# Patient Record
Sex: Female | Born: 1945
Health system: Southern US, Community
[De-identification: ages and names within clinical notes are randomized; demographics above are authoritative.]

## PROBLEM LIST (undated history)

## (undated) DIAGNOSIS — Z5189 Encounter for other specified aftercare: Secondary | ICD-10-CM

## (undated) DIAGNOSIS — F32A Depression, unspecified: Secondary | ICD-10-CM

## (undated) DIAGNOSIS — M797 Fibromyalgia: Secondary | ICD-10-CM

## (undated) DIAGNOSIS — I889 Nonspecific lymphadenitis, unspecified: Secondary | ICD-10-CM

## (undated) DIAGNOSIS — I447 Left bundle-branch block, unspecified: Secondary | ICD-10-CM

## (undated) DIAGNOSIS — I2510 Atherosclerotic heart disease of native coronary artery without angina pectoris: Secondary | ICD-10-CM

## (undated) DIAGNOSIS — M545 Low back pain, unspecified: Secondary | ICD-10-CM

## (undated) DIAGNOSIS — M1990 Unspecified osteoarthritis, unspecified site: Secondary | ICD-10-CM

## (undated) DIAGNOSIS — I519 Heart disease, unspecified: Secondary | ICD-10-CM

## (undated) DIAGNOSIS — M719 Bursopathy, unspecified: Secondary | ICD-10-CM

## (undated) DIAGNOSIS — I5189 Other ill-defined heart diseases: Secondary | ICD-10-CM

## (undated) DIAGNOSIS — Z7901 Long term (current) use of anticoagulants: Secondary | ICD-10-CM

## (undated) DIAGNOSIS — M51369 Other intervertebral disc degeneration, lumbar region without mention of lumbar back pain or lower extremity pain: Secondary | ICD-10-CM

## (undated) DIAGNOSIS — R51 Headache: Secondary | ICD-10-CM

## (undated) DIAGNOSIS — I480 Paroxysmal atrial fibrillation: Secondary | ICD-10-CM

## (undated) DIAGNOSIS — I639 Cerebral infarction, unspecified: Secondary | ICD-10-CM

## (undated) DIAGNOSIS — K219 Gastro-esophageal reflux disease without esophagitis: Secondary | ICD-10-CM

## (undated) DIAGNOSIS — E876 Hypokalemia: Secondary | ICD-10-CM

## (undated) DIAGNOSIS — F329 Major depressive disorder, single episode, unspecified: Secondary | ICD-10-CM

## (undated) DIAGNOSIS — E785 Hyperlipidemia, unspecified: Secondary | ICD-10-CM

## (undated) DIAGNOSIS — I10 Essential (primary) hypertension: Secondary | ICD-10-CM

## (undated) DIAGNOSIS — M5136 Other intervertebral disc degeneration, lumbar region: Secondary | ICD-10-CM

## (undated) HISTORY — DX: Low back pain, unspecified: M54.50

## (undated) HISTORY — DX: Left bundle-branch block, unspecified: I44.7

## (undated) HISTORY — DX: Other intervertebral disc degeneration, lumbar region without mention of lumbar back pain or lower extremity pain: M51.369

## (undated) HISTORY — DX: Encounter for other specified aftercare: Z51.89

## (undated) HISTORY — DX: Low back pain: M54.5

## (undated) HISTORY — DX: Morbid (severe) obesity due to excess calories: E66.01

## (undated) HISTORY — PX: CHOLECYSTECTOMY: SHX55

## (undated) HISTORY — DX: Hyperlipidemia, unspecified: E78.5

## (undated) HISTORY — DX: Paroxysmal atrial fibrillation: I48.0

## (undated) HISTORY — DX: Headache: R51

## (undated) HISTORY — DX: Long term (current) use of anticoagulants: Z79.01

## (undated) HISTORY — DX: Essential (primary) hypertension: I10

## (undated) HISTORY — DX: Atherosclerotic heart disease of native coronary artery without angina pectoris: I25.10

## (undated) HISTORY — DX: Heart disease, unspecified: I51.9

## (undated) HISTORY — DX: Depression, unspecified: F32.A

## (undated) HISTORY — DX: Hypokalemia: E87.6

## (undated) HISTORY — DX: Other ill-defined heart diseases: I51.89

## (undated) HISTORY — DX: Other intervertebral disc degeneration, lumbar region: M51.36

## (undated) HISTORY — PX: KNEE ARTHROSCOPY: SUR90

## (undated) HISTORY — PX: ABDOMINAL HYSTERECTOMY: SHX81

## (undated) HISTORY — DX: Bursopathy, unspecified: M71.9

## (undated) HISTORY — PX: CORONARY ARTERY BYPASS GRAFT: SHX141

## (undated) HISTORY — DX: Fibromyalgia: M79.7

## (undated) HISTORY — DX: Major depressive disorder, single episode, unspecified: F32.9

## (undated) HISTORY — PX: SPHINCTEROTOMY: SHX5279

## (undated) HISTORY — DX: Unspecified osteoarthritis, unspecified site: M19.90

## (undated) HISTORY — DX: Nonspecific lymphadenitis, unspecified: I88.9

## (undated) HISTORY — PX: TONSILLECTOMY: SUR1361

## (undated) HISTORY — DX: Gastro-esophageal reflux disease without esophagitis: K21.9

## (undated) HISTORY — PX: ANGIOPLASTY: SHX39

## (undated) HISTORY — PX: LUMBAR LAMINECTOMY: SHX95

---

## 1997-06-23 ENCOUNTER — Ambulatory Visit (HOSPITAL_COMMUNITY): Admission: RE | Admit: 1997-06-23 | Discharge: 1997-06-23 | Payer: Self-pay | Admitting: Internal Medicine

## 1997-07-31 ENCOUNTER — Inpatient Hospital Stay (HOSPITAL_COMMUNITY): Admission: EM | Admit: 1997-07-31 | Discharge: 1997-08-02 | Payer: Self-pay | Admitting: Emergency Medicine

## 1997-10-02 ENCOUNTER — Ambulatory Visit: Admission: RE | Admit: 1997-10-02 | Discharge: 1997-10-02 | Payer: Self-pay | Admitting: Internal Medicine

## 1997-11-21 ENCOUNTER — Ambulatory Visit (HOSPITAL_COMMUNITY): Admission: RE | Admit: 1997-11-21 | Discharge: 1997-11-21 | Payer: Self-pay | Admitting: Neurosurgery

## 1997-11-21 ENCOUNTER — Encounter: Payer: Self-pay | Admitting: Neurosurgery

## 1997-11-21 ENCOUNTER — Ambulatory Visit (HOSPITAL_COMMUNITY): Admission: RE | Admit: 1997-11-21 | Discharge: 1997-11-21 | Payer: Self-pay | Admitting: Orthopedic Surgery

## 1997-12-12 ENCOUNTER — Ambulatory Visit (HOSPITAL_COMMUNITY): Admission: RE | Admit: 1997-12-12 | Discharge: 1997-12-12 | Payer: Self-pay | Admitting: Neurosurgery

## 1998-02-11 ENCOUNTER — Encounter: Payer: Self-pay | Admitting: Neurosurgery

## 1998-02-11 ENCOUNTER — Ambulatory Visit (HOSPITAL_COMMUNITY): Admission: RE | Admit: 1998-02-11 | Discharge: 1998-02-11 | Payer: Self-pay | Admitting: Neurosurgery

## 1998-03-19 ENCOUNTER — Encounter: Payer: Self-pay | Admitting: Neurosurgery

## 1998-03-19 ENCOUNTER — Ambulatory Visit (HOSPITAL_COMMUNITY): Admission: RE | Admit: 1998-03-19 | Discharge: 1998-03-19 | Payer: Self-pay | Admitting: Neurosurgery

## 1998-05-28 ENCOUNTER — Observation Stay (HOSPITAL_COMMUNITY): Admission: EM | Admit: 1998-05-28 | Discharge: 1998-05-29 | Payer: Self-pay | Admitting: Emergency Medicine

## 1998-05-28 ENCOUNTER — Encounter: Payer: Self-pay | Admitting: Emergency Medicine

## 1998-06-03 ENCOUNTER — Encounter: Payer: Self-pay | Admitting: Neurosurgery

## 1998-06-05 ENCOUNTER — Inpatient Hospital Stay (HOSPITAL_COMMUNITY): Admission: RE | Admit: 1998-06-05 | Discharge: 1998-06-08 | Payer: Self-pay | Admitting: Neurosurgery

## 1998-06-05 ENCOUNTER — Encounter: Payer: Self-pay | Admitting: Neurosurgery

## 1998-06-06 ENCOUNTER — Encounter: Payer: Self-pay | Admitting: Neurosurgery

## 1998-12-01 ENCOUNTER — Encounter: Admission: RE | Admit: 1998-12-01 | Discharge: 1999-01-25 | Payer: Self-pay | Admitting: Neurosurgery

## 1998-12-31 ENCOUNTER — Encounter: Admission: RE | Admit: 1998-12-31 | Discharge: 1998-12-31 | Payer: Self-pay | Admitting: Neurosurgery

## 1998-12-31 ENCOUNTER — Encounter: Payer: Self-pay | Admitting: Neurosurgery

## 1999-02-08 ENCOUNTER — Other Ambulatory Visit: Admission: RE | Admit: 1999-02-08 | Discharge: 1999-02-08 | Payer: Self-pay | Admitting: Internal Medicine

## 1999-06-16 ENCOUNTER — Encounter: Payer: Self-pay | Admitting: Neurosurgery

## 1999-06-16 ENCOUNTER — Ambulatory Visit (HOSPITAL_COMMUNITY): Admission: RE | Admit: 1999-06-16 | Discharge: 1999-06-16 | Payer: Self-pay | Admitting: Neurosurgery

## 1999-07-06 ENCOUNTER — Encounter: Payer: Self-pay | Admitting: Internal Medicine

## 1999-07-06 ENCOUNTER — Ambulatory Visit (HOSPITAL_COMMUNITY): Admission: RE | Admit: 1999-07-06 | Discharge: 1999-07-06 | Payer: Self-pay | Admitting: Neurosurgery

## 1999-07-06 ENCOUNTER — Encounter: Payer: Self-pay | Admitting: Neurosurgery

## 1999-07-06 ENCOUNTER — Ambulatory Visit (HOSPITAL_COMMUNITY): Admission: RE | Admit: 1999-07-06 | Discharge: 1999-07-06 | Payer: Self-pay | Admitting: Internal Medicine

## 1999-08-03 ENCOUNTER — Ambulatory Visit (HOSPITAL_COMMUNITY): Admission: RE | Admit: 1999-08-03 | Discharge: 1999-08-03 | Payer: Self-pay | Admitting: Neurosurgery

## 1999-08-19 ENCOUNTER — Ambulatory Visit (HOSPITAL_COMMUNITY): Admission: RE | Admit: 1999-08-19 | Discharge: 1999-08-19 | Payer: Self-pay | Admitting: Neurosurgery

## 1999-08-19 ENCOUNTER — Encounter: Payer: Self-pay | Admitting: Neurosurgery

## 1999-09-17 ENCOUNTER — Encounter: Payer: Self-pay | Admitting: Orthopedic Surgery

## 1999-09-17 ENCOUNTER — Ambulatory Visit (HOSPITAL_COMMUNITY): Admission: RE | Admit: 1999-09-17 | Discharge: 1999-09-17 | Payer: Self-pay | Admitting: Orthopedic Surgery

## 1999-11-06 ENCOUNTER — Encounter: Payer: Self-pay | Admitting: Emergency Medicine

## 1999-11-06 ENCOUNTER — Emergency Department (HOSPITAL_COMMUNITY): Admission: EM | Admit: 1999-11-06 | Discharge: 1999-11-06 | Payer: Self-pay | Admitting: Emergency Medicine

## 2000-01-04 ENCOUNTER — Emergency Department (HOSPITAL_COMMUNITY): Admission: EM | Admit: 2000-01-04 | Discharge: 2000-01-04 | Payer: Self-pay

## 2000-01-04 ENCOUNTER — Encounter: Payer: Self-pay | Admitting: Emergency Medicine

## 2000-04-27 ENCOUNTER — Emergency Department (HOSPITAL_COMMUNITY): Admission: EM | Admit: 2000-04-27 | Discharge: 2000-04-27 | Payer: Self-pay | Admitting: Emergency Medicine

## 2000-05-12 ENCOUNTER — Ambulatory Visit (HOSPITAL_COMMUNITY): Admission: RE | Admit: 2000-05-12 | Discharge: 2000-05-12 | Payer: Self-pay | Admitting: Anesthesiology

## 2000-05-12 ENCOUNTER — Encounter: Payer: Self-pay | Admitting: Anesthesiology

## 2000-07-14 ENCOUNTER — Emergency Department (HOSPITAL_COMMUNITY): Admission: EM | Admit: 2000-07-14 | Discharge: 2000-07-14 | Payer: Self-pay | Admitting: Emergency Medicine

## 2000-08-01 ENCOUNTER — Ambulatory Visit (HOSPITAL_COMMUNITY): Admission: RE | Admit: 2000-08-01 | Discharge: 2000-08-01 | Payer: Self-pay | Admitting: Family Medicine

## 2000-10-20 ENCOUNTER — Ambulatory Visit (HOSPITAL_COMMUNITY): Admission: RE | Admit: 2000-10-20 | Discharge: 2000-10-20 | Payer: Self-pay | Admitting: Otolaryngology

## 2000-10-20 ENCOUNTER — Encounter: Payer: Self-pay | Admitting: Otolaryngology

## 2000-11-21 ENCOUNTER — Ambulatory Visit (HOSPITAL_COMMUNITY): Admission: RE | Admit: 2000-11-21 | Discharge: 2000-11-21 | Payer: Self-pay | Admitting: Neurosurgery

## 2000-11-21 ENCOUNTER — Encounter: Payer: Self-pay | Admitting: Neurosurgery

## 2000-11-27 ENCOUNTER — Ambulatory Visit (HOSPITAL_BASED_OUTPATIENT_CLINIC_OR_DEPARTMENT_OTHER): Admission: RE | Admit: 2000-11-27 | Discharge: 2000-11-27 | Payer: Self-pay | Admitting: Orthopedic Surgery

## 2000-12-08 ENCOUNTER — Encounter: Admission: RE | Admit: 2000-12-08 | Discharge: 2001-01-12 | Payer: Self-pay | Admitting: Orthopedic Surgery

## 2001-06-04 ENCOUNTER — Emergency Department (HOSPITAL_COMMUNITY): Admission: EM | Admit: 2001-06-04 | Discharge: 2001-06-04 | Payer: Self-pay | Admitting: Emergency Medicine

## 2001-06-04 ENCOUNTER — Encounter: Payer: Self-pay | Admitting: Emergency Medicine

## 2001-06-07 ENCOUNTER — Inpatient Hospital Stay (HOSPITAL_COMMUNITY): Admission: AD | Admit: 2001-06-07 | Discharge: 2001-06-09 | Payer: Self-pay | Admitting: Cardiology

## 2001-06-07 ENCOUNTER — Encounter: Payer: Self-pay | Admitting: Cardiology

## 2001-10-01 ENCOUNTER — Encounter: Payer: Self-pay | Admitting: Emergency Medicine

## 2001-10-01 ENCOUNTER — Inpatient Hospital Stay (HOSPITAL_COMMUNITY): Admission: EM | Admit: 2001-10-01 | Discharge: 2001-10-03 | Payer: Self-pay | Admitting: Emergency Medicine

## 2001-10-24 ENCOUNTER — Inpatient Hospital Stay (HOSPITAL_COMMUNITY): Admission: AD | Admit: 2001-10-24 | Discharge: 2001-10-26 | Payer: Self-pay | Admitting: Cardiology

## 2001-10-24 ENCOUNTER — Encounter: Payer: Self-pay | Admitting: Cardiology

## 2001-12-03 ENCOUNTER — Encounter (HOSPITAL_COMMUNITY): Admission: RE | Admit: 2001-12-03 | Discharge: 2002-01-23 | Payer: Self-pay | Admitting: Cardiology

## 2002-03-07 DIAGNOSIS — I639 Cerebral infarction, unspecified: Secondary | ICD-10-CM

## 2002-03-07 HISTORY — DX: Cerebral infarction, unspecified: I63.9

## 2002-04-19 ENCOUNTER — Encounter: Payer: Self-pay | Admitting: Cardiology

## 2002-04-19 ENCOUNTER — Ambulatory Visit (HOSPITAL_COMMUNITY): Admission: RE | Admit: 2002-04-19 | Discharge: 2002-04-20 | Payer: Self-pay | Admitting: Cardiology

## 2002-05-03 ENCOUNTER — Inpatient Hospital Stay (HOSPITAL_COMMUNITY): Admission: RE | Admit: 2002-05-03 | Discharge: 2002-05-09 | Payer: Self-pay | Admitting: Surgery

## 2002-05-03 ENCOUNTER — Encounter: Payer: Self-pay | Admitting: Surgery

## 2002-05-04 ENCOUNTER — Encounter: Payer: Self-pay | Admitting: Surgery

## 2002-05-04 ENCOUNTER — Encounter: Payer: Self-pay | Admitting: Thoracic Surgery (Cardiothoracic Vascular Surgery)

## 2002-05-05 ENCOUNTER — Encounter: Payer: Self-pay | Admitting: Thoracic Surgery (Cardiothoracic Vascular Surgery)

## 2002-05-08 ENCOUNTER — Encounter: Payer: Self-pay | Admitting: Thoracic Surgery (Cardiothoracic Vascular Surgery)

## 2002-05-12 ENCOUNTER — Encounter: Payer: Self-pay | Admitting: Emergency Medicine

## 2002-05-12 ENCOUNTER — Emergency Department (HOSPITAL_COMMUNITY): Admission: EM | Admit: 2002-05-12 | Discharge: 2002-05-12 | Payer: Self-pay | Admitting: Emergency Medicine

## 2002-05-27 ENCOUNTER — Encounter (HOSPITAL_COMMUNITY): Admission: RE | Admit: 2002-05-27 | Discharge: 2002-07-10 | Payer: Self-pay | Admitting: Cardiology

## 2002-07-11 ENCOUNTER — Encounter (HOSPITAL_COMMUNITY): Admission: RE | Admit: 2002-07-11 | Discharge: 2002-08-11 | Payer: Self-pay | Admitting: Cardiology

## 2002-07-12 ENCOUNTER — Inpatient Hospital Stay (HOSPITAL_COMMUNITY): Admission: EM | Admit: 2002-07-12 | Discharge: 2002-07-18 | Payer: Self-pay | Admitting: Emergency Medicine

## 2002-07-12 ENCOUNTER — Encounter: Payer: Self-pay | Admitting: Emergency Medicine

## 2002-07-12 ENCOUNTER — Encounter: Payer: Self-pay | Admitting: Neurology

## 2002-07-12 ENCOUNTER — Encounter: Payer: Self-pay | Admitting: Cardiology

## 2002-07-13 ENCOUNTER — Encounter: Payer: Self-pay | Admitting: Neurology

## 2002-07-15 ENCOUNTER — Encounter: Payer: Self-pay | Admitting: Internal Medicine

## 2002-07-31 ENCOUNTER — Encounter: Admission: RE | Admit: 2002-07-31 | Discharge: 2002-10-29 | Payer: Self-pay | Admitting: Neurology

## 2002-08-01 ENCOUNTER — Emergency Department (HOSPITAL_COMMUNITY): Admission: EM | Admit: 2002-08-01 | Discharge: 2002-08-02 | Payer: Self-pay

## 2002-08-02 ENCOUNTER — Encounter: Payer: Self-pay | Admitting: Emergency Medicine

## 2002-08-13 ENCOUNTER — Encounter (HOSPITAL_COMMUNITY): Admission: RE | Admit: 2002-08-13 | Discharge: 2002-11-11 | Payer: Self-pay | Admitting: Cardiology

## 2002-09-30 ENCOUNTER — Ambulatory Visit (HOSPITAL_COMMUNITY): Admission: RE | Admit: 2002-09-30 | Discharge: 2002-09-30 | Payer: Self-pay | Admitting: Internal Medicine

## 2002-10-23 ENCOUNTER — Emergency Department (HOSPITAL_COMMUNITY): Admission: EM | Admit: 2002-10-23 | Discharge: 2002-10-24 | Payer: Self-pay | Admitting: Emergency Medicine

## 2002-10-23 ENCOUNTER — Encounter: Payer: Self-pay | Admitting: Emergency Medicine

## 2002-10-31 ENCOUNTER — Ambulatory Visit (HOSPITAL_COMMUNITY): Admission: RE | Admit: 2002-10-31 | Discharge: 2002-10-31 | Payer: Self-pay | Admitting: Neurology

## 2002-12-28 ENCOUNTER — Encounter: Payer: Self-pay | Admitting: Emergency Medicine

## 2002-12-28 ENCOUNTER — Emergency Department (HOSPITAL_COMMUNITY): Admission: EM | Admit: 2002-12-28 | Discharge: 2002-12-28 | Payer: Self-pay | Admitting: *Deleted

## 2003-03-29 ENCOUNTER — Emergency Department (HOSPITAL_COMMUNITY): Admission: EM | Admit: 2003-03-29 | Discharge: 2003-03-29 | Payer: Self-pay | Admitting: Emergency Medicine

## 2003-04-28 ENCOUNTER — Ambulatory Visit (HOSPITAL_COMMUNITY): Admission: RE | Admit: 2003-04-28 | Discharge: 2003-04-28 | Payer: Self-pay | Admitting: Cardiology

## 2003-10-22 ENCOUNTER — Inpatient Hospital Stay (HOSPITAL_COMMUNITY): Admission: AD | Admit: 2003-10-22 | Discharge: 2003-10-25 | Payer: Self-pay | Admitting: Cardiology

## 2003-10-23 ENCOUNTER — Encounter: Payer: Self-pay | Admitting: Internal Medicine

## 2003-10-23 DIAGNOSIS — K219 Gastro-esophageal reflux disease without esophagitis: Secondary | ICD-10-CM

## 2003-10-23 HISTORY — DX: Gastro-esophageal reflux disease without esophagitis: K21.9

## 2003-11-11 ENCOUNTER — Encounter: Admission: RE | Admit: 2003-11-11 | Discharge: 2003-11-11 | Payer: Self-pay | Admitting: Occupational Medicine

## 2003-11-20 ENCOUNTER — Emergency Department (HOSPITAL_COMMUNITY): Admission: EM | Admit: 2003-11-20 | Discharge: 2003-11-20 | Payer: Self-pay | Admitting: Emergency Medicine

## 2003-11-21 ENCOUNTER — Emergency Department (HOSPITAL_COMMUNITY): Admission: EM | Admit: 2003-11-21 | Discharge: 2003-11-21 | Payer: Self-pay | Admitting: Emergency Medicine

## 2003-11-28 ENCOUNTER — Observation Stay (HOSPITAL_COMMUNITY): Admission: EM | Admit: 2003-11-28 | Discharge: 2003-11-29 | Payer: Self-pay | Admitting: Emergency Medicine

## 2003-11-30 ENCOUNTER — Emergency Department (HOSPITAL_COMMUNITY): Admission: EM | Admit: 2003-11-30 | Discharge: 2003-11-30 | Payer: Self-pay | Admitting: Emergency Medicine

## 2003-12-10 ENCOUNTER — Encounter: Admission: RE | Admit: 2003-12-10 | Discharge: 2004-03-09 | Payer: Self-pay | Admitting: Occupational Medicine

## 2004-01-08 ENCOUNTER — Ambulatory Visit: Payer: Self-pay | Admitting: Internal Medicine

## 2004-01-08 ENCOUNTER — Ambulatory Visit: Payer: Self-pay | Admitting: Cardiology

## 2004-01-23 ENCOUNTER — Ambulatory Visit (HOSPITAL_COMMUNITY): Admission: RE | Admit: 2004-01-23 | Discharge: 2004-01-23 | Payer: Self-pay | Admitting: Cardiology

## 2004-01-23 ENCOUNTER — Emergency Department (HOSPITAL_COMMUNITY): Admission: EM | Admit: 2004-01-23 | Discharge: 2004-01-23 | Payer: Self-pay | Admitting: Emergency Medicine

## 2004-01-27 ENCOUNTER — Ambulatory Visit: Payer: Self-pay

## 2004-02-05 ENCOUNTER — Ambulatory Visit: Payer: Self-pay | Admitting: Internal Medicine

## 2004-02-12 ENCOUNTER — Ambulatory Visit: Payer: Self-pay | Admitting: Cardiology

## 2004-02-17 ENCOUNTER — Ambulatory Visit (HOSPITAL_COMMUNITY): Admission: RE | Admit: 2004-02-17 | Discharge: 2004-02-18 | Payer: Self-pay | Admitting: Orthopedic Surgery

## 2004-02-25 ENCOUNTER — Ambulatory Visit: Payer: Self-pay | Admitting: Internal Medicine

## 2004-02-26 ENCOUNTER — Ambulatory Visit: Payer: Self-pay | Admitting: Cardiology

## 2004-03-05 ENCOUNTER — Ambulatory Visit: Payer: Self-pay | Admitting: Internal Medicine

## 2004-03-08 ENCOUNTER — Encounter: Admission: RE | Admit: 2004-03-08 | Discharge: 2004-06-06 | Payer: Self-pay | Admitting: Orthopedic Surgery

## 2004-03-23 ENCOUNTER — Ambulatory Visit: Payer: Self-pay | Admitting: Internal Medicine

## 2004-03-24 ENCOUNTER — Ambulatory Visit: Payer: Self-pay | Admitting: Cardiology

## 2004-04-05 ENCOUNTER — Ambulatory Visit: Payer: Self-pay | Admitting: Internal Medicine

## 2004-04-21 ENCOUNTER — Ambulatory Visit: Payer: Self-pay | Admitting: Internal Medicine

## 2004-05-04 ENCOUNTER — Ambulatory Visit: Payer: Self-pay | Admitting: *Deleted

## 2004-05-21 ENCOUNTER — Ambulatory Visit: Payer: Self-pay | Admitting: Internal Medicine

## 2004-05-25 ENCOUNTER — Ambulatory Visit: Payer: Self-pay | Admitting: Cardiology

## 2004-05-27 ENCOUNTER — Ambulatory Visit: Payer: Self-pay | Admitting: Cardiology

## 2004-06-14 ENCOUNTER — Ambulatory Visit: Payer: Self-pay | Admitting: Cardiology

## 2004-06-23 ENCOUNTER — Ambulatory Visit: Payer: Self-pay | Admitting: Internal Medicine

## 2004-06-24 ENCOUNTER — Ambulatory Visit: Payer: Self-pay | Admitting: Cardiology

## 2004-07-02 ENCOUNTER — Ambulatory Visit: Payer: Self-pay | Admitting: Internal Medicine

## 2004-07-12 ENCOUNTER — Ambulatory Visit: Payer: Self-pay | Admitting: *Deleted

## 2004-08-03 ENCOUNTER — Ambulatory Visit: Payer: Self-pay | Admitting: *Deleted

## 2004-08-09 ENCOUNTER — Ambulatory Visit: Payer: Self-pay | Admitting: Cardiology

## 2004-08-10 ENCOUNTER — Ambulatory Visit: Payer: Self-pay | Admitting: Internal Medicine

## 2004-08-16 ENCOUNTER — Ambulatory Visit: Payer: Self-pay | Admitting: Cardiology

## 2004-08-17 ENCOUNTER — Ambulatory Visit: Payer: Self-pay | Admitting: Internal Medicine

## 2004-08-18 ENCOUNTER — Encounter: Admission: RE | Admit: 2004-08-18 | Discharge: 2004-08-18 | Payer: Self-pay | Admitting: Neurosurgery

## 2004-08-18 ENCOUNTER — Ambulatory Visit: Payer: Self-pay | Admitting: *Deleted

## 2004-08-21 ENCOUNTER — Ambulatory Visit (HOSPITAL_COMMUNITY): Admission: RE | Admit: 2004-08-21 | Discharge: 2004-08-21 | Payer: Self-pay | Admitting: Orthopedic Surgery

## 2004-08-25 ENCOUNTER — Ambulatory Visit: Payer: Self-pay | Admitting: *Deleted

## 2004-09-01 ENCOUNTER — Ambulatory Visit: Payer: Self-pay | Admitting: Cardiology

## 2004-09-03 ENCOUNTER — Ambulatory Visit: Payer: Self-pay | Admitting: Licensed Clinical Social Worker

## 2004-09-10 ENCOUNTER — Ambulatory Visit: Payer: Self-pay | Admitting: Cardiology

## 2004-09-13 ENCOUNTER — Ambulatory Visit: Payer: Self-pay | Admitting: Cardiology

## 2004-09-21 ENCOUNTER — Ambulatory Visit: Payer: Self-pay | Admitting: Cardiology

## 2004-09-21 ENCOUNTER — Ambulatory Visit: Payer: Self-pay

## 2004-09-24 ENCOUNTER — Ambulatory Visit: Payer: Self-pay | Admitting: Licensed Clinical Social Worker

## 2004-09-28 ENCOUNTER — Ambulatory Visit: Payer: Self-pay | Admitting: Cardiology

## 2004-10-12 ENCOUNTER — Ambulatory Visit: Payer: Self-pay | Admitting: Cardiology

## 2004-10-19 ENCOUNTER — Ambulatory Visit: Payer: Self-pay | Admitting: Cardiology

## 2004-10-21 ENCOUNTER — Ambulatory Visit: Payer: Self-pay | Admitting: Internal Medicine

## 2004-10-21 ENCOUNTER — Encounter: Admission: RE | Admit: 2004-10-21 | Discharge: 2004-10-21 | Payer: Self-pay | Admitting: Neurosurgery

## 2004-10-26 ENCOUNTER — Encounter: Admission: RE | Admit: 2004-10-26 | Discharge: 2004-10-26 | Payer: Self-pay | Admitting: Internal Medicine

## 2004-10-28 ENCOUNTER — Ambulatory Visit: Payer: Self-pay | Admitting: Cardiology

## 2004-11-03 ENCOUNTER — Ambulatory Visit: Payer: Self-pay | Admitting: *Deleted

## 2004-11-05 ENCOUNTER — Ambulatory Visit: Payer: Self-pay | Admitting: Cardiology

## 2004-11-05 ENCOUNTER — Encounter: Admission: RE | Admit: 2004-11-05 | Discharge: 2004-11-05 | Payer: Self-pay | Admitting: Neurosurgery

## 2004-11-09 ENCOUNTER — Ambulatory Visit: Payer: Self-pay | Admitting: Internal Medicine

## 2004-11-09 ENCOUNTER — Ambulatory Visit: Payer: Self-pay | Admitting: Licensed Clinical Social Worker

## 2004-11-11 ENCOUNTER — Ambulatory Visit: Payer: Self-pay | Admitting: Cardiology

## 2004-11-11 ENCOUNTER — Ambulatory Visit: Payer: Self-pay | Admitting: Internal Medicine

## 2004-11-19 ENCOUNTER — Ambulatory Visit: Payer: Self-pay | Admitting: Licensed Clinical Social Worker

## 2004-11-22 ENCOUNTER — Ambulatory Visit: Payer: Self-pay

## 2004-11-22 ENCOUNTER — Ambulatory Visit: Payer: Self-pay | Admitting: Cardiology

## 2004-11-26 ENCOUNTER — Ambulatory Visit: Payer: Self-pay | Admitting: Cardiology

## 2004-12-15 ENCOUNTER — Ambulatory Visit: Payer: Self-pay | Admitting: Cardiology

## 2005-01-11 ENCOUNTER — Ambulatory Visit: Payer: Self-pay | Admitting: Internal Medicine

## 2005-01-12 ENCOUNTER — Ambulatory Visit: Payer: Self-pay | Admitting: Cardiology

## 2005-02-10 ENCOUNTER — Ambulatory Visit: Payer: Self-pay | Admitting: Cardiology

## 2005-03-10 ENCOUNTER — Ambulatory Visit: Payer: Self-pay | Admitting: Internal Medicine

## 2005-03-15 ENCOUNTER — Ambulatory Visit: Payer: Self-pay | Admitting: Cardiology

## 2005-03-16 ENCOUNTER — Ambulatory Visit: Payer: Self-pay | Admitting: Internal Medicine

## 2005-03-17 ENCOUNTER — Ambulatory Visit: Payer: Self-pay | Admitting: Cardiology

## 2005-04-06 ENCOUNTER — Other Ambulatory Visit: Admission: RE | Admit: 2005-04-06 | Discharge: 2005-04-06 | Payer: Self-pay | Admitting: *Deleted

## 2005-04-07 ENCOUNTER — Ambulatory Visit: Payer: Self-pay | Admitting: Cardiology

## 2005-04-11 ENCOUNTER — Encounter: Admission: RE | Admit: 2005-04-11 | Discharge: 2005-04-11 | Payer: Self-pay | Admitting: *Deleted

## 2005-05-01 ENCOUNTER — Emergency Department (HOSPITAL_COMMUNITY): Admission: EM | Admit: 2005-05-01 | Discharge: 2005-05-01 | Payer: Self-pay | Admitting: Emergency Medicine

## 2005-05-03 ENCOUNTER — Ambulatory Visit: Payer: Self-pay | Admitting: Internal Medicine

## 2005-05-04 ENCOUNTER — Ambulatory Visit (HOSPITAL_COMMUNITY): Admission: RE | Admit: 2005-05-04 | Discharge: 2005-05-04 | Payer: Self-pay | Admitting: Internal Medicine

## 2005-05-05 ENCOUNTER — Ambulatory Visit: Payer: Self-pay | Admitting: Cardiology

## 2005-05-05 ENCOUNTER — Encounter: Admission: RE | Admit: 2005-05-05 | Discharge: 2005-05-05 | Payer: Self-pay | Admitting: Neurosurgery

## 2005-05-11 ENCOUNTER — Ambulatory Visit: Payer: Self-pay | Admitting: Internal Medicine

## 2005-05-13 ENCOUNTER — Ambulatory Visit: Payer: Self-pay | Admitting: Internal Medicine

## 2005-05-19 ENCOUNTER — Ambulatory Visit: Payer: Self-pay | Admitting: Cardiology

## 2005-06-02 ENCOUNTER — Ambulatory Visit: Payer: Self-pay | Admitting: Cardiology

## 2005-06-03 ENCOUNTER — Ambulatory Visit: Payer: Self-pay | Admitting: Cardiology

## 2005-06-13 ENCOUNTER — Ambulatory Visit: Payer: Self-pay | Admitting: Internal Medicine

## 2005-06-16 ENCOUNTER — Ambulatory Visit: Payer: Self-pay

## 2005-06-16 ENCOUNTER — Ambulatory Visit: Payer: Self-pay | Admitting: Internal Medicine

## 2005-06-23 ENCOUNTER — Ambulatory Visit (HOSPITAL_BASED_OUTPATIENT_CLINIC_OR_DEPARTMENT_OTHER): Admission: RE | Admit: 2005-06-23 | Discharge: 2005-06-23 | Payer: Self-pay | Admitting: Cardiology

## 2005-06-28 ENCOUNTER — Ambulatory Visit: Payer: Self-pay | Admitting: Cardiovascular Disease

## 2005-06-28 ENCOUNTER — Ambulatory Visit: Payer: Self-pay | Admitting: Cardiology

## 2005-07-06 ENCOUNTER — Encounter: Payer: Self-pay | Admitting: Neurosurgery

## 2005-07-12 ENCOUNTER — Ambulatory Visit: Payer: Self-pay | Admitting: Internal Medicine

## 2005-07-12 ENCOUNTER — Ambulatory Visit: Payer: Self-pay | Admitting: Pulmonary Disease

## 2005-07-19 ENCOUNTER — Ambulatory Visit: Payer: Self-pay | Admitting: Cardiology

## 2005-07-21 ENCOUNTER — Encounter: Admission: RE | Admit: 2005-07-21 | Discharge: 2005-07-21 | Payer: Self-pay | Admitting: Internal Medicine

## 2005-08-02 ENCOUNTER — Ambulatory Visit: Payer: Self-pay | Admitting: Internal Medicine

## 2005-08-16 ENCOUNTER — Ambulatory Visit: Payer: Self-pay | Admitting: *Deleted

## 2005-08-16 ENCOUNTER — Ambulatory Visit: Payer: Self-pay | Admitting: Internal Medicine

## 2005-09-06 ENCOUNTER — Ambulatory Visit: Payer: Self-pay | Admitting: Internal Medicine

## 2005-09-21 ENCOUNTER — Encounter: Admission: RE | Admit: 2005-09-21 | Discharge: 2005-09-21 | Payer: Self-pay | Admitting: General Surgery

## 2005-09-27 ENCOUNTER — Ambulatory Visit: Payer: Self-pay | Admitting: *Deleted

## 2005-09-27 ENCOUNTER — Ambulatory Visit: Payer: Self-pay | Admitting: Cardiology

## 2005-10-11 ENCOUNTER — Ambulatory Visit: Payer: Self-pay | Admitting: Cardiology

## 2005-11-01 ENCOUNTER — Ambulatory Visit: Payer: Self-pay | Admitting: Internal Medicine

## 2005-11-14 ENCOUNTER — Ambulatory Visit: Payer: Self-pay | Admitting: Internal Medicine

## 2005-12-20 ENCOUNTER — Ambulatory Visit: Payer: Self-pay | Admitting: Cardiology

## 2006-01-03 ENCOUNTER — Ambulatory Visit: Payer: Self-pay | Admitting: Cardiology

## 2006-01-06 ENCOUNTER — Ambulatory Visit: Payer: Self-pay | Admitting: Internal Medicine

## 2006-02-07 ENCOUNTER — Ambulatory Visit: Payer: Self-pay | Admitting: Cardiology

## 2006-02-21 ENCOUNTER — Ambulatory Visit: Payer: Self-pay | Admitting: Cardiology

## 2006-03-08 ENCOUNTER — Ambulatory Visit: Payer: Self-pay | Admitting: *Deleted

## 2006-03-21 ENCOUNTER — Ambulatory Visit: Payer: Self-pay | Admitting: Cardiology

## 2006-03-29 ENCOUNTER — Other Ambulatory Visit: Admission: RE | Admit: 2006-03-29 | Discharge: 2006-03-29 | Payer: Self-pay | Admitting: *Deleted

## 2006-04-05 ENCOUNTER — Ambulatory Visit: Payer: Self-pay | Admitting: Internal Medicine

## 2006-04-05 LAB — CONVERTED CEMR LAB
Creatinine, Ser: 0.9 mg/dL (ref 0.4–1.2)
Hgb A1c MFr Bld: 7.4 % — ABNORMAL HIGH (ref 4.6–6.0)
Potassium: 4.2 meq/L (ref 3.5–5.1)

## 2006-04-11 ENCOUNTER — Encounter: Admission: RE | Admit: 2006-04-11 | Discharge: 2006-04-11 | Payer: Self-pay | Admitting: *Deleted

## 2006-04-11 ENCOUNTER — Ambulatory Visit: Payer: Self-pay | Admitting: Cardiology

## 2006-04-17 LAB — HM DEXA SCAN: HM DEXA SCAN: NORMAL

## 2006-04-28 ENCOUNTER — Ambulatory Visit: Payer: Self-pay | Admitting: Internal Medicine

## 2006-05-22 ENCOUNTER — Ambulatory Visit: Payer: Self-pay | Admitting: Internal Medicine

## 2006-06-08 ENCOUNTER — Ambulatory Visit: Payer: Self-pay | Admitting: Internal Medicine

## 2006-07-13 ENCOUNTER — Ambulatory Visit: Payer: Self-pay | Admitting: Internal Medicine

## 2006-08-14 ENCOUNTER — Ambulatory Visit: Payer: Self-pay | Admitting: Internal Medicine

## 2006-08-14 ENCOUNTER — Ambulatory Visit: Payer: Self-pay | Admitting: Cardiology

## 2006-08-14 LAB — CONVERTED CEMR LAB
BUN: 21 mg/dL (ref 6–23)
Calcium: 9.4 mg/dL (ref 8.4–10.5)
Chloride: 103 meq/L (ref 96–112)
Creatinine, Ser: 0.8 mg/dL (ref 0.4–1.2)
GFR calc non Af Amer: 78 mL/min
INR: 2.9 — ABNORMAL HIGH (ref 0.9–2.0)
Potassium: 4 meq/L (ref 3.5–5.1)
Prothrombin Time: 21.6 s — ABNORMAL HIGH (ref 10.0–14.0)

## 2006-08-24 ENCOUNTER — Emergency Department (HOSPITAL_COMMUNITY): Admission: EM | Admit: 2006-08-24 | Discharge: 2006-08-25 | Payer: Self-pay | Admitting: *Deleted

## 2006-08-30 DIAGNOSIS — E1159 Type 2 diabetes mellitus with other circulatory complications: Secondary | ICD-10-CM | POA: Insufficient documentation

## 2006-08-30 DIAGNOSIS — I10 Essential (primary) hypertension: Secondary | ICD-10-CM

## 2006-09-02 ENCOUNTER — Ambulatory Visit: Payer: Self-pay | Admitting: *Deleted

## 2006-09-02 ENCOUNTER — Emergency Department (HOSPITAL_COMMUNITY): Admission: EM | Admit: 2006-09-02 | Discharge: 2006-09-03 | Payer: Self-pay | Admitting: Emergency Medicine

## 2006-09-13 ENCOUNTER — Ambulatory Visit: Payer: Self-pay | Admitting: Internal Medicine

## 2006-09-13 ENCOUNTER — Encounter: Payer: Self-pay | Admitting: Internal Medicine

## 2006-09-13 DIAGNOSIS — E1169 Type 2 diabetes mellitus with other specified complication: Secondary | ICD-10-CM | POA: Insufficient documentation

## 2006-09-13 DIAGNOSIS — K219 Gastro-esophageal reflux disease without esophagitis: Secondary | ICD-10-CM | POA: Insufficient documentation

## 2006-09-13 DIAGNOSIS — E785 Hyperlipidemia, unspecified: Secondary | ICD-10-CM

## 2006-09-13 DIAGNOSIS — I2510 Atherosclerotic heart disease of native coronary artery without angina pectoris: Secondary | ICD-10-CM

## 2006-09-13 DIAGNOSIS — I480 Paroxysmal atrial fibrillation: Secondary | ICD-10-CM | POA: Insufficient documentation

## 2006-09-15 ENCOUNTER — Ambulatory Visit: Payer: Self-pay | Admitting: Cardiology

## 2006-10-03 ENCOUNTER — Ambulatory Visit: Payer: Self-pay | Admitting: Internal Medicine

## 2006-10-03 DIAGNOSIS — M719 Bursopathy, unspecified: Secondary | ICD-10-CM

## 2006-10-03 DIAGNOSIS — M67919 Unspecified disorder of synovium and tendon, unspecified shoulder: Secondary | ICD-10-CM | POA: Insufficient documentation

## 2006-10-09 ENCOUNTER — Telehealth: Payer: Self-pay | Admitting: Internal Medicine

## 2006-10-17 ENCOUNTER — Ambulatory Visit: Payer: Self-pay | Admitting: Cardiology

## 2006-11-22 ENCOUNTER — Ambulatory Visit: Payer: Self-pay | Admitting: Internal Medicine

## 2006-11-22 DIAGNOSIS — M1990 Unspecified osteoarthritis, unspecified site: Secondary | ICD-10-CM

## 2006-11-22 LAB — CONVERTED CEMR LAB
BUN: 18 mg/dL (ref 6–23)
CO2: 27 meq/L (ref 19–32)
INR: 4.3
Prothrombin Time: 25 s
Sodium: 142 meq/L (ref 135–145)

## 2006-12-11 ENCOUNTER — Ambulatory Visit: Payer: Self-pay | Admitting: Internal Medicine

## 2006-12-20 ENCOUNTER — Telehealth: Payer: Self-pay | Admitting: Internal Medicine

## 2006-12-25 ENCOUNTER — Ambulatory Visit: Payer: Self-pay | Admitting: Internal Medicine

## 2006-12-29 ENCOUNTER — Telehealth: Payer: Self-pay | Admitting: *Deleted

## 2007-01-01 ENCOUNTER — Telehealth: Payer: Self-pay | Admitting: Internal Medicine

## 2007-01-02 ENCOUNTER — Telehealth: Payer: Self-pay | Admitting: *Deleted

## 2007-01-15 ENCOUNTER — Ambulatory Visit: Payer: Self-pay | Admitting: Internal Medicine

## 2007-01-18 ENCOUNTER — Telehealth (INDEPENDENT_AMBULATORY_CARE_PROVIDER_SITE_OTHER): Payer: Self-pay | Admitting: Family Medicine

## 2007-01-18 ENCOUNTER — Telehealth (INDEPENDENT_AMBULATORY_CARE_PROVIDER_SITE_OTHER): Payer: Self-pay | Admitting: *Deleted

## 2007-01-22 ENCOUNTER — Ambulatory Visit: Payer: Self-pay | Admitting: Internal Medicine

## 2007-01-22 LAB — CONVERTED CEMR LAB: VLDL: 24 mg/dL (ref 0–40)

## 2007-01-23 ENCOUNTER — Telehealth: Payer: Self-pay | Admitting: Internal Medicine

## 2007-01-25 ENCOUNTER — Encounter: Payer: Self-pay | Admitting: Internal Medicine

## 2007-01-29 ENCOUNTER — Ambulatory Visit: Payer: Self-pay | Admitting: Internal Medicine

## 2007-01-29 DIAGNOSIS — F329 Major depressive disorder, single episode, unspecified: Secondary | ICD-10-CM

## 2007-01-29 DIAGNOSIS — F068 Other specified mental disorders due to known physiological condition: Secondary | ICD-10-CM | POA: Insufficient documentation

## 2007-01-31 ENCOUNTER — Telehealth: Payer: Self-pay | Admitting: Internal Medicine

## 2007-02-02 ENCOUNTER — Telehealth: Payer: Self-pay | Admitting: Internal Medicine

## 2007-02-05 ENCOUNTER — Ambulatory Visit: Payer: Self-pay | Admitting: Internal Medicine

## 2007-02-05 ENCOUNTER — Telehealth: Payer: Self-pay | Admitting: Internal Medicine

## 2007-02-06 ENCOUNTER — Telehealth: Payer: Self-pay | Admitting: Internal Medicine

## 2007-02-09 ENCOUNTER — Telehealth: Payer: Self-pay | Admitting: *Deleted

## 2007-03-12 ENCOUNTER — Telehealth: Payer: Self-pay | Admitting: Internal Medicine

## 2007-03-30 ENCOUNTER — Ambulatory Visit: Payer: Self-pay | Admitting: Internal Medicine

## 2007-04-03 ENCOUNTER — Telehealth: Payer: Self-pay | Admitting: *Deleted

## 2007-04-04 ENCOUNTER — Ambulatory Visit: Payer: Self-pay | Admitting: Cardiology

## 2007-04-04 LAB — CONVERTED CEMR LAB
CO2: 26 meq/L (ref 19–32)
Chloride: 106 meq/L (ref 96–112)
Creatinine, Ser: 0.9 mg/dL (ref 0.4–1.2)
GFR calc Af Amer: 82 mL/min
GFR calc non Af Amer: 68 mL/min
Potassium: 4 meq/L (ref 3.5–5.1)

## 2007-04-10 ENCOUNTER — Telehealth (INDEPENDENT_AMBULATORY_CARE_PROVIDER_SITE_OTHER): Payer: Self-pay | Admitting: *Deleted

## 2007-04-23 ENCOUNTER — Telehealth: Payer: Self-pay | Admitting: Internal Medicine

## 2007-04-30 ENCOUNTER — Ambulatory Visit: Payer: Self-pay | Admitting: Internal Medicine

## 2007-04-30 DIAGNOSIS — R350 Frequency of micturition: Secondary | ICD-10-CM

## 2007-04-30 LAB — CONVERTED CEMR LAB: Hgb A1c MFr Bld: 8.3 % — ABNORMAL HIGH (ref 4.6–6.0)

## 2007-05-14 ENCOUNTER — Telehealth: Payer: Self-pay | Admitting: Internal Medicine

## 2007-05-15 ENCOUNTER — Telehealth: Payer: Self-pay | Admitting: Internal Medicine

## 2007-05-23 ENCOUNTER — Telehealth: Payer: Self-pay | Admitting: Internal Medicine

## 2007-06-26 ENCOUNTER — Ambulatory Visit: Payer: Self-pay | Admitting: Cardiology

## 2007-06-27 ENCOUNTER — Telehealth: Payer: Self-pay | Admitting: Internal Medicine

## 2007-07-02 ENCOUNTER — Ambulatory Visit: Payer: Self-pay | Admitting: Internal Medicine

## 2007-07-12 ENCOUNTER — Telehealth: Payer: Self-pay | Admitting: Internal Medicine

## 2007-07-17 ENCOUNTER — Telehealth (INDEPENDENT_AMBULATORY_CARE_PROVIDER_SITE_OTHER): Payer: Self-pay | Admitting: *Deleted

## 2007-07-23 ENCOUNTER — Encounter: Payer: Self-pay | Admitting: Internal Medicine

## 2007-07-23 DIAGNOSIS — I881 Chronic lymphadenitis, except mesenteric: Secondary | ICD-10-CM

## 2007-07-31 ENCOUNTER — Telehealth: Payer: Self-pay | Admitting: Internal Medicine

## 2007-08-02 ENCOUNTER — Telehealth: Payer: Self-pay | Admitting: *Deleted

## 2007-08-06 ENCOUNTER — Ambulatory Visit: Payer: Self-pay | Admitting: Cardiovascular Disease

## 2007-08-27 ENCOUNTER — Ambulatory Visit: Payer: Self-pay | Admitting: Internal Medicine

## 2007-08-29 ENCOUNTER — Ambulatory Visit: Payer: Self-pay | Admitting: Internal Medicine

## 2007-08-29 LAB — CONVERTED CEMR LAB
CO2: 25 meq/L (ref 19–32)
Chloride: 104 meq/L (ref 96–112)
Creatinine, Ser: 0.9 mg/dL (ref 0.4–1.2)
Eosinophils Absolute: 0.1 10*3/uL (ref 0.0–0.7)
Eosinophils Relative: 2.2 % (ref 0.0–5.0)
GFR calc Af Amer: 82 mL/min
GFR calc non Af Amer: 68 mL/min
Glucose, Bld: 134 mg/dL — ABNORMAL HIGH (ref 70–99)
HCT: 40.1 % (ref 36.0–46.0)
Lymphocytes Relative: 50.7 % — ABNORMAL HIGH (ref 12.0–46.0)
Neutro Abs: 2 10*3/uL (ref 1.4–7.7)
Neutrophils Relative %: 33.3 % — ABNORMAL LOW (ref 43.0–77.0)
Potassium: 4 meq/L (ref 3.5–5.1)
RDW: 13.3 % (ref 11.5–14.6)
WBC: 6 10*3/uL (ref 4.5–10.5)

## 2007-09-17 ENCOUNTER — Ambulatory Visit: Payer: Self-pay | Admitting: Cardiology

## 2007-09-19 ENCOUNTER — Ambulatory Visit: Payer: Self-pay | Admitting: Internal Medicine

## 2007-09-19 DIAGNOSIS — M940 Chondrocostal junction syndrome [Tietze]: Secondary | ICD-10-CM

## 2007-09-26 ENCOUNTER — Telehealth: Payer: Self-pay | Admitting: Internal Medicine

## 2007-10-01 ENCOUNTER — Telehealth: Payer: Self-pay | Admitting: Internal Medicine

## 2007-10-01 DIAGNOSIS — M25569 Pain in unspecified knee: Secondary | ICD-10-CM

## 2007-10-02 ENCOUNTER — Encounter: Payer: Self-pay | Admitting: Internal Medicine

## 2007-10-02 ENCOUNTER — Ambulatory Visit: Payer: Self-pay

## 2007-10-08 ENCOUNTER — Ambulatory Visit: Payer: Self-pay | Admitting: Cardiology

## 2007-10-15 ENCOUNTER — Telehealth: Payer: Self-pay | Admitting: Internal Medicine

## 2007-10-30 ENCOUNTER — Ambulatory Visit: Payer: Self-pay | Admitting: Internal Medicine

## 2007-10-31 ENCOUNTER — Ambulatory Visit: Payer: Self-pay | Admitting: Cardiology

## 2007-11-05 ENCOUNTER — Ambulatory Visit: Payer: Self-pay | Admitting: Cardiology

## 2007-11-05 LAB — CONVERTED CEMR LAB
BUN: 21 mg/dL (ref 6–23)
CO2: 27 meq/L (ref 19–32)
Creatinine, Ser: 1.1 mg/dL (ref 0.4–1.2)
GFR calc Af Amer: 65 mL/min
GFR calc non Af Amer: 54 mL/min
Sodium: 141 meq/L (ref 135–145)

## 2007-11-09 ENCOUNTER — Telehealth: Payer: Self-pay | Admitting: Internal Medicine

## 2007-11-19 ENCOUNTER — Ambulatory Visit: Payer: Self-pay | Admitting: Cardiology

## 2007-11-27 ENCOUNTER — Telehealth: Payer: Self-pay | Admitting: Internal Medicine

## 2007-12-03 ENCOUNTER — Encounter: Admission: RE | Admit: 2007-12-03 | Discharge: 2007-12-03 | Payer: Self-pay | Admitting: Internal Medicine

## 2007-12-03 ENCOUNTER — Ambulatory Visit: Payer: Self-pay | Admitting: Cardiovascular Disease

## 2007-12-13 ENCOUNTER — Telehealth: Payer: Self-pay | Admitting: Internal Medicine

## 2007-12-17 ENCOUNTER — Encounter: Admission: RE | Admit: 2007-12-17 | Discharge: 2007-12-17 | Payer: Self-pay | Admitting: Internal Medicine

## 2007-12-17 ENCOUNTER — Ambulatory Visit: Payer: Self-pay | Admitting: Cardiology

## 2007-12-17 LAB — HM MAMMOGRAPHY

## 2007-12-18 ENCOUNTER — Telehealth: Payer: Self-pay | Admitting: Internal Medicine

## 2007-12-19 ENCOUNTER — Encounter: Payer: Self-pay | Admitting: Cardiology

## 2007-12-19 ENCOUNTER — Ambulatory Visit: Payer: Self-pay

## 2007-12-19 LAB — CONVERTED CEMR LAB
ALT: 29 units/L (ref 0–35)
Bilirubin, Direct: 0.1 mg/dL (ref 0.0–0.3)
CO2: 27 meq/L (ref 19–32)
Calcium: 9.2 mg/dL (ref 8.4–10.5)
Chloride: 102 meq/L (ref 96–112)
Cholesterol: 190 mg/dL (ref 0–200)
Creatinine, Ser: 0.9 mg/dL (ref 0.4–1.2)
LDL Cholesterol: 133 mg/dL — ABNORMAL HIGH (ref 0–99)
Triglycerides: 128 mg/dL (ref 0–149)
VLDL: 26 mg/dL (ref 0–40)

## 2007-12-20 ENCOUNTER — Telehealth: Payer: Self-pay | Admitting: Internal Medicine

## 2007-12-26 ENCOUNTER — Ambulatory Visit: Payer: Self-pay | Admitting: Internal Medicine

## 2008-01-07 ENCOUNTER — Ambulatory Visit: Payer: Self-pay | Admitting: Cardiovascular Disease

## 2008-01-14 ENCOUNTER — Telehealth: Payer: Self-pay | Admitting: Internal Medicine

## 2008-01-27 ENCOUNTER — Ambulatory Visit: Payer: Self-pay | Admitting: Cardiology

## 2008-01-27 ENCOUNTER — Inpatient Hospital Stay (HOSPITAL_COMMUNITY): Admission: EM | Admit: 2008-01-27 | Discharge: 2008-01-29 | Payer: Self-pay | Admitting: *Deleted

## 2008-01-27 ENCOUNTER — Ambulatory Visit: Payer: Self-pay | Admitting: *Deleted

## 2008-01-28 ENCOUNTER — Encounter: Payer: Self-pay | Admitting: Cardiology

## 2008-01-30 ENCOUNTER — Telehealth: Payer: Self-pay | Admitting: Family Medicine

## 2008-02-04 ENCOUNTER — Ambulatory Visit: Payer: Self-pay | Admitting: Cardiovascular Disease

## 2008-02-04 LAB — CONVERTED CEMR LAB
BUN: 15 mg/dL (ref 6–23)
CO2: 26 meq/L (ref 19–32)
Calcium: 8.8 mg/dL (ref 8.4–10.5)
Creatinine, Ser: 0.8 mg/dL (ref 0.4–1.2)
GFR calc non Af Amer: 78 mL/min
Potassium: 3.9 meq/L (ref 3.5–5.1)

## 2008-02-06 ENCOUNTER — Ambulatory Visit: Payer: Self-pay | Admitting: Internal Medicine

## 2008-02-06 DIAGNOSIS — R519 Headache, unspecified: Secondary | ICD-10-CM | POA: Insufficient documentation

## 2008-02-06 DIAGNOSIS — R51 Headache: Secondary | ICD-10-CM

## 2008-02-11 ENCOUNTER — Ambulatory Visit: Payer: Self-pay | Admitting: Cardiology

## 2008-02-15 ENCOUNTER — Telehealth: Payer: Self-pay | Admitting: Internal Medicine

## 2008-02-15 ENCOUNTER — Ambulatory Visit: Payer: Self-pay | Admitting: Cardiovascular Disease

## 2008-03-03 ENCOUNTER — Ambulatory Visit: Payer: Self-pay | Admitting: Cardiology

## 2008-03-05 ENCOUNTER — Telehealth: Payer: Self-pay | Admitting: Internal Medicine

## 2008-03-06 ENCOUNTER — Telehealth: Payer: Self-pay | Admitting: Internal Medicine

## 2008-03-08 ENCOUNTER — Telehealth: Payer: Self-pay | Admitting: Internal Medicine

## 2008-03-19 ENCOUNTER — Ambulatory Visit: Payer: Self-pay | Admitting: Internal Medicine

## 2008-03-19 ENCOUNTER — Ambulatory Visit: Payer: Self-pay | Admitting: Cardiology

## 2008-03-19 DIAGNOSIS — I447 Left bundle-branch block, unspecified: Secondary | ICD-10-CM

## 2008-03-19 LAB — CONVERTED CEMR LAB
Chloride: 102 meq/L (ref 96–112)
Creatinine, Ser: 0.7 mg/dL (ref 0.4–1.2)
GFR calc non Af Amer: 90 mL/min
Glucose, Bld: 151 mg/dL — ABNORMAL HIGH (ref 70–99)
Microalb Creat Ratio: 3.2 mg/g (ref 0.0–30.0)
Microalb, Ur: 0.2 mg/dL (ref 0.0–1.9)
Sodium: 140 meq/L (ref 135–145)

## 2008-03-20 ENCOUNTER — Telehealth: Payer: Self-pay | Admitting: Internal Medicine

## 2008-03-27 ENCOUNTER — Telehealth: Payer: Self-pay | Admitting: Internal Medicine

## 2008-04-02 ENCOUNTER — Emergency Department (HOSPITAL_COMMUNITY): Admission: EM | Admit: 2008-04-02 | Discharge: 2008-04-02 | Payer: Self-pay | Admitting: Emergency Medicine

## 2008-04-04 ENCOUNTER — Ambulatory Visit: Payer: Self-pay | Admitting: Cardiology

## 2008-04-04 ENCOUNTER — Ambulatory Visit (HOSPITAL_COMMUNITY): Admission: RE | Admit: 2008-04-04 | Discharge: 2008-04-04 | Payer: Self-pay | Admitting: Orthopedic Surgery

## 2008-04-07 ENCOUNTER — Ambulatory Visit: Payer: Self-pay | Admitting: Cardiology

## 2008-04-16 ENCOUNTER — Ambulatory Visit: Payer: Self-pay | Admitting: Cardiology

## 2008-04-22 ENCOUNTER — Telehealth: Payer: Self-pay | Admitting: Internal Medicine

## 2008-04-23 ENCOUNTER — Ambulatory Visit: Payer: Self-pay | Admitting: Internal Medicine

## 2008-04-23 LAB — CONVERTED CEMR LAB: Urobilinogen, UA: 1

## 2008-05-07 ENCOUNTER — Ambulatory Visit: Payer: Self-pay | Admitting: Internal Medicine

## 2008-05-08 ENCOUNTER — Telehealth: Payer: Self-pay | Admitting: Internal Medicine

## 2008-05-09 ENCOUNTER — Telehealth: Payer: Self-pay | Admitting: Internal Medicine

## 2008-05-12 ENCOUNTER — Ambulatory Visit: Payer: Self-pay | Admitting: Internal Medicine

## 2008-05-12 DIAGNOSIS — M545 Low back pain: Secondary | ICD-10-CM

## 2008-05-14 ENCOUNTER — Ambulatory Visit (HOSPITAL_COMMUNITY): Admission: RE | Admit: 2008-05-14 | Discharge: 2008-05-14 | Payer: Self-pay | Admitting: Internal Medicine

## 2008-05-20 ENCOUNTER — Telehealth: Payer: Self-pay | Admitting: Internal Medicine

## 2008-05-31 ENCOUNTER — Encounter: Payer: Self-pay | Admitting: Internal Medicine

## 2008-06-03 ENCOUNTER — Emergency Department (HOSPITAL_COMMUNITY): Admission: EM | Admit: 2008-06-03 | Discharge: 2008-06-03 | Payer: Self-pay | Admitting: Emergency Medicine

## 2008-06-05 ENCOUNTER — Telehealth: Payer: Self-pay | Admitting: Internal Medicine

## 2008-06-10 ENCOUNTER — Ambulatory Visit: Payer: Self-pay | Admitting: Internal Medicine

## 2008-06-10 LAB — CONVERTED CEMR LAB
Eosinophils Relative: 3.1 % (ref 0.0–5.0)
HCT: 39.6 % (ref 36.0–46.0)
HDL: 28.8 mg/dL — ABNORMAL LOW (ref >39.00)
Lymphocytes Relative: 49.1 % — ABNORMAL HIGH (ref 12.0–46.0)
MCHC: 34.1 g/dL (ref 30.0–36.0)
MCV: 92.5 fL (ref 78.0–100.0)
Monocytes Absolute: 0.6 10*3/uL (ref 0.1–1.0)
Monocytes Relative: 11.2 % (ref 3.0–12.0)
Neutro Abs: 2 10*3/uL (ref 1.4–7.7)
Neutrophils Relative %: 36.2 % — ABNORMAL LOW (ref 43.0–77.0)
Platelets: 251 10*3/uL (ref 150.0–400.0)
RBC: 4.28 M/uL (ref 3.87–5.11)
RDW: 13.3 % (ref 11.5–14.6)

## 2008-06-11 ENCOUNTER — Ambulatory Visit: Payer: Self-pay | Admitting: Cardiology

## 2008-06-11 ENCOUNTER — Encounter: Payer: Self-pay | Admitting: Cardiology

## 2008-06-19 ENCOUNTER — Telehealth: Payer: Self-pay | Admitting: Internal Medicine

## 2008-07-02 ENCOUNTER — Telehealth: Payer: Self-pay | Admitting: Internal Medicine

## 2008-07-08 ENCOUNTER — Ambulatory Visit: Payer: Self-pay | Admitting: Internal Medicine

## 2008-08-04 ENCOUNTER — Emergency Department (HOSPITAL_COMMUNITY): Admission: EM | Admit: 2008-08-04 | Discharge: 2008-08-05 | Payer: Self-pay | Admitting: Emergency Medicine

## 2008-08-05 ENCOUNTER — Telehealth (INDEPENDENT_AMBULATORY_CARE_PROVIDER_SITE_OTHER): Payer: Self-pay | Admitting: *Deleted

## 2008-08-05 ENCOUNTER — Telehealth: Payer: Self-pay | Admitting: Cardiology

## 2008-08-05 ENCOUNTER — Telehealth: Payer: Self-pay | Admitting: Internal Medicine

## 2008-08-05 ENCOUNTER — Telehealth (INDEPENDENT_AMBULATORY_CARE_PROVIDER_SITE_OTHER): Payer: Self-pay | Admitting: Cardiology

## 2008-08-05 ENCOUNTER — Encounter: Payer: Self-pay | Admitting: *Deleted

## 2008-08-07 ENCOUNTER — Encounter: Payer: Self-pay | Admitting: Internal Medicine

## 2008-08-20 ENCOUNTER — Ambulatory Visit: Payer: Self-pay | Admitting: Internal Medicine

## 2008-08-27 ENCOUNTER — Telehealth (INDEPENDENT_AMBULATORY_CARE_PROVIDER_SITE_OTHER): Payer: Self-pay | Admitting: *Deleted

## 2008-09-10 ENCOUNTER — Encounter: Payer: Self-pay | Admitting: *Deleted

## 2008-09-25 ENCOUNTER — Ambulatory Visit: Payer: Self-pay | Admitting: Internal Medicine

## 2008-09-25 LAB — CONVERTED CEMR LAB
CO2: 27 meq/L (ref 19–32)
Chloride: 104 meq/L (ref 96–112)
Glucose, Bld: 104 mg/dL — ABNORMAL HIGH (ref 70–99)
HDL: 29.7 mg/dL — ABNORMAL LOW (ref >39.00)

## 2008-10-15 ENCOUNTER — Encounter (INDEPENDENT_AMBULATORY_CARE_PROVIDER_SITE_OTHER): Payer: Self-pay | Admitting: *Deleted

## 2008-10-15 ENCOUNTER — Encounter: Payer: Self-pay | Admitting: Cardiology

## 2008-10-23 ENCOUNTER — Ambulatory Visit: Payer: Self-pay | Admitting: Cardiology

## 2008-10-23 LAB — CONVERTED CEMR LAB: POC INR: 1.8

## 2008-11-17 ENCOUNTER — Encounter (INDEPENDENT_AMBULATORY_CARE_PROVIDER_SITE_OTHER): Payer: Self-pay | Admitting: *Deleted

## 2008-11-24 ENCOUNTER — Ambulatory Visit: Payer: Self-pay | Admitting: Cardiology

## 2008-11-28 ENCOUNTER — Ambulatory Visit: Payer: Self-pay | Admitting: Internal Medicine

## 2008-11-28 LAB — CONVERTED CEMR LAB
Calcium: 9.4 mg/dL (ref 8.4–10.5)
Creatinine, Ser: 0.8 mg/dL (ref 0.4–1.2)
Direct LDL: 116 mg/dL
Hgb A1c MFr Bld: 8.1 % — ABNORMAL HIGH (ref 4.6–6.5)
Potassium: 3.5 meq/L (ref 3.5–5.1)
Sodium: 140 meq/L (ref 135–145)

## 2008-12-29 ENCOUNTER — Telehealth: Payer: Self-pay | Admitting: Internal Medicine

## 2009-01-05 ENCOUNTER — Encounter (INDEPENDENT_AMBULATORY_CARE_PROVIDER_SITE_OTHER): Payer: Self-pay | Admitting: *Deleted

## 2009-01-07 ENCOUNTER — Ambulatory Visit: Payer: Self-pay | Admitting: Cardiology

## 2009-01-14 ENCOUNTER — Ambulatory Visit: Payer: Self-pay | Admitting: Family Medicine

## 2009-01-14 DIAGNOSIS — J209 Acute bronchitis, unspecified: Secondary | ICD-10-CM

## 2009-01-15 ENCOUNTER — Ambulatory Visit: Payer: Self-pay | Admitting: Internal Medicine

## 2009-01-15 LAB — CONVERTED CEMR LAB: POC INR: 1.5

## 2009-01-20 ENCOUNTER — Telehealth: Payer: Self-pay | Admitting: Cardiology

## 2009-01-21 LAB — CONVERTED CEMR LAB
BUN: 14 mg/dL (ref 6–23)
CO2: 27 meq/L (ref 19–32)
Calcium: 8.9 mg/dL (ref 8.4–10.5)
Chloride: 102 meq/L (ref 96–112)
GFR calc non Af Amer: 81.58 mL/min (ref >60)
Glucose, Bld: 164 mg/dL — ABNORMAL HIGH (ref 70–99)

## 2009-01-26 ENCOUNTER — Ambulatory Visit: Payer: Self-pay | Admitting: Cardiology

## 2009-02-02 ENCOUNTER — Ambulatory Visit: Payer: Self-pay | Admitting: Internal Medicine

## 2009-02-02 DIAGNOSIS — E876 Hypokalemia: Secondary | ICD-10-CM

## 2009-02-02 LAB — CONVERTED CEMR LAB
Chloride: 102 meq/L (ref 96–112)
Creatinine, Ser: 0.8 mg/dL (ref 0.4–1.2)
Hgb A1c MFr Bld: 7.6 % — ABNORMAL HIGH (ref 4.6–6.5)
Potassium: 3.6 meq/L (ref 3.5–5.1)

## 2009-02-09 ENCOUNTER — Telehealth: Payer: Self-pay | Admitting: Internal Medicine

## 2009-02-18 ENCOUNTER — Ambulatory Visit: Payer: Self-pay | Admitting: Cardiology

## 2009-02-18 LAB — CONVERTED CEMR LAB: POC INR: 2.6

## 2009-03-05 ENCOUNTER — Telehealth: Payer: Self-pay | Admitting: Cardiology

## 2009-04-10 ENCOUNTER — Ambulatory Visit: Payer: Self-pay | Admitting: Internal Medicine

## 2009-04-10 LAB — CONVERTED CEMR LAB: POC INR: 3.2

## 2009-04-22 ENCOUNTER — Telehealth: Payer: Self-pay | Admitting: *Deleted

## 2009-04-24 ENCOUNTER — Ambulatory Visit: Payer: Self-pay | Admitting: Internal Medicine

## 2009-05-12 ENCOUNTER — Telehealth: Payer: Self-pay | Admitting: Cardiology

## 2009-05-13 ENCOUNTER — Ambulatory Visit: Payer: Self-pay | Admitting: Cardiovascular Disease

## 2009-05-13 LAB — CONVERTED CEMR LAB: POC INR: 3.4

## 2009-05-25 ENCOUNTER — Ambulatory Visit: Payer: Self-pay | Admitting: Internal Medicine

## 2009-06-05 ENCOUNTER — Telehealth: Payer: Self-pay | Admitting: Internal Medicine

## 2009-07-08 ENCOUNTER — Ambulatory Visit: Payer: Self-pay | Admitting: Internal Medicine

## 2009-07-16 ENCOUNTER — Ambulatory Visit: Payer: Self-pay | Admitting: Cardiology

## 2009-07-16 ENCOUNTER — Ambulatory Visit: Payer: Self-pay | Admitting: Internal Medicine

## 2009-08-11 ENCOUNTER — Telehealth: Payer: Self-pay | Admitting: Internal Medicine

## 2009-08-11 DIAGNOSIS — R079 Chest pain, unspecified: Secondary | ICD-10-CM

## 2009-08-14 ENCOUNTER — Ambulatory Visit: Payer: Self-pay | Admitting: Cardiovascular Disease

## 2009-08-19 ENCOUNTER — Telehealth: Payer: Self-pay | Admitting: Cardiology

## 2009-08-21 ENCOUNTER — Ambulatory Visit: Payer: Self-pay | Admitting: Cardiology

## 2009-09-10 LAB — CONVERTED CEMR LAB
Alkaline Phosphatase: 58 units/L (ref 39–117)
Bilirubin, Direct: 0.1 mg/dL (ref 0.0–0.3)
Cholesterol: 121 mg/dL (ref 0–200)
HDL: 32.8 mg/dL — ABNORMAL LOW (ref >39.00)
Total Protein: 7.1 g/dL (ref 6.0–8.3)

## 2009-09-14 ENCOUNTER — Ambulatory Visit: Payer: Self-pay | Admitting: Internal Medicine

## 2009-09-22 ENCOUNTER — Telehealth: Payer: Self-pay | Admitting: Internal Medicine

## 2009-09-23 ENCOUNTER — Ambulatory Visit: Payer: Self-pay | Admitting: Internal Medicine

## 2009-09-29 ENCOUNTER — Telehealth: Payer: Self-pay | Admitting: Internal Medicine

## 2009-09-29 DIAGNOSIS — M25519 Pain in unspecified shoulder: Secondary | ICD-10-CM

## 2009-09-30 ENCOUNTER — Ambulatory Visit (HOSPITAL_COMMUNITY): Admission: RE | Admit: 2009-09-30 | Discharge: 2009-09-30 | Payer: Self-pay | Admitting: Internal Medicine

## 2009-10-02 ENCOUNTER — Ambulatory Visit: Payer: Self-pay | Admitting: Cardiology

## 2009-10-02 LAB — CONVERTED CEMR LAB: POC INR: 2.1

## 2009-10-08 ENCOUNTER — Telehealth: Payer: Self-pay | Admitting: Internal Medicine

## 2009-10-30 ENCOUNTER — Ambulatory Visit: Payer: Self-pay | Admitting: Internal Medicine

## 2009-10-30 DIAGNOSIS — M5412 Radiculopathy, cervical region: Secondary | ICD-10-CM

## 2009-11-03 LAB — CONVERTED CEMR LAB
BUN: 14 mg/dL (ref 6–23)
CO2: 19 meq/L (ref 19–32)
Calcium: 9.2 mg/dL (ref 8.4–10.5)
Chloride: 101 meq/L (ref 96–112)
Creatinine, Ser: 0.8 mg/dL (ref 0.40–1.20)
Glucose, Bld: 322 mg/dL — ABNORMAL HIGH (ref 70–99)
Sodium: 135 meq/L (ref 135–145)

## 2009-11-12 ENCOUNTER — Ambulatory Visit: Payer: Self-pay | Admitting: Endocrinology

## 2009-11-13 ENCOUNTER — Telehealth: Payer: Self-pay | Admitting: Internal Medicine

## 2009-11-13 ENCOUNTER — Telehealth: Payer: Self-pay | Admitting: Endocrinology

## 2009-11-17 ENCOUNTER — Telehealth: Payer: Self-pay | Admitting: Internal Medicine

## 2009-12-04 ENCOUNTER — Ambulatory Visit: Payer: Self-pay | Admitting: Endocrinology

## 2009-12-04 ENCOUNTER — Ambulatory Visit: Payer: Self-pay | Admitting: Internal Medicine

## 2009-12-11 ENCOUNTER — Telehealth: Payer: Self-pay | Admitting: Internal Medicine

## 2010-01-08 ENCOUNTER — Telehealth: Payer: Self-pay | Admitting: Internal Medicine

## 2010-01-21 ENCOUNTER — Telehealth: Payer: Self-pay | Admitting: Internal Medicine

## 2010-02-05 ENCOUNTER — Ambulatory Visit: Payer: Self-pay | Admitting: Cardiology

## 2010-02-05 ENCOUNTER — Encounter: Payer: Self-pay | Admitting: Cardiology

## 2010-02-12 ENCOUNTER — Ambulatory Visit: Payer: Self-pay | Admitting: Internal Medicine

## 2010-02-12 DIAGNOSIS — E1059 Type 1 diabetes mellitus with other circulatory complications: Secondary | ICD-10-CM

## 2010-02-19 ENCOUNTER — Ambulatory Visit: Payer: Self-pay

## 2010-02-23 ENCOUNTER — Telehealth: Payer: Self-pay | Admitting: Internal Medicine

## 2010-03-09 ENCOUNTER — Telehealth: Payer: Self-pay | Admitting: Endocrinology

## 2010-03-10 ENCOUNTER — Other Ambulatory Visit: Payer: Self-pay | Admitting: Endocrinology

## 2010-03-10 ENCOUNTER — Ambulatory Visit
Admission: RE | Admit: 2010-03-10 | Discharge: 2010-03-10 | Payer: Self-pay | Source: Home / Self Care | Attending: Endocrinology | Admitting: Endocrinology

## 2010-03-10 LAB — HEMOGLOBIN A1C: Hgb A1c MFr Bld: 10.3 % — ABNORMAL HIGH (ref 4.6–6.5)

## 2010-03-15 ENCOUNTER — Encounter: Payer: Self-pay | Admitting: Cardiology

## 2010-03-15 ENCOUNTER — Ambulatory Visit: Admission: RE | Admit: 2010-03-15 | Discharge: 2010-03-15 | Payer: Self-pay | Source: Home / Self Care

## 2010-03-28 ENCOUNTER — Encounter: Payer: Self-pay | Admitting: Neurosurgery

## 2010-03-28 ENCOUNTER — Encounter: Payer: Self-pay | Admitting: Orthopaedic Surgery

## 2010-03-28 ENCOUNTER — Encounter: Payer: Self-pay | Admitting: Internal Medicine

## 2010-04-06 NOTE — Progress Notes (Signed)
Summary: samples  Phone Note Call from Patient Call back at Home Phone [phone]   Caller: Patient--live call Summary of Call: pt is in the doughnut hole and would like samples of Novolog insulin pen. Not the mix, please. her appt is on 02-12-2010. Initial call taken by: Warnell Forester,  January 21, 2010 10:04 AM  Follow-up for Phone Call        she may h ave- on y our desk Follow-up by: Willy Eddy, LPN,  January 21, 2010 12:16 PM  Additional Follow-up for Phone Call Additional follow up Details #1::        Notified pt. Additional Follow-up by: Lynann Beaver CMA AAMA,  January 21, 2010 12:51 PM

## 2010-04-06 NOTE — Progress Notes (Signed)
Summary: hydroco-acet j pt  Phone Note Call from Patient   Summary of Call: Also needs refill of hydroco-acet for her shoulder, arm, neck pain,knee, back.  Says pharmacy should have faxed request.  Says I sent a message to her that isn't her concerning the need for refill.  [facility].  [name], RN  November 13, 2009 2:21 PM   Initial call taken by: [name], RN,  November 13, 2009 1:48 PM    Prescriptions: HYDROCODONE-ACETAMINOPHEN 10-325 MG TABS (HYDROCODONE-ACETAMINOPHEN) 1/2 to 1 by mouth q 6 hours as needed  #30 x 0   Entered by:   Lynann Beaver CMA   Authorized by:   Stacie Glaze MD   Signed by:   Lynann Beaver CMA on 11/13/2009   Method used:   Printed then faxed to ...       [facility] [address] [phone]* (retail)       [address]       [place], [place]  [address]       Ph: [phone] or [phone]       Fax: [phone]   RxID:   [id number]

## 2010-04-06 NOTE — Progress Notes (Signed)
Summary: conjuctiviits?  Phone Note Call from Patient   Caller: Patient Call For: Stacie Glaze MD Summary of Call: Right eye was red last night with blurred vision and crustiness this am.  Still red this am.  [facility] to  [facility] Initial call taken by: Lynann Beaver CMA,  December 11, 2009 10:54 AM  Follow-up for Phone Call        per [name]- cortocosporin opthalmic drops- 4 drops in both eyes four times a day  Follow-up by: Willy Eddy, LPN,  December 11, 2009 11:08 AM    New/Updated Medications: CORTISPORIN 3.5-10000-1 SOLN (NEOMYCIN-POLYMYXIN-HC) Opthalmic drops.  4 drops in both eyes qid Prescriptions: CORTISPORIN 3.5-10000-1 SOLN (NEOMYCIN-POLYMYXIN-HC) Opthalmic drops.  4 drops in both eyes qid  #1 x 0   Entered by:   [name], RN   Authorized by:   Stacie Glaze MD   Signed by:   [name], RN on 12/11/2009   Method used:   Electronically to        [facility] [address] [phone]* (retail)       [address]       [place], [place]  [address]       Ph: [phone] or [phone]       Fax: [phone]   RxID:   [id number]

## 2010-04-06 NOTE — Medication Information (Signed)
Summary: rov/tm  Anticoagulant Therapy  Managed by: Bethena Midget, RN, BSN Referring MD: Shawnie Pons MD PCP: Stacie Glaze MD Supervising MD: Johney Frame MD, Fayrene Fearing Indication 1: CVA-stroke (ICD-436) Lab Used: [facility] Site: [facility] INR POC 3.2 INR RANGE 2 - 3  Dietary changes: yes       Details: Diet has not been consistent  Health status changes: no    Bleeding/hemorrhagic complications: no    Recent/future hospitalizations: no    Any changes in medication regimen? no    Recent/future dental: no  Any missed doses?: no       Is patient compliant with meds? yes       Allergies: 1)  ! Sulfa 2)  ! Morphine 3)  ! Talwin 4)  ! Codeine 5)  ! Vibramycin  Anticoagulation Management History:      The patient is taking warfarin and comes in today for a routine follow up visit.  Anticoagulation is being administered due to chronic atrial fibrillation.  Positive risk factors for bleeding include history of CVA/TIA and presence of serious comorbidities.  Negative risk factors for bleeding include an age less than 68 years old.  The bleeding index is 'intermediate risk'.  Positive CHADS2 values include History of HTN, History of Diabetes, and Prior Stroke/CVA/TIA.  Negative CHADS2 values include Age > 51 years old.  The start date was 07/06/2002.  Plans are to continue warfarin for life.  Her last INR was 4.2.  Anticoagulation responsible provider: Josaphine Shimamoto MD, Fayrene Fearing.  INR POC: 3.2.  Cuvette Lot#: [id number].  Exp: 04//2012.    Anticoagulation Management Assessment/Plan:      The patient's current anticoagulation dose is Warfarin sodium 6 mg tabs: Take as directed by coumadin clinic..  The target INR is 2 - 3.  The next INR is due 04/28/2009.  Anticoagulation instructions were given to patient.  Results were reviewed/authorized by Bethena Midget, RN, BSN.  She was notified by Bethena Midget, RN, BSN.         Prior Anticoagulation Instructions: INR 2.6  Continue on same dosage 1/2 tablet  daily except 1 tablet on Mondays and Thursdays.  Recheck in 4 weeks.    Current Anticoagulation Instructions: INR 3.2 Skip today's dose then resume 3mg s daily except 6mg s on Mondays and Thursdays. Recheck in 2-3 weeks.

## 2010-04-06 NOTE — Progress Notes (Signed)
Summary: URI  Phone Note Call from Patient Call back at Home Phone [phone]   Reason for Call: Refill Medication Summary of Call: [facility] Congestion, sore throat, laryngitis, and cough.  Would like RX Initial call taken by: Lynann Beaver CMA AAMA,  January 08, 2010 12:16 PM  Follow-up for Phone Call        per dr Yvonne Kendall have clarithromycin 500 two times a day for 7 days Follow-up by: Willy Eddy, LPN,  January 08, 2010 12:19 PM    New/Updated Medications: CLARITHROMYCIN 500 MG XR24H-TAB (CLARITHROMYCIN) 2 by mouth daily Prescriptions: CLARITHROMYCIN 500 MG XR24H-TAB (CLARITHROMYCIN) 2 by mouth daily  #14 x 0   Entered by:   Lynann Beaver CMA AAMA   Authorized by:   Stacie Glaze MD   Signed by:   Lynann Beaver CMA AAMA on 01/08/2010   Method used:   Electronically to        [facility] [address] [phone]* (retail)       [address]       [place], [place]  [address]       Ph: [phone] or [phone]       Fax: [phone]   RxID:   [id number] CLARITHROMYCIN 500 MG XR24H-TAB (CLARITHROMYCIN) 2 by mouth daily  #14 x 0   Entered by:   Lynann Beaver CMA AAMA   Authorized by:   Stacie Glaze MD   Signed by:   Lynann Beaver CMA AAMA on 01/08/2010   Method used:   Electronically to        [facility] [address] [phone]* (retail)       [address]       [place], [place]  [address]       Ph: [phone] or [phone]       Fax: [phone]   RxID:   [id number]  Notified pt.

## 2010-04-06 NOTE — Assessment & Plan Note (Signed)
Summary: NEW ENDO MEDICARE UN CONTROL DIABETES PT--#-PKG-TERRY/DR JENK...   Vital Signs:  Patient profile:   65 year old female Height:      61 inches (154.94 cm) Weight:      216.50 pounds (98.41 kg) BMI:     41.06 O2 Sat:      97 % on Room air Temp:     98.4 degrees F (36.89 degrees C) oral Pulse rate:   79 / minute BP sitting:   114 / 72  (left arm) Cuff size:   large  Vitals Entered By: Brenton Grills MA (November 12, 2009 2:42 PM)  O2 Flow:  Room air CC: New Endo/Uncontrolled DM/Dr. Haskell Flirt is no longer taking Prevacid/aj Is Patient Diabetic? Yes   Primary Provider:  Stacie Glaze MD  CC:  New Endo/Uncontrolled DM/Dr. Haskell Flirt is no longer taking Prevacid/aj.  History of Present Illness: pt states 7 years h/o dm.  it is complicated by cad.  she has been on insulin x 2 years.  she takes Writer.   no cbg record, but states cbg's vary from 100-400.  it is in general higher as the day goes on. pt says her diet and exercise are "poor."   symptomatically, pt states few mos of slight pain at her left arm, and assoc numbness.   Current Medications (verified): 1)  Toprol Xl 100 Mg Xr24h-Tab (Metoprolol Succinate) .Marland Kitchen.. 1 Once Daily 2)  Alprazolam 1 Mg Tabs (Alprazolam) .... One By Mouth Three Times A Day As Needed For Grief 3)  Hydrochlorothiazide 12.5 Mg  Tabs (Hydrochlorothiazide) .... Take 1 Tablet By Mouth Once A Day 4)  Spironolactone 25 Mg  Tabs (Spironolactone) .Marland Kitchen.. 1 Once Daily 5)  Hydrocodone-Acetaminophen 10-325 Mg Tabs (Hydrocodone-Acetaminophen) .... 1/2 To 1 By Mouth Q 6 Hours As Needed 6)  Micardis 40 Mg Tabs (Telmisartan) .Marland Kitchen.. 1 Once Daily or As Directed 7)  Prevacid 30 Mg  Cpdr (Lansoprazole) .... Take 1 Tablet By Mouth Two Times A Day 8)  Amaryl 4 Mg  Tabs (Glimepiride) .... One P.o. B.i.d. 9)  Byetta 10 Mcg Pen 10 Mcg/0.4ml Soln (Exenatide) .... One Injection Twice Daily 10)  Bd Insulin Syringe Half-Unit 31g X 5/16" 0.3 Ml Misc (Insulin  Syringe-Needle U-100) .... 7 Times A Day 11)  Nitrostat 0.4 Mg  Subl (Nitroglycerin) .... Use As Directed 12)  Levemir Flexpen 100 Unit/ml Soln (Insulin Detemir) .... Take 30 U  of Levemir Two Times A Day Unless The Fasting Sugarsr Drop Below 100  Then Cut To 15 Bid 13)  Onetouch Ultra Test  Strp (Glucose Blood) .... Use 6 Times A Day 14)  Novolog Penfill 100 Unit/ml Soln (Insulin Aspart) .Marland Kitchen.. 10 Units  Before Meals or As Directed 15)  Klor-Con M20 20 Meq Cr-Tabs (Potassium Chloride Crys Cr) .... Take One and One-Half Tablet Daily 16)  Lipitor 20 Mg Tabs (Atorvastatin Calcium) .Marland Kitchen.. 1 Once Daily 17)  Warfarin Sodium 6 Mg Tabs (Warfarin Sodium) .... Take As Directed By Coumadin Clinic. 18)  Digoxin 0.25 Mg Tabs (Digoxin) .... Take 1 By Mouth Once A Day 19)  Gabapentin 300 Mg Caps (Gabapentin) .Marland Kitchen.. 1 Three Times A Day 20)  Betamethasone Dipropionate Aug 0.05 % Crea (Betamethasone Dipropionate Aug) .... Apply To Rash Two Times A Day 21)  Soma 250 Mg Tabs (Carisoprodol) .... One By Mouth Two Times A Day 22)  Cyclobenzaprine Hcl 10 Mg Tabs (Cyclobenzaprine Hcl) .Marland Kitchen.. 1 Tablet By Mouth Two Times A Day  Allergies (verified): 1)  !  Sulfa 2)  ! Morphine 3)  ! Talwin 4)  ! Codeine 5)  ! Vibramycin  Family History: Reviewed history from 03/19/2008 and no changes required. Family History of Stroke F 1st degree relative <60 Fam hx OA Family History Diabetes 1st degree relative Family History Hypertension Father:mi age 98 died Family History of Renal Disease:   Social History: Reviewed history from 07/15/2009 and no changes required. Married Retired Never Smoked  Review of Systems       The patient complains of weight gain.         denies blurry vision, headache, chest pain, sob, n/v, urinary frequency, cramps, rash, memory loss, hypoglycemia, and rhinorrhea.  she has ongoing depression since the death of her grandson 8 mos ago.  she reports excessive diaphoresis.  she attributes easy  bruising to coumadin.   Physical Exam  General:  morbidly obese.  no distress  Head:  head: no deformity eyes: no periorbital swelling, no proptosis external nose and ears are normal mouth: no lesion seen Neck:  Supple without thyroid enlargement or tenderness.  Chest Wall:  old sternotomy scar Lungs:  Clear to auscultation bilaterally. Normal respiratory effort.  Heart:  Regular rate and rhythm without murmurs or gallops noted. Normal S1,S2.   Abdomen:  abdomen is soft, nontender.  no hepatosplenomegaly.   not distended.  no hernia  Msk:  muscle bulk and strength are grossly normal.  no obvious joint swelling.  gait is normal and steady  Pulses:  dorsalis pedis intact bilat.  no carotid bruit  Extremities:  no deformity.  no ulcer on the feet.  feet are of normal color and temp.  no edema  Neurologic:  cn 2-12 grossly intact.   readily moves all 4's.   sensation is intact to touch on the feet  Skin:  normal texture and temp.  no rash.  not diaphoretic  Cervical Nodes:  No significant adenopathy.  Psych:  Alert and cooperative; normal mood and affect; normal attention span and concentration.     Impression & Recommendations:  Problem # 1:  DIAB W/O COMP TYPE I [JUV] NOT STATED UNCNTRL (ICD-250.01) needs increased rx  Problem # 2:  GRIEF REACTION, ACUTE (ICD-309.0) this complicates the rx of #1  Problem # 3:  CERVICAL RADICULOPATHY, LEFT (ICD-723.4) Assessment: Unchanged  Medications Added to Medication List This Visit: 1)  Levemir Flexpen 100 Unit/ml Soln (Insulin detemir) .... Take 30 units at bedtime 2)  Novolog Penfill 100 Unit/ml Soln (Insulin aspart) .... 20 units three times a day (just before each meal) 3)  Cyclobenzaprine Hcl 10 Mg Tabs (Cyclobenzaprine hcl) .Marland Kitchen.. 1 tablet by mouth two times a day  Other Orders: Consultation Level IV (45409)  Patient Instructions: 1)  good diet and exercise habits significanly improve the control of your diabetes.  please  let me know if you wish to be referred to a dietician.  high blood sugar is very risky to your health.  you should see an eye doctor every year. 2)  controlling your blood pressure and cholesterol drastically reduces the damage diabetes does to your body.  this also applies to quitting smoking.  please discuss these with your doctor.  you should take an aspirin every day, unless you have been advised by a doctor not to. 3)  we will need to take this complex situation in stages 4)  check your blood sugar 2 times a day.  vary the time of day when you check, between before the 3 meals, and  at bedtime.  also check if you have symptoms of your blood sugar being too high or too low.  please keep a record of the readings and bring it to your next appointment here.  please call us sooner if you are having low blood sugar episodes. 5)  for now, reduce levemir to 30 units at bedtime, and increase novolog to 20 units three times a day (just before each meal) 6)  stop glimepiride. 7)  Please schedule a follow-up appointment in 2 weeks.

## 2010-04-06 NOTE — Assessment & Plan Note (Signed)
Summary: 2 WK FU--STC  RS'D PER PT/NWS   Vital Signs:  Patient profile:   65 year old female Height:      61 inches (154.94 cm) Weight:      219 pounds (99.55 kg) BMI:     41.53 O2 Sat:      96 % on Room air Temp:     98.6 degrees F (37.00 degrees C) oral Pulse rate:   76 / minute Pulse rhythm:   regular BP sitting:   122 / 78  (left arm) Cuff size:   large  Vitals Entered By: Brenton Grills MA (December 04, 2009 2:26 PM)  O2 Flow:  Room air CC: 2 week F/U/pt is no longer taking Soma or using Betamethasone cream/aj Is Patient Diabetic? Yes   Primary Provider:  Stacie Glaze MD  CC:  2 week F/U/pt is no longer taking Soma or using Betamethasone cream/aj.  History of Present Illness: no cbg record, but states cbg's are still high.  it varies from 114-272.  it is lowest in am, then increases as the day goes on.    Current Medications (verified): 1)  Toprol Xl 100 Mg Xr24h-Tab (Metoprolol Succinate) .Marland Kitchen.. 1 Once Daily 2)  Alprazolam 1 Mg Tabs (Alprazolam) .... One By Mouth Three Times A Day As Needed For Grief 3)  Hydrochlorothiazide 12.5 Mg  Tabs (Hydrochlorothiazide) .... Take 1 Tablet By Mouth Once A Day 4)  Spironolactone 25 Mg  Tabs (Spironolactone) .Marland Kitchen.. 1 Once Daily 5)  Hydrocodone-Acetaminophen 10-325 Mg Tabs (Hydrocodone-Acetaminophen) .... 1/2 To 1 By Mouth Q 6 Hours As Needed 6)  Micardis 40 Mg Tabs (Telmisartan) .Marland Kitchen.. 1 Once Daily or As Directed 7)  Prevacid 30 Mg  Cpdr (Lansoprazole) .... Take 1 Tablet By Mouth Two Times A Day 8)  Byetta 10 Mcg Pen 10 Mcg/0.35ml Soln (Exenatide) .... One Injection Twice Daily 9)  Bd Insulin Syringe Half-Unit 31g X 5/16" 0.3 Ml Misc (Insulin Syringe-Needle U-100) .... 7 Times A Day 10)  Nitrostat 0.4 Mg  Subl (Nitroglycerin) .... Use As Directed 11)  Levemir Flexpen 100 Unit/ml Soln (Insulin Detemir) .... Take 30 Units At Bedtime 12)  Onetouch Ultra Test  Strp (Glucose Blood) .... Use 6 Times A Day 13)  Novolog Penfill 100 Unit/ml  Soln (Insulin Aspart) .... 20 Units Three Times A Day (Just Before Each Meal) 14)  Klor-Con M20 20 Meq Cr-Tabs (Potassium Chloride Crys Cr) .... Take One and One-Half Tablet Daily 15)  Lipitor 20 Mg Tabs (Atorvastatin Calcium) .Marland Kitchen.. 1 Once Daily 16)  Warfarin Sodium 6 Mg Tabs (Warfarin Sodium) .... Take As Directed By Coumadin Clinic. 17)  Digoxin 0.25 Mg Tabs (Digoxin) .... Take 1 By Mouth Once A Day 18)  Gabapentin 300 Mg Caps (Gabapentin) .Marland Kitchen.. 1 Three Times A Day 19)  Betamethasone Dipropionate Aug 0.05 % Crea (Betamethasone Dipropionate Aug) .... Apply To Rash Two Times A Day 20)  Soma 250 Mg Tabs (Carisoprodol) .... One By Mouth Two Times A Day 21)  Cyclobenzaprine Hcl 10 Mg Tabs (Cyclobenzaprine Hcl) .Marland Kitchen.. 1 Tablet By Mouth Two Times A Day  Allergies (verified): 1)  ! Sulfa 2)  ! Morphine 3)  ! Talwin 4)  ! Codeine 5)  ! Vibramycin  Past History:  Past Medical History: Last updated: 07/15/2009 Current Problems:  CORONARY ARTERY DISEASE (ICD-414.00) ATRIAL FIBRILLATION (ICD-427.31) ANTICOAGULATION RX (ICD-V58.61) HYPERTENSION (ICD-401.9) HYPERLIPIDEMIA NEC/NOS (ICD-272.4) GERD (ICD-530.81) HYPOKALEMIA (ICD-276.8) DIABETES MELLITUS, TYPE II (ICD-250.00) DEPRESSION (ICD-311) GRIEF REACTION, ACUTE (ICD-309.0) ACUTE BRONCHITIS (ICD-466.0) BACK  PAIN, LUMBAR, WITH RADICULOPATHY (ICD-724.4) LBBB (ICD-426.3) HEADACHE (ICD-784.0) KNEE PAIN, RIGHT (ICD-719.46) COSTOCHONDRITIS, ACUTE (ICD-733.6) CHRONIC LYMPHADENITIS (ICD-289.1) URINARY FREQUENCY (ICD-788.41) OSTEOARTHRITIS (ICD-715.90) BURSITIS, ACROMIOCLAVICULAR, LEFT (ICD-726.10) FAMILY HISTORY DIABETES 1ST DEGREE RELATIVE (ICD-V18.0)  Review of Systems  The patient denies hypoglycemia.    Physical Exam  General:  morbidly obese.  no distress. Skin:  insulin injection sites at anterior abdomen are normal, except for a few ecchymoses.   Impression & Recommendations:  Problem # 1:  DIAB W/O COMP TYPE I [JUV] NOT  STATED UNCNTRL (ICD-250.01) the pattern of her cbg's indicates she needs more novolog, and less levemir.    Medications Added to Medication List This Visit: 1)  Levemir Flexpen 100 Unit/ml Soln (Insulin detemir) .... Take 10 units at bedtime 2)  Novolog Penfill 100 Unit/ml Soln (Insulin aspart) .... 30 units three times a day (just before each meal)  Other Orders: Est. Patient Level III (30865) Pneumococcal Vaccine (78469) Admin 1st Vaccine (62952) Flu Vaccine 5yrs + MEDICARE PATIENTS (W4132) Administration Flu vaccine - MCR (G4010)  Patient Instructions: 1)  we will need to take this complex situation in stages 2)  check your blood sugar 2 times a day.  vary the time of day when you check, between before the 3 meals, and at bedtime.  also check if you have symptoms of your blood sugar being too high or too low.  please keep a record of the readings and bring it to your next appointment here.  please call us sooner if you are having low blood sugar episodes. 3)  for now, reduce levemir to 10 units at bedtime, and increase novolog to 30 units three times a day (just before each meal) 4)  Please schedule a follow-up appointment in 3 weeks.                      Immunizations Administered:  Pneumonia Vaccine:    Vaccine Type: Pneumovax    Site: right deltoid    Mfr: Merck    Dose: 0.5 ml    Route: Thayer    Given by: Brenton Grills MA    Exp. Date: 05/24/2011    Lot #: [id number]    VIS given: 02/09/09 version given December 04, 2009.       Flu Vaccine Consent Questions     Do you have a history of severe allergic reactions to this vaccine? no    Any prior history of allergic reactions to egg and/or gelatin? no    Do you have a sensitivity to the preservative Thimersol? no    Do you have a past history of Guillan-Barre Syndrome? no    Do you currently have an acute febrile illness? no    Have you ever had a severe reaction to latex? no    Vaccine information given  and explained to patient? yes    Are you currently pregnant? no    Lot Number:[id number]   Exp Date:09/04/2010   Site Given  Right Deltoid IMu

## 2010-04-06 NOTE — Medication Information (Signed)
Summary: ccr/jss  Anticoagulant Therapy  Managed by: Cloyde Reams, RN, BSN Referring MD: Shawnie Pons MD PCP: Stacie Glaze MD Supervising MD: Eden Emms MD, Theron Arista Indication 1: CVA-stroke (ICD-436) Lab Used: [facility] Site: [facility] INR POC 3.4 INR RANGE 2 - 3  Dietary changes: yes       Details: Poor dieary intake, food choices.  Health status changes: no    Bleeding/hemorrhagic complications: no    Recent/future hospitalizations: no    Any changes in medication regimen? no    Recent/future dental: no  Any missed doses?: no       Is patient compliant with meds? yes       Allergies: 1)  ! Sulfa 2)  ! Morphine 3)  ! Talwin 4)  ! Codeine 5)  ! Vibramycin  Anticoagulation Management History:      The patient is taking warfarin and comes in today for a routine follow up visit.  The patient is taking warfarin for chronic atrial fibrillation.  Positive risk factors for bleeding include history of CVA/TIA and presence of serious comorbidities.  Negative risk factors for bleeding include an age less than 51 years old.  The bleeding index is 'intermediate risk'.  Positive CHADS2 values include History of HTN, History of Diabetes, and Prior Stroke/CVA/TIA.  Negative CHADS2 values include Age > 37 years old.  The start date was 07/06/2002.  Plans are to continue warfarin for life.  Her last INR was 4.2.  Anticoagulation responsible provider: Eden Emms MD, Theron Arista.  INR POC: 3.4.  Cuvette Lot#: [id number].  Exp: 07/2010.    Anticoagulation Management Assessment/Plan:      The patient's current anticoagulation dose is Warfarin sodium 6 mg tabs: Take as directed by coumadin clinic..  The target INR is 2 - 3.  The next INR is due 06/03/2009.  Anticoagulation instructions were given to patient.  Results were reviewed/authorized by Cloyde Reams, RN, BSN.  She was notified by Cloyde Reams RN.         Prior Anticoagulation Instructions: INR 3.2 Skip today's dose then resume 3mg s daily  except 6mg s on Mondays and Thursdays. Recheck in 2-3 weeks.   Current Anticoagulation Instructions: INR 3.4  Skip today's dosage of coumadin then start taking 1/2 tablet daily except 1 tablet on Mondays.  Recheck in 2-3 weeks.

## 2010-04-06 NOTE — Medication Information (Signed)
Summary: overdue for follow-up/ewj  Anticoagulant Therapy  Managed by: Cloyde Reams, RN, BSN Referring MD: Shawnie Pons MD PCP: Stacie Glaze MD Supervising MD: Jens Som MD, Arlys John Indication 1: CVA-stroke (ICD-436) Lab Used: [facility] Site: [facility] INR POC 2.1 INR RANGE 2 - 3  Dietary changes: no    Health status changes: no    Bleeding/hemorrhagic complications: no    Recent/future hospitalizations: no    Any changes in medication regimen? yes       Details: Started on Amrix and feels tired, lethargic.   Recent/future dental: no  Any missed doses?: no       Is patient compliant with meds? yes       Allergies: 1)  ! Sulfa 2)  ! Morphine 3)  ! Talwin 4)  ! Codeine 5)  ! Vibramycin  Anticoagulation Management History:      The patient is taking warfarin and comes in today for a routine follow up visit.  She is being anticoagulated due to chronic atrial fibrillation.  Positive risk factors for bleeding include history of CVA/TIA and presence of serious comorbidities.  Negative risk factors for bleeding include an age less than 99 years old.  The bleeding index is 'intermediate risk'.  Positive CHADS2 values include History of HTN, History of Diabetes, and Prior Stroke/CVA/TIA.  Negative CHADS2 values include Age > 69 years old.  The start date was 07/06/2002.  Plans are to continue warfarin for life.  Her last INR was 4.2.  Anticoagulation responsible provider: Jens Som MD, Arlys John.  INR POC: 2.1.  Cuvette Lot#: [id number].  Exp: 12/2010.    Anticoagulation Management Assessment/Plan:      The patient's current anticoagulation dose is Warfarin sodium 6 mg tabs: Take as directed by coumadin clinic..  The target INR is 2 - 3.  The next INR is due 10/30/2009.  Anticoagulation instructions were given to patient.  Results were reviewed/authorized by Cloyde Reams, RN, BSN.  She was notified by Cloyde Reams RN.         Prior Anticoagulation Instructions: INR 1.9. Take 1  tablet today, then take 0.5 tablet daily except 1 tablet on Mon.  Recheck in 4 weeks.  Current Anticoagulation Instructions: INR 2.1  Continue on same dosage 1/2 tablet daily except 1 tablet on Mondays.  Recheck in 4 weeks.

## 2010-04-06 NOTE — Progress Notes (Signed)
Summary: BS high  Phone Note Call from Patient Call back at Work Phone    Caller: Patient Call For: Stacie Glaze MD Summary of Call: BS's going from high 200's- 300's, 363 today. Had a death in the family and very stressed out- crying. Has appt Monday. Needs some help before then. Initial call taken by: Raechel Ache, RN,  April 22, 2009 4:43 PM  Follow-up for Phone Call        ov visit made for tomorrow Follow-up by: Willy Eddy, LPN,  April 23, 2009 11:44 AM

## 2010-04-06 NOTE — Assessment & Plan Note (Signed)
Summary: 1 month fup//ccm/PT RSC/CJR   Vital Signs:  Patient profile:   65 year old female Height:      61 inches Weight:      218 pounds BMI:     41.34 Temp:     98.2 degrees F oral Pulse rate:   72 / minute Resp:     14 per minute BP sitting:   130 / 80  (left arm)  Vitals Entered By: Willy Eddy, LPN (October 30, 2009 4:16 PM) CC: roa- and continues to c/o left arm pain and swelling Is Patient Diabetic? Yes Did you bring your meter with you today? No   Primary Care Provider:  Stacie Glaze MD  CC:  roa- and continues to c/o left arm pain and swelling.  History of Present Illness: present for continues pain in her left shoulder the pain is not  exertional nor similar to any anginal pains she has exeprienced the pan is worse with movement, pulling and lifting she has noted slightly higher CBG's and her weight continues to increase she has good blood pressure control  her risks are CAD hx and arrythmia  Preventive Screening-Counseling & Management  Alcohol-Tobacco     Smoking Status: never     Passive Smoke Exposure: no  Problems Prior to Update: 1)  Cervical Radiculopathy, Left  (ICD-723.4) 2)  Shoulder Pain, Left  (ICD-719.41) 3)  Pulmonary Congestion  (ICD-786.9) 4)  Diab w/o Comp Type I [juv] Not Stated Uncntrl  (ICD-250.01) 5)  Rib Pain, Left Sided  (ICD-786.50) 6)  Coronary Artery Disease  (ICD-414.00) 7)  Atrial Fibrillation  (ICD-427.31) 8)  Anticoagulation Rx  (ICD-V58.61) 9)  Hypertension  (ICD-401.9) 10)  Hyperlipidemia Nec/nos  (ICD-272.4) 11)  Gerd  (ICD-530.81) 12)  Hypokalemia  (ICD-276.8) 13)  Diabetes Mellitus, Type II  (ICD-250.00) 14)  Depression  (ICD-311) 15)  Grief Reaction, Acute  (ICD-309.0) 16)  Acute Bronchitis  (ICD-466.0) 17)  Back Pain, Lumbar, With Radiculopathy  (ICD-724.4) 18)  Lbbb  (ICD-426.3) 19)  Headache  (ICD-784.0) 20)  Knee Pain, Right  (ICD-719.46) 21)  Costochondritis, Acute  (ICD-733.6) 22)  Chronic  Lymphadenitis  (ICD-289.1) 23)  Urinary Frequency  (ICD-788.41) 24)  Osteoarthritis  (ICD-715.90) 25)  Bursitis, Acromioclavicular, Left  (ICD-726.10) 26)  Family History Diabetes 1st Degree Relative  (ICD-V18.0)  Current Problems (verified): 1)  Shoulder Pain, Left  (ICD-719.41) 2)  Pulmonary Congestion  (ICD-786.9) 3)  Diab w/o Comp Type I [juv] Not Stated Uncntrl  (ICD-250.01) 4)  Rib Pain, Left Sided  (ICD-786.50) 5)  Coronary Artery Disease  (ICD-414.00) 6)  Atrial Fibrillation  (ICD-427.31) 7)  Anticoagulation Rx  (ICD-V58.61) 8)  Hypertension  (ICD-401.9) 9)  Hyperlipidemia Nec/nos  (ICD-272.4) 10)  Gerd  (ICD-530.81) 11)  Hypokalemia  (ICD-276.8) 12)  Diabetes Mellitus, Type II  (ICD-250.00) 13)  Depression  (ICD-311) 14)  Grief Reaction, Acute  (ICD-309.0) 15)  Acute Bronchitis  (ICD-466.0) 16)  Back Pain, Lumbar, With Radiculopathy  (ICD-724.4) 17)  Lbbb  (ICD-426.3) 18)  Headache  (ICD-784.0) 19)  Knee Pain, Right  (ICD-719.46) 20)  Costochondritis, Acute  (ICD-733.6) 21)  Chronic Lymphadenitis  (ICD-289.1) 22)  Urinary Frequency  (ICD-788.41) 23)  Osteoarthritis  (ICD-715.90) 24)  Bursitis, Acromioclavicular, Left  (ICD-726.10) 25)  Family History Diabetes 1st Degree Relative  (ICD-V18.0)  Medications Prior to Update: 1)  Toprol Xl 100 Mg Xr24h-Tab (Metoprolol Succinate) .Marland Kitchen.. 1 Once Daily 2)  Alprazolam 1 Mg Tabs (Alprazolam) .... One By Mouth Three Times A  Day As Needed For Grief 3)  Hydrochlorothiazide 12.5 Mg  Tabs (Hydrochlorothiazide) .... Take 1 Tablet By Mouth Once A Day 4)  Spironolactone 25 Mg  Tabs (Spironolactone) .Marland Kitchen.. 1 Once Daily 5)  Hydrocodone-Acetaminophen 10-325 Mg Tabs (Hydrocodone-Acetaminophen) .... 1/2 To 1 By Mouth Q 6 Hours As Needed 6)  Micardis 40 Mg Tabs (Telmisartan) .Marland Kitchen.. 1 Once Daily or As Directed 7)  Prevacid 30 Mg  Cpdr (Lansoprazole) .... Take 1 Tablet By Mouth Two Times A Day 8)  Amaryl 4 Mg  Tabs (Glimepiride) .... One P.o.  B.i.d. 9)  Byetta 10 Mcg Pen 10 Mcg/0.26ml Soln (Exenatide) .... One Injection Twice Daily 10)  Bd Insulin Syringe Half-Unit 31g X 5/16" 0.3 Ml Misc (Insulin Syringe-Needle U-100) .... 7 Times A Day 11)  Nitrostat 0.4 Mg  Subl (Nitroglycerin) .... Use As Directed 12)  Levemir Flexpen 100 Unit/ml Soln (Insulin Detemir) .... Take 30 U  of Levemir Two Times A Day Unless The Fasting Sugarsr Drop Below 100  Then Cut To 15 Bid 13)  Onetouch Ultra Test  Strp (Glucose Blood) .... Use 6 Times A Day 14)  Novolog Penfill 100 Unit/ml Soln (Insulin Aspart) .Marland Kitchen.. 10 Units  Before Meals or As Directed 15)  Klor-Con M20 20 Meq Cr-Tabs (Potassium Chloride Crys Cr) .... Take One and One-Half Tablet Daily 16)  Lipitor 20 Mg Tabs (Atorvastatin Calcium) .Marland Kitchen.. 1 Once Daily 17)  Warfarin Sodium 6 Mg Tabs (Warfarin Sodium) .... Take As Directed By Coumadin Clinic. 18)  Digoxin 0.25 Mg Tabs (Digoxin) .... Take 1 By Mouth Once A Day 19)  Gabapentin 300 Mg Caps (Gabapentin) .Marland Kitchen.. 1 Three Times A Day 20)  Betamethasone Dipropionate Aug 0.05 % Crea (Betamethasone Dipropionate Aug) .... Apply To Rash Two Times A Day 21)  Cyclobenzaprine Hcl 10 Mg Tabs (Cyclobenzaprine Hcl) .Marland Kitchen.. 1 Two Times A Day  Current Medications (verified): 1)  Toprol Xl 100 Mg Xr24h-Tab (Metoprolol Succinate) .Marland Kitchen.. 1 Once Daily 2)  Alprazolam 1 Mg Tabs (Alprazolam) .... One By Mouth Three Times A Day As Needed For Grief 3)  Hydrochlorothiazide 12.5 Mg  Tabs (Hydrochlorothiazide) .... Take 1 Tablet By Mouth Once A Day 4)  Spironolactone 25 Mg  Tabs (Spironolactone) .Marland Kitchen.. 1 Once Daily 5)  Hydrocodone-Acetaminophen 10-325 Mg Tabs (Hydrocodone-Acetaminophen) .... 1/2 To 1 By Mouth Q 6 Hours As Needed 6)  Micardis 40 Mg Tabs (Telmisartan) .Marland Kitchen.. 1 Once Daily or As Directed 7)  Prevacid 30 Mg  Cpdr (Lansoprazole) .... Take 1 Tablet By Mouth Two Times A Day 8)  Amaryl 4 Mg  Tabs (Glimepiride) .... One P.o. B.i.d. 9)  Byetta 10 Mcg Pen 10 Mcg/0.56ml Soln  (Exenatide) .... One Injection Twice Daily 10)  Bd Insulin Syringe Half-Unit 31g X 5/16" 0.3 Ml Misc (Insulin Syringe-Needle U-100) .... 7 Times A Day 11)  Nitrostat 0.4 Mg  Subl (Nitroglycerin) .... Use As Directed 12)  Levemir Flexpen 100 Unit/ml Soln (Insulin Detemir) .... Take 30 U  of Levemir Two Times A Day Unless The Fasting Sugarsr Drop Below 100  Then Cut To 15 Bid 13)  Onetouch Ultra Test  Strp (Glucose Blood) .... Use 6 Times A Day 14)  Novolog Penfill 100 Unit/ml Soln (Insulin Aspart) .Marland Kitchen.. 10 Units  Before Meals or As Directed 15)  Klor-Con M20 20 Meq Cr-Tabs (Potassium Chloride Crys Cr) .... Take One and One-Half Tablet Daily 16)  Lipitor 20 Mg Tabs (Atorvastatin Calcium) .Marland Kitchen.. 1 Once Daily 17)  Warfarin Sodium 6 Mg Tabs (Warfarin Sodium) .... Take  As Directed By Coumadin Clinic. 18)  Digoxin 0.25 Mg Tabs (Digoxin) .... Take 1 By Mouth Once A Day 19)  Gabapentin 300 Mg Caps (Gabapentin) .Marland Kitchen.. 1 Three Times A Day 20)  Betamethasone Dipropionate Aug 0.05 % Crea (Betamethasone Dipropionate Aug) .... Apply To Rash Two Times A Day 21)  Soma 250 Mg Tabs (Carisoprodol) .... One By Mouth Two Times A Day  Allergies (verified): 1)  ! Sulfa 2)  ! Morphine 3)  ! Talwin 4)  ! Codeine 5)  ! Vibramycin  Past History:  Family History: Last updated: 03/19/2008 Family History of Stroke F 1st degree relative <60 Fam hx OA Family History Diabetes 1st degree relative Family History Hypertension Father:mi age 18 died Family History of Renal Disease:   Social History: Last updated: 07/15/2009 Married Retired Never Smoked  Risk Factors: Exercise: no (01/29/2007)  Risk Factors: Smoking Status: never (10/30/2009) Passive Smoke Exposure: no (10/30/2009)  Past medical, surgical, family and social histories (including risk factors) reviewed, and no changes noted (except as noted below).  Past Medical History: Reviewed history from 07/15/2009 and no changes required. Current Problems:   CORONARY ARTERY DISEASE (ICD-414.00) ATRIAL FIBRILLATION (ICD-427.31) ANTICOAGULATION RX (ICD-V58.61) HYPERTENSION (ICD-401.9) HYPERLIPIDEMIA NEC/NOS (ICD-272.4) GERD (ICD-530.81) HYPOKALEMIA (ICD-276.8) DIABETES MELLITUS, TYPE II (ICD-250.00) DEPRESSION (ICD-311) GRIEF REACTION, ACUTE (ICD-309.0) ACUTE BRONCHITIS (ICD-466.0) BACK PAIN, LUMBAR, WITH RADICULOPATHY (ICD-724.4) LBBB (ICD-426.3) HEADACHE (ICD-784.0) KNEE PAIN, RIGHT (ICD-719.46) COSTOCHONDRITIS, ACUTE (ICD-733.6) CHRONIC LYMPHADENITIS (ICD-289.1) URINARY FREQUENCY (ICD-788.41) OSTEOARTHRITIS (ICD-715.90) BURSITIS, ACROMIOCLAVICULAR, LEFT (ICD-726.10) FAMILY HISTORY DIABETES 1ST DEGREE RELATIVE (ICD-V18.0)  Past Surgical History: Reviewed history from 07/15/2009 and no changes required. angioplasty-06/2001 Lumbar laminectomy Coronary artery bypass graft LAD feb 27,2004 Tonsillectomy Cholecystectomy Hysterectomy  Family History: Reviewed history from 03/19/2008 and no changes required. Family History of Stroke F 1st degree relative <60 Fam hx OA Family History Diabetes 1st degree relative Family History Hypertension Father:mi age 18 died Family History of Renal Disease:   Social History: Reviewed history from 07/15/2009 and no changes required. Married Retired Never Smoked  Review of Systems       The patient complains of chest pain, dyspnea on exertion, peripheral edema, and severe indigestion/heartburn.  The patient denies anorexia, fever, weight loss, weight gain, vision loss, decreased hearing, hoarseness, syncope, prolonged cough, headaches, hemoptysis, abdominal pain, melena, hematochezia, hematuria, incontinence, genital sores, muscle weakness, suspicious skin lesions, transient blindness, difficulty walking, depression, unusual weight change, abnormal bleeding, enlarged lymph nodes, angioedema, and breast masses.    Physical Exam  General:  alert and overweight-appearing.   Head:  normocephalic  and atraumatic.   Eyes:  pupils equal and pupils round.   Ears:  R ear normal and L ear normal.   Neck:  supple and decreased ROM.   Lungs:  normal respiratory effort and no wheezes.   Heart:  normal rate and regular rhythm.   Abdomen:  Bowel sounds positive,abdomen soft and non-tender without masses, organomegaly or hernias noted. Msk:  lumbar lordosis, SI joint tenderness, and trigger point tenderness.   neck tight and tender  Diabetes Management Exam:    Foot Exam (with socks and/or shoes not present):       Sensory-Pinprick/Light touch:          Left medial foot (L-4): diminished          Left dorsal foot (L-5): diminished          Left lateral foot (S-1): diminished          Right medial foot (L-4): diminished  Right dorsal foot (L-5): diminished          Right lateral foot (S-1): diminished   Impression & Recommendations:  Problem # 1:  DIAB W/O COMP TYPE I [JUV] NOT STATED UNCNTRL (ICD-250.01)  Her updated medication list for this problem includes:    Micardis 40 Mg Tabs (Telmisartan) .Marland Kitchen... 1 once daily or as directed    Amaryl 4 Mg Tabs (Glimepiride) ..... One p.o. b.i.d.    Byetta 10 Mcg Pen 10 Mcg/0.9ml Soln (Exenatide) ..... One injection twice daily    Levemir Flexpen 100 Unit/ml Soln (Insulin detemir) .Marland Kitchen... Take 30 u  of levemir two times a day unless the fasting sugarsr drop below 100  then cut to 15 bid    Novolog Penfill 100 Unit/ml Soln (Insulin aspart) .Marland KitchenMarland KitchenMarland KitchenMarland Kitchen 10 units  before meals or as directed  Labs Reviewed: Creat: 0.8 (08/21/2009)    Reviewed HgBA1c results: 7.6 (02/02/2009)  8.1 (11/28/2008)  Problem # 2:  SHOULDER PAIN, LEFT (ICD-719.41) Assessment: Unchanged and arm pain leading [place] to think that hthis may be neck Her updated medication list for this problem includes:    Hydrocodone-acetaminophen 10-325 Mg Tabs (Hydrocodone-acetaminophen) .Marland Kitchen... 1/2 to 1 by mouth q 6 hours as needed    Soma 250 Mg Tabs (Carisoprodol) ..... One by mouth two  times a day  Problem # 3:  CERVICAL RADICULOPATHY, LEFT (ICD-723.4) no prior hx of MRI of neck but has documented lumbar disc disc sugery back surgery was  aout 10 years ago pt to call next week  Problem # 4:  DEPRESSION (ICD-311)  Her updated medication list for this problem includes:    Alprazolam 1 Mg Tabs (Alprazolam) ..... One by mouth three times a day as needed for grief  Discussed treatment options, including trial of antidpressant medication. Will refer to behavioral health. Follow-up call in in 24-48 hours and recheck in 2 weeks, sooner as needed. Patient agrees to call if any worsening of symptoms or thoughts of doing harm arise. Verified that the patient has no suicidal ideation at this time.   Complete Medication List: 1)  Toprol Xl 100 Mg Xr24h-tab (Metoprolol succinate) .Marland Kitchen.. 1 once daily 2)  Alprazolam 1 Mg Tabs (Alprazolam) .... One by mouth three times a day as needed for grief 3)  Hydrochlorothiazide 12.5 Mg Tabs (Hydrochlorothiazide) .... Take 1 tablet by mouth once a day 4)  Spironolactone 25 Mg Tabs (Spironolactone) .Marland Kitchen.. 1 once daily 5)  Hydrocodone-acetaminophen 10-325 Mg Tabs (Hydrocodone-acetaminophen) .... 1/2 to 1 by mouth q 6 hours as needed 6)  Micardis 40 Mg Tabs (Telmisartan) .Marland Kitchen.. 1 once daily or as directed 7)  Prevacid 30 Mg Cpdr (Lansoprazole) .... Take 1 tablet by mouth two times a day 8)  Amaryl 4 Mg Tabs (Glimepiride) .... One p.o. b.i.d. 9)  Byetta 10 Mcg Pen 10 Mcg/0.41ml Soln (Exenatide) .... One injection twice daily 10)  Bd Insulin Syringe Half-unit 31g X 5/16" 0.3 Ml Misc (Insulin syringe-needle u-100) .... 7 times a day 11)  Nitrostat 0.4 Mg Subl (Nitroglycerin) .... Use as directed 12)  Levemir Flexpen 100 Unit/ml Soln (Insulin detemir) .... Take 30 u  of levemir two times a day unless the fasting sugarsr drop below 100  then cut to 15 bid 13)  Onetouch Ultra Test Strp (Glucose blood) .... Use 6 times a day 14)  Novolog Penfill 100 Unit/ml Soln  (Insulin aspart) .Marland Kitchen.. 10 units  before meals or as directed 15)  Klor-con M20 20 Meq Cr-tabs (Potassium chloride crys  cr) .... Take one and one-half tablet daily 16)  Lipitor 20 Mg Tabs (Atorvastatin calcium) .Marland Kitchen.. 1 once daily 17)  Warfarin Sodium 6 Mg Tabs (Warfarin sodium) .... Take as directed by coumadin clinic. 18)  Digoxin 0.25 Mg Tabs (Digoxin) .... Take 1 by mouth once a day 19)  Gabapentin 300 Mg Caps (Gabapentin) .Marland Kitchen.. 1 three times a day 20)  Betamethasone Dipropionate Aug 0.05 % Crea (Betamethasone dipropionate aug) .... Apply to rash two times a day 21)  Soma 250 Mg Tabs (Carisoprodol) .... One by mouth two times a day  Other Orders: Protime (16109UE) Fingerstick (36416) TLB-BMP (Basic Metabolic Panel-BMET) (80048-METABOL) TLB-A1C / Hgb A1C (Glycohemoglobin) (83036-A1C)  Patient Instructions: 1)  Please schedule a follow-up appointment in 1 month.   ANTICOAGULATION RECORD PREVIOUS REGIMEN & LAB RESULTS Anticoagulation Diagnosis:  CVA-stroke (ICD-436) on  07/08/2008 Previous INR Goal Range:  2 - 3 on  07/08/2008 Previous INR:  4.2 on  09/25/2008 Previous Coumadin Dose(mg):  6mg  on  05/13/2009 Previous Regimen:  3mg ,3mg ,4mg  altinate on  11/22/2006  NEW REGIMEN & LAB RESULTS Current INR: 2.9 Regimen: same  Repeat testing in: 4 weeks  Anticoagulation Visit Questionnaire Coumadin dose missed/changed:  No Abnormal Bleeding Symptoms:  No  Any diet changes including alcohol intake, vegetables or greens since the last visit:  No Any illnesses or hospitalizations since the last visit:  No Any signs of clotting since the last visit (including chest discomfort, dizziness, shortness of breath, arm tingling, slurred speech, swelling or redness in leg):  No  MEDICATIONS TOPROL XL 100 MG XR24H-TAB (METOPROLOL SUCCINATE) 1 once daily ALPRAZOLAM 1 MG TABS (ALPRAZOLAM) one by mouth three times a day as needed for grief HYDROCHLOROTHIAZIDE 12.5 MG  TABS (HYDROCHLOROTHIAZIDE) Take  1 tablet by mouth once a day SPIRONOLACTONE 25 MG  TABS (SPIRONOLACTONE) 1 once daily HYDROCODONE-ACETAMINOPHEN 10-325 MG TABS (HYDROCODONE-ACETAMINOPHEN) 1/2 to 1 by mouth q 6 hours as needed MICARDIS 40 MG TABS (TELMISARTAN) 1 once daily or as directed PREVACID 30 MG  CPDR (LANSOPRAZOLE) Take 1 tablet by mouth two times a day AMARYL 4 MG  TABS (GLIMEPIRIDE) one p.o. b.i.d. BYETTA 10 MCG PEN 10 MCG/0.04ML SOLN (EXENATIDE) one injection twice daily BD INSULIN SYRINGE HALF-UNIT 31G X 5/16" 0.3 ML MISC (INSULIN SYRINGE-NEEDLE U-100) 7 times a day NITROSTAT 0.4 MG  SUBL (NITROGLYCERIN) Use as directed LEVEMIR FLEXPEN 100 UNIT/ML SOLN (INSULIN DETEMIR) take 30 u  of levemir two times a day unless the fasting sugarsr drop below 100  then cut to 15 BID ONETOUCH ULTRA TEST  STRP (GLUCOSE BLOOD) use 6 times a day NOVOLOG PENFILL 100 UNIT/ML SOLN (INSULIN ASPART) 10 units  before meals or as directed KLOR-CON M20 20 MEQ CR-TABS (POTASSIUM CHLORIDE CRYS CR) take one and one-half tablet daily LIPITOR 20 MG TABS (ATORVASTATIN CALCIUM) 1 once daily WARFARIN SODIUM 6 MG TABS (WARFARIN SODIUM) Take as directed by coumadin clinic. DIGOXIN 0.25 MG TABS (DIGOXIN) Take 1 by mouth once a day GABAPENTIN 300 MG CAPS (GABAPENTIN) 1 three times a day BETAMETHASONE DIPROPIONATE AUG 0.05 % CREA (BETAMETHASONE DIPROPIONATE AUG) apply to rash two times a day SOMA 250 MG TABS (CARISOPRODOL) one by mouth two times a day      Laboratory Results   Blood Tests      INR: 2.9   (Normal Range: 0.88-1.12   Therap INR: 2.0-3.5) Comments: Rita Ohara  October 30, 2009 5:03 PM

## 2010-04-06 NOTE — Assessment & Plan Note (Signed)
Summary: 1 month rov/njr   Vital Signs:  Patient profile:   65 year old female Height:      61 inches Weight:      216 pounds BMI:     40.96 Temp:     98.2 degrees F oral Pulse rate:   72 / minute Resp:     14 per minute BP sitting:   122 / 76  (left arm)  Vitals Entered By: Willy Eddy, LPN (December 04, 2009 4:07 PM) CC: roa- j ust saw dr Everardo All and some of the insulins were changed , Hypertension Management Is Patient Diabetic? Yes   Primary Care Provider:  Stacie Glaze MD  CC:  roa- j ust saw dr Everardo All and some of the insulins were changed  and Hypertension Management.  History of Present Illness: the endocinologist decreased the levamir and increased the novolog the pt has been sitting in offices all day and her back pain is increased she has been seen by Dr [name] and she feels thta he communicated  that her back is "really bad" and there is no safe intervention The pain waxes and wains over weeks at a time Her neurosurgeon was Dr Jeral Fruit Her current pain medications are hydrocodone and flexeril  Hypertension History:      She denies headache, chest pain, palpitations, dyspnea with exertion, orthopnea, PND, peripheral edema, visual symptoms, neurologic problems, syncope, and side effects from treatment.        Positive major cardiovascular risk factors include female age 77 years old or older, diabetes, hyperlipidemia, hypertension, and family history for ischemic heart disease (males less than 57 years old).  Negative major cardiovascular risk factors include non-tobacco-user status.        Positive history for target organ damage include ASHD (either angina/prior MI/prior CABG), cardiac end organ damage (either CHF or LVH), prior stroke (or TIA), and peripheral vascular disease.  Further assessment for target organ damage reveals no history of renal insufficiency or hypertensive retinopathy.     Preventive Screening-Counseling & Management  Alcohol-Tobacco  Smoking Status: never     Passive Smoke Exposure: no     Tobacco Counseling: not indicated; no tobacco use  Problems Prior to Update: 1)  Cervical Radiculopathy, Left  (ICD-723.4) 2)  Shoulder Pain, Left  (ICD-719.41) 3)  Pulmonary Congestion  (ICD-786.9) 4)  Diab w/o Comp Type I [juv] Not Stated Uncntrl  (ICD-250.01) 5)  Rib Pain, Left Sided  (ICD-786.50) 6)  Coronary Artery Disease  (ICD-414.00) 7)  Atrial Fibrillation  (ICD-427.31) 8)  Anticoagulation Rx  (ICD-V58.61) 9)  Hypertension  (ICD-401.9) 10)  Hyperlipidemia Nec/nos  (ICD-272.4) 11)  Gerd  (ICD-530.81) 12)  Hypokalemia  (ICD-276.8) 13)  Diabetes Mellitus, Type II  (ICD-250.00) 14)  Depression  (ICD-311) 15)  Grief Reaction, Acute  (ICD-309.0) 16)  Acute Bronchitis  (ICD-466.0) 17)  Back Pain, Lumbar, With Radiculopathy  (ICD-724.4) 18)  Lbbb  (ICD-426.3) 19)  Headache  (ICD-784.0) 20)  Knee Pain, Right  (ICD-719.46) 21)  Costochondritis, Acute  (ICD-733.6) 22)  Chronic Lymphadenitis  (ICD-289.1) 23)  Urinary Frequency  (ICD-788.41) 24)  Osteoarthritis  (ICD-715.90) 25)  Bursitis, Acromioclavicular, Left  (ICD-726.10) 26)  Family History Diabetes 1st Degree Relative  (ICD-V18.0)  Current Problems (verified): 1)  Cervical Radiculopathy, Left  (ICD-723.4) 2)  Shoulder Pain, Left  (ICD-719.41) 3)  Pulmonary Congestion  (ICD-786.9) 4)  Diab w/o Comp Type I [juv] Not Stated Uncntrl  (ICD-250.01) 5)  Rib Pain, Left Sided  (ICD-786.50) 6)  Coronary Artery Disease  (ICD-414.00) 7)  Atrial Fibrillation  (ICD-427.31) 8)  Anticoagulation Rx  (ICD-V58.61) 9)  Hypertension  (ICD-401.9) 10)  Hyperlipidemia Nec/nos  (ICD-272.4) 11)  Gerd  (ICD-530.81) 12)  Hypokalemia  (ICD-276.8) 13)  Diabetes Mellitus, Type II  (ICD-250.00) 14)  Depression  (ICD-311) 15)  Grief Reaction, Acute  (ICD-309.0) 16)  Acute Bronchitis  (ICD-466.0) 17)  Back Pain, Lumbar, With Radiculopathy  (ICD-724.4) 18)  Lbbb  (ICD-426.3) 19)  Headache   (ICD-784.0) 20)  Knee Pain, Right  (ICD-719.46) 21)  Costochondritis, Acute  (ICD-733.6) 22)  Chronic Lymphadenitis  (ICD-289.1) 23)  Urinary Frequency  (ICD-788.41) 24)  Osteoarthritis  (ICD-715.90) 25)  Bursitis, Acromioclavicular, Left  (ICD-726.10) 26)  Family History Diabetes 1st Degree Relative  (ICD-V18.0)  Medications Prior to Update: 1)  Toprol Xl 100 Mg Xr24h-Tab (Metoprolol Succinate) .Marland Kitchen.. 1 Once Daily 2)  Alprazolam 1 Mg Tabs (Alprazolam) .... One By Mouth Three Times A Day As Needed For Grief 3)  Hydrochlorothiazide 12.5 Mg  Tabs (Hydrochlorothiazide) .... Take 1 Tablet By Mouth Once A Day 4)  Spironolactone 25 Mg  Tabs (Spironolactone) .Marland Kitchen.. 1 Once Daily 5)  Hydrocodone-Acetaminophen 10-325 Mg Tabs (Hydrocodone-Acetaminophen) .... 1/2 To 1 By Mouth Q 6 Hours As Needed 6)  Micardis 40 Mg Tabs (Telmisartan) .Marland Kitchen.. 1 Once Daily or As Directed 7)  Prevacid 30 Mg  Cpdr (Lansoprazole) .... Take 1 Tablet By Mouth Two Times A Day 8)  Byetta 10 Mcg Pen 10 Mcg/0.74ml Soln (Exenatide) .... One Injection Twice Daily 9)  Bd Insulin Syringe Half-Unit 31g X 5/16" 0.3 Ml Misc (Insulin Syringe-Needle U-100) .... 7 Times A Day 10)  Nitrostat 0.4 Mg  Subl (Nitroglycerin) .... Use As Directed 11)  Levemir Flexpen 100 Unit/ml Soln (Insulin Detemir) .... Take 10 Units At Bedtime 12)  Onetouch Ultra Test  Strp (Glucose Blood) .... Use 6 Times A Day 13)  Novolog Penfill 100 Unit/ml Soln (Insulin Aspart) .... 30 Units Three Times A Day (Just Before Each Meal) 14)  Klor-Con M20 20 Meq Cr-Tabs (Potassium Chloride Crys Cr) .... Take One and One-Half Tablet Daily 15)  Lipitor 20 Mg Tabs (Atorvastatin Calcium) .Marland Kitchen.. 1 Once Daily 16)  Warfarin Sodium 6 Mg Tabs (Warfarin Sodium) .... Take As Directed By Coumadin Clinic. 17)  Digoxin 0.25 Mg Tabs (Digoxin) .... Take 1 By Mouth Once A Day 18)  Gabapentin 300 Mg Caps (Gabapentin) .Marland Kitchen.. 1 Three Times A Day 19)  Cyclobenzaprine Hcl 10 Mg Tabs (Cyclobenzaprine Hcl)  .Marland Kitchen.. 1 Tablet By Mouth Two Times A Day  Current Medications (verified): 1)  Toprol Xl 100 Mg Xr24h-Tab (Metoprolol Succinate) .Marland Kitchen.. 1 Once Daily 2)  Alprazolam 1 Mg Tabs (Alprazolam) .... One By Mouth Three Times A Day As Needed For Grief 3)  Hydrochlorothiazide 12.5 Mg  Tabs (Hydrochlorothiazide) .... Take 1 Tablet By Mouth Once A Day 4)  Spironolactone 25 Mg  Tabs (Spironolactone) .Marland Kitchen.. 1 Once Daily 5)  Hydrocodone-Acetaminophen 10-325 Mg Tabs (Hydrocodone-Acetaminophen) .... 1/2 To 1 By Mouth Q 6 Hours As Needed 6)  Micardis 40 Mg Tabs (Telmisartan) .Marland Kitchen.. 1 Once Daily or As Directed 7)  Prevacid 30 Mg  Cpdr (Lansoprazole) .... Take 1 Tablet By Mouth Two Times A Day 8)  Byetta 10 Mcg Pen 10 Mcg/0.71ml Soln (Exenatide) .... One Injection Twice Daily 9)  Bd Insulin Syringe Half-Unit 31g X 5/16" 0.3 Ml Misc (Insulin Syringe-Needle U-100) .... 7 Times A Day 10)  Nitrostat 0.4 Mg  Subl (Nitroglycerin) .... Use As Directed 11)  Levemir Flexpen 100 Unit/ml Soln (Insulin Detemir) .... Take 10 Units At Bedtime 12)  Onetouch Ultra Test  Strp (Glucose Blood) .... Use 6 Times A Day 13)  Novolog Penfill 100 Unit/ml Soln (Insulin Aspart) .... 30 Units Three Times A Day (Just Before Each Meal) 14)  Klor-Con M20 20 Meq Cr-Tabs (Potassium Chloride Crys Cr) .... Take One and One-Half Tablet Daily 15)  Lipitor 20 Mg Tabs (Atorvastatin Calcium) .Marland Kitchen.. 1 Once Daily 16)  Warfarin Sodium 6 Mg Tabs (Warfarin Sodium) .... Take As Directed By Coumadin Clinic. 17)  Digoxin 0.25 Mg Tabs (Digoxin) .... Take 1 By Mouth Once A Day 18)  Gabapentin 300 Mg Caps (Gabapentin) .Marland Kitchen.. 1 Three Times A Day 19)  Cyclobenzaprine Hcl 10 Mg Tabs (Cyclobenzaprine Hcl) .Marland Kitchen.. 1 Tablet By Mouth Two Times A Day 20)  Lidoderm 5 % Ptch (Lidocaine) .... Apply To Site 1-3 Patches 12 Hours On and 12 Hours Off  Allergies (verified): 1)  ! Sulfa 2)  ! Morphine 3)  ! Talwin 4)  ! Codeine 5)  ! Vibramycin  Past History:  Family History: Last  updated: 11/12/2009 Family History of Stroke F 1st degree relative <60 Fam hx OA Family History Diabetes 1st degree relative Family History Hypertension Father:mi age 70 died Family History of Renal Disease:   Social History: Last updated: 07/15/2009 Married Retired Never Smoked  Risk Factors: Exercise: no (01/29/2007)  Risk Factors: Smoking Status: never (12/04/2009) Passive Smoke Exposure: no (12/04/2009)  Past medical, surgical, family and social histories (including risk factors) reviewed, and no changes noted (except as noted below).  Past Medical History: Reviewed history from 07/15/2009 and no changes required. Current Problems:  CORONARY ARTERY DISEASE (ICD-414.00) ATRIAL FIBRILLATION (ICD-427.31) ANTICOAGULATION RX (ICD-V58.61) HYPERTENSION (ICD-401.9) HYPERLIPIDEMIA NEC/NOS (ICD-272.4) GERD (ICD-530.81) HYPOKALEMIA (ICD-276.8) DIABETES MELLITUS, TYPE II (ICD-250.00) DEPRESSION (ICD-311) GRIEF REACTION, ACUTE (ICD-309.0) ACUTE BRONCHITIS (ICD-466.0) BACK PAIN, LUMBAR, WITH RADICULOPATHY (ICD-724.4) LBBB (ICD-426.3) HEADACHE (ICD-784.0) KNEE PAIN, RIGHT (ICD-719.46) COSTOCHONDRITIS, ACUTE (ICD-733.6) CHRONIC LYMPHADENITIS (ICD-289.1) URINARY FREQUENCY (ICD-788.41) OSTEOARTHRITIS (ICD-715.90) BURSITIS, ACROMIOCLAVICULAR, LEFT (ICD-726.10) FAMILY HISTORY DIABETES 1ST DEGREE RELATIVE (ICD-V18.0)  Past Surgical History: Reviewed history from 07/15/2009 and no changes required. angioplasty-06/2001 Lumbar laminectomy Coronary artery bypass graft LAD feb 27,2004 Tonsillectomy Cholecystectomy Hysterectomy  Family History: Reviewed history from 11/12/2009 and no changes required. Family History of Stroke F 1st degree relative <60 Fam hx OA Family History Diabetes 1st degree relative Family History Hypertension Father:mi age 53 died Family History of Renal Disease:   Social History: Reviewed history from 07/15/2009 and no changes  required. Married Retired Never Smoked  Review of Systems  The patient denies anorexia, fever, weight loss, weight gain, vision loss, decreased hearing, hoarseness, chest pain, syncope, dyspnea on exertion, peripheral edema, prolonged cough, headaches, hemoptysis, abdominal pain, melena, hematochezia, severe indigestion/heartburn, hematuria, incontinence, genital sores, muscle weakness, suspicious skin lesions, transient blindness, difficulty walking, depression, unusual weight change, abnormal bleeding, enlarged lymph nodes, angioedema, and breast masses.    Physical Exam  General:  alert and overweight-appearing.   Head:  normocephalic and atraumatic.   Eyes:  pupils equal and pupils round.   Ears:  R ear normal and L ear normal.   Nose:  no external deformity and no nasal discharge.   Neck:  supple and decreased ROM.   Lungs:  normal respiratory effort and no wheezes.   Heart:  normal rate and regular rhythm.   Abdomen:  Bowel sounds positive,abdomen soft and non-tender without masses, organomegaly or hernias noted. Msk:  lumbar lordosis, SI joint  tenderness, and trigger point tenderness.   neck tight and tender Pulses:  R and L carotid,radial,femoral,dorsalis pedis and posterior tibial pulses are full and equal bilaterally Extremities:  trace left pedal edema and trace right pedal edema.   Neurologic:  alert & oriented X3 and cranial nerves II-XII intact.     Impression & Recommendations:  Problem # 1:  DIAB W/O COMP TYPE I [JUV] NOT STATED UNCNTRL (ICD-250.01) Assessment Deteriorated reviewed the endocrinology note with pt Her updated medication list for this problem includes:    Micardis 40 Mg Tabs (Telmisartan) .Marland Kitchen... 1 once daily or as directed    Byetta 10 Mcg Pen 10 Mcg/0.69ml Soln (Exenatide) ..... One injection twice daily    Levemir Flexpen 100 Unit/ml Soln (Insulin detemir) .Marland Kitchen... Take 10 units at bedtime    Novolog Penfill 100 Unit/ml Soln (Insulin aspart) .Marland KitchenMarland KitchenMarland KitchenMarland Kitchen 30  units three times a day (just before each meal)  Labs Reviewed: Creat: 0.80 (10/30/2009)    Reviewed HgBA1c results: 11.4 (10/30/2009)  7.6 (02/02/2009)  Problem # 2:  HYPERTENSION (ICD-401.9) stable Her updated medication list for this problem includes:    Toprol Xl 100 Mg Xr24h-tab (Metoprolol succinate) .Marland Kitchen... 1 once daily    Hydrochlorothiazide 12.5 Mg Tabs (Hydrochlorothiazide) .Marland Kitchen... Take 1 tablet by mouth once a day    Spironolactone 25 Mg Tabs (Spironolactone) .Marland Kitchen... 1 once daily    Micardis 40 Mg Tabs (Telmisartan) .Marland Kitchen... 1 once daily or as directed  BP today: 122/76 Prior BP: 122/78 (12/04/2009)  Prior 10 Yr Risk Heart Disease: N/A (11/22/2006)  Labs Reviewed: K+: 3.9 (10/30/2009) Creat: : 0.80 (10/30/2009)   Chol: 121 (08/21/2009)   HDL: 32.80 (08/21/2009)   LDL: 63 (08/21/2009)   TG: 124.0 (08/21/2009)  Problem # 3:  BACK PAIN, LUMBAR, WITH RADICULOPATHY (ICD-724.4) allergic to morphine Her updated medication list for this problem includes:    Hydrocodone-acetaminophen 10-325 Mg Tabs (Hydrocodone-acetaminophen) .Marland Kitchen... 1/2 to 1 by mouth q 6 hours as needed    Cyclobenzaprine Hcl 10 Mg Tabs (Cyclobenzaprine hcl) .Marland Kitchen... 1 tablet by mouth two times a day  Discussed use of moist heat or ice, modified activities, medications, and stretching/strengthening exercises. Back care instructions given. To be seen in 2 weeks if no improvement; sooner if worsening of symptoms.   Problem # 4:  CERVICAL RADICULOPATHY, LEFT (ICD-723.4)  Complete Medication List: 1)  Toprol Xl 100 Mg Xr24h-tab (Metoprolol succinate) .Marland Kitchen.. 1 once daily 2)  Alprazolam 1 Mg Tabs (Alprazolam) .... One by mouth three times a day as needed for grief 3)  Hydrochlorothiazide 12.5 Mg Tabs (Hydrochlorothiazide) .... Take 1 tablet by mouth once a day 4)  Spironolactone 25 Mg Tabs (Spironolactone) .Marland Kitchen.. 1 once daily 5)  Hydrocodone-acetaminophen 10-325 Mg Tabs (Hydrocodone-acetaminophen) .... 1/2 to 1 by mouth q 6 hours  as needed 6)  Micardis 40 Mg Tabs (Telmisartan) .Marland Kitchen.. 1 once daily or as directed 7)  Prevacid 30 Mg Cpdr (Lansoprazole) .... Take 1 tablet by mouth two times a day 8)  Byetta 10 Mcg Pen 10 Mcg/0.54ml Soln (Exenatide) .... One injection twice daily 9)  Bd Insulin Syringe Half-unit 31g X 5/16" 0.3 Ml Misc (Insulin syringe-needle u-100) .... 7 times a day 10)  Nitrostat 0.4 Mg Subl (Nitroglycerin) .... Use as directed 11)  Levemir Flexpen 100 Unit/ml Soln (Insulin detemir) .... Take 10 units at bedtime 12)  Onetouch Ultra Test Strp (Glucose blood) .... Use 6 times a day 13)  Novolog Penfill 100 Unit/ml Soln (Insulin aspart) .... 30 units three times  a day (just before each meal) 14)  Klor-con M20 20 Meq Cr-tabs (Potassium chloride crys cr) .... Take one and one-half tablet daily 15)  Lipitor 20 Mg Tabs (Atorvastatin calcium) .Marland Kitchen.. 1 once daily 16)  Warfarin Sodium 6 Mg Tabs (Warfarin sodium) .... Take as directed by coumadin clinic. 17)  Digoxin 0.25 Mg Tabs (Digoxin) .... Take 1 by mouth once a day 18)  Gabapentin 300 Mg Caps (Gabapentin) .Marland Kitchen.. 1 three times a day 19)  Cyclobenzaprine Hcl 10 Mg Tabs (Cyclobenzaprine hcl) .Marland Kitchen.. 1 tablet by mouth two times a day 20)  Lidoderm 5 % Ptch (Lidocaine) .... Apply to site 1-3 patches 12 hours on and 12 hours off  Hypertension Assessment/Plan:      The patient's hypertensive risk group is category C: Target organ damage and/or diabetes.  Today's blood pressure is 122/76.  Her blood pressure goal is < 130/80.  Patient Instructions: 1)  try the lidoderm patches 2)  Please schedule a follow-up appointment in 2 months. Prescriptions: HYDROCODONE-ACETAMINOPHEN 10-325 MG TABS (HYDROCODONE-ACETAMINOPHEN) 1/2 to 1 by mouth q 6 hours as needed  #30 x 1   Entered by:   Willy Eddy, LPN   Authorized by:   Stacie Glaze MD   Signed by:   Willy Eddy, LPN on 93/26/7124   Method used:   Telephoned to ...       [facility] [address] [phone]* (retail)        [address]       [place], [place]  [address]       Ph: [phone] or [phone]       Fax: [phone]   RxID:   [id number] LIDODERM 5 % PTCH (LIDOCAINE) apply to site 1-3 patches 12 hours on and 12 hours off  #90 x 0   Entered and Authorized by:   Stacie Glaze MD   Signed by:   Stacie Glaze MD on 12/04/2009   Method used:   [facility]ron[facility]        [facility] [address] [phone]* (retail)       [address]       [place], [place]  [address]       Ph: [phone] or [phone]       Fax: [phone]   RxID:   [id number]

## 2010-04-06 NOTE — Assessment & Plan Note (Signed)
Summary: Kristen [name]   Visit Type:  6 months follow up Primary Provider:  Stacie Glaze MD  CC:  Pt. having hard time going to sleep.  History of Present Illness: She is increasingly forgetfull.   She got Lexapro, and felt like her body was separating. Still tearful every day.  She writes to her grandson every day.  Current Medications (verified): 1)  Toprol Xl 100 Mg Xr24h-Tab (Metoprolol Succinate) .Marland Kitchen.. 1 Once Daily 2)  Alprazolam 1 Mg Tabs (Alprazolam) .... One By Mouth Three Times A Day As Needed For Grief 3)  Hydrochlorothiazide 12.5 Mg  Tabs (Hydrochlorothiazide) .... Take 1 Tablet By Mouth Once A Day 4)  Spironolactone 25 Mg  Tabs (Spironolactone) .Marland Kitchen.. 1 Once Daily 5)  Hydrocodone-Acetaminophen 10-325 Mg Tabs (Hydrocodone-Acetaminophen) .... 1/2 To 1 By Mouth Q 6 Hours As Needed 6)  Micardis 40 Mg Tabs (Telmisartan) .Marland Kitchen.. 1 Once Daily or As Directed 7)  Prevacid 30 Mg  Cpdr (Lansoprazole) .... Take 1 Tablet By Mouth Two Times A Day 8)  Amaryl 4 Mg  Tabs (Glimepiride) .... One P.o. B.i.d. 9)  Byetta 10 Mcg Pen 10 Mcg/0.49ml Soln (Exenatide) .... One Injection Twice Daily 10)  Bd Insulin Syringe Half-Unit 31g X 5/16" 0.3 Ml Misc (Insulin Syringe-Needle U-100) .... 7 Times A Day 11)  Nitrostat 0.4 Mg  Subl (Nitroglycerin) .... Use As Directed 12)  Levemir Flexpen 100 Unit/ml Soln (Insulin Detemir) .... Take 20 U  of Levemir Two Times A Day Unless The Fasting Sugarsr Drop Below 100  Then Cut To 15 Bid 13)  Onetouch Ultra Test  Strp (Glucose Blood) .... Use 6 Times A Day 14)  Novolog Penfill 100 Unit/ml Soln (Insulin Aspart) .Marland Kitchen.. 10 Units  Before Meals or As Directed 15)  Klor-Con M20 20 Meq Cr-Tabs (Potassium Chloride Crys Cr) .... Take One and One-Half Tablet Daily 16)  Lipitor 20 Mg Tabs (Atorvastatin Calcium) .Marland Kitchen.. 1 Once Daily 17)  Warfarin Sodium 6 Mg Tabs (Warfarin Sodium) .... Take As Directed By Coumadin Clinic. 18)  Digoxin 0.25 Mg Tabs (Digoxin) .... Take 1 By Mouth Once A Day 19)   Gabapentin 300 Mg Caps (Gabapentin) .Marland Kitchen.. 1 Three Times A Day 20)  Betamethasone Dipropionate Aug 0.05 % Crea (Betamethasone Dipropionate Aug) .... Apply To Rash Two Times A Day  Allergies: 1)  ! Sulfa 2)  ! Morphine 3)  ! Talwin 4)  ! Codeine 5)  ! Vibramycin  Vital Signs:  Patient profile:   65 year old female Height:      61 inches Weight:      215.25 pounds BMI:     40.82 Pulse rate:   67 / minute Pulse rhythm:   regular Resp:     18 per minute BP sitting:   120 / 84  (left arm) Cuff size:   large  Vitals Entered By: Vikki Ports (Jul 16, 2009 3:29 PM) CC: Pt. having hard time going to sleep Comments INR today 2.1   Physical Exam  General:  Well developed, well nourished, in no acute distress. Head:  normocephalic and atraumatic Eyes:  PERRLA/EOM intact; conjunctiva and lids normal. Lungs:  Clear bilaterally to auscultation and percussion. Heart:  PMI non displaced. paradoxical Splitting of S2.  Normal S1. Pulses:  pulses normal in all 4 extremities Extremities:  No clubbing or cyanosis. Neurologic:  Alert and oriented x 3.   EKG  Procedure date:  07/16/2009  Findings:      NSR.  LBBB.  Impression & Recommendations:  Problem # 1:  GRIEF REACTION, ACUTE (ICD-309.0) Long discussion today, more than thirty minutes. regarding issues related to death of grandson.  Patient appropriately despondent.    Problem # 2:  CORONARY ARTERY DISEASE (ICD-414.00) Seems stable at present. Her updated medication list for this problem includes:    Toprol Xl 100 Mg Xr24h-tab (Metoprolol succinate) .Marland Kitchen... 1 once daily    Nitrostat 0.4 Mg Subl (Nitroglycerin) ..... Use as directed    Warfarin Sodium 6 Mg Tabs (Warfarin sodium) .Marland Kitchen... Take as directed by coumadin clinic.  Orders: EKG w/ Interpretation (93000)  Problem # 3:  HYPERLIPIDEMIA NEC/NOS (ICD-272.4) followed by primary care. Her updated medication list for this problem includes:    Lipitor 20 Mg Tabs (Atorvastatin  calcium) .Marland Kitchen... 1 once daily  Patient Instructions: 1)  Your physician wants you to follow-up in:  6 MONTHS.  You will receive a reminder letter in the mail two months in advance. If you don't receive a letter, please call our office to schedule the follow-up appointment. 2)  Your physician recommends that you return for a FASTING LIPID, LIVER and BMP (414.01, 250.0, 272.0)--Nothing to eat or drink after midnight  3)  Your physician recommends that you continue on your current medications as directed. Please refer to the Current Medication list given to you today.

## 2010-04-06 NOTE — Progress Notes (Signed)
Summary: Med substitute  Phone Note Call from Patient Call back at [phone]   Caller: Patient Reason for Call: Refill Medication Summary of Call: Patient needs refill of Micardis 20 mg - patient states a 30 day supply is $85.00 and she cannot afford that.  Is there any other med she can take that will be cheaper.  [facility] - she needs to get something today. Initial call taken by: Everrett Coombe,  June 05, 2009 10:01 AM    New/Updated Medications: MICARDIS 40 MG TABS (TELMISARTAN) 1 once daily or as directed Prescriptions: MICARDIS 40 MG TABS (TELMISARTAN) 1 once daily or as directed  #30 x 3   Entered by:   Willy Eddy, LPN   Authorized by:   Stacie Glaze MD   Signed by:   Willy Eddy, LPN on 09/81/1914   Method used:   [facility] to        [facility] [address] [phone]* (retail)       [address]       [place], [place]  [address]       Ph: [phone] or [phone]       Fax: [phone]   RxID:   [id number]

## 2010-04-06 NOTE — Medication Information (Signed)
Summary: rov/sp  Anticoagulant Therapy  Managed by: Kristen [name], PharmD Referring MD: Shawnie Pons MD PCP: Kristen Glaze MD Supervising MD: Clifton James MD, Cristal Deer Indication 1: CVA-stroke (ICD-436) Lab Used: [facility] Site: [facility] INR POC 1.9 INR RANGE 2 - 3  Dietary changes: no    Health status changes: no    Bleeding/hemorrhagic complications: no    Recent/future hospitalizations: no    Any changes in medication regimen? no    Recent/future dental: no  Any missed doses?: no       Is patient compliant with meds? yes      Comments: If pt is low at next visit in July, consider changing to 4 mg daily except 2 mg on one day to get 26 mg weekly dose.  Allergies: 1)  ! Sulfa 2)  ! Morphine 3)  ! Talwin 4)  ! Codeine 5)  ! Vibramycin  Anticoagulation Management History:      The patient is taking warfarin and comes in today for a routine follow up visit.  The patient is on warfarin for chronic atrial fibrillation.  Positive risk factors for bleeding include history of CVA/TIA and presence of serious comorbidities.  Negative risk factors for bleeding include an age less than 45 years old.  The bleeding index is 'intermediate risk'.  Positive CHADS2 values include History of HTN, History of Diabetes, and Prior Stroke/CVA/TIA.  Negative CHADS2 values include Age > 76 years old.  The start date was 07/06/2002.  Plans are to continue warfarin for life.  Her last INR was 4.2.  Anticoagulation responsible provider: Clifton James MD, Cristal Deer.  INR POC: 1.9.  Cuvette Lot#: [id number].  Exp: 10/2010.    Anticoagulation Management Assessment/Plan:      The patient's current anticoagulation dose is Warfarin sodium 6 mg tabs: Take as directed by coumadin clinic..  The target INR is 2 - 3.  The next INR is due 09/11/2009.  Anticoagulation instructions were given to patient.  Results were reviewed/authorized by Kristen [name], PharmD.  She was notified by Kristen [name], PharmD.     Prior Anticoagulation Instructions: INR 2.1  Continue same dose of 1/2 tablet every day except 1 tablet on Monday   Current Anticoagulation Instructions: INR 1.9. Take 1 tablet today, then take 0.5 tablet daily except 1 tablet on Mon.  Recheck in 4 weeks.

## 2010-04-06 NOTE — Progress Notes (Signed)
Summary: rx   Phone Note Call from Patient Call back at Home Phone [phone]   Caller: patient triage message Call For: Kristen [name] Summary of Call: Kristen [name] gave samples of Micardis 80 needs rx called in to [facility]  Initial call taken by: Roselle Locus,  April 10, 2007 11:17 AM      Prescriptions: MICARDIS 80 MG  TABS (TELMISARTAN) once daily  #30 x 6   Entered by:   Willy Eddy, LPN   Authorized by:   Stacie Glaze MD   Signed by:   Willy Eddy, LPN on 30/16/0109   Method used:   Electronically [facility] to ...       [facility] [address] [phone]*       [address]       [place], [place]  [address]       Ph: [phone] or [phone]       Fax: [phone]   RxID:   [id number]

## 2010-04-06 NOTE — Medication Information (Signed)
Summary: ccr/ gd  Anticoagulant Therapy  Managed by: Weston Brass, PharmD Referring MD: Shawnie Pons MD PCP: Stacie Glaze MD Supervising MD: Johney Frame MD, Fayrene Fearing Indication 1: CVA-stroke (ICD-436) Lab Used: [facility] Site: [facility] INR POC 2.1 INR RANGE 2 - 3  Dietary changes: no    Health status changes: no    Bleeding/hemorrhagic complications: no    Recent/future hospitalizations: no    Any changes in medication regimen? no    Recent/future dental: no  Any missed doses?: yes     Details: may have missed 1 dose but not sure  Is patient compliant with meds? yes       Allergies: 1)  ! Sulfa 2)  ! Morphine 3)  ! Talwin 4)  ! Codeine 5)  ! Vibramycin  Anticoagulation Management History:      The patient is taking warfarin and comes in today for a routine follow up visit.  The patient is taking warfarin for chronic atrial fibrillation.  Positive risk factors for bleeding include history of CVA/TIA and presence of serious comorbidities.  Negative risk factors for bleeding include an age less than 77 years old.  The bleeding index is 'intermediate risk'.  Positive CHADS2 values include History of HTN, History of Diabetes, and Prior Stroke/CVA/TIA.  Negative CHADS2 values include Age > 26 years old.  The start date was 07/06/2002.  Plans are to continue warfarin for life.  Her last INR was 4.2.  Anticoagulation responsible provider: Sevin Farone MD, Fayrene Fearing.  INR POC: 2.1.  Cuvette Lot#: [id number].  Exp: 10/2010.    Anticoagulation Management Assessment/Plan:      The patient's current anticoagulation dose is Warfarin sodium 6 mg tabs: Take as directed by coumadin clinic..  The target INR is 2 - 3.  The next INR is due 08/13/2009.  Anticoagulation instructions were given to patient.  Results were reviewed/authorized by Weston Brass, PharmD.  She was notified by Weston Brass PharmD.         Prior Anticoagulation Instructions: INR 3.4  Skip today's dosage of coumadin then start taking 1/2  tablet daily except 1 tablet on Mondays.  Recheck in 2-3 weeks.    Current Anticoagulation Instructions: INR 2.1  Continue same dose of 1/2 tablet every day except 1 tablet on Monday  Prescriptions: WARFARIN SODIUM 6 MG TABS (WARFARIN SODIUM) Take as directed by coumadin clinic.  #30 x 3   Entered by:   Weston Brass PharmD   Authorized by:   Ronaldo Miyamoto, MD, [facility]   Signed by:   Weston Brass PharmD on 07/16/2009   Method used:   Electronically to        [facility] [address] [phone]* (retail)       [address]       [place], [place]  [address]       Ph: [phone] or [phone]       Fax: [phone]   RxID:   [id number]

## 2010-04-06 NOTE — Progress Notes (Signed)
Summary: How to take novolog 70/30 sample j pt  Phone Note Call from Patient Call back at Home Phone [phone]   Caller: vm Summary of Call: Dr. Shela Commons gave Novolog mix 70-30.  Normally take Novalog without the mix.  It's all I have & in the donut hole.  Don't understand about the mix.  Need the insulin & am going to take it in the meantime.  I will also give Dr. Everardo All a ring.   Has taken 10 units of the 70/42mix one time only at 11:55.  FBS 217.  Will be eating soon.  Needs directions on how to take the mix.  [name], RN  November 13, 2009 12:21 PM  Initial call taken by: [name], RN,  November 13, 2009 12:08 PM  Follow-up for Phone Call        would check with Dr Everardo All since he has seen for diabetes and made some changes in regimen. Follow-up by: Evelena Peat MD,  November 13, 2009 12:46 PM  Additional Follow-up for Phone Call Additional follow up Details #1::        Patient advised.  She has called Dr. George Hugh office with no response.  [name], RN  November 13, 2009 1:50 PM Dr. George Hugh triage nurse called to confirm & ask questions.  She will ask Dr. Everardo All.  Have located a sample NovoLog pen that we can give her.   Raelene Bott Spell, RN  November 13, 2009 2:07 PM She will try to get her husband come for the sample NovoLog pen.    Additional Follow-up by: [name], RN,  November 13, 2009 2:22 PM

## 2010-04-06 NOTE — Progress Notes (Signed)
Summary: arm swelling  Phone Note Call from Patient Call back at Home Phone [phone]   Caller: Patient Call For: Stacie Glaze MD Reason for Call: Talk to Nurse, Talk to Doctor Details for Reason: left arm Summary of Call: patient is calling because her left arm is swelling and with some pain.  she is also having some left side and shoulder pain for about a month. Initial call taken by: Kern Reap CMA Duncan Dull),  September 29, 2009 4:39 PM  Follow-up for Phone Call        same dx as was here on July11 and amirix did help- per [name] call in amirix and get shoulder xrays- pt informed  Follow-up by: Willy Eddy, LPN,  September 29, 2009 4:45 PM  New Problems: SHOULDER PAIN, LEFT (ICD-719.41)   New Problems: SHOULDER PAIN, LEFT (ICD-719.41) Prescriptions: AMRIX 15 MG XR24H-CAP (CYCLOBENZAPRINE HCL) one by mouth daily prn  #30 x 1   Entered by:   Willy Eddy, LPN   Authorized by:   Stacie Glaze MD   Signed by:   Willy Eddy, LPN on 62/13/0865   Method used:   Electronically to        [facility] [address] [phone]* (retail)       [address]       [place], [place]  [address]       Ph: [phone] or [phone]       Fax: [phone]   RxID:   [id number]

## 2010-04-06 NOTE — Letter (Signed)
Summary: [facility]  [facility]   Imported By: Maryln Gottron 03/11/2009 12:44:53  _____________________________________________________________________  External Attachment:    Type:   Image     Comment:   External Document

## 2010-04-06 NOTE — Progress Notes (Signed)
Summary: Feels heart is out of rhythm  Phone Note Call from Patient Call back at Home Phone [phone]   Caller: Patient Summary of Call: About 45 min ago the pt had a strange rhythm with no chest pains could feel heart beating out of rhythm Initial call taken by: Judie Grieve,  May 12, 2009 4:28 PM  Follow-up for Phone Call        I spoke with the pt and she had an episode of palpitations that lasted 10-15 seconds.  The pt said her rhythm was irregular.  The pt's heart rate is now regular and running 55-58 bpm.  I told the pt that she most likely went into AFib for a few seconds and then went back to normal rhythm.  The pt said she is under a lot of stress at this time (grandson passed away)  and is not getting very much rest.  The pt has also been drinking Pepsi.  I asked the pt to decrease her caffiene intake and try to find ways to help manage her stress.  I asked the pt to call the office if she had more palpitations.  Pt agrees with plan.  Follow-up by: Julieta Gutting, RN, BSN,  May 12, 2009 4:51 PM

## 2010-04-06 NOTE — Assessment & Plan Note (Signed)
Summary: 6 week fup//ccm   Vital Signs:  Patient profile:   65 year old female Height:      61 inches Weight:      216 pounds BMI:     40.96 Temp:     98.2 degrees F oral Pulse rate:   72 / minute Resp:     14 per minute BP sitting:   110 / 72  (left arm)  Vitals Entered By: Willy Eddy, LPN (Jul 08, 1608 10:28 AM) CC: roa- states she heard a "pop" in her left upper abd area when she bent over--states she stopped lexapro because it made her more nervous, Hypertension Management   Primary Care Provider:  Stacie Glaze MD  CC:  roa- states she heard a "pop" in her left upper abd area when she bent over--states she stopped lexapro because it made her more nervous and Hypertension Management.  History of Present Illness: doing better but still has variability in mood    she notes poor control of the glucoses with CBG's in the 100 to 200 range ( if shje eats in the middle of the night) the pt is frustrasted that she cannot reach 100  we discussed the impact of the late noght night snaks  Suggestions made for snaks tha will  not elevated blood sugars the insomnia is a big issue   Hypertension History:      She denies headache, chest pain, palpitations, dyspnea with exertion, orthopnea, PND, peripheral edema, visual symptoms, neurologic problems, syncope, and side effects from treatment.  at goal.        Positive major cardiovascular risk factors include female age 33 years old or older, diabetes, hyperlipidemia, hypertension, and family history for ischemic heart disease (males less than 50 years old).  Negative major cardiovascular risk factors include non-tobacco-user status.        Positive history for target organ damage include ASHD (either angina/prior MI/prior CABG), cardiac end organ damage (either CHF or LVH), prior stroke (or TIA), and peripheral vascular disease.  Further assessment for target organ damage reveals no history of renal insufficiency or hypertensive  retinopathy.     Preventive Screening-Counseling & Management  Alcohol-Tobacco     Smoking Status: never     Passive Smoke Exposure: no  Problems Prior to Update: 1)  Coronary Artery Disease  (ICD-414.00) 2)  Atrial Fibrillation  (ICD-427.31) 3)  Anticoagulation Rx  (ICD-V58.61) 4)  Hypertension  (ICD-401.9) 5)  Hyperlipidemia Nec/nos  (ICD-272.4) 6)  Gerd  (ICD-530.81) 7)  Hypokalemia  (ICD-276.8) 8)  Diabetes Mellitus, Type II  (ICD-250.00) 9)  Depression  (ICD-311) 10)  Grief Reaction, Acute  (ICD-309.0) 11)  Acute Bronchitis  (ICD-466.0) 12)  Back Pain, Lumbar, With Radiculopathy  (ICD-724.4) 13)  Lbbb  (ICD-426.3) 14)  Headache  (ICD-784.0) 15)  Knee Pain, Right  (ICD-719.46) 16)  Costochondritis, Acute  (ICD-733.6) 17)  Chronic Lymphadenitis  (ICD-289.1) 18)  Urinary Frequency  (ICD-788.41) 19)  Osteoarthritis  (ICD-715.90) 20)  Bursitis, Acromioclavicular, Left  (ICD-726.10) 21)  Family History Diabetes 1st Degree Relative  (ICD-V18.0)  Current Problems (verified): 1)  Grief Reaction, Acute  (ICD-309.0) 2)  Hypokalemia  (ICD-276.8) 3)  Acute Bronchitis  (ICD-466.0) 4)  Back Pain, Lumbar, With Radiculopathy  (ICD-724.4) 5)  Lbbb  (ICD-426.3) 6)  Hypercholesterolemia Iia  (ICD-272.0) 7)  Headache  (ICD-784.0) 8)  Knee Pain, Right  (ICD-719.46) 9)  Costochondritis, Acute  (ICD-733.6) 10)  Chronic Lymphadenitis  (ICD-289.1) 11)  Urinary Frequency  (  OZH-086.57) 12)  Depression  (ICD-311) 13)  Osteoarthritis  (ICD-715.90) 14)  Bursitis, Acromioclavicular, Left  (ICD-726.10) 15)  Hyperlipidemia Nec/nos  (ICD-272.4) 16)  Gerd  (ICD-530.81) 17)  Anticoagulation Rx  (ICD-V58.61) 18)  Atrial Fibrillation  (ICD-427.31) 19)  Diabetes Mellitus, Type II  (ICD-250.00) 20)  Coronary Artery Disease  (ICD-414.00) 21)  Family History Diabetes 1st Degree Relative  (ICD-V18.0) 22)  Hypertension  (ICD-401.9)  Medications Prior to Update: 1)  Toprol Xl 100 Mg Xr24h-Tab  (Metoprolol Succinate) .Marland Kitchen.. 1 Once Daily 2)  Alprazolam 1 Mg Tabs (Alprazolam) .... One By Mouth Three Times A Day As Needed For Grief 3)  Hydrochlorothiazide 12.5 Mg  Tabs (Hydrochlorothiazide) .... Take 1 Tablet By Mouth Once A Day 4)  Spironolactone 25 Mg  Tabs (Spironolactone) .Marland Kitchen.. 1 Once Daily 5)  Hydrocodone-Acetaminophen 10-325 Mg Tabs (Hydrocodone-Acetaminophen) .... 1/2 To 1 By Mouth Q 6 Hours As Needed 6)  Micardis 40 Mg Tabs (Telmisartan) .Marland Kitchen.. 1 Once Daily or As Directed 7)  Prevacid 30 Mg  Cpdr (Lansoprazole) .... Take 1 Tablet By Mouth Two Times A Day 8)  Amaryl 4 Mg  Tabs (Glimepiride) .... One P.o. B.i.d. 9)  Lanoxin 0.25 Mg  Tabs (Digoxin) .Marland Kitchen.. 1 Once Daily 10)  Byetta 10 Mcg Pen 10 Mcg/0.54ml Soln (Exenatide) .... One Injection Twice Daily 11)  Bd Insulin Syringe Half-Unit 31g X 5/16" 0.3 Ml Misc (Insulin Syringe-Needle U-100) .... 7 Times A Day 12)  Nitrostat 0.4 Mg  Subl (Nitroglycerin) .... Use As Directed 13)  Levemir Flexpen 100 Unit/ml Soln (Insulin Detemir) .... Take 20 U  of Levemir Two Times A Day Unless The Fasting Sugarsr Drop Below 100  Then Cut To 15 Bid 14)  Onetouch Ultra Test  Strp (Glucose Blood) .... Use 6 Times A Day 15)  Novolog Penfill 100 Unit/ml Soln (Insulin Aspart) .Marland Kitchen.. 10 Units  Before Meals or As Directed 16)  Klor-Con M20 20 Meq Cr-Tabs (Potassium Chloride Crys Cr) .... Take One and One-Half Tablet Daily 17)  Lipitor 20 Mg Tabs (Atorvastatin Calcium) .Marland Kitchen.. 1 Once Daily 18)  Warfarin Sodium 6 Mg Tabs (Warfarin Sodium) .... Take As Directed By [facility]mad[facility] Clinic. 19)  Digoxin 0.25 Mg Tabs (Digoxin) .... Take 1 By Mouth Once A Day 20)  Gabapentin 300 Mg Caps (Gabapentin) .Marland Kitchen.. 1 Three Times A Day 21)  Lexapro 10 Mg Tabs (Escitalopram Oxalate) .... One  By Mouth Daily 22)  Betamethasone Dipropionate Aug 0.05 % Crea (Betamethasone Dipropionate Aug) .... Apply To Rash Two Times A Day 23)  Fluconazole 150 Mg Tabs (Fluconazole) .... One By Mouth Now and Repeat in  5 Days  Current Medications (verified): 1)  Toprol Xl 100 Mg Xr24h-Tab (Metoprolol Succinate) .Marland Kitchen.. 1 Once Daily 2)  Alprazolam 1 Mg Tabs (Alprazolam) .... One By Mouth Three Times A Day As Needed For Grief 3)  Hydrochlorothiazide 12.5 Mg  Tabs (Hydrochlorothiazide) .... Take 1 Tablet By Mouth Once A Day 4)  Spironolactone 25 Mg  Tabs (Spironolactone) .Marland Kitchen.. 1 Once Daily 5)  Hydrocodone-Acetaminophen 10-325 Mg Tabs (Hydrocodone-Acetaminophen) .... 1/2 To 1 By Mouth Q 6 Hours As Needed 6)  Micardis 40 Mg Tabs (Telmisartan) .Marland Kitchen.. 1 Once Daily or As Directed 7)  Prevacid 30 Mg  Cpdr (Lansoprazole) .... Take 1 Tablet By Mouth Two Times A Day 8)  Amaryl 4 Mg  Tabs (Glimepiride) .... One P.o. B.i.d. 9)  Lanoxin 0.25 Mg  Tabs (Digoxin) .Marland Kitchen.. 1 Once Daily 10)  Byetta 10 Mcg Pen 10 Mcg/0.78ml Soln (Exenatide) .Marland KitchenMarland KitchenMarland Kitchen  One Injection Twice Daily 11)  Bd Insulin Syringe Half-Unit 31g X 5/16" 0.3 Ml Misc (Insulin Syringe-Needle U-100) .... 7 Times A Day 12)  Nitrostat 0.4 Mg  Subl (Nitroglycerin) .... Use As Directed 13)  Levemir Flexpen 100 Unit/ml Soln (Insulin Detemir) .... Take 20 U  of Levemir Two Times A Day Unless The Fasting Sugarsr Drop Below 100  Then Cut To 15 Bid 14)  Onetouch Ultra Test  Strp (Glucose Blood) .... Use 6 Times A Day 15)  Novolog Penfill 100 Unit/ml Soln (Insulin Aspart) .Marland Kitchen.. 10 Units  Before Meals or As Directed 16)  Klor-Con M20 20 Meq Cr-Tabs (Potassium Chloride Crys Cr) .... Take One and One-Half Tablet Daily 17)  Lipitor 20 Mg Tabs (Atorvastatin Calcium) .Marland Kitchen.. 1 Once Daily 18)  Warfarin Sodium 6 Mg Tabs (Warfarin Sodium) .... Take As Directed By [facility]madin Clinic. 19)  Digoxin 0.25 Mg Tabs (Digoxin) .... Take 1 By Mouth Once A Day 20)  Gabapentin 300 Mg Caps (Gabapentin) .Marland Kitchen.. 1 Three Times A Day 21)  Betamethasone Dipropionate Aug 0.05 % Crea (Betamethasone Dipropionate Aug) .... Apply To Rash Two Times A Day  Allergies (verified): 1)  ! Sulfa 2)  ! Morphine 3)  ! Talwin 4)  !  Codeine 5)  ! Vibramycin  Past History:  Family History: Last updated: 03/19/2008 Family History of Stroke F 1st degree relative <60 Fam hx OA Family History Diabetes 1st degree relative Family History Hypertension Father:mi age 39 died Family History of Renal Disease:   Social History: Last updated: 09/13/2006 Married Retired Never Smoked  Risk Factors: Exercise: no (01/29/2007)  Risk Factors: Smoking Status: never (07/08/2009) Passive Smoke Exposure: no (07/08/2009)  Past medical, surgical, family and social histories (including risk factors) reviewed, and no changes noted (except as noted below).  Past Medical History: Reviewed history from 01/14/2009 and no changes required. Palpitation Coronary artery disease, sees Dr. Riley Kill Diabetes mellitus, type II Hypertension atrial fibrilation GERD Depression  Past Surgical History: Reviewed history from 10/03/2006 and no changes required. angioplasty-06/2001 Lumbar laminectomy Coronary artery bypass graft LAD feb 27,2004 Tonsillectomy Cholecystectomy Hysterectomy  Family History: Reviewed history from 03/19/2008 and no changes required. Family History of Stroke F 1st degree relative <60 Fam hx OA Family History Diabetes 1st degree relative Family History Hypertension Father:mi age 64 died Family History of Renal Disease:   Social History: Reviewed history from 09/13/2006 and no changes required. Married Retired Never Smoked  Review of Systems       The patient complains of weight gain.  The patient denies anorexia, fever, vision loss, decreased hearing, hoarseness, chest pain, syncope, dyspnea on exertion, peripheral edema, prolonged cough, headaches, hemoptysis, abdominal pain, melena, hematochezia, severe indigestion/heartburn, hematuria, incontinence, genital sores, muscle weakness, suspicious skin lesions, transient blindness, difficulty walking, depression, unusual weight change, abnormal bleeding,  enlarged lymph nodes, angioedema, and breast masses.    Physical Exam  General:  Well-developed,well-nourished,in no acute distress; alert,appropriate and cooperative throughout examination Head:  Normocephalic and atraumatic without obvious abnormalities. No apparent alopecia or balding. Eyes:  pupils equal and pupils round.   Ears:  R ear normal and L ear normal.   Nose:  no external deformity and no nasal discharge.   Mouth:  Oral mucosa and oropharynx without lesions or exudates.  Teeth in good repair. Neck:  No deformities, masses, or tenderness noted. Lungs:  soft rhonchi, no rales Heart:  Normal.  Paradoxical splitting of the second heart sounds.normal rate.   Abdomen:  Bowel sounds positive,abdomen soft and  non-tender without masses, organomegaly or hernias noted.   Impression & Recommendations:  Problem # 1:  GRIEF REACTION, ACUTE (ICD-309.0)  Problem # 2:  BACK PAIN, LUMBAR, WITH RADICULOPATHY (ICD-724.4)  Her updated medication list for this problem includes:    Hydrocodone-acetaminophen 10-325 Mg Tabs (Hydrocodone-acetaminophen) .Marland Kitchen... 1/2 to 1 by mouth q 6 hours as needed  Discussed use of moist heat or ice, modified activities, medications, and stretching/strengthening exercises. Back care instructions given. To be seen in 2 weeks if no improvement; sooner if worsening of symptoms.   Problem # 3:  DIABETES MELLITUS, TYPE II (ICD-250.00)  Her updated medication list for this problem includes:    Micardis 40 Mg Tabs (Telmisartan) .Marland Kitchen... 1 once daily or as directed    Amaryl 4 Mg Tabs (Glimepiride) ..... One p.o. b.i.d.    Byetta 10 Mcg Pen 10 Mcg/0.87ml Soln (Exenatide) ..... One injection twice daily    Levemir Flexpen 100 Unit/ml Soln (Insulin detemir) .Marland Kitchen... Take 20 u  of levemir two times a day unless the fasting sugarsr drop below 100  then cut to 15 bid    Novolog Penfill 100 Unit/ml Soln (Insulin aspart) .Marland KitchenMarland KitchenMarland KitchenMarland Kitchen 10 units  before meals or as directed  Labs  Reviewed: Creat: 0.8 (02/02/2009)    Reviewed HgBA1c results: 7.6 (02/02/2009)  8.1 (11/28/2008)  Problem # 4:  ATRIAL FIBRILLATION (ICD-427.31) Assessment: Unchanged  stable Her updated medication list for this problem includes:    Toprol Xl 100 Mg Xr24h-tab (Metoprolol succinate) .Marland Kitchen... 1 once daily    Warfarin Sodium 6 Mg Tabs (Warfarin sodium) .Marland Kitchen... Take as directed by coumadin clinic.    Digoxin 0.25 Mg Tabs (Digoxin) .Marland Kitchen... Take 1 by mouth once a day  Reviewed the following: PT: 24.9 (09/25/2008)   INR: 4.2 (09/25/2008) Coumadin Dose (weekly): 24 mg (05/13/2009) Prior Coumadin Dose (weekly): 24 mg (05/13/2009) Next Protime: 06/03/2009 (dated on 05/13/2009)  Problem # 5:  COSTOCHONDRITIS, ACUTE (ICD-733.6)  point injection offered  Discussed medication use, applications of heat or ice, and exercises.   Complete Medication List: 1)  Toprol Xl 100 Mg Xr24h-tab (Metoprolol succinate) .Marland Kitchen.. 1 once daily 2)  Alprazolam 1 Mg Tabs (Alprazolam) .... One by mouth three times a day as needed for grief 3)  Hydrochlorothiazide 12.5 Mg Tabs (Hydrochlorothiazide) .... Take 1 tablet by mouth once a day 4)  Spironolactone 25 Mg Tabs (Spironolactone) .Marland Kitchen.. 1 once daily 5)  Hydrocodone-acetaminophen 10-325 Mg Tabs (Hydrocodone-acetaminophen) .... 1/2 to 1 by mouth q 6 hours as needed 6)  Micardis 40 Mg Tabs (Telmisartan) .Marland Kitchen.. 1 once daily or as directed 7)  Prevacid 30 Mg Cpdr (Lansoprazole) .... Take 1 tablet by mouth two times a day 8)  Amaryl 4 Mg Tabs (Glimepiride) .... One p.o. b.i.d. 9)  Byetta 10 Mcg Pen 10 Mcg/0.72ml Soln (Exenatide) .... One injection twice daily 10)  Bd Insulin Syringe Half-unit 31g X 5/16" 0.3 Ml Misc (Insulin syringe-needle u-100) .... 7 times a day 11)  Nitrostat 0.4 Mg Subl (Nitroglycerin) .... Use as directed 12)  Levemir Flexpen 100 Unit/ml Soln (Insulin detemir) .... Take 20 u  of levemir two times a day unless the fasting sugarsr drop below 100  then cut to 15  bid 13)  Onetouch Ultra Test Strp (Glucose blood) .... Use 6 times a day 14)  Novolog Penfill 100 Unit/ml Soln (Insulin aspart) .Marland Kitchen.. 10 units  before meals or as directed 15)  Klor-con M20 20 Meq Cr-tabs (Potassium chloride crys cr) .... Take one and one-half tablet  daily 16)  Lipitor 20 Mg Tabs (Atorvastatin calcium) .Marland Kitchen.. 1 once daily 17)  Warfarin Sodium 6 Mg Tabs (Warfarin sodium) .... Take as directed by coumadin clinic. 18)  Digoxin 0.25 Mg Tabs (Digoxin) .... Take 1 by mouth once a day 19)  Gabapentin 300 Mg Caps (Gabapentin) .Marland Kitchen.. 1 three times a day 20)  Betamethasone Dipropionate Aug 0.05 % Crea (Betamethasone dipropionate aug) .... Apply to rash two times a day  Hypertension Assessment/Plan:      The patient's hypertensive risk group is category C: Target organ damage and/or diabetes.  Today's blood pressure is 110/72.  Her blood pressure goal is < 130/80.  Patient Instructions: 1)  pimento cheeze or peanut butter on cellery orcarrots, or apple wedges. 2)  Please schedule a follow-up appointment in 2 months. 3)  may use miralax to keep bowles moving Prescriptions: GABAPENTIN 300 MG CAPS (GABAPENTIN) 1 three times a day  #90 Capsule x 2   Entered by:   Willy Eddy, LPN   Authorized by:   Stacie Glaze MD   Signed by:   Willy Eddy, LPN on 16/12/9602   Method used:   Electronically to        [facility] [address] [phone]* (retail)       [address]       [place], [place]  [address]       Ph: [phone] or [phone]       Fax: [phone]   RxID:   [id number]

## 2010-04-06 NOTE — Progress Notes (Signed)
Summary: arm swelling and pain  Phone Note Call from Patient   Caller: Patient Call For: Stacie Glaze MD Summary of Call: Pt is still complaining of left arm swelling  with pain, and feels multiple masses in arm.......Marland KitchenCannot afford the Amirix, and wants to be seen. [phone]  Also, has rash on both legs. Initial call taken by: Lynann Beaver CMA,  October 08, 2009 9:29 AM  Follow-up for Phone Call        talked with pt and wants generic flexeril called in which she states helps pain-ov with dr blyth tomorrow ,but will cancel if feeling better with flexeril Follow-up by: Willy Eddy, LPN,  October 08, 2009 9:52 AM    New/Updated Medications: CYCLOBENZAPRINE HCL 10 MG TABS (CYCLOBENZAPRINE HCL) 1 two times a day Prescriptions: CYCLOBENZAPRINE HCL 10 MG TABS (CYCLOBENZAPRINE HCL) 1 two times a day  #60 x 1   Entered by:   Willy Eddy, LPN   Authorized by:   Stacie Glaze MD   Signed by:   Willy Eddy, LPN on 21/30/8657   Method used:   [facility]        [facility] [address] [phone]* (retail)       [address]       [place], [place]  [address]       Ph: [phone] or [phone]       Fax: [phone]   RxID:   [id number]

## 2010-04-06 NOTE — Assessment & Plan Note (Signed)
Summary: bs elevated/bmw   Vital Signs:  Patient profile:   65 year old female Height:      61 inches Weight:      215 pounds BMI:     40.77 Temp:     98.2 degrees F oral Pulse rate:   76 / minute Resp:     14 per minute BP sitting:   140 / 80  (left arm)  Vitals Entered By: Willy Eddy, LPN (April 24, 2009 10:47 AM) CC: C/O ELEVATED BLOOD SUGARS-IN AM 227 AND IN PM 360non complilent with diet due to death in family    Primary Care Provider:  Stacie Glaze MD  CC:  C/O ELEVATED BLOOD SUGARS-IN AM 227 AND IN PM 360non complilent with diet due to death in family .  History of Present Illness: Has noted increased CBG's in the 300 range with a low around 200. no hypogylcemia noted she is lethargic and feels like she needed to sleep all the time. She has not had an increase in he headache, chest pain or SOB The loss of the grandson has been the issue .Marland Kitchen... not motivated to do anything  Preventive Screening-Counseling & Management  Alcohol-Tobacco     Smoking Status: never     Passive Smoke Exposure: no  Problems Prior to Update: 1)  Hypokalemia  (ICD-276.8) 2)  Acute Bronchitis  (ICD-466.0) 3)  Back Pain, Lumbar, With Radiculopathy  (ICD-724.4) 4)  Lbbb  (ICD-426.3) 5)  Hypercholesterolemia Iia  (ICD-272.0) 6)  Headache  (ICD-784.0) 7)  Knee Pain, Right  (ICD-719.46) 8)  Costochondritis, Acute  (ICD-733.6) 9)  Chronic Lymphadenitis  (ICD-289.1) 10)  Urinary Frequency  (ICD-788.41) 11)  Depression  (ICD-311) 12)  Osteoarthritis  (ICD-715.90) 13)  Bursitis, Acromioclavicular, Left  (ICD-726.10) 14)  Hyperlipidemia Nec/nos  (ICD-272.4) 15)  Gerd  (ICD-530.81) 16)  Anticoagulation Rx  (ICD-V58.61) 17)  Atrial Fibrillation  (ICD-427.31) 18)  Diabetes Mellitus, Type II  (ICD-250.00) 19)  Coronary Artery Disease  (ICD-414.00) 20)  Family History Diabetes 1st Degree Relative  (ICD-V18.0) 21)  Hypertension  (ICD-401.9)  Medications Prior to Update: 1)  Toprol Xl  100 Mg Xr24h-Tab (Metoprolol Succinate) .Marland Kitchen.. 1 Once Daily 2)  Xanax 0.5 Mg  Tabs (Alprazolam) .... Take 1 Tablet By Mouth Four Times A Day 3)  Hydrochlorothiazide 12.5 Mg  Tabs (Hydrochlorothiazide) .... Take 1 Tablet By Mouth Once A Day 4)  Spironolactone 25 Mg  Tabs (Spironolactone) .Marland Kitchen.. 1 Once Daily 5)  Hydrocodone-Acetaminophen 10-325 Mg Tabs (Hydrocodone-Acetaminophen) .... 1/2 To 1 By Mouth Q 6 Hours As Needed 6)  Micardis 20 Mg Tabs (Telmisartan) .Marland Kitchen.. 1 Once Daily 7)  Prevacid 30 Mg  Cpdr (Lansoprazole) .... Take 1 Tablet By Mouth Two Times A Day 8)  Amaryl 4 Mg  Tabs (Glimepiride) .... One P.o. B.i.d. 9)  Lanoxin 0.25 Mg  Tabs (Digoxin) .Marland Kitchen.. 1 Once Daily 10)  Byetta 10 Mcg Pen 10 Mcg/0.34ml Soln (Exenatide) .... One Injection Twice Daily 11)  Bd Insulin Syringe Half-Unit 31g X 5/16" 0.3 Ml Misc (Insulin Syringe-Needle U-100) .... 7 Times A Day 12)  Nitrostat 0.4 Mg  Subl (Nitroglycerin) .... Use As Directed 13)  Levemir Flexpen 100 Unit/ml Soln (Insulin Detemir) .... Take 20 U  of Levemir Two Times A Day Unless The Fasting Sugarsr Drop Below 100  Then Cut To 15 Bid 14)  Onetouch Ultra Test  Strp (Glucose Blood) .... Use 6 Times A Day 15)  Novolog Penfill 100 Unit/ml Soln (Insulin Aspart) .Marland Kitchen.. 10  Units  Before Meals or As Directed 16)  Klor-Con M20 20 Meq Cr-Tabs (Potassium Chloride Crys Cr) .... Take One and One-Half Tablet Daily 17)  Lipitor 20 Mg Tabs (Atorvastatin Calcium) .Marland Kitchen.. 1 Once Daily 18)  Warfarin Sodium 6 Mg Tabs (Warfarin Sodium) .... Take As Directed By Coumadin Clinic. 19)  Digoxin 0.25 Mg Tabs (Digoxin) .... Take 1 By Mouth Once A Day 20)  Gabapentin 300 Mg Caps (Gabapentin) .Marland Kitchen.. 1 Three Times A Day  Current Medications (verified): 1)  Toprol Xl 100 Mg Xr24h-Tab (Metoprolol Succinate) .Marland Kitchen.. 1 Once Daily 2)  Xanax 0.5 Mg  Tabs (Alprazolam) .... Take 1 Tablet By Mouth Four Times A Day 3)  Hydrochlorothiazide 12.5 Mg  Tabs (Hydrochlorothiazide) .... Take 1 Tablet By Mouth  Once A Day 4)  Spironolactone 25 Mg  Tabs (Spironolactone) .Marland Kitchen.. 1 Once Daily 5)  Hydrocodone-Acetaminophen 10-325 Mg Tabs (Hydrocodone-Acetaminophen) .... 1/2 To 1 By Mouth Q 6 Hours As Needed 6)  Micardis 20 Mg Tabs (Telmisartan) .Marland Kitchen.. 1 Once Daily 7)  Prevacid 30 Mg  Cpdr (Lansoprazole) .... Take 1 Tablet By Mouth Two Times A Day 8)  Amaryl 4 Mg  Tabs (Glimepiride) .... One P.o. B.i.d. 9)  Lanoxin 0.25 Mg  Tabs (Digoxin) .Marland Kitchen.. 1 Once Daily 10)  Byetta 10 Mcg Pen 10 Mcg/0.18ml Soln (Exenatide) .... One Injection Twice Daily 11)  Bd Insulin Syringe Half-Unit 31g X 5/16" 0.3 Ml Misc (Insulin Syringe-Needle U-100) .... 7 Times A Day 12)  Nitrostat 0.4 Mg  Subl (Nitroglycerin) .... Use As Directed 13)  Levemir Flexpen 100 Unit/ml Soln (Insulin Detemir) .... Take 20 U  of Levemir Two Times A Day Unless The Fasting Sugarsr Drop Below 100  Then Cut To 15 Bid 14)  Onetouch Ultra Test  Strp (Glucose Blood) .... Use 6 Times A Day 15)  Novolog Penfill 100 Unit/ml Soln (Insulin Aspart) .Marland Kitchen.. 10 Units  Before Meals or As Directed 16)  Klor-Con M20 20 Meq Cr-Tabs (Potassium Chloride Crys Cr) .... Take One and One-Half Tablet Daily 17)  Lipitor 20 Mg Tabs (Atorvastatin Calcium) .Marland Kitchen.. 1 Once Daily 18)  Warfarin Sodium 6 Mg Tabs (Warfarin Sodium) .... Take As Directed By Coumadin Clinic. 19)  Digoxin 0.25 Mg Tabs (Digoxin) .... Take 1 By Mouth Once A Day 20)  Gabapentin 300 Mg Caps (Gabapentin) .Marland Kitchen.. 1 Three Times A Day  Allergies (verified): 1)  ! Sulfa 2)  ! Morphine 3)  ! Talwin 4)  ! Codeine 5)  ! Vibramycin  Past History:  Family History: Last updated: 03/19/2008 Family History of Stroke F 1st degree relative <60 Fam hx OA Family History Diabetes 1st degree relative Family History Hypertension Father:mi age 42 died Family History of Renal Disease:   Social History: Last updated: 09/13/2006 Married Retired Never Smoked  Risk Factors: Exercise: no (01/29/2007)  Risk Factors: Smoking  Status: never (04/24/2009) Passive Smoke Exposure: no (04/24/2009)  Past medical, surgical, family and social histories (including risk factors) reviewed, and no changes noted (except as noted below).  Past Medical History: Reviewed history from 01/14/2009 and no changes required. Palpitation Coronary artery disease, sees Dr. Riley Kill Diabetes mellitus, type II Hypertension atrial fibrilation GERD Depression  Past Surgical History: Reviewed history from 10/03/2006 and no changes required. angioplasty-06/2001 Lumbar laminectomy Coronary artery bypass graft LAD feb 27,2004 Tonsillectomy Cholecystectomy Hysterectomy  Family History: Reviewed history from 03/19/2008 and no changes required. Family History of Stroke F 1st degree relative <60 Fam hx OA Family History Diabetes 1st degree relative Family History  Hypertension Father:mi age 18 died Family History of Renal Disease:   Social History: Reviewed history from 09/13/2006 and no changes required. Married Retired Never Smoked  Review of Systems  The patient denies anorexia, fever, weight loss, weight gain, vision loss, decreased hearing, hoarseness, chest pain, syncope, dyspnea on exertion, peripheral edema, prolonged cough, headaches, hemoptysis, abdominal pain, melena, hematochezia, severe indigestion/heartburn, hematuria, incontinence, genital sores, muscle weakness, suspicious skin lesions, transient blindness, difficulty walking, depression, unusual weight change, abnormal bleeding, enlarged lymph nodes, angioedema, and breast masses.    Physical Exam  General:  Well-developed,well-nourished,in no acute distress; alert,appropriate and cooperative throughout examination Head:  Normocephalic and atraumatic without obvious abnormalities. No apparent alopecia or balding. Eyes:  No corneal or conjunctival inflammation noted. EOMI. Perrla. Funduscopic exam benign, without hemorrhages, exudates or papilledema. Vision grossly  normal. Nose:  External nasal examination shows no deformity or inflammation. Nasal mucosa are pink and moist without lesions or exudates. Mouth:  Oral mucosa and oropharynx without lesions or exudates.  Teeth in good repair. Neck:  No deformities, masses, or tenderness noted. Lungs:  soft rhonchi, no rales Heart:  Normal.  Paradoxical splitting of the second heart sounds.normal rate.     Impression & Recommendations:  Problem # 1:  DEPRESSION (ICD-311) increased grief reaction and sadness Her updated medication list for this problem includes:    Alprazolam 1 Mg Tabs (Alprazolam) ..... One by mouth three times a day as needed for grief  Discussed treatment options, including trial of antidpressant medication. Will refer to behavioral health. Follow-up call in in 24-48 hours and recheck in 2 weeks, sooner as needed. Patient agrees to call if any worsening of symptoms or thoughts of doing harm arise. Verified that the patient has no suicidal ideation at this time.   Problem # 2:  DIABETES MELLITUS, TYPE II (ICD-250.00) worsening of controll Her updated medication list for this problem includes:    Micardis 20 Mg Tabs (Telmisartan) .Marland Kitchen... 1 once daily    Amaryl 4 Mg Tabs (Glimepiride) ..... One p.o. b.i.d.    Byetta 10 Mcg Pen 10 Mcg/0.46ml Soln (Exenatide) ..... One injection twice daily    Levemir Flexpen 100 Unit/ml Soln (Insulin detemir) .Marland Kitchen... Take 20 u  of levemir two times a day unless the fasting sugarsr drop below 100  then cut to 15 bid    Novolog Penfill 100 Unit/ml Soln (Insulin aspart) .Marland KitchenMarland KitchenMarland KitchenMarland Kitchen 10 units  before meals or as directed  Labs Reviewed: Creat: 0.8 (02/02/2009)    Reviewed HgBA1c results: 7.6 (02/02/2009)  8.1 (11/28/2008)  Problem # 3:  ATRIAL FIBRILLATION (ICD-427.31)  Her updated medication list for this problem includes:    Toprol Xl 100 Mg Xr24h-tab (Metoprolol succinate) .Marland Kitchen... 1 once daily    Lanoxin 0.25 Mg Tabs (Digoxin) .Marland Kitchen... 1 once daily    Warfarin Sodium  6 Mg Tabs (Warfarin sodium) .Marland Kitchen... Take as directed by coumadin clinic.    Digoxin 0.25 Mg Tabs (Digoxin) .Marland Kitchen... Take 1 by mouth once a day  Reviewed the following: PT: 24.9 (09/25/2008)   INR: 4.2 (09/25/2008) Coumadin Dose (weekly): 27 mg (04/10/2009) Prior Coumadin Dose (weekly): 27 mg (04/10/2009) Next Protime: 04/28/2009 (dated on 04/10/2009)  Complete Medication List: 1)  Toprol Xl 100 Mg Xr24h-tab (Metoprolol succinate) .Marland Kitchen.. 1 once daily 2)  Alprazolam 1 Mg Tabs (Alprazolam) .... One by mouth three times a day as needed for grief 3)  Hydrochlorothiazide 12.5 Mg Tabs (Hydrochlorothiazide) .... Take 1 tablet by mouth once a day 4)  Spironolactone 25 Mg  Tabs (Spironolactone) .Marland Kitchen.. 1 once daily 5)  Hydrocodone-acetaminophen 10-325 Mg Tabs (Hydrocodone-acetaminophen) .... 1/2 to 1 by mouth q 6 hours as needed 6)  Micardis 20 Mg Tabs (Telmisartan) .Marland Kitchen.. 1 once daily 7)  Prevacid 30 Mg Cpdr (Lansoprazole) .... Take 1 tablet by mouth two times a day 8)  Amaryl 4 Mg Tabs (Glimepiride) .... One p.o. b.i.d. 9)  Lanoxin 0.25 Mg Tabs (Digoxin) .Marland Kitchen.. 1 once daily 10)  Byetta 10 Mcg Pen 10 Mcg/0.102ml Soln (Exenatide) .... One injection twice daily 11)  Bd Insulin Syringe Half-unit 31g X 5/16" 0.3 Ml Misc (Insulin syringe-needle u-100) .... 7 times a day 12)  Nitrostat 0.4 Mg Subl (Nitroglycerin) .... Use as directed 13)  Levemir Flexpen 100 Unit/ml Soln (Insulin detemir) .... Take 20 u  of levemir two times a day unless the fasting sugarsr drop below 100  then cut to 15 bid 14)  Onetouch Ultra Test Strp (Glucose blood) .... Use 6 times a day 15)  Novolog Penfill 100 Unit/ml Soln (Insulin aspart) .Marland Kitchen.. 10 units  before meals or as directed 16)  Klor-con M20 20 Meq Cr-tabs (Potassium chloride crys cr) .... Take one and one-half tablet daily 17)  Lipitor 20 Mg Tabs (Atorvastatin calcium) .Marland Kitchen.. 1 once daily 18)  Warfarin Sodium 6 Mg Tabs (Warfarin sodium) .... Take as directed by coumadin clinic. 19)   Digoxin 0.25 Mg Tabs (Digoxin) .... Take 1 by mouth once a day 20)  Gabapentin 300 Mg Caps (Gabapentin) .Marland Kitchen.. 1 three times a day  Patient Instructions: 1)  Please schedule a follow-up appointment in 1 month. 2)  for a short short period we will increase the xanax to 1 mg three times a day 3)    increase the levimir to 25 units two times a day  4)  resume the 20 u if the sugars drop back to 150 Prescriptions: ALPRAZOLAM 1 MG TABS (ALPRAZOLAM) one by mouth three times a day as needed for grief  #90 x 0   Entered and Authorized by:   Stacie Glaze MD   Signed by:   Stacie Glaze MD on 04/24/2009   Method used:   Print then Give to Patient   RxID:   [id number]

## 2010-04-06 NOTE — Progress Notes (Signed)
Summary: Novolog dosing?  Phone Note Call from Patient Call back at Home Phone [phone]   Caller: Patient Summary of Call: pt called stating that she was given samples of Novolog 70/30 by [name] because she is in the "doughnut hole" and cannot afford insulin.  pt is unsure if this was in error but she was not given dosing instructions. Pt called [name] office to inquire but was told that she was out of the office and to call Dr Everardo All.  Per EMR pt is on Novolog 100u/ml three times a day. I spoke with Pam RN at [name] office to find out more information about this. Pam call back stating that she had a sample of Novolog 100 and she would advise pt to pick this up and forward previous note to [name] when he returns for review and dosing instructions. Initial call taken by: Margaret Pyle, CMA,  November 13, 2009 2:14 PM  Follow-up for Phone Call        if pt wants cheaper insulin, i am happy to change novolog to reg, and levemir to nph.   Follow-up by: Minus Breeding MD,  November 13, 2009 2:38 PM  Additional Follow-up for Phone Call Additional follow up Details #1::        Pt states she will call back once she has been contacted by [name]' office. I will call pt Wednesday to confirm Additional Follow-up by: Margaret Pyle, CMA,  November 13, 2009 3:31 PM

## 2010-04-06 NOTE — Progress Notes (Signed)
Summary: lab order  Phone Note Call from Patient Call back at Home Phone [phone] Call back at Work Phone [phone]   Caller: Patient Reason for Call: Talk to Nurse, Lab or Test Results Details for Reason: Does pt need a new order for lab work. pt has a lab order for 5/24 Initial call taken by: Lorne Skeens,  August 19, 2009 9:05 AM  Follow-up for Phone Call        I spoke with the pt and rescheduled her labwork.  The pt also said she is having discomfort in her back and throughout her chest area.  The pt said she notices this discomfort when she moves or coughs.  I made the pt aware that this is most likey muscular. The pt has also had costochondritis in the past and this could be causing some of her discomfort.   The pt will call the office if she has any change in her symptoms.  Follow-up by: Julieta Gutting, RN, BSN,  August 19, 2009 9:41 AM

## 2010-04-06 NOTE — Progress Notes (Signed)
  Phone Note Call from Patient Call back at Home Phone [phone]   Caller: Patient Call For: Stacie Glaze MD Reason for Call: Refill Medication, Talk to Nurse Summary of Call: Pt is calling to let Dr. Lovell Sheehan know her left arm is still swollen and painful.  Is asking if there is anything else to do about this?  Her entire left side feels swollen and tight.  Initial call taken by: Lynann Beaver CMA,  November 17, 2009 2:32 PM  Follow-up for Phone Call        per [name]- to orthopdeist- pt request dr Thurston Hole and will call and make own appointment Follow-up by: Willy Eddy, LPN,  November 17, 2009 5:01 PM

## 2010-04-06 NOTE — Assessment & Plan Note (Signed)
Summary: 2 month rov/njr   Vital Signs:  Patient profile:   65 year old female Height:      61 inches Weight:      216 pounds BMI:     40.96 Temp:     98.1 degrees F oral Resp:     14 per minute BP sitting:   114 / 74  (left arm)  Vitals Entered By: Willy Eddy, LPN (September 14, 2009 12:03 PM) CC: roa, Hypertension Management   Primary Care Provider:  Stacie Glaze MD  CC:  roa and Hypertension Management.  History of Present Illness: left sided upper back and left shoulder pain as well as a sensation of pain in the neck and swelling high surgical risks DM complicates the picture she is fearfull of steroid due to elevated blood sugars she has a stable HR with no evidence of CHF  Hypertension History:      She denies headache, chest pain, palpitations, dyspnea with exertion, orthopnea, PND, peripheral edema, visual symptoms, neurologic problems, syncope, and side effects from treatment.        Positive major cardiovascular risk factors include female age 2 years old or older, diabetes, hyperlipidemia, hypertension, and family history for ischemic heart disease (males less than 28 years old).  Negative major cardiovascular risk factors include non-tobacco-user status.        Positive history for target organ damage include ASHD (either angina/prior MI/prior CABG), cardiac end organ damage (either CHF or LVH), prior stroke (or TIA), and peripheral vascular disease.  Further assessment for target organ damage reveals no history of renal insufficiency or hypertensive retinopathy.     Preventive Screening-Counseling & Management  Alcohol-Tobacco     Smoking Status: never     Passive Smoke Exposure: no  Problems Prior to Update: 1)  Pulmonary Congestion  (ICD-786.9) 2)  Diab w/o Comp Type I [juv] Not Stated Uncntrl  (ICD-250.01) 3)  Rib Pain, Left Sided  (ICD-786.50) 4)  Coronary Artery Disease  (ICD-414.00) 5)  Atrial Fibrillation  (ICD-427.31) 6)  Anticoagulation Rx   (ICD-V58.61) 7)  Hypertension  (ICD-401.9) 8)  Hyperlipidemia Nec/nos  (ICD-272.4) 9)  Gerd  (ICD-530.81) 10)  Hypokalemia  (ICD-276.8) 11)  Diabetes Mellitus, Type II  (ICD-250.00) 12)  Depression  (ICD-311) 13)  Grief Reaction, Acute  (ICD-309.0) 14)  Acute Bronchitis  (ICD-466.0) 15)  Back Pain, Lumbar, With Radiculopathy  (ICD-724.4) 16)  Lbbb  (ICD-426.3) 17)  Headache  (ICD-784.0) 18)  Knee Pain, Right  (ICD-719.46) 19)  Costochondritis, Acute  (ICD-733.6) 20)  Chronic Lymphadenitis  (ICD-289.1) 21)  Urinary Frequency  (ICD-788.41) 22)  Osteoarthritis  (ICD-715.90) 23)  Bursitis, Acromioclavicular, Left  (ICD-726.10) 24)  Family History Diabetes 1st Degree Relative  (ICD-V18.0)  Current Problems (verified): 1)  Diab w/o Comp Type I [juv] Not Stated Uncntrl  (ICD-250.01) 2)  Rib Pain, Left Sided  (ICD-786.50) 3)  Coronary Artery Disease  (ICD-414.00) 4)  Atrial Fibrillation  (ICD-427.31) 5)  Anticoagulation Rx  (ICD-V58.61) 6)  Hypertension  (ICD-401.9) 7)  Hyperlipidemia Nec/nos  (ICD-272.4) 8)  Gerd  (ICD-530.81) 9)  Hypokalemia  (ICD-276.8) 10)  Diabetes Mellitus, Type II  (ICD-250.00) 11)  Depression  (ICD-311) 12)  Grief Reaction, Acute  (ICD-309.0) 13)  Acute Bronchitis  (ICD-466.0) 14)  Back Pain, Lumbar, With Radiculopathy  (ICD-724.4) 15)  Lbbb  (ICD-426.3) 16)  Headache  (ICD-784.0) 17)  Knee Pain, Right  (ICD-719.46) 18)  Costochondritis, Acute  (ICD-733.6) 19)  Chronic Lymphadenitis  (ICD-289.1) 20)  Urinary Frequency  (ICD-788.41) 21)  Osteoarthritis  (ICD-715.90) 22)  Bursitis, Acromioclavicular, Left  (ICD-726.10) 23)  Family History Diabetes 1st Degree Relative  (ICD-V18.0)  Medications Prior to Update: 1)  Toprol Xl 100 Mg Xr24h-Tab (Metoprolol Succinate) .Marland Kitchen.. 1 Once Daily 2)  Alprazolam 1 Mg Tabs (Alprazolam) .... One By Mouth Three Times A Day As Needed For Grief 3)  Hydrochlorothiazide 12.5 Mg  Tabs (Hydrochlorothiazide) .... Take 1 Tablet By  Mouth Once A Day 4)  Spironolactone 25 Mg  Tabs (Spironolactone) .Marland Kitchen.. 1 Once Daily 5)  Hydrocodone-Acetaminophen 10-325 Mg Tabs (Hydrocodone-Acetaminophen) .... 1/2 To 1 By Mouth Q 6 Hours As Needed 6)  Micardis 40 Mg Tabs (Telmisartan) .Marland Kitchen.. 1 Once Daily or As Directed 7)  Prevacid 30 Mg  Cpdr (Lansoprazole) .... Take 1 Tablet By Mouth Two Times A Day 8)  Amaryl 4 Mg  Tabs (Glimepiride) .... One P.o. B.i.d. 9)  Byetta 10 Mcg Pen 10 Mcg/0.64ml Soln (Exenatide) .... One Injection Twice Daily 10)  Bd Insulin Syringe Half-Unit 31g X 5/16" 0.3 Ml Misc (Insulin Syringe-Needle U-100) .... 7 Times A Day 11)  Nitrostat 0.4 Mg  Subl (Nitroglycerin) .... Use As Directed 12)  Levemir Flexpen 100 Unit/ml Soln (Insulin Detemir) .... Take 20 U  of Levemir Two Times A Day Unless The Fasting Sugarsr Drop Below 100  Then Cut To 15 Bid 13)  Onetouch Ultra Test  Strp (Glucose Blood) .... Use 6 Times A Day 14)  Novolog Penfill 100 Unit/ml Soln (Insulin Aspart) .Marland Kitchen.. 10 Units  Before Meals or As Directed 15)  Klor-Con M20 20 Meq Cr-Tabs (Potassium Chloride Crys Cr) .... Take One and One-Half Tablet Daily 16)  Lipitor 20 Mg Tabs (Atorvastatin Calcium) .Marland Kitchen.. 1 Once Daily 17)  Warfarin Sodium 6 Mg Tabs (Warfarin Sodium) .... Take As Directed By [facility] Clinic. 18)  Digoxin 0.25 Mg Tabs (Digoxin) .... Take 1 By Mouth Once A Day 19)  Gabapentin 300 Mg Caps (Gabapentin) .Marland Kitchen.. 1 Three Times A Day 20)  Betamethasone Dipropionate Aug 0.05 % Crea (Betamethasone Dipropionate Aug) .... Apply To Rash Two Times A Day  Current Medications (verified): 1)  Toprol Xl 100 Mg Xr24h-Tab (Metoprolol Succinate) .Marland Kitchen.. 1 Once Daily 2)  Alprazolam 1 Mg Tabs (Alprazolam) .... One By Mouth Three Times A Day As Needed For Grief 3)  Hydrochlorothiazide 12.5 Mg  Tabs (Hydrochlorothiazide) .... Take 1 Tablet By Mouth Once A Day 4)  Spironolactone 25 Mg  Tabs (Spironolactone) .Marland Kitchen.. 1 Once Daily 5)  Hydrocodone-Acetaminophen 10-325 Mg Tabs  (Hydrocodone-Acetaminophen) .... 1/2 To 1 By Mouth Q 6 Hours As Needed 6)  Micardis 40 Mg Tabs (Telmisartan) .Marland Kitchen.. 1 Once Daily or As Directed 7)  Prevacid 30 Mg  Cpdr (Lansoprazole) .... Take 1 Tablet By Mouth Two Times A Day 8)  Amaryl 4 Mg  Tabs (Glimepiride) .... One P.o. B.i.d. 9)  Byetta 10 Mcg Pen 10 Mcg/0.72ml Soln (Exenatide) .... One Injection Twice Daily 10)  Bd Insulin Syringe Half-Unit 31g X 5/16" 0.3 Ml Misc (Insulin Syringe-Needle U-100) .... 7 Times A Day 11)  Nitrostat 0.4 Mg  Subl (Nitroglycerin) .... Use As Directed 12)  Levemir Flexpen 100 Unit/ml Soln (Insulin Detemir) .... Take 30 U  of Levemir Two Times A Day Unless The Fasting Sugarsr Drop Below 100  Then Cut To 15 Bid 13)  Onetouch Ultra Test  Strp (Glucose Blood) .... Use 6 Times A Day 14)  Novolog Penfill 100 Unit/ml Soln (Insulin Aspart) .Marland Kitchen.. 10 Units  Before Meals or  As Directed 15)  Klor-Con M20 20 Meq Cr-Tabs (Potassium Chloride Crys Cr) .... Take One and One-Half Tablet Daily 16)  Lipitor 20 Mg Tabs (Atorvastatin Calcium) .Marland Kitchen.. 1 Once Daily 17)  Warfarin Sodium 6 Mg Tabs (Warfarin Sodium) .... Take As Directed By [facility]mad[facility] Clinic. 18)  Digoxin 0.25 Mg Tabs (Digoxin) .... Take 1 By Mouth Once A Day 19)  Gabapentin 300 Mg Caps (Gabapentin) .Marland Kitchen.. 1 Three Times A Day 20)  Betamethasone Dipropionate Aug 0.05 % Crea (Betamethasone Dipropionate Aug) .... Apply To Rash Two Times A Day 21)  Amrix 15 Mg Xr24h-Cap (Cyclobenzaprine Hcl) .... One By Mouth Daily Prn  Allergies (verified): 1)  ! Sulfa 2)  ! Morphine 3)  ! Talwin 4)  ! Codeine 5)  ! Vibramycin  Past History:  Family History: Last updated: 03/19/2008 Family History of Stroke F 1st degree relative <60 Fam hx OA Family History Diabetes 1st degree relative Family History Hypertension Father:mi age 35 died Family History of Renal Disease:   Social History: Last updated: 07/15/2009 Married Retired Never Smoked  Risk Factors: Exercise: no  (01/29/2007)  Risk Factors: Smoking Status: never (09/14/2009) Passive Smoke Exposure: no (09/14/2009)  Past medical, surgical, family and social histories (including risk factors) reviewed, and no changes noted (except as noted below).  Past Medical History: Reviewed history from 07/15/2009 and no changes required. Current Problems:  CORONARY ARTERY DISEASE (ICD-414.00) ATRIAL FIBRILLATION (ICD-427.31) ANTICOAGULATION RX (ICD-V58.61) HYPERTENSION (ICD-401.9) HYPERLIPIDEMIA NEC/NOS (ICD-272.4) GERD (ICD-530.81) HYPOKALEMIA (ICD-276.8) DIABETES MELLITUS, TYPE II (ICD-250.00) DEPRESSION (ICD-311) GRIEF REACTION, ACUTE (ICD-309.0) ACUTE BRONCHITIS (ICD-466.0) BACK PAIN, LUMBAR, WITH RADICULOPATHY (ICD-724.4) LBBB (ICD-426.3) HEADACHE (ICD-784.0) KNEE PAIN, RIGHT (ICD-719.46) COSTOCHONDRITIS, ACUTE (ICD-733.6) CHRONIC LYMPHADENITIS (ICD-289.1) URINARY FREQUENCY (ICD-788.41) OSTEOARTHRITIS (ICD-715.90) BURSITIS, ACROMIOCLAVICULAR, LEFT (ICD-726.10) FAMILY HISTORY DIABETES 1ST DEGREE RELATIVE (ICD-V18.0)  Past Surgical History: Reviewed history from 07/15/2009 and no changes required. angioplasty-06/2001 Lumbar laminectomy Coronary artery bypass graft LAD feb 27,2004 Tonsillectomy Cholecystectomy Hysterectomy  Family History: Reviewed history from 03/19/2008 and no changes required. Family History of Stroke F 1st degree relative <60 Fam hx OA Family History Diabetes 1st degree relative Family History Hypertension Father:mi age 86 died Family History of Renal Disease:   Social History: Reviewed history from 07/15/2009 and no changes required. Married Retired Never Smoked  Review of Systems  The patient denies anorexia, fever, weight loss, weight gain, vision loss, decreased hearing, hoarseness, chest pain, syncope, dyspnea on exertion, peripheral edema, prolonged cough, headaches, hemoptysis, abdominal pain, melena, hematochezia, severe indigestion/heartburn,  hematuria, incontinence, genital sores, muscle weakness, suspicious skin lesions, transient blindness, difficulty walking, depression, unusual weight change, abnormal bleeding, enlarged lymph nodes, angioedema, and breast masses.    Physical Exam  General:  well-developed and well-nourished.   Head:  Normocephalic and atraumatic without obvious abnormalities. No apparent alopecia or balding. Eyes:  pupils equal and pupils reactive to light.   Ears:  R ear normal and L ear normal.   Nose:  no external deformity and no nasal discharge.   Neck:  No deformities, masses, or tenderness noted. Lungs:  normal respiratory effort and no wheezes.   Heart:  normal rate and regular rhythm.   Abdomen:  Bowel sounds positive,abdomen soft and non-tender without masses, organomegaly or hernias noted. Msk:  lumbar lordosis, SI joint tenderness, and trigger point tenderness.   neck tight and tender  Diabetes Management Exam:    Foot Exam (with socks and/or shoes not present):       Sensory-Monofilament:          Left  foot: diminished          Right foot: diminished       Inspection:          Left foot: normal          Right foot: normal   Impression & Recommendations:  Problem # 1:  RIB PAIN, LEFT SIDED (ICD-786.50) did not get the xray she feels left chest and left shoulder swelling and pain  Problem # 2:  DIAB W/O COMP TYPE I [JUV] NOT STATED UNCNTRL (ICD-250.01)  meal planning and avoiding sweets Her updated medication list for this problem includes:    Micardis 40 Mg Tabs (Telmisartan) .Marland Kitchen... 1 once daily or as directed    Amaryl 4 Mg Tabs (Glimepiride) ..... One p.o. b.i.d.    Byetta 10 Mcg Pen 10 Mcg/0.43ml Soln (Exenatide) ..... One injection twice daily    Levemir Flexpen 100 Unit/ml Soln (Insulin detemir) .Marland Kitchen... Take 30 u  of levemir two times a day unless the fasting sugarsr drop below 100  then cut to 15 bid    Novolog Penfill 100 Unit/ml Soln (Insulin aspart) .Marland KitchenMarland KitchenMarland KitchenMarland Kitchen 10 units  before  meals or as directed  Labs Reviewed: Creat: 0.8 (08/21/2009)    Reviewed HgBA1c results: 7.6 (02/02/2009)  8.1 (11/28/2008)  Problem # 3:  HYPERTENSION (ICD-401.9) Assessment: Unchanged  Her updated medication list for this problem includes:    Toprol Xl 100 Mg Xr24h-tab (Metoprolol succinate) .Marland Kitchen... 1 once daily    Hydrochlorothiazide 12.5 Mg Tabs (Hydrochlorothiazide) .Marland Kitchen... Take 1 tablet by mouth once a day    Spironolactone 25 Mg Tabs (Spironolactone) .Marland Kitchen... 1 once daily    Micardis 40 Mg Tabs (Telmisartan) .Marland Kitchen... 1 once daily or as directed  BP today: 114/74 Prior BP: 120/84 (07/16/2009)  Prior 10 Yr Risk Heart Disease: N/A (11/22/2006)  Labs Reviewed: K+: 4.2 (08/21/2009) Creat: : 0.8 (08/21/2009)   Chol: 121 (08/21/2009)   HDL: 32.80 (08/21/2009)   LDL: 63 (08/21/2009)   TG: 124.0 (08/21/2009)  Problem # 4:  GRIEF REACTION, ACUTE (ICD-309.0)  Complete Medication List: 1)  Toprol Xl 100 Mg Xr24h-tab (Metoprolol succinate) .Marland Kitchen.. 1 once daily 2)  Alprazolam 1 Mg Tabs (Alprazolam) .... One by mouth three times a day as needed for grief 3)  Hydrochlorothiazide 12.5 Mg Tabs (Hydrochlorothiazide) .... Take 1 tablet by mouth once a day 4)  Spironolactone 25 Mg Tabs (Spironolactone) .Marland Kitchen.. 1 once daily 5)  Hydrocodone-acetaminophen 10-325 Mg Tabs (Hydrocodone-acetaminophen) .... 1/2 to 1 by mouth q 6 hours as needed 6)  Micardis 40 Mg Tabs (Telmisartan) .Marland Kitchen.. 1 once daily or as directed 7)  Prevacid 30 Mg Cpdr (Lansoprazole) .... Take 1 tablet by mouth two times a day 8)  Amaryl 4 Mg Tabs (Glimepiride) .... One p.o. b.i.d. 9)  Byetta 10 Mcg Pen 10 Mcg/0.58ml Soln (Exenatide) .... One injection twice daily 10)  Bd Insulin Syringe Half-unit 31g X 5/16" 0.3 Ml Misc (Insulin syringe-needle u-100) .... 7 times a day 11)  Nitrostat 0.4 Mg Subl (Nitroglycerin) .... Use as directed 12)  Levemir Flexpen 100 Unit/ml Soln (Insulin detemir) .... Take 30 u  of levemir two times a day unless the  fasting sugarsr drop below 100  then cut to 15 bid 13)  Onetouch Ultra Test Strp (Glucose blood) .... Use 6 times a day 14)  Novolog Penfill 100 Unit/ml Soln (Insulin aspart) .Marland Kitchen.. 10 units  before meals or as directed 15)  Klor-con M20 20 Meq Cr-tabs (Potassium chloride crys cr) .... Take one and  one-half tablet daily 16)  Lipitor 20 Mg Tabs (Atorvastatin calcium) .Marland Kitchen.. 1 once daily 17)  Warfarin Sodium 6 Mg Tabs (Warfarin sodium) .... Take as directed by coumadin clinic. 18)  Digoxin 0.25 Mg Tabs (Digoxin) .... Take 1 by mouth once a day 19)  Gabapentin 300 Mg Caps (Gabapentin) .Marland Kitchen.. 1 three times a day 20)  Betamethasone Dipropionate Aug 0.05 % Crea (Betamethasone dipropionate aug) .... Apply to rash two times a day 21)  Amrix 15 Mg Xr24h-cap (Cyclobenzaprine hcl) .... One by mouth daily prn  Hypertension Assessment/Plan:      The patient's hypertensive risk group is category C: Target organ damage and/or diabetes.  Today's blood pressure is 114/74.  Her blood pressure goal is < 130/80.  Patient Instructions: 1)  Please schedule a follow-up appointment in 1 months. Prescriptions: HYDROCODONE-ACETAMINOPHEN 10-325 MG TABS (HYDROCODONE-ACETAMINOPHEN) 1/2 to 1 by mouth q 6 hours as needed  #120 x 1   Entered by:   Willy Eddy, LPN   Authorized by:   Stacie Glaze MD   Signed by:   Willy Eddy, LPN on 36/64/4034   Method used:   Print then Give to Patient   RxID:   [id number]

## 2010-04-06 NOTE — Progress Notes (Signed)
Summary: rib pain?????  Phone Note Call from Patient   Caller: Patient Call For: Stacie Glaze MD Summary of Call: Pt is having a severe pain left side under shoulder blade that radiates to flank that comes and goes.  Pt bent over, and had a pop in ribs 07/14/2009.  Pain started last night doing normal activities.  No fever. No UTI complaints.  No diarrhea or vomiting.  Sore under ribs. [phone] Initial call taken by: Lynann Beaver CMA,  August 11, 2009 5:03 PM  Follow-up for Phone Call        per [name] rib details  Pt notified, and will get xrays today. Lynann Beaver [facility]  August 12, 2009 7:57 AM  Follow-up by: Willy Eddy, LPN,  August 12, 4538 5:35 PM  New Problems: RIB PAIN, LEFT SIDED (ICD-786.50)   New Problems: RIB PAIN, LEFT SIDED (ICD-786.50)

## 2010-04-06 NOTE — Progress Notes (Signed)
Summary: pain,swelling arm & cough, cold, congestion  Phone Note Call from Patient Call back at Home Phone [phone]   Caller: vm Reason for Call: Acute Illness Summary of Call: Pain that we thought shoulder & got cortisone shot not better.  Really bad pain episode middle of back thoracic area, swelling of arm, also coughing up stuff & cold, think something lungs and nasal congestion.   Initial call taken by: [name], RN,  September 22, 2009 4:02 PM  Follow-up for Phone Call        cxr- will call keith at [address] in am and ask if he can do cxr Follow-up by: Willy Eddy, LPN,  September 22, 2009 5:23 PM  New Problems: PULMONARY CONGESTION (ICD-786.9)   New Problems: PULMONARY CONGESTION (ICD-786.9)

## 2010-04-06 NOTE — Assessment & Plan Note (Signed)
Summary: 1 MONTH ROV/NJR/PT RSC/CJR   Vital Signs:  Patient profile:   65 year old female Height:      61 inches Weight:      216 pounds BMI:     40.96 Temp:     98.2 degrees F oral Pulse rate:   76 / minute Resp:     14 per minute BP sitting:   130 / 80  (left arm)  Vitals Entered By: Willy Eddy, LPN (May 25, 2009 4:18 PM) CC: roa, Hypertension Management   Primary Care Provider:  Stacie Glaze MD  CC:  roa and Hypertension Management.  History of Present Illness: The pt noted that the palpitations did improve with limiting the caffiene. She admits that her diet is poor. She stays in bead  all the time and "eats too much" she asks" I'll get better won't I) Discussion of greif process Disscussion of the activity Dicussion of volunteering at the hospital  Hypertension History:      She denies headache, chest pain, palpitations, dyspnea with exertion, orthopnea, PND, peripheral edema, visual symptoms, neurologic problems, syncope, and side effects from treatment.        Positive major cardiovascular risk factors include female age 13 years old or older, diabetes, hyperlipidemia, hypertension, and family history for ischemic heart disease (males less than 61 years old).  Negative major cardiovascular risk factors include non-tobacco-user status.        Positive history for target organ damage include ASHD (either angina/prior MI/prior CABG), cardiac end organ damage (either CHF or LVH), prior stroke (or TIA), and peripheral vascular disease.  Further assessment for target organ damage reveals no history of renal insufficiency or hypertensive retinopathy.     Preventive Screening-Counseling & Management  Alcohol-Tobacco     Smoking Status: never     Passive Smoke Exposure: no  Problems Prior to Update: 1)  Grief Reaction, Acute  (ICD-309.0) 2)  Hypokalemia  (ICD-276.8) 3)  Acute Bronchitis  (ICD-466.0) 4)  Back Pain, Lumbar, With Radiculopathy  (ICD-724.4) 5)   Lbbb  (ICD-426.3) 6)  Hypercholesterolemia Iia  (ICD-272.0) 7)  Headache  (ICD-784.0) 8)  Knee Pain, Right  (ICD-719.46) 9)  Costochondritis, Acute  (ICD-733.6) 10)  Chronic Lymphadenitis  (ICD-289.1) 11)  Urinary Frequency  (ICD-788.41) 12)  Depression  (ICD-311) 13)  Osteoarthritis  (ICD-715.90) 14)  Bursitis, Acromioclavicular, Left  (ICD-726.10) 15)  Hyperlipidemia Nec/nos  (ICD-272.4) 16)  Gerd  (ICD-530.81) 17)  Anticoagulation Rx  (ICD-V58.61) 18)  Atrial Fibrillation  (ICD-427.31) 19)  Diabetes Mellitus, Type II  (ICD-250.00) 20)  Coronary Artery Disease  (ICD-414.00) 21)  Family History Diabetes 1st Degree Relative  (ICD-V18.0) 22)  Hypertension  (ICD-401.9)  Current Problems (verified): 1)  Grief Reaction, Acute  (ICD-309.0) 2)  Hypokalemia  (ICD-276.8) 3)  Acute Bronchitis  (ICD-466.0) 4)  Back Pain, Lumbar, With Radiculopathy  (ICD-724.4) 5)  Lbbb  (ICD-426.3) 6)  Hypercholesterolemia Iia  (ICD-272.0) 7)  Headache  (ICD-784.0) 8)  Knee Pain, Right  (ICD-719.46) 9)  Costochondritis, Acute  (ICD-733.6) 10)  Chronic Lymphadenitis  (ICD-289.1) 11)  Urinary Frequency  (ICD-788.41) 12)  Depression  (ICD-311) 13)  Osteoarthritis  (ICD-715.90) 14)  Bursitis, Acromioclavicular, Left  (ICD-726.10) 15)  Hyperlipidemia Nec/nos  (ICD-272.4) 16)  Gerd  (ICD-530.81) 17)  Anticoagulation Rx  (ICD-V58.61) 18)  Atrial Fibrillation  (ICD-427.31) 19)  Diabetes Mellitus, Type II  (ICD-250.00) 20)  Coronary Artery Disease  (ICD-414.00) 21)  Family History Diabetes 1st Degree Relative  (ICD-V18.0) 22)  Hypertension  (ICD-401.9)  Medications Prior to Update: 1)  Toprol Xl 100 Mg Xr24h-Tab (Metoprolol Succinate) .Marland Kitchen.. 1 Once Daily 2)  Alprazolam 1 Mg Tabs (Alprazolam) .... One By Mouth Three Times A Day As Needed For Grief 3)  Hydrochlorothiazide 12.5 Mg  Tabs (Hydrochlorothiazide) .... Take 1 Tablet By Mouth Once A Day 4)  Spironolactone 25 Mg  Tabs (Spironolactone) .Marland Kitchen.. 1 Once  Daily 5)  Hydrocodone-Acetaminophen 10-325 Mg Tabs (Hydrocodone-Acetaminophen) .... 1/2 To 1 By Mouth Q 6 Hours As Needed 6)  Micardis 20 Mg Tabs (Telmisartan) .Marland Kitchen.. 1 Once Daily 7)  Prevacid 30 Mg  Cpdr (Lansoprazole) .... Take 1 Tablet By Mouth Two Times A Day 8)  Amaryl 4 Mg  Tabs (Glimepiride) .... One P.o. B.i.d. 9)  Lanoxin 0.25 Mg  Tabs (Digoxin) .Marland Kitchen.. 1 Once Daily 10)  Byetta 10 Mcg Pen 10 Mcg/0.53ml Soln (Exenatide) .... One Injection Twice Daily 11)  Bd Insulin Syringe Half-Unit 31g X 5/16" 0.3 Ml Misc (Insulin Syringe-Needle U-100) .... 7 Times A Day 12)  Nitrostat 0.4 Mg  Subl (Nitroglycerin) .... Use As Directed 13)  Levemir Flexpen 100 Unit/ml Soln (Insulin Detemir) .... Take 20 U  of Levemir Two Times A Day Unless The Fasting Sugarsr Drop Below 100  Then Cut To 15 Bid 14)  Onetouch Ultra Test  Strp (Glucose Blood) .... Use 6 Times A Day 15)  Novolog Penfill 100 Unit/ml Soln (Insulin Aspart) .Marland Kitchen.. 10 Units  Before Meals or As Directed 16)  Klor-Con M20 20 Meq Cr-Tabs (Potassium Chloride Crys Cr) .... Take One and One-Half Tablet Daily 17)  Lipitor 20 Mg Tabs (Atorvastatin Calcium) .Marland Kitchen.. 1 Once Daily 18)  Warfarin Sodium 6 Mg Tabs (Warfarin Sodium) .... Take As Directed By Coumadin Clinic. 19)  Digoxin 0.25 Mg Tabs (Digoxin) .... Take 1 By Mouth Once A Day 20)  Gabapentin 300 Mg Caps (Gabapentin) .Marland Kitchen.. 1 Three Times A Day  Current Medications (verified): 1)  Toprol Xl 100 Mg Xr24h-Tab (Metoprolol Succinate) .Marland Kitchen.. 1 Once Daily 2)  Alprazolam 1 Mg Tabs (Alprazolam) .... One By Mouth Three Times A Day As Needed For Grief 3)  Hydrochlorothiazide 12.5 Mg  Tabs (Hydrochlorothiazide) .... Take 1 Tablet By Mouth Once A Day 4)  Spironolactone 25 Mg  Tabs (Spironolactone) .Marland Kitchen.. 1 Once Daily 5)  Hydrocodone-Acetaminophen 10-325 Mg Tabs (Hydrocodone-Acetaminophen) .... 1/2 To 1 By Mouth Q 6 Hours As Needed 6)  Micardis 20 Mg Tabs (Telmisartan) .Marland Kitchen.. 1 Once Daily 7)  Prevacid 30 Mg  Cpdr  (Lansoprazole) .... Take 1 Tablet By Mouth Two Times A Day 8)  Amaryl 4 Mg  Tabs (Glimepiride) .... One P.o. B.i.d. 9)  Lanoxin 0.25 Mg  Tabs (Digoxin) .Marland Kitchen.. 1 Once Daily 10)  Byetta 10 Mcg Pen 10 Mcg/0.70ml Soln (Exenatide) .... One Injection Twice Daily 11)  Bd Insulin Syringe Half-Unit 31g X 5/16" 0.3 Ml Misc (Insulin Syringe-Needle U-100) .... 7 Times A Day 12)  Nitrostat 0.4 Mg  Subl (Nitroglycerin) .... Use As Directed 13)  Levemir Flexpen 100 Unit/ml Soln (Insulin Detemir) .... Take 20 U  of Levemir Two Times A Day Unless The Fasting Sugarsr Drop Below 100  Then Cut To 15 Bid 14)  Onetouch Ultra Test  Strp (Glucose Blood) .... Use 6 Times A Day 15)  Novolog Penfill 100 Unit/ml Soln (Insulin Aspart) .Marland Kitchen.. 10 Units  Before Meals or As Directed 16)  Klor-Con M20 20 Meq Cr-Tabs (Potassium Chloride Crys Cr) .... Take One and One-Half Tablet Daily 17)  Lipitor 20  Mg Tabs (Atorvastatin Calcium) .Marland Kitchen.. 1 Once Daily 18)  Warfarin Sodium 6 Mg Tabs (Warfarin Sodium) .... Take As Directed By Coumadin Clinic. 19)  Digoxin 0.25 Mg Tabs (Digoxin) .... Take 1 By Mouth Once A Day 20)  Gabapentin 300 Mg Caps (Gabapentin) .Marland Kitchen.. 1 Three Times A Day 21)  Lexapro 10 Mg Tabs (Escitalopram Oxalate) .... One  By Mouth Daily 22)  Betamethasone Dipropionate Aug 0.05 % Crea (Betamethasone Dipropionate Aug) .... Apply To Rash Two Times A Day 23)  Fluconazole 150 Mg Tabs (Fluconazole) .... One By Mouth Now and Repeat in 5 Days  Allergies (verified): 1)  ! Sulfa 2)  ! Morphine 3)  ! Talwin 4)  ! Codeine 5)  ! Vibramycin  Past History:  Family History: Last updated: 03/19/2008 Family History of Stroke F 1st degree relative <60 Fam hx OA Family History Diabetes 1st degree relative Family History Hypertension Father:mi age 31 died Family History of Renal Disease:   Social History: Last updated: 09/13/2006 Married Retired Never Smoked  Risk Factors: Exercise: no (01/29/2007)  Risk Factors: Smoking  Status: never (05/25/2009) Passive Smoke Exposure: no (05/25/2009)  Past medical, surgical, family and social histories (including risk factors) reviewed, and no changes noted (except as noted below).  Past Medical History: Reviewed history from 01/14/2009 and no changes required. Palpitation Coronary artery disease, sees Dr. Riley Kill Diabetes mellitus, type II Hypertension atrial fibrilation GERD Depression  Past Surgical History: Reviewed history from 10/03/2006 and no changes required. angioplasty-06/2001 Lumbar laminectomy Coronary artery bypass graft LAD feb 27,2004 Tonsillectomy Cholecystectomy Hysterectomy  Family History: Reviewed history from 03/19/2008 and no changes required. Family History of Stroke F 1st degree relative <60 Fam hx OA Family History Diabetes 1st degree relative Family History Hypertension Father:mi age 39 died Family History of Renal Disease:   Social History: Reviewed history from 09/13/2006 and no changes required. Married Retired Never Smoked  Review of Systems  The patient denies anorexia, fever, weight loss, weight gain, vision loss, decreased hearing, hoarseness, chest pain, syncope, dyspnea on exertion, peripheral edema, prolonged cough, headaches, hemoptysis, abdominal pain, melena, hematochezia, severe indigestion/heartburn, hematuria, incontinence, genital sores, muscle weakness, suspicious skin lesions, transient blindness, difficulty walking, depression, unusual weight change, abnormal bleeding, enlarged lymph nodes, angioedema, and breast masses.    Physical Exam  General:  Well-developed,well-nourished,in no acute distress; alert,appropriate and cooperative throughout examination Head:  Normocephalic and atraumatic without obvious abnormalities. No apparent alopecia or balding. Eyes:  pupils equal and pupils round.   Ears:  R ear normal and L ear normal.   Nose:  no external deformity and no nasal discharge.   Neck:  No  deformities, masses, or tenderness noted. Lungs:  soft rhonchi, no rales Heart:  Normal.  Paradoxical splitting of the second heart sounds.normal rate.   Abdomen:  Bowel sounds positive,abdomen soft and non-tender without masses, organomegaly or hernias noted. Msk:  lumbar lordosis, SI joint tenderness, and trigger point tenderness.   Pulses:  R and L carotid,radial,femoral,dorsalis pedis and posterior tibial pulses are full and equal bilaterally Extremities:  trace left pedal edema and trace right pedal edema.   Neurologic:  alert & oriented X3 and cranial nerves II-XII intact.   Skin:  Intact without suspicious lesions or rashes   Impression & Recommendations:  Problem # 1:  HYPERCHOLESTEROLEMIA  IIA (ICD-272.0) Assessment Unchanged  Her updated medication list for this problem includes:    Lipitor 20 Mg Tabs (Atorvastatin calcium) .Marland Kitchen... 1 once daily  Labs Reviewed: SGOT: 23 (12/19/2007)  SGPT: 29 (12/19/2007)  Prior 10 Yr Risk Heart Disease: N/A (11/22/2006)   HDL:29.60 (11/28/2008), 29.70 (09/25/2008)  LDL:133 (12/19/2007), 102 (01/22/2007)  Chol:156 (11/28/2008), 122 (09/25/2008)  Trig:128 (12/19/2007), 118 (01/22/2007)  Problem # 2:  DIABETES MELLITUS, TYPE II (ICD-250.00) Assessment: Unchanged  Her updated medication list for this problem includes:    Micardis 20 Mg Tabs (Telmisartan) .Marland Kitchen... 1 once daily    Amaryl 4 Mg Tabs (Glimepiride) ..... One p.o. b.i.d.    Byetta 10 Mcg Pen 10 Mcg/0.56ml Soln (Exenatide) ..... One injection twice daily    Levemir Flexpen 100 Unit/ml Soln (Insulin detemir) .Marland Kitchen... Take 20 u  of levemir two times a day unless the fasting sugarsr drop below 100  then cut to 15 bid    Novolog Penfill 100 Unit/ml Soln (Insulin aspart) .Marland KitchenMarland KitchenMarland KitchenMarland Kitchen 10 units  before meals or as directed  Labs Reviewed: Creat: 0.8 (02/02/2009)    Reviewed HgBA1c results: 7.6 (02/02/2009)  8.1 (11/28/2008)  Problem # 3:  HYPERTENSION (ICD-401.9)  Her updated medication list for  this problem includes:    Toprol Xl 100 Mg Xr24h-tab (Metoprolol succinate) .Marland Kitchen... 1 once daily    Hydrochlorothiazide 12.5 Mg Tabs (Hydrochlorothiazide) .Marland Kitchen... Take 1 tablet by mouth once a day    Spironolactone 25 Mg Tabs (Spironolactone) .Marland Kitchen... 1 once daily    Micardis 20 Mg Tabs (Telmisartan) .Marland Kitchen... 1 once daily  BP today: 130/80 Prior BP: 140/80 (04/24/2009)  Prior 10 Yr Risk Heart Disease: N/A (11/22/2006)  Labs Reviewed: K+: 3.6 (02/02/2009) Creat: : 0.8 (02/02/2009)   Chol: 156 (11/28/2008)   HDL: 29.60 (11/28/2008)   LDL: 133 (12/19/2007)   TG: 128 (12/19/2007)  Problem # 4:  DEPRESSION (ICD-311)  Her updated medication list for this problem includes:    Alprazolam 1 Mg Tabs (Alprazolam) ..... One by mouth three times a day as needed for grief    Lexapro 10 Mg Tabs (Escitalopram oxalate) ..... One  by mouth daily  Discussed treatment options, including trial of antidpressant medication. Will refer to behavioral health. Follow-up call in in 24-48 hours and recheck in 2 weeks, sooner as needed. Patient agrees to call if any worsening of symptoms or thoughts of doing harm arise. Verified that the patient has no suicidal ideation at this time.   Complete Medication List: 1)  Toprol Xl 100 Mg Xr24h-tab (Metoprolol succinate) .Marland Kitchen.. 1 once daily 2)  Alprazolam 1 Mg Tabs (Alprazolam) .... One by mouth three times a day as needed for grief 3)  Hydrochlorothiazide 12.5 Mg Tabs (Hydrochlorothiazide) .... Take 1 tablet by mouth once a day 4)  Spironolactone 25 Mg Tabs (Spironolactone) .Marland Kitchen.. 1 once daily 5)  Hydrocodone-acetaminophen 10-325 Mg Tabs (Hydrocodone-acetaminophen) .... 1/2 to 1 by mouth q 6 hours as needed 6)  Micardis 20 Mg Tabs (Telmisartan) .Marland Kitchen.. 1 once daily 7)  Prevacid 30 Mg Cpdr (Lansoprazole) .... Take 1 tablet by mouth two times a day 8)  Amaryl 4 Mg Tabs (Glimepiride) .... One p.o. b.i.d. 9)  Lanoxin 0.25 Mg Tabs (Digoxin) .Marland Kitchen.. 1 once daily 10)  Byetta 10 Mcg Pen 10  Mcg/0.1ml Soln (Exenatide) .... One injection twice daily 11)  Bd Insulin Syringe Half-unit 31g X 5/16" 0.3 Ml Misc (Insulin syringe-needle u-100) .... 7 times a day 12)  Nitrostat 0.4 Mg Subl (Nitroglycerin) .... Use as directed 13)  Levemir Flexpen 100 Unit/ml Soln (Insulin detemir) .... Take 20 u  of levemir two times a day unless the fasting sugarsr drop below 100  then cut to 15 bid  14)  Onetouch Ultra Test Strp (Glucose blood) .... Use 6 times a day 15)  Novolog Penfill 100 Unit/ml Soln (Insulin aspart) .Marland Kitchen.. 10 units  before meals or as directed 16)  Klor-con M20 20 Meq Cr-tabs (Potassium chloride crys cr) .... Take one and one-half tablet daily 17)  Lipitor 20 Mg Tabs (Atorvastatin calcium) .Marland Kitchen.. 1 once daily 18)  Warfarin Sodium 6 Mg Tabs (Warfarin sodium) .... Take as directed by coumadin clinic. 19)  Digoxin 0.25 Mg Tabs (Digoxin) .... Take 1 by mouth once a day 20)  Gabapentin 300 Mg Caps (Gabapentin) .Marland Kitchen.. 1 three times a day 21)  Lexapro 10 Mg Tabs (Escitalopram oxalate) .... One  by mouth daily 22)  Betamethasone Dipropionate Aug 0.05 % Crea (Betamethasone dipropionate aug) .... Apply to rash two times a day 23)  Fluconazole 150 Mg Tabs (Fluconazole) .... One by mouth now and repeat in 5 days  Hypertension Assessment/Plan:      The patient's hypertensive risk group is category C: Target organ damage and/or diabetes.  Today's blood pressure is 130/80.  Her blood pressure goal is < 130/80.  Patient Instructions: 1)  Please schedule a follow-up appointment in 6 weeks. 2)  take 1/2 of lexapro    a day Prescriptions: FLUCONAZOLE 150 MG TABS (FLUCONAZOLE) one by mouth now and repeat in 5 days  #2 x 1   Entered and Authorized by:   Stacie Glaze MD   Signed by:   Stacie Glaze MD on 05/25/2009   Method used:   [facility]        [facility] [address] [phone]* (retail)       [address]       [place], [place]  [address]       Ph: [phone] or  [phone]       Fax: [phone]   RxID:   [id number] BETAMETHASONE DIPROPIONATE AUG 0.05 % CREA (BETAMETHASONE DIPROPIONATE AUG) apply to rash two times a day  #30gm x 0   Entered and Authorized by:   Stacie Glaze MD   Signed by:   Stacie Glaze MD on 05/25/2009   Method used:   [facility]        [facility] [address] [phone]* (retail)       [address]       [place], [place]  [address]       Ph: [phone] or [phone]       Fax: [phone]   RxID:   [id number] KLOR-CON M20 20 MEQ CR-TABS (POTASSIUM CHLORIDE CRYS CR) take one and one-half tablet daily  #60 x 6   Entered by:   Willy Eddy, LPN   Authorized by:   Stacie Glaze MD   Signed by:   Willy Eddy, LPN on 42/70/6237   Method used:   [facility]        [facility] [address] [phone]* (retail)       [address]       [place], [place]  [address]       Ph: [phone] or [phone]       Fax: [phone]   RxID:   [id number]

## 2010-04-08 NOTE — Assessment & Plan Note (Signed)
Summary: FU/NWS   Vital Signs:  Patient profile:   65 year old female Height:      61 inches (154.94 cm) Weight:      223.75 pounds (101.70 kg) BMI:     42.43 O2 Sat:      96 % on Room air Temp:     98.8 degrees F (37.11 degrees C) oral Pulse rate:   71 / minute Pulse rhythm:   regular BP sitting:   122 / 76  (right arm) Cuff size:   large  Vitals Entered By: Brenton Grills CMA (AAMA) (March 10, 2010 1:17 PM)  O2 Flow:  Room air CC: F/U on DM/aj Is Patient Diabetic? Yes   Primary Provider:  Stacie Glaze MD  CC:  F/U on DM/aj.  History of Present Illness: no cbg record, but states cbg's are mostly in the low to mid-100's, with no trend throughout the day.  it is still highest at hs (200's).  she says her diet is not good.  she reports weight gain.  she is approachinbg the 1st anniversary of her grandson's death, due to cancer.    Current Medications (verified): 1)  Toprol Xl 100 Mg Xr24h-Tab (Metoprolol Succinate) .Marland Kitchen.. 1 Once Daily 2)  Alprazolam 1 Mg Tabs (Alprazolam) .... One By Mouth Three Times A Day As Needed For Grief 3)  Hydrochlorothiazide 12.5 Mg  Tabs (Hydrochlorothiazide) .... Take 1 Tablet By Mouth Once A Day 4)  Spironolactone 25 Mg  Tabs (Spironolactone) .Marland Kitchen.. 1 Once Daily 5)  Hydrocodone-Acetaminophen 10-325 Mg Tabs (Hydrocodone-Acetaminophen) .... 1/2 To 1 By Mouth Q 6 Hours As Needed 6)  Micardis 40 Mg Tabs (Telmisartan) .Marland Kitchen.. 1 Once Daily or As Directed 7)  Prevacid 30 Mg  Cpdr (Lansoprazole) .... As Needed 8)  Byetta 10 Mcg Pen 10 Mcg/0.19ml Soln (Exenatide) .... One Injection Twice Daily 9)  Bd Insulin Syringe Half-Unit 31g X 5/16" 0.3 Ml Misc (Insulin Syringe-Needle U-100) .... 7 Times A Day 10)  Nitrostat 0.4 Mg  Subl (Nitroglycerin) .... Use As Directed 11)  Levemir Flexpen 100 Unit/ml Soln (Insulin Detemir) .... Take 10 Units At Bedtime 12)  Onetouch Ultra Test  Strp (Glucose Blood) .... Use 6 Times A Day 13)  Novolog Penfill 100 Unit/ml Soln  (Insulin Aspart) .... 30 Units Three Times A Day (Just Before Each Meal) 14)  Klor-Con M20 20 Meq Cr-Tabs (Potassium Chloride Crys Cr) .... Take One and One-Half Tablet Daily 15)  Lipitor 20 Mg Tabs (Atorvastatin Calcium) .Marland Kitchen.. 1 Once Daily 16)  Warfarin Sodium 6 Mg Tabs (Warfarin Sodium) .... Take As Directed By Coumadin Clinic. 17)  Digoxin 0.25 Mg Tabs (Digoxin) .... Take 1 By Mouth Once A Day 18)  Gabapentin 300 Mg Caps (Gabapentin) .Marland Kitchen.. 1 Three Times A Day 19)  Cyclobenzaprine Hcl 10 Mg Tabs (Cyclobenzaprine Hcl) .Marland Kitchen.. 1 Tablet By Mouth Two Times A Day 20)  Aspirin 81 Mg Tbec (Aspirin) .... Take One Tablet By Mouth Daily 21)  Fluconazole 150 Mg Tabs (Fluconazole) .Marland Kitchen.. 1 Today and 1 in 5 Days  Allergies (verified): 1)  ! Sulfa 2)  ! Morphine 3)  ! Talwin 4)  ! Codeine 5)  ! Vibramycin  Past History:  Past Medical History: Last updated: 07/15/2009 Current Problems:  CORONARY ARTERY DISEASE (ICD-414.00) ATRIAL FIBRILLATION (ICD-427.31) ANTICOAGULATION RX (ICD-V58.61) HYPERTENSION (ICD-401.9) HYPERLIPIDEMIA NEC/NOS (ICD-272.4) GERD (ICD-530.81) HYPOKALEMIA (ICD-276.8) DIABETES MELLITUS, TYPE II (ICD-250.00) DEPRESSION (ICD-311) GRIEF REACTION, ACUTE (ICD-309.0) ACUTE BRONCHITIS (ICD-466.0) BACK PAIN, LUMBAR, WITH RADICULOPATHY (ICD-724.4) LBBB (ICD-426.3)  HEADACHE (ICD-784.0) KNEE PAIN, RIGHT (ICD-719.46) COSTOCHONDRITIS, ACUTE (ICD-733.6) CHRONIC LYMPHADENITIS (ICD-289.1) URINARY FREQUENCY (ICD-788.41) OSTEOARTHRITIS (ICD-715.90) BURSITIS, ACROMIOCLAVICULAR, LEFT (ICD-726.10) FAMILY HISTORY DIABETES 1ST DEGREE RELATIVE (ICD-V18.0)  Review of Systems  The patient denies hypoglycemia.    Physical Exam  General:  morbidly obese.  no distress. Pulses:  dorsalis pedis intact bilat.   Extremities:  no deformity.  no ulcer on the feet.  feet are of normal color and temp.  no edema  Neurologic:  sensation is intact to touch on the feet  Additional Exam:  Hemoglobin  A1C       [H]  10.3 %     Impression & Recommendations:  Problem # 1:  DIAB W/PERIPH CIRC D/O TYPE I [JUV TYPE] UNCNTRL (ICD-250.73) needs increased rx  Problem # 2:  DEPRESSION (ICD-311) this limits the rx of #1  Medications Added to Medication List This Visit: 1)  Onetouch Ultra Test Strp (Glucose blood) .... Use 2 times a day 2)  Novolog Flexpen 100 Unit/ml Soln (Insulin aspart) .... 40 units three times a day (just before each meal), and pen needles 4/day  Other Orders: TLB-A1C / Hgb A1C (Glycohemoglobin) (83036-A1C) Est. Patient Level III ([address])  Patient Instructions: 1)  check your blood sugar 2 times a day.  vary the time of day when you check, between before the 3 meals, and at bedtime.  also check if you have symptoms of your blood sugar being too high or too low.  please keep a record of the readings and bring it to your next appointment here.  please call us sooner if you are having low blood sugar episodes. 2)  blood tests are being ordered for you today.  please call [phone] to hear your test results. 3)  pending the test results, please continue levemir 10 units at bedtime, and increase novolog to 40 units three times a day (just before each meal). 4)  Please schedule a follow-up appointment in 3-4 weeks.   5)  (update: i left message on phone-tree:  rx as we discussed) Prescriptions: NOVOLOG FLEXPEN 100 UNIT/ML SOLN (INSULIN ASPART) 40 units three times a day (just before each meal), and pen needles 4/day  #3 boxes x 11   Entered and Authorized by:   Minus Breeding MD   Signed by:   Minus Breeding MD on 03/10/2010   Method used:   [facility]        [facility] [address] [phone]* (retail)       [address]       [place], [place]  [address]       Ph: [phone] or [phone]       Fax: [phone]   RxID:   [id number]    Orders Added: 1)  TLB-A1C / Hgb A1C (Glycohemoglobin) [83036-A1C] 2)  Est. Patient Level III [956[address]]

## 2010-04-08 NOTE — Progress Notes (Signed)
  Phone Note Call from Patient   Caller: Patient Call For: Stacie Glaze MD Reason for Call: Acute Illness Summary of Call: Pt is having symptoms of vaginal itching and would like a Diflucan called to [facility])  Initial call taken by: Lynann Beaver CMA AAMA,  February 23, 2010 3:07 PM  Follow-up for Phone Call        per [name]- may have diflucan 150 today and again 1in 5 days Follow-up by: Willy Eddy, LPN,  February 23, 2010 4:25 PM    New/Updated Medications: FLUCONAZOLE 150 MG TABS (FLUCONAZOLE) 1 today and 1 in 5 days Prescriptions: FLUCONAZOLE 150 MG TABS (FLUCONAZOLE) 1 today and 1 in 5 days  #2 x 0   Entered by:   Willy Eddy, LPN   Authorized by:   Stacie Glaze MD   Signed by:   Willy Eddy, LPN on 16/12/9602   Method used:   [facility] to        [facility] [address] [phone]* (retail)       [address]       [place], [place]  [address]       Ph: [phone] or [phone]       Fax: [phone]   RxID:   [id number]

## 2010-04-08 NOTE — Progress Notes (Signed)
Summary: Rx refill req  Phone Note Refill Request Message from:  Patient on March 09, 2010 1:26 PM  Refills Requested: Medication #1:  NOVOLOG PENFILL 100 UNIT/ML SOLN 30 units three times a day (just before each meal)   Dosage confirmed as above?Dosage Confirmed   Supply Requested: 1 month  Method Requested: Electronic Initial call taken by: Margaret Pyle, CMA,  March 09, 2010 1:26 PM    Prescriptions: NOVOLOG PENFILL 100 UNIT/ML SOLN (INSULIN ASPART) 30 units three times a day (just before each meal)  #52mth x 1   Entered by:   Margaret Pyle, CMA   Authorized by:   Minus Breeding MD   Signed by:   Margaret Pyle, CMA on 03/09/2010   Method used:   [facility]        [facility] [phone]* (retail)       [address]       [place], [place]  [address]       Ph: [phone] or [phone]       Fax: [phone]   RxID:   [id number]

## 2010-04-08 NOTE — Assessment & Plan Note (Signed)
Summary: 2 month rov/njr/pt rescd//ccm   Vital Signs:  Patient profile:   65 year old female Height:      61 inches Weight:      220 pounds BMI:     41.72 Temp:     98.2 degrees F oral Pulse rate:   72 / minute Resp:     14 per minute BP sitting:   140 / 78  (left arm)  Vitals Entered By: Willy Eddy, LPN (February 12, 2010 3:04 PM) CC: roa-, Hypertension Management Is Patient Diabetic? Yes Did you bring your meter with you today? No   Primary Care Provider:  Stacie Glaze MD  CC:  roa- and Hypertension Management.  History of Present Illness: The pt continues to have issues with depression over the loss of her grandson and isolations issues. The date of the anniversary  of loss has made the issues worse. The patients  under  very highstress. She is tearfull at htis visit   Follow-Up Visit      This is a 65 year old woman who presents for Follow-up visit.  The patient denies chest pain, palpitations, dizziness, syncope, low blood sugar symptoms, high blood sugar symptoms, edema, SOB, DOE, PND, and orthopnea.  Since the last visit the patient notes depression worsening .  The patient reports monitoring BP.  When questioned about possible medication side effects, the patient notes depressive symptoms.    Hypertension History:      She denies headache, chest pain, palpitations, dyspnea with exertion, orthopnea, PND, peripheral edema, visual symptoms, neurologic problems, syncope, and side effects from treatment.        Positive major cardiovascular risk factors include female age 34 years old or older, diabetes, hyperlipidemia, hypertension, and family history for ischemic heart disease (males less than 73 years old).  Negative major cardiovascular risk factors include non-tobacco-user status.        Positive history for target organ damage include ASHD (either angina/prior MI/prior CABG), cardiac end organ damage (either CHF or LVH), prior stroke (or TIA), and peripheral  vascular disease.  Further assessment for target organ damage reveals no history of renal insufficiency or hypertensive retinopathy.     Preventive Screening-Counseling & Management  Alcohol-Tobacco     Smoking Status: never     Passive Smoke Exposure: no     Tobacco Counseling: not indicated; no tobacco use  Problems Prior to Update: 1)  Cervical Radiculopathy, Left  (ICD-723.4) 2)  Shoulder Pain, Left  (ICD-719.41) 3)  Pulmonary Congestion  (ICD-786.9) 4)  Diab W/periph Circ D/o Type I [juv Type] Uncntrl  (ICD-250.73) 5)  Rib Pain, Left Sided  (ICD-786.50) 6)  Coronary Artery Disease  (ICD-414.00) 7)  Atrial Fibrillation  (ICD-427.31) 8)  Anticoagulation Rx  (ICD-V58.61) 9)  Hypertension  (ICD-401.9) 10)  Hyperlipidemia Nec/nos  (ICD-272.4) 11)  Gerd  (ICD-530.81) 12)  Hypokalemia  (ICD-276.8) 13)  Depression  (ICD-311) 14)  Grief Reaction, Acute  (ICD-309.0) 15)  Acute Bronchitis  (ICD-466.0) 16)  Back Pain, Lumbar, With Radiculopathy  (ICD-724.4) 17)  Lbbb  (ICD-426.3) 18)  Headache  (ICD-784.0) 19)  Knee Pain, Right  (ICD-719.46) 20)  Costochondritis, Acute  (ICD-733.6) 21)  Chronic Lymphadenitis  (ICD-289.1) 22)  Urinary Frequency  (ICD-788.41) 23)  Osteoarthritis  (ICD-715.90) 24)  Bursitis, Acromioclavicular, Left  (ICD-726.10) 25)  Family History Diabetes 1st Degree Relative  (ICD-V18.0)  Current Problems (verified): 1)  Cervical Radiculopathy, Left  (ICD-723.4) 2)  Shoulder Pain, Left  (ICD-719.41) 3)  Pulmonary Congestion  (ICD-786.9) 4)  Diab w/o Comp Type I [juv] Not Stated Uncntrl  (ICD-250.01) 5)  Rib Pain, Left Sided  (ICD-786.50) 6)  Coronary Artery Disease  (ICD-414.00) 7)  Atrial Fibrillation  (ICD-427.31) 8)  Anticoagulation Rx  (ICD-V58.61) 9)  Hypertension  (ICD-401.9) 10)  Hyperlipidemia Nec/nos  (ICD-272.4) 11)  Gerd  (ICD-530.81) 12)  Hypokalemia  (ICD-276.8) 13)  Diabetes Mellitus, Type II  (ICD-250.00) 14)  Depression  (ICD-311) 15)   Grief Reaction, Acute  (ICD-309.0) 16)  Acute Bronchitis  (ICD-466.0) 17)  Back Pain, Lumbar, With Radiculopathy  (ICD-724.4) 18)  Lbbb  (ICD-426.3) 19)  Headache  (ICD-784.0) 20)  Knee Pain, Right  (ICD-719.46) 21)  Costochondritis, Acute  (ICD-733.6) 22)  Chronic Lymphadenitis  (ICD-289.1) 23)  Urinary Frequency  (ICD-788.41) 24)  Osteoarthritis  (ICD-715.90) 25)  Bursitis, Acromioclavicular, Left  (ICD-726.10) 26)  Family History Diabetes 1st Degree Relative  (ICD-V18.0)  Medications Prior to Update: 1)  Toprol Xl 100 Mg Xr24h-Tab (Metoprolol Succinate) .Marland Kitchen.. 1 Once Daily 2)  Alprazolam 1 Mg Tabs (Alprazolam) .... One By Mouth Three Times A Day As Needed For Grief 3)  Hydrochlorothiazide 12.5 Mg  Tabs (Hydrochlorothiazide) .... Take 1 Tablet By Mouth Once A Day 4)  Spironolactone 25 Mg  Tabs (Spironolactone) .Marland Kitchen.. 1 Once Daily 5)  Hydrocodone-Acetaminophen 10-325 Mg Tabs (Hydrocodone-Acetaminophen) .... 1/2 To 1 By Mouth Q 6 Hours As Needed 6)  Micardis 40 Mg Tabs (Telmisartan) .Marland Kitchen.. 1 Once Daily or As Directed 7)  Prevacid 30 Mg  Cpdr (Lansoprazole) .... As Needed 8)  Byetta 10 Mcg Pen 10 Mcg/0.21ml Soln (Exenatide) .... One Injection Twice Daily 9)  Bd Insulin Syringe Half-Unit 31g X 5/16" 0.3 Ml Misc (Insulin Syringe-Needle U-100) .... 7 Times A Day 10)  Nitrostat 0.4 Mg  Subl (Nitroglycerin) .... Use As Directed 11)  Levemir Flexpen 100 Unit/ml Soln (Insulin Detemir) .... Take 10 Units At Bedtime 12)  Onetouch Ultra Test  Strp (Glucose Blood) .... Use 6 Times A Day 13)  Novolog Penfill 100 Unit/ml Soln (Insulin Aspart) .... 30 Units Three Times A Day (Just Before Each Meal) 14)  Klor-Con M20 20 Meq Cr-Tabs (Potassium Chloride Crys Cr) .... Take One and One-Half Tablet Daily 15)  Lipitor 20 Mg Tabs (Atorvastatin Calcium) .Marland Kitchen.. 1 Once Daily 16)  Warfarin Sodium 6 Mg Tabs (Warfarin Sodium) .... Take As Directed By Coumadin Clinic. 17)  Digoxin 0.25 Mg Tabs (Digoxin) .... Take 1 By Mouth  Once A Day 18)  Gabapentin 300 Mg Caps (Gabapentin) .Marland Kitchen.. 1 Three Times A Day 19)  Cyclobenzaprine Hcl 10 Mg Tabs (Cyclobenzaprine Hcl) .Marland Kitchen.. 1 Tablet By Mouth Two Times A Day 20)  Aspirin 81 Mg Tbec (Aspirin) .... Take One Tablet By Mouth Daily  Current Medications (verified): 1)  Toprol Xl 100 Mg Xr24h-Tab (Metoprolol Succinate) .Marland Kitchen.. 1 Once Daily 2)  Alprazolam 1 Mg Tabs (Alprazolam) .... One By Mouth Three Times A Day As Needed For Grief 3)  Hydrochlorothiazide 12.5 Mg  Tabs (Hydrochlorothiazide) .... Take 1 Tablet By Mouth Once A Day 4)  Spironolactone 25 Mg  Tabs (Spironolactone) .Marland Kitchen.. 1 Once Daily 5)  Hydrocodone-Acetaminophen 10-325 Mg Tabs (Hydrocodone-Acetaminophen) .... 1/2 To 1 By Mouth Q 6 Hours As Needed 6)  Micardis 40 Mg Tabs (Telmisartan) .Marland Kitchen.. 1 Once Daily or As Directed 7)  Prevacid 30 Mg  Cpdr (Lansoprazole) .... As Needed 8)  Byetta 10 Mcg Pen 10 Mcg/0.35ml Soln (Exenatide) .... One Injection Twice Daily 9)  Bd Insulin Syringe Half-Unit  31g X 5/16" 0.3 Ml Misc (Insulin Syringe-Needle U-100) .... 7 Times A Day 10)  Nitrostat 0.4 Mg  Subl (Nitroglycerin) .... Use As Directed 11)  Levemir Flexpen 100 Unit/ml Soln (Insulin Detemir) .... Take 10 Units At Bedtime 12)  Onetouch Ultra Test  Strp (Glucose Blood) .... Use 6 Times A Day 13)  Novolog Penfill 100 Unit/ml Soln (Insulin Aspart) .... 30 Units Three Times A Day (Just Before Each Meal) 14)  Klor-Con M20 20 Meq Cr-Tabs (Potassium Chloride Crys Cr) .... Take One and One-Half Tablet Daily 15)  Lipitor 20 Mg Tabs (Atorvastatin Calcium) .Marland Kitchen.. 1 Once Daily 16)  Warfarin Sodium 6 Mg Tabs (Warfarin Sodium) .... Take As Directed By Coumadin Clinic. 17)  Digoxin 0.25 Mg Tabs (Digoxin) .... Take 1 By Mouth Once A Day 18)  Gabapentin 300 Mg Caps (Gabapentin) .Marland Kitchen.. 1 Three Times A Day 19)  Cyclobenzaprine Hcl 10 Mg Tabs (Cyclobenzaprine Hcl) .Marland Kitchen.. 1 Tablet By Mouth Two Times A Day 20)  Aspirin 81 Mg Tbec (Aspirin) .... Take One Tablet By  Mouth Daily  Allergies (verified): 1)  ! Sulfa 2)  ! Morphine 3)  ! Talwin 4)  ! Codeine 5)  ! Vibramycin  Past History:  Family History: Last updated: 11/12/2009 Family History of Stroke F 1st degree relative <60 Fam hx OA Family History Diabetes 1st degree relative Family History Hypertension Father:mi age 37 died Family History of Renal Disease:   Social History: Last updated: 07/15/2009 Married Retired Never Smoked  Risk Factors: Exercise: no (01/29/2007)  Risk Factors: Smoking Status: never (02/12/2010) Passive Smoke Exposure: no (02/12/2010)  Past medical, surgical, family and social histories (including risk factors) reviewed, and no changes noted (except as noted below).  Past Medical History: Reviewed history from 07/15/2009 and no changes required. Current Problems:  CORONARY ARTERY DISEASE (ICD-414.00) ATRIAL FIBRILLATION (ICD-427.31) ANTICOAGULATION RX (ICD-V58.61) HYPERTENSION (ICD-401.9) HYPERLIPIDEMIA NEC/NOS (ICD-272.4) GERD (ICD-530.81) HYPOKALEMIA (ICD-276.8) DIABETES MELLITUS, TYPE II (ICD-250.00) DEPRESSION (ICD-311) GRIEF REACTION, ACUTE (ICD-309.0) ACUTE BRONCHITIS (ICD-466.0) BACK PAIN, LUMBAR, WITH RADICULOPATHY (ICD-724.4) LBBB (ICD-426.3) HEADACHE (ICD-784.0) KNEE PAIN, RIGHT (ICD-719.46) COSTOCHONDRITIS, ACUTE (ICD-733.6) CHRONIC LYMPHADENITIS (ICD-289.1) URINARY FREQUENCY (ICD-788.41) OSTEOARTHRITIS (ICD-715.90) BURSITIS, ACROMIOCLAVICULAR, LEFT (ICD-726.10) FAMILY HISTORY DIABETES 1ST DEGREE RELATIVE (ICD-V18.0)  Past Surgical History: Reviewed history from 07/15/2009 and no changes required. angioplasty-06/2001 Lumbar laminectomy Coronary artery bypass graft LAD feb 27,2004 Tonsillectomy Cholecystectomy Hysterectomy  Family History: Reviewed history from 11/12/2009 and no changes required. Family History of Stroke F 1st degree relative <60 Fam hx OA Family History Diabetes 1st degree relative Family History  Hypertension Father:mi age 31 died Family History of Renal Disease:   Social History: Reviewed history from 07/15/2009 and no changes required. Married Retired Never Smoked  Review of Systems  The patient denies anorexia, fever, weight loss, weight gain, vision loss, decreased hearing, hoarseness, chest pain, syncope, dyspnea on exertion, peripheral edema, prolonged cough, headaches, hemoptysis, abdominal pain, melena, hematochezia, severe indigestion/heartburn, hematuria, incontinence, genital sores, muscle weakness, suspicious skin lesions, transient blindness, difficulty walking, depression, unusual weight change, abnormal bleeding, enlarged lymph nodes, angioedema, and breast masses.    Physical Exam  General:  Well-developed,well-nourished,in no acute distress; alert,appropriate and cooperative throughout examination Eyes:  pupils equal and pupils round.   Nose:  no external deformity and no nasal discharge.   Mouth:  Oral mucosa and oropharynx without lesions or exudates.  Teeth in good repair. Neck:  supple and decreased ROM.   Lungs:  normal respiratory effort and no wheezes.   Heart:  normal rate and regular  rhythm.   Abdomen:  Bowel sounds positive,abdomen soft and non-tender without masses, organomegaly or hernias noted. Msk:  lumbar lordosis, SI joint tenderness, and trigger point tenderness.   neck tight and tender Pulses:  R and L carotid,radial,femoral,dorsalis pedis and posterior tibial pulses are full and equal bilaterally Extremities:  trace left pedal edema and trace right pedal edema.   Neurologic:  alert & oriented X3 and cranial nerves II-XII intact.     Impression & Recommendations:  Problem # 1:  DIAB W/PERIPH CIRC D/O TYPE I [JUV TYPE] UNCNTRL (ICD-250.73)  Her updated medication list for this problem includes:    Micardis 40 Mg Tabs (Telmisartan) .Marland Kitchen... 1 once daily or as directed    Byetta 10 Mcg Pen 10 Mcg/0.80ml Soln (Exenatide) ..... One injection  twice daily    Levemir Flexpen 100 Unit/ml Soln (Insulin detemir) .Marland Kitchen... Take 10 units at bedtime    Novolog Penfill 100 Unit/ml Soln (Insulin aspart) .Marland KitchenMarland KitchenMarland KitchenMarland Kitchen 30 units three times a day (just before each meal)    Aspirin 81 Mg Tbec (Aspirin) .Marland Kitchen... Take one tablet by mouth daily  Labs Reviewed: Creat: 0.80 (10/30/2009)    Reviewed HgBA1c results: 11.4 (10/30/2009)  7.6 (02/02/2009)  Problem # 2:  HYPERTENSION (ICD-401.9)  Her updated medication list for this problem includes:    Toprol Xl 100 Mg Xr24h-tab (Metoprolol succinate) .Marland Kitchen... 1 once daily    Hydrochlorothiazide 12.5 Mg Tabs (Hydrochlorothiazide) .Marland Kitchen... Take 1 tablet by mouth once a day    Spironolactone 25 Mg Tabs (Spironolactone) .Marland Kitchen... 1 once daily    Micardis 40 Mg Tabs (Telmisartan) .Marland Kitchen... 1 once daily or as directed  BP today: 140/78 Prior BP: 122/76 (12/04/2009)  Prior 10 Yr Risk Heart Disease: N/A (11/22/2006)  Labs Reviewed: K+: 3.9 (10/30/2009) Creat: : 0.80 (10/30/2009)   Chol: 121 (08/21/2009)   HDL: 32.80 (08/21/2009)   LDL: 63 (08/21/2009)   TG: 124.0 (08/21/2009)  Problem # 3:  BACK PAIN, LUMBAR, WITH RADICULOPATHY (ICD-724.4)  Her updated medication list for this problem includes:    Hydrocodone-acetaminophen 10-325 Mg Tabs (Hydrocodone-acetaminophen) .Marland Kitchen... 1/2 to 1 by mouth q 6 hours as needed    Cyclobenzaprine Hcl 10 Mg Tabs (Cyclobenzaprine hcl) .Marland Kitchen... 1 tablet by mouth two times a day    Aspirin 81 Mg Tbec (Aspirin) .Marland Kitchen... Take one tablet by mouth daily  Discussed use of moist heat or ice, modified activities, medications, and stretching/strengthening exercises. Back care instructions given. To be seen in 2 weeks if no improvement; sooner if worsening of symptoms.   Problem # 4:  DEPRESSION (ICD-311) referral to councilling Her updated medication list for this problem includes:    Alprazolam 1 Mg Tabs (Alprazolam) ..... One by mouth three times a day as needed for grief  Discussed treatment options,  including trial of antidpressant medication. Will refer to behavioral health. Follow-up call in in 24-48 hours and recheck in 2 weeks, sooner as needed. Patient agrees to call if any worsening of symptoms or thoughts of doing harm arise. Verified that the patient has no suicidal ideation at this time.   Complete Medication List: 1)  Toprol Xl 100 Mg Xr24h-tab (Metoprolol succinate) .Marland Kitchen.. 1 once daily 2)  Alprazolam 1 Mg Tabs (Alprazolam) .... One by mouth three times a day as needed for grief 3)  Hydrochlorothiazide 12.5 Mg Tabs (Hydrochlorothiazide) .... Take 1 tablet by mouth once a day 4)  Spironolactone 25 Mg Tabs (Spironolactone) .Marland Kitchen.. 1 once daily 5)  Hydrocodone-acetaminophen 10-325 Mg Tabs (Hydrocodone-acetaminophen) .... 1/2 to  1 by mouth q 6 hours as needed 6)  Micardis 40 Mg Tabs (Telmisartan) .Marland Kitchen.. 1 once daily or as directed 7)  Prevacid 30 Mg Cpdr (Lansoprazole) .... As needed 8)  Byetta 10 Mcg Pen 10 Mcg/0.36ml Soln (Exenatide) .... One injection twice daily 9)  Bd Insulin Syringe Half-unit 31g X 5/16" 0.3 Ml Misc (Insulin syringe-needle u-100) .... 7 times a day 10)  Nitrostat 0.4 Mg Subl (Nitroglycerin) .... Use as directed 11)  Levemir Flexpen 100 Unit/ml Soln (Insulin detemir) .... Take 10 units at bedtime 12)  Onetouch Ultra Test Strp (Glucose blood) .... Use 6 times a day 13)  Novolog Penfill 100 Unit/ml Soln (Insulin aspart) .... 30 units three times a day (just before each meal) 14)  Klor-con M20 20 Meq Cr-tabs (Potassium chloride crys cr) .... Take one and one-half tablet daily 15)  Lipitor 20 Mg Tabs (Atorvastatin calcium) .Marland Kitchen.. 1 once daily 16)  Warfarin Sodium 6 Mg Tabs (Warfarin sodium) .... Take as directed by coumadin clinic. 17)  Digoxin 0.25 Mg Tabs (Digoxin) .... Take 1 by mouth once a day 18)  Gabapentin 300 Mg Caps (Gabapentin) .Marland Kitchen.. 1 three times a day 19)  Cyclobenzaprine Hcl 10 Mg Tabs (Cyclobenzaprine hcl) .Marland Kitchen.. 1 tablet by mouth two times a day 20)  Aspirin 81  Mg Tbec (Aspirin) .... Take one tablet by mouth daily 21)  Fluconazole 150 Mg Tabs (Fluconazole) .Marland Kitchen.. 1 today and 1 in 5 days  Other Orders: Protime ([id number]) Fingerstick (56213)  Hypertension Assessment/Plan:      The patient's hypertensive risk group is category C: Target organ damage and/or diabetes.  Today's blood pressure is 140/78.  Her blood pressure goal is < 130/80.  Patient Instructions: 1)  consider referral to councillor to discuss grief 2)  Please schedule a follow-up appointment in 3 months. Prescriptions: WARFARIN SODIUM 6 MG TABS (WARFARIN SODIUM) Take as directed by coumadin clinic.  #30 x 6   Entered and Authorized by:   Stacie Glaze MD   Signed by:   Stacie Glaze MD on 02/12/2010   Method used:   [facility]        [facility] [phone]* (retail)       [address]       [place], [place]  [address]       Ph: [phone] or [phone]       Fax: [phone]   RxID:   [id number]    Orders Added: 1)  Est. Patient Level IV [88416] 2)  Protime [[id number]] 3)  Fingerstick [[id number]]

## 2010-04-08 NOTE — Assessment & Plan Note (Signed)
Summary: f55m   Visit Type:  6 months follow up Primary Provider:  Stacie Glaze MD  CC:  Left arm edema- back pain.  History of Present Illness: Patient is tearful.  Still has not gotten over the death of her grandson.  Finds herself alone  quite a bit.  Her shoulders, back and hands hurt alot.  She has sharp pain under left breast, right parasternal area,   Her arm often gets tight and feels like it is swollen.    Problems Prior to Update: 1)  Cervical Radiculopathy, Left  (ICD-723.4) 2)  Shoulder Pain, Left  (ICD-719.41) 3)  Pulmonary Congestion  (ICD-786.9) 4)  Diab W/periph Circ D/o Type I [juv Type] Uncntrl  (ICD-250.73) 5)  Rib Pain, Left Sided  (ICD-786.50) 6)  Coronary Artery Disease  (ICD-414.00) 7)  Atrial Fibrillation  (ICD-427.31) 8)  Anticoagulation Rx  (ICD-V58.61) 9)  Hypertension  (ICD-401.9) 10)  Hyperlipidemia Nec/nos  (ICD-272.4) 11)  Gerd  (ICD-530.81) 12)  Hypokalemia  (ICD-276.8) 13)  Depression  (ICD-311) 14)  Grief Reaction, Acute  (ICD-309.0) 15)  Acute Bronchitis  (ICD-466.0) 16)  Back Pain, Lumbar, With Radiculopathy  (ICD-724.4) 17)  Lbbb  (ICD-426.3) 18)  Headache  (ICD-784.0) 19)  Knee Pain, Right  (ICD-719.46) 20)  Costochondritis, Acute  (ICD-733.6) 21)  Chronic Lymphadenitis  (ICD-289.1) 22)  Urinary Frequency  (ICD-788.41) 23)  Osteoarthritis  (ICD-715.90) 24)  Bursitis, Acromioclavicular, Left  (ICD-726.10) 25)  Family History Diabetes 1st Degree Relative  (ICD-V18.0)  Current Medications (verified): 1)  Toprol Xl 100 Mg Xr24h-Tab (Metoprolol Succinate) .Marland Kitchen.. 1 Once Daily 2)  Alprazolam 1 Mg Tabs (Alprazolam) .... One By Mouth Three Times A Day As Needed For Grief 3)  Hydrochlorothiazide 12.5 Mg  Tabs (Hydrochlorothiazide) .... Take 1 Tablet By Mouth Once A Day 4)  Spironolactone 25 Mg  Tabs (Spironolactone) .Marland Kitchen.. 1 Once Daily 5)  Hydrocodone-Acetaminophen 10-325 Mg Tabs (Hydrocodone-Acetaminophen) .... 1/2 To 1 By Mouth Q 6 Hours As  Needed 6)  Micardis 40 Mg Tabs (Telmisartan) .Marland Kitchen.. 1 Once Daily or As Directed 7)  Prevacid 30 Mg  Cpdr (Lansoprazole) .... As Needed 8)  Byetta 10 Mcg Pen 10 Mcg/0.73ml Soln (Exenatide) .... One Injection Twice Daily 9)  Bd Insulin Syringe Half-Unit 31g X 5/16" 0.3 Ml Misc (Insulin Syringe-Needle U-100) .... 7 Times A Day 10)  Nitrostat 0.4 Mg  Subl (Nitroglycerin) .... Use As Directed 11)  Levemir Flexpen 100 Unit/ml Soln (Insulin Detemir) .... Take 10 Units At Bedtime 12)  Onetouch Ultra Test  Strp (Glucose Blood) .... Use 6 Times A Day 13)  Novolog Penfill 100 Unit/ml Soln (Insulin Aspart) .... 30 Units Three Times A Day (Just Before Each Meal) 14)  Klor-Con M20 20 Meq Cr-Tabs (Potassium Chloride Crys Cr) .... Take One and One-Half Tablet Daily 15)  Lipitor 20 Mg Tabs (Atorvastatin Calcium) .Marland Kitchen.. 1 Once Daily 16)  Warfarin Sodium 6 Mg Tabs (Warfarin Sodium) .... Take As Directed By Coumadin Clinic. 17)  Digoxin 0.25 Mg Tabs (Digoxin) .... Take 1 By Mouth Once A Day 18)  Gabapentin 300 Mg Caps (Gabapentin) .Marland Kitchen.. 1 Three Times A Day 19)  Cyclobenzaprine Hcl 10 Mg Tabs (Cyclobenzaprine Hcl) .Marland Kitchen.. 1 Tablet By Mouth Two Times A Day 20)  Aspirin 81 Mg Tbec (Aspirin) .... Take One Tablet By Mouth Daily  Allergies: 1)  ! Sulfa 2)  ! Morphine 3)  ! Talwin 4)  ! Codeine 5)  ! Vibramycin  Vital Signs:  Patient profile:   65  year old female Height:      61 inches Weight:      220.50 pounds BMI:     41.81 Pulse rate:   75 / minute Pulse rhythm:   regular Resp:     18 per minute BP sitting:   128 / 76  (right arm) Cuff size:   large  Vitals Entered By: Vikki Ports (February 05, 2010 2:57 PM)  Physical Exam  General:  Well developed, well nourished, in no acute distress.  Somewhat tearful.  Head:  normocephalic and atraumatic Eyes:  PERRLA/EOM intact; conjunctiva and lids normal. Chest Wall:  some tenderness over lateral chest and central chest. Lungs:  Clear bilaterally to  auscultation and percussion. Heart:  Normal S1.  Paradoxical S2.  No definite murmur. Pulses:  pulses normal in all 4 extremities Extremities:  trace pedal edema.  ?slight puffiness upper arms ?? Neurologic:  Alert and oriented x 3.   EKG  Procedure date:  02/05/2010  Findings:      NSR. LBBB.  Impression & Recommendations:  Problem # 1:  CORONARY ARTERY DISEASE (ICD-414.00) stablel post CABG.  CP does not sound anginal.  Mostly MS.  Some tenderness Her updated medication list for this problem includes:    Toprol Xl 100 Mg Xr24h-tab (Metoprolol succinate) .Marland Kitchen... 1 once daily    Nitrostat 0.4 Mg Subl (Nitroglycerin) ..... Use as directed    Warfarin Sodium 6 Mg Tabs (Warfarin sodium) .Marland Kitchen... Take as directed by coumadin clinic.    Aspirin 81 Mg Tbec (Aspirin) .Marland Kitchen... Take one tablet by mouth daily  Orders: EKG w/ Interpretation (93000)  Problem # 2:  HYPERTENSION (ICD-401.9) controlled at present.  Her updated medication list for this problem includes:    Toprol Xl 100 Mg Xr24h-tab (Metoprolol succinate) .Marland Kitchen... 1 once daily    Hydrochlorothiazide 12.5 Mg Tabs (Hydrochlorothiazide) .Marland Kitchen... Take 1 tablet by mouth once a day    Spironolactone 25 Mg Tabs (Spironolactone) .Marland Kitchen... 1 once daily    Micardis 40 Mg Tabs (Telmisartan) .Marland Kitchen... 1 once daily or as directed    Aspirin 81 Mg Tbec (Aspirin) .Marland Kitchen... Take one tablet by mouth daily  Problem # 3:  HYPERLIPIDEMIA NEC/NOS (ICD-272.4) on lipid lowering treatment. Her updated medication list for this problem includes:    Lipitor 20 Mg Tabs (Atorvastatin calcium) .Marland Kitchen... 1 once daily  Other Orders: T-2 View CXR ([id number]) Venous Duplex Upper Extremity (Venous Duplex Upp)  Patient Instructions: 1)  Your physician recommends that you schedule a follow-up appointment in: 4 months. 2)  Your physician has requested that you have a lower or upper extremity venous duplex.  This test is an ultrasound of the veins in the legs or arms.  It looks at venous  blood flow that carries blood from the heart to the legs or arms.  Allow one hour for a Lower Venous exam.  Allow thirty minutes for an Upper Venous exam. There are no restrictions or special instructions. Left upper extremity. 3)  Pa and lateral Cxray. Pt. will go to [facility].

## 2010-04-09 ENCOUNTER — Encounter: Payer: Self-pay | Admitting: Endocrinology

## 2010-04-09 ENCOUNTER — Ambulatory Visit (INDEPENDENT_AMBULATORY_CARE_PROVIDER_SITE_OTHER): Payer: MEDICARE | Admitting: Endocrinology

## 2010-04-09 ENCOUNTER — Ambulatory Visit: Admit: 2010-04-09 | Payer: Self-pay | Admitting: Endocrinology

## 2010-04-09 DIAGNOSIS — E1059 Type 1 diabetes mellitus with other circulatory complications: Secondary | ICD-10-CM

## 2010-04-12 ENCOUNTER — Encounter: Payer: Self-pay | Admitting: Cardiology

## 2010-04-12 ENCOUNTER — Encounter (INDEPENDENT_AMBULATORY_CARE_PROVIDER_SITE_OTHER): Payer: MEDICARE

## 2010-04-12 DIAGNOSIS — I6789 Other cerebrovascular disease: Secondary | ICD-10-CM

## 2010-04-12 DIAGNOSIS — Z7901 Long term (current) use of anticoagulants: Secondary | ICD-10-CM

## 2010-04-14 NOTE — Assessment & Plan Note (Addendum)
Summary: 3-4 WK ROV/NWS   Vital Signs:  Patient profile:   65 year old female Height:      61 inches (154.94 cm) Weight:      224.50 pounds (102.05 kg) BMI:     42.57 O2 Sat:      97 % on Room air Temp:     98.5 degrees F (36.94 degrees C) oral Pulse rate:   88 / minute Pulse rhythm:   regular BP sitting:   118 / 72  (left arm) Cuff size:   large  Vitals Entered By: Brenton Grills CMA (AAMA) (April 09, 2010 1:26 PM)  O2 Flow:  Room air CC: Follow up on DM/aj Is Patient Diabetic? Yes   Primary Provider:  Stacie Glaze MD  CC:  Follow up on DM/aj.  History of Present Illness: no cbg record, but states cbg's are still highest at hs (200's), and lowest in am (low-100's).  she says half of her cbg's are still over 200.  she says her diet is still not good.   pt states 1 year of moderate pain at the left arm, but no assoc numbness.    Current Medications (verified): 1)  Toprol Xl 100 Mg Xr24h-Tab (Metoprolol Succinate) .Marland Kitchen.. 1 Once Daily 2)  Alprazolam 1 Mg Tabs (Alprazolam) .... One By Mouth Three Times A Day As Needed For Grief 3)  Hydrochlorothiazide 12.5 Mg  Tabs (Hydrochlorothiazide) .... Take 1 Tablet By Mouth Once A Day 4)  Spironolactone 25 Mg  Tabs (Spironolactone) .Marland Kitchen.. 1 Once Daily 5)  Hydrocodone-Acetaminophen 10-325 Mg Tabs (Hydrocodone-Acetaminophen) .... 1/2 To 1 By Mouth Q 6 Hours As Needed 6)  Micardis 40 Mg Tabs (Telmisartan) .Marland Kitchen.. 1 Once Daily or As Directed 7)  Prevacid 30 Mg  Cpdr (Lansoprazole) .... As Needed 8)  Byetta 10 Mcg Pen 10 Mcg/0.11ml Soln (Exenatide) .... One Injection Twice Daily 9)  Bd Insulin Syringe Half-Unit 31g X 5/16" 0.3 Ml Misc (Insulin Syringe-Needle U-100) .... 7 Times A Day 10)  Nitrostat 0.4 Mg  Subl (Nitroglycerin) .... Use As Directed 11)  Levemir Flexpen 100 Unit/ml Soln (Insulin Detemir) .... Take 10 Units At Bedtime 12)  Onetouch Ultra Test  Strp (Glucose Blood) .... Use 2 Times A Day 13)  Klor-Con M20 20 Meq Cr-Tabs (Potassium  Chloride Crys Cr) .... Take One and One-Half Tablet Daily 14)  Lipitor 20 Mg Tabs (Atorvastatin Calcium) .Marland Kitchen.. 1 Once Daily 15)  Warfarin Sodium 6 Mg Tabs (Warfarin Sodium) .... Take As Directed By Coumadin Clinic. 16)  Digoxin 0.25 Mg Tabs (Digoxin) .... Take 1 By Mouth Once A Day 17)  Gabapentin 300 Mg Caps (Gabapentin) .Marland Kitchen.. 1 Three Times A Day 18)  Cyclobenzaprine Hcl 10 Mg Tabs (Cyclobenzaprine Hcl) .Marland Kitchen.. 1 Tablet By Mouth Two Times A Day 19)  Aspirin 81 Mg Tbec (Aspirin) .... Take One Tablet By Mouth Daily 20)  Fluconazole 150 Mg Tabs (Fluconazole) .Marland Kitchen.. 1 Today and 1 in 5 Days 21)  Novolog Flexpen 100 Unit/ml Soln (Insulin Aspart) .... 40 Units Three Times A Day (Just Before Each Meal), and Pen Needles 4/day  Allergies (verified): 1)  ! Sulfa 2)  ! Morphine 3)  ! Talwin 4)  ! Codeine 5)  ! Vibramycin  Past History:  Past Medical History: Reviewed history from 07/15/2009 and no changes required. Current Problems:  CORONARY ARTERY DISEASE (ICD-414.00) ATRIAL FIBRILLATION (ICD-427.31) ANTICOAGULATION RX (ICD-V58.61) HYPERTENSION (ICD-401.9) HYPERLIPIDEMIA NEC/NOS (ICD-272.4) GERD (ICD-530.81) HYPOKALEMIA (ICD-276.8) DIABETES MELLITUS, TYPE II (ICD-250.00) DEPRESSION (ICD-311) GRIEF REACTION,  ACUTE (ICD-309.0) ACUTE BRONCHITIS (ICD-466.0) BACK PAIN, LUMBAR, WITH RADICULOPATHY (ICD-724.4) LBBB (ICD-426.3) HEADACHE (ICD-784.0) KNEE PAIN, RIGHT (ICD-719.46) COSTOCHONDRITIS, ACUTE (ICD-733.6) CHRONIC LYMPHADENITIS (ICD-289.1) URINARY FREQUENCY (ICD-788.41) OSTEOARTHRITIS (ICD-715.90) BURSITIS, ACROMIOCLAVICULAR, LEFT (ICD-726.10) FAMILY HISTORY DIABETES 1ST DEGREE RELATIVE (ICD-V18.0)  Review of Systems  The patient denies hypoglycemia, weight loss, and weight gain.    Physical Exam  General:  morbidly obese.  no distress. Msk:  left shoulder: nontender full rom, but painful  Pulses:  left radial is normal Neurologic:  sensation is intact to touch on the left upper  extremity Additional Exam:  i reviewed left shoulder x-ray results.     Impression & Recommendations:  Problem # 1:  DIAB W/PERIPH CIRC D/O TYPE I [JUV TYPE] UNCNTRL (ICD-250.73) needs increased rx  Problem # 2:  SHOULDER PAIN, LEFT (ICD-719.41) Assessment: Unchanged persistent  Medications Added to Medication List This Visit: 1)  Novolog Flexpen 100 Unit/ml Soln (Insulin aspart) .... 50 units three times a day (just before each meal), and pen needles 5/day  Other Orders: Orthopedic Surgeon Referral (Ortho Surgeon) Est. Patient Level IV (11914)  Patient Instructions: 1)  check your blood sugar 2 times a day.  vary the time of day when you check, between before the 3 meals, and at bedtime.  also check if you have symptoms of your blood sugar being too high or too low.  please keep a record of the readings and bring it to your next appointment here.  please call us sooner if you are having low blood sugar episodes. 2)  stop levemir. 3)  increase novolog to 50 units three times a day (just before each meal). 4)  Please schedule a follow-up appointment in 1 month. 5)  refer orthopedics.  you will be called with a day and time for an appointment   Orders Added: 1)  Orthopedic Surgeon Referral [Ortho Surgeon] 2)  Est. Patient Level IV [78295]

## 2010-04-16 ENCOUNTER — Encounter: Payer: Self-pay | Admitting: Cardiology

## 2010-04-21 ENCOUNTER — Telehealth (INDEPENDENT_AMBULATORY_CARE_PROVIDER_SITE_OTHER): Payer: Self-pay | Admitting: *Deleted

## 2010-04-22 NOTE — Letter (Signed)
Summary: Results Follow-up  [facility], Main Office  [address]   [place], [place] [address]   Phone: [phone]  Fax: [phone]     April 16, 2010 MRN: [id number]   Kristen [name] [address] [place], [place]  [address]   Dear Ms. [name],  We have received the results from your Venous ultrasound and have been unable to contact you.  This test was within normal.  If you have any questions please call our office at the number listed above.  Thank you,  [facility]

## 2010-04-22 NOTE — Medication Information (Signed)
Summary: Coumadin Clinic  Anticoagulant Therapy  Managed by: Louann Sjogren, PharmD Referring MD: Shawnie Pons MD PCP: Stacie Glaze MD Supervising MD: Jens Som MD, Arlys John Indication 1: CVA-stroke (ICD-436) Lab Used: [facility] Site: [facility] INR POC 2.1 INR RANGE 2 - 3  Dietary changes: no    Health status changes: no    Bleeding/hemorrhagic complications: no    Recent/future hospitalizations: no    Any changes in medication regimen? no    Recent/future dental: no  Any missed doses?: no       Is patient compliant with meds? yes       Allergies: 1)  ! Sulfa 2)  ! Morphine 3)  ! Talwin 4)  ! Codeine 5)  ! Vibramycin  Anticoagulation Management History:      The patient is taking warfarin and comes in today for a routine follow up visit.  Anticoagulation is being administered due to chronic atrial fibrillation.  Positive risk factors for bleeding include history of CVA/TIA and presence of serious comorbidities.  Negative risk factors for bleeding include an age less than 42 years old.  The bleeding index is 'intermediate risk'.  Positive CHADS2 values include History of HTN, History of Diabetes, and Prior Stroke/CVA/TIA.  Negative CHADS2 values include Age > 82 years old.  The start date was 07/06/2002.  Plans are to continue warfarin for life.  Her last INR was 2.9 and today's INR is 2.1.  Anticoagulation responsible provider: Jens Som MD, Arlys John.  INR POC: 2.1.  Cuvette Lot#: [id number].  Exp: 12/2010.    Anticoagulation Management Assessment/Plan:      The patient's current anticoagulation dose is Warfarin sodium 6 mg tabs: Take as directed by coumadin clinic..  The target INR is 2 - 3.  The next INR is due 05/07/2010.  Anticoagulation instructions were given to patient.  Results were reviewed/authorized by Louann Sjogren, PharmD.  She was notified by Louann Sjogren PharmD.         Prior Anticoagulation Instructions: INR 2.1  Continue on same dosage 1/2 tablet  daily except 1 tablet on Mondays.  Recheck in 4 weeks.    Current Anticoagulation Instructions: INR 2.1 (goal 2-3)  Continue taking 1/2 tablet everyday except take 1 tablet on Mondays.  Recheck INR in 4 weeks.

## 2010-04-28 NOTE — Progress Notes (Signed)
Summary: Question about a shake called Body VI  Phone Note Call from Patient Call back at Home Phone [phone] Call back at [phone]   Caller: Patient Summary of Call: Pt request call regarding a food suppelment shake called Body Vi Initial call taken by: Judie Grieve,  April 21, 2010 2:06 PM  Follow-up for Phone Call        Left message for pt to call back.  Our office cannot make recommendations about supplements due to lack of research and unsure how it effects pt's meds or electrolytes.  Julieta Gutting, RN, BSN  April 21, 2010 2:12 PM  pt called back and told pt above message Glynda Jaeger  April 21, 2010 4:59 PM

## 2010-05-05 ENCOUNTER — Encounter: Payer: Self-pay | Admitting: Cardiology

## 2010-05-05 DIAGNOSIS — Z7901 Long term (current) use of anticoagulants: Secondary | ICD-10-CM | POA: Insufficient documentation

## 2010-05-05 DIAGNOSIS — I4891 Unspecified atrial fibrillation: Secondary | ICD-10-CM

## 2010-05-07 ENCOUNTER — Ambulatory Visit: Payer: MEDICARE | Admitting: Endocrinology

## 2010-05-10 ENCOUNTER — Ambulatory Visit: Payer: MEDICARE | Admitting: Endocrinology

## 2010-05-11 ENCOUNTER — Encounter (INDEPENDENT_AMBULATORY_CARE_PROVIDER_SITE_OTHER): Payer: Medicare Other

## 2010-05-11 ENCOUNTER — Encounter: Payer: Self-pay | Admitting: Cardiology

## 2010-05-11 DIAGNOSIS — I6789 Other cerebrovascular disease: Secondary | ICD-10-CM

## 2010-05-11 DIAGNOSIS — Z7901 Long term (current) use of anticoagulants: Secondary | ICD-10-CM

## 2010-05-19 ENCOUNTER — Other Ambulatory Visit: Payer: Self-pay | Admitting: Internal Medicine

## 2010-05-19 DIAGNOSIS — Z1231 Encounter for screening mammogram for malignant neoplasm of breast: Secondary | ICD-10-CM

## 2010-05-21 ENCOUNTER — Encounter: Payer: Self-pay | Admitting: Endocrinology

## 2010-05-21 ENCOUNTER — Ambulatory Visit (INDEPENDENT_AMBULATORY_CARE_PROVIDER_SITE_OTHER): Payer: Medicare Other | Admitting: Endocrinology

## 2010-05-21 DIAGNOSIS — E1059 Type 1 diabetes mellitus with other circulatory complications: Secondary | ICD-10-CM

## 2010-05-24 ENCOUNTER — Telehealth: Payer: Self-pay | Admitting: Cardiology

## 2010-05-25 NOTE — Medication Information (Signed)
Summary: rov/tm  Anticoagulant Therapy  Managed by: Windell Hummingbird, RN Referring MD: Shawnie Pons MD PCP: Stacie Glaze MD Supervising MD: Jens Som MD, Arlys John Indication 1: CVA-stroke (ICD-436) Lab Used: [facility] Site: [facility] INR POC 1.8 INR RANGE 2 - 3  Dietary changes: no    Health status changes: no    Bleeding/hemorrhagic complications: no    Recent/future hospitalizations: no    Any changes in medication regimen? no    Recent/future dental: no  Any missed doses?: no       Is patient compliant with meds? yes       Allergies: 1)  ! Sulfa 2)  ! Morphine 3)  ! Talwin 4)  ! Codeine 5)  ! Vibramycin  Anticoagulation Management History:      The patient is taking warfarin and comes in today for a routine follow up visit.  Anticoagulation is being administered due to chronic atrial fibrillation.  Positive risk factors for bleeding include history of CVA/TIA and presence of serious comorbidities.  Negative risk factors for bleeding include an age less than 42 years old.  The bleeding index is 'intermediate risk'.  Positive CHADS2 values include History of HTN, History of Diabetes, and Prior Stroke/CVA/TIA.  Negative CHADS2 values include Age > 75 years old.  The start date was 07/06/2002.  Plans are to continue warfarin for life.  Her last INR was 2.1.  Anticoagulation responsible provider: Jens Som MD, Arlys John.  INR POC: 1.8.  Cuvette Lot#: [id number].  Exp: 03/2011.    Anticoagulation Management Assessment/Plan:      The patient's current anticoagulation dose is Warfarin sodium 6 mg tabs: Take as directed by coumadin clinic..  The target INR is 2 - 3.  The next INR is due 06/04/2010.  Anticoagulation instructions were given to patient.  Results were reviewed/authorized by Windell Hummingbird, RN.  She was notified by Windell Hummingbird, RN.         Prior Anticoagulation Instructions: INR 2.1 (goal 2-3)  Continue taking 1/2 tablet everyday except take 1 tablet on Mondays.  Recheck INR  in 4 weeks.  Current Anticoagulation Instructions: INR 1.8 Take 1 tablet today.  Then resume taking 1/2 tablet every day, except take 1 tablet on Mondays. Recheck in 3 weeks.

## 2010-05-27 ENCOUNTER — Other Ambulatory Visit: Payer: Self-pay | Admitting: *Deleted

## 2010-05-27 ENCOUNTER — Encounter: Payer: Self-pay | Admitting: Internal Medicine

## 2010-05-27 MED ORDER — HYDROCODONE-ACETAMINOPHEN 10-325 MG PO TABS: 1.0000 | ORAL_TABLET | Freq: Four times a day (QID) | ORAL | Status: DC | PRN

## 2010-05-28 ENCOUNTER — Encounter: Payer: Self-pay | Admitting: Internal Medicine

## 2010-05-28 ENCOUNTER — Ambulatory Visit (INDEPENDENT_AMBULATORY_CARE_PROVIDER_SITE_OTHER): Payer: Medicare Other | Admitting: Internal Medicine

## 2010-05-28 VITALS — BP 120/70 | HR 76 | Temp 98.2°F | Resp 16 | Ht 61.0 in | Wt 222.0 lb

## 2010-05-28 DIAGNOSIS — E876 Hypokalemia: Secondary | ICD-10-CM

## 2010-05-28 DIAGNOSIS — I881 Chronic lymphadenitis, except mesenteric: Secondary | ICD-10-CM

## 2010-05-28 DIAGNOSIS — Z7901 Long term (current) use of anticoagulants: Secondary | ICD-10-CM

## 2010-05-28 DIAGNOSIS — E785 Hyperlipidemia, unspecified: Secondary | ICD-10-CM

## 2010-05-28 DIAGNOSIS — E1059 Type 1 diabetes mellitus with other circulatory complications: Secondary | ICD-10-CM

## 2010-05-28 DIAGNOSIS — I10 Essential (primary) hypertension: Secondary | ICD-10-CM

## 2010-05-28 NOTE — Progress Notes (Signed)
Subjective:    Patient ID: Kristen [name], female    DOB: 12/28/1944, 65 y.o.   MRN: [id number]  HPI  patient is 65 year old African [place] female who presents for followup of hypertension coronary disease atrial fibrillation hyperlipidemia depression and as a acute complaint of worsening lymphedema in her left upper extremity.  Since she had shoulder summary surgery she has continued perception of swelling in her left upper extremity arterial E. She appears intact with good pulses no evidence for arterial ischemia or any venous blockage . She hassoft tissue lymphedema and diffuse tenderness.  Arm measurements do show a difference in circumference I suspect that she has some lymphatic blockage following surgery.   Her depression is moderately well-controlled she is dealing with the loss of her grandson and with her home stressors    Review of Systems  Constitutional: Negative for activity change, appetite change and fatigue.  HENT: Negative for ear pain, congestion, neck pain, postnasal drip and sinus pressure.   Eyes: Negative for redness and visual disturbance.  Respiratory: Positive for shortness of breath. Negative for cough and wheezing.   Gastrointestinal: Negative for abdominal pain and abdominal distention.  Genitourinary: Negative for dysuria, frequency and menstrual problem.  Musculoskeletal: Positive for joint swelling and arthralgias. Negative for myalgias.  Skin: Negative for rash and wound.        Soft tissue lymphedema of the left upper extremity  Neurological: Negative for dizziness, weakness and headaches.  Hematological: Negative for adenopathy. Does not bruise/bleed easily.  Psychiatric/Behavioral: Negative for sleep disturbance and decreased concentration.   Past Medical History  Diagnosis Date  . CAD (coronary artery disease)   . Atrial fibrillation   . Hypertension   . Hyperlipidemia   . GERD (gastroesophageal reflux disease)   . Hypokalemia   .  Diabetes mellitus   . Depression   . Lumbar back pain   . LBBB (left bundle branch block)   . Headache   . Lymphadenitis   . Arthritis   . Bursitis    Past Surgical History  Procedure Date  . Angioplasty laminectomy  . Lumbar laminectomy   . Coronary artery bypass graft   . Abdominal hysterectomy   . Cholecystectomy   . Tonsillectomy     reports that she quit smoking about 9 years ago. She does not have any smokeless tobacco history on file. Her alcohol and drug histories not on file. family history includes Arthritis in an unspecified family member; Diabetes in an unspecified family member; Heart attack in her father; Heart disease in her father; Hypertension in an unspecified family member; Kidney disease in an unspecified family member; and Stroke in her mother and unspecified family member. Allergies  Allergen Reactions  . Codeine   . Doxycycline Hyclate   . Morphine   . Pentazocine Lactate   . Sulfonamide Derivatives        Objective:   Physical Exam Blood pressure 120/70, pulse 76, temperature 98.2 F (36.8 C), temperature source Oral, resp. rate 16, height 5\' 1"  (1.549 m), weight 222 lb (100.699 kg).     On physical examination she is a pleasant African American female in no apparent distress blood pressure is 120/70 as noted pulse is regular today HEENT refill is present equal round reactive to light and accommodation neck was supple without bruit lung fields are clear to auscultation and percussion heart examination revealed regular rate and rhythm her abdomen was soft protuberant there is some tenderness in the left lower quadrant extremity examination revealed lymphedema  of the left upper extremity normal right upper extremity equal grips neurologically intact location the case revealed the no edema no cyanosis, normal peripheral pulses bilaterally    Assessment & Plan:

## 2010-05-28 NOTE — Assessment & Plan Note (Signed)
Stable blood pressure 

## 2010-05-28 NOTE — Assessment & Plan Note (Signed)
In  Her left arm that she reports as painful, she has not tried a compression stocking This is related to prior shoulder surgery

## 2010-05-28 NOTE — Assessment & Plan Note (Signed)
Monitored for Coumadin diagnosis atrial fibrillation

## 2010-05-28 NOTE — Assessment & Plan Note (Signed)
The pt has gained weight Activity is limited due to knee pain She is on insulin This is a catch 22... The more insulin the more weight, the more weight the more insulin She needs to go to the nutritionist. This is complicated by the coumadin and her diet restraints

## 2010-05-28 NOTE — Assessment & Plan Note (Signed)
She will require a Bmet to monitor her potassium today

## 2010-05-28 NOTE — Patient Instructions (Signed)
Please check with your insurance company about where is the best place for [place] to order a compression sleeve for your left arm

## 2010-06-01 ENCOUNTER — Other Ambulatory Visit: Payer: Self-pay | Admitting: Internal Medicine

## 2010-06-01 ENCOUNTER — Ambulatory Visit
Admission: RE | Admit: 2010-06-01 | Discharge: 2010-06-01 | Disposition: A | Discharge status: Home / Self Care | Payer: Medicare Other | Source: Ambulatory Visit | Attending: Internal Medicine | Admitting: Internal Medicine

## 2010-06-01 DIAGNOSIS — Z1231 Encounter for screening mammogram for malignant neoplasm of breast: Secondary | ICD-10-CM

## 2010-06-01 DIAGNOSIS — N6310 Unspecified lump in the right breast, unspecified quadrant: Secondary | ICD-10-CM

## 2010-06-03 NOTE — Progress Notes (Signed)
Summary: SOB  Phone Note Call from Patient Call back at Home Phone [phone] Call back at Work Phone [phone]   Caller: Patient Reason for Call: Talk to Nurse Summary of Call: pt states she been sob for about four days now. no chest pain. she would like to talk to a nurse.  Initial call taken by: Roe Coombs,  May 24, 2010 2:30 PM  Follow-up for Phone Call        Left message at both numbers for pt to call back. Julieta Gutting, RN, BSN  May 24, 2010 4:42 PM  I spoke with the pt and she c/o SOB that is different than what she has previously experienced.  The pt denies CP.  The pt said she has episodes when she cannot catch her breath.  Last Sunday she went to her grandson's soccer game went up hill and got SOB.  The pt was lying in bed last night and could not catch her breath.  The pt got dressed this morning and was SOB.  The pt said she is gaining weight due to insulin.  The pt's weight has increased over the past few months since starting insulin (225 lbs now).  The pt also feels like SOB is related to anxiety.  The pt states that she has been more anxious than normal recently. The pt denies swelling in her lower extremities.  I spoke with the pt about trying to walk for exercise and making modifications in her diet. I instructed the pt to call the office if she had any change or worsening in symptoms.  Pt agreed with plan.  Follow-up by: Julieta Gutting, RN, BSN,  May 24, 2010 4:47 PM    New/Updated Medications: ALPRAZOLAM 1 MG TABS (ALPRAZOLAM) one by mouth three times a day as needed for grief

## 2010-06-03 NOTE — Assessment & Plan Note (Signed)
Summary: FOLLOW UP/NWS   Vital Signs:  Patient profile:   65 year old female Height:      61 inches (154.94 cm) Weight:      225 pounds (102.27 kg) BMI:     42.67 O2 Sat:      98 % on Room air Temp:     98.3 degrees F (36.83 degrees C) oral Pulse rate:   80 / minute Pulse rhythm:   irregular BP sitting:   114 / 70  (left arm) Cuff size:   large  Vitals Entered By: Brenton Grills CMA Duncan Dull) (May 21, 2010 2:16 PM)  O2 Flow:  Room air CC: Follow-up visit/aj Is Patient Diabetic? Yes   Primary Provider:  Stacie Glaze MD  CC:  Follow-up visit/aj.  History of Present Illness: no cbg record, but states cbg's vary from 90 (am) up to 200's (afternoon).  pt states she feels well in general.    Current Medications (verified): 1)  Toprol Xl 100 Mg Xr24h-Tab (Metoprolol Succinate) .Marland Kitchen.. 1 Once Daily 2)  Alprazolam 1 Mg Tabs (Alprazolam) .... One By Mouth Three Times A Day As Needed For Grief 3)  Hydrochlorothiazide 12.5 Mg  Tabs (Hydrochlorothiazide) .... Take 1 Tablet By Mouth Once A Day 4)  Spironolactone 25 Mg  Tabs (Spironolactone) .Marland Kitchen.. 1 Once Daily 5)  Hydrocodone-Acetaminophen 10-325 Mg Tabs (Hydrocodone-Acetaminophen) .... 1/2 To 1 By Mouth Q 6 Hours As Needed 6)  Micardis 40 Mg Tabs (Telmisartan) .Marland Kitchen.. 1 Once Daily or As Directed 7)  Prevacid 30 Mg  Cpdr (Lansoprazole) .... As Needed 8)  Byetta 10 Mcg Pen 10 Mcg/0.54ml Soln (Exenatide) .... One Injection Twice Daily 9)  Bd Insulin Syringe Half-Unit 31g X 5/16" 0.3 Ml Misc (Insulin Syringe-Needle U-100) .... 7 Times A Day 10)  Nitrostat 0.4 Mg  Subl (Nitroglycerin) .... Use As Directed 11)  Onetouch Ultra Test  Strp (Glucose Blood) .... Use 2 Times A Day 12)  Klor-Con M20 20 Meq Cr-Tabs (Potassium Chloride Crys Cr) .... Take One and One-Half Tablet Daily 13)  Lipitor 20 Mg Tabs (Atorvastatin Calcium) .Marland Kitchen.. 1 Once Daily 14)  Warfarin Sodium 6 Mg Tabs (Warfarin Sodium) .... Take As Directed By Coumadin Clinic. 15)  Digoxin 0.25  Mg Tabs (Digoxin) .... Take 1 By Mouth Once A Day 16)  Gabapentin 300 Mg Caps (Gabapentin) .Marland Kitchen.. 1 Three Times A Day 17)  Cyclobenzaprine Hcl 10 Mg Tabs (Cyclobenzaprine Hcl) .Marland Kitchen.. 1 Tablet By Mouth Two Times A Day 18)  Aspirin 81 Mg Tbec (Aspirin) .... Take One Tablet By Mouth Daily 19)  Fluconazole 150 Mg Tabs (Fluconazole) .Marland Kitchen.. 1 Today and 1 in 5 Days 20)  Novolog Flexpen 100 Unit/ml Soln (Insulin Aspart) .... 50 Units Three Times A Day (Just Before Each Meal), and Pen Needles 5/day  Allergies (verified): 1)  ! Sulfa 2)  ! Morphine 3)  ! Talwin 4)  ! Codeine 5)  ! Vibramycin  Past History:  Past Medical History: Last updated: 07/15/2009 Current Problems:  CORONARY ARTERY DISEASE (ICD-414.00) ATRIAL FIBRILLATION (ICD-427.31) ANTICOAGULATION RX (ICD-V58.61) HYPERTENSION (ICD-401.9) HYPERLIPIDEMIA NEC/NOS (ICD-272.4) GERD (ICD-530.81) HYPOKALEMIA (ICD-276.8) DIABETES MELLITUS, TYPE II (ICD-250.00) DEPRESSION (ICD-311) GRIEF REACTION, ACUTE (ICD-309.0) ACUTE BRONCHITIS (ICD-466.0) BACK PAIN, LUMBAR, WITH RADICULOPATHY (ICD-724.4) LBBB (ICD-426.3) HEADACHE (ICD-784.0) KNEE PAIN, RIGHT (ICD-719.46) COSTOCHONDRITIS, ACUTE (ICD-733.6) CHRONIC LYMPHADENITIS (ICD-289.1) URINARY FREQUENCY (ICD-788.41) OSTEOARTHRITIS (ICD-715.90) BURSITIS, ACROMIOCLAVICULAR, LEFT (ICD-726.10) FAMILY HISTORY DIABETES 1ST DEGREE RELATIVE (ICD-V18.0)  Review of Systems  The patient denies hypoglycemia.    Physical Exam  General:  morbidly obese.  no distress. Pulses:  dorsalis pedis intact bilat.   Extremities:  no deformity.  no ulcer on the feet.  feet are of normal color and temp.  no edema  Neurologic:  sensation is intact to touch on the feet   Impression & Recommendations:  Problem # 1:  DIAB W/PERIPH CIRC D/O TYPE I [JUV TYPE] UNCNTRL (ICD-250.73) needs increased rx  Other Orders: Est. Patient Level III (16606)  Patient Instructions: 1)  check your blood sugar 2 times a day.   vary the time of day when you check, between before the 3 meals, and at bedtime.  also check if you have symptoms of your blood sugar being too high or too low.  please keep a record of the readings and bring it to your next appointment here.  please call us sooner if you are having low blood sugar episodes. 2)  increase novolog to three times a day (just before each meal) 50-55-45 units. 3)  Please schedule a follow-up appointment in 6 weeks.   Orders Added: 1)  Est. Patient Level III [30160]

## 2010-06-04 ENCOUNTER — Ambulatory Visit
Admission: RE | Admit: 2010-06-04 | Discharge: 2010-06-04 | Disposition: A | Discharge status: Home / Self Care | Payer: Medicare Other | Source: Ambulatory Visit | Attending: Internal Medicine | Admitting: Internal Medicine

## 2010-06-04 ENCOUNTER — Encounter: Payer: Medicare Other | Admitting: *Deleted

## 2010-06-04 ENCOUNTER — Other Ambulatory Visit: Payer: Self-pay | Admitting: Internal Medicine

## 2010-06-04 DIAGNOSIS — N6310 Unspecified lump in the right breast, unspecified quadrant: Secondary | ICD-10-CM

## 2010-06-10 ENCOUNTER — Encounter: Payer: Medicare Other | Admitting: *Deleted

## 2010-06-14 ENCOUNTER — Encounter: Payer: Self-pay | Admitting: Family Medicine

## 2010-06-14 ENCOUNTER — Ambulatory Visit (INDEPENDENT_AMBULATORY_CARE_PROVIDER_SITE_OTHER): Payer: Medicare Other | Admitting: Family Medicine

## 2010-06-14 ENCOUNTER — Other Ambulatory Visit: Payer: Self-pay | Admitting: Internal Medicine

## 2010-06-14 VITALS — BP 160/82 | Temp 98.4°F

## 2010-06-14 DIAGNOSIS — M79609 Pain in unspecified limb: Secondary | ICD-10-CM

## 2010-06-14 DIAGNOSIS — M79659 Pain in unspecified thigh: Secondary | ICD-10-CM

## 2010-06-14 LAB — POCT I-STAT, CHEM 8
BUN: 10 mg/dL (ref 6–23)
Chloride: 104 mEq/L (ref 96–112)
Creatinine, Ser: 0.9 mg/dL (ref 0.4–1.2)
Glucose, Bld: 80 mg/dL (ref 70–99)
Potassium: 3.5 mEq/L (ref 3.5–5.1)

## 2010-06-14 LAB — POCT CARDIAC MARKERS
CKMB, poc: <1 ng/mL — ABNORMAL LOW (ref 1.0–8.0)
Myoglobin, poc: 64.1 ng/mL (ref 12–200)

## 2010-06-14 NOTE — Progress Notes (Signed)
  Subjective:    Patient ID: Kristen [name], female    DOB: 12/28/1944, 65 y.o.   MRN: [id number]  HPI Patient seen with poorly localized left lower extremity pain. Pain actually radiates somewhat from the left posterior thigh and hip area toward the knee. She's had multiple back surgeries previously. Denies any numbness or weakness. No incontinence symptoms. Pain is moderate.  Sharp quality. Question some mild edema left lower extremity past few days.  She has no history of DVT. Takes Coumadin for atrial fibrillation and INR has been relatively therapeutic. Her symptoms are worse after walking and prolonged standing. No locking or giving way of the knee. Poorly localized pain around the knee region.   Review of Systems  Constitutional: Negative for fever, chills and activity change.  Respiratory: Negative for cough and shortness of breath.   Cardiovascular: Negative for chest pain and palpitations.  Skin: Negative for rash.  Neurological: Negative for weakness.  Hematological: Negative for adenopathy. Does not bruise/bleed easily.       Objective:   Physical Exam  Constitutional: She is oriented to person, place, and time. She appears well-developed and well-nourished.  Cardiovascular: Normal rate.   Pulmonary/Chest: Breath sounds normal. She has no wheezes. She has no rales.  Musculoskeletal: She exhibits no edema and no tenderness.       Left knee reveals no effusion. Full range of motion. She has some mild medial joint line tenderness. No lateral joint line tenderness. Ligament testing is normal.  Neurological: She is alert and oriented to person, place, and time.       No focal strength deficits lower extremities.  Normal sensory function legs and feet.          Assessment & Plan:  Poorly localized left lower exremity pain. Differential includes radiculopathy though location is more post thigh than post-lateral. No evidence for DVT and would be very unlikely on Coumadin.   ?posterior meniscal but this would not cause any radiation up thigh.  We have recommended that she continue with hydrocodone prn.  X-rays of knee if no better in 1-2 weeks.

## 2010-06-15 LAB — URINALYSIS, ROUTINE W REFLEX MICROSCOPIC
Bilirubin Urine: NEGATIVE
Nitrite: NEGATIVE
Specific Gravity, Urine: 1.005 (ref 1.005–1.030)
Urobilinogen, UA: 1 mg/dL (ref 0.0–1.0)

## 2010-06-15 LAB — PROTIME-INR: INR: 1.6 — ABNORMAL HIGH (ref 0.00–1.49)

## 2010-06-15 LAB — DIFFERENTIAL
Lymphocytes Relative: 53 % — ABNORMAL HIGH (ref 12–46)
Lymphs Abs: 3.5 10*3/uL (ref 0.7–4.0)
Monocytes Relative: 10 % (ref 3–12)
Neutro Abs: 2.3 10*3/uL (ref 1.7–7.7)
Neutrophils Relative %: 35 % — ABNORMAL LOW (ref 43–77)

## 2010-06-15 LAB — CBC
RBC: 4.34 MIL/uL (ref 3.87–5.11)
WBC: 6.6 10*3/uL (ref 4.0–10.5)

## 2010-06-15 LAB — URINE CULTURE

## 2010-06-15 LAB — APTT: aPTT: 32 seconds (ref 24–37)

## 2010-06-16 ENCOUNTER — Ambulatory Visit: Payer: Medicare Other | Admitting: Dietician

## 2010-06-17 LAB — URINALYSIS, ROUTINE W REFLEX MICROSCOPIC
Bilirubin Urine: NEGATIVE
Glucose, UA: NEGATIVE mg/dL
Hgb urine dipstick: NEGATIVE
Specific Gravity, Urine: 1.011 (ref 1.005–1.030)
pH: 6 (ref 5.0–8.0)

## 2010-06-17 LAB — CBC
Hemoglobin: 14.1 g/dL (ref 12.0–15.0)
MCHC: 34.3 g/dL (ref 30.0–36.0)
RBC: 4.35 MIL/uL (ref 3.87–5.11)

## 2010-06-17 LAB — POCT I-STAT, CHEM 8
Chloride: 104 mEq/L (ref 96–112)
Creatinine, Ser: 0.7 mg/dL (ref 0.4–1.2)
Glucose, Bld: 152 mg/dL — ABNORMAL HIGH (ref 70–99)
Hemoglobin: 15 g/dL (ref 12.0–15.0)
Potassium: 3.9 mEq/L (ref 3.5–5.1)

## 2010-06-17 LAB — GLUCOSE, CAPILLARY: Glucose-Capillary: 208 mg/dL — ABNORMAL HIGH (ref 70–99)

## 2010-06-18 ENCOUNTER — Encounter: Payer: Medicare Other | Admitting: *Deleted

## 2010-06-21 LAB — URINALYSIS, ROUTINE W REFLEX MICROSCOPIC
Bilirubin Urine: NEGATIVE
Hgb urine dipstick: NEGATIVE
Nitrite: NEGATIVE
Specific Gravity, Urine: 1.017 (ref 1.005–1.030)
pH: 5.5 (ref 5.0–8.0)

## 2010-06-21 LAB — CBC
HCT: 42.3 % (ref 36.0–46.0)
Hemoglobin: 14 g/dL (ref 12.0–15.0)
MCHC: 33.2 g/dL (ref 30.0–36.0)
MCV: 95.3 fL (ref 78.0–100.0)
RBC: 4.44 MIL/uL (ref 3.87–5.11)

## 2010-06-21 LAB — BASIC METABOLIC PANEL
BUN: 15 mg/dL (ref 6–23)
Calcium: 8.7 mg/dL (ref 8.4–10.5)
GFR calc non Af Amer: >60 mL/min (ref >60)
Glucose, Bld: 117 mg/dL — ABNORMAL HIGH (ref 70–99)
Potassium: 4.1 mEq/L (ref 3.5–5.1)

## 2010-06-21 LAB — POCT I-STAT, CHEM 8
Chloride: 104 mEq/L (ref 96–112)
Creatinine, Ser: 0.9 mg/dL (ref 0.4–1.2)
Glucose, Bld: 118 mg/dL — ABNORMAL HIGH (ref 70–99)
HCT: 44 % (ref 36.0–46.0)
Potassium: 3.1 mEq/L — ABNORMAL LOW (ref 3.5–5.1)
Sodium: 138 mEq/L (ref 135–145)

## 2010-06-21 LAB — POCT CARDIAC MARKERS
CKMB, poc: <1 ng/mL — ABNORMAL LOW (ref 1.0–8.0)
Myoglobin, poc: 91.2 ng/mL (ref 12–200)
Troponin i, poc: <0.05 ng/mL (ref 0.00–0.09)

## 2010-06-21 LAB — GLUCOSE, CAPILLARY
Glucose-Capillary: 112 mg/dL — ABNORMAL HIGH (ref 70–99)
Glucose-Capillary: 124 mg/dL — ABNORMAL HIGH (ref 70–99)

## 2010-06-21 LAB — DIFFERENTIAL
Basophils Relative: 1 % (ref 0–1)
Eosinophils Absolute: 0.2 10*3/uL (ref 0.0–0.7)
Monocytes Absolute: 0.5 10*3/uL (ref 0.1–1.0)
Monocytes Relative: 8 % (ref 3–12)

## 2010-06-28 ENCOUNTER — Encounter: Payer: Self-pay | Admitting: Cardiology

## 2010-06-28 ENCOUNTER — Ambulatory Visit: Payer: Medicare Other | Admitting: Cardiology

## 2010-06-28 ENCOUNTER — Telehealth: Payer: Self-pay | Admitting: Cardiology

## 2010-06-28 ENCOUNTER — Ambulatory Visit (INDEPENDENT_AMBULATORY_CARE_PROVIDER_SITE_OTHER): Payer: Medicare Other | Admitting: Cardiology

## 2010-06-28 DIAGNOSIS — I447 Left bundle-branch block, unspecified: Secondary | ICD-10-CM

## 2010-06-28 DIAGNOSIS — I2510 Atherosclerotic heart disease of native coronary artery without angina pectoris: Secondary | ICD-10-CM

## 2010-06-28 DIAGNOSIS — F4321 Adjustment disorder with depressed mood: Secondary | ICD-10-CM

## 2010-06-28 DIAGNOSIS — I10 Essential (primary) hypertension: Secondary | ICD-10-CM

## 2010-06-28 DIAGNOSIS — I4891 Unspecified atrial fibrillation: Secondary | ICD-10-CM

## 2010-06-28 DIAGNOSIS — E7800 Pure hypercholesterolemia, unspecified: Secondary | ICD-10-CM

## 2010-06-28 NOTE — Patient Instructions (Signed)
Your physician recommends that you continue on your current medications as directed. Please refer to the Current Medication list given to you today.  You have been referred to Cardiac Rehabilitation--Maintenance Program  Your physician wants you to follow-up in: 6 MONTHS.  You will receive a reminder letter in the mail two months in advance. If you don't receive a letter, please call our office to schedule the follow-up appointment.

## 2010-06-28 NOTE — Telephone Encounter (Signed)
I spoke with the pt and made her aware that the only way to evaluate her SOB is to see her in the office.  I made the pt aware that she can complete financial assistance paperwork if needed.  The pt would like to come into the office this afternoon and be seen by Dr Riley Kill.  I will add the pt back on his schedule.

## 2010-06-30 ENCOUNTER — Ambulatory Visit: Payer: Medicare Other | Admitting: Dietician

## 2010-06-30 NOTE — Assessment & Plan Note (Signed)
Chronic without change.

## 2010-06-30 NOTE — Assessment & Plan Note (Signed)
Not having much in the way of chest pain.  She is probably stable.  Has some shortness of breath, but this is likely multifactorial.  Rehab, in a graduate program, would be of help.

## 2010-06-30 NOTE — Assessment & Plan Note (Signed)
No present evidence of this.

## 2010-06-30 NOTE — Progress Notes (Signed)
HPI:  Kristen [name] is in for a follow up visit.  She is stable, but is miserable and disappointed.  She is not doing much.  Her husband left her, which has been both "good and bad". They were not communicating, and yet, she is lonely.  She still has not recovered from the loss of her grandson, now in excess of one year, and she understands this is a barrier at present to successful relationships.  She is more short of breath, and has gained some weight.  Understands the dynamics.    Current Outpatient Prescriptions  Medication Sig Dispense Refill  . ALPRAZolam (XANAX) 1 MG tablet Take 1 mg by mouth 3 (three) times daily.       Marland Kitchen aspirin 81 MG tablet Take 81 mg by mouth daily.        Marland Kitchen atorvastatin (LIPITOR) 20 MG tablet Take 20 mg by mouth daily.        . cyclobenzaprine (FLEXERIL) 10 MG tablet Take 10 mg by mouth 2 (two) times daily as needed.        . digoxin (LANOXIN) 0.25 MG tablet Take 250 mcg by mouth daily.        . Exenatide (BYETTA 10 MCG PEN [place]) Inject into the skin 2 (two) times daily.        Marland Kitchen gabapentin (NEURONTIN) 300 MG capsule Take 300 mg by mouth 3 (three) times daily.        Marland Kitchen glucose blood (ONE TOUCH TEST STRIPS) test strip 1 each by Other route 2 (two) times daily. Use as instructed       . hydrochlorothiazide (,MICROZIDE/HYDRODIURIL,) 12.5 MG capsule Take 12.5 mg by mouth daily.        Marland Kitchen HYDROcodone-acetaminophen (NORCO) 10-325 MG per tablet Take 1 tablet by mouth every 6 (six) hours as needed.        . insulin aspart (NOVOLOG) 100 UNIT/ML injection Inject 50 Units into the skin 3 (three) times daily before meals.        Marland Kitchen KLOR-CON M20 20 MEQ tablet TAKE 1 & 1/2 TABLETS BY MOUTH ONCE A DAY  60 tablet  5  . lansoprazole (PREVACID) 30 MG capsule Take 30 mg by mouth as needed.       . metoprolol (TOPROL-XL) 100 MG 24 hr tablet Take 100 mg by mouth daily.        . nitroGLYCERIN (NITROSTAT) 0.4 MG SL tablet Place 0.4 mg under the tongue every 5 (five) minutes as needed.        Marland Kitchen  spironolactone (ALDACTONE) 25 MG tablet Take 25 mg by mouth daily.        Marland Kitchen telmisartan (MICARDIS) 40 MG tablet Take 40 mg by mouth daily.        Marland Kitchen warfarin (COUMADIN) 6 MG tablet Take 6 mg by mouth as directed.          Allergies  Allergen Reactions  . Codeine   . Doxycycline Hyclate   . Morphine   . Pentazocine Lactate   . Sulfonamide Derivatives     Past Medical History  Diagnosis Date  . CAD (coronary artery disease)   . Atrial fibrillation   . Hypertension   . Hyperlipidemia   . GERD (gastroesophageal reflux disease)   . Hypokalemia   . Diabetes mellitus   . Depression   . Lumbar back pain   . LBBB (left bundle branch block)   . Headache   . Lymphadenitis   . Arthritis   . Bursitis  Past Surgical History  Procedure Date  . Angioplasty laminectomy  . Lumbar laminectomy   . Coronary artery bypass graft   . Abdominal hysterectomy   . Cholecystectomy   . Tonsillectomy     Family History  Problem Relation Age of Onset  . Heart attack Father   . Heart disease Father   . Kidney disease    . Stroke    . Arthritis    . Hypertension    . Diabetes    . Stroke Mother     History   Social History  . Marital Status: Legally Separated    Spouse Name: N/A    Number of Children: N/A  . Years of Education: N/A   Occupational History  . Not on file.   Social History Main Topics  . Smoking status: Former Smoker    Quit date: 03/07/2001  . Smokeless tobacco: Not on file  . Alcohol Use: Not on file  . Drug Use: Not on file  . Sexually Active: Not on file   Other Topics Concern  . Not on file   Social History Narrative  . No narrative on file    ROS: Please see the HPI.  All other systems reviewed and negative.  PHYSICAL EXAM:  BP 122/70  Pulse 68  Resp 18  Ht 5\' 1"  (1.549 m)  Wt 226 lb (102.513 kg)  BMI 42.70 kg/m2  General: Well developed, well nourished, in no acute distress. Head:  Normocephalic and atraumatic. Neck: no JVD Lungs:  Clear to auscultation and percussion. Heart: Normal S1 and paradoxical S2.  PMI non displaced. Prom S4 gallop. Abdomen:  Normal bowel sounds; soft; non tender; no organomegaly Pulses: Pulses normal in all 4 extremities. Extremities: No clubbing or cyanosis. No edema. Neurologic: Alert and oriented x 3.  EKG:  NSR.  LBBB.  ASSESSMENT AND PLAN:

## 2010-06-30 NOTE — Assessment & Plan Note (Signed)
Seems a bit excessive at this point.  Counseling recommended.

## 2010-06-30 NOTE — Assessment & Plan Note (Signed)
She is currently controlled at present.

## 2010-07-01 ENCOUNTER — Encounter: Payer: Medicare Other | Attending: Internal Medicine | Admitting: Dietician

## 2010-07-02 ENCOUNTER — Other Ambulatory Visit: Payer: Self-pay | Admitting: Internal Medicine

## 2010-07-02 ENCOUNTER — Telehealth: Payer: Self-pay | Admitting: Internal Medicine

## 2010-07-02 ENCOUNTER — Ambulatory Visit: Payer: Medicare Other | Admitting: Endocrinology

## 2010-07-02 NOTE — Telephone Encounter (Signed)
Pt is out of her needles (do not know what the name is), and would like a refill called to [facility]. She would like them called in today.

## 2010-07-02 NOTE — Telephone Encounter (Signed)
It had already been refilled earlier this am

## 2010-07-07 ENCOUNTER — Telehealth: Payer: Self-pay | Admitting: *Deleted

## 2010-07-07 DIAGNOSIS — M79603 Pain in arm, unspecified: Secondary | ICD-10-CM

## 2010-07-07 NOTE — Telephone Encounter (Signed)
By protocol, she has to have plain xrays first- can have xray of c spine per drjenkjns

## 2010-07-07 NOTE — Telephone Encounter (Signed)
Pt is having an extreme amount of pain from neck down arm with swelling in the arm.  Her friend is a Engineer, civil (consulting) and thinks she has aC-4 disc rupture.  Would like Dr. Lovell Sheehan to order an CT or MRI of her neck.

## 2010-07-08 NOTE — Telephone Encounter (Signed)
Pt agrees to have the Cspine tomorrow.  Xrays ordered.

## 2010-07-13 ENCOUNTER — Other Ambulatory Visit: Payer: Self-pay | Admitting: Internal Medicine

## 2010-07-15 ENCOUNTER — Telehealth: Payer: Self-pay | Admitting: Cardiology

## 2010-07-15 NOTE — Telephone Encounter (Signed)
Pt calling to inquire about why she has not heard anything from cardiac rehab

## 2010-07-15 NOTE — Telephone Encounter (Signed)
I spoke with cardiac rehab and the nurse who handles the maintenance program is off today.  I did fax an order to cardiac rehab following the pt's office visit.  I will refax order for maintenance program.  I left a detailed message on the pt's voicemail with this information.

## 2010-07-16 ENCOUNTER — Encounter: Payer: Self-pay | Admitting: Endocrinology

## 2010-07-16 ENCOUNTER — Ambulatory Visit (INDEPENDENT_AMBULATORY_CARE_PROVIDER_SITE_OTHER): Payer: Medicare Other | Admitting: Endocrinology

## 2010-07-16 ENCOUNTER — Ambulatory Visit (INDEPENDENT_AMBULATORY_CARE_PROVIDER_SITE_OTHER)
Admission: RE | Admit: 2010-07-16 | Discharge: 2010-07-16 | Disposition: A | Discharge status: Home / Self Care | Payer: Medicare Other | Source: Ambulatory Visit | Attending: Internal Medicine | Admitting: Internal Medicine

## 2010-07-16 VITALS — BP 122/76 | HR 73 | Temp 98.4°F | Ht 61.0 in | Wt 227.8 lb

## 2010-07-16 DIAGNOSIS — E1059 Type 1 diabetes mellitus with other circulatory complications: Secondary | ICD-10-CM

## 2010-07-16 DIAGNOSIS — M79609 Pain in unspecified limb: Secondary | ICD-10-CM

## 2010-07-16 DIAGNOSIS — M79603 Pain in arm, unspecified: Secondary | ICD-10-CM

## 2010-07-16 NOTE — Progress Notes (Signed)
Subjective:    Patient ID: Kristen [name], female    DOB: 12/28/1944, 65 y.o.   MRN: [id number]  HPI pt states she feels well in general, except for persistent left shoulder pain.   no cbg record, but states cbg was 71, after the evening meal, a few days ago.  It is also highest at hs, or beore evening meal. Past Medical History  Diagnosis Date  . CAD (coronary artery disease)   . Atrial fibrillation   . Hypertension   . Hyperlipidemia   . GERD (gastroesophageal reflux disease)   . Hypokalemia   . Diabetes mellitus   . Depression   . Lumbar back pain   . LBBB (left bundle branch block)   . Headache   . Lymphadenitis   . Arthritis   . Bursitis     Past Surgical History  Procedure Date  . Angioplasty laminectomy  . Lumbar laminectomy   . Coronary artery bypass graft   . Abdominal hysterectomy   . Cholecystectomy   . Tonsillectomy     History   Social History  . Marital Status: Legally Separated    Spouse Name: N/A    Number of Children: N/A  . Years of Education: N/A   Occupational History  . Not on file.   Social History Main Topics  . Smoking status: Former Smoker    Quit date: 03/07/2001  . Smokeless tobacco: Not on file  . Alcohol Use: Not on file  . Drug Use: Not on file  . Sexually Active: Not on file   Other Topics Concern  . Not on file   Social History Narrative  . No narrative on file    Current Outpatient Prescriptions on File Prior to Visit  Medication Sig Dispense Refill  . ALPRAZolam (XANAX) 1 MG tablet Take 1 mg by mouth 3 (three) times daily.       Marland Kitchen aspirin 81 MG tablet Take 81 mg by mouth daily.        Marland Kitchen atorvastatin (LIPITOR) 20 MG tablet Take 20 mg by mouth daily.        . B-D ULTRAFINE III SHORT PEN 31G X 8 MM MISC USE 7 TIMES A DAY  200 each  1  . cyclobenzaprine (FLEXERIL) 10 MG tablet Take 10 mg by mouth 2 (two) times daily as needed.        . digoxin (LANOXIN) 0.25 MG tablet Take 250 mcg by mouth daily.        .  Exenatide (BYETTA 10 MCG PEN [place]) Inject into the skin 2 (two) times daily.        Marland Kitchen gabapentin (NEURONTIN) 300 MG capsule Take 300 mg by mouth 3 (three) times daily.        Marland Kitchen glucose blood (ONE TOUCH TEST STRIPS) test strip 1 each by Other route 2 (two) times daily. Use as instructed       . hydrochlorothiazide (,MICROZIDE/HYDRODIURIL,) 12.5 MG capsule TAKE ONE CAPSULE BY MOUTH EVERY DAY  90 capsule  3  . HYDROcodone-acetaminophen (NORCO) 10-325 MG per tablet Take 1 tablet by mouth every 6 (six) hours as needed.        . insulin aspart (NOVOLOG) 100 UNIT/ML injection Inject into the skin 3 (three) times daily before meals. 3x a day (just before each meal) 50-55-40 units      . KLOR-CON M20 20 MEQ tablet TAKE 1 & 1/2 TABLETS BY MOUTH ONCE A DAY  60 tablet  5  . lansoprazole (PREVACID) 30  MG capsule Take 30 mg by mouth as needed.       . metoprolol (TOPROL-XL) 100 MG 24 hr tablet TAKE 1 TABLET IN THE MORNING AND 1/2 TABLET INTHE EVENING  45 tablet  5  . nitroGLYCERIN (NITROSTAT) 0.4 MG SL tablet Place 0.4 mg under the tongue every 5 (five) minutes as needed.        Marland Kitchen spironolactone (ALDACTONE) 25 MG tablet Take 25 mg by mouth daily.        Marland Kitchen telmisartan (MICARDIS) 40 MG tablet Take 40 mg by mouth daily.        Marland Kitchen warfarin (COUMADIN) 6 MG tablet Take 6 mg by mouth as directed.          Allergies  Allergen Reactions  . Codeine   . Doxycycline Hyclate   . Morphine   . Pentazocine Lactate   . Sulfonamide Derivatives     Family History  Problem Relation Age of Onset  . Heart attack Father   . Heart disease Father   . Kidney disease    . Stroke    . Arthritis    . Hypertension    . Diabetes    . Stroke Mother     BP 122/76  Pulse 73  Temp(Src) 98.4 F (36.9 C) (Oral)  Ht 5\' 1"  (1.549 m)  Wt 227 lb 12.8 oz (103.329 kg)  BMI 43.04 kg/m2  SpO2 95%    Review of Systems denies hypoglycemia    Objective:   Physical Exam SKIN: Insulin injection sites at the anterior abdomen are  normal   Assessment & Plan:  Dm, apparently overcontrolled

## 2010-07-16 NOTE — Patient Instructions (Addendum)
decrease novolog to three times a day (just before each meal) 50-55-40 units. check your blood sugar 2 times a day.  vary the time of day when you check, between before the 3 meals, and at bedtime.  also check if you have symptoms of your blood sugar being too high or too low.  please keep a record of the readings and bring it to your next appointment here.  please call us sooner if you are having low blood sugar episodes. good diet and exercise habits significanly improve the control of your diabetes.  please let me know if you wish to be referred to a dietician.  high blood sugar is very risky to your health.  you should see an eye doctor every year. controlling your blood pressure and cholesterol drastically reduces the damage diabetes does to your body.  this also applies to quitting smoking.  please discuss these with your doctor.  you should take an aspirin every day, unless you have been advised by a doctor not to. Please make a follow-up appointment in 2 months.

## 2010-07-20 ENCOUNTER — Telehealth: Payer: Self-pay | Admitting: Cardiology

## 2010-07-20 ENCOUNTER — Telehealth: Payer: Self-pay | Admitting: *Deleted

## 2010-07-20 NOTE — Assessment & Plan Note (Signed)
[facility]                            CARDIOLOGY OFFICE NOTE   Kristen, [name]                 MRN:          [id number]  DATE:03/19/2008                            DOB:          12/28/1944    Kristen [name] is in for a followup visit.  In general, she has continued to  have intermittent episodes of chest discomfort.  It does seem to have  some relief with nitroglycerin.  She had also has some shortness of  breath.  However, the episodes are not clearly related in any way to  exertion.  They are also located under the left inframammary region.   MEDICATIONS:  1. Enteric-coated aspirin 81 mg daily.  2. Micardis 20 mg daily.  3. Coumadin as directed.  4. Lipitor 20 mg daily.  5. Prevacid 30 mg daily.  6. Byetta 10 units b.i.d.  7. Xanax 0.5 q.i.d.  8. Amaryl 4 mg b.i.d.  9. Levemir 20 units b.i.d.  10.Hydrochlorothiazide 12.5 daily.  11.Lanoxin 0.25 daily.  12.NovoLog.  13.Potassium 20 mEq daily.  14.Toprol-XL 100 mg daily.  15.Spironolactone 25 daily.   PHYSICAL EXAMINATION:  GENERAL:  She is alert and oriented.  VITAL SIGNS:  The weight is 215 pounds.  Blood pressure is 156/88, pulse  is 68.  CARDIAC:  Rhythm is regular.  There is a paradoxical splitting of the  second heart sound.  EXTREMITIES:  Do not reveal significant edema.   Her EKGs have consistently demonstrated sinus rhythm with left bundle-  branch block.   I had a thorough discussion today.  She continues to have symptoms, but  they clearly have some atypical features.  I am not entirely convinced  this is cardiac, but on the other hand, we cannot exclude that.  Question is whether or not we should consider repeat cardiac  catheterization.  I have talked with her in some detail about all of  this.  She has a known patent internal mammary to the distal LAD.  Her  most recent echocardiogram demonstrates an EF in the 40% range.  If she  is agreeable, my leaning would be  to try to get a definitive answer for  her.  Given the frequency of the symptoms, the fact that she is diabetic  with underlying left bundle.  She is agreeable to this.  She is going to go home and think about this,  but likely will be agreeable.  She will call us back in the next few  days to let us know.     Arturo Morton. Riley Kill, MD, [facility]  Electronically Signed    TDS/MedQ  DD: 03/28/2008  DT: 03/29/2008  Job #: [id number]

## 2010-07-20 NOTE — H&P (Signed)
NAME:  Kristen [name], Kristen [name] NO.:  [id number]   MEDICAL RECORD NO.:  [id number]          PATIENT TYPE:  INP   LOCATION:  2041                         FACILITY:  [facility]   PHYSICIAN:  Arturo Morton. Riley Kill, MD, FACCDATE OF BIRTH:  12/28/1944   DATE OF ADMISSION:  01/27/2008  DATE OF DISCHARGE:  01/29/2008                              HISTORY & PHYSICAL   CHIEF COMPLAINT:  Chest pain.   HISTORY OF PRESENT ILLNESS:  [name] is a pleasant woman well  known to me.  She previously underwent coronary artery bypass graft  surgery.  She has been having recurrent episodes of chest discomfort,  including recent hospitalization for the symptoms.  As a result, we have  been following her up.  She continues to have some symptoms recently.  They are somewhat atypical.  They are relieved by nitroglycerin, but  they occur in the left inframammary area, but not clearly related to  exertion.  She is being admitted now for diagnostic cardiac  catheterization.  Importantly, the patient has chronic left-bundle  branch block which has made reading the EKG nearly impossible.   PAST MEDICAL HISTORY:  1. Hypertension.  2. Multiple back surgeries.  3. Hysterectomy.  4. Laparoscopic cholecystectomy.  5. History of hypokalemia.  6. Chronic left bundle-branch block.  7. Prior smoking history.  8. Insulin-dependent diabetes mellitus.   ALLERGIES:  SULFA.   MEDICATIONS:  1. Enteric-coated aspirin 81 mg daily.  2. Micardis 20 mg daily.  3. Coumadin as directed.  4. Lipitor 20 mg daily.  5. Prevacid 30 mg daily.  6. Byetta 10 units b.i.d.  7. Xanax 0.5 q.i.d.  8. Amaryl 4 mg b.i.d.  9. Levemir 20 units b.i.d.  10.Hydrochlorothiazide 12.5 daily.  11.Lanoxin 0.25 daily.  12.NovoLog.  13.Potassium 20 mEq daily.  14.Toprol-XL 100 mg daily.  15.Spironolactone 25 mg daily.   FAMILY HISTORY:  Her father died an MI at 51 and her mother died of CVA  at 78.  She had a sister who died of  renal cancer at 94.   The patient previously worked at [employer].  She is currently  on disability.  She has 2 children.  She quit smoking in 2003.   REVIEW OF SYSTEMS:  She has not had blood in her stools.  She has not  vomited blood.  She has not had significant cough.  Review of systems is  otherwise unremarkable.   On physical examination, she is alert and oriented.  The weight is 215  pounds.  Blood pressure 156/80, pulse 68.  Carotid upstrokes are brisk.  Lung fields are clear to auscultation and percussion.  PMI is  nondisplaced.  There is a paradoxical splitting of the second heart  sound, questionable tenderness in the left inframammary region.  Abdomen  is soft.  Extremities, no edema.   EKG:  Sinus rhythm with left bundle-branch block.   Echocardiogram demonstrates normal LV size with hypokinesis of the  anteroseptum.  EF estimated in the range of 40%.   IMPRESSION:  1. Coronary disease with prior coronary artery bypass graft surgery      with an internal  mammary to the left anterior descending artery by      Dr. Evelene Croon.  2. Hypertension.  3. Recurrent chest discomfort.  4. Other problems as outlined.  Risks, benefits and alternatives have      been discussed with the patient.  She is agreeable to proceed.      Arturo Morton. Riley Kill, MD, [facility]  Electronically Signed     TDS/MEDQ  D:  03/28/2008  T:  03/29/2008  Job:  [id number]

## 2010-07-20 NOTE — Assessment & Plan Note (Signed)
[facility]                            CARDIOLOGY OFFICE NOTE   Kristen [name], Kristen [name]                 MRN:          [id number]  DATE:10/17/2006                            DOB:          12/28/1944    PRIMARY CARDIOLOGIST:  Arturo Morton. Riley Kill, MD, [facility]   PATIENT PROFILE:  A 65 year old [place] American female with prior  history of coronary artery disease and CABG who presents for followup.   PROBLEM LIST:  1. Coronary artery disease.      a.     May 03, 2002, status post coronary artery bypass graft       x1 with a Left internal mammary artery (LIMA) to the left anterior       descending artery (LAD).      b.     On June 16, 2005, adenosine Myoview revealing an EF of 64%       with probable breast attenuation without ischemia.  2. History of chronic atypical chest pain.  3. Hypertension.  4. Hyperlipidemia.  5. Anxiety.  6. Depression.  7. Gastroesophageal reflux disease.  8. History of transient left bundle branch block.  9. History of left temporal cerebral vascular accident.  10.Chronic back problems.  11.Chronic Coumadin therapy secondary to prior cerebral vascular      accident.   HISTORY OF PRESENT ILLNESS:  A 65 year old [place] American female with  the above problem list. She was last seen in clinic June of this year.  She reports a constant three week history of left axillary, left breast  and sternal discomfort that is worse with deep breathing, palpation and  movement and twisting. It occurs in focal locations along that tract  from the left axillary to her sternum. She has had similar pain in the  past. She also has chronic left shoulder pain, which was injected in May  of 2008, without much improvement. She says she does not want to have  that again because it drives her blood sugars wild. She has not had any  shortness of breath or anginal type pain since her last visit. She  remains fairly inactive and as a result,  her weight is up about ten  pounds since her last visit in June. She denies any PND or orthopnea,  dizziness, syncope, edema or early satiety.   HOME MEDICATIONS:  1. Toprol XL 100 mg daily.  2. Xanax 0.5 mg four tablets daily.  3. Flexeril 10 mg b.i.d.  4. Aspirin 81 mg daily.  5. Aldactone 25 mg daily.  6. Coumadin as directed.  7. Hydrochlorothiazide 25 mg half tablet daily.  8. Prevacid 30 mg b.i.d.  9. K-Dur 40 mEq daily.  10.Digoxin 0.25 mg one tablet alternating with half tablet every other      day.  11.Micardis 80 mg one daily.  12.Amaryl 4 mg b.i.d.  13.Januvia 100 mg daily.   PHYSICAL EXAMINATION:  Blood pressure 110/72, heart rate 72,  respirations 18, weight 228 pounds, up 10 pounds since her last visit.  Pleasant, African American female in no acute distress. Awake, alert and  oriented x3.  NECK: No bruits or  JVD.  LUNGS:  Respirations regular and unlabored, clear to auscultation.  CARDIAC: Regular S1, S2. No S3, S4 or murmurs. She does have focal chest  wall tenderness under the left breast, just to the left of the xyphoid  process and also in the left axillary area at the mid axillary line.  ABDOMEN: Round, soft and nontender. Nondistended. Bowel sounds present  x4.  EXTREMITIES: Warm, dry. No clubbing, cyanosis or edema. Dorsalis pedis,  posterior tibial pulses 2+ and equal bilaterally.   ACCESSORY CLINICAL FINDINGS:  EKG shows sinus rhythm with a left bundle  branch block, rate at 72 beats per minute. No acute ST-T changes.   ASSESSMENT/PLAN:  1. History of coronary artery disease with chronic chest pain. She      reports a musculoskeletal chest pain for which she is currently      using Voltaren cream as well as hydrocodone with varied results.      Her pain is clearly not cardiac as well as being focal and      reproducible. Will make no changes to her current regimen. She has      not had any anginal pain or significant dyspnea. She remains on       aspirin, beta-blocker and ARB therapy.  2. Hypertension. This is reasonably well-controlled. Continue current      regimen.  3. Hyperlipidemia. She had been on Crestor in the past. I am not clear      in reviewing her medications if she is still on this or not. With      her history of coronary artery disease and diabetes, she would      certainly benefit from continued statin therapy. I do not see an      allergy to it.  4. History of cerebral vascular accident. The patient remains on      chronic Coumadin therapy.  5. Morbid obesity. The patient is encouraged to increase activity and      modify diet.  6. Disposition. The patient will followup with Dr.  Riley Kill in 3-6      months or sooner as necessary.      Nicolasa Ducking, ANP  Electronically Signed      Everardo Beals. Juanda Chance, MD, [facility]  Electronically Signed   CB/MedQ  DD: 10/17/2006  DT: 10/18/2006  Job #: [id number]

## 2010-07-20 NOTE — H&P (Signed)
NAME:  Kristen [name], Kristen [name] NO.:  [id number]   MEDICAL RECORD NO.:  [id number]          PATIENT TYPE:  EMS   LOCATION:  MAJO                         FACILITY:  [facility]   PHYSICIAN:  Unice Cobble, MD     DATE OF BIRTH:  12/28/1944   DATE OF ADMISSION:  01/27/2008  DATE OF DISCHARGE:                              HISTORY & PHYSICAL   CARDIOLOGIST:  Dr. Riley Kill with [facility].   HISTORY OF PRESENT ILLNESS:  This is a 65 year old African American  female with a history of CABG and atypical chest pain who presents with  chest pain.  She had an episode of chest pain for 30 minutes yesterday  around 3 p.m. and again tonight.  The pain is associated with mild  shortness of breath and some mild lightheadedness.  There is no nausea  or vomiting or diaphoresis.  The pain comes on at rest and leaves at  rest.  She has had no similar pains like this in the past.  No pleuritic  component.  She cannot reproduce with palpation.  No orthopnea, PND,  edema.   PAST MEDICAL HISTORY:  1. Coronary disease status post CABG in February 2004 with a LIMA to      the LAD.  A subsequent Myoview in April 2007 showed no evidence of      ischemia and 64% ejection fraction.  2. Chronic atypical chest pain.  3. Hypertension.  4. Hyperlipidemia.  5. Anxiety.  6. Depression.  7. GERD.  8. Intermittent left bundle branch block.  9. History of stroke, left-sided - permanently on Coumadin.  10.Chronic back problems.  11.Fibromyalgia.   ALLERGIES:  MORPHINE and SULFA.   MEDICATIONS:  1. Toprol XL 100 mg nightly.  2. Xanax 0.5 mg q.i.d.  3. Aspirin 81 mg daily.  4. Aldactone 25 mg daily.  5. Coumadin 6 mg on Tuesdays and Saturdays and 3 mg on the other days.  6. Hydrochlorothiazide 12.5 mg daily.  7. Prevacid 30 mg b.i.d.  8. K-Dur 40 mEq daily.  9. Digoxin 0.25 mg alternating with 0.125 mg.  10.Micardis 80 mg daily.  11.Amaryl 4 mg b.i.d.  12.Lipitor 20 mg daily.  13.Byetta  injections 10 units b.i.d.  14.Levemir 20 units b.i.d.   SOCIAL HISTORY:  See lives in [place] with her husband and  grandchild.  She is on disability.  She does not smoke.  She quit in  2003.  No drugs or alcohol.   FAMILY HISTORY:  Mother had a stroke and father had heart attack.   REVIEW OF SYSTEMS:  She has been losing weight unintentionally.  She has  a mild dry cough.  All other systems reviewed and found to be negative.   PHYSICAL EXAM:  VITAL SIGNS:  Temperature is 96.7 with a pulse of 49,  respiratory rate of 20, blood pressure 117/73, O2 sats are 100% on 2L.  GENERAL:  She is in no acute distress.  Obese.  HEENT:  Shows PERRLA, EOMI, MMM, oropharynx without erythema or  exudates.  NECK:  Supple without lymphadenopathy, thyromegaly, bruits or jugular  venous distention.  HEART:  Has a regular  rate and rhythm with a normal S1 and S2.  No  murmurs, gallops or rubs.  Normal PMI.  Pulses 2+ and equal bilaterally  without bruits.  LUNGS:  Clear to auscultation bilaterally.  ABDOMEN:  Soft and nontender with normal bowel sounds.  No rebound or  guarding.  EXTREMITIES:  Show no cyanosis, clubbing or edema.  MUSCULOSKELETAL:  Shows no joint deformity or effusions.  No spine or  CVA tenderness.  No chest tenderness.  NEUROLOGICALLY:  She is alert and oriented x3 with cranial nerves II-XII  grossly intact.  Strength is 5/5 all extremities and axial grips with  normal sensation.   LABORATORY AND RADIOLOGY REVIEW:  Chest x-ray was read as no acute  cardiopulmonary disease.  EKG shows a rate of 60 in normal sinus rhythm.  She has right axis deviation with a left bundle branch block.  Labs show  a hemoglobin of 14.6 with a white count of 6.9.  Her basic metabolic  panel is unremarkable with the exception of a glucose of 167.  Her INR  is 2.6.  CK-MB and troponins are negative.   ASSESSMENT/PLAN:  This is a 65 year old African American female with a  history of CABG who presents  with chest pain.  1. Chest pain:  This chest pain is atypical in nature, but given her      history of CABG will need rule out with serial markers.  If      negative, then stress test is warranted upon discharge.  Home      medications will be continued.  2. Hypertension:  The patient describes some lightheadedness at home      and her Toprol XL will be decreased to 50 mg daily.  Dig level will      be checked.  3. Hyperlipidemia:  Statin.  4. Anxiety:  Xanax.  5. Diabetes mellitus:  Continue home doses with glucose checks.  6. Stroke:  Continue Coumadin.      Unice Cobble, MD  Electronically Signed     ACJ/MEDQ  D:  01/27/2008  T:  01/27/2008  Job:  [id number]

## 2010-07-20 NOTE — Assessment & Plan Note (Signed)
[facility]                            CARDIOLOGY OFFICE NOTE   Kristen, [name]                 MRN:          [id number]  DATE:10/31/2007                            DOB:          12/28/1944    [name] is in for a followup.  She saw Dr. Lovell Sheehan yesterday.  Her  main complaint is that she had some chest discomfort over the weekend.  She personally thinks it is musculoskeletal, although she did take a  nitroglycerin and it improved.  She is able to lay there on the table,  point to the area, which is in the area at the most inferior  costochondral junction, on pressing it hurts.  When I reproduced this,  she nearly jumps off the table.  There is no obvious overlying erythema.  She has had these before at the different locations.  She wonders  whether this would be the case.  She has also had some swelling in the  right lower extremity and recently underwent a Doppler, which did not  demonstrate DVT.   MEDICATIONS:  1. Toprol-XL 100 mg one-half tablet at night.  2. Xanax 4 daily.  3. Aspirin 81 mg daily.  4. Aldactone 25 mg daily.  5. Coumadin as directed.  6. HCTZ 25/0.5 daily.  7. Prevacid 30 mg b.i.d.  8. K-Dur 40 mEq daily.  9. Digoxin 0.25 mg one-half tablet every other day.  10.Micardis 80 mg daily.  11.Amaryl 4 mg b.i.d.  12.Byetta injection.  13.Lipitor 20 daily.  14.Levemir daily.   On physical, the blood pressure is 110/68 and pulse is 57.  The lung  fields are clear.  There is a paradoxical splitting of second heart  sound.  There is no definite extremity edema.  There does look like a  small hematoma at the site of the vein on the medial aspect of a right  leg.   EKG reveals sinus bradycardia with left bundle branch block.   The patient is stable at the present time.  I think it is probably  musculoskeletal and probably it is costochondritis.  She is clearly  tender to touch.  Dr. Lovell Sheehan has previously given her  prednisone, but  unfortunately that caused her sugar to rise rather substantially.  Despite the potential implications, I think something such as Aleve  twice a day for 2 to 3 days might be the  most helpful thing.  She will go ahead and get this at her next protime  check, we will get a basic metabolic profile on her to recheck her  potassium, which has not been done since January.     Arturo Morton. Riley Kill, MD, [facility]  Electronically Signed    TDS/MedQ  DD: 10/31/2007  DT: 11/01/2007  Job #: [id number]

## 2010-07-20 NOTE — Assessment & Plan Note (Signed)
Kristen [name]                            CARDIOLOGY OFFICE NOTE   Kristen [name], Kristen [name]                 MRN:          [id number]  DATE:02/04/2008                            DOB:          12/28/1944    Kristen [name] is in for followup.  She was recently discharged from the hospital.  She came in, at that time with some atypical chest pain.  She was pretty  sure that this was not ischemia mediated.  She was however, hypotensive  and somewhat bradycardic.  As a result, we made some adjustments in her  medications.  She went home and last night she became a little dizzy and  as a result she took her blood pressure and pulse, said her pulse at  times to be down in the 40s.  It also potentially was that her blood  pressure rose into the 160 range at times.  In general, she has felt  reasonably well.  Her ejection fraction was down at 40% by  echocardiography this admission with a previous ejection fraction  somewhat higher by stress Myoview in April 2007, although her ejection  fraction has somewhat bounced around.  She denies any ongoing  progressive chest pain fortunately.  She comes in today as an add-on.   Since discharge from the hospital, her medication list includes enteric-  coated aspirin 81 mg daily, Toprol XL 50 daily, Micardis 20 mg daily,  which is down from 80, Coumadin as directed, Lipitor 20 mg daily,  Prevacid 30 mg daily, Byetta 10 units b.i.d., Xanax 0.5 mg daily, Amaryl  4 mg b.i.d., digoxin 0.125 mg daily, and she is taking Levemir 20 units  b.i.d.  She unfortunately is continuing potassium, although it is  clearly not written on her discharge instructions.   On physical, the weight is 220 pounds which is identical to her last  office visit.  She thinks it is up over the past few days.  Her jugular  veins are not distended and her lung fields are clear to auscultation  and percussion.  There is paradoxical splitting of the second heart  sound.  The blood pressure is 126/80 and the pulse is 63 and regular.  There is no obvious extremity edema.   ASSESSMENT:  The patient's blood pressure is up from when she was in the  hospital.  She does claim that her pulse is slow and she was really  quite alert when this was going on.  We talked about various strategies  today and I planned to decrease her Toprol 50 to one half tablet daily.  She will resume hydrochlorothiazide at 12.5 mg daily which she would  prefer to do.  She has not been on potassium at the time of discharge,  but she continued to take 40 mEq.  In that she was not on a diuretic,  and may be that her level is abnormal, so we will go ahead and get a  basic metabolic profile today.  We will also check a dig level.  Hopefully, the drop in metoprolol will result in an increase in her  pulse.  We will try  to see her back in followup in about a week.     Arturo Morton. Riley Kill, MD, [facility]  Electronically Signed    TDS/MedQ  DD: 02/04/2008  DT: 02/05/2008  Job #: [id number]

## 2010-07-20 NOTE — Assessment & Plan Note (Signed)
[facility] HEALTHCARE                                 ON-CALL NOTE   Kristen [name], Kristen [name]                 MRN:          [id number]  DATE:02/02/2007                            DOB:          12/28/1944    TIME:  9:44 p.m.   PHONE NUMBER:  [phone].   REGULAR DOCTOR:  Dr. Lovell Sheehan.   CHIEF COMPLAINT:  Medicine reaction.    The patient called yesterday.  She had been switched from Lantus to  Levemir insulin and was having itchy splotches at all her injection  sites.  She thought she might be allergic to it, so she switched back to  her Lantus today.  She said, however, she just used her Lantus insulin.  She injected it in her arm and now her arm is swelling and tender from  the forearm up to the upper arm to the shoulder biceps area.  She is not  having any shortness of breath, fever, chills, redness, or any other  symptoms.  She is wondering if she could be allergic to this reaction  too.  I told her that it is puzzling that she has been having reactions  to all her injectables.  She told me she is also taking Januvia and  Amaryl and that the injectable medication was actually somewhat new  within the past month.  I told her to hold off on any more injectable  insulin over the weekend and to track her sugar closely, to watch her  diet closely, continue her Januvia and Amaryl and call Dr. Lovell Sheehan'  office for further instructions on Monday and to schedule followup.  She  will call me back if arm swelling does not go down or if she develops  any new symptoms such as shortness of breath or fever.  Otherwise, she  will call Dr. Lovell Sheehan for further advice on Monday.     Marne A. Tower, MD  Electronically Signed    MAT/MedQ  DD: 02/02/2007  DT: 02/02/2007  Job #: [id number]

## 2010-07-20 NOTE — Letter (Signed)
April 04, 2007    Stacie Glaze, MD  [address] [place], [place] [address]   RE:  Kristen [name], Kristen [name]  MRN:  [id number]  /  DOB:  12/28/1944   Dear Jonny Ruiz,   I had the pleasure seeing your nice patient, Kristen [name], today  in the office in follow-up.  Her biggest problem has been left arm and  shoulder stiffness.  She apparently has been told by Dr. Wyline Mood that she  has a brachial plexus problem.  She clearly is tender with movement of  the left shoulder.  She has had some mild occasional chest discomfort  but nothing of progress. I also understand she has had quite a bit of  problem with her capillary blood glucoses and she has been on a variety  of different medications at the present time.  Cardiac-wise she  specifically feels like she is doing pretty well.   Today on examination, the blood pressure was 132/80, the pulse was 86,  the lung fields were clear.  The cardiac examination was remarkable for  an S4, paradoxical splitting of the second heart sound.  The extremities  revealed no edema.   The EKG reveals normal sinus rhythm with left bundle branch block.   In general, I think she is in a reasonable state from a cardiac  standpoint, she has not had increased symptoms.  She tells me that she  had been watching her lipids and that in fact she is  on Lipitor 20 daily.  She has not had a basic metabolic profile in a  while, and given the high dose of potassium, the hydrochlorothiazide and  the Aldactone I thought it would be appropriate to check this today.  We  will forward these to your office. I will not see her for 6 months in  follow-up unless further problems would arise. We would however be happy  to see her at any time.    Sincerely,      Arturo Morton. Riley Kill, MD, [facility]  Electronically Signed    TDS/MedQ  DD: 04/04/2007  DT: 04/05/2007  Job #: [id number]

## 2010-07-20 NOTE — Telephone Encounter (Signed)
Pt. Is concerned about her insurance papers, and all the diagnosis comments.  Did not think she had DM, and High BP and kidney are as bad as those papers show.  She wants to know her BUN and CREAT.  Cannot locate it in the computer.

## 2010-07-20 NOTE — Cardiovascular Report (Signed)
NAME:  KYLEENA, SCHEIRER        ACCOUNT NO.:  [id number]   MEDICAL RECORD NO.:  [id number]          PATIENT TYPE:  OIB   LOCATION:  2899                         FACILITY:  [facility]   PHYSICIAN:  Arturo Morton. Riley Kill, MD, FACCDATE OF BIRTH:  12/28/1944   DATE OF PROCEDURE:  04/04/2008  DATE OF DISCHARGE:  04/04/2008                            CARDIAC CATHETERIZATION   INDICATIONS:  Kristen [name] is well-known to me.  She underwent revascularization  surgery in 2004 for recurrent mid-LAD stenosis.  She has recently  presented with some chest discomfort.  She has underlying left bundle.  She was seen back in the office and continued to be followed, but she  continued to have some recurrent episodes as a result, we were uncertain  as to her status, and finally elected to recommend discontinuation of  her Coumadin and elective diagnostic cath.  Risks, benefits and  alternatives were discussed with her in detail after several office  visits.  She was agreeable to proceed.   PROCEDURE:  1. Placement of catheters without left heart catheterization.  2. Coronary arteriography.  3. Selective left internal mammography.   DESCRIPTION OF PROCEDURE:  The procedure was performed via the right  femoral artery.  We used a Smart needle to gain access from the first  stick.  A 5-French sheath was placed.  Views of the left and right  coronary arteries were then obtained in multiple angiographic  projections.  Following this, a mammary catheter was used to engage the  internal mammary.  Following this, all catheters were subsequently  removed.  The patient was taken to the holding area in satisfactory  clinical condition.   HEMODYNAMIC DATA:  Central aortic pressure was 126/76, mean 99.   ANGIOGRAPHIC DATA:  1. The left main is free of critical disease.  2. The LAD has about 20% proximal narrowing.  The vessel then opens up      and then provides a large bifurcating diagonal, just after the      diagonal was  about 30-40% narrowing.  There was bidirectional flow      in the distal LAD where the graft is inserted.  The vessel was      widely patent distally.  3. The internal mammary to the distal LAD is widely patent.  4. The circumflex provides a large proximal marginal, a smaller second      marginal and then a third larger marginal, there was a      posterolateral branch as well.  No areas of high-grade obstruction      was noted.  5. The right coronary artery provides a PDA and a small posterolateral      branch.  This appears to be free of critical disease.   IMPRESSION:  1. Continued patency of the internal mammary to the left anterior      descending.  2. Patency of the left anterior descending with less severe stenosis      now on the left anterior descending.  3. No other high-grade obstruction.   PLAN:  We will plan to treat the patient medically.  She will follow up  in the office  with me.      Arturo Morton. Riley Kill, MD, [facility]  Electronically Signed     TDS/MEDQ  D:  04/04/2008  T:  04/05/2008  Job:  [id number]   cc:   CV Laboratory

## 2010-07-20 NOTE — Assessment & Plan Note (Signed)
[facility]                                 ON-CALL NOTE   [name], [name]                 MRN:          [id number]  DATE:10/01/2006                            DOB:          12/28/1944    Telephone triage note on July 27.  Time received is 5:21 p.m.  The  patient is Kristen [name].  The caller is the same.  She sees Dr.  Lovell Sheehan.  Telephone [phone].  The patient has had intermittent pains in  the left shoulder, the back, and the left side of her chest for the past  month.  Now for the past 2 days it has been more constant.  There is no  shortness of breath and no nausea.  She can make the pain worse by  moving or twisting her body certain ways or taking a deep breath.  She  cannot take nonsteroidal anti-inflammatory medications because she is on  Coumadin.  She has been using Vicodin which gives her temporary relief.  There is no fever and no cough.  My response is to continue with Vicodin  on as needed basis tonight and she should see Dr. Lovell Sheehan in the office  tomorrow for further evaluation.  If her symptoms change in any way  tonight she can call me back.     Tera Mater. Clent Ridges, MD  Electronically Signed    SAF/MedQ  DD: 10/01/2006  DT: 10/02/2006  Job #: [id number]

## 2010-07-20 NOTE — Assessment & Plan Note (Signed)
[facility]                                 ON-CALL NOTE   CRISTIANNA, CYR                 MRN:          [id number]  DATE:02/01/2007                            DOB:          12/28/1944    Patient named Kristen [name], date of birth December 28, 1944, phone  number [phone].  Regular doctor is  Dr. Lovell Sheehan.  I am Dr. Milinda Antis on call February 01, 2007 at 1:38 p.m.   CHIEF COMPLAINT:  Medicine reaction.   The patient states she has been taking some Levemir insulin which is  somewhat new for her for about a week.  She was put on it from switching  from Lantus because it was more slow-acting.  She said that she has had  itchy splotches at the injection site since she has been using it.  She  called last week, and they told her to switch her injection site from  her leg to her abdomen which she did.  She has given two doses in her  abdomen so far, and she said she has a big red whelp there which is  itching.  She feels generally somewhat lousy, but does not have any  shortness of breath, mouth or throat swelling.  She wanted to know what  she needed to do.  She used to use Lantus 10 units once a day in the  morning.  I told her to switch back from the Levemir to the Lantus units  like she used to use, to keep an eye on her blood sugars, and she can  use a cool compress on the sites on her abdomen that are swollen.  I  told her to watch closely for shortness of breath, mouth or throat  swelling, and if those occur to call and go to the emergency room.  She  is afraid to use Benadryl or any over-the-counter medicine because she  is on a lot of other medications currently including Coumadin.  I urged  her to call back if she has any problems.  Otherwise, she will call the  office tomorrow for further instructions.     Marne A. Tower, MD  Electronically Signed    MAT/MedQ  DD: 02/01/2007  DT: 02/01/2007  Job #: [id number]

## 2010-07-20 NOTE — Assessment & Plan Note (Signed)
[facility]                            CARDIOLOGY OFFICE NOTE   [name], [name]               MRN:          [id number]  DATE:04/07/2008                            DOB:          12/28/1944    Kristen [name] is in for a followup.  Her back continues to bother her.  Cardiac  wise, however, she has not had any recurrent discomfort.  She has had  some palpitations.  She underwent repeat catheterization.  Her LIMA is  patent, although a bit atretic as the flow down the native LAD remains  pretty good,  The lesion in the native LAD does not appear to be  critically tight.  Fortunately, she has not had high-grade disease.  She  has gone back on her Coumadin.  Her groin site is well-healed.   Today on examination briefly, the blood pressure is 105/66.  The pulse  is 85.  The groin itself looks good.   Kristen [name] is stable.  She will remain on the current medical regimen.  She  will resume her Coumadin shortly.  I will see her back in followup in  approximately 2-3 months.  Should she have any problems in the interim,  she is to call us.  We will continue her on a medical regimen.   ADDENDUM:  I have reminded her to make sure she gets her potassium.     Arturo Morton. Riley Kill, MD, [facility]  Electronically Signed    TDS/MedQ  DD: 04/07/2008  DT: 04/08/2008  Job #: [id number]

## 2010-07-20 NOTE — Assessment & Plan Note (Signed)
[facility]                            CARDIOLOGY OFFICE NOTE   VELA, RENDER                 MRN:          [id number]  DATE:02/11/2008                            DOB:          12/28/1944    Kristen [name] is back in for followup.  In general, she is really much improved.  She feels quite a bit better since her beta-blocker was decreased.  We  have started her on hydrochlorothiazide at low dose.  She has had some  constipation, feels some discomfort in the left upper intestinal region.  It is inframammary.   Today on exam, the blood pressure is 138/84, the pulse is 64.  The  jugular veins are not distended.  The carotid upstrokes are brisk.  The  lungs are entirely clear to auscultation and percussion.  There is  paradoxical splitting of the second heart sound.  There is no extremity  edema.   Her recent laboratory work includes a BUN of 15, creatinine 0.8,  potassium 3.9.  The glucose was 106.  Dig level was less than 0.1.   Overall, she is doing well.  She seems to be symptomatically improved.  We will continue on the same medical regimen.  I will plan to see her  back in follow up in about 4-5 weeks.  Should she have any problems in  the interim, she is to call us.     Arturo Morton. Riley Kill, MD, [facility]  Electronically Signed    TDS/MedQ  DD: 02/11/2008  DT: 02/12/2008  Job #: [id number]

## 2010-07-20 NOTE — Assessment & Plan Note (Signed)
[facility] HEALTHCARE                            CARDIOLOGY OFFICE NOTE   Kristen [name], Kristen [name]                 MRN:          [id number]  DATE:08/14/2006                            DOB:          12/28/1944    Kristen [name] is in for followup. She has occasional palpitations. She had her  shoulder injected last week and her glucose went up. She thinks that the  Crestor is causing a diffuse myalgia. She denies any ongoing chest pain  specifically.   She has an extensive medication list. This includes:  1. Toprol XL 100 mg in the morning and 50 mg at night.  2. Xanax 0.5 mg q.i.d.  3. Flexeril 10 mg p.o. b.i.d.  4. Aspirin 81 mg daily.  5. Aldactone 25 mg daily.  6. Coumadin as directed.  7. Hydrochlorothiazide 25 mg one-half tablet daily.  8. Prevacid 30 mg p.o. b.i.d.  9. K-Dur 40 mEq daily.  10.Digoxin 0.25 mg one-half tablet per day.  11.Micardis 80 mg daily.  12.Crestor 10 mg daily.  13.Amaryl 4 mg b.i.d.  14.Lantus.   PHYSICAL EXAMINATION:  She is alert and oriented in no distress. Weight  is 218 pounds. Blood pressure is 110/70, pulse 68.  Lung fields are clear.  There is a paradoxical splitting of the second heart sound without a  significant murmur.  EXTREMITIES: Reveal no edema.  No carotid bruits are noted.   EKG: Reveals normal sinus rhythm with left bundle branch block.   IMPRESSION:  1. Coronary artery disease status post coronary artery bypass graft      surgery.  2. Hypercholesterolemia, on lipid lowering therapy.  3. Possible symptoms related to Crestor.  4. Insulin dependent diabetes mellitus.   PLAN:  1. Crestor holiday for 6 weeks.  2. Return to clinic in two months.  3. Repeat lipid profile off of drug therapy.     Arturo Morton. Riley Kill, MD, [facility]  Electronically Signed    TDS/MedQ  DD: 08/14/2006  DT: 08/14/2006  Job #: [id number]

## 2010-07-20 NOTE — Discharge Summary (Signed)
NAME:  Kristen [name], Kristen [name] NO.:  [id number]   MEDICAL RECORD NO.:  [id number]          PATIENT TYPE:  INP   LOCATION:  2041                         FACILITY:  [facility]   PHYSICIAN:  Arturo Morton. Riley Kill, MD, FACCDATE OF BIRTH:  12/28/1944   DATE OF ADMISSION:  01/27/2008  DATE OF DISCHARGE:  01/29/2008                               DISCHARGE SUMMARY   PRIMARY CARDIOLOGIST:  Maisie Fus D. Riley Kill, MD, [facility].   PRIMARY CARE:  Stacie Glaze, MD.   DISCHARGING DIAGNOSES:  1. Chest pain, atypical but the patient with previous history of      atypical symptoms prior to coronary artery bypass grafting.  2. Left ventricular dysfunction with ejection fraction now 40% by      echocardiogram this admission, previous ejection fraction 64% by a      stress Myoview in April 2007 at our office, otherwise without      ischemia.  3. Mild obstructive sleep apnea.  4. Coronary artery disease, status post coronary artery bypass graft.  5. History of hypertension.  6. Left bundle-branch block.  7. Fibromyalgia.  8. Anxiety and depression.  9. Gastroesophageal reflux disease.  10.Hyperlipidemia.  11.History of cerebrovascular accident.  12.Chronic back pain.  13.Chronic anticoagulation therapy secondary to prior cerebrovascular      accident.  14.Diabetes.   [name] is a 65 year old African American female with known history  of coronary artery disease in the setting of bypass and atypical chest  discomfort.  She presented on day of admission complaining of chest  discomfort associated with mild shortness of breath and lightheadedness.  She was admitted for observation.  Cardiac enzymes were negative.  EKG  showing sinus rhythm, left bundle-branch block.  The patient has had  periods of sinus brady during hospitalization requiring modifications in  her medications.  She has also remained hypotensive with a blood  pressure 90s/50s and 100s/60s.  She continues to complain of ongoing  fatigue.  A 2-D echocardiogram obtained showing EF now 40% with mild TVR  mild diastolic dysfunction.  The patient evaluated by Dr. Riley Kill,  adjustments made in medications.  The patient being discharged home to  follow up with a blood pressure check and a BMET next week, BMET for  hypokalemia and then follow up with Dr. Riley Kill on February 20, 2008, at  2:30 at the office.  At that time, we will reevaluate the patient's  symptoms, could consider repeating right heart catheterization for  further evaluation.  The patient's INR this admission at the time of  discharge is 2.4.   MEDICATIONS:  1. Aspirin 81.  2. Toprol-XL has been decreased to 50 mg daily.  3. Micardis has been decreased to 20 mg daily, the hydrochlorothiazide      component is on hold.  4. Warfarin as previously prescribed.  5. Lipitor 20 daily.  6. Aldactone has been discontinued.  7. Prevacid 30 daily.  8. Byetta 10 units b.i.d.  9. Levemir 20 units b.i.d.  10.Vicodin 7.5/500 as previously prescribed.  11.Xanax 0.5 mg q.i.d.  12.Amaryl 4 mg b.i.d.  13.Digoxin has been changed to 0.125 mg daily.   The patient  has a prescription for the Micardis.  Verbalizes  understanding of followup instructions.   DURATION OF DISCHARGE ENCOUNTER:  Greater than 30 minutes.      Dorian Pod, ACNP      Arturo Morton. Riley Kill, MD, [facility]  Electronically Signed    MB/MEDQ  D:  01/29/2008  T:  01/30/2008  Job:  [id number]

## 2010-07-20 NOTE — Assessment & Plan Note (Signed)
[facility] HEALTHCARE                                 ON-CALL NOTE   [name], [name]                 MRN:          [id number]  DATE:08/24/2006                            DOB:          12/28/1944    PRIMARY CARE [name]:  Dr. Lovell Sheehan.   [name] calls in today stating that she has had several episodes  of feeling dizzy associated with sweats.  She denies any chest pain, but  states that she has had a sensation of tightening in her throat.  The  symptoms are currently not present, but occurred this evening.  She  reports that this had been going on previously, typically associated  with high blood sugars with a history of type 2 diabetes.  She did check  her blood sugar after dinner this evening and states that it was in the  140s.  The patient reports that she had been feeling fatigued for a  while.   PLAN:  Advised the patient that she should be seen in the emergency  department immediately.  It is not uncommon for diabetics to present  with atypical symptoms that are consistent with coronary disease.  I  advised the patient of the above.  She did agree to be assessed in the  emergency department.     Leanne Chang, M.D.  Electronically Signed    LA/MedQ  DD: 08/24/2006  DT: 08/25/2006  Job #: [id number]

## 2010-07-20 NOTE — Assessment & Plan Note (Signed)
[facility] HEALTHCARE                                 ON-CALL NOTE   Kristen, [name]                 MRN:          [id number]  DATE:09/02/2006                            DOB:          12/28/1944    PRIMARY CARE PHYSICIAN:  Dr. Lovell Sheehan.   ON CALL PROGRESS NOTE:  The patient called this evening complaining of  dizziness. She also states that her heart rate was low. She says that  she took it and it as 40 to 50 beats per minute. The patient says she  also has been having trouble speaking and slurring her words or speaking  slowly. The patient has become concerned about this and requested what  to do. She states that she is on metoprolol, for which she is taking  daily along with Digoxin. The Toprol is 100 mg in the morning and 50 mg  at night. I had advised the patient to not take her 50 mg dose this  evening. She states that she woke up in the middle of the night last  night with her heart rate in the 120's. She is also complaining of  feeling very light headed. The patient was also anxious when she was  speaking with me and although she was oriented, she was speaking rather  slowly.   I have advised the patient, secondary to her history of diabetes and her  symptoms along with her heart rate in the 50's, that she would probably  need to be seen in the emergency room. On evaluation, the patient has  been seen fairly often in the emergency room for similar symptoms of  dizziness. The patient has a history of coronary artery disease with  coronary artery bypass grafting and insulin-dependent diabetes. My  advise to her was that she be seen in the emergency room to be evaluated  for hypoglycemia and to monitor her heart rate. I have advised her that  if there is a need for cardiology to see her in the emergency room, Dr.  Cheree Ditto, who is on call, will be advised to come and evaluate her.      [name]. [name], NP  Electronically Signed      Learta Codding, MD,FACC  Electronically Signed   KML/MedQ  DD: 09/02/2006  DT: 09/02/2006  Job #: [id number]   cc:   Learta Codding, MD,FACC  Arturo Morton. Riley Kill, MD, [facility]  Stacie Glaze, MD

## 2010-07-20 NOTE — Assessment & Plan Note (Signed)
[facility] HEALTHCARE                                 ON-CALL NOTE   EDRIE, EHRICH                 MRN:          [id number]  DATE:09/03/2006                            DOB:          12/28/1944    CHIEF COMPLAINT:  I was called to the emergency department at [facility]  to evaluate Kristen [name] who presented with a chief complaint of  palpitations. The patient states that since changing her metoprolol from  the extended release formulation to the immediate release formulation,  she has been having more frequent palpitations.   PHYSICAL EXAMINATION:  On my examination, the patient was noted to be in  normal sinus rhythm with a heart rate of 69. Her EKG revealed sinus  bradycardia with no evidence of arrhythmia or AV block. The patient  states that she took her metoprolol dose in the morning, however she has  not taken the evening dose. Again, she attributes all of her symptoms to  the changing of the formulation from the extended release to the  immediate release.   RECOMMENDATIONS:  I called her pharmacy and spoke to the pharmacist. He  stated that they did have a prescription on file for her for the Toprol  XL formulation and I have asked them to prepare that for her to pick up.  I have recommended that she go back to her extended release formulation  and take 100 mg once daily and record her blood pressure twice a day for  one week. She has follow up with her primary care physician, Dr.  Lovell Sheehan, on July 9, at which time she should present her blood pressure  log. If her blood pressure needs additional control, they can consider  changing her dose back to 100 mg in the morning and 50 mg at night. Ms.  Kristen [name] voiced that she understood these instructions and will  contact us if she has any additional problems.     Audery Amel, MD  Electronically Signed    SHG/MedQ  DD: 09/03/2006  DT: 09/03/2006  Job #: [id number]

## 2010-07-20 NOTE — Telephone Encounter (Signed)
Faxed LOV & EKG to [place] at [facility] ([phone]). °

## 2010-07-21 NOTE — Telephone Encounter (Signed)
Pt informed of what [name] said-- suggested they discuss at upcoming ov- mostly concerned if she has anything wrong with kidney and pt informed there is no diagnosis at present to substantiate that.

## 2010-07-21 NOTE — Telephone Encounter (Signed)
Many of the diagnoses are placed by cardiology and Dr. Everardo All May share with her last creatinine and BUN

## 2010-07-23 NOTE — Discharge Summary (Signed)
[name], Kristen [name]             ACCOUNT NO.:  [id number]   MEDICAL RECORD NO.:  [id number]          PATIENT TYPE:  INP   LOCATION:  2016                         FACILITY:  [facility]   PHYSICIAN:  Jesse Sans. Wall, M.D.   DATE OF BIRTH:  12/28/1944   DATE OF ADMISSION:  10/22/2003  DATE OF DISCHARGE:  10/25/2003                           DISCHARGE SUMMARY - REFERRING   DISCHARGE DIAGNOSES:  1.  Known coronary artery disease, currently not obstructive, medical      management.  2.  Nausea, placed on proton pump inhibitor.  3.  Hypokalemia, replaced.  4.  Hypertension, treated.  5.  Anxiety and depression.  6.  History of left bundle branch block.  7.  History of nonsustained ventricular tachycardia.  8.  History of left temporal cerebrovascular accident on Coumadin longterm.  9.  Longterm Coumadin use.  10. Status post cholecystectomy.  11. Status post hysterectomy.  12. Allergy to sulfa, codeine, morphine, doxycycline, pentazocine, naloxone.   HOSPITAL COURSE:  The patient is a 65 year old female with a known history  of coronary artery disease of her LAD where she received an eventual graft  to her LAD via the LIMA.  She has undergone cutting balloon angioplasty of  that area in the past.  She presented on October 22, 2003, with a four-week  progression of chest tightness, nausea, and shortness of breath.  She  underwent cardiac catheterization by Dr. Riley Kill and was found to have a  patent LIMA with competitive filling.   She was also seen by the GI service and she underwent an EGD that was  normal.  Dr. Marina Goodell did not feel that her nausea was primarily GI in nature.  He recommended stopping her Provigil which was new and continue her PPI.   During her hospital stay, she had lab work that revealed hemoglobin of 13.3,  hematocrit 38.1, platelets 285, white count 5.5, negative cardiac isoenzymes  and a troponin level.  Total cholesterol 106, triglycerides 74, HDL 34, LDL  57, TSH  1.406.   Chest x-ray revealed no acute cardiopulmonary disease.   EKG; normal sinus rhythm, rate 65, with Q wave inversion in the anterior  leads.   The patient was thought to be ready for discharge on October 25, 2003.  At  this point, she was to discontinue her Provigil, otherwise continue all of  her home medications.   DISCHARGE MEDICATIONS:  1.  Avapro 300 mg a day.  2.  Cymbalta daily.  3.  Coumadin.  4.  Potassium 20 mEq a day.  5.  Spironolactone 25 mg a day.  6.  Xanax p.r.n.  7.  Hydrochlorothiazide 12.5 mg daily.  8.  Aspirin 81 mg a day.  9.  Flexeril 10 mg t.i.d.  10. Hydrocodone p.r.n.  11. Prevacid b.i.d.  12. Toprol 100 mg b.i.d.  13. Lipitor 40 mg q.h.s.   DISCHARGE INSTRUCTIONS:  No straining and no lifting over 10 pounds for one  week.  Remain on a low fat diet.  Clean her catheterization site with soap  and water, no scrubbing.  Call for questions or concerns at  [phone].  She  is to call Dr. Marina Goodell to set up an appointment on August 15.  She needs to  have her Coumadin level checked in one week.   During the patient's hospital stay, she did undergo a transthoracic  echocardiogram that revealed poor acoustic window and the LV systolic  function was not able to be fully evaluated.       LB/MEDQ  D:  12/08/2003  T:  12/08/2003  Job:  [id number]   cc:   Stacie Glaze, M.D. [facility]   Maisie Fus D. Riley Kill, M.D. [facility]

## 2010-07-23 NOTE — Cardiovascular Report (Signed)
   NAME:  Kristen [name], Kristen [name]                       ACCOUNT NO.:  [id number]   MEDICAL RECORD NO.:  [id number]                   PATIENT TYPE:  INP   LOCATION:  6525                                 FACILITY:  [facility]   PHYSICIAN:  Noralyn Pick. Eden Emms, M.D. [facility]           DATE OF BIRTH:  12/28/1944   DATE OF PROCEDURE:  DATE OF DISCHARGE:  10/03/2001                              CARDIAC CATHETERIZATION   INDICATIONS:  Recurrent chest pain.  The patient had a previous cutting  balloon at the mid LAD by Dr. Emilie Rutter. Pulsipher in April of 2003.  She was  readmitted with unstable angina.   DESCRIPTION OF PROCEDURE:  Standard access was gained from the right femoral  artery.   FINDINGS:  1. Left main coronary artery had a 20% restenosis.  2. Mid left anterior descending artery had a 70-80% restenotic lesion where     the previous cutting balloon was.  First intermediate branch was normal.     First diagonal branch was normal and the lesion was just after the first     diagonal branch.  3. Circumflex coronary artery was normal.  4. Right coronary artery was normal.   VENTRICULOGRAPHY:  RAO ventriculography:  RAO ventriculography revealed  anteroapical hypokinesis.  The ejection fraction was in the 50% range.  Aortic pressure was 143/67.  LV pressure was 144/14.   IMPRESSION AND PLAN:  Films will be reviewed with Dr. Veneda Melter in case  the patient needs repeat intervention for left anterior descending .  He  will take a look at the films and see if he thinks it is large enough to  place a stent versus a simple repeat angioplasty of the previously cutting  balloon site.                                                Noralyn Pick. Eden Emms, M.D. [facility]    PCN/MEDQ  D:  10/02/2001  T:  10/08/2001  Job:  [id number]   cc:   Cristi Loron, M.D.   Arturo Morton. Riley Kill, M.D. [facility]

## 2010-07-23 NOTE — Discharge Summary (Signed)
NAME:  CLARKE, AMBURN                       ACCOUNT NO.:  [id number]   MEDICAL RECORD NO.:  [id number]                   [name] TYPE:  INP   LOCATION:  6525                                 FACILITY:  [facility]   PHYSICIAN:  Jory Ee                        DATE OF BIRTH:  12/28/1944   DATE OF ADMISSION:  10/01/2001  DATE OF DISCHARGE:  10/03/2001                           DISCHARGE SUMMARY - REFERRING   PROCEDURE:  Coronary angiogram, PTCA of LAD on October 02, 2001.   REASON FOR ADMISSION:  Kristen [name] is a 65 year old female, with known  coronary artery disease, followed by Dr. Shawnie Pons, who underwent  previous cutting balloon PTCA of Kristen LAD in April of this year, and who now  presents with recurrent chest pain.  Kristen [name] with recurrent, similar  chest pain.  Please refer to dictated admission note for full details.   LABORATORY DATA:  Normal CBC and normal metabolic profile.  Normal hepatic  profile.  Cardiac enzymes - normal CPK/MB and troponin I markers x3.  Lipid  profile - total cholesterol 167, triglycerides 130, HDL 39, LDL 102  (cholesterol/HDL ratio 4.3).  Potassium 3.5 at discharge.  Admission CXR -  NAD.   HOSPITAL COURSE:  Kristen [name] was admitted for management of unstable angina  pectoris.  She was continued on home medication regimen of aspirin, Plavix,  and Toprol.  Intravenous nitroglycerin and heparin were added.  Serial  cardiac enzymes were normal.  Coronary angiogram performed on October 02, 2001,  by Dr. Eden Emms (see results for full details) revealed single vessel coronary  artery disease with 70-80% restenosis of Kristen mid LAD at prior PTCA site.  Left ventricular function was mildly depressed (approximately 50%) with  anteroapical hypokinesis.  Kristen [name] underwent subsequent successful  repeat cutting balloon PTCA of Kristen 80% LAD lesion to 10% residual stenosis  with no noted complication.  Kristen [name] was cleared for discharge Kristen  following  morning in hemodynamically stable condition.  Mild hypokalemia was  repleted.  Liver function was within normal limits.  No new medication  adjustments made during this brief stay.   DISCHARGE MEDICATIONS:  Toprol XL 100 mg q.d., Plavix 75 mg q.d., aspirin 81  mg q.d., Maxzide 25 mg q.d., Cardizem as previously directed, Xanax 0.5 mg  q.i.d., K-Dur 20 mEq q.d., Flexeril 10 mg q.h.s., Wellbutrin 150 mg b.i.d.,  Zocor as previously directed.   DISCHARGE INSTRUCTIONS:  No heavy lifting/[name] instructions.  Low  fat/cholesterol diet.  Keep wound site clean and dry.  Call Kristen office if  there is any swelling/bleeding, or ongoing pain.  Kristen [name] is scheduled  to follow up with Dr. Jasper Riling.A. clinic on Wednesday, October 24, 2001, at 11 a.m.   DISCHARGE DIAGNOSES:  1. Single vessel coronary artery disease with recurrent chest pain.     a. Status post cutting balloon  percutaneous transluminal coronary        angioplasty of 80% mid left anterior descending secondary to in-stent        restenosis.     b. Status post cutting balloon percutaneous transluminal coronary        angioplasty of left anterior descending in April of 2003.     c. Mild left anterior descending (ejection fraction of approximately        50%).  2. Chronic hypokalemia.  3. Hypertension.  4.     Dyslipidemia.  5. Chronic left bundle branch block.  6. History of tobacco.                                               Jory Ee    JS/MEDQ  D:  10/03/2001  T:  10/08/2001  Job:  [id number]   cc:   Stacie Glaze, M.D. [facility]

## 2010-07-23 NOTE — Cardiovascular Report (Signed)
NAME:  Kristen [name], Kristen [name]                      ACCOUNT NO.:  [id number]   MEDICAL RECORD NO.:  [id number]                   PATIENT TYPE:  INP   LOCATION:  3712                                 FACILITY:  [facility]   PHYSICIAN:  Arturo Morton. Riley Kill, M.D. [facility]         DATE OF BIRTH:  12/28/1944   DATE OF PROCEDURE:  10/02/2001  DATE OF DISCHARGE:                              CARDIAC CATHETERIZATION   PROCEDURE PERFORMED:  1. Left heart catheterization.  2. Selective coronary arteriography.  3. Selective left ventriculography.   CARDIOLOGIST:  Arturo Morton. Riley Kill, M.D. [facility]   INDICATIONS FOR PROCEDURE:  The patient is a 65 year old who previously has  undergone a cutting balloon intervention of the left anterior descending  artery x2.  She developed recurrent chest pain and was brought back to the  laboratory for further evaluation.   DESCRIPTION OF PROCEDURE:  The procedure was performed from the right  femoral artery using a 6-French catheter.  She tolerated the procedure  without complication.  Importantly at the end of the case, she developed  chest pain which was somewhat excruciating.  On careful examination with Volney Presser at the bedside, the patient was exquisitely tender in the left second  costochondral junction.  This reproduced all of her chest pain.  She was  eventually taken to the holding area in satisfactory clinical condition.   HEMODYNAMIC DATA:  1. Central aorta:  143/82.  2. Left ventricle:  136/10.  3. No aortic to left ventricular gradient on pullback across the aortic     valve.   LEFT VENTRICULOGRAPHY:  The ventriculography was performed in the RAO  projection.  Overall systolic function was well-preserved and no segmental  abnormalities or contraction were identified.  The ejection fraction was in  excess of 50%.   CORONARY ANGIOGRAPHY:  1. Left main coronary artery:  The left main coronary artery had tapered,     narrowing at the ostium, of about 20%.  This  appears unchanged from the     previous study.  2. Left anterior descending coronary artery:  The left anterior descending     coronary artery courses to the apex, and provides one major diagonal     branch.  Just past the diagonal branch, there is about a 30% narrowing at     the previous site of cutting balloon angioplasty.  There is about 30%-40%     narrowing in the mid vessel which is also unchanged from the previous     study.  3. Circumflex coronary artery:  The circumflex coronary artery provides a     proximal marginal branch and a more distal marginal branch.  These     vessels appear to be free of critical disease.  4. Right coronary artery:  The right coronary artery is a fairly large     caliber vessel, providing a large posterior descending and small     posterolateral branch.  This also appears  to be free of significant     disease.   CONCLUSION:  1. Preserved overall left ventricular function.  2. Chest pain, probably secondary to costochondritis.  3. No elements of high-grade restenosis at the previous site of percutaneous     intervention.   PLAN:  1. Continue medical therapy.  2. Treatment for musculoskeletal pain.                                                 Arturo Morton. Riley Kill, M.D. [facility]    TDS/MEDQ  D:  10/26/2001  T:  10/29/2001  Job:  [id number]   cc:   Arturo Morton. Riley Kill, M.D. [facility]   CVTS Lab

## 2010-07-23 NOTE — Op Note (Signed)
. [facility]  Patient:    Kristen [name], Kristen [name] Visit Number: [id number] MRN: [id number]          Service Type: DSU Location: [facility] Attending Physician:  Twana First Dictated by:   Elana Alm Thurston Hole, M.D. Proc. Date: 11/27/00 Admit Date:  11/27/2000                             Operative Report  PREOPERATIVE DIAGNOSIS:  Right knee medial meniscus tear.  POSTOPERATIVE DIAGNOSES: 1. Right knee medial and lateral meniscal tear. 2. 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  OPERATION/PROCEDURE: 1. Right knee examination under anesthesia followed by arthroscopic partial    medial and lateral meniscectomies. 2. Right knee chondroplasty.  SURGEON:  Elana Alm. Thurston Hole, M.D.  ASSISTANT:  Julien Girt, P.A.  ANESTHESIA:  Local and MAC anesthesia.  OPERATIVE TIME:  30 minutes.  COMPLICATIONS:  None.  INDICATION FOR PROCEDURE:  [name] is a 65 year old woman who has had significant right knee pain, increasing in nature over the past three to four months with signs and symptoms and MRI documenting medial meniscus tear, who has failed conservative care and is now to undergo arthroscopy.  DESCRIPTION OF PROCEDURE:  [name] was brought to the operating room on November 27, 2000, after a block had been placed in the holding room. She was placed on the operating table in the supine position.  Her right knee was examined under anesthesia.  Range of motion 0 to 125 degrees, 1 to 2+ crepitation, knee stable ligamentous exam with normal patella tracking.  Her right leg was prepped using sterile Betadine and draped using sterile technique.  Originally, through an inferior lateral portal, the arthroscope with a pump attachment was placed and through an inferior medial portal, an arthroscopic probe was placed.  On initial inspection of the medial compartment, she had 75% grade III chondromalacia medial femoral condyle, medial  tibial plateau which was thoroughly debrided. She had a tear of the posterior and medial horn of the medial meniscus of which 50% was resected back to a stable rim.  Intercondylar notch was inspected.  Anterior cruciate ligament showed mild laxity grade II but was otherwise intact.  Posterior cruciate was intact and stable.  Lateral compartment showed mild grade I to II chondromalacia.  Lateral meniscus showed a small partial tear 20% peripheral, or the inner surface, which was debrided back to a stable rim.  Patellofemoral joint showed mild grade I to II chondromalacia.  The patella tracked normally. Moderate synovitis in the medial and lateral gutters were debrided and then the bleeders were cauterized, otherwise they were free of pathology.  After this was done it was felt that all pathology had been satisfactorily addressed. The instruments were removed.  Portals closed with 3-0 nylon suture and injected with 0.25% Marcaine with epinephrine and 4 mg of morphine. Sterile dressing was applied.  The patient awakened and taken to the recovery room in stable condition.  FOLLOW-UP:  [name] will be followed as an outpatient on Vicodin and Bextra. See her back in the office in a week for sutures out and follow-up. Dictated by:   Elana Alm Thurston Hole, M.D. Attending Physician:  Twana First DD:  11/27/00 TD:  11/27/00 Job: [id number]

## 2010-07-23 NOTE — H&P (Signed)
NAME:  Kristen [name], Kristen [name]                       ACCOUNT NO.:  [id number]   MEDICAL RECORD NO.:  [id number]                   PATIENT TYPE:  INP   LOCATION:                                       FACILITY:  [facility]   PHYSICIAN:  Arturo Morton. Riley Kill, M.D. [facility]         DATE OF BIRTH:  12/28/1944   DATE OF ADMISSION:  10/22/2003  DATE OF DISCHARGE:                                HISTORY & PHYSICAL   REASON FOR ADMISSION:  The patient is a 65 year old female with known severe  single vessel coronary artery disease followed by Dr. Bonnee Quin who  presents to the office today for scheduled follow up.  She presents  complaining of  intermittent nausea with dry heaves over the past four weeks  or so.  Additionally, she has had upper chest tightness on occasion and,  this past Saturday, had an episode of severe, sharp chest pain.  This later  occurred while sitting; however, she states that she  has been experiencing  worsening dyspnea with any strenuous activity.  She has also had exertional  chest tightness.  However, the patient points out that she has GERD and is  on Prevacid.  She felt these symptoms may be related to her stomach.  She  has drank milk on occasion with some amelioration. However, her symptoms  have worsened over the past four weeks.  She was also recently instructed to  hold Aldactone for two days to see if this would help the nausea, but it did  not.  The patient states that these symptoms are different from those  preceding her previous percutaneous interventions or those prior to her  bypass surgery in February 2004.  However, she states that she currently  feels worse.  Of note, the patient has not taken any nitroglycerin during  these past few weeks.  Electrocardiogram here in the office reveals new T-  wave inversion in the anterolateral leads.  However, the previously noted  left bundle branch block is currently resolved.   ALLERGIES:  Sulfa, Talwin, Vibramycin, codeine,  and morphine.   CURRENT MEDICATIONS:  Aspirin 81 mg daily, Toprol XL 100 mg b.i.d., Lipitor  40 mg daily, Aldactone 25 mg daily, Flexeril 10 mg q.h.s., Xanax 0.5 q.i.d.,  Coumadin as directed, hydrochlorothiazide 12.5 mg daily, Avapro 300 mg  daily, Prevacid 30 mg b.i.d., Cymbalta 60 mg daily, K-Dur 20 mEq daily,  Provigil 200 daily, Vicodin 7.5, 4-5 times weekly.   PAST MEDICAL HISTORY:  1. Coronary artery disease.     A. Status post cutting balloon PTCA LAD April 2003 and July 2003.     B. One vessel CABG February 2004, LIMA to LAD.     C. Normal left ventricular function.     D. Normal Adenosine Cardiolite, EF 55% March 2005.  2. Gastroesophageal reflux disease.  3. Hypertension.  4. Dyslipidemia.  5. History of LVBB.  6. History of NSVT.  7. Status post  left temporal stroke.     A. Chronic Coumadin.  8. History of anxiety/depression.  9. Lower back surgery.  10.      History of tobacco.  11.      Status post cholecystectomy.   REVIEW OF SYMPTOMS:  The patient reports orthopnea with a single episode of  paroxysmal nocturnal dyspnea, progressive exertional dyspnea, no pedal  edema.  Recurrent heartburn.  No recent evidence of upper or lower GI  bleeding.  Of note, the patient has been under significant stress today upon  news that her daughter-in-law is en route to [facility] emergency room  with chest pain.  Otherwise, as noted per HPI.  Remaining systems negative.   SOCIAL HISTORY:  The patient has not smoked tobacco since April 2004.  She  denies alcohol use.   FAMILY HISTORY:  Father deceased at age 69, MI.  Mother deceased 66,  complications from stroke, history of CAD.  Sister deceased in her early 77s  from renal disease.  She has one remaining brother and sister, neither of  whom has coronary artery disease.   PHYSICAL EXAMINATION:  VITAL SIGNS:  Blood pressure 100/56, pulse 60 and regular, weight 210.  GENERAL:  65 year old female in moderate distress.   HEENT:  Normocephalic, atraumatic.  NECK:  Carotid pulses without bruits, no JVD.  LUNGS:  Clear to auscultation in all fields.  CHEST:  Moderate sternal tenderness with palpation of the midline.  HEART:  Regular rate and rhythm (S1 and S2), no significant murmurs.  No  rubs or clicks.  ABDOMEN:  Protuberant, nontender.  EXTREMITIES:  Preserved bilateral femoral pulses with no bruits, intact  distal pulses with no pedal edema.  NEUROLOGICAL:  Anxious, distress.   IMPRESSION:  65 year old female with a history of severe single vessel  coronary artery disease status post prior cutting balloon PCI of the LAD  with subsequent single vessel CABG (LIMA to LAD) in March 2004, who now  presents with refractory nausea, intermittent chest tightness, and abnormal  electrocardiogram suggestive of anterolateral ischemia.   PLAN:  The patient will be admitted directly to [facility] for  further evaluation and management.  Coumadin will be placed on hold and  intravenous  heparin will be started once the INR is less than or equal to  2.  A 2D echocardiogram will be scheduled for the morning.  Further  recommendations will be made pending results of cardiac markers and  echocardiography.  We will also request a GI consultation for the morning  for evaluation of nausea.  The patient is agreeable with this plan and is  willing to proceed.  The patient was seen and examined in conjunction with  Dr. Shawnie Pons.      Gene Serpe, P.A. [facility]                      Thomas D. Riley Kill, M.D. [facility]    GS/MEDQ  D:  10/22/2003  T:  10/22/2003  Job:  [id number]

## 2010-07-23 NOTE — Discharge Summary (Signed)
NAME:  Kristen [name], Kristen [name]                       ACCOUNT NO.:  [id number]   MEDICAL RECORD NO.:  [id number]                   PATIENT TYPE:  INP   LOCATION:  3712                                 FACILITY:  [facility]   PHYSICIAN:  Lavella Hammock, P.A. [facility]            DATE OF BIRTH:  12/28/1944   DATE OF ADMISSION:  10/24/2001  DATE OF DISCHARGE:  10/26/2001                           DISCHARGE SUMMARY - REFERRING   PROCEDURES:  1. Cardiac catheterization.  2. Coronary arteriogram.  3. Left ventriculogram.   HOSPITAL COURSE:  The patient is a 65 year old female with a history of PTCA  of the LAD in 4/03 as well as HSRA, PTCA of the LAD for restenosis in July.  She had recurrent chest pain and was seen in the office on 10/24/01 and  admitted from there for cardiac catheterization  and further management.   Her enzymes were negative for MI and she had a cardiac catheterization  performed on 10/26/01.  The cardiac catheterization  showed a 20% left to  main and a 30% mid LAD.  There was a 30-40% distal LAD.  The RCA and  circumflex systems had no significant disease. Her EF was greater than 50%.  It was felt that she had no critical coronary artery disease.   Near the end of the case, she had severe chest pain.  There was exquisite  tenderness in the left second costochondral junction consistent with  costochondritis.  It was felt that she could be continued on medical therapy  and take Flexeril for this.  An additional problem was hypokalemia.  As she  had been on Maxzide 25 mg q.d. as well as Joyce Gross Ciel 20 mEq q.d., but came in  with a potassium of 2.7.  The patient  stated she had been compliant taking  her potassium prior to admission.  The Maxzide and the potassium were both  held and she had Flexeril added to her medication regimen.  She was  considered stable for discharge on 10/26/01.   CHEST X-RAY:  No active cardiopulmonary disease.   LABORATORY VALUES:  Hemoglobin 13.3, hematocrit  39.6, WBC 6.8, platelets  345.  Sodium 140, potassium 3.7, chloride 111, CO2 23, BUN 19, creatinine  0.9, glucose 136.   DISCHARGE CONDITION:  Stable.   DISCHARGE DIAGNOSES:  1. Chest pain, possibly costochondritis.  2. Mild coronary artery disease by cath this admission.  3. Preserved left ventricular function.  4. Hypokalemia.  5. Hypertension.  6. ALLERGIES TO MORPHINE, SULFA, VIBRAMYCIN AND TALWIN.  7. Status post back surgery x3, hysterectomy, cholecystectomy.  8. History of depression.  9. Gastroesophageal reflux disease.  10.      Remote history of tobacco.  11.      History of left bundle branch block.  12.      Dyslipidemia.  13.      Family history of coronary artery disease.   DISCHARGE INSTRUCTIONS:  Her activity level was  to include no driving for  two days.  She is to call the office for problems with the cath site.  She  is to follow up with Dr. Riley Kill.  She is to follow up with Dr. Lovell Sheehan.   DISCHARGE MEDICATIONS:  1. Plavix 75 mg p.o. q.d.  2. Toprol XL 100 mg q.d.  3. Flexeril q.d.  4. Xanax p.r.n.  5. Cardizem CD 240 mg q.d.  6. Diazide and Kay Ciel are on hold.                                               Lavella Hammock, P.A. [facility]    RG/MEDQ  D:  12/14/2001  T:  12/15/2001  Job:  [id number]   cc:   Stacie Glaze, M.D. [facility]

## 2010-07-23 NOTE — Discharge Summary (Signed)
NAME:  Kristen [name], Kristen [name]                       ACCOUNT NO.:  [id number]   MEDICAL RECORD NO.:  [id number]                   PATIENT TYPE:  INP   LOCATION:  2002                                 FACILITY:  [facility]   PHYSICIAN:  Evelene Croon, M.D.                  DATE OF BIRTH:  12/28/1944   DATE OF ADMISSION:  05/03/2002  DATE OF DISCHARGE:  05/07/2002                                 DISCHARGE SUMMARY   PRIMARY ADMITTING DIAGNOSIS:  Severe single vessel coronary artery disease.   ADDITIONAL DISCHARGE DIAGNOSES:  1. Severe single vessel coronary artery disease.  2. Hypertension.  3. Hypercholesterolemia.  4. History of tobacco abuse.  5. History of chronic back problems status post 3 prior back surgeries.  6. History of gastroesophageal reflux.  7. History of depression.   PROCEDURES PERFORMED:  Coronary artery bypass grafting x1 (left internal  mammary artery to the left anterior descending).   HISTORY:  The patient is a 65 year old female with a history of coronary  artery disease, specifically LAD disease.  She underwent cutting balloon  angioplasty in April 2003. She developed a restenosis and subsequently  underwent repeat dilatation in July 2003.  She returned in August with  recurrent chest pain and underwent cardiac catheterization at that time  which showed widely patent angioplasty site. She was continued on medical  management and has done well until recently.  Over the past week or so, she  has had recurrent episodes of severe substernal chest pain which lasted 4-5  minutes each and have been resolved with sublingual nitroglycerin.   She underwent repeat cardiac catheter and was found to have 75% segmental  restenosis of the previous angioplasty site just beyond the large second  diagonal branch.  The left circumflex and the right coronaries had no  significant disease.  It was felt, at this time, that her best option was to  proceed with surgical intervention.  She  was referred to Dr. Evelene Croon  and was later scheduled for surgery.   HOSPITAL COURSE:  She was admitted on 05/03/2002 and was taken the operating  room where she underwent CABG x1 as noted above.  Initially an attempt was  made to proceed with off-pump __________ grafting, but because of difficulty  exposing the LAD and some intraoperative blood pressure problems the  decision was made to proceed with cardiopulmonary bypass.  She tolerated the  procedure well and was transferred to the SICU in stable condition.  She was  extubated shortly after surgery.  She was hemodynamically stable and doing  well postoperative day #1.  At that time she was able to be transferred to  the floor.  Her chest tubes were removed and she was mobilized.  Postoperatively she has done well.  She has been ambulating in the halls  with cardiac rehab phase 1 and is doing quite well.  Her surgical incision  site is  healing well.  She has been restarted on most of her home  medications.  She has been diuresed back down to her preoperative weight.  She has remained in normal sinus rhythm and has been afebrile, and other  vital signs have been stable. It was felt that if she continues to remain  stable and improved she will be ready for discharge home on 05/07/2002.   DISCHARGE MEDICATIONS:  1. Enteric coated aspirin 325 mg daily.  2. Lopressor 25 mg b.i.d.  3. Ultram 50-100 mg q.3-4h. p.r.n. for pain.  4. Protonix 40 mg daily.  5. Lipitor 20 mg q.h.s.  6. Wellbutrin 300 mg daily.  7. Xanax 0.5 mg q.i.d. p.r.n.  8. Flexeril 10 mg q.h.s.   DISCHARGE INSTRUCTIONS:  1. She is to refrain from driving, heavy lifting or strenuous activity.  2. She may continue daily use of her incentive spirometry and is encouraged     to walk regularly.  3. She will also be encouraged to pursue with the outpatient cardiac rehab     program.  4. Diet:  She is to continue a low fat, low sodium diet.  5. Wound care:  She may  shower daily and clean her incision sites with soap     and water.  6. Discharge/Follow up:  An appointment will be scheduled for her to see Dr.     Riley Kill, in 2 weeks and have a chest x-ray at that visit.  She will then     follow up with Dr. Laneta Simmers on Tuesday, 05/28/2002 at 11:15 a.m.  She will     bring her chest x-ray to this visit.  She is asked to call our office if     she experiences any increased redness, swelling, or drainage to the     incision sites.  Fever greater than 101, chest pain, shortness of breath,     or any other abnormal symptoms.     Coral Ceo, P.A.                        Evelene Croon, M.D.    GC/MEDQ  D:  05/06/2002  T:  05/06/2002  Job:  [id number]   cc:   Arturo Morton. Riley Kill, M.D. [facility]   Dr. Lovell Sheehan

## 2010-07-23 NOTE — Cardiovascular Report (Signed)
NAME:  Kristen [name], Kristen [name]                       ACCOUNT NO.:  [id number]   MEDICAL RECORD NO.:  [id number]                   PATIENT TYPE:  INP   LOCATION:  6525                                 FACILITY:  [facility]   PHYSICIAN:  Veneda Melter, M.D. [facility]               DATE OF BIRTH:  12/28/1944   DATE OF PROCEDURE:  10/02/2001  DATE OF DISCHARGE:  10/03/2001                              CARDIAC CATHETERIZATION   PROCEDURE:  Percutaneous transluminal coronary angioplasty of the mid-left  anterior descending coronary artery.   DIAGNOSES:  1. Single-vessel coronary artery disease.  2. Restenosis of the mid-left anterior descending coronary artery status     post percutaneous transluminal coronary angioplasty.   HISTORY:  The patient is a 65 year old black female with a history of  coronary artery disease, who has previously undergone percutaneous  intervention to the mid-LAD in 4/03 for severe narrowing of 80-90% just  distal to the takeoff of the first diagonal branch.  She had well-preserved  LV function.  She had done well in the interim, however now presents with  recurrence of chest discomfort prompting admission to the hospital.  She was  stabilized medically and underwent cardiac catheterization by Theron Arista C.  Eden Emms, M.D.  She had restenosis of the mid-LAD with 80% narrowing.  She  presents for percutaneous intervention.   DESCRIPTION OF PROCEDURE:  Informed consent had been obtained.  The patient  was brought to the catheterization lab and the existing 6 French sheath was  exchanged for a 7 French sheath in the right groin.  The patient had been  pretreated with aspirin and Plavix and was given heparin to maintain an ACT  of greater than 250 seconds.  A 7 [place] Voda left 3.5 guide catheter was  used to engage the left coronary artery and selective guide shots obtained.  This confirmed the presence of a tubular narrowing of 80% in the mid-LAD  after the first diagonal branch.  There  was further narrowing of 50%  distally in a bend in the vessel.  A 0.014 inch floppy wire was advanced in  the distal LAD and a second floppy wire positioned in the diagonal branch  for protection.  A 2.75 x 15 mm cutting balloon was introduced and two  inflations performed in the mid-LAD at six atmospheres for 60 seconds.  Repeat angiography showed an excellent result with less than 10% of residual  narrowing.  There was no evidence of vessel damage or compromise of the  diagonal branch and TIMI 3 flow through the LAD diagonal branch.  The  patient had chest discomfort with balloon inflations reminiscent of pain on  presentation.  These resolved with balloon deflations.  Final angiography  was performed confirming these findings.  The guide catheter was then  removed and the sheath was returned to position and the patient was  transferred to the holding area, where the sheath can be removed  when the  ACT returns to normal.  She tolerated the procedure well.   FINAL RESULT:  Successful PTCA of the mid-LAD with reduction of 80%  restenosis status post balloon angioplasty to less than 10% using a 2.75 mm  cutting balloon.    ASSESSMENT AND PLAN:  The patient is a 65 year old female with single-vessel  coronary artery disease, who has undergone percutaneous intervention for  restenosis.  Should the patient have recurrent disease in this segment,  consideration may be given toward percutaneous stent placement or adjuvant  brachytherapy.                                                Veneda Melter, M.D. [facility]    NG/MEDQ  D:  10/02/2001  T:  10/08/2001  Job:  [id number]   cc:   Stacie Glaze, M.D. [facility]   Maisie Fus D. Riley Kill, M.D. [facility]

## 2010-07-23 NOTE — Procedures (Signed)
NAME:  Kristen [name], Kristen [name] NO.:  [id number]   MEDICAL RECORD NO.:  [id number]          PATIENT TYPE:  OUT   LOCATION:  SLEEP CENTER                 FACILITY:  [facility]   PHYSICIAN:  Marcelyn Bruins, M.D. [facility] DATE OF BIRTH:  12/28/1944   DATE OF STUDY:  06/23/2005                              NOCTURNAL POLYSOMNOGRAM   REFERRING PHYSICIAN:  Arturo Morton. Riley Kill, M.D.   INDICATIONS:  Studies hypersomnia with sleep apnea   EPWORTH SLEEPINESS SCORE:  4.   SLEEP ARCHITECTURE:  The patient had a total sleep time of 363 minutes with  decreased REM and never achieved slow wave sleep.  Sleep onset latency was  normal at 18 minutes and REM onset was very prolonged at 295 minutes.  Sleep  efficiency was only 78%.  It should be noted the patient has chronic back  pain and took narcotic medication prior to the sleep study.  She is unable  to sleep on her back because of back pain.   RESPIRATORY DATA:  The patient was found to have 62 hypopneas and 18 apneas  for a respiratory disturbance index of 13 events per hour.  Events were not  positional but they were clearly worse during REM.  There was moderate to  severe snoring noted throughout.  The patient did not meet split night  criteria secondary to the small numbers of her events.   OXYGEN DATA:  There was O2 desaturation as low as 84% with the patient's  obstructive events.   CARDIAC:  There was no clinically significant cardiac arrhythmias.   MOVEMENT/PARASOMNIAS:  The patient was found to have rare numbers of leg  jerks that were not clinically significant.   IMPRESSION/RECOMMENDATIONS:  1.  Mild obstructive sleep apnea/hypopnea syndrome with a respiratory      disturbance index of 30 events per hour and O2 desaturation as low as      84%.  The patient did not meet split night criteria secondary to the      small numbers of events.  Treatment for this degree of sleep apnea may      include weight loss alone if applicable,  upper airway surgery, oral appliance, and also CPAP.  The decision to      treat this degree of sleep apnea should be based on the patient's      quality of life as well as her lifestyle.           ______________________________  Marcelyn Bruins, M.D. [facility]  Diplomate, American Board of Sleep  Medicine     KC/MEDQ  D:  07/07/2005 11:13:40  T:  07/08/2005 09:13:33  Job:  [id number]

## 2010-07-23 NOTE — Discharge Summary (Signed)
NAME:  Kristen [name], Kristen [name]                       ACCOUNT NO.:  [id number]   MEDICAL RECORD NO.:  [id number]                   PATIENT TYPE:  INP   LOCATION:  3019                                 FACILITY:  [facility]   PHYSICIAN:  Santina Evans A. Orlin Hilding, M.D.          DATE OF BIRTH:  12/28/1944   DATE OF ADMISSION:  07/12/2002  DATE OF DISCHARGE:  07/17/2002                                 DISCHARGE SUMMARY   DISCHARGE DIAGNOSES:  1. Left temporal infarction.  2. Left ventricular dysfunction with ejection fraction 25 to 30%.  3. Coronary artery disease status post coronary artery bypass grafting     05/03/2002 by Dr. Laneta Simmers.  4. Hypertension.  5. Dyslipidemia.  6. Obesity.  7. Status post lumbar laminectomy.  8. Gastroesophageal reflux disease.  9. Chronic left bundle branch block.  10.      Status post cholecystectomy.  11.      Status post hysterectomy.   DISCHARGE MEDICATIONS:  1. Coumadin 2.5 mg May 12 and May 13, then to be determined.  2. Aspirin 81 mg daily.  3. Altace 2.5 mg daily.  4. Toprol 100 mg XL b.i.d.  5. Lipitor 20 mg daily.  6. Aldactone 25 mg b.i.d.  7. Tylenol 1 to 2 tablets q.4-6h. p.r.n.   STUDIES:  1. CT of the brain on admission notable for acute abnormalities.  2. MRI of the brain shows acute left posterior temporal and left parietal     infarction.  3. MRA of the brain is normal.  4. A 2-D echocardiogram shows ejection fraction of 20 to 30% with akinesis     of the entire inferior wall, apical septal wall, periapical wall.  Mild     mitral valve regurgitation.  Apex not well visualized and cannot exclude     thrombus.  5. Carotid Doppler showed no ICA stenosis, and vertebral arteries flow     antegrade bilaterally.  6. Transesophageal echocardiogram performed 07/15/1999 by [facility]     without embolus.  No PFO.  LV function severely depressed at 25 to 30%.     Mid distal anterior wall and apex hypokinetic to akinetic.  Left     ventricle  hypokinesis.  No thrombus seen.  Thoracic aorta had minimally     thick atherosclerotic plaque.  Mild MR, TR.  Otherwise valve normal.  No     complications during procedure.   LABORATORY DATA:  INR on discharge is 2.1.  CBC normal with white blood  cells 6.5, hemoglobin 14.1, hematocrit 41.9, platelets 300, MCV 93.7, MCHC  33.6, RDW 13.4.  Chemistries normal.  Cardiac enzymes negative.  Cholesterol  99, triglycerides 104, HDL 31, LDL 47.  UA negative.  Homocysteine normal.   HISTORY OF PRESENT ILLNESS:  The patient is a 65 year old, right-handed  black female with a 35-year history of hypertension and a 1-year history of  documented coronary artery disease who underwent single-vessel coronary  artery bypass  in 04/2002.  She had been cardiac rehabilitation since that  time and this morning awoke with complaints of left-sided headache involving  her left eye and left frontal region.  She took capsule and went to bed but  then awoke and noted slurred speech.  She talked with her husband by phone  and also was noted at cardiac rehabilitation to have difficulty with slurred  speech.  She presented to [facility] for evaluation.  There  was no associated chest pain.  She was on aspirin prior to admission.  She  was not a PTA candidate secondary to time and limited severity of symptoms.   HOSPITAL COURSE:  The patient was admitted to the hospital for further  evaluation.  MRA did reveal an acute left anterior temporal/posterior  temporal infarction, a surprise.  Her 2-D echocardiogram showed ejection  fraction 20 to 30%.  This was apparently normal in 04/2002 when she had  surgery.  She does not recollect having any chest pain since surgery.  Cardiology was consulted.   A TEE was done to rule out cardiac thrombus.  I would suspect that the  stroke was caused by low ejection fraction and recommendations are for  Coumadin and low-dose aspirin for secondary stroke prevention.   Related to  her heart, medications adjustments were made in hospital, and Dr. Riley Kill  will follow up as an outpatient.   Speech therapy did evaluate the patient in house and felt speech therapy was  warranted.  The patient had no other physical deficits requiring therapy.   Other risk factors for stroke identified were hypertension which is under  control, dyslipidemia for which she is on Lipitor 20 mg daily at home prior  to admission, and obesity.  Recommendations were for weight loss and  exercise as tolerated under the advisement of her cardiologist.   CONDITION ON DISCHARGE:  Alert and oriented x 3.  Speech clear but remains  hesitant at times.  She has some word-finding difficulties.  She can name.  She cannot repeat.  She has no facial weakness.  She has no arm drift.  Strength is normal.  Gait is steady.  Chest is clear to auscultation.  Heart  rate is regular.  She is able to follow commands.  Cerebellar is intact.   DISCHARGE INSTRUCTIONS:  1. Discharge home with continuation of outpatient speech therapy.  2. Coumadin and aspirin 81 mg for secondary stroke prevention.  She will     follow up at the Coumadin clinic on Friday for her next INR check.  3. Follow up with Dr. Orlin Hilding at [facility] in one month.  4. Follow up with cardiology as recommended.     Annie Main, N.P.                         Catherine A. Orlin Hilding, M.D.    SB/MEDQ  D:  07/17/2002  T:  07/18/2002  Job:  [id number]   cc:   Stacie Glaze, M.D. [facility]   Maisie Fus D. Riley Kill, M.D.

## 2010-07-23 NOTE — Cardiovascular Report (Signed)
NAME:  Kristen [name], Kristen [name]                       ACCOUNT NO.:  [id number]   MEDICAL RECORD NO.:  [id number]                   PATIENT TYPE:  INP   LOCATION:  2016                                 FACILITY:  [facility]   PHYSICIAN:  Arturo Morton. Riley Kill, M.D. [facility]         DATE OF BIRTH:  12/28/1944   DATE OF PROCEDURE:  10/24/2003  DATE OF DISCHARGE:  10/25/2003                              CARDIAC CATHETERIZATION   INDICATIONS:  [name] is a 65 year old female well-known to me.  She has  had previous coronary artery disease with stenting and subsequent cutting  balloon angioplasty, she ultimately had an internal mammary to the distal  left anterior descending.  Following this she later had a stroke.  Transesophageal echocardiogram was done and did not demonstrate a shunt.  She did have a wall motion abnormality involving the apex.  There was not  definite thrombus seen.  She has been treated with Coumadin since that time.  She has not had recurrent cerebrovascular accident.  Recently she has had  some unexplained nausea.  Upper endoscopy was done one day earlier which did  not demonstrate a definite focus, some question has been raised as to  whether the Provigil was the cause.  She has also had a chronic left bundle,  but when she was seen in the office did not have significant QRS widening  and did have some T wave inversion in the anterior precordial leads.  With  this, it was felt that repeat catheterization would be indicated to document  the status of her left anterior descending.   She was agreeable to this approach.   PROCEDURE:  1. Selective coronary arteriography.  2. Selective left internal mammary angiography.   DESCRIPTION OF PROCEDURE:  The patient was brought to the cath lab and  prepped and draped in the usual fashion.  Using a smart needle anterior wall  of the femoral artery was entered.  A 5 French sheath was actually placed.  Diagnostic coronary arteriograms were done  with a JL35 catheter, and a  standard right catheter.  There was mild coronary spasm in the right ostium  with engagement, but there did not appear to be critical disease.   The patient tolerated the procedure well and was taken to the holding area  in satisfactory clinical condition.   ANGIOGRAPHIC DATA:  1. The left main coronary artery was free of critical disease.  2. The LAD courses to the apex, there is a 60 to 70% probably at its worse,     lesion in the LAD after the takeoff of the diagonal.  Importantly, we     compared the studies to the previous studies of 2004, and the native LAD     actually appears improved, the diagonal itself was widely patent.  3. The internal mammary of the distal LAD is patent.  It is somewhat under     filled due to competitive filling, but there does  not appear to be a     definite area of narrowing although a small caliber makes it difficult to     be absolutely sure.  4. The circumflex has a proximal marginal branch and then two subsequent     distal margin branches that appears free of significant disease.  5. The ostium of the right coronary artery demonstrates some spasm with     engagement, the remainder of the right coronary ascending was smooth down     to the posterior descending and posterolateral system both of which are     free of critical disease.   CONCLUSIONS:  1. Continued patency of the internal mammary with evidence of competitive     filling.  2. Lesion of the left anterior descending just past the origin of the     diagonal with some improvement in appearance from the previous study of     note the patient does have an LDL of 57.   DISPOSITION:  We will restart the patient's Coumadin.  Based upon  gastroenterology's evaluation and recommendations we will stop her Provigil.  She will need long term followup with Dr. Lovell Sheehan, and with Dr. Riley Kill.                                               Arturo Morton. Riley Kill, M.D.  [facility]    TDS/MEDQ  D:  10/24/2003  T:  10/25/2003  Job:  [id number]   cc:   Stacie Glaze, M.D. [facility]   Wilhemina Bonito. Marina Goodell, M.D. [facility]

## 2010-07-23 NOTE — Letter (Signed)
February 21, 2006    Stacie Glaze, MD  [address] [place], [place] [address]   RE:  Kristen, [name]  MRN:  [id number]  /  DOB:  12/28/1944   Dear Jonny Ruiz:   I had the pleasure of seeing your nice patient, Kristen [name] in the office  today.  In general, she has been stable, although she has had a little  bit of shortness of breath especially when she goes upstairs.  She fell  down the stairs and has not been particularly active recently because of  bumping her knee, and she has not a lot of chest discomfort.  Importantly, she had a radionuclide imaging study in April 2007, which  did not suggest significant ischemia.  She has also had, as you know, a  stroke and has been on Warfarin anticoagulation.  I therefore am not too  keen to recommend a cardiac catheterization.  Her ejection fraction is  well controlled.   PHYSICAL EXAMINATION:  VITAL SIGNS:  Today on examination, the weight is  216 - down 10 pounds in a year.  The blood pressure is 110/70.  The  pulse is 90.  LUNGS:  Fields are clear.  CARDIAC:  Rhythm reveals a paradoxical second heart sound.  EXTREMITIES:  Do not reveal significant edema.   The electrocardiogram demonstrates a normal sinus rhythm with a left  bundle orientation.   Overall, I suspect Kristen [name] is stable.  She has not had lab work since  July 2007, and we are going to get a CBC and a B-MET since she is on a  complicated regimen.  I have asked her to followup with you closely.  We  will see her in the cardiology clinic in followup in 4 months.    Sincerely,      Arturo Morton. Riley Kill, MD, [facility]  Electronically Signed    TDS/MedQ  DD: 02/21/2006  DT: 02/21/2006  Job #: [id number]

## 2010-07-23 NOTE — Cardiovascular Report (Signed)
Kristen [name], GLASSBERG                         ACCOUNT NO.:  [id number]   MEDICAL RECORD NO.:  [id number]                   PATIENT TYPE:  OIB   LOCATION:                                       FACILITY:   PHYSICIAN:  Arturo Morton. Riley Kill, M.D. [facility]         DATE OF BIRTH:  12/28/1944   DATE OF PROCEDURE:  04/19/2002  DATE OF DISCHARGE:                              CARDIAC CATHETERIZATION   INDICATIONS:  .  The patient is well known to me.  She is a 65 year old female who has had  prior percutaneous intervention with Cutting Balloon angioplasty of the LAD  just beyond the second diagonal.  Two cutting Balloon procedures have been  done in April of 2003 and in July of 2003.  She recently has had some  recurrent symptoms and I discussed with her the options and thought it would  be best to bring her back to the lab for further evaluation.  Risks,  benefits and alternatives were discussed with the patient.   PROCEDURES:  1. Left heart catheterization.  2. Selective coronary arteriography.  3. Selective left ventriculography.  4. Subclavian angiography.   DESCRIPTION OF PROCEDURE:  The procedure was performed from the left femoral  artery using 6 French catheters.  She tolerated the procedure without major  complications. She was taken to the holding area in satisfactory clinical  condition. I then discussed the case with Dr. Evelene Croon.   HEMODYNAMIC DATA:  1. Central aortic pressure 127/81.  2. Left ventricular 141/12.  3. No gradient noted on pullback across the aortic valve.   ANGIOGRAPHIC DATA:  1. Ventriculography was performed in the RAO projection.  Overall systolic     function was well preserved.  Because of ventricular ectopy an ejection     fraction could not be calculated except on a post PVC beat.  It clearly     was in excess of 50%.  2. The left main coronary artery is free of critical disease.  3. The LAD courses to the apex. It provides a smaller first diagonal  and a     moderate sized first septal perforator.  Just beyond a very large second     diagonal, there is a segmental area of about 75% narrowing that     constitutes the previous percutaneous intervention site with cutting     Balloon angioplasty.  More distally, there was a 30% area of narrowing.     The distal vessel was a fairly large caliber vessel that supplies a good     portion of the apex.  4. The circumflex provides two marginal branches and does appear to be free     of critical disease.  5. The right coronary artery is a dominant vessel. It appears smooth     throughout its course providing a posterior descending and posterolateral     branch.   CONCLUSIONS:  1. Well preserved left ventricular systolic  function.  2. Stenosis of the left anterior descending artery as described above.   DISPOSITION:  I plan to get the cardiovascular surgeons to review the films.  She has had two cutting Balloon angioplasties.  The limitation of stenting  in her case was that the diagonal is a fairly large vessel and more than  likely would be compromised by an attempt to open the native artery. While  the parent artery could be opened, and the side branch dilated as well, the  diagonal itself is a 2.5 mm artery making good patency of both vessels a  difficult task.  I will therefore obtain a cardiac surgical consult with  consideration of whether she might be a candidate for a mid __________.                                                    Arturo Morton. Riley Kill, M.D. [facility]    TDS/MEDQ  D:  04/19/2002  T:  04/20/2002  Job:  [id number]   cc:   [facility] Laboratory

## 2010-07-23 NOTE — Assessment & Plan Note (Signed)
[facility]                              CARDIOLOGY OFFICE NOTE   TERRELL, SHIMKO                 MRN:          [id number]  DATE:09/27/2005                            DOB:          12/28/1944    Kristen [name] is back in for a followup.  From a cardiac standpoint, she has been  reasonably stable.  She has had some discomfort in her neck.  Her Actos has  been replaced by another agent, which is Engineer, petroleum.  She has remained on  Crestor, as well as low-dose Aldactone.  She has not been having any ongoing  chest pain.   PHYSICAL EXAMINATION:  VITAL SIGNS:  Her weight is 224 pounds, blood  pressure 122/67 and pulse of 76.  LUNGS:  The lung fields are clear.  HEART:  There is a second heart sounds which is paradoxically split.   ELECTROCARDIOGRAM:  EKG revealed normal sinus rhythm with left bundle branch  block.   LABORATORY DATA:  Basic metabolic profile reveals a BUN of 16, creatinine  1.0 and potassium 4.5 with a GFR of 73.   The patient remains stable from a cardiac standpoint.  Her blood pressure is  reasonably controlled.  She needs to continue to be seen on an every 3 month  basis, and I will continue to follow her closely.  She has had improvement  in her overall left ventricular function.  She has had a CVA, and she has  been treated for hyperlipidemia.  Her hypertension seems to be well  controlled at the present time.  I will see her back in followup in 3 months  time.                              Kristen Morton. Riley Kill, MD, [facility]    TDS/MedQ  DD:  10/17/2005  DT:  10/17/2005  Job #:  [id number]

## 2010-07-23 NOTE — Cardiovascular Report (Signed)
[place]  Patient:    Kristen [name], Kristen [name] Visit Number: [id number] MRN: [id number]          Service Type: MED Location: [phone] Attending Physician:  Ronaldo Miyamoto Dictated by:   Daisey Must, M.D. [facility] Proc. Date: 06/08/01 Admit Date:  06/07/2001 Discharge Date: 06/09/2001   CC:         Stacie Glaze, M.D. [facility]  Maisie Fus D. Riley Kill, M.D. [facility]  Cardia Cath Lab   Cardiac Catheterization  PROCEDURES PERFORMED: 1. Left heart catheterization with coronary angiography and left    ventriculography. 2. Percutaneous transluminal coronary angioplasty utilizing a cutting    balloon of the mid left anterior descending artery.  INDICATION:  Ms Kristen [name] is a 65 year old woman who has had progressive chest pain now compatible with unstable angina.  She had a stress cardiolite done recently which showed evidence of anterior wall infarct and/or ischemia with an ejection fraction of 50%.  She was seen in the office yesterday and admitted for cardiac catheterization.  CATHETERIZATION PROCEDURE:  A 6-French was placed in the right femoral artery and standard Judkins 6-French catheres were utilized.  Contrast was Omnipaque. There were no complications.  HEMODYNAMIC RESULTS: 1.  Left ventricular pressure 130/16, aortic pressure 130/78.  There was no     added gradient. 2.  Left ventriculogram: There was moderate hypokinesis of the anterior apical     wall.  Ejection fraction was calculated at 50%.  There was trace mitral     regurgitation. 3.  Coronary arteriography (right dominant):  Left main is normal. 4.  Left anterior descending artery has a tubular 75% stenosis in the mid     vessel beginning just after the origin of a normal-sized first     diagonal branch.  Further down in the mid vessel is a 40% stenosis.     The distal vessel has diffuse luminal irregularities.  The LAD gives     rise to a single normal-sized diagonal branch which  has a 25% stenosis. 5.  Left circumflex gives rise to a normal-sized OM1, normal-sized OM2, and     a small OM3.  The left circumflex is normal. 6.  Right coronary artery gives rise to a normal-sized posterior descending     artery, and a small posterolateral branch.  There are minor luminal     irregularities in the proximal right coronary artery.  IMPRESSIONS: 1. Mildly decreased left ventricular systolic function with anterior apical    wall motion abnormalities as described. 2. One vessel coronary artery disease involving the left anterior descending    artery.  The culprit appears to be the 75% stenosis in the mid LAD.  PLAN:  Percutaneous intervention to the LAD, see below.  PERCUTANEOUS TRANSLUMINAL CORONARY ANGIOPLASTY PROCEDURAL NOTE:  Following completion of the diagnostic catheterization, we proceeded with percutaneous coronary intervention.  The preexisting 6-French sheath in the right femoral artery was exchanged for a 7-French sheath.  Heparin and Integrilin were administered per protocol.  We used a 7-French JL-4 Guidant catheter.  A DMW wire was advanced under fluoroscopic guidance into the distal LAD.  We also advanced a floppy wire into the first diagonal branch.  We then performed PTCA of the mid LAD utilizing a 2.25 x 15 mm cutting balloon inflated to 6,10, and then 10 atmospheres sequentially.  We then went back with a 2.5 x 15 mm Quantum balloon in the mid LAD for two inflations, each at 12 atmospheres. Final  angiographic images revealed patency of the LAD with 20% residual stenosis and TIMI-3 flow.  COMPLICATIONS:  None.  RESULTS:  Successful PTCA of the mid left anterior descending artery utilizing the cutting balloon.  A 75% stenosis was reduced to 20% residual with TIMI-3 flow.  PLAN: 1. Integrilin will be continued for 18 hours. 2. Plavix will be administered for six months because of the patients    presentation with acute coronary syndrome. Dictated  by:   Daisey Must, M.D. [facility] Attending Physician:  Ronaldo Miyamoto DD:  06/08/01 TD:  06/11/01 Job: [id number]

## 2010-07-23 NOTE — Op Note (Signed)
NAME:  Kristen [name], Kristen [name]                       ACCOUNT NO.:  [id number]   MEDICAL RECORD NO.:  [id number]                   PATIENT TYPE:  INP   LOCATION:  2316                                 FACILITY:  [facility]   PHYSICIAN:  Evelene Croon, M.D.                  DATE OF BIRTH:  12/28/1944   DATE OF PROCEDURE:  05/03/2002  DATE OF DISCHARGE:                                 OPERATIVE REPORT   PREOPERATIVE DIAGNOSIS:  Severe single vessel coronary artery disease.   POSTOPERATIVE DIAGNOSIS:  Severe single vessel coronary artery disease.   PROCEDURE:  Median sternotomy, extracorporeal circulation, coronary artery  bypass graft surgery x1 using a left internal mammary artery graft to the  left anterior descending coronary artery.   SURGEON:  Evelene Croon, M.D.   ASSISTANT:  Eber Hong, P.A.   ANESTHESIA:  General endotracheal.   INDICATIONS FOR PROCEDURE:  This patient is a 65 year old woman with a  history of coronary artery disease of the LAD, status post cutting balloon  angioplasty in April of 2003.  She has restenosis and subsequent repeat  dilatation in July of 2003.  She subsequently had further chest pain in  August and repeat catheterization showed a widely patent angioplasty site.  Recently, she returned with recurrent chest pain.  She underwent cardiac  catheterization which showed 75% segmental restenosis of the previous  percutaneous intervention site just beyond the large second diagonal branch.  The left circumflex and right coronary arteries had no significant disease.  Left ventricular ejection fraction was greater than 50%. After review of the  angiogram and the examination of the patient, it was felt that coronary  artery bypass graft with a left internal mammary artery to the LAD was the  best treatment for this patient.  I discussed the operative procedure with  her and her husband including possible off-pump coronary artery bypass graft  surgery if  feasible.  I discussed alternatives, benefits, and risks  including bleeding, blood transfusion, infection, stroke, graft failure,  myocardial infarction, and death.  They understood and agreed to proceed.   DESCRIPTION OF PROCEDURE:  The patient was taken to the operating room and  placed on the table in the supine position. After induction of general  endotracheal anesthesia, a Foley catheter was placed in the bladder using  sterile technique.  Then the chest, abdomen, and both lower extremities were  prepped and draped in the usual sterile fashion.  The chest was entered  through a median sternotomy incision and the pericardium opened in the  midline.  Examination of the heart showed good ventricular contractility.  The ascending aorta had no palpable plaques in it.   Then the left internal mammary artery was harvested from the chest wall as a  pedicle graft. This was a medium caliber vessel with excellent blood flow  through it.   Then the off-pump retractor was placed and the LAD  was exposed with a lot of  difficulty.  Due to her obesity and deep short chest, it was difficult to  expose the LAD by retracting the heart. Her blood pressure did not tolerate  that very well.  The LAD was relatively fragile vessel and she had  relatively large lungs.  I thought it would be difficult to do this surgery  off-pump and would not give an optimal anastomosis.  Therefore, plans were  made to perform the surgery using cardiopulmonary bypass.  The patient was  heparinized and when an adequate activated clotting time was achieved, the  distal ascending aorta was cannulated using a 20 French aortic cannula for  arterial inflow. Venous outflow was achieved using a two-stage venous  cannula through the right atrial appendage. An antegrade cardioplegia and  vent cannula was inserted into the aortic root.  The patient was placed on  cardiopulmonary bypass.  The aorta was crossclamped and 500 mL of cold  blood  antegrade cardioplegia was administered into the aortic root with quick  arrest of the heart.  The patient's body temperature was kept normothermic.  Insulating pad was placed in the pericardium.  Topical cooling of the heart  with iced saline was used.  Then a single distal anastomosis was performed  to the midportion of the left anterior descending coronary artery.  The  internal diameter of this vessel was about 1.75 mm.  The conduit used was  the left internal mammary artery graft and this was brought through an  opening in the left pericardium anterior to the phrenic nerve. It was  anastomosed to the LAD in an end-to-side manner using continuous 8-0 Prolene  suture.  Then the clamp was removed from the mammary pedicle. There was  rapid rewarming of the ventricular septum and return of spontaneous  ventricular fibrillation.  The crossclamp was removed with a time of 16  minutes and the patient defibrillated into sinus rhythm.  The distal  anastomosis appeared hemostatic.  Two temporary right ventricular and right  atrial pacing wires were brought out through the skin.   Then the patient was weaned from cardiopulmonary bypass on no inotropic  agents.  Cardiac function appeared excellent with cardiac output of 4 liters  per minute. Protamine was given and the venous and aortic cannuli were  removed without difficulty.  Hemostasis was achieved.  Three chest tubes  were placed with two in the posterior pericardium, one in the left pleural  space, and one in the anterior mediastinum. The pericardium was loosely  reapproximated over the heart. The sternum was closed with a #6 stainless  steel wires.  The fascia was closed with continuous #1 Vicryl suture.  Subcutaneous tissue was closed with continuous 2-0 Vicryl and the skin with  3-0 Vicryl subcuticular closure.  Needle, sponge, and instrument count correct according to the scrub nurse.  Dry sterile dressings were applied  over the  incisions and around the chest tubes which were hooked to Pleurovac  suctioned. The patient remained hemodynamically stable and was transported  to the SICU in guarded, but stable condition.                                               Evelene Croon, M.D.    BB/MEDQ  D:  05/03/2002  T:  05/03/2002  Job:  [id number]   cc:  Arturo Morton. Riley Kill, M.D. [facility]

## 2010-07-23 NOTE — Op Note (Signed)
[name], [name]             ACCOUNT NO.:  [id number]   MEDICAL RECORD NO.:  [id number]          PATIENT TYPE:  OIB   LOCATION:  2910                         FACILITY:  [facility]   PHYSICIAN:  Elana Alm. Thurston Hole, M.D. DATE OF BIRTH:  12/28/1944   DATE OF PROCEDURE:  02/17/2004  DATE OF DISCHARGE:                                 OPERATIVE REPORT   PREOPERATIVE DIAGNOSIS:  Right knee medial and lateral meniscal tears with  chondromalacia and synovitis.   POSTOPERATIVE DIAGNOSIS:  Right knee medial and lateral meniscal tears with  chondromalacia and synovitis.   PROCEDURE:  1.  Right knee exam under anesthesia followed by arthroscopic partial medial      and lateral meniscectomies.  2.  Left knee chondroplasty with partial synovectomy.   SURGEON:  Elana Alm. Thurston Hole, M.D.   ASSISTANT:  Julien Girt, P.A.-C.   ANESTHESIA:  General.   OPERATIVE TIME:  30 minutes.   COMPLICATIONS:  None.   INDICATIONS FOR PROCEDURE:  Kristen [name] is a 65 year old woman who injured  her right knee at work on November 10, 2003, twisting this.  Exam and MRI  has revealed a meniscus tear with chondromalacia and synovitis.  She has  failed conservative care and is now to undergo arthroscopy.   DESCRIPTION OF PROCEDURE:  [name] was brought to the operating room on  February 17, 2004, and placed on the operating table in supine position.  After an adequate level of general anesthesia was obtained, her right knee  was examined under anesthesia.  Range of motion from 0 to 130 degrees, 1-2+  crepitation, stable to ligamentous exam with normal patellar tracking.  The  knee was sterilely injected with 0.25% Marcaine with epinephrine.  The right  leg was prepped with DuraPrep and draped using sterile technique.  Originally through an anterolateral portal, the arthroscope with a pump  attachment was placed and through an anteromedial portal, an arthroscopic  probe was placed.  On initial inspection of  the medial compartment, she was  found to have 50-60% grade 3 chondromalacia which was debrided.  She had  tearing of the posterior and medial horn of the medial meniscus of which 50-  60% was resected back to a stable rim.  The intercondylar notch was  inspected and the anterior and posterior cruciate ligaments were normal.  The lateral compartment was inspected, 30% grade 3 which was debrided.  Lateral meniscus tear 25-30% posterolateral corner which was resected back  to a stable rim.  The patellofemoral joint showed 30% grade 3 chondromalacia  which was debrided.  The patella tracked normally.  Moderate synovitis in  the medial and lateral gutters were debrided, otherwise, they were free of  pathology.  After this was done, it was felt that all pathology had been  satisfactorily addressed.  The instruments were removed.  The portals were  closed with a 3-0 nylon suture.  The knee was injected with 0.25% Marcaine  with epinephrine and 4 mg morphine.  Sterile dressings were applied.  The  patient was awakened and taken to the recovery room in stable condition.   FOLLOW  UP:  [name] will be followed overnight for observation due to her  sleep apnea and cardiac status.  She will discharged tomorrow if stable.  She will be seen back in the office in a week for sutures out and follow up.       RAW/MEDQ  D:  02/17/2004  T:  02/17/2004  Job:  [id number]

## 2010-07-23 NOTE — Discharge Summary (Signed)
[facility]  Patient:    Kristen [name], Kristen [name] Visit Number: [id number] MRN: [id number]          Service Type: MED Location: 2000 2012 01 Attending Physician:  Kristen [name] Dictated by:   Kristen [name], P.A. Admit Date:  06/07/2001 Disc. Date: 06/09/01   CC:         Kristen [name], M.D. Kristen [name]   Referring Physician Discharge [place]  DATE OF BIRTH:  December 28, 1944  BRIEF HISTORY:  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  She was seen recently by Dr. Lovell [name] for chest pain.  She was scheduled for a rest/stress Cardiolite that was performed June 01, 2001.  This revealed an anterior wall scar and/or ischemia with a drop in her ejection fraction - previously 61% in March 2000, now 50%. The following day, after her study - she had more chest pain.  She was admitted to [facility] on June 07, 2001 for further evaluation.  PAST MEDICAL HISTORY:  Significant for multiple back surgeries.  She is status post hysterectomy and status post laparoscopic cholecystectomy.  She had childbirth x2.  There is also a history of hypertension, positive family history of coronary artery disease, and she smokes approximately eight cigarettes per day.  ALLERGIES:  MORPHINE, SULFA, VIBRAMYCIN, and TALWIN.  HOSPITAL COURSE:  As noted, this patient was admitted to [facility] on June 07, 2001 for further evaluation of chest pain.  CK-MB enzymes were negative as well as troponins.  However, she was noted to have a low potassium level; potassium was supplemented.  The patient underwent cardiac catheterization on June 08, 2001 performed by Dr. Gerri [name].  She was found to have a 75% mid LAD lesion as well as a 90% LAD lesion.  She had a 25% first diagonal.  The RCA was less than 20%.  She had mildly decreased left ventricular function.  Intervention was performed.  The 75% lesion was  reduced to 20%.  Please [name] Dr. Wanita [name] dictated report for full details. Arrangements were made to discharge the patient the following day in improved condition.  Her potassium was still mildly though; this was supplemented again prior to discharge and she was sent home on a potassium supplement.  LABORATORY DATA:  On the day of discharge, BUN 7, creatinine 0.7, potassium 3.3.  PTT 77.  CBC revealed hemoglobin 12.9; hematocrit 37.2; WBC 6.3; platelets 268,000.  Cardiac enzymes were negative.  Chest x-ray showed no active disease.  DISCHARGE MEDICATIONS:  1. Maxzide one daily.  2. She was taken off Toprol-XL which she had previously taken.  3. Cardizem CD 240 mg daily.  4. Xanax 0.5 mg q.i.d.  5. Flexeril 10 mg at bedtime.  6. Hydrocodone p.r.n. for pain.  7. Coated aspirin 325 mg daily.  8. K-Dur 20 mEq daily.  9. Plavix 75 mg daily for six months. 10. Nitroglycerin p.r.n. for chest pain.  ACTIVITY:  The patient was told to avoid any strenuous activity or driving for two days.  DIET:  She was to be on a low salt/low fat diet.  WOUND CARE:  She was told to call the office if she had any increased pain, swelling, or bleeding from her groin.  FOLLOW-UP:  She was to have a potassium level at her next office visit.  She was told to call the office on Monday for a follow-up appointment with Kristen [name], P.A.C. in  two weeks.  She was to [name] Dr. Lovell [name] as needed or as scheduled.  PROBLEM LIST AT TIME OF DISCHARGE:  1. Percutaneous coronary intervention of the left anterior descending artery     performed June 08, 2001.  2. Hypertension.  3. Left bundle-branch block.  4. Chronic back problems.  5. Hypokalemia.  6. Bradycardia.  7. Ongoing tobacco use. Dictated by:   Kristen [name], P.A. Attending Physician:  Kristen [name] DD:  06/09/01 TD:  06/09/01 Job: [id number] GN/[id number]

## 2010-07-23 NOTE — Discharge Summary (Signed)
Kristen [name], Kristen [name]             ACCOUNT NO.:  [id number]   MEDICAL RECORD NO.:  [id number]          PATIENT TYPE:  INP   LOCATION:  2033                         FACILITY:  [facility]   PHYSICIAN:  Jesse Sans. Wall, M.D.   DATE OF BIRTH:  12/28/1944   DATE OF ADMISSION:  11/28/2003  DATE OF DISCHARGE:  11/29/2003                                 DISCHARGE SUMMARY   DISCHARGE DIAGNOSES:  1.  Paroxysmal atrial fibrillation.      1.  Spontaneous converting to a sinus rhythm this admission.  2.  Ischemic cardiomyopathy with an ejection fraction of 25-30%.  3.  Coronary artery disease.  4.  Hypertension.  5.  Dyslipidemia.  6.  Obesity.  7.  Status post lumbar laminectomy.  8.  Gastroesophageal reflux disease.  9.  Chronic left bundle branch block.  10. Hypokalemia.   HOSPITAL COURSE:  Please see admission history and physical for complete  details.  Briefly, this 65 year old female patient presented to [facility]  Emergency Room with palpitations and tightness in her chest.  Her  electrocardiogram revealed atrial fibrillation.  She was placed on IV  Diltiazem.  Her rate was very well controlled on this.  She spontaneously  converted to a sinus rhythm later on the date of admission.  On the morning  of November 29, 2003, she was found to be in stable condition.  It was felt  she was ready for discharge to home.  A B-MET is pending at the time of this  dictation.  She may need an adjustment in her home potassium  supplementation.  Her admission potassium was 3.2.  Cardiac enzymes were  negative x 1.  Dr. Antoine Poche __________  on the patient, on November 29, 2003, and recommended she be discharged to home on Toprol instead of calcium  channel blocker, and he added Digoxin 0.25 mg daily.   LABS:  White count 6,000, hemoglobin 14.1, hematocrit 41.6, platelet count  324,000.  INR 3.  Sodium 136, potassium 3.2, chloride 105, CO2 23, glucose  168, BUN 20, creatinine 0.9.  Total bilirubin 0.3,  alkaline phosphatase 55,  AST 24, ALT 33, total protein 6.5, albumin 3.6, magnesium 2.1.  TSH 3.045.   Chest x-ray stable mild chronic bronchitic changes, no acute abnormality.   DISCHARGE MEDICATIONS:  1.  Toprol XL 100 mg daily.  2.  Digoxin 0.25 mg daily (new).  3.  Potassium 20 mEq.  4.  Lipitor 40 mg daily.  5.  HCTZ 12.5 mg daily.  6.  Spironolactone 25 mg b.i.d.  7.  Coumadin as taken previously.  8.  Avapro 300 mg a day.  9.  Xanax 0.5 mg q.i.d.  10. Cyclobenzaprine 10 mg three times a day.  11. Aspirin 81 mg a day.  12. Hydrocodone p.r.n.   PAIN MANAGEMENT:  Tylenol as needed.   ACTIVITY:  As tolerated.   DIET:  Low fat, low sodium.   FOLLOW UP:  1.  Coumadin Clinic as scheduled.  2.  She will be setup for a B-MET next week at [facility].  3.  Dr. Riley Kill in  two weeks, the office will contact her with an      appointment.   As noted above, the B-MET is pending at the time of this dictation.  Depending on the results her home potassium supplementation will be dosed  accordingly.       SW/MEDQ  D:  11/29/2003  T:  11/29/2003  Job:  [id number]   cc:   Stacie Glaze, M.D. [facility]   Maisie Fus D. Riley Kill, M.D. [facility]

## 2010-07-23 NOTE — Discharge Summary (Signed)
   NAME:  Kristen [name], Kristen [name]                       ACCOUNT NO.:  [id number]   MEDICAL RECORD NO.:  [id number]                   PATIENT TYPE:  INP   LOCATION:  2002                                 FACILITY:  [facility]   PHYSICIAN:  Evelene Croon, M.D.                  DATE OF BIRTH:  12/28/1944   DATE OF ADMISSION:  05/03/2002  DATE OF DISCHARGE:  05/09/2002                                 DISCHARGE SUMMARY   ADDENDUM:  The patient was originally considered for discharge home on May 07, 2002, however, she was quite tachycardic as well as mildly deconditioned.  She was kept an additional day and her beta blocker dose was changed to  Toprol-XL 50 mg daily.  Later that day, she spiked a fever up to 101.4.  A  workup was undertaken including a CBC, urinalysis and chest x-ray and blood  cultures.  Urinalysis was negative.  White blood count was 9.7, which  actually was down from previous level of 11,000.  Chest x-ray showed  bibasilar atelectasis.  Blood cultures were negative.  She increased her use  of her incentive spirometer and continued to ambulate with cardiac rehab  phase 1.  By May 09, 2002, she was afebrile and all vital signs were  stable.  She was ambulating up to 500 feet several times a day.  Her other  exam and workup were negative.  It was felt at this time she could be  discharged home.  The only change in her discharge medications has been  Lopressor is discontinued and she is now to take Toprol-XL 50 mg daily.  The  remainder of her medications will remain the same.  The remainder of her  discharge instructions will also remain the same.     Coral Ceo, P.A.                        Evelene Croon, M.D.    GC/MEDQ  D:  05/09/2002  T:  05/10/2002  Job:  [id number]   cc:   Arturo Morton. Riley Kill, M.D. [facility]   August Saucer, Florinda Marker.D.

## 2010-07-23 NOTE — H&P (Signed)
NAME:  Kristen [name], Kristen [name]                       ACCOUNT NO.:  [id number]   MEDICAL RECORD NO.:  [id number]                   PATIENT TYPE:  INP   LOCATION:  3019                                 FACILITY:  [facility]   PHYSICIAN:  Genene Churn. Love, M.D.                 DATE OF BIRTH:  12/28/1944   DATE OF ADMISSION:  07/12/2002  DATE OF DISCHARGE:                                HISTORY & PHYSICAL   PATIENT INFORMATION:  Patient address is [address],  [place], [place], [address].   REASON FOR ADMISSION:  This is one of multiple [facility]  admissions for this 65 year old right-handed black married female from  [place], [place] admitted from the emergency room for evaluation  of aphasia.   HISTORY OF PRESENT ILLNESS:  The patient has a greater than 35-year history  of hypertension and a 1-year history of documented coronary artery disease  with cardiac catheterization February 2003, April 2003, August 2003, and  February 2004.  She underwent single vessel coronary artery disease bypass  to her LAD on May 03, 2002.  She has been in cardiac rehabilitation  since that time and this morning awoke with complaints of left-sided  headaches.  This involved the left eye region and the front side of her  head.  She took some Advil and went back to bed but then awoke and noted  some speech problems.  She talked with her husband by phone and was going to  cardiac rehabilitation and there was noted to have difficulty with her  speech and came to the emergency room.  There was no associated chest pain,  palpitations, or at least she has had no chest pain in approximately two  weeks.   The patient states that she could picture what she wanted to say but could  not get the words out and she could not pronounce the words either.  She was  having some difficulty with her memory.  She has not had any history of  amaurosis fugax and her medications have  been aspirin as a blood thinner.   PAST MEDICAL HISTORY:  1. Lumbar spine surgeries x 3 in 1995, 1996, and 2000.  2. Single vessel coronary artery bypass surgery May 03, 2002.  3. Left rotator cuff tear injury and right knee arthroscopy.   ALLERGIES:  She has a history of allergies to MORPHINE developing hives,  SULFA, VIBRAMYCIN, and CODEINE.   HABITS:  She does not smoke cigarettes.  She does not drink alcohol.   MEDICATIONS:  1. Toprol XL 100 mg b.i.d.  2. Cardizem 120 mg q.d.  3. Spironolactone 25 mg b.i.d.  4. Aspirin 325 mg q.d.  5. Lipitor 20 mg q.h.s.  6. Flexeril 10 mg q.h.s.  7. Xanax 0.5 mg q.i.d.  8. Hydrocodone p.r.n. pain.   FAMILY HISTORY:  Her mother died at 63 from stroke.  Her father died  at 65  from myocardial infarction.  She has a brother 63 and a sister 16 living and  well.  She has two sons 63 and 34 living and well.   PHYSICAL EXAMINATION:  GENERAL:  Examination reveals a well-developed,  anxious, tearful black female in no acute distress.  VITAL SIGNS:  Blood pressure right and left arm was 120/80.  Heart rate was  64.  She was afebrile.  NECK:  There were no bruits.  NEUROLOGICAL:  She was alert and oriented x 3.  She could remember one of  three objects in five minutes.  She had some neologisms in her speech and  tended to get dates mixed up.  She made three mistakes in spelling world  backwards.  She did not know the president, vice president or governor.  She  could name objects.  She had fair repetition.  As mentioned, she did use  some neologisms.  She could repeat, she could read, and she could write.  Her cranial nerve examination revealed her disk flat.  Extraocular movements  full.  Visual fields full.  Corneals present.  No 7th nerve palsy.  Tongue  midline. Uvula midline.  Gags presents.  Sternocleidomastoid and trapezius  testing normal.  Motor examination shows 5/5 strength.  Fair finger-to-nose  and fair heel-to-shin.  Sensory  examination is intact to pinprick, touch,  normal position, and vibration.  Deep tendon reflexes are 2+.  Plantar  responses are downgoing on the left, +/- downgoing on the right.  HEENT:  Tympanic membranes are clear.  LUNGS: Clear to auscultation.  HEART:  No murmurs.  ABDOMEN:  There was no enlarged liver, spleen, or kidneys.   LABORATORY DATA:  CT scan of the brain was normal.  EKG showed normal sinus  rhythm with intraventricular conduction block.   Sodium 141, potassium 3.8, chloride 107, CO2 content not obtained.  BUN 24,  glucose 110.  PT 14.2, INR 1.1.   IMPRESSION:  1. Aphasia, code 784.3.  2. Stroke, code 434.1.  3. Coronary artery disease, code 429.2.  4. Hypertension, code 796.2.   PLAN:  Add Plavix.  Obtain MRI, MRA, and doppler study of the carotids.                                               Genene Churn. Sandria Manly, M.D.    JML/MEDQ  D:  07/12/2002  T:  07/14/2002  Job:  [id number]

## 2010-07-23 NOTE — Discharge Summary (Signed)
[name], PO             ACCOUNT NO.:  [id number]   MEDICAL RECORD NO.:  [id number]          PATIENT TYPE:  INP   LOCATION:  2033                         FACILITY:  [facility]   PHYSICIAN:  Tereso Newcomer, P.A.     DATE OF BIRTH:  12/28/1944   DATE OF ADMISSION:  11/28/2003  DATE OF DISCHARGE:                                 DISCHARGE SUMMARY   ADDENDUM:  The patient's potassium returned to 3.9.  She received 40 mEq x 3  during her admission of potassium.  Therefore, she will be discharged home  on potassium daily.  The rest of her medications will be the same as  previously dictated.       SW/MEDQ  D:  11/29/2003  T:  11/29/2003  Job:  [id number]   cc:   Arturo Morton. Riley Kill, M.D. [facility]   Dr. Lovell Sheehan

## 2010-07-23 NOTE — H&P (Signed)
NAME:  Kristen [name], Kristen [name]                       ACCOUNT NO.:  [id number]   MEDICAL RECORD NO.:  [id number]                   PATIENT TYPE:  INP   LOCATION:  6525                                 FACILITY:  [facility]   PHYSICIAN:  Maisie Fus C. Wall, M.D. [facility]            DATE OF BIRTH:  12/28/1944   DATE OF ADMISSION:  10/01/2001  DATE OF DISCHARGE:  10/03/2001                                HISTORY & PHYSICAL   CHIEF COMPLAINT:  Chest pressure much like what I had before my procedure.   HISTORY OF PRESENT ILLNESS:  The patient is a very pleasant 65 year old  African American female with known one-vessel obstructive disease status  post mid-LAD cutting PTCA in April 2003.  She had some nonobstructive  residual disease.  EF was 50% with moderate anterior apical hypokinesia.  She was treated with Plavix for 6 months.   She presents again this morning with midsternal chest pain similar in  location more severe than previous presentation.  She has a chronic left  bundle so we cannot read ischemia.  She has gotten some relief with IV  nitroglycerin.   PAST MEDICAL HISTORY:  She has had a cholecystectomy, hysterectomy, lumbar  surgery x3.  She has hypertension, history of tobacco use, gastroesophageal  reflux and depression.   MEDICATIONS ON ADMISSION:  1. Toprol 100 mg q.d.  2. Plavix 75 mg q.d. for 6 months.  3. Aspirin 81 mg q.d.  4. Maxzide 25 mg q.d.  5. Cardizem 240 mg q.d.  6. Xanax 0.5 mg q.i.d.  7. K-Dur 20 mEq q.d.  8. Flexeril 10 mg q.h.s.  9. Zocor unknown dose.  10.      Wellbutrin 150 b.i.d.   SOCIAL HISTORY:  Please refer to previous workups in the chart.  She works  for [employer].   FAMILY HISTORY:  Please refer to previous workups in the chart.   REVIEW OF SYSTEMS:  Noncontributory except for occasional reflux.   PHYSICAL EXAMINATION:  VITAL SIGNS:  Her blood pressure is 136/81, pulse is  80 and regular, respiratory rate is 24 unlabored, temperature is  97.6, O2  saturation is 100% on room air.  GENERAL:  She is in mild distress.  SKIN:  Warm and dry.  HEENT:  Unremarkable.  NECK:  No JVD.  Carotid upstrokes are equal bilaterally without bruits.  CARDIAC:  No obvious murmur, rub, or gallop.  She has a regular rate and  rhythm, normal S1 and S2.  Clear to auscultation and percussion.  ABDOMEN:  Protuberant with good bowel sounds.  It is nontender.  EXTREMITIES:  No cyanosis, clubbing, or edema.  Pulses were 2+/4+ both  femoral and dorsalis pedis pulses.  There are no bruits.  NEUROLOGIC:  Grossly intact.   LABORATORY DATA:  EKG shows normal sinus rhythm with left bundle.  Admission  laboratory data is unremarkable.  Creatinine is 1.1.   ASSESSMENT:  1. Recurrent  angina quite similar to what she had before.  Rule out     restenosis of the left anterior descending.  2. Mild left ventricular dysfunction.  3. History of tobacco use but none since April 2003.  4. Chronic hypokalemia.  5. Obesity.  6. Hypertension.  7. Depression.  8. Chronic left bundle branch block pattern.   PLAN:  1. Check serial enzymes.  2. Try to get to the cath lab this afternoon if time permits.  3. IV nitroglycerin, heparin, aspirin, Plavix and beta blocker.  4. Add Protonix.   We have talked to the patient; she is agreeable and willing to proceed with  the procedure.                                                  Thomas C. Daleen Squibb, M.D. [facility]    TCW/MEDQ  D:  10/01/2001  T:  10/04/2001  Job:  [id number]   cc:   Arturo Morton. Riley Kill, M.D. [facility]   Stacie Glaze, M.D. [facility]

## 2010-07-23 NOTE — Consult Note (Signed)
NAME:  Kristen [name], Kristen [name]                       ACCOUNT NO.:  [id number]   MEDICAL RECORD NO.:  [id number]                   PATIENT TYPE:  INP   LOCATION:  3019                                 FACILITY:  [facility]   PHYSICIAN:  Veneda Melter, M.D.                   DATE OF BIRTH:  12/28/1944   DATE OF CONSULTATION:  07/13/2002  DATE OF DISCHARGE:                                   CONSULTATION   HISTORY:  The patient is a 65 year old black female who was admitted by the  neurology service for severe headache and speech impediment on 07/12/2002.  Subsequent evaluation with cerebral MRA and MRI showed that she had a  temporal stroke.  During hospitalization, she has had slow improvement with  improved word-finding ability and cognition.  This morning, the patient had  vague discomfort in the mid sternum.  This was not associated with shortness  of breath nor radiation.  She has not had a recent chest pain at rest or  with exertion.  She has noticed any shortness of breath at rest or with  exertion, although she does note that she gets fatigued usually with and  after doing step exercises in cardiac rehabilitation.  She has not had any  orthopnea, paroxysmal nocturnal dyspnea, or lower extremity edema.  Currently the patient is pain free.  We were asked by the neurology service  to further assist in management of her cardiac issues and to have  echocardiogram performed for her stroke workup.   The patient underwent Caroid ultrasound on 07/12/2002 showing minimal plaque  in the common internal carotid arteries bilaterally.  Echocardiogram was  performed on 07/12/2002, interpreted by Dr. Jens Som, and this shows moderate  to severe LV dysfunction with EF of 20 to 30%.  There was akinesis of the  entire anterior wall as well as the apical septum and periapical wall.  Wall  thickness was mildly increased.  There was mild thickening of the aortic and  mitral valves without significant dysfunction.  The  apex was not visualized  well enough to exclude thrombus.   PAST MEDICAL HISTORY:  1. Coronary artery disease status post multiple interventions to the LAD     with subsequent single-vessel coronary artery bypass graft surgery on     05/03/2002 by Dr. Laneta Simmers.  2. She is status post lumbar laminectomy.  3. History of hypertension.  4. Gastroesophageal reflux disease.  5. Chronic left bundle branch block.  6. Status post cholecystectomy.  7. Status post hysterectomy.   SOCIAL HISTORY:  Currently she lives in [place] with her husband.  She  works at [employer].  She has a history of tobacco use, quit in April  2003.  Denies alcohol or recreational drug use.   FAMILY HISTORY:  Mother died at the age of 77 of stroke.  Father died at age  84 of myocardial infarction.  One sister died of  renal cancer at age of 34.  Two others are alive and well.   ALLERGIES:  SULFA, MORPHINE, TALWIN, CODEINE,  and VIBRAMYCIN.   CURRENT MEDICATIONS:  1. Toprol XL 100 mg b.i.d.  2. Spironolactone 25 mg daily.  3. Lipitor 20 mg q.h.s.  4. Cardizem CD 120 mg daily.  5. Xanax 0.5 mg four times a day.  6. Flexeril 10 mg t.i.d.  7. Hydrocodone q.6h. p.r.n.  8. Aspirin 325 mg t.i.d.  9. Vitamin E 400 units daily.   PHYSICAL EXAMINATION:  GENERAL:  Overweight black female in no acute  distress.  VITAL SIGNS:  Temperature 97.2, blood pressure 109/60, pulse 87,  respirations 18.  HEENT:  Pupils are equal, round, and reactive to light. Extraocular muscles  are intact.  Oropharynx shows no lesions.  NECK:  Supple.  No adenopathy, no bruits.  HEART:  Regular rate and rhythm without murmurs.  LUNGS:  Clear to auscultation.  ABDOMEN:  Soft, nontender.  EXTREMITIES:  No peripheral edema.  Peripheral pulses are palpable and equal  bilaterally.  NEUROLOGIC:  Well documented in chart.  She has some word-forming  difficulties.   LABORATORY DATA:  Chest x-ray shows mild left lower lobe atelectasis.   CT  of the had without contrast was negative for intracranial defects.   MRI, as noted by neurology, showed temporal artery stroke.   ECG shows normal sinus rhythm with intraventricular conduction delay  consistent wiht left bundle branch block as well R waves in leads V1 through  V2 consistent with anteroseptal myocardial infarction.   Laboratory data shows hemoglobin 16.0, hematocrit 46.  Sodium 141, potassium  3.8, chloride 107, BUN 24, creatinine 0.8, glucose 110.  Cardiac enzymes are  negative x 3.   ASSESSMENT:  The patient is a 65 year old female with advanced coronary  artery disease status post recent single-vessel coronary artery bypass graft  surgery who presents with acute temporal artery stroke resulting in  difficulty with comprehension and word-finding abilities.  She is improving  somewhat in the hospital on anticoagulation therapy.  Unfortunately, the  patient has a new finding of moderate to severe left ventricular dysfunction  by echocardiogram.  This suggests the patient suffered anterior and apical  myocardial infarction in the perioperative period.  As this is a new finding  since her surgery, prior catheterizations have shown normal left ventricular  function.  The patient does not appear to have an acute coronary syndrome  and has no signs or symptoms of congestive failure at this point.  The  distal, anterior, and apical walls are not well seen by surface  echocardiogram; thus, mural thrombus cannot be fully excluded.  Further  assessment with transesophageal echocardiogram may be beneficial to identify  any thrombus.  The patient would benefit from anticoagulation, and will  start her on heparin and then Coumadin.  Further assessment of the patient's  anterior wall for viability with stress imaging study or with cardiac  catheterization may be necessary at some point after the patient has  recovered from her acute event.  I will defer this to her primary  cardiologist, Dr.  Riley Kill.  I discussed treatment with the patient as well as Dr. Anne Hahn who  is on call for Dr. Sandria Manly.  We will follow the patient along with the  neurologists and will arrange for further studies as necessary.  Veneda Melter, M.D.    Melton Alar  D:  07/13/2002  T:  07/15/2002  Job:  [id number]   cc:   Arturo Morton. Riley Kill, M.D.   Genene Churn. Love, M.D.  [address]  [address]  [place]  [place] [address]  Fax: [phone]   Stacie Glaze, M.D. [facility]

## 2010-07-28 ENCOUNTER — Encounter (HOSPITAL_COMMUNITY): Payer: Self-pay | Attending: Cardiology

## 2010-07-30 ENCOUNTER — Encounter (HOSPITAL_COMMUNITY): Payer: Self-pay

## 2010-08-02 ENCOUNTER — Encounter (HOSPITAL_COMMUNITY): Payer: Self-pay

## 2010-08-04 ENCOUNTER — Encounter (HOSPITAL_COMMUNITY): Payer: Self-pay

## 2010-08-06 ENCOUNTER — Encounter (HOSPITAL_COMMUNITY): Payer: Medicare Other

## 2010-08-09 ENCOUNTER — Encounter (HOSPITAL_COMMUNITY): Payer: Medicare Other

## 2010-08-11 ENCOUNTER — Encounter (HOSPITAL_COMMUNITY): Payer: Medicare Other

## 2010-08-13 ENCOUNTER — Encounter (HOSPITAL_COMMUNITY): Payer: Medicare Other

## 2010-08-13 ENCOUNTER — Other Ambulatory Visit: Payer: Self-pay | Admitting: Internal Medicine

## 2010-08-16 ENCOUNTER — Encounter (HOSPITAL_COMMUNITY): Payer: Medicare Other

## 2010-08-18 ENCOUNTER — Encounter (HOSPITAL_COMMUNITY): Payer: Medicare Other

## 2010-08-20 ENCOUNTER — Encounter (HOSPITAL_COMMUNITY): Payer: Medicare Other

## 2010-08-23 ENCOUNTER — Encounter (HOSPITAL_COMMUNITY): Payer: Medicare Other

## 2010-08-23 ENCOUNTER — Telehealth: Payer: Self-pay | Admitting: Cardiology

## 2010-08-23 NOTE — Telephone Encounter (Signed)
PT IS HAVING HEART BEATING IN HER RIGHT EAR. PT WOULD LIKE TO TALK TO DR NURSE.

## 2010-08-23 NOTE — Telephone Encounter (Signed)
Left message for pt to call back  °

## 2010-08-24 ENCOUNTER — Telehealth: Payer: Self-pay | Admitting: Cardiology

## 2010-08-24 NOTE — Telephone Encounter (Signed)
CALL PLACED TO PT RE MESSAGE  PER PT IS FEELING  BETTER AT THIS TIME WAS ABLE TO TAKE AN EXTRA 1/2 TAB OF METOPROLOL FEELS BETTER OFFERED TO MAKE APPT WITH [facility] FOR THIS WEEK  PT DECLINED  STATED WILL CALL BACK LATER THIS WEEK IF S/S RETURN./CY

## 2010-08-24 NOTE — Telephone Encounter (Signed)
Pt left mesasge for lauren that she had pulsating "heartbeat" in right ear and rapid heartbeat this weekend, feels better today lauren called back and she wasn't in and when she called back lauren was on the phone and was to call her right back and never did, since she won't be back pls call

## 2010-08-24 NOTE — Telephone Encounter (Signed)
SEE OTHER TELEPHONE NOTE./CY 

## 2010-08-25 ENCOUNTER — Encounter (HOSPITAL_COMMUNITY): Payer: Medicare Other

## 2010-08-27 ENCOUNTER — Ambulatory Visit (INDEPENDENT_AMBULATORY_CARE_PROVIDER_SITE_OTHER): Payer: Medicare Other | Admitting: Internal Medicine

## 2010-08-27 ENCOUNTER — Encounter: Payer: Self-pay | Admitting: Internal Medicine

## 2010-08-27 ENCOUNTER — Encounter (HOSPITAL_COMMUNITY): Payer: Medicare Other

## 2010-08-27 VITALS — BP 136/70 | HR 72 | Temp 98.2°F | Resp 16 | Ht 61.0 in | Wt 226.0 lb

## 2010-08-27 DIAGNOSIS — I10 Essential (primary) hypertension: Secondary | ICD-10-CM

## 2010-08-27 DIAGNOSIS — E1059 Type 1 diabetes mellitus with other circulatory complications: Secondary | ICD-10-CM

## 2010-08-27 DIAGNOSIS — I4891 Unspecified atrial fibrillation: Secondary | ICD-10-CM

## 2010-08-27 MED ORDER — HYDROCODONE-ACETAMINOPHEN 10-325 MG PO TABS: 1.0000 | ORAL_TABLET | Freq: Four times a day (QID) | ORAL | Status: DC | PRN

## 2010-08-27 NOTE — Progress Notes (Signed)
  Subjective:    Patient ID: Kristen [name], female    DOB: 12/28/1944, 65 y.o.   MRN: [id number]  HPI Possible had an event last Saturday... Felt fatigue then when she walked to the bus stop she felt a racing heart. She took 1/2 tablet of toprol and xanax and this helped For several days after this even felt "pulsations in here ear.   Review of Systems  Constitutional: Positive for activity change and appetite change.       Fatigue  HENT: Negative for neck pain, neck stiffness and ear discharge.   Eyes: Negative for photophobia and visual disturbance.  Cardiovascular: Positive for palpitations.  Musculoskeletal: Positive for myalgias and joint swelling.  Neurological: Positive for weakness.  All other systems reviewed and are negative.       Objective:   Physical Exam  Constitutional: She is oriented to person, place, and time. She appears well-nourished.  HENT:  Head: Normocephalic and atraumatic.  Eyes: Conjunctivae and EOM are normal.  Neck: Normal range of motion. Neck supple.  Cardiovascular:       PAC's  Pulmonary/Chest: Effort normal and breath sounds normal.  Abdominal: Soft. Bowel sounds are normal.  Musculoskeletal: She exhibits tenderness. She exhibits no edema.  Neurological: She is alert and oriented to person, place, and time.  Skin: Skin is dry.  Psychiatric: Her behavior is normal. Thought content normal.          Assessment & Plan:  Seeing Dr Everardo All for DM control I belive she has described PAF that was limited and she did the appropriate thing with the toprol and the xanax monitoring if repeat events, sustained event would refer to EP and consider holter

## 2010-08-30 ENCOUNTER — Encounter (HOSPITAL_COMMUNITY): Payer: Medicare Other

## 2010-09-01 ENCOUNTER — Ambulatory Visit: Payer: Medicare Other | Admitting: Cardiology

## 2010-09-01 ENCOUNTER — Encounter (HOSPITAL_COMMUNITY): Payer: Medicare Other

## 2010-09-03 ENCOUNTER — Encounter (HOSPITAL_COMMUNITY): Payer: Medicare Other

## 2010-09-06 ENCOUNTER — Encounter (HOSPITAL_COMMUNITY): Payer: Medicare Other

## 2010-09-08 ENCOUNTER — Encounter (HOSPITAL_COMMUNITY): Payer: Medicare Other

## 2010-09-10 ENCOUNTER — Encounter (HOSPITAL_COMMUNITY): Payer: Medicare Other

## 2010-09-13 ENCOUNTER — Telehealth: Payer: Self-pay | Admitting: *Deleted

## 2010-09-13 ENCOUNTER — Encounter (HOSPITAL_COMMUNITY): Payer: Medicare Other

## 2010-09-13 NOTE — Telephone Encounter (Signed)
i do not know what is in this But it would be best to stop for several days and see how she feels

## 2010-09-13 NOTE — Telephone Encounter (Signed)
Pt is using "Arthritis hot", x 6 times daily,and she wonders if this is causing her Afib, and panic attacks??

## 2010-09-14 NOTE — Telephone Encounter (Signed)
Notified pt. 

## 2010-09-15 ENCOUNTER — Encounter (HOSPITAL_COMMUNITY): Payer: Medicare Other

## 2010-09-17 ENCOUNTER — Encounter (HOSPITAL_COMMUNITY): Payer: Medicare Other

## 2010-09-20 ENCOUNTER — Encounter (HOSPITAL_COMMUNITY): Payer: Medicare Other

## 2010-09-22 ENCOUNTER — Encounter (HOSPITAL_COMMUNITY): Payer: Medicare Other

## 2010-09-24 ENCOUNTER — Encounter (HOSPITAL_COMMUNITY): Payer: Medicare Other

## 2010-09-24 ENCOUNTER — Ambulatory Visit: Payer: Medicare Other | Admitting: Endocrinology

## 2010-09-27 ENCOUNTER — Other Ambulatory Visit: Payer: Self-pay | Admitting: *Deleted

## 2010-09-27 ENCOUNTER — Encounter (HOSPITAL_COMMUNITY): Payer: Medicare Other

## 2010-09-27 MED ORDER — HYDROCODONE-ACETAMINOPHEN 10-325 MG PO TABS: 1.0000 | ORAL_TABLET | Freq: Four times a day (QID) | ORAL | Status: DC | PRN

## 2010-09-29 ENCOUNTER — Encounter (HOSPITAL_COMMUNITY): Payer: Medicare Other

## 2010-09-29 ENCOUNTER — Other Ambulatory Visit: Payer: Self-pay | Admitting: *Deleted

## 2010-09-29 MED ORDER — ALPRAZOLAM 1 MG PO TABS: 1.0000 mg | ORAL_TABLET | Freq: Three times a day (TID) | ORAL | Status: DC

## 2010-10-01 ENCOUNTER — Encounter (HOSPITAL_COMMUNITY): Payer: Medicare Other

## 2010-10-04 ENCOUNTER — Encounter (HOSPITAL_COMMUNITY): Payer: Medicare Other

## 2010-10-06 ENCOUNTER — Encounter (HOSPITAL_COMMUNITY): Payer: Medicare Other

## 2010-10-07 ENCOUNTER — Other Ambulatory Visit: Payer: Self-pay | Admitting: Internal Medicine

## 2010-10-08 ENCOUNTER — Encounter (HOSPITAL_COMMUNITY): Payer: Medicare Other

## 2010-10-08 ENCOUNTER — Encounter: Payer: Self-pay | Admitting: Endocrinology

## 2010-10-08 ENCOUNTER — Other Ambulatory Visit (INDEPENDENT_AMBULATORY_CARE_PROVIDER_SITE_OTHER): Payer: Medicare Other

## 2010-10-08 ENCOUNTER — Ambulatory Visit (INDEPENDENT_AMBULATORY_CARE_PROVIDER_SITE_OTHER): Payer: Medicare Other | Admitting: Endocrinology

## 2010-10-08 VITALS — BP 118/76 | HR 78 | Temp 97.9°F | Ht 61.0 in | Wt 226.0 lb

## 2010-10-08 DIAGNOSIS — IMO0001 Reserved for inherently not codable concepts without codable children: Secondary | ICD-10-CM

## 2010-10-08 DIAGNOSIS — E876 Hypokalemia: Secondary | ICD-10-CM

## 2010-10-08 DIAGNOSIS — E1059 Type 1 diabetes mellitus with other circulatory complications: Secondary | ICD-10-CM

## 2010-10-08 DIAGNOSIS — I10 Essential (primary) hypertension: Secondary | ICD-10-CM

## 2010-10-08 DIAGNOSIS — E7800 Pure hypercholesterolemia, unspecified: Secondary | ICD-10-CM

## 2010-10-08 DIAGNOSIS — I4891 Unspecified atrial fibrillation: Secondary | ICD-10-CM

## 2010-10-08 DIAGNOSIS — M797 Fibromyalgia: Secondary | ICD-10-CM

## 2010-10-08 DIAGNOSIS — I2510 Atherosclerotic heart disease of native coronary artery without angina pectoris: Secondary | ICD-10-CM

## 2010-10-08 LAB — BASIC METABOLIC PANEL
Calcium: 9.2 mg/dL (ref 8.4–10.5)
Creatinine, Ser: 0.8 mg/dL (ref 0.4–1.2)

## 2010-10-08 LAB — HEMOGLOBIN A1C: Hgb A1c MFr Bld: 8.2 % — ABNORMAL HIGH (ref 4.6–6.5)

## 2010-10-08 NOTE — Patient Instructions (Addendum)
blood tests are being ordered for you today.  please call [phone] to hear your test results.  You will be prompted to enter the 9-digit "MRN" number that appears at the top left of this page, followed by #.  Then you will hear the message. pending the test results, please continue novolog three times a day (just before each meal) 50-55-40 units. check your blood sugar 2 times a day.  vary the time of day when you check, between before the 3 meals, and at bedtime.  also check if you have symptoms of your blood sugar being too high or too low.  please keep a record of the readings and bring it to your next appointment here.  please call us sooner if you are having low blood sugar episodes. Please make a follow-up appointment in 3 months.   (update: i left message on phone-tree:  Increase supper novolog to 50 units).

## 2010-10-08 NOTE — Progress Notes (Signed)
Subjective:    Patient ID: Kristen [name], female    DOB: 12/28/1944, 65 y.o.   MRN: [id number]  HPI pt states she feels well in general.  She says her diet is not good.  no cbg record, but states cbg's are well-controlled.  She says cbg's are highest at hs, and lowest in am Past Medical History  Diagnosis Date  . CAD (coronary artery disease)   . Atrial fibrillation   . Hypertension   . Hyperlipidemia   . GERD (gastroesophageal reflux disease)   . Hypokalemia   . Diabetes mellitus   . Depression   . Lumbar back pain   . LBBB (left bundle branch block)   . Headache   . Lymphadenitis   . Arthritis   . Bursitis     Past Surgical History  Procedure Date  . Angioplasty laminectomy  . Lumbar laminectomy   . Coronary artery bypass graft   . Abdominal hysterectomy   . Cholecystectomy   . Tonsillectomy     History   Social History  . Marital Status: Legally Separated    Spouse Name: N/A    Number of Children: N/A  . Years of Education: N/A   Occupational History  . Not on file.   Social History Main Topics  . Smoking status: Former Smoker    Quit date: 03/07/2001  . Smokeless tobacco: Not on file  . Alcohol Use: Not on file  . Drug Use: Not on file  . Sexually Active: Not on file   Other Topics Concern  . Not on file   Social History Narrative  . No narrative on file    Current Outpatient Prescriptions on File Prior to Visit  Medication Sig Dispense Refill  . ALPRAZolam (XANAX) 1 MG tablet Take 1 tablet (1 mg total) by mouth 3 (three) times daily.  90 tablet  3  . aspirin 81 MG tablet Take 81 mg by mouth daily.        Marland Kitchen atorvastatin (LIPITOR) 20 MG tablet Take 20 mg by mouth daily.        . B-D ULTRAFINE III SHORT PEN 31G X 8 MM MISC USE 7 TIMES A DAY  200 each  1  . cyclobenzaprine (FLEXERIL) 10 MG tablet Take 10 mg by mouth 2 (two) times daily as needed.        . digoxin (LANOXIN) 0.25 MG tablet Take 250 mcg by mouth daily.        . Exenatide  (BYETTA 10 MCG PEN [place]) Inject into the skin 2 (two) times daily.        Marland Kitchen gabapentin (NEURONTIN) 300 MG capsule TAKE 1 CAPSULE THREE TIMES A DAY  90 capsule  2  . glucose blood (ONE TOUCH TEST STRIPS) test strip 1 each by Other route 2 (two) times daily. Use as instructed       . hydrochlorothiazide (,MICROZIDE/HYDRODIURIL,) 12.5 MG capsule TAKE ONE CAPSULE BY MOUTH EVERY DAY  90 capsule  3  . HYDROcodone-acetaminophen (NORCO) 10-325 MG per tablet Take 1 tablet by mouth every 6 (six) hours as needed.  120 tablet  3  . insulin aspart (NOVOLOG) 100 UNIT/ML injection Inject into the skin 3 (three) times daily before meals. 3x a day (just before each meal) 50-55-40 units      . KLOR-CON M20 20 MEQ tablet TAKE 1 & 1/2 TABLETS BY MOUTH ONCE A DAY  60 tablet  5  . lansoprazole (PREVACID) 30 MG capsule Take 30 mg by mouth  as needed.       . metoprolol (TOPROL-XL) 100 MG 24 hr tablet TAKE 1 TABLET IN THE MORNING AND 1/2 TABLET INTHE EVENING  45 tablet  5  . nitroGLYCERIN (NITROSTAT) 0.4 MG SL tablet Place 0.4 mg under the tongue every 5 (five) minutes as needed.        Marland Kitchen spironolactone (ALDACTONE) 25 MG tablet TAKE 1 TABLET TWICE DAILY  60 tablet  2  . telmisartan (MICARDIS) 40 MG tablet Take 40 mg by mouth daily.        Marland Kitchen warfarin (COUMADIN) 6 MG tablet Take 6 mg by mouth as directed.          Allergies  Allergen Reactions  . Codeine   . Doxycycline Hyclate   . Morphine   . Pentazocine Lactate   . Sulfonamide Derivatives     Family History  Problem Relation Age of Onset  . Heart attack Father   . Heart disease Father   . Kidney disease    . Stroke    . Arthritis    . Hypertension    . Diabetes    . Stroke Mother     BP 118/76  Pulse 78  Temp(Src) 97.9 F (36.6 C) (Oral)  Ht 5\' 1"  (1.549 m)  Wt 226 lb (102.513 kg)  BMI 42.70 kg/m2  SpO2 95%   Review of Systems denies hypoglycemia    Objective:   Physical Exam GENERAL: no distress Pulses: dorsalis pedis intact bilat.     Feet: no deformity.  no ulcer on the feet.  feet are of normal color and temp.  no edema Neuro: sensation is intact to touch on the feet   Lab Results  Component Value Date   HGBA1C 8.2* 10/08/2010      Assessment & Plan:  Dm, needs increased rx

## 2010-10-11 ENCOUNTER — Encounter (HOSPITAL_COMMUNITY): Payer: Medicare Other

## 2010-10-13 ENCOUNTER — Encounter (HOSPITAL_COMMUNITY): Payer: Medicare Other

## 2010-10-14 ENCOUNTER — Other Ambulatory Visit: Payer: Self-pay | Admitting: Internal Medicine

## 2010-10-15 ENCOUNTER — Encounter (HOSPITAL_COMMUNITY): Payer: Medicare Other

## 2010-10-18 ENCOUNTER — Encounter (HOSPITAL_COMMUNITY): Payer: Medicare Other

## 2010-10-19 ENCOUNTER — Telehealth: Payer: Self-pay | Admitting: Cardiology

## 2010-10-19 MED ORDER — NITROGLYCERIN 0.4 MG SL SUBL: 0.4000 mg | SUBLINGUAL_TABLET | SUBLINGUAL | Status: DC | PRN

## 2010-10-19 NOTE — Telephone Encounter (Signed)
Pt is experiencing pain in her chest especially on the right side. Pt said she has been under a lot of stress pt brother in law passed on 06-28-2024then her sister (his spouse) passed on 10-14-2022. Pt said she doesn't know if this is nerves or anxiety. Pt HR is a little faster than normal so pt is taking a 1/2 metroplol at night as per RX. Pt said she is having a little sob but with exertion. Please call back.

## 2010-10-19 NOTE — Telephone Encounter (Signed)
Patient has been having CP consistently just today for a couple of hours. She is having mild SOB with activity. I have advised her to try taking Nitroglycerine tablets to see if this helps. She is aware that she needs to call 911 if she takes 3 and her symptoms do not subside. She will call the "on call" physician if she has anymore problems. She has been under a lot of stress lately and believes it could be related to this. I did inform her that I was also in the office tomorrow if she had anymore questions.

## 2010-10-20 ENCOUNTER — Encounter (HOSPITAL_COMMUNITY): Payer: Medicare Other

## 2010-10-21 NOTE — Telephone Encounter (Signed)
Patient having a lot of stress.  Sister died and then brother in law died.  She says family is stressful.   Discussed at some length with patient.  Reviewed.

## 2010-10-21 NOTE — Telephone Encounter (Signed)
Called patient to discuss.  Tremendous amounts of stress.  Her sister and brother in law died.  She is worried a lot.  She is not sleeping well.  Sometime she will notice her rate is fast, and I told her to come in for a tracing if she is fast.  TS

## 2010-10-22 ENCOUNTER — Encounter (HOSPITAL_COMMUNITY): Payer: Medicare Other

## 2010-10-22 ENCOUNTER — Telehealth: Payer: Self-pay | Admitting: *Deleted

## 2010-10-22 NOTE — Telephone Encounter (Signed)
Pt is asking for RX for sleep.  Has had 2 deaths in her family recently, and cannot sleep well at all.

## 2010-10-22 NOTE — Telephone Encounter (Signed)
Per [name]- may use flexeril for sleep

## 2010-10-22 NOTE — Telephone Encounter (Signed)
Notified pt. 

## 2010-10-25 ENCOUNTER — Other Ambulatory Visit: Payer: Self-pay | Admitting: Internal Medicine

## 2010-10-25 ENCOUNTER — Encounter (HOSPITAL_COMMUNITY): Payer: Medicare Other

## 2010-10-27 ENCOUNTER — Encounter (HOSPITAL_COMMUNITY): Payer: Medicare Other

## 2010-10-28 ENCOUNTER — Telehealth: Payer: Self-pay | Admitting: Cardiology

## 2010-10-29 ENCOUNTER — Encounter (HOSPITAL_COMMUNITY): Payer: Medicare Other

## 2010-11-01 ENCOUNTER — Encounter (HOSPITAL_COMMUNITY): Payer: Medicare Other

## 2010-11-03 ENCOUNTER — Encounter (HOSPITAL_COMMUNITY): Payer: Medicare Other

## 2010-11-05 ENCOUNTER — Encounter (HOSPITAL_COMMUNITY): Payer: Medicare Other

## 2010-11-19 ENCOUNTER — Telehealth: Payer: Self-pay | Admitting: *Deleted

## 2010-11-19 NOTE — Telephone Encounter (Signed)
Pt is calling with a sinus infection. Informed her Dr. Lovell Sheehan is out of town, but we can schedule her with one of the other partners. She prefers to call the Saturday clinic if no better tomorrow.

## 2010-11-20 ENCOUNTER — Encounter: Payer: Self-pay | Admitting: Family Medicine

## 2010-11-20 ENCOUNTER — Ambulatory Visit (INDEPENDENT_AMBULATORY_CARE_PROVIDER_SITE_OTHER): Payer: Medicare Other | Admitting: Family Medicine

## 2010-11-20 VITALS — BP 124/78 | HR 84 | Temp 98.9°F | Wt 223.8 lb

## 2010-11-20 DIAGNOSIS — K047 Periapical abscess without sinus: Secondary | ICD-10-CM

## 2010-11-20 MED ORDER — PENICILLIN V POTASSIUM 500 MG PO TABS: 500.0000 mg | ORAL_TABLET | Freq: Three times a day (TID) | ORAL | Status: AC

## 2010-11-20 NOTE — Patient Instructions (Signed)
Schedule followup with dentist for further evaluation Recommend followup at Coumadin clinic in one week to have protime reassessed on antibiotic

## 2010-11-20 NOTE — Progress Notes (Signed)
  Subjective:    Patient ID: Kristen [name], female    DOB: 12/28/1944, 65 y.o.   MRN: [id number]  HPI One-day history of right facial swelling. Moderate pain. She has hydrocodone which helps. No skin lesions. No injury. Denies fever. No nasal discharge. No blister or rash.  History of dental cavities. She has not seen dentist in quite some time. Medications reviewed. Allergies reviewed. Allergy to sulfa and doxycycline. No penicillin allergy.  She takes Coumadin for atrial fibrillation history  Past Medical History  Diagnosis Date  . CAD (coronary artery disease)   . Atrial fibrillation   . Hypertension   . Hyperlipidemia   . GERD (gastroesophageal reflux disease)   . Hypokalemia   . Diabetes mellitus   . Depression   . Lumbar back pain   . LBBB (left bundle branch block)   . Headache   . Lymphadenitis   . Arthritis   . Bursitis    Past Surgical History  Procedure Date  . Angioplasty laminectomy  . Lumbar laminectomy   . Coronary artery bypass graft   . Abdominal hysterectomy   . Cholecystectomy   . Tonsillectomy     reports that she quit smoking about 9 years ago. She does not have any smokeless tobacco history on file. Her alcohol and drug histories not on file. family history includes Arthritis in an unspecified family member; Diabetes in an unspecified family member; Heart attack in her father; Heart disease in her father; Hypertension in an unspecified family member; Kidney disease in an unspecified family member; and Stroke in her mother and unspecified family member. Allergies  Allergen Reactions  . Codeine   . Doxycycline Hyclate   . Morphine   . Pentazocine Lactate   . Sulfonamide Derivatives       Review of Systems  Constitutional: Negative for fever and chills.  HENT: Positive for facial swelling. Negative for ear pain, congestion, sinus pressure and ear discharge.   Eyes: Negative for visual disturbance.  Respiratory: Negative for cough.          Objective:   Physical Exam  Constitutional: She appears well-developed and well-nourished.  HENT:  Mouth/Throat: Oropharynx is clear and moist.       Patient has some right facial swelling over the right cheek region. Mild erythema. Mildly tender to palpation. No skin lesions in this region. Nasal exam is unremarkable. Ear canals and eardrums appear normal  significant right anterior gum erythema and swelling Very poor dentition  Eyes: Pupils are equal, round, and reactive to light.  Neck: Neck supple.  Lymphadenopathy:    She has no cervical adenopathy.          Assessment & Plan:  Dental abscess. Penicillin V 500 mg 3 times a day for 10 days. Prompt followup with dentist. Continue hydrocodone as needed for pain.  Recommend INR reassess in one week (patient on coumadin).

## 2010-11-22 ENCOUNTER — Other Ambulatory Visit: Payer: Self-pay | Admitting: Internal Medicine

## 2010-11-30 ENCOUNTER — Telehealth: Payer: Self-pay | Admitting: Physician Assistant

## 2010-11-30 NOTE — Telephone Encounter (Signed)
Pt called to state this morning she is feeling sensation of increased HR. States this happened yesterday and she took her meds as scheduled and felt better. Today HR running ~110, regular (has hx of afib and this does not feel like that). Just took home meds about 10 min ago. Advised pt to wait 30 minutes after taking meds to see how she feels. Last BP 120/70 at MD office and doesn't typically run low, so advised if after about an hour she is still feeling elevated HR to take 1/4 - 1/2 of her metoprolol. No CP SOB or any other sx. She expressed understanding and gratitude.

## 2010-12-08 LAB — LIPID PANEL
Cholesterol: 113
LDL Cholesterol: 66
Triglycerides: 109
VLDL: 22

## 2010-12-08 LAB — CARDIAC PANEL(CRET KIN+CKTOT+MB+TROPI)
Total CK: 115
Troponin I: <0.01

## 2010-12-08 LAB — BASIC METABOLIC PANEL
BUN: 15
BUN: 21
Chloride: 104
Chloride: 105
Creatinine, Ser: 0.83
Creatinine, Ser: 0.94
GFR calc Af Amer: >60
GFR calc non Af Amer: >60
GFR calc non Af Amer: >60
Glucose, Bld: 171 — ABNORMAL HIGH
Potassium: 3.5

## 2010-12-08 LAB — GLUCOSE, CAPILLARY
Glucose-Capillary: 138 — ABNORMAL HIGH
Glucose-Capillary: 148 — ABNORMAL HIGH
Glucose-Capillary: 155 — ABNORMAL HIGH
Glucose-Capillary: 178 — ABNORMAL HIGH
Glucose-Capillary: 180 — ABNORMAL HIGH

## 2010-12-08 LAB — CBC
MCV: 96.3
MCV: 96.6
Platelets: 265
Platelets: 268
RBC: 4.11
RDW: 13.4
WBC: 5.1
WBC: 6.9

## 2010-12-08 LAB — CK TOTAL AND CKMB (NOT AT ARMC)
Total CK: 115
Total CK: 135

## 2010-12-08 LAB — TROPONIN I: Troponin I: 0.01

## 2010-12-08 LAB — PROTIME-INR
INR: 2.7 — ABNORMAL HIGH
Prothrombin Time: 29.4 — ABNORMAL HIGH
Prothrombin Time: 30.4 — ABNORMAL HIGH

## 2010-12-08 LAB — POCT CARDIAC MARKERS
CKMB, poc: <1 — ABNORMAL LOW
Myoglobin, poc: 60.3
Troponin i, poc: <0.05

## 2010-12-08 LAB — MAGNESIUM: Magnesium: 1.7

## 2010-12-08 LAB — POCT I-STAT, CHEM 8
BUN: 24 — ABNORMAL HIGH
Calcium, Ion: 1.12
Chloride: 106
HCT: 43
Potassium: 3.7

## 2010-12-08 LAB — B-NATRIURETIC PEPTIDE (CONVERTED LAB): Pro B Natriuretic peptide (BNP): <30

## 2010-12-08 LAB — HEMOGLOBIN A1C: Hgb A1c MFr Bld: 9.2 — ABNORMAL HIGH

## 2010-12-10 ENCOUNTER — Other Ambulatory Visit: Payer: Self-pay | Admitting: Internal Medicine

## 2010-12-10 NOTE — Telephone Encounter (Signed)
Pt wants to moved her appt up because she is having bleeding gums, coughing up stuff and needs a dentist. Pt was schedule for sat clinic.

## 2010-12-11 ENCOUNTER — Ambulatory Visit: Payer: Medicare Other | Admitting: Family Medicine

## 2010-12-13 ENCOUNTER — Telehealth: Payer: Self-pay | Admitting: Cardiology

## 2010-12-13 NOTE — Telephone Encounter (Signed)
Called pt back, her med list states she is to have metoprolol 50 mg in evening/ not PRN. Denies CP/sob. Pt to take metoprolol, continue to check bp/p once this eve and tomorrow  and to call back tomorrow if needs to be seen or to go to er with any further problems this eve . Pt accepting of information and verbalized understanding.

## 2010-12-13 NOTE — Telephone Encounter (Signed)
Hx fib/coumadin use. Pt  States she is feeling more tired than usual with the afib, she has an app with Dr Tedra Senegal in 3 weeks. She is very weepy during conversation due to death of sister and prior death of son. She is unsure of BP Pulse is 84. Denies sob/cp, complains of weight gain but per pt it isnt water weight/ admits it is from eating and is fat weight gain. She has metoprolol 50 mg tablet tht she can take in evening but is unsure of BP. She will get BP at pharmacy when she picks up script and will call me with it. She declines app today With NP Norma Fredrickson. Stating she is just tired. She will call back and give bp result for advise.Alfonso Ramus RN

## 2010-12-13 NOTE — Telephone Encounter (Signed)
Pt called. Pt called she is in afib and is tired. Please call

## 2010-12-13 NOTE — Telephone Encounter (Signed)
Bp 177/  And 155/89  Hr was 98 165/81 hr 94

## 2010-12-14 ENCOUNTER — Emergency Department (HOSPITAL_COMMUNITY): Payer: Medicare Other

## 2010-12-14 ENCOUNTER — Observation Stay (HOSPITAL_COMMUNITY)
Admission: EM | Admit: 2010-12-14 | Discharge: 2010-12-16 | DRG: 310 | Disposition: A | Discharge status: Home / Self Care | Payer: Medicare Other | Attending: Cardiology | Admitting: Cardiology

## 2010-12-14 ENCOUNTER — Inpatient Hospital Stay (HOSPITAL_COMMUNITY): Payer: Medicare Other

## 2010-12-14 DIAGNOSIS — I2589 Other forms of chronic ischemic heart disease: Secondary | ICD-10-CM | POA: Insufficient documentation

## 2010-12-14 DIAGNOSIS — E785 Hyperlipidemia, unspecified: Secondary | ICD-10-CM | POA: Insufficient documentation

## 2010-12-14 DIAGNOSIS — I517 Cardiomegaly: Secondary | ICD-10-CM

## 2010-12-14 DIAGNOSIS — K219 Gastro-esophageal reflux disease without esophagitis: Secondary | ICD-10-CM | POA: Insufficient documentation

## 2010-12-14 DIAGNOSIS — Z951 Presence of aortocoronary bypass graft: Secondary | ICD-10-CM | POA: Insufficient documentation

## 2010-12-14 DIAGNOSIS — I447 Left bundle-branch block, unspecified: Secondary | ICD-10-CM | POA: Insufficient documentation

## 2010-12-14 DIAGNOSIS — M545 Low back pain, unspecified: Secondary | ICD-10-CM | POA: Insufficient documentation

## 2010-12-14 DIAGNOSIS — F3289 Other specified depressive episodes: Secondary | ICD-10-CM | POA: Insufficient documentation

## 2010-12-14 DIAGNOSIS — I10 Essential (primary) hypertension: Secondary | ICD-10-CM | POA: Insufficient documentation

## 2010-12-14 DIAGNOSIS — R000 Tachycardia, unspecified: Principal | ICD-10-CM | POA: Insufficient documentation

## 2010-12-14 DIAGNOSIS — F329 Major depressive disorder, single episode, unspecified: Secondary | ICD-10-CM | POA: Insufficient documentation

## 2010-12-14 DIAGNOSIS — Z7901 Long term (current) use of anticoagulants: Secondary | ICD-10-CM | POA: Insufficient documentation

## 2010-12-14 DIAGNOSIS — Z23 Encounter for immunization: Secondary | ICD-10-CM | POA: Insufficient documentation

## 2010-12-14 DIAGNOSIS — R0609 Other forms of dyspnea: Secondary | ICD-10-CM | POA: Insufficient documentation

## 2010-12-14 DIAGNOSIS — Z9861 Coronary angioplasty status: Secondary | ICD-10-CM | POA: Insufficient documentation

## 2010-12-14 DIAGNOSIS — I4891 Unspecified atrial fibrillation: Secondary | ICD-10-CM | POA: Insufficient documentation

## 2010-12-14 DIAGNOSIS — G8929 Other chronic pain: Secondary | ICD-10-CM | POA: Insufficient documentation

## 2010-12-14 DIAGNOSIS — R002 Palpitations: Secondary | ICD-10-CM | POA: Insufficient documentation

## 2010-12-14 DIAGNOSIS — R0989 Other specified symptoms and signs involving the circulatory and respiratory systems: Secondary | ICD-10-CM | POA: Insufficient documentation

## 2010-12-14 DIAGNOSIS — I2510 Atherosclerotic heart disease of native coronary artery without angina pectoris: Secondary | ICD-10-CM | POA: Insufficient documentation

## 2010-12-14 DIAGNOSIS — E669 Obesity, unspecified: Secondary | ICD-10-CM | POA: Insufficient documentation

## 2010-12-14 LAB — DIFFERENTIAL
Eosinophils Absolute: 0.2 10*3/uL (ref 0.0–0.7)
Lymphocytes Relative: 52 % — ABNORMAL HIGH (ref 12–46)
Lymphs Abs: 4.6 10*3/uL — ABNORMAL HIGH (ref 0.7–4.0)
Monocytes Relative: 8 % (ref 3–12)
Neutro Abs: 3.3 10*3/uL (ref 1.7–7.7)
Neutrophils Relative %: 37 % — ABNORMAL LOW (ref 43–77)

## 2010-12-14 LAB — HEMOGLOBIN A1C
Hgb A1c MFr Bld: 8 % — ABNORMAL HIGH (ref <5.7)
Mean Plasma Glucose: 183 mg/dL — ABNORMAL HIGH (ref <117)

## 2010-12-14 LAB — URINALYSIS, ROUTINE W REFLEX MICROSCOPIC
Ketones, ur: NEGATIVE mg/dL
Leukocytes, UA: NEGATIVE
Nitrite: NEGATIVE
Protein, ur: NEGATIVE mg/dL
pH: 5.5 (ref 5.0–8.0)

## 2010-12-14 LAB — CARDIAC PANEL(CRET KIN+CKTOT+MB+TROPI)
CK, MB: 2.4 ng/mL (ref 0.3–4.0)
Relative Index: 1.5 (ref 0.0–2.5)
Total CK: 155 U/L (ref 7–177)
Troponin I: <0.3 ng/mL (ref <0.30)

## 2010-12-14 LAB — CBC
Hemoglobin: 14 g/dL (ref 12.0–15.0)
MCH: 32.2 pg (ref 26.0–34.0)
MCV: 91.5 fL (ref 78.0–100.0)
Platelets: 303 10*3/uL (ref 150–400)
RBC: 4.35 MIL/uL (ref 3.87–5.11)
WBC: 8.9 10*3/uL (ref 4.0–10.5)

## 2010-12-14 LAB — BASIC METABOLIC PANEL
Calcium: 9.9 mg/dL (ref 8.4–10.5)
Chloride: 102 mEq/L (ref 96–112)
Creatinine, Ser: 0.82 mg/dL (ref 0.50–1.10)
GFR calc Af Amer: 86 mL/min — ABNORMAL LOW (ref >90)
Sodium: 138 mEq/L (ref 135–145)

## 2010-12-14 LAB — DIGOXIN LEVEL: Digoxin Level: 1.5 ng/mL (ref 0.8–2.0)

## 2010-12-14 LAB — PROTIME-INR
INR: 1.44 (ref 0.00–1.49)
Prothrombin Time: 17.8 seconds — ABNORMAL HIGH (ref 11.6–15.2)

## 2010-12-14 LAB — POCT I-STAT TROPONIN I: Troponin i, poc: 0 ng/mL (ref 0.00–0.08)

## 2010-12-14 LAB — GLUCOSE, CAPILLARY: Glucose-Capillary: 151 mg/dL — ABNORMAL HIGH (ref 70–99)

## 2010-12-15 LAB — GLUCOSE, CAPILLARY
Glucose-Capillary: 209 mg/dL — ABNORMAL HIGH (ref 70–99)
Glucose-Capillary: 196 mg/dL — ABNORMAL HIGH (ref 70–99)

## 2010-12-15 LAB — CBC
HCT: 39.7 % (ref 36.0–46.0)
Hemoglobin: 13.3 g/dL (ref 12.0–15.0)
MCH: 31.1 pg (ref 26.0–34.0)
MCHC: 33.5 g/dL (ref 30.0–36.0)
RDW: 13.9 % (ref 11.5–15.5)

## 2010-12-15 LAB — BASIC METABOLIC PANEL
BUN: 19 mg/dL (ref 6–23)
CO2: 22 mEq/L (ref 19–32)
Calcium: 9.3 mg/dL (ref 8.4–10.5)
Chloride: 102 mEq/L (ref 96–112)
Creatinine, Ser: 0.63 mg/dL (ref 0.50–1.10)

## 2010-12-15 LAB — PROTIME-INR: Prothrombin Time: 18.3 seconds — ABNORMAL HIGH (ref 11.6–15.2)

## 2010-12-15 NOTE — Telephone Encounter (Signed)
The patient is in the hospital for observation of her rate.  TS

## 2010-12-16 LAB — PROTIME-INR
INR: 1.92 — ABNORMAL HIGH (ref 0.00–1.49)
Prothrombin Time: 22.3 seconds — ABNORMAL HIGH (ref 11.6–15.2)

## 2010-12-16 LAB — GLUCOSE, CAPILLARY: Glucose-Capillary: 132 mg/dL — ABNORMAL HIGH (ref 70–99)

## 2010-12-16 LAB — CBC
MCHC: 34.9 g/dL (ref 30.0–36.0)
Platelets: 286 10*3/uL (ref 150–400)
RDW: 13.7 % (ref 11.5–15.5)

## 2010-12-21 ENCOUNTER — Telehealth: Payer: Self-pay | Admitting: Physician Assistant

## 2010-12-21 NOTE — Telephone Encounter (Signed)
Pt recently d/c'd from hosp. She was on heparin while in hosp and on coum at d/c. She was doing OK and today noticed large bruises on her abd in the area where she gives herself insulin. One is sore, all are large. Coumadin ck scheduled for Fri with PA visit. Advised her that if sx are not localized to injection sites, she needs to come to ER ASAP. Otherwise, ofc to call in am and get her in for coum ck and hopefully PA visit. She is to hold [employer].

## 2010-12-22 ENCOUNTER — Ambulatory Visit (INDEPENDENT_AMBULATORY_CARE_PROVIDER_SITE_OTHER): Payer: Medicare Other | Admitting: *Deleted

## 2010-12-22 ENCOUNTER — Encounter: Payer: Self-pay | Admitting: Nurse Practitioner

## 2010-12-22 ENCOUNTER — Ambulatory Visit (INDEPENDENT_AMBULATORY_CARE_PROVIDER_SITE_OTHER): Payer: Medicare Other | Admitting: Nurse Practitioner

## 2010-12-22 VITALS — BP 118/78 | HR 84 | Ht 61.0 in | Wt 226.1 lb

## 2010-12-22 DIAGNOSIS — I10 Essential (primary) hypertension: Secondary | ICD-10-CM

## 2010-12-22 DIAGNOSIS — I4891 Unspecified atrial fibrillation: Secondary | ICD-10-CM

## 2010-12-22 DIAGNOSIS — T148XXA Other injury of unspecified body region, initial encounter: Secondary | ICD-10-CM

## 2010-12-22 DIAGNOSIS — R7989 Other specified abnormal findings of blood chemistry: Secondary | ICD-10-CM

## 2010-12-22 DIAGNOSIS — F329 Major depressive disorder, single episode, unspecified: Secondary | ICD-10-CM

## 2010-12-22 DIAGNOSIS — Z7901 Long term (current) use of anticoagulants: Secondary | ICD-10-CM

## 2010-12-22 DIAGNOSIS — E1059 Type 1 diabetes mellitus with other circulatory complications: Secondary | ICD-10-CM

## 2010-12-22 LAB — BASIC METABOLIC PANEL
BUN: 16
CO2: 25
Chloride: 105
Creatinine, Ser: 0.71

## 2010-12-22 LAB — URINALYSIS, ROUTINE W REFLEX MICROSCOPIC
Bilirubin Urine: NEGATIVE
Glucose, UA: NEGATIVE
Hgb urine dipstick: NEGATIVE
Ketones, ur: NEGATIVE
pH: 5.5

## 2010-12-22 LAB — DIFFERENTIAL
Basophils Absolute: 0
Eosinophils Relative: 1
Lymphocytes Relative: 44
Monocytes Absolute: 0.5

## 2010-12-22 LAB — POCT CARDIAC MARKERS
CKMB, poc: <1 — ABNORMAL LOW
Myoglobin, poc: 50.6
Myoglobin, poc: 52.6
Operator id: 192351
Operator id: 270651
Troponin i, poc: <0.05

## 2010-12-22 LAB — I-STAT 8, (EC8 V) (CONVERTED LAB)
BUN: 16
Bicarbonate: 22.1
Chloride: 107
HCT: 46
Hemoglobin: 15.6 — ABNORMAL HIGH
Operator id: 151321
Potassium: 3.8
Sodium: 139

## 2010-12-22 LAB — CBC
HCT: 39.6
Hemoglobin: 13.4
RDW: 14

## 2010-12-22 LAB — POCT INR: INR: 2.6

## 2010-12-22 LAB — PROTIME-INR: INR: 1.6 — ABNORMAL HIGH

## 2010-12-22 NOTE — Assessment & Plan Note (Signed)
She continues to grieve and be depressed. I suspect this will be a long term issue for her. I encouraged her to continue with her efforts in counseling. She says medicines makes it worse.

## 2010-12-22 NOTE — Assessment & Plan Note (Signed)
This is resolving. We will check her coumadin level today. Her dose had been increased at her recent hospitalization.

## 2010-12-22 NOTE — Assessment & Plan Note (Addendum)
She has had recent admission with tachy palpitations that were felt to be recurrent arrhythmia. Toprol was increased. She is not having any other problems at this time from standpoint. We will keep her on her current plan. She says she has follow up with Dr. Riley Kill next month. Patient is agreeable to this plan and will call if any problems develop in the interim.

## 2010-12-22 NOTE — Patient Instructions (Addendum)
We are going to check your coumadin level and your labs today to relook at your thyroid levels  The bruises on your belly are going to go away. Let us know if its gets any worse.   Stay on your current medicines.   We will see you back in a month.

## 2010-12-22 NOTE — Assessment & Plan Note (Signed)
Her levels will be rechecked today.

## 2010-12-22 NOTE — Progress Notes (Signed)
Kristen [name] Date of Birth: 12/28/1944 Medical Record #[id number]  History of Present Illness: Kristen [name] is seen today for a work in visit. She is seen for Dr. Riley Kill. She has known CAD, prior CABG and a history of PAF. She has recently been hospitalized with tachy palpitations. Coumadin level was a little low. Toprol was increased and her coumadin adjusted. TSH was slightly elevated and needs rechecking. She called the PA last night to complain of bruises on her belly. They correlate in location with her insulin injections. They are rather large but resolving. No complaint of palpitations. No other bruising noted. No bleeding. She remains depressed. Has apparently had multiple losses in her life. She continues to grieve.   Current Outpatient Prescriptions on File Prior to Visit  Medication Sig Dispense Refill  . ALPRAZolam (XANAX) 1 MG tablet Take 1 tablet (1 mg total) by mouth 3 (three) times daily.  90 tablet  3  . aspirin 81 MG tablet Take 81 mg by mouth daily.        Marland Kitchen atorvastatin (LIPITOR) 20 MG tablet Take 20 mg by mouth daily.        . B-D ULTRAFINE III SHORT PEN 31G X 8 MM MISC USE 7 TIMES A DAY  200 each  1  . cyclobenzaprine (FLEXERIL) 10 MG tablet TAKE 1 TABLET BY MOUTH TWICE A DAY  60 tablet  0  . digoxin (LANOXIN) 0.25 MG tablet TAKE 1 TABLET EVERY DAY  90 tablet  2  . Exenatide (BYETTA 10 MCG PEN [facility]) Inject into the skin 2 (two) times daily.        Marland Kitchen gabapentin (NEURONTIN) 300 MG capsule TAKE 1 CAPSULE THREE TIMES A DAY  90 capsule  2  . hydrochlorothiazide (,MICROZIDE/HYDRODIURIL,) 12.5 MG capsule TAKE ONE CAPSULE BY MOUTH EVERY DAY  90 capsule  3  . HYDROcodone-acetaminophen (NORCO) 10-325 MG per tablet Take 1 tablet by mouth every 6 (six) hours as needed.  120 tablet  3  . insulin aspart (NOVOLOG) 100 UNIT/ML injection Inject into the skin 3 (three) times daily before meals. 3x a day (just before each meal) 50-55-40 units      . KLOR-CON M20 20 MEQ tablet TAKE 1 & 1/2  TABLETS BY MOUTH ONCE A DAY  60 tablet  5  . lansoprazole (PREVACID) 30 MG capsule Take 30 mg by mouth as needed.       . nitroGLYCERIN (NITROSTAT) 0.4 MG SL tablet Place 1 tablet (0.4 mg total) under the tongue every 5 (five) minutes as needed.  25 tablet  3  . ONE TOUCH ULTRA TEST test strip USE 6 TIMES DAILY AS DIRECTED  200 each  4  . spironolactone (ALDACTONE) 25 MG tablet TAKE 1 TABLET TWICE DAILY  60 tablet  2  . telmisartan (MICARDIS) 40 MG tablet Take 40 mg by mouth daily.        Marland Kitchen warfarin (COUMADIN) 6 MG tablet Take 6 mg by mouth as directed.        Marland Kitchen DISCONTD: metoprolol (TOPROL-XL) 100 MG 24 hr tablet TAKE 1 TABLET IN THE MORNING AND 1/2 TABLET INTHE EVENING  45 tablet  5  . HYDROcodone-acetaminophen (NORCO) 10-325 MG per tablet Take 1 tablet by mouth every 6 (six) hours as needed for pain.  120 tablet  2    Allergies  Allergen Reactions  . Codeine   . Doxycycline Hyclate   . Morphine   . Pentazocine Lactate   . Sulfonamide Derivatives  Past Medical History  Diagnosis Date  . CAD (coronary artery disease)   . PAF (paroxysmal atrial fibrillation)   . Hypertension   . Hyperlipidemia   . GERD (gastroesophageal reflux disease)   . Hypokalemia   . Diabetes mellitus   . Depression   . Lumbar back pain   . LBBB (left bundle branch block)   . Headache   . Lymphadenitis   . Arthritis   . Bursitis   . Chronic anticoagulation   . Heart palpitations   . Diastolic dysfunction     per echo in October 2012 with EF 50 to 55%    Past Surgical History  Procedure Date  . Angioplasty laminectomy  . Lumbar laminectomy   . Coronary artery bypass graft     LIMA to LAD   . Abdominal hysterectomy   . Cholecystectomy   . Tonsillectomy     History  Smoking status  . Former Smoker  . Quit date: 03/07/2001  Smokeless tobacco  . Not on file    History  Alcohol Use No    Family History  Problem Relation Age of Onset  . Heart attack Father   . Heart disease Father    . Kidney disease    . Stroke    . Arthritis    . Hypertension    . Diabetes    . Stroke Mother     Review of Systems: The review of systems is positive for feeling tired and depressed. She does note that her hair feels dryer. More constipation. She is worried about thyroid levels.  All other systems were reviewed and are negative.  Physical Exam: BP 118/78  Pulse 84  Ht 5\' 1"  (1.549 m)  Wt 226 lb 1.9 oz (102.567 kg)  BMI 42.72 kg/m2 Patient is very pleasant and in no acute distress. She is obese. Mood seems depressed to me. Skin is warm and dry. Color is normal.  HEENT is unremarkable. Normocephalic/atraumatic. PERRL. Sclera are nonicteric. Neck is supple. No masses. No JVD. Lungs are clear. Cardiac exam shows a regular rate and rhythm. Abdomen is obese but soft. She has several bruises the size of 50 cent pieces but they are resolving and all are soft to touch. No other bruising noted.  Extremities are without edema. Gait and ROM are intact. No gross neurologic deficits noted.   LABORATORY DATA:   Assessment / Plan:

## 2010-12-23 ENCOUNTER — Ambulatory Visit: Payer: Self-pay | Admitting: Physician Assistant

## 2010-12-24 ENCOUNTER — Ambulatory Visit: Payer: Self-pay | Admitting: Physician Assistant

## 2010-12-24 ENCOUNTER — Other Ambulatory Visit: Payer: Medicare Other | Admitting: *Deleted

## 2010-12-24 ENCOUNTER — Ambulatory Visit: Payer: Self-pay | Admitting: Nurse Practitioner

## 2010-12-29 ENCOUNTER — Telehealth: Payer: Self-pay | Admitting: *Deleted

## 2010-12-29 NOTE — H&P (Signed)
NAME:  Kristen [name], YASSIN NO.:  [id number]  MEDICAL RECORD NO.:  [id number]  LOCATION:  2035                         FACILITY:  [facility]  PHYSICIAN:  Marca Ancona, MD      DATE OF BIRTH:  12/28/1944  DATE OF ADMISSION:  12/14/2010 DATE OF DISCHARGE:                             HISTORY & PHYSICAL   PRIMARY CARE PHYSICIAN:  Dr. Darryll Capers.  CARDIOLOGIST:  Dr. Shawnie Pons.  HISTORY OF PRESENT ILLNESS:  This is a 65 year old with history of coronary artery disease, status post coronary artery bypass grafting, ischemic cardiomyopathy, paroxysmal atrial fibrillation presented with episodes of racing heart rate.  For the past 3 weeks patient issues she has had episodes were her heart rate rises to 150s and is irregular. She does monitor her pulse.  She gets very anxious with these spells. She does not have lightheadedness or syncope.  This morning, she woke up at 5:15 with her heart racing to a rate up to the 150s and irregular, this episode of tachycardia lasted longer than usual.  She took her morning medications and came to the hospital.  In the emergency room, she has actually been in normal sinus rhythm since she arrived.  She does report increased fatigue for the last 3 weeks.  She denies any chest pain.  She has chronic stable exertional dyspnea with housework, walking up steps, and walking for long distances on flat ground.  MEDICATIONS: 1. Aspirin 81 mg daily. 2. Atorvastatin 20 mg daily. 3. Digoxin 0.25 mg daily. 4. Exenatide 10 b.i.d. 5. Gabapentin 300 mg t.i.d. 6. Hydrochlorothiazide 12.5 mg daily. 7. Insulin Aspart 15 units q.a.c. 8. Potassium chloride 30 mEq b.i.d. 9. Lansoprazole 30 mg daily. 10.Toprol-XL 100 mg in the morning, 50 mg the evening. 11.Spironolactone 25 mg b.i.d. 12.Telmisartan 40 mg daily. 13.Coumadin.  PAST MEDICAL HISTORY: 1. Chronic left bundle-branch block. 2. Coronary artery disease.  The patient had bypass surgery at  the     LIMA to the LAD, single-vessel bypass.  Last heart catheterization     was in January 2010 that showed only actually of 30-40% proximal     LAD stenosis.  The LIMA was patent.  Other vessels were okay.     Ischemic cardiomyopathy.  Patient's last echo was in November, 2009     and showed mid apical anterior septal akinesis, anterior     hypokinesis and EF of 40% was a difficult study. 3. Paroxysmal atrial fibrillation.  Patient is on Coumadin for this. 4. Obesity. 5. Hypertension. 6. Gastroesophageal reflux disease. 7. Hyperlipidemia. 8. Depression. 9. Low back pain.  SOCIAL HISTORY:  Patient is separated from husband, lives by herself. She does not smoke or drink any alcohol.  She is on disability.  FAMILY HISTORY:  Noncontributory.  REVIEW OF SYSTEMS:  All systems were reviewed and were negative except as noted in history of present illness.  PHYSICAL EXAM:  VITAL SIGNS:  Temperature is 98.5, pulse in the hospital has ranged from the 70s to the 100s.  She has been in sinus rhythm. Initial blood pressure 160/68.  Current blood pressure 118/72.  Oxygen saturation 97% on room air.  Telemetry, the patient is in sinus rhythm. GENERAL:  Obese female in  no apparent distress. HEENT:  Normal exam. ABDOMEN:  Soft, nontender.  No hepatosplenomegaly.  Normal bowel sounds. NECK:  No thyromegaly or thyroid nodule.  There is no JVD. CARDIOVASCULAR:  Heart regular.  S1, S2.  No S3, no S4.  There is no murmur.  There is no carotid bruit.  There is no peripheral edema. There are no posterior tibial pulses. EXTREMITIES:  No clubbing or cyanosis.  LUNGS:  Clear to auscultation bilaterally.  Normal respiratory effort. SKIN:  Normal exam. NEUROLOGIC:  Alert oriented x3.  RADIOLOGY:  EKG shows normal sinus rhythm at a rate of 102 with left bundle-branch block.  This is chronic.  LABORATORY DATA:  White count 8.9, hematocrit 39.8, platelets 303, potassium 3.4, creatinine 0.82.   Urinalysis negative for UTI.  Point-of- care troponin is 0.  INR is 1.37.  IMPRESSION:  This is a 65 year old with history of coronary artery disease status post coronary artery bypass grafting, ischemic cardiomyopathy, common and paroxysmal atrial fibrillation presents with episodes of tachycardia concerning for possible paroxysmal atrial fibrillation. 1. Rhythm, the patient states that her heart rate is in the 150s and     irregular with palpitations.  She does not get lightheaded.  She     does have a history of paroxysmal atrial fibrillation.  She has     also had some risk of ventricular tachycardia given heard depressed     ejection fraction.  We will plan today to increase the Toprol-XL to     100 mg b.i.d.  I am going to get a digoxin level as she is taking a     rather high dose of digoxin.  I will continue her Coumadin.  We are     going to monitor her on telemetry.  If atrial fibrillation is     documented, I would considered starting her on either dofetilide or     amiodarone, given her symptoms I probably landed towards dofetilide     as she is relatively young.  However, we do this she has to be in     the hospital for 3 days for monitoring.  I would also do want to     rule out runs of slow ventricular tachycardia given her decreased     ejection fraction.  If she has no episodes of atrial fibrillation     overnight, we will put her on a 3 week event monitor to go home     with. 2. Ischemic cardiomyopathy, the patient's EF was 40% in 2009 echo.     Her volume status looks okay today.  She has stable chronic dyspnea     that likely is also contributed to by her obesity.  We will get a     chest x-ray PA and     lateral, check a BNP.  I will continue her on her beta-blocker and     her ARB.  We will repeat an echocardiogram. 3. Coronary artery disease, this is stable.  The patient had no chest     pain.     Marca Ancona, MD     DM/MEDQ  D:  12/14/2010  T:   12/14/2010  Job:  [id number]  cc:   Stacie Glaze, MD  Electronically Signed by Marca Ancona MD on 12/29/2010 04:54:09 PM

## 2010-12-29 NOTE — Telephone Encounter (Signed)
Pt informed Pt' Assistance (Novolog Flexpen insulin) is ready for pickup.

## 2010-12-31 ENCOUNTER — Telehealth: Payer: Self-pay | Admitting: Internal Medicine

## 2010-12-31 ENCOUNTER — Ambulatory Visit (INDEPENDENT_AMBULATORY_CARE_PROVIDER_SITE_OTHER): Payer: Medicare Other | Admitting: Internal Medicine

## 2010-12-31 VITALS — BP 142/80 | HR 76 | Temp 98.2°F | Resp 16 | Ht 61.0 in | Wt 214.0 lb

## 2010-12-31 DIAGNOSIS — M9901 Segmental and somatic dysfunction of cervical region: Secondary | ICD-10-CM

## 2010-12-31 DIAGNOSIS — I2510 Atherosclerotic heart disease of native coronary artery without angina pectoris: Secondary | ICD-10-CM

## 2010-12-31 DIAGNOSIS — E1059 Type 1 diabetes mellitus with other circulatory complications: Secondary | ICD-10-CM

## 2010-12-31 DIAGNOSIS — IMO0001 Reserved for inherently not codable concepts without codable children: Secondary | ICD-10-CM

## 2010-12-31 DIAGNOSIS — K0889 Other specified disorders of teeth and supporting structures: Secondary | ICD-10-CM

## 2010-12-31 DIAGNOSIS — M9981 Other biomechanical lesions of cervical region: Secondary | ICD-10-CM

## 2010-12-31 DIAGNOSIS — I10 Essential (primary) hypertension: Secondary | ICD-10-CM

## 2010-12-31 DIAGNOSIS — M797 Fibromyalgia: Secondary | ICD-10-CM

## 2010-12-31 MED ORDER — AMOXICILLIN 500 MG PO CAPS: 500.0000 mg | ORAL_CAPSULE | Freq: Three times a day (TID) | ORAL | Status: AC

## 2010-12-31 MED ORDER — NABUMETONE 750 MG PO TABS: 750.0000 mg | ORAL_TABLET | Freq: Two times a day (BID) | ORAL | Status: DC

## 2010-12-31 NOTE — Telephone Encounter (Signed)
Pt is aware.  

## 2010-12-31 NOTE — Telephone Encounter (Signed)
May have referral per [name] and can send in amoxicillin 500 tid for 10 days per [name]

## 2010-12-31 NOTE — Telephone Encounter (Signed)
Was just and forgot to mention to [name] that she would like to be referred to [name] @ [facility]. She would like to consider removing all her teeth due to health issues. Her teeth bleeds every night and are painful. [name] had rx'd per Penicillian in the past to her her. She is requesting a refill to be [facility] to [facility]. Thanks. Please call her with advice.

## 2010-12-31 NOTE — Progress Notes (Signed)
Subjective:    Patient ID: Kristen [name], female    DOB: 12/28/1944, 65 y.o.   MRN: [id number]  HPI  Patient has severe degenerative joint disease of the spine with osteophytes at multiple levels the plain film x-rays of the neck suggests that much of her shoulder pain is referred pain from the neck because of the level of degeneration and spurring there is not one level that is an operative opportunity but more likely a long-term care she went in for muscle activation and anti-inflammatory.  Despite Coumadin therapy it should be safe to use Relafen 750 mg twice daily for anti-inflammatory and to start her on an ice and heat protocol  Review the patient's diabetes she states that her A1c slightly elevated she is followed by endocrinology.  Her blood pressure has been stable rate for ablation has been stable she does not have any change in her chest pain or shortness of breath  Review of Systems  Constitutional: Negative for activity change, appetite change and fatigue.  HENT: Negative for ear pain, congestion, neck pain, postnasal drip and sinus pressure.   Eyes: Negative for redness and visual disturbance.  Respiratory: Negative for cough, shortness of breath and wheezing.   Gastrointestinal: Negative for abdominal pain and abdominal distention.  Genitourinary: Negative for dysuria, frequency and menstrual problem.  Musculoskeletal: Positive for myalgias, back pain, joint swelling, arthralgias and gait problem.  Skin: Negative for rash and wound.  Neurological: Negative for dizziness, weakness and headaches.  Hematological: Negative for adenopathy. Does not bruise/bleed easily.  Psychiatric/Behavioral: Negative for sleep disturbance and decreased concentration.   Past Medical History  Diagnosis Date  . CAD (coronary artery disease)   . PAF (paroxysmal atrial fibrillation)   . Hypertension   . Hyperlipidemia   . GERD (gastroesophageal reflux disease)   . Hypokalemia   .  Diabetes mellitus   . Depression   . Lumbar back pain   . LBBB (left bundle branch block)   . Headache   . Lymphadenitis   . Arthritis   . Bursitis   . Chronic anticoagulation   . Heart palpitations   . Diastolic dysfunction     per echo in October 2012 with EF 50 to 55%   Past Surgical History  Procedure Date  . Angioplasty laminectomy  . Lumbar laminectomy   . Coronary artery bypass graft     LIMA to LAD   . Abdominal hysterectomy   . Cholecystectomy   . Tonsillectomy     reports that she quit smoking about 9 years ago. She does not have any smokeless tobacco history on file. She reports that she does not drink alcohol or use illicit drugs. family history includes Arthritis in an unspecified family member; Diabetes in an unspecified family member; Heart attack in her father; Heart disease in her father; Hypertension in an unspecified family member; Kidney disease in an unspecified family member; and Stroke in her mother and unspecified family member. Allergies  Allergen Reactions  . Codeine   . Doxycycline Hyclate   . Morphine   . Pentazocine Lactate   . Sulfonamide Derivatives        Objective:   Physical Exam  Nursing note and vitals reviewed. Constitutional: She is oriented to person, place, and time. She appears well-developed and well-nourished. No distress.  HENT:  Head: Normocephalic and atraumatic.  Right Ear: External ear normal.  Left Ear: External ear normal.  Nose: Nose normal.  Mouth/Throat: Oropharynx is clear and moist.  Eyes: Conjunctivae and  EOM are normal. Pupils are equal, round, and reactive to light.  Neck: Normal range of motion. Neck supple. No JVD present. No tracheal deviation present. No thyromegaly present.  Cardiovascular: Normal rate, regular rhythm, normal heart sounds and intact distal pulses.   No murmur heard. Pulmonary/Chest: Effort normal and breath sounds normal. She has no wheezes. She exhibits no tenderness.  Abdominal: Soft.  Bowel sounds are normal.  Musculoskeletal: She exhibits edema and tenderness.  Lymphadenopathy:    She has no cervical adenopathy.  Neurological: She is alert and oriented to person, place, and time. She has normal reflexes. No cranial nerve deficit.  Skin: Skin is warm and dry. She is not diaphoretic.  Psychiatric: She has a normal mood and affect. Her behavior is normal.          Assessment & Plan:  We reviewed the patient's Coumadin therapy as well as the potential risk and benefit of adding a nonsteroidal for pain control.  She does not wish to increase his narcotics at this time and we believe that with careful monitoring and use of PPIs that she should be safe to use Relafen for pain control.  We discussed the warning signs of dark stools increased abdominal pain any lightheadedness that would resuscitate her calling our office or if it were after hours and we can going to the emergency room and discontinuation of the nonsteroidal.  Otherwise her blood pressure is stable GERD is stable and PPI her diabetes appears to be moderately well controlled although I think depression plays a role in her not following her diabetic diet

## 2010-12-31 NOTE — Telephone Encounter (Signed)
I have sent in referral and sent med in if you will just call pt and let her know

## 2010-12-31 NOTE — Patient Instructions (Signed)
You are going to start Relafen 750 mg twice daily and begin a program at least twice daily and placing ice for 15 minutes at the base of the neck than heat for 15 minutes at the base of the eye and attached a listing of neck exercisesCervical   Sprain and Strain A cervical sprain is an injury to the neck. The injury can include either over-stretching or even small tears in the ligaments that hold the bones of the neck in place. A strain affects muscles and tendons. Minor injuries usually only involve ligaments and muscles. Because the different parts of the neck are so close together, more severe injuries can involve both sprain and strain. These injuries can affect the muscles, ligaments, tendons, discs, and nerves in the neck. CAUSES  An injury may be the result of a direct blow or from certain habits that can lead to the symptoms noted above.  Injury from:   Contact sports (such as football, rugby, wrestling, hockey, auto racing, gymnastics, diving, martial arts, and boxing).   Motor vehicle accidents.   Whiplash injuries (see image at right). These are common. They occur when the neck is forcefully whipped or forced backward and/or forward.   Falls.   Lifestyle or awkward postures:   Cradling a telephone between the ear and shoulder.   Sitting in a chair that offers no support.   Working at an Theme park manager station.   Activities that require hours of repeated or long periods of looking up (stretching the neck backward) or looking down (bending the head/neck forward).  SYMPTOMS   Pain, soreness, stiffness, or burning sensation in the front, back, or sides of the neck. This may develop immediately after injury. Onset of discomfort may also develop slowly and not begin for 24 hours or more.   Shoulder and/or upper back pain.   Limits to the normal movement of the neck.   Headache.   Dizziness.   Weakness and/or abnormal sensation (such as numbness or tingling) of one or  both arms and/or hands.   Muscle spasm.   Difficulty with swallowing or chewing.   Tenderness and swelling at the injury site.  DIAGNOSIS  Most of the time, your caregiver can diagnose this problem with a careful history and examination. The history will include information about known problems (such as arthritis in the neck) or a previous neck injury. X-rays may be ordered to find out if there is a different problem. X-rays can also help to find problems with the bones of the neck not related to the injury or current symptoms. TREATMENT  Several treatment options are available to help pain, spasm, and other symptoms. They include:  Cold helps relieve pain and reduce inflammation. Cold should be applied for 10 to 15 minutes every 2 to 3 hours after any activity that aggravates your symptoms. Use ice packs or an ice massage. Place a towel or cloth in between your skin and the ice pack.   Medication:   Only take over-the-counter or prescription medicines for pain, discomfort, or fever as directed by your caregiver.   Pain relievers or muscle relaxants may be prescribed. Use only as directed and only as much as you need.   Change in the activity that caused the problem. This might include using a headset with a telephone so that the phone is not propped between your ear and shoulder.   Neck collar. Your caregiver may recommend temporary use of a soft cervical collar.   Work station. Changes  may be needed in your work place. A better sitting position and/or better posture during work may be part of your treatment.   Physical Therapy. Your caregiver may recommend physical therapy. This can include instructions in the use of stretching and strengthening exercises. Improvement in posture is important. Exercises and posture training can help stabilize the neck and strengthen muscles and keep symptoms from returning.  HOME CARE INSTRUCTIONS  Other than formal physical therapy, all treatments above  can be done at home. Even when not at work, it is important to be conscious of your posture and of activities that can cause a return of symptoms. Most cervical sprains and/or strains are better in 1-3 weeks. As you improve and increase activities, doing a warm up and stretching before the activity will help prevent recurrent problems. SEEK MEDICAL CARE IF:   Pain is not effectively controlled with medication.   You feel unable to decrease pain medication over time as planned.   Activity level is not improving as planned and/or expected.  SEEK IMMEDIATE MEDICAL CARE IF:   While using medication, you develop any bleeding, stomach upset, or signs of an allergic reaction.   Symptoms get worse, become intolerable, and are not helped by medications.   New, unexplained symptoms develop.   You experience numbness, tingling, weakness, or paralysis of any part of your body.  MAKE SURE YOU:   Understand these instructions.   Will watch your condition.   Will get help right away if you are not doing well or get worse.  Document Released: 12/19/2006 Document Revised: 11/03/2010 Document Reviewed: 12/19/2006 [facility] Patient Information 2012 [place], [place].

## 2011-01-07 ENCOUNTER — Ambulatory Visit: Payer: Medicare Other | Admitting: Endocrinology

## 2011-01-07 ENCOUNTER — Ambulatory Visit: Payer: Medicare Other | Admitting: Cardiology

## 2011-01-07 ENCOUNTER — Encounter: Payer: Medicare Other | Admitting: *Deleted

## 2011-01-08 NOTE — Discharge Summary (Signed)
NAME:  ZALEA, PETE NO.:  [id number]  MEDICAL RECORD NO.:  [id number]  LOCATION:  2035                         FACILITY:  [facility]  PHYSICIAN:  Arturo Morton. Riley Kill, MD, FACCDATE OF BIRTH:  12/28/1944  DATE OF ADMISSION:  12/14/2010 DATE OF DISCHARGE:  12/16/2010                              DISCHARGE SUMMARY   PRIMARY CARDIOLOGIST:  Maisie Fus D. Sterling Big, MD, FA [facility]  PRIMARY CARE PROVIDER:  Stacie Glaze, MD  DISCHARGE DIAGNOSIS:  Tachy palpitations.  SECONDARY DIAGNOSES: 1. Coronary artery disease status post coronary artery bypass grafting     with prior placement of the left internal mammary artery to left     anterior descending. 2. Paroxysmal atrial fibrillation on Coumadin therapy. 3. Hypertension. 4. Hyperlipidemia. 5. Gastroesophageal reflux disease. 6. Depression. 7. Chronic low back pain. 8. Chronic left bundle-branch block.  ALLERGIES:  MORPHINE, CODEINE, SULFA, DOXYCYCLINE, TALWIN.  PROCEDURES:  2D echocardiogram performed December 14, 2010, EF 50% to 55% with grade 1 diastolic dysfunction.  Mild LVH.  HISTORY OF PRESENT ILLNESS:  A 65 year old obese African American female with the above problem list who over a 3-week period has been noticing increased tachy palpitations with rates sometimes into the 150s.  She monitors her pulse at home and describes palpitations as being irregular.  Symptoms were generally short lived and without other associated symptoms.  On the morning of admission, she woke at 5:15 am with tachy palpitations again noting rates in the 150s and irregularities.  At this time, it lasted longer than usual despite taking her home medications.  She presented to the [facility] ED where she was found to be in sinus rhythm.  Decision was made to admit her for further evaluation.  HOSPITAL COURSE:  The patient had no additional palpitations or any documentation of arrhythmias.  There is question as to whether or not she is  experiencing paroxysmal atrial fibrillation at home.  Her INR was subtherapeutic on admission at 1.44 and her Coumadin dose has been adjusted.  Further, we have increased her Toprol-XL to 100 mg b.i.d.  A 2D echocardiogram was performed on December 15, 2010, showing an EF of 50% to 55%.  Plan to discharge the patient home today in good condition. We have arranged for followup with Tereso Newcomer in our office next week at which point we will also follow up an INR, which is followed by Dr. Lovell Sheehan.  Of note, the patient, the patient had mild elevation of her TSH at 5.584 during this hospitalization.  We would recommend a repeat TSH when seen in the office.  DISCHARGE LABS:  Hemoglobin 13.5, hematocrit 38.7, WBC 5.7, platelets 286.  INR 1.92.  Sodium 138, potassium 3.7, chloride 102, CO2 of 22, BUN 19, creatinine 0.63, glucose 166, calcium 9.3.  Hemoglobin A1c 8.0, CK 155, MB 2.4, troponin-I less than 0.30.  TSH 5.584.  Digoxin 1.5.  DISPOSITION:  The patient will be discharged home today in good condition.  FOLLOWUP PLANS AND APPOINTMENTS:  The patient will be seen next week on December 24, 2010, at 10:30 a.m. by Tereso Newcomer, PA.  We will also check an INR and TSH at that time.  DISCHARGE MEDICATIONS: 1. Toprol-XL 100 mg b.i.d. 2. Alprazolam  1 mg t.i.d. 3. Aspirin 81 mg daily. 4. Atorvastatin 20 mg q. p.m. 5. Byetta 10 mcg subcu b.i.d. 6. Coumadin 6 mg 1 tab on Monday and Friday, half a tab on the     remaining days. 7. Cyclobenzaprine 10 mg b.i.d. 8. Digoxin 0.25 mg daily. 9. Gabapentin 300 mg t.i.d. 10.Hydrochlorothiazide 12.5 mg daily. 11.Hydrocodone/acetaminophen 10/325 mg q.6 h. p.r.n. 12.Nitroglycerin 0.4 mg sublingual p.r.n. chest pain. 13.NovoLog 50 units t.i.d. before meals. 14.Potassium chloride 20 mEq 1-1/2 tablets daily. 15.Prevacid 30 mg at bedtime p.r.n. 16.Spironolactone 25 mg b.i.d. 17.Telmisartan 40 mg daily.  OUTSTANDING LABS AND STUDIES:  Followup INR and TSH  on December 24, 2010.  DURATION OF DISCHARGE ENCOUNTER:  45 minutes including physician time.     Nicolasa Ducking, ANP   ______________________________ Arturo Morton. Riley Kill, MD, [facility]    CB/MEDQ  D:  12/16/2010  T:  12/16/2010  Job:  [id number]  cc:   Stacie Glaze, MD  Electronically Signed by Nicolasa Ducking ANP on 12/28/2010 04:15:56 PM Electronically Signed by Shawnie Pons MD [facility] on 01/08/2011 09:11:43 AM

## 2011-01-10 ENCOUNTER — Encounter: Payer: Self-pay | Admitting: Internal Medicine

## 2011-01-11 ENCOUNTER — Ambulatory Visit (INDEPENDENT_AMBULATORY_CARE_PROVIDER_SITE_OTHER): Payer: Medicare Other | Admitting: *Deleted

## 2011-01-11 DIAGNOSIS — I4891 Unspecified atrial fibrillation: Secondary | ICD-10-CM

## 2011-01-11 DIAGNOSIS — Z7901 Long term (current) use of anticoagulants: Secondary | ICD-10-CM

## 2011-01-14 ENCOUNTER — Ambulatory Visit: Payer: Medicare Other | Admitting: Endocrinology

## 2011-01-17 ENCOUNTER — Ambulatory Visit: Payer: Self-pay | Admitting: Endocrinology

## 2011-01-19 ENCOUNTER — Telehealth: Payer: Self-pay | Admitting: Internal Medicine

## 2011-01-19 MED ORDER — HYDROCODONE-ACETAMINOPHEN 10-325 MG PO TABS: 1.0000 | ORAL_TABLET | Freq: Four times a day (QID) | ORAL | Status: DC | PRN

## 2011-01-19 NOTE — Telephone Encounter (Signed)
Patient is on Byetta. She saw on TV that it may cause Pancreatic cancer. So, she discontinued taking it as of yesterday. She said that the news said if people are diabetic, that they she should call MD. Please advise. Also, she will need a refill of Hydrocodone.  [facility]. Thanks.

## 2011-01-19 NOTE — Telephone Encounter (Signed)
Per [name]- please byetta-being a diabetic puts you at risk of pancreatic cancer and he has not heard anything specific about byetta- hydrocodone called in as refill

## 2011-01-20 ENCOUNTER — Other Ambulatory Visit: Payer: Self-pay | Admitting: Internal Medicine

## 2011-01-25 ENCOUNTER — Ambulatory Visit (INDEPENDENT_AMBULATORY_CARE_PROVIDER_SITE_OTHER): Payer: Medicare Other | Admitting: Endocrinology

## 2011-01-25 ENCOUNTER — Encounter: Payer: Self-pay | Admitting: Endocrinology

## 2011-01-25 DIAGNOSIS — E1059 Type 1 diabetes mellitus with other circulatory complications: Secondary | ICD-10-CM

## 2011-01-25 MED ORDER — LEVOTHYROXINE SODIUM 25 MCG PO TABS: 25.0000 ug | ORAL_TABLET | Freq: Every day | ORAL | Status: DC

## 2011-01-25 NOTE — Patient Instructions (Addendum)
please incrase novolog to three times a day (just before each meal) 50-55-60 units. check your blood sugar 2 times a day.  vary the time of day when you check, between before the 3 meals, and at bedtime.  also check if you have symptoms of your blood sugar being too high or too low.  please keep a record of the readings and bring it to your next appointment here.  please call us sooner if you are having low blood sugar episodes. Please make a follow-up appointment in 3 months.   i have sent a prescription to your pharmacy, for a thyroid pill.

## 2011-01-25 NOTE — Progress Notes (Signed)
Subjective:    Patient ID: Kristen [name], female    DOB: 12/28/1944, 65 y.o.   MRN: [id number]  HPI Pt returns for f/u of insulin-requiring DM (2008).  states 2 weeks of moderate pain at all 4 limbs, and assoc insomnia.  She wants to try taking synthroid, to see if it helps her sxs.   no cbg record, but states cbg's are still highest at hs (200).  It is lowest in am.   Past Medical History  Diagnosis Date  . CAD (coronary artery disease)   . PAF (paroxysmal atrial fibrillation)   . Hypertension   . Hyperlipidemia   . GERD (gastroesophageal reflux disease)   . Hypokalemia   . Diabetes mellitus   . Depression   . Lumbar back pain   . LBBB (left bundle branch block)   . Headache   . Lymphadenitis   . Arthritis   . Bursitis   . Chronic anticoagulation   . Heart palpitations   . Diastolic dysfunction     per echo in October 2012 with EF 50 to 55%    Past Surgical History  Procedure Date  . Angioplasty laminectomy  . Lumbar laminectomy   . Coronary artery bypass graft     LIMA to LAD   . Abdominal hysterectomy   . Cholecystectomy   . Tonsillectomy     History   Social History  . Marital Status: Legally Separated    Spouse Name: N/A    Number of Children: N/A  . Years of Education: N/A   Occupational History  . Not on file.   Social History Main Topics  . Smoking status: Former Smoker    Quit date: 03/07/2001  . Smokeless tobacco: Not on file  . Alcohol Use: No  . Drug Use: No  . Sexually Active: Not on file   Other Topics Concern  . Not on file   Social History Narrative  . No narrative on file    Current Outpatient Prescriptions on File Prior to Visit  Medication Sig Dispense Refill  . ALPRAZolam (XANAX) 1 MG tablet Take 1 tablet (1 mg total) by mouth 3 (three) times daily.  90 tablet  3  . aspirin 81 MG tablet Take 81 mg by mouth daily.        Marland Kitchen atorvastatin (LIPITOR) 20 MG tablet Take 20 mg by mouth daily.        . B-D ULTRAFINE III SHORT  PEN 31G X 8 MM MISC USE 7 TIMES A DAY  200 each  1  . cyclobenzaprine (FLEXERIL) 10 MG tablet TAKE 1 TABLET BY MOUTH TWICE A DAY  60 tablet  0  . digoxin (LANOXIN) 0.25 MG tablet TAKE 1 TABLET EVERY DAY  90 tablet  2  . Exenatide (BYETTA 10 MCG PEN [place]) Inject into the skin 2 (two) times daily.        Marland Kitchen gabapentin (NEURONTIN) 300 MG capsule TAKE 1 CAPSULE THREE TIMES A DAY  90 capsule  2  . hydrochlorothiazide (,MICROZIDE/HYDRODIURIL,) 12.5 MG capsule TAKE ONE CAPSULE BY MOUTH EVERY DAY  90 capsule  3  . HYDROcodone-acetaminophen (NORCO) 10-325 MG per tablet Take 1 tablet by mouth every 6 (six) hours as needed.  120 tablet  3  . insulin aspart (NOVOLOG) 100 UNIT/ML injection Inject into the skin 3 (three) times daily before meals. 3x a day (just before each meal) 50-55-60  units      . KLOR-CON M20 20 MEQ tablet TAKE 1 & 1/2  TABLETS BY MOUTH ONCE A DAY  60 tablet  5  . lansoprazole (PREVACID) 30 MG capsule Take 30 mg by mouth as needed.       . metoprolol (TOPROL-XL) 100 MG 24 hr tablet        . nabumetone (RELAFEN) 750 MG tablet Take 1 tablet (750 mg total) by mouth 2 (two) times daily.  60 tablet  2  . nitroGLYCERIN (NITROSTAT) 0.4 MG SL tablet Place 1 tablet (0.4 mg total) under the tongue every 5 (five) minutes as needed.  25 tablet  3  . ONE TOUCH ULTRA TEST test strip USE 6 TIMES DAILY AS DIRECTED  200 each  4  . spironolactone (ALDACTONE) 25 MG tablet TAKE 1 TABLET TWICE DAILY  60 tablet  2  . telmisartan (MICARDIS) 40 MG tablet Take 40 mg by mouth daily.        Marland Kitchen warfarin (COUMADIN) 6 MG tablet Take 6 mg by mouth as directed.          Allergies  Allergen Reactions  . Codeine   . Doxycycline Hyclate   . Morphine   . Pentazocine Lactate   . Sulfonamide Derivatives     Family History  Problem Relation Age of Onset  . Heart attack Father   . Heart disease Father   . Kidney disease    . Stroke    . Arthritis    . Hypertension    . Diabetes    . Stroke Mother     BP 122/78   Pulse 75  Temp(Src) 97.8 F (36.6 C) (Oral)  Ht 5\' 1"  (1.549 m)  Wt 231 lb (104.781 kg)  BMI 43.65 kg/m2  SpO2 97%    Review of Systems Denies numbness. denies hypoglycemia.    Objective:   Physical Exam VITAL SIGNS:  See vs page GENERAL: no distress Pulses: dorsalis pedis intact bilat.   Feet: no deformity.  no ulcer on the feet.  feet are of normal color and temp.  Trace bilat leg edema Neuro: sensation is intact to touch on the feet.  Lab Results  Component Value Date   TSH 1.69 12/22/2010      Assessment & Plan:  Hypothyroidism, new Myalgias, unlikely thyroid-related DM, needs increased rx

## 2011-01-28 ENCOUNTER — Telehealth: Payer: Self-pay | Admitting: Cardiology

## 2011-01-28 NOTE — Telephone Encounter (Signed)
Patient called, wanting to know if she needs to keep appointment with Dr. Riley Kill this Tuesday.Stated she was feeling ok. Advised to keep appointment.

## 2011-01-28 NOTE — Telephone Encounter (Signed)
New problem Pt wants to know if she should come for her appt on Tuesday Please call

## 2011-01-31 ENCOUNTER — Other Ambulatory Visit: Payer: Self-pay | Admitting: *Deleted

## 2011-01-31 ENCOUNTER — Other Ambulatory Visit: Payer: Self-pay | Admitting: Internal Medicine

## 2011-01-31 MED ORDER — ALPRAZOLAM 1 MG PO TABS: 1.0000 mg | ORAL_TABLET | Freq: Three times a day (TID) | ORAL | Status: DC

## 2011-02-01 ENCOUNTER — Ambulatory Visit (INDEPENDENT_AMBULATORY_CARE_PROVIDER_SITE_OTHER): Payer: Medicare Other | Admitting: Cardiology

## 2011-02-01 ENCOUNTER — Encounter: Payer: Self-pay | Admitting: Cardiology

## 2011-02-01 ENCOUNTER — Other Ambulatory Visit: Payer: Self-pay | Admitting: Internal Medicine

## 2011-02-01 DIAGNOSIS — I10 Essential (primary) hypertension: Secondary | ICD-10-CM

## 2011-02-01 DIAGNOSIS — I2510 Atherosclerotic heart disease of native coronary artery without angina pectoris: Secondary | ICD-10-CM

## 2011-02-01 DIAGNOSIS — I4891 Unspecified atrial fibrillation: Secondary | ICD-10-CM

## 2011-02-01 DIAGNOSIS — E876 Hypokalemia: Secondary | ICD-10-CM

## 2011-02-01 LAB — BASIC METABOLIC PANEL
BUN: 19 mg/dL (ref 6–23)
Chloride: 105 mEq/L (ref 96–112)
Potassium: 4.2 mEq/L (ref 3.5–5.1)

## 2011-02-01 NOTE — Progress Notes (Signed)
HPI:  Kristen [name] seems to be doing better.  She denies any chest pain.  She is slowly beginning to regain her bearings psychologically after a long period impacted by the death of her grandson.  She appears much better.  She was recently hospitalized, but is stable at this point.  She does have a bit of a follicular type rash that is migratory, and she plans to see Dr. Arnold Long if it is related to warfarin.   Current Outpatient Prescriptions  Medication Sig Dispense Refill  . ALPRAZolam (XANAX) 1 MG tablet Take 1 tablet (1 mg total) by mouth 3 (three) times daily.  90 tablet  5  . aspirin 81 MG tablet Take 81 mg by mouth daily.        Marland Kitchen atorvastatin (LIPITOR) 20 MG tablet Take 20 mg by mouth daily.        . B-D ULTRAFINE III SHORT PEN 31G X 8 MM MISC USE 7 TIMES A DAY  200 each  1  . cyclobenzaprine (FLEXERIL) 10 MG tablet TAKE 1 TABLET BY MOUTH TWICE A DAY  60 tablet  0  . digoxin (LANOXIN) 0.25 MG tablet TAKE 1 TABLET EVERY DAY  90 tablet  2  . Exenatide (BYETTA 10 MCG PEN [place]) Inject into the skin 2 (two) times daily.        . hydrochlorothiazide (,MICROZIDE/HYDRODIURIL,) 12.5 MG capsule TAKE ONE CAPSULE BY MOUTH EVERY DAY  90 capsule  3  . HYDROcodone-acetaminophen (NORCO) 10-325 MG per tablet Take 1 tablet by mouth every 6 (six) hours as needed.  120 tablet  3  . insulin aspart (NOVOLOG) 100 UNIT/ML injection Inject into the skin 3 (three) times daily before meals. 3x a day (just before each meal) 50-55-60  units      . KLOR-CON M20 20 MEQ tablet TAKE 1 & 1/2 TABLETS BY MOUTH ONCE A DAY  60 tablet  5  . lansoprazole (PREVACID) 30 MG capsule Take 30 mg by mouth as needed.       Marland Kitchen levothyroxine (SYNTHROID, LEVOTHROID) 25 MCG tablet Take 1 tablet (25 mcg total) by mouth daily.  30 tablet  11  . metoprolol (TOPROL-XL) 100 MG 24 hr tablet Take 100 mg by mouth 2 (two) times daily.       . nabumetone (RELAFEN) 750 MG tablet Take 1 tablet (750 mg total) by mouth 2 (two) times daily.  60 tablet  2    . nitroGLYCERIN (NITROSTAT) 0.4 MG SL tablet Place 1 tablet (0.4 mg total) under the tongue every 5 (five) minutes as needed.  25 tablet  3  . ONE TOUCH ULTRA TEST test strip USE 6 TIMES DAILY AS DIRECTED  200 each  4  . spironolactone (ALDACTONE) 25 MG tablet TAKE 1 TABLET TWICE DAILY  60 tablet  2  . telmisartan (MICARDIS) 40 MG tablet Take 40 mg by mouth daily.        Marland Kitchen warfarin (COUMADIN) 6 MG tablet TAKE AS DIRECTED BY COUMADIN CLINIC.  30 tablet  6  . gabapentin (NEURONTIN) 300 MG capsule TAKE 1 CAPSULE THREE TIMES A DAY  90 capsule  2  . DISCONTD: warfarin (COUMADIN) 6 MG tablet TAKE AS DIRECTED BY COUMADIN CLINIC.  30 tablet  6    Allergies  Allergen Reactions  . Codeine   . Doxycycline Hyclate   . Morphine   . Pentazocine Lactate   . Sulfonamide Derivatives     Past Medical History  Diagnosis Date  . CAD (coronary artery  disease)   . PAF (paroxysmal atrial fibrillation)   . Hypertension   . Hyperlipidemia   . GERD (gastroesophageal reflux disease)   . Hypokalemia   . Diabetes mellitus   . Depression   . Lumbar back pain   . LBBB (left bundle branch block)   . Headache   . Lymphadenitis   . Arthritis   . Bursitis   . Chronic anticoagulation   . Heart palpitations   . Diastolic dysfunction     per echo in October 2012 with EF 50 to 55%    Past Surgical History  Procedure Date  . Angioplasty laminectomy  . Lumbar laminectomy   . Coronary artery bypass graft     LIMA to LAD   . Abdominal hysterectomy   . Cholecystectomy   . Tonsillectomy     Family History  Problem Relation Age of Onset  . Heart attack Father   . Heart disease Father   . Kidney disease    . Stroke    . Arthritis    . Hypertension    . Diabetes    . Stroke Mother     History   Social History  . Marital Status: Legally Separated    Spouse Name: N/A    Number of Children: N/A  . Years of Education: N/A   Occupational History  . Not on file.   Social History Main Topics  .  Smoking status: Former Smoker    Quit date: 03/07/2001  . Smokeless tobacco: Not on file  . Alcohol Use: No  . Drug Use: No  . Sexually Active: Not on file   Other Topics Concern  . Not on file   Social History Narrative  . No narrative on file    ROS: Please see the HPI.  All other systems reviewed and negative.  PHYSICAL EXAM:  BP 131/71  Pulse 85  Ht 5\' 1"  (1.549 m)  Wt 103.874 kg (229 lb)  BMI 43.27 kg/m2  General: Well developed, well nourished, in no acute distress. Head:  Normocephalic and atraumatic. Neck: no JVD Lungs: Clear to auscultation and percussion. Heart: Normal S1 and paradoxical splitting of S2.  No definite SEM.  No DM.   Abdomen:  Normal bowel sounds; soft; non tender; no organomegaly Pulses: Pulses normal in all 4 extremities. Extremities: No clubbing or cyanosis. No edema. Neurologic: Alert and oriented x 3. Skin:  Small areas of minor rash--follicular   EKG:  ASSESSMENT AND PLAN:

## 2011-02-01 NOTE — Assessment & Plan Note (Signed)
Reviewed meds were her in detail.  Told her if she gets dehydrated to hold Micardis, and spironolactone.  Will check BMET today.

## 2011-02-01 NOTE — Telephone Encounter (Signed)
Refill amoxicillin?

## 2011-02-01 NOTE — Telephone Encounter (Signed)
Pt is req to get a refill of amoxicillin (AMOXIL) [facility] MG [facility] [place]. Pt was prescribed this med because of bleeding gums. Pt is still having this problem and is still waiting to get in to see specialist that was previously discussed with [name]. Pls call in asap.

## 2011-02-01 NOTE — Assessment & Plan Note (Signed)
No current chest pain.  Stable at present.

## 2011-02-01 NOTE — Progress Notes (Signed)
Patient ID: Kristen [name], female   DOB: 12/28/1944, 65 y.o.   MRN: [id number]

## 2011-02-01 NOTE — Patient Instructions (Signed)
Your physician recommends that you have lab work today: Kristen [name]  Your physician recommends that you schedule a follow-up appointment in: 2 MONTHS  Your physician recommends that you continue on your current medications as directed. Please refer to the Current Medication list given to you today.

## 2011-02-01 NOTE — Assessment & Plan Note (Signed)
Maintaining NSR.  Remains on warfarin.

## 2011-02-02 ENCOUNTER — Other Ambulatory Visit: Payer: Self-pay | Admitting: *Deleted

## 2011-02-02 MED ORDER — AMOXICILLIN 500 MG PO CAPS: 500.0000 mg | ORAL_CAPSULE | Freq: Three times a day (TID) | ORAL | Status: AC

## 2011-02-03 NOTE — Telephone Encounter (Signed)
Ok to refill- med sent in and pt inrormed

## 2011-02-07 ENCOUNTER — Other Ambulatory Visit: Payer: Self-pay | Admitting: Internal Medicine

## 2011-02-08 ENCOUNTER — Ambulatory Visit (INDEPENDENT_AMBULATORY_CARE_PROVIDER_SITE_OTHER): Payer: Medicare Other | Admitting: *Deleted

## 2011-02-08 DIAGNOSIS — Z7901 Long term (current) use of anticoagulants: Secondary | ICD-10-CM

## 2011-02-08 DIAGNOSIS — I4891 Unspecified atrial fibrillation: Secondary | ICD-10-CM

## 2011-02-08 LAB — POCT INR: INR: 4.2

## 2011-02-21 ENCOUNTER — Other Ambulatory Visit: Payer: Self-pay | Admitting: *Deleted

## 2011-02-21 MED ORDER — POTASSIUM CHLORIDE CRYS ER 20 MEQ PO TBCR: EXTENDED_RELEASE_TABLET | ORAL | Status: DC

## 2011-02-22 ENCOUNTER — Ambulatory Visit (INDEPENDENT_AMBULATORY_CARE_PROVIDER_SITE_OTHER): Payer: Medicare Other | Admitting: *Deleted

## 2011-02-22 DIAGNOSIS — I4891 Unspecified atrial fibrillation: Secondary | ICD-10-CM

## 2011-02-22 DIAGNOSIS — Z7901 Long term (current) use of anticoagulants: Secondary | ICD-10-CM

## 2011-02-22 LAB — POCT INR: INR: 2.6

## 2011-02-23 ENCOUNTER — Telehealth: Payer: Self-pay | Admitting: Cardiology

## 2011-02-23 DIAGNOSIS — E7800 Pure hypercholesterolemia, unspecified: Secondary | ICD-10-CM

## 2011-02-23 NOTE — Telephone Encounter (Signed)
Try [phone] first after 4p, Pt calling re having lipids done, was going to [name] to have them done on 12-27 but to late in the day, can she get an order to have them done on 02-25-11

## 2011-02-24 NOTE — Telephone Encounter (Signed)
Order placed for lipid and liver on 02/25/11.  The pt tried to get this done through [name] office as directed by Dr Riley Kill but she cannot fast for her 03/03/11 appt with [name] at 11:45.  The pt said she needs to eat first thing in the morning.  I also made the pt aware that Dr Riley Kill reviewed information about her needing 13 teeth extracted.  Per documentation from Dr Riley Kill coumadin can be decreased to INR 1.7-2.0. Would not use epinephrine.  ASA can be held.  I will fax this note back to [facility].    The pt also said that she has been getting SOB when she walks and that her pulse increases when walking.  The pt is having a lot of knee, hip and back pain with walking and I made her aware that if she is in pain this can cause her pulse to increase. The pt's pulse should also increase when she is trying to walk for exercise.  The pt's denies a rapid pulse.  I also made her aware that the SOB can be coming from her increased weight.  The pt is going [place] try and walk at the mall for exercise.

## 2011-02-25 ENCOUNTER — Other Ambulatory Visit: Payer: Medicare Other | Admitting: *Deleted

## 2011-02-28 ENCOUNTER — Ambulatory Visit (INDEPENDENT_AMBULATORY_CARE_PROVIDER_SITE_OTHER): Payer: Medicare Other | Admitting: *Deleted

## 2011-02-28 DIAGNOSIS — E785 Hyperlipidemia, unspecified: Secondary | ICD-10-CM

## 2011-02-28 LAB — HEPATIC FUNCTION PANEL
ALT: 27 U/L (ref 0–35)
AST: 25 U/L (ref 0–37)
Albumin: 4.1 g/dL (ref 3.5–5.2)

## 2011-02-28 LAB — LIPID PANEL
HDL: 35.8 mg/dL — ABNORMAL LOW (ref >39.00)
Total CHOL/HDL Ratio: 5
Triglycerides: 165 mg/dL — ABNORMAL HIGH (ref 0.0–149.0)

## 2011-03-03 ENCOUNTER — Ambulatory Visit (INDEPENDENT_AMBULATORY_CARE_PROVIDER_SITE_OTHER): Payer: Medicare Other | Admitting: Internal Medicine

## 2011-03-03 ENCOUNTER — Encounter: Payer: Self-pay | Admitting: Internal Medicine

## 2011-03-03 DIAGNOSIS — R7989 Other specified abnormal findings of blood chemistry: Secondary | ICD-10-CM

## 2011-03-03 DIAGNOSIS — M1990 Unspecified osteoarthritis, unspecified site: Secondary | ICD-10-CM

## 2011-03-03 DIAGNOSIS — IMO0002 Reserved for concepts with insufficient information to code with codable children: Secondary | ICD-10-CM

## 2011-03-03 DIAGNOSIS — F329 Major depressive disorder, single episode, unspecified: Secondary | ICD-10-CM

## 2011-03-03 DIAGNOSIS — M5412 Radiculopathy, cervical region: Secondary | ICD-10-CM

## 2011-03-03 DIAGNOSIS — R635 Abnormal weight gain: Secondary | ICD-10-CM

## 2011-03-03 LAB — TSH: TSH: 1.41 u[IU]/mL (ref 0.35–5.50)

## 2011-03-03 MED ORDER — CELECOXIB 200 MG PO CAPS: 200.0000 mg | ORAL_CAPSULE | Freq: Two times a day (BID) | ORAL | Status: DC

## 2011-03-03 MED ORDER — ATORVASTATIN CALCIUM 20 MG PO TABS: 40.0000 mg | ORAL_TABLET | Freq: Every day | ORAL | Status: DC

## 2011-03-03 NOTE — Progress Notes (Signed)
Subjective:    Patient ID: Kristen [name], female    DOB: 12/28/1944, 65 y.o.   MRN: [id number]  HPI  Patient is a 65 year old African American female is followed for multiple medical problems including hypertension hyperlipidemia coronary artery disease and a question of hypothyroidism.  10 elevated TSH for which she started a low dose of Synthroid 25 mcg she has since stopped that medication and a repeat TSH was normal she has not had a T3 and T4 checked in a non-treatment date.  She has noticed since she stopped the thyroid increased musculoskeletal pain but she also admits that she stopped the Relafen due to cost and the fact that she is" doughnut hole"  Review of Systems  Constitutional: Negative for activity change, appetite change and fatigue.  HENT: Negative for ear pain, congestion, neck pain, postnasal drip and sinus pressure.   Eyes: Negative for redness and visual disturbance.  Respiratory: Negative for cough, shortness of breath and wheezing.   Gastrointestinal: Negative for abdominal pain and abdominal distention.  Genitourinary: Negative for dysuria, frequency and menstrual problem.  Musculoskeletal: Positive for myalgias, joint swelling and arthralgias.  Skin: Negative for rash and wound.  Neurological: Negative for dizziness, weakness and headaches.  Hematological: Negative for adenopathy. Does not bruise/bleed easily.  Psychiatric/Behavioral: Negative for sleep disturbance and decreased concentration.   Past Medical History  Diagnosis Date  . CAD (coronary artery disease)   . PAF (paroxysmal atrial fibrillation)   . Hypertension   . Hyperlipidemia   . GERD (gastroesophageal reflux disease)   . Hypokalemia   . Diabetes mellitus   . Depression   . Lumbar back pain   . LBBB (left bundle branch block)   . Headache   . Lymphadenitis   . Arthritis   . Bursitis   . Chronic anticoagulation   . Heart palpitations   . Diastolic dysfunction     per echo in  October 2012 with EF 50 to 55%    History   Social History  . Marital Status: Legally Separated    Spouse Name: N/A    Number of Children: N/A  . Years of Education: N/A   Occupational History  . Not on file.   Social History Main Topics  . Smoking status: Former Smoker    Quit date: 03/07/2001  . Smokeless tobacco: Not on file  . Alcohol Use: No  . Drug Use: No  . Sexually Active: Not on file   Other Topics Concern  . Not on file   Social History Narrative  . No narrative on file    Past Surgical History  Procedure Date  . Angioplasty laminectomy  . Lumbar laminectomy   . Coronary artery bypass graft     LIMA to LAD   . Abdominal hysterectomy   . Cholecystectomy   . Tonsillectomy     Family History  Problem Relation Age of Onset  . Heart attack Father   . Heart disease Father   . Kidney disease    . Stroke    . Arthritis    . Hypertension    . Diabetes    . Stroke Mother     Allergies  Allergen Reactions  . Codeine   . Doxycycline Hyclate   . Morphine   . Pentazocine Lactate   . Sulfonamide Derivatives     Current Outpatient Prescriptions on File Prior to Visit  Medication Sig Dispense Refill  . ALPRAZolam (XANAX) 1 MG tablet Take 1 tablet (1 mg total) by  mouth 3 (three) times daily.  90 tablet  5  . aspirin 81 MG tablet Take 81 mg by mouth daily.        . B-D ULTRAFINE III SHORT PEN 31G X 8 MM MISC USE 7 TIMES A DAY  200 each  1  . cyclobenzaprine (FLEXERIL) 10 MG tablet TAKE 1 TABLET BY MOUTH TWICE A DAY  60 tablet  0  . digoxin (LANOXIN) 0.25 MG tablet TAKE 1 TABLET EVERY DAY  90 tablet  2  . Exenatide (BYETTA 10 MCG PEN [place]) Inject into the skin 2 (two) times daily.        Marland Kitchen gabapentin (NEURONTIN) 300 MG capsule TAKE 1 CAPSULE THREE TIMES A DAY  90 capsule  2  . hydrochlorothiazide (,MICROZIDE/HYDRODIURIL,) 12.5 MG capsule TAKE ONE CAPSULE BY MOUTH EVERY DAY  90 capsule  3  . HYDROcodone-acetaminophen (NORCO) 10-325 MG per tablet Take 1  tablet by mouth every 6 (six) hours as needed.  120 tablet  3  . insulin aspart (NOVOLOG) 100 UNIT/ML injection Inject into the skin 3 (three) times daily before meals. 3x a day (just before each meal) 50-55-60  units      . lansoprazole (PREVACID) 30 MG capsule Take 30 mg by mouth as needed.       Marland Kitchen levothyroxine (SYNTHROID, LEVOTHROID) 25 MCG tablet Take 1 tablet (25 mcg total) by mouth daily.  30 tablet  11  . metoprolol (TOPROL-XL) 100 MG 24 hr tablet Take 100 mg by mouth 2 (two) times daily.       . nitroGLYCERIN (NITROSTAT) 0.4 MG SL tablet Place 1 tablet (0.4 mg total) under the tongue every 5 (five) minutes as needed.  25 tablet  3  . ONE TOUCH ULTRA TEST test strip USE 6 TIMES DAILY AS DIRECTED  200 each  4  . potassium chloride SA (KLOR-CON M20) 20 MEQ tablet Take 1.5 tabs daily   45 tablet  5  . spironolactone (ALDACTONE) 25 MG tablet TAKE 1 TABLET TWICE DAILY  60 tablet  2  . telmisartan (MICARDIS) 40 MG tablet Take 40 mg by mouth daily.        Marland Kitchen warfarin (COUMADIN) 6 MG tablet TAKE AS DIRECTED BY COUMADIN CLINIC.  30 tablet  6  . DISCONTD: atorvastatin (LIPITOR) 20 MG tablet Take 20 mg by mouth daily.          BP 130/80  Pulse 76  Temp 98.2 F (36.8 C)  Resp 16  Ht 5\' 1"  (1.549 m)  Wt 233 lb (105.688 kg)  BMI 44.02 kg/m2       Objective:   Physical Exam  Nursing note and vitals reviewed. Constitutional: She is oriented to person, place, and time. She appears well-developed and well-nourished. No distress.  HENT:  Head: Normocephalic and atraumatic.  Right Ear: External ear normal.  Left Ear: External ear normal.  Nose: Nose normal.  Mouth/Throat: Oropharynx is clear and moist.  Eyes: Conjunctivae and EOM are normal. Pupils are equal, round, and reactive to light.  Neck: Normal range of motion. Neck supple. No JVD present. No tracheal deviation present. No thyromegaly present.  Cardiovascular: Normal rate, regular rhythm, normal heart sounds and intact distal  pulses.   No murmur heard. Pulmonary/Chest: Effort normal and breath sounds normal. She has no wheezes. She exhibits no tenderness.  Abdominal: Soft. Bowel sounds are normal.  Musculoskeletal: Normal range of motion. She exhibits no edema and no tenderness.  Lymphadenopathy:    She has no  cervical adenopathy.  Neurological: She is alert and oriented to person, place, and time. She has normal reflexes. No cranial nerve deficit.  Skin: Skin is warm and dry. She is not diaphoretic.  Psychiatric: She has a normal mood and affect. Her behavior is normal.          Assessment & Plan:  We will assess her hyperthyroid diagnosis by getting a T3 free T4 he had a TSH done on non- medication state. This will give [place] a better picture whether or not she needs to take thyroid supplement.  Her weight gain is of concern it could be multifactorial due to depression 2 hypothyroidism due to lack of activity due to her arthritic pain.  We will give her samples of Celebrex 200 mg by mouth daily to take for the arthritic pain and encourage her to begin an aggressive walking program.

## 2011-03-03 NOTE — Patient Instructions (Signed)
The patient is instructed to continue all medications as prescribed. Schedule followup with check out clerk upon leaving the clinic  

## 2011-03-22 ENCOUNTER — Ambulatory Visit (INDEPENDENT_AMBULATORY_CARE_PROVIDER_SITE_OTHER): Payer: Medicare Other | Admitting: *Deleted

## 2011-03-22 DIAGNOSIS — Z7901 Long term (current) use of anticoagulants: Secondary | ICD-10-CM

## 2011-03-22 DIAGNOSIS — I4891 Unspecified atrial fibrillation: Secondary | ICD-10-CM

## 2011-04-08 ENCOUNTER — Ambulatory Visit (INDEPENDENT_AMBULATORY_CARE_PROVIDER_SITE_OTHER): Payer: Medicare Other | Admitting: Cardiology

## 2011-04-08 ENCOUNTER — Encounter: Payer: Self-pay | Admitting: Cardiology

## 2011-04-08 VITALS — BP 130/80 | HR 76 | Ht 62.0 in | Wt 232.8 lb

## 2011-04-08 DIAGNOSIS — F329 Major depressive disorder, single episode, unspecified: Secondary | ICD-10-CM

## 2011-04-08 DIAGNOSIS — E876 Hypokalemia: Secondary | ICD-10-CM

## 2011-04-08 DIAGNOSIS — I10 Essential (primary) hypertension: Secondary | ICD-10-CM

## 2011-04-08 DIAGNOSIS — I2510 Atherosclerotic heart disease of native coronary artery without angina pectoris: Secondary | ICD-10-CM

## 2011-04-08 DIAGNOSIS — I4891 Unspecified atrial fibrillation: Secondary | ICD-10-CM

## 2011-04-08 DIAGNOSIS — I447 Left bundle-branch block, unspecified: Secondary | ICD-10-CM

## 2011-04-08 LAB — BASIC METABOLIC PANEL
CO2: 25 mEq/L (ref 19–32)
Calcium: 9.6 mg/dL (ref 8.4–10.5)
Chloride: 99 mEq/L (ref 96–112)
Glucose, Bld: 190 mg/dL — ABNORMAL HIGH (ref 70–99)
Sodium: 135 mEq/L (ref 135–145)

## 2011-04-08 NOTE — Progress Notes (Signed)
HPI:  Cardiac wise she seems to be stable.  No current chest pain.  Since last seen she has seen Dr. Donzetta Starch for a derm issue with rash, and she got some cream and that has improved. She seems overall in better spirits, and seems to be slowly recovering from the loss of her grandson which was very difficult for her emotionally because of the close relationship they shared.    Current Outpatient Prescriptions  Medication Sig Dispense Refill  . ALPRAZolam (XANAX) 1 MG tablet Take 1 tablet (1 mg total) by mouth 3 (three) times daily.  90 tablet  5  . aspirin 81 MG tablet Take 81 mg by mouth daily.        Marland Kitchen atorvastatin (LIPITOR) 20 MG tablet Take 2 tablets (40 mg total) by mouth daily.  30 tablet  11  . B-D ULTRAFINE III SHORT PEN 31G X 8 MM MISC USE 7 TIMES A DAY  200 each  1  . cyclobenzaprine (FLEXERIL) 10 MG tablet TAKE 1 TABLET BY MOUTH TWICE A DAY  60 tablet  0  . digoxin (LANOXIN) 0.25 MG tablet TAKE 1 TABLET EVERY DAY  90 tablet  2  . Exenatide (BYETTA 10 MCG PEN ) Inject into the skin 2 (two) times daily.        Marland Kitchen gabapentin (NEURONTIN) 300 MG capsule TAKE 1 CAPSULE THREE TIMES A DAY  90 capsule  2  . hydrochlorothiazide (,MICROZIDE/HYDRODIURIL,) 12.5 MG capsule TAKE ONE CAPSULE BY MOUTH EVERY DAY  90 capsule  3  . HYDROcodone-acetaminophen (NORCO) 10-325 MG per tablet Take 1 tablet by mouth every 6 (six) hours as needed.  120 tablet  3  . insulin aspart (NOVOLOG) 100 UNIT/ML injection Inject into the skin 3 (three) times daily before meals. 3x a day (just before each meal) 50-55-60  units      . lansoprazole (PREVACID) 30 MG capsule Take 30 mg by mouth as needed.       . metoprolol (TOPROL-XL) 100 MG 24 hr tablet Take 100 mg by mouth 2 (two) times daily.       . nitroGLYCERIN (NITROSTAT) 0.4 MG SL tablet Place 1 tablet (0.4 mg total) under the tongue every 5 (five) minutes as needed.  25 tablet  3  . ONE TOUCH ULTRA TEST test strip USE 6 TIMES DAILY AS DIRECTED  200 each  4  .  potassium chloride SA (KLOR-CON M20) 20 MEQ tablet Take 1.5 tabs daily   45 tablet  5  . spironolactone (ALDACTONE) 25 MG tablet TAKE 1 TABLET TWICE DAILY  60 tablet  2  . telmisartan (MICARDIS) 40 MG tablet Take 40 mg by mouth daily.        Marland Kitchen warfarin (COUMADIN) 6 MG tablet TAKE AS DIRECTED BY COUMADIN CLINIC.  30 tablet  6    Allergies  Allergen Reactions  . Codeine   . Doxycycline Hyclate   . Morphine   . Pentazocine Lactate   . Sulfonamide Derivatives     Past Medical History  Diagnosis Date  . CAD (coronary artery disease)   . PAF (paroxysmal atrial fibrillation)   . Hypertension   . Hyperlipidemia   . GERD (gastroesophageal reflux disease)   . Hypokalemia   . Diabetes mellitus   . Depression   . Lumbar back pain   . LBBB (left bundle branch block)   . Headache   . Lymphadenitis   . Arthritis   . Bursitis   . Chronic anticoagulation   .  Heart palpitations   . Diastolic dysfunction     per echo in October 2012 with EF 50 to 55%    Past Surgical History  Procedure Date  . Angioplasty laminectomy  . Lumbar laminectomy   . Coronary artery bypass graft     LIMA to LAD   . Abdominal hysterectomy   . Cholecystectomy   . Tonsillectomy     Family History  Problem Relation Age of Onset  . Heart attack Father   . Heart disease Father   . Kidney disease    . Stroke    . Arthritis    . Hypertension    . Diabetes    . Stroke Mother     History   Social History  . Marital Status: Legally Separated    Spouse Name: N/A    Number of Children: N/A  . Years of Education: N/A   Occupational History  . Not on file.   Social History Main Topics  . Smoking status: Former Smoker    Quit date: 03/07/2001  . Smokeless tobacco: Not on file  . Alcohol Use: No  . Drug Use: No  . Sexually Active: Not on file   Other Topics Concern  . Not on file   Social History Narrative  . No narrative on file    ROS: Please see the HPI.  All other systems reviewed and  negative.  PHYSICAL EXAM:  BP 130/80  Pulse 76  Ht 5\' 2"  (1.575 m)  Wt 105.597 kg (232 lb 12.8 oz)  BMI 42.58 kg/m2  General: Well developed, well nourished, in no acute distress. Head:  Normocephalic and atraumatic. Neck: no JVD Lungs: Clear to auscultation and percussion. Heart: Normal S1 and Parodoxical S2.   Pulses: Pulses normal in all 4 extremities. Extremities: No clubbing or cyanosis. No edema. Neurologic: Alert and oriented x 3. Maculopapular rash.   EKG:  NSR.  LBBB.  No change from prior tracings.    ASSESSMENT AND PLAN:

## 2011-04-08 NOTE — Patient Instructions (Addendum)
Your physician recommends that you have lab work today: BMP  Your physician recommends that you schedule a follow-up appointment in: 3 MONTHS   Your physician recommends that you continue on your current medications as directed. Please refer to the Current Medication list given to you today.   

## 2011-04-14 ENCOUNTER — Other Ambulatory Visit: Payer: Self-pay | Admitting: *Deleted

## 2011-04-14 MED ORDER — CYCLOBENZAPRINE HCL 10 MG PO TABS: 10.0000 mg | ORAL_TABLET | Freq: Two times a day (BID) | ORAL | Status: DC | PRN

## 2011-04-14 MED ORDER — SPIRONOLACTONE 25 MG PO TABS: 25.0000 mg | ORAL_TABLET | Freq: Two times a day (BID) | ORAL | Status: DC

## 2011-04-19 ENCOUNTER — Encounter: Payer: Medicare Other | Admitting: *Deleted

## 2011-04-19 ENCOUNTER — Encounter: Payer: Self-pay | Admitting: Endocrinology

## 2011-04-20 ENCOUNTER — Ambulatory Visit (INDEPENDENT_AMBULATORY_CARE_PROVIDER_SITE_OTHER): Payer: Medicare Other | Admitting: *Deleted

## 2011-04-20 DIAGNOSIS — Z7901 Long term (current) use of anticoagulants: Secondary | ICD-10-CM

## 2011-04-20 DIAGNOSIS — I4891 Unspecified atrial fibrillation: Secondary | ICD-10-CM

## 2011-04-26 ENCOUNTER — Ambulatory Visit: Payer: Medicare Other | Admitting: Endocrinology

## 2011-04-29 NOTE — Assessment & Plan Note (Signed)
Well controlled on the current regimen.

## 2011-04-29 NOTE — Assessment & Plan Note (Signed)
Remains stable.  No current chest pain.  Prior CABG.

## 2011-04-29 NOTE — Assessment & Plan Note (Signed)
Needs frequent recheck of K.

## 2011-04-29 NOTE — Assessment & Plan Note (Signed)
Maintained on chronic anticoagulation.  No recurrence of arrhythmia clinically.

## 2011-04-29 NOTE — Assessment & Plan Note (Signed)
Chronic. 

## 2011-04-29 NOTE — Assessment & Plan Note (Signed)
Seems improved during our discussions.

## 2011-05-03 ENCOUNTER — Ambulatory Visit: Payer: Medicare Other | Admitting: Endocrinology

## 2011-05-10 ENCOUNTER — Other Ambulatory Visit: Payer: Self-pay | Admitting: *Deleted

## 2011-05-10 ENCOUNTER — Ambulatory Visit: Payer: Medicare Other | Admitting: Endocrinology

## 2011-05-10 MED ORDER — HYDROCODONE-ACETAMINOPHEN 10-325 MG PO TABS: 1.0000 | ORAL_TABLET | Freq: Four times a day (QID) | ORAL | Status: DC | PRN

## 2011-05-22 ENCOUNTER — Other Ambulatory Visit: Payer: Self-pay | Admitting: Internal Medicine

## 2011-05-23 ENCOUNTER — Other Ambulatory Visit: Payer: Self-pay | Admitting: Endocrinology

## 2011-05-24 ENCOUNTER — Other Ambulatory Visit: Payer: Self-pay | Admitting: Endocrinology

## 2011-05-24 ENCOUNTER — Ambulatory Visit: Payer: Medicare Other | Admitting: Endocrinology

## 2011-05-24 NOTE — Telephone Encounter (Signed)
The pt called and stated she needs an rx of insulin called in.  She could not make her appointment because the public bus she thought was coming to the office ended up taking a different route.  She re-scheduled for Friday.  She is [facility].  Thanks!

## 2011-05-24 NOTE — Telephone Encounter (Signed)
Rx for Novolog sent yesterday to [facility]. Left message informing pt rx sent and to callback office with any questions/concerns.

## 2011-05-27 ENCOUNTER — Other Ambulatory Visit (INDEPENDENT_AMBULATORY_CARE_PROVIDER_SITE_OTHER): Payer: Medicare Other

## 2011-05-27 ENCOUNTER — Encounter: Payer: Self-pay | Admitting: Endocrinology

## 2011-05-27 ENCOUNTER — Ambulatory Visit (INDEPENDENT_AMBULATORY_CARE_PROVIDER_SITE_OTHER): Payer: Medicare Other | Admitting: Endocrinology

## 2011-05-27 VITALS — BP 116/74 | HR 83 | Temp 96.9°F | Wt 233.0 lb

## 2011-05-27 DIAGNOSIS — E1059 Type 1 diabetes mellitus with other circulatory complications: Secondary | ICD-10-CM

## 2011-05-27 DIAGNOSIS — E039 Hypothyroidism, unspecified: Secondary | ICD-10-CM | POA: Insufficient documentation

## 2011-05-27 DIAGNOSIS — I798 Other disorders of arteries, arterioles and capillaries in diseases classified elsewhere: Secondary | ICD-10-CM

## 2011-05-27 NOTE — Progress Notes (Signed)
Subjective:    Patient ID: Kristen [name], female    DOB: June 14, 1945, 66 y.o.   MRN: [id number]  HPI Pt returns for f/u of insulin-requiring DM (2008).  no cbg record, but states cbg's vary from 120-268.  There is no trend throughout the day. She stopped the synthroid because her repeat tsh (on it) was normal. Pt states few mos of slight bleeding at the insulin sites at the anterior abdomen.  No assoc pain. Past Medical History  Diagnosis Date  . CAD (coronary artery disease)   . PAF (paroxysmal atrial fibrillation)   . Hypertension   . Hyperlipidemia   . GERD (gastroesophageal reflux disease)   . Hypokalemia   . Diabetes mellitus   . Depression   . Lumbar back pain   . LBBB (left bundle branch block)   . Headache   . Lymphadenitis   . Arthritis   . Bursitis   . Chronic anticoagulation   . Heart palpitations   . Diastolic dysfunction     per echo in October 2012 with EF 50 to 55%    Past Surgical History  Procedure Date  . Angioplasty laminectomy  . Lumbar laminectomy   . Coronary artery bypass graft     LIMA to LAD   . Abdominal hysterectomy   . Cholecystectomy   . Tonsillectomy     History   Social History  . Marital Status: Legally Separated    Spouse Name: N/A    Number of Children: N/A  . Years of Education: N/A   Occupational History  . Not on file.   Social History Main Topics  . Smoking status: Former Smoker    Quit date: 03/07/2001  . Smokeless tobacco: Not on file  . Alcohol Use: No  . Drug Use: No  . Sexually Active: Not on file   Other Topics Concern  . Not on file   Social History Narrative  . No narrative on file    Current Outpatient Prescriptions on File Prior to Visit  Medication Sig Dispense Refill  . ALPRAZolam (XANAX) 1 MG tablet Take 1 tablet (1 mg total) by mouth 3 (three) times daily.  90 tablet  5  . aspirin 81 MG tablet Take 81 mg by mouth daily.        Marland Kitchen atorvastatin (LIPITOR) 20 MG tablet Take 2 tablets (40 mg  total) by mouth daily.  30 tablet  11  . B-D ULTRAFINE III SHORT PEN 31G X 8 MM MISC USE 7 TIMES A DAY  200 each  1  . cyclobenzaprine (FLEXERIL) 10 MG tablet Take 1 tablet (10 mg total) by mouth 2 (two) times daily as needed for muscle spasms.  60 tablet  1  . digoxin (LANOXIN) 0.25 MG tablet TAKE 1 TABLET EVERY DAY  90 tablet  2  . Exenatide (BYETTA 10 MCG PEN [place]) Inject into the skin 2 (two) times daily.        Marland Kitchen gabapentin (NEURONTIN) 300 MG capsule TAKE 1 CAPSULE THREE TIMES A DAY  90 capsule  2  . hydrochlorothiazide (,MICROZIDE/HYDRODIURIL,) 12.5 MG capsule TAKE ONE CAPSULE BY MOUTH EVERY DAY  90 capsule  3  . HYDROcodone-acetaminophen (NORCO) 10-325 MG per tablet Take 1 tablet by mouth every 6 (six) hours as needed.  120 tablet  2  . insulin aspart (NOVOLOG) 100 UNIT/ML injection Inject into the skin three times a day (just before each meal) 55-60-65 units.      . metoprolol (TOPROL-XL) 100 MG 24  hr tablet Take 100 mg by mouth 2 (two) times daily.       . nitroGLYCERIN (NITROSTAT) 0.4 MG SL tablet Place 1 tablet (0.4 mg total) under the tongue every 5 (five) minutes as needed.  25 tablet  3  . ONE TOUCH ULTRA TEST test strip USE 6 TIMES DAILY AS DIRECTED  200 each  4  . potassium chloride SA (KLOR-CON M20) 20 MEQ tablet Take 1.5 tabs daily   45 tablet  5  . spironolactone (ALDACTONE) 25 MG tablet Take 1 tablet (25 mg total) by mouth 2 (two) times daily.  60 tablet  6  . telmisartan (MICARDIS) 40 MG tablet Take 40 mg by mouth daily.        Marland Kitchen warfarin (COUMADIN) 6 MG tablet TAKE AS DIRECTED BY COUMADIN CLINIC.  30 tablet  6  . lansoprazole (PREVACID) 30 MG capsule Take 30 mg by mouth as needed.         Allergies  Allergen Reactions  . Codeine   . Doxycycline Hyclate   . Morphine   . Pentazocine Lactate   . Sulfonamide Derivatives     Family History  Problem Relation Age of Onset  . Heart attack Father   . Heart disease Father   . Kidney disease    . Stroke    . Arthritis      . Hypertension    . Diabetes    . Stroke Mother     BP 116/74  Pulse 83  Temp(Src) 96.9 F (36.1 C) (Oral)  Wt 233 lb (105.688 kg)  SpO2 96%   Review of Systems denies hypoglycemia and brbpr     Objective:   Physical Exam VITAL SIGNS:  See vs page GENERAL: no distress.  Obese NECK: There is no palpable thyroid enlargement.  No thyroid nodule is palpable.  No palpable lymphadenopathy at the anterior neck. SKIN:  Insulin injection sites at the anterior abdomen are normal  Lab Results  Component Value Date   HGBA1C 10.3* 05/27/2011   Lab Results  Component Value Date   TSH 3.69 05/27/2011      Assessment & Plan:  Bleeding due to insulin injections, in a pt on coumadin.  This is seldom harmful. DM, needs increased rx Hypothyroidism, better.  She can stay-off synthroid for now

## 2011-05-27 NOTE — Patient Instructions (Addendum)
please increase novolog to three times a day (just before each meal) 55-60-65 units. blood tests are being requested for you today.  You will receive a letter with results. check your blood sugar 2 times a day.  vary the time of day when you check, between before the 3 meals, and at bedtime.  also check if you have symptoms of your blood sugar being too high or too low.  please keep a record of the readings and bring it to your next appointment here.  please call us sooner if you are having low blood sugar episodes. Please make a follow-up appointment in 3 months.   Try leaving the insulin needle in your skin for a few seconds, to minimize the bleeding.   (see letter)

## 2011-05-30 ENCOUNTER — Telehealth: Payer: Self-pay | Admitting: *Deleted

## 2011-05-30 MED ORDER — INSULIN ASPART 100 UNIT/ML ~~LOC~~ SOLN: SUBCUTANEOUS | Status: DC

## 2011-05-30 NOTE — Telephone Encounter (Signed)
Called pt to inform of A1c and TSH lab results. Pt informed of results and of increased Novolog dosage. (Letter also mailed to pt) Rx also sent to pharmacy for increased Novolog dosage.

## 2011-06-01 ENCOUNTER — Ambulatory Visit (INDEPENDENT_AMBULATORY_CARE_PROVIDER_SITE_OTHER): Payer: Medicare Other | Admitting: *Deleted

## 2011-06-01 DIAGNOSIS — I4891 Unspecified atrial fibrillation: Secondary | ICD-10-CM

## 2011-06-01 DIAGNOSIS — Z7901 Long term (current) use of anticoagulants: Secondary | ICD-10-CM

## 2011-06-27 ENCOUNTER — Other Ambulatory Visit: Payer: Self-pay | Admitting: Internal Medicine

## 2011-06-27 MED ORDER — AMOXICILLIN 500 MG PO CAPS: 500.0000 mg | ORAL_CAPSULE | Freq: Three times a day (TID) | ORAL | Status: AC

## 2011-06-27 NOTE — Telephone Encounter (Signed)
Per [name]- amoxicillin 500 tid for 10 days-pt informed and med sent in

## 2011-06-27 NOTE — Telephone Encounter (Signed)
Pt is requesting amoxicillin for tooth pain. Pt unable to see a dentist. [facility]. Pt has ov this friday

## 2011-06-28 ENCOUNTER — Telehealth: Payer: Self-pay | Admitting: *Deleted

## 2011-06-28 NOTE — Telephone Encounter (Signed)
Pt called about patient assistance for Novolog.

## 2011-06-29 NOTE — Telephone Encounter (Signed)
Printed Patient assistance application and informed pt to pickup application to fill out and provide necessary information and bring back to office to complete.

## 2011-07-01 ENCOUNTER — Encounter: Payer: Self-pay | Admitting: Internal Medicine

## 2011-07-01 ENCOUNTER — Ambulatory Visit (INDEPENDENT_AMBULATORY_CARE_PROVIDER_SITE_OTHER): Payer: Medicare Other | Admitting: Internal Medicine

## 2011-07-01 VITALS — BP 110/70 | HR 76 | Temp 98.2°F | Resp 18 | Ht 62.0 in | Wt 234.0 lb

## 2011-07-01 DIAGNOSIS — I2510 Atherosclerotic heart disease of native coronary artery without angina pectoris: Secondary | ICD-10-CM

## 2011-07-01 DIAGNOSIS — E039 Hypothyroidism, unspecified: Secondary | ICD-10-CM

## 2011-07-01 DIAGNOSIS — I798 Other disorders of arteries, arterioles and capillaries in diseases classified elsewhere: Secondary | ICD-10-CM

## 2011-07-01 DIAGNOSIS — E1059 Type 1 diabetes mellitus with other circulatory complications: Secondary | ICD-10-CM

## 2011-07-01 DIAGNOSIS — I10 Essential (primary) hypertension: Secondary | ICD-10-CM

## 2011-07-01 DIAGNOSIS — IMO0002 Reserved for concepts with insufficient information to code with codable children: Secondary | ICD-10-CM

## 2011-07-01 MED ORDER — EXENATIDE ER 2 MG ~~LOC~~ SUSR: 2.0000 mg | SUBCUTANEOUS | Status: DC

## 2011-07-01 MED ORDER — HYDROCODONE-ACETAMINOPHEN 10-325 MG PO TABS: 1.0000 | ORAL_TABLET | ORAL | Status: DC | PRN

## 2011-07-01 NOTE — Patient Instructions (Addendum)
The patient is instructed to continue all medications as prescribed. Schedule followup with check out clerk upon leaving the clinic Back Exercises Back exercises help treat and prevent back injuries. The goal of back exercises is to increase the strength of your abdominal and back muscles and the flexibility of your back. These exercises should be started when you no longer have back pain. Back exercises include:  Pelvic Tilt. Lie on your back with your knees bent. Tilt your pelvis until the lower part of your back is against the floor. Hold this position 5 to 10 sec and repeat 5 to 10 times.   Knee to Chest. Pull first 1 knee up against your chest and hold for 20 to 30 seconds, repeat this with the other knee, and then both knees. This may be done with the other leg straight or bent, whichever feels better.   Sit-Ups or Curl-Ups. Bend your knees 90 degrees. Start with tilting your pelvis, and do a partial, slow sit-up, lifting your trunk only 30 to 45 degrees off the floor. Take at least 2 to 3 seconds for each sit-up. Do not do sit-ups with your knees out straight. If partial sit-ups are difficult, simply do the above but with only tightening your abdominal muscles and holding it as directed.   Hip-Lift. Lie on your back with your knees flexed 90 degrees. Push down with your feet and shoulders as you raise your hips a couple inches off the floor; hold for 10 seconds, repeat 5 to 10 times.   Back arches. Lie on your stomach, propping yourself up on bent elbows. Slowly press on your hands, causing an arch in your low back. Repeat 3 to 5 times. Any initial stiffness and discomfort should lessen with repetition over time.   Shoulder-Lifts. Lie face down with arms beside your body. Keep hips and torso pressed to floor as you slowly lift your head and shoulders off the floor.  Do not overdo your exercises, especially in the beginning. Exercises may cause you some mild back discomfort which lasts for a  few minutes; however, if the pain is more severe, or lasts for more than 15 minutes, do not continue exercises until you see your caregiver. Improvement with exercise therapy for back problems is slow.    After therapy used ice for 15 min on the neck and the heat for 15 min See your caregivers for assistance with developing a proper back exercise program. Document Released: 03/31/2004 Document Revised: 02/10/2011 Document Reviewed: 02/21/2005 [facility] Patient Information 2012 [place], [place].

## 2011-07-01 NOTE — Progress Notes (Signed)
Subjective:    Patient ID: Kristen [name], female    DOB: [birth year], 66 y.o.   MRN: [id number]  HPI This is a 66 year old female who presents for followup of back pain hypertension hyperlipidemia and diabetes.  She has primary complaint today of increasing neck and back pain and neck pain radiates into her left upper chest.  The back pain radiates down the legs she has been seen by orthopedic and there was a suggestion that she was not a good operative candidate at this point.  It was recommended that she increase her pain medicine to 5 times a day this note was reviewed with the patient. She has had increased her A1c. He is followed by endocrinology   Review of Systems  Constitutional: Negative for activity change, appetite change and fatigue.  HENT: Negative for ear pain, congestion, neck pain, postnasal drip and sinus pressure.   Eyes: Negative for redness and visual disturbance.  Respiratory: Negative for cough, shortness of breath and wheezing.   Gastrointestinal: Negative for abdominal pain and abdominal distention.  Genitourinary: Negative for dysuria, frequency and menstrual problem.  Musculoskeletal: Positive for back pain and gait problem. Negative for myalgias, joint swelling and arthralgias.  Skin: Negative for rash and wound.  Neurological: Negative for dizziness, weakness and headaches.  Hematological: Negative for adenopathy. Does not bruise/bleed easily.  Psychiatric/Behavioral: Negative for sleep disturbance and decreased concentration.       Objective:   Physical Exam  Nursing note and vitals reviewed. Constitutional: She is oriented to person, place, and time. She appears well-developed and well-nourished. No distress.  HENT:  Head: Normocephalic and atraumatic.  Right Ear: External ear normal.  Left Ear: External ear normal.  Nose: Nose normal.  Mouth/Throat: Oropharynx is clear and moist.  Eyes: Conjunctivae and EOM are normal. Pupils are equal,  round, and reactive to light.  Neck: Normal range of motion. Neck supple. No JVD present. No tracheal deviation present. No thyromegaly present.  Cardiovascular: Normal rate, regular rhythm, normal heart sounds and intact distal pulses.   No murmur heard. Pulmonary/Chest: Effort normal and breath sounds normal. She has no wheezes. She exhibits no tenderness.  Abdominal: Soft. Bowel sounds are normal.  Musculoskeletal: She exhibits edema and tenderness.  Lymphadenopathy:    She has no cervical adenopathy.  Neurological: She is alert and oriented to person, place, and time. She has normal reflexes. No cranial nerve deficit.  Skin: Skin is warm and dry. She is not diaphoretic.  Psychiatric: She has a normal mood and affect. Her behavior is normal.     Past Medical History  Diagnosis Date  . CAD (coronary artery disease)   . PAF (paroxysmal atrial fibrillation)   . Hypertension   . Hyperlipidemia   . GERD (gastroesophageal reflux disease)   . Hypokalemia   . Diabetes mellitus   . Depression   . Lumbar back pain   . LBBB (left bundle branch block)   . Headache   . Lymphadenitis   . Arthritis   . Bursitis   . Chronic anticoagulation   . Heart palpitations   . Diastolic dysfunction     per echo in October 2012 with EF 50 to 55%    History   Social History  . Marital Status: Legally Separated    Spouse Name: N/A    Number of Children: N/A  . Years of Education: N/A   Occupational History  . Not on file.   Social History Main Topics  . Smoking status: Former Smoker  Quit date: 03/07/2001  . Smokeless tobacco: Not on file  . Alcohol Use: No  . Drug Use: No  . Sexually Active: Not on file   Other Topics Concern  . Not on file   Social History Narrative  . No narrative on file    Past Surgical History  Procedure Date  . Angioplasty laminectomy  . Lumbar laminectomy   . Coronary artery bypass graft     LIMA to LAD   . Abdominal hysterectomy   .  Cholecystectomy   . Tonsillectomy     Family History  Problem Relation Age of Onset  . Heart attack Father   . Heart disease Father   . Kidney disease    . Stroke    . Arthritis    . Hypertension    . Diabetes    . Stroke Mother     Allergies  Allergen Reactions  . Codeine   . Doxycycline Hyclate   . Morphine   . Pentazocine Lactate   . Sulfonamide Derivatives     Current Outpatient Prescriptions on File Prior to Visit  Medication Sig Dispense Refill  . ALPRAZolam (XANAX) 1 MG tablet Take 1 tablet (1 mg total) by mouth 3 (three) times daily.  90 tablet  5  . amoxicillin (AMOXIL) 500 MG capsule Take 1 capsule (500 mg total) by mouth 3 (three) times daily.  30 capsule  0  . aspirin 81 MG tablet Take 81 mg by mouth daily.        Marland Kitchen atorvastatin (LIPITOR) 20 MG tablet Take 2 tablets (40 mg total) by mouth daily.  30 tablet  11  . B-D ULTRAFINE III SHORT PEN 31G X 8 MM MISC USE 7 TIMES A DAY  200 each  1  . cyclobenzaprine (FLEXERIL) 10 MG tablet Take 1 tablet (10 mg total) by mouth 2 (two) times daily as needed for muscle spasms.  60 tablet  1  . digoxin (LANOXIN) 0.25 MG tablet TAKE 1 TABLET EVERY DAY  90 tablet  2  . gabapentin (NEURONTIN) 300 MG capsule TAKE 1 CAPSULE THREE TIMES A DAY  90 capsule  2  . hydrochlorothiazide (,MICROZIDE/HYDRODIURIL,) 12.5 MG capsule TAKE ONE CAPSULE BY MOUTH EVERY DAY  90 capsule  3  . insulin aspart (NOVOLOG FLEXPEN) 100 UNIT/ML injection Inject into the skin 3 times a day (just before each meal) 65-70-75 units.  75 mL  3  . lansoprazole (PREVACID) 30 MG capsule Take 30 mg by mouth as needed.       . metoprolol (TOPROL-XL) 100 MG 24 hr tablet Take 100 mg by mouth 2 (two) times daily.       . nitroGLYCERIN (NITROSTAT) 0.4 MG SL tablet Place 1 tablet (0.4 mg total) under the tongue every 5 (five) minutes as needed.  25 tablet  3  . ONE TOUCH ULTRA TEST test strip USE 6 TIMES DAILY AS DIRECTED  200 each  4  . potassium chloride SA (KLOR-CON M20) 20  MEQ tablet Take 1.5 tabs daily   45 tablet  5  . spironolactone (ALDACTONE) 25 MG tablet Take 1 tablet (25 mg total) by mouth 2 (two) times daily.  60 tablet  6  . telmisartan (MICARDIS) 40 MG tablet Take 40 mg by mouth daily.        Marland Kitchen warfarin (COUMADIN) 6 MG tablet TAKE AS DIRECTED BY COUMADIN CLINIC.  30 tablet  6  . DISCONTD: HYDROcodone-acetaminophen (NORCO) 10-325 MG per tablet Take 1 tablet by mouth  every 6 (six) hours as needed.  120 tablet  2  . DISCONTD: insulin aspart (NOVOLOG) 100 UNIT/ML injection Inject into the skin three times a day (just before each meal) 55-60-65 units.        BP 110/70  Pulse 76  Temp 98.2 F (36.8 C)  Resp 18  Ht 5\' 2"  (1.575 m)  Wt 234 lb (106.142 kg)  BMI 42.80 kg/m2        Assessment & Plan:  We will change the [place] to bydureon weekly for compliance and also possibly to help get the A1c faster. We have given her a copy of back exercises and recommend ice followed by sheet applied 15 minutes each to her neck and low back. I do not believe he is a candidate for peripheral steroid injection at this time  Due tolimited affect We will refill her hydrocodone at 5 tablets a day with 3 refills see her back in one month because of the change to the medication

## 2011-07-08 ENCOUNTER — Ambulatory Visit: Payer: Medicare Other | Admitting: Cardiology

## 2011-07-08 ENCOUNTER — Telehealth: Payer: Self-pay | Admitting: *Deleted

## 2011-07-08 NOTE — Telephone Encounter (Signed)
Diarrhea x 1 day and cancelled appt with Dr. Riley Kill, and wants Dr. Lovell Sheehan to give her RX for the diarrhea.

## 2011-07-08 NOTE — Telephone Encounter (Signed)
Pt given Dr. Lovell Sheehan instructions.

## 2011-07-08 NOTE — Telephone Encounter (Signed)
Per dr j Rudene Re try imodium ad otc-follow directions

## 2011-07-11 ENCOUNTER — Other Ambulatory Visit: Payer: Self-pay | Admitting: Internal Medicine

## 2011-07-11 DIAGNOSIS — Z1231 Encounter for screening mammogram for malignant neoplasm of breast: Secondary | ICD-10-CM

## 2011-07-18 ENCOUNTER — Telehealth: Payer: Self-pay | Admitting: Cardiology

## 2011-07-18 NOTE — Telephone Encounter (Signed)
Per pt - she reports having right sided pain behind shoulder blade and under her arm (yesterday and a few times before).  She states that yesterday she had some chest fullness that went into her throat but not today.  She has had some dizziness this am but none now.  She does not know her BP as she states her cuff is unreliable.  She will be going out in a little while and will check it at the pharmacy.  She states she has been having to take Metoprolol 100 mg BID and hydrocodone for pain as needed.  I suggested her dizziness maybe coming from the hydrocodone which she states she has been taking this for a long time and has never had that as a side effect.  Pt has an appointment with Dr Riley Kill tomorrow and will be seen then to evaluated her s/s.  She states she will report to the ED for evaluation if chest pain occurs.

## 2011-07-18 NOTE — Telephone Encounter (Signed)
New Problem:     Patient called in because she has been having some pain in her right arm and shoulder blade and in her upper chest/lower throat area and had some dizziness this morning that passed when she used the restroom.  Patient has an appointment to be seen tomorrow.  Please call back.

## 2011-07-19 ENCOUNTER — Encounter: Payer: Self-pay | Admitting: Cardiology

## 2011-07-19 ENCOUNTER — Ambulatory Visit (INDEPENDENT_AMBULATORY_CARE_PROVIDER_SITE_OTHER): Payer: Medicare Other | Admitting: *Deleted

## 2011-07-19 ENCOUNTER — Ambulatory Visit (INDEPENDENT_AMBULATORY_CARE_PROVIDER_SITE_OTHER): Payer: Medicare Other | Admitting: Cardiology

## 2011-07-19 VITALS — BP 110/72 | HR 72 | Ht 62.0 in | Wt 235.0 lb

## 2011-07-19 DIAGNOSIS — Z7901 Long term (current) use of anticoagulants: Secondary | ICD-10-CM

## 2011-07-19 DIAGNOSIS — R42 Dizziness and giddiness: Secondary | ICD-10-CM

## 2011-07-19 DIAGNOSIS — I2510 Atherosclerotic heart disease of native coronary artery without angina pectoris: Secondary | ICD-10-CM

## 2011-07-19 DIAGNOSIS — R0789 Other chest pain: Secondary | ICD-10-CM

## 2011-07-19 DIAGNOSIS — I4891 Unspecified atrial fibrillation: Secondary | ICD-10-CM

## 2011-07-19 NOTE — Progress Notes (Signed)
HPI:  The patient returns in followup. She's had pain mainly over a rib margin and extends over the R shoulder blade. It is not similar to what she has had in the past. She was sweaty with this, and wondered whether it could be related to her medications but she says she has been maintaining her level of hydration. She denies exertional symptoms.  Current Outpatient Prescriptions  Medication Sig Dispense Refill  . ALPRAZolam (XANAX) 1 MG tablet Take 1 tablet (1 mg total) by mouth 3 (three) times daily.  90 tablet  5  . aspirin 81 MG tablet Take 81 mg by mouth daily.        Marland Kitchen atorvastatin (LIPITOR) 20 MG tablet Take 2 tablets (40 mg total) by mouth daily.  30 tablet  11  . B-D ULTRAFINE III SHORT PEN 31G X 8 MM MISC USE 7 TIMES A DAY  200 each  1  . cyclobenzaprine (FLEXERIL) 10 MG tablet Take 1 tablet (10 mg total) by mouth 2 (two) times daily as needed for muscle spasms.  60 tablet  1  . digoxin (LANOXIN) 0.25 MG tablet TAKE 1 TABLET EVERY DAY  90 tablet  2  . gabapentin (NEURONTIN) 300 MG capsule TAKE 1 CAPSULE THREE TIMES A DAY  90 capsule  2  . hydrochlorothiazide (,MICROZIDE/HYDRODIURIL,) 12.5 MG capsule TAKE ONE CAPSULE BY MOUTH EVERY DAY  90 capsule  3  . HYDROcodone-acetaminophen (NORCO) 10-325 MG per tablet Take 1 tablet by mouth every 4 (four) hours as needed.  150 tablet  2  . insulin aspart (NOVOLOG FLEXPEN) 100 UNIT/ML injection Inject into the skin 3 times a day (just before each meal) 65-70-75 units.  75 mL  3  . lansoprazole (PREVACID) 30 MG capsule Take 30 mg by mouth as needed.       . metoprolol (TOPROL-XL) 100 MG 24 hr tablet Take 100 mg by mouth 2 (two) times daily.       . nitroGLYCERIN (NITROSTAT) 0.4 MG SL tablet Place 1 tablet (0.4 mg total) under the tongue every 5 (five) minutes as needed.  25 tablet  3  . ONE TOUCH ULTRA TEST test strip USE 6 TIMES DAILY AS DIRECTED  200 each  4  . potassium chloride SA (KLOR-CON M20) 20 MEQ tablet Take 1.5 tabs daily   45 tablet   5  . spironolactone (ALDACTONE) 25 MG tablet Take 1 tablet (25 mg total) by mouth 2 (two) times daily.  60 tablet  6  . telmisartan (MICARDIS) 40 MG tablet Take 40 mg by mouth daily.        Marland Kitchen warfarin (COUMADIN) 6 MG tablet TAKE AS DIRECTED BY COUMADIN CLINIC.  30 tablet  6  . Exenatide (BYDUREON) 2 MG SUSR Inject 2 mg into the skin once a week. Will start on Friday, May 17th        Allergies  Allergen Reactions  . Codeine   . Doxycycline Hyclate   . Morphine   . Pentazocine Lactate   . Sulfonamide Derivatives     Past Medical History  Diagnosis Date  . CAD (coronary artery disease)   . PAF (paroxysmal atrial fibrillation)   . Hypertension   . Hyperlipidemia   . GERD (gastroesophageal reflux disease)   . Hypokalemia   . Diabetes mellitus   . Depression   . Lumbar back pain   . LBBB (left bundle branch block)   . Headache   . Lymphadenitis   . Arthritis   .  Bursitis   . Chronic anticoagulation   . Heart palpitations   . Diastolic dysfunction     per echo in October 2012 with EF 50 to 55%    Past Surgical History  Procedure Date  . Angioplasty laminectomy  . Lumbar laminectomy   . Coronary artery bypass graft     LIMA to LAD   . Abdominal hysterectomy   . Cholecystectomy   . Tonsillectomy     Family History  Problem Relation Age of Onset  . Heart attack Father   . Heart disease Father   . Kidney disease    . Stroke    . Arthritis    . Hypertension    . Diabetes    . Stroke Mother     History   Social History  . Marital Status: Legally Separated    Spouse Name: N/A    Number of Children: N/A  . Years of Education: N/A   Occupational History  . Not on file.   Social History Main Topics  . Smoking status: Former Smoker    Quit date: 03/07/2001  . Smokeless tobacco: Not on file  . Alcohol Use: No  . Drug Use: No  . Sexually Active: Not on file   Other Topics Concern  . Not on file   Social History Narrative  . No narrative on file     ROS: Please see the HPI.  All other systems reviewed and negative.  PHYSICAL EXAM:  BP 110/72  Pulse 72  Ht 5\' 2"  (1.575 m)  Wt 235 lb (106.595 kg)  BMI 42.98 kg/m2  General: Well developed, well nourished, in no acute distress. Head:  Normocephalic and atraumatic. Neck: no JVD Lungs: Clear to auscultation and percussion. Heart: Normal S1 and paradoxical S2.  No murmur.   Pulses: Pulses normal in all 4 extremities. Extremities: No clubbing or cyanosis. No edema. Neurologic: Alert and oriented x 3.  EKG:  NSR.  LBBB.   ASSESSMENT AND PLAN:

## 2011-07-19 NOTE — Patient Instructions (Signed)
Your physician recommends that you have lab work today: BMP and CBC  Your physician recommends that you schedule a follow-up appointment in: 6 WEEKS  Your physician recommends that you continue on your current medications as directed. Please refer to the Current Medication list given to you today.     

## 2011-07-20 DIAGNOSIS — R42 Dizziness and giddiness: Secondary | ICD-10-CM | POA: Insufficient documentation

## 2011-07-20 DIAGNOSIS — R0789 Other chest pain: Secondary | ICD-10-CM | POA: Insufficient documentation

## 2011-07-20 LAB — BASIC METABOLIC PANEL
Calcium: 9.4 mg/dL (ref 8.4–10.5)
Creatinine, Ser: 0.9 mg/dL (ref 0.4–1.2)

## 2011-07-20 LAB — CBC WITH DIFFERENTIAL/PLATELET
Basophils Relative: 0.5 % (ref 0.0–3.0)
Eosinophils Absolute: 0.2 10*3/uL (ref 0.0–0.7)
Eosinophils Relative: 3.6 % (ref 0.0–5.0)
Lymphocytes Relative: 45.3 % (ref 12.0–46.0)
Neutrophils Relative %: 41 % — ABNORMAL LOW (ref 43.0–77.0)
RBC: 4.38 Mil/uL (ref 3.87–5.11)
WBC: 5.3 10*3/uL (ref 4.5–10.5)

## 2011-07-20 NOTE — Assessment & Plan Note (Signed)
See MS pain.  At this time, I would not likely pursue further workup unless progression of symptoms.

## 2011-07-20 NOTE — Assessment & Plan Note (Signed)
There is a component of this that might be orthostatic.  She needs to maintain hydration, and may need to come off of diuretics if symptoms persist.  It seems worse with getting up.  DIscussed.

## 2011-07-20 NOTE — Assessment & Plan Note (Signed)
The pain seems most likely skeletal in nature.  It is not similar to the past, and occurs and she has some tenderness near the scapula.  Local measures are suggested.  If she continues to have problems, she should call us.  She understands that her ECG cannot be interpreted.

## 2011-07-26 ENCOUNTER — Ambulatory Visit: Payer: Medicare Other

## 2011-07-26 ENCOUNTER — Other Ambulatory Visit: Payer: Self-pay | Admitting: Internal Medicine

## 2011-07-28 ENCOUNTER — Other Ambulatory Visit: Payer: Self-pay | Admitting: Internal Medicine

## 2011-07-28 ENCOUNTER — Telehealth: Payer: Self-pay | Admitting: *Deleted

## 2011-07-28 NOTE — Telephone Encounter (Signed)
Pt called requesting samples of Novolog and Bydureon per Dr. Lovell Sheehan. Pt informed samples ready for pickup.

## 2011-08-05 ENCOUNTER — Ambulatory Visit: Payer: Medicare Other

## 2011-08-06 ENCOUNTER — Other Ambulatory Visit: Payer: Self-pay | Admitting: Cardiovascular Disease

## 2011-08-08 ENCOUNTER — Other Ambulatory Visit: Payer: Self-pay | Admitting: Internal Medicine

## 2011-08-10 ENCOUNTER — Telehealth: Payer: Self-pay | Admitting: Internal Medicine

## 2011-08-10 NOTE — Telephone Encounter (Signed)
Patient called stating that she need a refill of her amoxicillin due to an abscess. Please advise.

## 2011-08-10 NOTE — Telephone Encounter (Signed)
Per [name]- needs to go to dentis have already filled 4 times

## 2011-08-10 NOTE — Telephone Encounter (Signed)
Pt informed that she can not continue with antibiotics for abscess --that is a serious situation and she needs to see a dentist

## 2011-08-12 ENCOUNTER — Ambulatory Visit: Payer: Medicare Other | Admitting: Internal Medicine

## 2011-08-16 ENCOUNTER — Emergency Department (HOSPITAL_COMMUNITY)
Admission: EM | Admit: 2011-08-16 | Discharge: 2011-08-17 | Disposition: A | Discharge status: Home / Self Care | Payer: Medicare Other | Attending: Emergency Medicine | Admitting: Emergency Medicine

## 2011-08-16 ENCOUNTER — Other Ambulatory Visit: Payer: Self-pay | Admitting: *Deleted

## 2011-08-16 DIAGNOSIS — E119 Type 2 diabetes mellitus without complications: Secondary | ICD-10-CM | POA: Insufficient documentation

## 2011-08-16 DIAGNOSIS — M129 Arthropathy, unspecified: Secondary | ICD-10-CM | POA: Insufficient documentation

## 2011-08-16 DIAGNOSIS — Z794 Long term (current) use of insulin: Secondary | ICD-10-CM | POA: Insufficient documentation

## 2011-08-16 DIAGNOSIS — K047 Periapical abscess without sinus: Secondary | ICD-10-CM | POA: Insufficient documentation

## 2011-08-16 DIAGNOSIS — Z79899 Other long term (current) drug therapy: Secondary | ICD-10-CM | POA: Insufficient documentation

## 2011-08-16 DIAGNOSIS — Z87891 Personal history of nicotine dependence: Secondary | ICD-10-CM | POA: Insufficient documentation

## 2011-08-16 DIAGNOSIS — K219 Gastro-esophageal reflux disease without esophagitis: Secondary | ICD-10-CM | POA: Insufficient documentation

## 2011-08-16 DIAGNOSIS — I2510 Atherosclerotic heart disease of native coronary artery without angina pectoris: Secondary | ICD-10-CM | POA: Insufficient documentation

## 2011-08-16 DIAGNOSIS — E785 Hyperlipidemia, unspecified: Secondary | ICD-10-CM | POA: Insufficient documentation

## 2011-08-16 DIAGNOSIS — I10 Essential (primary) hypertension: Secondary | ICD-10-CM | POA: Insufficient documentation

## 2011-08-16 NOTE — Telephone Encounter (Signed)
Pt is asking for refill of Amoxicillin previously given by PCP Dr. Lovell Sheehan for abscess in mouth. Pt states that she currently has no money to go to dentist until next month as advised by Dr. Lovell Sheehan, please advise.

## 2011-08-16 NOTE — Telephone Encounter (Signed)
Pt is asking for refill of Amoxicillin previously given by PCP Dr. Jenkins for abscess in mouth. Pt states that she currently has no money to go to dentist until next month as advised by Dr. Jenkins, please advise.  

## 2011-08-16 NOTE — Telephone Encounter (Signed)
i agree with dr Lurline Hare is a serious situation and she needs to see a dentist

## 2011-08-17 ENCOUNTER — Encounter (HOSPITAL_COMMUNITY): Payer: Self-pay | Admitting: *Deleted

## 2011-08-17 LAB — GLUCOSE, CAPILLARY: Glucose-Capillary: 148 mg/dL — ABNORMAL HIGH (ref 70–99)

## 2011-08-17 MED ORDER — CLINDAMYCIN PHOSPHATE 900 MG/50ML IV SOLN
900.0000 mg | Freq: Once | INTRAVENOUS | Status: AC
  Administered 2011-08-17: 900 mg via INTRAVENOUS
  Filled 2011-08-17 (×2): qty 50

## 2011-08-17 MED ORDER — ONDANSETRON HCL 4 MG/2ML IJ SOLN
4.0000 mg | Freq: Once | INTRAMUSCULAR | Status: AC
  Administered 2011-08-17: 4 mg via INTRAVENOUS
  Filled 2011-08-17: qty 2

## 2011-08-17 MED ORDER — SODIUM CHLORIDE 0.9 % IV BOLUS (SEPSIS)
1000.0000 mL | Freq: Once | INTRAVENOUS | Status: AC
  Administered 2011-08-17: 1000 mL via INTRAVENOUS

## 2011-08-17 MED ORDER — HYDROMORPHONE HCL PF 1 MG/ML IJ SOLN
1.0000 mg | Freq: Once | INTRAMUSCULAR | Status: AC
  Administered 2011-08-17: 1 mg via INTRAVENOUS
  Filled 2011-08-17: qty 1

## 2011-08-17 MED ORDER — CLINDAMYCIN HCL 300 MG PO CAPS: 300.0000 mg | ORAL_CAPSULE | Freq: Four times a day (QID) | ORAL | Status: DC

## 2011-08-17 MED ORDER — OXYCODONE-ACETAMINOPHEN 5-325 MG PO TABS: 1.0000 | ORAL_TABLET | Freq: Four times a day (QID) | ORAL | Status: DC | PRN

## 2011-08-17 NOTE — ED Notes (Signed)
The pt has had toothache for several days with swelling to the rt side of her face

## 2011-08-17 NOTE — ED Notes (Signed)
Patient is AOx4 and comfortable with her discharge instructions.  Patient has a ride home. 

## 2011-08-17 NOTE — ED Notes (Signed)
Pt c/o  Dental pain and swelling right face.  Is to have all top teeth  Extracted due to "dropping crowns because of my diabetes"  C/o pain 8/10 not relieved by hydrocodon.Kristen [name]

## 2011-08-17 NOTE — ED Provider Notes (Signed)
History     CSN: [id number]  Arrival date & time 08/16/11  2337   First MD Initiated Contact with Patient 08/17/11 0144      Chief Complaint  Patient presents with  . Dental Pain    HPI  History provided by the patient. Patient is a 66 year old female with history of hypertension, diabetes, CAD who presents with complaints of right upper dental pain and swelling of face. Patient has had increasing pain for the past few days with swelling that began yesterday. Patient has tried using warm rinses with Listerine without improvement. Pain is worse with movements or eating. Patient denies any other aggravating or alleviating factors. Patient denies any associated symptoms. She denies any fever, chills, sweats, difficulty swallowing or breathing.  Patient reports having a long history of poor upper dentition. Patient has had consult with oral surgeon recommending removal of all teeth in previous crowns. Patient currently cannot afford this.     Past Medical History  Diagnosis Date  . CAD (coronary artery disease)   . PAF (paroxysmal atrial fibrillation)   . Hypertension   . Hyperlipidemia   . GERD (gastroesophageal reflux disease)   . Hypokalemia   . Diabetes mellitus   . Depression   . Lumbar back pain   . LBBB (left bundle branch block)   . Headache   . Lymphadenitis   . Arthritis   . Bursitis   . Chronic anticoagulation   . Heart palpitations   . Diastolic dysfunction     per echo in October 2012 with EF 50 to 55%    Past Surgical History  Procedure Date  . Angioplasty laminectomy  . Lumbar laminectomy   . Coronary artery bypass graft     LIMA to LAD   . Abdominal hysterectomy   . Cholecystectomy   . Tonsillectomy     Family History  Problem Relation Age of Onset  . Heart attack Father   . Heart disease Father   . Kidney disease    . Stroke    . Arthritis    . Hypertension    . Diabetes    . Stroke Mother     History  Substance Use Topics  . Smoking  status: Former Smoker    Quit date: 03/07/2001  . Smokeless tobacco: Not on file  . Alcohol Use: No    OB History    Grav Para Term Preterm Abortions TAB SAB Ect Mult Living                  Review of Systems  Constitutional: Negative for fever and chills.  HENT: Positive for dental problem. Negative for trouble swallowing, neck pain and voice change.   Gastrointestinal: Negative for nausea and vomiting.    Allergies  Codeine; Doxycycline hyclate; Morphine; Pentazocine lactate; and Sulfonamide derivatives  Home Medications   Current Outpatient Rx  Name Route Sig Dispense Refill  . ALPRAZOLAM 1 MG PO TABS Oral Take 1 mg by mouth 3 (three) times daily.    . ASPIRIN 81 MG PO TABS Oral Take 81 mg by mouth daily.      . ATORVASTATIN CALCIUM 20 MG PO TABS Oral Take 40 mg by mouth daily.    . CYCLOBENZAPRINE HCL 10 MG PO TABS Oral Take 10 mg by mouth 2 (two) times daily as needed. For muscle spasms    . DIGOXIN 0.25 MG PO TABS Oral Take 250 mcg by mouth daily.    Marland Kitchen EXENATIDE 2 MG [place] SUSR  Subcutaneous Inject 2 mg into the skin once a week.     Marland Kitchen GABAPENTIN 300 MG PO CAPS Oral Take 300 mg by mouth 3 (three) times daily.    Marland Kitchen HYDROCHLOROTHIAZIDE 12.5 MG PO CAPS Oral Take 12.5 mg by mouth daily.    Marland Kitchen HYDROCODONE-ACETAMINOPHEN 10-325 MG PO TABS Oral Take 1 tablet by mouth every 4 (four) hours as needed. For pain    . INSULIN ASPART 100 UNIT/ML [place] SOLN Subcutaneous Inject 65-75 Units into the skin 3 (three) times daily before meals. Inject into the skin 3 times a day (just before each meal) 65-70-75 units.    Marland Kitchen LANSOPRAZOLE 30 MG PO CPDR Oral Take 30 mg by mouth daily as needed. For heartburn    . METOPROLOL SUCCINATE ER 100 MG PO TB24  TAKE 1 TABLET BY MOUTH TWICE A DAY 60 tablet 6  . NITROGLYCERIN 0.4 MG SL SUBL Sublingual Place 0.4 mg under the tongue every 5 (five) minutes as needed. For chest pain    . POTASSIUM CHLORIDE CRYS ER 20 MEQ PO TBCR Oral Take 25 mEq by mouth daily. Take 1.5  tabs daily    . SPIRONOLACTONE 25 MG PO TABS Oral Take 25 mg by mouth 2 (two) times daily.    . TELMISARTAN 40 MG PO TABS Oral Take 40 mg by mouth daily.      . WARFARIN SODIUM 6 MG PO TABS Oral Take 3-6 mg by mouth daily. 6mg  on mondays 3 mg all other days      BP 124/83  Pulse 83  Temp 99.1 F (37.3 C) (Oral)  Resp 19  SpO2 96%  Physical Exam  Nursing note and vitals reviewed. Constitutional: She is oriented to person, place, and time. She appears well-developed and well-nourished. No distress.  HENT:  Head: Normocephalic.       Poor dentition throughout. There is decay of several teeth including right upper first premolar. There is adjacent swelling of lateral comments to the right upper first premolar. She has mild erythema over right cheek with loss of nasolabial fold. Area is tender to palpation  Eyes: Conjunctivae and EOM are normal. Pupils are equal, round, and reactive to light.       No signs for periorbital cellulitis  Neck: Normal range of motion. Neck supple.  Cardiovascular: Normal rate.  An irregularly irregular rhythm present.  Pulmonary/Chest: Effort normal and breath sounds normal.  Lymphadenopathy:    She has no cervical adenopathy.  Neurological: She is alert and oriented to person, place, and time.  Skin: Skin is warm.  Psychiatric: She has a normal mood and affect. Her behavior is normal.    ED Course  Procedures   Results for orders placed during the hospital encounter of 08/16/11  GLUCOSE, CAPILLARY      Component Value Range   Glucose-Capillary 180 (*) 70 - 99 mg/dL   Comment 1 Documented in Chart     Comment 2 Notify RN    GLUCOSE, CAPILLARY      Component Value Range   Glucose-Capillary 148 (*) 70 - 99 mg/dL     1. Dental abscess       MDM  Patient seen and evaluated. Patient no acute distress. Will give dose of antibiotics.    Patient given dental referral.  Angus Seller, [place] 08/18/11 [phone]

## 2011-08-17 NOTE — Discharge Instructions (Signed)
She was seen and evaluated today for your dental pain and swelling. Your given antibiotics and IV for your infection. Your providers feel you may return home and followup with the dentist for continued evaluation and treatment. Please call tomorrow to schedule an appointment. You've also been given antibiotics to take for the next week. Please take these as prescribed for the full length of time. Return to emergency room for any worsening symptoms, increased swelling, fever, chills, persistent nausea vomiting.   Dental Abscess A dental abscess usually starts from an infected tooth. Antibiotic medicine and pain pills can be helpful, but dental infections require the attention of a dentist. Rinse around the infected area often with salt water (a pinch of salt in 8 oz of warm water). Do not apply heat to the outside of your face. See your dentist or oral surgeon as soon as possible.  SEEK IMMEDIATE MEDICAL CARE IF:  You have increasing, severe pain that is not relieved by medicine.   You or your child has an oral temperature above 102 F (38.9 C), not controlled by medicine.   Your baby is older than 3 months with a rectal temperature of 102 F (38.9 C) or higher.   Your baby is 15 months old or younger with a rectal temperature of 100.4 F (38 C) or higher.   You develop chills, severe headache, difficulty breathing, or trouble swallowing.   You have swelling in the neck or around the eye.  Document Released: 02/21/2005 Document Revised: 02/10/2011 Document Reviewed: 08/02/2006 [facility] Patient Information 2012 [place], [place].

## 2011-08-17 NOTE — Telephone Encounter (Signed)
Pt informed of MD's advisement regarding ABX. Samples of Novolog also placed in fridge for pt to pickup.

## 2011-08-19 ENCOUNTER — Ambulatory Visit: Payer: Medicare Other

## 2011-08-19 NOTE — ED Provider Notes (Signed)
Medical screening examination/treatment/procedure(s) were performed by non-physician practitioner and as supervising physician I was immediately available for consultation/collaboration.  Olivia Mackie, MD 08/19/11 1520

## 2011-08-21 ENCOUNTER — Emergency Department (HOSPITAL_COMMUNITY)
Admission: EM | Admit: 2011-08-21 | Discharge: 2011-08-22 | Disposition: A | Discharge status: Home / Self Care | Payer: Medicare Other | Attending: Emergency Medicine | Admitting: Emergency Medicine

## 2011-08-21 ENCOUNTER — Encounter (HOSPITAL_COMMUNITY): Payer: Self-pay | Admitting: Emergency Medicine

## 2011-08-21 ENCOUNTER — Other Ambulatory Visit: Payer: Self-pay

## 2011-08-21 DIAGNOSIS — I4891 Unspecified atrial fibrillation: Secondary | ICD-10-CM | POA: Insufficient documentation

## 2011-08-21 DIAGNOSIS — E785 Hyperlipidemia, unspecified: Secondary | ICD-10-CM | POA: Insufficient documentation

## 2011-08-21 DIAGNOSIS — Z79899 Other long term (current) drug therapy: Secondary | ICD-10-CM | POA: Insufficient documentation

## 2011-08-21 DIAGNOSIS — Z87891 Personal history of nicotine dependence: Secondary | ICD-10-CM | POA: Insufficient documentation

## 2011-08-21 DIAGNOSIS — R42 Dizziness and giddiness: Secondary | ICD-10-CM | POA: Insufficient documentation

## 2011-08-21 DIAGNOSIS — E119 Type 2 diabetes mellitus without complications: Secondary | ICD-10-CM | POA: Insufficient documentation

## 2011-08-21 DIAGNOSIS — Z794 Long term (current) use of insulin: Secondary | ICD-10-CM | POA: Insufficient documentation

## 2011-08-21 DIAGNOSIS — Z7901 Long term (current) use of anticoagulants: Secondary | ICD-10-CM | POA: Insufficient documentation

## 2011-08-21 DIAGNOSIS — I10 Essential (primary) hypertension: Secondary | ICD-10-CM | POA: Insufficient documentation

## 2011-08-21 DIAGNOSIS — I2510 Atherosclerotic heart disease of native coronary artery without angina pectoris: Secondary | ICD-10-CM | POA: Insufficient documentation

## 2011-08-21 DIAGNOSIS — K219 Gastro-esophageal reflux disease without esophagitis: Secondary | ICD-10-CM | POA: Insufficient documentation

## 2011-08-21 LAB — DIFFERENTIAL
Basophils Relative: 1 % (ref 0–1)
Monocytes Absolute: 0.6 10*3/uL (ref 0.1–1.0)
Monocytes Relative: 9 % (ref 3–12)
Neutro Abs: 2.9 10*3/uL (ref 1.7–7.7)

## 2011-08-21 LAB — DIGOXIN LEVEL: Digoxin Level: 0.6 ng/mL — ABNORMAL LOW (ref 0.8–2.0)

## 2011-08-21 LAB — CBC
HCT: 38.3 % (ref 36.0–46.0)
Hemoglobin: 13.4 g/dL (ref 12.0–15.0)
MCHC: 35 g/dL (ref 30.0–36.0)

## 2011-08-21 LAB — BASIC METABOLIC PANEL
BUN: 11 mg/dL (ref 6–23)
CO2: 23 mEq/L (ref 19–32)
Chloride: 101 mEq/L (ref 96–112)
Creatinine, Ser: 0.74 mg/dL (ref 0.50–1.10)
GFR calc Af Amer: >90 mL/min (ref >90)
Glucose, Bld: 115 mg/dL — ABNORMAL HIGH (ref 70–99)

## 2011-08-21 MED ORDER — SODIUM CHLORIDE 0.9 % IV BOLUS (SEPSIS)
250.0000 mL | Freq: Once | INTRAVENOUS | Status: AC
  Administered 2011-08-22: 1000 mL via INTRAVENOUS

## 2011-08-21 NOTE — ED Notes (Signed)
C/o feeling lightheaded and sob since 1:30pm today.  Reports L hand pain that radiated up L forearm at 10:30am and lasted approx 10 min.  Also reports that she had an anxiety attack at 7:30pm tonight.

## 2011-08-21 NOTE — ED Provider Notes (Addendum)
History     CSN: [id number]  Arrival date & time 08/21/11  2100   First MD Initiated Contact with Patient 08/21/11 2300      Chief Complaint  Patient presents with  . Dizziness  . Shortness of Breath    (Consider location/radiation/quality/duration/timing/severity/associated sxs/prior treatment) HPI Comments: Patient presents with symptoms of lightheadedness.  She notes that she's felt lightheaded throughout the day.  It is a feeling like she might pass out although she's never actually had a syncopal episode today.  It was worse with standing and improved with sitting down.  No associated nausea or vomiting except when she does take her clindamycin for a recent dental abscess.  She notes she's had some watery loose stools since starting the clindamycin as well.  She noted her heart rate to be just up over 100 and her cardiologist told her that if her heart rate is elevated to come to the emergency department for evaluation.  She also held her diuretic today per his instructions that if she felt dehydrated to hold it.  Patient has no focal abdominal pain.  No fevers.  She did have some left hand and forearm pain that lasted 10-15 minutes this morning that is spontaneously resolve.  No chest pain.  Patient is a 66 y.o. female presenting with shortness of breath. The history is provided by the patient.  Shortness of Breath  Associated symptoms include shortness of breath. Pertinent negatives include no chest pain, no fever and no cough.    Past Medical History  Diagnosis Date  . CAD (coronary artery disease)   . PAF (paroxysmal atrial fibrillation)   . Hypertension   . Hyperlipidemia   . GERD (gastroesophageal reflux disease)   . Hypokalemia   . Diabetes mellitus   . Depression   . Lumbar back pain   . LBBB (left bundle branch block)   . Headache   . Lymphadenitis   . Arthritis   . Bursitis   . Chronic anticoagulation   . Heart palpitations   . Diastolic dysfunction     per  echo in October 2012 with EF 50 to 55%    Past Surgical History  Procedure Date  . Angioplasty laminectomy  . Lumbar laminectomy   . Coronary artery bypass graft     LIMA to LAD   . Abdominal hysterectomy   . Cholecystectomy   . Tonsillectomy     Family History  Problem Relation Age of Onset  . Heart attack Father   . Heart disease Father   . Kidney disease    . Stroke    . Arthritis    . Hypertension    . Diabetes    . Stroke Mother     History  Substance Use Topics  . Smoking status: Former Smoker    Quit date: 03/07/2001  . Smokeless tobacco: Not on file  . Alcohol Use: No    OB History    Grav Para Term Preterm Abortions TAB SAB Ect Mult Living                  Review of Systems  Constitutional: Negative.  Negative for fever and chills.  HENT: Negative.   Eyes: Negative.   Respiratory: Positive for shortness of breath. Negative for cough.   Cardiovascular: Positive for palpitations. Negative for chest pain.  Gastrointestinal: Positive for nausea and diarrhea. Negative for vomiting and abdominal pain.  Genitourinary: Negative.  Negative for dysuria.  Musculoskeletal: Negative.  Negative for back  pain.  Skin: Negative.  Negative for color change and rash.  Neurological: Positive for light-headedness. Negative for syncope and headaches.  Hematological: Negative.  Negative for adenopathy.  Psychiatric/Behavioral: Negative for confusion. The patient is nervous/anxious.   All other systems reviewed and are negative.    Allergies  Codeine; Doxycycline hyclate; Morphine; Pentazocine lactate; and Sulfonamide derivatives  Home Medications   Current Outpatient Rx  Name Route Sig Dispense Refill  . ALPRAZOLAM 1 MG PO TABS Oral Take 1 mg by mouth 3 (three) times daily.    . ASPIRIN 81 MG PO TABS Oral Take 81 mg by mouth daily.      . ATORVASTATIN CALCIUM 20 MG PO TABS Oral Take 40 mg by mouth daily.    Marland Kitchen CLINDAMYCIN HCL 300 MG PO CAPS Oral Take 1 capsule (300  mg total) by mouth 4 (four) times daily. X 7 days 28 capsule 0  . CYCLOBENZAPRINE HCL 10 MG PO TABS Oral Take 10 mg by mouth 2 (two) times daily as needed. For muscle spasms    . DIGOXIN 0.25 MG PO TABS Oral Take 250 mcg by mouth daily.    Marland Kitchen EXENATIDE 2 MG Julian SUSR Subcutaneous Inject 2 mg into the skin once a week. On mondays    . GABAPENTIN 300 MG PO CAPS Oral Take 300 mg by mouth 3 (three) times daily.    Marland Kitchen HYDROCHLOROTHIAZIDE 12.5 MG PO CAPS Oral Take 12.5 mg by mouth daily.    Marland Kitchen HYDROCODONE-ACETAMINOPHEN 10-325 MG PO TABS Oral Take 1 tablet by mouth every 4 (four) hours as needed. For pain    . INSULIN ASPART 100 UNIT/ML [place] SOLN Subcutaneous Inject 65-75 Units into the skin 3 (three) times daily before meals. Inject into the skin 3 times a day (just before each meal) 65-70-75 units.    Marland Kitchen LANSOPRAZOLE 30 MG PO CPDR Oral Take 30 mg by mouth daily as needed. For heartburn    . METOPROLOL SUCCINATE ER 100 MG PO TB24  TAKE 1 TABLET BY MOUTH TWICE A DAY 60 tablet 6  . NITROGLYCERIN 0.4 MG SL SUBL Sublingual Place 0.4 mg under the tongue every 5 (five) minutes as needed. For chest pain    . OXYCODONE-ACETAMINOPHEN 5-325 MG PO TABS Oral Take 1-2 tablets by mouth every 6 (six) hours as needed for pain. 20 tablet 0  . POTASSIUM CHLORIDE CRYS ER 20 MEQ PO TBCR Oral Take 25 mEq by mouth daily. Take 1.5 tabs daily    . SPIRONOLACTONE 25 MG PO TABS Oral Take 25 mg by mouth 2 (two) times daily.    . TELMISARTAN 40 MG PO TABS Oral Take 40 mg by mouth daily.      . WARFARIN SODIUM 6 MG PO TABS Oral Take 3-6 mg by mouth daily. 6mg  on mondays 3 mg all other days      BP 128/52  Pulse 75  Temp 98.2 F (36.8 C) (Oral)  Resp 20  Ht 5\' 2"  (1.575 m)  Wt 234 lb (106.142 kg)  BMI 42.80 kg/m2  SpO2 99%  Physical Exam  Nursing note and vitals reviewed. Constitutional: She is oriented to person, place, and time. She appears well-developed and well-nourished.  Non-toxic appearance. She does not have a sickly  appearance.  HENT:  Head: Normocephalic and atraumatic.  Eyes: Conjunctivae, EOM and lids are normal. Pupils are equal, round, and reactive to light. No scleral icterus.  Neck: Trachea normal and normal range of motion. Neck supple.  Cardiovascular: Normal rate,  regular rhythm and normal heart sounds.  Exam reveals no gallop.   No murmur heard. Pulmonary/Chest: Effort normal and breath sounds normal. No respiratory distress. She has no wheezes. She has no rales.  Abdominal: Soft. Normal appearance. There is no tenderness. There is no rebound, no guarding and no CVA tenderness.  Musculoskeletal: Normal range of motion. She exhibits no edema.  Neurological: She is alert and oriented to person, place, and time. She has normal strength.  Skin: Skin is warm, dry and intact. No rash noted.  Psychiatric: She has a normal mood and affect. Her behavior is normal. Judgment and thought content normal.    ED Course  Procedures (including critical care time)  Results for orders placed during the hospital encounter of 08/21/11  CBC      Component Value Range   WBC 6.2  4.0 - 10.5 K/uL   RBC 4.19  3.87 - 5.11 MIL/uL   Hemoglobin 13.4  12.0 - 15.0 g/dL   HCT 96.0  45.4 - 09.8 %   MCV 91.4  78.0 - 100.0 fL   MCH 32.0  26.0 - 34.0 pg   MCHC 35.0  30.0 - 36.0 g/dL   RDW 11.9  14.7 - 82.9 %   Platelets 308  150 - 400 K/uL  DIFFERENTIAL      Component Value Range   Neutrophils Relative 47  43 - 77 %   Neutro Abs 2.9  1.7 - 7.7 K/uL   Lymphocytes Relative 41  12 - 46 %   Lymphs Abs 2.6  0.7 - 4.0 K/uL   Monocytes Relative 9  3 - 12 %   Monocytes Absolute 0.6  0.1 - 1.0 K/uL   Eosinophils Relative 2  0 - 5 %   Eosinophils Absolute 0.1  0.0 - 0.7 K/uL   Basophils Relative 1  0 - 1 %   Basophils Absolute 0.0  0.0 - 0.1 K/uL  BASIC METABOLIC PANEL      Component Value Range   Sodium 137  135 - 145 mEq/L   Potassium 3.9  3.5 - 5.1 mEq/L   Chloride 101  96 - 112 mEq/L   CO2 23  19 - 32 mEq/L    Glucose, Bld 115 (*) 70 - 99 mg/dL   BUN 11  6 - 23 mg/dL   Creatinine, Ser 5.62  0.50 - 1.10 mg/dL   Calcium 9.9  8.4 - 13.0 mg/dL   GFR calc non Af Amer 89 (*) >90 mL/min   GFR calc Af Amer >90  >90 mL/min  TROPONIN I      Component Value Range   Troponin I <0.30  <0.30 ng/mL  PROTIME-INR      Component Value Range   Prothrombin Time 22.4 (*) 11.6 - 15.2 seconds   INR 1.93 (*) 0.00 - 1.49  DIGOXIN LEVEL      Component Value Range   Digoxin Level 0.6 (*) 0.8 - 2.0 ng/mL       Date: 08/21/2011  Rate: 92  Rhythm: normal sinus rhythm  QRS Axis: normal  Intervals: normal  ST/T Wave abnormalities: normal  Conduction Disutrbances:left bundle branch block  Narrative Interpretation:   Old EKG Reviewed: unchanged from 12-16-10    MDM  Patient presents with symptoms of lightheadedness that appear to be worse with standing.  She had no definitive syncopal events but did noted that her heart rate was elevated.  We will check an EKG to look for signs of atrophic relation.  We'll check a digoxin level to make sure she has not supra-or subtherapeutic.  Also check her electrolytes given that she is on diuretics.  Will check orthostatic vital signs as well.  Give the patient a small amount of normal saline bolus.        Nat Christen, MD 08/21/11 2318  Nat Christen, MD 08/21/11 2345  Patient with normal laboratory studies here.  Her digoxin level is slightly low but not significantly low to warrant additional doses.  Patient does not have significant positive orthostatic vital signs.  She notes no lightheadedness now with ambulation.  Patient was given a small fluid bolus here.  Patient's symptoms are very nonspecific and earlier appeared to be more orthostatic.  Patient is in a normal sinus rhythm as well.  As patient feels well now and has normal labs, normal vital signs and a normal EKG I feel she is safe for discharge home with close followup with her cardiologist Dr.  Riley Kill.  Nat Christen, MD 08/22/11 [phone]

## 2011-08-22 NOTE — ED Notes (Signed)
Patient is alert and orientedx3 , vitals signs are recorded.  Patient has no complaints of pain.  Patient is calling a cab to be transported home.

## 2011-08-22 NOTE — Discharge Instructions (Signed)
Please follow up with Dr. Riley Kill later this week for re-evaluation.    Dizziness Dizziness is a common problem. It is a feeling of unsteadiness or lightheadedness. You may feel like you are about to faint. Dizziness can lead to injury if you stumble or fall. A person of any age group can suffer from dizziness, but dizziness is more common in older adults. CAUSES  Dizziness can be caused by many different things, including:  Middle ear problems.   Standing for too long.   Infections.   An allergic reaction.   Aging.   An emotional response to something, such as the sight of blood.   Side effects of medicines.   Fatigue.   Problems with circulation or blood pressure.   Excess use of alcohol, medicines, or illegal drug use.   Breathing too fast (hyperventilation).   An arrhythmia or problems with your heart rhythm.   Low red blood cell count (anemia).   Pregnancy.   Vomiting, diarrhea, fever, or other illnesses that cause dehydration.   Diseases or conditions such as Parkinson's disease, high blood pressure (hypertension), diabetes, and thyroid problems.   Exposure to extreme heat.  DIAGNOSIS  To find the cause of your dizziness, your caregiver may do a physical exam, lab tests, radiologic imaging scans, or an electrocardiography test (ECG).  TREATMENT  Treatment of dizziness depends on the cause of your symptoms and can vary greatly. HOME CARE INSTRUCTIONS   Drink enough fluids to keep your urine clear or pale yellow. This is especially important in very hot weather. In the elderly, it is also important in cold weather.   If your dizziness is caused by medicines, take them exactly as directed. When taking blood pressure medicines, it is especially important to get up slowly.   Rise slowly from chairs and steady yourself until you feel okay.   In the morning, first sit up on the side of the bed. When this seems okay, stand slowly while holding onto something until  you know your balance is fine.   If you need to stand in one place for a long time, be sure to move your legs often. Tighten and relax the muscles in your legs while standing.   If dizziness continues to be a problem, have someone stay with you for a day or two. Do this until you feel you are well enough to stay alone. Have the person call your caregiver if he or she notices changes in you that are concerning.   Do not drive or use heavy machinery if you feel dizzy.  SEEK IMMEDIATE MEDICAL CARE IF:   Your dizziness or lightheadedness gets worse.   You feel nauseous or vomit.   You develop problems with talking, walking, weakness, or using your arms, hands, or legs.   You are not thinking clearly or you have difficulty forming sentences. It may take a friend or family member to determine if your thinking is normal.   You develop chest pain, abdominal pain, shortness of breath, or sweating.   Your vision changes.   You notice any bleeding.   You have side effects from medicine that seems to be getting worse rather than better.  MAKE SURE YOU:   Understand these instructions.   Will watch your condition.   Will get help right away if you are not doing well or get worse.  Document Released: 08/17/2000 Document Revised: 02/10/2011 Document Reviewed: 09/10/2010 [facility] Patient Information 2012 [place], [place].

## 2011-08-23 ENCOUNTER — Emergency Department (HOSPITAL_COMMUNITY): Payer: Medicare Other

## 2011-08-23 ENCOUNTER — Encounter (HOSPITAL_COMMUNITY): Payer: Self-pay | Admitting: Emergency Medicine

## 2011-08-23 ENCOUNTER — Other Ambulatory Visit: Payer: Self-pay

## 2011-08-23 ENCOUNTER — Telehealth: Payer: Self-pay | Admitting: Internal Medicine

## 2011-08-23 ENCOUNTER — Emergency Department (HOSPITAL_COMMUNITY)
Admission: EM | Admit: 2011-08-23 | Discharge: 2011-08-23 | Disposition: A | Discharge status: Home / Self Care | Payer: Medicare Other | Attending: Emergency Medicine | Admitting: Emergency Medicine

## 2011-08-23 DIAGNOSIS — R42 Dizziness and giddiness: Secondary | ICD-10-CM | POA: Insufficient documentation

## 2011-08-23 DIAGNOSIS — IMO0002 Reserved for concepts with insufficient information to code with codable children: Secondary | ICD-10-CM | POA: Insufficient documentation

## 2011-08-23 DIAGNOSIS — Z7901 Long term (current) use of anticoagulants: Secondary | ICD-10-CM | POA: Insufficient documentation

## 2011-08-23 DIAGNOSIS — M1710 Unilateral primary osteoarthritis, unspecified knee: Secondary | ICD-10-CM | POA: Insufficient documentation

## 2011-08-23 DIAGNOSIS — Z794 Long term (current) use of insulin: Secondary | ICD-10-CM | POA: Insufficient documentation

## 2011-08-23 DIAGNOSIS — R221 Localized swelling, mass and lump, neck: Secondary | ICD-10-CM | POA: Insufficient documentation

## 2011-08-23 DIAGNOSIS — I2510 Atherosclerotic heart disease of native coronary artery without angina pectoris: Secondary | ICD-10-CM | POA: Insufficient documentation

## 2011-08-23 DIAGNOSIS — S0990XA Unspecified injury of head, initial encounter: Secondary | ICD-10-CM | POA: Insufficient documentation

## 2011-08-23 DIAGNOSIS — W010XXA Fall on same level from slipping, tripping and stumbling without subsequent striking against object, initial encounter: Secondary | ICD-10-CM | POA: Insufficient documentation

## 2011-08-23 DIAGNOSIS — I10 Essential (primary) hypertension: Secondary | ICD-10-CM | POA: Insufficient documentation

## 2011-08-23 DIAGNOSIS — N390 Urinary tract infection, site not specified: Secondary | ICD-10-CM | POA: Insufficient documentation

## 2011-08-23 DIAGNOSIS — R220 Localized swelling, mass and lump, head: Secondary | ICD-10-CM | POA: Insufficient documentation

## 2011-08-23 DIAGNOSIS — E119 Type 2 diabetes mellitus without complications: Secondary | ICD-10-CM | POA: Insufficient documentation

## 2011-08-23 HISTORY — DX: Cerebral infarction, unspecified: I63.9

## 2011-08-23 LAB — URINALYSIS, ROUTINE W REFLEX MICROSCOPIC
Bilirubin Urine: NEGATIVE
Ketones, ur: NEGATIVE mg/dL
Nitrite: POSITIVE — AB
Protein, ur: NEGATIVE mg/dL
Specific Gravity, Urine: 1.011 (ref 1.005–1.030)
Urobilinogen, UA: 0.2 mg/dL (ref 0.0–1.0)

## 2011-08-23 LAB — POCT I-STAT TROPONIN I: Troponin i, poc: 0 ng/mL (ref 0.00–0.08)

## 2011-08-23 LAB — DIFFERENTIAL
Basophils Relative: 1 % (ref 0–1)
Eosinophils Absolute: 0.1 10*3/uL (ref 0.0–0.7)
Eosinophils Relative: 2 % (ref 0–5)
Monocytes Relative: 8 % (ref 3–12)
Neutrophils Relative %: 50 % (ref 43–77)

## 2011-08-23 LAB — URINE MICROSCOPIC-ADD ON

## 2011-08-23 LAB — PROTIME-INR: INR: 1.78 — ABNORMAL HIGH (ref 0.00–1.49)

## 2011-08-23 LAB — GLUCOSE, CAPILLARY: Glucose-Capillary: 136 mg/dL — ABNORMAL HIGH (ref 70–99)

## 2011-08-23 LAB — BASIC METABOLIC PANEL
BUN: 12 mg/dL (ref 6–23)
Calcium: 9.7 mg/dL (ref 8.4–10.5)
GFR calc Af Amer: >90 mL/min (ref >90)
GFR calc non Af Amer: 88 mL/min — ABNORMAL LOW (ref >90)
Potassium: 3.5 mEq/L (ref 3.5–5.1)

## 2011-08-23 LAB — CBC
MCH: 31.7 pg (ref 26.0–34.0)
MCHC: 34.2 g/dL (ref 30.0–36.0)
MCV: 92.4 fL (ref 78.0–100.0)
Platelets: 306 10*3/uL (ref 150–400)

## 2011-08-23 MED ORDER — NITROFURANTOIN MONOHYD MACRO 100 MG PO CAPS: 100.0000 mg | ORAL_CAPSULE | Freq: Two times a day (BID) | ORAL | Status: AC

## 2011-08-23 NOTE — Telephone Encounter (Signed)
Per [name]- go to emergency room due to coumadin regimen- pt informed

## 2011-08-23 NOTE — ED Notes (Signed)
Discharged via teach back 

## 2011-08-23 NOTE — ED Notes (Addendum)
Pt c/o fall this am hitting right side of face; pt on coumadin and sts here to be checked; pt sts right knee pain also; pt alert at present and denies LOC; pt sts diarrhea since being given RX for clindamycin

## 2011-08-23 NOTE — ED Provider Notes (Signed)
History     CSN: [id number]  Arrival date & time 08/23/11  1419   First MD Initiated Contact with Patient 08/23/11 1628      Chief Complaint  Patient presents with  . Fall    (Consider location/radiation/quality/duration/timing/severity/associated sxs/prior treatment) HPI Pt is on coumadin for atrial fib and had a trip and fall striking R side of face on bedside table at noon today. No LOC. Has had swelling over R maxilla. Last INR roughly 2 several days ago. Denies neck pain.  Past Medical History  Diagnosis Date  . CAD (coronary artery disease)   . PAF (paroxysmal atrial fibrillation)   . Hypertension   . Hyperlipidemia   . GERD (gastroesophageal reflux disease)   . Hypokalemia   . Diabetes mellitus   . Depression   . Lumbar back pain   . LBBB (left bundle branch block)   . Headache   . Lymphadenitis   . Arthritis   . Bursitis   . Chronic anticoagulation   . Heart palpitations   . Diastolic dysfunction     per echo in October 2012 with EF 50 to 55%  . 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 Angioplasty laminectomy  . Lumbar laminectomy   . Coronary artery bypass graft     LIMA to LAD   . Abdominal hysterectomy   . Cholecystectomy   . Tonsillectomy     Family History  Problem Relation Age of Onset  . Heart attack Father   . Heart disease Father   . Kidney disease    . Stroke    . Arthritis    . Hypertension    . Diabetes    . Stroke Mother     History  Substance Use Topics  . Smoking status: Former Smoker    Quit date: 03/07/2001  . Smokeless tobacco: Not on file  . Alcohol Use: No    OB History    Grav Para Term Preterm Abortions TAB SAB Ect Mult Living                  Review of Systems  Constitutional: Negative for fever and chills.  HENT: Positive for facial swelling. Negative for congestion, sore throat and neck pain.   Eyes: Negative for pain and visual disturbance.  Respiratory:  Negative for cough and shortness of breath.   Cardiovascular: Negative for chest pain, palpitations and leg swelling.  Gastrointestinal: Negative for nausea, vomiting and abdominal pain.  Genitourinary: Negative for dysuria.  Musculoskeletal: Negative for back pain.  Skin: Negative for rash and wound.  Neurological: Positive for light-headedness. Negative for dizziness, syncope, weakness and numbness.    Allergies  Cleocin; Codeine; Doxycycline hyclate; Morphine; Pentazocine lactate; Vibramycin; and Sulfonamide derivatives  Home Medications   Current Outpatient Rx  Name Route Sig Dispense Refill  . ALPRAZOLAM 1 MG PO TABS Oral Take 1 mg by mouth 3 (three) times daily.    . ASPIRIN 81 MG PO TABS Oral Take 81 mg by mouth daily.      . ATORVASTATIN CALCIUM 20 MG PO TABS Oral Take 40 mg by mouth at bedtime.     . CYCLOBENZAPRINE HCL 10 MG PO TABS Oral Take 10 mg by mouth 2 (two) times daily as needed. For muscle spasms    . DIGOXIN 0.25 MG PO TABS Oral Take 250 mcg by mouth daily.    Marland Kitchen EXENATIDE 2 MG [place] SUSR  Subcutaneous Inject 2 mg into the skin once a week. On mondays    . GABAPENTIN 300 MG PO CAPS Oral Take 300 mg by mouth 3 (three) times daily.    Marland Kitchen HYDROCHLOROTHIAZIDE 12.5 MG PO CAPS Oral Take 12.5 mg by mouth daily.    Marland Kitchen HYDROCODONE-ACETAMINOPHEN 10-325 MG PO TABS Oral Take 1 tablet by mouth every 4 (four) hours as needed. For pain    . INSULIN ASPART 100 UNIT/ML [place] SOLN Subcutaneous Inject 65-75 Units into the skin 3 (three) times daily before meals. Take 65 units in the am, 70 units around lunch & 75 units at night    . LANSOPRAZOLE 30 MG PO CPDR Oral Take 30 mg by mouth daily as needed. For heartburn    . METOPROLOL SUCCINATE ER 100 MG PO TB24  TAKE 1 TABLET BY MOUTH TWICE A DAY 60 tablet 6  . NITROGLYCERIN 0.4 MG SL SUBL Sublingual Place 0.4 mg under the tongue every 5 (five) minutes as needed. For chest pain    . POTASSIUM CHLORIDE CRYS ER 20 MEQ PO TBCR Oral Take 30 mEq by mouth  daily. Take 1.5 tabs daily     . SPIRONOLACTONE 25 MG PO TABS Oral Take 25 mg by mouth 2 (two) times daily.    . TELMISARTAN 40 MG PO TABS Oral Take 40 mg by mouth daily.      . WARFARIN SODIUM 6 MG PO TABS Oral Take 3-6 mg by mouth daily. 6mg  on mondays 3 mg all other days    . NITROFURANTOIN MONOHYD MACRO 100 MG PO CAPS Oral Take 1 capsule (100 mg total) by mouth 2 (two) times daily. 10 capsule 0    BP 129/57  Pulse 80  Temp 98.7 F (37.1 C) (Oral)  Resp 16  SpO2 95%  Physical Exam  Nursing note and vitals reviewed. Constitutional: She is oriented to person, place, and time. She appears well-developed and well-nourished. No distress.  HENT:  Head: Normocephalic and atraumatic.  Mouth/Throat: Oropharynx is clear and moist.       R zygomatic area swelling. No obvious fractures. Very mild ttp over site.   Eyes: EOM are normal. Pupils are equal, round, and reactive to light.  Neck: Normal range of motion. Neck supple.       No mid-line post cervical ttp  Cardiovascular: Normal rate and regular rhythm.   Pulmonary/Chest: Effort normal and breath sounds normal. No respiratory distress. She has no wheezes. She has no rales.  Abdominal: Soft. Bowel sounds are normal. There is no tenderness. There is no rebound and no guarding.  Musculoskeletal: Normal range of motion. She exhibits no edema and no tenderness.       Mild L knee tenderness to palpation over patella. No swelling or deformity. FROM, no laxity  Neurological: She is alert and oriented to person, place, and time.       5/5 motor, sensation intact  Skin: Skin is warm and dry. No rash noted. No erythema.  Psychiatric: She has a normal mood and affect. Her behavior is normal.    ED Course  Procedures (including critical care time)  Labs Reviewed  GLUCOSE, CAPILLARY - Abnormal; Notable for the following:    Glucose-Capillary 136 (*)     All other components within normal limits  BASIC METABOLIC PANEL - Abnormal; Notable for  the following:    Glucose, Bld 120 (*)     GFR calc non Af Amer 88 (*)     All other components  within normal limits  URINALYSIS, ROUTINE W REFLEX MICROSCOPIC - Abnormal; Notable for the following:    Nitrite POSITIVE (*)     All other components within normal limits  PROTIME-INR - Abnormal; Notable for the following:    Prothrombin Time 21.0 (*)     INR 1.78 (*)     All other components within normal limits  DIGOXIN LEVEL - Abnormal; Notable for the following:    Digoxin Level 0.7 (*)     All other components within normal limits  URINE MICROSCOPIC-ADD ON - Abnormal; Notable for the following:    Bacteria, UA MANY (*)     All other components within normal limits  CBC  DIFFERENTIAL  APTT  POCT I-STAT TROPONIN I   Ct Head Wo Contrast  08/23/2011  *RADIOLOGY REPORT*  Clinical Data: Fall  CT HEAD WITHOUT CONTRAST,CT MAXILLOFACIAL WITHOUT CONTRAST  Technique:  Contiguous axial images were obtained from the base of the skull through the vertex without contrast.,Technique: Multidetector CT imaging of the maxillofacial structures was performed. Multiplanar CT image reconstructions were also generated.  Comparison: 08/24/2006  Findings: No skull fracture is noted.  Paranasal sinuses and mastoid air cells are unremarkable.  No intracranial hemorrhage, mass effect or midline shift.  No acute infarction.  No mass lesion is noted on this unenhanced scan. Mild cerebral atrophy.  IMPRESSION: No acute intracranial abnormality.  Mild cerebral atrophy.  CT maxillofacial without IV contrast:  Axial images shows no facial fractures.  Metallic dental artifact are noted.  No nasal bone fracture is noted.  Bilateral eye globe is symmetrical in appearance.  No intra orbital hematoma.  No zygomatic fracture is noted.  No facial fluid collection is noted.  Coronal reconstructed images shows minimal mucosal thickening floor of the right maxillary sinus.  No orbital rim or orbital floor fracture.  No mandibular  fracture is noted.  No TMJ dislocation.  Sagittal images shows patent oral pharyngeal and nasopharyngeal airway.  The visualized upper cervical spine shows degenerative changes at C3 C4 and C4-C5 level.  Impression: 1.  No facial fractures are noted. 2.  No orbital hematoma. 3.  No nasal bone fracture.  No facial fluid collection.  Patent oral pharyngeal and nasopharyngeal airway.  Original Report Authenticated By: Natasha Mead, M.D.   Dg Knee Complete 4 Views Right  08/23/2011  *RADIOLOGY REPORT*  Clinical Data: Fall  RIGHT KNEE - COMPLETE 4+ VIEW  Comparison: 11/11/2003  Findings: Moderate degenerative change with joint space narrowing and spurring in all  three compartments.  This has progressed since the prior study.  Negative for fracture or effusion.  IMPRESSION: Moderate osteoarthritis, with progression.  No acute abnormality.  Original Report Authenticated By: Camelia Phenes, M.D.   Ct Maxillofacial Wo Cm  08/23/2011  *RADIOLOGY REPORT*  Clinical Data: Fall  CT HEAD WITHOUT CONTRAST,CT MAXILLOFACIAL WITHOUT CONTRAST  Technique:  Contiguous axial images were obtained from the base of the skull through the vertex without contrast.,Technique: Multidetector CT imaging of the maxillofacial structures was performed. Multiplanar CT image reconstructions were also generated.  Comparison: 08/24/2006  Findings: No skull fracture is noted.  Paranasal sinuses and mastoid air cells are unremarkable.  No intracranial hemorrhage, mass effect or midline shift.  No acute infarction.  No mass lesion is noted on this unenhanced scan. Mild cerebral atrophy.  IMPRESSION: No acute intracranial abnormality.  Mild cerebral atrophy.  CT maxillofacial without IV contrast:  Axial images shows no facial fractures.  Metallic dental artifact are noted.  No  nasal bone fracture is noted.  Bilateral eye globe is symmetrical in appearance.  No intra orbital hematoma.  No zygomatic fracture is noted.  No facial fluid collection is noted.   Coronal reconstructed images shows minimal mucosal thickening floor of the right maxillary sinus.  No orbital rim or orbital floor fracture.  No mandibular fracture is noted.  No TMJ dislocation.  Sagittal images shows patent oral pharyngeal and nasopharyngeal airway.  The visualized upper cervical spine shows degenerative changes at C3 C4 and C4-C5 level.  Impression: 1.  No facial fractures are noted. 2.  No orbital hematoma. 3.  No nasal bone fracture.  No facial fluid collection.  Patent oral pharyngeal and nasopharyngeal airway.  Original Report Authenticated By: Natasha Mead, M.D.     1. Closed head injury   2. UTI (lower urinary tract infection)      Date: 08/23/2011  Rate: 80  Rhythm: normal sinus rhythm  QRS Axis: normal  Intervals: QRS prolonged  ST/T Wave abnormalities: nonspecific ST changes  Conduction Disutrbances:left bundle branch block  Narrative Interpretation:   Old EKG Reviewed: unchanged    MDM   Pt ambulating without difficulty. F/u with PMD. Return for concerns         Loren Racer, MD 08/23/11 Ernestina [place]

## 2011-08-23 NOTE — Telephone Encounter (Signed)
aller: Kiora/Mother; PCP: Darryll Capers; CB#: [phone]; ; ; Call regarding Injury/Trauma;  Pt fell and hit her cheekbone on the bedside table in her bedroom. Pt is on Coumadin. She has a bruise the size of a baseball on her cheek. She is alert /has slow speech from a previous TIA. 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  Rn triaged per face injury and head injury. ED disposition because of anitcoagulant therapy. Office note sent in EPIC to Dr. Lovell Sheehan and staff to determine if pt needs an appt.

## 2011-08-23 NOTE — Discharge Instructions (Signed)
Head Injury, Adult You have had a head injury that does not appear serious at this time. A concussion is a state of changed mental ability, usually from a blow to the head. You should take clear liquids for the rest of the day and then resume your regular diet. You should not take sedatives or alcoholic beverages for as long as directed by your caregiver after discharge. After injuries such as yours, most problems occur within the first 24 hours. SYMPTOMS These minor symptoms may be experienced after discharge:  Memory difficulties.   Dizziness.   Headaches.   Double vision.   Hearing difficulties.   Depression.   Tiredness.   Weakness.   Difficulty with concentration.  If you experience any of these problems, you should not be alarmed. A concussion requires a few days for recovery. Many patients with head injuries frequently experience such symptoms. Usually, these problems disappear without medical care. If symptoms last for more than one day, notify your caregiver. See your caregiver sooner if symptoms are becoming worse rather than better. HOME CARE INSTRUCTIONS   During the next 24 hours you must stay with someone who can watch you for the warning signs listed below.  Although it is unlikely that serious side effects will occur, you should be aware of signs and symptoms which may necessitate your return to this location. Side effects may occur up to 7 - 10 days following the injury. It is important for you to carefully monitor your condition and contact your caregiver or seek immediate medical attention if there is a change in your condition. SEEK IMMEDIATE MEDICAL CARE IF:   There is confusion or drowsiness.   You can not awaken the injured person.   There is nausea (feeling sick to your stomach) or continued, forceful vomiting.   You notice dizziness or unsteadiness which is getting worse, or inability to walk.   You have convulsions or unconsciousness.   You experience  severe, persistent headaches not relieved by over-the-counter or prescription medicines for pain. (Do not take aspirin as this impairs clotting abilities). Take other pain medications only as directed.   You can not use arms or legs normally.   There is clear or bloody discharge from the nose or ears.  MAKE SURE YOU:   Understand these instructions.   Will watch your condition.   Will get help right away if you are not doing well or get worse.  Document Released: 02/21/2005 Document Revised: 02/10/2011 Document Reviewed: 01/09/2009 [facility] Patient Information 2012 [place], [place].  Urinary Tract Infection Infections of the urinary tract can start in several places. A bladder infection (cystitis), a kidney infection (pyelonephritis), and a prostate infection (prostatitis) are different types of urinary tract infections (UTIs). They usually get better if treated with medicines (antibiotics) that kill germs. Take all the medicine until it is gone. You or your child may feel better in a few days, but TAKE ALL MEDICINE or the infection may not respond and may become more difficult to treat. HOME CARE INSTRUCTIONS   Drink enough water and fluids to keep the urine clear or pale yellow. Cranberry juice is especially recommended, in addition to large amounts of water.   Avoid caffeine, tea, and carbonated beverages. They tend to irritate the bladder.   Alcohol may irritate the prostate.   Only take over-the-counter or prescription medicines for pain, discomfort, or fever as directed by your caregiver.  To prevent further infections:  Empty the bladder often. Avoid holding urine for long  periods of time.   After a bowel movement, women should cleanse from front to back. Use each tissue only once.   Empty the bladder before and after sexual intercourse.  FINDING OUT THE RESULTS OF YOUR TEST Not all test results are available during your visit. If your or your child's test results are not back  during the visit, make an appointment with your caregiver to find out the results. Do not assume everything is normal if you have not heard from your caregiver or the medical facility. It is important for you to follow up on all test results. SEEK MEDICAL CARE IF:   There is back pain.   Your baby is older than 3 months with a rectal temperature of 100.5 F (38.1 C) or higher for more than 1 day.   Your or your child's problems (symptoms) are no better in 3 days. Return sooner if you or your child is getting worse.  SEEK IMMEDIATE MEDICAL CARE IF:   There is severe back pain or lower abdominal pain.   You or your child develops chills.   You have a fever.   Your baby is older than 3 months with a rectal temperature of 102 F (38.9 C) or higher.   Your baby is 18 months old or younger with a rectal temperature of 100.4 F (38 C) or higher.   There is nausea or vomiting.   There is continued burning or discomfort with urination.  MAKE SURE YOU:   Understand these instructions.   Will watch your condition.   Will get help right away if you are not doing well or get worse.  Document Released: 12/01/2004 Document Revised: 02/10/2011 Document Reviewed: 07/06/2006 [facility] Patient Information 2012 [place], [place].  RESOURCE GUIDE  Chronic Pain Problems: Contact [facility]  [phone] Patients need to be referred by their primary care doctor.  Insufficient Money for Medicine: Contact United Way:  call "211" or [facility] [phone].  No Primary Care Doctor: - Call [facility] Connect  [phone] - can help you locate a primary care doctor that  accepts your insurance, provides certain services, etc. - Physician Referral [phone]  Agencies that provide inexpensive medical care: - [facility]  [phone] - [facility]  [phone] - [facility]  [phone] [facility]  [phone] - [facility]  [phone] [facility]  [phone]  Medicaid-accepting [facility] Providers: - [facility]- [address]  [phone], Mon-Fri 9am-7pm, Sat 9am-1pm - [facility]- [address] [place], [address] [place]  [phone] - [facility]- [address] [place]  [phone] [facility]- [address]  [phone] - Renaye Rakers- [address] [place], [address], [phone]  Only accepts [place] patients after they have their name  applied to their card  Self Pay (no insurance) in [place] Center: - Sickle Cell Patients: Dr Willey Blade, [facility]  [address] [place], [phone] - [facility]- [address] [place]  [phone]       [facility] [place]- [address]       -     [facility]- see information above (Speak to [employer] if you do not have insurance)       -  [facility]- [address] [place], [phone]       -  [facility]- [address],  [phone]       -  [facility]- [address] [phone]       -  Dr Julio Sicks-  [address], [address], [place], [phone]       -  [facility]- [address] [phone]       -  [facility]- [address], [phone], also [address], [phone]       -    [facility]- [address] [place], [phone], 1st & 3rd Saturday   every month, 10am-1pm  1) Find a Doctor and Pay Out of Pocket Although you won't have to find out who is covered by your insurance plan, it is a good idea to ask around and get recommendations. You will then need to call the office and see if the doctor you have chosen will accept you as a new patient and what types of options they offer for patients who are self-pay. Some doctors offer discounts or will set up payment plans for their patients who do not have  insurance, but you will need to ask so you aren't surprised when you get to your appointment.  2) Contact Your Local Health Department Not all health departments have doctors that can see patients for sick visits, but many do, so it is worth a call to see if yours does. If you don't know where your local health department is, you can check in your phone book. The CDC also has a tool to help you locate your state's health department, and many state websites also have listings of all of their local health departments.  3) Find a Walk-in Clinic If your illness is not likely to be very severe or complicated, you may want to try a walk in clinic. These are popping up all over the country in pharmacies, drugstores, and shopping centers. They're usually staffed by nurse practitioners or physician assistants that have been trained to treat common illnesses and complaints. They're usually fairly quick and inexpensive. However, if you have serious medical issues or chronic medical problems, these are probably not your best option  STD Testing - [facility] Department of [facility], STD Clinic, [address], [place], phone [phone] or [phone].  Monday - Friday, call for an appointment. [facility] Department of [employer], STD Clinic, [address], [place], phone [phone] or [phone].  Monday - Friday, call for an appointment.  Abuse/Neglect: [facility] Child Abuse Hotline 970-866-3359 [facility] Child Abuse Hotline 314-778-4500 (After Hours)  Emergency Shelter:  [facility] 360-077-6440  Maternity Homes: - [facility] 239-855-7926 - [facility] [phone]  MRSA Hotline #:   (530)531-7982  [facility] Resources  [facility] [place]  United [employer] [facility] Dept. [address]                 [address]         [address] [place] [address]   [address]  Cristobal Goldmann Phone:  [phone]                                  Phone:  [phone]                   Phone:  [phone]  [facility], [phone] - [phone]       -     [facility] in [place], [address]                                  [phone], [facility] Child Abuse Hotline 304 543 8158 or (930)223-8018 (After Hours)   Behavioral Health Services  Substance Abuse Resources: - Alcohol and Drug Services  (212) [phone] - [facility] [phone] - [facility] [place] [phone] Floydene Flock ([phone] - Residential & Outpatient Substance Abuse Program  [phone]  Psychological Services: [facility]  [phone] [facility] Services  [phone] - [facility], [phone] [place]. [address], [place], ACCESS LINE: 404-628-6115 or [phone], [employer]za  Dental Assistance  If unable to pay or uninsured, contact:  [facility] or [facility]. to become qualified for the adult dental clinic.  Patients with Medicaid: [facility] [phone] [address], [phone] [address] [place] [address], [phone]  If unable to pay, or uninsured, contact [facility] [phone]) or [facility] Department [phone] in [place], [phone] in [facility]) to become qualified for the adult dental clinic  Other Low-Cost Community Dental Services: - [facility]- [address] [place], [place], [place], [address], [phone], [phone], 2nd and 4th Thursday of the month at 6:30am.  10 clients each day by appointment, can sometimes see walk-in patients if someone does not show for an appointment. [facility]- [address] [place], [place], [address],  [phone] - [facility]- [address] [place], [place], [address], [phone] - [place] [facility]- [phone] [facility] Department- [phone] [place] Department- [phone]

## 2011-08-25 ENCOUNTER — Other Ambulatory Visit: Payer: Self-pay | Admitting: *Deleted

## 2011-08-25 MED ORDER — ALPRAZOLAM 1 MG PO TABS: 1.0000 mg | ORAL_TABLET | Freq: Three times a day (TID) | ORAL | Status: DC

## 2011-08-29 ENCOUNTER — Ambulatory Visit (INDEPENDENT_AMBULATORY_CARE_PROVIDER_SITE_OTHER): Payer: Medicare Other | Admitting: Internal Medicine

## 2011-08-29 ENCOUNTER — Encounter: Payer: Self-pay | Admitting: Internal Medicine

## 2011-08-29 ENCOUNTER — Telehealth: Payer: Self-pay | Admitting: Family Medicine

## 2011-08-29 VITALS — BP 110/78 | Temp 97.7°F | Wt 236.0 lb

## 2011-08-29 DIAGNOSIS — R197 Diarrhea, unspecified: Secondary | ICD-10-CM

## 2011-08-29 DIAGNOSIS — I10 Essential (primary) hypertension: Secondary | ICD-10-CM

## 2011-08-29 DIAGNOSIS — K521 Toxic gastroenteritis and colitis: Secondary | ICD-10-CM

## 2011-08-29 DIAGNOSIS — E1059 Type 1 diabetes mellitus with other circulatory complications: Secondary | ICD-10-CM

## 2011-08-29 DIAGNOSIS — Z7901 Long term (current) use of anticoagulants: Secondary | ICD-10-CM

## 2011-08-29 DIAGNOSIS — T50904A Poisoning by unspecified drugs, medicaments and biological substances, undetermined, initial encounter: Secondary | ICD-10-CM

## 2011-08-29 DIAGNOSIS — R350 Frequency of micturition: Secondary | ICD-10-CM

## 2011-08-29 DIAGNOSIS — I798 Other disorders of arteries, arterioles and capillaries in diseases classified elsewhere: Secondary | ICD-10-CM

## 2011-08-29 NOTE — Progress Notes (Signed)
  Subjective:    Patient ID: Kristen [name], female    DOB: 04-Nov-1945, 66 y.o.   MRN: [id number]  HPI  66 year old patient who is seen today for followup. She had been on clindamycin do to a dental infection which was associated with profuse diarrhea. Clindamycin has been discontinued but she did have diarrhea until earlier this morning. No diarrhea through the day. Or the weekend she began taking Kaopectate. At the present time she feels well. She is on chronic Coumadin anticoagulation and was seen in the ED last week following head trauma. At that time she was placed on Macrobid for a UTI. She has one final dose. She has diabetes controlled with GLP agonist as well as high-dose mealtime insulin. She uses NovoLog 65 units prior to breakfast 7o units prior to lunch and 75 units prior to her evening meal;  she has had a number of blood sugars in the 70-80 mg percent range. She has treated hypertension which has been stable.   Review of Systems  Constitutional: Negative.   HENT: Negative for hearing loss, congestion, sore throat, rhinorrhea, dental problem, sinus pressure and tinnitus.   Eyes: Negative for pain, discharge and visual disturbance.  Respiratory: Negative for cough and shortness of breath.   Cardiovascular: Negative for chest pain, palpitations and leg swelling.  Gastrointestinal: Positive for diarrhea. Negative for nausea, vomiting, abdominal pain, constipation, blood in stool and abdominal distention.  Genitourinary: Negative for dysuria, urgency, frequency, hematuria, flank pain, vaginal bleeding, vaginal discharge, difficulty urinating, vaginal pain and pelvic pain.  Musculoskeletal: Negative for joint swelling, arthralgias and gait problem.  Skin: Negative for rash.  Neurological: Negative for dizziness, syncope, speech difficulty, weakness, numbness and headaches.  Hematological: Negative for adenopathy.  Psychiatric/Behavioral: Negative for behavioral problems, dysphoric  mood and agitation. The patient is not nervous/anxious.        Objective:   Physical Exam  Constitutional: She is oriented to person, place, and time. She appears well-developed and well-nourished.       Obese. No distress. The blood pressure 130/78  HENT:  Head: Normocephalic.  Right Ear: External ear normal.  Left Ear: External ear normal.  Mouth/Throat: Oropharynx is clear and moist.  Eyes: Conjunctivae and EOM are normal. Pupils are equal, round, and reactive to light.  Neck: Normal range of motion. Neck supple. No thyromegaly present.  Cardiovascular: Normal rate, regular rhythm, normal heart sounds and intact distal pulses.   Pulmonary/Chest: Effort normal and breath sounds normal.  Abdominal: Soft. Bowel sounds are normal. She exhibits no distension and no mass. There is no tenderness. There is no rebound and no guarding.       Benign exam  Musculoskeletal: Normal range of motion.  Lymphadenopathy:    She has no cervical adenopathy.  Neurological: She is alert and oriented to person, place, and time.  Skin: Skin is warm and dry. No rash noted.  Psychiatric: She has a normal mood and affect. Her behavior is normal.          Assessment & Plan:  Antibiotic associated diarrhea. We'll treat with 2 weeks of Align Diabetes mellitus. We'll decrease mealtime insulin until appetite and general status has stabilized Hypertension stable Chronic Coumadin anticoagulation secondary to atrial fibrillation

## 2011-08-29 NOTE — Telephone Encounter (Signed)
[facility] Triage Call Report Triage Record Num: [id number] Operator: Chevis Pretty Patient Name: Kristen [name] Call Date & Time: 08/28/2011 2:04:38PM Patient Phone: [phone] PCP: Kristen [name] Patient Gender: Female PCP Fax : [phone] Patient DOB: 16-Apr-1945 Practice Name: [name] Reason for Call: Caller: Kristen [name]/Patient; PCP: Kristen [name]; CB#: [phone]; Call regarding Diarrhea; onset 08/19/11 and it has not improved. Took clindamycin after being in ED for dental abscess and had IV clinda, and po dosing. Diarrhea started soon after this, and she was dehydrated. She was to monitor her heart rate and come back if HR > 100. Seen in ED again after fall 08/23/11 and knee injury. She also found she had bladder infection at that time and started microbid. Diarrhea has continued, with watery stools 1-4 times per day. States she called the pharmacist 08/27/11 and was told to stop her antibiotics. Has appt with Dr. Riley [name] 08/31/11. Has had lightheadedness, but not currently. Afebrile. Per protocol, emergent symptoms denied; advised appt within 24 hours. To call office in AM for appt with Dr. Lovell [name]. Protocol(s) Used: Diabetes: Gastrointestinal Problems Recommended Outcome per Protocol: See Provider within 24 hours Reason for Outcome: Vomiting more than 1 time or diarrhea for more than 6 hours Care Advice: Follow the usual meal plan if possible. Drink extra non-caloric fluids. If unable to eat at all, drink regular soft drinks and juices so that 50 grams of carbohydrates are taken in every 3 to 4 hours. ~ ~ Test your blood sugar before driving. Do not drive if blood sugar 70 mg/dl or less. ~ SYMPTOM / CONDITION MANAGEMENT ~ CAUTIONS ~ List, or take, all current prescription(s), nonprescription or alternative medication(s) to provider for evaluation. Call provider within 24 hours if blood sugar is 250 mg/dl two times in a row, or if most blood sugars are  250 mg/dl for more than 3 days. Call provider immediately if urine ketones are large (3+ to 4+). ~ Diarrheal Care: - Drink 2-3 quarts (2-3 liters) per day of low sugar content fluids, including over the counter oral hydration solution, unless directed otherwise by provider. - If accompanied by vomiting, take the fluids in frequent small sips or suck on ice chips. - Eat easily digested foods (such as bananas, rice, applesauce, toast, cooked cereals, soup, crackers, baked or boiled potato, or baked chicken or [place] without skin). - Do not eat high fiber, high fat, high sugar content foods, or highly seasoned foods. - Do not drink caffeinated or alcoholic beverages. - Avoid milk and milk products while having symptoms. As symptoms improve, gradually add back to diet. - Application of A&D ointment or witch hazel medicated pads may help anal irritation. - Antidiarrheal medications are usually unnecessary. If symptoms are severe, consider nonprescription antidiarrheal and anti-motility drugs as directed by label or a provider. Do not take if have high fever or bloody diarrhea. If pregnant, do not take any medications not approved by your provider. - Consult your provider for advice regarding continuing prescription medication. ~ Low Blood Sugar Treatment: - Always have some form of glucose available for use. This may include glucose tablets or hard candies. - When blood sugar is known to be low, take 4 oz. (0.1 liter) of juice, 3 glucose tablets, or 5-6 hard candies. - If unavailable, take 10 to 15 grams of any carbohydrate: ~

## 2011-08-29 NOTE — Patient Instructions (Signed)
ALIGN-  one daily Okay to use Kaopectate if needed  Decrease mealtime insulin until appetite and food intake has normalized  Call or return to clinic prn if these symptoms worsen or fail to improve as anticipated.

## 2011-08-31 ENCOUNTER — Ambulatory Visit (INDEPENDENT_AMBULATORY_CARE_PROVIDER_SITE_OTHER): Payer: Medicare Other | Admitting: Cardiology

## 2011-08-31 ENCOUNTER — Encounter: Payer: Self-pay | Admitting: Cardiology

## 2011-08-31 ENCOUNTER — Ambulatory Visit (INDEPENDENT_AMBULATORY_CARE_PROVIDER_SITE_OTHER): Payer: Medicare Other | Admitting: *Deleted

## 2011-08-31 ENCOUNTER — Encounter (HOSPITAL_COMMUNITY): Payer: Self-pay | Admitting: Cardiology

## 2011-08-31 VITALS — BP 138/88 | HR 74 | Ht 62.0 in | Wt 235.0 lb

## 2011-08-31 DIAGNOSIS — Z7901 Long term (current) use of anticoagulants: Secondary | ICD-10-CM

## 2011-08-31 DIAGNOSIS — I4891 Unspecified atrial fibrillation: Secondary | ICD-10-CM

## 2011-08-31 DIAGNOSIS — R0789 Other chest pain: Secondary | ICD-10-CM

## 2011-08-31 LAB — POCT INR: INR: 1.9

## 2011-08-31 NOTE — Patient Instructions (Signed)
Your physician recommends that you schedule a follow-up appointment in: 3 MONTHS  Your physician recommends that you continue on your current medications as directed. Please refer to the Current Medication list given to you today.   

## 2011-08-31 NOTE — Progress Notes (Signed)
HPI:  The patient continues to have some pain with elevation of her arm. She is not very active because of her knees. Episodes of getting suddenly hot have not gone away. We queried her carefully about chest pain and she's had nothing really very typical sounding.  She was in the emergency room recently because of a trip and fall and she had some trauma to her face she was evaluated thoroughly.  See ED notes.  Current Outpatient Prescriptions  Medication Sig Dispense Refill  . ALPRAZolam (XANAX) 1 MG tablet Take 1 tablet (1 mg total) by mouth 3 (three) times daily.  30 tablet  5  . aspirin 81 MG tablet Take 81 mg by mouth daily.        Marland Kitchen atorvastatin (LIPITOR) 20 MG tablet Take 40 mg by mouth at bedtime.       . cyclobenzaprine (FLEXERIL) 10 MG tablet Take 10 mg by mouth 2 (two) times daily as needed. For muscle spasms      . digoxin (LANOXIN) 0.25 MG tablet Take 250 mcg by mouth daily.      . Exenatide (BYDUREON) 2 MG SUSR Inject 2 mg into the skin once a week. On mondays      . gabapentin (NEURONTIN) 300 MG capsule Take 300 mg by mouth 3 (three) times daily.      . hydrochlorothiazide (MICROZIDE) 12.5 MG capsule Take 12.5 mg by mouth daily.      Marland Kitchen HYDROcodone-acetaminophen (NORCO) 10-325 MG per tablet Take 1 tablet by mouth every 4 (four) hours as needed. For pain      . insulin aspart (NOVOLOG) 100 UNIT/ML injection Inject 65-75 Units into the skin 3 (three) times daily before meals. Take 65 units in the am, 70 units around lunch & 75 units at night      . lansoprazole (PREVACID) 30 MG capsule Take 30 mg by mouth daily as needed. For heartburn      . metoprolol succinate (TOPROL-XL) 100 MG 24 hr tablet TAKE 1 TABLET BY MOUTH TWICE A DAY  60 tablet  6  . nitrofurantoin, macrocrystal-monohydrate, (MACROBID) 100 MG capsule Take 1 capsule (100 mg total) by mouth 2 (two) times daily.  10 capsule  0  . nitroGLYCERIN (NITROSTAT) 0.4 MG SL tablet Place 0.4 mg under the tongue every 5 (five) minutes  as needed. For chest pain      . potassium chloride SA (K-DUR,KLOR-CON) 20 MEQ tablet Take 30 mEq by mouth daily. Take 1.5 tabs daily       . spironolactone (ALDACTONE) 25 MG tablet Take 25 mg by mouth 2 (two) times daily.      Marland Kitchen telmisartan (MICARDIS) 40 MG tablet Take 40 mg by mouth daily.        Marland Kitchen warfarin (COUMADIN) 6 MG tablet Take 3-6 mg by mouth daily. 6mg  on mondays 3 mg all other days        Allergies  Allergen Reactions  . Cleocin (Clindamycin Hcl) Diarrhea  . Codeine Itching  . Doxycycline Hyclate Diarrhea  . Morphine Hives  . Pentazocine Lactate Itching and Nausea And Vomiting  . Vibramycin (Doxycycline Calcium) Diarrhea  . Sulfonamide Derivatives Rash    Past Medical History  Diagnosis Date  . CAD (coronary artery disease)   . PAF (paroxysmal atrial fibrillation)   . Hypertension   . Hyperlipidemia   . GERD (gastroesophageal reflux disease)   . Hypokalemia   . Diabetes mellitus   . Depression   . Lumbar back pain   .  LBBB (left bundle branch block)   . Headache   . Lymphadenitis   . Arthritis   . Bursitis   . Chronic anticoagulation   . Heart palpitations   . Diastolic dysfunction     per echo in October 2012 with EF 50 to 55%  . 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 Angioplasty laminectomy  . Lumbar laminectomy   . Coronary artery bypass graft     LIMA to LAD   . Abdominal hysterectomy   . Cholecystectomy   . Tonsillectomy     Family History  Problem Relation Age of Onset  . Heart attack Father   . Heart disease Father   . Kidney disease    . Stroke    . Arthritis    . Hypertension    . Diabetes    . Stroke Mother     History   Social History  . Marital Status: Legally Separated    Spouse Name: N/A    Number of Children: N/A  . Years of Education: N/A   Occupational History  . Not on file.   Social History Main Topics  . Smoking status: Former Smoker    Quit date: 03/07/2001  .  Smokeless tobacco: Not on file  . Alcohol Use: No  . Drug Use: No  . Sexually Active: Not on file   Other Topics Concern  . Not on file   Social History Narrative  . No narrative on file    ROS: Please see the HPI.  All other systems reviewed and negative.  PHYSICAL EXAM:  BP 138/88  Pulse 74  Ht 5\' 2"  (1.575 m)  Wt 235 lb (106.595 kg)  BMI 42.98 kg/m2  General: Well developed, well nourished, in no acute distress. Head:  Normocephalic and atraumatic. Neck: no JVD Lungs: Clear to auscultation and percussion. Heart: Normal S1 and paradoxical splitting of S2.   Abdomen:  Normal bowel sounds; soft; non tender; no organomegaly Pulses: Pulses normal in all 4 extremities. Extremities: No clubbing or cyanosis. No edema. Neurologic: Alert and oriented x 3.  EKG:  NSR.  LBBB.   ASSESSMENT AND PLAN:

## 2011-09-01 ENCOUNTER — Ambulatory Visit (INDEPENDENT_AMBULATORY_CARE_PROVIDER_SITE_OTHER): Payer: Medicare Other | Admitting: Endocrinology

## 2011-09-01 ENCOUNTER — Other Ambulatory Visit (INDEPENDENT_AMBULATORY_CARE_PROVIDER_SITE_OTHER): Payer: Medicare Other

## 2011-09-01 VITALS — BP 104/66 | HR 78 | Temp 98.4°F | Wt 234.0 lb

## 2011-09-01 DIAGNOSIS — E1059 Type 1 diabetes mellitus with other circulatory complications: Secondary | ICD-10-CM

## 2011-09-01 DIAGNOSIS — I798 Other disorders of arteries, arterioles and capillaries in diseases classified elsewhere: Secondary | ICD-10-CM

## 2011-09-01 NOTE — Progress Notes (Signed)
Subjective:    Patient ID: Kristen [name], female    DOB: 01/28/1946, 66 y.o.   MRN: [id number]  HPI Pt returns for f/u of insulin-requiring DM (dx'ed 2008; complicated by CAD and PAD).  no cbg record, but states cbg's are mostly in the mid-100's.  It is in general higher as the day goes on.  She has only 1 episode of hypoglycemia since last ov, and this was mild--it was during a viral GI illness.  She often takes less than the prescribed insulin dosage.   Past Medical History  Diagnosis Date  . CAD (coronary artery disease)   . PAF (paroxysmal atrial fibrillation)   . Hypertension   . Hyperlipidemia   . GERD (gastroesophageal reflux disease)   . Hypokalemia   . Diabetes mellitus   . Depression   . Lumbar back pain   . LBBB (left bundle branch block)   . Headache   . Lymphadenitis   . Arthritis   . Bursitis   . Chronic anticoagulation   . Heart palpitations   . Diastolic dysfunction     per echo in October 2012 with EF 50 to 55%  . 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 Angioplasty laminectomy  . Lumbar laminectomy   . Coronary artery bypass graft     LIMA to LAD   . Abdominal hysterectomy   . Cholecystectomy   . Tonsillectomy     History   Social History  . Marital Status: Legally Separated    Spouse Name: N/A    Number of Children: N/A  . Years of Education: N/A   Occupational History  . Not on file.   Social History Main Topics  . Smoking status: Former Smoker    Quit date: 03/07/2001  . Smokeless tobacco: Not on file  . Alcohol Use: No  . Drug Use: No  . Sexually Active: Not on file   Other Topics Concern  . Not on file   Social History Narrative  . No narrative on file    Current Outpatient Prescriptions on File Prior to Visit  Medication Sig Dispense Refill  . ALPRAZolam (XANAX) 1 MG tablet Take 1 tablet (1 mg total) by mouth 3 (three) times daily.  30 tablet  5  . aspirin 81 MG tablet Take  81 mg by mouth daily.        Marland Kitchen atorvastatin (LIPITOR) 20 MG tablet Take 40 mg by mouth at bedtime.       . cyclobenzaprine (FLEXERIL) 10 MG tablet Take 10 mg by mouth 2 (two) times daily as needed. For muscle spasms      . digoxin (LANOXIN) 0.25 MG tablet Take 250 mcg by mouth daily.      . Exenatide (BYDUREON) 2 MG SUSR Inject 2 mg into the skin once a week. On mondays      . gabapentin (NEURONTIN) 300 MG capsule Take 300 mg by mouth 3 (three) times daily.      . hydrochlorothiazide (MICROZIDE) 12.5 MG capsule Take 12.5 mg by mouth daily.      Marland Kitchen HYDROcodone-acetaminophen (NORCO) 10-325 MG per tablet Take 1 tablet by mouth every 4 (four) hours as needed. For pain      . insulin aspart (NOVOLOG) 100 UNIT/ML injection Inject 65-75 Units into the skin 3 (three) times daily before meals. Take 65 units in the am, 70 units around lunch & 75 units at night      .  lansoprazole (PREVACID) 30 MG capsule Take 30 mg by mouth daily as needed. For heartburn      . metoprolol succinate (TOPROL-XL) 100 MG 24 hr tablet TAKE 1 TABLET BY MOUTH TWICE A DAY  60 tablet  6  . nitroGLYCERIN (NITROSTAT) 0.4 MG SL tablet Place 0.4 mg under the tongue every 5 (five) minutes as needed. For chest pain      . spironolactone (ALDACTONE) 25 MG tablet Take 25 mg by mouth 2 (two) times daily.      Marland Kitchen telmisartan (MICARDIS) 40 MG tablet Take 40 mg by mouth daily.        Marland Kitchen warfarin (COUMADIN) 6 MG tablet Take 3-6 mg by mouth daily. 6mg  on mondays 3 mg all other days      . KLOR-CON M20 20 MEQ tablet TAKE 1 & 1/2 TABLETS BY MOUTH ONCE A DAY  45 tablet  5   No current facility-administered medications on file prior to visit.    Allergies  Allergen Reactions  . Cleocin (Clindamycin Hcl) Diarrhea  . Codeine Itching  . Doxycycline Hyclate Diarrhea  . Morphine Hives  . Pentazocine Lactate Itching and Nausea And Vomiting  . Vibramycin (Doxycycline Calcium) Diarrhea  . Sulfonamide Derivatives Rash    Family History  Problem  Relation Age of Onset  . Heart attack Father   . Heart disease Father   . Kidney disease    . Stroke    . Arthritis    . Hypertension    . Diabetes    . Stroke Mother     BP 104/66  Pulse 78  Temp 98.4 F (36.9 C) (Oral)  Wt 234 lb (106.142 kg)  SpO2 96%    Review of Systems Denies loc.    Objective:   Physical Exam VITAL SIGNS:  See vs page GENERAL: no distress Pulses: dorsalis pedis intact bilat.   Feet: no deformity.  no ulcer on the feet.  feet are of normal color and temp.  no edema Neuro: sensation is intact to touch on the feet    Lab Results  Component Value Date   HGBA1C 8.9* 09/01/2011      Assessment & Plan:  DM.  needs increased rx

## 2011-09-01 NOTE — Patient Instructions (Addendum)
blood tests are being requested for you today.  You will receive a letter with results. check your blood sugar 2 times a day.  vary the time of day when you check, between before the 3 meals, and at bedtime.  also check if you have symptoms of your blood sugar being too high or too low.  please keep a record of the readings and bring it to your next appointment here.  please call us sooner if you are having low blood sugar episodes.  Please make a follow-up appointment in 3 months.  

## 2011-09-02 ENCOUNTER — Telehealth: Payer: Self-pay | Admitting: *Deleted

## 2011-09-02 NOTE — Telephone Encounter (Signed)
Called pt to inform of lab results, pt informed (letter also mailed to pt). 

## 2011-09-06 ENCOUNTER — Other Ambulatory Visit: Payer: Self-pay | Admitting: Internal Medicine

## 2011-09-07 ENCOUNTER — Ambulatory Visit (INDEPENDENT_AMBULATORY_CARE_PROVIDER_SITE_OTHER): Payer: Medicare Other | Admitting: Internal Medicine

## 2011-09-07 ENCOUNTER — Encounter: Payer: Self-pay | Admitting: Internal Medicine

## 2011-09-07 VITALS — BP 130/78 | HR 76 | Temp 98.0°F | Resp 16 | Ht 62.0 in | Wt 232.0 lb

## 2011-09-07 DIAGNOSIS — M25519 Pain in unspecified shoulder: Secondary | ICD-10-CM

## 2011-09-07 DIAGNOSIS — T887XXA Unspecified adverse effect of drug or medicament, initial encounter: Secondary | ICD-10-CM

## 2011-09-07 DIAGNOSIS — I2510 Atherosclerotic heart disease of native coronary artery without angina pectoris: Secondary | ICD-10-CM

## 2011-09-07 DIAGNOSIS — I10 Essential (primary) hypertension: Secondary | ICD-10-CM

## 2011-09-07 DIAGNOSIS — M719 Bursopathy, unspecified: Secondary | ICD-10-CM

## 2011-09-07 MED ORDER — DICLOFENAC EPOLAMINE 1.3 % TD PTCH: 1.0000 | MEDICATED_PATCH | Freq: Two times a day (BID) | TRANSDERMAL | Status: DC

## 2011-09-07 MED ORDER — METHYLPREDNISOLONE ACETATE 40 MG/ML IJ SUSP: 40.0000 mg | Freq: Once | INTRAMUSCULAR | Status: DC

## 2011-09-07 NOTE — Patient Instructions (Signed)
The patient is instructed to continue all medications as prescribed. Schedule followup with check out clerk upon leaving the clinic  

## 2011-09-07 NOTE — Progress Notes (Signed)
Subjective:    Patient ID: Kristen [name], female    DOB: 14-Jun-1945, 66 y.o.   MRN: [id number]  HPI Patient is a 66 year old female who had a severe reaction to Cleocin and she developed what was most probably antibiotic associated diarrhea.  She was seen by Dr. Kirtland Bouchard and was placed on a probiotic And the diarrhea has stopped indicating that she must probably did not develop C. difficile colitis.  She is also seen by Dr. Tedra Senegal 2 days ago.... for atypical chest pain in the right shoulder. And severe pain underneath her right arm.  There is no pleuritic component to the pain there is no ischemia noted in her upper extremities. Severity of the pain is increased by certain positions   Review of Systems  Constitutional: Positive for fatigue. Negative for activity change and appetite change.  HENT: Negative for ear pain, congestion, neck pain, postnasal drip and sinus pressure.   Eyes: Negative for redness and visual disturbance.  Respiratory: Positive for shortness of breath. Negative for cough and wheezing.   Gastrointestinal: Negative for abdominal pain and abdominal distention.  Genitourinary: Negative for dysuria, frequency and menstrual problem.  Musculoskeletal: Positive for myalgias and joint swelling. Negative for arthralgias.  Skin: Negative for rash and wound.  Neurological: Positive for weakness. Negative for dizziness and headaches.  Hematological: Negative for adenopathy. Does not bruise/bleed easily.  Psychiatric/Behavioral: Negative for disturbed wake/sleep cycle and decreased concentration.   Past Medical History  Diagnosis Date  . CAD (coronary artery disease)   . PAF (paroxysmal atrial fibrillation)   . Hypertension   . Hyperlipidemia   . GERD (gastroesophageal reflux disease)   . Hypokalemia   . Diabetes mellitus   . Depression   . Lumbar back pain   . LBBB (left bundle branch block)   . Headache   . Lymphadenitis   . Arthritis   . Bursitis   . Chronic  anticoagulation   . Heart palpitations   . Diastolic dysfunction     per echo in October 2012 with EF 50 to 55%  . 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 Marital Status: Legally Separated    Spouse Name: N/A    Number of Children: N/A  . Years of Education: N/A   Occupational History  . Not on file.   Social History Main Topics  . Smoking status: Former Smoker    Quit date: 03/07/2001  . Smokeless tobacco: Not on file  . Alcohol Use: No  . Drug Use: No  . Sexually Active: Not on file   Other Topics Concern  . Not on file   Social History Narrative  . No narrative on file    Past Surgical History  Procedure Date  . Angioplasty laminectomy  . Lumbar laminectomy   . Coronary artery bypass graft     LIMA to LAD   . Abdominal hysterectomy   . Cholecystectomy   . Tonsillectomy     Family History  Problem Relation Age of Onset  . Heart attack Father   . Heart disease Father   . Kidney disease    . Stroke    . Arthritis    . Hypertension    . Diabetes    . Stroke Mother     Allergies  Allergen Reactions  . Cleocin (Clindamycin Hcl) Diarrhea  . Codeine Itching  . Doxycycline Hyclate Diarrhea  . Morphine Hives  . Pentazocine Lactate Itching and  Nausea And Vomiting  . Vibramycin (Doxycycline Calcium) Diarrhea  . Sulfonamide Derivatives Rash    Current Outpatient Prescriptions on File Prior to Visit  Medication Sig Dispense Refill  . ALPRAZolam (XANAX) 1 MG tablet Take 1 tablet (1 mg total) by mouth 3 (three) times daily.  30 tablet  5  . aspirin 81 MG tablet Take 81 mg by mouth daily.        Marland Kitchen atorvastatin (LIPITOR) 20 MG tablet Take 40 mg by mouth at bedtime.       . cyclobenzaprine (FLEXERIL) 10 MG tablet Take 10 mg by mouth 2 (two) times daily as needed. For muscle spasms      . digoxin (LANOXIN) 0.25 MG tablet Take 250 mcg by mouth daily.      . Exenatide (BYDUREON) 2 MG SUSR Inject 2 mg into the skin once a week. On  mondays      . gabapentin (NEURONTIN) 300 MG capsule Take 300 mg by mouth 3 (three) times daily.      . hydrochlorothiazide (MICROZIDE) 12.5 MG capsule Take 12.5 mg by mouth daily.      Marland Kitchen HYDROcodone-acetaminophen (NORCO) 10-325 MG per tablet Take 1 tablet by mouth every 4 (four) hours as needed. For pain      . insulin aspart (NOVOLOG) 100 UNIT/ML injection Inject 65-75 Units into the skin 3 (three) times daily before meals. Take 65 units in the am, 70 units around lunch & 75 units at night      . KLOR-CON M20 20 MEQ tablet TAKE 1 & 1/2 TABLETS BY MOUTH ONCE A DAY  45 tablet  5  . lansoprazole (PREVACID) 30 MG capsule Take 30 mg by mouth daily as needed. For heartburn      . metoprolol succinate (TOPROL-XL) 100 MG 24 hr tablet TAKE 1 TABLET BY MOUTH TWICE A DAY  60 tablet  6  . nitroGLYCERIN (NITROSTAT) 0.4 MG SL tablet Place 0.4 mg under the tongue every 5 (five) minutes as needed. For chest pain      . spironolactone (ALDACTONE) 25 MG tablet Take 25 mg by mouth 2 (two) times daily.      Marland Kitchen telmisartan (MICARDIS) 40 MG tablet Take 40 mg by mouth daily.        Marland Kitchen warfarin (COUMADIN) 6 MG tablet Take 3-6 mg by mouth daily. 6mg  on mondays 3 mg all other days        BP 130/78  Pulse 76  Temp 98 F (36.7 C)  Resp 16  Ht 5\' 2"  (1.575 m)  Wt 232 lb (105.235 kg)  BMI 42.43 kg/m2        Objective:   Physical Exam  Nursing note and vitals reviewed. Constitutional: She appears well-developed and well-nourished. No distress.  HENT:  Head: Normocephalic and atraumatic.  Right Ear: External ear normal.  Left Ear: External ear normal.  Nose: Nose normal.  Mouth/Throat: Oropharynx is clear and moist.  Eyes: Conjunctivae and EOM are normal. Pupils are equal, round, and reactive to light.  Neck: Normal range of motion. Neck supple. No JVD present. No tracheal deviation present. No thyromegaly present.  Cardiovascular: Normal rate, regular rhythm, normal heart sounds and intact distal pulses.     No murmur heard. Pulmonary/Chest: Effort normal and breath sounds normal. She has no wheezes. She exhibits no tenderness.  Abdominal: Soft. Bowel sounds are normal.  Musculoskeletal: She exhibits edema and tenderness.  Lymphadenopathy:    She has no cervical adenopathy.  Neurological: She has normal  reflexes. No cranial nerve deficit.  Skin: Skin is warm and dry. She is not diaphoretic.  Psychiatric: She has a normal mood and affect. Her behavior is normal.          Assessment & Plan:  Given her point injection in the trapezius muscle for pain relief and placed a fleck or patch she will use of Lipitor patches every 12 hours.  Her blood pressure stable on her current medications given her samples of her insulin for her diabetes and will followup in 2 months time   Informed consent obtained and the patient's right shoulder area was prepped with betadine. Local anesthesia was obtained with topical spray. Then 40 mg of Depo-Medrol and 1/2 cc of lidocaine was injected into the joint space. The patient tolerated the procedure without complications. Post injection care discussed with patient.

## 2011-09-12 ENCOUNTER — Telehealth: Payer: Self-pay | Admitting: *Deleted

## 2011-09-12 NOTE — Telephone Encounter (Signed)
Pt states that [employer] received her application for patient assistance for Novolog insulin and she states that they sent her a letter stating that her social security number was invalid and that she also had to send them some more information regarding her finances. Pt states that she doesn't understand why they told her that her SS# was invalid, informed pt that I would call [employer] to see what other information they needed pt to send in and also concerning her SS#.

## 2011-09-14 ENCOUNTER — Ambulatory Visit (INDEPENDENT_AMBULATORY_CARE_PROVIDER_SITE_OTHER): Payer: Medicare Other | Admitting: Pharmacist

## 2011-09-14 DIAGNOSIS — Z7901 Long term (current) use of anticoagulants: Secondary | ICD-10-CM

## 2011-09-14 DIAGNOSIS — I4891 Unspecified atrial fibrillation: Secondary | ICD-10-CM

## 2011-09-14 NOTE — Telephone Encounter (Signed)
Tried to Call [facility] pt assistance customer service-got disconnected-will call again later.

## 2011-09-18 NOTE — Assessment & Plan Note (Addendum)
Her symptoms continue to be what sounds more like musculoskeletal skeletal type in origin. She seems to be worse with elevation of the arm. She does have fairly prominent sweating spells. We may eventually need to consider a more aggressive evaluation. I reminded her again today about the need for hydration. She is clearly aware of this.  I have encouraged her to follow up with Dr. Lovell Sheehan.  This is clearly aggravated by changes in position, and she may need a more thorough eval of the shoulder joint.

## 2011-09-18 NOTE — Assessment & Plan Note (Signed)
Currently normal rhythm.  Is on chronic warfarin therapy.

## 2011-09-22 NOTE — Telephone Encounter (Signed)
Faxed information pt brought in regarding Medicare documentation needed by [facility] to faxed number provided.

## 2011-09-23 ENCOUNTER — Telehealth: Payer: Self-pay | Admitting: Family Medicine

## 2011-09-23 MED ORDER — HYDROCODONE-ACETAMINOPHEN 10-325 MG PO TABS: 1.0000 | ORAL_TABLET | ORAL | Status: DC | PRN

## 2011-09-23 NOTE — Telephone Encounter (Signed)
3 refills called in and pharmacy informed to call pt and let her know it is ready

## 2011-09-23 NOTE — Telephone Encounter (Signed)
Pt calling back. Contacted her pharmacy ([facility]) yesterday about refilling her HYDROcodone-acetaminophen (NORCO) 10-325 MG per tablet Per pharm, they faxed it to [place] yesterday for approval. Pt calling now, wondering if this will be approved today, because she is down to 7 pills. Please call to advise. Thanks!

## 2011-10-06 ENCOUNTER — Telehealth: Payer: Self-pay | Admitting: *Deleted

## 2011-10-06 NOTE — Telephone Encounter (Signed)
Called pt to inform that Novolog pt assistance is ready for pickup. Novolog patient assistance in back fridge on side B.

## 2011-10-13 ENCOUNTER — Telehealth: Payer: Self-pay | Admitting: Endocrinology

## 2011-10-13 MED ORDER — INSULIN PEN NEEDLE 31G X 8 MM MISC: Status: DC

## 2011-10-13 NOTE — Telephone Encounter (Signed)
Rx printed, awaiting MD's signature.  

## 2011-10-13 NOTE — Telephone Encounter (Signed)
Rx for pen needles faxed to [facility].

## 2011-10-13 NOTE — Telephone Encounter (Signed)
Pt needs an RX sent to patient assistance for needles to [employer]

## 2011-10-17 ENCOUNTER — Ambulatory Visit (INDEPENDENT_AMBULATORY_CARE_PROVIDER_SITE_OTHER): Payer: Medicare Other | Admitting: Pharmacist

## 2011-10-17 DIAGNOSIS — I4891 Unspecified atrial fibrillation: Secondary | ICD-10-CM

## 2011-10-17 DIAGNOSIS — Z7901 Long term (current) use of anticoagulants: Secondary | ICD-10-CM

## 2011-10-25 ENCOUNTER — Other Ambulatory Visit: Payer: Self-pay | Admitting: Internal Medicine

## 2011-10-26 ENCOUNTER — Telehealth: Payer: Self-pay | Admitting: Internal Medicine

## 2011-10-26 NOTE — Telephone Encounter (Signed)
Caller: Takeisha/Patient; Patient Name: Kristen [name]; PCP: Darryll Capers; [facility] Callback Phone Number: [phone].  Onset 10/23/11 with rectal itching.  Pt also reports some constipation. Pt reports LBM 10/24/11.  Pt is afebrile. All emergent symptoms in the Rectal Symptoms Protocol ruled out with exception to 'Severe rectal itching not relieved with homecare'.  Home care advice given and RN offered pt an appt within next 2 days.  Pt will have to call back once she arranges transportation.

## 2011-10-27 ENCOUNTER — Encounter: Payer: Self-pay | Admitting: Family Medicine

## 2011-10-27 ENCOUNTER — Ambulatory Visit (INDEPENDENT_AMBULATORY_CARE_PROVIDER_SITE_OTHER): Payer: Medicare Other | Admitting: Family Medicine

## 2011-10-27 VITALS — BP 122/80 | HR 96 | Temp 98.3°F | Wt 242.0 lb

## 2011-10-27 DIAGNOSIS — L290 Pruritus ani: Secondary | ICD-10-CM

## 2011-10-27 MED ORDER — HYDROCORTISONE 2.5 % RE CREA: TOPICAL_CREAM | Freq: Two times a day (BID) | RECTAL | Status: AC

## 2011-10-27 NOTE — Progress Notes (Signed)
  Subjective:    Patient ID: Kristen [name], female    DOB: April 13, 1945, 66 y.o.   MRN: [id number]  HPI Here for 3 days of burning and itching around the anus. No bleeding. BMs are normal. Using Preparation H.    Review of Systems  Constitutional: Negative.        Objective:   Physical Exam  Constitutional: She appears well-developed and well-nourished.  Genitourinary:       Perianal area is red and inflamed, no fissures or hemorrhoids           Assessment & Plan:  Switch to Anusol HC cream prn

## 2011-10-31 ENCOUNTER — Telehealth: Payer: Self-pay | Admitting: Internal Medicine

## 2011-10-31 NOTE — Telephone Encounter (Signed)
Dr jenkins agrees 

## 2011-10-31 NOTE — Telephone Encounter (Signed)
Caller: Avabella/Patient; Patient Name: Kristen [name]; PCP: Darryll Capers; [facility] Callback Phone Number: [phone]. Reason for call: color change in urine. Caller reports beginning on Thurs 8/22 or Friday 8/23 she began to notice her urine being a darker yellow. Caller reports she drinks lots of water and her urine is normally light yellow. Caller denies dysuria, no blood in urine, but she has had two episodes of urgency and frequency in recent past. Recently treated for uti. Afebrile. Per Urinary Symptoms- Female, has one or more urinary tract symptoms and has not been previously elevated.  See in 24 hrs. Caller offered an appt for Tues 8/27. She is unsure if she can find a ride or not. She will try to get ride and RN will call her back in one hour re scheduling of appointment. She is agreeable. Upon callback, Caller reports she can not find a ride in next 2-3 days. She may be able to get a ride on Friday 8/30. Info re future appts given as requested. Home care for sxs given with callback parameters. She will manage sxs at home and callback prn.

## 2011-11-01 ENCOUNTER — Telehealth: Payer: Self-pay | Admitting: Cardiology

## 2011-11-01 DIAGNOSIS — I4891 Unspecified atrial fibrillation: Secondary | ICD-10-CM

## 2011-11-01 NOTE — Telephone Encounter (Signed)
Please return call to patient at hM# regarding instructions from lab and medication

## 2011-11-01 NOTE — Telephone Encounter (Signed)
The pt called because she has noticed a change in the color of her urine.  The pt did contact her PCP but cannot make an appointment due to lack of transportation.  The pt states that she can get a ride to come into our office on Friday for labs.  I made the pt aware that we can order a BMP.  I also made the pt aware that she can get a UA done in our office but Dr York Ram will need to order this test and follow-up on results. The pt will contact her PCP.

## 2011-11-04 ENCOUNTER — Telehealth: Payer: Self-pay | Admitting: Internal Medicine

## 2011-11-04 ENCOUNTER — Other Ambulatory Visit (INDEPENDENT_AMBULATORY_CARE_PROVIDER_SITE_OTHER): Payer: Medicare Other

## 2011-11-04 DIAGNOSIS — I4891 Unspecified atrial fibrillation: Secondary | ICD-10-CM

## 2011-11-04 LAB — BASIC METABOLIC PANEL
BUN: 12 mg/dL (ref 6–23)
CO2: 23 mEq/L (ref 19–32)
Calcium: 10 mg/dL (ref 8.4–10.5)
Chloride: 107 mEq/L (ref 96–112)
Creatinine, Ser: 0.9 mg/dL (ref 0.4–1.2)
Glucose, Bld: 158 mg/dL — ABNORMAL HIGH (ref 70–99)

## 2011-11-04 NOTE — Telephone Encounter (Signed)
Caller: Emiya/Patient; Patient Name: Kristen [name]; PCP: Darryll Capers (Adults only); Best Callback Phone Number: [phone].  Call regarding Diarrhea, onset 8-27, short-of-breath, onset 8-29.  Diarrhea has improved per Patient.   Patient is in route to Cardiology lab for blood work.  Advised Patient to call 911 or go to Emergency Room.  Patient will go to Cardiology lab and have someone check on her first.  Breathing Problems Protocol used.

## 2011-11-15 ENCOUNTER — Telehealth: Payer: Self-pay | Admitting: Cardiology

## 2011-11-15 NOTE — Telephone Encounter (Signed)
New problem:  C/O  Increase heart rate around 3 am 165. , left shoulder with some pain. Took 1/2 toprol.

## 2011-11-15 NOTE — Telephone Encounter (Signed)
Pt calling stating she woke up about 3am with rapid heart rate around 165--did have slight SOB ,but no CP--did c/o some shoulder pain ,but this is not new and does not appear to be related to episode of fast heart rate--pt stated she took 1/2 toprol and within . Heart converted back to normal rate--advised--please call us sooner  the next time it happens and we can bring you in for EKG to see if you are in a-fib or some other rhythm, also taking 1/2 toprol was exactly the right thing to do --pt agrees and will call sooner if another episode

## 2011-11-23 ENCOUNTER — Ambulatory Visit (INDEPENDENT_AMBULATORY_CARE_PROVIDER_SITE_OTHER): Payer: Medicare Other | Admitting: Cardiology

## 2011-11-23 ENCOUNTER — Ambulatory Visit (INDEPENDENT_AMBULATORY_CARE_PROVIDER_SITE_OTHER): Payer: Medicare Other | Admitting: *Deleted

## 2011-11-23 VITALS — BP 110/72 | HR 79 | Resp 18 | Ht 62.0 in | Wt 241.0 lb

## 2011-11-23 DIAGNOSIS — Z7901 Long term (current) use of anticoagulants: Secondary | ICD-10-CM

## 2011-11-23 DIAGNOSIS — I2510 Atherosclerotic heart disease of native coronary artery without angina pectoris: Secondary | ICD-10-CM

## 2011-11-23 DIAGNOSIS — IMO0001 Reserved for inherently not codable concepts without codable children: Secondary | ICD-10-CM

## 2011-11-23 DIAGNOSIS — I4891 Unspecified atrial fibrillation: Secondary | ICD-10-CM

## 2011-11-23 DIAGNOSIS — R61 Generalized hyperhidrosis: Secondary | ICD-10-CM

## 2011-11-23 LAB — POCT INR: INR: 2.1

## 2011-11-23 NOTE — Telephone Encounter (Signed)
The pt is scheduled for an appointment today with Dr Riley Kill.

## 2011-11-23 NOTE — Patient Instructions (Signed)
Your physician recommends that you schedule a follow-up appointment in: 3 MONTHS  Your physician recommends that you continue on your current medications as directed. Please refer to the Current Medication list given to you today.   

## 2011-11-23 NOTE — Progress Notes (Signed)
HPI:  Kristen [name] returns for follow up.  Week ago Friday she broke into a sweat between 10:30 and 11 PM. She became swimmy headed and had a strange feeling. It lasted for about 5 minutes for down got wet. She waited to check her blood sugar, and is about 200. She says that it was not unlike what she has had with hypoglycemia, although it's not clear that she obtained a sugar at that time. The sugar also may have been obtained after she ate something. She has had an episode where she had a rapid heartbeat, took an extra metoprolol, and this all settled down.  Current Outpatient Prescriptions  Medication Sig Dispense Refill  . ALPRAZolam (XANAX) 1 MG tablet Take 1 tablet (1 mg total) by mouth 3 (three) times daily.  30 tablet  5  . aspirin 81 MG tablet Take 81 mg by mouth daily.        Marland Kitchen atorvastatin (LIPITOR) 20 MG tablet Take 40 mg by mouth at bedtime.       . cyclobenzaprine (FLEXERIL) 10 MG tablet Take 10 mg by mouth 2 (two) times daily as needed. For muscle spasms      . diclofenac (FLECTOR) 1.3 % PTCH Place 1 patch onto the skin 2 (two) times daily.      . digoxin (LANOXIN) 0.25 MG tablet Take 250 mcg by mouth daily.      . Exenatide (BYDUREON) 2 MG SUSR Inject 2 mg into the skin once a week. On mondays      . gabapentin (NEURONTIN) 300 MG capsule Take 300 mg by mouth 3 (three) times daily.      Marland Kitchen gabapentin (NEURONTIN) 300 MG capsule TAKE 1 CAPSULE THREE TIMES A DAY  90 capsule  2  . hydrochlorothiazide (MICROZIDE) 12.5 MG capsule Take 12.5 mg by mouth daily.      Marland Kitchen HYDROcodone-acetaminophen (NORCO) 10-325 MG per tablet Take 1 tablet by mouth every 4 (four) hours as needed. For pain  150 tablet  3  . insulin aspart (NOVOLOG) 100 UNIT/ML injection Inject 65-75 Units into the skin 3 (three) times daily before meals. Take 65 units in the am, 70 units around lunch & 75 units at night      . Insulin Pen Needle 31G X 8 MM MISC Use as directed three times a day  300 each  2  . KLOR-CON M20 20 MEQ tablet  TAKE 1 & 1/2 TABLETS BY MOUTH ONCE A DAY  45 tablet  5  . lansoprazole (PREVACID) 30 MG capsule Take 30 mg by mouth daily as needed. For heartburn      . metoprolol succinate (TOPROL-XL) 100 MG 24 hr tablet TAKE 1 TABLET BY MOUTH TWICE A DAY  60 tablet  6  . nitroGLYCERIN (NITROSTAT) 0.4 MG SL tablet Place 0.4 mg under the tongue every 5 (five) minutes as needed. For chest pain      . ONE TOUCH ULTRA TEST test strip       . spironolactone (ALDACTONE) 25 MG tablet Take 25 mg by mouth 2 (two) times daily.      Marland Kitchen telmisartan (MICARDIS) 40 MG tablet Take 40 mg by mouth daily.        Marland Kitchen warfarin (COUMADIN) 6 MG tablet Take 3-6 mg by mouth daily. 6mg  on mondays 3 mg all other days       Current Facility-Administered Medications  Medication Dose Route Frequency Provider Last Rate Last Dose  . methylPREDNISolone acetate (DEPO-MEDROL) injection 40 mg  40 mg Intramuscular Once Stacie Glaze, MD        Allergies  Allergen Reactions  . Cleocin (Clindamycin Hcl) Diarrhea  . Codeine Itching  . Doxycycline Hyclate Diarrhea  . Morphine Hives  . Pentazocine Lactate Itching and Nausea And Vomiting  . Vibramycin (Doxycycline Calcium) Diarrhea  . Sulfonamide Derivatives Rash    Past Medical History  Diagnosis Date  . CAD (coronary artery disease)   . PAF (paroxysmal atrial fibrillation)   . Hypertension   . Hyperlipidemia   . GERD (gastroesophageal reflux disease)   . Hypokalemia   . Diabetes mellitus   . Depression   . Lumbar back pain   . LBBB (left bundle branch block)   . Headache   . Lymphadenitis   . Arthritis   . Bursitis   . Chronic anticoagulation   . Heart palpitations   . Diastolic dysfunction     per echo in October 2012 with EF 50 to 55%  . 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 Angioplasty laminectomy  . Lumbar laminectomy   . Coronary artery bypass graft     LIMA to LAD   . Abdominal hysterectomy   . Cholecystectomy   .  Tonsillectomy     Family History  Problem Relation Age of Onset  . Heart attack Father   . Heart disease Father   . Kidney disease    . Stroke    . Arthritis    . Hypertension    . Diabetes    . Stroke Mother     History   Social History  . Marital Status: Legally Separated    Spouse Name: N/A    Number of Children: N/A  . Years of Education: N/A   Occupational History  . Not on file.   Social History Main Topics  . Smoking status: Former Smoker    Quit date: 03/07/2001  . Smokeless tobacco: Never Used  . Alcohol Use: No  . Drug Use: No  . Sexually Active: Not on file   Other Topics Concern  . Not on file   Social History Narrative  . No narrative on file    ROS: Please see the HPI.  All other systems reviewed and negative.  PHYSICAL EXAM:  BP 110/72  Pulse 79  Resp 18  Ht 5\' 2"  (1.575 m)  Wt 241 lb (109.317 kg)  BMI 44.08 kg/m2  SpO2 95%  General: Well developed, well nourished, in no acute distress. Head:  Normocephalic and atraumatic. Neck: no JVD Lungs: Clear to auscultation and percussion. Heart: paradoxical S2.   Abdomen:  Normal bowel sounds; soft; non tender; no organomegaly Pulses: Pulses normal in all 4 extremities. Extremities: No clubbing or cyanosis. No edema. Neurologic: Alert and oriented x 3.  EKG:  NSR.  LBBB.   ASSESSMENT AND PLAN:

## 2011-11-27 DIAGNOSIS — IMO0001 Reserved for inherently not codable concepts without codable children: Secondary | ICD-10-CM | POA: Insufficient documentation

## 2011-11-27 NOTE — Assessment & Plan Note (Signed)
She may have had an episode of atrial fib.  Should she have any prolonged events, she knows to call us and get in so we can document.

## 2011-11-27 NOTE — Assessment & Plan Note (Signed)
Not sure if this represented an episode of hypglycemia.  She is a little unsure as to weather the sugar was earlier or later.  Instructions given to monitor.  Continued close follow up.

## 2011-11-27 NOTE — Assessment & Plan Note (Signed)
Has not had recent chest pain.  Continue to monitor.

## 2011-11-28 ENCOUNTER — Ambulatory Visit: Payer: Medicare Other | Admitting: Internal Medicine

## 2011-12-01 ENCOUNTER — Ambulatory Visit: Payer: Medicare Other | Admitting: Endocrinology

## 2011-12-09 ENCOUNTER — Ambulatory Visit (INDEPENDENT_AMBULATORY_CARE_PROVIDER_SITE_OTHER): Payer: Medicare Other | Admitting: Endocrinology

## 2011-12-09 ENCOUNTER — Encounter: Payer: Self-pay | Admitting: Endocrinology

## 2011-12-09 VITALS — BP 134/76 | HR 82 | Temp 97.4°F | Resp 16 | Wt 240.4 lb

## 2011-12-09 DIAGNOSIS — E1059 Type 1 diabetes mellitus with other circulatory complications: Secondary | ICD-10-CM

## 2011-12-09 NOTE — Patient Instructions (Addendum)
blood tests are being requested for you today.  You will be contacted with results. check your blood sugar 2 times a day.  vary the time of day when you check, between before the 3 meals, and at bedtime.  also check if you have symptoms of your blood sugar being too high or too low.  please keep a record of the readings and bring it to your next appointment here.  please call us sooner if you are having low blood sugar episodes. Please make a follow-up appointment in 3 months.   You may be better with a once-a-day insulin, taken each morning

## 2011-12-09 NOTE — Progress Notes (Signed)
Subjective:    Patient ID: Kristen [name], female    DOB: 1946/01/16, 66 y.o.   MRN: [id number]  HPI Pt returns for f/u of insulin-requiring DM (dx'ed 2008; complicated by CAD and PAD).  no cbg record, but states cbg's are mostly in the mid-100's.  pt states she feels well in general, except for chronic arthralgias.  She misses approx 1-2 insulin doses per week.  She says she takes the full amount when she does take it.   Past Medical History  Diagnosis Date  . CAD (coronary artery disease)   . PAF (paroxysmal atrial fibrillation)   . Hypertension   . Hyperlipidemia   . GERD (gastroesophageal reflux disease)   . Hypokalemia   . Diabetes mellitus   . Depression   . Lumbar back pain   . LBBB (left bundle branch block)   . Headache   . Lymphadenitis   . Arthritis   . Bursitis   . Chronic anticoagulation   . Heart palpitations   . Diastolic dysfunction     per echo in October 2012 with EF 50 to 55%  . 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 Angioplasty laminectomy  . Lumbar laminectomy   . Coronary artery bypass graft     LIMA to LAD   . Abdominal hysterectomy   . Cholecystectomy   . Tonsillectomy     History   Social History  . Marital Status: Legally Separated    Spouse Name: N/A    Number of Children: N/A  . Years of Education: N/A   Occupational History  . Not on file.   Social History Main Topics  . Smoking status: Former Smoker    Quit date: 03/07/2001  . Smokeless tobacco: Never Used  . Alcohol Use: No  . Drug Use: No  . Sexually Active: Not on file   Other Topics Concern  . Not on file   Social History Narrative  . No narrative on file    Current Outpatient Prescriptions on File Prior to Visit  Medication Sig Dispense Refill  . ALPRAZolam (XANAX) 1 MG tablet Take 1 tablet (1 mg total) by mouth 3 (three) times daily.  30 tablet  5  . aspirin 81 MG tablet Take 81 mg by mouth daily.        Marland Kitchen  atorvastatin (LIPITOR) 20 MG tablet Take 40 mg by mouth at bedtime.       . cyclobenzaprine (FLEXERIL) 10 MG tablet Take 10 mg by mouth 2 (two) times daily as needed. For muscle spasms      . diclofenac (FLECTOR) 1.3 % PTCH Place 1 patch onto the skin 2 (two) times daily.      . digoxin (LANOXIN) 0.25 MG tablet Take 250 mcg by mouth daily.      . Exenatide (BYDUREON) 2 MG SUSR Inject 2 mg into the skin once a week. On mondays      . gabapentin (NEURONTIN) 300 MG capsule Take 300 mg by mouth 3 (three) times daily.      Marland Kitchen gabapentin (NEURONTIN) 300 MG capsule TAKE 1 CAPSULE THREE TIMES A DAY  90 capsule  2  . hydrochlorothiazide (MICROZIDE) 12.5 MG capsule Take 12.5 mg by mouth daily.      Marland Kitchen HYDROcodone-acetaminophen (NORCO) 10-325 MG per tablet Take 1 tablet by mouth every 4 (four) hours as needed. For pain  150 tablet  3  . insulin aspart (NOVOLOG)  100 UNIT/ML injection Take 65 units with breakfast, 70 units with lunch & 75 units with the evening meal      . Insulin Pen Needle 31G X 8 MM MISC Use as directed three times a day  300 each  2  . KLOR-CON M20 20 MEQ tablet TAKE 1 & 1/2 TABLETS BY MOUTH ONCE A DAY  45 tablet  5  . lansoprazole (PREVACID) 30 MG capsule Take 30 mg by mouth daily as needed. For heartburn      . metoprolol succinate (TOPROL-XL) 100 MG 24 hr tablet TAKE 1 TABLET BY MOUTH TWICE A DAY  60 tablet  6  . nitroGLYCERIN (NITROSTAT) 0.4 MG SL tablet Place 0.4 mg under the tongue every 5 (five) minutes as needed. For chest pain      . ONE TOUCH ULTRA TEST test strip       . spironolactone (ALDACTONE) 25 MG tablet Take 25 mg by mouth 2 (two) times daily.      Marland Kitchen telmisartan (MICARDIS) 40 MG tablet Take 40 mg by mouth daily.        Marland Kitchen warfarin (COUMADIN) 6 MG tablet Take 3-6 mg by mouth daily. 6mg  on mondays 3 mg all other days       Current Facility-Administered Medications on File Prior to Visit  Medication Dose Route Frequency Provider Last Rate Last Dose  . methylPREDNISolone  acetate (DEPO-MEDROL) injection 40 mg  40 mg Intramuscular Once Stacie Glaze, MD        Allergies  Allergen Reactions  . Cleocin (Clindamycin Hcl) Diarrhea  . Codeine Itching  . Doxycycline Hyclate Diarrhea  . Morphine Hives  . Pentazocine Lactate Itching and Nausea And Vomiting  . Vibramycin (Doxycycline Calcium) Diarrhea  . Sulfonamide Derivatives Rash    Family History  Problem Relation Age of Onset  . Heart attack Father   . Heart disease Father   . Kidney disease    . Stroke    . Arthritis    . Hypertension    . Diabetes    . Stroke Mother    BP 134/76  Pulse 82  Temp 97.4 F (36.3 C) (Oral)  Resp 16  Wt 240 lb 7 oz (109.062 kg)  Review of Systems denies hypoglycemia.    Objective:   Physical Exam VITAL SIGNS:  See vs page.  GENERAL: no distress.  SKIN:  Insulin injection sites at the anterior abdomen are normal.   Lab Results  Component Value Date   HGBA1C 8.9* 12/09/2011      Assessment & Plan:  DM, therapy limited by noncompliance.  i'll do the best i can.  She may need a simpler regimen

## 2011-12-12 ENCOUNTER — Encounter: Payer: Self-pay | Admitting: Internal Medicine

## 2011-12-12 ENCOUNTER — Ambulatory Visit (INDEPENDENT_AMBULATORY_CARE_PROVIDER_SITE_OTHER): Payer: Medicare Other | Admitting: Internal Medicine

## 2011-12-12 VITALS — BP 140/80 | HR 80 | Temp 98.2°F | Resp 18 | Ht 62.0 in | Wt 240.0 lb

## 2011-12-12 DIAGNOSIS — Z23 Encounter for immunization: Secondary | ICD-10-CM

## 2011-12-12 DIAGNOSIS — N816 Rectocele: Secondary | ICD-10-CM

## 2011-12-12 DIAGNOSIS — I10 Essential (primary) hypertension: Secondary | ICD-10-CM

## 2011-12-12 DIAGNOSIS — E1165 Type 2 diabetes mellitus with hyperglycemia: Secondary | ICD-10-CM

## 2011-12-12 DIAGNOSIS — E1169 Type 2 diabetes mellitus with other specified complication: Secondary | ICD-10-CM

## 2011-12-12 NOTE — Progress Notes (Signed)
  Subjective:    Patient ID: Kristen [name], female    DOB: Apr 06, 1945, 66 y.o.   MRN: [id number]  HPI The pt has stool incontinence with probable rectoceole HTN stable No current chest pain     Review of Systems  Constitutional: Positive for fatigue. Negative for activity change and appetite change.  HENT: Positive for congestion and rhinorrhea. Negative for ear pain, neck pain, postnasal drip and sinus pressure.   Eyes: Negative for redness and visual disturbance.  Respiratory: Negative for cough, shortness of breath and wheezing.   Gastrointestinal: Positive for abdominal distention. Negative for abdominal pain.  Genitourinary: Positive for urgency, frequency and pelvic pain. Negative for dysuria and menstrual problem.  Musculoskeletal: Positive for back pain and joint swelling. Negative for myalgias and arthralgias.  Skin: Negative for rash and wound.  Neurological: Positive for weakness. Negative for dizziness and headaches.  Hematological: Negative for adenopathy. Does not bruise/bleed easily.  Psychiatric/Behavioral: Positive for dysphoric mood and decreased concentration. Negative for disturbed wake/sleep cycle.       Objective:   Physical Exam  Nursing note and vitals reviewed. Constitutional: She is oriented to person, place, and time. She appears well-nourished.  HENT:  Head: Atraumatic.  Neck: Normal range of motion. Neck supple.  Cardiovascular: Normal rate.   Murmur heard. Pulmonary/Chest: She has wheezes.  Abdominal: She exhibits distension. There is tenderness.  Musculoskeletal: She exhibits tenderness.  Neurological: She is alert and oriented to person, place, and time.          Assessment & Plan:   Rectocele with stool incontinence Blood pressure is stable Followed by endocrine for DM Morbid obesity with IBS ( constipation prone) discussed gluten free.

## 2011-12-12 NOTE — Patient Instructions (Addendum)
The patient is instructed to continue all medications as prescribed. Schedule followup with check out clerk upon leaving the clinic  

## 2011-12-14 ENCOUNTER — Other Ambulatory Visit: Payer: Self-pay | Admitting: Internal Medicine

## 2011-12-14 MED ORDER — FLUCONAZOLE 150 MG PO TABS: 150.0000 mg | ORAL_TABLET | Freq: Once | ORAL | Status: DC

## 2011-12-14 NOTE — Telephone Encounter (Signed)
Caller: [name]/Patient; Patient Name: Kristen [name]; PCP: Darryll Capers [facility]); Best Callback Phone Number: [phone]; Reason for call: Patient states she feels "wetness in the rectal area" and when she wipes she see's stool on the tissue. This has been occurring for two weeks  11/30/11. Diagnosed with Rectocele with stool incontinence   She has an appointment 12/29/11 with Dr. Joneen Caraway (?) OB/GYN . LMP- hysterectomy.  She is now experiencing some rectal and vaginal itching  onset yesterday 12/13/11.  No vaginal discharge, "skin is irritated". +itching.  She states she normally gets Diflucan for this issue and is requesting medication. Emgergent s/sx Vaginal Discharge or Irritation protocol with the exception of "Genital Itching , burning or redness".  See provider in 24 hours. Patient cannot make appointment on short notice. Requesting medication. Home care and guidelines reviewed Understanding expressed. PLEASE CONTACT PATIENT FOR MEDICATION. UNABLE TO GET TRANSPORTATION FOR APPOINTMENT.

## 2011-12-14 NOTE — Telephone Encounter (Signed)
Ok per Dr Jenkins, rx sent in electronically 

## 2011-12-15 ENCOUNTER — Telehealth: Payer: Self-pay | Admitting: Endocrinology

## 2011-12-15 NOTE — Telephone Encounter (Signed)
Pt's dose of Novalog was increased during previous fu appt. Pt is currently participating in Novalog drug assistance program and Novalog needs to be made aware of dosage increase so they will continue covering her medications. Please call pt with any questions.

## 2011-12-16 NOTE — Telephone Encounter (Signed)
Pt states to disregard messag, already been addressed

## 2011-12-16 NOTE — Telephone Encounter (Signed)
LMOVM for pt to rtn call 

## 2011-12-22 ENCOUNTER — Telehealth: Payer: Self-pay | Admitting: Internal Medicine

## 2011-12-22 ENCOUNTER — Other Ambulatory Visit: Payer: Self-pay | Admitting: Endocrinology

## 2011-12-22 NOTE — Telephone Encounter (Signed)
Pt states that pharmacy never received the confirmation of insulin dosage increase. We previously faxed the information. Pt would like [place] to send it again... Fax # [phone].

## 2011-12-22 NOTE — Telephone Encounter (Signed)
Caller: [name]/Patient; Patient Name: Kristen [name]; PCP: Darryll Capers (Adults only); Best Callback Phone Number: [phone].  Regrding Garcinia Cambogia which is a diet pill.  Pt. is trying to lose weight.  She is on coumadin and is concerned that there may be an adverse reaction.  Apparrently Dr. Neil Crouch reccommends this for weight loss.  She would like to know your view on taking this.  CAN/db.

## 2011-12-23 ENCOUNTER — Other Ambulatory Visit: Payer: Self-pay | Admitting: General Practice

## 2011-12-23 NOTE — Telephone Encounter (Signed)
Paperwork re-faxed on 10/18.

## 2011-12-26 NOTE — Telephone Encounter (Signed)
Pt informed

## 2011-12-26 NOTE — Telephone Encounter (Signed)
Not for heart patients

## 2011-12-29 ENCOUNTER — Other Ambulatory Visit: Payer: Self-pay | Admitting: Endocrinology

## 2011-12-29 DIAGNOSIS — E1059 Type 1 diabetes mellitus with other circulatory complications: Secondary | ICD-10-CM

## 2011-12-29 NOTE — Telephone Encounter (Signed)
Pt states that pharmacy told her we still had her rx for Novalog incorrect. She states she is not on vials but instead takes the flex pens. She wants someone to call her back ASAP regarding this issue. [phone].

## 2011-12-29 NOTE — Telephone Encounter (Signed)
Spoke with pt who said her insulin got refilled as vials instead of the flex pens.  They want [place] to call and authorize flex pens instead of vials.  She has never used the vials before.  She does not want to start now.  [phone].

## 2011-12-29 NOTE — Telephone Encounter (Signed)
Pens ok same dosage

## 2011-12-30 ENCOUNTER — Other Ambulatory Visit: Payer: Self-pay | Admitting: Internal Medicine

## 2011-12-30 MED ORDER — INSULIN ASPART 100 UNIT/ML ~~LOC~~ SOLN: SUBCUTANEOUS | Status: DC

## 2011-12-30 NOTE — Telephone Encounter (Signed)
Meds changed, and refilled.

## 2012-01-02 ENCOUNTER — Ambulatory Visit (INDEPENDENT_AMBULATORY_CARE_PROVIDER_SITE_OTHER): Payer: Medicare Other

## 2012-01-02 DIAGNOSIS — I4891 Unspecified atrial fibrillation: Secondary | ICD-10-CM

## 2012-01-02 DIAGNOSIS — Z7901 Long term (current) use of anticoagulants: Secondary | ICD-10-CM

## 2012-01-09 ENCOUNTER — Telehealth: Payer: Self-pay | Admitting: Internal Medicine

## 2012-01-09 ENCOUNTER — Encounter (HOSPITAL_COMMUNITY): Payer: Self-pay | Admitting: Emergency Medicine

## 2012-01-09 ENCOUNTER — Emergency Department (HOSPITAL_COMMUNITY)
Admission: EM | Admit: 2012-01-09 | Discharge: 2012-01-10 | Disposition: A | Discharge status: Home / Self Care | Payer: Medicare Other | Attending: Emergency Medicine | Admitting: Emergency Medicine

## 2012-01-09 ENCOUNTER — Emergency Department (HOSPITAL_COMMUNITY): Payer: Medicare Other

## 2012-01-09 DIAGNOSIS — Z794 Long term (current) use of insulin: Secondary | ICD-10-CM | POA: Insufficient documentation

## 2012-01-09 DIAGNOSIS — E785 Hyperlipidemia, unspecified: Secondary | ICD-10-CM | POA: Insufficient documentation

## 2012-01-09 DIAGNOSIS — I2510 Atherosclerotic heart disease of native coronary artery without angina pectoris: Secondary | ICD-10-CM | POA: Insufficient documentation

## 2012-01-09 DIAGNOSIS — M7989 Other specified soft tissue disorders: Secondary | ICD-10-CM | POA: Insufficient documentation

## 2012-01-09 DIAGNOSIS — Z951 Presence of aortocoronary bypass graft: Secondary | ICD-10-CM | POA: Insufficient documentation

## 2012-01-09 DIAGNOSIS — K219 Gastro-esophageal reflux disease without esophagitis: Secondary | ICD-10-CM | POA: Insufficient documentation

## 2012-01-09 DIAGNOSIS — Z862 Personal history of diseases of the blood and blood-forming organs and certain disorders involving the immune mechanism: Secondary | ICD-10-CM | POA: Insufficient documentation

## 2012-01-09 DIAGNOSIS — R002 Palpitations: Secondary | ICD-10-CM | POA: Insufficient documentation

## 2012-01-09 DIAGNOSIS — I447 Left bundle-branch block, unspecified: Secondary | ICD-10-CM | POA: Insufficient documentation

## 2012-01-09 DIAGNOSIS — F3289 Other specified depressive episodes: Secondary | ICD-10-CM | POA: Insufficient documentation

## 2012-01-09 DIAGNOSIS — Z7901 Long term (current) use of anticoagulants: Secondary | ICD-10-CM | POA: Insufficient documentation

## 2012-01-09 DIAGNOSIS — Z8639 Personal history of other endocrine, nutritional and metabolic disease: Secondary | ICD-10-CM | POA: Insufficient documentation

## 2012-01-09 DIAGNOSIS — I10 Essential (primary) hypertension: Secondary | ICD-10-CM | POA: Insufficient documentation

## 2012-01-09 DIAGNOSIS — Z7982 Long term (current) use of aspirin: Secondary | ICD-10-CM | POA: Insufficient documentation

## 2012-01-09 DIAGNOSIS — I889 Nonspecific lymphadenitis, unspecified: Secondary | ICD-10-CM | POA: Insufficient documentation

## 2012-01-09 DIAGNOSIS — Z87891 Personal history of nicotine dependence: Secondary | ICD-10-CM | POA: Insufficient documentation

## 2012-01-09 DIAGNOSIS — D689 Coagulation defect, unspecified: Secondary | ICD-10-CM | POA: Insufficient documentation

## 2012-01-09 DIAGNOSIS — F329 Major depressive disorder, single episode, unspecified: Secondary | ICD-10-CM | POA: Insufficient documentation

## 2012-01-09 DIAGNOSIS — Z79899 Other long term (current) drug therapy: Secondary | ICD-10-CM | POA: Insufficient documentation

## 2012-01-09 DIAGNOSIS — Z8673 Personal history of transient ischemic attack (TIA), and cerebral infarction without residual deficits: Secondary | ICD-10-CM | POA: Insufficient documentation

## 2012-01-09 DIAGNOSIS — E119 Type 2 diabetes mellitus without complications: Secondary | ICD-10-CM | POA: Insufficient documentation

## 2012-01-09 DIAGNOSIS — I4891 Unspecified atrial fibrillation: Secondary | ICD-10-CM | POA: Insufficient documentation

## 2012-01-09 DIAGNOSIS — M7150 Other bursitis, not elsewhere classified, unspecified site: Secondary | ICD-10-CM | POA: Insufficient documentation

## 2012-01-09 LAB — CBC WITH DIFFERENTIAL/PLATELET
Eosinophils Relative: 3 % (ref 0–5)
HCT: 40.5 % (ref 36.0–46.0)
Lymphocytes Relative: 49 % — ABNORMAL HIGH (ref 12–46)
Lymphs Abs: 2.5 10*3/uL (ref 0.7–4.0)
MCV: 94 fL (ref 78.0–100.0)
Monocytes Absolute: 0.3 10*3/uL (ref 0.1–1.0)
RBC: 4.31 MIL/uL (ref 3.87–5.11)
WBC: 5.1 10*3/uL (ref 4.0–10.5)

## 2012-01-09 LAB — BASIC METABOLIC PANEL
BUN: 16 mg/dL (ref 6–23)
CO2: 24 mEq/L (ref 19–32)
Calcium: 9.8 mg/dL (ref 8.4–10.5)
Creatinine, Ser: 0.65 mg/dL (ref 0.50–1.10)
Glucose, Bld: 250 mg/dL — ABNORMAL HIGH (ref 70–99)

## 2012-01-09 LAB — GLUCOSE, CAPILLARY

## 2012-01-09 LAB — POCT I-STAT TROPONIN I: Troponin i, poc: 0.01 ng/mL (ref 0.00–0.08)

## 2012-01-09 LAB — PROTIME-INR: INR: 1.9 — ABNORMAL HIGH (ref 0.00–1.49)

## 2012-01-09 MED ORDER — OXYCODONE-ACETAMINOPHEN 5-325 MG PO TABS
2.0000 | ORAL_TABLET | Freq: Once | ORAL | Status: AC
  Administered 2012-01-09: 1 via ORAL
  Filled 2012-01-09: qty 2

## 2012-01-09 NOTE — ED Notes (Signed)
Per EMS: pt with mild left leg pitting edema x 3 days;

## 2012-01-09 NOTE — Telephone Encounter (Signed)
Patient calling,  has left left edema from her knee down.  Has pain in both knees but no swelling in the right lower leg.   Has exertional shortness of breath.  Also having left shoulder pain from time to time.  Then she added that she has an odd feeling in her chest at times. Triaged per Edema, Atraumatic.  911 disposition given.  She agreed to do this.

## 2012-01-09 NOTE — ED Provider Notes (Signed)
History     CSN: [id number]  Arrival date & time 01/09/12  1559   First MD Initiated Contact with Patient 01/09/12 2102      Chief Complaint  Patient presents with  . Leg Swelling    (Consider location/radiation/quality/duration/timing/severity/associated sxs/prior treatment) HPI Pt here with multiple complaints. She called her PMD this AM to schedule an appointment for evaluation of mild edema worse on left than right, left knee pain, left shoulder pain. Nurse for PMD concerned about left shoulder pain and recommendation made for evaluation here.  Pt had been in waiting room for several hours and now complains of low back pain. Pain described as moderate in intensity. The location of the patient's problem is low back.  Onset was past few hours with worsening course since that time.   Modifying factors:  Worse with sitting, better with lying.  Associated symptoms: No fever, no incontinence. Pt does not have any left leg pain. No swelling. No erythema.   Past Medical History  Diagnosis Date  . CAD (coronary artery disease)   . PAF (paroxysmal atrial fibrillation)   . Hypertension   . Hyperlipidemia   . GERD (gastroesophageal reflux disease)   . Hypokalemia   . Diabetes mellitus   . Depression   . Lumbar back pain   . LBBB (left bundle branch block)   . Headache   . Lymphadenitis   . Arthritis   . Bursitis   . Chronic anticoagulation   . Heart palpitations   . Diastolic dysfunction     per echo in October 2012 with EF 50 to 55%  . 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 Angioplasty laminectomy  . Lumbar laminectomy   . Coronary artery bypass graft     LIMA to LAD   . Abdominal hysterectomy   . Cholecystectomy   . Tonsillectomy     Family History  Problem Relation Age of Onset  . Heart attack Father   . Heart disease Father   . Kidney disease    . Stroke    . Arthritis    . Hypertension    . Diabetes    . Stroke  Mother     History  Substance Use Topics  . Smoking status: Former Smoker    Quit date: 03/07/2001  . Smokeless tobacco: Never Used  . Alcohol Use: No    OB History    Grav Para Term Preterm Abortions TAB SAB Ect Mult Living                  Review of Systems Negative for respiratory distress, cough. Negative for chest pain. Negative for vomiting, diarrhea.  All other systems reviewed and negative unless noted in HPI.    Allergies  Cleocin; Codeine; Doxycycline hyclate; Morphine; Pentazocine lactate; Vibramycin; and Sulfonamide derivatives  Home Medications   Current Outpatient Rx  Name  Route  Sig  Dispense  Refill  . ALPRAZOLAM 1 MG PO TABS   Oral   Take 1 tablet (1 mg total) by mouth 3 (three) times daily.   30 tablet   5   . ASPIRIN 81 MG PO TABS   Oral   Take 81 mg by mouth daily.           . ATORVASTATIN CALCIUM 20 MG PO TABS   Oral   Take 40 mg by mouth at bedtime.          Marland Kitchen  CYCLOBENZAPRINE HCL 10 MG PO TABS   Oral   Take 10 mg by mouth 2 (two) times daily as needed. For muscle spasms         . DICLOFENAC EPOLAMINE 1.3 % TD PTCH   Transdermal   Place 1 patch onto the skin 2 (two) times daily.         Marland Kitchen DIGOXIN 0.25 MG PO TABS   Oral   Take 250 mcg by mouth daily.         Marland Kitchen EXENATIDE 2 MG [place] SUSR   Subcutaneous   Inject 2 mg into the skin once a week. On mondays         . GABAPENTIN 300 MG PO CAPS      TAKE 1 CAPSULE THREE TIMES A DAY   90 capsule   2   . HYDROCHLOROTHIAZIDE 12.5 MG PO CAPS   Oral   Take 12.5 mg by mouth daily.         Marland Kitchen HYDROCODONE-ACETAMINOPHEN 10-325 MG PO TABS   Oral   Take 1 tablet by mouth every 4 (four) hours as needed. For pain   150 tablet   3   . INSULIN ASPART 100 UNIT/ML [place] SOLN      Inject into the skin 3 times a day (just before each meal) 65-70-75 units.   75 mL   3   . INSULIN PEN NEEDLE 31G X 8 MM MISC      Use as directed three times a day   300 each   2   . KLOR-CON M20 20  MEQ PO TBCR      TAKE 1 & 1/2 TABLETS BY MOUTH ONCE A DAY   45 tablet   5   . LANSOPRAZOLE 30 MG PO CPDR   Oral   Take 30 mg by mouth daily as needed. For heartburn         . METOPROLOL SUCCINATE ER 100 MG PO TB24      TAKE 1 TABLET BY MOUTH TWICE A DAY   60 tablet   6   . NITROGLYCERIN 0.4 MG SL SUBL   Sublingual   Place 0.4 mg under the tongue every 5 (five) minutes as needed. For chest pain         . ONETOUCH ULTRA BLUE VI STRP               . SPIRONOLACTONE 25 MG PO TABS   Oral   Take 25 mg by mouth 2 (two) times daily.         . TELMISARTAN 40 MG PO TABS   Oral   Take 40 mg by mouth daily.           . WARFARIN SODIUM 6 MG PO TABS   Oral   Take 3-6 mg by mouth daily. 6mg  on mondays 3 mg all other days           BP 132/59  Pulse 78  Temp 98.4 F (36.9 C) (Oral)  Resp 22  SpO2 98%  Physical Exam Nursing note and vitals reviewed.  Constitutional: Pt is alert and appears stated age. Oropharynx: Airway open without erythema or exudate. Respiratory: No respiratory distress. Equal breathing bilaterally. CV: Extremities warm and well perfused. Trace edema in bilateral lower extremity. Left slightly more than right.  Neuro: No motor nor sensory deficit. Head: Normocephalic and atraumatic. Eyes: No conjunctivitis, no scleral icterus. Neck: Supple, no mass. Chest: Non-tender. Abdomen: Soft, non-tender MSK: Extremities are atraumatic  without deformity. Left shoulder tender to palpation. Limited ROM.  Skin: No rash, no wounds.  ED Course  Procedures (including critical care time)  Labs Reviewed  CBC WITH DIFFERENTIAL - Abnormal; Notable for the following:    Neutrophils Relative 42 (*)     Lymphocytes Relative 49 (*)     All other components within normal limits  BASIC METABOLIC PANEL - Abnormal; Notable for the following:    Glucose, Bld 250 (*)     All other components within normal limits  PROTIME-INR - Abnormal; Notable for the following:     Prothrombin Time 21.1 (*)     INR 1.90 (*)     All other components within normal limits  GLUCOSE, CAPILLARY - Abnormal; Notable for the following:    Glucose-Capillary 130 (*)     All other components within normal limits  PRO B NATRIURETIC PEPTIDE  APTT  POCT I-STAT TROPONIN I   Dg Chest Port 1 View  01/09/2012  *RADIOLOGY REPORT*  Clinical Data: Shortness of breath.  Lower extremity swelling.  PORTABLE CHEST - 1 VIEW 01/09/2012 2150 hours:  Comparison: Two-view chest x-ray 12/14/2010, 09/23/2009.  Findings: Prior sternotomy for CABG.  Cardiac silhouette upper normal in size for the AP portable technique.  Lungs clear. Bronchovascular markings normal.  Pulmonary vascularity normal.  No pneumothorax.  No pleural effusions.  IMPRESSION: No acute cardiopulmonary disease.   Original Report Authenticated By: Hulan Saas, M.D.      1. Leg swelling       MDM  66 y.o. female here with leg swelling, multiple other complaints. It was recommended to her to present here by PMD staff for evaluation when she was trying to set up an appt with them.  Pertinent past problems include DM on insulin, CAD, diastolic dysfunction. Looks well here. No signs of over heart failure. No signs of DVT. No chest pain to suggest ACS or PE. No acute traumatic injuries. Will give PO percocet for chronic back pain. Will work up swelling to r/o heart failure. EKG with normal rate, LBBB and non-specific ST changes which is unchanged from prior.   Lab tests ordered and reviewed by me: CBC, BMP unremarkable. BNP low. Troponin low. INR at 1.9.  I independently viewed the following imaging studies and reviewed radiology's interpretation as summarized: CXR with no signs of edema or consolidation.  Course of care: On re-eval, pt resting comfortably with NAD. Discussed findings and provided reassurance regarding no signs of acute heart failure or ACS at this time. Feel pt is stable for d/c. Plan for close follow up.  Counseling provided regarding diagnosis, treatment plan, follow up recommendations, and return precautions. Questions answered.  Medical Decision Making discussed with ED attending Celene Kras, MD          Charm Barges, MD 01/10/12 [phone]

## 2012-01-09 NOTE — ED Notes (Signed)
MD at bedside. 

## 2012-01-09 NOTE — Telephone Encounter (Signed)
ED notification 

## 2012-01-10 NOTE — ED Notes (Signed)
The patient is AOx4 and comfortable with her discharge instructions. 

## 2012-01-10 NOTE — ED Provider Notes (Signed)
I saw and evaluated the patient, reviewed the resident's note and I agree with the findings and plan. I reviewed and interpreted the EKG during the patient's evaluation in the ED and agree with the resident's interpretation.  Pt presented with multiple complaints.   At this time there does not appear to be any evidence of an acute emergency medical condition and the patient appears stable for discharge with appropriate outpatient follow up.   Celene Kras, MD 01/10/12 [phone]

## 2012-01-17 ENCOUNTER — Telehealth: Payer: Self-pay | Admitting: Internal Medicine

## 2012-01-17 ENCOUNTER — Telehealth: Payer: Self-pay

## 2012-01-17 NOTE — Telephone Encounter (Signed)
Caller: [name]/Patient; Patient Name: Kristen [name]; PCP: Darryll Capers (Adults only); Best Callback Phone Number: [phone].  Patient states she was seen in ED 01/09/12  for shortness of breath, pain in shoulder, and swelling of left leg.  States continues to have swelling of that leg, along with pain up into left knee as well.  Seen in GYN office and had ultrasound for possible rectocele which was negative, but states she has a lot of stool, "which apparently I am not emptying."  States would like referral for colonoscopy as well.  States has breakdown of the skin due to leakage of liquid stool, which she believes is from an impaction.   Last BM 01/16/12.  States has been treated for skin breakdown Proctosol HC 2.5%.   Currently left leg has some mild swelling, which she can feel on the sole of the foot when she walks.  When seen in ED, no evidence of fluid in lungs, and ECG was stable.  Per constipation and edema protocols, emergent symptoms denied; advised and offered appts within 24 hours.  Declines due to transportation but states she will call the office in AM 01/18/12 to schedule appt.

## 2012-01-17 NOTE — Telephone Encounter (Signed)
Faxed refill order (per error in ordering) to [facility] [employer] [facility], for 13 more pens. Called pt and apologized for the error. Told pt we faxed a new order in.

## 2012-01-17 NOTE — Telephone Encounter (Signed)
Pt called stating she still did not get enough insulin.   She is getting from the company due to be doing in the "donut hole".  She said the form was sent in stating she is taking 75 units total daily, but she is on 210 units daily.  They only sent her 5 vials based on the 75 units instead of 13 vials actually need to last her a month.  She can explain if you have any questions.

## 2012-01-20 ENCOUNTER — Other Ambulatory Visit: Payer: Self-pay | Admitting: Internal Medicine

## 2012-01-20 ENCOUNTER — Ambulatory Visit (INDEPENDENT_AMBULATORY_CARE_PROVIDER_SITE_OTHER): Payer: Medicare Other | Admitting: Family

## 2012-01-20 ENCOUNTER — Encounter: Payer: Self-pay | Admitting: Family

## 2012-01-20 VITALS — BP 138/84 | HR 92 | Temp 98.0°F | Wt 243.0 lb

## 2012-01-20 DIAGNOSIS — R600 Localized edema: Secondary | ICD-10-CM

## 2012-01-20 DIAGNOSIS — M719 Bursopathy, unspecified: Secondary | ICD-10-CM

## 2012-01-20 DIAGNOSIS — R609 Edema, unspecified: Secondary | ICD-10-CM

## 2012-01-20 DIAGNOSIS — M7150 Other bursitis, not elsewhere classified, unspecified site: Secondary | ICD-10-CM

## 2012-01-20 LAB — RENAL FUNCTION PANEL
GFR: 79.02 mL/min (ref >60.00)
Glucose, Bld: 126 mg/dL — ABNORMAL HIGH (ref 70–99)
Phosphorus: 3.1 mg/dL (ref 2.3–4.6)
Potassium: 4 mEq/L (ref 3.5–5.1)
Sodium: 141 mEq/L (ref 135–145)

## 2012-01-20 LAB — T3, FREE: T3, Free: 2.8 pg/mL (ref 2.3–4.2)

## 2012-01-20 LAB — TSH: TSH: 3.44 u[IU]/mL (ref 0.35–5.50)

## 2012-01-20 MED ORDER — FUROSEMIDE 20 MG PO TABS: 20.0000 mg | ORAL_TABLET | Freq: Every day | ORAL | Status: DC

## 2012-01-20 NOTE — Progress Notes (Signed)
Subjective:    Patient ID: Kristen [name], female    DOB: [birth year], 66 y.o.   MRN: [id number]  HPI 66 year old non smoking African American female with complaints of increasing amount of fluid in legs bilaterally that began three weeks ago and intermittent edema in left arm for 2 years. Pt reports that she sleeps on left sides and notices that edema is worst on the left side in the morning.PT also complains of left shoulder pain that is chronic. Pt denies DOE but also states that she avoid situations that involve walking long distances and has sedentary lifestyle.   C/o left shoulder pain. Has a joint injection to the right shoulder that was effective but declining a cortisone injection today. Pain is worse with movement and to touch. Taking OTC meds with no relief.   Patient reports the c/o constipation. She has been taking Miralax with no relief. Has abdominal discomfort. Also reports her gynecologist saying "she has stool in her vaginal canal." She would like to see Dr. Juanda Chance.    Review of Systems  Constitutional: Negative.  Negative for fever.  HENT: Negative.   Eyes: Negative.   Respiratory: Negative.   Cardiovascular: Positive for leg swelling. Negative for chest pain and palpitations.  Gastrointestinal: Positive for constipation.  Genitourinary: Negative.   Musculoskeletal: Positive for myalgias.       Left shoulder pain  Skin: Negative.   Neurological: Negative.   Hematological: Negative.   Psychiatric/Behavioral: Negative.    Past Medical History  Diagnosis Date  . CAD (coronary artery disease)   . PAF (paroxysmal atrial fibrillation)   . Hypertension   . Hyperlipidemia   . GERD (gastroesophageal reflux disease)   . Hypokalemia   . Diabetes mellitus   . Depression   . Lumbar back pain   . LBBB (left bundle branch block)   . Headache   . Lymphadenitis   . Arthritis   . Bursitis   . Chronic anticoagulation   . Heart palpitations   . Diastolic dysfunction       per echo in October 2012 with EF 50 to 55%  . 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 Marital Status: Legally Separated    Spouse Name: N/A    Number of Children: N/A  . Years of Education: N/A   Occupational History  . Not on file.   Social History Main Topics  . Smoking status: Former Smoker    Quit date: 03/07/2001  . Smokeless tobacco: Never Used  . Alcohol Use: No  . Drug Use: No  . Sexually Active: Not on file   Other Topics Concern  . Not on file   Social History Narrative  . No narrative on file    Past Surgical History  Procedure Date  . Angioplasty laminectomy  . Lumbar laminectomy   . Coronary artery bypass graft     LIMA to LAD   . Abdominal hysterectomy   . Cholecystectomy   . Tonsillectomy     Family History  Problem Relation Age of Onset  . Heart attack Father   . Heart disease Father   . Kidney disease    . Stroke    . Arthritis    . Hypertension    . Diabetes    . Stroke Mother     Allergies  Allergen Reactions  . Cleocin (Clindamycin Hcl) Diarrhea  . Codeine Itching  . Doxycycline Hyclate Diarrhea  .  Morphine Hives  . Pentazocine Lactate Itching and Nausea And Vomiting  . Vibramycin (Doxycycline Calcium) Diarrhea  . Sulfonamide Derivatives Rash    Current Outpatient Prescriptions on File Prior to Visit  Medication Sig Dispense Refill  . ALPRAZolam (XANAX) 1 MG tablet Take 1 tablet (1 mg total) by mouth 3 (three) times daily.  30 tablet  5  . aspirin 81 MG tablet Take 81 mg by mouth daily.        Marland Kitchen atorvastatin (LIPITOR) 20 MG tablet Take 40 mg by mouth at bedtime.       . cyclobenzaprine (FLEXERIL) 10 MG tablet Take 10 mg by mouth 2 (two) times daily as needed. For muscle spasms      . diclofenac (FLECTOR) 1.3 % PTCH Place 1 patch onto the skin 2 (two) times daily.      . digoxin (LANOXIN) 0.25 MG tablet Take 250 mcg by mouth daily.      . Exenatide (BYDUREON) 2 MG SUSR Inject 2 mg into the  skin once a week. On mondays      . gabapentin (NEURONTIN) 300 MG capsule TAKE 1 CAPSULE THREE TIMES A DAY  90 capsule  2  . hydrochlorothiazide (MICROZIDE) 12.5 MG capsule Take 12.5 mg by mouth daily.      Marland Kitchen HYDROcodone-acetaminophen (NORCO) 10-325 MG per tablet Take 1 tablet by mouth every 4 (four) hours as needed. For pain  150 tablet  3  . insulin aspart (NOVOLOG FLEXPEN) 100 UNIT/ML injection Inject into the skin 3 times a day (just before each meal) 65-70-75 units.  75 mL  3  . Insulin Pen Needle 31G X 8 MM MISC Use as directed three times a day  300 each  2  . KLOR-CON M20 20 MEQ tablet TAKE 1 & 1/2 TABLETS BY MOUTH ONCE A DAY  45 tablet  5  . lansoprazole (PREVACID) 30 MG capsule Take 30 mg by mouth daily as needed. For heartburn      . metoprolol succinate (TOPROL-XL) 100 MG 24 hr tablet TAKE 1 TABLET BY MOUTH TWICE A DAY  60 tablet  6  . nitroGLYCERIN (NITROSTAT) 0.4 MG SL tablet Place 0.4 mg under the tongue every 5 (five) minutes as needed. For chest pain      . ONE TOUCH ULTRA TEST test strip       . spironolactone (ALDACTONE) 25 MG tablet Take 25 mg by mouth 2 (two) times daily.      Marland Kitchen telmisartan (MICARDIS) 40 MG tablet Take 40 mg by mouth daily.        Marland Kitchen warfarin (COUMADIN) 6 MG tablet Take 3-6 mg by mouth daily. 6mg  on mondays 3 mg all other days      . furosemide (LASIX) 20 MG tablet Take 1 tablet (20 mg total) by mouth daily.  7 tablet  0   Current Facility-Administered Medications on File Prior to Visit  Medication Dose Route Frequency Provider Last Rate Last Dose  . methylPREDNISolone acetate (DEPO-MEDROL) injection 40 mg  40 mg Intramuscular Once Stacie Glaze, MD        BP 138/84  Pulse 92  Temp 98 F (36.7 C) (Oral)  Wt 243 lb (110.224 kg)  SpO2 96%chart     Objective:   Physical Exam  Constitutional: She is oriented to person, place, and time. She appears well-developed and well-nourished.  HENT:  Head: Normocephalic and atraumatic.  Eyes: Pupils are equal,  round, and reactive to light.  Neck: Normal range of  motion. Neck supple.  Cardiovascular: Normal rate, regular rhythm and normal heart sounds.   Pulmonary/Chest: Effort normal and breath sounds normal.  Abdominal: Soft. Bowel sounds are normal.  Musculoskeletal: She exhibits edema and tenderness.       Left arm with mild swelling and tenderness to palpation in the brachioradialis muscle  Bilaterally lower extremities with +1 pitting edema, distal pulses intact  Neurological: She is alert and oriented to person, place, and time.  Skin: Skin is warm and dry.  Psychiatric: She has a normal mood and affect. Her behavior is normal. Judgment and thought content normal.          Assessment & Plan:  Assessment: Peripheral Edema, Left shoulder Bursitis,    Plan: Peripheral Edema likely related to sodium retention.  Lasix 20 mg daily for 7 days Increase K-DUR to 40mg  daily for 7 days LABS: TSH, T3, T4, renal panel Refer to Gastro Follow up if symptoms persist or become worst.

## 2012-01-20 NOTE — Patient Instructions (Addendum)
Take two 20mg  KDUR r for 7 days while taking  furosemide

## 2012-01-23 ENCOUNTER — Telehealth: Payer: Self-pay | Admitting: Family

## 2012-01-23 NOTE — Telephone Encounter (Signed)
Pt anxious for results of her profile /lab work done 01/20/12. Pls call pt at your earliest convenience. Thank you. [phone]

## 2012-01-23 NOTE — Telephone Encounter (Signed)
Pt aware of results and Results mailed to pt  

## 2012-01-25 ENCOUNTER — Telehealth: Payer: Self-pay | Admitting: Internal Medicine

## 2012-01-25 NOTE — Telephone Encounter (Signed)
Pt states she is having problems with constipation. States she took Senokot and she has taken miralax but they have not helped. Discussed with pt that if she were impacted she would need to be seen at the ER. Pt states she really isn't impacted, she states she has stool up high that will not come out. States she is not emptying completely. Pt requesting to be seen. Pt scheduled to see Amy Esterwood PA 01/27/12@10 :30am. Pt aware of appt date and time.

## 2012-01-25 NOTE — Telephone Encounter (Signed)
Pt had and EGD with Dr. Marina Goodell 10/23/2003. Name was not hyphenated at the time, listed as Ellyn Hack.

## 2012-01-26 ENCOUNTER — Encounter: Payer: Self-pay | Admitting: *Deleted

## 2012-01-26 ENCOUNTER — Other Ambulatory Visit: Payer: Self-pay | Admitting: Internal Medicine

## 2012-01-27 ENCOUNTER — Telehealth: Payer: Self-pay | Admitting: Internal Medicine

## 2012-01-27 ENCOUNTER — Ambulatory Visit: Payer: Self-pay | Admitting: Internal Medicine

## 2012-01-27 ENCOUNTER — Ambulatory Visit: Payer: Medicare Other | Admitting: Physician Assistant

## 2012-01-27 DIAGNOSIS — Z7901 Long term (current) use of anticoagulants: Secondary | ICD-10-CM

## 2012-01-27 DIAGNOSIS — I4891 Unspecified atrial fibrillation: Secondary | ICD-10-CM

## 2012-01-27 MED ORDER — CEPHALEXIN 500 MG PO CAPS: 500.0000 mg | ORAL_CAPSULE | Freq: Three times a day (TID) | ORAL | Status: DC

## 2012-01-27 NOTE — Telephone Encounter (Signed)
Patient Information:  Caller Name: Saher  Phone: [phone]  Patient: Kristen [name]  Gender: Female  DOB: December 16, 1945  Age: 66 Years  PCP: Darryll Capers (Adults only)   Symptoms  Reason For Call & Symptoms: Edema L leg.  Seen in office 01/20/12 and given lasix, but has not improved.  Feels numbness in L foot under the toes in the ball of the foot.  Also has a quarter sized lump which is soft as well.  Reviewed Health History In EMR: Yes  Reviewed Medications In EMR: Yes  Reviewed Allergies In EMR: Yes  Date of Onset of Symptoms: 01/06/2012  Guideline(s) Used:  Leg Swelling and Edema  Disposition Per Guideline:   See Today in Office  Reason For Disposition Reached:   Looks like a boil, infected sore, deep ulcer, or other infected rash (spreading redness, pus)  Advice Given:  N/A  Office Follow Up:  Does the office need to follow up with this patient?: No  Instructions For The Office: N/A  Patient Refused Recommendation:  Patient Refused Appt, Patient Requests Appt At Later Date  Appt scheduled 01/28/12 0900 in [place] office.  krs/can  RN Note:  Pain is worsening over past 48 hours in left foot/leg.  States has no transportation, and prefers appt 01/30/12.  Unable to schedule appt 01/30/12; appt scheduled 01/28/12 0900 in [place] office.  krs/can

## 2012-01-27 NOTE — Telephone Encounter (Signed)
Per [name],  Keflex 500 tid x 7 days and keep appointment.

## 2012-01-28 ENCOUNTER — Encounter: Payer: Self-pay | Admitting: Family Medicine

## 2012-01-28 ENCOUNTER — Ambulatory Visit (INDEPENDENT_AMBULATORY_CARE_PROVIDER_SITE_OTHER): Payer: Medicare Other | Admitting: Family Medicine

## 2012-01-28 VITALS — BP 130/80 | HR 82 | Temp 98.4°F | Resp 16 | Wt 241.0 lb

## 2012-01-28 DIAGNOSIS — M65311 Trigger thumb, right thumb: Secondary | ICD-10-CM

## 2012-01-28 DIAGNOSIS — M8430XA Stress fracture, unspecified site, initial encounter for fracture: Secondary | ICD-10-CM

## 2012-01-28 DIAGNOSIS — M79673 Pain in unspecified foot: Secondary | ICD-10-CM

## 2012-01-28 DIAGNOSIS — M79609 Pain in unspecified limb: Secondary | ICD-10-CM

## 2012-01-28 DIAGNOSIS — M6530 Trigger finger, unspecified finger: Secondary | ICD-10-CM

## 2012-01-28 MED ORDER — NONFORMULARY OR COMPOUNDED ITEM: Status: DC

## 2012-01-28 NOTE — Patient Instructions (Addendum)
F/u Dr. Lovell Sheehan 2 weeks for recheck

## 2012-01-28 NOTE — Progress Notes (Signed)
Nature conservation officer at [facility] [address] [place] [address] Phone: [phone] Fax: [phone]  Date:  01/28/2012   Name:  Kristen [name]   DOB:  05-May-1945   MRN:  [id number] Gender: female Age: 66 y.o.  PCP:  Carrie Mew, MD  Evaluating MD: Hannah Beat, MD   Chief Complaint: Foot Swelling   History of Present Illness:  Kristen [name] is a 66 y.o. pleasant patient who presents with the following:  Pt with multiple medical problems seen at Saturday clinic:  L foot Area on foot has been there for three weeks, then worse. Has gotten worse for the last few days, she is walking but she has a limp. Pain in the forefoot that has progressively been worsening over the last 3 weeks, and it has gotten decidedly worse in the last 3-4 days.  She does have DM, but denies numbness and tingling. No trauma or accident.   One small area of slight swelling near 2-3 TMT joint proximally.  R trigger thumb: classic catching with movement volar aspect of 1st CMP on the R with palpable nodule in area of a1 pulley  Patient Active Problem List  Diagnosis  . DIAB W/PERIPH CIRC D/O TYPE I [JUV TYPE] UNCNTRL  . HYPERLIPIDEMIA NEC/NOS  . HYPOKALEMIA  . CHRONIC LYMPHADENITIS  . GRIEF REACTION, ACUTE  . DEPRESSION  . HYPERTENSION  . CORONARY ARTERY DISEASE  . LBBB  . ATRIAL FIBRILLATION  . GERD  . OSTEOARTHRITIS  . SHOULDER PAIN, LEFT  . KNEE PAIN, RIGHT  . CERVICAL RADICULOPATHY, LEFT  . BACK PAIN, LUMBAR, WITH RADICULOPATHY  . BURSITIS, ACROMIOCLAVICULAR, LEFT  . HEADACHE  . RIB PAIN, LEFT SIDED  . Long term current use of anticoagulant  . Fibromyalgia  . Bruising  . Unspecified hypothyroidism  . Chest pain, musculoskeletal  . Dizziness  . Sweating    Past Medical History  Diagnosis Date  . CAD (coronary artery disease)   . PAF (paroxysmal atrial fibrillation)   . Hypertension   . Hyperlipidemia   . GERD (gastroesophageal reflux  disease)   . Hypokalemia   . Diabetes mellitus   . Depression   . Lumbar back pain   . LBBB (left bundle branch block)   . Headache   . Lymphadenitis   . Arthritis   . Bursitis   . Chronic anticoagulation   . Heart palpitations   . Diastolic dysfunction     per echo in October 2012 with EF 50 to 55%  . 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 GERD (gastroesophageal reflux disease) 10/23/2003    Past Surgical History  Procedure Date  . Angioplasty laminectomy  . Lumbar laminectomy   . Coronary artery bypass graft     LIMA to LAD   . Abdominal hysterectomy   . Cholecystectomy   . Tonsillectomy     History  Substance Use Topics  . Smoking status: Former Smoker    Quit date: 03/07/2001  . Smokeless tobacco: Never Used  . Alcohol Use: No    Family History  Problem Relation Age of Onset  . Heart attack Father   . Heart disease Father   . Kidney disease    . Stroke    . Arthritis    . Hypertension    . Diabetes    . Stroke Mother     Allergies  Allergen Reactions  . Cleocin (Clindamycin Hcl) Diarrhea  . Codeine Itching  . Doxycycline Hyclate Diarrhea  .  Morphine Hives  . Pentazocine Lactate Itching and Nausea And Vomiting  . Vibramycin (Doxycycline Calcium) Diarrhea  . Sulfonamide Derivatives Rash    Medication list has been reviewed and updated.  Outpatient Prescriptions Prior to Visit  Medication Sig Dispense Refill  . ALPRAZolam (XANAX) 1 MG tablet Take 1 tablet (1 mg total) by mouth 3 (three) times daily.  30 tablet  5  . aspirin 81 MG tablet Take 81 mg by mouth daily.        Marland Kitchen atorvastatin (LIPITOR) 20 MG tablet Take 40 mg by mouth at bedtime.       . cephALEXin (KEFLEX) 500 MG capsule Take 1 capsule (500 mg total) by mouth 3 (three) times daily.  21 capsule  0  . cyclobenzaprine (FLEXERIL) 10 MG tablet Take 10 mg by mouth 2 (two) times daily as needed. For muscle spasms      . diclofenac (FLECTOR) 1.3 % PTCH Place 1 patch onto the skin 2 (two)  times daily.      . digoxin (LANOXIN) 0.25 MG tablet Take 250 mcg by mouth daily.      . Exenatide (BYDUREON) 2 MG SUSR Inject 2 mg into the skin once a week. On mondays      . furosemide (LASIX) 20 MG tablet Take 1 tablet (20 mg total) by mouth daily.  7 tablet  0  . gabapentin (NEURONTIN) 300 MG capsule TAKE 1 CAPSULE THREE TIMES A DAY  90 capsule  2  . hydrochlorothiazide (MICROZIDE) 12.5 MG capsule TAKE ONE CAPSULE BY MOUTH EVERY DAY  90 capsule  0  . HYDROcodone-acetaminophen (NORCO) 10-325 MG per tablet TAKE 1 TABLET BY MOUTH EVERY 4 HOURS AS NEEDED  150 tablet  3  . insulin aspart (NOVOLOG FLEXPEN) 100 UNIT/ML injection Inject into the skin 3 times a day (just before each meal) 65-70-75 units.  75 mL  3  . Insulin Pen Needle 31G X 8 MM MISC Use as directed three times a day  300 each  2  . KLOR-CON M20 20 MEQ tablet TAKE 1 & 1/2 TABLETS BY MOUTH ONCE A DAY  45 tablet  5  . lansoprazole (PREVACID) 30 MG capsule Take 30 mg by mouth daily as needed. For heartburn      . metoprolol succinate (TOPROL-XL) 100 MG 24 hr tablet TAKE 1 TABLET BY MOUTH TWICE A DAY  60 tablet  6  . nitroGLYCERIN (NITROSTAT) 0.4 MG SL tablet Place 0.4 mg under the tongue every 5 (five) minutes as needed. For chest pain      . ONE TOUCH ULTRA TEST test strip       . spironolactone (ALDACTONE) 25 MG tablet Take 25 mg by mouth 2 (two) times daily.      Marland Kitchen telmisartan (MICARDIS) 40 MG tablet Take 40 mg by mouth daily.        Marland Kitchen warfarin (COUMADIN) 6 MG tablet Take 3-6 mg by mouth daily. 6mg  on mondays 3 mg all other days       Facility-Administered Medications Prior to Visit  Medication Dose Route Frequency Provider Last Rate Last Dose  . methylPREDNISolone acetate (DEPO-MEDROL) injection 40 mg  40 mg Intramuscular Once Stacie Glaze, MD       Last reviewed on 01/28/2012  9:22 AM by Jackson Latino, CMA  Review of Systems:   GEN: No fevers, chills. Nontoxic. Primarily MSK c/o today. MSK: Detailed in the HPI GI:  tolerating PO intake without difficulty Neuro: No numbness, parasthesias, or  tingling associated per her report today. Otherwise the pertinent positives of the ROS are noted above.    Physical Examination: Filed Vitals:   01/28/12 0921  BP: 130/80  Pulse: 82  Temp: 98.4 F (36.9 C)  TempSrc: Oral  Resp: 16  Weight: 241 lb (109.317 kg)    There is no height on file to calculate BMI. Ideal Body Weight:     GEN: WDWN, NAD, Non-toxic, Alert & Oriented x 3 HEENT: Atraumatic, Normocephalic.  Ears and Nose: No external deformity. EXTR: No clubbing/cyanosis/ trace LE edema NEURO: antalgic gait.  PSYCH: Normally interactive. Conversant. Not depressed or anxious appearing.  Calm demeanor.   FEET: L Echymosis: no Edema: no ROM: full LE B Gait: heel toe, mildly antalgic MT pain: minimal Callus pattern: none Lateral Mall: NT Medial Mall: NT Talus: NT Navicular: NT Cuboid: NT Calcaneous: NT Metatarsals: tender along the shafts of the 2nd and 3rd MT. Tender with squeeze maneuver and tender on plantar aspect, also, but to a lesser degree. 5th MT: NT Phalanges: NT Achilles: NT Plantar Fascia: NT Fat Pad: NT Peroneals: NT Post Tib: NT Great Toe: Nml motion Ant Drawer: neg ATFL: NT CFL: NT Deltoid: NT Hindfoot breakdown: none Sensation: intact   Hand R: mild swelling around 1st MCP, painful volar nodule and pronounced catching and triggering. Full ROM at wrist and all bony anatomy NT.  Assessment and Plan:  1. Foot pain   2. Stress reaction of bone   3. Trigger thumb of right hand    Clinical concern for 2nd and 3rd MT shaft stress reaction. Place in post-op shoe. None at Saturday clinic, so script written. Expect approx. 4 weeks to resolve. F/u 2 weeks with Dr. Lovell Sheehan.  Clinical suspicion high, < 50% diagnostic accuracy with plain film, and my suspicion for occult fx is extremely low.  As long as improving clinically at follow-up, treat as MT stress  reaction.  Keflex called in for patient, no sign of cellulitis or abscess, d/c ABX.  Classic trigger thumb.  Trigger Finger Injection, R 1st. Verbal consent was obtained. Risks (including rare risk of infection, potential risk for skin lightening and potential atrophy), benefits and alternatives were discussed. Prepped with Chloraprep and Ethyl Chloride used for anesthesia. Under sterile conditions, patient injected distal to the a1 pulley aiming distally with 45 degree angle towards nodule; injected directly into tendon sheath. Medication flowed freely without resistance.  Needle size: 22 gauge 1 1/2 inch Injection: 1/2 cc of Lidocaine 1% and 1/2 cc of Depo-Medrol 40 mg   Orders Today:  No orders of the defined types were placed in this encounter.    Updated Medication List: (Includes new medications, updates to list, dose adjustments) Meds ordered this encounter  Medications  . hydrocortisone 2.5 % cream    Sig:   . NONFORMULARY OR COMPOUNDED ITEM    Sig: Post-operative shoe, Left foot. LON 99 years Disp 1 Dx 733.95    Dispense:  1 each    Refill:  0    Medications Discontinued: Medications Discontinued During This Encounter  Medication Reason  . cephALEXin (KEFLEX) 500 MG capsule      Hannah Beat, MD

## 2012-01-30 ENCOUNTER — Telehealth: Payer: Self-pay

## 2012-01-31 ENCOUNTER — Ambulatory Visit: Payer: Medicare Other | Admitting: Physician Assistant

## 2012-01-31 NOTE — Telephone Encounter (Signed)
Pt was offered samples and she declined, pt states she wants the correct amount from her mail order company, she as enough medication for now, she will wait for your return next week to have letter sent to [facility]

## 2012-01-31 NOTE — Telephone Encounter (Signed)
It would be easier to just give pt samples of humalog and novolog pens

## 2012-02-03 ENCOUNTER — Other Ambulatory Visit: Payer: Self-pay | Admitting: Internal Medicine

## 2012-02-06 ENCOUNTER — Ambulatory Visit (INDEPENDENT_AMBULATORY_CARE_PROVIDER_SITE_OTHER): Payer: Medicare Other | Admitting: Family

## 2012-02-06 DIAGNOSIS — Z7901 Long term (current) use of anticoagulants: Secondary | ICD-10-CM

## 2012-02-06 DIAGNOSIS — I4891 Unspecified atrial fibrillation: Secondary | ICD-10-CM

## 2012-02-06 NOTE — Patient Instructions (Addendum)
Take an extra 1/2 tab today only. Then continue 6mg  on Monday and 3mg  all other days.     Latest dosing instructions   Total Glynis Smiles Tue Wed Thu Fri Sat   24 3 mg 6 mg 3 mg 3 mg 3 mg 3 mg 3 mg    (6 mg0.5) (6 mg1) (6 mg0.5) (6 mg0.5) (6 mg0.5) (6 mg0.5) (6 mg0.5)

## 2012-02-06 NOTE — Telephone Encounter (Signed)
i printed letter 

## 2012-02-06 NOTE — Telephone Encounter (Signed)
Letter mailed

## 2012-02-07 ENCOUNTER — Ambulatory Visit (INDEPENDENT_AMBULATORY_CARE_PROVIDER_SITE_OTHER): Payer: Medicare Other | Admitting: Physician Assistant

## 2012-02-07 ENCOUNTER — Encounter: Payer: Self-pay | Admitting: Physician Assistant

## 2012-02-07 ENCOUNTER — Telehealth: Payer: Self-pay | Admitting: *Deleted

## 2012-02-07 VITALS — BP 128/72 | HR 84 | Ht 62.0 in | Wt 241.0 lb

## 2012-02-07 DIAGNOSIS — K6289 Other specified diseases of anus and rectum: Secondary | ICD-10-CM | POA: Diagnosis not present

## 2012-02-07 DIAGNOSIS — K5900 Constipation, unspecified: Secondary | ICD-10-CM | POA: Diagnosis not present

## 2012-02-07 DIAGNOSIS — Z8673 Personal history of transient ischemic attack (TIA), and cerebral infarction without residual deficits: Secondary | ICD-10-CM

## 2012-02-07 MED ORDER — MOVIPREP 100 G PO SOLR: 1.0000 | Freq: Once | ORAL | Status: AC

## 2012-02-07 NOTE — Telephone Encounter (Signed)
  02/07/2012    RE: Lynessa Almanzar DOB: May 15, 1945 MRN: [id number]   Dear Shawnie Pons,    We have scheduled the above patient for an endoscopic procedure. Our records show that she is on anticoagulation therapy.   Please advise as to how long the patient may come off her therapy of Coumadin prior to the procedure, which is scheduled for 03-13-2012.  Please fax back/ or route the completed form to [facility] CMA at [phone].   Sincerely,    Ashby Dawes      Amy [place] PA

## 2012-02-07 NOTE — Patient Instructions (Addendum)
You have been scheduled for a colonoscopy with propofol. Please follow written instructions given to you at your visit today.  Please pick up your prep kit at the pharmacy within the next 1-3 days. If you use inhalers (even only as needed) or a CPAP machine, please bring them with you on the day of your procedure.  Later this week, your day of choice, Do 6 doses of Miralax ( 17 grams) in 8 oz of water or juice, 15 minutes apart with each dose.  We will call you once we hear from Dr Tedra Senegal regarding your coumadin.

## 2012-02-09 ENCOUNTER — Other Ambulatory Visit: Payer: Self-pay | Admitting: *Deleted

## 2012-02-09 ENCOUNTER — Encounter: Payer: Self-pay | Admitting: Physician Assistant

## 2012-02-09 DIAGNOSIS — Z8673 Personal history of transient ischemic attack (TIA), and cerebral infarction without residual deficits: Secondary | ICD-10-CM | POA: Insufficient documentation

## 2012-02-09 NOTE — Progress Notes (Signed)
Agree with initial assessment and plans. Amy to follow up regarding appropriate management of anticoagulation

## 2012-02-09 NOTE — Progress Notes (Signed)
Subjective:    Patient ID: Kristen [name], female    DOB: 1945-04-09, 66 y.o.   MRN: [id number]  HPI  Kristen [name] is a very nice 66 year old female known to Dr. Marina [name] remotely from endoscopy done in 2005. This was a normal exam in patient with GERD. Patient relates that she had a remote sigmoidoscopy per Dr. Lovell [name] but has not had colonoscopy She does have multiple medical problems including history of atrial fibrillation, remote CVA in 2004, coronary artery disease, obesity hypertension, and insulin-dependent diabetes mellitus. She is maintained on chronic Coumadin. She comes in today with complaints of inability to evacuate her rectum and intermittent fecal soilage over the past 2 months. She denies overt incontinence. She has had long-term problems with constipation but has just started noticing the soilage over the past couple of months. She says she has an urge for bowel movement but when she passes stool generally only passes a small amount and does not feel that she's evacuating well. She has a bowel movement about every other day has not noted any melena occasionally notes a tiny amount of bright red blood on the tissue. She says her stools are usually small caliber and hard. She has had some occasional right-sided abdominal discomfort recently as well. She has had very recent GYN evaluation to rule out a rectocele vomiting and was told by Dr. Lawson [name] ,that she did not have any of evidence of a rectocele.     Review of Systems  Constitutional: Negative.   HENT: Negative.   Eyes: Negative.   Respiratory: Negative.   Cardiovascular: Negative.   Gastrointestinal: Positive for abdominal pain, constipation and rectal pain.  Genitourinary: Negative.   Musculoskeletal: Negative.   Skin: Negative.   Neurological: Negative.   Hematological: Negative.   Psychiatric/Behavioral: Negative.    Outpatient Prescriptions Prior to Visit  Medication Sig Dispense Refill  . ALPRAZolam (XANAX) 1 MG  tablet Take 1 tablet (1 mg total) by mouth 3 (three) times daily.  30 tablet  5  . aspirin 81 MG tablet Take 81 mg by mouth daily.        Marland Kitchen atorvastatin (LIPITOR) 20 MG tablet Take 40 mg by mouth at bedtime.       . cyclobenzaprine (FLEXERIL) 10 MG tablet Take 10 mg by mouth 2 (two) times daily as needed. For muscle spasms      . digoxin (LANOXIN) 0.25 MG tablet Take 250 mcg by mouth daily.      . Exenatide (BYDUREON) 2 MG SUSR Inject 2 mg into the skin once a week. On mondays      . gabapentin (NEURONTIN) 300 MG capsule TAKE 1 CAPSULE THREE TIMES A DAY  90 capsule  2  . hydrochlorothiazide (MICROZIDE) 12.5 MG capsule TAKE ONE CAPSULE BY MOUTH EVERY DAY  90 capsule  0  . HYDROcodone-acetaminophen (NORCO) 10-325 MG per tablet TAKE 1 TABLET BY MOUTH EVERY 4 HOURS AS NEEDED  150 tablet  3  . hydrocortisone 2.5 % cream       . insulin aspart (NOVOLOG FLEXPEN) 100 UNIT/ML injection Inject into the skin 3 times a day (just before each meal) 65-70-75 units.  75 mL  3  . Insulin Pen Needle 31G X 8 MM MISC Use as directed three times a day  300 each  2  . lansoprazole (PREVACID) 30 MG capsule Take 30 mg by mouth daily as needed. For heartburn      . nitroGLYCERIN (NITROSTAT) 0.4 MG SL tablet Place 0.4 mg under the  tongue every 5 (five) minutes as needed. For chest pain      . ONE TOUCH ULTRA TEST test strip       . spironolactone (ALDACTONE) 25 MG tablet Take 25 mg by mouth 2 (two) times daily.      Marland Kitchen telmisartan (MICARDIS) 40 MG tablet Take 40 mg by mouth daily.        Marland Kitchen warfarin (COUMADIN) 6 MG tablet Take 3-6 mg by mouth daily. 6mg  on mondays 3 mg all other days      . warfarin (COUMADIN) 6 MG tablet TAKE AS DIRECTED BY COUMADIN CLINIC.  30 tablet  6  . diclofenac (FLECTOR) 1.3 % PTCH Place 1 patch onto the skin 2 (two) times daily.      . furosemide (LASIX) 20 MG tablet Take 1 tablet (20 mg total) by mouth daily.  7 tablet  0  . KLOR-CON M20 20 MEQ tablet TAKE 1 & 1/2 TABLETS BY MOUTH ONCE A DAY  45  tablet  5  . metoprolol succinate (TOPROL-XL) 100 MG 24 hr tablet TAKE 1 TABLET BY MOUTH TWICE A DAY  60 tablet  6  . NONFORMULARY OR COMPOUNDED ITEM Post-operative shoe, Left foot. LON 99 years Disp 1 Dx 733.95  1 each  0   Facility-Administered Medications Prior to Visit  Medication Dose Route Frequency Provider Last Rate Last Dose  . methylPREDNISolone acetate (DEPO-MEDROL) injection 40 mg  40 mg Intramuscular Once Stacie Glaze, MD       Allergies  Allergen Reactions  . Cleocin (Clindamycin Hcl) Diarrhea  . Codeine Itching  . Doxycycline Hyclate Diarrhea  . Morphine Hives  . Pentazocine Lactate Itching and Nausea And Vomiting  . Vibramycin (Doxycycline Calcium) Diarrhea  . Sulfonamide Derivatives Rash   Patient Active Problem List  Diagnosis  . DIAB W/PERIPH CIRC D/O TYPE I [JUV TYPE] UNCNTRL  . HYPERLIPIDEMIA NEC/NOS  . HYPOKALEMIA  . CHRONIC LYMPHADENITIS  . GRIEF REACTION, ACUTE  . DEPRESSION  . HYPERTENSION  . CORONARY ARTERY DISEASE  . LBBB  . ATRIAL FIBRILLATION  . GERD  . OSTEOARTHRITIS  . SHOULDER PAIN, LEFT  . KNEE PAIN, RIGHT  . CERVICAL RADICULOPATHY, LEFT  . BACK PAIN, LUMBAR, WITH RADICULOPATHY  . BURSITIS, ACROMIOCLAVICULAR, LEFT  . HEADACHE  . RIB PAIN, LEFT SIDED  . Long term current use of anticoagulant  . Fibromyalgia  . Bruising  . Unspecified hypothyroidism  . Chest pain, musculoskeletal  . Dizziness  . Sweating  . H/O: CVA (cerebrovascular accident)   History  Substance Use Topics  . Smoking status: Former Smoker    Quit date: 03/07/2001  . Smokeless tobacco: Never Used  . Alcohol Use: No      Family History  Problem Relation Age of Onset  . Heart attack Father   . Heart disease Father   . Kidney disease    . Stroke    . Arthritis    . Hypertension    . Diabetes    . Stroke Mother   . Colon cancer Paternal Uncle     Objective:   Physical Exam well-developed African-American female in no acute distress, pleasant. Blood  pressure 128/72 pulse 84 height 5 foot 2 weight 241. HEENT; nontraumatic normocephalic EOMI PERRLA sclera anicteric,Neck; Supple no JVD, Cardiovascular; regular rate and rhythm with S1-S2 no significant murmur rub or gallop, Pulmonary; clear bilaterally, Abdomen; soft , mild right lower quadrant tenderness no guarding no rebound no palpable mass or hepatosplenomegaly bowel sounds are active,  Rectal;l exam no external lesion noted sphincter tone is decreased, there is no impaction and on anoscopy she has a small internal hemorrhoid., Extremities no clubbing cyanosis or edema skin warm and dry, Psych mood and affect normal and appropriate        Assessment & Plan:  #35 66 year old female, on chronic Coumadin with worsening constipation, mild right lower quadrant  discomfort, and complaints of incomplete evacuation, and intermittent fecal soilage.  Recent GYN evaluation negative for rectocele, suspect pelvic floor laxity and sphincter laxity accounting for a component of her symptoms. Will need to rule out occult neoplasm  #2 chronic anticoagulation with history of remote CVA and atrial fibrillation #3 insulin-dependent diabetes mellitus #4 morbid obesity-BMI 44 #5 history of coronary artery disease; EF 50-55% on last echo 10 /2012 #6 chronic GERD #7 positive family history of colon cancer in a paternal uncle  Plan; we'll schedule for colonoscopy with Dr. Marina [name], procedure discussed in detail with the patient and she is agreeable to proceed Will obtain consent from Dr. Riley Kill for patient to stop Coumadin 5 days prior to her procedure, and expect she will need a Lovenox bridge which can be arranged through the [facility]. Patient will purge her bowel with MiraLax one day later this week, and is asked to take 6-7 doses over 2 hours vomiting help with her constipation in the short term. She may use MiraLax one or 2 doses daily thereafter.

## 2012-02-10 ENCOUNTER — Telehealth: Payer: Self-pay | Admitting: Family

## 2012-02-10 MED ORDER — ENOXAPARIN SODIUM 150 MG/ML ~~LOC~~ SOLN: 150.0000 mg | Freq: Two times a day (BID) | SUBCUTANEOUS | Status: DC

## 2012-02-10 NOTE — Telephone Encounter (Signed)
Pt states that because she is not having colonoscopy until Jan she would like instructions mailed to her. I advised pt that her Lovenox has been sent to pharmacy for her to pick up. Pt verbalized understanding and instructions mailed.

## 2012-02-10 NOTE — Telephone Encounter (Signed)
Please Advise patient to stop Coumadin 5 days prior to procedure. Start Lovenox once a day, but stop 24 hours prior to procedure. Resume Coumadin and Lovenox the evening of the procedure. Recheck INR 5 days post-procedure.

## 2012-02-11 NOTE — Telephone Encounter (Signed)
The patient has had prior stroke.  This was noted in prior admission quite a long time ago when LV was down.   Warfarin should be stopped five days prior to the procedure.  However, she should likely be bridged enoxaparin, with perhaps one dose per day for the two days prior to the procedure.  This will need to be arranged with the coumadin clinic.

## 2012-02-13 ENCOUNTER — Telehealth: Payer: Self-pay

## 2012-02-13 NOTE — Telephone Encounter (Signed)
Pt called stating [employer] never received the letter that Dr. Everardo All typed stating that pt did not receive all of her insulin, I advised patient that the letter was mailed to them over 1 week ago, pt was upset that he it has not been received and asked if I would fax the letter instead of mailing it to them.  I once again apologized to the patient and explained to her that I would fax it.

## 2012-02-14 NOTE — Telephone Encounter (Signed)
Letter faxed to [employer]

## 2012-02-16 NOTE — Telephone Encounter (Signed)
noted 

## 2012-02-21 ENCOUNTER — Other Ambulatory Visit: Payer: Self-pay | Admitting: Internal Medicine

## 2012-02-21 ENCOUNTER — Telehealth: Payer: Self-pay

## 2012-02-21 NOTE — Telephone Encounter (Signed)
noted 

## 2012-02-23 ENCOUNTER — Ambulatory Visit: Payer: Medicare Other | Admitting: Cardiology

## 2012-02-27 ENCOUNTER — Telehealth: Payer: Self-pay | Admitting: Internal Medicine

## 2012-02-27 ENCOUNTER — Telehealth: Payer: Self-pay | Admitting: Cardiology

## 2012-02-27 NOTE — Telephone Encounter (Signed)
I spoke with the pt and Friday night the pt was sitting at her computer and felt "swimmy headed for a second".  The pt checked her BP (203/118) and glucose (200 range).  The pt checked her BP every hour after that and her BP did go down but remained elevated. 170/90 range and pulse in the 90s.  Early this morning the pt checked her BP and said it was high so she took an extra Metoprolol Succinate 50mg  at 2 AM.  The pt then took her regular scheduled medications this morning and her BP was 128/70.  I instructed the pt to only check her BP 2 times per day and if she is symptomatic. I made the pt aware that if her BP is above 175/90 then she can take and extra HCTZ 12.5mg .  The pt agreed with plan.  The pt did contact her PCP about BP but he is not in the office. I will speak with Dr Riley Kill for further instructions.

## 2012-02-27 NOTE — Telephone Encounter (Signed)
[facility] Triage Call Report Triage Record Num: [id number] Operator: Di Kindle Patient Name: Kristen [name] Call Date & Time: 02/25/2012 5:00:52PM Patient Phone: [phone] PCP: Darryll Capers Patient Gender: Female PCP Fax : [phone] Patient DOB: 1945-07-24 [facility] Name: [facility]  Reason for Call: Arzu reports onset 02/24/12 of Dizziness lasted 30 seconds, BP wrist cuff was 203/118. Guideline: Hypertension, with edema left leg starts at shin to knee (not ankle), resting with leg elevated. BP now 164/81. Denies symptoms of concern, pain in left shoulder, edema of left arm continues. Takes Xanax for panic attacks, stress over loss of son 2010 from cancer. Patient requests appointment only with [name] for 02/27/12, advised not in office, states her medications are regulated by cardiologist, states she will call them 02/27/12.  Protocol(s) Used: Hypertension, Diagnosed or Suspected Recommended Outcome per Protocol: Provide Home/Self Care Reason for Outcome: Single elevated blood pressure reading and has questions

## 2012-02-27 NOTE — Telephone Encounter (Signed)
New Problem:    Patient called in because since Friday 02/24/12 patient has had fluctuating high BPs.  Patient took a pill and a half of her metoprolol succinate (TOPROL-XL) 100 MG 24 hr tablet and finally received a good reading of 128/70.  Patient is concerned about another stroke and would like to know how to proceed.  Please call back.

## 2012-02-28 NOTE — Telephone Encounter (Signed)
Per Dr Riley Kill the pt should schedule a follow-up appointment with Dr York Ram office for BP management.  Dr Riley Kill reviewed the pt's medication list and at this time he would rather have the pt take an extra Metoprolol Succinate 50mg  daily as needed for elevated BP instead of HCTZ.   I left this pt a message on her voicemail with instructions. I will forward this message to the triage nurse to contact the pt on 03/01/12 when the office reopens.

## 2012-03-01 NOTE — Telephone Encounter (Signed)
Pt. states that Lauren, Dr. Rosalyn Charters nurse, already gave her these instructions. She repeated instructions and states she understands.

## 2012-03-02 ENCOUNTER — Other Ambulatory Visit: Payer: Self-pay | Admitting: Internal Medicine

## 2012-03-05 ENCOUNTER — Encounter: Payer: Medicare Other | Admitting: Family

## 2012-03-08 ENCOUNTER — Ambulatory Visit (INDEPENDENT_AMBULATORY_CARE_PROVIDER_SITE_OTHER): Payer: Medicare Other | Admitting: Family

## 2012-03-08 VITALS — BP 142/80 | HR 72

## 2012-03-08 DIAGNOSIS — I4891 Unspecified atrial fibrillation: Secondary | ICD-10-CM

## 2012-03-08 DIAGNOSIS — Z7901 Long term (current) use of anticoagulants: Secondary | ICD-10-CM

## 2012-03-08 MED ORDER — FUROSEMIDE 40 MG PO TABS: 40.0000 mg | ORAL_TABLET | Freq: Every day | ORAL | Status: DC

## 2012-03-08 NOTE — Patient Instructions (Signed)
Continue 6mg on Monday and 3mg all other days. Recheck in 1 month.    Latest dosing instructions   Total Sun Mon Tue Wed Thu Fri Sat   24 3 mg 6 mg 3 mg 3 mg 3 mg 3 mg 3 mg    (6 mg0.5) (6 mg1) (6 mg0.5) (6 mg0.5) (6 mg0.5) (6 mg0.5) (6 mg0.5)        

## 2012-03-09 ENCOUNTER — Other Ambulatory Visit: Payer: Self-pay | Admitting: Cardiology

## 2012-03-09 ENCOUNTER — Other Ambulatory Visit: Payer: Self-pay | Admitting: Internal Medicine

## 2012-03-09 MED ORDER — METOPROLOL SUCCINATE ER 100 MG PO TB24: 100.0000 mg | ORAL_TABLET | Freq: Two times a day (BID) | ORAL | Status: DC

## 2012-03-09 NOTE — Telephone Encounter (Signed)
Pt will be out of med tomorrow

## 2012-03-13 ENCOUNTER — Encounter: Payer: Medicare Other | Admitting: Internal Medicine

## 2012-03-13 ENCOUNTER — Ambulatory Visit: Payer: Medicare Other | Admitting: Internal Medicine

## 2012-03-14 ENCOUNTER — Telehealth: Payer: Self-pay | Admitting: Internal Medicine

## 2012-03-14 NOTE — Telephone Encounter (Signed)
We don't have samples of Lovenox. She can have the procedure without the bridge but she is at an increased risk of clotting.

## 2012-03-14 NOTE — Telephone Encounter (Signed)
Pt can not afford lovenox rx cost 400.00. Pt would like samples or something else. [facility]

## 2012-03-14 NOTE — Telephone Encounter (Signed)
She needs to work with her PCP or cardiologist on this issue

## 2012-03-14 NOTE — Telephone Encounter (Signed)
Ok to give samples

## 2012-03-14 NOTE — Telephone Encounter (Signed)
Pt states that she cannot afford the lovenox prescription that she was given to take prior to her colon. Encouraged pt to call [facility] where the medication was prescribed and let them know that she is having trouble getting the lovenox. Instructed her to ask about possible samples or switching her to another blood thinner that may work better for her. Pt is to call our office back if she cannot get the medication. Dr. Marina Goodell aware.

## 2012-03-15 ENCOUNTER — Ambulatory Visit (INDEPENDENT_AMBULATORY_CARE_PROVIDER_SITE_OTHER): Payer: Medicare Other | Admitting: Internal Medicine

## 2012-03-15 ENCOUNTER — Encounter: Payer: Self-pay | Admitting: Internal Medicine

## 2012-03-15 VITALS — BP 130/80 | HR 76 | Temp 98.3°F | Resp 16 | Ht 62.0 in | Wt 241.0 lb

## 2012-03-15 DIAGNOSIS — F329 Major depressive disorder, single episode, unspecified: Secondary | ICD-10-CM

## 2012-03-15 DIAGNOSIS — E1059 Type 1 diabetes mellitus with other circulatory complications: Secondary | ICD-10-CM

## 2012-03-15 DIAGNOSIS — F4329 Adjustment disorder with other symptoms: Secondary | ICD-10-CM

## 2012-03-15 DIAGNOSIS — Z6841 Body Mass Index (BMI) 40.0 and over, adult: Secondary | ICD-10-CM

## 2012-03-15 DIAGNOSIS — M797 Fibromyalgia: Secondary | ICD-10-CM

## 2012-03-15 DIAGNOSIS — F4321 Adjustment disorder with depressed mood: Secondary | ICD-10-CM

## 2012-03-15 DIAGNOSIS — M25519 Pain in unspecified shoulder: Secondary | ICD-10-CM

## 2012-03-15 DIAGNOSIS — IMO0001 Reserved for inherently not codable concepts without codable children: Secondary | ICD-10-CM

## 2012-03-15 DIAGNOSIS — M25569 Pain in unspecified knee: Secondary | ICD-10-CM

## 2012-03-15 NOTE — Telephone Encounter (Signed)
error 

## 2012-03-15 NOTE — Telephone Encounter (Signed)
Pt pick up lovonex samples and tamesha will call pt tomorrow with instructions

## 2012-03-15 NOTE — Progress Notes (Signed)
Subjective:    Patient ID: Kristen [name], female    DOB: [birth year], 67 y.o.   MRN: [id number]  HPI Anniversary of loss of Grandson Food addiction  Depressed and eating due to depression Increased OA pain in feet    Review of Systems  Constitutional: Negative for activity change, appetite change and fatigue.  HENT: Negative for ear pain, congestion, neck pain, postnasal drip and sinus pressure.   Eyes: Negative for redness and visual disturbance.  Respiratory: Negative for cough, shortness of breath and wheezing.   Gastrointestinal: Negative for abdominal pain and abdominal distention.  Genitourinary: Negative for dysuria, frequency and menstrual problem.  Musculoskeletal: Negative for myalgias, joint swelling and arthralgias.  Skin: Negative for rash and wound.  Neurological: Negative for dizziness, weakness and headaches.  Hematological: Negative for adenopathy. Does not bruise/bleed easily.  Psychiatric/Behavioral: Negative for sleep disturbance and decreased concentration.   Past Medical History  Diagnosis Date  . CAD (coronary artery disease)   . PAF (paroxysmal atrial fibrillation)   . Hypertension   . Hyperlipidemia   . GERD (gastroesophageal reflux disease)   . Hypokalemia   . Diabetes mellitus   . Depression   . Lumbar back pain   . LBBB (left bundle branch block)   . Headache   . Lymphadenitis   . Arthritis   . Bursitis   . Chronic anticoagulation   . Heart palpitations   . Diastolic dysfunction     per echo in October 2012 with EF 50 to 55%  . 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 GERD (gastroesophageal reflux disease) 10/23/2003    History   Social History  . Marital Status: Legally Separated    Spouse Name: N/A    Number of Children: N/A  . Years of Education: N/A   Occupational History  . Not on file.   Social History Main Topics  . Smoking status: Former Smoker    Quit date: 03/07/2001  . Smokeless tobacco: Never Used  .  Alcohol Use: No  . Drug Use: No  . Sexually Active: Not on file   Other Topics Concern  . Not on file   Social History Narrative  . No narrative on file    Past Surgical History  Procedure Date  . Angioplasty laminectomy  . Lumbar laminectomy   . Coronary artery bypass graft     LIMA to LAD   . Abdominal hysterectomy   . Cholecystectomy   . Tonsillectomy     Family History  Problem Relation Age of Onset  . Heart attack Father   . Heart disease Father   . Kidney disease    . Stroke    . Arthritis    . Hypertension    . Diabetes    . Stroke Mother   . Colon cancer Paternal Uncle     Allergies  Allergen Reactions  . Cleocin (Clindamycin Hcl) Diarrhea  . Codeine Itching  . Doxycycline Hyclate Diarrhea  . Morphine Hives  . Pentazocine Lactate Itching and Nausea And Vomiting  . Vibramycin (Doxycycline Calcium) Diarrhea  . Sulfonamide Derivatives Rash    Current Outpatient Prescriptions on File Prior to Visit  Medication Sig Dispense Refill  . ALPRAZolam (XANAX) 1 MG tablet TAKE 1 TABLET 3 TIMES A DAY AS NEEDED FOR GRIEF  90 tablet  3  . aspirin 81 MG tablet Take 81 mg by mouth daily.        Marland Kitchen atorvastatin (LIPITOR) 20 MG tablet Take 40  mg by mouth at bedtime.       . cyclobenzaprine (FLEXERIL) 10 MG tablet Take 10 mg by mouth 2 (two) times daily as needed. For muscle spasms      . digoxin (LANOXIN) 0.25 MG tablet TAKE 1 TABLET EVERY DAY  90 tablet  2  . enoxaparin (LOVENOX) 150 MG/ML injection Inject 1 mL (150 mg total) into the skin every 12 (twelve) hours.  15 Syringe  0  . Exenatide (BYDUREON) 2 MG SUSR Inject 2 mg into the skin once a week. On mondays      . furosemide (LASIX) 40 MG tablet Take 1 tablet (40 mg total) by mouth daily.  30 tablet  3  . gabapentin (NEURONTIN) 300 MG capsule TAKE 1 CAPSULE THREE TIMES A DAY  90 capsule  2  . hydrochlorothiazide (MICROZIDE) 12.5 MG capsule TAKE ONE CAPSULE BY MOUTH EVERY DAY  90 capsule  0  .  HYDROcodone-acetaminophen (NORCO) 10-325 MG per tablet TAKE 1 TABLET BY MOUTH EVERY 4 HOURS AS NEEDED  150 tablet  3  . hydrocortisone 2.5 % cream       . insulin aspart (NOVOLOG FLEXPEN) 100 UNIT/ML injection Inject into the skin 3 times a day (just before each meal) 65-70-75 units.  75 mL  3  . Insulin Pen Needle 31G X 8 MM MISC Use as directed three times a day  300 each  2  . KLOR-CON M20 20 MEQ tablet TAKE 1 & 1/2 TABLETS BY MOUTH ONCE A DAY  45 tablet  5  . lansoprazole (PREVACID) 30 MG capsule Take 30 mg by mouth daily as needed. For heartburn      . metoprolol succinate (TOPROL-XL) 100 MG 24 hr tablet Take 1 tablet (100 mg total) by mouth 2 (two) times daily. Take with or immediately following a meal.  60 tablet  6  . nitroGLYCERIN (NITROSTAT) 0.4 MG SL tablet Place 0.4 mg under the tongue every 5 (five) minutes as needed. For chest pain      . NONFORMULARY OR COMPOUNDED ITEM Post-operative shoe, Left foot. LON 99 years Disp 1 Dx 733.95  1 each  0  . ONE TOUCH ULTRA TEST test strip       . spironolactone (ALDACTONE) 25 MG tablet Take 25 mg by mouth 2 (two) times daily.      Marland Kitchen telmisartan (MICARDIS) 40 MG tablet Take 40 mg by mouth daily.        Marland Kitchen warfarin (COUMADIN) 6 MG tablet Take 3-6 mg by mouth daily. 6mg  on mondays 3 mg all other days       Current Facility-Administered Medications on File Prior to Visit  Medication Dose Route Frequency Provider Last Rate Last Dose  . methylPREDNISolone acetate (DEPO-MEDROL) injection 40 mg  40 mg Intramuscular Once Stacie Glaze, MD        BP 130/80  Pulse 76  Temp 98.3 F (36.8 C)  Resp 16  Ht 5\' 2"  (1.575 m)  Wt 241 lb (109.317 kg)  BMI 44.08 kg/m2        Objective:   Physical Exam  Nursing note and vitals reviewed. Constitutional: She is oriented to person, place, and time. She appears well-developed and well-nourished. No distress.  HENT:  Head: Normocephalic and atraumatic.  Mouth/Throat: Oropharynx is clear and moist.    Eyes: Conjunctivae normal and EOM are normal. Pupils are equal, round, and reactive to light.  Neck: Normal range of motion. Neck supple. No JVD present. No tracheal deviation  present. No thyromegaly present.  Cardiovascular: Normal rate, regular rhythm, normal heart sounds and intact distal pulses.   No murmur heard. Pulmonary/Chest: Effort normal and breath sounds normal. She has no wheezes. She exhibits no tenderness.  Abdominal: Soft. Bowel sounds are normal. She exhibits distension.       Rectal skin irritated  Musculoskeletal: She exhibits edema and tenderness.  Lymphadenopathy:    She has no cervical adenopathy.  Neurological: She is alert and oriented to person, place, and time. She has normal reflexes. No cranial nerve deficit.  Skin: Skin is warm and dry. Rash noted. She is not diaphoretic. There is erythema. There is pallor.  Psychiatric: She has a normal mood and affect. Her behavior is normal.   MSK tenderness in 9/9 pairs of joints Functioning severely limited Meets severe fibromyalgia exam criteria        Assessment & Plan:  Morbid obesity and depression with progressive DJD Pain and presentation is complicated by multifactorial grief and depression presentation.  She has multiple multiple musculoskeletal complaints hand pain shoulder pain prominent at the biceps tendon and pectoralis muscles on the left chest wall This is reproducible to palpation I do not believe this represents CAD We'll try a new serotonin and norepinephrine antidepressant class that should help with the pain weight control and anxiety  Trial of Fetzima  20 to 40 two months moniter effect with obesity and pain Panic and prolong grief MSK pain in left shoulder reproducible with exam

## 2012-03-15 NOTE — Telephone Encounter (Signed)
Pt given 8 lovenox inj. 80mg  each. Instructions?

## 2012-03-16 ENCOUNTER — Telehealth: Payer: Self-pay | Admitting: Internal Medicine

## 2012-03-16 ENCOUNTER — Ambulatory Visit (INDEPENDENT_AMBULATORY_CARE_PROVIDER_SITE_OTHER): Payer: Medicare Other | Admitting: Cardiology

## 2012-03-16 VITALS — BP 122/67 | HR 72 | Ht 62.0 in | Wt 241.0 lb

## 2012-03-16 DIAGNOSIS — Z7901 Long term (current) use of anticoagulants: Secondary | ICD-10-CM

## 2012-03-16 DIAGNOSIS — Z8673 Personal history of transient ischemic attack (TIA), and cerebral infarction without residual deficits: Secondary | ICD-10-CM

## 2012-03-16 DIAGNOSIS — F329 Major depressive disorder, single episode, unspecified: Secondary | ICD-10-CM

## 2012-03-16 DIAGNOSIS — E785 Hyperlipidemia, unspecified: Secondary | ICD-10-CM

## 2012-03-16 DIAGNOSIS — I2510 Atherosclerotic heart disease of native coronary artery without angina pectoris: Secondary | ICD-10-CM

## 2012-03-16 DIAGNOSIS — I4891 Unspecified atrial fibrillation: Secondary | ICD-10-CM

## 2012-03-16 LAB — BASIC METABOLIC PANEL
CO2: 26 mEq/L (ref 19–32)
Calcium: 9.9 mg/dL (ref 8.4–10.5)
Creat: 0.78 mg/dL (ref 0.50–1.10)
Glucose, Bld: 122 mg/dL — ABNORMAL HIGH (ref 70–99)

## 2012-03-16 NOTE — Telephone Encounter (Signed)
8 injections is not enough. At the dose, she would need injections twice a day. She would stop coumadin 4 days prior to procedure, start Lovenox twice a day. Stop Lovenox 24 hours prior to procedure. Resume Coumadin and Lovenox the same evening unless directed otherwise by GI. Recheck INR 3-5 days post procedure.   If she cannot afford the injections and accept the risk (potentially developing a clot that can results in DVT, PE, stroke of heart attack) she can elect to not take Lovenox but we will have to give her more coumadin post-procedure.

## 2012-03-16 NOTE — Telephone Encounter (Signed)
Pt saw Dr. Riley Kill today and states she can stay on her coumadin for the procedure, she just could not have a biopsy done. Pt states she cannot afford the Lovenox. Dr. Marina Goodell are you ok with this? Please advise.

## 2012-03-16 NOTE — Progress Notes (Signed)
HPI:  Patient seen in followup. From a cardiac standpoint overall she is doing pretty well. We covered several issues today. Her legs seem to bother her quite a bit. She thinks it might be related to the Lipitor. In addition, she is scheduled to have a colonoscopy. From a financial standpoint getting Lovenox as a restrained for her. She asked about stopping Coumadin. Unfortunately, the patient's had a prior stroke which led to being put on Coumadin in the first place. As a result, I think her risks are increased for coming off of Coumadin for any type of procedure. Preferably, we would recommend that she have this done on Coumadin, and/or with a Lovenox bridge clearly up into the time of the procedure. She would prefer the former, given the cost of Lovenox. If she would have this, and potentially she would need to have a repeat procedure for major biopsy was needed. An alternative would be virtual colonoscopy. This will need to be deferred to the GI service.  Current Outpatient Prescriptions  Medication Sig Dispense Refill  . ALPRAZolam (XANAX) 1 MG tablet TAKE 1 TABLET 3 TIMES A DAY AS NEEDED FOR GRIEF  90 tablet  3  . aspirin 81 MG tablet Take 81 mg by mouth daily.        Marland Kitchen atorvastatin (LIPITOR) 20 MG tablet Take 40 mg by mouth at bedtime.       . cyclobenzaprine (FLEXERIL) 10 MG tablet Take 10 mg by mouth 2 (two) times daily as needed. For muscle spasms      . digoxin (LANOXIN) 0.25 MG tablet TAKE 1 TABLET EVERY DAY  90 tablet  2  . enoxaparin (LOVENOX) 150 MG/ML injection Inject 1 mL (150 mg total) into the skin every 12 (twelve) hours.  15 Syringe  0  . Exenatide (BYDUREON) 2 MG SUSR Inject 2 mg into the skin once a week. On mondays      . furosemide (LASIX) 40 MG tablet Take 1 tablet (40 mg total) by mouth daily.  30 tablet  3  . gabapentin (NEURONTIN) 300 MG capsule TAKE 1 CAPSULE THREE TIMES A DAY  90 capsule  2  . hydrochlorothiazide (MICROZIDE) 12.5 MG capsule TAKE ONE CAPSULE BY MOUTH  EVERY DAY  90 capsule  0  . HYDROcodone-acetaminophen (NORCO) 10-325 MG per tablet TAKE 1 TABLET BY MOUTH EVERY 4 HOURS AS NEEDED  150 tablet  3  . hydrocortisone 2.5 % cream       . insulin aspart (NOVOLOG FLEXPEN) 100 UNIT/ML injection Inject into the skin 3 times a day (just before each meal) 65-70-75 units.  75 mL  3  . Insulin Pen Needle 31G X 8 MM MISC Use as directed three times a day  300 each  2  . KLOR-CON M20 20 MEQ tablet TAKE 1 & 1/2 TABLETS BY MOUTH ONCE A DAY  45 tablet  5  . lansoprazole (PREVACID) 30 MG capsule Take 30 mg by mouth daily as needed. For heartburn      . metoprolol succinate (TOPROL-XL) 100 MG 24 hr tablet Take 1 tablet (100 mg total) by mouth 2 (two) times daily. Take with or immediately following a meal.  60 tablet  6  . nitroGLYCERIN (NITROSTAT) 0.4 MG SL tablet Place 0.4 mg under the tongue every 5 (five) minutes as needed. For chest pain      . NONFORMULARY OR COMPOUNDED ITEM Post-operative shoe, Left foot. LON 99 years Disp 1 Dx 733.95  1 each  0  .  ONE TOUCH ULTRA TEST test strip       . spironolactone (ALDACTONE) 25 MG tablet Take 25 mg by mouth 2 (two) times daily.      Marland Kitchen telmisartan (MICARDIS) 40 MG tablet Take 40 mg by mouth daily.        Marland Kitchen warfarin (COUMADIN) 6 MG tablet Take 3-6 mg by mouth daily. 6mg  on mondays 3 mg all other days       Current Facility-Administered Medications  Medication Dose Route Frequency Provider Last Rate Last Dose  . methylPREDNISolone acetate (DEPO-MEDROL) injection 40 mg  40 mg Intramuscular Once Stacie Glaze, MD        Allergies  Allergen Reactions  . Cleocin (Clindamycin Hcl) Diarrhea  . Codeine Itching  . Doxycycline Hyclate Diarrhea  . Morphine Hives  . Pentazocine Lactate Itching and Nausea And Vomiting  . Vibramycin (Doxycycline Calcium) Diarrhea  . Sulfonamide Derivatives Rash    Past Medical History  Diagnosis Date  . CAD (coronary artery disease)   . PAF (paroxysmal atrial fibrillation)   .  Hypertension   . Hyperlipidemia   . GERD (gastroesophageal reflux disease)   . Hypokalemia   . Diabetes mellitus   . Depression   . Lumbar back pain   . LBBB (left bundle branch block)   . Headache   . Lymphadenitis   . Arthritis   . Bursitis   . Chronic anticoagulation   . Heart palpitations   . Diastolic dysfunction     per echo in October 2012 with EF 50 to 55%  . 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 GERD (gastroesophageal reflux disease) 10/23/2003    Past Surgical History  Procedure Date  . Angioplasty laminectomy  . Lumbar laminectomy   . Coronary artery bypass graft     LIMA to LAD   . Abdominal hysterectomy   . Cholecystectomy   . Tonsillectomy     Family History  Problem Relation Age of Onset  . Heart attack Father   . Heart disease Father   . Kidney disease    . Stroke    . Arthritis    . Hypertension    . Diabetes    . Stroke Mother   . Colon cancer Paternal Uncle     History   Social History  . Marital Status: Legally Separated    Spouse Name: N/A    Number of Children: N/A  . Years of Education: N/A   Occupational History  . Not on file.   Social History Main Topics  . Smoking status: Former Smoker    Quit date: 03/07/2001  . Smokeless tobacco: Never Used  . Alcohol Use: No  . Drug Use: No  . Sexually Active: Not on file   Other Topics Concern  . Not on file   Social History Narrative  . No narrative on file    ROS: Please see the HPI.  All other systems reviewed and negative.  PHYSICAL EXAM:  BP 122/67  Pulse 72  Ht 5\' 2"  (1.575 m)  Wt 241 lb (109.317 kg)  BMI 44.08 kg/m2  General: Well developed, well nourished, in no acute distress. Head:  Normocephalic and atraumatic. Neck: no JVD Lungs: Clear to auscultation and percussion. Heart: Normal S1 and paradoxic S2 yPulses: Pulses normal in all 4 extremities. Extremities: No clubbing or cyanosis. No edema. Neurologic: Alert and oriented x 3.  EKG:  NSR.   LBBB ASSESSMENT AND PLAN:

## 2012-03-16 NOTE — Telephone Encounter (Signed)
Pt aware and verbalized understanding but did not tell me what is choosing to do. She says that she will call back after her appt with [facility]

## 2012-03-16 NOTE — Assessment & Plan Note (Signed)
No new symptoms.

## 2012-03-16 NOTE — Telephone Encounter (Signed)
Pt states that she chooses not to run the risk of clotting and will try to get assistance from an outside source. She said she will delay the colonoscopy if she has to

## 2012-03-16 NOTE — Assessment & Plan Note (Signed)
Thinks the lipitor is bothering her.  Will try a holiday.

## 2012-03-16 NOTE — Assessment & Plan Note (Signed)
Seems to worse in January related to her grandson's death.

## 2012-03-16 NOTE — Patient Instructions (Signed)
Follow-up with Dr. Riley Kill the end of March  Continue taking your medications as directed  Lab work today: BMP, Mg.  We will call you with your results

## 2012-03-16 NOTE — Assessment & Plan Note (Signed)
Started with prior stroke.

## 2012-03-16 NOTE — Assessment & Plan Note (Signed)
Has prior CVA.  Remains on warfarin. Risk of dc of warfarin is increased.  From the standpoint of her colon exam, she says she cannot afford lovenox.  ? Should be done on warfarin, then repeated only if she needs a biopsy vs. Virtual colonoscopy.  Would defer to GI.

## 2012-03-17 NOTE — Telephone Encounter (Signed)
Given her clear understanding of the issues and her preference, we can perform colonoscopy on coumadin (routine bx and small polypectomies, not large, can be performed). Ask her to have her INR checked 2 days prior to the procedure. Bonita Quin, follow up on the lab result that day and forward to me. Thanks

## 2012-03-19 ENCOUNTER — Other Ambulatory Visit (INDEPENDENT_AMBULATORY_CARE_PROVIDER_SITE_OTHER): Payer: Medicare Other

## 2012-03-19 ENCOUNTER — Ambulatory Visit: Payer: Medicare Other | Admitting: Internal Medicine

## 2012-03-19 ENCOUNTER — Telehealth: Payer: Self-pay | Admitting: Internal Medicine

## 2012-03-19 DIAGNOSIS — Z7901 Long term (current) use of anticoagulants: Secondary | ICD-10-CM

## 2012-03-19 MED ORDER — PEG-KCL-NACL-NASULF-NA ASC-C 100 G PO SOLR: ORAL | Status: DC

## 2012-03-19 NOTE — Telephone Encounter (Signed)
Per Dr. Marina Goodell, INR is fine. Keep procedure as scheduled.

## 2012-03-19 NOTE — Telephone Encounter (Signed)
Spoke with patient and she will come today for INR. She did not get her Moviprep because it "cost too much." Sample up front for pick up. Reviewed with patient all procedure instructions and corrected her time for prepping(she changed her procedure date since she received instruction)

## 2012-03-19 NOTE — Telephone Encounter (Signed)
Left a message for patient to call me at home and cell number. 

## 2012-03-19 NOTE — Telephone Encounter (Signed)
Already discussed with patient see previous note

## 2012-03-20 ENCOUNTER — Encounter: Payer: Self-pay | Admitting: Internal Medicine

## 2012-03-20 ENCOUNTER — Ambulatory Visit (AMBULATORY_SURGERY_CENTER): Payer: Medicare Other | Admitting: Internal Medicine

## 2012-03-20 VITALS — BP 104/48 | HR 68 | Temp 98.7°F | Resp 19 | Ht 62.0 in | Wt 242.0 lb

## 2012-03-20 DIAGNOSIS — D126 Benign neoplasm of colon, unspecified: Secondary | ICD-10-CM

## 2012-03-20 DIAGNOSIS — K5900 Constipation, unspecified: Secondary | ICD-10-CM

## 2012-03-20 DIAGNOSIS — Z1211 Encounter for screening for malignant neoplasm of colon: Secondary | ICD-10-CM

## 2012-03-20 DIAGNOSIS — K6289 Other specified diseases of anus and rectum: Secondary | ICD-10-CM

## 2012-03-20 LAB — HM COLONOSCOPY: HM Colonoscopy: NORMAL

## 2012-03-20 LAB — GLUCOSE, CAPILLARY: Glucose-Capillary: 144 mg/dL — ABNORMAL HIGH (ref 70–99)

## 2012-03-20 MED ORDER — SODIUM CHLORIDE 0.9 % IV SOLN: 500.0000 mL | INTRAVENOUS | Status: DC

## 2012-03-20 NOTE — Progress Notes (Addendum)
No egg or soy allergy. ewm  Iv 1 attempt right Ac and 2 right arm by s [facility]ges rn, both unsuccessful. ewm

## 2012-03-20 NOTE — Progress Notes (Signed)
Propofol given over incremental dosagesLidocaine-40mg IV prior to Propofol Induction 

## 2012-03-20 NOTE — Progress Notes (Signed)
Patient did not experience any of the following events: a burn prior to discharge; a fall within the facility; wrong site/side/patient/procedure/implant event; or a hospital transfer or hospital admission upon discharge from the facility. (G8907) Patient did not have preoperative order for IV antibiotic SSI prophylaxis. (G8918)  

## 2012-03-20 NOTE — Progress Notes (Signed)
Lidocaine-40mg  IV prior to Propofol Induction

## 2012-03-20 NOTE — Op Note (Signed)
[facility] [address] [place], [address]   COLONOSCOPY PROCEDURE REPORT  PATIENT: Kristen [name], Kristen [name]  MR#: [id number] BIRTHDATE: 07-Dec-1945 , 66  yrs. old GENDER: Female ENDOSCOPIST: Roxy Cedar, MD REFERRED BY:.  Self / Office PROCEDURE DATE:  03/20/2012 PROCEDURE:   Colonoscopy with snare polypectomy    x 2 ASA CLASS:   Class III INDICATIONS:average risk screening.   Other complaints (see recent OV) MEDICATIONS: MAC sedation, administered by CRNA and propofol (Diprivan) 150mg  IV  DESCRIPTION OF PROCEDURE:   After the risks benefits and alternatives of the procedure were thoroughly explained, informed consent was obtained.  A digital rectal exam revealed decreased sphincter tone and A digital rectal exam revealed a fungal rash. The LB [id number]  endoscope was introduced through the anus and advanced to the cecum, which was identified by both the appendix and ileocecal valve. No adverse events experienced.   The quality of the prep was adequate, using MoviPrep  The instrument was then slowly withdrawn as the colon was fully examined.      COLON FINDINGS: Two diminutive polyps were found in the descending colon and rectum.  A polypectomy was performed with a cold snare. The resection was complete and the polyp tissue was completely retrieved.   The colon was otherwise normal.  There was no diverticulosis, inflammation, other polyps or cancers . Retroflexed views revealed internal hemorrhoids. The time to cecum=7 minutes 22 seconds.  Withdrawal time=13 minutes 30 seconds. The scope was withdrawn and the procedure completed. COMPLICATIONS: There were no complications.  ENDOSCOPIC IMPRESSION: 1.   Two diminutive polyps were found in the descending colon and rectum; polypectomy was performed with a cold snare 2.   The colon was otherwise normal  RECOMMENDATIONS: 1.  Daily fiber - metamucil 2 tablespoons 2. Try Lamisil cream (pick up  OTC) for perirectal rash - if that does not help, see your PCP or a dermatologist 2.  Repeat colonoscopy in 5 years if polyp adenomatous; otherwise 10 years   eSigned:  Roxy Cedar, MD 03/20/2012 10:38 AM   cc: Stacie Glaze, MD and The Patient   PATIENT NAME:  Kristen [name], Kristen [name] MR#: [id number]

## 2012-03-20 NOTE — Patient Instructions (Addendum)

## 2012-03-21 ENCOUNTER — Telehealth: Payer: Self-pay | Admitting: *Deleted

## 2012-03-21 NOTE — Telephone Encounter (Signed)
  Follow up Call-  Call back number 03/20/2012  Post procedure Call Back phone  # [phone]  Permission to leave phone message Yes     Patient questions:  Do you have a fever, pain , or abdominal swelling? no Pain Score  0 *  Have you tolerated food without any problems? yes  Have you been able to return to your normal activities? yes  Do you have any questions about your discharge instructions: Diet   no Medications  no Follow up visit  no  Do you have questions or concerns about your Care? No   Actions: * If pain score is 4 or above: No action needed, pain <4.  "Doing great today"

## 2012-03-22 ENCOUNTER — Telehealth: Payer: Self-pay | Admitting: Cardiology

## 2012-03-22 NOTE — Telephone Encounter (Signed)
Pt notified of lab results

## 2012-03-22 NOTE — Telephone Encounter (Signed)
Pt would like her lab results °

## 2012-03-26 ENCOUNTER — Encounter: Payer: Self-pay | Admitting: Internal Medicine

## 2012-03-26 ENCOUNTER — Telehealth: Payer: Self-pay | Admitting: Cardiology

## 2012-03-26 ENCOUNTER — Telehealth: Payer: Self-pay | Admitting: Internal Medicine

## 2012-03-26 DIAGNOSIS — R600 Localized edema: Secondary | ICD-10-CM

## 2012-03-26 NOTE — Telephone Encounter (Signed)
Patient Information:  Caller Name: Meital  Phone: [phone]  Patient: Kristen [name], Kristen [name]  Gender: Female  DOB: Jul 13, 1945  Age: 67 Years  PCP: Darryll Capers (Adults only)  Office Follow Up:  Does the office need to follow up with this patient?: Yes  Instructions For The Office: PT IS WORRIED THAT SHE WILL GO TO THE ER AND SPEND ALL DAY AND NOTHING WILL BE DONE.  OFFICE PLEASE FOLLOW UP IF PT CAN BE SEEN IN THE OFFICE OR IF OFFICE AGREES THAT SHE SHOULD BE SEEN IN THE ED   Symptoms  Reason For Call & Symptoms: caller reports that she has been retaining fluid in her left lower leg.  Pt states on 03/25/12 she noticed the swelling had moved up to knee and thigh. Pt has taken 1/2 tablet of the lasix 40 mg tablet (Cardioloigist had told patient to be careful taking due to potassium level).  Pt is worried about clot, injury, etc.  Reviewed Health History In EMR: Yes  Reviewed Medications In EMR: Yes  Reviewed Allergies In EMR: Yes  Reviewed Surgeries / Procedures: Yes  Date of Onset of Symptoms: 03/25/2012  Guideline(s) Used:  Leg Swelling and Edema  Disposition Per Guideline:   Go to ED Now (or to Office with PCP Approval)  Reason For Disposition Reached:   Severe swelling (e.g., swelling extends above knee, entire leg is swollen, weeping fluid)  Advice Given:  Call Back If:  You become worse.

## 2012-03-26 NOTE — Telephone Encounter (Signed)
dopper per [name] and sh e needs to wear compression hose

## 2012-03-26 NOTE — Telephone Encounter (Signed)
I spoke with the pt and she is having a LEV in the morning and this was ordered by [name].  I spoke with the pt about her lasix and she has only been taking 20mg  every third day.  The pt is very anxious about taking a higher dose of Furosemide and having her potassium drop.  I made the pt aware to keep her doppler as scheduled and we can make further recommendations about her diuretic after that test.

## 2012-03-26 NOTE — Telephone Encounter (Signed)
Pt calling re knee swelling, another dr gave her rx for furosimide, dr Riley Kill told her to be careful wit it and not take it every day, weight gain 3 1/2 lbs since last week, pls advise [phone] or [phone]

## 2012-03-26 NOTE — Telephone Encounter (Signed)
appt scheduled for 9 am tomorrow, pt aware.  She started crying on the phone wondering if it could wait for tomorrow.  Told pt that [name] said he felt like it could wait till tomorrow but if she felt it is urgent or she develops pain, redness and swelling she needed to go the ER.  Pt stated she has an anxiety and she trust [name] but she verbalized understanding and had no questions.

## 2012-03-27 ENCOUNTER — Encounter (INDEPENDENT_AMBULATORY_CARE_PROVIDER_SITE_OTHER): Payer: Medicare Other

## 2012-03-27 ENCOUNTER — Telehealth: Payer: Self-pay | Admitting: Internal Medicine

## 2012-03-27 DIAGNOSIS — R609 Edema, unspecified: Secondary | ICD-10-CM

## 2012-03-27 DIAGNOSIS — M79609 Pain in unspecified limb: Secondary | ICD-10-CM

## 2012-03-27 NOTE — Telephone Encounter (Signed)
Spoke with pt and she knows to contact her PCP or a dermatologist per Dr. Lamar Sprinkles recommendation.

## 2012-03-27 NOTE — Telephone Encounter (Signed)
Left message for pt to call back. Per colon report pt was to see her PCP or dermatologist if no improvement with cream that was recommended.

## 2012-03-28 ENCOUNTER — Telehealth: Payer: Self-pay | Admitting: Internal Medicine

## 2012-03-28 NOTE — Telephone Encounter (Signed)
Patient initiated call to office regarding possible reaction to furosemide.  Noticed that she was puffy around the face yesterday with a hive noted as well.  Is not taking 40 mg daily but only 20 mg daily.  Nurse from Dr. Rosalyn Charters office called regarding test results and answered her question about whether or not this was an allergic reaction to this diuretic.  Patient just wants to make Dr. Lovell Sheehan aware that she will try it again today and see if she gets the same reaction and notify tomorrow the outcome.  Please note that she is also on 2 other diuretics.  Patient denied need for assessment at this time.

## 2012-03-28 NOTE — Telephone Encounter (Signed)
I spoke with the pt and made her aware that Lower extremity doppler did not show a thrombus. The pt feels like her edema has improved a little since taking Furosemide 20mg  on Monday and Tuesday. The pt did notice that a very fine rash broke out on her face the last 2 days but did resolve.  The pt is wondering if the rash is related to furosemide. The pt is going to take furosemide again today and see if she develops a rash. I made the pt aware that if she continues taking furosemide she will need close follow-up of BMP. The pt plans to get compression stockings next week ([name] ordered). I made the pt aware that she needs to limit salt intake and elevate her feet when possible.  Pt agreed with plan and will call the office with further questions.

## 2012-03-29 ENCOUNTER — Other Ambulatory Visit: Payer: Self-pay | Admitting: Internal Medicine

## 2012-03-30 ENCOUNTER — Ambulatory Visit: Payer: Medicare Other | Admitting: Endocrinology

## 2012-03-30 ENCOUNTER — Telehealth: Payer: Self-pay | Admitting: Internal Medicine

## 2012-03-30 NOTE — Telephone Encounter (Signed)
Patient Information:  Caller Name: Julene  Phone: [phone]  Patient: Kristen [name]  Gender: Female  DOB: 16-Jan-1946  Age: 67 Years  PCP: Darryll Capers (Adults only)  Office Follow Up:  Does the office need to follow up with this patient?: Yes  Instructions For The Office: She cannot make an appt for today. She has no ride.  PLEASE CONTACT PATIENT - [id number].  PHARMACY -[facility].  RN Note:  She cannot make an appt for today. She has no ride.  PLEASE CONTACT PATIENT - [phone].  PHARMACY -[facility].  Symptoms  Reason For Call & Symptoms: Patient states she recently had a Colonoscopy- she had a perirectal rash and she was using Lamasil for the rash. She was told if this did not clear up she should see her PCP or dermatologist.  She states she is continuing to itch. It is moving toward the vaginal area.  "Intense itching".  Skin is irritated.  She has had a vaginal work up and colonoscopy work up.  Reviewed Health History In EMR: No  Reviewed Medications In EMR: No  Reviewed Allergies In EMR: No  Reviewed Surgeries / Procedures: Yes  Date of Onset of Symptoms: 02/03/2012  Treatments Tried: Lamasil, Hydrocortisone Cream  Treatments Tried Worked: No  Guideline(s) Used:  Rectal Symptoms  Disposition Per Guideline:   See Today or Tomorrow in Office  Reason For Disposition Reached:   Home treatment for > 3 days for rectal itching and not improved  Advice Given:  Treatment of Mild Rectal Pain:  Warm SITZ Bath Twice a Day - Sit in a warm saline bath for 20 minutes bid to cleanse the area and to promote healing. Add 2 ounces (57 grams) of table salt or baking soda to each tub of water. Afterwards, gently pat area dry with unscented toilet paper.  Topical Hydrocortisone Twice a Day - After taking a Sitz bath and drying your rectal area, apply 1% hydrocortisone ointment (OTC) bid to reduce irritation. Hydrocortisone is found in various OTC hemorrhoid  medications (e.g., Anusol HC, Preparation H Hydrocortisone, Analpram HC Cream).  Treatment of Mild Rectal Itching:  The main treatment for rectal itching is keeping the rectal area clean and dry, and avoiding excessive rubbing or scratching. Loose cotton underwear helps keep the area dry.  Treatment of Mild Rectal Itching:  The main treatment for rectal itching is keeping the rectal area clean and dry, and avoiding excessive rubbing or scratching. Loose cotton underwear helps keep the area dry.  Cleansing after a Bowel Movement - Cleanse the anus with warm water after each bowel movement. Use wet cotton or tissue. Pat the area dry using unscented toilet paper. Avoid rubbing area with toilet paper.  Topical Hydrocortisone Twice a Day - Apply 1% hydrocortisone ointment (OTC) bid to reduce irritation (e.g., Anusol HC, Preparation H Hydrocortisone).  Call Back If:  Severe rectal pain or itching  Rectal pain or itching lasts over 3 days  Rectal bleeding (i.e., more than just a few drops on toilet paper from wiping)  You become worse.  Patient Refused Recommendation:  Patient Refused Care Advice  She cannot make an appt for today. She has no ride.  PLEASE CONTACT PATIENT - [phone].  PHARMACY -[facility].

## 2012-04-02 ENCOUNTER — Other Ambulatory Visit: Payer: Self-pay | Admitting: *Deleted

## 2012-04-02 MED ORDER — CLOTRIMAZOLE-BETAMETHASONE 1-0.05 % EX CREA: TOPICAL_CREAM | Freq: Two times a day (BID) | CUTANEOUS | Status: DC

## 2012-04-02 MED ORDER — FLUCONAZOLE 100 MG PO TABS: 100.0000 mg | ORAL_TABLET | Freq: Every day | ORAL | Status: DC

## 2012-04-02 NOTE — Telephone Encounter (Signed)
Left message on machine What [name] prescribed. Sent to [facility]

## 2012-04-02 NOTE — Telephone Encounter (Signed)
Lotrisone cream BID and diflucan 100 x 14 days

## 2012-04-05 ENCOUNTER — Telehealth: Payer: Self-pay | Admitting: Family

## 2012-04-05 ENCOUNTER — Encounter: Payer: Medicare Other | Admitting: Family

## 2012-04-05 NOTE — Telephone Encounter (Signed)
Appointment rescheduled.

## 2012-04-05 NOTE — Telephone Encounter (Signed)
Patient missed INR appoitment today. Please rescheduled asap. 

## 2012-04-06 ENCOUNTER — Ambulatory Visit (INDEPENDENT_AMBULATORY_CARE_PROVIDER_SITE_OTHER): Payer: Medicare Other | Admitting: Endocrinology

## 2012-04-06 ENCOUNTER — Encounter: Payer: Self-pay | Admitting: Endocrinology

## 2012-04-06 VITALS — BP 136/82 | HR 74 | Wt 241.0 lb

## 2012-04-06 DIAGNOSIS — E1059 Type 1 diabetes mellitus with other circulatory complications: Secondary | ICD-10-CM

## 2012-04-06 MED ORDER — INSULIN DETEMIR 100 UNIT/ML ~~LOC~~ SOLN: 180.0000 [IU] | Freq: Every day | SUBCUTANEOUS | Status: DC

## 2012-04-06 NOTE — Patient Instructions (Addendum)
Change novolog to levemir, 180 units each morning.  It is ok to use-up your current insulin first.   check your blood sugar 2 times a day.  vary the time of day when you check, between before the 3 meals, and at bedtime.  also check if you have symptoms of your blood sugar being too high or too low.  please keep a record of the readings and bring it to your next appointment here.  please call us sooner if you are having low blood sugar episodes.  Please make a follow-up appointment in 6 weeks.

## 2012-04-06 NOTE — Progress Notes (Signed)
Subjective:    Patient ID: Kristen [name], female    DOB: Jul 24, 1945, 67 y.o.   MRN: [id number]  HPI Pt returns for f/u of insulin-requiring DM (dx'ed 2008; complicated by CAD and PAD).  She has had trouble taking the multiple daily injections.  She agrees to take  QD insulin, but she wants to use-up what she has, first.  Staff reports pt was very upset on the phone about her insulin being delivered from [facility].  She again brings this up during the visit.  Past Medical History  Diagnosis Date  . CAD (coronary artery disease)   . PAF (paroxysmal atrial fibrillation)   . Hypertension   . Hyperlipidemia   . GERD (gastroesophageal reflux disease)   . Hypokalemia   . Diabetes mellitus   . Depression   . Lumbar back pain   . LBBB (left bundle branch block)   . Headache   . Lymphadenitis   . Arthritis   . Bursitis   . Chronic anticoagulation   . Heart palpitations   . Diastolic dysfunction     per echo in October 2012 with EF 50 to 55%  . 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 GERD (gastroesophageal reflux disease) 10/23/2003  . Neuromuscular disorder     fibromyalgia  . Blood transfusion without reported diagnosis     Past Surgical History  Procedure Date  . Angioplasty laminectomy  . Lumbar laminectomy     x3  . Coronary artery bypass graft     LIMA to LAD   . Abdominal hysterectomy   . Cholecystectomy   . Tonsillectomy   . Knee arthroscopy   . Sphincterotomy     History   Social History  . Marital Status: Legally Separated    Spouse Name: N/A    Number of Children: N/A  . Years of Education: N/A   Occupational History  . Not on file.   Social History Main Topics  . Smoking status: Former Smoker    Quit date: 03/07/2001  . Smokeless tobacco: Never Used  . Alcohol Use: No  . Drug Use: No  . Sexually Active: Not on file   Other Topics Concern  . Not on file   Social History Narrative  . No narrative on file    Current Outpatient  Prescriptions on File Prior to Visit  Medication Sig Dispense Refill  . ALPRAZolam (XANAX) 1 MG tablet TAKE 1 TABLET 3 TIMES A DAY AS NEEDED FOR GRIEF  90 tablet  3  . aspirin 81 MG tablet Take 81 mg by mouth daily.        Marland Kitchen atorvastatin (LIPITOR) 20 MG tablet Take 40 mg by mouth at bedtime.       . clotrimazole-betamethasone (LOTRISONE) cream Apply topically 2 (two) times daily.  45 g  0  . cyclobenzaprine (FLEXERIL) 10 MG tablet Take 10 mg by mouth 2 (two) times daily as needed. For muscle spasms      . digoxin (LANOXIN) 0.25 MG tablet TAKE 1 TABLET EVERY DAY  90 tablet  2  . enoxaparin (LOVENOX) 150 MG/ML injection Inject 1 mL (150 mg total) into the skin every 12 (twelve) hours.  15 Syringe  0  . fluconazole (DIFLUCAN) 100 MG tablet Take 1 tablet (100 mg total) by mouth daily.  14 tablet  0  . furosemide (LASIX) 40 MG tablet Take 1 tablet (40 mg total) by mouth daily.  30 tablet  3  . gabapentin (NEURONTIN) 300  MG capsule TAKE 1 CAPSULE THREE TIMES A DAY  90 capsule  2  . hydrochlorothiazide (MICROZIDE) 12.5 MG capsule TAKE ONE CAPSULE BY MOUTH EVERY DAY  90 capsule  0  . HYDROcodone-acetaminophen (NORCO) 10-325 MG per tablet TAKE 1 TABLET BY MOUTH EVERY 4 HOURS AS NEEDED  150 tablet  3  . hydrocortisone 2.5 % cream       . insulin detemir (LEVEMIR FLEXPEN) 100 UNIT/ML injection Inject 180 Units into the skin at bedtime.  60 mL  12  . Insulin Pen Needle 31G X 8 MM MISC Use as directed three times a day  300 each  2  . KLOR-CON M20 20 MEQ tablet TAKE 1 & 1/2 TABLETS BY MOUTH ONCE A DAY  45 tablet  5  . lansoprazole (PREVACID) 30 MG capsule Take 30 mg by mouth daily as needed. For heartburn      . metoprolol succinate (TOPROL-XL) 100 MG 24 hr tablet Take 1 tablet (100 mg total) by mouth 2 (two) times daily. Take with or immediately following a meal.  60 tablet  6  . nitroGLYCERIN (NITROSTAT) 0.4 MG SL tablet Place 0.4 mg under the tongue every 5 (five) minutes as needed. For chest pain      .  ONE TOUCH ULTRA TEST test strip       . ONE TOUCH ULTRA TEST test strip USE 6 TIMES DAILY AS DIRECTED  200 each  2  . spironolactone (ALDACTONE) 25 MG tablet Take 25 mg by mouth 2 (two) times daily.      Marland Kitchen telmisartan (MICARDIS) 40 MG tablet Take 40 mg by mouth daily.        Marland Kitchen warfarin (COUMADIN) 6 MG tablet Take 3-6 mg by mouth daily. 6mg  on mondays 3 mg all other days       Current Facility-Administered Medications on File Prior to Visit  Medication Dose Route Frequency Provider Last Rate Last Dose  . methylPREDNISolone acetate (DEPO-MEDROL) injection 40 mg  40 mg Intramuscular Once Stacie Glaze, MD        Allergies  Allergen Reactions  . Cleocin (Clindamycin Hcl) Diarrhea  . Codeine Itching  . Doxycycline Hyclate Diarrhea  . Morphine Hives  . Pentazocine Lactate Itching and Nausea And Vomiting  . Vibramycin (Doxycycline Calcium) Diarrhea  . Sulfonamide Derivatives Rash    Family History  Problem Relation Age of Onset  . Heart attack Father   . Heart disease Father   . Kidney disease    . Stroke    . Arthritis    . Hypertension    . Diabetes    . Stroke Mother   . Colon cancer Paternal Uncle    BP 136/82  Pulse 74  Wt 241 lb (109.317 kg)  SpO2 96%  Review of Systems denies hypoglycemia    Objective:   Physical Exam VITAL SIGNS:  See vs page GENERAL: no distress PSYCH: Alert and oriented x 3.  She is anxious     Assessment & Plan:  DM: she needs a simpler regimen Anxiety over service issues.  She continues to be dissatisfied with the service she receives for the office.  i told pt it is in her best interest to move on from this.

## 2012-04-09 ENCOUNTER — Telehealth: Payer: Self-pay | Admitting: Internal Medicine

## 2012-04-09 NOTE — Telephone Encounter (Signed)
If you give me size she needs, I can fax the order over to [facility]

## 2012-04-09 NOTE — Telephone Encounter (Signed)
Patient called stating that she need to change her rx for compression hose from the thigh high to the knee high as they can not fit. Please assist.

## 2012-04-09 NOTE — Telephone Encounter (Signed)
[name] said 20-30 mm.  Order faxed to [facility] medical supply, pt aware

## 2012-04-11 ENCOUNTER — Ambulatory Visit (INDEPENDENT_AMBULATORY_CARE_PROVIDER_SITE_OTHER): Payer: Medicare Other | Admitting: Family

## 2012-04-11 DIAGNOSIS — Z7901 Long term (current) use of anticoagulants: Secondary | ICD-10-CM

## 2012-04-11 DIAGNOSIS — M79644 Pain in right finger(s): Secondary | ICD-10-CM

## 2012-04-11 DIAGNOSIS — I4891 Unspecified atrial fibrillation: Secondary | ICD-10-CM

## 2012-04-11 DIAGNOSIS — M79609 Pain in unspecified limb: Secondary | ICD-10-CM

## 2012-04-11 NOTE — Patient Instructions (Addendum)
Continue 6mg  on Monday and 3mg  all other days. Recheck in 1 month.    Latest dosing instructions   Total Kristen [name] Tue Wed Thu Fri Sat   24 3 mg 6 mg 3 mg 3 mg 3 mg 3 mg 3 mg    (6 mg0.5) (6 mg1) (6 mg0.5) (6 mg0.5) (6 mg0.5) (6 mg0.5) (6 mg0.5)

## 2012-04-16 ENCOUNTER — Ambulatory Visit (INDEPENDENT_AMBULATORY_CARE_PROVIDER_SITE_OTHER)
Admission: RE | Admit: 2012-04-16 | Discharge: 2012-04-16 | Disposition: A | Discharge status: Home / Self Care | Payer: Medicare Other | Source: Ambulatory Visit | Attending: Family | Admitting: Family

## 2012-04-16 DIAGNOSIS — M79609 Pain in unspecified limb: Secondary | ICD-10-CM

## 2012-04-16 DIAGNOSIS — M79644 Pain in right finger(s): Secondary | ICD-10-CM

## 2012-04-23 ENCOUNTER — Other Ambulatory Visit: Payer: Self-pay | Admitting: Internal Medicine

## 2012-04-30 ENCOUNTER — Other Ambulatory Visit: Payer: Self-pay | Admitting: Internal Medicine

## 2012-05-03 ENCOUNTER — Other Ambulatory Visit: Payer: Self-pay | Admitting: Internal Medicine

## 2012-05-07 ENCOUNTER — Other Ambulatory Visit: Payer: Self-pay | Admitting: *Deleted

## 2012-05-07 ENCOUNTER — Ambulatory Visit: Payer: Medicare Other | Admitting: Family

## 2012-05-07 ENCOUNTER — Encounter: Payer: Self-pay | Admitting: Internal Medicine

## 2012-05-07 ENCOUNTER — Ambulatory Visit (INDEPENDENT_AMBULATORY_CARE_PROVIDER_SITE_OTHER): Payer: Medicare Other | Admitting: Internal Medicine

## 2012-05-07 VITALS — BP 130/70 | HR 88 | Temp 98.2°F | Resp 16 | Ht 62.0 in | Wt 245.0 lb

## 2012-05-07 DIAGNOSIS — E039 Hypothyroidism, unspecified: Secondary | ICD-10-CM

## 2012-05-07 DIAGNOSIS — D649 Anemia, unspecified: Secondary | ICD-10-CM

## 2012-05-07 DIAGNOSIS — I10 Essential (primary) hypertension: Secondary | ICD-10-CM

## 2012-05-07 DIAGNOSIS — Z7901 Long term (current) use of anticoagulants: Secondary | ICD-10-CM

## 2012-05-07 DIAGNOSIS — I4891 Unspecified atrial fibrillation: Secondary | ICD-10-CM

## 2012-05-07 DIAGNOSIS — R635 Abnormal weight gain: Secondary | ICD-10-CM

## 2012-05-07 DIAGNOSIS — M25569 Pain in unspecified knee: Secondary | ICD-10-CM

## 2012-05-07 DIAGNOSIS — I2510 Atherosclerotic heart disease of native coronary artery without angina pectoris: Secondary | ICD-10-CM

## 2012-05-07 DIAGNOSIS — M25561 Pain in right knee: Secondary | ICD-10-CM

## 2012-05-07 DIAGNOSIS — E785 Hyperlipidemia, unspecified: Secondary | ICD-10-CM

## 2012-05-07 DIAGNOSIS — E1059 Type 1 diabetes mellitus with other circulatory complications: Secondary | ICD-10-CM

## 2012-05-07 LAB — CBC WITH DIFFERENTIAL/PLATELET
Basophils Absolute: 0 10*3/uL (ref 0.0–0.1)
Eosinophils Absolute: 0.1 10*3/uL (ref 0.0–0.7)
HCT: 40.8 % (ref 36.0–46.0)
Lymphs Abs: 3.4 10*3/uL (ref 0.7–4.0)
MCV: 94.7 fl (ref 78.0–100.0)
Monocytes Absolute: 0.6 10*3/uL (ref 0.1–1.0)
Monocytes Relative: 8.7 % (ref 3.0–12.0)
Platelets: 270 10*3/uL (ref 150.0–400.0)
RDW: 14.4 % (ref 11.5–14.6)

## 2012-05-07 LAB — BASIC METABOLIC PANEL
BUN: 19 mg/dL (ref 6–23)
Chloride: 106 mEq/L (ref 96–112)
Creatinine, Ser: 0.9 mg/dL (ref 0.4–1.2)

## 2012-05-07 LAB — POCT INR: INR: 2.4

## 2012-05-07 LAB — HEMOGLOBIN A1C: Hgb A1c MFr Bld: 8.1 % — ABNORMAL HIGH (ref 4.6–6.5)

## 2012-05-07 MED ORDER — LEVOMILNACIPRAN HCL ER 40 MG PO CP24: 1.0000 | ORAL_CAPSULE | Freq: Every day | ORAL | Status: DC

## 2012-05-07 MED ORDER — HYDROCODONE-ACETAMINOPHEN 10-325 MG PO TABS: 1.0000 | ORAL_TABLET | ORAL | Status: DC | PRN

## 2012-05-07 NOTE — Patient Instructions (Signed)
Continue 6mg  on Monday and 3mg  all other days. Recheck in 6 weeks  Anticoagulation Dose Instructions as of 05/07/2012     Glynis Smiles Tue Wed Thu Fri Sat   New Dose 3 mg 6 mg 3 mg 3 mg 3 mg 3 mg 3 mg    Description       Continue 6mg  on Monday and 3mg  all other days. Recheck in 6 weeks

## 2012-05-07 NOTE — Addendum Note (Signed)
Addended by: Stacie Glaze on: 05/07/2012 09:31 AM   Modules accepted: Orders

## 2012-05-07 NOTE — Addendum Note (Signed)
Addended by: Bonnye Fava on: 05/07/2012 09:47 AM   Modules accepted: Orders

## 2012-05-07 NOTE — Patient Instructions (Signed)
Increase the Lasix from 20-40 and monitor your weight and her foot and leg edema If you've weight drops and  a little lightheaded he can back off to the 20th Lasix   I would like you to try to go to the higher dose of Levemir as soon as possible and he may split the dose to one shot twice a day of 90  The pain is interfering with her ability to walk (foot and knee pain) Patient will need transportation to re-enroll and cardiac maintenance and this is critical for her overall prognosis

## 2012-05-07 NOTE — Progress Notes (Signed)
Subjective:    Patient ID: Kristen [name], female    DOB: 09/10/1945, 67 y.o.   MRN: [id number]  HPI Has gained weight Increased musculoskeletal  pain inhibits exercise Increase in feet swelling mild and pretibial edema present DM control poor hemangiomas increased on coumadin     Review of Systems  Constitutional: Negative for activity change, appetite change and fatigue.  HENT: Negative for ear pain, congestion, neck pain, postnasal drip and sinus pressure.   Eyes: Negative for redness and visual disturbance.  Respiratory: Positive for shortness of breath. Negative for cough and wheezing.   Cardiovascular: Positive for palpitations and leg swelling.  Gastrointestinal: Negative for abdominal pain and abdominal distention.  Genitourinary: Positive for frequency. Negative for dysuria and menstrual problem.  Musculoskeletal: Negative for myalgias, joint swelling and arthralgias.  Skin: Negative for rash and wound.  Neurological: Positive for weakness. Negative for dizziness and headaches.  Hematological: Negative for adenopathy. Does not bruise/bleed easily.  Psychiatric/Behavioral: Positive for dysphoric mood. Negative for sleep disturbance and decreased concentration.   Past Medical History  Diagnosis Date  . CAD (coronary artery disease)   . PAF (paroxysmal atrial fibrillation)   . Hypertension   . Hyperlipidemia   . GERD (gastroesophageal reflux disease)   . Hypokalemia   . Diabetes mellitus   . Depression   . Lumbar back pain   . LBBB (left bundle branch block)   . Headache   . Lymphadenitis   . Arthritis   . Bursitis   . Chronic anticoagulation   . Heart palpitations   . Diastolic dysfunction     per echo in October 2012 with EF 50 to 55%  . 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 GERD (gastroesophageal reflux disease) 10/23/2003  . Neuromuscular disorder     fibromyalgia  . Blood transfusion without reported diagnosis     History   Social History   . Marital Status: Legally Separated    Spouse Name: N/A    Number of Children: N/A  . Years of Education: N/A   Occupational History  . Not on file.   Social History Main Topics  . Smoking status: Former Smoker    Quit date: 03/07/2001  . Smokeless tobacco: Never Used  . Alcohol Use: No  . Drug Use: No  . Sexually Active: Not on file   Other Topics Concern  . Not on file   Social History Narrative  . No narrative on file    Past Surgical History  Procedure Laterality Date  . Angioplasty  laminectomy  . Lumbar laminectomy      x3  . Coronary artery bypass graft      LIMA to LAD   . Abdominal hysterectomy    . Cholecystectomy    . Tonsillectomy    . Knee arthroscopy    . Sphincterotomy      Family History  Problem Relation Age of Onset  . Heart attack Father   . Heart disease Father   . Kidney disease    . Stroke    . Arthritis    . Hypertension    . Diabetes    . Stroke Mother   . Colon cancer Paternal Uncle     Allergies  Allergen Reactions  . Cleocin (Clindamycin Hcl) Diarrhea  . Codeine Itching  . Doxycycline Hyclate Diarrhea  . Morphine Hives  . Pentazocine Lactate Itching and Nausea And Vomiting  . Vibramycin (Doxycycline Calcium) Diarrhea  . Sulfonamide Derivatives Rash  Current Outpatient Prescriptions on File Prior to Visit  Medication Sig Dispense Refill  . ALPRAZolam (XANAX) 1 MG tablet TAKE 1 TABLET 3 TIMES A DAY AS NEEDED FOR GRIEF  90 tablet  3  . aspirin 81 MG tablet Take 81 mg by mouth daily.        Marland Kitchen atorvastatin (LIPITOR) 20 MG tablet TAKE 2 TABLETS BY MOUTH DAILY  60 tablet  11  . clotrimazole-betamethasone (LOTRISONE) cream Apply topically 2 (two) times daily.  45 g  0  . cyclobenzaprine (FLEXERIL) 10 MG tablet Take 10 mg by mouth 2 (two) times daily as needed. For muscle spasms      . digoxin (LANOXIN) 0.25 MG tablet TAKE 1 TABLET EVERY DAY  90 tablet  2  . gabapentin (NEURONTIN) 300 MG capsule TAKE 1 CAPSULE THREE TIMES A  DAY  90 capsule  2  . hydrochlorothiazide (MICROZIDE) 12.5 MG capsule TAKE ONE CAPSULE BY MOUTH EVERY DAY  90 capsule  0  . HYDROcodone-acetaminophen (NORCO) 10-325 MG per tablet TAKE 1 TABLET BY MOUTH EVERY 4 HOURS AS NEEDED  150 tablet  3  . hydrocortisone 2.5 % cream       . insulin detemir (LEVEMIR FLEXPEN) 100 UNIT/ML injection Inject 180 Units into the skin at bedtime.  60 mL  12  . Insulin Pen Needle 31G X 8 MM MISC Use as directed three times a day  300 each  2  . KLOR-CON M20 20 MEQ tablet TAKE 1 & 1/2 TABLETS BY MOUTH ONCE A DAY  45 tablet  5  . lansoprazole (PREVACID) 30 MG capsule Take 30 mg by mouth daily as needed. For heartburn      . metoprolol succinate (TOPROL-XL) 100 MG 24 hr tablet Take 1 tablet (100 mg total) by mouth 2 (two) times daily. Take with or immediately following a meal.  60 tablet  6  . nitroGLYCERIN (NITROSTAT) 0.4 MG SL tablet Place 0.4 mg under the tongue every 5 (five) minutes as needed. For chest pain      . ONE TOUCH ULTRA TEST test strip       . ONE TOUCH ULTRA TEST test strip USE 6 TIMES DAILY AS DIRECTED  200 each  2  . spironolactone (ALDACTONE) 25 MG tablet Take 25 mg by mouth 2 (two) times daily.      Marland Kitchen spironolactone (ALDACTONE) 25 MG tablet TAKE 1 TABLET (25 MG TOTAL) BY MOUTH 2 (TWO) TIMES DAILY.  60 tablet  6  . telmisartan (MICARDIS) 40 MG tablet Take 40 mg by mouth daily.        Marland Kitchen warfarin (COUMADIN) 6 MG tablet Take 3-6 mg by mouth daily. 6mg  on mondays 3 mg all other days       Current Facility-Administered Medications on File Prior to Visit  Medication Dose Route Frequency Provider Last Rate Last Dose  . methylPREDNISolone acetate (DEPO-MEDROL) injection 40 mg  40 mg Intramuscular Once Stacie Glaze, MD        BP 130/70  Pulse 88  Temp(Src) 98.2 F (36.8 C)  Resp 16  Ht 5\' 2"  (1.575 m)  Wt 245 lb (111.131 kg)  BMI 44.8 kg/m2       Objective:   Physical Exam  Nursing note and vitals reviewed. Constitutional: She is oriented to  person, place, and time. She appears well-developed and well-nourished. No distress.  HENT:  Head: Normocephalic and atraumatic.  Right Ear: External ear normal.  Left Ear: External ear normal.  Nose:  Nose normal.  Mouth/Throat: Oropharynx is clear and moist.  Eyes: Conjunctivae and EOM are normal. Pupils are equal, round, and reactive to light.  Neck: Normal range of motion. Neck supple. No JVD present. No tracheal deviation present. No thyromegaly present.  Cardiovascular: Normal rate, regular rhythm, normal heart sounds and intact distal pulses.   No murmur heard. Pulmonary/Chest: Effort normal and breath sounds normal. She has no wheezes. She exhibits no tenderness.  Abdominal: Soft. Bowel sounds are normal.  Musculoskeletal: Normal range of motion. She exhibits edema and tenderness.  Tenderness oin the right foot  Lymphadenopathy:    She has no cervical adenopathy.  Neurological: She is oriented to person, place, and time. She has normal reflexes. No cranial nerve deficit.  Skin: She is not diaphoretic.          Assessment & Plan:  Trouble with DM control Needs A1C and reveiwed the endocrine consult for levimir  180 u at bed time? The levimir may drive her into the donut hole quickly Blood pressure stable This is part of the weight gain 3rd spacing of fluid increase the furosemide to 40 Labs to include a1c and and bmet Skat needed to get to rehab Needs to resume cardiac rehab Do to her knee and foot pain she is unable to exercise or walk any significant distance riding the buses prohibitive for her to be able to make it to rehabilitation she will need to reck transportation.  Alternative forms of exercise that can be monitored in cardiac rehabilitation or essential for both her diabetic control her cardiovascular health and weight management which is not addressed will result in significant comorbidities.

## 2012-05-07 NOTE — Addendum Note (Signed)
Addended by: Bonnye Fava on: 05/07/2012 09:45 AM   Modules accepted: Orders

## 2012-05-08 ENCOUNTER — Telehealth: Payer: Self-pay | Admitting: Internal Medicine

## 2012-05-08 NOTE — Telephone Encounter (Signed)
Please let pt know she puts on in am and off at bedtime--it will get easier with time, but [name] wants her to wear them

## 2012-05-08 NOTE — Telephone Encounter (Signed)
Patient Information:  Caller Name: Twinkle  Phone: [phone]  Patient: Kristen [name], Charlcie Prisco  Gender: Female  DOB: 1945-07-10  Age: 67 Years  PCP: Darryll Capers (Adults only)  Office Follow Up:  Does the office need to follow up with this patient?: Yes  Instructions For The Office: Patient is not comfortable in her compression stockings and wants to know if this is normal or if she should continue to wear them.  See triage note.  krs/can  RN Note:  States she wore the stockings x 11 hours, and noted when she removed them, she had soreness and fluid/indentation in the center of the right calf area.  States improved overnight.  Took 40mg  lasix as directed, and right leg looked better.  States her compression stockings seem to cause more problems than they seem to correct.  States she was measured for the stockings, and it took over a month to receive them.  States she had to reorder them, because they did not fit well, and were reordered in a knee high.  The second pair is what she picked up 05/07/12.  States her thighs now have firm swelling and indentation. Does not want to wear the stocking.  Per edema protocol, emergent symptoms denied; advised appt within 2 weeks.  Patient states has appt 06/04/12 with Dr. Lovell Sheehan in follow up.  Wants note to go to Dr. Lovell Sheehan for review/advice regarding whether to continue wearing them.  States she feels the 40mg  of lasix does better than the stockings do.  Info to office for provider review/callback.  May reach patient at [phone].  Symptoms  Reason For Call & Symptoms: Patient was able to pick up compression stockings 05/07/12.  States she placed them on at 1200 05/07/12; they were very snug.  States she had some pain around the inside of the knee with swelling of thigh.  Reviewed Health History In EMR: Yes  Reviewed Medications In EMR: Yes  Reviewed Allergies In EMR: Yes  Reviewed Surgeries / Procedures: Yes  Date of Onset of Symptoms:  05/07/2012  Guideline(s) Used:  Leg Swelling and Edema  Disposition Per Guideline:   See Within 2 Weeks in Office  Reason For Disposition Reached:   Mild swelling of both ankles and chronic (unchanged)  Advice Given:  N/A  Patient Refused Recommendation:  Patient Refused Appt, Patient Requests Appt At Later Date  Has appt scheduled 06/04/12 and prefers appt then rather than within 2 weeks.  Info to office.  krs/can

## 2012-05-08 NOTE — Telephone Encounter (Signed)
Spoke with patient.

## 2012-05-09 ENCOUNTER — Encounter: Payer: Medicare Other | Admitting: Family

## 2012-05-12 ENCOUNTER — Telehealth: Payer: Self-pay | Admitting: Physician Assistant

## 2012-05-12 NOTE — Telephone Encounter (Signed)
Pt called regarding labs that were released MyChart. She was seen by PCP on 05/07/12 at which time her Lasix was increased. Had labwork by PCP that day with K of 3.4. Is taking KCl and wants to know if dose should be adjusted. Do not see plan for further f/u regarding lasix. We have not been following her for this issue. I advised she contact PCP since that is who is managing her lasix and labwork, so that the lines don't get crossed as to what exactly she should be taking. She verbalized understanding and plans to do so, especially since she has other questions she would like to ask them as well. Dayna Dunn PA-C

## 2012-05-14 ENCOUNTER — Telehealth: Payer: Self-pay | Admitting: Internal Medicine

## 2012-05-14 ENCOUNTER — Ambulatory Visit (INDEPENDENT_AMBULATORY_CARE_PROVIDER_SITE_OTHER): Payer: Medicare Other | Admitting: Internal Medicine

## 2012-05-14 ENCOUNTER — Encounter: Payer: Self-pay | Admitting: Internal Medicine

## 2012-05-14 VITALS — BP 120/80 | HR 97 | Temp 97.4°F | Resp 20 | Wt 244.0 lb

## 2012-05-14 DIAGNOSIS — E1059 Type 1 diabetes mellitus with other circulatory complications: Secondary | ICD-10-CM

## 2012-05-14 DIAGNOSIS — Z6841 Body Mass Index (BMI) 40.0 and over, adult: Secondary | ICD-10-CM

## 2012-05-14 DIAGNOSIS — M25519 Pain in unspecified shoulder: Secondary | ICD-10-CM

## 2012-05-14 DIAGNOSIS — I10 Essential (primary) hypertension: Secondary | ICD-10-CM

## 2012-05-14 DIAGNOSIS — M25512 Pain in left shoulder: Secondary | ICD-10-CM

## 2012-05-14 DIAGNOSIS — I2510 Atherosclerotic heart disease of native coronary artery without angina pectoris: Secondary | ICD-10-CM

## 2012-05-14 DIAGNOSIS — M1990 Unspecified osteoarthritis, unspecified site: Secondary | ICD-10-CM

## 2012-05-14 MED ORDER — POTASSIUM CHLORIDE CRYS ER 20 MEQ PO TBCR: EXTENDED_RELEASE_TABLET | ORAL | Status: DC

## 2012-05-14 NOTE — Telephone Encounter (Signed)
[facility] Triage Call Report Triage Record Num: [id number] Operator: Baltazar Apo Patient Name: Kristen [name] Call Date & Time: 05/12/2012 2:01:39PM Patient Phone: [phone] PCP: Darryll Capers Patient Gender: Female PCP Fax : [phone] Patient DOB: 08/16/1945 [facility] Name[facility] Reason for Call: Caller: [name]/Patient; PCP: Darryll Capers (Adults only); CB#: [id number]; Call regarding Potassium concerns ; Potassium 3.4 and is on Lasix, Saw [name] on Monday 3/3 concerning leg swelling, All emergent symptoms negative per Leg Non-Injury Protocol. Advised see provider within 72 hours due to "Soft, balloon -like swelling behind knee that may or may not be painful and has not been previously evaluated" Patient will follow Care Advice. Appointment made via [facility] for Monday 3/10 at 10:15 with Dr Amador Cunas. Advised patient to take her lower dose of Lasix for this weekend as she had discussed earlier with [name] since the larger 40mg  dose makes her feel bad. Protocol(s) Used: Leg Non-Injury Recommended Outcome per Protocol: See Provider within 72 Hours Reason for Outcome: Soft, balloon-like swelling behind knee that may or may not be painful AND has not been previously evaluated Care Advice: Limit weight-bearing activity until evaluated by provider. Avoid movements or exercises that aggravate symptoms, such as jogging, stair-climbing, prolonged standing, etc. ~ ~ SYMPTOM / CONDITION MANAGEMENT Analgesic/Antipyretic Advice - Acetaminophen: Consider acetaminophen as directed on label or by pharmacist/provider for pain or fever PRECAUTIONS: - Use if there is no history of liver disease, alcoholism, or intake of three or more alcohol drinks per day - Only if approved by provider during pregnancy or when breastfeeding - During pregnancy, acetaminophen should not be taken more than 3 consecutive days without telling provider - Do not exceed  recommended dose or frequency ~ Analgesic/Antipyretic Advice - NSAIDs: Consider aspirin, ibuprofen, naproxen or ketoprofen for pain or fever as directed on label or by pharmacist/provider. PRECAUTIONS: - If over 37 years of age, should not take longer than 1 week without consulting provider. EXCEPTIONS: - Should not be used if taking blood thinners or have bleeding problems. - Do not use if have history of sensitivity/allergy to any of these medications; or history of cardiovascular, ulcer, kidney, liver disease or diabetes unless approved by provider. - Do not exceed recommended dose or frequency. ~ 05/12/2012 2:38:58PM Page 1 of 1 [id number]

## 2012-05-14 NOTE — Patient Instructions (Signed)
Orthopedic followup as discussed  Limit your sodium (Salt) intake  Decrease furosemide to 20 mg daily. May take 40 mg if needed for swelling  Increase potassium to one tablet twice daily

## 2012-05-14 NOTE — Progress Notes (Signed)
Subjective:    Patient ID: Kristen [name], female    DOB: 04/26/1945, 67 y.o.   MRN: [id number]  HPI  67 year old patient who is seen today complaining primarily of bilateral knee pain. She's also concerned somewhat about a nodule in the left popliteal region.  She has been seen recently by her PCP endocrinology and cardiology.  Recent potassium level down to 3.4. Lasix 7 days ago was increased to 40 mg daily. She has chronic atrial fibrillation and is on Coumadin anticoagulation. Medical problems include osteoarthritis and also a history of fibromyalgia.  She is followed by orthopedic surgery and has had arthroscopic right knee surgery on 2 occasions. She also complains some left shoulder pain that has been a chronic problem Weight is down 1 pound over the past 7 to  Past Medical History  Diagnosis Date  . CAD (coronary artery disease)   . PAF (paroxysmal atrial fibrillation)   . Hypertension   . Hyperlipidemia   . GERD (gastroesophageal reflux disease)   . Hypokalemia   . Diabetes mellitus   . Depression   . Lumbar back pain   . LBBB (left bundle branch block)   . Headache   . Lymphadenitis   . Arthritis   . Bursitis   . Chronic anticoagulation   . Heart palpitations   . Diastolic dysfunction     per echo in October 2012 with EF 50 to 55%  . 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 GERD (gastroesophageal reflux disease) 10/23/2003  . Neuromuscular disorder     fibromyalgia  . Blood transfusion without reported diagnosis     History   Social History  . Marital Status: Legally Separated    Spouse Name: N/A    Number of Children: N/A  . Years of Education: N/A   Occupational History  . Not on file.   Social History Main Topics  . Smoking status: Former Smoker    Quit date: 03/07/2001  . Smokeless tobacco: Never Used  . Alcohol Use: No  . Drug Use: No  . Sexually Active: Not on file   Other Topics Concern  . Not on file   Social History Narrative  . No  narrative on file    Past Surgical History  Procedure Laterality Date  . Angioplasty  laminectomy  . Lumbar laminectomy      x3  . Coronary artery bypass graft      LIMA to LAD   . Abdominal hysterectomy    . Cholecystectomy    . Tonsillectomy    . Knee arthroscopy    . Sphincterotomy      Family History  Problem Relation Age of Onset  . Heart attack Father   . Heart disease Father   . Kidney disease    . Stroke    . Arthritis    . Hypertension    . Diabetes    . Stroke Mother   . Colon cancer Paternal Uncle     Allergies  Allergen Reactions  . Cleocin (Clindamycin Hcl) Diarrhea  . Codeine Itching  . Doxycycline Hyclate Diarrhea  . Morphine Hives  . Pentazocine Lactate Itching and Nausea And Vomiting  . Vibramycin (Doxycycline Calcium) Diarrhea  . Sulfonamide Derivatives Rash    Current Outpatient Prescriptions on File Prior to Visit  Medication Sig Dispense Refill  . ALPRAZolam (XANAX) 1 MG tablet TAKE 1 TABLET 3 TIMES A DAY AS NEEDED FOR GRIEF  90 tablet  3  . aspirin 81  MG tablet Take 81 mg by mouth daily.        Marland Kitchen atorvastatin (LIPITOR) 20 MG tablet TAKE 2 TABLETS BY MOUTH DAILY  60 tablet  11  . clotrimazole-betamethasone (LOTRISONE) cream Apply topically 2 (two) times daily.  45 g  0  . cyclobenzaprine (FLEXERIL) 10 MG tablet Take 10 mg by mouth 2 (two) times daily as needed. For muscle spasms      . digoxin (LANOXIN) 0.25 MG tablet TAKE 1 TABLET EVERY DAY  90 tablet  2  . furosemide (LASIX) 40 MG tablet Take 20 mg by mouth daily.      Marland Kitchen gabapentin (NEURONTIN) 300 MG capsule TAKE 1 CAPSULE THREE TIMES A DAY  90 capsule  2  . hydrochlorothiazide (MICROZIDE) 12.5 MG capsule TAKE ONE CAPSULE BY MOUTH EVERY DAY  90 capsule  0  . HYDROcodone-acetaminophen (NORCO) 10-325 MG per tablet Take 1 tablet by mouth every 4 (four) hours as needed for pain.  150 tablet  3  . hydrocortisone 2.5 % cream       . insulin detemir (LEVEMIR FLEXPEN) 100 UNIT/ML injection Inject  180 Units into the skin at bedtime.  60 mL  12  . Insulin Pen Needle 31G X 8 MM MISC Use as directed three times a day  300 each  2  . KLOR-CON M20 20 MEQ tablet TAKE 1 & 1/2 TABLETS BY MOUTH ONCE A DAY  45 tablet  5  . lansoprazole (PREVACID) 30 MG capsule Take 30 mg by mouth daily as needed. For heartburn      . Levomilnacipran HCl ER (FETZIMA) 40 MG CP24 Take 1 capsule by mouth daily.  30 capsule    . metoprolol succinate (TOPROL-XL) 100 MG 24 hr tablet Take 1 tablet (100 mg total) by mouth 2 (two) times daily. Take with or immediately following a meal.  60 tablet  6  . nitroGLYCERIN (NITROSTAT) 0.4 MG SL tablet Place 0.4 mg under the tongue every 5 (five) minutes as needed. For chest pain      . ONE TOUCH ULTRA TEST test strip USE 6 TIMES DAILY AS DIRECTED  200 each  2  . spironolactone (ALDACTONE) 25 MG tablet TAKE 1 TABLET (25 MG TOTAL) BY MOUTH 2 (TWO) TIMES DAILY.  60 tablet  6  . telmisartan (MICARDIS) 40 MG tablet Take 40 mg by mouth daily.        Marland Kitchen warfarin (COUMADIN) 6 MG tablet Take 3-6 mg by mouth daily. 6mg  on mondays 3 mg all other days       Current Facility-Administered Medications on File Prior to Visit  Medication Dose Route Frequency Provider Last Rate Last Dose  . methylPREDNISolone acetate (DEPO-MEDROL) injection 40 mg  40 mg Intramuscular Once Stacie Glaze, MD        BP 120/80  Pulse 97  Temp(Src) 97.4 F (36.3 C) (Oral)  Resp 20  Wt 244 lb (110.678 kg)  BMI 44.62 kg/m2  SpO2 95%       Review of Systems  Constitutional: Negative.   HENT: Negative for hearing loss, congestion, sore throat, rhinorrhea, dental problem, sinus pressure and tinnitus.   Eyes: Negative for pain, discharge and visual disturbance.  Respiratory: Negative for cough and shortness of breath.   Cardiovascular: Negative for chest pain, palpitations and leg swelling.  Gastrointestinal: Negative for nausea, vomiting, abdominal pain, diarrhea, constipation, blood in stool and abdominal  distention.  Genitourinary: Negative for dysuria, urgency, frequency, hematuria, flank pain, vaginal bleeding, vaginal discharge,  difficulty urinating, vaginal pain and pelvic pain.  Musculoskeletal: Positive for back pain, joint swelling, arthralgias and gait problem.  Skin: Negative for rash.  Neurological: Negative for dizziness, syncope, speech difficulty, weakness, numbness and headaches.  Hematological: Negative for adenopathy.  Psychiatric/Behavioral: Negative for behavioral problems, dysphoric mood and agitation. The patient is not nervous/anxious.        Objective:   Physical Exam  Constitutional: She is oriented to person, place, and time. She appears well-developed and well-nourished.  Obese The pressure 120/78 Weight 244 O2 saturation 95  HENT:  Head: Normocephalic.  Right Ear: External ear normal.  Left Ear: External ear normal.  Mouth/Throat: Oropharynx is clear and moist.  Eyes: Conjunctivae and EOM are normal. Pupils are equal, round, and reactive to light.  Neck: Normal range of motion. Neck supple. No thyromegaly present.  Cardiovascular: Normal rate, regular rhythm, normal heart sounds and intact distal pulses.   Pulmonary/Chest: Effort normal and breath sounds normal.  Abdominal: Soft. Bowel sounds are normal. She exhibits no mass. There is no tenderness.  Musculoskeletal: Normal range of motion.  Right knee is slightly warm compared to the left No significant left popliteal pathology  Trace lower extremity edema  Lymphadenopathy:    She has no cervical adenopathy.  Neurological: She is alert and oriented to person, place, and time.  Skin: Skin is warm and dry. No rash noted.  Psychiatric: She has a normal mood and affect. Her behavior is normal.          Assessment & Plan:   Mild hypokalemia. Her potassium supplementation was increased to a twice a day regimen We'll decrease furosemide to 20 mg daily and take an extra 44 additional edema  control Diabetes mellitus. Per endocrinology Chronic atrial fibrillation. No evidence of heart failure Hypertension stable Multiple orthopedic complaints. Followup orthopedic surgery

## 2012-05-18 ENCOUNTER — Encounter: Payer: Self-pay | Admitting: Internal Medicine

## 2012-05-24 ENCOUNTER — Telehealth: Payer: Self-pay | Admitting: Internal Medicine

## 2012-05-24 NOTE — Telephone Encounter (Signed)
[facility] Triage Call Report Triage Record Num: [id number] Operator: Jeraldine Loots Patient Name: Kristen [name] Call Date & Time: 05/23/2012 5:14:36PM Patient Phone: [phone] PCP: Darryll Capers Patient Gender: Female PCP Fax : [phone] Patient DOB: 29-Aug-1945 [facility] Name[facility]  Reason for Call: Caller: [name]/Patient; PCP: Darryll Capers (Adults only); CB#: [phone]; Having cervical pain radiating into her left shoulder and arm. Used ice then heat and then removed all of it. Took Hydrocodone with no relief. Is out of her Flexeril. Triaged per Neck Pain. Needs to be seen in 72 hours. Attempted to schedule an appt. for Thursday am. She has to call and arrange transportation and will call back at a later time.  Protocol(s) Used: Neck Pain Recommended Outcome per Protocol: Call Provider within 72 Hours Reason for Outcome: Evaluated by provider and has question/concern about their condition, treatment plan, treatment options, follow-up appointments, or other follow-up care.

## 2012-06-01 ENCOUNTER — Ambulatory Visit: Payer: Medicare Other | Admitting: Cardiology

## 2012-06-04 ENCOUNTER — Ambulatory Visit (INDEPENDENT_AMBULATORY_CARE_PROVIDER_SITE_OTHER): Payer: Medicare Other | Admitting: Internal Medicine

## 2012-06-04 ENCOUNTER — Encounter: Payer: Self-pay | Admitting: Internal Medicine

## 2012-06-04 VITALS — BP 140/80 | HR 80 | Temp 98.3°F | Resp 18 | Ht 62.0 in | Wt 238.0 lb

## 2012-06-04 DIAGNOSIS — I10 Essential (primary) hypertension: Secondary | ICD-10-CM

## 2012-06-04 DIAGNOSIS — M1710 Unilateral primary osteoarthritis, unspecified knee: Secondary | ICD-10-CM

## 2012-06-04 MED ORDER — METHYLPREDNISOLONE ACETATE 40 MG/ML IJ SUSP: 40.0000 mg | Freq: Once | INTRAMUSCULAR | Status: DC

## 2012-06-04 NOTE — Patient Instructions (Signed)
The patient is instructed to continue all medications as prescribed. Schedule followup with check out clerk upon leaving the clinic  

## 2012-06-04 NOTE — Progress Notes (Signed)
Subjective:    Patient ID: Kristen [name], female    DOB: [birth year], 67 y.o.   MRN: [id number]  HPI The patient does not like the compression stocking Has lost 8 lbs in one month The patient has significantly improved edema! DM is better with diet and weight loss   Review of Systems  Constitutional: Positive for fatigue. Negative for activity change and appetite change.  HENT: Negative for ear pain, congestion, neck pain, postnasal drip and sinus pressure.   Eyes: Negative for redness and visual disturbance.  Respiratory: Positive for shortness of breath. Negative for cough and wheezing.   Cardiovascular:       Much improved edema  Gastrointestinal: Negative for abdominal pain and abdominal distention.  Genitourinary: Negative for dysuria, frequency and menstrual problem.  Musculoskeletal: Negative for myalgias, joint swelling and arthralgias.  Skin: Negative for rash and wound.  Neurological: Negative for dizziness, weakness and headaches.  Hematological: Negative for adenopathy. Does not bruise/bleed easily.  Psychiatric/Behavioral: Negative for sleep disturbance and decreased concentration.   Past Medical History  Diagnosis Date  . CAD (coronary artery disease)   . PAF (paroxysmal atrial fibrillation)   . Hypertension   . Hyperlipidemia   . GERD (gastroesophageal reflux disease)   . Hypokalemia   . Diabetes mellitus   . Depression   . Lumbar back pain   . LBBB (left bundle branch block)   . Headache   . Lymphadenitis   . Arthritis   . Bursitis   . Chronic anticoagulation   . Heart palpitations   . Diastolic dysfunction     per echo in October 2012 with EF 50 to 55%  . 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 GERD (gastroesophageal reflux disease) 10/23/2003  . Neuromuscular disorder     fibromyalgia  . Blood transfusion without reported diagnosis     History   Social History  . Marital Status: Legally Separated    Spouse Name: N/A    Number of  Children: N/A  . Years of Education: N/A   Occupational History  . Not on file.   Social History Main Topics  . Smoking status: Former Smoker    Quit date: 03/07/2001  . Smokeless tobacco: Never Used  . Alcohol Use: No  . Drug Use: No  . Sexually Active: Not on file   Other Topics Concern  . Not on file   Social History Narrative  . No narrative on file    Past Surgical History  Procedure Laterality Date  . Angioplasty  laminectomy  . Lumbar laminectomy      x3  . Coronary artery bypass graft      LIMA to LAD   . Abdominal hysterectomy    . Cholecystectomy    . Tonsillectomy    . Knee arthroscopy    . Sphincterotomy      Family History  Problem Relation Age of Onset  . Heart attack Father   . Heart disease Father   . Kidney disease    . Stroke    . Arthritis    . Hypertension    . Diabetes    . Stroke Mother   . Colon cancer Paternal Uncle     Allergies  Allergen Reactions  . Cleocin (Clindamycin Hcl) Diarrhea  . Codeine Itching  . Doxycycline Hyclate Diarrhea  . Morphine Hives  . Pentazocine Lactate Itching and Nausea And Vomiting  . Vibramycin (Doxycycline Calcium) Diarrhea  . Sulfonamide Derivatives Rash  Current Outpatient Prescriptions on File Prior to Visit  Medication Sig Dispense Refill  . ALPRAZolam (XANAX) 1 MG tablet TAKE 1 TABLET 3 TIMES A DAY AS NEEDED FOR GRIEF  90 tablet  3  . aspirin 81 MG tablet Take 81 mg by mouth daily.        Marland Kitchen atorvastatin (LIPITOR) 20 MG tablet TAKE 2 TABLETS BY MOUTH DAILY  60 tablet  11  . clotrimazole-betamethasone (LOTRISONE) cream Apply topically 2 (two) times daily.  45 g  0  . cyclobenzaprine (FLEXERIL) 10 MG tablet Take 10 mg by mouth 2 (two) times daily as needed. For muscle spasms      . digoxin (LANOXIN) 0.25 MG tablet TAKE 1 TABLET EVERY DAY  90 tablet  2  . furosemide (LASIX) 40 MG tablet Take 20 mg by mouth daily.      Marland Kitchen gabapentin (NEURONTIN) 300 MG capsule TAKE 1 CAPSULE THREE TIMES A DAY  90  capsule  2  . hydrochlorothiazide (MICROZIDE) 12.5 MG capsule TAKE ONE CAPSULE BY MOUTH EVERY DAY  90 capsule  0  . HYDROcodone-acetaminophen (NORCO) 10-325 MG per tablet Take 1 tablet by mouth every 4 (four) hours as needed for pain.  150 tablet  3  . hydrocortisone 2.5 % cream       . insulin detemir (LEVEMIR FLEXPEN) 100 UNIT/ML injection Inject 180 Units into the skin at bedtime.  60 mL  12  . Insulin Pen Needle 31G X 8 MM MISC Use as directed three times a day  300 each  2  . lansoprazole (PREVACID) 30 MG capsule Take 30 mg by mouth daily as needed. For heartburn      . Levomilnacipran HCl ER (FETZIMA) 40 MG CP24 Take 1 capsule by mouth daily.  30 capsule    . metoprolol succinate (TOPROL-XL) 100 MG 24 hr tablet Take 1 tablet (100 mg total) by mouth 2 (two) times daily. Take with or immediately following a meal.  60 tablet  6  . nitroGLYCERIN (NITROSTAT) 0.4 MG SL tablet Place 0.4 mg under the tongue every 5 (five) minutes as needed. For chest pain      . ONE TOUCH ULTRA TEST test strip USE 6 TIMES DAILY AS DIRECTED  200 each  2  . spironolactone (ALDACTONE) 25 MG tablet TAKE 1 TABLET (25 MG TOTAL) BY MOUTH 2 (TWO) TIMES DAILY.  60 tablet  6  . telmisartan (MICARDIS) 40 MG tablet Take 40 mg by mouth daily.        Marland Kitchen warfarin (COUMADIN) 6 MG tablet Take 3-6 mg by mouth daily. 6mg  on mondays 3 mg all other days      . potassium chloride SA (KLOR-CON M20) 20 MEQ tablet 1 tablet twice daily  180 tablet  5   Current Facility-Administered Medications on File Prior to Visit  Medication Dose Route Frequency Provider Last Rate Last Dose  . methylPREDNISolone acetate (DEPO-MEDROL) injection 40 mg  40 mg Intramuscular Once Stacie Glaze, MD        BP 140/80  Pulse 80  Temp(Src) 98.3 F (36.8 C)  Resp 18  Ht 5\' 2"  (1.575 m)  Wt 238 lb (107.956 kg)  BMI 43.52 kg/m2        Objective:   Physical Exam  Nursing note and vitals reviewed. Constitutional: She appears well-developed and  well-nourished.  HENT:  Head: Normocephalic and atraumatic.  Eyes: Conjunctivae are normal. Pupils are equal, round, and reactive to light.  Cardiovascular: Normal rate.  Murmur heard. Musculoskeletal: She exhibits tenderness. She exhibits no edema.  DJD of knees          Assessment & Plan:  A1C and b-met prior to next visit in 2-3 months  Informed consent obtained and the patient's knee(S) was prepped with betadine. Local anesthesia was obtained with topical spray. Then 40 mg of Depo-Medrol and 1/2 cc of lidocaine was injected into the joint space. The patient tolerated the procedure without complications. Post injection care discussed with patient.  HTN stable DM improved

## 2012-06-05 ENCOUNTER — Telehealth: Payer: Self-pay | Admitting: Internal Medicine

## 2012-06-05 NOTE — Telephone Encounter (Signed)
Patient Information:  Caller Name: Jaira  Phone: [phone]  Patient: Kristen [name]  Gender: Female  DOB: 1945/03/14  Age: 67 Years  PCP: Darryll Capers (Adults only)  Office Follow Up:  Does the office need to follow up with this patient?: Yes  Instructions For The Office: Please contact patient regarding elevated GBS post -steroid knee injection.  Patinet is aware her sugar would elevated.  she wants to know what to do for elevated sugars and headache.  please contact her on  cell [phone]  RN Note:  Please contact patient regarding elevated GBS post -steroid knee injection.  Patinet is aware her sugar would elevated.  she wants to know what to do for elevated sugars and headache.  please contact her on  cell [phone]  Symptoms  Reason For Call & Symptoms: Patient was seen in office yesterday . She had bilateral Knee injections for knee pain. Prednisone 40mg  IM.   She is a diabetic. GBS # 300 and #351.  She is currently having headache as well. Treating H/A Tylenol and Hydrocodone.  For her diabetes she takes Levemir 90 units in the morning and evening.  Reviewed Health History In EMR: Yes  Reviewed Medications In EMR: Yes  Reviewed Allergies In EMR: Yes  Reviewed Surgeries / Procedures: Yes  Date of Onset of Symptoms: 06/05/2012  Treatments Tried: Drinking water,  For Headache- Tylenol and Hydrocodone  Treatments Tried Worked: No  Guideline(s) Used:  Diabetes - High Blood Sugar  Disposition Per Guideline:   Discuss with PCP and Callback by Nurse within 1 Hour  Reason For Disposition Reached:   Blood glucose > 300 mg/dl (57.8 mmol/l) AND two or more times in a row  Advice Given:  Treatment - Liquids  Drink at least one glass (8 oz or 240 ml) of water per hour for the next 4 hours. (Reason: adequate hydration will reduce hyperglycemia).  Generally, you should try to drink 6-8 glasses of water each day.  Treatment - Insulin  Continue to take your insulin,  as prescribed by your doctor.  Call Back If:  Blood glucose more than 300 mg/dL (46.9 mmol/l), 2 or more times in a row.  Vomiting lasting more than 4 hours or unable to drink any liquids.  Rapid breathing occurs  You become worse.  Patient Will Follow Care Advice:  YES

## 2012-06-05 NOTE — Telephone Encounter (Signed)
Spoke with patient.

## 2012-06-05 NOTE — Telephone Encounter (Signed)
Per dr swords- take extra 30units of levimir now and drink plenty of fluids and no sugars or breads and try to limit carbs today an d it will get better

## 2012-06-13 ENCOUNTER — Ambulatory Visit (INDEPENDENT_AMBULATORY_CARE_PROVIDER_SITE_OTHER): Payer: Medicare Other | Admitting: Family

## 2012-06-13 DIAGNOSIS — Z7901 Long term (current) use of anticoagulants: Secondary | ICD-10-CM

## 2012-06-13 DIAGNOSIS — I4891 Unspecified atrial fibrillation: Secondary | ICD-10-CM

## 2012-06-13 LAB — POCT INR: INR: 1.8

## 2012-06-13 NOTE — Patient Instructions (Signed)
Take 1 whole tab today only. Then continue 6mg  on Monday and 3mg  all other days. Recheck in 4 weeks Anticoagulation Dose Instructions as of 06/13/2012     Glynis Smiles Tue Wed Thu Fri Sat   New Dose 3 mg 6 mg 3 mg 3 mg 3 mg 3 mg 3 mg    Description       Take 1 whole tab today only. Then continue 6mg  on Monday and 3mg  all other days. Recheck in 4 weeks

## 2012-06-14 ENCOUNTER — Telehealth: Payer: Self-pay | Admitting: Internal Medicine

## 2012-06-14 DIAGNOSIS — E1059 Type 1 diabetes mellitus with other circulatory complications: Secondary | ICD-10-CM

## 2012-06-14 NOTE — Telephone Encounter (Signed)
Pt would like a referral to the female endocrinologist that Dr. Lovell Sheehan had suggested she see at her last visit. Please assist.

## 2012-06-18 ENCOUNTER — Encounter: Payer: Self-pay | Admitting: Cardiology

## 2012-06-18 ENCOUNTER — Ambulatory Visit (INDEPENDENT_AMBULATORY_CARE_PROVIDER_SITE_OTHER): Payer: 59 | Admitting: Cardiology

## 2012-06-18 VITALS — BP 118/76 | HR 77 | Ht 62.0 in | Wt 235.0 lb

## 2012-06-18 DIAGNOSIS — Z8673 Personal history of transient ischemic attack (TIA), and cerebral infarction without residual deficits: Secondary | ICD-10-CM

## 2012-06-18 DIAGNOSIS — I447 Left bundle-branch block, unspecified: Secondary | ICD-10-CM

## 2012-06-18 DIAGNOSIS — I10 Essential (primary) hypertension: Secondary | ICD-10-CM

## 2012-06-18 DIAGNOSIS — I2510 Atherosclerotic heart disease of native coronary artery without angina pectoris: Secondary | ICD-10-CM

## 2012-06-18 MED ORDER — POTASSIUM CHLORIDE CRYS ER 20 MEQ PO TBCR: EXTENDED_RELEASE_TABLET | ORAL | Status: DC

## 2012-06-18 NOTE — Progress Notes (Signed)
HPI:  She is in for followup. She underwent colonoscopy without any difficulty whatsoever. She had a polyp removed. She's received a letter from the GI division. She denies any new symptoms. We reviewed her prior stroke history. In 2004 she had an episode, and was associated with difficulty speaking. I gave her copies of both her MRI, and her CT scan from just about the time. She's not having any specific cardiac problems the present time  Current Outpatient Prescriptions  Medication Sig Dispense Refill  . ALPRAZolam (XANAX) 1 MG tablet TAKE 1 TABLET 3 TIMES A DAY AS NEEDED FOR GRIEF  90 tablet  3  . aspirin 81 MG tablet Take 81 mg by mouth daily.        Marland Kitchen atorvastatin (LIPITOR) 20 MG tablet TAKE 2 TABLETS BY MOUTH DAILY  60 tablet  11  . clotrimazole-betamethasone (LOTRISONE) cream Apply topically 2 (two) times daily.  45 g  0  . cyclobenzaprine (FLEXERIL) 10 MG tablet Take 10 mg by mouth 2 (two) times daily as needed. For muscle spasms      . digoxin (LANOXIN) 0.25 MG tablet TAKE 1 TABLET EVERY DAY  90 tablet  2  . furosemide (LASIX) 40 MG tablet Take 20 mg by mouth daily.      Marland Kitchen gabapentin (NEURONTIN) 300 MG capsule TAKE 1 CAPSULE THREE TIMES A DAY  90 capsule  2  . hydrochlorothiazide (MICROZIDE) 12.5 MG capsule TAKE ONE CAPSULE BY MOUTH EVERY DAY  90 capsule  0  . HYDROcodone-acetaminophen (NORCO) 10-325 MG per tablet Take 1 tablet by mouth every 4 (four) hours as needed for pain.  150 tablet  3  . hydrocortisone 2.5 % cream       . insulin detemir (LEVEMIR FLEXPEN) 100 UNIT/ML injection Inject 180 Units into the skin at bedtime.  60 mL  12  . Insulin Pen Needle 31G X 8 MM MISC Use as directed three times a day  300 each  2  . lansoprazole (PREVACID) 30 MG capsule Take 30 mg by mouth daily as needed. For heartburn      . Levomilnacipran HCl ER (FETZIMA) 40 MG CP24 Take 1 capsule by mouth daily.  30 capsule    . metoprolol succinate (TOPROL-XL) 100 MG 24 hr tablet Take 1 tablet (100 mg  total) by mouth 2 (two) times daily. Take with or immediately following a meal.  60 tablet  6  . nitroGLYCERIN (NITROSTAT) 0.4 MG SL tablet Place 0.4 mg under the tongue every 5 (five) minutes as needed. For chest pain      . ONE TOUCH ULTRA TEST test strip USE 6 TIMES DAILY AS DIRECTED  200 each  2  . potassium chloride SA (KLOR-CON M20) 20 MEQ tablet 1 tablet twice daily  180 tablet  5  . spironolactone (ALDACTONE) 25 MG tablet TAKE 1 TABLET (25 MG TOTAL) BY MOUTH 2 (TWO) TIMES DAILY.  60 tablet  6  . telmisartan (MICARDIS) 40 MG tablet Take 40 mg by mouth daily.        Marland Kitchen warfarin (COUMADIN) 6 MG tablet Take 3-6 mg by mouth daily. 6mg  on mondays 3 mg all other days       Current Facility-Administered Medications  Medication Dose Route Frequency Provider Last Rate Last Dose  . methylPREDNISolone acetate (DEPO-MEDROL) injection 40 mg  40 mg Intramuscular Once Stacie Glaze, MD        Allergies  Allergen Reactions  . Cleocin (Clindamycin Hcl) Diarrhea  .  Codeine Itching  . Doxycycline Hyclate Diarrhea  . Morphine Hives  . Pentazocine Lactate Itching and Nausea And Vomiting  . Vibramycin (Doxycycline Calcium) Diarrhea  . Sulfonamide Derivatives Rash    Past Medical History  Diagnosis Date  . CAD (coronary artery disease)   . PAF (paroxysmal atrial fibrillation)   . Hypertension   . Hyperlipidemia   . GERD (gastroesophageal reflux disease)   . Hypokalemia   . Diabetes mellitus   . Depression   . Lumbar back pain   . LBBB (left bundle branch block)   . Headache   . Lymphadenitis   . Arthritis   . Bursitis   . Chronic anticoagulation   . Heart palpitations   . Diastolic dysfunction     per echo in October 2012 with EF 50 to 55%  . 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 GERD (gastroesophageal reflux disease) 10/23/2003  . Neuromuscular disorder     fibromyalgia  . Blood transfusion without reported diagnosis     Past Surgical History  Procedure Laterality Date  .  Angioplasty  laminectomy  . Lumbar laminectomy      x3  . Coronary artery bypass graft      LIMA to LAD   . Abdominal hysterectomy    . Cholecystectomy    . Tonsillectomy    . Knee arthroscopy    . Sphincterotomy      Family History  Problem Relation Age of Onset  . Heart attack Father   . Heart disease Father   . Kidney disease    . Stroke    . Arthritis    . Hypertension    . Diabetes    . Stroke Mother   . Colon cancer Paternal Uncle     History   Social History  . Marital Status: Legally Separated    Spouse Name: N/A    Number of Children: N/A  . Years of Education: N/A   Occupational History  . Not on file.   Social History Main Topics  . Smoking status: Former Smoker    Quit date: 03/07/2001  . Smokeless tobacco: Never Used  . Alcohol Use: No  . Drug Use: No  . Sexually Active: Not on file   Other Topics Concern  . Not on file   Social History Narrative  . No narrative on file    ROS: Please see the HPI.  All other systems reviewed and negative.  PHYSICAL EXAM:  BP 118/76  Pulse 77  Ht 5\' 2"  (1.575 m)  Wt 235 lb (106.595 kg)  BMI 42.97 kg/m2  SpO2 98%  General: Well developed, well nourished, in no acute distress. Head:  Normocephalic and atraumatic. Neck: no JVD Lungs: Clear to auscultation and percussion. Heart: Normal S1 and S2.  Paradoxical split of S2.   Abdomen:  Normal bowel sounds; soft; non tender; no organomegaly Pulses: Pulses normal in all 4 extremities. Extremities: No clubbing or cyanosis. No edema. Neurologic: Alert and oriented x 3.  EKG:  NSR.  LBBB.    ASSESSMENT AND PLAN:

## 2012-06-18 NOTE — Patient Instructions (Addendum)
Your physician wants you to follow-up in: 6 months with Dr. McAlhany.  You will receive a reminder letter in the mail two months in advance. If you don't receive a letter, please call our office to schedule the follow-up appointment.  

## 2012-06-18 NOTE — Progress Notes (Signed)
HPI:  Current Outpatient Prescriptions  Medication Sig Dispense Refill  . ALPRAZolam (XANAX) 1 MG tablet TAKE 1 TABLET 3 TIMES A DAY AS NEEDED FOR GRIEF  90 tablet  3  . aspirin 81 MG tablet Take 81 mg by mouth daily.        Marland Kitchen atorvastatin (LIPITOR) 20 MG tablet TAKE 2 TABLETS BY MOUTH DAILY  60 tablet  11  . clotrimazole-betamethasone (LOTRISONE) cream Apply topically 2 (two) times daily.  45 g  0  . cyclobenzaprine (FLEXERIL) 10 MG tablet Take 10 mg by mouth 2 (two) times daily as needed. For muscle spasms      . digoxin (LANOXIN) 0.25 MG tablet TAKE 1 TABLET EVERY DAY  90 tablet  2  . furosemide (LASIX) 40 MG tablet Take 20 mg by mouth daily.      Marland Kitchen gabapentin (NEURONTIN) 300 MG capsule TAKE 1 CAPSULE THREE TIMES A DAY  90 capsule  2  . hydrochlorothiazide (MICROZIDE) 12.5 MG capsule TAKE ONE CAPSULE BY MOUTH EVERY DAY  90 capsule  0  . HYDROcodone-acetaminophen (NORCO) 10-325 MG per tablet Take 1 tablet by mouth every 4 (four) hours as needed for pain.  150 tablet  3  . hydrocortisone 2.5 % cream       . insulin detemir (LEVEMIR FLEXPEN) 100 UNIT/ML injection Inject 180 Units into the skin at bedtime.  60 mL  12  . Insulin Pen Needle 31G X 8 MM MISC Use as directed three times a day  300 each  2  . lansoprazole (PREVACID) 30 MG capsule Take 30 mg by mouth daily as needed. For heartburn      . Levomilnacipran HCl ER (FETZIMA) 40 MG CP24 Take 1 capsule by mouth daily.  30 capsule    . metoprolol succinate (TOPROL-XL) 100 MG 24 hr tablet Take 1 tablet (100 mg total) by mouth 2 (two) times daily. Take with or immediately following a meal.  60 tablet  6  . nitroGLYCERIN (NITROSTAT) 0.4 MG SL tablet Place 0.4 mg under the tongue every 5 (five) minutes as needed. For chest pain      . ONE TOUCH ULTRA TEST test strip USE 6 TIMES DAILY AS DIRECTED  200 each  2  . potassium chloride SA (KLOR-CON M20) 20 MEQ tablet 1 tablet twice daily  180 tablet  5  . spironolactone (ALDACTONE) 25 MG tablet  TAKE 1 TABLET (25 MG TOTAL) BY MOUTH 2 (TWO) TIMES DAILY.  60 tablet  6  . telmisartan (MICARDIS) 40 MG tablet Take 40 mg by mouth daily.        Marland Kitchen warfarin (COUMADIN) 6 MG tablet Take 3-6 mg by mouth daily. 6mg  on mondays 3 mg all other days       Current Facility-Administered Medications  Medication Dose Route Frequency Provider Last Rate Last Dose  . methylPREDNISolone acetate (DEPO-MEDROL) injection 40 mg  40 mg Intramuscular Once Stacie Glaze, MD        Allergies  Allergen Reactions  . Cleocin (Clindamycin Hcl) Diarrhea  . Codeine Itching  . Doxycycline Hyclate Diarrhea  . Morphine Hives  . Pentazocine Lactate Itching and Nausea And Vomiting  . Vibramycin (Doxycycline Calcium) Diarrhea  . Sulfonamide Derivatives Rash    Past Medical History  Diagnosis Date  . CAD (coronary artery disease)   . PAF (paroxysmal atrial fibrillation)   . Hypertension   . Hyperlipidemia   . GERD (gastroesophageal reflux disease)   . Hypokalemia   . Diabetes  mellitus   . Depression   . Lumbar back pain   . LBBB (left bundle branch block)   . Headache   . Lymphadenitis   . Arthritis   . Bursitis   . Chronic anticoagulation   . Heart palpitations   . Diastolic dysfunction     per echo in October 2012 with EF 50 to 55%  . 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 GERD (gastroesophageal reflux disease) 10/23/2003  . Neuromuscular disorder     fibromyalgia  . Blood transfusion without reported diagnosis     Past Surgical History  Procedure Laterality Date  . Angioplasty  laminectomy  . Lumbar laminectomy      x3  . Coronary artery bypass graft      LIMA to LAD   . Abdominal hysterectomy    . Cholecystectomy    . Tonsillectomy    . Knee arthroscopy    . Sphincterotomy      Family History  Problem Relation Age of Onset  . Heart attack Father   . Heart disease Father   . Kidney disease    . Stroke    . Arthritis    . Hypertension    . Diabetes    . Stroke Mother   . Colon  cancer Paternal Uncle     History   Social History  . Marital Status: Legally Separated    Spouse Name: N/A    Number of Children: N/A  . Years of Education: N/A   Occupational History  . Not on file.   Social History Main Topics  . Smoking status: Former Smoker    Quit date: 03/07/2001  . Smokeless tobacco: Never Used  . Alcohol Use: No  . Drug Use: No  . Sexually Active: Not on file   Other Topics Concern  . Not on file   Social History Narrative  . No narrative on file    ROS: Please see the HPI.  All other systems reviewed and negative.  PHYSICAL EXAM:  BP 118/76  Pulse 77  Ht 5\' 2"  (1.575 m)  Wt 235 lb (106.595 kg)  BMI 42.97 kg/m2  SpO2 98%  General: Well developed, well nourished, in no acute distress. Head:  Normocephalic and atraumatic. Neck: no JVD Lungs: Clear to auscultation and percussion. Heart: Normal S1 and S2.  No murmur, rubs or gallops.  Abdomen:  Normal bowel sounds; soft; non tender; no organomegaly Pulses: Pulses normal in all 4 extremities. Extremities: No clubbing or cyanosis. No edema. Neurologic: Alert and oriented x 3.  EKG:  ASSESSMENT AND PLAN:

## 2012-06-19 ENCOUNTER — Telehealth: Payer: Self-pay | Admitting: Cardiology

## 2012-06-19 LAB — BASIC METABOLIC PANEL
CO2: 23 mEq/L (ref 19–32)
Calcium: 9.3 mg/dL (ref 8.4–10.5)
Chloride: 100 mEq/L (ref 96–112)
Creatinine, Ser: 0.9 mg/dL (ref 0.4–1.2)
Sodium: 137 mEq/L (ref 135–145)

## 2012-06-19 NOTE — Telephone Encounter (Signed)
New problem    Pt need to talk to nurse concerning Dr Riley Kill telling her she can come of blood thinner. Pt is taking Warfarin. She was confused if he said she can come off or she can't come off medication. Please call pt because she was confused what he actually said.

## 2012-06-19 NOTE — Telephone Encounter (Signed)
Per Dr Riley Kill the pt cannot stop Coumadin. I spoke with the pt and made her aware of this information.  The pt was inquiring because she may need to have knee surgery in the future.  I made the pt aware that if surgery was needed then we would handle questions about anticoagulation at that time. Pt verbalized understanding.

## 2012-06-21 NOTE — Assessment & Plan Note (Addendum)
Not currently having anginal symptoms.

## 2012-06-21 NOTE — Assessment & Plan Note (Addendum)
This was reviewed with patient by extracting prior MRI and CT data.  Last CT did not demonstrate, but initial MR did.  It was felt at that time that she should be on anticoagulation.  Difficult as EF is now improved.  However, she had major event at that time and has done well on chronic anticoagulation.  She will need continuing follow up at this juncture.

## 2012-06-21 NOTE — Assessment & Plan Note (Signed)
Chronically noted.

## 2012-06-21 NOTE — Assessment & Plan Note (Signed)
Well controlled.  Need for lab follow up given medications stressed.  Long term management is with Darryll Capers.

## 2012-07-06 ENCOUNTER — Ambulatory Visit: Payer: 59 | Admitting: Internal Medicine

## 2012-07-11 ENCOUNTER — Encounter: Payer: 59 | Admitting: Family

## 2012-07-13 ENCOUNTER — Ambulatory Visit (INDEPENDENT_AMBULATORY_CARE_PROVIDER_SITE_OTHER): Payer: 59 | Admitting: Family

## 2012-07-13 DIAGNOSIS — Z7901 Long term (current) use of anticoagulants: Secondary | ICD-10-CM

## 2012-07-13 DIAGNOSIS — I4891 Unspecified atrial fibrillation: Secondary | ICD-10-CM

## 2012-07-13 LAB — POCT INR: INR: 2.3

## 2012-07-13 NOTE — Patient Instructions (Addendum)
Continue 6mg  on Monday and 3mg  all other days. Recheck in 4 weeks  Anticoagulation Dose Instructions as of 07/13/2012     Glynis Smiles Tue Wed Thu Fri Sat   New Dose 3 mg 6 mg 3 mg 3 mg 3 mg 3 mg 3 mg    Description       Take 1 whole tab today only. Then continue 6mg  on Monday and 3mg  all other days. Recheck in 4 weeks

## 2012-07-17 ENCOUNTER — Telehealth: Payer: Self-pay | Admitting: Internal Medicine

## 2012-07-17 NOTE — Telephone Encounter (Signed)
Pt informed that we do have some pens

## 2012-07-17 NOTE — Telephone Encounter (Signed)
Pt need samples of levemir flexpens or bottles

## 2012-07-22 ENCOUNTER — Other Ambulatory Visit: Payer: Self-pay | Admitting: Internal Medicine

## 2012-07-23 ENCOUNTER — Telehealth: Payer: Self-pay | Admitting: Internal Medicine

## 2012-07-23 ENCOUNTER — Encounter: Payer: Self-pay | Admitting: Internal Medicine

## 2012-07-23 ENCOUNTER — Ambulatory Visit (INDEPENDENT_AMBULATORY_CARE_PROVIDER_SITE_OTHER): Payer: 59 | Admitting: Internal Medicine

## 2012-07-23 ENCOUNTER — Other Ambulatory Visit: Payer: Self-pay | Admitting: Cardiology

## 2012-07-23 VITALS — BP 122/70 | HR 100 | Temp 97.9°F | Resp 12 | Ht 62.0 in | Wt 236.5 lb

## 2012-07-23 DIAGNOSIS — E1059 Type 1 diabetes mellitus with other circulatory complications: Secondary | ICD-10-CM

## 2012-07-23 MED ORDER — METFORMIN HCL 500 MG PO TABS: 1000.0000 mg | ORAL_TABLET | Freq: Two times a day (BID) | ORAL | Status: DC

## 2012-07-23 MED ORDER — INSULIN DETEMIR 100 UNIT/ML ~~LOC~~ SOLN: SUBCUTANEOUS | Status: DC

## 2012-07-23 NOTE — Telephone Encounter (Signed)
Patient Information:  Caller Name: [name]  Phone: [phone]  Patient: Kristen [name], Kristen [name]  Gender: Female  DOB: September 05, 1945  Age: 67 Years  PCP: Darryll Capers (Adults only)  Office Follow Up:  Does the office need to follow up with this patient?: No  Instructions For The Office: N/A  RN Note:  Has had increase in neck pain in past 2 weeks.  Has temporary relief with narcotics, uses heat/ice to area.  At times pain runs into back of neck to back of head causing a headache.  Is hoping something can be done to take down the inflammation- feels like she is on enough medications.  Pain is mainly on left side starting in middle of neck.  Rates pain as 8/10 pain scale.  Is doing much of the care advice already.  Appointment scheduled with Lubertha Sayres on 07/24/12 at 14:15.  Dr. Lovell Sheehan is unavailable.  Symptoms  Reason For Call & Symptoms: Neck pain  Reviewed Health History In EMR: Yes  Reviewed Medications In EMR: Yes  Reviewed Allergies In EMR: Yes  Reviewed Surgeries / Procedures: Yes  Date of Onset of Symptoms: 07/09/2012  Treatments Tried: Has tried comfort measures  Treatments Tried Worked: No  Guideline(s) Used:  Neck Pain or Stiffness  Disposition Per Guideline:   See Within 3 Days in Office  Reason For Disposition Reached:   Neck pain persists > 2 weeks  Advice Given:  Apply Cold to the Area Or Heat:   During the first 2 days after a mild injury, apply a cold pack or an ice bag (wrapped in a towel) for 20 minutes four times a day. After 2 days, apply a heating pad or hot water bottle to the most painful area for 20 minutes whenever the pain flares up. Wrap hot water bottles or heating pads in a towel to avoid burns.  Sleep:  Sleep on your back or side, not on your abdomen.  Sleep with a neck collar. Use a foam neck collar (from a pharmacy) OR a small towel wrapped around the neck (Reason: keep the head from moving too much during sleep).  Stretching Exercises:  Improve the tone of the neck muscles with 2 or 3 minutes of gentle stretching exercises per day such as touching the chin to each shoulder, touching the ear to each shoulder, and moving the head forward and backward.  Avoid:   Avoid triggers that overstress the neck such as working with the neck turned or bent backward, carrying heavy objects on the head, carrying heavy objects with one arm (instead of both arms), standing on the head, contact sports or even friendly wrestling.  Call Back If:  Numbness or weakness occurs  Bowel or bladder problems occur  Pain lasts for more than 2 weeks  You become worse.  Patient Will Follow Care Advice:  YES  Appointment Scheduled:  07/24/2012 14:15:00 Appointment Scheduled Provider:  Adline Mango [facility])

## 2012-07-23 NOTE — Patient Instructions (Signed)
Please decrease Levemir to 70 units twice a day. Please start Metformin 500 mg with dinner x 5 days. If you tolerate this well, add another Metformin tablet (500 mg) with breakfast x 5 days. If you tolerate this well, at another metformin tablet with dinner (1000 mg) x 5 days. If you tolerate this well, had another metformin tablets with breakfast (1000 mg). Continue with 1000 mg of metformin twice a day with breakfast and dinner. Return in 1 month with your sugar log - continue to check 2-3 x a day.

## 2012-07-23 NOTE — Progress Notes (Signed)
Patient ID: Kristen [name], female   DOB: 04/02/1945, 67 y.o..o.   MRN: [id number]  HPI: [name] is a 67 y.o.-year-old femalemale, referred by her PCP, Dr.Jenkins, for management of DM2, insulin-dependent, uncontrolled, with complications (CAD, PAD, h/o CVD, peripheral neuropathy). She saw Dr. Everardo All in the past, last visit to 04/06/2012. She would like to follow up with me.  Patient has been diagnosed with diabetes in ~2008; she started insulin in ~2009. Last hemoglobin A1c was: Lab Results  Component Value Date   HGBA1C 8.1* 05/07/2012    Pt is on a regimen of: - Levemir pen 90 units bid - she is not affording Levemir and gets samples from PCP, but this will be solved soon as she gets the med directly from [facility] She was on Bydureon in the past, then also on Novolog 60-70-75. She has been on Metformin in the past, tolerated it well, then taken off.  Pt checks her sugars 2-3 a day and they are: - am: recently had a snack late at night (PB cracker) >> am sugars 160's, but usually 90-115 - before lunch: 120-140 - bedtime: 110-120  No lows. Lowest sugar was 60s, mostly in the evenings, when delays dinner; she has hypoglycemia awareness at 100. Highest sugar was 351, after cortisone inj (in knees).  Pt's meals are: - Breakfast: bran cereal only or scrambled eggs - Lunch: chicken or chicken salad, green beans, or tuna salad with 4 crackers - Dinner: salmon + vegetables, pork chop + green beans - Snacks: peanut butter and crackers, cookies, cake - but not daily She tries to use less bread.   Pt does not have chronic kidney disease, last BUN/creatinine was:  Lab Results  Component Value Date   BUN 15 06/18/2012   CREATININE 0.9 06/18/2012   Last set of lipids: Lab Results  Component Value Date   CHOL 187 02/28/2011   HDL 35.80* 02/28/2011   LDLCALC 118* 02/28/2011   LDLDIRECT 116.0 11/28/2008   TRIG 165.0* 02/28/2011   CHOLHDL 5 02/28/2011   Pt's last eye exam was  in last year. Reportingly no DR. Denies numbness and tingling in her legs.  I reviewed her chart and she also has a history of hypertension, paroxysmal A. Fib-on anticoagulation, depression, osteoarthritis - status post arthroscopic knee surgeries x2, status post lumbar laminectomy x3, hyperlipidemia, morbid obesity, chronic lymphadenitis, GERD, hypothyroidism - last TSH 2 mo ago 1.63, headaches, fibromyalgia.  For the last 2 weeks, she started to develop neck pain >> takes hydrocodone.  Pt has FH of DM in aunts and uncle - all on her mother's side.  ROS: Constitutional: fatigue, increased appetite, weight gain, feeling excessively hot, hot flashes, poor sleep Eyes: no blurry vision, no xerophthalmia ENT: no sore throat, no nodules palpated in throat, no dysphagia/odynophagia, no hoarseness Cardiovascular: no CP/+SOB/+ palpitations/+ leg swelling Respiratory: no cough/+SOB Gastrointestinal: no N/V/D/+ C Musculoskeletal: + muscle/+ joint aches, + pain in neck (dorsal cervical region) Skin: no rashes, + thinning hair Neurological: no tremors/numbness/tingling/dizziness, + headaches Psychiatric: no depression/anxiety Low libido  Past Medical History  Diagnosis Date  . CAD (coronary artery disease)   . PAF (paroxysmal atrial fibrillation)   . Hypertension   . Hyperlipidemia   . GERD (gastroesophageal reflux disease)   . Hypokalemia   . Diabetes mellitus   . Depression   . Lumbar back pain   . LBBB (left bundle branch block)   . Headache   . Lymphadenitis   . Arthritis   . Bursitis   .  Chronic anticoagulation   . Heart palpitations   . Diastolic dysfunction     per echo in October 2012 with EF 50 to 55%  . 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 GERD (gastroesophageal reflux disease) 10/23/2003  . Neuromuscular disorder     fibromyalgia  . Blood transfusion without reported diagnosis     Past Surgical History  Procedure Laterality Date  . Angioplasty  laminectomy  .  Lumbar laminectomy      x3  . Coronary artery bypass graft      LIMA to LAD   . Abdominal hysterectomy    . Cholecystectomy    . Tonsillectomy    . Knee arthroscopy    . Sphincterotomy     History   Social History  . Marital Status: Legally Separated    Spouse Name: N/A    Number of Children: 2: 20 and 32 y/o    Occupational History  . Disabled   Social History Main Topics  . Smoking status: Former Smoker    Quit date: 03/07/2001  . Smokeless tobacco: Never Used  . Alcohol Use: No  . Drug Use: No   Current Outpatient Prescriptions on File Prior to Visit  Medication Sig Dispense Refill  . ALPRAZolam (XANAX) 1 MG tablet TAKE 1 TABLET 3 TIMES A DAY AS NEEDED FOR GRIEF  90 tablet  3  . aspirin 81 MG tablet Take 81 mg by mouth daily.        Marland Kitchen atorvastatin (LIPITOR) 20 MG tablet TAKE 2 TABLETS BY MOUTH DAILY  60 tablet  11  . clotrimazole-betamethasone (LOTRISONE) cream Apply topically 2 (two) times daily.  45 g  0  . cyclobenzaprine (FLEXERIL) 10 MG tablet Take 10 mg by mouth 2 (two) times daily as needed. For muscle spasms      . digoxin (LANOXIN) 0.25 MG tablet TAKE 1 TABLET EVERY DAY  90 tablet  2  . furosemide (LASIX) 40 MG tablet Take 20 mg by mouth daily.      Marland Kitchen gabapentin (NEURONTIN) 300 MG capsule TAKE 1 CAPSULE THREE TIMES A DAY  90 capsule  2  . hydrochlorothiazide (MICROZIDE) 12.5 MG capsule TAKE ONE CAPSULE BY MOUTH EVERY DAY  90 capsule  0  . HYDROcodone-acetaminophen (NORCO) 10-325 MG per tablet Take 1 tablet by mouth every 4 (four) hours as needed for pain.  150 tablet  3  . hydrocortisone 2.5 % cream       . Insulin Pen Needle 31G X 8 MM MISC Use as directed three times a day  300 each  2  . lansoprazole (PREVACID) 30 MG capsule Take 30 mg by mouth daily as needed. For heartburn      . Levomilnacipran HCl ER (FETZIMA) 40 MG CP24 Take 1 capsule by mouth daily.  30 capsule    . metoprolol succinate (TOPROL-XL) 100 MG 24 hr tablet Take 1 tablet (100 mg total) by  mouth 2 (two) times daily. Take with or immediately following a meal.  60 tablet  6  . nitroGLYCERIN (NITROSTAT) 0.4 MG SL tablet Place 0.4 mg under the tongue every 5 (five) minutes as needed. For chest pain      . ONE TOUCH ULTRA TEST test strip USE 6 TIMES DAILY AS DIRECTED  200 each  2  . potassium chloride SA (KLOR-CON M20) 20 MEQ tablet 1 tablet twice daily  60 tablet  3  . spironolactone (ALDACTONE) 25 MG tablet TAKE 1 TABLET (25 MG  TOTAL) BY MOUTH 2 (TWO) TIMES DAILY.  60 tablet  6  . telmisartan (MICARDIS) 40 MG tablet Take 40 mg by mouth daily.        Marland Kitchen warfarin (COUMADIN) 6 MG tablet Take 3-6 mg by mouth daily. 6mg  on mondays 3 mg all other days       Current Facility-Administered Medications on File Prior to Visit  Medication Dose Route Frequency Provider Last Rate Last Dose  . methylPREDNISolone acetate (DEPO-MEDROL) injection 40 mg  40 mg Intramuscular Once Stacie Glaze, MD       Allergies  Allergen Reactions  . Cleocin (Clindamycin Hcl) Diarrhea  . Codeine Itching  . Doxycycline Hyclate Diarrhea  . Morphine Hives  . Pentazocine Lactate Itching and Nausea And Vomiting  . Vibramycin (Doxycycline Calcium) Diarrhea  . Sulfonamide Derivatives Rash   Family History  Problem Relation Age of Onset  . Heart attack Father   . Heart disease Father   . Kidney disease    . Stroke    . Arthritis    . Hypertension    . Diabetes    . Stroke Mother   . Colon cancer Paternal Uncle   Grandson died at 29 y/o from sarcoma  PE: BP 122/70  Pulse 100  Temp(Src) 97.9 F (36.6 C) (Oral)  Resp 12  Ht 5\' 2"  (1.575 m)  Wt 236 lb 8 oz (107.276 kg)  BMI 43.25 kg/m2  SpO2 97% Wt Readings from Last 3 Encounters:  07/23/12 236 lb 8 oz (107.276 kg)  06/18/12 235 lb (106.595 kg)  06/04/12 238 lb (107.956 kg)   Constitutional: overweight, in NAD, full supraclavicular fat pads Eyes: PERRLA, EOMI, no exophthalmos ENT: moist mucous membranes, no thyromegaly, no cervical  lymphadenopathy Cardiovascular: RRR, No MRG Respiratory: CTA B Gastrointestinal: abdomen soft, NT, ND, BS+ Musculoskeletal: no deformities, strength intact in all 4 Skin: moist, warm, acanthosis nigricans  Neurological: no tremor with outstretched hands, DTR normal in all 4  ASSESSMENT: 1. DM2, insulin-dependent, uncontrolled, with complications - CAD- Echo 12/2010: EF 50-55%, status post CABG 2004: LIMA to LAD - cards: Dr. Riley Kill. Also has a LBBB.  - PAD - h/o stroke - peripheral neuropathy - on neurontin  PLAN:  1. Patient with a six-year history of diabetes, on high-dose of basal insulin, which sugars improving. However, she is afraid that she can drop her sugars and usually has to eat a snack before bedtime to avoid this. She cannot remember why she was taken off metformin or Bydureon.  - We discussed about the fact that adding metformin will help with maintaining her weight and allowing [place] to decrease the dose of her insulin and also to avoid large fluctuations in her blood sugars. Therefore, I suggested that she starts metformin at low dose and advance to a target dose of 1000 mg twice a day.  - I would also decrease the dose of her Levemir from 90 units twice a day to 70 units twice a day - I hope we can decrease this even further in the future, for now, I advised her to start writing down her sugars and bring them at the next appointment in a month - we did not discuss a lot about diet, I plan to do this at the next visit - given sugar log and advised how to fill it and to bring it at next appt - check sugars up to 3 times a day - given foot care handout and explained the principles - given instructions  for hypoglycemia management "15-15 rule" - no labs for today

## 2012-07-24 ENCOUNTER — Encounter: Payer: Self-pay | Admitting: Family

## 2012-07-24 ENCOUNTER — Ambulatory Visit (INDEPENDENT_AMBULATORY_CARE_PROVIDER_SITE_OTHER): Payer: 59 | Admitting: Family

## 2012-07-24 VITALS — BP 116/66 | HR 95 | Wt 235.8 lb

## 2012-07-24 DIAGNOSIS — M1990 Unspecified osteoarthritis, unspecified site: Secondary | ICD-10-CM

## 2012-07-24 DIAGNOSIS — M47812 Spondylosis without myelopathy or radiculopathy, cervical region: Secondary | ICD-10-CM

## 2012-07-24 DIAGNOSIS — M542 Cervicalgia: Secondary | ICD-10-CM

## 2012-07-24 MED ORDER — CELECOXIB 200 MG PO CAPS: 200.0000 mg | ORAL_CAPSULE | Freq: Two times a day (BID) | ORAL | Status: DC

## 2012-07-24 MED ORDER — NITROGLYCERIN 0.4 MG SL SUBL: 0.4000 mg | SUBLINGUAL_TABLET | SUBLINGUAL | Status: DC | PRN

## 2012-07-24 NOTE — Patient Instructions (Addendum)
Osteoarthritis Osteoarthritis is the most common form of arthritis. It is redness, soreness, and swelling (inflammation) affecting the cartilage. Cartilage acts as a cushion, covering the ends of bones where they meet to form a joint. CAUSES  Over time, the cartilage begins to wear away. This causes bone to rub on bone. This produces pain and stiffness in the affected joints. Factors that contribute to this problem are:  Excessive body weight.  Age.  Overuse of joints. SYMPTOMS   People with osteoarthritis usually experience joint pain, swelling, or stiffness.  Over time, the joint may lose its normal shape.  Small deposits of bone (osteophytes) may grow on the edges of the joint.  Bits of bone or cartilage can break off and float inside the joint space. This may cause more pain and damage.  Osteoarthritis can lead to depression, anxiety, feelings of helplessness, and limitations on daily activities. The most commonly affected joints are in the:  Ends of the fingers.  Thumbs.  Neck.  Lower back.  Knees.  Hips. DIAGNOSIS  Diagnosis is mostly based on your symptoms and exam. Tests may be helpful, including:  X-rays of the affected joint.  A computerized magnetic scan (MRI).  Blood tests to rule out other types of arthritis.  Joint fluid tests. This involves using a needle to draw fluid from the joint and examining the fluid under a microscope. TREATMENT  Goals of treatment are to control pain, improve joint function, maintain a normal body weight, and maintain a healthy lifestyle. Treatment approaches may include:  A prescribed exercise program with rest and joint relief.  Weight control with nutritional education.  Pain relief techniques such as:  Properly applied heat and cold.  Electric pulses delivered to nerve endings under the skin (transcutaneous electrical nerve stimulation, TENS).  Massage.  Certain supplements. Ask your caregiver before using any  supplements, especially in combination with prescribed drugs.  Medicines to control pain, such as:  Acetaminophen.  Nonsteroidal anti-inflammatory drugs (NSAIDs), such as naproxen.  Narcotic or central-acting agents, such as tramadol. This drug carries a risk of addiction and is generally prescribed for short-term use.  Corticosteroids. These can be given orally or as injection. This is a short-term treatment, not recommended for routine use.  Surgery to reposition the bones and relieve pain (osteotomy) or to remove loose pieces of bone and cartilage. Joint replacement may be needed in advanced states of osteoarthritis. HOME CARE INSTRUCTIONS  Your caregiver can recommend specific types of exercise. These may include:  Strengthening exercises. These are done to strengthen the muscles that support joints affected by arthritis. They can be performed with weights or with exercise bands to add resistance.  Aerobic activities. These are exercises, such as brisk walking or low-impact aerobics, that get your heart pumping. They can help keep your lungs and circulatory system in shape.  Range-of-motion activities. These keep your joints limber.  Balance and agility exercises. These help you maintain daily living skills. Learning about your condition and being actively involved in your care will help improve the course of your osteoarthritis. SEEK MEDICAL CARE IF:   You feel hot or your skin turns red.  You develop a rash in addition to your joint pain.  You have an oral temperature above 102 F (38.9 C). FOR MORE INFORMATION  National Institute of Arthritis and Musculoskeletal and Skin Diseases: www.niams.nih.gov National Institute on Aging: www.nia.nih.gov American College of Rheumatology: www.rheumatology.org Document Released: 02/21/2005 Document Revised: 05/16/2011 Document Reviewed: 06/04/2009 [employer] Patient Information 2013 [employer].  

## 2012-07-24 NOTE — Progress Notes (Signed)
Subjective:    Patient ID: Kristen [name], female    DOB: 1945/03/19, 67 y.o.   MRN: [id number]  HPI 67 year old [place] female, nonsmoker, patient of Dr. Lovell Sheehan is in today with neck pain. The neck pain is particularly worse with turning her head to the left and and and with sitting at the computer. The pain has been ongoing several months but worse over the last couple days. She rates the pain 8/10. She's been taking hydrocodone that helps for about 2-3 hours and then the pain returns. Per her last CT scan and x-ray she has advanced osteoarthritis in the neck. She is on long-term Coumadin patient related to atrial fibrillation. She has type 2 diabetes being managed by endocrinology and her blood sugars typically range in the 180s to low 200s. Endocrinologist recently started metformin twice a day along with her Levemir.   Review of Systems  Constitutional: Negative.   HENT: Positive for neck pain. Negative for congestion, sore throat and sinus pressure.   Respiratory: Negative.   Cardiovascular: Negative.   Gastrointestinal: Negative.   Musculoskeletal: Negative for joint swelling and gait problem.       Neck pain  Neurological: Negative.  Negative for light-headedness.  Hematological: Negative.    Past Medical History  Diagnosis Date  . CAD (coronary artery disease)   . PAF (paroxysmal atrial fibrillation)   . Hypertension   . Hyperlipidemia   . GERD (gastroesophageal reflux disease)   . Hypokalemia   . Diabetes mellitus   . Depression   . Lumbar back pain   . LBBB (left bundle branch block)   . Headache   . Lymphadenitis   . Arthritis   . Bursitis   . Chronic anticoagulation   . Heart palpitations   . Diastolic dysfunction     per echo in October 2012 with EF 50 to 55%  . 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 GERD (gastroesophageal reflux disease) 10/23/2003  . Neuromuscular disorder     fibromyalgia  . Blood transfusion without reported diagnosis      History   Social History  . Marital Status: Legally Separated    Spouse Name: N/A    Number of Children: N/A  . Years of Education: N/A   Occupational History  . Not on file.   Social History Main Topics  . Smoking status: Former Smoker    Quit date: 03/07/2001  . Smokeless tobacco: Never Used  . Alcohol Use: No  . Drug Use: No  . Sexually Active: Not on file   Other Topics Concern  . Not on file   Social History Narrative  . No narrative on file    Past Surgical History  Procedure Laterality Date  . Angioplasty  laminectomy  . Lumbar laminectomy      x3  . Coronary artery bypass graft      LIMA to LAD   . Abdominal hysterectomy    . Cholecystectomy    . Tonsillectomy    . Knee arthroscopy    . Sphincterotomy      Family History  Problem Relation Age of Onset  . Heart attack Father   . Heart disease Father   . Kidney disease    . Stroke    . Arthritis    . Hypertension    . Diabetes    . Stroke Mother   . Colon cancer Paternal Uncle     Allergies  Allergen Reactions  . Cleocin (Clindamycin Hcl)  Diarrhea  . Codeine Itching  . Doxycycline Hyclate Diarrhea  . Morphine Hives  . Pentazocine Lactate Itching and Nausea And Vomiting  . Vibramycin (Doxycycline Calcium) Diarrhea  . Sulfonamide Derivatives Rash    Current Outpatient Prescriptions on File Prior to Visit  Medication Sig Dispense Refill  . ALPRAZolam (XANAX) 1 MG tablet TAKE 1 TABLET 3 TIMES A DAY AS NEEDED FOR GRIEF  90 tablet  3  . aspirin 81 MG tablet Take 81 mg by mouth daily.        Marland Kitchen atorvastatin (LIPITOR) 20 MG tablet TAKE 2 TABLETS BY MOUTH DAILY  60 tablet  11  . clotrimazole-betamethasone (LOTRISONE) cream Apply topically 2 (two) times daily.  45 g  0  . cyclobenzaprine (FLEXERIL) 10 MG tablet Take 10 mg by mouth 2 (two) times daily as needed. For muscle spasms      . digoxin (LANOXIN) 0.25 MG tablet TAKE 1 TABLET EVERY DAY  90 tablet  2  . furosemide (LASIX) 40 MG tablet Take  20 mg by mouth daily.      Marland Kitchen gabapentin (NEURONTIN) 300 MG capsule TAKE 1 CAPSULE THREE TIMES A DAY  90 capsule  2  . hydrochlorothiazide (MICROZIDE) 12.5 MG capsule TAKE ONE CAPSULE BY MOUTH EVERY DAY  90 capsule  0  . HYDROcodone-acetaminophen (NORCO) 10-325 MG per tablet Take 1 tablet by mouth every 4 (four) hours as needed for pain.  150 tablet  3  . hydrocortisone 2.5 % cream       . insulin detemir (LEVEMIR) 100 UNIT/ML injection Inject under skin 70 units twice a day  60 mL  12  . Insulin Pen Needle 31G X 8 MM MISC Use as directed three times a day  300 each  2  . lansoprazole (PREVACID) 30 MG capsule Take 30 mg by mouth daily as needed. For heartburn      . Levomilnacipran HCl ER (FETZIMA) 40 MG CP24 Take 1 capsule by mouth daily.  30 capsule    . metFORMIN (GLUCOPHAGE) 500 MG tablet Take 2 tablets (1,000 mg total) by mouth 2 (two) times daily with a meal.  180 tablet  3  . metoprolol succinate (TOPROL-XL) 100 MG 24 hr tablet Take 1 tablet (100 mg total) by mouth 2 (two) times daily. Take with or immediately following a meal.  60 tablet  6  . nitroGLYCERIN (NITROSTAT) 0.4 MG SL tablet Place 1 tablet (0.4 mg total) under the tongue every 5 (five) minutes as needed. For chest pain  30 tablet  6  . ONE TOUCH ULTRA TEST test strip USE 6 TIMES DAILY AS DIRECTED  200 each  2  . potassium chloride SA (KLOR-CON M20) 20 MEQ tablet 1 tablet twice daily  60 tablet  3  . spironolactone (ALDACTONE) 25 MG tablet TAKE 1 TABLET (25 MG TOTAL) BY MOUTH 2 (TWO) TIMES DAILY.  60 tablet  6  . telmisartan (MICARDIS) 40 MG tablet Take 40 mg by mouth daily.        Marland Kitchen warfarin (COUMADIN) 6 MG tablet Take 3-6 mg by mouth daily. 6mg  on mondays 3 mg all other days       Current Facility-Administered Medications on File Prior to Visit  Medication Dose Route Frequency Provider Last Rate Last Dose  . methylPREDNISolone acetate (DEPO-MEDROL) injection 40 mg  40 mg Intramuscular Once Stacie Glaze, MD        BP 116/66   Pulse 95  Wt 235 lb 12.8 oz (106.958 kg)  BMI 43.12 kg/m2  SpO2 97%chart    Objective:   Physical Exam  Constitutional: She is oriented to person, place, and time. She appears well-developed and well-nourished.  HENT:  Right Ear: External ear normal.  Left Ear: External ear normal.  Nose: Nose normal.  Mouth/Throat: Oropharynx is clear and moist.  Neck:  Pain is Worse with flexion and extension. And especially severe with left rotation  Cardiovascular: Normal rate, regular rhythm and normal heart sounds.   Pulmonary/Chest: Effort normal and breath sounds normal.  Abdominal: Soft. Bowel sounds are normal.  Musculoskeletal: Normal range of motion.  Neurological: She is alert and oriented to person, place, and time. She has normal reflexes. She displays normal reflexes. Coordination normal.  Skin: Skin is warm and dry.  Psychiatric: She has a normal mood and affect.          Assessment & Plan:  Assessment: 1. Neck pain 2. Osteoarthritis of the neck  Plan: Since her blood sugars are running high and last A1c was 8.1, I am avoiding steroids to prevent further hyperglycemia. We will try Celebrex 200 mg twice daily temporarily until we get the inflammation under control. Celebrex is less likely to interfere with her INR reading. We will keep her current appointment on 08/06/2012 to be sure that she has remained stable. Patient advised to use my chart a call the office with any questions or concerns. Recheck a schedule, and as needed.

## 2012-07-26 ENCOUNTER — Telehealth: Payer: Self-pay | Admitting: Internal Medicine

## 2012-07-26 DIAGNOSIS — M1990 Unspecified osteoarthritis, unspecified site: Secondary | ICD-10-CM

## 2012-07-26 NOTE — Telephone Encounter (Signed)
PT called stated that [facility] denied ever receiving celecoxib (CELEBREX) 200 MG capsule. Please re-send RX.

## 2012-07-31 ENCOUNTER — Other Ambulatory Visit: Payer: Self-pay | Admitting: Internal Medicine

## 2012-07-31 ENCOUNTER — Telehealth: Payer: Self-pay | Admitting: Cardiovascular Disease

## 2012-07-31 MED ORDER — CELECOXIB 200 MG PO CAPS: 200.0000 mg | ORAL_CAPSULE | Freq: Two times a day (BID) | ORAL | Status: AC

## 2012-07-31 NOTE — Telephone Encounter (Signed)
Called in.

## 2012-07-31 NOTE — Telephone Encounter (Signed)
Left message to notify pt Rx resent to pharmacy

## 2012-07-31 NOTE — Telephone Encounter (Signed)
New problem    C/O not feeling well.  After doing house work start noticing she was sweating  . Pulse around  70 .   Describe her symptoms like something is different.

## 2012-08-01 NOTE — Telephone Encounter (Signed)
Called patient again to check on her since she did not call us back yesterday and LM

## 2012-08-02 ENCOUNTER — Telehealth: Payer: Self-pay | Admitting: *Deleted

## 2012-08-02 NOTE — Telephone Encounter (Signed)
Pt called stating that she is trying to get assistance to help with getting her medication. Asked if we had any samples of levemir she could get for now. She cannot afford her rx at the pharmacy. I gave the pt 4 boxes of levemir: 1-lot #[id number] and 3-lot #[id number].

## 2012-08-03 ENCOUNTER — Telehealth: Payer: Self-pay | Admitting: *Deleted

## 2012-08-03 NOTE — Telephone Encounter (Signed)
Thank you. chris 

## 2012-08-03 NOTE — Telephone Encounter (Signed)
Spoke with pt. She reports she is feeling better. Had episode the other day where she didn't feel well. HR was around 70. No chest pain. States she had episode where left side of chest felt strange. No pain. Is being treated by primary MD for neck pain. Describes as pain behind her left ear. Reports being under a lot of stress recently.  No cardiac complaints at this time. She will call us if symptoms reoccur.  Best number to reach pt is her cell phone number.

## 2012-08-03 NOTE — Telephone Encounter (Signed)
Completed Patient Assistance Program Application (Dr Elvera Lennox signed) and faxed to [employer] at [phone]. Called pt and let her know that I faxed them.

## 2012-08-06 ENCOUNTER — Ambulatory Visit (INDEPENDENT_AMBULATORY_CARE_PROVIDER_SITE_OTHER): Payer: Medicare Other | Admitting: Internal Medicine

## 2012-08-06 ENCOUNTER — Ambulatory Visit (INDEPENDENT_AMBULATORY_CARE_PROVIDER_SITE_OTHER): Payer: Medicare Other | Admitting: Family

## 2012-08-06 ENCOUNTER — Encounter: Payer: Self-pay | Admitting: Internal Medicine

## 2012-08-06 VITALS — BP 130/80 | HR 80 | Temp 98.2°F | Resp 18 | Ht 62.0 in | Wt 232.0 lb

## 2012-08-06 DIAGNOSIS — IMO0001 Reserved for inherently not codable concepts without codable children: Secondary | ICD-10-CM

## 2012-08-06 DIAGNOSIS — M5412 Radiculopathy, cervical region: Secondary | ICD-10-CM

## 2012-08-06 DIAGNOSIS — M797 Fibromyalgia: Secondary | ICD-10-CM

## 2012-08-06 DIAGNOSIS — M25519 Pain in unspecified shoulder: Secondary | ICD-10-CM

## 2012-08-06 DIAGNOSIS — I4891 Unspecified atrial fibrillation: Secondary | ICD-10-CM

## 2012-08-06 DIAGNOSIS — Z7901 Long term (current) use of anticoagulants: Secondary | ICD-10-CM

## 2012-08-06 DIAGNOSIS — E1165 Type 2 diabetes mellitus with hyperglycemia: Secondary | ICD-10-CM

## 2012-08-06 DIAGNOSIS — M25512 Pain in left shoulder: Secondary | ICD-10-CM

## 2012-08-06 NOTE — Patient Instructions (Addendum)
Could take 1000 mg of metformin in the AM and 500 at HS due to bloating

## 2012-08-06 NOTE — Progress Notes (Signed)
Subjective:    Patient ID: Kristen [name], female    DOB: 1945-05-09, 67 y.o.   MRN: [id number]  HPI Cervical radiculopathy radiating into the left shoulder and up the left neck towards the occiput.  This suggests multilevel disc disease with probably spondylolisthesis Plain films from 2013 suggested widespread degenerative disc disease with endplate bone spurring at multiple levels   Discussed the effect and side effect of metformin Has been placed on metformin  Review of Systems  HENT: Positive for neck pain and neck stiffness.   Eyes: Negative.   Respiratory: Negative.   Cardiovascular: Positive for palpitations and leg swelling.  Gastrointestinal: Positive for abdominal distention. Negative for abdominal pain.  Endocrine: Positive for heat intolerance. Negative for cold intolerance.  Genitourinary: Positive for frequency. Negative for decreased urine volume, vaginal bleeding, vaginal discharge and vaginal pain.   Past Medical History  Diagnosis Date  . CAD (coronary artery disease)   . PAF (paroxysmal atrial fibrillation)   . Hypertension   . Hyperlipidemia   . GERD (gastroesophageal reflux disease)   . Hypokalemia   . Diabetes mellitus   . Depression   . Lumbar back pain   . LBBB (left bundle branch block)   . Headache(784.0)   . Lymphadenitis   . Arthritis   . Bursitis   . Chronic anticoagulation   . Heart palpitations   . Diastolic dysfunction     per echo in October 2012 with EF 50 to 55%  . 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 GERD (gastroesophageal reflux disease) 10/23/2003  . Neuromuscular disorder     fibromyalgia  . Blood transfusion without reported diagnosis     History   Social History  . Marital Status: Legally Separated    Spouse Name: N/A    Number of Children: N/A  . Years of Education: N/A   Occupational History  . Not on file.   Social History Main Topics  . Smoking status: Former Smoker    Quit date: 03/07/2001  .  Smokeless tobacco: Never Used  . Alcohol Use: No  . Drug Use: No  . Sexually Active: Not on file   Other Topics Concern  . Not on file   Social History Narrative  . No narrative on file    Past Surgical History  Procedure Laterality Date  . Angioplasty  laminectomy  . Lumbar laminectomy      x3  . Coronary artery bypass graft      LIMA to LAD   . Abdominal hysterectomy    . Cholecystectomy    . Tonsillectomy    . Knee arthroscopy    . Sphincterotomy      Family History  Problem Relation Age of Onset  . Heart attack Father   . Heart disease Father   . Kidney disease    . Stroke    . Arthritis    . Hypertension    . Diabetes    . Stroke Mother   . Colon cancer Paternal Uncle     Allergies  Allergen Reactions  . Cleocin (Clindamycin Hcl) Diarrhea  . Codeine Itching  . Doxycycline Hyclate Diarrhea  . Morphine Hives  . Pentazocine Lactate Itching and Nausea And Vomiting  . Vibramycin (Doxycycline Calcium) Diarrhea  . Sulfonamide Derivatives Rash    Current Outpatient Prescriptions on File Prior to Visit  Medication Sig Dispense Refill  . ALPRAZolam (XANAX) 1 MG tablet TAKE 1 TABLET 3 TIMES A DAY AS NEEDED FOR GRIEF  90 tablet  3  . aspirin 81 MG tablet Take 81 mg by mouth daily.        Marland Kitchen atorvastatin (LIPITOR) 20 MG tablet TAKE 2 TABLETS BY MOUTH DAILY  60 tablet  11  . celecoxib (CELEBREX) 200 MG capsule Take 1 capsule (200 mg total) by mouth 2 (two) times daily.  60 capsule  3  . clotrimazole-betamethasone (LOTRISONE) cream Apply topically 2 (two) times daily.  45 g  0  . cyclobenzaprine (FLEXERIL) 10 MG tablet Take 10 mg by mouth 2 (two) times daily as needed. For muscle spasms      . digoxin (LANOXIN) 0.25 MG tablet TAKE 1 TABLET EVERY DAY  90 tablet  2  . furosemide (LASIX) 40 MG tablet Take 20 mg by mouth daily.      Marland Kitchen gabapentin (NEURONTIN) 300 MG capsule TAKE 1 CAPSULE THREE TIMES A DAY  90 capsule  2  . hydrochlorothiazide (MICROZIDE) 12.5 MG capsule  TAKE ONE CAPSULE BY MOUTH EVERY DAY  90 capsule  0  . HYDROcodone-acetaminophen (NORCO) 10-325 MG per tablet Take 1 tablet by mouth every 4 (four) hours as needed for pain.  150 tablet  3  . hydrocortisone 2.5 % cream       . insulin detemir (LEVEMIR) 100 UNIT/ML injection Inject under skin 70 units twice a day  60 mL  12  . Insulin Pen Needle 31G X 8 MM MISC Use as directed three times a day  300 each  2  . lansoprazole (PREVACID) 30 MG capsule Take 30 mg by mouth daily as needed. For heartburn      . Levomilnacipran HCl ER (FETZIMA) 40 MG CP24 Take 1 capsule by mouth daily.  30 capsule    . metFORMIN (GLUCOPHAGE) 500 MG tablet Take 2 tablets (1,000 mg total) by mouth 2 (two) times daily with a meal.  180 tablet  3  . metoprolol succinate (TOPROL-XL) 100 MG 24 hr tablet Take 1 tablet (100 mg total) by mouth 2 (two) times daily. Take with or immediately following a meal.  60 tablet  6  . nitroGLYCERIN (NITROSTAT) 0.4 MG SL tablet Place 1 tablet (0.4 mg total) under the tongue every 5 (five) minutes as needed. For chest pain  30 tablet  6  . ONE TOUCH ULTRA TEST test strip USE 6 TIMES DAILY AS DIRECTED  200 each  2  . potassium chloride SA (KLOR-CON M20) 20 MEQ tablet 1 tablet twice daily  60 tablet  3  . spironolactone (ALDACTONE) 25 MG tablet TAKE 1 TABLET (25 MG TOTAL) BY MOUTH 2 (TWO) TIMES DAILY.  60 tablet  6  . telmisartan (MICARDIS) 40 MG tablet Take 40 mg by mouth daily.        Marland Kitchen warfarin (COUMADIN) 6 MG tablet Take 3-6 mg by mouth daily. 6mg  on mondays 3 mg all other days       Current Facility-Administered Medications on File Prior to Visit  Medication Dose Route Frequency Provider Last Rate Last Dose  . methylPREDNISolone acetate (DEPO-MEDROL) injection 40 mg  40 mg Intramuscular Once Stacie Glaze, MD        BP 130/80  Pulse 80  Temp(Src) 98.2 F (36.8 C)  Resp 18  Ht 5\' 2"  (1.575 m)  Wt 232 lb (105.235 kg)  BMI 42.42 kg/m2       Objective:   Physical Exam  Nursing  note and vitals reviewed. Constitutional: She is oriented to person, place, and time. She appears  well-nourished.  HENT:  Head: Normocephalic and atraumatic.  Eyes: Conjunctivae are normal.  Neck: Normal range of motion. Neck supple.  Cardiovascular: Normal rate.   Murmur heard. Pulmonary/Chest: Effort normal and breath sounds normal.  Abdominal: Soft. Bowel sounds are normal.  Musculoskeletal: She exhibits tenderness. She exhibits no edema.  Neurological: She is alert and oriented to person, place, and time.  Skin: Skin is warm and dry.  Psychiatric: She has a normal mood and affect. Her behavior is normal.          Assessment & Plan:  Severe multilevel osteoarthritis of the neck with spurring and chronic left radicular pain. I think paraspinal injection for chronic pain would be the best and most effective courses of treatment Moderate adjustment of the metformin to 1500 mg and will look for the bmet after she she's endocrine

## 2012-08-06 NOTE — Patient Instructions (Addendum)
Take an extra 1/2 tab of Coumadin today and tomorrow. Continue 6mg  on Monday and 3mg  all other days. Recheck in 3 weeks  Anticoagulation Dose Instructions as of 08/06/2012     Glynis Smiles Tue Wed Thu Fri Sat   New Dose 3 mg 6 mg 3 mg 3 mg 3 mg 3 mg 3 mg    Description       Take an extra 1/2 tab of Coumadin today and tomorrow. Continue 6mg  on Monday and 3mg  all other days. Recheck in 3 weeks

## 2012-08-07 ENCOUNTER — Telehealth: Payer: Self-pay | Admitting: *Deleted

## 2012-08-07 NOTE — Telephone Encounter (Signed)
Called pt and told her to reduce the metformin dose to half of her dosage for a few days; then try to build it up again. If you get the same symptoms again, go back and stay on the lower dose. If you have any questions call us back.

## 2012-08-07 NOTE — Telephone Encounter (Signed)
Pt called stating she is on full dosage of metformin and has been fighting nausea for a few days. Pt vomited one time last night. She states she is feeling really queasy today. Pt wants to know if she can get something to help with this for a few days to see if this goes away. Please advise.

## 2012-08-07 NOTE — Telephone Encounter (Signed)
Kristen [name], please tell her to reduce the Metformin dose to half for few days, then to try to build up again, if she gets this again, to stay on the lower dose.

## 2012-08-09 ENCOUNTER — Encounter: Payer: Self-pay | Admitting: Internal Medicine

## 2012-08-14 ENCOUNTER — Telehealth: Payer: Self-pay | Admitting: *Deleted

## 2012-08-14 ENCOUNTER — Telehealth: Payer: Self-pay | Admitting: Internal Medicine

## 2012-08-14 NOTE — Telephone Encounter (Signed)
She can resume Levemir tonight, Metformin tomorrow am.

## 2012-08-14 NOTE — Telephone Encounter (Signed)
Patient Information:  Caller Name: [name]  Phone: [phone]  Patient: Kristen [name]  Gender: Female  DOB: [birth year]  Age: 67 Years  PCP: Darryll Capers (Adults only)  Office Follow Up:  Does the office need to follow up with this patient?: No  Instructions For The Office: N/A  RN Note:  Can touch chin to chest and roll head back.  Pain is worst when turns head to left. Neck pain rated 3-4/10 with last dose of Hydrocodone approximately 1330. Random blood sugar 164 at 1400. Has 50 cent sized lymph node behind ear.  Symptoms  Reason For Call & Symptoms: Posterior neck pain for weeks.  Notes increasing pain and painful swollen lymph nodes especially on left side of neck to chin and enlarged node behind left ear. Tingling present behind neck.   Per My Chart, patient noted diagnosis of lymphadenitis. Taking pain medicine which helps for 2 hours.  Asking if has infection and needs antibiotic?  Also reported accidently took 2 doses of insulin last night and spent night talking with Poison Control and eating to keep blood sugar up.  Waiting to hear back from endocrinologist.  Asking if should take Celebrex due to upset stomach/GERD.  Reviewed Health History In EMR: Yes  Reviewed Medications In EMR: Yes  Reviewed Allergies In EMR: Yes  Reviewed Surgeries / Procedures: Yes  Date of Onset of Symptoms: 07/10/2012  Treatments Tried: Hydrocodone, Celebrex  Treatments Tried Worked: Yes  Guideline(s) Used:  Neck Pain or Stiffness  Lymph Nodes - Swollen  Disposition Per Guideline:   Go to Office Now  Reason For Disposition Reached:   Single large node and size > 1 inch (2.5 cm)  Advice Given:  Swollen Lymph Nodes from a Viral Infection:  Viral throat infections and colds can cause lymph nodes in the neck to double in size. Slight enlargement and mild tenderness means the lymph node is fighting the infection and doing a good job.  Treatment: Usually no treatment is necessary.  Call  Back If:  Overlying skin becomes red  Fever more than 103 F (39.4 C) occurs  You become worse or are worried about a lymph node.  Patient Refused Recommendation:  Patient Refused Appt, Patient Requests Appt At Later Date  Declined appointment for 08/14/12 due to distance and transportation.  Scheduled extended visit for 08/15/12 at 1115 with Lubertha Sayres, PA per caller request.

## 2012-08-14 NOTE — Telephone Encounter (Signed)
Called pt and let her know that she can resume Levemir tonight and resume metformin in the morning. Pt understood.

## 2012-08-14 NOTE — Telephone Encounter (Signed)
Pt called and lvm stating that pt believes she took double dosage of her insulin last night. Pt states she called poison control and was up all night taking her blood glucose readings every 1 and 1/2 hrs as well as eating peanut butter sandwiches. Pt has not taken her insulin or metformin today. Pt states she has felt sick all day and has not had much sleep. Her cbg earlier was 134 and at 2:00 it was 164. Pt wants to know if and when she should resume taking her insulin and metformin. Please advise.

## 2012-08-15 ENCOUNTER — Ambulatory Visit (INDEPENDENT_AMBULATORY_CARE_PROVIDER_SITE_OTHER): Payer: Medicare Other | Admitting: Family

## 2012-08-15 ENCOUNTER — Encounter: Payer: Self-pay | Admitting: Family

## 2012-08-15 VITALS — BP 118/64 | HR 81 | Temp 97.8°F | Wt 233.0 lb

## 2012-08-15 DIAGNOSIS — M542 Cervicalgia: Secondary | ICD-10-CM

## 2012-08-15 DIAGNOSIS — I889 Nonspecific lymphadenitis, unspecified: Secondary | ICD-10-CM

## 2012-08-15 LAB — CBC WITH DIFFERENTIAL/PLATELET
Basophils Absolute: 0 10*3/uL (ref 0.0–0.1)
Eosinophils Relative: 3.1 % (ref 0.0–5.0)
HCT: 41 % (ref 36.0–46.0)
Lymphs Abs: 3.5 10*3/uL (ref 0.7–4.0)
MCV: 94.7 fl (ref 78.0–100.0)
Monocytes Absolute: 0.5 10*3/uL (ref 0.1–1.0)
Platelets: 295 10*3/uL (ref 150.0–400.0)
RDW: 13.8 % (ref 11.5–14.6)

## 2012-08-15 LAB — BASIC METABOLIC PANEL
BUN: 14 mg/dL (ref 6–23)
Chloride: 107 mEq/L (ref 96–112)
Glucose, Bld: 92 mg/dL (ref 70–99)
Potassium: 3.8 mEq/L (ref 3.5–5.1)

## 2012-08-15 NOTE — Progress Notes (Signed)
Subjective:    Patient ID: Kristen [name], female    DOB: Jan 13, 1946, 67 y.o.   MRN: [id number]  HPI Pt is a 67 yo [place] American female who presents to PCP with reports of swelling and pain to L neck and behind L ear x 5-6 weeks. Pt rates pain 6/10 and states it is worsened with touch. Pt reports fatigue accompanying the onset of swelling. Has concerns of Lymphoma due to a history of lymphdenitis. Denies any throat pain, difficulty swallowing, fevers or chills. Reports effective pain management with hydrocodone.  Review of Systems  Constitutional: Negative.   HENT:       Swelling to L neck and behind L ear  Eyes: Negative.   Respiratory: Negative.   Cardiovascular: Negative.   Gastrointestinal: Negative.   Endocrine: Negative.   Genitourinary: Negative.   Musculoskeletal: Negative.   Skin: Negative.   Allergic/Immunologic: Negative.   Neurological: Negative.   Hematological: Negative.   Psychiatric/Behavioral: Negative.    Past Medical History  Diagnosis Date  . CAD (coronary artery disease)   . PAF (paroxysmal atrial fibrillation)   . Hypertension   . Hyperlipidemia   . GERD (gastroesophageal reflux disease)   . Hypokalemia   . Diabetes mellitus   . Depression   . Lumbar back pain   . LBBB (left bundle branch block)   . Headache(784.0)   . Lymphadenitis   . Arthritis   . Bursitis   . Chronic anticoagulation   . Heart palpitations   . Diastolic dysfunction     per echo in October 2012 with EF 50 to 55%  . 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 GERD (gastroesophageal reflux disease) 10/23/2003  . Neuromuscular disorder     fibromyalgia  . Blood transfusion without reported diagnosis     History   Social History  . Marital Status: Legally Separated    Spouse Name: N/A    Number of Children: N/A  . Years of Education: N/A   Occupational History  . Not on file.   Social History Main Topics  . Smoking status: Former Smoker    Quit date:  03/07/2001  . Smokeless tobacco: Never Used  . Alcohol Use: No  . Drug Use: No  . Sexually Active: Not on file   Other Topics Concern  . Not on file   Social History Narrative  . No narrative on file    Past Surgical History  Procedure Laterality Date  . Angioplasty  laminectomy  . Lumbar laminectomy      x3  . Coronary artery bypass graft      LIMA to LAD   . Abdominal hysterectomy    . Cholecystectomy    . Tonsillectomy    . Knee arthroscopy    . Sphincterotomy      Family History  Problem Relation Age of Onset  . Heart attack Father   . Heart disease Father   . Kidney disease    . Stroke    . Arthritis    . Hypertension    . Diabetes    . Stroke Mother   . Colon cancer Paternal Uncle     Allergies  Allergen Reactions  . Cleocin (Clindamycin Hcl) Diarrhea  . Codeine Itching  . Doxycycline Hyclate Diarrhea  . Morphine Hives  . Pentazocine Lactate Itching and Nausea And Vomiting  . Vibramycin (Doxycycline Calcium) Diarrhea  . Sulfonamide Derivatives Rash    Current Outpatient Prescriptions on File Prior to Visit  Medication Sig Dispense Refill  . ALPRAZolam (XANAX) 1 MG tablet TAKE 1 TABLET 3 TIMES A DAY AS NEEDED FOR GRIEF  90 tablet  3  . aspirin 81 MG tablet Take 81 mg by mouth daily.        Marland Kitchen atorvastatin (LIPITOR) 20 MG tablet TAKE 2 TABLETS BY MOUTH DAILY  60 tablet  11  . celecoxib (CELEBREX) 200 MG capsule Take 1 capsule (200 mg total) by mouth 2 (two) times daily.  60 capsule  3  . clotrimazole-betamethasone (LOTRISONE) cream Apply topically 2 (two) times daily.  45 g  0  . cyclobenzaprine (FLEXERIL) 10 MG tablet Take 10 mg by mouth 2 (two) times daily as needed. For muscle spasms      . digoxin (LANOXIN) 0.25 MG tablet TAKE 1 TABLET EVERY DAY  90 tablet  2  . furosemide (LASIX) 40 MG tablet Take 20 mg by mouth daily.      Marland Kitchen gabapentin (NEURONTIN) 300 MG capsule TAKE 1 CAPSULE THREE TIMES A DAY  90 capsule  2  . hydrochlorothiazide (MICROZIDE)  12.5 MG capsule TAKE ONE CAPSULE BY MOUTH EVERY DAY  90 capsule  0  . HYDROcodone-acetaminophen (NORCO) 10-325 MG per tablet Take 1 tablet by mouth every 4 (four) hours as needed for pain.  150 tablet  3  . hydrocortisone 2.5 % cream       . insulin detemir (LEVEMIR) 100 UNIT/ML injection Inject under skin 70 units twice a day  60 mL  12  . Insulin Pen Needle 31G X 8 MM MISC Use as directed three times a day  300 each  2  . lansoprazole (PREVACID) 30 MG capsule Take 30 mg by mouth daily as needed. For heartburn      . Levomilnacipran HCl ER (FETZIMA) 40 MG CP24 Take 1 capsule by mouth daily.  30 capsule    . metFORMIN (GLUCOPHAGE) 500 MG tablet Take 2 tablets (1,000 mg total) by mouth 2 (two) times daily with a meal.  180 tablet  3  . metoprolol succinate (TOPROL-XL) 100 MG 24 hr tablet Take 1 tablet (100 mg total) by mouth 2 (two) times daily. Take with or immediately following a meal.  60 tablet  6  . nitroGLYCERIN (NITROSTAT) 0.4 MG SL tablet Place 1 tablet (0.4 mg total) under the tongue every 5 (five) minutes as needed. For chest pain  30 tablet  6  . ONE TOUCH ULTRA TEST test strip USE 6 TIMES DAILY AS DIRECTED  200 each  2  . potassium chloride SA (KLOR-CON M20) 20 MEQ tablet 1 tablet twice daily  60 tablet  3  . spironolactone (ALDACTONE) 25 MG tablet TAKE 1 TABLET (25 MG TOTAL) BY MOUTH 2 (TWO) TIMES DAILY.  60 tablet  6  . telmisartan (MICARDIS) 40 MG tablet Take 40 mg by mouth daily.        Marland Kitchen warfarin (COUMADIN) 6 MG tablet Take 3-6 mg by mouth daily. 6mg  on mondays 3 mg all other days       Current Facility-Administered Medications on File Prior to Visit  Medication Dose Route Frequency Provider Last Rate Last Dose  . methylPREDNISolone acetate (DEPO-MEDROL) injection 40 mg  40 mg Intramuscular Once Stacie Glaze, MD        BP 118/64  Pulse 81  Temp(Src) 97.8 F (36.6 C) (Oral)  Wt 233 lb (105.688 kg)  BMI 42.61 kg/m2  SpO2 96%chart    Objective:   Physical Exam   Constitutional:  She is oriented to person, place, and time. She appears well-developed and well-nourished.  HENT:  Head: Normocephalic and atraumatic.  Edema and tenderness with palpation to L side occiput, post auricular and cervical areas  Eyes: Pupils are equal, round, and reactive to light.  Neck: Normal range of motion.  Cardiovascular: Normal rate and regular rhythm.   Pulmonary/Chest: Effort normal and breath sounds normal.  Abdominal: Soft. Bowel sounds are normal.  Musculoskeletal: Normal range of motion.  Neurological: She is alert and oriented to person, place, and time.  Skin: Skin is warm and dry.          Assessment & Plan:  1. Lymphadentitis 2. Left neck pain  Plan: Patient is concerned about lymphoma. CBC w/Diff and BMP ordered; pt will be scheduled for CT scan of neck. PCP to follow up with results of CT to pt. Pt instructed to return to PCP if symptoms worsen.

## 2012-08-16 ENCOUNTER — Encounter: Payer: Self-pay | Admitting: Family

## 2012-08-20 ENCOUNTER — Telehealth: Payer: Self-pay | Admitting: Internal Medicine

## 2012-08-20 MED ORDER — TELMISARTAN 40 MG PO TABS: 40.0000 mg | ORAL_TABLET | Freq: Every day | ORAL | Status: DC

## 2012-08-20 NOTE — Telephone Encounter (Signed)
Pt needs a rx for telmisartan (MICARDIS) 40 MG tablet.  Pt states md has been giving her samples, but she is now out! Pharm: [facility]

## 2012-08-20 NOTE — Telephone Encounter (Signed)
talked with pharmacy.  They now have the.25 ready for pt-,but [name] wants dig level first-pt coming wedneday

## 2012-08-20 NOTE — Telephone Encounter (Signed)
done

## 2012-08-20 NOTE — Telephone Encounter (Signed)
Pharmacist called to state that they filled the pt's digoxin (LANOXIN) 0.25 MG tablet incorrectly. The patient was receiving .125, instead of .25. Please assist.

## 2012-08-21 ENCOUNTER — Telehealth: Payer: Self-pay | Admitting: Internal Medicine

## 2012-08-21 ENCOUNTER — Other Ambulatory Visit: Payer: Medicare Other

## 2012-08-21 DIAGNOSIS — I4891 Unspecified atrial fibrillation: Secondary | ICD-10-CM

## 2012-08-21 NOTE — Telephone Encounter (Signed)
Order in computer and pt is going now

## 2012-08-21 NOTE — Telephone Encounter (Signed)
Pt would like to go to [facility] at [address] for the  digoxin level--(the labs scheduled here for tomorrow). Pt has someone that can take her right away and would like to go RIGHT AWAY!. I advised pt I would need to contact nurse for the approval. Pt can be reached at home. Pls advise.

## 2012-08-22 ENCOUNTER — Other Ambulatory Visit: Payer: Medicare Other

## 2012-08-22 LAB — DIGOXIN LEVEL: Digoxin Level: 0.4 ng/mL — ABNORMAL LOW (ref 0.8–2.0)

## 2012-08-22 NOTE — Telephone Encounter (Signed)
PT INFORMED DIG LEVEL IS LOW AND WILL NEED TO BE ON .25MG - PHARMACY CALLED AND PT WAS CALLED

## 2012-08-23 ENCOUNTER — Encounter (INDEPENDENT_AMBULATORY_CARE_PROVIDER_SITE_OTHER): Payer: Self-pay

## 2012-08-23 ENCOUNTER — Encounter: Payer: Self-pay | Admitting: Internal Medicine

## 2012-08-23 ENCOUNTER — Encounter: Payer: Self-pay | Admitting: Family

## 2012-08-26 ENCOUNTER — Other Ambulatory Visit: Payer: Self-pay | Admitting: Internal Medicine

## 2012-08-27 ENCOUNTER — Encounter: Payer: Medicare Other | Admitting: Family

## 2012-08-27 ENCOUNTER — Other Ambulatory Visit: Payer: Self-pay | Admitting: *Deleted

## 2012-08-27 MED ORDER — FUROSEMIDE 20 MG PO TABS: ORAL_TABLET | ORAL | Status: DC

## 2012-08-29 ENCOUNTER — Encounter: Payer: Self-pay | Admitting: Internal Medicine

## 2012-08-29 ENCOUNTER — Other Ambulatory Visit: Payer: Self-pay | Admitting: *Deleted

## 2012-08-29 ENCOUNTER — Encounter: Payer: Medicare Other | Admitting: Family

## 2012-08-29 MED ORDER — FLUCONAZOLE 100 MG PO TABS: 100.0000 mg | ORAL_TABLET | Freq: Every day | ORAL | Status: DC

## 2012-08-29 NOTE — Telephone Encounter (Signed)
Sent message to pt that we would send in diflucan

## 2012-08-30 ENCOUNTER — Telehealth: Payer: Self-pay | Admitting: Internal Medicine

## 2012-08-30 ENCOUNTER — Encounter: Payer: Self-pay | Admitting: Internal Medicine

## 2012-08-30 ENCOUNTER — Ambulatory Visit (INDEPENDENT_AMBULATORY_CARE_PROVIDER_SITE_OTHER): Payer: Medicare Other | Admitting: Internal Medicine

## 2012-08-30 ENCOUNTER — Other Ambulatory Visit: Payer: Self-pay

## 2012-08-30 VITALS — BP 120/60 | HR 99 | Temp 97.8°F | Resp 12 | Ht 62.0 in | Wt 227.0 lb

## 2012-08-30 DIAGNOSIS — Z1231 Encounter for screening mammogram for malignant neoplasm of breast: Secondary | ICD-10-CM

## 2012-08-30 DIAGNOSIS — E118 Type 2 diabetes mellitus with unspecified complications: Secondary | ICD-10-CM

## 2012-08-30 LAB — HEMOGLOBIN A1C: Hgb A1c MFr Bld: 7.6 % — ABNORMAL HIGH (ref 4.6–6.5)

## 2012-08-30 MED ORDER — INSULIN DETEMIR 100 UNIT/ML ~~LOC~~ SOLN: SUBCUTANEOUS | Status: DC

## 2012-08-30 MED ORDER — METFORMIN HCL ER 500 MG PO TB24: 1000.0000 mg | ORAL_TABLET | Freq: Two times a day (BID) | ORAL | Status: DC

## 2012-08-30 NOTE — Patient Instructions (Addendum)
Please decrease Levemir to 60 units twice a day. Stop regular metformin and start Metformin ER 1000 mg 2x a day. Please return in 3 months.

## 2012-08-30 NOTE — Telephone Encounter (Signed)
Pt called [facility] for a mammogram d/t discoloration on L breast. Because it will be a diagnostic mammogram, not routine, she needs an order. Please place order to have dx mamm[facility] done [facility]. She would like the mammogram mid-July after 7/11.

## 2012-08-30 NOTE — Progress Notes (Signed)
Patient ID: Kristen [name], female   DOB: February 29, 1946, 67 y.o.   MRN: [id number]  HPI: Kristen [name] is a 67 y.o.-year-old female, returning for DM2, dx 2008, insulin-dependent, uncontrolled, with complications (CAD, PAD, h/o CVD, peripheral neuropathy).   Last hemoglobin A1c was: Lab Results  Component Value Date   HGBA1C 8.1* 05/07/2012    Pt is on a regimen of: - Levemir pen 70 units bid - she is not affording Levemir and gets samples from PCP, but this will be solved soon as she gets the med directly from [facility] - Metformin 500 mg bid - had nausea with more - but she found out from her pharmacist that she can get Metformin ER for not much more than regular Metformin and would like to try this She was on Bydureon in the past, then also on Novolog 60-70-75.   Pt checks her sugars 2-3 a day and they are: - am: 90-115 >> 89-131 now - before lunch: 120-140 >> 82-125 now - before dinner: 99-168 now - bedtime: 110-120 >> 91-123, once 211 No more lows; lowest sugar 82; she has hypoglycemia awareness at 100. Highest sugar was 184, but she mentions that if she checked her sugar at other hand, this was 130...  One time she overdosed on insulin - took evening Levemir twice as she forgot she took the first dose so she was up all night on the phone with Poison Control, trying to maintain her sugars.   She changed her diet, stopped eating breads, eating less sweets.  Pt does not have chronic kidney disease, last BUN/creatinine was:  Lab Results  Component Value Date   BUN 14 08/15/2012   CREATININE 0.8 08/15/2012   Last set of lipids: Lab Results  Component Value Date   CHOL 187 02/28/2011   HDL 35.80* 02/28/2011   LDLCALC 118* 02/28/2011   LDLDIRECT 116.0 11/28/2008   TRIG 165.0* 02/28/2011   CHOLHDL 5 02/28/2011   Pt's last eye exam was in last year. Reportingly no DR. Denies numbness and tingling in her legs.  I reviewed her chart and she also has a history of  hypertension, paroxysmal A. Fib-on anticoagulation, depression, osteoarthritis - status post arthroscopic knee surgeries x2, status post lumbar laminectomy x3, hyperlipidemia, morbid obesity, chronic lymphadenitis, GERD, hypothyroidism - last TSH 1.63, headaches, fibromyalgia.  ROS: Constitutional: fatigue, wt loss, increased appetite, weight gain, hot flashes, poor sleep Eyes: + blurry vision, no xerophthalmia ENT: no sore throat, no nodules palpated in throat, no dysphagia/odynophagia, no hoarseness Cardiovascular: no CP/+SOB/no recent palpitations/no leg swelling Respiratory: no cough/+SOB Gastrointestinal: no N except when increased Metformin/V/D?C Musculoskeletal: no muscle aches/+ joint aches Skin: no rashes Neurological: no tremors/numbness/tingling/dizziness, + headaches Psychiatric: no depression/anxiety  I reviewed pt's medications, allergies, PMH, social hx, family hx and no changes required, except as mentioned above. She also d/c'd Metformin.  PE: BP 120/60  Pulse 99  Temp(Src) 97.8 F (36.6 C) (Oral)  Resp 12  Ht 5\' 2"  (1.575 m)  Wt 227 lb (102.967 kg)  BMI 41.51 kg/m2  SpO2 95% Wt Readings from Last 3 Encounters:  08/30/12 227 lb (102.967 kg)  08/15/12 233 lb (105.688 kg)  08/06/12 232 lb (105.235 kg)   Constitutional: overweight, in NAD, full supraclavicular fat pads Eyes: PERRLA, EOMI, no exophthalmos ENT: moist mucous membranes, no thyromegaly, no cervical lymphadenopathy Cardiovascular: RRR, No MRG Respiratory: CTA B Gastrointestinal: abdomen soft, NT, ND, BS+ Musculoskeletal: no deformities, strength intact in all 4 Skin: moist, warm, acanthosis nigricans  Neurological: no  tremor with outstretched hands, DTR normal in all 4  ASSESSMENT: 1. DM2, insulin-dependent, uncontrolled, with complications - CAD- Echo 12/2010: EF 50-55%, status post CABG 2004: LIMA to LAD - cards: Dr. Riley Kill. Also has a LBBB.  - PAD - h/o stroke - peripheral neuropathy - on  neurontin  PLAN:  1. Patient with a six-year history of diabetes, on high-dose of basal insulin, which we are titrating down. She did very well with decreasing her Levemir dose by 40 units and adding Metformin 500 mg bid. Sugars are almost all at goal. - We discussed about the fact that increasing metformin to target dose will help with maintaining her weight and allowing [place] to decrease the dose of her insulin and also to avoid large fluctuations in her blood sugars. Therefore, will switch to Metformin XR and increase to 1000 mg twice a day.  - I would also decrease the dose of her Levemir from 70 units twice a day to 60 units twice a day. I hope we can titrate down even more in the future. - I advised her to start writing down pre and post meal sugars  - check HbA1C today - given samples of Lantus (as we are out of Levemir) - RTC in 3 mo  Office Visit on 08/30/2012  Component Date Value Range Status  . Hemoglobin A1C 08/30/2012 7.6* 4.6 - 6.5 % Final   Glycemic Control Guidelines for People with Diabetes:Non Diabetic:  <6%Goal of Therapy: <7%Additional Action Suggested:  >8%   Improved.

## 2012-08-31 ENCOUNTER — Telehealth: Payer: Self-pay | Admitting: *Deleted

## 2012-08-31 ENCOUNTER — Other Ambulatory Visit: Payer: Self-pay | Admitting: *Deleted

## 2012-08-31 DIAGNOSIS — N649 Disorder of breast, unspecified: Secondary | ICD-10-CM

## 2012-08-31 MED ORDER — FUROSEMIDE 20 MG PO TABS: 20.0000 mg | ORAL_TABLET | Freq: Every day | ORAL | Status: DC

## 2012-08-31 MED ORDER — FUROSEMIDE 40 MG PO TABS: 40.0000 mg | ORAL_TABLET | Freq: Every day | ORAL | Status: DC

## 2012-08-31 NOTE — Telephone Encounter (Signed)
PT called and stated that she is wondering if she should take her hydrochlorothiazide (MICROZIDE) 12.5 MG capsule again, since she is going to be taking half the amount of her lasix. Please assist.

## 2012-08-31 NOTE — Telephone Encounter (Signed)
Talked with pt and told her about 20 mg-

## 2012-08-31 NOTE — Telephone Encounter (Signed)
I sent 40 mg to pharmacy in error- I called them back and told them to disregard refill, I sent it to them in error

## 2012-08-31 NOTE — Telephone Encounter (Signed)
40mg  dose was temporary. Should be on 20mg  daily.

## 2012-08-31 NOTE — Telephone Encounter (Signed)
r 

## 2012-08-31 NOTE — Telephone Encounter (Signed)
Pt appears confused about lasix-what she says does not go along with med list-- she states you changed her lasix to 40 qd and she said dr Riley Kill stopped the hctz and told her she shouldn't be on both,but hctz still in chart,but she states she has not  been taking. Question is??does she take 20 or 40 mg of lasix?

## 2012-09-02 ENCOUNTER — Other Ambulatory Visit: Payer: Self-pay | Admitting: Internal Medicine

## 2012-09-03 ENCOUNTER — Encounter: Payer: Self-pay | Admitting: Internal Medicine

## 2012-09-04 ENCOUNTER — Other Ambulatory Visit: Payer: Self-pay | Admitting: Internal Medicine

## 2012-09-04 ENCOUNTER — Ambulatory Visit (INDEPENDENT_AMBULATORY_CARE_PROVIDER_SITE_OTHER)
Admission: RE | Admit: 2012-09-04 | Discharge: 2012-09-04 | Disposition: A | Discharge status: Home / Self Care | Payer: Medicare Other | Source: Ambulatory Visit | Attending: Family | Admitting: Family

## 2012-09-04 DIAGNOSIS — I889 Nonspecific lymphadenitis, unspecified: Secondary | ICD-10-CM

## 2012-09-04 MED ORDER — IOHEXOL 300 MG/ML  SOLN
75.0000 mL | Freq: Once | INTRAMUSCULAR | Status: AC | PRN
  Administered 2012-09-04: 75 mL via INTRAVENOUS

## 2012-09-05 ENCOUNTER — Ambulatory Visit (INDEPENDENT_AMBULATORY_CARE_PROVIDER_SITE_OTHER): Payer: Medicare Other | Admitting: Family

## 2012-09-05 DIAGNOSIS — I4891 Unspecified atrial fibrillation: Secondary | ICD-10-CM

## 2012-09-05 DIAGNOSIS — Z7901 Long term (current) use of anticoagulants: Secondary | ICD-10-CM

## 2012-09-05 NOTE — Patient Instructions (Addendum)
Take an extra 1/2 tab of Coumadin today and tomorrow. Take 6mg  on Monday and Wednesday and 3mg  all other days. Recheck in 3 weeks Anticoagulation Dose Instructions as of 09/05/2012     Glynis Smiles Tue Wed Thu Fri Sat   New Dose 3 mg 6 mg 3 mg 6 mg 3 mg 3 mg 3 mg    Description       Take an extra 1/2 tab of Coumadin today and tomorrow. Take 6mg  on Monday and Wednesday and 3mg  all other days. Recheck in 3 weeks

## 2012-09-14 ENCOUNTER — Telehealth: Payer: Self-pay | Admitting: Cardiology

## 2012-09-14 NOTE — Telephone Encounter (Signed)
New problem    Pt having epidural on Tuesday and pt has question

## 2012-09-14 NOTE — Telephone Encounter (Signed)
I spoke with the pt and she is scheduled for an epidural injection on 09/18/12. The pt has not had any further episodes of back pain so she wants to cancel her appointment for epidural but they told the pt to keep appointment on 09/18/12 just in case she had pain. If the pt keeps appointment she would need to stop coumadin and be bridged with lovenox by Dr York Ram office. The pt would have to have INR done today.  At this time the pt plans on calling and canceling appointment for epidural and she will continue coumadin. The pt would also like to schedule an appointment to discuss her increased episodes of AFib.  Appointment scheduled on 10/02/12.

## 2012-09-17 ENCOUNTER — Ambulatory Visit
Admission: RE | Admit: 2012-09-17 | Discharge: 2012-09-17 | Disposition: A | Discharge status: Home / Self Care | Payer: Medicare Other | Source: Ambulatory Visit | Attending: Internal Medicine | Admitting: Internal Medicine

## 2012-09-17 DIAGNOSIS — N649 Disorder of breast, unspecified: Secondary | ICD-10-CM

## 2012-09-18 ENCOUNTER — Ambulatory Visit: Payer: Medicare Other | Admitting: Pharmacist Clinician (PhC)/ Clinical Pharmacy Specialist

## 2012-09-20 ENCOUNTER — Ambulatory Visit: Payer: Medicare Other

## 2012-09-20 ENCOUNTER — Ambulatory Visit (INDEPENDENT_AMBULATORY_CARE_PROVIDER_SITE_OTHER): Payer: Medicare Other | Admitting: General Practice

## 2012-09-20 DIAGNOSIS — Z7901 Long term (current) use of anticoagulants: Secondary | ICD-10-CM

## 2012-09-20 DIAGNOSIS — I4891 Unspecified atrial fibrillation: Secondary | ICD-10-CM

## 2012-09-28 ENCOUNTER — Telehealth: Payer: Self-pay | Admitting: Internal Medicine

## 2012-09-28 NOTE — Telephone Encounter (Signed)
Patient Information:  Caller Name: Dimitria  Phone: [phone]  Patient: Kristen [name], Kristen [name]  Gender: Female  DOB: [birth year]  Age: 67 Years  PCP: Darryll Capers (Adults only)  Office Follow Up:  Does the office need to follow up with this patient?: Yes  Instructions For The Office: PLEASE call pt back; she says if an appt is needed, will have to be next wk d/t transportation; notes in epic state that the Lasix 40mg  was only temporary, but Aramis disagrees; says she was taking 40mg  for a long time; needs to know if she should go back to 40mg    Symptoms  Reason For Call & Symptoms: c/o an increase of fluid in her left leg with some discomfort over the past 2-3 days; says it is always the left leg that gathers fluid; dose of Lasix changed from 40mg  to 20mg  about a month ago; wonders iif the dose needs to be changed  Reviewed Health History In EMR: Yes  Reviewed Medications In EMR: Yes  Reviewed Allergies In EMR: Yes  Reviewed Surgeries / Procedures: Yes  Date of Onset of Symptoms: Unknown  Guideline(s) Used:  Leg Swelling and Edema  Disposition Per Guideline:   Go to ED Now (or to Office with PCP Approval)  Reason For Disposition Reached:   Thigh, calf, or ankle swelling in only one leg  Advice Given:  N/A  RN Overrode Recommendation:  Follow Up With Office Later  pt says she is unable to get an appt today because she is far from the office and doesn't have a ride

## 2012-09-28 NOTE — Telephone Encounter (Signed)
Per [name] take- 20 qod and 40 qod.pt informed

## 2012-10-02 ENCOUNTER — Ambulatory Visit (INDEPENDENT_AMBULATORY_CARE_PROVIDER_SITE_OTHER): Payer: Medicare Other | Admitting: Nurse Practitioner

## 2012-10-02 ENCOUNTER — Encounter: Payer: Self-pay | Admitting: Nurse Practitioner

## 2012-10-02 ENCOUNTER — Encounter (INDEPENDENT_AMBULATORY_CARE_PROVIDER_SITE_OTHER): Payer: Medicare Other

## 2012-10-02 VITALS — BP 120/80 | HR 80 | Ht 62.0 in | Wt 225.8 lb

## 2012-10-02 DIAGNOSIS — I4891 Unspecified atrial fibrillation: Secondary | ICD-10-CM

## 2012-10-02 DIAGNOSIS — I480 Paroxysmal atrial fibrillation: Secondary | ICD-10-CM

## 2012-10-02 NOTE — Patient Instructions (Signed)
We are going to place a 24 hour Holter today to watch your heart rhythm  We are going to get an appointment to see EP  For now, stay on your current medicines  See Dr. Clifton James has planned for October  Call the [facility] office at [phone] if you have any questions, problems or concerns.

## 2012-10-02 NOTE — Progress Notes (Signed)
Kristen [name] Date of Birth: [birth year] Medical Record #[id number]  History of Present Illness: [name] comes back today for a work in visit. Former patient of Dr. Rosalyn Charters. Has multiple medical issues as outlined below which include CAD with past CABG in 2004 - last cath in 2010 was ok, PAF, on coumadin, prior stroke, morbid obesity, diabetes, GERD, fibromyalgia, HTN and LBBB. Last echo in October of 2012 showed an EF of 50 to 55% with grade 1 diastolic dysfunction.   Was last seen here in April. Seemed to be doing ok.   Comes in today. Here alone. She has lots of issues. Primary issue is "fluttering". Says her heart rate is so erratic - happens every day. She feels like it is atrial fib. She remains on coumadin. This makes her nervous. She has used extra metoprolol (above her 200 mg total for the day). She is not active. Not using caffeine. No alcohol. Medicine list not correct. Says she was told to stop her HCTZ since she is on 40 mg of Lasix. We had her on 20 mg of Lasix with the HCTZ. She is now on 20 mg of Lasix due to having to have it refilled. NOT on HCTZ. Notes that she perspires more. Some chest discomfort noted if she rubs her sternum. Does not sound like has symptoms with exertion. Some occasional swelling of her legs noted. Some DOE with hills/inclines.   Current Outpatient Prescriptions  Medication Sig Dispense Refill  . ALPRAZolam (XANAX) 1 MG tablet TAKE 1 TABLET 3 TIMES A DAY AS NEEDED  90 tablet  0  . aspirin 81 MG tablet Take 81 mg by mouth daily.        Marland Kitchen atorvastatin (LIPITOR) 20 MG tablet TAKE 2 TABLETS BY MOUTH DAILY  60 tablet  11  . clotrimazole-betamethasone (LOTRISONE) cream Apply topically 2 (two) times daily.  45 g  0  . cyclobenzaprine (FLEXERIL) 10 MG tablet Take 10 mg by mouth 2 (two) times daily as needed. For muscle spasms      . digoxin (LANOXIN) 0.25 MG tablet TAKE 1 TABLET EVERY DAY  90 tablet  2  . furosemide (LASIX) 20 MG tablet Take 1  tablet (20 mg total) by mouth daily.  30 tablet  3  . gabapentin (NEURONTIN) 300 MG capsule TAKE 1 CAPSULE THREE TIMES A DAY  90 capsule  2  . HYDROcodone-acetaminophen (NORCO) 10-325 MG per tablet TAKE 1 TABLET BY MOUTH EVERY 4 HOURS AS NEEDED FOR PAIN  150 tablet  3  . insulin detemir (LEVEMIR) 100 UNIT/ML injection Inject under skin 60 units twice a day  60 mL  12  . Insulin Pen Needle 31G X 8 MM MISC Use as directed three times a day  300 each  2  . lansoprazole (PREVACID) 30 MG capsule Take 30 mg by mouth daily as needed. For heartburn      . Levomilnacipran HCl ER (FETZIMA) 40 MG CP24 Take 1 capsule by mouth daily.  30 capsule    . metFORMIN (GLUCOPHAGE-XR) 500 MG 24 hr tablet Take 2 tablets (1,000 mg total) by mouth 2 (two) times daily with a meal.  120 tablet  3  . metoprolol succinate (TOPROL-XL) 100 MG 24 hr tablet Take 1 tablet (100 mg total) by mouth 2 (two) times daily. Take with or immediately following a meal.  60 tablet  6  . nitroGLYCERIN (NITROSTAT) 0.4 MG SL tablet Place 1 tablet (0.4 mg total) under the tongue every 5 (five)  minutes as needed. For chest pain  30 tablet  6  . ONE TOUCH ULTRA TEST test strip USE 6 TIMES DAILY AS DIRECTED  200 each  2  . potassium chloride SA (KLOR-CON M20) 20 MEQ tablet 1 tablet twice daily  60 tablet  3  . spironolactone (ALDACTONE) 25 MG tablet TAKE 1 TABLET (25 MG TOTAL) BY MOUTH 2 (TWO) TIMES DAILY.  60 tablet  6  . telmisartan (MICARDIS) 40 MG tablet Take 1 tablet (40 mg total) by mouth daily.  90 tablet  3  . warfarin (COUMADIN) 6 MG tablet Take 3-6 mg by mouth daily. 6mg  on mondays 3 mg all other days       Current Facility-Administered Medications  Medication Dose Route Frequency Provider Last Rate Last Dose  . methylPREDNISolone acetate (DEPO-MEDROL) injection 40 mg  40 mg Intramuscular Once Stacie Glaze, MD        Allergies  Allergen Reactions  . Cleocin (Clindamycin Hcl) Diarrhea  . Codeine Itching  . Doxycycline Hyclate  Diarrhea  . Morphine Hives  . Pentazocine Lactate Itching and Nausea And Vomiting  . Vibramycin (Doxycycline Calcium) Diarrhea  . Sulfonamide Derivatives Rash    Past Medical History  Diagnosis Date  . CAD (coronary artery disease)   . PAF (paroxysmal atrial fibrillation)   . Hypertension   . Hyperlipidemia   . Hypokalemia   . Diabetes mellitus   . Depression   . Lumbar back pain   . LBBB (left bundle branch block)   . Headache(784.0)   . Lymphadenitis   . Arthritis   . Bursitis   . Chronic anticoagulation     on coumadin  . Heart palpitations   . Diastolic dysfunction     per echo in October 2012 with EF 50 to 55%  . 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 GERD (gastroesophageal reflux disease) 10/23/2003  . Fibromyalgia   . Blood transfusion without reported diagnosis     Past Surgical History  Procedure Laterality Date  . Angioplasty  laminectomy  . Lumbar laminectomy      x3  . Coronary artery bypass graft      LIMA to LAD   . Abdominal hysterectomy    . Cholecystectomy    . Tonsillectomy    . Knee arthroscopy    . Sphincterotomy      History  Smoking status  . Former Smoker  . Quit date: 03/07/2001  Smokeless tobacco  . Never Used    History  Alcohol Use No    Family History  Problem Relation Age of Onset  . Heart attack Father   . Heart disease Father   . Kidney disease    . Stroke    . Arthritis    . Hypertension    . Diabetes    . Stroke Mother   . Colon cancer Paternal Uncle     Review of Systems: The review of systems is per the HPI.  All other systems were reviewed and are negative.  Physical Exam: BP 120/80  Pulse 80  Ht 5\' 2"  (1.575 m)  Wt 225 lb 12.8 oz (102.422 kg)  BMI 41.29 kg/m2 Patient is a morbidly obese female who is alert and in no acute distress. Her affect is flat. Skin is warm and dry. Color is normal.  HEENT is unremarkable. Normocephalic/atraumatic. PERRL. Sclera are nonicteric. Neck is supple. No masses. No  JVD. Lungs are clear. Cardiac exam shows a regular rate and  rhythm. Abdomen is obese but soft. Extremities are without edema. Gait and ROM are intact. No gross neurologic deficits noted.  LABORATORY DATA:  Lab Results  Component Value Date   WBC 7.0 08/15/2012   HGB 13.9 08/15/2012   HCT 41.0 08/15/2012   PLT 295.0 08/15/2012   GLUCOSE 92 08/15/2012   CHOL 187 02/28/2011   TRIG 165.0* 02/28/2011   HDL 35.80* 02/28/2011   LDLDIRECT 116.0 11/28/2008   LDLCALC 118* 02/28/2011   ALT 27 02/28/2011   AST 25 02/28/2011   NA 143 08/15/2012   K 3.8 08/15/2012   CL 107 08/15/2012   CREATININE 0.8 08/15/2012   BUN 14 08/15/2012   CO2 25 08/15/2012   TSH 1.63 05/07/2012   INR 2.9 09/20/2012   HGBA1C 7.6* 08/30/2012   MICROALBUR 0.2 03/19/2008     ANGIOGRAPHIC DATA FROM 2010:  1. The left main is free of critical disease.  2. The LAD has about 20% proximal narrowing. The vessel then opens up  and then provides a large bifurcating diagonal, just after the  diagonal was about 30-40% narrowing. There was bidirectional flow  in the distal LAD where the graft is inserted. The vessel was  widely patent distally.  3. The internal mammary to the distal LAD is widely patent.  4. The circumflex provides a large proximal marginal, a smaller second  marginal and then a third larger marginal, there was a  posterolateral branch as well. No areas of high-grade obstruction  was noted.  5. The right coronary artery provides a PDA and a small posterolateral  branch. This appears to be free of critical disease.   IMPRESSION:  1. Continued patency of the internal mammary to the left anterior  descending.  2. Patency of the left anterior descending with less severe stenosis  now on the left anterior descending.  3. No other high-grade obstruction.   PLAN: We will plan to treat the patient medically. She will follow up  in the office with me.  Arturo Morton. Riley Kill, MD, [facility]  Electronically Signed   TDS/MEDQ D:  04/04/2008 T: 04/05/2008 Job: [phone]  Assessment / Plan: 1. Palpitations with history of PAF - I do not see any documentation of recent atrial fib over the past year or so - her spells are happening on a daily basis - will place Holter. Already on maximum dose of beta blocker - may need antiarrhythmic therapy - will get EP to see.   2. CAD - past CABG  3. Prior stroke - remains on anticoagulation - last INR was therapeutic.  4. HTN - blood pressure looks fine on her current regimen.   She is tentatively seeing Dr. Clifton James back in October. I have made no medicine changes today.   Patient is agreeable to this plan and will call if any problems develop in the interim.   Rosalio Macadamia, RN, ANP-C [facility] [address] [place], [place]  [address]

## 2012-10-04 ENCOUNTER — Other Ambulatory Visit: Payer: Self-pay | Admitting: Internal Medicine

## 2012-10-05 ENCOUNTER — Other Ambulatory Visit: Payer: Self-pay | Admitting: Cardiology

## 2012-10-12 ENCOUNTER — Telehealth: Payer: Self-pay | Admitting: *Deleted

## 2012-10-12 NOTE — Telephone Encounter (Signed)
If she has any skin breakage/ulcers, might need to be seen by PCP. Otherwise, try to rinse with milk. Eat softer foods for 1-2 days.

## 2012-10-12 NOTE — Telephone Encounter (Signed)
Pt called and lvm stating she at hot soup last night and burned the roof of her mouth and throat. It hurts to swallow. Pt asked if there is anything that she could rinse her mouth in to help or does she need an abx. Please advise.

## 2012-10-12 NOTE — Telephone Encounter (Signed)
Called pt and advised her per Dr Elvera Lennox that if she has any skin breakage/ulcers, she might need to be seen by her PCP. Otherwise, try to rinse mouth with milk and eat softer food for 1-2 days. Pt understood.

## 2012-10-15 ENCOUNTER — Telehealth: Payer: Self-pay | Admitting: Internal Medicine

## 2012-10-15 NOTE — Telephone Encounter (Signed)
Patient Information:  Caller Name: [place]  Phone: [phone]  Patient: Kristen [name]  Gender: Female  DOB: [birth year]  Age: 67 Years  PCP: Darryll Capers (Adults only)  Office Follow Up:  Does the office need to follow up with this patient?: No  Instructions For The Office: N/A  RN Note:  FBS not tested. Sore throat pain rated 6/10.  Bilateral ear pain; especially when swallows. Using Hydrocodone for pain. Requested appointment for 10/16/12 due to transportation issue.    Symptoms  Reason For Call & Symptoms: Sore throat  Reviewed Health History In EMR: Yes  Reviewed Medications In EMR: Yes  Reviewed Allergies In EMR: No  Reviewed Surgeries / Procedures: Yes  Date of Onset of Symptoms: 10/12/2012  Treatments Tried: Gargling with milk, Biotene mouth wash, Hydrocodone  Treatments Tried Worked: Yes  Guideline(s) Used:  Sore Throat  Disposition Per Guideline:   See Today in Office  Reason For Disposition Reached:   Severe sore throat pain  Advice Given:  For Relief of Sore Throat Pain:  Sip warm chicken broth or apple juice.  Gargle warm salt water 3 times daily (1 teaspoon of salt in 8 oz or 240 ml of warm water).  Soft Diet:   Cold drinks and milk shakes are especially good (Reason: swollen tonsils can make some foods hard to swallow).  Liquids:  Adequate liquid intake is important to prevent dehydration. Drink 6-8 glasses of water per day.  Expected Course:  Sore throats with viral illnesses usually last 3 or 4 days.  Call Back If:  Sore throat is the main symptom and it lasts longer than 24 hours  Fever lasts longer than 3 days  You become worse.  Patient Will Follow Care Advice:  YES  Appointment Scheduled:  10/16/2012 11:30:00 Appointment Scheduled Provider:  Adline Mango [facility])

## 2012-10-16 ENCOUNTER — Ambulatory Visit (INDEPENDENT_AMBULATORY_CARE_PROVIDER_SITE_OTHER): Payer: Medicare Other | Admitting: Family

## 2012-10-16 ENCOUNTER — Encounter: Payer: Self-pay | Admitting: Family

## 2012-10-16 VITALS — BP 120/78 | Temp 98.9°F | Wt 228.0 lb

## 2012-10-16 DIAGNOSIS — T280XXA Burn of mouth and pharynx, initial encounter: Secondary | ICD-10-CM

## 2012-10-16 DIAGNOSIS — E119 Type 2 diabetes mellitus without complications: Secondary | ICD-10-CM

## 2012-10-16 MED ORDER — PREDNISONE 10 MG PO TABS: 10.0000 mg | ORAL_TABLET | Freq: Every day | ORAL | Status: DC

## 2012-10-16 MED ORDER — MAGIC MOUTHWASH W/LIDOCAINE: 5.0000 mL | Freq: Three times a day (TID) | ORAL | Status: DC

## 2012-10-16 NOTE — Progress Notes (Signed)
Subjective:    Patient ID: Kristen [name], female    DOB: February 20, 1946, 67 y.o.   MRN: [id number]  HPI 67 year old African American female, nonsmoker is in with complaints of a burn to the right her mouth x4 days after eating high mashed potatoes. She's been using saltwater gargles that have not helped. She has a history of type 2 diabetes has been decently controlled blood sugar this morning the 100 A1c 7.6.   Review of Systems  Constitutional: Negative.   HENT: Negative.        Burn to the roof of the mouth  Respiratory: Negative.   Cardiovascular: Negative.   Gastrointestinal: Negative.   Endocrine: Negative.   Musculoskeletal: Negative.   Skin: Negative.   Allergic/Immunologic: Negative.   Psychiatric/Behavioral: Negative.    Past Medical History  Diagnosis Date  . CAD (coronary artery disease)   . PAF (paroxysmal atrial fibrillation)   . Hypertension   . Hyperlipidemia   . Hypokalemia   . Diabetes mellitus   . Depression   . Lumbar back pain   . LBBB (left bundle branch block)   . Headache(784.0)   . Lymphadenitis   . Arthritis   . Bursitis   . Chronic anticoagulation     on coumadin  . Heart palpitations   . Diastolic dysfunction     per echo in October 2012 with EF 50 to 55%  . 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 GERD (gastroesophageal reflux disease) 10/23/2003  . Fibromyalgia   . Blood transfusion without reported diagnosis     History   Social History  . Marital Status: Legally Separated    Spouse Name: N/A    Number of Children: N/A  . Years of Education: N/A   Occupational History  . Not on file.   Social History Main Topics  . Smoking status: Former Smoker    Quit date: 03/07/2001  . Smokeless tobacco: Never Used  . Alcohol Use: No  . Drug Use: No  . Sexually Active: Not on file   Other Topics Concern  . Not on file   Social History Narrative  . No narrative on file    Past Surgical History  Procedure Laterality Date  .  Angioplasty  laminectomy  . Lumbar laminectomy      x3  . Coronary artery bypass graft      LIMA to LAD   . Abdominal hysterectomy    . Cholecystectomy    . Tonsillectomy    . Knee arthroscopy    . Sphincterotomy      Family History  Problem Relation Age of Onset  . Heart attack Father   . Heart disease Father   . Kidney disease    . Stroke    . Arthritis    . Hypertension    . Diabetes    . Stroke Mother   . Colon cancer Paternal Uncle     Allergies  Allergen Reactions  . Cleocin (Clindamycin Hcl) Diarrhea  . Codeine Itching  . Doxycycline Hyclate Diarrhea  . Morphine Hives  . Pentazocine Lactate Itching and Nausea And Vomiting  . Vibramycin (Doxycycline Calcium) Diarrhea  . Sulfonamide Derivatives Rash    Current Outpatient Prescriptions on File Prior to Visit  Medication Sig Dispense Refill  . ALPRAZolam (XANAX) 1 MG tablet TAKE 1 TABLET BY MOUTH 3 TIMES A DAY AS NEEDED  90 tablet  0  . aspirin 81 MG tablet Take 81 mg by mouth daily.        Marland Kitchen  atorvastatin (LIPITOR) 20 MG tablet TAKE 2 TABLETS BY MOUTH DAILY  60 tablet  11  . clotrimazole-betamethasone (LOTRISONE) cream Apply topically 2 (two) times daily.  45 g  0  . cyclobenzaprine (FLEXERIL) 10 MG tablet Take 10 mg by mouth 2 (two) times daily as needed. For muscle spasms      . digoxin (LANOXIN) 0.25 MG tablet TAKE 1 TABLET EVERY DAY  90 tablet  2  . furosemide (LASIX) 20 MG tablet Take 1 tablet (20 mg total) by mouth daily.  30 tablet  3  . gabapentin (NEURONTIN) 300 MG capsule TAKE 1 CAPSULE THREE TIMES A DAY  90 capsule  2  . HYDROcodone-acetaminophen (NORCO) 10-325 MG per tablet TAKE 1 TABLET BY MOUTH EVERY 4 HOURS AS NEEDED FOR PAIN  150 tablet  3  . insulin detemir (LEVEMIR) 100 UNIT/ML injection Inject under skin 60 units twice a day  60 mL  12  . Insulin Pen Needle 31G X 8 MM MISC Use as directed three times a day  300 each  2  . lansoprazole (PREVACID) 30 MG capsule Take 30 mg by mouth daily as needed.  For heartburn      . Levomilnacipran HCl ER (FETZIMA) 40 MG CP24 Take 1 capsule by mouth daily.  30 capsule    . metFORMIN (GLUCOPHAGE-XR) 500 MG 24 hr tablet Take 2 tablets (1,000 mg total) by mouth 2 (two) times daily with a meal.  120 tablet  3  . metoprolol succinate (TOPROL-XL) 100 MG 24 hr tablet TAKE 1 TABLET BY MOUTH TWICE A DAY TAKE WITH OR IMMEDIATELY FOLLOWING MEALS  60 tablet  6  . nitroGLYCERIN (NITROSTAT) 0.4 MG SL tablet Place 1 tablet (0.4 mg total) under the tongue every 5 (five) minutes as needed. For chest pain  30 tablet  6  . ONE TOUCH ULTRA TEST test strip USE 6 TIMES DAILY AS DIRECTED  200 each  2  . potassium chloride SA (KLOR-CON M20) 20 MEQ tablet 1 tablet twice daily  60 tablet  3  . spironolactone (ALDACTONE) 25 MG tablet TAKE 1 TABLET (25 MG TOTAL) BY MOUTH 2 (TWO) TIMES DAILY.  60 tablet  6  . telmisartan (MICARDIS) 40 MG tablet Take 1 tablet (40 mg total) by mouth daily.  90 tablet  3  . warfarin (COUMADIN) 6 MG tablet Take 3-6 mg by mouth daily. 6mg  on mondays 3 mg all other days       No current facility-administered medications on file prior to visit.    BP 120/78  Temp(Src) 98.9 F (37.2 C) (Oral)  Wt 228 lb (103.42 kg)  BMI 41.69 kg/m2chart    Objective:   Physical Exam  Constitutional: She is oriented to person, place, and time. She appears well-developed and well-nourished.  HENT:  Right Ear: External ear normal.  Left Ear: External ear normal.  Nose: Nose normal.  .5cm burn noter the the posterior hard palate.   Neck: Normal range of motion. Neck supple.  Cardiovascular: Normal rate, regular rhythm and normal heart sounds.   Pulmonary/Chest: Effort normal and breath sounds normal.  Neurological: She is alert and oriented to person, place, and time.  Skin: Skin is warm and dry.  Psychiatric: She has a normal mood and affect.          Assessment & Plan:  Assessment: 1.Burn to the hard palate 2. Type 2 diabetes   Plan: Prednisone 10 mg  once daily. Magic mouthwash gargle, swish, swallow 3 times a  day. Patient by a physician for surfaces. Recheck as scheduled, and as needed.

## 2012-10-19 ENCOUNTER — Ambulatory Visit (INDEPENDENT_AMBULATORY_CARE_PROVIDER_SITE_OTHER): Payer: Medicare Other | Admitting: General Practice

## 2012-10-19 DIAGNOSIS — I4891 Unspecified atrial fibrillation: Secondary | ICD-10-CM

## 2012-10-19 DIAGNOSIS — Z7901 Long term (current) use of anticoagulants: Secondary | ICD-10-CM

## 2012-10-23 ENCOUNTER — Other Ambulatory Visit: Payer: Self-pay | Admitting: Cardiology

## 2012-10-24 ENCOUNTER — Ambulatory Visit (INDEPENDENT_AMBULATORY_CARE_PROVIDER_SITE_OTHER): Payer: Medicare Other | Admitting: Internal Medicine

## 2012-10-24 ENCOUNTER — Encounter: Payer: Self-pay | Admitting: Internal Medicine

## 2012-10-24 VITALS — BP 134/70 | HR 85 | Ht 62.0 in | Wt 224.4 lb

## 2012-10-24 DIAGNOSIS — R0789 Other chest pain: Secondary | ICD-10-CM

## 2012-10-24 DIAGNOSIS — G4710 Hypersomnia, unspecified: Secondary | ICD-10-CM

## 2012-10-24 DIAGNOSIS — R5383 Other fatigue: Secondary | ICD-10-CM

## 2012-10-24 DIAGNOSIS — I2510 Atherosclerotic heart disease of native coronary artery without angina pectoris: Secondary | ICD-10-CM

## 2012-10-24 DIAGNOSIS — R5381 Other malaise: Secondary | ICD-10-CM

## 2012-10-24 DIAGNOSIS — R0609 Other forms of dyspnea: Secondary | ICD-10-CM | POA: Insufficient documentation

## 2012-10-24 DIAGNOSIS — I493 Ventricular premature depolarization: Secondary | ICD-10-CM | POA: Insufficient documentation

## 2012-10-24 MED ORDER — METOPROLOL TARTRATE 100 MG PO TABS: 100.0000 mg | ORAL_TABLET | Freq: Two times a day (BID) | ORAL | Status: DC

## 2012-10-24 MED ORDER — ATENOLOL 100 MG PO TABS: 100.0000 mg | ORAL_TABLET | Freq: Two times a day (BID) | ORAL | Status: DC

## 2012-10-24 MED ORDER — CARVEDILOL 25 MG PO TABS: 25.0000 mg | ORAL_TABLET | Freq: Two times a day (BID) | ORAL | Status: DC

## 2012-10-24 MED ORDER — PROPRANOLOL HCL ER 80 MG PO CP24: 160.0000 mg | ORAL_CAPSULE | Freq: Every day | ORAL | Status: DC

## 2012-10-24 NOTE — Assessment & Plan Note (Signed)
We have discussed depression and I have recommended taht she consider restoration place counsleing

## 2012-10-24 NOTE — Progress Notes (Signed)
ELECTROPHYSIOLOGY CONSULT NOTE  Patient ID: Kristen [name], MRN: [id number], DOB/AGE: June 01, 1945 67 y.o. Admit date: (Not on file) Date of Consult: 10/24/2012  Primary Physician: Carrie Mew, MD Primary Cardiologist:TS Chief Complaint:  ? afib   HPI Kristen [name] is a 67 y.o. female   Referred for palpitations with a history of PAF. She has a history of a prior stroke and is on anticoagulation  She was seen recently by LG who referred her for this evaluation. She is having multiple palpitations  She describes these as flutters skips and dropped beats which he sometimes feels in her throat.Holter monitoring demonstrated PACs and nonsustained ventricular tachycardia  She also has complains of significant fatigue as well as exercise intolerance. It is difficult for her over the one flight of stairs to her apartment without shortness of breath. There is occasional chest heaviness. She has some edema as well as some abdominal fullness.  She also probably has sleep apnea. She has a prior history of a sleep study that was nondiagnostic. She has daytime somnolence and obstructive breathing.  August is a lousy month for her. There are birthdays and death days of important family members.  She struggled with anxiety and depression.  She used to work at [employer] for 31 years. She says that she remembers when I came.   Thromboembolic risk factors are notable for diabetes, hypertension and prior stroke for a CHADS- score a 4 and a CHADS-VASc score of 6   She is a history of ischemic heart disease  With CABG  surgery in 2004. Catheterization 2010 was normal.     Echocardiogram 10/12 demonstrated normal left ventricular function with ejection fraction of 50-55%. Left atrial dimension was described as normal    Past Medical History  Diagnosis Date  . CAD (coronary artery disease)   . PAF (paroxysmal atrial fibrillation)   . Hypertension   . Hyperlipidemia   .  Hypokalemia   . Diabetes mellitus   . Depression   . Lumbar back pain   . LBBB (left bundle branch block)   . Headache(784.0)   . Lymphadenitis   . Arthritis   . Bursitis   . Chronic anticoagulation     on coumadin  . Heart palpitations   . Diastolic dysfunction     per echo in October 2012 with EF 50 to 55%  . 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 GERD (gastroesophageal reflux disease) 10/23/2003  . Fibromyalgia   . Blood transfusion without reported diagnosis       Surgical History:  Past Surgical History  Procedure Laterality Date  . Angioplasty  laminectomy  . Lumbar laminectomy      x3  . Coronary artery bypass graft      LIMA to LAD   . Abdominal hysterectomy    . Cholecystectomy    . Tonsillectomy    . Knee arthroscopy    . Sphincterotomy       Home Meds: Prior to Admission medications   Medication Sig Start Date End Date Taking? Authorizing Provider  ALPRAZolam Prudy Feeler) 1 MG tablet TAKE 1 TABLET BY MOUTH 3 TIMES A DAY AS NEEDED 10/04/12  Yes Stacie Glaze, MD  Alum & Mag Hydroxide-Simeth (MAGIC MOUTHWASH W/LIDOCAINE) SOLN Take 5 mLs by mouth 3 (three) times daily as needed. 10/16/12  Yes Baker Pierini, FNP  aspirin 81 MG tablet Take 81 mg by mouth daily.     Yes Historical Provider, MD  atorvastatin (LIPITOR)  20 MG tablet TAKE 2 TABLETS BY MOUTH DAILY 04/23/12  Yes Stacie Glaze, MD  clotrimazole-betamethasone (LOTRISONE) cream Apply topically 2 (two) times daily. 04/02/12  Yes Stacie Glaze, MD  cyclobenzaprine (FLEXERIL) 10 MG tablet Take 10 mg by mouth 2 (two) times daily as needed. For muscle spasms   Yes Historical Provider, MD  digoxin (LANOXIN) 0.25 MG tablet TAKE 1 TABLET EVERY DAY 02/21/12  Yes Stacie Glaze, MD  furosemide (LASIX) 20 MG tablet Take 1 tablet (20 mg total) by mouth daily. 08/31/12  Yes Stacie Glaze, MD  gabapentin (NEURONTIN) 300 MG capsule TAKE 1 CAPSULE THREE TIMES A DAY 07/22/12  Yes Stacie Glaze, MD    HYDROcodone-acetaminophen (NORCO) 10-325 MG per tablet TAKE 1 TABLET BY MOUTH EVERY 4 HOURS AS NEEDED FOR PAIN 09/02/12  Yes Stacie Glaze, MD  insulin detemir (LEVEMIR) 100 UNIT/ML injection Inject under skin 60 units twice a day 08/30/12  Yes Carlus Pavlov, MD  Insulin Pen Needle 31G X 8 MM MISC Use as directed three times a day 10/13/11  Yes Romero Belling, MD  lansoprazole (PREVACID) 30 MG capsule Take 30 mg by mouth daily as needed. For heartburn   Yes Historical Provider, MD  Levomilnacipran HCl ER (FETZIMA) 40 MG CP24 Take 1 capsule by mouth daily. 05/07/12  Yes Stacie Glaze, MD  metFORMIN (GLUCOPHAGE-XR) 500 MG 24 hr tablet Take 2 tablets (1,000 mg total) by mouth 2 (two) times daily with a meal. 08/30/12  Yes Carlus Pavlov, MD  metoprolol succinate (TOPROL-XL) 100 MG 24 hr tablet TAKE 1 TABLET BY MOUTH TWICE A DAY TAKE WITH OR IMMEDIATELY FOLLOWING MEALS 10/05/12  Yes Kathleene Hazel, MD  nitroGLYCERIN (NITROSTAT) 0.4 MG SL tablet Place 1 tablet (0.4 mg total) under the tongue every 5 (five) minutes as needed. For chest pain 07/24/12  Yes Kathleene Hazel, MD  ONE [facility] ULTRA TEST test strip USE 6 TIMES DAILY AS DIRECTED 07/31/12  Yes Stacie Glaze, MD  potassium chloride SA (K-DUR,KLOR-CON) 20 MEQ tablet 1 TABLET TWICE DAILY 10/23/12  Yes Herby Abraham, MD  spironolactone (ALDACTONE) 25 MG tablet TAKE 1 TABLET (25 MG TOTAL) BY MOUTH 2 (TWO) TIMES DAILY. 05/03/12  Yes Stacie Glaze, MD  telmisartan (MICARDIS) 40 MG tablet Take 1 tablet (40 mg total) by mouth daily. 08/20/12  Yes Stacie Glaze, MD  warfarin (COUMADIN) 6 MG tablet Take 3-6 mg by mouth daily. 6mg  on mondays 3 mg all other days   Yes Historical Provider, MD       Allergies:  Allergies  Allergen Reactions  . Cleocin [Clindamycin Hcl] Diarrhea  . Codeine Itching  . Doxycycline Hyclate Diarrhea  . Morphine Hives  . Pentazocine Lactate Itching and Nausea And Vomiting  . Vibramycin [Doxycycline Calcium]  Diarrhea  . Sulfonamide Derivatives Rash    History   Social History  . Marital Status: Legally Separated    Spouse Name: N/A    Number of Children: N/A  . Years of Education: N/A   Occupational History  . Not on file.   Social History Main Topics  . Smoking status: Former Smoker    Quit date: 03/07/2001  . Smokeless tobacco: Never Used  . Alcohol Use: No  . Drug Use: No  . Sexual Activity: Not on file   Other Topics Concern  . Not on file   Social History Narrative  . No narrative on file     Family History  Problem  Relation Age of Onset  . Heart attack Father   . Heart disease Father   . Kidney disease    . Stroke    . Arthritis    . Hypertension    . Diabetes    . Stroke Mother   . Colon cancer Paternal Uncle      ROS:  Please see the history of present illness.  Except diaphoresis   All other systems reviewed and negative.    Physical Exam: Blood pressure 134/70, pulse 85, height 5\' 2"  (1.575 m), weight 224 lb 6.4 oz (101.787 kg). General: Well developed, well nourished obse female in no acute distress. Head: Normocephalic, atraumatic, sclera non-icteric, no xanthomas, nares are without discharge. EENT: normal Lymph Nodes:  none Back: without scoliosis/kyphosis , no CVA tendersness Neck: Negative for carotid bruits. JVD not elevated. Lungs: Clear bilaterally to auscultation without wheezes, rales, or rhonchi. Breathing is unlabored. Heart: RRR with S1 S2. No  murmur , rubs, or gallops appreciated. Abdomen: Soft, non-tender, non-distended with normoactive bowel sounds. No hepatomegaly. No rebound/guarding. No obvious abdominal masses. Msk:  Strength and tone appear normal for age. Extremities: No clubbing or cyanosis. No edema.  Distal pedal pulses are 2+ and equal bilaterally. Skin: Warm and Dry Neuro: Alert and oriented X 3. CN III-XII intact Grossly normal sensory and motor function . Psych:  Responds to questions appropriately with a normal  affect.      Labs: Cardiac Enzymes No results found for this basename: CKTOTAL, CKMB, TROPONINI,  in the last 72 hours CBC Lab Results  Component Value Date   WBC 7.0 08/15/2012   HGB 13.9 08/15/2012   HCT 41.0 08/15/2012   MCV 94.7 08/15/2012   PLT 295.0 08/15/2012   PROTIME: No results found for this basename: LABPROT, INR,  in the last 72 hours Chemistry No results found for this basename: NA, K, CL, CO2, BUN, CREATININE, CALCIUM, LABALBU, PROT, BILITOT, ALKPHOS, ALT, AST, GLUCOSE,  in the last 168 hours Lipids Lab Results  Component Value Date   CHOL 187 02/28/2011   HDL 35.80* 02/28/2011   LDLCALC 118* 02/28/2011   TRIG 165.0* 02/28/2011   BNP Pro B Natriuretic peptide (BNP)  Date/Time Value Range Status  01/09/2012  4:42 PM 18.2  0 - 125 pg/mL Final  12/14/2010 10:40 AM 22.0  0 - 125 pg/mL Final  01/27/2008  3:29 AM <30.0   Final   Miscellaneous Lab Results  Component Value Date   DDIMER  Value: <0.22        AT THE INHOUSE ESTABLISHED CUTOFF VALUE OF 0.48 ug/mL FEU, THIS ASSAY HAS BEEN DOCUMENTED IN THE LITERATURE TO HAVE 08/24/2006    Radiology/Studies:  No results found.  EKG: sinus rhythm at 85 intervals 15/15/38 axis is left -8   Assessment and Plan:    Sherryl Manges

## 2012-10-24 NOTE — Assessment & Plan Note (Signed)
This could be multifactorial including depression OSA and drugs With her prior sleep study that was mildly abnormal some years ago, I think is reasonable to pursue a repeat sleep study.

## 2012-10-24 NOTE — Assessment & Plan Note (Signed)
The patient has a diagnosis of atrial fibrillation and has frequent symptoms of palpitations that she has thought were atrial fibrillation although Holter monitoring recently did not confirm the more recent association. She has a history of a prior stroke. She is appropriately on warfarin. We will discontinue aspirin.

## 2012-10-24 NOTE — Assessment & Plan Note (Signed)
As above.

## 2012-10-24 NOTE — Patient Instructions (Addendum)
Your physician has recommended you make the following change in your medication:   1. Stop Aspirin 2. Stop Digoxin  Start as a 30 trail :   Carvedilol 25MG  twice daily  Atenolol 100MG  once daily  Propranolol 80MG , 2 tablets once daily  Your physician has recommended that you have a split night sleep study. This test records several body functions during sleep, including: brain activity, eye movement, oxygen and carbon dioxide blood levels, heart rate and rhythm, breathing rate and rhythm, the flow of air through your mouth and nose, snoring, body muscle movements, and chest and belly movement.  Your physician has requested that you have a lexiscan myoview. For further information please visit [email or web address] Please follow instruction sheet, as given.

## 2012-10-24 NOTE — Assessment & Plan Note (Signed)
She has significant dyspnea on exertion which may represent HFpEF. There may also be an ischemic component given her known coronary artery disease and a number of years since her last evaluation. There is some accompanied chest discomfort increasing this likelihood. We'll undertake a Myoview scan.

## 2012-10-24 NOTE — Assessment & Plan Note (Signed)
She is symptomatic PVCs and nonsustained ventricular tachycardia. We have reviewed the minimal prognostic implications in the context of normal left ventricular function. She is currently on beta blockers. This may be contributing to her fatigue  We will try different beta blockers. I've given her a prescription for a lower dose atenolol 100, metoprolol tartrate 100 twice a day, and Inderal LA 160 to see if any of these are associated with less fatigue

## 2012-10-29 ENCOUNTER — Telehealth: Payer: Self-pay | Admitting: Internal Medicine

## 2012-10-29 NOTE — Telephone Encounter (Signed)
Pt is calling because she was seen by Dr Graciela Husbands on 10/24/12 and had her medication changed (pt was taken off of Metoprolol and started on Carvediol)pt states she is feeling sick and light headed and wants to know what to do.  RN advised for pt to speak with Dr Graciela Husbands about medications and how pt is feeling.  Pt states she has called them this morning but has not heard back.  Pt will call back to Dr Koren Bound office now.

## 2012-10-29 NOTE — Telephone Encounter (Signed)
ook

## 2012-10-29 NOTE — Telephone Encounter (Signed)
Follow up  Pt states that the medication she was given is making her sick. She would to speak with a nurse as soon as possible because she cannot take her medication because it is making her sick and she wants to know what she needs to do.

## 2012-10-29 NOTE — Telephone Encounter (Signed)
Spoke with pt who c/o weakness, not feeling well all weekend since starting new medication (carvedilol), and oxygen saturation dropping to 88% and quickly returning to 93% (where she states it averages staying). Pt states that she only took half tablet of Carvedilol today and is tired of feeling this way. I advised her to stop the Carvedilol and start Atenolol tomorrow. I also advised her that if her oxygen drops below 88% and stays for a period of time or if she has any worsening symptoms to call us back.

## 2012-10-29 NOTE — Telephone Encounter (Signed)
New Problem  Pt called has taken new medication as of Friday 8/22 and ever since has been sick, light headed and fatigued also states oxygen level has dropped. pt is afraid to take the medication now..   Pt also states she is ready to take her old BP and heart medication (metotrol succ er)  because her heart rate has been up//wants a call back to discuss her symptoms and medication intake going forward.

## 2012-10-30 NOTE — Telephone Encounter (Signed)
New prob  Pt states that the diagnosis for her sleep study is wrong. She said she suffers from insomnia. She asked if the diagnosis can be changed.

## 2012-10-31 NOTE — Telephone Encounter (Signed)
New problem   Pt stated she called answering service on Saturday and pt don't remember who she talked to/. Pt was wondering if someone could tell her who called her. Please call pt.

## 2012-11-01 ENCOUNTER — Institutional Professional Consult (permissible substitution): Payer: Medicare Other | Admitting: Internal Medicine

## 2012-11-02 ENCOUNTER — Ambulatory Visit (HOSPITAL_BASED_OUTPATIENT_CLINIC_OR_DEPARTMENT_OTHER): Payer: Medicare Other | Attending: Internal Medicine

## 2012-11-02 ENCOUNTER — Other Ambulatory Visit: Payer: Self-pay | Admitting: Internal Medicine

## 2012-11-02 VITALS — Ht 62.0 in | Wt 224.0 lb

## 2012-11-02 DIAGNOSIS — G4733 Obstructive sleep apnea (adult) (pediatric): Secondary | ICD-10-CM

## 2012-11-02 DIAGNOSIS — G4710 Hypersomnia, unspecified: Secondary | ICD-10-CM | POA: Insufficient documentation

## 2012-11-06 ENCOUNTER — Encounter (HOSPITAL_COMMUNITY): Payer: Self-pay | Admitting: Emergency Medicine

## 2012-11-06 ENCOUNTER — Telehealth: Payer: Self-pay | Admitting: *Deleted

## 2012-11-06 ENCOUNTER — Emergency Department (HOSPITAL_COMMUNITY)
Admission: EM | Admit: 2012-11-06 | Discharge: 2012-11-06 | Disposition: A | Discharge status: Home / Self Care | Payer: Medicare Other | Source: Home / Self Care

## 2012-11-06 DIAGNOSIS — M7750 Other enthesopathy of unspecified foot: Secondary | ICD-10-CM

## 2012-11-06 DIAGNOSIS — M7742 Metatarsalgia, left foot: Secondary | ICD-10-CM

## 2012-11-06 DIAGNOSIS — L03031 Cellulitis of right toe: Secondary | ICD-10-CM

## 2012-11-06 DIAGNOSIS — L03039 Cellulitis of unspecified toe: Secondary | ICD-10-CM

## 2012-11-06 MED ORDER — CEPHALEXIN 500 MG PO CAPS: 500.0000 mg | ORAL_CAPSULE | Freq: Four times a day (QID) | ORAL | Status: DC

## 2012-11-06 NOTE — ED Provider Notes (Signed)
Kristen [name] is a 67 y.o. female who presents to Urgent Care today for  1) right great toe pain: Patient has pain and clear discharge for the medial border of her right great toenail. This is been present for several days and is somewhat worsening. She has a history of ingrown toenail. This is similar but not as severe as it has been in the past. She has an appointment with a podiatrist next week. She feels well otherwise no fevers or chills.  2) left second and third metatarsal head pain: Present for sometime worse with walking better with rest. She denies any radiating pain weakness numbness.    Past Medical History  Diagnosis Date  . CAD (coronary artery disease)   . PAF (paroxysmal atrial fibrillation)   . Hypertension   . Hyperlipidemia   . Hypokalemia   . Diabetes mellitus   . Depression   . Lumbar back pain   . LBBB (left bundle branch block)   . Headache(784.0)   . Lymphadenitis   . Arthritis   . Bursitis   . Chronic anticoagulation     on coumadin  . Heart palpitations   . Diastolic dysfunction     per echo in October 2012 with EF 50 to 55%  . 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 GERD (gastroesophageal reflux disease) 10/23/2003  . Fibromyalgia   . Blood transfusion without reported diagnosis    History  Substance Use Topics  . Smoking status: Former Smoker    Quit date: 03/07/2001  . Smokeless tobacco: Never Used  . Alcohol Use: No   ROS as above: No nausea vomiting diarrhea chest pain palpitations trouble breathing Medications reviewed. No current facility-administered medications for this encounter.   Current Outpatient Prescriptions  Medication Sig Dispense Refill  . ALPRAZolam (XANAX) 1 MG tablet TAKE 1 TABLET 3 TIMES A DAY AS NEEDED  90 tablet  3  . Alum & Mag Hydroxide-Simeth (MAGIC MOUTHWASH W/LIDOCAINE) SOLN Take 5 mLs by mouth 3 (three) times daily as needed.      Marland Kitchen atenolol (TENORMIN) 100 MG tablet Take 1 tablet (100 mg total) by mouth 2  (two) times daily.  30 tablet  0  . atorvastatin (LIPITOR) 20 MG tablet TAKE 2 TABLETS BY MOUTH DAILY  60 tablet  11  . carvedilol (COREG) 25 MG tablet Take 1 tablet (25 mg total) by mouth 2 (two) times daily with a meal.  60 tablet  0  . cephALEXin (KEFLEX) 500 MG capsule Take 1 capsule (500 mg total) by mouth 4 (four) times daily.  28 capsule  0  . clotrimazole-betamethasone (LOTRISONE) cream Apply topically 2 (two) times daily.  45 g  0  . cyclobenzaprine (FLEXERIL) 10 MG tablet Take 10 mg by mouth 2 (two) times daily as needed. For muscle spasms      . furosemide (LASIX) 20 MG tablet Take 1 tablet (20 mg total) by mouth daily.  30 tablet  3  . gabapentin (NEURONTIN) 300 MG capsule TAKE 1 CAPSULE THREE TIMES A DAY  90 capsule  2  . HYDROcodone-acetaminophen (NORCO) 10-325 MG per tablet TAKE 1 TABLET BY MOUTH EVERY 4 HOURS AS NEEDED FOR PAIN  150 tablet  3  . insulin detemir (LEVEMIR) 100 UNIT/ML injection Inject under skin 60 units twice a day  60 mL  12  . Insulin Pen Needle 31G X 8 MM MISC Use as directed three times a day  300 each  2  .  lansoprazole (PREVACID) 30 MG capsule Take 30 mg by mouth daily as needed. For heartburn      . Levomilnacipran HCl ER (FETZIMA) 40 MG CP24 Take 1 capsule by mouth daily.  30 capsule    . metFORMIN (GLUCOPHAGE-XR) 500 MG 24 hr tablet Take 2 tablets (1,000 mg total) by mouth 2 (two) times daily with a meal.  120 tablet  3  . nitroGLYCERIN (NITROSTAT) 0.4 MG SL tablet Place 1 tablet (0.4 mg total) under the tongue every 5 (five) minutes as needed. For chest pain  30 tablet  6  . ONE TOUCH ULTRA TEST test strip USE 6 TIMES DAILY AS DIRECTED  200 each  2  . potassium chloride SA (K-DUR,KLOR-CON) 20 MEQ tablet 1 TABLET TWICE DAILY  60 tablet  6  . propranolol ER (INDERAL LA) 80 MG 24 hr capsule Take 2 capsules (160 mg total) by mouth daily.  60 capsule  0  . spironolactone (ALDACTONE) 25 MG tablet TAKE 1 TABLET (25 MG TOTAL) BY MOUTH 2 (TWO) TIMES DAILY.  60  tablet  6  . telmisartan (MICARDIS) 40 MG tablet Take 1 tablet (40 mg total) by mouth daily.  90 tablet  3  . warfarin (COUMADIN) 6 MG tablet Take 3-6 mg by mouth daily. 6mg  on mondays 3 mg all other days        Exam:  BP 120/62  Pulse 81  Temp(Src) 98.1 F (36.7 C) (Oral)  Resp 16  SpO2 100% Gen: Well NAD HEENT: EOMI,  MMM Right great toe: Medial toenail slightly ingrown. Slight surrounding skin erythema. Small amount of clear material expressed from the cuticle with palpation. Not very tender. Left foot: Short foot with high arch at rest. Loss of transverse arch. Bunion formation bilaterally.  Tender palpation second and third metatarsal head. Capillary refill sensation intact distally bilateral  No results found for this or any previous visit (from the past 24 hour(s)). No results found.  Assessment and Plan: 67 y.o. female with  1) early right great ingrown toenail. Patient has an appointment with podiatrist next week. Plan his antibiotics. Patient is allergic to multiple antibiotics including clindamycin, doxycycline, and sulfa. We'll use Keflex. Followup with podiatry ASAP.  2) left metatarsalgia: Reassured patient. Followup with podiatry next week.  Discussed warning signs or symptoms. Please see discharge instructions. Patient expresses understanding.      Rodolph Bong, MD 11/06/12 [phone]

## 2012-11-06 NOTE — Telephone Encounter (Signed)
Called pt and advised her to go to the urgent care. Pt did not want to do this at first. Pt stated she tried to get in with the podiatrist and the recommended she be seen by Dr Elvera Lennox first b/c of her diabetes. Convinced pt to go to the urgent care. Pt to call when she is seen to let us know what the physician says. Advised pt to go to the [facility] on [facility]. Be advised.

## 2012-11-06 NOTE — Telephone Encounter (Signed)
Carollee Herter, can you please tell her to go to the urgent care this afternoon for it if possible? Thank you!

## 2012-11-06 NOTE — Telephone Encounter (Signed)
Pt called stating her R great toe looks bad. She has tried to "take care of it," but she believes Dr Elvera Lennox needs to see it. She stated it is purple in color, has a black line running through it and has fluid coming from the corner of the nail. Be advised, I scheduled pt to be seen Thurs afternoon.

## 2012-11-06 NOTE — Telephone Encounter (Signed)
Spoke with patient. She will call for follow up with Dr. Clifton James

## 2012-11-06 NOTE — ED Notes (Signed)
Pt c/o poss ingrown toe nail on right greater toe onset 1 week No pain associated or fevers Sxs include: drainage from nail Ambulated well to exam room w/steady gait... Alert w/no signs of acute distress

## 2012-11-08 ENCOUNTER — Encounter: Payer: Self-pay | Admitting: Cardiology

## 2012-11-08 ENCOUNTER — Ambulatory Visit: Payer: Medicare Other | Admitting: Internal Medicine

## 2012-11-08 NOTE — Telephone Encounter (Deleted)
Encounter open in error 

## 2012-11-08 NOTE — Telephone Encounter (Signed)
Opened in error

## 2012-11-09 DIAGNOSIS — G4730 Sleep apnea, unspecified: Secondary | ICD-10-CM

## 2012-11-09 DIAGNOSIS — G4710 Hypersomnia, unspecified: Secondary | ICD-10-CM

## 2012-11-09 NOTE — Procedures (Signed)
NAME:  LEEANA, CREER NO.:  [id number]  MEDICAL RECORD NO.:  [id number]          PATIENT TYPE:  OUT  LOCATION:  SLEEP CENTER                 FACILITY:  [facility]  PHYSICIAN:  Barbaraann Share, MD,FCCPDATE OF BIRTH:  12-[birth year]1947  DATE OF STUDY:  11/02/2012                           NOCTURNAL POLYSOMNOGRAM  REFERRING PHYSICIAN:  Duke Salvia, MD, [facility]  LOCATION:  Sleep lab.  INDICATION FOR STUDY:  Hypersomnia with sleep apnea.  EPWORTH SLEEPINESS SCORE:  8.  MEDICATIONS:  SLEEP ARCHITECTURE:  The patient had a total sleep time of 247 minutes with no slow wave sleep and only 53 minutes of REM.  Sleep onset latency was at the upper limits of normal at 33 minutes, and REM onset was normal at 115 minutes.  Sleep efficiency was poor at 62%.  RESPIRATORY DATA:  The patient was found to have no obstructive apneas and 7 obstructive hypopneas, giving her an apnea-hypopnea index of only 2 events per hour.  The events occurred in all body positions, and there was moderate snoring noted throughout.  OXYGEN DATA:  There was transient O2 desaturation as low as 89% with the patient's obstructive events.  CARDIAC DATA:  No clinically significant arrhythmias were noted.  MOVEMENT-PARASOMNIA:  The patient did not have any significant leg movements or abnormal behavior.  IMPRESSION-RECOMMENDATION:  Small numbers of obstructive events, which do not meet the AHI criteria for the obstructive sleep apnea syndrome. The patient did have moderate snoring and should be encouraged to work aggressively on weight loss.     Barbaraann Share, MD,FCCP Diplomate, American Board of Sleep Medicine   [facility]  D:  11/09/2012 08:18:25  T:  11/09/2012 13:02:34  Job:  [id number]

## 2012-11-10 ENCOUNTER — Other Ambulatory Visit: Payer: Self-pay | Admitting: Internal Medicine

## 2012-11-12 ENCOUNTER — Encounter: Payer: Self-pay | Admitting: Pulmonary Disease

## 2012-11-12 ENCOUNTER — Telehealth: Payer: Self-pay | Admitting: Internal Medicine

## 2012-11-12 ENCOUNTER — Encounter: Payer: Self-pay | Admitting: Internal Medicine

## 2012-11-12 ENCOUNTER — Encounter (HOSPITAL_COMMUNITY): Payer: Medicare Other

## 2012-11-12 ENCOUNTER — Ambulatory Visit (INDEPENDENT_AMBULATORY_CARE_PROVIDER_SITE_OTHER): Payer: Medicare Other | Admitting: Internal Medicine

## 2012-11-12 VITALS — BP 110/70 | HR 72 | Temp 98.2°F | Resp 16 | Ht 62.0 in | Wt 224.0 lb

## 2012-11-12 DIAGNOSIS — N289 Disorder of kidney and ureter, unspecified: Secondary | ICD-10-CM

## 2012-11-12 DIAGNOSIS — E1165 Type 2 diabetes mellitus with hyperglycemia: Secondary | ICD-10-CM

## 2012-11-12 DIAGNOSIS — L03039 Cellulitis of unspecified toe: Secondary | ICD-10-CM

## 2012-11-12 DIAGNOSIS — Z23 Encounter for immunization: Secondary | ICD-10-CM

## 2012-11-12 DIAGNOSIS — L03032 Cellulitis of left toe: Secondary | ICD-10-CM

## 2012-11-12 MED ORDER — MUPIROCIN 2 % EX OINT: TOPICAL_OINTMENT | Freq: Three times a day (TID) | CUTANEOUS | Status: DC

## 2012-11-12 NOTE — Telephone Encounter (Signed)
Ok with [name]- suggested she call card and ask if they will draw

## 2012-11-12 NOTE — Patient Instructions (Signed)
The patient is instructed to continue all medications as prescribed. Schedule followup with check out clerk upon leaving the clinic  

## 2012-11-12 NOTE — Telephone Encounter (Signed)
Pt forgot to have labs done this am. Will be going [facility]p [facility] on wed. Is it ok to have those labs done there? I see they are in computer already, is that ok?

## 2012-11-12 NOTE — Progress Notes (Signed)
Subjective:    Patient ID: Kristen [name], female    DOB: [birth year], 67 y.o.   MRN: [id number]  HPI  Medication changes noted from Dr Adele Barthel the carvedolol and changed to the tenormin ( this was a transition from the toprol) Due to CHF HTN stable DM management Low dose dig noted Increased anxiety over testing   Review of Systems  Constitutional: Positive for fatigue. Negative for activity change and appetite change.  HENT: Negative for ear pain, congestion, neck pain, postnasal drip and sinus pressure.   Eyes: Negative for redness and visual disturbance.  Respiratory: Positive for shortness of breath. Negative for cough and wheezing.   Gastrointestinal: Negative for abdominal pain and abdominal distention.  Genitourinary: Negative for dysuria, frequency and menstrual problem.  Musculoskeletal: Negative for myalgias, joint swelling and arthralgias.  Skin: Negative for rash and wound.  Neurological: Positive for tremors and weakness. Negative for dizziness and headaches.  Hematological: Negative for adenopathy. Does not bruise/bleed easily.  Psychiatric/Behavioral: Negative for sleep disturbance and decreased concentration.   Past Medical History  Diagnosis Date  . CAD (coronary artery disease)   . PAF (paroxysmal atrial fibrillation)   . Hypertension   . Hyperlipidemia   . Hypokalemia   . Diabetes mellitus   . Depression   . Lumbar back pain   . LBBB (left bundle branch block)   . Headache(784.0)   . Lymphadenitis   . Arthritis   . Bursitis   . Chronic anticoagulation     on coumadin  . Heart palpitations   . Diastolic dysfunction     per echo in October 2012 with EF 50 to 55%  . 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 GERD (gastroesophageal reflux disease) 10/23/2003  . Fibromyalgia   . Blood transfusion without reported diagnosis     History   Social History  . Marital Status: Legally Separated    Spouse Name: N/A    Number of Children:  N/A  . Years of Education: N/A   Occupational History  . Not on file.   Social History Main Topics  . Smoking status: Former Smoker    Quit date: 03/07/2001  . Smokeless tobacco: Never Used  . Alcohol Use: No  . Drug Use: No  . Sexual Activity: Not on file   Other Topics Concern  . Not on file   Social History Narrative  . No narrative on file    Past Surgical History  Procedure Laterality Date  . Angioplasty  laminectomy  . Lumbar laminectomy      x3  . Coronary artery bypass graft      LIMA to LAD   . Abdominal hysterectomy    . Cholecystectomy    . Tonsillectomy    . Knee arthroscopy    . Sphincterotomy      Family History  Problem Relation Age of Onset  . Heart attack Father   . Heart disease Father   . Kidney disease    . Stroke    . Arthritis    . Hypertension    . Diabetes    . Stroke Mother   . Colon cancer Paternal Uncle     Allergies  Allergen Reactions  . Cleocin [Clindamycin Hcl] Diarrhea  . Codeine Itching  . Doxycycline Hyclate Diarrhea  . Morphine Hives  . Pentazocine Lactate Itching and Nausea And Vomiting  . Vibramycin [Doxycycline Calcium] Diarrhea  . Sulfonamide Derivatives Rash    Current Outpatient Prescriptions on File  Prior to Visit  Medication Sig Dispense Refill  . ALPRAZolam (XANAX) 1 MG tablet TAKE 1 TABLET 3 TIMES A DAY AS NEEDED  90 tablet  3  . Alum & Mag Hydroxide-Simeth (MAGIC MOUTHWASH W/LIDOCAINE) SOLN Take 5 mLs by mouth 3 (three) times daily as needed.      Marland Kitchen atorvastatin (LIPITOR) 20 MG tablet TAKE 2 TABLETS BY MOUTH DAILY  60 tablet  11  . cephALEXin (KEFLEX) 500 MG capsule Take 1 capsule (500 mg total) by mouth 4 (four) times daily.  28 capsule  0  . clotrimazole-betamethasone (LOTRISONE) cream Apply topically 2 (two) times daily.  45 g  0  . cyclobenzaprine (FLEXERIL) 10 MG tablet Take 10 mg by mouth 2 (two) times daily as needed. For muscle spasms      . furosemide (LASIX) 20 MG tablet Take 1 tablet (20 mg  total) by mouth daily.  30 tablet  3  . gabapentin (NEURONTIN) 300 MG capsule TAKE 1 CAPSULE THREE TIMES A DAY  90 capsule  2  . HYDROcodone-acetaminophen (NORCO) 10-325 MG per tablet TAKE 1 TABLET BY MOUTH EVERY 4 HOURS AS NEEDED FOR PAIN  150 tablet  3  . insulin detemir (LEVEMIR) 100 UNIT/ML injection Inject under skin 60 units twice a day  60 mL  12  . Insulin Pen Needle 31G X 8 MM MISC Use as directed three times a day  300 each  2  . lansoprazole (PREVACID) 30 MG capsule Take 30 mg by mouth daily as needed. For heartburn      . Levomilnacipran HCl ER (FETZIMA) 40 MG CP24 Take 1 capsule by mouth daily.  30 capsule    . metFORMIN (GLUCOPHAGE-XR) 500 MG 24 hr tablet Take 2 tablets (1,000 mg total) by mouth 2 (two) times daily with a meal.  120 tablet  3  . nitroGLYCERIN (NITROSTAT) 0.4 MG SL tablet Place 1 tablet (0.4 mg total) under the tongue every 5 (five) minutes as needed. For chest pain  30 tablet  6  . ONE TOUCH ULTRA TEST test strip USE 6 TIMES DAILY AS DIRECTED  200 each  2  . potassium chloride SA (K-DUR,KLOR-CON) 20 MEQ tablet 1 TABLET TWICE DAILY  60 tablet  6  . predniSONE (DELTASONE) 10 MG tablet       . spironolactone (ALDACTONE) 25 MG tablet TAKE 1 TABLET (25 MG TOTAL) BY MOUTH 2 (TWO) TIMES DAILY.  60 tablet  6  . telmisartan (MICARDIS) 40 MG tablet Take 1 tablet (40 mg total) by mouth daily.  90 tablet  3  . warfarin (COUMADIN) 6 MG tablet Take 3-6 mg by mouth daily. 6mg  on mondays 3 mg all other days       No current facility-administered medications on file prior to visit.    BP 110/70  Pulse 72  Temp(Src) 98.2 F (36.8 C)  Resp 16  Ht 5\' 2"  (1.575 m)  Wt 224 lb (101.606 kg)  BMI 40.96 kg/m2       Objective:   Physical Exam  Constitutional: She is oriented to person, place, and time. She appears well-developed and well-nourished. No distress.  HENT:  Head: Normocephalic and atraumatic.  Eyes: Conjunctivae and EOM are normal. Pupils are equal, round, and  reactive to light.  Neck: Normal range of motion. Neck supple. No JVD present. No tracheal deviation present. No thyromegaly present.  Cardiovascular: Intact distal pulses.   Murmur heard. Pulmonary/Chest: Effort normal and breath sounds normal. She has no wheezes. She  exhibits no tenderness.  Abdominal: Soft. Bowel sounds are normal.  Musculoskeletal: Normal range of motion. She exhibits no edema and no tenderness.  Lymphadenopathy:    She has no cervical adenopathy.  Neurological: She is alert and oriented to person, place, and time. She has normal reflexes. No cranial nerve deficit.  Skin: Skin is warm and dry. She is not diaphoretic.  Psychiatric: She has a normal mood and affect. Her behavior is normal.          Assessment & Plan:  Anxiety over testing The fetzima helps Energy better with the atenolol Discussion of stress and anxeity Has lost 10 pounds since last visit Patient has a persistent paronychia at the base of the great toe He appears to be largely resolved but she has some inflammation of the cuticle Application of Bactroban 3 times a day to the site

## 2012-11-14 ENCOUNTER — Ambulatory Visit (HOSPITAL_COMMUNITY): Payer: Medicare Other | Attending: Internal Medicine | Admitting: Radiology

## 2012-11-14 ENCOUNTER — Ambulatory Visit (INDEPENDENT_AMBULATORY_CARE_PROVIDER_SITE_OTHER): Payer: Medicare Other | Admitting: *Deleted

## 2012-11-14 VITALS — BP 114/72 | HR 74 | Ht 62.0 in | Wt 224.0 lb

## 2012-11-14 DIAGNOSIS — E119 Type 2 diabetes mellitus without complications: Secondary | ICD-10-CM

## 2012-11-14 DIAGNOSIS — I10 Essential (primary) hypertension: Secondary | ICD-10-CM | POA: Insufficient documentation

## 2012-11-14 DIAGNOSIS — Z794 Long term (current) use of insulin: Secondary | ICD-10-CM | POA: Insufficient documentation

## 2012-11-14 DIAGNOSIS — Z951 Presence of aortocoronary bypass graft: Secondary | ICD-10-CM | POA: Insufficient documentation

## 2012-11-14 DIAGNOSIS — Z8673 Personal history of transient ischemic attack (TIA), and cerebral infarction without residual deficits: Secondary | ICD-10-CM | POA: Insufficient documentation

## 2012-11-14 DIAGNOSIS — R0789 Other chest pain: Secondary | ICD-10-CM

## 2012-11-14 DIAGNOSIS — I4891 Unspecified atrial fibrillation: Secondary | ICD-10-CM

## 2012-11-14 DIAGNOSIS — R0602 Shortness of breath: Secondary | ICD-10-CM

## 2012-11-14 DIAGNOSIS — R5381 Other malaise: Secondary | ICD-10-CM | POA: Insufficient documentation

## 2012-11-14 DIAGNOSIS — I2510 Atherosclerotic heart disease of native coronary artery without angina pectoris: Secondary | ICD-10-CM

## 2012-11-14 DIAGNOSIS — Z87891 Personal history of nicotine dependence: Secondary | ICD-10-CM | POA: Insufficient documentation

## 2012-11-14 DIAGNOSIS — R0609 Other forms of dyspnea: Secondary | ICD-10-CM | POA: Insufficient documentation

## 2012-11-14 DIAGNOSIS — R079 Chest pain, unspecified: Secondary | ICD-10-CM | POA: Insufficient documentation

## 2012-11-14 DIAGNOSIS — R55 Syncope and collapse: Secondary | ICD-10-CM | POA: Insufficient documentation

## 2012-11-14 DIAGNOSIS — R002 Palpitations: Secondary | ICD-10-CM | POA: Insufficient documentation

## 2012-11-14 DIAGNOSIS — I447 Left bundle-branch block, unspecified: Secondary | ICD-10-CM | POA: Insufficient documentation

## 2012-11-14 DIAGNOSIS — R42 Dizziness and giddiness: Secondary | ICD-10-CM | POA: Insufficient documentation

## 2012-11-14 DIAGNOSIS — Z8249 Family history of ischemic heart disease and other diseases of the circulatory system: Secondary | ICD-10-CM | POA: Insufficient documentation

## 2012-11-14 DIAGNOSIS — R0989 Other specified symptoms and signs involving the circulatory and respiratory systems: Secondary | ICD-10-CM | POA: Insufficient documentation

## 2012-11-14 LAB — BASIC METABOLIC PANEL
CO2: 23 mEq/L (ref 19–32)
Chloride: 106 mEq/L (ref 96–112)
Potassium: 3.8 mEq/L (ref 3.5–5.1)
Sodium: 138 mEq/L (ref 135–145)

## 2012-11-14 LAB — HEMOGLOBIN A1C: Hgb A1c MFr Bld: 7.2 % — ABNORMAL HIGH (ref 4.6–6.5)

## 2012-11-14 MED ORDER — TECHNETIUM TC 99M SESTAMIBI GENERIC - CARDIOLITE
33.0000 | Freq: Once | INTRAVENOUS | Status: AC | PRN
  Administered 2012-11-14: 33 via INTRAVENOUS

## 2012-11-14 MED ORDER — TECHNETIUM TC 99M SESTAMIBI GENERIC - CARDIOLITE
11.0000 | Freq: Once | INTRAVENOUS | Status: AC | PRN
  Administered 2012-11-14: 11 via INTRAVENOUS

## 2012-11-14 MED ORDER — ADENOSINE (DIAGNOSTIC) 3 MG/ML IV SOLN
0.5600 mg/kg | Freq: Once | INTRAVENOUS | Status: AC
  Administered 2012-11-14: 57 mg via INTRAVENOUS

## 2012-11-14 NOTE — Progress Notes (Signed)
[facility] SITE 3 NUCLEAR MED [address] [place], [place] [address] [phone]    Cardiology Nuclear Med Study  Kristen [name] is a 67 y.o. female     MRM : [id number]     DOB: 05/23/1945  Procedure Date: 11/14/2012  Nuclear Med Background Indication for Stress Test:  Evaluation for Ischemia and Graft Patency History: '04 CABG, '07 MPS:no ischemia, EF=64%; '10 Cath:patent grafts; '12 Echo:EF=55%,  mild LVH; h/o NSVT Cardiac Risk Factors: CVA, Family History - CAD, History of Smoking, Hypertension, IDDM Type 2, LBBB and Lipids  Symptoms:  Chest Pain (last episode of chest discomfort was about 3-4 weeks ago), Dizziness, DOE, Fatigue, Light-Headedness, Near Syncope, Palpitations and Rapid HR   Nuclear Pre-Procedure Caffeine/Decaff Intake:  None > 12 hrs NPO After: 12:00 am   Lungs:  Clear. O2 Sat: 97% on room air. IV 0.9% NS with Angio Cath:  24g  IV Site: R Antecubital x 1, tolerated well IV Started by:  Irean Hong, RN  Chest Size (in):  44 Cup Size: B  Height: 5\' 2"  (1.575 m)  Weight:  224 lb (101.606 kg)  BMI:  Body mass index is 40.96 kg/(m^2). Tech Comments:  No insulin or metformin today; full dose insulin 6:00 pm yesterday. Fasting CBG was 148 at 0700 today.    Nuclear Med Study 1 or 2 day study: 1 day  Stress Test Type:  Adenosine  Reading MD: Tobias Alexander, MD  Order Authorizing Provider:  Sherryl Manges, MD  Resting Radionuclide: Technetium 53m Sestamibi  Resting Radionuclide Dose: 11.0 mCi   Stress Radionuclide:  Technetium 41m Sestamibi  Stress Radionuclide Dose: 33.0 mCi           Stress Protocol Rest HR: 74 Stress HR: 106  Rest BP: 114/72 Stress BP: 106  Exercise Time (min): n/a METS: n/a   Predicted Max HR: 156 bpm % Max HR: 67.95 bpm Rate Pressure Product: 53664   Dose of Adenosine (mg):  57.0 Dose of Lexiscan: n/a mg  Dose of Atropine (mg): n/a Dose of Dobutamine: n/a mcg/kg/min (at max HR)  Stress Test Technologist: Smiley Houseman, CMA-N  Nuclear Technologist:  Domenic Polite, CNMT     Rest Procedure:  Myocardial perfusion imaging was performed at rest 45 minutes following the intravenous administration of Technetium 46m Sestamibi.  Rest ECG: NSR-LBBB  Stress Procedure:  The patient received IV adenosine at 140 mcg/kg/min for 4 minutes.  She c/o chest and thigh tightness, shortness of breath and headache with infusion.  Technetium 63m Sestamibi was injected at the 2 minute mark and quantitative spect images were obtained after a 45 minute delay.  Stress ECG: No significant change from baseline ECG  QPS Raw Data Images:  Patient motion noted. Stress Images:  There is a stress perfusion defect in the apical and mid inferior wall and apical septal wall.  Rest Images:  Normal homogeneous uptake in all areas of the myocardium. Subtraction (SDS):  These findings are consistent with ischemia. Transient Ischemic Dilatation (Normal <1.22):  n/a Lung/Heart Ratio (Normal <0.45):  0.33  Quantitative Gated Spect Images QGS EDV:  98 ml QGS ESV:  52 ml  Impression Exercise Capacity:  Adenosine study with no exercise. BP Response:  Normal blood pressure response. Clinical Symptoms:  Mild chest pain/dyspnea. ECG Impression:  No significant ST segment change suggestive of ischemia. Comparison with Prior Nuclear Study: No images to compare  Overall Impression:  Intermediate risk stress nuclear study with medium size moderate severity reversible defect  in the RCA territory. .  LV Ejection Fraction: 47%.  LV Wall Motion:  There  is a paradoxical septal motion, hypokinesis of the basal and mid inferior and inferoseptal walls.   Tobias Alexander, MD 11/14/2012

## 2012-11-16 ENCOUNTER — Telehealth: Payer: Self-pay | Admitting: Internal Medicine

## 2012-11-16 ENCOUNTER — Ambulatory Visit (INDEPENDENT_AMBULATORY_CARE_PROVIDER_SITE_OTHER): Payer: Medicare Other | Admitting: General Practice

## 2012-11-16 DIAGNOSIS — I4891 Unspecified atrial fibrillation: Secondary | ICD-10-CM

## 2012-11-16 DIAGNOSIS — Z7901 Long term (current) use of anticoagulants: Secondary | ICD-10-CM

## 2012-11-16 LAB — POCT INR: INR: 2

## 2012-11-16 NOTE — Telephone Encounter (Signed)
Need the results of sleep study and stress test.

## 2012-11-17 ENCOUNTER — Encounter: Payer: Self-pay | Admitting: Internal Medicine

## 2012-11-20 NOTE — Telephone Encounter (Signed)
Follow up:   Pt would like to get the results of her Nuc study and her sleep study.

## 2012-11-21 NOTE — Telephone Encounter (Signed)
Left message that study results have not been viewed by Dr. Graciela Husbands yet, but that we would be in touch as soon as he is able to view them

## 2012-11-26 ENCOUNTER — Telehealth: Payer: Self-pay | Admitting: Internal Medicine

## 2012-11-26 ENCOUNTER — Telehealth: Payer: Self-pay | Admitting: *Deleted

## 2012-11-26 NOTE — Telephone Encounter (Signed)
[employer] Patient Assistance Program and re-ordered/re-filled pt's Levemir, 60 units twice daily; per Dr Elvera Lennox. Called [phone]. Rep stated pt's medication will arrive at our office (Endocrinology) within 7-10 business days.

## 2012-11-26 NOTE — Telephone Encounter (Signed)
PT would like clarification on her meds. Pt states she believes she is taking it incorrectly or that it was prescribed incorrectly. Pt would like to be advised.

## 2012-11-26 NOTE — Telephone Encounter (Signed)
I spoke with the pt and she needs clarification on her Atenolol dosage. The pt did understand the instructions that were given to her about the 3 beta blocker prescriptions. The pt first tried Atenolol and her fatigue has improved.  The pt said she needs to have her Atenolol dosage clarified. The Rx that was given to the pt on 10/24/12 was for Atenolol 100mg  take one by mouth twice a day but the pt instructions on office note say to take one daily.  The pt has been taking Atenolol 100mg  twice a day and her pulse had been running 55-70. The pt's BP has also been okay and her fatigue has improved. The pt will need a new Rx sent to the pharmacy with correct instructions.   The pt is also wondering about the results of her sleep study and myoview. I made the pt aware that Dr Graciela Husbands will need to review these results and the pt would like a call back on 11/27/12.

## 2012-11-27 ENCOUNTER — Other Ambulatory Visit: Payer: Self-pay

## 2012-11-27 MED ORDER — ATENOLOL 100 MG PO TABS: 100.0000 mg | ORAL_TABLET | Freq: Every day | ORAL | Status: DC

## 2012-11-27 NOTE — Telephone Encounter (Signed)
Patient is aware to take Atenolol once daily per Dr. Graciela Husbands

## 2012-11-28 ENCOUNTER — Telehealth: Payer: Self-pay | Admitting: Cardiovascular Disease

## 2012-11-28 ENCOUNTER — Telehealth: Payer: Self-pay | Admitting: Internal Medicine

## 2012-11-28 NOTE — Telephone Encounter (Deleted)
Error

## 2012-11-28 NOTE — Telephone Encounter (Signed)
Patient Information:  Caller Name: Oniyah  Phone: [phone]  Patient: Kristen [name]  Gender: Female  DOB: 1946/02/22  Age: 67 Years  PCP: Darryll Capers (Adults only)  Office Follow Up:  Does the office need to follow up with this patient?: Yes  Instructions For The Office: Pt requests a call back regarding her insomnia and how she may treat it.  RN Note:  Pt requests a call back regarding her insomnia and how she may treat it.  Symptoms  Reason For Call & Symptoms: Pt calling regarding insomnia related. Pt states she has many health issues and stress. Falls asleep fine but can not stay asleep. Wakes up several times a night every hour to 1 1/2  and then has to just get up around 0400. Pt had a sleep study on 11/02/12 but states no one ever dicussed the findings with her.  Reviewed Health History In EMR: Yes  Reviewed Medications In EMR: Yes  Reviewed Allergies In EMR: Yes  Reviewed Surgeries / Procedures: Yes  Date of Onset of Symptoms: 11/14/2012  Guideline(s) Used:  Insomnia  Disposition Per Guideline:   See Within 3 Days in Office  Reason For Disposition Reached:   Insomnia persists > 1 week and following Insomnia Care Advice  Advice Given:  N/A  Patient Will Follow Care Advice:  YES

## 2012-11-28 NOTE — Telephone Encounter (Signed)
Pt called back.  Gabby to make 30 minute appt with PC per Melchor Amour, LPN.

## 2012-11-28 NOTE — Telephone Encounter (Signed)
Left messsage on mobile for the pt to return my call.

## 2012-11-29 ENCOUNTER — Encounter: Payer: Self-pay | Admitting: Internal Medicine

## 2012-11-29 ENCOUNTER — Telehealth: Payer: Self-pay | Admitting: *Deleted

## 2012-11-29 NOTE — Telephone Encounter (Signed)
Called pt and let her know that her medication form [employer]/Patient Assistance Program (levemir flex touch pens) is here for her to pick up (10 boxes). Pt stated she has an appt tomorrow and will pick it up then.

## 2012-11-30 ENCOUNTER — Encounter: Payer: Self-pay | Admitting: *Deleted

## 2012-11-30 ENCOUNTER — Encounter: Payer: Self-pay | Admitting: Internal Medicine

## 2012-11-30 ENCOUNTER — Ambulatory Visit (INDEPENDENT_AMBULATORY_CARE_PROVIDER_SITE_OTHER): Payer: Medicare Other | Admitting: Internal Medicine

## 2012-11-30 ENCOUNTER — Other Ambulatory Visit: Payer: Self-pay | Admitting: *Deleted

## 2012-11-30 VITALS — BP 112/68 | HR 84 | Temp 98.1°F | Resp 12 | Wt 222.0 lb

## 2012-11-30 DIAGNOSIS — I25810 Atherosclerosis of coronary artery bypass graft(s) without angina pectoris: Secondary | ICD-10-CM

## 2012-11-30 DIAGNOSIS — I798 Other disorders of arteries, arterioles and capillaries in diseases classified elsewhere: Secondary | ICD-10-CM

## 2012-11-30 DIAGNOSIS — E1159 Type 2 diabetes mellitus with other circulatory complications: Secondary | ICD-10-CM

## 2012-11-30 DIAGNOSIS — E1151 Type 2 diabetes mellitus with diabetic peripheral angiopathy without gangrene: Secondary | ICD-10-CM

## 2012-11-30 NOTE — Progress Notes (Signed)
Patient ID: Kristen [name], female   DOB: 08/09/1945, 67 y.o.   MRN: [id number]  HPI: Kristen [name] is a 67 y.o.-year-old female, returning for DM2, dx 2008, insulin-dependent, uncontrolled, with complications (CAD, PAD, h/o CVD, peripheral neuropathy). Last visit 3 mo ago.   She had a stress test on 11/14/2012 b/c palpitations and tachycardia (on Holter monitor) >> will have a cath on 11/30/2012.   Last hemoglobin A1c was: Lab Results  Component Value Date   HGBA1C 7.2* 11/14/2012    Pt is on a regimen of: - Levemir pen 60 units bid - she is not affording Levemir and gets samples from PCP, but this will be solved soon as she gets the med directly from [employer] - Metformin XR 1000 mg bid - tolerates it well She was on Bydureon in the past, then also on Novolog 60-70-75.   Pt checks her sugars 2x a day and they are: - am: 90-115 >> 89-131 >> 90-100 - before lunch: 120-140 >> 82-125 >> 79 (once) - depends on what she eat for b'fast; sometimes higher - before dinner: 99-168 >> not checking - after dinner: 120-130 - bedtime: 110-120 >> 91-123, once 211 >> 90-100 Had some lows in the 70s;  she has hypoglycemia awareness at 100. Highest sugar was 200 x2 after bread.  She changed her diet, stopped eating breads, eating less sweets.  Pt does not have chronic kidney disease, last BUN/creatinine was:  Lab Results  Component Value Date   BUN 13 11/14/2012   CREATININE 0.7 11/14/2012   Last set of lipids: Lab Results  Component Value Date   CHOL 187 02/28/2011   HDL 35.80* 02/28/2011   LDLCALC 118* 02/28/2011   LDLDIRECT 116.0 11/28/2008   TRIG 165.0* 02/28/2011   CHOLHDL 5 02/28/2011   Pt's last eye exam was in last year. Reportingly no DR.  Denies numbness and tingling in her legs.  I reviewed her chart and she also has a history of hypertension, paroxysmal A. Fib-on anticoagulation, depression, osteoarthritis - status post arthroscopic knee surgeries x2, status post  lumbar laminectomy x3, hyperlipidemia, morbid obesity, chronic lymphadenitis, GERD, hypothyroidism, headaches, fibromyalgia.  ROS: Constitutional: fatigue, feeling hot, hot flashes, poor sleep Eyes: + blurry vision, no xerophthalmia ENT: no sore throat, no nodules palpated in throat, no dysphagia/odynophagia, no hoarseness Cardiovascular: +CP/+ palpitations/no leg swelling Respiratory: no cough/SOB Gastrointestinal: no vomiting diarrhea or constipation, but has nausea Musculoskeletal: + muscle aches/+ joint aches Skin: no rashes Neurological: no tremors/numbness/tingling/dizziness, + headaches Psychiatric: no depression/anxiety  I reviewed pt's medications, allergies, PMH, social hx, family hx and no changes required, except as mentioned above, and stopping metoprolol, digoxin, aspirin, Keflex, and starting atenolol 100 mg daily. PE: BP 112/68  Pulse 84  Temp(Src) 98.1 F (36.7 C) (Oral)  Resp 12  Wt 222 lb (100.699 kg)  BMI 40.59 kg/m2  SpO2 96% Wt Readings from Last 3 Encounters:  11/30/12 222 lb (100.699 kg)  11/14/12 224 lb (101.606 kg)  11/12/12 224 lb (101.606 kg)   Constitutional: overweight, in NAD, full supraclavicular fat pads Eyes: PERRLA, EOMI, no exophthalmos ENT: moist mucous membranes, no thyromegaly, no cervical lymphadenopathy Cardiovascular: RRR, No MRG Respiratory: CTA B Gastrointestinal: abdomen soft, NT, ND, BS+ Musculoskeletal: no deformities, strength intact in all 4 Skin: moist, warm, acanthosis nigricans; + weeping wound on the right lateral periungual space, after having an ingrown toenail caught. No purulence, however the area is moist and appears inflamed and infiltrate adjacent tissue. Neurological: no tremor with outstretched hands, DTR  normal in all 4  ASSESSMENT: 1. DM2, insulin-dependent, uncontrolled, with complications - CAD- Echo 12/2010: EF 50-55%, status post CABG 2004: LIMA to LAD - cards: Dr. Riley Kill. Also has a LBBB.  - PAD - h/o  stroke - peripheral neuropathy - on neurontin  2. Toe wound - advised her to call and see her podiatrist about it. It does not look infected, but it is moist and does not appear to be healing. The  PLAN:  1. Patient with a six-year history of diabetes, on high-dose of basal insulin, which we are titrating down. She did very well with decreasing her Levemir dose and adding Metformin. Sugars are almost all at goal. - continue Metformin XR 1000 mg twice a day.  - I would also decrease the dose of her Levemir from 60 units twice a day to 50 units twice a day. I hope we can titrate down even more in the future. - I advised her to start writing down pre and post meal sugars  - check HbA1C at next visit - RTC in 3 mo

## 2012-11-30 NOTE — Patient Instructions (Signed)
Please decrease the Levemir to 50 units 2x a day. Continue Metformin XR 100 mg 2x a day.  Please return in 3 months with your sugar log.

## 2012-12-01 DIAGNOSIS — E1151 Type 2 diabetes mellitus with diabetic peripheral angiopathy without gangrene: Secondary | ICD-10-CM | POA: Insufficient documentation

## 2012-12-03 ENCOUNTER — Ambulatory Visit: Payer: Medicare Other | Admitting: Family

## 2012-12-03 ENCOUNTER — Encounter (HOSPITAL_BASED_OUTPATIENT_CLINIC_OR_DEPARTMENT_OTHER): Payer: Medicare Other

## 2012-12-03 ENCOUNTER — Ambulatory Visit (INDEPENDENT_AMBULATORY_CARE_PROVIDER_SITE_OTHER): Payer: Medicare Other | Admitting: Pharmacist

## 2012-12-03 DIAGNOSIS — Z7901 Long term (current) use of anticoagulants: Secondary | ICD-10-CM

## 2012-12-03 DIAGNOSIS — I25810 Atherosclerosis of coronary artery bypass graft(s) without angina pectoris: Secondary | ICD-10-CM

## 2012-12-03 DIAGNOSIS — I4891 Unspecified atrial fibrillation: Secondary | ICD-10-CM

## 2012-12-03 LAB — BASIC METABOLIC PANEL
CO2: 30 mEq/L (ref 19–32)
Chloride: 105 mEq/L (ref 96–112)
Potassium: 3.8 mEq/L (ref 3.5–5.1)
Sodium: 140 mEq/L (ref 135–145)

## 2012-12-03 LAB — CBC WITH DIFFERENTIAL/PLATELET
Basophils Relative: 0.4 % (ref 0.0–3.0)
Eosinophils Absolute: 0.2 10*3/uL (ref 0.0–0.7)
Eosinophils Relative: 2.5 % (ref 0.0–5.0)
Hemoglobin: 13.6 g/dL (ref 12.0–15.0)
MCHC: 33.2 g/dL (ref 30.0–36.0)
MCV: 93.3 fl (ref 78.0–100.0)
Monocytes Absolute: 0.5 10*3/uL (ref 0.1–1.0)
Neutro Abs: 4 10*3/uL (ref 1.4–7.7)
Neutrophils Relative %: 51.3 % (ref 43.0–77.0)
RBC: 4.37 Mil/uL (ref 3.87–5.11)
WBC: 7.7 10*3/uL (ref 4.5–10.5)

## 2012-12-03 LAB — POCT INR: INR: 1.8

## 2012-12-04 ENCOUNTER — Encounter (HOSPITAL_COMMUNITY): Payer: Self-pay | Admitting: Pharmacy Technician

## 2012-12-05 ENCOUNTER — Telehealth: Payer: Self-pay | Admitting: Internal Medicine

## 2012-12-05 ENCOUNTER — Other Ambulatory Visit: Payer: Self-pay | Admitting: Internal Medicine

## 2012-12-05 ENCOUNTER — Encounter: Payer: Self-pay | Admitting: Family

## 2012-12-05 ENCOUNTER — Ambulatory Visit (INDEPENDENT_AMBULATORY_CARE_PROVIDER_SITE_OTHER): Payer: Medicare Other | Admitting: Family

## 2012-12-05 VITALS — BP 118/64 | HR 71 | Wt 224.0 lb

## 2012-12-05 DIAGNOSIS — G4700 Insomnia, unspecified: Secondary | ICD-10-CM

## 2012-12-05 DIAGNOSIS — G4733 Obstructive sleep apnea (adult) (pediatric): Secondary | ICD-10-CM

## 2012-12-05 NOTE — Patient Instructions (Signed)

## 2012-12-05 NOTE — Telephone Encounter (Signed)
New Problem:  Pt states she had a sleep study done recently. Pt would like to get those results. Pt also states she has concerns about her insomnia. Pt would like to be advised.

## 2012-12-05 NOTE — Progress Notes (Signed)
Subjective:    Patient ID: Kristen [name], female    DOB: [birth year], 67 y.o.   MRN: [id number]  HPI 67 year old African American female, nonsmoker does have a complaint of insomnia. She reports she had a sleep study performed but has not heard the results of the study yet. She takes Xanax 1 mg 3 times a day and antidepressant. She reports being more anxious and stressed about an upcoming cardiac catheterization that she will have in 2 days. Concerned about not waking up. She denies any chest pain, shortness of breath, lightheadedness or dizziness.   Review of Systems  Constitutional: Negative.   HENT: Negative.   Respiratory: Negative.   Cardiovascular: Negative.   Gastrointestinal: Negative.   Endocrine: Negative.   Genitourinary: Negative.   Musculoskeletal: Negative.   Skin: Negative.   Neurological: Negative.   Psychiatric/Behavioral: Positive for sleep disturbance. The patient is nervous/anxious.    Past Medical History  Diagnosis Date  . CAD (coronary artery disease)   . PAF (paroxysmal atrial fibrillation)   . Hypertension   . Hyperlipidemia   . Hypokalemia   . Diabetes mellitus   . Depression   . Lumbar back pain   . LBBB (left bundle branch block)   . Headache(784.0)   . Lymphadenitis   . Arthritis   . Bursitis   . Chronic anticoagulation     on coumadin  . Heart palpitations   . Diastolic dysfunction     per echo in October 2012 with EF 50 to 55%  . 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 GERD (gastroesophageal reflux disease) 10/23/2003  . Fibromyalgia   . Blood transfusion without reported diagnosis     History   Social History  . Marital Status: Legally Separated    Spouse Name: N/A    Number of Children: N/A  . Years of Education: N/A   Occupational History  . Not on file.   Social History Main Topics  . Smoking status: Former Smoker    Quit date: 03/07/2001  . Smokeless tobacco: Never Used  . Alcohol Use: No  . Drug Use: No  .  Sexual Activity: Not on file   Other Topics Concern  . Not on file   Social History Narrative  . No narrative on file    Past Surgical History  Procedure Laterality Date  . Angioplasty  laminectomy  . Lumbar laminectomy      x3  . Coronary artery bypass graft      LIMA to LAD   . Abdominal hysterectomy    . Cholecystectomy    . Tonsillectomy    . Knee arthroscopy    . Sphincterotomy      Family History  Problem Relation Age of Onset  . Heart attack Father   . Heart disease Father   . Kidney disease    . Stroke    . Arthritis    . Hypertension    . Diabetes    . Stroke Mother   . Colon cancer Paternal Uncle     Allergies  Allergen Reactions  . Cleocin [Clindamycin Hcl] Diarrhea  . Codeine Itching  . Doxycycline Hyclate Diarrhea  . Macrolides And Ketolides Diarrhea  . Metformin And Related Other (See Comments)    Must take XR form only, cannot tolerate Regular release metformin  . Morphine Hives  . Pentazocine Lactate Itching and Nausea And Vomiting  . Vibramycin [Doxycycline Calcium] Diarrhea and Rash  . Sulfonamide Derivatives Rash  Current Outpatient Prescriptions on File Prior to Visit  Medication Sig Dispense Refill  . ALPRAZolam (XANAX) 1 MG tablet Take 1 mg by mouth 3 (three) times daily.      Marland Kitchen atenolol (TENORMIN) 100 MG tablet Take 1 tablet (100 mg total) by mouth daily.  90 tablet  3  . atorvastatin (LIPITOR) 20 MG tablet Take 40 mg by mouth daily.      . clotrimazole-betamethasone (LOTRISONE) cream Apply 1 application topically daily as needed (for itching).      . cyclobenzaprine (FLEXERIL) 10 MG tablet Take 10 mg by mouth 2 (two) times daily as needed. For muscle spasms      . furosemide (LASIX) 20 MG tablet Take 1 tablet (20 mg total) by mouth daily.  30 tablet  3  . gabapentin (NEURONTIN) 300 MG capsule Take 300 mg by mouth 3 (three) times daily.      Marland Kitchen HYDROcodone-acetaminophen (NORCO) 10-325 MG per tablet Take 1 tablet by mouth every 4  (four) hours as needed for pain.      Marland Kitchen insulin detemir (LEVEMIR) 100 UNIT/ML injection Inject 50 Units into the skin 2 (two) times daily.      . lansoprazole (PREVACID) 30 MG capsule Take 30 mg by mouth daily as needed. For heartburn      . Levomilnacipran HCl ER (FETZIMA) 40 MG CP24 Take 1 capsule by mouth daily.  30 capsule    . Menthol-Methyl Salicylate (ARTHRITIS HOT) 10-15 % CREA Apply 1 application topically daily as needed (for pain).      . metFORMIN (GLUCOPHAGE-XR) 500 MG 24 hr tablet Take 1,000 mg by mouth 2 (two) times daily.      . mupirocin ointment (BACTROBAN) 2 % Apply topically 3 (three) times daily.  22 g  0  . nitroGLYCERIN (NITROSTAT) 0.4 MG SL tablet Place 1 tablet (0.4 mg total) under the tongue every 5 (five) minutes as needed. For chest pain  30 tablet  6  . potassium chloride SA (K-DUR,KLOR-CON) 20 MEQ tablet Take 20 mEq by mouth 2 (two) times daily.      Marland Kitchen spironolactone (ALDACTONE) 25 MG tablet Take 25 mg by mouth 2 (two) times daily.      Marland Kitchen telmisartan (MICARDIS) 40 MG tablet Take 1 tablet (40 mg total) by mouth daily.  90 tablet  3  . warfarin (COUMADIN) 6 MG tablet Take 3-6 mg by mouth daily. Takes 6mg  on mondays and thursdays Takes 3 mg all other days       No current facility-administered medications on file prior to visit.    BP 118/64  Pulse 71  Wt 224 lb (101.606 kg)  BMI 40.96 kg/m2  SpO2 94%chart    Objective:   Physical Exam  Constitutional: She is oriented to person, place, and time. She appears well-developed and well-nourished.  Neck: Normal range of motion. Neck supple.  Cardiovascular: Normal rate, regular rhythm and normal heart sounds.   Pulmonary/Chest: Effort normal and breath sounds normal.  Abdominal: Soft. Bowel sounds are normal.  Musculoskeletal: Normal range of motion.  Neurological: She is alert and oriented to person, place, and time.  Skin: Skin is dry.  Psychiatric: She has a normal mood and affect.          Assessment &  Plan:  Assessment: 1. Obstructive sleep apnea 2. Insomnia  Plan: I've advised patient to go back to the ordering physician to have CPAP ordered the recommendations of the sleep study physician. I believe this is a huge  portion of wine she has insomnia. Patient verbalizes understanding.

## 2012-12-06 ENCOUNTER — Other Ambulatory Visit (HOSPITAL_COMMUNITY): Payer: Self-pay | Admitting: Cardiovascular Disease

## 2012-12-06 DIAGNOSIS — I2510 Atherosclerotic heart disease of native coronary artery without angina pectoris: Secondary | ICD-10-CM

## 2012-12-07 ENCOUNTER — Encounter (HOSPITAL_COMMUNITY): Payer: Self-pay | Admitting: Physician Assistant

## 2012-12-07 ENCOUNTER — Encounter (HOSPITAL_COMMUNITY)
Admission: RE | Disposition: A | Discharge status: Home / Self Care | Payer: Self-pay | Source: Ambulatory Visit | Attending: Cardiovascular Disease

## 2012-12-07 ENCOUNTER — Ambulatory Visit (HOSPITAL_COMMUNITY)
Admission: RE | Admit: 2012-12-07 | Discharge: 2012-12-07 | Disposition: A | Discharge status: Home / Self Care | Payer: Medicare Other | Source: Ambulatory Visit | Attending: Cardiovascular Disease | Admitting: Cardiovascular Disease

## 2012-12-07 DIAGNOSIS — I152 Hypertension secondary to endocrine disorders: Secondary | ICD-10-CM | POA: Diagnosis present

## 2012-12-07 DIAGNOSIS — I2089 Other forms of angina pectoris: Secondary | ICD-10-CM | POA: Diagnosis present

## 2012-12-07 DIAGNOSIS — I519 Heart disease, unspecified: Secondary | ICD-10-CM | POA: Insufficient documentation

## 2012-12-07 DIAGNOSIS — I2510 Atherosclerotic heart disease of native coronary artery without angina pectoris: Secondary | ICD-10-CM

## 2012-12-07 DIAGNOSIS — R9439 Abnormal result of other cardiovascular function study: Secondary | ICD-10-CM | POA: Insufficient documentation

## 2012-12-07 DIAGNOSIS — E1169 Type 2 diabetes mellitus with other specified complication: Secondary | ICD-10-CM | POA: Diagnosis present

## 2012-12-07 DIAGNOSIS — E1159 Type 2 diabetes mellitus with other circulatory complications: Secondary | ICD-10-CM | POA: Diagnosis present

## 2012-12-07 DIAGNOSIS — E1151 Type 2 diabetes mellitus with diabetic peripheral angiopathy without gangrene: Secondary | ICD-10-CM | POA: Diagnosis present

## 2012-12-07 DIAGNOSIS — I208 Other forms of angina pectoris: Secondary | ICD-10-CM | POA: Diagnosis present

## 2012-12-07 HISTORY — PX: LEFT HEART CATHETERIZATION WITH CORONARY/GRAFT ANGIOGRAM: SHX5450

## 2012-12-07 LAB — LIPID PANEL
HDL: 36 mg/dL — ABNORMAL LOW (ref >39)
LDL Cholesterol: 60 mg/dL (ref 0–99)
Total CHOL/HDL Ratio: 3.3 RATIO
Triglycerides: 115 mg/dL (ref <150)
VLDL: 23 mg/dL (ref 0–40)

## 2012-12-07 SURGERY — LEFT HEART CATHETERIZATION WITH CORONARY/GRAFT ANGIOGRAM

## 2012-12-07 MED ORDER — SODIUM CHLORIDE 0.9 % IJ SOLN: 3.0000 mL | INTRAMUSCULAR | Status: DC | PRN

## 2012-12-07 MED ORDER — HEPARIN SODIUM (PORCINE) 1000 UNIT/ML IJ SOLN
INTRAMUSCULAR | Status: AC
  Filled 2012-12-07: qty 1

## 2012-12-07 MED ORDER — LIDOCAINE HCL (PF) 1 % IJ SOLN
INTRAMUSCULAR | Status: AC
  Filled 2012-12-07: qty 30

## 2012-12-07 MED ORDER — SODIUM CHLORIDE 0.9 % IV SOLN
INTRAVENOUS | Status: DC
  Administered 2012-12-07: 09:00:00 via INTRAVENOUS

## 2012-12-07 MED ORDER — NITROGLYCERIN 0.2 MG/ML ON CALL CATH LAB
INTRAVENOUS | Status: AC
  Filled 2012-12-07: qty 1

## 2012-12-07 MED ORDER — DIAZEPAM 5 MG PO TABS
5.0000 mg | ORAL_TABLET | ORAL | Status: AC
  Administered 2012-12-07: 5 mg via ORAL
  Filled 2012-12-07: qty 1

## 2012-12-07 MED ORDER — ASPIRIN 81 MG PO CHEW
CHEWABLE_TABLET | ORAL | Status: AC
  Filled 2012-12-07: qty 4

## 2012-12-07 MED ORDER — MIDAZOLAM HCL 2 MG/2ML IJ SOLN
INTRAMUSCULAR | Status: AC
  Filled 2012-12-07: qty 2

## 2012-12-07 MED ORDER — HEPARIN (PORCINE) IN NACL 2-0.9 UNIT/ML-% IJ SOLN
INTRAMUSCULAR | Status: AC
  Filled 2012-12-07: qty 1000

## 2012-12-07 MED ORDER — ASPIRIN 81 MG PO CHEW
324.0000 mg | CHEWABLE_TABLET | Freq: Once | ORAL | Status: AC
  Administered 2012-12-07: 324 mg via ORAL

## 2012-12-07 MED ORDER — FENTANYL CITRATE 0.05 MG/ML IJ SOLN
INTRAMUSCULAR | Status: AC
  Filled 2012-12-07: qty 2

## 2012-12-07 MED ORDER — VERAPAMIL HCL 2.5 MG/ML IV SOLN
INTRAVENOUS | Status: AC
  Filled 2012-12-07: qty 2

## 2012-12-07 MED ORDER — SODIUM CHLORIDE 0.9 % IJ SOLN: 3.0000 mL | Freq: Two times a day (BID) | INTRAMUSCULAR | Status: DC

## 2012-12-07 MED ORDER — SODIUM CHLORIDE 0.9 % IV SOLN
250.0000 mL | INTRAVENOUS | Status: DC | PRN
Start: 2012-12-07 — End: 2012-12-07

## 2012-12-07 NOTE — CV Procedure (Signed)
    Cardiac Catheterization Procedure Note  Name: Kristen [name] MRN: [id number] DOB: 03-Sep-1945  Procedure: Left Heart Cath, Selective Coronary Angiography, LV angiography, LIMA angiography  Indication: abnormal Myoview with moderate ischemia   Procedural Details: The left wrist was prepped, draped, and anesthetized with 1% lidocaine. Using the modified Seldinger technique, a 5 French sheath was introduced into the left radial artery. 3 mg of verapamil was administered through the sheath, weight-based unfractionated heparin was administered intravenously. Standard Judkins catheters were used for selective coronary angiography, LIMA angiography, and left ventriculography. Catheter exchanges were performed over an exchange length guidewire. There were no immediate procedural complications. A TR band was used for radial hemostasis at the completion of the procedure.  The patient was transferred to the post catheterization recovery area for further monitoring.  Procedural Findings: Hemodynamics: AO 126/71 LV 129/13  Coronary angiography: Coronary dominance: right  Left mainstem: The left mainstem is patent. There is mild irregularity without significant stenosis.  Left anterior descending (LAD): The LAD is patent throughout its course. There is mild focal 50% stenosis at the junction of the proximal and mid LAD. The diagonal branch is patent. There is no significant stenosis seen throughout the distribution of the LAD or its branches.  Left circumflex (LCx): The left circumflex is patent throughout its course. The intermediate branch and obtuse marginal branches are patent with minor irregularities but no significant stenoses.  LIMA to LAD: Patent. This is a small vessel with competitive filling from the native vessel.  Right coronary artery (RCA): The RCA is normal in caliber. There is no obstructive disease throughout its course. There is a faint collateral filling what appears to  be a distal subbranch of the left circumflex.  Left ventriculography: Left ventricular systolic function is moderately depressed. There is severe hypokinesis of the anterolateral wall. There is severe hypokinesis of the distal inferior wall. The estimated left ventricular ejection fraction is 45%.  Final Conclusions:   1. Nonobstructive LAD stenosis with a patent LIMA to LAD 2. Widely patent left circumflex and right coronary artery 3. Moderate segmental left ventricular systolic dysfunction   Recommendations: Continued medical therapy.  Tonny Bollman 12/07/2012, 12:10 PM

## 2012-12-07 NOTE — H&P (Signed)
History and Physical   Patient ID: Kristen [name] MRN: [id number], DOB/AGE: Nov 15, 1945 67 y.o. Date of Encounter: 12/07/2012  Primary Physician: Carrie Mew, MD Primary Cardiologist: SK  Chief Complaint:  Chest pain  HPI: Kristen [name] is a 67 y.o. female with a history of PAF/CAD/CABG. She has been anticoagulated. She was seen by Dr. Graciela Husbands 8/20 and had been having chest heaviness. Adenosine Cardiolite was intermediate risk and she was scheduled for cath. She is here today for the procedure. She has had 2-3 episodes of chest heaviness this week, including one this am, but is not having it now.  A cardiac catheterization was scheduled. She was told to continue her coumadin. She took 1/2 dose of her Levemir last pm. She did not take her metformin or insulin this am.   Past Medical History  Diagnosis Date  . CAD (coronary artery disease)   . PAF (paroxysmal atrial fibrillation)   . Hypertension   . Hyperlipidemia   . Hypokalemia   . Diabetes mellitus   . Depression   . Lumbar back pain   . LBBB (left bundle branch block)   . Headache(784.0)   . Lymphadenitis   . Arthritis   . Bursitis   . Chronic anticoagulation     on coumadin  . Heart palpitations   . Diastolic dysfunction     per echo in October 2012 with EF 50 to 55%  . 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 GERD (gastroesophageal reflux disease) 10/23/2003  . Fibromyalgia   . Blood transfusion without reported diagnosis     Surgical History:  Past Surgical History  Procedure Laterality Date  . Angioplasty  laminectomy  . Lumbar laminectomy      x3  . Coronary artery bypass graft      LIMA to LAD   . Abdominal hysterectomy    . Cholecystectomy    . Tonsillectomy    . Knee arthroscopy    . Sphincterotomy       I have reviewed the patient's current medications. Prior to Admission medications   Medication Sig Start Date End Date Taking? Authorizing Provider  ALPRAZolam Prudy Feeler) 1  MG tablet Take 1 mg by mouth 3 (three) times daily.   Yes Historical Provider, MD  atenolol (TENORMIN) 100 MG tablet Take 1 tablet (100 mg total) by mouth daily. 11/27/12  Yes Duke Salvia, MD  atorvastatin (LIPITOR) 20 MG tablet Take 40 mg by mouth daily.   Yes Historical Provider, MD  clotrimazole-betamethasone (LOTRISONE) cream Apply 1 application topically daily as needed (for itching).   Yes Historical Provider, MD  cyclobenzaprine (FLEXERIL) 10 MG tablet Take 10 mg by mouth 2 (two) times daily as needed. For muscle spasms   Yes Historical Provider, MD  furosemide (LASIX) 20 MG tablet Take 1 tablet (20 mg total) by mouth daily. 08/31/12  Yes Stacie Glaze, MD  gabapentin (NEURONTIN) 300 MG capsule Take 300 mg by mouth 3 (three) times daily.   Yes Historical Provider, MD  gabapentin (NEURONTIN) 300 MG capsule TAKE 1 CAPSULE THREE TIMES A DAY 12/05/12  Yes Stacie Glaze, MD  HYDROcodone-acetaminophen [facility]) 10-325 MG per tablet Take 1 tablet by mouth every 4 (four) hours as needed for pain.   Yes Historical Provider, MD  insulin detemir (LEVEMIR) 100 UNIT/ML injection Inject 50 Units into the skin 2 (two) times daily.   Yes Historical Provider, MD  lansoprazole (PREVACID) 30 MG capsule Take 30 mg by  mouth daily as needed. For heartburn   Yes Historical Provider, MD  Levomilnacipran HCl ER (FETZIMA) 40 MG CP24 Take 1 capsule by mouth daily. 05/07/12  Yes Stacie Glaze, MD  Menthol-Methyl Salicylate (ARTHRITIS HOT) 10-15 % CREA Apply 1 application topically daily as needed (for pain).   Yes Historical Provider, MD  metFORMIN (GLUCOPHAGE-XR) 500 MG 24 hr tablet Take 1,000 mg by mouth 2 (two) times daily.   Yes Historical Provider, MD  mupirocin ointment (BACTROBAN) 2 % Apply topically 3 (three) times daily. 11/12/12  Yes Stacie Glaze, MD  nitroGLYCERIN (NITROSTAT) 0.4 MG SL tablet Place 1 tablet (0.4 mg total) under the tongue every 5 (five) minutes as needed. For chest pain 07/24/12  Yes Kathleene Hazel, MD  potassium chloride SA (K-DUR,KLOR-CON) 20 MEQ tablet Take 20 mEq by mouth 2 (two) times daily.   Yes Historical Provider, MD  spironolactone (ALDACTONE) 25 MG tablet Take 25 mg by mouth 2 (two) times daily.   Yes Historical Provider, MD  telmisartan (MICARDIS) 40 MG tablet Take 1 tablet (40 mg total) by mouth daily. 08/20/12  Yes Stacie Glaze, MD  warfarin (COUMADIN) 6 MG tablet Take 3-6 mg by mouth daily. Takes 6mg  on mondays and thursdays Takes 3 mg all other days   Yes Historical Provider, MD   Scheduled Meds: . diazepam  5 mg Oral On Call  . sodium chloride  3 mL Intravenous Q12H   Continuous Infusions: . [START ON 12/08/2012] sodium chloride     PRN Meds:.sodium chloride, sodium chloride  Allergies:  Allergies  Allergen Reactions  . Cleocin [Clindamycin Hcl] Diarrhea  . Codeine Itching  . Doxycycline Hyclate Diarrhea  . Macrolides And Ketolides Diarrhea  . Metformin And Related Other (See Comments)    Must take XR form only, cannot tolerate Regular release metformin  . Morphine Hives  . Pentazocine Lactate Itching and Nausea And Vomiting  . Vibramycin [Doxycycline Calcium] Diarrhea and Rash  . Sulfonamide Derivatives Rash    History   Social History  . Marital Status: Legally Separated    Spouse Name: N/A    Number of Children: N/A  . Years of Education: N/A   Occupational History  . Not on file.   Social History Main Topics  . Smoking status: Former Smoker    Quit date: 03/07/2001  . Smokeless tobacco: Never Used  . Alcohol Use: No  . Drug Use: No  . Sexual Activity: Not on file   Other Topics Concern  . Not on file   Social History Narrative   Lives locally, has help available if needed.    Family History  Problem Relation Age of Onset  . Heart attack Father   . Heart disease Father   . Kidney disease    . Stroke    . Arthritis    . Hypertension    . Diabetes    . Stroke Mother   . Colon cancer Paternal Uncle    Family Status    Relation Status Death Age  . Father Deceased 63    MI  . Mother Deceased     Review of Systems:   Full 14-point review of systems otherwise negative except as noted above.  Physical Exam: Blood pressure 115/61, pulse 74, temperature 98.4 F (36.9 C), temperature source Oral, resp. rate 18, height 5\' 2"  (1.575 m), weight 224 lb (101.606 kg), SpO2 98.00%. General: Well developed, well nourished,female in no acute distress. Head: Normocephalic, atraumatic, sclera non-icteric, no  xanthomas, nares are without discharge. Dentition: good Neck: No carotid bruits. JVD not elevated. No thyromegally Lungs: Good expansion bilaterally. without wheezes or rhonchi.  Heart: Regular rate and rhythm with S1 S2.  No S3 or S4.  No murmur, no rubs, or gallops appreciated. Abdomen: Soft, non-tender, non-distended with normoactive bowel sounds. No hepatomegaly. No rebound/guarding. No obvious abdominal masses. Msk:  Strength and tone appear normal for age. No joint deformities or effusions, no spine or costo-vertebral angle tenderness. Extremities: No clubbing or cyanosis. No edema.  Distal pedal pulses are 2+ in 4 extrem, but a little harder to find in the RLE. Neuro: Alert and oriented X 3. Moves all extremities spontaneously. No focal deficits noted. Psych:  Responds to questions appropriately with a normal affect. Skin: No rashes or lesions noted  Labs:   Lab Results  Component Value Date   WBC 7.7 12/03/2012   HGB 13.6 12/03/2012   HCT 40.8 12/03/2012   MCV 93.3 12/03/2012   PLT 334.0 12/03/2012    Recent Labs  12/07/12 0757  INR 1.58*     Recent Labs Lab 12/03/12 1406  NA 140  K 3.8  CL 105  CO2 30  BUN 20  CREATININE 0.9  CALCIUM 9.4  GLUCOSE 128*   ASSESSMENT AND PLAN:  Principal Problem:   Angina decubitus - abnormal stress test, here today for cath.  Otherwise, continue home meds with sliding scale and check lipids (not done in the past year). Active Problems:    HYPERLIPIDEMIA NEC/NOS   HYPERTENSION   DM (diabetes mellitus), type 2 with peripheral vascular complications   Melida Quitter, PA-C 12/07/2012 8:32 AM Beeper [phone]

## 2012-12-07 NOTE — Interval H&P Note (Signed)
History and Physical Interval Note:  12/07/2012 11:14 AM  Kristen [name]  has presented today for surgery, with the diagnosis of cad  The various methods of treatment have been discussed with the patient and family. After consideration of risks, benefits and other options for treatment, the patient has consented to  Procedure(s): LEFT HEART CATHETERIZATION WITH CORONARY/GRAFT ANGIOGRAM as a surgical intervention .  The patient's history has been reviewed, patient examined, no change in status, stable for surgery.  I have reviewed the patient's chart and labs.  Questions were answered to the patient's satisfaction.    Cath Lab Visit (complete for each Cath Lab visit)  Clinical Evaluation Leading to the Procedure:   ACS: no  Non-ACS:    Anginal Classification: CCS III  Anti-ischemic medical therapy: Minimal Therapy (1 class of medications)  Non-Invasive Test Results: Intermediate-risk stress test findings: cardiac mortality 1-3%/year  Prior CABG: Previous CABG         Tonny Bollman

## 2012-12-10 ENCOUNTER — Telehealth: Payer: Self-pay | Admitting: Cardiovascular Disease

## 2012-12-10 NOTE — Telephone Encounter (Signed)
I spoke with the pt and made her aware that she is okay to take off her bandage.  The pt removed this while on the phone and states that her site does look good.  Pt denies pain, bleeding and swelling at cath site.  The pt does have bruising but this is expected since pt is on coumadin. The pt said that the swelling she referred to in earlier phone call is her arm.  The pt has a long history of swelling in her arm but this is normal for her. I spoke with Dr Excell Seltzer and he will follow this pt long term. He plans to see the pt again in 6 months.

## 2012-12-10 NOTE — Telephone Encounter (Signed)
New Problem:  Pt states Dr Excell Seltzer performed a cath on her on Friday. Pt states her wound site is swollen along with her left hand/fingers. Pt states she would like to know when she can take off her dressing and if it is normal to have the swelling?

## 2012-12-12 ENCOUNTER — Encounter: Payer: Self-pay | Admitting: Internal Medicine

## 2012-12-12 ENCOUNTER — Ambulatory Visit (INDEPENDENT_AMBULATORY_CARE_PROVIDER_SITE_OTHER): Payer: Medicare Other | Admitting: General Practice

## 2012-12-12 ENCOUNTER — Other Ambulatory Visit: Payer: Self-pay | Admitting: Internal Medicine

## 2012-12-12 DIAGNOSIS — Z7901 Long term (current) use of anticoagulants: Secondary | ICD-10-CM

## 2012-12-12 DIAGNOSIS — I4891 Unspecified atrial fibrillation: Secondary | ICD-10-CM

## 2012-12-13 ENCOUNTER — Telehealth: Payer: Self-pay | Admitting: *Deleted

## 2012-12-13 NOTE — Telephone Encounter (Signed)
Discussed normal sleep study results with patient. Patient is going to follow up her insomnia issues with someone per her statement.

## 2012-12-13 NOTE — Telephone Encounter (Signed)
Follow Up: ° ° ° °Pt returning call for results.  °

## 2012-12-13 NOTE — Telephone Encounter (Signed)
Left message for patient about sleep study results and insomnia issue.

## 2012-12-20 ENCOUNTER — Ambulatory Visit: Payer: Self-pay | Admitting: Podiatry

## 2012-12-21 ENCOUNTER — Telehealth: Payer: Self-pay | Admitting: Cardiovascular Disease

## 2012-12-21 NOTE — Telephone Encounter (Signed)
I spoke with the pt and she was looking in My Chart and saw Angina Decubitus listed as a problem from her recent hospitalization.  The pt called because she did not know what this term meant.  I made her aware that is angina in the resting or lying position.  I made her aware that she should not be concerned about this and that is just another term for Chest Pain.

## 2012-12-21 NOTE — Telephone Encounter (Signed)
New problem    Pt would like a call back about her dx please from 10/3.  Pt has some concerns.

## 2012-12-23 ENCOUNTER — Encounter: Payer: Self-pay | Admitting: Internal Medicine

## 2012-12-24 ENCOUNTER — Other Ambulatory Visit: Payer: Self-pay | Admitting: *Deleted

## 2012-12-24 DIAGNOSIS — R062 Wheezing: Secondary | ICD-10-CM

## 2012-12-25 ENCOUNTER — Encounter: Payer: Self-pay | Admitting: Internal Medicine

## 2012-12-25 ENCOUNTER — Ambulatory Visit (INDEPENDENT_AMBULATORY_CARE_PROVIDER_SITE_OTHER)
Admission: RE | Admit: 2012-12-25 | Discharge: 2012-12-25 | Disposition: A | Discharge status: Home / Self Care | Payer: Medicare Other | Source: Ambulatory Visit | Attending: Internal Medicine | Admitting: Internal Medicine

## 2012-12-25 DIAGNOSIS — R062 Wheezing: Secondary | ICD-10-CM

## 2012-12-25 NOTE — Telephone Encounter (Signed)
Chest xray looks ok- nothing going on.  Hope you feel better.

## 2012-12-26 ENCOUNTER — Ambulatory Visit: Payer: Self-pay | Admitting: Podiatry

## 2012-12-26 ENCOUNTER — Other Ambulatory Visit: Payer: Self-pay | Admitting: *Deleted

## 2012-12-26 MED ORDER — HYDROCODONE-ACETAMINOPHEN 10-325 MG PO TABS: 1.0000 | ORAL_TABLET | ORAL | Status: DC | PRN

## 2013-01-01 ENCOUNTER — Ambulatory Visit: Payer: Medicare Other | Admitting: Cardiovascular Disease

## 2013-01-02 ENCOUNTER — Other Ambulatory Visit: Payer: Self-pay | Admitting: Internal Medicine

## 2013-01-07 ENCOUNTER — Other Ambulatory Visit: Payer: Self-pay | Admitting: Internal Medicine

## 2013-01-18 ENCOUNTER — Ambulatory Visit (INDEPENDENT_AMBULATORY_CARE_PROVIDER_SITE_OTHER): Payer: Medicare Other | Admitting: General Practice

## 2013-01-18 DIAGNOSIS — Z7901 Long term (current) use of anticoagulants: Secondary | ICD-10-CM

## 2013-01-18 DIAGNOSIS — I4891 Unspecified atrial fibrillation: Secondary | ICD-10-CM

## 2013-01-18 LAB — POCT INR: INR: 2.3

## 2013-01-18 NOTE — Progress Notes (Signed)
Pre-visit discussion using our clinic review tool. No additional management support is needed unless otherwise documented below in the visit note.  

## 2013-02-04 ENCOUNTER — Telehealth: Payer: Self-pay | Admitting: Physician Assistant

## 2013-02-04 NOTE — Telephone Encounter (Signed)
Note opened in error.

## 2013-02-04 NOTE — Telephone Encounter (Signed)
Patient called answering service after hours. Her HR is slightly up this evening ranging from 95-120. She does not feel that it is irregular. She does not think she is in atrial fib. No CP, syncope, lightheadeness, fever, or any other acute symptoms. She has chronic unchanged mild SOB. Eating and drinking just fine. The only reason she noticed this was because of a HR monitor. She is pretty sure sure she took her medicines correctly this AM because she states it would be very unusual if she didn't. (She does not use a pillbox so she cannot verify if she took them 100% for sure.) Gave her option of going to UC/ER for evaluation & EKG to rule out rhythm issue, but she would prefer outpatient management for now. She will continue to monitor HR for now, and will call our office in AM to let us know how she is feeling. She knows to go to ER in the meantime if any symptoms arise. Woodford Strege PA-C

## 2013-02-05 ENCOUNTER — Telehealth: Payer: Self-pay | Admitting: Cardiovascular Disease

## 2013-02-05 NOTE — Telephone Encounter (Signed)
Left message on machine for pt to contact the office.   

## 2013-02-05 NOTE — Telephone Encounter (Signed)
New problem   Pt was in tachycardia yesterday. Was told by [facility] PA to call and let you know today, she may need a monitor placed. Please call pt.

## 2013-02-06 NOTE — Telephone Encounter (Signed)
I spoke with the pt and at this time she is doing okay.  The pt said her pulse is ranging between 75-80.  I made the pt aware that her most recent monitor in July showed a pulse range of 47-119.  I made the pt aware that at this time we can continue to have her monitor her pulse and she will call the office if she has further issues with her pulse. I will send this message to Dr Excell Seltzer to make him aware that I spoke with the pt.

## 2013-02-06 NOTE — Telephone Encounter (Signed)
Sounds good thx

## 2013-02-13 ENCOUNTER — Encounter: Payer: Self-pay | Admitting: Podiatry

## 2013-02-13 ENCOUNTER — Ambulatory Visit (INDEPENDENT_AMBULATORY_CARE_PROVIDER_SITE_OTHER): Payer: Medicare Other | Admitting: Podiatry

## 2013-02-13 VITALS — BP 156/87 | HR 98 | Resp 16

## 2013-02-13 DIAGNOSIS — L03039 Cellulitis of unspecified toe: Secondary | ICD-10-CM

## 2013-02-13 NOTE — Progress Notes (Signed)
Subjective:     Patient ID: Kristen [name], female   DOB: June 28, 1945, 67 y.o.   MRN: [id number]  HPI patient points to the left hallux lateral border stating that there is a small amount of drainage and I was concerned because it did not appear to be healing   Review of Systems     Objective:   Physical Exam Neurovascular status intact with no health history changes noted. Patient is found to have a   irritated lateral border left hallux with minimal drainage and slight discoloration Assessment:     Mild paronychia of the localized nature with no indications of acute infection    Plan:     Advised on continued soaks and bandage therapy during the day. If swelling or change occur patient is to let me now

## 2013-02-13 NOTE — Patient Instructions (Signed)
Long Term Care Instructions-Post Nail Surgery  You have had your ingrown toenail and root treated with a chemical.  This chemical causes a burn that will drain and ooze like a blister.  This can drain for 6-8 weeks or longer.  It is important to keep this area clean, covered, and follow the soaking instructions dispensed at the time of your surgery.  This area will eventually dry and form a scab.  Once the scab forms you no longer need to soak or apply a dressing.  If at any time you experience an increase in pain, redness, swelling, or drainage, you should contact the office as soon as possible. 

## 2013-02-15 ENCOUNTER — Ambulatory Visit (INDEPENDENT_AMBULATORY_CARE_PROVIDER_SITE_OTHER): Payer: Medicare Other | Admitting: General Practice

## 2013-02-15 DIAGNOSIS — I4891 Unspecified atrial fibrillation: Secondary | ICD-10-CM

## 2013-02-15 DIAGNOSIS — Z7901 Long term (current) use of anticoagulants: Secondary | ICD-10-CM

## 2013-02-15 LAB — POCT INR: INR: 2.2

## 2013-02-15 NOTE — Progress Notes (Signed)
Pre-visit discussion using our clinic review tool. No additional management support is needed unless otherwise documented below in the visit note.  

## 2013-02-25 ENCOUNTER — Other Ambulatory Visit: Payer: Self-pay | Admitting: Internal Medicine

## 2013-03-08 ENCOUNTER — Ambulatory Visit: Payer: Medicare Other | Admitting: Internal Medicine

## 2013-03-15 ENCOUNTER — Ambulatory Visit: Payer: Medicare Other | Admitting: Internal Medicine

## 2013-03-18 ENCOUNTER — Encounter: Payer: Self-pay | Admitting: Internal Medicine

## 2013-03-18 ENCOUNTER — Ambulatory Visit (INDEPENDENT_AMBULATORY_CARE_PROVIDER_SITE_OTHER): Payer: Medicare Other | Admitting: Internal Medicine

## 2013-03-18 VITALS — BP 130/80 | HR 76 | Temp 98.2°F | Resp 18 | Ht 62.0 in | Wt 234.0 lb

## 2013-03-18 DIAGNOSIS — I5042 Chronic combined systolic (congestive) and diastolic (congestive) heart failure: Secondary | ICD-10-CM

## 2013-03-18 DIAGNOSIS — L03039 Cellulitis of unspecified toe: Secondary | ICD-10-CM

## 2013-03-18 DIAGNOSIS — I10 Essential (primary) hypertension: Secondary | ICD-10-CM

## 2013-03-18 DIAGNOSIS — F32A Depression, unspecified: Secondary | ICD-10-CM

## 2013-03-18 DIAGNOSIS — L03032 Cellulitis of left toe: Secondary | ICD-10-CM

## 2013-03-18 DIAGNOSIS — F3289 Other specified depressive episodes: Secondary | ICD-10-CM

## 2013-03-18 DIAGNOSIS — F329 Major depressive disorder, single episode, unspecified: Secondary | ICD-10-CM

## 2013-03-18 MED ORDER — HYDROCODONE-ACETAMINOPHEN 10-325 MG PO TABS: 1.0000 | ORAL_TABLET | ORAL | Status: DC | PRN

## 2013-03-18 MED ORDER — MUPIROCIN 2 % EX OINT: 1.0000 "application " | TOPICAL_OINTMENT | Freq: Two times a day (BID) | CUTANEOUS | Status: DC

## 2013-03-18 NOTE — Progress Notes (Signed)
CINI not available this am

## 2013-03-18 NOTE — Progress Notes (Signed)
   Subjective:    Patient ID: Kristen [name], female    DOB: [birth year], 68 y.o.   MRN: [id number]  HPI  Hair loss  That is multifactorial Has toenail infection after Podiatrist removed nail Risks are DM and PAD DM has been poorly controlled And weight gain noted 10 pounds "loosing control" stress from ex? Moderate edema in legs   Review of Systems  Constitutional: Negative for activity change, appetite change and fatigue.  HENT: Negative for congestion, ear pain, postnasal drip and sinus pressure.   Eyes: Negative for redness and visual disturbance.  Respiratory: Negative for cough, shortness of breath and wheezing.   Gastrointestinal: Negative for abdominal pain and abdominal distention.  Genitourinary: Negative for dysuria, frequency and menstrual problem.  Musculoskeletal: Negative for arthralgias, joint swelling, myalgias and neck pain.  Skin: Negative for rash and wound.  Neurological: Negative for dizziness, weakness and headaches.  Hematological: Negative for adenopathy. Does not bruise/bleed easily.  Psychiatric/Behavioral: Negative for sleep disturbance and decreased concentration.       Objective:   Physical Exam  Constitutional: She is oriented to person, place, and time. She appears well-developed and well-nourished. No distress.  HENT:  Head: Normocephalic and atraumatic.  Eyes: Conjunctivae and EOM are normal. Pupils are equal, round, and reactive to light.  Neck: Normal range of motion. Neck supple. No JVD present. No tracheal deviation present. No thyromegaly present.  Cardiovascular: Normal rate and regular rhythm.   Murmur heard. 1 plus edema  Pulmonary/Chest: Effort normal and breath sounds normal. She has no wheezes. She exhibits no tenderness.  Abdominal: Soft. Bowel sounds are normal.  Musculoskeletal: Normal range of motion. She exhibits no edema and no tenderness.  Lymphadenopathy:    She has no cervical adenopathy.  Neurological: She is  alert and oriented to person, place, and time. She has normal reflexes. No cranial nerve deficit.  Skin: Skin is warm and dry. She is not diaphoretic.  Psychiatric: She has a normal mood and affect. Her behavior is normal.          Assessment & Plan:  Superficial infection of great toe nail at site of avulsion  Treat with debridement and bactroban locally  Refer to cardiac rehab due to weight gain and mild chronic CHF

## 2013-03-18 NOTE — Patient Instructions (Addendum)
NIOXIN shampoo or Rogain ( generic ok)    Soak foot in 1 cup of white vinegar and 1 gallon of warm water  Twice a week   Monitor heart rate on the fetzima The drug might increase by an average of 10 BPM but if it goes up high stop the drug and call

## 2013-03-19 ENCOUNTER — Telehealth: Payer: Self-pay | Admitting: Internal Medicine

## 2013-03-19 NOTE — Telephone Encounter (Signed)
Relevant patient education assigned to patient using Emmi. ° °

## 2013-03-22 ENCOUNTER — Ambulatory Visit (INDEPENDENT_AMBULATORY_CARE_PROVIDER_SITE_OTHER): Payer: Medicare Other | Admitting: General Practice

## 2013-03-22 ENCOUNTER — Ambulatory Visit: Payer: Medicare Other | Admitting: Internal Medicine

## 2013-03-22 DIAGNOSIS — I4891 Unspecified atrial fibrillation: Secondary | ICD-10-CM

## 2013-03-22 DIAGNOSIS — Z7901 Long term (current) use of anticoagulants: Secondary | ICD-10-CM

## 2013-03-22 LAB — POCT INR: INR: 2.6

## 2013-03-22 NOTE — Progress Notes (Signed)
Pre-visit discussion using our clinic review tool. No additional management support is needed unless otherwise documented below in the visit note.  

## 2013-03-27 ENCOUNTER — Other Ambulatory Visit: Payer: Self-pay | Admitting: Internal Medicine

## 2013-03-29 ENCOUNTER — Encounter: Payer: Self-pay | Admitting: Internal Medicine

## 2013-03-31 ENCOUNTER — Other Ambulatory Visit: Payer: Self-pay | Admitting: Internal Medicine

## 2013-04-01 NOTE — Telephone Encounter (Signed)
Pt is out of aprazolam

## 2013-04-10 ENCOUNTER — Telehealth: Payer: Self-pay | Admitting: Internal Medicine

## 2013-04-10 ENCOUNTER — Other Ambulatory Visit: Payer: Self-pay | Admitting: *Deleted

## 2013-04-10 NOTE — Telephone Encounter (Signed)
Per dr Arnoldo Morale- send to podiatrist- dr Servando Salina and dr sheards name and number give to pt- no swelling of feet. Suggested to keep throat moist and monitor wheezing in throat

## 2013-04-10 NOTE — Telephone Encounter (Signed)
Pt was seen on 03-18-13 and her great toe is no better. Please advise. Pt also heard wheezing in her throat area this morning

## 2013-04-12 ENCOUNTER — Ambulatory Visit: Payer: Medicare Other | Admitting: Internal Medicine

## 2013-04-12 ENCOUNTER — Encounter (HOSPITAL_COMMUNITY): Payer: Self-pay | Admitting: Emergency Medicine

## 2013-04-12 ENCOUNTER — Telehealth: Payer: Self-pay | Admitting: Internal Medicine

## 2013-04-12 ENCOUNTER — Emergency Department (HOSPITAL_COMMUNITY): Payer: Medicare Other

## 2013-04-12 ENCOUNTER — Telehealth: Payer: Self-pay | Admitting: *Deleted

## 2013-04-12 ENCOUNTER — Emergency Department (HOSPITAL_COMMUNITY)
Admission: EM | Admit: 2013-04-12 | Discharge: 2013-04-12 | Disposition: A | Discharge status: Home / Self Care | Payer: Medicare Other | Attending: Emergency Medicine | Admitting: Emergency Medicine

## 2013-04-12 DIAGNOSIS — R0609 Other forms of dyspnea: Secondary | ICD-10-CM | POA: Insufficient documentation

## 2013-04-12 DIAGNOSIS — I4891 Unspecified atrial fibrillation: Secondary | ICD-10-CM | POA: Insufficient documentation

## 2013-04-12 DIAGNOSIS — R140 Abdominal distension (gaseous): Secondary | ICD-10-CM

## 2013-04-12 DIAGNOSIS — R142 Eructation: Secondary | ICD-10-CM | POA: Insufficient documentation

## 2013-04-12 DIAGNOSIS — R141 Gas pain: Secondary | ICD-10-CM | POA: Insufficient documentation

## 2013-04-12 DIAGNOSIS — Z951 Presence of aortocoronary bypass graft: Secondary | ICD-10-CM | POA: Insufficient documentation

## 2013-04-12 DIAGNOSIS — I10 Essential (primary) hypertension: Secondary | ICD-10-CM | POA: Insufficient documentation

## 2013-04-12 DIAGNOSIS — E119 Type 2 diabetes mellitus without complications: Secondary | ICD-10-CM | POA: Insufficient documentation

## 2013-04-12 DIAGNOSIS — F329 Major depressive disorder, single episode, unspecified: Secondary | ICD-10-CM | POA: Insufficient documentation

## 2013-04-12 DIAGNOSIS — Z794 Long term (current) use of insulin: Secondary | ICD-10-CM | POA: Insufficient documentation

## 2013-04-12 DIAGNOSIS — F3289 Other specified depressive episodes: Secondary | ICD-10-CM | POA: Insufficient documentation

## 2013-04-12 DIAGNOSIS — R0602 Shortness of breath: Secondary | ICD-10-CM | POA: Insufficient documentation

## 2013-04-12 DIAGNOSIS — Z79899 Other long term (current) drug therapy: Secondary | ICD-10-CM | POA: Insufficient documentation

## 2013-04-12 DIAGNOSIS — R0989 Other specified symptoms and signs involving the circulatory and respiratory systems: Secondary | ICD-10-CM | POA: Insufficient documentation

## 2013-04-12 DIAGNOSIS — N390 Urinary tract infection, site not specified: Secondary | ICD-10-CM | POA: Insufficient documentation

## 2013-04-12 DIAGNOSIS — I2510 Atherosclerotic heart disease of native coronary artery without angina pectoris: Secondary | ICD-10-CM | POA: Insufficient documentation

## 2013-04-12 DIAGNOSIS — IMO0001 Reserved for inherently not codable concepts without codable children: Secondary | ICD-10-CM | POA: Insufficient documentation

## 2013-04-12 DIAGNOSIS — M129 Arthropathy, unspecified: Secondary | ICD-10-CM | POA: Insufficient documentation

## 2013-04-12 DIAGNOSIS — Z87891 Personal history of nicotine dependence: Secondary | ICD-10-CM | POA: Insufficient documentation

## 2013-04-12 DIAGNOSIS — E876 Hypokalemia: Secondary | ICD-10-CM | POA: Insufficient documentation

## 2013-04-12 DIAGNOSIS — R143 Flatulence: Secondary | ICD-10-CM

## 2013-04-12 DIAGNOSIS — E785 Hyperlipidemia, unspecified: Secondary | ICD-10-CM | POA: Insufficient documentation

## 2013-04-12 DIAGNOSIS — Z86718 Personal history of other venous thrombosis and embolism: Secondary | ICD-10-CM | POA: Insufficient documentation

## 2013-04-12 DIAGNOSIS — M7989 Other specified soft tissue disorders: Secondary | ICD-10-CM | POA: Insufficient documentation

## 2013-04-12 DIAGNOSIS — K219 Gastro-esophageal reflux disease without esophagitis: Secondary | ICD-10-CM | POA: Insufficient documentation

## 2013-04-12 DIAGNOSIS — Z862 Personal history of diseases of the blood and blood-forming organs and certain disorders involving the immune mechanism: Secondary | ICD-10-CM | POA: Insufficient documentation

## 2013-04-12 LAB — BASIC METABOLIC PANEL
BUN: 14 mg/dL (ref 6–23)
CALCIUM: 9.1 mg/dL (ref 8.4–10.5)
CO2: 21 mEq/L (ref 19–32)
Chloride: 101 mEq/L (ref 96–112)
Creatinine, Ser: 0.64 mg/dL (ref 0.50–1.10)
GFR calc non Af Amer: >90 mL/min (ref >90)
Glucose, Bld: 154 mg/dL — ABNORMAL HIGH (ref 70–99)
POTASSIUM: 4.3 meq/L (ref 3.7–5.3)
Sodium: 140 mEq/L (ref 137–147)

## 2013-04-12 LAB — HEPATIC FUNCTION PANEL
ALT: 15 U/L (ref 0–35)
AST: 19 U/L (ref 0–37)
Albumin: 3.7 g/dL (ref 3.5–5.2)
Alkaline Phosphatase: 52 U/L (ref 39–117)
Bilirubin, Direct: <0.2 mg/dL (ref 0.0–0.3)
Total Bilirubin: 0.3 mg/dL (ref 0.3–1.2)
Total Protein: 7.3 g/dL (ref 6.0–8.3)

## 2013-04-12 LAB — URINALYSIS, ROUTINE W REFLEX MICROSCOPIC
BILIRUBIN URINE: NEGATIVE
Glucose, UA: NEGATIVE mg/dL
Ketones, ur: NEGATIVE mg/dL
NITRITE: NEGATIVE
Protein, ur: NEGATIVE mg/dL
SPECIFIC GRAVITY, URINE: 1.012 (ref 1.005–1.030)
Urobilinogen, UA: 0.2 mg/dL (ref 0.0–1.0)
pH: 5 (ref 5.0–8.0)

## 2013-04-12 LAB — PROTIME-INR
INR: 2.83 — ABNORMAL HIGH (ref 0.00–1.49)
Prothrombin Time: 28.8 seconds — ABNORMAL HIGH (ref 11.6–15.2)

## 2013-04-12 LAB — CBC WITH DIFFERENTIAL/PLATELET
BASOS PCT: 1 % (ref 0–1)
Basophils Absolute: 0 10*3/uL (ref 0.0–0.1)
Eosinophils Absolute: 0.1 10*3/uL (ref 0.0–0.7)
Eosinophils Relative: 2 % (ref 0–5)
HCT: 38.2 % (ref 36.0–46.0)
Hemoglobin: 13.1 g/dL (ref 12.0–15.0)
Lymphocytes Relative: 47 % — ABNORMAL HIGH (ref 12–46)
Lymphs Abs: 2.8 10*3/uL (ref 0.7–4.0)
MCH: 31.2 pg (ref 26.0–34.0)
MCHC: 34.3 g/dL (ref 30.0–36.0)
MCV: 91 fL (ref 78.0–100.0)
Monocytes Absolute: 0.5 10*3/uL (ref 0.1–1.0)
Monocytes Relative: 9 % (ref 3–12)
NEUTROS PCT: 42 % — AB (ref 43–77)
Neutro Abs: 2.6 10*3/uL (ref 1.7–7.7)
Platelets: 280 10*3/uL (ref 150–400)
RBC: 4.2 MIL/uL (ref 3.87–5.11)
RDW: 14.1 % (ref 11.5–15.5)
WBC: 6 10*3/uL (ref 4.0–10.5)

## 2013-04-12 LAB — URINE MICROSCOPIC-ADD ON

## 2013-04-12 LAB — PRO B NATRIURETIC PEPTIDE: Pro B Natriuretic peptide (BNP): 66.3 pg/mL (ref 0–125)

## 2013-04-12 MED ORDER — FOSFOMYCIN TROMETHAMINE 3 G PO PACK
3.0000 g | PACK | Freq: Once | ORAL | Status: AC
  Administered 2013-04-12: 3 g via ORAL
  Filled 2013-04-12: qty 3

## 2013-04-12 NOTE — Telephone Encounter (Signed)
Patient Information:  Caller Name: Kristen [name]  Phone: [phone]  Patient: Kristen, [name]  Gender: Female  DOB: 1946-01-01  Age: 68 Years  PCP: Benay Pillow (Adults only)  Office Follow Up:  Does the office need to follow up with this patient?: No  Instructions For The Office: N/A   Symptoms  Reason For Call & Symptoms: Clear watery drainage from right eye, teary and a little red;  Afebrile;  Has had a cough some, has a little occasional nasal drainage.  Reviewed Health History In EMR: Yes  Reviewed Medications In EMR: Yes  Reviewed Allergies In EMR: Yes  Reviewed Surgeries / Procedures: Yes  Date of Onset of Symptoms: 04/12/2013  Guideline(s) Used:  Eye - Pus or Discharge  Disposition Per Guideline:   Home Care  Reason For Disposition Reached:   Very small amount of discharge and only in corner of eye  Advice Given:  Reassurance:  Viral conjunctivitis (pink eye) does not need antibiotic treatment. It gets better by itself. Usually it's gone in 2 or 3 days.  Here is some care advice that should help.  Eyelid Cleansing:   Gently wash eyelids and lashes with warm water and wet cotton balls (or cotton gauze). Remove all the dried and liquid pus.  Do this as often as needed.  Call Back If  Pus increases in amount  Pus in corner of eye lasts over 3 days  Eyelid becomes red or swollen  You become worse.  Patient Will Follow Care Advice:  YES

## 2013-04-12 NOTE — ED Notes (Signed)
Called lab to check on the PT INR  Stated they were in the process of entering the results

## 2013-04-12 NOTE — Telephone Encounter (Signed)
Pt called to cancel her appt. Pt stated she was in the ER last night with severely swollen legs, short of breath, leg and shoulder pain and feeling really bad. Pt states she has conjunctivitis, UTI and feeling bad. Pt will call and re-schedule appt the first of next week. Be advised.

## 2013-04-12 NOTE — ED Notes (Signed)
Patient presents stating she feels like her left arm and leg is larger than the right side.  Also has difficulty completely emptying her bladder also abd distended

## 2013-04-12 NOTE — ED Provider Notes (Signed)
CSN: [id number]     Arrival date & time 04/12/13  0140 History   First MD Initiated Contact with Patient 04/12/13 0217     Chief Complaint  Patient presents with  . Fluid Retention    (Consider location/radiation/quality/duration/timing/severity/associated sxs/prior Treatment) HPI 68 yo female presents to to the ER from home with complaint of abdominal distension, lower extremity swelling L>R, and upper right extremity swelling.  She also reports some mild sob with exertion.  Pt also reports urinary frequency and sense of incomplete bladder emptying.  No chest pain.  H/o CAD, but denies h/o CHF.  Pt is on coumadin for Afib and stroke in 2005.  Sxs have been ongoing for the last 2-3 days.  She reports she has h/o left leg edema, had u/s done about 1.5 years ago and was told she had venous insufficiency in that leg.  Has not been wearing her compression stockings.  She reports her left arm often swells.  She is concerned she may have developed CHF. Past Medical History  Diagnosis Date  . CAD (coronary artery disease)   . PAF (paroxysmal atrial fibrillation)   . Hypertension   . Hyperlipidemia   . Hypokalemia   . Diabetes mellitus   . Depression   . Lumbar back pain   . LBBB (left bundle branch block)   . Headache(784.0)   . Lymphadenitis   . Arthritis   . Bursitis   . Chronic anticoagulation     on coumadin  . Heart palpitations   . Diastolic dysfunction     per echo in October 2012 with EF 50 to 55%  . 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 GERD (gastroesophageal reflux disease) 10/23/2003  . Fibromyalgia   . Blood transfusion without reported diagnosis    Past Surgical History  Procedure Laterality Date  . Angioplasty  laminectomy  . Lumbar laminectomy      x3  . Coronary artery bypass graft      LIMA to LAD   . Abdominal hysterectomy    . Cholecystectomy    . Tonsillectomy    . Knee arthroscopy    . Sphincterotomy     Family History  Problem Relation Age of Onset   . Heart attack Father   . Heart disease Father   . Kidney disease    . Stroke    . Arthritis    . Hypertension    . Diabetes    . Stroke Mother   . Colon cancer Paternal Uncle    History  Substance Use Topics  . Smoking status: Former Smoker    Quit date: 03/07/2001  . Smokeless tobacco: Never Used  . Alcohol Use: No   OB History   Grav Para Term Preterm Abortions TAB SAB Ect Mult Living                 Review of Systems  See History of Present Illness; otherwise all other systems are reviewed and negative  Allergies  Cleocin; Codeine; Doxycycline hyclate; Macrolides and ketolides; Metformin and related; Morphine; Pentazocine lactate; Vibramycin; and Sulfonamide derivatives  Home Medications   Current Outpatient Rx  Name  Route  Sig  Dispense  Refill  . ALPRAZolam (XANAX) 1 MG tablet      TAKE 1 TABLET 3 TIMES A DAY AS NEEDED   90 tablet   2   . atenolol (TENORMIN) 100 MG tablet   Oral   Take 1 tablet (100 mg total) by  mouth daily.   90 tablet   3   . atorvastatin (LIPITOR) 20 MG tablet   Oral   Take 40 mg by mouth daily.         . clotrimazole-betamethasone (LOTRISONE) cream   Topical   Apply 1 application topically daily as needed (for itching).         . cyclobenzaprine (FLEXERIL) 10 MG tablet   Oral   Take 10 mg by mouth 2 (two) times daily as needed. For muscle spasms         . furosemide (LASIX) 20 MG tablet      TAKE 1 TABLET (20 MG TOTAL) BY MOUTH DAILY.   30 tablet   6   . gabapentin (NEURONTIN) 300 MG capsule      TAKE 1 CAPSULE THREE TIMES A DAY   90 capsule   2   . HYDROcodone-acetaminophen (NORCO) 10-325 MG per tablet   Oral   Take 1 tablet by mouth every 4 (four) hours as needed.   150 tablet   0   . HYDROcodone-acetaminophen (NORCO) 10-325 MG per tablet   Oral   Take 1 tablet by mouth every 4 (four) hours as needed for pain.         Marland Kitchen insulin detemir (LEVEMIR) 100 UNIT/ML injection   Subcutaneous   Inject 50  Units into the skin 2 (two) times daily.         . lansoprazole (PREVACID) 30 MG capsule   Oral   Take 30 mg by mouth daily as needed. For heartburn         . Levomilnacipran HCl ER (FETZIMA) 40 MG CP24   Oral   Take 1 capsule by mouth daily.   30 capsule      . Menthol-Methyl Salicylate (ARTHRITIS HOT) 10-15 % CREA   Apply externally   Apply 1 application topically daily as needed (for pain).         . metFORMIN (GLUCOPHAGE-XR) 500 MG 24 hr tablet      TAKE 2 TABLETS (1,000 MG TOTAL) BY MOUTH 2 (TWO) TIMES DAILY WITH A MEAL.   120 tablet   3   . mupirocin ointment (BACTROBAN) 2 %   Topical   Apply topically 3 (three) times daily.   22 g   0   . mupirocin ointment (BACTROBAN) 2 %   Nasal   Place 1 application into the nose 2 (two) times daily.   22 g   0   . nitroGLYCERIN (NITROSTAT) 0.4 MG SL tablet   Sublingual   Place 1 tablet (0.4 mg total) under the tongue every 5 (five) minutes as needed. For chest pain   30 tablet   6   . potassium chloride SA (K-DUR,KLOR-CON) 20 MEQ tablet   Oral   Take 20 mEq by mouth 2 (two) times daily.         Marland Kitchen spironolactone (ALDACTONE) 25 MG tablet   Oral   Take 25 mg by mouth 2 (two) times daily.         Marland Kitchen telmisartan (MICARDIS) 40 MG tablet      TAKE 1 TABLET (40 MG TOTAL) BY MOUTH DAILY.   90 tablet   2   . warfarin (COUMADIN) 6 MG tablet   Oral   Take 3-6 mg by mouth daily. Takes 6mg  on mondays and thursdays Takes 3 mg all other days          BP 150/81  Pulse 88  Temp(Src) 98.1  F (36.7 C) (Oral)  Resp 18  Ht 5\' 2"  (1.575 m)  Wt 237 lb (107.502 kg)  BMI 43.34 kg/m2  SpO2 100% Physical Exam  Nursing note and vitals reviewed. Constitutional: She is oriented to person, place, and time. She appears well-developed and well-nourished.  HENT:  Head: Normocephalic and atraumatic.  Nose: Nose normal.  Mouth/Throat: Oropharynx is clear and moist.  Eyes: Conjunctivae and EOM are normal. Pupils are equal,  round, and reactive to light.  Neck: Normal range of motion. Neck supple. No JVD present. No tracheal deviation present. No thyromegaly present.  Cardiovascular: Normal rate, regular rhythm, normal heart sounds and intact distal pulses.  Exam reveals no gallop and no friction rub.   No murmur heard. Pulmonary/Chest: Effort normal and breath sounds normal. No stridor. No respiratory distress. She has no wheezes. She has no rales. She exhibits no tenderness.  Abdominal: Soft. Bowel sounds are normal. She exhibits no distension and no mass. There is no tenderness. There is no rebound and no guarding.  Musculoskeletal: Normal range of motion. She exhibits edema (trace left lower extremity). She exhibits no tenderness.  Lymphadenopathy:    She has no cervical adenopathy.  Neurological: She is alert and oriented to person, place, and time. She has normal reflexes. No cranial nerve deficit. She exhibits normal muscle tone. Coordination normal.  Skin: Skin is warm and dry. No rash noted. No erythema. No pallor.  Psychiatric: She has a normal mood and affect. Her behavior is normal. Judgment and thought content normal.    ED Course  Procedures (including critical care time) Labs Review Labs Reviewed  CBC WITH DIFFERENTIAL - Abnormal; Notable for the following:    Neutrophils Relative % 42 (*)    Lymphocytes Relative 47 (*)    All other components within normal limits  BASIC METABOLIC PANEL - Abnormal; Notable for the following:    Glucose, Bld 154 (*)    All other components within normal limits  URINALYSIS, ROUTINE W REFLEX MICROSCOPIC - Abnormal; Notable for the following:    APPearance CLOUDY (*)    Hgb urine dipstick MODERATE (*)    Leukocytes, UA LARGE (*)    All other components within normal limits  URINE MICROSCOPIC-ADD ON - Abnormal; Notable for the following:    Bacteria, UA MANY (*)    All other components within normal limits  URINE CULTURE  PRO B NATRIURETIC PEPTIDE  HEPATIC  FUNCTION PANEL  PROTIME-INR   Imaging Review Dg Chest 2 View  04/12/2013   CLINICAL DATA:  Chest pain  EXAM: CHEST  2 VIEW  COMPARISON:  Prior radiograph from 12/25/2012  FINDINGS: Median sternotomy wires remain intact.  Mild cardiomegaly is stable.  Lungs are mildly hypoinflated. There is mild diffuse prominence of the interstitial markings, suggesting mild pulmonary interstitial edema. No definite pleural effusion. No focal infiltrate. No pneumothorax.  Osseous structures and soft tissues are unchanged.  IMPRESSION: Stable cardiomegaly with mild diffuse prominence of the interstitial markings, suggesting mild pulmonary interstitial edema.   Electronically Signed   By: Jeannine Boga M.D.   On: 04/12/2013 03:17    EKG Interpretation   None       MDM   1. Urinary tract infection   2. Dyspnea on exertion   3. Bloating    68 year old female with concern for peripheral edema, abdominal distention.  We'll get labs, check PT/INR, bladder scan pre-and post-urination.  We'll also check UA for possible UTI.  5:13 AM No signs of CHF/fluid overload.  Significant UTI noted, multiple abx allergies.  Will treat with fosfomycin, one time dosing.  Pt instructed to f/u with pcm early next week for recheck.  Urine sent for culture.     Kalman Drape, MD 04/12/13 [phone]

## 2013-04-12 NOTE — Discharge Instructions (Signed)
You were treated for your urinary tract infection in the ER.  You should not require further antibiotics.  Please follow up with your doctor for recheck early next week.  Return to the ER for worsening condition or new concerning symptoms.    Bloating Bloating is the feeling of fullness in your belly. You may feel as though your pants are too tight. Often the cause of bloating is overeating, retaining fluids, or having gas in your bowel. It is also caused by swallowing air and eating foods that cause gas. Irritable bowel syndrome is one of the most common causes of bloating. Constipation is also a common cause. Sometimes more serious problems can cause bloating. SYMPTOMS  Usually there is a feeling of fullness, as though your abdomen is bulged out. There may be mild discomfort.  DIAGNOSIS  Usually no particular testing is necessary for most bloating. If the condition persists and seems to become worse, your caregiver may do additional testing.  TREATMENT   There is no direct treatment for bloating.  Do not put gas into the bowel. Avoid chewing gum and sucking on candy. These tend to make you swallow air. Swallowing air can also be a nervous habit. Try to avoid this.  Avoiding high residue diets will help. Eat foods with soluble fibers (examples include root vegetables, apples, or barley) and substitute dairy products with soy and rice products. This helps irritable bowel syndrome.  If constipation is the cause, then a high residue diet with more fiber will help.  Avoid carbonated beverages.  Over-the-counter preparations are available that help reduce gas. Your pharmacist can help you with this. SEEK MEDICAL CARE IF:   Bloating continues and seems to be getting worse.  You notice a weight gain.  You have a weight loss but the bloating is getting worse.  You have changes in your bowel habits or develop nausea or vomiting. SEEK IMMEDIATE MEDICAL CARE IF:   You develop shortness of  breath or swelling in your legs.  You have an increase in abdominal pain or develop chest pain. Document Released: 12/22/2005 Document Revised: 05/16/2011 Document Reviewed: 02/09/2007 [facility] Patient Information 2014 [place].  Urinary Tract Infection Urinary tract infections (UTIs) can develop anywhere along your urinary tract. Your urinary tract is your body's drainage system for removing wastes and extra water. Your urinary tract includes two kidneys, two ureters, a bladder, and a urethra. Your kidneys are a pair of bean-shaped organs. Each kidney is about the size of your fist. They are located below your ribs, one on each side of your spine. CAUSES Infections are caused by microbes, which are microscopic organisms, including fungi, viruses, and bacteria. These organisms are so small that they can only be seen through a microscope. Bacteria are the microbes that most commonly cause UTIs. SYMPTOMS  Symptoms of UTIs may vary by age and gender of the patient and by the location of the infection. Symptoms in young women typically include a frequent and intense urge to urinate and a painful, burning feeling in the bladder or urethra during urination. Older women and men are more likely to be tired, shaky, and weak and have muscle aches and abdominal pain. A fever may mean the infection is in your kidneys. Other symptoms of a kidney infection include pain in your back or sides below the ribs, nausea, and vomiting. DIAGNOSIS To diagnose a UTI, your caregiver will ask you about your symptoms. Your caregiver also will ask to provide a urine sample. The urine  sample will be tested for bacteria and white blood cells. White blood cells are made by your body to help fight infection. TREATMENT  Typically, UTIs can be treated with medication. Because most UTIs are caused by a bacterial infection, they usually can be treated with the use of antibiotics. The choice of antibiotic and length of treatment  depend on your symptoms and the type of bacteria causing your infection. HOME CARE INSTRUCTIONS  If you were prescribed antibiotics, take them exactly as your caregiver instructs you. Finish the medication even if you feel better after you have only taken some of the medication.  Drink enough water and fluids to keep your urine clear or pale yellow.  Avoid caffeine, tea, and carbonated beverages. They tend to irritate your bladder.  Empty your bladder often. Avoid holding urine for long periods of time.  Empty your bladder before and after sexual intercourse.  After a bowel movement, women should cleanse from front to back. Use each tissue only once. SEEK MEDICAL CARE IF:   You have back pain.  You develop a fever.  Your symptoms do not begin to resolve within 3 days. SEEK IMMEDIATE MEDICAL CARE IF:   You have severe back pain or lower abdominal pain.  You develop chills.  You have nausea or vomiting.  You have continued burning or discomfort with urination. MAKE SURE YOU:   Understand these instructions.  Will watch your condition.  Will get help right away if you are not doing well or get worse. Document Released: 12/01/2004 Document Revised: 08/23/2011 Document Reviewed: 04/01/2011 [facility] Patient Information 2014 [place].  Shortness of Breath Shortness of breath means you have trouble breathing. Shortness of breath needs medical care right away. HOME CARE   Do not smoke.  Avoid being around chemicals or things (paint fumes, dust) that may bother your breathing.  Rest as needed. Slowly begin your normal activities.  Only take medicines as told by your doctor.  Keep all doctor visits as told. GET HELP RIGHT AWAY IF:   Your shortness of breath gets worse.  You feel lightheaded, pass out (faint), or have a cough that is not helped by medicine.  You cough up blood.  You have pain with breathing.  You have pain in your chest, arms, shoulders, or  belly (abdomen).  You have a fever.  You cannot walk up stairs or exercise the way you normally do.  You do not get better in the time expected.  You have a hard time doing normal activities even with rest.  You have problems with your medicines.  You have any new symptoms. MAKE SURE YOU:  Understand these instructions.  Will watch your condition.  Will get help right away if you are not doing well or get worse. Document Released: 08/10/2007 Document Revised: 08/23/2011 Document Reviewed: 05/09/2011 [facility] Patient Information 2014 [place], [place].

## 2013-04-13 ENCOUNTER — Encounter (HOSPITAL_COMMUNITY): Payer: Self-pay | Admitting: Emergency Medicine

## 2013-04-13 ENCOUNTER — Emergency Department (HOSPITAL_COMMUNITY)
Admission: EM | Admit: 2013-04-13 | Discharge: 2013-04-14 | Disposition: A | Discharge status: Home / Self Care | Payer: Medicare Other | Attending: Emergency Medicine | Admitting: Emergency Medicine

## 2013-04-13 DIAGNOSIS — Z794 Long term (current) use of insulin: Secondary | ICD-10-CM | POA: Insufficient documentation

## 2013-04-13 DIAGNOSIS — M129 Arthropathy, unspecified: Secondary | ICD-10-CM | POA: Insufficient documentation

## 2013-04-13 DIAGNOSIS — I2510 Atherosclerotic heart disease of native coronary artery without angina pectoris: Secondary | ICD-10-CM | POA: Insufficient documentation

## 2013-04-13 DIAGNOSIS — E785 Hyperlipidemia, unspecified: Secondary | ICD-10-CM | POA: Insufficient documentation

## 2013-04-13 DIAGNOSIS — Z87891 Personal history of nicotine dependence: Secondary | ICD-10-CM | POA: Insufficient documentation

## 2013-04-13 DIAGNOSIS — Z8673 Personal history of transient ischemic attack (TIA), and cerebral infarction without residual deficits: Secondary | ICD-10-CM | POA: Insufficient documentation

## 2013-04-13 DIAGNOSIS — I10 Essential (primary) hypertension: Secondary | ICD-10-CM | POA: Insufficient documentation

## 2013-04-13 DIAGNOSIS — D689 Coagulation defect, unspecified: Secondary | ICD-10-CM | POA: Insufficient documentation

## 2013-04-13 DIAGNOSIS — IMO0002 Reserved for concepts with insufficient information to code with codable children: Secondary | ICD-10-CM | POA: Insufficient documentation

## 2013-04-13 DIAGNOSIS — Z872 Personal history of diseases of the skin and subcutaneous tissue: Secondary | ICD-10-CM | POA: Insufficient documentation

## 2013-04-13 DIAGNOSIS — Z9861 Coronary angioplasty status: Secondary | ICD-10-CM | POA: Insufficient documentation

## 2013-04-13 DIAGNOSIS — I4460 Unspecified fascicular block: Secondary | ICD-10-CM | POA: Insufficient documentation

## 2013-04-13 DIAGNOSIS — F3289 Other specified depressive episodes: Secondary | ICD-10-CM | POA: Insufficient documentation

## 2013-04-13 DIAGNOSIS — Z79899 Other long term (current) drug therapy: Secondary | ICD-10-CM | POA: Insufficient documentation

## 2013-04-13 DIAGNOSIS — F329 Major depressive disorder, single episode, unspecified: Secondary | ICD-10-CM | POA: Insufficient documentation

## 2013-04-13 DIAGNOSIS — Z951 Presence of aortocoronary bypass graft: Secondary | ICD-10-CM | POA: Insufficient documentation

## 2013-04-13 DIAGNOSIS — K219 Gastro-esophageal reflux disease without esophagitis: Secondary | ICD-10-CM | POA: Insufficient documentation

## 2013-04-13 DIAGNOSIS — R197 Diarrhea, unspecified: Secondary | ICD-10-CM | POA: Insufficient documentation

## 2013-04-13 DIAGNOSIS — E119 Type 2 diabetes mellitus without complications: Secondary | ICD-10-CM | POA: Insufficient documentation

## 2013-04-13 DIAGNOSIS — Z7901 Long term (current) use of anticoagulants: Secondary | ICD-10-CM | POA: Insufficient documentation

## 2013-04-13 DIAGNOSIS — I4891 Unspecified atrial fibrillation: Secondary | ICD-10-CM | POA: Insufficient documentation

## 2013-04-13 DIAGNOSIS — I5030 Unspecified diastolic (congestive) heart failure: Secondary | ICD-10-CM | POA: Insufficient documentation

## 2013-04-13 LAB — CBC WITH DIFFERENTIAL/PLATELET
BASOS ABS: 0 10*3/uL (ref 0.0–0.1)
Basophils Relative: 0 % (ref 0–1)
EOS ABS: 0.1 10*3/uL (ref 0.0–0.7)
Eosinophils Relative: 2 % (ref 0–5)
HCT: 39.6 % (ref 36.0–46.0)
HEMOGLOBIN: 13.6 g/dL (ref 12.0–15.0)
Lymphocytes Relative: 52 % — ABNORMAL HIGH (ref 12–46)
Lymphs Abs: 3.4 10*3/uL (ref 0.7–4.0)
MCH: 31.3 pg (ref 26.0–34.0)
MCHC: 34.3 g/dL (ref 30.0–36.0)
MCV: 91.2 fL (ref 78.0–100.0)
MONOS PCT: 5 % (ref 3–12)
Monocytes Absolute: 0.4 10*3/uL (ref 0.1–1.0)
NEUTROS ABS: 2.6 10*3/uL (ref 1.7–7.7)
Neutrophils Relative %: 41 % — ABNORMAL LOW (ref 43–77)
PLATELETS: 260 10*3/uL (ref 150–400)
RBC: 4.34 MIL/uL (ref 3.87–5.11)
RDW: 13.7 % (ref 11.5–15.5)
WBC: 6.5 10*3/uL (ref 4.0–10.5)

## 2013-04-13 LAB — COMPREHENSIVE METABOLIC PANEL
ALK PHOS: 58 U/L (ref 39–117)
ALT: 15 U/L (ref 0–35)
AST: 14 U/L (ref 0–37)
Albumin: 3.9 g/dL (ref 3.5–5.2)
BILIRUBIN TOTAL: 0.4 mg/dL (ref 0.3–1.2)
BUN: 14 mg/dL (ref 6–23)
CHLORIDE: 103 meq/L (ref 96–112)
CO2: 19 mEq/L (ref 19–32)
Calcium: 9.2 mg/dL (ref 8.4–10.5)
Creatinine, Ser: 0.86 mg/dL (ref 0.50–1.10)
GFR calc Af Amer: 80 mL/min — ABNORMAL LOW (ref >90)
GFR calc non Af Amer: 69 mL/min — ABNORMAL LOW (ref >90)
Glucose, Bld: 170 mg/dL — ABNORMAL HIGH (ref 70–99)
POTASSIUM: 3.7 meq/L (ref 3.7–5.3)
SODIUM: 139 meq/L (ref 137–147)
Total Protein: 7.7 g/dL (ref 6.0–8.3)

## 2013-04-13 LAB — URINALYSIS, ROUTINE W REFLEX MICROSCOPIC
Bilirubin Urine: NEGATIVE
GLUCOSE, UA: NEGATIVE mg/dL
KETONES UR: NEGATIVE mg/dL
Nitrite: NEGATIVE
PH: 5.5 (ref 5.0–8.0)
PROTEIN: NEGATIVE mg/dL
Specific Gravity, Urine: 1.018 (ref 1.005–1.030)
Urobilinogen, UA: 0.2 mg/dL (ref 0.0–1.0)

## 2013-04-13 LAB — URINE MICROSCOPIC-ADD ON

## 2013-04-13 NOTE — ED Notes (Signed)
No swelling noted to legs at this time.

## 2013-04-13 NOTE — ED Notes (Addendum)
Here yesterday for bloating and legs swollen.  Now, having diarrhea; she contributes it to an antibiotic (monurol) for treatment of uti.

## 2013-04-13 NOTE — ED Provider Notes (Signed)
CSN: [id number]     Arrival date & time 04/13/13  2248 History   First MD Initiated Contact with Patient 04/13/13 2339     Chief Complaint  Patient presents with  . Diarrhea   (Consider location/radiation/quality/duration/timing/severity/associated sxs/prior Treatment) HPI This patient is a very pleasant 68 yo woman with multiple chronic medical problems who was TAR in this ED 2d ago and diagnosed with UTI. She has multiple antibiotic allergies and was started on an antibiotic for UTI.   The patient says that she has had about 24-30 hrs of diarrhea. She had 3 watery, brown, nonbloody stools yesterday and 6 today. The patient says she feels her abdomen is a bit bloated. Denies abdominal pain. No fever. Patient spoke with her PCPs nurse over the phone and was told to come to the ED.   Past Medical History  Diagnosis Date  . CAD (coronary artery disease)   . PAF (paroxysmal atrial fibrillation)   . Hypertension   . Hyperlipidemia   . Hypokalemia   . Diabetes mellitus   . Depression   . Lumbar back pain   . LBBB (left bundle branch block)   . Headache(784.0)   . Lymphadenitis   . Arthritis   . Bursitis   . Chronic anticoagulation     on coumadin  . Heart palpitations   . Diastolic dysfunction     per echo in October 2012 with EF 50 to 55%  . 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 GERD (gastroesophageal reflux disease) 10/23/2003  . Fibromyalgia   . Blood transfusion without reported diagnosis    Past Surgical History  Procedure Laterality Date  . Angioplasty  laminectomy  . Lumbar laminectomy      x3  . Coronary artery bypass graft      LIMA to LAD   . Abdominal hysterectomy    . Cholecystectomy    . Tonsillectomy    . Knee arthroscopy    . Sphincterotomy     Family History  Problem Relation Age of Onset  . Heart attack Father   . Heart disease Father   . Kidney disease    . Stroke    . Arthritis    . Hypertension    . Diabetes    . Stroke Mother   . Colon  cancer Paternal Uncle    History  Substance Use Topics  . Smoking status: Former Smoker    Quit date: 03/07/2001  . Smokeless tobacco: Never Used  . Alcohol Use: No   OB History   Grav Para Term Preterm Abortions TAB SAB Ect Mult Living                 Review of Systems Ten point review of symptoms performed and is negative with the exception of symptoms noted above.   Allergies  Metformin and related; Cleocin; Codeine; Doxycycline hyclate; Macrolides and ketolides; Morphine; Pentazocine lactate; Vibramycin; and Sulfonamide derivatives  Home Medications   Current Outpatient Rx  Name  Route  Sig  Dispense  Refill  . ALPRAZolam (XANAX) 1 MG tablet   Oral   Take 1 mg by mouth 3 (three) times daily as needed for anxiety.         Marland Kitchen atenolol (TENORMIN) 100 MG tablet   Oral   Take 100 mg by mouth every morning.         Marland Kitchen atorvastatin (LIPITOR) 20 MG tablet   Oral   Take 40 mg by mouth daily.         Marland Kitchen  clotrimazole-betamethasone (LOTRISONE) cream   Topical   Apply 1 application topically daily as needed (for itching).         . cyclobenzaprine (FLEXERIL) 10 MG tablet   Oral   Take 10 mg by mouth 2 (two) times daily as needed. For muscle spasms         . furosemide (LASIX) 20 MG tablet   Oral   Take 20-40 mg by mouth See admin instructions. Takes 20mg  daily, but can up addt'l 20mg  as needed         . gabapentin (NEURONTIN) 300 MG capsule   Oral   Take 300 mg by mouth 3 (three) times daily.         Marland Kitchen HYDROcodone-acetaminophen (NORCO) 10-325 MG per tablet   Oral   Take 1 tablet by mouth every 4 (four) hours as needed for pain.         Marland Kitchen insulin detemir (LEVEMIR) 100 UNIT/ML injection   Subcutaneous   Inject 50 Units into the skin 2 (two) times daily.         . Levomilnacipran HCl ER (FETZIMA) 40 MG CP24   Oral   Take 1 capsule by mouth daily.   30 capsule      . metFORMIN (GLUCOPHAGE-XR) 500 MG 24 hr tablet   Oral   Take 1,000 mg by mouth 2  (two) times daily.         . potassium chloride SA (K-DUR,KLOR-CON) 20 MEQ tablet   Oral   Take 20 mEq by mouth 2 (two) times daily.         Marland Kitchen spironolactone (ALDACTONE) 25 MG tablet   Oral   Take 25 mg by mouth 2 (two) times daily.         Marland Kitchen telmisartan (MICARDIS) 40 MG tablet   Oral   Take 40 mg by mouth daily.         Marland Kitchen warfarin (COUMADIN) 6 MG tablet   Oral   Take 3-6 mg by mouth daily. Takes 6mg  on mondays and thursdays Takes 3 mg all other days         . nitroGLYCERIN (NITROSTAT) 0.4 MG SL tablet   Sublingual   Place 1 tablet (0.4 mg total) under the tongue every 5 (five) minutes as needed. For chest pain   30 tablet   6    BP 139/70  Pulse 87  Temp(Src) 98 F (36.7 C) (Oral)  Resp 18  Ht 5\' 2"  (1.575 m)  Wt 237 lb (107.502 kg)  BMI 43.34 kg/m2  SpO2 96% Physical Exam Gen: well developed and well nourished appearing Head: NCAT Eyes: PERL, EOMI Nose: no epistaixis or rhinorrhea Mouth/throat: mucosa is moist and pink Neck: supple, no stridor Lungs: CTA B, no wheezing, rhonchi or rales CV: RRR, no murmur, extremities appear well perfused.  Abd: soft, obese, notender, nondistended Back: no ttp, no cva ttp Skin: warm and dry Ext: normal to inspection, trace and symmetric pretibial  edema Neuro: CN ii-xii grossly intact, no focal deficits Psyche; normal affect,  calm and cooperative.   ED Course  Procedures (including critical care time) Labs Review  Results for orders placed during the hospital encounter of 04/13/13 (from the past 24 hour(s))  URINALYSIS, ROUTINE W REFLEX MICROSCOPIC     Status: Abnormal   Collection Time    04/13/13 11:18 PM      Result Value Range   Color, Urine YELLOW  YELLOW   APPearance CLOUDY (*) CLEAR   Specific Gravity,  Urine 1.018  1.005 - 1.030   pH 5.5  5.0 - 8.0   Glucose, UA NEGATIVE  NEGATIVE mg/dL   Hgb urine dipstick TRACE (*) NEGATIVE   Bilirubin Urine NEGATIVE  NEGATIVE   Ketones, ur NEGATIVE  NEGATIVE  mg/dL   Protein, ur NEGATIVE  NEGATIVE mg/dL   Urobilinogen, UA 0.2  0.0 - 1.0 mg/dL   Nitrite NEGATIVE  NEGATIVE   Leukocytes, UA MODERATE (*) NEGATIVE  URINE MICROSCOPIC-ADD ON     Status: Abnormal   Collection Time    04/13/13 11:18 PM      Result Value Range   Squamous Epithelial / LPF MANY (*) RARE   WBC, UA 7-10  <3 WBC/hpf   RBC / HPF 0-2  <3 RBC/hpf   Bacteria, UA FEW (*) RARE   Urine-Other MUCOUS PRESENT    CBC WITH DIFFERENTIAL     Status: Abnormal   Collection Time    04/13/13 11:20 PM      Result Value Range   WBC 6.5  4.0 - 10.5 K/uL   RBC 4.34  3.87 - 5.11 MIL/uL   Hemoglobin 13.6  12.0 - 15.0 g/dL   HCT 39.6  36.0 - 46.0 %   MCV 91.2  78.0 - 100.0 fL   MCH 31.3  26.0 - 34.0 pg   MCHC 34.3  30.0 - 36.0 g/dL   RDW 13.7  11.5 - 15.5 %   Platelets 260  150 - 400 K/uL   Neutrophils Relative % 41 (*) 43 - 77 %   Neutro Abs 2.6  1.7 - 7.7 K/uL   Lymphocytes Relative 52 (*) 12 - 46 %   Lymphs Abs 3.4  0.7 - 4.0 K/uL   Monocytes Relative 5  3 - 12 %   Monocytes Absolute 0.4  0.1 - 1.0 K/uL   Eosinophils Relative 2  0 - 5 %   Eosinophils Absolute 0.1  0.0 - 0.7 K/uL   Basophils Relative 0  0 - 1 %   Basophils Absolute 0.0  0.0 - 0.1 K/uL  COMPREHENSIVE METABOLIC PANEL     Status: Abnormal   Collection Time    04/13/13 11:20 PM      Result Value Range   Sodium 139  137 - 147 mEq/L   Potassium 3.7  3.7 - 5.3 mEq/L   Chloride 103  96 - 112 mEq/L   CO2 19  19 - 32 mEq/L   Glucose, Bld 170 (*) 70 - 99 mg/dL   BUN 14  6 - 23 mg/dL   Creatinine, Ser 0.86  0.50 - 1.10 mg/dL   Calcium 9.2  8.4 - 10.5 mg/dL   Total Protein 7.7  6.0 - 8.3 g/dL   Albumin 3.9  3.5 - 5.2 g/dL   AST 14  0 - 37 U/L   ALT 15  0 - 35 U/L   Alkaline Phosphatase 58  39 - 117 U/L   Total Bilirubin 0.4  0.3 - 1.2 mg/dL   GFR calc non Af Amer 69 (*) >90 mL/min   GFR calc Af Amer 80 (*) >90 mL/min   Imaging Review Dg Chest 2 View  04/12/2013   CLINICAL DATA:  Chest pain  EXAM: CHEST  2 VIEW   COMPARISON:  Prior radiograph from 12/25/2012  FINDINGS: Median sternotomy wires remain intact.  Mild cardiomegaly is stable.  Lungs are mildly hypoinflated. There is mild diffuse prominence of the interstitial markings, suggesting mild pulmonary interstitial edema. No definite pleural  effusion. No focal infiltrate. No pneumothorax.  Osseous structures and soft tissues are unchanged.  IMPRESSION: Stable cardiomegaly with mild diffuse prominence of the interstitial markings, suggesting mild pulmonary interstitial edema.   Electronically Signed   By: Jeannine Boga M.D.   On: 04/12/2013 03:17    MDM  Patient with diarrhea - likely secondary to Fosfomycin which patient received a one time dose of yesterday for UTI.  Noted to be an adverse reaction in 10% of patients. Will prescribe Lomotil. Patient counseled to return for red flag sx and to f/u with Dr. Arnoldo Morale as needed.    Elyn Peers, MD 04/14/13 0010

## 2013-04-14 LAB — URINE CULTURE

## 2013-04-14 MED ORDER — LOPERAMIDE HCL 2 MG PO CAPS: 2.0000 mg | ORAL_CAPSULE | ORAL | Status: DC | PRN

## 2013-04-15 ENCOUNTER — Telehealth (HOSPITAL_BASED_OUTPATIENT_CLINIC_OR_DEPARTMENT_OTHER): Payer: Self-pay

## 2013-04-15 LAB — URINE CULTURE

## 2013-04-15 NOTE — Telephone Encounter (Signed)
Post ED Visit - Positive Culture Follow-up  Culture report reviewed by antimicrobial stewardship pharmacist: []  Wes Paxton, Pharm.D., BCPS []  Heide Guile, Pharm.D., BCPS []  Alycia Rossetti, Pharm.D., BCPS [x]  [place], Pharm.D., BCPS, AAHIVP []  Legrand Como, Pharm.D., BCPS, AAHIVP  Positive Urine culture Treated with Fosfomycin, organism sensitive to the same and no further patient follow-up is required at this time.  Ileene Musa 04/15/2013, 10:58 AM

## 2013-04-16 ENCOUNTER — Encounter: Payer: Self-pay | Admitting: Internal Medicine

## 2013-04-18 ENCOUNTER — Other Ambulatory Visit: Payer: Self-pay | Admitting: *Deleted

## 2013-04-18 DIAGNOSIS — R635 Abnormal weight gain: Secondary | ICD-10-CM

## 2013-04-18 DIAGNOSIS — R0602 Shortness of breath: Secondary | ICD-10-CM

## 2013-04-19 ENCOUNTER — Encounter: Payer: Self-pay | Admitting: Family

## 2013-04-19 ENCOUNTER — Ambulatory Visit (INDEPENDENT_AMBULATORY_CARE_PROVIDER_SITE_OTHER): Payer: Medicare Other | Admitting: Family

## 2013-04-19 VITALS — BP 142/90 | HR 94 | Wt 240.0 lb

## 2013-04-19 DIAGNOSIS — R609 Edema, unspecified: Secondary | ICD-10-CM

## 2013-04-19 DIAGNOSIS — N390 Urinary tract infection, site not specified: Secondary | ICD-10-CM

## 2013-04-19 DIAGNOSIS — E669 Obesity, unspecified: Secondary | ICD-10-CM

## 2013-04-19 LAB — POCT URINALYSIS DIPSTICK
Bilirubin, UA: NEGATIVE
Blood, UA: NEGATIVE
Glucose, UA: NEGATIVE
Ketones, UA: NEGATIVE
Nitrite, UA: NEGATIVE
Protein, UA: NEGATIVE
Spec Grav, UA: 1.015
Urobilinogen, UA: 0.2
pH, UA: 7

## 2013-04-19 NOTE — Progress Notes (Signed)
Pre visit review using our clinic review tool, if applicable. No additional management support is needed unless otherwise documented below in the visit note. 

## 2013-04-19 NOTE — Patient Instructions (Signed)

## 2013-04-19 NOTE — Progress Notes (Signed)
Subjective:    Patient ID: Kristen [name], female    DOB: Aug 02, 1945, 68 y.o.   MRN: [id number]  HPI 68 year old African American female, patient of Dr. Arnoldo Morale is in today as a hospital followup from 04/12/2013. She presented with multiple complaints that include abdominal distention, incomplete emptying of the bladder, urinary frequency, peripheral edema, and shortness of breath with exertion. She had a chest x-ray done that showed mild pulmonary edema. She had a urine that was positive for UTI. Subsequently, she was given Fosfomycinby mouth in the emergency department and sent home. On 04/13/2013, she returned to the emergency department with diarrhea. She given a prescription for Lomotil. The diarrhea was thought to be caused by antibiotic. Today, she is much better. Continues to have concerns of pitting edema. However, has Lasix at home and Dr. Arnoldo Morale is advised her to take as needed for pitting edema.    Review of Systems  Constitutional: Negative.   HENT: Negative.   Respiratory: Negative.  Negative for shortness of breath.   Cardiovascular: Positive for leg swelling. Negative for chest pain.  Gastrointestinal: Positive for diarrhea. Negative for nausea and vomiting.  Endocrine: Negative.   Genitourinary: Negative.   Musculoskeletal: Negative.   Skin: Negative.   Neurological: Negative.   Psychiatric/Behavioral: Negative.    Past Medical History  Diagnosis Date  . CAD (coronary artery disease)   . PAF (paroxysmal atrial fibrillation)   . Hypertension   . Hyperlipidemia   . Hypokalemia   . Diabetes mellitus   . Depression   . Lumbar back pain   . LBBB (left bundle branch block)   . Headache(784.0)   . Lymphadenitis   . Arthritis   . Bursitis   . Chronic anticoagulation     on coumadin  . Heart palpitations   . Diastolic dysfunction     per echo in October 2012 with EF 50 to 55%  . 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 GERD (gastroesophageal reflux  disease) 10/23/2003  . Fibromyalgia   . Blood transfusion without reported diagnosis     History   Social History  . Marital Status: Legally Separated    Spouse Name: N/A    Number of Children: N/A  . Years of Education: N/A   Occupational History  . Not on file.   Social History Main Topics  . Smoking status: Former Smoker    Quit date: 03/07/2001  . Smokeless tobacco: Never Used  . Alcohol Use: No  . Drug Use: No  . Sexual Activity: Not on file   Other Topics Concern  . Not on file   Social History Narrative   Lives locally, has help available if needed.    Past Surgical History  Procedure Laterality Date  . Angioplasty  laminectomy  . Lumbar laminectomy      x3  . Coronary artery bypass graft      LIMA to LAD   . Abdominal hysterectomy    . Cholecystectomy    . Tonsillectomy    . Knee arthroscopy    . Sphincterotomy      Family History  Problem Relation Age of Onset  . Heart attack Father   . Heart disease Father   . Kidney disease    . Stroke    . Arthritis    . Hypertension    . Diabetes    . Stroke Mother   . Colon cancer Paternal Uncle     Allergies  Allergen Reactions  .  Metformin And Related Other (See Comments)    Must take XR form only, cannot tolerate Regular release metformin  . Cleocin [Clindamycin Hcl] Diarrhea  . Codeine Itching  . Doxycycline Hyclate Diarrhea  . Macrolides And Ketolides Diarrhea  . Morphine Hives  . Pentazocine Lactate Itching and Nausea And Vomiting  . Vibramycin [Doxycycline Calcium] Diarrhea and Rash  . Sulfonamide Derivatives Rash    Current Outpatient Prescriptions on File Prior to Visit  Medication Sig Dispense Refill  . ALPRAZolam (XANAX) 1 MG tablet Take 1 mg by mouth 3 (three) times daily as needed for anxiety.      Marland Kitchen atenolol (TENORMIN) 100 MG tablet Take 100 mg by mouth every morning.      Marland Kitchen atorvastatin (LIPITOR) 20 MG tablet Take 40 mg by mouth daily.      . clotrimazole-betamethasone  (LOTRISONE) cream Apply 1 application topically daily as needed (for itching).      . cyclobenzaprine (FLEXERIL) 10 MG tablet Take 10 mg by mouth 2 (two) times daily as needed. For muscle spasms      . furosemide (LASIX) 20 MG tablet Take 20-40 mg by mouth See admin instructions. Takes 20mg  daily, but can up addt'l 20mg  as needed      . gabapentin (NEURONTIN) 300 MG capsule Take 300 mg by mouth 3 (three) times daily.      Marland Kitchen HYDROcodone-acetaminophen (NORCO) 10-325 MG per tablet Take 1 tablet by mouth every 4 (four) hours as needed for pain.      Marland Kitchen insulin detemir (LEVEMIR) 100 UNIT/ML injection Inject 50 Units into the skin 2 (two) times daily.      . Levomilnacipran HCl ER (FETZIMA) 40 MG CP24 Take 1 capsule by mouth daily.  30 capsule    . loperamide (IMODIUM) 2 MG capsule Take 1 capsule (2 mg total) by mouth as needed for diarrhea or loose stools.  30 capsule  0  . metFORMIN (GLUCOPHAGE-XR) 500 MG 24 hr tablet Take 1,000 mg by mouth 2 (two) times daily.      . nitroGLYCERIN (NITROSTAT) 0.4 MG SL tablet Place 1 tablet (0.4 mg total) under the tongue every 5 (five) minutes as needed. For chest pain  30 tablet  6  . potassium chloride SA (K-DUR,KLOR-CON) 20 MEQ tablet Take 20 mEq by mouth 2 (two) times daily.      Marland Kitchen spironolactone (ALDACTONE) 25 MG tablet Take 25 mg by mouth 2 (two) times daily.      Marland Kitchen telmisartan (MICARDIS) 40 MG tablet Take 40 mg by mouth daily.      Marland Kitchen warfarin (COUMADIN) 6 MG tablet Take 3-6 mg by mouth daily. Takes 6mg  on mondays and thursdays Takes 3 mg all other days       No current facility-administered medications on file prior to visit.    BP 142/90  Pulse 94  Wt 240 lb (108.863 kg)chart    Objective:   Physical Exam  Constitutional: She is oriented to person, place, and time. She appears well-developed and well-nourished.  HENT:  Right Ear: External ear normal.  Left Ear: External ear normal.  Nose: Nose normal.  Mouth/Throat: Oropharynx is clear and moist.    Neck: Normal range of motion. Neck supple.  Cardiovascular: Normal rate, regular rhythm and normal heart sounds.   Pulmonary/Chest: Effort normal and breath sounds normal. She has no wheezes.  Abdominal: Soft. Bowel sounds are normal.  Musculoskeletal: Normal range of motion. She exhibits edema.  1+pitting edema bilaterally.   Neurological: She is  alert and oriented to person, place, and time.  Skin: Skin is warm and dry.  Psychiatric: She has a normal mood and affect.          Assessment & Plan:  Kristen [name] was seen today for follow-up.  Diagnoses and associated orders for this visit:  UTI (urinary tract infection) - POC Urinalysis Dipstick  Peripheral edema    Lasix as needed for peripheral edema. He should have a potassium. UA is obtained today. Call the office with any questions or concerns. Recheck as scheduled with Dr. Arnoldo Morale in sooner if needed.

## 2013-04-20 ENCOUNTER — Other Ambulatory Visit: Payer: Self-pay | Admitting: Internal Medicine

## 2013-04-24 ENCOUNTER — Encounter: Payer: Self-pay | Admitting: Internal Medicine

## 2013-04-26 ENCOUNTER — Telehealth (HOSPITAL_COMMUNITY): Payer: Self-pay | Admitting: *Deleted

## 2013-04-26 ENCOUNTER — Other Ambulatory Visit: Payer: Self-pay | Admitting: *Deleted

## 2013-04-26 MED ORDER — HYDROCODONE-ACETAMINOPHEN 10-325 MG PO TABS: 1.0000 | ORAL_TABLET | ORAL | Status: DC | PRN

## 2013-05-03 ENCOUNTER — Ambulatory Visit: Payer: Medicare Other | Admitting: Internal Medicine

## 2013-05-03 ENCOUNTER — Encounter: Payer: Self-pay | Admitting: Internal Medicine

## 2013-05-03 ENCOUNTER — Other Ambulatory Visit: Payer: Self-pay | Admitting: *Deleted

## 2013-05-03 ENCOUNTER — Ambulatory Visit (INDEPENDENT_AMBULATORY_CARE_PROVIDER_SITE_OTHER): Payer: Medicare Other | Admitting: General Practice

## 2013-05-03 ENCOUNTER — Ambulatory Visit (INDEPENDENT_AMBULATORY_CARE_PROVIDER_SITE_OTHER): Payer: Medicare Other | Admitting: Internal Medicine

## 2013-05-03 VITALS — BP 114/68 | HR 68 | Temp 98.1°F | Resp 12 | Wt 234.0 lb

## 2013-05-03 DIAGNOSIS — Z0000 Encounter for general adult medical examination without abnormal findings: Secondary | ICD-10-CM | POA: Insufficient documentation

## 2013-05-03 DIAGNOSIS — E1151 Type 2 diabetes mellitus with diabetic peripheral angiopathy without gangrene: Secondary | ICD-10-CM

## 2013-05-03 DIAGNOSIS — E1159 Type 2 diabetes mellitus with other circulatory complications: Secondary | ICD-10-CM

## 2013-05-03 DIAGNOSIS — Z7901 Long term (current) use of anticoagulants: Secondary | ICD-10-CM

## 2013-05-03 DIAGNOSIS — Z5181 Encounter for therapeutic drug level monitoring: Secondary | ICD-10-CM

## 2013-05-03 DIAGNOSIS — I4891 Unspecified atrial fibrillation: Secondary | ICD-10-CM

## 2013-05-03 LAB — POCT INR: INR: 2.2

## 2013-05-03 LAB — HEMOGLOBIN A1C: HEMOGLOBIN A1C: 8.7 % — AB (ref 4.6–6.5)

## 2013-05-03 MED ORDER — LIRAGLUTIDE 18 MG/3ML ~~LOC~~ SOPN: PEN_INJECTOR | SUBCUTANEOUS | Status: DC

## 2013-05-03 NOTE — Progress Notes (Signed)
Patient ID: Kristen [name], female   DOB: 1945-07-02, 68 y.o.   MRN: [id number]  HPI: Kristen [name] is a 68 y.o.-year-old female, returning for DM2, dx 2008, insulin-dependent, uncontrolled, with complications (CAD, PAD, h/o CVD, peripheral neuropathy). Last visit 4 mo ago.  Since last visit, she had a UTI >> tx with ABx, and now nasal congestion.  Last hemoglobin A1c was: Lab Results  Component Value Date   HGBA1C 7.2* 11/14/2012   HGBA1C 7.6* 08/30/2012   HGBA1C 8.1* 05/07/2012    Pt is on a regimen of: - Levemir pen 50 << 60 units bid - she is not affording Levemir and gets samples from PCP, but this will be solved soon as she gets the med directly from [facility] - Metformin XR 1000 mg bid - tolerates it well She was on Bydureon in the past, then also on Novolog 60-70-75.  She tried Januvia before.   Pt checks her sugars 2-3x a day (no log) and they were high, but getting better as she started to change her diet ~ 2 weeks ago: - am: 90-115 >> 89-131 >> 90-100 >> 150-170 - before lunch: 120-140 >> 82-125 >> 79 >> n/c - before dinner: 99-168 >> n/c - after dinner: 120-130 >> 250-300s, but now 180s - bedtime: 110-120 >> 91-123, once 211 >> 90-100 >> n/c Had some lows in the 90s;  she has hypoglycemia awareness at 100.  She was eating a lot of sweets, now started to improve. Started cardiac rehab this week. .  Pt does not have chronic kidney disease, last BUN/creatinine was:  Lab Results  Component Value Date   BUN 14 04/13/2013   CREATININE 0.86 04/13/2013   Last set of lipids: Lab Results  Component Value Date   CHOL 119 12/07/2012   HDL 36* 12/07/2012   LDLCALC 60 12/07/2012   LDLDIRECT 116.0 11/28/2008   TRIG 115 12/07/2012   CHOLHDL 3.3 12/07/2012   Pt's last eye exam was in last year. Reportingly no DR.  Denies numbness and tingling in her legs.  She had a stress test on 11/14/2012 b/c palpitations and tachycardia (on Holter monitor) >> cath on 11/30/2012.   I  reviewed her chart and she also has a history of hypertension, paroxysmal A. Fib-on anticoagulation, depression, osteoarthritis - status post arthroscopic knee surgeries x2, status post lumbar laminectomy x3, hyperlipidemia, morbid obesity, chronic lymphadenitis, GERD, hypothyroidism, headaches, fibromyalgia.  ROS: Constitutional: + fatigue, + feeling hot, + hot flashes, + poor sleep Eyes: + blurry vision, no xerophthalmia ENT: no sore throat, no nodules palpated in throat, no dysphagia/odynophagia, no hoarseness Cardiovascular: no CP/palpitations/no leg swelling Respiratory: + cough/+ SOB with activity  Gastrointestinal: no vomiting diarrhea or constipation, but has nausea Musculoskeletal: + muscle aches/+ joint aches Skin: no rashes, + hair thinning Neurological: no tremors/numbness/tingling/dizziness, + headaches  I reviewed pt's medications, allergies, PMH, social hx, family hx and no changes required, except as mentioned above, and stopping metoprolol, digoxin, aspirin, Keflex, and starting atenolol 100 mg daily. PE: BP 114/68  Pulse 68  Temp(Src) 98.1 F (36.7 C) (Oral)  Resp 12  Wt 234 lb (106.142 kg)  SpO2 97% Wt Readings from Last 3 Encounters:  05/03/13 234 lb (106.142 kg)  04/19/13 240 lb (108.863 kg)  04/13/13 237 lb (107.502 kg)   Constitutional: overweight, in NAD, full supraclavicular fat pads Eyes: PERRLA, EOMI, no exophthalmos ENT: moist mucous membranes, no thyromegaly, no cervical lymphadenopathy Cardiovascular: RRR, No MRG Respiratory: CTA B Gastrointestinal: abdomen soft, NT, ND, BS+  Musculoskeletal: no deformities, strength intact in all 4 Skin: moist, warm, acanthosis nigricans  ASSESSMENT: 1. DM2, insulin-dependent, uncontrolled, with complications - CAD- Echo 12/2010: EF 50-55%, status post CABG 2004: LIMA to LAD - cards: Dr. Lia Foyer. Also has a LBBB.  - PAD - h/o stroke - peripheral neuropathy - on neurontin  PLAN:  1. Patient with a six-year  history of diabetes, on high-dose of basal insulin. She did very well with decreasing her Levemir dose and adding Metformin, but now sugars worse mostly due to dietary indiscretions in last 3 mo. 03-31-2022 is a difficult month for her as her grandson of 55 y/o died of sarcoma in 03-31-2009.  - I advised her to Patient Instructions  Please continue Levemir 50 units 2x a day. Continue Metformin 1000 mg 2x a day. Start Victoza 0.6 mg daily in am, and advance to 1.2 mg after a week, then advance to 1.8 mg daily.  Please return in 1.5 month with your sugar log.   Please stop at the lab.  - check HbA1C  - RTC in 1.5 mo    Office Visit on 05/03/2013  Component Date Value Ref Range Status  . Hemoglobin A1C 05/03/2013 8.7* 4.6 - 6.5 % Final   Glycemic Control Guidelines for People with Diabetes:Non Diabetic:  <6%Goal of Therapy: <7%Additional Action Suggested:  >8%    HbA1c worse >> see plan above.

## 2013-05-03 NOTE — Progress Notes (Signed)
Pre visit review using our clinic review tool, if applicable. No additional management support is needed unless otherwise documented below in the visit note. 

## 2013-05-03 NOTE — Patient Instructions (Signed)
Please continue Levemir 50 units 2x a day. Continue Metformin 1000 mg 2x a day. Start Victoza 0.6 mg daily in am, and advance to 1.2 mg after a week, then advance to 1.8 mg daily.  Please return in 1.5 month with your sugar log.   Please stop at the lab.

## 2013-05-06 ENCOUNTER — Encounter (HOSPITAL_COMMUNITY): Payer: Self-pay

## 2013-05-08 ENCOUNTER — Encounter (HOSPITAL_COMMUNITY)
Admission: RE | Admit: 2013-05-08 | Discharge: 2013-05-08 | Disposition: A | Discharge status: Home / Self Care | Payer: Self-pay | Source: Ambulatory Visit | Attending: Cardiovascular Disease | Admitting: Cardiovascular Disease

## 2013-05-08 DIAGNOSIS — I2510 Atherosclerotic heart disease of native coronary artery without angina pectoris: Secondary | ICD-10-CM | POA: Insufficient documentation

## 2013-05-08 DIAGNOSIS — I10 Essential (primary) hypertension: Secondary | ICD-10-CM | POA: Insufficient documentation

## 2013-05-08 DIAGNOSIS — Z5189 Encounter for other specified aftercare: Secondary | ICD-10-CM | POA: Insufficient documentation

## 2013-05-08 DIAGNOSIS — E785 Hyperlipidemia, unspecified: Secondary | ICD-10-CM | POA: Insufficient documentation

## 2013-05-10 ENCOUNTER — Encounter (HOSPITAL_COMMUNITY)
Admission: RE | Admit: 2013-05-10 | Discharge: 2013-05-10 | Disposition: A | Discharge status: Home / Self Care | Payer: Self-pay | Source: Ambulatory Visit | Attending: Cardiovascular Disease | Admitting: Cardiovascular Disease

## 2013-05-12 ENCOUNTER — Encounter: Payer: Self-pay | Admitting: Internal Medicine

## 2013-05-13 ENCOUNTER — Encounter (HOSPITAL_COMMUNITY)
Admission: RE | Admit: 2013-05-13 | Discharge: 2013-05-13 | Disposition: A | Discharge status: Home / Self Care | Payer: Self-pay | Source: Ambulatory Visit | Attending: Cardiovascular Disease | Admitting: Cardiovascular Disease

## 2013-05-13 NOTE — Progress Notes (Signed)
Patient arrived to exercise today and CBG was 99.  Patient was not allowed to exercise.  Discussed that CBG needs to be greater than 110.

## 2013-05-14 ENCOUNTER — Telehealth: Payer: Self-pay | Admitting: Internal Medicine

## 2013-05-14 ENCOUNTER — Telehealth: Payer: Self-pay | Admitting: *Deleted

## 2013-05-14 NOTE — Telephone Encounter (Signed)
Returned pt's call. Pt states she is up to 1.8 on the Victoza now. She has started feeling bad. She states she is feeling weak and needs to lie down. She said her bg was 57 yesterday afternoon. Today it was in the 80's this AM 108 before lunch and 116 after. Pt states she peaks around 5:30-6:00 pm and her bg is around 159. She feels like all in all her blood sugars are better. She wants to know if Dr Cruzita Lederer thinks she should just give her body time to adjust to the medication or something else? Please advise.

## 2013-05-14 NOTE — Telephone Encounter (Signed)
Call regarding Victoza - she got very sick with it because she was taking it wrong. She is now taking it correctly but it is not making her feel better. She needs to get some clarity regarding dosage.

## 2013-05-14 NOTE — Telephone Encounter (Signed)
Kristen [name], please tell her to try to dial few clicks back (between 1.2 and 1.8 mg) and stay on the next dose that she can tolerate well.

## 2013-05-15 ENCOUNTER — Encounter (HOSPITAL_COMMUNITY): Payer: Self-pay

## 2013-05-15 NOTE — Telephone Encounter (Signed)
Called pt and lvm advising her to try to dial a few clicks back (between 1.2 and 1.8 mg) and stay on the next dose that she can tolerate well. Advised pt to keep up with her sugar readings and call in a few days to let us know how she is doing. Also advised pt if she does not understand to call our office.

## 2013-05-16 ENCOUNTER — Telehealth: Payer: Self-pay | Admitting: Internal Medicine

## 2013-05-16 ENCOUNTER — Telehealth: Payer: Self-pay | Admitting: *Deleted

## 2013-05-16 NOTE — Telephone Encounter (Signed)
Patient Information:  Caller Name: [place]  Phone: [phone]  Patient: [name]  Gender: Female  DOB: April 13, 1945  Age: 68 Years  PCP: Benay Pillow (Adults only)  Office Follow Up:  Does the office need to follow up with this patient?: No  Instructions For The Office: N/A  RN Note:  Pt declined offered appt for today and scheduler contacted for requested appt on 3/13.  Pt advised to call back with any changes in condition.  Symptoms  Reason For Call & Symptoms: Onset 3/11 pain on right side of abd.  Increased with deep breath and movement.  Pt states that she is [facility] when she is walking.  No vomiting, No diarrhea.  Afebrile.  Recent UTI, no urinary sxs at this time.  Pt states that she has developed Pulmonary Edema.   Not abd pain, more like bloating.  Reviewed Health History In EMR: Yes  Reviewed Medications In EMR: Yes  Reviewed Allergies In EMR: Yes  Reviewed Surgeries / Procedures: Yes  Date of Onset of Symptoms: 05/15/2013  Guideline(s) Used:  Abdominal Pain - Female  Disposition Per Guideline:   See Today in Office  Reason For Disposition Reached:   Age > 60 years  Advice Given:  Call Back If:  You become worse.  Patient Refused Recommendation:  Patient Will Make Own Appointment  On line with scheduler to make appt for 3/13

## 2013-05-16 NOTE — Telephone Encounter (Signed)
appt with Dr Maudie Mercury today

## 2013-05-16 NOTE — Telephone Encounter (Signed)
Returned pt's call. She told me that she is only doing 0.6 mg of the Victoza. She moved up to quickly to the 1.2 mg. She went back down to the 0.6 and is concerned about moving up because her bg has been low. This morning it was 88 at 6:30 am. She ate a few prunes because she was concerned it was low. She ate only yogurt for breakfast. Her bg was 79 and 77 after that. For lunch she had grilled salmon, some collards and baked sweet potato. She did not eat it all. Pt ok right now, but is concerned her numbers will stay low. Please advise.

## 2013-05-16 NOTE — Telephone Encounter (Signed)
Called pt and advised her that ok to stay on 0.6 mg, if her sugars are good at that dose. Pt understood and stated she would continue to log her numbers and let us know if she has lows again.

## 2013-05-16 NOTE — Telephone Encounter (Signed)
She can stay on 0.6 mg, if sugars good at that dose.

## 2013-05-17 ENCOUNTER — Encounter (HOSPITAL_COMMUNITY): Payer: Self-pay

## 2013-05-17 ENCOUNTER — Ambulatory Visit (INDEPENDENT_AMBULATORY_CARE_PROVIDER_SITE_OTHER): Payer: Medicare Other | Admitting: Family Medicine

## 2013-05-17 ENCOUNTER — Encounter: Payer: Self-pay | Admitting: Family Medicine

## 2013-05-17 VITALS — BP 120/74 | Temp 98.2°F | Wt 232.0 lb

## 2013-05-17 DIAGNOSIS — M549 Dorsalgia, unspecified: Secondary | ICD-10-CM

## 2013-05-17 DIAGNOSIS — R3915 Urgency of urination: Secondary | ICD-10-CM

## 2013-05-17 LAB — POCT URINALYSIS DIPSTICK
BILIRUBIN UA: NEGATIVE
Blood, UA: NEGATIVE
Glucose, UA: NEGATIVE
KETONES UA: NEGATIVE
Nitrite, UA: NEGATIVE
PROTEIN UA: NEGATIVE
SPEC GRAV UA: 1.02
Urobilinogen, UA: 1
pH, UA: 5.5

## 2013-05-17 MED ORDER — CYCLOBENZAPRINE HCL 7.5 MG PO TABS: 7.5000 mg | ORAL_TABLET | Freq: Three times a day (TID) | ORAL | Status: DC | PRN

## 2013-05-17 NOTE — Progress Notes (Signed)
No chief complaint on file.   HPI:  Kristen [name] is a 68 yo F pt of Dr. Arnoldo Morale with [facility] here for an acute visit for back pain: -started: about 1 week ago - recently started exercises at cardiac rehab -described as: discomfort in R lower back, mild-mod, intermittent, worse with certain movements, better with rest - long hx of back problems followed by Dr. Arnoldo Morale -other symptoms: urinary frequency and urgency mild but wants to check urine -denies: fevers, vomiting, diarrhea, blood in urine or stools, change in bowels, constipation, weakness or numbness -hx of chole, hysterectomy and colonoscopy last year without diverticulosis -has pulm HTN and seeing cardiologist about this and has appt coming  ROS: See pertinent positives and negatives per HPI.  Past Medical History  Diagnosis Date  . CAD (coronary artery disease)   . PAF (paroxysmal atrial fibrillation)   . Hypertension   . Hyperlipidemia   . Hypokalemia   . Diabetes mellitus   . Depression   . Lumbar back pain   . LBBB (left bundle branch block)   . Headache(784.0)   . Lymphadenitis   . Arthritis   . Bursitis   . Chronic anticoagulation     on coumadin  . Heart palpitations   . Diastolic dysfunction     per echo in October 2012 with EF 50 to 55%  . 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 GERD (gastroesophageal reflux disease) 10/23/2003  . Fibromyalgia   . Blood transfusion without reported diagnosis     Past Surgical History  Procedure Laterality Date  . Angioplasty  laminectomy  . Lumbar laminectomy      x3  . Coronary artery bypass graft      LIMA to LAD   . Abdominal hysterectomy    . Cholecystectomy    . Tonsillectomy    . Knee arthroscopy    . Sphincterotomy      Family History  Problem Relation Age of Onset  . Heart attack Father   . Heart disease Father   . Kidney disease    . Stroke    . Arthritis    . Hypertension    . Diabetes    . Stroke Mother   . Colon cancer Paternal Uncle      History   Social History  . Marital Status: Legally Separated    Spouse Name: N/A    Number of Children: N/A  . Years of Education: N/A   Social History Main Topics  . Smoking status: Former Smoker    Quit date: 03/07/2001  . Smokeless tobacco: Never Used  . Alcohol Use: No  . Drug Use: No  . Sexual Activity: None   Other Topics Concern  . None   Social History Narrative   Lives locally, has help available if needed.    Current outpatient prescriptions:ALPRAZolam (XANAX) 1 MG tablet, Take 1 mg by mouth 3 (three) times daily as needed for anxiety., Disp: , Rfl: ;  atenolol (TENORMIN) 100 MG tablet, Take 100 mg by mouth every morning., Disp: , Rfl: ;  atorvastatin (LIPITOR) 20 MG tablet, Take 40 mg by mouth daily., Disp: , Rfl: ;  clotrimazole-betamethasone (LOTRISONE) cream, Apply 1 application topically daily as needed (for itching)., Disp: , Rfl:  furosemide (LASIX) 20 MG tablet, Take 20-40 mg by mouth See admin instructions. Takes 20mg  daily, but can up addt'l 20mg  as needed, Disp: , Rfl: ;  gabapentin (NEURONTIN) 300 MG capsule, TAKE ONE CAPSULE BY MOUTH  THREE TIMES DAILY, Disp: 90 capsule, Rfl: 0;  HYDROcodone-acetaminophen (NORCO) 10-325 MG per tablet, Take 1 tablet by mouth every 4 (four) hours as needed., Disp: 150 tablet, Rfl: 0 insulin detemir (LEVEMIR) 100 UNIT/ML injection, Inject 50 Units into the skin 2 (two) times daily., Disp: , Rfl: ;  Levomilnacipran HCl ER (FETZIMA) 40 MG CP24, Take 1 capsule by mouth daily., Disp: 30 capsule, Rfl: ;  Liraglutide 18 MG/3ML SOPN, Inject under skin at 1.8 mg daily., Disp: 9 mL, Rfl: 3;  loperamide (IMODIUM) 2 MG capsule, Take 1 capsule (2 mg total) by mouth as needed for diarrhea or loose stools., Disp: 30 capsule, Rfl: 0 metFORMIN (GLUCOPHAGE-XR) 500 MG 24 hr tablet, Take 1,000 mg by mouth 2 (two) times daily., Disp: , Rfl: ;  nitroGLYCERIN (NITROSTAT) 0.4 MG SL tablet, Place 1 tablet (0.4 mg total) under the tongue every 5 (five)  minutes as needed. For chest pain, Disp: 30 tablet, Rfl: 6;  potassium chloride SA (K-DUR,KLOR-CON) 20 MEQ tablet, Take 20 mEq by mouth 2 (two) times daily., Disp: , Rfl:  spironolactone (ALDACTONE) 25 MG tablet, Take 25 mg by mouth 2 (two) times daily., Disp: , Rfl: ;  telmisartan (MICARDIS) 40 MG tablet, Take 40 mg by mouth daily., Disp: , Rfl: ;  warfarin (COUMADIN) 6 MG tablet, Take 3-6 mg by mouth daily. Takes 6mg  on mondays and thursdays Takes 3 mg all other days, Disp: , Rfl:  cyclobenzaprine (FEXMID) 7.5 MG tablet, Take 1 tablet (7.5 mg total) by mouth 3 (three) times daily as needed for muscle spasms., Disp: 15 tablet, Rfl: 0  EXAM:  Filed Vitals:   05/17/13 0929  BP: 120/74  Temp: 98.2 F (36.8 C)    Body mass index is 42.42 kg/(m^2).  GENERAL: vitals reviewed and listed above, alert, oriented, appears well hydrated and in no acute distress  HEENT: atraumatic, conjunttiva clear, no obvious abnormalities on inspection of external nose and ears  NECK: no obvious masses on inspection  LUNGS: clear to auscultation bilaterally, no wheezes, rales or rhonchi, good air movement  CV: HRRR, no peripheral edema  ABD: BS+, soft, NTTP, no CVA TTP  MS: moves all extremities without noticeable abnormality TTP in quad lumb muscle on R, normal gait  PSYCH: pleasant and cooperative, no obvious depression or anxiety  ASSESSMENT AND PLAN:  Discussed the following assessment and plan:  Back pain - Plan: cyclobenzaprine (FEXMID) 7.5 MG tablet  Urinary urgency - Plan: POCT urinalysis dipstick, Culture, Urine  -we discussed possible serious and likely etiologies, workup and treatment, treatment risks and return precautions -suspect musculoskeletal -after this discussion, Kristen [name] opted for conservative tx of ms pain with follow up with Dr. Nelva Bush or PCP as needed -udip and culture pending to exclude uti -of course, we advised Kristen [name]  to return or notify a doctor immediately if symptoms  worsen or persist or new concerns arise.  -Patient advised to return or notify a doctor immediately if symptoms worsen or persist or new concerns arise.  Patient Instructions  -heat for 15 minutes twice daily  -continue rehab exercises  -flexeril as needed per instructions  -follow up with your back doctor or Dr. Arnoldo Morale as needed     Lucretia Kern.

## 2013-05-17 NOTE — Patient Instructions (Signed)
-  heat for 15 minutes twice daily  -continue rehab exercises  -flexeril as needed per instructions  -follow up with your back doctor or Dr. Arnoldo Morale as needed

## 2013-05-17 NOTE — Progress Notes (Signed)
Pre visit review using our clinic review tool, if applicable. No additional management support is needed unless otherwise documented below in the visit note. 

## 2013-05-19 LAB — URINE CULTURE

## 2013-05-20 ENCOUNTER — Encounter: Payer: Self-pay | Admitting: Internal Medicine

## 2013-05-20 ENCOUNTER — Encounter (HOSPITAL_COMMUNITY)
Admission: RE | Admit: 2013-05-20 | Discharge: 2013-05-20 | Disposition: A | Discharge status: Home / Self Care | Payer: Self-pay | Source: Ambulatory Visit | Attending: Cardiovascular Disease | Admitting: Cardiovascular Disease

## 2013-05-20 NOTE — Progress Notes (Signed)
Patient's blood sugar pre-exercise was 83. Snack and juice given. Recheck blood sugar was 126. Patient exercised without problems.

## 2013-05-21 MED ORDER — HYDROCODONE-ACETAMINOPHEN 10-325 MG PO TABS: 1.0000 | ORAL_TABLET | ORAL | Status: DC | PRN

## 2013-05-22 ENCOUNTER — Encounter (HOSPITAL_COMMUNITY)
Admission: RE | Admit: 2013-05-22 | Discharge: 2013-05-22 | Disposition: A | Discharge status: Home / Self Care | Payer: Self-pay | Source: Ambulatory Visit | Attending: Cardiovascular Disease | Admitting: Cardiovascular Disease

## 2013-05-24 ENCOUNTER — Encounter (HOSPITAL_COMMUNITY): Admission: RE | Admit: 2013-05-24 | Payer: Self-pay | Source: Ambulatory Visit

## 2013-05-27 ENCOUNTER — Encounter (HOSPITAL_COMMUNITY)
Admission: RE | Admit: 2013-05-27 | Discharge: 2013-05-27 | Disposition: A | Discharge status: Home / Self Care | Payer: Self-pay | Source: Ambulatory Visit | Attending: Cardiovascular Disease | Admitting: Cardiovascular Disease

## 2013-05-29 ENCOUNTER — Encounter (HOSPITAL_COMMUNITY): Payer: Self-pay

## 2013-05-31 ENCOUNTER — Encounter (HOSPITAL_COMMUNITY): Payer: Self-pay

## 2013-06-03 ENCOUNTER — Encounter (HOSPITAL_COMMUNITY): Payer: Self-pay

## 2013-06-05 ENCOUNTER — Other Ambulatory Visit: Payer: Self-pay | Admitting: Internal Medicine

## 2013-06-05 ENCOUNTER — Encounter: Payer: Self-pay | Admitting: Cardiovascular Disease

## 2013-06-05 ENCOUNTER — Encounter (HOSPITAL_COMMUNITY): Payer: Medicare Other

## 2013-06-05 ENCOUNTER — Ambulatory Visit (INDEPENDENT_AMBULATORY_CARE_PROVIDER_SITE_OTHER): Payer: Medicare Other | Admitting: Cardiovascular Disease

## 2013-06-05 VITALS — BP 118/84 | HR 86 | Ht 62.0 in | Wt 231.8 lb

## 2013-06-05 DIAGNOSIS — Z5189 Encounter for other specified aftercare: Secondary | ICD-10-CM | POA: Insufficient documentation

## 2013-06-05 DIAGNOSIS — I4891 Unspecified atrial fibrillation: Secondary | ICD-10-CM

## 2013-06-05 DIAGNOSIS — I2510 Atherosclerotic heart disease of native coronary artery without angina pectoris: Secondary | ICD-10-CM | POA: Insufficient documentation

## 2013-06-05 DIAGNOSIS — R0989 Other specified symptoms and signs involving the circulatory and respiratory systems: Secondary | ICD-10-CM

## 2013-06-05 DIAGNOSIS — E785 Hyperlipidemia, unspecified: Secondary | ICD-10-CM | POA: Insufficient documentation

## 2013-06-05 DIAGNOSIS — I10 Essential (primary) hypertension: Secondary | ICD-10-CM

## 2013-06-05 DIAGNOSIS — R0609 Other forms of dyspnea: Secondary | ICD-10-CM

## 2013-06-05 NOTE — Patient Instructions (Signed)
Your physician recommends that you continue on your current medications as directed. Please refer to the Current Medication list given to you today.  Your physician wants you to follow-up in: 1 year with Dr. Cooper.  You will receive a reminder letter in the mail two months in advance. If you don't receive a letter, please call our office to schedule the follow-up appointment.   

## 2013-06-05 NOTE — Progress Notes (Signed)
HPI:  68 year old woman presenting for followup evaluation. She has multiple medical problems. She is followed here because of coronary artery disease with history of CABG in 2004. She underwent cardiac catheterization last year demonstrating stable CAD and continued patency of her LIMA graft. Other problems include paroxysmal atrial fibrillation, history of stroke, morbid obesity, diabetes, hypertension, and left bundle branch block. Her LV function has been within normal limits.  Last lipids were checked October 2014. Result is below: Lipid Panel     Component Value Date/Time   CHOL 119 12/07/2012 0844   TRIG 115 12/07/2012 0844   HDL 36* 12/07/2012 0844   CHOLHDL 3.3 12/07/2012 0844   VLDL 23 12/07/2012 0844   LDLCALC 60 12/07/2012 0844   From a symptomatic perspective, she describes exertional dyspnea. She does have some burning in her chest with walking. This is chronic. She's had no leg swelling, orthopnea, or PND. She continues to really struggle with weight loss. She has difficulty with exercise because of osteoarthritis in her spine and knees. She is participating in cardiac rehabilitation maintenance. She watches her diet carefully.  Outpatient Encounter Prescriptions as of 06/05/2013  Medication Sig  . ALPRAZolam (XANAX) 1 MG tablet Take 1 mg by mouth 3 (three) times daily as needed for anxiety.  Marland Kitchen atenolol (TENORMIN) 100 MG tablet Take 100 mg by mouth every morning.  Marland Kitchen atorvastatin (LIPITOR) 20 MG tablet TAKE 2 TABLETS BY MOUTH EVERY DAY  . clotrimazole-betamethasone (LOTRISONE) cream Apply 1 application topically daily as needed (for itching).  . cyclobenzaprine (FEXMID) 7.5 MG tablet Take 1 tablet (7.5 mg total) by mouth 3 (three) times daily as needed for muscle spasms.  . furosemide (LASIX) 20 MG tablet Take 20-40 mg by mouth See admin instructions. Takes 20mg  daily, but can up addt'l 20mg  as needed  . gabapentin (NEURONTIN) 300 MG capsule TAKE 1 CAPSULE BY MOUTH THREE TIMES  DAILY  . HYDROcodone-acetaminophen (NORCO) 10-325 MG per tablet Take 1 tablet by mouth every 4 (four) hours as needed.  . insulin detemir (LEVEMIR) 100 UNIT/ML injection Inject 50 Units into the skin 2 (two) times daily.  . Levomilnacipran HCl ER (FETZIMA) 40 MG CP24 Take 1 capsule by mouth daily.  . Liraglutide 18 MG/3ML SOPN Inject under skin at 1.8 mg daily.  . metFORMIN (GLUCOPHAGE-XR) 500 MG 24 hr tablet Take 1,000 mg by mouth 2 (two) times daily.  . nitroGLYCERIN (NITROSTAT) 0.4 MG SL tablet Place 1 tablet (0.4 mg total) under the tongue every 5 (five) minutes as needed. For chest pain  . potassium chloride SA (K-DUR,KLOR-CON) 20 MEQ tablet Take 20 mEq by mouth 2 (two) times daily.  Marland Kitchen spironolactone (ALDACTONE) 25 MG tablet Take 25 mg by mouth 2 (two) times daily.  Marland Kitchen telmisartan (MICARDIS) 40 MG tablet Take 40 mg by mouth daily.  Marland Kitchen warfarin (COUMADIN) 6 MG tablet Take 3-6 mg by mouth daily. Takes 6mg  on mondays and thursdays Takes 3 mg all other days  . [DISCONTINUED] loperamide (IMODIUM) 2 MG capsule Take 1 capsule (2 mg total) by mouth as needed for diarrhea or loose stools.    Allergies  Allergen Reactions  . Metformin And Related Other (See Comments)    Must take XR form only, cannot tolerate Regular release metformin  . Cleocin [Clindamycin Hcl] Diarrhea  . Codeine Itching  . Doxycycline Hyclate Diarrhea  . Macrolides And Ketolides Diarrhea  . Morphine Hives  . Pentazocine Lactate Itching and Nausea And Vomiting  . Vibramycin [Doxycycline Calcium] Diarrhea  and Rash  . Sulfonamide Derivatives Rash    Past Medical History  Diagnosis Date  . CAD (coronary artery disease)   . PAF (paroxysmal atrial fibrillation)   . Hypertension   . Hyperlipidemia   . Hypokalemia   . Diabetes mellitus   . Depression   . Lumbar back pain   . LBBB (left bundle branch block)   . Headache(784.0)   . Lymphadenitis   . Arthritis   . Bursitis   . Chronic anticoagulation     on coumadin    . Heart palpitations   . Diastolic dysfunction     per echo in October 2012 with EF 50 to 55%  . 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 GERD (gastroesophageal reflux disease) 10/23/2003  . Fibromyalgia   . Blood transfusion without reported diagnosis     ROS: Positive for back and knee pain, otherwise negative except as per HPI  BP 118/84  Pulse 86  Ht 5\' 2"  (1.575 m)  Wt 231 lb 12.8 oz (105.144 kg)  BMI 42.39 kg/m2  PHYSICAL EXAM: Pt is alert and oriented, pleasant obese woman in NAD HEENT: normal Neck: JVP - difficult to visualize, carotids 2+= without bruits Lungs: CTA bilaterally CV: RRR without murmur or gallop Abd: soft, NT, Positive BS Ext: no C/C/E, distal pulses intact and equal Skin: warm/dry no rash  EKG:  Sinus rhythm with left bundle branch block, heart rate 88 beats per minute.  Cardiac Catheterization 12/07/2012: Procedural Findings:  Hemodynamics:  AO 126/71  LV 129/13  Coronary angiography:  Coronary dominance: right  Left mainstem: The left mainstem is patent. There is mild irregularity without significant stenosis.  Left anterior descending (LAD): The LAD is patent throughout its course. There is mild focal 50% stenosis at the junction of the proximal and mid LAD. The diagonal branch is patent. There is no significant stenosis seen throughout the distribution of the LAD or its branches.  Left circumflex (LCx): The left circumflex is patent throughout its course. The intermediate branch and obtuse marginal branches are patent with minor irregularities but no significant stenoses.  LIMA to LAD: Patent. This is a small vessel with competitive filling from the native vessel.  Right coronary artery (RCA): The RCA is normal in caliber. There is no obstructive disease throughout its course. There is a faint collateral filling what appears to be a distal subbranch of the left circumflex.  Left ventriculography: Left ventricular systolic function is moderately  depressed. There is severe hypokinesis of the anterolateral wall. There is severe hypokinesis of the distal inferior wall. The estimated left ventricular ejection fraction is 45%.  Final Conclusions:  1. Nonobstructive LAD stenosis with a patent LIMA to LAD  2. Widely patent left circumflex and right coronary artery  3. Moderate segmental left ventricular systolic dysfunction  Recommendations: Continued medical therapy.  Sherren Mocha  ASSESSMENT AND PLAN: 1. Coronary artery disease, native vessel. Recent cardiac catheterization findings reviewed. She has no high-grade obstructive CAD. Continue with medical management.  2. Hypertension. Blood pressure is controlled on current program.  3. Chronic diastolic heart failure. New [place] Heart Association functional class 2-3. She does not appear volume overloaded. I think primary limitation is related to her obesity. We had a lengthy discussion related to exercise and diet. Reviewed exercise strategies. I think she needs to exercise 6 days per week, even if it is at a low level. Reviewed some pool exercises, stationary by, and other suggestions considering her specific limitations.  4. Hyperlipidemia. Lipids are at goal as outlined above. The patient is on atorvastatin.  5. PAF - maintaining sinus rhythm. On warfarin.  Her followup will see her back in one year. I would be happy to see her sooner if any problems arise.  Sherren Mocha 06/05/2013 12:32 PM

## 2013-06-07 ENCOUNTER — Other Ambulatory Visit: Payer: Self-pay | Admitting: Internal Medicine

## 2013-06-07 ENCOUNTER — Ambulatory Visit: Payer: Medicare Other | Admitting: Internal Medicine

## 2013-06-07 ENCOUNTER — Encounter (HOSPITAL_COMMUNITY): Payer: Self-pay

## 2013-06-09 ENCOUNTER — Other Ambulatory Visit: Payer: Self-pay | Admitting: Cardiology

## 2013-06-09 ENCOUNTER — Other Ambulatory Visit: Payer: Self-pay | Admitting: Internal Medicine

## 2013-06-10 ENCOUNTER — Ambulatory Visit (INDEPENDENT_AMBULATORY_CARE_PROVIDER_SITE_OTHER): Payer: Medicare Other | Admitting: Internal Medicine

## 2013-06-10 ENCOUNTER — Encounter (HOSPITAL_COMMUNITY)
Admission: RE | Admit: 2013-06-10 | Discharge: 2013-06-10 | Disposition: A | Discharge status: Home / Self Care | Payer: Self-pay | Source: Ambulatory Visit | Attending: Cardiovascular Disease | Admitting: Cardiovascular Disease

## 2013-06-10 ENCOUNTER — Encounter: Payer: Self-pay | Admitting: Internal Medicine

## 2013-06-10 VITALS — BP 118/78 | HR 105 | Temp 97.8°F | Ht 62.0 in | Wt 230.0 lb

## 2013-06-10 DIAGNOSIS — E1159 Type 2 diabetes mellitus with other circulatory complications: Secondary | ICD-10-CM

## 2013-06-10 DIAGNOSIS — E1151 Type 2 diabetes mellitus with diabetic peripheral angiopathy without gangrene: Secondary | ICD-10-CM

## 2013-06-10 NOTE — Patient Instructions (Signed)
-   Please decrease the  Levemir to 40 units 2x a day - Continue Metformin XR 1000 mg 2x a day - Continue Victoza 1.2 mg - increase to 1.8 mg only if sugars in am >130 or sugars after meals >160 consistently  Please return in 3 months with your sugar log.   Keep up the great work!

## 2013-06-10 NOTE — Progress Notes (Signed)
Patient ID: Kristen [name], female   DOB: 1945/05/20, 68 y.o.   MRN: [id number]  HPI: Kristen [name] is a 68 y.o.-year-old female, returning for DM2, dx 2008, insulin-dependent, uncontrolled, with complications (CAD, PAD, h/o CVD, peripheral neuropathy). Last visit 1.5 mo ago.  Last hemoglobin A1c was: Lab Results  Component Value Date   HGBA1C 8.7* 05/03/2013   HGBA1C 7.2* 11/14/2012   HGBA1C 7.6* 08/30/2012    Pt is on a regimen of: - Levemir pen 50 << 60 units bid - she is not affording Levemir and gets samples from PCP, but this will be solved soon as she gets the med directly from [facility] - Metformin XR 1000 mg bid - tolerates it well - Victoza 1.2 mg - added 03/2013 She was on Bydureon in the past, then also on Novolog 60-70-75.  She tried Januvia before.   Pt checks her sugars 2-3x a day (no log) and they are improved: - am: 90-115 >> 89-131 >> 90-100 >> 150-170 >> 70-120 (135) - 2h after b'fast: 116 Has cardiac rehab - before lunch: 120-140 >> 82-125 >> 79 >> n/c >> 100-140 - before dinner: 99-168 >> n/c >> 80-120 - after dinner: 120-130 >> 250-300s, but now 180s >> 90-150s (higher with bread) - bedtime: 110-120 >> 91-123, once 211 >> 90-100 >> n/c Had some lows in the 70s;  she has hypoglycemia awareness at 100. Highest 200's - few times.  She was eating a lot of sweets, now cut down, thinks she is having too much fruit cut down, thinks she is having too much fruit Started cardiac rehab after last visit. She lost 10 lbs in the last 1.5 mo.  Pt does not have chronic kidney disease, last BUN/creatinine was:  Lab Results  Component Value Date   BUN 14 04/13/2013   CREATININE 0.86 04/13/2013   Last set of lipids: Lab Results  Component Value Date   CHOL 119 12/07/2012   HDL 36* 12/07/2012   LDLCALC 60 12/07/2012   LDLDIRECT 116.0 11/28/2008   TRIG 115 12/07/2012   CHOLHDL 3.3 12/07/2012   Pt's last eye exam was last year. Reportingly no DR.  Denies numbness and tingling in her legs.  She had a stress  test on 11/14/2012 b/c palpitations and tachycardia (on Holter monitor) >> cath on 11/30/2012.   I reviewed her chart and she also has a history of hypertension, paroxysmal A. Fib-on anticoagulation, depression, osteoarthritis - status post arthroscopic knee surgeries x2, status post lumbar laminectomy x3, hyperlipidemia, morbid obesity, chronic lymphadenitis, GERD, hypothyroidism, headaches, fibromyalgia.  ROS: Constitutional: no fatigue, + weight loss, + hot flashes, + poor sleep Eyes: + blurry vision, no xerophthalmia ENT: no sore throat, no nodules palpated in throat, no dysphagia/odynophagia, no hoarseness Cardiovascular: no CP/+ palpitations/+ leg swelling Respiratory: + cough/+ SOB with activity  Gastrointestinal: no vomiting diarrhea or constipation, but has nausea Musculoskeletal: + muscle aches/+ joint aches Skin: no rashes, + hair thinning Neurological: no tremors/numbness/tingling/dizziness  I reviewed pt's medications, allergies, PMH, social hx, family hx and no changes required, except as mentioned above.  PE: BP 118/78  Pulse 105  Temp(Src) 97.8 F (36.6 C) (Oral)  Ht 5\' 2"  (1.575 m)  Wt 230 lb (104.327 kg)  BMI 42.06 kg/m2  SpO2 95% Wt Readings from Last 3 Encounters:  06/10/13 230 lb (104.327 kg)  06/05/13 231 lb 12.8 oz (105.144 kg)  05/17/13 232 lb (105.235 kg)   Constitutional: overweight, in NAD, full supraclavicular fat pads Eyes: PERRLA, EOMI, no exophthalmos ENT: moist mucous membranes, no thyromegaly, no cervical lymphadenopathy  Cardiovascular: RRR, No MRG Respiratory: CTA B Gastrointestinal: abdomen soft, NT, ND, BS+ Musculoskeletal: no deformities, strength intact in all 4 Skin: moist, warm, acanthosis nigricans  ASSESSMENT: 1. DM2, insulin-dependent, uncontrolled, with complications - CAD- Echo 12/2010: EF 50-55%, status post CABG 2004: LIMA to LAD - cards: Dr. Lia Foyer. Also has a LBBB.  - PAD - h/o stroke - peripheral neuropathy - on  neurontin  PLAN:  1. Patient with a six-year history of diabetes, on high-dose of basal insulin. She did very well with decreasing her Levemir dose and adding Metformin, and then Tradjenta. She also lost 10 lbs since I saw her 1.5 mo agO! I congratulated her - I advised her to Patient Instructions  - Please decrease the  Levemir to 40 units 2x a day - Continue Metformin XR 1000 mg 2x a day - Continue Victoza 1.2 mg - increase to 1.8 mg only if sugars in am >130 or sugars after meals >160 consistently Please return in 3 months with your sugar log.  Keep up the great work! - check HbA1C at next visit - RTC in 3 mo

## 2013-06-12 ENCOUNTER — Encounter (HOSPITAL_COMMUNITY)
Admission: RE | Admit: 2013-06-12 | Discharge: 2013-06-12 | Disposition: A | Discharge status: Home / Self Care | Payer: Self-pay | Source: Ambulatory Visit | Attending: Cardiovascular Disease | Admitting: Cardiovascular Disease

## 2013-06-14 ENCOUNTER — Ambulatory Visit (INDEPENDENT_AMBULATORY_CARE_PROVIDER_SITE_OTHER): Payer: Medicare Other | Admitting: General Practice

## 2013-06-14 ENCOUNTER — Encounter (HOSPITAL_COMMUNITY)
Admission: RE | Admit: 2013-06-14 | Discharge: 2013-06-14 | Disposition: A | Discharge status: Home / Self Care | Payer: Self-pay | Source: Ambulatory Visit | Attending: Cardiovascular Disease | Admitting: Cardiovascular Disease

## 2013-06-14 DIAGNOSIS — Z7901 Long term (current) use of anticoagulants: Secondary | ICD-10-CM

## 2013-06-14 DIAGNOSIS — Z5181 Encounter for therapeutic drug level monitoring: Secondary | ICD-10-CM

## 2013-06-14 DIAGNOSIS — I4891 Unspecified atrial fibrillation: Secondary | ICD-10-CM

## 2013-06-14 LAB — POCT INR: INR: 1.8

## 2013-06-14 NOTE — Progress Notes (Signed)
Pre visit review using our clinic review tool, if applicable. No additional management support is needed unless otherwise documented below in the visit note. 

## 2013-06-17 ENCOUNTER — Encounter: Payer: Self-pay | Admitting: Internal Medicine

## 2013-06-17 ENCOUNTER — Encounter (HOSPITAL_COMMUNITY)
Admission: RE | Admit: 2013-06-17 | Discharge: 2013-06-17 | Disposition: A | Discharge status: Home / Self Care | Payer: Self-pay | Source: Ambulatory Visit | Attending: Cardiovascular Disease | Admitting: Cardiovascular Disease

## 2013-06-19 ENCOUNTER — Encounter (HOSPITAL_COMMUNITY): Payer: Self-pay

## 2013-06-19 ENCOUNTER — Ambulatory Visit: Payer: Medicare Other | Admitting: Cardiovascular Disease

## 2013-06-20 ENCOUNTER — Telehealth: Payer: Self-pay | Admitting: Internal Medicine

## 2013-06-20 MED ORDER — HYDROCODONE-ACETAMINOPHEN 10-325 MG PO TABS: 1.0000 | ORAL_TABLET | ORAL | Status: DC | PRN

## 2013-06-20 NOTE — Telephone Encounter (Signed)
Hydrocodone rx printed and awaiting sig from doc

## 2013-06-20 NOTE — Telephone Encounter (Signed)
Pt sent email and needs new rx hydrocodone. Pt would like to pick up med this Friday if possible

## 2013-06-21 ENCOUNTER — Encounter (HOSPITAL_COMMUNITY)
Admission: RE | Admit: 2013-06-21 | Discharge: 2013-06-21 | Disposition: A | Discharge status: Home / Self Care | Payer: Self-pay | Source: Ambulatory Visit | Attending: Cardiovascular Disease | Admitting: Cardiovascular Disease

## 2013-06-24 ENCOUNTER — Encounter (HOSPITAL_COMMUNITY): Payer: Self-pay

## 2013-06-26 ENCOUNTER — Encounter (HOSPITAL_COMMUNITY): Payer: Self-pay

## 2013-06-28 ENCOUNTER — Encounter (HOSPITAL_COMMUNITY): Payer: Self-pay

## 2013-06-30 ENCOUNTER — Encounter: Payer: Self-pay | Admitting: Internal Medicine

## 2013-07-01 ENCOUNTER — Encounter (HOSPITAL_COMMUNITY)
Admission: RE | Admit: 2013-07-01 | Discharge: 2013-07-01 | Disposition: A | Discharge status: Home / Self Care | Payer: Self-pay | Source: Ambulatory Visit | Attending: Cardiovascular Disease | Admitting: Cardiovascular Disease

## 2013-07-03 ENCOUNTER — Encounter (HOSPITAL_COMMUNITY)
Admission: RE | Admit: 2013-07-03 | Discharge: 2013-07-03 | Disposition: A | Discharge status: Home / Self Care | Payer: Self-pay | Source: Ambulatory Visit | Attending: Cardiovascular Disease | Admitting: Cardiovascular Disease

## 2013-07-05 ENCOUNTER — Other Ambulatory Visit: Payer: Self-pay | Admitting: Internal Medicine

## 2013-07-05 ENCOUNTER — Encounter (HOSPITAL_COMMUNITY)
Admission: RE | Admit: 2013-07-05 | Discharge: 2013-07-05 | Disposition: A | Discharge status: Home / Self Care | Payer: Self-pay | Source: Ambulatory Visit | Attending: Cardiovascular Disease | Admitting: Cardiovascular Disease

## 2013-07-05 DIAGNOSIS — Z5189 Encounter for other specified aftercare: Secondary | ICD-10-CM | POA: Insufficient documentation

## 2013-07-05 DIAGNOSIS — I2510 Atherosclerotic heart disease of native coronary artery without angina pectoris: Secondary | ICD-10-CM | POA: Insufficient documentation

## 2013-07-05 DIAGNOSIS — I10 Essential (primary) hypertension: Secondary | ICD-10-CM | POA: Insufficient documentation

## 2013-07-05 DIAGNOSIS — E785 Hyperlipidemia, unspecified: Secondary | ICD-10-CM | POA: Insufficient documentation

## 2013-07-08 ENCOUNTER — Encounter (HOSPITAL_COMMUNITY): Payer: Self-pay

## 2013-07-10 ENCOUNTER — Encounter (HOSPITAL_COMMUNITY): Payer: Self-pay

## 2013-07-12 ENCOUNTER — Encounter (HOSPITAL_COMMUNITY): Payer: Self-pay

## 2013-07-12 ENCOUNTER — Ambulatory Visit: Payer: Medicare Other | Admitting: Family

## 2013-07-15 ENCOUNTER — Encounter (HOSPITAL_COMMUNITY): Payer: Self-pay

## 2013-07-17 ENCOUNTER — Encounter (HOSPITAL_COMMUNITY): Payer: Self-pay

## 2013-07-17 ENCOUNTER — Encounter: Payer: Self-pay | Admitting: Internal Medicine

## 2013-07-19 ENCOUNTER — Encounter (HOSPITAL_COMMUNITY): Payer: Self-pay

## 2013-07-22 ENCOUNTER — Encounter (HOSPITAL_COMMUNITY): Payer: Self-pay

## 2013-07-22 ENCOUNTER — Telehealth (HOSPITAL_COMMUNITY): Payer: Self-pay | Admitting: *Deleted

## 2013-07-22 MED ORDER — HYDROCODONE-ACETAMINOPHEN 10-325 MG PO TABS: 1.0000 | ORAL_TABLET | ORAL | Status: DC | PRN

## 2013-07-24 ENCOUNTER — Encounter (HOSPITAL_COMMUNITY)
Admission: RE | Admit: 2013-07-24 | Discharge: 2013-07-24 | Disposition: A | Discharge status: Home / Self Care | Payer: Self-pay | Source: Ambulatory Visit | Attending: Cardiovascular Disease | Admitting: Cardiovascular Disease

## 2013-07-26 ENCOUNTER — Ambulatory Visit (INDEPENDENT_AMBULATORY_CARE_PROVIDER_SITE_OTHER): Payer: Medicare Other | Admitting: General Practice

## 2013-07-26 ENCOUNTER — Encounter (HOSPITAL_COMMUNITY)
Admission: RE | Admit: 2013-07-26 | Discharge: 2013-07-26 | Disposition: A | Discharge status: Home / Self Care | Payer: Self-pay | Source: Ambulatory Visit | Attending: Cardiovascular Disease | Admitting: Cardiovascular Disease

## 2013-07-26 ENCOUNTER — Ambulatory Visit: Payer: Medicare Other

## 2013-07-26 DIAGNOSIS — Z7901 Long term (current) use of anticoagulants: Secondary | ICD-10-CM

## 2013-07-26 DIAGNOSIS — Z5181 Encounter for therapeutic drug level monitoring: Secondary | ICD-10-CM

## 2013-07-26 DIAGNOSIS — I4891 Unspecified atrial fibrillation: Secondary | ICD-10-CM

## 2013-07-26 LAB — POCT INR: INR: 1.7

## 2013-07-26 NOTE — Progress Notes (Signed)
Pre visit review using our clinic review tool, if applicable. No additional management support is needed unless otherwise documented below in the visit note. 

## 2013-07-31 ENCOUNTER — Encounter (HOSPITAL_COMMUNITY)
Admission: RE | Admit: 2013-07-31 | Discharge: 2013-07-31 | Disposition: A | Discharge status: Home / Self Care | Payer: Self-pay | Source: Ambulatory Visit | Attending: Cardiovascular Disease | Admitting: Cardiovascular Disease

## 2013-07-31 NOTE — Progress Notes (Signed)
Blood sugar 82 pre exercise. Patient given tang.  Repeat blood sugar 98. Patient requested graham crackers and checked her blood sugar 5 minutes later. Advised the patient not to exercise today due to policy for IDDM. Blood sugar is supposed to be greater than 110. Patient was counseled by our dietitian. Kristen [name] said she would go eat lunch after that and return to exercise on Friday.

## 2013-07-31 NOTE — Progress Notes (Signed)
Nutrition Note Spoke with pt. Fasting CBG this am reportedly 102 mg/dL. Pt took medications (including DM meds) and ate "a bowl of mixed fruit, which included cantalope" around 6 am this morning. At 8:30 am pt reports eating steak and gravy with a small piece of bread. Pre-exercise CBG was 82 mg/dL. After Tang, CBG increased to 98 mg/dL. Pt ate graham crackers and re-checked her CBG < 5 min later and CBG was 93 mg/dL. Pt educated re: CHO needed per meal and before exercise. This Probation officer reiterated the importance of waiting at least 15 minutes after eating to check pre-exercise CBG. Pt educated re: 250 gm Consistent CHO diet. Pt taking Coumadin. The need to follow a diet consistent in vitamin K intake discussed. Pt has been avoiding all foods high in vitamin K. Pt educated re: Consistent Vitamin K intake on Coumadin. Pt expressed understanding of the information reviewed.  Intervention Handouts given for: 1800 kcal, 5-day menu ideas; Consistent Vitamin K intake Plan Pt to consume adequate CHO before exercise so pre-exercise CBG's are 110 mg/dL. Continue client-centered nutrition education by RD as part of interdisciplinary care.  Monitor and evaluate progress toward nutrition goal with team.  Derek Mound, M.Ed, RD, LDN, CDE 07/31/2013 12:26 PM

## 2013-08-02 ENCOUNTER — Telehealth (HOSPITAL_COMMUNITY): Payer: Self-pay | Admitting: *Deleted

## 2013-08-02 ENCOUNTER — Encounter (HOSPITAL_COMMUNITY): Admission: RE | Admit: 2013-08-02 | Payer: Self-pay | Source: Ambulatory Visit

## 2013-08-02 NOTE — Telephone Encounter (Signed)
Pt called and left message on voicemail.  Pt will be absent today because she does not have transportation.  Hopes to return next week.

## 2013-08-04 ENCOUNTER — Other Ambulatory Visit: Payer: Self-pay | Admitting: Internal Medicine

## 2013-08-05 ENCOUNTER — Encounter (HOSPITAL_COMMUNITY): Payer: Medicare Other

## 2013-08-05 DIAGNOSIS — I10 Essential (primary) hypertension: Secondary | ICD-10-CM | POA: Insufficient documentation

## 2013-08-05 DIAGNOSIS — E785 Hyperlipidemia, unspecified: Secondary | ICD-10-CM | POA: Insufficient documentation

## 2013-08-05 DIAGNOSIS — Z5189 Encounter for other specified aftercare: Secondary | ICD-10-CM | POA: Insufficient documentation

## 2013-08-05 DIAGNOSIS — I2510 Atherosclerotic heart disease of native coronary artery without angina pectoris: Secondary | ICD-10-CM | POA: Insufficient documentation

## 2013-08-07 ENCOUNTER — Encounter (HOSPITAL_COMMUNITY)
Admission: RE | Admit: 2013-08-07 | Discharge: 2013-08-07 | Disposition: A | Discharge status: Home / Self Care | Payer: Self-pay | Source: Ambulatory Visit | Attending: Cardiovascular Disease | Admitting: Cardiovascular Disease

## 2013-08-09 ENCOUNTER — Encounter (HOSPITAL_COMMUNITY)
Admission: RE | Admit: 2013-08-09 | Discharge: 2013-08-09 | Disposition: A | Discharge status: Home / Self Care | Payer: Self-pay | Source: Ambulatory Visit | Attending: Cardiovascular Disease | Admitting: Cardiovascular Disease

## 2013-08-09 ENCOUNTER — Ambulatory Visit (INDEPENDENT_AMBULATORY_CARE_PROVIDER_SITE_OTHER): Payer: Medicare Other | Admitting: Internal Medicine

## 2013-08-09 ENCOUNTER — Encounter: Payer: Self-pay | Admitting: Internal Medicine

## 2013-08-09 VITALS — BP 120/80 | HR 84 | Temp 97.7°F | Ht 61.0 in | Wt 229.0 lb

## 2013-08-09 DIAGNOSIS — I4891 Unspecified atrial fibrillation: Secondary | ICD-10-CM

## 2013-08-09 DIAGNOSIS — M1710 Unilateral primary osteoarthritis, unspecified knee: Secondary | ICD-10-CM

## 2013-08-09 DIAGNOSIS — Z9109 Other allergy status, other than to drugs and biological substances: Secondary | ICD-10-CM

## 2013-08-09 DIAGNOSIS — Z23 Encounter for immunization: Secondary | ICD-10-CM

## 2013-08-09 MED ORDER — HYDROCODONE-ACETAMINOPHEN 10-325 MG PO TABS: 1.0000 | ORAL_TABLET | ORAL | Status: DC | PRN

## 2013-08-09 NOTE — Progress Notes (Signed)
Pre visit review using our clinic review tool, if applicable. No additional management support is needed unless otherwise documented below in the visit note. 

## 2013-08-09 NOTE — Patient Instructions (Signed)
The patient is instructed to continue all medications as prescribed. Schedule followup with check out clerk upon leaving the clinic  

## 2013-08-09 NOTE — Progress Notes (Signed)
Subjective:    Patient ID: Kristen [name], female    DOB: 06-26-1945, 68 y.o.   MRN: [id number]  HPI  DM follow up Having "wheezing" and increased cough that she feels is "wet" but has not noted any change in DOE Sat was 97 on RA Suspect the wheezing is allergies   Review of Systems  Constitutional: Positive for fatigue. Negative for activity change and appetite change.  HENT: Negative for congestion, ear pain, postnasal drip and sinus pressure.   Eyes: Negative for redness and visual disturbance.  Respiratory: Positive for cough, shortness of breath and wheezing.   Gastrointestinal: Negative for abdominal pain and abdominal distention.  Genitourinary: Positive for frequency. Negative for dysuria and menstrual problem.  Musculoskeletal: Negative for arthralgias, joint swelling, myalgias and neck pain.  Skin: Negative for rash and wound.  Neurological: Negative for dizziness, weakness and headaches.  Hematological: Negative for adenopathy. Does not bruise/bleed easily.  Psychiatric/Behavioral: Negative for sleep disturbance and decreased concentration.   Past Medical History  Diagnosis Date  . CAD (coronary artery disease)   . PAF (paroxysmal atrial fibrillation)   . Hypertension   . Hyperlipidemia   . Hypokalemia   . Diabetes mellitus   . Depression   . Lumbar back pain   . LBBB (left bundle branch block)   . Headache(784.0)   . Lymphadenitis   . Arthritis   . Bursitis   . Chronic anticoagulation     on coumadin  . Heart palpitations   . Diastolic dysfunction     per echo in October 2012 with EF 50 to 55%  . 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 GERD (gastroesophageal reflux disease) 10/23/2003  . Fibromyalgia   . Blood transfusion without reported diagnosis     History   Social History  . Marital Status: Legally Separated    Spouse Name: N/A    Number of Children: N/A  . Years of Education: N/A   Occupational History  . Not on file.   Social  History Main Topics  . Smoking status: Former Smoker    Quit date: 03/07/2001  . Smokeless tobacco: Never Used  . Alcohol Use: No  . Drug Use: No  . Sexual Activity: Not on file   Other Topics Concern  . Not on file   Social History Narrative   Lives locally, has help available if needed.    Past Surgical History  Procedure Laterality Date  . Angioplasty  laminectomy  . Lumbar laminectomy      x3  . Coronary artery bypass graft      LIMA to LAD   . Abdominal hysterectomy    . Cholecystectomy    . Tonsillectomy    . Knee arthroscopy    . Sphincterotomy      Family History  Problem Relation Age of Onset  . Heart attack Father   . Heart disease Father   . Kidney disease    . Stroke    . Arthritis    . Hypertension    . Diabetes    . Stroke Mother   . Colon cancer Paternal Uncle     Allergies  Allergen Reactions  . Metformin And Related Other (See Comments)    Must take XR form only, cannot tolerate Regular release metformin  . Cleocin [Clindamycin Hcl] Diarrhea  . Codeine Itching  . Doxycycline Hyclate Diarrhea  . Macrolides And Ketolides Diarrhea  . Morphine Hives  . Pentazocine Lactate Itching and Nausea  And Vomiting  . Vibramycin [Doxycycline Calcium] Diarrhea and Rash  . Sulfonamide Derivatives Rash    Current Outpatient Prescriptions on File Prior to Visit  Medication Sig Dispense Refill  . ALPRAZolam (XANAX) 1 MG tablet Take 1 mg by mouth 3 (three) times daily as needed for anxiety.      Marland Kitchen atenolol (TENORMIN) 100 MG tablet Take 100 mg by mouth every morning.      Marland Kitchen atorvastatin (LIPITOR) 20 MG tablet TAKE 2 TABLETS BY MOUTH DAILY  60 tablet  3  . clotrimazole-betamethasone (LOTRISONE) cream Apply 1 application topically daily as needed (for itching).      . cyclobenzaprine (FEXMID) 7.5 MG tablet Take 1 tablet (7.5 mg total) by mouth 3 (three) times daily as needed for muscle spasms.  15 tablet  0  . furosemide (LASIX) 20 MG tablet Take 20-40 mg by  mouth See admin instructions. Takes 20mg  daily, but can up addt'l 20mg  as needed      . gabapentin (NEURONTIN) 300 MG capsule TAKE ONE CAPSULE BY MOUTH THREE TIMES DAILY  90 capsule  3  . HYDROcodone-acetaminophen (NORCO) 10-325 MG per tablet Take 1 tablet by mouth every 4 (four) hours as needed.  150 tablet  0  . insulin detemir (LEVEMIR) 100 UNIT/ML injection Inject 50 Units into the skin 2 (two) times daily.      . Levomilnacipran HCl ER (FETZIMA) 40 MG CP24 Take 1 capsule by mouth daily.  30 capsule    . Liraglutide 18 MG/3ML SOPN Inject under skin at 1.8 mg daily.  9 mL  3  . metFORMIN (GLUCOPHAGE-XR) 500 MG 24 hr tablet Take 1,000 mg by mouth 2 (two) times daily.      . nitroGLYCERIN (NITROSTAT) 0.4 MG SL tablet Place 1 tablet (0.4 mg total) under the tongue every 5 (five) minutes as needed. For chest pain  30 tablet  6  . ONE TOUCH ULTRA TEST test strip TEST BLOOD SUGAR SIX TIMES A DAY AS DIRECTED  100 each  11  . potassium chloride SA (K-DUR,KLOR-CON) 20 MEQ tablet TAKE 1 TABLET BY MOUTH TWICE DAILY  60 tablet  11  . spironolactone (ALDACTONE) 25 MG tablet TAKE 1 TABLET BY MOUTH TWICE DAILY  60 tablet  3  . telmisartan (MICARDIS) 40 MG tablet Take 40 mg by mouth daily.      Marland Kitchen warfarin (COUMADIN) 6 MG tablet Take 3-6 mg by mouth daily. Takes 6mg  on mondays and thursdays Takes 3 mg all other days       No current facility-administered medications on file prior to visit.    BP 120/80  Pulse 84  Temp(Src) 97.7 F (36.5 C) (Oral)  Ht 5\' 1"  (1.549 m)  Wt 229 lb (103.874 kg)  BMI 43.29 kg/m2       Objective:   Physical Exam  Nursing note and vitals reviewed. HENT:  Head: Normocephalic and atraumatic.  Neck: Normal range of motion. Neck supple.  Cardiovascular: Regular rhythm.   Murmur heard. Pulmonary/Chest: Effort normal and breath sounds normal.  Abdominal: Soft. Bowel sounds are normal.  Skin: Skin is warm and dry.          Assessment & Plan:  Chronic allergies and  mild asthma  Refill of appropriate medications  Walking and weight loss discussed  Pain management per protocol with drug screen  Stable DM seeing endocrine  Needs prevnar today  Dr Jake Bathe at [facility].

## 2013-08-12 ENCOUNTER — Emergency Department (HOSPITAL_COMMUNITY)
Admission: EM | Admit: 2013-08-12 | Discharge: 2013-08-12 | Disposition: A | Discharge status: Home / Self Care | Payer: Medicare Other | Attending: Emergency Medicine | Admitting: Emergency Medicine

## 2013-08-12 ENCOUNTER — Encounter (HOSPITAL_COMMUNITY)
Admission: RE | Admit: 2013-08-12 | Discharge: 2013-08-12 | Disposition: A | Discharge status: Home / Self Care | Payer: Self-pay | Source: Ambulatory Visit | Attending: Cardiovascular Disease | Admitting: Cardiovascular Disease

## 2013-08-12 ENCOUNTER — Emergency Department (HOSPITAL_COMMUNITY): Payer: Medicare Other

## 2013-08-12 ENCOUNTER — Encounter (HOSPITAL_COMMUNITY): Payer: Self-pay | Admitting: Emergency Medicine

## 2013-08-12 ENCOUNTER — Telehealth: Payer: Self-pay | Admitting: Internal Medicine

## 2013-08-12 DIAGNOSIS — Z8739 Personal history of other diseases of the musculoskeletal system and connective tissue: Secondary | ICD-10-CM | POA: Insufficient documentation

## 2013-08-12 DIAGNOSIS — IMO0001 Reserved for inherently not codable concepts without codable children: Secondary | ICD-10-CM | POA: Insufficient documentation

## 2013-08-12 DIAGNOSIS — R296 Repeated falls: Secondary | ICD-10-CM | POA: Insufficient documentation

## 2013-08-12 DIAGNOSIS — I2510 Atherosclerotic heart disease of native coronary artery without angina pectoris: Secondary | ICD-10-CM | POA: Insufficient documentation

## 2013-08-12 DIAGNOSIS — E119 Type 2 diabetes mellitus without complications: Secondary | ICD-10-CM | POA: Insufficient documentation

## 2013-08-12 DIAGNOSIS — F3289 Other specified depressive episodes: Secondary | ICD-10-CM | POA: Insufficient documentation

## 2013-08-12 DIAGNOSIS — Z7901 Long term (current) use of anticoagulants: Secondary | ICD-10-CM | POA: Insufficient documentation

## 2013-08-12 DIAGNOSIS — Y939 Activity, unspecified: Secondary | ICD-10-CM | POA: Insufficient documentation

## 2013-08-12 DIAGNOSIS — S0990XA Unspecified injury of head, initial encounter: Secondary | ICD-10-CM | POA: Insufficient documentation

## 2013-08-12 DIAGNOSIS — F329 Major depressive disorder, single episode, unspecified: Secondary | ICD-10-CM | POA: Insufficient documentation

## 2013-08-12 DIAGNOSIS — M549 Dorsalgia, unspecified: Secondary | ICD-10-CM

## 2013-08-12 DIAGNOSIS — Z79899 Other long term (current) drug therapy: Secondary | ICD-10-CM | POA: Insufficient documentation

## 2013-08-12 DIAGNOSIS — I5030 Unspecified diastolic (congestive) heart failure: Secondary | ICD-10-CM | POA: Insufficient documentation

## 2013-08-12 DIAGNOSIS — Z87891 Personal history of nicotine dependence: Secondary | ICD-10-CM | POA: Insufficient documentation

## 2013-08-12 DIAGNOSIS — Z8673 Personal history of transient ischemic attack (TIA), and cerebral infarction without residual deficits: Secondary | ICD-10-CM | POA: Insufficient documentation

## 2013-08-12 DIAGNOSIS — M129 Arthropathy, unspecified: Secondary | ICD-10-CM | POA: Insufficient documentation

## 2013-08-12 DIAGNOSIS — IMO0002 Reserved for concepts with insufficient information to code with codable children: Secondary | ICD-10-CM | POA: Insufficient documentation

## 2013-08-12 DIAGNOSIS — I10 Essential (primary) hypertension: Secondary | ICD-10-CM | POA: Insufficient documentation

## 2013-08-12 DIAGNOSIS — W19XXXA Unspecified fall, initial encounter: Secondary | ICD-10-CM

## 2013-08-12 DIAGNOSIS — Z951 Presence of aortocoronary bypass graft: Secondary | ICD-10-CM | POA: Insufficient documentation

## 2013-08-12 DIAGNOSIS — Z794 Long term (current) use of insulin: Secondary | ICD-10-CM | POA: Insufficient documentation

## 2013-08-12 DIAGNOSIS — E785 Hyperlipidemia, unspecified: Secondary | ICD-10-CM | POA: Insufficient documentation

## 2013-08-12 DIAGNOSIS — Z9861 Coronary angioplasty status: Secondary | ICD-10-CM | POA: Insufficient documentation

## 2013-08-12 DIAGNOSIS — Z862 Personal history of diseases of the blood and blood-forming organs and certain disorders involving the immune mechanism: Secondary | ICD-10-CM | POA: Insufficient documentation

## 2013-08-12 DIAGNOSIS — Z8719 Personal history of other diseases of the digestive system: Secondary | ICD-10-CM | POA: Insufficient documentation

## 2013-08-12 DIAGNOSIS — Y9229 Other specified public building as the place of occurrence of the external cause: Secondary | ICD-10-CM | POA: Insufficient documentation

## 2013-08-12 MED ORDER — HYDROCODONE-ACETAMINOPHEN 5-325 MG PO TABS
2.0000 | ORAL_TABLET | Freq: Once | ORAL | Status: AC
  Administered 2013-08-12: 1 via ORAL
  Filled 2013-08-12: qty 1

## 2013-08-12 MED ORDER — HYDROCODONE-ACETAMINOPHEN 5-325 MG PO TABS
1.0000 | ORAL_TABLET | Freq: Once | ORAL | Status: AC
  Administered 2013-08-12: 1 via ORAL
  Filled 2013-08-12: qty 1

## 2013-08-12 NOTE — ED Notes (Signed)
Pt reports was going to cardiac rehab this morning and went to use bathroom in hospital, put her purse on the door and went to sit down on the toilet seat and missed the seat. C/o right lower back pain, shoulder and  Right sided head pain. No LOC, pt was able to get up on her own. Denies CP. Is a x 4

## 2013-08-12 NOTE — Telephone Encounter (Signed)
Kristen [name] from [facility] called to say that pt fell in the bathroom at rehab she missed the commode and they took her to the ER she was complaining of back and head pain

## 2013-08-12 NOTE — Progress Notes (Signed)
Patient reported that she fell in the bathroom across from the cardiac rehab department. Fraser Din said she hit the back of the right side of her head her right shoulder and her right lower back. Upon assessment no swelling or bruising noted. Blood pressure 142/72 , heart rate 87. Oxygen saturation 98% on room air. Patient checked her CBG with a home meter it was 103. Fraser Din said she did not look back when she pulled her pants down in the bathroom and missed the toilet seat. Patient was taken to the ED for further evaluation. Fraser Din reported experiencing some right lower back pain. Patient called her son to update him on today's events. Ice pack applied to Right lower back. Patient transported to the ED via wheelchair. Dr Adline Mango office called and notified of events.

## 2013-08-12 NOTE — ED Notes (Addendum)
PT states she was going to cardiac rehab, backed up to toilet in handicapped stall in main part of hospital sat down thinking it was the toilet but landed on the floor. PT reports hx of multiple lumbar surgeries, fibromyalgia, severe arthritis, and LT use of coumadin (since 2004).

## 2013-08-12 NOTE — ED Provider Notes (Signed)
CSN: [id number]     Arrival date & time 08/12/13  1117 History   First MD Initiated Contact with Patient 08/12/13 1157     Chief Complaint  Patient presents with  . Fall     (Consider location/radiation/quality/duration/timing/severity/associated sxs/prior Treatment) The history is provided by the patient and medical records.   This is a 68 year old female with extensive past medical history including hypertension, diabetes, coronary artery disease, chronic back pain, presenting to the ED for a fall that occurred in the hospital. Patient states she was on her way to her cardiac rehabilitation appointment this morning which he stopped use the bathroom. She states she hung her purse on the back of the door and backed up to sit down on the toilet, but then realized the toilet across the room. She states she fell onto her right side with impact on her right lower back, shoulder, and right side of her head. She denies loss of consciousness. Patient was able to get off the floor and ambulate on her own after fall.  She states she occasionally walks with a walker for extra stability, but was not using it at time of fall. Patient is on chronic Coumadin therapy for atrial fibrillation.  Pt states the majority of her pain is in her right lower back. She has had 3 lumbar spinal fusions in the past, none recently.  She states she initially had a headache after fall, but this has since resolved. Also note some soreness in her right shoulder, states unsure if it is related to her fibromyalgia or from her fall. She denies any numbness or paresthesias of extremities. No loss of bowel or bladder control.  VS stable on arrival.  Past Medical History  Diagnosis Date  . CAD (coronary artery disease)   . PAF (paroxysmal atrial fibrillation)   . Hypertension   . Hyperlipidemia   . Hypokalemia   . Diabetes mellitus   . Depression   . Lumbar back pain   . LBBB (left bundle branch block)   . Headache(784.0)   .  Lymphadenitis   . Arthritis   . Bursitis   . Chronic anticoagulation     on coumadin  . Heart palpitations   . Diastolic dysfunction     per echo in October 2012 with EF 50 to 55%  . 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 GERD (gastroesophageal reflux disease) 10/23/2003  . Fibromyalgia   . Blood transfusion without reported diagnosis    Past Surgical History  Procedure Laterality Date  . Angioplasty  laminectomy  . Lumbar laminectomy      x3  . Coronary artery bypass graft      LIMA to LAD   . Abdominal hysterectomy    . Cholecystectomy    . Tonsillectomy    . Knee arthroscopy    . Sphincterotomy     Family History  Problem Relation Age of Onset  . Heart attack Father   . Heart disease Father   . Kidney disease    . Stroke    . Arthritis    . Hypertension    . Diabetes    . Stroke Mother   . Colon cancer Paternal Uncle    History  Substance Use Topics  . Smoking status: Former Smoker    Quit date: 03/07/2001  . Smokeless tobacco: Never Used  . Alcohol Use: No   OB History   Grav Para Term Preterm Abortions TAB SAB Ect Mult Living  Review of Systems  Musculoskeletal: Positive for arthralgias and back pain.  All other systems reviewed and are negative.     Allergies  Metformin and related; Cleocin; Codeine; Doxycycline hyclate; Macrolides and ketolides; Morphine; Pentazocine lactate; Vibramycin; and Sulfonamide derivatives  Home Medications   Prior to Admission medications   Medication Sig Start Date End Date Taking? Authorizing Provider  ALPRAZolam Duanne Moron) 1 MG tablet Take 1 mg by mouth 3 (three) times daily as needed for anxiety.    Historical Provider, MD  atenolol (TENORMIN) 100 MG tablet Take 100 mg by mouth every morning.    Historical Provider, MD  atorvastatin (LIPITOR) 20 MG tablet TAKE 2 TABLETS BY MOUTH DAILY    Ricard Dillon, MD  clotrimazole-betamethasone (LOTRISONE) cream Apply 1 application topically daily as needed  (for itching).    Historical Provider, MD  cyclobenzaprine (FEXMID) 7.5 MG tablet Take 1 tablet (7.5 mg total) by mouth 3 (three) times daily as needed for muscle spasms. 05/17/13   Lucretia Kern, DO  furosemide (LASIX) 20 MG tablet Take 20-40 mg by mouth See admin instructions. Takes 20mg  daily, but can up addt'l 20mg  as needed    Historical Provider, MD  gabapentin (NEURONTIN) 300 MG capsule TAKE ONE CAPSULE BY MOUTH THREE TIMES DAILY    Ricard Dillon, MD  HYDROcodone-acetaminophen (NORCO) 10-325 MG per tablet Take 1 tablet by mouth every 4 (four) hours as needed. 08/09/13   Ricard Dillon, MD  insulin detemir (LEVEMIR) 100 UNIT/ML injection Inject 50 Units into the skin 2 (two) times daily.    Historical Provider, MD  Levomilnacipran HCl ER (FETZIMA) 40 MG CP24 Take 1 capsule by mouth daily. 05/07/12   Ricard Dillon, MD  Liraglutide 18 MG/3ML SOPN Inject under skin at 1.8 mg daily. 05/03/13   Philemon Kingdom, MD  metFORMIN (GLUCOPHAGE-XR) 500 MG 24 hr tablet Take 1,000 mg by mouth 2 (two) times daily.    Historical Provider, MD  nitroGLYCERIN (NITROSTAT) 0.4 MG SL tablet Place 1 tablet (0.4 mg total) under the tongue every 5 (five) minutes as needed. For chest pain 07/24/12   Burnell Blanks, MD  ONE TOUCH ULTRA TEST test strip TEST BLOOD SUGAR SIX TIMES A DAY AS DIRECTED    Ricard Dillon, MD  potassium chloride SA (K-DUR,KLOR-CON) 20 MEQ tablet TAKE 1 TABLET BY MOUTH TWICE DAILY    Sherren Mocha, MD  spironolactone (ALDACTONE) 25 MG tablet TAKE 1 TABLET BY MOUTH TWICE DAILY    Ricard Dillon, MD  telmisartan (MICARDIS) 40 MG tablet Take 40 mg by mouth daily.    Historical Provider, MD  warfarin (COUMADIN) 6 MG tablet Take 3-6 mg by mouth daily. Takes 6mg  on mondays and thursdays Takes 3 mg all other days    Historical Provider, MD   BP 121/75  Pulse 92  Temp(Src) 97.7 F (36.5 C) (Oral)  Resp 17  Ht 5\' 2"  (1.575 m)  Wt 228 lb (103.42 kg)  BMI 41.69 kg/m2  SpO2 96%  Physical Exam    Nursing note and vitals reviewed. Constitutional: She is oriented to person, place, and time. She appears well-developed and well-nourished.  HENT:  Head: Normocephalic and atraumatic.  Mouth/Throat: Uvula is midline, oropharynx is clear and moist and mucous membranes are normal.  No visible signs of head trauma  Eyes: Conjunctivae and EOM are normal. Pupils are equal, round, and reactive to light.  Neck: Normal range of motion.  Cardiovascular: Normal rate, regular rhythm and normal heart  sounds.   Pulmonary/Chest: Effort normal and breath sounds normal. No respiratory distress. She has no wheezes.  Abdominal: Soft. Bowel sounds are normal. There is no tenderness. There is no guarding.  Musculoskeletal: Normal range of motion.       Right shoulder: She exhibits tenderness.       Lumbar back: She exhibits tenderness and pain. She exhibits no bony tenderness and no spasm.       Back:       Arms: Lumbar spine with tenderness of right paraspinal region; well healed midline lumbar incision; no midline tenderness, step-off, or deformities; full ROM maintained without difficulty; sensation intact BLE Right shoulder with some tenderness along anterior aspect, no gross bony deformities or signs of dislocation; full range of motion maintained without difficulty; strong radial pulse and cap refill; normal strength   Neurological: She is alert and oriented to person, place, and time.  AAOx3, answering questions appropriately; equal strength UE and LE bilaterally; CN grossly intact; moves all extremities appropriately without ataxia; no focal neuro deficits or facial asymmetry appreciated  Skin: Skin is warm and dry.  Psychiatric: She has a normal mood and affect.    ED Course  Procedures (including critical care time) Labs Review Labs Reviewed - No data to display  Imaging Review Dg Lumbar Spine Complete  08/12/2013   CLINICAL DATA:  Fall.  Low back injury and pain.  EXAM: LUMBAR SPINE -  COMPLETE 4+ VIEW  COMPARISON:  None.  FINDINGS: There is no evidence of lumbar spine fracture.  Ray cage fusion is seen at L4-5. Mild to moderate degenerative disc disease is seen at all other lumbar levels. Bilateral facet DJD is also demonstrated which is most severe at L3-4. Mild degenerative retrolisthesis is seen at L3-4 measuring approximately 3 4 mm.  IMPRESSION: No acute findings.  Lumbar spondylosis, with grade 1 degenerative anterolisthesis at L3-4. Prior ray cage fusion at L4-5.   Electronically Signed   By: Earle Gell M.D.   On: 08/12/2013 14:14   Dg Shoulder Right  08/12/2013   CLINICAL DATA:  Fall with right shoulder pain.  EXAM: RIGHT SHOULDER - 2+ VIEW  COMPARISON:  None.  FINDINGS: No fracture or dislocation. Degenerative changes are seen in the acromioclavicular and glenohumeral joints. Visualized portion of the right chest is unremarkable.  IMPRESSION: Degenerative changes in the right shoulder without acute finding.   Electronically Signed   By: Lorin Picket M.D.   On: 08/12/2013 13:48   Ct Head Wo Contrast  08/12/2013   CLINICAL DATA:  Status post fall.  Patient on Coumadin.  EXAM: CT HEAD WITHOUT CONTRAST  TECHNIQUE: Contiguous axial images were obtained from the base of the skull through the vertex without intravenous contrast.  COMPARISON:  08/23/2011  FINDINGS: Ventricles are normal in configuration. There is ventricular and sulcal enlargement reflecting mild atrophy.  No parenchymal masses or mass effect. Mild periventricular white matter hypoattenuation is noted consistent with chronic microvascular ischemic change. There is no evidence of a recent infarct.  There are no extra-axial masses or abnormal fluid collections.  No intracranial hemorrhage.  Right maxillary sinus is mostly opacified. Remaining visualized sinuses are clear as are the mastoid air cells. No skull fracture.  IMPRESSION: 1. No acute intracranial abnormalities. 2. Mild atrophy and chronic microvascular ischemic  change. 3. Right maxillary sinus disease with the sinus cavity mostly opacified.   Electronically Signed   By: Lajean Manes M.D.   On: 08/12/2013 13:57     EKG  Interpretation None      MDM   Final diagnoses:  Fall  Back pain   68 year old female status post fall in the hospital this morning. She complains of pain mostly in her right lower back, but does have some pain in her right shoulder and along the right side of her head. She is on chronic Coumadin therapy for AFIB.  Patient is baseline oriented and neurologically intact.  Will obtain CT head, plain films of shoulder and lumbar spine.  Imaging negative for acute findings. Patient was treated with Norco in the ED with significant improvement of her pain. She states she has norco at home that she will continue taking PRN.  Pt remains neurologically intact and will be discharged home.  Recommended that patient use her walker for the next few days to prevent her current falls. She will followup with her primary care physician.  Discussed plan with patient, he/she acknowledged understanding and agreed with plan of care.  Return precautions given for new or worsening symptoms.  Larene Pickett, PA-C 08/12/13 [phone]

## 2013-08-12 NOTE — Discharge Instructions (Signed)
Continue taking your home pain medications as needed. Use walker for the next few days to steady yourself and prevent further falls. Follow-up with your primary care physician. Return to the ED for new concerns.

## 2013-08-12 NOTE — ED Notes (Signed)
MD at bedside./ PA

## 2013-08-13 ENCOUNTER — Telehealth (HOSPITAL_COMMUNITY): Payer: Self-pay | Admitting: *Deleted

## 2013-08-13 NOTE — Telephone Encounter (Signed)
Noted  

## 2013-08-13 NOTE — Telephone Encounter (Signed)
Alisha noted

## 2013-08-14 ENCOUNTER — Other Ambulatory Visit: Payer: Self-pay | Admitting: *Deleted

## 2013-08-14 ENCOUNTER — Encounter (HOSPITAL_COMMUNITY): Payer: Self-pay

## 2013-08-14 ENCOUNTER — Ambulatory Visit: Payer: Medicare Other | Admitting: Internal Medicine

## 2013-08-14 NOTE — ED Provider Notes (Signed)
Medical screening examination/treatment/procedure(s) were performed by non-physician practitioner and as supervising physician I was immediately available for consultation/collaboration.  Leota Jacobsen, MD 08/14/13 [phone]

## 2013-08-16 ENCOUNTER — Ambulatory Visit: Payer: Medicare Other | Admitting: Internal Medicine

## 2013-08-19 ENCOUNTER — Encounter (HOSPITAL_COMMUNITY)
Admission: RE | Admit: 2013-08-19 | Discharge: 2013-08-19 | Disposition: A | Discharge status: Home / Self Care | Payer: Self-pay | Source: Ambulatory Visit | Attending: Cardiovascular Disease | Admitting: Cardiovascular Disease

## 2013-08-20 ENCOUNTER — Other Ambulatory Visit: Payer: Self-pay | Admitting: Internal Medicine

## 2013-08-20 ENCOUNTER — Encounter: Payer: Self-pay | Admitting: Internal Medicine

## 2013-08-20 ENCOUNTER — Ambulatory Visit (INDEPENDENT_AMBULATORY_CARE_PROVIDER_SITE_OTHER): Payer: Medicare Other | Admitting: General Practice

## 2013-08-20 DIAGNOSIS — I4891 Unspecified atrial fibrillation: Secondary | ICD-10-CM

## 2013-08-20 DIAGNOSIS — Z7901 Long term (current) use of anticoagulants: Secondary | ICD-10-CM

## 2013-08-20 DIAGNOSIS — Z5181 Encounter for therapeutic drug level monitoring: Secondary | ICD-10-CM

## 2013-08-20 LAB — POCT INR: INR: 1.9

## 2013-08-20 MED ORDER — ALPRAZOLAM 1 MG PO TABS
1.0000 mg | ORAL_TABLET | Freq: Three times a day (TID) | ORAL | Status: DC | PRN
Start: 2013-08-20 — End: 2013-10-14

## 2013-08-20 NOTE — Progress Notes (Signed)
Pre visit review using our clinic review tool, if applicable. No additional management support is needed unless otherwise documented below in the visit note. 

## 2013-08-21 ENCOUNTER — Encounter (HOSPITAL_COMMUNITY)
Admission: RE | Admit: 2013-08-21 | Discharge: 2013-08-21 | Disposition: A | Discharge status: Home / Self Care | Payer: Self-pay | Source: Ambulatory Visit | Attending: Cardiovascular Disease | Admitting: Cardiovascular Disease

## 2013-08-23 ENCOUNTER — Encounter (HOSPITAL_COMMUNITY)
Admission: RE | Admit: 2013-08-23 | Discharge: 2013-08-23 | Disposition: A | Discharge status: Home / Self Care | Payer: Self-pay | Source: Ambulatory Visit | Attending: Cardiovascular Disease | Admitting: Cardiovascular Disease

## 2013-08-26 ENCOUNTER — Encounter (HOSPITAL_COMMUNITY)
Admission: RE | Admit: 2013-08-26 | Discharge: 2013-08-26 | Disposition: A | Discharge status: Home / Self Care | Payer: Self-pay | Source: Ambulatory Visit | Attending: Cardiovascular Disease | Admitting: Cardiovascular Disease

## 2013-08-28 ENCOUNTER — Encounter (HOSPITAL_COMMUNITY)
Admission: RE | Admit: 2013-08-28 | Discharge: 2013-08-28 | Disposition: A | Discharge status: Home / Self Care | Payer: Self-pay | Source: Ambulatory Visit | Attending: Cardiovascular Disease | Admitting: Cardiovascular Disease

## 2013-08-30 ENCOUNTER — Encounter (HOSPITAL_COMMUNITY): Payer: Self-pay

## 2013-08-30 ENCOUNTER — Telehealth: Payer: Self-pay | Admitting: Internal Medicine

## 2013-08-30 NOTE — Telephone Encounter (Signed)
Per Dr. Arnoldo Morale pt should not wait for 2 weeks.  Pt should be see in the clinic on Monday with Padonda.  Called and spoke with [facility] and it was ok to put pt on schedule for 09/02/13.  Called and spoke with pt and pt is aware.  Pt will arrive at 1:30 pm. Advised pt that if symptoms worsen then pt needed to be seen in ED.

## 2013-08-30 NOTE — Telephone Encounter (Signed)
Patient Information:  Caller Name: Alishba  Phone: [phone]  Patient: Kristen [name], Kristen [name]  Gender: Female  DOB: [birth year]  Age: 68 Years  PCP: Benay Pillow (Adults only, leaving end of July 2015)  Office Follow Up:  Does the office need to follow up with this patient?: Yes  Instructions For The Office: See RN Note  RN Note:  Report called to [place] who advises she can schedule an appointment for early next week per protocol or she can send the report as a message to a medical provider for clinical advice. Will send to medical provider to advise given patient never followed up with PCP after her ED eval for fall with injury to head while on Coumadin. PLEASE ADVISE AND FOLLOW UP WITH PATIENT. THANK YOU.  Symptoms  Reason For Call & Symptoms: While at Cardiac Rehab "about 2 weeks ago" patient fell in bathroom and hit head. Patient was sent to ED by Cardiac Rehab staff due to falling and hitting head and specific concern related to this accident occurring while on Coumadin therapy. Patient was instructed to follow up with PCP though she never did. Patient now calls stating she is concerned that she has been having TIAs all along and would like to follow up with a doctor on the CT scans performed the day of her fall. Patient's concern related to TIAs is a result of patient having episodes of feeling "swimmy headed or woozy feeling for a second and then it goes away." Patient also reports having difficulty with concentration. Asymptomatic now and reports these symptoms have been going on prior to her fall. Patient not concerned that dizziness is related to medication therapy or her diabetes. Patient does admit to increased stress in life right now though does not attribute to dizziness spells. Patient states she is in no emergent situation now but would like to follow up with a doctor. Patient reports she is about to board a bus home from cardiac rehab and requests a call back after  2:30pm.  Reviewed Health History In EMR: Yes  Reviewed Medications In EMR: Yes  Reviewed Allergies In EMR: Yes  Reviewed Surgeries / Procedures: Yes  Date of Onset of Symptoms: Unknown  Guideline(s) Used:  Dizziness  Disposition Per Guideline:   See Within 2 Weeks in Office  Reason For Disposition Reached:   Dizziness not present now, but is a chronic symptom (recurrent or ongoing AND lasting > 4 weeks)  Advice Given:  N/A  Patient Will Follow Care Advice:  YES

## 2013-09-02 ENCOUNTER — Encounter (HOSPITAL_COMMUNITY): Payer: Self-pay

## 2013-09-02 ENCOUNTER — Other Ambulatory Visit: Payer: Self-pay | Admitting: *Deleted

## 2013-09-02 ENCOUNTER — Ambulatory Visit (INDEPENDENT_AMBULATORY_CARE_PROVIDER_SITE_OTHER): Payer: Medicare Other | Admitting: Family

## 2013-09-02 ENCOUNTER — Encounter: Payer: Self-pay | Admitting: Family

## 2013-09-02 ENCOUNTER — Encounter: Payer: Self-pay | Admitting: Internal Medicine

## 2013-09-02 VITALS — BP 144/92 | HR 87 | Wt 229.0 lb

## 2013-09-02 DIAGNOSIS — J0101 Acute recurrent maxillary sinusitis: Secondary | ICD-10-CM

## 2013-09-02 DIAGNOSIS — J0100 Acute maxillary sinusitis, unspecified: Secondary | ICD-10-CM

## 2013-09-02 DIAGNOSIS — I4891 Unspecified atrial fibrillation: Secondary | ICD-10-CM

## 2013-09-02 MED ORDER — INSULIN DETEMIR 100 UNIT/ML ~~LOC~~ SOLN: 40.0000 [IU] | Freq: Two times a day (BID) | SUBCUTANEOUS | Status: DC

## 2013-09-02 MED ORDER — FLUTICASONE PROPIONATE 50 MCG/ACT NA SUSP: 2.0000 | Freq: Every day | NASAL | Status: DC

## 2013-09-02 MED ORDER — AMOXICILLIN 500 MG PO TABS: 1000.0000 mg | ORAL_TABLET | Freq: Two times a day (BID) | ORAL | Status: AC

## 2013-09-02 NOTE — Progress Notes (Signed)
Pre visit review using our clinic review tool, if applicable. No additional management support is needed unless otherwise documented below in the visit note. 

## 2013-09-02 NOTE — Patient Instructions (Signed)
Sinusitis Sinusitis is redness, soreness, and swelling (inflammation) of the paranasal sinuses. Paranasal sinuses are air pockets within the bones of your face (beneath the eyes, the middle of the forehead, or above the eyes). In healthy paranasal sinuses, mucus is able to drain out, and air is able to circulate through them by way of your nose. However, when your paranasal sinuses are inflamed, mucus and air can become trapped. This can allow bacteria and other germs to grow and cause infection. Sinusitis can develop quickly and last only a short time (acute) or continue over a long period (chronic). Sinusitis that lasts for more than 12 weeks is considered chronic.  CAUSES  Causes of sinusitis include:  Allergies.  Structural abnormalities, such as displacement of the cartilage that separates your nostrils (deviated septum), which can decrease the air flow through your nose and sinuses and affect sinus drainage.  Functional abnormalities, such as when the small hairs (cilia) that line your sinuses and help remove mucus do not work properly or are not present. SYMPTOMS  Symptoms of acute and chronic sinusitis are the same. The primary symptoms are pain and pressure around the affected sinuses. Other symptoms include:  Upper toothache.  Earache.  Headache.  Bad breath.  Decreased sense of smell and taste.  A cough, which worsens when you are lying flat.  Fatigue.  Fever.  Thick drainage from your nose, which often is green and may contain pus (purulent).  Swelling and warmth over the affected sinuses. DIAGNOSIS  Your caregiver will perform a physical exam. During the exam, your caregiver may:  Look in your nose for signs of abnormal growths in your nostrils (nasal polyps).  Tap over the affected sinus to check for signs of infection.  View the inside of your sinuses (endoscopy) with a special imaging device with a light attached (endoscope), which is inserted into your  sinuses. If your caregiver suspects that you have chronic sinusitis, one or more of the following tests may be recommended:  Allergy tests.  Nasal culture--A sample of mucus is taken from your nose and sent to a lab and screened for bacteria.  Nasal cytology--A sample of mucus is taken from your nose and examined by your caregiver to determine if your sinusitis is related to an allergy. TREATMENT  Most cases of acute sinusitis are related to a viral infection and will resolve on their own within 10 days. Sometimes medicines are prescribed to help relieve symptoms (pain medicine, decongestants, nasal steroid sprays, or saline sprays).  However, for sinusitis related to a bacterial infection, your caregiver will prescribe antibiotic medicines. These are medicines that will help kill the bacteria causing the infection.  Rarely, sinusitis is caused by a fungal infection. In theses cases, your caregiver will prescribe antifungal medicine. For some cases of chronic sinusitis, surgery is needed. Generally, these are cases in which sinusitis recurs more than 3 times per year, despite other treatments. HOME CARE INSTRUCTIONS   Drink plenty of water. Water helps thin the mucus so your sinuses can drain more easily.  Use a humidifier.  Inhale steam 3 to 4 times a day (for example, sit in the bathroom with the shower running).  Apply a warm, moist washcloth to your face 3 to 4 times a day, or as directed by your caregiver.  Use saline nasal sprays to help moisten and clean your sinuses.  Take over-the-counter or prescription medicines for pain, discomfort, or fever only as directed by your caregiver. SEEK IMMEDIATE MEDICAL CARE IF:    You have increasing pain or severe headaches.  You have nausea, vomiting, or drowsiness.  You have swelling around your face.  You have vision problems.  You have a stiff neck.  You have difficulty breathing. MAKE SURE YOU:   Understand these  instructions.  Will watch your condition.  Will get help right away if you are not doing well or get worse. Document Released: 02/21/2005 Document Revised: 05/16/2011 Document Reviewed: 03/08/2011 Exit[employer] Patient Information 2015 [employer]. This information is not intended to replace advice given to you by your health care provider. Make sure you discuss any questions you have with your health care provider.  

## 2013-09-02 NOTE — Progress Notes (Signed)
Subjective:    Patient ID: Kristen [name], female    DOB: 1946-02-04, 68 y.o.   MRN: [id number]  HPI  68 year old African American female, nonsmoker, is in today with complaints of right-sided facial swelling and pain ongoing x1 day. Reports having a CT scan done on 08/12/2013 she was in the hospital after a fall that showed sinus inflammation. He denies any cogh or congestion. Has been using nasal saline weekly without much relief.  Review of Systems  Constitutional: Negative.   HENT: Positive for congestion, facial swelling and sinus pressure. Negative for ear discharge, ear pain and sore throat.   Respiratory: Positive for cough. Negative for wheezing.   Cardiovascular: Negative.   Endocrine: Negative.   Musculoskeletal: Negative.   Skin: Negative.   Allergic/Immunologic: Negative.   Neurological: Negative.   Psychiatric/Behavioral: Negative.    Past Medical History  Diagnosis Date  . CAD (coronary artery disease)   . PAF (paroxysmal atrial fibrillation)   . Hypertension   . Hyperlipidemia   . Hypokalemia   . Diabetes mellitus   . Depression   . Lumbar back pain   . LBBB (left bundle branch block)   . Headache(784.0)   . Lymphadenitis   . Arthritis   . Bursitis   . Chronic anticoagulation     on coumadin  . Heart palpitations   . Diastolic dysfunction     per echo in October 2012 with EF 50 to 55%  . 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 GERD (gastroesophageal reflux disease) 10/23/2003  . Fibromyalgia   . Blood transfusion without reported diagnosis     History   Social History  . Marital Status: Legally Separated    Spouse Name: N/A    Number of Children: N/A  . Years of Education: N/A   Occupational History  . Not on file.   Social History Main Topics  . Smoking status: Former Smoker    Quit date: 03/07/2001  . Smokeless tobacco: Never Used  . Alcohol Use: No  . Drug Use: No  . Sexual Activity: Not on file   Other Topics Concern  .  Not on file   Social History Narrative   Lives locally, has help available if needed.    Past Surgical History  Procedure Laterality Date  . Angioplasty  laminectomy  . Lumbar laminectomy      x3  . Coronary artery bypass graft      LIMA to LAD   . Abdominal hysterectomy    . Cholecystectomy    . Tonsillectomy    . Knee arthroscopy    . Sphincterotomy      Family History  Problem Relation Age of Onset  . Heart attack Father   . Heart disease Father   . Kidney disease    . Stroke    . Arthritis    . Hypertension    . Diabetes    . Stroke Mother   . Colon cancer Paternal Uncle     Allergies  Allergen Reactions  . Metformin And Related Other (See Comments)    Must take XR form only, cannot tolerate Regular release metformin  . Cleocin [Clindamycin Hcl] Diarrhea  . Codeine Itching  . Doxycycline Hyclate Diarrhea  . Macrolides And Ketolides Diarrhea  . Morphine Hives  . Pentazocine Lactate Itching and Nausea And Vomiting  . Vibramycin [Doxycycline Calcium] Diarrhea and Rash  . Sulfonamide Derivatives Rash    Current Outpatient Prescriptions on File Prior to  Visit  Medication Sig Dispense Refill  . ALPRAZolam (XANAX) 1 MG tablet Take 1 tablet (1 mg total) by mouth 3 (three) times daily as needed for anxiety.  60 tablet  5  . atenolol (TENORMIN) 100 MG tablet Take 100 mg by mouth every morning.      Marland Kitchen atorvastatin (LIPITOR) 20 MG tablet Take 40 mg by mouth daily.      . clotrimazole-betamethasone (LOTRISONE) cream Apply 1 application topically daily as needed (for itching).      . cyclobenzaprine (FEXMID) 7.5 MG tablet Take 1 tablet (7.5 mg total) by mouth 3 (three) times daily as needed for muscle spasms.  15 tablet  0  . furosemide (LASIX) 20 MG tablet Take 20-40 mg by mouth See admin instructions. Takes 20mg  daily, but can up addt'l 20mg  as needed      . gabapentin (NEURONTIN) 300 MG capsule Take 300 mg by mouth 3 (three) times daily.      Marland Kitchen  HYDROcodone-acetaminophen (NORCO) 10-325 MG per tablet Take 1 tablet by mouth every 4 (four) hours as needed (pain).      . Levomilnacipran HCl ER (FETZIMA) 40 MG CP24 Take 1 capsule by mouth daily.  30 capsule    . Liraglutide (VICTOZA) 18 MG/3ML SOPN Inject 1.8 mg into the skin daily.      . metFORMIN (GLUCOPHAGE-XR) 500 MG 24 hr tablet Take 1,000 mg by mouth 2 (two) times daily.      . nitroGLYCERIN (NITROSTAT) 0.4 MG SL tablet Place 1 tablet (0.4 mg total) under the tongue every 5 (five) minutes as needed. For chest pain  30 tablet  6  . potassium chloride SA (K-DUR,KLOR-CON) 20 MEQ tablet Take 20 mEq by mouth 2 (two) times daily.      Marland Kitchen spironolactone (ALDACTONE) 25 MG tablet Take 25 mg by mouth 2 (two) times daily.      Marland Kitchen telmisartan (MICARDIS) 40 MG tablet Take 40 mg by mouth daily.      Marland Kitchen warfarin (COUMADIN) 6 MG tablet Take 3-6 mg by mouth daily. Takes 6mg  on mondays and thursdays Takes 3 mg all other days       No current facility-administered medications on file prior to visit.    BP 144/92  Pulse 87  Wt 229 lb (103.874 kg)  SpO2 99%chart    Objective:   Physical Exam  Constitutional: She is oriented to person, place, and time. She appears well-developed and well-nourished.  HENT:  Right Ear: External ear normal.  Left Ear: External ear normal.  Maxillary sinus tenderness to palpation. No obvious facial swelling.   Neck: Normal range of motion. Neck supple.  Cardiovascular: Normal rate, regular rhythm and normal heart sounds.   Pulmonary/Chest: Effort normal and breath sounds normal.  Neurological: She is alert and oriented to person, place, and time.  Skin: Skin is warm and dry.  Psychiatric: She has a normal mood and affect.          Assessment & Plan:   Problem List Items Addressed This Visit   None    Visit Diagnoses   Acute recurrent maxillary sinusitis    -  Primary    Relevant Medications       fluticasone (FLONASE) nasal spray       amoxicillin  (AMOXIL) tablet     Call the office with any questions or concerns.

## 2013-09-04 ENCOUNTER — Encounter (HOSPITAL_COMMUNITY): Payer: Medicare Other

## 2013-09-04 DIAGNOSIS — E785 Hyperlipidemia, unspecified: Secondary | ICD-10-CM | POA: Insufficient documentation

## 2013-09-04 DIAGNOSIS — Z5189 Encounter for other specified aftercare: Secondary | ICD-10-CM | POA: Insufficient documentation

## 2013-09-04 DIAGNOSIS — I10 Essential (primary) hypertension: Secondary | ICD-10-CM | POA: Insufficient documentation

## 2013-09-04 DIAGNOSIS — I2510 Atherosclerotic heart disease of native coronary artery without angina pectoris: Secondary | ICD-10-CM | POA: Insufficient documentation

## 2013-09-06 ENCOUNTER — Other Ambulatory Visit: Payer: Self-pay | Admitting: Internal Medicine

## 2013-09-09 ENCOUNTER — Other Ambulatory Visit: Payer: Self-pay | Admitting: General Practice

## 2013-09-09 ENCOUNTER — Ambulatory Visit: Payer: Medicare Other | Admitting: Internal Medicine

## 2013-09-09 ENCOUNTER — Encounter (HOSPITAL_COMMUNITY): Payer: Self-pay

## 2013-09-09 MED ORDER — WARFARIN SODIUM 6 MG PO TABS: ORAL_TABLET | ORAL | Status: DC

## 2013-09-11 ENCOUNTER — Other Ambulatory Visit: Payer: Self-pay | Admitting: Internal Medicine

## 2013-09-11 ENCOUNTER — Encounter (HOSPITAL_COMMUNITY): Payer: Self-pay

## 2013-09-13 ENCOUNTER — Encounter (HOSPITAL_COMMUNITY): Payer: Self-pay

## 2013-09-13 ENCOUNTER — Encounter: Payer: Self-pay | Admitting: Internal Medicine

## 2013-09-13 ENCOUNTER — Encounter: Payer: Self-pay | Admitting: Family

## 2013-09-13 MED ORDER — HYDROCODONE-ACETAMINOPHEN 10-325 MG PO TABS: 1.0000 | ORAL_TABLET | ORAL | Status: DC | PRN

## 2013-09-16 ENCOUNTER — Encounter (HOSPITAL_COMMUNITY)
Admission: RE | Admit: 2013-09-16 | Discharge: 2013-09-16 | Disposition: A | Discharge status: Home / Self Care | Payer: Self-pay | Source: Ambulatory Visit | Attending: Cardiovascular Disease | Admitting: Cardiovascular Disease

## 2013-09-18 ENCOUNTER — Other Ambulatory Visit: Payer: Self-pay | Admitting: *Deleted

## 2013-09-18 ENCOUNTER — Telehealth: Payer: Self-pay | Admitting: Cardiovascular Disease

## 2013-09-18 ENCOUNTER — Encounter (HOSPITAL_COMMUNITY)
Admission: RE | Admit: 2013-09-18 | Discharge: 2013-09-18 | Disposition: A | Discharge status: Home / Self Care | Payer: Self-pay | Source: Ambulatory Visit | Attending: Cardiovascular Disease | Admitting: Cardiovascular Disease

## 2013-09-18 DIAGNOSIS — I4891 Unspecified atrial fibrillation: Secondary | ICD-10-CM

## 2013-09-18 MED ORDER — HYDROCODONE-ACETAMINOPHEN 10-325 MG PO TABS: 1.0000 | ORAL_TABLET | ORAL | Status: DC | PRN

## 2013-09-18 NOTE — Telephone Encounter (Signed)
Recommend that she come in for an EKG. Depending on findings, could consider 48 hour Holter.  thx

## 2013-09-18 NOTE — Telephone Encounter (Signed)
Pt states that for the last 2 to 3 weeks her heart rate has been in the low 100's 105 to 106 beats/ minute at rest. Pt knows that she has a history of A-fib, and sometimes she feels when she is in A-fib sometime she doesn't. Pt said that she used Flonase on 6/29 th one time only, because she woke up from a nap with  a headache. she sometimes  wakes up in the middle of the day with a increased heart rate. Pt denies any other symptoms. Today her heart rate is in the high 80's. Pt would like to know if this is normal or she needs to be seen.

## 2013-09-18 NOTE — Telephone Encounter (Signed)
New message      Pt states her heart rate is a little over 100.  She is tired but no other symptoms.  Pt used flonase for her sinus--took a nap--when she woke up she had a really bad headache.  Normally she uses saline only.  Is this ok from a cardiac side.  Pt knows Ander Purpura is out today.  OK to wait until tomorrow.

## 2013-09-19 NOTE — Telephone Encounter (Signed)
I spoke with the pt and she has transportation issues and now relies on [facility].  The pt is scheduled to go to cardiac rehab tomorrow at 11:15. I will try to arrange for an EKG to be performed at rehab and if this cannot be done there then I will see if the EKG department can perform EKG.

## 2013-09-20 ENCOUNTER — Other Ambulatory Visit: Payer: Self-pay

## 2013-09-20 ENCOUNTER — Encounter (HOSPITAL_COMMUNITY)
Admission: RE | Admit: 2013-09-20 | Discharge: 2013-09-20 | Disposition: A | Discharge status: Home / Self Care | Payer: Self-pay | Source: Ambulatory Visit | Attending: Cardiovascular Disease | Admitting: Cardiovascular Disease

## 2013-09-20 ENCOUNTER — Ambulatory Visit (HOSPITAL_COMMUNITY)
Admission: RE | Admit: 2013-09-20 | Discharge: 2013-09-20 | Disposition: A | Discharge status: Home / Self Care | Payer: Medicare Other | Source: Ambulatory Visit | Attending: Cardiovascular Disease | Admitting: Cardiovascular Disease

## 2013-09-20 DIAGNOSIS — I4891 Unspecified atrial fibrillation: Secondary | ICD-10-CM | POA: Insufficient documentation

## 2013-09-20 NOTE — Telephone Encounter (Signed)
EKG from Card[facility] Rehab scanned into the pt's chart for Dr Burt Knack to review. Will await further orders for possible 48 hour holter monitor.

## 2013-09-20 NOTE — Telephone Encounter (Signed)
I spoke with Verdis Frederickson at Card[facility] Rehab and they will obtain EKG today.  Order placed in [place].

## 2013-09-23 ENCOUNTER — Encounter (HOSPITAL_COMMUNITY)
Admission: RE | Admit: 2013-09-23 | Discharge: 2013-09-23 | Disposition: A | Discharge status: Home / Self Care | Payer: Self-pay | Source: Ambulatory Visit | Attending: Cardiovascular Disease | Admitting: Cardiovascular Disease

## 2013-09-24 ENCOUNTER — Ambulatory Visit (INDEPENDENT_AMBULATORY_CARE_PROVIDER_SITE_OTHER): Payer: Medicare Other | Admitting: General Practice

## 2013-09-24 DIAGNOSIS — I4891 Unspecified atrial fibrillation: Secondary | ICD-10-CM

## 2013-09-24 DIAGNOSIS — Z5181 Encounter for therapeutic drug level monitoring: Secondary | ICD-10-CM

## 2013-09-24 LAB — POCT INR: INR: 2.2

## 2013-09-24 NOTE — Telephone Encounter (Signed)
ekg shows sinus rhythm. If persistent symptoms, would check event recorder to evaluate for atrial fibrillation. thx

## 2013-09-24 NOTE — Progress Notes (Signed)
Pre visit review using our clinic review tool, if applicable. No additional management support is needed unless otherwise documented below in the visit note. 

## 2013-09-25 ENCOUNTER — Encounter (HOSPITAL_COMMUNITY): Payer: Self-pay | Admitting: Cardiovascular Disease

## 2013-09-25 ENCOUNTER — Encounter (HOSPITAL_COMMUNITY): Payer: Self-pay

## 2013-09-26 NOTE — Telephone Encounter (Signed)
Message sent to the pt through My Chart: Dr Burt Knack also reviewed the EKG performed at cardiac rehab. This showed normal sinus rhythm, pulse 92. He felt that if your heart rate remained elevated at home and you continued to feel tired then we could pursue an event recorder to evaluate for atrial fibrillation. Please let me know if you are interested in having this test.

## 2013-09-27 ENCOUNTER — Encounter: Payer: Self-pay | Admitting: Internal Medicine

## 2013-09-27 ENCOUNTER — Ambulatory Visit (INDEPENDENT_AMBULATORY_CARE_PROVIDER_SITE_OTHER): Payer: Medicare Other | Admitting: Internal Medicine

## 2013-09-27 ENCOUNTER — Encounter (HOSPITAL_COMMUNITY)
Admission: RE | Admit: 2013-09-27 | Discharge: 2013-09-27 | Disposition: A | Discharge status: Home / Self Care | Payer: Self-pay | Source: Ambulatory Visit | Attending: Cardiovascular Disease | Admitting: Cardiovascular Disease

## 2013-09-27 VITALS — BP 112/70 | HR 91 | Temp 98.2°F | Resp 12 | Wt 227.0 lb

## 2013-09-27 DIAGNOSIS — E1159 Type 2 diabetes mellitus with other circulatory complications: Secondary | ICD-10-CM

## 2013-09-27 DIAGNOSIS — E1151 Type 2 diabetes mellitus with diabetic peripheral angiopathy without gangrene: Secondary | ICD-10-CM

## 2013-09-27 DIAGNOSIS — I798 Other disorders of arteries, arterioles and capillaries in diseases classified elsewhere: Secondary | ICD-10-CM

## 2013-09-27 LAB — COMPREHENSIVE METABOLIC PANEL
ALBUMIN: 4.4 g/dL (ref 3.5–5.2)
ALT: 15 U/L (ref 0–35)
AST: 17 U/L (ref 0–37)
Alkaline Phosphatase: 52 U/L (ref 39–117)
BUN: 20 mg/dL (ref 6–23)
CHLORIDE: 106 meq/L (ref 96–112)
CO2: 22 meq/L (ref 19–32)
Calcium: 10.2 mg/dL (ref 8.4–10.5)
Creatinine, Ser: 1 mg/dL (ref 0.4–1.2)
GFR: 72.23 mL/min (ref >60.00)
GLUCOSE: 103 mg/dL — AB (ref 70–99)
Potassium: 4.2 mEq/L (ref 3.5–5.1)
SODIUM: 139 meq/L (ref 135–145)
TOTAL PROTEIN: 8.1 g/dL (ref 6.0–8.3)
Total Bilirubin: 0.6 mg/dL (ref 0.2–1.2)

## 2013-09-27 LAB — HEMOGLOBIN A1C: Hgb A1c MFr Bld: 7 % — ABNORMAL HIGH (ref 4.6–6.5)

## 2013-09-27 LAB — TSH: TSH: 0.45 u[IU]/mL (ref 0.35–4.50)

## 2013-09-27 LAB — T4, FREE: Free T4: 0.73 ng/dL (ref 0.60–1.60)

## 2013-09-27 MED ORDER — INSULIN DETEMIR 100 UNIT/ML ~~LOC~~ SOLN: 30.0000 [IU] | Freq: Two times a day (BID) | SUBCUTANEOUS | Status: DC

## 2013-09-27 NOTE — Patient Instructions (Signed)
-   Please decrease the Levemir to 30units 2x a day  - Continue Metformin XR 1000 mg 2x a day  - Continue Victoza 1.2 mg - increase to 1.5 mg only if sugars in am >130 or sugars after meals >160 consistently (1.2 mg + 5 clicks)  Please stop at the lab.  Please return in 3 month with your sugar log.

## 2013-09-27 NOTE — Progress Notes (Signed)
Patient ID: Kristen [name], female   DOB: Jan 30, 1946, 68 y.o.   MRN: [id number]  HPI: Kristen [name] is a 68 y.o.-year-old female, returning for DM2, dx 2008, insulin-dependent, uncontrolled, with complications (CAD, PAD, h/o CVD, peripheral neuropathy). Last visit 3.5 mo ago.  Last hemoglobin A1c was: Lab Results  Component Value Date   HGBA1C 8.7* 05/03/2013   HGBA1C 7.2* 11/14/2012   HGBA1C 7.6* 08/30/2012    Pt is on a regimen of: - Levemir pen 40 << 50 units bid - gets the med directly from [facility] - Metformin XR 1000 mg bid - Victoza 1.2 mg - added 03/2013 She was on Bydureon in the past, then also on Novolog 60-70-75.  She tried Januvia before.   Pt checks her sugars 2-3x a day (no log) and they are still good  - she tells me that her sugars are good except when she binges: - am: 90-115 >> 89-131 >> 90-100 >> 150-170 >> 70-120 (135) >> 90-120 - 2h after b'fast: 116 >> 96-140 - before lunch: 120-140 >> 82-125 >> 79 >> n/c >> 100-140 >> 103-199 - before dinner: 99-168 >> n/c >> 80-120 >> 93-116 - after dinner: 120-130 >> 250-300s, but now 180s >> 90-150s (higher with bread) >> 120-188 - bedtime: 110-120 >> 91-123, once 211 >> 90-100 >> n/c >> 130s No recent lows;  she has hypoglycemia awareness at 100. Highest 200's - few times, but not repeated with another meter.   Pt does not have chronic kidney disease, last BUN/creatinine was:  Lab Results  Component Value Date   BUN 14 04/13/2013   CREATININE 0.86 04/13/2013  On Telmisartan. Last set of lipids: Lab Results  Component Value Date   CHOL 119 12/07/2012   HDL 36* 12/07/2012   LDLCALC 60 12/07/2012   LDLDIRECT 116.0 11/28/2008   TRIG 115 12/07/2012   CHOLHDL 3.3 12/07/2012  On Lipitor. Pt's last eye exam was last year. Reportingly no DR.  Denies numbness and tingling in her legs.  She also has a history of hypertension, paroxysmal A. Fib-on anticoagulation, She had a stress test on 11/14/2012 b/c palpitations  and tachycardia (on Holter monitor) >> cath on 11/30/2012, depression, osteoarthritis - status post arthroscopic knee surgeries x2, status post lumbar laminectomy x3, hyperlipidemia, morbid obesity, chronic lymphadenitis, GERD, hypothyroidism, headaches, fibromyalgia.  ROS: Constitutional: + fatigue, no weight loss, + hot flashes Eyes: + blurry vision, no xerophthalmia ENT: no sore throat, no nodules palpated in throat, no dysphagia/odynophagia, no hoarseness Cardiovascular: no CP/palpitations/+ SOB/no leg swelling Respiratory: + cough/+ SOB with activity  Gastrointestinal: no vomiting/diarrhea/constipation, but has nausea Musculoskeletal: no muscle aches/+ joint aches Skin: no rashes Neurological: no tremors/numbness/tingling/dizziness  I reviewed pt's medications, allergies, PMH, social hx, family hx and no changes required, except as mentioned above.  PE: BP 112/70  Pulse 91  Temp(Src) 98.2 F (36.8 C) (Oral)  Resp 12  Wt 227 lb (102.967 kg)  SpO2 96% Wt Readings from Last 3 Encounters:  09/27/13 227 lb (102.967 kg)  09/02/13 229 lb (103.874 kg)  08/12/13 228 lb (103.42 kg)   Constitutional: overweight, in NAD, full supraclavicular fat pads Eyes: PERRLA, EOMI, no exophthalmos ENT: moist mucous membranes, no thyromegaly, no cervical lymphadenopathy Cardiovascular: RRR, No MRG Respiratory: CTA B Gastrointestinal: abdomen soft, NT, ND, BS+ Musculoskeletal: no deformities, strength intact in all 4 Skin: moist, warm, acanthosis nigricans  ASSESSMENT: 1. DM2, insulin-dependent, uncontrolled, with complications - CAD- Echo 12/2010: EF 50-55%, status post CABG 2004: LIMA to LAD - cards: Dr.  Stuckey. Also has a LBBB.  - PAD - h/o stroke - peripheral neuropathy - on neurontin  PLAN:  1. Patient with a several-year history of diabetes, on high-dose of basal insulin. Her sugars are a little bit higher, but not far from goal - will increase Victoza. Sugars were even higher after  her steroid injections.  - I advised her to Patient Instructions  - Please decrease the Levemir to 30units 2x a day  - Continue Metformin XR 1000 mg 2x a day  - Continue Victoza 1.2 mg - increase to 1.5 mg only if sugars in am >130 or sugars after meals >160 consistently (1.2 mg + 5 clicks) Please stop at the lab. Please return in 3 month with your sugar log.  - check HbA1C today - advised to schedule a new eye exam - given more sugar logs - I will also check a CMP and TSH and free t4 (weight gain) - RTC in 3 mo  Office Visit on 09/27/2013  Component Date Value Ref Range Status  . Hemoglobin A1C 09/27/2013 7.0* 4.6 - 6.5 % Final   Glycemic Control Guidelines for People with Diabetes:Non Diabetic:  <6%Goal of Therapy: <7%Additional Action Suggested:  >8%   . Sodium 09/27/2013 139  135 - 145 mEq/L Final  . Potassium 09/27/2013 4.2  3.5 - 5.1 mEq/L Final  . Chloride 09/27/2013 106  96 - 112 mEq/L Final  . CO2 09/27/2013 22  19 - 32 mEq/L Final  . Glucose, Bld 09/27/2013 103* 70 - 99 mg/dL Final  . BUN 09/27/2013 20  6 - 23 mg/dL Final  . Creatinine, Ser 09/27/2013 1.0  0.4 - 1.2 mg/dL Final  . Total Bilirubin 09/27/2013 0.6  0.2 - 1.2 mg/dL Final  . Alkaline Phosphatase 09/27/2013 52  39 - 117 U/L Final  . AST 09/27/2013 17  0 - 37 U/L Final  . ALT 09/27/2013 15  0 - 35 U/L Final  . Total Protein 09/27/2013 8.1  6.0 - 8.3 g/dL Final  . Albumin 09/27/2013 4.4  3.5 - 5.2 g/dL Final  . Calcium 09/27/2013 10.2  8.4 - 10.5 mg/dL Final  . GFR 09/27/2013 72.23  >60.00 mL/min Final  . TSH 09/27/2013 0.45  0.35 - 4.50 uIU/mL Final  . Free T4 09/27/2013 0.73  0.60 - 1.60 ng/dL Final   HbA1c great! Liver and kidney fxns also good. No hypo-/hyperthyroidism.

## 2013-09-30 ENCOUNTER — Encounter (HOSPITAL_COMMUNITY): Payer: Self-pay

## 2013-10-02 ENCOUNTER — Encounter (HOSPITAL_COMMUNITY): Payer: Self-pay

## 2013-10-04 ENCOUNTER — Encounter (HOSPITAL_COMMUNITY): Payer: Self-pay

## 2013-10-07 ENCOUNTER — Encounter (HOSPITAL_COMMUNITY): Payer: Self-pay | Attending: Cardiovascular Disease

## 2013-10-07 DIAGNOSIS — E785 Hyperlipidemia, unspecified: Secondary | ICD-10-CM | POA: Insufficient documentation

## 2013-10-07 DIAGNOSIS — Z5189 Encounter for other specified aftercare: Secondary | ICD-10-CM | POA: Insufficient documentation

## 2013-10-07 DIAGNOSIS — I2510 Atherosclerotic heart disease of native coronary artery without angina pectoris: Secondary | ICD-10-CM | POA: Insufficient documentation

## 2013-10-07 DIAGNOSIS — I10 Essential (primary) hypertension: Secondary | ICD-10-CM | POA: Insufficient documentation

## 2013-10-08 NOTE — Telephone Encounter (Signed)
The pt viewed her My Chart message on 09/26/13 at 7:28 PM.  The pt has not sent any further responses into our office at this time.   Dr Burt Knack had also received a letter from Dr Hart Robinsons with [facility] inquiring if the pt is a candidate for injection. Per Dr Burt Knack this is okay to do, but, the pt will need lovenox bridge due to PAF and history of stroke.  This information was also sent to the pt through My Chart.

## 2013-10-09 ENCOUNTER — Encounter (HOSPITAL_COMMUNITY): Payer: Self-pay

## 2013-10-11 ENCOUNTER — Encounter: Payer: Self-pay | Admitting: Internal Medicine

## 2013-10-11 ENCOUNTER — Other Ambulatory Visit: Payer: Self-pay | Admitting: Internal Medicine

## 2013-10-11 ENCOUNTER — Encounter (HOSPITAL_COMMUNITY): Payer: Self-pay

## 2013-10-13 ENCOUNTER — Encounter: Payer: Self-pay | Admitting: Internal Medicine

## 2013-10-14 ENCOUNTER — Encounter (HOSPITAL_COMMUNITY): Payer: Self-pay

## 2013-10-14 MED ORDER — ALPRAZOLAM 1 MG PO TABS: 1.0000 mg | ORAL_TABLET | Freq: Three times a day (TID) | ORAL | Status: DC | PRN

## 2013-10-14 MED ORDER — HYDROCODONE-ACETAMINOPHEN 10-325 MG PO TABS: 1.0000 | ORAL_TABLET | ORAL | Status: DC | PRN

## 2013-10-15 ENCOUNTER — Other Ambulatory Visit: Payer: Self-pay

## 2013-10-15 DIAGNOSIS — Z1231 Encounter for screening mammogram for malignant neoplasm of breast: Secondary | ICD-10-CM

## 2013-10-15 DIAGNOSIS — R002 Palpitations: Secondary | ICD-10-CM

## 2013-10-15 DIAGNOSIS — R5383 Other fatigue: Secondary | ICD-10-CM

## 2013-10-15 DIAGNOSIS — I4891 Unspecified atrial fibrillation: Secondary | ICD-10-CM

## 2013-10-16 ENCOUNTER — Encounter (HOSPITAL_COMMUNITY): Payer: Self-pay

## 2013-10-17 ENCOUNTER — Encounter: Payer: Self-pay | Admitting: *Deleted

## 2013-10-17 ENCOUNTER — Encounter (INDEPENDENT_AMBULATORY_CARE_PROVIDER_SITE_OTHER): Payer: Medicare Other

## 2013-10-17 ENCOUNTER — Other Ambulatory Visit: Payer: Self-pay | Admitting: Internal Medicine

## 2013-10-17 DIAGNOSIS — I4891 Unspecified atrial fibrillation: Secondary | ICD-10-CM

## 2013-10-17 DIAGNOSIS — R5383 Other fatigue: Secondary | ICD-10-CM

## 2013-10-17 DIAGNOSIS — R002 Palpitations: Secondary | ICD-10-CM

## 2013-10-17 NOTE — Progress Notes (Unsigned)
Patient ID: Kristen [name], female   DOB: [birth year], 68 y.o.   MRN: [id number] [employer] 30 day cardiac event monitor applied to patient.

## 2013-10-18 ENCOUNTER — Encounter (HOSPITAL_COMMUNITY): Payer: Self-pay

## 2013-10-21 ENCOUNTER — Encounter (HOSPITAL_COMMUNITY): Payer: Self-pay

## 2013-10-22 ENCOUNTER — Ambulatory Visit: Payer: Medicare Other

## 2013-10-23 ENCOUNTER — Encounter (HOSPITAL_COMMUNITY): Payer: Self-pay

## 2013-10-23 ENCOUNTER — Ambulatory Visit (INDEPENDENT_AMBULATORY_CARE_PROVIDER_SITE_OTHER): Payer: Medicare Other | Admitting: *Deleted

## 2013-10-23 DIAGNOSIS — Z5181 Encounter for therapeutic drug level monitoring: Secondary | ICD-10-CM

## 2013-10-23 DIAGNOSIS — I4891 Unspecified atrial fibrillation: Secondary | ICD-10-CM

## 2013-10-23 LAB — POCT INR: INR: 3.4

## 2013-10-25 ENCOUNTER — Encounter (HOSPITAL_COMMUNITY): Payer: Self-pay

## 2013-10-28 ENCOUNTER — Encounter (HOSPITAL_COMMUNITY): Payer: Self-pay

## 2013-10-30 ENCOUNTER — Encounter (HOSPITAL_COMMUNITY): Payer: Self-pay

## 2013-11-01 ENCOUNTER — Encounter (HOSPITAL_COMMUNITY): Payer: Self-pay

## 2013-11-02 ENCOUNTER — Ambulatory Visit: Payer: Medicare Other | Admitting: Family Medicine

## 2013-11-03 ENCOUNTER — Other Ambulatory Visit: Payer: Self-pay | Admitting: Internal Medicine

## 2013-11-04 ENCOUNTER — Encounter (HOSPITAL_COMMUNITY): Payer: Self-pay

## 2013-11-08 ENCOUNTER — Other Ambulatory Visit: Payer: Self-pay | Admitting: Internal Medicine

## 2013-11-12 ENCOUNTER — Encounter: Payer: Self-pay | Admitting: Internal Medicine

## 2013-11-12 MED ORDER — HYDROCODONE-ACETAMINOPHEN 10-325 MG PO TABS: 1.0000 | ORAL_TABLET | ORAL | Status: DC | PRN

## 2013-11-13 ENCOUNTER — Other Ambulatory Visit: Payer: Self-pay | Admitting: *Deleted

## 2013-11-13 ENCOUNTER — Telehealth: Payer: Self-pay | Admitting: Cardiovascular Disease

## 2013-11-13 NOTE — Telephone Encounter (Signed)
Reports yesterday felt "terrible, palpitations, lightheaded, SOB, dizzy" Occurred 3 different times lasting 5-10 minutes each time. Almost went to ER, got nervous, took xanax and night time meds and it resolved. Wants to go to ER if happens again.  Advised her not to go to ER for 5-10 min episodes, as when she gets there-there will likely be no symptoms to treat. Instructed her to go to ER if symptoms would persist or become more severe. Also encouraged her to drink couple extra cups of fluid, that she may have been little dry on lasix.  She did not record these symptoms on her event monitor that she is wearing for 3 more days. Print out of episode monitor report reviewed. She denies any other symptoms and states she is compliant with her medications. Advised her to report these episodes to PCP, and that I will also send message to Dr. Burt Knack to inform. Pt verbalizes understanding of above.

## 2013-11-13 NOTE — Telephone Encounter (Signed)
New problem   Pt stated she had 3 episodes of dizziness while she has on her monitor. Pt would like to know if it starts again does she need to go to ER. Patient would like to be advise.

## 2013-11-16 ENCOUNTER — Other Ambulatory Visit: Payer: Self-pay | Admitting: Internal Medicine

## 2013-11-17 ENCOUNTER — Encounter: Payer: Self-pay | Admitting: Internal Medicine

## 2013-11-17 NOTE — Telephone Encounter (Signed)
Happy to see her after her event monitor is completed to review results and check on her symptoms.

## 2013-11-18 NOTE — Telephone Encounter (Signed)
Pt scheduled to see Dr Burt Knack 11/19/13.

## 2013-11-19 ENCOUNTER — Ambulatory Visit (INDEPENDENT_AMBULATORY_CARE_PROVIDER_SITE_OTHER): Payer: Medicare Other | Admitting: Cardiovascular Disease

## 2013-11-19 ENCOUNTER — Encounter: Payer: Self-pay | Admitting: Cardiovascular Disease

## 2013-11-19 VITALS — BP 140/90 | HR 81 | Ht 62.0 in | Wt 226.0 lb

## 2013-11-19 DIAGNOSIS — R002 Palpitations: Secondary | ICD-10-CM

## 2013-11-19 NOTE — Progress Notes (Signed)
HPI:  68 year old woman presenting for followup evaluation. She has multiple medical problems. She is followed here because of coronary artery disease with history of CABG in 2004. She underwent cardiac catheterization last year demonstrating stable CAD and continued patency of her LIMA graft. Other problems include paroxysmal atrial fibrillation, history of stroke, morbid obesity, diabetes, hypertension, and left bundle branch block. Her LV function has been within normal limits.   The patient has been experiencing worsening palpitations over the last 6-8 weeks. She describes periods of time with isolated palpitations. She denies any tachycardia palpitations. She feels bad when these occur and describes associated fatigue. Palpitations are primarily occurring at nighttime when she is resting. She has no new exertional symptoms. She specifically denies chest pain or shortness of breath. She remains limited by severe bilateral knee pain. She's been going through a particularly difficult time with grieving the loss of her grandson who died 4 years ago. She has been having a more difficult time of late dealing with his death. She has not been sleeping as well. She also brings up some other past stressors in her life that have been difficult for her of late.  Outpatient Encounter Prescriptions as of 11/19/2013  Medication Sig  . ALPRAZolam (XANAX) 1 MG tablet Take 1 tablet (1 mg total) by mouth 3 (three) times daily as needed for anxiety.  Marland Kitchen atenolol (TENORMIN) 100 MG tablet Take 100 mg by mouth every morning.  Marland Kitchen atorvastatin (LIPITOR) 40 MG tablet TAKE 1 TABLET BY MOUTH DAILY  . clotrimazole-betamethasone (LOTRISONE) cream Apply 1 application topically daily as needed (for itching).  . cyclobenzaprine (FEXMID) 7.5 MG tablet Take 1 tablet (7.5 mg total) by mouth 3 (three) times daily as needed for muscle spasms.  . fluocinonide cream (LIDEX) 6.43 % Apply 1 application topically as needed (For itching).     . furosemide (LASIX) 20 MG tablet TAKE 1 TABLET BY MOUTH EVERY DAY  . gabapentin (NEURONTIN) 300 MG capsule Take 300 mg by mouth 3 (three) times daily.  Marland Kitchen HYDROcodone-acetaminophen (NORCO) 10-325 MG per tablet Take 1 tablet by mouth every 4 (four) hours as needed (pain).  . insulin detemir (LEVEMIR) 100 UNIT/ML injection Inject 0.3 mLs (30 Units total) into the skin 2 (two) times daily.  . Liraglutide (VICTOZA) 18 MG/3ML SOPN Inject 1.8 mg into the skin daily.  . metFORMIN (GLUCOPHAGE-XR) 500 MG 24 hr tablet TAKE 2 TABLETS BY MOUTH TWICE DAILY WITH MEALS  . nitroGLYCERIN (NITROSTAT) 0.4 MG SL tablet Place 1 tablet (0.4 mg total) under the tongue every 5 (five) minutes as needed. For chest pain  . ONE TOUCH ULTRA TEST test strip   . potassium chloride SA (K-DUR,KLOR-CON) 20 MEQ tablet Take 20 mEq by mouth 2 (two) times daily.  Marland Kitchen spironolactone (ALDACTONE) 25 MG tablet TAKE 1 TABLET BY MOUTH TWICE DAILY  . telmisartan (MICARDIS) 40 MG tablet TAKE 1 TABLET BY MOUTH EVERY DAY  . warfarin (COUMADIN) 6 MG tablet Take as directed by anticoagulation clinic  . [DISCONTINUED] atorvastatin (LIPITOR) 20 MG tablet Take 40 mg by mouth daily.  . [DISCONTINUED] fluticasone (FLONASE) 50 MCG/ACT nasal spray Place 2 sprays into both nostrils daily.  . [DISCONTINUED] hydrocortisone 2.5 % ointment   . [DISCONTINUED] Levomilnacipran HCl ER (FETZIMA) 40 MG CP24 Take 1 capsule by mouth daily.    Allergies  Allergen Reactions  . Metformin And Related Other (See Comments)    Must take XR form only, cannot tolerate Regular release metformin  . Cleocin [  Clindamycin Hcl] Diarrhea  . Codeine Itching  . Doxycycline Hyclate Diarrhea  . Macrolides And Ketolides Diarrhea  . Morphine Hives  . Pentazocine Lactate Itching and Nausea And Vomiting  . Vibramycin [Doxycycline Calcium] Diarrhea and Rash  . Sulfonamide Derivatives Rash    Past Medical History  Diagnosis Date  . CAD (coronary artery disease)   . PAF  (paroxysmal atrial fibrillation)   . Hypertension   . Hyperlipidemia   . Hypokalemia   . Diabetes mellitus   . Depression   . Lumbar back pain   . LBBB (left bundle branch block)   . Headache(784.0)   . Lymphadenitis   . Arthritis   . Bursitis   . Chronic anticoagulation     on coumadin  . Heart palpitations   . Diastolic dysfunction     per echo in October 2012 with EF 50 to 55%  . 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 GERD (gastroesophageal reflux disease) 10/23/2003  . Fibromyalgia   . Blood transfusion without reported diagnosis     ROS: Negative except as per HPI  BP 140/90  Pulse 81  Ht 5\' 2"  (1.575 m)  Wt 226 lb (102.513 kg)  BMI 41.33 kg/m2  PHYSICAL EXAM: Pt is alert and oriented, pleasant obese woman in NAD HEENT: normal Neck: JVP - normal, carotids 2+= without bruits Lungs: CTA bilaterally CV: RRR without murmur or gallop Abd: soft, NT, Positive BS, no hepatomegaly Ext: no C/C/E, distal pulses intact and equal Skin: warm/dry no rash  ASSESSMENT AND PLAN: 1. Coronary artery disease, native vessel. Last cardiac catheterization findings reviewed. She has no high-grade obstructive CAD and her mammary graft was patent. Continue with medical management.   2. Hypertension. Blood pressure is controlled on current program. She takes atenolol, furosemide, Aldactone, and telmisartan. Stressed the importance of exercise and weight loss.  3. Chronic diastolic heart failure. He continues to appear clinically euvolemic. No changes were made to her medical regimen. She is New [employer] Heart Association functional class II.  4. Hyperlipidemia. The patient is on atorvastatin.   5. PAF - maintaining sinus rhythm. On warfarin.  6. Palpitations. I reviewed the patient's entire event monitor with her today. She is having occasional PVCs. There is no atrial fibrillation or other sustained arrhythmia. We discussed treatment options. She is already on a beta blocker. Could  change her medication, but I am inclined to continue the same and she has done well on it for several years. I believe stress, grief, and depression are significantly contributing to the way she is feeling. I asked her to discuss all this with her primary care physician. She was reassured about her cardiac condition today.  Sherren Mocha  MD  11/19/2013 11:08 AM

## 2013-11-19 NOTE — Patient Instructions (Signed)
Your physician wants you to follow-up in: 6 MONTHS with Dr Cooper.  You will receive a reminder letter in the mail two months in advance. If you don't receive a letter, please call our office to schedule the follow-up appointment.  Your physician recommends that you continue on your current medications as directed. Please refer to the Current Medication list given to you today.  

## 2013-11-20 ENCOUNTER — Ambulatory Visit (INDEPENDENT_AMBULATORY_CARE_PROVIDER_SITE_OTHER): Payer: Medicare Other | Admitting: *Deleted

## 2013-11-20 DIAGNOSIS — Z5181 Encounter for therapeutic drug level monitoring: Secondary | ICD-10-CM

## 2013-11-20 DIAGNOSIS — I4891 Unspecified atrial fibrillation: Secondary | ICD-10-CM

## 2013-11-20 LAB — POCT INR: INR: 2.4

## 2013-11-25 ENCOUNTER — Ambulatory Visit
Admission: RE | Admit: 2013-11-25 | Discharge: 2013-11-25 | Disposition: A | Discharge status: Home / Self Care | Payer: Medicare Other | Source: Ambulatory Visit

## 2013-11-25 DIAGNOSIS — Z1231 Encounter for screening mammogram for malignant neoplasm of breast: Secondary | ICD-10-CM

## 2013-12-04 ENCOUNTER — Other Ambulatory Visit: Payer: Self-pay | Admitting: Internal Medicine

## 2013-12-04 ENCOUNTER — Encounter: Payer: Self-pay | Admitting: Internal Medicine

## 2013-12-04 ENCOUNTER — Telehealth: Payer: Self-pay | Admitting: *Deleted

## 2013-12-04 MED ORDER — INSULIN NPH (HUMAN) (ISOPHANE) 100 UNIT/ML ~~LOC~~ SUSP: 25.0000 [IU] | Freq: Two times a day (BID) | SUBCUTANEOUS | Status: DC

## 2013-12-04 NOTE — Telephone Encounter (Signed)
Sent NPH to pharmacy - messaged her about dose.

## 2013-12-04 NOTE — Telephone Encounter (Signed)
Pt sent this message via MyChart: dr Cruzita Lederer, do you have a vial of levemir that I may get? I am in the medigap.. I'm trying to afford my meds, though I don't have the finances at this time. I'm out of insulin at this time.   I messaged the pt back and advised her: Ms Kristen [name], We are no longer carrying samples in the office, as of June 1st this year. I am so sorry that you are out of insulin. I will let Dr Cruzita Lederer know and see if there are any less expensive options for you.   Please advise.

## 2013-12-06 ENCOUNTER — Encounter: Payer: Self-pay | Admitting: Internal Medicine

## 2013-12-06 ENCOUNTER — Other Ambulatory Visit: Payer: Self-pay | Admitting: Internal Medicine

## 2013-12-06 ENCOUNTER — Ambulatory Visit (INDEPENDENT_AMBULATORY_CARE_PROVIDER_SITE_OTHER): Payer: Medicare Other | Admitting: Internal Medicine

## 2013-12-06 VITALS — BP 126/84 | HR 96 | Temp 97.9°F | Resp 14 | Ht 61.0 in | Wt 231.4 lb

## 2013-12-06 DIAGNOSIS — M159 Polyosteoarthritis, unspecified: Secondary | ICD-10-CM

## 2013-12-06 DIAGNOSIS — I482 Chronic atrial fibrillation, unspecified: Secondary | ICD-10-CM

## 2013-12-06 DIAGNOSIS — M150 Primary generalized (osteo)arthritis: Secondary | ICD-10-CM

## 2013-12-06 DIAGNOSIS — E1159 Type 2 diabetes mellitus with other circulatory complications: Secondary | ICD-10-CM

## 2013-12-06 DIAGNOSIS — E1151 Type 2 diabetes mellitus with diabetic peripheral angiopathy without gangrene: Secondary | ICD-10-CM

## 2013-12-06 DIAGNOSIS — M797 Fibromyalgia: Secondary | ICD-10-CM

## 2013-12-06 DIAGNOSIS — Z6841 Body Mass Index (BMI) 40.0 and over, adult: Secondary | ICD-10-CM

## 2013-12-06 DIAGNOSIS — F418 Other specified anxiety disorders: Secondary | ICD-10-CM

## 2013-12-06 DIAGNOSIS — I10 Essential (primary) hypertension: Secondary | ICD-10-CM

## 2013-12-06 DIAGNOSIS — E039 Hypothyroidism, unspecified: Secondary | ICD-10-CM

## 2013-12-06 DIAGNOSIS — I25810 Atherosclerosis of coronary artery bypass graft(s) without angina pectoris: Secondary | ICD-10-CM

## 2013-12-06 MED ORDER — HYDROCODONE-ACETAMINOPHEN 10-325 MG PO TABS: 1.0000 | ORAL_TABLET | ORAL | Status: DC | PRN

## 2013-12-06 MED ORDER — PAROXETINE HCL 20 MG PO TABS: 20.0000 mg | ORAL_TABLET | Freq: Every day | ORAL | Status: DC

## 2013-12-06 MED ORDER — GABAPENTIN 300 MG PO CAPS: 300.0000 mg | ORAL_CAPSULE | Freq: Three times a day (TID) | ORAL | Status: DC

## 2013-12-06 NOTE — Patient Instructions (Addendum)
We will start a medicine called paroxetine for your anxiety and pain. We would like you to start taking 1 pill per day. Try to decrease the xanax to 1 pill twice a day. In 2 weeks call us back and let us know how it is going and we can increase the dose if needed. We will see you back in about 6-8 weeks to check on your mood.   Once we get the mood stabilized we will have you change the xanax down to once per day. After several weeks of once a day we would like you to only use it if you need it for anxiety or panic attack. This is not a medicine that should be used everyday.   We will work on your pain next visit but may need to change that medicine in the future.  With your fibromyalgia you need to work on increasing your exercise to help desensitize your body to the activity. Water aerobics can be a good option for your joints.

## 2013-12-06 NOTE — Assessment & Plan Note (Signed)
Recent thyroid function panel normal with endocrinologist. Not on medication for thyroid at this time.

## 2013-12-06 NOTE — Assessment & Plan Note (Addendum)
Currently taking 5 hydrocodones per day for the pain. If she continues on this medicine then will need to switch to long acting agent. Hopefully she can have knee replacements and this will ease the pain. Her weight is likely a large contributor to the pain and advised her to start with exercise (water aerobics).

## 2013-12-06 NOTE — Assessment & Plan Note (Signed)
BP well controlled at today's visit. Recent BMP reviewed and creatinine and electrolytes normal.

## 2013-12-06 NOTE — Assessment & Plan Note (Addendum)
S/P CABG and with mildly reduced EF. She is on ARB, ASA, lasix, spironolactone, statin. No angina at this time. Exercise tolerance limited by her osteoarthritis.

## 2013-12-06 NOTE — Assessment & Plan Note (Signed)
On coumadin, will review records but seems she has history of stroke. She does take atenolol and HR at goal today.

## 2013-12-06 NOTE — Telephone Encounter (Signed)
Will deny and fill at apt

## 2013-12-06 NOTE — Progress Notes (Signed)
Pre visit review using our clinic review tool, if applicable. No additional management support is needed unless otherwise documented below in the visit note. 

## 2013-12-06 NOTE — Telephone Encounter (Signed)
Pt has an appt today with at 2:30 to establish care

## 2013-12-06 NOTE — Assessment & Plan Note (Signed)
Ideally would need recommendation to bariatric clinic if she is serious about weight loss. If she has knee replacement she will have continued problems given her weight and the stress on her joints.

## 2013-12-06 NOTE — Assessment & Plan Note (Signed)
Sees endocrinology for her control and last HgA1c less than 3 months ago 7.1. She takes levemir 30 units BID, NPH 25 units BID, victoza, metformin. She is on ARB, foot exam performed. Reminded about eye exam. On statin.

## 2013-12-06 NOTE — Assessment & Plan Note (Signed)
Patient has been taking xanax for some time. Do not feel comfortable keeping her on this medication with other sedating agents especially at such a high dose and frequency. Have started paroxetine and she will call back in 2-3 weeks (can increase dose at that time) and back in 6-8 weeks. In the meanwhile have asked her to wean off the xanax. To cut to BID in 1 week then taper down to daily in 3 weeks after that and then only as needed for panic attacks. Will not refill the xanax for her and told her that today.

## 2013-12-06 NOTE — Progress Notes (Addendum)
   Subjective:    Patient ID: Kristen [name], female    DOB: 06-05-1945, 68 y.o.   MRN: [id number]  HPI The patient is a 68 YO female who is coming in today to establish care. She has PMH of DM type 2, morbid obesity, CAD, depression with anxiety, A fib (on coumadin), GERD, HTN, knee pain, hypothyroidism. She is dealing a lot with her pain at this time and has to take 5 hydrocodones per day most days. Sometimes less but sometimes more. She also takes her xanax 3 times per day and has been on it for some time. She also takes gabapentin 3 times per day and is unsure if it helps. She has been tried on some meds for fibromyalgia and tried cymbalta (cannot remember why stopped). She has a lot of shoulder pain(left) and likely needs another surgery (plus both knee replacements). She thinks her surgeon will want her to see her doctor and cardiologist prior to surgery. No chest pains, SOB. No abdominal pain, constipation, diarrhea.   Review of Systems  Constitutional: Negative for activity change, appetite change and unexpected weight change.  HENT: Negative.   Respiratory: Negative for cough, chest tightness and shortness of breath.   Cardiovascular: Negative for chest pain, palpitations and leg swelling.  Gastrointestinal: Negative for abdominal pain, diarrhea, constipation and abdominal distention.  Musculoskeletal: Positive for arthralgias and back pain. Negative for gait problem and myalgias.  Skin: Negative.   Hematological: Bruises/bleeds easily.  Psychiatric/Behavioral: Positive for sleep disturbance. Negative for suicidal ideas and self-injury. The patient is nervous/anxious.       Objective:   Physical Exam  Constitutional: She is oriented to person, place, and time. She appears well-developed and well-nourished.  HENT:  Head: Atraumatic.  Eyes: EOM are normal. Pupils are equal, round, and reactive to light.  Neck: Normal range of motion.  Cardiovascular: Normal rate and regular  rhythm.   Pulmonary/Chest: Effort normal and breath sounds normal.  Abdominal: Soft. Bowel sounds are normal.  Neurological: She is alert and oriented to person, place, and time. Coordination normal.   Filed Vitals:   12/06/13 1440  BP: 126/84  Pulse: 96  Temp: 97.9 F (36.6 C)  TempSrc: Oral  Resp: 14  Height: 5\' 1"  (1.549 m)  Weight: 231 lb 6.4 oz (104.962 kg)  SpO2: 97%      Assessment & Plan:

## 2013-12-06 NOTE — Assessment & Plan Note (Signed)
Patient not willing to retry cymbalta at this time (could not remember previous side effect) and will start paroxetine. Advised her to start with some exercise and recommended water aerobics if her osteoarthritis prevents walking etc.

## 2013-12-09 ENCOUNTER — Other Ambulatory Visit: Payer: Self-pay | Admitting: Internal Medicine

## 2013-12-09 DIAGNOSIS — E1151 Type 2 diabetes mellitus with diabetic peripheral angiopathy without gangrene: Secondary | ICD-10-CM

## 2013-12-09 MED ORDER — INSULIN NPH (HUMAN) (ISOPHANE) 100 UNIT/ML ~~LOC~~ SUSP: 25.0000 [IU] | Freq: Two times a day (BID) | SUBCUTANEOUS | Status: DC

## 2013-12-16 ENCOUNTER — Telehealth: Payer: Self-pay | Admitting: Cardiology

## 2013-12-16 NOTE — Telephone Encounter (Signed)
Calling regarding occasional palpitations.  Feels differently than her afib. A beat followed by pause a brief pause. No syncope, pre-syncope, chest pain. BP of 130s/60s. No urgent needs. Recommended follow-up with office RN call in the AM. FYI to Dr. Burt Knack.

## 2013-12-17 ENCOUNTER — Telehealth: Payer: Self-pay | Admitting: Cardiovascular Disease

## 2013-12-17 ENCOUNTER — Ambulatory Visit: Payer: Medicare Other | Admitting: Internal Medicine

## 2013-12-17 NOTE — Telephone Encounter (Signed)
New message      Patient has had irr heartbeat twice today and has felt lightheaded.  She want to talk to Ander Purpura

## 2013-12-17 NOTE — Telephone Encounter (Signed)
I spoke with the pt and she complains of having 2-3 episodes of palpitations, diaphoresis and pain in her chest almost on a daily basis.  The pt has been reviewing her medications and she feels like her Victoza could be causing some of her symptoms.  The pt states that when she has these spells she starts to have a panic attack. At this time the pt does not feel like her symptoms are from a cardiac source.  The pt plans to contact Endocrinology and PCP to discuss her medications.  The pt was recently started on Paxil by her PCP and she feels like this is further contributing to her symptoms.  The pt was advised to contact our office with any other questions or concerns. Pt agreed with plan.

## 2013-12-17 NOTE — Telephone Encounter (Signed)
error 

## 2013-12-18 ENCOUNTER — Telehealth: Payer: Self-pay

## 2013-12-18 NOTE — Telephone Encounter (Signed)
Pt states that she has not had an eye exam in 2 years.  Plans to schedule an appointment soon.

## 2013-12-19 ENCOUNTER — Encounter: Payer: Self-pay | Admitting: Internal Medicine

## 2013-12-19 ENCOUNTER — Telehealth: Payer: Self-pay | Admitting: Internal Medicine

## 2013-12-19 ENCOUNTER — Ambulatory Visit (INDEPENDENT_AMBULATORY_CARE_PROVIDER_SITE_OTHER): Payer: Medicare Other | Admitting: Internal Medicine

## 2013-12-19 VITALS — BP 122/82 | HR 79 | Temp 98.1°F | Wt 228.1 lb

## 2013-12-19 DIAGNOSIS — M797 Fibromyalgia: Secondary | ICD-10-CM

## 2013-12-19 DIAGNOSIS — I10 Essential (primary) hypertension: Secondary | ICD-10-CM

## 2013-12-19 DIAGNOSIS — F418 Other specified anxiety disorders: Secondary | ICD-10-CM

## 2013-12-19 MED ORDER — ESCITALOPRAM OXALATE 10 MG PO TABS: 10.0000 mg | ORAL_TABLET | Freq: Every day | ORAL | Status: DC

## 2013-12-19 NOTE — Progress Notes (Signed)
Subjective:    Patient ID: Kristen [name], female    DOB: [birth year], 68 y.o.   MRN: [id number]  HPI  Here to f/u with acute, today states since paxil 20 mg onset has had increased anxieyt, palpitations, dizzy/lightheaded, general weakness, lower appetitie, ? Low blood sugars , and nausea.  CBG was 49 at one point yesterday, worried and stayed up all night last night to monitor blood sugars,, very tired.  Had a similar reaction to paxil in years past as tried per Dr Arnoldo Morale, her former PCP.  Did call card, has hx of 30 day monitor, not felt to likely have signifcant arrythmia per card per pt.  Has not taken the paxil today.   Has also tried cymbalta, had some sort of intolerance with that as well.  Had a grandchild die at 11yo with neurofibromatosis in 2011 that she raised, still grieving and co ongoing persistent depressive symtpoms, likely situaitional.  Completed one yr of grief counseling.  Has also tried fetzima per Dr Arnoldo Morale in past, but stopped after palpitaions seen on side effect list. Has been on xanax since 1991 per pt.  Now taking less xanax at bid per Dr Doug Sou, pt states not doing as well, still recalls admonitions per Dr Alex Gardener regarding risk of worsening HTN with stress.  Worried her BP will shoot up with less xanax than before. Is willing to try the lexapro for to reduce at least the panic attacks.  No SI or HI Past Medical History  Diagnosis Date  . CAD (coronary artery disease)   . PAF (paroxysmal atrial fibrillation)   . Hypertension   . Hyperlipidemia   . Hypokalemia   . Diabetes mellitus   . Depression   . Lumbar back pain   . LBBB (left bundle branch block)   . Headache(784.0)   . Lymphadenitis   . Arthritis   . Bursitis   . Chronic anticoagulation     on coumadin  . Heart palpitations   . Diastolic dysfunction     per echo in October 2012 with EF 50 to 55%  . 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 GERD (gastroesophageal reflux disease) 10/23/2003  .  Fibromyalgia   . Blood transfusion without reported diagnosis    Past Surgical History  Procedure Laterality Date  . Angioplasty  laminectomy  . Lumbar laminectomy      x3  . Coronary artery bypass graft      LIMA to LAD   . Abdominal hysterectomy    . Cholecystectomy    . Tonsillectomy    . Knee arthroscopy    . Sphincterotomy      reports that she quit smoking about 12 years ago. She has never used smokeless tobacco. She reports that she does not drink alcohol or use illicit drugs. family history includes Arthritis in an other family member; Colon cancer in her paternal uncle; Diabetes in an other family member; Heart attack in her father; Heart disease in her father; Hypertension in an other family member; Kidney disease in an other family member; Stroke in her mother and another family member. Allergies  Allergen Reactions  . Metformin And Related Other (See Comments)    Must take XR form only, cannot tolerate Regular release metformin  . Cleocin [Clindamycin Hcl] Diarrhea  . Codeine Itching  . Doxycycline Hyclate Diarrhea  . Macrolides And Ketolides Diarrhea  . Morphine Hives  . Pentazocine Lactate Itching and Nausea And Vomiting  . Vibramycin [Doxycycline  Calcium] Diarrhea and Rash  . Sulfonamide Derivatives Rash   Current Outpatient Prescriptions on File Prior to Visit  Medication Sig Dispense Refill  . ALPRAZolam (XANAX) 1 MG tablet Take 1 tablet (1 mg total) by mouth 3 (three) times daily as needed for anxiety.  90 tablet  5  . atenolol (TENORMIN) 100 MG tablet Take 100 mg by mouth every morning.      Marland Kitchen atorvastatin (LIPITOR) 40 MG tablet TAKE 1 TABLET BY MOUTH DAILY  30 tablet  2  . clotrimazole-betamethasone (LOTRISONE) cream Apply 1 application topically daily as needed (for itching).      . cyclobenzaprine (FEXMID) 7.5 MG tablet Take 1 tablet (7.5 mg total) by mouth 3 (three) times daily as needed for muscle spasms.  15 tablet  0  . fluocinonide cream (LIDEX) 3.23 %  Apply 1 application topically as needed (For itching).       . furosemide (LASIX) 20 MG tablet TAKE 1 TABLET BY MOUTH EVERY DAY  30 tablet  9  . gabapentin (NEURONTIN) 300 MG capsule Take 1 capsule (300 mg total) by mouth 3 (three) times daily.  90 capsule  0  . HYDROcodone-acetaminophen (NORCO) 10-325 MG per tablet Take 1 tablet by mouth every 4 (four) hours as needed (pain). DO NOT FILL UNTIL 12/18/13  150 tablet  0  . insulin detemir (LEVEMIR) 100 UNIT/ML injection Inject 0.3 mLs (30 Units total) into the skin 2 (two) times daily.  20 mL  2  . insulin NPH Human (HUMULIN N) 100 UNIT/ML injection Inject 0.25 mLs (25 Units total) into the skin 2 (two) times daily at 8 am and 10 pm.  10 mL  11  . Liraglutide (VICTOZA) 18 MG/3ML SOPN Inject 1.8 mg into the skin daily.      . metFORMIN (GLUCOPHAGE-XR) 500 MG 24 hr tablet TAKE 2 TABLETS BY MOUTH TWICE DAILY WITH MEALS  120 tablet  11  . nitroGLYCERIN (NITROSTAT) 0.4 MG SL tablet Place 1 tablet (0.4 mg total) under the tongue every 5 (five) minutes as needed. For chest pain  30 tablet  6  . ONE TOUCH ULTRA TEST test strip       . PARoxetine (PAXIL) 20 MG tablet Take 1 tablet (20 mg total) by mouth daily.  30 tablet  3  . potassium chloride SA (K-DUR,KLOR-CON) 20 MEQ tablet Take 20 mEq by mouth 2 (two) times daily.      Marland Kitchen spironolactone (ALDACTONE) 25 MG tablet TAKE 1 TABLET BY MOUTH TWICE DAILY  60 tablet  5  . telmisartan (MICARDIS) 40 MG tablet TAKE 1 TABLET BY MOUTH EVERY DAY  30 tablet  5  . warfarin (COUMADIN) 6 MG tablet Take as directed by anticoagulation clinic  30 tablet  3   No current facility-administered medications on file prior to visit.   Review of Systems    All otherwise neg per pt  Objective:   Physical Exam BP 122/82  Pulse 79  Temp(Src) 98.1 F (36.7 C) (Oral)  Wt 228 lb 2 oz (103.477 kg)  SpO2 96% VS noted,  Constitutional: Pt appears well-developed, well-nourished.  HENT: Head: NCAT.  Right Ear: External ear normal.    Left Ear: External ear normal.  Eyes: . Pupils are equal, round, and reactive to light. Conjunctivae and EOM are normal Neck: Normal range of motion. Neck supple.  Cardiovascular: Normal rate and regular rhythm.   Pulmonary/Chest: Effort normal and breath sounds normal.  Abd:  Soft, NT, ND, + BS Neurological:  Pt is alert. Not confused , motor grossly intact Skin: Skin is warm. No rash Psychiatric: Pt behavior is normal. No agitation. mod dysphoric, tearful talking about her grandchild, mild nervous    Assessment & Plan:

## 2013-12-19 NOTE — Telephone Encounter (Signed)
Patient Information:  Caller Name: [name]  Phone: [phone]  Patient: Kristen [name]  Gender: Female  DOB: [birth year]  Age: 68 Years  PCP: Vertell Novak A.  Office Follow Up:  Does the office need to follow up with this patient?: No  Instructions For The Office: N/A   Symptoms  Reason For Call & Symptoms: Reports anxiety and depression has gotten worse since starting Paxil approximately 8 days ago. Reports she has had some palpitations at times but not having any now. Patient is not able to complete sentences at times.  Reviewed Health History In EMR: Yes  Reviewed Medications In EMR: Yes  Reviewed Allergies In EMR: Yes  Reviewed Surgeries / Procedures: Yes  Date of Onset of Symptoms: 12/13/2013  Treatments Tried: Xanax  Treatments Tried Worked: Yes  Guideline(s) Used:  No Protocol Available - Sick Adult  Disposition Per Guideline:   See Today in Office  Reason For Disposition Reached:   Nursing judgment  Advice Given:  N/A  Patient Will Follow Care Advice:  YES  Appointment Scheduled:  12/19/2013 15:30:00 Appointment Scheduled Provider:  Cathlean Cower (Adults only)

## 2013-12-19 NOTE — Telephone Encounter (Signed)
Patient has appointment with Dr. Jenny Reichmann today at 3:30 pm.

## 2013-12-19 NOTE — Patient Instructions (Signed)
OK to stop the paxil  After 1 week, please start the Lexapro 10 mg per day, which should at least control the panic attacks  Please continue all other medications as before  Please have the pharmacy call with any other refills you may need.  Please keep your appointments with your specialists as you may have planned  Please see Dr Doug Sou as you have planned in followup.

## 2013-12-19 NOTE — Assessment & Plan Note (Signed)
stable overall by history and exam, recent data reviewed with pt, and pt to continue medical treatment as before,  to f/u any worsening symptoms or concerns BP Readings from Last 3 Encounters:  12/19/13 122/82  12/06/13 126/84  11/19/13 140/90

## 2013-12-19 NOTE — Assessment & Plan Note (Signed)
Agree that paxil likely cause of current symptoms, and not just b/c she is "sensitive".  OK to cont the xanax as per Dr Doug Sou, but also after 1 wk washout, then start lexapro 10 qd, declines further cousneling, verified no SI or HI

## 2013-12-19 NOTE — Progress Notes (Signed)
Pre visit review using our clinic review tool, if applicable. No additional management support is needed unless otherwise documented below in the visit note. 

## 2013-12-19 NOTE — Assessment & Plan Note (Signed)
Unable to tolerated cymbalta or lyrica in past, to cont symptomatic treatment

## 2013-12-20 ENCOUNTER — Other Ambulatory Visit: Payer: Self-pay

## 2013-12-20 NOTE — Telephone Encounter (Signed)
Nothing was typed/tmj 

## 2013-12-25 ENCOUNTER — Encounter: Payer: Self-pay | Admitting: Internal Medicine

## 2013-12-27 ENCOUNTER — Ambulatory Visit: Payer: Medicare Other | Admitting: Internal Medicine

## 2013-12-30 ENCOUNTER — Telehealth: Payer: Self-pay | Admitting: Internal Medicine

## 2013-12-30 NOTE — Telephone Encounter (Signed)
Please read note below and advise.  

## 2013-12-30 NOTE — Telephone Encounter (Signed)
Patient states the metformin was making her really ill; she had to stop the metformin and she is feeling much better  She would like to know if Dr. Cruzita Lederer wants her to continue    Please advise patient   Thank you

## 2013-12-30 NOTE — Telephone Encounter (Signed)
That is too bad as it was really helping. She can back off to 500 mg bid or stop. In this case, we will probably need to increase NPH to 30 bid.

## 2013-12-31 ENCOUNTER — Encounter: Payer: Self-pay | Admitting: Internal Medicine

## 2013-12-31 ENCOUNTER — Other Ambulatory Visit: Payer: Self-pay | Admitting: *Deleted

## 2013-12-31 MED ORDER — GLIPIZIDE 5 MG PO TABS: ORAL_TABLET | ORAL | Status: DC

## 2013-12-31 NOTE — Telephone Encounter (Signed)
Called pt and advised her per Dr Arman Filter note. Pt said she quit the metformin and increased her NPH to 30 units. Pt stated she had to decrease back down to 25 due to AM lows > 68, 59. Pt said that she has had some after dinner (2 hours) highs 201-206. Pt feels she needs mealtime/dinner time insulin dose instead of late night dose. Please advise.

## 2013-12-31 NOTE — Telephone Encounter (Signed)
Let's call in Glipizide 5 mg before dinner. #30 refills:1. Please ask her to let us know how she is doing in few days.

## 2013-12-31 NOTE — Telephone Encounter (Signed)
Pt called back stating that she just doesn't have the extra $50 for the co-pay. Please advise what she should do. Thank you!

## 2013-12-31 NOTE — Telephone Encounter (Signed)
Called pt and advised her per Dr Arman Filter note to start the glipizide and call us in a few days to let us know how she is doing. Pt understood.

## 2013-12-31 NOTE — Telephone Encounter (Signed)
Called pt and advised her per Dr Arman Filter note. Pt is going to see if she is able to get transportation first.

## 2013-12-31 NOTE — Telephone Encounter (Signed)
One option is to add Glipizide 5 mg before dinner, but I would be more comfortable seeing her for a visit >> can she come tomorrow at 11:45 (I may get to see her at 12, though, since I am already full)? As there were multiple changes in her regimen since last visit 3 mo ago.

## 2013-12-31 NOTE — Telephone Encounter (Signed)
Done

## 2014-01-01 ENCOUNTER — Ambulatory Visit (INDEPENDENT_AMBULATORY_CARE_PROVIDER_SITE_OTHER): Payer: Medicare Other | Admitting: *Deleted

## 2014-01-01 DIAGNOSIS — I4891 Unspecified atrial fibrillation: Secondary | ICD-10-CM

## 2014-01-01 DIAGNOSIS — Z5181 Encounter for therapeutic drug level monitoring: Secondary | ICD-10-CM

## 2014-01-01 LAB — POCT INR: INR: 2.4

## 2014-01-06 ENCOUNTER — Other Ambulatory Visit: Payer: Self-pay | Admitting: Internal Medicine

## 2014-01-13 ENCOUNTER — Encounter: Payer: Self-pay | Admitting: Internal Medicine

## 2014-01-14 ENCOUNTER — Other Ambulatory Visit: Payer: Self-pay | Admitting: Geriatric Medicine

## 2014-01-14 MED ORDER — HYDROCODONE-ACETAMINOPHEN 10-325 MG PO TABS: 1.0000 | ORAL_TABLET | ORAL | Status: DC | PRN

## 2014-01-14 NOTE — Telephone Encounter (Signed)
Done and signed please call patient

## 2014-02-07 ENCOUNTER — Encounter: Payer: Self-pay | Admitting: Internal Medicine

## 2014-02-07 ENCOUNTER — Other Ambulatory Visit: Payer: Medicare Other

## 2014-02-07 ENCOUNTER — Ambulatory Visit (INDEPENDENT_AMBULATORY_CARE_PROVIDER_SITE_OTHER): Payer: Medicare Other | Admitting: Internal Medicine

## 2014-02-07 VITALS — BP 136/80 | HR 101 | Temp 97.9°F | Resp 20 | Ht 61.0 in | Wt 238.8 lb

## 2014-02-07 DIAGNOSIS — F418 Other specified anxiety disorders: Secondary | ICD-10-CM

## 2014-02-07 DIAGNOSIS — E669 Obesity, unspecified: Secondary | ICD-10-CM

## 2014-02-07 DIAGNOSIS — E1169 Type 2 diabetes mellitus with other specified complication: Secondary | ICD-10-CM

## 2014-02-07 DIAGNOSIS — E119 Type 2 diabetes mellitus without complications: Secondary | ICD-10-CM

## 2014-02-07 MED ORDER — ATORVASTATIN CALCIUM 40 MG PO TABS: 40.0000 mg | ORAL_TABLET | Freq: Every day | ORAL | Status: DC

## 2014-02-07 NOTE — Assessment & Plan Note (Signed)
Patient was unable to tolerate Paxil, has also been unable to tolerate the Lexapro she was recently prescribed. She has gone back up on her own to Xanax 1 mg 3 times a day. Did not feel comfortable continuing this dosage and have recommended that she visit a psychiatrist for their input on what appears to be a complicated depression and anxiety situation. She has failed multiple other medications and do not feel that she is safe to be on the Xanax 3 times a day.

## 2014-02-07 NOTE — Progress Notes (Signed)
   Subjective:    Patient ID: Kristen [name], female    DOB: 11/14/1945, 68 y.o.   MRN: [id number]  HPI Patient 10 minutes late to visit and was late to previous visit. The patient is coming in for follow-up of her depression and anxiety. Since our last visit she was able to decrease her Xanax to 1 mg twice a day while she was taking the Paxil however she had side effects from this medication was unable to tolerate it. She was seen emergently in our clinic and switched from Paxil to Lexapro. She did start trying this but has also been unable to tolerate it and is not feeling well today due to she feels the Lexapro. She wishes to discuss numerous things about why the Xanax is not a problem and she is unclear why she cannot keep taking it 3 times a day and states that her pharmacist told her that she would not have any problems with that as long as she had not had any problems with it previously. She tries to explain to me why anytime that she has fallen in the past it has not been her fault and there have been extenuating circumstances and how she does not feel the Xanax is related. She states during the visit that she cannot be coming to the office all the time and she does not have money for co-pays and she is on very limited income.  Review of Systems  Constitutional: Negative for activity change, appetite change and unexpected weight change.  HENT: Negative.   Respiratory: Negative for cough, chest tightness and shortness of breath.   Cardiovascular: Negative for chest pain, palpitations and leg swelling.  Gastrointestinal: Negative for abdominal pain, diarrhea, constipation and abdominal distention.  Musculoskeletal: Positive for back pain and arthralgias. Negative for myalgias and gait problem.  Skin: Negative.   Hematological: Does not bruise/bleed easily.  Psychiatric/Behavioral: Positive for sleep disturbance. Negative for suicidal ideas and self-injury. The patient is nervous/anxious.        Objective:   Physical Exam  Constitutional: She is oriented to person, place, and time. She appears well-developed and well-nourished.  HENT:  Head: Atraumatic.  Eyes: EOM are normal. Pupils are equal, round, and reactive to light.  Neck: Normal range of motion.  Cardiovascular: Normal rate and regular rhythm.   Pulmonary/Chest: Effort normal and breath sounds normal.  Abdominal: Soft. Bowel sounds are normal.  Neurological: She is alert and oriented to person, place, and time. Coordination normal.   Filed Vitals:   02/07/14 1356  BP: 136/80  Pulse: 101  Temp: 97.9 F (36.6 C)  TempSrc: Oral  Resp: 20  Height: 5\' 1"  (1.549 m)  Weight: 238 lb 12.8 oz (108.319 kg)  SpO2: 97%      Assessment & Plan:  Addendum to note: Patient returned from the lab and was demanding a refill of her narcotic pain medication although it is not due for 2 weeks. She stated that the last time this M.D. provided a prescription with do not fill before and she feels it is a radiculous to waste of her time to come back to this office in 2 weeks to pick up her prescription. She was notified that she did sign a pain contract stating that she would not request early refills.

## 2014-02-07 NOTE — Patient Instructions (Signed)
We would like you to go see a psychiatrist as you have had multiple difficulties with medications and they may be better equipped to help you to find a way to deal with your anxiety.  We do think that you need to find a way to exercise even if this means doing chair exercises at home or if you're able to go to a Y with a pool even walking in the water is beneficial.  We will check your urine for any protein in the urine today.  We will see back in about 3-4 months to check on your diabetes. We will leave her medications the same and we'll talk again about next visit modifying her regimen as I still believe that the Xanax is not a safe medication for you to be taking so much of. Before our next visit if you are able to try taking half pills at times when you're having better days that would be good..  Stress and Stress Management Stress is a normal reaction to life events. It is what you feel when life demands more than you are used to or more than you can handle. Some stress can be useful. For example, the stress reaction can help you catch the last bus of the day, study for a test, or meet a deadline at work. But stress that occurs too often or for too long can cause problems. It can affect your emotional health and interfere with relationships and normal daily activities. Too much stress can weaken your immune system and increase your risk for physical illness. If you already have a medical problem, stress can make it worse. CAUSES  All sorts of life events may cause stress. An event that causes stress for one person may not be stressful for another person. Major life events commonly cause stress. These may be positive or negative. Examples include losing your job, moving into a new home, getting married, having a baby, or losing a loved one. Less obvious life events may also cause stress, especially if they occur day after day or in combination. Examples include working long hours, driving in traffic,  caring for children, being in debt, or being in a difficult relationship. SIGNS AND SYMPTOMS Stress may cause emotional symptoms including, the following:  Anxiety. This is feeling worried, afraid, on edge, overwhelmed, or out of control.  Anger. This is feeling irritated or impatient.  Depression. This is feeling sad, down, helpless, or guilty.  Difficulty focusing, remembering, or making decisions. Stress may cause physical symptoms, including the following:   Aches and pains. These may affect your head, neck, back, stomach, or other areas of your body.  Tight muscles or clenched jaw.  Low energy or trouble sleeping. Stress may cause unhealthy behaviors, including the following:   Eating to feel better (overeating) or skipping meals.  Sleeping too little, too much, or both.  Working too much or putting off tasks (procrastination).  Smoking, drinking alcohol, or using drugs to feel better. DIAGNOSIS  Stress is diagnosed through an assessment by your health care provider. Your health care provider will ask questions about your symptoms and any stressful life events.Your health care provider will also ask about your medical history and may order blood tests or other tests. Certain medical conditions and medicine can cause physical symptoms similar to stress. Mental illness can cause emotional symptoms and unhealthy behaviors similar to stress. Your health care provider may refer you to a mental health professional for further evaluation.  TREATMENT  Stress  management is the recommended treatment for stress.The goals of stress management are reducing stressful life events and coping with stress in healthy ways.  Techniques for reducing stressful life events include the following:  Stress identification. Self-monitor for stress and identify what causes stress for you. These skills may help you to avoid some stressful events.  Time management. Set your priorities, keep a calendar  of events, and learn to say "no." These tools can help you avoid making too many commitments. Techniques for coping with stress include the following:  Rethinking the problem. Try to think realistically about stressful events rather than ignoring them or overreacting. Try to find the positives in a stressful situation rather than focusing on the negatives.  Exercise. Physical exercise can release both physical and emotional tension. The key is to find a form of exercise you enjoy and do it regularly.  Relaxation techniques. These relax the body and mind. Examples include yoga, meditation, tai chi, biofeedback, deep breathing, progressive muscle relaxation, listening to music, being out in nature, journaling, and other hobbies. Again, the key is to find one or more that you enjoy and can do regularly.  Healthy lifestyle. Eat a balanced diet, get plenty of sleep, and do not smoke. Avoid using alcohol or drugs to relax.  Strong support network. Spend time with family, friends, or other people you enjoy being around.Express your feelings and talk things over with someone you trust. Counseling or talktherapy with a mental health professional may be helpful if you are having difficulty managing stress on your own. Medicine is typically not recommended for the treatment of stress.Talk to your health care provider if you think you need medicine for symptoms of stress. HOME CARE INSTRUCTIONS  Keep all follow-up visits as directed by your health care provider.  Take all medicines as directed by your health care provider. SEEK MEDICAL CARE IF:  Your symptoms get worse or you start having new symptoms.  You feel overwhelmed by your problems and can no longer manage them on your own. SEEK IMMEDIATE MEDICAL CARE IF:  You feel like hurting yourself or someone else. Document Released: 08/17/2000 Document Revised: 07/08/2013 Document Reviewed: 10/16/2012 [facility] Patient Information 2015 [place],  [place]. This information is not intended to replace advice given to you by your health care provider. Make sure you discuss any questions you have with your health care provider.

## 2014-02-09 ENCOUNTER — Telehealth: Payer: Self-pay | Admitting: Internal Medicine

## 2014-02-09 LAB — MICROALBUMIN / CREATININE URINE RATIO
Creatinine,U: 138.7 mg/dL
Microalb Creat Ratio: 0.5 mg/g (ref 0.0–30.0)
Microalb, Ur: 0.7 mg/dL (ref 0.0–1.9)

## 2014-02-11 ENCOUNTER — Other Ambulatory Visit: Payer: Self-pay | Admitting: Geriatric Medicine

## 2014-02-11 ENCOUNTER — Encounter: Payer: Self-pay | Admitting: Internal Medicine

## 2014-02-11 MED ORDER — GABAPENTIN 300 MG PO CAPS: 300.0000 mg | ORAL_CAPSULE | Freq: Three times a day (TID) | ORAL | Status: DC

## 2014-02-11 NOTE — Telephone Encounter (Signed)
Patient is requesting gabapentin to be sent to [facility] at [facility]

## 2014-02-12 ENCOUNTER — Telehealth: Payer: Self-pay | Admitting: Family

## 2014-02-12 ENCOUNTER — Ambulatory Visit (INDEPENDENT_AMBULATORY_CARE_PROVIDER_SITE_OTHER): Payer: Medicare Other

## 2014-02-12 DIAGNOSIS — Z5181 Encounter for therapeutic drug level monitoring: Secondary | ICD-10-CM

## 2014-02-12 DIAGNOSIS — I4891 Unspecified atrial fibrillation: Secondary | ICD-10-CM

## 2014-02-12 LAB — POCT INR: INR: 3.1

## 2014-02-12 NOTE — Telephone Encounter (Signed)
Agree with plan 

## 2014-02-13 ENCOUNTER — Other Ambulatory Visit: Payer: Self-pay | Admitting: Geriatric Medicine

## 2014-02-13 ENCOUNTER — Telehealth: Payer: Self-pay | Admitting: Internal Medicine

## 2014-02-13 ENCOUNTER — Other Ambulatory Visit: Payer: Self-pay | Admitting: Internal Medicine

## 2014-02-13 ENCOUNTER — Encounter (HOSPITAL_COMMUNITY): Payer: Self-pay | Admitting: Cardiovascular Disease

## 2014-02-13 MED ORDER — HYDROCODONE-ACETAMINOPHEN 10-325 MG PO TABS: 1.0000 | ORAL_TABLET | ORAL | Status: DC | PRN

## 2014-02-13 NOTE — Telephone Encounter (Signed)
Patient aware that she can pick her prescription up on 02/14/2014.

## 2014-02-13 NOTE — Telephone Encounter (Signed)
We please call her and let her know it will be ready on Friday.

## 2014-02-13 NOTE — Telephone Encounter (Signed)
Spoke to patient and informed her that she can come and pick her prescription for hydrocodone tomorrow.

## 2014-02-13 NOTE — Telephone Encounter (Signed)
Patient states Amy called her to come pick up a script for hydrocodone tomorrow 12/11.  She states she was on MyChart and seen a message that her medicine had been denied.  Can you please follow up with her to confirm.

## 2014-02-14 ENCOUNTER — Ambulatory Visit: Payer: Medicare Other | Admitting: Internal Medicine

## 2014-02-24 ENCOUNTER — Other Ambulatory Visit: Payer: Self-pay | Admitting: Internal Medicine

## 2014-02-25 NOTE — Telephone Encounter (Signed)
Called pt. Pt stated that she has not had an eye exam done this year.

## 2014-02-28 ENCOUNTER — Other Ambulatory Visit: Payer: Self-pay | Admitting: Internal Medicine

## 2014-03-11 ENCOUNTER — Other Ambulatory Visit: Payer: Self-pay | Admitting: Internal Medicine

## 2014-03-12 ENCOUNTER — Ambulatory Visit (INDEPENDENT_AMBULATORY_CARE_PROVIDER_SITE_OTHER): Payer: Medicare Other | Admitting: Family Medicine

## 2014-03-12 DIAGNOSIS — Z5181 Encounter for therapeutic drug level monitoring: Secondary | ICD-10-CM

## 2014-03-12 DIAGNOSIS — I4891 Unspecified atrial fibrillation: Secondary | ICD-10-CM

## 2014-03-12 LAB — POCT INR: INR: 2.1

## 2014-03-14 ENCOUNTER — Encounter: Payer: Self-pay | Admitting: Internal Medicine

## 2014-03-17 ENCOUNTER — Other Ambulatory Visit: Payer: Self-pay | Admitting: Geriatric Medicine

## 2014-03-17 ENCOUNTER — Telehealth: Payer: Self-pay | Admitting: Internal Medicine

## 2014-03-17 MED ORDER — TELMISARTAN 40 MG PO TABS: 40.0000 mg | ORAL_TABLET | Freq: Every day | ORAL | Status: DC

## 2014-03-17 MED ORDER — GABAPENTIN 300 MG PO CAPS: 300.0000 mg | ORAL_CAPSULE | Freq: Three times a day (TID) | ORAL | Status: DC

## 2014-03-17 MED ORDER — HYDROCODONE-ACETAMINOPHEN 10-325 MG PO TABS: 1.0000 | ORAL_TABLET | ORAL | Status: DC | PRN

## 2014-03-17 NOTE — Telephone Encounter (Signed)
telmisartan (MICARDIS) 40 MG tablet  [facility] has not received this RX yet. Patient needs refilled today. Patient requesting 2 or 3 refills to be called in on this RX  Requests response via mychart

## 2014-03-17 NOTE — Telephone Encounter (Signed)
Sent to pharmacy 

## 2014-03-18 ENCOUNTER — Other Ambulatory Visit: Payer: Self-pay | Admitting: Geriatric Medicine

## 2014-03-18 MED ORDER — TELMISARTAN 40 MG PO TABS: 40.0000 mg | ORAL_TABLET | Freq: Every day | ORAL | Status: DC

## 2014-03-18 MED ORDER — GABAPENTIN 300 MG PO CAPS
300.0000 mg | ORAL_CAPSULE | Freq: Three times a day (TID) | ORAL | Status: DC
Start: 2014-03-18 — End: 2014-09-15

## 2014-03-19 NOTE — Telephone Encounter (Signed)
Found script for hydrocodone.  Disregard previous request.

## 2014-03-19 NOTE — Telephone Encounter (Signed)
Patient is also requesting refill on hydrocodone to pick up.

## 2014-03-20 DIAGNOSIS — M25561 Pain in right knee: Secondary | ICD-10-CM | POA: Diagnosis not present

## 2014-03-20 DIAGNOSIS — M25562 Pain in left knee: Secondary | ICD-10-CM | POA: Diagnosis not present

## 2014-03-20 DIAGNOSIS — M1711 Unilateral primary osteoarthritis, right knee: Secondary | ICD-10-CM | POA: Diagnosis not present

## 2014-03-20 DIAGNOSIS — M7542 Impingement syndrome of left shoulder: Secondary | ICD-10-CM | POA: Diagnosis not present

## 2014-03-21 ENCOUNTER — Telehealth: Payer: Self-pay | Admitting: Cardiovascular Disease

## 2014-03-21 ENCOUNTER — Ambulatory Visit: Payer: Medicare Other | Admitting: Internal Medicine

## 2014-03-21 NOTE — Telephone Encounter (Signed)
New message      Request for surgical clearance:  1. What type of surgery is being performed? Pt needs both knee replaced----not at the same time  2. When is this surgery scheduled? Not scheduled   3. Are there any medications that need to be held prior to surgery and how long? warfarin   Name of physician performing surgery? Dr Alvan Dame 4. What is your office phone and fax number?  Pt did not have numbers 5. Also need medical clearance for surgery.

## 2014-03-21 NOTE — Telephone Encounter (Signed)
To Dr. Burt Knack.

## 2014-03-22 NOTE — Telephone Encounter (Signed)
Pt last seen in September 2015. Had palpitations but no significant arrhythmia identified on event monitor. She can hold warfarin 5 days prior to surgery (no hx of stroke) and proceed without further cardiac testing.  Sherren Mocha 03/22/2014 6:42 AM

## 2014-03-25 NOTE — Telephone Encounter (Signed)
Chart reviewed and patient does have a history of stroke in 2004. She will require Lovenox bridging.

## 2014-03-26 ENCOUNTER — Encounter: Payer: Self-pay | Admitting: Internal Medicine

## 2014-03-26 NOTE — Telephone Encounter (Signed)
I spoke with the pt and made her aware of Dr Antionette Char comments.  The pt will contact the coumadin clinic with dates of surgery so they can manage lovenox bridging.  The pt has had INRs followed at [facility].

## 2014-04-02 ENCOUNTER — Other Ambulatory Visit: Payer: Self-pay | Admitting: Internal Medicine

## 2014-04-02 LAB — HM DIABETES FOOT EXAM: HM DIABETIC FOOT EXAM: NORMAL

## 2014-04-07 ENCOUNTER — Other Ambulatory Visit: Payer: Self-pay

## 2014-04-07 MED ORDER — WARFARIN SODIUM 6 MG PO TABS: ORAL_TABLET | ORAL | Status: DC

## 2014-04-07 NOTE — Telephone Encounter (Signed)
Rx request for warfarin sodium 6 mg tablets-Take as directed by anticoagulation clinic  Pharm:  [facility] [place] advise.

## 2014-04-08 ENCOUNTER — Ambulatory Visit (INDEPENDENT_AMBULATORY_CARE_PROVIDER_SITE_OTHER): Payer: Medicare Other

## 2014-04-08 ENCOUNTER — Telehealth: Payer: Self-pay | Admitting: Family

## 2014-04-08 ENCOUNTER — Ambulatory Visit (INDEPENDENT_AMBULATORY_CARE_PROVIDER_SITE_OTHER): Payer: Medicare Other | Admitting: General Practice

## 2014-04-08 DIAGNOSIS — Z23 Encounter for immunization: Secondary | ICD-10-CM

## 2014-04-08 DIAGNOSIS — Z5181 Encounter for therapeutic drug level monitoring: Secondary | ICD-10-CM | POA: Diagnosis not present

## 2014-04-08 LAB — POCT INR: INR: 2.2

## 2014-04-08 NOTE — Progress Notes (Signed)
Pre visit review using our clinic review tool, if applicable. No additional management support is needed unless otherwise documented below in the visit note. 

## 2014-04-08 NOTE — Telephone Encounter (Signed)
Agree with plan 

## 2014-04-11 ENCOUNTER — Encounter: Payer: Self-pay | Admitting: Internal Medicine

## 2014-04-11 ENCOUNTER — Ambulatory Visit (INDEPENDENT_AMBULATORY_CARE_PROVIDER_SITE_OTHER): Payer: Medicare Other | Admitting: Internal Medicine

## 2014-04-11 VITALS — BP 118/70 | HR 100 | Temp 98.0°F | Resp 14 | Wt 237.0 lb

## 2014-04-11 DIAGNOSIS — E1151 Type 2 diabetes mellitus with diabetic peripheral angiopathy without gangrene: Secondary | ICD-10-CM

## 2014-04-11 DIAGNOSIS — E1159 Type 2 diabetes mellitus with other circulatory complications: Secondary | ICD-10-CM

## 2014-04-11 LAB — HEMOGLOBIN A1C: Hgb A1c MFr Bld: 9 % — ABNORMAL HIGH (ref 4.6–6.5)

## 2014-04-11 MED ORDER — GLUCOPHAGE XR 500 MG PO TB24: 1000.0000 mg | ORAL_TABLET | Freq: Two times a day (BID) | ORAL | Status: DC

## 2014-04-11 NOTE — Patient Instructions (Signed)
Please continue: - Lantus 45 units at bedtime - Glipizide 5 mg before dinner - Victoza 1.8 mg daily Please add Metformin 500 mg with dinner x 4 days. If you tolerate this well, add another Metformin tablet (500 mg) with breakfast x 4 days. If you tolerate this well, add another metformin tablet with dinner (total 1000 mg) x 4 days. If you tolerate this well, add another metformin tablet with breakfast (total 1000 mg). Continue with 1000 mg of metformin 2x a day with breakfast and dinner.  Please stop at the lab.  Please return in 1.5 month with your sugar log.

## 2014-04-11 NOTE — Progress Notes (Signed)
Patient ID: Kristen [name], female   DOB: 10/26/1945, 69 y.o.   MRN: [id number]  HPI: Kristen [name] is a 69 y.o.-year-old female, returning for DM2, dx 2008, insulin-dependent, uncontrolled, with complications (CAD, PAD, h/o CVD, peripheral neuropathy). Last visit 7 mo ago.  Last hemoglobin A1c was: Lab Results  Component Value Date   HGBA1C 7.0* 09/27/2013   HGBA1C 8.7* 05/03/2013   HGBA1C 7.2* 11/14/2012  She had a steroid inj last month.  Pt is on a regimen of: - Lantus 45 units  - Glipizide 5 mg before dinner - Victoza 1.8 mg - added 03/2013 We tried NPH at one point. She was on Levemir pen 30 u nits 2x a day - gets the med directly from [facility] - but had to stop b/c of price. She is now Metformin XR 1000 mg bid b/c N/D and palpitations - retried with the same results She was on Bydureon in the past, then also on Novolog 60-70-75.  She tried Januvia before.   Pt checks her sugars 2-3x a day (no log and cannot remember the values well): - am: 90-115 >> 89-131 >> 90-100 >> 150-170 >> 70-120 (135) >> 90-120 >> 115-210 - 2h after b'fast: 116 >> 96-140 >> upper 200s - before lunch: 120-140 >> 82-125 >> 79 >> n/c >> 100-140 >> 103-199 >> 180-200 - before dinner: 99-168 >> n/c >> 80-120 >> 93-116 >> 187-210 - after dinner: 120-130 >> 250-300s, but now 180s >> 90-150s (higher with bread) >> 120-188 >> 200-upper 200s - bedtime: 110-120 >> 91-123, once 211 >> 90-100 >> n/c >> 130s >> see above No recent lows;  she has hypoglycemia awareness at 100. Highest: 300s - steroid.  Pt does not have chronic kidney disease, last BUN/creatinine was:  Lab Results  Component Value Date   BUN 20 09/27/2013   CREATININE 1.0 09/27/2013  On Telmisartan. Last set of lipids: Lab Results  Component Value Date   CHOL 119 12/07/2012   HDL 36* 12/07/2012   LDLCALC 60 12/07/2012   LDLDIRECT 116.0 11/28/2008   TRIG 115 12/07/2012   CHOLHDL 3.3 12/07/2012  On Lipitor. Pt's last eye  exam was last year. Reportingly no DR.  Denies numbness and tingling in her legs.  She also has a history of hypertension, paroxysmal A. Fib-on anticoagulation, She had a stress test on 11/14/2012 b/c palpitations and tachycardia (on Holter monitor) >> cath on 11/30/2012, depression, osteoarthritis - status post arthroscopic knee surgeries x2, status post lumbar laminectomy x3, hyperlipidemia, morbid obesity, chronic lymphadenitis, GERD, hypothyroidism, headaches, fibromyalgia.  ROS: Constitutional: + fatigue, + weight gain, + hot flashes, + nocturia, + poor sleep Eyes: + blurry vision, no xerophthalmia ENT: no sore throat, no nodules palpated in throat, no dysphagia/odynophagia, no hoarseness Cardiovascular: no CP/palpitations/+ SOB/no leg swelling Respiratory: + cough/+ SOB with activity  Gastrointestinal: no vomiting/diarrhea/constipation, but has nausea Musculoskeletal: + muscle aches/+ joint aches Skin: no rashes, + hair thinning Neurological: no tremors/numbness/tingling/dizziness, + headaches + Low libido  I reviewed pt's medications, allergies, PMH, social hx, family hx, and changes were documented in the history of present illness. Otherwise, unchanged from my initial visit note. Also, he switched from Dr. Arnoldo Morale to Dr. Doug Sou and she was taken off Paxil and Lexapro.   PE: BP 118/70 mmHg  Pulse 100  Temp(Src) 98 F (36.7 C) (Oral)  Resp 14  Wt 237 lb (107.502 kg)  SpO2 95% Wt Readings from Last 3 Encounters:  04/11/14 237 lb (107.502 kg)  02/07/14 238 lb 12.8  oz (108.319 kg)  12/19/13 228 lb 2 oz (103.477 kg)   Constitutional: overweight, in NAD, full supraclavicular fat pads Eyes: PERRLA, EOMI, no exophthalmos ENT: moist mucous membranes, no thyromegaly, no cervical lymphadenopathy Cardiovascular: RRR, No MRG Respiratory: CTA B Gastrointestinal: abdomen soft, NT, ND, BS+ Musculoskeletal: no deformities, strength intact in all 4 Skin: moist, warm, acanthosis  nigricans  ASSESSMENT: 1. DM2, insulin-dependent, uncontrolled, with complications - CAD- Echo 12/2010: EF 50-55%, status post CABG 2004: LIMA to LAD - cards: Dr. Lia Foyer. Also has a LBBB.  - PAD - h/o stroke - peripheral neuropathy - on neurontin  PLAN:  1. Patient with a several-year history of diabetes, on high-dose of basal insulin, and also Victoza and Glipizide. She had to come off metformin ER because of diarrhea and palpitations. This is surprising since she was doing very well on it before. I suggested to try the Glucophage XR brand name since the manufacturer of the metformin tablets really matters in tolerability of the product. She agrees to retry. Her sugars were wonderful on metformin, with an A1c of 7%. Based on her sugars, her hemoglobin A1c today will be higher. If she cannot tolerate metformin, we will probably need to increase glipizide to to switch to mealtime insulin.  - I advised her to Patient Instructions  Please continue: - Lantus 45 units at bedtime - Glipizide 5 mg before dinner - Victoza 1.8 mg daily Please add Metformin XR 500 mg with dinner x 4 days. If you tolerate this well, add another Metformin tablet (500 mg) with breakfast x 4 days. If you tolerate this well, add another metformin tablet with dinner (total 1000 mg) x 4 days. If you tolerate this well, add another metformin tablet with breakfast (total 1000 mg). Continue with 1000 mg of metformin 2x a day with breakfast and dinner.  Please stop at the lab.  Please return in 1.5 month with your sugar log.   - check HbA1C today - advised to schedule a new eye exam - given more sugar logs - RTC in 1.5 mo  Office Visit on 04/11/2014  Component Date Value Ref Range Status  . Hgb A1c MFr Bld 04/11/2014 9.0* 4.6 - 6.5 % Final   Glycemic Control Guidelines for People with Diabetes:Non Diabetic:  <6%Goal of Therapy: <7%Additional Action Suggested:  >8%    HbA1c higher >> see plan above.

## 2014-04-16 ENCOUNTER — Telehealth: Payer: Self-pay | Admitting: *Deleted

## 2014-04-16 ENCOUNTER — Telehealth: Payer: Self-pay | Admitting: Internal Medicine

## 2014-04-16 ENCOUNTER — Encounter: Payer: Self-pay | Admitting: Internal Medicine

## 2014-04-16 NOTE — Telephone Encounter (Signed)
Would like a call back in regards to when she needs to come back for a coum check.

## 2014-04-16 NOTE — Telephone Encounter (Signed)
OK 

## 2014-04-16 NOTE — Telephone Encounter (Signed)
Pt called stating that she could not afford the Glucophage XR. She decided to give the Metformin XL another chance. She is going to start out with one a day and then titrate up as she can tolerate it. She wanted Dr Cruzita Lederer to be aware of this. Be advised.

## 2014-04-17 ENCOUNTER — Telehealth: Payer: Self-pay | Admitting: *Deleted

## 2014-04-17 NOTE — Telephone Encounter (Signed)
Take a glipizide now, before lunch, then one before dinner. Take Lantus earlier tonight.

## 2014-04-17 NOTE — Telephone Encounter (Signed)
Called pt and advised her per Dr Gherghe's note. Pt understood.  

## 2014-04-17 NOTE — Telephone Encounter (Signed)
Pt called stating that she forgot to take her Lantus at bedtime. She has taken only the Victoza this morning and her bg was 312. Please advise.

## 2014-04-18 ENCOUNTER — Other Ambulatory Visit: Payer: Self-pay | Admitting: Geriatric Medicine

## 2014-04-18 MED ORDER — HYDROCODONE-ACETAMINOPHEN 10-325 MG PO TABS: 1.0000 | ORAL_TABLET | ORAL | Status: DC | PRN

## 2014-04-28 DIAGNOSIS — H2513 Age-related nuclear cataract, bilateral: Secondary | ICD-10-CM | POA: Diagnosis not present

## 2014-04-28 DIAGNOSIS — H35033 Hypertensive retinopathy, bilateral: Secondary | ICD-10-CM | POA: Diagnosis not present

## 2014-04-28 DIAGNOSIS — E11329 Type 2 diabetes mellitus with mild nonproliferative diabetic retinopathy without macular edema: Secondary | ICD-10-CM | POA: Diagnosis not present

## 2014-04-28 LAB — HM DIABETES EYE EXAM

## 2014-05-02 ENCOUNTER — Telehealth: Payer: Self-pay | Admitting: Cardiovascular Disease

## 2014-05-02 MED ORDER — NITROGLYCERIN 0.4 MG SL SUBL: 0.4000 mg | SUBLINGUAL_TABLET | SUBLINGUAL | Status: DC | PRN

## 2014-05-02 NOTE — Telephone Encounter (Signed)
Pt c/o of Chest Pain: 1. Are you having CP right now? No  2. Are you experiencing any other symptoms (ex. SOB, nausea, vomiting, sweating)? No, but if she is working she gets a little short of breath but that is on going, but she believes that this is due to weight 3. How long have you been experiencing CP? Within 24 hours  4. Is your CP continuous or coming and going? Coming and Going... Every 30 minutes  5. Have you taken Nitroglycerin? No... She has the nitro's but she finds that the pains do not last. She has a sharp pain and then it stops  Comment; She has had a med change. 154/104 BP went up she took a zantac per primary care. She believes that she has stress and anxiety attacks but she is not sure. Please call back to discuss

## 2014-05-02 NOTE — Telephone Encounter (Signed)
I spoke with the patient. She reports having an episode of chest pain about 15 minutes prior to her call. She describes this as 1 sharp pain. She had another episode about 30 minutes prior to that. She had a similar episode in Dr. Rachael Fee office on Monday. BP was checked and it was 158/104, which is high for her per her report. The patient states she took 1/2 tablet of xanax in Dr. Rachael Fee office on Monday and this helped to relieve her symptoms. She then proceeded to tell me that she has seen Dr. Arnoldo Morale as her PCP for over 20 years. She has a history of anxiety. She has been taking xanax 1 mg TID for 23 years. She has started seeing Dr. Doug Sou as her PCP. The patient reports that Dr. Doug Sou wants to start weaning her off the xanax as she feels this dose is unsafe for the patient. Dr. Doug Sou also wanted her to see physchiatry at least once to get her on a correct regimen of meds for her anxiety, but she states she can't afford this. The patient reports that Dr. Arnoldo Morale has tried her on different anxiety meds in the past and they do not work for her. She has been trying to take Xanax 1 mg 1/2 tablet BID lately, but if she takes this dose one day, the next day she feels like she needs to take the for 1 mg TID dosing. She took 1 mg of Xanax about 6 am today, and then another 1/2 tablet at 2:30 pm when her symptoms started. The patient was pain free when I was speaking with her. I have advised her to monitor symptoms, and if they re-occur, to try a NTG to see if this helps. If her pain becomes persistent, I have advised her to report to the ER for evaluation since she has known CAD. She verbalizes understanding of this and is agreeable. I have also advised that she touch base with Dr. Doug Sou to address Xanax dosing again.

## 2014-05-08 ENCOUNTER — Encounter: Payer: Self-pay | Admitting: Internal Medicine

## 2014-05-09 ENCOUNTER — Other Ambulatory Visit: Payer: Self-pay | Admitting: Geriatric Medicine

## 2014-05-09 DIAGNOSIS — M25561 Pain in right knee: Secondary | ICD-10-CM | POA: Diagnosis not present

## 2014-05-09 DIAGNOSIS — M7542 Impingement syndrome of left shoulder: Secondary | ICD-10-CM | POA: Diagnosis not present

## 2014-05-09 DIAGNOSIS — M170 Bilateral primary osteoarthritis of knee: Secondary | ICD-10-CM | POA: Diagnosis not present

## 2014-05-09 DIAGNOSIS — M25562 Pain in left knee: Secondary | ICD-10-CM | POA: Diagnosis not present

## 2014-05-09 MED ORDER — ALPRAZOLAM 1 MG PO TABS: 1.0000 mg | ORAL_TABLET | Freq: Three times a day (TID) | ORAL | Status: DC | PRN

## 2014-05-12 ENCOUNTER — Other Ambulatory Visit: Payer: Self-pay | Admitting: Geriatric Medicine

## 2014-05-12 ENCOUNTER — Encounter: Payer: Self-pay | Admitting: Internal Medicine

## 2014-05-16 ENCOUNTER — Other Ambulatory Visit: Payer: Self-pay | Admitting: Internal Medicine

## 2014-05-16 ENCOUNTER — Other Ambulatory Visit: Payer: Self-pay | Admitting: Geriatric Medicine

## 2014-05-16 MED ORDER — HYDROCODONE-ACETAMINOPHEN 10-325 MG PO TABS: 1.0000 | ORAL_TABLET | ORAL | Status: DC | PRN

## 2014-05-17 DIAGNOSIS — M7542 Impingement syndrome of left shoulder: Secondary | ICD-10-CM | POA: Diagnosis not present

## 2014-05-20 ENCOUNTER — Ambulatory Visit (INDEPENDENT_AMBULATORY_CARE_PROVIDER_SITE_OTHER): Payer: Medicare Other | Admitting: General Practice

## 2014-05-20 DIAGNOSIS — Z5181 Encounter for therapeutic drug level monitoring: Secondary | ICD-10-CM

## 2014-05-20 LAB — POCT INR: INR: 2.8

## 2014-05-20 NOTE — Progress Notes (Signed)
Pre visit review using our clinic review tool, if applicable. No additional management support is needed unless otherwise documented below in the visit note. 

## 2014-05-20 NOTE — Progress Notes (Signed)
Agree with plan 

## 2014-05-23 ENCOUNTER — Ambulatory Visit (INDEPENDENT_AMBULATORY_CARE_PROVIDER_SITE_OTHER): Payer: Medicare Other | Admitting: Internal Medicine

## 2014-05-23 ENCOUNTER — Encounter: Payer: Self-pay | Admitting: Internal Medicine

## 2014-05-23 VITALS — BP 110/68 | HR 92 | Temp 98.5°F | Resp 14 | Wt 235.0 lb

## 2014-05-23 DIAGNOSIS — E1159 Type 2 diabetes mellitus with other circulatory complications: Secondary | ICD-10-CM | POA: Diagnosis not present

## 2014-05-23 DIAGNOSIS — E1151 Type 2 diabetes mellitus with diabetic peripheral angiopathy without gangrene: Secondary | ICD-10-CM

## 2014-05-23 NOTE — Patient Instructions (Signed)
Please continue: - Lantus 45 units at bedtime - Victoza 1.8 mg daily - Metformin XR 1000 mg 2x a day  Please restart: - Glipizide 5 mg before dinner  Please come back for a follow-up appointment in 2 months.

## 2014-05-23 NOTE — Progress Notes (Signed)
Patient ID: Kristen [name], female   DOB: 06/13/1945, 69 y.o.   MRN: [id number]  HPI: Kristen [name] is a 69 y.o.-year-old female, returning for DM2, dx 2008, insulin-dependent, uncontrolled, with complications (CAD, PAD, h/o CVD, peripheral neuropathy). Last visit 1.5 mo ago.  Last hemoglobin A1c was: Lab Results  Component Value Date   HGBA1C 9.0* 04/11/2014   HGBA1C 7.0* 09/27/2013   HGBA1C 8.7* 05/03/2013  She had a steroid inj last month.  Pt is on a regimen of: - Lantus 45 units at hs - Glipizide 5 mg before dinner - Victoza 1.8 mg - added 03/2013 - Metformin XR 1000 mg bid - restarted 04/2014 We tried NPH at one point. She was on Levemir pen 30 u nits 2x a day - gets the med directly from [facility] - but had to stop b/c of price. She is now Metformin XR 1000 mg bid b/c N/D and palpitations - retried with the same results She was on Bydureon in the past, then also on Novolog 60-70-75.  She tried Januvia before.   Pt checks her sugars 2-3x a day (no log and cannot remember the values well): - am: 90-100 >> 150-170 >> 70-120 (135) >> 90-120 >> 115-210 >> 89, 112-147, 187 - 2h after b'fast: 116 >> 96-140 >> upper 200s >> 135-165, 202 - before lunch: 82-125 >> 79 >> n/c >> 100-140 >> 103-199 >> 180-200 >> 120-148, 194 - 2h after lunch: 111-204 - before dinner: 99-168 >> n/c >> 80-120 >> 93-116 >> 187-210 >> 101-177, 214 - after dinner: 120-188 >> 200-upper 200s >> 108, 136-180, 230 - bedtime: 90-100 >> n/c >> 130s >> see above >> 112-168, 208 No recent lows;  she has hypoglycemia awareness at 100. Highest: 300s - steroid.  Pt does not have chronic kidney disease, last BUN/creatinine was:  Lab Results  Component Value Date   BUN 20 09/27/2013   CREATININE 1.0 09/27/2013  On Telmisartan. Last set of lipids: Lab Results  Component Value Date   CHOL 119 12/07/2012   HDL 36* 12/07/2012   LDLCALC 60 12/07/2012   LDLDIRECT 116.0 11/28/2008   TRIG 115 12/07/2012    CHOLHDL 3.3 12/07/2012  On Lipitor. Pt's last eye exam was in 2015. Reportingly no DR.  Denies numbness and tingling in her legs.  She also has a history of hypertension, paroxysmal A. Fib-on anticoagulation, She had a stress test on 11/14/2012 b/c palpitations and tachycardia (on Holter monitor) >> cath on 11/30/2012, depression, osteoarthritis - status post arthroscopic knee surgeries x2, status post lumbar laminectomy x3, hyperlipidemia, morbid obesity, chronic lymphadenitis, GERD, hypothyroidism, headaches, fibromyalgia.  ROS: Constitutional: + fatigue, + weight gain, + hot flashes, no nocturia, + poor sleep Eyes: + blurry vision, no xerophthalmia ENT: no sore throat, no nodules palpated in throat, no dysphagia/odynophagia, no hoarseness Cardiovascular: + CP/no palpitations/+ SOB/no leg swelling Respiratory: no cough/+ SOB with activity  Gastrointestinal: no vomiting/diarrhea/constipation Musculoskeletal: + muscle aches/+ joint aches Skin: no rashes, + itching Neurological: no tremors/numbness/tingling/dizziness, no headaches + Low libido  I reviewed pt's medications, allergies, PMH, social hx, family hx, and changes were documented in the history of present illness. Otherwise, unchanged from my initial visit note.   PE: BP 110/68 mmHg  Pulse 92  Temp(Src) 98.5 F (36.9 C) (Oral)  Resp 14  Wt 235 lb (106.595 kg)  SpO2 95% Wt Readings from Last 3 Encounters:  05/23/14 235 lb (106.595 kg)  04/11/14 237 lb (107.502 kg)  02/07/14 238 lb 12.8 oz (108.319 kg)  Constitutional: overweight, in NAD, full supraclavicular fat pads Eyes: PERRLA, EOMI, no exophthalmos ENT: moist mucous membranes, no thyromegaly, no cervical lymphadenopathy Cardiovascular: RRR, No MRG Respiratory: CTA B Gastrointestinal: abdomen soft, NT, ND, BS+ Musculoskeletal: no deformities, strength intact in all 4 Skin: moist, warm, acanthosis nigricans  ASSESSMENT: 1. DM2, insulin-dependent, uncontrolled,  with complications - CAD- Echo 12/2010: EF 50-55%, status post CABG 2004: LIMA to LAD - cards: Dr. Lia Foyer. Also has a LBBB.  - PAD - h/o stroke - peripheral neuropathy - on neurontin  PLAN:  1. Patient with a several-year history of diabetes, on high-dose of basal insulin, and also Victoza and Glipizide. She had to come off metformin ER because of diarrhea and palpitations. This was surprising since she was doing very well on it before. I suggested to try the Glucophage XR brand name since the manufacturer of the metformin tablets really matters in tolerability of the product. She agreed to do this, however, Glucophage XR DAW was expensive and she got a new Metformin ER batch >> she is tolerating this very well and sugars are much better. They are higher at night >> restart Glipizide before dinner (not clear why she stopped). She also continues to work on her diet. - I advised her to Patient Instructions  Please continue: - Lantus 45 units at bedtime - Victoza 1.8 mg daily - Metformin XR 1000 mg 2x a day  Please restart: - Glipizide 5 mg before dinner  Please come back for a follow-up appointment in 2 months.   - check HbA1C at next visit - up to date with eye exams - given more sugar logs - RTC in 2 mo

## 2014-05-28 ENCOUNTER — Encounter: Payer: Self-pay | Admitting: Cardiovascular Disease

## 2014-05-28 ENCOUNTER — Ambulatory Visit (INDEPENDENT_AMBULATORY_CARE_PROVIDER_SITE_OTHER): Payer: Medicare Other | Admitting: Cardiovascular Disease

## 2014-05-28 ENCOUNTER — Other Ambulatory Visit: Payer: Self-pay | Admitting: Internal Medicine

## 2014-05-28 VITALS — BP 115/68 | HR 77 | Ht 61.0 in | Wt 234.6 lb

## 2014-05-28 DIAGNOSIS — E785 Hyperlipidemia, unspecified: Secondary | ICD-10-CM

## 2014-05-28 DIAGNOSIS — I4891 Unspecified atrial fibrillation: Secondary | ICD-10-CM | POA: Diagnosis not present

## 2014-05-28 DIAGNOSIS — I10 Essential (primary) hypertension: Secondary | ICD-10-CM

## 2014-05-28 NOTE — Progress Notes (Signed)
Cardiology Office Note   Date:  05/30/2014   ID:  Kristen [name], DOB Feb 16, 1946, MRN [id number]  PCP:  Olga Millers, MD  Cardiologist:  Sherren Mocha, MD    Chief Complaint  Patient presents with  . Chest Pain     History of Present Illness: Kristen [name] is a 69 y.o. female who presents for followup evaluation. She has multiple medical problems. She is followed here because of coronary artery disease with history of CABG in 2004. She underwent cardiac catheterization in 2014 demonstrating stable CAD and continued patency of her LIMA graft. Other problems include paroxysmal atrial fibrillation, history of stroke, morbid obesity, diabetes, hypertension, and left bundle branch block. Her LV function has been within normal limits.   She's had some problems with panic/anxiety recently. Had a 'panic attack' at a doctor's visit recently. She has chest pain associated with these episodes. NO major change in frequency or severity of symptoms. Her PCP has tried to titrate her off of Xanax but she doesn't feel like she's tolerating this well. No change in chronic DOE. No edema, orthopnea, or PND.    Past Medical History  Diagnosis Date  . CAD (coronary artery disease)   . PAF (paroxysmal atrial fibrillation)   . Hypertension   . Hyperlipidemia   . Hypokalemia   . Diabetes mellitus   . Depression   . Lumbar back pain   . LBBB (left bundle branch block)   . Headache(784.0)   . Lymphadenitis   . Arthritis   . Bursitis   . Chronic anticoagulation     on coumadin  . Heart palpitations   . Diastolic dysfunction     per echo in October 2012 with EF 50 to 55%  . 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 GERD (gastroesophageal reflux disease) 10/23/2003  . Fibromyalgia   . Blood transfusion without reported diagnosis     Past Surgical History  Procedure Laterality Date  . Angioplasty  laminectomy  . Lumbar laminectomy      x3  . Coronary artery bypass graft       LIMA to LAD   . Abdominal hysterectomy    . Cholecystectomy    . Tonsillectomy    . Knee arthroscopy    . Sphincterotomy    . Left heart catheterization with coronary/graft angiogram  12/07/2012    Procedure: LEFT HEART CATHETERIZATION WITH Beatrix Fetters;  Surgeon: Blane Ohara, MD;  Location: [facility] CATH LAB;  Service: Cardiovascular;;    Current Outpatient Prescriptions  Medication Sig Dispense Refill  . ALPRAZolam (XANAX) 1 MG tablet Take 1 tablet (1 mg total) by mouth 3 (three) times daily as needed for anxiety. 90 tablet 1  . atorvastatin (LIPITOR) 40 MG tablet Take 1 tablet (40 mg total) by mouth daily. 90 tablet 3  . clotrimazole-betamethasone (LOTRISONE) cream Apply 1 application topically daily as needed (for itching).    . fluocinonide cream (LIDEX) 5.63 % Apply 1 application topically as needed (For itching).     . furosemide (LASIX) 20 MG tablet TAKE 1 TABLET BY MOUTH EVERY DAY 30 tablet 9  . gabapentin (NEURONTIN) 300 MG capsule Take 1 capsule (300 mg total) by mouth 3 (three) times daily. 90 capsule 3  . glipiZIDE (GLUCOTROL) 5 MG tablet TAKE 1 TABLET BY MOUTH BEFORE DINNER AS DIRECTED 30 tablet 2  . GLUCOPHAGE XR 500 MG 24 hr tablet Take 2 tablets (1,000 mg total) by mouth 2 (two) times daily with  a meal. 120 tablet 2  . HYDROcodone-acetaminophen (NORCO) 10-325 MG per tablet Take 1 tablet by mouth every 4 (four) hours as needed (pain). 150 tablet 0  . insulin glargine (LANTUS) 100 UNIT/ML injection Inject 45 Units into the skin at bedtime.    . Liraglutide (VICTOZA) 18 MG/3ML SOPN Inject 1.8 mg into the skin daily.    . nitroGLYCERIN (NITROSTAT) 0.4 MG SL tablet Place 1 tablet (0.4 mg total) under the tongue every 5 (five) minutes as needed. For chest pain 30 tablet 3  . ONE TOUCH ULTRA TEST test strip     . potassium chloride SA (K-DUR,KLOR-CON) 20 MEQ tablet Take 20 mEq by mouth 2 (two) times daily.    Marland Kitchen spironolactone (ALDACTONE) 25 MG tablet TAKE 1 TABLET BY  MOUTH TWICE DAILY 60 tablet 5  . telmisartan (MICARDIS) 40 MG tablet Take 1 tablet (40 mg total) by mouth daily. 30 tablet 5  . warfarin (COUMADIN) 6 MG tablet Take as directed by anticoagulation clinic 30 tablet 3  . atenolol (TENORMIN) 100 MG tablet TAKE ONE TABLET BY MOUTH EVERY DAY 90 tablet 1  . hydrocortisone 2.5 % ointment   3  . potassium chloride SA (K-DUR,KLOR-CON) 20 MEQ tablet TAKE 1 TABLET BY MOUTH TWICE DAILY 60 tablet 6   No current facility-administered medications for this visit.    Allergies:   Metformin and related; Cleocin; Codeine; Doxycycline hyclate; Macrolides and ketolides; Morphine; Pentazocine lactate; Vibramycin; and Sulfonamide derivatives   Social History:  The patient  reports that she quit smoking about 13 years ago. She has never used smokeless tobacco. She reports that she does not drink alcohol or use illicit drugs.   Family History:  The patient's family history includes Arthritis in an other family member; Colon cancer in her paternal uncle; Diabetes in an other family member; Heart attack in her father; Heart disease in her father; Hypertension in an other family member; Kidney disease in an other family member; Stroke in her mother and another family member.    ROS:  Please see the history of present illness.  Otherwise, review of systems is positive for chest pain, visual disturbance, DOE, depression, back pain, muscle pain, easy bruising, excessive sweating, leg pain, palpitations, snoring, anxiety, and joint swelling.  All other systems are reviewed and negative.    PHYSICAL EXAM: VS:  BP 115/68 mmHg  Pulse 77  Ht 5\' 1"  (1.549 m)  Wt 234 lb 9.6 oz (106.414 kg)  BMI 44.35 kg/m2 , BMI Body mass index is 44.35 kg/(m^2). GEN: Well nourished, well developed, in no acute distress HEENT: normal Neck: no JVD, no masses. No carotid bruits Cardiac: RRR without murmur or gallop                Respiratory:  clear to auscultation bilaterally, normal work of  breathing GI: soft, nontender, nondistended, + BS MS: no deformity or atrophy Ext: no pretibial edema, pedal pulses 2+= bilaterally Skin: warm and dry, no rash Neuro:  Strength and sensation are intact Psych: euthymic mood, full affect  EKG:  EKG is ordered today. The ekg ordered today shows NSR with LBBB  Recent Labs: 09/27/2013: ALT 15; BUN 20; Creatinine 1.0; Potassium 4.2; Sodium 139; TSH 0.45   Lipid Panel     Component Value Date/Time   CHOL 119 12/07/2012 0844   TRIG 115 12/07/2012 0844   HDL 36* 12/07/2012 0844   CHOLHDL 3.3 12/07/2012 0844   VLDL 23 12/07/2012 0844   LDLCALC 60 12/07/2012  0844   LDLDIRECT 116.0 11/28/2008 1122      Wt Readings from Last 3 Encounters:  05/28/14 234 lb 9.6 oz (106.414 kg)  05/23/14 235 lb (106.595 kg)  04/11/14 237 lb (107.502 kg)    ASSESSMENT AND PLAN: 1.  CAD, native vessel: The patient had continued patency of her LIMA to LAD graft at the time of her last heart catheterization. There is no other high-grade obstructive disease visualized. She will continue with medical therapy as she remained stable from a cardiac perspective. She does have occasional anginal symptoms related to stress and anxiety. Her graft   2. Essential hypertension: Blood pressure is controlled on her current medical program. Lifestyle modification has been reviewed extensively.  3. Chronic diastolic heart failure, New York Heart Association functional class II. She will continue on her current medical program.  4. Hyperlipidemia: She remains on atorvastatin.  5. Paroxysmal atrial fibrillation: The patient is maintaining sinus rhythm and she is tolerating anticoagulation with warfarin.  6. Heart palpitations: No change in symptoms. Event monitor from 2015 has been reviewed and demonstrated sinus rhythm with occasional PVCs.  Current medicines are reviewed with the patient today.  The patient does not have concerns regarding medicines.  The following changes  have been made:  no change  Labs/ tests ordered today include:   Orders Placed This Encounter  Procedures  . EKG 12-Lead    Disposition:   FU 6 months  Signed, Sherren Mocha, MD  05/30/2014 10:51 PM    [facility] [place], [place], [place]  [address] Phone: [phone]; Fax: [phone]

## 2014-05-28 NOTE — Patient Instructions (Signed)
Your physician wants you to follow-up in: 6 MONTHS with Dr Cooper.  You will receive a reminder letter in the mail two months in advance. If you don't receive a letter, please call our office to schedule the follow-up appointment.  Your physician recommends that you continue on your current medications as directed. Please refer to the Current Medication list given to you today.  

## 2014-05-29 ENCOUNTER — Other Ambulatory Visit: Payer: Self-pay | Admitting: Cardiovascular Disease

## 2014-06-02 ENCOUNTER — Ambulatory Visit: Payer: Self-pay | Admitting: Internal Medicine

## 2014-06-02 ENCOUNTER — Telehealth: Payer: Self-pay | Admitting: Internal Medicine

## 2014-06-02 ENCOUNTER — Telehealth: Payer: Self-pay | Admitting: *Deleted

## 2014-06-02 ENCOUNTER — Encounter: Payer: Self-pay | Admitting: Internal Medicine

## 2014-06-02 ENCOUNTER — Ambulatory Visit (INDEPENDENT_AMBULATORY_CARE_PROVIDER_SITE_OTHER): Payer: Medicare Other | Admitting: Internal Medicine

## 2014-06-02 VITALS — BP 100/52 | HR 88 | Temp 98.2°F | Ht 61.0 in | Wt 237.0 lb

## 2014-06-02 DIAGNOSIS — M5010 Cervical disc disorder with radiculopathy, unspecified cervical region: Secondary | ICD-10-CM | POA: Diagnosis not present

## 2014-06-02 DIAGNOSIS — R609 Edema, unspecified: Secondary | ICD-10-CM | POA: Diagnosis not present

## 2014-06-02 DIAGNOSIS — M67912 Unspecified disorder of synovium and tendon, left shoulder: Secondary | ICD-10-CM | POA: Diagnosis not present

## 2014-06-02 MED ORDER — AMITRIPTYLINE HCL 10 MG PO TABS: 10.0000 mg | ORAL_TABLET | Freq: Every day | ORAL | Status: DC

## 2014-06-02 NOTE — Progress Notes (Signed)
Pre visit review using our clinic review tool, if applicable. No additional management support is needed unless otherwise documented below in the visit note. 

## 2014-06-02 NOTE — Patient Instructions (Addendum)
Assess response of pain to the Amitriptyline 10 mg @ bedtime.

## 2014-06-02 NOTE — Telephone Encounter (Signed)
PLEASE NOTE: All timestamps contained within this report are represented as [place] Standard Time. CONFIDENTIALTY NOTICE: This fax transmission is intended only for the addressee. It contains information that is legally privileged, confidential or otherwise protected from use or disclosure. If you are not the intended recipient, you are strictly prohibited from reviewing, disclosing, copying using or disseminating any of this information or taking any action in reliance on or regarding this information. If you have received this fax in error, please notify us immediately by telephone so that we can arrange for its return to [place]. Phone: [phone], Toll-Free: [phone], Fax: [phone] Page: 1 of 1 Call Id: [id number] [facility] Client [place] Patient Name: Kristen [name] DOB: 18-Nov-1945 Initial Comment Caller states she is having a lot of swelling in left arm. Has been seeing orthopedic MD for this. Today is painful. Can feel it coming down side. Indents when she puts finger in it. Nurse Assessment Nurse: Donalynn Furlong, RN, Myna Hidalgo Date/Time Eilene Ghazi Time): 06/02/2014 11:26:42 AM Confirm and document reason for call. If symptomatic, describe symptoms. ---Caller states she is having a lot of swelling in left arm. Has been seeing orthopedic MD for this.(last seen 3-4 weeks ago ) Pt had MRI and was told to start PT. Today is painful. Can feel it coming down side. Indents when she puts finger in it. Has the patient traveled out of the country within the last 30 days? ---No Does the patient require triage? ---Yes Related visit to physician within the last 2 weeks? ---No Does the PT have any chronic conditions? (i.e. diabetes, asthma, etc.) ---Yes List chronic conditions. ---A fib/ takes Warfarin, high blood pressre , CAD, Prev Stroke Guidelines Guideline Title Affirmed Question Affirmed Notes Arm Pain Entire arm  is swollen Final Disposition User See Physician within 4 Hours (or PCP triage) Donalynn Furlong, RN, Myna Hidalgo Comments appt made for today at 1 pm at [facility] with Dr Linna Darner

## 2014-06-02 NOTE — Telephone Encounter (Signed)
[facility] [place] [facility] Patient Name: Kristen [name] Gender: Female DOB: 05-06-1945 Age: 69 Y 70 M 5 D Return Phone Number: [phone] (Primary) Address: [address] City/State/Zip: [place] [address] Client [facility] - Client Client [facility] [place] - Day Physician [place], [place] Type Call Call Type Triage / Clinical Relationship To Patient Self Appointment Disposition EMR Appointment Scheduled Info pasted into Epic Yes Return Phone Number [phone] (Primary) Chief Complaint Arm Pain Initial Comment Caller states she is having a lot of swelling in left arm. Has been seeing orthopedic MD for this. Today is painful. Can feel it coming down side. Indents when she puts finger in it. PreDisposition Call Doctor Nurse Assessment Nurse: Donalynn Furlong, RN, Myna Hidalgo Date/Time Eilene Ghazi Time): 06/02/2014 11:26:42 AM Confirm and document reason for call. If symptomatic, describe symptoms. ---Caller states she is having a lot of swelling in left arm. Has been seeing orthopedic MD for this.(last seen 3-4 weeks ago ) Pt had MRI and was told to start PT. Today is painful. Can feel it coming down side. Indents when she puts finger in it. Has the patient traveled out of the country within the last 30 days? ---No Does the patient require triage? ---Yes Related visit to physician within the last 2 weeks? ---No Does the PT have any chronic conditions? (i.e. diabetes, asthma, etc.) ---Yes List chronic conditions. ---A fib/ takes Warfarin, high blood pressre , CAD, Prev Stroke Guidelines Guideline Title Affirmed Question Affirmed Notes Nurse Date/Time Eilene Ghazi Time) Arm Pain Entire arm is swollen Donalynn Furlong, RN, Myna Hidalgo 06/02/2014 11:32:22 AM Disp. Time Eilene Ghazi Time) Disposition Final User 06/02/2014 11:40:04 AM See Physician within 4 Hours (or PCP  triage) Yes Donalynn Furlong, RN, Myna Hidalgo Caller Understands: Yes PLEASE NOTE: All timestamps contained within this report are represented as [place] Standard Time. CONFIDENTIALTY NOTICE: This fax transmission is intended only for the addressee. It contains information that is legally privileged, confidential or otherwise protected from use or disclosure. If you are not the intended recipient, you are strictly prohibited from reviewing, disclosing, copying using or disseminating any of this information or taking any action in reliance on or regarding this information. If you have received this fax in error, please notify us immediately by telephone so that we can arrange for its return to [place]. Phone: [phone], Toll-Free: [phone], Fax: [phone] Page: 2 of 2 Call Id: [id number] Disagree/Comply: Comply Care Advice Given Per Guideline SEE PHYSICIAN WITHIN 4 HOURS (or PCP triage): PAIN MEDICINES: ACETAMINOPHEN (E.G., TYLENOL): * Take 650 mg (two 325 mg pills) by mouth every 4-6 hours as needed. Each Regular Strength Tylenol pill has 325 mg of acetaminophen. The most you should take each day is 3,250 mg (10 Regular Strength pills a day). IBUPROFEN (E.G., MOTRIN, ADVIL): * Take 400 mg (two 200 mg pills) by mouth every 6 hours as needed. CALL BACK IF: * You become worse. CARE ADVICE given per Arm Pain (Adult) guideline. After Care Instructions Given Call Event Type User Date / Time Description Comments User: Gennie Alma, RN Date/Time Eilene Ghazi Time): 06/02/2014 11:41:13 AM appt made for today at 1 pm at [facility] with Dr Linna Darner Referrals REFERRED TO PCP OFFICE

## 2014-06-02 NOTE — Progress Notes (Signed)
   Subjective:    Patient ID: Kristen [name], female    DOB: 06/16/1945, 69 y.o.   MRN: [id number]  HPI She has had what she feels is intermittent, non dependent swelling of her left upper extremity in scattered distribution for 2-4 years.She delineated involvement of the triceps , upper humeral area and part of the forearm @ various toimes.   Initially her symptoms were attributed to cervical arthritis as her cervical spine films demonstrated degenerative disease at multiple levels with osteophytes. She now has pain  described as sharp up to level 8-10. It radiates from the shoulder to the elbow and hand. It is constant in nature. She is unable to raise her left upper extremity above her head. She has resorted to carrying items in the right hand.  Ice did not help the swelling. She feels ice sometimes increases the pain.  She had rotator cuff repair 18-20 years ago; she awaits results of MRI done by the orthopedist to determine recurrence of disease  She has associated muscle cramping, weakness,&  Tingling in her left upper extremity.  Cymbalta tried and failed.  She has a history of fibromyalgia.  She also has a history of atrial fibrillation . She is on warfarin. Her PT/INR was therapeutic 3/18 with a value of 2.8.   Review of Systems There is no associated rash or change in color or temperature of the skin in the area the symptoms.  There is no associated fever, chills, sweats, or weight loss. She has no incontinence of urine or stool.      Objective:   Physical Exam Pertinent or positive findings include: She is decreased range of motion cervical spine.  She has pain in the thumb to opposition of the left index finger to thumb .  I can appreciate no cervical or axillary lymphadenopathy but she was tender to palpation areas in both areas. She was unable to elevate left upper extremity above shoulder level due to severe pain.    General appearance :adequately  nourished; in no distress. Eyes: No conjunctival inflammation or scleral icterus is present. Heart:  Normal rate and regular rhythm. S1 and S2 normal without gallop, murmur, click,or  rub .She has an S4 Lungs:Chest clear to auscultation; no wheezes, rhonchi,rales ,or rubs present.No increased work of breathing.  Vascular : all pulses equal ; no bruits present. Skin:Warm & dry.  Intact without suspicious lesions or rashes ; no tenting  Neuro: Strength, tone & DTRs normal.         Assessment & Plan:  #1 cervical radiculopathy  #2 rotator cuff syndrome  #3 fibromyalgia  #4 subjective asymmetric, non dependent edema of LUE  Plan: See orders and recommendations

## 2014-06-03 ENCOUNTER — Telehealth: Payer: Self-pay | Admitting: *Deleted

## 2014-06-03 NOTE — Telephone Encounter (Signed)
Yes, let's decrease Lantus to 30 units for now - let us know how sugars are in 2-3 days.

## 2014-06-03 NOTE — Telephone Encounter (Signed)
Pt called stating that her blood sugar dropped on Sunday am around 3:30 am. She woke up and checked her blood sugar and it was 64. She ate something and it went back up some. Pt had this incident to happen again this am. She was awoken and checked her bg it was 72. She then ate some graham crackers and drank some oj, it dropped to 64. Approx 40-45 min later it rose to 85. She went back to sleep. When pt awoke her bg was 120. Her bedtime reading was 100. Pt only did 22 units of Lantus because she was concerned that her bg was low. Pt wants to know if eating the graham crackers and drinking the oj is ok. Pt also wants to know if she needs to lower her insulin at bedtime. Please advise.

## 2014-06-04 NOTE — Telephone Encounter (Signed)
Called pt and advised her per Dr Arman Filter note. Pt stated that her bg was 76 last night before bed and she did not take any insulin (Lantus). She did not wake through the night. Her fasting bg this AM was 118. Pt to call on Friday AM to let us know how she is doing.

## 2014-06-06 ENCOUNTER — Other Ambulatory Visit: Payer: Self-pay | Admitting: Internal Medicine

## 2014-06-06 DIAGNOSIS — M2012 Hallux valgus (acquired), left foot: Secondary | ICD-10-CM | POA: Diagnosis not present

## 2014-06-09 ENCOUNTER — Other Ambulatory Visit: Payer: Self-pay | Admitting: *Deleted

## 2014-06-09 DIAGNOSIS — M75112 Incomplete rotator cuff tear or rupture of left shoulder, not specified as traumatic: Secondary | ICD-10-CM | POA: Diagnosis not present

## 2014-06-09 DIAGNOSIS — M19012 Primary osteoarthritis, left shoulder: Secondary | ICD-10-CM | POA: Diagnosis not present

## 2014-06-09 MED ORDER — GLUCOSE BLOOD VI STRP: ORAL_STRIP | Status: DC

## 2014-06-17 ENCOUNTER — Telehealth: Payer: Self-pay | Admitting: Internal Medicine

## 2014-06-17 NOTE — Telephone Encounter (Signed)
Stop the glipizide and Insulin completely and call in 3 days with sugars >> we may need to restart insulin back at a lower dose then.

## 2014-06-17 NOTE — Telephone Encounter (Signed)
Please read message below and advise.  

## 2014-06-17 NOTE — Telephone Encounter (Signed)
For the last couple nights pt has had dropping BS into the 60s Friday went to 63 in middle of the night; Saturday it was 69 at 3:08 AM; Sunday 56 at 1:48 AM; Monday night she did not insulin and her fasting this AM at 6:15 AM 68.

## 2014-06-17 NOTE — Telephone Encounter (Signed)
Called pt and advised her per Dr Gherghe's message below. Pt voiced understanding.  

## 2014-06-20 ENCOUNTER — Ambulatory Visit (INDEPENDENT_AMBULATORY_CARE_PROVIDER_SITE_OTHER): Payer: Medicare Other | Admitting: Internal Medicine

## 2014-06-20 ENCOUNTER — Encounter: Payer: Self-pay | Admitting: Internal Medicine

## 2014-06-20 ENCOUNTER — Telehealth: Payer: Self-pay | Admitting: Internal Medicine

## 2014-06-20 VITALS — BP 122/80 | HR 88 | Temp 98.6°F | Resp 16 | Ht 61.0 in | Wt 230.0 lb

## 2014-06-20 DIAGNOSIS — M150 Primary generalized (osteo)arthritis: Secondary | ICD-10-CM

## 2014-06-20 DIAGNOSIS — F418 Other specified anxiety disorders: Secondary | ICD-10-CM | POA: Diagnosis not present

## 2014-06-20 DIAGNOSIS — E1151 Type 2 diabetes mellitus with diabetic peripheral angiopathy without gangrene: Secondary | ICD-10-CM

## 2014-06-20 DIAGNOSIS — F411 Generalized anxiety disorder: Secondary | ICD-10-CM

## 2014-06-20 DIAGNOSIS — M159 Polyosteoarthritis, unspecified: Secondary | ICD-10-CM

## 2014-06-20 DIAGNOSIS — E1159 Type 2 diabetes mellitus with other circulatory complications: Secondary | ICD-10-CM

## 2014-06-20 MED ORDER — HYDROCODONE-ACETAMINOPHEN 10-325 MG PO TABS: 1.0000 | ORAL_TABLET | ORAL | Status: DC | PRN

## 2014-06-20 NOTE — Patient Instructions (Signed)
We will schedule you in with a psychiatrist in the system that is more affordable. I am not doing a good job managing your severe anxiety and feel that I may be doing you more harm than good. I feel that I need the help of the trained mental health provider to give you the best possible care.   Come back for your wellness visit later this year.

## 2014-06-20 NOTE — Telephone Encounter (Signed)
Patient called stating her blood sugars have been an issue  She would like to speak with nurse regarding her being off the metformin   Please advise patient    Thank you

## 2014-06-20 NOTE — Progress Notes (Signed)
Pre visit review using our clinic review tool, if applicable. No additional management support is needed unless otherwise documented below in the visit note. 

## 2014-06-20 NOTE — Telephone Encounter (Signed)
Lvm to return call

## 2014-06-22 NOTE — Progress Notes (Signed)
   Subjective:    Patient ID: Kristen [name], female    DOB: 01-23-1946, 69 y.o.   MRN: [id number]  HPI The patient is a 69 YO female who is coming in to follow up on her diabetes (poorly controlled, no complications, on glipizide and victoza and metformin), anxiety (poorly controlled, asked her to see mental health which she did not, on xanax 1 mg TID), and her osteoarthritis (doing poorly, on chronic opioids and not well controlled, s/p several surgeries on her joints, needs knee replacements). No new complaints. Tried to cut back on the xanax to 1/2 pill several times but had several panic attacks at an eye appointment and other appointment. Did not see mental health although I did ask her to since I am not comfortable with her current regimen.   Review of Systems  Constitutional: Negative for activity change, appetite change and unexpected weight change.  HENT: Negative.   Respiratory: Negative for cough, chest tightness and shortness of breath.   Cardiovascular: Negative for chest pain, palpitations and leg swelling.  Gastrointestinal: Negative for abdominal pain, diarrhea, constipation and abdominal distention.  Musculoskeletal: Positive for back pain and arthralgias. Negative for myalgias and gait problem.  Skin: Negative.   Hematological: Does not bruise/bleed easily.  Psychiatric/Behavioral: Positive for sleep disturbance. Negative for suicidal ideas and self-injury. The patient is nervous/anxious.       Objective:   Physical Exam  Constitutional: She is oriented to person, place, and time. She appears well-developed and well-nourished.  HENT:  Head: Atraumatic.  Eyes: EOM are normal. Pupils are equal, round, and reactive to light.  Neck: Normal range of motion.  Cardiovascular: Normal rate and regular rhythm.   Pulmonary/Chest: Effort normal and breath sounds normal.  Abdominal: Soft. Bowel sounds are normal.  Neurological: She is alert and oriented to person, place, and  time. Coordination normal.   Filed Vitals:   06/20/14 1429  BP: 122/80  Pulse: 88  Temp: 98.6 F (37 C)  TempSrc: Oral  Resp: 16  Height: 5\' 1"  (1.549 m)  Weight: 230 lb (104.327 kg)  SpO2: 98%      Assessment & Plan:

## 2014-06-22 NOTE — Assessment & Plan Note (Signed)
She does get high amount of opiates per month and has been on for some time. She does get 150 norco 10/325 mg per month. Refilled today with no refills. She is in need of knee replacement. She is also having shoulder pain in her left shoulder. Talked to her about the weight which is likely a contributor to her pain and the fact that adequate pain control is meant to let her exercise and work on weight loss and that she needs to work on exercise.

## 2014-06-22 NOTE — Assessment & Plan Note (Addendum)
Does follow with endocrinology and not due for HgA1c today. She is having side effects from metformin and does not feel able to take. Did talk to her about reducing frequency and dosing instead of stopping since she needs better control of her diabetes or she risks heart attack and/or stroke from her poor control as well as kidney dysfunction. Did want to talk to her about the fact that high sugars can cause overall malaise and could be a contributor to how she is feeling. She needs to get her diet under control and spent time talking to her about appropriate choices for food.

## 2014-06-22 NOTE — Assessment & Plan Note (Signed)
Did ask her to see mental health back in December as I felt she had severe anxiety with some complicated depression and she has not been willing or able to tolerate any SSRIs that we have tried (she stopped both trials after less than 2 weeks). She did not go see mental health. Have let her know that I am not able to continue providing xanax 1 mg TID without appropriate mental care. She still has 1 refill on her medication and then will not be refilled. She has severe disease (tried going to 1/2 and had panic attack) and needs appropriate care. This is a high risk medication and despite the fact that she has been on it 20 years with her previous PCP does not make it safer (as she tries to tell me every visit). She is also on a high dose and quantity of opiates which makes it even more high risk. Referral again placed to psychiatry today to help her get the help she needs. This is holding back other areas of her health (and I suspect it makes her pain more severe and keeps her needing more opiates and from exercising).

## 2014-06-23 ENCOUNTER — Encounter: Payer: Self-pay | Admitting: Internal Medicine

## 2014-06-24 ENCOUNTER — Telehealth: Payer: Self-pay | Admitting: Internal Medicine

## 2014-06-24 MED ORDER — GLUCOSE BLOOD VI STRP: ORAL_STRIP | Status: DC

## 2014-06-24 NOTE — Telephone Encounter (Signed)
Rx sent per MD's ok.  

## 2014-06-24 NOTE — Telephone Encounter (Signed)
Patient need prescription for test strips, one touch ultra, she test 8 x a day she stated that she really it today.

## 2014-06-24 NOTE — Telephone Encounter (Signed)
See note below and please advise if ok to send in rx stating pt is checking 8 times per day. Medication list states the pt is to check tid.  Thanks!

## 2014-06-24 NOTE — Telephone Encounter (Signed)
Patient called stating she needs an auth for her test strips to test 8 times daily   Thank you

## 2014-06-24 NOTE — Telephone Encounter (Signed)
OK to send like that. She has a h/o hypoglycemia.

## 2014-06-25 ENCOUNTER — Other Ambulatory Visit: Payer: Self-pay | Admitting: *Deleted

## 2014-06-25 NOTE — Telephone Encounter (Signed)
Please read message below. Should we change the amount of times pt needs to test? Please advise.

## 2014-06-25 NOTE — Telephone Encounter (Signed)
Yes, she had hypoglycemia episodes, OK to check 8x a day.

## 2014-06-25 NOTE — Telephone Encounter (Signed)
PA completed.

## 2014-06-25 NOTE — Telephone Encounter (Signed)
Opened encounter in error  

## 2014-06-28 ENCOUNTER — Other Ambulatory Visit: Payer: Self-pay | Admitting: Internal Medicine

## 2014-07-01 ENCOUNTER — Ambulatory Visit: Payer: Medicare Other

## 2014-07-01 ENCOUNTER — Ambulatory Visit: Payer: Self-pay | Admitting: Internal Medicine

## 2014-07-02 DIAGNOSIS — M1812 Unilateral primary osteoarthritis of first carpometacarpal joint, left hand: Secondary | ICD-10-CM | POA: Diagnosis not present

## 2014-07-02 DIAGNOSIS — L03031 Cellulitis of right toe: Secondary | ICD-10-CM | POA: Diagnosis not present

## 2014-07-02 DIAGNOSIS — M1811 Unilateral primary osteoarthritis of first carpometacarpal joint, right hand: Secondary | ICD-10-CM | POA: Diagnosis not present

## 2014-07-04 ENCOUNTER — Encounter: Payer: Self-pay | Admitting: Internal Medicine

## 2014-07-07 MED ORDER — ALPRAZOLAM 1 MG PO TABS: 1.0000 mg | ORAL_TABLET | Freq: Three times a day (TID) | ORAL | Status: DC | PRN

## 2014-07-07 NOTE — Addendum Note (Signed)
Addended by: Vertell Novak A on: 07/07/2014 08:46 AM   Modules accepted: Orders

## 2014-07-08 ENCOUNTER — Other Ambulatory Visit: Payer: Self-pay | Admitting: Internal Medicine

## 2014-07-14 ENCOUNTER — Telehealth: Payer: Self-pay | Admitting: Internal Medicine

## 2014-07-14 NOTE — Telephone Encounter (Signed)
Patient would like for you to call her, she haven't been feeling well, please advise

## 2014-07-14 NOTE — Telephone Encounter (Signed)
Returned pt's call. Pt stated that she saw Dr Doug Sou and she did not do any blood work. Pt needs a CMP.  Dr Doug Sou advised her that she can do have a CMP done when she sees Dr Cruzita Lederer. Please advise if this is ok.

## 2014-07-14 NOTE — Telephone Encounter (Signed)
Yes, absolutely.

## 2014-07-15 ENCOUNTER — Ambulatory Visit (INDEPENDENT_AMBULATORY_CARE_PROVIDER_SITE_OTHER): Payer: Medicare Other | Admitting: General Practice

## 2014-07-15 ENCOUNTER — Encounter: Payer: Self-pay | Admitting: Internal Medicine

## 2014-07-15 ENCOUNTER — Other Ambulatory Visit: Payer: Self-pay | Admitting: *Deleted

## 2014-07-15 ENCOUNTER — Ambulatory Visit: Payer: Medicare Other

## 2014-07-15 ENCOUNTER — Telehealth: Payer: Self-pay | Admitting: Cardiovascular Disease

## 2014-07-15 ENCOUNTER — Other Ambulatory Visit: Payer: Medicare Other

## 2014-07-15 ENCOUNTER — Other Ambulatory Visit (INDEPENDENT_AMBULATORY_CARE_PROVIDER_SITE_OTHER): Payer: Medicare Other

## 2014-07-15 DIAGNOSIS — E1151 Type 2 diabetes mellitus with diabetic peripheral angiopathy without gangrene: Secondary | ICD-10-CM

## 2014-07-15 DIAGNOSIS — E1159 Type 2 diabetes mellitus with other circulatory complications: Secondary | ICD-10-CM

## 2014-07-15 DIAGNOSIS — Z5181 Encounter for therapeutic drug level monitoring: Secondary | ICD-10-CM | POA: Diagnosis not present

## 2014-07-15 DIAGNOSIS — I4891 Unspecified atrial fibrillation: Secondary | ICD-10-CM

## 2014-07-15 LAB — COMPREHENSIVE METABOLIC PANEL
ALT: 14 U/L (ref 0–35)
AST: 14 U/L (ref 0–37)
Albumin: 4.2 g/dL (ref 3.5–5.2)
Alkaline Phosphatase: 59 U/L (ref 39–117)
BILIRUBIN TOTAL: 0.7 mg/dL (ref 0.2–1.2)
BUN: 14 mg/dL (ref 6–23)
CO2: 26 meq/L (ref 19–32)
Calcium: 9.5 mg/dL (ref 8.4–10.5)
Chloride: 104 mEq/L (ref 96–112)
Creatinine, Ser: 0.83 mg/dL (ref 0.40–1.20)
GFR: 88.31 mL/min (ref >60.00)
Glucose, Bld: 127 mg/dL — ABNORMAL HIGH (ref 70–99)
Potassium: 4.3 mEq/L (ref 3.5–5.1)
Sodium: 138 mEq/L (ref 135–145)
Total Protein: 7.6 g/dL (ref 6.0–8.3)

## 2014-07-15 LAB — MICROALBUMIN / CREATININE URINE RATIO
CREATININE, U: 153.8 mg/dL
Microalb Creat Ratio: 4.2 mg/g (ref 0.0–30.0)
Microalb, Ur: 6.5 mg/dL — ABNORMAL HIGH (ref 0.0–1.9)

## 2014-07-15 LAB — POCT INR: INR: 2.3

## 2014-07-15 NOTE — Telephone Encounter (Signed)
Called pt and she wanted labs done today. Labs put in and pt to go to the [place] office for lab work.

## 2014-07-15 NOTE — Telephone Encounter (Signed)
I spoke with the pt and she complained of not feeling well on Sunday. The pt noticed a pain in her upper abdomen and left arm that she described as a bubble moving. She also felt lightheaded. The pt has changed her diet and developed diarrhea and GI upset.  The pt was concerned that her potassium was low and this was checked today and it was 4.3. The pt has lost 10 lbs and her insulin has been on hold for 4 weeks. She states that her glucose has been dropping into the 50's. I advised the pt that her symptoms could be related to GI upset and bloating (gas bubble) and fluctuations in her glucose.  I also asked the pt about her SOB and she states this is the same as it has always been. I instructed the pt to call our office if she had any other questions or concerns.

## 2014-07-15 NOTE — Progress Notes (Signed)
Pre visit review using our clinic review tool, if applicable. No additional management support is needed unless otherwise documented below in the visit note. 

## 2014-07-15 NOTE — Telephone Encounter (Signed)
New message    Patient calling    Monday evening had episode that last for  10-15 min. Have problem with her left arm on yesterday.     Pt C/O Shortness Of Breath: STAT if SOB developed within the last 24 hours or pt is noticeably SOB on the phone  1. Are you currently SOB (can you hear that pt is SOB on the phone)? No   2. How long have you been experiencing SOB? Having for a long time   3. Are you SOB when sitting or when up moving around? Having for a long time    4. Are you currently experiencing any other symptoms? lightheadedness over the weekend. Sunday did not feel well.

## 2014-07-15 NOTE — Progress Notes (Signed)
Agree with plan 

## 2014-07-17 ENCOUNTER — Ambulatory Visit (INDEPENDENT_AMBULATORY_CARE_PROVIDER_SITE_OTHER): Payer: Medicare Other | Admitting: Family Medicine

## 2014-07-17 ENCOUNTER — Encounter: Payer: Self-pay | Admitting: Family Medicine

## 2014-07-17 VITALS — BP 128/84 | HR 89 | Wt 226.4 lb

## 2014-07-17 DIAGNOSIS — Z8673 Personal history of transient ischemic attack (TIA), and cerebral infarction without residual deficits: Secondary | ICD-10-CM

## 2014-07-17 DIAGNOSIS — M150 Primary generalized (osteo)arthritis: Secondary | ICD-10-CM

## 2014-07-17 DIAGNOSIS — Z7901 Long term (current) use of anticoagulants: Secondary | ICD-10-CM | POA: Diagnosis not present

## 2014-07-17 DIAGNOSIS — M797 Fibromyalgia: Secondary | ICD-10-CM | POA: Diagnosis not present

## 2014-07-17 DIAGNOSIS — I4891 Unspecified atrial fibrillation: Secondary | ICD-10-CM | POA: Diagnosis not present

## 2014-07-17 DIAGNOSIS — M159 Polyosteoarthritis, unspecified: Secondary | ICD-10-CM

## 2014-07-17 MED ORDER — HYDROCODONE-ACETAMINOPHEN 10-325 MG PO TABS: 1.0000 | ORAL_TABLET | ORAL | Status: DC | PRN

## 2014-07-17 NOTE — Progress Notes (Signed)
   Subjective:    Patient ID: Kristen [name], female    DOB: May 04, 1945, 69 y.o.   MRN: [id number]  HPI She is here for a get acquainted visit. She is interested in switching to my practice. She has a very long and complicated history. She states she has a history of panic attacks and presently is on medications listed in the chart. In the past she apparently has taken Lexapro, Cymbalta, but seem a and Paxil. Presently she is scheduled to see Dr. Casimiro Needle  for follow-up concerning her care. She also has chronic pain from fibromyalgia, arthritis etc. In the past she had seen Dr. Hardin Negus but apparently her previous physicians have been providing her with Norco. She states she has had left shoulder pain and needs both knees replaced.He also has an underlying history of diabetes and is seeing an endocrinologist for this. She also has a history of atrial fibrillation and is on chronic Coumadin therapy. Also previous history of CVA.   Review of Systems     Objective:   Physical Exam Alert and in no distress otherwise not examined       Assessment & Plan:  Primary osteoarthritis involving multiple joints  Long term current use of anticoagulant  H/O: CVA (cerebrovascular accident)  Fibromyalgia  Atrial fibrillation, unspecified I encouraged her to follow-up with Dr. Jeanella Craze.She will continue to be followed by her cardiologist, orthopedic surgeon. I told her that I was not comfortable providing her pain meds and we'll set her up to see Dr. Hardin Negus again. I will give her a one-month supply of Norco but clearly explained that I did not want to continue to do this. I also explained my concerns concerning the use of her Xanax however will defer this to Dr. Darnelle Spangle. She will return here in several months. Approximately half hour spent in counseling and doing her record.

## 2014-07-23 ENCOUNTER — Telehealth: Payer: Self-pay | Admitting: Family Medicine

## 2014-07-23 NOTE — Telephone Encounter (Signed)
Referral form faxed to [facility] ph [phone]  Fax [phone]

## 2014-07-24 ENCOUNTER — Encounter: Payer: Self-pay | Admitting: Internal Medicine

## 2014-07-24 ENCOUNTER — Ambulatory Visit (INDEPENDENT_AMBULATORY_CARE_PROVIDER_SITE_OTHER): Payer: Medicare Other | Admitting: Internal Medicine

## 2014-07-24 VITALS — BP 122/68 | HR 97 | Temp 98.2°F | Resp 12 | Wt 226.0 lb

## 2014-07-24 DIAGNOSIS — E1159 Type 2 diabetes mellitus with other circulatory complications: Secondary | ICD-10-CM

## 2014-07-24 DIAGNOSIS — E1151 Type 2 diabetes mellitus with diabetic peripheral angiopathy without gangrene: Secondary | ICD-10-CM

## 2014-07-24 NOTE — Patient Instructions (Addendum)
Please continue: - Victoza 1.8 mg in am - Metformin XR 1000 mg 2x a day  Please stay off Lantus!  Please decrease the Glipizide to 2.5 mg and only take it before large dinners.   Please stop at the lab.  Please come back for a follow-up appointment in 3 months.

## 2014-07-24 NOTE — Progress Notes (Signed)
Patient ID: Kristen [name], female   DOB: 1945-10-08, 69 y.o.   MRN: [id number]  HPI: Kristen [name] is a 69 y.o.-year-old female, returning for DM2, dx 2008, insulin-dependent, uncontrolled, with complications (CAD, PAD, h/o CVD, peripheral neuropathy). Last visit 2 mo ago.  Last hemoglobin A1c was: Lab Results  Component Value Date   HGBA1C 9.0* 04/11/2014   HGBA1C 7.0* 09/27/2013   HGBA1C 8.7* 05/03/2013  She had a steroid inj last month.  Pt is on a regimen of: - Glipizide 5 mg before dinner - not all day - Victoza 1.8 mg - added 03/2013 - Metformin XR 1000 mg bid - restarted 04/2014 She came off Lantus 45 units hs >> after she started to change her diet (no bread, portion control) We tried NPH at one point. She was on Levemir pen 30 u nits 2x a day - gets the med directly from [facility] - but had to stop b/c of price. She is now Metformin XR 1000 mg bid b/c N/D and palpitations - retried with the same results She was on Bydureon in the past, then also on Novolog 60-70-75.  She tried Januvia before.   Pt checks her sugars 2-3x a day (brings a great log) >> they are great! - am: 90-100 >> 150-170 >> 70-120 (135) >> 90-120 >> 115-210 >> 89, 112-147, 187 >> 90-126 - 2h after b'fast: 116 >> 96-140 >> upper 200s >> 135-165, 202 >> 95-155, 161 - before lunch: 82-125 >> 79 >> n/c >> 100-140 >> 103-199 >> 180-200 >> 120-148, 194 >> 93-135 - 2h after lunch: 111-204 >> 110-169 - before dinner: 99-168 >> n/c >> 80-120 >> 93-116 >> 187-210 >> 101-177, 214 >> 93-147, 175 - after dinner: 120-188 >> 200-upper 200s >> 108, 136-180, 230 >> 89-170, 192 - bedtime: 90-100 >> n/c >> 130s >> see above >> 112-168, 208 >> 69-168 - nighttime: 56, 71-145 No recent lows;  she has hypoglycemia awareness at 100. Highest: 300s - steroid.   Pt does not have chronic kidney disease, last BUN/creatinine was:  Lab Results  Component Value Date   BUN 14 07/15/2014   CREATININE 0.83 07/15/2014   On Telmisartan. Last set of lipids: Lab Results  Component Value Date   CHOL 119 12/07/2012   HDL 36* 12/07/2012   LDLCALC 60 12/07/2012   LDLDIRECT 116.0 11/28/2008   TRIG 115 12/07/2012   CHOLHDL 3.3 12/07/2012  On Lipitor. Pt's last eye exam was in 2015. + hypertensive retinopathy.  Denies numbness and tingling in her legs.  She also has a history of hypertension, paroxysmal A. Fib-on anticoagulation, She had a stress test on 11/14/2012 b/c palpitations and tachycardia (on Holter monitor) >> cath on 11/30/2012, depression, osteoarthritis - status post arthroscopic knee surgeries x2, status post lumbar laminectomy x3, hyperlipidemia, morbid obesity, chronic lymphadenitis, GERD, hypothyroidism, headaches, fibromyalgia.  ROS: Constitutional: no fatigue, + weight loss, + hot flashes, no nocturia, + poor sleep Eyes: + blurry vision, no xerophthalmia ENT: no sore throat, no nodules palpated in throat, no dysphagia/odynophagia, no hoarseness Cardiovascular: + CP/no palpitations/+ SOB/no leg swelling Respiratory: no cough/+ SOB with activity  Gastrointestinal: no vomiting/+ diarrhea/no constipation Musculoskeletal: + muscle aches/+ joint aches Skin: no rashes, + itching Neurological: no tremors/numbness/tingling/dizziness, no headaches  I reviewed pt's medications, allergies, PMH, social hx, family hx, and changes were documented in the history of present illness. Otherwise, unchanged from my initial visit note.   PE: BP 122/68 mmHg  Pulse 97  Temp(Src) 98.2 F (36.8  C) (Oral)  Resp 12  Wt 226 lb (102.513 kg)  SpO2 95% Body mass index is 42.72 kg/(m^2). Wt Readings from Last 3 Encounters:  07/24/14 226 lb (102.513 kg)  07/17/14 226 lb 6.4 oz (102.694 kg)  06/20/14 230 lb (104.327 kg)   Constitutional: overweight, in NAD, full supraclavicular fat pads Eyes: PERRLA, EOMI, no exophthalmos ENT: moist mucous membranes, no thyromegaly, no cervical lymphadenopathy Cardiovascular:  RRR, No MRG Respiratory: CTA B Gastrointestinal: abdomen soft, NT, ND, BS+ Musculoskeletal: no deformities, strength intact in all 4 Skin: moist, warm, acanthosis nigricans  ASSESSMENT: 1. DM2, insulin-dependent, uncontrolled, with complications - CAD- Echo 12/2010: EF 50-55%, status post CABG 2004: LIMA to LAD - cards: Dr. Lia Foyer. Also has a LBBB.  - PAD - h/o stroke - peripheral neuropathy - on neurontin  PLAN:  1. Patient with a several-year history of diabetes, previously on basal-bolus regimen, now off all insulin with great control! She has low sugars after taking glipizide at night >> will reduce the dose to 1/2 tablet, and only with a large meal.  - I advised her to Patient Instructions  Please continue: - Victoza 1.8 mg in am - Metformin XR 1000 mg 2x a day  Please stay off Lantus!  Please decrease the Glipizide to 2.5 mg and only take it before large dinners.   Please stop at the lab.   Please come back for a follow-up appointment in 3 months  - check HbA1C today - up to date with eye exams - given more sugar logs - RTC in 2 mo  Office Visit on 07/24/2014  Component Date Value Ref Range Status  . Hgb A1c MFr Bld 07/24/2014 7.1* 4.6 - 6.5 % Final   Glycemic Control Guidelines for People with Diabetes:Non Diabetic:  <6%Goal of Therapy: <7%Additional Action Suggested:  >8%    Great HbA1c!

## 2014-07-25 LAB — HEMOGLOBIN A1C: HEMOGLOBIN A1C: 7.1 % — AB (ref 4.6–6.5)

## 2014-08-03 ENCOUNTER — Other Ambulatory Visit: Payer: Self-pay | Admitting: Internal Medicine

## 2014-08-06 ENCOUNTER — Encounter: Payer: Self-pay | Admitting: Family Medicine

## 2014-08-06 ENCOUNTER — Other Ambulatory Visit: Payer: Self-pay | Admitting: Family Medicine

## 2014-08-06 ENCOUNTER — Telehealth: Payer: Self-pay | Admitting: Family Medicine

## 2014-08-06 NOTE — Telephone Encounter (Signed)
Pt called for refill of lasix. This was filled be her former pcp, you have not filled before. Please send to [facility] on [address].

## 2014-08-07 ENCOUNTER — Other Ambulatory Visit: Payer: Self-pay | Admitting: Family Medicine

## 2014-08-07 ENCOUNTER — Telehealth: Payer: Self-pay | Admitting: Internal Medicine

## 2014-08-07 MED ORDER — HYDROCODONE-ACETAMINOPHEN 10-325 MG PO TABS: 1.0000 | ORAL_TABLET | ORAL | Status: DC | PRN

## 2014-08-07 MED ORDER — FUROSEMIDE 20 MG PO TABS
20.0000 mg | ORAL_TABLET | Freq: Every day | ORAL | Status: DC
Start: 2014-08-07 — End: 2015-03-17

## 2014-08-07 NOTE — Telephone Encounter (Signed)
Patient states she is switching PCP's to an outside PCP.  She would like to speak with you in regards.

## 2014-08-10 ENCOUNTER — Other Ambulatory Visit: Payer: Self-pay | Admitting: Internal Medicine

## 2014-08-11 ENCOUNTER — Telehealth: Payer: Self-pay | Admitting: General Practice

## 2014-08-11 ENCOUNTER — Ambulatory Visit (INDEPENDENT_AMBULATORY_CARE_PROVIDER_SITE_OTHER): Payer: Medicare Other | Admitting: General Practice

## 2014-08-11 DIAGNOSIS — Z5181 Encounter for therapeutic drug level monitoring: Secondary | ICD-10-CM

## 2014-08-11 NOTE — Telephone Encounter (Signed)
Spoke with patient.  Patient is changing PCP's outside of [place].

## 2014-08-12 ENCOUNTER — Ambulatory Visit: Payer: Self-pay | Admitting: General Practice

## 2014-08-15 DIAGNOSIS — L602 Onychogryphosis: Secondary | ICD-10-CM | POA: Diagnosis not present

## 2014-08-15 DIAGNOSIS — E1151 Type 2 diabetes mellitus with diabetic peripheral angiopathy without gangrene: Secondary | ICD-10-CM | POA: Diagnosis not present

## 2014-08-15 DIAGNOSIS — L84 Corns and callosities: Secondary | ICD-10-CM | POA: Diagnosis not present

## 2014-08-15 NOTE — Telephone Encounter (Signed)
Call to the patient to discuss AWV; prior to seeing Dr. Doug Sou next week. The patient stated that [facility] told her to have an AWV with her "doctor" and that she was not very happy with Dr. Doug Sou and will find another PCP. The patient stated [employer] told her to schedule a visit, but she chooses to find another doctor and will cancel the visit with Dr. Doug Sou on 6/16 at 1pm.

## 2014-08-21 ENCOUNTER — Encounter: Payer: Medicare Other | Admitting: Internal Medicine

## 2014-08-22 ENCOUNTER — Encounter: Payer: Self-pay | Admitting: Cardiovascular Disease

## 2014-08-22 ENCOUNTER — Other Ambulatory Visit: Payer: Self-pay | Admitting: Nurse Practitioner

## 2014-08-22 MED ORDER — DILTIAZEM HCL ER COATED BEADS 120 MG PO CP24: 120.0000 mg | ORAL_CAPSULE | Freq: Every day | ORAL | Status: DC

## 2014-08-22 NOTE — Telephone Encounter (Signed)
Called patient in response to an email that was received this morning.  Patient c/o woke up this morning at 0600 with a funny feeling, took BP and heart rate with automatic cuff and got reading of 135/68, HR 106 bpm.  She states she noticed heart rate felt irregular, has hx atrial fib but has not been in that rhythm in so long it didn't occur to her at first that she could be in a fib.  States rechecked pulse with pulse oximeter and got 130 bpm.  She states she noticed that she felt like she was going to pass out when she stood; also noticed SOB.  States she felt like she was having an anxiety attack so she called EMS and paramedics came and did a rhythm strip and advised her she was in atrial fib but that while they still had her hooked up, she returned to NSR.  They advised that she did not have to go to the hospital so she stayed at home.  States HR was at 115 bpm at rest when paramedics left.  Complains of headache x 3 days, took a pain pill to ease pain this morning.  She reports compliance with all medications; states last INR was 2.3 on 5/10 and next check scheduled for 6/24 with Dr. Lanice Shirts office.  States she currently does not feel like her heart is pounding but if she gets up to do anything, she feels SOB.  States resting pulse currently is 91 bpm and feels regular. She takes Atenolol 100 mg daily for rate control.  I advised her that Dr. Burt Knack is not in the office today but that I will review her concerns with our FLEX APP and call her back with her advice.  Patient verbalized understanding and agreement.

## 2014-08-22 NOTE — Telephone Encounter (Signed)
Truitt Merle, NP advised patient start Cardizem 120 mg once daily and be seen by Melina Copa, PA on Friday 6/24.  Patient verbalized understanding and agreement with plan of care.  Rx sent to patient's pharmacy per request and appointment scheduled.  Patient is aware of appointment date/time and provider.

## 2014-08-25 ENCOUNTER — Telehealth: Payer: Self-pay | Admitting: *Deleted

## 2014-08-25 NOTE — Telephone Encounter (Signed)
Patient has appointment on 10/24/2014, recheck a1c then

## 2014-08-26 ENCOUNTER — Telehealth: Payer: Self-pay | Admitting: Cardiovascular Disease

## 2014-08-26 NOTE — Telephone Encounter (Signed)
I spoke with the pt and yesterday her pulse was 100.  She states that the [place] XT makes her feel "yucky" and she does not want to take this medicine anymore. I asked her to describe "yucky" and she said tired, sluggish and does notice a few  palpitations after taking this medicine. I asked her to try taking this medication at bedtime to see if she tolerates the medicine.  The pt is scheduled to be seen in our office 08/29/14 and would also like to discuss having a sleep study performed.  The pt will contact the office with any other questions or concerns.

## 2014-08-26 NOTE — Telephone Encounter (Signed)
°  New message    Patient calling     Pt c/o medication issue:  1. Name of Medication: Cartia 120 mg   2. How are you currently taking this medication (dosage and times per day)? Once a day taken about 4:30 pm   3. Are you having a reaction (difficulty breathing--STAT)? Does not feeling, a little pal, little sob.    4. What is your medication issue? Will be holding medication until she hear from nurse.

## 2014-08-27 ENCOUNTER — Ambulatory Visit: Payer: Medicare Other

## 2014-08-27 ENCOUNTER — Ambulatory Visit (INDEPENDENT_AMBULATORY_CARE_PROVIDER_SITE_OTHER): Payer: Medicare Other | Admitting: Psychiatry

## 2014-08-27 ENCOUNTER — Encounter: Payer: Self-pay | Admitting: Physician Assistant

## 2014-08-27 VITALS — BP 128/76 | HR 87 | Ht 61.0 in | Wt 223.8 lb

## 2014-08-27 DIAGNOSIS — I519 Heart disease, unspecified: Secondary | ICD-10-CM | POA: Insufficient documentation

## 2014-08-27 DIAGNOSIS — F419 Anxiety disorder, unspecified: Secondary | ICD-10-CM

## 2014-08-27 DIAGNOSIS — F4323 Adjustment disorder with mixed anxiety and depressed mood: Secondary | ICD-10-CM

## 2014-08-27 MED ORDER — ALPRAZOLAM 1 MG PO TABS: 1.0000 mg | ORAL_TABLET | Freq: Three times a day (TID) | ORAL | Status: DC | PRN

## 2014-08-27 NOTE — Progress Notes (Signed)
Cardiology Office Note Date:  08/29/2014  Patient ID:  Prim, Morace 30-Oct-1945, MRN [id number] PCP:  Wyatt Haste, MD  Cardiologist:  Burt Knack  Chief Complaint: fatigue  History of Present Illness: Kristen [name] is a 69 y.o. female with history of CAD s/p CABG 2004 (stable cath 2014 demonstrating stable CAD and continued patency of her LIMA graft), paroxysmal atrial fibrillation, history of stroke, morbid obesity, diabetes, hypertension, left bundle branch block, panic and axiety. Last echo 2012 with mild LVH, EF 50-55, grade 1 DD. Last EF was 45% in 12/2012 by cath. She recently called the office because she felt like Cartia XT was making her feel sluggish. She presents to the office to discuss these concerns.   She says that last Friday she had an episode where her heart felt funny. She felt somewhat faint. BP was 140/85 and HR was in the 100-130 range. She also says she has a history of anxiety and had a full panic attack which didn't make things better at that time. She called EMS. They found her to be in atrial fib with HR 115. She apparently converted to NSR while they were there and did not go to the hospital. Did not report CP or SOB. She called in and was started on Diltiazem 120mg  daily. Unfortunately since starting this medicine she has felt more tired and also has noticed increased palpitations. She is also wondering about the utility of an updated sleep study. She does report daytime fatigue. She has not had any bleeding, fever, chills or any other new symptoms lately.    Past Medical History  Diagnosis Date  . CAD (coronary artery disease)     a. s/p CABG 2004.  Marland Kitchen PAF (paroxysmal atrial fibrillation)   . Hypertension   . Hyperlipidemia   . Hypokalemia   . Diabetes mellitus   . Depression   . Lumbar back pain   . LBBB (left bundle branch block)   . Headache(784.0)   . Lymphadenitis   . Arthritis   . Bursitis   . Chronic anticoagulation     on  coumadin  . Heart palpitations   . Diastolic dysfunction     per echo in October 2012 with EF 50 to 55%  . 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 GERD (gastroesophageal reflux disease) 10/23/2003  . Fibromyalgia   . Blood transfusion without reported diagnosis   . LV dysfunction     a. EF 45% by cath 2014.  . Morbid obesity     Past Surgical History  Procedure Laterality Date  . Angioplasty  laminectomy  . Lumbar laminectomy      x3  . Coronary artery bypass graft      LIMA to LAD   . Abdominal hysterectomy    . Cholecystectomy    . Tonsillectomy    . Knee arthroscopy    . Sphincterotomy    . Left heart catheterization with coronary/graft angiogram  12/07/2012    Procedure: LEFT HEART CATHETERIZATION WITH Beatrix Fetters;  Surgeon: Blane Ohara, MD;  Location: [facility] CATH LAB;  Service: Cardiovascular;;    Current Outpatient Prescriptions  Medication Sig Dispense Refill  . ALPRAZolam (XANAX) 1 MG tablet Take 1 tablet (1 mg total) by mouth 3 (three) times daily as needed for anxiety. 90 tablet 3  . atenolol (TENORMIN) 100 MG tablet Take 1 tablet (100mg ) by mouth daily. 45 tablet 2  . atorvastatin (LIPITOR) 40 MG tablet Take 1 tablet (  40 mg total) by mouth daily. 90 tablet 3  . clotrimazole-betamethasone (LOTRISONE) cream Apply 1 application topically daily as needed (for itching).    . fluocinonide cream (LIDEX) 4.09 % Apply 1 application topically as needed (For itching).     . furosemide (LASIX) 20 MG tablet Take 1 tablet (20 mg total) by mouth daily. 90 tablet 1  . gabapentin (NEURONTIN) 300 MG capsule Take 1 capsule (300 mg total) by mouth 3 (three) times daily. 90 capsule 3  . glipiZIDE (GLUCOTROL) 5 MG tablet TAKE ONE TABLET BY MOUTH DAILY BEFORE DINNER AS DIRECTED 90 tablet 1  . GLUCOPHAGE XR 500 MG 24 hr tablet Take 2 tablets (1,000 mg total) by mouth 2 (two) times daily with a meal. 120 tablet 2  . glucose blood (ONE TOUCH ULTRA TEST) test strip Use to test  blood sugar 8 times daily. Dx: E11.59. 250 each 5  . HYDROcodone-acetaminophen (NORCO) 10-325 MG per tablet Take 1 tablet by mouth every 4 (four) hours as needed (pain). 150 tablet 0  . hydrocortisone 2.5 % ointment   3  . Liraglutide (VICTOZA) 18 MG/3ML SOPN Inject 1.8 mg into the skin daily.    . nitroGLYCERIN (NITROSTAT) 0.4 MG SL tablet Place 1 tablet (0.4 mg total) under the tongue every 5 (five) minutes as needed. For chest pain 30 tablet 3  . potassium chloride SA (K-DUR,KLOR-CON) 20 MEQ tablet TAKE 1 TABLET BY MOUTH TWICE DAILY 60 tablet 6  . spironolactone (ALDACTONE) 25 MG tablet TAKE 1 TABLET BY MOUTH TWICE DAILY 60 tablet 5  . telmisartan (MICARDIS) 40 MG tablet Take 1 tablet (40 mg total) by mouth daily. 30 tablet 5  . warfarin (COUMADIN) 6 MG tablet TAKE AS DIRECTED BY ANTICOAGULATION CLINIC 30 tablet 0  . insulin glargine (LANTUS) 100 UNIT/ML injection Inject 45 Units into the skin at bedtime.     + Diltiazem 120mg  daily started the other day No current facility-administered medications for this visit.    Allergies:   Metformin and related; Cleocin; Codeine; Doxycycline hyclate; Macrolides and ketolides; Morphine; Pentazocine lactate; Vibramycin; and Sulfonamide derivatives   Social History:  The patient  reports that she quit smoking about 13 years ago. She has never used smokeless tobacco. She reports that she does not drink alcohol or use illicit drugs.   Family History:  The patient's family history includes Arthritis in an other family member; Colon cancer in her paternal uncle; Diabetes in an other family member; Heart attack in her father; Heart disease in her father; Hypertension in an other family member; Kidney disease in an other family member; Stroke in her mother and another family member.  ROS:  Please see the history of present illness. She has intermittent RUE edema since the days she has been seeing Dr. Lia Foyer, with intermittent abdominal bloating. This is not  present by exam today. All other systems are reviewed and otherwise negative.   PHYSICAL EXAM:  VS:  BP 110/70 mmHg  Pulse 84  Ht 5\' 1"  (1.549 m)  Wt 224 lb 1.9 oz (101.66 kg)  BMI 42.37 kg/m2 BMI: Body mass index is 42.37 kg/(m^2). Well nourished, well developed obese F, in no acute distress HEENT: normocephalic, atraumatic Neck: no JVD or masses Cardiac:  normal S1, S2; RRR; no murmurs, rubs, or gallops Lungs:  clear to auscultation bilaterally, no wheezing, rhonchi or rales, breathing unlabored Abd: soft, nontender, no hepatomegaly, + BS MS: no deformity or atrophy Ext: no edema Skin: warm and dry, no rash  Neuro:  moves all extremities spontaneously, no focal abnormalities noted, follows commands Psych: euthymic mood, full affect  EKG:  Done today shows NSR 84bpm, LBBB.   Recent Labs: 09/27/2013: TSH 0.45 07/15/2014: ALT 14; BUN 14; Creatinine, Ser 0.83; Potassium 4.3; Sodium 138  No results found for requested labs within last 365 days.   CrCl cannot be calculated (Patient has no serum creatinine result on file.).   Wt Readings from Last 3 Encounters:  08/29/14 224 lb 1.9 oz (101.66 kg)  08/27/14 223 lb 12.8 oz (101.515 kg)  07/24/14 226 lb (102.513 kg)     Other studies reviewed: Additional studies/records reviewed today include: summarized above  ASSESSMENT AND PLAN:  1. Paroxysmal atrial fibrillation - last Friday she had the first episode of AF she's had in a long time. She had done well on atenolol 100mg  daily for a while before this event. Unfortunately she has not tolerated the new initiation of diltiazem due to fatigue and increased palpitations. Will stop diltiazem and increase atenolol to 100mg  QAM/50mg  QPM as she reports she tends to have slightly higher HR in the evening (was on 100mg  daily). Will undertake general workup with labs and sleep study to make sure the fatigue isn't actually the cause of another underlying factor. I have asked [place] to notify our office  if she gets no relief. 2. Fatigue - will check labs (BMET, TSH, CBC) to exclude an obvious physiologic cause. Agree with need for sleep study. Will refer. 3. CAD s/p CABG 2004 - continue BB, statin. She is not on ASA since she is on Coumadin. No reported ischemic-type symptoms. 4. Morbid obesity - as above, will proceed with sleep study given risk factors for OSA. 5. LV dysfunction - will reassess with echo since it will drive therapy for AF if EF remains down. If EF is low, CCB may not be the best choice.  Disposition: F/u with Dr. Burt Knack or APP in 6-8 weeks.  Current medicines are reviewed at length with the patient today.  The patient did not have any concerns regarding medicines.  Raechel Ache PA-C 08/29/2014 8:50 AM     [facility] [place] [address] [phone] (office)  [phone] (fax)

## 2014-08-27 NOTE — Progress Notes (Signed)
[facility] MD Progress Note  08/27/2014 3:25 PM Kristen [name]  MRN:  [id number] Subjective: Anxious Principal Problem: Xanax prescriptions This patient is a 69 year old married mother who is retired from the [name]. The patient now is doing well. In 1991 her primary care doctor, Dr. Benay [name] began him her on Xanax 3 mg a day. During that time she was having some emotional distress but mainly was related to poorly controlled blood pressure. This is the context of the fact that the patient knows that she has an extensive family history of stroke and heart attack and was very worried about her blood pressure. Dr. Arnoldo [name] was so concerned that she was so anxious that he began her on Xanax. She did very well on it and after a certain peer time he chose to slowly attempt to reduce it but had great problems. Over the years she's tried a number of times to come off of Xanax even with attempts of transferring at the [name]. All of these attempts apparently were unsuccessful. At some point Dr. Arnoldo [name] just continue the Xanax but got a second opinion from Dr. Pollie [name] about its use. According to the patient Dr. Caprice [name] agreed with ongoing and continual use of Xanax. This evaluation by Dr. Caprice [name] was in 2006. The patient is been taking Xanax without problems. She has no excessive anxiety. Dr. Arnoldo [name] now has retired and her new primary care physician from the beginning was very hesitant to prescribe this medicine. Her primary care doctor therefore insisted that she get another psychiatric opinion and consider possibly coming off the Xanax. In a very close evaluation this patient is doing very well. She is never misused Xanax. She does not drink alcohol or use any drugs. At this time the patient denies daily depression. She is sleeping and eating well. She's got good energy. She concentrates well has a good sense of worth and is never been suicidal. She enjoys jazz music television and buying  close. The patient denies the use of alcohol. She's never used any drugs. The patient is never been psychotic. The patient denies symptoms of the past of major depression or mania. Specifically she denies any symptoms consistent with generalized anxiety disorder panic disorder or excessive compulsive disorder. The patient has significant medical illnesses. This includes hypertension, atrial fibrillation coronary artery disease and status post bypass surgery in 2004. She also sleep apnea fibromyalgia and has had lumbar surgery. The patient denies ever being in a psychiatric hospital. In the past she did see Dr. Violeta [name] for supportive psychotherapy and was seen by Dr. Caprice [name] once or twice. The patient is never been on any other psychiatric medicine other than a short trial on [name] for unclear reasons. The patient notes that in 1991 with Dr. Arnoldo [name] started her on Xanax she was actually resistant and hesitant. He actually had a talk with her about using it. The patient acknowledges that since she's been on Xanax when she takes irregularly which she always does she actually is felt stable. At this time for a number reasons the patient has chosen to enter care with her previous primary care doctor. She now has a new doctor who actually is willing to prescribe Xanax if I wouldn't. Patient Active Problem List   Diagnosis Date Noted  . Encounter for therapeutic drug monitoring [Z51.81] 05/03/2013  . Angina decubitus [I20.8] 12/07/2012  . DM (diabetes mellitus), type 2 with peripheral vascular complications [G99.24] 26/83/4196  . PVC's/nonsustained VT [I49.3] 10/24/2012  . Morbid obesity  with BMI of 40.0-44.9, adult [E66.01, Z68.41] 03/15/2012  . H/O: CVA (cerebrovascular accident) [Z86.73] 02/09/2012  . Chest pain, musculoskeletal [R07.89] 07/20/2011  . Hypothyroidism [E03.9] 05/27/2011  . Fibromyalgia [M79.7] 10/08/2010  . Long term current use of anticoagulant [Z79.01] 05/05/2010  . CERVICAL  RADICULOPATHY, LEFT [M54.12] 10/30/2009  . Complicated grief [U13.24] 04/24/2009  . BACK PAIN, LUMBAR, WITH RADICULOPATHY [IMO0002] 05/12/2008  . LBBB [I44.7] 03/19/2008  . Chronic lymphadenitis [I88.1] 07/23/2007  . Depression with anxiety [F41.8] 01/29/2007  . Osteoarthritis [M19.90] 11/22/2006  . BURSITIS, ACROMIOCLAVICULAR, LEFT [M75.50] 10/03/2006  . HYPERLIPIDEMIA NEC/NOS [E78.5] 09/13/2006  . Coronary atherosclerosis [I25.10] 09/13/2006  . Atrial fibrillation [I48.91] 09/13/2006  . GERD [K21.9] 09/13/2006  . Essential hypertension [I10] 08/30/2006   Total Time spent with patient: 1 hour   Past Medical History:  Past Medical History  Diagnosis Date  . CAD (coronary artery disease)   . PAF (paroxysmal atrial fibrillation)   . Hypertension   . Hyperlipidemia   . Hypokalemia   . Diabetes mellitus   . Depression   . Lumbar back pain   . LBBB (left bundle branch block)   . Headache(784.0)   . Lymphadenitis   . Arthritis   . Bursitis   . Chronic anticoagulation     on coumadin  . Heart palpitations   . Diastolic dysfunction     per echo in October 2012 with EF 50 to 55%  . 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 GERD (gastroesophageal reflux disease) 10/23/2003  . Fibromyalgia   . Blood transfusion without reported diagnosis     Past Surgical History  Procedure Laterality Date  . Angioplasty  laminectomy  . Lumbar laminectomy      x3  . Coronary artery bypass graft      LIMA to LAD   . Abdominal hysterectomy    . Cholecystectomy    . Tonsillectomy    . Knee arthroscopy    . Sphincterotomy    . Left heart catheterization with coronary/graft angiogram  12/07/2012    Procedure: LEFT HEART CATHETERIZATION WITH Beatrix Fetters;  Surgeon: Blane Ohara, MD;  Location: [facility] CATH LAB;  Service: Cardiovascular;;   Family History:  Family History  Problem Relation Age of Onset  . Heart attack Father   . Heart disease Father   . Kidney disease    .  Stroke    . Arthritis    . Hypertension    . Diabetes    . Stroke Mother   . Colon cancer Paternal Uncle    Social History:  History  Alcohol Use No     History  Drug Use No    History   Social History  . Marital Status: Legally Separated    Spouse Name: N/A  . Number of Children: N/A  . Years of Education: N/A   Social History Main Topics  . Smoking status: Former Smoker    Quit date: 03/07/2001  . Smokeless tobacco: Never Used  . Alcohol Use: No  . Drug Use: No  . Sexual Activity: Not on file   Other Topics Concern  . Not on file   Social History Narrative   Lives locally, has help available if needed.   Additional History:    Sleep: Good  Appetite:  Good   Assessment:   Musculoskeletal: Strength & Muscle Tone: within normal limits Gait & Station: normal Patient leans: Right   Psychiatric Specialty Exam: Physical Exam  ROS  Blood pressure  128/76, pulse 87, height 5\' 1"  (1.549 m), weight 223 lb 12.8 oz (101.515 kg).Body mass index is 42.31 kg/(m^2).  General Appearance: Casual  Eye Contact::  Good  Speech:  Clear and Coherent  Volume:  Normal  Mood:  Euthymic  Affect:  Congruent  Thought Process:  Coherent  Orientation:  Full (Time, Place, and Person)  Thought Content:  WDL  Suicidal Thoughts:  No  Homicidal Thoughts:  No  Memory:  NA  Judgement:  NA  Insight:  Good  Psychomotor Activity:  Normal  Concentration:  Good  Recall:  Good  Fund of Knowledge:Good  Language: NA  Akathisia:  No  Handed:  Right  AIMS (if indicated):     Assets:  Desire for Improvement  ADL's:  Intact  Cognition: WNL  Sleep:        Current Medications: Current Outpatient Prescriptions  Medication Sig Dispense Refill  . ALPRAZolam (XANAX) 1 MG tablet Take 1 tablet (1 mg total) by mouth 3 (three) times daily as needed for anxiety. 90 tablet 3  . amitriptyline (ELAVIL) 10 MG tablet TAKE 1 TABLET BY MOUTH EVERY DAY AT BEDTIME 30 tablet 0  . atenolol  (TENORMIN) 100 MG tablet TAKE ONE TABLET BY MOUTH EVERY DAY 90 tablet 1  . atorvastatin (LIPITOR) 40 MG tablet Take 1 tablet (40 mg total) by mouth daily. 90 tablet 3  . CARTIA XT 120 MG 24 hr capsule TAKE 1 CAPSULE BY MOUTH DAILY 90 capsule 0  . clotrimazole-betamethasone (LOTRISONE) cream Apply 1 application topically daily as needed (for itching).    . fluocinonide cream (LIDEX) 0.25 % Apply 1 application topically as needed (For itching).     . furosemide (LASIX) 20 MG tablet Take 1 tablet (20 mg total) by mouth daily. 90 tablet 1  . gabapentin (NEURONTIN) 300 MG capsule Take 1 capsule (300 mg total) by mouth 3 (three) times daily. 90 capsule 3  . glipiZIDE (GLUCOTROL) 5 MG tablet TAKE ONE TABLET BY MOUTH DAILY BEFORE DINNER AS DIRECTED 90 tablet 1  . GLUCOPHAGE XR 500 MG 24 hr tablet Take 2 tablets (1,000 mg total) by mouth 2 (two) times daily with a meal. 120 tablet 2  . glucose blood (ONE TOUCH ULTRA TEST) test strip Use to test blood sugar 8 times daily. Dx: E11.59. 250 each 5  . HYDROcodone-acetaminophen (NORCO) 10-325 MG per tablet Take 1 tablet by mouth every 4 (four) hours as needed (pain). 150 tablet 0  . hydrocortisone 2.5 % ointment   3  . insulin glargine (LANTUS) 100 UNIT/ML injection Inject 45 Units into the skin at bedtime.    . Liraglutide (VICTOZA) 18 MG/3ML SOPN Inject 1.8 mg into the skin daily.    . nitroGLYCERIN (NITROSTAT) 0.4 MG SL tablet Place 1 tablet (0.4 mg total) under the tongue every 5 (five) minutes as needed. For chest pain 30 tablet 3  . potassium chloride SA (K-DUR,KLOR-CON) 20 MEQ tablet TAKE 1 TABLET BY MOUTH TWICE DAILY 60 tablet 6  . spironolactone (ALDACTONE) 25 MG tablet TAKE 1 TABLET BY MOUTH TWICE DAILY 60 tablet 5  . telmisartan (MICARDIS) 40 MG tablet Take 1 tablet (40 mg total) by mouth daily. 30 tablet 5  . warfarin (COUMADIN) 6 MG tablet TAKE AS DIRECTED BY ANTICOAGULATION CLINIC 30 tablet 0   No current facility-administered medications for this  visit.    Lab Results: No results found. However, due to the size of the patient record, not all encounters were searched. Please check Results  Review for a complete set of results.  Physical Findings: AIMS:  , ,  ,  ,    CIWA:    COWS:     Treatment Plan Summary: At this point in my opinion this patient is physically dependent upon Xanax. Given the fact that she 51 and has multiple medical problems in my opinion I would not take her off her Xanax. The only way I consider to do that as if she was completely wishing to do that. At this time multiple attempts have been made and she's tried to come off of it with little success. Xanax has no long-term proven ill effects. This patient shows no evidence of abusing it or misusing her Xanax. When she takes her anxiety is controlled and when she tries come off of that she has intense anxiety. I believe there are more problems and efforts to get her off Xanax. This patient is agreed to enter into my care and to allow me to prescribe her Xanax which I be glad to do. We discussed the possibility that over the next year or 2 we could rediscuss the process of May be coming off of it and again trying to switch it to Klonopin and then coming off Klonopin. I do not think this is absolutely necessary at all. She is doing very well and I have no problem with continual long-term use of Xanax. It should be noted that she did have 2 falls but they were completely unrelated to Xanax use. There is simply missed calculations and a slippery floor. She had no problems with balance she was not intoxicated she was not lethargic or cognitively impaired because of Xanax. I think this patient can safely take her Xanax. This patient to return to see me in 3 months. So her #1 problem was to clarify her diagnosis which I suspect is adjustment disorder with anxious mood state which I think at this time is probably resolved. I think it is perfectly reasonable to continue long-term Xanax and  that would be our plan. Today we spent well over 50% of the time educating her about the terms of addiction misuse of Xanax and wide is been a concern about it. At this time we will continue her Xanax 1 mg 3 times a day. At this time the patient denies any chest pain or shortness of breath. She denies any neurological symptoms at all. She has multiple specialists including cardiologist her following her closely. Her other significant problem may be sleep apnea as she is having trouble accessing a CPAP machine. We recommended that she speak to her primary care doctor about this.   Medical Decision Making:  Self-Limited or Minor (1)     Tawnie Ehresman IRVING 08/27/2014, 3:25 PM

## 2014-08-29 ENCOUNTER — Ambulatory Visit (INDEPENDENT_AMBULATORY_CARE_PROVIDER_SITE_OTHER): Payer: Medicare Other | Admitting: Family Medicine

## 2014-08-29 ENCOUNTER — Other Ambulatory Visit: Payer: Self-pay | Admitting: Physician Assistant

## 2014-08-29 ENCOUNTER — Encounter: Payer: Self-pay | Admitting: Physician Assistant

## 2014-08-29 ENCOUNTER — Ambulatory Visit (INDEPENDENT_AMBULATORY_CARE_PROVIDER_SITE_OTHER): Payer: Medicare Other | Admitting: Physician Assistant

## 2014-08-29 ENCOUNTER — Other Ambulatory Visit: Payer: Self-pay

## 2014-08-29 VITALS — BP 110/70 | HR 84 | Ht 61.0 in | Wt 224.1 lb

## 2014-08-29 DIAGNOSIS — I480 Paroxysmal atrial fibrillation: Secondary | ICD-10-CM

## 2014-08-29 DIAGNOSIS — R5383 Other fatigue: Secondary | ICD-10-CM | POA: Diagnosis not present

## 2014-08-29 DIAGNOSIS — I2510 Atherosclerotic heart disease of native coronary artery without angina pectoris: Secondary | ICD-10-CM | POA: Diagnosis not present

## 2014-08-29 DIAGNOSIS — Z7901 Long term (current) use of anticoagulants: Secondary | ICD-10-CM | POA: Diagnosis not present

## 2014-08-29 DIAGNOSIS — R5382 Chronic fatigue, unspecified: Secondary | ICD-10-CM

## 2014-08-29 DIAGNOSIS — I519 Heart disease, unspecified: Secondary | ICD-10-CM

## 2014-08-29 DIAGNOSIS — R899 Unspecified abnormal finding in specimens from other organs, systems and tissues: Secondary | ICD-10-CM

## 2014-08-29 LAB — BASIC METABOLIC PANEL
BUN: 17 mg/dL (ref 6–23)
CO2: 25 mEq/L (ref 19–32)
Calcium: 10 mg/dL (ref 8.4–10.5)
Chloride: 102 mEq/L (ref 96–112)
Creatinine, Ser: 0.8 mg/dL (ref 0.40–1.20)
GFR: 92.1 mL/min (ref >60.00)
GLUCOSE: 172 mg/dL — AB (ref 70–99)
Potassium: 4.2 mEq/L (ref 3.5–5.1)
SODIUM: 136 meq/L (ref 135–145)

## 2014-08-29 LAB — CBC
HEMATOCRIT: 41.8 % (ref 36.0–46.0)
HEMOGLOBIN: 13.7 g/dL (ref 12.0–15.0)
MCHC: 32.7 g/dL (ref 30.0–36.0)
MCV: 93.7 fl (ref 78.0–100.0)
PLATELETS: 300 10*3/uL (ref 150.0–400.0)
RBC: 4.46 Mil/uL (ref 3.87–5.11)
RDW: 14.3 % (ref 11.5–15.5)
WBC: 6.9 10*3/uL (ref 4.0–10.5)

## 2014-08-29 LAB — TSH: TSH: 3.67 u[IU]/mL (ref 0.35–4.50)

## 2014-08-29 LAB — PROTIME-INR
INR: 2.85 — AB (ref <1.50)
PROTHROMBIN TIME: 30.2 s — AB (ref 11.6–15.2)

## 2014-08-29 LAB — MAGNESIUM: Magnesium: 1.5 mg/dL (ref 1.5–2.5)

## 2014-08-29 MED ORDER — ATENOLOL 100 MG PO TABS: ORAL_TABLET | ORAL | Status: DC

## 2014-08-29 MED ORDER — MAGNESIUM OXIDE 400 MG PO CAPS: 400.0000 mg | ORAL_CAPSULE | Freq: Every day | ORAL | Status: DC

## 2014-08-29 NOTE — Progress Notes (Signed)
   Subjective:    Patient ID: Kristen [name], female    DOB: [birth year], 69 y.o.   MRN: [id number]  HPI    Review of Systems     Objective:   Physical Exam        Assessment & Plan:  PT/INR 2.85. TSH is within normal range. Recheck PT/INR in one month

## 2014-08-29 NOTE — Patient Instructions (Addendum)
Medication Instructions:   STOP TAKING DILTAZEM (CARTIA)  START TAKING ATENOLOL 100 MG A TABLET AND HALF ( 100 MG IN AM AND 50 MG  IN PM)   Labwork:  BMET MAG CBC TSH   Testing/Procedures:  Your physician has requested that you have an echocardiogram. Echocardiography is a painless test that uses sound waves to create images of your heart. It provides your doctor with information about the size and shape of your heart and how well your heart's chambers and valves are working. This procedure takes approximately one hour. There are no restrictions for this procedure.  Your physician has recommended that you have a sleep study. This test records several body functions during sleep, including: brain activity, eye movement, oxygen and carbon dioxide blood levels, heart rate and rhythm, breathing rate and rhythm, the flow of air through your mouth and nose, snoring, body muscle movements, and chest and belly movement.  Follow-Up:  IN 6 TO 8 WEEKS WITH AN AVAILABLE APP OR WITH DR Burt Knack (MESSAGE NURSE LAUREN)   Any Other Special Instructions Will Be Listed Below (If Applicable).

## 2014-08-29 NOTE — Patient Instructions (Addendum)
Continue on present medication regimen. Recheck here in one month.Thyroid function also normal.

## 2014-09-02 ENCOUNTER — Other Ambulatory Visit: Payer: Self-pay

## 2014-09-02 ENCOUNTER — Encounter (HOSPITAL_COMMUNITY): Payer: Self-pay

## 2014-09-02 ENCOUNTER — Other Ambulatory Visit (HOSPITAL_COMMUNITY): Payer: Self-pay

## 2014-09-02 ENCOUNTER — Ambulatory Visit (HOSPITAL_COMMUNITY): Payer: Medicare Other | Attending: Internal Medicine

## 2014-09-02 DIAGNOSIS — Z951 Presence of aortocoronary bypass graft: Secondary | ICD-10-CM | POA: Insufficient documentation

## 2014-09-02 DIAGNOSIS — E119 Type 2 diabetes mellitus without complications: Secondary | ICD-10-CM | POA: Insufficient documentation

## 2014-09-02 DIAGNOSIS — E785 Hyperlipidemia, unspecified: Secondary | ICD-10-CM | POA: Diagnosis not present

## 2014-09-02 DIAGNOSIS — I480 Paroxysmal atrial fibrillation: Secondary | ICD-10-CM | POA: Insufficient documentation

## 2014-09-02 DIAGNOSIS — I10 Essential (primary) hypertension: Secondary | ICD-10-CM | POA: Insufficient documentation

## 2014-09-02 DIAGNOSIS — I2510 Atherosclerotic heart disease of native coronary artery without angina pectoris: Secondary | ICD-10-CM | POA: Insufficient documentation

## 2014-09-02 DIAGNOSIS — I447 Left bundle-branch block, unspecified: Secondary | ICD-10-CM | POA: Diagnosis not present

## 2014-09-02 DIAGNOSIS — Z8673 Personal history of transient ischemic attack (TIA), and cerebral infarction without residual deficits: Secondary | ICD-10-CM | POA: Insufficient documentation

## 2014-09-02 MED ORDER — PERFLUTREN LIPID MICROSPHERE
5.0000 mL | Freq: Once | INTRAVENOUS | Status: AC
  Administered 2014-09-02: 5 mL via INTRAVENOUS

## 2014-09-02 NOTE — Progress Notes (Signed)
The patient complained of warm sensation in chest while injecting Definity that only lasted a few seconds. The patient was monitored x 30 minutes post definity with no further symptoms. Patient asymptomatic at discharge. BP 110/80, HR 82, O2 Sat 97%RA. Irven Baltimore, RN

## 2014-09-02 NOTE — Progress Notes (Signed)
Kristen [name] complained of warm sensation in chest after Definity given that only lasted only a few seconds. The patient was monitored 30 minutes with no further symptoms. Patient was discharged asymptomatic. BP 110/80, HR 82 O2 Sat 97%RA. Irven Baltimore, RN

## 2014-09-03 ENCOUNTER — Other Ambulatory Visit: Payer: Self-pay | Admitting: Internal Medicine

## 2014-09-03 NOTE — Telephone Encounter (Signed)
Are we still filling for this patient?

## 2014-09-09 ENCOUNTER — Other Ambulatory Visit (INDEPENDENT_AMBULATORY_CARE_PROVIDER_SITE_OTHER): Payer: Medicare Other | Admitting: *Deleted

## 2014-09-09 DIAGNOSIS — R899 Unspecified abnormal finding in specimens from other organs, systems and tissues: Secondary | ICD-10-CM | POA: Diagnosis not present

## 2014-09-09 LAB — MAGNESIUM: MAGNESIUM: 1.6 mg/dL (ref 1.5–2.5)

## 2014-09-10 ENCOUNTER — Telehealth: Payer: Self-pay

## 2014-09-10 MED ORDER — MAGNESIUM OXIDE 400 MG PO CAPS: 400.0000 mg | ORAL_CAPSULE | Freq: Two times a day (BID) | ORAL | Status: DC

## 2014-09-10 NOTE — Telephone Encounter (Signed)
Notes Recorded by Charlie Pitter, PA-C on 09/09/2014 at 4:17 PM Magnesium level is marginally better - increase MagOx to 400mg  twice a day. As we instructed last week, make sure to eat foods rich in magnesium as well - yogurt, black beans, nuts, seeds, avocado, bananas. Thank you. Dayna Dunn PA-C

## 2014-09-15 ENCOUNTER — Other Ambulatory Visit: Payer: Self-pay | Admitting: Internal Medicine

## 2014-09-16 ENCOUNTER — Encounter: Payer: Self-pay | Admitting: Family Medicine

## 2014-09-16 ENCOUNTER — Other Ambulatory Visit: Payer: Self-pay | Admitting: Family Medicine

## 2014-09-16 MED ORDER — HYDROCODONE-ACETAMINOPHEN 10-325 MG PO TABS: 1.0000 | ORAL_TABLET | ORAL | Status: DC | PRN

## 2014-09-22 ENCOUNTER — Ambulatory Visit (INDEPENDENT_AMBULATORY_CARE_PROVIDER_SITE_OTHER): Payer: Medicare Other | Admitting: Family Medicine

## 2014-09-22 ENCOUNTER — Encounter: Payer: Self-pay | Admitting: Family Medicine

## 2014-09-22 VITALS — BP 122/80 | Wt 226.0 lb

## 2014-09-22 DIAGNOSIS — R51 Headache: Secondary | ICD-10-CM | POA: Diagnosis not present

## 2014-09-22 DIAGNOSIS — R519 Headache, unspecified: Secondary | ICD-10-CM

## 2014-09-22 DIAGNOSIS — M5412 Radiculopathy, cervical region: Secondary | ICD-10-CM

## 2014-09-22 DIAGNOSIS — I10 Essential (primary) hypertension: Secondary | ICD-10-CM | POA: Diagnosis not present

## 2014-09-22 DIAGNOSIS — I480 Paroxysmal atrial fibrillation: Secondary | ICD-10-CM | POA: Diagnosis not present

## 2014-09-22 NOTE — Patient Instructions (Signed)
Use Tylenol earlier in the headache to try and block it from occurring

## 2014-09-22 NOTE — Progress Notes (Signed)
   Subjective:    Patient ID: Kristen [name], female    DOB: 02-11-1946, 69 y.o.   MRN: [id number]  HPI  she is here for evaluation of multiple issues. She has had a headache for the last 2-3 weeks. It has been intermittent in nature and she can have 2 or 3 of these in one day. It is bitemporal in nature and/or and does cause occasional dizziness. No blurred vision, double vision, nausea or vomiting. She usually takes one Tylenol with this she also takes Tylenol with codeine every 2 hours to help decrease the nausea. She also complains of rhinorrhea but no sneezing, itchy watery eyes. She does have a history of cervical disc disease and wonders if this plays a role in it. She also has a history of atrial fibrillation and recently had a cardiac meds readjusted.   Review of Systems     Objective:   Physical Exam Alert and in no distress.  No tenderness palpation over the frontal sinuses or over the bitemporal area. No occipital palpable pain.Tympanic membranes and canals are normal. Pharyngeal area is normal. Neck is supple without adenopathy or thyromegaly. Cardiac exam shows a regular sinus rhythm without murmurs or gallops. Lungs are clear to auscultation.        Assessment & Plan:  Essential hypertension  Paroxysmal atrial fibrillation  Headache around the eyes  Brachial neuritis or radiculitis  he will continue on her present medication regimen. Did recommend she use Tylenol earlier in the process to try and break the headache pattern. She is already on codeine and did not feel that it would be appropriate at another pain medicine other than Tylenol to her regimen. Over 20 minutes that , greater than 50% in coordination of care and counseling. This is a very difficult and complicated patient.

## 2014-09-26 ENCOUNTER — Other Ambulatory Visit: Payer: Medicare Other

## 2014-09-26 DIAGNOSIS — Z7901 Long term (current) use of anticoagulants: Secondary | ICD-10-CM | POA: Diagnosis not present

## 2014-09-26 LAB — PROTIME-INR
INR: 2.57 — AB (ref <1.50)
Prothrombin Time: 27.8 seconds — ABNORMAL HIGH (ref 11.6–15.2)

## 2014-10-08 ENCOUNTER — Other Ambulatory Visit: Payer: Self-pay | Admitting: Geriatric Medicine

## 2014-10-15 DIAGNOSIS — G894 Chronic pain syndrome: Secondary | ICD-10-CM | POA: Diagnosis not present

## 2014-10-15 DIAGNOSIS — M150 Primary generalized (osteo)arthritis: Secondary | ICD-10-CM | POA: Diagnosis not present

## 2014-10-15 DIAGNOSIS — Z79891 Long term (current) use of opiate analgesic: Secondary | ICD-10-CM | POA: Diagnosis not present

## 2014-10-15 DIAGNOSIS — M75102 Unspecified rotator cuff tear or rupture of left shoulder, not specified as traumatic: Secondary | ICD-10-CM | POA: Diagnosis not present

## 2014-10-21 ENCOUNTER — Other Ambulatory Visit: Payer: Self-pay | Admitting: Family Medicine

## 2014-10-22 ENCOUNTER — Other Ambulatory Visit: Payer: Self-pay | Admitting: Family Medicine

## 2014-10-24 ENCOUNTER — Ambulatory Visit: Payer: Medicare Other | Admitting: Internal Medicine

## 2014-10-28 ENCOUNTER — Telehealth: Payer: Self-pay | Admitting: Cardiovascular Disease

## 2014-10-28 NOTE — Telephone Encounter (Signed)
New message      Talk to Lauren-----what can she take/do for constipation

## 2014-10-28 NOTE — Telephone Encounter (Signed)
I spoke with the pt and made her aware that she can try warm prune juice and increased dietary fiber. The pt would like to take an OTC medication for constipation.  I advised that she can try Colace or Biscodyl as needed.

## 2014-11-03 ENCOUNTER — Telehealth: Payer: Self-pay | Admitting: Internal Medicine

## 2014-11-03 NOTE — Telephone Encounter (Signed)
Called pt and advised her per Dr Arman Filter message below. Pt stated she is ok and did not have any SE.

## 2014-11-03 NOTE — Telephone Encounter (Signed)
Team health note dated 11/01/14 at 7:24 PM Chief Complaint OVERDOSE took too much medication at once Initial Comment Caller states she took too much medication. She took her morning dose x2. PreDisposition Home Care Nurse Assessment Nurse: Denyse Amass, RN, Benjamine Mola Date/Time Eilene Ghazi Time): 11/01/2014 7:24:53 PM Confirm and document reason for call. If symptomatic, describe symptoms. ---Patient states she took Victoza 1.8 mg twice today, she is suppose to take it once a day. States she took it about 10 minutes ago. States her blood sugar was 232 after eating a meal and she took the Victoza about an hour and a half after the meal. Has the patient traveled out of the country within the last 30 days? ---Not Applicable Does the patient require triage? ---Yes Related visit to physician within the last 2 weeks? ---No Does the PT have any chronic conditions? (i.e. diabetes, asthma, etc.) ---Yes List chronic conditions. ---Diabetic CABG A-Fib CAD S/P CVA HTN High cholesterol Fibromyalgia Degenerative Joint Disease Guidelines Guideline Title Affirmed Question Affirmed Notes Nurse Date/Time (Eastern Time) Poisoning Diabetes drug error or overdose (e.g., insulin or extra dose) [place], RN, Benjamine Mola 11/01/2014 7:31:17 PM Disp. Time Eilene Ghazi Time) Disposition Final User 11/01/2014 7:20:55 PM Send to Urgent Queue Chilton [place]ouse 11/01/2014 7:37:28 PM Call PCP Now Yes [place], RN, Junius Creamer PCP NOW: You need to discuss this with your doctor. I'll page him now. If you haven't heard from the on-call doctor within 30 minutes, call again. FOR INSULIN ERRORS: * Monitor your blood sugar. LOW BLOOD SUGAR (hypoglycemia) is defined as a blood glucose under 70 mg/dl (3.9 mmol/l). * Watch for symptoms of hypoglycemia (e.g., confusion, weakness, shakiness, sweating). LOW BLOOD SUGAR - TREATMENT - Eat some (10-15 gms) sugar NOW. Each of the following is equivalent to 10 gms of glucose: * Milk (1  cup; 240 ml) * Orange juice (1/2 cup; 120 ml) * Pre-packaged juice box (1 box) * Table sugar or honey (3 teaspoons; 15 ml) * Glucose tablets (3) CARE ADVICE given per Poisoning (Adult) guideline.

## 2014-11-03 NOTE — Telephone Encounter (Signed)
No pb from taking Victoza 2x. It should not drop her CBGs too low. Nausea is a possible SE.

## 2014-11-03 NOTE — Telephone Encounter (Signed)
Please read message below and advise.  

## 2014-11-11 ENCOUNTER — Ambulatory Visit: Payer: Medicare Other | Admitting: Family Medicine

## 2014-11-12 ENCOUNTER — Ambulatory Visit (INDEPENDENT_AMBULATORY_CARE_PROVIDER_SITE_OTHER): Payer: Medicare Other | Admitting: Family Medicine

## 2014-11-12 ENCOUNTER — Encounter: Payer: Self-pay | Admitting: Family Medicine

## 2014-11-12 VITALS — BP 120/72 | HR 93 | Wt 225.0 lb

## 2014-11-12 DIAGNOSIS — M159 Polyosteoarthritis, unspecified: Secondary | ICD-10-CM

## 2014-11-12 DIAGNOSIS — Z1159 Encounter for screening for other viral diseases: Secondary | ICD-10-CM | POA: Diagnosis not present

## 2014-11-12 DIAGNOSIS — Z23 Encounter for immunization: Secondary | ICD-10-CM

## 2014-11-12 DIAGNOSIS — M150 Primary generalized (osteo)arthritis: Secondary | ICD-10-CM | POA: Diagnosis not present

## 2014-11-12 DIAGNOSIS — M25561 Pain in right knee: Secondary | ICD-10-CM | POA: Diagnosis not present

## 2014-11-12 DIAGNOSIS — Z5181 Encounter for therapeutic drug level monitoring: Secondary | ICD-10-CM

## 2014-11-12 NOTE — Progress Notes (Signed)
   Subjective:    Patient ID: Kristen [name], female    DOB: 1945/12/14, 69 y.o.   MRN: [id number]  HPI She apparently fell July 21 describing an injury that she fell full word landing flat but then experiencing right knee pain. She initially pointed to the medial aspect of her knee and then laterally as well. She also then stated that she has been seen in the past by Dr. Alvan Dame and x-rays did show evidence of arthritis describing it as bone on bone. She continues on medications listed in the chart including codeine. She also would like hepatitis C screening. She did miss her last PT/INR and would like that done as well.   Review of Systems     Objective:   Physical Exam  Right knee exam shows no effusion. Good motion of the knee. Medial and lateral collateral ligaments are intact. Anterior drawer negative. She did expanse some discomfort when doing McMurray's testing but it was negative.      Assessment & Plan:  Right knee pain  Need for prophylactic vaccination and inoculation against influenza - Plan: Flu vaccine HIGH DOSE PF (Fluzone High dose)  Need for hepatitis C screening test - Plan: Hepatitis C Antibody, CANCELED: Hepatitis C Antibody  Encounter for therapeutic drug monitoring - Plan: Protime-INR, CANCELED: Protime-INR  Primary osteoarthritis involving multiple joints Recommend she follow-up with Dr. Alvan Dame since there is evidence according to her of arthritis. Discussed the possibility of her getting an injection for that.

## 2014-11-12 NOTE — Patient Instructions (Signed)
Check with Dr. Alvan Dame concerning your right knee pain.

## 2014-11-13 ENCOUNTER — Encounter: Payer: Self-pay | Admitting: Family Medicine

## 2014-11-13 DIAGNOSIS — M75102 Unspecified rotator cuff tear or rupture of left shoulder, not specified as traumatic: Secondary | ICD-10-CM | POA: Diagnosis not present

## 2014-11-13 DIAGNOSIS — Z79891 Long term (current) use of opiate analgesic: Secondary | ICD-10-CM | POA: Diagnosis not present

## 2014-11-13 DIAGNOSIS — G894 Chronic pain syndrome: Secondary | ICD-10-CM | POA: Diagnosis not present

## 2014-11-13 DIAGNOSIS — M150 Primary generalized (osteo)arthritis: Secondary | ICD-10-CM | POA: Diagnosis not present

## 2014-11-13 LAB — PROTIME-INR
INR: 2.6 — AB (ref <1.50)
Prothrombin Time: 28.2 seconds — ABNORMAL HIGH (ref 11.6–15.2)

## 2014-11-13 LAB — HEPATITIS C ANTIBODY: HCV Ab: NEGATIVE

## 2014-11-21 ENCOUNTER — Ambulatory Visit: Payer: Medicare Other | Admitting: Internal Medicine

## 2014-11-21 ENCOUNTER — Ambulatory Visit (HOSPITAL_BASED_OUTPATIENT_CLINIC_OR_DEPARTMENT_OTHER): Payer: Medicare Other | Attending: Physician Assistant

## 2014-11-21 VITALS — Ht 61.0 in | Wt 227.0 lb

## 2014-11-21 DIAGNOSIS — Z6841 Body Mass Index (BMI) 40.0 and over, adult: Secondary | ICD-10-CM | POA: Diagnosis not present

## 2014-11-21 DIAGNOSIS — E119 Type 2 diabetes mellitus without complications: Secondary | ICD-10-CM | POA: Diagnosis not present

## 2014-11-21 DIAGNOSIS — R0683 Snoring: Secondary | ICD-10-CM | POA: Insufficient documentation

## 2014-11-21 DIAGNOSIS — I10 Essential (primary) hypertension: Secondary | ICD-10-CM | POA: Diagnosis not present

## 2014-11-21 DIAGNOSIS — E669 Obesity, unspecified: Secondary | ICD-10-CM | POA: Insufficient documentation

## 2014-11-21 DIAGNOSIS — R5383 Other fatigue: Secondary | ICD-10-CM

## 2014-11-21 DIAGNOSIS — G4733 Obstructive sleep apnea (adult) (pediatric): Secondary | ICD-10-CM | POA: Diagnosis not present

## 2014-11-21 DIAGNOSIS — I110 Hypertensive heart disease with heart failure: Secondary | ICD-10-CM

## 2014-11-21 DIAGNOSIS — I480 Paroxysmal atrial fibrillation: Secondary | ICD-10-CM

## 2014-11-25 ENCOUNTER — Other Ambulatory Visit: Payer: Self-pay

## 2014-11-25 DIAGNOSIS — Z1231 Encounter for screening mammogram for malignant neoplasm of breast: Secondary | ICD-10-CM

## 2014-11-26 ENCOUNTER — Telehealth: Payer: Self-pay | Admitting: Cardiology

## 2014-11-26 DIAGNOSIS — R0683 Snoring: Secondary | ICD-10-CM | POA: Insufficient documentation

## 2014-11-26 NOTE — Sleep Study (Signed)
   Patient Name: Kristen [name], Kristen [name] MRN: [id number] Study Date: 11/21/2014 Gender: Female D.O.B: 05/28/1945 Age (years): 25 Referring Provider: Melina Copa Interpreting Physician: Fransico Him MD, ABSM RPSGT: Baxter Flattery  Height (inches): 61 Weight (lbs): 227 BMI: 43 Neck Size: 16.00  CLINICAL INFORMATION Sleep Study Type: NPSG  Indication for sleep study: Diabetes, Hypertension, Obesity, OSA, Snoring  Epworth Sleepiness Score: 4  SLEEP STUDY TECHNIQUE As per the AASM Manual for the Scoring of Sleep and Associated Events v2.3 (April 2016) with a hypopnea requiring 4% desaturations.  The channels recorded and monitored were frontal, central and occipital EEG, electrooculogram (EOG), submentalis EMG (chin), nasal and oral airflow, thoracic and abdominal wall motion, anterior tibialis EMG, snore microphone, electrocardiogram, and pulse oximetry.  MEDICATIONS Patient's medications include: Xanax, Tenormin, Lipitor, Lasix, Neurontin, GLipizide, Glucophage, Norco, Insulin, Victoza, Aldactone, potassium, Magnesium, Micardis, Coumadin. Medications self-administered by patient during sleep study : No sleep medicine administered.  SLEEP ARCHITECTURE The study was initiated at 10:20:19 PM and ended at 5:14:27 AM.  Sleep onset time was 25.6 minutes and the sleep efficiency was reduced at 68.5%. The total sleep time was 283.5 minutes.  Stage REM latency was prolonged at 175.0 minutes.  The patient spent 5.82% of the night in stage N1 sleep, 90.65% in stage N2 sleep, 0.00% in stage N3 and 3.53% in REM.  Alpha intrusion was absent.  Supine sleep was 95.24%.  RESPIRATORY PARAMETERS The overall apnea/hypopnea index (AHI) was 0.0 per hour. There were 0 total apneas, including 0 obstructive, 0 central and 0 mixed apneas. There were 0 hypopneas and 0 RERAs.  The AHI during Stage REM sleep was 0.0 per hour.  AHI while supine was 0.0 per hour.  The mean oxygen saturation was  94.15%. The minimum SpO2 during sleep was 89.00%.  Moderate snoring was noted during this study.  CARDIAC DATA The 2 lead EKG demonstrated sinus rhythm. The mean heart rate was 79.47 beats per minute. Other EKG findings include: intermittent interventricular conduction delay.  LEG MOVEMENT DATA The total PLMS were 0 with a resulting PLMS index of 0.00. Associated arousal with leg movement index was 0.0 .  IMPRESSIONS No significant obstructive sleep apnea occurred during this study (AHI = 0.0/h). No significant central sleep apnea occurred during this study (CAI = 0.0/h). Mild oxygen desaturation was noted during this study (Min O2 = 89.00%). The patient snored with Moderate snoring volume. EKG findings include PVCs. Clinically significant periodic limb movements did not occur during sleep. No significant associated arousals.  DIAGNOSIS Snoring  RECOMMENDATIONS Avoid alcohol, sedatives and other CNS depressants that may result in sleep apnea and disrupt normal sleep architecture. Sleep hygiene should be reviewed to assess factors that may improve sleep quality. Weight management and regular exercise should be initiated or continued if appropriate.  Sueanne Margarita Diplomate, American Board of Sleep Medicine  ELECTRONICALLY SIGNED ON:  11/26/2014, 7:26 PM [place] PH: [phone]   FX: [phone] [place]

## 2014-11-26 NOTE — Telephone Encounter (Signed)
Please let patient know that sleep study showed no significant sleep apnea.    

## 2014-11-27 ENCOUNTER — Ambulatory Visit (INDEPENDENT_AMBULATORY_CARE_PROVIDER_SITE_OTHER): Payer: Medicare Other | Admitting: Psychiatry

## 2014-11-27 DIAGNOSIS — F4322 Adjustment disorder with anxiety: Secondary | ICD-10-CM | POA: Diagnosis not present

## 2014-11-27 DIAGNOSIS — F4323 Adjustment disorder with mixed anxiety and depressed mood: Secondary | ICD-10-CM

## 2014-11-27 MED ORDER — ALPRAZOLAM 1 MG PO TABS: 1.0000 mg | ORAL_TABLET | Freq: Three times a day (TID) | ORAL | Status: DC | PRN

## 2014-11-27 NOTE — Progress Notes (Signed)
[facility] MD Progress Note  11/27/2014 2:02 PM Kristen [name]  MRN:  [id number] Subjective:  Miserable Principal Problem: Adjustment disorder with anxiety Diagnosis:  Adjustment disorder with anxiety Overall the patient is not doing well but mainly because she has bad knees. She did fall in her kitchen but not because she was oversedated her because she lost balance. She actually got trapped between a refrigerator door and her dishwasher door. Her husband helped her up she her knee saw her primary care doctor who said it was not really damaged. But is evident to the patient that she has 2 very bad knees and she's been told now number of times that she needs replacements. The patient is planning to do this in the next 1-2 months. The patient's care to do it because she is prone to blood clots. That right now she is on a blood thinner. Patient takes her Xanax just as prescribed. She takes 1 mg 3 times a day. She denies daily depression. She actually is sleeping and eating fairly well. She's doing well with her husband. Life overall is good except for she has bad knees and is getting to her emotionally. She feels very limited by this. This patient is very intelligent and very articulate. Cognitively she doing extremely well. She denies being suicidal. She denies any neurological symptoms. She denies any chest pain or shortness of breath. Overall she is pleased with the ID of continuing her Xanax at least through her surgeries. Possibility of reactive change process of changing to Klonopin or perhaps Tranxene and switch over and possibly coming off of benzodiazepine's is a possibility. But as stated in previous notes and as stated with this patient I do not think it's essential. This patient is doing very well and is not misuse her medications does not drink alcohol is very responsible. Patient Active Problem List   Diagnosis Date Noted  . Snoring [R06.83] 11/26/2014  . LV dysfunction [I51.9]   . Encounter  for therapeutic drug monitoring [Z51.81] 05/03/2013  . Angina decubitus [I20.8] 12/07/2012  . DM (diabetes mellitus), type 2 with peripheral vascular complications [W43.15] 40/10/6759  . PVC's/nonsustained VT [I49.3] 10/24/2012  . Morbid obesity with BMI of 40.0-44.9, adult [E66.01, Z68.41] 03/15/2012  . H/O: CVA (cerebrovascular accident) [Z86.73] 02/09/2012  . Chest pain, musculoskeletal [R07.89] 07/20/2011  . Hypothyroidism [E03.9] 05/27/2011  . Fibromyalgia [M79.7] 10/08/2010  . Long term current use of anticoagulant [Z79.01] 05/05/2010  . Brachial neuritis or radiculitis [M54.12] 10/30/2009  . Complicated grief [P50.93] 04/24/2009  . BACK PAIN, LUMBAR, WITH RADICULOPATHY [IMO0002] 05/12/2008  . LBBB [I44.7] 03/19/2008  . Chronic lymphadenitis [I88.1] 07/23/2007  . Depression with anxiety [F41.8] 01/29/2007  . Osteoarthritis [M19.90] 11/22/2006  . BURSITIS, ACROMIOCLAVICULAR, LEFT [M75.50] 10/03/2006  . HYPERLIPIDEMIA NEC/NOS [E78.5] 09/13/2006  . Coronary atherosclerosis [I25.10] 09/13/2006  . Paroxysmal atrial fibrillation [I48.0] 09/13/2006  . GERD [K21.9] 09/13/2006  . Essential hypertension [I10] 08/30/2006   Total Time spent with patient: 30 minutes   Past Medical History:  Past Medical History  Diagnosis Date  . CAD (coronary artery disease)     a. s/p CABG 2004.  Marland Kitchen PAF (paroxysmal atrial fibrillation)   . Hypertension   . Hyperlipidemia   . Hypokalemia   . Diabetes mellitus   . Depression   . Lumbar back pain   . LBBB (left bundle branch block)   . Headache(784.0)   . Lymphadenitis   . Arthritis   . Bursitis   . Chronic anticoagulation  on coumadin  . Heart palpitations   . Diastolic dysfunction     per echo in October 2012 with EF 50 to 55%  . 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 GERD (gastroesophageal reflux disease) 10/23/2003  . Fibromyalgia   . Blood transfusion without reported diagnosis   . LV dysfunction     a. EF 45% by cath 2014.  .  Morbid obesity     Past Surgical History  Procedure Laterality Date  . Angioplasty  laminectomy  . Lumbar laminectomy      x3  . Coronary artery bypass graft      LIMA to LAD   . Abdominal hysterectomy    . Cholecystectomy    . Tonsillectomy    . Knee arthroscopy    . Sphincterotomy    . Left heart catheterization with coronary/graft angiogram  12/07/2012    Procedure: LEFT HEART CATHETERIZATION WITH Beatrix Fetters;  Surgeon: Blane Ohara, MD;  Location: [facility] CATH LAB;  Service: Cardiovascular;;   Family History:  Family History  Problem Relation Age of Onset  . Heart attack Father   . Heart disease Father   . Kidney disease    . Stroke    . Arthritis    . Hypertension    . Diabetes    . Stroke Mother   . Colon cancer Paternal Uncle    Social History:  History  Alcohol Use No     History  Drug Use No    Social History   Social History  . Marital Status: Legally Separated    Spouse Name: N/A  . Number of Children: N/A  . Years of Education: N/A   Social History Main Topics  . Smoking status: Former Smoker    Quit date: 03/07/2001  . Smokeless tobacco: Never Used  . Alcohol Use: No  . Drug Use: No  . Sexual Activity: Not on file   Other Topics Concern  . Not on file   Social History Narrative   Lives locally, has help available if needed.   Additional History:    Sleep: NA  Appetite:  Good   Assessment:   Musculoskeletal: Strength & Muscle Tone: decreased Gait & Station: unsteady Patient leans: Right   Psychiatric Specialty Exam: Physical Exam  ROS  There were no vitals taken for this visit.There is no weight on file to calculate BMI.  General Appearance: Casual  Eye Contact::  Good  Speech:  Clear and Coherent  Volume:  Normal  Mood:  Depressed  Affect:  Congruent  Thought Process:  Coherent  Orientation:  Full (Time, Place, and Person)  Thought Content:  WDL  Suicidal Thoughts:  No  Homicidal Thoughts:  No  Memory:   NA  Judgement:  Good  Insight:  Good  Psychomotor Activity:  Normal  Concentration:  Good  Recall:  Good  Fund of Knowledge:Good  Language: Good  Akathisia:  No  Handed:  Right  AIMS (if indicated):     Assets:  Communication Skills  ADL's:  Intact  Cognition: WNL  Sleep:        Current Medications: Current Outpatient Prescriptions  Medication Sig Dispense Refill  . ALPRAZolam (XANAX) 1 MG tablet Take 1 tablet (1 mg total) by mouth 3 (three) times daily as needed for anxiety. 90 tablet 4  . atenolol (TENORMIN) 100 MG tablet Take 1 tablet (100mg ) by mouth in the morning and 1/2 tablet (50mg ) in the evening. 45 tablet 2  .  atorvastatin (LIPITOR) 40 MG tablet Take 1 tablet (40 mg total) by mouth daily. 90 tablet 3  . clotrimazole-betamethasone (LOTRISONE) cream Apply 1 application topically daily as needed (for itching).    . fluocinonide cream (LIDEX) 5.46 % Apply 1 application topically as needed (For itching).     . furosemide (LASIX) 20 MG tablet Take 1 tablet (20 mg total) by mouth daily. 90 tablet 1  . gabapentin (NEURONTIN) 300 MG capsule TAKE ONE CAPSULE BY MOUTH THREE TIMES DAILY 270 capsule 1  . glipiZIDE (GLUCOTROL) 5 MG tablet TAKE ONE TABLET BY MOUTH DAILY BEFORE DINNER AS DIRECTED 90 tablet 1  . GLUCOPHAGE XR 500 MG 24 hr tablet Take 2 tablets (1,000 mg total) by mouth 2 (two) times daily with a meal. 120 tablet 2  . glucose blood (ONE TOUCH ULTRA TEST) test strip Use to test blood sugar 8 times daily. Dx: E11.59. 250 each 5  . HYDROcodone-acetaminophen (NORCO) 10-325 MG per tablet Take 1 tablet by mouth every 4 (four) hours as needed (pain). 150 tablet 0  . hydrocortisone 2.5 % ointment   3  . insulin glargine (LANTUS) 100 UNIT/ML injection Inject 45 Units into the skin at bedtime.    . Liraglutide (VICTOZA) 18 MG/3ML SOPN Inject 1.8 mg into the skin daily.    . Magnesium Oxide 400 MG CAPS Take 1 capsule (400 mg total) by mouth 2 (two) times daily. 30 capsule 11  .  nitroGLYCERIN (NITROSTAT) 0.4 MG SL tablet Place 1 tablet (0.4 mg total) under the tongue every 5 (five) minutes as needed. For chest pain 30 tablet 3  . potassium chloride SA (K-DUR,KLOR-CON) 20 MEQ tablet TAKE 1 TABLET BY MOUTH TWICE DAILY 60 tablet 6  . spironolactone (ALDACTONE) 25 MG tablet TAKE 1 TABLET BY MOUTH TWICE DAILY 60 tablet 5  . telmisartan (MICARDIS) 40 MG tablet TAKE 1 TABLET BY MOUTH DAILY 90 tablet 3  . warfarin (COUMADIN) 6 MG tablet TAKE AS DIRECTED BY ANTICOAGULATION CLINIC 135 tablet 0   No current facility-administered medications for this visit.    Lab Results: No results found. However, due to the size of the patient record, not all encounters were searched. Please check Results Review for a complete set of results.  Physical Findings: AIMS:  , ,  ,  ,    CIWA:    COWS:     Treatment Plan Summary: At this time the patient is distressed because of a physical limitation with her knees. This is a #1 problem and she sees Dr. Roderic Palau L for this condition. This patient is going to make plans for this problem by having surgery next few months. This or #1 problem. #2 is anxiety. Is fairly well controlled with Xanax 1 mg 3 times a day which I shall continue. I think this is very reasonable as a treatment and is very effective. This patient to return to see me in 3 months which is likely hopefully after her surgery.     Medical Decision Making:  Self-Limited or Minor (1)     Jamire Shabazz IRVING 11/27/2014, 2:02 PM

## 2014-11-27 NOTE — Telephone Encounter (Signed)
Patient is aware of results. Stated verbal understanding

## 2014-11-27 NOTE — Progress Notes (Signed)
Please send patient a copy of this report and review recommendations at the bottom with her. ThxLisbeth Renshaw Dunn PA-C

## 2014-12-02 ENCOUNTER — Ambulatory Visit
Admission: RE | Admit: 2014-12-02 | Discharge: 2014-12-02 | Disposition: A | Discharge status: Home / Self Care | Payer: Medicare Other | Source: Ambulatory Visit

## 2014-12-02 DIAGNOSIS — Z1231 Encounter for screening mammogram for malignant neoplasm of breast: Secondary | ICD-10-CM | POA: Diagnosis not present

## 2014-12-02 LAB — HM MAMMOGRAPHY

## 2014-12-03 ENCOUNTER — Ambulatory Visit: Payer: Medicare Other | Admitting: Cardiovascular Disease

## 2014-12-05 ENCOUNTER — Other Ambulatory Visit: Payer: Self-pay | Admitting: Cardiovascular Disease

## 2014-12-05 ENCOUNTER — Other Ambulatory Visit: Payer: Self-pay | Admitting: Physician Assistant

## 2014-12-05 ENCOUNTER — Other Ambulatory Visit: Payer: Self-pay | Admitting: Obstetrics & Gynecology

## 2014-12-05 DIAGNOSIS — R928 Other abnormal and inconclusive findings on diagnostic imaging of breast: Secondary | ICD-10-CM

## 2014-12-11 DIAGNOSIS — Z79891 Long term (current) use of opiate analgesic: Secondary | ICD-10-CM | POA: Diagnosis not present

## 2014-12-11 DIAGNOSIS — M150 Primary generalized (osteo)arthritis: Secondary | ICD-10-CM | POA: Diagnosis not present

## 2014-12-11 DIAGNOSIS — M75102 Unspecified rotator cuff tear or rupture of left shoulder, not specified as traumatic: Secondary | ICD-10-CM | POA: Diagnosis not present

## 2014-12-11 DIAGNOSIS — G894 Chronic pain syndrome: Secondary | ICD-10-CM | POA: Diagnosis not present

## 2014-12-12 DIAGNOSIS — E1151 Type 2 diabetes mellitus with diabetic peripheral angiopathy without gangrene: Secondary | ICD-10-CM | POA: Diagnosis not present

## 2014-12-12 DIAGNOSIS — L84 Corns and callosities: Secondary | ICD-10-CM | POA: Diagnosis not present

## 2014-12-12 DIAGNOSIS — L602 Onychogryphosis: Secondary | ICD-10-CM | POA: Diagnosis not present

## 2014-12-15 ENCOUNTER — Ambulatory Visit
Admission: RE | Admit: 2014-12-15 | Discharge: 2014-12-15 | Disposition: A | Discharge status: Home / Self Care | Payer: Medicare Other | Source: Ambulatory Visit | Attending: Obstetrics & Gynecology | Admitting: Obstetrics & Gynecology

## 2014-12-15 DIAGNOSIS — R928 Other abnormal and inconclusive findings on diagnostic imaging of breast: Secondary | ICD-10-CM

## 2014-12-20 ENCOUNTER — Other Ambulatory Visit: Payer: Self-pay | Admitting: Cardiovascular Disease

## 2014-12-22 ENCOUNTER — Other Ambulatory Visit: Payer: Self-pay | Admitting: Cardiovascular Disease

## 2014-12-23 ENCOUNTER — Encounter: Payer: Self-pay | Admitting: Physician Assistant

## 2014-12-23 NOTE — Progress Notes (Signed)
Cardiology Office Note Date:  12/24/2014  Patient ID:  Kristen, [name] [birth year], MRN [id number] PCP:  Kristen Haste, MD  Cardiologist:  Kristen [name]   Chief Complaint: f/u AF, CAD  History of Present Illness: Kristen [name] is a 69 y.o. female with history of CAD s/p CABG 2004 (stable cath 2014 demonstrating stable CAD and continued patency of her LIMA graft), paroxysmal atrial fibrillation, history of stroke, morbid obesity, diabetes, hypertension, left bundle branch block, fibromyalgia, panic and axiety. I saw her in 08/2014 following an episode of atrial fib from which she spontaneously converted before going to the hospital. She was started on Diltiazem 120mg  daily but felt increased fatigue and palpitations. We stopped diltiazem and increased atenolol. 2D echo 09/02/14: technically difficult study, EF 50-55%, incoordinate septal motion, definity contrast administered but echo windows remained poor (prior EF 45% in 2014 by cath). She had also noted fatigue but sleep study 11/2014 showed no significant sleep apnea. TSH WNL, Mg borderline low (MagOx added), and BMET OK except glucose 170s.  She comes in today for follow-up. Her atrial fib has remained quiescent. She feels like her fatigue improved after starting magnesium. Her biggest complaint is her right knee pain. She has "bone on bone" and has seen orthopedics. She says they have recommended she consider a knee replacement. She fell over a dishwasher a few months ago and since that time has periods of increased pain and numbness in that knee. She is worried in the back of her mind about a blood clot in her leg. She also has periods of intermittent chest pain that occur at random. It has happened 4-5x in the past month. She describes it as a poking-type discomfort in her left chest that goes across her upper left shoulder and is sometimes associated with a bloating sensation under her left breast. It is not worse with exertion or  inspiration, although she does not exercise due to her knee pain. It lasts seconds to minutes before resolving spontaneously. She does not report associated symptoms including diaphoresis, nausea, or dyspnea coniciding with the discomfort. She wonders what kind of cardiac workup she will need before her knee surgery if she chooses to proceed. She denies any LEE, orthopnea, PND. She has had weight gain which she attributes to binge eating.   Past Medical History  Diagnosis Date  . CAD (coronary artery disease)     a. s/p CABG 2004.b. stable cath 2014 demonstrating stable CAD and continued patency of her LIMA graft.  Marland Kitchen PAF (paroxysmal atrial fibrillation) ([place])   . Hypertension   . Hyperlipidemia   . Hypokalemia   . Diabetes mellitus   . Depression   . Lumbar back pain   . LBBB (left bundle branch block)   . Headache(784.0)   . Lymphadenitis   . Arthritis   . Bursitis   . Chronic anticoagulation     on coumadin  . Diastolic dysfunction     per echo in October 2012 with EF 50 to 55%  . Stroke [facility]) 2004    affected speech per pt  . GERD (gastroesophageal reflux disease) 10/23/2003  . Fibromyalgia   . Blood transfusion without reported diagnosis   . LV dysfunction     a. EF 45% by cath 2014. b. EF 50-55% by technically difficult echo in 08/2014.  . Morbid obesity ([place])     a. Sleep study negative for significant OSA in 11/2014.    Past Surgical History  Procedure Laterality Date  .  Angioplasty  laminectomy  . Lumbar laminectomy      x3  . Coronary artery bypass graft      LIMA to LAD   . Abdominal hysterectomy    . Cholecystectomy    . Tonsillectomy    . Knee arthroscopy    . Sphincterotomy    . Left heart catheterization with coronary/graft angiogram  12/07/2012    Procedure: LEFT HEART CATHETERIZATION WITH Beatrix Fetters;  Surgeon: Blane Ohara, MD;  Location: [facility] CATH LAB;  Service: Cardiovascular;;    Current Outpatient Prescriptions  Medication Sig  Dispense Refill  . ALPRAZolam (XANAX) 1 MG tablet Take 1 tablet (1 mg total) by mouth 3 (three) times daily as needed for anxiety. 90 tablet 4  . atenolol (TENORMIN) 100 MG tablet TAKE 1 TABLET BY MOUTH EVERY MORNING AND 1/2 TABLET EVERY EVENING 135 tablet 2  . atorvastatin (LIPITOR) 40 MG tablet Take 1 tablet (40 mg total) by mouth daily. 90 tablet 3  . clotrimazole-betamethasone (LOTRISONE) cream Apply 1 application topically daily as needed (for itching).    . fluocinonide cream (LIDEX) 4.81 % Apply 1 application topically as needed (For itching).     . furosemide (LASIX) 20 MG tablet Take 1 tablet (20 mg total) by mouth daily. 90 tablet 1  . gabapentin (NEURONTIN) 300 MG capsule TAKE ONE CAPSULE BY MOUTH THREE TIMES DAILY 270 capsule 1  . glipiZIDE (GLUCOTROL) 5 MG tablet TAKE ONE TABLET BY MOUTH DAILY BEFORE DINNER AS DIRECTED 90 tablet 1  . GLUCOPHAGE XR 500 MG 24 hr tablet Take 2 tablets (1,000 mg total) by mouth 2 (two) times daily with a meal. 120 tablet 2  . glucose blood (ONE TOUCH ULTRA TEST) test strip Use to test blood sugar 8 times daily. Dx: E11.59. 250 each 5  . HYDROcodone-acetaminophen (NORCO) 10-325 MG per tablet Take 1 tablet by mouth every 4 (four) hours as needed (pain). 150 tablet 0  . hydrocortisone 2.5 % ointment Apply 1 application topically 2 (two) times daily.   3  . insulin glargine (LANTUS) 100 UNIT/ML injection Inject 45 Units into the skin at bedtime.    . Liraglutide (VICTOZA) 18 MG/3ML SOPN Inject 1.8 mg into the skin daily.    . Magnesium Oxide 400 MG CAPS Take 1 capsule (400 mg total) by mouth 2 (two) times daily. 30 capsule 11  . nitroGLYCERIN (NITROSTAT) 0.4 MG SL tablet Place 0.4 mg under the tongue every 5 (five) minutes as needed for chest pain (x 3 doses).    . potassium chloride SA (K-DUR,KLOR-CON) 20 MEQ tablet TAKE 1 TABLET BY MOUTH TWICE DAILY 180 tablet 2  . spironolactone (ALDACTONE) 25 MG tablet TAKE 1 TABLET BY MOUTH TWICE DAILY 60 tablet 5  .  telmisartan (MICARDIS) 40 MG tablet TAKE 1 TABLET BY MOUTH DAILY 90 tablet 3  . warfarin (COUMADIN) 6 MG tablet TAKE AS DIRECTED BY ANTICOAGULATION CLINIC 135 tablet 0   No current facility-administered medications for this visit.    Allergies:   Metformin and related; Cleocin; Codeine; Doxycycline hyclate; Macrolides and ketolides; Morphine; Pentazocine lactate; Vibramycin; Definity; and Sulfonamide derivatives   Social History:  The patient  reports that she quit smoking about 13 years ago. She has never used smokeless tobacco. She reports that she does not drink alcohol or use illicit drugs.   Family History:  The patient's family history includes Arthritis in an other family member; Colon cancer in her paternal uncle; Diabetes in an other family member; Heart attack  in her father; Heart disease in her father; Hypertension in an other family member; Kidney disease in an other family member; Stroke in her mother and another family member.  ROS:  Please see the history of present illness.  All other systems are reviewed and otherwise negative.   PHYSICAL EXAM:  VS:  BP 114/72 mmHg  Pulse 95  Ht 5\' 1"  (1.549 m)  Wt 229 lb 6.4 oz (104.055 kg)  BMI 43.37 kg/m2 BMI: Body mass index is 43.37 kg/(m^2). Well nourished, well developed obese AAF, in no acute distress HEENT: normocephalic, atraumatic Neck: no JVD, carotid bruits or masses Cardiac:  normal S1, S2; RRR; no murmurs, rubs, or gallops Lungs:  clear to auscultation bilaterally, no wheezing, rhonchi or rales Abd: soft, nontender, no hepatomegaly, + BS MS: no deformity or atrophy Ext: no edema or erythema Skin: warm and dry, no rash Neuro:  moves all extremities spontaneously, no focal abnormalities noted, follows commands Psych: euthymic mood, full affect   EKG:  Done today shows NR 95bpm, LBBB, right axis deviation, no acute changes  Recent Labs: 07/15/2014: ALT 14 08/29/2014: BUN 17; Creatinine, Ser 0.80; Hemoglobin 13.7;  Platelets 300.0; Potassium 4.2; Sodium 136; TSH 3.67 09/09/2014: Magnesium 1.6  No results found for requested labs within last 365 days.   CrCl cannot be calculated (Patient has no serum creatinine result on file.).   Wt Readings from Last 3 Encounters:  12/24/14 229 lb 6.4 oz (104.055 kg)  11/21/14 227 lb (102.967 kg)  11/12/14 225 lb (102.059 kg)     Other studies reviewed: Additional studies/records reviewed today include: summarized above  ASSESSMENT AND PLAN:  1. Paroxysmal atrial fibrillation - improved on current regimen. INR is followed by PCP. 2. Leg pain - the patient is worried about a blood clot in her right leg ever since she fell over the dishwasher. Although her extremity does not carry the appearance of a clot and it is highly unlikely since she is on Coumadin, she inquired about further work-up to put her mind at ease. Will check d-dimer. If abnormal -> LE duplex to exclude this diagnosis. If normal, will defer further management to ortho. 3. CAD as above - chest pain is quite atypical. EKG is unchanged from prior but not particularly helpful given LBBB. Since she is contemplating proceeding with knee surgery later this year, will order Lexiscan nuclear stress test to risk stratify. She is also due for lipids. She is not fasting today so we will order for her to return at her next convenience. 4. Essential HTN - controlled. 5. LV dysfunction - EF improved by recent echo. She is not exhibiting signs of CHF. 6. Morbid obesity - she continues to struggle with her weight with binge eating which may be related to her adjustment disorder. Exercise is unfortunately very difficult for her with her knee problems. She is followed by behavioral health.  Disposition: F/u with Dr. Burt [name] in 4-6 months.  Current medicines are reviewed at length with the patient today.  The patient did not have any concerns regarding medicines.  Raechel Ache PA-C 12/24/2014 3:13 PM     [address] [place] [id number] [phone] (office)  [phone] (fax)

## 2014-12-24 ENCOUNTER — Encounter: Payer: Self-pay | Admitting: Physician Assistant

## 2014-12-24 ENCOUNTER — Ambulatory Visit (INDEPENDENT_AMBULATORY_CARE_PROVIDER_SITE_OTHER): Payer: Medicare Other | Admitting: Physician Assistant

## 2014-12-24 VITALS — BP 114/72 | HR 95 | Ht 61.0 in | Wt 229.4 lb

## 2014-12-24 DIAGNOSIS — I480 Paroxysmal atrial fibrillation: Secondary | ICD-10-CM | POA: Diagnosis not present

## 2014-12-24 DIAGNOSIS — M79604 Pain in right leg: Secondary | ICD-10-CM

## 2014-12-24 DIAGNOSIS — I10 Essential (primary) hypertension: Secondary | ICD-10-CM

## 2014-12-24 DIAGNOSIS — I519 Heart disease, unspecified: Secondary | ICD-10-CM

## 2014-12-24 DIAGNOSIS — R079 Chest pain, unspecified: Secondary | ICD-10-CM

## 2014-12-24 DIAGNOSIS — I2510 Atherosclerotic heart disease of native coronary artery without angina pectoris: Secondary | ICD-10-CM

## 2014-12-24 LAB — D-DIMER, QUANTITATIVE (NOT AT ARMC): D DIMER QUANT: 0.3 ug{FEU}/mL (ref 0.00–0.48)

## 2014-12-24 NOTE — Patient Instructions (Addendum)
Medication Instructions:  Your physician recommends that you continue on your current medications as directed. Please refer to the Current Medication list given to you today.    Labwork: TODAY:  DDIMER  AT Pt's Convenience:  FASTING Lipids & LFT   Testing/Procedures: Your physician has requested that you have a lexiscan myoview. For further information please visit [email or web address]. Please follow instruction sheet, as given.    Follow-Up: Your physician wants you to follow-up in: 4-6 MONTHS WITH DR. Burt Knack.  You will receive a reminder letter in the mail two months in advance. If you don't receive a letter, please call our office to schedule the follow-up appointment.    Any Other Special Instructions Will Be Listed Below (If Applicable).  Pharmacologic Stress Electrocardiogram A pharmacologic stress electrocardiogram is a heart (cardiac) test that uses nuclear imaging to evaluate the blood supply to your heart. This test may also be called a pharmacologic stress electrocardiography. Pharmacologic means that a medicine is used to increase your heart rate and blood pressure.  This stress test is done to find areas of poor blood flow to the heart by determining the extent of coronary artery disease (CAD). Some people exercise on a treadmill, which naturally increases the blood flow to the heart. For those people unable to exercise on a treadmill, a medicine is used. This medicine stimulates your heart and will cause your heart to beat harder and more quickly, as if you were exercising.  Pharmacologic stress tests can help determine:  The adequacy of blood flow to your heart during increased levels of activity in order to clear you for discharge home.  The extent of coronary artery blockage caused by CAD.  Your prognosis if you have suffered a heart attack.  The effectiveness of cardiac procedures done, such as an angioplasty, which can increase the circulation in your coronary  arteries.  Causes of chest pain or pressure. LET [facility] CARE PROVIDER KNOW ABOUT:  Any allergies you have.  All medicines you are taking, including vitamins, herbs, eye drops, creams, and over-the-counter medicines.  Previous problems you or members of your family have had with the use of anesthetics.  Any blood disorders you have.  Previous surgeries you have had.  Medical conditions you have.  Possibility of pregnancy, if this applies.  If you are currently breastfeeding. RISKS AND COMPLICATIONS Generally, this is a safe procedure. However, as with any procedure, complications can occur. Possible complications include:  You develop pain or pressure in the following areas:  Chest.  Jaw or neck.  Between your shoulder blades.  Radiating down your left arm.  Headache.  Dizziness or light-headedness.  Shortness of breath.  Increased or irregular heartbeat.  Low blood pressure.  Nausea or vomiting.  Flushing.  Redness going up the arm and slight pain during injection of medicine.  Heart attack (rare). BEFORE THE PROCEDURE   Avoid all forms of caffeine for 24 hours before your test or as directed by your health care provider. This includes coffee, tea (even decaffeinated tea), caffeinated sodas, chocolate, cocoa, and certain pain medicines.  Follow your health care provider's instructions regarding eating and drinking before the test.  Take your medicines as directed at regular times with water unless instructed otherwise. Exceptions may include:  If you have diabetes, ask how you are to take your insulin or pills. It is common to adjust insulin dosing the morning of the test.  If you are taking beta-blocker medicines, it is important to talk to your health  care provider about these medicines well before the date of your test. Taking beta-blocker medicines may interfere with the test. In some cases, these medicines need to be changed or stopped 24 hours or  more before the test.  If you wear a nitroglycerin patch, it may need to be removed prior to the test. Ask your health care provider if the patch should be removed before the test.  If you use an inhaler for any breathing condition, bring it with you to the test.  If you are an outpatient, bring a snack so you can eat right after the stress phase of the test.  Do not smoke for 4 hours prior to the test or as directed by your health care provider.  Do not apply lotions, powders, creams, or oils on your chest prior to the test.  Wear comfortable shoes and clothing. Let your health care provider know if you were unable to complete or follow the preparations for your test. PROCEDURE   Multiple patches (electrodes) will be put on your chest. If needed, small areas of your chest may be shaved to get better contact with the electrodes. Once the electrodes are attached to your body, multiple wires will be attached to the electrodes, and your heart rate will be monitored.  An IV access will be started. A nuclear trace (isotope) is given. The isotope may be given intravenously, or it may be swallowed. Nuclear refers to several types of radioactive isotopes, and the nuclear isotope lights up the arteries so that the nuclear images are clear. The isotope is absorbed by your body. This results in low radiation exposure.  A resting nuclear image is taken to show how your heart functions at rest.  A medicine is given through the IV access.  A second scan is done about 1 hour after the medicine injection and determines how your heart functions under stress.  During this stress phase, you will be connected to an electrocardiogram machine. Your blood pressure and oxygen levels will be monitored. AFTER THE PROCEDURE   Your heart rate and blood pressure will be monitored after the test.  You may return to your normal schedule, including diet,activities, and medicines, unless your health care provider  tells you otherwise.   This information is not intended to replace advice given to you by your health care provider. Make sure you discuss any questions you have with your health care provider.   Document Released: 07/10/2008 Document Revised: 02/26/2013 Document Reviewed: 10/29/2012 Elsevier Interactive Patient Education [employer].

## 2014-12-29 ENCOUNTER — Other Ambulatory Visit: Payer: Self-pay | Admitting: Internal Medicine

## 2014-12-29 ENCOUNTER — Ambulatory Visit: Payer: Medicare Other | Admitting: Internal Medicine

## 2014-12-29 ENCOUNTER — Other Ambulatory Visit: Payer: Self-pay | Admitting: *Deleted

## 2014-12-29 DIAGNOSIS — H25813 Combined forms of age-related cataract, bilateral: Secondary | ICD-10-CM | POA: Diagnosis not present

## 2014-12-29 DIAGNOSIS — E119 Type 2 diabetes mellitus without complications: Secondary | ICD-10-CM | POA: Diagnosis not present

## 2014-12-29 MED ORDER — METFORMIN HCL ER 500 MG PO TB24: 1000.0000 mg | ORAL_TABLET | Freq: Two times a day (BID) | ORAL | Status: DC

## 2014-12-30 ENCOUNTER — Ambulatory Visit (INDEPENDENT_AMBULATORY_CARE_PROVIDER_SITE_OTHER): Payer: Medicare Other | Admitting: Internal Medicine

## 2014-12-30 ENCOUNTER — Other Ambulatory Visit (INDEPENDENT_AMBULATORY_CARE_PROVIDER_SITE_OTHER): Payer: Medicare Other | Admitting: *Deleted

## 2014-12-30 ENCOUNTER — Encounter: Payer: Self-pay | Admitting: Internal Medicine

## 2014-12-30 VITALS — BP 132/80 | HR 94 | Temp 97.9°F | Resp 12 | Wt 228.0 lb

## 2014-12-30 DIAGNOSIS — E1151 Type 2 diabetes mellitus with diabetic peripheral angiopathy without gangrene: Secondary | ICD-10-CM

## 2014-12-30 LAB — POCT GLYCOSYLATED HEMOGLOBIN (HGB A1C): HEMOGLOBIN A1C: 6.9

## 2014-12-30 NOTE — Patient Instructions (Signed)
Please continue: - Victoza 1.8 mg in am - Metformin XR 1000 mg 2x a day - Glipizide 5 mg before dinner  Please stop Lantus and call me with sugars in 2 weeks.  Please come back for a follow-up appointment in 3 months.

## 2014-12-30 NOTE — Progress Notes (Signed)
Patient ID: Kristen [name], female   DOB: 07-Jan-1946, 69 y.o.   MRN: [id number]  HPI: Kristen [name] is a 69 y.o.-year-old female, returning for DM2, dx 2008, insulin-dependent, uncontrolled, with complications (CAD, PAD, h/o CVD, peripheral neuropathy). Last visit 5 mo ago.  She came off Lantus 45 units hs >> after she started to change her diet (no bread, portion control) >> now eating more carbs. She reintroduced Lantus 20 units 2-3x a week.  She has more joint pain recently: knees, back, shoulders, hands >> using more pain medicines >> more nausea >> eats more carbs.   Last hemoglobin A1c was: Lab Results  Component Value Date   HGBA1C 7.1* 07/24/2014   HGBA1C 9.0* 04/11/2014   HGBA1C 7.0* 09/27/2013  She had a steroid inj last month.  Pt is on a regimen of: - Glipizide 5 mg before dinner (did not decrease to 2.5 mg) - Victoza 1.8 mg - added 03/2013 - Metformin XR 1000 mg bid - restarted 04/2014 - Lantus 20 units 2-3x a week only We tried NPH at one point. She was on Levemir pen 30 u nits 2x a day - gets the med directly from [facility] - but had to stop b/c of price. She is now Metformin XR 1000 mg bid b/c N/D and palpitations with regular Metformin - retried with the same results She was on Bydureon in the past, then also on Novolog 60-70-75.  She tried Januvia before.   Pt checked her sugars 2-3x a day: - am: 90-100 >> 150-170 >> 70-120 (135) >> 90-120 >> 115-210 >> 89, 112-147, 187 >> 90-126 >> 90-140 - 2h after b'fast: 116 >> 96-140 >> upper 200s >> 135-165, 202 >> 95-155, 161 >> n/c - before lunch: 82-125 >> 79 >> n/c >> 100-140 >> 103-199 >> 180-200 >> 120-148, 194 >> 93-135 >> 129 - 2h after lunch: 111-204 >> 110-169 >> 121, 236 - before dinner: 99-168 >> n/c >> 80-120 >> 93-116 >> 187-210 >> 101-177, 214 >> 93-147, 175 >> 121-157 - after dinner: 120-188 >> 200-upper 200s >> 108, 136-180, 230 >> 89-170, 192 >> 150-195, 263 - bedtime: 90-100 >> n/c >> 130s  >> see above >> 112-168, 208 >> 69-168 >> 96-187 - nighttime: 56, 71-145 No recent lows;  she has hypoglycemia awareness at 100. Highest: 300s - steroid.   Pt does not have chronic kidney disease, last BUN/creatinine was:  Lab Results  Component Value Date   BUN 17 08/29/2014   CREATININE 0.80 08/29/2014  On Telmisartan. Last set of lipids: Lab Results  Component Value Date   CHOL 119 12/07/2012   HDL 36* 12/07/2012   LDLCALC 60 12/07/2012   LDLDIRECT 116.0 11/28/2008   TRIG 115 12/07/2012   CHOLHDL 3.3 12/07/2012  On Lipitor. Pt's last eye exam was in 04/28/2014. + hypertensive retinopathy.  Denies numbness and tingling in her legs.  She also has a history of hypertension, paroxysmal A. Fib-on anticoagulation, She had a stress test on 11/14/2012 b/c palpitations and tachycardia (on Holter monitor) >> cath on 11/30/2012, depression, osteoarthritis - status post arthroscopic knee surgeries x2, status post lumbar laminectomy x3, hyperlipidemia, morbid obesity, chronic lymphadenitis, GERD, hypothyroidism, headaches, fibromyalgia.  ROS: Constitutional: + fatigue, + weight gain, + hot flashes, no nocturia, + poor sleep Eyes: + blurry vision, no xerophthalmia ENT: no sore throat, no nodules palpated in throat, no dysphagia/odynophagia, no hoarseness Cardiovascular: + CP/no palpitations/SOB/no leg swelling Respiratory: no cough/SOB with activity  Gastrointestinal: no vomiting/diarrhea/constipation Musculoskeletal: + muscle  aches/+ joint aches Skin: no rashes Neurological: no tremors/numbness/tingling/dizziness, no headaches  I reviewed pt's medications, allergies, PMH, social hx, family hx, and changes were documented in the history of present illness. Otherwise, unchanged from my initial visit note.   PE: BP 132/80 mmHg  Pulse 94  Temp(Src) 97.9 F (36.6 C) (Oral)  Resp 12  Wt 228 lb (103.42 kg)  SpO2 95% Body mass index is 43.1 kg/(m^2). Wt Readings from Last 3 Encounters:   12/30/14 228 lb (103.42 kg)  12/24/14 229 lb 6.4 oz (104.055 kg)  11/21/14 227 lb (102.967 kg)   Constitutional: overweight, in NAD, full supraclavicular fat pads Eyes: PERRLA, EOMI, no exophthalmos ENT: moist mucous membranes, no thyromegaly, no cervical lymphadenopathy Cardiovascular: RRR, No MRG Respiratory: CTA B Gastrointestinal: abdomen soft, NT, ND, BS+ Musculoskeletal: no deformities, strength intact in all 4 Skin: moist, warm, acanthosis nigricans  ASSESSMENT: 1. DM2, insulin-dependent, uncontrolled, with complications - CAD- Echo 12/2010: EF 50-55%, status post CABG 2004: LIMA to LAD - cards: Dr. Lia Foyer. Also has a LBBB.  - PAD - h/o stroke - peripheral neuropathy - on neurontin  PLAN:  1. Patient with a several-year history of diabetes, previously on basal-bolus regimen, then off all insulin with great control! She now uses Lantus ~ 2-3x a week as she has more dietary indiscretions lately. As her HbA1c is 6.9% (improved ) >> advised her to stay off Lantus and call me in 2 weeks with sugars to see if we need to restart. - I advised her to Patient Instructions  Please continue: - Victoza 1.8 mg in am - Metformin XR 1000 mg 2x a day - Glipizide 5 mg before dinner  Please stop Lantus and call me with sugars in 2 weeks.  Please come back for a follow-up appointment in 3 months.  - up to date with eye exams an flu shot - given more sugar logs - RTC in 3 mo

## 2014-12-31 ENCOUNTER — Telehealth (HOSPITAL_COMMUNITY): Payer: Self-pay | Admitting: *Deleted

## 2014-12-31 ENCOUNTER — Other Ambulatory Visit (HOSPITAL_COMMUNITY): Payer: Self-pay | Admitting: Psychiatry

## 2014-12-31 NOTE — Telephone Encounter (Signed)
Patient given detailed instructions per Myocardial Perfusion Study Information Sheet for the test on 01/06/15 at 1230. Patient notified to arrive 15 minutes early and that it is imperative to arrive on time for appointment to keep from having the test rescheduled.  If you need to cancel or reschedule your appointment, please call the office within 24 hours of your appointment. Failure to do so may result in a cancellation of your appointment, and a $50 no show fee. Patient verbalized understanding.Hubbard Robinson, RN

## 2015-01-06 ENCOUNTER — Ambulatory Visit (HOSPITAL_COMMUNITY): Payer: Medicare Other | Attending: Cardiovascular Disease

## 2015-01-06 ENCOUNTER — Encounter (HOSPITAL_COMMUNITY): Payer: Self-pay

## 2015-01-06 DIAGNOSIS — I10 Essential (primary) hypertension: Secondary | ICD-10-CM | POA: Insufficient documentation

## 2015-01-06 DIAGNOSIS — R42 Dizziness and giddiness: Secondary | ICD-10-CM | POA: Diagnosis not present

## 2015-01-06 DIAGNOSIS — E119 Type 2 diabetes mellitus without complications: Secondary | ICD-10-CM | POA: Diagnosis not present

## 2015-01-06 DIAGNOSIS — R0609 Other forms of dyspnea: Secondary | ICD-10-CM | POA: Diagnosis not present

## 2015-01-06 DIAGNOSIS — R9439 Abnormal result of other cardiovascular function study: Secondary | ICD-10-CM | POA: Insufficient documentation

## 2015-01-06 DIAGNOSIS — R002 Palpitations: Secondary | ICD-10-CM | POA: Insufficient documentation

## 2015-01-06 DIAGNOSIS — R079 Chest pain, unspecified: Secondary | ICD-10-CM | POA: Insufficient documentation

## 2015-01-06 DIAGNOSIS — I447 Left bundle-branch block, unspecified: Secondary | ICD-10-CM | POA: Insufficient documentation

## 2015-01-06 MED ORDER — TECHNETIUM TC 99M SESTAMIBI GENERIC - CARDIOLITE
31.5000 | Freq: Once | INTRAVENOUS | Status: AC | PRN
  Administered 2015-01-06: 32 via INTRAVENOUS

## 2015-01-06 MED ORDER — REGADENOSON 0.4 MG/5ML IV SOLN
0.4000 mg | Freq: Once | INTRAVENOUS | Status: AC
  Administered 2015-01-06: 0.4 mg via INTRAVENOUS

## 2015-01-07 ENCOUNTER — Ambulatory Visit (HOSPITAL_COMMUNITY): Payer: Medicare Other | Attending: Cardiology

## 2015-01-07 DIAGNOSIS — R0989 Other specified symptoms and signs involving the circulatory and respiratory systems: Secondary | ICD-10-CM

## 2015-01-07 LAB — MYOCARDIAL PERFUSION IMAGING
CHL CUP NUCLEAR SDS: 1
CHL CUP NUCLEAR SRS: 6
CHL CUP NUCLEAR SSS: 5
CHL CUP RESTING HR STRESS: 81 {beats}/min
CSEPPHR: 110 {beats}/min
LHR: 0.36
LV sys vol: 43 mL
LVDIAVOL: 84 mL
TID: 1

## 2015-01-07 MED ORDER — TECHNETIUM TC 99M SESTAMIBI GENERIC - CARDIOLITE
32.3000 | Freq: Once | INTRAVENOUS | Status: AC | PRN
  Administered 2015-01-07: 32.3 via INTRAVENOUS

## 2015-01-08 ENCOUNTER — Telehealth: Payer: Self-pay | Admitting: Physician Assistant

## 2015-01-08 NOTE — Telephone Encounter (Signed)
F/u    Pt calling back to get stress test results.

## 2015-01-08 NOTE — Telephone Encounter (Signed)
I spoke with the pt and made her aware of results.  The pt would like to have knee surgery in the near future and I advised her to contact her orthopaedic MD and if they are going to proceed with surgery then they can request cardiac clearance from our office. Pt agreed with plan.

## 2015-01-08 NOTE — Telephone Encounter (Signed)
New message ° ° ° ° °Returning a call to get stress test results °

## 2015-01-09 DIAGNOSIS — M75102 Unspecified rotator cuff tear or rupture of left shoulder, not specified as traumatic: Secondary | ICD-10-CM | POA: Diagnosis not present

## 2015-01-09 DIAGNOSIS — Z79891 Long term (current) use of opiate analgesic: Secondary | ICD-10-CM | POA: Diagnosis not present

## 2015-01-09 DIAGNOSIS — G894 Chronic pain syndrome: Secondary | ICD-10-CM | POA: Diagnosis not present

## 2015-01-09 DIAGNOSIS — M150 Primary generalized (osteo)arthritis: Secondary | ICD-10-CM | POA: Diagnosis not present

## 2015-01-15 ENCOUNTER — Other Ambulatory Visit: Payer: Medicare Other

## 2015-01-15 DIAGNOSIS — Z7901 Long term (current) use of anticoagulants: Secondary | ICD-10-CM

## 2015-01-15 DIAGNOSIS — I10 Essential (primary) hypertension: Secondary | ICD-10-CM

## 2015-01-15 LAB — PROTIME-INR
INR: 2.86 — ABNORMAL HIGH (ref <1.50)
Prothrombin Time: 30.4 seconds — ABNORMAL HIGH (ref 11.6–15.2)

## 2015-01-19 ENCOUNTER — Other Ambulatory Visit: Payer: Self-pay | Admitting: Physical Medicine and Rehabilitation

## 2015-01-19 DIAGNOSIS — M25561 Pain in right knee: Secondary | ICD-10-CM

## 2015-01-26 ENCOUNTER — Other Ambulatory Visit: Payer: Self-pay | Admitting: Internal Medicine

## 2015-01-27 ENCOUNTER — Other Ambulatory Visit: Payer: Self-pay | Admitting: Internal Medicine

## 2015-01-27 ENCOUNTER — Other Ambulatory Visit: Payer: Self-pay | Admitting: Family Medicine

## 2015-01-27 NOTE — Telephone Encounter (Signed)
Is this okay to refill? 

## 2015-01-28 ENCOUNTER — Other Ambulatory Visit: Payer: Self-pay | Admitting: Cardiovascular Disease

## 2015-02-05 ENCOUNTER — Ambulatory Visit
Admission: RE | Admit: 2015-02-05 | Discharge: 2015-02-05 | Disposition: A | Discharge status: Home / Self Care | Payer: Medicare Other | Source: Ambulatory Visit | Attending: Physical Medicine and Rehabilitation | Admitting: Physical Medicine and Rehabilitation

## 2015-02-05 DIAGNOSIS — M25561 Pain in right knee: Secondary | ICD-10-CM | POA: Diagnosis not present

## 2015-02-05 DIAGNOSIS — M1711 Unilateral primary osteoarthritis, right knee: Secondary | ICD-10-CM | POA: Diagnosis not present

## 2015-02-05 DIAGNOSIS — M179 Osteoarthritis of knee, unspecified: Secondary | ICD-10-CM | POA: Diagnosis not present

## 2015-02-06 DIAGNOSIS — G894 Chronic pain syndrome: Secondary | ICD-10-CM | POA: Diagnosis not present

## 2015-02-06 DIAGNOSIS — M150 Primary generalized (osteo)arthritis: Secondary | ICD-10-CM | POA: Diagnosis not present

## 2015-02-06 DIAGNOSIS — Z79891 Long term (current) use of opiate analgesic: Secondary | ICD-10-CM | POA: Diagnosis not present

## 2015-02-06 DIAGNOSIS — M47816 Spondylosis without myelopathy or radiculopathy, lumbar region: Secondary | ICD-10-CM | POA: Diagnosis not present

## 2015-02-09 ENCOUNTER — Telehealth: Payer: Self-pay | Admitting: Cardiovascular Disease

## 2015-02-09 DIAGNOSIS — E785 Hyperlipidemia, unspecified: Secondary | ICD-10-CM

## 2015-02-09 NOTE — Telephone Encounter (Signed)
New Message    Pt calling stating she needs fasting labs, appt already scheduled, no orders in Epic.

## 2015-02-09 NOTE — Telephone Encounter (Signed)
Orders placed for lipid and liver.

## 2015-02-13 ENCOUNTER — Other Ambulatory Visit: Payer: Medicare Other

## 2015-02-16 ENCOUNTER — Other Ambulatory Visit (INDEPENDENT_AMBULATORY_CARE_PROVIDER_SITE_OTHER): Payer: Medicare Other | Admitting: *Deleted

## 2015-02-16 DIAGNOSIS — E785 Hyperlipidemia, unspecified: Secondary | ICD-10-CM | POA: Diagnosis not present

## 2015-02-16 LAB — HEPATIC FUNCTION PANEL
ALT: 17 U/L (ref 6–29)
AST: 19 U/L (ref 10–35)
Albumin: 4.2 g/dL (ref 3.6–5.1)
Alkaline Phosphatase: 59 U/L (ref 33–130)
BILIRUBIN DIRECT: 0.1 mg/dL (ref <=0.2)
BILIRUBIN TOTAL: 0.4 mg/dL (ref 0.2–1.2)
Indirect Bilirubin: 0.3 mg/dL (ref 0.2–1.2)
Total Protein: 7.3 g/dL (ref 6.1–8.1)

## 2015-02-16 LAB — LIPID PANEL
Cholesterol: 97 mg/dL — ABNORMAL LOW (ref 125–200)
HDL: 31 mg/dL — ABNORMAL LOW (ref >=46)
LDL Cholesterol: 41 mg/dL (ref <130)
Total CHOL/HDL Ratio: 3.1 Ratio (ref <=5.0)
Triglycerides: 123 mg/dL (ref <150)
VLDL: 25 mg/dL (ref <30)

## 2015-02-26 ENCOUNTER — Ambulatory Visit (INDEPENDENT_AMBULATORY_CARE_PROVIDER_SITE_OTHER): Payer: Medicare Other | Admitting: Psychiatry

## 2015-02-26 VITALS — BP 108/76 | HR 92 | Resp 14 | Ht 61.0 in | Wt 221.2 lb

## 2015-02-26 DIAGNOSIS — F4323 Adjustment disorder with mixed anxiety and depressed mood: Secondary | ICD-10-CM | POA: Diagnosis not present

## 2015-02-26 MED ORDER — ALPRAZOLAM 1 MG PO TABS: 1.0000 mg | ORAL_TABLET | Freq: Three times a day (TID) | ORAL | Status: DC | PRN

## 2015-02-26 NOTE — Progress Notes (Signed)
[facility] MD Progress Note  02/26/2015 1:45 PM Kristen [name]  MRN:  [id number] Subjective:  Scared Principal Problem: Adjustment disorder with anxious mood state Diagnosis:  Adjustment disorder with anxious mood state Today the patient continues that the issue of having bilateral knee problems. She know she needs replacements. She torments herself over when she should have it done. She torments over if she should have done. The patient is on blood thinner and is frightened of blood clots. She has a cardiac condition as well. She's recently had an MRI that demonstrated very severely damaged knees. The patient has to walk up stairs to get her home. Patient is having some left shoulder problems as well which would be a big problem if she was getting her knees operated on. The patient has moderate pain. She describes it from 1-10 being at a 5 to an 8. She takes an opiate and Neurontin for it. She gets a pain clinic Dr. Hardin Negus. It should be noted the patient takes Xanax 1 mg 3 times a day and her pain doctor know she is on. The patients anxiety is relatively controlled. She denies daily depression. She does claim she has problems staying asleep. The patient is eating well. The patient is a fair amount of energy. The patient doesn't drink any alcohol and has no psychotic symptoms at all. Generally she is physically well other than her knees and her left shoulder. The patient's husband has PTSD and at times can be very irritable. The patient tries to stay active. Her primary care doctor is somebody she just met in the last one year and she's not sure about having a discussion about her knees and the risk of surgery. I strongly encouraged her to make an appointment and to deal with this issue. Patient Active Problem List   Diagnosis Date Noted  . Snoring [R06.83] 11/26/2014  . LV dysfunction [I51.9]   . Encounter for therapeutic drug monitoring [Z51.81] 05/03/2013  . Angina decubitus ([place]) [I20.8] 12/07/2012   . DM (diabetes mellitus), type 2 with peripheral vascular complications ([place]) [I95.18] 12/01/2012  . PVC's/nonsustained VT [I49.3] 10/24/2012  . Morbid obesity with BMI of 40.0-44.9, adult ([place]) [E66.01, Z68.41] 03/15/2012  . H/O: CVA (cerebrovascular accident) [Z86.73] 02/09/2012  . Chest pain, musculoskeletal [R07.89] 07/20/2011  . Hypothyroidism [E03.9] 05/27/2011  . Fibromyalgia [M79.7] 10/08/2010  . Long term current use of anticoagulant [Z79.01] 05/05/2010  . Brachial neuritis or radiculitis [M54.12] 10/30/2009  . Complicated grief [A41.66] 04/24/2009  . BACK PAIN, LUMBAR, WITH RADICULOPATHY [IMO0002] 05/12/2008  . LBBB [I44.7] 03/19/2008  . Chronic lymphadenitis [I88.1] 07/23/2007  . Depression with anxiety [F41.8] 01/29/2007  . Osteoarthritis [M19.90] 11/22/2006  . BURSITIS, ACROMIOCLAVICULAR, LEFT [M71.9, A63.016] 10/03/2006  . HYPERLIPIDEMIA NEC/NOS [E78.5] 09/13/2006  . Coronary atherosclerosis [I25.10] 09/13/2006  . Paroxysmal atrial fibrillation ([place]) [I48.0] 09/13/2006  . GERD [K21.9] 09/13/2006  . Essential hypertension [I10] 08/30/2006   Total Time spent with patient: 30 minutes  Past Psychiatric History:   Past Medical History:  Past Medical History  Diagnosis Date  . CAD (coronary artery disease)     a. s/p CABG 2004.b. stable cath 2014 demonstrating stable CAD and continued patency of her LIMA graft.  Marland Kitchen PAF (paroxysmal atrial fibrillation) ([place])   . Hypertension   . Hyperlipidemia   . Hypokalemia   . Diabetes mellitus   . Depression   . Lumbar back pain   . LBBB (left bundle branch block)   . Headache(784.0)   . Lymphadenitis   .  Arthritis   . Bursitis   . Chronic anticoagulation     on coumadin  . Diastolic dysfunction     per echo in October 2012 with EF 50 to 55%  . Stroke [facility]) 2004    affected speech per pt  . GERD (gastroesophageal reflux disease) 10/23/2003  . Fibromyalgia   . Blood transfusion without reported diagnosis   . LV  dysfunction     a. EF 45% by cath 2014. b. EF 50-55% by technically difficult echo in 08/2014.  . Morbid obesity ([place])     a. Sleep study negative for significant OSA in 11/2014.    Past Surgical History  Procedure Laterality Date  . Angioplasty  laminectomy  . Lumbar laminectomy      x3  . Coronary artery bypass graft      LIMA to LAD   . Abdominal hysterectomy    . Cholecystectomy    . Tonsillectomy    . Knee arthroscopy    . Sphincterotomy    . Left heart catheterization with coronary/graft angiogram  12/07/2012    Procedure: LEFT HEART CATHETERIZATION WITH Beatrix Fetters;  Surgeon: Blane Ohara, MD;  Location: [facility] CATH LAB;  Service: Cardiovascular;;   Family History:  Family History  Problem Relation Age of Onset  . Heart attack Father   . Heart disease Father   . Kidney disease    . Stroke    . Arthritis    . Hypertension    . Diabetes    . Stroke Mother   . Colon cancer Paternal Uncle    Family Psychiatric  History:  Social History:  History  Alcohol Use No     History  Drug Use No    Social History   Social History  . Marital Status: Legally Separated    Spouse Name: N/A  . Number of Children: N/A  . Years of Education: N/A   Social History Main Topics  . Smoking status: Former Smoker    Quit date: 03/07/2001  . Smokeless tobacco: Never Used  . Alcohol Use: No  . Drug Use: No  . Sexual Activity: Not on file   Other Topics Concern  . Not on file   Social History Narrative   Lives locally, has help available if needed.   Additional Social History:                         Sleep: Fair  Appetite:  Fair  Current Medications: Current Outpatient Prescriptions  Medication Sig Dispense Refill  . ALPRAZolam (XANAX) 1 MG tablet Take 1 tablet (1 mg total) by mouth 3 (three) times daily as needed for anxiety. 90 tablet 4  . atenolol (TENORMIN) 100 MG tablet TAKE 1 TABLET BY MOUTH EVERY MORNING AND 1/2 TABLET EVERY EVENING  (Patient taking differently: TAKE 1 TABLET BY MOUTH EVERY MORNING AND 1/2 TABLET EVERY EVENING; PRN) 135 tablet 2  . atorvastatin (LIPITOR) 40 MG tablet TAKE 1 TABLET BY MOUTH DAILY 90 tablet 0  . clotrimazole-betamethasone (LOTRISONE) cream Apply 1 application topically daily as needed (for itching).    . fluocinonide cream (LIDEX) 5.63 % Apply 1 application topically as needed (For itching).     . furosemide (LASIX) 20 MG tablet Take 1 tablet (20 mg total) by mouth daily. 90 tablet 1  . gabapentin (NEURONTIN) 300 MG capsule TAKE ONE CAPSULE BY MOUTH THREE TIMES DAILY 270 capsule 1  . glipiZIDE (GLUCOTROL) 5 MG tablet TAKE ONE  TABLET BY MOUTH DAILY BEFORE DINNER AS DIRECTED 90 tablet 1  . glucose blood (ONE TOUCH ULTRA TEST) test strip Use to test blood sugar 8 times daily. Dx: E11.59. 250 each 5  . HYDROcodone-acetaminophen (NORCO) 10-325 MG per tablet Take 1 tablet by mouth every 4 (four) hours as needed (pain). 150 tablet 0  . hydrocortisone 2.5 % ointment Apply 1 application topically 2 (two) times daily.   3  . Liraglutide (VICTOZA) 18 MG/3ML SOPN Inject 1.8 mg into the skin daily.    . Magnesium Oxide 400 MG CAPS Take 1 capsule (400 mg total) by mouth 2 (two) times daily. 30 capsule 11  . metFORMIN (GLUCOPHAGE XR) 500 MG 24 hr tablet Take 2 tablets (1,000 mg total) by mouth 2 (two) times daily with a meal. **PT NEEDS FOLLOW UP APPT** 120 tablet 1  . nitroGLYCERIN (NITROSTAT) 0.4 MG SL tablet Place 0.4 mg under the tongue every 5 (five) minutes as needed for chest pain (x 3 doses).    . potassium chloride SA (K-DUR,KLOR-CON) 20 MEQ tablet TAKE 1 TABLET BY MOUTH TWICE DAILY 180 tablet 2  . potassium chloride SA (K-DUR,KLOR-CON) 20 MEQ tablet TAKE 1 TABLET BY MOUTH TWICE DAILY 60 tablet 11  . spironolactone (ALDACTONE) 25 MG tablet TAKE 1 TABLET BY MOUTH TWICE DAILY 60 tablet 5  . telmisartan (MICARDIS) 40 MG tablet TAKE 1 TABLET BY MOUTH DAILY 90 tablet 3  . warfarin (COUMADIN) 6 MG tablet  TAKE AS DIRECTED BY ANTICOAGULATION CLINIC 135 tablet 0   No current facility-administered medications for this visit.    Lab Results: No results found. However, due to the size of the patient record, not all encounters were searched. Please check Results Review for a complete set of results.  Physical Findings: AIMS:  , ,  ,  ,    CIWA:    COWS:     Musculoskeletal: Strength & Muscle Tone: within normal limits Gait & Station: normal Patient leans: Right  Psychiatric Specialty Exam: ROS  Blood pressure 108/76, pulse 92, resp. rate 14, height '5\' 1"'$  (1.549 m), weight 221 lb 3.2 oz (100.336 kg).Body mass index is 41.82 kg/(m^2).  General Appearance: NA  Eye Contact::  Good  Speech:  Clear and Coherent  Volume:  Normal  Mood:  NA  Affect:  Congruent  Thought Process:  Coherent  Orientation:  Full (Time, Place, and Person)  Thought Content:  WDL  Suicidal Thoughts:  No  Homicidal Thoughts:  No  Memory:  NA  Judgement:  Good  Insight:  Good  Psychomotor Activity:  Normal  Concentration:  Good  Recall:  Good  Fund of Knowledge:Fair  Language: Good  Akathisia:  No  Handed:  Left  AIMS (if indicated):     Assets:  Desire for Improvement  ADL's:  Intact  Cognition: WNL  Sleep:      Treatment Plan Summary: At this time the patient is at her baseline. She takes Neurontin for pain which might help her anxiety. Nonetheless she takes Xanax appropriately 1 mg 3 times a day. We had a long discussion about eventually trying once again to come off of Xanax area she's tried before with the help of Klonopin but been unsuccessful. The possibility of using Tranxene instead might be considered. Nonetheless during the holidays and while she is in the process of making this decision about her knees we will continue her Xanax as it is. We did suggest that she start taking melatonin 5 mg at night  for sleep. I strongly suggested that she make contact with her primary care doctor and try to deal  with his question of chronic pain and problems with her knees. I fear she's going to need both knees replaced and ultimately require a rehabilitation process. This patient she'll return to see me in 4 months. Note is the patient is not suicidal and she is actually functioning quite well. Daily contact with patient to assess and evaluate symptoms and progress in treatment  Kristen [name] [place] 02/26/2015, 1:45 PM

## 2015-02-27 ENCOUNTER — Ambulatory Visit: Payer: Medicare Other

## 2015-02-27 DIAGNOSIS — Z7901 Long term (current) use of anticoagulants: Secondary | ICD-10-CM

## 2015-02-27 LAB — PROTIME-INR
INR: 2.98 — AB (ref <1.50)
PROTHROMBIN TIME: 31.4 s — AB (ref 11.6–15.2)

## 2015-03-05 ENCOUNTER — Other Ambulatory Visit: Payer: Self-pay | Admitting: Internal Medicine

## 2015-03-06 DIAGNOSIS — G894 Chronic pain syndrome: Secondary | ICD-10-CM | POA: Diagnosis not present

## 2015-03-06 DIAGNOSIS — M47816 Spondylosis without myelopathy or radiculopathy, lumbar region: Secondary | ICD-10-CM | POA: Diagnosis not present

## 2015-03-06 DIAGNOSIS — Z79891 Long term (current) use of opiate analgesic: Secondary | ICD-10-CM | POA: Diagnosis not present

## 2015-03-06 DIAGNOSIS — M150 Primary generalized (osteo)arthritis: Secondary | ICD-10-CM | POA: Diagnosis not present

## 2015-03-08 ENCOUNTER — Encounter: Payer: Self-pay | Admitting: Physician Assistant

## 2015-03-10 ENCOUNTER — Other Ambulatory Visit: Payer: Self-pay | Admitting: *Deleted

## 2015-03-10 MED ORDER — MAGNESIUM OXIDE 400 MG PO CAPS: 400.0000 mg | ORAL_CAPSULE | Freq: Two times a day (BID) | ORAL | Status: DC

## 2015-03-17 ENCOUNTER — Other Ambulatory Visit: Payer: Self-pay | Admitting: Family Medicine

## 2015-03-19 ENCOUNTER — Institutional Professional Consult (permissible substitution): Payer: Medicare Other | Admitting: Family Medicine

## 2015-03-30 ENCOUNTER — Other Ambulatory Visit: Payer: Medicare Other

## 2015-03-30 ENCOUNTER — Other Ambulatory Visit: Payer: Medicare Other | Admitting: Family Medicine

## 2015-03-30 DIAGNOSIS — Z7901 Long term (current) use of anticoagulants: Secondary | ICD-10-CM | POA: Diagnosis not present

## 2015-03-30 LAB — PROTIME-INR
INR: 2.28 — AB (ref <1.50)
PROTHROMBIN TIME: 25.6 s — AB (ref 11.6–15.2)

## 2015-04-02 ENCOUNTER — Ambulatory Visit: Payer: Self-pay | Admitting: Internal Medicine

## 2015-04-03 DIAGNOSIS — M150 Primary generalized (osteo)arthritis: Secondary | ICD-10-CM | POA: Diagnosis not present

## 2015-04-03 DIAGNOSIS — G894 Chronic pain syndrome: Secondary | ICD-10-CM | POA: Diagnosis not present

## 2015-04-03 DIAGNOSIS — M47816 Spondylosis without myelopathy or radiculopathy, lumbar region: Secondary | ICD-10-CM | POA: Diagnosis not present

## 2015-04-03 DIAGNOSIS — Z79891 Long term (current) use of opiate analgesic: Secondary | ICD-10-CM | POA: Diagnosis not present

## 2015-04-04 ENCOUNTER — Other Ambulatory Visit: Payer: Self-pay | Admitting: Internal Medicine

## 2015-04-06 ENCOUNTER — Other Ambulatory Visit: Payer: Self-pay | Admitting: Internal Medicine

## 2015-04-09 ENCOUNTER — Ambulatory Visit (INDEPENDENT_AMBULATORY_CARE_PROVIDER_SITE_OTHER): Payer: Medicare Other | Admitting: Internal Medicine

## 2015-04-09 ENCOUNTER — Encounter: Payer: Self-pay | Admitting: Internal Medicine

## 2015-04-09 VITALS — BP 116/84 | HR 99 | Temp 98.0°F | Ht 61.0 in | Wt 220.0 lb

## 2015-04-09 DIAGNOSIS — E1151 Type 2 diabetes mellitus with diabetic peripheral angiopathy without gangrene: Secondary | ICD-10-CM

## 2015-04-09 LAB — BASIC METABOLIC PANEL WITH GFR
BUN: 17 mg/dL (ref 7–25)
CO2: 23 mmol/L (ref 20–31)
CREATININE: 0.99 mg/dL (ref 0.50–0.99)
Calcium: 9.9 mg/dL (ref 8.6–10.4)
Chloride: 105 mmol/L (ref 98–110)
GFR, EST AFRICAN AMERICAN: 68 mL/min (ref >=60)
GFR, Est Non African American: 59 mL/min — ABNORMAL LOW (ref >=60)
GLUCOSE: 132 mg/dL — AB (ref 65–99)
Potassium: 4.5 mmol/L (ref 3.5–5.3)
Sodium: 139 mmol/L (ref 135–146)

## 2015-04-09 LAB — POCT GLYCOSYLATED HEMOGLOBIN (HGB A1C): HEMOGLOBIN A1C: 6.9

## 2015-04-09 MED ORDER — GLIPIZIDE 5 MG PO TABS: ORAL_TABLET | ORAL | Status: DC

## 2015-04-09 NOTE — Patient Instructions (Signed)
Please continue: - Victoza 1.8 mg in am - Metformin XR 1000 mg 2x a day - Glipizide 5 mg before dinner - Please add Glipizide 2.5 mg daily before breakfast.  Please let me know if the sugars are consistently <80 or >200.  Please stop at the lab.  Please come back for a follow-up appointment in 3 months.

## 2015-04-09 NOTE — Progress Notes (Signed)
Patient ID: Kristen [name], female   DOB: 23-May-1945, 70 y.o.   MRN: [id number]  HPI: Kristen [name] is a 70 y.o.-year-old female, returning for DM2, dx 2008, insulin-dependent, uncontrolled, with complications (CAD, PAD, h/o CVD, peripheral neuropathy). Last visit 3 mo ago.  She came off Lantus at last visit as sugars were controlled. However, over the Holidays >> sugars higher especially in the latter half of the day.  Last hemoglobin A1c was: Lab Results  Component Value Date   HGBA1C 6.9 12/30/2014   HGBA1C 7.1* 07/24/2014   HGBA1C 9.0* 04/11/2014  She had a steroid inj last month.  Pt is on a regimen of: - Glipizide 5 mg before dinner  - Victoza 1.8 mg - added 03/2013 - Metformin XR 1000 mg bid - restarted 04/2014 Stopped Lantus 20 units in 12/2014. We tried NPH. She was on Levemir pen 30 units 2x a day - gets the med directly from [facility] - but had to stop b/c of price. She is now Metformin XR 1000 mg bid b/c N/D and palpitations with regular Metformin - retried with the same results She was on Bydureon in the past, then also on Novolog 60-70-75.  She tried Januvia before.   Pt checked her sugars 2-3x a day: - am: 90-100 >> 150-170 >> 70-120 (135) >> 90-120 >> 115-210 >> 89, 112-147, 187 >> 90-126 >> 90-140 >> 87, 109-143 - 2h after b'fast: 116 >> 96-140 >> upper 200s >> 135-165, 202 >> 95-155, 161 >> n/c >> 117, 156 - before lunch: 82-125 >> 79 >> n/c >> 100-140 >> 103-199 >> 180-200 >> 120-148, 194 >> 93-135 >> 129 >> 104-172, 197, 215 - 2h after lunch: 111-204 >> 110-169 >> 121, 236 >> 107-199 - before dinner: 99-168 >> n/c >> 80-120 >> 93-116 >> 187-210 >> 101-177, 214 >> 93-147, 175 >> 121-157 >> 114-160, 180, 196 - after dinner: 120-188 >> 200-upper 200s >> 108, 136-180, 230 >> 89-170, 192 >> 150-195, 263 >> 123-203 - bedtime: 90-100 >> n/c >> 130s >> see above >> 112-168, 208 >> 69-168 >> 96-187 >> 106-184 - nighttime: 56, 71-145 >> n/c No recent lows;   she has hypoglycemia awareness at 100 >> 87. Highest: 300s - steroid >> 314 x1 (stressed)  Pt does not have chronic kidney disease, last BUN/creatinine was:  Lab Results  Component Value Date   BUN 17 08/29/2014   CREATININE 0.80 08/29/2014  On Telmisartan. Last set of lipids: Lab Results  Component Value Date   CHOL 97* 02/16/2015   HDL 31* 02/16/2015   LDLCALC 41 02/16/2015   LDLDIRECT 116.0 11/28/2008   TRIG 123 02/16/2015   CHOLHDL 3.1 02/16/2015  On Lipitor. Pt's last eye exam was in 02/2015. Dr. Hazle Quant. + hypertensive retinopathy.  Denies numbness and tingling in her legs. She had a foot exam in 04/03/2015.  She also has a history of hypertension, paroxysmal A. Fib-on anticoagulation, depression, osteoarthritis - status post arthroscopic knee surgeries x2, status post lumbar laminectomy x3, hyperlipidemia, morbid obesity, chronic lymphadenitis, GERD, hypothyroidism, headaches, fibromyalgia.  ROS: Constitutional:no fatigue, no weight gain, + hot flashes, no nocturia Eyes: + blurry vision, no xerophthalmia ENT: no sore throat, no nodules palpated in throat, no dysphagia/odynophagia, no hoarseness Cardiovascular: + CP/no palpitations/SOB/no leg swelling Respiratory: no cough/SOB with activity  Gastrointestinal: no vomiting/diarrhea/constipation Musculoskeletal: + muscle aches/+ joint aches Skin: no rashes Neurological: no tremors/numbness/tingling/dizziness, no headaches  I reviewed pt's medications, allergies, PMH, social hx, family hx, and changes were documented in  the history of present illness. Otherwise, unchanged from my initial visit note.   PE: BP 116/84 mmHg  Pulse 99  Temp(Src) 98 F (36.7 C) (Oral)  Ht '5\' 1"'$  (1.549 m)  Wt 220 lb (99.791 kg)  BMI 41.59 kg/m2  SpO2 95% Body mass index is 41.59 kg/(m^2). Wt Readings from Last 3 Encounters:  04/09/15 220 lb (99.791 kg)  02/26/15 221 lb 3.2 oz (100.336 kg)  02/05/15 227 lb (102.967 kg)   Constitutional:  overweight, in NAD, full supraclavicular fat pads Eyes: PERRLA, EOMI, no exophthalmos ENT: moist mucous membranes, no thyromegaly, no cervical lymphadenopathy Cardiovascular: tachycardia, RR, No MRG Respiratory: CTA B Gastrointestinal: abdomen soft, NT, ND, BS+ Musculoskeletal: no deformities, strength intact in all 4 Skin: moist, warm, acanthosis nigricans  ASSESSMENT: 1. DM2, insulin-dependent, uncontrolled, with complications - CAD- Echo 12/2010: EF 50-55%, status post CABG 2004: LIMA to LAD - cards: Dr. Lia Foyer. Also has a LBBB.  - PAD - h/o stroke - peripheral neuropathy - on neurontin  PLAN:  1. Patient with a several-year history of diabetes, previusously on basal-bolus regimen, then off all insulin with great control! She now had slightly higher sugars later in the day (especially over the Holidays) >> will add Glipizide 2.5 mg in am.  - I advised her to Patient Instructions  Please continue: - Victoza 1.8 mg in am - Metformin XR 1000 mg 2x a day - Glipizide 5 mg before dinner - Please add Glipizide 2.5 mg daily before breakfast.  Please let me know if the sugars are consistently <80 or >200.  Please stop at the lab.  Please come back for a follow-up appointment in 3 months.  - up to date with eye exams and flu shot - per her request >> will check BMP - given more sugar logs - will check HbA1c today >> 6.9% (stable, great!) - RTC in 3 mo  Office Visit on 04/09/2015  Component Date Value Ref Range Status  . Sodium 04/09/2015 139  135 - 146 mmol/L Final  . Potassium 04/09/2015 4.5  3.5 - 5.3 mmol/L Final  . Chloride 04/09/2015 105  98 - 110 mmol/L Final  . CO2 04/09/2015 23  20 - 31 mmol/L Final  . Glucose, Bld 04/09/2015 132* 65 - 99 mg/dL Final  . BUN 04/09/2015 17  7 - 25 mg/dL Final  . Creat 04/09/2015 0.99  0.50 - 0.99 mg/dL Final  . Calcium 04/09/2015 9.9  8.6 - 10.4 mg/dL Final  . GFR, Est African American 04/09/2015 68  >=60 mL/min Final  . GFR, Est Non  African American 04/09/2015 59* >=60 mL/min Final   Comment:   The estimated GFR is a calculation valid for adults (>=57 years old) that uses the CKD-EPI algorithm to adjust for age and sex. It is   not to be used for children, pregnant women, hospitalized patients,    patients on dialysis, or with rapidly changing kidney function. According to the NKDEP, eGFR >89 is normal, 60-89 shows mild impairment, 30-59 shows moderate impairment, 15-29 shows severe impairment and <15 is ESRD.     Marland Kitchen HM Diabetic Foot Exam 04/02/2014 normal   Final  . Hemoglobin A1C 04/09/2015 6.9   Final

## 2015-04-15 ENCOUNTER — Encounter: Payer: Self-pay | Admitting: Cardiovascular Disease

## 2015-04-17 ENCOUNTER — Encounter: Payer: Self-pay | Admitting: Internal Medicine

## 2015-04-20 ENCOUNTER — Encounter: Payer: Self-pay | Admitting: Family Medicine

## 2015-04-21 ENCOUNTER — Other Ambulatory Visit: Payer: Self-pay | Admitting: Family Medicine

## 2015-04-21 MED ORDER — SPIRONOLACTONE 25 MG PO TABS: 25.0000 mg | ORAL_TABLET | Freq: Two times a day (BID) | ORAL | Status: DC

## 2015-04-29 DIAGNOSIS — M170 Bilateral primary osteoarthritis of knee: Secondary | ICD-10-CM | POA: Diagnosis not present

## 2015-04-30 ENCOUNTER — Other Ambulatory Visit: Payer: Self-pay | Admitting: Internal Medicine

## 2015-05-01 DIAGNOSIS — M47816 Spondylosis without myelopathy or radiculopathy, lumbar region: Secondary | ICD-10-CM | POA: Diagnosis not present

## 2015-05-01 DIAGNOSIS — M150 Primary generalized (osteo)arthritis: Secondary | ICD-10-CM | POA: Diagnosis not present

## 2015-05-01 DIAGNOSIS — Z79891 Long term (current) use of opiate analgesic: Secondary | ICD-10-CM | POA: Diagnosis not present

## 2015-05-01 DIAGNOSIS — G894 Chronic pain syndrome: Secondary | ICD-10-CM | POA: Diagnosis not present

## 2015-05-04 ENCOUNTER — Other Ambulatory Visit: Payer: Self-pay | Admitting: Internal Medicine

## 2015-05-06 ENCOUNTER — Ambulatory Visit (INDEPENDENT_AMBULATORY_CARE_PROVIDER_SITE_OTHER): Payer: Medicare Other | Admitting: Cardiovascular Disease

## 2015-05-06 ENCOUNTER — Encounter: Payer: Self-pay | Admitting: Cardiovascular Disease

## 2015-05-06 VITALS — BP 116/80 | HR 88 | Ht 61.0 in | Wt 223.4 lb

## 2015-05-06 DIAGNOSIS — R300 Dysuria: Secondary | ICD-10-CM | POA: Diagnosis not present

## 2015-05-06 DIAGNOSIS — R350 Frequency of micturition: Secondary | ICD-10-CM

## 2015-05-06 DIAGNOSIS — I480 Paroxysmal atrial fibrillation: Secondary | ICD-10-CM | POA: Diagnosis not present

## 2015-05-06 MED ORDER — ATENOLOL 100 MG PO TABS: ORAL_TABLET | ORAL | Status: DC

## 2015-05-06 NOTE — Patient Instructions (Signed)
Medication Instructions:  Your physician recommends that you continue on your current medications as directed. Please refer to the Current Medication list given to you today.  Labwork: Your physician recommends that you have lab work today: Urinalysis  Testing/Procedures: No new orders.   Follow-Up: Your physician wants you to follow-up in: 6 MONTHS with Dr Burt Knack.  You will receive a reminder letter in the mail two months in advance. If you don't receive a letter, please call our office to schedule the follow-up appointment.   Any Other Special Instructions Will Be Listed Below (If Applicable).     If you need a refill on your cardiac medications before your next appointment, please call your pharmacy.

## 2015-05-06 NOTE — Progress Notes (Signed)
Cardiology Office Note Date:  05/06/2015   ID:  Oler, Tuszynski [birth year], MRN [id number]  PCP:  Wyatt Haste, MD  Cardiologist:  Sherren Mocha, MD    Chief Complaint  Patient presents with  . Shortness of Breath    History of Present Illness: Kristen [name] is a 70 y.o. female who presents for follow-up of CAD. Medical problems include CABG 2004 (stable cath 2014 demonstrating stable CAD and continued patency of her LIMA graft), paroxysmal atrial fibrillation, history of stroke, morbid obesity, diabetes, hypertension, left bundle branch block, fibromyalgia, panic and anxiety.  She's considering knee replacement surgery by Dr Alvan Dame. She has palpitations especially with stressful days. She denies exertional chest pain or pressure. Palpitations are worse at night. DOE is unchanged. Also complains of hot flashes but this is chronic. She hasn't required any NTG since I've seen her last. No leg swelling.   Past Medical History  Diagnosis Date  . CAD (coronary artery disease)     a. s/p CABG 2004.b. stable cath 2014 demonstrating stable CAD and continued patency of her LIMA graft.  Marland Kitchen PAF (paroxysmal atrial fibrillation) ([place])   . Hypertension   . Hyperlipidemia   . Hypokalemia   . Diabetes mellitus   . Depression   . Lumbar back pain   . LBBB (left bundle branch block)   . Headache(784.0)   . Lymphadenitis   . Arthritis   . Bursitis   . Chronic anticoagulation     on coumadin  . Diastolic dysfunction     per echo in October 2012 with EF 50 to 55%  . Stroke [facility]) 2004    affected speech per pt  . GERD (gastroesophageal reflux disease) 10/23/2003  . Fibromyalgia   . Blood transfusion without reported diagnosis   . LV dysfunction     a. EF 45% by cath 2014. b. EF 50-55% by technically difficult echo in 08/2014.  . Morbid obesity ([place])     a. Sleep study negative for significant OSA in 11/2014.    Past Surgical History  Procedure Laterality Date  .  Angioplasty  laminectomy  . Lumbar laminectomy      x3  . Coronary artery bypass graft      LIMA to LAD   . Abdominal hysterectomy    . Cholecystectomy    . Tonsillectomy    . Knee arthroscopy    . Sphincterotomy    . Left heart catheterization with coronary/graft angiogram  12/07/2012    Procedure: LEFT HEART CATHETERIZATION WITH Beatrix Fetters;  Surgeon: Blane Ohara, MD;  Location: [facility] CATH LAB;  Service: Cardiovascular;;    Current Outpatient Prescriptions  Medication Sig Dispense Refill  . ALPRAZolam (XANAX) 1 MG tablet Take 1 tablet (1 mg total) by mouth 3 (three) times daily as needed for anxiety. 90 tablet 4  . atenolol (TENORMIN) 100 MG tablet 1 tablet in the morning and 0.5 tablet in the evening. 45 tablet 8  . atorvastatin (LIPITOR) 40 MG tablet TAKE 1 TABLET BY MOUTH EVERY DAY 90 tablet 0  . clotrimazole-betamethasone (LOTRISONE) cream Apply 1 application topically daily as needed (for itching).    . fluocinonide cream (LIDEX) [id number] % Apply 1 application topically as needed (For itching).     . furosemide (LASIX) 20 MG tablet TAKE 1 TABLET BY MOUTH DAILY 90 tablet 0  . gabapentin (NEURONTIN) 300 MG capsule TAKE ONE CAPSULE BY MOUTH THREE TIMES DAILY 270 capsule 1  . glipiZIDE (GLUCOTROL) 5 MG tablet  TAKE 1 TABLET BY MOUTH DAILY BEFORE DINNER and 1/2 tablet before breakfast 90 tablet 1  . glucose blood (ONE TOUCH ULTRA TEST) test strip USE TO TEST BLOOD SUGAR 4 TIMES DAILY. DX: E11.51 250 each 2  . HYDROcodone-acetaminophen (NORCO) 10-325 MG per tablet Take 1 tablet by mouth every 4 (four) hours as needed (pain). 150 tablet 0  . hydrocortisone 2.5 % ointment Apply 1 application topically 2 (two) times daily.   3  . Liraglutide (VICTOZA) 18 MG/3ML SOPN Inject 1.8 mg into the skin daily.    . Magnesium Oxide 400 MG CAPS Take 1 capsule (400 mg total) by mouth 2 (two) times daily. 60 capsule 11  . metFORMIN (GLUCOPHAGE-XR) 500 MG 24 hr tablet TAKE 2 TABLETS BY MOUTH  TWICE DAILY WITH A MEAL 120 tablet 2  . nitroGLYCERIN (NITROSTAT) 0.4 MG SL tablet Place 0.4 mg under the tongue every 5 (five) minutes as needed for chest pain (x 3 doses).    . potassium chloride SA (K-DUR,KLOR-CON) 20 MEQ tablet TAKE 1 TABLET BY MOUTH TWICE DAILY 60 tablet 11  . spironolactone (ALDACTONE) 25 MG tablet Take 1 tablet (25 mg total) by mouth 2 (two) times daily. 180 tablet 3  . telmisartan (MICARDIS) 40 MG tablet TAKE 1 TABLET BY MOUTH DAILY 90 tablet 3  . warfarin (COUMADIN) 6 MG tablet TAKE AS DIRECTED BY ANTICOAGULATION CLINIC 135 tablet 0   No current facility-administered medications for this visit.    Allergies:   Metformin and related; Cleocin; Codeine; Doxycycline hyclate; Macrolides and ketolides; Morphine; Pentazocine lactate; Vibramycin; Definity; and Sulfonamide derivatives   Social History:  The patient  reports that she quit smoking about 14 years ago. She has never used smokeless tobacco. She reports that she does not drink alcohol or use illicit drugs.   Family History:  The patient's  family history includes Colon cancer in her paternal uncle; Heart attack in her father; Heart disease in her father; Stroke in her mother.    ROS:  Please see the history of present illness.  Otherwise, review of systems is positive for dysuria, urinary frequency.  All other systems are reviewed and negative.    PHYSICAL EXAM: VS:  BP 116/80 mmHg  Pulse 88  Ht 5\' 1"  (1.549 m)  Wt 101.334 kg (223 lb 6.4 oz)  BMI 42.23 kg/m2 , BMI Body mass index is 42.23 kg/(m^2). GEN: Well nourished, well developed, pleasant obese woman in no acute distress HEENT: normal Neck: no JVD, no masses. No carotid bruits Cardiac: RRR without murmur or gallop                Respiratory:  clear to auscultation bilaterally, normal work of breathing GI: soft, nontender, nondistended, + BS MS: no deformity or atrophy Ext: no pretibial edema, pedal pulses 2+= bilaterally Skin: warm and dry, no  rash Neuro:  Strength and sensation are intact Psych: euthymic mood, full affect  EKG:  EKG is ordered today. The ekg ordered today shows NSR 88 bpm, LBBB, no change from previous tracings  Recent Labs: 08/29/2014: Hemoglobin 13.7; Platelets 300.0; TSH 3.67 09/09/2014: Magnesium 1.6 02/16/2015: ALT 17 04/09/2015: BUN 17; Creat 0.99; Potassium 4.5; Sodium 139   Lipid Panel     Component Value Date/Time   CHOL 97* 02/16/2015 0854   TRIG 123 02/16/2015 0854   HDL 31* 02/16/2015 0854   CHOLHDL 3.1 02/16/2015 0854   VLDL 25 02/16/2015 0854   LDLCALC 41 02/16/2015 0854   LDLDIRECT 116.0 11/28/2008 1122  Wt Readings from Last 3 Encounters:  05/06/15 101.334 kg (223 lb 6.4 oz)  04/09/15 99.791 kg (220 lb)  02/26/15 100.336 kg (221 lb 3.2 oz)     Cardiac Studies Reviewed: 09/02/2014: Left ventricle: The cavity size was normal. Wall thickness was normal. Incoordinate septal motion. Systolic function was normal. The estimated ejection fraction was in the range of 50% to 55%. Images were inadequate for LV wall motion assessment. The study is not technically sufficient to allow evaluation of LV diastolic function.  ------------------------------------------------------------------- Aorta: Aortic root: The aortic root was normal in size. Ascending aorta: The ascending aorta was normal in size.  ------------------------------------------------------------------- Mitral valve: Poorly visualized. Doppler:   Peak gradient (D): 6 mm Hg.  ------------------------------------------------------------------- Left atrium: Poorly visualized.  ------------------------------------------------------------------- Atrial septum: Poorly visualized.  ------------------------------------------------------------------- Right ventricle: Poorly visualized. The cavity size was normal. Wall thickness was normal. Systolic function was  normal.  ------------------------------------------------------------------- Pulmonic valve:  Poorly visualized. The valve appears to be grossly normal.  Doppler: There was no significant regurgitation.  ------------------------------------------------------------------- Tricuspid valve:  Doppler: There was no significant regurgitation.  ------------------------------------------------------------------- Pulmonary artery:  Poorly visualized. The main pulmonary artery was normal-sized.  ------------------------------------------------------------------- Right atrium: Poorly visualized.  ------------------------------------------------------------------- Pericardium: There was no pericardial effusion.  ------------------------------------------------------------------- Systemic veins: Not visualized.  Myoview Stress Test 01/07/2015: Study Highlights     The left ventricular ejection fraction is mildly decreased (45-54%).  Nuclear stress EF: 49%.  Defect 1: There is a medium defect of moderate severity present in the apex location. This is most likely to be apical thinning.  This is a low risk study.    ASSESSMENT AND PLAN: 1.  CAD, native vessel: no angina at present. Seems to be doing well from a cardiac perspective. Recent Nuclear and Echo studies reviewed. I think she would be at low risk of cardiac complications if she decides to pursue total knee surgery.  2. Paroxysmal atrial fibrillation: maintaining sinus rhythm without symptoms related to atrial fib. Tolerating oral anticoagulation with warfarin without bleeding problems.   3. Chronic diastolic CHF, NYHA II: unchanged since her evaluation last year. No change in Rx recommended.   4. Essential HTN with CHF: BP well-controlled. Working hard on lifestyle/diet modification.   5. Hyperlipidemia: treated with atorvastatin.   Current medicines are reviewed with the patient today.  The patient does not have  concerns regarding medicines.  Labs/ tests ordered today include:   Orders Placed This Encounter  Procedures  . Urinalysis  . EKG 12-Lead   Disposition:   FU 6 months  Signed, Sherren Mocha, MD  05/06/2015 1:43 PM    [facility] [address], [place], [place]  [address] Phone: [phone]; Fax: [phone]

## 2015-05-07 LAB — URINALYSIS
Bilirubin Urine: NEGATIVE
Glucose, UA: NEGATIVE
HGB URINE DIPSTICK: NEGATIVE
KETONES UR: NEGATIVE
Nitrite: POSITIVE — AB
PROTEIN: NEGATIVE
SPECIFIC GRAVITY, URINE: 1.013 (ref 1.001–1.035)
pH: 5.5 (ref 5.0–8.0)

## 2015-05-08 ENCOUNTER — Encounter: Payer: Self-pay | Admitting: Family Medicine

## 2015-05-08 ENCOUNTER — Ambulatory Visit (INDEPENDENT_AMBULATORY_CARE_PROVIDER_SITE_OTHER): Payer: Medicare Other | Admitting: Family Medicine

## 2015-05-08 VITALS — BP 100/60 | HR 95 | Wt 222.0 lb

## 2015-05-08 DIAGNOSIS — Z7901 Long term (current) use of anticoagulants: Secondary | ICD-10-CM

## 2015-05-08 DIAGNOSIS — N390 Urinary tract infection, site not specified: Secondary | ICD-10-CM

## 2015-05-08 LAB — PROTIME-INR
INR: 2.67 — ABNORMAL HIGH (ref <1.50)
Prothrombin Time: 28.8 seconds — ABNORMAL HIGH (ref 11.6–15.2)

## 2015-05-08 MED ORDER — CIPROFLOXACIN HCL 500 MG PO TABS: 500.0000 mg | ORAL_TABLET | Freq: Two times a day (BID) | ORAL | Status: DC

## 2015-05-08 NOTE — Progress Notes (Signed)
   Subjective:    Patient ID: Kristen [name], female    DOB: [birth year], 70 y.o.   MRN: [id number]  HPI She is here for another PT/INR. She also has been having frequency and dysuria. She did have a urinalysis done the other day at cardiology.   Review of Systems     Objective:   Physical Exam Alert and in no distress. Urinalysis from yesterday does show positive for nitrite.      Assessment & Plan:  UTI (lower urinary tract infection) - Plan: ciprofloxacin (CIPRO) 500 MG tablet  Long term (current) use of anticoagulants - Plan: Protime-INR She has allergies to multiple medications and therefore we'll give her Cipro in spite of several complications from the Coumadin. She is aware of this.

## 2015-05-19 ENCOUNTER — Other Ambulatory Visit: Payer: Self-pay | Admitting: Internal Medicine

## 2015-05-22 ENCOUNTER — Other Ambulatory Visit: Payer: Self-pay | Admitting: *Deleted

## 2015-05-22 ENCOUNTER — Telehealth: Payer: Self-pay | Admitting: Internal Medicine

## 2015-05-22 MED ORDER — GLIPIZIDE 5 MG PO TABS: ORAL_TABLET | ORAL | Status: DC

## 2015-05-22 NOTE — Telephone Encounter (Signed)
rx sent

## 2015-05-22 NOTE — Telephone Encounter (Signed)
New rx for glipizide needs to be called in for 45 tabs please with the new dosing

## 2015-05-26 ENCOUNTER — Encounter: Payer: Self-pay | Admitting: Cardiovascular Disease

## 2015-05-29 DIAGNOSIS — G894 Chronic pain syndrome: Secondary | ICD-10-CM | POA: Diagnosis not present

## 2015-05-29 DIAGNOSIS — M150 Primary generalized (osteo)arthritis: Secondary | ICD-10-CM | POA: Diagnosis not present

## 2015-05-29 DIAGNOSIS — Z79891 Long term (current) use of opiate analgesic: Secondary | ICD-10-CM | POA: Diagnosis not present

## 2015-05-29 DIAGNOSIS — M47816 Spondylosis without myelopathy or radiculopathy, lumbar region: Secondary | ICD-10-CM | POA: Diagnosis not present

## 2015-06-02 ENCOUNTER — Other Ambulatory Visit (HOSPITAL_COMMUNITY): Payer: Self-pay | Admitting: Psychiatry

## 2015-06-12 ENCOUNTER — Ambulatory Visit: Payer: Medicare Other | Admitting: Family Medicine

## 2015-06-12 DIAGNOSIS — Z7901 Long term (current) use of anticoagulants: Secondary | ICD-10-CM | POA: Diagnosis not present

## 2015-06-12 LAB — PROTIME-INR
INR: 2.87 — AB (ref <1.50)
Prothrombin Time: 30.5 seconds — ABNORMAL HIGH (ref 11.6–15.2)

## 2015-06-13 NOTE — Patient Instructions (Signed)
Continue present dosing and recheck in 1 month

## 2015-06-16 ENCOUNTER — Other Ambulatory Visit: Payer: Self-pay | Admitting: Family Medicine

## 2015-06-16 NOTE — Telephone Encounter (Signed)
Dr.Lalonde is this okay to refill 

## 2015-06-26 DIAGNOSIS — Z79891 Long term (current) use of opiate analgesic: Secondary | ICD-10-CM | POA: Diagnosis not present

## 2015-06-26 DIAGNOSIS — M47816 Spondylosis without myelopathy or radiculopathy, lumbar region: Secondary | ICD-10-CM | POA: Diagnosis not present

## 2015-06-26 DIAGNOSIS — G894 Chronic pain syndrome: Secondary | ICD-10-CM | POA: Diagnosis not present

## 2015-06-26 DIAGNOSIS — M150 Primary generalized (osteo)arthritis: Secondary | ICD-10-CM | POA: Diagnosis not present

## 2015-07-01 ENCOUNTER — Ambulatory Visit (HOSPITAL_COMMUNITY): Payer: Self-pay | Admitting: Psychiatry

## 2015-07-07 ENCOUNTER — Ambulatory Visit (INDEPENDENT_AMBULATORY_CARE_PROVIDER_SITE_OTHER): Payer: Medicare Other | Admitting: Internal Medicine

## 2015-07-07 ENCOUNTER — Other Ambulatory Visit (INDEPENDENT_AMBULATORY_CARE_PROVIDER_SITE_OTHER): Payer: Medicare Other | Admitting: *Deleted

## 2015-07-07 ENCOUNTER — Encounter: Payer: Self-pay | Admitting: Internal Medicine

## 2015-07-07 VITALS — BP 120/68 | HR 89 | Wt 218.8 lb

## 2015-07-07 DIAGNOSIS — E1151 Type 2 diabetes mellitus with diabetic peripheral angiopathy without gangrene: Secondary | ICD-10-CM | POA: Diagnosis not present

## 2015-07-07 LAB — POCT GLYCOSYLATED HEMOGLOBIN (HGB A1C): HEMOGLOBIN A1C: 7.1

## 2015-07-07 NOTE — Progress Notes (Signed)
Patient ID: Kristen [name], female   DOB: 04-02-1945, 70 y.o.   MRN: [id number]  HPI: Kristen [name] is a 70 y.o.-year-old female, returning for DM2, dx 2008, insulin-dependent, uncontrolled, with complications (CAD, PAD, h/o CVD, peripheral neuropathy). Last visit 3 mo ago.  She had dietary indiscretions since last visit >> sugars higher.  Last hemoglobin A1c was: Lab Results  Component Value Date   HGBA1C 6.9 04/09/2015   HGBA1C 6.9 12/30/2014   HGBA1C 7.1* 07/24/2014  She had a steroid inj last month.  Pt is on a regimen of: - Victoza 1.8 mg in am - Metformin XR 1000 mg 2x a day - Glipizide 5 mg before dinner - Glipizide 2.5 mg daily before breakfast. Stopped Lantus 20 units in 12/2014. We tried NPH. She was on Levemir pen 30 units 2x a day - gets the med directly from [facility] - but had to stop b/c of price. She is now Metformin XR 1000 mg bid b/c N/D and palpitations with regular Metformin - retried with the same results She was on Bydureon in the past, then also on Novolog 60-70-75.  She tried Januvia before.   Pt checked her sugars 2-3x a day - no log: - am: 70-120 (135) >> 90-120 >> 115-210 >> 89, 112-147, 187 >> 90-126 >> 90-140 >> 87, 109-143 >> 120-130s - 2h after b'fast: 116 >> 96-140 >> upper 200s >> 135-165, 202 >> 95-155, 161 >> n/c >> 117, 156 >> n/c - before lunch: 103-199 >> 180-200 >> 120-148, 194 >> 93-135 >> 129 >> 104-172, 197, 215 >> 150-160s - 2h after lunch: 111-204 >> 110-169 >> 121, 236 >> 107-199>> n/c - before dinner: 93-116 >> 187-210 >> 101-177, 214 >> 93-147, 175 >> 121-157 >> 114-160, 180, 196 >> n/c - after dinner: 120-188 >> 200-upper 200s >> 108, 136-180, 230 >> 89-170, 192 >> 150-195, 263 >> 123-203  >> 160-180 - bedtime: 90-100 >> n/c >> 130s >> see above >> 112-168, 208 >> 69-168 >> 96-187 >> 106-184 >> see above - nighttime: 56, 71-145 >> n/c No recent lows;  she has hypoglycemia awareness at 100 >> 87 >> 87. Highest: 300s -  steroid >> 314 x1 (stressed) >> 345x1.  Pt does not have chronic kidney disease, last BUN/creatinine was:  Lab Results  Component Value Date   BUN 17 04/09/2015   CREATININE 0.99 04/09/2015  On Telmisartan. Last set of lipids: Lab Results  Component Value Date   CHOL 97* 02/16/2015   HDL 31* 02/16/2015   LDLCALC 41 02/16/2015   LDLDIRECT 116.0 11/28/2008   TRIG 123 02/16/2015   CHOLHDL 3.1 02/16/2015  On Lipitor. Pt's last eye exam was in 02/2015. Dr. Bing Plume. + hypertensive retinopathy.  Denies numbness and tingling in her legs. She had a foot exam in 04/03/2015.  She also has a history of hypertension, paroxysmal A. Fib-on anticoagulation, depression, osteoarthritis - status post arthroscopic knee surgeries x2, status post lumbar laminectomy x3, hyperlipidemia, morbid obesity, chronic lymphadenitis, GERD, hypothyroidism, headaches, fibromyalgia.  ROS: Constitutional: + fatigue, no weight gain, + hot flashes, no nocturia Eyes: + blurry vision, no xerophthalmia ENT: no sore throat, no nodules palpated in throat, no dysphagia/odynophagia, no hoarseness Cardiovascular: no CP/+ palpitations/+ SOB/+ leg swelling Respiratory: no cough/+ SOB with activity  Gastrointestinal: no vomiting/diarrhea/constipation Musculoskeletal: + muscle aches/+ joint aches Skin: no rashes, + hair loss Neurological: no tremors/numbness/tingling/dizziness, + headaches  I reviewed pt's medications, allergies, PMH, social hx, family hx, and changes were documented in the history  of present illness. Otherwise, unchanged from my initial visit note.   PE: BP 120/68 mmHg  Pulse 89  Wt 218 lb 12.8 oz (99.247 kg)  SpO2 95% Body mass index is 41.36 kg/(m^2). Wt Readings from Last 3 Encounters:  07/07/15 218 lb 12.8 oz (99.247 kg)  05/08/15 222 lb (100.699 kg)  05/06/15 223 lb 6.4 oz (101.334 kg)   Constitutional: overweight, in NAD, full supraclavicular fat pads Eyes: PERRLA, EOMI, no exophthalmos ENT: moist  mucous membranes, no thyromegaly, no cervical lymphadenopathy Cardiovascular: tachycardia, RR, No MRG Respiratory: CTA B Gastrointestinal: abdomen soft, NT, ND, BS+ Musculoskeletal: no deformities, strength intact in all 4 Skin: moist, warm, acanthosis nigricans  ASSESSMENT: 1. DM2, insulin-dependent, uncontrolled, with complications - CAD- Echo 12/2010: EF 50-55%, status post CABG 2004: LIMA to LAD - cards: Dr. Lia Foyer. Also has a LBBB.  - PAD - h/o stroke - peripheral neuropathy - on neurontin  PLAN:  1. Patient with a several-year history of diabetes, previusously on basal-bolus regimen, then off all insulin with great control! She now has slightly higher sugars as the day goes by 2/2 family gatherings and dietary indiscretions, but will get back on the previous diet. Will not change regimen. - I advised her to Patient Instructions  Please continue: - Victoza 1.8 mg in am - Metformin XR 1000 mg 2x a day - Glipizide 5 mg before dinner - Glipizide 2.5 mg daily before breakfast.  Please let me know if the sugars are consistently <80 or >200.  Please come back for a follow-up appointment in 3 months.  - up to date with eye exams and flu shot - given more sugar logs - will check HbA1c today >> 7.1% (slightly higher) - RTC in 3 mo

## 2015-07-07 NOTE — Patient Instructions (Signed)
Please continue: - Victoza 1.8 mg in am - Metformin XR 1000 mg 2x a day - Glipizide 5 mg before dinner - Glipizide 2.5 mg daily before breakfast.  Please let me know if the sugars are consistently <80 or >200.  Please come back for a follow-up appointment in 3 months.

## 2015-07-09 ENCOUNTER — Other Ambulatory Visit: Payer: Self-pay | Admitting: Internal Medicine

## 2015-07-13 ENCOUNTER — Ambulatory Visit: Payer: Medicare Other | Admitting: Family Medicine

## 2015-07-16 ENCOUNTER — Ambulatory Visit: Payer: Medicare Other | Admitting: Family Medicine

## 2015-07-16 DIAGNOSIS — Z5181 Encounter for therapeutic drug level monitoring: Secondary | ICD-10-CM | POA: Diagnosis not present

## 2015-07-16 LAB — PROTIME-INR
INR: 2.92 — ABNORMAL HIGH (ref <1.50)
Prothrombin Time: 30.9 seconds — ABNORMAL HIGH (ref 11.6–15.2)

## 2015-07-24 DIAGNOSIS — G894 Chronic pain syndrome: Secondary | ICD-10-CM | POA: Diagnosis not present

## 2015-07-24 DIAGNOSIS — M47816 Spondylosis without myelopathy or radiculopathy, lumbar region: Secondary | ICD-10-CM | POA: Diagnosis not present

## 2015-07-24 DIAGNOSIS — M150 Primary generalized (osteo)arthritis: Secondary | ICD-10-CM | POA: Diagnosis not present

## 2015-07-24 DIAGNOSIS — Z79891 Long term (current) use of opiate analgesic: Secondary | ICD-10-CM | POA: Diagnosis not present

## 2015-07-26 ENCOUNTER — Other Ambulatory Visit: Payer: Self-pay | Admitting: Internal Medicine

## 2015-07-30 ENCOUNTER — Other Ambulatory Visit: Payer: Self-pay | Admitting: Internal Medicine

## 2015-08-03 ENCOUNTER — Other Ambulatory Visit: Payer: Self-pay | Admitting: Internal Medicine

## 2015-08-04 ENCOUNTER — Encounter: Payer: Self-pay | Admitting: Family Medicine

## 2015-08-04 ENCOUNTER — Other Ambulatory Visit: Payer: Self-pay | Admitting: Family Medicine

## 2015-08-05 MED ORDER — ATORVASTATIN CALCIUM 40 MG PO TABS: 40.0000 mg | ORAL_TABLET | Freq: Every day | ORAL | Status: DC

## 2015-08-17 ENCOUNTER — Ambulatory Visit: Payer: Medicare Other | Admitting: Family Medicine

## 2015-08-17 DIAGNOSIS — L564 Polymorphous light eruption: Secondary | ICD-10-CM | POA: Diagnosis not present

## 2015-08-17 DIAGNOSIS — L821 Other seborrheic keratosis: Secondary | ICD-10-CM | POA: Diagnosis not present

## 2015-08-17 DIAGNOSIS — D2261 Melanocytic nevi of right upper limb, including shoulder: Secondary | ICD-10-CM | POA: Diagnosis not present

## 2015-08-20 ENCOUNTER — Other Ambulatory Visit: Payer: Self-pay | Admitting: Family Medicine

## 2015-08-21 DIAGNOSIS — G894 Chronic pain syndrome: Secondary | ICD-10-CM | POA: Diagnosis not present

## 2015-08-21 DIAGNOSIS — Z79891 Long term (current) use of opiate analgesic: Secondary | ICD-10-CM | POA: Diagnosis not present

## 2015-08-21 DIAGNOSIS — M150 Primary generalized (osteo)arthritis: Secondary | ICD-10-CM | POA: Diagnosis not present

## 2015-08-21 DIAGNOSIS — M47816 Spondylosis without myelopathy or radiculopathy, lumbar region: Secondary | ICD-10-CM | POA: Diagnosis not present

## 2015-09-01 ENCOUNTER — Ambulatory Visit (INDEPENDENT_AMBULATORY_CARE_PROVIDER_SITE_OTHER): Payer: Medicare Other | Admitting: Family Medicine

## 2015-09-01 ENCOUNTER — Encounter: Payer: Self-pay | Admitting: Family Medicine

## 2015-09-01 VITALS — BP 110/70 | HR 92 | Ht 61.0 in | Wt 218.0 lb

## 2015-09-01 DIAGNOSIS — E1159 Type 2 diabetes mellitus with other circulatory complications: Secondary | ICD-10-CM | POA: Diagnosis not present

## 2015-09-01 DIAGNOSIS — Z1159 Encounter for screening for other viral diseases: Secondary | ICD-10-CM | POA: Diagnosis not present

## 2015-09-01 DIAGNOSIS — E785 Hyperlipidemia, unspecified: Secondary | ICD-10-CM

## 2015-09-01 DIAGNOSIS — Z7901 Long term (current) use of anticoagulants: Secondary | ICD-10-CM

## 2015-09-01 DIAGNOSIS — R7989 Other specified abnormal findings of blood chemistry: Secondary | ICD-10-CM

## 2015-09-01 DIAGNOSIS — Z5181 Encounter for therapeutic drug level monitoring: Secondary | ICD-10-CM | POA: Diagnosis not present

## 2015-09-01 DIAGNOSIS — I10 Essential (primary) hypertension: Secondary | ICD-10-CM

## 2015-09-01 DIAGNOSIS — M545 Low back pain, unspecified: Secondary | ICD-10-CM

## 2015-09-01 DIAGNOSIS — E1151 Type 2 diabetes mellitus with diabetic peripheral angiopathy without gangrene: Secondary | ICD-10-CM | POA: Diagnosis not present

## 2015-09-01 DIAGNOSIS — M25561 Pain in right knee: Secondary | ICD-10-CM

## 2015-09-01 DIAGNOSIS — E1169 Type 2 diabetes mellitus with other specified complication: Secondary | ICD-10-CM | POA: Diagnosis not present

## 2015-09-01 DIAGNOSIS — I480 Paroxysmal atrial fibrillation: Secondary | ICD-10-CM

## 2015-09-01 DIAGNOSIS — Z8673 Personal history of transient ischemic attack (TIA), and cerebral infarction without residual deficits: Secondary | ICD-10-CM

## 2015-09-01 DIAGNOSIS — F418 Other specified anxiety disorders: Secondary | ICD-10-CM | POA: Diagnosis not present

## 2015-09-01 LAB — CBC WITH DIFFERENTIAL/PLATELET
BASOS ABS: 51 {cells}/uL (ref 0–200)
BASOS PCT: 1 %
EOS ABS: 102 {cells}/uL (ref 15–500)
Eosinophils Relative: 2 %
HEMATOCRIT: 40.9 % (ref 35.0–45.0)
Hemoglobin: 13.8 g/dL (ref 11.7–15.5)
LYMPHS PCT: 47 %
Lymphs Abs: 2397 cells/uL (ref 850–3900)
MCH: 31.4 pg (ref 27.0–33.0)
MCHC: 33.7 g/dL (ref 32.0–36.0)
MCV: 93.2 fL (ref 80.0–100.0)
MONO ABS: 357 {cells}/uL (ref 200–950)
MONOS PCT: 7 %
MPV: 9.7 fL (ref 7.5–12.5)
NEUTROS PCT: 43 %
Neutro Abs: 2193 cells/uL (ref 1500–7800)
PLATELETS: 292 10*3/uL (ref 140–400)
RBC: 4.39 MIL/uL (ref 3.80–5.10)
RDW: 15 % (ref 11.0–15.0)
WBC: 5.1 10*3/uL (ref 4.0–10.5)

## 2015-09-01 LAB — PROTIME-INR
INR: 2.9 — ABNORMAL HIGH
Prothrombin Time: 29.4 s — ABNORMAL HIGH (ref 9.0–11.5)

## 2015-09-01 LAB — TSH: TSH: 1.85 mIU/L

## 2015-09-01 NOTE — Progress Notes (Signed)
Subjective:   HPI  Kristen [name] is a 70 y.o. female who presents for a complete physical.She has a very complicated history dealing with 3 back surgeries and chronic back pain as well as difficulty with arthritis especially in the right knee. She is involved in pain management with Dr. Hardin Negus. She has underlying cardiac disease including PAF and presently is on Coumadin. She is followed by Dr. Burt Knack for her underlying cardiac history. She is followed psychologically by Dr. Jerene Bears. She is seen by endocrine for her underlying diabetes. There is also questionable history of abnormal TSH in the past but she is not presently on any medications. She has had difficulty recently with sleep issues but she does sleep 3 or 4 hours to get up and will lay back down again.She also has a history of CVA but presently is having no weakness, numbness or decreased function. Her medications were reviewed as well as her immunizations and health maintenance. Medical care team includes:  Mark phillips pain.  Sherren Mocha Cardio.  [facility] DM   Preventative care: Last ophthalmology visit:03/2015 Bing Plume Last dental visit:?  Last colonoscopy:03/20/12 Last mammogram: 12/02/14 Last gynecological exam: Last EKG:05/06/15 Last labs:08/29/14  Prior vaccinations: TD or Tdap:03/07/2005 Influenza:11/12/14 Pneumococcal:23: 12/14/10 13: 08/09/13 Shingles/Zostavax: Other:   Advanced directive: Information given     Reviewed their medical, surgical, family, social, medication, and allergy history and updated chart as appropriate.    Review of Systems Constitutional: -fever, -chills, -sweats, -unexpected weight change, -decreased appetite, -fatigue Allergy: -sneezing, -itching, -congestion Dermatology: -changing moles, --rash, -lumps ENT: -runny nose, -ear pain, -sore throat, -hoarseness, -sinus pain, -teeth pain, - ringing in ears, -hearing loss, -nosebleeds Cardiology: -chest pain,  -palpitations, -swelling, -difficulty breathing when lying flat, -waking up short of breath Respiratory: -cough, -shortness of breath, -difficulty breathing with exercise or exertion, -wheezing, -coughing up blood Gastroenterology: -abdominal pain, -nausea, -vomiting, -diarrhea, -constipation, -blood in stool, -changes in bowel movement, -difficulty swallowing or eating Hematology: -bleeding, -bruising  Musculoskeletal: -joint aches, -muscle aches, -joint swelling, -back pain, -neck pain, -cramping, -changes in gait Ophthalmology: denies vision changes, eye redness, itching, discharge Urology: -burning with urination, -difficulty urinating, -blood in urine, -urinary frequency, -urgency, -incontinence Neurology: -headache, -weakness, -tingling, -numbness, -memory loss, -falls, -dizziness Psychology: -depressed mood, -agitation, -sleep problems     Objective:   Physical Exam  Filed Vitals:   09/01/15 1400  BP: 110/70  Pulse: 92    General appearance: alert, no distress, WD/WN,  Skin:  HEENT: normocephalic, conjunctiva/corneas normal, sclerae anicteric, PERRLA, EOMi, nares patent, no discharge or erythema, pharynx normal Oral cavity: MMM, tongue normal, teeth normal Neck: supple, no lymphadenopathy, no thyromegaly, no masses, normal ROM Chest: non tender, normal shape and expansion Heart: RRR, normal S1, S2, no murmurs Lungs: CTA bilaterally, no wheezes, rhonchi, or rales Abdomen: +bs, soft, non tender, non distended, no masses, no hepatomegaly, no splenomegaly, no bruits Back: non tender, normal ROM, no scoliosis Musculoskeletal: upper extremities non tender, no obvious deformity, normal ROM throughout, lower extremities non tender, no obvious deformity, normal ROM throughout Extremities: no edema, no cyanosis, no clubbing Pulses: 2+ symmetric, upper and lower extremities, normal cap refill Neurological: alert, oriented x 3, CN2-12 intact, strength normal upper extremities and lower  extremities, sensation normal throughout, DTRs 2+ throughout, no cerebellar signs, gait normal Psychiatric: Flat affect behavior normal, pleasant    Assessment and Plan :   DM (diabetes mellitus), type 2 with peripheral vascular complications ([place]) - Plan: CBC with Differential/Platelet, Comprehensive metabolic  panel, Lipid panel  Encounter for therapeutic drug monitoring  Long term (current) use of anticoagulants - Plan: Protime-INR, Protime-INR  Right knee pain  H/O: CVA (cerebrovascular accident)  Paroxysmal atrial fibrillation ([place]) - Plan: Protime-INR  Low back pain, non-specific  Need for hepatitis C screening test - Plan: Hepatitis C antibody  Abnormal TSH - Plan: TSH  Hyperlipidemia associated with type 2 diabetes mellitus ([place]) - Plan: Lipid panel  Depression with anxiety  Hypertension associated with diabetes [facility]) She sees multiple doctors for her care. She will continue to be followed by them. That she take no more as she does take a nap in the mid morning. Also recommended she get Zostavax and TDaP at the drugstore. She will continue on her present medications. Encouraged her to become as active as she possibly can.     Physical exam - discussed healthy lifestyle, diet, exercise, preventative care, vaccinations, and addressed their concerns.   Follow-up  4 months

## 2015-09-02 LAB — COMPREHENSIVE METABOLIC PANEL
ALK PHOS: 64 U/L (ref 33–130)
ALT: 15 U/L (ref 6–29)
AST: 15 U/L (ref 10–35)
Albumin: 4.6 g/dL (ref 3.6–5.1)
BUN: 16 mg/dL (ref 7–25)
CALCIUM: 9.9 mg/dL (ref 8.6–10.4)
CHLORIDE: 105 mmol/L (ref 98–110)
CO2: 20 mmol/L (ref 20–31)
Creat: 0.8 mg/dL (ref 0.50–0.99)
GLUCOSE: 115 mg/dL — AB (ref 65–99)
POTASSIUM: 4.3 mmol/L (ref 3.5–5.3)
Sodium: 140 mmol/L (ref 135–146)
Total Bilirubin: 0.6 mg/dL (ref 0.2–1.2)
Total Protein: 7.4 g/dL (ref 6.1–8.1)

## 2015-09-02 LAB — LIPID PANEL
CHOL/HDL RATIO: 2.7 ratio (ref <=5.0)
Cholesterol: 100 mg/dL — ABNORMAL LOW (ref 125–200)
HDL: 37 mg/dL — AB (ref >=46)
LDL Cholesterol: 42 mg/dL (ref <130)
Triglycerides: 104 mg/dL (ref <150)
VLDL: 21 mg/dL (ref <30)

## 2015-09-02 LAB — HEPATITIS C ANTIBODY: HCV AB: NEGATIVE

## 2015-09-06 ENCOUNTER — Other Ambulatory Visit: Payer: Self-pay | Admitting: Internal Medicine

## 2015-09-18 DIAGNOSIS — Z79891 Long term (current) use of opiate analgesic: Secondary | ICD-10-CM | POA: Diagnosis not present

## 2015-09-18 DIAGNOSIS — M150 Primary generalized (osteo)arthritis: Secondary | ICD-10-CM | POA: Diagnosis not present

## 2015-09-18 DIAGNOSIS — G894 Chronic pain syndrome: Secondary | ICD-10-CM | POA: Diagnosis not present

## 2015-09-18 DIAGNOSIS — M47816 Spondylosis without myelopathy or radiculopathy, lumbar region: Secondary | ICD-10-CM | POA: Diagnosis not present

## 2015-09-25 ENCOUNTER — Encounter (HOSPITAL_COMMUNITY): Payer: Self-pay | Admitting: Psychiatry

## 2015-09-25 ENCOUNTER — Ambulatory Visit (INDEPENDENT_AMBULATORY_CARE_PROVIDER_SITE_OTHER): Payer: Medicare Other | Admitting: Psychiatry

## 2015-09-25 VITALS — BP 128/72 | HR 98 | Ht 61.0 in | Wt 219.0 lb

## 2015-09-25 DIAGNOSIS — F4322 Adjustment disorder with anxiety: Secondary | ICD-10-CM | POA: Diagnosis not present

## 2015-09-25 MED ORDER — ALPRAZOLAM 1 MG PO TABS: 1.0000 mg | ORAL_TABLET | Freq: Three times a day (TID) | ORAL | Status: DC | PRN

## 2015-09-25 NOTE — Progress Notes (Signed)
Patient ID: Cason Falb, female   DOB: [birth year], 70 y.o.   MRN: [id number] [facility] MD Progress Note  09/25/2015 11:02 AM Nyeesha Bendick  MRN:  [id number] Subjective:  Scared Principal Problem: Adjustment disorder with anxious mood state Diagnosis:  Adjustment disorder with anxious mood state Today the patient is seen one time. He actually is doing well. She was last seen in December 2016 and there some confusion about her appointment. Because she had refills she chose not to be seen. She is now out of her refills. This patient uses her medicine very appropriately. She has no history of drug or alcohol use. She takes her medicines as prescribed. I do believe there is simply a misunderstanding about her Xanax. She's been on her Xanax 1 mg 3 times a day for over 2 decades. Her primary care doctor started her on it and for a number times try to get her off of it could not. She failed Klonopin and other agents. At this time the patient is not oversedated. She's not had any falls. She is chronic knee pain and her surgeon said they like to operate on her knees but she needs to lose weight first. The patient is a very sensitive individual. She has emotional reactions only anniversaries and birthdays of her 2 sisters who are deceased and her grandson who also she raised and is deceased. The patient has been in therapy before but not now. She is no specific being issue at this time. Her husband and her are separated but they may contact and eating comes over and spends a few nights now. She has 2 sons who are doing okay. The patient denies daily depression. She is sleeping and eating very well. She denies worthlessness. She is concentrating well. She loves reading watching TV. She loves music. She goes to church. She shows no evidence of psychosis. I do not believe she shows specific symptoms consistent with panic disorder or obsessive-compulsive disorder. At this time her anxiety is actually very well  controlled taking Xanax on a fixed regular basis. At this time it's hard to imagine good justification for taking her off of Xanax. Her mood is stable. Patient Active Problem List   Diagnosis Date Noted  . Snoring [R06.83] 11/26/2014  . LV dysfunction [I51.9]   . Encounter for therapeutic drug monitoring [Z51.81] 05/03/2013  . Angina decubitus ([place]) [I20.8] 12/07/2012  . DM (diabetes mellitus), type 2 with peripheral vascular complications ([place]) 123456 12/01/2012  . PVC's/nonsustained VT [I49.3] 10/24/2012  . Morbid obesity with BMI of 40.0-44.9, adult ([place]) [E66.01, Z68.41] 03/15/2012  . H/O: CVA (cerebrovascular accident) [Z86.73] 02/09/2012  . Chest pain, musculoskeletal [R07.89] 07/20/2011  . Hypothyroidism [E03.9] 05/27/2011  . Fibromyalgia [M79.7] 10/08/2010  . Long term current use of anticoagulant [Z79.01] 05/05/2010  . Brachial neuritis or radiculitis [M54.12] 10/30/2009  . Low back pain, non-specific [M54.5] 05/12/2008  . LBBB [I44.7] 03/19/2008  . Chronic lymphadenitis [I88.1] 07/23/2007  . Depression with anxiety [F41.8] 01/29/2007  . Osteoarthritis [M19.90] 11/22/2006  . BURSITIS, ACROMIOCLAVICULAR, LEFT [M71.9, C8717557 10/03/2006  . Hyperlipidemia associated with type 2 diabetes mellitus ([place]) [E11.69, E78.5] 09/13/2006  . Coronary atherosclerosis [I25.10] 09/13/2006  . Paroxysmal atrial fibrillation ([place]) [I48.0] 09/13/2006  . GERD [K21.9] 09/13/2006  . Hypertension associated with diabetes ([place]) [E11.59, I10] 08/30/2006   Total Time spent with patient: 30 minutes  Past Psychiatric History:   Past Medical History:  Past Medical History  Diagnosis Date  . CAD (coronary artery disease)  a. s/p CABG 2004.b. stable cath 2014 demonstrating stable CAD and continued patency of her LIMA graft.  Marland Kitchen PAF (paroxysmal atrial fibrillation) ([place])   . Hypertension   . Hyperlipidemia   . Hypokalemia   . Diabetes mellitus   . Depression   . Lumbar back pain   . LBBB  (left bundle branch block)   . Headache(784.0)   . Lymphadenitis   . Arthritis   . Bursitis   . Chronic anticoagulation     on coumadin  . Diastolic dysfunction     per echo in October 2012 with EF 50 to 55%  . Stroke [facility]) 2004    affected speech per pt  . GERD (gastroesophageal reflux disease) 10/23/2003  . Fibromyalgia   . Blood transfusion without reported diagnosis   . LV dysfunction     a. EF 45% by cath 2014. b. EF 50-55% by technically difficult echo in 08/2014.  . Morbid obesity ([place])     a. Sleep study negative for significant OSA in 11/2014.    Past Surgical History  Procedure Laterality Date  . Angioplasty  laminectomy  . Lumbar laminectomy      x3  . Coronary artery bypass graft      LIMA to LAD   . Abdominal hysterectomy    . Cholecystectomy    . Tonsillectomy    . Knee arthroscopy    . Sphincterotomy    . Left heart catheterization with coronary/graft angiogram  12/07/2012    Procedure: LEFT HEART CATHETERIZATION WITH Beatrix Fetters;  Surgeon: Blane Ohara, MD;  Location: [facility] CATH LAB;  Service: Cardiovascular;;   Family History:  Family History  Problem Relation Age of Onset  . Heart attack Father   . Heart disease Father   . Kidney disease    . Stroke    . Arthritis    . Hypertension    . Diabetes    . Stroke Mother   . Colon cancer Paternal Uncle    Family Psychiatric  History:  Social History:  History  Alcohol Use No     History  Drug Use No    Social History   Social History  . Marital Status: Legally Separated    Spouse Name: N/A  . Number of Children: N/A  . Years of Education: N/A   Social History Main Topics  . Smoking status: Former Smoker    Quit date: 03/07/2001  . Smokeless tobacco: Never Used  . Alcohol Use: No  . Drug Use: No  . Sexual Activity: Not Asked   Other Topics Concern  . None   Social History Narrative   Lives locally, has help available if needed.   Additional Social History:                          Sleep: Fair  Appetite:  Fair  Current Medications: Current Outpatient Prescriptions  Medication Sig Dispense Refill  . ALPRAZolam (XANAX) 1 MG tablet Take 1 tablet (1 mg total) by mouth 3 (three) times daily as needed for anxiety. 90 tablet 4  . atenolol (TENORMIN) 100 MG tablet 1 tablet in the morning and 0.5 tablet in the evening. 45 tablet 8  . atorvastatin (LIPITOR) 40 MG tablet TAKE 1 TABLET BY MOUTH EVERY DAY 90 tablet 0  . clotrimazole-betamethasone (LOTRISONE) cream Apply 1 application topically daily as needed (for itching). Reported on 05/08/2015    . fluocinonide cream (LIDEX) [id number] % Apply 1 application  topically as needed (For itching). Reported on 05/08/2015    . furosemide (LASIX) 20 MG tablet TAKE 1 TABLET BY MOUTH DAILY 90 tablet 1  . gabapentin (NEURONTIN) 300 MG capsule TAKE ONE CAPSULE BY MOUTH THREE TIMES DAILY 270 capsule 1  . gabapentin (NEURONTIN) 300 MG capsule TAKE ONE CAPSULE BY MOUTH THREE TIMES DAILY 90 capsule 3  . glipiZIDE (GLUCOTROL) 5 MG tablet TAKE 1/2 TABLET BY MOUTH DAILY BEFORE BREAKFAST AND 1 TABLET BEFORE DINNER 45 tablet 5  . glucose blood (ONE TOUCH ULTRA TEST) test strip USE TO TEST BLOOD SUGAR 4 TIMES DAILY. DX: E11.51 250 each 2  . HYDROcodone-acetaminophen (NORCO) 10-325 MG per tablet Take 1 tablet by mouth every 4 (four) hours as needed (pain). 150 tablet 0  . hydrocortisone 2.5 % ointment Apply 1 application topically 2 (two) times daily.   3  . Liraglutide (VICTOZA) 18 MG/3ML SOPN Inject 1.8 mg into the skin daily.    . Magnesium Oxide 400 MG CAPS Take 1 capsule (400 mg total) by mouth 2 (two) times daily. 60 capsule 11  . metFORMIN (GLUCOPHAGE-XR) 500 MG 24 hr tablet TAKE 2 TABLETS BY MOUTH TWICE DAILY WITH A MEAL. 120 tablet 3  . nitroGLYCERIN (NITROSTAT) 0.4 MG SL tablet Place 0.4 mg under the tongue every 5 (five) minutes as needed for chest pain (x 3 doses). Reported on 05/08/2015    . potassium chloride SA  (K-DUR,KLOR-CON) 20 MEQ tablet TAKE 1 TABLET BY MOUTH TWICE DAILY 60 tablet 11  . spironolactone (ALDACTONE) 25 MG tablet Take 1 tablet (25 mg total) by mouth 2 (two) times daily. 180 tablet 3  . telmisartan (MICARDIS) 40 MG tablet TAKE 1 TABLET BY MOUTH DAILY 90 tablet 3  . warfarin (COUMADIN) 6 MG tablet TAKE BY MOUTH AS DIRECTED BY ANTICOAGULATION CLINIC (Patient taking differently: TAKE BY MOUTH AS DIRECTED BY ANTICOAGULATION CLINIC 6mg  mon,wed,fri 3mg  tues,thur,sat,sun) 135 tablet 0   No current facility-administered medications for this visit.    Lab Results: No results found. However, due to the size of the patient record, not all encounters were searched. Please check Results Review for a complete set of results.  Physical Findings: AIMS:  , ,  ,  ,    CIWA:    COWS:     Musculoskeletal: Strength & Muscle Tone: within normal limits Gait & Station: normal Patient leans: Right  Psychiatric Specialty Exam: ROS  Blood pressure 128/72, pulse 98, height 5\' 1"  (1.549 m), weight 219 lb (99.338 kg).Body mass index is 41.4 kg/(m^2).  General Appearance: NA  Eye Contact::  Good  Speech:  Clear and Coherent  Volume:  Normal  Mood:  NA  Affect:  Congruent  Thought Process:  Coherent  Orientation:  Full (Time, Place, and Person)  Thought Content:  WDL  Suicidal Thoughts:  No  Homicidal Thoughts:  No  Memory:  NA  Judgement:  Good  Insight:  Good  Psychomotor Activity:  Normal  Concentration:  Good  Recall:  Good  Fund of Knowledge:Fair  Language: Good  Akathisia:  No  Handed:  Left  AIMS (if indicated):     Assets:  Desire for Improvement  ADL's:  Intact  Cognition: WNL  Sleep:      Treatment Plan Summary:  At this time the patient is doing well. She is stable. She's had no falls. She actually seems like she is walking better. She does not use a cane or walker. She denies consistent anxiety. She denies daily depression.  She takes Xanax 1 mg 3 times a day no other  psychotropic medicines for I will have this patient come back to see me in 3 months. Then will be a 30 minute visit and again will re-examined presence or absence of any anxiety disorder. I suspect will keep her on Xanax indefinitely. When she comes back and she is stable I will see her every 4 months for a 15 minute visit. Patient is not suicidal. She denies chest pain or shortness of breath. She denies any neurological symptoms at this time.  Charlestine Night Ilias Stcharles 09/25/2015, 11:02 AM

## 2015-10-05 ENCOUNTER — Other Ambulatory Visit: Payer: Self-pay | Admitting: Internal Medicine

## 2015-10-07 ENCOUNTER — Encounter: Payer: Self-pay | Admitting: Internal Medicine

## 2015-10-12 ENCOUNTER — Ambulatory Visit (INDEPENDENT_AMBULATORY_CARE_PROVIDER_SITE_OTHER): Payer: Medicare Other | Admitting: Internal Medicine

## 2015-10-12 ENCOUNTER — Encounter: Payer: Self-pay | Admitting: Internal Medicine

## 2015-10-12 VITALS — BP 120/84 | HR 90 | Ht 61.5 in | Wt 217.0 lb

## 2015-10-12 DIAGNOSIS — E1151 Type 2 diabetes mellitus with diabetic peripheral angiopathy without gangrene: Secondary | ICD-10-CM

## 2015-10-12 LAB — POCT GLYCOSYLATED HEMOGLOBIN (HGB A1C): HEMOGLOBIN A1C: 7.5

## 2015-10-12 MED ORDER — GLIPIZIDE 5 MG PO TABS: ORAL_TABLET | ORAL | 5 refills | Status: DC

## 2015-10-12 NOTE — Addendum Note (Signed)
Addended by: Caprice Beaver T on: 10/12/2015 04:35 PM   Modules accepted: Orders

## 2015-10-12 NOTE — Progress Notes (Signed)
Patient ID: Kristen [name], female   DOB: 1946/02/27, 70 y.o.   MRN: [id number]  HPI: Kristen [name] is a 70 y.o.-year-old female, returning for DM2, dx 2008, insulin-dependent, uncontrolled, with complications (CAD, PAD, h/o CVD, peripheral neuropathy). Last visit 3 mo ago.  She has a tooth infection now + gingivitis.  Last hemoglobin A1c was: Lab Results  Component Value Date   HGBA1C 7.1 07/07/2015   HGBA1C 6.9 04/09/2015   HGBA1C 6.9 12/30/2014  She had a steroid inj last month.  Pt is on a regimen of: - Victoza 1.8 mg in am - Metformin XR 1000 mg 2x a day - Glipizide 5 mg before dinner - Glipizide 2.5 mg daily before breakfast. Stopped Lantus 20 units in 12/2014. We tried NPH. She was on Levemir pen 30 units 2x a day - gets the med directly from [facility] - but had to stop b/c of price. She is now Metformin XR 1000 mg bid b/c N/D and palpitations with regular Metformin - retried with the same results She was on Bydureon in the past, then also on Novolog 60-70-75.  She tried Januvia before.   Pt checked her sugars 2-3x a day - no log >> higher sugars: - am: 90-120 >> 115-210 >> 89, 112-147, 187 >> 90-126 >> 90-140 >> 87, 109-143 >> 120-130s >> 190-224 - 2h after b'fast: 96-140 >> upper 200s >> 135-165, 202 >> 95-155, 161 >> n/c >> 117, 156 >> n/c - before lunch: 180-200 >> 120-148, 194 >> 93-135 >> 129 >> 104-172, 197, 215 >> 150-160s >> n/c - 2h after lunch: 111-204 >> 110-169 >> 121, 236 >> 107-199>> n/c - before dinner: 187-210 >> 101-177, 214 >> 93-147, 175 >> 121-157 >> 114-160, 180, 196 >> n/c >> 170-240 - after dinner: 108, 136-180, 230 >> 89-170, 192 >> 150-195, 263 >> 123-203  >> 160-180 >> 348 - bedtime: 130s >> see above >> 112-168, 208 >> 69-168 >> 96-187 >> 106-184 >> see above - nighttime: 56, 71-145 >> n/c No recent lows;  she has hypoglycemia awareness at 100 >> 87 >> 87 >> 100.  Highest: 300s - steroid >> 314 x1 (stressed) >> 345x1 >> 348  x5.  Pt does not have chronic kidney disease, last BUN/creatinine was:  Lab Results  Component Value Date   BUN 16 09/01/2015   CREATININE 0.80 09/01/2015  On Telmisartan. Last set of lipids: Lab Results  Component Value Date   CHOL 100 (L) 09/01/2015   HDL 37 (L) 09/01/2015   LDLCALC 42 09/01/2015   LDLDIRECT 116.0 11/28/2008   TRIG 104 09/01/2015   CHOLHDL 2.7 09/01/2015  On Lipitor. Pt's last eye exam was in 02/2015. Dr. Bing Plume. + hypertensive retinopathy.  Denies numbness and tingling in her legs. She had a foot exam in 04/03/2015.  She also has a history of hypertension, paroxysmal A. Fib-on anticoagulation, depression, osteoarthritis - status post arthroscopic knee surgeries x2, status post lumbar laminectomy x3, hyperlipidemia, morbid obesity, chronic lymphadenitis, GERD, hypothyroidism, headaches, fibromyalgia.  ROS: Constitutional: + fatigue, no weight gain, + hot flashes, no nocturia Eyes: + blurry vision, no xerophthalmia ENT: no sore throat, no nodules palpated in throat, no dysphagia/odynophagia, no hoarseness Cardiovascular: no CP/+ palpitations/+ SOB/+ leg swelling Respiratory: no cough/+ SOB with activity  Gastrointestinal: no vomiting/+ occas. Diarrhea/no constipation Musculoskeletal: + muscle aches/+ joint aches Skin: no rashes, + hair loss Neurological: no tremors/numbness/tingling/dizziness, + headaches  I reviewed pt's medications, allergies, PMH, social hx, family hx, and changes were documented in  the history of present illness. Otherwise, unchanged from my initial visit note.   PE: BP 120/84 (BP Location: Right Arm, Patient Position: Sitting)   Pulse 90   Ht 5' 1.5" (1.562 m)   Wt 217 lb (98.4 kg)   SpO2 95%   BMI 40.34 kg/m  Body mass index is 40.34 kg/m. Wt Readings from Last 3 Encounters:  10/12/15 217 lb (98.4 kg)  09/01/15 218 lb (98.9 kg)  07/07/15 218 lb 12.8 oz (99.2 kg)   Constitutional: overweight, in NAD, full supraclavicular fat  pads Eyes: PERRLA, EOMI, no exophthalmos ENT: moist mucous membranes, no thyromegaly, no cervical lymphadenopathy Cardiovascular: tachycardia, RR, No MRG Respiratory: CTA B Gastrointestinal: abdomen soft, NT, ND, BS+ Musculoskeletal: no deformities, strength intact in all 4 Skin: moist, warm, acanthosis nigricans  ASSESSMENT: 1. DM2, insulin-dependent, uncontrolled, with complications - CAD- Echo 12/2010: EF 50-55%, status post CABG 2004: LIMA to LAD - cards: Dr. Lia Foyer. Also has a LBBB.  - PAD - h/o stroke - peripheral neuropathy - on neurontin  PLAN:  1. Patient with a several-year history of diabetes, previusously on basal-bolus regimen, then off all insulin with great control! Now sugars are much higher >> gingivitis and tooth infection >> will try to see PCP to get an ABx in preparation for the extractions. - she has insulin at [facility], but for now will try to increase Glipizide >> will let me know about sugars in 1 week - I advised her to Patient Instructions  Please continue: - Victoza 1.8 mg in am - Metformin XR 1000 mg 2x a day  Please increase:  - Glipizide 10 mg before dinner - Glipizide 5 mg daily before breakfast.  Please let me know about the sugars in 1 week.  Please return in 1.5 months with your sugar log.   - up to date with eye exams - given more sugar logs - will check HbA1c today >> 7.5% (higher) - RTC in 1.5 mo

## 2015-10-12 NOTE — Patient Instructions (Addendum)
Please continue: - Victoza 1.8 mg in am - Metformin XR 1000 mg 2x a day  Please increase:  - Glipizide 10 mg before dinner - Glipizide 5 mg daily before breakfast.  Please let me know about the sugars in 1 week.  Please return in 1.5 months with your sugar log.

## 2015-10-16 DIAGNOSIS — Z79891 Long term (current) use of opiate analgesic: Secondary | ICD-10-CM | POA: Diagnosis not present

## 2015-10-16 DIAGNOSIS — M47816 Spondylosis without myelopathy or radiculopathy, lumbar region: Secondary | ICD-10-CM | POA: Diagnosis not present

## 2015-10-16 DIAGNOSIS — M150 Primary generalized (osteo)arthritis: Secondary | ICD-10-CM | POA: Diagnosis not present

## 2015-10-16 DIAGNOSIS — G894 Chronic pain syndrome: Secondary | ICD-10-CM | POA: Diagnosis not present

## 2015-10-26 NOTE — Telephone Encounter (Signed)
New prescription for glipizide send to  [place], [place] [phone] (Phone) [phone] (Fax)

## 2015-10-28 ENCOUNTER — Encounter: Payer: Self-pay | Admitting: Internal Medicine

## 2015-10-28 ENCOUNTER — Telehealth: Payer: Self-pay | Admitting: Internal Medicine

## 2015-10-28 MED ORDER — GLIPIZIDE 5 MG PO TABS: ORAL_TABLET | ORAL | 5 refills | Status: DC

## 2015-10-28 NOTE — Telephone Encounter (Signed)
PT needs her new, changed dosage of Glipizide sent to [place] at [facility].  She is completely out.

## 2015-10-28 NOTE — Telephone Encounter (Signed)
Refill re-sent. Message sent to pt via my chart as I was unable to reach her by phone.

## 2015-10-29 ENCOUNTER — Other Ambulatory Visit: Payer: Self-pay | Admitting: Family Medicine

## 2015-10-29 DIAGNOSIS — Z7901 Long term (current) use of anticoagulants: Principal | ICD-10-CM

## 2015-10-29 DIAGNOSIS — Z5181 Encounter for therapeutic drug level monitoring: Secondary | ICD-10-CM

## 2015-10-30 ENCOUNTER — Other Ambulatory Visit: Payer: Self-pay | Admitting: Family Medicine

## 2015-10-30 ENCOUNTER — Other Ambulatory Visit: Payer: Medicare Other

## 2015-10-30 DIAGNOSIS — Z5181 Encounter for therapeutic drug level monitoring: Secondary | ICD-10-CM

## 2015-10-30 DIAGNOSIS — Z7901 Long term (current) use of anticoagulants: Secondary | ICD-10-CM | POA: Diagnosis not present

## 2015-10-30 LAB — PROTIME-INR
INR: 2.2 — ABNORMAL HIGH
Prothrombin Time: 22.2 s — ABNORMAL HIGH (ref 9.0–11.5)

## 2015-11-02 ENCOUNTER — Other Ambulatory Visit: Payer: Self-pay

## 2015-11-06 ENCOUNTER — Encounter: Payer: Self-pay | Admitting: Cardiovascular Disease

## 2015-11-13 DIAGNOSIS — Z79891 Long term (current) use of opiate analgesic: Secondary | ICD-10-CM | POA: Diagnosis not present

## 2015-11-13 DIAGNOSIS — G894 Chronic pain syndrome: Secondary | ICD-10-CM | POA: Diagnosis not present

## 2015-11-13 DIAGNOSIS — M150 Primary generalized (osteo)arthritis: Secondary | ICD-10-CM | POA: Diagnosis not present

## 2015-11-13 DIAGNOSIS — M47816 Spondylosis without myelopathy or radiculopathy, lumbar region: Secondary | ICD-10-CM | POA: Diagnosis not present

## 2015-11-18 ENCOUNTER — Other Ambulatory Visit: Payer: Self-pay | Admitting: Internal Medicine

## 2015-11-23 ENCOUNTER — Ambulatory Visit (INDEPENDENT_AMBULATORY_CARE_PROVIDER_SITE_OTHER): Payer: Medicare Other | Admitting: Cardiovascular Disease

## 2015-11-23 ENCOUNTER — Encounter: Payer: Self-pay | Admitting: Cardiovascular Disease

## 2015-11-23 VITALS — BP 110/70 | HR 96 | Ht 61.5 in | Wt 220.0 lb

## 2015-11-23 DIAGNOSIS — I480 Paroxysmal atrial fibrillation: Secondary | ICD-10-CM | POA: Diagnosis not present

## 2015-11-23 DIAGNOSIS — I10 Essential (primary) hypertension: Secondary | ICD-10-CM | POA: Diagnosis not present

## 2015-11-23 DIAGNOSIS — E785 Hyperlipidemia, unspecified: Secondary | ICD-10-CM

## 2015-11-23 DIAGNOSIS — I2510 Atherosclerotic heart disease of native coronary artery without angina pectoris: Secondary | ICD-10-CM | POA: Diagnosis not present

## 2015-11-23 NOTE — Patient Instructions (Signed)

## 2015-11-23 NOTE — Progress Notes (Signed)
Cardiology Office Note Date:  11/23/2015   ID:  Ha, Reinking 10-30-1945, MRN [id number]  PCP:  Wyatt Haste, MD  Cardiologist:  Sherren Mocha, MD    Chief Complaint  Patient presents with  . Atrial Fibrillation     History of Present Illness: Kristen [name] is a 70 y.o. female who presents for follow-up of CAD. Medical problems include CABG 2004 (stable cath 2014 demonstrating stable CAD and continued patency of her LIMA graft), paroxysmal atrial fibrillation, history of stroke, morbid obesity, diabetes, hypertension, left bundle branch block, fibromyalgia, panic and anxiety.  She's been having a 'bad hot flash' today. Complaining of more frequent palpitations at night. An echo was last done one year ago and showed normal LV function with LVEF 50-55%. She has not had recent chest pain. She feels like many of her symptoms may be driven by anxiety. No edema, orthopnea, or PND.   Past Medical History:  Diagnosis Date  . Arthritis   . Blood transfusion without reported diagnosis   . Bursitis   . CAD (coronary artery disease)    a. s/p CABG 2004.b. stable cath 2014 demonstrating stable CAD and continued patency of her LIMA graft.  . Chronic anticoagulation    on coumadin  . Depression   . Diabetes mellitus   . Diastolic dysfunction    per echo in October 2012 with EF 50 to 55%  . Fibromyalgia   . GERD (gastroesophageal reflux disease) 10/23/2003  . Headache(784.0)   . Hyperlipidemia   . Hypertension   . Hypokalemia   . LBBB (left bundle branch block)   . Lumbar back pain   . LV dysfunction    a. EF 45% by cath 2014. b. EF 50-55% by technically difficult echo in 08/2014.  Marland Kitchen Lymphadenitis   . Morbid obesity ([place])    a. Sleep study negative for significant OSA in 11/2014.  Marland Kitchen PAF (paroxysmal atrial fibrillation) ([place])   . Stroke [facility]) 2004   affected speech per pt    Past Surgical History:  Procedure Laterality Date  . ABDOMINAL HYSTERECTOMY     . ANGIOPLASTY  laminectomy  . CHOLECYSTECTOMY    . CORONARY ARTERY BYPASS GRAFT     LIMA to LAD   . KNEE ARTHROSCOPY    . LEFT HEART CATHETERIZATION WITH CORONARY/GRAFT ANGIOGRAM  12/07/2012   Procedure: LEFT HEART CATHETERIZATION WITH Beatrix Fetters;  Surgeon: Blane Ohara, MD;  Location: [facility] CATH LAB;  Service: Cardiovascular;;  . LUMBAR LAMINECTOMY     x3  . SPHINCTEROTOMY    . TONSILLECTOMY      Current Outpatient Prescriptions  Medication Sig Dispense Refill  . ALPRAZolam (XANAX) 1 MG tablet Take 1 tablet (1 mg total) by mouth 3 (three) times daily as needed for anxiety. 90 tablet 4  . atenolol (TENORMIN) 100 MG tablet Take 100 mg by mouth every morning. Take 1/2 tablet by mouth in the afternoon    . atorvastatin (LIPITOR) 40 MG tablet TAKE 1 TABLET BY MOUTH EVERY DAY 90 tablet 0  . clotrimazole-betamethasone (LOTRISONE) cream Apply 1 application topically daily as needed (for itching). Reported on 05/08/2015    . fluocinonide cream (LIDEX) [id number] % Apply 1 application topically as needed (For itching). Reported on 05/08/2015    . furosemide (LASIX) 20 MG tablet TAKE 1 TABLET BY MOUTH DAILY 90 tablet 1  . gabapentin (NEURONTIN) 300 MG capsule TAKE ONE CAPSULE BY MOUTH THREE TIMES DAILY 270 capsule 1  . glipiZIDE (GLUCOTROL) 5  MG tablet TAKE 1 TABLET BY MOUTH DAILY BEFORE BREAKFAST AND 2 TABLETs BEFORE DINNER 90 tablet 5  . HYDROcodone-acetaminophen (NORCO) 10-325 MG per tablet Take 1 tablet by mouth every 4 (four) hours as needed (pain). 150 tablet 0  . hydrocortisone 2.5 % ointment Apply 1 application topically 2 (two) times daily.   3  . Liraglutide (VICTOZA) 18 MG/3ML SOPN Inject 1.8 mg into the skin daily.    . Magnesium Oxide 400 MG CAPS Take 1 capsule (400 mg total) by mouth 2 (two) times daily. 60 capsule 11  . metFORMIN (GLUCOPHAGE-XR) 500 MG 24 hr tablet TAKE 2 TABLETS BY MOUTH TWICE DAILY WITH MEALS 120 tablet 0  . nitroGLYCERIN (NITROSTAT) 0.4 MG SL tablet Place  0.4 mg under the tongue every 5 (five) minutes as needed for chest pain (x 3 doses). Reported on 05/08/2015    . ONE TOUCH ULTRA TEST test strip USE TO TEST BLOOD SUGAR FOUR TIMES DAILY 250 each 0  . potassium chloride SA (K-DUR,KLOR-CON) 20 MEQ tablet TAKE 1 TABLET BY MOUTH TWICE DAILY 60 tablet 11  . spironolactone (ALDACTONE) 25 MG tablet Take 1 tablet (25 mg total) by mouth 2 (two) times daily. 180 tablet 3  . telmisartan (MICARDIS) 40 MG tablet TAKE 1 TABLET BY MOUTH DAILY 90 tablet 3  . warfarin (COUMADIN) 6 MG tablet TAKE BY MOUTH AS DIRECTED BY ANTICOAGULATION CLINIC (Patient taking differently: TAKE BY MOUTH AS DIRECTED BY ANTICOAGULATION CLINIC 6mg  mon,wed,fri 3mg  tues,thur,sat,sun) 135 tablet 0   No current facility-administered medications for this visit.     Allergies:   Metformin and related; Cleocin [clindamycin hcl]; Codeine; Doxycycline hyclate; Macrolides and ketolides; Morphine; Pentazocine lactate; Vibramycin [doxycycline calcium]; Definity [perflutren lipid microsphere]; and Sulfonamide derivatives   Social History:  The patient  reports that she quit smoking about 14 years ago. She has never used smokeless tobacco. She reports that she does not drink alcohol or use drugs.   Family History:  The patient's  family history includes Colon cancer in her paternal uncle; Heart attack in her father; Heart disease in her father; Stroke in her mother.    ROS:  Please see the history of present illness. All other systems are reviewed and negative.    PHYSICAL EXAM: VS:  BP 110/70   Pulse 96   Ht 5' 1.5" (1.562 m)   Wt 220 lb (99.8 kg)   BMI 40.90 kg/m  , BMI Body mass index is 40.9 kg/m. GEN: Well nourished, well developed, in no acute distress  HEENT: normal  Neck: no JVD, no masses. No carotid bruits Cardiac: RRR without murmur or gallop                Respiratory:  clear to auscultation bilaterally, normal work of breathing GI: soft, nontender, nondistended, + BS MS: no  deformity or atrophy  Ext: no pretibial edema, pedal pulses 2+= bilaterally Skin: warm and dry, no rash Neuro:  Strength and sensation are intact Psych: euthymic mood, full affect  EKG:  EKG is ordered today. The ekg ordered today shows NSR, LBBB, occasional PVC  Recent Labs: 09/01/2015: ALT 15; BUN 16; Creat 0.80; Hemoglobin 13.8; Platelets 292; Potassium 4.3; Sodium 140; TSH 1.85   Lipid Panel     Component Value Date/Time   CHOL 100 (L) 09/01/2015 1344   TRIG 104 09/01/2015 1344   HDL 37 (L) 09/01/2015 1344   CHOLHDL 2.7 09/01/2015 1344   VLDL 21 09/01/2015 1344   LDLCALC 42 09/01/2015 1344  LDLDIRECT 116.0 11/28/2008 1122      Wt Readings from Last 3 Encounters:  11/23/15 220 lb (99.8 kg)  10/12/15 217 lb (98.4 kg)  09/01/15 218 lb (98.9 kg)     Cardiac Studies Reviewed: 2D Echo 09/02/2014: Left ventricle: The cavity size was normal. Wall thickness was normal. Incoordinate septal motion. Systolic function was normal. The estimated ejection fraction was in the range of 50% to 55%. Images were inadequate for LV wall motion assessment. The study is not technically sufficient to allow evaluation of LV diastolic function.  ------------------------------------------------------------------- Aorta: Aortic root: The aortic root was normal in size. Ascending aorta: The ascending aorta was normal in size.  ------------------------------------------------------------------- Mitral valve: Poorly visualized. Doppler:   Peak gradient (D): 6 mm Hg.  ------------------------------------------------------------------- Left atrium: Poorly visualized.  ------------------------------------------------------------------- Atrial septum: Poorly visualized.  ------------------------------------------------------------------- Right ventricle: Poorly visualized. The cavity size was normal. Wall thickness was normal. Systolic function was  normal.  ------------------------------------------------------------------- Pulmonic valve:  Poorly visualized. The valve appears to be grossly normal.  Doppler: There was no significant regurgitation.  ------------------------------------------------------------------- Tricuspid valve:  Doppler: There was no significant regurgitation.  ------------------------------------------------------------------- Pulmonary artery:  Poorly visualized. The main pulmonary artery was normal-sized.  ------------------------------------------------------------------- Right atrium: Poorly visualized.  ------------------------------------------------------------------- Pericardium: There was no pericardial effusion.  ------------------------------------------------------------------- Systemic veins: Not visualized.   Myoview Stress Test 01/07/2015:    Study Highlights     The left ventricular ejection fraction is mildly decreased (45-54%).  Nuclear stress EF: 49%.  Defect 1: There is a medium defect of moderate severity present in the apex location. This is most likely to be apical thinning.  This is a low risk study.   ASSESSMENT AND PLAN: 1.  CAD, native vessel, without angina: medications reviewed and will be continued without change. Previous cath and nuclear study results reviewed.   2. Paroxysmal atrial fibrillation: maintained on chronic oral anticoagulation without bleeding problems.   3. Chronic diastolic CHF, NYHA II: no exam evidence of volume excess. Last echo reviewed. Medications reviewed - BP controlled.  4. Essential HTN with CHF: BP well-controlled  5. Hyperlipidemia: continues on atorvastatin  Current medicines are reviewed with the patient today.  The patient does not have concerns regarding medicines.  Labs/ tests ordered today include:  No orders of the defined types were placed in this encounter.   Disposition:   FU 6 months  Signed, Sherren Mocha, MD  11/23/2015 4:22 PM    [facility] [address], [place], [place]  [address] Phone: [phone]; Fax: [phone]

## 2015-11-24 ENCOUNTER — Other Ambulatory Visit: Payer: Self-pay | Admitting: Family Medicine

## 2015-11-30 ENCOUNTER — Telehealth: Payer: Self-pay | Admitting: Internal Medicine

## 2015-11-30 ENCOUNTER — Ambulatory Visit (INDEPENDENT_AMBULATORY_CARE_PROVIDER_SITE_OTHER): Payer: Medicare Other | Admitting: Family Medicine

## 2015-11-30 ENCOUNTER — Encounter: Payer: Self-pay | Admitting: Family Medicine

## 2015-11-30 VITALS — BP 128/80 | HR 93 | Temp 97.8°F

## 2015-11-30 DIAGNOSIS — N390 Urinary tract infection, site not specified: Secondary | ICD-10-CM | POA: Diagnosis not present

## 2015-11-30 DIAGNOSIS — R82998 Other abnormal findings in urine: Secondary | ICD-10-CM

## 2015-11-30 DIAGNOSIS — R109 Unspecified abdominal pain: Secondary | ICD-10-CM

## 2015-11-30 LAB — POCT URINALYSIS DIPSTICK
Bilirubin, UA: NEGATIVE
GLUCOSE UA: NEGATIVE
KETONES UA: NEGATIVE
Nitrite, UA: NEGATIVE
Protein, UA: NEGATIVE
UROBILINOGEN UA: NEGATIVE
pH, UA: 6

## 2015-11-30 MED ORDER — METHOCARBAMOL 750 MG PO TABS: 750.0000 mg | ORAL_TABLET | Freq: Four times a day (QID) | ORAL | 0 refills | Status: DC

## 2015-11-30 MED ORDER — ONDANSETRON HCL 4 MG PO TABS: 4.0000 mg | ORAL_TABLET | Freq: Three times a day (TID) | ORAL | 0 refills | Status: DC | PRN

## 2015-11-30 NOTE — Telephone Encounter (Signed)
Pt called stating he is having throwing up yellow bile. I have sent zofran in for pt per vickie

## 2015-11-30 NOTE — Progress Notes (Signed)
Subjective:    Patient ID: Kristen [name], female    DOB: 11-06-1945, 70 y.o.   MRN: [id number]  HPI Chief Complaint  Patient presents with  . back pain    back pain- right side, flank pain, started last night. constant pain   She is here with complaints of right flank pain that started last night. Pain is described as sharp, non radiating and constant pain. States pain feels like a "pulled muscle".  Acute onset, started after she was lifting her mattress and changing her sheets. Pain has gotten progressively worse. Pain is worse with deep inspiration and some movements. Pain is improved with rest.   Denies urinary symptoms.  No loss of control of bowels or bladder. No dysuria, frequency.  Denies numbness, tingling or weakness.  Denies fever, chills, chest pain, shortness of breath, cough, abdominal pain, nausea, vomiting, diarrhea.   She has taken hydrocodone 10mg  without relief.   History of 3 lumbar surgeries with fusion. States pain does not feel like lumbar pain in past.    Past Medical History:  Diagnosis Date  . Arthritis   . Blood transfusion without reported diagnosis   . Bursitis   . CAD (coronary artery disease)    a. s/p CABG 2004.b. stable cath 2014 demonstrating stable CAD and continued patency of her LIMA graft.  . Chronic anticoagulation    on coumadin  . Depression   . Diabetes mellitus   . Diastolic dysfunction    per echo in October 2012 with EF 50 to 55%  . Fibromyalgia   . GERD (gastroesophageal reflux disease) 10/23/2003  . Headache(784.0)   . Hyperlipidemia   . Hypertension   . Hypokalemia   . LBBB (left bundle branch block)   . Lumbar back pain   . LV dysfunction    a. EF 45% by cath 2014. b. EF 50-55% by technically difficult echo in 08/2014.  Marland Kitchen Lymphadenitis   . Morbid obesity ([place])    a. Sleep study negative for significant OSA in 11/2014.  Marland Kitchen PAF (paroxysmal atrial fibrillation) ([place])   . Stroke [facility]) 2004   affected speech per pt    Reviewed allergies, medications, past medical, surgical, and social history.   Review of Systems Pertinent positives and negatives in the history of present illness.     Objective:   Physical Exam  Constitutional: She is oriented to person, place, and time. She appears well-developed and well-nourished. No distress.  Neck: Normal range of motion. Neck supple.  Cardiovascular: Normal rate, regular rhythm and normal heart sounds.   Pulmonary/Chest: Effort normal.  Abdominal: Soft.  Musculoskeletal:       Lumbar back: She exhibits decreased range of motion, tenderness, pain and spasm.       Back:  Tenderness noted to area without erythema, bruising. LE are neurovascularly intact. Negative straight leg raise.   Neurological: She is alert and oriented to person, place, and time. She has normal strength and normal reflexes. No cranial nerve deficit or sensory deficit. Gait normal.  Skin: Skin is warm and dry. No rash noted. No pallor.  Psychiatric: She has a normal mood and affect. Her speech is normal and behavior is normal. Thought content normal. Cognition and memory are normal.   BP 128/80   Pulse 93   Temp 97.8 F (36.6 C) (Oral)   Urinalysis dipstick: leukocytes 2+, trace of blood    Assessment & Plan:  Flank pain - Plan: Urinalysis Dipstick, Urine culture  Leukocytes in urine -  Plan: Urine culture  Discussed that her symptoms appear to be related to musculoskeletal etiology, possible muscle spasm vs acute cystitis. Discussed that urinalysis dipstick does show moderate amount of leuks. She denies having UTI symptoms and states she has had a UTI in past 6 months.  Symptoms do not speak to kidney stone.  Use heat to the area, take tylenol, and robaxin as needed. Advised that if she is not noticing improvement in symptoms or if she does develop urinary symptoms then she will call.  Urine culture sent.

## 2015-11-30 NOTE — Patient Instructions (Addendum)
Try using heat to the area for 15-20 minutes at a time, 2-3 times per day. You can try Tylenol and for severe pain take the muscle relaxant. Be aware that this medication can sedate you and you are already taking sedating medications. Do not drive, drink alcohol with this medication.  If you are not noticing any improvement in your pain call me. You are not having any urinary symptoms today to make me think you have a UTI. I am also sending your urine for culture. We will call you with results.

## 2015-12-02 LAB — URINE CULTURE

## 2015-12-03 ENCOUNTER — Encounter: Payer: Self-pay | Admitting: Family Medicine

## 2015-12-04 ENCOUNTER — Encounter: Payer: Self-pay | Admitting: Family Medicine

## 2015-12-04 ENCOUNTER — Ambulatory Visit
Admission: RE | Admit: 2015-12-04 | Discharge: 2015-12-04 | Disposition: A | Discharge status: Home / Self Care | Payer: Medicare Other | Source: Ambulatory Visit | Attending: Family Medicine | Admitting: Family Medicine

## 2015-12-04 ENCOUNTER — Ambulatory Visit (INDEPENDENT_AMBULATORY_CARE_PROVIDER_SITE_OTHER): Payer: Medicare Other | Admitting: Family Medicine

## 2015-12-04 ENCOUNTER — Other Ambulatory Visit: Payer: Self-pay | Admitting: Family Medicine

## 2015-12-04 ENCOUNTER — Ambulatory Visit: Payer: Self-pay | Admitting: Internal Medicine

## 2015-12-04 VITALS — BP 112/76 | HR 76 | Temp 97.9°F

## 2015-12-04 DIAGNOSIS — R1011 Right upper quadrant pain: Secondary | ICD-10-CM

## 2015-12-04 DIAGNOSIS — R109 Unspecified abdominal pain: Secondary | ICD-10-CM

## 2015-12-04 DIAGNOSIS — Z9889 Other specified postprocedural states: Secondary | ICD-10-CM

## 2015-12-04 DIAGNOSIS — Z7901 Long term (current) use of anticoagulants: Secondary | ICD-10-CM | POA: Diagnosis not present

## 2015-12-04 DIAGNOSIS — M5136 Other intervertebral disc degeneration, lumbar region: Secondary | ICD-10-CM | POA: Diagnosis not present

## 2015-12-04 LAB — PROTIME-INR
INR: 3.4 — ABNORMAL HIGH
Prothrombin Time: 34.1 s — ABNORMAL HIGH (ref 9.0–11.5)

## 2015-12-04 LAB — POCT URINALYSIS DIPSTICK
Bilirubin, UA: NEGATIVE
Blood, UA: NEGATIVE
Glucose, UA: NEGATIVE
Ketones, UA: NEGATIVE
NITRITE UA: NEGATIVE
PROTEIN UA: NEGATIVE
SPEC GRAV UA: 1.025
UROBILINOGEN UA: NEGATIVE
pH, UA: 5.5

## 2015-12-04 MED ORDER — METHOCARBAMOL 750 MG PO TABS: 750.0000 mg | ORAL_TABLET | Freq: Four times a day (QID) | ORAL | 0 refills | Status: DC

## 2015-12-04 NOTE — Progress Notes (Signed)
Subjective:    Patient ID: Kristen [name], female    DOB: January 04, 1946, 70 y.o.   MRN: [id number]  HPI Chief Complaint  Patient presents with  . sick    low back pain to side pain, nauseated, throwing up bile the other day. pain med not working   She is here with complaints of persistent right flank pain that is worse with movement and tender to palpation. Pain is sharp, intermittent and non radiating.  Pain started after lifting up her mattress 5 days ago. I saw her for this complaint 4 days ago and suspected her symptoms were related to a muscle strain at that time. Her pain is unchanged.  She has had multiple lumbar surgeries and is concerned that she may have moved some of the hardware in her low back.  Associated symptoms of nausea. She vomited 2 times on Monday and nothing since. Has taken Zofran for nausea.  States she tried the muscle relaxant and had some relief temporarily.  Hydrocodone 5mg  every 2 hours.  She is in a pain contract with Dr. Hardin Negus for chronic pain.   Denies fever, chills, headache, dizziness, neck pain, chest pain, palpitations, shortness of breath, abdominal pain, nausea, vomiting, diarrhea, urinary frequency, urgency, dysuria, hematuria. Denies saddle anesthesia. No loss of control of bowel or bladder.  Denies numbness, tingling, weakness of her extremities.   She is on chronic anticoagulation and would like to have her PT/INR checked today. Denies bleeding or bruising. No concerns with medication.   Past Medical History:  Diagnosis Date  . Arthritis   . Blood transfusion without reported diagnosis   . Bursitis   . CAD (coronary artery disease)    a. s/p CABG 2004.b. stable cath 2014 demonstrating stable CAD and continued patency of her LIMA graft.  . Chronic anticoagulation    on coumadin  . Depression   . Diabetes mellitus   . Diastolic dysfunction    per echo in October 2012 with EF 50 to 55%  . Fibromyalgia   . GERD (gastroesophageal  reflux disease) 10/23/2003  . Headache(784.0)   . Hyperlipidemia   . Hypertension   . Hypokalemia   . LBBB (left bundle branch block)   . Lumbar back pain   . LV dysfunction    a. EF 45% by cath 2014. b. EF 50-55% by technically difficult echo in 08/2014.  Marland Kitchen Lymphadenitis   . Morbid obesity ([place])    a. Sleep study negative for significant OSA in 11/2014.  Marland Kitchen PAF (paroxysmal atrial fibrillation) ([place])   . Stroke [facility]) 2004   affected speech per pt     Review of Systems Pertinent positives and negatives in the history of present illness.     Objective:   Physical Exam  Constitutional: She is oriented to person, place, and time. She appears well-developed and well-nourished. No distress.  Neck: Normal range of motion. Neck supple.  Cardiovascular: Normal rate, regular rhythm, normal heart sounds and intact distal pulses.   Pulmonary/Chest: Effort normal and breath sounds normal.  Abdominal: Soft. Normal appearance. There is no CVA tenderness.  Musculoskeletal:       Lumbar back: She exhibits decreased range of motion and tenderness. She exhibits no bony tenderness and no swelling.       Back:  Neurological: She is alert and oriented to person, place, and time. She has normal reflexes. No cranial nerve deficit. Coordination normal.  Skin: Skin is warm and dry. No rash noted. No erythema. No pallor.  Psychiatric: She has a normal mood and affect. Her behavior is normal. Judgment and thought content normal.   BP 112/76   Pulse 76   Temp 97.9 F (36.6 C) (Oral)   SpO2 98%       Assessment & Plan:  Acute right flank pain - Plan: DG Lumbar Spine Complete, POCT urinalysis dipstick  Current use of long term anticoagulation - Plan: Protime-INR, Protime-INR  History of lumbar surgery - Plan: DG Lumbar Spine Complete  Discussed possible differentials including muscle strain, kidney stone, UTI, pneumonia. She is not systemically ill. Urinalysis dipstick is negative for hematuria.  Pain is not associated with urination.  Pain with movement speaks to this being a muscle strain however due to her history of lumber laminectomy x 3 I plan to send her for an XR. She has been taking hydrocodone 5 mg but does have 10 mg hydrocodone tablets prescribed by her pain management specialist.  Plan to send her back to her orthopedist  She is due to PT/INR and will have this drawn today since she is here.  Follow up pending XR result.

## 2015-12-11 DIAGNOSIS — Z79891 Long term (current) use of opiate analgesic: Secondary | ICD-10-CM | POA: Diagnosis not present

## 2015-12-11 DIAGNOSIS — M150 Primary generalized (osteo)arthritis: Secondary | ICD-10-CM | POA: Diagnosis not present

## 2015-12-11 DIAGNOSIS — G894 Chronic pain syndrome: Secondary | ICD-10-CM | POA: Diagnosis not present

## 2015-12-11 DIAGNOSIS — M545 Low back pain: Secondary | ICD-10-CM | POA: Diagnosis not present

## 2015-12-15 ENCOUNTER — Other Ambulatory Visit: Payer: Self-pay | Admitting: Internal Medicine

## 2015-12-25 ENCOUNTER — Ambulatory Visit (INDEPENDENT_AMBULATORY_CARE_PROVIDER_SITE_OTHER): Payer: Medicare Other | Admitting: Psychiatry

## 2015-12-25 ENCOUNTER — Encounter (HOSPITAL_COMMUNITY): Payer: Self-pay | Admitting: Psychiatry

## 2015-12-25 VITALS — BP 128/72 | HR 93 | Ht 61.5 in | Wt 221.4 lb

## 2015-12-25 DIAGNOSIS — Z823 Family history of stroke: Secondary | ICD-10-CM | POA: Diagnosis not present

## 2015-12-25 DIAGNOSIS — F4322 Adjustment disorder with anxiety: Secondary | ICD-10-CM

## 2015-12-25 DIAGNOSIS — Z79899 Other long term (current) drug therapy: Secondary | ICD-10-CM

## 2015-12-25 DIAGNOSIS — Z8249 Family history of ischemic heart disease and other diseases of the circulatory system: Secondary | ICD-10-CM | POA: Diagnosis not present

## 2015-12-25 DIAGNOSIS — Z87891 Personal history of nicotine dependence: Secondary | ICD-10-CM | POA: Diagnosis not present

## 2015-12-25 MED ORDER — ALPRAZOLAM 1 MG PO TABS: 1.0000 mg | ORAL_TABLET | Freq: Three times a day (TID) | ORAL | 4 refills | Status: DC | PRN

## 2015-12-25 NOTE — Progress Notes (Signed)
Patient ID: Kristen [name], female   DOB: 05/07/1945, 70 y.o.   MRN: [id number] [facility] MD Progress Note  12/25/2015 10:31 AM Kristen [name]  MRN:  [id number] Subjective:  Scared Principal Problem: Adjustment disorder with anxious mood state Diagnosis:  Adjustment disorder with anxious mood state Today the patient is seen on time. She doing very well.her mood is good. Her family is very stable. She does have a son has clinical depression and he's had his medications adjusted. Overall the patient is trying to be active. Unfortunately said significant physical problems. She's had a pulled muscle in her back and ends up having stated that a lot of the time. This patient normally eating very well and she sleeps only about 4or5 hours a day but that's all she needs area patient is wishing to go back to do some volunteer work. She likes work with teenagers.the patient drinks no alcohol. She uses no drugs. Patient is in a positive mood. She still lidocaine and she still has knee problems as well as back problems. Overall though her depression is well-controlled and she has little anxiety this time. She's had no falls. Patient Active Problem List   Diagnosis Date Noted  . Snoring [R06.83] 11/26/2014  . LV dysfunction [I51.9]   . Encounter for therapeutic drug monitoring [Z51.81] 05/03/2013  . Angina decubitus ([place]) [I20.8] 12/07/2012  . DM (diabetes mellitus), type 2 with peripheral vascular complications ([place]) [id number] 12/01/2012  . PVC's/nonsustained VT [I49.3] 10/24/2012  . Morbid obesity with BMI of 40.0-44.9, adult ([place]) [E66.01, Z68.41] 03/15/2012  . H/O: CVA (cerebrovascular accident) [Z86.73] 02/09/2012  . Chest pain, musculoskeletal [R07.89] 07/20/2011  . Hypothyroidism [E03.9] 05/27/2011  . Fibromyalgia [M79.7] 10/08/2010  . Long term current use of anticoagulant [Z79.01] 05/05/2010  . Brachial neuritis or radiculitis [M54.12] 10/30/2009  . Low back pain, non-specific [M54.5]  05/12/2008  . LBBB [I44.7] 03/19/2008  . Chronic lymphadenitis [I88.1] 07/23/2007  . Depression with anxiety [F41.8] 01/29/2007  . Osteoarthritis [M19.90] 11/22/2006  . BURSITIS, ACROMIOCLAVICULAR, LEFT [M71.9, H9227172 10/03/2006  . Hyperlipidemia associated with type 2 diabetes mellitus ([place]) [E11.69, E78.5] 09/13/2006  . Coronary atherosclerosis [I25.10] 09/13/2006  . Paroxysmal atrial fibrillation ([place]) [I48.0] 09/13/2006  . GERD [K21.9] 09/13/2006  . Hypertension associated with diabetes ([place]) [E11.59, I10] 08/30/2006   Total Time spent with patient: 30 minutes  Past Psychiatric History:   Past Medical History:  Past Medical History:  Diagnosis Date  . Arthritis   . Blood transfusion without reported diagnosis   . Bursitis   . CAD (coronary artery disease)    a. s/p CABG 2004.b. stable cath 2014 demonstrating stable CAD and continued patency of her LIMA graft.  . Chronic anticoagulation    on coumadin  . Depression   . Diabetes mellitus   . Diastolic dysfunction    per echo in October 2012 with EF 50 to 55%  . Fibromyalgia   . GERD (gastroesophageal reflux disease) 10/23/2003  . Headache(784.0)   . Hyperlipidemia   . Hypertension   . Hypokalemia   . LBBB (left bundle branch block)   . Lumbar back pain   . LV dysfunction    a. EF 45% by cath 2014. b. EF 50-55% by technically difficult echo in 08/2014.  Marland Kitchen Lymphadenitis   . Morbid obesity ([place])    a. Sleep study negative for significant OSA in 11/2014.  Marland Kitchen PAF (paroxysmal atrial fibrillation) ([place])   . Stroke [facility]) 2004   affected speech per pt    Past  Surgical History:  Procedure Laterality Date  . ABDOMINAL HYSTERECTOMY    . ANGIOPLASTY  laminectomy  . CHOLECYSTECTOMY    . CORONARY ARTERY BYPASS GRAFT     LIMA to LAD   . KNEE ARTHROSCOPY    . LEFT HEART CATHETERIZATION WITH CORONARY/GRAFT ANGIOGRAM  12/07/2012   Procedure: LEFT HEART CATHETERIZATION WITH Beatrix Fetters;  Surgeon: Blane Ohara, MD;   Location: [facility] CATH LAB;  Service: Cardiovascular;;  . LUMBAR LAMINECTOMY     x3  . SPHINCTEROTOMY    . TONSILLECTOMY     Family History:  Family History  Problem Relation Age of Onset  . Heart attack Father   . Heart disease Father   . Stroke Mother   . Kidney disease    . Stroke    . Arthritis    . Hypertension    . Diabetes    . Colon cancer Paternal Uncle    Family Psychiatric  History:  Social History:  History  Alcohol Use No     History  Drug Use No    Social History   Social History  . Marital status: Legally Separated    Spouse name: N/A  . Number of children: N/A  . Years of education: N/A   Social History Main Topics  . Smoking status: Former Smoker    Quit date: 03/07/2001  . Smokeless tobacco: Never Used  . Alcohol use No  . Drug use: No  . Sexual activity: Not Asked   Other Topics Concern  . None   Social History Narrative   Lives locally, has help available if needed.   Additional Social History:                         Sleep: Fair  Appetite:  Fair  Current Medications: Current Outpatient Prescriptions  Medication Sig Dispense Refill  . ALPRAZolam (XANAX) 1 MG tablet Take 1 tablet (1 mg total) by mouth 3 (three) times daily as needed for anxiety. 90 tablet 4  . atenolol (TENORMIN) 100 MG tablet Take 100 mg by mouth every morning. Take 1/2 tablet by mouth in the afternoon    . atorvastatin (LIPITOR) 40 MG tablet TAKE 1 TABLET BY MOUTH EVERY DAY 90 tablet 0  . clotrimazole-betamethasone (LOTRISONE) cream Apply 1 application topically daily as needed (for itching). Reported on 05/08/2015    . fluocinonide cream (LIDEX) [id number] % Apply 1 application topically as needed (For itching). Reported on 05/08/2015    . furosemide (LASIX) 20 MG tablet TAKE 1 TABLET BY MOUTH DAILY 90 tablet 1  . gabapentin (NEURONTIN) 300 MG capsule TAKE ONE CAPSULE BY MOUTH THREE TIMES DAILY 270 capsule 1  . glipiZIDE (GLUCOTROL) 5 MG tablet TAKE 1 TABLET BY MOUTH  DAILY BEFORE BREAKFAST AND 2 TABLETs BEFORE DINNER 90 tablet 5  . HYDROcodone-acetaminophen (NORCO) 10-325 MG per tablet Take 1 tablet by mouth every 4 (four) hours as needed (pain). 150 tablet 0  . hydrocortisone 2.5 % ointment Apply 1 application topically 2 (two) times daily.   3  . Liraglutide (VICTOZA) 18 MG/3ML SOPN Inject 1.8 mg into the skin daily.    . Magnesium Oxide 400 MG CAPS Take 1 capsule (400 mg total) by mouth 2 (two) times daily. 60 capsule 11  . metFORMIN (GLUCOPHAGE-XR) 500 MG 24 hr tablet TAKE 2 TABLETS BY MOUTH TWICE DAILY WITH MEALS 120 tablet 0  . methocarbamol (ROBAXIN) 750 MG tablet Take 1 tablet (750 mg total) by  mouth 4 (four) times daily. 20 tablet 0  . nitroGLYCERIN (NITROSTAT) 0.4 MG SL tablet Place 0.4 mg under the tongue every 5 (five) minutes as needed for chest pain (x 3 doses). Reported on 05/08/2015    . ondansetron (ZOFRAN) 4 MG tablet Take 1 tablet (4 mg total) by mouth every 8 (eight) hours as needed for nausea or vomiting. 20 tablet 0  . ONE TOUCH ULTRA TEST test strip USE TO TEST BLOOD SUGAR FOUR TIMES DAILY 250 each 0  . potassium chloride SA (K-DUR,KLOR-CON) 20 MEQ tablet TAKE 1 TABLET BY MOUTH TWICE DAILY 60 tablet 11  . spironolactone (ALDACTONE) 25 MG tablet Take 1 tablet (25 mg total) by mouth 2 (two) times daily. 180 tablet 3  . telmisartan (MICARDIS) 40 MG tablet TAKE 1 TABLET BY MOUTH EVERY DAY 90 tablet 0  . warfarin (COUMADIN) 6 MG tablet TAKE BY MOUTH AS DIRECTED BY ANTICOAGULATION CLINIC (Patient taking differently: TAKE BY MOUTH AS DIRECTED BY ANTICOAGULATION CLINIC 6mg  mon,wed,fri 3mg  tues,thur,sat,sun) 135 tablet 0   No current facility-administered medications for this visit.     Lab Results: No results found. However, due to the size of the patient record, not all encounters were searched. Please check Results Review for a complete set of results.  Physical Findings: AIMS:  , ,  ,  ,    CIWA:    COWS:     Musculoskeletal: Strength &  Muscle Tone: within normal limits Gait & Station: normal Patient leans: Right  Psychiatric Specialty Exam: ROS  Blood pressure 128/72, pulse 93, height 5' 1.5" (1.562 m), weight 221 lb 6.4 oz (100.4 kg).Body mass index is 41.16 kg/m.  General Appearance: NA  Eye Contact::  Good  Speech:  Clear and Coherent  Volume:  Normal  Mood:  NA  Affect:  Congruent  Thought Process:  Coherent  Orientation:  Full (Time, Place, and Person)  Thought Content:  WDL  Suicidal Thoughts:  No  Homicidal Thoughts:  No  Memory:  NA  Judgement:  Good  Insight:  Good  Psychomotor Activity:  Normal  Concentration:  Good  Recall:  Good  Fund of Knowledge:Fair  Language: Good  Akathisia:  No  Handed:  Left  AIMS (if indicated):     Assets:  Desire for Improvement  ADL's:  Intact  Cognition: WNL  Sleep:      Treatment Plan Summary:  12/25/2015, 10:31 AM At this time the patient continue taking Xanax 1 mg 3 times a day as she's been taking for 2 decades. She shows no problems with cognitive problems. She hardly is ever oversedated. Her anxiety is well-controlled she takes her medicines just as prescribed. The patient drinks no alcohol uses no drugs and is very very compliant. The patient return to see me in 4 months for disability 15 minute check in. Hopefully by that time she'll be physically better and perhaps looking into volunteer work. The patient is not suicidal. In every other way she is medically stable.

## 2015-12-29 DIAGNOSIS — M5136 Other intervertebral disc degeneration, lumbar region: Secondary | ICD-10-CM | POA: Diagnosis not present

## 2015-12-29 DIAGNOSIS — M25561 Pain in right knee: Secondary | ICD-10-CM | POA: Diagnosis not present

## 2015-12-29 DIAGNOSIS — M25562 Pain in left knee: Secondary | ICD-10-CM | POA: Diagnosis not present

## 2015-12-31 ENCOUNTER — Encounter: Payer: Self-pay | Admitting: Cardiovascular Disease

## 2016-01-04 ENCOUNTER — Encounter: Payer: Self-pay | Admitting: Family Medicine

## 2016-01-04 ENCOUNTER — Ambulatory Visit
Admission: RE | Admit: 2016-01-04 | Discharge: 2016-01-04 | Disposition: A | Discharge status: Home / Self Care | Payer: Medicare Other | Source: Ambulatory Visit | Attending: Family Medicine | Admitting: Family Medicine

## 2016-01-04 ENCOUNTER — Ambulatory Visit (INDEPENDENT_AMBULATORY_CARE_PROVIDER_SITE_OTHER): Payer: Medicare Other | Admitting: Family Medicine

## 2016-01-04 VITALS — BP 100/70 | HR 93 | Wt 221.0 lb

## 2016-01-04 DIAGNOSIS — M545 Low back pain, unspecified: Secondary | ICD-10-CM

## 2016-01-04 DIAGNOSIS — R109 Unspecified abdominal pain: Secondary | ICD-10-CM

## 2016-01-04 DIAGNOSIS — Z9889 Other specified postprocedural states: Secondary | ICD-10-CM

## 2016-01-04 DIAGNOSIS — J9811 Atelectasis: Secondary | ICD-10-CM | POA: Diagnosis not present

## 2016-01-04 NOTE — Progress Notes (Signed)
   Subjective:    Patient ID: Kristen [name], female    DOB: 03/22/1945, 70 y.o.   MRN: [id number]  HPI She is here for recheck. She continues to complain of right flank pain. She was seen here and evaluated for this. It was felt to be musculoskeletal. She has also seen her pain specialist Dr. Hardin Negus who apparently gave her Flexeril. She has also seen Dr. Governor Rooks area the pain is made worse with walking as well as some of her back motion. She also complains of point tenderness to the right postero-lateral rib area. No shortness of breath cough or congestion.   Review of Systems     Objective:   Physical Exam Alert and in no distress. Cardiac exam shows regular rhythm without murmurs or gallop. Lungs are clear to auscultation. She does have point tenderness to the right posterior lateral rib area. X-ray shows no changes.       Assessment & Plan:  Acute right flank pain - Plan: DG Chest 2 View  History of lumbar surgery  Low back pain, non-specific No particular therapy as she is presently area on pain medications. Did recommend possibly talk to Dr. Governor Rooks see if there is something he could potentially inject.

## 2016-01-08 DIAGNOSIS — M47816 Spondylosis without myelopathy or radiculopathy, lumbar region: Secondary | ICD-10-CM | POA: Diagnosis not present

## 2016-01-08 DIAGNOSIS — Z79891 Long term (current) use of opiate analgesic: Secondary | ICD-10-CM | POA: Diagnosis not present

## 2016-01-08 DIAGNOSIS — M545 Low back pain: Secondary | ICD-10-CM | POA: Diagnosis not present

## 2016-01-08 DIAGNOSIS — M150 Primary generalized (osteo)arthritis: Secondary | ICD-10-CM | POA: Diagnosis not present

## 2016-01-08 DIAGNOSIS — G894 Chronic pain syndrome: Secondary | ICD-10-CM | POA: Diagnosis not present

## 2016-01-12 ENCOUNTER — Ambulatory Visit: Payer: Self-pay | Admitting: Internal Medicine

## 2016-01-25 ENCOUNTER — Encounter: Payer: Self-pay | Admitting: Family Medicine

## 2016-01-26 ENCOUNTER — Ambulatory Visit (INDEPENDENT_AMBULATORY_CARE_PROVIDER_SITE_OTHER): Payer: Medicare Other | Admitting: Family Medicine

## 2016-01-26 ENCOUNTER — Encounter: Payer: Self-pay | Admitting: Family Medicine

## 2016-01-26 VITALS — BP 120/80 | HR 83 | Ht 62.0 in | Wt 218.0 lb

## 2016-01-26 DIAGNOSIS — T402X5A Adverse effect of other opioids, initial encounter: Secondary | ICD-10-CM

## 2016-01-26 DIAGNOSIS — K5903 Drug induced constipation: Secondary | ICD-10-CM

## 2016-01-26 DIAGNOSIS — Z7901 Long term (current) use of anticoagulants: Secondary | ICD-10-CM | POA: Diagnosis not present

## 2016-01-26 DIAGNOSIS — Z23 Encounter for immunization: Secondary | ICD-10-CM

## 2016-01-26 LAB — PROTIME-INR
INR: 3.6 — AB
Prothrombin Time: 36.3 s — ABNORMAL HIGH (ref 9.0–11.5)

## 2016-01-26 MED ORDER — LUBIPROSTONE 8 MCG PO CAPS: 8.0000 ug | ORAL_CAPSULE | Freq: Two times a day (BID) | ORAL | 0 refills | Status: DC

## 2016-01-26 NOTE — Progress Notes (Signed)
   Subjective:    Patient ID: [name], female    DOB: [birth year], 70 y.o.   MRN: [id number]  HPI She is here for consult concerning continued difficulty with flank pain. Initially the pain was due to physical activity. She had been seen here twice and by her pain management specialist and referred to Dr. Nelva Bush. Apparently shots were recommended but she chose not to have been due to steroids and possible effect on her diabetes. Was difficult to get a good history from her due to her previous difficulty with CVA and communication. She states that roughly during this same timeframe she has had difficulty with constipation. Normally she would go every other day but in the last month or so she is now going every 4 days and sometimes having to use an enema. Also recently apparently she did have her pain medication dosing increased. She recently bought some MiraLAX but has yet to start this. She also needs a PT/INR today.  Review of Systems     Objective:   Physical Exam Alert and in no distress otherwise not examined       Assessment & Plan:  Need for prophylactic vaccination and inoculation against influenza - Plan: Flu vaccine HIGH DOSE PF (Fluzone High dose)  Current use of long term anticoagulation - Plan: Protime-INR  Therapeutic opioid-induced constipation (OIC) At this point it does seem that if she is having difficulty with opioid-induced constipation. A sample of Amitiza 8 mg was given. She will call next week and let me know how this is working.

## 2016-01-28 ENCOUNTER — Other Ambulatory Visit: Payer: Self-pay | Admitting: Cardiovascular Disease

## 2016-02-03 ENCOUNTER — Encounter: Payer: Self-pay | Admitting: Family Medicine

## 2016-02-03 DIAGNOSIS — T402X5A Adverse effect of other opioids, initial encounter: Principal | ICD-10-CM

## 2016-02-03 DIAGNOSIS — K5903 Drug induced constipation: Secondary | ICD-10-CM

## 2016-02-03 MED ORDER — LUBIPROSTONE 8 MCG PO CAPS: 8.0000 ug | ORAL_CAPSULE | Freq: Two times a day (BID) | ORAL | 11 refills | Status: DC

## 2016-02-05 ENCOUNTER — Emergency Department (HOSPITAL_COMMUNITY)
Admission: EM | Admit: 2016-02-05 | Discharge: 2016-02-05 | Disposition: A | Discharge status: Home / Self Care | Payer: Medicare Other | Attending: Emergency Medicine | Admitting: Emergency Medicine

## 2016-02-05 ENCOUNTER — Encounter (HOSPITAL_COMMUNITY): Payer: Self-pay | Admitting: *Deleted

## 2016-02-05 ENCOUNTER — Emergency Department (HOSPITAL_COMMUNITY): Payer: Medicare Other

## 2016-02-05 DIAGNOSIS — I10 Essential (primary) hypertension: Secondary | ICD-10-CM | POA: Insufficient documentation

## 2016-02-05 DIAGNOSIS — I2510 Atherosclerotic heart disease of native coronary artery without angina pectoris: Secondary | ICD-10-CM | POA: Diagnosis not present

## 2016-02-05 DIAGNOSIS — Z7901 Long term (current) use of anticoagulants: Secondary | ICD-10-CM | POA: Diagnosis not present

## 2016-02-05 DIAGNOSIS — E039 Hypothyroidism, unspecified: Secondary | ICD-10-CM | POA: Diagnosis not present

## 2016-02-05 DIAGNOSIS — Z7984 Long term (current) use of oral hypoglycemic drugs: Secondary | ICD-10-CM | POA: Insufficient documentation

## 2016-02-05 DIAGNOSIS — E119 Type 2 diabetes mellitus without complications: Secondary | ICD-10-CM | POA: Diagnosis not present

## 2016-02-05 DIAGNOSIS — Z8673 Personal history of transient ischemic attack (TIA), and cerebral infarction without residual deficits: Secondary | ICD-10-CM | POA: Insufficient documentation

## 2016-02-05 DIAGNOSIS — Z951 Presence of aortocoronary bypass graft: Secondary | ICD-10-CM | POA: Diagnosis not present

## 2016-02-05 DIAGNOSIS — J9811 Atelectasis: Secondary | ICD-10-CM | POA: Diagnosis not present

## 2016-02-05 DIAGNOSIS — Z87891 Personal history of nicotine dependence: Secondary | ICD-10-CM | POA: Diagnosis not present

## 2016-02-05 DIAGNOSIS — R002 Palpitations: Secondary | ICD-10-CM

## 2016-02-05 LAB — CBC
HEMATOCRIT: 42.9 % (ref 36.0–46.0)
HEMOGLOBIN: 14.7 g/dL (ref 12.0–15.0)
MCH: 32.3 pg (ref 26.0–34.0)
MCHC: 34.3 g/dL (ref 30.0–36.0)
MCV: 94.3 fL (ref 78.0–100.0)
Platelets: 323 10*3/uL (ref 150–400)
RBC: 4.55 MIL/uL (ref 3.87–5.11)
RDW: 14.3 % (ref 11.5–15.5)
WBC: 6.6 10*3/uL (ref 4.0–10.5)

## 2016-02-05 LAB — PROTIME-INR
INR: 2.21
PROTHROMBIN TIME: 24.9 s — AB (ref 11.4–15.2)

## 2016-02-05 LAB — BASIC METABOLIC PANEL
ANION GAP: 11 (ref 5–15)
BUN: 16 mg/dL (ref 6–20)
CHLORIDE: 106 mmol/L (ref 101–111)
CO2: 21 mmol/L — ABNORMAL LOW (ref 22–32)
Calcium: 9.9 mg/dL (ref 8.9–10.3)
Creatinine, Ser: 0.9 mg/dL (ref 0.44–1.00)
GFR calc Af Amer: >60 mL/min (ref >60)
GLUCOSE: 163 mg/dL — AB (ref 65–99)
POTASSIUM: 4.3 mmol/L (ref 3.5–5.1)
SODIUM: 138 mmol/L (ref 135–145)

## 2016-02-05 LAB — I-STAT TROPONIN, ED
Troponin i, poc: 0 ng/mL (ref 0.00–0.08)
Troponin i, poc: 0 ng/mL (ref 0.00–0.08)

## 2016-02-05 LAB — CBG MONITORING, ED
GLUCOSE-CAPILLARY: 164 mg/dL — AB (ref 65–99)
GLUCOSE-CAPILLARY: 144 mg/dL — AB (ref 65–99)

## 2016-02-05 MED ORDER — ACETAMINOPHEN 500 MG PO TABS: 500.0000 mg | ORAL_TABLET | Freq: Once | ORAL | Status: DC

## 2016-02-05 NOTE — ED Notes (Signed)
MD at bedside. 

## 2016-02-05 NOTE — ED Triage Notes (Signed)
Pt reports having palpitations today and feeling near syncopal while at store. Has cardiac history. ekg done and no acute distress noted.

## 2016-02-05 NOTE — ED Notes (Signed)
cbg was 144

## 2016-02-05 NOTE — ED Provider Notes (Signed)
[facility] DEPT Provider Note   CSN: [id number] Arrival date & time: 02/05/16  1515     History   Chief Complaint Chief Complaint  Patient presents with  . Palpitations  . Near Syncope    HPI Kristen [name] is a 70 y.o. female.  The history is provided by the patient. No language interpreter was used.  Palpitations   Associated symptoms include near-syncope.  Near Syncope    Kristen [name] is a 71 y.o. female who presents to the Emergency Department complaining of palpitations, near syncope.  She has a history of atrial fibrillation and today when she was at the grocery store she began to feel lightheaded with palpitations. She took her pulse and it was rapid. When she got home it was 105. She denies any chest pain, diaphoresis, shortness of breath, abdominal pain, vomiting, diarrhea. She currently feels improved compared to earlier. Symptoms are moderate in wax and waning and improving. Past Medical History:  Diagnosis Date  . Arthritis   . Blood transfusion without reported diagnosis   . Bursitis   . CAD (coronary artery disease)    a. s/p CABG 2004.b. stable cath 2014 demonstrating stable CAD and continued patency of her LIMA graft.  . Chronic anticoagulation    on coumadin  . Depression   . Diabetes mellitus   . Diastolic dysfunction    per echo in October 2012 with EF 50 to 55%  . Fibromyalgia   . GERD (gastroesophageal reflux disease) 10/23/2003  . Headache(784.0)   . Hyperlipidemia   . Hypertension   . Hypokalemia   . LBBB (left bundle branch block)   . Lumbar back pain   . LV dysfunction    a. EF 45% by cath 2014. b. EF 50-55% by technically difficult echo in 08/2014.  Marland Kitchen Lymphadenitis   . Morbid obesity ([place])    a. Sleep study negative for significant OSA in 11/2014.  Marland Kitchen PAF (paroxysmal atrial fibrillation) ([place])   . Stroke [facility]) 2004   affected speech per pt    Patient Active Problem List   Diagnosis Date Noted  . Snoring 11/26/2014  .  LV dysfunction   . Encounter for therapeutic drug monitoring 05/03/2013  . Angina decubitus ([place]) 12/07/2012  . DM (diabetes mellitus), type 2 with peripheral vascular complications ([place]) [id number]  . PVC's/nonsustained VT 10/24/2012  . Morbid obesity with BMI of 40.0-44.9, adult ([place]) 03/15/2012  . H/O: CVA (cerebrovascular accident) 02/09/2012  . Chest pain, musculoskeletal 07/20/2011  . Hypothyroidism 05/27/2011  . Fibromyalgia 10/08/2010  . Long term current use of anticoagulant 05/05/2010  . Brachial neuritis or radiculitis 10/30/2009  . Low back pain, non-specific 05/12/2008  . LBBB 03/19/2008  . Chronic lymphadenitis 07/23/2007  . Depression with anxiety 01/29/2007  . Osteoarthritis 11/22/2006  . BURSITIS, ACROMIOCLAVICULAR, LEFT 10/03/2006  . Hyperlipidemia associated with type 2 diabetes mellitus ([place]) 09/13/2006  . Coronary atherosclerosis 09/13/2006  . Paroxysmal atrial fibrillation ([place]) 09/13/2006  . GERD 09/13/2006  . Hypertension associated with diabetes ([place]) 08/30/2006    Past Surgical History:  Procedure Laterality Date  . ABDOMINAL HYSTERECTOMY    . ANGIOPLASTY  laminectomy  . CHOLECYSTECTOMY    . CORONARY ARTERY BYPASS GRAFT     LIMA to LAD   . KNEE ARTHROSCOPY    . LEFT HEART CATHETERIZATION WITH CORONARY/GRAFT ANGIOGRAM  12/07/2012   Procedure: LEFT HEART CATHETERIZATION WITH Beatrix Fetters;  Surgeon: Blane Ohara, MD;  Location: [facility] CATH LAB;  Service: Cardiovascular;;  . LUMBAR  LAMINECTOMY     x3  . SPHINCTEROTOMY    . TONSILLECTOMY      OB History    No data available       Home Medications    Prior to Admission medications   Medication Sig Start Date End Date Taking? Authorizing Provider  acetaminophen (TYLENOL) 500 MG tablet Take 500-1,000 mg by mouth every 6 (six) hours as needed for headache.   Yes Historical Provider, MD  ALPRAZolam Duanne Moron) 1 MG tablet Take 1 tablet (1 mg total) by mouth 3 (three) times daily as needed for  anxiety. 12/25/15  Yes Norma Fredrickson, MD  atenolol (TENORMIN) 100 MG tablet Take 50-100 mg by mouth See admin instructions. 100 mg in the morning and 50 mg in the afternoon if needed for palpitations   Yes Historical Provider, MD  atorvastatin (LIPITOR) 40 MG tablet TAKE 1 TABLET BY MOUTH EVERY DAY Patient taking differently: Take 40 mg by mouth once a day 08/05/15  Yes Denita Lung, MD  clotrimazole-betamethasone (LOTRISONE) cream Apply 1 application topically daily as needed (for itching). Reported on 05/08/2015   Yes Historical Provider, MD  cyclobenzaprine (FLEXERIL) 10 MG tablet Take 10 mg by mouth every 8 (eight) hours as needed for muscle spasms. 12/11/15  Yes Historical Provider, MD  furosemide (LASIX) 20 MG tablet TAKE 1 TABLET BY MOUTH DAILY Patient taking differently: Take 20 mg by mouth once a day 06/16/15  Yes Denita Lung, MD  gabapentin (NEURONTIN) 300 MG capsule TAKE ONE CAPSULE BY MOUTH THREE TIMES DAILY Patient taking differently: Take 300 mg by mouth three times a day 09/16/14  Yes Hoyt Koch, MD  glipiZIDE (GLUCOTROL) 5 MG tablet TAKE 1 TABLET BY MOUTH DAILY BEFORE BREAKFAST AND 2 TABLETs BEFORE DINNER Patient taking differently: Take 5-10 mg by mouth See admin instructions. 5 mg in the morning prior to breakfast and 10 mg in the evening prior to dinner 10/28/15  Yes Philemon Kingdom, MD  hydrocortisone 2.5 % ointment Apply 1 application topically 2 (two) times daily.  11/15/13  Yes Historical Provider, MD  Liraglutide (VICTOZA) 18 MG/3ML SOPN Inject 1.8 mg into the skin every morning.    Yes Historical Provider, MD  lubiprostone (AMITIZA) 8 MCG capsule Take 1 capsule (8 mcg total) by mouth 2 (two) times daily with a meal. 02/03/16  Yes Denita Lung, MD  Magnesium Oxide 400 MG CAPS Take 1 capsule (400 mg total) by mouth 2 (two) times daily. 03/10/15  Yes Dayna N Dunn, PA-C  metFORMIN (GLUCOPHAGE-XR) 500 MG 24 hr tablet TAKE 2 TABLETS BY MOUTH TWICE DAILY WITH MEALS Patient  taking differently: Take 1,000 mg by mouth two times a day 12/15/15  Yes Philemon Kingdom, MD  nitroGLYCERIN (NITROSTAT) 0.4 MG SL tablet Place 0.4 mg under the tongue every 5 (five) minutes as needed for chest pain (x 3 doses). Reported on 05/08/2015   Yes Historical Provider, MD  ONE TOUCH ULTRA TEST test strip USE TO TEST BLOOD SUGAR FOUR TIMES DAILY 10/05/15  Yes Philemon Kingdom, MD  oxyCODONE-acetaminophen (PERCOCET) 10-325 MG tablet Take 1 tablet by mouth every 4 (four) hours as needed for pain. 01/08/16  Yes Historical Provider, MD  potassium chloride SA (K-DUR,KLOR-CON) 20 MEQ tablet TAKE 1 TABLET BY MOUTH TWICE DAILY Patient taking differently: Take 20 mEq by mouth two times a day 02/01/16  Yes Sherren Mocha, MD  spironolactone (ALDACTONE) 25 MG tablet Take 1 tablet (25 mg total) by mouth 2 (two) times daily. 04/21/15  Yes Denita Lung, MD  telmisartan (MICARDIS) 40 MG tablet TAKE 1 TABLET BY MOUTH EVERY DAY Patient taking differently: Take 40 mg by mouth once a day 11/24/15  Yes Denita Lung, MD  warfarin (COUMADIN) 6 MG tablet TAKE BY MOUTH AS DIRECTED BY ANTICOAGULATION CLINIC Patient taking differently: 3 mg by mouth at night on Sun/Tues/Thurs/Sat and 6 mg on Mon/Wed/Fri 08/20/15  Yes Denita Lung, MD  fluocinonide cream (LIDEX) [id number] % Apply 1 application topically as needed (For itching). Reported on 05/08/2015 08/26/13   Historical Provider, MD  HYDROcodone-acetaminophen (NORCO) 10-325 MG per tablet Take 1 tablet by mouth every 4 (four) hours as needed (pain). Patient not taking: Reported on 02/05/2016 09/16/14   Denita Lung, MD    Family History Family History  Problem Relation Age of Onset  . Heart attack Father   . Heart disease Father   . Stroke Mother   . Kidney disease    . Stroke    . Arthritis    . Hypertension    . Diabetes    . Colon cancer Paternal Uncle     Social History Social History  Substance Use Topics  . Smoking status: Former Smoker    Quit date:  03/07/2001  . Smokeless tobacco: Never Used  . Alcohol use No     Allergies   Metformin and related; Cleocin [clindamycin hcl]; Codeine; Doxycycline hyclate; Macrolides and ketolides; Morphine; Pentazocine lactate; Vibramycin [doxycycline calcium]; Definity [perflutren lipid microsphere]; and Sulfonamide derivatives   Review of Systems Review of Systems  Cardiovascular: Positive for palpitations and near-syncope.  All other systems reviewed and are negative.    Physical Exam Updated Vital Signs BP 114/62 (BP Location: Right Arm)   Pulse 87   Temp 98.9 F (37.2 C) (Oral)   Resp 19   SpO2 99%   Physical Exam  Constitutional: She is oriented to person, place, and time. She appears well-developed and well-nourished.  HENT:  Head: Normocephalic and atraumatic.  Cardiovascular: Normal rate and regular rhythm.   No murmur heard. Pulmonary/Chest: Effort normal and breath sounds normal. No respiratory distress.  Abdominal: Soft. There is no tenderness. There is no rebound and no guarding.  Musculoskeletal: She exhibits no edema or tenderness.  Neurological: She is alert and oriented to person, place, and time.  Skin: Skin is warm and dry.  Psychiatric: She has a normal mood and affect. Her behavior is normal.  Nursing note and vitals reviewed.    ED Treatments / Results  Labs (all labs ordered are listed, but only abnormal results are displayed) Labs Reviewed  BASIC METABOLIC PANEL - Abnormal; Notable for the following:       Result Value   CO2 21 (*)    Glucose, Bld 163 (*)    All other components within normal limits  PROTIME-INR - Abnormal; Notable for the following:    Prothrombin Time 24.9 (*)    All other components within normal limits  CBG MONITORING, ED - Abnormal; Notable for the following:    Glucose-Capillary 164 (*)    All other components within normal limits  CBG MONITORING, ED - Abnormal; Notable for the following:    Glucose-Capillary 144 (*)    All  other components within normal limits  CBC  I-STAT TROPOININ, ED  Randolm Idol, ED    EKG  EKG Interpretation  Date/Time:  Friday February 05 2016 15:21:34 EST Ventricular Rate:  106 PR Interval:  154 QRS Duration: 134 QT Interval:  378  QTC Calculation: 502 R Axis:   114 Text Interpretation:  Sinus tachycardia Right axis deviation Non-specific intra-ventricular conduction block Cannot rule out Anterior infarct , age undetermined Abnormal ECG Confirmed by Hazle Coca [phone]) on 02/05/2016 6:25:12 PM       Radiology Dg Chest 2 View  Result Date: 02/05/2016 CLINICAL DATA:  Cardiac palpitations with near syncope. Hypertension. EXAM: CHEST  2 VIEW COMPARISON:  January 04, 2016 FINDINGS: Mild linear atelectasis in the left base is stable. Lungs elsewhere are clear. Heart size and pulmonary vascularity are normal. No adenopathy. There is atherosclerotic calcification in the aorta. Patient is status post median sternotomy. No bone lesions. IMPRESSION: Linear atelectasis left base. No edema or consolidation. Aortic atherosclerosis. Electronically Signed   By: Lowella Grip III M.D.   On: 02/05/2016 16:21    Procedures Procedures (including critical care time)  Medications Ordered in ED Medications  acetaminophen (TYLENOL) tablet 500 mg (500 mg Oral Refused 02/05/16 2109)     Initial Impression / Assessment and Plan / ED Course  I have reviewed the triage vital signs and the nursing notes.  Pertinent labs & imaging results that were available during my care of the patient were reviewed by me and considered in my medical decision making (see chart for details).  Clinical Course     Patient with history of atrial fibrillation on anticoagulation here with episode of palpitations and near syncopal type sensation. She is in no distress in the emergency department. She is in a sinus rhythm on the monitor. Presentation is not consistent with ACS, PE. Discussed the patient's Cardiology  follow-up, home care, return precautions for palpitations.  Final Clinical Impressions(s) / ED Diagnoses   Final diagnoses:  Palpitations    New Prescriptions Discharge Medication List as of 02/05/2016  9:13 PM       Quintella Reichert, MD 02/05/16 2355

## 2016-02-05 NOTE — ED Notes (Signed)
ED Provider at bedside. 

## 2016-02-09 DIAGNOSIS — M150 Primary generalized (osteo)arthritis: Secondary | ICD-10-CM | POA: Diagnosis not present

## 2016-02-09 DIAGNOSIS — Z79891 Long term (current) use of opiate analgesic: Secondary | ICD-10-CM | POA: Diagnosis not present

## 2016-02-09 DIAGNOSIS — G894 Chronic pain syndrome: Secondary | ICD-10-CM | POA: Diagnosis not present

## 2016-02-09 DIAGNOSIS — M545 Low back pain: Secondary | ICD-10-CM | POA: Diagnosis not present

## 2016-02-15 ENCOUNTER — Ambulatory Visit: Payer: Self-pay | Admitting: Internal Medicine

## 2016-02-19 ENCOUNTER — Encounter: Payer: Self-pay | Admitting: Family Medicine

## 2016-02-19 ENCOUNTER — Other Ambulatory Visit: Payer: Self-pay | Admitting: Family Medicine

## 2016-03-05 ENCOUNTER — Other Ambulatory Visit: Payer: Self-pay | Admitting: Family Medicine

## 2016-03-08 ENCOUNTER — Other Ambulatory Visit: Payer: Self-pay | Admitting: Physician Assistant

## 2016-03-08 NOTE — Telephone Encounter (Signed)
Is this okay to refill? 

## 2016-03-11 DIAGNOSIS — G894 Chronic pain syndrome: Secondary | ICD-10-CM | POA: Diagnosis not present

## 2016-03-11 DIAGNOSIS — M150 Primary generalized (osteo)arthritis: Secondary | ICD-10-CM | POA: Diagnosis not present

## 2016-03-11 DIAGNOSIS — Z79891 Long term (current) use of opiate analgesic: Secondary | ICD-10-CM | POA: Diagnosis not present

## 2016-03-11 DIAGNOSIS — M47816 Spondylosis without myelopathy or radiculopathy, lumbar region: Secondary | ICD-10-CM | POA: Diagnosis not present

## 2016-03-12 ENCOUNTER — Other Ambulatory Visit: Payer: Self-pay | Admitting: Internal Medicine

## 2016-03-14 ENCOUNTER — Ambulatory Visit: Payer: Medicare Other

## 2016-03-15 ENCOUNTER — Ambulatory Visit (INDEPENDENT_AMBULATORY_CARE_PROVIDER_SITE_OTHER): Payer: Medicare Other | Admitting: Internal Medicine

## 2016-03-15 ENCOUNTER — Encounter: Payer: Self-pay | Admitting: Internal Medicine

## 2016-03-15 VITALS — BP 128/92 | HR 89 | Ht 61.0 in | Wt 218.0 lb

## 2016-03-15 DIAGNOSIS — E1151 Type 2 diabetes mellitus with diabetic peripheral angiopathy without gangrene: Secondary | ICD-10-CM | POA: Diagnosis not present

## 2016-03-15 LAB — POCT GLYCOSYLATED HEMOGLOBIN (HGB A1C): HEMOGLOBIN A1C: 8

## 2016-03-15 MED ORDER — ONETOUCH ULTRA SYSTEM W/DEVICE KIT: 1.0000 | PACK | Freq: Once | 0 refills | Status: AC

## 2016-03-15 NOTE — Addendum Note (Signed)
Addended by: Caprice Beaver T on: 03/15/2016 02:31 PM   Modules accepted: Orders

## 2016-03-15 NOTE — Progress Notes (Signed)
Patient ID: Kristen [name], female   DOB: March 05, 1946, 71 y.o.   MRN: [id number]  HPI: Kristen [name] is a 71 y.o.-year-old female, returning for DM2, dx 2008, insulin-dependent, uncontrolled, with complications (CAD, PAD, h/o CVD, peripheral neuropathy). Last visit 5 mo ago.  She was in ED with palpitations in 02/05/2016. She does have a h/o A fib.  Last hemoglobin A1c was: Lab Results  Component Value Date   HGBA1C 7.5 10/12/2015   HGBA1C 7.1 07/07/2015   HGBA1C 6.9 04/09/2015  She had a steroid inj last month.  Pt is on a regimen of: - Victoza 1.8 mg in am - Metformin XR 1000 mg 2x a day - Glipizide 5 >> 10 mg before dinner - Glipizide 2.5 >> 5 mg daily before breakfast. Stopped Lantus 20 units in 12/2014. We tried NPH. She was on Levemir pen 30 units 2x a day - gets the med directly from [facility] - but had to stop b/c of price. She is now Metformin XR 1000 mg bid b/c N/D and palpitations with regular Metformin - retried with the same results She was on Bydureon in the past, then also on Novolog 60-70-75.  She tried Januvia before.   Pt checked her sugars 2-3x a day - no log, no meter >> she did not check sugars as she lost her meter. Sugars from last visit: - am: 90-126 >> 90-140 >> 87, 109-143 >> 120-130s >> 190-224 - 2h after b'fast: 135-165, 202 >> 95-155, 161 >> n/c >> 117, 156 >> n/c - before lunch:93-135 >> 129 >> 104-172, 197, 215 >> 150-160s >> n/c - 2h after lunch: 111-204 >> 110-169 >> 121, 236 >> 107-199>> n/c - before dinner:93-147, 175 >> 121-157 >> 114-160, 180, 196 >> n/c >> 170-240 - after dinner:  89-170, 192 >> 150-195, 263 >> 123-203  >> 160-180 >> 348 - bedtime: 130s >> see above >> 112-168, 208 >> 69-168 >> 96-187 >> 106-184 >> see above - nighttime: 56, 71-145 >> n/c No recent lows;  she has hypoglycemia awareness at 100 >> 87 >> 87 >> 100 >> ? Highest: 300s - steroid >> 314 x1 (stressed) >> 345x1 >> 348 x5 >> ?  Pt does not have chronic  kidney disease, last BUN/creatinine was:  Lab Results  Component Value Date   BUN 16 02/05/2016   CREATININE 0.90 02/05/2016  On Telmisartan. Last set of lipids: Lab Results  Component Value Date   CHOL 100 (L) 09/01/2015   HDL 37 (L) 09/01/2015   LDLCALC 42 09/01/2015   LDLDIRECT 116.0 11/28/2008   TRIG 104 09/01/2015   CHOLHDL 2.7 09/01/2015  On Lipitor. Pt's last eye exam was in 02/2015. Dr. Bing [name]. + hypertensive retinopathy.  Denies numbness and tingling in her legs. She had a foot exam in 04/03/2015.  She also has a history of hypertension, paroxysmal A. Fib-on anticoagulation, depression, osteoarthritis - status post arthroscopic knee surgeries x2, status post lumbar laminectomy x3, hyperlipidemia, morbid obesity, chronic lymphadenitis, GERD, hypothyroidism, headaches, fibromyalgia.  ROS: Constitutional: no fatigue, no weight gain, + hot flashes, no nocturia Eyes: + blurry vision, no xerophthalmia ENT: no sore throat, no nodules palpated in throat, no dysphagia/odynophagia, no hoarseness Cardiovascular: no CP/+ palpitations/+ SOB/no leg swelling Respiratory: no cough/+ SOB with activity  Gastrointestinal: no vomiting/diarrhea/constipation Musculoskeletal: + muscle aches/+ joint aches Skin: no rashes, no hair loss Neurological: no tremors/numbness/tingling/dizziness, + headaches  I reviewed pt's medications, allergies, PMH, social hx, family hx, and changes were documented in the history of  present illness. Otherwise, unchanged from my initial visit note.   PE: BP (!) 128/92 (BP Location: Left Arm, Patient Position: Sitting)   Pulse 89   Ht 5\' 1"  (1.549 m)   Wt 218 lb (98.9 kg)   SpO2 97%   BMI 41.19 kg/m  Body mass index is 41.19 kg/m. Wt Readings from Last 3 Encounters:  03/15/16 218 lb (98.9 kg)  01/26/16 218 lb (98.9 kg)  01/04/16 221 lb (100.2 kg)   Constitutional: overweight, in NAD, full supraclavicular fat pads Eyes: PERRLA, EOMI, no exophthalmos ENT:  moist mucous membranes, no thyromegaly, no cervical lymphadenopathy Cardiovascular: tachycardia, RR, No MRG Respiratory: CTA B Gastrointestinal: abdomen soft, NT, ND, BS+ Musculoskeletal: no deformities, strength intact in all 4 Skin: moist, warm, acanthosis nigricans  ASSESSMENT: 1. DM2, insulin-dependent, uncontrolled, with complications - CAD- Echo 12/2010: EF 50-55%, status post CABG 2004: LIMA to LAD - cards: Dr. Lia [name]. Also has a LBBB.  - PAD - h/o stroke - peripheral neuropathy - on neurontin  PLAN:  1. Patient with a several-year history of diabetes, previuously on basal-bolus regimen, then off all insulin with great control! Now sugars started to increase in the last year, especially over the Holidays. Will not change regimen as she is not checking sugars (new meter Rx sent to her pharmacy) but will have her back in 1.5 mo with her sugar log. We discussed about improving her diet by including more fruit and veggies. - I advised her to Patient Instructions  Please continue: - Victoza 1.8 mg in am - Metformin XR 1000 mg 2x a day - Glipizide 10 mg before dinner and 5 mg daily before breakfast.  Start checking sugars 1-2 x a day.  Please return in 1.5 months with your sugar log.  - up to date with eye exams - given more sugar logs - will check HbA1c today >> 8% (higher) - RTC in 1.5 mo  Kristen Kingdom, MD PhD [facility]

## 2016-03-15 NOTE — Patient Instructions (Addendum)
Please continue: - Victoza 1.8 mg in am - Metformin XR 1000 mg 2x a day - Glipizide 10 mg before dinner and 5 mg daily before breakfast.  Start checking sugars 1-2 x a day.  Please return in 1.5 months with your sugar log.

## 2016-03-16 ENCOUNTER — Ambulatory Visit: Payer: Medicare Other | Admitting: Family Medicine

## 2016-03-16 DIAGNOSIS — Z7901 Long term (current) use of anticoagulants: Secondary | ICD-10-CM

## 2016-03-16 LAB — PROTIME-INR
INR: 3.1 — AB
Prothrombin Time: 31.8 s — ABNORMAL HIGH (ref 9.0–11.5)

## 2016-03-17 NOTE — Progress Notes (Signed)
PT/INR is 3.1. Patient informed through My Chart. Recheck here 1 month.

## 2016-03-20 ENCOUNTER — Other Ambulatory Visit: Payer: Self-pay | Admitting: Family Medicine

## 2016-03-21 NOTE — Telephone Encounter (Signed)
Is this okay to refill? 

## 2016-03-27 ENCOUNTER — Encounter: Payer: Self-pay | Admitting: Cardiovascular Disease

## 2016-03-28 ENCOUNTER — Other Ambulatory Visit: Payer: Self-pay | Admitting: Cardiovascular Disease

## 2016-03-28 ENCOUNTER — Telehealth: Payer: Self-pay

## 2016-03-28 DIAGNOSIS — I480 Paroxysmal atrial fibrillation: Secondary | ICD-10-CM

## 2016-03-28 NOTE — Telephone Encounter (Signed)
I spoke with the pt in regards to her My Chart message:  Dr Burt Knack, my BP is increasing! I took it at 630p. It was 160/90, heart rate 100   The pt states she has a headache with elevated BP.  I reviewed the pt's medication list and for the past week she has been taking her as needed Atenolol 100mg  in the morning and 50mg  in the evening without improvement in vitals.  The pt would like to be seen for an appointment.  Office visit scheduled 03/30/16 with Dr Burt Knack.

## 2016-03-30 ENCOUNTER — Ambulatory Visit (INDEPENDENT_AMBULATORY_CARE_PROVIDER_SITE_OTHER): Payer: Medicare Other | Admitting: Cardiovascular Disease

## 2016-03-30 ENCOUNTER — Telehealth: Payer: Self-pay | Admitting: Internal Medicine

## 2016-03-30 ENCOUNTER — Encounter: Payer: Self-pay | Admitting: Cardiovascular Disease

## 2016-03-30 VITALS — BP 118/90 | HR 88 | Ht 61.0 in | Wt 222.1 lb

## 2016-03-30 DIAGNOSIS — I480 Paroxysmal atrial fibrillation: Secondary | ICD-10-CM

## 2016-03-30 MED ORDER — METOPROLOL SUCCINATE ER 100 MG PO TB24: 100.0000 mg | ORAL_TABLET | Freq: Every day | ORAL | 3 refills | Status: DC

## 2016-03-30 NOTE — Progress Notes (Signed)
Cardiology Office Note Date:  03/30/2016   ID:  Kristen [name] [birth year], MRN [id number]  PCP:  Wyatt Haste, MD  Cardiologist:  Sherren Mocha, MD    Chief Complaint  Patient presents with  . paf    elevated blood pressure and pulse     History of Present Illness: Kristen [name] is a 71 y.o. female who presents for evaluation of elevated BP and heart rate. The patient has been followed for many years, last  Seen in September 2017. Medical problems include CABG 2004 (stable cath 2014 demonstrating stable CAD and continued patency of her LIMA graft), paroxysmal atrial fibrillation, history of stroke, morbid obesity, diabetes, hypertension, left bundle branch block, fibromyalgia, panic and anxiety.  She has recently noted an increase in her blood pressure. Also has had some elevated heart rates. She really hasn't had any other change in symptoms. She denies chest pain or pressure. She's been compliant with her medications and continues on atenolol 100 mg in the morning and 50 mg in the evening. She also takes spironolactone and telmisartan. She denies edema, orthopnea, or PND. She's been checking her blood pressure on a daily basis and reason readings ranging from 146-177/76-90.   Past Medical History:  Diagnosis Date  . Arthritis   . Blood transfusion without reported diagnosis   . Bursitis   . CAD (coronary artery disease)    a. s/p CABG 2004.b. stable cath 2014 demonstrating stable CAD and continued patency of her LIMA graft.  . Chronic anticoagulation    on coumadin  . Depression   . Diabetes mellitus   . Diastolic dysfunction    per echo in October 2012 with EF 50 to 55%  . Fibromyalgia   . GERD (gastroesophageal reflux disease) 10/23/2003  . Headache(784.0)   . Hyperlipidemia   . Hypertension   . Hypokalemia   . LBBB (left bundle branch block)   . Lumbar back pain   . LV dysfunction    a. EF 45% by cath 2014. b. EF 50-55% by technically  difficult echo in 08/2014.  Marland Kitchen Lymphadenitis   . Morbid obesity ([place])    a. Sleep study negative for significant OSA in 11/2014.  Marland Kitchen PAF (paroxysmal atrial fibrillation) ([place])   . Stroke [facility]) 2004   affected speech per pt    Past Surgical History:  Procedure Laterality Date  . ABDOMINAL HYSTERECTOMY    . ANGIOPLASTY  laminectomy  . CHOLECYSTECTOMY    . CORONARY ARTERY BYPASS GRAFT     LIMA to LAD   . KNEE ARTHROSCOPY    . LEFT HEART CATHETERIZATION WITH CORONARY/GRAFT ANGIOGRAM  12/07/2012   Procedure: LEFT HEART CATHETERIZATION WITH Beatrix Fetters;  Surgeon: Blane Ohara, MD;  Location: [facility] CATH LAB;  Service: Cardiovascular;;  . LUMBAR LAMINECTOMY     x3  . SPHINCTEROTOMY    . TONSILLECTOMY      Current Outpatient Prescriptions  Medication Sig Dispense Refill  . acetaminophen (TYLENOL) 500 MG tablet Take 500-1,000 mg by mouth every 6 (six) hours as needed for headache.    . ALPRAZolam (XANAX) 1 MG tablet Take 1 tablet (1 mg total) by mouth 3 (three) times daily as needed for anxiety. 90 tablet 4  . atenolol (TENORMIN) 100 MG tablet Take 50-100 mg by mouth See admin instructions. 100 mg in the morning and 50 mg in the afternoon if needed for palpitations    . atorvastatin (LIPITOR) 40 MG tablet TAKE 1 TABLET BY MOUTH EVERY DAY (  Patient taking differently: Take 40 mg by mouth once a day) 90 tablet 0  . clotrimazole-betamethasone (LOTRISONE) cream Apply 1 application topically daily as needed (for itching). Reported on 05/08/2015    . cyclobenzaprine (FLEXERIL) 10 MG tablet Take 10 mg by mouth every 8 (eight) hours as needed for muscle spasms.    . fluocinonide cream (LIDEX) [id number] % Apply 1 application topically as needed (For itching). Reported on 05/08/2015    . furosemide (LASIX) 20 MG tablet TAKE 1 TABLET BY MOUTH DAILY 90 tablet 0  . gabapentin (NEURONTIN) 300 MG capsule TAKE ONE CAPSULE BY MOUTH THREE TIMES DAILY 90 capsule 3  . glipiZIDE (GLUCOTROL) 5 MG tablet Take 5 mg  by mouth daily before breakfast. Take 10 mg by mouth every evening    . hydrocortisone 2.5 % ointment Apply 1 application topically 2 (two) times daily.   3  . Liraglutide (VICTOZA) 18 MG/3ML SOPN Inject 1.8 mg into the skin every morning.     . lubiprostone (AMITIZA) 8 MCG capsule Take 1 capsule (8 mcg total) by mouth 2 (two) times daily with a meal. 30 capsule 11  . MAGNESIUM-OXIDE 400 (241.3 Mg) MG tablet TAKE 1 TABLET BY MOUTH TWICE DAILY 60 tablet 11  . metFORMIN (GLUCOPHAGE-XR) 500 MG 24 hr tablet TAKE 2 TABLETS BY MOUTH TWICE DAILY WITH MEALS 120 tablet 0  . nitroGLYCERIN (NITROSTAT) 0.4 MG SL tablet Place 0.4 mg under the tongue every 5 (five) minutes as needed for chest pain (x 3 doses). Reported on 05/08/2015    . ONE TOUCH ULTRA TEST test strip USE TO TEST BLOOD SUGAR FOUR TIMES DAILY 250 each 0  . oxyCODONE-acetaminophen (PERCOCET) 10-325 MG tablet Take 1 tablet by mouth every 4 (four) hours as needed for pain.    . potassium chloride SA (K-DUR,KLOR-CON) 20 MEQ tablet Take 20 mEq by mouth 2 (two) times daily.    Marland Kitchen spironolactone (ALDACTONE) 25 MG tablet Take 1 tablet (25 mg total) by mouth 2 (two) times daily. 180 tablet 3  . telmisartan (MICARDIS) 40 MG tablet TAKE 1 TABLET BY MOUTH EVERY DAY 90 tablet 0  . warfarin (COUMADIN) 6 MG tablet TAKE BY MOUTH AS DIRECTED BY ANTICOAGULATION CLINIC (Patient taking differently: 3 mg by mouth at night on Sun/Tues/Thurs/Sat and 6 mg on Mon/Wed/Fri) 135 tablet 0   No current facility-administered medications for this visit.     Allergies:   Metformin and related; Cleocin [clindamycin hcl]; Codeine; Doxycycline hyclate; Macrolides and ketolides; Morphine; Pentazocine lactate; Vibramycin [doxycycline calcium]; Definity [perflutren lipid microsphere]; and Sulfonamide derivatives   Social History:  The patient  reports that she quit smoking about 15 years ago. She has never used smokeless tobacco. She reports that she does not drink alcohol or use  drugs.   Family History:  The patient's  family history includes Colon cancer in her paternal uncle; Heart attack in her father; Heart disease in her father; Stroke in her mother.    ROS:  Please see the history of present illness. All other systems are reviewed and negative.    PHYSICAL EXAM: VS:  BP 118/90   Pulse 88   Ht 5\' 1"  (1.549 m)   Wt 222 lb 1.9 oz (100.8 kg)   BMI 41.97 kg/m  , BMI Body mass index is 41.97 kg/m. GEN: Pleasant obese woman in no acute distress  HEENT: normal  Neck: no JVD, no masses. No carotid bruits Cardiac: RRR without murmur or gallop  Respiratory:  clear to auscultation bilaterally, normal work of breathing GI: soft, nontender, nondistended, + BS MS: no deformity or atrophy  Ext: no pretibial edema, pedal pulses 2+= bilaterally Skin: warm and dry, no rash Neuro:  Strength and sensation are intact Psych: euthymic mood, full affect  EKG:  EKG is ordered today. The ekg ordered today shows NSR 88 bpm, nonspecific IVCD  Recent Labs: 09/01/2015: ALT 15; TSH 1.85 02/05/2016: BUN 16; Creatinine, Ser 0.90; Hemoglobin 14.7; Platelets 323; Potassium 4.3; Sodium 138   Lipid Panel     Component Value Date/Time   CHOL 100 (L) 09/01/2015 1344   TRIG 104 09/01/2015 1344   HDL 37 (L) 09/01/2015 1344   CHOLHDL 2.7 09/01/2015 1344   VLDL 21 09/01/2015 1344   LDLCALC 42 09/01/2015 1344   LDLDIRECT 116.0 11/28/2008 1122      Wt Readings from Last 3 Encounters:  03/30/16 222 lb 1.9 oz (100.8 kg)  03/15/16 218 lb (98.9 kg)  01/26/16 218 lb (98.9 kg)     ASSESSMENT AND PLAN: 1.  Hypertension, uncontrolled: Her medical program is reviewed. I also went back and reviewed her history and various beta blockers that she's taken in the past. She's been on atenolol now for about 4 years. She was previously on metoprolol succinate. This was stopped because of fatigue but there was not any clear association between the 2 and her symptoms haven't really  changed over time. I recommended that she stop atenolol and start back on metoprolol succinate 100 mg daily. If blood pressure remains elevated, would add 50 mg a metoprolol succinate in the evening as a second dose and this can be titrated up to 100 mg twice daily. She should remain on her other antihypertensive medications without change. She will let us know how her blood pressure is doing after a few weeks.  2. Coronary artery disease, native vessel, without angina: She will otherwise continue on her current medications. Patient is on warfarin for anticoagulation, a beta blocker as detailed above, and an ARB.  3. Paroxysmal atrial fibrillation, maintaining sinus rhythm. Tolerating oral anticoagulation with warfarin  4. Hyperlipidemia: treated with atorvastatin. Last lipids reviewed as above. LDL 42.  Current medicines are reviewed with the patient today.  The patient does not have concerns regarding medicines.  Labs/ tests ordered today include:  No orders of the defined types were placed in this encounter.   Disposition:   FU 6 months  Signed, Sherren Mocha, MD  03/30/2016 2:26 PM    [address], [place], [place]  [address] Phone: [phone]; Fax: [phone]

## 2016-03-30 NOTE — Telephone Encounter (Signed)
HOLD. Patient has OV today and this may be changed/LR

## 2016-03-30 NOTE — Telephone Encounter (Signed)
AVS Reports   Date/Time Report Action User  03/30/2016 2:42 PM After Visit Summary Printed Barkley Boards, RN  Patient Instructions   Medication Instructions:  Your physician has recommended you make the following change in your medication:  1. STOP Atenolol 2. START Metoprolol Succinate 100mg  take one tablet by mouth daily in the morning.  Please continue to monitor BP and pulse and if your readings remain elevated please contact the office. We will give further instructions in regards to increasing this medication.

## 2016-03-30 NOTE — Patient Instructions (Signed)
Medication Instructions:  Your physician has recommended you make the following change in your medication:  1. STOP Atenolol 2. START Metoprolol Succinate 100mg  take one tablet by mouth daily in the morning.  Please continue to monitor BP and pulse and if your readings remain elevated please contact the office. We will give further instructions in regards to increasing this medication.   Labwork: No new orders.   Testing/Procedures: No new orders.   Follow-Up: Your physician wants you to follow-up in: 6 MONTHS with Dr Burt Knack.  You will receive a reminder letter in the mail two months in advance. If you don't receive a letter, please call our office to schedule the follow-up appointment.   Any Other Special Instructions Will Be Listed Below (If Applicable).     If you need a refill on your cardiac medications before your next appointment, please call your pharmacy.

## 2016-03-30 NOTE — Telephone Encounter (Signed)
Patient would like to know if Dr. Ronnald Ramp would take her on as a patient.  Please advise.

## 2016-03-31 NOTE — Telephone Encounter (Signed)
appt set

## 2016-03-31 NOTE — Telephone Encounter (Signed)
Yes, I will see her 

## 2016-04-02 ENCOUNTER — Other Ambulatory Visit: Payer: Self-pay | Admitting: Internal Medicine

## 2016-04-04 ENCOUNTER — Other Ambulatory Visit: Payer: Self-pay

## 2016-04-04 DIAGNOSIS — I480 Paroxysmal atrial fibrillation: Secondary | ICD-10-CM

## 2016-04-08 ENCOUNTER — Encounter: Payer: Self-pay | Admitting: Cardiovascular Disease

## 2016-04-08 DIAGNOSIS — M47816 Spondylosis without myelopathy or radiculopathy, lumbar region: Secondary | ICD-10-CM | POA: Diagnosis not present

## 2016-04-08 DIAGNOSIS — G894 Chronic pain syndrome: Secondary | ICD-10-CM | POA: Diagnosis not present

## 2016-04-08 DIAGNOSIS — M150 Primary generalized (osteo)arthritis: Secondary | ICD-10-CM | POA: Diagnosis not present

## 2016-04-08 DIAGNOSIS — Z79891 Long term (current) use of opiate analgesic: Secondary | ICD-10-CM | POA: Diagnosis not present

## 2016-04-09 ENCOUNTER — Other Ambulatory Visit: Payer: Self-pay | Admitting: Internal Medicine

## 2016-04-11 ENCOUNTER — Other Ambulatory Visit: Payer: Self-pay

## 2016-04-11 DIAGNOSIS — I480 Paroxysmal atrial fibrillation: Secondary | ICD-10-CM

## 2016-04-11 NOTE — Progress Notes (Signed)
I spoke with the pt and she is aware of instructions in regards to Metoprolol dosage change. The pt will send BP and pulse readings into the office through My Chart in a few weeks.  She will contact the office if she has any other questions or concerns. The pt will also contact the office when she needs a new Rx sent to the pharmacy.

## 2016-04-14 ENCOUNTER — Ambulatory Visit (INDEPENDENT_AMBULATORY_CARE_PROVIDER_SITE_OTHER): Payer: Medicare Other | Admitting: Internal Medicine

## 2016-04-14 VITALS — BP 118/84 | HR 92 | Temp 98.3°F | Resp 16 | Ht 61.0 in | Wt 218.0 lb

## 2016-04-14 DIAGNOSIS — Z23 Encounter for immunization: Secondary | ICD-10-CM | POA: Diagnosis not present

## 2016-04-14 DIAGNOSIS — E1159 Type 2 diabetes mellitus with other circulatory complications: Secondary | ICD-10-CM | POA: Diagnosis not present

## 2016-04-14 DIAGNOSIS — E1151 Type 2 diabetes mellitus with diabetic peripheral angiopathy without gangrene: Secondary | ICD-10-CM | POA: Diagnosis not present

## 2016-04-14 DIAGNOSIS — E785 Hyperlipidemia, unspecified: Secondary | ICD-10-CM

## 2016-04-14 DIAGNOSIS — E1169 Type 2 diabetes mellitus with other specified complication: Secondary | ICD-10-CM

## 2016-04-14 DIAGNOSIS — I2510 Atherosclerotic heart disease of native coronary artery without angina pectoris: Secondary | ICD-10-CM | POA: Diagnosis not present

## 2016-04-14 DIAGNOSIS — E038 Other specified hypothyroidism: Secondary | ICD-10-CM

## 2016-04-14 DIAGNOSIS — I10 Essential (primary) hypertension: Secondary | ICD-10-CM

## 2016-04-14 DIAGNOSIS — Z1231 Encounter for screening mammogram for malignant neoplasm of breast: Secondary | ICD-10-CM | POA: Insufficient documentation

## 2016-04-14 NOTE — Patient Instructions (Signed)
Hypertension Hypertension, commonly called high blood pressure, is when the force of blood pumping through your arteries is too strong. Your arteries are the blood vessels that carry blood from your heart throughout your body. A blood pressure reading consists of a higher number over a lower number, such as 110/72. The higher number (systolic) is the pressure inside your arteries when your heart pumps. The lower number (diastolic) is the pressure inside your arteries when your heart relaxes. Ideally you want your blood pressure below 120/80. Hypertension forces your heart to work harder to pump blood. Your arteries may become narrow or stiff. Having untreated or uncontrolled hypertension can cause heart attack, stroke, kidney disease, and other problems. What increases the risk? Some risk factors for high blood pressure are controllable. Others are not. Risk factors you cannot control include:  Race. You may be at higher risk if you are African American.  Age. Risk increases with age.  Gender. Men are at higher risk than women before age 45 years. After age 65, women are at higher risk than men. Risk factors you can control include:  Not getting enough exercise or physical activity.  Being overweight.  Getting too much fat, sugar, calories, or salt in your diet.  Drinking too much alcohol. What are the signs or symptoms? Hypertension does not usually cause signs or symptoms. Extremely high blood pressure (hypertensive crisis) may cause headache, anxiety, shortness of breath, and nosebleed. How is this diagnosed? To check if you have hypertension, your health care provider will measure your blood pressure while you are seated, with your arm held at the level of your heart. It should be measured at least twice using the same arm. Certain conditions can cause a difference in blood pressure between your right and left arms. A blood pressure reading that is higher than normal on one occasion does  not mean that you need treatment. If it is not clear whether you have high blood pressure, you may be asked to return on a different day to have your blood pressure checked again. Or, you may be asked to monitor your blood pressure at home for 1 or more weeks. How is this treated? Treating high blood pressure includes making lifestyle changes and possibly taking medicine. Living a healthy lifestyle can help lower high blood pressure. You may need to change some of your habits. Lifestyle changes may include:  Following the DASH diet. This diet is high in fruits, vegetables, and whole grains. It is low in salt, red meat, and added sugars.  Keep your sodium intake below 2,300 mg per day.  Getting at least 30-45 minutes of aerobic exercise at least 4 times per week.  Losing weight if necessary.  Not smoking.  Limiting alcoholic beverages.  Learning ways to reduce stress. Your health care provider may prescribe medicine if lifestyle changes are not enough to get your blood pressure under control, and if one of the following is true:  You are 18-59 years of age and your systolic blood pressure is above 140.  You are 60 years of age or older, and your systolic blood pressure is above 150.  Your diastolic blood pressure is above 90.  You have diabetes, and your systolic blood pressure is over 140 or your diastolic blood pressure is over 90.  You have kidney disease and your blood pressure is above 140/90.  You have heart disease and your blood pressure is above 140/90. Your personal target blood pressure may vary depending on your medical   conditions, your age, and other factors. Follow these instructions at home:  Have your blood pressure rechecked as directed by your health care provider.  Take medicines only as directed by your health care provider. Follow the directions carefully. Blood pressure medicines must be taken as prescribed. The medicine does not work as well when you skip  doses. Skipping doses also puts you at risk for problems.  Do not smoke.  Monitor your blood pressure at home as directed by your health care provider. Contact a health care provider if:  You think you are having a reaction to medicines taken.  You have recurrent headaches or feel dizzy.  You have swelling in your ankles.  You have trouble with your vision. Get help right away if:  You develop a severe headache or confusion.  You have unusual weakness, numbness, or feel faint.  You have severe chest or abdominal pain.  You vomit repeatedly.  You have trouble breathing. This information is not intended to replace advice given to you by your health care provider. Make sure you discuss any questions you have with your health care provider. Document Released: 02/21/2005 Document Revised: 07/30/2015 Document Reviewed: 12/14/2012 Elsevier Interactive Patient Education  2017 [employer]  

## 2016-04-14 NOTE — Progress Notes (Signed)
Subjective:  Patient ID: Kristen [name], female    DOB: 1945-04-22  Age: 71 y.o. MRN: [id number]  CC: Hypertension and Coronary Artery Disease   HPI Enis Boehning presents for establishing as a new primary care patient. She has multiple chronic medical concerns but has been feeling well lately and offers no new symptoms today. She suffers from chronic pain and insomnia.  Outpatient Medications Prior to Visit  Medication Sig Dispense Refill  . ALPRAZolam (XANAX) 1 MG tablet Take 1 tablet (1 mg total) by mouth 3 (three) times daily as needed for anxiety. 90 tablet 4  . atorvastatin (LIPITOR) 40 MG tablet TAKE 1 TABLET BY MOUTH EVERY DAY (Patient taking differently: Take 40 mg by mouth once a day) 90 tablet 0  . cyclobenzaprine (FLEXERIL) 10 MG tablet Take 10 mg by mouth every 8 (eight) hours as needed for muscle spasms.    . furosemide (LASIX) 20 MG tablet TAKE 1 TABLET BY MOUTH DAILY 90 tablet 0  . gabapentin (NEURONTIN) 300 MG capsule TAKE ONE CAPSULE BY MOUTH THREE TIMES DAILY 90 capsule 3  . glipiZIDE (GLUCOTROL) 5 MG tablet TAKE 1 TABLET BY MOUTH DAILY BEFORE BREAKFAST AND 2 TABLETS BEFORE DINNER 90 tablet 0  . Liraglutide (VICTOZA) 18 MG/3ML SOPN Inject 1.8 mg into the skin every morning.     . lubiprostone (AMITIZA) 8 MCG capsule Take 1 capsule (8 mcg total) by mouth 2 (two) times daily with a meal. 30 capsule 11  . MAGNESIUM-OXIDE 400 (241.3 Mg) MG tablet TAKE 1 TABLET BY MOUTH TWICE DAILY 60 tablet 11  . metFORMIN (GLUCOPHAGE-XR) 500 MG 24 hr tablet TAKE 2 TABLETS BY MOUTH TWICE DAILY WITH MEALS 120 tablet 0  . metoprolol succinate (TOPROL-XL) 100 MG 24 hr tablet Take one tablet in the morning and one-half tablet in the evening. Take with or immediately following a meal. 90 tablet 3  . nitroGLYCERIN (NITROSTAT) 0.4 MG SL tablet Place 0.4 mg under the tongue every 5 (five) minutes as needed for chest pain (x 3 doses). Reported on 05/08/2015    . ONE TOUCH ULTRA TEST test  strip USE TO TEST BLOOD SUGAR FOUR TIMES DAILY 250 each 0  . oxyCODONE-acetaminophen (PERCOCET) 10-325 MG tablet Take 1 tablet by mouth every 4 (four) hours as needed for pain.    . potassium chloride SA (K-DUR,KLOR-CON) 20 MEQ tablet Take 20 mEq by mouth 2 (two) times daily.    Marland Kitchen spironolactone (ALDACTONE) 25 MG tablet Take 1 tablet (25 mg total) by mouth 2 (two) times daily. 180 tablet 3  . telmisartan (MICARDIS) 40 MG tablet TAKE 1 TABLET BY MOUTH EVERY DAY 90 tablet 0  . warfarin (COUMADIN) 6 MG tablet TAKE BY MOUTH AS DIRECTED BY ANTICOAGULATION CLINIC (Patient taking differently: 3 mg by mouth at night on Sun/Tues/Thurs/Sat and 6 mg on Mon/Wed/Fri) 135 tablet 0  . acetaminophen (TYLENOL) 500 MG tablet Take 500-1,000 mg by mouth every 6 (six) hours as needed for headache.    . clotrimazole-betamethasone (LOTRISONE) cream Apply 1 application topically daily as needed (for itching). Reported on 05/08/2015    . fluocinonide cream (LIDEX) [id number] % Apply 1 application topically as needed (For itching). Reported on 05/08/2015    . glipiZIDE (GLUCOTROL) 5 MG tablet Take 5 mg by mouth daily before breakfast. Take 10 mg by mouth every evening    . hydrocortisone 2.5 % ointment Apply 1 application topically 2 (two) times daily.   3   No facility-administered medications prior  to visit.     ROS Review of Systems  Constitutional: Negative for chills, diaphoresis, fatigue and unexpected weight change.  HENT: Negative.   Eyes: Negative for visual disturbance.  Respiratory: Negative for cough, chest tightness, shortness of breath and wheezing.   Cardiovascular: Negative for chest pain, palpitations and leg swelling.  Gastrointestinal: Negative for abdominal pain, constipation, diarrhea, nausea and vomiting.  Endocrine: Negative.  Negative for cold intolerance, heat intolerance, polydipsia, polyphagia and polyuria.  Genitourinary: Negative.  Negative for difficulty urinating.  Musculoskeletal: Positive for  arthralgias.  Skin: Negative.   Allergic/Immunologic: Negative.   Neurological: Negative.  Negative for dizziness.  Hematological: Negative.  Negative for adenopathy. Does not bruise/bleed easily.  Psychiatric/Behavioral: Positive for sleep disturbance. Negative for decreased concentration, dysphoric mood and suicidal ideas. The patient is not nervous/anxious.     Objective:  BP 118/84 (BP Location: Left Arm, Patient Position: Sitting, Cuff Size: Large)   Pulse 92   Temp 98.3 F (36.8 C) (Oral)   Resp 16   Ht 5\' 1"  (1.549 m)   Wt 218 lb 0.6 oz (98.9 kg)   SpO2 95%   BMI 41.20 kg/m   BP Readings from Last 3 Encounters:  04/14/16 118/84  03/30/16 118/90  03/15/16 (!) 128/92    Wt Readings from Last 3 Encounters:  04/14/16 218 lb 0.6 oz (98.9 kg)  03/30/16 222 lb 1.9 oz (100.8 kg)  03/15/16 218 lb (98.9 kg)    Physical Exam  Constitutional: She is oriented to person, place, and time. No distress.  HENT:  Mouth/Throat: Oropharynx is clear and moist. No oropharyngeal exudate.  Eyes: Conjunctivae are normal. Right eye exhibits no discharge. Left eye exhibits no discharge. No scleral icterus.  Neck: Normal range of motion. Neck supple. No JVD present. No tracheal deviation present. No thyromegaly present.  Cardiovascular: Normal rate, regular rhythm, normal heart sounds and intact distal pulses.  Exam reveals no gallop and no friction rub.   No murmur heard. Pulmonary/Chest: Effort normal and breath sounds normal. No stridor. No respiratory distress. She has no wheezes. She has no rales. She exhibits no tenderness.  Abdominal: Soft. Bowel sounds are normal. She exhibits no distension and no mass. There is no tenderness. There is no rebound and no guarding.  Musculoskeletal: Normal range of motion. She exhibits no edema, tenderness or deformity.  Lymphadenopathy:    She has no cervical adenopathy.  Neurological: She is oriented to person, place, and time.  Skin: Skin is warm and  dry. No rash noted. She is not diaphoretic. No erythema. No pallor.  Vitals reviewed.   Lab Results  Component Value Date   WBC 6.6 02/05/2016   HGB 14.7 02/05/2016   HCT 42.9 02/05/2016   PLT 323 02/05/2016   GLUCOSE 163 (H) 02/05/2016   CHOL 100 (L) 09/01/2015   TRIG 104 09/01/2015   HDL 37 (L) 09/01/2015   LDLDIRECT 116.0 11/28/2008   LDLCALC 42 09/01/2015   ALT 15 09/01/2015   AST 15 09/01/2015   NA 138 02/05/2016   K 4.3 02/05/2016   CL 106 02/05/2016   CREATININE 0.90 02/05/2016   BUN 16 02/05/2016   CO2 21 (L) 02/05/2016   TSH 1.85 09/01/2015   INR 3.1 (H) 03/16/2016   HGBA1C 8.0 03/15/2016   MICROALBUR 6.5 (H) 07/15/2014    Dg Chest 2 View  Result Date: 02/05/2016 CLINICAL DATA:  Cardiac palpitations with near syncope. Hypertension. EXAM: CHEST  2 VIEW COMPARISON:  January 04, 2016 FINDINGS: Mild linear  atelectasis in the left base is stable. Lungs elsewhere are clear. Heart size and pulmonary vascularity are normal. No adenopathy. There is atherosclerotic calcification in the aorta. Patient is status post median sternotomy. No bone lesions. IMPRESSION: Linear atelectasis left base. No edema or consolidation. Aortic atherosclerosis. Electronically Signed   By: Lowella Grip III M.D.   On: 02/05/2016 16:21    Assessment & Plan:   Nikira was seen today for hypertension and coronary artery disease.  Diagnoses and all orders for this visit:  Atherosclerosis of native coronary artery of native heart without angina pectoris- she has had no symptoms of angina recently, will continue risk factor modifications with blood pressure control, statin therapy, and blood sugar control.  Hypertension associated with diabetes ([place])- her blood pressure is adequately well-controlled.  Hyperlipidemia associated with type 2 diabetes mellitus ([place])- she has achieved her LDL goal is doing well on the statin.  DM (diabetes mellitus), type 2 with peripheral vascular complications  ([place])- her recent A1c was 8%, she is being treated by endocrinology for this.  Other specified hypothyroidism- her last TSH was normal, she is not being treated for this and has no symptoms. Will check her annual TSH in the next few months.  Visit for screening mammogram -     MM DIGITAL SCREENING BILATERAL; Future  Need for diphtheria-tetanus-pertussis (Tdap) vaccine -     Tdap vaccine greater than or equal to 7yo IM   I have discontinued [name] Dick's clotrimazole-betamethasone, fluocinonide cream, hydrocortisone, and acetaminophen. I am also having her maintain her liraglutide, nitroGLYCERIN, spironolactone, atorvastatin, warfarin, ONE TOUCH ULTRA TEST, ALPRAZolam, lubiprostone, oxyCODONE-acetaminophen, cyclobenzaprine, telmisartan, furosemide, MAGNESIUM-OXIDE, gabapentin, potassium chloride SA, glipiZIDE, metFORMIN, and metoprolol succinate.  No orders of the defined types were placed in this encounter.    Follow-up: Return in about 6 months (around 10/12/2016).  Scarlette Calico, MD

## 2016-04-14 NOTE — Progress Notes (Signed)
Pre visit review using our clinic review tool, if applicable. No additional management support is needed unless otherwise documented below in the visit note. 

## 2016-04-15 ENCOUNTER — Encounter: Payer: Self-pay | Admitting: Internal Medicine

## 2016-04-15 ENCOUNTER — Telehealth: Payer: Self-pay | Admitting: Internal Medicine

## 2016-04-15 NOTE — Telephone Encounter (Signed)
Pt request to speak to [facility], she need to schedule new pt appt with you,first time seeing coumadin clinic, Please call her (not sure what all you need from her).

## 2016-04-22 ENCOUNTER — Encounter (HOSPITAL_COMMUNITY): Payer: Self-pay | Admitting: Psychiatry

## 2016-04-22 ENCOUNTER — Ambulatory Visit: Payer: Self-pay

## 2016-04-22 ENCOUNTER — Ambulatory Visit (INDEPENDENT_AMBULATORY_CARE_PROVIDER_SITE_OTHER): Payer: Medicare Other | Admitting: Psychiatry

## 2016-04-22 VITALS — BP 104/62 | HR 95 | Ht 61.0 in | Wt 220.0 lb

## 2016-04-22 DIAGNOSIS — Z90710 Acquired absence of both cervix and uterus: Secondary | ICD-10-CM | POA: Diagnosis not present

## 2016-04-22 DIAGNOSIS — F4323 Adjustment disorder with mixed anxiety and depressed mood: Secondary | ICD-10-CM | POA: Diagnosis not present

## 2016-04-22 DIAGNOSIS — Z9889 Other specified postprocedural states: Secondary | ICD-10-CM

## 2016-04-22 DIAGNOSIS — Z87891 Personal history of nicotine dependence: Secondary | ICD-10-CM

## 2016-04-22 DIAGNOSIS — Z8261 Family history of arthritis: Secondary | ICD-10-CM

## 2016-04-22 DIAGNOSIS — Z8249 Family history of ischemic heart disease and other diseases of the circulatory system: Secondary | ICD-10-CM

## 2016-04-22 DIAGNOSIS — Z833 Family history of diabetes mellitus: Secondary | ICD-10-CM

## 2016-04-22 DIAGNOSIS — Z9049 Acquired absence of other specified parts of digestive tract: Secondary | ICD-10-CM | POA: Diagnosis not present

## 2016-04-22 DIAGNOSIS — Z79891 Long term (current) use of opiate analgesic: Secondary | ICD-10-CM

## 2016-04-22 DIAGNOSIS — Z79899 Other long term (current) drug therapy: Secondary | ICD-10-CM

## 2016-04-22 DIAGNOSIS — Z841 Family history of disorders of kidney and ureter: Secondary | ICD-10-CM

## 2016-04-22 DIAGNOSIS — Z823 Family history of stroke: Secondary | ICD-10-CM

## 2016-04-22 MED ORDER — ALPRAZOLAM 1 MG PO TABS
1.0000 mg | ORAL_TABLET | Freq: Three times a day (TID) | ORAL | 5 refills | Status: DC | PRN
Start: 2016-04-22 — End: 2016-08-24

## 2016-04-22 NOTE — Progress Notes (Signed)
Patient ID: Kristen [name], female   DOB: 1946/01/31, 71 y.o.   MRN: [id number] [facility] MD Progress Note  04/22/2016 12:32 PM Inus Zaccardi  MRN:  [id number] Subjective:  Scared Principal Problem: Adjustment disorder with anxious mood state Today the patient is doing well. She's in good spirits. She denies daily depression. She is enjoying life. She reads watches TV. Patient has some mild issues with her husband has PTSD. But she is learning how more more. The patient denies any psychotic symptoms. She is chronic mild pain seems to be tolerating that pretty well. The patient drinks no alcohol uses no drugs. She's had no falls. She is very reliable about taking her medicines. She's been on this dose of Xanax for decades and does very well. She is focused organized and connected. Her anxiety is minimal. Total Time spent with patient: 30 minutes  Past Psychiatric History:   Past Medical History:  Past Medical History:  Diagnosis Date  . Arthritis   . Blood transfusion without reported diagnosis   . Bursitis   . CAD (coronary artery disease)    a. s/p CABG 2004.b. stable cath 2014 demonstrating stable CAD and continued patency of her LIMA graft.  . Chronic anticoagulation    on coumadin  . Depression   . Diabetes mellitus   . Diastolic dysfunction    per echo in October 2012 with EF 50 to 55%  . Fibromyalgia   . GERD (gastroesophageal reflux disease) 10/23/2003  . Headache(784.0)   . Hyperlipidemia   . Hypertension   . Hypokalemia   . LBBB (left bundle branch block)   . Lumbar back pain   . LV dysfunction    a. EF 45% by cath 2014. b. EF 50-55% by technically difficult echo in 08/2014.  Marland Kitchen Lymphadenitis   . Morbid obesity ([place])    a. Sleep study negative for significant OSA in 11/2014.  Marland Kitchen PAF (paroxysmal atrial fibrillation) ([place])   . Stroke [facility]) 2004   affected speech per pt    Past Surgical History:  Procedure Laterality Date  . ABDOMINAL HYSTERECTOMY    . ANGIOPLASTY   laminectomy  . CHOLECYSTECTOMY    . CORONARY ARTERY BYPASS GRAFT     LIMA to LAD   . KNEE ARTHROSCOPY    . LEFT HEART CATHETERIZATION WITH CORONARY/GRAFT ANGIOGRAM  12/07/2012   Procedure: LEFT HEART CATHETERIZATION WITH Beatrix Fetters;  Surgeon: Blane Ohara, MD;  Location: [facility] CATH LAB;  Service: Cardiovascular;;  . LUMBAR LAMINECTOMY     x3  . SPHINCTEROTOMY    . TONSILLECTOMY     Family History:  Family History  Problem Relation Age of Onset  . Heart attack Father   . Heart disease Father   . Stroke Mother   . Kidney disease    . Stroke    . Arthritis    . Hypertension    . Diabetes    . Colon cancer Paternal Uncle   . Depression Sister   . Anxiety disorder Maternal Aunt    Family Psychiatric  History:  Social History:  History  Alcohol Use No     History  Drug Use No    Social History   Social History  . Marital status: Legally Separated    Spouse name: N/A  . Number of children: N/A  . Years of education: N/A   Social History Main Topics  . Smoking status: Former Smoker    Quit date: 03/07/2001  . Smokeless tobacco: Never Used  .  Alcohol use No  . Drug use: No  . Sexual activity: Not Currently   Other Topics Concern  . None   Social History Narrative   Lives locally, has help available if needed.   Additional Social History:                         Sleep: Fair  Appetite:  Fair  Current Medications: Current Outpatient Prescriptions  Medication Sig Dispense Refill  . ALPRAZolam (XANAX) 1 MG tablet Take 1 tablet (1 mg total) by mouth 3 (three) times daily as needed for anxiety. 90 tablet 5  . atorvastatin (LIPITOR) 40 MG tablet TAKE 1 TABLET BY MOUTH EVERY DAY (Patient taking differently: Take 40 mg by mouth once a day) 90 tablet 0  . cyclobenzaprine (FLEXERIL) 10 MG tablet Take 10 mg by mouth every 8 (eight) hours as needed for muscle spasms.    . furosemide (LASIX) 20 MG tablet TAKE 1 TABLET BY MOUTH DAILY 90 tablet 0  .  gabapentin (NEURONTIN) 300 MG capsule TAKE ONE CAPSULE BY MOUTH THREE TIMES DAILY 90 capsule 3  . glipiZIDE (GLUCOTROL) 5 MG tablet TAKE 1 TABLET BY MOUTH DAILY BEFORE BREAKFAST AND 2 TABLETS BEFORE DINNER 90 tablet 0  . Liraglutide (VICTOZA) 18 MG/3ML SOPN Inject 1.8 mg into the skin every morning.     . lubiprostone (AMITIZA) 8 MCG capsule Take 1 capsule (8 mcg total) by mouth 2 (two) times daily with a meal. 30 capsule 11  . MAGNESIUM-OXIDE 400 (241.3 Mg) MG tablet TAKE 1 TABLET BY MOUTH TWICE DAILY 60 tablet 11  . metFORMIN (GLUCOPHAGE-XR) 500 MG 24 hr tablet TAKE 2 TABLETS BY MOUTH TWICE DAILY WITH MEALS 120 tablet 0  . metoprolol succinate (TOPROL-XL) 100 MG 24 hr tablet Take one tablet in the morning and one-half tablet in the evening. Take with or immediately following a meal. 90 tablet 3  . nitroGLYCERIN (NITROSTAT) 0.4 MG SL tablet Place 0.4 mg under the tongue every 5 (five) minutes as needed for chest pain (x 3 doses). Reported on 05/08/2015    . ONE TOUCH ULTRA TEST test strip USE TO TEST BLOOD SUGAR FOUR TIMES DAILY 250 each 0  . oxyCODONE-acetaminophen (PERCOCET) 10-325 MG tablet Take 1 tablet by mouth every 4 (four) hours as needed for pain.    . potassium chloride SA (K-DUR,KLOR-CON) 20 MEQ tablet Take 20 mEq by mouth 2 (two) times daily.    Marland Kitchen spironolactone (ALDACTONE) 25 MG tablet Take 1 tablet (25 mg total) by mouth 2 (two) times daily. 180 tablet 3  . telmisartan (MICARDIS) 40 MG tablet TAKE 1 TABLET BY MOUTH EVERY DAY 90 tablet 0  . warfarin (COUMADIN) 6 MG tablet TAKE BY MOUTH AS DIRECTED BY ANTICOAGULATION CLINIC (Patient taking differently: 3 mg by mouth at night on Sun/Tues/Thurs/Sat and 6 mg on Mon/Wed/Fri) 135 tablet 0   No current facility-administered medications for this visit.     Lab Results: No results found. However, due to the size of the patient record, not all encounters were searched. Please check Results Review for a complete set of results.  Physical  Findings: AIMS:  , ,  ,  ,    CIWA:    COWS:     Musculoskeletal: Strength & Muscle Tone: within normal limits Gait & Station: normal Patient leans: Right  Psychiatric Specialty Exam: ROS  Blood pressure 104/62, pulse 95, height 5\' 1"  (1.549 m), weight 220 lb (99.8 kg), SpO2 96 %.  Body mass index is 41.57 kg/m.  General Appearance: NA  Eye Contact::  Good  Speech:  Clear and Coherent  Volume:  Normal  Mood:  NA  Affect:  Congruent  Thought Process:  Coherent  Orientation:  Full (Time, Place, and Person)  Thought Content:  WDL  Suicidal Thoughts:  No  Homicidal Thoughts:  No  Memory:  NA  Judgement:  Good  Insight:  Good  Psychomotor Activity:  Normal  Concentration:  Good  Recall:  Good  Fund of Knowledge:Fair  Language: Good  Akathisia:  No  Handed:  Left  AIMS (if indicated):     Assets:  Desire for Improvement  ADL's:  Intact  Cognition: WNL  Sleep:      Treatment Plan Summary:  At this time the patient continue taking Xanax 1 mg 3 times a day. She takes it just as prescribed. Is no evidence of depression or any other psych disorder. The patient is active and energetic functioning extremely well. Today reviewed the pros and cons of her medications and she agreed to take them as prescribed. She is not oversedated she's able to function very well. She is a good ability to think and concentrate and to express her. This patient to return to see me again in 6 months for a 30 minute visit.

## 2016-04-25 ENCOUNTER — Encounter: Payer: Self-pay | Admitting: Cardiovascular Disease

## 2016-04-28 ENCOUNTER — Encounter: Payer: Self-pay | Admitting: Internal Medicine

## 2016-04-28 ENCOUNTER — Encounter: Payer: Self-pay | Admitting: Cardiovascular Disease

## 2016-04-28 ENCOUNTER — Ambulatory Visit (INDEPENDENT_AMBULATORY_CARE_PROVIDER_SITE_OTHER): Payer: Medicare Other | Admitting: Internal Medicine

## 2016-04-28 VITALS — BP 134/94 | HR 96 | Ht 61.0 in | Wt 222.0 lb

## 2016-04-28 DIAGNOSIS — E1151 Type 2 diabetes mellitus with diabetic peripheral angiopathy without gangrene: Secondary | ICD-10-CM | POA: Diagnosis not present

## 2016-04-28 MED ORDER — GLIPIZIDE 5 MG PO TABS: ORAL_TABLET | ORAL | 3 refills | Status: DC

## 2016-04-28 NOTE — Progress Notes (Signed)
Patient ID: Kristen [name], female   DOB: 1945-09-23, 71 y.o.   MRN: [id number]  HPI: Kristen [name] is a 71 y.o.-year-old female, returning for DM2, dx 2008, insulin-dependent, uncontrolled, with complications (CAD, PAD, h/o CVD, peripheral neuropathy). Last visit 5 mo ago.  Last hemoglobin A1c was: Lab Results  Component Value Date   HGBA1C 8.0 03/15/2016   HGBA1C 7.5 10/12/2015   HGBA1C 7.1 07/07/2015  She had a steroid inj.  Pt is on a regimen of: - Victoza 1.8 mg in am - Metformin XR 1000 mg 2x a day - Glipizide 10 mg before dinner and 5 mg daily before breakfast. Stopped Lantus 20 units in 12/2014. We tried NPH. She was on Levemir pen 30 units 2x a day - gets the med directly from [facility] - but had to stop b/c of price. She is now Metformin XR 1000 mg bid b/c N/D and palpitations with regular Metformin - retried with the same results She was on Bydureon in the past, then also on Novolog 60-70-75.  She tried Januvia before.   Pt checked her sugars 2-3x a day - + log - reviewed: - am: 90-126 >> 90-140 >> 87, 109-143 >> 120-130s >> 190-224 >> 118-140, 184 - 2h after b'fast: 135-165, 202 >> 95-155, 161 >> n/c >> 117, 156 >> n/c >> 130-195, 221 - before lunch:93-135 >> 129 >> 104-172, 197, 215 >> 150-160s >> n/c >> 164-201, 220 - 2h after lunch: 111-204 >> 110-169 >> 121, 236 >> 107-199>> n/c >> 170-240, 281 - before dinner:93-147, 175 >> 121-157 >> 114-160, 180, 196 >> n/c >> 197-275 - after dinner:  89-170, 192 >> 150-195, 263 >> 123-203  >> 160-180 >> 348 >> 198-314, 416 - bedtime: 130s >> see above >> 112-168, 208 >> 69-168 >> 96-187 >> 106-184 >> see above >> 177-188 - nighttime: 56, 71-145 >> n/c >> 137 No recent lows;  she has hypoglycemia awareness at 100 >> 87 >> 87 >> 100 >> 118 Highest: 300s - steroid >> 314 x1 (stressed) >> 345x1 >> 348 x5 >> 416 (cake)  Pt does not have chronic kidney disease, last BUN/creatinine was:  Lab Results  Component Value  Date   BUN 16 02/05/2016   CREATININE 0.90 02/05/2016  On Telmisartan. Last set of lipids: Lab Results  Component Value Date   CHOL 100 (L) 09/01/2015   HDL 37 (L) 09/01/2015   LDLCALC 42 09/01/2015   LDLDIRECT 116.0 11/28/2008   TRIG 104 09/01/2015   CHOLHDL 2.7 09/01/2015  On Lipitor. Pt's last eye exam was in 02/2015. Dr. Bing Plume. + hypertensive retinopathy.  Denies numbness and tingling in her legs.   She also has a history of hypertension, paroxysmal A. Fib-on anticoagulation, depression, osteoarthritis - status post arthroscopic knee surgeries x2, status post lumbar laminectomy x3, hyperlipidemia, morbid obesity, chronic lymphadenitis, GERD, hypothyroidism, headaches, fibromyalgia.  She was in ED with palpitations in 02/05/2016. She does have a h/o A fib.   ROS: Constitutional: no fatigue, + weight gain, + hot flashes, no nocturia Eyes: + blurry vision, no xerophthalmia ENT: no sore throat, no nodules palpated in throat, no dysphagia/odynophagia, no hoarseness Cardiovascular: no CP/+ palpitations/+ SOB/+ leg swelling Respiratory: no cough/+ SOB with activity  Gastrointestinal: no vomiting/diarrhea/constipation Musculoskeletal: + muscle aches/+ joint aches Skin: no rashes, no hair loss Neurological: no tremors/numbness/tingling/dizziness, + headaches  I reviewed pt's medications, allergies, PMH, social hx, family hx, and changes were documented in the history of present illness. Otherwise, unchanged from my initial  visit note.   PE: BP (!) 134/94 (BP Location: Left Arm, Patient Position: Sitting)   Pulse 96   Ht 5\' 1"  (1.549 m)   Wt 222 lb (100.7 kg)   SpO2 96%   BMI 41.95 kg/m  Body mass index is 41.95 kg/m. Wt Readings from Last 3 Encounters:  04/28/16 222 lb (100.7 kg)  04/14/16 218 lb 0.6 oz (98.9 kg)  03/30/16 222 lb 1.9 oz (100.8 kg)   Constitutional: overweight, in NAD, full supraclavicular fat pads Eyes: PERRLA, EOMI, no exophthalmos ENT: moist mucous  membranes, no thyromegaly, no cervical lymphadenopathy Cardiovascular: tachycardia, RR, No MRG Respiratory: CTA B Gastrointestinal: abdomen soft, NT, ND, BS+ Musculoskeletal: no deformities, strength intact in all 4 Skin: moist, warm, acanthosis nigricans  ASSESSMENT: 1. DM2, insulin-dependent, uncontrolled, with complications - CAD- Echo 12/2010: EF 50-55%, status post CABG 2004: LIMA to LAD - cards: Dr. Lia Foyer. Also has a LBBB.  - PAD - h/o stroke - peripheral neuropathy - on neurontin  PLAN:  1. Patient with a several-year history of diabetes, previously on basal-bolus regimen, then off all insulin, but sugars started to increase before last visit. We did not change her regimen as she was not checking sugars (new meter Rx sent to her pharmacy) and asked her to come back back in 1.5 mo with her sugar log. We also discussed about improving her diet by including more fruit and veggies.  - at this visit, sugars are still high as she did not change her diet >> we again discussed about how to change this. She would like to try again for 6 weeks. We will increase the Glipizide before b'fast as her sugars start to increase after this meal.  - last HbA1c was higher, at 8% - I advised her to Patient Instructions  Please continue: - Victoza 1.8 mg in am - Metformin XR 1000 mg 2x a day  Please increase: - Glipizide 10 mg before meals  Please get Whole grain breads  Please return in 1.5 months with your sugar log.  - needs a new eye exam - RTC in 1.5 mo  Philemon Kingdom, MD PhD [facility] Endocrinology

## 2016-04-28 NOTE — Patient Instructions (Addendum)
Please continue: - Victoza 1.8 mg in am - Metformin XR 1000 mg 2x a day  Please increase: - Glipizide 10 mg before meals  Please get Whole grain breads  Please return in 1.5 months with your sugar log.

## 2016-04-29 ENCOUNTER — Telehealth: Payer: Self-pay | Admitting: Internal Medicine

## 2016-04-29 ENCOUNTER — Other Ambulatory Visit: Payer: Self-pay

## 2016-04-29 DIAGNOSIS — I480 Paroxysmal atrial fibrillation: Secondary | ICD-10-CM

## 2016-04-29 MED ORDER — METOPROLOL SUCCINATE ER 100 MG PO TB24: 100.0000 mg | ORAL_TABLET | Freq: Two times a day (BID) | ORAL | 3 refills | Status: DC

## 2016-04-29 NOTE — Telephone Encounter (Signed)
Pt wants to know if you can work her in at 1 on 3/2?

## 2016-05-01 ENCOUNTER — Other Ambulatory Visit: Payer: Self-pay | Admitting: Internal Medicine

## 2016-05-02 ENCOUNTER — Telehealth: Payer: Self-pay | Admitting: Cardiovascular Disease

## 2016-05-02 NOTE — Telephone Encounter (Signed)
Received incoming call from pt. Pt stated BP today 187/79 and HR 127 at 10:00 AM. BP now 149/77, HR 101. BP denies SOB or CP. Pt stated she took morning meds at 6:30 AM. Pt stated she recently changed from Atenolol to Toprol XL (pt started Toprol XL on 03/31/16). Pt stated she thinks the Atenolol worked better for her than than Toprol XL. Inquired to see if pt has missed any doses of medication. Pt stated she thinks she missed dose of Toprol XL last, but she is not sure. 04/29/16 BP 134/71, HR 85; 04/30/16 Bp 134/71, HR 85; 05/01/16 BP 146/71, HR 73.  Informed will forward to Dr. Burt Knack to advise.

## 2016-05-02 NOTE — Telephone Encounter (Signed)
New Message  Pt call states she is experiencing tachycardia. Pt states her bp is 187/79 hr 127

## 2016-05-06 ENCOUNTER — Ambulatory Visit: Payer: Self-pay

## 2016-05-06 ENCOUNTER — Ambulatory Visit (INDEPENDENT_AMBULATORY_CARE_PROVIDER_SITE_OTHER): Payer: Medicare Other | Admitting: General Practice

## 2016-05-06 DIAGNOSIS — G894 Chronic pain syndrome: Secondary | ICD-10-CM | POA: Diagnosis not present

## 2016-05-06 DIAGNOSIS — I480 Paroxysmal atrial fibrillation: Secondary | ICD-10-CM

## 2016-05-06 DIAGNOSIS — M47816 Spondylosis without myelopathy or radiculopathy, lumbar region: Secondary | ICD-10-CM | POA: Diagnosis not present

## 2016-05-06 DIAGNOSIS — Z7901 Long term (current) use of anticoagulants: Secondary | ICD-10-CM

## 2016-05-06 DIAGNOSIS — Z5181 Encounter for therapeutic drug level monitoring: Secondary | ICD-10-CM

## 2016-05-06 DIAGNOSIS — Z79891 Long term (current) use of opiate analgesic: Secondary | ICD-10-CM | POA: Diagnosis not present

## 2016-05-06 DIAGNOSIS — M150 Primary generalized (osteo)arthritis: Secondary | ICD-10-CM | POA: Diagnosis not present

## 2016-05-06 LAB — POCT INR: INR: 2.4

## 2016-05-06 NOTE — Progress Notes (Signed)
I have reviewed and agree with the plan. 

## 2016-05-06 NOTE — Patient Instructions (Signed)
Pre visit review using our clinic review tool, if applicable. No additional management support is needed unless otherwise documented below in the visit note. 

## 2016-05-10 ENCOUNTER — Other Ambulatory Visit: Payer: Self-pay | Admitting: Internal Medicine

## 2016-05-11 NOTE — Telephone Encounter (Signed)
I communicated with the pt through My Chart and she is doing well on Toprol with no further issues at this time.   Hi, [place] Souter! I'm so much better! I'm keeping up with my meds... extra carefully. I'm getting used to the evening dose, now!!   Thank you for checking in! Once again, I couldn't have a better doctor and nurse!!  Love you all....  Fraser Din  ----- Message ----- From: Nurse Laureen Abrahams Sent: 05/11/16, 9:25 AM To: [name] Subject: Follow-up on pulse   Berniece Salines,  I just wanted to follow-up and see how you were doing on the Toprol XL.I know you spoke with a triage nurse last week about your pulse being elevated. It looks like you may have missed a dose of Toprol which would have contributed to the elevation.I wanted to just make sure that things have settled down.  Thank you, Ander Purpura

## 2016-05-14 ENCOUNTER — Other Ambulatory Visit: Payer: Self-pay | Admitting: Family Medicine

## 2016-05-14 ENCOUNTER — Other Ambulatory Visit: Payer: Self-pay | Admitting: Internal Medicine

## 2016-05-19 ENCOUNTER — Ambulatory Visit: Payer: Self-pay

## 2016-05-28 ENCOUNTER — Other Ambulatory Visit: Payer: Self-pay | Admitting: Internal Medicine

## 2016-06-01 ENCOUNTER — Other Ambulatory Visit: Payer: Self-pay | Admitting: Family Medicine

## 2016-06-01 NOTE — Telephone Encounter (Signed)
Is this okay to refill? 

## 2016-06-02 ENCOUNTER — Other Ambulatory Visit: Payer: Self-pay | Admitting: Internal Medicine

## 2016-06-02 DIAGNOSIS — Z79891 Long term (current) use of opiate analgesic: Secondary | ICD-10-CM | POA: Diagnosis not present

## 2016-06-02 DIAGNOSIS — G894 Chronic pain syndrome: Secondary | ICD-10-CM | POA: Diagnosis not present

## 2016-06-02 DIAGNOSIS — M47816 Spondylosis without myelopathy or radiculopathy, lumbar region: Secondary | ICD-10-CM | POA: Diagnosis not present

## 2016-06-02 DIAGNOSIS — M150 Primary generalized (osteo)arthritis: Secondary | ICD-10-CM | POA: Diagnosis not present

## 2016-06-05 ENCOUNTER — Other Ambulatory Visit: Payer: Self-pay | Admitting: Internal Medicine

## 2016-06-07 ENCOUNTER — Ambulatory Visit
Admission: RE | Admit: 2016-06-07 | Discharge: 2016-06-07 | Disposition: A | Discharge status: Home / Self Care | Payer: Medicare Other | Source: Ambulatory Visit | Attending: Internal Medicine | Admitting: Internal Medicine

## 2016-06-07 DIAGNOSIS — Z1231 Encounter for screening mammogram for malignant neoplasm of breast: Secondary | ICD-10-CM | POA: Diagnosis not present

## 2016-06-07 LAB — HM MAMMOGRAPHY

## 2016-06-08 DIAGNOSIS — M21612 Bunion of left foot: Secondary | ICD-10-CM | POA: Diagnosis not present

## 2016-06-08 DIAGNOSIS — M21611 Bunion of right foot: Secondary | ICD-10-CM | POA: Diagnosis not present

## 2016-06-08 DIAGNOSIS — E1351 Other specified diabetes mellitus with diabetic peripheral angiopathy without gangrene: Secondary | ICD-10-CM | POA: Diagnosis not present

## 2016-06-10 ENCOUNTER — Ambulatory Visit (INDEPENDENT_AMBULATORY_CARE_PROVIDER_SITE_OTHER): Payer: Medicare Other | Admitting: General Practice

## 2016-06-10 DIAGNOSIS — Z5181 Encounter for therapeutic drug level monitoring: Secondary | ICD-10-CM | POA: Diagnosis not present

## 2016-06-10 DIAGNOSIS — Z7901 Long term (current) use of anticoagulants: Secondary | ICD-10-CM

## 2016-06-10 DIAGNOSIS — I480 Paroxysmal atrial fibrillation: Secondary | ICD-10-CM

## 2016-06-10 LAB — POCT INR: INR: 2.8

## 2016-06-10 NOTE — Progress Notes (Signed)
I have reviewed and agree with the plan. 

## 2016-06-10 NOTE — Patient Instructions (Signed)
Pre visit review using our clinic review tool, if applicable. No additional management support is needed unless otherwise documented below in the visit note. 

## 2016-06-14 ENCOUNTER — Ambulatory Visit (INDEPENDENT_AMBULATORY_CARE_PROVIDER_SITE_OTHER): Payer: Medicare Other | Admitting: Internal Medicine

## 2016-06-14 ENCOUNTER — Encounter: Payer: Self-pay | Admitting: Internal Medicine

## 2016-06-14 VITALS — BP 134/90 | HR 86 | Ht 62.0 in | Wt 220.0 lb

## 2016-06-14 DIAGNOSIS — E1151 Type 2 diabetes mellitus with diabetic peripheral angiopathy without gangrene: Secondary | ICD-10-CM

## 2016-06-14 LAB — POCT GLYCOSYLATED HEMOGLOBIN (HGB A1C): Hemoglobin A1C: 8

## 2016-06-14 MED ORDER — EMPAGLIFLOZIN 25 MG PO TABS: 25.0000 mg | ORAL_TABLET | Freq: Every day | ORAL | 5 refills | Status: DC

## 2016-06-14 NOTE — Addendum Note (Signed)
Addended by: Caprice Beaver T on: 06/14/2016 02:46 PM   Modules accepted: Orders

## 2016-06-14 NOTE — Patient Instructions (Addendum)
Please continue: - Victoza 1.8 mg in am - Metformin XR 1000 mg 2x a day - Glipizide 10 mg before meals  Please add Jardiance 25 mg before breakfast.  Please return in 3 months with your sugar log.

## 2016-06-14 NOTE — Progress Notes (Signed)
Patient ID: Kristen [name], female   DOB: [birth year], 71 y.o.   MRN: [id number]  HPI: Kristen [name] is a 71 y.o.-year-old female, returning for DM2, dx 2008, insulin-dependent, uncontrolled, with complications (CAD, PAD, h/o CVD, peripheral neuropathy). Last visit 1.5 mo ago.  Last hemoglobin A1c was: Lab Results  Component Value Date   HGBA1C 8.0 03/15/2016   HGBA1C 7.5 10/12/2015   HGBA1C 7.1 07/07/2015   Pt is on a regimen of: - Victoza 1.8 mg in am - Metformin XR 1000 mg 2x a day - Glipizide 10 mg before meals Stopped Lantus 20 units in 12/2014. We tried NPH. She was on Levemir pen 30 units 2x a day - gets the med directly from [facility] - but had to stop b/c of price. She is now Metformin XR 1000 mg bid b/c N/D and palpitations with regular Metformin - retried with the same results She was on Bydureon in the past, then also on Novolog 60-70-75.  She tried Januvia before.   Pt checked her sugars 2-3x a day - + log - reviewed: - am:  120-130s >> 190-224 >> 118-140, 184 >> 86, 122-149 - 2h after b'fast:  117, 156 >> n/c >> 130-195, 221 >> 131-177, 215 - before lunch: 150-160s >> n/c >> 164-201, 220 >> 119-156 - 2h after lunch:  107-199>> n/c >> 170-240, 281 >> 136-242, 282  - before dinner:114-160, 180, 196 >> n/c >> 197-275 >> 141-284 - after dinner:  160-180 >> 348 >> 198-314, 416 >> 141, 156-281, 363 - bedtime: 96-187 >> 106-184 >> see above >> 177-188 >> 138, 157-283, 413 (coke) - nighttime: 56, 71-145 >> n/c >> 137 No recent lows;  she has hypoglycemia awareness at 100 >> 87 >> 87 >> 100 >> 118 >> 86 Highest: 300s - steroid >> 314 x1 (stressed) >> 345x1 >> 348 x5 >> 416 (cake) >> 413  Last BUN/creatinine was:  Lab Results  Component Value Date   BUN 16 02/05/2016   CREATININE 0.90 02/05/2016  On Telmisartan. Last set of lipids: Lab Results  Component Value Date   CHOL 100 (L) 09/01/2015   HDL 37 (L) 09/01/2015   LDLCALC 42 09/01/2015   LDLDIRECT  116.0 11/28/2008   TRIG 104 09/01/2015   CHOLHDL 2.7 09/01/2015  On Lipitor. Pt's last eye exam was in 2017. Dr. Bing Plume. + hypertensive retinopathy.  Denies numbness and tingling in her legs.   She also has a history of hypertension, paroxysmal A. Fib-on anticoagulation, depression, osteoarthritis - status post arthroscopic knee surgeries x2, status post lumbar laminectomy x3, hyperlipidemia, morbid obesity, chronic lymphadenitis, GERD, hypothyroidism, headaches, fibromyalgia.  ROS: Constitutional: no fatigue, no weight gain and loss, + hot flashes, + occas. nocturia Eyes: + blurry vision (with high sugars), no xerophthalmia ENT: no sore throat, no nodules palpated in throat, + dysphagia/no odynophagia, no hoarseness Cardiovascular: no CP/+ palpitations/+SOB/no leg swelling Respiratory: no cough/+ SOB Gastrointestinal: no vomiting/diarrhea/constipation Musculoskeletal: + muscle aches/+ joint aches Skin: no rashes, no hair loss Neurological: no tremors/numbness/tingling/dizziness, no headaches  I reviewed pt's medications, allergies, PMH, social hx, family hx, and changes were documented in the history of present illness. Otherwise, unchanged from my initial visit note.   PE: BP 134/90 (BP Location: Left Arm, Patient Position: Sitting)   Pulse 86   Ht 5\' 2"  (1.575 m)   Wt 220 lb (99.8 kg)   SpO2 97%   BMI 40.24 kg/m  Body mass index is 40.24 kg/m. Wt Readings from Last 3 Encounters:  06/14/16 220 lb (99.8 kg)  04/28/16 222 lb (100.7 kg)  04/14/16 218 lb 0.6 oz (98.9 kg)   Constitutional: overweight, in NAD, full supraclavicular fat pads Eyes: PERRLA, EOMI, no exophthalmos ENT: moist mucous membranes, no thyromegaly, no cervical lymphadenopathy Cardiovascular: RRR, No MRG Respiratory: CTA B Gastrointestinal: abdomen soft, NT, ND, BS+ Musculoskeletal: no deformities, strength intact in all 4 Skin: moist, warm, acanthosis nigricans  ASSESSMENT: 1. DM2, insulin-dependent,  uncontrolled, with complications - CAD- Echo 12/2010: EF 50-55%, status post CABG 2004: LIMA to LAD - cards: Dr. Lia Foyer. Also has a LBBB.  - PAD - h/o stroke - peripheral neuropathy - on neurontin  PLAN:  1. Patient with a several-year history of diabetes, previously on basal-bolus regimen, then off all insulin, but sugars started to increase. At last visit, we discussed about improving diet >> she was not able to improve this >> sugars are still high >> will add Jardance.  - we discussed about SEs of Jardiance, which are: dizziness (advised to be careful when stands from sitting position), decreased BP - usually not < normal (BP today is not low), and fungal UTIs (advised to let me know if develops one).  - last HbA1c was higher, at 8% >> today: stable, 8% - I advised her to Patient Instructions  Please continue: - Victoza 1.8 mg in am - Metformin XR 1000 mg 2x a day - Glipizide 10 mg before meals  Please add Jardiance 25 mg before breakfast.  Please return in 3 months with your sugar log.  - needs a new eye exam >> again advised to schedule - RTC in 3 mo >> needs a BMP then  Philemon Kingdom, MD PhD [facility]

## 2016-06-15 ENCOUNTER — Telehealth: Payer: Self-pay | Admitting: Physician Assistant

## 2016-06-15 NOTE — Telephone Encounter (Signed)
Paged by the patient regarding "feeling weird" today after taking the 1st dose of her new diabetes medication, Empagliflozin. Her SBP went up to 180s, her HR went up to 100s. However after a dose of benzo and some resting, her BP is now backing down toward normal and HR is in the 80s. She is not having swelling of the tongue or SOB. I asked her to monitor it for now. If symptom occurs again with tomorrow's dose of this new medication, then stop and check with Dr. Cruzita Lederer to see if alternative medication is available.   Hilbert Corrigan PA Pager: [phone]

## 2016-06-16 ENCOUNTER — Telehealth: Payer: Self-pay | Admitting: Cardiovascular Disease

## 2016-06-16 ENCOUNTER — Telehealth: Payer: Self-pay

## 2016-06-16 ENCOUNTER — Encounter: Payer: Self-pay | Admitting: Cardiovascular Disease

## 2016-06-16 NOTE — Telephone Encounter (Signed)
Agree thanks. No changes recommended.

## 2016-06-16 NOTE — Telephone Encounter (Signed)
New message     Please call Pt c/o BP issue: STAT if pt c/o blurred vision, one-sided weakness or slurred speech  1. What are your last 5 BP readings? 184/107 99 hr , 168/102  103 hr,  160/85  2. Are you having any other symptoms (ex. Dizziness, headache, blurred vision, passed out)? Lightheaded   3. What is your BP issue?  She is concerned about this being high

## 2016-06-16 NOTE — Telephone Encounter (Signed)
I called the pt and she wanted to make [place] aware that she had spoken with a PA on call (note in chart) last night about her BP and pulse.  Today the pt states that she just does not feel well and is tired and has not gotten dressed or gone out of the house.  Most recent BP 121/67, pulse 94.  The pt has taken jardiance today.  The pt would like Dr Antionette Char thoughts about her pulse and whether or not additional changes should be made in her medications.  I will forward a note to Dr Burt Knack for review.

## 2016-06-16 NOTE — Telephone Encounter (Signed)
Kristen [name], can you please check on her later today?

## 2016-06-16 NOTE — Telephone Encounter (Signed)
Called and spoke with patient regarding the issues she was having regarding the medication. Patient stated she was much better today. MD notified.

## 2016-06-16 NOTE — Telephone Encounter (Signed)
Left message on pt's voicemail that no changes recommended at this time.  Continue with BP and pulse monitoring. Advised pt to contact the office with any further questions or concerns.

## 2016-06-16 NOTE — Telephone Encounter (Signed)
Called patient, she states she is much better today. She started the medication in the mornings and it has helped her a lot, her sugars are getting better as well.

## 2016-06-18 ENCOUNTER — Other Ambulatory Visit: Payer: Self-pay | Admitting: Internal Medicine

## 2016-06-21 ENCOUNTER — Other Ambulatory Visit: Payer: Self-pay | Admitting: Internal Medicine

## 2016-06-26 ENCOUNTER — Other Ambulatory Visit: Payer: Self-pay | Admitting: Internal Medicine

## 2016-06-29 DIAGNOSIS — M47816 Spondylosis without myelopathy or radiculopathy, lumbar region: Secondary | ICD-10-CM | POA: Diagnosis not present

## 2016-06-29 DIAGNOSIS — G894 Chronic pain syndrome: Secondary | ICD-10-CM | POA: Diagnosis not present

## 2016-06-29 DIAGNOSIS — Z79891 Long term (current) use of opiate analgesic: Secondary | ICD-10-CM | POA: Diagnosis not present

## 2016-06-29 DIAGNOSIS — M150 Primary generalized (osteo)arthritis: Secondary | ICD-10-CM | POA: Diagnosis not present

## 2016-06-30 ENCOUNTER — Telehealth: Payer: Self-pay | Admitting: Nurse Practitioner

## 2016-06-30 NOTE — Telephone Encounter (Signed)
   Pt called to report that she has been lightheaded and hr's have ranged between 90's and low 100's.  She does have a h/o afib.  She is currently in [place] in hopes of attending a concert but has now decided to forego the show and her son is on his way to [place] to get her.  I advised that if symptoms are mild and bp stable (she will check when she gets home), she may take an additional 50 mg of toprol (takes 100 bid).  If she remains lightheaded and tachycardic in the am, she should call into the office for ECG tomorrow.  If symptoms worsen, she will come to the ED tonight.  She is chronically anticoagulated with warfarin.  Caller verbalized understanding and was grateful for the call back.  Murray Hodgkins, NP 06/30/2016, 7:39 PM

## 2016-07-01 ENCOUNTER — Encounter: Payer: Self-pay | Admitting: Cardiovascular Disease

## 2016-07-03 ENCOUNTER — Other Ambulatory Visit: Payer: Self-pay | Admitting: Internal Medicine

## 2016-07-04 ENCOUNTER — Other Ambulatory Visit: Payer: Self-pay

## 2016-07-04 ENCOUNTER — Telehealth: Payer: Self-pay | Admitting: Cardiovascular Disease

## 2016-07-04 DIAGNOSIS — I480 Paroxysmal atrial fibrillation: Secondary | ICD-10-CM

## 2016-07-04 NOTE — Telephone Encounter (Signed)
New Message     Pt would like to speak to you about what happened this weekend, and she needs a letter staying that she was told to come home instead of staying at the concert, so she has documentation

## 2016-07-04 NOTE — Telephone Encounter (Signed)
I spoke with the pt and she has insurance on the concert tickets from last week. The pt spoke with Ignacia Bayley NP after hours and she would like a note to turn in to get her money back.  The pt is coming into the office on 07/07/16 for event monitor placement and would like to pick-up the letter at that time. I will forward this information to [place].

## 2016-07-05 ENCOUNTER — Encounter: Payer: Self-pay | Admitting: Nurse Practitioner

## 2016-07-05 NOTE — Progress Notes (Signed)
  [facility]. [address] [place], [place] [address]    Re: Kristen [name]  DOB: [birth year]    To whom it may concern,  [name] developed a medical condition on the evening of 06/30/2016 that prevented her from a concert that she had planned to attend.  This also resulted in cancellation of hotel reservations.  [name] was in contact with me that evening and based on her symptoms, I advised that she should return home or present to the Emergency Room if symptoms worsened.  Sincerely,    Murray Hodgkins, NP

## 2016-07-06 NOTE — Telephone Encounter (Signed)
Letter placed at the front desk for pick up.

## 2016-07-07 ENCOUNTER — Ambulatory Visit (INDEPENDENT_AMBULATORY_CARE_PROVIDER_SITE_OTHER): Payer: Medicare Other

## 2016-07-07 DIAGNOSIS — I480 Paroxysmal atrial fibrillation: Secondary | ICD-10-CM | POA: Diagnosis not present

## 2016-07-08 ENCOUNTER — Ambulatory Visit: Payer: Medicare Other

## 2016-07-12 ENCOUNTER — Other Ambulatory Visit: Payer: Self-pay | Admitting: Family Medicine

## 2016-07-12 ENCOUNTER — Ambulatory Visit: Payer: Self-pay

## 2016-07-12 ENCOUNTER — Telehealth: Payer: Self-pay | Admitting: Cardiovascular Disease

## 2016-07-12 NOTE — Telephone Encounter (Signed)
Spoke with patient who c/o an episode of waking up to feeling hot and lightheaded which prompted her to check her pulse with a pulse oximeter (HR 108) and then to check her BP (178/74). Pt stated she felt heaviness in her chest when she is having episodes of light headedness and dizziness. 10 AM: BP 178/74, HR 103, 10:55 AM(while on phone): BP 154/79,HR 96. She stated she sometimes has episodes where she feels heaviness in her chest comparing it to an anxiety attack. At the time of call (10:55 am) patient denied any symptoms and stated she felt okay while talking to me. Pt said on June 30, 2016 she had an episode where she felt light headed and dizzy and her heart rate was up. Advised patient to keep her follow up appointment on 10/07/2016 with Dr. Burt Knack. I told patient I would send Dr. Burt Knack a message about the symptoms she is experiencing to see if he has any further recommendations. Advised pt to monitor her BP and HR and to call back with any questions or concerns. Pt thanked me for my call.

## 2016-07-12 NOTE — Telephone Encounter (Signed)
New Message:    Pt's heart rate have been going up and down in the last 2o minutes while she was laying down. It went as high as 108 and now it is 91 and 92.Pt has a fullness in her chest and lightheaded.

## 2016-07-13 ENCOUNTER — Other Ambulatory Visit: Payer: Self-pay | Admitting: Internal Medicine

## 2016-07-13 ENCOUNTER — Ambulatory Visit (INDEPENDENT_AMBULATORY_CARE_PROVIDER_SITE_OTHER): Payer: Medicare Other | Admitting: General Practice

## 2016-07-13 DIAGNOSIS — Z5181 Encounter for therapeutic drug level monitoring: Secondary | ICD-10-CM | POA: Diagnosis not present

## 2016-07-13 DIAGNOSIS — I480 Paroxysmal atrial fibrillation: Secondary | ICD-10-CM

## 2016-07-13 DIAGNOSIS — Z7901 Long term (current) use of anticoagulants: Secondary | ICD-10-CM

## 2016-07-13 LAB — POCT INR: INR: 2.4

## 2016-07-13 NOTE — Patient Instructions (Signed)
Pre visit review using our clinic review tool, if applicable. No additional management support is needed unless otherwise documented below in the visit note. 

## 2016-07-13 NOTE — Progress Notes (Signed)
I have reviewed and agree with the plan. 

## 2016-07-18 ENCOUNTER — Ambulatory Visit: Payer: Self-pay

## 2016-07-18 ENCOUNTER — Encounter: Payer: Self-pay | Admitting: Cardiovascular Disease

## 2016-07-18 NOTE — Telephone Encounter (Signed)
I spoke with the pt and she has multiple issues with losses of family and friends and feels like she is having more anxiety. The pt follows with Dr Casimiro Needle and is seen every 3-4 months. I advised her to reach out to Dr Casimiro Needle to discuss her issues with increased anxiety. The pt also wonders if Jardiance could be contributing to symptoms.  I advised her to contact Dr Cruzita Lederer to discuss if this medication can cause increased palpitations, BP and pulse fluctuations and increased anxiety. At this time the pt will continue to wear monitor and we will follow-up with results once monitor is complete. Pt agreed with plan and will contact the office with any additional questions or concerns.

## 2016-07-18 NOTE — Telephone Encounter (Signed)
Continue wearing event monitor. Will review tracings to evaluate for arrhythmia. No medicine changes at this point.

## 2016-07-26 ENCOUNTER — Other Ambulatory Visit: Payer: Self-pay | Admitting: Internal Medicine

## 2016-07-27 DIAGNOSIS — Z79891 Long term (current) use of opiate analgesic: Secondary | ICD-10-CM | POA: Diagnosis not present

## 2016-07-27 DIAGNOSIS — M150 Primary generalized (osteo)arthritis: Secondary | ICD-10-CM | POA: Diagnosis not present

## 2016-07-27 DIAGNOSIS — M47816 Spondylosis without myelopathy or radiculopathy, lumbar region: Secondary | ICD-10-CM | POA: Diagnosis not present

## 2016-07-27 DIAGNOSIS — G894 Chronic pain syndrome: Secondary | ICD-10-CM | POA: Diagnosis not present

## 2016-08-03 ENCOUNTER — Other Ambulatory Visit: Payer: Self-pay | Admitting: Family Medicine

## 2016-08-03 ENCOUNTER — Other Ambulatory Visit: Payer: Self-pay | Admitting: Internal Medicine

## 2016-08-03 NOTE — Telephone Encounter (Signed)
Is this okay to refill? 

## 2016-08-04 ENCOUNTER — Ambulatory Visit (INDEPENDENT_AMBULATORY_CARE_PROVIDER_SITE_OTHER): Payer: Medicare Other | Admitting: Internal Medicine

## 2016-08-04 ENCOUNTER — Other Ambulatory Visit: Payer: Medicare Other

## 2016-08-04 ENCOUNTER — Encounter: Payer: Self-pay | Admitting: Internal Medicine

## 2016-08-04 VITALS — BP 108/80 | HR 96 | Temp 98.0°F | Resp 16 | Ht 62.0 in | Wt 217.0 lb

## 2016-08-04 DIAGNOSIS — G8929 Other chronic pain: Secondary | ICD-10-CM

## 2016-08-04 DIAGNOSIS — R300 Dysuria: Secondary | ICD-10-CM

## 2016-08-04 DIAGNOSIS — G4701 Insomnia due to medical condition: Secondary | ICD-10-CM

## 2016-08-04 LAB — POC URINALSYSI DIPSTICK (AUTOMATED)
Bilirubin, UA: NEGATIVE
GLUCOSE UA: POSITIVE
Ketones, UA: NEGATIVE
NITRITE UA: NEGATIVE
Protein, UA: NEGATIVE
RBC UA: NEGATIVE
Spec Grav, UA: 1.02 (ref 1.010–1.025)
UROBILINOGEN UA: 0.2 U/dL
pH, UA: 6 (ref 5.0–8.0)

## 2016-08-04 MED ORDER — SUVOREXANT 15 MG PO TABS: 1.0000 | ORAL_TABLET | Freq: Every day | ORAL | 5 refills | Status: DC

## 2016-08-04 NOTE — Progress Notes (Signed)
Subjective:  Patient ID: Kristen [name], female    DOB: Jul 20, 1945  Age: 71 y.o. MRN: [id number]  CC: Back Pain and Urinary Tract Infection   HPI Kristen [name] presents for The complaint of urinary urgency for about 2 weeks. She denies dysuria, frequency, abdominal pain, hematuria, fever, chills, or pelvic pain. She also complains of chronic insomnia that is related to chronic back pain. She has tried Ambien before but she said it made her too groggy. She has difficulty falling asleep as well as frequent awakenings.  Outpatient Medications Prior to Visit  Medication Sig Dispense Refill  . ALPRAZolam (XANAX) 1 MG tablet Take 1 tablet (1 mg total) by mouth 3 (three) times daily as needed for anxiety. 90 tablet 5  . atorvastatin (LIPITOR) 40 MG tablet TAKE 1 TABLET BY MOUTH DAILY 90 tablet 0  . cyclobenzaprine (FLEXERIL) 10 MG tablet Take 10 mg by mouth every 8 (eight) hours as needed for muscle spasms.    . empagliflozin (JARDIANCE) 25 MG TABS tablet Take 25 mg by mouth daily. 30 tablet 5  . furosemide (LASIX) 20 MG tablet TAKE 1 TABLET BY MOUTH DAILY 90 tablet 0  . gabapentin (NEURONTIN) 300 MG capsule TAKE ONE CAPSULE BY MOUTH THREE TIMES DAILY 90 capsule 3  . glipiZIDE (GLUCOTROL) 5 MG tablet TAKE 2 TABLET BY MOUTH DAILY BEFORE BREAKFAST AND 2 TABLETS BEFORE DINNER 120 tablet 3  . Liraglutide (VICTOZA) 18 MG/3ML SOPN Inject 1.8 mg into the skin every morning.     . lubiprostone (AMITIZA) 8 MCG capsule Take 1 capsule (8 mcg total) by mouth 2 (two) times daily with a meal. 30 capsule 11  . MAGNESIUM-OXIDE 400 (241.3 Mg) MG tablet TAKE 1 TABLET BY MOUTH TWICE DAILY 60 tablet 11  . metFORMIN (GLUCOPHAGE-XR) 500 MG 24 hr tablet TAKE 2 TABLETS BY MOUTH TWICE DAILY WITH MEALS 120 tablet 0  . metoprolol succinate (TOPROL-XL) 100 MG 24 hr tablet Take 1 tablet (100 mg total) by mouth 2 (two) times daily. 180 tablet 3  . ONE TOUCH ULTRA TEST test strip USE TO TEST BLOOD SUGAR FOUR TIMES  DAILY AS DIRECTED 250 each 2  . oxyCODONE-acetaminophen (PERCOCET) 10-325 MG tablet Take 1 tablet by mouth every 4 (four) hours as needed for pain.    . potassium chloride SA (K-DUR,KLOR-CON) 20 MEQ tablet Take 20 mEq by mouth 2 (two) times daily.    Marland Kitchen spironolactone (ALDACTONE) 25 MG tablet TAKE 1 TABLET BY MOUTH TWICE DAILY 180 tablet 0  . telmisartan (MICARDIS) 40 MG tablet TAKE 1 TABLET BY MOUTH EVERY DAY 90 tablet 0  . warfarin (COUMADIN) 6 MG tablet TAKE BY MOUTH AS DIRECTED BY ANTICOAGULATION CLINIC 135 tablet 0  . nitroGLYCERIN (NITROSTAT) 0.4 MG SL tablet Place 0.4 mg under the tongue every 5 (five) minutes as needed for chest pain (x 3 doses). Reported on 05/08/2015    . atorvastatin (LIPITOR) 40 MG tablet TAKE 1 TABLET BY MOUTH EVERY DAY (Patient taking differently: Take 40 mg by mouth once a day) 90 tablet 0  . glipiZIDE (GLUCOTROL) 5 MG tablet TAKE 1 TABLET BY MOUTH DAILY BEFORE BREAKFAST AND 2 TABLETS BEFORE DINNER 90 tablet 0   No facility-administered medications prior to visit.     ROS Review of Systems  Constitutional: Negative for chills, fatigue and unexpected weight change.  HENT: Negative.   Eyes: Negative for visual disturbance.  Respiratory: Negative for cough, chest tightness and wheezing.   Cardiovascular: Negative for chest pain,  palpitations and leg swelling.  Gastrointestinal: Negative for abdominal pain, constipation, diarrhea, nausea and vomiting.  Genitourinary: Positive for urgency. Negative for difficulty urinating, dysuria, flank pain, frequency and hematuria.  Musculoskeletal: Positive for back pain. Negative for myalgias.       She has chronic, nonradiating low back pain and she is status post 3 surgical procedures.  Skin: Negative.   Hematological: Negative.  Negative for adenopathy. Does not bruise/bleed easily.  Psychiatric/Behavioral: Positive for sleep disturbance. The patient is not nervous/anxious.     Objective:  BP 108/80 (BP Location: Left  Arm, Patient Position: Sitting, Cuff Size: Large)   Pulse 96   Temp 98 F (36.7 C) (Oral)   Resp 16   Ht 5\' 2"  (1.575 m)   Wt 217 lb (98.4 kg)   SpO2 96%   BMI 39.69 kg/m   BP Readings from Last 3 Encounters:  08/04/16 108/80  06/14/16 134/90  04/28/16 (!) 134/94    Wt Readings from Last 3 Encounters:  08/04/16 217 lb (98.4 kg)  06/14/16 220 lb (99.8 kg)  04/28/16 222 lb (100.7 kg)    Physical Exam  Constitutional: She is oriented to person, place, and time. No distress.  HENT:  Mouth/Throat: Oropharynx is clear and moist. No oropharyngeal exudate.  Eyes: Conjunctivae are normal. Right eye exhibits no discharge. Left eye exhibits no discharge. No scleral icterus.  Neck: Normal range of motion. Neck supple. No JVD present. No tracheal deviation present. No thyromegaly present.  Cardiovascular: Normal rate and regular rhythm.   No murmur heard. Pulmonary/Chest: Effort normal and breath sounds normal. No stridor. No respiratory distress. She has no wheezes. She has no rales. She exhibits no tenderness.  Abdominal: Soft. Bowel sounds are normal. She exhibits no distension and no mass. There is no tenderness. There is no rebound and no guarding.  Musculoskeletal: Normal range of motion. She exhibits no edema or tenderness.  Lymphadenopathy:    She has no cervical adenopathy.  Neurological: She is oriented to person, place, and time.  Skin: Skin is warm and dry. No rash noted. She is not diaphoretic. No erythema. No pallor.  Psychiatric: She has a normal mood and affect. Her behavior is normal. Judgment and thought content normal.  Vitals reviewed.   Lab Results  Component Value Date   WBC 6.6 02/05/2016   HGB 14.7 02/05/2016   HCT 42.9 02/05/2016   PLT 323 02/05/2016   GLUCOSE 163 (H) 02/05/2016   CHOL 100 (L) 09/01/2015   TRIG 104 09/01/2015   HDL 37 (L) 09/01/2015   LDLDIRECT 116.0 11/28/2008   LDLCALC 42 09/01/2015   ALT 15 09/01/2015   AST 15 09/01/2015   NA 138  02/05/2016   K 4.3 02/05/2016   CL 106 02/05/2016   CREATININE 0.90 02/05/2016   BUN 16 02/05/2016   CO2 21 (L) 02/05/2016   TSH 1.85 09/01/2015   INR 2.4 07/13/2016   HGBA1C 8.0 06/14/2016   MICROALBUR 6.5 (H) 07/15/2014    Mm Digital Screening Bilateral  Result Date: 06/07/2016 CLINICAL DATA:  Screening. EXAM: DIGITAL SCREENING BILATERAL MAMMOGRAM WITH CAD COMPARISON:  Previous exam(s). ACR Breast Density Category b: There are scattered areas of fibroglandular density. FINDINGS: There are no findings suspicious for malignancy. Images were processed with CAD. IMPRESSION: No mammographic evidence of malignancy. A result letter of this screening mammogram will be mailed directly to the patient. RECOMMENDATION: Screening mammogram in one year. (Code:SM-B-01Y) BI-RADS CATEGORY  1: Negative. Electronically Signed   By: Sharyn Lull  Theda Sers M.D.   On: 06/07/2016 15:02    Assessment & Plan:   Edelmira was seen today for back pain and urinary tract infection.  Diagnoses and all orders for this visit:  Dysuria- her dipstick UA is positive for glucose and 1+ leukocytes but negative for nitrites, her urine culture is positive for 3 different organisms at about 50,000 copies so I don't think she has a urinary tract infection but her urinary urgency is probably related to the glucosuria. She will let me know if she develops any worsening symptoms then will consider whether or not antibiotic therapy is indicated. -     POCT Urinalysis Dipstick (Automated) -     CULTURE, URINE COMPREHENSIVE; Future  Insomnia secondary to chronic pain- will try Belsomra to treat this. -     Suvorexant (BELSOMRA) 15 MG TABS; Take 1 tablet by mouth at bedtime.   I am having [name] Dick start on [employer]. I am also having her maintain her liraglutide, nitroGLYCERIN, lubiprostone, oxyCODONE-acetaminophen, cyclobenzaprine, MAGNESIUM-OXIDE, gabapentin, potassium chloride SA, ALPRAZolam, glipiZIDE, metoprolol succinate,  telmisartan, furosemide, empagliflozin, warfarin, spironolactone, atorvastatin, ONE TOUCH ULTRA TEST, and metFORMIN.  Meds ordered this encounter  Medications  . Suvorexant (BELSOMRA) 15 MG TABS    Sig: Take 1 tablet by mouth at bedtime.    Dispense:  30 tablet    Refill:  5     Follow-up: Return in about 6 weeks (around 09/15/2016).  Scarlette Calico, MD

## 2016-08-04 NOTE — Patient Instructions (Signed)

## 2016-08-06 ENCOUNTER — Encounter: Payer: Self-pay | Admitting: Internal Medicine

## 2016-08-06 LAB — CULTURE, URINE COMPREHENSIVE

## 2016-08-24 ENCOUNTER — Other Ambulatory Visit: Payer: Self-pay | Admitting: Internal Medicine

## 2016-08-24 ENCOUNTER — Encounter (HOSPITAL_COMMUNITY): Payer: Self-pay | Admitting: Psychiatry

## 2016-08-24 ENCOUNTER — Ambulatory Visit (INDEPENDENT_AMBULATORY_CARE_PROVIDER_SITE_OTHER): Payer: Medicare Other | Admitting: Psychiatry

## 2016-08-24 VITALS — BP 132/80 | HR 74 | Ht 61.0 in | Wt 214.0 lb

## 2016-08-24 DIAGNOSIS — Z818 Family history of other mental and behavioral disorders: Secondary | ICD-10-CM

## 2016-08-24 DIAGNOSIS — R002 Palpitations: Secondary | ICD-10-CM

## 2016-08-24 DIAGNOSIS — Z79899 Other long term (current) drug therapy: Secondary | ICD-10-CM | POA: Diagnosis not present

## 2016-08-24 DIAGNOSIS — F4322 Adjustment disorder with anxiety: Secondary | ICD-10-CM | POA: Diagnosis not present

## 2016-08-24 DIAGNOSIS — F4323 Adjustment disorder with mixed anxiety and depressed mood: Secondary | ICD-10-CM

## 2016-08-24 DIAGNOSIS — Z7901 Long term (current) use of anticoagulants: Secondary | ICD-10-CM

## 2016-08-24 DIAGNOSIS — Z87891 Personal history of nicotine dependence: Secondary | ICD-10-CM | POA: Diagnosis not present

## 2016-08-24 DIAGNOSIS — Z7984 Long term (current) use of oral hypoglycemic drugs: Secondary | ICD-10-CM

## 2016-08-24 MED ORDER — ALPRAZOLAM 1 MG PO TABS: 1.0000 mg | ORAL_TABLET | Freq: Three times a day (TID) | ORAL | 5 refills | Status: DC | PRN

## 2016-08-24 NOTE — Progress Notes (Signed)
Patient ID: Kristen [name], female   DOB: January 13, 1946, 71 y.o.   MRN: [id number] [facility] MD Progress Note  08/24/2016 1:39 PM Kristen [name]  MRN:  [id number] Subjective:  Scared Principal Problem: Adjustment disorder with anxious mood state Today the patient is doing very well. She has a chronic conflict with her husband. He treats the world differently he treats her. But overall she loves and still involved. The patient dislikes the home she lives in. She says it's gone downhill. Department life and the owner isn't taking a good job taking care of it. The patient has few other close friends but she is close with her family. His manger brother and her son. The patient denies daily depression. She has no other vegetative symptoms. She uses no alcohol uses no drugs. She is very stable. Is very friendly and engaging. She had some cardiac issues and saw cardiologist but nothing was clearly defined. At this time she denies chest pain or shortness of breath. She is having palpitations but that is not present now. The patient is happy with the way life is at this time.  Past Psychiatric History:   Past Medical History:  Past Medical History:  Diagnosis Date  . Arthritis   . Blood transfusion without reported diagnosis   . Bursitis   . CAD (coronary artery disease)    a. s/p CABG 2004.b. stable cath 2014 demonstrating stable CAD and continued patency of her LIMA graft.  . Chronic anticoagulation    on coumadin  . Depression   . Diabetes mellitus   . Diastolic dysfunction    per echo in October 2012 with EF 50 to 55%  . Fibromyalgia   . GERD (gastroesophageal reflux disease) 10/23/2003  . Headache(784.0)   . Hyperlipidemia   . Hypertension   . Hypokalemia   . LBBB (left bundle branch block)   . Lumbar back pain   . LV dysfunction    a. EF 45% by cath 2014. b. EF 50-55% by technically difficult echo in 08/2014.  Marland Kitchen Lymphadenitis   . Morbid obesity ([place])    a. Sleep study negative for  significant OSA in 11/2014.  Marland Kitchen PAF (paroxysmal atrial fibrillation) ([place])   . Stroke [facility]) 2004   affected speech per pt    Past Surgical History:  Procedure Laterality Date  . ABDOMINAL HYSTERECTOMY    . ANGIOPLASTY  laminectomy  . CHOLECYSTECTOMY    . CORONARY ARTERY BYPASS GRAFT     LIMA to LAD   . KNEE ARTHROSCOPY    . LEFT HEART CATHETERIZATION WITH CORONARY/GRAFT ANGIOGRAM  12/07/2012   Procedure: LEFT HEART CATHETERIZATION WITH Beatrix Fetters;  Surgeon: Blane Ohara, MD;  Location: [facility] CATH LAB;  Service: Cardiovascular;;  . LUMBAR LAMINECTOMY     x3  . SPHINCTEROTOMY    . TONSILLECTOMY     Family History:  Family History  Problem Relation Age of Onset  . Heart attack Father   . Heart disease Father   . Stroke Mother   . Kidney disease Unknown   . Stroke Unknown   . Arthritis Unknown   . Hypertension Unknown   . Diabetes Unknown   . Colon cancer Paternal Uncle   . Depression Sister   . Anxiety disorder Maternal Aunt    Family Psychiatric  History:  Social History:  History  Alcohol Use No     History  Drug Use No    Social History   Social History  . Marital status: Legally  Separated    Spouse name: N/A  . Number of children: N/A  . Years of education: N/A   Social History Main Topics  . Smoking status: Former Smoker    Quit date: 03/07/2001  . Smokeless tobacco: Never Used  . Alcohol use No  . Drug use: No  . Sexual activity: Not Currently   Other Topics Concern  . None   Social History Narrative   Lives locally, has help available if needed.   Additional Social History:                         Sleep: Fair  Appetite:  Fair  Current Medications: Current Outpatient Prescriptions  Medication Sig Dispense Refill  . ALPRAZolam (XANAX) 1 MG tablet Take 1 tablet (1 mg total) by mouth 3 (three) times daily as needed for anxiety. 90 tablet 5  . atorvastatin (LIPITOR) 40 MG tablet TAKE 1 TABLET BY MOUTH DAILY 90 tablet 0  .  cyclobenzaprine (FLEXERIL) 10 MG tablet Take 10 mg by mouth every 8 (eight) hours as needed for muscle spasms.    . empagliflozin (JARDIANCE) 25 MG TABS tablet Take 25 mg by mouth daily. 30 tablet 5  . furosemide (LASIX) 20 MG tablet TAKE 1 TABLET BY MOUTH DAILY 90 tablet 0  . gabapentin (NEURONTIN) 300 MG capsule TAKE ONE CAPSULE BY MOUTH THREE TIMES DAILY 90 capsule 3  . glipiZIDE (GLUCOTROL) 5 MG tablet TAKE 2 TABLET BY MOUTH DAILY BEFORE BREAKFAST AND 2 TABLETS BEFORE DINNER 120 tablet 3  . glipiZIDE (GLUCOTROL) 5 MG tablet TAKE 1 TABLET BY MOUTH DAILY BEFORE BREAKFAST AND 2 TABLETS BEFORE DINNER 90 tablet 0  . Liraglutide (VICTOZA) 18 MG/3ML SOPN Inject 1.8 mg into the skin every morning.     . lubiprostone (AMITIZA) 8 MCG capsule Take 1 capsule (8 mcg total) by mouth 2 (two) times daily with a meal. 30 capsule 11  . MAGNESIUM-OXIDE 400 (241.3 Mg) MG tablet TAKE 1 TABLET BY MOUTH TWICE DAILY 60 tablet 11  . metFORMIN (GLUCOPHAGE-XR) 500 MG 24 hr tablet TAKE 2 TABLETS BY MOUTH TWICE DAILY WITH MEALS 120 tablet 0  . metoprolol succinate (TOPROL-XL) 100 MG 24 hr tablet Take 1 tablet (100 mg total) by mouth 2 (two) times daily. 180 tablet 3  . nitroGLYCERIN (NITROSTAT) 0.4 MG SL tablet Place 0.4 mg under the tongue every 5 (five) minutes as needed for chest pain (x 3 doses). Reported on 05/08/2015    . ONE TOUCH ULTRA TEST test strip USE TO TEST BLOOD SUGAR FOUR TIMES DAILY AS DIRECTED 250 each 2  . oxyCODONE-acetaminophen (PERCOCET) 10-325 MG tablet Take 1 tablet by mouth every 4 (four) hours as needed for pain.    . potassium chloride SA (K-DUR,KLOR-CON) 20 MEQ tablet Take 20 mEq by mouth 2 (two) times daily.    Marland Kitchen spironolactone (ALDACTONE) 25 MG tablet TAKE 1 TABLET BY MOUTH TWICE DAILY 180 tablet 0  . telmisartan (MICARDIS) 40 MG tablet TAKE 1 TABLET BY MOUTH EVERY DAY 90 tablet 0  . warfarin (COUMADIN) 6 MG tablet TAKE BY MOUTH AS DIRECTED BY ANTICOAGULATION CLINIC 135 tablet 0  . Suvorexant  (BELSOMRA) 15 MG TABS Take 1 tablet by mouth at bedtime. (Patient not taking: Reported on 08/24/2016) 30 tablet 5   No current facility-administered medications for this visit.     Lab Results: No results found. However, due to the size of the patient record, not all encounters were searched. Please  check Results Review for a complete set of results.  Physical Findings: AIMS:  , ,  ,  ,    CIWA:    COWS:     Musculoskeletal: Strength & Muscle Tone: within normal limits Gait & Station: normal Patient leans: Right  Psychiatric Specialty Exam: ROS  Blood pressure 132/80, pulse 74, height 5\' 1"  (1.549 m), weight 214 lb (97.1 kg).Body mass index is 40.43 kg/m.  General Appearance: NA  Eye Contact::  Good  Speech:  Clear and Coherent  Volume:  Normal  Mood:  NA  Affect:  Congruent  Thought Process:  Coherent  Orientation:  Full (Time, Place, and Person)  Thought Content:  WDL  Suicidal Thoughts:  No  Homicidal Thoughts:  No  Memory:  NA  Judgement:  Good  Insight:  Good  Psychomotor Activity:  Normal  Concentration:  Good  Recall:  Good  Fund of Knowledge:Fair  Language: Good  Akathisia:  No  Handed:  Left  AIMS (if indicated):     Assets:  Desire for Improvement  ADL's:  Intact  Cognition: WNL  Sleep:      Treatment Plan Summary:   At this time the patient is doing well. She is baseline. She is a chronic anxiety disorder is been on Xanax for decades. She takes a very responsibly. She takes 1 mg 3 times a day. She is not sedated at all. She's had no falls at all. She's under cardiologist's care for palpitations. This does not seem at all to be related to Xanax. Overall she functions extremely well. She is positive and optimistic. She denies any neurological symptoms at this time. This patient to return to see me in 5 months.

## 2016-08-26 ENCOUNTER — Other Ambulatory Visit: Payer: Self-pay | Admitting: Internal Medicine

## 2016-08-26 ENCOUNTER — Ambulatory Visit: Payer: Medicare Other

## 2016-08-26 DIAGNOSIS — M961 Postlaminectomy syndrome, not elsewhere classified: Secondary | ICD-10-CM | POA: Diagnosis not present

## 2016-08-26 DIAGNOSIS — M47816 Spondylosis without myelopathy or radiculopathy, lumbar region: Secondary | ICD-10-CM | POA: Diagnosis not present

## 2016-08-26 DIAGNOSIS — M150 Primary generalized (osteo)arthritis: Secondary | ICD-10-CM | POA: Diagnosis not present

## 2016-08-26 DIAGNOSIS — Z79891 Long term (current) use of opiate analgesic: Secondary | ICD-10-CM | POA: Diagnosis not present

## 2016-08-26 DIAGNOSIS — M25561 Pain in right knee: Secondary | ICD-10-CM | POA: Diagnosis not present

## 2016-08-26 DIAGNOSIS — G894 Chronic pain syndrome: Secondary | ICD-10-CM | POA: Diagnosis not present

## 2016-09-02 ENCOUNTER — Ambulatory Visit: Payer: Medicare Other

## 2016-09-04 ENCOUNTER — Other Ambulatory Visit: Payer: Self-pay | Admitting: Internal Medicine

## 2016-09-09 ENCOUNTER — Ambulatory Visit (INDEPENDENT_AMBULATORY_CARE_PROVIDER_SITE_OTHER): Payer: Medicare Other | Admitting: General Practice

## 2016-09-09 DIAGNOSIS — Z5181 Encounter for therapeutic drug level monitoring: Secondary | ICD-10-CM

## 2016-09-09 DIAGNOSIS — Z7901 Long term (current) use of anticoagulants: Secondary | ICD-10-CM

## 2016-09-09 DIAGNOSIS — I480 Paroxysmal atrial fibrillation: Secondary | ICD-10-CM

## 2016-09-09 LAB — POCT INR: INR: 3.4

## 2016-09-09 NOTE — Patient Instructions (Signed)
Pre visit review using our clinic review tool, if applicable. No additional management support is needed unless otherwise documented below in the visit note. 

## 2016-09-12 ENCOUNTER — Other Ambulatory Visit: Payer: Self-pay | Admitting: Internal Medicine

## 2016-09-23 DIAGNOSIS — G894 Chronic pain syndrome: Secondary | ICD-10-CM | POA: Diagnosis not present

## 2016-09-23 DIAGNOSIS — M47816 Spondylosis without myelopathy or radiculopathy, lumbar region: Secondary | ICD-10-CM | POA: Diagnosis not present

## 2016-09-23 DIAGNOSIS — M150 Primary generalized (osteo)arthritis: Secondary | ICD-10-CM | POA: Diagnosis not present

## 2016-09-23 DIAGNOSIS — Z79891 Long term (current) use of opiate analgesic: Secondary | ICD-10-CM | POA: Diagnosis not present

## 2016-09-27 ENCOUNTER — Other Ambulatory Visit: Payer: Self-pay | Admitting: Internal Medicine

## 2016-09-30 ENCOUNTER — Ambulatory Visit (INDEPENDENT_AMBULATORY_CARE_PROVIDER_SITE_OTHER): Payer: Medicare Other | Admitting: General Practice

## 2016-09-30 DIAGNOSIS — Z5181 Encounter for therapeutic drug level monitoring: Secondary | ICD-10-CM

## 2016-09-30 DIAGNOSIS — I480 Paroxysmal atrial fibrillation: Secondary | ICD-10-CM

## 2016-09-30 DIAGNOSIS — Z7901 Long term (current) use of anticoagulants: Secondary | ICD-10-CM | POA: Diagnosis not present

## 2016-09-30 LAB — POCT INR: INR: 3.2

## 2016-09-30 NOTE — Patient Instructions (Signed)
Pre visit review using our clinic review tool, if applicable. No additional management support is needed unless otherwise documented below in the visit note. 

## 2016-09-30 NOTE — Progress Notes (Signed)
I have reviewed and agree with the plan. 

## 2016-10-03 ENCOUNTER — Telehealth: Payer: Self-pay | Admitting: Internal Medicine

## 2016-10-03 ENCOUNTER — Ambulatory Visit: Payer: Self-pay | Admitting: Internal Medicine

## 2016-10-03 NOTE — Telephone Encounter (Signed)
Patient no showed today's appt. Please advise on how to follow up. °A. No follow up necessary. °B. Follow up urgent. Contact patient immediately. °C. Follow up necessary. Contact patient and schedule visit in ___ days. °D. Follow up advised. Contact patient and schedule visit in ____weeks. ° °

## 2016-10-05 ENCOUNTER — Other Ambulatory Visit: Payer: Self-pay | Admitting: Internal Medicine

## 2016-10-07 ENCOUNTER — Encounter: Payer: Self-pay | Admitting: Cardiovascular Disease

## 2016-10-07 ENCOUNTER — Ambulatory Visit (INDEPENDENT_AMBULATORY_CARE_PROVIDER_SITE_OTHER): Payer: Medicare Other | Admitting: Cardiovascular Disease

## 2016-10-07 VITALS — BP 118/70 | HR 87 | Ht 61.0 in | Wt 216.2 lb

## 2016-10-07 DIAGNOSIS — E1169 Type 2 diabetes mellitus with other specified complication: Secondary | ICD-10-CM | POA: Diagnosis not present

## 2016-10-07 DIAGNOSIS — E785 Hyperlipidemia, unspecified: Secondary | ICD-10-CM

## 2016-10-07 DIAGNOSIS — I480 Paroxysmal atrial fibrillation: Secondary | ICD-10-CM

## 2016-10-07 DIAGNOSIS — I25119 Atherosclerotic heart disease of native coronary artery with unspecified angina pectoris: Secondary | ICD-10-CM

## 2016-10-07 NOTE — Progress Notes (Signed)
Cardiology Office Note Date:  10/07/2016   ID:  Mateja, Dier 1945/10/30, MRN [id number]  PCP:  [name], MD  Cardiologist:  Sherren Mocha, MD    Chief Complaint  Patient presents with  . Follow-up     History of Present Illness: Kristen [name] is a 71 y.o. female who presents for follow-up of CAD.   The patient has been followed for many years, last  Seen in September 2017. Medical problems include CABG 2004 (stable cath 2014 demonstrating stable CAD and continued patency of her LIMA graft), paroxysmal atrial fibrillation, history of stroke, morbid obesity, diabetes, hypertension, left bundle branch block, fibromyalgia, panic and anxiety.  The patient's primary complaint is bilateral knee pain. She is primarily limited by this and unable to walk much.  She continues to have episodic 'anxiety attacks' where she feels short of breath and experiences heart racing. These last 1-2 minutes then typically subside on their own. She's had no recent chest pain or pressure. She denies orthopnea, PND, or leg swelling.  Past Medical History:  Diagnosis Date  . Arthritis   . Blood transfusion without reported diagnosis   . Bursitis   . CAD (coronary artery disease)    a. s/p CABG 2004.b. stable cath 2014 demonstrating stable CAD and continued patency of her LIMA graft.  . Chronic anticoagulation    on coumadin  . Depression   . Diabetes mellitus   . Diastolic dysfunction    per echo in October 2012 with EF 50 to 55%  . Fibromyalgia   . GERD (gastroesophageal reflux disease) 10/23/2003  . Headache(784.0)   . Hyperlipidemia   . Hypertension   . Hypokalemia   . LBBB (left bundle branch block)   . Lumbar back pain   . LV dysfunction    a. EF 45% by cath 2014. b. EF 50-55% by technically difficult echo in 08/2014.  Marland Kitchen Lymphadenitis   . Morbid obesity ([place])    a. Sleep study negative for significant OSA in 11/2014.  Marland Kitchen PAF (paroxysmal atrial fibrillation) ([place])     . Stroke [facility]) 2004   affected speech per pt    Past Surgical History:  Procedure Laterality Date  . ABDOMINAL HYSTERECTOMY    . ANGIOPLASTY  laminectomy  . CHOLECYSTECTOMY    . CORONARY ARTERY BYPASS GRAFT     LIMA to LAD   . KNEE ARTHROSCOPY    . LEFT HEART CATHETERIZATION WITH CORONARY/GRAFT ANGIOGRAM  12/07/2012   Procedure: LEFT HEART CATHETERIZATION WITH Beatrix Fetters;  Surgeon: Blane Ohara, MD;  Location: [facility] CATH LAB;  Service: Cardiovascular;;  . LUMBAR LAMINECTOMY     x3  . SPHINCTEROTOMY    . TONSILLECTOMY      Current Outpatient Prescriptions  Medication Sig Dispense Refill  . ALPRAZolam (XANAX) 1 MG tablet Take 1 tablet (1 mg total) by mouth 3 (three) times daily as needed for anxiety. 90 tablet 5  . atorvastatin (LIPITOR) 40 MG tablet TAKE 1 TABLET BY MOUTH DAILY 90 tablet 0  . cyclobenzaprine (FLEXERIL) 10 MG tablet Take 10 mg by mouth every 8 (eight) hours as needed for muscle spasms.    . empagliflozin (JARDIANCE) 25 MG TABS tablet Take 25 mg by mouth daily. 30 tablet 5  . furosemide (LASIX) 20 MG tablet Take 1 tablet (20 mg total) by mouth daily. 90 tablet 0  . gabapentin (NEURONTIN) 300 MG capsule TAKE ONE CAPSULE BY MOUTH THREE TIMES DAILY 90 capsule 3  . glipiZIDE (  GLUCOTROL) 5 MG tablet TAKE 2 TABLET BY MOUTH DAILY BEFORE BREAKFAST AND 2 TABLETS BEFORE DINNER 120 tablet 3  . Liraglutide (VICTOZA) 18 MG/3ML SOPN Inject 1.8 mg into the skin every morning.     . lubiprostone (AMITIZA) 8 MCG capsule Take 1 capsule (8 mcg total) by mouth 2 (two) times daily with a meal. 30 capsule 11  . MAGNESIUM-OXIDE 400 (241.3 Mg) MG tablet TAKE 1 TABLET BY MOUTH TWICE DAILY 60 tablet 11  . metFORMIN (GLUCOPHAGE-XR) 500 MG 24 hr tablet TAKE 2 TABLETS BY MOUTH TWICE DAILY WITH MEALS 120 tablet 0  . metoprolol succinate (TOPROL-XL) 100 MG 24 hr tablet Take 1 tablet (100 mg total) by mouth 2 (two) times daily. 180 tablet 3  . nitroGLYCERIN (NITROSTAT) 0.4 MG SL  tablet Place 0.4 mg under the tongue every 5 (five) minutes as needed for chest pain (x 3 doses). Reported on 05/08/2015    . ONE TOUCH ULTRA TEST test strip USE TO TEST BLOOD SUGAR FOUR TIMES DAILY AS DIRECTED 250 each 2  . oxyCODONE-acetaminophen (PERCOCET) 10-325 MG tablet Take 1 tablet by mouth every 4 (four) hours as needed for pain.    . potassium chloride SA (K-DUR,KLOR-CON) 20 MEQ tablet Take 20 mEq by mouth 2 (two) times daily.    Marland Kitchen spironolactone (ALDACTONE) 25 MG tablet TAKE 1 TABLET BY MOUTH TWICE DAILY 180 tablet 0  . telmisartan (MICARDIS) 40 MG tablet TAKE 1 TABLET BY MOUTH EVERY DAY 90 tablet 0  . warfarin (COUMADIN) 6 MG tablet TAKE BY MOUTH AS DIRECTED BY ANTICOAGULATION CLINIC 135 tablet 0  . Suvorexant (BELSOMRA) 15 MG TABS Take 1 tablet by mouth at bedtime. (Patient not taking: Reported on 08/24/2016) 30 tablet 5   No current facility-administered medications for this visit.     Allergies:   Metformin and related; Cleocin [clindamycin hcl]; Codeine; Doxycycline hyclate; Macrolides and ketolides; Morphine; Pentazocine lactate; Vibramycin [doxycycline calcium]; Definity [perflutren lipid microsphere]; and Sulfonamide derivatives   Social History:  The patient  reports that she quit smoking about 15 years ago. She has never used smokeless tobacco. She reports that she does not drink alcohol or use drugs.   Family History:  The patient's family history includes Anxiety disorder in her maternal aunt; Arthritis in her unknown relative; Colon cancer in her paternal uncle; Depression in her sister; Diabetes in her unknown relative; Heart attack in her father; Heart disease in her father; Hypertension in her unknown relative; Kidney disease in her unknown relative; Stroke in her mother and unknown relative.    ROS:  Please see the history of present illness.  Otherwise, review of systems is positive for blurry vision, exertional dyspnea, excess sweating, knee and leg pain, snoring.  All  other systems are reviewed and negative.    PHYSICAL EXAM: VS:  BP 118/70   Pulse 87   Ht '5\' 1"'$  (1.549 m)   Wt 216 lb 3.2 oz (98.1 kg)   BMI 40.85 kg/m  , BMI Body mass index is 40.85 kg/m. GEN: Well nourished, well developed, obese woman in no acute distress  HEENT: normal  Neck: no JVD, no masses. No carotid bruits Cardiac: RRR without murmur or gallop                Respiratory:  clear to auscultation bilaterally, normal work of breathing GI: soft, nontender, nondistended, + BS MS: no deformity or atrophy  Ext: no pretibial edema, pedal pulses 2+= bilaterally Skin: warm and dry, no rash Neuro:  Strength and sensation are intact Psych: euthymic mood, full affect  EKG:  EKG is ordered today. The ekg ordered today shows NSR 87 bpm, LBBB, no change from previous.  Recent Labs: 02/05/2016: BUN 16; Creatinine, Ser 0.90; Hemoglobin 14.7; Platelets 323; Potassium 4.3; Sodium 138   Lipid Panel     Component Value Date/Time   CHOL 100 (L) 09/01/2015 1344   TRIG 104 09/01/2015 1344   HDL 37 (L) 09/01/2015 1344   CHOLHDL 2.7 09/01/2015 1344   VLDL 21 09/01/2015 1344   LDLCALC 42 09/01/2015 1344   LDLDIRECT 116.0 11/28/2008 1122      Wt Readings from Last 3 Encounters:  10/07/16 216 lb 3.2 oz (98.1 kg)  08/04/16 217 lb (98.4 kg)  06/14/16 220 lb (99.8 kg)    ASSESSMENT AND PLAN: 1.  Hypertension: The patient's blood pressure appears to be much better controlled. She's had a lot of problems with labile blood pressures in the past but seems to be doing better in this regard. Will continue her current medications.  2. Coronary artery disease, native vessel, with angina: The patient will continue on metoprolol succinate. She is not on antiplatelet therapy because of oral anticoagulation with warfarin.  3. Paroxysmal atrial fibrillation: She is maintaining sinus rhythm and tolerating anticoagulation with warfarin.  4. Hyperlipidemia: Treated with atorvastatin. She is due for  lipids and LFTs. Will update her labs. Her last lipids from one year ago are reviewed today. Lifestyle modification is reviewed with the patient.  Current medicines are reviewed with the patient today.  The patient does not have concerns regarding medicines.  Labs/ tests ordered today include:   Orders Placed This Encounter  Procedures  . Lipid panel  . Comp Met (CMET)  . EKG 12-Lead    Disposition:   FU 6 months  Signed, Sherren Mocha, MD  10/07/2016 [facility] [place], [place], [place]  [address] Phone: [phone]; Fax: [phone]

## 2016-10-07 NOTE — Patient Instructions (Signed)
Medication Instructions:  Your physician recommends that you continue on your current medications as directed. Please refer to the Current Medication list given to you today.  Labwork: Your physician recommends that you return for a FASTING LIPID and CMP--nothing to eat or drink after midnight, lab opens at 7:30 AM  Testing/Procedures: No new orders.   Follow-Up: Your physician wants you to follow-up in: 6 MONTHS with Dr Burt Knack.  You will receive a reminder letter in the mail two months in advance. If you don't receive a letter, please call our office to schedule the follow-up appointment.   Any Other Special Instructions Will Be Listed Below (If Applicable).     If you need a refill on your cardiac medications before your next appointment, please call your pharmacy.

## 2016-10-08 ENCOUNTER — Other Ambulatory Visit: Payer: Self-pay | Admitting: Internal Medicine

## 2016-10-10 ENCOUNTER — Other Ambulatory Visit: Payer: Self-pay | Admitting: Internal Medicine

## 2016-10-11 ENCOUNTER — Other Ambulatory Visit: Payer: Medicare Other | Admitting: *Deleted

## 2016-10-11 DIAGNOSIS — E1169 Type 2 diabetes mellitus with other specified complication: Secondary | ICD-10-CM | POA: Diagnosis not present

## 2016-10-11 DIAGNOSIS — E785 Hyperlipidemia, unspecified: Secondary | ICD-10-CM | POA: Diagnosis not present

## 2016-10-11 DIAGNOSIS — I480 Paroxysmal atrial fibrillation: Secondary | ICD-10-CM

## 2016-10-11 LAB — COMPREHENSIVE METABOLIC PANEL
A/G RATIO: 1.6 (ref 1.2–2.2)
ALBUMIN: 4.7 g/dL (ref 3.6–4.8)
ALT: 15 IU/L (ref 0–32)
AST: 16 IU/L (ref 0–40)
Alkaline Phosphatase: 67 IU/L (ref 39–117)
BUN / CREAT RATIO: 18 (ref 12–28)
BUN: 16 mg/dL (ref 8–27)
Bilirubin Total: 0.6 mg/dL (ref 0.0–1.2)
CO2: 18 mmol/L — ABNORMAL LOW (ref 20–29)
Calcium: 9.8 mg/dL (ref 8.7–10.3)
Chloride: 104 mmol/L (ref 96–106)
Creatinine, Ser: 0.89 mg/dL (ref 0.57–1.00)
GFR calc non Af Amer: 67 mL/min/{1.73_m2} (ref >59)
GFR, EST AFRICAN AMERICAN: 77 mL/min/{1.73_m2} (ref >59)
GLOBULIN, TOTAL: 2.9 g/dL (ref 1.5–4.5)
GLUCOSE: 132 mg/dL — AB (ref 65–99)
Potassium: 4.4 mmol/L (ref 3.5–5.2)
SODIUM: 137 mmol/L (ref 134–144)
Total Protein: 7.6 g/dL (ref 6.0–8.5)

## 2016-10-11 LAB — LIPID PANEL
CHOLESTEROL TOTAL: 102 mg/dL (ref 100–199)
Chol/HDL Ratio: 2.9 ratio (ref 0.0–4.4)
HDL: 35 mg/dL — AB (ref >39)
LDL Calculated: 42 mg/dL (ref 0–99)
TRIGLYCERIDES: 126 mg/dL (ref 0–149)
VLDL CHOLESTEROL CAL: 25 mg/dL (ref 5–40)

## 2016-10-11 NOTE — Progress Notes (Signed)
Pre visit review using our clinic review tool, if applicable. No additional management support is needed unless otherwise documented below in the visit note. 

## 2016-10-11 NOTE — Progress Notes (Signed)
Subjective:   Kristen [name] is a 71 y.o. female who presents for Medicare Annual (Subsequent) preventive examination.  Review of Systems:  No ROS.  Medicare Wellness Visit. Additional risk factors are reflected in the social history.  Cardiac Risk Factors include: advanced age (>57men, >75 women);diabetes mellitus;obesity (BMI >30kg/m2);sedentary lifestyle Sleep patterns: has interrupted sleep, has daytime sleepiness, gets up 1 times nightly to void and sleeps 4 hours nightly. Patient reports insomnia issues, discussed recommended sleep tips and stress reduction tips, education was attached to patient's AVS.  Home Safety/Smoke Alarms: Feels safe in home. Smoke alarms in place.  Living environment; residence and Adult nurse: apartment, equipment: Hydrologist, Type: Tub Surveyor, quantity, no firearms. Lives alone, no needs for DME, good support system  Seat Belt Safety/Bike Helmet: Wears seat belt.   Counseling:   Eye Exam- appointment yearly, up coming appointment Dr. Valetta Close  Dental- Will make an appointment with Dr. Rex Kras  Female:   Pap- N/A     Mammo- Last 06/07/16,  BI-RADS category 1: negative     Dexa scan- Last 04/17/06, normal, referral placed today       CCS- Last 03/20/12, recall 10 years     Objective:     Vitals: BP 127/72   Pulse 84   Temp 98.5 F (36.9 C)   Resp 20   Ht 5\' 2"  (1.575 m)   Wt 215 lb (97.5 kg)   SpO2 98%   BMI 39.32 kg/m   Body mass index is 39.32 kg/m.   Tobacco History  Smoking Status  . Former Smoker  . Quit date: 03/07/2001  Smokeless Tobacco  . Never Used     Counseling given: Not Answered   Past Medical History:  Diagnosis Date  . Arthritis   . Blood transfusion without reported diagnosis   . Bursitis   . CAD (coronary artery disease)    a. s/p CABG 2004.b. stable cath 2014 demonstrating stable CAD and continued patency of her LIMA graft.  . Chronic anticoagulation    on coumadin  . Depression   . Diabetes mellitus   .  Diastolic dysfunction    per echo in October 2012 with EF 50 to 55%  . Fibromyalgia   . GERD (gastroesophageal reflux disease) 10/23/2003  . Headache(784.0)   . Hyperlipidemia   . Hypertension   . Hypokalemia   . LBBB (left bundle branch block)   . Lumbar back pain   . LV dysfunction    a. EF 45% by cath 2014. b. EF 50-55% by technically difficult echo in 08/2014.  Marland Kitchen Lymphadenitis   . Morbid obesity ([place])    a. Sleep study negative for significant OSA in 11/2014.  Marland Kitchen PAF (paroxysmal atrial fibrillation) ([place])   . Stroke [facility]) 2004   affected speech per pt   Past Surgical History:  Procedure Laterality Date  . ABDOMINAL HYSTERECTOMY    . ANGIOPLASTY  laminectomy  . CHOLECYSTECTOMY    . CORONARY ARTERY BYPASS GRAFT     LIMA to LAD   . KNEE ARTHROSCOPY    . LEFT HEART CATHETERIZATION WITH CORONARY/GRAFT ANGIOGRAM  12/07/2012   Procedure: LEFT HEART CATHETERIZATION WITH Beatrix Fetters;  Surgeon: Blane Ohara, MD;  Location: [facility] CATH LAB;  Service: Cardiovascular;;  . LUMBAR LAMINECTOMY     x3  . SPHINCTEROTOMY    . TONSILLECTOMY     Family History  Problem Relation Age of Onset  . Heart attack Father   . Heart disease Father   .  Stroke Mother   . Kidney disease Unknown   . Stroke Unknown   . Arthritis Unknown   . Hypertension Unknown   . Diabetes Unknown   . Colon cancer Paternal Uncle   . Depression Sister   . Anxiety disorder Maternal Aunt    History  Sexual Activity  . Sexual activity: Not Currently    Outpatient Encounter Prescriptions as of 10/12/2016  Medication Sig  . ALPRAZolam (XANAX) 1 MG tablet Take 1 tablet (1 mg total) by mouth 3 (three) times daily as needed for anxiety.  Marland Kitchen atorvastatin (LIPITOR) 40 MG tablet TAKE 1 TABLET BY MOUTH DAILY  . cyclobenzaprine (FLEXERIL) 10 MG tablet Take 10 mg by mouth every 8 (eight) hours as needed for muscle spasms.  . empagliflozin (JARDIANCE) 25 MG TABS tablet Take 25 mg by mouth daily.  . furosemide  (LASIX) 20 MG tablet Take 1 tablet (20 mg total) by mouth daily.  Marland Kitchen gabapentin (NEURONTIN) 300 MG capsule TAKE ONE CAPSULE BY MOUTH THREE TIMES DAILY  . glipiZIDE (GLUCOTROL) 5 MG tablet TAKE 2 TABLET BY MOUTH DAILY BEFORE BREAKFAST AND 2 TABLETS BEFORE DINNER  . Liraglutide (VICTOZA) 18 MG/3ML SOPN Inject 1.8 mg into the skin every morning.   . lubiprostone (AMITIZA) 8 MCG capsule Take 1 capsule (8 mcg total) by mouth 2 (two) times daily with a meal.  . MAGNESIUM-OXIDE 400 (241.3 Mg) MG tablet TAKE 1 TABLET BY MOUTH TWICE DAILY  . metFORMIN (GLUCOPHAGE-XR) 500 MG 24 hr tablet TAKE 2 TABLETS BY MOUTH TWICE DAILY WITH MEALS  . metoprolol succinate (TOPROL-XL) 100 MG 24 hr tablet Take 1 tablet (100 mg total) by mouth 2 (two) times daily.  . nitroGLYCERIN (NITROSTAT) 0.4 MG SL tablet Place 0.4 mg under the tongue every 5 (five) minutes as needed for chest pain (x 3 doses). Reported on 05/08/2015  . ONE TOUCH ULTRA TEST test strip USE TO TEST BLOOD SUGAR FOUR TIMES DAILY AS DIRECTED  . oxyCODONE-acetaminophen (PERCOCET) 10-325 MG tablet Take 1 tablet by mouth every 4 (four) hours as needed for pain.  . potassium chloride SA (K-DUR,KLOR-CON) 20 MEQ tablet Take 20 mEq by mouth 2 (two) times daily.  Marland Kitchen spironolactone (ALDACTONE) 25 MG tablet TAKE 1 TABLET BY MOUTH TWICE DAILY  . Suvorexant (BELSOMRA) 15 MG TABS Take 1 tablet by mouth at bedtime.  Marland Kitchen telmisartan (MICARDIS) 40 MG tablet TAKE 1 TABLET BY MOUTH EVERY DAY  . warfarin (COUMADIN) 6 MG tablet TAKE BY MOUTH AS DIRECTED BY ANTICOAGULATION CLINIC   No facility-administered encounter medications on file as of 10/12/2016.     Activities of Daily Living In your present state of health, do you have any difficulty performing the following activities: 10/12/2016  Hearing? N  Vision? N  Difficulty concentrating or making decisions? N  Walking or climbing stairs? Y  Dressing or bathing? N  Doing errands, shopping? Y  Preparing Food and eating ? Y  Using  the Toilet? N  In the past six months, have you accidently leaked urine? N  Do you have problems with loss of bowel control? N  Managing your Medications? N  Managing your Finances? N  Housekeeping or managing your Housekeeping? Y  Some recent data might be hidden    Patient Care Team: [name], MD as PCP - General (Internal Medicine) Deboraha Sprang, MD as Consulting Physician (Cardiology) Sherren Mocha, MD as Consulting Physician (Cardiology)    Assessment:    Physical assessment deferred to PCP.  Exercise Activities  and Dietary recommendations Current Exercise Habits: Home exercise routine (chair exercise pamphlet provided), Type of exercise: stretching;walking, Time (Minutes): 40, Frequency (Times/Week): 5, Weekly Exercise (Minutes/Week): 200, Intensity: Mild, Exercise limited by: orthopedic condition(s)  Diet (meal preparation, eat out, water intake, caffeinated beverages, dairy products, fruits and vegetables): in general, a "healthy" diet  , well balanced, eats a variety of fruits and vegetables daily, limits salt, fat/cholesterol, sugar, caffeine, drinks 6-8 glasses of water daily.  Reviewed heart healthy and diabetic diet, encouraged patient to increase daily water intake. Diet education was provided via handout.   Goals    . lose weight          I will watch carbohydrates and sugars and increase my activity level by doing stretching and chair exercises.      Fall Risk Fall Risk  10/12/2016 09/01/2015 08/09/2013 03/18/2013 12/12/2011  Falls in the past year? No No No No -  Risk for fall due to : Impaired mobility;Impaired balance/gait - - - History of fall(s);Impaired balance/gait   Depression Screen PHQ 2/9 Scores 10/12/2016 09/01/2015 08/09/2013 03/18/2013  PHQ - 2 Score 1 0 1 3  PHQ- 9 Score 5 - - 10     Cognitive Function       Ad8 score reviewed for issues:  Issues making decisions: no  Less interest in hobbies / activities: no  Repeats questions,  stories (family complaining): no  Trouble using ordinary gadgets (microwave, computer, phone):no  Forgets the month or year: no  Mismanaging finances: no  Remembering appts: no  Daily problems with thinking and/or memory: no Ad8 score is= 0  Immunization History  Administered Date(s) Administered  . Influenza Split 12/14/2010, 12/12/2011  . Influenza Whole 01/05/2006, 01/29/2007, 12/26/2007, 11/28/2008, 12/04/2009  . Influenza, High Dose Seasonal PF 11/12/2014, 01/26/2016  . Influenza,inj,Quad PF,36+ Mos 11/12/2012, 04/08/2014  . Pneumococcal Conjugate-13 08/09/2013  . Pneumococcal Polysaccharide-23 12/04/2009, 12/14/2010, 10/12/2016  . Td 03/07/2005  . Tdap 04/14/2016   Screening Tests Health Maintenance  Topic Date Due  . OPHTHALMOLOGY EXAM  04/29/2015  . INFLUENZA VACCINE  10/05/2016  . HEMOGLOBIN A1C  12/14/2016  . FOOT EXAM  04/14/2017  . MAMMOGRAM  06/08/2018  . COLONOSCOPY  03/20/2022  . TETANUS/TDAP  04/14/2026  . DEXA SCAN  Completed  . Hepatitis C Screening  Completed  . PNA vac Low Risk Adult  Completed      Plan:    Continue doing brain stimulating activities (puzzles, reading, adult coloring books, staying active) to keep memory sharp.   Continue to eat heart healthy diet (full of fruits, vegetables, whole grains, lean protein, water--limit salt, fat, and sugar intake) and increase physical activity as tolerated.   I have personally reviewed and noted the following in the patient's chart:   . Medical and social history . Use of alcohol, tobacco or illicit drugs  . Current medications and supplements . Functional ability and status . Nutritional status . Physical activity . Advanced directives . List of other physicians . Vitals . Screenings to include cognitive, depression, and falls . Referrals and appointments  In addition, I have reviewed and discussed with patient certain preventive protocols, quality metrics, and best practice  recommendations. A written personalized care plan for preventive services as well as general preventive health recommendations were provided to patient.     Michiel Cowboy, RN  10/13/2016

## 2016-10-12 ENCOUNTER — Encounter: Payer: Self-pay | Admitting: Internal Medicine

## 2016-10-12 ENCOUNTER — Ambulatory Visit (INDEPENDENT_AMBULATORY_CARE_PROVIDER_SITE_OTHER): Payer: Medicare Other | Admitting: Internal Medicine

## 2016-10-12 VITALS — BP 128/72 | HR 84 | Temp 98.0°F | Resp 16 | Ht 61.0 in | Wt 216.0 lb

## 2016-10-12 DIAGNOSIS — Z0000 Encounter for general adult medical examination without abnormal findings: Secondary | ICD-10-CM | POA: Diagnosis not present

## 2016-10-12 DIAGNOSIS — G4701 Insomnia due to medical condition: Secondary | ICD-10-CM | POA: Diagnosis not present

## 2016-10-12 DIAGNOSIS — I10 Essential (primary) hypertension: Secondary | ICD-10-CM

## 2016-10-12 DIAGNOSIS — G8929 Other chronic pain: Secondary | ICD-10-CM | POA: Diagnosis not present

## 2016-10-12 DIAGNOSIS — Z23 Encounter for immunization: Secondary | ICD-10-CM | POA: Diagnosis not present

## 2016-10-12 DIAGNOSIS — E1159 Type 2 diabetes mellitus with other circulatory complications: Secondary | ICD-10-CM

## 2016-10-12 DIAGNOSIS — E2839 Other primary ovarian failure: Secondary | ICD-10-CM

## 2016-10-12 DIAGNOSIS — E1151 Type 2 diabetes mellitus with diabetic peripheral angiopathy without gangrene: Secondary | ICD-10-CM | POA: Diagnosis not present

## 2016-10-12 DIAGNOSIS — E039 Hypothyroidism, unspecified: Secondary | ICD-10-CM

## 2016-10-12 DIAGNOSIS — I152 Hypertension secondary to endocrine disorders: Secondary | ICD-10-CM

## 2016-10-12 NOTE — Progress Notes (Signed)
Subjective:  Patient ID: Kristen [name], female    DOB: Jun 17, 1945  Age: 71 y.o. MRN: [id number]  CC: Medicare Wellness; Hypertension; Hypothyroidism; and Diabetes   HPI Kristen [name] presents for a CPX.  She was recently seen by cardiology and had lab work done. She had a low bicarbonate at 20 and she is concerned about that. She wants to have it rechecked. She feels well today and offers no complaints. She's had no recent episodes of abdominal pain, nausea, vomiting, fatigue, chest pain, or shortness of breath. She complains of persistent insomnia and says Belsomra was not helpful.  Outpatient Medications Prior to Visit  Medication Sig Dispense Refill  . ALPRAZolam (XANAX) 1 MG tablet Take 1 tablet (1 mg total) by mouth 3 (three) times daily as needed for anxiety. 90 tablet 5  . atorvastatin (LIPITOR) 40 MG tablet TAKE 1 TABLET BY MOUTH DAILY 90 tablet 0  . cyclobenzaprine (FLEXERIL) 10 MG tablet Take 10 mg by mouth every 8 (eight) hours as needed for muscle spasms.    . empagliflozin (JARDIANCE) 25 MG TABS tablet Take 25 mg by mouth daily. 30 tablet 5  . furosemide (LASIX) 20 MG tablet Take 1 tablet (20 mg total) by mouth daily. 90 tablet 0  . gabapentin (NEURONTIN) 300 MG capsule TAKE ONE CAPSULE BY MOUTH THREE TIMES DAILY 90 capsule 0  . glipiZIDE (GLUCOTROL) 5 MG tablet TAKE 2 TABLET BY MOUTH DAILY BEFORE BREAKFAST AND 2 TABLETS BEFORE DINNER 120 tablet 3  . Liraglutide (VICTOZA) 18 MG/3ML SOPN Inject 1.8 mg into the skin every morning.     . lubiprostone (AMITIZA) 8 MCG capsule Take 1 capsule (8 mcg total) by mouth 2 (two) times daily with a meal. 30 capsule 11  . MAGNESIUM-OXIDE 400 (241.3 Mg) MG tablet TAKE 1 TABLET BY MOUTH TWICE DAILY 60 tablet 11  . metFORMIN (GLUCOPHAGE-XR) 500 MG 24 hr tablet TAKE 2 TABLETS BY MOUTH TWICE DAILY WITH MEALS 120 tablet 0  . metoprolol succinate (TOPROL-XL) 100 MG 24 hr tablet Take 1 tablet (100 mg total) by mouth 2 (two) times  daily. 180 tablet 3  . nitroGLYCERIN (NITROSTAT) 0.4 MG SL tablet Place 0.4 mg under the tongue every 5 (five) minutes as needed for chest pain (x 3 doses). Reported on 05/08/2015    . ONE TOUCH ULTRA TEST test strip USE TO TEST BLOOD SUGAR FOUR TIMES DAILY AS DIRECTED 250 each 2  . oxyCODONE-acetaminophen (PERCOCET) 10-325 MG tablet Take 1 tablet by mouth every 4 (four) hours as needed for pain.    . potassium chloride SA (K-DUR,KLOR-CON) 20 MEQ tablet Take 20 mEq by mouth 2 (two) times daily.    Marland Kitchen spironolactone (ALDACTONE) 25 MG tablet TAKE 1 TABLET BY MOUTH TWICE DAILY 180 tablet 0  . Suvorexant (BELSOMRA) 15 MG TABS Take 1 tablet by mouth at bedtime. 30 tablet 5  . telmisartan (MICARDIS) 40 MG tablet TAKE 1 TABLET BY MOUTH EVERY DAY 90 tablet 0  . warfarin (COUMADIN) 6 MG tablet TAKE BY MOUTH AS DIRECTED BY ANTICOAGULATION CLINIC 135 tablet 0   No facility-administered medications prior to visit.     ROS Review of Systems  Constitutional: Negative.  Negative for appetite change, diaphoresis, fatigue and unexpected weight change.  HENT: Negative.   Eyes: Negative.   Respiratory: Negative.  Negative for apnea, cough, chest tightness, shortness of breath and wheezing.   Cardiovascular: Negative for chest pain, palpitations and leg swelling.  Gastrointestinal: Negative for abdominal pain, constipation,  diarrhea, nausea and vomiting.  Endocrine: Negative.   Genitourinary: Negative.  Negative for decreased urine volume, difficulty urinating, hematuria and urgency.  Musculoskeletal: Positive for arthralgias.  Skin: Negative.  Negative for color change and rash.  Allergic/Immunologic: Negative.   Neurological: Positive for dizziness. Negative for weakness.  Hematological: Negative for adenopathy. Does not bruise/bleed easily.  Psychiatric/Behavioral: Positive for sleep disturbance. Negative for confusion, decreased concentration, dysphoric mood and hallucinations. The patient is  nervous/anxious.     Objective:  BP 128/72   Pulse 84   Temp 98 F (36.7 C) (Oral)   Resp 16   Ht 5\' 1"  (1.549 m)   Wt 216 lb (98 kg)   SpO2 98%   BMI 40.81 kg/m   BP Readings from Last 3 Encounters:  10/12/16 128/72  10/07/16 118/70  08/04/16 108/80    Wt Readings from Last 3 Encounters:  10/12/16 216 lb (98 kg)  10/07/16 216 lb 3.2 oz (98.1 kg)  08/04/16 217 lb (98.4 kg)    Physical Exam  Constitutional: She is oriented to person, place, and time. No distress.  HENT:  Mouth/Throat: Oropharynx is clear and moist. No oropharyngeal exudate.  Eyes: Conjunctivae are normal. Right eye exhibits no discharge. Left eye exhibits no discharge. No scleral icterus.  Neck: Normal range of motion. Neck supple. No JVD present. No thyromegaly present.  Cardiovascular: Normal rate, regular rhythm and intact distal pulses.  Exam reveals no gallop and no friction rub.   No murmur heard. Pulmonary/Chest: Effort normal and breath sounds normal. No respiratory distress. She has no wheezes. She has no rales. She exhibits no tenderness.  Abdominal: Soft. Bowel sounds are normal. She exhibits no distension and no mass. There is no tenderness. There is no rebound and no guarding.  Genitourinary:  Genitourinary Comments: Breast, GU, rectal exams were deferred at her request.  Musculoskeletal: Normal range of motion. She exhibits no edema, tenderness or deformity.  Lymphadenopathy:    She has no cervical adenopathy.  Neurological: She is alert and oriented to person, place, and time.  Skin: Skin is warm and dry. No rash noted. She is not diaphoretic. No erythema. No pallor.  Psychiatric: She has a normal mood and affect. Her behavior is normal. Judgment and thought content normal.  Vitals reviewed.   Lab Results  Component Value Date   WBC 6.6 02/05/2016   HGB 14.7 02/05/2016   HCT 42.9 02/05/2016   PLT 323 02/05/2016   GLUCOSE 145 (H) 10/13/2016   CHOL 102 10/11/2016   TRIG 126  10/11/2016   HDL 35 (L) 10/11/2016   LDLDIRECT 116.0 11/28/2008   LDLCALC 42 10/11/2016   ALT 15 10/11/2016   AST 16 10/11/2016   NA 139 10/13/2016   K 4.2 10/13/2016   CL 107 10/13/2016   CREATININE 0.88 10/13/2016   BUN 15 10/13/2016   CO2 24 10/13/2016   TSH 4.41 10/13/2016   INR 3.2 09/30/2016   HGBA1C 7.3 10/13/2016   MICROALBUR 0.3 10/13/2016    Mm Digital Screening Bilateral  Result Date: 06/07/2016 CLINICAL DATA:  Screening. EXAM: DIGITAL SCREENING BILATERAL MAMMOGRAM WITH CAD COMPARISON:  Previous exam(s). ACR Breast Density Category b: There are scattered areas of fibroglandular density. FINDINGS: There are no findings suspicious for malignancy. Images were processed with CAD. IMPRESSION: No mammographic evidence of malignancy. A result letter of this screening mammogram will be mailed directly to the patient. RECOMMENDATION: Screening mammogram in one year. (Code:SM-B-01Y) BI-RADS CATEGORY  1: Negative. Electronically Signed   By:  Ammie Ferrier M.D.   On: 06/07/2016 15:02    Assessment & Plan:   Seerat was seen today for medicare wellness, hypertension, hypothyroidism and diabetes.  Diagnoses and all orders for this visit:  DM (diabetes mellitus), type 2 with peripheral vascular complications ([place])- her A1c is a little high at 7.3% but she does not want to add another medication. She will work on her lifestyle modifications. Her bicarbonate is normal now and there is no evidence of acidosis. -     Basic metabolic panel; Future -     Microalbumin / creatinine urine ratio; Future -     POCT glycosylated hemoglobin (Hb A1C)  Acquired hypothyroidism- her TSH is in the normal range, thyroid replacement therapy is not indicated. -     TSH; Future  Hypertension associated with diabetes ([place])- her blood pressure is well-controlled, electrolytes and renal function are normal. -     Basic metabolic panel; Future -     Urinalysis, Routine w reflex microscopic;  Future  Need for pneumococcal vaccination -     Pneumococcal polysaccharide vaccine 23-valent greater than or equal to 2yo subcutaneous/IM  Estrogen deficiency -     DEXAScan; Future  Encounter for Medicare annual wellness exam  Insomnia secondary to chronic pain -     Discontinue: Eszopiclone 3 MG TABS; Take 1 tablet (3 mg total) by mouth at bedtime. Take immediately before bedtime -     Eszopiclone 3 MG TABS; Take 1 tablet (3 mg total) by mouth at bedtime. Take immediately before bedtime  Routine general medical examination at a health care facility   I am having [name] Dick maintain her liraglutide, nitroGLYCERIN, lubiprostone, oxyCODONE-acetaminophen, cyclobenzaprine, MAGNESIUM-OXIDE, potassium chloride SA, glipiZIDE, metoprolol succinate, telmisartan, empagliflozin, spironolactone, ONE TOUCH ULTRA TEST, Suvorexant, ALPRAZolam, furosemide, warfarin, metFORMIN, gabapentin, atorvastatin, and Eszopiclone.  Meds ordered this encounter  Medications  . DISCONTD: Eszopiclone 3 MG TABS    Sig: Take 1 tablet (3 mg total) by mouth at bedtime. Take immediately before bedtime    Dispense:  30 tablet    Refill:  5  . Eszopiclone 3 MG TABS    Sig: Take 1 tablet (3 mg total) by mouth at bedtime. Take immediately before bedtime    Dispense:  30 tablet    Refill:  5   Medical screening examination/treatment/procedure(s) were performed by non-physician practitioner and as supervising physician I was immediately available for consultation/collaboration. I agree with above. Scarlette Calico, MD   Follow-up: No Follow-up on file.  Scarlette Calico, MD

## 2016-10-12 NOTE — Patient Instructions (Addendum)
Continue doing brain stimulating activities (puzzles, reading, adult coloring books, staying active) to keep memory sharp.   Continue to eat heart healthy diet (full of fruits, vegetables, whole grains, lean protein, water--limit salt, fat, and sugar intake) and increase physical activity as tolerated.   [name] , Thank you for taking time to come for your Medicare Wellness Visit. I appreciate your ongoing commitment to your health goals. Please review the following plan we discussed and let me know if I can assist you in the future.   These are the goals we discussed: Goals    . lose weight          I will watch carbohydrates and sugars and increase my activity level by doing stretching and chair exercises.       This is a list of the screening recommended for you and due dates:  Health Maintenance  Topic Date Due  . Eye exam for diabetics  04/29/2015  . Pneumonia vaccines (2 of 2 - PPSV23) 12/14/2015  . Flu Shot  10/05/2016  . Hemoglobin A1C  12/14/2016  . Complete foot exam   04/14/2017  . Mammogram  06/08/2018  . Colon Cancer Screening  03/20/2022  . Tetanus Vaccine  04/14/2026  . DEXA scan (bone density measurement)  Completed  .  Hepatitis C: One time screening is recommended by Center for Disease Control  (CDC) for  adults born from [birth year] through 1963.   Completed     [name] , Thank you for taking time to come for your Medicare Wellness Visit. I appreciate your ongoing commitment to your health goals. Please review the following plan we discussed and let me know if I can assist you in the future.   These are the goals we discussed: Goals    . lose weight          I will watch carbohydrates and sugars and increase my activity level by doing stretching and chair exercises.       This is a list of the screening recommended for you and due dates:  Health Maintenance  Topic Date Due  . Eye exam for diabetics  04/29/2015  . Pneumonia vaccines (2 of 2 -  PPSV23) 12/14/2015  . Flu Shot  10/05/2016  . Hemoglobin A1C  12/14/2016  . Complete foot exam   04/14/2017  . Mammogram  06/08/2018  . Colon Cancer Screening  03/20/2022  . Tetanus Vaccine  04/14/2026  . DEXA scan (bone density measurement)  Completed  .  Hepatitis C: One time screening is recommended by Center for Disease Control  (CDC) for  adults born from [birth year] through 1963.   Completed     Insomnia Insomnia is a sleep disorder that makes it difficult to fall asleep or to stay asleep. Insomnia can cause tiredness (fatigue), low energy, difficulty concentrating, mood swings, and poor performance at work or school. There are three different ways to classify insomnia:  Difficulty falling asleep.  Difficulty staying asleep.  Waking up too early in the morning.  Any type of insomnia can be long-term (chronic) or short-term (acute). Both are common. Short-term insomnia usually lasts for three months or less. Chronic insomnia occurs at least three times a week for longer than three months. What are the causes? Insomnia may be caused by another condition, situation, or substance, such as:  Anxiety.  Certain medicines.  Gastroesophageal reflux disease (GERD) or other gastrointestinal conditions.  Asthma or other breathing conditions.  Restless legs syndrome,  sleep apnea, or other sleep disorders.  Chronic pain.  Menopause. This may include hot flashes.  Stroke.  Abuse of alcohol, tobacco, or illegal drugs.  Depression.  Caffeine.  Neurological disorders, such as Alzheimer disease.  An overactive thyroid (hyperthyroidism).  The cause of insomnia may not be known. What increases the risk? Risk factors for insomnia include:  Gender. Women are more commonly affected than men.  Age. Insomnia is more common as you get older.  Stress. This may involve your professional or personal life.  Income. Insomnia is more common in people with lower income.  Lack of  exercise.  Irregular work schedule or night shifts.  Traveling between different time zones.  What are the signs or symptoms? If you have insomnia, trouble falling asleep or trouble staying asleep is the main symptom. This may lead to other symptoms, such as:  Feeling fatigued.  Feeling nervous about going to sleep.  Not feeling rested in the morning.  Having trouble concentrating.  Feeling irritable, anxious, or depressed.  How is this treated? Treatment for insomnia depends on the cause. If your insomnia is caused by an underlying condition, treatment will focus on addressing the condition. Treatment may also include:  Medicines to help you sleep.  Counseling or therapy.  Lifestyle adjustments.  Follow these instructions at home:  Take medicines only as directed by your health care provider.  Keep regular sleeping and waking hours. Avoid naps.  Keep a sleep diary to help you and your health care provider figure out what could be causing your insomnia. Include: ? When you sleep. ? When you wake up during the night. ? How well you sleep. ? How rested you feel the next day. ? Any side effects of medicines you are taking. ? What you eat and drink.  Make your bedroom a comfortable place where it is easy to fall asleep: ? Put up shades or special blackout curtains to block light from outside. ? Use a white noise machine to block noise. ? Keep the temperature cool.  Exercise regularly as directed by your health care provider. Avoid exercising right before bedtime.  Use relaxation techniques to manage stress. Ask your health care provider to suggest some techniques that may work well for you. These may include: ? Breathing exercises. ? Routines to release muscle tension. ? Visualizing peaceful scenes.  Cut back on alcohol, caffeinated beverages, and cigarettes, especially close to bedtime. These can disrupt your sleep.  Do not overeat or eat spicy foods right before  bedtime. This can lead to digestive discomfort that can make it hard for you to sleep.  Limit screen use before bedtime. This includes: ? Watching TV. ? Using your smartphone, tablet, and computer.  Stick to a routine. This can help you fall asleep faster. Try to do a quiet activity, brush your teeth, and go to bed at the same time each night.  Get out of bed if you are still awake after 15 minutes of trying to sleep. Keep the lights down, but try reading or doing a quiet activity. When you feel sleepy, go back to bed.  Make sure that you drive carefully. Avoid driving if you feel very sleepy.  Keep all follow-up appointments as directed by your health care provider. This is important. Contact a health care provider if:  You are tired throughout the day or have trouble in your daily routine due to sleepiness.  You continue to have sleep problems or your sleep problems get worse. Get help  right away if:  You have serious thoughts about hurting yourself or someone else. This information is not intended to replace advice given to you by your health care provider. Make sure you discuss any questions you have with your health care provider. Document Released: 02/19/2000 Document Revised: 07/24/2015 Document Reviewed: 11/22/2013 Elsevier Interactive Patient Education  [employer].

## 2016-10-13 ENCOUNTER — Encounter: Payer: Self-pay | Admitting: Internal Medicine

## 2016-10-13 ENCOUNTER — Other Ambulatory Visit (INDEPENDENT_AMBULATORY_CARE_PROVIDER_SITE_OTHER): Payer: Medicare Other

## 2016-10-13 DIAGNOSIS — E039 Hypothyroidism, unspecified: Secondary | ICD-10-CM | POA: Diagnosis not present

## 2016-10-13 DIAGNOSIS — I10 Essential (primary) hypertension: Secondary | ICD-10-CM

## 2016-10-13 DIAGNOSIS — E1151 Type 2 diabetes mellitus with diabetic peripheral angiopathy without gangrene: Secondary | ICD-10-CM

## 2016-10-13 DIAGNOSIS — E1159 Type 2 diabetes mellitus with other circulatory complications: Secondary | ICD-10-CM

## 2016-10-13 LAB — MICROALBUMIN / CREATININE URINE RATIO
CREATININE, U: 72.9 mg/dL
MICROALB UR: 0.3 mg/dL (ref 0.0–1.9)
MICROALB/CREAT RATIO: 0.4 mg/g (ref 0.0–30.0)

## 2016-10-13 LAB — URINALYSIS, ROUTINE W REFLEX MICROSCOPIC
Bilirubin Urine: NEGATIVE
Hgb urine dipstick: NEGATIVE
KETONES UR: NEGATIVE
Leukocytes, UA: NEGATIVE
Nitrite: NEGATIVE
PH: 5.5 (ref 5.0–8.0)
RBC / HPF: NONE SEEN (ref 0–?)
SPECIFIC GRAVITY, URINE: 1.015 (ref 1.000–1.030)
Total Protein, Urine: NEGATIVE
Urine Glucose: >=1000 — AB
Urobilinogen, UA: 0.2 (ref 0.0–1.0)
WBC UA: NONE SEEN (ref 0–?)

## 2016-10-13 LAB — BASIC METABOLIC PANEL
BUN: 15 mg/dL (ref 6–23)
CALCIUM: 9.5 mg/dL (ref 8.4–10.5)
CO2: 24 meq/L (ref 19–32)
CREATININE: 0.88 mg/dL (ref 0.40–1.20)
Chloride: 107 mEq/L (ref 96–112)
GFR: 81.99 mL/min (ref >60.00)
GLUCOSE: 145 mg/dL — AB (ref 70–99)
Potassium: 4.2 mEq/L (ref 3.5–5.1)
Sodium: 139 mEq/L (ref 135–145)

## 2016-10-13 LAB — POCT GLYCOSYLATED HEMOGLOBIN (HGB A1C): Hemoglobin A1C: 7.3

## 2016-10-13 LAB — TSH: TSH: 4.41 u[IU]/mL (ref 0.35–4.50)

## 2016-10-13 MED ORDER — ESZOPICLONE 3 MG PO TABS: 3.0000 mg | ORAL_TABLET | Freq: Every day | ORAL | 5 refills | Status: DC

## 2016-10-14 ENCOUNTER — Encounter: Payer: Self-pay | Admitting: Internal Medicine

## 2016-10-14 NOTE — Assessment & Plan Note (Signed)

## 2016-10-19 ENCOUNTER — Encounter: Payer: Self-pay | Admitting: Internal Medicine

## 2016-10-21 ENCOUNTER — Other Ambulatory Visit: Payer: Self-pay | Admitting: Internal Medicine

## 2016-10-21 DIAGNOSIS — Z79891 Long term (current) use of opiate analgesic: Secondary | ICD-10-CM | POA: Diagnosis not present

## 2016-10-21 DIAGNOSIS — M47816 Spondylosis without myelopathy or radiculopathy, lumbar region: Secondary | ICD-10-CM | POA: Diagnosis not present

## 2016-10-21 DIAGNOSIS — M150 Primary generalized (osteo)arthritis: Secondary | ICD-10-CM | POA: Diagnosis not present

## 2016-10-21 DIAGNOSIS — G894 Chronic pain syndrome: Secondary | ICD-10-CM | POA: Diagnosis not present

## 2016-10-24 ENCOUNTER — Encounter: Payer: Self-pay | Admitting: Internal Medicine

## 2016-10-25 DIAGNOSIS — H524 Presbyopia: Secondary | ICD-10-CM | POA: Diagnosis not present

## 2016-10-25 DIAGNOSIS — H2513 Age-related nuclear cataract, bilateral: Secondary | ICD-10-CM | POA: Diagnosis not present

## 2016-10-25 LAB — HM DIABETES EYE EXAM

## 2016-10-25 NOTE — Telephone Encounter (Signed)
Key: [id number]

## 2016-10-26 ENCOUNTER — Encounter: Payer: Self-pay | Admitting: Internal Medicine

## 2016-10-26 NOTE — Progress Notes (Signed)
Abstracted and sent to scan  

## 2016-10-28 ENCOUNTER — Encounter: Payer: Self-pay | Admitting: Internal Medicine

## 2016-10-28 ENCOUNTER — Ambulatory Visit (INDEPENDENT_AMBULATORY_CARE_PROVIDER_SITE_OTHER): Payer: Medicare Other | Admitting: General Practice

## 2016-10-28 DIAGNOSIS — Z7901 Long term (current) use of anticoagulants: Secondary | ICD-10-CM | POA: Diagnosis not present

## 2016-10-28 DIAGNOSIS — I480 Paroxysmal atrial fibrillation: Secondary | ICD-10-CM

## 2016-10-28 DIAGNOSIS — Z0000 Encounter for general adult medical examination without abnormal findings: Secondary | ICD-10-CM

## 2016-10-28 LAB — POCT INR: INR: 1.7

## 2016-10-28 NOTE — Patient Instructions (Signed)
Pre visit review using our clinic review tool, if applicable. No additional management support is needed unless otherwise documented below in the visit note. 

## 2016-11-01 ENCOUNTER — Encounter: Payer: Self-pay | Admitting: Internal Medicine

## 2016-11-01 ENCOUNTER — Other Ambulatory Visit: Payer: Self-pay

## 2016-11-01 MED ORDER — EMPAGLIFLOZIN 25 MG PO TABS: 25.0000 mg | ORAL_TABLET | Freq: Every day | ORAL | 5 refills | Status: DC

## 2016-11-02 ENCOUNTER — Telehealth: Payer: Self-pay | Admitting: Internal Medicine

## 2016-11-02 NOTE — Telephone Encounter (Signed)
Called the patient assistance, and they got verbal to submit the medication. Nothing else needed at this time.

## 2016-11-02 NOTE — Telephone Encounter (Signed)
Routing to you °

## 2016-11-02 NOTE — Telephone Encounter (Signed)
[employer] called to get clarification on the medication empagliflozin (JARDIANCE) 25 MG TABS tablet dosage. Call to advise at the number provided.

## 2016-11-07 ENCOUNTER — Other Ambulatory Visit: Payer: Self-pay | Admitting: Internal Medicine

## 2016-11-15 ENCOUNTER — Other Ambulatory Visit: Payer: Self-pay

## 2016-11-15 ENCOUNTER — Telehealth: Payer: Self-pay | Admitting: Internal Medicine

## 2016-11-15 NOTE — Telephone Encounter (Signed)
2x

## 2016-11-15 NOTE — Telephone Encounter (Signed)
OV states to take 10 mg before meals; so would be 2 times daily; or three?? Please advise, thank you!

## 2016-11-15 NOTE — Telephone Encounter (Signed)
Called and clarified to pharmacy

## 2016-11-15 NOTE — Telephone Encounter (Signed)
Patient needs clarification on her glipiZIDE (GLUCOTROL) 5 MG tablet directions.   Call the pharmacy to verify 2 tablets in the morning and 2 tablets in the evening.  [facility] - [place], [place] [phone] (Phone) [phone] (Fax)   Call patient to advise if necessary at [phone]

## 2016-11-16 ENCOUNTER — Encounter: Payer: Self-pay | Admitting: Internal Medicine

## 2016-11-17 ENCOUNTER — Other Ambulatory Visit: Payer: Self-pay

## 2016-11-17 MED ORDER — GLIPIZIDE 5 MG PO TABS: ORAL_TABLET | ORAL | 3 refills | Status: DC

## 2016-11-18 ENCOUNTER — Ambulatory Visit: Payer: Self-pay

## 2016-11-20 ENCOUNTER — Other Ambulatory Visit: Payer: Self-pay | Admitting: Internal Medicine

## 2016-11-22 DIAGNOSIS — G894 Chronic pain syndrome: Secondary | ICD-10-CM | POA: Diagnosis not present

## 2016-11-22 DIAGNOSIS — M150 Primary generalized (osteo)arthritis: Secondary | ICD-10-CM | POA: Diagnosis not present

## 2016-11-22 DIAGNOSIS — M47816 Spondylosis without myelopathy or radiculopathy, lumbar region: Secondary | ICD-10-CM | POA: Diagnosis not present

## 2016-11-22 DIAGNOSIS — Z79891 Long term (current) use of opiate analgesic: Secondary | ICD-10-CM | POA: Diagnosis not present

## 2016-11-24 ENCOUNTER — Ambulatory Visit
Admission: RE | Admit: 2016-11-24 | Discharge: 2016-11-24 | Disposition: A | Discharge status: Home / Self Care | Payer: Medicare Other | Source: Ambulatory Visit | Attending: Internal Medicine | Admitting: Internal Medicine

## 2016-11-24 ENCOUNTER — Encounter: Payer: Self-pay | Admitting: Internal Medicine

## 2016-11-24 DIAGNOSIS — E2839 Other primary ovarian failure: Secondary | ICD-10-CM

## 2016-11-24 DIAGNOSIS — Z780 Asymptomatic menopausal state: Secondary | ICD-10-CM | POA: Diagnosis not present

## 2016-11-24 LAB — HM DEXA SCAN: HM DEXA SCAN: NORMAL

## 2016-11-25 ENCOUNTER — Ambulatory Visit (INDEPENDENT_AMBULATORY_CARE_PROVIDER_SITE_OTHER): Payer: Medicare Other | Admitting: General Practice

## 2016-11-25 DIAGNOSIS — I480 Paroxysmal atrial fibrillation: Secondary | ICD-10-CM

## 2016-11-25 DIAGNOSIS — Z0000 Encounter for general adult medical examination without abnormal findings: Secondary | ICD-10-CM | POA: Diagnosis not present

## 2016-11-25 DIAGNOSIS — Z23 Encounter for immunization: Secondary | ICD-10-CM | POA: Diagnosis not present

## 2016-11-25 DIAGNOSIS — Z7901 Long term (current) use of anticoagulants: Secondary | ICD-10-CM

## 2016-11-25 LAB — POCT INR: INR: 1.8

## 2016-11-25 NOTE — Progress Notes (Signed)
I have reviewed and agree with the plan. 

## 2016-11-25 NOTE — Patient Instructions (Signed)
Pre visit review using our clinic review tool, if applicable. No additional management support is needed unless otherwise documented below in the visit note. 

## 2016-11-29 ENCOUNTER — Ambulatory Visit: Payer: Self-pay | Admitting: Internal Medicine

## 2016-11-30 ENCOUNTER — Encounter: Payer: Self-pay | Admitting: Cardiovascular Disease

## 2016-12-01 ENCOUNTER — Telehealth: Payer: Self-pay

## 2016-12-01 DIAGNOSIS — I480 Paroxysmal atrial fibrillation: Secondary | ICD-10-CM

## 2016-12-01 NOTE — Telephone Encounter (Signed)
Per MyChart message: "Sherren Mocha, MD  to Kristen [name]    8:21 AM  Yes - you should have this checked. It looks like your magnesium is chronically low and we will arrange a lab appt to have it checked.   thx - Dr Burt Knack"    Per DPR form, left message for patient to call and arrange magnesium lab draw at her convenience. Order placed.

## 2016-12-01 NOTE — Telephone Encounter (Signed)
Key: [id number]

## 2016-12-01 NOTE — Telephone Encounter (Signed)
Magnesium lab draw as been scheduled 10/2.

## 2016-12-02 ENCOUNTER — Encounter: Payer: Self-pay | Admitting: Internal Medicine

## 2016-12-02 ENCOUNTER — Other Ambulatory Visit: Payer: Self-pay | Admitting: Internal Medicine

## 2016-12-02 ENCOUNTER — Ambulatory Visit (INDEPENDENT_AMBULATORY_CARE_PROVIDER_SITE_OTHER): Payer: Medicare Other | Admitting: Internal Medicine

## 2016-12-02 VITALS — BP 124/82 | HR 91 | Wt 213.0 lb

## 2016-12-02 DIAGNOSIS — E1151 Type 2 diabetes mellitus with diabetic peripheral angiopathy without gangrene: Secondary | ICD-10-CM

## 2016-12-02 DIAGNOSIS — E785 Hyperlipidemia, unspecified: Secondary | ICD-10-CM | POA: Diagnosis not present

## 2016-12-02 MED ORDER — GLIPIZIDE 5 MG PO TABS: ORAL_TABLET | ORAL | 3 refills | Status: DC

## 2016-12-02 NOTE — Progress Notes (Addendum)
Patient ID: Kristen [name], female   DOB: February 11, 1946, 71 y.o.   MRN: [id number]  HPI: Kristen [name] is a 71 y.o.-year-old female, returning for DM2, dx 2008, insulin-dependent, uncontrolled, with complications (CAD, PAD, h/o CVD, peripheral neuropathy). Last visit 6 mo ago.  Last hemoglobin A1c was: Lab Results  Component Value Date   HGBA1C 7.3 10/13/2016   HGBA1C 8.0 06/14/2016   HGBA1C 8.0 03/15/2016   Pt is on a regimen of: - Victoza 1.8 mg in am - Metformin XR 1000 mg 2x a day - Glipizide 10 mg 2x a day before meals - Jardiance 25 mg before breakfast - started 06/2016 >> ran out for 3 weeks >> and restarted 11/2016. BMP normal 10/2016. Stopped Lantus 20 units in 12/2014. We tried NPH. She was on Levemir pen 30 units 2x a day - gets the med directly from [facility] - but had to stop b/c of price. She is now Metformin XR 1000 mg bid b/c N/D and palpitations with regular Metformin - retried with the same results She was on Bydureon in the past, then also on Novolog 60-70-75.  She tried Januvia before.   Pt checked her sugars 2-3x a day - reviewed her log: - am:   118-140, 184 >> 86, 122-149 >> 87, 105-135, 142 - 2h after b'fast: 130-195, 221 >> 131-177, 215 >> 141 - before lunch:  164-201, 220 >> 119-156 >> 130-154 - 2h after lunch: 170-240, 281 >> 136-242, 282  >> 115-165, 176 - before dinner: n/c >> 197-275 >> 141-284 >> 137-154 - after dinner:  198-314, 416 >> 141, 156-281, 363 >> 144-208, 240 - bedtime:  177-188 >> 138, 157-283, 413 (coke) >> 124 - nighttime: 56, 71-145 >> n/c >> 137 >> n/c Lowest: 86 >> 87 Highest: 413 >> 240  No CKD. Last BUN/creatinine was:  Lab Results  Component Value Date   BUN 15 10/13/2016   CREATININE 0.88 10/13/2016  On Telmisartan Last set of lipids: Lab Results  Component Value Date   CHOL 102 10/11/2016   HDL 35 (L) 10/11/2016   LDLCALC 42 10/11/2016   LDLDIRECT 116.0 11/28/2008   TRIG 126 10/11/2016   CHOLHDL 2.9  10/11/2016  On Lipitor Pt's last eye exam was in 10/2016 >> No DR. Dr. Bing Plume. + hypertensive retinopathy.  Denies numbness and tingling in her legs.   She also has a history of hypertension, paroxysmal A. Fib-on anticoagulation, depression, osteoarthritis - status post arthroscopic knee surgeries x2, status post lumbar laminectomy x3, hyperlipidemia, morbid obesity, chronic lymphadenitis, GERD, hypothyroidism, headaches, fibromyalgia.  ROS: Constitutional: + weight gain, + fatigue, + hot flushes Eyes: + blurry vision, no xerophthalmia ENT: no sore throat, no nodules palpated in throat, no dysphagia/odynophagia, no hoarseness Cardiovascular: no CP/+ SOB/+ palpitations/no leg swelling Respiratory: no cough/+ SOB Gastrointestinal: no N/V/D/C, + heartburn Musculoskeletal: + muscle aches/+ joint aches Skin: no rashes, + hair loss Neurological: no tremors/numbness/tingling/dizziness, no HA  I reviewed pt's medications, allergies, PMH, social hx, family hx, and changes were documented in the history of present illness. Otherwise, unchanged from my initial visit note.  PE: BP 124/82 (BP Location: Left Arm, Patient Position: Sitting)   Pulse 91   Wt 213 lb (96.6 kg)   SpO2 95%   BMI 40.25 kg/m  Body mass index is 40.25 kg/m. Wt Readings from Last 3 Encounters:  12/02/16 213 lb (96.6 kg)  10/12/16 216 lb (98 kg)  10/07/16 216 lb 3.2 oz (98.1 kg)   Constitutional: overweight, in NAD  Eyes: PERRLA, EOMI, no exophthalmos ENT: moist mucous membranes, no thyromegaly, no cervical lymphadenopathy Cardiovascular: RRR, No MRG Respiratory: CTA B Gastrointestinal: abdomen soft, NT, ND, BS+ Musculoskeletal: no deformities, strength intact in all 4 Skin: moist, warm, no rashes Neurological: no tremor with outstretched hands, DTR normal in all 4  ASSESSMENT: 1. DM2, insulin-dependent, uncontrolled, with complications - CAD- Echo 12/2010: EF 50-55%, status post CABG 2004: LIMA to LAD - cards: Dr.  Lia Foyer. Also has a LBBB.  - PAD - h/o stroke - peripheral neuropathy - on neurontin  2. HL  PLAN:  1. Patient with a h/o uncontrolled DM, prev. On insulin >> now on po meds + GLP1 R agonist. At last visit, we added Jardiance >> sugars are much better and her latest HbA1c from last month: better, at 7.3%! - encouraged her to continue with this - reviewed recent GFR and potassium >> not worsened after starting Jardiance - I advised her to Patient Instructions  Please continue: - Victoza 1.8mg  in am - Metformin XR 1000 mg 2x a day - Glipizide 10 mg before 2 meals - Jardiance 25 mg before breakfast.  Please return in 3 months with your sugar log.  - continue checking sugars at different times of the day - check 1x a day, rotating checks - advised for yearly eye exams >> she is UTD - UTD with flu shot - Return to clinic in 3 mo with sugar log   2. HL - LDL MUCH improved at last check, in 10/2016 - continue Lipitor >> no SEs  Philemon Kingdom, MD PhD [facility]

## 2016-12-02 NOTE — Patient Instructions (Addendum)
Patient Instructions  Please continue: - Victoza 1.8 mg in am - Metformin XR 1000 mg 2x a day - Glipizide 10 mg before 2 meals - Jardiance 25 mg before breakfast.  Please return in 3 months with your sugar log.

## 2016-12-06 ENCOUNTER — Other Ambulatory Visit (INDEPENDENT_AMBULATORY_CARE_PROVIDER_SITE_OTHER): Payer: Medicare Other

## 2016-12-06 DIAGNOSIS — I480 Paroxysmal atrial fibrillation: Secondary | ICD-10-CM | POA: Diagnosis not present

## 2016-12-07 LAB — MAGNESIUM: Magnesium: 2.1 mg/dL (ref 1.6–2.3)

## 2016-12-08 ENCOUNTER — Other Ambulatory Visit: Payer: Self-pay | Admitting: Internal Medicine

## 2016-12-08 NOTE — Telephone Encounter (Signed)
Patient has been informed.

## 2016-12-08 NOTE — Telephone Encounter (Signed)
Can you let pt know that PA was approved.

## 2016-12-14 ENCOUNTER — Other Ambulatory Visit: Payer: Self-pay | Admitting: Cardiovascular Disease

## 2016-12-16 ENCOUNTER — Ambulatory Visit (INDEPENDENT_AMBULATORY_CARE_PROVIDER_SITE_OTHER): Payer: Medicare Other | Admitting: General Practice

## 2016-12-16 DIAGNOSIS — Z7901 Long term (current) use of anticoagulants: Secondary | ICD-10-CM

## 2016-12-16 DIAGNOSIS — Z0000 Encounter for general adult medical examination without abnormal findings: Secondary | ICD-10-CM | POA: Diagnosis not present

## 2016-12-16 DIAGNOSIS — I480 Paroxysmal atrial fibrillation: Secondary | ICD-10-CM

## 2016-12-16 LAB — POCT INR: INR: 1.8

## 2016-12-16 NOTE — Progress Notes (Signed)
I have reviewed and agree with the plan. 

## 2016-12-16 NOTE — Patient Instructions (Signed)
Pre visit review using our clinic review tool, if applicable. No additional management support is needed unless otherwise documented below in the visit note. 

## 2016-12-19 ENCOUNTER — Ambulatory Visit (INDEPENDENT_AMBULATORY_CARE_PROVIDER_SITE_OTHER): Payer: Medicare Other | Admitting: Internal Medicine

## 2016-12-19 ENCOUNTER — Encounter: Payer: Self-pay | Admitting: Internal Medicine

## 2016-12-19 VITALS — BP 128/80 | HR 91 | Temp 98.4°F | Resp 16 | Ht 61.0 in | Wt 213.0 lb

## 2016-12-19 DIAGNOSIS — F09 Unspecified mental disorder due to known physiological condition: Secondary | ICD-10-CM

## 2016-12-19 DIAGNOSIS — F329 Major depressive disorder, single episode, unspecified: Secondary | ICD-10-CM

## 2016-12-19 DIAGNOSIS — R4789 Other speech disturbances: Secondary | ICD-10-CM | POA: Insufficient documentation

## 2016-12-19 DIAGNOSIS — F068 Other specified mental disorders due to known physiological condition: Secondary | ICD-10-CM

## 2016-12-19 MED ORDER — VORTIOXETINE HBR 10 MG PO TABS: 1.0000 | ORAL_TABLET | Freq: Every day | ORAL | 0 refills | Status: DC

## 2016-12-19 MED ORDER — VORTIOXETINE HBR 5 MG PO TABS: 1.0000 | ORAL_TABLET | Freq: Every day | ORAL | 0 refills | Status: DC

## 2016-12-19 NOTE — Patient Instructions (Signed)

## 2016-12-19 NOTE — Progress Notes (Signed)
Subjective:  Patient ID: Kristen [name], female    DOB: May 19, 1945  Age: 71 y.o. MRN: [id number]  CC: Depression   HPI Gabrille Kilbride presents for concerns about word finding difficulties over the last 6 months and worsening anxiety and depression.  Outpatient Medications Prior to Visit  Medication Sig Dispense Refill  . ALPRAZolam (XANAX) 1 MG tablet Take 1 tablet (1 mg total) by mouth 3 (three) times daily as needed for anxiety. 90 tablet 5  . atorvastatin (LIPITOR) 40 MG tablet TAKE 1 TABLET BY MOUTH DAILY 90 tablet 0  . cyclobenzaprine (FLEXERIL) 10 MG tablet Take 10 mg by mouth every 8 (eight) hours as needed for muscle spasms.    . empagliflozin (JARDIANCE) 25 MG TABS tablet Take 25 mg by mouth daily. 30 tablet 5  . Eszopiclone 3 MG TABS Take 1 tablet (3 mg total) by mouth at bedtime. Take immediately before bedtime 30 tablet 5  . furosemide (LASIX) 20 MG tablet TAKE 1 TABLET BY MOUTH DAILY 90 tablet 1  . gabapentin (NEURONTIN) 300 MG capsule TAKE ONE CAPSULE BY MOUTH THREE TIMES DAILY 90 capsule 5  . glipiZIDE (GLUCOTROL) 5 MG tablet TAKE 2 TABLET BY MOUTH DAILY BEFORE BREAKFAST AND 2 TABLETS BEFORE DINNER 120 tablet 3  . Liraglutide (VICTOZA) 18 MG/3ML SOPN Inject 1.8 mg into the skin every morning.     . lubiprostone (AMITIZA) 8 MCG capsule Take 1 capsule (8 mcg total) by mouth 2 (two) times daily with a meal. 30 capsule 11  . MAGNESIUM-OXIDE 400 (241.3 Mg) MG tablet TAKE 1 TABLET BY MOUTH TWICE DAILY 60 tablet 11  . metFORMIN (GLUCOPHAGE-XR) 500 MG 24 hr tablet TAKE 2 TABLETS BY MOUTH TWICE DAILY WITH MEALS 120 tablet 0  . metoprolol succinate (TOPROL-XL) 100 MG 24 hr tablet Take 1 tablet (100 mg total) by mouth 2 (two) times daily. 180 tablet 3  . nitroGLYCERIN (NITROSTAT) 0.4 MG SL tablet Place 0.4 mg under the tongue every 5 (five) minutes as needed for chest pain (x 3 doses). Reported on 05/08/2015    . ONE TOUCH ULTRA TEST test strip USE TO TEST BLOOD SUGAR FOUR  TIMES DAILY AS DIRECTED 250 each 2  . oxyCODONE-acetaminophen (PERCOCET) 10-325 MG tablet Take 1 tablet by mouth every 4 (four) hours as needed for pain.    . potassium chloride SA (K-DUR,KLOR-CON) 20 MEQ tablet Take 1 tablet (20 mEq total) by mouth 2 (two) times daily. 180 tablet 2  . spironolactone (ALDACTONE) 25 MG tablet Take 1 tablet (25 mg total) by mouth 2 (two) times daily. 180 tablet 1  . Suvorexant (BELSOMRA) 15 MG TABS Take 1 tablet by mouth at bedtime. 30 tablet 5  . telmisartan (MICARDIS) 40 MG tablet TAKE 1 TABLET BY MOUTH EVERY DAY 90 tablet 0  . warfarin (COUMADIN) 6 MG tablet TAKE BY MOUTH AS DIRECTED BY ANTICOAGULATION CLINIC 135 tablet 0  . potassium chloride SA (K-DUR,KLOR-CON) 20 MEQ tablet Take 20 mEq by mouth 2 (two) times daily.     No facility-administered medications prior to visit.     ROS Review of Systems  Constitutional: Negative.  Negative for fatigue.  HENT: Negative.  Negative for trouble swallowing.   Eyes: Negative.  Negative for visual disturbance.  Respiratory: Negative for cough, chest tightness, shortness of breath and wheezing.   Cardiovascular: Negative for chest pain, palpitations and leg swelling.  Gastrointestinal: Negative for abdominal pain, constipation, diarrhea, nausea and vomiting.  Endocrine: Negative.   Genitourinary: Negative.  Negative for difficulty urinating.  Musculoskeletal: Negative.   Skin: Negative.   Allergic/Immunologic: Negative.   Neurological: Positive for speech difficulty. Negative for dizziness, tremors, facial asymmetry, weakness, light-headedness, numbness and headaches.  Hematological: Negative for adenopathy. Does not bruise/bleed easily.  Psychiatric/Behavioral: Positive for decreased concentration, dysphoric mood and sleep disturbance. Negative for agitation, behavioral problems, confusion, hallucinations, self-injury and suicidal ideas. The patient is nervous/anxious. The patient is not hyperactive.      Objective:  BP 128/80 (BP Location: Left Arm, Patient Position: Sitting, Cuff Size: Large)   Pulse 91   Temp 98.4 F (36.9 C) (Oral)   Resp 16   Ht 5\' 1"  (1.549 m)   Wt 213 lb (96.6 kg)   SpO2 95%   BMI 40.25 kg/m   BP Readings from Last 3 Encounters:  12/19/16 128/80  12/02/16 124/82  10/12/16 128/72    Wt Readings from Last 3 Encounters:  12/19/16 213 lb (96.6 kg)  12/02/16 213 lb (96.6 kg)  10/12/16 216 lb (98 kg)    Physical Exam  Constitutional: She is oriented to person, place, and time. No distress.  HENT:  Mouth/Throat: Oropharynx is clear and moist. No oropharyngeal exudate.  Eyes: Conjunctivae are normal. Right eye exhibits no discharge. Left eye exhibits no discharge. No scleral icterus.  Neck: Normal range of motion. Neck supple. No JVD present. No thyromegaly present.  Cardiovascular: Normal rate, regular rhythm and intact distal pulses.  Exam reveals no gallop and no friction rub.   No murmur heard. Pulmonary/Chest: Effort normal and breath sounds normal. No respiratory distress. She has no wheezes. She has no rales. She exhibits no tenderness.  Abdominal: Soft. Bowel sounds are normal. She exhibits no distension and no mass. There is no tenderness. There is no rebound and no guarding.  Musculoskeletal: Normal range of motion. She exhibits no edema, tenderness or deformity.  Neurological: She is alert and oriented to person, place, and time. She has normal reflexes. She displays normal reflexes. No cranial nerve deficit. She exhibits normal muscle tone. Coordination normal.  Skin: Skin is warm and dry. No rash noted. She is not diaphoretic. No erythema. No pallor.  Psychiatric: Her behavior is normal. Judgment normal. Her mood appears anxious. Her speech is not rapid and/or pressured, not delayed and not tangential. She is not agitated and not withdrawn. Cognition and memory are normal. She exhibits a depressed mood. She expresses no homicidal and no suicidal  ideation. She expresses no suicidal plans and no homicidal plans.  Sad and tearful She is attentive.  Vitals reviewed.   Lab Results  Component Value Date   WBC 6.6 02/05/2016   HGB 14.7 02/05/2016   HCT 42.9 02/05/2016   PLT 323 02/05/2016   GLUCOSE 145 (H) 10/13/2016   CHOL 102 10/11/2016   TRIG 126 10/11/2016   HDL 35 (L) 10/11/2016   LDLDIRECT 116.0 11/28/2008   LDLCALC 42 10/11/2016   ALT 15 10/11/2016   AST 16 10/11/2016   NA 139 10/13/2016   K 4.2 10/13/2016   CL 107 10/13/2016   CREATININE 0.88 10/13/2016   BUN 15 10/13/2016   CO2 24 10/13/2016   TSH 4.41 10/13/2016   INR 1.8 12/16/2016   HGBA1C 7.3 10/13/2016   MICROALBUR 0.3 10/13/2016    Dexascan  Result Date: 11/24/2016 EXAM: DUAL X-RAY ABSORPTIOMETRY (DXA) FOR BONE MINERAL DENSITY IMPRESSION: Referring Physician:  Scarlette Calico PATIENT: Name: ESABELLA, STOCKINGER Patient ID: [id number] Birth Date: 12/28/1944 Height: 60.7 in. Sex: Female Measured: 11/24/2016 Weight:  199.0 lbs. Indications: Depression, Estrogen Deficient, Gabapentin, Height Loss (781.91), High risk medication use, Hx of tobacco use, Hysterectomy, Low Calcium Intake (269.3), Postmenopausal, Secondary Osteoporosis Fractures: None Treatments: None ASSESSMENT: The BMD measured at Femur Neck Right is 1.052 g/cm2 with a T-score of 0.1. This patient is considered normal according to [facility]) criteria. Lumbar spine was not utilized due to advanced degenerative changes and hardware. Patient does not meet criteria for FRAX assessment. Site Region Measured Date Measured Age YA BMD Significant CHANGE T-score DualFemur Neck Right 11/24/2016 69.9 0.3 1.052 g/cm2 Left Forearm Radius 33% 11/24/2016 69.9 0.7 0.930 g/cm2 World Health Organization [facility]) criteria for post-menopausal, Caucasian Women: Normal       T-score at or above -1 SD Osteopenia   T-score between -1 and -2.5 SD Osteoporosis T-score at or below -2.5 SD RECOMMENDATION: [facility] recommends that FDA-approved medical therapies be considered in postmenopausal women and men age 21 or older with a: 1. Hip or vertebral (clinical or morphometric) fracture. 2. T-score of <-2.5 at the spine or hip. 3. Ten-year fracture probability by FRAX of 3% or greater for hip fracture or 20% or greater for major osteoporotic fracture. All treatment decisions require clinical judgment and consideration of individual patient factors, including patient preferences, co-morbidities, previous drug use, risk factors not captured in the FRAX model (e.g. falls, vitamin D deficiency, increased bone turnover, interval significant decline in bone density) and possible under - or over-estimation of fracture risk by FRAX. All patients should ensure an adequate intake of dietary calcium (1200 mg/d) and vitamin D (800 IU daily) unless contraindicated. FOLLOW-UP: People with diagnosed cases of osteoporosis or at high risk for fracture should have regular bone mineral density tests. For patients eligible for Medicare, routine testing is allowed once every 2 years. The testing frequency can be increased to one year for patients who have rapidly progressing disease, those who are receiving or discontinuing medical therapy to restore bone mass, or have additional risk factors. I have reviewed this report, and agree with the above findings. [facility] Radiology Electronically Signed   By: Marijo Conception, M.D.   On: 11/24/2016 16:06    Assessment & Plan:   Maricarmen was seen today for depression.  Diagnoses and all orders for this visit:  Cognitive dysfunction associated with depression- I think her depression, anxiety, and cognitive function would improve if she were to take Trintellix. I've asked her to try the 5 mg dose for 3 weeks, will then increase to 10 mg for 3 weeks, and will consider advancing to 20 mg after the end of 6 weeks. -     vortioxetine HBr (TRINTELLIX) 5 MG TABS; Take 1 tablet (5  mg total) by mouth daily. -     vortioxetine HBr (TRINTELLIX) 10 MG TABS; Take 1 tablet (10 mg total) by mouth daily.  Word finding difficulty -     Ambulatory referral to Neurology   I am having [name] Dick start on vortioxetine HBr and vortioxetine HBr. I am also having her maintain her liraglutide, nitroGLYCERIN, lubiprostone, oxyCODONE-acetaminophen, cyclobenzaprine, MAGNESIUM-OXIDE, metoprolol succinate, telmisartan, ONE TOUCH ULTRA TEST, Suvorexant, ALPRAZolam, warfarin, atorvastatin, Eszopiclone, empagliflozin, gabapentin, furosemide, glipiZIDE, metFORMIN, spironolactone, and potassium chloride SA.  Meds ordered this encounter  Medications  . vortioxetine HBr (TRINTELLIX) 5 MG TABS    Sig: Take 1 tablet (5 mg total) by mouth daily.    Dispense:  21 tablet    Refill:  0  . vortioxetine HBr (TRINTELLIX) 10  MG TABS    Sig: Take 1 tablet (10 mg total) by mouth daily.    Dispense:  21 tablet    Refill:  0     Follow-up: Return in about 66 weeks (around 03/26/2018).  Scarlette Calico, MD

## 2016-12-20 ENCOUNTER — Encounter: Payer: Self-pay | Admitting: Internal Medicine

## 2016-12-21 ENCOUNTER — Encounter: Payer: Self-pay | Admitting: Internal Medicine

## 2016-12-22 DIAGNOSIS — M47816 Spondylosis without myelopathy or radiculopathy, lumbar region: Secondary | ICD-10-CM | POA: Diagnosis not present

## 2016-12-22 DIAGNOSIS — G894 Chronic pain syndrome: Secondary | ICD-10-CM | POA: Diagnosis not present

## 2016-12-22 DIAGNOSIS — M150 Primary generalized (osteo)arthritis: Secondary | ICD-10-CM | POA: Diagnosis not present

## 2016-12-22 DIAGNOSIS — Z79891 Long term (current) use of opiate analgesic: Secondary | ICD-10-CM | POA: Diagnosis not present

## 2016-12-29 ENCOUNTER — Other Ambulatory Visit: Payer: Self-pay | Admitting: Internal Medicine

## 2017-01-03 ENCOUNTER — Other Ambulatory Visit: Payer: Self-pay | Admitting: Internal Medicine

## 2017-01-05 ENCOUNTER — Encounter: Payer: Self-pay | Admitting: Cardiovascular Disease

## 2017-01-06 ENCOUNTER — Ambulatory Visit (INDEPENDENT_AMBULATORY_CARE_PROVIDER_SITE_OTHER): Payer: Medicare Other | Admitting: General Practice

## 2017-01-06 DIAGNOSIS — I480 Paroxysmal atrial fibrillation: Secondary | ICD-10-CM

## 2017-01-06 DIAGNOSIS — Z7901 Long term (current) use of anticoagulants: Secondary | ICD-10-CM

## 2017-01-06 DIAGNOSIS — Z0000 Encounter for general adult medical examination without abnormal findings: Secondary | ICD-10-CM

## 2017-01-06 LAB — POCT INR: INR: 2.7

## 2017-01-06 NOTE — Patient Instructions (Signed)
Pre visit review using our clinic review tool, if applicable. No additional management support is needed unless otherwise documented below in the visit note. 

## 2017-01-19 DIAGNOSIS — M47816 Spondylosis without myelopathy or radiculopathy, lumbar region: Secondary | ICD-10-CM | POA: Diagnosis not present

## 2017-01-19 DIAGNOSIS — M150 Primary generalized (osteo)arthritis: Secondary | ICD-10-CM | POA: Diagnosis not present

## 2017-01-19 DIAGNOSIS — Z79891 Long term (current) use of opiate analgesic: Secondary | ICD-10-CM | POA: Diagnosis not present

## 2017-01-19 DIAGNOSIS — G894 Chronic pain syndrome: Secondary | ICD-10-CM | POA: Diagnosis not present

## 2017-01-25 ENCOUNTER — Ambulatory Visit (HOSPITAL_COMMUNITY): Payer: Self-pay | Admitting: Psychiatry

## 2017-01-26 ENCOUNTER — Other Ambulatory Visit: Payer: Self-pay | Admitting: Internal Medicine

## 2017-01-31 ENCOUNTER — Ambulatory Visit (HOSPITAL_COMMUNITY): Payer: Medicare Other | Admitting: Psychiatry

## 2017-01-31 ENCOUNTER — Ambulatory Visit (INDEPENDENT_AMBULATORY_CARE_PROVIDER_SITE_OTHER): Payer: Medicare Other | Admitting: General Practice

## 2017-01-31 ENCOUNTER — Encounter (HOSPITAL_COMMUNITY): Payer: Self-pay | Admitting: Psychiatry

## 2017-01-31 VITALS — BP 112/80 | HR 94 | Ht 60.25 in | Wt 211.0 lb

## 2017-01-31 DIAGNOSIS — F4323 Adjustment disorder with mixed anxiety and depressed mood: Secondary | ICD-10-CM

## 2017-01-31 DIAGNOSIS — Z0000 Encounter for general adult medical examination without abnormal findings: Secondary | ICD-10-CM | POA: Diagnosis not present

## 2017-01-31 DIAGNOSIS — Z87891 Personal history of nicotine dependence: Secondary | ICD-10-CM | POA: Diagnosis not present

## 2017-01-31 DIAGNOSIS — I480 Paroxysmal atrial fibrillation: Secondary | ICD-10-CM | POA: Diagnosis not present

## 2017-01-31 DIAGNOSIS — Z7901 Long term (current) use of anticoagulants: Secondary | ICD-10-CM | POA: Diagnosis not present

## 2017-01-31 DIAGNOSIS — Z818 Family history of other mental and behavioral disorders: Secondary | ICD-10-CM

## 2017-01-31 LAB — POCT INR: INR: 2.6

## 2017-01-31 MED ORDER — ALPRAZOLAM 1 MG PO TABS: 1.0000 mg | ORAL_TABLET | Freq: Three times a day (TID) | ORAL | 5 refills | Status: DC | PRN

## 2017-01-31 NOTE — Patient Instructions (Addendum)
Pre visit review using our clinic review tool, if applicable. No additional management support is needed unless otherwise documented below in the visit note.  Continue to take 1/2 tablet all days except 1 tablet on Monday/Wednesdays and Fridays.  Re-check in 4 weeks 

## 2017-01-31 NOTE — Progress Notes (Signed)
Patient ID: Kristen [name], female   DOB: 01-21-1946, 71 y.o.   MRN: [id number] [facility] MD Progress Note  01/31/2017 2:33 PM Kristen [name]  MRN:  [id number] Subjective:  Scared Principal Problem: Adjustment disorder with anxious mood state Today the patient is doing well. The patient has been on long-term blood thinners. This is since her cardiac disease. Patient also had a right CVA point. Overall though her health is fairly well. The patient denies daily depression. She denies anhedonia. She enjoys reading watching TV. The patient is sleeping and eating quite well. She has good energy. She is a good sense of worth. She is concentrating well. She denies being suicidal now and never has been. Financially she is stable. It didn't no changes in her health. She continues to have a difficult relationship with her husband who has PTSD from [place]. She manages to get along. The patient does not exercise very much. She is very bad knees. The patient is functioning very well. She continues to drive. She takes her Xanax just as prescribed. Her anxiety is well-controlled.at one point the last few months she visited her primary care doctor and said that she's been feeling depressed for a few days. He then began her on Trintelix which she took for about 2 weeks and discontinued it.  Past Psychiatric History:   Past Medical History:  Past Medical History:  Diagnosis Date  . Arthritis   . Blood transfusion without reported diagnosis   . Bursitis   . CAD (coronary artery disease)    a. s/p CABG 2004.b. stable cath 2014 demonstrating stable CAD and continued patency of her LIMA graft.  . Chronic anticoagulation    on coumadin  . Depression   . Diabetes mellitus   . Diastolic dysfunction    per echo in October 2012 with EF 50 to 55%  . Fibromyalgia   . GERD (gastroesophageal reflux disease) 10/23/2003  . Headache(784.0)   . Hyperlipidemia   . Hypertension   . Hypokalemia   . LBBB (left bundle  branch block)   . Lumbar back pain   . LV dysfunction    a. EF 45% by cath 2014. b. EF 50-55% by technically difficult echo in 08/2014.  Marland Kitchen Lymphadenitis   . Morbid obesity ([place])    a. Sleep study negative for significant OSA in 11/2014.  Marland Kitchen PAF (paroxysmal atrial fibrillation) ([place])   . Stroke [facility]) 2004   affected speech per pt    Past Surgical History:  Procedure Laterality Date  . ABDOMINAL HYSTERECTOMY    . ANGIOPLASTY  laminectomy  . CHOLECYSTECTOMY    . CORONARY ARTERY BYPASS GRAFT     LIMA to LAD   . KNEE ARTHROSCOPY    . LEFT HEART CATHETERIZATION WITH CORONARY/GRAFT ANGIOGRAM  12/07/2012   Procedure: LEFT HEART CATHETERIZATION WITH Beatrix Fetters;  Surgeon: Blane Ohara, MD;  Location: [facility] CATH LAB;  Service: Cardiovascular;;  . LUMBAR LAMINECTOMY     x3  . SPHINCTEROTOMY    . TONSILLECTOMY     Family History:  Family History  Problem Relation Age of Onset  . Heart attack Father   . Heart disease Father   . Stroke Mother   . Kidney disease Unknown   . Stroke Unknown   . Arthritis Unknown   . Hypertension Unknown   . Diabetes Unknown   . Colon cancer Paternal Uncle   . Depression Sister   . Anxiety disorder Maternal Aunt    Family Psychiatric  History:  Social History:  Social History   Substance and Sexual Activity  Alcohol Use No     Social History   Substance and Sexual Activity  Drug Use No    Social History   Socioeconomic History  . Marital status: Legally Separated    Spouse name: None  . Number of children: None  . Years of education: None  . Highest education level: None  Social Needs  . Financial resource strain: None  . Food insecurity - worry: None  . Food insecurity - inability: None  . Transportation needs - medical: None  . Transportation needs - non-medical: None  Occupational History  . None  Tobacco Use  . Smoking status: Former Smoker    Last attempt to quit: 03/07/2001    Years since quitting: 15.9  .  Smokeless tobacco: Never Used  Substance and Sexual Activity  . Alcohol use: No  . Drug use: No  . Sexual activity: Not Currently  Other Topics Concern  . None  Social History Narrative   Lives locally, has help available if needed.   Additional Social History:                         Sleep: Fair  Appetite:  Fair  Current Medications: Current Outpatient Medications  Medication Sig Dispense Refill  . ALPRAZolam (XANAX) 1 MG tablet Take 1 tablet (1 mg total) by mouth 3 (three) times daily as needed for anxiety. 90 tablet 5  . atorvastatin (LIPITOR) 40 MG tablet TAKE 1 TABLET BY MOUTH DAILY 90 tablet 1  . cyclobenzaprine (FLEXERIL) 10 MG tablet Take 10 mg by mouth every 8 (eight) hours as needed for muscle spasms.    . empagliflozin (JARDIANCE) 25 MG TABS tablet Take 25 mg by mouth daily. 30 tablet 5  . furosemide (LASIX) 20 MG tablet TAKE 1 TABLET BY MOUTH DAILY 90 tablet 1  . gabapentin (NEURONTIN) 300 MG capsule TAKE ONE CAPSULE BY MOUTH THREE TIMES DAILY 90 capsule 5  . glipiZIDE (GLUCOTROL) 5 MG tablet TAKE 2 TABLET BY MOUTH DAILY BEFORE BREAKFAST AND 2 TABLETS BEFORE DINNER 120 tablet 3  . Liraglutide (VICTOZA) 18 MG/3ML SOPN Inject 1.8 mg into the skin every morning.     . lubiprostone (AMITIZA) 8 MCG capsule Take 1 capsule (8 mcg total) by mouth 2 (two) times daily with a meal. 30 capsule 11  . MAGNESIUM-OXIDE 400 (241.3 Mg) MG tablet TAKE 1 TABLET BY MOUTH TWICE DAILY 60 tablet 11  . metFORMIN (GLUCOPHAGE-XR) 500 MG 24 hr tablet TAKE 2 TABLETS BY MOUTH TWICE DAILY WITH MEALS 120 tablet 0  . metoprolol succinate (TOPROL-XL) 100 MG 24 hr tablet Take 1 tablet (100 mg total) by mouth 2 (two) times daily. 180 tablet 3  . nitroGLYCERIN (NITROSTAT) 0.4 MG SL tablet Place 0.4 mg under the tongue every 5 (five) minutes as needed for chest pain (x 3 doses). Reported on 05/08/2015    . ONE TOUCH ULTRA TEST test strip USE TO TEST BLOOD SUGAR FOUR TIMES DAILY AS DIRECTED 250 each 2   . oxyCODONE-acetaminophen (PERCOCET) 10-325 MG tablet Take 1 tablet by mouth every 4 (four) hours as needed for pain.    . potassium chloride SA (K-DUR,KLOR-CON) 20 MEQ tablet Take 1 tablet (20 mEq total) by mouth 2 (two) times daily. 180 tablet 2  . spironolactone (ALDACTONE) 25 MG tablet Take 1 tablet (25 mg total) by mouth 2 (two) times daily. 180 tablet 1  .  telmisartan (MICARDIS) 40 MG tablet TAKE 1 TABLET BY MOUTH EVERY DAY 90 tablet 0  . warfarin (COUMADIN) 6 MG tablet TAKE BY MOUTH AS DIRECTED BY ANTICOAGULATION CLINIC 135 tablet 0  . Eszopiclone 3 MG TABS Take 1 tablet (3 mg total) by mouth at bedtime. Take immediately before bedtime (Patient not taking: Reported on 01/31/2017) 30 tablet 5  . Suvorexant (BELSOMRA) 15 MG TABS Take 1 tablet by mouth at bedtime. (Patient not taking: Reported on 01/31/2017) 30 tablet 5  . vortioxetine HBr (TRINTELLIX) 10 MG TABS Take 1 tablet (10 mg total) by mouth daily. (Patient not taking: Reported on 01/31/2017) 21 tablet 0  . vortioxetine HBr (TRINTELLIX) 5 MG TABS Take 1 tablet (5 mg total) by mouth daily. (Patient not taking: Reported on 01/31/2017) 21 tablet 0   No current facility-administered medications for this visit.     Lab Results:  Results for orders placed or performed in visit on 01/31/17 (from the past 48 hour(s))  POCT INR     Status: None   Collection Time: 01/31/17 12:00 AM  Result Value Ref Range   INR 2.6    *Note: Due to a large number of results and/or encounters for the requested time period, some results have not been displayed. A complete set of results can be found in Results Review.    Physical Findings: AIMS:  , ,  ,  ,    CIWA:    COWS:     Musculoskeletal: Strength & Muscle Tone: within normal limits Gait & Station: normal Patient leans: Right  Psychiatric Specialty Exam: ROS  Blood pressure 112/80, pulse 94, height 5' 0.25" (1.53 m), weight 211 lb (95.7 kg), SpO2 95 %.Body mass index is 40.87 kg/m.   General Appearance: NA  Eye Contact::  Good  Speech:  Clear and Coherent  Volume:  Normal  Mood:  NA  Affect:  Congruent  Thought Process:  Coherent  Orientation:  Full (Time, Place, and Person)  Thought Content:  WDL  Suicidal Thoughts:  No  Homicidal Thoughts:  No  Memory:  NA  Judgement:  Good  Insight:  Good  Psychomotor Activity:  Normal  Concentration:  Good  Recall:  Good  Fund of Knowledge:Fair  Language: Good  Akathisia:  No  Handed:  Left  AIMS (if indicated):     Assets:  Desire for Improvement  ADL's:  Intact  Cognition: WNL  Sleep:      Treatment Plan Summary:    Today the patient is doing well.Her major problem is chronic anxiety. This is mainly fueledby a conflictual relationship with her husband. Generally her family is doing well. The patient is no use of alcohol or drugs. Patient denies chest pain or shortness of breath or any neurological symptoms. Today we spent more than 50% of the time talking about her condition and the benefits of her Xanax. Patient has had no falls. Cognitively she is very. This patient return to see me in 5 months

## 2017-02-21 ENCOUNTER — Ambulatory Visit: Payer: Medicare Other | Admitting: Neurology

## 2017-02-22 DIAGNOSIS — M47816 Spondylosis without myelopathy or radiculopathy, lumbar region: Secondary | ICD-10-CM | POA: Diagnosis not present

## 2017-02-22 DIAGNOSIS — Z79891 Long term (current) use of opiate analgesic: Secondary | ICD-10-CM | POA: Diagnosis not present

## 2017-02-22 DIAGNOSIS — M150 Primary generalized (osteo)arthritis: Secondary | ICD-10-CM | POA: Diagnosis not present

## 2017-02-22 DIAGNOSIS — G894 Chronic pain syndrome: Secondary | ICD-10-CM | POA: Diagnosis not present

## 2017-02-25 ENCOUNTER — Other Ambulatory Visit: Payer: Self-pay | Admitting: Internal Medicine

## 2017-03-03 ENCOUNTER — Ambulatory Visit: Payer: Medicare Other | Admitting: Internal Medicine

## 2017-03-03 ENCOUNTER — Encounter: Payer: Self-pay | Admitting: Internal Medicine

## 2017-03-03 ENCOUNTER — Ambulatory Visit (INDEPENDENT_AMBULATORY_CARE_PROVIDER_SITE_OTHER): Payer: Medicare Other | Admitting: General Practice

## 2017-03-03 VITALS — BP 128/82 | HR 92 | Ht 61.0 in | Wt 212.8 lb

## 2017-03-03 DIAGNOSIS — E1169 Type 2 diabetes mellitus with other specified complication: Secondary | ICD-10-CM

## 2017-03-03 DIAGNOSIS — E785 Hyperlipidemia, unspecified: Secondary | ICD-10-CM

## 2017-03-03 DIAGNOSIS — Z0000 Encounter for general adult medical examination without abnormal findings: Secondary | ICD-10-CM

## 2017-03-03 DIAGNOSIS — E1151 Type 2 diabetes mellitus with diabetic peripheral angiopathy without gangrene: Secondary | ICD-10-CM

## 2017-03-03 DIAGNOSIS — Z7901 Long term (current) use of anticoagulants: Secondary | ICD-10-CM | POA: Diagnosis not present

## 2017-03-03 DIAGNOSIS — I480 Paroxysmal atrial fibrillation: Secondary | ICD-10-CM | POA: Diagnosis not present

## 2017-03-03 LAB — POCT INR: INR: 3.3

## 2017-03-03 LAB — POCT GLYCOSYLATED HEMOGLOBIN (HGB A1C): Hemoglobin A1C: 7.9

## 2017-03-03 MED ORDER — METFORMIN HCL ER 500 MG PO TB24: 1000.0000 mg | ORAL_TABLET | Freq: Two times a day (BID) | ORAL | 3 refills | Status: DC

## 2017-03-03 NOTE — Patient Instructions (Addendum)
Please continue: - Victoza 1.8 mg in am - Metformin ER 1000 mg 2x a day - Glipizide 10 mg before 2 meals  Please move: - Jardiance 25 mg before breakfast.  Our goal HbA1c is 7.0%.  Please return in 3 months with your sugar log.

## 2017-03-03 NOTE — Patient Instructions (Addendum)
Pre visit review using our clinic review tool, if applicable. No additional management support is needed unless otherwise documented below in the visit note.  Skip coumadin today (12/28) and then continue to take 1/2 tablet all days except 1 tablet on Monday/Wednesdays and Fridays.  Re-check in 4 weeks.

## 2017-03-03 NOTE — Addendum Note (Signed)
Addended by: Drucilla Schmidt on: 03/03/2017 02:25 PM   Modules accepted: Orders

## 2017-03-03 NOTE — Progress Notes (Signed)
Patient ID: Kristen [name], female   DOB: Dec 05, 1945, 71 y.o.   MRN: [id number]  HPI: Kristen [name] is a 71 y.o.-year-old female, returning for DM2, dx 2008, insulin-dependent, uncontrolled, with complications (CAD, PAD, h/o CVD, h/o stroke, peripheral neuropathy). Last visit 3 months ago.  Last hemoglobin A1c was: Lab Results  Component Value Date   HGBA1C 7.3 10/13/2016   HGBA1C 8.0 06/14/2016   HGBA1C 8.0 03/15/2016   Pt is on a regimen of: - Victoza 1.8 mg in am - Metformin XR 1000 mg 2x a day - Glipizide 10 mg 2x a day before meals - Jardiance 25 mg before breakfast - started 06/2016.  Stopped Lantus 20 units in 12/2014. We tried NPH. She was on Levemir pen 30 units 2x a day - gets the med directly from [facility] - but had to stop b/c of price. She is now Metformin XR 1000 mg bid b/c N/D and palpitations with regular Metformin - retried with the same results She was on Bydureon in the past, then also on Novolog 60-70-75.  She tried Januvia before.   Pt checks her sugars 2-3 times a day - forgot log: - am:  86, 122-149 >> 87, 105-135, 142 >> 87-150, 200 x1 (ate during the night) - 2h after b'fast:131-177, 215 >> 141 >> n/c - before lunch: 119-156 >> 130-154 >> n/c - 2h after lunch: 136-242, 282  >> 115-165, 176 >> 150-160 - before dinner: 141-284 >> 137-154 >> n/c - after dinner: 141, 156-281, 363 >> 144-208, 240 >> 180s - bedtime: 138, 157-283, 413 (coke) >> 124 >> 90-130 - nighttime: 56, 71-145 >> n/c >> 137 >> n/c Lowest: 86 >> 87 >> 87 Highest: 413 >> 240 >> 200  No CKD. Last BUN/creatinine was:  Lab Results  Component Value Date   BUN 15 10/13/2016   CREATININE 0.88 10/13/2016  On telmisartan. + HL; last set of lipids: Lab Results  Component Value Date   CHOL 102 10/11/2016   HDL 35 (L) 10/11/2016   LDLCALC 42 10/11/2016   LDLDIRECT 116.0 11/28/2008   TRIG 126 10/11/2016   CHOLHDL 2.9 10/11/2016  On Lipitor. Pt's last eye exam was in  10/2016: No DR. Dr. Bing Plume.  She has hypertensive retinopathy. No numbness and tingling in her legs.   She also has a history of HTN, paroxysmal A. Fib-on anticoagulation, depression, osteoarthritis - status post arthroscopic knee surgeries x2, status post lumbar laminectomy x3, morbid obesity, chronic lymphadenitis, GERD, hypothyroidism, headaches, fibromyalgia.  ROS: Constitutional: + weight gain/no weight loss, no fatigue, + hot flushes, no subjective hypothermia Eyes: + blurry vision, no xerophthalmia ENT: no sore throat, no nodules palpated in throat, no dysphagia, no odynophagia, no hoarseness Cardiovascular: no CP/+ SOB/no palpitations/no leg swelling Respiratory: no cough/+ SOB/no wheezing Gastrointestinal: no N/no V/no D/+ C/no acid reflux Musculoskeletal: + muscle aches/+ joint aches Skin: no rashes, no hair loss Neurological: no tremors/no numbness/no tingling/no dizziness  I reviewed pt's medications, allergies, PMH, social hx, family hx, and changes were documented in the history of present illness. Otherwise, unchanged from my initial visit note. Dr. Casimiro Needle d/c'ed Trintellix 2/2 mood change.  PE: BP 128/82   Pulse 92   Ht 5\' 1"  (1.549 m)   Wt 212 lb 12.8 oz (96.5 kg)   SpO2 96%   BMI 40.21 kg/m  Body mass index is 40.21 kg/m. Wt Readings from Last 3 Encounters:  03/03/17 212 lb 12.8 oz (96.5 kg)  12/19/16 213 lb (96.6 kg)  12/02/16  213 lb (96.6 kg)   Constitutional: overweight, in NAD Eyes: PERRLA, EOMI, no exophthalmos ENT: moist mucous membranes, no thyromegaly, no cervical lymphadenopathy Cardiovascular: tachycardia, RR, No MRG Respiratory: CTA B Gastrointestinal: abdomen soft, NT, ND, BS+ Musculoskeletal: no deformities, strength intact in all 4 Skin: moist, warm, no rashes Neurological: no tremor with outstretched hands, DTR normal in all 4  ASSESSMENT: 1. DM2, insulin-dependent, uncontrolled, with complications - CAD- Echo 12/2010: EF 50-55%, status  post CABG 2004: LIMA to LAD - cards: Dr. Lia Foyer. Also has a LBBB.  - PAD - h/o stroke - peripheral neuropathy -on Neurontin  2. HL  PLAN:  1. Patient with history of uncontrolled diabetes, previously on insulin, now only on p.o. medicines and GLP-1 receptor agonist.  Sugars were initially much better after we added Jardiance earlier this year.  Latest HbA1c reviewed: 7.3%, improved. - However, sugars are higher now, around the [place]. She has dietary indiscretions and eating at night. Today, HbA1c is 7.9% (higher) - we discussed about adding insulin but she refuses for now >> wants to work on her diet and return in 3 months. Discussed about ways to improve diet. - I advised her to Please continue: - Victoza 1.8 mg in am - Metformin ER 1000 mg 2x a day - Glipizide 10 mg before 2 meals  Please move: - Jardiance 25 mg before breakfast.  Our goal HbA1c is 7.0%.  Please return in 3 months with your sugar log.  - continue checking sugars at different times of the day - check 1x a day, rotating checks - advised for yearly eye exams >> she is UTD - Return to clinic in 3 mo with sugar log    2. HL -Reviewed latest lipid panel from 10/2016, LDL much improved. -Continue Lipitor: No side effects  Philemon Kingdom, MD PhD [facility]

## 2017-03-08 ENCOUNTER — Other Ambulatory Visit: Payer: Self-pay | Admitting: Internal Medicine

## 2017-03-14 ENCOUNTER — Encounter: Payer: Self-pay | Admitting: Internal Medicine

## 2017-03-20 ENCOUNTER — Other Ambulatory Visit: Payer: Self-pay | Admitting: Family Medicine

## 2017-03-21 ENCOUNTER — Other Ambulatory Visit: Payer: Self-pay | Admitting: Internal Medicine

## 2017-03-21 MED ORDER — ONETOUCH ULTRA 2 W/DEVICE KIT: PACK | 0 refills | Status: DC

## 2017-03-23 ENCOUNTER — Encounter: Payer: Self-pay | Admitting: Neurology

## 2017-03-23 ENCOUNTER — Ambulatory Visit: Payer: Medicare Other | Admitting: Neurology

## 2017-03-23 VITALS — BP 148/98 | HR 100 | Ht 61.0 in | Wt 211.0 lb

## 2017-03-23 DIAGNOSIS — Z8673 Personal history of transient ischemic attack (TIA), and cerebral infarction without residual deficits: Secondary | ICD-10-CM

## 2017-03-23 DIAGNOSIS — R4789 Other speech disturbances: Secondary | ICD-10-CM | POA: Diagnosis not present

## 2017-03-23 DIAGNOSIS — M47816 Spondylosis without myelopathy or radiculopathy, lumbar region: Secondary | ICD-10-CM | POA: Diagnosis not present

## 2017-03-23 DIAGNOSIS — Z79891 Long term (current) use of opiate analgesic: Secondary | ICD-10-CM | POA: Diagnosis not present

## 2017-03-23 DIAGNOSIS — G894 Chronic pain syndrome: Secondary | ICD-10-CM | POA: Diagnosis not present

## 2017-03-23 DIAGNOSIS — M150 Primary generalized (osteo)arthritis: Secondary | ICD-10-CM | POA: Diagnosis not present

## 2017-03-23 DIAGNOSIS — R413 Other amnesia: Secondary | ICD-10-CM
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
## 2017-03-23 NOTE — Patient Instructions (Addendum)
We will investigate your word finding difficulty with a brain scan, called MRI and call you with the test results. We will have to schedule you for this on a separate date. This test requires authorization from your insurance, and we will take care of the insurance process. We will request a formal neuropsychological test (aka cognitive testing) for your memory complaints. This requires a referral to a trained and licensed neuropsychologist and will be a separate appointment at a different clinic.  You have vascular risk factors, including history of stroke, heart disease, obesity, diabetes and high cholesterol. Please try to stay well hydrated, well rested, try to exercise in the form of walking if possible. We will monitor your memory. I would like for you to return for a recheck in 6 months, you can see one of our nurse practitioners, we will be in touch as to your test results in the interim.

## 2017-03-24 ENCOUNTER — Other Ambulatory Visit: Payer: Self-pay | Admitting: Internal Medicine

## 2017-03-24 ENCOUNTER — Ambulatory Visit (INDEPENDENT_AMBULATORY_CARE_PROVIDER_SITE_OTHER): Payer: Medicare Other | Admitting: General Practice

## 2017-03-24 DIAGNOSIS — I480 Paroxysmal atrial fibrillation: Secondary | ICD-10-CM

## 2017-03-24 DIAGNOSIS — Z7901 Long term (current) use of anticoagulants: Secondary | ICD-10-CM

## 2017-03-24 LAB — POCT INR: INR: 2.3

## 2017-03-24 NOTE — Patient Instructions (Addendum)
Pre visit review using our clinic review tool, if applicable. No additional management support is needed unless otherwise documented below in the visit note.  Continue to take 1/2 tablet all days except 1 tablet on Monday/Wednesdays and Fridays.  Re-check in 4 weeks 

## 2017-03-27 ENCOUNTER — Encounter: Payer: Self-pay | Admitting: Psychology

## 2017-03-27 ENCOUNTER — Telehealth: Payer: Self-pay | Admitting: Cardiovascular Disease
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
## 2017-03-27 NOTE — Telephone Encounter (Signed)
Mrs. Anne Ng is calling because she is needing to get an allergy medication and she wants to know if the medication will be heart safe .  She says the pharmacist recommended Zyrtec.

## 2017-03-31 ENCOUNTER — Other Ambulatory Visit: Payer: Self-pay | Admitting: Cardiovascular Disease

## 2017-04-04 ENCOUNTER — Other Ambulatory Visit: Payer: Self-pay | Admitting: Internal Medicine

## 2017-04-11 ENCOUNTER — Telehealth: Payer: Self-pay | Admitting: Neurology

## 2017-04-11 NOTE — Telephone Encounter (Signed)
Please call pt she would like to schedule her MRI dg

## 2017-04-11 NOTE — Telephone Encounter (Signed)
[facility] has try to reach out to the patient on 03/30/17.

## 2017-04-12 ENCOUNTER — Telehealth: Payer: Self-pay | Admitting: Cardiovascular Disease
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
## 2017-04-12 NOTE — Telephone Encounter (Signed)
Mrs. Kristen [name] is calling because her heart rate continues to go up and when it does it makes her very tired . She does take Xanax and do take her dosage as instructed . It sometimes eases it , but not all the time . Please call   Thanks

## 2017-04-12 NOTE — Telephone Encounter (Signed)
I think Toprol is the best medication for slowing her heart rate. If elevated heart rate is related to stress and anxiety I wouldn't recommend increasing cardiac medicines further. Happy to review with her when she returns for follow-up. thx

## 2017-04-13 NOTE — Telephone Encounter (Signed)
Informed patient of Dr. Antionette Char comments. She states she feels much better today and was thankful for the check-in.

## 2017-04-20 ENCOUNTER — Encounter: Payer: Self-pay | Admitting: Cardiovascular Disease

## 2017-04-20 DIAGNOSIS — M47816 Spondylosis without myelopathy or radiculopathy, lumbar region: Secondary | ICD-10-CM | POA: Diagnosis not present

## 2017-04-20 DIAGNOSIS — M150 Primary generalized (osteo)arthritis: Secondary | ICD-10-CM | POA: Diagnosis not present

## 2017-04-20 DIAGNOSIS — G894 Chronic pain syndrome: Secondary | ICD-10-CM | POA: Diagnosis not present

## 2017-04-20 DIAGNOSIS — Z79891 Long term (current) use of opiate analgesic: Secondary | ICD-10-CM | POA: Diagnosis not present

## 2017-04-21 ENCOUNTER — Ambulatory Visit: Payer: Self-pay

## 2017-04-24 ENCOUNTER — Ambulatory Visit
Admission: RE | Admit: 2017-04-24 | Discharge: 2017-04-24 | Disposition: A | Discharge status: Home / Self Care | Payer: Medicare Other | Source: Ambulatory Visit | Attending: Neurology | Admitting: Neurology

## 2017-04-24 DIAGNOSIS — R4789 Other speech disturbances: Secondary | ICD-10-CM

## 2017-04-24 DIAGNOSIS — R413 Other amnesia: Secondary | ICD-10-CM

## 2017-04-24 DIAGNOSIS — Z8673 Personal history of transient ischemic attack (TIA), and cerebral infarction without residual deficits: Secondary | ICD-10-CM

## 2017-04-24 NOTE — Progress Notes (Signed)
Please call patient regarding the recent brain MRI: The brain scan showed a normal structure of the brain and mild volume loss which we call atrophy. There were changes in the deeper structures of the brain, which we call white matter changes or microvascular changes. These were reported as mild in Her case. These are tiny white spots, that occur with time and are seen in a variety of conditions, including with normal aging, chronic hypertension, chronic headaches, especially migraine HAs, chronic diabetes, chronic hyperlipidemia. These are not strokes and no mass or lesion were seen which is reassuring.  I would recommend, patient pursue a full eye exam with visual field testing as one incidental finding on the brain scan may suggest increase in fluid pressure around the brain this is nonspecific finding and I have no reason to suspect increased fluid pressure on the brain. Nevertheless, it would be a good idea for her to have a full eye exam with her eye doctor. Again, there were no acute findings, such as a stroke, or mass or blood products. No further action is required on this test at this time, other than re-enforcing the importance of good blood pressure control, good cholesterol control, good blood sugar control, and weight management. Please remind patient to keep any upcoming appointments or tests and to call us with any interim questions, concerns, problems or updates. Thanks,  [name], MD, PhD

## 2017-04-25 ENCOUNTER — Telehealth: Payer: Self-pay

## 2017-04-25 NOTE — Telephone Encounter (Signed)
Pt returned Rn's call °

## 2017-04-25 NOTE — Telephone Encounter (Signed)
I called pt and explained her MRI results. Pt reports that she had a complete eye exam with visual field testing in November or December but can't remember the name of the doctor or the practice where it was completed, just that it was close to [place] assured me that it was a complete eye exam with visual field testing and it was normal. I advised the pt of the importance of good BP control, blood sugar control, cholesterol control, and weight management. I reminded pt of her upcoming appts with Dr. Si Raider and with Jinny Blossom, NP in July 2019. Pt verbalized understanding of results. Pt had no questions at this time but was encouraged to call back if questions arise.

## 2017-04-25 NOTE — Telephone Encounter (Signed)
I called pt to discuss her MRI results. No answer, left a message asking her to call me back. 

## 2017-04-25 NOTE — Telephone Encounter (Signed)
-----   Message from [name], MD sent at 04/24/2017  5:58 PM EST ----- Please call patient regarding the recent brain MRI: The brain scan showed a normal structure of the brain and mild volume loss which we call atrophy. There were changes in the deeper structures of the brain, which we call white matter changes or microvascular changes. These were reported as mild in Her case. These are tiny white spots, that occur with time and are seen in a variety of conditions, including with normal aging, chronic hypertension, chronic headaches, especially migraine HAs, chronic diabetes, chronic hyperlipidemia. These are not strokes and no mass or lesion were seen which is reassuring.  I would recommend, patient pursue a full eye exam with visual field testing as one incidental finding on the brain scan may suggest increase in fluid pressure around the brain this is nonspecific finding and I have no reason to suspect increased fluid pressure on the brain. Nevertheless, it would be a good idea for her to have a full eye exam with her eye doctor. Again, there were no acute findings, such as a stroke, or mass or blood products. No further action is required on this test at this time, other than re-enforcing the importance of good blood pressure control, good cholesterol control, good blood sugar control, and weight management. Please remind patient to keep any upcoming appointments or tests and to call us with any interim questions, concerns, problems or updates. Thanks,  [name], MD, PhD

## 2017-04-27 ENCOUNTER — Ambulatory Visit: Payer: Self-pay | Admitting: Cardiovascular Disease

## 2017-04-27 ENCOUNTER — Other Ambulatory Visit: Payer: Self-pay | Admitting: Internal Medicine

## 2017-04-28 ENCOUNTER — Ambulatory Visit (INDEPENDENT_AMBULATORY_CARE_PROVIDER_SITE_OTHER): Payer: Medicare Other | Admitting: General Practice

## 2017-04-28 DIAGNOSIS — Z7901 Long term (current) use of anticoagulants: Secondary | ICD-10-CM

## 2017-04-28 DIAGNOSIS — I480 Paroxysmal atrial fibrillation: Secondary | ICD-10-CM | POA: Diagnosis not present

## 2017-04-28 LAB — POCT INR: INR: 2.5

## 2017-04-28 NOTE — Patient Instructions (Addendum)
Pre visit review using our clinic review tool, if applicable. No additional management support is needed unless otherwise documented below in the visit note.  Continue to take 1/2 tablet all days except 1 tablet on Monday/Wednesdays and Fridays.  Re-check in 4 weeks 

## 2017-05-01 ENCOUNTER — Encounter: Payer: Self-pay | Admitting: Cardiovascular Disease

## 2017-05-01 ENCOUNTER — Ambulatory Visit: Payer: Medicare Other | Admitting: Cardiovascular Disease

## 2017-05-01 VITALS — BP 118/82 | HR 78 | Ht 62.0 in | Wt 205.8 lb

## 2017-05-01 DIAGNOSIS — I480 Paroxysmal atrial fibrillation: Secondary | ICD-10-CM

## 2017-05-01 DIAGNOSIS — I2510 Atherosclerotic heart disease of native coronary artery without angina pectoris: Secondary | ICD-10-CM
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
## 2017-05-01 NOTE — Patient Instructions (Signed)
Medication Instructions:  Your provider recommends that you continue on your current medications as directed. Please refer to the Current Medication list given to you today.    Labwork: Your provider recommends that you return for fasting lab work in 6 months prior to your appointment with Dr. Burt Knack.  Testing/Procedures: None  Follow-Up: Your provider wants you to follow-up in: 6 months with Dr. Burt Knack. You will receive a reminder letter in the mail two months in advance. If you don't receive a letter, please call our office to schedule the follow-up appointment.    Any Other Special Instructions Will Be Listed Below (If Applicable).     If you need a refill on your cardiac medications before your next appointment, please call your pharmacy.

## 2017-05-05 ENCOUNTER — Other Ambulatory Visit: Payer: Self-pay | Admitting: Internal Medicine

## 2017-05-17 ENCOUNTER — Other Ambulatory Visit: Payer: Self-pay | Admitting: Internal Medicine

## 2017-05-18 DIAGNOSIS — G894 Chronic pain syndrome: Secondary | ICD-10-CM | POA: Diagnosis not present

## 2017-05-18 DIAGNOSIS — M47816 Spondylosis without myelopathy or radiculopathy, lumbar region: Secondary | ICD-10-CM | POA: Diagnosis not present

## 2017-05-18 DIAGNOSIS — M150 Primary generalized (osteo)arthritis: Secondary | ICD-10-CM | POA: Diagnosis not present

## 2017-05-18 DIAGNOSIS — Z79891 Long term (current) use of opiate analgesic: Secondary | ICD-10-CM | POA: Diagnosis not present

## 2017-05-25 ENCOUNTER — Ambulatory Visit: Payer: Medicare Other | Admitting: Internal Medicine

## 2017-05-25 ENCOUNTER — Encounter: Payer: Self-pay | Admitting: Internal Medicine

## 2017-05-25 VITALS — BP 126/74 | HR 85 | Ht 62.0 in | Wt 204.4 lb

## 2017-05-25 DIAGNOSIS — E1169 Type 2 diabetes mellitus with other specified complication: Secondary | ICD-10-CM

## 2017-05-25 DIAGNOSIS — E785 Hyperlipidemia, unspecified: Secondary | ICD-10-CM

## 2017-05-25 DIAGNOSIS — E1151 Type 2 diabetes mellitus with diabetic peripheral angiopathy without gangrene: Secondary | ICD-10-CM

## 2017-05-25 LAB — HEMOGLOBIN A1C: HEMOGLOBIN A1C: 8 % — AB (ref 4.6–6.5)

## 2017-05-25 MED ORDER — EMPAGLIFLOZIN 25 MG PO TABS: 25.0000 mg | ORAL_TABLET | Freq: Every day | ORAL | 11 refills | Status: DC

## 2017-05-25 NOTE — Patient Instructions (Addendum)
Please continue: - Victoza 1.8 mg in am - Metformin ER 1000 mg 2x a day - Glipizide 10 mg before 2 meals  Please increase: - Jardiance to 25 mg before breakfast  Please read: Dr. Alyssa Grove "The Vegan Starter Kit"  Our goal HbA1c is 7.0%.  Please get the ReliOn Meter from [place].  Please return in  3 months with your sugar log.
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
## 2017-05-26 ENCOUNTER — Other Ambulatory Visit: Payer: Self-pay | Admitting: Internal Medicine

## 2017-05-26 ENCOUNTER — Ambulatory Visit (INDEPENDENT_AMBULATORY_CARE_PROVIDER_SITE_OTHER): Payer: Medicare Other | Admitting: General Practice

## 2017-05-26 DIAGNOSIS — Z7901 Long term (current) use of anticoagulants: Secondary | ICD-10-CM | POA: Diagnosis not present

## 2017-05-26 DIAGNOSIS — I480 Paroxysmal atrial fibrillation: Secondary | ICD-10-CM

## 2017-05-26 LAB — POCT INR: INR: 2.2

## 2017-05-26 NOTE — Patient Instructions (Addendum)
Pre visit review using our clinic review tool, if applicable. No additional management support is needed unless otherwise documented below in the visit note.  Continue to take 1/2 tablet all days except 1 tablet on Monday/Wednesdays and Fridays.  Re-check in 4 weeks 

## 2017-06-01 ENCOUNTER — Other Ambulatory Visit: Payer: Self-pay | Admitting: Family Medicine

## 2017-06-01 ENCOUNTER — Other Ambulatory Visit: Payer: Self-pay | Admitting: Internal Medicine

## 2017-06-04 ENCOUNTER — Other Ambulatory Visit: Payer: Self-pay | Admitting: Cardiovascular Disease

## 2017-06-04 DIAGNOSIS — I480 Paroxysmal atrial fibrillation: Secondary | ICD-10-CM

## 2017-06-07 ENCOUNTER — Other Ambulatory Visit: Payer: Self-pay | Admitting: Internal Medicine

## 2017-06-19 DIAGNOSIS — M150 Primary generalized (osteo)arthritis: Secondary | ICD-10-CM | POA: Diagnosis not present

## 2017-06-19 DIAGNOSIS — Z79891 Long term (current) use of opiate analgesic: Secondary | ICD-10-CM | POA: Diagnosis not present

## 2017-06-19 DIAGNOSIS — M47816 Spondylosis without myelopathy or radiculopathy, lumbar region: Secondary | ICD-10-CM | POA: Diagnosis not present

## 2017-06-19 DIAGNOSIS — G894 Chronic pain syndrome: Secondary | ICD-10-CM | POA: Diagnosis not present

## 2017-06-28 ENCOUNTER — Ambulatory Visit (HOSPITAL_COMMUNITY): Payer: Medicare Other | Admitting: Psychiatry

## 2017-06-28 ENCOUNTER — Encounter (HOSPITAL_COMMUNITY): Payer: Self-pay | Admitting: Psychiatry

## 2017-06-28 VITALS — BP 128/81 | HR 90 | Ht 62.0 in | Wt 204.0 lb

## 2017-06-28 DIAGNOSIS — Z87891 Personal history of nicotine dependence: Secondary | ICD-10-CM

## 2017-06-28 DIAGNOSIS — Z818 Family history of other mental and behavioral disorders: Secondary | ICD-10-CM

## 2017-06-28 DIAGNOSIS — F411 Generalized anxiety disorder: Secondary | ICD-10-CM

## 2017-06-28 MED ORDER — ALPRAZOLAM 1 MG PO TABS: 1.0000 mg | ORAL_TABLET | Freq: Three times a day (TID) | ORAL | 5 refills | Status: DC | PRN
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
## 2017-06-29 ENCOUNTER — Other Ambulatory Visit: Payer: Self-pay | Admitting: Internal Medicine

## 2017-06-30 ENCOUNTER — Ambulatory Visit: Payer: Self-pay

## 2017-07-03 ENCOUNTER — Other Ambulatory Visit: Payer: Self-pay | Admitting: Internal Medicine

## 2017-07-04 ENCOUNTER — Ambulatory Visit (INDEPENDENT_AMBULATORY_CARE_PROVIDER_SITE_OTHER): Payer: Medicare Other | Admitting: General Practice

## 2017-07-04 DIAGNOSIS — I480 Paroxysmal atrial fibrillation: Secondary | ICD-10-CM | POA: Diagnosis not present

## 2017-07-04 DIAGNOSIS — Z7901 Long term (current) use of anticoagulants: Secondary | ICD-10-CM

## 2017-07-04 LAB — POCT INR: INR: 2.6

## 2017-07-04 NOTE — Patient Instructions (Addendum)
Pre visit review using our clinic review tool, if applicable. No additional management support is needed unless otherwise documented below in the visit note.  Continue to take 1/2 tablet all days except 1 tablet on Monday/Wednesdays and Fridays.  Re-check in 6 weeks.

## 2017-07-11 ENCOUNTER — Other Ambulatory Visit: Payer: Self-pay | Admitting: Internal Medicine

## 2017-07-11 DIAGNOSIS — Z1231 Encounter for screening mammogram for malignant neoplasm of breast: Secondary | ICD-10-CM

## 2017-07-14 ENCOUNTER — Encounter: Payer: Self-pay | Admitting: Internal Medicine

## 2017-07-17 ENCOUNTER — Other Ambulatory Visit: Payer: Self-pay

## 2017-07-17 DIAGNOSIS — M47816 Spondylosis without myelopathy or radiculopathy, lumbar region: Secondary | ICD-10-CM | POA: Diagnosis not present

## 2017-07-17 DIAGNOSIS — G894 Chronic pain syndrome: Secondary | ICD-10-CM | POA: Diagnosis not present

## 2017-07-17 DIAGNOSIS — Z79891 Long term (current) use of opiate analgesic: Secondary | ICD-10-CM | POA: Diagnosis not present

## 2017-07-17 DIAGNOSIS — M150 Primary generalized (osteo)arthritis: Secondary | ICD-10-CM | POA: Diagnosis not present

## 2017-07-17 MED ORDER — EMPAGLIFLOZIN 25 MG PO TABS: 25.0000 mg | ORAL_TABLET | Freq: Every day | ORAL | 6 refills | Status: DC

## 2017-07-27 ENCOUNTER — Encounter: Payer: Self-pay | Admitting: Psychology

## 2017-08-02 ENCOUNTER — Ambulatory Visit: Payer: Self-pay

## 2017-08-07 ENCOUNTER — Other Ambulatory Visit (INDEPENDENT_AMBULATORY_CARE_PROVIDER_SITE_OTHER): Payer: Medicare Other

## 2017-08-07 ENCOUNTER — Encounter: Payer: Self-pay | Admitting: Internal Medicine

## 2017-08-07 ENCOUNTER — Ambulatory Visit (INDEPENDENT_AMBULATORY_CARE_PROVIDER_SITE_OTHER): Payer: Medicare Other | Admitting: Internal Medicine

## 2017-08-07 VITALS — BP 124/74 | HR 91 | Temp 98.2°F | Resp 16 | Ht 62.0 in | Wt 208.5 lb

## 2017-08-07 DIAGNOSIS — E039 Hypothyroidism, unspecified: Secondary | ICD-10-CM

## 2017-08-07 DIAGNOSIS — Z9114 Patient's other noncompliance with medication regimen: Secondary | ICD-10-CM

## 2017-08-07 DIAGNOSIS — I10 Essential (primary) hypertension: Secondary | ICD-10-CM | POA: Diagnosis not present

## 2017-08-07 DIAGNOSIS — E1159 Type 2 diabetes mellitus with other circulatory complications: Secondary | ICD-10-CM | POA: Diagnosis not present

## 2017-08-07 DIAGNOSIS — L309 Dermatitis, unspecified: Secondary | ICD-10-CM | POA: Diagnosis not present

## 2017-08-07 DIAGNOSIS — I152 Hypertension secondary to endocrine disorders: Secondary | ICD-10-CM

## 2017-08-07 DIAGNOSIS — E1151 Type 2 diabetes mellitus with diabetic peripheral angiopathy without gangrene: Secondary | ICD-10-CM

## 2017-08-07 LAB — COMPREHENSIVE METABOLIC PANEL
ALT: 11 U/L (ref 0–35)
AST: 11 U/L (ref 0–37)
Albumin: 4.4 g/dL (ref 3.5–5.2)
Alkaline Phosphatase: 66 U/L (ref 39–117)
BUN: 16 mg/dL (ref 6–23)
CHLORIDE: 104 meq/L (ref 96–112)
CO2: 25 meq/L (ref 19–32)
CREATININE: 0.84 mg/dL (ref 0.40–1.20)
Calcium: 9.8 mg/dL (ref 8.4–10.5)
GFR: 86.3 mL/min (ref >60.00)
GLUCOSE: 178 mg/dL — AB (ref 70–99)
Potassium: 4.2 mEq/L (ref 3.5–5.1)
SODIUM: 139 meq/L (ref 135–145)
Total Bilirubin: 0.5 mg/dL (ref 0.2–1.2)
Total Protein: 7.9 g/dL (ref 6.0–8.3)

## 2017-08-07 LAB — HEMOGLOBIN A1C: Hgb A1c MFr Bld: 8.4 % — ABNORMAL HIGH (ref 4.6–6.5)

## 2017-08-07 LAB — TSH: TSH: 1.5 u[IU]/mL (ref 0.35–4.50)

## 2017-08-07 MED ORDER — CLOBETASOL PROPIONATE 0.05 % EX CREA: 1.0000 "application " | TOPICAL_CREAM | Freq: Two times a day (BID) | CUTANEOUS | 2 refills | Status: DC

## 2017-08-07 NOTE — Patient Instructions (Signed)

## 2017-08-07 NOTE — Progress Notes (Signed)
Subjective:  Patient ID: Kristen [name], female    DOB: 10-27-1945  Age: 72 y.o. MRN: [id number]  CC: Hypertension; Hyperlipidemia; Diabetes; and Rash   HPI Kristen [name] presents for f/up - She complains of an itchy rash on the dorsal lateral surface of her right proximal forearm.  She has had this for many years.  She previously saw a dermatologist and was told that she had eczema and UV dermatitis.  This was previously and successfully treated with a topical steroid.  She also complains that her blood sugars have been too high.  She is intermittently noncompliant with some of her meds for diabetes.  She is also not partaking in any lifestyle modifications.  Outpatient Medications Prior to Visit  Medication Sig Dispense Refill  . ALPRAZolam (XANAX) 1 MG tablet Take 1 tablet (1 mg total) by mouth 3 (three) times daily as needed for anxiety. 90 tablet 5  . atorvastatin (LIPITOR) 40 MG tablet TAKE 1 TABLET BY MOUTH DAILY 90 tablet 0  . Blood Glucose Monitoring Suppl (ONE TOUCH ULTRA 2) w/Device KIT Use as advised 1 each 0  . empagliflozin (JARDIANCE) 25 MG TABS tablet Take 25 mg by mouth daily. 30 tablet 6  . furosemide (LASIX) 20 MG tablet TAKE 1 TABLET BY MOUTH DAILY 90 tablet 0  . gabapentin (NEURONTIN) 300 MG capsule TAKE 1 CAPSULE BY MOUTH THREE TIMES DAILY 90 capsule 3  . glipiZIDE (GLUCOTROL) 5 MG tablet TAKE 2 TABLETS BY MOUTH DAILY BEFORE BREAKFAST AND DINNER 120 tablet 2  . Liraglutide (VICTOZA) 18 MG/3ML SOPN Inject 1.8 mg into the skin every morning.     . lubiprostone (AMITIZA) 8 MCG capsule Take 1 capsule (8 mcg total) by mouth 2 (two) times daily with a meal. 30 capsule 11  . MAGNESIUM-OXIDE 400 (241.3 Mg) MG tablet TAKE 1 TABLET BY MOUTH TWICE DAILY 60 tablet 7  . metFORMIN (GLUCOPHAGE-XR) 500 MG 24 hr tablet Take 2 tablets (1,000 mg total) by mouth 2 (two) times daily with a meal. 360 tablet 3  . metoprolol succinate (TOPROL-XL) 100 MG 24 hr tablet TAKE 1 TABLET  BY MOUTH TWICE DAILY 180 tablet 3  . nitroGLYCERIN (NITROSTAT) 0.4 MG SL tablet Place 0.4 mg under the tongue every 5 (five) minutes as needed for chest pain (x 3 doses). Reported on 05/08/2015    . ONE TOUCH ULTRA TEST test strip USE TO TEST BLOOD SUGAR FOUR TIMES DAILY AS DIRECTED 250 each 2  . oxyCODONE-acetaminophen (PERCOCET) 10-325 MG tablet Take 1 tablet by mouth every 4 (four) hours as needed for pain.    . potassium chloride SA (K-DUR,KLOR-CON) 20 MEQ tablet Take 1 tablet (20 mEq total) by mouth 2 (two) times daily. 180 tablet 2  . spironolactone (ALDACTONE) 25 MG tablet TAKE 1 TABLET BY MOUTH TWICE DAILY 180 tablet 0  . telmisartan (MICARDIS) 40 MG tablet TAKE 1 TABLET BY MOUTH EVERY DAY 90 tablet 0  . warfarin (COUMADIN) 6 MG tablet TAKE BY MOUTH AS DIRECTED BY ANTICOAGULATION CLINIC 135 tablet 0  . cyclobenzaprine (FLEXERIL) 10 MG tablet Take 10 mg by mouth every 8 (eight) hours as needed for muscle spasms.    Marland Kitchen glipiZIDE (GLUCOTROL) 5 MG tablet TAKE 2 TABLET BY MOUTH DAILY BEFORE BREAKFAST AND 2 TABLETS BEFORE DINNER 120 tablet 3  . furosemide (LASIX) 20 MG tablet TAKE 1 TABLET BY MOUTH DAILY 90 tablet 0   No facility-administered medications prior to visit.     ROS Review of  Systems  Constitutional: Positive for unexpected weight change (wt gain). Negative for appetite change, diaphoresis and fatigue.  HENT: Negative.   Eyes: Negative for visual disturbance.  Respiratory: Negative for cough, chest tightness, shortness of breath and wheezing.   Cardiovascular: Negative.  Negative for chest pain, palpitations and leg swelling.  Gastrointestinal: Negative for abdominal pain, constipation, diarrhea, nausea and vomiting.  Endocrine: Negative.  Negative for polydipsia, polyphagia and polyuria.  Genitourinary: Negative.  Negative for decreased urine volume, difficulty urinating, dysuria, frequency and urgency.  Musculoskeletal: Negative.  Negative for arthralgias, back pain, myalgias  and neck pain.  Skin: Positive for rash. Negative for color change and pallor.  Neurological: Negative.  Negative for dizziness, weakness, light-headedness, numbness and headaches.  Hematological: Negative for adenopathy. Does not bruise/bleed easily.  Psychiatric/Behavioral: Negative.     Objective:  BP 124/74 (BP Location: Left Arm, Patient Position: Sitting, Cuff Size: Large)   Pulse 91   Temp 98.2 F (36.8 C) (Oral)   Resp 16   Ht '5\' 2"'$  (1.575 m)   Wt 208 lb 8 oz (94.6 kg)   SpO2 96%   BMI 38.14 kg/m   BP Readings from Last 3 Encounters:  08/07/17 124/74  05/25/17 126/74  05/01/17 118/82    Wt Readings from Last 3 Encounters:  08/07/17 208 lb 8 oz (94.6 kg)  05/25/17 204 lb 6.4 oz (92.7 kg)  05/01/17 205 lb 12.8 oz (93.4 kg)    Physical Exam  Constitutional: She is oriented to person, place, and time. No distress.  HENT:  Mouth/Throat: Oropharynx is clear and moist. No oropharyngeal exudate.  Eyes: Conjunctivae are normal. No scleral icterus.  Neck: Normal range of motion. Neck supple. No JVD present. No thyromegaly present.  Cardiovascular: Normal rate, regular rhythm and normal heart sounds. Exam reveals no gallop.  No murmur heard. Pulmonary/Chest: Effort normal and breath sounds normal. She has no wheezes. She has no rales.  Abdominal: Soft. Normal appearance and bowel sounds are normal. She exhibits no mass. There is no hepatosplenomegaly. There is no tenderness.  Musculoskeletal: Normal range of motion. She exhibits no edema, tenderness or deformity.  Lymphadenopathy:    She has no cervical adenopathy.  Neurological: She is oriented to person, place, and time.  Skin: Rash noted. Rash is papular. She is not diaphoretic.  Right forearm, proximal/dorsal lateral aspect there is a faint area of mild hyperpigmentation, xerosis, scale, and papules.  Vitals reviewed.   Lab Results  Component Value Date   WBC 6.6 02/05/2016   HGB 14.7 02/05/2016   HCT 42.9  02/05/2016   PLT 323 02/05/2016   GLUCOSE 178 (H) 08/07/2017   CHOL 102 10/11/2016   TRIG 126 10/11/2016   HDL 35 (L) 10/11/2016   LDLDIRECT 116.0 11/28/2008   LDLCALC 42 10/11/2016   ALT 11 08/07/2017   AST 11 08/07/2017   NA 139 08/07/2017   K 4.2 08/07/2017   CL 104 08/07/2017   CREATININE 0.84 08/07/2017   BUN 16 08/07/2017   CO2 25 08/07/2017   TSH 1.50 08/07/2017   INR 2.6 07/04/2017   HGBA1C 8.4 (H) 08/07/2017   MICROALBUR 0.3 10/13/2016    Mr Brain Wo Contrast  Result Date: 04/24/2017  [facility] [address] [place], [place] [address] [phone] NEUROIMAGING REPORT STUDY DATE: 04/24/2017 PATIENT NAME: Mercede Rollo DOB: Feb 18, 1946 MRN: [id number] EXAM: MRI Brain without contrast ORDERING CLINICIAN: [name], MD, PhD CLINICAL HISTORY: 72 year old woman with memory loss and word finding difficulty  COMPARISON FILMS: none TECHNIQUE: MRI of the brain without contrast was obtained utilizing 5 mm axial slices with T1, T2, T2 flair, SWI and diffusion weighted views.  T1 sagittal and T2 coronal views were obtained. CONTRAST: none IMAGING SITE: [facility], [place], [place] FINDINGS: On sagittal images, the spinal cord is imaged caudally to C3 and is normal in caliber.   The contents of the posterior fossa are of normal size and position.   The pituitary gland is flattened within a normal sized sella turcica consistent with a "partially empty sella turcica". The optic chiasm appear normal.    There is generalized cortical atrophy, most pronounced in the peri-insular region.  The ventricles are not distorted.  There are no abnormal extra-axial collections of fluid.  The cerebellum and brainstem appears normal.   The deep gray matter appears normal.  In the hemispheres, there are multiple T2/FLAIR hyperintense foci in the subcortical and deep white matter. None of these appear to be acute. Diffusion weighted images are normal.   Susceptibility weighted images are normal.  The orbits appear normal.   The VIIth/VIIIth nerve complex appears normal.  The mastoid air cells appear normal.  The paranasal sinuses appear normal.  Flow voids are identified within the major intracerebral arteries.      This MRI of the brain without contrast shows the following: 1.    Mild chronic microvascular ischemic changes 2.    Mild generalized cortical atrophy that is most pronounced in the peri-insular region 3.    There is a partially empty sella turcica. This is usually an incidental finding though can be seen with elevated intracranial hypertension.  4.    There are no acute findings. INTERPRETING PHYSICIAN: Richard A. Felecia Shelling, MD, PhD, FAAN Certified in  Neuroimaging by [employer] [facility] [place]:   Leta was seen today for hypertension, hyperlipidemia, diabetes and rash.  Diagnoses and all orders for this visit:  Acquired hypothyroidism- Her TSH is in the normal range and she is not symptomatic with this.  Thyroid replacement therapy is not indicated. -     TSH; Future  Hypertension associated with diabetes ([place])- Her blood pressure is adequately well controlled.  Electrolytes and renal function are normal. -     Comprehensive metabolic panel; Future  DM (diabetes mellitus), type 2 with peripheral vascular complications ([place])- Her A1c is up to 8.4%.  She admits to noncompliance with some of her meds.  She agrees to be more compliant with her medications and to start working on her lifestyle modifications. -     Hemoglobin A1c; Future -     Comprehensive metabolic panel; Future  Eczema, unspecified type -     Discontinue: clobetasol cream (TEMOVATE) 0.05 %; Apply 1 application topically 2 (two) times daily. -     triamcinolone cream (KENALOG) 0.5 %; Apply 1 application topically 3 (three) times daily.   I have discontinued [name] Dick's cyclobenzaprine and clobetasol cream. I am also having her  start on triamcinolone cream. Additionally, I am having her maintain her liraglutide, nitroGLYCERIN, lubiprostone, oxyCODONE-acetaminophen, ONE TOUCH ULTRA TEST, potassium chloride SA, metFORMIN, ONE TOUCH ULTRA 2, MAGNESIUM-OXIDE, gabapentin, furosemide, glipiZIDE, atorvastatin, metoprolol succinate, spironolactone, ALPRAZolam, telmisartan, warfarin, and empagliflozin.  Meds ordered this encounter  Medications  . DISCONTD: clobetasol cream (TEMOVATE) 0.05 %    Sig: Apply 1 application topically 2 (two) times daily.    Dispense:  45 g    Refill:  2  . triamcinolone  cream (KENALOG) 0.5 %    Sig: Apply 1 application topically 3 (three) times daily.    Dispense:  60 g    Refill:  3     Follow-up: Return in about 4 months (around 12/07/2017).  Scarlette Calico, MD

## 2017-08-08 ENCOUNTER — Encounter: Payer: Self-pay | Admitting: Internal Medicine

## 2017-08-08 ENCOUNTER — Ambulatory Visit: Payer: Self-pay

## 2017-08-08 MED ORDER — TRIAMCINOLONE ACETONIDE 0.5 % EX CREA: 1.0000 "application " | TOPICAL_CREAM | Freq: Three times a day (TID) | CUTANEOUS | 3 refills | Status: DC

## 2017-08-15 ENCOUNTER — Ambulatory Visit: Payer: Self-pay

## 2017-08-15 ENCOUNTER — Ambulatory Visit (INDEPENDENT_AMBULATORY_CARE_PROVIDER_SITE_OTHER): Payer: Medicare Other | Admitting: General Practice

## 2017-08-15 DIAGNOSIS — I480 Paroxysmal atrial fibrillation: Secondary | ICD-10-CM

## 2017-08-15 DIAGNOSIS — Z79891 Long term (current) use of opiate analgesic: Secondary | ICD-10-CM | POA: Diagnosis not present

## 2017-08-15 DIAGNOSIS — M25512 Pain in left shoulder: Secondary | ICD-10-CM | POA: Diagnosis not present

## 2017-08-15 DIAGNOSIS — Z7901 Long term (current) use of anticoagulants: Secondary | ICD-10-CM | POA: Diagnosis not present

## 2017-08-15 DIAGNOSIS — G894 Chronic pain syndrome: Secondary | ICD-10-CM | POA: Diagnosis not present

## 2017-08-15 DIAGNOSIS — M150 Primary generalized (osteo)arthritis: Secondary | ICD-10-CM | POA: Diagnosis not present

## 2017-08-15 LAB — POCT INR: INR: 3.5 — AB (ref 2.0–3.0)

## 2017-08-15 NOTE — Patient Instructions (Addendum)
Pre visit review using our clinic review tool, if applicable. No additional management support is needed unless otherwise documented below in the visit note.  Hold coumadin today (6/11) and then continue to take 1/2 tablet all days except 1 tablet on Monday/Wednesdays and Fridays.  Re-check in 4 weeks.

## 2017-08-16 ENCOUNTER — Other Ambulatory Visit: Payer: Self-pay | Admitting: Anesthesiology

## 2017-08-16 ENCOUNTER — Ambulatory Visit
Admission: RE | Admit: 2017-08-16 | Discharge: 2017-08-16 | Disposition: A | Discharge status: Home / Self Care | Payer: Medicare Other | Source: Ambulatory Visit | Attending: Anesthesiology | Admitting: Anesthesiology

## 2017-08-16 DIAGNOSIS — M19012 Primary osteoarthritis, left shoulder: Secondary | ICD-10-CM | POA: Diagnosis not present

## 2017-08-16 DIAGNOSIS — M25512 Pain in left shoulder: Secondary | ICD-10-CM

## 2017-08-20 ENCOUNTER — Telehealth: Payer: Self-pay | Admitting: Cardiology

## 2017-08-20 NOTE — Telephone Encounter (Signed)
Patient called in reporting that her heart rate, and blood pressure were elevated.  States her heart rate was around 130, and blood pressure was 161/82.  Of note she has history of PAF and has been on metoprolol and Coumadin.  At the time of callback patient reported heart rate had decreased to 80, and blood pressure was in the 130s.  She was not symptomatic.  Instructed that she could take her dose of metoprolol if needed if she has a recurrence of this.  She expressed understanding, and taking her call.  Reino Bellis NP

## 2017-08-25 ENCOUNTER — Other Ambulatory Visit: Payer: Self-pay | Admitting: Internal Medicine

## 2017-08-28 ENCOUNTER — Encounter: Payer: Self-pay | Admitting: Psychology

## 2017-08-29 ENCOUNTER — Ambulatory Visit
Admission: RE | Admit: 2017-08-29 | Discharge: 2017-08-29 | Disposition: A | Discharge status: Home / Self Care | Payer: Medicare Other | Source: Ambulatory Visit | Attending: Internal Medicine | Admitting: Internal Medicine

## 2017-08-29 ENCOUNTER — Other Ambulatory Visit: Payer: Self-pay | Admitting: Cardiovascular Disease

## 2017-08-29 DIAGNOSIS — Z1231 Encounter for screening mammogram for malignant neoplasm of breast: Secondary | ICD-10-CM | POA: Diagnosis not present

## 2017-08-30 LAB — HM MAMMOGRAPHY

## 2017-09-04 ENCOUNTER — Ambulatory Visit: Payer: Self-pay | Admitting: Internal Medicine

## 2017-09-12 ENCOUNTER — Ambulatory Visit (INDEPENDENT_AMBULATORY_CARE_PROVIDER_SITE_OTHER): Payer: Medicare Other | Admitting: General Practice

## 2017-09-12 DIAGNOSIS — G894 Chronic pain syndrome: Secondary | ICD-10-CM | POA: Diagnosis not present

## 2017-09-12 DIAGNOSIS — Z7901 Long term (current) use of anticoagulants: Secondary | ICD-10-CM

## 2017-09-12 DIAGNOSIS — M150 Primary generalized (osteo)arthritis: Secondary | ICD-10-CM | POA: Diagnosis not present

## 2017-09-12 DIAGNOSIS — I480 Paroxysmal atrial fibrillation: Secondary | ICD-10-CM

## 2017-09-12 DIAGNOSIS — Z79891 Long term (current) use of opiate analgesic: Secondary | ICD-10-CM | POA: Diagnosis not present

## 2017-09-12 DIAGNOSIS — M25512 Pain in left shoulder: Secondary | ICD-10-CM | POA: Diagnosis not present

## 2017-09-12 LAB — POCT INR: INR: 2.9 (ref 2.0–3.0)

## 2017-09-12 NOTE — Patient Instructions (Addendum)
Pre visit review using our clinic review tool, if applicable. No additional management support is needed unless otherwise documented below in the visit note.  Continue to take 1/2 tablet all days except 1 tablet on Monday/Wednesdays and Fridays.  Re-check in 4 weeks

## 2017-09-20 ENCOUNTER — Ambulatory Visit: Payer: Medicare Other | Admitting: Nurse Practitioner

## 2017-09-22 ENCOUNTER — Other Ambulatory Visit: Payer: Self-pay | Admitting: Internal Medicine

## 2017-09-25 ENCOUNTER — Other Ambulatory Visit: Payer: Self-pay | Admitting: Anesthesiology

## 2017-09-25 DIAGNOSIS — M25512 Pain in left shoulder: Secondary | ICD-10-CM

## 2017-09-26 ENCOUNTER — Ambulatory Visit: Payer: Self-pay | Admitting: Internal Medicine

## 2017-09-26 ENCOUNTER — Ambulatory Visit: Payer: Self-pay | Admitting: Nurse Practitioner
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
## 2017-09-27 ENCOUNTER — Other Ambulatory Visit: Payer: Self-pay | Admitting: Internal Medicine

## 2017-10-01 ENCOUNTER — Ambulatory Visit
Admission: RE | Admit: 2017-10-01 | Discharge: 2017-10-01 | Disposition: A | Discharge status: Home / Self Care | Payer: Medicare Other | Source: Ambulatory Visit | Attending: Anesthesiology | Admitting: Anesthesiology

## 2017-10-01 DIAGNOSIS — M19012 Primary osteoarthritis, left shoulder: Secondary | ICD-10-CM | POA: Diagnosis not present

## 2017-10-01 DIAGNOSIS — M25512 Pain in left shoulder: Secondary | ICD-10-CM

## 2017-10-10 ENCOUNTER — Ambulatory Visit (INDEPENDENT_AMBULATORY_CARE_PROVIDER_SITE_OTHER): Payer: Medicare Other | Admitting: General Practice

## 2017-10-10 DIAGNOSIS — M150 Primary generalized (osteo)arthritis: Secondary | ICD-10-CM | POA: Diagnosis not present

## 2017-10-10 DIAGNOSIS — Z79891 Long term (current) use of opiate analgesic: Secondary | ICD-10-CM | POA: Diagnosis not present

## 2017-10-10 DIAGNOSIS — Z7901 Long term (current) use of anticoagulants: Secondary | ICD-10-CM

## 2017-10-10 DIAGNOSIS — I480 Paroxysmal atrial fibrillation: Secondary | ICD-10-CM

## 2017-10-10 DIAGNOSIS — G894 Chronic pain syndrome: Secondary | ICD-10-CM | POA: Diagnosis not present

## 2017-10-10 DIAGNOSIS — M25512 Pain in left shoulder: Secondary | ICD-10-CM | POA: Diagnosis not present

## 2017-10-10 LAB — POCT INR: INR: 4.1 — AB (ref 2.0–3.0)

## 2017-10-10 NOTE — Patient Instructions (Addendum)
Pre visit review using our clinic review tool, if applicable. No additional management support is needed unless otherwise documented below in the visit note.  Hold dosage today (8/6) and tomorrow (8/7) and then continue to take 1/2 tablet all days except 1 tablet on Monday/Wednesdays and Fridays.  Re-check in 3 weeks.

## 2017-10-23 ENCOUNTER — Other Ambulatory Visit: Payer: Self-pay | Admitting: Internal Medicine

## 2017-10-30 DIAGNOSIS — E119 Type 2 diabetes mellitus without complications: Secondary | ICD-10-CM | POA: Insufficient documentation

## 2017-10-30 DIAGNOSIS — M19012 Primary osteoarthritis, left shoulder: Secondary | ICD-10-CM | POA: Diagnosis not present

## 2017-10-30 DIAGNOSIS — Z794 Long term (current) use of insulin: Secondary | ICD-10-CM | POA: Insufficient documentation

## 2017-10-30 DIAGNOSIS — I4891 Unspecified atrial fibrillation: Secondary | ICD-10-CM | POA: Insufficient documentation

## 2017-10-31 ENCOUNTER — Ambulatory Visit (INDEPENDENT_AMBULATORY_CARE_PROVIDER_SITE_OTHER): Payer: Medicare Other | Admitting: General Practice

## 2017-10-31 DIAGNOSIS — Z7901 Long term (current) use of anticoagulants: Secondary | ICD-10-CM | POA: Diagnosis not present

## 2017-10-31 DIAGNOSIS — I480 Paroxysmal atrial fibrillation: Secondary | ICD-10-CM

## 2017-10-31 LAB — POCT INR: INR: 3.6 — AB (ref 2.0–3.0)

## 2017-10-31 NOTE — Patient Instructions (Signed)
Pre visit review using our clinic review tool, if applicable. No additional management support is needed unless otherwise documented below in the visit note.  Hold dosage today (8/27) and take 1/2 tablet all days except 1 tablet on Monday/ Fridays.  Re-check in 4 weeks.

## 2017-11-09 DIAGNOSIS — G894 Chronic pain syndrome: Secondary | ICD-10-CM | POA: Diagnosis not present

## 2017-11-09 DIAGNOSIS — Z79891 Long term (current) use of opiate analgesic: Secondary | ICD-10-CM | POA: Diagnosis not present

## 2017-11-09 DIAGNOSIS — M150 Primary generalized (osteo)arthritis: Secondary | ICD-10-CM | POA: Diagnosis not present

## 2017-11-09 DIAGNOSIS — M25512 Pain in left shoulder: Secondary | ICD-10-CM | POA: Diagnosis not present

## 2017-11-14 ENCOUNTER — Other Ambulatory Visit: Payer: Self-pay | Admitting: Internal Medicine

## 2017-11-15 ENCOUNTER — Encounter (HOSPITAL_COMMUNITY): Payer: Self-pay | Admitting: Psychiatry

## 2017-11-15 ENCOUNTER — Ambulatory Visit (HOSPITAL_COMMUNITY): Payer: Medicare Other | Admitting: Psychiatry

## 2017-11-15 VITALS — BP 117/83 | HR 93 | Temp 97.9°F | Ht 62.0 in | Wt 210.0 lb

## 2017-11-15 DIAGNOSIS — Z818 Family history of other mental and behavioral disorders: Secondary | ICD-10-CM

## 2017-11-15 DIAGNOSIS — Z87891 Personal history of nicotine dependence: Secondary | ICD-10-CM

## 2017-11-15 DIAGNOSIS — F411 Generalized anxiety disorder: Secondary | ICD-10-CM

## 2017-11-15 MED ORDER — ALPRAZOLAM 1 MG PO TABS: 1.0000 mg | ORAL_TABLET | Freq: Three times a day (TID) | ORAL | 5 refills | Status: DC | PRN
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
## 2017-11-18 ENCOUNTER — Other Ambulatory Visit: Payer: Self-pay | Admitting: Internal Medicine

## 2017-11-20 NOTE — Progress Notes (Deleted)
Subjective:   Courteny Egler is a 72 y.o. female who presents for Medicare Annual (Subsequent) preventive examination.  Review of Systems:  No ROS.  Medicare Wellness Visit. Additional risk factors are reflected in the social history.    Sleep patterns: {SX; SLEEP PATTERNS:18802::"feels rested on waking","does not get up to void","gets up *** times nightly to void","sleeps *** hours nightly"}.    Home Safety/Smoke Alarms: Feels safe in home. Smoke alarms in place.  Living environment; residence and Firearm Safety: {Rehab home environment / accessibility:30080::"no firearms","firearms stored safely"}. Seat Belt Safety/Bike Helmet: Wears seat belt.    Objective:     Vitals: There were no vitals taken for this visit.  There is no height or weight on file to calculate BMI.  Advanced Directives 10/12/2016 02/05/2016 09/01/2015 11/21/2014 12/07/2012  Does Patient Have a Medical Advance Directive? No No No No Patient does not have advance directive  Would patient like information on creating a medical advance directive? Yes (ED - Information included in AVS) Yes (Inpatient - patient requests chaplain consult to create a medical advance directive) Yes - Educational materials given No - patient declined information -  Pre-existing out of facility DNR order (yellow form or pink MOST form) - - - - No  Some encounter information is confidential and restricted. Go to Review Flowsheets activity to see all data.    Tobacco Social History   Tobacco Use  Smoking Status Former Smoker  . Last attempt to quit: 03/07/2001  . Years since quitting: 16.7  Smokeless Tobacco Never Used     Counseling given: Not Answered  Past Medical History:  Diagnosis Date  . Arthritis   . Blood transfusion without reported diagnosis   . Bursitis   . CAD (coronary artery disease)    a. s/p CABG 2004.b. stable cath 2014 demonstrating stable CAD and continued patency of her LIMA graft.  . Chronic anticoagulation     on coumadin  . Depression   . Diabetes mellitus   . Diastolic dysfunction    per echo in October 2012 with EF 50 to 55%  . Fibromyalgia   . GERD (gastroesophageal reflux disease) 10/23/2003  . Headache(784.0)   . Hyperlipidemia   . Hypertension   . Hypokalemia   . LBBB (left bundle branch block)   . Lumbar back pain   . LV dysfunction    a. EF 45% by cath 2014. b. EF 50-55% by technically difficult echo in 08/2014.  Marland Kitchen Lymphadenitis   . Morbid obesity ([place])    a. Sleep study negative for significant OSA in 11/2014.  Marland Kitchen PAF (paroxysmal atrial fibrillation) ([place])   . Stroke [facility]) 2004   affected speech per pt   Past Surgical History:  Procedure Laterality Date  . ABDOMINAL HYSTERECTOMY    . ANGIOPLASTY  laminectomy  . CHOLECYSTECTOMY    . CORONARY ARTERY BYPASS GRAFT     LIMA to LAD   . KNEE ARTHROSCOPY    . LEFT HEART CATHETERIZATION WITH CORONARY/GRAFT ANGIOGRAM  12/07/2012   Procedure: LEFT HEART CATHETERIZATION WITH Beatrix Fetters;  Surgeon: Blane Ohara, MD;  Location: [facility] CATH LAB;  Service: Cardiovascular;;  . LUMBAR LAMINECTOMY     x3  . SPHINCTEROTOMY    . TONSILLECTOMY     Family History  Problem Relation Age of Onset  . Heart attack Father   . Heart disease Father   . Stroke Mother   . Kidney disease Unknown   . Stroke Unknown   . Arthritis  Unknown   . Hypertension Unknown   . Diabetes Unknown   . Colon cancer Paternal Uncle   . Depression Sister   . Anxiety disorder Maternal Aunt    Social History   Socioeconomic History  . Marital status: Married    Spouse name: Not on file  . Number of children: Not on file  . Years of education: Not on file  . Highest education level: Not on file  Occupational History  . Not on file  Social Needs  . Financial resource strain: Not on file  . Food insecurity:    Worry: Not on file    Inability: Not on file  . Transportation needs:    Medical: Not on file    Non-medical: Not on file  Tobacco  Use  . Smoking status: Former Smoker    Last attempt to quit: 03/07/2001    Years since quitting: 16.7  . Smokeless tobacco: Never Used  Substance and Sexual Activity  . Alcohol use: No  . Drug use: No  . Sexual activity: Not Currently  Lifestyle  . Physical activity:    Days per week: Not on file    Minutes per session: Not on file  . Stress: Not on file  Relationships  . Social connections:    Talks on phone: Not on file    Gets together: Not on file    Attends religious service: Not on file    Active member of club or organization: Not on file    Attends meetings of clubs or organizations: Not on file    Relationship status: Not on file  Other Topics Concern  . Not on file  Social History Narrative   Lives locally, has help available if needed.    Outpatient Encounter Medications as of 11/22/2017  Medication Sig  . ALPRAZolam (XANAX) 1 MG tablet Take 1 tablet (1 mg total) by mouth 3 (three) times daily as needed for anxiety.  Marland Kitchen atorvastatin (LIPITOR) 40 MG tablet TAKE 1 TABLET BY MOUTH DAILY  . Blood Glucose Monitoring Suppl (ONE TOUCH ULTRA 2) w/Device KIT Use as advised  . empagliflozin (JARDIANCE) 25 MG TABS tablet Take 25 mg by mouth daily.  . furosemide (LASIX) 20 MG tablet TAKE 1 TABLET BY MOUTH DAILY  . furosemide (LASIX) 20 MG tablet TAKE 1 TABLET BY MOUTH DAILY  . gabapentin (NEURONTIN) 300 MG capsule TAKE 1 CAPSULE BY MOUTH THREE TIMES DAILY  . glipiZIDE (GLUCOTROL) 5 MG tablet TAKE 2 TABLETS BY MOUTH DAILY BEFORE BREAKFAST AND DINNER AS DIRECTED  . Liraglutide (VICTOZA) 18 MG/3ML SOPN Inject 1.8 mg into the skin every morning.   . lubiprostone (AMITIZA) 8 MCG capsule Take 1 capsule (8 mcg total) by mouth 2 (two) times daily with a meal.  . MAGNESIUM-OXIDE 400 (241.3 Mg) MG tablet TAKE 1 TABLET BY MOUTH TWICE DAILY  . metFORMIN (GLUCOPHAGE-XR) 500 MG 24 hr tablet Take 2 tablets (1,000 mg total) by mouth 2 (two) times daily with a meal.  . metoprolol succinate  (TOPROL-XL) 100 MG 24 hr tablet TAKE 1 TABLET BY MOUTH TWICE DAILY  . nitroGLYCERIN (NITROSTAT) 0.4 MG SL tablet Place 0.4 mg under the tongue every 5 (five) minutes as needed for chest pain (x 3 doses). Reported on 05/08/2015  . ONE TOUCH ULTRA TEST test strip USE TO TEST BLOOD SUGAR FOUR TIMES DAILY AS DIRECTED  . oxyCODONE-acetaminophen (PERCOCET) 10-325 MG tablet Take 1 tablet by mouth every 4 (four) hours as needed for pain.  Marland Kitchen  potassium chloride SA (K-DUR,KLOR-CON) 20 MEQ tablet TAKE 1 TABLET BY MOUTH TWICE DAILY  . spironolactone (ALDACTONE) 25 MG tablet TAKE 1 TABLET BY MOUTH TWICE DAILY  . telmisartan (MICARDIS) 40 MG tablet TAKE 1 TABLET BY MOUTH EVERY DAY  . triamcinolone cream (KENALOG) 0.5 % Apply 1 application topically 3 (three) times daily.  Marland Kitchen warfarin (COUMADIN) 6 MG tablet TAKE BY MOUTH AS DIRECTED BY ANTICOAGULATION CLINIC.   No facility-administered encounter medications on file as of 11/22/2017.     Activities of Daily Living No flowsheet data found.  Patient Care Team: [name], MD as PCP - General (Internal Medicine) Sherren Mocha, MD as PCP - Cardiology (Cardiology) Deboraha Sprang, MD as Consulting Physician (Cardiology) Sherren Mocha, MD as Consulting Physician (Cardiology)    Assessment:   This is a routine wellness examination for Amiylah. Physical assessment deferred to PCP.   Exercise Activities and Dietary recommendations   Diet (meal preparation, eat out, water intake, caffeinated beverages, dairy products, fruits and vegetables): {Desc; diets:16563}  Goals    . lose weight     I will watch carbohydrates and sugars and increase my activity level by doing stretching and chair exercises.       Fall Risk Fall Risk  10/12/2016 09/01/2015 08/09/2013 03/18/2013 12/12/2011  Falls in the past year? No No No No -  Risk for fall due to : Impaired mobility;Impaired balance/gait - - - History of fall(s);Impaired balance/gait    Depression Screen PHQ  2/9 Scores 08/07/2017 10/12/2016 09/01/2015 08/09/2013  PHQ - 2 Score 0 1 0 1  PHQ- 9 Score 2 5 - -     Cognitive Function        Immunization History  Administered Date(s) Administered  . Influenza Split 12/14/2010, 12/12/2011  . Influenza Whole 01/05/2006, 01/29/2007, 12/26/2007, 11/28/2008, 12/04/2009  . Influenza, High Dose Seasonal PF 11/12/2014, 01/26/2016, 11/25/2016  . Influenza,inj,Quad PF,6+ Mos 11/12/2012, 04/08/2014  . Pneumococcal Conjugate-13 08/09/2013  . Pneumococcal Polysaccharide-23 12/04/2009, 12/14/2010, 10/12/2016  . Td 03/07/2005  . Tdap 04/14/2016   Screening Tests Health Maintenance  Topic Date Due  . OPHTHALMOLOGY EXAM  10/25/2017  . INFLUENZA VACCINE  06/06/2018 (Originally 10/05/2017)  . HEMOGLOBIN A1C  02/06/2018  . FOOT EXAM  08/08/2018  . MAMMOGRAM  08/31/2019  . COLONOSCOPY  03/20/2022  . TETANUS/TDAP  04/14/2026  . DEXA SCAN  Completed  . Hepatitis C Screening  Completed  . PNA vac Low Risk Adult  Completed      Plan:     I have personally reviewed and noted the following in the patient's chart:   . Medical and social history . Use of alcohol, tobacco or illicit drugs  . Current medications and supplements . Functional ability and status . Nutritional status . Physical activity . Advanced directives . List of other physicians . Vitals . Screenings to include cognitive, depression, and falls . Referrals and appointments  In addition, I have reviewed and discussed with patient certain preventive protocols, quality metrics, and best practice recommendations. A written personalized care plan for preventive services as well as general preventive health recommendations were provided to patient.     Michiel Cowboy, RN  11/20/2017

## 2017-11-22 ENCOUNTER — Ambulatory Visit: Payer: Self-pay

## 2017-11-23 ENCOUNTER — Other Ambulatory Visit: Payer: Self-pay | Admitting: Cardiovascular Disease

## 2017-11-28 ENCOUNTER — Encounter: Payer: Self-pay | Admitting: Internal Medicine

## 2017-12-04 ENCOUNTER — Encounter

## 2017-12-05 ENCOUNTER — Ambulatory Visit (INDEPENDENT_AMBULATORY_CARE_PROVIDER_SITE_OTHER): Payer: Medicare Other | Admitting: General Practice

## 2017-12-05 DIAGNOSIS — I480 Paroxysmal atrial fibrillation: Secondary | ICD-10-CM

## 2017-12-05 DIAGNOSIS — Z7901 Long term (current) use of anticoagulants: Secondary | ICD-10-CM | POA: Diagnosis not present

## 2017-12-05 LAB — POCT INR: INR: 2.6 (ref 2.0–3.0)

## 2017-12-05 NOTE — Patient Instructions (Addendum)
Pre visit review using our clinic review tool, if applicable. No additional management support is needed unless otherwise documented below in the visit note.  Continue to take 1/2 tablet all days except 1 tablet on Monday/Fridays.  Re-check in 4 weeks. 

## 2017-12-07 DIAGNOSIS — M25512 Pain in left shoulder: Secondary | ICD-10-CM | POA: Diagnosis not present

## 2017-12-07 DIAGNOSIS — Z79891 Long term (current) use of opiate analgesic: Secondary | ICD-10-CM | POA: Diagnosis not present

## 2017-12-07 DIAGNOSIS — G894 Chronic pain syndrome: Secondary | ICD-10-CM | POA: Diagnosis not present

## 2017-12-07 DIAGNOSIS — M150 Primary generalized (osteo)arthritis: Secondary | ICD-10-CM | POA: Diagnosis not present

## 2017-12-17 ENCOUNTER — Other Ambulatory Visit: Payer: Self-pay | Admitting: Internal Medicine

## 2017-12-26 ENCOUNTER — Other Ambulatory Visit: Payer: Medicare Other

## 2017-12-26 DIAGNOSIS — I2510 Atherosclerotic heart disease of native coronary artery without angina pectoris: Secondary | ICD-10-CM | POA: Diagnosis not present

## 2017-12-26 LAB — LIPID PANEL
CHOLESTEROL TOTAL: 108 mg/dL (ref 100–199)
Chol/HDL Ratio: 2.8 ratio (ref 0.0–4.4)
HDL: 38 mg/dL — ABNORMAL LOW (ref >39)
LDL CALC: 48 mg/dL (ref 0–99)
TRIGLYCERIDES: 109 mg/dL (ref 0–149)
VLDL CHOLESTEROL CAL: 22 mg/dL (ref 5–40)

## 2017-12-26 LAB — HEPATIC FUNCTION PANEL
ALT: 10 IU/L (ref 0–32)
AST: 11 IU/L (ref 0–40)
Albumin: 4.4 g/dL (ref 3.6–4.8)
Alkaline Phosphatase: 70 IU/L (ref 39–117)
BILIRUBIN, DIRECT: 0.19 mg/dL (ref 0.00–0.40)
Bilirubin Total: 0.7 mg/dL (ref 0.0–1.2)
Total Protein: 7.6 g/dL (ref 6.0–8.5)

## 2017-12-27 ENCOUNTER — Encounter: Payer: Self-pay | Admitting: Internal Medicine

## 2017-12-27 ENCOUNTER — Ambulatory Visit (INDEPENDENT_AMBULATORY_CARE_PROVIDER_SITE_OTHER): Payer: Medicare Other | Admitting: Internal Medicine

## 2017-12-27 VITALS — BP 110/70 | HR 93 | Temp 98.2°F | Resp 16 | Ht 62.0 in | Wt 208.0 lb

## 2017-12-27 DIAGNOSIS — K5904 Chronic idiopathic constipation: Secondary | ICD-10-CM | POA: Diagnosis not present

## 2017-12-27 DIAGNOSIS — E039 Hypothyroidism, unspecified: Secondary | ICD-10-CM

## 2017-12-27 DIAGNOSIS — E1151 Type 2 diabetes mellitus with diabetic peripheral angiopathy without gangrene: Secondary | ICD-10-CM

## 2017-12-27 DIAGNOSIS — I10 Essential (primary) hypertension: Secondary | ICD-10-CM

## 2017-12-27 DIAGNOSIS — Z0001 Encounter for general adult medical examination with abnormal findings: Secondary | ICD-10-CM | POA: Diagnosis not present

## 2017-12-27 DIAGNOSIS — Z0000 Encounter for general adult medical examination without abnormal findings: Secondary | ICD-10-CM

## 2017-12-27 DIAGNOSIS — Z23 Encounter for immunization: Secondary | ICD-10-CM | POA: Diagnosis not present

## 2017-12-27 DIAGNOSIS — E1159 Type 2 diabetes mellitus with other circulatory complications: Secondary | ICD-10-CM

## 2017-12-27 LAB — POCT GLYCOSYLATED HEMOGLOBIN (HGB A1C): Hemoglobin A1C: 8.4 % — AB (ref 4.0–5.6)

## 2017-12-27 LAB — GLUCOSE, POCT (MANUAL RESULT ENTRY): POC GLUCOSE: 189 mg/dL — AB (ref 70–99)

## 2017-12-27 MED ORDER — PRUCALOPRIDE SUCCINATE 2 MG PO TABS: 1.0000 | ORAL_TABLET | Freq: Every day | ORAL | 1 refills | Status: DC

## 2017-12-27 NOTE — Progress Notes (Signed)
Subjective:  Patient ID: Kristen [name], female    DOB: 08-04-1945  Age: 72 y.o. MRN: [id number]  CC: Annual Exam; Hypertension; and Diabetes   HPI Kristen [name] presents for a CPX.  She continues to complain of constipation and straining.  She has not gotten symptom relief with MiraLAX or Amitiza.  She denies abdominal pain, nausea, vomiting, or blood in her stool.  Her blood sugars have not been well controlled.  She is struggling with a combination of forgetting to take her meds and not being able to afford her meds.  He also complains of polys.  Past Medical History:  Diagnosis Date  . Arthritis   . Blood transfusion without reported diagnosis   . Bursitis   . CAD (coronary artery disease)    a. s/p CABG 2004.b. stable cath 2014 demonstrating stable CAD and continued patency of her LIMA graft.  . Chronic anticoagulation    on coumadin  . Depression   . Diabetes mellitus   . Diastolic dysfunction    per echo in October 2012 with EF 50 to 55%  . Fibromyalgia   . GERD (gastroesophageal reflux disease) 10/23/2003  . Headache(784.0)   . Hyperlipidemia   . Hypertension   . Hypokalemia   . LBBB (left bundle branch block)   . Lumbar back pain   . LV dysfunction    a. EF 45% by cath 2014. b. EF 50-55% by technically difficult echo in 08/2014.  Marland Kitchen Lymphadenitis   . Morbid obesity ([place])    a. Sleep study negative for significant OSA in 11/2014.  Marland Kitchen PAF (paroxysmal atrial fibrillation) ([place])   . Stroke [facility]) 2004   affected speech per pt   Past Surgical History:  Procedure Laterality Date  . ABDOMINAL HYSTERECTOMY    . ANGIOPLASTY  laminectomy  . CHOLECYSTECTOMY    . CORONARY ARTERY BYPASS GRAFT     LIMA to LAD   . KNEE ARTHROSCOPY    . LEFT HEART CATHETERIZATION WITH CORONARY/GRAFT ANGIOGRAM  12/07/2012   Procedure: LEFT HEART CATHETERIZATION WITH Beatrix Fetters;  Surgeon: Blane Ohara, MD;  Location: [facility] CATH LAB;  Service: Cardiovascular;;  .  LUMBAR LAMINECTOMY     x3  . SPHINCTEROTOMY    . TONSILLECTOMY      reports that she quit smoking about 16 years ago. She has never used smokeless tobacco. She reports that she does not drink alcohol or use drugs. family history includes Anxiety disorder in her maternal aunt; Arthritis in her unknown relative; Colon cancer in her paternal uncle; Depression in her sister; Diabetes in her unknown relative; Heart attack in her father; Heart disease in her father; Hypertension in her unknown relative; Kidney disease in her unknown relative; Stroke in her mother and unknown relative. Allergies  Allergen Reactions  . Metformin And Related Other (See Comments)    Must take XR form only, cannot tolerate Regular release metformin  . Cleocin [Clindamycin Hcl] Diarrhea  . Codeine Itching  . Doxycycline Hyclate Diarrhea  . Macrolides And Ketolides Diarrhea  . Morphine Hives  . Pentazocine Lactate Itching and Nausea And Vomiting    (GENERIC- [place])  . Vibramycin [Doxycycline Calcium] Diarrhea and Rash  . Definity [Perflutren Lipid Microsphere] Other (See Comments)    Patient complained of warm sensation in chest x few seconds duration when injected Definity.No other symptoms  . Sulfonamide Derivatives Rash    Outpatient Medications Prior to Visit  Medication Sig Dispense Refill  . ALPRAZolam (XANAX) 1 MG  tablet Take 1 tablet (1 mg total) by mouth 3 (three) times daily as needed for anxiety. 90 tablet 5  . atorvastatin (LIPITOR) 40 MG tablet TAKE 1 TABLET BY MOUTH DAILY 90 tablet 0  . Blood Glucose Monitoring Suppl (ONE TOUCH ULTRA 2) w/Device KIT Use as advised 1 each 0  . empagliflozin (JARDIANCE) 25 MG TABS tablet Take 25 mg by mouth daily. 30 tablet 6  . furosemide (LASIX) 20 MG tablet TAKE 1 TABLET BY MOUTH DAILY 90 tablet 0  . gabapentin (NEURONTIN) 300 MG capsule TAKE 1 CAPSULE BY MOUTH THREE TIMES DAILY 90 capsule 3  . glipiZIDE (GLUCOTROL) 5 MG tablet TAKE 2 TABLETS BY MOUTH DAILY BEFORE  BREAKFAST AND DINNER AS DIRECTED 120 tablet 0  . Liraglutide (VICTOZA) 18 MG/3ML SOPN Inject 1.8 mg into the skin every morning.     Marland Kitchen MAGNESIUM-OXIDE 400 (241.3 Mg) MG tablet TAKE 1 TABLET BY MOUTH TWICE DAILY 60 tablet 5  . metFORMIN (GLUCOPHAGE-XR) 500 MG 24 hr tablet Take 2 tablets (1,000 mg total) by mouth 2 (two) times daily with a meal. 360 tablet 3  . metoprolol succinate (TOPROL-XL) 100 MG 24 hr tablet TAKE 1 TABLET BY MOUTH TWICE DAILY 180 tablet 3  . nitroGLYCERIN (NITROSTAT) 0.4 MG SL tablet Place 0.4 mg under the tongue every 5 (five) minutes as needed for chest pain (x 3 doses). Reported on 05/08/2015    . ONE TOUCH ULTRA TEST test strip USE TO TEST BLOOD SUGAR FOUR TIMES DAILY AS DIRECTED 250 each 1  . oxyCODONE-acetaminophen (PERCOCET) 10-325 MG tablet Take 1 tablet by mouth every 4 (four) hours as needed for pain.    . potassium chloride SA (K-DUR,KLOR-CON) 20 MEQ tablet TAKE 1 TABLET BY MOUTH TWICE DAILY 180 tablet 2  . spironolactone (ALDACTONE) 25 MG tablet TAKE 1 TABLET BY MOUTH TWICE DAILY 180 tablet 0  . telmisartan (MICARDIS) 40 MG tablet TAKE 1 TABLET BY MOUTH EVERY DAY 90 tablet 1  . triamcinolone cream (KENALOG) 0.5 % Apply 1 application topically 3 (three) times daily. 60 g 3  . warfarin (COUMADIN) 6 MG tablet TAKE BY MOUTH AS DIRECTED BY ANTICOAGULATION CLINIC. 135 tablet 0  . lubiprostone (AMITIZA) 8 MCG capsule Take 1 capsule (8 mcg total) by mouth 2 (two) times daily with a meal. 30 capsule 11  . furosemide (LASIX) 20 MG tablet TAKE 1 TABLET BY MOUTH DAILY (Patient not taking: Reported on 12/27/2017) 90 tablet 0   No facility-administered medications prior to visit.     ROS Review of Systems  Constitutional: Negative.  Negative for diaphoresis, fatigue and unexpected weight change.  HENT: Negative.   Eyes: Negative for visual disturbance.  Respiratory: Negative.  Negative for cough, chest tightness, shortness of breath and wheezing.   Cardiovascular: Negative  for chest pain, palpitations and leg swelling.  Gastrointestinal: Positive for constipation. Negative for abdominal pain, blood in stool, diarrhea, nausea and vomiting.  Endocrine: Positive for polydipsia, polyphagia and polyuria.  Genitourinary: Negative.  Negative for difficulty urinating.  Musculoskeletal: Negative.  Negative for arthralgias and myalgias.  Skin: Negative.   Neurological: Negative.  Negative for dizziness, weakness and light-headedness.  Hematological: Negative for adenopathy. Does not bruise/bleed easily.  Psychiatric/Behavioral: Positive for decreased concentration. Negative for confusion, sleep disturbance and suicidal ideas. The patient is not nervous/anxious.     Objective:  BP 110/70 (BP Location: Left Arm, Patient Position: Sitting, Cuff Size: Large)   Pulse 93   Temp 98.2 F (36.8 C) (Oral)  Resp 16   Ht '5\' 2"'$  (1.575 m)   Wt 208 lb (94.3 kg)   SpO2 95%   BMI 38.04 kg/m   BP Readings from Last 3 Encounters:  12/27/17 110/70  08/07/17 124/74  05/25/17 126/74    Wt Readings from Last 3 Encounters:  12/27/17 208 lb (94.3 kg)  08/07/17 208 lb 8 oz (94.6 kg)  05/25/17 204 lb 6.4 oz (92.7 kg)    Physical Exam  Constitutional: She is oriented to person, place, and time. No distress.  HENT:  Mouth/Throat: Oropharynx is clear and moist. No oropharyngeal exudate.  Eyes: Conjunctivae are normal. No scleral icterus.  Neck: Normal range of motion. Neck supple. No tracheal deviation present.  Cardiovascular: Normal rate, regular rhythm and normal heart sounds. Exam reveals no gallop.  No murmur heard. Pulmonary/Chest: Effort normal and breath sounds normal. No respiratory distress. She has no wheezes. She has no rales.  Abdominal: Soft. Bowel sounds are normal. She exhibits no mass. There is no tenderness.  Genitourinary:  Genitourinary Comments: Breast, GU, rectal exams were deferred at her request.  Musculoskeletal: Normal range of motion. She exhibits no  edema, tenderness or deformity.  Lymphadenopathy:    She has no cervical adenopathy.  Neurological: She is alert and oriented to person, place, and time.  Skin: Skin is warm and dry. She is not diaphoretic. No erythema. No pallor.  Vitals reviewed.   Lab Results  Component Value Date   WBC 6.6 02/05/2016   HGB 14.7 02/05/2016   HCT 42.9 02/05/2016   PLT 323 02/05/2016   GLUCOSE 279 (H) 12/28/2017   CHOL 108 12/26/2017   TRIG 109 12/26/2017   HDL 38 (L) 12/26/2017   LDLDIRECT 116.0 11/28/2008   LDLCALC 48 12/26/2017   ALT 10 12/28/2017   AST 8 12/28/2017   NA 137 12/28/2017   K 4.1 12/28/2017   CL 103 12/28/2017   CREATININE 0.97 12/28/2017   BUN 20 12/28/2017   CO2 20 12/28/2017   TSH 1.86 12/28/2017   INR 2.6 12/05/2017   HGBA1C 8.4 (A) 12/27/2017   MICROALBUR <0.7 12/28/2017    Mr Shoulder Left Wo Contrast  Result Date: 10/02/2017 CLINICAL DATA:  Progressive left shoulder pain. Remote history of rotator cuff surgery. EXAM: MRI OF THE LEFT SHOULDER WITHOUT CONTRAST TECHNIQUE: Multiplanar, multisequence MR imaging of the shoulder was performed. No intravenous contrast was administered. COMPARISON:  Radiograph 08/16/2017 FINDINGS: Rotator cuff: Moderate rotator cuff tendinopathy/tendinosis but no recurrent tear is identified. There are interstitial tears but no partial thickness tear. Muscles: Mild diffuse fatty change involving the shoulder musculature. Biceps long head:  Intact Acromioclavicular Joint: Postoperative changes from prior bony decompressive surgery. No findings for bony impingement. Glenohumeral Joint: Moderate to advanced degenerative changes with moderate cartilage loss, joint space narrowing, osteophytic spurring, subchondral cystic change, small joint effusion and mild to moderate synovitis. Labrum:  Labral degenerative changes.  No gross tear. Bones: No acute bony findings. Subchondral cystic change and possible intraosseous ganglion in the humeral head. Other:  Moderate subacromial/subdeltoid bursitis. Benign-appearing lipoma is noted near the axillary recess. IMPRESSION: 1. Surgical changes from prior rotator cuff repair and bony decompressive surgery. No findings for recurrent rotator cuff tear. Moderate supraspinatus tendinopathy/tendinosis. 2. Moderate to advanced glenohumeral joint degenerative changes as detailed above. There is a small joint effusion and mild to moderate synovitis. 3. Intact long head biceps tendon. 4. Advanced labral degenerative changes without obvious tear. 5. Moderate subacromial/subdeltoid bursitis. Electronically Signed   By: Marijo Sanes  M.D.   On: 10/02/2017 08:32    Assessment & Plan:   Bridget was seen today for annual exam, hypertension and diabetes.  Diagnoses and all orders for this visit:  Need for influenza vaccination -     Flu vaccine HIGH DOSE PF  DM (diabetes mellitus), type 2 with peripheral vascular complications ([place])- Her A1c is up to 8.4% and she is symptomatic with hyperglycemia.  She is not willing to use basal insulin but does agree to be more compliant with the 4 medicines that have been prescribed. -     Cancel: Hemoglobin A1c; Future -     Microalbumin / creatinine urine ratio; Future -     POCT glucose (manual entry) -     POCT glycosylated hemoglobin (Hb A1C) -     Ambulatory referral to Ophthalmology  Hypertension associated with diabetes ([place])- Her blood pressure is well controlled.  Electrolytes and renal function are normal. -     Comprehensive metabolic panel; Future -     Urinalysis, Routine w reflex microscopic; Future  Acquired hypothyroidism- Her TSH is in the normal range.  Thyroid replacement therapy is not indicated. -     TSH; Future  Chronic idiopathic constipation -     Prucalopride Succinate (MOTEGRITY) 2 MG TABS; Take 1 tablet by mouth daily.   I have discontinued Areona Homer Dick's lubiprostone. I am also having her start on Prucalopride Succinate. Additionally, I  am having her maintain her liraglutide, nitroGLYCERIN, oxyCODONE-acetaminophen, metFORMIN, ONE TOUCH ULTRA 2, gabapentin, furosemide, metoprolol succinate, spironolactone, empagliflozin, triamcinolone cream, potassium chloride SA, telmisartan, warfarin, atorvastatin, ALPRAZolam, glipiZIDE, MAGNESIUM-OXIDE, and ONE TOUCH ULTRA TEST.  Meds ordered this encounter  Medications  . Prucalopride Succinate (MOTEGRITY) 2 MG TABS    Sig: Take 1 tablet by mouth daily.    Dispense:  90 tablet    Refill:  1   See AVS for instructions about healthy living and anticipatory guidance.  Follow-up: Return in about 4 months (around 04/29/2018).  Scarlette Calico, MD

## 2017-12-27 NOTE — Patient Instructions (Signed)

## 2017-12-28 ENCOUNTER — Other Ambulatory Visit (INDEPENDENT_AMBULATORY_CARE_PROVIDER_SITE_OTHER): Payer: Medicare Other

## 2017-12-28 DIAGNOSIS — I152 Hypertension secondary to endocrine disorders: Secondary | ICD-10-CM

## 2017-12-28 DIAGNOSIS — E039 Hypothyroidism, unspecified: Secondary | ICD-10-CM | POA: Diagnosis not present

## 2017-12-28 DIAGNOSIS — I10 Essential (primary) hypertension: Secondary | ICD-10-CM | POA: Diagnosis not present

## 2017-12-28 DIAGNOSIS — E1151 Type 2 diabetes mellitus with diabetic peripheral angiopathy without gangrene: Secondary | ICD-10-CM

## 2017-12-28 DIAGNOSIS — E1159 Type 2 diabetes mellitus with other circulatory complications: Secondary | ICD-10-CM

## 2017-12-28 LAB — COMPREHENSIVE METABOLIC PANEL
ALBUMIN: 4.4 g/dL (ref 3.5–5.2)
ALK PHOS: 62 U/L (ref 39–117)
ALT: 10 U/L (ref 0–35)
AST: 8 U/L (ref 0–37)
BILIRUBIN TOTAL: 0.8 mg/dL (ref 0.2–1.2)
BUN: 20 mg/dL (ref 6–23)
CALCIUM: 9.5 mg/dL (ref 8.4–10.5)
CO2: 20 mEq/L (ref 19–32)
Chloride: 103 mEq/L (ref 96–112)
Creatinine, Ser: 0.97 mg/dL (ref 0.40–1.20)
GFR: 73.01 mL/min (ref >60.00)
Glucose, Bld: 279 mg/dL — ABNORMAL HIGH (ref 70–99)
POTASSIUM: 4.1 meq/L (ref 3.5–5.1)
Sodium: 137 mEq/L (ref 135–145)
TOTAL PROTEIN: 7.8 g/dL (ref 6.0–8.3)

## 2017-12-28 LAB — URINALYSIS, ROUTINE W REFLEX MICROSCOPIC
Bilirubin Urine: NEGATIVE
HGB URINE DIPSTICK: NEGATIVE
KETONES UR: NEGATIVE
LEUKOCYTES UA: NEGATIVE
Nitrite: NEGATIVE
PH: 5.5 (ref 5.0–8.0)
RBC / HPF: NONE SEEN (ref 0–?)
Specific Gravity, Urine: 1.01 (ref 1.000–1.030)
TOTAL PROTEIN, URINE-UPE24: NEGATIVE
Urine Glucose: >=1000 — AB
Urobilinogen, UA: 0.2 (ref 0.0–1.0)

## 2017-12-28 LAB — MICROALBUMIN / CREATININE URINE RATIO
Creatinine,U: 66.3 mg/dL
Microalb Creat Ratio: 1.1 mg/g (ref 0.0–30.0)
Microalb, Ur: <0.7 mg/dL (ref 0.0–1.9)

## 2017-12-28 LAB — TSH: TSH: 1.86 u[IU]/mL (ref 0.35–4.50)

## 2017-12-29 ENCOUNTER — Encounter: Payer: Self-pay | Admitting: Internal Medicine

## 2017-12-29 NOTE — Assessment & Plan Note (Signed)

## 2018-01-01 ENCOUNTER — Other Ambulatory Visit: Payer: Self-pay

## 2018-01-01 NOTE — Patient Outreach (Signed)
[facility]) Care Management  01/01/2018  Kristen [name] 21-Apr-1945 [id number]   Referral Date: 01/01/18 Referral Source: MD referral Referral Reason: Diabetes Management   Outreach Attempt: No answer.  HIPAA compliant voice message left.     Plan: RN CM will attempt within 4 business days and send a letter.   [name], RN, MSN [facility] Care Management Care Management Coordinator Direct Line [phone] Toll Free: [phone]  Fax: [phone]

## 2018-01-02 ENCOUNTER — Other Ambulatory Visit: Payer: Self-pay

## 2018-01-02 ENCOUNTER — Ambulatory Visit (INDEPENDENT_AMBULATORY_CARE_PROVIDER_SITE_OTHER): Payer: Medicare Other | Admitting: General Practice

## 2018-01-02 DIAGNOSIS — I480 Paroxysmal atrial fibrillation: Secondary | ICD-10-CM

## 2018-01-02 DIAGNOSIS — Z7901 Long term (current) use of anticoagulants: Secondary | ICD-10-CM

## 2018-01-02 LAB — POCT INR: INR: 2.6 (ref 2.0–3.0)

## 2018-01-02 NOTE — Patient Instructions (Addendum)
Pre visit review using our clinic review tool, if applicable. No additional management support is needed unless otherwise documented below in the visit note.  Continue to take 1/2 tablet all days except 1 tablet on Monday/Fridays.  Re-check in 4 weeks. 

## 2018-01-02 NOTE — Patient Outreach (Addendum)
[facility] [facility]) Care Management  01/02/2018  Quandra Fedorchak 01/09/1946 [id number]   Referral Date: 01/02/18 Referral Source: MD referral Referral Reason: Diabetes Management   Outreach Attempt: Spoke with patient.  She is able to verify HIPAA.  Discussed referral.  Patient states she know that her sugar has been going up and appreciates Dr. Ronnald Ramp doing the referral.  Patient states she see Dr. Cruzita Lederer and has an appointment next month.  She will discuss her diabetes and referral on visit.    Patient reports she is independent with all aspects of care.  Patient reports she does check her sugar but not often as she forgets.  Discussed patient A1c of 8.4.  Patient reports that she knows that her diet is not the best and she knows she needs to change what she eats.    Discussed [facility] services and how [facility] can support her.  She is agreeable to health coach for disease management.     Plan: RN CM will refer patient to health coach for disease management.    [name], RN, MSN [facility] Care Management Care Management Coordinator Direct Line [phone] Toll Free: [phone]  Fax: [phone]

## 2018-01-02 NOTE — Patient Outreach (Signed)
[facility]) Care Management  01/02/2018  Kristen [name] 20-Aug-1945 [id number]   Referral Date: 01/01/18 Referral Source: MD referral Referral Reason: Diabetes Management   Outreach Attempt: No answer.  HIPAA compliant voice message left.     Plan: RN CM will attempt within 4 business days.  [name], RN, MSN [place] [facility] Care Management Coordinator Direct Line [phone] Cell [phone] Toll Free: [phone]  Fax: [phone]

## 2018-01-03 ENCOUNTER — Ambulatory Visit: Payer: Self-pay

## 2018-01-04 DIAGNOSIS — G894 Chronic pain syndrome: Secondary | ICD-10-CM | POA: Diagnosis not present

## 2018-01-04 DIAGNOSIS — M150 Primary generalized (osteo)arthritis: Secondary | ICD-10-CM | POA: Diagnosis not present

## 2018-01-04 DIAGNOSIS — Z79891 Long term (current) use of opiate analgesic: Secondary | ICD-10-CM | POA: Diagnosis not present

## 2018-01-04 DIAGNOSIS — M25512 Pain in left shoulder: Secondary | ICD-10-CM | POA: Diagnosis not present

## 2018-01-05 ENCOUNTER — Encounter: Payer: Self-pay | Admitting: Cardiovascular Disease

## 2018-01-05 ENCOUNTER — Ambulatory Visit: Payer: Medicare Other | Admitting: Cardiovascular Disease

## 2018-01-05 VITALS — BP 124/78 | HR 90 | Ht 62.0 in | Wt 209.0 lb

## 2018-01-05 DIAGNOSIS — I2510 Atherosclerotic heart disease of native coronary artery without angina pectoris: Secondary | ICD-10-CM | POA: Diagnosis not present

## 2018-01-05 DIAGNOSIS — I480 Paroxysmal atrial fibrillation: Secondary | ICD-10-CM

## 2018-01-05 DIAGNOSIS — E782 Mixed hyperlipidemia: Secondary | ICD-10-CM | POA: Diagnosis not present

## 2018-01-05 DIAGNOSIS — I10 Essential (primary) hypertension: Secondary | ICD-10-CM | POA: Diagnosis not present

## 2018-01-05 NOTE — Patient Instructions (Signed)

## 2018-01-05 NOTE — Progress Notes (Signed)
Cardiology Office Note:    Date:  01/05/2018   ID:  [name], DOB Apr 26, 1945, MRN [id number]  PCP:  [name], MD  Cardiologist:  Sherren Mocha, MD  Electrophysiologist:  None   Referring MD: [name], MD   Chief Complaint  Patient presents with  . Shortness of Breath   History of Present Illness:    Kristen [name] is a 72 y.o. female with a hx of coronary artery disease status post CABG in 2004.  Other medical problems include paroxysmal atrial fibrillation, history of stroke, obesity, diabetes, hypertension, and left bundle branch block.  The patient is here alone today.  She reports no changes in her cardiac symptoms.  Her biggest problem recently has been bilateral knee pain and weakness.  She is considering knee replacement but has to lose a little bit more weight before she is considered a candidate.  She is only had a single episode of chest discomfort since I have last seen her.  This was associated with her becoming extremely upset and the episode was self-limited.  She is had no other problems with chest pain or pressure.  She has mild exertional dyspnea unchanged over time.  No edema, orthopnea, or PND.  Past Medical History:  Diagnosis Date  . Arthritis   . Blood transfusion without reported diagnosis   . Bursitis   . CAD (coronary artery disease)    a. s/p CABG 2004.b. stable cath 2014 demonstrating stable CAD and continued patency of her LIMA graft.  . Chronic anticoagulation    on coumadin  . Depression   . Diabetes mellitus   . Diastolic dysfunction    per echo in October 2012 with EF 50 to 55%  . Fibromyalgia   . GERD (gastroesophageal reflux disease) 10/23/2003  . Headache(784.0)   . Hyperlipidemia   . Hypertension   . Hypokalemia   . LBBB (left bundle branch block)   . Lumbar back pain   . LV dysfunction    a. EF 45% by cath 2014. b. EF 50-55% by technically difficult echo in 08/2014.  Marland Kitchen Lymphadenitis   . Morbid obesity  ([place])    a. Sleep study negative for significant OSA in 11/2014.  Marland Kitchen PAF (paroxysmal atrial fibrillation) ([place])   . Stroke [facility]) 2004   affected speech per pt    Past Surgical History:  Procedure Laterality Date  . ABDOMINAL HYSTERECTOMY    . ANGIOPLASTY  laminectomy  . CHOLECYSTECTOMY    . CORONARY ARTERY BYPASS GRAFT     LIMA to LAD   . KNEE ARTHROSCOPY    . LEFT HEART CATHETERIZATION WITH CORONARY/GRAFT ANGIOGRAM  12/07/2012   Procedure: LEFT HEART CATHETERIZATION WITH Beatrix Fetters;  Surgeon: Blane Ohara, MD;  Location: [facility] CATH LAB;  Service: Cardiovascular;;  . LUMBAR LAMINECTOMY     x3  . SPHINCTEROTOMY    . TONSILLECTOMY      Current Medications: No outpatient medications have been marked as taking for the 01/05/18 encounter (Office Visit) with Sherren Mocha, MD.     Allergies:   Metformin and related; Cleocin [clindamycin hcl]; Codeine; Doxycycline hyclate; Macrolides and ketolides; Morphine; Pentazocine lactate; Vibramycin [doxycycline calcium]; Definity [perflutren lipid microsphere]; and Sulfonamide derivatives   Social History   Socioeconomic History  . Marital status: Married    Spouse name: Not on file  . Number of children: Not on file  . Years of education: Not on file  . Highest education level: Not on file  Occupational History  . Not on file  Social Needs  . Financial resource strain: Not on file  . Food insecurity:    Worry: Not on file    Inability: Not on file  . Transportation needs:    Medical: Not on file    Non-medical: Not on file  Tobacco Use  . Smoking status: Former Smoker    Last attempt to quit: 03/07/2001    Years since quitting: 16.8  . Smokeless tobacco: Never Used  Substance and Sexual Activity  . Alcohol use: No  . Drug use: No  . Sexual activity: Not Currently  Lifestyle  . Physical activity:    Days per week: Not on file    Minutes per session: Not on file  . Stress: Not on file  Relationships  . Social  connections:    Talks on phone: Not on file    Gets together: Not on file    Attends religious service: Not on file    Active member of club or organization: Not on file    Attends meetings of clubs or organizations: Not on file    Relationship status: Not on file  Other Topics Concern  . Not on file  Social History Narrative   Lives locally, has help available if needed.     Family History: The patient's family history includes Anxiety disorder in her maternal aunt; Arthritis in her unknown relative; Colon cancer in her paternal uncle; Depression in her sister; Diabetes in her unknown relative; Heart attack in her father; Heart disease in her father; Hypertension in her unknown relative; Kidney disease in her unknown relative; Stroke in her mother and unknown relative.  ROS:   Please see the history of present illness.    Positive for visual disturbance, exertional dyspnea, back pain, muscle pain, easy bruising, excessive sweating, leg pain, irregular heartbeats, snoring, constipation, anxiety, balance problems.  All other systems reviewed and are negative.  EKGs/Labs/Other Studies Reviewed:    The following studies were reviewed today: Echocardiogram 09/02/2014: Study Conclusions  - Procedure narrative: Transthoracic echocardiography for left   ventricular function evaluation, for right ventricular function   evaluation, and for assessment of valvular function. Image   quality was suboptimal. Intravenous contrast (Definity) was   administered to enhance regional wall motion assessment and   opacify the LV. - Left ventricle: The cavity size was normal. Wall thickness was   normal. Systolic function was normal. The estimated ejection   fraction was in the range of 50% to 55%. Images were inadequate   for LV wall motion assessment. The study is not technically   sufficient to allow evaluation of LV diastolic function.  Impressions:  - Very technically difficult study. LVEF is  around 50-55%,   inccordinate septal motion, definity contrast was administered,   however, echo windows remained poor.  Myocardial perfusion study 01/07/2015: Study Highlights    The left ventricular ejection fraction is mildly decreased (45-54%).  Nuclear stress EF: 49%.  Defect 1: There is a medium defect of moderate severity present in the apex location. This is most likely to be apical thinning.  This is a low risk study.    EKG:  EKG is not ordered today.   Recent Labs: 12/28/2017: ALT 10; BUN 20; Creatinine, Ser 0.97; Potassium 4.1; Sodium 137; TSH 1.86  Recent Lipid Panel    Component Value Date/Time   CHOL 108 12/26/2017 0758   TRIG 109 12/26/2017 0758   HDL 38 (L) 12/26/2017 4098  CHOLHDL 2.8 12/26/2017 0758   CHOLHDL 2.7 09/01/2015 1344   VLDL 21 09/01/2015 1344   LDLCALC 48 12/26/2017 0758   LDLDIRECT 116.0 11/28/2008 1122    Physical Exam:    VS:  BP 124/78   Pulse 90   Ht 5\' 2"  (1.575 m)   Wt 209 lb (94.8 kg)   SpO2 95%   BMI 38.23 kg/m     Wt Readings from Last 3 Encounters:  01/05/18 209 lb (94.8 kg)  12/27/17 208 lb (94.3 kg)  08/07/17 208 lb 8 oz (94.6 kg)     GEN:  Well nourished, well developed pleasant overweight woman in no acute distress HEENT: Normal NECK: No JVD; No carotid bruits LYMPHATICS: No lymphadenopathy CARDIAC: RRR, no murmurs, rubs, gallops RESPIRATORY:  Clear to auscultation without rales, wheezing or rhonchi  ABDOMEN: Soft, non-tender, non-distended MUSCULOSKELETAL:  No edema; No deformity  SKIN: Warm and dry NEUROLOGIC:  Alert and oriented x 3 PSYCHIATRIC:  Normal affect   ASSESSMENT:    1. Coronary artery disease involving native coronary artery of native heart without angina pectoris   2. Mixed hyperlipidemia   3. PAF (paroxysmal atrial fibrillation) ([place])   4. Essential hypertension    PLAN:    In order of problems listed above:  1. The patient is doing well on her current medical program.  She is  treated with metoprolol succinate 100 mg twice daily. 2. Most recent lipids are reviewed and at goal with LDL cholesterol less than 70.  The patient is treated with atorvastatin 40 mg. 3. The patient is maintaining sinus rhythm.  She is anticoagulated with warfarin. 4. Blood pressure is controlled on a combination of metoprolol succinate, furosemide, spironolactone, and telmisartan.   Medication Adjustments/Labs and Tests Ordered: Current medicines are reviewed at length with the patient today.  Concerns regarding medicines are outlined above.  No orders of the defined types were placed in this encounter.  No orders of the defined types were placed in this encounter.   Patient Instructions  Medication Instructions:  Your provider recommends that you continue on your current medications as directed. Please refer to the Current Medication list given to you today.    Labwork: None  Testing/Procedures: None  Follow-Up: Your provider wants you to follow-up in: 6 months with Dr. Burt Knack. You will receive a reminder letter in the mail two months in advance. If you don't receive a letter, please call our office to schedule the follow-up appointment.    Any Other Special Instructions Will Be Listed Below (If Applicable).     If you need a refill on your cardiac medications before your next appointment, please call your pharmacy.      Signed, Sherren Mocha, MD  01/05/2018 3:17 PM    [place]

## 2018-01-15 ENCOUNTER — Encounter: Payer: Self-pay | Admitting: Internal Medicine

## 2018-01-15 ENCOUNTER — Ambulatory Visit: Payer: Medicare Other | Admitting: Internal Medicine

## 2018-01-15 VITALS — BP 140/92 | HR 94 | Ht 62.0 in | Wt 206.0 lb

## 2018-01-15 DIAGNOSIS — E1169 Type 2 diabetes mellitus with other specified complication: Secondary | ICD-10-CM | POA: Diagnosis not present

## 2018-01-15 DIAGNOSIS — E785 Hyperlipidemia, unspecified: Secondary | ICD-10-CM | POA: Diagnosis not present

## 2018-01-15 DIAGNOSIS — E669 Obesity, unspecified: Secondary | ICD-10-CM | POA: Diagnosis not present

## 2018-01-15 DIAGNOSIS — E1151 Type 2 diabetes mellitus with diabetic peripheral angiopathy without gangrene: Secondary | ICD-10-CM | POA: Diagnosis not present

## 2018-01-15 MED ORDER — GLIPIZIDE 5 MG PO TABS: ORAL_TABLET | ORAL | 3 refills | Status: DC

## 2018-01-15 MED ORDER — LIRAGLUTIDE 18 MG/3ML ~~LOC~~ SOPN: 1.8000 mg | PEN_INJECTOR | Freq: Every morning | SUBCUTANEOUS | 3 refills | Status: DC

## 2018-01-15 MED ORDER — METFORMIN HCL ER 500 MG PO TB24: 1000.0000 mg | ORAL_TABLET | Freq: Two times a day (BID) | ORAL | 3 refills | Status: DC

## 2018-01-15 NOTE — Patient Instructions (Addendum)
Please continue: - Victoza 1.8 mg in am - Metformin ER 1000 mg 2x a day - Glipizide 10 mg before 2 meals - Jardiance 25 mg before breakfast  Please try to write down when you take each of the medicines - try to not miss doses.  Please bring the sugar log at next visit.  Please return in 3-4 months with your sugar log.
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
## 2018-01-17 ENCOUNTER — Other Ambulatory Visit: Payer: Self-pay | Admitting: Internal Medicine

## 2018-01-18 ENCOUNTER — Other Ambulatory Visit: Payer: Self-pay

## 2018-01-18 NOTE — Patient Outreach (Signed)
[facility]) Care Management  01/18/2018  Kristen [name] 11/06/1945 [id number]    Received a call from the patient.  HIPAA verified.  The patient stated that she did not have time to talk with me at the moment but would like for me to call her back.  The role of a Health Coach was explained to her.  Plan: RN Health Coach will call the patient back at a leter time per patient request.  [name] RN, BSN, [place] Direct Dial:  [phone]  Fax: [phone]

## 2018-01-18 NOTE — Patient Outreach (Signed)
[facility] [facility]) Care Management  01/18/2018  Kristen [name] 1945/08/12 [id number]    1st unsuccessful outreach attempt to the patient for initial assessment.  No answer. HIPAA compliant voicemail left with contact information.   Plan: [place] will make outreach attempt the patient within in one month.  [name] RN, BSN, [place] Direct Dial:  [phone]  Fax: [phone]

## 2018-01-30 ENCOUNTER — Ambulatory Visit (INDEPENDENT_AMBULATORY_CARE_PROVIDER_SITE_OTHER): Payer: Medicare Other | Admitting: General Practice

## 2018-01-30 DIAGNOSIS — Z7901 Long term (current) use of anticoagulants: Secondary | ICD-10-CM | POA: Diagnosis not present

## 2018-01-30 DIAGNOSIS — I480 Paroxysmal atrial fibrillation: Secondary | ICD-10-CM

## 2018-01-30 LAB — POCT INR: INR: 3 (ref 2.0–3.0)

## 2018-01-30 NOTE — Patient Instructions (Addendum)
Pre visit review using our clinic review tool, if applicable. No additional management support is needed unless otherwise documented below in the visit note.  Skip dosage today and then continue to take 1/2 tablet all days except 1 tablet on Monday/ Fridays.  Re-check in 4 weeks.

## 2018-02-05 ENCOUNTER — Other Ambulatory Visit: Payer: Self-pay | Admitting: Internal Medicine

## 2018-02-06 DIAGNOSIS — M25512 Pain in left shoulder: Secondary | ICD-10-CM | POA: Diagnosis not present

## 2018-02-06 DIAGNOSIS — Z79891 Long term (current) use of opiate analgesic: Secondary | ICD-10-CM | POA: Diagnosis not present

## 2018-02-06 DIAGNOSIS — M150 Primary generalized (osteo)arthritis: Secondary | ICD-10-CM | POA: Diagnosis not present

## 2018-02-06 DIAGNOSIS — G894 Chronic pain syndrome: Secondary | ICD-10-CM | POA: Diagnosis not present

## 2018-02-16 ENCOUNTER — Other Ambulatory Visit: Payer: Self-pay

## 2018-02-16 NOTE — Patient Outreach (Signed)
[facility]) Care Management  02/16/2018   Kristen [name] [birth year] [id number]      Outreach attempt #1 to the patient for initial assessment. HIPAA verified with patient.  Discussed and offered [facility] care management services with patient. Patient verbally agreed to services   Social:The patient lives in the home with her husband.  She is independent with her ADLS/IADLS. The patient states that she has transportation to her appointments. She denies any falls.  She has pain in her neck, shoulder, lower back and legs.  She rates the pain at a 4/10 now because she has taken medication.  The patient states that she has difficulty walking because she needs two knee replacements.  She has discussed this with her orthopedist and he feels that she needs to lose weight before she can have the surgery The durable medical equipment in the home consist of a cane, rollator, One touch glucometer and scale.   Conditions: Per chart review and speaking with the patient her conditions consist of DM type II, HTN, A-Fib GERD, Hyperlipidemia, Hypothyroidism and Osteoarthritis.  The patient states that she checks her blood sugar twice a day.  Her blood sugar this morning was 114. She states that she does try to monitor her diet but she will eat some things not healthy for her.  She expresses that she wants to be healthy for her grandchildren. She states that she has received her flu shot in October.   Medications:The patient is on seventeen medications.  She is able to manage her medication but states that she will forget to take a dose.  Discussed with the patient about different options to help her to remember to take her meds.  The patient verbalized understanding.  She did not express any concern about paying for medications.  Appointments: The patient has an appointment with her ophthalmology on 03/09/2018.   Advanced Directives: The patient states that she does not have an advanced  directive but would like for me to send her the information.  Current Medications:  Current Outpatient Medications  Medication Sig Dispense Refill  . ALPRAZolam (XANAX) 1 MG tablet Take 1 tablet (1 mg total) by mouth 3 (three) times daily as needed for anxiety. 90 tablet 5  . atorvastatin (LIPITOR) 40 MG tablet TAKE 1 TABLET BY MOUTH DAILY 90 tablet 1  . Blood Glucose Monitoring Suppl (ONE TOUCH ULTRA 2) w/Device KIT Use as advised 1 each 0  . empagliflozin (JARDIANCE) 25 MG TABS tablet Take 25 mg by mouth daily. 30 tablet 6  . furosemide (LASIX) 20 MG tablet TAKE 1 TABLET BY MOUTH DAILY 90 tablet 0  . gabapentin (NEURONTIN) 300 MG capsule TAKE ONE CAPSULE BY MOUTH THREE TIMES DAILY 90 capsule 5  . glipiZIDE (GLUCOTROL) 5 MG tablet Take 2 tablets 2x a day before meals 120 tablet 3  . liraglutide (VICTOZA) 18 MG/3ML SOPN Inject 0.3 mLs (1.8 mg total) into the skin every morning. 27 mL 3  . MAGNESIUM-OXIDE 400 (241.3 Mg) MG tablet TAKE 1 TABLET BY MOUTH TWICE DAILY 60 tablet 5  . metFORMIN (GLUCOPHAGE-XR) 500 MG 24 hr tablet Take 2 tablets (1,000 mg total) by mouth 2 (two) times daily with a meal. 360 tablet 3  . metoprolol succinate (TOPROL-XL) 100 MG 24 hr tablet TAKE 1 TABLET BY MOUTH TWICE DAILY 180 tablet 3  . nitroGLYCERIN (NITROSTAT) 0.4 MG SL tablet Place 0.4 mg under the tongue every 5 (five) minutes as needed for chest pain (x 3 doses).  Reported on 05/08/2015    . ONE TOUCH ULTRA TEST test strip USE TO TEST BLOOD SUGAR FOUR TIMES DAILY AS DIRECTED 250 each 1  . oxyCODONE-acetaminophen (PERCOCET) 10-325 MG tablet Take 1 tablet by mouth every 4 (four) hours as needed for pain.    . potassium chloride SA (K-DUR,KLOR-CON) 20 MEQ tablet TAKE 1 TABLET BY MOUTH TWICE DAILY 180 tablet 2  . Prucalopride Succinate (MOTEGRITY) 2 MG TABS Take 1 tablet by mouth daily. 90 tablet 1  . spironolactone (ALDACTONE) 25 MG tablet TAKE 1 TABLET BY MOUTH TWICE DAILY 180 tablet 0  . telmisartan (MICARDIS) 40 MG  tablet TAKE 1 TABLET BY MOUTH EVERY DAY 90 tablet 1  . triamcinolone cream (KENALOG) 0.5 % Apply 1 application topically 3 (three) times daily. 60 g 3  . warfarin (COUMADIN) 6 MG tablet TAKE BY MOUTH AS DIRECTED BY ANTICOAGULATION CLINIC. 135 tablet 0   No current facility-administered medications for this visit.     Functional Status:  In your present state of health, do you have any difficulty performing the following activities: 02/16/2018 12/29/2017  Hearing? N N  Vision? N N  Difficulty concentrating or making decisions? N Y  Walking or climbing stairs? Y N  Dressing or bathing? N N  Doing errands, shopping? Y Y  Some recent data might be hidden    Fall/Depression Screening: Fall Risk  02/16/2018 12/29/2017 12/27/2017  Falls in the past year? 0 No No  Risk for fall due to : - - -   PHQ 2/9 Scores 02/16/2018 01/02/2018 12/29/2017 08/07/2017 10/12/2016 09/01/2015 08/09/2013  PHQ - 2 Score 1 0 0 0 1 0 1  PHQ- 9 Score - - - 2 5 - -    Assessment: Patient will benefit from health coach outreach for disease management and support.   THN CM Care Plan Problem One     Most Recent Value  Care Plan Problem One  Knowledge deficit related to diabetes  Role Documenting the Problem One  [place] for Problem One  Active  THN Long Term Goal   In 30 days the patient will verbalize that her a1c has lowered 1-2 points  [facility] Long Term Goal Start Date  02/16/18  Interventions for Problem One Long Term Goal     Reviewed cbg record,  discussed food and fluid  intake, and discussed medication management       Plan: [place] will contact patient in the month of January and patient agrees to next outreach.  [name] RN, BSN, [place] Direct Dial:  [phone]  Fax: [phone]

## 2018-02-18 ENCOUNTER — Other Ambulatory Visit: Payer: Self-pay | Admitting: Internal Medicine

## 2018-02-21 ENCOUNTER — Ambulatory Visit (HOSPITAL_COMMUNITY): Payer: Medicare Other | Admitting: Psychiatry

## 2018-02-21 ENCOUNTER — Encounter (HOSPITAL_COMMUNITY): Payer: Self-pay | Admitting: Psychiatry

## 2018-02-21 VITALS — BP 128/72 | Ht 60.5 in | Wt 207.0 lb

## 2018-02-21 DIAGNOSIS — F411 Generalized anxiety disorder: Secondary | ICD-10-CM | POA: Diagnosis not present

## 2018-02-21 MED ORDER — ALPRAZOLAM 1 MG PO TABS: 1.0000 mg | ORAL_TABLET | Freq: Three times a day (TID) | ORAL | 5 refills | Status: DC | PRN

## 2018-02-21 NOTE — Progress Notes (Signed)
Patient ID: Kristen [name], female   DOB: 10-Jan-1946, 72 y.o.   MRN: [id number] [facility] MD Progress Note  02/21/2018 2:56 PM Kristen [name]  MRN:  [id number] Subjective:  Scared Principal Problem: Adjustment disorder with anxious mood state   Today the patient is doing fairly well.  She seems slower than usual.  Her speech is slow but she is rational and clear thinking.  She is had no falls.  She describes Xanax is having a calming effect.  She denies daily depression.  Financially she is doing okay.  She rents her home.  The patient enjoys going to church.  She is sleeping at her baseline.  She is eating well.  She is trying to lose weight but has difficult times doing.  She has 4 grandchildren.  Christmas times are hard because remembers her grandchild died about 10 years ago.  Patient has chronic pain.  She has pain in her shoulders and both knees.  Surgeons have implied that she needs to be them to be replaced.  The patient has multiple medical illnesses including coronary artery disease and fibromyalgia.  She is married to her husband who has PTSD and refuses to come into this room to be interviewed by me.  The patient's weight is approximately 207 pounds.  It should be noted that in the past efforts to reduce her Xanax really been disastrous.  Even attempts to change her to Xanax is been very hard.  At this Time given that she is been on it for decades I will continue prescribing Xanax as is.  She denies use of any alcohol or drugs.  She always uses her Xanax completely appropriately.  There is some evidence that she may have some degree of generalized anxiety disorder.  Nonetheless the patient is actually very stable. Past Medical History:  Past Medical History:  Diagnosis Date  . Arthritis   . Blood transfusion without reported diagnosis   . Bursitis   . CAD (coronary artery disease)    a. s/p CABG 2004.b. stable cath 2014 demonstrating stable CAD and continued patency of her LIMA  graft.  . Chronic anticoagulation    on coumadin  . DDD (degenerative disc disease), lumbar   . Depression   . Diabetes mellitus   . Diastolic dysfunction    per echo in October 2012 with EF 50 to 55%  . Fibromyalgia   . GERD (gastroesophageal reflux disease) 10/23/2003  . Headache(784.0)   . Hyperlipidemia   . Hypertension   . Hypokalemia   . LBBB (left bundle branch block)   . Lumbar back pain   . LV dysfunction    a. EF 45% by cath 2014. b. EF 50-55% by technically difficult echo in 08/2014.  Marland Kitchen Lymphadenitis   . Morbid obesity ([place])    a. Sleep study negative for significant OSA in 11/2014.  Marland Kitchen PAF (paroxysmal atrial fibrillation) ([place])   . Stroke [facility]) 2004   affected speech per pt    Past Surgical History:  Procedure Laterality Date  . ABDOMINAL HYSTERECTOMY    . ANGIOPLASTY  laminectomy  . CHOLECYSTECTOMY    . CORONARY ARTERY BYPASS GRAFT     LIMA to LAD   . KNEE ARTHROSCOPY    . LEFT HEART CATHETERIZATION WITH CORONARY/GRAFT ANGIOGRAM  12/07/2012   Procedure: LEFT HEART CATHETERIZATION WITH Kristen [name];  Surgeon: Blane Ohara, MD;  Location: [facility] CATH LAB;  Service: Cardiovascular;;  . LUMBAR LAMINECTOMY     x3  . SPHINCTEROTOMY    .  TONSILLECTOMY     Family History:  Family History  Problem Relation Age of Onset  . Heart attack Father   . Heart disease Father   . Stroke Mother   . Kidney disease Other   . Stroke Other   . Arthritis Other   . Hypertension Other   . Diabetes Other   . Colon cancer Paternal Uncle   . Depression Sister   . Anxiety disorder Maternal Aunt    Family Psychiatric  History:  Social History:  Social History   Substance and Sexual Activity  Alcohol Use No     Social History   Substance and Sexual Activity  Drug Use No    Social History   Socioeconomic History  . Marital status: Married    Spouse name: Not on file  . Number of children: Not on file  . Years of education: Not on file  . Highest education  level: Not on file  Occupational History  . Not on file  Social Needs  . Financial resource strain: Not on file  . Food insecurity:    Worry: Not on file    Inability: Not on file  . Transportation needs:    Medical: Not on file    Non-medical: Not on file  Tobacco Use  . Smoking status: Former Smoker    Last attempt to quit: 03/07/2001    Years since quitting: 16.9  . Smokeless tobacco: Never Used  Substance and Sexual Activity  . Alcohol use: No  . Drug use: No  . Sexual activity: Not Currently  Lifestyle  . Physical activity:    Days per week: Not on file    Minutes per session: Not on file  . Stress: Not on file  Relationships  . Social connections:    Talks on phone: Not on file    Gets together: Not on file    Attends religious service: Not on file    Active member of club or organization: Not on file    Attends meetings of clubs or organizations: Not on file    Relationship status: Not on file  Other Topics Concern  . Not on file  Social History Narrative   Lives locally, has help available if needed.   Additional Social History:                         Sleep: Fair  Appetite:  Fair  Current Medications: Current Outpatient Medications  Medication Sig Dispense Refill  . ALPRAZolam (XANAX) 1 MG tablet Take 1 tablet (1 mg total) by mouth 3 (three) times daily as needed for anxiety. 90 tablet 5  . atorvastatin (LIPITOR) 40 MG tablet TAKE 1 TABLET BY MOUTH DAILY 90 tablet 1  . Blood Glucose Monitoring Suppl (ONE TOUCH ULTRA 2) w/Device KIT Use as advised 1 each 0  . empagliflozin (JARDIANCE) 25 MG TABS tablet Take 25 mg by mouth daily. 30 tablet 6  . furosemide (LASIX) 20 MG tablet TAKE 1 TABLET BY MOUTH DAILY 90 tablet 0  . gabapentin (NEURONTIN) 300 MG capsule TAKE ONE CAPSULE BY MOUTH THREE TIMES DAILY 90 capsule 5  . glipiZIDE (GLUCOTROL) 5 MG tablet Take 2 tablets 2x a day before meals 120 tablet 3  . liraglutide (VICTOZA) 18 MG/3ML SOPN Inject 0.3  mLs (1.8 mg total) into the skin every morning. 27 mL 3  . MAGNESIUM-OXIDE 400 (241.3 Mg) MG tablet TAKE 1 TABLET BY MOUTH TWICE DAILY 60 tablet 5  .  metFORMIN (GLUCOPHAGE-XR) 500 MG 24 hr tablet Take 2 tablets (1,000 mg total) by mouth 2 (two) times daily with a meal. 360 tablet 3  . metoprolol succinate (TOPROL-XL) 100 MG 24 hr tablet TAKE 1 TABLET BY MOUTH TWICE DAILY 180 tablet 3  . nitroGLYCERIN (NITROSTAT) 0.4 MG SL tablet Place 0.4 mg under the tongue every 5 (five) minutes as needed for chest pain (x 3 doses). Reported on 05/08/2015    . ONE TOUCH ULTRA TEST test strip USE TO TEST BLOOD SUGAR FOUR TIMES DAILY AS DIRECTED 250 each 1  . oxyCODONE-acetaminophen (PERCOCET) 10-325 MG tablet Take 1 tablet by mouth every 4 (four) hours as needed for pain.    . potassium chloride SA (K-DUR,KLOR-CON) 20 MEQ tablet TAKE 1 TABLET BY MOUTH TWICE DAILY 180 tablet 2  . Prucalopride Succinate (MOTEGRITY) 2 MG TABS Take 1 tablet by mouth daily. 90 tablet 1  . spironolactone (ALDACTONE) 25 MG tablet TAKE 1 TABLET BY MOUTH TWICE DAILY 180 tablet 0  . telmisartan (MICARDIS) 40 MG tablet TAKE 1 TABLET BY MOUTH EVERY DAY 90 tablet 1  . triamcinolone cream (KENALOG) 0.5 % Apply 1 application topically 3 (three) times daily. 60 g 3  . warfarin (COUMADIN) 6 MG tablet TAKE BY MOUTH AS DIRECTED BY ANTICOAGULATION CLINIC. 135 tablet 0   No current facility-administered medications for this visit.     Lab Results:  No results found. However, due to the size of the patient record, not all encounters were searched. Please check Results Review for a complete set of results.  Physical Findings: AIMS:  , ,  ,  ,    CIWA:    COWS:     Musculoskeletal: Strength & Muscle Tone: within normal limits Gait & Station: normal Patient leans: Right  Psychiatric Specialty Exam: ROS  Blood pressure 128/72, height 5' 0.5" (1.537 m), weight 207 lb (93.9 kg).Body mass index is 39.76 kg/m.  General Appearance: NA  Eye  Contact::  Good  Speech:  Clear and Coherent  Volume:  Normal  Mood:  NA  Affect:  Congruent  Thought Process:  Coherent  Orientation:  Full (Time, Place, and Person)  Thought Content:  WDL  Suicidal Thoughts:  No  Homicidal Thoughts:  No  Memory:  NA  Judgement:  Good  Insight:  Good  Psychomotor Activity:  Normal  Concentration:  Good  Recall:  Good  Fund of Knowledge:Fair  Language: Good  Akathisia:  No  Handed:  Left  AIMS (if indicated):     Assets:  Desire for Improvement  ADL's:  Intact  Cognition: WNL  Sleep:      Treatment Plan Summary:   The patient is #1 problem is probably that of generalized anxiety disorder.  One could also characterize it is an adjustment disorder with an anxious mood state where the precipitant is chronic pain and episodes of severe pain.  At this time she takes Xanax 1 mg 3 times daily and does well.  She denies use of alcohol or drugs.  She denies chest pain.  She is chronic joint pain.  Ideally the patient would like to have surgery for from her shoulder but unfortunately she simply is too overweight.  The patient is not depressed.  She is functioning actually quite well.  She looks forward to the holidays.

## 2018-03-13 ENCOUNTER — Ambulatory Visit (INDEPENDENT_AMBULATORY_CARE_PROVIDER_SITE_OTHER): Payer: Medicare Other | Admitting: General Practice

## 2018-03-13 DIAGNOSIS — Z7901 Long term (current) use of anticoagulants: Secondary | ICD-10-CM | POA: Diagnosis not present

## 2018-03-13 DIAGNOSIS — I480 Paroxysmal atrial fibrillation: Secondary | ICD-10-CM

## 2018-03-13 LAB — POCT INR: INR: 1.4 — AB (ref 2.0–3.0)

## 2018-03-13 NOTE — Patient Instructions (Addendum)
Pre visit review using our clinic review tool, if applicable. No additional management support is needed unless otherwise documented below in the visit note.  Take 1 tablet today and tomorrow (1/7 and 1/8) and then continue to take 1/2 tablet all days except 1 tablet on Monday/ Fridays.  Re-check in 2 weeks.

## 2018-03-15 DIAGNOSIS — Z79891 Long term (current) use of opiate analgesic: Secondary | ICD-10-CM | POA: Diagnosis not present

## 2018-03-15 DIAGNOSIS — M25512 Pain in left shoulder: Secondary | ICD-10-CM | POA: Diagnosis not present

## 2018-03-15 DIAGNOSIS — M150 Primary generalized (osteo)arthritis: Secondary | ICD-10-CM | POA: Diagnosis not present

## 2018-03-15 DIAGNOSIS — G894 Chronic pain syndrome: Secondary | ICD-10-CM | POA: Diagnosis not present

## 2018-03-26 ENCOUNTER — Other Ambulatory Visit: Payer: Self-pay | Admitting: Internal Medicine

## 2018-03-27 ENCOUNTER — Ambulatory Visit (INDEPENDENT_AMBULATORY_CARE_PROVIDER_SITE_OTHER): Payer: Medicare Other | Admitting: General Practice

## 2018-03-27 DIAGNOSIS — I480 Paroxysmal atrial fibrillation: Secondary | ICD-10-CM

## 2018-03-27 DIAGNOSIS — Z7901 Long term (current) use of anticoagulants: Secondary | ICD-10-CM | POA: Diagnosis not present

## 2018-03-27 LAB — POCT INR: INR: 1.7 — AB (ref 2.0–3.0)

## 2018-03-27 NOTE — Patient Instructions (Addendum)
Pre visit review using our clinic review tool, if applicable. No additional management support is needed unless otherwise documented below in the visit note.  Take 1 tablet today (1/21) and then change dosage and take 1/2 tablet all days except 1 tablet on Monday/Wed/ Fridays.  Re-check in 3 weeks.

## 2018-04-02 ENCOUNTER — Other Ambulatory Visit: Payer: Self-pay

## 2018-04-02 NOTE — Patient Outreach (Signed)
[facility]) Care Management  04/02/2018  Kristen [name] May 12, 1945 [id number]    1st unsuccessful attempt to the patient for assessment.  No answer.  HIPAA compliant voicemail left with contact information.  Plan: RN Health Coach will send letter. [place] will make outreach attempt the patient within thirty days.  [name] RN, BSN, [place] Direct Dial:  [phone]  Fax: [phone]

## 2018-04-13 DIAGNOSIS — Z79891 Long term (current) use of opiate analgesic: Secondary | ICD-10-CM | POA: Diagnosis not present

## 2018-04-13 DIAGNOSIS — M25512 Pain in left shoulder: Secondary | ICD-10-CM | POA: Diagnosis not present

## 2018-04-13 DIAGNOSIS — M25511 Pain in right shoulder: Secondary | ICD-10-CM | POA: Diagnosis not present

## 2018-04-13 DIAGNOSIS — G894 Chronic pain syndrome: Secondary | ICD-10-CM | POA: Diagnosis not present

## 2018-04-17 ENCOUNTER — Ambulatory Visit: Payer: Self-pay

## 2018-04-20 ENCOUNTER — Ambulatory Visit (INDEPENDENT_AMBULATORY_CARE_PROVIDER_SITE_OTHER): Payer: Medicare Other | Admitting: General Practice

## 2018-04-20 DIAGNOSIS — Z7901 Long term (current) use of anticoagulants: Secondary | ICD-10-CM

## 2018-04-20 DIAGNOSIS — I480 Paroxysmal atrial fibrillation: Secondary | ICD-10-CM

## 2018-04-20 LAB — POCT INR: INR: 4.4 — AB (ref 2.0–3.0)

## 2018-04-20 NOTE — Patient Instructions (Addendum)
Pre visit review using our clinic review tool, if applicable. No additional management support is needed unless otherwise documented below in the visit note.  Skip coumadin today and tomorrow (2/14 and 2/15).  On Sunday change dosage and take 1/2 tablet all days except 1 tablet on Monday/Fridays.  Re-check in 3 weeks.

## 2018-04-23 ENCOUNTER — Encounter: Payer: Self-pay | Admitting: Nurse Practitioner

## 2018-04-23 ENCOUNTER — Other Ambulatory Visit: Payer: Self-pay | Admitting: Nurse Practitioner

## 2018-04-23 ENCOUNTER — Ambulatory Visit (INDEPENDENT_AMBULATORY_CARE_PROVIDER_SITE_OTHER): Payer: Medicare Other | Admitting: Nurse Practitioner

## 2018-04-23 VITALS — BP 110/80 | HR 87 | Temp 97.8°F | Ht 60.5 in | Wt 207.0 lb

## 2018-04-23 DIAGNOSIS — J069 Acute upper respiratory infection, unspecified: Secondary | ICD-10-CM

## 2018-04-23 MED ORDER — FLUTICASONE PROPIONATE 50 MCG/ACT NA SUSP: 2.0000 | Freq: Every day | NASAL | 1 refills | Status: DC

## 2018-04-23 MED ORDER — LORATADINE 10 MG PO TABS: 10.0000 mg | ORAL_TABLET | Freq: Every day | ORAL | 1 refills | Status: DC

## 2018-04-23 NOTE — Progress Notes (Signed)
Kristen [name] is a 73 y.o. female with the following history as recorded in Epic[employer]:  Patient Active Problem List   Diagnosis Date Noted  . Obesity with body mass index (BMI) of 30.0 to 39.9 01/15/2018  . Chronic idiopathic constipation 12/27/2017  . Eczema 08/07/2017  . Insomnia secondary to chronic pain 08/04/2016  . Visit for screening mammogram 04/14/2016  . Snoring 11/26/2014  . LV dysfunction   . Routine general medical examination at a health care facility 05/03/2013  . DM (diabetes mellitus), type 2 with peripheral vascular complications ([place]) 62/70/3500  . PVC's/nonsustained VT 10/24/2012  . H/O: CVA (cerebrovascular accident) 02/09/2012  . Hypothyroidism 05/27/2011  . Fibromyalgia 10/08/2010  . Long term current use of anticoagulant 05/05/2010  . Low back pain, non-specific 05/12/2008  . Cognitive dysfunction associated with depression 01/29/2007  . Osteoarthritis 11/22/2006  . Hyperlipidemia associated with type 2 diabetes mellitus ([place]) 09/13/2006  . Coronary atherosclerosis 09/13/2006  . Paroxysmal atrial fibrillation ([place]) 09/13/2006  . GERD 09/13/2006  . Hypertension associated with diabetes ([place]) 08/30/2006    Current Outpatient Medications  Medication Sig Dispense Refill  . ALPRAZolam (XANAX) 1 MG tablet Take 1 tablet (1 mg total) by mouth 3 (three) times daily as needed for anxiety. 90 tablet 5  . atorvastatin (LIPITOR) 40 MG tablet TAKE 1 TABLET BY MOUTH DAILY 90 tablet 1  . Blood Glucose Monitoring Suppl (ONE TOUCH ULTRA 2) w/Device KIT Use as advised 1 each 0  . empagliflozin (JARDIANCE) 25 MG TABS tablet Take 25 mg by mouth daily. 30 tablet 6  . furosemide (LASIX) 20 MG tablet TAKE 1 TABLET BY MOUTH DAILY 90 tablet 0  . gabapentin (NEURONTIN) 300 MG capsule TAKE ONE CAPSULE BY MOUTH THREE TIMES DAILY 90 capsule 5  . glipiZIDE (GLUCOTROL) 5 MG tablet Take 2 tablets 2x a day before meals 120 tablet 3  . liraglutide (VICTOZA) 18 MG/3ML SOPN Inject 0.3  mLs (1.8 mg total) into the skin every morning. 27 mL 3  . MAGNESIUM-OXIDE 400 (241.3 Mg) MG tablet TAKE 1 TABLET BY MOUTH TWICE DAILY 60 tablet 5  . metFORMIN (GLUCOPHAGE-XR) 500 MG 24 hr tablet Take 2 tablets (1,000 mg total) by mouth 2 (two) times daily with a meal. 360 tablet 3  . metoprolol succinate (TOPROL-XL) 100 MG 24 hr tablet TAKE 1 TABLET BY MOUTH TWICE DAILY 180 tablet 3  . nitroGLYCERIN (NITROSTAT) 0.4 MG SL tablet Place 0.4 mg under the tongue every 5 (five) minutes as needed for chest pain (x 3 doses). Reported on 05/08/2015    . ONE TOUCH ULTRA TEST test strip USE TO TEST BLOOD SUGAR FOUR TIMES DAILY AS DIRECTED 250 each 1  . oxyCODONE-acetaminophen (PERCOCET) 10-325 MG tablet Take 1 tablet by mouth every 4 (four) hours as needed for pain.    . potassium chloride SA (K-DUR,KLOR-CON) 20 MEQ tablet TAKE 1 TABLET BY MOUTH TWICE DAILY 180 tablet 2  . Prucalopride Succinate (MOTEGRITY) 2 MG TABS Take 1 tablet by mouth daily. 90 tablet 1  . spironolactone (ALDACTONE) 25 MG tablet TAKE 1 TABLET BY MOUTH TWICE DAILY 180 tablet 0  . telmisartan (MICARDIS) 40 MG tablet TAKE 1 TABLET BY MOUTH EVERY DAY 90 tablet 1  . triamcinolone cream (KENALOG) 0.5 % Apply 1 application topically 3 (three) times daily. 60 g 3  . warfarin (COUMADIN) 6 MG tablet TAKE BY MOUTH AS DIRECTED BY ANTICOAGULATION CLINIC. 135 tablet 0  . fluticasone (FLONASE) 50 MCG/ACT nasal spray Place 2 sprays into  both nostrils daily. 16 g 1  . loratadine (CLARITIN) 10 MG tablet Take 1 tablet (10 mg total) by mouth daily. 30 tablet 1   No current facility-administered medications for this visit.     Allergies: Metformin and related; Cleocin [clindamycin hcl]; Codeine; Doxycycline hyclate; Macrolides and ketolides; Morphine; Pentazocine lactate; Vibramycin [doxycycline calcium]; Definity [perflutren lipid microsphere]; and Sulfonamide derivatives  Past Medical History:  Diagnosis Date  . Arthritis   . Blood transfusion without  reported diagnosis   . Bursitis   . CAD (coronary artery disease)    a. s/p CABG 2004.b. stable cath 2014 demonstrating stable CAD and continued patency of her LIMA graft.  . Chronic anticoagulation    on coumadin  . DDD (degenerative disc disease), lumbar   . Depression   . Diabetes mellitus   . Diastolic dysfunction    per echo in October 2012 with EF 50 to 55%  . Fibromyalgia   . GERD (gastroesophageal reflux disease) 10/23/2003  . Headache(784.0)   . Hyperlipidemia   . Hypertension   . Hypokalemia   . LBBB (left bundle branch block)   . Lumbar back pain   . LV dysfunction    a. EF 45% by cath 2014. b. EF 50-55% by technically difficult echo in 08/2014.  Marland Kitchen Lymphadenitis   . Morbid obesity ([place])    a. Sleep study negative for significant OSA in 11/2014.  Marland Kitchen PAF (paroxysmal atrial fibrillation) ([place])   . Stroke [facility]) 2004   affected speech per pt    Past Surgical History:  Procedure Laterality Date  . ABDOMINAL HYSTERECTOMY    . ANGIOPLASTY  laminectomy  . CHOLECYSTECTOMY    . CORONARY ARTERY BYPASS GRAFT     LIMA to LAD   . KNEE ARTHROSCOPY    . LEFT HEART CATHETERIZATION WITH CORONARY/GRAFT ANGIOGRAM  12/07/2012   Procedure: LEFT HEART CATHETERIZATION WITH Beatrix Fetters;  Surgeon: Blane Ohara, MD;  Location: [facility] CATH LAB;  Service: Cardiovascular;;  . LUMBAR LAMINECTOMY     x3  . SPHINCTEROTOMY    . TONSILLECTOMY      Family History  Problem Relation Age of Onset  . Heart attack Father   . Heart disease Father   . Stroke Mother   . Kidney disease Other   . Stroke Other   . Arthritis Other   . Hypertension Other   . Diabetes Other   . Colon cancer Paternal Uncle   . Depression Sister   . Anxiety disorder Maternal Aunt     Social History   Tobacco Use  . Smoking status: Former Smoker    Last attempt to quit: 03/07/2001    Years since quitting: 17.1  . Smokeless tobacco: Never Used  Substance Use Topics  . Alcohol use: No     Subjective:   Ms [name] is here today requesting evaluation of acute complaint of cough/cold symptoms, which first began about 1 week ago, first woke last Sunday/Monday with sore throat, hoarse voice, cough, which all improved after about 2 days and have now resolved. Since then she's continued to have nasal congestion, rhinorrhea, clear drainage, says she overall feels well aside from her runny nose. Denies: fevers, chills, syncope, confusion, headaches, ear pain/ pressure, sore throat, chest pain, shortness of breath, wheezing, abdominal pain, nausea,vomiting,  Diarrhea. Smoker? Former, quit in 2003 Tried at home: nothing, says she prefers to see provider first due to extensive medical history No recent travel, husband with similar symptoms  ROS- See HPI  Objective:  Vitals:   04/23/18 1412  BP: 110/80  Pulse: 87  Temp: 97.8 F (36.6 C)  TempSrc: Oral  SpO2: 97%  Weight: 207 lb (93.9 kg)  Height: 5' 0.5" (1.537 m)    General: Well developed, well nourished, in no acute distress  Skin : Warm and dry.  Head: Normocephalic and atraumatic  Eyes: Sclera and conjunctiva clear; pupils round and reactive to light; extraocular movements intact  Ears: External normal; canals clear; tympanic membranes normal  Oropharynx: Pink, supple. No suspicious lesions  Neck: Supple without adenopathy  Lungs: Respirations unlabored; clear to auscultation bilaterally without wheeze, rales, rhonchi  CVS exam: normal rate and regular rhythm, S1 and S2 normal.  Extremities: No edema, cyanosis, clubbing  Vessels: Symmetric bilaterally  Neurologic: Alert and oriented; speech intact; face symmetrical; moves all extremities well; CNII-XII intact without focal deficit  Psychiatric: Normal mood and affect.  Assessment:  1. Upper respiratory tract infection, unspecified type     Plan:   VS, PE are normal Her symptoms are improving Suspect viral URI/allergies- will treat symptoms with antihistamine and ICS course-  claritin, flonase sent- dosing, side effects discussed Home management, OTC medications, red flags and return precautions including when to seek immediate care discussed and printed on AVS She will F/U for new, worsening, recurrent symptoms or if symptoms are not better in 1 week  No follow-ups on file.  No orders of the defined types were placed in this encounter.   Requested Prescriptions   Signed Prescriptions Disp Refills  . fluticasone (FLONASE) 50 MCG/ACT nasal spray 16 g 1    Sig: Place 2 sprays into both nostrils daily.  Marland Kitchen loratadine (CLARITIN) 10 MG tablet 30 tablet 1    Sig: Take 1 tablet (10 mg total) by mouth daily.

## 2018-04-23 NOTE — Patient Instructions (Signed)
I have sent a prescription for flonase nasal spray to your pharmacy- you may start with 2 sprays in each nostril daily then reduce to 1 spray in each nostril daily when your symptoms improve I have also sent prescription for claritin once daily  Please follow up for fevers over 101, if your symptoms get worse/return, or if your symptoms dont get better in 1 week   Upper Respiratory Infection, Adult An upper respiratory infection (URI) affects the nose, throat, and upper air passages. URIs are caused by germs (viruses). The most common type of URI is often called "the common cold." Medicines cannot cure URIs, but you can do things at home to relieve your symptoms. URIs usually get better within 7-10 days. Follow these instructions at home: Activity  Rest as needed.  If you have a fever, stay home from work or school until your fever is gone, or until your doctor says you may return to work or school. ? You should stay home until you cannot spread the infection anymore (you are not contagious). ? Your doctor may have you wear a face mask so you have less risk of spreading the infection. Relieving symptoms  Gargle with a salt-water mixture 3-4 times a day or as needed. To make a salt-water mixture, completely dissolve -1 tsp of salt in 1 cup of warm water.  Use a cool-mist humidifier to add moisture to the air. This can help you breathe more easily. Eating and drinking   Drink enough fluid to keep your pee (urine) pale yellow.  Eat soups and other clear broths. General instructions   Take over-the-counter and prescription medicines only as told by your doctor. These include cold medicines, fever reducers, and cough suppressants.  Do not use any products that contain nicotine or tobacco. These include cigarettes and e-cigarettes. If you need help quitting, ask your doctor.  Avoid being where people are smoking (avoid secondhand smoke).  Make sure you get regular shots and get the  flu shot every year.  Keep all follow-up visits as told by your doctor. This is important. How to avoid spreading infection to others   Wash your hands often with soap and water. If you do not have soap and water, use hand sanitizer.  Avoid touching your mouth, face, eyes, or nose.  Cough or sneeze into a tissue or your sleeve or elbow. Do not cough or sneeze into your hand or into the air. Contact a doctor if:  You are getting worse, not better.  You have any of these: ? A fever. ? Chills. ? Brown or red mucus in your nose. ? Yellow or brown fluid (discharge)coming from your nose. ? Pain in your face, especially when you bend forward. ? Swollen neck glands. ? Pain with swallowing. ? White areas in the back of your throat. Get help right away if:  You have shortness of breath that gets worse.  You have very bad or constant: ? Headache. ? Ear pain. ? Pain in your forehead, behind your eyes, and over your cheekbones (sinus pain). ? Chest pain.  You have long-lasting (chronic) lung disease along with any of these: ? Wheezing. ? Long-lasting cough. ? Coughing up blood. ? A change in your usual mucus.  You have a stiff neck.  You have changes in your: ? Vision. ? Hearing. ? Thinking. ? Mood. Summary  An upper respiratory infection (URI) is caused by a germ called a virus. The most common type of URI is often called "  the common cold."  URIs usually get better within 7-10 days.  Take over-the-counter and prescription medicines only as told by your doctor. This information is not intended to replace advice given to you by your health care provider. Make sure you discuss any questions you have with your health care provider. Document Released: 08/10/2007 Document Revised: 10/14/2016 Document Reviewed: 10/14/2016 Elsevier Interactive Patient Education  2019 [employer].

## 2018-05-01 ENCOUNTER — Other Ambulatory Visit: Payer: Self-pay

## 2018-05-01 NOTE — Patient Outreach (Signed)
[facility]risten [name]) Care Management  05/01/2018   Kristen [name] February 13, 1946 [id number]  Subjective: Telephone outreach Successful. HIPAA verified. The patient states that her blood sugar today was 146. She states that her food intake is part of the problem.  Her husband usually cooks and he like spaghetti, fried pork chops rice and gravy and does not stray to far from what he likes.  Food choices are limited due to cost to separate her food from his.  Discussed healthy food choices with the patient and will mail her handout "All about the carbs" to review with her husband.  She verbalized understanding. The patient denies any falls but has chronic pain in her legs back and left shoulder. She would like to exercise but due to the pain in her legs it is hard to try to walk at long distances.  She states that she would like to purchase on of the foot exercise bikes that she can use while sitting in the chair.  I encouraged the patient to speak with her physician to make sure it would not hurt her knees.  She verbalized that she will.  She has an appointment in March to follow up on her diabetes.  Current Medications:  Current Outpatient Medications  Medication Sig Dispense Refill  . ALPRAZolam (XANAX) 1 MG tablet Take 1 tablet (1 mg total) by mouth 3 (three) times daily as needed for anxiety. 90 tablet 5  . atorvastatin (LIPITOR) 40 MG tablet TAKE 1 TABLET BY MOUTH DAILY 90 tablet 1  . Blood Glucose Monitoring Suppl (ONE TOUCH ULTRA 2) w/Device KIT Use as advised 1 each 0  . empagliflozin (JARDIANCE) 25 MG TABS tablet Take 25 mg by mouth daily. 30 tablet 6  . fluticasone (FLONASE) 50 MCG/ACT nasal spray USE 2 SPRAYS IN EACH NOSTRIL DAILY 48 g 1  . furosemide (LASIX) 20 MG tablet TAKE 1 TABLET BY MOUTH DAILY 90 tablet 0  . gabapentin (NEURONTIN) 300 MG capsule TAKE ONE CAPSULE BY MOUTH THREE TIMES DAILY 90 capsule 5  . glipiZIDE (GLUCOTROL) 5 MG tablet Take 2 tablets 2x a day before  meals 120 tablet 3  . liraglutide (VICTOZA) 18 MG/3ML SOPN Inject 0.3 mLs (1.8 mg total) into the skin every morning. 27 mL 3  . loratadine (CLARITIN) 10 MG tablet Take 1 tablet (10 mg total) by mouth daily. 30 tablet 1  . MAGNESIUM-OXIDE 400 (241.3 Mg) MG tablet TAKE 1 TABLET BY MOUTH TWICE DAILY 60 tablet 5  . metFORMIN (GLUCOPHAGE-XR) 500 MG 24 hr tablet Take 2 tablets (1,000 mg total) by mouth 2 (two) times daily with a meal. 360 tablet 3  . metoprolol succinate (TOPROL-XL) 100 MG 24 hr tablet TAKE 1 TABLET BY MOUTH TWICE DAILY 180 tablet 3  . nitroGLYCERIN (NITROSTAT) 0.4 MG SL tablet Place 0.4 mg under the tongue every 5 (five) minutes as needed for chest pain (x 3 doses). Reported on 05/08/2015    . ONE TOUCH ULTRA TEST test strip USE TO TEST BLOOD SUGAR FOUR TIMES DAILY AS DIRECTED 250 each 1  . oxyCODONE-acetaminophen (PERCOCET) 10-325 MG tablet Take 1 tablet by mouth every 4 (four) hours as needed for pain.    . potassium chloride SA (K-DUR,KLOR-CON) 20 MEQ tablet TAKE 1 TABLET BY MOUTH TWICE DAILY 180 tablet 2  . Prucalopride Succinate (MOTEGRITY) 2 MG TABS Take 1 tablet by mouth daily. 90 tablet 1  . spironolactone (ALDACTONE) 25 MG tablet TAKE 1 TABLET BY MOUTH TWICE DAILY 180 tablet  0  . telmisartan (MICARDIS) 40 MG tablet TAKE 1 TABLET BY MOUTH EVERY DAY 90 tablet 1  . triamcinolone cream (KENALOG) 0.5 % Apply 1 application topically 3 (three) times daily. 60 g 3  . warfarin (COUMADIN) 6 MG tablet TAKE BY MOUTH AS DIRECTED BY ANTICOAGULATION CLINIC. 135 tablet 0   No current facility-administered medications for this visit.     Functional Status:  In your present state of health, do you have any difficulty performing the following activities: 02/16/2018 12/29/2017  Hearing? N N  Vision? N N  Difficulty concentrating or making decisions? N Y  Walking or climbing stairs? Y N  Dressing or bathing? N N  Doing errands, shopping? Y Y  Some recent data might be hidden     Fall/Depression Screening: Fall Risk  05/01/2018 02/16/2018 12/29/2017  Falls in the past year? 0 0 No  Risk for fall due to : - - -   PHQ 2/9 Scores 02/16/2018 01/02/2018 12/29/2017 08/07/2017 10/12/2016 09/01/2015 08/09/2013  PHQ - 2 Score 1 0 0 0 1 0 1  PHQ- 9 Score - - - 2 5 - -    Assessment: Patient will continue to benefit from health coach outreach for disease management and support. [facility] CM Care Plan Problem One     Most Recent Value  [facility] Long Term Goal   In 30 days the patient will verbalize that her a1c has lowered 1-2 points  [facility] Long Term Goal Start Date  05/01/18  Interventions for Problem One Long Term Goal  Discussed the cbg readings, talked about a low carb diet and will send the patient all about the carbs handout.  Continue to take medications as prescribed.  the patient has a follow up appointment in March encourged the patient to keep her appointment.     Plan: RN Health Coach will contact patient in the month of April and patient agrees to next outreach. Will  Mail handout All about the carbs.  [name] RN, BSN, [place] Direct Dial:  [phone]  Fax: [phone]

## 2018-05-09 DIAGNOSIS — M25512 Pain in left shoulder: Secondary | ICD-10-CM | POA: Diagnosis not present

## 2018-05-09 DIAGNOSIS — G894 Chronic pain syndrome: Secondary | ICD-10-CM | POA: Diagnosis not present

## 2018-05-09 DIAGNOSIS — M25511 Pain in right shoulder: Secondary | ICD-10-CM | POA: Diagnosis not present

## 2018-05-09 DIAGNOSIS — Z79891 Long term (current) use of opiate analgesic: Secondary | ICD-10-CM | POA: Diagnosis not present

## 2018-05-11 ENCOUNTER — Ambulatory Visit (INDEPENDENT_AMBULATORY_CARE_PROVIDER_SITE_OTHER): Payer: Medicare Other | Admitting: General Practice

## 2018-05-11 DIAGNOSIS — I480 Paroxysmal atrial fibrillation: Secondary | ICD-10-CM

## 2018-05-11 DIAGNOSIS — Z7901 Long term (current) use of anticoagulants: Secondary | ICD-10-CM

## 2018-05-11 LAB — POCT INR: INR: 2.3 (ref 2.0–3.0)

## 2018-05-11 NOTE — Patient Instructions (Addendum)
Pre visit review using our clinic review tool, if applicable. No additional management support is needed unless otherwise documented below in the visit note.  Continue to take 1/2 tablet all days except 1 tablet on Monday/Fridays.  Re-check in 4 weeks.

## 2018-05-14 ENCOUNTER — Other Ambulatory Visit: Payer: Self-pay | Admitting: Internal Medicine

## 2018-05-14 ENCOUNTER — Encounter: Payer: Self-pay | Admitting: Internal Medicine

## 2018-05-15 ENCOUNTER — Other Ambulatory Visit: Payer: Self-pay | Admitting: Internal Medicine

## 2018-05-15 ENCOUNTER — Other Ambulatory Visit: Payer: Self-pay

## 2018-05-15 MED ORDER — GLIPIZIDE 5 MG PO TABS: ORAL_TABLET | ORAL | 3 refills | Status: DC

## 2018-05-17 ENCOUNTER — Other Ambulatory Visit: Payer: Self-pay | Admitting: Internal Medicine

## 2018-05-17 DIAGNOSIS — I152 Hypertension secondary to endocrine disorders: Secondary | ICD-10-CM

## 2018-05-17 DIAGNOSIS — I10 Essential (primary) hypertension: Principal | ICD-10-CM

## 2018-05-17 DIAGNOSIS — H0100A Unspecified blepharitis right eye, upper and lower eyelids: Secondary | ICD-10-CM | POA: Diagnosis not present

## 2018-05-17 DIAGNOSIS — E119 Type 2 diabetes mellitus without complications: Secondary | ICD-10-CM | POA: Diagnosis not present

## 2018-05-17 DIAGNOSIS — E1159 Type 2 diabetes mellitus with other circulatory complications: Secondary | ICD-10-CM

## 2018-05-17 DIAGNOSIS — H52203 Unspecified astigmatism, bilateral: Secondary | ICD-10-CM | POA: Diagnosis not present

## 2018-05-17 DIAGNOSIS — H25813 Combined forms of age-related cataract, bilateral: Secondary | ICD-10-CM | POA: Diagnosis not present

## 2018-05-17 LAB — HM DIABETES EYE EXAM

## 2018-05-17 MED ORDER — FUROSEMIDE 20 MG PO TABS: 20.0000 mg | ORAL_TABLET | Freq: Every day | ORAL | 1 refills | Status: DC

## 2018-05-20 ENCOUNTER — Other Ambulatory Visit (HOSPITAL_COMMUNITY): Payer: Self-pay | Admitting: Psychiatry

## 2018-05-21 ENCOUNTER — Encounter: Payer: Self-pay | Admitting: Internal Medicine

## 2018-05-22 ENCOUNTER — Ambulatory Visit: Payer: Self-pay | Admitting: Internal Medicine

## 2018-05-28 ENCOUNTER — Ambulatory Visit: Payer: Self-pay

## 2018-05-28 ENCOUNTER — Other Ambulatory Visit: Payer: Self-pay | Admitting: Cardiovascular Disease

## 2018-05-28 DIAGNOSIS — I480 Paroxysmal atrial fibrillation: Secondary | ICD-10-CM

## 2018-06-07 DIAGNOSIS — Z79891 Long term (current) use of opiate analgesic: Secondary | ICD-10-CM | POA: Diagnosis not present

## 2018-06-07 DIAGNOSIS — M25511 Pain in right shoulder: Secondary | ICD-10-CM | POA: Diagnosis not present

## 2018-06-07 DIAGNOSIS — G894 Chronic pain syndrome: Secondary | ICD-10-CM | POA: Diagnosis not present

## 2018-06-07 DIAGNOSIS — M25512 Pain in left shoulder: Secondary | ICD-10-CM | POA: Diagnosis not present

## 2018-06-08 ENCOUNTER — Other Ambulatory Visit: Payer: Self-pay | Admitting: General Practice

## 2018-06-08 ENCOUNTER — Other Ambulatory Visit: Payer: Self-pay

## 2018-06-08 ENCOUNTER — Ambulatory Visit: Payer: Self-pay

## 2018-06-08 ENCOUNTER — Ambulatory Visit (INDEPENDENT_AMBULATORY_CARE_PROVIDER_SITE_OTHER): Payer: Medicare Other | Admitting: General Practice

## 2018-06-08 DIAGNOSIS — Z7901 Long term (current) use of anticoagulants: Secondary | ICD-10-CM

## 2018-06-08 DIAGNOSIS — I480 Paroxysmal atrial fibrillation: Secondary | ICD-10-CM

## 2018-06-08 LAB — POCT INR: INR: 4.1 — AB (ref 2.0–3.0)

## 2018-06-08 MED ORDER — WARFARIN SODIUM 3 MG PO TABS: ORAL_TABLET | ORAL | 2 refills | Status: DC

## 2018-06-08 NOTE — Patient Instructions (Addendum)
Pre visit review using our clinic review tool, if applicable. No additional management support is needed unless otherwise documented below in the visit note.  Hold coumadin today and tomorrow (4/3 and 4/4) and then continue to take 1/2 tablet all days except 1 tablet on Monday/Fridays.  Re-check in 3 weeks.

## 2018-06-12 ENCOUNTER — Other Ambulatory Visit: Payer: Self-pay | Admitting: Cardiovascular Disease

## 2018-06-18 ENCOUNTER — Other Ambulatory Visit: Payer: Self-pay | Admitting: Internal Medicine

## 2018-06-20 ENCOUNTER — Ambulatory Visit (INDEPENDENT_AMBULATORY_CARE_PROVIDER_SITE_OTHER): Payer: Medicare Other | Admitting: Psychiatry

## 2018-06-20 ENCOUNTER — Other Ambulatory Visit: Payer: Self-pay

## 2018-06-20 ENCOUNTER — Encounter (HOSPITAL_COMMUNITY): Payer: Self-pay | Admitting: Psychiatry

## 2018-06-20 DIAGNOSIS — F4323 Adjustment disorder with mixed anxiety and depressed mood: Secondary | ICD-10-CM

## 2018-06-20 MED ORDER — ALPRAZOLAM 1 MG PO TABS: 1.0000 mg | ORAL_TABLET | Freq: Three times a day (TID) | ORAL | 5 refills | Status: DC | PRN

## 2018-06-20 NOTE — Progress Notes (Signed)
None Patient ID: Kristen [name], female   DOB: April 22, 1945, 73 y.o.   MRN: [id number] [facility] MD Progress Note  06/20/2018 2:23 PM Kristen [name]  MRN:  [id number] Subjective:  Scared Principal Problem: Adjustment disorder with an anxious mood state  Today the patient is doing well.  She continues taking her medicines as prescribed.  Her and her husband are doing well.  She is sleeping and eating well.  She is got good energy.  She is enjoying life.  She watches TV in bed.  She tries to walk some but she has a lot of joint pain.  Her children are very supportive.  They are helping her through the viral epidemic.  The patient denies use of alcohol or drugs.  She takes all of her medicines appropriately.  She is positive and optimistic.  She is very spiritual.  The patient denies depression or anhedonia.  She denies any irritability.  She has no symptoms of panic or any other anxiety disorder other than an adjustment disorder with an anxious mood state.  This is directly related to her multiple episodes of pain.  She also has some features of generalized anxiety disorder.  She never showed any signs of abusing her medicines.    Past Medical History:  Diagnosis Date  . Arthritis   . Blood transfusion without reported diagnosis   . Bursitis   . CAD (coronary artery disease)    a. s/p CABG 2004.b. stable cath 2014 demonstrating stable CAD and continued patency of her LIMA graft.  . Chronic anticoagulation    on coumadin  . DDD (degenerative disc disease), lumbar   . Depression   . Diabetes mellitus   . Diastolic dysfunction    per echo in October 2012 with EF 50 to 55%  . Fibromyalgia   . GERD (gastroesophageal reflux disease) 10/23/2003  . Headache(784.0)   . Hyperlipidemia   . Hypertension   . Hypokalemia   . LBBB (left bundle branch block)   . Lumbar back pain   . LV dysfunction    a. EF 45% by cath 2014. b. EF 50-55% by technically difficult echo in 08/2014.  Marland Kitchen Lymphadenitis    . Morbid obesity ([place])    a. Sleep study negative for significant OSA in 11/2014.  Marland Kitchen PAF (paroxysmal atrial fibrillation) ([place])   . Stroke [facility]) 2004   affected speech per pt    Past Surgical History:  Procedure Laterality Date  . ABDOMINAL HYSTERECTOMY    . ANGIOPLASTY  laminectomy  . CHOLECYSTECTOMY    . CORONARY ARTERY BYPASS GRAFT     LIMA to LAD   . KNEE ARTHROSCOPY    . LEFT HEART CATHETERIZATION WITH CORONARY/GRAFT ANGIOGRAM  12/07/2012   Procedure: LEFT HEART CATHETERIZATION WITH Beatrix Fetters;  Surgeon: Blane Ohara, MD;  [facility];  Service: Cardiovascular;;  . LUMBAR LAMINECTOMY     x3  . SPHINCTEROTOMY    . TONSILLECTOMY     Family History:  Family History  Problem Relation Age of Onset  . Heart attack Father   . Heart disease Father   . Stroke Mother   . Kidney disease Other   . Stroke Other   . Arthritis Other   . Hypertension Other   . Diabetes Other   . Colon cancer Paternal Uncle   . Depression Sister   . Anxiety disorder Maternal Aunt    Family Psychiatric  History:  Social History:  Social History   Substance and  Sexual Activity  Alcohol Use No     Social History   Substance and Sexual Activity  Drug Use No    Social History   Socioeconomic History  . Marital status: Married    Spouse name: Not on file  . Number of children: Not on file  . Years of education: Not on file  . Highest education level: Not on file  Occupational History  . Not on file  Social Needs  . Financial resource strain: Not on file  . Food insecurity:    Worry: Not on file    Inability: Not on file  . Transportation needs:    Medical: Not on file    Non-medical: Not on file  Tobacco Use  . Smoking status: Former Smoker    Last attempt to quit: 03/07/2001    Years since quitting: 17.2  . Smokeless tobacco: Never Used  Substance and Sexual Activity  . Alcohol use: No  . Drug use: No  . Sexual activity: Not Currently  Lifestyle  .  Physical activity:    Days per week: Not on file    Minutes per session: Not on file  . Stress: Not on file  Relationships  . Social connections:    Talks on phone: Not on file    Gets together: Not on file    Attends religious service: Not on file    Active member of club or organization: Not on file    Attends meetings of clubs or organizations: Not on file    Relationship status: Not on file  Other Topics Concern  . Not on file  Social History Narrative   Lives locally, has help available if needed.   Additional Social History:                         Sleep: Fair  Appetite:  Fair  Current Medications: Current Outpatient Medications  Medication Sig Dispense Refill  . ALPRAZolam (XANAX) 1 MG tablet Take 1 tablet (1 mg total) by mouth 3 (three) times daily as needed for anxiety. 90 tablet 5  . atorvastatin (LIPITOR) 40 MG tablet TAKE 1 TABLET BY MOUTH DAILY 90 tablet 1  . Blood Glucose Monitoring Suppl (ONE TOUCH ULTRA 2) w/Device KIT Use as advised 1 each 0  . empagliflozin (JARDIANCE) 25 MG TABS tablet Take 25 mg by mouth daily. 30 tablet 6  . fluticasone (FLONASE) 50 MCG/ACT nasal spray USE 2 SPRAYS IN EACH NOSTRIL DAILY 48 g 1  . furosemide (LASIX) 20 MG tablet Take 1 tablet (20 mg total) by mouth daily. 90 tablet 1  . gabapentin (NEURONTIN) 300 MG capsule TAKE ONE CAPSULE BY MOUTH THREE TIMES DAILY 90 capsule 5  . glipiZIDE (GLUCOTROL) 5 MG tablet TAKE 2 TABLETS BY MOUTH TWICE DAILY BEFORE MEALS 120 tablet 3  . glipiZIDE (GLUCOTROL) 5 MG tablet Take 2 tablets 2x a day before meals 120 tablet 3  . liraglutide (VICTOZA) 18 MG/3ML SOPN Inject 0.3 mLs (1.8 mg total) into the skin every morning. 27 mL 3  . loratadine (CLARITIN) 10 MG tablet Take 1 tablet (10 mg total) by mouth daily. 30 tablet 1  . MAGNESIUM-OXIDE 400 (241.3 Mg) MG tablet TAKE 1 TABLET BY MOUTH TWICE DAILY 60 tablet 5  . metFORMIN (GLUCOPHAGE-XR) 500 MG 24 hr tablet Take 2 tablets (1,000 mg total) by  mouth 2 (two) times daily with a meal. 360 tablet 3  . metoprolol succinate (TOPROL-XL) 100 MG 24  hr tablet TAKE 1 TABLET BY MOUTH TWICE DAILY 180 tablet 1  . nitroGLYCERIN (NITROSTAT) 0.4 MG SL tablet Place 0.4 mg under the tongue every 5 (five) minutes as needed for chest pain (x 3 doses). Reported on 05/08/2015    . ONE TOUCH ULTRA TEST test strip USE TO TEST BLOOD SUGAR FOUR TIMES DAILY AS DIRECTED 300 each 2  . oxyCODONE-acetaminophen (PERCOCET) 10-325 MG tablet Take 1 tablet by mouth every 4 (four) hours as needed for pain.    . potassium chloride SA (K-DUR,KLOR-CON) 20 MEQ tablet TAKE 1 TABLET BY MOUTH TWICE DAILY. 180 tablet 1  . Prucalopride Succinate (MOTEGRITY) 2 MG TABS Take 1 tablet by mouth daily. 90 tablet 1  . spironolactone (ALDACTONE) 25 MG tablet TAKE 1 TABLET BY MOUTH TWICE DAILY 180 tablet 0  . telmisartan (MICARDIS) 40 MG tablet TAKE 1 TABLET BY MOUTH EVERY DAY 90 tablet 1  . triamcinolone cream (KENALOG) 0.5 % Apply 1 application topically 3 (three) times daily. 60 g 3  . warfarin (COUMADIN) 3 MG tablet Take 1 tablet daily or As directed by anticoagulation clinic 30 tablet 2   No current facility-administered medications for this visit.     Lab Results:  No results found. However, due to the size of the patient record, not all encounters were searched. Please check Results Review for a complete set of results.  Physical Findings: AIMS:  , ,  ,  ,    CIWA:    COWS:     Musculoskeletal: Strength & Muscle Tone: within normal limits Gait & Station: normal Patient leans: Right  Psychiatric Specialty Exam: ROS  There were no vitals taken for this visit.There is no height or weight on file to calculate BMI.  General Appearance: NA  Eye Contact::  Good  Speech:  Clear and Coherent  Volume:  Normal  Mood:  NA  Affect:  Congruent  Thought Process:  Coherent  Orientation:  Full (Time, Place, and Person)  Thought Content:  WDL  Suicidal Thoughts:  No  Homicidal  Thoughts:  No  Memory:  NA  Judgement:  Good  Insight:  Good  Psychomotor Activity:  Normal  Concentration:  Good  Recall:  Good  Fund of Knowledge:Fair  Language: Good  Akathisia:  No  Handed:  Left  AIMS (if indicated):     Assets:  Desire for Improvement  ADL's:  Intact  Cognition: WNL  Sleep:      Treatment Plan Summary:  At this time the patient is 1 problem.  She is generalized anxiety disorder.  She is been on Xanax for well over a decade.  Efforts to changes have led to great problems.  She takes it just as prescribed 1 mg 3 times a day.  With this she sleeps well has good energy.  She has no falls.  She is not oversedated.  Cognitively she is very intact.  She is pleasant appropriate and doing great.  She will be seen again in 4 months.

## 2018-06-25 ENCOUNTER — Ambulatory Visit: Payer: Self-pay | Admitting: Internal Medicine

## 2018-06-29 ENCOUNTER — Other Ambulatory Visit: Payer: Self-pay

## 2018-06-29 ENCOUNTER — Ambulatory Visit (INDEPENDENT_AMBULATORY_CARE_PROVIDER_SITE_OTHER): Payer: Medicare Other | Admitting: General Practice

## 2018-06-29 DIAGNOSIS — I480 Paroxysmal atrial fibrillation: Secondary | ICD-10-CM

## 2018-06-29 DIAGNOSIS — Z7901 Long term (current) use of anticoagulants: Secondary | ICD-10-CM | POA: Diagnosis not present

## 2018-06-29 LAB — POCT INR: INR: 2.8 (ref 2.0–3.0)

## 2018-06-29 NOTE — Patient Instructions (Addendum)
Pre visit review using our clinic review tool, if applicable. No additional management support is needed unless otherwise documented below in the visit note.  Continue to take 1/2 tablet all days except 1 tablet on Monday/Fridays.  Re-check in 4 to 5 weeks.

## 2018-06-29 NOTE — Patient Outreach (Signed)
[facility]) Care Management  06/29/2018  Kenzy Campoverde [birth year] [id number]    1st outreach attempt to the patient for assessment.  No answer.  HIPAA compliant voicemail left with contact information.  Plan: RN Health Coach will send letter. [place] will make outreach attempt to the patient within thirty business days.  [name] RN, BSN, [place] Direct Dial:  [phone]  Fax: [phone]

## 2018-07-03 ENCOUNTER — Other Ambulatory Visit: Payer: Self-pay

## 2018-07-03 NOTE — Patient Outreach (Signed)
[facility]) Care Management  07/03/2018  Josslin Sanjuan 10-26-1945 [id number]    2nd attempt to outreach the patient for assessment.  No answer.  HIPAA compliant voicemail left with contact information.  Plan: RN Health Coach will make outreach attempt to the patient within thirty business days.  [name] RN, BSN, [place] Direct Dial:  [phone]  Fax: [phone]

## 2018-07-04 ENCOUNTER — Ambulatory Visit: Payer: Self-pay

## 2018-07-04 NOTE — Telephone Encounter (Signed)
Patient called in with c/o chest burning. She says when her HR goes up, it burns and the heart rate was 102 about 15 minutes ago. She says the burning has gone away and only lasted a few minutes. HR now is 96 BP 152/76. She denies CP.   Reason for Disposition . Nursing judgment  Protocols used: NO GUIDELINE OR REFERENCE AVAILABLE-A-AH

## 2018-07-04 NOTE — Telephone Encounter (Signed)
Virtual scheduled  °

## 2018-07-05 ENCOUNTER — Encounter: Payer: Self-pay | Admitting: Internal Medicine

## 2018-07-05 ENCOUNTER — Ambulatory Visit (INDEPENDENT_AMBULATORY_CARE_PROVIDER_SITE_OTHER): Payer: Medicare Other | Admitting: Internal Medicine

## 2018-07-05 ENCOUNTER — Other Ambulatory Visit (INDEPENDENT_AMBULATORY_CARE_PROVIDER_SITE_OTHER): Payer: Medicare Other

## 2018-07-05 ENCOUNTER — Other Ambulatory Visit: Payer: Self-pay

## 2018-07-05 ENCOUNTER — Ambulatory Visit: Payer: Medicare Other | Admitting: Internal Medicine

## 2018-07-05 VITALS — BP 120/66 | HR 109 | Temp 98.0°F | Ht 60.5 in | Wt 209.0 lb

## 2018-07-05 DIAGNOSIS — R0609 Other forms of dyspnea: Secondary | ICD-10-CM | POA: Diagnosis not present

## 2018-07-05 DIAGNOSIS — I10 Essential (primary) hypertension: Secondary | ICD-10-CM

## 2018-07-05 DIAGNOSIS — M25511 Pain in right shoulder: Secondary | ICD-10-CM | POA: Diagnosis not present

## 2018-07-05 DIAGNOSIS — E039 Hypothyroidism, unspecified: Secondary | ICD-10-CM

## 2018-07-05 DIAGNOSIS — E1159 Type 2 diabetes mellitus with other circulatory complications: Secondary | ICD-10-CM | POA: Diagnosis not present

## 2018-07-05 DIAGNOSIS — M25512 Pain in left shoulder: Secondary | ICD-10-CM | POA: Diagnosis not present

## 2018-07-05 DIAGNOSIS — G894 Chronic pain syndrome: Secondary | ICD-10-CM | POA: Diagnosis not present

## 2018-07-05 DIAGNOSIS — I480 Paroxysmal atrial fibrillation: Secondary | ICD-10-CM

## 2018-07-05 DIAGNOSIS — Z79891 Long term (current) use of opiate analgesic: Secondary | ICD-10-CM | POA: Diagnosis not present

## 2018-07-05 DIAGNOSIS — I2510 Atherosclerotic heart disease of native coronary artery without angina pectoris: Secondary | ICD-10-CM

## 2018-07-05 DIAGNOSIS — E1151 Type 2 diabetes mellitus with diabetic peripheral angiopathy without gangrene: Secondary | ICD-10-CM | POA: Diagnosis not present

## 2018-07-05 DIAGNOSIS — R079 Chest pain, unspecified: Secondary | ICD-10-CM

## 2018-07-05 LAB — CBC WITH DIFFERENTIAL/PLATELET
Basophils Absolute: 0.1 10*3/uL (ref 0.0–0.1)
Basophils Relative: 0.6 % (ref 0.0–3.0)
Eosinophils Absolute: 0.1 10*3/uL (ref 0.0–0.7)
Eosinophils Relative: 0.7 % (ref 0.0–5.0)
HCT: 38.2 % (ref 36.0–46.0)
Hemoglobin: 13.1 g/dL (ref 12.0–15.0)
Lymphocytes Relative: 33.5 % (ref 12.0–46.0)
Lymphs Abs: 2.9 10*3/uL (ref 0.7–4.0)
MCHC: 34.2 g/dL (ref 30.0–36.0)
MCV: 124.9 fl — ABNORMAL HIGH (ref 78.0–100.0)
Monocytes Absolute: 0.5 10*3/uL (ref 0.1–1.0)
Monocytes Relative: 5.2 % (ref 3.0–12.0)
Neutro Abs: 5.2 10*3/uL (ref 1.4–7.7)
Neutrophils Relative %: 60 % (ref 43.0–77.0)
Platelets: 386 10*3/uL (ref 150.0–400.0)
RBC: 3.06 Mil/uL — ABNORMAL LOW (ref 3.87–5.11)
RDW: 18.7 % — ABNORMAL HIGH (ref 11.5–15.5)
WBC: 8.7 10*3/uL (ref 4.0–10.5)

## 2018-07-05 LAB — BASIC METABOLIC PANEL
BUN: 18 mg/dL (ref 6–23)
CO2: 21 mEq/L (ref 19–32)
Calcium: 9.5 mg/dL (ref 8.4–10.5)
Chloride: 103 mEq/L (ref 96–112)
Creatinine, Ser: 0.81 mg/dL (ref 0.40–1.20)
GFR: 84.46 mL/min (ref >60.00)
Glucose, Bld: 202 mg/dL — ABNORMAL HIGH (ref 70–99)
Potassium: 4.1 mEq/L (ref 3.5–5.1)
Sodium: 138 mEq/L (ref 135–145)

## 2018-07-05 LAB — POCT GLYCOSYLATED HEMOGLOBIN (HGB A1C): Hemoglobin A1C: 8.8 % — AB (ref 4.0–5.6)

## 2018-07-05 LAB — TSH: TSH: 2.84 u[IU]/mL (ref 0.35–4.50)

## 2018-07-05 LAB — BRAIN NATRIURETIC PEPTIDE: Pro B Natriuretic peptide (BNP): 24 pg/mL (ref 0.0–100.0)

## 2018-07-05 LAB — POCT GLUCOSE (DEVICE FOR HOME USE): Glucose Fasting, POC: 220 mg/dL — AB (ref 70–99)

## 2018-07-05 LAB — TROPONIN I: TNIDX: 0 ug/l (ref 0.00–0.06)

## 2018-07-05 MED ORDER — CANAGLIFLOZIN 300 MG PO TABS: 300.0000 mg | ORAL_TABLET | Freq: Every day | ORAL | 1 refills | Status: DC

## 2018-07-05 MED ORDER — NITROGLYCERIN 0.4 MG SL SUBL: 0.4000 mg | SUBLINGUAL_TABLET | SUBLINGUAL | 2 refills | Status: DC | PRN

## 2018-07-05 NOTE — Progress Notes (Signed)
Virtual Visit via Video Note  I connected with Kristen [name] on 07/05/18 at 11:30 AM EDT by a video enabled telemedicine application and verified that I am speaking with the correct person using two identifiers.   I discussed the limitations of evaluation and management by telemedicine and the availability of in person appointments. The patient expressed understanding and agreed to proceed.  History of Present Illness: She checked in for virtual visit but was complaining of burning chest pain and shortness of breath so I have asked her to come in this afternoon to be seen in person with an EKG.    Observations/Objective:   Lab Results  Component Value Date   WBC 6.6 02/05/2016   HGB 14.7 02/05/2016   HCT 42.9 02/05/2016   PLT 323 02/05/2016   GLUCOSE 279 (H) 12/28/2017   CHOL 108 12/26/2017   TRIG 109 12/26/2017   HDL 38 (L) 12/26/2017   LDLDIRECT 116.0 11/28/2008   LDLCALC 48 12/26/2017   ALT 10 12/28/2017   AST 8 12/28/2017   NA 137 12/28/2017   K 4.1 12/28/2017   CL 103 12/28/2017   CREATININE 0.97 12/28/2017   BUN 20 12/28/2017   CO2 20 12/28/2017   TSH 1.86 12/28/2017   INR 2.8 06/29/2018   HGBA1C 8.4 (A) 12/27/2017   MICROALBUR <0.7 12/28/2017     Assessment and Plan:   Follow Up Instructions:    I discussed the assessment and treatment plan with the patient. The patient was provided an opportunity to ask questions and all were answered. The patient agreed with the plan and demonstrated an understanding of the instructions.   The patient was advised to call back or seek an in-person evaluation if the symptoms worsen or if the condition fails to improve as anticipated.  I provided 10 minutes of non-face-to-face time during this encounter.   Scarlette Calico, MD

## 2018-07-05 NOTE — Patient Instructions (Signed)
Type 2 Diabetes Mellitus, Diagnosis, Adult Type 2 diabetes (type 2 diabetes mellitus) is a long-term (chronic) disease. In type 2 diabetes, one or both of these problems may be present:  The pancreas does not make enough of a hormone called insulin.  Cells in the body do not respond properly to insulin that the body makes (insulin resistance). Normally, insulin allows blood sugar (glucose) to enter cells in the body. The cells use glucose for energy. Insulin resistance or lack of insulin causes excess glucose to build up in the blood instead of going into cells. As a result, high blood glucose (hyperglycemia) develops. What increases the risk? The following factors may make you more likely to develop type 2 diabetes:  Having a family member with type 2 diabetes.  Being overweight or obese.  Having an inactive (sedentary) lifestyle.  Having been diagnosed with insulin resistance.  Having a history of prediabetes, gestational diabetes, or polycystic ovary syndrome (PCOS).  Being of American-Indian, African-American, Hispanic/Latino, or Asian/Pacific Islander descent. What are the signs or symptoms? In the early stage of this condition, you may not have symptoms. Symptoms develop slowly and may include:  Increased thirst (polydipsia).  Increased hunger(polyphagia).  Increased urination (polyuria).  Increased urination during the night (nocturia).  Unexplained weight loss.  Frequent infections that keep coming back (recurring).  Fatigue.  Weakness.  Vision changes, such as blurry vision.  Cuts or bruises that are slow to heal.  Tingling or numbness in the hands or feet.  Dark patches on the skin (acanthosis nigricans). How is this diagnosed? This condition is diagnosed based on your symptoms, your medical history, a physical exam, and your blood glucose level. Your blood glucose may be checked with one or more of the following blood tests:  A fasting blood glucose (FBG)  test. You will not be allowed to eat (you will fast) for 8 hours or longer before a blood sample is taken.  A random blood glucose test. This test checks blood glucose at any time of day regardless of when you ate.  An A1c (hemoglobin A1c) blood test. This test provides information about blood glucose control over the previous 2-3 months.  An oral glucose tolerance test (OGTT). This test measures your blood glucose at two times: ? After fasting. This is your baseline blood glucose level. ? Two hours after drinking a beverage that contains glucose. You may be diagnosed with type 2 diabetes if:  Your FBG level is 126 mg/dL (7.0 mmol/L) or higher.  Your random blood glucose level is 200 mg/dL (11.1 mmol/L) or higher.  Your A1c level is 6.5% or higher.  Your OGTT result is higher than 200 mg/dL (11.1 mmol/L). These blood tests may be repeated to confirm your diagnosis. How is this treated? Your treatment may be managed by a specialist called an endocrinologist. Type 2 diabetes may be treated by following instructions from your health care provider about:  Making diet and lifestyle changes. This may include: ? Following an individualized nutrition plan that is developed by a diet and nutrition specialist (registered dietitian). ? Exercising regularly. ? Finding ways to manage stress.  Checking your blood glucose level as often as told.  Taking diabetes medicines or insulin daily. This helps to keep your blood glucose levels in the healthy range. ? If you use insulin, you may need to adjust the dosage depending on how physically active you are and what foods you eat. Your health care provider will tell you how to adjust your dosage.    Taking medicines to help prevent complications from diabetes, such as: ? Aspirin. ? Medicine to lower cholesterol. ? Medicine to control blood pressure. Your health care provider will set individualized treatment goals for you. Your goals will be based on  your age, other medical conditions you have, and how you respond to diabetes treatment. Generally, the goal of treatment is to maintain the following blood glucose levels:  Before meals (preprandial): 80-130 mg/dL (4.4-7.2 mmol/L).  After meals (postprandial): below 180 mg/dL (10 mmol/L).  A1c level: less than 7%. Follow these instructions at home: Questions to ask your health care provider  Consider asking the following questions: ? Do I need to meet with a diabetes educator? ? Where can I find a support group for people with diabetes? ? What equipment will I need to manage my diabetes at home? ? What diabetes medicines do I need, and when should I take them? ? How often do I need to check my blood glucose? ? What number can I call if I have questions? ? When is my next appointment? General instructions  Take over-the-counter and prescription medicines only as told by your health care provider.  Keep all follow-up visits as told by your health care provider. This is important.  For more information about diabetes, visit: ? American Diabetes Association (ADA): www.diabetes.org ? American Association of Diabetes Educators (AADE): www.diabeteseducator.org Contact a health care provider if:  Your blood glucose is at or above 240 mg/dL (13.3 mmol/L) for 2 days in a row.  You have been sick or have had a fever for 2 days or longer, and you are not getting better.  You have any of the following problems for more than 6 hours: ? You cannot eat or drink. ? You have nausea and vomiting. ? You have diarrhea. Get help right away if:  Your blood glucose is lower than 54 mg/dL (3.0 mmol/L).  You become confused or you have trouble thinking clearly.  You have difficulty breathing.  You have moderate or large ketone levels in your urine. Summary  Type 2 diabetes (type 2 diabetes mellitus) is a long-term (chronic) disease. In type 2 diabetes, the pancreas does not make enough of a  hormone called insulin, or cells in the body do not respond properly to insulin that the body makes (insulin resistance).  This condition is treated by making diet and lifestyle changes and taking diabetes medicines or insulin.  Your health care provider will set individualized treatment goals for you. Your goals will be based on your age, other medical conditions you have, and how you respond to diabetes treatment.  Keep all follow-up visits as told by your health care provider. This is important. This information is not intended to replace advice given to you by your health care provider. Make sure you discuss any questions you have with your health care provider. Document Released: 02/21/2005 Document Revised: 09/22/2016 Document Reviewed: 03/27/2015 Elsevier Interactive Patient Education  2019 [employer]  

## 2018-07-05 NOTE — Progress Notes (Signed)
Subjective:  Patient ID: Kristen [name], female    DOB: 1945-06-15  Age: 73 y.o. MRN: [id number]  CC: Diabetes   HPI Kristen [name] presents for f/up - It is difficult to get a good history out of her but it sounds like she has been experiencing new onset DOE and chest pain under her sternum that she describes as a burning sensation.  She has not used nitroglycerin or anything else to help with the symptoms.  The chest pain is not exertional.  She tells me she has been trying to contact her cardiologist but has not been able to see him.  She also complains that her blood sugars are not well controlled.  She is not taking Jardiance, the SGLT2 inhibitor, because it was too expensive.  Outpatient Medications Prior to Visit  Medication Sig Dispense Refill  . ALPRAZolam (XANAX) 1 MG tablet Take 1 tablet (1 mg total) by mouth 3 (three) times daily as needed for anxiety. 90 tablet 5  . atorvastatin (LIPITOR) 40 MG tablet TAKE 1 TABLET BY MOUTH DAILY 90 tablet 1  . Blood Glucose Monitoring Suppl (ONE TOUCH ULTRA 2) w/Device KIT Use as advised 1 each 0  . cyclobenzaprine (FLEXERIL) 10 MG tablet TK 1 T PO TID PRN    . fluticasone (FLONASE) 50 MCG/ACT nasal spray USE 2 SPRAYS IN EACH NOSTRIL DAILY 48 g 1  . furosemide (LASIX) 20 MG tablet Take 1 tablet (20 mg total) by mouth daily. 90 tablet 1  . gabapentin (NEURONTIN) 300 MG capsule TAKE ONE CAPSULE BY MOUTH THREE TIMES DAILY 90 capsule 5  . liraglutide (VICTOZA) 18 MG/3ML SOPN Inject 0.3 mLs (1.8 mg total) into the skin every morning. 27 mL 3  . MAGNESIUM-OXIDE 400 (241.3 Mg) MG tablet TAKE 1 TABLET BY MOUTH TWICE DAILY 60 tablet 5  . metFORMIN (GLUCOPHAGE-XR) 500 MG 24 hr tablet Take 2 tablets (1,000 mg total) by mouth 2 (two) times daily with a meal. 360 tablet 3  . metoprolol succinate (TOPROL-XL) 100 MG 24 hr tablet TAKE 1 TABLET BY MOUTH TWICE DAILY 180 tablet 1  . ONE TOUCH ULTRA TEST test strip USE TO TEST BLOOD SUGAR FOUR TIMES  DAILY AS DIRECTED 300 each 2  . potassium chloride SA (K-DUR,KLOR-CON) 20 MEQ tablet TAKE 1 TABLET BY MOUTH TWICE DAILY. 180 tablet 1  . spironolactone (ALDACTONE) 25 MG tablet TAKE 1 TABLET BY MOUTH TWICE DAILY 180 tablet 0  . telmisartan (MICARDIS) 40 MG tablet TAKE 1 TABLET BY MOUTH EVERY DAY 90 tablet 1  . triamcinolone cream (KENALOG) 0.5 % Apply 1 application topically 3 (three) times daily. 60 g 3  . warfarin (COUMADIN) 3 MG tablet Take 1 tablet daily or As directed by anticoagulation clinic 30 tablet 2  . glipiZIDE (GLUCOTROL) 5 MG tablet TAKE 2 TABLETS BY MOUTH TWICE DAILY BEFORE MEALS 120 tablet 3  . glipiZIDE (GLUCOTROL) 5 MG tablet Take 2 tablets 2x a day before meals 120 tablet 3  . loratadine (CLARITIN) 10 MG tablet Take 1 tablet (10 mg total) by mouth daily. 30 tablet 1  . oxyCODONE-acetaminophen (PERCOCET) 10-325 MG tablet Take 1 tablet by mouth every 4 (four) hours as needed for pain.    Marland Kitchen empagliflozin (JARDIANCE) 25 MG TABS tablet Take 25 mg by mouth daily. (Patient not taking: Reported on 07/05/2018) 30 tablet 6  . nitroGLYCERIN (NITROSTAT) 0.4 MG SL tablet Place 0.4 mg under the tongue every 5 (five) minutes as needed for chest pain (x  3 doses). Reported on 05/08/2015    . Prucalopride Succinate (MOTEGRITY) 2 MG TABS Take 1 tablet by mouth daily. (Patient not taking: Reported on 07/05/2018) 90 tablet 1   No facility-administered medications prior to visit.     ROS Review of Systems  Constitutional: Positive for unexpected weight change (wt gain). Negative for diaphoresis and fatigue.  HENT: Negative.  Negative for trouble swallowing.   Eyes: Negative for visual disturbance.  Respiratory: Positive for shortness of breath. Negative for cough, chest tightness and wheezing.   Cardiovascular: Positive for palpitations.       She complains of an elevated heart rate but denies extra, skipped, or irregular beats.  Gastrointestinal: Negative for abdominal pain, constipation,  diarrhea, nausea and vomiting.  Endocrine: Negative.  Negative for cold intolerance, heat intolerance, polydipsia, polyphagia and polyuria.  Genitourinary: Negative.  Negative for difficulty urinating.  Musculoskeletal: Negative.  Negative for arthralgias, joint swelling, myalgias and neck stiffness.  Skin: Negative.  Negative for color change.  Neurological: Negative.  Negative for dizziness, weakness and light-headedness.  Hematological: Negative for adenopathy. Does not bruise/bleed easily.  Psychiatric/Behavioral: Positive for confusion and decreased concentration. Negative for sleep disturbance and suicidal ideas. The patient is not nervous/anxious.     Objective:  BP 120/66 (BP Location: Left Arm, Patient Position: Sitting, Cuff Size: Large)   Pulse (!) 109   Temp 98 F (36.7 C) (Oral)   Ht 5' 0.5" (1.537 m)   Wt 209 lb (94.8 kg)   SpO2 95%   BMI 40.15 kg/m   BP Readings from Last 3 Encounters:  07/05/18 120/66  04/23/18 110/80  01/15/18 (!) 140/92    Wt Readings from Last 3 Encounters:  07/05/18 209 lb (94.8 kg)  04/23/18 207 lb (93.9 kg)  01/15/18 206 lb (93.4 kg)    Physical Exam Vitals signs reviewed.  Constitutional:      Appearance: She is obese. She is not ill-appearing or diaphoretic.  HENT:     Nose: Nose normal. No congestion.     Mouth/Throat:     Mouth: Mucous membranes are moist.     Pharynx: No oropharyngeal exudate.  Eyes:     General: No scleral icterus.    Conjunctiva/sclera: Conjunctivae normal.  Neck:     Musculoskeletal: Normal range of motion. No neck rigidity.  Cardiovascular:     Rate and Rhythm: Regular rhythm. Tachycardia present.     Heart sounds: No murmur. No systolic murmur. No diastolic murmur. No gallop.      Comments: EKG ----  Sinus  Tachycardia  -Left bundle branch block.   ABNORMAL   Pulmonary:     Effort: Pulmonary effort is normal. No respiratory distress.     Breath sounds: No stridor. No wheezing, rhonchi or  rales.  Abdominal:     General: Abdomen is protuberant.     Palpations: There is no hepatomegaly, splenomegaly or mass.     Tenderness: There is no abdominal tenderness. There is no guarding.  Musculoskeletal:     Right lower leg: No edema.     Left lower leg: No edema.  Lymphadenopathy:     Cervical: No cervical adenopathy.  Skin:    General: Skin is warm.     Coloration: Skin is not pale.  Neurological:     General: No focal deficit present.  Psychiatric:        Attention and Perception: She is inattentive.        Mood and Affect: Mood normal.  Speech: Speech is delayed and tangential.        Behavior: Behavior is slowed. Behavior is cooperative.        Thought Content: Thought content normal. Thought content is not paranoid. Thought content does not include homicidal or suicidal ideation.        Cognition and Memory: Cognition is impaired. Memory is impaired. She exhibits impaired recent memory and impaired remote memory.     Lab Results  Component Value Date   WBC 8.7 07/05/2018   HGB 13.1 07/05/2018   HCT 38.2 07/05/2018   PLT 386.0 Repeated and verified X2. 07/05/2018   GLUCOSE 202 (H) 07/05/2018   CHOL 108 12/26/2017   TRIG 109 12/26/2017   HDL 38 (L) 12/26/2017   LDLDIRECT 116.0 11/28/2008   LDLCALC 48 12/26/2017   ALT 10 12/28/2017   AST 8 12/28/2017   NA 138 07/05/2018   K 4.1 07/05/2018   CL 103 07/05/2018   CREATININE 0.81 07/05/2018   BUN 18 07/05/2018   CO2 21 07/05/2018   TSH 2.84 07/05/2018   INR 2.8 06/29/2018   HGBA1C 8.8 (A) 07/05/2018   MICROALBUR <0.7 12/28/2017    Mr Shoulder Left Wo Contrast  Result Date: 10/02/2017 CLINICAL DATA:  Progressive left shoulder pain. Remote history of rotator cuff surgery. EXAM: MRI OF THE LEFT SHOULDER WITHOUT CONTRAST TECHNIQUE: Multiplanar, multisequence MR imaging of the shoulder was performed. No intravenous contrast was administered. COMPARISON:  Radiograph 08/16/2017 FINDINGS: Rotator cuff: Moderate  rotator cuff tendinopathy/tendinosis but no recurrent tear is identified. There are interstitial tears but no partial thickness tear. Muscles: Mild diffuse fatty change involving the shoulder musculature. Biceps long head:  Intact Acromioclavicular Joint: Postoperative changes from prior bony decompressive surgery. No findings for bony impingement. Glenohumeral Joint: Moderate to advanced degenerative changes with moderate cartilage loss, joint space narrowing, osteophytic spurring, subchondral cystic change, small joint effusion and mild to moderate synovitis. Labrum:  Labral degenerative changes.  No gross tear. Bones: No acute bony findings. Subchondral cystic change and possible intraosseous ganglion in the humeral head. Other: Moderate subacromial/subdeltoid bursitis. Benign-appearing lipoma is noted near the axillary recess. IMPRESSION: 1. Surgical changes from prior rotator cuff repair and bony decompressive surgery. No findings for recurrent rotator cuff tear. Moderate supraspinatus tendinopathy/tendinosis. 2. Moderate to advanced glenohumeral joint degenerative changes as detailed above. There is a small joint effusion and mild to moderate synovitis. 3. Intact long head biceps tendon. 4. Advanced labral degenerative changes without obvious tear. 5. Moderate subacromial/subdeltoid bursitis. Electronically Signed   By: Marijo Sanes M.D.   On: 10/02/2017 08:32    Assessment & Plan:   Kristen [name] was seen today for diabetes.  Diagnoses and all orders for this visit:  DM (diabetes mellitus), type 2 with peripheral vascular complications ([place])- Her A1c is 8.8%.  Her blood sugars are not adequately well controlled and she is gaining weight.  I have asked her to stop taking the sulfonylurea but to stay on the other agents.  I have also asked her to add an SGLT2 inhibitor which will help lower the blood sugar, could help her lose weight, and has cardiovascular and renal benefits. -     Cancel: Hemoglobin  A1c; Future -     POCT glycosylated hemoglobin (Hb A1C) -     POCT Glucose (Device for Home Use) -     canagliflozin (INVOKANA) 300 MG TABS tablet; Take 1 tablet (300 mg total) by mouth daily before breakfast.  Hypertension associated with diabetes ([place])- Her blood  pressure is adequately well controlled. -     Basic metabolic panel; Future -     Cancel: Hemoglobin A1c; Future  Paroxysmal atrial fibrillation ([place])- She has good rate and rhythm control.  Will continue anticoagulation with Coumadin. -     TSH; Future -     Ambulatory referral to Cardiology  Acquired hypothyroidism-her TSH is in the normal range.  Thyroid replacement therapy is not indicated. -     TSH; Future  Chest pain, unspecified type- Her chest pain is not consistent with angina or pulmonary embolus.  Her EKG shows subtle changes with the new development of a left bundle branch block.  Her BNP and troponin are normal.  I have asked her to have a follow-up soon with her cardiologist. -     EKG 12-Lead -     Brain natriuretic peptide; Future -     Troponin I -; Future -     Ambulatory referral to Cardiology  DOE (dyspnea on exertion)- See above -     EKG 12-Lead -     CBC with Differential/Platelet; Future -     Brain natriuretic peptide; Future -     Troponin I -; Future -     Ambulatory referral to Cardiology  Atherosclerosis of native coronary artery of native heart without angina pectoris -     nitroGLYCERIN (NITROSTAT) 0.4 MG SL tablet; Place 1 tablet (0.4 mg total) under the tongue every 5 (five) minutes as needed for chest pain (x 3 doses). Reported on 05/08/2015 -     Ambulatory referral to Cardiology   I have discontinued [name] Dick's oxyCODONE-acetaminophen, empagliflozin, Prucalopride Succinate, loratadine, glipiZIDE, and glipiZIDE. I have also changed her nitroGLYCERIN. Additionally, I am having her start on canagliflozin. Lastly, I am having her maintain her ONE TOUCH ULTRA 2, triamcinolone  cream, metFORMIN, liraglutide, atorvastatin, gabapentin, telmisartan, fluticasone, ONE TOUCH ULTRA TEST, furosemide, potassium chloride SA, metoprolol succinate, warfarin, MAGnesium-Oxide, spironolactone, ALPRAZolam, and cyclobenzaprine.  Meds ordered this encounter  Medications  . nitroGLYCERIN (NITROSTAT) 0.4 MG SL tablet    Sig: Place 1 tablet (0.4 mg total) under the tongue every 5 (five) minutes as needed for chest pain (x 3 doses). Reported on 05/08/2015    Dispense:  35 tablet    Refill:  2  . canagliflozin (INVOKANA) 300 MG TABS tablet    Sig: Take 1 tablet (300 mg total) by mouth daily before breakfast.    Dispense:  90 tablet    Refill:  1     Follow-up: Return in about 4 months (around 11/04/2018).  Scarlette Calico, MD

## 2018-07-13 ENCOUNTER — Ambulatory Visit: Payer: Self-pay

## 2018-07-13 ENCOUNTER — Other Ambulatory Visit: Payer: Self-pay | Admitting: Internal Medicine

## 2018-07-16 ENCOUNTER — Encounter: Payer: Self-pay | Admitting: Internal Medicine

## 2018-07-17 ENCOUNTER — Other Ambulatory Visit: Payer: Self-pay | Admitting: Internal Medicine

## 2018-07-17 DIAGNOSIS — F5105 Insomnia due to other mental disorder: Secondary | ICD-10-CM | POA: Insufficient documentation

## 2018-07-17 DIAGNOSIS — F409 Phobic anxiety disorder, unspecified: Secondary | ICD-10-CM

## 2018-07-17 MED ORDER — SUVOREXANT 15 MG PO TABS: 1.0000 | ORAL_TABLET | Freq: Every evening | ORAL | 0 refills | Status: DC | PRN

## 2018-07-23 ENCOUNTER — Encounter: Payer: Self-pay | Admitting: Internal Medicine

## 2018-07-25 ENCOUNTER — Other Ambulatory Visit: Payer: Self-pay | Admitting: Internal Medicine

## 2018-07-25 DIAGNOSIS — I2510 Atherosclerotic heart disease of native coronary artery without angina pectoris: Secondary | ICD-10-CM

## 2018-07-25 DIAGNOSIS — E1151 Type 2 diabetes mellitus with diabetic peripheral angiopathy without gangrene: Secondary | ICD-10-CM

## 2018-07-25 MED ORDER — EMPAGLIFLOZIN 25 MG PO TABS: 25.0000 mg | ORAL_TABLET | Freq: Every day | ORAL | 1 refills | Status: DC

## 2018-08-02 ENCOUNTER — Other Ambulatory Visit: Payer: Self-pay

## 2018-08-02 DIAGNOSIS — M25511 Pain in right shoulder: Secondary | ICD-10-CM | POA: Diagnosis not present

## 2018-08-02 DIAGNOSIS — M25512 Pain in left shoulder: Secondary | ICD-10-CM | POA: Diagnosis not present

## 2018-08-02 DIAGNOSIS — Z79891 Long term (current) use of opiate analgesic: Secondary | ICD-10-CM | POA: Diagnosis not present

## 2018-08-02 DIAGNOSIS — G894 Chronic pain syndrome: Secondary | ICD-10-CM | POA: Diagnosis not present

## 2018-08-02 NOTE — Patient Outreach (Signed)
[place] [facility]) Care Management  08/02/2018  Kristen [name] [birth year] [id number]    3rd outreach to the patient for assessment.  No answer.  HIPAA compliant voicemail left with contact information.  Plan: RN Health Coach has made several attempts. If no response to calls and letter in ten business days Dwight will proceed with case closure.   [name] RN, BSN, [place] Direct Dial:  [phone]  Fax: [phone]

## 2018-08-03 ENCOUNTER — Ambulatory Visit: Payer: Self-pay

## 2018-08-06 ENCOUNTER — Encounter: Payer: Self-pay | Admitting: Internal Medicine

## 2018-08-06 ENCOUNTER — Other Ambulatory Visit: Payer: Self-pay

## 2018-08-06 NOTE — Patient Outreach (Signed)
[place] [facility]) Care Management  08/06/2018   Kristen [name] [birth year] [id number]  Subjective: Returned call from the patient.  Two patient identifiers obtained.  The patient denies pain or falls.  The patient states her blood sugar today was 143.  She states that she has been trying to manage the foods that she eats but it is hard since her husband does the grocery shopping. Discussed managing her carbs with the food choices available to her.  She verbalized understanding.  She states that she is taking her medications as prescribed.  She states she has started a new medication Belsomra for sleep. She states she only sleeps for a couple of hours.  Advised the patient to discuss her situation with her physician at her next appointment.  She verbalized understanding.      Current Medications:  Current Outpatient Medications  Medication Sig Dispense Refill  . ALPRAZolam (XANAX) 1 MG tablet Take 1 tablet (1 mg total) by mouth 3 (three) times daily as needed for anxiety. 90 tablet 5  . atorvastatin (LIPITOR) 40 MG tablet TAKE 1 TABLET BY MOUTH DAILY 90 tablet 1  . Blood Glucose Monitoring Suppl (ONE TOUCH ULTRA 2) w/Device KIT Use as advised 1 each 0  . cyclobenzaprine (FLEXERIL) 10 MG tablet TK 1 T PO TID PRN    . empagliflozin (JARDIANCE) 25 MG TABS tablet Take 25 mg by mouth daily. 90 tablet 1  . furosemide (LASIX) 20 MG tablet Take 1 tablet (20 mg total) by mouth daily. 90 tablet 1  . gabapentin (NEURONTIN) 300 MG capsule TAKE ONE CAPSULE BY MOUTH THREE TIMES DAILY 90 capsule 5  . liraglutide (VICTOZA) 18 MG/3ML SOPN Inject 0.3 mLs (1.8 mg total) into the skin every morning. 27 mL 3  . MAGNESIUM-OXIDE 400 (241.3 Mg) MG tablet TAKE 1 TABLET BY MOUTH TWICE DAILY 60 tablet 5  . metFORMIN (GLUCOPHAGE-XR) 500 MG 24 hr tablet Take 2 tablets (1,000 mg total) by mouth 2 (two) times daily with a meal. 360 tablet 3  . metoprolol succinate (TOPROL-XL) 100 MG 24 hr tablet TAKE 1  TABLET BY MOUTH TWICE DAILY 180 tablet 1  . nitroGLYCERIN (NITROSTAT) 0.4 MG SL tablet Place 1 tablet (0.4 mg total) under the tongue every 5 (five) minutes as needed for chest pain (x 3 doses). Reported on 05/08/2015 35 tablet 2  . ONE TOUCH ULTRA TEST test strip USE TO TEST BLOOD SUGAR FOUR TIMES DAILY AS DIRECTED 300 each 2  . potassium chloride SA (K-DUR,KLOR-CON) 20 MEQ tablet TAKE 1 TABLET BY MOUTH TWICE DAILY. 180 tablet 1  . spironolactone (ALDACTONE) 25 MG tablet TAKE 1 TABLET BY MOUTH TWICE DAILY 180 tablet 0  . Suvorexant (BELSOMRA) 15 MG TABS Take 1 tablet by mouth at bedtime as needed. 90 tablet 0  . telmisartan (MICARDIS) 40 MG tablet TAKE 1 TABLET BY MOUTH EVERY DAY 90 tablet 1  . triamcinolone cream (KENALOG) 0.5 % Apply 1 application topically 3 (three) times daily. 60 g 3  . warfarin (COUMADIN) 3 MG tablet Take 1 tablet daily or As directed by anticoagulation clinic 30 tablet 2  . fluticasone (FLONASE) 50 MCG/ACT nasal spray USE 2 SPRAYS IN EACH NOSTRIL DAILY 48 g 1   No current facility-administered medications for this visit.     Functional Status:  In your present state of health, do you have any difficulty performing the following activities: 02/16/2018 12/29/2017  Hearing? N N  Vision? N N  Difficulty concentrating or making decisions?  N Y  Walking or climbing stairs? Y N  Dressing or bathing? N N  Doing errands, shopping? Y Y  Some recent data might be hidden    Fall/Depression Screening: Fall Risk  08/06/2018 05/01/2018 02/16/2018  Falls in the past year? 0 0 0  Risk for fall due to : - - -   PHQ 2/9 Scores 07/05/2018 02/16/2018 01/02/2018 12/29/2017 08/07/2017 10/12/2016 09/01/2015  PHQ - 2 Score 1 1 0 0 0 1 0  PHQ- 9 Score 10 - - - 2 5 -    Assessment: Patient will continue to benefit from health coach outreach for disease management and support. [facility] CM Care Plan Problem One     Most Recent Value  [facility] Long Term Goal   In 90 days the patient will verbalize that  her a1c has lowered 1-2 points  [facility] Long Term Goal Start Date  08/06/18  Interventions for Problem One Long Term Goal  Reviewed diet and healthy food choices,talked about portion sizes, encouraged medication adherence       Plan: RN Health Coach will contact patient in the month of September and patient agrees to next outreach.   [name] RN, BSN, [place] Direct Dial:  [phone]  Fax: [phone]

## 2018-08-07 ENCOUNTER — Ambulatory Visit: Payer: Medicare Other

## 2018-08-10 ENCOUNTER — Other Ambulatory Visit: Payer: Self-pay | Admitting: Internal Medicine

## 2018-08-13 ENCOUNTER — Encounter: Payer: Self-pay | Admitting: Internal Medicine

## 2018-08-14 ENCOUNTER — Other Ambulatory Visit: Payer: Self-pay

## 2018-08-14 ENCOUNTER — Other Ambulatory Visit: Payer: Medicare Other

## 2018-08-14 ENCOUNTER — Ambulatory Visit (INDEPENDENT_AMBULATORY_CARE_PROVIDER_SITE_OTHER): Payer: Medicare Other | Admitting: General Practice

## 2018-08-14 ENCOUNTER — Ambulatory Visit (INDEPENDENT_AMBULATORY_CARE_PROVIDER_SITE_OTHER): Payer: Medicare Other | Admitting: Internal Medicine

## 2018-08-14 ENCOUNTER — Other Ambulatory Visit (INDEPENDENT_AMBULATORY_CARE_PROVIDER_SITE_OTHER): Payer: Medicare Other

## 2018-08-14 ENCOUNTER — Encounter: Payer: Self-pay | Admitting: Internal Medicine

## 2018-08-14 VITALS — BP 138/74 | HR 78 | Temp 98.1°F | Ht 60.5 in | Wt 211.0 lb

## 2018-08-14 DIAGNOSIS — R350 Frequency of micturition: Secondary | ICD-10-CM

## 2018-08-14 DIAGNOSIS — E1151 Type 2 diabetes mellitus with diabetic peripheral angiopathy without gangrene: Secondary | ICD-10-CM | POA: Diagnosis not present

## 2018-08-14 DIAGNOSIS — Z7901 Long term (current) use of anticoagulants: Secondary | ICD-10-CM | POA: Diagnosis not present

## 2018-08-14 DIAGNOSIS — I480 Paroxysmal atrial fibrillation: Secondary | ICD-10-CM

## 2018-08-14 LAB — POCT GLUCOSE (DEVICE FOR HOME USE): Glucose Fasting, POC: 258 mg/dL — AB (ref 70–99)

## 2018-08-14 LAB — POC URINALSYSI DIPSTICK (AUTOMATED)

## 2018-08-14 LAB — POCT URINALYSIS DIPSTICK
Bilirubin, UA: NEGATIVE
Glucose, UA: POSITIVE — AB
Ketones, UA: NEGATIVE
Leukocytes, UA: NEGATIVE
Nitrite, UA: NEGATIVE
Protein, UA: NEGATIVE
Spec Grav, UA: 1.015 (ref 1.010–1.025)
Urobilinogen, UA: NEGATIVE E.U./dL — AB
pH, UA: 6.5 (ref 5.0–8.0)

## 2018-08-14 LAB — POCT INR: INR: 2.4 (ref 2.0–3.0)

## 2018-08-14 MED ORDER — INSULIN PEN NEEDLE 32G X 6 MM MISC: 1.0000 | Freq: Every day | 3 refills | Status: DC

## 2018-08-14 MED ORDER — INSULIN GLARGINE (2 UNIT DIAL) 300 UNIT/ML ~~LOC~~ SOPN: 30.0000 [IU] | PEN_INJECTOR | Freq: Every day | SUBCUTANEOUS | 1 refills | Status: DC

## 2018-08-14 NOTE — Progress Notes (Signed)
Subjective:  Patient ID: Kristen [name], female    DOB: May 23, 1945  Age: 73 y.o. MRN: [id number]  CC: Diabetes   HPI [name] presents for f/up - She complains of a 2-week history of urinary urgency and frequency.  She also admits that she has been consuming too many calories and has gained weight.  Outpatient Medications Prior to Visit  Medication Sig Dispense Refill  . ALPRAZolam (XANAX) 1 MG tablet Take 1 tablet (1 mg total) by mouth 3 (three) times daily as needed for anxiety. 90 tablet 5  . atorvastatin (LIPITOR) 40 MG tablet TAKE 1 TABLET BY MOUTH DAILY 90 tablet 1  . Blood Glucose Monitoring Suppl (ONE TOUCH ULTRA 2) w/Device KIT Use as advised 1 each 0  . cyclobenzaprine (FLEXERIL) 10 MG tablet TK 1 T PO TID PRN    . empagliflozin (JARDIANCE) 25 MG TABS tablet Take 25 mg by mouth daily. 90 tablet 1  . furosemide (LASIX) 20 MG tablet Take 1 tablet (20 mg total) by mouth daily. 90 tablet 1  . gabapentin (NEURONTIN) 300 MG capsule TAKE ONE CAPSULE BY MOUTH THREE TIMES DAILY 90 capsule 5  . liraglutide (VICTOZA) 18 MG/3ML SOPN Inject 0.3 mLs (1.8 mg total) into the skin every morning. 27 mL 3  . MAGNESIUM-OXIDE 400 (241.3 Mg) MG tablet TAKE 1 TABLET BY MOUTH TWICE DAILY 60 tablet 5  . metFORMIN (GLUCOPHAGE-XR) 500 MG 24 hr tablet Take 2 tablets (1,000 mg total) by mouth 2 (two) times daily with a meal. 360 tablet 3  . metoprolol succinate (TOPROL-XL) 100 MG 24 hr tablet TAKE 1 TABLET BY MOUTH TWICE DAILY 180 tablet 1  . nitroGLYCERIN (NITROSTAT) 0.4 MG SL tablet Place 1 tablet (0.4 mg total) under the tongue every 5 (five) minutes as needed for chest pain (x 3 doses). Reported on 05/08/2015 35 tablet 2  . ONE TOUCH ULTRA TEST test strip USE TO TEST BLOOD SUGAR FOUR TIMES DAILY AS DIRECTED 300 each 2  . potassium chloride SA (K-DUR,KLOR-CON) 20 MEQ tablet TAKE 1 TABLET BY MOUTH TWICE DAILY. 180 tablet 1  . spironolactone (ALDACTONE) 25 MG tablet TAKE 1 TABLET BY MOUTH  TWICE DAILY 180 tablet 0  . Suvorexant (BELSOMRA) 15 MG TABS Take 1 tablet by mouth at bedtime as needed. 90 tablet 0  . telmisartan (MICARDIS) 40 MG tablet TAKE 1 TABLET BY MOUTH EVERY DAY 90 tablet 1  . warfarin (COUMADIN) 3 MG tablet Take 1 tablet daily or As directed by anticoagulation clinic 30 tablet 2  . fluticasone (FLONASE) 50 MCG/ACT nasal spray USE 2 SPRAYS IN EACH NOSTRIL DAILY 48 g 1  . triamcinolone cream (KENALOG) 0.5 % Apply 1 application topically 3 (three) times daily. 60 g 3   No facility-administered medications prior to visit.     ROS Review of Systems  Constitutional: Positive for unexpected weight change (wt gain). Negative for diaphoresis and fatigue.  Eyes: Negative for visual disturbance.  Respiratory: Negative for cough, chest tightness, shortness of breath and wheezing.   Cardiovascular: Negative for chest pain, palpitations and leg swelling.  Gastrointestinal: Negative for abdominal pain, diarrhea, nausea and vomiting.  Endocrine: Positive for polydipsia and polyuria. Negative for polyphagia.  Genitourinary: Positive for frequency and urgency. Negative for decreased urine volume, difficulty urinating, dysuria and hematuria.  Musculoskeletal: Negative.  Negative for arthralgias and myalgias.  Skin: Negative.  Negative for color change and pallor.  Neurological: Negative.  Negative for weakness and light-headedness.  Hematological: Negative for adenopathy.  Does not bruise/bleed easily.  Psychiatric/Behavioral: Negative.     Objective:  BP 138/74 (BP Location: Left Arm, Patient Position: Sitting, Cuff Size: Large)   Pulse 78   Temp 98.1 F (36.7 C) (Oral)   Ht 5' 0.5" (1.537 m)   Wt 211 lb (95.7 kg)   SpO2 98%   BMI 40.53 kg/m   BP Readings from Last 3 Encounters:  08/14/18 138/74  07/05/18 120/66  04/23/18 110/80    Wt Readings from Last 3 Encounters:  08/14/18 211 lb (95.7 kg)  07/05/18 209 lb (94.8 kg)  04/23/18 207 lb (93.9 kg)     Physical Exam Constitutional:      General: She is not in acute distress.    Appearance: She is obese. She is not ill-appearing, toxic-appearing or diaphoretic.  HENT:     Nose: Nose normal.     Mouth/Throat:     Mouth: Mucous membranes are moist.     Pharynx: No oropharyngeal exudate.  Eyes:     General: No scleral icterus.    Conjunctiva/sclera: Conjunctivae normal.  Neck:     Musculoskeletal: Normal range of motion. No neck rigidity.  Cardiovascular:     Rate and Rhythm: Normal rate and regular rhythm.     Pulses: Normal pulses.     Heart sounds: No murmur.  Pulmonary:     Effort: Pulmonary effort is normal.     Breath sounds: No stridor. No wheezing, rhonchi or rales.  Abdominal:     General: Abdomen is protuberant.     Palpations: There is no hepatomegaly or splenomegaly.     Tenderness: There is no abdominal tenderness.  Musculoskeletal: Normal range of motion.     Right lower leg: No edema.     Left lower leg: No edema.  Skin:    General: Skin is warm and dry.  Neurological:     General: No focal deficit present.     Mental Status: She is oriented to person, place, and time. Mental status is at baseline.  Psychiatric:        Mood and Affect: Mood normal.        Behavior: Behavior normal.     Lab Results  Component Value Date   WBC 8.7 07/05/2018   HGB 13.1 07/05/2018   HCT 38.2 07/05/2018   PLT 386.0 Repeated and verified X2. 07/05/2018   GLUCOSE 202 (H) 07/05/2018   CHOL 108 12/26/2017   TRIG 109 12/26/2017   HDL 38 (L) 12/26/2017   LDLDIRECT 116.0 11/28/2008   LDLCALC 48 12/26/2017   ALT 10 12/28/2017   AST 8 12/28/2017   NA 138 07/05/2018   K 4.1 07/05/2018   CL 103 07/05/2018   CREATININE 0.81 07/05/2018   BUN 18 07/05/2018   CO2 21 07/05/2018   TSH 2.84 07/05/2018   INR 2.4 08/14/2018   HGBA1C 8.8 (A) 07/05/2018   MICROALBUR <0.7 12/28/2017    Results for orders placed or performed in visit on 08/14/18 (from the past 24 hour(s))  POCT  Urinalysis Dipstick     Status: Abnormal   Collection Time: 08/14/18  4:01 PM  Result Value Ref Range   Color, UA yellow    Clarity, UA clear    Glucose, UA Positive (A) Negative   Bilirubin, UA neg    Ketones, UA neg    Spec Grav, UA 1.015 1.010 - 1.025   Blood, UA trace    pH, UA 6.5 5.0 - 8.0   Protein, UA Negative Negative  Urobilinogen, UA negative (A) 0.2 or 1.0 E.U./dL   Nitrite, UA neg    Leukocytes, UA Negative Negative   Appearance clear    Odor     *Note: Due to a large number of results and/or encounters for the requested time period, some results have not been displayed. A complete set of results can be found in Results Review.    Mr Shoulder Left Wo Contrast  Result Date: 10/02/2017 CLINICAL DATA:  Progressive left shoulder pain. Remote history of rotator cuff surgery. EXAM: MRI OF THE LEFT SHOULDER WITHOUT CONTRAST TECHNIQUE: Multiplanar, multisequence MR imaging of the shoulder was performed. No intravenous contrast was administered. COMPARISON:  Radiograph 08/16/2017 FINDINGS: Rotator cuff: Moderate rotator cuff tendinopathy/tendinosis but no recurrent tear is identified. There are interstitial tears but no partial thickness tear. Muscles: Mild diffuse fatty change involving the shoulder musculature. Biceps long head:  Intact Acromioclavicular Joint: Postoperative changes from prior bony decompressive surgery. No findings for bony impingement. Glenohumeral Joint: Moderate to advanced degenerative changes with moderate cartilage loss, joint space narrowing, osteophytic spurring, subchondral cystic change, small joint effusion and mild to moderate synovitis. Labrum:  Labral degenerative changes.  No gross tear. Bones: No acute bony findings. Subchondral cystic change and possible intraosseous ganglion in the humeral head. Other: Moderate subacromial/subdeltoid bursitis. Benign-appearing lipoma is noted near the axillary recess. IMPRESSION: 1. Surgical changes from prior rotator  cuff repair and bony decompressive surgery. No findings for recurrent rotator cuff tear. Moderate supraspinatus tendinopathy/tendinosis. 2. Moderate to advanced glenohumeral joint degenerative changes as detailed above. There is a small joint effusion and mild to moderate synovitis. 3. Intact long head biceps tendon. 4. Advanced labral degenerative changes without obvious tear. 5. Moderate subacromial/subdeltoid bursitis. Electronically Signed   By: Marijo Sanes M.D.   On: 10/02/2017 08:32    Assessment & Plan:   Zarina was seen today for diabetes.  Diagnoses and all orders for this visit:  Urinary frequency- Her urinary symptoms are consistent with glucosuria.  There is no evidence of infection on the UA.  If the urine culture is positive then I will recommend an antibiotic. -     CULTURE, URINE COMPREHENSIVE; Future -     POCT Urinalysis Dipstick  DM (diabetes mellitus), type 2 with peripheral vascular complications ([place])- Her blood sugar is not well controlled and she is symptomatic with signs of glucose toxicity.  I have asked her to add a basal insulin to her current regimen to try to get better glycemic control. -     Amb Referral to Nutrition and Diabetic E -     Consult to [facility] -     HM Diabetes Foot Exam -     Basic metabolic panel; Future -     C-peptide; Future -     Insulin Glargine, 2 Unit Dial, (TOUJEO MAX SOLOSTAR) 300 UNIT/ML SOPN; Inject 30 Units into the skin daily. -     Insulin Pen Needle (NOVOFINE) 32G X 6 MM MISC; 1 Act by Does not apply route daily.   I have discontinued Cam Harnden Dick's triamcinolone cream and fluticasone. I am also having her start on Insulin Glargine (2 Unit Dial) and Insulin Pen Needle. Additionally, I am having her maintain her ONE TOUCH ULTRA 2, metFORMIN, liraglutide, telmisartan, ONE TOUCH ULTRA TEST, furosemide, potassium chloride SA, metoprolol succinate, warfarin, MAGnesium-Oxide, spironolactone, ALPRAZolam,  cyclobenzaprine, nitroGLYCERIN, atorvastatin, Suvorexant, empagliflozin, and gabapentin.  Meds ordered this encounter  Medications  . Insulin Glargine, 2 Unit Dial, (TOUJEO MAX  SOLOSTAR) 300 UNIT/ML SOPN    Sig: Inject 30 Units into the skin daily.    Dispense:  9 mL    Refill:  1  . Insulin Pen Needle (NOVOFINE) 32G X 6 MM MISC    Sig: 1 Act by Does not apply route daily.    Dispense:  100 each    Refill:  3     Follow-up: No follow-ups on file.  Scarlette Calico, MD

## 2018-08-14 NOTE — Patient Instructions (Signed)
Type 2 Diabetes Mellitus, Diagnosis, Adult Type 2 diabetes (type 2 diabetes mellitus) is a long-term (chronic) disease. In type 2 diabetes, one or both of these problems may be present:  The pancreas does not make enough of a hormone called insulin.  Cells in the body do not respond properly to insulin that the body makes (insulin resistance). Normally, insulin allows blood sugar (glucose) to enter cells in the body. The cells use glucose for energy. Insulin resistance or lack of insulin causes excess glucose to build up in the blood instead of going into cells. As a result, high blood glucose (hyperglycemia) develops. What increases the risk? The following factors may make you more likely to develop type 2 diabetes:  Having a family member with type 2 diabetes.  Being overweight or obese.  Having an inactive (sedentary) lifestyle.  Having been diagnosed with insulin resistance.  Having a history of prediabetes, gestational diabetes, or polycystic ovary syndrome (PCOS).  Being of American-Indian, African-American, Hispanic/Latino, or Asian/Pacific Islander descent. What are the signs or symptoms? In the early stage of this condition, you may not have symptoms. Symptoms develop slowly and may include:  Increased thirst (polydipsia).  Increased hunger(polyphagia).  Increased urination (polyuria).  Increased urination during the night (nocturia).  Unexplained weight loss.  Frequent infections that keep coming back (recurring).  Fatigue.  Weakness.  Vision changes, such as blurry vision.  Cuts or bruises that are slow to heal.  Tingling or numbness in the hands or feet.  Dark patches on the skin (acanthosis nigricans). How is this diagnosed? This condition is diagnosed based on your symptoms, your medical history, a physical exam, and your blood glucose level. Your blood glucose may be checked with one or more of the following blood tests:  A fasting blood glucose (FBG)  test. You will not be allowed to eat (you will fast) for 8 hours or longer before a blood sample is taken.  A random blood glucose test. This test checks blood glucose at any time of day regardless of when you ate.  An A1c (hemoglobin A1c) blood test. This test provides information about blood glucose control over the previous 2-3 months.  An oral glucose tolerance test (OGTT). This test measures your blood glucose at two times: ? After fasting. This is your baseline blood glucose level. ? Two hours after drinking a beverage that contains glucose. You may be diagnosed with type 2 diabetes if:  Your FBG level is 126 mg/dL (7.0 mmol/L) or higher.  Your random blood glucose level is 200 mg/dL (11.1 mmol/L) or higher.  Your A1c level is 6.5% or higher.  Your OGTT result is higher than 200 mg/dL (11.1 mmol/L). These blood tests may be repeated to confirm your diagnosis. How is this treated? Your treatment may be managed by a specialist called an endocrinologist. Type 2 diabetes may be treated by following instructions from your health care provider about:  Making diet and lifestyle changes. This may include: ? Following an individualized nutrition plan that is developed by a diet and nutrition specialist (registered dietitian). ? Exercising regularly. ? Finding ways to manage stress.  Checking your blood glucose level as often as told.  Taking diabetes medicines or insulin daily. This helps to keep your blood glucose levels in the healthy range. ? If you use insulin, you may need to adjust the dosage depending on how physically active you are and what foods you eat. Your health care provider will tell you how to adjust your dosage.    Taking medicines to help prevent complications from diabetes, such as: ? Aspirin. ? Medicine to lower cholesterol. ? Medicine to control blood pressure. Your health care provider will set individualized treatment goals for you. Your goals will be based on  your age, other medical conditions you have, and how you respond to diabetes treatment. Generally, the goal of treatment is to maintain the following blood glucose levels:  Before meals (preprandial): 80-130 mg/dL (4.4-7.2 mmol/L).  After meals (postprandial): below 180 mg/dL (10 mmol/L).  A1c level: less than 7%. Follow these instructions at home: Questions to ask your health care provider  Consider asking the following questions: ? Do I need to meet with a diabetes educator? ? Where can I find a support group for people with diabetes? ? What equipment will I need to manage my diabetes at home? ? What diabetes medicines do I need, and when should I take them? ? How often do I need to check my blood glucose? ? What number can I call if I have questions? ? When is my next appointment? General instructions  Take over-the-counter and prescription medicines only as told by your health care provider.  Keep all follow-up visits as told by your health care provider. This is important.  For more information about diabetes, visit: ? American Diabetes Association (ADA): www.diabetes.org ? American Association of Diabetes Educators (AADE): www.diabeteseducator.org Contact a health care provider if:  Your blood glucose is at or above 240 mg/dL (13.3 mmol/L) for 2 days in a row.  You have been sick or have had a fever for 2 days or longer, and you are not getting better.  You have any of the following problems for more than 6 hours: ? You cannot eat or drink. ? You have nausea and vomiting. ? You have diarrhea. Get help right away if:  Your blood glucose is lower than 54 mg/dL (3.0 mmol/L).  You become confused or you have trouble thinking clearly.  You have difficulty breathing.  You have moderate or large ketone levels in your urine. Summary  Type 2 diabetes (type 2 diabetes mellitus) is a long-term (chronic) disease. In type 2 diabetes, the pancreas does not make enough of a  hormone called insulin, or cells in the body do not respond properly to insulin that the body makes (insulin resistance).  This condition is treated by making diet and lifestyle changes and taking diabetes medicines or insulin.  Your health care provider will set individualized treatment goals for you. Your goals will be based on your age, other medical conditions you have, and how you respond to diabetes treatment.  Keep all follow-up visits as told by your health care provider. This is important. This information is not intended to replace advice given to you by your health care provider. Make sure you discuss any questions you have with your health care provider. Document Released: 02/21/2005 Document Revised: 09/22/2016 Document Reviewed: 03/27/2015 Elsevier Interactive Patient Education  2019 [employer]  

## 2018-08-14 NOTE — Patient Instructions (Addendum)
Pre visit review using our clinic review tool, if applicable. No additional management support is needed unless otherwise documented below in the visit note.  Continue to take 1/2 tablet all days except 1 tablet on Monday/Fridays.  Re-check in 4 to 5 weeks.

## 2018-08-14 NOTE — Telephone Encounter (Signed)
Appointment has been made with Dr.John.  °

## 2018-08-15 ENCOUNTER — Encounter: Payer: Self-pay | Admitting: Internal Medicine

## 2018-08-15 ENCOUNTER — Other Ambulatory Visit: Payer: Self-pay

## 2018-08-15 LAB — BASIC METABOLIC PANEL
BUN: 19 mg/dL (ref 6–23)
CO2: 19 mEq/L (ref 19–32)
Calcium: 9.6 mg/dL (ref 8.4–10.5)
Chloride: 105 mEq/L (ref 96–112)
Creatinine, Ser: 0.96 mg/dL (ref 0.40–1.20)
GFR: 69.4 mL/min (ref >60.00)
Glucose, Bld: 239 mg/dL — ABNORMAL HIGH (ref 70–99)
Potassium: 4.2 mEq/L (ref 3.5–5.1)
Sodium: 140 mEq/L (ref 135–145)

## 2018-08-15 LAB — C-PEPTIDE: C-Peptide: 4.49 ng/mL — ABNORMAL HIGH (ref 0.80–3.85)

## 2018-08-15 NOTE — Patient Outreach (Signed)
[facility]) Care Management  08/15/2018  Kristen [name] 15-Nov-1945 [id number]    Telephone Screen Referral Date : 08/14/18 Referral Source:MD referral Referral Reason: elevated blood sugars Insurance:  Medicare   Outreach attempt # 1 to patient. Two patient identifiers.  Spoke with the patient and informed her of the reason for the call.  The patient states that when she went for her appointment she was having urinary frequency.  She realized that she has been drinking coca cola for a few days and she forgot to take her Victoza and jardiance that morning. Discussed in depth about taking her meds, and healthy choices for her food and fluid options.  I also advised that it would be helpful for her to write her blood sugars down to help her pick up any trends.  I referred back to a handout "All about the carbs" that I mailed to her that talks about portion sizes and carbohydrates in food.  She verbalized understanding.   Plan: RN Health Coach will outreach the patient at our next scheduled interval.   [name] RN, BSN, [place]:  [phone] Fax: [phone]

## 2018-08-16 LAB — CULTURE, URINE COMPREHENSIVE
MICRO NUMBER:: 551278
SPECIMEN QUALITY:: ADEQUATE

## 2018-08-18 ENCOUNTER — Encounter: Payer: Self-pay | Admitting: Internal Medicine

## 2018-08-24 ENCOUNTER — Other Ambulatory Visit: Payer: Self-pay | Admitting: Internal Medicine

## 2018-08-24 ENCOUNTER — Encounter: Payer: Self-pay | Admitting: Internal Medicine

## 2018-08-24 DIAGNOSIS — E1151 Type 2 diabetes mellitus with diabetic peripheral angiopathy without gangrene: Secondary | ICD-10-CM

## 2018-08-24 MED ORDER — VICTOZA 18 MG/3ML ~~LOC~~ SOPN: 1.8000 mg | PEN_INJECTOR | Freq: Every morning | SUBCUTANEOUS | 3 refills | Status: DC

## 2018-08-30 DIAGNOSIS — M25511 Pain in right shoulder: Secondary | ICD-10-CM | POA: Diagnosis not present

## 2018-08-30 DIAGNOSIS — Z79891 Long term (current) use of opiate analgesic: Secondary | ICD-10-CM | POA: Diagnosis not present

## 2018-08-30 DIAGNOSIS — M25512 Pain in left shoulder: Secondary | ICD-10-CM | POA: Diagnosis not present

## 2018-08-30 DIAGNOSIS — G894 Chronic pain syndrome: Secondary | ICD-10-CM | POA: Diagnosis not present

## 2018-08-31 ENCOUNTER — Encounter: Payer: Self-pay | Admitting: Internal Medicine

## 2018-08-31 MED ORDER — GLIPIZIDE 5 MG PO TABS: 5.0000 mg | ORAL_TABLET | Freq: Two times a day (BID) | ORAL | 0 refills | Status: DC

## 2018-08-31 NOTE — Telephone Encounter (Signed)
During LOV with PCP pt stated that she was not taking the glipizide. In my chart message, pt requested the medication to be added back stating that she has been taking it.

## 2018-09-03 ENCOUNTER — Other Ambulatory Visit: Payer: Self-pay | Admitting: Internal Medicine

## 2018-09-03 DIAGNOSIS — Z7901 Long term (current) use of anticoagulants: Secondary | ICD-10-CM

## 2018-09-03 MED ORDER — WARFARIN SODIUM 3 MG PO TABS: ORAL_TABLET | ORAL | 2 refills | Status: DC

## 2018-09-10 ENCOUNTER — Other Ambulatory Visit: Payer: Self-pay | Admitting: Internal Medicine

## 2018-09-10 DIAGNOSIS — I2510 Atherosclerotic heart disease of native coronary artery without angina pectoris: Secondary | ICD-10-CM

## 2018-09-10 DIAGNOSIS — E1159 Type 2 diabetes mellitus with other circulatory complications: Secondary | ICD-10-CM

## 2018-09-10 DIAGNOSIS — E1151 Type 2 diabetes mellitus with diabetic peripheral angiopathy without gangrene: Secondary | ICD-10-CM

## 2018-09-10 MED ORDER — TELMISARTAN 40 MG PO TABS: 40.0000 mg | ORAL_TABLET | Freq: Every day | ORAL | 1 refills | Status: DC

## 2018-09-17 ENCOUNTER — Other Ambulatory Visit: Payer: Self-pay | Admitting: Internal Medicine

## 2018-09-25 ENCOUNTER — Ambulatory Visit: Payer: Self-pay | Admitting: Internal Medicine

## 2018-09-25 DIAGNOSIS — M25511 Pain in right shoulder: Secondary | ICD-10-CM | POA: Diagnosis not present

## 2018-09-25 DIAGNOSIS — G894 Chronic pain syndrome: Secondary | ICD-10-CM | POA: Diagnosis not present

## 2018-09-25 DIAGNOSIS — M25512 Pain in left shoulder: Secondary | ICD-10-CM | POA: Diagnosis not present

## 2018-09-25 DIAGNOSIS — Z79891 Long term (current) use of opiate analgesic: Secondary | ICD-10-CM | POA: Diagnosis not present

## 2018-09-28 ENCOUNTER — Ambulatory Visit (INDEPENDENT_AMBULATORY_CARE_PROVIDER_SITE_OTHER): Payer: Medicare Other | Admitting: General Practice

## 2018-09-28 ENCOUNTER — Other Ambulatory Visit: Payer: Self-pay

## 2018-09-28 DIAGNOSIS — Z7901 Long term (current) use of anticoagulants: Secondary | ICD-10-CM

## 2018-09-28 DIAGNOSIS — I480 Paroxysmal atrial fibrillation: Secondary | ICD-10-CM

## 2018-09-28 LAB — POCT INR: INR: 2 (ref 2.0–3.0)

## 2018-09-28 NOTE — Progress Notes (Signed)
[id number]

## 2018-09-28 NOTE — Patient Instructions (Addendum)
Pre visit review using our clinic review tool, if applicable. No additional management support is needed unless otherwise documented below in the visit note.  Continue to take 1/2 tablet all days except 1 tablet on Monday/Fridays.  Re-check in 4 to 5 weeks.

## 2018-10-15 ENCOUNTER — Encounter: Payer: Self-pay | Admitting: Internal Medicine

## 2018-10-17 ENCOUNTER — Encounter: Payer: Self-pay | Admitting: Internal Medicine

## 2018-10-22 ENCOUNTER — Encounter: Payer: Self-pay | Admitting: Cardiovascular Disease

## 2018-10-22 ENCOUNTER — Ambulatory Visit (INDEPENDENT_AMBULATORY_CARE_PROVIDER_SITE_OTHER): Payer: Medicare Other | Admitting: Cardiovascular Disease

## 2018-10-22 ENCOUNTER — Other Ambulatory Visit: Payer: Self-pay

## 2018-10-22 VITALS — BP 130/80 | HR 89 | Ht 60.5 in | Wt 206.6 lb

## 2018-10-22 DIAGNOSIS — I480 Paroxysmal atrial fibrillation: Secondary | ICD-10-CM | POA: Diagnosis not present

## 2018-10-22 DIAGNOSIS — I10 Essential (primary) hypertension: Secondary | ICD-10-CM | POA: Diagnosis not present

## 2018-10-22 DIAGNOSIS — I2510 Atherosclerotic heart disease of native coronary artery without angina pectoris: Secondary | ICD-10-CM

## 2018-10-22 DIAGNOSIS — E782 Mixed hyperlipidemia: Secondary | ICD-10-CM

## 2018-10-22 NOTE — Progress Notes (Signed)
Cardiology Office Note:    Date:  10/27/2018   ID:  Jamae, Tison [birth year], MRN [id number]  PCP:  [name], MD  Cardiologist:  Sherren Mocha, MD  Electrophysiologist:  None   Referring MD: [name], MD   Chief Complaint  Patient presents with  . Coronary Artery Disease    History of Present Illness:    Kristen [name] is a 73 y.o. female with a hx of coronary artery disease status post CABG in 2004.  Other medical problems include paroxysmal atrial fibrillation on warfarin, history of stroke, obesity, diabetes, hypertension, and left bundle branch block.  She is here alone today. She hasn't been out at all during the Covid-19 pandemic, now home for almost 6 months. States she doesn't go to the grocery store, get her hair done, or run errands. She only has been to medical appointments. She is feeling depressed because she hasn't been doing much and she hasn't seen her family members much at all.   From a cardiac perspective, she is doing ok. Today, she denies symptoms of palpitations, chest pain, shortness of breath, orthopnea, PND, lower extremity edema, dizziness, or syncope. She's had increasing anxiety especially at nighttime.   Past Medical History:  Diagnosis Date  . Arthritis   . Blood transfusion without reported diagnosis   . Bursitis   . CAD (coronary artery disease)    a. s/p CABG 2004.b. stable cath 2014 demonstrating stable CAD and continued patency of her LIMA graft.  . Chronic anticoagulation    on coumadin  . DDD (degenerative disc disease), lumbar   . Depression   . Diabetes mellitus   . Diastolic dysfunction    per echo in October 2012 with EF 50 to 55%  . Fibromyalgia   . GERD (gastroesophageal reflux disease) 10/23/2003  . Headache(784.0)   . Hyperlipidemia   . Hypertension   . Hypokalemia   . LBBB (left bundle branch block)   . Lumbar back pain   . LV dysfunction    a. EF 45% by cath 2014. b. EF 50-55% by  technically difficult echo in 08/2014.  Marland Kitchen Lymphadenitis   . Morbid obesity ([place])    a. Sleep study negative for significant OSA in 11/2014.  Marland Kitchen PAF (paroxysmal atrial fibrillation) ([place])   . Stroke [facility] 2004   affected speech per pt    Past Surgical History:  Procedure Laterality Date  . ABDOMINAL HYSTERECTOMY    . ANGIOPLASTY  laminectomy  . CHOLECYSTECTOMY    . CORONARY ARTERY BYPASS GRAFT     LIMA to LAD   . KNEE ARTHROSCOPY    . LEFT HEART CATHETERIZATION WITH CORONARY/GRAFT ANGIOGRAM  12/07/2012   Procedure: LEFT HEART CATHETERIZATION WITH Beatrix Fetters;  Surgeon: Blane Ohara, MD;  Location: [facility] CATH LAB;  Service: Cardiovascular;;  . LUMBAR LAMINECTOMY     x3  . SPHINCTEROTOMY    . TONSILLECTOMY      Current Medications: Current Meds  Medication Sig  . atorvastatin (LIPITOR) 40 MG tablet TAKE 1 TABLET BY MOUTH DAILY  . Blood Glucose Monitoring Suppl (ONE TOUCH ULTRA 2) w/Device KIT Use as advised  . cyclobenzaprine (FLEXERIL) 10 MG tablet TK 1 T PO TID PRN  . empagliflozin (JARDIANCE) 25 MG TABS tablet Take 25 mg by mouth daily.  . furosemide (LASIX) 20 MG tablet Take 1 tablet (20 mg total) by mouth daily.  Marland Kitchen gabapentin (NEURONTIN) 300 MG capsule TAKE ONE CAPSULE BY MOUTH THREE TIMES DAILY  .  glipiZIDE (GLUCOTROL) 5 MG tablet Take 1 tablet (5 mg total) by mouth 2 (two) times daily before a meal.  . Insulin Glargine, 2 Unit Dial, (TOUJEO MAX SOLOSTAR) 300 UNIT/ML SOPN Inject 30 Units into the skin daily.  . Insulin Pen Needle (NOVOFINE) 32G X 6 MM MISC 1 Act by Does not apply route daily.  Marland Kitchen liraglutide (VICTOZA) 18 MG/3ML SOPN Inject 0.3 mLs (1.8 mg total) into the skin every morning.  Marland Kitchen MAGNESIUM-OXIDE 400 (241.3 Mg) MG tablet TAKE 1 TABLET BY MOUTH TWICE DAILY  . metFORMIN (GLUCOPHAGE-XR) 500 MG 24 hr tablet Take 2 tablets (1,000 mg total) by mouth 2 (two) times daily with a meal.  . metoprolol succinate (TOPROL-XL) 100 MG 24 hr tablet TAKE 1 TABLET BY  MOUTH TWICE DAILY  . nitroGLYCERIN (NITROSTAT) 0.4 MG SL tablet Place 1 tablet (0.4 mg total) under the tongue every 5 (five) minutes as needed for chest pain (x 3 doses). Reported on 05/08/2015  . ONE TOUCH ULTRA TEST test strip USE TO TEST BLOOD SUGAR FOUR TIMES DAILY AS DIRECTED  . potassium chloride SA (K-DUR,KLOR-CON) 20 MEQ tablet TAKE 1 TABLET BY MOUTH TWICE DAILY.  Marland Kitchen spironolactone (ALDACTONE) 25 MG tablet TAKE 1 TABLET BY MOUTH TWICE DAILY  . Suvorexant (BELSOMRA) 15 MG TABS Take 1 tablet by mouth at bedtime as needed.  Marland Kitchen telmisartan (MICARDIS) 40 MG tablet Take 1 tablet (40 mg total) by mouth daily.  Marland Kitchen warfarin (COUMADIN) 3 MG tablet Take 1 tablet daily or As directed by anticoagulation clinic  . [DISCONTINUED] ALPRAZolam (XANAX) 1 MG tablet Take 1 tablet (1 mg total) by mouth 3 (three) times daily as needed for anxiety.     Allergies:   Metformin and related, Cleocin [clindamycin hcl], Codeine, Doxycycline hyclate, Macrolides and ketolides, Morphine, Pentazocine lactate, Vibramycin [doxycycline calcium], Definity [perflutren lipid microsphere], and Sulfonamide derivatives   Social History   Socioeconomic History  . Marital status: Married    Spouse name: Not on file  . Number of children: Not on file  . Years of education: Not on file  . Highest education level: Not on file  Occupational History  . Not on file  Social Needs  . Financial resource strain: Not on file  . Food insecurity    Worry: Not on file    Inability: Not on file  . Transportation needs    Medical: Not on file    Non-medical: Not on file  Tobacco Use  . Smoking status: Former Smoker    Quit date: 03/07/2001    Years since quitting: 17.6  . Smokeless tobacco: Never Used  Substance and Sexual Activity  . Alcohol use: No  . Drug use: No  . Sexual activity: Not Currently  Lifestyle  . Physical activity    Days per week: Not on file    Minutes per session: Not on file  . Stress: Not on file   Relationships  . Social Herbalist on phone: Not on file    Gets together: Not on file    Attends religious service: Not on file    Active member of club or organization: Not on file    Attends meetings of clubs or organizations: Not on file    Relationship status: Not on file  Other Topics Concern  . Not on file  Social History Narrative   Lives locally, has help available if needed.     Family History: The patient's family history includes Anxiety disorder in her  maternal aunt; Arthritis in an other family member; Colon cancer in her paternal uncle; Depression in her sister; Diabetes in an other family member; Heart attack in her father; Heart disease in her father; Hypertension in an other family member; Kidney disease in an other family member; Stroke in her mother and another family member.  ROS:   Please see the history of present illness.    All other systems reviewed and are negative.  EKGs/Labs/Other Studies Reviewed:    The following studies were reviewed today: Echo 09/02/2014: Study Conclusions  - Procedure narrative: Transthoracic echocardiography for left   ventricular function evaluation, for right ventricular function   evaluation, and for assessment of valvular function. Image   quality was suboptimal. Intravenous contrast (Definity) was   administered to enhance regional wall motion assessment and   opacify the LV. - Left ventricle: The cavity size was normal. Wall thickness was   normal. Systolic function was normal. The estimated ejection   fraction was in the range of 50% to 55%. Images were inadequate   for LV wall motion assessment. The study is not technically   sufficient to allow evaluation of LV diastolic function.  Impressions:  - Very technically difficult study. LVEF is around 50-55%,   inccordinate septal motion, definity contrast was administered,   however, echo windows remained poor.  EKG:  EKG is not ordered today.     Recent Labs: 12/28/2017: ALT 10 07/05/2018: Hemoglobin 13.1; Platelets 386.0 Repeated and verified X2.; Pro B Natriuretic peptide (BNP) 24.0; TSH 2.84 08/14/2018: BUN 19; Creatinine, Ser 0.96; Potassium 4.2; Sodium 140  Recent Lipid Panel    Component Value Date/Time   CHOL 108 12/26/2017 0758   TRIG 109 12/26/2017 0758   HDL 38 (L) 12/26/2017 0758   CHOLHDL 2.8 12/26/2017 0758   CHOLHDL 2.7 09/01/2015 1344   VLDL 21 09/01/2015 1344   LDLCALC 48 12/26/2017 0758   LDLDIRECT 116.0 11/28/2008 1122    Physical Exam:    VS:  BP 130/80   Pulse 89   Ht 5' 0.5" (1.537 m)   Wt 206 lb 9.6 oz (93.7 kg)   SpO2 94%   BMI 39.68 kg/m     Wt Readings from Last 3 Encounters:  10/22/18 206 lb 9.6 oz (93.7 kg)  08/14/18 211 lb (95.7 kg)  07/05/18 209 lb (94.8 kg)     GEN: Well nourished, well developed in no acute distress HEENT: Normal NECK: No JVD; No carotid bruits LYMPHATICS: No lymphadenopathy CARDIAC: RRR, no murmurs, rubs, gallops RESPIRATORY:  Clear to auscultation without rales, wheezing or rhonchi  ABDOMEN: Soft, non-tender, non-distended MUSCULOSKELETAL:  No edema; No deformity  SKIN: Warm and dry NEUROLOGIC:  Alert and oriented x 3 PSYCHIATRIC:  Normal affect   ASSESSMENT:    1. PAF (paroxysmal atrial fibrillation) ([place])   2. Coronary artery disease involving native coronary artery of native heart without angina pectoris   3. Essential hypertension   4. Mixed hyperlipidemia    PLAN:    In order of problems listed above:  1. Appears stable, maintaining sinus rhythm. Continue anticoagulation with warfarin. High CHADS-Vasc with CAD, female gender, diabetes, and diabetes (5). 2. No angina on 200 mg Toprol daily. 3. BP well-controlled. On an ARB in the setting of Type II diabetes. 4. Treated with a high intensity statin drug (atorvastatin 40 mg)   Medication Adjustments/Labs and Tests Ordered: Current medicines are reviewed at length with the patient today.  Concerns  regarding medicines are outlined above.  No orders of the defined types were placed in this encounter.  No orders of the defined types were placed in this encounter.   Patient Instructions  Medication Instructions:  Your provider recommends that you continue on your current medications as directed. Please refer to the Current Medication list given to you today.    Labwork: None  Testing/Procedures: None  Follow-Up: Your provider wants you to follow-up in: 6 months with Dr. Burt Knack. You will receive a reminder letter in the mail two months in advance. If you don't receive a letter, please call our office to schedule the follow-up appointment.       Signed, Sherren Mocha, MD  10/27/2018 3:32 PM    [facility]

## 2018-10-22 NOTE — Patient Instructions (Signed)
Medication Instructions:  Your provider recommends that you continue on your current medications as directed. Please refer to the Current Medication list given to you today.    Labwork: None  Testing/Procedures: None  Follow-Up: Your provider wants you to follow-up in: 6 months with Dr. Cooper. You will receive a reminder letter in the mail two months in advance. If you don't receive a letter, please call our office to schedule the follow-up appointment.    

## 2018-10-24 DIAGNOSIS — M25511 Pain in right shoulder: Secondary | ICD-10-CM | POA: Diagnosis not present

## 2018-10-24 DIAGNOSIS — G894 Chronic pain syndrome: Secondary | ICD-10-CM | POA: Diagnosis not present

## 2018-10-24 DIAGNOSIS — Z79891 Long term (current) use of opiate analgesic: Secondary | ICD-10-CM | POA: Diagnosis not present

## 2018-10-24 DIAGNOSIS — M25512 Pain in left shoulder: Secondary | ICD-10-CM | POA: Diagnosis not present

## 2018-10-25 ENCOUNTER — Ambulatory Visit (INDEPENDENT_AMBULATORY_CARE_PROVIDER_SITE_OTHER): Payer: Medicare Other | Admitting: Psychiatry

## 2018-10-25 ENCOUNTER — Other Ambulatory Visit: Payer: Self-pay

## 2018-10-25 DIAGNOSIS — F4322 Adjustment disorder with anxiety: Secondary | ICD-10-CM | POA: Diagnosis not present

## 2018-10-25 DIAGNOSIS — F411 Generalized anxiety disorder: Secondary | ICD-10-CM

## 2018-10-25 MED ORDER — ALPRAZOLAM 1 MG PO TABS: 1.0000 mg | ORAL_TABLET | Freq: Three times a day (TID) | ORAL | 5 refills | Status: DC | PRN

## 2018-10-25 NOTE — Progress Notes (Signed)
None Patient ID: Kristen [name], female   DOB: 09-Feb-1946, 73 y.o.   MRN: [id number] [facility] MD Progress Note  10/25/2018 2:34 PM Kristen [name]  MRN:  [id number] Subjective:  Scared Principal Problem: Adjustment disorder with an anxious mood state  Today the patient is stable.  Her anxiety is well controlled.  She does feel somewhat isolated by the pandemic.  Her family is very protective.  They will not let her go out and they rarely come to see her physically.  He talks on the phone with her all the time.  The patient's husband goes out and gets groceries.  So together the patient and her husband cook food.  The patient does watch a little TV.  She can start doing crossword puzzles and consider adult coloring books.  The patient denies feeling sad or down.  She denies anxiety.  She is well controlled taking Xanax at a fixed dose.  She never abuses any drugs or alcohol.  She is actually very stable.  She is functioning well.  She does have some chronic leg pain which sometimes affects her when she sleeps.  But for the most part she is functioning well enough just taking the Xanax.  There is no evidence of psychosis.  She is not dangerous to herself or to anyone else.  I believe she is very stable.    Past Medical History:  Diagnosis Date  . Arthritis   . Blood transfusion without reported diagnosis   . Bursitis   . CAD (coronary artery disease)    a. s/p CABG 2004.b. stable cath 2014 demonstrating stable CAD and continued patency of her LIMA graft.  . Chronic anticoagulation    on coumadin  . DDD (degenerative disc disease), lumbar   . Depression   . Diabetes mellitus   . Diastolic dysfunction    per echo in October 2012 with EF 50 to 55%  . Fibromyalgia   . GERD (gastroesophageal reflux disease) 10/23/2003  . Headache(784.0)   . Hyperlipidemia   . Hypertension   . Hypokalemia   . LBBB (left bundle branch block)   . Lumbar back pain   . LV dysfunction    a. EF 45% by cath  2014. b. EF 50-55% by technically difficult echo in 08/2014.  Marland Kitchen Lymphadenitis   . Morbid obesity ([place])    a. Sleep study negative for significant OSA in 11/2014.  Marland Kitchen PAF (paroxysmal atrial fibrillation) ([place])   . Stroke [facility]) 2004   affected speech per pt    Past Surgical History:  Procedure Laterality Date  . ABDOMINAL HYSTERECTOMY    . ANGIOPLASTY  laminectomy  . CHOLECYSTECTOMY    . CORONARY ARTERY BYPASS GRAFT     LIMA to LAD   . KNEE ARTHROSCOPY    . LEFT HEART CATHETERIZATION WITH CORONARY/GRAFT ANGIOGRAM  12/07/2012   Procedure: LEFT HEART CATHETERIZATION WITH Beatrix Fetters;  Surgeon: Blane Ohara, MD;  Location: [facility] CATH LAB;  Service: Cardiovascular;;  . LUMBAR LAMINECTOMY     x3  . SPHINCTEROTOMY    . TONSILLECTOMY     Family History:  Family History  Problem Relation Age of Onset  . Heart attack Father   . Heart disease Father   . Stroke Mother   . Kidney disease Other   . Stroke Other   . Arthritis Other   . Hypertension Other   . Diabetes Other   . Colon cancer Paternal Uncle   . Depression Sister   .  Anxiety disorder Maternal Aunt    Family Psychiatric  History:  Social History:  Social History   Substance and Sexual Activity  Alcohol Use No     Social History   Substance and Sexual Activity  Drug Use No    Social History   Socioeconomic History  . Marital status: Married    Spouse name: Not on file  . Number of children: Not on file  . Years of education: Not on file  . Highest education level: Not on file  Occupational History  . Not on file  Social Needs  . Financial resource strain: Not on file  . Food insecurity    Worry: Not on file    Inability: Not on file  . Transportation needs    Medical: Not on file    Non-medical: Not on file  Tobacco Use  . Smoking status: Former Smoker    Quit date: 03/07/2001    Years since quitting: 17.6  . Smokeless tobacco: Never Used  Substance and Sexual Activity  . Alcohol use: No   . Drug use: No  . Sexual activity: Not Currently  Lifestyle  . Physical activity    Days per week: Not on file    Minutes per session: Not on file  . Stress: Not on file  Relationships  . Social Herbalist on phone: Not on file    Gets together: Not on file    Attends religious service: Not on file    Active member of club or organization: Not on file    Attends meetings of clubs or organizations: Not on file    Relationship status: Not on file  Other Topics Concern  . Not on file  Social History Narrative   Lives locally, has help available if needed.   Additional Social History:                         Sleep: Fair  Appetite:  Fair  Current Medications: Current Outpatient Medications  Medication Sig Dispense Refill  . ALPRAZolam (XANAX) 1 MG tablet Take 1 tablet (1 mg total) by mouth 3 (three) times daily as needed for anxiety. 90 tablet 5  . atorvastatin (LIPITOR) 40 MG tablet TAKE 1 TABLET BY MOUTH DAILY 90 tablet 1  . Blood Glucose Monitoring Suppl (ONE TOUCH ULTRA 2) w/Device KIT Use as advised 1 each 0  . cyclobenzaprine (FLEXERIL) 10 MG tablet TK 1 T PO TID PRN    . empagliflozin (JARDIANCE) 25 MG TABS tablet Take 25 mg by mouth daily. 90 tablet 1  . furosemide (LASIX) 20 MG tablet Take 1 tablet (20 mg total) by mouth daily. 90 tablet 1  . gabapentin (NEURONTIN) 300 MG capsule TAKE ONE CAPSULE BY MOUTH THREE TIMES DAILY 90 capsule 5  . glipiZIDE (GLUCOTROL) 5 MG tablet Take 1 tablet (5 mg total) by mouth 2 (two) times daily before a meal. 180 tablet 0  . Insulin Glargine, 2 Unit Dial, (TOUJEO MAX SOLOSTAR) 300 UNIT/ML SOPN Inject 30 Units into the skin daily. 9 mL 1  . Insulin Pen Needle (NOVOFINE) 32G X 6 MM MISC 1 Act by Does not apply route daily. 100 each 3  . liraglutide (VICTOZA) 18 MG/3ML SOPN Inject 0.3 mLs (1.8 mg total) into the skin every morning. 27 mL 3  . MAGNESIUM-OXIDE 400 (241.3 Mg) MG tablet TAKE 1 TABLET BY MOUTH TWICE DAILY 60  tablet 5  . metFORMIN (GLUCOPHAGE-XR) 500  MG 24 hr tablet Take 2 tablets (1,000 mg total) by mouth 2 (two) times daily with a meal. 360 tablet 3  . metoprolol succinate (TOPROL-XL) 100 MG 24 hr tablet TAKE 1 TABLET BY MOUTH TWICE DAILY 180 tablet 1  . nitroGLYCERIN (NITROSTAT) 0.4 MG SL tablet Place 1 tablet (0.4 mg total) under the tongue every 5 (five) minutes as needed for chest pain (x 3 doses). Reported on 05/08/2015 35 tablet 2  . ONE TOUCH ULTRA TEST test strip USE TO TEST BLOOD SUGAR FOUR TIMES DAILY AS DIRECTED 300 each 2  . potassium chloride SA (K-DUR,KLOR-CON) 20 MEQ tablet TAKE 1 TABLET BY MOUTH TWICE DAILY. 180 tablet 1  . spironolactone (ALDACTONE) 25 MG tablet TAKE 1 TABLET BY MOUTH TWICE DAILY 180 tablet 0  . Suvorexant (BELSOMRA) 15 MG TABS Take 1 tablet by mouth at bedtime as needed. 90 tablet 0  . telmisartan (MICARDIS) 40 MG tablet Take 1 tablet (40 mg total) by mouth daily. 90 tablet 1  . warfarin (COUMADIN) 3 MG tablet Take 1 tablet daily or As directed by anticoagulation clinic 30 tablet 2   No current facility-administered medications for this visit.     Lab Results:  No results found. However, due to the size of the patient record, not all encounters were searched. Please check Results Review for a complete set of results.  Physical Findings: AIMS:  , ,  ,  ,    CIWA:    COWS:     Musculoskeletal: Strength & Muscle Tone: within normal limits Gait & Station: normal Patient leans: Right  Psychiatric Specialty Exam: ROS  There were no vitals taken for this visit.There is no height or weight on file to calculate BMI.  General Appearance: NA  Eye Contact::  Good  Speech:  Clear and Coherent  Volume:  Normal  Mood:  NA  Affect:  Congruent  Thought Process:  Coherent  Orientation:  Full (Time, Place, and Person)  Thought Content:  WDL  Suicidal Thoughts:  No  Homicidal Thoughts:  No  Memory:  NA  Judgement:  Good  Insight:  Good  Psychomotor Activity:   Normal  Concentration:  Good  Recall:  Good  Fund of Knowledge:Fair  Language: Good  Akathisia:  No  Handed:  Left  AIMS (if indicated):     Assets:  Desire for Improvement  ADL's:  Intact  Cognition: WNL  Sleep:      Treatment Plan Summary:  At this time the patient is #1 problem is generalized anxiety disorder.  She takes Xanax 1 mg 3 times daily.  She is done very well on this medicine.  Efforts in the distant past to taper off of it has led to a significant decline.  The patient never misuses her medicines.  She is not oversedated.  She is had no falls.  Medically she is relatively healthy.  She will be reevaluated in 4 to 5 months.

## 2018-11-13 ENCOUNTER — Other Ambulatory Visit: Payer: Self-pay | Admitting: Internal Medicine

## 2018-11-13 DIAGNOSIS — Z7901 Long term (current) use of anticoagulants: Secondary | ICD-10-CM

## 2018-11-13 MED ORDER — WARFARIN SODIUM 3 MG PO TABS: ORAL_TABLET | ORAL | 2 refills | Status: DC

## 2018-11-15 ENCOUNTER — Other Ambulatory Visit: Payer: Self-pay

## 2018-11-15 NOTE — Patient Outreach (Signed)
[facility]) Care Management  11/15/2018   Kristen [name] Dec 03, 1945 [id number]  Subjective: Successful outreach to the patient.  Two patient identifiers given.  The patient states that she has not been doing well.  She denies any pain or falls.  She states that she has not had her Victoza.  She is working with her doctor's office to get her medication but she has not been able to take in the paper work requested.  She states that she is taking all of the rest of her medications.  She has an appointment to have her INR checked and she is going to have copies made of her paperwork and have them sent then.  She is planning on having her flu shot given as well.  She states that she does not have any control on the food purchased in the home.  Her husband does all of the grocery shopping and mainly gets things that he likes.  Advised the patient to try to make good food choices with what she has.  She states that she has her mind set on lowering her a1c.   Current Medications:  Current Outpatient Medications  Medication Sig Dispense Refill  . ALPRAZolam (XANAX) 1 MG tablet Take 1 tablet (1 mg total) by mouth 3 (three) times daily as needed for anxiety. 90 tablet 5  . atorvastatin (LIPITOR) 40 MG tablet TAKE 1 TABLET BY MOUTH DAILY 90 tablet 1  . Blood Glucose Monitoring Suppl (ONE TOUCH ULTRA 2) w/Device KIT Use as advised 1 each 0  . cyclobenzaprine (FLEXERIL) 10 MG tablet TK 1 T PO TID PRN    . empagliflozin (JARDIANCE) 25 MG TABS tablet Take 25 mg by mouth daily. 90 tablet 1  . furosemide (LASIX) 20 MG tablet Take 1 tablet (20 mg total) by mouth daily. 90 tablet 1  . gabapentin (NEURONTIN) 300 MG capsule TAKE ONE CAPSULE BY MOUTH THREE TIMES DAILY 90 capsule 5  . glipiZIDE (GLUCOTROL) 5 MG tablet Take 1 tablet (5 mg total) by mouth 2 (two) times daily before a meal. 180 tablet 0  . Insulin Pen Needle (NOVOFINE) 32G X 6 MM MISC 1 Act by Does not apply route daily. 100 each  3  . MAGNESIUM-OXIDE 400 (241.3 Mg) MG tablet TAKE 1 TABLET BY MOUTH TWICE DAILY 60 tablet 5  . metFORMIN (GLUCOPHAGE-XR) 500 MG 24 hr tablet Take 2 tablets (1,000 mg total) by mouth 2 (two) times daily with a meal. 360 tablet 3  . metoprolol succinate (TOPROL-XL) 100 MG 24 hr tablet TAKE 1 TABLET BY MOUTH TWICE DAILY 180 tablet 1  . nitroGLYCERIN (NITROSTAT) 0.4 MG SL tablet Place 1 tablet (0.4 mg total) under the tongue every 5 (five) minutes as needed for chest pain (x 3 doses). Reported on 05/08/2015 35 tablet 2  . ONE TOUCH ULTRA TEST test strip USE TO TEST BLOOD SUGAR FOUR TIMES DAILY AS DIRECTED 300 each 2  . potassium chloride SA (K-DUR,KLOR-CON) 20 MEQ tablet TAKE 1 TABLET BY MOUTH TWICE DAILY. 180 tablet 1  . spironolactone (ALDACTONE) 25 MG tablet TAKE 1 TABLET BY MOUTH TWICE DAILY 180 tablet 0  . Suvorexant (BELSOMRA) 15 MG TABS Take 1 tablet by mouth at bedtime as needed. 90 tablet 0  . telmisartan (MICARDIS) 40 MG tablet Take 1 tablet (40 mg total) by mouth daily. 90 tablet 1  . warfarin (COUMADIN) 3 MG tablet Take 1 tablet daily or As directed by anticoagulation clinic 30 tablet 2  .  Insulin Glargine, 2 Unit Dial, (TOUJEO MAX SOLOSTAR) 300 UNIT/ML SOPN Inject 30 Units into the skin daily. (Patient not taking: Reported on 11/15/2018) 9 mL 1  . liraglutide (VICTOZA) 18 MG/3ML SOPN Inject 0.3 mLs (1.8 mg total) into the skin every morning. (Patient not taking: Reported on 11/15/2018) 27 mL 3   No current facility-administered medications for this visit.     Functional Status:  In your present state of health, do you have any difficulty performing the following activities: 02/16/2018 12/29/2017  Hearing? N N  Vision? N N  Difficulty concentrating or making decisions? N Y  Walking or climbing stairs? Y N  Dressing or bathing? N N  Doing errands, shopping? Y Y  Some recent data might be hidden    Fall/Depression Screening: Fall Risk  11/15/2018 11/15/2018 08/06/2018  Falls in the past  year? 0 0 0  Risk for fall due to : - - -   PHQ 2/9 Scores 07/05/2018 02/16/2018 01/02/2018 12/29/2017 08/07/2017 10/12/2016 09/01/2015  PHQ - 2 Score 1 1 0 0 0 1 0  PHQ- 9 Score 10 - - - 2 5 -    Assessment: Patient will continue to benefit from health coach outreach for disease management and support. [facility] CM Care Plan Problem One     Most Recent Value  [facility] Long Term Goal   In 90 days the patient will verbalize that her a1c has lowered 1-2 points  [facility] Long Term Goal Start Date  11/15/18  Interventions for Problem One Long Term Goal  Discussed blood sugar readings,discussed food choices and availabilty,reviewed and encouraged medication adherence, encouraged the patient to continue to go to her appointments and try to get out of the home for some activites when possible. encouraged the patient to recieve her flu shot.     Plan: RN Health Coach will contact patient in the month of December and patient agrees to next outreach.   [name] RN, BSN, [place] Direct Dial:  [phone]  Fax: [phone]

## 2018-11-16 ENCOUNTER — Other Ambulatory Visit: Payer: Self-pay

## 2018-11-16 ENCOUNTER — Other Ambulatory Visit: Payer: Self-pay | Admitting: Internal Medicine

## 2018-11-16 ENCOUNTER — Encounter: Payer: Self-pay | Admitting: Internal Medicine

## 2018-11-16 ENCOUNTER — Ambulatory Visit (INDEPENDENT_AMBULATORY_CARE_PROVIDER_SITE_OTHER): Payer: Medicare Other | Admitting: General Practice

## 2018-11-16 DIAGNOSIS — Z7901 Long term (current) use of anticoagulants: Secondary | ICD-10-CM | POA: Diagnosis not present

## 2018-11-16 DIAGNOSIS — Z23 Encounter for immunization: Secondary | ICD-10-CM | POA: Diagnosis not present

## 2018-11-16 DIAGNOSIS — I480 Paroxysmal atrial fibrillation: Secondary | ICD-10-CM

## 2018-11-16 DIAGNOSIS — E1159 Type 2 diabetes mellitus with other circulatory complications: Secondary | ICD-10-CM

## 2018-11-16 LAB — POCT INR: INR: 1.5 — AB (ref 2.0–3.0)

## 2018-11-16 MED ORDER — FUROSEMIDE 20 MG PO TABS: 20.0000 mg | ORAL_TABLET | Freq: Every day | ORAL | 0 refills | Status: DC

## 2018-11-16 NOTE — Patient Instructions (Signed)
Pre visit review using our clinic review tool, if applicable. No additional management support is needed unless otherwise documented below in the visit note.  Take 3 tablets today (9/11) and take 2 tablets tomorrow (9/12) and then change dosage and take 1 tablet daily except 2 tablets on Mon Wed and Fridays.  Re-check in 2 weeks.

## 2018-11-22 ENCOUNTER — Other Ambulatory Visit: Payer: Self-pay | Admitting: Cardiovascular Disease

## 2018-11-22 DIAGNOSIS — M25511 Pain in right shoulder: Secondary | ICD-10-CM | POA: Diagnosis not present

## 2018-11-22 DIAGNOSIS — Z79891 Long term (current) use of opiate analgesic: Secondary | ICD-10-CM | POA: Diagnosis not present

## 2018-11-22 DIAGNOSIS — G894 Chronic pain syndrome: Secondary | ICD-10-CM | POA: Diagnosis not present

## 2018-11-22 DIAGNOSIS — M25512 Pain in left shoulder: Secondary | ICD-10-CM | POA: Diagnosis not present

## 2018-11-22 DIAGNOSIS — I480 Paroxysmal atrial fibrillation: Secondary | ICD-10-CM

## 2018-11-22 MED ORDER — POTASSIUM CHLORIDE CRYS ER 20 MEQ PO TBCR: 20.0000 meq | EXTENDED_RELEASE_TABLET | Freq: Two times a day (BID) | ORAL | 3 refills | Status: DC

## 2018-11-22 MED ORDER — METOPROLOL SUCCINATE ER 100 MG PO TB24: 100.0000 mg | ORAL_TABLET | Freq: Two times a day (BID) | ORAL | 3 refills | Status: DC

## 2018-11-26 ENCOUNTER — Encounter: Payer: Self-pay | Admitting: Internal Medicine

## 2018-11-30 ENCOUNTER — Other Ambulatory Visit: Payer: Self-pay

## 2018-11-30 ENCOUNTER — Ambulatory Visit (INDEPENDENT_AMBULATORY_CARE_PROVIDER_SITE_OTHER): Payer: Medicare Other | Admitting: General Practice

## 2018-11-30 DIAGNOSIS — Z7901 Long term (current) use of anticoagulants: Secondary | ICD-10-CM | POA: Diagnosis not present

## 2018-11-30 DIAGNOSIS — I480 Paroxysmal atrial fibrillation: Secondary | ICD-10-CM

## 2018-11-30 LAB — POCT INR: INR: 2 (ref 2.0–3.0)

## 2018-11-30 NOTE — Patient Instructions (Addendum)
Pre visit review using our clinic review tool, if applicable. No additional management support is needed unless otherwise documented below in the visit note.  Continue to take 1 tablet daily except 2 tablets on Mon Wed and Fridays.  Re-check in 4 weeks.

## 2018-11-30 NOTE — Progress Notes (Signed)
Medical screening examination/treatment/procedure(s) were performed by non-physician practitioner and as supervising physician I was immediately available for consultation/collaboration. I agree with above. James John, MD   

## 2018-12-05 ENCOUNTER — Encounter: Payer: Self-pay | Admitting: Internal Medicine

## 2018-12-10 ENCOUNTER — Other Ambulatory Visit (HOSPITAL_COMMUNITY): Payer: Self-pay | Admitting: Psychiatry

## 2018-12-10 ENCOUNTER — Other Ambulatory Visit: Payer: Self-pay | Admitting: Cardiovascular Disease

## 2018-12-12 ENCOUNTER — Encounter: Payer: Self-pay | Admitting: Internal Medicine

## 2018-12-12 ENCOUNTER — Other Ambulatory Visit: Payer: Self-pay | Admitting: Internal Medicine

## 2018-12-12 DIAGNOSIS — L309 Dermatitis, unspecified: Secondary | ICD-10-CM

## 2018-12-12 MED ORDER — TRIAMCINOLONE ACETONIDE 0.5 % EX CREA: 1.0000 "application " | TOPICAL_CREAM | Freq: Three times a day (TID) | CUTANEOUS | 3 refills | Status: DC

## 2018-12-14 ENCOUNTER — Encounter: Payer: Self-pay | Admitting: Internal Medicine

## 2018-12-14 NOTE — Telephone Encounter (Signed)
Pt assistance has arrived and pt has been contacted and informed of same.

## 2018-12-17 NOTE — Telephone Encounter (Addendum)
Pt sent a My Chart message to Dr. Nance Pew that she had vomited 12/17/18 Saturday... she was nauseated since Friday 12/16/18...she had an urgency to have a bowel movement.. she had diarrhea.. she was concerned she had a GI virus.   Her BP Saturday 107/48 and HR 105 and the retail pharmacist told her she could take an extra metoprolol 50mg  and she says it helped.  She is improved today... BP 134/77 but HR 100... her O2 sat is going up and down to 90-93.  She has had some palpitations on and off... but no dizziness, edema, chest pain.   She is going to have a HGBA1C with Dr. Ronnald Ramp and she is going to talk with him about rechecking her electrolytes. She feels her BS has been off lately also.   She will also talk with Dr. Ronnald Ramp about her O2sat and she thinks she needs a new O2sat monitor since it has not been working well lately.   I will forward to Dr. Burt Knack for his review.. I have also asked her to have Dr. Ronnald Ramp to send her labs to [place].   Last OV 10/2018  next OV with Dr Burt Knack in 6 Mos... 05/2019.   Addendum: Pt sent a new My Chart she will be seeing her PCP Dr. Ronnald Ramp Wed 12/19/18 at 4pm.

## 2018-12-18 ENCOUNTER — Telehealth: Payer: Self-pay | Admitting: Internal Medicine

## 2018-12-18 ENCOUNTER — Other Ambulatory Visit: Payer: Self-pay | Admitting: Internal Medicine

## 2018-12-18 ENCOUNTER — Encounter: Payer: Self-pay | Admitting: Internal Medicine

## 2018-12-18 ENCOUNTER — Ambulatory Visit: Payer: Medicare Other | Admitting: Internal Medicine

## 2018-12-18 DIAGNOSIS — E039 Hypothyroidism, unspecified: Secondary | ICD-10-CM

## 2018-12-18 DIAGNOSIS — K219 Gastro-esophageal reflux disease without esophagitis: Secondary | ICD-10-CM

## 2018-12-18 MED ORDER — ONDANSETRON 4 MG PO TBDP: 4.0000 mg | ORAL_TABLET | Freq: Three times a day (TID) | ORAL | 0 refills | Status: DC | PRN

## 2018-12-18 NOTE — Progress Notes (Signed)
Virtual Visit via Video Note  I connected with Kristen [name] on 12/18/18 at  2:00 PM EDT by a video enabled telemedicine application and verified that I am speaking with the correct person using two identifiers.   I discussed the limitations of evaluation and management by telemedicine and the availability of in person appointments. The patient expressed understanding and agreed to proceed.  History of Present Illness: She checked in for a virtual visit.  She was told that she could not come in to see me because there was concern she may have COVID-19.  She tells me she has had nausea and a couple of episodes of vomiting over the last few weeks.  She tells me that a nurse sent in a prescription last week for something for nausea and she has felt better.  She also complains that her blood sugars have been high, she is not been able to afford some of her meds and she has developed shortness of breath.  She has a pulse ox at home and says her pulse ox sometimes dips down to 90%.    Observations/Objective: Calm female in no acute distress.  Speech and demeanor are normal.   Assessment and Plan: I am not sure what is causing her symptoms but I do not think she has COVID-19.  I therefore asked her to come in to be seen in person to evaluate her symptoms.  She agrees to this plan.  Lab Results  Component Value Date   WBC 8.7 07/05/2018   HGB 13.1 07/05/2018   HCT 38.2 07/05/2018   PLT 386.0 Repeated and verified X2. 07/05/2018   GLUCOSE 239 (H) 08/14/2018   CHOL 108 12/26/2017   TRIG 109 12/26/2017   HDL 38 (L) 12/26/2017   LDLDIRECT 116.0 11/28/2008   LDLCALC 48 12/26/2017   ALT 10 12/28/2017   AST 8 12/28/2017   NA 140 08/14/2018   K 4.2 08/14/2018   CL 105 08/14/2018   CREATININE 0.96 08/14/2018   BUN 19 08/14/2018   CO2 19 08/14/2018   TSH 2.84 07/05/2018   INR 2.0 11/30/2018   HGBA1C 8.8 (A) 07/05/2018   MICROALBUR <0.7 12/28/2017     Follow Up Instructions: She will  come in to see me as soon as possible.  She will let me know in the meantime if she develops any new or worsening symptoms.  She agrees to go to the ED if her symptoms become urgent.    I discussed the assessment and treatment plan with the patient. The patient was provided an opportunity to ask questions and all were answered. The patient agreed with the plan and demonstrated an understanding of the instructions.   The patient was advised to call back or seek an in-person evaluation if the symptoms worsen or if the condition fails to improve as anticipated.  I provided 15 minutes of non-face-to-face time during this encounter.   Scarlette Calico, MD

## 2018-12-18 NOTE — Telephone Encounter (Signed)
Patient is requesting refill on medication not on current med list. Please see message below.

## 2018-12-18 NOTE — Telephone Encounter (Signed)
Medication Refill - Medication: ondansetron (ZOFRAN) injection 4 mg      Preferred Pharmacy (with phone number or street name):  [id number] - [place], [address]  [place] [address]  Phone: [phone] Fax: [phone]     Agent: Please be advised that RX refills may take up to 3 business days. We ask that you follow-up with your pharmacy.

## 2018-12-18 NOTE — Telephone Encounter (Signed)
Pt contacted and I informed her the office policy for seeing pt with sx. Pt stated that she has not been anywhere and has not seen anyone but her husband. Pt reassured that we would be able to do a virtual visit. Pt agreed. Link has been sent.

## 2018-12-19 ENCOUNTER — Ambulatory Visit (INDEPENDENT_AMBULATORY_CARE_PROVIDER_SITE_OTHER): Payer: Medicare Other | Admitting: Internal Medicine

## 2018-12-19 ENCOUNTER — Other Ambulatory Visit: Payer: Self-pay

## 2018-12-19 ENCOUNTER — Encounter: Payer: Self-pay | Admitting: Internal Medicine

## 2018-12-19 ENCOUNTER — Other Ambulatory Visit (INDEPENDENT_AMBULATORY_CARE_PROVIDER_SITE_OTHER): Payer: Medicare Other

## 2018-12-19 ENCOUNTER — Ambulatory Visit: Payer: Medicare Other | Admitting: Internal Medicine

## 2018-12-19 VITALS — BP 118/70 | HR 97 | Temp 98.3°F | Ht 60.5 in | Wt 204.0 lb

## 2018-12-19 DIAGNOSIS — E039 Hypothyroidism, unspecified: Secondary | ICD-10-CM | POA: Diagnosis not present

## 2018-12-19 DIAGNOSIS — R0602 Shortness of breath: Secondary | ICD-10-CM | POA: Diagnosis not present

## 2018-12-19 DIAGNOSIS — D539 Nutritional anemia, unspecified: Secondary | ICD-10-CM

## 2018-12-19 DIAGNOSIS — I5042 Chronic combined systolic (congestive) and diastolic (congestive) heart failure: Secondary | ICD-10-CM

## 2018-12-19 DIAGNOSIS — Z20828 Contact with and (suspected) exposure to other viral communicable diseases: Secondary | ICD-10-CM

## 2018-12-19 DIAGNOSIS — I25119 Atherosclerotic heart disease of native coronary artery with unspecified angina pectoris: Secondary | ICD-10-CM

## 2018-12-19 DIAGNOSIS — E1151 Type 2 diabetes mellitus with diabetic peripheral angiopathy without gangrene: Secondary | ICD-10-CM

## 2018-12-19 DIAGNOSIS — I10 Essential (primary) hypertension: Secondary | ICD-10-CM

## 2018-12-19 DIAGNOSIS — E1159 Type 2 diabetes mellitus with other circulatory complications: Secondary | ICD-10-CM | POA: Diagnosis not present

## 2018-12-19 DIAGNOSIS — I480 Paroxysmal atrial fibrillation: Secondary | ICD-10-CM | POA: Diagnosis not present

## 2018-12-19 DIAGNOSIS — Z20822 Contact with and (suspected) exposure to covid-19: Secondary | ICD-10-CM

## 2018-12-19 LAB — HEPATIC FUNCTION PANEL
ALT: 12 U/L (ref 0–35)
AST: 15 U/L (ref 0–37)
Albumin: 4.9 g/dL (ref 3.5–5.2)
Alkaline Phosphatase: 47 U/L (ref 39–117)
Bilirubin, Direct: 0.3 mg/dL (ref 0.0–0.3)
Total Bilirubin: 1.5 mg/dL — ABNORMAL HIGH (ref 0.2–1.2)
Total Protein: 7.8 g/dL (ref 6.0–8.3)

## 2018-12-19 LAB — POCT GLYCOSYLATED HEMOGLOBIN (HGB A1C): Hemoglobin A1C: 9 % — AB (ref 4.0–5.6)

## 2018-12-19 LAB — POCT GLUCOSE (DEVICE FOR HOME USE): Glucose Fasting, POC: 198 mg/dL — AB (ref 70–99)

## 2018-12-19 LAB — BASIC METABOLIC PANEL
BUN: 21 mg/dL (ref 6–23)
CO2: 22 mEq/L (ref 19–32)
Calcium: 10.2 mg/dL (ref 8.4–10.5)
Chloride: 101 mEq/L (ref 96–112)
Creatinine, Ser: 1.01 mg/dL (ref 0.40–1.20)
GFR: 65.38 mL/min (ref >60.00)
Glucose, Bld: 167 mg/dL — ABNORMAL HIGH (ref 70–99)
Potassium: 4.2 mEq/L (ref 3.5–5.1)
Sodium: 135 mEq/L (ref 135–145)

## 2018-12-19 LAB — D-DIMER, QUANTITATIVE: D-Dimer, Quant: 0.9 mcg/mL FEU — ABNORMAL HIGH (ref <0.50)

## 2018-12-19 MED ORDER — TOUJEO MAX SOLOSTAR 300 UNIT/ML ~~LOC~~ SOPN: 50.0000 [IU] | PEN_INJECTOR | Freq: Every day | SUBCUTANEOUS | 0 refills | Status: DC

## 2018-12-19 NOTE — Progress Notes (Signed)
Subjective:  Patient ID: Kristen [name], female    DOB: [birth year]  Age: 73 y.o. MRN: [id number]  CC: Shortness of Breath   HPI Kristen [name] presents for f/up - She complains of a 2-week history of shortness of breath and now a 5-day history of nausea and vomiting.  Someone else prescribe Zofran which has helped control the nausea and vomiting.  She complains of constipation but denies hematemesis, coffee-ground emesis, abdominal pain, diarrhea, melena, or bright red blood per rectum.  She notes her blood sugars have been high and she tells me she has not recently been able to pay for Victoza, Jardiance, or Toujeo.  Outpatient Medications Prior to Visit  Medication Sig Dispense Refill   ALPRAZolam (XANAX) 1 MG tablet Take 1 tablet (1 mg total) by mouth 3 (three) times daily as needed for anxiety. 90 tablet 5   atorvastatin (LIPITOR) 40 MG tablet TAKE 1 TABLET BY MOUTH DAILY 90 tablet 1   Blood Glucose Monitoring Suppl (ONE TOUCH ULTRA 2) w/Device KIT Use as advised 1 each 0   cyclobenzaprine (FLEXERIL) 10 MG tablet TK 1 T PO TID PRN     empagliflozin (JARDIANCE) 25 MG TABS tablet Take 25 mg by mouth daily. 90 tablet 1   furosemide (LASIX) 20 MG tablet Take 1 tablet (20 mg total) by mouth daily. 90 tablet 0   gabapentin (NEURONTIN) 300 MG capsule TAKE ONE CAPSULE BY MOUTH THREE TIMES DAILY 90 capsule 5   glipiZIDE (GLUCOTROL) 5 MG tablet Take 1 tablet (5 mg total) by mouth 2 (two) times daily before a meal. 180 tablet 0   Insulin Pen Needle (NOVOFINE) 32G X 6 MM MISC 1 Act by Does not apply route daily. 100 each 3   liraglutide (VICTOZA) 18 MG/3ML SOPN Inject 0.3 mLs (1.8 mg total) into the skin every morning. 27 mL 3   MAGNESIUM-OXIDE 400 (241.3 Mg) MG tablet TAKE 1 TABLET BY MOUTH TWICE DAILY 60 tablet 9   metFORMIN (GLUCOPHAGE-XR) 500 MG 24 hr tablet Take 2 tablets (1,000 mg total) by mouth 2 (two) times daily with a meal. 360 tablet 3   metoprolol succinate  (TOPROL-XL) 100 MG 24 hr tablet Take 1 tablet (100 mg total) by mouth 2 (two) times daily. Take with or immediately following a meal. 180 tablet 3   nitroGLYCERIN (NITROSTAT) 0.4 MG SL tablet Place 1 tablet (0.4 mg total) under the tongue every 5 (five) minutes as needed for chest pain (x 3 doses). Reported on 05/08/2015 35 tablet 2   ondansetron (ZOFRAN-ODT) 4 MG disintegrating tablet Take 1 tablet (4 mg total) by mouth every 8 (eight) hours as needed for nausea or vomiting. 20 tablet 0   ONE TOUCH ULTRA TEST test strip USE TO TEST BLOOD SUGAR FOUR TIMES DAILY AS DIRECTED 300 each 2   potassium chloride SA (K-DUR) 20 MEQ tablet Take 1 tablet (20 mEq total) by mouth 2 (two) times daily. 180 tablet 3   spironolactone (ALDACTONE) 25 MG tablet TAKE 1 TABLET BY MOUTH TWICE DAILY 180 tablet 0   telmisartan (MICARDIS) 40 MG tablet Take 1 tablet (40 mg total) by mouth daily. 90 tablet 1   triamcinolone cream (KENALOG) 0.5 % Apply 1 application topically 3 (three) times daily. 60 g 3   warfarin (COUMADIN) 3 MG tablet Take 1 tablet daily or As directed by anticoagulation clinic 30 tablet 2   Insulin Glargine, 2 Unit Dial, (TOUJEO MAX SOLOSTAR) 300 UNIT/ML SOPN Inject 30 Units into the  skin daily. 9 mL 1   No facility-administered medications prior to visit.     ROS Review of Systems  Constitutional: Positive for unexpected weight change (wt loss). Negative for chills, diaphoresis, fatigue and fever.  HENT: Negative.  Negative for trouble swallowing and voice change.   Eyes: Negative.   Respiratory: Positive for shortness of breath. Negative for cough, chest tightness and wheezing.   Endocrine: Negative.  Negative for polydipsia and polyphagia.  Genitourinary: Negative.  Negative for difficulty urinating and dysuria.  Musculoskeletal: Negative.   Skin: Negative.  Negative for color change, pallor and rash.  Neurological: Negative.  Negative for dizziness, weakness and light-headedness.    Hematological: Negative for adenopathy. Does not bruise/bleed easily.  Psychiatric/Behavioral: Negative.     Objective:  BP 118/70 (BP Location: Right Arm, Patient Position: Sitting, Cuff Size: Large)    Pulse 97    Temp 98.3 F (36.8 C) (Oral)    Ht 5' 0.5" (1.537 m)    Wt 204 lb (92.5 kg)    SpO2 96%    BMI 39.19 kg/m   BP Readings from Last 3 Encounters:  12/19/18 118/70  10/22/18 130/80  08/14/18 138/74    Wt Readings from Last 3 Encounters:  12/19/18 204 lb (92.5 kg)  10/22/18 206 lb 9.6 oz (93.7 kg)  08/14/18 211 lb (95.7 kg)    Physical Exam Vitals signs reviewed.  Constitutional:      Appearance: She is obese. She is not ill-appearing or diaphoretic.  HENT:     Mouth/Throat:     Mouth: Mucous membranes are moist.  Eyes:     General: No scleral icterus.    Conjunctiva/sclera: Conjunctivae normal.  Neck:     Musculoskeletal: Neck supple.     Thyroid: No thyromegaly.  Cardiovascular:     Rate and Rhythm: Regular rhythm. Tachycardia present.     Heart sounds: No murmur. No gallop.      Comments: EKG ---  Sinus  Tachycardia  -Left bundle branch block.   ABNORMAL - no change from the prior EKG  Pulmonary:     Effort: Pulmonary effort is normal.     Breath sounds: No stridor. No wheezing, rhonchi or rales.  Abdominal:     General: Abdomen is protuberant. Bowel sounds are normal. There is no distension.     Palpations: Abdomen is soft. There is no hepatomegaly or splenomegaly.     Tenderness: There is no abdominal tenderness.  Musculoskeletal: Normal range of motion.     Right lower leg: No edema.     Left lower leg: No edema.  Lymphadenopathy:     Cervical: No cervical adenopathy.  Skin:    Coloration: Skin is pale and sallow. Skin is not jaundiced.  Neurological:     General: No focal deficit present.     Mental Status: She is alert. Mental status is at baseline.  Psychiatric:        Mood and Affect: Mood normal.        Behavior: Behavior normal.      Lab Results  Component Value Date   WBC 6.7 12/19/2018   HGB 11.5 (L) 12/19/2018   HCT 34.1 (L) 12/19/2018   PLT 292.0 12/19/2018   GLUCOSE 167 (H) 12/19/2018   CHOL 108 12/26/2017   TRIG 109 12/26/2017   HDL 38 (L) 12/26/2017   LDLDIRECT 116.0 11/28/2008   LDLCALC 48 12/26/2017   ALT 12 12/19/2018   AST 15 12/19/2018   NA 135 12/19/2018  K 4.2 12/19/2018   CL 101 12/19/2018   CREATININE 1.01 12/19/2018   BUN 21 12/19/2018   CO2 22 12/19/2018   TSH 1.74 12/19/2018   INR 2.0 11/30/2018   HGBA1C 9.0 (A) 12/19/2018   MICROALBUR <0.7 12/28/2017    Mr Shoulder Left Wo Contrast  Result Date: 10/02/2017 CLINICAL DATA:  Progressive left shoulder pain. Remote history of rotator cuff surgery. EXAM: MRI OF THE LEFT SHOULDER WITHOUT CONTRAST TECHNIQUE: Multiplanar, multisequence MR imaging of the shoulder was performed. No intravenous contrast was administered. COMPARISON:  Radiograph 08/16/2017 FINDINGS: Rotator cuff: Moderate rotator cuff tendinopathy/tendinosis but no recurrent tear is identified. There are interstitial tears but no partial thickness tear. Muscles: Mild diffuse fatty change involving the shoulder musculature. Biceps long head:  Intact Acromioclavicular Joint: Postoperative changes from prior bony decompressive surgery. No findings for bony impingement. Glenohumeral Joint: Moderate to advanced degenerative changes with moderate cartilage loss, joint space narrowing, osteophytic spurring, subchondral cystic change, small joint effusion and mild to moderate synovitis. Labrum:  Labral degenerative changes.  No gross tear. Bones: No acute bony findings. Subchondral cystic change and possible intraosseous ganglion in the humeral head. Other: Moderate subacromial/subdeltoid bursitis. Benign-appearing lipoma is noted near the axillary recess. IMPRESSION: 1. Surgical changes from prior rotator cuff repair and bony decompressive surgery. No findings for recurrent rotator cuff tear.  Moderate supraspinatus tendinopathy/tendinosis. 2. Moderate to advanced glenohumeral joint degenerative changes as detailed above. There is a small joint effusion and mild to moderate synovitis. 3. Intact long head biceps tendon. 4. Advanced labral degenerative changes without obvious tear. 5. Moderate subacromial/subdeltoid bursitis. Electronically Signed   By: Marijo Sanes M.D.   On: 10/02/2017 08:32    Assessment & Plan:   Kristen [name] was seen today for shortness of breath.  Diagnoses and all orders for this visit:  DM (diabetes mellitus), type 2 with peripheral vascular complications ([place])- Her A1c is up to 9.0%.  I gave her samples of toujeo.  She has been approved for patient assistance for Jardiance and Victoza. -     Basic metabolic panel; Future -     Hepatic function panel; Future -     POCT glycosylated hemoglobin (Hb A1C) -     POCT Glucose (Device for Home Use) -     Insulin Glargine, 2 Unit Dial, (TOUJEO MAX SOLOSTAR) 300 UNIT/ML SOPN; Inject 50 Units into the skin daily. -     Amb Referral to Nutrition and Diabetic E -     Consult to [facility]  Acquired hypothyroidism- Her TSH is in the normal range.  She remain on the current dose of levothyroxine. -     TSH; Future  Suspected 2019 novel coronavirus infection -     Novel Coronavirus, NAA ([facility])  Paroxysmal atrial fibrillation ([place])- She is maintaining rate and rhythm control.  Wwill continue anticoagulation with Coumadin.  Hypertension associated with diabetes ([place])- Her blood pressure is adequately well controlled. -     CBC with Differential/Platelet; Future -     Basic metabolic panel; Future -     Hepatic function panel; Future  CHF (congestive heart failure), NYHA class I, chronic, combined ([place])- She has no signs of fluid overload and her BNP is normal.  I do not think her shortness of breath is related to CHF. -     Brain natriuretic peptide; Future  Coronary artery disease involving native coronary  artery of native heart with angina pectoris ([place])- Her EKG is negative for ischemia and her  troponin is negative.  I do not think her shortness of breath is related to cardiac ischemia. -     Troponin I (High Sensitivity); Future  SOB (shortness of breath)- Her D-dimer is very slightly elevated but is practically normal when I factor in her age.  Her EKG shows sinus tachycardia but this is baseline for her.  Her troponin and BNP are normal.  She is already anticoagulated with Coumadin.  I therefore do not think that a work-up for PE is indicated.  I think the shortness of breath is related to new onset anemia. -     EKG 12-Lead -     D-dimer, quantitative (not at [facility]); Future -     Troponin I (High Sensitivity); Future -     Brain natriuretic peptide; Future  Deficiency anemia -     Vitamin B12; Future -     IBC panel; Future -     Ferritin; Future -     Folate; Future -     Vitamin B1; Future -     Reticulocytes; Future   I have changed [name] Dick's Insulin Glargine (2 Unit Dial) to [employer]. I am also having her maintain her ONE TOUCH ULTRA 2, metFORMIN, ONE TOUCH ULTRA TEST, cyclobenzaprine, nitroGLYCERIN, atorvastatin, empagliflozin, gabapentin, Insulin Pen Needle, Victoza, glipiZIDE, telmisartan, spironolactone, ALPRAZolam, warfarin, furosemide, metoprolol succinate, potassium chloride SA, MAGnesium-Oxide, triamcinolone cream, and ondansetron.  Meds ordered this encounter  Medications   Insulin Glargine, 2 Unit Dial, (TOUJEO MAX SOLOSTAR) 300 UNIT/ML SOPN    Sig: Inject 50 Units into the skin daily.    Dispense:  9 mL    Refill:  0     Follow-up: Return in about 4 weeks (around 01/16/2019).  Scarlette Calico, MD

## 2018-12-19 NOTE — Patient Instructions (Signed)
Type 2 Diabetes Mellitus, Diagnosis, Adult Type 2 diabetes (type 2 diabetes mellitus) is a long-term (chronic) disease. In type 2 diabetes, one or both of these problems may be present:  The pancreas does not make enough of a hormone called insulin.  Cells in the body do not respond properly to insulin that the body makes (insulin resistance). Normally, insulin allows blood sugar (glucose) to enter cells in the body. The cells use glucose for energy. Insulin resistance or lack of insulin causes excess glucose to build up in the blood instead of going into cells. As a result, high blood glucose (hyperglycemia) develops. What increases the risk? The following factors may make you more likely to develop type 2 diabetes:  Having a family member with type 2 diabetes.  Being overweight or obese.  Having an inactive (sedentary) lifestyle.  Having been diagnosed with insulin resistance.  Having a history of prediabetes, gestational diabetes, or polycystic ovary syndrome (PCOS).  Being of American-Indian, African-American, Hispanic/Latino, or Asian/Pacific Islander descent. What are the signs or symptoms? In the early stage of this condition, you may not have symptoms. Symptoms develop slowly and may include:  Increased thirst (polydipsia).  Increased hunger(polyphagia).  Increased urination (polyuria).  Increased urination during the night (nocturia).  Unexplained weight loss.  Frequent infections that keep coming back (recurring).  Fatigue.  Weakness.  Vision changes, such as blurry vision.  Cuts or bruises that are slow to heal.  Tingling or numbness in the hands or feet.  Dark patches on the skin (acanthosis nigricans). How is this diagnosed? This condition is diagnosed based on your symptoms, your medical history, a physical exam, and your blood glucose level. Your blood glucose may be checked with one or more of the following blood tests:  A fasting blood glucose (FBG)  test. You will not be allowed to eat (you will fast) for 8 hours or longer before a blood sample is taken.  A random blood glucose test. This test checks blood glucose at any time of day regardless of when you ate.  An A1c (hemoglobin A1c) blood test. This test provides information about blood glucose control over the previous 2-3 months.  An oral glucose tolerance test (OGTT). This test measures your blood glucose at two times: ? After fasting. This is your baseline blood glucose level. ? Two hours after drinking a beverage that contains glucose. You may be diagnosed with type 2 diabetes if:  Your FBG level is 126 mg/dL (7.0 mmol/L) or higher.  Your random blood glucose level is 200 mg/dL (11.1 mmol/L) or higher.  Your A1c level is 6.5% or higher.  Your OGTT result is higher than 200 mg/dL (11.1 mmol/L). These blood tests may be repeated to confirm your diagnosis. How is this treated? Your treatment may be managed by a specialist called an endocrinologist. Type 2 diabetes may be treated by following instructions from your health care provider about:  Making diet and lifestyle changes. This may include: ? Following an individualized nutrition plan that is developed by a diet and nutrition specialist (registered dietitian). ? Exercising regularly. ? Finding ways to manage stress.  Checking your blood glucose level as often as told.  Taking diabetes medicines or insulin daily. This helps to keep your blood glucose levels in the healthy range. ? If you use insulin, you may need to adjust the dosage depending on how physically active you are and what foods you eat. Your health care provider will tell you how to adjust your dosage.    Taking medicines to help prevent complications from diabetes, such as: ? Aspirin. ? Medicine to lower cholesterol. ? Medicine to control blood pressure. Your health care provider will set individualized treatment goals for you. Your goals will be based on  your age, other medical conditions you have, and how you respond to diabetes treatment. Generally, the goal of treatment is to maintain the following blood glucose levels:  Before meals (preprandial): 80-130 mg/dL (4.4-7.2 mmol/L).  After meals (postprandial): below 180 mg/dL (10 mmol/L).  A1c level: less than 7%. Follow these instructions at home: Questions to ask your health care provider  Consider asking the following questions: ? Do I need to meet with a diabetes educator? ? Where can I find a support group for people with diabetes? ? What equipment will I need to manage my diabetes at home? ? What diabetes medicines do I need, and when should I take them? ? How often do I need to check my blood glucose? ? What number can I call if I have questions? ? When is my next appointment? General instructions  Take over-the-counter and prescription medicines only as told by your health care provider.  Keep all follow-up visits as told by your health care provider. This is important.  For more information about diabetes, visit: ? American Diabetes Association (ADA): www.diabetes.org ? American Association of Diabetes Educators (AADE): www.diabeteseducator.org Contact a health care provider if:  Your blood glucose is at or above 240 mg/dL (13.3 mmol/L) for 2 days in a row.  You have been sick or have had a fever for 2 days or longer, and you are not getting better.  You have any of the following problems for more than 6 hours: ? You cannot eat or drink. ? You have nausea and vomiting. ? You have diarrhea. Get help right away if:  Your blood glucose is lower than 54 mg/dL (3.0 mmol/L).  You become confused or you have trouble thinking clearly.  You have difficulty breathing.  You have moderate or large ketone levels in your urine. Summary  Type 2 diabetes (type 2 diabetes mellitus) is a long-term (chronic) disease. In type 2 diabetes, the pancreas does not make enough of a  hormone called insulin, or cells in the body do not respond properly to insulin that the body makes (insulin resistance).  This condition is treated by making diet and lifestyle changes and taking diabetes medicines or insulin.  Your health care provider will set individualized treatment goals for you. Your goals will be based on your age, other medical conditions you have, and how you respond to diabetes treatment.  Keep all follow-up visits as told by your health care provider. This is important. This information is not intended to replace advice given to you by your health care provider. Make sure you discuss any questions you have with your health care provider. Document Released: 02/21/2005 Document Revised: 04/21/2017 Document Reviewed: 03/27/2015 Elsevier Patient Education  2020 Elsevier Inc.  

## 2018-12-20 ENCOUNTER — Encounter: Payer: Self-pay | Admitting: Internal Medicine

## 2018-12-20 ENCOUNTER — Other Ambulatory Visit (INDEPENDENT_AMBULATORY_CARE_PROVIDER_SITE_OTHER): Payer: Medicare Other

## 2018-12-20 ENCOUNTER — Other Ambulatory Visit: Payer: Self-pay

## 2018-12-20 ENCOUNTER — Other Ambulatory Visit: Payer: Self-pay | Admitting: Internal Medicine

## 2018-12-20 DIAGNOSIS — G894 Chronic pain syndrome: Secondary | ICD-10-CM | POA: Diagnosis not present

## 2018-12-20 DIAGNOSIS — R0602 Shortness of breath: Secondary | ICD-10-CM | POA: Insufficient documentation

## 2018-12-20 DIAGNOSIS — D539 Nutritional anemia, unspecified: Secondary | ICD-10-CM | POA: Diagnosis not present

## 2018-12-20 DIAGNOSIS — M25512 Pain in left shoulder: Secondary | ICD-10-CM | POA: Diagnosis not present

## 2018-12-20 DIAGNOSIS — M25511 Pain in right shoulder: Secondary | ICD-10-CM | POA: Diagnosis not present

## 2018-12-20 DIAGNOSIS — Z79891 Long term (current) use of opiate analgesic: Secondary | ICD-10-CM | POA: Diagnosis not present

## 2018-12-20 DIAGNOSIS — D510 Vitamin B12 deficiency anemia due to intrinsic factor deficiency: Secondary | ICD-10-CM | POA: Insufficient documentation

## 2018-12-20 LAB — CBC WITH DIFFERENTIAL/PLATELET
Basophils Absolute: 0 10*3/uL (ref 0.0–0.1)
Basophils Relative: 0.3 % (ref 0.0–3.0)
Eosinophils Absolute: 0 10*3/uL (ref 0.0–0.7)
Eosinophils Relative: 0.4 % (ref 0.0–5.0)
HCT: 34.1 % — ABNORMAL LOW (ref 36.0–46.0)
Hemoglobin: 11.5 g/dL — ABNORMAL LOW (ref 12.0–15.0)
Lymphocytes Relative: 36.2 % (ref 12.0–46.0)
Lymphs Abs: 2.4 10*3/uL (ref 0.7–4.0)
MCHC: 33.8 g/dL (ref 30.0–36.0)
MCV: 132.3 fl — ABNORMAL HIGH (ref 78.0–100.0)
Monocytes Absolute: 0.4 10*3/uL (ref 0.1–1.0)
Monocytes Relative: 6.1 % (ref 3.0–12.0)
Neutro Abs: 3.8 10*3/uL (ref 1.4–7.7)
Neutrophils Relative %: 57 % (ref 43.0–77.0)
Platelets: 292 10*3/uL (ref 150.0–400.0)
RBC: 2.58 Mil/uL — ABNORMAL LOW (ref 3.87–5.11)
RDW: 17.1 % — ABNORMAL HIGH (ref 11.5–15.5)
WBC: 6.7 10*3/uL (ref 4.0–10.5)

## 2018-12-20 LAB — FERRITIN: Ferritin: 72.6 ng/mL (ref 10.0–291.0)

## 2018-12-20 LAB — TROPONIN I (HIGH SENSITIVITY): High Sens Troponin I: <2 ng/L (ref 2–17)

## 2018-12-20 LAB — IBC PANEL
Iron: 178 ug/dL — ABNORMAL HIGH (ref 42–145)
Saturation Ratios: 54.6 % — ABNORMAL HIGH (ref 20.0–50.0)
Transferrin: 233 mg/dL (ref 212.0–360.0)

## 2018-12-20 LAB — VITAMIN B12: Vitamin B-12: <50 pg/mL

## 2018-12-20 LAB — TSH: TSH: 1.74 u[IU]/mL (ref 0.35–4.50)

## 2018-12-20 LAB — BRAIN NATRIURETIC PEPTIDE: Pro B Natriuretic peptide (BNP): 21 pg/mL (ref 0.0–100.0)

## 2018-12-20 LAB — FOLATE: Folate: 22.3 ng/mL (ref >5.9)

## 2018-12-20 NOTE — Patient Outreach (Addendum)
[facility]) Care Management  12/20/2018  Kristen [name] 1945-06-26 [id number]   1st outreach attempt to the patient for follow up to dr. Referral.  No answer.  HIPAA compliant voicemail left with contact information.  Plan: RN Health Coach will send letter.  RN Health Coach will make an outreach attempt to the patient within four business days.  [name] RN, BSN, [place] Direct Dial:  [phone]  Fax: [phone]

## 2018-12-21 ENCOUNTER — Other Ambulatory Visit: Payer: Self-pay

## 2018-12-21 ENCOUNTER — Ambulatory Visit (INDEPENDENT_AMBULATORY_CARE_PROVIDER_SITE_OTHER): Payer: Medicare Other

## 2018-12-21 DIAGNOSIS — E538 Deficiency of other specified B group vitamins: Secondary | ICD-10-CM | POA: Diagnosis not present

## 2018-12-21 MED ORDER — CYANOCOBALAMIN 1000 MCG/ML IJ SOLN
1000.0000 ug | Freq: Once | INTRAMUSCULAR | Status: AC
  Administered 2018-12-21: 1000 ug via INTRAMUSCULAR

## 2018-12-23 NOTE — Progress Notes (Signed)
b12 Injection given.   Shawon Denzer J Hawkin Charo, MD  

## 2018-12-24 ENCOUNTER — Other Ambulatory Visit: Payer: Self-pay

## 2018-12-24 NOTE — Patient Outreach (Signed)
[facility]) Care Management  12/24/2018  Cindee Mohr 28-Aug-1945 [id number]    Telephone Screen Referral Date :12/20/18 Referral Source: Doctors referral Referral Reason: elevated blood sugars Insurance:[employer]  Outreach attempt # 2 to patient. Two patient identifiers obtained.  The patient states that she is doing fair today.  Explained to her the reason for my call.  The patient states that she has been out of her medication Jardiance and Victoza.  Her doctor's office had been working on getting her medication approved. She was approved and has had the medication for two weeks.  She states that her blood sugar this morning was 136 and they have been ranging 90-136.  She reports not having an appetite because she has been nauseated.  He B12 level is very low and that has made her sick.  She has dicussed this with her PCP and has medication for nausea.  Discussed with the patient about eating things to settle her stomach such as chicken noodle soup, crackers, and staying away from greasy foods.  She states that she "is doing everything that she can to keep herself healthy.  She will be going to her coumadin appointment this Friday as well as getting her 2nd B12 shot.  She will continue to get shots for two more weeks that go to monthly.  Notified the patient that I will be transitioning to another job and one of my colleague will follow up with her.     Plan: RN Health Coach will follow up with the patient at the next scheduled interval.  [name] RN, BSN, [place]:  [phone] Fax: [phone]

## 2018-12-25 ENCOUNTER — Encounter: Payer: Self-pay | Admitting: Internal Medicine

## 2018-12-26 ENCOUNTER — Encounter: Payer: Self-pay | Admitting: Internal Medicine

## 2018-12-26 MED ORDER — SPIRONOLACTONE 25 MG PO TABS: 25.0000 mg | ORAL_TABLET | Freq: Two times a day (BID) | ORAL | 0 refills | Status: DC

## 2018-12-28 ENCOUNTER — Ambulatory Visit (INDEPENDENT_AMBULATORY_CARE_PROVIDER_SITE_OTHER): Payer: Medicare Other | Admitting: General Practice

## 2018-12-28 ENCOUNTER — Other Ambulatory Visit: Payer: Self-pay

## 2018-12-28 ENCOUNTER — Ambulatory Visit: Payer: Medicare Other

## 2018-12-28 DIAGNOSIS — I480 Paroxysmal atrial fibrillation: Secondary | ICD-10-CM

## 2018-12-28 DIAGNOSIS — E538 Deficiency of other specified B group vitamins: Secondary | ICD-10-CM

## 2018-12-28 DIAGNOSIS — Z7901 Long term (current) use of anticoagulants: Secondary | ICD-10-CM | POA: Diagnosis not present

## 2018-12-28 LAB — RETICULOCYTES
ABS Retic: 53760 cells/uL (ref 20000–8000)
Retic Ct Pct: 2.1 %

## 2018-12-28 LAB — POCT INR: INR: 1.9 — AB (ref 2.0–3.0)

## 2018-12-28 LAB — VITAMIN B1: Vitamin B1 (Thiamine): 8 nmol/L (ref 8–30)

## 2018-12-28 MED ORDER — CYANOCOBALAMIN 1000 MCG/ML IJ SOLN
1000.0000 ug | Freq: Once | INTRAMUSCULAR | Status: AC
  Administered 2018-12-28: 1000 ug via INTRAMUSCULAR

## 2018-12-28 NOTE — Patient Instructions (Addendum)
Pre visit review using our clinic review tool, if applicable. No additional management support is needed unless otherwise documented below in the visit note.  Take 3 tablets (9 mg) today and then continue to take 1 tablet daily except 2 tablets on Mon Wed and Fridays.  Re-check in 4 weeks.

## 2018-12-28 NOTE — Progress Notes (Signed)
[facility] screening examination/treatment/procedure(s) were performed by non-physician practitioner and as supervising physician I was immediately available for consultation/collaboration. I agree with above. James John, MD   

## 2018-12-30 ENCOUNTER — Other Ambulatory Visit: Payer: Self-pay | Admitting: Internal Medicine

## 2018-12-30 ENCOUNTER — Encounter: Payer: Self-pay | Admitting: Internal Medicine

## 2018-12-30 DIAGNOSIS — E519 Thiamine deficiency, unspecified: Secondary | ICD-10-CM | POA: Insufficient documentation

## 2018-12-30 MED ORDER — VITAMIN B-1 100 MG PO TABS: 100.0000 mg | ORAL_TABLET | Freq: Every day | ORAL | 1 refills | Status: DC

## 2018-12-31 ENCOUNTER — Other Ambulatory Visit: Payer: Self-pay | Admitting: Internal Medicine

## 2018-12-31 ENCOUNTER — Telehealth: Payer: Self-pay

## 2018-12-31 DIAGNOSIS — K219 Gastro-esophageal reflux disease without esophagitis: Secondary | ICD-10-CM

## 2018-12-31 DIAGNOSIS — I2510 Atherosclerotic heart disease of native coronary artery without angina pectoris: Secondary | ICD-10-CM

## 2018-12-31 DIAGNOSIS — E1151 Type 2 diabetes mellitus with diabetic peripheral angiopathy without gangrene: Secondary | ICD-10-CM

## 2018-12-31 MED ORDER — ONDANSETRON 4 MG PO TBDP: 4.0000 mg | ORAL_TABLET | Freq: Three times a day (TID) | ORAL | 2 refills | Status: DC | PRN

## 2018-12-31 MED ORDER — JARDIANCE 25 MG PO TABS: 25.0000 mg | ORAL_TABLET | Freq: Every day | ORAL | 3 refills | Status: DC

## 2018-12-31 NOTE — Telephone Encounter (Signed)
Patient completed Patient Assistance form.   Prescriber info completed and given to PCP to sign, rx printed for PCP to sign.

## 2019-01-01 ENCOUNTER — Telehealth: Payer: Self-pay

## 2019-01-01 NOTE — Telephone Encounter (Signed)
Previously a my chart message was sent and pt requested a letter excluding her from jury duty.

## 2019-01-03 ENCOUNTER — Telehealth: Payer: Self-pay

## 2019-01-03 ENCOUNTER — Ambulatory Visit (INDEPENDENT_AMBULATORY_CARE_PROVIDER_SITE_OTHER): Payer: Medicare Other

## 2019-01-03 ENCOUNTER — Other Ambulatory Visit: Payer: Self-pay

## 2019-01-03 DIAGNOSIS — E538 Deficiency of other specified B group vitamins: Secondary | ICD-10-CM

## 2019-01-03 DIAGNOSIS — E1151 Type 2 diabetes mellitus with diabetic peripheral angiopathy without gangrene: Secondary | ICD-10-CM

## 2019-01-03 MED ORDER — TOUJEO MAX SOLOSTAR 300 UNIT/ML ~~LOC~~ SOPN: 50.0000 [IU] | PEN_INJECTOR | Freq: Every day | SUBCUTANEOUS | 3 refills | Status: DC

## 2019-01-03 MED ORDER — CYANOCOBALAMIN 1000 MCG/ML IJ SOLN
1000.0000 ug | Freq: Once | INTRAMUSCULAR | Status: AC
  Administered 2019-01-03: 1000 ug via INTRAMUSCULAR

## 2019-01-03 NOTE — Progress Notes (Signed)
I have reviewed and agree.

## 2019-01-03 NOTE — Telephone Encounter (Signed)
Per dr Ronnald Ramp, patient needs to come for once weekly b12 injections for 3 weeks, then once monthly----patient will be reminded today at nurse visit

## 2019-01-04 NOTE — Telephone Encounter (Signed)
Patient assistance for [facility] faxed.

## 2019-01-06 ENCOUNTER — Other Ambulatory Visit: Payer: Self-pay | Admitting: Internal Medicine

## 2019-01-07 ENCOUNTER — Other Ambulatory Visit: Payer: Self-pay | Admitting: Internal Medicine

## 2019-01-08 ENCOUNTER — Other Ambulatory Visit: Payer: Self-pay | Admitting: Internal Medicine

## 2019-01-08 ENCOUNTER — Encounter: Payer: Self-pay | Admitting: Internal Medicine

## 2019-01-08 DIAGNOSIS — Z7901 Long term (current) use of anticoagulants: Secondary | ICD-10-CM

## 2019-01-08 DIAGNOSIS — E1169 Type 2 diabetes mellitus with other specified complication: Secondary | ICD-10-CM

## 2019-01-08 MED ORDER — WARFARIN SODIUM 3 MG PO TABS: ORAL_TABLET | ORAL | 2 refills | Status: DC

## 2019-01-08 MED ORDER — ATORVASTATIN CALCIUM 40 MG PO TABS: 40.0000 mg | ORAL_TABLET | Freq: Every day | ORAL | 1 refills | Status: DC

## 2019-01-11 ENCOUNTER — Ambulatory Visit: Payer: Medicare Other

## 2019-01-16 ENCOUNTER — Encounter: Payer: Self-pay | Admitting: Internal Medicine

## 2019-01-16 DIAGNOSIS — Z7901 Long term (current) use of anticoagulants: Secondary | ICD-10-CM

## 2019-01-17 DIAGNOSIS — G894 Chronic pain syndrome: Secondary | ICD-10-CM | POA: Diagnosis not present

## 2019-01-17 DIAGNOSIS — M25512 Pain in left shoulder: Secondary | ICD-10-CM | POA: Diagnosis not present

## 2019-01-17 DIAGNOSIS — Z79891 Long term (current) use of opiate analgesic: Secondary | ICD-10-CM | POA: Diagnosis not present

## 2019-01-17 DIAGNOSIS — M25511 Pain in right shoulder: Secondary | ICD-10-CM | POA: Diagnosis not present

## 2019-01-17 MED ORDER — WARFARIN SODIUM 3 MG PO TABS: ORAL_TABLET | ORAL | 2 refills | Status: DC

## 2019-01-25 ENCOUNTER — Ambulatory Visit: Payer: Medicare Other

## 2019-01-25 ENCOUNTER — Telehealth: Payer: Self-pay | Admitting: Internal Medicine

## 2019-01-25 NOTE — Telephone Encounter (Signed)
[facility] is calling to have Cindy to schedule Coumadin Clinic.  CB- [phone]

## 2019-02-04 ENCOUNTER — Other Ambulatory Visit: Payer: Self-pay

## 2019-02-04 ENCOUNTER — Ambulatory Visit (INDEPENDENT_AMBULATORY_CARE_PROVIDER_SITE_OTHER): Payer: Medicare Other

## 2019-02-04 DIAGNOSIS — E538 Deficiency of other specified B group vitamins: Secondary | ICD-10-CM

## 2019-02-04 MED ORDER — CYANOCOBALAMIN 1000 MCG/ML IJ SOLN
1000.0000 ug | Freq: Once | INTRAMUSCULAR | Status: AC
  Administered 2019-02-04: 15:00:00 1000 ug via INTRAMUSCULAR

## 2019-02-04 NOTE — Progress Notes (Signed)
I have reviewed and agree.

## 2019-02-06 ENCOUNTER — Encounter: Payer: Self-pay | Admitting: Internal Medicine

## 2019-02-08 ENCOUNTER — Ambulatory Visit (INDEPENDENT_AMBULATORY_CARE_PROVIDER_SITE_OTHER): Payer: Medicare Other | Admitting: General Practice

## 2019-02-08 ENCOUNTER — Other Ambulatory Visit: Payer: Self-pay

## 2019-02-08 DIAGNOSIS — I480 Paroxysmal atrial fibrillation: Secondary | ICD-10-CM

## 2019-02-08 DIAGNOSIS — Z7901 Long term (current) use of anticoagulants: Secondary | ICD-10-CM

## 2019-02-08 LAB — POCT INR: INR: 3.7 — AB (ref 2.0–3.0)

## 2019-02-08 NOTE — Progress Notes (Signed)
Medical screening examination/treatment/procedure(s) were performed by non-physician practitioner and as supervising physician I was immediately available for consultation/collaboration. I agree with above. James John, MD   

## 2019-02-08 NOTE — Patient Instructions (Signed)
Pre visit review using our clinic review tool, if applicable. No additional management support is needed unless otherwise documented below in the visit note. Skip dosage today and then continue to take 1 tablet daily except 2 tablets on Mon Wed and Fridays. Add 2 to 3 servings of greens per week.  Re-check in 3 weeks.

## 2019-02-14 ENCOUNTER — Other Ambulatory Visit: Payer: Self-pay | Admitting: *Deleted

## 2019-02-14 ENCOUNTER — Ambulatory Visit: Payer: Medicare Other

## 2019-02-14 DIAGNOSIS — Z79891 Long term (current) use of opiate analgesic: Secondary | ICD-10-CM | POA: Diagnosis not present

## 2019-02-14 DIAGNOSIS — M25512 Pain in left shoulder: Secondary | ICD-10-CM | POA: Diagnosis not present

## 2019-02-14 DIAGNOSIS — M25511 Pain in right shoulder: Secondary | ICD-10-CM | POA: Diagnosis not present

## 2019-02-14 DIAGNOSIS — G894 Chronic pain syndrome: Secondary | ICD-10-CM | POA: Diagnosis not present

## 2019-02-14 NOTE — Patient Outreach (Signed)
[facility]) Care Management  02/14/2019  Kristen [name] 10-11-1945 [id number]   RN Health Coach Monthly Outreach  Referral Date:  01/02/2018 Referral Source:  MD Referral Screening Reason for Referral:  Disease Management Education Insurance:  [employer] Medicare   Outreach Attempt:  Outreach attempt #1 to patient for follow up. No answer. RN Health Coach left HIPAA compliant voicemail message along with contact information.  Plan:  RN Health Coach will make another outreach attempt within the month of January if no return call back from patient.   [place] [phone] [name]

## 2019-02-16 ENCOUNTER — Other Ambulatory Visit: Payer: Self-pay | Admitting: Internal Medicine

## 2019-02-16 DIAGNOSIS — E1159 Type 2 diabetes mellitus with other circulatory complications: Secondary | ICD-10-CM

## 2019-02-16 MED ORDER — FUROSEMIDE 20 MG PO TABS: 20.0000 mg | ORAL_TABLET | Freq: Every day | ORAL | 0 refills | Status: DC

## 2019-02-26 ENCOUNTER — Other Ambulatory Visit: Payer: Self-pay

## 2019-02-27 ENCOUNTER — Ambulatory Visit (HOSPITAL_COMMUNITY): Payer: Medicare Other | Admitting: Psychiatry

## 2019-02-27 ENCOUNTER — Ambulatory Visit (INDEPENDENT_AMBULATORY_CARE_PROVIDER_SITE_OTHER): Payer: Medicare Other | Admitting: General Practice

## 2019-02-27 DIAGNOSIS — Z7901 Long term (current) use of anticoagulants: Secondary | ICD-10-CM

## 2019-02-27 DIAGNOSIS — I480 Paroxysmal atrial fibrillation: Secondary | ICD-10-CM

## 2019-02-27 LAB — POCT INR: INR: 4.8 — AB (ref 2.0–3.0)

## 2019-02-27 NOTE — Patient Instructions (Addendum)
Pre visit review using our clinic review tool, if applicable. No additional management support is needed unless otherwise documented below in the visit note.  Skip dosage today and tomorrow and then change dosage and take 1 tablet daily except 2 tablets on Mon and Fridays. Re-check in 2 weeks.

## 2019-02-27 NOTE — Progress Notes (Signed)
I have reviewed the results and agree with this plan   

## 2019-03-05 ENCOUNTER — Encounter: Payer: Self-pay | Admitting: Internal Medicine

## 2019-03-06 ENCOUNTER — Ambulatory Visit (INDEPENDENT_AMBULATORY_CARE_PROVIDER_SITE_OTHER): Payer: Medicare Other

## 2019-03-06 ENCOUNTER — Other Ambulatory Visit: Payer: Self-pay

## 2019-03-06 DIAGNOSIS — E538 Deficiency of other specified B group vitamins: Secondary | ICD-10-CM | POA: Diagnosis not present

## 2019-03-06 MED ORDER — CYANOCOBALAMIN 1000 MCG/ML IJ SOLN
1000.0000 ug | Freq: Once | INTRAMUSCULAR | Status: AC
  Administered 2019-03-06: 15:00:00 1000 ug via INTRAMUSCULAR

## 2019-03-06 NOTE — Progress Notes (Signed)
I have reviewed and agree.

## 2019-03-11 ENCOUNTER — Other Ambulatory Visit: Payer: Self-pay | Admitting: Internal Medicine

## 2019-03-11 DIAGNOSIS — E1151 Type 2 diabetes mellitus with diabetic peripheral angiopathy without gangrene: Secondary | ICD-10-CM

## 2019-03-11 DIAGNOSIS — I2510 Atherosclerotic heart disease of native coronary artery without angina pectoris: Secondary | ICD-10-CM

## 2019-03-11 DIAGNOSIS — E1159 Type 2 diabetes mellitus with other circulatory complications: Secondary | ICD-10-CM

## 2019-03-11 MED ORDER — TELMISARTAN 40 MG PO TABS: 40.0000 mg | ORAL_TABLET | Freq: Every day | ORAL | 1 refills | Status: DC

## 2019-03-12 ENCOUNTER — Encounter: Payer: Self-pay | Admitting: Internal Medicine

## 2019-03-12 ENCOUNTER — Other Ambulatory Visit: Payer: Self-pay | Admitting: Internal Medicine

## 2019-03-12 ENCOUNTER — Ambulatory Visit (INDEPENDENT_AMBULATORY_CARE_PROVIDER_SITE_OTHER): Payer: Medicare Other | Admitting: Psychiatry

## 2019-03-12 ENCOUNTER — Other Ambulatory Visit: Payer: Self-pay

## 2019-03-12 DIAGNOSIS — F411 Generalized anxiety disorder: Secondary | ICD-10-CM

## 2019-03-12 DIAGNOSIS — E1151 Type 2 diabetes mellitus with diabetic peripheral angiopathy without gangrene: Secondary | ICD-10-CM

## 2019-03-12 MED ORDER — METFORMIN HCL ER 500 MG PO TB24: 1000.0000 mg | ORAL_TABLET | Freq: Two times a day (BID) | ORAL | 3 refills | Status: DC

## 2019-03-12 MED ORDER — ALPRAZOLAM 1 MG PO TABS: 1.0000 mg | ORAL_TABLET | Freq: Three times a day (TID) | ORAL | 5 refills | Status: DC | PRN

## 2019-03-12 NOTE — Progress Notes (Signed)
She is only fair. None Patient ID: Kristen [name], female   DOB: 1945-11-11, 74 y.o.   MRN: [id number] [facility] MD Progress Note  03/12/2019 4:20 PM Kristen [name]  MRN:  [id number] Subjective:  Scared Principal Problem: Adjustment disorder with an anxious mood state  Patient is doing very well.  She is very cautious.  Her health is good.  The only problem is her diabetes is not well controlled.  She is had no falls.  Her mood is great.  She sleeps and eats well.  She is got good energy.  She enjoys reading and watching TV.  She is clinically active.  Her thoughts are clear and organized.  She uses no alcohol uses no drugs.  Patient's anxiety is well controlled taking a fixed dose of Xanax which she has been on for over a decade.  Efforts to remove the Xanax has led to a distinct decline.  At this time she will continue to this probably indefinitely.  She is very stable.  She denies daily depression, psychotic symptoms or any level of distress.  Her husband live safely and quietly.   Past Medical History:  Diagnosis Date  . Arthritis   . Blood transfusion without reported diagnosis   . Bursitis   . CAD (coronary artery disease)    a. s/p CABG 2004.b. stable cath 2014 demonstrating stable CAD and continued patency of her LIMA graft.  . Chronic anticoagulation    on coumadin  . DDD (degenerative disc disease), lumbar   . Depression   . Diabetes mellitus   . Diastolic dysfunction    per echo in October 2012 with EF 50 to 55%  . Fibromyalgia   . GERD (gastroesophageal reflux disease) 10/23/2003  . Headache(784.0)   . Hyperlipidemia   . Hypertension   . Hypokalemia   . LBBB (left bundle branch block)   . Lumbar back pain   . LV dysfunction    a. EF 45% by cath 2014. b. EF 50-55% by technically difficult echo in 08/2014.  Marland Kitchen Lymphadenitis   . Morbid obesity ([place])    a. Sleep study negative for significant OSA in 11/2014.  Marland Kitchen PAF (paroxysmal atrial fibrillation) ([place])   . Stroke  [facility]) 2004   affected speech per pt    Past Surgical History:  Procedure Laterality Date  . ABDOMINAL HYSTERECTOMY    . ANGIOPLASTY  laminectomy  . CHOLECYSTECTOMY    . CORONARY ARTERY BYPASS GRAFT     LIMA to LAD   . KNEE ARTHROSCOPY    . LEFT HEART CATHETERIZATION WITH CORONARY/GRAFT ANGIOGRAM  12/07/2012   Procedure: LEFT HEART CATHETERIZATION WITH Beatrix Fetters;  Surgeon: Blane Ohara, MD;  Location: [facility] CATH LAB;  Service: Cardiovascular;;  . LUMBAR LAMINECTOMY     x3  . SPHINCTEROTOMY    . TONSILLECTOMY     Family History:  Family History  Problem Relation Age of Onset  . Heart attack Father   . Heart disease Father   . Stroke Mother   . Kidney disease Other   . Stroke Other   . Arthritis Other   . Hypertension Other   . Diabetes Other   . Colon cancer Paternal Uncle   . Depression Sister   . Anxiety disorder Maternal Aunt    Family Psychiatric  History:  Social History:  Social History   Substance and Sexual Activity  Alcohol Use No     Social History   Substance and Sexual Activity  Drug Use  No    Social History   Socioeconomic History  . Marital status: Married    Spouse name: Not on file  . Number of children: Not on file  . Years of education: Not on file  . Highest education level: Not on file  Occupational History  . Not on file  Tobacco Use  . Smoking status: Former Smoker    Quit date: 03/07/2001    Years since quitting: 18.0  . Smokeless tobacco: Never Used  Substance and Sexual Activity  . Alcohol use: No  . Drug use: No  . Sexual activity: Not Currently  Other Topics Concern  . Not on file  Social History Narrative   Lives locally, has help available if needed.   Social Determinants of Health   Financial Resource Strain:   . Difficulty of Paying Living Expenses: Not on file  Food Insecurity:   . Worried About Charity fundraiser in the Last Year: Not on file  . Ran Out of Food in the Last Year: Not on file   Transportation Needs:   . Lack of Transportation (Medical): Not on file  . Lack of Transportation (Non-Medical): Not on file  Physical Activity:   . Days of Exercise per Week: Not on file  . Minutes of Exercise per Session: Not on file  Stress:   . Feeling of Stress : Not on file  Social Connections:   . Frequency of Communication with Friends and Family: Not on file  . Frequency of Social Gatherings with Friends and Family: Not on file  . Attends Religious Services: Not on file  . Active Member of Clubs or Organizations: Not on file  . Attends Archivist Meetings: Not on file  . Marital Status: Not on file   Additional Social History:                         Sleep: Fair  Appetite:  Fair  Current Medications: Current Outpatient Medications  Medication Sig Dispense Refill  . ALPRAZolam (XANAX) 1 MG tablet Take 1 tablet (1 mg total) by mouth 3 (three) times daily as needed for anxiety. 90 tablet 5  . atorvastatin (LIPITOR) 40 MG tablet Take 1 tablet (40 mg total) by mouth daily. 90 tablet 1  . Blood Glucose Monitoring Suppl (ONE TOUCH ULTRA 2) w/Device KIT Use as advised 1 each 0  . cyclobenzaprine (FLEXERIL) 10 MG tablet TK 1 T PO TID PRN    . empagliflozin (JARDIANCE) 25 MG TABS tablet Take 25 mg by mouth daily. 90 tablet 3  . furosemide (LASIX) 20 MG tablet Take 1 tablet (20 mg total) by mouth daily. 90 tablet 0  . gabapentin (NEURONTIN) 300 MG capsule TAKE ONE CAPSULE BY MOUTH THREE TIMES DAILY 90 capsule 5  . glipiZIDE (GLUCOTROL) 5 MG tablet TAKE 2 TABLETS BY MOUTH TWICE DAILY BEFORE MEALS 360 tablet 1  . Insulin Glargine, 2 Unit Dial, (TOUJEO MAX SOLOSTAR) 300 UNIT/ML SOPN Inject 50 Units into the skin daily. 45 mL 3  . Insulin Pen Needle (NOVOFINE) 32G X 6 MM MISC 1 Act by Does not apply route daily. 100 each 3  . liraglutide (VICTOZA) 18 MG/3ML SOPN Inject 0.3 mLs (1.8 mg total) into the skin every morning. 27 mL 3  . MAGNESIUM-OXIDE 400 (241.3 Mg) MG  tablet TAKE 1 TABLET BY MOUTH TWICE DAILY 60 tablet 9  . metFORMIN (GLUCOPHAGE-XR) 500 MG 24 hr tablet Take 2 tablets (1,000 mg  total) by mouth 2 (two) times daily with a meal. 360 tablet 3  . metoprolol succinate (TOPROL-XL) 100 MG 24 hr tablet Take 1 tablet (100 mg total) by mouth 2 (two) times daily. Take with or immediately following a meal. 180 tablet 3  . nitroGLYCERIN (NITROSTAT) 0.4 MG SL tablet Place 1 tablet (0.4 mg total) under the tongue every 5 (five) minutes as needed for chest pain (x 3 doses). Reported on 05/08/2015 35 tablet 2  . ondansetron (ZOFRAN-ODT) 4 MG disintegrating tablet Take 1 tablet (4 mg total) by mouth every 8 (eight) hours as needed for nausea or vomiting. 60 tablet 2  . ONE TOUCH ULTRA TEST test strip USE TO TEST BLOOD SUGAR FOUR TIMES DAILY AS DIRECTED 300 each 2  . potassium chloride SA (K-DUR) 20 MEQ tablet Take 1 tablet (20 mEq total) by mouth 2 (two) times daily. 180 tablet 3  . spironolactone (ALDACTONE) 25 MG tablet Take 1 tablet (25 mg total) by mouth 2 (two) times daily. 180 tablet 0  . telmisartan (MICARDIS) 40 MG tablet Take 1 tablet (40 mg total) by mouth daily. 90 tablet 1  . thiamine (VITAMIN B-1) 100 MG tablet Take 1 tablet (100 mg total) by mouth daily. 45 tablet 1  . triamcinolone cream (KENALOG) 0.5 % Apply 1 application topically 3 (three) times daily. 60 g 3  . warfarin (COUMADIN) 3 MG tablet Take 1 tablet daily or As directed by anticoagulation clinic 60 tablet 2   No current facility-administered medications for this visit.    Lab Results:  No results found. However, due to the size of the patient record, not all encounters were searched. Please check Results Review for a complete set of results.  Physical Findings: AIMS:  , ,  ,  ,    CIWA:    COWS:     Musculoskeletal: Strength & Muscle Tone: within normal limits Gait & Station: normal Patient leans: Right  Psychiatric Specialty Exam: ROS  There were no vitals taken for this  visit.There is no height or weight on file to calculate BMI.  General Appearance: NA  Eye Contact::  Good  Speech:  Clear and Coherent  Volume:  Normal  Mood:  NA  Affect:  Congruent  Thought Process:  Coherent  Orientation:  Full (Time, Place, and Person)  Thought Content:  WDL  Suicidal Thoughts:  No  Homicidal Thoughts:  No  Memory:  NA  Judgement:  Good  Insight:  Good  Psychomotor Activity:  Normal  Concentration:  Good  Recall:  Good  Fund of Knowledge:Fair  Language: Good  Akathisia:  No  Handed:  Left  AIMS (if indicated):     Assets:  Desire for Improvement  ADL's:  Intact  Cognition: WNL  Sleep:      Treatment Plan Summary:  This patient is diagnosis is generalized anxiety disorder which is well controlled with Xanax 1 mg 4 times daily.  She is functioning very well.  She uses no drugs and no alcohol.  She is very very stable.  We will check in on her again in about 5 months.

## 2019-03-12 NOTE — Telephone Encounter (Signed)
Patient was lost for follow-up for me and she is followed by PCP for her diabetes.

## 2019-03-12 NOTE — Telephone Encounter (Signed)
Last office visit 01/15/2018  Cancel/No-show? 3  Future office visit scheduled? no  Please advise on refill.

## 2019-03-13 ENCOUNTER — Ambulatory Visit: Payer: Medicare Other

## 2019-03-13 DIAGNOSIS — G894 Chronic pain syndrome: Secondary | ICD-10-CM | POA: Diagnosis not present

## 2019-03-13 DIAGNOSIS — M25512 Pain in left shoulder: Secondary | ICD-10-CM | POA: Diagnosis not present

## 2019-03-13 DIAGNOSIS — M25511 Pain in right shoulder: Secondary | ICD-10-CM | POA: Diagnosis not present

## 2019-03-13 DIAGNOSIS — Z79891 Long term (current) use of opiate analgesic: Secondary | ICD-10-CM | POA: Diagnosis not present

## 2019-03-14 ENCOUNTER — Ambulatory Visit: Payer: Medicare Other | Admitting: Internal Medicine

## 2019-03-15 ENCOUNTER — Ambulatory Visit: Payer: Medicare Other

## 2019-03-18 ENCOUNTER — Encounter: Payer: Self-pay | Admitting: Internal Medicine

## 2019-03-20 ENCOUNTER — Encounter: Payer: Self-pay | Admitting: *Deleted

## 2019-03-20 ENCOUNTER — Other Ambulatory Visit: Payer: Self-pay | Admitting: *Deleted

## 2019-03-20 NOTE — Patient Outreach (Signed)
[place] [facility]) Care Management  [place]  03/21/2019   Kristen [name] [birth year] [id number]   RN Health Coach Monthly Outreach   Referral Date:  01/02/2018 Referral Source:  MD Referral Screening Reason for Referral:  Disease Management Education Insurance:  [employer] Medicare   Outreach Attempt:  Successful telephone outreach to patient for follow up.  HIPAA verified with patient.  Patient expressing frustration with her multiple cor morbidities and difficulties managing them.  Expressing how hard it is to manage diabetic diet with limiting vegetables for coumadin diet. Educated and discussed vegetables on coumadin diet should not be avoided but rather consistent so dose of coumadin could be managed.  Fasting blood sugar this morning was 133 with last Hgb A1C 9 on 12/19/2018.  Patient also stating her exercise/activity is limited due to her pain and arthritis in her knees.  Discussed chair exercises and increasing activity as tolerated with fall precautions and preventions.  Verbalizes she will have trouble affording her diabetes medications ([place]je[place], Victoza, and [place]).  Has medication assistance applications but unsure if she can apply for all 3.  [place] referral discussed and patient verbally agrees.  Discussed diagnosis/history of heart failure and importance of daily weight monitoring.  Encouraged patient to do so.  Encounter Medications:  Outpatient Encounter Medications as of 03/20/2019  Medication Sig Note  . ALPRAZolam (XANAX) 1 MG tablet Take 1 tablet (1 mg total) by mouth 3 (three) times daily as needed for anxiety.   Marland Kitchen atorvastatin (LIPITOR) 40 MG tablet Take 1 tablet (40 mg total) by mouth daily.   . cyclobenzaprine (FLEXERIL) 10 MG tablet TK 1 T PO TID PRN   . empagliflozin (JARDIANCE) 25 MG TABS tablet Take 25 mg by mouth daily.   . furosemide (LASIX) 20 MG tablet Take 1 tablet (20 mg total) by mouth daily.   Marland Kitchen gabapentin  (NEURONTIN) 300 MG capsule TAKE ONE CAPSULE BY MOUTH THREE TIMES DAILY   . glipiZIDE (GLUCOTROL) 5 MG tablet TAKE 2 TABLETS BY MOUTH TWICE DAILY BEFORE MEALS   . Insulin Glargine, 2 Unit Dial, (TOUJEO MAX SOLOSTAR) 300 UNIT/ML SOPN Inject 50 Units into the skin daily.   Marland Kitchen liraglutide (VICTOZA) 18 MG/3ML SOPN Inject 0.3 mLs (1.8 mg total) into the skin every morning.   Marland Kitchen MAGNESIUM-OXIDE 400 (241.3 Mg) MG tablet TAKE 1 TABLET BY MOUTH TWICE DAILY   . metFORMIN (GLUCOPHAGE-XR) 500 MG 24 hr tablet Take 2 tablets (1,000 mg total) by mouth 2 (two) times daily with a meal.   . metoprolol succinate (TOPROL-XL) 100 MG 24 hr tablet Take 1 tablet (100 mg total) by mouth 2 (two) times daily. Take with or immediately following a meal.   . nitroGLYCERIN (NITROSTAT) 0.4 MG SL tablet Place 1 tablet (0.4 mg total) under the tongue every 5 (five) minutes as needed for chest pain (x 3 doses). Reported on 05/08/2015   . ondansetron (ZOFRAN-ODT) 4 MG disintegrating tablet Take 1 tablet (4 mg total) by mouth every 8 (eight) hours as needed for nausea or vomiting.   Marland Kitchen PERCOCET 10-325 MG tablet Take 1 tablet by mouth 5 (five) times daily as needed.   . potassium chloride SA (K-DUR) 20 MEQ tablet Take 1 tablet (20 mEq total) by mouth 2 (two) times daily.   Marland Kitchen spironolactone (ALDACTONE) 25 MG tablet Take 1 tablet (25 mg total) by mouth 2 (two) times daily.   Marland Kitchen telmisartan (MICARDIS) 40 MG tablet Take 1 tablet (40 mg total) by mouth  daily.   . warfarin (COUMADIN) 3 MG tablet Take 1 tablet daily or As directed by anticoagulation clinic   . Blood Glucose Monitoring Suppl (ONE TOUCH ULTRA 2) w/Device KIT Use as advised   . Insulin Pen Needle (NOVOFINE) 32G X 6 MM MISC 1 Act by Does not apply route daily.   . ONE TOUCH ULTRA TEST test strip USE TO TEST BLOOD SUGAR FOUR TIMES DAILY AS DIRECTED   . thiamine (VITAMIN B-1) 100 MG tablet Take 1 tablet (100 mg total) by mouth daily. (Patient not taking: Reported on 03/20/2019) 03/20/2019:  Needs refill  . triamcinolone cream (KENALOG) 0.5 % Apply 1 application topically 3 (three) times daily.    No facility-administered encounter medications on file as of 03/20/2019.    Functional Status:  No flowsheet data found.  Fall/Depression Screening: Fall Risk  03/20/2019 12/24/2018 11/15/2018  Falls in the past year? 0 0 0  Number falls in past yr: - 0 -  Risk for fall due to : Medication side effect;Impaired balance/gait;Impaired mobility;Orthopedic patient - -  Follow up Falls prevention discussed;Education provided;Falls evaluation completed - -   PHQ 2/9 Scores 07/05/2018 02/16/2018 01/02/2018 12/29/2017 08/07/2017 10/12/2016 09/01/2015  PHQ - 2 Score 1 1 0 0 0 1 0  PHQ- 9 Score 10 - - - 2 5 -   THN CM Care Plan Problem One     Most Recent Value  Care Plan Problem One  Knowledge and financial deficiets related to self care management of diabetes and heart failure  Role Documenting the Problem One  [place] for Problem One  Active  THN Long Term Goal   Patient will report a decrease in A1C by 0.2 points within the next 90 days.  THN Long Term Goal Start Date  03/20/19  Interventions for Problem One Long Term Goal  Care Plan and goals reviewed and discussed with patient, reviewed current Hgb A1C and discussed ways to help reduce, discussed and encouraged diabetic coumadin diet, sending EMMI Coumadin Diet, encouraged increase activity as tolerated, sending EMMI on Chair exercises, discussed history of heart failure and importance of daily weight monitoring, encouraged to weigh daily, sending Living Better with Heart Failure Educational Packet, sending 2021 Calendar Booklet to help document vitals, [place] referral for medication assistance and diet education, reviewed medications and indications and encouraged medication compliance, encouraged to keep and attend scheduled medical appointments, discussed foods rich in vitamin K and the need to be consistent with eating  such foods while on coumadin     Appointments:  Last attended appointment with primary care provider, Dr. Ronnald Ramp on 12/19/2018 and has scheduled follow up on 04/09/2019.  Attended appointment with Cardiologist, Dr. Burt Knack on 10/22/2018.  Plan: RN Health Coach will send primary care provider quarterly update. RN Health Coach will send patient [facility]. RN Health Coach will send patient Living Better with Heart Failure Educational Packet. RN Health Coach will send patient EMMI Lower Extremity Exercises Seated. RN Health Coach will send patient EMMI Upper Extremity Exercises Seated, General. RN Health Coach will send patient EMMI Vitamin K Diet. RN Health Coach will send patient 2021 Calendar Booklet. RN Health Coach will consult [place] for medication assistance and Coumadin Diet education. RN Health Coach will make next telephone outreach to patient within the month of February and patient agrees to future outreach.  [place] [phone] [email or web address]

## 2019-03-21 ENCOUNTER — Other Ambulatory Visit: Payer: Self-pay | Admitting: Pharmacist

## 2019-03-21 DIAGNOSIS — H25813 Combined forms of age-related cataract, bilateral: Secondary | ICD-10-CM | POA: Diagnosis not present

## 2019-03-21 DIAGNOSIS — H04123 Dry eye syndrome of bilateral lacrimal glands: Secondary | ICD-10-CM | POA: Diagnosis not present

## 2019-03-21 DIAGNOSIS — H5213 Myopia, bilateral: Secondary | ICD-10-CM | POA: Diagnosis not present

## 2019-03-21 DIAGNOSIS — E119 Type 2 diabetes mellitus without complications: Secondary | ICD-10-CM | POA: Diagnosis not present

## 2019-03-21 LAB — HM DIABETES EYE EXAM

## 2019-03-21 NOTE — Patient Outreach (Signed)
[place] [facility]) Care Management  [place]   03/21/2019  Valencia Rauch Feb 12, 1946 [id number]  Reason for referral: Medication Assistance, Medication Management Nelva Nay, Victoza, Jardiance)  Referral source: [place] Coach Current insurance: [facility]   Outreach:  Unsuccessful telephone call attempt #1 to patient. HIPAA compliant voicemail left requesting a return call  Plan:  -I will mail patient an unsuccessful outreach letter.  -I will make another outreach attempt to patient within 3-4 business days.     Ralene Bathe, PharmD, [place] [phone]

## 2019-03-22 ENCOUNTER — Ambulatory Visit (INDEPENDENT_AMBULATORY_CARE_PROVIDER_SITE_OTHER): Payer: Medicare Other | Admitting: General Practice

## 2019-03-22 ENCOUNTER — Other Ambulatory Visit: Payer: Self-pay

## 2019-03-22 DIAGNOSIS — Z7901 Long term (current) use of anticoagulants: Secondary | ICD-10-CM

## 2019-03-22 DIAGNOSIS — I480 Paroxysmal atrial fibrillation: Secondary | ICD-10-CM

## 2019-03-22 LAB — POCT INR: INR: 2.9 (ref 2.0–3.0)

## 2019-03-22 NOTE — Patient Instructions (Addendum)
.  lbpcmh  Continue to take 1 tablet daily except 2 tablets on Mon and Fridays. Re-check in 4 weeks.

## 2019-03-22 NOTE — Progress Notes (Signed)
Medical screening examination/treatment/procedure(s) were performed by non-physician practitioner and as supervising physician I was immediately available for consultation/collaboration. I agree with above. Reona Zendejas, MD   

## 2019-03-24 ENCOUNTER — Other Ambulatory Visit: Payer: Self-pay | Admitting: Internal Medicine

## 2019-03-26 ENCOUNTER — Other Ambulatory Visit: Payer: Self-pay | Admitting: Pharmacist

## 2019-03-26 ENCOUNTER — Ambulatory Visit: Payer: Self-pay | Admitting: Pharmacist

## 2019-03-26 ENCOUNTER — Encounter: Payer: Self-pay | Admitting: Internal Medicine

## 2019-03-26 NOTE — Patient Outreach (Addendum)
[facility]) Care Management  [place]   03/26/2019  Sora Vrooman Jul 12, 1945 [id number]  Reason for referral: Medication Assistance, Medication Management Nelva Nay, Victoza, Jardiance)  Referral source: [employer] Current insurance: [facility]   PMHx includes but not limited to:  Depression, fibromyalgia, T2DM, HTN, HLD, CHF, CAD s/p CABG '04, GERD, PAF on chronic anticoagulation with coumadin, hx stroke, morbid obesity, hypothyroidism, anemia, vitamin B12 deficiency  Outreach: Successful call with patient regarding medication assistance. HIPAA identifiers verified.     Subjective:  Toujeo: Patient reports she has started patient assistance program applications for Toujeo this year on her own.  She left application and income documents with PCP office to complete provider portion and fax entire application into [employer].  She has not yet spent 2% TROOP (~$750) though.  She received a sample of Toujeo last year from PCP and then was approved for patient assistance program, therefore has not had to have it filled at the pharmacy yet.  She currently has ~3 pens left.   Jardiance: Patient states she applied for Jardiance again through PCP office in late 2020 and was approved through December 2021.  She reports she has ~30 day supply left currently.    Victoza: Patient reports she also left application and income documents with PCP office to complete provider portion and fax in to [employer] when completed.  She is awaiting to hear if she has been approved.  She thinks she will have to pay for 1 month supply as she is on her last pen.    Patient reports she needs to have cataract surgery, knee surgery, hip surgery, and shoulder surgery however is waiting until she receives COVID-19 vaccine.  She states she is currently on waitlist to schedule vaccine through [facility] on Friday.   Objective: The ASCVD Risk score Mikey Bussing DC Jr., et al.,  2013) failed to calculate for the following reasons:   The patient has a prior MI or stroke diagnosis  Lab Results  Component Value Date   CREATININE 1.01 12/19/2018   CREATININE 0.96 08/14/2018   CREATININE 0.81 07/05/2018    Lab Results  Component Value Date   HGBA1C 9.0 (A) 12/19/2018    Lipid Panel     Component Value Date/Time   CHOL 108 12/26/2017 0758   TRIG 109 12/26/2017 0758   HDL 38 (L) 12/26/2017 0758   CHOLHDL 2.8 12/26/2017 0758   CHOLHDL 2.7 09/01/2015 1344   VLDL 21 09/01/2015 1344   LDLCALC 48 12/26/2017 0758   LDLDIRECT 116.0 11/28/2008 1122    BP Readings from Last 3 Encounters:  12/19/18 118/70  10/22/18 130/80  08/14/18 138/74    Allergies  Allergen Reactions  . Metformin And Related Other (See Comments)    Must take XR form only, cannot tolerate Regular release metformin  . Cleocin [Clindamycin Hcl] Diarrhea  . Codeine Itching  . Doxycycline Hyclate Diarrhea  . Macrolides And Ketolides Diarrhea  . Morphine Hives  . Pentazocine Lactate Itching and Nausea And Vomiting    (GENERIC- [place])  . Vibramycin [Doxycycline Calcium] Diarrhea and Rash  . Definity [Perflutren Lipid Microsphere] Other (See Comments)    Patient complained of warm sensation in chest x few seconds duration when injected Definity.No other symptoms  . Sulfonamide Derivatives Rash    Medications Reviewed Today    Reviewed by Leona Singleton, RN (Registered Nurse) on 03/20/19 at 1642  Med List Status: <None>  Medication Order Taking? Sig Documenting  Provider Last Dose Status Informant  ALPRAZolam (XANAX) 1 MG tablet [id number] Yes Take 1 tablet (1 mg total) by mouth 3 (three) times daily as needed for anxiety. Norma Fredrickson, MD Taking Active   atorvastatin (LIPITOR) 40 MG tablet [id number] Yes Take 1 tablet (40 mg total) by mouth daily. [name], MD Taking Active   Blood Glucose Monitoring Suppl (ONE TOUCH ULTRA 2) w/Device Drucie Opitz [id number]  Use as advised Philemon Kingdom, MD  Active   cyclobenzaprine (FLEXERIL) 10 MG tablet [id number] Yes TK 1 T PO TID PRN [provider] Taking Active   empagliflozin (JARDIANCE) 25 MG TABS tablet [id number] Yes Take 25 mg by mouth daily. [name], MD Taking Active   furosemide (LASIX) 20 MG tablet [id number] Yes Take 1 tablet (20 mg total) by mouth daily. [name], MD Taking Active   gabapentin (NEURONTIN) 300 MG capsule [id number] Yes TAKE ONE CAPSULE BY MOUTH THREE TIMES DAILY [name], MD Taking Active   glipiZIDE (GLUCOTROL) 5 MG tablet [id number] Yes TAKE 2 TABLETS BY MOUTH TWICE DAILY BEFORE MEALS Philemon Kingdom, MD Taking Active   Insulin Glargine, 2 Unit Dial, (TOUJEO MAX SOLOSTAR) 300 UNIT/ML SOPN [id number] Yes Inject 50 Units into the skin daily. [name], MD Taking Active   Insulin Pen Needle (NOVOFINE) 32G X 6 MM MISC [id number]  1 Act by Does not apply route daily. [name], MD  Active   liraglutide (VICTOZA) 18 MG/3ML SOPN [id number] Yes Inject 0.3 mLs (1.8 mg total) into the skin every morning. [name], MD Taking Active   MAGNESIUM-OXIDE 400 (241.3 Mg) MG tablet [id number] Yes TAKE 1 TABLET BY MOUTH TWICE DAILY Sherren Mocha, MD Taking Active   metFORMIN (GLUCOPHAGE-XR) 500 MG 24 hr tablet [id number] Yes Take 2 tablets (1,000 mg total) by mouth 2 (two) times daily with a meal. [name], MD Taking Active   metoprolol succinate (TOPROL-XL) 100 MG 24 hr tablet [id number] Yes Take 1 tablet (100 mg total) by mouth 2 (two) times daily. Take with or immediately following a meal. Sherren Mocha, MD Taking Active   nitroGLYCERIN (NITROSTAT) 0.4 MG SL tablet [id number] Yes Place 1 tablet (0.4 mg total) under the tongue every 5 (five) minutes as needed for chest pain (x 3 doses). Reported on 05/08/2015 [name], MD Taking Active   ondansetron (ZOFRAN-ODT) 4 MG disintegrating tablet [id number] Yes Take 1 tablet (4 mg total) by mouth every 8 (eight) hours as needed  for nausea or vomiting. [name], MD Taking Active   ONE TOUCH ULTRA TEST test strip [id number]  USE TO TEST BLOOD SUGAR FOUR TIMES DAILY AS DIRECTED Philemon Kingdom, MD  Active   PERCOCET 10-325 MG tablet [id number] Yes Take 1 tablet by mouth 5 (five) times daily as needed. [provider] Taking Active   potassium chloride SA (K-DUR) 20 MEQ tablet [id number] Yes Take 1 tablet (20 mEq total) by mouth 2 (two) times daily. Sherren Mocha, MD Taking Active   spironolactone (ALDACTONE) 25 MG tablet [id number] Yes Take 1 tablet (25 mg total) by mouth 2 (two) times daily. [name], MD Taking Active   telmisartan (MICARDIS) 40 MG tablet [id number] Yes Take 1 tablet (40 mg total) by mouth daily. [name], MD Taking Active   thiamine (VITAMIN B-1) 100 MG tablet [id number] No Take 1 tablet (100 mg total) by mouth daily.  Patient not taking: Reported on 03/20/2019   [name], MD Not  Taking Active            Med Note Sung Amabile Mar 20, 2019  4:38 PM) Needs refill  triamcinolone cream (KENALOG) 0.5 % [id number]  Apply 1 application topically 3 (three) times daily. [name], MD  Active   warfarin (COUMADIN) 3 MG tablet [id number] Yes Take 1 tablet daily or As directed by anticoagulation clinic [name], MD Taking Active           Assessment: Drugs sorted by system:  Neurologic/Psychologic: alprazolam  Hematologic: warfarin  Cardiovascular: atorvastatin, furosemide, metoprolol succinate, NTG SL, spironolactone, telmisartan  Endocrine: empagliflozin, glipizide, metformin, insulin glargine (Toujeo), liraglutide (Victoza)  Topical: triamcinolone  Pain: cyclobenzaprine, gabapentin, percocet  Vitamins/Minerals/Supplements: Vitamin C, Vitamin D, MVI, magnesium, potassium, thiamine  Medication Review Findings:   Monitor potassium levels closely with aldosterone antagonist, ARB, + potassium supplement  Allergy noted to morphine - tolerating  oxycodone  Allergy noted to sulfa - tolerating glipizide  Reviewed warfarin dosing, monitoring for bleeding  B12 level low in October, patient has been receiving supplement IM injections, will have recheck in 2 weeks  Medication Assistance Findings:  Medication assistance needs identified: Cynda Familia, Jardiance  Extra Help:  Not eligible for Extra Help Low Income Subsidy based on reported income and assets  Patient Assistance Programs: Toujeo made by [employer] o Income requirement met: Yes o Out-of-pocket prescription expenditure met:   No (2% household income) - Patient has not met application requirements to apply for this program at this time.  - Reviewed program requirements with patient.   - According to patient's insurance, the [place] for [facility] Advantage, Nelva Nay is a Tier 3 and is listed on the [employer] for $35 / month even through the coverage gap.  Reviewed this information with patient.  She is willing to fill this at pharmacy.  Even if coverage gap starts later this year, all of other medications excluding Percocet are Tier 1 and should be affordable.  Patient reports she was able to afford medications at the end of the last year when she went into coverage gap.   - PCP office assisting with PAP application  Jardiance made by BI o Income requirement met: Yes o Out-of-pocket prescription expenditure met:   Not Applicable - Patient has met application requirements to apply for this program.  - Reviewed program requirements with patient.   - Patient already approved through 2021.  I provided contact information to company for patient to call in refills for program.   Victoza made by [place] requirement met: Yes o Out-of-pocket prescription expenditure met:   Not Applicable - Patient has met application requirements to apply for this program.  - Alternative option is to apply for a similar product in the same therapeutic class which  does not have a required out of pocket expenditure.  A possible substitution is  Ozempic or Trulicity  made by [employer] or [place] program requirements with patient.   - Patient will discuss daily vs weekly GLP-1 with PCP - PCP office assisting with PAP application   Additional medication assistance options reviewed with patient as warranted:  Insurance OTC catalogue  Plan: . Will close [facility] pharmacy case as no further medication needs identified at this time.  Am happy to assist in the future as needed.     Ralene Bathe, PharmD, [place] [phone]

## 2019-03-27 ENCOUNTER — Encounter: Payer: Self-pay | Admitting: Internal Medicine

## 2019-04-01 ENCOUNTER — Other Ambulatory Visit: Payer: Self-pay | Admitting: Internal Medicine

## 2019-04-01 ENCOUNTER — Encounter: Payer: Self-pay | Admitting: Internal Medicine

## 2019-04-01 DIAGNOSIS — E1151 Type 2 diabetes mellitus with diabetic peripheral angiopathy without gangrene: Secondary | ICD-10-CM

## 2019-04-02 ENCOUNTER — Ambulatory Visit: Payer: Medicare Other | Attending: Internal Medicine

## 2019-04-02 DIAGNOSIS — Z20822 Contact with and (suspected) exposure to covid-19: Secondary | ICD-10-CM

## 2019-04-03 ENCOUNTER — Encounter: Payer: Self-pay | Admitting: Internal Medicine

## 2019-04-03 LAB — NOVEL CORONAVIRUS, NAA: SARS-CoV-2, NAA: NOT DETECTED

## 2019-04-05 ENCOUNTER — Other Ambulatory Visit: Payer: Self-pay

## 2019-04-05 ENCOUNTER — Ambulatory Visit (INDEPENDENT_AMBULATORY_CARE_PROVIDER_SITE_OTHER): Payer: Medicare Other | Admitting: *Deleted

## 2019-04-05 DIAGNOSIS — E538 Deficiency of other specified B group vitamins: Secondary | ICD-10-CM | POA: Diagnosis not present

## 2019-04-05 MED ORDER — CYANOCOBALAMIN 1000 MCG/ML IJ SOLN
1000.0000 ug | Freq: Once | INTRAMUSCULAR | Status: AC
  Administered 2019-04-05: 1000 ug via INTRAMUSCULAR

## 2019-04-05 NOTE — Progress Notes (Addendum)
I have reviewed and agree.

## 2019-04-05 NOTE — Telephone Encounter (Signed)
   CoverMy Meds is calling to discuss issue with plan covering liraglutide (VICTOZA) 18 MG/3ML SOPN. Ref key: [id number] Phone [phone]   Requesting [facility] be called at [phone]

## 2019-04-09 ENCOUNTER — Ambulatory Visit: Payer: Medicare Other | Admitting: Internal Medicine

## 2019-04-11 ENCOUNTER — Encounter: Payer: Self-pay | Admitting: Internal Medicine

## 2019-04-11 ENCOUNTER — Other Ambulatory Visit: Payer: Self-pay

## 2019-04-11 ENCOUNTER — Ambulatory Visit (INDEPENDENT_AMBULATORY_CARE_PROVIDER_SITE_OTHER): Payer: Medicare Other | Admitting: Internal Medicine

## 2019-04-11 VITALS — BP 120/70 | HR 91 | Temp 97.9°F | Resp 16 | Ht 60.5 in | Wt 219.0 lb

## 2019-04-11 DIAGNOSIS — D510 Vitamin B12 deficiency anemia due to intrinsic factor deficiency: Secondary | ICD-10-CM

## 2019-04-11 DIAGNOSIS — E039 Hypothyroidism, unspecified: Secondary | ICD-10-CM | POA: Diagnosis not present

## 2019-04-11 DIAGNOSIS — E785 Hyperlipidemia, unspecified: Secondary | ICD-10-CM | POA: Diagnosis not present

## 2019-04-11 DIAGNOSIS — I10 Essential (primary) hypertension: Secondary | ICD-10-CM | POA: Diagnosis not present

## 2019-04-11 DIAGNOSIS — E1169 Type 2 diabetes mellitus with other specified complication: Secondary | ICD-10-CM | POA: Diagnosis not present

## 2019-04-11 DIAGNOSIS — E1159 Type 2 diabetes mellitus with other circulatory complications: Secondary | ICD-10-CM

## 2019-04-11 DIAGNOSIS — E519 Thiamine deficiency, unspecified: Secondary | ICD-10-CM | POA: Diagnosis not present

## 2019-04-11 DIAGNOSIS — E1151 Type 2 diabetes mellitus with diabetic peripheral angiopathy without gangrene: Secondary | ICD-10-CM

## 2019-04-11 DIAGNOSIS — I5042 Chronic combined systolic (congestive) and diastolic (congestive) heart failure: Secondary | ICD-10-CM

## 2019-04-11 DIAGNOSIS — Z1231 Encounter for screening mammogram for malignant neoplasm of breast: Secondary | ICD-10-CM

## 2019-04-11 DIAGNOSIS — Z0000 Encounter for general adult medical examination without abnormal findings: Secondary | ICD-10-CM | POA: Diagnosis not present

## 2019-04-11 LAB — BASIC METABOLIC PANEL
BUN: 20 mg/dL (ref 6–23)
CO2: 24 mEq/L (ref 19–32)
Calcium: 9.9 mg/dL (ref 8.4–10.5)
Chloride: 102 mEq/L (ref 96–112)
Creatinine, Ser: 0.86 mg/dL (ref 0.40–1.20)
GFR: 78.64 mL/min (ref >60.00)
Glucose, Bld: 145 mg/dL — ABNORMAL HIGH (ref 70–99)
Potassium: 4.1 mEq/L (ref 3.5–5.1)
Sodium: 138 mEq/L (ref 135–145)

## 2019-04-11 LAB — CBC WITH DIFFERENTIAL/PLATELET
Basophils Absolute: 0.1 10*3/uL (ref 0.0–0.1)
Basophils Relative: 1.2 % (ref 0.0–3.0)
Eosinophils Absolute: 0.1 10*3/uL (ref 0.0–0.7)
Eosinophils Relative: 1.7 % (ref 0.0–5.0)
HCT: 41.3 % (ref 36.0–46.0)
Hemoglobin: 13 g/dL (ref 12.0–15.0)
Lymphocytes Relative: 38.9 % (ref 12.0–46.0)
Lymphs Abs: 2.5 10*3/uL (ref 0.7–4.0)
MCHC: 31.4 g/dL (ref 30.0–36.0)
MCV: 83.2 fl (ref 78.0–100.0)
Monocytes Absolute: 0.6 10*3/uL (ref 0.1–1.0)
Monocytes Relative: 8.5 % (ref 3.0–12.0)
Neutro Abs: 3.2 10*3/uL (ref 1.4–7.7)
Neutrophils Relative %: 49.7 % (ref 43.0–77.0)
Platelets: 398 10*3/uL (ref 150.0–400.0)
RBC: 4.97 Mil/uL (ref 3.87–5.11)
RDW: 16.9 % — ABNORMAL HIGH (ref 11.5–15.5)
WBC: 6.5 10*3/uL (ref 4.0–10.5)

## 2019-04-11 LAB — LIPID PANEL
Cholesterol: 126 mg/dL (ref 0–200)
HDL: 36.7 mg/dL — ABNORMAL LOW (ref >39.00)
LDL Cholesterol: 65 mg/dL (ref 0–99)
NonHDL: 89.49
Total CHOL/HDL Ratio: 3
Triglycerides: 123 mg/dL (ref 0.0–149.0)
VLDL: 24.6 mg/dL (ref 0.0–40.0)

## 2019-04-11 LAB — POCT GLYCOSYLATED HEMOGLOBIN (HGB A1C): Hemoglobin A1C: 9 % — AB (ref 4.0–5.6)

## 2019-04-11 LAB — URINALYSIS, ROUTINE W REFLEX MICROSCOPIC
Bilirubin Urine: NEGATIVE
Hgb urine dipstick: NEGATIVE
Ketones, ur: NEGATIVE
Leukocytes,Ua: NEGATIVE
Nitrite: NEGATIVE
RBC / HPF: NONE SEEN (ref 0–?)
Specific Gravity, Urine: 1.02 (ref 1.000–1.030)
Total Protein, Urine: NEGATIVE
Urine Glucose: >=1000 — AB
Urobilinogen, UA: 0.2 (ref 0.0–1.0)
pH: 5.5 (ref 5.0–8.0)

## 2019-04-11 LAB — HEPATIC FUNCTION PANEL
ALT: 13 U/L (ref 0–35)
AST: 12 U/L (ref 0–37)
Albumin: 4.4 g/dL (ref 3.5–5.2)
Alkaline Phosphatase: 53 U/L (ref 39–117)
Bilirubin, Direct: 0.1 mg/dL (ref 0.0–0.3)
Total Bilirubin: 0.4 mg/dL (ref 0.2–1.2)
Total Protein: 8 g/dL (ref 6.0–8.3)

## 2019-04-11 LAB — VITAMIN B12: Vitamin B-12: 418 pg/mL (ref 211–911)

## 2019-04-11 LAB — FOLATE: Folate: 23.8 ng/mL (ref >5.9)

## 2019-04-11 LAB — MICROALBUMIN / CREATININE URINE RATIO
Creatinine,U: 78.8 mg/dL
Microalb Creat Ratio: 0.9 mg/g (ref 0.0–30.0)
Microalb, Ur: <0.7 mg/dL (ref 0.0–1.9)

## 2019-04-11 LAB — TSH: TSH: 1.75 u[IU]/mL (ref 0.35–4.50)

## 2019-04-11 MED ORDER — VICTOZA 18 MG/3ML ~~LOC~~ SOPN: 1.8000 mg | PEN_INJECTOR | Freq: Every morning | SUBCUTANEOUS | 1 refills | Status: DC

## 2019-04-11 NOTE — Progress Notes (Signed)
Subjective:  Patient ID: Kristen [name], female    DOB: October 17, 1945  Age: 74 y.o. MRN: [id number]  CC: Annual Exam, Anemia, Hypertension, Hypothyroidism, Diabetes, and Hyperlipidemia  This visit occurred during the SARS-CoV-2 public health emergency.  Safety protocols were in place, including screening questions prior to the visit, additional usage of staff PPE, and extensive cleaning of exam room while observing appropriate contact time as indicated for disinfecting solutions.   HPI Kristen [name] presents for a CPX.  She complains of weight gain and poor compliance with her meds.  She cannot tell me what meds she is or is not taking.  She does not think her blood sugars have been very well controlled.  She denies polys, abdominal pain, nausea, vomiting, dizziness, or lightheadedness.  She complains of chronic back and knee pain and is being managed by physical medicine.  She is not very active but when she walks around her house she is not experience any chest pain, shortness of breath, edema, palpitations, or near syncope.  Outpatient Medications Prior to Visit  Medication Sig Dispense Refill  . ALPRAZolam (XANAX) 1 MG tablet Take 1 tablet (1 mg total) by mouth 3 (three) times daily as needed for anxiety. 90 tablet 5  . Ascorbic Acid (VITAMIN C) 1000 MG tablet Take 1,000 mg by mouth daily.    Marland Kitchen atorvastatin (LIPITOR) 40 MG tablet Take 1 tablet (40 mg total) by mouth daily. 90 tablet 1  . Blood Glucose Monitoring Suppl (ONE TOUCH ULTRA 2) w/Device KIT Use as advised 1 each 0  . Cholecalciferol (VITAMIN D3) 50 MCG (2000 UT) capsule Take 2,000 Units by mouth daily.    . cyclobenzaprine (FLEXERIL) 10 MG tablet Take 10 mg by mouth 3 (three) times daily as needed.     . empagliflozin (JARDIANCE) 25 MG TABS tablet Take 25 mg by mouth daily. 90 tablet 3  . fluticasone (FLONASE) 50 MCG/ACT nasal spray Place 1 spray into both nostrils daily as needed for allergies or rhinitis.    .  furosemide (LASIX) 20 MG tablet Take 1 tablet (20 mg total) by mouth daily. 90 tablet 0  . gabapentin (NEURONTIN) 300 MG capsule TAKE ONE CAPSULE BY MOUTH THREE TIMES DAILY 90 capsule 5  . Insulin Glargine, 2 Unit Dial, (TOUJEO MAX SOLOSTAR) 300 UNIT/ML SOPN Inject 50 Units into the skin daily. 45 mL 3  . Insulin Pen Needle (NOVOFINE) 32G X 6 MM MISC 1 Act by Does not apply route daily. 100 each 3  . MAGNESIUM-OXIDE 400 (241.3 Mg) MG tablet TAKE 1 TABLET BY MOUTH TWICE DAILY 60 tablet 9  . metFORMIN (GLUCOPHAGE-XR) 500 MG 24 hr tablet Take 2 tablets (1,000 mg total) by mouth 2 (two) times daily with a meal. 360 tablet 3  . metoprolol succinate (TOPROL-XL) 100 MG 24 hr tablet Take 1 tablet (100 mg total) by mouth 2 (two) times daily. Take with or immediately following a meal. 180 tablet 3  . Multiple Vitamin (MULTIVITAMIN) tablet Take 1 tablet by mouth daily.    . nitroGLYCERIN (NITROSTAT) 0.4 MG SL tablet Place 1 tablet (0.4 mg total) under the tongue every 5 (five) minutes as needed for chest pain (x 3 doses). Reported on 05/08/2015 35 tablet 2  . ondansetron (ZOFRAN-ODT) 4 MG disintegrating tablet Take 1 tablet (4 mg total) by mouth every 8 (eight) hours as needed for nausea or vomiting. 60 tablet 2  . ONE TOUCH ULTRA TEST test strip USE TO TEST BLOOD SUGAR FOUR TIMES  DAILY AS DIRECTED 300 each 2  . PERCOCET 10-325 MG tablet Take 1 tablet by mouth 5 (five) times daily as needed.    . potassium chloride SA (K-DUR) 20 MEQ tablet Take 1 tablet (20 mEq total) by mouth 2 (two) times daily. 180 tablet 3  . spironolactone (ALDACTONE) 25 MG tablet TAKE 1 TABLET(25 MG) BY MOUTH TWICE DAILY 180 tablet 0  . telmisartan (MICARDIS) 40 MG tablet Take 1 tablet (40 mg total) by mouth daily. 90 tablet 1  . thiamine (VITAMIN B-1) 100 MG tablet Take 1 tablet (100 mg total) by mouth daily. 45 tablet 1  . triamcinolone cream (KENALOG) 0.5 % Apply 1 application topically 3 (three) times daily. (Patient taking differently:  Apply 1 application topically 3 (three) times daily as needed. ) 60 g 3  . warfarin (COUMADIN) 3 MG tablet Take 1 tablet daily or As directed by anticoagulation clinic 60 tablet 2  . glipiZIDE (GLUCOTROL) 5 MG tablet TAKE 2 TABLETS BY MOUTH TWICE DAILY BEFORE MEALS 360 tablet 1  . liraglutide (VICTOZA) 18 MG/3ML SOPN Inject 0.3 mLs (1.8 mg total) into the skin every morning. 27 mL 3   No facility-administered medications prior to visit.    ROS Review of Systems  Constitutional: Positive for unexpected weight change (wt gain). Negative for appetite change, chills, diaphoresis and fatigue.  HENT: Negative.   Eyes: Negative.   Respiratory: Negative for cough, chest tightness, shortness of breath and wheezing.   Cardiovascular: Negative for chest pain, palpitations and leg swelling.  Gastrointestinal: Negative for abdominal pain, constipation, diarrhea, nausea and vomiting.  Endocrine: Negative.  Negative for cold intolerance, heat intolerance, polydipsia, polyphagia and polyuria.  Genitourinary: Negative for difficulty urinating and frequency.  Musculoskeletal: Positive for arthralgias and back pain. Negative for myalgias.  Skin: Negative.  Negative for color change and pallor.  Neurological: Negative.  Negative for dizziness, weakness, light-headedness and headaches.  Hematological: Negative for adenopathy. Does not bruise/bleed easily.  Psychiatric/Behavioral: Negative.     Objective:  BP 120/70 (BP Location: Left Arm, Patient Position: Sitting, Cuff Size: Large)   Pulse 91   Temp 97.9 F (36.6 C) (Oral)   Resp 16   Ht 5' 0.5" (1.537 m)   Wt 219 lb (99.3 kg)   SpO2 95%   BMI 42.07 kg/m   BP Readings from Last 3 Encounters:  04/11/19 120/70  12/19/18 118/70  10/22/18 130/80    Wt Readings from Last 3 Encounters:  04/11/19 219 lb (99.3 kg)  12/19/18 204 lb (92.5 kg)  10/22/18 206 lb 9.6 oz (93.7 kg)    Physical Exam Vitals reviewed.  Constitutional:      Appearance:  She is obese.  HENT:     Nose: Nose normal.     Mouth/Throat:     Mouth: Mucous membranes are moist.  Eyes:     General: No scleral icterus.    Conjunctiva/sclera: Conjunctivae normal.  Cardiovascular:     Rate and Rhythm: Normal rate and regular rhythm.     Heart sounds: No murmur.  Pulmonary:     Effort: Pulmonary effort is normal.     Breath sounds: No stridor. No wheezing, rhonchi or rales.  Abdominal:     General: Abdomen is protuberant. Bowel sounds are normal. There is no distension.     Palpations: Abdomen is soft. There is no hepatomegaly, splenomegaly or mass.     Tenderness: There is no abdominal tenderness.  Musculoskeletal:        General: Normal  range of motion.     Cervical back: Neck supple.     Right lower leg: No edema.     Left lower leg: No edema.  Lymphadenopathy:     Cervical: No cervical adenopathy.  Skin:    General: Skin is warm and dry.     Coloration: Skin is not pale.  Neurological:     General: No focal deficit present.     Mental Status: She is alert and oriented to person, place, and time. Mental status is at baseline.  Psychiatric:        Attention and Perception: Perception normal. She is inattentive.        Mood and Affect: Mood normal. Affect is flat.        Speech: Speech is delayed and tangential. Speech is not slurred.        Behavior: Behavior is slowed. Behavior is not aggressive. Behavior is cooperative.        Thought Content: Thought content normal. Thought content is not paranoid or delusional. Thought content does not include homicidal or suicidal ideation.        Cognition and Memory: Cognition is not impaired. Memory is impaired. She exhibits impaired recent memory and impaired remote memory.        Judgment: Judgment normal.     Lab Results  Component Value Date   WBC 6.5 04/11/2019   HGB 13.0 04/11/2019   HCT 41.3 04/11/2019   PLT 398.0 04/11/2019   GLUCOSE 145 (H) 04/11/2019   CHOL 126 04/11/2019   TRIG 123.0  04/11/2019   HDL 36.70 (L) 04/11/2019   LDLDIRECT 116.0 11/28/2008   LDLCALC 65 04/11/2019   ALT 13 04/11/2019   AST 12 04/11/2019   NA 138 04/11/2019   K 4.1 04/11/2019   CL 102 04/11/2019   CREATININE 0.86 04/11/2019   BUN 20 04/11/2019   CO2 24 04/11/2019   TSH 1.75 04/11/2019   INR 2.9 03/22/2019   HGBA1C 9.0 (A) 04/11/2019   MICROALBUR <0.7 04/11/2019    MR SHOULDER LEFT WO CONTRAST  Result Date: 10/02/2017 CLINICAL DATA:  Progressive left shoulder pain. Remote history of rotator cuff surgery. EXAM: MRI OF THE LEFT SHOULDER WITHOUT CONTRAST TECHNIQUE: Multiplanar, multisequence MR imaging of the shoulder was performed. No intravenous contrast was administered. COMPARISON:  Radiograph 08/16/2017 FINDINGS: Rotator cuff: Moderate rotator cuff tendinopathy/tendinosis but no recurrent tear is identified. There are interstitial tears but no partial thickness tear. Muscles: Mild diffuse fatty change involving the shoulder musculature. Biceps long head:  Intact Acromioclavicular Joint: Postoperative changes from prior bony decompressive surgery. No findings for bony impingement. Glenohumeral Joint: Moderate to advanced degenerative changes with moderate cartilage loss, joint space narrowing, osteophytic spurring, subchondral cystic change, small joint effusion and mild to moderate synovitis. Labrum:  Labral degenerative changes.  No gross tear. Bones: No acute bony findings. Subchondral cystic change and possible intraosseous ganglion in the humeral head. Other: Moderate subacromial/subdeltoid bursitis. Benign-appearing lipoma is noted near the axillary recess. IMPRESSION: 1. Surgical changes from prior rotator cuff repair and bony decompressive surgery. No findings for recurrent rotator cuff tear. Moderate supraspinatus tendinopathy/tendinosis. 2. Moderate to advanced glenohumeral joint degenerative changes as detailed above. There is a small joint effusion and mild to moderate synovitis. 3. Intact  long head biceps tendon. 4. Advanced labral degenerative changes without obvious tear. 5. Moderate subacromial/subdeltoid bursitis. Electronically Signed   By: Marijo Sanes M.D.   On: 10/02/2017 08:32    Assessment & Plan:   Kristen [name] was  seen today for annual exam, anemia, hypertension, hypothyroidism, diabetes and hyperlipidemia.  Diagnoses and all orders for this visit:  DM (diabetes mellitus), type 2 with peripheral vascular complications ([place])- Her blood sugars are not well controlled with an A1c of 9%.  She is gaining weight.  I recommended that she stop taking the sulfonylurea and instead to be more compliant with the GLP-1 agonist.  She also agrees to be more compliant with the basal insulin and SGLT2 inhibitor. -     POCT glycosylated hemoglobin (Hb A1C) -     Basic metabolic panel -     Microalbumin / creatinine urine ratio -     Urinalysis, Routine w reflex microscopic -     liraglutide (VICTOZA) 18 MG/3ML SOPN; Inject 0.3 mLs (1.8 mg total) into the skin every morning. -     Consult to [place] [facility]  Hypertension associated with diabetes ([place])- Her blood pressure is adequately well controlled.  Electrolytes and renal function are normal. -     Basic metabolic panel -     Urinalysis, Routine w reflex microscopic  Acquired hypothyroidism- Her TSH is in the normal range.  She will remain on the current dose of levothyroxine. -     TSH  Hyperlipidemia associated with type 2 diabetes mellitus ([place])- She has achieved her LDL goal is doing well on the statin. -     Lipid panel -     Hepatic function panel  Vitamin B12 deficiency anemia due to intrinsic factor deficiency- Her H&H, B12, and folate levels are normal. -     CBC with Differential/Platelet -     Vitamin B12 -     Folate  Thiamine deficiency- Her H&H are normal.  I will monitor her thiamine level. -     CBC with Differential/Platelet -     Vitamin B1  Visit for screening mammogram -     MM DIGITAL SCREENING  BILATERAL; Future  CHF (congestive heart failure), NYHA class I, chronic, combined ([place])- She is asymptomatic and has a normal volume status.  I have encouraged her to have her annual visit with cardiology as soon as possible. -     Ambulatory referral to Cardiology   I have discontinued Kristen [name]'s glipiZIDE. I am also having her maintain her ONE TOUCH ULTRA 2, ONE TOUCH ULTRA TEST, cyclobenzaprine, nitroGLYCERIN, gabapentin, Insulin Pen Needle, metoprolol succinate, potassium chloride SA, MAGnesium-Oxide, triamcinolone cream, thiamine, Jardiance, ondansetron, Toujeo Max SoloStar, atorvastatin, warfarin, furosemide, telmisartan, metFORMIN, ALPRAZolam, Percocet, spironolactone, multivitamin, vitamin C, Vitamin D3, fluticasone, and Victoza.  Meds ordered this encounter  Medications  . liraglutide (VICTOZA) 18 MG/3ML SOPN    Sig: Inject 0.3 mLs (1.8 mg total) into the skin every morning.    Dispense:  27 mL    Refill:  1     Follow-up: Return in about 3 months (around 07/09/2019).  Kristen Calico, MD

## 2019-04-11 NOTE — Telephone Encounter (Signed)
Kristen [name] with CoverMyMeds calling in regards to victoza PA. States that it is not in their protocol to "leave a message".  CB#: [phone]

## 2019-04-11 NOTE — Telephone Encounter (Signed)
There is already a message about cover my meds and the Victoza PA. I am closing this note.

## 2019-04-11 NOTE — Patient Instructions (Signed)
Health Maintenance, Female Adopting a healthy lifestyle and getting preventive care are important in promoting health and wellness. Ask your health care provider about:  The right schedule for you to have regular tests and exams.  Things you can do on your own to prevent diseases and keep yourself healthy. What should I know about diet, weight, and exercise? Eat a healthy diet   Eat a diet that includes plenty of vegetables, fruits, low-fat dairy products, and lean protein.  Do not eat a lot of foods that are high in solid fats, added sugars, or sodium. Maintain a healthy weight Body mass index (BMI) is used to identify weight problems. It estimates body fat based on height and weight. Your health care provider can help determine your BMI and help you achieve or maintain a healthy weight. Get regular exercise Get regular exercise. This is one of the most important things you can do for your health. Most adults should:  Exercise for at least 150 minutes each week. The exercise should increase your heart rate and make you sweat (moderate-intensity exercise).  Do strengthening exercises at least twice a week. This is in addition to the moderate-intensity exercise.  Spend less time sitting. Even light physical activity can be beneficial. Watch cholesterol and blood lipids Have your blood tested for lipids and cholesterol at 72 years of age, then have this test every 5 years. Have your cholesterol levels checked more often if:  Your lipid or cholesterol levels are high.  You are older than 72 years of age.  You are at high risk for heart disease. What should I know about cancer screening? Depending on your health history and family history, you may need to have cancer screening at various ages. This may include screening for:  Breast cancer.  Cervical cancer.  Colorectal cancer.  Skin cancer.  Lung cancer. What should I know about heart disease, diabetes, and high blood  pressure? Blood pressure and heart disease  High blood pressure causes heart disease and increases the risk of stroke. This is more likely to develop in people who have high blood pressure readings, are of African descent, or are overweight.  Have your blood pressure checked: ? Every 3-5 years if you are 18-39 years of age. ? Every year if you are 40 years old or older. Diabetes Have regular diabetes screenings. This checks your fasting blood sugar level. Have the screening done:  Once every three years after age 40 if you are at a normal weight and have a low risk for diabetes.  More often and at a younger age if you are overweight or have a high risk for diabetes. What should I know about preventing infection? Hepatitis B If you have a higher risk for hepatitis B, you should be screened for this virus. Talk with your health care provider to find out if you are at risk for hepatitis B infection. Hepatitis C Testing is recommended for:  Everyone born from 1945 through 1965.  Anyone with known risk factors for hepatitis C. Sexually transmitted infections (STIs)  Get screened for STIs, including gonorrhea and chlamydia, if: ? You are sexually active and are younger than 72 years of age. ? You are older than 72 years of age and your health care provider tells you that you are at risk for this type of infection. ? Your sexual activity has changed since you were last screened, and you are at increased risk for chlamydia or gonorrhea. Ask your health care provider if   you are at risk.  Ask your health care provider about whether you are at high risk for HIV. Your health care provider may recommend a prescription medicine to help prevent HIV infection. If you choose to take medicine to prevent HIV, you should first get tested for HIV. You should then be tested every 3 months for as long as you are taking the medicine. Pregnancy  If you are about to stop having your period (premenopausal) and  you may become pregnant, seek counseling before you get pregnant.  Take 400 to 800 micrograms (mcg) of folic acid every day if you become pregnant.  Ask for birth control (contraception) if you want to prevent pregnancy. Osteoporosis and menopause Osteoporosis is a disease in which the bones lose minerals and strength with aging. This can result in bone fractures. If you are 65 years old or older, or if you are at risk for osteoporosis and fractures, ask your health care provider if you should:  Be screened for bone loss.  Take a calcium or vitamin D supplement to lower your risk of fractures.  Be given hormone replacement therapy (HRT) to treat symptoms of menopause. Follow these instructions at home: Lifestyle  Do not use any products that contain nicotine or tobacco, such as cigarettes, e-cigarettes, and chewing tobacco. If you need help quitting, ask your health care provider.  Do not use street drugs.  Do not share needles.  Ask your health care provider for help if you need support or information about quitting drugs. Alcohol use  Do not drink alcohol if: ? Your health care provider tells you not to drink. ? You are pregnant, may be pregnant, or are planning to become pregnant.  If you drink alcohol: ? Limit how much you use to 0-1 drink a day. ? Limit intake if you are breastfeeding.  Be aware of how much alcohol is in your drink. In the [place] one drink equals one 12 oz bottle of beer (355 mL), one 5 oz glass of wine (148 mL), or one 1 oz glass of hard liquor (44 mL). General instructions  Schedule regular health, dental, and eye exams.  Stay current with your vaccines.  Tell your health care provider if: ? You often feel depressed. ? You have ever been abused or do not feel safe at home. Summary  Adopting a healthy lifestyle and getting preventive care are important in promoting health and wellness.  Follow your health care provider's instructions about healthy  diet, exercising, and getting tested or screened for diseases.  Follow your health care provider's instructions on monitoring your cholesterol and blood pressure. This information is not intended to replace advice given to you by your health care provider. Make sure you discuss any questions you have with your health care provider. Document Revised: 02/14/2018 Document Reviewed: 02/14/2018 Elsevier Patient Education  2020 Elsevier Inc.  

## 2019-04-12 ENCOUNTER — Encounter: Payer: Self-pay | Admitting: Internal Medicine

## 2019-04-13 NOTE — Assessment & Plan Note (Signed)
Exam completed Labs reviewed Vaccines reviewed and updated Colon cancer screening is up-to-date She defers on cervical cancer screening She is referred for breast cancer screening with a mammogram Patient education was given.

## 2019-04-14 LAB — VITAMIN B1: Vitamin B1 (Thiamine): 7 nmol/L — ABNORMAL LOW (ref 8–30)

## 2019-04-15 ENCOUNTER — Encounter: Payer: Self-pay | Admitting: Internal Medicine

## 2019-04-15 ENCOUNTER — Other Ambulatory Visit: Payer: Self-pay | Admitting: Internal Medicine

## 2019-04-15 DIAGNOSIS — E519 Thiamine deficiency, unspecified: Secondary | ICD-10-CM

## 2019-04-15 DIAGNOSIS — G894 Chronic pain syndrome: Secondary | ICD-10-CM | POA: Diagnosis not present

## 2019-04-15 DIAGNOSIS — M150 Primary generalized (osteo)arthritis: Secondary | ICD-10-CM | POA: Diagnosis not present

## 2019-04-15 DIAGNOSIS — M25512 Pain in left shoulder: Secondary | ICD-10-CM | POA: Diagnosis not present

## 2019-04-15 DIAGNOSIS — Z79891 Long term (current) use of opiate analgesic: Secondary | ICD-10-CM | POA: Diagnosis not present

## 2019-04-15 MED ORDER — THIAMINE HCL 100 MG PO TABS: 100.0000 mg | ORAL_TABLET | Freq: Every day | ORAL | 1 refills | Status: DC

## 2019-04-18 ENCOUNTER — Ambulatory Visit: Payer: Medicare Other | Admitting: Cardiovascular Disease

## 2019-04-18 ENCOUNTER — Other Ambulatory Visit: Payer: Self-pay

## 2019-04-18 ENCOUNTER — Encounter: Payer: Self-pay | Admitting: Cardiovascular Disease

## 2019-04-18 VITALS — BP 122/78 | HR 93 | Ht 60.5 in | Wt 216.4 lb

## 2019-04-18 DIAGNOSIS — I480 Paroxysmal atrial fibrillation: Secondary | ICD-10-CM | POA: Diagnosis not present

## 2019-04-18 DIAGNOSIS — E782 Mixed hyperlipidemia: Secondary | ICD-10-CM | POA: Diagnosis not present

## 2019-04-18 DIAGNOSIS — I2510 Atherosclerotic heart disease of native coronary artery without angina pectoris: Secondary | ICD-10-CM | POA: Diagnosis not present

## 2019-04-18 DIAGNOSIS — I10 Essential (primary) hypertension: Secondary | ICD-10-CM

## 2019-04-18 NOTE — Patient Instructions (Signed)
Medication Instructions:  Your provider recommends that you continue on your current medications as directed. Please refer to the Current Medication list given to you today.   *If you need a refill on your cardiac medications before your next appointment, please call your pharmacy*   Follow-Up: At [facility], you and your health needs are our priority.  As part of our continuing mission to provide you with exceptional heart care, we have created designated Provider Care Teams.  These Care Teams include your primary Cardiologist (physician) and Advanced Practice Providers (APPs -  Physician Assistants and Nurse Practitioners) who all work together to provide you with the care you need, when you need it. Your next appointment:   6 month(s) The format for your next appointment:   In Person Provider:   You may see Michael Cooper, MD or one of the following Advanced Practice Providers on your designated Care Team:    Scott Weaver, PA-C  Vin Bhagat, PA-C  Janine Hammond, NP   

## 2019-04-18 NOTE — Progress Notes (Signed)
Cardiology Office Note:    Date:  04/19/2019   ID:  Kristen [name], Kristen [name] 06-27-1945, MRN [id number]  PCP:  [name], MD  Cardiologist:  Sherren Mocha, MD  Electrophysiologist:  None   Referring MD: [name], MD   Chief Complaint  Patient presents with  . Coronary Artery Disease    History of Present Illness:    Kristen [name] is a 74 y.o. female with a hx of coronary artery disease status post CABG in 2004. Other medical problems include paroxysmal atrial fibrillation on warfarin, history of stroke, obesity, diabetes, hypertension, and left bundle branch block.  The patient reports a single episode of chest discomfort when she walked on a very cold day with temperature 20 degrees. She's not sure if this was angina or her lungs, but symptoms resolved quickly after she got in warmer temperature.  Mild exertional dyspnea is unchanged over time.  She denies orthopnea, PND, or leg swelling.  No lightheadedness or syncope.  No recent issues with heart palpitations.  She has primarily been inside during the COVID-19 pandemic and has been less active than normal.  Past Medical History:  Diagnosis Date  . Arthritis   . Blood transfusion without reported diagnosis   . Bursitis   . CAD (coronary artery disease)    a. s/p CABG 2004.b. stable cath 2014 demonstrating stable CAD and continued patency of her LIMA graft.  . Chronic anticoagulation    on coumadin  . DDD (degenerative disc disease), lumbar   . Depression   . Diabetes mellitus   . Diastolic dysfunction    per echo in October 2012 with EF 50 to 55%  . Fibromyalgia   . GERD (gastroesophageal reflux disease) 10/23/2003  . Headache(784.0)   . Hyperlipidemia   . Hypertension   . Hypokalemia   . LBBB (left bundle branch block)   . Lumbar back pain   . LV dysfunction    a. EF 45% by cath 2014. b. EF 50-55% by technically difficult echo in 08/2014.  Marland Kitchen Lymphadenitis   . Morbid obesity ([place])    a. Sleep  study negative for significant OSA in 11/2014.  Marland Kitchen PAF (paroxysmal atrial fibrillation) ([place])   . Stroke [facility]) 2004   affected speech per pt    Past Surgical History:  Procedure Laterality Date  . ABDOMINAL HYSTERECTOMY    . ANGIOPLASTY  laminectomy  . CHOLECYSTECTOMY    . CORONARY ARTERY BYPASS GRAFT     LIMA to LAD   . KNEE ARTHROSCOPY    . LEFT HEART CATHETERIZATION WITH CORONARY/GRAFT ANGIOGRAM  12/07/2012   Procedure: LEFT HEART CATHETERIZATION WITH Beatrix Fetters;  Surgeon: Blane Ohara, MD;  Location: [facility] CATH LAB;  Service: Cardiovascular;;  . LUMBAR LAMINECTOMY     x3  . SPHINCTEROTOMY    . TONSILLECTOMY      Current Medications: Current Meds  Medication Sig  . ALPRAZolam (XANAX) 1 MG tablet Take 1 tablet (1 mg total) by mouth 3 (three) times daily as needed for anxiety.  . Ascorbic Acid (VITAMIN C) 1000 MG tablet Take 1,000 mg by mouth daily.  Marland Kitchen atorvastatin (LIPITOR) 40 MG tablet Take 1 tablet (40 mg total) by mouth daily.  . Blood Glucose Monitoring Suppl (ONE TOUCH ULTRA 2) w/Device KIT Use as advised  . Cholecalciferol (VITAMIN D3) 50 MCG (2000 UT) capsule Take 2,000 Units by mouth daily.  . cyclobenzaprine (FLEXERIL) 10 MG tablet Take 10 mg by mouth 3 (three) times daily  as needed.   . empagliflozin (JARDIANCE) 25 MG TABS tablet Take 25 mg by mouth daily.  . fluticasone (FLONASE) 50 MCG/ACT nasal spray Place 1 spray into both nostrils daily as needed for allergies or rhinitis.  . furosemide (LASIX) 20 MG tablet Take 1 tablet (20 mg total) by mouth daily.  Marland Kitchen gabapentin (NEURONTIN) 300 MG capsule TAKE ONE CAPSULE BY MOUTH THREE TIMES DAILY  . glipiZIDE (GLUCOTROL) 5 MG tablet Take 10 mg by mouth 2 (two) times daily before a meal.  . Insulin Glargine, 2 Unit Dial, (TOUJEO MAX SOLOSTAR) 300 UNIT/ML SOPN Inject 50 Units into the skin daily.  . Insulin Pen Needle (NOVOFINE) 32G X 6 MM MISC 1 Act by Does not apply route daily.  Marland Kitchen liraglutide (VICTOZA) 18 MG/3ML  SOPN Inject 0.3 mLs (1.8 mg total) into the skin every morning.  Marland Kitchen MAGNESIUM-OXIDE 400 (241.3 Mg) MG tablet TAKE 1 TABLET BY MOUTH TWICE DAILY  . metFORMIN (GLUCOPHAGE-XR) 500 MG 24 hr tablet Take 2 tablets (1,000 mg total) by mouth 2 (two) times daily with a meal.  . metoprolol succinate (TOPROL-XL) 100 MG 24 hr tablet Take 1 tablet (100 mg total) by mouth 2 (two) times daily. Take with or immediately following a meal.  . Multiple Vitamin (MULTIVITAMIN) tablet Take 1 tablet by mouth daily.  . nitroGLYCERIN (NITROSTAT) 0.4 MG SL tablet Place 1 tablet (0.4 mg total) under the tongue every 5 (five) minutes as needed for chest pain (x 3 doses). Reported on 05/08/2015  . ondansetron (ZOFRAN-ODT) 4 MG disintegrating tablet Take 1 tablet (4 mg total) by mouth every 8 (eight) hours as needed for nausea or vomiting.  . ONE TOUCH ULTRA TEST test strip USE TO TEST BLOOD SUGAR FOUR TIMES DAILY AS DIRECTED  . PERCOCET 10-325 MG tablet Take 1 tablet by mouth 5 (five) times daily as needed.  . potassium chloride SA (K-DUR) 20 MEQ tablet Take 1 tablet (20 mEq total) by mouth 2 (two) times daily.  Marland Kitchen spironolactone (ALDACTONE) 25 MG tablet TAKE 1 TABLET(25 MG) BY MOUTH TWICE DAILY  . telmisartan (MICARDIS) 40 MG tablet Take 1 tablet (40 mg total) by mouth daily.  Marland Kitchen thiamine 100 MG tablet Take 1 tablet (100 mg total) by mouth daily.  Marland Kitchen triamcinolone cream (KENALOG) 0.5 % Apply 1 application topically 3 (three) times daily.  Marland Kitchen warfarin (COUMADIN) 3 MG tablet Take 1 tablet daily or As directed by anticoagulation clinic     Allergies:   Metformin and related, Cleocin [clindamycin hcl], Codeine, Doxycycline hyclate, Macrolides and ketolides, Morphine, Pentazocine lactate, Vibramycin [doxycycline calcium], Definity [perflutren lipid microsphere], and Sulfonamide derivatives   Social History   Socioeconomic History  . Marital status: Married    Spouse name: Not on file  . Number of children: Not on file  . Years of  education: Not on file  . Highest education level: Not on file  Occupational History  . Not on file  Tobacco Use  . Smoking status: Former Smoker    Quit date: 03/07/2001    Years since quitting: 18.1  . Smokeless tobacco: Never Used  Substance and Sexual Activity  . Alcohol use: No  . Drug use: No  . Sexual activity: Not Currently  Other Topics Concern  . Not on file  Social History Narrative   Lives locally, has help available if needed.   Social Determinants of Health   Financial Resource Strain:   . Difficulty of Paying Living Expenses: Not on file  Food Insecurity:   .  Worried About Charity fundraiser in the Last Year: Not on file  . Ran Out of Food in the Last Year: Not on file  Transportation Needs:   . Lack of Transportation (Medical): Not on file  . Lack of Transportation (Non-Medical): Not on file  Physical Activity:   . Days of Exercise per Week: Not on file  . Minutes of Exercise per Session: Not on file  Stress:   . Feeling of Stress : Not on file  Social Connections:   . Frequency of Communication with Friends and Family: Not on file  . Frequency of Social Gatherings with Friends and Family: Not on file  . Attends Religious Services: Not on file  . Active Member of Clubs or Organizations: Not on file  . Attends Archivist Meetings: Not on file  . Marital Status: Not on file     Family History: The patient's family history includes Anxiety disorder in her maternal aunt; Arthritis in an other family member; Colon cancer in her paternal uncle; Depression in her sister; Diabetes in an other family member; Heart attack in her father; Heart disease in her father; Hypertension in an other family member; Kidney disease in an other family member; Stroke in her mother and another family member.  ROS:   Please see the history of present illness.    All other systems reviewed and are negative.  EKGs/Labs/Other Studies Reviewed:    EKG:  EKG is not ordered  today.    Recent Labs: 12/19/2018: Pro B Natriuretic peptide (BNP) 21.0 04/11/2019: ALT 13; BUN 20; Creatinine, Ser 0.86; Hemoglobin 13.0; Platelets 398.0; Potassium 4.1; Sodium 138; TSH 1.75  Recent Lipid Panel    Component Value Date/Time   CHOL 126 04/11/2019 1455   CHOL 108 12/26/2017 0758   TRIG 123.0 04/11/2019 1455   HDL 36.70 (L) 04/11/2019 1455   HDL 38 (L) 12/26/2017 0758   CHOLHDL 3 04/11/2019 1455   VLDL 24.6 04/11/2019 1455   LDLCALC 65 04/11/2019 1455   LDLCALC 48 12/26/2017 0758   LDLDIRECT 116.0 11/28/2008 1122    Physical Exam:    VS:  BP 122/78   Pulse 93   Ht 5' 0.5" (1.537 m)   Wt 216 lb 6.4 oz (98.2 kg)   SpO2 96%   BMI 41.57 kg/m     Wt Readings from Last 3 Encounters:  04/18/19 216 lb 6.4 oz (98.2 kg)  04/11/19 219 lb (99.3 kg)  12/19/18 204 lb (92.5 kg)     GEN:  Well nourished, well developed in no acute distress HEENT: Normal NECK: No JVD; No carotid bruits LYMPHATICS: No lymphadenopathy CARDIAC: RRR, no murmurs, rubs, gallops RESPIRATORY:  Clear to auscultation without rales, wheezing or rhonchi  ABDOMEN: Soft, non-tender, non-distended MUSCULOSKELETAL:  No edema; No deformity  SKIN: Warm and dry NEUROLOGIC:  Alert and oriented x 3 PSYCHIATRIC:  Normal affect   ASSESSMENT:    1. PAF (paroxysmal atrial fibrillation) ([place])   2. Coronary artery disease involving native coronary artery of native heart without angina pectoris   3. Mixed hyperlipidemia   4. Essential hypertension    PLAN:    In order of problems listed above:  1. The patient is maintaining sinus rhythm on her current medical regimen.  She is tolerating warfarin without bleeding complications.  At this time she appears asymptomatic. 2. Angina is well controlled on her current program.  She continues on high-dose beta-blocker, high intensity statin drug, and warfarin.  No antiplatelet therapy  in the context of long-term oral anticoagulation. 3. Treated with a high  intensity statin drug.  Most recent lipids reviewed.  LDL cholesterol is 65 mg/dL. 4. Blood pressure is well controlled on metoprolol succinate, telmisartan, and spironolactone.   Medication Adjustments/Labs and Tests Ordered: Current medicines are reviewed at length with the patient today.  Concerns regarding medicines are outlined above.  No orders of the defined types were placed in this encounter.  No orders of the defined types were placed in this encounter.   Patient Instructions  Medication Instructions:  Your provider recommends that you continue on your current medications as directed. Please refer to the Current Medication list given to you today.   *If you need a refill on your cardiac medications before your next appointment, please call your pharmacy*   Follow-Up: At [facility], you and your health needs are our priority.  As part of our continuing mission to provide you with exceptional heart care, we have created designated Provider Care Teams.  These Care Teams include your primary Cardiologist (physician) and Advanced Practice Providers (APPs -  Physician Assistants and Nurse Practitioners) who all work together to provide you with the care you need, when you need it. Your next appointment:   6 months The format for your next appointment:   In Person Provider:   You may see Sherren Mocha, MD or one of the following Advanced Practice Providers on your designated Care Team:    Richardson Dopp, PA-C  Vin Fremont, PA-C  Daune Perch, [place]     Signed, Sherren Mocha, MD  04/19/2019 8:10 AM    [place]

## 2019-04-19 ENCOUNTER — Ambulatory Visit (INDEPENDENT_AMBULATORY_CARE_PROVIDER_SITE_OTHER): Payer: Medicare Other | Admitting: General Practice

## 2019-04-19 ENCOUNTER — Encounter: Payer: Self-pay | Admitting: Cardiovascular Disease

## 2019-04-19 DIAGNOSIS — I480 Paroxysmal atrial fibrillation: Secondary | ICD-10-CM

## 2019-04-19 DIAGNOSIS — Z7901 Long term (current) use of anticoagulants: Secondary | ICD-10-CM

## 2019-04-19 LAB — POCT INR: INR: 2 (ref 2.0–3.0)

## 2019-04-19 NOTE — Patient Instructions (Addendum)
Pre visit review using our clinic review tool, if applicable. No additional management support is needed unless otherwise documented below in the visit note.  Continue to take 1 tablet daily except 2 tablets on Mon and Fridays. Re-check in 4 weeks.

## 2019-04-19 NOTE — Progress Notes (Signed)
Medical screening examination/treatment/procedure(s) were performed by non-physician practitioner and as supervising physician I was immediately available for consultation/collaboration. I agree with above. Erion Weightman, MD   

## 2019-04-23 ENCOUNTER — Encounter: Payer: Self-pay | Admitting: Internal Medicine

## 2019-04-24 ENCOUNTER — Other Ambulatory Visit: Payer: Self-pay | Admitting: *Deleted

## 2019-04-24 ENCOUNTER — Encounter: Payer: Self-pay | Admitting: *Deleted

## 2019-04-24 NOTE — Patient Outreach (Signed)
[facility]) Care Management  04/24/2019  Kristen [name] [birth year] [id number]   RN Health Coach Monthly Outreach  Referral Date:01/02/2018 Referral Source:MD Referral Screening Reason for Referral:Disease Management Education Insurance:[employer] Medicare   Outreach Attempt:  Successful telephone outreach to patient for follow up. HIPAA verified with patient.  Patient reporting she has obtained her first COVID vaccine with arm soreness as her side effect; denies any fever or flu like symptoms.  Does report her latest Hgb A1C remains 9 on 04/11/2019.  Discussed ways to help reduce A1C.  Fasting blood sugar this morning was 123, with morning fasting ranges of 120-130's.  States she has now come up with medication routine to make sure she takes her insulins daily.  Does report her afternoon/evening blood sugars have ranged 170-180's, which is reduced from 200's since new medication routine.  Has been weighing daily.  Weight this morning was 214 pounds.  Denies any lower extremity swelling.  Does report shortness of breath with activity, that patient relates to being less active at this time.  Appointments:  Attended appointment with primary care provider, Dr. Ronnald Ramp on 04/11/2019.  Attended appointment with Cardiologist, Dr. Burt Knack on 04/18/2019.  Had diabetic eye exam on 03/21/2019 and needs to schedule cataract surgery.  Plan: RN Health Coach will make next telephone outreach to patient within the month of March and patient agrees to future outreach.  [place] [phone] [email or web address]

## 2019-04-30 ENCOUNTER — Encounter: Payer: Self-pay | Admitting: Internal Medicine

## 2019-05-01 ENCOUNTER — Telehealth: Payer: Self-pay | Admitting: Cardiovascular Disease

## 2019-05-01 ENCOUNTER — Telehealth: Payer: Self-pay | Admitting: Internal Medicine

## 2019-05-01 DIAGNOSIS — I2510 Atherosclerotic heart disease of native coronary artery without angina pectoris: Secondary | ICD-10-CM

## 2019-05-01 NOTE — Chronic Care Management (AMB) (Signed)
Chronic Care Management   Note  05/01/2019 Name: Kristen [name] MRN: [id number] DOB: 1946-02-29  Kristen [name] is a 74 y.o. year old female who is a primary care patient of [name], MD. I reached out to [name] by phone today in response to a referral sent by Ms. [name] Dick's PCP, [name], MD.   Ms. Jenai Scaletta was given information about Chronic Care Management services today including:  1. CCM service includes personalized support from designated clinical staff supervised by her physician, including individualized plan of care and coordination with other care providers 2. 24/7 contact phone numbers for assistance for urgent and routine care needs. 3. Service will only be billed when office clinical staff spend 20 minutes or more in a month to coordinate care. 4. Only one practitioner may furnish and bill the service in a calendar month. 5. The patient may stop CCM services at any time (effective at the end of the month) by phone call to the office staff. 6. The patient will be responsible for cost sharing (co-pay) of up to 20% of the service fee (after annual deductible is met).  Patient agreed to services and verbal consent obtained.   Follow up plan:   Raynicia Dukes [employer] Scheduler

## 2019-05-01 NOTE — Telephone Encounter (Signed)
New Message:   Patients states she needs to speak with Colletta [place] is regards to a message she received from another provider. Pt would not give anymore detail but would like a call back when you get a chance. Please advise.

## 2019-05-01 NOTE — Telephone Encounter (Signed)
Fine with the cream. Not sure on the Covid vaccine please check with PCP.

## 2019-05-01 NOTE — Telephone Encounter (Signed)
Addressed and pt stated understanding.

## 2019-05-01 NOTE — Telephone Encounter (Signed)
New message:    Patient calling to speak with some one concerning her taken the covid she is having some reactions to it.

## 2019-05-01 NOTE — Telephone Encounter (Signed)
The patient states she had a delayed reaction to her COVID vaccination. She has a large, itchy, red circle around the administration site. She sent a message to her PCP and was instructed to put vaseline on the site but this gave her no relief. She was then instructed to put Benadryl cream on the site, but she is reluctant to try Benadryl cream because she was once told by the ED staff never to use Benadryl because it caused such bad HTN.  Her PCP instructed her to contact Cardiology to see if the Benadryl cream would be OK to use since she was so sensitive to PO Benadryl. She also has an active prescription for triamcinolone cream. Instructed her to try that and see if it brings her relief since she has not had a bad reaction to that.   She wants to know: 1) if the Benadryl cream would be OK to try if triamcinolone doesn't work 2) if she should get the second dose of COVID vaccine given she had a dermal reaction to the first one.

## 2019-05-01 NOTE — Addendum Note (Signed)
Addended by: Karle Barr on: 05/01/2019 04:34 PM   Modules accepted: Orders

## 2019-05-02 ENCOUNTER — Encounter: Payer: Self-pay | Admitting: Internal Medicine

## 2019-05-02 NOTE — Telephone Encounter (Signed)
The patient states the triamcinolone cream helped a lot so she will not try the Benadryl cream. She understands to consult PCP for further recommendations regarding COVID vaccination. She was grateful for assistance.

## 2019-05-03 ENCOUNTER — Encounter: Payer: Self-pay | Admitting: Family

## 2019-05-03 ENCOUNTER — Other Ambulatory Visit: Payer: Self-pay

## 2019-05-03 ENCOUNTER — Ambulatory Visit (INDEPENDENT_AMBULATORY_CARE_PROVIDER_SITE_OTHER): Payer: Medicare Other | Admitting: Family

## 2019-05-03 VITALS — BP 126/76 | HR 92 | Temp 99.3°F | Ht 60.5 in

## 2019-05-03 DIAGNOSIS — R21 Rash and other nonspecific skin eruption: Secondary | ICD-10-CM

## 2019-05-03 MED ORDER — CEPHALEXIN 500 MG PO CAPS: 500.0000 mg | ORAL_CAPSULE | Freq: Three times a day (TID) | ORAL | 0 refills | Status: DC

## 2019-05-03 MED ORDER — FAMOTIDINE 20 MG PO TABS: 20.0000 mg | ORAL_TABLET | Freq: Two times a day (BID) | ORAL | 0 refills | Status: DC

## 2019-05-03 MED ORDER — METHYLPREDNISOLONE ACETATE 40 MG/ML IJ SUSP
40.0000 mg | Freq: Once | INTRAMUSCULAR | Status: AC
  Administered 2019-05-03: 40 mg via INTRAMUSCULAR

## 2019-05-03 MED ORDER — CETIRIZINE HCL 10 MG PO TABS: 10.0000 mg | ORAL_TABLET | Freq: Every day | ORAL | 0 refills | Status: DC

## 2019-05-03 NOTE — Progress Notes (Signed)
Kristen [name] is a 74 y.o. female with the following history as recorded in EpicCare:  Patient Active Problem List   Diagnosis Date Noted  . Thiamine deficiency 12/30/2018  . Deficiency anemia 12/20/2018  . Vitamin B12 deficiency anemia due to intrinsic factor deficiency 12/20/2018  . CHF (congestive heart failure), NYHA class I, chronic, combined ([place]) 12/19/2018  . Coronary artery disease involving native coronary artery of native heart with angina pectoris ([place]) 12/19/2018  . Obesity with body mass index (BMI) of 30.0 to 39.9 01/15/2018  . Chronic idiopathic constipation 12/27/2017  . Atrial fibrillation ([place]) 10/30/2017  . Type 2 diabetes mellitus ([place]) 10/30/2017  . Eczema 08/07/2017  . Insomnia secondary to chronic pain 08/04/2016  . Visit for screening mammogram 04/14/2016  . LV dysfunction   . Routine general medical examination at a health care facility 05/03/2013  . DM (diabetes mellitus), type 2 with peripheral vascular complications ([place]) 88/82/8003  . PVC's/nonsustained VT 10/24/2012  . H/O: CVA (cerebrovascular accident) 02/09/2012  . Hypothyroidism 05/27/2011  . Fibromyalgia 10/08/2010  . Long term current use of anticoagulant 05/05/2010  . Low back pain, non-specific 05/12/2008  . Cognitive dysfunction associated with depression 01/29/2007  . Osteoarthritis 11/22/2006  . Hyperlipidemia associated with type 2 diabetes mellitus ([place]) 09/13/2006  . Coronary atherosclerosis 09/13/2006  . Paroxysmal atrial fibrillation ([place]) 09/13/2006  . GERD 09/13/2006  . Hypertension associated with diabetes ([place]) 08/30/2006    Current Outpatient Medications  Medication Sig Dispense Refill  . ALPRAZolam (XANAX) 1 MG tablet Take 1 tablet (1 mg total) by mouth 3 (three) times daily as needed for anxiety. 90 tablet 5  . Ascorbic Acid (VITAMIN C) 1000 MG tablet Take 1,000 mg by mouth daily.    Marland Kitchen atorvastatin (LIPITOR) 40 MG tablet Take 1 tablet (40 mg total) by mouth daily. 90  tablet 1  . Blood Glucose Monitoring Suppl (ONE TOUCH ULTRA 2) w/Device KIT Use as advised 1 each 0  . cephALEXin (KEFLEX) 500 MG capsule Take 1 capsule (500 mg total) by mouth 3 (three) times daily. 15 capsule 0  . cetirizine (ZYRTEC) 10 MG tablet Take 1 tablet (10 mg total) by mouth at bedtime. 20 tablet 0  . Cholecalciferol (VITAMIN D3) 50 MCG (2000 UT) capsule Take 2,000 Units by mouth daily.    . cyclobenzaprine (FLEXERIL) 10 MG tablet Take 10 mg by mouth 3 (three) times daily as needed.     . empagliflozin (JARDIANCE) 25 MG TABS tablet Take 25 mg by mouth daily. 90 tablet 3  . famotidine (PEPCID) 20 MG tablet Take 1 tablet (20 mg total) by mouth 2 (two) times daily. 40 tablet 0  . fluticasone (FLONASE) 50 MCG/ACT nasal spray Place 1 spray into both nostrils daily as needed for allergies or rhinitis.    . furosemide (LASIX) 20 MG tablet Take 1 tablet (20 mg total) by mouth daily. 90 tablet 0  . gabapentin (NEURONTIN) 300 MG capsule TAKE ONE CAPSULE BY MOUTH THREE TIMES DAILY 90 capsule 5  . glipiZIDE (GLUCOTROL) 5 MG tablet Take 10 mg by mouth 2 (two) times daily before a meal.    . Insulin Glargine, 2 Unit Dial, (TOUJEO MAX SOLOSTAR) 300 UNIT/ML SOPN Inject 50 Units into the skin daily. 45 mL 3  . Insulin Pen Needle (NOVOFINE) 32G X 6 MM MISC 1 Act by Does not apply route daily. 100 each 3  . liraglutide (VICTOZA) 18 MG/3ML SOPN Inject 0.3 mLs (1.8 mg total) into the skin every morning. [place]  mL 1  . MAGNESIUM-OXIDE 400 (241.3 Mg) MG tablet TAKE 1 TABLET BY MOUTH TWICE DAILY 60 tablet 9  . metFORMIN (GLUCOPHAGE-XR) 500 MG 24 hr tablet Take 2 tablets (1,000 mg total) by mouth 2 (two) times daily with a meal. 360 tablet 3  . metoprolol succinate (TOPROL-XL) 100 MG 24 hr tablet Take 1 tablet (100 mg total) by mouth 2 (two) times daily. Take with or immediately following a meal. 180 tablet 3  . Multiple Vitamin (MULTIVITAMIN) tablet Take 1 tablet by mouth daily.    . nitroGLYCERIN (NITROSTAT) 0.4  MG SL tablet Place 1 tablet (0.4 mg total) under the tongue every 5 (five) minutes as needed for chest pain (x 3 doses). Reported on 05/08/2015 35 tablet 2  . ondansetron (ZOFRAN-ODT) 4 MG disintegrating tablet Take 1 tablet (4 mg total) by mouth every 8 (eight) hours as needed for nausea or vomiting. 60 tablet 2  . ONE TOUCH ULTRA TEST test strip USE TO TEST BLOOD SUGAR FOUR TIMES DAILY AS DIRECTED 300 each 2  . PERCOCET 10-325 MG tablet Take 1 tablet by mouth 5 (five) times daily as needed.    . potassium chloride SA (K-DUR) 20 MEQ tablet Take 1 tablet (20 mEq total) by mouth 2 (two) times daily. 180 tablet 3  . spironolactone (ALDACTONE) 25 MG tablet TAKE 1 TABLET(25 MG) BY MOUTH TWICE DAILY 180 tablet 0  . telmisartan (MICARDIS) 40 MG tablet Take 1 tablet (40 mg total) by mouth daily. 90 tablet 1  . thiamine 100 MG tablet Take 1 tablet (100 mg total) by mouth daily. 90 tablet 1  . triamcinolone cream (KENALOG) 0.5 % Apply 1 application topically 3 (three) times daily. 60 g 3  . warfarin (COUMADIN) 3 MG tablet Take 1 tablet daily or As directed by anticoagulation clinic 60 tablet 2   Current Facility-Administered Medications  Medication Dose Route Frequency Provider Last Rate Last Admin  . methylPREDNISolone acetate (DEPO-MEDROL) injection 40 mg  40 mg Intramuscular Once Marrian Salvage, FNP        Allergies: Metformin and related, Cleocin [clindamycin hcl], Codeine, Doxycycline hyclate, Macrolides and ketolides, Morphine, Pentazocine lactate, Vibramycin [doxycycline calcium], Definity [perflutren lipid microsphere], and Sulfonamide derivatives  Past Medical History:  Diagnosis Date  . Arthritis   . Blood transfusion without reported diagnosis   . Bursitis   . CAD (coronary artery disease)    a. s/p CABG 2004.b. stable cath 2014 demonstrating stable CAD and continued patency of her LIMA graft.  . Chronic anticoagulation    on coumadin  . DDD (degenerative disc disease), lumbar   .  Depression   . Diabetes mellitus   . Diastolic dysfunction    per echo in October 2012 with EF 50 to 55%  . Fibromyalgia   . GERD (gastroesophageal reflux disease) 10/23/2003  . Headache(784.0)   . Hyperlipidemia   . Hypertension   . Hypokalemia   . LBBB (left bundle branch block)   . Lumbar back pain   . LV dysfunction    a. EF 45% by cath 2014. b. EF 50-55% by technically difficult echo in 08/2014.  Marland Kitchen Lymphadenitis   . Morbid obesity ([place])    a. Sleep study negative for significant OSA in 11/2014.  Marland Kitchen PAF (paroxysmal atrial fibrillation) ([place])   . Stroke [facility]) 2004   affected speech per pt    Past Surgical History:  Procedure Laterality Date  . ABDOMINAL HYSTERECTOMY    . ANGIOPLASTY  laminectomy  . CHOLECYSTECTOMY    .  CORONARY ARTERY BYPASS GRAFT     LIMA to LAD   . KNEE ARTHROSCOPY    . LEFT HEART CATHETERIZATION WITH CORONARY/GRAFT ANGIOGRAM  12/07/2012   Procedure: LEFT HEART CATHETERIZATION WITH Beatrix Fetters;  Surgeon: Blane Ohara, MD;  Location: [facility] CATH LAB;  Service: Cardiovascular;;  . LUMBAR LAMINECTOMY     x3  . SPHINCTEROTOMY    . TONSILLECTOMY      Family History  Problem Relation Age of Onset  . Heart attack Father   . Heart disease Father   . Stroke Mother   . Kidney disease Other   . Stroke Other   . Arthritis Other   . Hypertension Other   . Diabetes Other   . Colon cancer Paternal Uncle   . Depression Sister   . Anxiety disorder Maternal Aunt     Social History   Tobacco Use  . Smoking status: Former Smoker    Quit date: 03/07/2001    Years since quitting: 18.1  . Smokeless tobacco: Never Used  Substance Use Topics  . Alcohol use: No    Subjective:  Suspected reaction to COVID 19 vaccine; received Moderna vaccine over a week ago; has had persisting area of redness at site of injection; she feels like new area of redness has developed over upper arm in the past 24 hours; patient is concerned that her face is swelling but no  difficulty breathing or swallowing; has not taken any type of antihistamine;   Objective:  Vitals:   05/03/19 1441  BP: 126/76  Pulse: 92  Temp: 99.3 F (37.4 C)  TempSrc: Oral  SpO2: 96%  Height: 5' 0.5" (1.537 m)    General: Well developed, well nourished, in no acute distress  Skin : Warm and dry. Localized area of erythema over right upper bicep- no warmth or streaking noted Head: Normocephalic and atraumatic  Eyes: Sclera and conjunctiva clear; pupils round and reactive to light; extraocular movements intact  Ears: External normal; canals clear; tympanic membranes normal  Oropharynx: Pink, supple. No suspicious lesions  Neck: Supple without thyromegaly, adenopathy  Lungs: Respirations unlabored;  Neurologic: Alert and oriented; speech intact; face symmetrical; moves all extremities well; CNII-XII intact without focal deficit   Assessment:  1. Rash     Plan:  Suspect vaccine reaction; Depo-Medrol IM 40 mg given in office- discussed that blood sugar could go up as result of injection; Rx for Pepcid and Zyrtec and Keflex; apply ice to area as well; Patient is coming back Monday for B12 shot- will re-check the area at that time; Do not feel patient should get 2nd vaccine at this time based on reaction;  This visit occurred during the SARS-CoV-2 public health emergency.  Safety protocols were in place, including screening questions prior to the visit, additional usage of staff PPE, and extensive cleaning of exam room while observing appropriate contact time as indicated for disinfecting solutions.       No follow-ups on file.  No orders of the defined types were placed in this encounter.   Requested Prescriptions   Signed Prescriptions Disp Refills  . famotidine (PEPCID) 20 MG tablet 40 tablet 0    Sig: Take 1 tablet (20 mg total) by mouth 2 (two) times daily.  . cetirizine (ZYRTEC) 10 MG tablet 20 tablet 0    Sig: Take 1 tablet (10 mg total) by mouth at bedtime.  .  cephALEXin (KEFLEX) 500 MG capsule 15 capsule 0    Sig: Take 1 capsule (500  mg total) by mouth 3 (three) times daily.

## 2019-05-04 ENCOUNTER — Encounter: Payer: Self-pay | Admitting: Internal Medicine

## 2019-05-04 DIAGNOSIS — E1151 Type 2 diabetes mellitus with diabetic peripheral angiopathy without gangrene: Secondary | ICD-10-CM

## 2019-05-05 MED ORDER — TOUJEO MAX SOLOSTAR 300 UNIT/ML ~~LOC~~ SOPN: 50.0000 [IU] | PEN_INJECTOR | Freq: Every day | SUBCUTANEOUS | 3 refills | Status: DC

## 2019-05-06 ENCOUNTER — Ambulatory Visit (INDEPENDENT_AMBULATORY_CARE_PROVIDER_SITE_OTHER): Payer: Medicare Other | Admitting: *Deleted

## 2019-05-06 ENCOUNTER — Other Ambulatory Visit: Payer: Self-pay | Admitting: Family

## 2019-05-06 ENCOUNTER — Telehealth: Payer: Self-pay | Admitting: Family

## 2019-05-06 ENCOUNTER — Other Ambulatory Visit: Payer: Self-pay

## 2019-05-06 ENCOUNTER — Encounter: Payer: Self-pay | Admitting: Internal Medicine

## 2019-05-06 DIAGNOSIS — D510 Vitamin B12 deficiency anemia due to intrinsic factor deficiency: Secondary | ICD-10-CM

## 2019-05-06 MED ORDER — CYANOCOBALAMIN 1000 MCG/ML IJ SOLN
1000.0000 ug | Freq: Once | INTRAMUSCULAR | Status: AC
  Administered 2019-05-06: 1000 ug via INTRAMUSCULAR

## 2019-05-06 MED ORDER — PREDNISONE 20 MG PO TABS: 20.0000 mg | ORAL_TABLET | Freq: Every day | ORAL | 0 refills | Status: DC

## 2019-05-06 NOTE — Telephone Encounter (Signed)
I was able to re-check left bicep at site of suspected reaction to COVID vaccine; patient notes the arm is feeling much better but she was concerned that area of concern was still red; Area looked much improved to me; marked the area of concern with ink and will plan to re-check in 2 days; extend treatment with prednisone x 5 days; continue Keflex as well.

## 2019-05-06 NOTE — Progress Notes (Addendum)
I have reviewed and agree.

## 2019-05-07 ENCOUNTER — Encounter: Payer: Self-pay | Admitting: Internal Medicine

## 2019-05-07 DIAGNOSIS — E1151 Type 2 diabetes mellitus with diabetic peripheral angiopathy without gangrene: Secondary | ICD-10-CM

## 2019-05-07 MED ORDER — TOUJEO MAX SOLOSTAR 300 UNIT/ML ~~LOC~~ SOPN: 50.0000 [IU] | PEN_INJECTOR | Freq: Every day | SUBCUTANEOUS | 5 refills | Status: DC

## 2019-05-08 ENCOUNTER — Encounter: Payer: Self-pay | Admitting: Family

## 2019-05-08 ENCOUNTER — Ambulatory Visit (INDEPENDENT_AMBULATORY_CARE_PROVIDER_SITE_OTHER): Payer: Medicare Other | Admitting: Family

## 2019-05-08 ENCOUNTER — Other Ambulatory Visit: Payer: Self-pay

## 2019-05-08 VITALS — BP 124/76 | HR 84 | Temp 98.8°F | Ht 60.5 in

## 2019-05-08 DIAGNOSIS — T50Z95D Adverse effect of other vaccines and biological substances, subsequent encounter: Secondary | ICD-10-CM | POA: Diagnosis not present

## 2019-05-08 NOTE — Progress Notes (Signed)
Kristen [name] is a 74 y.o. female with the following history as recorded in EpicCare:  Patient Active Problem List   Diagnosis Date Noted  . Thiamine deficiency 12/30/2018  . Deficiency anemia 12/20/2018  . Vitamin B12 deficiency anemia due to intrinsic factor deficiency 12/20/2018  . CHF (congestive heart failure), NYHA class I, chronic, combined ([place]) 12/19/2018  . Coronary artery disease involving native coronary artery of native heart with angina pectoris ([place]) 12/19/2018  . Obesity with body mass index (BMI) of 30.0 to 39.9 01/15/2018  . Chronic idiopathic constipation 12/27/2017  . Atrial fibrillation ([place]) 10/30/2017  . Type 2 diabetes mellitus ([place]) 10/30/2017  . Eczema 08/07/2017  . Insomnia secondary to chronic pain 08/04/2016  . Visit for screening mammogram 04/14/2016  . LV dysfunction   . Routine general medical examination at a health care facility 05/03/2013  . DM (diabetes mellitus), type 2 with peripheral vascular complications ([place]) 16/12/9602  . PVC's/nonsustained VT 10/24/2012  . H/O: CVA (cerebrovascular accident) 02/09/2012  . Hypothyroidism 05/27/2011  . Fibromyalgia 10/08/2010  . Long term current use of anticoagulant 05/05/2010  . Low back pain, non-specific 05/12/2008  . Cognitive dysfunction associated with depression 01/29/2007  . Osteoarthritis 11/22/2006  . Hyperlipidemia associated with type 2 diabetes mellitus ([place]) 09/13/2006  . Coronary atherosclerosis 09/13/2006  . Paroxysmal atrial fibrillation ([place]) 09/13/2006  . GERD 09/13/2006  . Hypertension associated with diabetes ([place]) 08/30/2006    Current Outpatient Medications  Medication Sig Dispense Refill  . ALPRAZolam (XANAX) 1 MG tablet Take 1 tablet (1 mg total) by mouth 3 (three) times daily as needed for anxiety. 90 tablet 5  . Ascorbic Acid (VITAMIN C) 1000 MG tablet Take 1,000 mg by mouth daily.    Marland Kitchen atorvastatin (LIPITOR) 40 MG tablet Take 1 tablet (40 mg total) by mouth daily. 90  tablet 1  . Blood Glucose Monitoring Suppl (ONE TOUCH ULTRA 2) w/Device KIT Use as advised 1 each 0  . cephALEXin (KEFLEX) 500 MG capsule Take 1 capsule (500 mg total) by mouth 3 (three) times daily. 15 capsule 0  . cetirizine (ZYRTEC) 10 MG tablet Take 1 tablet (10 mg total) by mouth at bedtime. 20 tablet 0  . Cholecalciferol (VITAMIN D3) 50 MCG (2000 UT) capsule Take 2,000 Units by mouth daily.    . cyclobenzaprine (FLEXERIL) 10 MG tablet Take 10 mg by mouth 3 (three) times daily as needed.     . empagliflozin (JARDIANCE) 25 MG TABS tablet Take 25 mg by mouth daily. 90 tablet 3  . famotidine (PEPCID) 20 MG tablet Take 1 tablet (20 mg total) by mouth 2 (two) times daily. 40 tablet 0  . fluticasone (FLONASE) 50 MCG/ACT nasal spray Place 1 spray into both nostrils daily as needed for allergies or rhinitis.    . furosemide (LASIX) 20 MG tablet Take 1 tablet (20 mg total) by mouth daily. 90 tablet 0  . gabapentin (NEURONTIN) 300 MG capsule TAKE ONE CAPSULE BY MOUTH THREE TIMES DAILY 90 capsule 5  . glipiZIDE (GLUCOTROL) 5 MG tablet Take 10 mg by mouth 2 (two) times daily before a meal.    . Insulin Glargine, 2 Unit Dial, (TOUJEO MAX SOLOSTAR) 300 UNIT/ML SOPN Inject 50 Units into the skin daily. 15 mL 5  . Insulin Pen Needle (NOVOFINE) 32G X 6 MM MISC 1 Act by Does not apply route daily. 100 each 3  . liraglutide (VICTOZA) 18 MG/3ML SOPN Inject 0.3 mLs (1.8 mg total) into the skin every morning. [place]  mL 1  . MAGNESIUM-OXIDE 400 (241.3 Mg) MG tablet TAKE 1 TABLET BY MOUTH TWICE DAILY 60 tablet 9  . metFORMIN (GLUCOPHAGE-XR) 500 MG 24 hr tablet Take 2 tablets (1,000 mg total) by mouth 2 (two) times daily with a meal. 360 tablet 3  . metoprolol succinate (TOPROL-XL) 100 MG 24 hr tablet Take 1 tablet (100 mg total) by mouth 2 (two) times daily. Take with or immediately following a meal. 180 tablet 3  . Multiple Vitamin (MULTIVITAMIN) tablet Take 1 tablet by mouth daily.    . nitroGLYCERIN (NITROSTAT) 0.4  MG SL tablet Place 1 tablet (0.4 mg total) under the tongue every 5 (five) minutes as needed for chest pain (x 3 doses). Reported on 05/08/2015 35 tablet 2  . ondansetron (ZOFRAN-ODT) 4 MG disintegrating tablet Take 1 tablet (4 mg total) by mouth every 8 (eight) hours as needed for nausea or vomiting. 60 tablet 2  . ONE TOUCH ULTRA TEST test strip USE TO TEST BLOOD SUGAR FOUR TIMES DAILY AS DIRECTED 300 each 2  . PERCOCET 10-325 MG tablet Take 1 tablet by mouth 5 (five) times daily as needed.    . potassium chloride SA (K-DUR) 20 MEQ tablet Take 1 tablet (20 mEq total) by mouth 2 (two) times daily. 180 tablet 3  . predniSONE (DELTASONE) 20 MG tablet Take 1 tablet (20 mg total) by mouth daily with breakfast. 5 tablet 0  . spironolactone (ALDACTONE) 25 MG tablet TAKE 1 TABLET(25 MG) BY MOUTH TWICE DAILY 180 tablet 0  . telmisartan (MICARDIS) 40 MG tablet Take 1 tablet (40 mg total) by mouth daily. 90 tablet 1  . thiamine 100 MG tablet Take 1 tablet (100 mg total) by mouth daily. 90 tablet 1  . triamcinolone cream (KENALOG) 0.5 % Apply 1 application topically 3 (three) times daily. 60 g 3  . warfarin (COUMADIN) 3 MG tablet Take 1 tablet daily or As directed by anticoagulation clinic 60 tablet 2   No current facility-administered medications for this visit.    Allergies: Metformin and related, Cleocin [clindamycin hcl], Codeine, Doxycycline hyclate, Macrolides and ketolides, Morphine, Pentazocine lactate, Vibramycin [doxycycline calcium], Definity [perflutren lipid microsphere], and Sulfonamide derivatives  Past Medical History:  Diagnosis Date  . Arthritis   . Blood transfusion without reported diagnosis   . Bursitis   . CAD (coronary artery disease)    a. s/p CABG 2004.b. stable cath 2014 demonstrating stable CAD and continued patency of her LIMA graft.  . Chronic anticoagulation    on coumadin  . DDD (degenerative disc disease), lumbar   . Depression   . Diabetes mellitus   . Diastolic  dysfunction    per echo in October 2012 with EF 50 to 55%  . Fibromyalgia   . GERD (gastroesophageal reflux disease) 10/23/2003  . Headache(784.0)   . Hyperlipidemia   . Hypertension   . Hypokalemia   . LBBB (left bundle branch block)   . Lumbar back pain   . LV dysfunction    a. EF 45% by cath 2014. b. EF 50-55% by technically difficult echo in 08/2014.  Marland Kitchen Lymphadenitis   . Morbid obesity ([place])    a. Sleep study negative for significant OSA in 11/2014.  Marland Kitchen PAF (paroxysmal atrial fibrillation) ([place])   . Stroke [facility]) 2004   affected speech per pt    Past Surgical History:  Procedure Laterality Date  . ABDOMINAL HYSTERECTOMY    . ANGIOPLASTY  laminectomy  . CHOLECYSTECTOMY    . CORONARY ARTERY  BYPASS GRAFT     LIMA to LAD   . KNEE ARTHROSCOPY    . LEFT HEART CATHETERIZATION WITH CORONARY/GRAFT ANGIOGRAM  12/07/2012   Procedure: LEFT HEART CATHETERIZATION WITH Beatrix Fetters;  Surgeon: Blane Ohara, MD;  Location: [facility] CATH LAB;  Service: Cardiovascular;;  . LUMBAR LAMINECTOMY     x3  . SPHINCTEROTOMY    . TONSILLECTOMY      Family History  Problem Relation Age of Onset  . Heart attack Father   . Heart disease Father   . Stroke Mother   . Kidney disease Other   . Stroke Other   . Arthritis Other   . Hypertension Other   . Diabetes Other   . Colon cancer Paternal Uncle   . Depression Sister   . Anxiety disorder Maternal Aunt     Social History   Tobacco Use  . Smoking status: Former Smoker    Quit date: 03/07/2001    Years since quitting: 18.1  . Smokeless tobacco: Never Used  Substance Use Topics  . Alcohol use: No    Subjective:  2 day follow-up on allergic reaction to vaccine; on Monday, area was felt to be improving but concerned for persisting erythema; 5 days of oral prednisone was added to Keflex; Very pleased with how her arm is looking today; no pain or tenderness at site at this time.   Objective:  Vitals:   05/08/19 1225  BP: 124/76   Pulse: 84  Temp: 98.8 F (37.1 C)  TempSrc: Oral  SpO2: 97%  Height: 5' 0.5" (1.537 m)    General: Well developed, well nourished, in no acute distress  Skin : Warm and dry. Area of concern appears completely normalized; no redness or streaking noted;  Head: Normocephalic and atraumatic  Neck: Supple without thyromegaly, adenopathy  Lungs: Respirations unlabored;  Neurologic: Alert and oriented; speech intact; face symmetrical;   Assessment:  1. Adverse effect of vaccine, subsequent encounter     Plan:  Appears to have resolved; finish prednisone and antibiotics;  Do not feel patient should take 2nd COVID vaccine at this time; Follow-up with her PCP as scheduled;  This visit occurred during the SARS-CoV-2 public health emergency.  Safety protocols were in place, including screening questions prior to the visit, additional usage of staff PPE, and extensive cleaning of exam room while observing appropriate contact time as indicated for disinfecting solutions.     No follow-ups on file.  No orders of the defined types were placed in this encounter.   Requested Prescriptions    No prescriptions requested or ordered in this encounter

## 2019-05-13 ENCOUNTER — Other Ambulatory Visit: Payer: Self-pay

## 2019-05-13 ENCOUNTER — Ambulatory Visit: Payer: Medicare Other | Admitting: Pharmacist

## 2019-05-13 DIAGNOSIS — E1169 Type 2 diabetes mellitus with other specified complication: Secondary | ICD-10-CM

## 2019-05-13 DIAGNOSIS — E1159 Type 2 diabetes mellitus with other circulatory complications: Secondary | ICD-10-CM

## 2019-05-13 DIAGNOSIS — I5042 Chronic combined systolic (congestive) and diastolic (congestive) heart failure: Secondary | ICD-10-CM

## 2019-05-13 DIAGNOSIS — I25119 Atherosclerotic heart disease of native coronary artery with unspecified angina pectoris: Secondary | ICD-10-CM

## 2019-05-13 DIAGNOSIS — I480 Paroxysmal atrial fibrillation: Secondary | ICD-10-CM

## 2019-05-13 DIAGNOSIS — E1151 Type 2 diabetes mellitus with diabetic peripheral angiopathy without gangrene: Secondary | ICD-10-CM

## 2019-05-13 NOTE — Patient Instructions (Addendum)
Visit Information  Thank you for meeting with me to discuss your medications! I look forward to working with you to achieve your health care goals. Below is a summary of what we talked about during the visit:   Goals Addressed            This Visit's Progress   . Pharmacy Care Plan       Current Barriers:  . Chronic Disease Management support, education, and care coordination needs related to Atrial Fibrillation, CHF, CAD, HTN, HLD, and DMII  Pharmacist Clinical Goal(s):  Marland Kitchen Goal A1c < 8% . Ensure safety, efficacy, and affordability of medications  Interventions: . Comprehensive medication review performed. . Discussed "plate method" to improve diabetic diet . Discussed benefits of med synchronization, packaging and delivery  Patient Self Care Activities:  . Self administers medications as prescribed, Calls pharmacy for medication refills, and Calls provider office for new concerns or questions  Initial goal documentation       Kristen [name] was given information about Chronic Care Management services today including:  1. CCM service includes personalized support from designated clinical staff supervised by her physician, including individualized plan of care and coordination with other care providers 2. 24/7 contact phone numbers for assistance for urgent and routine care needs. 3. Service will only be billed when office clinical staff spend 20 minutes or more in a month to coordinate care. 4. Only one practitioner may furnish and bill the service in a calendar month. 5. The patient may stop CCM services at any time (effective at the end of the month) by phone call to the office staff.  Patient agreed to services and verbal consent obtained.   Patient verbalizes understanding of instructions provided today.  Telephone follow up appointment with pharmacy team member scheduled for:    Charlene Brooke, PharmD Clinical Pharmacist [facility] [phone]   Diabetes Mellitus and Nutrition, Adult When you have diabetes (diabetes mellitus), it is very important to have healthy eating habits because your blood sugar (glucose) levels are greatly affected by what you eat and drink. Eating healthy foods in the appropriate amounts, at about the same times every day, can help you:  Control your blood glucose.  Lower your risk of heart disease.  Improve your blood pressure.  Reach or maintain a healthy weight. Every person with diabetes is different, and each person has different needs for a meal plan. Your health care provider may recommend that you work with a diet and nutrition specialist (dietitian) to make a meal plan that is best for you. Your meal plan may vary depending on factors such as:  The calories you need.  The medicines you take.  Your weight.  Your blood glucose, blood pressure, and cholesterol levels.  Your activity level.  Other health conditions you have, such as heart or kidney disease. How do carbohydrates affect me? Carbohydrates, also called carbs, affect your blood glucose level more than any other type of food. Eating carbs naturally raises the amount of glucose in your blood. Carb counting is a method for keeping track of how many carbs you eat. Counting carbs is important to keep your blood glucose at a healthy level, especially if you use insulin or take certain oral diabetes medicines. It is important to know how many carbs you can safely have in each meal. This is different for every person. Your dietitian can help you calculate how many carbs you should have at each meal and  for each snack. Foods that contain carbs include:  Bread, cereal, rice, pasta, and crackers.  Potatoes and corn.  Peas, beans, and lentils.  Milk and yogurt.  Fruit and juice.  Desserts, such as cakes, cookies, ice cream, and candy. How does alcohol affect me? Alcohol can cause a sudden decrease in blood glucose  (hypoglycemia), especially if you use insulin or take certain oral diabetes medicines. Hypoglycemia can be a life-threatening condition. Symptoms of hypoglycemia (sleepiness, dizziness, and confusion) are similar to symptoms of having too much alcohol. If your health care provider says that alcohol is safe for you, follow these guidelines:  Limit alcohol intake to no more than 1 drink per day for nonpregnant women and 2 drinks per day for men. One drink equals 12 oz of beer, 5 oz of wine, or 1 oz of hard liquor.  Do not drink on an empty stomach.  Keep yourself hydrated with water, diet soda, or unsweetened iced tea.  Keep in mind that regular soda, juice, and other mixers may contain a lot of sugar and must be counted as carbs. What are tips for following this plan?  Reading food labels  Start by checking the serving size on the "Nutrition Facts" label of packaged foods and drinks. The amount of calories, carbs, fats, and other nutrients listed on the label is based on one serving of the item. Many items contain more than one serving per package.  Check the total grams (g) of carbs in one serving. You can calculate the number of servings of carbs in one serving by dividing the total carbs by 15. For example, if a food has 30 g of total carbs, it would be equal to 2 servings of carbs.  Check the number of grams (g) of saturated and trans fats in one serving. Choose foods that have low or no amount of these fats.  Check the number of milligrams (mg) of salt (sodium) in one serving. Most people should limit total sodium intake to less than 2,300 mg per day.  Always check the nutrition information of foods labeled as "low-fat" or "nonfat". These foods may be higher in added sugar or refined carbs and should be avoided.  Talk to your dietitian to identify your daily goals for nutrients listed on the label. Shopping  Avoid buying canned, premade, or processed foods. These foods tend to be high  in fat, sodium, and added sugar.  Shop around the outside edge of the grocery store. This includes fresh fruits and vegetables, bulk grains, fresh meats, and fresh dairy. Cooking  Use low-heat cooking methods, such as baking, instead of high-heat cooking methods like deep frying.  Cook using healthy oils, such as olive, canola, or sunflower oil.  Avoid cooking with butter, cream, or high-fat meats. Meal planning  Eat meals and snacks regularly, preferably at the same times every day. Avoid going long periods of time without eating.  Eat foods high in fiber, such as fresh fruits, vegetables, beans, and whole grains. Talk to your dietitian about how many servings of carbs you can eat at each meal.  Eat 4-6 ounces (oz) of lean protein each day, such as lean meat, chicken, fish, eggs, or tofu. One oz of lean protein is equal to: ? 1 oz of meat, chicken, or fish. ? 1 egg. ?  cup of tofu.  Eat some foods each day that contain healthy fats, such as avocado, nuts, seeds, and fish. Lifestyle  Check your blood glucose regularly.  Exercise regularly  as told by your health care provider. This may include: ? 150 minutes of moderate-intensity or vigorous-intensity exercise each week. This could be brisk walking, biking, or water aerobics. ? Stretching and doing strength exercises, such as yoga or weightlifting, at least 2 times a week.  Take medicines as told by your health care provider.  Do not use any products that contain nicotine or tobacco, such as cigarettes and e-cigarettes. If you need help quitting, ask your health care provider.  Work with a Social worker or diabetes educator to identify strategies to manage stress and any emotional and social challenges. Questions to ask a health care provider  Do I need to meet with a diabetes educator?  Do I need to meet with a dietitian?  What number can I call if I have questions?  When are the best times to check my blood glucose? Where to  find more information:  American Diabetes Association: diabetes.org  Academy of Nutrition and Dietetics: www.eatright.[employer] of Diabetes and Digestive and Kidney Diseases (NIH): [employer] Summary  A healthy meal plan will help you control your blood glucose and maintain a healthy lifestyle.  Working with a diet and nutrition specialist (dietitian) can help you make a meal plan that is best for you.  Keep in mind that carbohydrates (carbs) and alcohol have immediate effects on your blood glucose levels. It is important to count carbs and to use alcohol carefully. This information is not intended to replace advice given to you by your health care provider. Make sure you discuss any questions you have with your health care provider. Document Revised: 02/03/2017 Document Reviewed: 03/28/2016 Elsevier Patient Education  2020 [employer].

## 2019-05-13 NOTE — Progress Notes (Signed)
  I have reviewed this encounter including the documentation in this note and/or discussed this patient with the care management provider. I am certifying that I agree with the content of this note as supervising physician.  Scarlette Calico, MD  05/13/2019

## 2019-05-13 NOTE — Chronic Care Management (AMB) (Signed)
Chronic Care Management Pharmacy  Name: Kristen [name]  MRN: [id number] DOB: 01-08-1946  Chief Complaint/ HPI  Kristen [name],  74 y.o. , female presents for their Initial CCM visit with the clinical pharmacist via telephone due to COVID-19 Pandemic.  PCP : [name], MD  Their chronic conditions include: T2DM, HTN, Afib, CHF, CAD, GERD, HLD, fibromyalgia, insomnia  COVID vaccine allergic reaction. Home bound since last April. Lives with husband. Retired 2005 after injury at work.   Office Visits: 05/08/19 Mickel Baas FNP: allergic reaction to COVID vaccine f/u.  05/06/19 Pt message: requesting 30 day supply of Toujeo d/t cost 04/11/19 Dr Ronnald Ramp OV: wt gain, poor med compliance. Rec'd to stop glipizide and be more compliant with Victoza, SGLT-2 and insulin. Follows with cardiology for CHF.  Consult Visit:  03/12/19 Dr Casimiro Needle (psychiatry): very stable, no med changes.  Medications: Outpatient Encounter Medications as of 05/13/2019  Medication Sig  . ALPRAZolam (XANAX) 1 MG tablet Take 1 tablet (1 mg total) by mouth 3 (three) times daily as needed for anxiety.  . Ascorbic Acid (VITAMIN C) 1000 MG tablet Take 1,000 mg by mouth daily.  Marland Kitchen atorvastatin (LIPITOR) 40 MG tablet Take 1 tablet (40 mg total) by mouth daily.  . Blood Glucose Monitoring Suppl (ONE TOUCH ULTRA 2) w/Device KIT Use as advised  . cetirizine (ZYRTEC) 10 MG tablet Take 1 tablet (10 mg total) by mouth at bedtime.  . Cholecalciferol (VITAMIN D3) 50 MCG (2000 UT) capsule Take 2,000 Units by mouth daily.  . cyclobenzaprine (FLEXERIL) 10 MG tablet Take 10 mg by mouth 3 (three) times daily as needed.   . empagliflozin (JARDIANCE) 25 MG TABS tablet Take 25 mg by mouth daily.  . furosemide (LASIX) 20 MG tablet Take 1 tablet (20 mg total) by mouth daily.  Marland Kitchen gabapentin (NEURONTIN) 300 MG capsule TAKE ONE CAPSULE BY MOUTH THREE TIMES DAILY  . glipiZIDE (GLUCOTROL) 5 MG tablet Take 10 mg by mouth 2 (two) times daily before  a meal.  . Insulin Glargine, 2 Unit Dial, (TOUJEO MAX SOLOSTAR) 300 UNIT/ML SOPN Inject 50 Units into the skin daily.  . Insulin Pen Needle (NOVOFINE) 32G X 6 MM MISC 1 Act by Does not apply route daily.  Marland Kitchen liraglutide (VICTOZA) 18 MG/3ML SOPN Inject 0.3 mLs (1.8 mg total) into the skin every morning.  Marland Kitchen MAGNESIUM-OXIDE 400 (241.3 Mg) MG tablet TAKE 1 TABLET BY MOUTH TWICE DAILY  . metFORMIN (GLUCOPHAGE-XR) 500 MG 24 hr tablet Take 2 tablets (1,000 mg total) by mouth 2 (two) times daily with a meal.  . metoprolol succinate (TOPROL-XL) 100 MG 24 hr tablet Take 1 tablet (100 mg total) by mouth 2 (two) times daily. Take with or immediately following a meal.  . Multiple Vitamin (MULTIVITAMIN) tablet Take 1 tablet by mouth daily.  . ONE TOUCH ULTRA TEST test strip USE TO TEST BLOOD SUGAR FOUR TIMES DAILY AS DIRECTED  . PERCOCET 10-325 MG tablet Take 1 tablet by mouth 5 (five) times daily as needed.  . potassium chloride SA (K-DUR) 20 MEQ tablet Take 1 tablet (20 mEq total) by mouth 2 (two) times daily.  . predniSONE (DELTASONE) 20 MG tablet Take 1 tablet (20 mg total) by mouth daily with breakfast.  . spironolactone (ALDACTONE) 25 MG tablet TAKE 1 TABLET(25 MG) BY MOUTH TWICE DAILY  . telmisartan (MICARDIS) 40 MG tablet Take 1 tablet (40 mg total) by mouth daily.  Marland Kitchen thiamine 100 MG tablet Take 1 tablet (100 mg total)  by mouth daily.  Marland Kitchen triamcinolone cream (KENALOG) 0.5 % Apply 1 application topically 3 (three) times daily.  Marland Kitchen warfarin (COUMADIN) 3 MG tablet Take 1 tablet daily or As directed by anticoagulation clinic  . cephALEXin (KEFLEX) 500 MG capsule Take 1 capsule (500 mg total) by mouth 3 (three) times daily. (Patient not taking: Reported on 05/13/2019)  . famotidine (PEPCID) 20 MG tablet Take 1 tablet (20 mg total) by mouth 2 (two) times daily.  . fluticasone (FLONASE) 50 MCG/ACT nasal spray Place 1 spray into both nostrils daily as needed for allergies or rhinitis.  Marland Kitchen nitroGLYCERIN (NITROSTAT)  0.4 MG SL tablet Place 1 tablet (0.4 mg total) under the tongue every 5 (five) minutes as needed for chest pain (x 3 doses). Reported on 05/08/2015  . ondansetron (ZOFRAN-ODT) 4 MG disintegrating tablet Take 1 tablet (4 mg total) by mouth every 8 (eight) hours as needed for nausea or vomiting.  . [DISCONTINUED] spironolactone (ALDACTONE) 25 MG tablet Take 1 tablet (25 mg total) by mouth 2 (two) times daily.   No facility-administered encounter medications on file as of 05/13/2019.     Current Diagnosis/Assessment:  Goals Addressed            This Visit's Progress   . Pharmacy Care Plan       Current Barriers:  . Chronic Disease Management support, education, and care coordination needs related to Atrial Fibrillation, CHF, CAD, HTN, HLD, and DMII  Pharmacist Clinical Goal(s):  Marland Kitchen Goal A1c < 8% . Ensure safety, efficacy, and affordability of medications  Interventions: . Comprehensive medication review performed. . Discussed "plate method" to improve diabetic diet . Discussed benefits of med synchronization, packaging and delivery  Patient Self Care Activities:  . Self administers medications as prescribed, Calls pharmacy for medication refills, and Calls provider office for new concerns or questions  Initial goal documentation       Diabetes   Recent Relevant Labs: Lab Results  Component Value Date/Time   HGBA1C 9.0 (A) 04/11/2019 02:39 PM   HGBA1C 9.0 (A) 12/19/2018 04:44 PM   HGBA1C 8.4 (H) 08/07/2017 02:52 PM   HGBA1C 8.0 (H) 05/25/2017 02:36 PM   MICROALBUR <0.7 04/11/2019 02:55 PM   MICROALBUR <0.7 12/28/2017 03:51 PM    Checking BG: 2x per Day  Recent FBG Readings: 80s-150 Recent pre-meal BG readings:  Recent 2hr PP BG readings:  225-300s Recent HS BG readings:   Patient has failed these meds in past: n/a Patient is currently uncontrolled on the following medications: Jardiance 25 mg daily, glipizide 10 mg (5 mg x 2) BID, metformin ER 1000 mg BID, Toujeo Max  50 units daily, Victoza 1.8 mg daily   Last diabetic Foot exam:  Lab Results  Component Value Date/Time   HMDIABEYEEXA No Retinopathy 03/21/2019 12:00 AM    Last diabetic Eye exam:  Lab Results  Component Value Date/Time   HMDIABFOOTEX normal 04/02/2014 12:00 AM     We discussed: diet and exercise extensively Diet is main issue - budget issues "addicted to sweets" Pork chops, potatoes, greens, small biscuit Cabbage, peppers, mushrooms, salmon, cornbread    Plan  Continue current medications and control with diet and exercise    Heart Failure/HTN   Office blood pressures are  BP Readings from Last 3 Encounters:  05/08/19 124/76  05/03/19 126/76  04/18/19 122/78    Patient checks BP at home n/a  Patient has failed these meds in past: n/a Patient is currently controlled on the following medications: metoprolol succinate  100 mg BID, telmisartan 40 mg daily, spironolactone 25 mg daily, furosemide 20 mg daily, potassium chloride 20 meq BID, nitroglycerin    We discussed diet and exercise extensively  Plan  Continue current medications and control with diet and exercise   AFIB   Patient is currently rate controlled.  Patient has failed these meds in past: n/a Patient is currently controlled on the following medications: metoprolol succinate 100 mg BID, warfarin 3 mg AD  We discussed:  diet and exercise extensively  Plan  Continue current medications   Hyperlipidemia/CAD   Lipid Panel     Component Value Date/Time   CHOL 126 04/11/2019 1455   CHOL 108 12/26/2017 0758   TRIG 123.0 04/11/2019 1455   HDL 36.70 (L) 04/11/2019 1455   HDL 38 (L) 12/26/2017 0758   CHOLHDL 3 04/11/2019 1455   VLDL 24.6 04/11/2019 1455   LDLCALC 65 04/11/2019 1455   LDLCALC 48 12/26/2017 0758   LABVLDL 22 12/26/2017 0758    Patient has failed these meds in past: n/a Patient is currently controlled on the following medications: atorvastatin 40 mg daily  We discussed:  diet  and exercise extensively  Plan  Continue current medications   Fibromyalgia/chronic pain   Patient has failed these meds in past: n/a Patient is currently controlled on the following medications: cyclobenzaprine 10 mg TID, gabapentin 300 mg TID, Percocet 10-325 mg   We discussed:  Pain prevents her from exercising, meds make it bearable.  Plan  Continue current medications    Anxiety/isomnia   Patient has failed these meds in past: n/a Patient is currently controlled on the following medications: alprazolam 1 mg TID prn  We discussed:  Pt has been on alprazolam for ~20 years, she is worried about withdrawal if she were to stop.  Plan  Continue current medications    GERD   Patient has failed these meds in past: n/a Patient is currently controlled on the following medications: famotidine 20 mg BI  We discussed:  Pt did not report side effects  Plan  Continue current medications   Health Maintenance   Patient is currently controlled on the following medications: Vitamin C, Vitamin D3, Magnesium oxide 400 mg BID, multivitamin, thiamine 100 mg, cetirizine 10 mg daily, triamcinolone cream  We discussed:  Pt is overall satisfied with med regimen  Plan  Continue current medications   Medication Management   Pt uses [facility] for all medications Does not use pill box, keeps meds in various locations in bedroom/kitchen Pt endorses 100% compliance   We discussed:  Pt endorses some non-compliance with Jardiance, Toujeo and Victoza if she is not feeling well or forgets. Pt has had issues with [facility] filling incorrect day supplies. Discussed benefits of med sync, packaging and delivery with [facility]. Pt will think about switching and let me know.  Plan  Continue current med management    Follow up: 1 month phone visit  Charlene Brooke, PharmD Clinical Pharmacist [facility] at [facility] [phone]

## 2019-05-14 DIAGNOSIS — Z79891 Long term (current) use of opiate analgesic: Secondary | ICD-10-CM | POA: Diagnosis not present

## 2019-05-14 DIAGNOSIS — M150 Primary generalized (osteo)arthritis: Secondary | ICD-10-CM | POA: Diagnosis not present

## 2019-05-14 DIAGNOSIS — G894 Chronic pain syndrome: Secondary | ICD-10-CM | POA: Diagnosis not present

## 2019-05-14 DIAGNOSIS — M25512 Pain in left shoulder: Secondary | ICD-10-CM | POA: Diagnosis not present

## 2019-05-14 NOTE — Progress Notes (Signed)
Called patient to follow up on pharmacy discussion. Patient would like to use [facility] for medication synchronization and delivery. Went through all medications and determined patient will not need refills until 06/08/19. Patient will call with any further questions or concerns.  Verbal consent obtained for [facility] enhanced pharmacy services (medication synchronization, adherence packaging, delivery coordination). A medication sync plan was created to allow patient to get all medications delivered once every 30 to 90 days per patient preference. Patient understands they have freedom to choose pharmacy and clinical pharmacist will coordinate care between all prescribers and [facility].  Charlene Brooke, PharmD 05/14/19 4:52 PM

## 2019-05-15 NOTE — Progress Notes (Signed)
Pt called to request test strips, the ones she has are expired and [facility] does not have valid rx on file.  Will coordinate with PCP and [facility] pharmacy to deliver test strips by 05/17/19.  Charlene Brooke, PharmD 05/15/19 4:23 PM

## 2019-05-16 ENCOUNTER — Encounter: Payer: Self-pay | Admitting: Internal Medicine

## 2019-05-16 ENCOUNTER — Other Ambulatory Visit: Payer: Self-pay

## 2019-05-16 ENCOUNTER — Other Ambulatory Visit: Payer: Self-pay | Admitting: Internal Medicine

## 2019-05-16 ENCOUNTER — Telehealth: Payer: Self-pay | Admitting: Internal Medicine

## 2019-05-16 ENCOUNTER — Ambulatory Visit (INDEPENDENT_AMBULATORY_CARE_PROVIDER_SITE_OTHER): Payer: Medicare Other | Admitting: General Practice

## 2019-05-16 ENCOUNTER — Telehealth: Payer: Self-pay

## 2019-05-16 ENCOUNTER — Other Ambulatory Visit: Payer: Self-pay | Admitting: Pharmacist

## 2019-05-16 DIAGNOSIS — I480 Paroxysmal atrial fibrillation: Secondary | ICD-10-CM

## 2019-05-16 DIAGNOSIS — E1151 Type 2 diabetes mellitus with diabetic peripheral angiopathy without gangrene: Secondary | ICD-10-CM

## 2019-05-16 DIAGNOSIS — I152 Hypertension secondary to endocrine disorders: Secondary | ICD-10-CM

## 2019-05-16 DIAGNOSIS — E1159 Type 2 diabetes mellitus with other circulatory complications: Secondary | ICD-10-CM

## 2019-05-16 DIAGNOSIS — Z7901 Long term (current) use of anticoagulants: Secondary | ICD-10-CM

## 2019-05-16 DIAGNOSIS — I2510 Atherosclerotic heart disease of native coronary artery without angina pectoris: Secondary | ICD-10-CM

## 2019-05-16 DIAGNOSIS — E1169 Type 2 diabetes mellitus with other specified complication: Secondary | ICD-10-CM

## 2019-05-16 DIAGNOSIS — K219 Gastro-esophageal reflux disease without esophagitis: Secondary | ICD-10-CM

## 2019-05-16 LAB — POCT INR: INR: 2.1 (ref 2.0–3.0)

## 2019-05-16 MED ORDER — METOPROLOL SUCCINATE ER 100 MG PO TB24: 100.0000 mg | ORAL_TABLET | Freq: Two times a day (BID) | ORAL | 3 refills | Status: DC

## 2019-05-16 MED ORDER — MAGNESIUM OXIDE 400 (241.3 MG) MG PO TABS: 1.0000 | ORAL_TABLET | Freq: Two times a day (BID) | ORAL | 1 refills | Status: DC

## 2019-05-16 MED ORDER — POTASSIUM CHLORIDE CRYS ER 20 MEQ PO TBCR: 20.0000 meq | EXTENDED_RELEASE_TABLET | Freq: Two times a day (BID) | ORAL | 3 refills | Status: DC

## 2019-05-16 NOTE — Telephone Encounter (Signed)
-----   Message from [place], [facility] sent at 05/15/2019  4:14 PM EST ----- Regarding: DM supplies Can you send this patient's test strips into [facility] pharmacy when you get a chance?

## 2019-05-16 NOTE — Telephone Encounter (Signed)
Last office visit 01/15/2018  Cancel/No-show? 3  Future office visit scheduled? No  Please advise on refill.

## 2019-05-16 NOTE — Telephone Encounter (Signed)
MEDICATION: testing supplies  PHARMACY:  New pharmacy named [facility]  IS THIS A 90 DAY SUPPLY : y  IS PATIENT OUT OF MEDICATION: n  IF NOT; HOW MUCH IS LEFT:   LAST APPOINTMENT DATE: @1 /25/2021  NEXT APPOINTMENT DATE:@Visit  date not found  DO WE HAVE YOUR PERMISSION TO LEAVE A DETAILED MESSAGE:  OTHER COMMENTS:  Pt is asking we call in new rx to new pharmacy for what we rx to her. Thank you  **Let patient know to contact pharmacy at the end of the day to make sure medication is ready. **  ** Please notify patient to allow 48-72 hours to process**  **Encourage patient to contact the pharmacy for refills or they can request refills through [facility]**

## 2019-05-16 NOTE — Telephone Encounter (Signed)
Noted  

## 2019-05-16 NOTE — Patient Instructions (Addendum)
Pre visit review using our clinic review tool, if applicable. No additional management support is needed unless otherwise documented below in the visit note.  Continue to take 1 tablet daily except 2 tablets on Mon and Fridays. Re-check in 4 weeks.

## 2019-05-16 NOTE — Telephone Encounter (Signed)
This is now managed by PCP.  Refills per PCP. she was lost for follow-up for me.

## 2019-05-17 NOTE — Progress Notes (Signed)
I have reviewed and agree.

## 2019-05-17 NOTE — Telephone Encounter (Signed)
I will take care of it. Thank you

## 2019-05-17 NOTE — Telephone Encounter (Signed)
Melissa -It was the PCP office that called [place] with the pts request. I will send this to that office to address. Sorry.  Stefannie-Please see the note regarding this request please

## 2019-05-18 MED ORDER — ONETOUCH ULTRASOFT LANCETS MISC: 12 refills | Status: DC

## 2019-05-18 MED ORDER — ATORVASTATIN CALCIUM 40 MG PO TABS: 40.0000 mg | ORAL_TABLET | Freq: Every day | ORAL | 1 refills | Status: DC

## 2019-05-18 MED ORDER — FUROSEMIDE 20 MG PO TABS: 20.0000 mg | ORAL_TABLET | Freq: Every day | ORAL | 1 refills | Status: DC

## 2019-05-18 MED ORDER — TOUJEO MAX SOLOSTAR 300 UNIT/ML ~~LOC~~ SOPN: 50.0000 [IU] | PEN_INJECTOR | Freq: Every day | SUBCUTANEOUS | 5 refills | Status: DC

## 2019-05-18 MED ORDER — SPIRONOLACTONE 25 MG PO TABS: 25.0000 mg | ORAL_TABLET | Freq: Two times a day (BID) | ORAL | 1 refills | Status: DC

## 2019-05-18 MED ORDER — NOVOFINE 32G X 6 MM MISC: 1.0000 | Freq: Every day | 3 refills | Status: DC

## 2019-05-18 MED ORDER — METFORMIN HCL ER 500 MG PO TB24: 1000.0000 mg | ORAL_TABLET | Freq: Two times a day (BID) | ORAL | 1 refills | Status: DC

## 2019-05-18 MED ORDER — COOL BLOOD GLUCOSE TEST STRIPS VI STRP: ORAL_STRIP | 12 refills | Status: DC

## 2019-05-18 MED ORDER — TELMISARTAN 40 MG PO TABS: 40.0000 mg | ORAL_TABLET | Freq: Every day | ORAL | 1 refills | Status: DC

## 2019-05-18 MED ORDER — GABAPENTIN 300 MG PO CAPS: 300.0000 mg | ORAL_CAPSULE | Freq: Three times a day (TID) | ORAL | 5 refills | Status: DC

## 2019-05-18 NOTE — Telephone Encounter (Signed)
[place], [place], [place]  Cairrikier Dian Queen, CMA  This patient is going to use [facility] for meds now. When you get a chance can you send her refills in to [facility]? 90 day supplies preferred.   Atorvastatin 40 mg  Furosemide 20 mg  Gabapentin 300 mg  Toujeo Max Solostar  Metformin ER 500 mg  Spironolactone 25 mg  Telmisartan 40 mg  Warfarin 3 mg  Pen needles  Test strips  Lancets

## 2019-05-18 NOTE — Telephone Encounter (Signed)
erx for listed medication has been sent to [facility] pharmacy, with the exception of the warfarin. Pt follows coumadin clinic. We can send to [facility] if needed, but if the dosing changes patient may not follow new instructions if medication is in a pill pack.   Do you know if they are able to dispense the coumadin separately?

## 2019-05-20 ENCOUNTER — Other Ambulatory Visit: Payer: Self-pay | Admitting: Family

## 2019-05-20 ENCOUNTER — Telehealth: Payer: Self-pay | Admitting: Family

## 2019-05-20 MED ORDER — EPINEPHRINE 0.3 MG/0.3ML IJ SOAJ: 0.3000 mg | INTRAMUSCULAR | 1 refills | Status: DC | PRN

## 2019-05-20 NOTE — Telephone Encounter (Signed)
I wanted to let her know that I was doing some reading and the reaction she had to the vaccine is apparently quite common. The guidelines actually are recommending to get the 2nd dose to allow for full coverage and we can treat the rash as needed.

## 2019-05-21 ENCOUNTER — Ambulatory Visit: Payer: Medicare Other

## 2019-05-23 ENCOUNTER — Ambulatory Visit: Payer: Self-pay | Admitting: Pharmacist

## 2019-05-23 DIAGNOSIS — T50Z95D Adverse effect of other vaccines and biological substances, subsequent encounter: Secondary | ICD-10-CM

## 2019-05-23 NOTE — Chronic Care Management (AMB) (Signed)
I received a call from patient today to discuss COVID vaccine. She heard from Mickel Baas that it is still recommended for her to get 2nd dose even with the rash reaction she had with the 1st, we will just treat the rash. An Epi-pen was sent in to pharmacy and patient was told it would need authorization through insurance. She also received advice from [facility] pharmacist that she should have Benadryl available for 2nd dose.  I contacted [facility] about EpiPen, it will be $47 with insurance and delivered to patient on Monday. I have also requested Benadryl be delivered as well. I advised patient to take 1 Benadryl 30-60 min prior to the 2nd vaccine dose. Pt reports she will schedule 2nd dose once she receives the delivery.  Charlene Brooke, PharmD 05/23/19 4:47 PM

## 2019-05-31 ENCOUNTER — Other Ambulatory Visit: Payer: Self-pay | Admitting: *Deleted

## 2019-05-31 NOTE — Patient Outreach (Signed)
[facility]) Care Management  05/31/2019  Tonisha Joye [birth year] AQ:[id number]   RN Health Coach Monthly Outreach  Referral Date:01/02/2018 Referral Source:MD Referral Screening Reason for Referral:Disease Management Education Insurance:[employer] Medicare   Outreach Attempt:  Outreach attempt #1 to patient for follow up. No answer. RN Health Coach left HIPAA compliant voicemail message along with contact information.  Plan:  RN Health Coach will make another outreach attempt within the month of April if no return call back from patient.  [place] [phone] [email or web address]

## 2019-06-03 ENCOUNTER — Encounter: Payer: Self-pay | Admitting: *Deleted

## 2019-06-03 ENCOUNTER — Other Ambulatory Visit: Payer: Self-pay | Admitting: *Deleted

## 2019-06-03 MED ORDER — WARFARIN SODIUM 3 MG PO TABS: ORAL_TABLET | ORAL | 2 refills | Status: DC

## 2019-06-03 NOTE — Patient Outreach (Signed)
[facility]) Care Management  06/03/2019  Lanasha Sylve October 11, 1945 [id number]   RN Health Coach Monthly Outreach  Referral Date:01/02/2018 Referral Source:MD Referral Screening Reason for Referral:Disease Management Education Insurance:[employer] Medicare   Outreach Attempt:  Successful telephone outreach to patient for follow up.  HIPAA verified with patient.  Patient reporting she is hoping to get her second COVID vaccine today, after adverse reaction from first vaccine, she has been told it is ok to receive second dose and to premedicate with Benadryl.  States she is going to have EPI Pen available also.  Does report her arm is back to normal (no redness or swelling) from previous vaccine.  Continues to weigh daily.  Weight this morning was 214.  Denies any swelling and reports shortness of breath with exertion.  Feels shortness of breath is related to back pain and shoulder pain.  Encouraged patient to discuss pain with provider.  Fasting blood sugar this morning was 129 with recent fasting ranges of 120-130's.  Reports hunger episodes at night.  Discussed bedtime snack.  Patient also reporting she is now working with Pharmacy at primary provider office to help with medication compliance.  Appointments:   Patient last attended appointment with Dr. Ronnald Ramp, primary care provider on 05/08/19 and needs to schedule follow up appointment.  Plan:  RN Health Coach will make next telephone outreach to patient within the month of April and patient agrees to future outreach   [place] [phone] [email or web address]

## 2019-06-03 NOTE — Addendum Note (Signed)
Addended by: Aviva Signs M on: 06/03/2019 11:48 AM   Modules accepted: Orders

## 2019-06-03 NOTE — Telephone Encounter (Signed)
[facility] does dispense warfarin separately in vials due to frequent changes in doses. Can you send the refill in to the pharmacy?

## 2019-06-03 NOTE — Telephone Encounter (Signed)
erx has been sent.  

## 2019-06-06 ENCOUNTER — Ambulatory Visit (INDEPENDENT_AMBULATORY_CARE_PROVIDER_SITE_OTHER): Payer: Medicare Other | Admitting: *Deleted

## 2019-06-06 ENCOUNTER — Other Ambulatory Visit: Payer: Self-pay

## 2019-06-06 DIAGNOSIS — D510 Vitamin B12 deficiency anemia due to intrinsic factor deficiency: Secondary | ICD-10-CM | POA: Diagnosis not present

## 2019-06-06 MED ORDER — CYANOCOBALAMIN 1000 MCG/ML IJ SOLN
1000.0000 ug | Freq: Once | INTRAMUSCULAR | Status: AC
  Administered 2019-06-06: 1000 ug via INTRAMUSCULAR

## 2019-06-06 NOTE — Progress Notes (Addendum)
I have reviewed and agree.

## 2019-06-10 ENCOUNTER — Encounter: Payer: Self-pay | Admitting: Internal Medicine

## 2019-06-10 ENCOUNTER — Telehealth: Payer: Self-pay

## 2019-06-10 ENCOUNTER — Ambulatory Visit: Payer: Medicare Other

## 2019-06-10 DIAGNOSIS — Z794 Long term (current) use of insulin: Secondary | ICD-10-CM

## 2019-06-10 DIAGNOSIS — E1169 Type 2 diabetes mellitus with other specified complication: Secondary | ICD-10-CM

## 2019-06-10 MED ORDER — GLIPIZIDE 5 MG PO TABS: 10.0000 mg | ORAL_TABLET | Freq: Two times a day (BID) | ORAL | 1 refills | Status: DC

## 2019-06-10 NOTE — Telephone Encounter (Signed)
-----   Message from [place], [facility] sent at 05/16/2019  3:04 PM EST ----- Regarding: Med refill Can you send in glipizide to [facility] when you get a chance?

## 2019-06-10 NOTE — Telephone Encounter (Signed)
erx has been sent as requested.  

## 2019-06-13 DIAGNOSIS — M25512 Pain in left shoulder: Secondary | ICD-10-CM | POA: Diagnosis not present

## 2019-06-13 DIAGNOSIS — G894 Chronic pain syndrome: Secondary | ICD-10-CM | POA: Diagnosis not present

## 2019-06-13 DIAGNOSIS — M150 Primary generalized (osteo)arthritis: Secondary | ICD-10-CM | POA: Diagnosis not present

## 2019-06-13 DIAGNOSIS — Z79891 Long term (current) use of opiate analgesic: Secondary | ICD-10-CM | POA: Diagnosis not present

## 2019-06-16 NOTE — Chronic Care Management (AMB) (Signed)
Chronic Care Management Pharmacy  Name: Kristen [name]  MRN: [id number] DOB: 1945/12/19    Chief Complaint/ HPI  Kristen [name],  74 y.o. , female presents for their Follow-Up CCM visit with the clinical pharmacist via telephone due to COVID-19 Pandemic.  PCP : [name], MD  Their chronic conditions include: T2DM, HTN, Afib, CHF, CAD, GERD, HLD, fibromyalgia, insomnia  COVID vaccine allergic reaction. Home bound since last April. Lives with husband. Retired 2005 after injury at work.   Office Visits: 05/08/19 Mickel Baas FNP: allergic reaction to COVID vaccine f/u.  05/06/19 Pt message: requesting 30 day supply of Toujeo d/t cost 04/11/19 Dr Ronnald Ramp OV: wt gain, poor med compliance. Rec'd to stop glipizide and be more compliant with Victoza, SGLT-2 and insulin. Follows with cardiology for CHF.  Consult Visit:  03/12/19 Dr Casimiro Needle (psychiatry): very stable, no med changes.  Medications: Outpatient Encounter Medications as of 06/17/2019  Medication Sig  . ALPRAZolam (XANAX) 1 MG tablet Take 1 tablet (1 mg total) by mouth 3 (three) times daily as needed for anxiety.  . Ascorbic Acid (VITAMIN C) 1000 MG tablet Take 1,000 mg by mouth daily.  Marland Kitchen atorvastatin (LIPITOR) 40 MG tablet Take 1 tablet (40 mg total) by mouth daily.  . Blood Glucose Monitoring Suppl (ONE TOUCH ULTRA 2) w/Device KIT Use as advised  . cephALEXin (KEFLEX) 500 MG capsule Take 1 capsule (500 mg total) by mouth 3 (three) times daily. (Patient not taking: Reported on 05/13/2019)  . cetirizine (ZYRTEC) 10 MG tablet Take 1 tablet (10 mg total) by mouth at bedtime.  . Cholecalciferol (VITAMIN D3) 50 MCG (2000 UT) capsule Take 2,000 Units by mouth daily.  . cyclobenzaprine (FLEXERIL) 10 MG tablet Take 10 mg by mouth 3 (three) times daily as needed.   . empagliflozin (JARDIANCE) 25 MG TABS tablet Take 25 mg by mouth daily.  Marland Kitchen EPINEPHrine 0.3 mg/0.3 mL IJ SOAJ injection Inject 0.3 mLs (0.3 mg total) into the muscle as  needed for anaphylaxis.  . famotidine (PEPCID) 20 MG tablet Take 1 tablet (20 mg total) by mouth 2 (two) times daily.  . fluticasone (FLONASE) 50 MCG/ACT nasal spray Place 1 spray into both nostrils daily as needed for allergies or rhinitis.  . furosemide (LASIX) 20 MG tablet Take 1 tablet (20 mg total) by mouth daily.  Marland Kitchen gabapentin (NEURONTIN) 300 MG capsule Take 1 capsule (300 mg total) by mouth 3 (three) times daily.  Marland Kitchen glipiZIDE (GLUCOTROL) 5 MG tablet Take 2 tablets (10 mg total) by mouth 2 (two) times daily before a meal.  . glucose blood (COOL BLOOD GLUCOSE TEST STRIPS) test strip Use to test blood sugar twice daily. DX: E11.9  . insulin glargine, 2 Unit Dial, (TOUJEO MAX SOLOSTAR) 300 UNIT/ML Solostar Pen Inject 50 Units into the skin daily.  . Insulin Pen Needle (NOVOFINE) 32G X 6 MM MISC 1 Act by Does not apply route daily.  . Lancets (ONETOUCH ULTRASOFT) lancets Use as instructed  . liraglutide (VICTOZA) 18 MG/3ML SOPN Inject 0.3 mLs (1.8 mg total) into the skin every morning.  . magnesium oxide (MAGNESIUM-OXIDE) 400 (241.3 Mg) MG tablet Take 1 tablet (400 mg total) by mouth 2 (two) times daily.  . metFORMIN (GLUCOPHAGE-XR) 500 MG 24 hr tablet Take 2 tablets (1,000 mg total) by mouth 2 (two) times daily with a meal.  . metoprolol succinate (TOPROL-XL) 100 MG 24 hr tablet Take 1 tablet (100 mg total) by mouth 2 (two) times daily. Take with or  immediately following a meal.  . Multiple Vitamin (MULTIVITAMIN) tablet Take 1 tablet by mouth daily.  . nitroGLYCERIN (NITROSTAT) 0.4 MG SL tablet Place 1 tablet (0.4 mg total) under the tongue every 5 (five) minutes as needed for chest pain (x 3 doses). Reported on 05/08/2015  . ondansetron (ZOFRAN-ODT) 4 MG disintegrating tablet Take 1 tablet (4 mg total) by mouth every 8 (eight) hours as needed for nausea or vomiting.  Marland Kitchen PERCOCET 10-325 MG tablet Take 1 tablet by mouth 5 (five) times daily as needed.  . potassium chloride SA (KLOR-CON) 20 MEQ  tablet Take 1 tablet (20 mEq total) by mouth 2 (two) times daily.  Marland Kitchen spironolactone (ALDACTONE) 25 MG tablet Take 1 tablet (25 mg total) by mouth 2 (two) times daily. (Patient taking differently: Take 25 mg by mouth daily. )  . telmisartan (MICARDIS) 40 MG tablet Take 1 tablet (40 mg total) by mouth daily.  Marland Kitchen thiamine 100 MG tablet Take 1 tablet (100 mg total) by mouth daily.  Marland Kitchen triamcinolone cream (KENALOG) 0.5 % Apply 1 application topically 3 (three) times daily.  Marland Kitchen warfarin (COUMADIN) 3 MG tablet Take 1 tablet daily or As directed by anticoagulation clinic  . [DISCONTINUED] spironolactone (ALDACTONE) 25 MG tablet Take 1 tablet (25 mg total) by mouth 2 (two) times daily.   No facility-administered encounter medications on file as of 06/17/2019.     Current Diagnosis/Assessment:  Goals Addressed            This Visit's Progress   . Diabetes: A1c < 8%       CARE PLAN ENTRY (see longitudinal plan of care for additional care plan information)  Current Barriers:  . Diabetes: uncontrolled; complicated by chronic medical conditions including HTN, HLD, CHF, Lab Results  Component Value Date   HGBA1C 9.0 (A) 04/11/2019 .   Lab Results  Component Value Date   CREATININE 0.86 04/11/2019   CREATININE 1.01 12/19/2018   CREATININE 0.96 08/14/2018 .   Marland Kitchen No results found for: EGFR . Current antihyperglycemic regimen: metformin ER 1000 mg BID, Jardiance 25 mg daily, Toujeo 50 units daily, Victoza 1.8 mg daily, glipizide 10 mg BID . denies hypoglycemic symptoms, including dizziness, lightheadedness, shaking, sweating . denies hyperglycemic symptoms, including polyuria, polydipsia, polyphagia, nocturia, blurred vision, neuropathy . Current exercise: none . Current blood glucose readings: 130-190s. Up to 300 when she does not take meds  Pharmacist Clinical Goal(s):  Marland Kitchen Over the next 30 days, patient will work with PharmD and primary care provider to address medications costs and diet  changes  Interventions: . Comprehensive medication review performed, medication list updated in electronic medical record . Discussed plate method, encouraged maximizing vegetables and protein over carbs . Pursue patient assistance for Victoza  Patient Self Care Activities:  . Patient will check blood glucose BID , document, and provide at future appointments . Patient will focus on medication adherence by fill date . Patient will take medications as prescribed . Patient will contact provider with any episodes of hypoglycemia . Patient will report any questions or concerns to provider   Initial goal documentation     . Pharmacy Care Plan   On track    Current Barriers:  . Chronic Disease Management support, education, and care coordination needs related to Atrial Fibrillation, CHF, CAD, HTN, HLD, and DMII  Pharmacist Clinical Goal(s):  . Ensure safety, efficacy, and affordability of medications . Improve ease of medication administration  Interventions: . Comprehensive medication review performed. Marland Kitchen Utilize [facility] pharmacy  for medication synchronization, packaging and delivery  Patient Self Care Activities:  . Self administers medications as prescribed, Calls pharmacy for medication refills, and Calls provider office for new concerns or questions  Please see past updates related to this goal by clicking on the "Past Updates" button in the selected goal        Diabetes   Recent Relevant Labs: Lab Results  Component Value Date/Time   HGBA1C 9.0 (A) 04/11/2019 02:39 PM   HGBA1C 9.0 (A) 12/19/2018 04:44 PM   HGBA1C 8.4 (H) 08/07/2017 02:52 PM   HGBA1C 8.0 (H) 05/25/2017 02:36 PM   MICROALBUR <0.7 04/11/2019 02:55 PM   MICROALBUR <0.7 12/28/2017 03:51 PM    Checking BG: 2x per Day  Recent FBG Readings: 130s Recent pre-meal BG readings:  Recent 2hr PP BG readings: 190s Recent HS BG readings:     Patient has failed these meds in past: n/a Patient is currently  uncontrolled on the following medications: Jardiance 25 mg daily, glipizide 10 mg (5 mg x 2) BID, metformin ER 1000 mg BID, Toujeo Max 50 units daily, Victoza 1.8 mg daily   Last diabetic Foot exam:  Lab Results  Component Value Date/Time   HMDIABEYEEXA No Retinopathy 03/21/2019 12:00 AM    Last diabetic Eye exam:  Lab Results  Component Value Date/Time   HMDIABFOOTEX normal 04/02/2014 12:00 AM     We discussed: diet and exercise extensively Diet is main issue - budget issues "addicted to sweets" Pork chops, potatoes, greens, small biscuit Cabbage, peppers, mushrooms, salmon, cornbread   06/17/19 update: pt is attempting diet changes, "If I'm eating the wrong things I'm only hurting me". She reports indiscretions such as fried fish 3x per week, discussed issues with fried foods. Encouraged plate method. Pt also has difficulty paying for Victoza, we will pursue pt assistance since new application does not require OOP cost minimum.  Plan  Continue current medications and control with diet and exercise  Pursue PAP for Victoza   Heart Failure/HTN   Office blood pressures are  BP Readings from Last 3 Encounters:  05/08/19 124/76  05/03/19 126/76  04/18/19 122/78   Patient checks BP at home n/a  Patient has failed these meds in past: n/a Patient is currently controlled on the following medications: metoprolol succinate 100 mg BID, telmisartan 40 mg daily, spironolactone 25 mg daily, furosemide 20 mg daily, potassium chloride 20 meq BID, nitroglycerin    We discussed diet and exercise extensively   06/17/19 update: Pt is taking spironolactone once daily, rx is for BID so she has a lot of extra. Coordinated with [facility] so patient does not end up with surplus of med.  Plan  Continue current medications and control with diet and exercise   AFIB   Patient is currently rate controlled.  Patient has failed these meds in past: n/a Patient is currently controlled on the  following medications: metoprolol succinate 100 mg BID, warfarin 3 mg AD  We discussed:  diet and exercise extensively  Plan  Continue current medications   Hyperlipidemia/CAD   Lipid Panel     Component Value Date/Time   CHOL 126 04/11/2019 1455   CHOL 108 12/26/2017 0758   TRIG 123.0 04/11/2019 1455   HDL 36.70 (L) 04/11/2019 1455   HDL 38 (L) 12/26/2017 0758   CHOLHDL 3 04/11/2019 1455   VLDL 24.6 04/11/2019 1455   LDLCALC 65 04/11/2019 1455   LDLCALC 48 12/26/2017 0758   LABVLDL 22 12/26/2017 0758    Patient  has failed these meds in past: n/a Patient is currently controlled on the following medications: atorvastatin 40 mg daily  We discussed:  diet and exercise extensively   06/17/19 update: pt reports eating fried fish 3x per week, discussed effects on cholesterol.  Plan  Continue current medications   Fibromyalgia/chronic pain   Patient has failed these meds in past: n/a Patient is currently controlled on the following medications: cyclobenzaprine 10 mg TID, gabapentin 300 mg TID, Percocet 10-325 mg   We discussed:  Pain prevents her from exercising, meds make it bearable.  Plan  Continue current medications    Anxiety/isomnia   Patient has failed these meds in past: n/a Patient is currently controlled on the following medications: alprazolam 1 mg TID prn  We discussed:  Pt has been on alprazolam for ~20 years, she is worried about withdrawal if she were to stop.  Plan  Continue current medications    GERD   Patient has failed these meds in past: n/a Patient is currently controlled on the following medications: famotidine 20 mg BI  We discussed:  Pt did not report side effects  Plan  Continue current medications   Health Maintenance   Patient is currently controlled on the following medications: Vitamin C, Vitamin D3, Magnesium oxide 400 mg BID, multivitamin, thiamine 100 mg, cetirizine 10 mg daily, triamcinolone cream  We discussed:   Pt is overall satisfied with med regimen  Plan  Continue current medications   Medication Management   Pt uses [facility] for all medications Pt endorses 100% compliance   We discussed:  Pt endorses some non-compliance with Jardiance, Toujeo and Victoza if she is not feeling well or forgets. Pt has had issues with [facility] filling incorrect day supplies. Discussed benefits of med sync, packaging and delivery with [facility]. Pt will think about switching and let me know.  06/17/19 update: pt now with [facility], denies issues so far.    Plan  Utilize [facility] pharmacy for medication synchronization, packaging and delivery    Follow up: 1 month phone visit  Charlene Brooke, PharmD Clinical Pharmacist [facility] at [facility] [phone]

## 2019-06-17 ENCOUNTER — Other Ambulatory Visit: Payer: Self-pay

## 2019-06-17 ENCOUNTER — Ambulatory Visit: Payer: Medicare Other | Admitting: Pharmacist

## 2019-06-17 DIAGNOSIS — E1169 Type 2 diabetes mellitus with other specified complication: Secondary | ICD-10-CM

## 2019-06-17 DIAGNOSIS — E1159 Type 2 diabetes mellitus with other circulatory complications: Secondary | ICD-10-CM

## 2019-06-17 DIAGNOSIS — I5042 Chronic combined systolic (congestive) and diastolic (congestive) heart failure: Secondary | ICD-10-CM

## 2019-06-17 NOTE — Patient Instructions (Addendum)
Visit Information   Goals Addressed            This Visit's Progress   . Diabetes: A1c < 8%       CARE PLAN ENTRY (see longitudinal plan of care for additional care plan information)  Current Barriers:  . Diabetes: uncontrolled; complicated by chronic medical conditions including HTN, HLD, CHF, Lab Results  Component Value Date   HGBA1C 9.0 (A) 04/11/2019 .   Lab Results  Component Value Date   CREATININE 0.86 04/11/2019   CREATININE 1.01 12/19/2018   CREATININE 0.96 08/14/2018 .   Marland Kitchen No results found for: EGFR . Current antihyperglycemic regimen: metformin ER 1000 mg BID, Jardiance 25 mg daily, Toujeo 50 units daily, Victoza 1.8 mg daily, glipizide 10 mg BID . denies hypoglycemic symptoms, including dizziness, lightheadedness, shaking, sweating . denies hyperglycemic symptoms, including polyuria, polydipsia, polyphagia, nocturia, blurred vision, neuropathy . Current exercise: none . Current blood glucose readings: 130-190s. Up to 300 when she does not take meds  Pharmacist Clinical Goal(s):  Marland Kitchen Over the next 30 days, patient will work with PharmD and primary care provider to address medications costs and diet changes  Interventions: . Comprehensive medication review performed, medication list updated in electronic medical record . Discussed plate method, encouraged maximizing vegetables and protein over carbs . Pursue patient assistance for Victoza  Patient Self Care Activities:  . Patient will check blood glucose BID , document, and provide at future appointments . Patient will focus on medication adherence by fill date . Patient will take medications as prescribed . Patient will contact provider with any episodes of hypoglycemia . Patient will report any questions or concerns to provider   Initial goal documentation     . Pharmacy Care Plan   On track    Current Barriers:  . Chronic Disease Management support, education, and care coordination needs related to Atrial  Fibrillation, CHF, CAD, HTN, HLD, and DMII  Pharmacist Clinical Goal(s):  . Ensure safety, efficacy, and affordability of medications . Improve ease of medication administration  Interventions: . Comprehensive medication review performed. Marland Kitchen Utilize [facility] pharmacy for medication synchronization, packaging and delivery  Patient Self Care Activities:  . Self administers medications as prescribed, Calls pharmacy for medication refills, and Calls provider office for new concerns or questions  Please see past updates related to this goal by clicking on the "Past Updates" button in the selected goal        The patient verbalized understanding of instructions provided today and declined a print copy of patient instruction materials.   Telephone follow up appointment with pharmacy team member scheduled for: 1 month  Charlene Brooke, PharmD Clinical Pharmacist [facility] at [facility] [phone]    MyPlate from [place] is an outline of a general healthy diet based on the 2010 Dietary Guidelines for Americans, from the U.S. Department of Agriculture Scientist, research (physical sciences)). It sets guidelines for how To follow MyPlate recommendations:  Eat a wide variety of fruits and vegetables, grains, and protein foods.  Serve smaller portions and eat less food throughout the day.  Limit portion sizes to avoid overeating.  Enjoy your food.  Get at least 150 minutes of exercise every week. This is about 30 minutes each day, 5 or more days per week. It can be difficult to have every meal look like MyPlate. Think about MyPlate as eating guidelines for an entire day, rather than each individual meal. Fruits and vegetables  Make half of your plate fruits and vegetables.  Eat many different colors of fruits and vegetables each day.  For a 2,000 calorie daily food plan, eat: ? 2 cups of vegetables every day. ? 2 cups of fruit every day.  1 cup is equal to: ? 1 cup raw or cooked  vegetables. ? 1 cup raw fruit. ? 1 medium-sized orange, apple, or banana. ? 1 cup 100% fruit or vegetable juice. ? 2 cups raw leafy greens, such as lettuce, spinach, or kale. ?  cup dried fruit. Grains  One fourth of your plate should be grains.  Make at least half of the grains you eat each day whole grains.  For a 2,000 calorie daily food plan, eat 6 oz of grains every day.  1 oz is equal to: ? 1 slice bread. ? 1 cup cereal. ?  cup cooked rice, cereal, or pasta. Protein  One fourth of your plate should be protein.  Eat a wide variety of protein foods, including meat, poultry, fish, eggs, beans, nuts, and tofu.  For a 2,000 calorie daily food plan, eat 5 oz of protein every day.  1 oz is equal to: ? 1 oz meat, poultry, or fish. ?  cup cooked beans. ? 1 egg. ?  oz nuts or seeds. ? 1 Tbsp peanut butter. Dairy  Drink fat-free or low-fat (1%) milk.  Eat or drink dairy as a side to meals.  For a 2,000 calorie daily food plan, eat or drink 3 cups of dairy every day.  1 cup is equal to: ? 1 cup milk, yogurt, cottage cheese, or soy milk (soy beverage). ? 2 oz processed cheese. ? 1 oz natural cheese. Fats, oils, salt, and sugars  Only small amounts of oils are recommended.  Avoid foods that are high in calories and low in nutritional value (empty calories), like foods high in fat or added sugars.  Choose foods that are low in salt (sodium). Choose foods that have less than 140 milligrams (mg) of sodium per serving.  Drink water instead of sugary drinks. Drink enough water each day to keep your urine pale yellow. Where to find support  Work with your health care provider or a nutrition specialist (dietitian) to develop a customized eating plan that is right for you.  Download an app (mobile application) to help you track your daily food intake. Where to find more information  Go to [email or web address] for more information. Summary  MyPlate is a general  guideline for healthy eating from the USDA. It is based on the 2010 Dietary Guidelines for Americans.  In general, fruits and vegetables should take up  of your plate, grains should take up  of your plate, and protein should take up  of your plate. This information is not intended to replace advice given to you by your health care provider. Make sure you discuss any questions you have with your health care provider. Document Revised: 07/26/2018 Document Reviewed: 05/23/2016 Elsevier Patient Education  [place].

## 2019-06-17 NOTE — Progress Notes (Signed)
I have collaborated with the care management provider regarding care management and care coordination activities outlined in this encounter and have reviewed this encounter including documentation in the note and care plan. I am certifying that I agree with the content of this note and encounter as supervising physician.  

## 2019-06-21 ENCOUNTER — Telehealth: Payer: Self-pay

## 2019-06-21 ENCOUNTER — Ambulatory Visit: Payer: Self-pay | Admitting: Pharmacist

## 2019-06-21 DIAGNOSIS — E1151 Type 2 diabetes mellitus with diabetic peripheral angiopathy without gangrene: Secondary | ICD-10-CM

## 2019-06-21 DIAGNOSIS — E1159 Type 2 diabetes mellitus with other circulatory complications: Secondary | ICD-10-CM

## 2019-06-21 MED ORDER — VICTOZA 18 MG/3ML ~~LOC~~ SOPN: 1.8000 mg | PEN_INJECTOR | Freq: Every morning | SUBCUTANEOUS | 1 refills | Status: DC

## 2019-06-21 NOTE — Telephone Encounter (Signed)
erx sent as requested.  

## 2019-06-21 NOTE — Telephone Encounter (Signed)
-----   Message from Charlton Haws, [facility] sent at 06/21/2019 11:23 AM EDT ----- Regarding: Victoza refill Can you send Victoza refill to [facility] please?

## 2019-06-21 NOTE — Chronic Care Management (AMB) (Addendum)
  Chronic Care Management   Outreach Note  06/21/2019 Name: Kristen [name] MRN: [id number] DOB: 08-Sep-1945  Referred by: [name], MD Reason for referral : Chronic Care Management (Medication management)   Reviewed chart for medication changes ahead of medication coordination call. . No Office visits, Consults, or Hospital visits since last care coordination call/Pharmacist visit.  . No medication changes indicated   BP Readings from Last 3 Encounters:  05/08/19 124/76  05/03/19 126/76  04/18/19 122/78    Lab Results  Component Value Date   HGBA1C 9.0 (A) 04/11/2019     Medication management via [facility]: Patient obtains medications through V[facility]  90 Days   Last adherence delivery included: Toujeo, Warfarin, metformin, spironolactone, gabapentin, metoprolol, potassium, magnesium - enough provided to reach sync date 06/28/19  Patient is due for next adherence delivery on: 06/28/19. Called patient and reviewed medications and coordinated delivery.  This delivery to include: Percocet 10mg  -325mg  one tablet 5 times daily 30-day supply   Glipizide 5 mg two tablet twice daily  Toujeo Max  U-300 Solostar 300 unit/mL inject 50 units daily  Warfarin 3mg  daily  Metformin Er 500mg  two tablets twice daily  Alprazolam 1mg  one tablet 3 times daily  Telmisartan 40mg  daily  Atorvastatin 40mg  daily  Furosemide 20mg  daily  Gabapentin 300mg  one capsule 3 times daily  Metoprolol Succinate Er 100mg  one tablet twice daily Potassium Cl Er 20 meq one tablet twice daily  Magnesium Oxide 400mg  one tablet twice daily  Patient also needs Victoza refilled, however per pharmacy her copay for 30 day supply is $273 and patient cannot afford. Coordinated with office to get patient samples of Ozempic while we are pursuing patient assistance for Victoza. Patient will pick up samples on Monday 4/19.  Patient declined the following medications: spironolactone -patient is taking  differently than prescribed, only take once a day and has abundant supply on hand.  Confirmed delivery date of 06/28/19, advised patient that pharmacy will contact them the morning of delivery.   Charlene Brooke, PharmD Clinical Pharmacist [facility] [phone]    Update: Received another call from patient, she talked to insurance and they told her Toujeo would be >$200 as well due to coverage gap. However her Medicare plan should provide Toujeo for $35/month during all phases of coverage as part of their 2021 benefits. Toujeo cannot be refilled until next week, will work with pharmacy to ensure copay is $35 as outlined in plan benefits.  Charlene Brooke, PharmD 06/21/19 4:28 PM

## 2019-06-22 NOTE — Progress Notes (Signed)
I have collaborated with the care management provider regarding care management and care coordination activities outlined in this encounter and have reviewed this encounter including documentation in the note and care plan. I am certifying that I agree with the content of this note and encounter as supervising physician.  

## 2019-06-25 ENCOUNTER — Other Ambulatory Visit: Payer: Self-pay | Admitting: Internal Medicine

## 2019-06-25 DIAGNOSIS — E1169 Type 2 diabetes mellitus with other specified complication: Secondary | ICD-10-CM

## 2019-06-26 ENCOUNTER — Ambulatory Visit: Payer: Medicare Other

## 2019-06-27 ENCOUNTER — Ambulatory Visit (INDEPENDENT_AMBULATORY_CARE_PROVIDER_SITE_OTHER): Payer: Medicare Other | Admitting: General Practice

## 2019-06-27 ENCOUNTER — Other Ambulatory Visit: Payer: Self-pay

## 2019-06-27 DIAGNOSIS — Z7901 Long term (current) use of anticoagulants: Secondary | ICD-10-CM

## 2019-06-27 DIAGNOSIS — I480 Paroxysmal atrial fibrillation: Secondary | ICD-10-CM | POA: Diagnosis not present

## 2019-06-27 LAB — POCT INR: INR: 2.6 (ref 2.0–3.0)

## 2019-06-27 NOTE — Patient Instructions (Addendum)
Pre visit review using our clinic review tool, if applicable. No additional management support is needed unless otherwise documented below in the visit note.  Continue to take 1 tablet daily except 2 tablets on Mon and Fridays. Re-check in 6 weeks.  

## 2019-06-27 NOTE — Progress Notes (Signed)
I have reviewed and agree.

## 2019-06-28 ENCOUNTER — Other Ambulatory Visit: Payer: Self-pay | Admitting: *Deleted

## 2019-06-28 ENCOUNTER — Encounter: Payer: Self-pay | Admitting: *Deleted

## 2019-06-28 NOTE — Patient Outreach (Signed)
[facility]) Care Management  [place]  06/28/2019   Kristen [name] 11-13-1945 [id number]   RN Health Coach Monthly Outreach   Referral Date:  01/02/2018 Referral Source:  MD Referral Screening Reason for Referral:  Disease Management Education Insurance:  [employer]    Outreach Attempt:  Successful telephone outreach to patient for follow up.  HIPAA verified with patient.  Patient reporting she has obtained her second COVID vaccine without any adverse effects.  Reporting pain in her left shoulder limiting her abilities due to pain.  States she is scheduled to see Orthopedist beginning of next month.  Patient reporting some shortness of breath with exertion and relates this to pain from shoulder as well.  Denies any swelling in extremities or abdomen.  Has not weighed today due to inability to pick up scale due to shoulder.  Fasting blood sugar this morning was 140 with recent fasting ranges of 80-140's.  Patient stating she is "afraid of hypoglycemic episodes throughout the night".  Discussed eating a bedtime snack.  Confirmed patient has doses of Toujeo and is able to afford prescription refills, states the manufacturer is sending refills to primary office with medication assistance.  Used last dose of Victoza today but has sample of Oxempic to use while applying for medication assistance for Victoza.  States she is able to obtain all other medications and continues to work with Pharmacist at primary care office.  Encounter Medications:  Outpatient Encounter Medications as of 06/28/2019  Medication Sig  . PERCOCET 10-325 MG tablet Take 1 tablet by mouth 5 (five) times daily as needed.  . ALPRAZolam (XANAX) 1 MG tablet Take 1 tablet (1 mg total) by mouth 3 (three) times daily as needed for anxiety.  . Ascorbic Acid (VITAMIN C) 1000 MG tablet Take 1,000 mg by mouth daily.  Marland Kitchen atorvastatin (LIPITOR) 40 MG tablet Take 1 tablet (40 mg total) by  mouth daily.  . Blood Glucose Monitoring Suppl (ONE TOUCH ULTRA 2) w/Device KIT Use as advised  . cephALEXin (KEFLEX) 500 MG capsule Take 1 capsule (500 mg total) by mouth 3 (three) times daily. (Patient not taking: Reported on 05/13/2019)  . cetirizine (ZYRTEC) 10 MG tablet Take 1 tablet (10 mg total) by mouth at bedtime.  . Cholecalciferol (VITAMIN D3) 50 MCG (2000 UT) capsule Take 2,000 Units by mouth daily.  . cyclobenzaprine (FLEXERIL) 10 MG tablet Take 10 mg by mouth 3 (three) times daily as needed.   . empagliflozin (JARDIANCE) 25 MG TABS tablet Take 25 mg by mouth daily.  Marland Kitchen EPINEPHrine 0.3 mg/0.3 mL IJ SOAJ injection Inject 0.3 mLs (0.3 mg total) into the muscle as needed for anaphylaxis.  . famotidine (PEPCID) 20 MG tablet Take 1 tablet (20 mg total) by mouth 2 (two) times daily.  . fluticasone (FLONASE) 50 MCG/ACT nasal spray Place 1 spray into both nostrils daily as needed for allergies or rhinitis.  . furosemide (LASIX) 20 MG tablet Take 1 tablet (20 mg total) by mouth daily.  Marland Kitchen gabapentin (NEURONTIN) 300 MG capsule Take 1 capsule (300 mg total) by mouth 3 (three) times daily.  Marland Kitchen glipiZIDE (GLUCOTROL) 5 MG tablet TAKE TWO TABLETS BY MOUTH TWICE DAILY BEFORE A meal  . glucose blood (COOL BLOOD GLUCOSE TEST STRIPS) test strip Use to test blood sugar twice daily. DX: E11.9  . insulin glargine, 2 Unit Dial, (TOUJEO MAX SOLOSTAR) 300 UNIT/ML Solostar Pen Inject 50 Units into the skin daily.  . Insulin Pen Needle (NOVOFINE)  32G X 6 MM MISC 1 Act by Does not apply route daily.  . Lancets (ONETOUCH ULTRASOFT) lancets Use as instructed  . liraglutide (VICTOZA) 18 MG/3ML SOPN Inject 0.3 mLs (1.8 mg total) into the skin every morning.  . magnesium oxide (MAGNESIUM-OXIDE) 400 (241.3 Mg) MG tablet Take 1 tablet (400 mg total) by mouth 2 (two) times daily.  . metFORMIN (GLUCOPHAGE-XR) 500 MG 24 hr tablet Take 2 tablets (1,000 mg total) by mouth 2 (two) times daily with a meal.  . metoprolol succinate  (TOPROL-XL) 100 MG 24 hr tablet Take 1 tablet (100 mg total) by mouth 2 (two) times daily. Take with or immediately following a meal.  . Multiple Vitamin (MULTIVITAMIN) tablet Take 1 tablet by mouth daily.  . nitroGLYCERIN (NITROSTAT) 0.4 MG SL tablet Place 1 tablet (0.4 mg total) under the tongue every 5 (five) minutes as needed for chest pain (x 3 doses). Reported on 05/08/2015  . ondansetron (ZOFRAN-ODT) 4 MG disintegrating tablet Take 1 tablet (4 mg total) by mouth every 8 (eight) hours as needed for nausea or vomiting.  . potassium chloride SA (KLOR-CON) 20 MEQ tablet Take 1 tablet (20 mEq total) by mouth 2 (two) times daily.  Marland Kitchen spironolactone (ALDACTONE) 25 MG tablet Take 1 tablet (25 mg total) by mouth 2 (two) times daily. (Patient taking differently: Take 25 mg by mouth daily. )  . telmisartan (MICARDIS) 40 MG tablet Take 1 tablet (40 mg total) by mouth daily.  Marland Kitchen thiamine 100 MG tablet Take 1 tablet (100 mg total) by mouth daily.  Marland Kitchen triamcinolone cream (KENALOG) 0.5 % Apply 1 application topically 3 (three) times daily.  Marland Kitchen warfarin (COUMADIN) 3 MG tablet Take 1 tablet daily or As directed by anticoagulation clinic  . [DISCONTINUED] spironolactone (ALDACTONE) 25 MG tablet Take 1 tablet (25 mg total) by mouth 2 (two) times daily.   No facility-administered encounter medications on file as of 06/28/2019.    Functional Status:  In your present state of health, do you have any difficulty performing the following activities: 04/24/2019  Hearing? N  Vision? N  Difficulty concentrating or making decisions? N  Walking or climbing stairs? Y  Comment difficulty climbing stairs due to arthritis in knees  Dressing or bathing? N  Doing errands, shopping? N  Preparing Food and eating ? Y  Comment husband assist with cooking  Using the Toilet? N  In the past six months, have you accidently leaked urine? Y  Comment frequency and some incontinence  Do you have problems with loss of bowel control? N   Managing your Medications? N  Managing your Finances? N  Housekeeping or managing your Housekeeping? Y  Comment husband assist with cleaning  Some recent data might be hidden    Fall/Depression Screening: Fall Risk  06/28/2019 06/03/2019 03/20/2019  Falls in the past year? 0 0 0  Number falls in past yr: 0 0 -  Injury with Fall? 0 0 -  Risk for fall due to : Impaired balance/gait;Impaired mobility;Medication side effect;Impaired vision Medication side effect;Orthopedic patient;Impaired mobility;Impaired balance/gait Medication side effect;Impaired balance/gait;Impaired mobility;Orthopedic patient  Follow up Falls evaluation completed;Education provided;Falls prevention discussed Falls evaluation completed;Education provided;Falls prevention discussed Falls prevention discussed;Education provided;Falls evaluation completed   PHQ 2/9 Scores 04/24/2019 07/05/2018 02/16/2018 01/02/2018 12/29/2017 08/07/2017 10/12/2016  PHQ - 2 Score 0 1 1 0 0 0 1  PHQ- 9 Score - 10 - - - 2 5   THN CM Care Plan Problem One     Most Recent  Value  Care Plan Problem One  Knowledge and financial deficiets related to self care management of diabetes and heart failure  Role Documenting the Problem One  [place] for Problem One  Active  [facility] Long Term Goal   Patient will report a decrease in A1C by 0.2 points within the next 90 days. (9)  THN Long Term Goal Start Date  04/24/19  Interventions for Problem One Long Term Goal  Reviewed and discussed care plan and goals, reviewed medications and encouraged medication compliance, encouraged to monitor blood sugars and document readings along with insulin doses and food intake to track hypoglycemia and to use [facility] Calendar booklet to document information, signs and symptoms of hypoglycemia discussed, encouraged healthier food and drink options, discussed bedtime snack to help prevent hypoglycemia, encouraged patient to continue to work with Pharmacy for medication  assistance, encouraged patient to verify when orthopedist appointment is scheduled and to attend appointment, pain management discussed, reviewed signs and symptoms of heart failure, encouraged to weigh daily      Appointments:  Attended appointment at primary care on 05/08/2019.  Unsure of date for orthopedic appointment, encouraged to contact office to verify appointment.  Plan: RN Health Coach will send primary care provider quarterly update. RN Health Coach will make next telephone outreach to patient within the month of May and patient agreeable to future outreach.  [place] [phone] [email or web address]

## 2019-07-05 ENCOUNTER — Other Ambulatory Visit: Payer: Self-pay | Admitting: Internal Medicine

## 2019-07-05 DIAGNOSIS — E1169 Type 2 diabetes mellitus with other specified complication: Secondary | ICD-10-CM

## 2019-07-05 DIAGNOSIS — M19012 Primary osteoarthritis, left shoulder: Secondary | ICD-10-CM | POA: Diagnosis not present

## 2019-07-05 DIAGNOSIS — Z794 Long term (current) use of insulin: Secondary | ICD-10-CM

## 2019-07-05 DIAGNOSIS — E785 Hyperlipidemia, unspecified: Secondary | ICD-10-CM

## 2019-07-10 ENCOUNTER — Other Ambulatory Visit: Payer: Self-pay

## 2019-07-10 ENCOUNTER — Telehealth (INDEPENDENT_AMBULATORY_CARE_PROVIDER_SITE_OTHER): Payer: Medicare Other | Admitting: Psychiatry

## 2019-07-10 DIAGNOSIS — Z79891 Long term (current) use of opiate analgesic: Secondary | ICD-10-CM | POA: Diagnosis not present

## 2019-07-10 DIAGNOSIS — F411 Generalized anxiety disorder: Secondary | ICD-10-CM

## 2019-07-10 DIAGNOSIS — G894 Chronic pain syndrome: Secondary | ICD-10-CM | POA: Diagnosis not present

## 2019-07-10 DIAGNOSIS — M25512 Pain in left shoulder: Secondary | ICD-10-CM | POA: Diagnosis not present

## 2019-07-10 DIAGNOSIS — M150 Primary generalized (osteo)arthritis: Secondary | ICD-10-CM | POA: Diagnosis not present

## 2019-07-10 MED ORDER — ALPRAZOLAM 1 MG PO TABS: 1.0000 mg | ORAL_TABLET | Freq: Three times a day (TID) | ORAL | 5 refills | Status: DC | PRN

## 2019-07-10 NOTE — Progress Notes (Signed)
She is only fair. None Patient ID: Kristen [name], female   DOB: 1945/12/27, 74 y.o.   MRN: [id number] [facility] MD Progress Note  07/10/2019 2:27 PM Kristen [name]  MRN:  [id number] Subjective:  Scared Principal Problem: Adjustment disorder with an anxious mood state  Today the patient is doing fairly well.  The decision is to have her shoulder surgery.  She is very scared about it.  She is on a blood thinner because she had a stroke in the distant past.  The patient certainly should get a cardiology clearance but she has to make a decision if she wants to have the surgery.  Overall she is doing well she is got no depression no anxiety.  She has had no falls.  She drinks no alcohol uses no drugs.  Her anxiety is reasonably well controlled even with the pandemic.  She lives with her husband and is doing well.  Past Medical History:  Diagnosis Date  . Arthritis   . Blood transfusion without reported diagnosis   . Bursitis   . CAD (coronary artery disease)    a. s/p CABG 2004.b. stable cath 2014 demonstrating stable CAD and continued patency of her LIMA graft.  . Chronic anticoagulation    on coumadin  . DDD (degenerative disc disease), lumbar   . Depression   . Diabetes mellitus   . Diastolic dysfunction    per echo in October 2012 with EF 50 to 55%  . Fibromyalgia   . GERD (gastroesophageal reflux disease) 10/23/2003  . Headache(784.0)   . Hyperlipidemia   . Hypertension   . Hypokalemia   . LBBB (left bundle branch block)   . Lumbar back pain   . LV dysfunction    a. EF 45% by cath 2014. b. EF 50-55% by technically difficult echo in 08/2014.  Marland Kitchen Lymphadenitis   . Morbid obesity ([place])    a. Sleep study negative for significant OSA in 11/2014.  Marland Kitchen PAF (paroxysmal atrial fibrillation) ([place])   . Stroke [facility]) 2004   affected speech per pt    Past Surgical History:  Procedure Laterality Date  . ABDOMINAL HYSTERECTOMY    . ANGIOPLASTY  laminectomy  . CHOLECYSTECTOMY    .  CORONARY ARTERY BYPASS GRAFT     LIMA to LAD   . KNEE ARTHROSCOPY    . LEFT HEART CATHETERIZATION WITH CORONARY/GRAFT ANGIOGRAM  12/07/2012   Procedure: LEFT HEART CATHETERIZATION WITH Beatrix Fetters;  Surgeon: Blane Ohara, MD;  Location: [facility] CATH LAB;  Service: Cardiovascular;;  . LUMBAR LAMINECTOMY     x3  . SPHINCTEROTOMY    . TONSILLECTOMY     Family History:  Family History  Problem Relation Age of Onset  . Heart attack Father   . Heart disease Father   . Stroke Mother   . Kidney disease Other   . Stroke Other   . Arthritis Other   . Hypertension Other   . Diabetes Other   . Colon cancer Paternal Uncle   . Depression Sister   . Anxiety disorder Maternal Aunt    Family Psychiatric  History:  Social History:  Social History   Substance and Sexual Activity  Alcohol Use No     Social History   Substance and Sexual Activity  Drug Use No    Social History   Socioeconomic History  . Marital status: Married    Spouse name: Not on file  . Number of children: Not on file  . Years of  education: Not on file  . Highest education level: Not on file  Occupational History  . Not on file  Tobacco Use  . Smoking status: Former Smoker    Quit date: 03/07/2001    Years since quitting: 18.3  . Smokeless tobacco: Never Used  Substance and Sexual Activity  . Alcohol use: No  . Drug use: No  . Sexual activity: Not Currently  Other Topics Concern  . Not on file  Social History Narrative   Lives locally, has help available if needed.   Social Determinants of Health   Financial Resource Strain: Low Risk   . Difficulty of Paying Living Expenses: Not hard at all  Food Insecurity: No Food Insecurity  . Worried About Charity fundraiser in the Last Year: Never true  . Ran Out of Food in the Last Year: Never true  Transportation Needs: No Transportation Needs  . Lack of Transportation (Medical): No  . Lack of Transportation (Non-Medical): No  Physical Activity:  Inactive  . Days of Exercise per Week: 0 days  . Minutes of Exercise per Session: 0 min  Stress:   . Feeling of Stress :   Social Connections:   . Frequency of Communication with Friends and Family:   . Frequency of Social Gatherings with Friends and Family:   . Attends Religious Services:   . Active Member of Clubs or Organizations:   . Attends Archivist Meetings:   Marland Kitchen Marital Status:    Additional Social History:                         Sleep: Fair  Appetite:  Fair  Current Medications: Current Outpatient Medications  Medication Sig Dispense Refill  . ALPRAZolam (XANAX) 1 MG tablet Take 1 tablet (1 mg total) by mouth 3 (three) times daily as needed for anxiety. 90 tablet 5  . Ascorbic Acid (VITAMIN C) 1000 MG tablet Take 1,000 mg by mouth daily.    Marland Kitchen atorvastatin (LIPITOR) 40 MG tablet TAKE 1 TABLET(40 MG) BY MOUTH DAILY 90 tablet 1  . Blood Glucose Monitoring Suppl (ONE TOUCH ULTRA 2) w/Device KIT Use as advised 1 each 0  . cephALEXin (KEFLEX) 500 MG capsule Take 1 capsule (500 mg total) by mouth 3 (three) times daily. (Patient not taking: Reported on 05/13/2019) 15 capsule 0  . cetirizine (ZYRTEC) 10 MG tablet Take 1 tablet (10 mg total) by mouth at bedtime. 20 tablet 0  . Cholecalciferol (VITAMIN D3) 50 MCG (2000 UT) capsule Take 2,000 Units by mouth daily.    . cyclobenzaprine (FLEXERIL) 10 MG tablet Take 10 mg by mouth 3 (three) times daily as needed.     . empagliflozin (JARDIANCE) 25 MG TABS tablet Take 25 mg by mouth daily. 90 tablet 3  . EPINEPHrine 0.3 mg/0.3 mL IJ SOAJ injection Inject 0.3 mLs (0.3 mg total) into the muscle as needed for anaphylaxis. 1 each 1  . famotidine (PEPCID) 20 MG tablet Take 1 tablet (20 mg total) by mouth 2 (two) times daily. 40 tablet 0  . fluticasone (FLONASE) 50 MCG/ACT nasal spray Place 1 spray into both nostrils daily as needed for allergies or rhinitis.    . furosemide (LASIX) 20 MG tablet Take 1 tablet (20 mg total) by  mouth daily. 90 tablet 1  . gabapentin (NEURONTIN) 300 MG capsule Take 1 capsule (300 mg total) by mouth 3 (three) times daily. 90 capsule 5  . glipiZIDE (GLUCOTROL) 5 MG  tablet TAKE TWO TABLETS BY MOUTH TWICE DAILY BEFORE A meal 180 tablet 1  . glucose blood (COOL BLOOD GLUCOSE TEST STRIPS) test strip Use to test blood sugar twice daily. DX: E11.9 100 each 12  . insulin glargine, 2 Unit Dial, (TOUJEO MAX SOLOSTAR) 300 UNIT/ML Solostar Pen Inject 50 Units into the skin daily. 15 mL 5  . Insulin Pen Needle (NOVOFINE) 32G X 6 MM MISC 1 Act by Does not apply route daily. 100 each 3  . Lancets (ONETOUCH ULTRASOFT) lancets Use as instructed 100 each 12  . liraglutide (VICTOZA) 18 MG/3ML SOPN Inject 0.3 mLs (1.8 mg total) into the skin every morning. 27 mL 1  . magnesium oxide (MAGNESIUM-OXIDE) 400 (241.3 Mg) MG tablet Take 1 tablet (400 mg total) by mouth 2 (two) times daily. 180 tablet 1  . metFORMIN (GLUCOPHAGE-XR) 500 MG 24 hr tablet Take 2 tablets (1,000 mg total) by mouth 2 (two) times daily with a meal. 360 tablet 1  . metoprolol succinate (TOPROL-XL) 100 MG 24 hr tablet Take 1 tablet (100 mg total) by mouth 2 (two) times daily. Take with or immediately following a meal. 180 tablet 3  . Multiple Vitamin (MULTIVITAMIN) tablet Take 1 tablet by mouth daily.    . nitroGLYCERIN (NITROSTAT) 0.4 MG SL tablet Place 1 tablet (0.4 mg total) under the tongue every 5 (five) minutes as needed for chest pain (x 3 doses). Reported on 05/08/2015 35 tablet 2  . ondansetron (ZOFRAN-ODT) 4 MG disintegrating tablet Take 1 tablet (4 mg total) by mouth every 8 (eight) hours as needed for nausea or vomiting. 60 tablet 2  . PERCOCET 10-325 MG tablet Take 1 tablet by mouth 5 (five) times daily as needed.    . potassium chloride SA (KLOR-CON) 20 MEQ tablet Take 1 tablet (20 mEq total) by mouth 2 (two) times daily. 180 tablet 3  . spironolactone (ALDACTONE) 25 MG tablet Take 1 tablet (25 mg total) by mouth 2 (two) times daily.  (Patient taking differently: Take 25 mg by mouth daily. ) 180 tablet 1  . telmisartan (MICARDIS) 40 MG tablet Take 1 tablet (40 mg total) by mouth daily. 90 tablet 1  . thiamine 100 MG tablet Take 1 tablet (100 mg total) by mouth daily. 90 tablet 1  . triamcinolone cream (KENALOG) 0.5 % Apply 1 application topically 3 (three) times daily. 60 g 3  . warfarin (COUMADIN) 3 MG tablet Take 1 tablet daily or As directed by anticoagulation clinic 60 tablet 2   No current facility-administered medications for this visit.    Lab Results:  No results found. However, due to the size of the patient record, not all encounters were searched. Please check Results Review for a complete set of results.  Physical Findings: AIMS:  , ,  ,  ,    CIWA:    COWS:     Musculoskeletal: Strength & Muscle Tone: within normal limits Gait & Station: normal Patient leans: Right  Psychiatric Specialty Exam: ROS  There were no vitals taken for this visit.There is no height or weight on file to calculate BMI.  General Appearance: NA  Eye Contact::  Good  Speech:  Clear and Coherent  Volume:  Normal  Mood:  NA  Affect:  Congruent  Thought Process:  Coherent  Orientation:  Full (Time, Place, and Person)  Thought Content:  WDL  Suicidal Thoughts:  No  Homicidal Thoughts:  No  Memory:  NA  Judgement:  Good  Insight:  Good  Psychomotor Activity:  Normal  Concentration:  Good  Recall:  Good  Fund of Knowledge:Fair  Language: Good  Akathisia:  No  Handed:  Left  AIMS (if indicated):     Assets:  Desire for Improvement  ADL's:  Intact  Cognition: WNL  Sleep:      At this time the patient is doing well.  Her #1 problem is generalized anxiety disorder.  At this time she will continue taking Xanax 1 mg 3 times daily.  I will see her again in 5 months.

## 2019-07-11 ENCOUNTER — Ambulatory Visit (INDEPENDENT_AMBULATORY_CARE_PROVIDER_SITE_OTHER): Payer: Medicare Other | Admitting: *Deleted

## 2019-07-11 ENCOUNTER — Other Ambulatory Visit: Payer: Self-pay

## 2019-07-11 DIAGNOSIS — E538 Deficiency of other specified B group vitamins: Secondary | ICD-10-CM | POA: Diagnosis not present

## 2019-07-11 MED ORDER — CYANOCOBALAMIN 1000 MCG/ML IJ SOLN
1000.0000 ug | Freq: Once | INTRAMUSCULAR | Status: AC
  Administered 2019-07-11: 1000 ug via INTRAMUSCULAR

## 2019-07-11 NOTE — Progress Notes (Addendum)
Pls cosign for B12 inj../lmb I have reviewed and agree  

## 2019-07-12 ENCOUNTER — Encounter: Payer: Self-pay | Admitting: Internal Medicine

## 2019-07-17 ENCOUNTER — Ambulatory Visit: Payer: Medicare Other | Admitting: Pharmacist

## 2019-07-17 DIAGNOSIS — I5042 Chronic combined systolic (congestive) and diastolic (congestive) heart failure: Secondary | ICD-10-CM

## 2019-07-17 DIAGNOSIS — E1159 Type 2 diabetes mellitus with other circulatory complications: Secondary | ICD-10-CM

## 2019-07-17 DIAGNOSIS — E1169 Type 2 diabetes mellitus with other specified complication: Secondary | ICD-10-CM

## 2019-07-17 DIAGNOSIS — I480 Paroxysmal atrial fibrillation: Secondary | ICD-10-CM

## 2019-07-17 DIAGNOSIS — E785 Hyperlipidemia, unspecified: Secondary | ICD-10-CM

## 2019-07-18 ENCOUNTER — Other Ambulatory Visit: Payer: Self-pay

## 2019-07-18 NOTE — Chronic Care Management (AMB) (Signed)
Chronic Care Management Pharmacy  Name: Kristen [name]  MRN: [id number] DOB: 1945-09-25    Chief Complaint/ HPI  Kristen [name],  74 y.o. , female presents for their Follow-Up CCM visit with the clinical pharmacist via telephone due to COVID-19 Pandemic.  PCP : [name], MD  Their chronic conditions include: T2DM, HTN, Afib, CHF, CAD, GERD, HLD, fibromyalgia, insomnia  COVID vaccine allergic reaction. Home bound since last April. Lives with husband. Retired 2005 after injury at work.   Office Visits: 05/08/19 Kristen Baas FNP: allergic reaction to COVID vaccine f/u.  05/06/19 Pt message: requesting 30 day supply of Toujeo d/t cost 04/11/19 Dr Ronnald Ramp OV: wt gain, poor med compliance. Rec'd to stop glipizide and be more compliant with Victoza, SGLT-2 and insulin. Follows with cardiology for CHF.  Consult Visit:  03/12/19 Dr Casimiro Needle (psychiatry): very stable, no med changes.  Allergies  Allergen Reactions  . Metformin And Related Other (See Comments)    Must take XR form only, cannot tolerate Regular release metformin  . Cleocin [Clindamycin Hcl] Diarrhea  . Codeine Itching  . Doxycycline Hyclate Diarrhea  . Macrolides And Ketolides Diarrhea  . Morphine Hives  . Pentazocine Lactate Itching and Nausea And Vomiting    (GENERIC- [place])  . Vibramycin [Doxycycline Calcium] Diarrhea and Rash  . Definity [Perflutren Lipid Microsphere] Other (See Comments)    Patient complained of warm sensation in chest x few seconds duration when injected Definity.No other symptoms  . Sulfonamide Derivatives Rash     Medications: Outpatient Encounter Medications as of 07/17/2019  Medication Sig  . ALPRAZolam (XANAX) 1 MG tablet Take 1 tablet (1 mg total) by mouth 3 (three) times daily as needed for anxiety.  . Ascorbic Acid (VITAMIN C) 1000 MG tablet Take 1,000 mg by mouth daily.  Marland Kitchen atorvastatin (LIPITOR) 40 MG tablet TAKE 1 TABLET(40 MG) BY MOUTH DAILY  . Blood Glucose Monitoring Suppl  (ONE TOUCH ULTRA 2) w/Device KIT Use as advised  . cephALEXin (KEFLEX) 500 MG capsule Take 1 capsule (500 mg total) by mouth 3 (three) times daily. (Patient not taking: Reported on 05/13/2019)  . cetirizine (ZYRTEC) 10 MG tablet Take 1 tablet (10 mg total) by mouth at bedtime.  . Cholecalciferol (VITAMIN D3) 50 MCG (2000 UT) capsule Take 2,000 Units by mouth daily.  . cyclobenzaprine (FLEXERIL) 10 MG tablet Take 10 mg by mouth 3 (three) times daily as needed.   . empagliflozin (JARDIANCE) 25 MG TABS tablet Take 25 mg by mouth daily.  Marland Kitchen EPINEPHrine 0.3 mg/0.3 mL IJ SOAJ injection Inject 0.3 mLs (0.3 mg total) into the muscle as needed for anaphylaxis.  . famotidine (PEPCID) 20 MG tablet Take 1 tablet (20 mg total) by mouth 2 (two) times daily.  . fluticasone (FLONASE) 50 MCG/ACT nasal spray Place 1 spray into both nostrils daily as needed for allergies or rhinitis.  . furosemide (LASIX) 20 MG tablet Take 1 tablet (20 mg total) by mouth daily.  Marland Kitchen gabapentin (NEURONTIN) 300 MG capsule Take 1 capsule (300 mg total) by mouth 3 (three) times daily.  Marland Kitchen glipiZIDE (GLUCOTROL) 5 MG tablet TAKE TWO TABLETS BY MOUTH TWICE DAILY BEFORE A meal  . glucose blood (COOL BLOOD GLUCOSE TEST STRIPS) test strip Use to test blood sugar twice daily. DX: E11.9  . insulin glargine, 2 Unit Dial, (TOUJEO MAX SOLOSTAR) 300 UNIT/ML Solostar Pen Inject 50 Units into the skin daily.  . Insulin Pen Needle (NOVOFINE) 32G X 6 MM MISC 1 Act by Does  not apply route daily.  . Lancets (ONETOUCH ULTRASOFT) lancets Use as instructed  . liraglutide (VICTOZA) 18 MG/3ML SOPN Inject 0.3 mLs (1.8 mg total) into the skin every morning.  . magnesium oxide (MAGNESIUM-OXIDE) 400 (241.3 Mg) MG tablet Take 1 tablet (400 mg total) by mouth 2 (two) times daily.  . metFORMIN (GLUCOPHAGE-XR) 500 MG 24 hr tablet Take 2 tablets (1,000 mg total) by mouth 2 (two) times daily with a meal.  . metoprolol succinate (TOPROL-XL) 100 MG 24 hr tablet Take 1 tablet  (100 mg total) by mouth 2 (two) times daily. Take with or immediately following a meal.  . Multiple Vitamin (MULTIVITAMIN) tablet Take 1 tablet by mouth daily.  . nitroGLYCERIN (NITROSTAT) 0.4 MG SL tablet Place 1 tablet (0.4 mg total) under the tongue every 5 (five) minutes as needed for chest pain (x 3 doses). Reported on 05/08/2015  . ondansetron (ZOFRAN-ODT) 4 MG disintegrating tablet Take 1 tablet (4 mg total) by mouth every 8 (eight) hours as needed for nausea or vomiting.  Marland Kitchen PERCOCET 10-325 MG tablet Take 1 tablet by mouth 5 (five) times daily as needed.  . potassium chloride SA (KLOR-CON) 20 MEQ tablet Take 1 tablet (20 mEq total) by mouth 2 (two) times daily.  Marland Kitchen spironolactone (ALDACTONE) 25 MG tablet Take 1 tablet (25 mg total) by mouth 2 (two) times daily. (Patient taking differently: Take 25 mg by mouth daily. )  . telmisartan (MICARDIS) 40 MG tablet Take 1 tablet (40 mg total) by mouth daily.  Marland Kitchen thiamine 100 MG tablet Take 1 tablet (100 mg total) by mouth daily.  Marland Kitchen triamcinolone cream (KENALOG) 0.5 % Apply 1 application topically 3 (three) times daily.  Marland Kitchen warfarin (COUMADIN) 3 MG tablet Take 1 tablet daily or As directed by anticoagulation clinic  . [DISCONTINUED] atorvastatin (LIPITOR) 40 MG tablet Take 1 tablet (40 mg total) by mouth daily.  . [DISCONTINUED] spironolactone (ALDACTONE) 25 MG tablet Take 1 tablet (25 mg total) by mouth 2 (two) times daily.   No facility-administered encounter medications on file as of 07/17/2019.    Current Diagnosis/Assessment:  SDOH Interventions     Most Recent Value  SDOH Interventions  SDOH Interventions for the Following Domains  Financial Strain  Financial Strain Interventions  Other (Comment) [[employer] assistance for Victoza, and ozempic samples provided]     Goals Addressed            This Visit's Progress   . Pharmacy Care Plan       CARE PLAN ENTRY  Current Barriers:  . Chronic Disease Management support, education, and care  coordination needs related to Hypertension, Hyperlipidemia, Diabetes, Atrial Fibrillation, and Heart Failure   Hypertension/Heart Failure . Pharmacist Clinical Goal(s): o Over the next 60 days, patient will work with PharmD and providers to maintain BP goal <130/80 . Current regimen:  o metoprolol succinate 100 mg BID,  o telmisartan 40 mg daily,  o spironolactone 25 mg daily,  o furosemide 20 mg daily,  o potassium chloride 20 meq BID,  o nitroglycerin . Interventions: o Discussed medications at length and how they work together to achieve BP goals o Discussed benefits of maintaining BP at goal . Patient self care activities - Over the next 60 days, patient will: o Ensure daily salt intake < 2300 mg/day  Hyperlipidemia Lipid Panel     Component Value Date/Time   CHOL 126 04/11/2019 1455   TRIG 123.0 04/11/2019 1455   HDL 36.70 (L) 04/11/2019 1455  [place] 65 04/11/2019 1455 .  Pharmacist Clinical Goal(s): o Over the next 60 days, patient will work with PharmD and providers to maintain LDL goal < 70 . Current regimen:  o Atorvastatin 40 mg daily . Interventions: o Discussed dietary recommendations to maintain LDL goals, including cutting back on fried foods . Patient self care activities - Over the next 60 days, patient will: o Reduce fried fish in diet  Diabetes . Pharmacist Clinical Goal(s): o Over the next 60 days, patient will work with PharmD and providers to achieve A1c goal <8% . Current regimen:  o Jardiance 25 mg daily (via PAP) o glipizide 10 mg (5 mg x 2) BID,  o metformin ER 1000 mg BID,  o Toujeo Max 50 units daily (via PAP) o Victoza 1.8 mg daily (via PAP) . Interventions: o Pursue patient assistance for Victoza o Provide samples of Ozempic as needed until PAP approval for Victoza . Patient self care activities - Over the next 60 days, patient will: o Check blood sugar twice daily, in the morning before eating or drinking, and at bedtime, document, and  provide at future appointments o Contact provider with any episodes of hypoglycemia  Atrial Fibrillation . Pharmacist Clinical Goal(s) o Over the next 60 days, patient will work with PharmD and providers to optimize anticoagulation regimen . Current regimen:  o metoprolol succinate 100 mg BID,  o warfarin 3 mg AD . Interventions: o Discussed Lovenox bridge before shoulder surgery (TBD) - Lovenox is non-preferred drug with insurance but during coverage gap patient receives 75% discount on generic drugs, bringing price to $30-40 . Patient self care activities - Over the next 60 days, patient will: o Continue to follow with warfarin clinic for INR checks and dose changes o Follow surgical and warfarin clinic nurse instructions regarding Lovenox bridging for possible shoulder surgery o Inform pharmacist and providers of medication or diet changes o Contact pharmacist with any medication access issues  Medication management . Pharmacist Clinical Goal(s): o Over the next 60 days, patient will work with PharmD and providers to maintain optimal medication adherence . Current pharmacy: [facility] . Interventions o Comprehensive medication review performed. o Utilize [facility] pharmacy for medication synchronization, packaging and delivery . Patient self care activities - Over the next 60 days, patient will: o Focus on medication adherence by pill box o Take medications as prescribed o Report any questions or concerns to PharmD and/or provider(s)  Initial goal documentation        Diabetes   Recent Relevant Labs: Lab Results  Component Value Date/Time   HGBA1C 9.0 (A) 04/11/2019 02:39 PM   HGBA1C 9.0 (A) 12/19/2018 04:44 PM   HGBA1C 8.4 (H) 08/07/2017 02:52 PM   HGBA1C 8.0 (H) 05/25/2017 02:36 PM   MICROALBUR <0.7 04/11/2019 02:55 PM   MICROALBUR <0.7 12/28/2017 03:51 PM    Kidney Function Lab Results  Component Value Date/Time   CREATININE 0.86 04/11/2019 02:55 PM   CREATININE  1.01 12/19/2018 05:02 PM   CREATININE 0.80 09/01/2015 01:44 PM   GFR 78.64 04/11/2019 02:55 PM   GFRAA 77 10/11/2016 08:36 AM   GFRAA 68 04/09/2015 08:42 AM   Checking BG: 2x per Day  Recent FBG Readings: 80-130 Recent 2hr PP BG readings: 180-190  Patient has failed these meds in past: n/a Patient is currently uncontrolled on the following medications:   Jardiance 25 mg daily (via PAP)  glipizide 10 mg (5 mg x 2) BID,   metformin ER 1000 mg BID,  Toujeo Max 50 units daily (via PAP, $35/month w/ ins plan)  Victoza 1.8 mg daily (via PAP)  Last diabetic Foot exam:  Lab Results  Component Value Date/Time   HMDIABEYEEXA No Retinopathy 03/21/2019 12:00 AM    Last diabetic Eye exam:  Lab Results  Component Value Date/Time   HMDIABFOOTEX normal 04/02/2014 12:00 AM     We discussed: previously we have discussed dietary indiscretions with sweets, fried fish. Today we are working on [employer] PAP approval [employer] requesting additional information per patient. Will contact [employer] to resolve issue. Pt is using Ozempic samples in the meantime since Victoza would have been very expensive in donut hole to refill. She is out of samples and requesting another, will check supply and provide for patient as needed.  Plan  Continue current medications and control with diet and exercise  Resolve [employer] PAP Ozempic samples prn until PAP approval   Heart Failure/HTN   Office blood pressures are  BP Readings from Last 3 Encounters:  05/08/19 124/76  05/03/19 126/76  04/18/19 122/78   Patient checks BP at home: no  Patient has failed these meds in past: n/a Patient is currently controlled on the following medications:   metoprolol succinate 100 mg BID,   telmisartan 40 mg daily,   spironolactone 25 mg daily,   furosemide 20 mg daily,   potassium chloride 20 meq BID,   nitroglycerin  We discussed diet and exercise extensively; pt denies issues with meds, denies  shortness of breath or excessive leg swelling.  Plan  Continue current medications and control with diet and exercise   AFIB   Patient is currently rate controlled.  Patient has failed these meds in past: n/a Patient is currently controlled on the following medications:   metoprolol succinate 100 mg BID,   warfarin 3 mg AD  We discussed:  Pt is thinking about shoulder surgery, she has spoken with surgeon and he plans to stop warfarin 5 days in advance and bridge with lovenox. Pt is worried about cost, per her insurance plan enoxaparin is non-preferred drug, but during coverage gap she should get 75% off so price should be ~$30-40, which pt says is reasonable. She has not scheduled surgery yet as she is still deciding if she really needs it.  Plan  Continue current medications Patient will contact CPP if she pursues shoulder surgery and needs help with Lovenox bridge   Hyperlipidemia/CAD   Lipid Panel     Component Value Date/Time   CHOL 126 04/11/2019 1455   CHOL 108 12/26/2017 0758   TRIG 123.0 04/11/2019 1455   HDL 36.70 (L) 04/11/2019 1455   HDL 38 (L) 12/26/2017 0758   CHOLHDL 3 04/11/2019 1455   VLDL 24.6 04/11/2019 1455   LDLCALC 65 04/11/2019 1455   LDLCALC 48 12/26/2017 0758   LABVLDL 22 12/26/2017 0758    Patient has failed these meds in past: n/a Patient is currently controlled on the following medications:   atorvastatin 40 mg daily  We discussed:  diet and exercise extensively; pt reports eating fried fish 3x per week, discussed effects on cholesterol, pt agreed to cut back.  Plan  Continue current medications and control with diet and exercise   Fibromyalgia/chronic pain   Patient has failed these meds in past: n/a Patient is currently controlled on the following medications:   cyclobenzaprine 10 mg TID,   gabapentin 300 mg TID,   Percocet 10-325 mg   We discussed:  Pain prevents her  from exercising, meds make it  bearable.  Plan  Continue current medications    Anxiety/isomnia   Patient has failed these meds in past: n/a Patient is currently controlled on the following medications:   alprazolam 1 mg TID prn  We discussed:  Pt has been on alprazolam for ~20 years, she is worried about withdrawal if she were to stop.  Plan  Continue current medications    GERD   Patient has failed these meds in past: n/a Patient is currently controlled on the following medications:   famotidine 20 mg BID  We discussed:  Pt did not report side effects  Plan  Continue current medications   Health Maintenance   Patient is currently controlled on the following medications:   Vitamin C,   Vitamin D3,   Magnesium oxide 400 mg BID,   multivitamin, thiamine 100 mg,   cetirizine 10 mg daily,   triamcinolone cream  We discussed:  Pt is overall satisfied with med regimen  Plan  Continue current medications   Medication Management   Pt uses [facility] for all medications Pt endorses 100% compliance   We discussed: we are working on PAP for Victoza, [employer] has asked for dose clarification. Will contact [employer] to resolve.  Pt also requested delivery of test strips, alprazolam from Dr Lenice Pressman and Percocet from Dr Hardin Negus on 07/26/19. Coordinated with pharmacy and providers to ensure delivery.   Plan  Utilize [facility] pharmacy for medication synchronization, packaging and delivery Contact [employer] for Victoza PAP approval    Follow up: 2 month phone visit  Charlene Brooke, PharmD Clinical Pharmacist [facility] [phone]

## 2019-07-18 NOTE — Patient Instructions (Addendum)
Visit Information  Goals Addressed            This Visit's Progress   . Pharmacy Care Plan       CARE PLAN ENTRY  Current Barriers:  . Chronic Disease Management support, education, and care coordination needs related to Hypertension, Hyperlipidemia, Diabetes, Atrial Fibrillation, and Heart Failure   Hypertension/Heart Failure . Pharmacist Clinical Goal(s): o Over the next 60 days, patient will work with PharmD and providers to maintain BP goal <130/80 . Current regimen:  o metoprolol succinate 100 mg BID,  o telmisartan 40 mg daily,  o spironolactone 25 mg daily,  o furosemide 20 mg daily,  o potassium chloride 20 meq BID,  o nitroglycerin . Interventions: o Discussed medications at length and how they work together to achieve BP goals o Discussed benefits of maintaining BP at goal . Patient self care activities - Over the next 60 days, patient will: o Ensure daily salt intake < 2300 mg/day  Hyperlipidemia Lipid Panel     Component Value Date/Time   CHOL 126 04/11/2019 1455   TRIG 123.0 04/11/2019 1455   HDL 36.70 (L) 04/11/2019 1455   LDLCALC 65 04/11/2019 1455 .  Pharmacist Clinical Goal(s): o Over the next 60 days, patient will work with PharmD and providers to maintain LDL goal < 70 . Current regimen:  o Atorvastatin 40 mg daily . Interventions: o Discussed dietary recommendations to maintain LDL goals, including cutting back on fried foods . Patient self care activities - Over the next 60 days, patient will: o Reduce fried fish in diet  Diabetes . Pharmacist Clinical Goal(s): o Over the next 60 days, patient will work with PharmD and providers to achieve A1c goal <8% . Current regimen:  o Jardiance 25 mg daily (via PAP) o glipizide 10 mg (5 mg x 2) BID,  o metformin ER 1000 mg BID,  o Toujeo Max 50 units daily (via PAP) o Victoza 1.8 mg daily (via PAP) . Interventions: o Pursue patient assistance for Victoza o Provide samples of Ozempic as needed until  PAP approval for Victoza . Patient self care activities - Over the next 60 days, patient will: o Check blood sugar twice daily, in the morning before eating or drinking, and at bedtime, document, and provide at future appointments o Contact provider with any episodes of hypoglycemia  Atrial Fibrillation . Pharmacist Clinical Goal(s) o Over the next 60 days, patient will work with PharmD and providers to optimize anticoagulation regimen . Current regimen:  o metoprolol succinate 100 mg BID,  o warfarin 3 mg AD . Interventions: o Discussed Lovenox bridge before shoulder surgery (TBD) - Lovenox is non-preferred drug with insurance but during coverage gap patient receives 75% discount on generic drugs, bringing price to $30-40 . Patient self care activities - Over the next 60 days, patient will: o Continue to follow with warfarin clinic for INR checks and dose changes o Follow surgical and warfarin clinic nurse instructions regarding Lovenox bridging for possible shoulder surgery o Inform pharmacist and providers of medication or diet changes o Contact pharmacist with any medication access issues  Medication management . Pharmacist Clinical Goal(s): o Over the next 60 days, patient will work with PharmD and providers to maintain optimal medication adherence . Current pharmacy: [facility] . Interventions o Comprehensive medication review performed. o Utilize [facility] pharmacy for medication synchronization, packaging and delivery . Patient self care activities - Over the next 60 days, patient will: o Focus on medication adherence by pill box  o Take medications as prescribed o Report any questions or concerns to PharmD and/or provider(s)  Initial goal documentation        The patient verbalized understanding of instructions provided today and declined a print copy of patient instruction materials.   Telephone follow up appointment with pharmacy team member scheduled for: 2  months  Charlene Brooke, PharmD Clinical Pharmacist [facility] [phone]   Heart-Healthy Eating Plan Heart-healthy meal planning includes:  Eating less unhealthy fats.  Eating more healthy fats.  Making other changes in your diet. Talk with your doctor or a diet specialist (dietitian) to create an eating plan that is right for you. What are tips for following this plan? Cooking Avoid frying your food. Try to bake, boil, grill, or broil it instead. You can also reduce fat by:  Removing the skin from poultry.  Removing all visible fats from meats.  Steaming vegetables in water or broth. Meal planning   At meals, divide your plate into four equal parts: ? Fill one-half of your plate with vegetables and green salads. ? Fill one-fourth of your plate with whole grains. ? Fill one-fourth of your plate with lean protein foods.  Eat 4-5 servings of vegetables per day. A serving of vegetables is: ? 1 cup of raw or cooked vegetables. ? 2 cups of raw leafy greens.  Eat 4-5 servings of fruit per day. A serving of fruit is: ? 1 medium whole fruit. ?  cup of dried fruit. ?  cup of fresh, frozen, or canned fruit. ?  cup of 100% fruit juice.  Eat more foods that have soluble fiber. These are apples, broccoli, carrots, beans, peas, and barley. Try to get 20-30 g of fiber per day.  Eat 4-5 servings of nuts, legumes, and seeds per week: ? 1 serving of dried beans or legumes equals  cup after being cooked. ? 1 serving of nuts is  cup. ? 1 serving of seeds equals 1 tablespoon. General information  Eat more home-cooked food. Eat less restaurant, buffet, and fast food.  Limit or avoid alcohol.  Limit foods that are high in starch and sugar.  Avoid fried foods.  Lose weight if you are overweight.  Keep track of how much salt (sodium) you eat. This is important if you have high blood pressure. Ask your doctor to tell you more about this.  Try  to add vegetarian meals each week. Fats  Choose healthy fats. These include olive oil and canola oil, flaxseeds, walnuts, almonds, and seeds.  Eat more omega-3 fats. These include salmon, mackerel, sardines, tuna, flaxseed oil, and ground flaxseeds. Try to eat fish at least 2 times each week.  Check food labels. Avoid foods with trans fats or high amounts of saturated fat.  Limit saturated fats. ? These are often found in animal products, such as meats, butter, and cream. ? These are also found in plant foods, such as palm oil, palm kernel oil, and coconut oil.  Avoid foods with partially hydrogenated oils in them. These have trans fats. Examples are stick margarine, some tub margarines, cookies, crackers, and other baked goods. What foods can I eat? Fruits All fresh, canned (in natural juice), or frozen fruits. Vegetables Fresh or frozen vegetables (raw, steamed, roasted, or grilled). Green salads. Grains Most grains. Choose whole wheat and whole grains most of the time. Rice and pasta, including brown rice and pastas made with whole wheat. Meats and other proteins Lean, well-trimmed beef, veal, pork, and  lamb. Chicken and [place] without skin. All fish and shellfish. Wild duck, rabbit, pheasant, and venison. Egg whites or low-cholesterol egg substitutes. Dried beans, peas, lentils, and tofu. Seeds and most nuts. Dairy Low-fat or nonfat cheeses, including ricotta and mozzarella. Skim or 1% milk that is liquid, powdered, or evaporated. Buttermilk that is made with low-fat milk. Nonfat or low-fat yogurt. Fats and oils Non-hydrogenated (trans-free) margarines. Vegetable oils, including soybean, sesame, sunflower, olive, peanut, safflower, corn, canola, and cottonseed. Salad dressings or mayonnaise made with a vegetable oil. Beverages Mineral water. Coffee and tea. Diet carbonated beverages. Sweets and desserts Sherbet, gelatin, and fruit ice. Small amounts of dark chocolate. Limit all  sweets and desserts. Seasonings and condiments All seasonings and condiments. The items listed above may not be a complete list of foods and drinks you can eat. Contact a dietitian for more options. What foods should I avoid? Fruits Canned fruit in heavy syrup. Fruit in cream or butter sauce. Fried fruit. Limit coconut. Vegetables Vegetables cooked in cheese, cream, or butter sauce. Fried vegetables. Grains Breads that are made with saturated or trans fats, oils, or whole milk. Croissants. Sweet rolls. Donuts. High-fat crackers, such as cheese crackers. Meats and other proteins Fatty meats, such as hot dogs, ribs, sausage, bacon, rib-eye roast or steak. High-fat deli meats, such as salami and bologna. Caviar. Domestic duck and goose. Organ meats, such as liver. Dairy Cream, sour cream, cream cheese, and creamed cottage cheese. Whole-milk cheeses. Whole or 2% milk that is liquid, evaporated, or condensed. Whole buttermilk. Cream sauce or high-fat cheese sauce. Yogurt that is made from whole milk. Fats and oils Meat fat, or shortening. Cocoa butter, hydrogenated oils, palm oil, coconut oil, palm kernel oil. Solid fats and shortenings, including bacon fat, salt pork, lard, and butter. Nondairy cream substitutes. Salad dressings with cheese or sour cream. Beverages Regular sodas and juice drinks with added sugar. Sweets and desserts Frosting. Pudding. Cookies. Cakes. Pies. Milk chocolate or white chocolate. Buttered syrups. Full-fat ice cream or ice cream drinks. The items listed above may not be a complete list of foods and drinks to avoid. Contact a dietitian for more information. Summary  Heart-healthy meal planning includes eating less unhealthy fats, eating more healthy fats, and making other changes in your diet.  Eat a balanced diet. This includes fruits and vegetables, low-fat or nonfat dairy, lean protein, nuts and legumes, whole grains, and heart-healthy oils and fats. This  information is not intended to replace advice given to you by your health care provider. Make sure you discuss any questions you have with your health care provider. Document Revised: 04/27/2017 Document Reviewed: 03/31/2017 Elsevier Patient Education  2020 [employer].

## 2019-07-22 ENCOUNTER — Ambulatory Visit: Payer: Medicare Other | Admitting: Cardiovascular Disease

## 2019-07-29 ENCOUNTER — Encounter: Payer: Self-pay | Admitting: *Deleted

## 2019-07-29 ENCOUNTER — Other Ambulatory Visit: Payer: Self-pay | Admitting: *Deleted

## 2019-07-29 NOTE — Patient Outreach (Signed)
[facility]) Care Management  07/29/2019  Kristen [name] Mar 03, 1946 [id number]   RN Health Coach Monthly Outreach  Referral Date:01/02/2018 Referral Source:MD Referral Screening Reason for Referral:Disease Management Education Insurance:[employer] Medicare   Outreach Attempt:  Successful telephone outreach to patient for follow up. HIPAA verified with patient.  Patient reporting an increase in weight but feels it is not fluid.  Weight increased to 220 pounds over the past few weeks.  Reports shortness of breath is the same with exertion and thigh swelling is related to knee injury.  Feels increase in weight is related to decrease in activity due to the pandemic.  Wants to become more active.  Encouraged patient increase activity as tolerated and to discuss exercise regimen with provider.  Reporting she has sample of Ozempic from provider office and is awaiting medication assistance for Victoza.  Fasting blood sugar this morning was 138 with recent fasting ranges of 130's.  Denies any significant hypoglycemia.  Appointments:  Last attended appointment with primary care provider on 05/08/2019.  Encouraged to schedule follow up appointment with primary care.  Plan: RN Health Coach will send EMMI Core Strengthening Exercises on Back or on Hands and Knees. RN Health Coach will send EMMI Core Strengthening Exercises on Stomach or Side. RN Health Coach will make next telephone outreach to patient within the month of June and patient agrees to future outreach.   [place] [phone] [email or web address]

## 2019-08-07 DIAGNOSIS — G894 Chronic pain syndrome: Secondary | ICD-10-CM | POA: Diagnosis not present

## 2019-08-07 DIAGNOSIS — M25512 Pain in left shoulder: Secondary | ICD-10-CM | POA: Diagnosis not present

## 2019-08-07 DIAGNOSIS — Z79891 Long term (current) use of opiate analgesic: Secondary | ICD-10-CM | POA: Diagnosis not present

## 2019-08-07 DIAGNOSIS — M150 Primary generalized (osteo)arthritis: Secondary | ICD-10-CM | POA: Diagnosis not present

## 2019-08-08 ENCOUNTER — Ambulatory Visit (INDEPENDENT_AMBULATORY_CARE_PROVIDER_SITE_OTHER): Payer: Medicare Other | Admitting: General Practice

## 2019-08-08 ENCOUNTER — Other Ambulatory Visit: Payer: Self-pay

## 2019-08-08 ENCOUNTER — Other Ambulatory Visit: Payer: Self-pay | Admitting: Internal Medicine

## 2019-08-08 DIAGNOSIS — Z7901 Long term (current) use of anticoagulants: Secondary | ICD-10-CM

## 2019-08-08 DIAGNOSIS — E538 Deficiency of other specified B group vitamins: Secondary | ICD-10-CM | POA: Diagnosis not present

## 2019-08-08 DIAGNOSIS — I480 Paroxysmal atrial fibrillation: Secondary | ICD-10-CM

## 2019-08-08 LAB — POCT INR: INR: 2.8 (ref 2.0–3.0)

## 2019-08-08 MED ORDER — CYANOCOBALAMIN 1000 MCG/ML IJ SOLN
1000.0000 ug | Freq: Once | INTRAMUSCULAR | Status: AC
  Administered 2019-08-08: 1000 ug via INTRAMUSCULAR

## 2019-08-08 NOTE — Progress Notes (Signed)
I have reviewed and agree.

## 2019-08-08 NOTE — Patient Instructions (Addendum)
Pre visit review using our clinic review tool, if applicable. No additional management support is needed unless otherwise documented below in the visit note.  Continue to take 1 tablet daily except 2 tablets on Mon and Fridays. Re-check in 6 weeks.  

## 2019-08-08 NOTE — Progress Notes (Signed)
Please sign B-12 injection

## 2019-08-27 ENCOUNTER — Other Ambulatory Visit: Payer: Self-pay | Admitting: *Deleted

## 2019-08-27 NOTE — Patient Outreach (Signed)
[facility]) Care Management  08/27/2019  Jewel Mcafee Apr 16, 1945 [id number]   RN Health Coach Monthly Outreach  Referral Date:01/02/2018 Referral Source:MD Referral Screening Reason for Referral:Disease Management Education Insurance:[employer] Medicare   Outreach Attempt:  Outreach attempt #1 to patient for follow up.  Patient answered and stated she is expecting an important telephone call and requested call back another day and time.   Plan:  RN Health Coach will make another outreach attempt to patient within the month of June per patient's request.  Hubert Azure RN [place] [phone] [email or web address]

## 2019-08-28 ENCOUNTER — Telehealth: Payer: Self-pay

## 2019-08-28 NOTE — Telephone Encounter (Signed)
Request for refill has been faxed back

## 2019-08-28 NOTE — Telephone Encounter (Signed)
Received fax from [facility] to refill the [employer].   Form completed and given to PCP to sign and fax back.

## 2019-09-04 ENCOUNTER — Other Ambulatory Visit: Payer: Self-pay | Admitting: *Deleted

## 2019-09-04 DIAGNOSIS — M47816 Spondylosis without myelopathy or radiculopathy, lumbar region: Secondary | ICD-10-CM | POA: Diagnosis not present

## 2019-09-04 DIAGNOSIS — G894 Chronic pain syndrome: Secondary | ICD-10-CM | POA: Diagnosis not present

## 2019-09-04 DIAGNOSIS — M150 Primary generalized (osteo)arthritis: Secondary | ICD-10-CM | POA: Diagnosis not present

## 2019-09-04 DIAGNOSIS — Z79891 Long term (current) use of opiate analgesic: Secondary | ICD-10-CM | POA: Diagnosis not present

## 2019-09-04 NOTE — Patient Outreach (Signed)
[place]) Care Management  09/04/2019  Janese Radabaugh 03-27-1945 [id number]   RN Health Coach Monthly Outreach  Referral Date:01/02/2018 Referral Source:MD Referral Screening Reason for Referral:Disease Management Education Insurance:[employer] Medicare   Outreach Attempt:  Outreach attempt #2 to patient for follow up. No answer. RN Health Coach left HIPAA compliant voicemail message along with contact information.  Plan:  RN Health Coach will make another outreach attempt within the month of July if no return call back from patient.   [place] [phone] [email or web address]

## 2019-09-13 ENCOUNTER — Other Ambulatory Visit: Payer: Self-pay | Admitting: Internal Medicine

## 2019-09-13 ENCOUNTER — Other Ambulatory Visit: Payer: Self-pay | Admitting: Cardiovascular Disease

## 2019-09-13 DIAGNOSIS — E1151 Type 2 diabetes mellitus with diabetic peripheral angiopathy without gangrene: Secondary | ICD-10-CM

## 2019-09-13 DIAGNOSIS — Z7901 Long term (current) use of anticoagulants: Secondary | ICD-10-CM

## 2019-09-17 ENCOUNTER — Ambulatory Visit: Payer: Medicare Other | Admitting: Pharmacist

## 2019-09-17 ENCOUNTER — Other Ambulatory Visit: Payer: Self-pay

## 2019-09-17 DIAGNOSIS — I5042 Chronic combined systolic (congestive) and diastolic (congestive) heart failure: Secondary | ICD-10-CM

## 2019-09-17 DIAGNOSIS — I480 Paroxysmal atrial fibrillation: Secondary | ICD-10-CM

## 2019-09-17 DIAGNOSIS — E1159 Type 2 diabetes mellitus with other circulatory complications: Secondary | ICD-10-CM

## 2019-09-17 DIAGNOSIS — E1169 Type 2 diabetes mellitus with other specified complication: Secondary | ICD-10-CM

## 2019-09-17 DIAGNOSIS — Z794 Long term (current) use of insulin: Secondary | ICD-10-CM

## 2019-09-17 NOTE — Chronic Care Management (AMB) (Signed)
Chronic Care Management Pharmacy  Name: Kristen [name]  MRN: [id number] DOB: 1946/01/31    Chief Complaint/ HPI  Kristen [name],  74 y.o. , female presents for their Follow-Up CCM visit with the clinical pharmacist via telephone due to COVID-19 Pandemic.  PCP : [name], MD  Patient Care Team: [name], MD as PCP - General (Internal Medicine) Sherren Mocha, MD as PCP - Cardiology (Cardiology) Deboraha Sprang, MD as Consulting Physician (Cardiology) Leona Singleton, RN as [facility], [facility] as Pharmacist (Pharmacist)  Their chronic conditions include: T2DM, HTN, Afib, CHF, CAD, GERD, HLD, fibromyalgia, insomnia  COVID vaccine allergic reaction. Home bound since last April. Lives with husband. Retired 2005 after injury at work.   Office Visits: 05/08/19 Mickel Baas FNP: allergic reaction to COVID vaccine f/u.  05/06/19 Pt message: requesting 30 day supply of Toujeo d/t cost 04/11/19 Dr Ronnald Ramp OV: wt gain, poor med compliance. Rec'd to stop glipizide and be more compliant with Victoza, SGLT-2 and insulin. Follows with cardiology for CHF.  Consult Visit:  03/12/19 Dr Casimiro Needle (psychiatry): very stable, no med changes.  Allergies  Allergen Reactions  . Metformin And Related Other (See Comments)    Must take XR form only, cannot tolerate Regular release metformin  . Cleocin [Clindamycin Hcl] Diarrhea  . Codeine Itching  . Doxycycline Hyclate Diarrhea  . Macrolides And Ketolides Diarrhea  . Morphine Hives  . Pentazocine Lactate Itching and Nausea And Vomiting    (GENERIC- Talwin)  . Vibramycin [Doxycycline Calcium] Diarrhea and Rash  . Definity [Perflutren Lipid Microsphere] Other (See Comments)    Patient complained of warm sensation in chest x few seconds duration when injected Definity.No other symptoms  . Sulfonamide Derivatives Rash   Medications: Outpatient Encounter Medications as of 09/17/2019  Medication  Sig  . ALPRAZolam (XANAX) 1 MG tablet Take 1 tablet (1 mg total) by mouth 3 (three) times daily as needed for anxiety.  . Ascorbic Acid (VITAMIN C) 1000 MG tablet Take 1,000 mg by mouth daily.  Marland Kitchen atorvastatin (LIPITOR) 40 MG tablet TAKE 1 TABLET(40 MG) BY MOUTH DAILY  . Blood Glucose Monitoring Suppl (ONE TOUCH ULTRA 2) w/Device KIT Use as advised  . cephALEXin (KEFLEX) 500 MG capsule Take 1 capsule (500 mg total) by mouth 3 (three) times daily.  . cetirizine (ZYRTEC) 10 MG tablet Take 1 tablet (10 mg total) by mouth at bedtime.  . Cholecalciferol (VITAMIN D3) 50 MCG (2000 UT) capsule Take 2,000 Units by mouth daily.  . cyclobenzaprine (FLEXERIL) 10 MG tablet Take 10 mg by mouth 3 (three) times daily as needed.   . empagliflozin (JARDIANCE) 25 MG TABS tablet Take 25 mg by mouth daily.  Marland Kitchen EPINEPHrine 0.3 mg/0.3 mL IJ SOAJ injection Inject 0.3 mLs (0.3 mg total) into the muscle as needed for anaphylaxis.  . famotidine (PEPCID) 20 MG tablet Take 1 tablet (20 mg total) by mouth 2 (two) times daily.  . fluticasone (FLONASE) 50 MCG/ACT nasal spray Place 1 spray into both nostrils daily as needed for allergies or rhinitis.  . furosemide (LASIX) 20 MG tablet Take 1 tablet (20 mg total) by mouth daily.  Marland Kitchen gabapentin (NEURONTIN) 300 MG capsule Take 1 capsule (300 mg total) by mouth 3 (three) times daily.  Marland Kitchen glucose blood (COOL BLOOD GLUCOSE TEST STRIPS) test strip Use to test blood sugar twice daily. DX: E11.9  . insulin glargine, 2 Unit Dial, (TOUJEO MAX SOLOSTAR) 300 UNIT/ML Solostar Pen  Inject 50 Units into the skin daily.  . Insulin Pen Needle (NOVOFINE) 32G X 6 MM MISC 1 Act by Does not apply route daily.  . Lancets (ONETOUCH ULTRASOFT) lancets Use as instructed  . liraglutide (VICTOZA) 18 MG/3ML SOPN Inject 0.3 mLs (1.8 mg total) into the skin every morning.  . magnesium oxide (MAG-OX) 400 MG tablet TAKE ONE TABLET BY MOUTH BY MOUTH TWICE DAILY WITH BREAKFAST AND EVENING MEAL]  . metFORMIN  (GLUCOPHAGE-XR) 500 MG 24 hr tablet Take 2 tablets (1,000 mg total) by mouth 2 (two) times daily with a meal.  . metoprolol succinate (TOPROL-XL) 100 MG 24 hr tablet Take 1 tablet (100 mg total) by mouth 2 (two) times daily. Take with or immediately following a meal.  . Multiple Vitamin (MULTIVITAMIN) tablet Take 1 tablet by mouth daily.  . nitroGLYCERIN (NITROSTAT) 0.4 MG SL tablet Place 1 tablet (0.4 mg total) under the tongue every 5 (five) minutes as needed for chest pain (x 3 doses). Reported on 05/08/2015  . ondansetron (ZOFRAN-ODT) 4 MG disintegrating tablet Take 1 tablet (4 mg total) by mouth every 8 (eight) hours as needed for nausea or vomiting.  Marland Kitchen PERCOCET 10-325 MG tablet Take 1 tablet by mouth 5 (five) times daily as needed.  . potassium chloride SA (KLOR-CON) 20 MEQ tablet Take 1 tablet (20 mEq total) by mouth 2 (two) times daily.  Marland Kitchen spironolactone (ALDACTONE) 25 MG tablet Take 1 tablet (25 mg total) by mouth 2 (two) times daily. (Patient taking differently: Take 25 mg by mouth daily. )  . telmisartan (MICARDIS) 40 MG tablet Take 1 tablet (40 mg total) by mouth daily.  Marland Kitchen thiamine 100 MG tablet Take 1 tablet (100 mg total) by mouth daily.  Marland Kitchen triamcinolone cream (KENALOG) 0.5 % Apply 1 application topically 3 (three) times daily.  Marland Kitchen warfarin (COUMADIN) 3 MG tablet Take 1 tablet daily or As directed by anticoagulation clinic  . [DISCONTINUED] atorvastatin (LIPITOR) 40 MG tablet Take 1 tablet (40 mg total) by mouth daily.  . [DISCONTINUED] spironolactone (ALDACTONE) 25 MG tablet Take 1 tablet (25 mg total) by mouth 2 (two) times daily.   No facility-administered encounter medications on file as of 09/17/2019.    Current Diagnosis/Assessment:  SDOH Interventions     Most Recent Value  SDOH Interventions  Financial Strain Interventions --  [PAP for Victoza, Toujeo, Jardiance]     Goals Addressed            This Visit's Progress   . Pharmacy Care Plan       CARE PLAN  ENTRY  Current Barriers:  . Chronic Disease Management support, education, and care coordination needs related to Hypertension, Hyperlipidemia, Diabetes, Atrial Fibrillation, and Heart Failure   Hypertension/Heart Failure BP Readings from Last 3 Encounters:  05/08/19 124/76  05/03/19 126/76  04/18/19 122/78 .  Pharmacist Clinical Goal(s): o Over the next 90 days, patient will work with PharmD and providers to maintain BP goal <130/80 . Current regimen:  o metoprolol succinate 100 mg twice daily  o telmisartan 40 mg daily,  o spironolactone 25 mg daily,  o furosemide 20 mg daily,  o potassium chloride 20 meq twice daily o Nitroglycerin 0.4 mg as needed . Interventions: o Discussed medications at length and how they work together to achieve BP goals o Discussed benefits of maintaining BP at goal . Patient self care activities - Over the next 90 days, patient will: o Ensure daily salt intake < 2300 mg/day  Hyperlipidemia Lab Results  Component Value Date/Time   LDLCALC 65 04/11/2019 02:55 PM   LDLCALC 48 12/26/2017 07:58 AM   LDLDIRECT 116.0 11/28/2008 11:22 AM .  Pharmacist Clinical Goal(s): o Over the next 90 days, patient will work with PharmD and providers to maintain LDL goal < 70 . Current regimen:  o Atorvastatin 40 mg daily . Interventions: o Discussed dietary recommendations to maintain LDL goals, including cutting back on fried foods . Patient self care activities - Over the next 90 days, patient will: o Reduce fried fish in diet  Diabetes Lab Results  Component Value Date/Time   HGBA1C 9.0 (A) 04/11/2019 02:39 PM   HGBA1C 9.0 (A) 12/19/2018 04:44 PM   HGBA1C 8.4 (H) 08/07/2017 02:52 PM   HGBA1C 8.0 (H) 05/25/2017 02:36 PM .  Pharmacist Clinical Goal(s): o Over the next 90 days, patient will work with PharmD and providers to achieve A1c goal <8% . Current regimen:  o Jardiance 25 mg daily (via [employer]) o glipizide 10 mg (5 mg x 2) twice daily o metformin ER  1000 mg twice daily o Toujeo Max 50 units daily (via [employer]) o Victoza 1.8 mg daily (via [employer]) . Interventions: o Patient assistance approved for all brand-name medications o Discussed risk for hypoglycemia with insulin and glipizide o Discussed benefits of decreasing carbs in diet . Patient self care activities - Over the next 90 days, patient will: o Check blood sugar twice daily, in the morning before eating or drinking, and at bedtime, document, and provide at future appointments o Contact provider with any episodes of hypoglycemia o Reduce carbohydrates - bread, pasta, rice, potatoes  Atrial Fibrillation . Pharmacist Clinical Goal(s) o Over the next 90 days, patient will work with PharmD and providers to optimize anticoagulation regimen . Current regimen:  o metoprolol succinate 100 mg BID,  o warfarin 3 mg AD . Interventions: o Discussed Lovenox bridge before shoulder surgery (TBD) - Lovenox is non-preferred drug with insurance but during coverage gap patient receives 75% discount on generic drugs, bringing price to $30-40 . Patient self care activities - Over the next 90 days, patient will: o Continue to follow with warfarin clinic for INR checks and dose changes o Follow surgical and warfarin clinic nurse instructions regarding Lovenox bridging for possible shoulder surgery o Inform pharmacist and providers of medication or diet changes o Contact pharmacist with any medication access issues  Medication management . Pharmacist Clinical Goal(s): o Over the next 90 days, patient will work with PharmD and providers to maintain optimal medication adherence . Current pharmacy: [facility] . Interventions o Comprehensive medication review performed. o Utilize [facility] pharmacy for medication synchronization, packaging and delivery . Patient self care activities - Over the next 90 days, patient will: o Focus on medication adherence by pill box o Take medications as  prescribed o Report any questions or concerns to PharmD and/or provider(s)  Please see past updates related to this goal by clicking on the "Past Updates" button in the selected goal         Diabetes   A1c goal < 7%  Recent Relevant Labs: Lab Results  Component Value Date/Time   HGBA1C 9.0 (A) 04/11/2019 02:39 PM   HGBA1C 9.0 (A) 12/19/2018 04:44 PM   HGBA1C 8.4 (H) 08/07/2017 02:52 PM   HGBA1C 8.0 (H) 05/25/2017 02:36 PM   MICROALBUR <0.7 04/11/2019 02:55 PM   MICROALBUR <0.7 12/28/2017 03:51 PM   Last diabetic Foot exam:  Lab Results  Component Value Date/Time   HMDIABEYEEXA  No Retinopathy 03/21/2019 12:00 AM    Last diabetic Eye exam:  Lab Results  Component Value Date/Time   HMDIABFOOTEX normal 04/02/2014 12:00 AM    Checking BG: 2x per Day  Recent FBG Readings: 104-160 Recent 2hr PP BG readings: 100  Patient has failed these meds in past: n/a Patient is currently uncontrolled on the following medications:   Jardiance 25 mg daily ([employer] PAP)  glipizide 10 mg (5 mg x 2) BID,   metformin ER 1000 mg BID,   Toujeo Max 50 units daily PM ([employer] PAP)  Victoza 1.8 mg daily AM ([employer] PAP)  We discussed: previously we have discussed dietary indiscretions with sweets, fried fish. Pt reports BG have been more controlled, she has long term access to branded medications now due to PAP approvals. Discussed risk for hypoglycemia with Insulin + glipizide, pt denies recent episodes of true hypoglycemia but she gets worried when BG is in low 100s at night. Discussed how to optimize diet; if BG can be better controlled with diet, may be able to d/c glipizide in near future.  Plan  Continue current medications and control with diet and exercise   Heart Failure / Hypertension   HF type: combined, LV dysfunction Last EF: 50-55% (09/02/2014)  BP goal < 130/80  BP Readings from Last 3 Encounters:  05/08/19 124/76  05/03/19 126/76  04/18/19 122/78   Kidney  Function Lab Results  Component Value Date/Time   CREATININE 0.86 04/11/2019 02:55 PM   CREATININE 1.01 12/19/2018 05:02 PM   CREATININE 0.80 09/01/2015 01:44 PM   CREATININE 0.99 04/09/2015 08:42 AM   GFR 78.64 04/11/2019 02:55 PM   GFRNONAA 67 10/11/2016 08:36 AM   GFRNONAA 59 (L) 04/09/2015 08:42 AM   GFRAA 77 10/11/2016 08:36 AM   GFRAA 68 04/09/2015 08:42 AM   K 4.1 04/11/2019 02:55 PM   K 4.2 12/19/2018 05:02 PM   Patient checks BP at home: no  Patient has failed these meds in past: n/a Patient is currently controlled on the following medications:   metoprolol succinate 100 mg BID,   telmisartan 40 mg daily,   spironolactone 25 mg daily  furosemide 20 mg daily,   potassium chloride 20 meq BID,   Nitroglycerin 0.4 mg SL prn  We discussed diet and exercise extensively; pt denies issues with meds, denies shortness of breath or excessive leg swelling.  Plan  Continue current medications and control with diet and exercise  AFIB   Patient is currently rate controlled. Pulse Readings from Last 3 Encounters:  05/08/19 84  05/03/19 92  04/18/19 93   CHA2DS2-VASc Score = 8  The patient's score is based upon: CHF History: 1 HTN History: 1 Age : 1 Diabetes History: 1 Stroke History: 2 Vascular Disease History: 1 Gender: 1   Patient has failed these meds in past: n/a Patient is currently controlled on the following medications:   metoprolol succinate 100 mg BID,   warfarin 3 mg AD  We discussed:  Pt has not had shoulder surgery yet; per surgeon she will require Lovenox bridge when she schedules it; Lovenox will be $30-40 with her insurance.   Plan  Continue current medications Patient will contact CPP if she pursues shoulder surgery and needs help with Lovenox bridge  Hyperlipidemia / CAD   CABG 2004. Stroke 2004. Cath 2014 showed stable CAD. LDL goal < 70  Lipid Panel     Component Value Date/Time   CHOL 126 04/11/2019 1455   CHOL  108  12/26/2017 0758   TRIG 123.0 04/11/2019 1455   HDL 36.70 (L) 04/11/2019 1455   HDL 38 (L) 12/26/2017 0758   CHOLHDL 3 04/11/2019 1455   VLDL 24.6 04/11/2019 1455   LDLCALC 65 04/11/2019 1455   LDLCALC 48 12/26/2017 0758   LDLDIRECT 116.0 11/28/2008 1122   LABVLDL 22 12/26/2017 0758   Hepatic Function Latest Ref Rng & Units 04/11/2019 12/19/2018 12/28/2017  Total Protein 6.0 - 8.3 g/dL 8.0 7.8 7.8  Albumin 3.5 - 5.2 g/dL 4.4 4.9 4.4  AST 0 - 37 U/L '12 15 8  '$ ALT 0 - 35 U/L '13 12 10  '$ Alk Phosphatase 39 - 117 U/L 53 47 62  Total Bilirubin 0.2 - 1.2 mg/dL 0.4 1.5(H) 0.8  Bilirubin, Direct 0.0 - 0.3 mg/dL 0.1 0.3 -   The ASCVD Risk score Mikey Bussing DC Jr., et al., 2013) failed to calculate for the following reasons:   The valid total cholesterol range is 130 to 320 mg/dL  Patient has failed these meds in past: n/a Patient is currently controlled on the following medications:   atorvastatin 40 mg daily  We discussed:  diet and exercise extensively; pt reports eating fried fish 3x per week, discussed effects on cholesterol, pt agreed to cut back.  Plan  Continue current medications and control with diet and exercise   Fibromyalgia / chronic pain   Arthritis/ Back pain  Patient has failed these meds in past: n/a Patient is currently controlled on the following medications:   cyclobenzaprine 10 mg TID,   gabapentin 300 mg TID,   Percocet 10-325 mg   We discussed:  Pain prevents her from exercising, meds make it bearable.  Plan  Continue current medications    Anxiety/isomnia   Patient has failed these meds in past: n/a Patient is currently controlled on the following medications:   alprazolam 1 mg TID prn  We discussed:  Pt has been on alprazolam for ~20 years, she is worried about withdrawal if she were to stop.  Plan  Continue current medications    Health Maintenance   Patient is currently controlled on the following medications:   Vitamin C  Vitamin D3    Magnesium oxide 400 mg BID,   multivitamin,   thiamine 100 mg,   cetirizine 10 mg daily,   triamcinolone cream  Zinc 50 mg   We discussed:  Patient is satisfied with current OTC regimen and denies issues  Plan  Continue current medications   Medication Management   Pt uses [facility] pharmacy for all medications 90-day vials Pt endorses 100% compliance   We discussed: Reviewed patient's [facility] medication and Epic medication profile assuring there are no discrepancies or gaps in therapy. Confirmed all fill dates appropriate and verified with patient that there is a sufficient quantity of all prescribed medications at home. Informed patient to call me any time if needing medications before scheduled deliveries. The anticipated medication sync date is 09/24/19.  Pt reports she still has ~1 month worth of maintenance medications from her previous pharmacy. She has asked that we push her sync date back a month so she can use up her current supply.   Plan  Utilize [facility] pharmacy for medication synchronization, packaging and delivery    Follow up: 3 month phone visit  Charlene Brooke, PharmD Clinical Pharmacist [facility] [phone]

## 2019-09-17 NOTE — Patient Instructions (Addendum)
Visit Information  Phone number for Pharmacist: [phone]  Goals Addressed            This Visit's Progress   . Pharmacy Care Plan       CARE PLAN ENTRY  Current Barriers:  . Chronic Disease Management support, education, and care coordination needs related to Hypertension, Hyperlipidemia, Diabetes, Atrial Fibrillation, and Heart Failure   Hypertension/Heart Failure BP Readings from Last 3 Encounters:  05/08/19 124/76  05/03/19 126/76  04/18/19 122/78 .  Pharmacist Clinical Goal(s): o Over the next 90 days, patient will work with PharmD and providers to maintain BP goal <130/80 . Current regimen:  o metoprolol succinate 100 mg twice daily  o telmisartan 40 mg daily,  o spironolactone 25 mg daily,  o furosemide 20 mg daily,  o potassium chloride 20 meq twice daily o Nitroglycerin 0.4 mg as needed . Interventions: o Discussed medications at length and how they work together to achieve BP goals o Discussed benefits of maintaining BP at goal . Patient self care activities - Over the next 90 days, patient will: o Ensure daily salt intake < 2300 mg/day  Hyperlipidemia Lab Results  Component Value Date/Time   LDLCALC 65 04/11/2019 02:55 PM   [place] 48 12/26/2017 07:58 AM   LDLDIRECT 116.0 11/28/2008 11:22 AM .  Pharmacist Clinical Goal(s): o Over the next 90 days, patient will work with PharmD and providers to maintain LDL goal < 70 . Current regimen:  o Atorvastatin 40 mg daily . Interventions: o Discussed dietary recommendations to maintain LDL goals, including cutting back on fried foods . Patient self care activities - Over the next 90 days, patient will: o Reduce fried fish in diet  Diabetes Lab Results  Component Value Date/Time   HGBA1C 9.0 (A) 04/11/2019 02:39 PM   HGBA1C 9.0 (A) 12/19/2018 04:44 PM   HGBA1C 8.4 (H) 08/07/2017 02:52 PM   HGBA1C 8.0 (H) 05/25/2017 02:36 PM .  Pharmacist Clinical Goal(s): o Over the next 90 days, patient will work with  PharmD and providers to achieve A1c goal <8% . Current regimen:  o Jardiance 25 mg daily (via [employer]) o glipizide 10 mg (5 mg x 2) twice daily o metformin ER 1000 mg twice daily o Toujeo Max 50 units daily (via [employer]) o Victoza 1.8 mg daily (via [employer]) . Interventions: o Patient assistance approved for all brand-name medications o Discussed risk for hypoglycemia with insulin and glipizide o Discussed benefits of decreasing carbs in diet . Patient self care activities - Over the next 90 days, patient will: o Check blood sugar twice daily, in the morning before eating or drinking, and at bedtime, document, and provide at future appointments o Contact provider with any episodes of hypoglycemia o Reduce carbohydrates - bread, pasta, rice, potatoes  Atrial Fibrillation . Pharmacist Clinical Goal(s) o Over the next 90 days, patient will work with PharmD and providers to optimize anticoagulation regimen . Current regimen:  o metoprolol succinate 100 mg BID,  o warfarin 3 mg AD . Interventions: o Discussed Lovenox bridge before shoulder surgery (TBD) - Lovenox is non-preferred drug with insurance but during coverage gap patient receives 75% discount on generic drugs, bringing price to $30-40 . Patient self care activities - Over the next 90 days, patient will: o Continue to follow with warfarin clinic for INR checks and dose changes o Follow surgical and warfarin clinic nurse instructions regarding Lovenox bridging for possible shoulder surgery o Inform pharmacist and providers of medication or diet  changes o Contact pharmacist with any medication access issues  Medication management . Pharmacist Clinical Goal(s): o Over the next 90 days, patient will work with PharmD and providers to maintain optimal medication adherence . Current pharmacy: [facility] . Interventions o Comprehensive medication review performed. o Utilize [facility] pharmacy for medication synchronization, packaging  and delivery . Patient self care activities - Over the next 90 days, patient will: o Focus on medication adherence by pill box o Take medications as prescribed o Report any questions or concerns to PharmD and/or provider(s)  Please see past updates related to this goal by clicking on the "Past Updates" button in the selected goal        The patient verbalized understanding of instructions provided today and agreed to receive a mailed copy of patient instruction and/or educational materials.  Telephone follow up appointment with pharmacy team member scheduled for: 3 months  Charlene Brooke, PharmD Clinical Pharmacist [facility] [phone]  Carbohydrate Counting for Diabetes Mellitus, Adult  Carbohydrate counting is a method of keeping track of how many carbohydrates you eat. Eating carbohydrates naturally increases the amount of sugar (glucose) in the blood. Counting how many carbohydrates you eat helps keep your blood glucose within normal limits, which helps you manage your diabetes (diabetes mellitus). It is important to know how many carbohydrates you can safely have in each meal. This is different for every person. A diet and nutrition specialist (registered dietitian) can help you make a meal plan and calculate how many carbohydrates you should have at each meal and snack. Carbohydrates are found in the following foods:  Grains, such as breads and cereals.  Dried beans and soy products.  Starchy vegetables, such as potatoes, peas, and corn.  Fruit and fruit juices.  Milk and yogurt.  Sweets and snack foods, such as cake, cookies, candy, chips, and soft drinks. How do I count carbohydrates? There are two ways to count carbohydrates in food. You can use either of the methods or a combination of both. Reading "Nutrition Facts" on packaged food The "Nutrition Facts" list is included on the labels of almost all packaged foods and beverages in the  [place] It includes:  The serving size.  Information about nutrients in each serving, including the grams (g) of carbohydrate per serving. To use the "Nutrition Facts":  Decide how many servings you will have.  Multiply the number of servings by the number of carbohydrates per serving.  The resulting number is the total amount of carbohydrates that you will be having. Learning standard serving sizes of other foods When you eat carbohydrate foods that are not packaged or do not include "Nutrition Facts" on the label, you need to measure the servings in order to count the amount of carbohydrates:  Measure the foods that you will eat with a food scale or measuring cup, if needed.  Decide how many standard-size servings you will eat.  Multiply the number of servings by 15. Most carbohydrate-rich foods have about 15 g of carbohydrates per serving. ? For example, if you eat 8 oz (170 g) of strawberries, you will have eaten 2 servings and 30 g of carbohydrates (2 servings x 15 g = 30 g).  For foods that have more than one food mixed, such as soups and casseroles, you must count the carbohydrates in each food that is included. The following list contains standard serving sizes of common carbohydrate-rich foods. Each of these servings has about 15 g of carbohydrates:  hamburger bun or  English muffin.   oz (15 mL) syrup.   oz (14 g) jelly.  1 slice of bread.  1 six-inch tortilla.  3 oz (85 g) cooked rice or pasta.  4 oz (113 g) cooked dried beans.  4 oz (113 g) starchy vegetable, such as peas, corn, or potatoes.  4 oz (113 g) hot cereal.  4 oz (113 g) mashed potatoes or  of a large baked potato.  4 oz (113 g) canned or frozen fruit.  4 oz (120 mL) fruit juice.  4-6 crackers.  6 chicken nuggets.  6 oz (170 g) unsweetened dry cereal.  6 oz (170 g) plain fat-free yogurt or yogurt sweetened with artificial sweeteners.  8 oz (240 mL) milk.  8 oz (170 g) fresh fruit or  one small piece of fruit.  24 oz (680 g) popped popcorn. Example of carbohydrate counting Sample meal  3 oz (85 g) chicken breast.  6 oz (170 g) brown rice.  4 oz (113 g) corn.  8 oz (240 mL) milk.  8 oz (170 g) strawberries with sugar-free whipped topping. Carbohydrate calculation 1. Identify the foods that contain carbohydrates: ? Rice. ? Corn. ? Milk. ? Strawberries. 2. Calculate how many servings you have of each food: ? 2 servings rice. ? 1 serving corn. ? 1 serving milk. ? 1 serving strawberries. 3. Multiply each number of servings by 15 g: ? 2 servings rice x 15 g = 30 g. ? 1 serving corn x 15 g = 15 g. ? 1 serving milk x 15 g = 15 g. ? 1 serving strawberries x 15 g = 15 g. 4. Add together all of the amounts to find the total grams of carbohydrates eaten: ? 30 g + 15 g + 15 g + 15 g = 75 g of carbohydrates total. Summary  Carbohydrate counting is a method of keeping track of how many carbohydrates you eat.  Eating carbohydrates naturally increases the amount of sugar (glucose) in the blood.  Counting how many carbohydrates you eat helps keep your blood glucose within normal limits, which helps you manage your diabetes.  A diet and nutrition specialist (registered dietitian) can help you make a meal plan and calculate how many carbohydrates you should have at each meal and snack. This information is not intended to replace advice given to you by your health care provider. Make sure you discuss any questions you have with your health care provider. Document Revised: 09/15/2016 Document Reviewed: 08/05/2015 Elsevier Patient Education  [place].

## 2019-09-18 ENCOUNTER — Other Ambulatory Visit: Payer: Self-pay | Admitting: Internal Medicine

## 2019-09-18 DIAGNOSIS — Z7901 Long term (current) use of anticoagulants: Secondary | ICD-10-CM

## 2019-09-19 ENCOUNTER — Other Ambulatory Visit: Payer: Self-pay

## 2019-09-19 ENCOUNTER — Other Ambulatory Visit: Payer: Self-pay | Admitting: General Practice

## 2019-09-19 ENCOUNTER — Ambulatory Visit (INDEPENDENT_AMBULATORY_CARE_PROVIDER_SITE_OTHER): Payer: Medicare Other | Admitting: General Practice

## 2019-09-19 DIAGNOSIS — E538 Deficiency of other specified B group vitamins: Secondary | ICD-10-CM

## 2019-09-19 DIAGNOSIS — Z7901 Long term (current) use of anticoagulants: Secondary | ICD-10-CM | POA: Diagnosis not present

## 2019-09-19 DIAGNOSIS — I480 Paroxysmal atrial fibrillation: Secondary | ICD-10-CM

## 2019-09-19 LAB — POCT INR: INR: 2 (ref 2.0–3.0)

## 2019-09-19 MED ORDER — CYANOCOBALAMIN 1000 MCG/ML IJ SOLN
1000.0000 ug | Freq: Once | INTRAMUSCULAR | Status: AC
  Administered 2019-09-19: 1000 ug via INTRAMUSCULAR

## 2019-09-19 NOTE — Progress Notes (Signed)
I have reviewed and agree.

## 2019-09-19 NOTE — Patient Instructions (Addendum)
Pre visit review using our clinic review tool, if applicable. No additional management support is needed unless otherwise documented below in the visit note.  Take 1 1/2 tablets today and then continue to take 1 tablet daily except 2 tablets on Mon and Fridays. Re-check in 6 weeks.

## 2019-09-27 ENCOUNTER — Telehealth: Payer: Self-pay | Admitting: Pharmacist

## 2019-09-27 NOTE — Telephone Encounter (Signed)
Received call from patient. She has been having some trouble with low blood sugars lately, reports BG in the 60s once last week and 72 this morning with symptoms of shakiness. Pt had reduced Toujeo to 40 units last night and BG was still on low side this morning.   Plan: Instructed patient to hold glipizide and continue Toujeo 40 units, and schedule f/u with PCP at her convenience

## 2019-10-01 ENCOUNTER — Other Ambulatory Visit: Payer: Self-pay | Admitting: *Deleted

## 2019-10-01 ENCOUNTER — Ambulatory Visit: Payer: Self-pay | Admitting: Pharmacist

## 2019-10-01 ENCOUNTER — Other Ambulatory Visit: Payer: Self-pay | Admitting: Internal Medicine

## 2019-10-01 DIAGNOSIS — E1151 Type 2 diabetes mellitus with diabetic peripheral angiopathy without gangrene: Secondary | ICD-10-CM

## 2019-10-01 DIAGNOSIS — I152 Hypertension secondary to endocrine disorders: Secondary | ICD-10-CM

## 2019-10-01 DIAGNOSIS — Z794 Long term (current) use of insulin: Secondary | ICD-10-CM

## 2019-10-01 DIAGNOSIS — M797 Fibromyalgia: Secondary | ICD-10-CM

## 2019-10-01 MED ORDER — GABAPENTIN 300 MG PO CAPS: 300.0000 mg | ORAL_CAPSULE | Freq: Three times a day (TID) | ORAL | 0 refills | Status: DC

## 2019-10-01 NOTE — Chronic Care Management (AMB) (Signed)
  Chronic Care Management   Outreach Note  10/01/2019 Name: Raylea Adcox MRN: [id number] DOB: 04-Apr-1945  Referred by: [name], MD Reason for referral : Chronic Care Management (Medication delivery)   Reviewed chart for medication changes ahead of medication coordination call. . No Office visits, Consults, or Hospital visits since last care coordination call/Pharmacist visit.  . Patient is currently holding glipizide due to reported low blood sugar < 70  BP Readings from Last 3 Encounters:  05/08/19 124/76  05/03/19 126/76  04/18/19 122/78    Lab Results  Component Value Date   HGBA1C 9.0 (A) 04/11/2019    Medication management via [facility] pharmacy: Patient obtains medications through Vials  90 Days   Patient is due for next adherence delivery on: 10/04/19. Called patient and reviewed medications and coordinated delivery.  This delivery to include: Onetouch Ultra Blue test strips  Onetouch Ultrasoft Lancets  Telmisartan 40mg  daily  Metoprolol Succinate Er 100mg  daily  Metformin ER 500mg  two tablets twice daily  Magnesium Oxide 400mg  on e twice daily  Gabapentin 300mg  one three times daily  Atorvastatin 40mg  daily   Patient declined the following medications: -spironolactone (plenty on hand due to taking once daily) -Furosemide (plenty on hand) -glipizide (currently holding dose due to low BG) -warfarin (delivered 09/18/19 for 90 ds) -Toujeo and Victoza (receiving through [facility])  Patient needs refills for metformin and gabapentin prescribed by PCP.  Confirmed delivery date of 10/04/19, advised patient that pharmacy will contact them the morning of delivery.   Charlene Brooke, PharmD

## 2019-10-01 NOTE — Patient Outreach (Signed)
[facility]) Care Management  10/01/2019  Kristen [name] 1945-11-01 [id number]   RN Health Coach Monthly Outreach  Referral Date:01/02/2018 Referral Source:MD Referral Screening Reason for Referral:Disease Management Education Insurance:[employer]   Outreach Attempt:  Outreach attempt #3 to patient for follow up.  Patient answered and stated she is unable to talk due to death in the family.  Offered condolences.  Patient requested telephone call back another day.   Plan:  RN Health Coach will make another outreach attempt within the next 14 business days per patient's request.   Hubert Azure RN [place] [phone] [name]

## 2019-10-02 ENCOUNTER — Other Ambulatory Visit: Payer: Self-pay | Admitting: Internal Medicine

## 2019-10-02 ENCOUNTER — Other Ambulatory Visit: Payer: Self-pay | Admitting: *Deleted

## 2019-10-02 DIAGNOSIS — E1151 Type 2 diabetes mellitus with diabetic peripheral angiopathy without gangrene: Secondary | ICD-10-CM

## 2019-10-02 DIAGNOSIS — M47816 Spondylosis without myelopathy or radiculopathy, lumbar region: Secondary | ICD-10-CM | POA: Diagnosis not present

## 2019-10-02 DIAGNOSIS — Z79891 Long term (current) use of opiate analgesic: Secondary | ICD-10-CM | POA: Diagnosis not present

## 2019-10-02 DIAGNOSIS — G894 Chronic pain syndrome: Secondary | ICD-10-CM | POA: Diagnosis not present

## 2019-10-02 DIAGNOSIS — M150 Primary generalized (osteo)arthritis: Secondary | ICD-10-CM | POA: Diagnosis not present

## 2019-10-02 NOTE — Patient Outreach (Signed)
[place] [facility]) Care Management  10/02/2019  Kristen [name] 05/16/1945 [id number]   RN Health Coach Monthly Outreach  Referral Date:01/02/2018 Referral Source:MD Referral Screening Reason for Referral:Disease Management Education Insurance:[employer] Medicare   Outreach Attempt:  Outreach attempt #4 to patient for follow up. No answer and unable to leave voicemail message due to voice message not recording.  Plan:  RN Health Coach will make another outreach attempt within the month of August if no return call back from patient.   [name] [phone] [name]

## 2019-10-03 ENCOUNTER — Other Ambulatory Visit: Payer: Self-pay | Admitting: Internal Medicine

## 2019-10-03 ENCOUNTER — Encounter: Payer: Self-pay | Admitting: Internal Medicine

## 2019-10-03 DIAGNOSIS — E1151 Type 2 diabetes mellitus with diabetic peripheral angiopathy without gangrene: Secondary | ICD-10-CM

## 2019-10-03 MED ORDER — METFORMIN HCL ER 500 MG PO TB24: 1000.0000 mg | ORAL_TABLET | Freq: Two times a day (BID) | ORAL | 0 refills | Status: DC

## 2019-10-09 ENCOUNTER — Ambulatory Visit: Payer: Medicare Other | Admitting: Internal Medicine

## 2019-10-10 ENCOUNTER — Ambulatory Visit (INDEPENDENT_AMBULATORY_CARE_PROVIDER_SITE_OTHER): Payer: Medicare Other | Admitting: Internal Medicine

## 2019-10-10 ENCOUNTER — Encounter: Payer: Self-pay | Admitting: Internal Medicine

## 2019-10-10 ENCOUNTER — Other Ambulatory Visit: Payer: Self-pay

## 2019-10-10 VITALS — BP 120/70 | HR 68 | Temp 97.8°F | Resp 16 | Ht 60.5 in | Wt 213.0 lb

## 2019-10-10 DIAGNOSIS — E039 Hypothyroidism, unspecified: Secondary | ICD-10-CM

## 2019-10-10 DIAGNOSIS — I152 Hypertension secondary to endocrine disorders: Secondary | ICD-10-CM

## 2019-10-10 DIAGNOSIS — E1159 Type 2 diabetes mellitus with other circulatory complications: Secondary | ICD-10-CM

## 2019-10-10 DIAGNOSIS — E1151 Type 2 diabetes mellitus with diabetic peripheral angiopathy without gangrene: Secondary | ICD-10-CM

## 2019-10-10 DIAGNOSIS — Z794 Long term (current) use of insulin: Secondary | ICD-10-CM | POA: Diagnosis not present

## 2019-10-10 DIAGNOSIS — E1149 Type 2 diabetes mellitus with other diabetic neurological complication: Secondary | ICD-10-CM

## 2019-10-10 DIAGNOSIS — I10 Essential (primary) hypertension: Secondary | ICD-10-CM

## 2019-10-10 DIAGNOSIS — D510 Vitamin B12 deficiency anemia due to intrinsic factor deficiency: Secondary | ICD-10-CM | POA: Diagnosis not present

## 2019-10-10 DIAGNOSIS — Z1231 Encounter for screening mammogram for malignant neoplasm of breast: Secondary | ICD-10-CM

## 2019-10-10 DIAGNOSIS — E519 Thiamine deficiency, unspecified: Secondary | ICD-10-CM | POA: Diagnosis not present

## 2019-10-10 MED ORDER — FREESTYLE LIBRE 14 DAY SENSOR MISC: 1.0000 | Freq: Every day | 5 refills | Status: DC

## 2019-10-10 MED ORDER — FREESTYLE LIBRE 14 DAY READER DEVI: 1.0000 | Freq: Every day | 5 refills | Status: DC

## 2019-10-10 NOTE — Patient Instructions (Signed)
Type 2 Diabetes Mellitus, Diagnosis, Adult Type 2 diabetes (type 2 diabetes mellitus) is a long-term (chronic) disease. In type 2 diabetes, one or both of these problems may be present:  The pancreas does not make enough of a hormone called insulin.  Cells in the body do not respond properly to insulin that the body makes (insulin resistance). Normally, insulin allows blood sugar (glucose) to enter cells in the body. The cells use glucose for energy. Insulin resistance or lack of insulin causes excess glucose to build up in the blood instead of going into cells. As a result, high blood glucose (hyperglycemia) develops. What increases the risk? The following factors may make you more likely to develop type 2 diabetes:  Having a family member with type 2 diabetes.  Being overweight or obese.  Having an inactive (sedentary) lifestyle.  Having been diagnosed with insulin resistance.  Having a history of prediabetes, gestational diabetes, or polycystic ovary syndrome (PCOS).  Being of American-Indian, African-American, Hispanic/Latino, or Asian/Pacific Islander descent. What are the signs or symptoms? In the early stage of this condition, you may not have symptoms. Symptoms develop slowly and may include:  Increased thirst (polydipsia).  Increased hunger(polyphagia).  Increased urination (polyuria).  Increased urination during the night (nocturia).  Unexplained weight loss.  Frequent infections that keep coming back (recurring).  Fatigue.  Weakness.  Vision changes, such as blurry vision.  Cuts or bruises that are slow to heal.  Tingling or numbness in the hands or feet.  Dark patches on the skin (acanthosis nigricans). How is this diagnosed? This condition is diagnosed based on your symptoms, your medical history, a physical exam, and your blood glucose level. Your blood glucose may be checked with one or more of the following blood tests:  A fasting blood glucose (FBG)  test. You will not be allowed to eat (you will fast) for 8 hours or longer before a blood sample is taken.  A random blood glucose test. This test checks blood glucose at any time of day regardless of when you ate.  An A1c (hemoglobin A1c) blood test. This test provides information about blood glucose control over the previous 2-3 months.  An oral glucose tolerance test (OGTT). This test measures your blood glucose at two times: ? After fasting. This is your baseline blood glucose level. ? Two hours after drinking a beverage that contains glucose. You may be diagnosed with type 2 diabetes if:  Your FBG level is 126 mg/dL (7.0 mmol/L) or higher.  Your random blood glucose level is 200 mg/dL (11.1 mmol/L) or higher.  Your A1c level is 6.5% or higher.  Your OGTT result is higher than 200 mg/dL (11.1 mmol/L). These blood tests may be repeated to confirm your diagnosis. How is this treated? Your treatment may be managed by a specialist called an endocrinologist. Type 2 diabetes may be treated by following instructions from your health care provider about:  Making diet and lifestyle changes. This may include: ? Following an individualized nutrition plan that is developed by a diet and nutrition specialist (registered dietitian). ? Exercising regularly. ? Finding ways to manage stress.  Checking your blood glucose level as often as told.  Taking diabetes medicines or insulin daily. This helps to keep your blood glucose levels in the healthy range. ? If you use insulin, you may need to adjust the dosage depending on how physically active you are and what foods you eat. Your health care provider will tell you how to adjust your dosage.    Taking medicines to help prevent complications from diabetes, such as: ? Aspirin. ? Medicine to lower cholesterol. ? Medicine to control blood pressure. Your health care provider will set individualized treatment goals for you. Your goals will be based on  your age, other medical conditions you have, and how you respond to diabetes treatment. Generally, the goal of treatment is to maintain the following blood glucose levels:  Before meals (preprandial): 80-130 mg/dL (4.4-7.2 mmol/L).  After meals (postprandial): below 180 mg/dL (10 mmol/L).  A1c level: less than 7%. Follow these instructions at home: Questions to ask your health care provider  Consider asking the following questions: ? Do I need to meet with a diabetes educator? ? Where can I find a support group for people with diabetes? ? What equipment will I need to manage my diabetes at home? ? What diabetes medicines do I need, and when should I take them? ? How often do I need to check my blood glucose? ? What number can I call if I have questions? ? When is my next appointment? General instructions  Take over-the-counter and prescription medicines only as told by your health care provider.  Keep all follow-up visits as told by your health care provider. This is important.  For more information about diabetes, visit: ? American Diabetes Association (ADA): www.diabetes.org ? American Association of Diabetes Educators (AADE): www.diabeteseducator.org Contact a health care provider if:  Your blood glucose is at or above 240 mg/dL (13.3 mmol/L) for 2 days in a row.  You have been sick or have had a fever for 2 days or longer, and you are not getting better.  You have any of the following problems for more than 6 hours: ? You cannot eat or drink. ? You have nausea and vomiting. ? You have diarrhea. Get help right away if:  Your blood glucose is lower than 54 mg/dL (3.0 mmol/L).  You become confused or you have trouble thinking clearly.  You have difficulty breathing.  You have moderate or large ketone levels in your urine. Summary  Type 2 diabetes (type 2 diabetes mellitus) is a long-term (chronic) disease. In type 2 diabetes, the pancreas does not make enough of a  hormone called insulin, or cells in the body do not respond properly to insulin that the body makes (insulin resistance).  This condition is treated by making diet and lifestyle changes and taking diabetes medicines or insulin.  Your health care provider will set individualized treatment goals for you. Your goals will be based on your age, other medical conditions you have, and how you respond to diabetes treatment.  Keep all follow-up visits as told by your health care provider. This is important. This information is not intended to replace advice given to you by your health care provider. Make sure you discuss any questions you have with your health care provider. Document Revised: 04/21/2017 Document Reviewed: 03/27/2015 Elsevier Patient Education  2020 [employer]  

## 2019-10-10 NOTE — Progress Notes (Signed)
Subjective:  Patient ID: [name], female    DOB: [birth year]  Age: 74 y.o. MRN: [id number]  CC: Diabetes and Hypothyroidism  This visit occurred during the SARS-CoV-2 public health emergency.  Safety protocols were in place, including screening questions prior to the visit, additional usage of staff PPE, and extensive cleaning of exam room while observing appropriate contact time as indicated for disinfecting solutions.    HPI Ky Moskowitz presents for f/up - She tells me that her blood sugars are well controlled. She denies polys.  Outpatient Medications Prior to Visit  Medication Sig Dispense Refill  . ALPRAZolam (XANAX) 1 MG tablet Take 1 tablet (1 mg total) by mouth 3 (three) times daily as needed for anxiety. 90 tablet 5  . Ascorbic Acid (VITAMIN C) 1000 MG tablet Take 1,000 mg by mouth daily.    Marland Kitchen atorvastatin (LIPITOR) 40 MG tablet TAKE 1 TABLET(40 MG) BY MOUTH DAILY 90 tablet 1  . Blood Glucose Monitoring Suppl (ONE TOUCH ULTRA 2) w/Device KIT Use as advised 1 each 0  . Cholecalciferol (VITAMIN D3) 50 MCG (2000 UT) capsule Take 2,000 Units by mouth daily.    . empagliflozin (JARDIANCE) 25 MG TABS tablet Take 25 mg by mouth daily. 90 tablet 3  . famotidine (PEPCID) 20 MG tablet Take 1 tablet (20 mg total) by mouth 2 (two) times daily. 40 tablet 0  . fluticasone (FLONASE) 50 MCG/ACT nasal spray Place 1 spray into both nostrils daily as needed for allergies or rhinitis.    . furosemide (LASIX) 20 MG tablet Take 1 tablet (20 mg total) by mouth daily. 90 tablet 1  . gabapentin (NEURONTIN) 300 MG capsule Take 1 capsule (300 mg total) by mouth 3 (three) times daily. 270 capsule 0  . glucose blood (COOL BLOOD GLUCOSE TEST STRIPS) test strip Use to test blood sugar twice daily. DX: E11.9 100 each 12  . Insulin Pen Needle (NOVOFINE) 32G X 6 MM MISC 1 Act by Does not apply route daily. 100 each 3  . Lancets (ONETOUCH ULTRASOFT) lancets Use as instructed 100 each 12  .  liraglutide (VICTOZA) 18 MG/3ML SOPN Inject 0.3 mLs (1.8 mg total) into the skin every morning. 27 mL 1  . magnesium oxide (MAG-OX) 400 MG tablet TAKE ONE TABLET BY MOUTH BY MOUTH TWICE DAILY WITH BREAKFAST AND EVENING MEAL] 180 tablet 2  . metFORMIN (GLUCOPHAGE-XR) 500 MG 24 hr tablet Take 2 tablets (1,000 mg total) by mouth 2 (two) times daily with a meal. 360 tablet 0  . metoprolol succinate (TOPROL-XL) 100 MG 24 hr tablet Take 1 tablet (100 mg total) by mouth 2 (two) times daily. Take with or immediately following a meal. 180 tablet 3  . Multiple Vitamin (MULTIVITAMIN) tablet Take 1 tablet by mouth daily.    . nitroGLYCERIN (NITROSTAT) 0.4 MG SL tablet Place 1 tablet (0.4 mg total) under the tongue every 5 (five) minutes as needed for chest pain (x 3 doses). Reported on 05/08/2015 35 tablet 2  . ondansetron (ZOFRAN-ODT) 4 MG disintegrating tablet Take 1 tablet (4 mg total) by mouth every 8 (eight) hours as needed for nausea or vomiting. 60 tablet 2  . PERCOCET 10-325 MG tablet Take 1 tablet by mouth 5 (five) times daily as needed.    . potassium chloride SA (KLOR-CON) 20 MEQ tablet Take 1 tablet (20 mEq total) by mouth 2 (two) times daily. 180 tablet 3  . spironolactone (ALDACTONE) 25 MG tablet Take 1 tablet (25 mg total)  by mouth 2 (two) times daily. (Patient taking differently: Take 25 mg by mouth daily. ) 180 tablet 1  . telmisartan (MICARDIS) 40 MG tablet Take 1 tablet (40 mg total) by mouth daily. 90 tablet 1  . thiamine 100 MG tablet Take 1 tablet (100 mg total) by mouth daily. 90 tablet 1  . triamcinolone cream (KENALOG) 0.5 % Apply 1 application topically 3 (three) times daily. 60 g 3  . warfarin (COUMADIN) 3 MG tablet TAKE ONE TABLET BY MOUTH DAILY OR AS DIRECTED by anticoagulation clinic 60 tablet 2  . cetirizine (ZYRTEC) 10 MG tablet Take 1 tablet (10 mg total) by mouth at bedtime. 20 tablet 0  . EPINEPHrine 0.3 mg/0.3 mL IJ SOAJ injection Inject 0.3 mLs (0.3 mg total) into the muscle as  needed for anaphylaxis. 1 each 1  . insulin glargine, 2 Unit Dial, (TOUJEO MAX SOLOSTAR) 300 UNIT/ML Solostar Pen Inject 50 Units into the skin daily. 15 mL 5   No facility-administered medications prior to visit.    ROS Review of Systems  Constitutional: Positive for unexpected weight change (wt gain). Negative for appetite change, chills, diaphoresis and fatigue.  HENT: Negative.   Eyes: Negative for visual disturbance.  Respiratory: Negative for cough, chest tightness, shortness of breath and wheezing.   Cardiovascular: Negative for chest pain, palpitations and leg swelling.  Gastrointestinal: Negative for abdominal pain, constipation, diarrhea, nausea and vomiting.  Endocrine: Negative.  Negative for polydipsia, polyphagia and polyuria.  Genitourinary: Negative.  Negative for difficulty urinating.  Musculoskeletal: Negative for arthralgias and myalgias.  Skin: Negative.  Negative for color change and pallor.  Neurological: Negative for dizziness, weakness, light-headedness and headaches.  Hematological: Negative for adenopathy. Does not bruise/bleed easily.  Psychiatric/Behavioral: Negative.     Objective:  BP 120/70 (BP Location: Left Arm, Patient Position: Sitting, Cuff Size: Large)   Pulse 68   Temp 97.8 F (36.6 C) (Oral)   Resp 16   Ht 5' 0.5" (1.537 m)   Wt 213 lb (96.6 kg)   SpO2 97%   BMI 40.91 kg/m   BP Readings from Last 3 Encounters:  10/10/19 120/70  05/08/19 124/76  05/03/19 126/76    Wt Readings from Last 3 Encounters:  10/10/19 213 lb (96.6 kg)  04/18/19 216 lb 6.4 oz (98.2 kg)  04/11/19 219 lb (99.3 kg)    Physical Exam Vitals reviewed.  Constitutional:      General: She is not in acute distress.    Appearance: She is obese.  HENT:     Nose: Nose normal.     Mouth/Throat:     Mouth: Mucous membranes are moist.  Eyes:     General: No scleral icterus.    Conjunctiva/sclera: Conjunctivae normal.  Cardiovascular:     Rate and Rhythm: Normal  rate and regular rhythm.     Heart sounds: No murmur heard.   Pulmonary:     Effort: Pulmonary effort is normal.     Breath sounds: No stridor. No wheezing, rhonchi or rales.  Abdominal:     General: Abdomen is protuberant. Bowel sounds are normal. There is no distension.     Palpations: Abdomen is soft. There is no hepatomegaly, splenomegaly or mass.     Tenderness: There is no abdominal tenderness.  Musculoskeletal:        General: Normal range of motion.     Cervical back: Neck supple.     Right lower leg: No edema.     Left lower leg: No edema.  Lymphadenopathy:     Cervical: No cervical adenopathy.  Skin:    General: Skin is dry.     Coloration: Skin is not pale.  Neurological:     General: No focal deficit present.     Mental Status: She is alert.  Psychiatric:        Mood and Affect: Mood normal.        Behavior: Behavior normal.     Lab Results  Component Value Date   WBC 6.0 10/10/2019   HGB 12.2 10/10/2019   HCT 39.8 10/10/2019   PLT 308 10/10/2019   GLUCOSE 161 (H) 10/10/2019   CHOL 126 04/11/2019   TRIG 123.0 04/11/2019   HDL 36.70 (L) 04/11/2019   LDLDIRECT 116.0 11/28/2008   LDLCALC 65 04/11/2019   ALT 13 04/11/2019   AST 12 04/11/2019   NA 138 10/10/2019   K 4.5 10/10/2019   CL 104 10/10/2019   CREATININE 0.86 10/10/2019   BUN 19 10/10/2019   CO2 16 (L) 10/10/2019   TSH 1.55 10/10/2019   INR 2.0 09/19/2019   HGBA1C 8.3 (H) 10/10/2019   MICROALBUR <0.7 04/11/2019    MR SHOULDER LEFT WO CONTRAST  Result Date: 10/02/2017 CLINICAL DATA:  Progressive left shoulder pain. Remote history of rotator cuff surgery. EXAM: MRI OF THE LEFT SHOULDER WITHOUT CONTRAST TECHNIQUE: Multiplanar, multisequence MR imaging of the shoulder was performed. No intravenous contrast was administered. COMPARISON:  Radiograph 08/16/2017 FINDINGS: Rotator cuff: Moderate rotator cuff tendinopathy/tendinosis but no recurrent tear is identified. There are interstitial tears but no  partial thickness tear. Muscles: Mild diffuse fatty change involving the shoulder musculature. Biceps long head:  Intact Acromioclavicular Joint: Postoperative changes from prior bony decompressive surgery. No findings for bony impingement. Glenohumeral Joint: Moderate to advanced degenerative changes with moderate cartilage loss, joint space narrowing, osteophytic spurring, subchondral cystic change, small joint effusion and mild to moderate synovitis. Labrum:  Labral degenerative changes.  No gross tear. Bones: No acute bony findings. Subchondral cystic change and possible intraosseous ganglion in the humeral head. Other: Moderate subacromial/subdeltoid bursitis. Benign-appearing lipoma is noted near the axillary recess. IMPRESSION: 1. Surgical changes from prior rotator cuff repair and bony decompressive surgery. No findings for recurrent rotator cuff tear. Moderate supraspinatus tendinopathy/tendinosis. 2. Moderate to advanced glenohumeral joint degenerative changes as detailed above. There is a small joint effusion and mild to moderate synovitis. 3. Intact long head biceps tendon. 4. Advanced labral degenerative changes without obvious tear. 5. Moderate subacromial/subdeltoid bursitis. Electronically Signed   By: Marijo Sanes M.D.   On: 10/02/2017 08:32    Assessment & Plan:   Kimberely was seen today for diabetes and hypothyroidism.  Diagnoses and all orders for this visit:  Acquired hypothyroidism- Her TSH is in the normal range. Will continue the current T4 dose. -     TSH; Future -     TSH  Type 2 diabetes mellitus with other neurologic complication, with long-term current use of insulin ([place])- Her blood sugar is adequately well controlled but she has + gap acidosis. I have asked her to be more compliant with the insulin and RTC to check for ketonuria. -     BASIC METABOLIC PANEL WITH GFR; Future -     Hemoglobin A1c; Future -     HM Diabetes Foot Exam -     Continuous Blood Gluc Sensor  (FREESTYLE LIBRE 14 DAY SENSOR) MISC; 1 Act by Does not apply route daily. -     Continuous Blood Gluc Receiver (FREESTYLE  LIBRE 14 DAY READER) DEVI; 1 Act by Does not apply route daily. -     Hemoglobin A1c -     BASIC METABOLIC PANEL WITH GFR -     insulin glargine, 2 Unit Dial, (TOUJEO MAX SOLOSTAR) 300 UNIT/ML Solostar Pen; Inject 50 Units into the skin daily.  Visit for screening mammogram -     MM DIGITAL SCREENING BILATERAL; Future  Thiamine deficiency- Her H/H are normal now. -     CBC with Differential/Platelet; Future -     CBC with Differential/Platelet  Vitamin B12 deficiency anemia due to intrinsic factor deficiency- Will continue parenteral B12 replacement therapy. -     CBC with Differential/Platelet; Future -     Folate; Future -     Folate -     CBC with Differential/Platelet  DM (diabetes mellitus), type 2 with peripheral vascular complications (HCC) -     insulin glargine, 2 Unit Dial, (TOUJEO MAX SOLOSTAR) 300 UNIT/ML Solostar Pen; Inject 50 Units into the skin daily.  Hypertension associated with diabetes ([place])- Her BP is well controlled.   I have discontinued Coda Filler Dick's cetirizine and EPINEPHrine. I am also having her start on FreeStyle Libre 14 Day Sensor and [employer] 14 Day Reader. Additionally, I am having her maintain her ONE TOUCH ULTRA 2, nitroGLYCERIN, triamcinolone cream, Jardiance, ondansetron, Percocet, multivitamin, vitamin C, Vitamin D3, fluticasone, thiamine, famotidine, metoprolol succinate, potassium chloride SA, telmisartan, spironolactone, NovoFine, furosemide, onetouch ultrasoft, Cool Blood Glucose Test Strips, Victoza, atorvastatin, ALPRAZolam, magnesium oxide, warfarin, gabapentin, metFORMIN, and Toujeo Max SoloStar.  Meds ordered this encounter  Medications  . Continuous Blood Gluc Sensor (FREESTYLE LIBRE 14 DAY SENSOR) MISC    Sig: 1 Act by Does not apply route daily.    Dispense:  2 each    Refill:  5  . Continuous Blood  Gluc Receiver (FREESTYLE LIBRE 14 DAY READER) DEVI    Sig: 1 Act by Does not apply route daily.    Dispense:  2 each    Refill:  5  . insulin glargine, 2 Unit Dial, (TOUJEO MAX SOLOSTAR) 300 UNIT/ML Solostar Pen    Sig: Inject 50 Units into the skin daily.    Dispense:  15 mL    Refill:  5   I spent 50 minutes in preparing to see the patient by review of recent labs, imaging and procedures, obtaining and reviewing separately obtained history, communicating with the patient and family or caregiver, ordering medications, tests or procedures, and documenting clinical information in the EHR including the differential Dx, treatment, and any further evaluation and other management of 1. Acquired hypothyroidism 2. Type 2 diabetes mellitus with other neurologic complication, with long-term current use of insulin ([place]) 3.Thiamine deficiency 4. Vitamin B12 deficiency anemia due to intrinsic factor deficiency 5. DM (diabetes mellitus), type 2 with peripheral vascular complications ([place]) 6. Hypertension associated with diabetes ([place])     Follow-up: Return in about 6 months (around 04/11/2020).  Scarlette Calico, MD

## 2019-10-11 ENCOUNTER — Encounter: Payer: Self-pay | Admitting: Internal Medicine

## 2019-10-11 LAB — CBC WITH DIFFERENTIAL/PLATELET
Absolute Monocytes: 642 cells/uL (ref 200–950)
Basophils Absolute: 42 cells/uL (ref 0–200)
Basophils Relative: 0.7 %
Eosinophils Absolute: 108 cells/uL (ref 15–500)
Eosinophils Relative: 1.8 %
HCT: 39.8 % (ref 35.0–45.0)
Hemoglobin: 12.2 g/dL (ref 11.7–15.5)
Lymphs Abs: 2280 cells/uL (ref 850–3900)
MCH: 25.4 pg — ABNORMAL LOW (ref 27.0–33.0)
MCHC: 30.7 g/dL — ABNORMAL LOW (ref 32.0–36.0)
MCV: 82.9 fL (ref 80.0–100.0)
MPV: 10.6 fL (ref 7.5–12.5)
Monocytes Relative: 10.7 %
Neutro Abs: 2928 cells/uL (ref 1500–7800)
Neutrophils Relative %: 48.8 %
Platelets: 308 10*3/uL (ref 140–400)
RBC: 4.8 10*6/uL (ref 3.80–5.10)
RDW: 15.6 % — ABNORMAL HIGH (ref 11.0–15.0)
Total Lymphocyte: 38 %
WBC: 6 10*3/uL (ref 3.8–10.8)

## 2019-10-11 LAB — TSH: TSH: 1.55 mIU/L (ref 0.40–4.50)

## 2019-10-11 LAB — BASIC METABOLIC PANEL WITH GFR
BUN: 19 mg/dL (ref 7–25)
CO2: 16 mmol/L — ABNORMAL LOW (ref 20–32)
Calcium: 9.8 mg/dL (ref 8.6–10.4)
Chloride: 104 mmol/L (ref 98–110)
Creat: 0.86 mg/dL (ref 0.60–0.93)
GFR, Est African American: 79 mL/min/{1.73_m2} (ref >=60)
GFR, Est Non African American: 68 mL/min/{1.73_m2} (ref >=60)
Glucose, Bld: 161 mg/dL — ABNORMAL HIGH (ref 65–99)
Potassium: 4.5 mmol/L (ref 3.5–5.3)
Sodium: 138 mmol/L (ref 135–146)

## 2019-10-11 LAB — HEMOGLOBIN A1C
Hgb A1c MFr Bld: 8.3 % of total Hgb — ABNORMAL HIGH (ref <5.7)
Mean Plasma Glucose: 192 (calc)
eAG (mmol/L): 10.6 (calc)

## 2019-10-11 LAB — FOLATE: Folate: >24 ng/mL

## 2019-10-11 MED ORDER — TOUJEO MAX SOLOSTAR 300 UNIT/ML ~~LOC~~ SOPN: 50.0000 [IU] | PEN_INJECTOR | Freq: Every day | SUBCUTANEOUS | 5 refills | Status: DC

## 2019-10-16 ENCOUNTER — Telehealth: Payer: Self-pay | Admitting: Pharmacist

## 2019-10-16 ENCOUNTER — Other Ambulatory Visit: Payer: Self-pay

## 2019-10-16 ENCOUNTER — Encounter: Payer: Self-pay | Admitting: Internal Medicine

## 2019-10-16 ENCOUNTER — Ambulatory Visit (INDEPENDENT_AMBULATORY_CARE_PROVIDER_SITE_OTHER): Payer: Medicare Other | Admitting: Internal Medicine

## 2019-10-16 VITALS — BP 112/72 | HR 78 | Temp 99.0°F | Resp 16 | Ht 60.5 in | Wt 211.0 lb

## 2019-10-16 DIAGNOSIS — D510 Vitamin B12 deficiency anemia due to intrinsic factor deficiency: Secondary | ICD-10-CM

## 2019-10-16 DIAGNOSIS — E1110 Type 2 diabetes mellitus with ketoacidosis without coma: Secondary | ICD-10-CM | POA: Diagnosis not present

## 2019-10-16 LAB — POC URINALSYSI DIPSTICK (AUTOMATED)
Bilirubin, UA: NEGATIVE
Blood, UA: NEGATIVE
Glucose, UA: POSITIVE — AB
Ketones, UA: NEGATIVE
Leukocytes, UA: NEGATIVE
Nitrite, UA: NEGATIVE
Protein, UA: NEGATIVE
Spec Grav, UA: 1.015 (ref 1.010–1.025)
Urobilinogen, UA: 0.2 E.U./dL
pH, UA: 6 (ref 5.0–8.0)

## 2019-10-16 NOTE — Progress Notes (Signed)
Chronic Care Management Pharmacy Assistant   Name: Kristen [name]  MRN: [id number] DOB: 16-Dec-1945  Reason for Encounter: Medication Review  Patient Questions:  1.  Have you seen any other providers since your last visit? Yes , The patient had an appointment with Dr. Ronnald Ramp on 10/10/2019.   2.  Any changes in your medicines or health? Yes, The patient was discontinued on cetirizine and epinephrine. The patient was started on Freestyle Libre 14 day sensor and Freestyle Libre 14 day Reader.    PCP : [name], MD  Allergies:   Allergies  Allergen Reactions  . Metformin And Related Other (See Comments)    Must take XR form only, cannot tolerate Regular release metformin  . Cleocin [Clindamycin Hcl] Diarrhea  . Codeine Itching  . Doxycycline Hyclate Diarrhea  . Macrolides And Ketolides Diarrhea  . Morphine Hives  . Pentazocine Lactate Itching and Nausea And Vomiting    (GENERIC- [place])  . Vibramycin [Doxycycline Calcium] Diarrhea and Rash  . Definity [Perflutren Lipid Microsphere] Other (See Comments)    Patient complained of warm sensation in chest x few seconds duration when injected Definity.No other symptoms  . Sulfonamide Derivatives Rash    Medications: Outpatient Encounter Medications as of 10/16/2019  Medication Sig  . ALPRAZolam (XANAX) 1 MG tablet Take 1 tablet (1 mg total) by mouth 3 (three) times daily as needed for anxiety.  . Ascorbic Acid (VITAMIN C) 1000 MG tablet Take 1,000 mg by mouth daily.  Marland Kitchen atorvastatin (LIPITOR) 40 MG tablet TAKE 1 TABLET(40 MG) BY MOUTH DAILY  . Blood Glucose Monitoring Suppl (ONE TOUCH ULTRA 2) w/Device KIT Use as advised  . Cholecalciferol (VITAMIN D3) 50 MCG (2000 UT) capsule Take 2,000 Units by mouth daily.  . Continuous Blood Gluc Receiver (FREESTYLE LIBRE 14 DAY READER) DEVI 1 Act by Does not apply route daily.  . Continuous Blood Gluc Sensor (FREESTYLE LIBRE 14 DAY SENSOR) MISC 1 Act by Does not apply route daily.  .  empagliflozin (JARDIANCE) 25 MG TABS tablet Take 25 mg by mouth daily.  . famotidine (PEPCID) 20 MG tablet Take 1 tablet (20 mg total) by mouth 2 (two) times daily.  . fluticasone (FLONASE) 50 MCG/ACT nasal spray Place 1 spray into both nostrils daily as needed for allergies or rhinitis.  . furosemide (LASIX) 20 MG tablet Take 1 tablet (20 mg total) by mouth daily.  Marland Kitchen gabapentin (NEURONTIN) 300 MG capsule Take 1 capsule (300 mg total) by mouth 3 (three) times daily.  Marland Kitchen glucose blood (COOL BLOOD GLUCOSE TEST STRIPS) test strip Use to test blood sugar twice daily. DX: E11.9  . insulin glargine, 2 Unit Dial, (TOUJEO MAX SOLOSTAR) 300 UNIT/ML Solostar Pen Inject 50 Units into the skin daily.  . Insulin Pen Needle (NOVOFINE) 32G X 6 MM MISC 1 Act by Does not apply route daily.  . Lancets (ONETOUCH ULTRASOFT) lancets Use as instructed  . liraglutide (VICTOZA) 18 MG/3ML SOPN Inject 0.3 mLs (1.8 mg total) into the skin every morning.  . magnesium oxide (MAG-OX) 400 MG tablet TAKE ONE TABLET BY MOUTH BY MOUTH TWICE DAILY WITH BREAKFAST AND EVENING MEAL]  . metFORMIN (GLUCOPHAGE-XR) 500 MG 24 hr tablet Take 2 tablets (1,000 mg total) by mouth 2 (two) times daily with a meal.  . metoprolol succinate (TOPROL-XL) 100 MG 24 hr tablet Take 1 tablet (100 mg total) by mouth 2 (two) times daily. Take with or immediately following a meal.  . Multiple Vitamin (MULTIVITAMIN) tablet  Take 1 tablet by mouth daily.  . nitroGLYCERIN (NITROSTAT) 0.4 MG SL tablet Place 1 tablet (0.4 mg total) under the tongue every 5 (five) minutes as needed for chest pain (x 3 doses). Reported on 05/08/2015  . ondansetron (ZOFRAN-ODT) 4 MG disintegrating tablet Take 1 tablet (4 mg total) by mouth every 8 (eight) hours as needed for nausea or vomiting.  Marland Kitchen PERCOCET 10-325 MG tablet Take 1 tablet by mouth 5 (five) times daily as needed.  . potassium chloride SA (KLOR-CON) 20 MEQ tablet Take 1 tablet (20 mEq total) by mouth 2 (two) times daily.  Marland Kitchen  spironolactone (ALDACTONE) 25 MG tablet Take 1 tablet (25 mg total) by mouth 2 (two) times daily. (Patient taking differently: Take 25 mg by mouth daily. )  . telmisartan (MICARDIS) 40 MG tablet Take 1 tablet (40 mg total) by mouth daily.  Marland Kitchen thiamine 100 MG tablet Take 1 tablet (100 mg total) by mouth daily.  Marland Kitchen triamcinolone cream (KENALOG) 0.5 % Apply 1 application topically 3 (three) times daily.  Marland Kitchen warfarin (COUMADIN) 3 MG tablet TAKE ONE TABLET BY MOUTH DAILY OR AS DIRECTED by anticoagulation clinic  . [DISCONTINUED] atorvastatin (LIPITOR) 40 MG tablet Take 1 tablet (40 mg total) by mouth daily.  . [DISCONTINUED] spironolactone (ALDACTONE) 25 MG tablet Take 1 tablet (25 mg total) by mouth 2 (two) times daily.   No facility-administered encounter medications on file as of 10/16/2019.    Current Diagnosis: Patient Active Problem List   Diagnosis Date Noted  . Thiamine deficiency 12/30/2018  . Vitamin B12 deficiency anemia due to intrinsic factor deficiency 12/20/2018  . CHF (congestive heart failure), NYHA class I, chronic, combined ([place]) 12/19/2018  . Coronary artery disease involving native coronary artery of native heart with angina pectoris ([place]) 12/19/2018  . Obesity with body mass index (BMI) of 30.0 to 39.9 01/15/2018  . Chronic idiopathic constipation 12/27/2017  . Atrial fibrillation ([place]) 10/30/2017  . Type 2 diabetes mellitus ([place]) 10/30/2017  . Eczema 08/07/2017  . Insomnia secondary to chronic pain 08/04/2016  . Visit for screening mammogram 04/14/2016  . LV dysfunction   . Routine general medical examination at a health care facility 05/03/2013  . DM (diabetes mellitus), type 2 with peripheral vascular complications ([place]) [id number]/69/4854  . PVC's/nonsustained VT 10/24/2012  . H/O: CVA (cerebrovascular accident) 02/09/2012  . Hypothyroidism 05/27/2011  . Fibromyalgia 10/08/2010  . Long term current use of anticoagulant 05/05/2010  . Low back pain, non-specific 05/12/2008  .  Cognitive dysfunction associated with depression 01/29/2007  . Osteoarthritis 11/22/2006  . Hyperlipidemia associated with type 2 diabetes mellitus ([place]) 09/13/2006  . Coronary atherosclerosis 09/13/2006  . Paroxysmal atrial fibrillation ([place]) 09/13/2006  . GERD 09/13/2006  . Hypertension associated with diabetes ([place]) 08/30/2006    Goals Addressed   None     Follow-Up:  Coordination of Enhanced Pharmacy Services   Reviewed chart for medication changes ahead of medication coordination call.  The patient was seen for an office visit with Dr. Ronnald Ramp her PCP on 10/10/2019. Dr. Ronnald Ramp had made some changes to the patients mediations she was discontinued on cetirizine and epinephrine. She was started on [employer] 14 day sensor and [employer] 14 day Reader.    BP Readings from Last 3 Encounters:  10/10/19 120/70  05/08/19 124/76  05/03/19 126/76    Lab Results  Component Value Date   HGBA1C 8.3 (H) 10/10/2019     Patient obtains medications through Vials  90 Days   Last adherence  delivery included: Mag Oxide '400mg'$ , Telmisartan '40mg'$ , Atorvastatin 40, Gabapentin '300mg'$ , Warfarin '3mg'$ , Onetouch Ultrasoft lancets, Onetouch Ultra Blue test strips.  Patient is due for next adherence delivery on: 10/18/2019 Called patient and reviewed medications and coordinated delivery.  This delivery to include:  Alprazolam '1mg'$  one tablet 3 times daily as needed  Metformin Er '500mg'$  two capsules twice daily  Potassium Cl ER 40mq twice daily  Percocet 10-'325mg'$  one tablet  five times daily as needed  Cyclobenzaprine '10mg'$  one tablet  three times daily as needed Metoprolol Succinate was delivered on 7/28 but patient stated that she can not find the medication bottle she doesn't think she ever received the medication  but she was charged. Please resend out as well.   Confirmed delivery date of 10/18/2019, advised patient that pharmacy will contact them the morning of delivery.   DRosendo Gros CDeerfield BeachPharmacist Assistant  [phone]

## 2019-10-16 NOTE — Progress Notes (Signed)
Subjective:  Patient ID: Kristen [name], female    DOB: [birth year]  Age: 74 y.o. MRN: [id number]  CC: Follow-up  This visit occurred during the SARS-CoV-2 public health emergency.  Safety protocols were in place, including screening questions prior to the visit, additional usage of staff PPE, and extensive cleaning of exam room while observing appropriate contact time as indicated for disinfecting solutions.    HPI Kristen [name] presents for f/up - She was seen about a week ago for follow-up of DM 2, insulin requiring.  She was found to have an acidosis with a mildly elevated gap.  She now informs me that she sometimes confuses victiza with her basal insulin.  Since she was last seen she has been more compliant with the basal insulin.  She complains of fatigue but offers no other complaints.  Outpatient Medications Prior to Visit  Medication Sig Dispense Refill   ALPRAZolam (XANAX) 1 MG tablet Take 1 tablet (1 mg total) by mouth 3 (three) times daily as needed for anxiety. 90 tablet 5   Ascorbic Acid (VITAMIN C) 1000 MG tablet Take 1,000 mg by mouth daily.     atorvastatin (LIPITOR) 40 MG tablet TAKE 1 TABLET(40 MG) BY MOUTH DAILY 90 tablet 1   Blood Glucose Monitoring Suppl (ONE TOUCH ULTRA 2) w/Device KIT Use as advised 1 each 0   Cholecalciferol (VITAMIN D3) 50 MCG (2000 UT) capsule Take 2,000 Units by mouth daily.     Continuous Blood Gluc Receiver (FREESTYLE LIBRE 14 DAY READER) DEVI 1 Act by Does not apply route daily. 2 each 5   Continuous Blood Gluc Sensor (FREESTYLE LIBRE 14 DAY SENSOR) MISC 1 Act by Does not apply route daily. 2 each 5   empagliflozin (JARDIANCE) 25 MG TABS tablet Take 25 mg by mouth daily. 90 tablet 3   famotidine (PEPCID) 20 MG tablet Take 1 tablet (20 mg total) by mouth 2 (two) times daily. 40 tablet 0   fluticasone (FLONASE) 50 MCG/ACT nasal spray Place 1 spray into both nostrils daily as needed for allergies or rhinitis.     furosemide  (LASIX) 20 MG tablet Take 1 tablet (20 mg total) by mouth daily. 90 tablet 1   gabapentin (NEURONTIN) 300 MG capsule Take 1 capsule (300 mg total) by mouth 3 (three) times daily. 270 capsule 0   glucose blood (COOL BLOOD GLUCOSE TEST STRIPS) test strip Use to test blood sugar twice daily. DX: E11.9 100 each 12   Insulin Pen Needle (NOVOFINE) 32G X 6 MM MISC 1 Act by Does not apply route daily. 100 each 3   Lancets (ONETOUCH ULTRASOFT) lancets Use as instructed 100 each 12   liraglutide (VICTOZA) 18 MG/3ML SOPN Inject 0.3 mLs (1.8 mg total) into the skin every morning. 27 mL 1   magnesium oxide (MAG-OX) 400 MG tablet TAKE ONE TABLET BY MOUTH BY MOUTH TWICE DAILY WITH BREAKFAST AND EVENING MEAL] 180 tablet 2   metFORMIN (GLUCOPHAGE-XR) 500 MG 24 hr tablet Take 2 tablets (1,000 mg total) by mouth 2 (two) times daily with a meal. 360 tablet 0   metoprolol succinate (TOPROL-XL) 100 MG 24 hr tablet Take 1 tablet (100 mg total) by mouth 2 (two) times daily. Take with or immediately following a meal. 180 tablet 3   Multiple Vitamin (MULTIVITAMIN) tablet Take 1 tablet by mouth daily.     nitroGLYCERIN (NITROSTAT) 0.4 MG SL tablet Place 1 tablet (0.4 mg total) under the tongue every 5 (five) minutes as needed  for chest pain (x 3 doses). Reported on 05/08/2015 35 tablet 2   ondansetron (ZOFRAN-ODT) 4 MG disintegrating tablet Take 1 tablet (4 mg total) by mouth every 8 (eight) hours as needed for nausea or vomiting. 60 tablet 2   PERCOCET 10-325 MG tablet Take 1 tablet by mouth 5 (five) times daily as needed.     potassium chloride SA (KLOR-CON) 20 MEQ tablet Take 1 tablet (20 mEq total) by mouth 2 (two) times daily. 180 tablet 3   spironolactone (ALDACTONE) 25 MG tablet Take 1 tablet (25 mg total) by mouth 2 (two) times daily. (Patient taking differently: Take 25 mg by mouth daily. ) 180 tablet 1   telmisartan (MICARDIS) 40 MG tablet Take 1 tablet (40 mg total) by mouth daily. 90 tablet 1    thiamine 100 MG tablet Take 1 tablet (100 mg total) by mouth daily. 90 tablet 1   triamcinolone cream (KENALOG) 0.5 % Apply 1 application topically 3 (three) times daily. 60 g 3   warfarin (COUMADIN) 3 MG tablet TAKE ONE TABLET BY MOUTH DAILY OR AS DIRECTED by anticoagulation clinic 60 tablet 2   insulin glargine, 2 Unit Dial, (TOUJEO MAX SOLOSTAR) 300 UNIT/ML Solostar Pen Inject 50 Units into the skin daily. 15 mL 5   No facility-administered medications prior to visit.    ROS Review of Systems  Constitutional: Positive for fatigue. Negative for appetite change, chills, diaphoresis, fever and unexpected weight change.  HENT: Negative for trouble swallowing.   Eyes: Negative.   Respiratory: Negative for cough, chest tightness, shortness of breath and wheezing.   Cardiovascular: Negative for chest pain, palpitations and leg swelling.  Gastrointestinal: Negative for abdominal pain, constipation, diarrhea, nausea and vomiting.  Endocrine: Negative.  Negative for cold intolerance, heat intolerance, polydipsia, polyphagia and polyuria.  Genitourinary: Negative.  Negative for difficulty urinating, dysuria, hematuria and urgency.  Musculoskeletal: Negative.   Skin: Negative.   Neurological: Negative for dizziness, weakness and light-headedness.  Hematological: Negative for adenopathy. Does not bruise/bleed easily.  Psychiatric/Behavioral: Negative.     Objective:  BP 112/72 (BP Location: Left Arm, Patient Position: Sitting, Cuff Size: Large)    Pulse 78    Temp 99 F (37.2 C) (Oral)    Resp 16    Ht 5' 0.5" (1.537 m)    Wt 211 lb (95.7 kg)    SpO2 98%    BMI 40.53 kg/m   BP Readings from Last 3 Encounters:  10/16/19 112/72  10/10/19 120/70  05/08/19 124/76    Wt Readings from Last 3 Encounters:  10/16/19 211 lb (95.7 kg)  10/10/19 213 lb (96.6 kg)  04/18/19 216 lb 6.4 oz (98.2 kg)    Physical Exam Vitals reviewed.  Constitutional:      Appearance: She is obese.  HENT:      Nose: Nose normal.     Mouth/Throat:     Mouth: Mucous membranes are moist.  Eyes:     General: No scleral icterus.    Conjunctiva/sclera: Conjunctivae normal.  Cardiovascular:     Rate and Rhythm: Normal rate and regular rhythm.  Pulmonary:     Effort: Pulmonary effort is normal. No respiratory distress.     Breath sounds: No wheezing or rales.  Abdominal:     General: Abdomen is protuberant. Bowel sounds are normal. There is no distension.     Palpations: Abdomen is soft. There is no hepatomegaly, splenomegaly or mass.     Tenderness: There is no abdominal tenderness.  Musculoskeletal:  General: Normal range of motion.     Cervical back: Neck supple.     Right lower leg: No edema.     Left lower leg: No edema.  Lymphadenopathy:     Cervical: No cervical adenopathy.  Skin:    General: Skin is warm and dry.     Coloration: Skin is not pale.  Neurological:     General: No focal deficit present.     Mental Status: She is alert.  Psychiatric:        Mood and Affect: Mood normal.        Behavior: Behavior normal.     Lab Results  Component Value Date   WBC 6.0 10/10/2019   HGB 12.2 10/10/2019   HCT 39.8 10/10/2019   PLT 308 10/10/2019   GLUCOSE 122 (H) 10/16/2019   CHOL 126 04/11/2019   TRIG 123.0 04/11/2019   HDL 36.70 (L) 04/11/2019   LDLDIRECT 116.0 11/28/2008   LDLCALC 65 04/11/2019   ALT 13 04/11/2019   AST 12 04/11/2019   NA 139 10/16/2019   K 4.3 10/16/2019   CL 103 10/16/2019   CREATININE 0.90 10/16/2019   BUN 18 10/16/2019   CO2 21 10/16/2019   TSH 1.55 10/10/2019   INR 2.0 09/19/2019   HGBA1C 8.3 (H) 10/10/2019   MICROALBUR <0.7 04/11/2019    MR SHOULDER LEFT WO CONTRAST  Result Date: 10/02/2017 CLINICAL DATA:  Progressive left shoulder pain. Remote history of rotator cuff surgery. EXAM: MRI OF THE LEFT SHOULDER WITHOUT CONTRAST TECHNIQUE: Multiplanar, multisequence MR imaging of the shoulder was performed. No intravenous contrast was  administered. COMPARISON:  Radiograph 08/16/2017 FINDINGS: Rotator cuff: Moderate rotator cuff tendinopathy/tendinosis but no recurrent tear is identified. There are interstitial tears but no partial thickness tear. Muscles: Mild diffuse fatty change involving the shoulder musculature. Biceps long head:  Intact Acromioclavicular Joint: Postoperative changes from prior bony decompressive surgery. No findings for bony impingement. Glenohumeral Joint: Moderate to advanced degenerative changes with moderate cartilage loss, joint space narrowing, osteophytic spurring, subchondral cystic change, small joint effusion and mild to moderate synovitis. Labrum:  Labral degenerative changes.  No gross tear. Bones: No acute bony findings. Subchondral cystic change and possible intraosseous ganglion in the humeral head. Other: Moderate subacromial/subdeltoid bursitis. Benign-appearing lipoma is noted near the axillary recess. IMPRESSION: 1. Surgical changes from prior rotator cuff repair and bony decompressive surgery. No findings for recurrent rotator cuff tear. Moderate supraspinatus tendinopathy/tendinosis. 2. Moderate to advanced glenohumeral joint degenerative changes as detailed above. There is a small joint effusion and mild to moderate synovitis. 3. Intact long head biceps tendon. 4. Advanced labral degenerative changes without obvious tear. 5. Moderate subacromial/subdeltoid bursitis. Electronically Signed   By: Marijo Sanes M.D.   On: 10/02/2017 08:32    Assessment & Plan:   Zyan was seen today for follow-up.  Diagnoses and all orders for this visit:  Diabetic ketoacidosis without coma associated with type 2 diabetes mellitus ([place])- Her bicarb is normal now and there are no ketones in her urine.  I think she had a brief episode of ketoacidosis due to noncompliance with the basal insulin.  This has resolved since she has been more compliant with the basal insulin.  She agrees to be more diligent about using  the basal insulin. -     POCT Urinalysis Dipstick (Automated) -     BASIC METABOLIC PANEL WITH GFR; Future -     BASIC METABOLIC PANEL WITH GFR   I am having [name]  Dick maintain her ONE TOUCH ULTRA 2, nitroGLYCERIN, triamcinolone cream, Jardiance, ondansetron, Percocet, multivitamin, vitamin C, Vitamin D3, fluticasone, thiamine, famotidine, metoprolol succinate, potassium chloride SA, telmisartan, spironolactone, NovoFine, furosemide, onetouch ultrasoft, Cool Blood Glucose Test Strips, Victoza, atorvastatin, ALPRAZolam, magnesium oxide, warfarin, gabapentin, metFORMIN, FreeStyle Libre 14 Day Sensor, and [employer] 14 Day Reader.  No orders of the defined types were placed in this encounter.    Follow-up: No follow-ups on file.  Scarlette Calico, MD

## 2019-10-17 ENCOUNTER — Telehealth: Payer: Self-pay | Admitting: Pharmacist

## 2019-10-17 DIAGNOSIS — E1149 Type 2 diabetes mellitus with other diabetic neurological complication: Secondary | ICD-10-CM

## 2019-10-17 DIAGNOSIS — E1151 Type 2 diabetes mellitus with diabetic peripheral angiopathy without gangrene: Secondary | ICD-10-CM

## 2019-10-17 DIAGNOSIS — Z794 Long term (current) use of insulin: Secondary | ICD-10-CM

## 2019-10-17 LAB — BASIC METABOLIC PANEL WITH GFR
BUN: 18 mg/dL (ref 7–25)
CO2: 21 mmol/L (ref 20–32)
Calcium: 10.2 mg/dL (ref 8.6–10.4)
Chloride: 103 mmol/L (ref 98–110)
Creat: 0.9 mg/dL (ref 0.60–0.93)
GFR, Est African American: 75 mL/min/{1.73_m2} (ref >=60)
GFR, Est Non African American: 64 mL/min/{1.73_m2} (ref >=60)
Glucose, Bld: 122 mg/dL — ABNORMAL HIGH (ref 65–99)
Potassium: 4.3 mmol/L (ref 3.5–5.3)
Sodium: 139 mmol/L (ref 135–146)

## 2019-10-17 NOTE — Progress Notes (Signed)
Chronic Care Management Pharmacy Assistant   Name: Kristen [name]  MRN: [id number] DOB: 1945-05-25  Reason for Encounter: Disease State  Patient Questions:  1.  Have you seen any other providers since your last visit? Yes, The patient had an office visit with Dr. Ronnald Ramp on 10/16/2019.  2.  Any changes in your medicines or health? Yes, The patient stated that she was taken off of her Glipizide.    PCP : [name], MD  Allergies:   Allergies  Allergen Reactions  . Metformin And Related Other (See Comments)    Must take XR form only, cannot tolerate Regular release metformin  . Cleocin [Clindamycin Hcl] Diarrhea  . Codeine Itching  . Doxycycline Hyclate Diarrhea  . Macrolides And Ketolides Diarrhea  . Morphine Hives  . Pentazocine Lactate Itching and Nausea And Vomiting    (GENERIC- [place])  . Vibramycin [Doxycycline Calcium] Diarrhea and Rash  . Definity [Perflutren Lipid Microsphere] Other (See Comments)    Patient complained of warm sensation in chest x few seconds duration when injected Definity.No other symptoms  . Sulfonamide Derivatives Rash    Medications: Outpatient Encounter Medications as of 10/17/2019  Medication Sig  . ALPRAZolam (XANAX) 1 MG tablet Take 1 tablet (1 mg total) by mouth 3 (three) times daily as needed for anxiety.  . Ascorbic Acid (VITAMIN C) 1000 MG tablet Take 1,000 mg by mouth daily.  Marland Kitchen atorvastatin (LIPITOR) 40 MG tablet TAKE 1 TABLET(40 MG) BY MOUTH DAILY  . Blood Glucose Monitoring Suppl (ONE TOUCH ULTRA 2) w/Device KIT Use as advised  . Cholecalciferol (VITAMIN D3) 50 MCG (2000 UT) capsule Take 2,000 Units by mouth daily.  . Continuous Blood Gluc Receiver (FREESTYLE LIBRE 14 DAY READER) DEVI 1 Act by Does not apply route daily.  . Continuous Blood Gluc Sensor (FREESTYLE LIBRE 14 DAY SENSOR) MISC 1 Act by Does not apply route daily.  . empagliflozin (JARDIANCE) 25 MG TABS tablet Take 25 mg by mouth daily.  . famotidine (PEPCID) 20  MG tablet Take 1 tablet (20 mg total) by mouth 2 (two) times daily.  . fluticasone (FLONASE) 50 MCG/ACT nasal spray Place 1 spray into both nostrils daily as needed for allergies or rhinitis.  . furosemide (LASIX) 20 MG tablet Take 1 tablet (20 mg total) by mouth daily.  Marland Kitchen gabapentin (NEURONTIN) 300 MG capsule Take 1 capsule (300 mg total) by mouth 3 (three) times daily.  Marland Kitchen glucose blood (COOL BLOOD GLUCOSE TEST STRIPS) test strip Use to test blood sugar twice daily. DX: E11.9  . insulin glargine, 2 Unit Dial, (TOUJEO MAX SOLOSTAR) 300 UNIT/ML Solostar Pen Inject 50 Units into the skin daily.  . Insulin Pen Needle (NOVOFINE) 32G X 6 MM MISC 1 Act by Does not apply route daily.  . Lancets (ONETOUCH ULTRASOFT) lancets Use as instructed  . liraglutide (VICTOZA) 18 MG/3ML SOPN Inject 0.3 mLs (1.8 mg total) into the skin every morning.  . magnesium oxide (MAG-OX) 400 MG tablet TAKE ONE TABLET BY MOUTH BY MOUTH TWICE DAILY WITH BREAKFAST AND EVENING MEAL]  . metFORMIN (GLUCOPHAGE-XR) 500 MG 24 hr tablet Take 2 tablets (1,000 mg total) by mouth 2 (two) times daily with a meal.  . metoprolol succinate (TOPROL-XL) 100 MG 24 hr tablet Take 1 tablet (100 mg total) by mouth 2 (two) times daily. Take with or immediately following a meal.  . Multiple Vitamin (MULTIVITAMIN) tablet Take 1 tablet by mouth daily.  . nitroGLYCERIN (NITROSTAT) 0.4 MG SL tablet  Place 1 tablet (0.4 mg total) under the tongue every 5 (five) minutes as needed for chest pain (x 3 doses). Reported on 05/08/2015  . ondansetron (ZOFRAN-ODT) 4 MG disintegrating tablet Take 1 tablet (4 mg total) by mouth every 8 (eight) hours as needed for nausea or vomiting.  Marland Kitchen PERCOCET 10-325 MG tablet Take 1 tablet by mouth 5 (five) times daily as needed.  . potassium chloride SA (KLOR-CON) 20 MEQ tablet Take 1 tablet (20 mEq total) by mouth 2 (two) times daily.  Marland Kitchen spironolactone (ALDACTONE) 25 MG tablet Take 1 tablet (25 mg total) by mouth 2 (two) times daily.  (Patient taking differently: Take 25 mg by mouth daily. )  . telmisartan (MICARDIS) 40 MG tablet Take 1 tablet (40 mg total) by mouth daily.  Marland Kitchen thiamine 100 MG tablet Take 1 tablet (100 mg total) by mouth daily.  Marland Kitchen triamcinolone cream (KENALOG) 0.5 % Apply 1 application topically 3 (three) times daily.  Marland Kitchen warfarin (COUMADIN) 3 MG tablet TAKE ONE TABLET BY MOUTH DAILY OR AS DIRECTED by anticoagulation clinic  . [DISCONTINUED] atorvastatin (LIPITOR) 40 MG tablet Take 1 tablet (40 mg total) by mouth daily.  . [DISCONTINUED] spironolactone (ALDACTONE) 25 MG tablet Take 1 tablet (25 mg total) by mouth 2 (two) times daily.   No facility-administered encounter medications on file as of 10/17/2019.    Current Diagnosis: Patient Active Problem List   Diagnosis Date Noted  . Thiamine deficiency 12/30/2018  . Vitamin B12 deficiency anemia due to intrinsic factor deficiency 12/20/2018  . CHF (congestive heart failure), NYHA class I, chronic, combined ([place]) 12/19/2018  . Coronary artery disease involving native coronary artery of native heart with angina pectoris ([place]) 12/19/2018  . Obesity with body mass index (BMI) of 30.0 to 39.9 01/15/2018  . Chronic idiopathic constipation 12/27/2017  . Atrial fibrillation ([place]) 10/30/2017  . Type 2 diabetes mellitus ([place]) 10/30/2017  . Eczema 08/07/2017  . Insomnia secondary to chronic pain 08/04/2016  . Visit for screening mammogram 04/14/2016  . LV dysfunction   . Routine general medical examination at a health care facility 05/03/2013  . DM (diabetes mellitus), type 2 with peripheral vascular complications ([place]) 49/20/1007  . PVC's/nonsustained VT 10/24/2012  . H/O: CVA (cerebrovascular accident) 02/09/2012  . Hypothyroidism 05/27/2011  . Fibromyalgia 10/08/2010  . Long term current use of anticoagulant 05/05/2010  . Low back pain, non-specific 05/12/2008  . Cognitive dysfunction associated with depression 01/29/2007  . Osteoarthritis 11/22/2006  .  Hyperlipidemia associated with type 2 diabetes mellitus ([place]) 09/13/2006  . Coronary atherosclerosis 09/13/2006  . Paroxysmal atrial fibrillation ([place]) 09/13/2006  . GERD 09/13/2006  . Hypertension associated with diabetes ([place]) 08/30/2006    Goals Addressed   None     Follow-Up:  [facility]

## 2019-10-18 ENCOUNTER — Other Ambulatory Visit: Payer: Self-pay | Admitting: Internal Medicine

## 2019-10-18 DIAGNOSIS — Z1231 Encounter for screening mammogram for malignant neoplasm of breast: Secondary | ICD-10-CM

## 2019-10-18 MED ORDER — TOUJEO MAX SOLOSTAR 300 UNIT/ML ~~LOC~~ SOPN: 50.0000 [IU] | PEN_INJECTOR | Freq: Every day | SUBCUTANEOUS | 5 refills | Status: DC

## 2019-10-18 NOTE — Addendum Note (Signed)
Addended by: Aviva Signs M on: 10/18/2019 01:19 PM   Modules accepted: Orders

## 2019-10-22 ENCOUNTER — Encounter: Payer: Self-pay | Admitting: Internal Medicine

## 2019-10-22 NOTE — Patient Instructions (Signed)
Type 2 Diabetes Mellitus, Diagnosis, Adult Type 2 diabetes (type 2 diabetes mellitus) is a long-term (chronic) disease. In type 2 diabetes, one or both of these problems may be present:  The pancreas does not make enough of a hormone called insulin.  Cells in the body do not respond properly to insulin that the body makes (insulin resistance). Normally, insulin allows blood sugar (glucose) to enter cells in the body. The cells use glucose for energy. Insulin resistance or lack of insulin causes excess glucose to build up in the blood instead of going into cells. As a result, high blood glucose (hyperglycemia) develops. What increases the risk? The following factors may make you more likely to develop type 2 diabetes:  Having a family member with type 2 diabetes.  Being overweight or obese.  Having an inactive (sedentary) lifestyle.  Having been diagnosed with insulin resistance.  Having a history of prediabetes, gestational diabetes, or polycystic ovary syndrome (PCOS).  Being of American-Indian, African-American, Hispanic/Latino, or Asian/Pacific Islander descent. What are the signs or symptoms? In the early stage of this condition, you may not have symptoms. Symptoms develop slowly and may include:  Increased thirst (polydipsia).  Increased hunger(polyphagia).  Increased urination (polyuria).  Increased urination during the night (nocturia).  Unexplained weight loss.  Frequent infections that keep coming back (recurring).  Fatigue.  Weakness.  Vision changes, such as blurry vision.  Cuts or bruises that are slow to heal.  Tingling or numbness in the hands or feet.  Dark patches on the skin (acanthosis nigricans). How is this diagnosed? This condition is diagnosed based on your symptoms, your medical history, a physical exam, and your blood glucose level. Your blood glucose may be checked with one or more of the following blood tests:  A fasting blood glucose (FBG)  test. You will not be allowed to eat (you will fast) for 8 hours or longer before a blood sample is taken.  A random blood glucose test. This test checks blood glucose at any time of day regardless of when you ate.  An A1c (hemoglobin A1c) blood test. This test provides information about blood glucose control over the previous 2-3 months.  An oral glucose tolerance test (OGTT). This test measures your blood glucose at two times: ? After fasting. This is your baseline blood glucose level. ? Two hours after drinking a beverage that contains glucose. You may be diagnosed with type 2 diabetes if:  Your FBG level is 126 mg/dL (7.0 mmol/L) or higher.  Your random blood glucose level is 200 mg/dL (11.1 mmol/L) or higher.  Your A1c level is 6.5% or higher.  Your OGTT result is higher than 200 mg/dL (11.1 mmol/L). These blood tests may be repeated to confirm your diagnosis. How is this treated? Your treatment may be managed by a specialist called an endocrinologist. Type 2 diabetes may be treated by following instructions from your health care provider about:  Making diet and lifestyle changes. This may include: ? Following an individualized nutrition plan that is developed by a diet and nutrition specialist (registered dietitian). ? Exercising regularly. ? Finding ways to manage stress.  Checking your blood glucose level as often as told.  Taking diabetes medicines or insulin daily. This helps to keep your blood glucose levels in the healthy range. ? If you use insulin, you may need to adjust the dosage depending on how physically active you are and what foods you eat. Your health care provider will tell you how to adjust your dosage.    Taking medicines to help prevent complications from diabetes, such as: ? Aspirin. ? Medicine to lower cholesterol. ? Medicine to control blood pressure. Your health care provider will set individualized treatment goals for you. Your goals will be based on  your age, other medical conditions you have, and how you respond to diabetes treatment. Generally, the goal of treatment is to maintain the following blood glucose levels:  Before meals (preprandial): 80-130 mg/dL (4.4-7.2 mmol/L).  After meals (postprandial): below 180 mg/dL (10 mmol/L).  A1c level: less than 7%. Follow these instructions at home: Questions to ask your health care provider  Consider asking the following questions: ? Do I need to meet with a diabetes educator? ? Where can I find a support group for people with diabetes? ? What equipment will I need to manage my diabetes at home? ? What diabetes medicines do I need, and when should I take them? ? How often do I need to check my blood glucose? ? What number can I call if I have questions? ? When is my next appointment? General instructions  Take over-the-counter and prescription medicines only as told by your health care provider.  Keep all follow-up visits as told by your health care provider. This is important.  For more information about diabetes, visit: ? American Diabetes Association (ADA): www.diabetes.org ? American Association of Diabetes Educators (AADE): www.diabeteseducator.org Contact a health care provider if:  Your blood glucose is at or above 240 mg/dL (13.3 mmol/L) for 2 days in a row.  You have been sick or have had a fever for 2 days or longer, and you are not getting better.  You have any of the following problems for more than 6 hours: ? You cannot eat or drink. ? You have nausea and vomiting. ? You have diarrhea. Get help right away if:  Your blood glucose is lower than 54 mg/dL (3.0 mmol/L).  You become confused or you have trouble thinking clearly.  You have difficulty breathing.  You have moderate or large ketone levels in your urine. Summary  Type 2 diabetes (type 2 diabetes mellitus) is a long-term (chronic) disease. In type 2 diabetes, the pancreas does not make enough of a  hormone called insulin, or cells in the body do not respond properly to insulin that the body makes (insulin resistance).  This condition is treated by making diet and lifestyle changes and taking diabetes medicines or insulin.  Your health care provider will set individualized treatment goals for you. Your goals will be based on your age, other medical conditions you have, and how you respond to diabetes treatment.  Keep all follow-up visits as told by your health care provider. This is important. This information is not intended to replace advice given to you by your health care provider. Make sure you discuss any questions you have with your health care provider. Document Revised: 04/21/2017 Document Reviewed: 03/27/2015 Elsevier Patient Education  2020 [employer]  

## 2019-10-24 MED ORDER — CYANOCOBALAMIN 1000 MCG/ML IJ SOLN
1000.0000 ug | INTRAMUSCULAR | Status: DC
  Administered 2019-10-31 – 2019-12-10 (×2): 1000 ug via INTRAMUSCULAR

## 2019-10-24 NOTE — Addendum Note (Signed)
Addended by: Karle Barr on: 10/24/2019 04:45 PM   Modules accepted: Orders

## 2019-10-29 ENCOUNTER — Other Ambulatory Visit: Payer: Self-pay | Admitting: *Deleted

## 2019-10-29 ENCOUNTER — Encounter: Payer: Self-pay | Admitting: *Deleted

## 2019-10-29 NOTE — Patient Outreach (Signed)
[facility]) Care Management  [place]  10/29/2019   Kristen [name] Jul 16, 1945 [id number]   RN Health Coach Monthly Outreach   Referral Date:  01/02/2018 Referral Source:  MD Referral Screening Reason for Referral:  Disease Management Education Insurance:  [employer] Medicare   Outreach Attempt:  Successful telephone outreach to patient for follow up.  HIPAA verified with patient.  Patient reporting she has obtained [facility] but has questions and concerns about price and how to use it.  Feels like she needs help increasing the size so she can read it and not sure how to apply it.  Encouraged to take equipment to next appointment and ask nursing staff or pharmacy staff to assist.  Have not been regularly checking blood sugars due to finger being really sore.  Blood sugar this morning 2 hours after eating breakfast was 161.  Patient stating recent fasting ranges have been 130-140's which are more elevated than her normal fasting ranges of 90-100's.  Patient believes this is related to the discontinuation of Glipizide and she would like to resume.  Encouraged patient to discuss this medication with primary care provider or CCM Pharmacist.  Denies any increase in shortness of breath or lower extremity edema.  Weight this morning was 212 pounds (within normal range).  Patient also concerned about having her teeth extracted and her being on anticoagulation.  Encouraged patient to discuss with provider and to schedule dental appointment.  Encounter Medications:  Outpatient Encounter Medications as of 10/29/2019  Medication Sig  . Ascorbic Acid (VITAMIN C) 1000 MG tablet Take 1,000 mg by mouth daily.  Marland Kitchen atorvastatin (LIPITOR) 40 MG tablet TAKE 1 TABLET(40 MG) BY MOUTH DAILY  . Cholecalciferol (VITAMIN D3) 50 MCG (2000 UT) capsule Take 2,000 Units by mouth daily.  . empagliflozin (JARDIANCE) 25 MG TABS tablet Take 25 mg by mouth daily.  . famotidine  (PEPCID) 20 MG tablet Take 1 tablet (20 mg total) by mouth 2 (two) times daily.  . fluticasone (FLONASE) 50 MCG/ACT nasal spray Place 1 spray into both nostrils daily as needed for allergies or rhinitis.  . furosemide (LASIX) 20 MG tablet Take 1 tablet (20 mg total) by mouth daily.  Marland Kitchen gabapentin (NEURONTIN) 300 MG capsule Take 1 capsule (300 mg total) by mouth 3 (three) times daily.  . insulin glargine, 2 Unit Dial, (TOUJEO MAX SOLOSTAR) 300 UNIT/ML Solostar Pen Inject 50 Units into the skin daily.  Marland Kitchen liraglutide (VICTOZA) 18 MG/3ML SOPN Inject 0.3 mLs (1.8 mg total) into the skin every morning.  . magnesium oxide (MAG-OX) 400 MG tablet TAKE ONE TABLET BY MOUTH BY MOUTH TWICE DAILY WITH BREAKFAST AND EVENING MEAL]  . metFORMIN (GLUCOPHAGE-XR) 500 MG 24 hr tablet Take 2 tablets (1,000 mg total) by mouth 2 (two) times daily with a meal.  . metoprolol succinate (TOPROL-XL) 100 MG 24 hr tablet Take 1 tablet (100 mg total) by mouth 2 (two) times daily. Take with or immediately following a meal.  . Multiple Vitamin (MULTIVITAMIN) tablet Take 1 tablet by mouth daily.  Marland Kitchen PERCOCET 10-325 MG tablet Take 1 tablet by mouth 5 (five) times daily as needed.  . potassium chloride SA (KLOR-CON) 20 MEQ tablet Take 1 tablet (20 mEq total) by mouth 2 (two) times daily.  Marland Kitchen spironolactone (ALDACTONE) 25 MG tablet Take 1 tablet (25 mg total) by mouth 2 (two) times daily. (Patient taking differently: Take 25 mg by mouth daily. )  . telmisartan (MICARDIS) 40 MG tablet Take  1 tablet (40 mg total) by mouth daily.  Marland Kitchen thiamine 100 MG tablet Take 1 tablet (100 mg total) by mouth daily.  Marland Kitchen warfarin (COUMADIN) 3 MG tablet TAKE ONE TABLET BY MOUTH DAILY OR AS DIRECTED by anticoagulation clinic  . ALPRAZolam (XANAX) 1 MG tablet Take 1 tablet (1 mg total) by mouth 3 (three) times daily as needed for anxiety.  . Blood Glucose Monitoring Suppl (ONE TOUCH ULTRA 2) w/Device KIT Use as advised  . Continuous Blood Gluc Receiver (FREESTYLE  LIBRE 14 DAY READER) DEVI 1 Act by Does not apply route daily.  . Continuous Blood Gluc Sensor (FREESTYLE LIBRE 14 DAY SENSOR) MISC 1 Act by Does not apply route daily.  Marland Kitchen glucose blood (COOL BLOOD GLUCOSE TEST STRIPS) test strip Use to test blood sugar twice daily. DX: E11.9  . Insulin Pen Needle (NOVOFINE) 32G X 6 MM MISC 1 Act by Does not apply route daily.  . Lancets (ONETOUCH ULTRASOFT) lancets Use as instructed  . nitroGLYCERIN (NITROSTAT) 0.4 MG SL tablet Place 1 tablet (0.4 mg total) under the tongue every 5 (five) minutes as needed for chest pain (x 3 doses). Reported on 05/08/2015  . ondansetron (ZOFRAN-ODT) 4 MG disintegrating tablet Take 1 tablet (4 mg total) by mouth every 8 (eight) hours as needed for nausea or vomiting.  . triamcinolone cream (KENALOG) 0.5 % Apply 1 application topically 3 (three) times daily.  . [DISCONTINUED] atorvastatin (LIPITOR) 40 MG tablet Take 1 tablet (40 mg total) by mouth daily.  . [DISCONTINUED] spironolactone (ALDACTONE) 25 MG tablet Take 1 tablet (25 mg total) by mouth 2 (two) times daily.   Facility-Administered Encounter Medications as of 10/29/2019  Medication  . cyanocobalamin ((VITAMIN B-12)) injection 1,000 mcg    Functional Status:  In your present state of health, do you have any difficulty performing the following activities: 04/24/2019  Hearing? N  Vision? N  Difficulty concentrating or making decisions? N  Walking or climbing stairs? Y  Comment difficulty climbing stairs due to arthritis in knees  Dressing or bathing? N  Doing errands, shopping? N  Preparing Food and eating ? Y  Comment husband assist with cooking  Using the Toilet? N  In the past six months, have you accidently leaked urine? Y  Comment frequency and some incontinence  Do you have problems with loss of bowel control? N  Managing your Medications? N  Managing your Finances? N  Housekeeping or managing your Housekeeping? Y  Comment husband assist with cleaning    Some recent data might be hidden    Fall/Depression Screening: Fall Risk  10/29/2019 07/29/2019 06/28/2019  Falls in the past year? 0 0 0  Number falls in past yr: 0 0 0  Injury with Fall? 0 0 0  Risk for fall due to : Impaired balance/gait;Impaired mobility;Impaired vision;Medication side effect Mental status change;Impaired balance/gait;Impaired mobility;Impaired vision;Medication side effect Impaired balance/gait;Impaired mobility;Medication side effect;Impaired vision  Follow up Falls evaluation completed;Falls prevention discussed;Education provided Education provided;Falls prevention discussed;Falls evaluation completed Falls evaluation completed;Education provided;Falls prevention discussed   PHQ 2/9 Scores 04/24/2019 07/05/2018 02/16/2018 01/02/2018 12/29/2017 08/07/2017 10/12/2016  PHQ - 2 Score 0 1 1 0 0 0 1  PHQ- 9 Score - 10 - - - 2 5   Goals Addressed              This Visit's Progress   .  Patient will report decreasing Hgb A1C by 0.2 points within the next 90 days. (pt-stated)  CARE PLAN ENTRY (see longtitudinal plan of care for additional care plan information)  Objective:  Lab Results  Component Value Date   HGBA1C 8.3 (H) 10/10/2019 .   Lab Results  Component Value Date   CREATININE 0.90 10/16/2019   CREATININE 0.86 10/10/2019   CREATININE 0.86 04/11/2019 .   Marland Kitchen No results found for: EGFR  Current Barriers:  Marland Kitchen Knowledge Deficits related to basic Diabetes pathophysiology and self care/management . Knowledge Deficits related to medications used for management of diabetes . Knowledge Deficits related to self administration of insulin . Knowledge Deficits related to self administration of injectable diabetes medications (Victoza)  Case Manager Clinical Goal(s):  Over the next 90 days, patient will demonstrate improved adherence to prescribed treatment plan for diabetes self care/management as evidenced by: decreasing Hgb A1C by 0.2 points (current  8.3) . Verbalize daily monitoring and recording of CBG within 90 days . Verbalize adherence to ADA/ carb modified diet within the next 90 days . Verbalize adherence to prescribed medication regimen within the next 90 days   Interventions:  . Provided education to patient about basic DM disease process . Reviewed medications with patient and discussed importance of medication adherence . Discussed plans with patient for ongoing care management follow up and provided patient with direct contact information for care management team . Provided patient with written educational materials related to hypo and hyperglycemia and importance of correct treatment . Reviewed scheduled/upcoming provider appointments including: follow up with primary care provider and follow up with CCM Pharmacist . Advised patient, providing education and rationale, to check cbg at least 3 times a day or as provider prescribed and record, calling primary care or pharmacist for findings outside established parameters.   . Referral to pharmacy ; encouraged to contact CCM Pharmacist to discuss medications and Freestyle Libre continuous monitoring system; RN Health Coach will send CCM Pharmacist Ainsley Spinner message to contact patient . Encouraged to make dental appointment and discuss teeth extractions with dentist and primary care provider  Patient Self Care Activities:  . Self administers oral medications as prescribed . Self administers insulin as prescribed . Self administers injectable DM medication (Victoza) as prescribed . Attends all scheduled provider appointments . Checks blood sugars as prescribed and utilize hyper and hypoglycemia protocol as needed . Adheres to prescribed ADA/carb modified . Continue to weigh daily and notify provider based on increased weight . Make dental appointment  Initial goal documentation       Appointments:  Attended appointment with primary care provider, Dr. Ronnald Ramp on  10/16/2019.  Plan: RN Health Coach will send primary care quarterly update. RN Health Coach will send [place] in basket message to call patient. RN Health Coach will make next telephone outreach to patient within the month of September.  [place] [phone] [email or web address]

## 2019-10-30 ENCOUNTER — Telehealth: Payer: Self-pay | Admitting: Pharmacist

## 2019-10-30 NOTE — Progress Notes (Signed)
Chronic Care Management Pharmacy Assistant   Name: Kristen [name]  MRN: [id number] DOB: May 17, 1945  Reason for Encounter: Patient Call   PCP : [name], MD  Allergies:   Allergies  Allergen Reactions  . Metformin And Related Other (See Comments)    Must take XR form only, cannot tolerate Regular release metformin  . Cleocin [Clindamycin Hcl] Diarrhea  . Codeine Itching  . Doxycycline Hyclate Diarrhea  . Macrolides And Ketolides Diarrhea  . Morphine Hives  . Pentazocine Lactate Itching and Nausea And Vomiting    (GENERIC- [place])  . Vibramycin [Doxycycline Calcium] Diarrhea and Rash  . Definity [Perflutren Lipid Microsphere] Other (See Comments)    Patient complained of warm sensation in chest x few seconds duration when injected Definity.No other symptoms  . Sulfonamide Derivatives Rash    Medications: Outpatient Encounter Medications as of 10/30/2019  Medication Sig  . ALPRAZolam (XANAX) 1 MG tablet Take 1 tablet (1 mg total) by mouth 3 (three) times daily as needed for anxiety.  . Ascorbic Acid (VITAMIN C) 1000 MG tablet Take 1,000 mg by mouth daily.  Marland Kitchen atorvastatin (LIPITOR) 40 MG tablet TAKE 1 TABLET(40 MG) BY MOUTH DAILY  . Blood Glucose Monitoring Suppl (ONE TOUCH ULTRA 2) w/Device KIT Use as advised  . Cholecalciferol (VITAMIN D3) 50 MCG (2000 UT) capsule Take 2,000 Units by mouth daily.  . Continuous Blood Gluc Receiver (FREESTYLE LIBRE 14 DAY READER) DEVI 1 Act by Does not apply route daily.  . Continuous Blood Gluc Sensor (FREESTYLE LIBRE 14 DAY SENSOR) MISC 1 Act by Does not apply route daily.  . empagliflozin (JARDIANCE) 25 MG TABS tablet Take 25 mg by mouth daily.  . famotidine (PEPCID) 20 MG tablet Take 1 tablet (20 mg total) by mouth 2 (two) times daily.  . fluticasone (FLONASE) 50 MCG/ACT nasal spray Place 1 spray into both nostrils daily as needed for allergies or rhinitis.  . furosemide (LASIX) 20 MG tablet Take 1 tablet (20 mg total) by  mouth daily.  Marland Kitchen gabapentin (NEURONTIN) 300 MG capsule Take 1 capsule (300 mg total) by mouth 3 (three) times daily.  Marland Kitchen glucose blood (COOL BLOOD GLUCOSE TEST STRIPS) test strip Use to test blood sugar twice daily. DX: E11.9  . insulin glargine, 2 Unit Dial, (TOUJEO MAX SOLOSTAR) 300 UNIT/ML Solostar Pen Inject 50 Units into the skin daily.  . Insulin Pen Needle (NOVOFINE) 32G X 6 MM MISC 1 Act by Does not apply route daily.  . Lancets (ONETOUCH ULTRASOFT) lancets Use as instructed  . liraglutide (VICTOZA) 18 MG/3ML SOPN Inject 0.3 mLs (1.8 mg total) into the skin every morning.  . magnesium oxide (MAG-OX) 400 MG tablet TAKE ONE TABLET BY MOUTH BY MOUTH TWICE DAILY WITH BREAKFAST AND EVENING MEAL]  . metFORMIN (GLUCOPHAGE-XR) 500 MG 24 hr tablet Take 2 tablets (1,000 mg total) by mouth 2 (two) times daily with a meal.  . metoprolol succinate (TOPROL-XL) 100 MG 24 hr tablet Take 1 tablet (100 mg total) by mouth 2 (two) times daily. Take with or immediately following a meal.  . Multiple Vitamin (MULTIVITAMIN) tablet Take 1 tablet by mouth daily.  . nitroGLYCERIN (NITROSTAT) 0.4 MG SL tablet Place 1 tablet (0.4 mg total) under the tongue every 5 (five) minutes as needed for chest pain (x 3 doses). Reported on 05/08/2015  . ondansetron (ZOFRAN-ODT) 4 MG disintegrating tablet Take 1 tablet (4 mg total) by mouth every 8 (eight) hours as needed for nausea or vomiting.  Marland Kitchen  PERCOCET 10-325 MG tablet Take 1 tablet by mouth 5 (five) times daily as needed.  . potassium chloride SA (KLOR-CON) 20 MEQ tablet Take 1 tablet (20 mEq total) by mouth 2 (two) times daily.  Marland Kitchen spironolactone (ALDACTONE) 25 MG tablet Take 1 tablet (25 mg total) by mouth 2 (two) times daily. (Patient taking differently: Take 25 mg by mouth daily. )  . telmisartan (MICARDIS) 40 MG tablet Take 1 tablet (40 mg total) by mouth daily.  Marland Kitchen thiamine 100 MG tablet Take 1 tablet (100 mg total) by mouth daily.  Marland Kitchen triamcinolone cream (KENALOG) 0.5 % Apply  1 application topically 3 (three) times daily.  Marland Kitchen warfarin (COUMADIN) 3 MG tablet TAKE ONE TABLET BY MOUTH DAILY OR AS DIRECTED by anticoagulation clinic  . [DISCONTINUED] atorvastatin (LIPITOR) 40 MG tablet Take 1 tablet (40 mg total) by mouth daily.  . [DISCONTINUED] spironolactone (ALDACTONE) 25 MG tablet Take 1 tablet (25 mg total) by mouth 2 (two) times daily.   Facility-Administered Encounter Medications as of 10/30/2019  Medication  . cyanocobalamin ((VITAMIN B-12)) injection 1,000 mcg    Current Diagnosis: Patient Active Problem List   Diagnosis Date Noted  . Thiamine deficiency 12/30/2018  . Vitamin B12 deficiency anemia due to intrinsic factor deficiency 12/20/2018  . CHF (congestive heart failure), NYHA class I, chronic, combined ([place]) 12/19/2018  . Coronary artery disease involving native coronary artery of native heart with angina pectoris ([place]) 12/19/2018  . Obesity with body mass index (BMI) of 30.0 to 39.9 01/15/2018  . Chronic idiopathic constipation 12/27/2017  . Atrial fibrillation ([place]) 10/30/2017  . Type 2 diabetes mellitus ([place]) 10/30/2017  . Eczema 08/07/2017  . Insomnia secondary to chronic pain 08/04/2016  . Visit for screening mammogram 04/14/2016  . LV dysfunction   . Routine general medical examination at a health care facility 05/03/2013  . DM (diabetes mellitus), type 2 with peripheral vascular complications ([place]) 19/37/9024  . PVC's/nonsustained VT 10/24/2012  . H/O: CVA (cerebrovascular accident) 02/09/2012  . Hypothyroidism 05/27/2011  . Fibromyalgia 10/08/2010  . Long term current use of anticoagulant 05/05/2010  . Low back pain, non-specific 05/12/2008  . Cognitive dysfunction associated with depression 01/29/2007  . Osteoarthritis 11/22/2006  . Hyperlipidemia associated with type 2 diabetes mellitus ([place]) 09/13/2006  . Coronary atherosclerosis 09/13/2006  . Paroxysmal atrial fibrillation ([place]) 09/13/2006  . GERD 09/13/2006  . Hypertension  associated with diabetes ([place]) 08/30/2006    Goals Addressed   None     Follow-Up:  Pharmacist Review    Per Clinical pharmacist I was instructed to call the patient to see if she is able to come into the office the tomorrow to discuss her [facility]. The patient stated that she will be able to go to the office at 130pm. Will forward information to clinical pharmacist.    Rosendo Gros, [place] Pharmacist Assistant  [phone]

## 2019-10-31 ENCOUNTER — Ambulatory Visit: Payer: Medicare Other | Admitting: Pharmacist

## 2019-10-31 ENCOUNTER — Other Ambulatory Visit: Payer: Self-pay

## 2019-10-31 ENCOUNTER — Other Ambulatory Visit: Payer: Self-pay | Admitting: General Practice

## 2019-10-31 ENCOUNTER — Ambulatory Visit (INDEPENDENT_AMBULATORY_CARE_PROVIDER_SITE_OTHER): Payer: Medicare Other | Admitting: General Practice

## 2019-10-31 ENCOUNTER — Ambulatory Visit: Payer: Medicare Other

## 2019-10-31 DIAGNOSIS — I480 Paroxysmal atrial fibrillation: Secondary | ICD-10-CM

## 2019-10-31 DIAGNOSIS — D510 Vitamin B12 deficiency anemia due to intrinsic factor deficiency: Secondary | ICD-10-CM | POA: Diagnosis not present

## 2019-10-31 DIAGNOSIS — E785 Hyperlipidemia, unspecified: Secondary | ICD-10-CM

## 2019-10-31 DIAGNOSIS — Z7901 Long term (current) use of anticoagulants: Secondary | ICD-10-CM | POA: Diagnosis not present

## 2019-10-31 DIAGNOSIS — E1151 Type 2 diabetes mellitus with diabetic peripheral angiopathy without gangrene: Secondary | ICD-10-CM

## 2019-10-31 LAB — POCT INR: INR: 2.4 (ref 2.0–3.0)

## 2019-10-31 NOTE — Patient Instructions (Addendum)
Visit Information  Phone number for Pharmacist: [phone]  Goals Addressed            This Visit's Progress   . Pharmacy Care Plan       CARE PLAN ENTRY  Current Barriers:  . Chronic Disease Management support, education, and care coordination needs related to Hypertension, Hyperlipidemia, Diabetes, Atrial Fibrillation, and Heart Failure   Hypertension/Heart Failure BP Readings from Last 3 Encounters:  05/08/19 124/76  05/03/19 126/76  04/18/19 122/78 .  Pharmacist Clinical Goal(s): o Over the next 90 days, patient will work with PharmD and providers to maintain BP goal <130/80 . Current regimen:  o metoprolol succinate 100 mg twice daily  o telmisartan 40 mg daily,  o spironolactone 25 mg daily,  o furosemide 20 mg daily,  o potassium chloride 20 meq twice daily o Nitroglycerin 0.4 mg as needed . Interventions: o Discussed medications at length and how they work together to achieve BP goals o Discussed benefits of maintaining BP at goal . Patient self care activities - Over the next 90 days, patient will: o Ensure daily salt intake < 2300 mg/day  Hyperlipidemia Lab Results  Component Value Date/Time   LDLCALC 65 04/11/2019 02:55 PM   [place] 48 12/26/2017 07:58 AM   LDLDIRECT 116.0 11/28/2008 11:22 AM .  Pharmacist Clinical Goal(s): o Over the next 90 days, patient will work with PharmD and providers to maintain LDL goal < 70 . Current regimen:  o Atorvastatin 40 mg daily . Interventions: o Discussed dietary recommendations to maintain LDL goals, including cutting back on fried foods . Patient self care activities - Over the next 90 days, patient will: o Reduce fried fish in diet  Diabetes Lab Results  Component Value Date/Time   HGBA1C 9.0 (A) 04/11/2019 02:39 PM   HGBA1C 9.0 (A) 12/19/2018 04:44 PM   HGBA1C 8.4 (H) 08/07/2017 02:52 PM   HGBA1C 8.0 (H) 05/25/2017 02:36 PM .  Pharmacist Clinical Goal(s): o Over the next 90 days, patient will work with  PharmD and providers to achieve A1c goal <8% . Current regimen:  o Jardiance 25 mg daily (via BI Cares) o metformin ER 1000 mg twice daily o Toujeo Max 50 units daily (via [employer]of[employer]) o Victoza 1.8 mg daily (via [employer]) o [employer] 14-day device . Interventions: o Patient assistance approved for all brand-name medications o Discussed risk for hypoglycemia with insulin and glipizide o Discussed benefits of decreasing carbs in diet o Counseled how to apply and operate Freestyle Libre CGM . Patient self care activities - Over the next 90 days, patient will: o Check blood sugar twice daily, in the morning before eating or drinking, and at bedtime, document, and provide at future appointments o Contact provider with any episodes of hypoglycemia o Reduce carbohydrates - bread, pasta, rice, potatoes  Atrial Fibrillation . Pharmacist Clinical Goal(s) o Over the next 90 days, patient will work with PharmD and providers to optimize anticoagulation regimen . Current regimen:  o metoprolol succinate 100 mg BID,  o warfarin 3 mg AD . Interventions: o Discussed Lovenox bridge before shoulder surgery (TBD) - Lovenox is non-preferred drug with insurance but during coverage gap patient receives 75% discount on generic drugs, bringing price to $30-40 . Patient self care activities - Over the next 90 days, patient will: o Continue to follow with warfarin clinic for INR checks and dose changes o Follow surgical and warfarin clinic nurse instructions regarding Lovenox bridging for possible shoulder surgery o Inform pharmacist and  providers of medication or diet changes o Contact pharmacist with any medication access issues  Medication management . Pharmacist Clinical Goal(s): o Over the next 90 days, patient will work with PharmD and providers to maintain optimal medication adherence . Current pharmacy: [facility] . Interventions o Comprehensive medication review performed. o Utilize [facility]  pharmacy for medication synchronization, packaging and delivery . Patient self care activities - Over the next 90 days, patient will: o Focus on medication adherence by pill box o Take medications as prescribed o Report any questions or concerns to PharmD and/or provider(s)  Please see past updates related to this goal by clicking on the "Past Updates" button in the selected goal        Patient verbalizes understanding of instructions provided today.  Telephone follow up appointment with pharmacy team member scheduled for: 2 months  Charlene Brooke, PharmD, BCACP Clinical Pharmacist [facility] [phone]  Preventing Hypoglycemia Hypoglycemia occurs when the level of sugar (glucose) in the blood is too low. Hypoglycemia can happen in people who do or do not have diabetes (diabetes mellitus). It can develop quickly, and it can be a medical emergency. For most people with diabetes, a blood glucose level below 70 mg/dL (3.9 mmol/L) is considered hypoglycemia. Glucose is a type of sugar that provides the body's main source of energy. Certain hormones (insulin and glucagon) control the level of glucose in the blood. Insulin lowers blood glucose, and glucagon increases blood glucose. Hypoglycemia can result from having too much insulin in the bloodstream, or from not eating enough food that contains glucose. Your risk for hypoglycemia is higher:  If you take insulin or diabetes medicines to help lower your blood glucose or help your body make more insulin.  If you skip or delay a meal or snack.  If you are ill.  During and after exercise. You can prevent hypoglycemia by working with your health care provider to adjust your meal plan as needed and by taking other precautions. How can hypoglycemia affect me? Mild symptoms Mild hypoglycemia may not cause any symptoms. If you do have symptoms, they may include:  Hunger.  Anxiety.  Sweating and feeling  clammy.  Dizziness or feeling light-headed.  Sleepiness.  Nausea.  Increased heart rate.  Headache.  Blurry vision.  Irritability.  Tingling or numbness around the mouth, lips, or tongue.  A change in coordination.  Restless sleep. If mild hypoglycemia is not recognized and treated, it can quickly become moderate or severe hypoglycemia. Moderate symptoms Moderate hypoglycemia can cause:  Mental confusion and poor judgment.  Behavior changes.  Weakness.  Irregular heartbeat. Severe symptoms Severe hypoglycemia is a medical emergency. It can cause:  Fainting.  Seizures.  Loss of consciousness (coma).  Death. What nutrition changes can be made?  Work with your health care provider or diet and nutrition specialist (dietitian) to make a healthy meal plan that is right for you. Follow your meal plan carefully.  Eat meals at regular times.  If recommended by your health care provider, have snacks between meals.  Donot skip or delay meals or snacks. You can be at risk for hypoglycemia if you are not getting enough carbohydrates. What lifestyle changes can be made?   Work closely with your health care provider to manage your blood glucose. Make sure you know: ? Your goal blood glucose levels. ? How and when to check your blood glucose. ? The symptoms of hypoglycemia. It is important to treat it right away to keep  it from becoming severe.  Do not drink alcohol on an empty stomach.  When you are ill, check your blood glucose more often than usual. Follow your sick day plan whenever you cannot eat or drink normally. Make this plan in advance with your health care provider.  Always check your blood glucose before, during, and after exercise. How is this treated? This condition can often be treated by immediately eating or drinking something that contains sugar, such as:  Fruit juice, 4-6 oz (120-150 mL).  Regular (not diet) soda, 4-6 oz (120-150 mL).  Low-fat  milk, 4 oz (120 mL).  Several pieces of hard candy.  Sugar or honey, 1 Tbsp (15 mL). Treating hypoglycemia if you have diabetes If you are alert and able to swallow safely, follow the 15:15 rule:  Take 15 grams of a rapid-acting carbohydrate. Talk with your health care provider about how much you should take.  Rapid-acting options include: ? Glucose pills (take 15 grams). ? 6-8 pieces of hard candy. ? 4-6 oz (120-150 mL) of fruit juice. ? 4-6 oz (120-150 mL) of regular (not diet) soda.  Check your blood glucose 15 minutes after you take the carbohydrate.  If the repeat blood glucose level is still at or below 70 mg/dL (3.9 mmol/L), take 15 grams of a carbohydrate again.  If your blood glucose level does not increase above 70 mg/dL (3.9 mmol/L) after 3 tries, seek emergency medical care.  After your blood glucose level returns to normal, eat a meal or a snack within 1 hour. Treating severe hypoglycemia Severe hypoglycemia is when your blood glucose level is at or below 54 mg/dL (3 mmol/L). Severe hypoglycemia is a medical emergency. Get medical help right away. If you have severe hypoglycemia and you cannot eat or drink, you may need an injection of glucagon. A family member or close friend should learn how to check your blood glucose and how to give you a glucagon injection. Ask your health care provider if you need to have an emergency glucagon injection kit available. Severe hypoglycemia may need to be treated in a hospital. The treatment may include getting glucose through an IV. You may also need treatment for the cause of your hypoglycemia. Where to find more information  American Diabetes Association: www.diabetes.[employer] of Diabetes and Digestive and Kidney Diseases: [employer] Contact a health care provider if:  You have problems keeping your blood glucose in your target range.  You have frequent episodes of hypoglycemia. Get help right away  if:  You continue to have hypoglycemia symptoms after eating or drinking something containing glucose.  Your blood glucose level is at or below 54 mg/dL (3 mmol/L).  You faint.  You have a seizure. These symptoms may represent a serious problem that is an emergency. Do not wait to see if the symptoms will go away. Get medical help right away. Call your local emergency services (911 in the [place] Summary  Know the symptoms of hypoglycemia, and when you are at risk for it (such as during exercise or when you are sick). Check your blood glucose often when you are at risk for hypoglycemia.  Hypoglycemia can develop quickly, and it can be dangerous if it is not treated right away. If you have a history of severe hypoglycemia, make sure you know how to use your glucagon injection kit.  Make sure you know how to treat hypoglycemia. Keep a carbohydrate snack available when you may be at risk for hypoglycemia. This information  is not intended to replace advice given to you by your health care provider. Make sure you discuss any questions you have with your health care provider. Document Revised: 06/15/2018 Document Reviewed: 10/19/2016 Elsevier Patient Education  [place].

## 2019-10-31 NOTE — Progress Notes (Signed)
I have reviewed and agree.

## 2019-10-31 NOTE — Patient Instructions (Addendum)
Pre visit review using our clinic review tool, if applicable. No additional management support is needed unless otherwise documented below in the visit note.  Continue to take 1 tablet daily except 2 tablets on Mon and Fridays. Re-check in 6 weeks.  

## 2019-10-31 NOTE — Chronic Care Management (AMB) (Signed)
 Chronic Care Management Pharmacy  Name: Kristen [name]  MRN: [id number] DOB: 04/10/1945    Chief Complaint/ HPI  Kristen [name],  74 y.o. , female presents for their Follow-Up CCM visit with the clinical pharmacist via telephone due to COVID-19 Pandemic. She presents for Freestyle Libre 14-day initiation and counseling.  PCP : Jones, Thomas L, MD  Patient Care Team: Jones, Thomas L, MD as PCP - General (Internal Medicine) Cooper, Michael, MD as PCP - Cardiology (Cardiology) Klein, Steven C, MD as Consulting Physician (Cardiology) Tarpley, Farrah D, RN as [facility] Care Management ,  N, RPH as Pharmacist (Pharmacist)  Their chronic conditions include: T2DM, HTN, Afib, CHF, CAD, GERD, HLD, fibromyalgia, insomnia  Allergies  Allergen Reactions  . Metformin And Related Other (See Comments)    Must take XR form only, cannot tolerate Regular release metformin  . Cleocin [Clindamycin Hcl] Diarrhea  . Codeine Itching  . Doxycycline Hyclate Diarrhea  . Macrolides And Ketolides Diarrhea  . Morphine Hives  . Pentazocine Lactate Itching and Nausea And Vomiting    (GENERIC- [place])  . Vibramycin [Doxycycline Calcium] Diarrhea and Rash  . Definity [Perflutren Lipid Microsphere] Other (See Comments)    Patient complained of warm sensation in chest x few seconds duration when injected Definity.No other symptoms  . Sulfonamide Derivatives Rash   Medications: Outpatient Encounter Medications as of 10/31/2019  Medication Sig  . ALPRAZolam (XANAX) 1 MG tablet Take 1 tablet (1 mg total) by mouth 3 (three) times daily as needed for anxiety.  . Ascorbic Acid (VITAMIN C) 1000 MG tablet Take 1,000 mg by mouth daily.  . atorvastatin (LIPITOR) 40 MG tablet TAKE 1 TABLET(40 MG) BY MOUTH DAILY  . Blood Glucose Monitoring Suppl (ONE TOUCH ULTRA 2) w/Device KIT Use as advised  . Cholecalciferol (VITAMIN D3) 50 MCG (2000 UT) capsule Take 2,000 Units by mouth  daily.  . Continuous Blood Gluc Receiver (FREESTYLE LIBRE 14 DAY READER) DEVI 1 Act by Does not apply route daily.  . Continuous Blood Gluc Sensor (FREESTYLE LIBRE 14 DAY SENSOR) MISC 1 Act by Does not apply route daily.  . empagliflozin (JARDIANCE) 25 MG TABS tablet Take 25 mg by mouth daily.  . famotidine (PEPCID) 20 MG tablet Take 1 tablet (20 mg total) by mouth 2 (two) times daily.  . fluticasone (FLONASE) 50 MCG/ACT nasal spray Place 1 spray into both nostrils daily as needed for allergies or rhinitis.  . furosemide (LASIX) 20 MG tablet Take 1 tablet (20 mg total) by mouth daily.  . gabapentin (NEURONTIN) 300 MG capsule Take 1 capsule (300 mg total) by mouth 3 (three) times daily.  . glucose blood (COOL BLOOD GLUCOSE TEST STRIPS) test strip Use to test blood sugar twice daily. DX: E11.9  . insulin glargine, 2 Unit Dial, (TOUJEO MAX SOLOSTAR) 300 UNIT/ML Solostar Pen Inject 50 Units into the skin daily.  . Insulin Pen Needle (NOVOFINE) 32G X 6 MM MISC 1 Act by Does not apply route daily.  . Lancets (ONETOUCH ULTRASOFT) lancets Use as instructed  . liraglutide (VICTOZA) 18 MG/3ML SOPN Inject 0.3 mLs (1.8 mg total) into the skin every morning.  . magnesium oxide (MAG-OX) 400 MG tablet TAKE ONE TABLET BY MOUTH BY MOUTH TWICE DAILY WITH BREAKFAST AND EVENING MEAL]  . metFORMIN (GLUCOPHAGE-XR) 500 MG 24 hr tablet Take 2 tablets (1,000 mg total) by mouth 2 (two) times daily with a meal.  . metoprolol succinate (TOPROL-XL) 100 MG 24 hr tablet Take 1 tablet (  100 mg total) by mouth 2 (two) times daily. Take with or immediately following a meal.  . Multiple Vitamin (MULTIVITAMIN) tablet Take 1 tablet by mouth daily.  . nitroGLYCERIN (NITROSTAT) 0.4 MG SL tablet Place 1 tablet (0.4 mg total) under the tongue every 5 (five) minutes as needed for chest pain (x 3 doses). Reported on 05/08/2015  . ondansetron (ZOFRAN-ODT) 4 MG disintegrating tablet Take 1 tablet (4 mg total) by mouth every 8 (eight) hours as  needed for nausea or vomiting.  . PERCOCET 10-325 MG tablet Take 1 tablet by mouth 5 (five) times daily as needed.  . potassium chloride SA (KLOR-CON) 20 MEQ tablet Take 1 tablet (20 mEq total) by mouth 2 (two) times daily.  . spironolactone (ALDACTONE) 25 MG tablet Take 1 tablet (25 mg total) by mouth 2 (two) times daily. (Patient taking differently: Take 25 mg by mouth daily. )  . telmisartan (MICARDIS) 40 MG tablet Take 1 tablet (40 mg total) by mouth daily.  . thiamine 100 MG tablet Take 1 tablet (100 mg total) by mouth daily.  . triamcinolone cream (KENALOG) 0.5 % Apply 1 application topically 3 (three) times daily.  . warfarin (COUMADIN) 3 MG tablet TAKE ONE TABLET BY MOUTH DAILY OR AS DIRECTED by anticoagulation clinic  . [DISCONTINUED] atorvastatin (LIPITOR) 40 MG tablet Take 1 tablet (40 mg total) by mouth daily.  . [DISCONTINUED] spironolactone (ALDACTONE) 25 MG tablet Take 1 tablet (25 mg total) by mouth 2 (two) times daily.   Facility-Administered Encounter Medications as of 10/31/2019  Medication  . cyanocobalamin ((VITAMIN B-12)) injection 1,000 mcg    Current Diagnosis/Assessment:   Goals Addressed            This Visit's Progress   . Pharmacy Care Plan       CARE PLAN ENTRY  Current Barriers:  . Chronic Disease Management support, education, and care coordination needs related to Hypertension, Hyperlipidemia, Diabetes, Atrial Fibrillation, and Heart Failure   Hypertension/Heart Failure BP Readings from Last 3 Encounters:  05/08/19 124/76  05/03/19 126/76  04/18/19 122/78 .  Pharmacist Clinical Goal(s): o Over the next 90 days, patient will work with PharmD and providers to maintain BP goal <130/80 . Current regimen:  o metoprolol succinate 100 mg twice daily  o telmisartan 40 mg daily,  o spironolactone 25 mg daily,  o furosemide 20 mg daily,  o potassium chloride 20 meq twice daily o Nitroglycerin 0.4 mg as needed . Interventions: o Discussed medications  at length and how they work together to achieve BP goals o Discussed benefits of maintaining BP at goal . Patient self care activities - Over the next 90 days, patient will: o Ensure daily salt intake < 2300 mg/day  Hyperlipidemia Lab Results  Component Value Date/Time   LDLCALC 65 04/11/2019 02:55 PM   LDLCALC 48 12/26/2017 07:58 AM   LDLDIRECT 116.0 11/28/2008 11:22 AM .  Pharmacist Clinical Goal(s): o Over the next 90 days, patient will work with PharmD and providers to maintain LDL goal < 70 . Current regimen:  o Atorvastatin 40 mg daily . Interventions: o Discussed dietary recommendations to maintain LDL goals, including cutting back on fried foods . Patient self care activities - Over the next 90 days, patient will: o Reduce fried fish in diet  Diabetes Lab Results  Component Value Date/Time   HGBA1C 9.0 (A) 04/11/2019 02:39 PM   HGBA1C 9.0 (A) 12/19/2018 04:44 PM   HGBA1C 8.4 (H) 08/07/2017 02:52 PM   HGBA1C 8.0 (  H) 05/25/2017 02:36 PM .  Pharmacist Clinical Goal(s): o Over the next 90 days, patient will work with PharmD and providers to achieve A1c goal <8% . Current regimen:  o Jardiance 25 mg daily (via [employer]) o metformin ER 1000 mg twice daily o Toujeo Max 50 units daily (via Sanofi) o Victoza 1.8 mg daily (via [employer]) o [employer] 14-day device . Interventions: o Patient assistance approved for all brand-name medications o Discussed risk for hypoglycemia with insulin and glipizide o Discussed benefits of decreasing carbs in diet o Counseled how to apply and operate Freestyle Libre CGM . Patient self care activities - Over the next 90 days, patient will: o Check blood sugar twice daily, in the morning before eating or drinking, and at bedtime, document, and provide at future appointments o Contact provider with any episodes of hypoglycemia o Reduce carbohydrates - bread, pasta, rice, potatoes  Atrial Fibrillation . Pharmacist Clinical  Goal(s) o Over the next 90 days, patient will work with PharmD and providers to optimize anticoagulation regimen . Current regimen:  o metoprolol succinate 100 mg BID,  o warfarin 3 mg AD . Interventions: o Discussed Lovenox bridge before shoulder surgery (TBD) - Lovenox is non-preferred drug with insurance but during coverage gap patient receives 75% discount on generic drugs, bringing price to $30-40 . Patient self care activities - Over the next 90 days, patient will: o Continue to follow with warfarin clinic for INR checks and dose changes o Follow surgical and warfarin clinic nurse instructions regarding Lovenox bridging for possible shoulder surgery o Inform pharmacist and providers of medication or diet changes o Contact pharmacist with any medication access issues  Medication management . Pharmacist Clinical Goal(s): o Over the next 90 days, patient will work with PharmD and providers to maintain optimal medication adherence . Current pharmacy: [facility] . Interventions o Comprehensive medication review performed. o Utilize [facility] pharmacy for medication synchronization, packaging and delivery . Patient self care activities - Over the next 90 days, patient will: o Focus on medication adherence by pill box o Take medications as prescribed o Report any questions or concerns to PharmD and/or provider(s)  Please see past updates related to this goal by clicking on the "Past Updates" button in the selected goal         Diabetes   A1c goal < 7%  Recent Relevant Labs: Lab Results  Component Value Date/Time   HGBA1C 8.3 (H) 10/10/2019 03:46 PM   HGBA1C 9.0 (A) 04/11/2019 02:39 PM   HGBA1C 9.0 (A) 12/19/2018 04:44 PM   HGBA1C 8.4 (H) 08/07/2017 02:52 PM   MICROALBUR <0.7 04/11/2019 02:55 PM   MICROALBUR <0.7 12/28/2017 03:51 PM   Last diabetic Foot exam:  Lab Results  Component Value Date/Time   HMDIABEYEEXA No Retinopathy 03/21/2019 12:00 AM    Last diabetic Eye  exam:  Lab Results  Component Value Date/Time   HMDIABFOOTEX normal 04/02/2014 12:00 AM    Checking BG: 2x per Day  Recent FBG Readings: 104-160 Recent 2hr PP BG readings: 100  Patient has failed these meds in past: glipizide (stopped d/t lows) Patient is currently uncontrolled on the following medications:   Jardiance 25 mg daily ([employer]ares PAP)  metformin ER 1000 mg BID,   Toujeo Max 50 units daily PM (Sanofi PAP)  Victoza 1.8 mg daily AM ([employer] PAP)  Freestyle Libre 14-day CGM  We discussed: [employer] counseling - how to apply sensor, set up LibreLink app to track glucose; scan  every 8 hours for consistent glucose data; change sensor after 14 days. Pt applied sensor herself during visit and voiced understanding of how to use Libre App on her phone to scan glucose.  Plan  Continue current medications and control with diet and exercise    Follow up: 2 month phone visit   , PharmD, BCACP Clinical Pharmacist [facility] [place] [phone]  

## 2019-11-04 DIAGNOSIS — M150 Primary generalized (osteo)arthritis: Secondary | ICD-10-CM | POA: Diagnosis not present

## 2019-11-04 DIAGNOSIS — Z79891 Long term (current) use of opiate analgesic: Secondary | ICD-10-CM | POA: Diagnosis not present

## 2019-11-04 DIAGNOSIS — M47816 Spondylosis without myelopathy or radiculopathy, lumbar region: Secondary | ICD-10-CM | POA: Diagnosis not present

## 2019-11-04 DIAGNOSIS — G894 Chronic pain syndrome: Secondary | ICD-10-CM | POA: Diagnosis not present

## 2019-11-07 ENCOUNTER — Telehealth: Payer: Self-pay

## 2019-11-07 NOTE — Telephone Encounter (Signed)
Pt notified that patient assistance program medication Toujeo is here for pick up.  Pt states she will send her husband to pick it up.  Instructions for pick up given.

## 2019-11-13 ENCOUNTER — Ambulatory Visit: Payer: Medicare Other

## 2019-11-13 ENCOUNTER — Ambulatory Visit (INDEPENDENT_AMBULATORY_CARE_PROVIDER_SITE_OTHER): Payer: Medicare Other

## 2019-11-13 DIAGNOSIS — Z0000 Encounter for general adult medical examination without abnormal findings: Secondary | ICD-10-CM

## 2019-11-13 NOTE — Progress Notes (Signed)
I connected with Kristen [name] today by telephone and verified that I am speaking with the correct person using two identifiers. Location patient: home Location provider: work Persons participating in the virtual visit: Kristen [name] and Kristen Abu, LPN   I discussed the limitations, risks, security and privacy concerns of performing an evaluation and management service by telephone and the availability of in person appointments. I also discussed with the patient that there may be a patient responsible charge related to this service. The patient expressed understanding and verbally consented to this telephonic visit.    Interactive audio and video telecommunications were attempted between this provider and patient, however failed, due to patient having technical difficulties OR patient did not have access to video capability.  We continued and completed visit with audio only.  Some vital signs may be absent or patient reported.   Time Spent with patient on telephone encounter: 45 minutes  Subjective:   Kristen [name] is a 74 y.o. female who presents for Medicare Annual (Subsequent) preventive examination.  Review of Systems    No ROS. Medicare Wellness Visit Cardiac Risk Factors include: advanced age (>60men, >56 women);diabetes mellitus;dyslipidemia;family history of premature cardiovascular disease;hypertension;obesity (BMI >30kg/m2)     Objective:    There were no vitals filed for this visit. There is no height or weight on file to calculate BMI.  Advanced Directives 11/13/2019 06/28/2019 02/16/2018 01/02/2018 10/12/2016 02/05/2016 09/01/2015  Does Patient Have a Medical Advance Directive? No No No No No No No  Would patient like information on creating a medical advance directive? Yes (MAU/Ambulatory/Procedural Areas - Information given) No - Patient declined Yes (MAU/Ambulatory/Procedural Areas - Information given) No - Patient declined Yes (ED - Information  included in AVS) Yes (Inpatient - patient requests chaplain consult to create a medical advance directive) Yes - Educational materials given  Pre-existing out of facility DNR order (yellow form or pink MOST form) - - - - - - -  Some encounter information is confidential and restricted. Go to Review Flowsheets activity to see all data.    Current Medications (verified) Outpatient Encounter Medications as of 11/13/2019  Medication Sig  . ALPRAZolam (XANAX) 1 MG tablet Take 1 tablet (1 mg total) by mouth 3 (three) times daily as needed for anxiety.  . Ascorbic Acid (VITAMIN C) 1000 MG tablet Take 1,000 mg by mouth daily.  Marland Kitchen atorvastatin (LIPITOR) 40 MG tablet TAKE 1 TABLET(40 MG) BY MOUTH DAILY  . Blood Glucose Monitoring Suppl (ONE TOUCH ULTRA 2) w/Device KIT Use as advised  . Cholecalciferol (VITAMIN D3) 50 MCG (2000 UT) capsule Take 2,000 Units by mouth daily.  . Continuous Blood Gluc Receiver (FREESTYLE LIBRE 14 DAY READER) DEVI 1 Act by Does not apply route daily.  . Continuous Blood Gluc Sensor (FREESTYLE LIBRE 14 DAY SENSOR) MISC 1 Act by Does not apply route daily.  . empagliflozin (JARDIANCE) 25 MG TABS tablet Take 25 mg by mouth daily.  . famotidine (PEPCID) 20 MG tablet Take 1 tablet (20 mg total) by mouth 2 (two) times daily.  . fluticasone (FLONASE) 50 MCG/ACT nasal spray Place 1 spray into both nostrils daily as needed for allergies or rhinitis.  . furosemide (LASIX) 20 MG tablet Take 1 tablet (20 mg total) by mouth daily.  Marland Kitchen gabapentin (NEURONTIN) 300 MG capsule Take 1 capsule (300 mg total) by mouth 3 (three) times daily.  Marland Kitchen glucose blood (COOL BLOOD GLUCOSE TEST STRIPS) test strip Use to test blood sugar twice daily.  DX: E11.9  . insulin glargine, 2 Unit Dial, (TOUJEO MAX SOLOSTAR) 300 UNIT/ML Solostar Pen Inject 50 Units into the skin daily.  . Insulin Pen Needle (NOVOFINE) 32G X 6 MM MISC 1 Act by Does not apply route daily.  . Lancets (ONETOUCH ULTRASOFT) lancets Use as  instructed  . liraglutide (VICTOZA) 18 MG/3ML SOPN Inject 0.3 mLs (1.8 mg total) into the skin every morning.  . magnesium oxide (MAG-OX) 400 MG tablet TAKE ONE TABLET BY MOUTH BY MOUTH TWICE DAILY WITH BREAKFAST AND EVENING MEAL]  . metFORMIN (GLUCOPHAGE-XR) 500 MG 24 hr tablet Take 2 tablets (1,000 mg total) by mouth 2 (two) times daily with a meal.  . metoprolol succinate (TOPROL-XL) 100 MG 24 hr tablet Take 1 tablet (100 mg total) by mouth 2 (two) times daily. Take with or immediately following a meal.  . Multiple Vitamin (MULTIVITAMIN) tablet Take 1 tablet by mouth daily.  . nitroGLYCERIN (NITROSTAT) 0.4 MG SL tablet Place 1 tablet (0.4 mg total) under the tongue every 5 (five) minutes as needed for chest pain (x 3 doses). Reported on 05/08/2015  . ondansetron (ZOFRAN-ODT) 4 MG disintegrating tablet Take 1 tablet (4 mg total) by mouth every 8 (eight) hours as needed for nausea or vomiting.  Marland Kitchen PERCOCET 10-325 MG tablet Take 1 tablet by mouth 5 (five) times daily as needed.  . potassium chloride SA (KLOR-CON) 20 MEQ tablet Take 1 tablet (20 mEq total) by mouth 2 (two) times daily.  Marland Kitchen spironolactone (ALDACTONE) 25 MG tablet Take 1 tablet (25 mg total) by mouth 2 (two) times daily. (Patient taking differently: Take 25 mg by mouth daily. )  . telmisartan (MICARDIS) 40 MG tablet Take 1 tablet (40 mg total) by mouth daily.  Marland Kitchen thiamine 100 MG tablet Take 1 tablet (100 mg total) by mouth daily.  Marland Kitchen triamcinolone cream (KENALOG) 0.5 % Apply 1 application topically 3 (three) times daily.  Marland Kitchen warfarin (COUMADIN) 3 MG tablet TAKE ONE TABLET BY MOUTH DAILY OR AS DIRECTED by anticoagulation clinic  . [DISCONTINUED] atorvastatin (LIPITOR) 40 MG tablet Take 1 tablet (40 mg total) by mouth daily.  . [DISCONTINUED] spironolactone (ALDACTONE) 25 MG tablet Take 1 tablet (25 mg total) by mouth 2 (two) times daily.   Facility-Administered Encounter Medications as of 11/13/2019  Medication  . cyanocobalamin ((VITAMIN  B-12)) injection 1,000 mcg    Allergies (verified) Metformin and related, Cleocin [clindamycin hcl], Codeine, Doxycycline hyclate, Macrolides and ketolides, Morphine, Pentazocine lactate, Vibramycin [doxycycline calcium], Definity [perflutren lipid microsphere], and Sulfonamide derivatives   History: Past Medical History:  Diagnosis Date  . Arthritis   . Blood transfusion without reported diagnosis   . Bursitis   . CAD (coronary artery disease)    a. s/p CABG 2004.b. stable cath 2014 demonstrating stable CAD and continued patency of her LIMA graft.  . Chronic anticoagulation    on coumadin  . DDD (degenerative disc disease), lumbar   . Depression   . Diabetes mellitus   . Diastolic dysfunction    per echo in October 2012 with EF 50 to 55%  . Fibromyalgia   . GERD (gastroesophageal reflux disease) 10/23/2003  . Headache(784.0)   . Hyperlipidemia   . Hypertension   . Hypokalemia   . LBBB (left bundle branch block)   . Lumbar back pain   . LV dysfunction    a. EF 45% by cath 2014. b. EF 50-55% by technically difficult echo in 08/2014.  Marland Kitchen Lymphadenitis   . Morbid obesity ([place])  a. Sleep study negative for significant OSA in 11/2014.  Marland Kitchen PAF (paroxysmal atrial fibrillation) (HCC)   . Stroke [facility]) 2004   affected speech per pt   Past Surgical History:  Procedure Laterality Date  . ABDOMINAL HYSTERECTOMY    . ANGIOPLASTY  laminectomy  . CHOLECYSTECTOMY    . CORONARY ARTERY BYPASS GRAFT     LIMA to LAD   . KNEE ARTHROSCOPY    . LEFT HEART CATHETERIZATION WITH CORONARY/GRAFT ANGIOGRAM  12/07/2012   Procedure: LEFT HEART CATHETERIZATION WITH Isabel Caprice;  Surgeon: Micheline Chapman, MD;  Location: [facility] CATH LAB;  Service: Cardiovascular;;  . LUMBAR LAMINECTOMY     x3  . SPHINCTEROTOMY    . TONSILLECTOMY     Family History  Problem Relation Age of Onset  . Heart attack Father   . Heart disease Father   . Stroke Mother   . Kidney disease Other   . Stroke Other     . Arthritis Other   . Hypertension Other   . Diabetes Other   . Colon cancer Paternal Uncle   . Depression Sister   . Anxiety disorder Maternal Aunt    Social History   Socioeconomic History  . Marital status: Married    Spouse name: Not on file  . Number of children: Not on file  . Years of education: Not on file  . Highest education level: Not on file  Occupational History  . Not on file  Tobacco Use  . Smoking status: Former Smoker    Quit date: 03/07/2001    Years since quitting: 18.6  . Smokeless tobacco: Never Used  Vaping Use  . Vaping Use: Never used  Substance and Sexual Activity  . Alcohol use: No  . Drug use: No  . Sexual activity: Not Currently  Other Topics Concern  . Not on file  Social History Narrative   Lives locally, has help available if needed.   Social Determinants of Health   Financial Resource Strain: High Risk  . Difficulty of Paying Living Expenses: Hard  Food Insecurity: No Food Insecurity  . Worried About Programme researcher, broadcasting/film/video in the Last Year: Never true  . Ran Out of Food in the Last Year: Never true  Transportation Needs: No Transportation Needs  . Lack of Transportation (Medical): No  . Lack of Transportation (Non-Medical): No  Physical Activity: Inactive  . Days of Exercise per Week: 0 days  . Minutes of Exercise per Session: 0 min  Stress: No Stress Concern Present  . Feeling of Stress : Not at all  Social Connections: Moderately Isolated  . Frequency of Communication with Friends and Family: More than three times a week  . Frequency of Social Gatherings with Friends and Family: Never  . Attends Religious Services: Never  . Active Member of Clubs or Organizations: No  . Attends Banker Meetings: Never  . Marital Status: Married    Tobacco Counseling Counseling given: Not Answered   Clinical Intake:  Pre-visit preparation completed: Yes  Pain : No/denies pain     Nutritional Risks: None Diabetes: Yes CBG  done?: No Did pt. bring in CBG monitor from home?: No  How often do you need to have someone help you when you read instructions, pamphlets, or other written materials from your doctor or pharmacy?: 1 - Never What is the last grade level you completed in school?: HSG  Diabetic? yes  Interpreter Needed?: No  Information entered by :: Stehanie Ekstrom N. [place],  HTN   Activities of Daily Living In your present state of health, do you have any difficulty performing the following activities: 11/13/2019 04/24/2019  Hearing? N N  Vision? N N  Difficulty concentrating or making decisions? Y N  Walking or climbing stairs? Y Y  Comment - difficulty climbing stairs due to arthritis in knees  Dressing or bathing? Y N  Doing errands, shopping? Y N  Preparing Food and eating ? N Y  Comment - husband assist with cooking  Using the Toilet? N N  In the past six months, have you accidently leaked urine? Y Y  Comment - frequency and some incontinence  Do you have problems with loss of bowel control? N N  Managing your Medications? N N  Managing your Finances? N N  Housekeeping or managing your Housekeeping? N Y  Comment - husband assist with cleaning  Some recent data might be hidden    Patient Care Team: [name], MD as PCP - General (Internal Medicine) Sherren Mocha, MD as PCP - Cardiology (Cardiology) Deboraha Sprang, MD as Consulting Physician (Cardiology) Leona Singleton, RN as [facility], [facility] as Pharmacist (Pharmacist)  Indicate any recent Medical Services you may have received from other than [facility] providers in the past year (date may be approximate).     Assessment:   This is a routine wellness examination for Kristen [name].  Hearing/Vision screen No exam data present  Dietary issues and exercise activities discussed: Current Exercise Habits: The patient does not participate in regular exercise at present, Exercise limited by:  cardiac condition(s);neurologic condition(s);orthopedic condition(s)  Goals    .  lose weight      I will watch carbohydrates and sugars and increase my activity level by doing stretching and chair exercises.    .  Patient will report decreasing Hgb A1C by 0.2 points within the next 90 days. (pt-stated)      CARE PLAN ENTRY (see longtitudinal plan of care for additional care plan information)  Objective:  Lab Results  Component Value Date   HGBA1C 8.3 (H) 10/10/2019 .   Lab Results  Component Value Date   CREATININE 0.90 10/16/2019   CREATININE 0.86 10/10/2019   CREATININE 0.86 04/11/2019 .   Marland Kitchen No results found for: EGFR  Current Barriers:  Marland Kitchen Knowledge Deficits related to basic Diabetes pathophysiology and self care/management . Knowledge Deficits related to medications used for management of diabetes . Knowledge Deficits related to self administration of insulin . Knowledge Deficits related to self administration of injectable diabetes medications (Victoza)  Case Manager Clinical Goal(s):  Over the next 90 days, patient will demonstrate improved adherence to prescribed treatment plan for diabetes self care/management as evidenced by: decreasing Hgb A1C by 0.2 points (current 8.3) . Verbalize daily monitoring and recording of CBG within 90 days . Verbalize adherence to ADA/ carb modified diet within the next 90 days . Verbalize adherence to prescribed medication regimen within the next 90 days   Interventions:  . Provided education to patient about basic DM disease process . Reviewed medications with patient and discussed importance of medication adherence . Discussed plans with patient for ongoing care management follow up and provided patient with direct contact information for care management team . Provided patient with written educational materials related to hypo and hyperglycemia and importance of correct treatment . Reviewed scheduled/upcoming provider appointments  including: follow up with primary care provider and follow up with CCM Pharmacist . Advised patient,  providing education and rationale, to check cbg at least 3 times a day or as provider prescribed and record, calling primary care or pharmacist for findings outside established parameters.   . Referral to pharmacy ; encouraged to contact CCM Pharmacist to discuss medications and Freestyle Libre continuous monitoring system; RN Health Coach will send CCM Pharmacist Ainsley Spinner message to contact patient . Encouraged to make dental appointment and discuss teeth extractions with dentist and primary care provider  Patient Self Care Activities:  . Self administers oral medications as prescribed . Self administers insulin as prescribed . Self administers injectable DM medication (Victoza) as prescribed . Attends all scheduled provider appointments . Checks blood sugars as prescribed and utilize hyper and hypoglycemia protocol as needed . Adheres to prescribed ADA/carb modified . Continue to weigh daily and notify provider based on increased weight . Make dental appointment  Initial goal documentation     .  Pharmacy Care Plan      CARE PLAN ENTRY  Current Barriers:  . Chronic Disease Management support, education, and care coordination needs related to Hypertension, Hyperlipidemia, Diabetes, Atrial Fibrillation, and Heart Failure   Hypertension/Heart Failure BP Readings from Last 3 Encounters:  05/08/19 124/76  05/03/19 126/76  04/18/19 122/78 .  Pharmacist Clinical Goal(s): o Over the next 90 days, patient will work with PharmD and providers to maintain BP goal <130/80 . Current regimen:  o metoprolol succinate 100 mg twice daily  o telmisartan 40 mg daily,  o spironolactone 25 mg daily,  o furosemide 20 mg daily,  o potassium chloride 20 meq twice daily o Nitroglycerin 0.4 mg as needed . Interventions: o Discussed medications at length and how they work together to achieve BP  goals o Discussed benefits of maintaining BP at goal . Patient self care activities - Over the next 90 days, patient will: o Ensure daily salt intake < 2300 mg/day  Hyperlipidemia Lab Results  Component Value Date/Time   LDLCALC 65 04/11/2019 02:55 PM   [place] 48 12/26/2017 07:58 AM   LDLDIRECT 116.0 11/28/2008 11:22 AM .  Pharmacist Clinical Goal(s): o Over the next 90 days, patient will work with PharmD and providers to maintain LDL goal < 70 . Current regimen:  o Atorvastatin 40 mg daily . Interventions: o Discussed dietary recommendations to maintain LDL goals, including cutting back on fried foods . Patient self care activities - Over the next 90 days, patient will: o Reduce fried fish in diet  Diabetes Lab Results  Component Value Date/Time   HGBA1C 9.0 (A) 04/11/2019 02:39 PM   HGBA1C 9.0 (A) 12/19/2018 04:44 PM   HGBA1C 8.4 (H) 08/07/2017 02:52 PM   HGBA1C 8.0 (H) 05/25/2017 02:36 PM .  Pharmacist Clinical Goal(s): o Over the next 90 days, patient will work with PharmD and providers to achieve A1c goal <8% . Current regimen:  o Jardiance 25 mg daily (via [employer]) o metformin ER 1000 mg twice daily o Toujeo Max 50 units daily (via [employer]) o Victoza 1.8 mg daily (via [employer]) o [employer] 14-day device . Interventions: o Patient assistance approved for all brand-name medications o Discussed risk for hypoglycemia with insulin and glipizide o Discussed benefits of decreasing carbs in diet o Counseled how to apply and operate Freestyle Libre CGM . Patient self care activities - Over the next 90 days, patient will: o Check blood sugar twice daily, in the morning before eating or drinking, and at bedtime, document, and provide at future appointments o Contact provider  with any episodes of hypoglycemia o Reduce carbohydrates - bread, pasta, rice, potatoes  Atrial Fibrillation . Pharmacist Clinical Goal(s) o Over the next 90 days, patient will work with PharmD  and providers to optimize anticoagulation regimen . Current regimen:  o metoprolol succinate 100 mg BID,  o warfarin 3 mg AD . Interventions: o Discussed Lovenox bridge before shoulder surgery (TBD) - Lovenox is non-preferred drug with insurance but during coverage gap patient receives 75% discount on generic drugs, bringing price to $30-40 . Patient self care activities - Over the next 90 days, patient will: o Continue to follow with warfarin clinic for INR checks and dose changes o Follow surgical and warfarin clinic nurse instructions regarding Lovenox bridging for possible shoulder surgery o Inform pharmacist and providers of medication or diet changes o Contact pharmacist with any medication access issues  Medication management . Pharmacist Clinical Goal(s): o Over the next 90 days, patient will work with PharmD and providers to maintain optimal medication adherence . Current pharmacy: [facility] . Interventions o Comprehensive medication review performed. o Utilize [facility] pharmacy for medication synchronization, packaging and delivery . Patient self care activities - Over the next 90 days, patient will: o Focus on medication adherence by pill box o Take medications as prescribed o Report any questions or concerns to PharmD and/or provider(s)  Please see past updates related to this goal by clicking on the "Past Updates" button in the selected goal        Depression Screen PHQ 2/9 Scores 11/13/2019 04/24/2019 07/05/2018 02/16/2018 01/02/2018 12/29/2017 08/07/2017  PHQ - 2 Score 0 0 1 1 0 0 0  PHQ- 9 Score - - 10 - - - 2    Fall Risk Fall Risk  11/13/2019 10/29/2019 07/29/2019 06/28/2019 06/03/2019  Falls in the past year? 0 0 0 0 0  Number falls in past yr: 0 0 0 0 0  Injury with Fall? 0 0 0 0 0  Risk for fall due to : Impaired balance/gait;Impaired mobility;Impaired vision;Medication side effect Impaired balance/gait;Impaired mobility;Impaired vision;Medication side effect Mental  status change;Impaired balance/gait;Impaired mobility;Impaired vision;Medication side effect Impaired balance/gait;Impaired mobility;Medication side effect;Impaired vision Medication side effect;Orthopedic patient;Impaired mobility;Impaired balance/gait  Follow up Falls evaluation completed Falls evaluation completed;Falls prevention discussed;Education provided Education provided;Falls prevention discussed;Falls evaluation completed Falls evaluation completed;Education provided;Falls prevention discussed Falls evaluation completed;Education provided;Falls prevention discussed    Any stairs in or around the home? No  If so, are there any without handrails? No  Home free of loose throw rugs in walkways, pet beds, electrical cords, etc? Yes  Adequate lighting in your home to reduce risk of falls? Yes   ASSISTIVE DEVICES UTILIZED TO PREVENT FALLS:  Life alert? No  Use of a cane, walker or w/c? Yes  Grab bars in the bathroom? No  Shower chair or bench in shower? Yes  Elevated toilet seat or a handicapped toilet? No   TIMED UP AND GO:  Was the test performed? No .  Length of time to ambulate 10 feet: 0 sec.   Gait slow and steady with assistive device  Cognitive Function:        Immunizations Immunization History  Administered Date(s) Administered  . Fluad Quad(high Dose 65+) 11/16/2018  . Influenza Split 12/14/2010, 12/12/2011  . Influenza Whole 01/05/2006, 01/29/2007, 12/26/2007, 11/28/2008, 12/04/2009  . Influenza, High Dose Seasonal PF 11/12/2014, 01/26/2016, 11/25/2016, 12/27/2017  . Influenza,inj,Quad PF,6+ Mos 11/12/2012, 04/08/2014  . Moderna SARS-COVID-2 Vaccination 04/20/2019, 06/03/2019  . Pneumococcal Conjugate-13 08/09/2013  . Pneumococcal Polysaccharide-23  12/04/2009, 12/14/2010, 10/12/2016  . Td 03/07/2005  . Tdap 04/14/2016    TDAP status: Up to date Flu Vaccine status: Up to date Pneumococcal vaccine status: Up to date Covid-19 vaccine status: Completed  vaccines  Qualifies for Shingles Vaccine? Yes   Zostavax completed No   Shingrix Completed?: No.    Education has been provided regarding the importance of this vaccine. Patient has been advised to call insurance company to determine out of pocket expense if they have not yet received this vaccine. Advised may also receive vaccine at local pharmacy or Health Dept. Verbalized acceptance and understanding.  Screening Tests Health Maintenance  Topic Date Due  . MAMMOGRAM  08/31/2019  . INFLUENZA VACCINE  10/06/2019  . OPHTHALMOLOGY EXAM  03/20/2020  . HEMOGLOBIN A1C  04/11/2020  . FOOT EXAM  10/09/2020  . COLONOSCOPY  03/20/2022  . TETANUS/TDAP  04/14/2026  . DEXA SCAN  Completed  . COVID-19 Vaccine  Completed  . Hepatitis C Screening  Completed  . PNA vac Low Risk Adult  Completed    Health Maintenance  Health Maintenance Due  Topic Date Due  . MAMMOGRAM  08/31/2019  . INFLUENZA VACCINE  10/06/2019    Colorectal cancer screening: Completed 03/20/2012. Repeat every 10 years Mammogram status: Completed 08/30/2017. Repeat every year Bone Density status: Completed 11/24/2016. Results reflect: Bone density results: NORMAL. Repeat every 2 years.  Lung Cancer Screening: (Low Dose CT Chest recommended if Age 36-80 years, 30 pack-year currently smoking OR have quit w/in 15years.) does not qualify.   Lung Cancer Screening Referral: no  Additional Screening:  Hepatitis C Screening: does qualify; Completed yes  Vision Screening: Recommended annual ophthalmology exams for early detection of glaucoma and other disorders of the eye. Is the patient up to date with their annual eye exam?  Yes  Who is the provider or what is the name of the office in which the patient attends annual eye exams? [facility]  If pt is not established with a provider, would they like to be referred to a provider to establish care? No .   Dental Screening: Recommended annual dental exams for proper  oral hygiene  Community Resource Referral / Chronic Care Management: CRR required this visit?  No   CCM required this visit?  No      Plan:     I have personally reviewed and noted the following in the patient's chart:   . Medical and social history . Use of alcohol, tobacco or illicit drugs  . Current medications and supplements . Functional ability and status . Nutritional status . Physical activity . Advanced directives . List of other physicians . Hospitalizations, surgeries, and ER visits in previous 12 months . Vitals . Screenings to include cognitive, depression, and falls . Referrals and appointments  In addition, I have reviewed and discussed with patient certain preventive protocols, quality metrics, and best practice recommendations. A written personalized care plan for preventive services as well as general preventive health recommendations were provided to patient.     Sheral Flow, LPN   08/08/6801   Nurse Notes:  Patient is cogitatively intact. There were no vitals filed for this visit. There is no height or weight on file to calculate BMI. Patient stated that she has issues with gait and balance; does use assistive devices.

## 2019-11-13 NOTE — Patient Instructions (Addendum)
Kristen [name] , Thank you for taking time to come for your Medicare Wellness Visit. I appreciate your ongoing commitment to your health goals. Please review the following plan we discussed and let me know if I can assist you in the future.   Screening recommendations/referrals: Colonoscopy: 03/20/2012; due every 10 years Mammogram: 08/30/2017; due every 1-2 years Bone Density: 11/24/2016; due every 2 years; results were NORMAL Recommended yearly ophthalmology/optometry visit for glaucoma screening and checkup Recommended yearly dental visit for hygiene and checkup  Vaccinations: Influenza vaccine: 11/16/2018 Pneumococcal vaccine: completed Tdap vaccine: 04/14/2016; due every 10 years Shingles vaccine: never done   Covid-19: completed  Advanced directives: Advance directive discussed with you today. I have provided a copy for you to complete at home and have notarized. Once this is complete please bring a copy in to our office so we can scan it into your chart.  Conditions/risks identified: Yes. Reviewed health maintenance screenings with patient today and relevant education, vaccines, and/or referrals were provided. Continue doing brain stimulating activities (puzzles, reading, adult coloring books, staying active) to keep memory sharp. Continue to eat heart healthy diet (full of fruits, vegetables, whole grains, lean protein, water--limit salt, fat, and sugar intake) and increase physical activity as tolerated.  Next appointment: Please schedule your next Medicare Wellness Visit with your Nurse Health Advisor in 1 year.  Preventive Care 72 Years and Older, Female Preventive care refers to lifestyle choices and visits with your health care provider that can promote health and wellness. What does preventive care include?  A yearly physical exam. This is also called an annual well check.  Dental exams once or twice a year.  Routine eye exams. Ask your health care provider how often you  should have your eyes checked.  Personal lifestyle choices, including:  Daily care of your teeth and gums.  Regular physical activity.  Eating a healthy diet.  Avoiding tobacco and drug use.  Limiting alcohol use.  Practicing safe sex.  Taking low-dose aspirin every day.  Taking vitamin and mineral supplements as recommended by your health care provider. What happens during an annual well check? The services and screenings done by your health care provider during your annual well check will depend on your age, overall health, lifestyle risk factors, and family history of disease. Counseling  Your health care provider may ask you questions about your:  Alcohol use.  Tobacco use.  Drug use.  Emotional well-being.  Home and relationship well-being.  Sexual activity.  Eating habits.  History of falls.  Memory and ability to understand (cognition).  Work and work Statistician.  Reproductive health. Screening  You may have the following tests or measurements:  Height, weight, and BMI.  Blood pressure.  Lipid and cholesterol levels. These may be checked every 5 years, or more frequently if you are over 20 years old.  Skin check.  Lung cancer screening. You may have this screening every year starting at age 72 if you have a 30-pack-year history of smoking and currently smoke or have quit within the past 15 years.  Fecal occult blood test (FOBT) of the stool. You may have this test every year starting at age 25.  Flexible sigmoidoscopy or colonoscopy. You may have a sigmoidoscopy every 5 years or a colonoscopy every 10 years starting at age 61.  Hepatitis C blood test.  Hepatitis B blood test.  Sexually transmitted disease (STD) testing.  Diabetes screening. This is done by checking your blood sugar (glucose) after you have  not eaten for a while (fasting). You may have this done every 1-3 years.  Bone density scan. This is done to screen for osteoporosis.  You may have this done starting at age 16.  Mammogram. This may be done every 1-2 years. Talk to your health care provider about how often you should have regular mammograms. Talk with your health care provider about your test results, treatment options, and if necessary, the need for more tests. Vaccines  Your health care provider may recommend certain vaccines, such as:  Influenza vaccine. This is recommended every year.  Tetanus, diphtheria, and acellular pertussis (Tdap, Td) vaccine. You may need a Td booster every 10 years.  Zoster vaccine. You may need this after age 72.  Pneumococcal 13-valent conjugate (PCV13) vaccine. One dose is recommended after age 20.  Pneumococcal polysaccharide (PPSV23) vaccine. One dose is recommended after age 72. Talk to your health care provider about which screenings and vaccines you need and how often you need them. This information is not intended to replace advice given to you by your health care provider. Make sure you discuss any questions you have with your health care provider. Document Released: 03/20/2015 Document Revised: 11/11/2015 Document Reviewed: 12/23/2014 Elsevier Interactive Patient Education  2017 [place] Prevention in the Home Falls can cause injuries. They can happen to people of all ages. There are many things you can do to make your home safe and to help prevent falls. What can I do on the outside of my home?  Regularly fix the edges of walkways and driveways and fix any cracks.  Remove anything that might make you trip as you walk through a door, such as a raised step or threshold.  Trim any bushes or trees on the path to your home.  Use bright outdoor lighting.  Clear any walking paths of anything that might make someone trip, such as rocks or tools.  Regularly check to see if handrails are loose or broken. Make sure that both sides of any steps have handrails.  Any raised decks and porches should have  guardrails on the edges.  Have any leaves, snow, or ice cleared regularly.  Use sand or salt on walking paths during winter.  Clean up any spills in your garage right away. This includes oil or grease spills. What can I do in the bathroom?  Use night lights.  Install grab bars by the toilet and in the tub and shower. Do not use towel bars as grab bars.  Use non-skid mats or decals in the tub or shower.  If you need to sit down in the shower, use a plastic, non-slip stool.  Keep the floor dry. Clean up any water that spills on the floor as soon as it happens.  Remove soap buildup in the tub or shower regularly.  Attach bath mats securely with double-sided non-slip rug tape.  Do not have throw rugs and other things on the floor that can make you trip. What can I do in the bedroom?  Use night lights.  Make sure that you have a light by your bed that is easy to reach.  Do not use any sheets or blankets that are too big for your bed. They should not hang down onto the floor.  Have a firm chair that has side arms. You can use this for support while you get dressed.  Do not have throw rugs and other things on the floor that can make you trip. What can  I do in the kitchen?  Clean up any spills right away.  Avoid walking on wet floors.  Keep items that you use a lot in easy-to-reach places.  If you need to reach something above you, use a strong step stool that has a grab bar.  Keep electrical cords out of the way.  Do not use floor polish or wax that makes floors slippery. If you must use wax, use non-skid floor wax.  Do not have throw rugs and other things on the floor that can make you trip. What can I do with my stairs?  Do not leave any items on the stairs.  Make sure that there are handrails on both sides of the stairs and use them. Fix handrails that are broken or loose. Make sure that handrails are as long as the stairways.  Check any carpeting to make sure that  it is firmly attached to the stairs. Fix any carpet that is loose or worn.  Avoid having throw rugs at the top or bottom of the stairs. If you do have throw rugs, attach them to the floor with carpet tape.  Make sure that you have a light switch at the top of the stairs and the bottom of the stairs. If you do not have them, ask someone to add them for you. What else can I do to help prevent falls?  Wear shoes that:  Do not have high heels.  Have rubber bottoms.  Are comfortable and fit you well.  Are closed at the toe. Do not wear sandals.  If you use a stepladder:  Make sure that it is fully opened. Do not climb a closed stepladder.  Make sure that both sides of the stepladder are locked into place.  Ask someone to hold it for you, if possible.  Clearly mark and make sure that you can see:  Any grab bars or handrails.  First and last steps.  Where the edge of each step is.  Use tools that help you move around (mobility aids) if they are needed. These include:  Canes.  Walkers.  Scooters.  Crutches.  Turn on the lights when you go into a dark area. Replace any light bulbs as soon as they burn out.  Set up your furniture so you have a clear path. Avoid moving your furniture around.  If any of your floors are uneven, fix them.  If there are any pets around you, be aware of where they are.  Review your medicines with your doctor. Some medicines can make you feel dizzy. This can increase your chance of falling. Ask your doctor what other things that you can do to help prevent falls. This information is not intended to replace advice given to you by your health care provider. Make sure you discuss any questions you have with your health care provider. Document Released: 12/18/2008 Document Revised: 07/30/2015 Document Reviewed: 03/28/2014 Elsevier Interactive Patient Education  2017 [employer].

## 2019-11-14 ENCOUNTER — Telehealth: Payer: Self-pay | Admitting: Pharmacist

## 2019-11-14 NOTE — Progress Notes (Signed)
Chronic Care Management Pharmacy Assistant   Name: Kristen [name]  MRN: [id number] DOB: 11/07/1945  Reason for Encounter: Medication Review   PCP : [name], MD  Allergies:   Allergies  Allergen Reactions  . Metformin And Related Other (See Comments)    Must take XR form only, cannot tolerate Regular release metformin  . Cleocin [Clindamycin Hcl] Diarrhea  . Codeine Itching  . Doxycycline Hyclate Diarrhea  . Macrolides And Ketolides Diarrhea  . Morphine Hives  . Pentazocine Lactate Itching and Nausea And Vomiting    (GENERIC- [place])  . Vibramycin [Doxycycline Calcium] Diarrhea and Rash  . Definity [Perflutren Lipid Microsphere] Other (See Comments)    Patient complained of warm sensation in chest x few seconds duration when injected Definity.No other symptoms  . Sulfonamide Derivatives Rash    Medications: Outpatient Encounter Medications as of 11/14/2019  Medication Sig  . ALPRAZolam (XANAX) 1 MG tablet Take 1 tablet (1 mg total) by mouth 3 (three) times daily as needed for anxiety.  . Ascorbic Acid (VITAMIN C) 1000 MG tablet Take 1,000 mg by mouth daily.  Marland Kitchen atorvastatin (LIPITOR) 40 MG tablet TAKE 1 TABLET(40 MG) BY MOUTH DAILY  . Blood Glucose Monitoring Suppl (ONE TOUCH ULTRA 2) w/Device KIT Use as advised  . Cholecalciferol (VITAMIN D3) 50 MCG (2000 UT) capsule Take 2,000 Units by mouth daily.  . Continuous Blood Gluc Receiver (FREESTYLE LIBRE 14 DAY READER) DEVI 1 Act by Does not apply route daily.  . Continuous Blood Gluc Sensor (FREESTYLE LIBRE 14 DAY SENSOR) MISC 1 Act by Does not apply route daily.  . empagliflozin (JARDIANCE) 25 MG TABS tablet Take 25 mg by mouth daily.  . famotidine (PEPCID) 20 MG tablet Take 1 tablet (20 mg total) by mouth 2 (two) times daily.  . fluticasone (FLONASE) 50 MCG/ACT nasal spray Place 1 spray into both nostrils daily as needed for allergies or rhinitis.  . furosemide (LASIX) 20 MG tablet Take 1 tablet (20 mg total) by  mouth daily.  Marland Kitchen gabapentin (NEURONTIN) 300 MG capsule Take 1 capsule (300 mg total) by mouth 3 (three) times daily.  Marland Kitchen glucose blood (COOL BLOOD GLUCOSE TEST STRIPS) test strip Use to test blood sugar twice daily. DX: E11.9  . insulin glargine, 2 Unit Dial, (TOUJEO MAX SOLOSTAR) 300 UNIT/ML Solostar Pen Inject 50 Units into the skin daily.  . Insulin Pen Needle (NOVOFINE) 32G X 6 MM MISC 1 Act by Does not apply route daily.  . Lancets (ONETOUCH ULTRASOFT) lancets Use as instructed  . liraglutide (VICTOZA) 18 MG/3ML SOPN Inject 0.3 mLs (1.8 mg total) into the skin every morning.  . magnesium oxide (MAG-OX) 400 MG tablet TAKE ONE TABLET BY MOUTH BY MOUTH TWICE DAILY WITH BREAKFAST AND EVENING MEAL]  . metFORMIN (GLUCOPHAGE-XR) 500 MG 24 hr tablet Take 2 tablets (1,000 mg total) by mouth 2 (two) times daily with a meal.  . metoprolol succinate (TOPROL-XL) 100 MG 24 hr tablet Take 1 tablet (100 mg total) by mouth 2 (two) times daily. Take with or immediately following a meal.  . Multiple Vitamin (MULTIVITAMIN) tablet Take 1 tablet by mouth daily.  . nitroGLYCERIN (NITROSTAT) 0.4 MG SL tablet Place 1 tablet (0.4 mg total) under the tongue every 5 (five) minutes as needed for chest pain (x 3 doses). Reported on 05/08/2015  . ondansetron (ZOFRAN-ODT) 4 MG disintegrating tablet Take 1 tablet (4 mg total) by mouth every 8 (eight) hours as needed for nausea or vomiting.  Marland Kitchen  PERCOCET 10-325 MG tablet Take 1 tablet by mouth 5 (five) times daily as needed.  . potassium chloride SA (KLOR-CON) 20 MEQ tablet Take 1 tablet (20 mEq total) by mouth 2 (two) times daily.  Marland Kitchen spironolactone (ALDACTONE) 25 MG tablet Take 1 tablet (25 mg total) by mouth 2 (two) times daily. (Patient taking differently: Take 25 mg by mouth daily. )  . telmisartan (MICARDIS) 40 MG tablet Take 1 tablet (40 mg total) by mouth daily.  Marland Kitchen thiamine 100 MG tablet Take 1 tablet (100 mg total) by mouth daily.  Marland Kitchen triamcinolone cream (KENALOG) 0.5 % Apply  1 application topically 3 (three) times daily.  Marland Kitchen warfarin (COUMADIN) 3 MG tablet TAKE ONE TABLET BY MOUTH DAILY OR AS DIRECTED by anticoagulation clinic  . [DISCONTINUED] atorvastatin (LIPITOR) 40 MG tablet Take 1 tablet (40 mg total) by mouth daily.  . [DISCONTINUED] spironolactone (ALDACTONE) 25 MG tablet Take 1 tablet (25 mg total) by mouth 2 (two) times daily.   Facility-Administered Encounter Medications as of 11/14/2019  Medication  . cyanocobalamin ((VITAMIN B-12)) injection 1,000 mcg    Current Diagnosis: Patient Active Problem List   Diagnosis Date Noted  . Thiamine deficiency 12/30/2018  . Vitamin B12 deficiency anemia due to intrinsic factor deficiency 12/20/2018  . CHF (congestive heart failure), NYHA class I, chronic, combined ([place]) 12/19/2018  . Coronary artery disease involving native coronary artery of native heart with angina pectoris ([place]) 12/19/2018  . Obesity with body mass index (BMI) of 30.0 to 39.9 01/15/2018  . Chronic idiopathic constipation 12/27/2017  . Atrial fibrillation ([place]) 10/30/2017  . Type 2 diabetes mellitus ([place]) 10/30/2017  . Eczema 08/07/2017  . Insomnia secondary to chronic pain 08/04/2016  . Visit for screening mammogram 04/14/2016  . LV dysfunction   . Routine general medical examination at a health care facility 05/03/2013  . DM (diabetes mellitus), type 2 with peripheral vascular complications ([place]) 23/95/3202  . PVC's/nonsustained VT 10/24/2012  . H/O: CVA (cerebrovascular accident) 02/09/2012  . Hypothyroidism 05/27/2011  . Fibromyalgia 10/08/2010  . Long term current use of anticoagulant 05/05/2010  . Low back pain, non-specific 05/12/2008  . Cognitive dysfunction associated with depression 01/29/2007  . Osteoarthritis 11/22/2006  . Hyperlipidemia associated with type 2 diabetes mellitus ([place]) 09/13/2006  . Coronary atherosclerosis 09/13/2006  . Paroxysmal atrial fibrillation ([place]) 09/13/2006  . GERD 09/13/2006  . Hypertension  associated with diabetes ([place]) 08/30/2006    Goals Addressed   None     Follow-Up:  [facility]    Received call from patient regarding medication management via [facility].  Patient requested an acute fill for Percocet, Alprazolam  to be delivered: 11/15/2019 Pharmacy needs refills? No  Confirmed delivery date of 11/15/2019, advised patient that pharmacy will contact them the morning of delivery.  Rosendo Gros, [facility] Pharmacist Assistant  [phone]

## 2019-11-15 ENCOUNTER — Encounter: Payer: Self-pay | Admitting: Internal Medicine

## 2019-11-15 ENCOUNTER — Ambulatory Visit: Payer: Medicare Other

## 2019-11-19 ENCOUNTER — Encounter: Payer: Self-pay | Admitting: Cardiovascular Disease

## 2019-11-19 ENCOUNTER — Other Ambulatory Visit: Payer: Self-pay

## 2019-11-19 ENCOUNTER — Ambulatory Visit: Payer: Medicare Other | Admitting: Cardiovascular Disease

## 2019-11-19 VITALS — BP 104/66 | HR 95 | Ht 61.0 in | Wt 210.0 lb

## 2019-11-19 DIAGNOSIS — I10 Essential (primary) hypertension: Secondary | ICD-10-CM

## 2019-11-19 DIAGNOSIS — I25119 Atherosclerotic heart disease of native coronary artery with unspecified angina pectoris: Secondary | ICD-10-CM | POA: Diagnosis not present

## 2019-11-19 DIAGNOSIS — I480 Paroxysmal atrial fibrillation: Secondary | ICD-10-CM

## 2019-11-19 DIAGNOSIS — E782 Mixed hyperlipidemia: Secondary | ICD-10-CM

## 2019-11-19 NOTE — Progress Notes (Signed)
Cardiology Office Note:    Date:  11/19/2019   ID:  Kristen [name], DOB Dec 26, 1945, MRN [id number]  PCP:  [name], MD  [facility] Cardiologist:  Sherren Mocha, MD  [place] Electrophysiologist:  None   Referring MD: [name], MD   Chief Complaint  Patient presents with  . Coronary Artery Disease    History of Present Illness:    Kristen [name] is a 74 y.o. female with a hx of coronary artery disease status post CABG in 2004. Other medical problems include paroxysmal atrial fibrillationon warfarin, history of stroke, obesity, diabetes, hypertension, and left bundle branch block.  Past Medical History:  Diagnosis Date  . Arthritis   . Blood transfusion without reported diagnosis   . Bursitis   . CAD (coronary artery disease)    a. s/p CABG 2004.b. stable cath 2014 demonstrating stable CAD and continued patency of her LIMA graft.  . Chronic anticoagulation    on coumadin  . DDD (degenerative disc disease), lumbar   . Depression   . Diabetes mellitus   . Diastolic dysfunction    per echo in October 2012 with EF 50 to 55%  . Fibromyalgia   . GERD (gastroesophageal reflux disease) 10/23/2003  . Headache(784.0)   . Hyperlipidemia   . Hypertension   . Hypokalemia   . LBBB (left bundle branch block)   . Lumbar back pain   . LV dysfunction    a. EF 45% by cath 2014. b. EF 50-55% by technically difficult echo in 08/2014.  Marland Kitchen Lymphadenitis   . Morbid obesity ([place])    a. Sleep study negative for significant OSA in 11/2014.  Marland Kitchen PAF (paroxysmal atrial fibrillation) ([place])   . Stroke [facility]) 2004   affected speech per pt    Past Surgical History:  Procedure Laterality Date  . ABDOMINAL HYSTERECTOMY    . ANGIOPLASTY  laminectomy  . CHOLECYSTECTOMY    . CORONARY ARTERY BYPASS GRAFT     LIMA to LAD   . KNEE ARTHROSCOPY    . LEFT HEART CATHETERIZATION WITH CORONARY/GRAFT ANGIOGRAM  12/07/2012   Procedure: LEFT HEART CATHETERIZATION WITH  Beatrix Fetters;  Surgeon: Blane Ohara, MD;  Location: [facility] CATH LAB;  Service: Cardiovascular;;  . LUMBAR LAMINECTOMY     x3  . SPHINCTEROTOMY    . TONSILLECTOMY      Current Medications: Current Meds  Medication Sig  . ALPRAZolam (XANAX) 1 MG tablet Take 1 tablet (1 mg total) by mouth 3 (three) times daily as needed for anxiety.  . Ascorbic Acid (VITAMIN C) 1000 MG tablet Take 1,000 mg by mouth daily.  Marland Kitchen atorvastatin (LIPITOR) 40 MG tablet TAKE 1 TABLET(40 MG) BY MOUTH DAILY  . Blood Glucose Monitoring Suppl (ONE TOUCH ULTRA 2) w/Device KIT Use as advised  . Cholecalciferol (VITAMIN D3) 50 MCG (2000 UT) capsule Take 2,000 Units by mouth daily.  . empagliflozin (JARDIANCE) 25 MG TABS tablet Take 25 mg by mouth daily.  . furosemide (LASIX) 20 MG tablet Take 1 tablet (20 mg total) by mouth daily.  Marland Kitchen gabapentin (NEURONTIN) 300 MG capsule Take 1 capsule (300 mg total) by mouth 3 (three) times daily.  Marland Kitchen glucose blood (COOL BLOOD GLUCOSE TEST STRIPS) test strip Use to test blood sugar twice daily. DX: E11.9  . insulin glargine, 2 Unit Dial, (TOUJEO MAX SOLOSTAR) 300 UNIT/ML Solostar Pen Inject 50 Units into the skin daily.  . Insulin Pen Needle (NOVOFINE) 32G X 6 MM MISC 1  Act by Does not apply route daily.  . Lancets (ONETOUCH ULTRASOFT) lancets Use as instructed  . liraglutide (VICTOZA) 18 MG/3ML SOPN Inject 0.3 mLs (1.8 mg total) into the skin every morning.  . magnesium oxide (MAG-OX) 400 MG tablet TAKE ONE TABLET BY MOUTH BY MOUTH TWICE DAILY WITH BREAKFAST AND EVENING MEAL]  . metFORMIN (GLUCOPHAGE-XR) 500 MG 24 hr tablet Take 2 tablets (1,000 mg total) by mouth 2 (two) times daily with a meal.  . metoprolol succinate (TOPROL-XL) 100 MG 24 hr tablet Take 1 tablet (100 mg total) by mouth 2 (two) times daily. Take with or immediately following a meal.  . Multiple Vitamin (MULTIVITAMIN) tablet Take 1 tablet by mouth daily.  . nitroGLYCERIN (NITROSTAT) 0.4 MG SL tablet Place 1  tablet (0.4 mg total) under the tongue every 5 (five) minutes as needed for chest pain (x 3 doses). Reported on 05/08/2015  . ondansetron (ZOFRAN-ODT) 4 MG disintegrating tablet Take 1 tablet (4 mg total) by mouth every 8 (eight) hours as needed for nausea or vomiting.  Marland Kitchen PERCOCET 10-325 MG tablet Take 1 tablet by mouth 5 (five) times daily as needed.  . potassium chloride SA (KLOR-CON) 20 MEQ tablet Take 1 tablet (20 mEq total) by mouth 2 (two) times daily.  Marland Kitchen spironolactone (ALDACTONE) 25 MG tablet Take 1 tablet (25 mg total) by mouth 2 (two) times daily. (Patient taking differently: Take 25 mg by mouth daily. )  . telmisartan (MICARDIS) 40 MG tablet Take 1 tablet (40 mg total) by mouth daily.  Marland Kitchen thiamine 100 MG tablet Take 1 tablet (100 mg total) by mouth daily.  Marland Kitchen triamcinolone cream (KENALOG) 0.5 % Apply 1 application topically 3 (three) times daily.  Marland Kitchen warfarin (COUMADIN) 3 MG tablet TAKE ONE TABLET BY MOUTH DAILY OR AS DIRECTED by anticoagulation clinic   Current Facility-Administered Medications for the 11/19/19 encounter (Office Visit) with Sherren Mocha, MD  Medication  . cyanocobalamin ((VITAMIN B-12)) injection 1,000 mcg     Allergies:   Metformin and related, Cleocin [clindamycin hcl], Codeine, Doxycycline hyclate, Macrolides and ketolides, Morphine, Pentazocine lactate, Vibramycin [doxycycline calcium], Clindamycin, Definity [perflutren lipid microsphere], Doxycycline, Metformin, Pentazocine, Perflutren, Perflutren lipid microspheres, Sulfa antibiotics, and Sulfonamide derivatives   Social History   Socioeconomic History  . Marital status: Married    Spouse name: Not on file  . Number of children: Not on file  . Years of education: Not on file  . Highest education level: Not on file  Occupational History  . Not on file  Tobacco Use  . Smoking status: Former Smoker    Quit date: 03/07/2001    Years since quitting: 18.7  . Smokeless tobacco: Never Used  Vaping Use  . Vaping  Use: Never used  Substance and Sexual Activity  . Alcohol use: No  . Drug use: No  . Sexual activity: Not Currently  Other Topics Concern  . Not on file  Social History Narrative   Lives locally, has help available if needed.   Social Determinants of Health   Financial Resource Strain: High Risk  . Difficulty of Paying Living Expenses: Hard  Food Insecurity: No Food Insecurity  . Worried About Charity fundraiser in the Last Year: Never true  . Ran Out of Food in the Last Year: Never true  Transportation Needs: No Transportation Needs  . Lack of Transportation (Medical): No  . Lack of Transportation (Non-Medical): No  Physical Activity: Inactive  . Days of Exercise per Week: 0 days  .  Minutes of Exercise per Session: 0 min  Stress: No Stress Concern Present  . Feeling of Stress : Not at all  Social Connections: Moderately Isolated  . Frequency of Communication with Friends and Family: More than three times a week  . Frequency of Social Gatherings with Friends and Family: Never  . Attends Religious Services: Never  . Active Member of Clubs or Organizations: No  . Attends Archivist Meetings: Never  . Marital Status: Married     Family History: The patient's family history includes Anxiety disorder in her maternal aunt; Arthritis in an other family member; Colon cancer in her paternal uncle; Depression in her sister; Diabetes in an other family member; Heart attack in her father; Heart disease in her father; Hypertension in an other family member; Kidney disease in an other family member; Stroke in her mother and another family member.  ROS:   Please see the history of present illness.    All other systems reviewed and are negative.  EKGs/Labs/Other Studies Reviewed:    The following studies were reviewed today: Event Monitor 07-07-2016: Study Highlights  The basic rhythm is sinus. There are few ventricular ectopics with one triplet present. No evidence of  atrial fibrillation or flutter No significant pauses  Myoview Stress Test 01-07-2015: Study Highlights   The left ventricular ejection fraction is mildly decreased (45-54%).  Nuclear stress EF: 49%.  Defect 1: There is a medium defect of moderate severity present in the apex location. This is most likely to be apical thinning.  This is a low risk study.  Echo 09-02-2014: Study Conclusions   - Procedure narrative: Transthoracic echocardiography for left  ventricular function evaluation, for right ventricular function  evaluation, and for assessment of valvular function. Image  quality was suboptimal. Intravenous contrast (Definity) was  administered to enhance regional wall motion assessment and  opacify the LV.  - Left ventricle: The cavity size was normal. Wall thickness was  normal. Systolic function was normal. The estimated ejection  fraction was in the range of 50% to 55%. Images were inadequate  for LV wall motion assessment. The study is not technically  sufficient to allow evaluation of LV diastolic function.   Impressions:   - Very technically difficult study. LVEF is around 50-55%,  inccordinate septal motion, definity contrast was administered,  however, echo windows remained poor.   EKG:  EKG is ordered today.  The ekg ordered today demonstrates normal sinus rhythm 95 bpm, left bundle branch block, no change from previous tracings.  Recent Labs: 12/19/2018: Pro B Natriuretic peptide (BNP) 21.0 04/11/2019: ALT 13 10/10/2019: Hemoglobin 12.2; Platelets 308; TSH 1.55 10/16/2019: BUN 18; Creat 0.90; Potassium 4.3; Sodium 139  Recent Lipid Panel    Component Value Date/Time   CHOL 126 04/11/2019 1455   CHOL 108 12/26/2017 0758   TRIG 123.0 04/11/2019 1455   HDL 36.70 (L) 04/11/2019 1455   HDL 38 (L) 12/26/2017 0758   CHOLHDL 3 04/11/2019 1455   VLDL 24.6 04/11/2019 1455   LDLCALC 65 04/11/2019 1455   LDLCALC 48 12/26/2017 0758   LDLDIRECT  116.0 11/28/2008 1122    Physical Exam:    VS:  BP 104/66   Pulse 95   Ht $R'5\' 1"'xM$  (1.549 m)   Wt 210 lb (95.3 kg)   BMI 39.68 kg/m     Wt Readings from Last 3 Encounters:  11/19/19 210 lb (95.3 kg)  10/16/19 211 lb (95.7 kg)  10/10/19 213 lb (96.6 kg)  GEN:  Well nourished, well developed in no acute distress HEENT: Normal NECK: No JVD; No carotid bruits LYMPHATICS: No lymphadenopathy CARDIAC: RRR, no murmurs, rubs, gallops RESPIRATORY:  Clear to auscultation without rales, wheezing or rhonchi  ABDOMEN: Soft, non-tender, non-distended MUSCULOSKELETAL:  No edema; No deformity  SKIN: Warm and dry NEUROLOGIC:  Alert and oriented x 3 PSYCHIATRIC:  Normal affect   ASSESSMENT:    1. Coronary artery disease involving native coronary artery of native heart with angina pectoris ([place])   2. PAF (paroxysmal atrial fibrillation) ([place])   3. Essential hypertension   4. Mixed hyperlipidemia    PLAN:    In order of problems listed above:  1. Stable on current medical therapy which includes atorvastatin, metoprolol, and warfarin anticoagulation.  No angina since her last visit here 6 months ago.  No changes made today. 2. Maintaining sinus rhythm.  Treated with warfarin for oral anticoagulation for prevention of thromboembolism. 3. Blood pressure is well controlled on metoprolol succinate, telmisartan, and spironolactone. 4. Lipids are at goal with LDL cholesterol 65 mg/dL, treated with atorvastatin 40 mg daily.  The patient appears stable from a cardiovascular perspective.  Her hemoglobin A1c has come down from 9.0 to 8.3 mg/dL.  She continues to work on this with lifestyle modification.  Her PCP follows her diabetes.  I will see her back in 6 months.   Medication Adjustments/Labs and Tests Ordered: Current medicines are reviewed at length with the patient today.  Concerns regarding medicines are outlined above.  Orders Placed This Encounter  Procedures  . EKG 12-Lead   No  orders of the defined types were placed in this encounter.   Patient Instructions  Medication Instructions:  Your provider recommends that you continue on your current medications as directed. Please refer to the Current Medication list given to you today.   *If you need a refill on your cardiac medications before your next appointment, please call your pharmacy*  Follow-Up: At [facility], you and your health needs are our priority.  As part of our continuing mission to provide you with exceptional heart care, we have created designated Provider Care Teams.  These Care Teams include your primary Cardiologist (physician) and Advanced Practice Providers (APPs -  Physician Assistants and Nurse Practitioners) who all work together to provide you with the care you need, when you need it. Your next appointment:   6 month(s) The format for your next appointment:   In Person Provider:   You may see Sherren Mocha, MD or one of the following Advanced Practice Providers on your designated Care Team:    Richardson Dopp, PA-C  Robbie Lis, [place]      Signed, Sherren Mocha, MD  11/19/2019 3:55 PM    [place]

## 2019-11-19 NOTE — Patient Instructions (Signed)
Medication Instructions:  Your provider recommends that you continue on your current medications as directed. Please refer to the Current Medication list given to you today.   *If you need a refill on your cardiac medications before your next appointment, please call your pharmacy*   Follow-Up: At [facility], you and your health needs are our priority.  As part of our continuing mission to provide you with exceptional heart care, we have created designated Provider Care Teams.  These Care Teams include your primary Cardiologist (physician) and Advanced Practice Providers (APPs -  Physician Assistants and Nurse Practitioners) who all work together to provide you with the care you need, when you need it. Your next appointment:   6 month(s) The format for your next appointment:   In Person Provider:   You may see Michael Cooper, MD or one of the following Advanced Practice Providers on your designated Care Team:    Scott Weaver, PA-C  Vin Bhagat, PA-C   

## 2019-11-25 ENCOUNTER — Telehealth: Payer: Self-pay | Admitting: Pharmacist

## 2019-11-25 NOTE — Progress Notes (Signed)
Chronic Care Management Pharmacy Assistant   Name: Kristen [name]  MRN: [id number] DOB: 21-Oct-1945  Reason for Encounter: Medication Review  Patient Questions:  1.  Have you seen any other providers since your last visit? Yes, The patient saw Dr. Burt Knack on 11/19/2019  2.  Any changes in your medicines or health? No   PCP : [name], MD  Allergies:   Allergies  Allergen Reactions  . Metformin And Related Other (See Comments)    Must take XR form only, cannot tolerate Regular release metformin  . Cleocin [Clindamycin Hcl] Diarrhea  . Codeine Itching  . Doxycycline Hyclate Diarrhea  . Macrolides And Ketolides Diarrhea  . Morphine Hives  . Pentazocine Lactate Itching and Nausea And Vomiting    (GENERIC- [place])  . Vibramycin [Doxycycline Calcium] Diarrhea and Rash  . Clindamycin Diarrhea  . Definity [Perflutren Lipid Microsphere] Other (See Comments)    Patient complained of warm sensation in chest x few seconds duration when injected Definity.No other symptoms  . Doxycycline Diarrhea  . Metformin Other (See Comments)  . Pentazocine Nausea Only  . Perflutren Other (See Comments)  . Perflutren Lipid Microspheres Other (See Comments)  . Sulfa Antibiotics Diarrhea and Rash  . Sulfonamide Derivatives Rash    Medications: Outpatient Encounter Medications as of 11/25/2019  Medication Sig  . ALPRAZolam (XANAX) 1 MG tablet Take 1 tablet (1 mg total) by mouth 3 (three) times daily as needed for anxiety.  . Ascorbic Acid (VITAMIN C) 1000 MG tablet Take 1,000 mg by mouth daily.  Marland Kitchen atorvastatin (LIPITOR) 40 MG tablet TAKE 1 TABLET(40 MG) BY MOUTH DAILY  . Blood Glucose Monitoring Suppl (ONE TOUCH ULTRA 2) w/Device KIT Use as advised  . Cholecalciferol (VITAMIN D3) 50 MCG (2000 UT) capsule Take 2,000 Units by mouth daily.  . empagliflozin (JARDIANCE) 25 MG TABS tablet Take 25 mg by mouth daily.  . furosemide (LASIX) 20 MG tablet Take 1 tablet (20 mg total) by mouth  daily.  Marland Kitchen gabapentin (NEURONTIN) 300 MG capsule Take 1 capsule (300 mg total) by mouth 3 (three) times daily.  Marland Kitchen glucose blood (COOL BLOOD GLUCOSE TEST STRIPS) test strip Use to test blood sugar twice daily. DX: E11.9  . insulin glargine, 2 Unit Dial, (TOUJEO MAX SOLOSTAR) 300 UNIT/ML Solostar Pen Inject 50 Units into the skin daily.  . Insulin Pen Needle (NOVOFINE) 32G X 6 MM MISC 1 Act by Does not apply route daily.  . Lancets (ONETOUCH ULTRASOFT) lancets Use as instructed  . liraglutide (VICTOZA) 18 MG/3ML SOPN Inject 0.3 mLs (1.8 mg total) into the skin every morning.  . magnesium oxide (MAG-OX) 400 MG tablet TAKE ONE TABLET BY MOUTH BY MOUTH TWICE DAILY WITH BREAKFAST AND EVENING MEAL]  . metFORMIN (GLUCOPHAGE-XR) 500 MG 24 hr tablet Take 2 tablets (1,000 mg total) by mouth 2 (two) times daily with a meal.  . metoprolol succinate (TOPROL-XL) 100 MG 24 hr tablet Take 1 tablet (100 mg total) by mouth 2 (two) times daily. Take with or immediately following a meal.  . Multiple Vitamin (MULTIVITAMIN) tablet Take 1 tablet by mouth daily.  . nitroGLYCERIN (NITROSTAT) 0.4 MG SL tablet Place 1 tablet (0.4 mg total) under the tongue every 5 (five) minutes as needed for chest pain (x 3 doses). Reported on 05/08/2015  . ondansetron (ZOFRAN-ODT) 4 MG disintegrating tablet Take 1 tablet (4 mg total) by mouth every 8 (eight) hours as needed for nausea or vomiting.  Marland Kitchen PERCOCET 10-325 MG tablet  Take 1 tablet by mouth 5 (five) times daily as needed.  . potassium chloride SA (KLOR-CON) 20 MEQ tablet Take 1 tablet (20 mEq total) by mouth 2 (two) times daily.  Marland Kitchen spironolactone (ALDACTONE) 25 MG tablet Take 1 tablet (25 mg total) by mouth 2 (two) times daily. (Patient taking differently: Take 25 mg by mouth daily. )  . telmisartan (MICARDIS) 40 MG tablet Take 1 tablet (40 mg total) by mouth daily.  Marland Kitchen thiamine 100 MG tablet Take 1 tablet (100 mg total) by mouth daily.  Marland Kitchen triamcinolone cream (KENALOG) 0.5 % Apply 1  application topically 3 (three) times daily.  Marland Kitchen warfarin (COUMADIN) 3 MG tablet TAKE ONE TABLET BY MOUTH DAILY OR AS DIRECTED by anticoagulation clinic  . [DISCONTINUED] atorvastatin (LIPITOR) 40 MG tablet Take 1 tablet (40 mg total) by mouth daily.   Facility-Administered Encounter Medications as of 11/25/2019  Medication  . cyanocobalamin ((VITAMIN B-12)) injection 1,000 mcg    Current Diagnosis: Patient Active Problem List   Diagnosis Date Noted  . Thiamine deficiency 12/30/2018  . Vitamin B12 deficiency anemia due to intrinsic factor deficiency 12/20/2018  . CHF (congestive heart failure), NYHA class I, chronic, combined ([place]) 12/19/2018  . Coronary artery disease involving native coronary artery of native heart with angina pectoris ([place]) 12/19/2018  . Obesity with body mass index (BMI) of 30.0 to 39.9 01/15/2018  . Chronic idiopathic constipation 12/27/2017  . Atrial fibrillation ([place]) 10/30/2017  . Type 2 diabetes mellitus ([place]) 10/30/2017  . Eczema 08/07/2017  . Insomnia secondary to chronic pain 08/04/2016  . Visit for screening mammogram 04/14/2016  . LV dysfunction   . Routine general medical examination at a health care facility 05/03/2013  . DM (diabetes mellitus), type 2 with peripheral vascular complications ([place]) 85/46/2703  . PVC's/nonsustained VT 10/24/2012  . H/O: CVA (cerebrovascular accident) 02/09/2012  . Hypothyroidism 05/27/2011  . Fibromyalgia 10/08/2010  . Long term current use of anticoagulant 05/05/2010  . Low back pain, non-specific 05/12/2008  . Cognitive dysfunction associated with depression 01/29/2007  . Osteoarthritis 11/22/2006  . Hyperlipidemia associated with type 2 diabetes mellitus ([place]) 09/13/2006  . Coronary atherosclerosis 09/13/2006  . Paroxysmal atrial fibrillation ([place]) 09/13/2006  . GERD 09/13/2006  . Hypertension associated with diabetes ([place]) 08/30/2006    Goals Addressed   None     Follow-Up:  Coordination of Enhanced  Pharmacy Services   Reviewed chart for medication changes ahead of medication coordination call.  No OVs, Consults, or hospital visits since last care coordination call/Pharmacist visit. (If appropriate, list visit date, provider name)  No medication changes indicated OR if recent visit, treatment plan here.  BP Readings from Last 3 Encounters:  11/19/19 104/66  10/16/19 112/72  10/10/19 120/70    Lab Results  Component Value Date   HGBA1C 8.3 (H) 10/10/2019     Patient obtains medications through Vials  90 Days   Last adherence delivery included:   May oxide 400mg  twice daily  Metoprolol Succinate ER 100mg  twice daily Telmisartan 40mg  daily Atorvastatin  40mg  daily Gabapentin 300mg  three times daily  Onetouch Lancets  Onetouch Blue test strips   Patient is due for next adherence delivery on: 12/31/2019. Called patient and reviewed medications and coordinated delivery.   Coordinated acute fill for Spironolactone 25mg  twice daily, Warfarin  3mg  to be delivered 11/26/2019.  Rosendo Gros, [place] Pharmacist Assistant  [phone]

## 2019-11-26 ENCOUNTER — Other Ambulatory Visit: Payer: Self-pay | Admitting: Internal Medicine

## 2019-11-26 ENCOUNTER — Ambulatory Visit: Payer: Medicare Other

## 2019-11-26 DIAGNOSIS — Z1231 Encounter for screening mammogram for malignant neoplasm of breast: Secondary | ICD-10-CM

## 2019-11-28 ENCOUNTER — Encounter: Payer: Self-pay | Admitting: *Deleted

## 2019-11-28 ENCOUNTER — Other Ambulatory Visit: Payer: Self-pay | Admitting: *Deleted

## 2019-11-28 NOTE — Patient Outreach (Signed)
[facility]) Care Management  11/28/2019  Kristen [name] 1945-12-27 [id number]   RN Health Coach Monthly Outreach  Referral Date:01/02/2018 Referral Source:MD Referral Screening Reason for Referral:Disease Management Education Insurance:[employer] Medicare   Outreach Attempt:  Successful telephone outreach to patient for follow up.  HIPAA verified with patient.  Patient denying any recent sick days or recent falls.  States she could not afford to continue with the freestyle libre continuous monitoring system and has went back to using her regular glucose meter.  Did not check fasting blood sugar this morning but report recent fasting ranges of 130-140's.  Continues to state she would like to resume her Glipizide, encouraged to speak with CCM Pharmacist concerning medications.  Continues to weigh daily. Weight this morning was 209 pounds and denies any lower extremity edema.  Appointments:  Attended appointment with Dr. Burt Knack with Cardiology on 11/19/2019.  Plan: RN Health Coach will make next telephone outreach to patient within the month of November and patient agrees to future outreach.  [place] [phone] [name]

## 2019-12-03 DIAGNOSIS — Z79891 Long term (current) use of opiate analgesic: Secondary | ICD-10-CM | POA: Diagnosis not present

## 2019-12-03 DIAGNOSIS — G894 Chronic pain syndrome: Secondary | ICD-10-CM | POA: Diagnosis not present

## 2019-12-03 DIAGNOSIS — M150 Primary generalized (osteo)arthritis: Secondary | ICD-10-CM | POA: Diagnosis not present

## 2019-12-03 DIAGNOSIS — M65312 Trigger thumb, left thumb: Secondary | ICD-10-CM | POA: Diagnosis not present

## 2019-12-04 ENCOUNTER — Telehealth: Payer: Self-pay | Admitting: Pharmacist

## 2019-12-04 NOTE — Chronic Care Management (AMB) (Signed)
Received call from patient asking about blood sugars.  Pt reports fasting BG has been higher than usual lately. She reports fasting BG this morning was 128, range 68-148. She reports BG is highest after dinner, up to low 200s. She has had 2 episodes of low BG in 60s-70s in the past 2 weeks. She is interested in restarting glipizide. Glipizide was recently stopped due to multiple episodes of hypoglycemia. We discussed risk of hypoglycemia is high with glipizide + insulin. Since highest BG is post-prandial, suggested to switch to more potent GLP-1 to target post-prandial BG. Pt is amenable.  Plan: Recommend switch from Victoza 1.8 mg daily to Ozempic 1 mg weekly (patient already enrolled in [employer] PAP)

## 2019-12-10 ENCOUNTER — Telehealth: Payer: Medicare Other

## 2019-12-10 ENCOUNTER — Ambulatory Visit (INDEPENDENT_AMBULATORY_CARE_PROVIDER_SITE_OTHER): Payer: Medicare Other | Admitting: General Practice

## 2019-12-10 ENCOUNTER — Other Ambulatory Visit: Payer: Self-pay

## 2019-12-10 DIAGNOSIS — D510 Vitamin B12 deficiency anemia due to intrinsic factor deficiency: Secondary | ICD-10-CM

## 2019-12-10 DIAGNOSIS — Z7901 Long term (current) use of anticoagulants: Secondary | ICD-10-CM | POA: Diagnosis not present

## 2019-12-10 DIAGNOSIS — I480 Paroxysmal atrial fibrillation: Secondary | ICD-10-CM

## 2019-12-10 DIAGNOSIS — Z23 Encounter for immunization: Secondary | ICD-10-CM

## 2019-12-10 DIAGNOSIS — E538 Deficiency of other specified B group vitamins: Secondary | ICD-10-CM

## 2019-12-10 LAB — POCT INR: INR: 2.7 (ref 2.0–3.0)

## 2019-12-10 MED ORDER — CYANOCOBALAMIN 1000 MCG/ML IJ SOLN
1000.0000 ug | Freq: Once | INTRAMUSCULAR | Status: AC
  Administered 2020-01-21: 1000 ug via INTRAMUSCULAR

## 2019-12-10 NOTE — Patient Instructions (Addendum)
Pre visit review using our clinic review tool, if applicable. No additional management support is needed unless otherwise documented below in the visit note.  Continue to take 1 tablet daily except 2 tablets on Mon and Fridays. Re-check in 6 weeks.  

## 2019-12-10 NOTE — Progress Notes (Signed)
I have reviewed and agree.

## 2019-12-11 ENCOUNTER — Ambulatory Visit (INDEPENDENT_AMBULATORY_CARE_PROVIDER_SITE_OTHER): Payer: Medicare Other | Admitting: Psychiatry

## 2019-12-11 DIAGNOSIS — F4323 Adjustment disorder with mixed anxiety and depressed mood: Secondary | ICD-10-CM | POA: Diagnosis not present

## 2019-12-11 MED ORDER — ALPRAZOLAM 1 MG PO TABS: 1.0000 mg | ORAL_TABLET | Freq: Three times a day (TID) | ORAL | 5 refills | Status: DC | PRN

## 2019-12-11 NOTE — Progress Notes (Signed)
She is only fair. None Patient ID: Kristen [name], female   DOB: Feb 06, 1946, 74 y.o.   MRN: [id number] [facility] MD Progress Note  12/11/2019 2:45 PM Kristen [name]  MRN:  [id number] Subjective:  Scared Principal Problem: Adjustment disorder with an anxious mood state  Today the patient is doing very well.  She is made a decision not to have surgery.  She is scared of going to the hospital.  Her pain is well controlled with the help of Dr. Nicholaus Bloom and pain medication.  Correctly so the patient had difficulty with nonsteroid anti-inflammatory drugs and therefore needed to be on opiates.  This is perfectly appropriate and given the fact the patient takes Xanax 1 mg 3 times daily we will experience no interactions between the 2.  Patient is very responsible and reliable.  Her anxiety is well controlled on this fixed dose of Xanax that she has been on for well over a decade.  Patient is functioning very well.  She has had a vaccine.  She lives comfortably with her husband.  The issue of further treatment for her shoulder will be discussed at another point in a different setting.  Per my view she has an adjustment disorder with an anxious mood state.  Her precipitant is chronic pain the pandemic.  Patient is functioning very well.  The patient denies use of alcohol or drugs. Past Medical History:  Diagnosis Date  . Arthritis   . Blood transfusion without reported diagnosis   . Bursitis   . CAD (coronary artery disease)    a. s/p CABG 2004.b. stable cath 2014 demonstrating stable CAD and continued patency of her LIMA graft.  . Chronic anticoagulation    on coumadin  . DDD (degenerative disc disease), lumbar   . Depression   . Diabetes mellitus   . Diastolic dysfunction    per echo in October 2012 with EF 50 to 55%  . Fibromyalgia   . GERD (gastroesophageal reflux disease) 10/23/2003  . Headache(784.0)   . Hyperlipidemia   . Hypertension   . Hypokalemia   . LBBB (left bundle branch  block)   . Lumbar back pain   . LV dysfunction    a. EF 45% by cath 2014. b. EF 50-55% by technically difficult echo in 08/2014.  Marland Kitchen Lymphadenitis   . Morbid obesity ([place])    a. Sleep study negative for significant OSA in 11/2014.  Marland Kitchen PAF (paroxysmal atrial fibrillation) ([place])   . Stroke [facility]) 2004   affected speech per pt    Past Surgical History:  Procedure Laterality Date  . ABDOMINAL HYSTERECTOMY    . ANGIOPLASTY  laminectomy  . CHOLECYSTECTOMY    . CORONARY ARTERY BYPASS GRAFT     LIMA to LAD   . KNEE ARTHROSCOPY    . LEFT HEART CATHETERIZATION WITH CORONARY/GRAFT ANGIOGRAM  12/07/2012   Procedure: LEFT HEART CATHETERIZATION WITH Beatrix Fetters;  Surgeon: Blane Ohara, MD;  Location: [facility] CATH LAB;  Service: Cardiovascular;;  . LUMBAR LAMINECTOMY     x3  . SPHINCTEROTOMY    . TONSILLECTOMY     Family History:  Family History  Problem Relation Age of Onset  . Heart attack Father   . Heart disease Father   . Stroke Mother   . Kidney disease Other   . Stroke Other   . Arthritis Other   . Hypertension Other   . Diabetes Other   . Colon cancer Paternal Uncle   . Depression Sister   .  Anxiety disorder Maternal Aunt    Family Psychiatric  History:  Social History:  Social History   Substance and Sexual Activity  Alcohol Use No     Social History   Substance and Sexual Activity  Drug Use No    Social History   Socioeconomic History  . Marital status: Married    Spouse name: Not on file  . Number of children: Not on file  . Years of education: Not on file  . Highest education level: Not on file  Occupational History  . Not on file  Tobacco Use  . Smoking status: Former Smoker    Quit date: 03/07/2001    Years since quitting: 18.7  . Smokeless tobacco: Never Used  Vaping Use  . Vaping Use: Never used  Substance and Sexual Activity  . Alcohol use: No  . Drug use: No  . Sexual activity: Not Currently  Other Topics Concern  . Not on file   Social History Narrative   Lives locally, has help available if needed.   Social Determinants of Health   Financial Resource Strain: High Risk  . Difficulty of Paying Living Expenses: Hard  Food Insecurity: No Food Insecurity  . Worried About Charity fundraiser in the Last Year: Never true  . Ran Out of Food in the Last Year: Never true  Transportation Needs: No Transportation Needs  . Lack of Transportation (Medical): No  . Lack of Transportation (Non-Medical): No  Physical Activity: Inactive  . Days of Exercise per Week: 0 days  . Minutes of Exercise per Session: 0 min  Stress: No Stress Concern Present  . Feeling of Stress : Not at all  Social Connections: Moderately Isolated  . Frequency of Communication with Friends and Family: More than three times a week  . Frequency of Social Gatherings with Friends and Family: Never  . Attends Religious Services: Never  . Active Member of Clubs or Organizations: No  . Attends Archivist Meetings: Never  . Marital Status: Married   Additional Social History:                         Sleep: Fair  Appetite:  Fair  Current Medications: Current Outpatient Medications  Medication Sig Dispense Refill  . ALPRAZolam (XANAX) 1 MG tablet Take 1 tablet (1 mg total) by mouth 3 (three) times daily as needed for anxiety. 90 tablet 5  . Ascorbic Acid (VITAMIN C) 1000 MG tablet Take 1,000 mg by mouth daily.    Marland Kitchen atorvastatin (LIPITOR) 40 MG tablet TAKE 1 TABLET(40 MG) BY MOUTH DAILY 90 tablet 1  . Blood Glucose Monitoring Suppl (ONE TOUCH ULTRA 2) w/Device KIT Use as advised 1 each 0  . Cholecalciferol (VITAMIN D3) 50 MCG (2000 UT) capsule Take 2,000 Units by mouth daily.    . empagliflozin (JARDIANCE) 25 MG TABS tablet Take 25 mg by mouth daily. 90 tablet 3  . furosemide (LASIX) 20 MG tablet Take 1 tablet (20 mg total) by mouth daily. 90 tablet 1  . gabapentin (NEURONTIN) 300 MG capsule Take 1 capsule (300 mg total) by mouth  3 (three) times daily. 270 capsule 0  . glucose blood (COOL BLOOD GLUCOSE TEST STRIPS) test strip Use to test blood sugar twice daily. DX: E11.9 100 each 12  . insulin glargine, 2 Unit Dial, (TOUJEO MAX SOLOSTAR) 300 UNIT/ML Solostar Pen Inject 50 Units into the skin daily. 15 mL 5  . Insulin Pen Needle (  NOVOFINE) 32G X 6 MM MISC 1 Act by Does not apply route daily. 100 each 3  . Lancets (ONETOUCH ULTRASOFT) lancets Use as instructed 100 each 12  . liraglutide (VICTOZA) 18 MG/3ML SOPN Inject 0.3 mLs (1.8 mg total) into the skin every morning. 27 mL 1  . magnesium oxide (MAG-OX) 400 MG tablet TAKE ONE TABLET BY MOUTH BY MOUTH TWICE DAILY WITH BREAKFAST AND EVENING MEAL] 180 tablet 2  . metFORMIN (GLUCOPHAGE-XR) 500 MG 24 hr tablet Take 2 tablets (1,000 mg total) by mouth 2 (two) times daily with a meal. 360 tablet 0  . metoprolol succinate (TOPROL-XL) 100 MG 24 hr tablet Take 1 tablet (100 mg total) by mouth 2 (two) times daily. Take with or immediately following a meal. 180 tablet 3  . Multiple Vitamin (MULTIVITAMIN) tablet Take 1 tablet by mouth daily.    . nitroGLYCERIN (NITROSTAT) 0.4 MG SL tablet Place 1 tablet (0.4 mg total) under the tongue every 5 (five) minutes as needed for chest pain (x 3 doses). Reported on 05/08/2015 35 tablet 2  . ondansetron (ZOFRAN-ODT) 4 MG disintegrating tablet Take 1 tablet (4 mg total) by mouth every 8 (eight) hours as needed for nausea or vomiting. 60 tablet 2  . PERCOCET 10-325 MG tablet Take 1 tablet by mouth 5 (five) times daily as needed.    . potassium chloride SA (KLOR-CON) 20 MEQ tablet Take 1 tablet (20 mEq total) by mouth 2 (two) times daily. 180 tablet 3  . spironolactone (ALDACTONE) 25 MG tablet Take 1 tablet (25 mg total) by mouth 2 (two) times daily. (Patient taking differently: Take 25 mg by mouth daily. ) 180 tablet 1  . telmisartan (MICARDIS) 40 MG tablet Take 1 tablet (40 mg total) by mouth daily. 90 tablet 1  . thiamine 100 MG tablet Take 1 tablet  (100 mg total) by mouth daily. 90 tablet 1  . triamcinolone cream (KENALOG) 0.5 % Apply 1 application topically 3 (three) times daily. 60 g 3  . warfarin (COUMADIN) 3 MG tablet TAKE ONE TABLET BY MOUTH DAILY OR AS DIRECTED by anticoagulation clinic 60 tablet 2   Current Facility-Administered Medications  Medication Dose Route Frequency Provider Last Rate Last Admin  . cyanocobalamin ((VITAMIN B-12)) injection 1,000 mcg  1,000 mcg Intramuscular Once [name], MD        Lab Results:  Results for orders placed or performed in visit on 12/10/19 (from the past 48 hour(s))  POCT INR     Status: None   Collection Time: 12/10/19 12:00 AM  Result Value Ref Range   INR 2.7 2.0 - 3.0   *Note: Due to a large number of results and/or encounters for the requested time period, some results have not been displayed. A complete set of results can be found in Results Review.    Physical Findings: AIMS:  , ,  ,  ,    CIWA:    COWS:     Musculoskeletal: Strength & Muscle Tone: within normal limits Gait & Station: normal Patient leans: Right  Psychiatric Specialty Exam: ROS  There were no vitals taken for this visit.There is no height or weight on file to calculate BMI.  General Appearance: NA  Eye Contact::  Good  Speech:  Clear and Coherent  Volume:  Normal  Mood:  NA  Affect:  Congruent  Thought Process:  Coherent  Orientation:  Full (Time, Place, and Person)  Thought Content:  WDL  Suicidal Thoughts:  No  Homicidal  Thoughts:  No  Memory:  NA  Judgement:  Good  Insight:  Good  Psychomotor Activity:  Normal  Concentration:  Good  Recall:  Good  Fund of Knowledge:Fair  Language: Good  Akathisia:  No  Handed:  Left  AIMS (if indicated):     Assets:  Desire for Improvement  ADL's:  Intact  Cognition: WNL  Sleep:      This patient's #1 problem is an adjustment disorder with an anxious mood state.  She will continue taking Xanax 1 mg 3 times daily.  She is not oversedated she  has had no falls and she gets along very well.  Her thoughts are clear organized and connected.  She will return to see me in approximately 4 months.

## 2019-12-12 ENCOUNTER — Telehealth: Payer: Self-pay | Admitting: Pharmacist

## 2019-12-12 NOTE — Progress Notes (Signed)
Chronic Care Management Pharmacy Assistant   Name: Syria Kestner  MRN: [id number] DOB: [birth year]  Reason for Encounter: Medication Review  PCP : [name], MD  Allergies:   Allergies  Allergen Reactions  . Metformin And Related Other (See Comments)    Must take XR form only, cannot tolerate Regular release metformin  . Cleocin [Clindamycin Hcl] Diarrhea  . Codeine Itching  . Doxycycline Hyclate Diarrhea  . Macrolides And Ketolides Diarrhea  . Morphine Hives  . Pentazocine Lactate Itching and Nausea And Vomiting    (GENERIC- [place])  . Vibramycin [Doxycycline Calcium] Diarrhea and Rash  . Clindamycin Diarrhea  . Definity [Perflutren Lipid Microsphere] Other (See Comments)    Patient complained of warm sensation in chest x few seconds duration when injected Definity.No other symptoms  . Doxycycline Diarrhea  . Metformin Other (See Comments)  . Pentazocine Nausea Only  . Perflutren Other (See Comments)  . Perflutren Lipid Microspheres Other (See Comments)  . Sulfa Antibiotics Diarrhea and Rash  . Sulfonamide Derivatives Rash    Medications: Outpatient Encounter Medications as of 12/12/2019  Medication Sig  . ALPRAZolam (XANAX) 1 MG tablet Take 1 tablet (1 mg total) by mouth 3 (three) times daily as needed for anxiety.  . Ascorbic Acid (VITAMIN C) 1000 MG tablet Take 1,000 mg by mouth daily.  Marland Kitchen atorvastatin (LIPITOR) 40 MG tablet TAKE 1 TABLET(40 MG) BY MOUTH DAILY  . Blood Glucose Monitoring Suppl (ONE TOUCH ULTRA 2) w/Device KIT Use as advised  . Cholecalciferol (VITAMIN D3) 50 MCG (2000 UT) capsule Take 2,000 Units by mouth daily.  . empagliflozin (JARDIANCE) 25 MG TABS tablet Take 25 mg by mouth daily.  . furosemide (LASIX) 20 MG tablet Take 1 tablet (20 mg total) by mouth daily.  Marland Kitchen gabapentin (NEURONTIN) 300 MG capsule Take 1 capsule (300 mg total) by mouth 3 (three) times daily.  Marland Kitchen glucose blood (COOL BLOOD GLUCOSE TEST STRIPS) test strip Use to test  blood sugar twice daily. DX: E11.9  . insulin glargine, 2 Unit Dial, (TOUJEO MAX SOLOSTAR) 300 UNIT/ML Solostar Pen Inject 50 Units into the skin daily.  . Insulin Pen Needle (NOVOFINE) 32G X 6 MM MISC 1 Act by Does not apply route daily.  . Lancets (ONETOUCH ULTRASOFT) lancets Use as instructed  . liraglutide (VICTOZA) 18 MG/3ML SOPN Inject 0.3 mLs (1.8 mg total) into the skin every morning.  . magnesium oxide (MAG-OX) 400 MG tablet TAKE ONE TABLET BY MOUTH BY MOUTH TWICE DAILY WITH BREAKFAST AND EVENING MEAL]  . metFORMIN (GLUCOPHAGE-XR) 500 MG 24 hr tablet Take 2 tablets (1,000 mg total) by mouth 2 (two) times daily with a meal.  . metoprolol succinate (TOPROL-XL) 100 MG 24 hr tablet Take 1 tablet (100 mg total) by mouth 2 (two) times daily. Take with or immediately following a meal.  . Multiple Vitamin (MULTIVITAMIN) tablet Take 1 tablet by mouth daily.  . nitroGLYCERIN (NITROSTAT) 0.4 MG SL tablet Place 1 tablet (0.4 mg total) under the tongue every 5 (five) minutes as needed for chest pain (x 3 doses). Reported on 05/08/2015  . ondansetron (ZOFRAN-ODT) 4 MG disintegrating tablet Take 1 tablet (4 mg total) by mouth every 8 (eight) hours as needed for nausea or vomiting.  Marland Kitchen PERCOCET 10-325 MG tablet Take 1 tablet by mouth 5 (five) times daily as needed.  . potassium chloride SA (KLOR-CON) 20 MEQ tablet Take 1 tablet (20 mEq total) by mouth 2 (two) times daily.  Marland Kitchen spironolactone (ALDACTONE)  25 MG tablet Take 1 tablet (25 mg total) by mouth 2 (two) times daily. (Patient taking differently: Take 25 mg by mouth daily. )  . telmisartan (MICARDIS) 40 MG tablet Take 1 tablet (40 mg total) by mouth daily.  Marland Kitchen thiamine 100 MG tablet Take 1 tablet (100 mg total) by mouth daily.  Marland Kitchen triamcinolone cream (KENALOG) 0.5 % Apply 1 application topically 3 (three) times daily.  Marland Kitchen warfarin (COUMADIN) 3 MG tablet TAKE ONE TABLET BY MOUTH DAILY OR AS DIRECTED by anticoagulation clinic  . [DISCONTINUED] atorvastatin  (LIPITOR) 40 MG tablet Take 1 tablet (40 mg total) by mouth daily.   Facility-Administered Encounter Medications as of 12/12/2019  Medication  . cyanocobalamin ((VITAMIN B-12)) injection 1,000 mcg    Current Diagnosis: Patient Active Problem List   Diagnosis Date Noted  . Thiamine deficiency 12/30/2018  . Vitamin B12 deficiency anemia due to intrinsic factor deficiency 12/20/2018  . CHF (congestive heart failure), NYHA class I, chronic, combined ([place]) 12/19/2018  . Coronary artery disease involving native coronary artery of native heart with angina pectoris ([place]) 12/19/2018  . Obesity with body mass index (BMI) of 30.0 to 39.9 01/15/2018  . Chronic idiopathic constipation 12/27/2017  . Atrial fibrillation ([place]) 10/30/2017  . Type 2 diabetes mellitus ([place]) 10/30/2017  . Eczema 08/07/2017  . Insomnia secondary to chronic pain 08/04/2016  . Visit for screening mammogram 04/14/2016  . LV dysfunction   . Routine general medical examination at a health care facility 05/03/2013  . DM (diabetes mellitus), type 2 with peripheral vascular complications ([place]) 59/74/1638  . PVC's/nonsustained VT 10/24/2012  . H/O: CVA (cerebrovascular accident) 02/09/2012  . Hypothyroidism 05/27/2011  . Fibromyalgia 10/08/2010  . Long term current use of anticoagulant 05/05/2010  . Low back pain, non-specific 05/12/2008  . Cognitive dysfunction associated with depression 01/29/2007  . Osteoarthritis 11/22/2006  . Hyperlipidemia associated with type 2 diabetes mellitus ([place]) 09/13/2006  . Coronary atherosclerosis 09/13/2006  . Paroxysmal atrial fibrillation ([place]) 09/13/2006  . GERD 09/13/2006  . Hypertension associated with diabetes ([place]) 08/30/2006    Goals Addressed   None     Follow-Up:  Pharmacist Review  Received call from patient regarding medication management via [facility].  Patient requested an acute fill for Percocet 10-353m, Alprazolam 111m to be delivered: 12/13/2019 Pharmacy  needs refills? No  Confirmed delivery date of 12/13/2019, advised patient that pharmacy will contact them the morning of delivery.  DeRosendo GrosNRMedical Eye Associates IncPractice Team Manager/ CPA (Clinical Pharmacist Assistant) [phone]

## 2019-12-20 ENCOUNTER — Telehealth: Payer: Medicare Other

## 2019-12-21 ENCOUNTER — Other Ambulatory Visit: Payer: Self-pay | Admitting: Internal Medicine

## 2019-12-21 DIAGNOSIS — E1169 Type 2 diabetes mellitus with other specified complication: Secondary | ICD-10-CM

## 2019-12-21 DIAGNOSIS — E1151 Type 2 diabetes mellitus with diabetic peripheral angiopathy without gangrene: Secondary | ICD-10-CM

## 2019-12-21 DIAGNOSIS — E1159 Type 2 diabetes mellitus with other circulatory complications: Secondary | ICD-10-CM

## 2019-12-21 DIAGNOSIS — E785 Hyperlipidemia, unspecified: Secondary | ICD-10-CM

## 2019-12-21 DIAGNOSIS — I2510 Atherosclerotic heart disease of native coronary artery without angina pectoris: Secondary | ICD-10-CM

## 2019-12-23 ENCOUNTER — Telehealth: Payer: Self-pay | Admitting: Pharmacist

## 2019-12-23 NOTE — Progress Notes (Signed)
A user error has taken place: encounter opened in error, closed for administrative reasons.

## 2019-12-23 NOTE — Progress Notes (Signed)
Chronic Care Management Pharmacy Assistant   Name: Dalissa Lovin  MRN: [id number] DOB: 1945-06-06  Reason for Encounter: Medication Review  Patient Questions:  1.  Have you seen any other providers since your last visit? No  2.  Any changes in your medicines or health? No    PCP : [name], MD  Allergies:   Allergies  Allergen Reactions  . Metformin And Related Other (See Comments)    Must take XR form only, cannot tolerate Regular release metformin  . Cleocin [Clindamycin Hcl] Diarrhea  . Codeine Itching  . Doxycycline Hyclate Diarrhea  . Macrolides And Ketolides Diarrhea  . Morphine Hives  . Pentazocine Lactate Itching and Nausea And Vomiting    (GENERIC- [place])  . Vibramycin [Doxycycline Calcium] Diarrhea and Rash  . Clindamycin Diarrhea  . Definity [Perflutren Lipid Microsphere] Other (See Comments)    Patient complained of warm sensation in chest x few seconds duration when injected Definity.No other symptoms  . Doxycycline Diarrhea  . Metformin Other (See Comments)  . Pentazocine Nausea Only  . Perflutren Other (See Comments)  . Perflutren Lipid Microspheres Other (See Comments)  . Sulfa Antibiotics Diarrhea and Rash  . Sulfonamide Derivatives Rash    Medications: Outpatient Encounter Medications as of 12/23/2019  Medication Sig  . ALPRAZolam (XANAX) 1 MG tablet Take 1 tablet (1 mg total) by mouth 3 (three) times daily as needed for anxiety.  . Ascorbic Acid (VITAMIN C) 1000 MG tablet Take 1,000 mg by mouth daily.  Marland Kitchen atorvastatin (LIPITOR) 40 MG tablet TAKE ONE TABLET BY MOUTH DAILY  . Blood Glucose Monitoring Suppl (ONE TOUCH ULTRA 2) w/Device KIT Use as advised  . Cholecalciferol (VITAMIN D3) 50 MCG (2000 UT) capsule Take 2,000 Units by mouth daily.  . empagliflozin (JARDIANCE) 25 MG TABS tablet Take 25 mg by mouth daily.  . furosemide (LASIX) 20 MG tablet Take 1 tablet (20 mg total) by mouth daily.  Marland Kitchen gabapentin (NEURONTIN) 300 MG capsule  Take 1 capsule (300 mg total) by mouth 3 (three) times daily.  Marland Kitchen glucose blood (COOL BLOOD GLUCOSE TEST STRIPS) test strip Use to test blood sugar twice daily. DX: E11.9  . insulin glargine, 2 Unit Dial, (TOUJEO MAX SOLOSTAR) 300 UNIT/ML Solostar Pen Inject 50 Units into the skin daily.  . Insulin Pen Needle (NOVOFINE) 32G X 6 MM MISC 1 Act by Does not apply route daily.  . Lancets (ONETOUCH ULTRASOFT) lancets Use as instructed  . liraglutide (VICTOZA) 18 MG/3ML SOPN Inject 0.3 mLs (1.8 mg total) into the skin every morning.  . magnesium oxide (MAG-OX) 400 MG tablet TAKE ONE TABLET BY MOUTH BY MOUTH TWICE DAILY WITH BREAKFAST AND EVENING MEAL]  . metFORMIN (GLUCOPHAGE-XR) 500 MG 24 hr tablet Take 2 tablets (1,000 mg total) by mouth 2 (two) times daily with a meal.  . metoprolol succinate (TOPROL-XL) 100 MG 24 hr tablet Take 1 tablet (100 mg total) by mouth 2 (two) times daily. Take with or immediately following a meal.  . Multiple Vitamin (MULTIVITAMIN) tablet Take 1 tablet by mouth daily.  . nitroGLYCERIN (NITROSTAT) 0.4 MG SL tablet Place 1 tablet (0.4 mg total) under the tongue every 5 (five) minutes as needed for chest pain (x 3 doses). Reported on 05/08/2015  . ondansetron (ZOFRAN-ODT) 4 MG disintegrating tablet Take 1 tablet (4 mg total) by mouth every 8 (eight) hours as needed for nausea or vomiting.  Marland Kitchen PERCOCET 10-325 MG tablet Take 1 tablet by mouth 5 (five)  times daily as needed.  . potassium chloride SA (KLOR-CON) 20 MEQ tablet Take 1 tablet (20 mEq total) by mouth 2 (two) times daily.  Marland Kitchen spironolactone (ALDACTONE) 25 MG tablet Take 1 tablet (25 mg total) by mouth 2 (two) times daily. (Patient taking differently: Take 25 mg by mouth daily. )  . telmisartan (MICARDIS) 40 MG tablet TAKE ONE TABLET BY MOUTH DAILY  . thiamine 100 MG tablet Take 1 tablet (100 mg total) by mouth daily.  Marland Kitchen triamcinolone cream (KENALOG) 0.5 % Apply 1 application topically 3 (three) times daily.  Marland Kitchen warfarin  (COUMADIN) 3 MG tablet TAKE ONE TABLET BY MOUTH DAILY OR AS DIRECTED by anticoagulation clinic  . [DISCONTINUED] atorvastatin (LIPITOR) 40 MG tablet Take 1 tablet (40 mg total) by mouth daily.  . [DISCONTINUED] atorvastatin (LIPITOR) 40 MG tablet TAKE 1 TABLET(40 MG) BY MOUTH DAILY  . [DISCONTINUED] telmisartan (MICARDIS) 40 MG tablet Take 1 tablet (40 mg total) by mouth daily.   Facility-Administered Encounter Medications as of 12/23/2019  Medication  . cyanocobalamin ((VITAMIN B-12)) injection 1,000 mcg    Current Diagnosis: Patient Active Problem List   Diagnosis Date Noted  . Thiamine deficiency 12/30/2018  . Vitamin B12 deficiency anemia due to intrinsic factor deficiency 12/20/2018  . CHF (congestive heart failure), NYHA class I, chronic, combined ([place]) 12/19/2018  . Coronary artery disease involving native coronary artery of native heart with angina pectoris ([place]) 12/19/2018  . Obesity with body mass index (BMI) of 30.0 to 39.9 01/15/2018  . Chronic idiopathic constipation 12/27/2017  . Atrial fibrillation ([place]) 10/30/2017  . Type 2 diabetes mellitus ([place]) 10/30/2017  . Eczema 08/07/2017  . Insomnia secondary to chronic pain 08/04/2016  . Visit for screening mammogram 04/14/2016  . LV dysfunction   . Routine general medical examination at a health care facility 05/03/2013  . DM (diabetes mellitus), type 2 with peripheral vascular complications ([place]) 35/32/9924  . PVC's/nonsustained VT 10/24/2012  . H/O: CVA (cerebrovascular accident) 02/09/2012  . Hypothyroidism 05/27/2011  . Fibromyalgia 10/08/2010  . Long term current use of anticoagulant 05/05/2010  . Low back pain, non-specific 05/12/2008  . Cognitive dysfunction associated with depression 01/29/2007  . Osteoarthritis 11/22/2006  . Hyperlipidemia associated with type 2 diabetes mellitus ([place]) 09/13/2006  . Coronary atherosclerosis 09/13/2006  . Paroxysmal atrial fibrillation ([place]) 09/13/2006  . GERD 09/13/2006  .  Hypertension associated with diabetes ([place]) 08/30/2006    Goals Addressed   None     Follow-Up:  Coordination of Enhanced Pharmacy Services  Reviewed chart for medication changes ahead of medication coordination call.  No OVs, Consults, or hospital visits since last care coordination call/Pharmacist visit.  No medication changes indicated  BP Readings from Last 3 Encounters:  11/19/19 104/66  10/16/19 112/72  10/10/19 120/70    Lab Results  Component Value Date   HGBA1C 8.3 (H) 10/10/2019     Patient obtains medications through Vials  90 Days   Last adherence delivery included: Spironolactone 25 mg; 1 tab bid Warfarin 3 mg; take $Remove'6mg'YwKEFdJ$  Mon and Friday and $RemoveBe'3mg'LguuhyNVZ$  all other days Cyclobenzaprine 10 mg; 1 tab 3x daily as needed Potassium Cl Re 20 meq; 1 tab bid-breakfast and bedtime Metformin Re 500 mg; take 2 tabs twice daily with meals  One touch Ultra lancets; use as directed  Mag oxide 400 mg; 1 tab twice daily-breakfast and evening Metoprolol Succinate Er 100 mg; 1 tab twice daily-breakfast and bedtime Telmisartan 40 mg; 1 tab daily Atorvastatin 40 mg; 1 tab daily Gabapentin  300 mg; 1 cap 3x daily-breakfast, lunch evening  Patient is due for next adherence delivery on:12/31/19. Called patient and reviewed medications and coordinated delivery.  This delivery to include: Spironolactone 25 mg; 1 tab bid Warfarin 3 mg; take $Remove'6mg'WRgzwVR$  Mon and Friday and $RemoveBe'3mg'nJLMelFYp$  all other days Cyclobenzaprine 10 mg; 1 tab 3x daily as needed Potassium Cl Re 20 meq; 1 tab bid-breakfast and bedtime Metformin Re 500 mg; take 2 tabs twice daily with meals  One touch Ultra lancets; use as directed  Mag oxide 400 mg; 1 tab twice daily-breakfast and evening Metoprolol Succinate Er 100 mg; 1 tab twice daily-breakfast and bedtime Telmisartan 40 mg; 1 tab daily Atorvastatin 40 mg; 1 tab daily Gabapentin 300 mg; 1 cap 3x daily-breakfast, lunch evening   Patient will need a short fill of  Warfarin 3 mg and One  touch Ultra Lancets prior to adherence delivery.  Coordinated acute fill for Warfarin 3 mg and One touch Ultra lancets to be delivered 12/27/19. Patient declined the following medications Percocet and Alprazolam due to the reason that she has enough on hand until November. Patient does not need any test strips. Patient has an abundance of Lasix on hand   Confirmed delivery date of 12/31/19 advised patient that pharmacy will contact them the morning of delivery.       Rosendo Gros, [facility]  Practice Team Manager/ CPA (Clinical Pharmacist Assistant) [phone]

## 2019-12-24 ENCOUNTER — Other Ambulatory Visit: Payer: Self-pay

## 2019-12-24 ENCOUNTER — Ambulatory Visit
Admission: RE | Admit: 2019-12-24 | Discharge: 2019-12-24 | Disposition: A | Discharge status: Home / Self Care | Payer: Medicare Other | Source: Ambulatory Visit | Attending: Internal Medicine | Admitting: Internal Medicine

## 2019-12-24 DIAGNOSIS — Z1231 Encounter for screening mammogram for malignant neoplasm of breast: Secondary | ICD-10-CM | POA: Diagnosis not present

## 2019-12-27 LAB — HM MAMMOGRAPHY

## 2019-12-30 ENCOUNTER — Other Ambulatory Visit: Payer: Self-pay | Admitting: Internal Medicine

## 2019-12-30 ENCOUNTER — Ambulatory Visit: Payer: Medicare Other | Admitting: Cardiovascular Disease

## 2019-12-30 DIAGNOSIS — M797 Fibromyalgia: Secondary | ICD-10-CM

## 2019-12-30 DIAGNOSIS — E1151 Type 2 diabetes mellitus with diabetic peripheral angiopathy without gangrene: Secondary | ICD-10-CM

## 2019-12-30 DIAGNOSIS — Z7901 Long term (current) use of anticoagulants: Secondary | ICD-10-CM

## 2019-12-31 DIAGNOSIS — M150 Primary generalized (osteo)arthritis: Secondary | ICD-10-CM | POA: Diagnosis not present

## 2019-12-31 DIAGNOSIS — M65312 Trigger thumb, left thumb: Secondary | ICD-10-CM | POA: Diagnosis not present

## 2019-12-31 DIAGNOSIS — Z79891 Long term (current) use of opiate analgesic: Secondary | ICD-10-CM | POA: Diagnosis not present

## 2019-12-31 DIAGNOSIS — G894 Chronic pain syndrome: Secondary | ICD-10-CM | POA: Diagnosis not present

## 2020-01-03 NOTE — Progress Notes (Signed)
Patient is switching Victoza to [employer] via [employer] patient assistance. Refill form has been faxed to [employer] as of 12/24/19. Awaiting delivery of Ozempic to office.

## 2020-01-06 ENCOUNTER — Ambulatory Visit: Payer: Medicare Other | Admitting: Pharmacist

## 2020-01-06 ENCOUNTER — Other Ambulatory Visit: Payer: Self-pay

## 2020-01-06 DIAGNOSIS — E1149 Type 2 diabetes mellitus with other diabetic neurological complication: Secondary | ICD-10-CM

## 2020-01-06 DIAGNOSIS — E1169 Type 2 diabetes mellitus with other specified complication: Secondary | ICD-10-CM

## 2020-01-06 DIAGNOSIS — I480 Paroxysmal atrial fibrillation: Secondary | ICD-10-CM

## 2020-01-06 DIAGNOSIS — Z794 Long term (current) use of insulin: Secondary | ICD-10-CM

## 2020-01-06 DIAGNOSIS — I152 Hypertension secondary to endocrine disorders: Secondary | ICD-10-CM

## 2020-01-06 DIAGNOSIS — I5042 Chronic combined systolic (congestive) and diastolic (congestive) heart failure: Secondary | ICD-10-CM

## 2020-01-06 DIAGNOSIS — E1159 Type 2 diabetes mellitus with other circulatory complications: Secondary | ICD-10-CM

## 2020-01-06 DIAGNOSIS — E785 Hyperlipidemia, unspecified: Secondary | ICD-10-CM

## 2020-01-06 NOTE — Patient Instructions (Signed)
Visit Information  Phone number for Pharmacist: [phone]  Goals Addressed            This Visit's Progress   . Pharmacy Care Plan       CARE PLAN ENTRY  Current Barriers:  . Chronic Disease Management support, education, and care coordination needs related to Hypertension, Hyperlipidemia, Diabetes, Atrial Fibrillation, and Heart Failure   Hypertension/Heart Failure BP Readings from Last 3 Encounters:  11/19/19 104/66  10/16/19 112/72  10/10/19 120/70 .  Pharmacist Clinical Goal(s): o Over the next 90 days, patient will work with PharmD and providers to maintain BP goal <130/80 . Current regimen:  o metoprolol succinate 100 mg twice daily  o telmisartan 40 mg daily,  o spironolactone 25 mg daily,  o furosemide 20 mg daily,  o potassium chloride 20 meq twice daily o Nitroglycerin 0.4 mg as needed . Interventions: o Discussed medications at length and how they work together to achieve BP goals o Discussed benefits of maintaining BP at goal . Patient self care activities - Over the next 90 days, patient will: o Ensure daily salt intake < 2300 mg/day  Hyperlipidemia Lab Results  Component Value Date/Time   LDLCALC 65 04/11/2019 02:55 PM   [place] 48 12/26/2017 07:58 AM   LDLDIRECT 116.0 11/28/2008 11:22 AM .  Pharmacist Clinical Goal(s): o Over the next 90 days, patient will work with PharmD and providers to maintain LDL goal < 70 . Current regimen:  o Atorvastatin 40 mg daily . Interventions: o Discussed dietary recommendations to maintain LDL goals, including cutting back on fried foods . Patient self care activities - Over the next 90 days, patient will: o Reduce fried fish in diet  Diabetes Lab Results  Component Value Date/Time   HGBA1C 8.3 (H) 10/10/2019 03:46 PM   HGBA1C 9.0 (A) 04/11/2019 02:39 PM   HGBA1C 9.0 (A) 12/19/2018 04:44 PM   HGBA1C 8.4 (H) 08/07/2017 02:52 PM .  Pharmacist Clinical Goal(s): o Over the next 90 days, patient will work with  PharmD and providers to achieve A1c goal <8% . Current regimen:  o Jardiance 25 mg daily (via [employer]) o metformin ER 1000 mg twice daily o Toujeo Max 50 units daily (via [employer]) o Ozempic 1 mg weekly (via [employer]) . Interventions: o Patient assistance approved for all brand-name medications o Discussed relative benefits of Ozempic over Victoza for blood glucose and weight loss o Counseled how to apply and operate Freestyle Libre CGM - not covered by insurance so not sustainable at this time; reassess in 2022 . Patient self care activities - Over the next 90 days, patient will: o Check blood sugar twice daily, in the morning before eating or drinking, and at bedtime, document, and provide at future appointments o Contact provider with any episodes of hypoglycemia o Reduce carbohydrates - bread, pasta, rice, potatoes  Atrial Fibrillation . Pharmacist Clinical Goal(s) o Over the next 90 days, patient will work with PharmD and providers to optimize anticoagulation regimen . Current regimen:  o metoprolol succinate 100 mg twice a day  o warfarin 3 mg AD . Interventions: o Discussed Lovenox bridge before shoulder surgery (TBD) - Lovenox is non-preferred drug with insurance but during coverage gap patient receives 75% discount on generic drugs, bringing price to $30-40 . Patient self care activities - Over the next 90 days, patient will: o Continue to follow with warfarin clinic for INR checks and dose changes o Follow surgical and warfarin clinic nurse instructions regarding Lovenox bridging  for possible shoulder surgery o Inform pharmacist and providers of medication or diet changes o Contact pharmacist with any medication access issues  Medication management . Pharmacist Clinical Goal(s): o Over the next 90 days, patient will work with PharmD and providers to maintain optimal medication adherence . Current pharmacy: [facility] . Interventions o Comprehensive medication review  performed. o Utilize [facility] pharmacy for medication synchronization, packaging and delivery . Patient self care activities - Over the next 90 days, patient will: o Focus on medication adherence by pill box o Take medications as prescribed o Report any questions or concerns to PharmD and/or provider(s)  Please see past updates related to this goal by clicking on the "Past Updates" button in the selected goal        Patient verbalizes understanding of instructions provided today.  Telephone follow up appointment with pharmacy team member scheduled for: 3 months  Charlene Brooke, PharmD, [facility] Clinical Pharmacist [facility] [phone]

## 2020-01-06 NOTE — Chronic Care Management (AMB) (Signed)
Chronic Care Management Pharmacy  Name: Kristen [name]  MRN: [id number] DOB: 12/23/1945    Chief Complaint/ HPI  [name],  74 y.o. , female presents for their Follow-Up CCM visit with the clinical pharmacist via telephone due to COVID-19 Pandemic.  PCP : [name], MD  Patient Care Team: [name], MD as PCP - General (Internal Medicine) Sherren Mocha, MD as PCP - Cardiology (Cardiology) Deboraha Sprang, MD as Consulting Physician (Cardiology) Leona Singleton, RN as [place] Management [place], [place], [facility] as Pharmacist (Pharmacist) Shon Hough, MD as Consulting Physician (Ophthalmology)  Their chronic conditions include: T2DM, HTN, Afib, CHF, CAD, GERD, HLD, fibromyalgia, insomnia  Patient lives with husband. Retired 2005 after injury at work. She has been essentially home-bound since April 2020 due to pandemic.   Office Visits: 10/16/19 Dr Ronnald Ramp OV: bicarb now normal, no evidence of ketones.  10/10/19 Dr Ronnald Ramp OV: rx'd Freestyle Libre CGM. Bicarb is low, advised to RTC to check for ketonuria.  05/08/19 Mickel Baas FNP: allergic reaction to COVID vaccine f/u.  05/06/19 Pt message: requesting 30 day supply of Toujeo d/t cost 04/11/19 Dr Ronnald Ramp OV: wt gain, poor med compliance. Rec'd to stop glipizide and be more compliant with Victoza, SGLT-2 and insulin. Follows with cardiology for CHF.  Consult Visit:  12/11/19 Dr Casimiro Needle (psychiatry): stable, no med changes.  11/19/19 Dr Burt Knack (cardiology): f/u for CAD, PAF, HTN, HLD, No med changes  03/12/19 Dr Casimiro Needle (psychiatry): very stable, no med changes.  Allergies  Allergen Reactions  . Metformin And Related Other (See Comments)    Must take XR form only, cannot tolerate Regular release metformin  . Cleocin [Clindamycin Hcl] Diarrhea  . Codeine Itching  . Doxycycline Hyclate Diarrhea  . Macrolides And Ketolides Diarrhea  . Morphine Hives  . Pentazocine Lactate Itching and  Nausea And Vomiting    (GENERIC- Talwin)  . Vibramycin [Doxycycline Calcium] Diarrhea and Rash  . Clindamycin Diarrhea  . Definity [Perflutren Lipid Microsphere] Other (See Comments)    Patient complained of warm sensation in chest x few seconds duration when injected Definity.No other symptoms  . Doxycycline Diarrhea  . Metformin Other (See Comments)  . Pentazocine Nausea Only  . Perflutren Other (See Comments)  . Perflutren Lipid Microspheres Other (See Comments)  . Sulfa Antibiotics Diarrhea and Rash  . Sulfonamide Derivatives Rash   Medications: Outpatient Encounter Medications as of 01/06/2020  Medication Sig Note  . ALPRAZolam (XANAX) 1 MG tablet Take 1 tablet (1 mg total) by mouth 3 (three) times daily as needed for anxiety.   . Ascorbic Acid (VITAMIN C) 1000 MG tablet Take 1,000 mg by mouth daily.   Marland Kitchen atorvastatin (LIPITOR) 40 MG tablet TAKE ONE TABLET BY MOUTH DAILY   . Blood Glucose Monitoring Suppl (ONE TOUCH ULTRA 2) w/Device KIT Use as advised   . Cholecalciferol (VITAMIN D3) 50 MCG (2000 UT) capsule Take 2,000 Units by mouth daily.   . empagliflozin (JARDIANCE) 25 MG TABS tablet Take 25 mg by mouth daily.   . furosemide (LASIX) 20 MG tablet Take 1 tablet (20 mg total) by mouth daily.   Marland Kitchen gabapentin (NEURONTIN) 300 MG capsule TAKE ONE CAPSULE BY MOUTH THREE TIMES DAILY   . glucose blood (COOL BLOOD GLUCOSE TEST STRIPS) test strip Use to test blood sugar twice daily. DX: E11.9   . insulin glargine, 2 Unit Dial, (TOUJEO MAX SOLOSTAR) 300 UNIT/ML Solostar Pen Inject 50 Units into the skin daily.   Marland Kitchen  Insulin Pen Needle (NOVOFINE) 32G X 6 MM MISC 1 Act by Does not apply route daily.   . Lancets (ONETOUCH ULTRASOFT) lancets Use as instructed   . liraglutide (VICTOZA) 18 MG/3ML SOPN Inject 0.3 mLs (1.8 mg total) into the skin every morning.   . magnesium oxide (MAG-OX) 400 MG tablet TAKE ONE TABLET BY MOUTH BY MOUTH TWICE DAILY WITH BREAKFAST AND EVENING MEAL]   . metFORMIN  (GLUCOPHAGE-XR) 500 MG 24 hr tablet TAKE TWO TABLETS (1,$RemoveBefore'000mg'[id number]$  total) BY MOUTH TWICE DAILY WITH A MEAL   . metoprolol succinate (TOPROL-XL) 100 MG 24 hr tablet Take 1 tablet (100 mg total) by mouth 2 (two) times daily. Take with or immediately following a meal.   . Multiple Vitamin (MULTIVITAMIN) tablet Take 1 tablet by mouth daily.   . nitroGLYCERIN (NITROSTAT) 0.4 MG SL tablet Place 1 tablet (0.4 mg total) under the tongue every 5 (five) minutes as needed for chest pain (x 3 doses). Reported on 05/08/2015   . ondansetron (ZOFRAN-ODT) 4 MG disintegrating tablet Take 1 tablet (4 mg total) by mouth every 8 (eight) hours as needed for nausea or vomiting.   Marland Kitchen PERCOCET 10-325 MG tablet Take 1 tablet by mouth 5 (five) times daily as needed.   . potassium chloride SA (KLOR-CON) 20 MEQ tablet Take 1 tablet (20 mEq total) by mouth 2 (two) times daily.   . Semaglutide, 1 MG/DOSE, (OZEMPIC, 1 MG/DOSE,) 4 MG/3ML SOPN Inject 1 mg into the skin once a week. 01/03/2020: Via Novo Cares PAP  . spironolactone (ALDACTONE) 25 MG tablet Take 1 tablet (25 mg total) by mouth 2 (two) times daily. (Patient taking differently: Take 25 mg by mouth daily. )   . telmisartan (MICARDIS) 40 MG tablet TAKE ONE TABLET BY MOUTH DAILY   . thiamine 100 MG tablet Take 1 tablet (100 mg total) by mouth daily.   Marland Kitchen triamcinolone cream (KENALOG) 0.5 % Apply 1 application topically 3 (three) times daily.   Marland Kitchen warfarin (COUMADIN) 3 MG tablet TAKE ONE TABLET BY MOUTH DAILY OR AS DIRECTED by anticoagulation clinic   . [DISCONTINUED] atorvastatin (LIPITOR) 40 MG tablet Take 1 tablet (40 mg total) by mouth daily.   . [DISCONTINUED] atorvastatin (LIPITOR) 40 MG tablet TAKE 1 TABLET(40 MG) BY MOUTH DAILY   . [DISCONTINUED] telmisartan (MICARDIS) 40 MG tablet Take 1 tablet (40 mg total) by mouth daily.    Facility-Administered Encounter Medications as of 01/06/2020  Medication  . cyanocobalamin ((VITAMIN B-12)) injection 1,000 mcg   Wt Readings from  Last 3 Encounters:  11/19/19 210 lb (95.3 kg)  10/16/19 211 lb (95.7 kg)  10/10/19 213 lb (96.6 kg)    Current Diagnosis/Assessment:   Goals Addressed   None     Diabetes   A1c goal < 7%  Recent Relevant Labs: Lab Results  Component Value Date/Time   HGBA1C 8.3 (H) 10/10/2019 03:46 PM   HGBA1C 9.0 (A) 04/11/2019 02:39 PM   HGBA1C 9.0 (A) 12/19/2018 04:44 PM   HGBA1C 8.4 (H) 08/07/2017 02:52 PM   MICROALBUR <0.7 04/11/2019 02:55 PM   MICROALBUR <0.7 12/28/2017 03:51 PM   Last diabetic Foot exam:  Lab Results  Component Value Date/Time   HMDIABEYEEXA No Retinopathy 03/21/2019 12:00 AM    Last diabetic Eye exam:  Lab Results  Component Value Date/Time   HMDIABFOOTEX normal 04/02/2014 12:00 AM    Checking BG: 2x per Day  Recent FBG Readings: 104-160 Recent 2hr PP BG readings: 100  Patient has failed these meds  in past: n/a Patient is currently uncontrolled on the following medications:   Jardiance 25 mg daily (BI Cares PAP)  Metformin ER 1000 mg BID,   Toujeo Max 50 units daily PM (Sanofi PAP)  Ozempic 1 mg weekly (Novo Cares PAP)  We discussed: Pt is awaiting Ozempic from [employer] (in process of switching from Victoza due to improved BG lowering and wt loss with high dose Ozempic).   Discussed PAP renewal process - Novo and BI Cares can apply immediately for 2022, Sanofi has an OOP minimum but her insurance plan includes $35/mo insulin so this should be affordable in early 2022.   Plan  Continue current medications and control with diet and exercise   Heart Failure / Hypertension   HF type: combined, LV dysfunction Last EF: 50-55% (09/02/2014)  BP goal < 130/80  BP Readings from Last 3 Encounters:  11/19/19 104/66  10/16/19 112/72  10/10/19 120/70   Lab Results  Component Value Date   CREATININE 0.90 10/16/2019   BUN 18 10/16/2019   GFR 78.64 04/11/2019   GFRNONAA 64 10/16/2019   GFRAA 75 10/16/2019   NA 139 10/16/2019   K 4.3  10/16/2019   CALCIUM 10.2 10/16/2019   CO2 21 10/16/2019   Patient checks BP at home: no  Patient has failed these meds in past: n/a Patient is currently controlled on the following medications:   metoprolol succinate 100 mg BID   telmisartan 40 mg daily  spironolactone 25 mg daily  furosemide 20 mg daily   potassium chloride 20 meq BID  Nitroglycerin 0.4 mg SL prn  We discussed diet and exercise extensively; pt denies issues with meds, denies shortness of breath or excessive leg swelling.  Plan  Continue current medications and control with diet and exercise  AFIB   Patient is currently rate controlled. Pulse Readings from Last 3 Encounters:  11/19/19 95  10/16/19 78  10/10/19 68   CHA2DS2-VASc Score = 8  The patient's score is based upon: CHF History: 1 HTN History: 1 Diabetes History: 1 Stroke History: 2 Vascular Disease History: 1   Lab Results  Component Value Date/Time   INR 2.7 12/10/2019 12:00 AM   INR 2.4 10/31/2019 12:00 AM   INR 3.1 (H) 03/16/2016 11:48 AM   INR 2.21 02/05/2016 03:27 PM   INR 1.8 05/11/2010 01:39 PM   INR 2.1 04/12/2010 01:22 PM  . Patient has failed these meds in past: n/a Patient is currently controlled on the following medications:   metoprolol succinate 100 mg BID,   warfarin 3 mg AD  We discussed:  Pt is following with coumadin clinic regularly, INR has generally been at goal and pt does not endorse bleeding issues. She does report stress/anxiety causes tachycardia and Afib so she always has Xanax on hand.  Plan  Continue current medications  Hyperlipidemia / CAD   CABG 2004. Stroke 2004. Cath 2014 showed stable CAD. LDL goal < 70  Lab Results  Component Value Date   CHOL 126 04/11/2019   HDL 36.70 (L) 04/11/2019   LDLCALC 65 04/11/2019   LDLDIRECT 116.0 11/28/2008   TRIG 123.0 04/11/2019   CHOLHDL 3 04/11/2019    Hepatic Function Latest Ref Rng & Units 04/11/2019 12/19/2018 12/28/2017  Total Protein 6.0 -  8.3 g/dL 8.0 7.8 7.8  Albumin 3.5 - 5.2 g/dL 4.4 4.9 4.4  AST 0 - 37 U/L $Remo'12 15 8  'OTHEz$ ALT 0 - 35 U/L $Remo'13 12 10  'gyNqw$ Alk Phosphatase 39 - 117  U/L 53 47 62  Total Bilirubin 0.2 - 1.2 mg/dL 0.4 1.5(H) 0.8  Bilirubin, Direct 0.0 - 0.3 mg/dL 0.1 0.3 -   The ASCVD Risk score Mikey Bussing DC Jr., et al., 2013) failed to calculate for the following reasons:   The valid total cholesterol range is 130 to 320 mg/dL  Patient has failed these meds in past: n/a Patient is currently controlled on the following medications:   atorvastatin 40 mg daily  We discussed:  diet and exercise extensively; Cholesterol goals; benefits of statin for ASCVD risk reduction  Plan  Continue current medications and control with diet and exercise   Anxiety/isomnia   Patient has failed these meds in past: n/a Patient is currently controlled on the following medications:   alprazolam 1 mg TID prn  We discussed:  Pt has been on alprazolam for ~20 years, she is worried about withdrawal if she were to stop. She also reports anxiety/stress triggers Afib. She reports anxiety is worse in winter months/around holidays  Plan  Continue current medications    Medication Management   Pt uses [facility] for all medications 90-day vials Pt endorses 100% compliance   We discussed: Reviewed patient's [facility] medication and Epic medication profile assuring there are no discrepancies or gaps in therapy. Confirmed all fill dates appropriate and verified with patient that there is a sufficient quantity of all prescribed medications at home. Informed patient to call me any time if needing medications before scheduled deliveries.    Plan  Utilize [facility] pharmacy for medication synchronization, packaging and delivery Coordinate acute delivery of Alprazolam 1 mg and Oxycodone-PAP 10-325 mg for 01/10/20    Follow up: 3 month phone visit  Charlene Brooke, PharmD, [facility] Clinical Pharmacist [facility] [phone]

## 2020-01-08 ENCOUNTER — Telehealth: Payer: Self-pay

## 2020-01-08 NOTE — Telephone Encounter (Signed)
Called pt, LVM. Medication assistance Toujeo and Ozempic ready for pick up.   Located in the lab fridge.

## 2020-01-09 ENCOUNTER — Other Ambulatory Visit: Payer: Self-pay | Admitting: *Deleted

## 2020-01-09 NOTE — Patient Outreach (Signed)
[facility]) Care Management  [place]  01/09/2020   Kaitlynne Wenz 1945-12-01 [id number]   RN Health Coach Monthly Outreach  Referral Date:01/02/2018 Referral Source:MD Referral Screening Reason for Referral:Disease Management Education Insurance:[employer] Medicare   Outreach Attempt:  Outreach attempt #1 to patient for follow up. No answer. RN Health Coach left voicemail message along with contact information.  Plan:   RN Health Coach will make another outreach attempt within the month of December if no return call back from patient.  [place] [phone] [email or web address] [email or web address]

## 2020-01-21 ENCOUNTER — Telehealth: Payer: Self-pay | Admitting: Pharmacist

## 2020-01-21 ENCOUNTER — Ambulatory Visit (INDEPENDENT_AMBULATORY_CARE_PROVIDER_SITE_OTHER): Payer: Medicare Other | Admitting: General Practice

## 2020-01-21 ENCOUNTER — Other Ambulatory Visit: Payer: Self-pay

## 2020-01-21 DIAGNOSIS — Z7901 Long term (current) use of anticoagulants: Secondary | ICD-10-CM | POA: Diagnosis not present

## 2020-01-21 DIAGNOSIS — E538 Deficiency of other specified B group vitamins: Secondary | ICD-10-CM | POA: Diagnosis not present

## 2020-01-21 DIAGNOSIS — I480 Paroxysmal atrial fibrillation: Secondary | ICD-10-CM

## 2020-01-21 LAB — POCT INR: INR: 2.5 (ref 2.0–3.0)

## 2020-01-21 MED ORDER — CYANOCOBALAMIN 1000 MCG/ML IJ SOLN
1000.0000 ug | Freq: Once | INTRAMUSCULAR | Status: AC
  Administered 2020-03-03: 15:00:00 1000 ug via INTRAMUSCULAR

## 2020-01-21 NOTE — Progress Notes (Signed)
Chronic Care Management Pharmacy Assistant   Name: Kristen [name]  MRN: [id number] DOB: 1945-05-04  Reason for Encounter: Medication Review  Patient Questions:  1.  Have you seen any other providers since your last visit? No  2.  Any changes in your medicines or health? No    PCP : [name], MD  Allergies:   Allergies  Allergen Reactions  . Metformin And Related Other (See Comments)    Must take XR form only, cannot tolerate Regular release metformin  . Cleocin [Clindamycin Hcl] Diarrhea  . Codeine Itching  . Doxycycline Hyclate Diarrhea  . Macrolides And Ketolides Diarrhea  . Morphine Hives  . Pentazocine Lactate Itching and Nausea And Vomiting    (GENERIC- Talwin)  . Vibramycin [Doxycycline Calcium] Diarrhea and Rash  . Clindamycin Diarrhea  . Definity [Perflutren Lipid Microsphere] Other (See Comments)    Patient complained of warm sensation in chest x few seconds duration when injected Definity.No other symptoms  . Doxycycline Diarrhea  . Metformin Other (See Comments)  . Pentazocine Nausea Only  . Perflutren Other (See Comments)  . Perflutren Lipid Microspheres Other (See Comments)  . Sulfa Antibiotics Diarrhea and Rash  . Sulfonamide Derivatives Rash    Medications: Outpatient Encounter Medications as of 01/21/2020  Medication Sig Note  . ALPRAZolam (XANAX) 1 MG tablet Take 1 tablet (1 mg total) by mouth 3 (three) times daily as needed for anxiety.   . Ascorbic Acid (VITAMIN C) 1000 MG tablet Take 1,000 mg by mouth daily.   Marland Kitchen atorvastatin (LIPITOR) 40 MG tablet TAKE ONE TABLET BY MOUTH DAILY   . Blood Glucose Monitoring Suppl (ONE TOUCH ULTRA 2) w/Device KIT Use as advised   . Cholecalciferol (VITAMIN D3) 50 MCG (2000 UT) capsule Take 2,000 Units by mouth daily.   . empagliflozin (JARDIANCE) 25 MG TABS tablet Take 25 mg by mouth daily.   . furosemide (LASIX) 20 MG tablet Take 1 tablet (20 mg total) by mouth daily.   Marland Kitchen gabapentin (NEURONTIN) 300  MG capsule TAKE ONE CAPSULE BY MOUTH THREE TIMES DAILY   . glucose blood (COOL BLOOD GLUCOSE TEST STRIPS) test strip Use to test blood sugar twice daily. DX: E11.9   . insulin glargine, 2 Unit Dial, (TOUJEO MAX SOLOSTAR) 300 UNIT/ML Solostar Pen Inject 50 Units into the skin daily.   . Insulin Pen Needle (NOVOFINE) 32G X 6 MM MISC 1 Act by Does not apply route daily.   . Lancets (ONETOUCH ULTRASOFT) lancets Use as instructed   . liraglutide (VICTOZA) 18 MG/3ML SOPN Inject 0.3 mLs (1.8 mg total) into the skin every morning.   . magnesium oxide (MAG-OX) 400 MG tablet TAKE ONE TABLET BY MOUTH BY MOUTH TWICE DAILY WITH BREAKFAST AND EVENING MEAL]   . metFORMIN (GLUCOPHAGE-XR) 500 MG 24 hr tablet TAKE TWO TABLETS (1,085m total) BY MOUTH TWICE DAILY WITH A MEAL   . metoprolol succinate (TOPROL-XL) 100 MG 24 hr tablet Take 1 tablet (100 mg total) by mouth 2 (two) times daily. Take with or immediately following a meal.   . Multiple Vitamin (MULTIVITAMIN) tablet Take 1 tablet by mouth daily.   . nitroGLYCERIN (NITROSTAT) 0.4 MG SL tablet Place 1 tablet (0.4 mg total) under the tongue every 5 (five) minutes as needed for chest pain (x 3 doses). Reported on 05/08/2015   . ondansetron (ZOFRAN-ODT) 4 MG disintegrating tablet Take 1 tablet (4 mg total) by mouth every 8 (eight) hours as needed for nausea or vomiting.   .Marland Kitchen  PERCOCET 10-325 MG tablet Take 1 tablet by mouth 5 (five) times daily as needed.   . potassium chloride SA (KLOR-CON) 20 MEQ tablet Take 1 tablet (20 mEq total) by mouth 2 (two) times daily.   . Semaglutide, 1 MG/DOSE, (OZEMPIC, 1 MG/DOSE,) 4 MG/3ML SOPN Inject 1 mg into the skin once a week. 01/03/2020: Via Novo Cares PAP  . spironolactone (ALDACTONE) 25 MG tablet Take 1 tablet (25 mg total) by mouth 2 (two) times daily. (Patient taking differently: Take 25 mg by mouth daily. )   . telmisartan (MICARDIS) 40 MG tablet TAKE ONE TABLET BY MOUTH DAILY   . thiamine 100 MG tablet Take 1 tablet (100 mg  total) by mouth daily.   Marland Kitchen triamcinolone cream (KENALOG) 0.5 % Apply 1 application topically 3 (three) times daily.   Marland Kitchen warfarin (COUMADIN) 3 MG tablet TAKE ONE TABLET BY MOUTH DAILY OR AS DIRECTED by anticoagulation clinic   . [DISCONTINUED] atorvastatin (LIPITOR) 40 MG tablet Take 1 tablet (40 mg total) by mouth daily.   . [DISCONTINUED] atorvastatin (LIPITOR) 40 MG tablet TAKE 1 TABLET(40 MG) BY MOUTH DAILY   . [DISCONTINUED] telmisartan (MICARDIS) 40 MG tablet Take 1 tablet (40 mg total) by mouth daily.    Facility-Administered Encounter Medications as of 01/21/2020  Medication  . cyanocobalamin ((VITAMIN B-12)) injection 1,000 mcg    Current Diagnosis: Patient Active Problem List   Diagnosis Date Noted  . Thiamine deficiency 12/30/2018  . Vitamin B12 deficiency anemia due to intrinsic factor deficiency 12/20/2018  . CHF (congestive heart failure), NYHA class I, chronic, combined ([place]) 12/19/2018  . Coronary artery disease involving native coronary artery of native heart with angina pectoris ([place]) 12/19/2018  . Obesity with body mass index (BMI) of 30.0 to 39.9 01/15/2018  . Chronic idiopathic constipation 12/27/2017  . Atrial fibrillation ([place]) 10/30/2017  . Type 2 diabetes mellitus ([place]) 10/30/2017  . Eczema 08/07/2017  . Insomnia secondary to chronic pain 08/04/2016  . Visit for screening mammogram 04/14/2016  . LV dysfunction   . Routine general medical examination at a health care facility 05/03/2013  . DM (diabetes mellitus), type 2 with peripheral vascular complications ([place]) 43/15/4008  . PVC's/nonsustained VT 10/24/2012  . H/O: CVA (cerebrovascular accident) 02/09/2012  . Hypothyroidism 05/27/2011  . Fibromyalgia 10/08/2010  . Long term current use of anticoagulant 05/05/2010  . Low back pain, non-specific 05/12/2008  . Cognitive dysfunction associated with depression 01/29/2007  . Osteoarthritis 11/22/2006  . Hyperlipidemia associated with type 2 diabetes mellitus  ([place]) 09/13/2006  . Coronary atherosclerosis 09/13/2006  . Paroxysmal atrial fibrillation ([place]) 09/13/2006  . GERD 09/13/2006  . Hypertension associated with diabetes ([place]) 08/30/2006    Goals Addressed   None     Follow-Up:  Coordination of Enhanced Pharmacy Services  Reviewed chart for medication changes ahead of medication coordination call.  No OVs, Consults, or hospital visits since last care coordination call/Pharmacist visit.  No medication changes indicated   BP Readings from Last 3 Encounters:  11/19/19 104/66  10/16/19 112/72  10/10/19 120/70    Lab Results  Component Value Date   HGBA1C 8.3 (H) 10/10/2019     Patient obtains medications through Vials  90 Days   Last adherence delivery included:   One touch test strips Percocet 10 mg-352 mg 1 tab 5 times daily as needed Spironolactone 25 mg 1 tab 2 twice daily Atorvastatin 40 mg 1 tab daily Telmisartan 40 mg 1 tab daily Metoprolol suc 100 mg 1 tab twice daily  breakfast and bedtime Potassium cl er 20 meq 1 tab twice daily breakfast and bedtime Warfarin 3 mg 1 tab daily as directed Cyclobenzaprine 10 mg 1 tab three times daily as needed  Patient declined Gabapentin and metformin last month due to PRN use/additional supply on hand. Explanation of abundance on hand (ie #30 due to overlapping fills or previous adherence issues etc)  Patient is due for next adherence delivery on: 01/24/20. Called patient and reviewed medications and coordinated delivery.  This delivery to include: Warfarin 3 mg and metformin 500 mg   Patient declined the following medications One touch test strips Percocet 10 mg-352 mg 1 tab 5 times daily as needed Spironolactone 25 mg 1 tab 2 twice daily Atorvastatin 40 mg 1 tab daily Telmisartan 40 mg 1 tab daily Metoprolol suc 100 mg 1 tab twice daily breakfast and bedtime Potassium cl er 20 meq 1 tab twice daily breakfast and bedtime Cyclobenzaprine 10 mg 1 tab three times daily as  needed   Due to patient has enough to last until next year  Patient needs refills for percocet 10 mg-325 mg prescribed by Dr. Hardin Negus.  Confirmed delivery date of 01/24/20, advised patient that pharmacy will contact them the morning of delivery.   Wendy Poet, Clinical Pharmacy Assistant [facility]

## 2020-01-21 NOTE — Patient Instructions (Addendum)
Pre visit review using our clinic review tool, if applicable. No additional management support is needed unless otherwise documented below in the visit note.  Continue to take 1 tablet daily except 2 tablets on Mon and Fridays. Re-check in 6 weeks.  

## 2020-01-21 NOTE — Progress Notes (Signed)
I have reviewed and agree.

## 2020-01-23 ENCOUNTER — Telehealth: Payer: Self-pay | Admitting: Pharmacist

## 2020-01-23 ENCOUNTER — Other Ambulatory Visit: Payer: Self-pay | Admitting: Internal Medicine

## 2020-01-23 DIAGNOSIS — Z7901 Long term (current) use of anticoagulants: Secondary | ICD-10-CM

## 2020-01-23 NOTE — Progress Notes (Signed)
Chronic Care Management Pharmacy Assistant   Name: Kristen [name]  MRN: [id number] DOB: [birth year]  Reason for Encounter: Medication Review  Patient Questions:  1.  Have you seen any other providers since your last visit? No  2.  Any changes in your medicines or health? No   PCP : [name], MD  Allergies:   Allergies  Allergen Reactions  . Metformin And Related Other (See Comments)    Must take XR form only, cannot tolerate Regular release metformin  . Cleocin [Clindamycin Hcl] Diarrhea  . Codeine Itching  . Doxycycline Hyclate Diarrhea  . Macrolides And Ketolides Diarrhea  . Morphine Hives  . Pentazocine Lactate Itching and Nausea And Vomiting    (GENERIC- [place])  . Vibramycin [Doxycycline Calcium] Diarrhea and Rash  . Clindamycin Diarrhea  . Definity [Perflutren Lipid Microsphere] Other (See Comments)    Patient complained of warm sensation in chest x few seconds duration when injected Definity.No other symptoms  . Doxycycline Diarrhea  . Metformin Other (See Comments)  . Pentazocine Nausea Only  . Perflutren Other (See Comments)  . Perflutren Lipid Microspheres Other (See Comments)  . Sulfa Antibiotics Diarrhea and Rash  . Sulfonamide Derivatives Rash    Medications: Outpatient Encounter Medications as of 01/23/2020  Medication Sig Note  . ALPRAZolam (XANAX) 1 MG tablet Take 1 tablet (1 mg total) by mouth 3 (three) times daily as needed for anxiety.   . Ascorbic Acid (VITAMIN C) 1000 MG tablet Take 1,000 mg by mouth daily.   Marland Kitchen atorvastatin (LIPITOR) 40 MG tablet TAKE ONE TABLET BY MOUTH DAILY   . Blood Glucose Monitoring Suppl (ONE TOUCH ULTRA 2) w/Device KIT Use as advised   . Cholecalciferol (VITAMIN D3) 50 MCG (2000 UT) capsule Take 2,000 Units by mouth daily.   . empagliflozin (JARDIANCE) 25 MG TABS tablet Take 25 mg by mouth daily.   . furosemide (LASIX) 20 MG tablet Take 1 tablet (20 mg total) by mouth daily.   Marland Kitchen gabapentin (NEURONTIN) 300  MG capsule TAKE ONE CAPSULE BY MOUTH THREE TIMES DAILY   . glucose blood (COOL BLOOD GLUCOSE TEST STRIPS) test strip Use to test blood sugar twice daily. DX: E11.9   . insulin glargine, 2 Unit Dial, (TOUJEO MAX SOLOSTAR) 300 UNIT/ML Solostar Pen Inject 50 Units into the skin daily.   . Insulin Pen Needle (NOVOFINE) 32G X 6 MM MISC 1 Act by Does not apply route daily.   . Lancets (ONETOUCH ULTRASOFT) lancets Use as instructed   . liraglutide (VICTOZA) 18 MG/3ML SOPN Inject 0.3 mLs (1.8 mg total) into the skin every morning.   . magnesium oxide (MAG-OX) 400 MG tablet TAKE ONE TABLET BY MOUTH BY MOUTH TWICE DAILY WITH BREAKFAST AND EVENING MEAL]   . metFORMIN (GLUCOPHAGE-XR) 500 MG 24 hr tablet TAKE TWO TABLETS (1,$RemoveBefore'000mg'GjEhbApQxuSZo$  total) BY MOUTH TWICE DAILY WITH A MEAL   . metoprolol succinate (TOPROL-XL) 100 MG 24 hr tablet Take 1 tablet (100 mg total) by mouth 2 (two) times daily. Take with or immediately following a meal.   . Multiple Vitamin (MULTIVITAMIN) tablet Take 1 tablet by mouth daily.   . nitroGLYCERIN (NITROSTAT) 0.4 MG SL tablet Place 1 tablet (0.4 mg total) under the tongue every 5 (five) minutes as needed for chest pain (x 3 doses). Reported on 05/08/2015   . ondansetron (ZOFRAN-ODT) 4 MG disintegrating tablet Take 1 tablet (4 mg total) by mouth every 8 (eight) hours as needed for nausea or vomiting.   Marland Kitchen  PERCOCET 10-325 MG tablet Take 1 tablet by mouth 5 (five) times daily as needed.   . potassium chloride SA (KLOR-CON) 20 MEQ tablet Take 1 tablet (20 mEq total) by mouth 2 (two) times daily.   . Semaglutide, 1 MG/DOSE, (OZEMPIC, 1 MG/DOSE,) 4 MG/3ML SOPN Inject 1 mg into the skin once a week. 01/03/2020: Via [facility] [facility] PAP  . spironolactone (ALDACTONE) 25 MG tablet Take 1 tablet (25 mg total) by mouth 2 (two) times daily. (Patient taking differently: Take 25 mg by mouth daily. )   . telmisartan (MICARDIS) 40 MG tablet TAKE ONE TABLET BY MOUTH DAILY   . thiamine 100 MG tablet Take 1 tablet (100 mg  total) by mouth daily.   Marland Kitchen triamcinolone cream (KENALOG) 0.5 % Apply 1 application topically 3 (three) times daily.   Marland Kitchen warfarin (COUMADIN) 3 MG tablet TAKE ONE TABLET BY MOUTH ONCE DAILY OR USE AS DIRECTED by clinic   . [DISCONTINUED] atorvastatin (LIPITOR) 40 MG tablet Take 1 tablet (40 mg total) by mouth daily.   . [DISCONTINUED] atorvastatin (LIPITOR) 40 MG tablet TAKE 1 TABLET(40 MG) BY MOUTH DAILY   . [DISCONTINUED] telmisartan (MICARDIS) 40 MG tablet Take 1 tablet (40 mg total) by mouth daily.    Facility-Administered Encounter Medications as of 01/23/2020  Medication  . cyanocobalamin ((VITAMIN B-12)) injection 1,000 mcg    Current Diagnosis: Patient Active Problem List   Diagnosis Date Noted  . Thiamine deficiency 12/30/2018  . Vitamin B12 deficiency anemia due to intrinsic factor deficiency 12/20/2018  . CHF (congestive heart failure), NYHA class I, chronic, combined ([place]) 12/19/2018  . Coronary artery disease involving native coronary artery of native heart with angina pectoris ([place]) 12/19/2018  . Obesity with body mass index (BMI) of 30.0 to 39.9 01/15/2018  . Chronic idiopathic constipation 12/27/2017  . Atrial fibrillation ([place]) 10/30/2017  . Type 2 diabetes mellitus ([place]) 10/30/2017  . Eczema 08/07/2017  . Insomnia secondary to chronic pain 08/04/2016  . Visit for screening mammogram 04/14/2016  . LV dysfunction   . Routine general medical examination at a health care facility 05/03/2013  . DM (diabetes mellitus), type 2 with peripheral vascular complications ([place]) 97/67/3419  . PVC's/nonsustained VT 10/24/2012  . H/O: CVA (cerebrovascular accident) 02/09/2012  . Hypothyroidism 05/27/2011  . Fibromyalgia 10/08/2010  . Long term current use of anticoagulant 05/05/2010  . Low back pain, non-specific 05/12/2008  . Cognitive dysfunction associated with depression 01/29/2007  . Osteoarthritis 11/22/2006  . Hyperlipidemia associated with type 2 diabetes mellitus ([place])  09/13/2006  . Coronary atherosclerosis 09/13/2006  . Paroxysmal atrial fibrillation ([place]) 09/13/2006  . GERD 09/13/2006  . Hypertension associated with diabetes ([place]) 08/30/2006    Goals Addressed   None     Follow-Up:  Coordination of Enhanced Pharmacy Services     Received call from patient regarding medication management via [facility].  Patient requested an acute fill for Alprazolam $RemoveBefore'1mg'mkCNyHlLSFcDV$   to be delivered: 02/07/2020 Pharmacy needs refills? No  Confirmed delivery date of 02/07/2020, advised patient that pharmacy will contact them the morning of delivery.  Rosendo Gros, [facility]  Practice Team Manager/ CPA (Clinical Pharmacist Assistant) [phone]

## 2020-01-23 NOTE — Progress Notes (Signed)
Patient also asking when to start [place]. She had her last dose of Victoza on 11/16. Ozempic can be started 24 hours after the last dose of Victoza so she can start today. Patient would like to make her weekly dose on Mondays. Advised her to take a dose today, skip the coming Monday, then resume weekly dosing on Mondays thereafter. Pt voiced understanding.

## 2020-01-27 DIAGNOSIS — M65312 Trigger thumb, left thumb: Secondary | ICD-10-CM | POA: Diagnosis not present

## 2020-01-27 DIAGNOSIS — Z79891 Long term (current) use of opiate analgesic: Secondary | ICD-10-CM | POA: Diagnosis not present

## 2020-01-27 DIAGNOSIS — M150 Primary generalized (osteo)arthritis: Secondary | ICD-10-CM | POA: Diagnosis not present

## 2020-01-27 DIAGNOSIS — G894 Chronic pain syndrome: Secondary | ICD-10-CM | POA: Diagnosis not present

## 2020-01-28 ENCOUNTER — Telehealth: Payer: Self-pay | Admitting: Pharmacist

## 2020-01-28 NOTE — Telephone Encounter (Signed)
Received call from patient to discuss possible Ozempic side effect.  Patient reports she took first dose of Ozempic 1 mg yesterday morning. She then had a doctor appt where she had to urinate and almost had an accident. Discussed urination issues are not documented side effects of Ozempic, but may be a sign of hyperglycemia. Pt reports the night prior to the appt she was very thirsty and drank 4 bottles of water overnight. Discussed polydipsia and polyuria are common symptoms of hyperglycemia. Pt did not check her blood sugar throughout this time period, but this morning it was 140 and she has felt normal today.  Urination issues seem to have resolved, advised patient to continue to monitor for symptoms and continue checking blood sugar twice a day. Patient voiced understanding of above.

## 2020-02-19 ENCOUNTER — Telehealth: Payer: Self-pay | Admitting: Pharmacist

## 2020-02-19 NOTE — Telephone Encounter (Signed)
It is time to renew patient assistance for Ozempic for 2022.  Reviewed application process for [employer] patient assistance program. Patient meets income/out of pocket spend criteria for the program. Patient will provide proof of income, out of pocket spend report, and will sign application. Will collaborate with prescriber Dr Ronnald Ramp for the provider portion of application. Once completed, application will be submitted via Fax   Patient will come to office to sign application.  Charlton Haws, [facility]

## 2020-02-19 NOTE — Progress Notes (Addendum)
Chronic Care Management Pharmacy Assistant   Name: Taheerah Guldin  MRN: [id number] DOB: 02-02-1946  Reason for Encounter: Medication Review  Patient Questions:  1.  Have you seen any other providers since your last visit? No  2.  Any changes in your medicines or health? No    PCP : [name], MD  Allergies:   Allergies  Allergen Reactions   Metformin And Related Other (See Comments)    Must take XR form only, cannot tolerate Regular release metformin   Cleocin [Clindamycin Hcl] Diarrhea   Codeine Itching   Doxycycline Hyclate Diarrhea   Macrolides And Ketolides Diarrhea   Morphine Hives   Pentazocine Lactate Itching and Nausea And Vomiting    (GENERIC- [place])   Vibramycin [Doxycycline Calcium] Diarrhea and Rash   Clindamycin Diarrhea   Definity [Perflutren Lipid Microsphere] Other (See Comments)    Patient complained of warm sensation in chest x few seconds duration when injected Definity.No other symptoms   Doxycycline Diarrhea   Metformin Other (See Comments)   Pentazocine Nausea Only   Perflutren Other (See Comments)   Perflutren Lipid Microspheres Other (See Comments)   Sulfa Antibiotics Diarrhea and Rash   Sulfonamide Derivatives Rash    Medications: Outpatient Encounter Medications as of 02/19/2020  Medication Sig Note   ALPRAZolam (XANAX) 1 MG tablet Take 1 tablet (1 mg total) by mouth 3 (three) times daily as needed for anxiety.    Ascorbic Acid (VITAMIN C) 1000 MG tablet Take 1,000 mg by mouth daily.    atorvastatin (LIPITOR) 40 MG tablet TAKE ONE TABLET BY MOUTH DAILY    Blood Glucose Monitoring Suppl (ONE TOUCH ULTRA 2) w/Device KIT Use as advised    Cholecalciferol (VITAMIN D3) 50 MCG (2000 UT) capsule Take 2,000 Units by mouth daily.    empagliflozin (JARDIANCE) 25 MG TABS tablet Take 25 mg by mouth daily.    furosemide (LASIX) 20 MG tablet Take 1 tablet (20 mg total) by mouth daily.    gabapentin (NEURONTIN) 300 MG capsule TAKE ONE  CAPSULE BY MOUTH THREE TIMES DAILY    glucose blood (COOL BLOOD GLUCOSE TEST STRIPS) test strip Use to test blood sugar twice daily. DX: E11.9    insulin glargine, 2 Unit Dial, (TOUJEO MAX SOLOSTAR) 300 UNIT/ML Solostar Pen Inject 50 Units into the skin daily.    Insulin Pen Needle (NOVOFINE) 32G X 6 MM MISC 1 Act by Does not apply route daily.    Lancets (ONETOUCH ULTRASOFT) lancets Use as instructed    magnesium oxide (MAG-OX) 400 MG tablet TAKE ONE TABLET BY MOUTH BY MOUTH TWICE DAILY WITH BREAKFAST AND EVENING MEAL]    metFORMIN (GLUCOPHAGE-XR) 500 MG 24 hr tablet TAKE TWO TABLETS (1,$RemoveBefore'000mg'cZYbVmGaISulh$  total) BY MOUTH TWICE DAILY WITH A MEAL    metoprolol succinate (TOPROL-XL) 100 MG 24 hr tablet Take 1 tablet (100 mg total) by mouth 2 (two) times daily. Take with or immediately following a meal.    Multiple Vitamin (MULTIVITAMIN) tablet Take 1 tablet by mouth daily.    nitroGLYCERIN (NITROSTAT) 0.4 MG SL tablet Place 1 tablet (0.4 mg total) under the tongue every 5 (five) minutes as needed for chest pain (x 3 doses). Reported on 05/08/2015    ondansetron (ZOFRAN-ODT) 4 MG disintegrating tablet Take 1 tablet (4 mg total) by mouth every 8 (eight) hours as needed for nausea or vomiting.    PERCOCET 10-325 MG tablet Take 1 tablet by mouth 5 (five) times daily as needed.  potassium chloride SA (KLOR-CON) 20 MEQ tablet Take 1 tablet (20 mEq total) by mouth 2 (two) times daily.    Semaglutide, 1 MG/DOSE, (OZEMPIC, 1 MG/DOSE,) 4 MG/3ML SOPN Inject 1 mg into the skin once a week. 01/03/2020: Via [facility] PAP   spironolactone (ALDACTONE) 25 MG tablet Take 1 tablet (25 mg total) by mouth 2 (two) times daily. (Patient taking differently: Take 25 mg by mouth daily. )    telmisartan (MICARDIS) 40 MG tablet TAKE ONE TABLET BY MOUTH DAILY    thiamine 100 MG tablet Take 1 tablet (100 mg total) by mouth daily.    triamcinolone cream (KENALOG) 0.5 % Apply 1 application topically 3 (three) times daily.    warfarin  (COUMADIN) 3 MG tablet TAKE ONE TABLET BY MOUTH ONCE DAILY OR USE AS DIRECTED by clinic    [DISCONTINUED] atorvastatin (LIPITOR) 40 MG tablet Take 1 tablet (40 mg total) by mouth daily.    [DISCONTINUED] atorvastatin (LIPITOR) 40 MG tablet TAKE 1 TABLET(40 MG) BY MOUTH DAILY    [DISCONTINUED] telmisartan (MICARDIS) 40 MG tablet Take 1 tablet (40 mg total) by mouth daily.    Facility-Administered Encounter Medications as of 02/19/2020  Medication   cyanocobalamin ((VITAMIN B-12)) injection 1,000 mcg    Current Diagnosis: Patient Active Problem List   Diagnosis Date Noted   Thiamine deficiency 12/30/2018   Vitamin B12 deficiency anemia due to intrinsic factor deficiency 12/20/2018   CHF (congestive heart failure), NYHA class I, chronic, combined ([place]) 12/19/2018   Coronary artery disease involving native coronary artery of native heart with angina pectoris ([place]) 12/19/2018   Obesity with body mass index (BMI) of 30.0 to 39.9 01/15/2018   Chronic idiopathic constipation 12/27/2017   Atrial fibrillation ([place]) 10/30/2017   Type 2 diabetes mellitus ([place]) 10/30/2017   Eczema 08/07/2017   Insomnia secondary to chronic pain 08/04/2016   Visit for screening mammogram 04/14/2016   LV dysfunction    Routine general medical examination at a health care facility 05/03/2013   DM (diabetes mellitus), type 2 with peripheral vascular complications ([place]) 73/53/2992   PVC's/nonsustained VT 10/24/2012   H/O: CVA (cerebrovascular accident) 02/09/2012   Hypothyroidism 05/27/2011   Fibromyalgia 10/08/2010   Long term current use of anticoagulant 05/05/2010   Low back pain, non-specific 05/12/2008   Cognitive dysfunction associated with depression 01/29/2007   Osteoarthritis 11/22/2006   Hyperlipidemia associated with type 2 diabetes mellitus ([place]) 09/13/2006   Coronary atherosclerosis 09/13/2006   Paroxysmal atrial fibrillation ([place]) 09/13/2006   GERD 09/13/2006   Hypertension associated with diabetes  ([place]) 08/30/2006    Goals Addressed   None     Follow-Up:  Coordination of Enhanced Pharmacy Services   Reviewed chart for medication changes ahead of medication coordination call.  No OVs, Consults, or hospital visits since last care coordination call/Pharmacist visit. (If appropriate, list visit date, provider name)  No medication changes indicated OR if recent visit, treatment plan here.  BP Readings from Last 3 Encounters:  11/19/19 104/66  10/16/19 112/72  10/10/19 120/70    Lab Results  Component Value Date   HGBA1C 8.3 (H) 10/10/2019     Patient obtains medications through Vials  90 Days   Last adherence delivery included:  Warfarin 3 mg 1 tab daily as directed Metformin 500 mg take two tab twice daily with food  Patient is due for next adherence delivery on: 03/06/20. Called patient and reviewed medications and coordinated delivery.  This delivery to include:  Alprazolam 1 mg take one  tab by mouth three times daily as needed for anxiety   Confirmed delivery date of 03/06/20, advised patient that pharmacy will contact them the morning of delivery.  Wendy Poet, Clinical Pharmacist Assistant [facility]

## 2020-02-20 ENCOUNTER — Other Ambulatory Visit: Payer: Self-pay | Admitting: *Deleted

## 2020-02-20 NOTE — Patient Outreach (Signed)
[facility]) Care Management  [place]  02/20/2020   Kristen [name] 1945/05/22 [id number]   RN Health Coach Monthly Outreach  Referral Date:01/02/2018 Referral Source:MD Referral Screening Reason for Referral:Disease Management Education Insurance:[employer]   Outreach Attempt:  Outreach attempt #2 to patient for follow up.  Patient answered and verified HIPAA.  States she is not feeling well today, has diarrhea that started this morning.  Requesting call back on another day when she is feeling better.  Discussed with patient importance of staying hydrated and encouraged her to contact provider if diarrhea is not resolved by tomorrow.  Also reviewed with patient I will be changing positions and she will be contacted by another Health Coach in the future; patient continues to consent to future outreaches.  Plan:  RN Health Coach will make another telephone outreach to patient within the month of December per patient's request.   Kristen Azure RN [place] [phone] [name]

## 2020-02-24 DIAGNOSIS — M65312 Trigger thumb, left thumb: Secondary | ICD-10-CM | POA: Diagnosis not present

## 2020-02-24 DIAGNOSIS — M150 Primary generalized (osteo)arthritis: Secondary | ICD-10-CM | POA: Diagnosis not present

## 2020-02-24 DIAGNOSIS — G894 Chronic pain syndrome: Secondary | ICD-10-CM | POA: Diagnosis not present

## 2020-02-24 DIAGNOSIS — Z79891 Long term (current) use of opiate analgesic: Secondary | ICD-10-CM | POA: Diagnosis not present

## 2020-02-26 ENCOUNTER — Other Ambulatory Visit: Payer: Self-pay | Admitting: *Deleted

## 2020-02-26 ENCOUNTER — Encounter: Payer: Self-pay | Admitting: *Deleted
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
## 2020-02-26 NOTE — Patient Instructions (Signed)
Please discuss with your Primary Care Provider, Dr. Ronnald Ramp at your next appointment about needing your teeth pulled and possible the need for it to be done in the hospital related to your coumadin.  Ask if it is possible to consult the hospital dentist to have teeth extracted.  I am also sending you a list of affordable dental options.  Please review and make some phone calls.   Goals Addressed              This Visit's Progress     [facility]) Find Help in My Community        Timeframe:  Short-Term Goal Priority:  High Start Date:   02/26/20                          Expected End Date:   05/04/20                    Follow Up Date 05/04/20    - call 211 when I need some help - follow-up on any referrals for help I am given - think ahead to make sure my need does not become an emergency - have a back-up plan  -Review list of Affordable Dental Resources mailed out -Discuss Dental options with primary care provider -Sign medication assistance form and provide financial information needed for Embedded Pharmacist to complete application process   Why is this important?    Knowing how and where to find help for yourself or family in your neighborhood and community is an important skill.   You will want to take some steps to learn how.    Notes:       [facility]) Learn More About My Health        Timeframe:  Short-Term Goal Priority:  Medium Start Date: 02/26/2020                            Expected End Date:   05/04/20                     Follow Up Date 05/04/20    - tell my story and reason for my visit - make a list of questions - ask questions - repeat what I heard to make sure I understand - bring a list of my medicines to the visit - speak up when I don't understand    Why is this important?    The best way to learn about your health and care is by talking to the doctor and nurse.   They will answer your questions and give you information in the way that you like best.     Notes:       [facility]) Make and Keep All Appointments        Timeframe:  Long-Range Goal Priority:  High Start Date:   02/26/20                          Expected End Date:  09/03/20                     Follow Up Date 05/04/20   - ask family or friend for a ride - call to cancel if needed - keep a calendar with appointment dates  -discuss with PCP dental caries and need for teeth removal and possible request dental referral of [facility] hospital  dentist for teeth removal   Why is this important?    Part of staying healthy is seeing the doctor for follow-up care.   If you forget your appointments, there are some things you can do to stay on track.    Notes:       ([facility]) Monitor and Manage My Blood Sugar-Diabetes Type 2        Timeframe:  Long-Range Goal Priority:  Medium Start Date:  02/26/2020                           Expected End Date:   09/03/20                    Follow Up Date 05/04/20    - check blood sugar at prescribed times - check blood sugar if I feel it is too high or too low - enter blood sugar readings and medication or insulin into daily log - take the blood sugar log to all doctor visits - take the blood sugar meter to all doctor visits    Why is this important?    Checking your blood sugar at home helps to keep it from getting very high or very low.   Writing the results in a diary or log helps the doctor know how to care for you.   Your blood sugar log should have the time, date and the results.   Also, write down the amount of insulin or other medicine that you take.   Other information, like what you ate, exercise done and how you were feeling, will also be helpful.     Notes:       COMPLETED: [facility]) Patient will report decreasing Hgb A1C by 0.2 points within the next 90 days. (pt-stated)        CARE PLAN ENTRY (see longtitudinal plan of care for additional care plan information)  Objective:  Lab Results  Component Value Date   HGBA1C 8.3 (H)  10/10/2019    Lab Results  Component Value Date   CREATININE 0.90 10/16/2019   CREATININE 0.86 10/10/2019   CREATININE 0.86 04/11/2019     No results found for: EGFR  Current Barriers:   Knowledge Deficits related to basic Diabetes pathophysiology and self care/management  Knowledge Deficits related to medications used for management of diabetes  Knowledge Deficits related to self administration of insulin  Knowledge Deficits related to self administration of injectable diabetes medications (Victoza)  Case Manager Clinical Goal(s):  Over the next 90 days, patient will demonstrate improved adherence to prescribed treatment plan for diabetes self care/management as evidenced by: decreasing Hgb A1C by 0.2 points (current 8.3)  Verbalize daily monitoring and recording of CBG within 90 days  Verbalize adherence to ADA/ carb modified diet within the next 90 days  Verbalize adherence to prescribed medication regimen within the next 90 days   Interventions:   Provided education to patient about basic DM disease process  Reviewed medications with patient and discussed importance of medication adherence  Discussed plans with patient for ongoing care management follow up and provided patient with direct contact information for care management team  Provided patient with written educational materials related to hypo and hyperglycemia and importance of correct treatment  Reviewed scheduled/upcoming provider appointments including: follow up with primary care provider and follow up with CCM Pharmacist  Advised patient, providing education and rationale, to check cbg at least 3 times a day or as provider  prescribed and record, calling primary care or pharmacist for findings outside established parameters.    Referral to pharmacy ; encouraged to contact CCM Pharmacist to discuss medications and Freestyle Libre continuous monitoring system; RN Health Coach will send CCM Pharmacist  Ainsley Spinner message to contact patient; 9/23-patient stating she is unable to afford freestyle libre and has went back to regular meter and monitoring blood sugars  Encouraged to make dental appointment and discuss teeth extractions with dentist and primary care provider  9/23-encouraged patient to discuss resuming glipizide with CCM Pharmacist  Patient Self Care Activities:   Self administers oral medications as prescribed  Self administers insulin as prescribed  Self administers injectable DM medication (Victoza) as prescribed  Attends all scheduled provider appointments  Checks blood sugars as prescribed and utilize hyper and hypoglycemia protocol as needed  Adheres to prescribed ADA/carb modified  Continue to weigh daily and notify provider based on increased weight  Make dental appointment  Maintain sleep/nightly routine  Discuss medications with CCM Pharmacist and possible resumption of glipizide and other options for sleep assistance  Goals continued    Resolving due to duplicate goals      [facility]) Set My Target A1C-Diabetes Type 2        Timeframe:  Long-Range Goal Priority:  Medium Start Date:  02/26/20                           Expected End Date: 09/03/20                      Follow Up Date 05/04/20    - set target A1C ; goal 6, current is 8.3   Why is this important?    Your target A1C is decided together by you and your doctor.   It is based on several things like your age and other health issues.    Notes:

## 2020-03-03 ENCOUNTER — Other Ambulatory Visit: Payer: Self-pay

## 2020-03-03 ENCOUNTER — Ambulatory Visit (INDEPENDENT_AMBULATORY_CARE_PROVIDER_SITE_OTHER): Payer: Medicare Other | Admitting: General Practice

## 2020-03-03 ENCOUNTER — Other Ambulatory Visit: Payer: Self-pay | Admitting: General Practice

## 2020-03-03 ENCOUNTER — Other Ambulatory Visit: Payer: Self-pay | Admitting: *Deleted

## 2020-03-03 DIAGNOSIS — I480 Paroxysmal atrial fibrillation: Secondary | ICD-10-CM | POA: Diagnosis not present

## 2020-03-03 DIAGNOSIS — Z7901 Long term (current) use of anticoagulants: Secondary | ICD-10-CM

## 2020-03-03 LAB — POCT INR: INR: 2 (ref 2.0–3.0)

## 2020-03-03 NOTE — Progress Notes (Signed)
I have reviewed and agree.

## 2020-03-03 NOTE — Patient Outreach (Signed)
[facility]) Care Management  03/03/2020  Sudie Bandel 1945-03-21 [id number]   CSW attempted initial contact with pt and was unable to reach.  CSW was able to leave a HIPPA compliant voice message and will attempt again in 3-4 business days if no return call is received.   Reece Levy, MSW, LCSW Clinical Social Worker  [facility] [employer] [phone]

## 2020-03-03 NOTE — Patient Instructions (Addendum)
Pre visit review using our clinic review tool, if applicable. No additional management support is needed unless otherwise documented below in the visit note.  Take 2 tablets today and then continue to take 1 tablet daily except 2 tablets on Mon and Fridays. Re-check in 6 weeks.

## 2020-03-04 ENCOUNTER — Other Ambulatory Visit: Payer: Self-pay | Admitting: *Deleted
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
## 2020-03-09 ENCOUNTER — Other Ambulatory Visit: Payer: Self-pay | Admitting: *Deleted

## 2020-03-09 ENCOUNTER — Ambulatory Visit: Payer: Medicare Other | Admitting: Physician Assistant

## 2020-03-09 ENCOUNTER — Telehealth: Payer: Self-pay

## 2020-03-09 DIAGNOSIS — I2510 Atherosclerotic heart disease of native coronary artery without angina pectoris: Secondary | ICD-10-CM

## 2020-03-09 MED ORDER — NITROGLYCERIN 0.4 MG SL SUBL: 0.4000 mg | SUBLINGUAL_TABLET | SUBLINGUAL | 2 refills | Status: DC | PRN

## 2020-03-09 NOTE — Telephone Encounter (Signed)
Janetta Hora, PA-C to Me     8:00 PM Can you please make sure she has some nitroglycerin and if she doesn't we can call some in. Let's also try to get her in with Dr. Randolm Idol Apps with gen cards to see If they can assess her and perhaps set up a stress test.   Thank you!  KT    Called the patient and offered an appointment with SW, PA this afternoon. Patient unable to come in today and prefers to wait for her appointment with Dr. Excell Seltzer in early March.  New Nitroglycerin Rx sent to [facility].

## 2020-03-10 ENCOUNTER — Ambulatory Visit: Payer: Medicare Other | Admitting: Internal Medicine

## 2020-03-10 NOTE — Patient Outreach (Signed)
[facility]) Care Management  03/10/2020  Kristen [name] Aug 17, 1945 [id number]   CSW spoke with pt on 03/09/2020 and confirmed identity. Per pt, things have been ok at home; she is still interested in seeking counseling support and would like to see a therapist at her Psychiatrist, Dr Caprice Renshaw office.   CSW offered to help and pt agreed.  Pt has appointment set up with "[name]" on 03/24/2020 at 1pm.  Pt appreciative.  CSW reminded pt to call if needs arise before follow up call on 1/17 as well as the crisis line info.    Reece Levy, MSW, LCSW Clinical Social Worker  [facility] [phone]

## 2020-03-11 ENCOUNTER — Other Ambulatory Visit: Payer: Self-pay

## 2020-03-11 DIAGNOSIS — I2510 Atherosclerotic heart disease of native coronary artery without angina pectoris: Secondary | ICD-10-CM

## 2020-03-11 MED ORDER — NITROGLYCERIN 0.4 MG SL SUBL: 0.4000 mg | SUBLINGUAL_TABLET | SUBLINGUAL | 2 refills | Status: DC | PRN

## 2020-03-16 ENCOUNTER — Other Ambulatory Visit: Payer: Self-pay | Admitting: Internal Medicine

## 2020-03-16 ENCOUNTER — Encounter: Payer: Self-pay | Admitting: Internal Medicine

## 2020-03-16 ENCOUNTER — Ambulatory Visit: Payer: Medicare Other | Admitting: Internal Medicine

## 2020-03-16 ENCOUNTER — Telehealth: Payer: Self-pay | Admitting: Pharmacist

## 2020-03-16 DIAGNOSIS — E1151 Type 2 diabetes mellitus with diabetic peripheral angiopathy without gangrene: Secondary | ICD-10-CM

## 2020-03-16 DIAGNOSIS — E1159 Type 2 diabetes mellitus with other circulatory complications: Secondary | ICD-10-CM

## 2020-03-16 DIAGNOSIS — I152 Hypertension secondary to endocrine disorders: Secondary | ICD-10-CM

## 2020-03-16 NOTE — Progress Notes (Signed)
Chronic Care Management Pharmacy Assistant   Name: Kristen [name]  MRN: [id number] DOB: [birth year]  Reason for Encounter: Medication Review  Patient Questions:  1.  Have you seen any other providers since your last visit? No  2.  Any changes in your medicines or health? No    PCP : [name], MD  Allergies:   Allergies  Allergen Reactions   Metformin And Related Other (See Comments)    Must take XR form only, cannot tolerate Regular release metformin   Cleocin [Clindamycin Hcl] Diarrhea   Codeine Itching   Doxycycline Hyclate Diarrhea   Macrolides And Ketolides Diarrhea   Morphine Hives   Pentazocine Lactate Itching and Nausea And Vomiting    (GENERIC- Talwin)   Vibramycin [Doxycycline Calcium] Diarrhea and Rash   Clindamycin Diarrhea   Definity [Perflutren Lipid Microsphere] Other (See Comments)    Patient complained of warm sensation in chest x few seconds duration when injected Definity.No other symptoms   Doxycycline Diarrhea   Metformin Other (See Comments)   Pentazocine Nausea Only   Perflutren Other (See Comments)   Perflutren Lipid Microspheres Other (See Comments)   Sulfa Antibiotics Diarrhea and Rash   Sulfonamide Derivatives Rash    Medications: Outpatient Encounter Medications as of 03/16/2020  Medication Sig Note   ALPRAZolam (XANAX) 1 MG tablet Take 1 tablet (1 mg total) by mouth 3 (three) times daily as needed for anxiety.    Ascorbic Acid (VITAMIN C) 1000 MG tablet Take 1,000 mg by mouth daily.    atorvastatin (LIPITOR) 40 MG tablet TAKE ONE TABLET BY MOUTH DAILY    Blood Glucose Monitoring Suppl (ONE TOUCH ULTRA 2) w/Device KIT Use as advised    Cholecalciferol (VITAMIN D3) 50 MCG (2000 UT) capsule Take 2,000 Units by mouth daily.    cyclobenzaprine (FLEXERIL) 10 MG tablet Take 10 mg by mouth 3 (three) times daily as needed for muscle spasms.    empagliflozin (JARDIANCE) 25 MG TABS tablet Take 25 mg by mouth  daily.    furosemide (LASIX) 20 MG tablet Take 1 tablet (20 mg total) by mouth daily.    gabapentin (NEURONTIN) 300 MG capsule TAKE ONE CAPSULE BY MOUTH THREE TIMES DAILY    glucose blood (COOL BLOOD GLUCOSE TEST STRIPS) test strip Use to test blood sugar twice daily. DX: E11.9    insulin glargine, 2 Unit Dial, (TOUJEO MAX SOLOSTAR) 300 UNIT/ML Solostar Pen Inject 50 Units into the skin daily.    Insulin Pen Needle (NOVOFINE) 32G X 6 MM MISC 1 Act by Does not apply route daily.    Lancets (ONETOUCH ULTRASOFT) lancets Use as instructed    magnesium oxide (MAG-OX) 400 MG tablet TAKE ONE TABLET BY MOUTH BY MOUTH TWICE DAILY WITH BREAKFAST AND EVENING MEAL]    metFORMIN (GLUCOPHAGE-XR) 500 MG 24 hr tablet TAKE TWO TABLETS (1,068m total) BY MOUTH TWICE DAILY WITH A MEAL    metoprolol succinate (TOPROL-XL) 100 MG 24 hr tablet Take 1 tablet (100 mg total) by mouth 2 (two) times daily. Take with or immediately following a meal.    Multiple Vitamin (MULTIVITAMIN) tablet Take 1 tablet by mouth daily.    nitroGLYCERIN (NITROSTAT) 0.4 MG SL tablet Place 1 tablet (0.4 mg total) under the tongue every 5 (five) minutes as needed for chest pain (x 3 doses). Reported on 05/08/2015    ondansetron (ZOFRAN-ODT) 4 MG disintegrating tablet Take 1 tablet (4 mg total) by mouth every 8 (eight) hours as needed for nausea  or vomiting.    PERCOCET 10-325 MG tablet Take 1 tablet by mouth 5 (five) times daily as needed.    potassium chloride SA (KLOR-CON) 20 MEQ tablet Take 1 tablet (20 mEq total) by mouth 2 (two) times daily.    Semaglutide, 1 MG/DOSE, (OZEMPIC, 1 MG/DOSE,) 4 MG/3ML SOPN Inject 1 mg into the skin once a week. 01/03/2020: [facility] PAP   spironolactone (ALDACTONE) 25 MG tablet Take 1 tablet (25 mg total) by mouth 2 (two) times daily. (Patient taking differently: Take 25 mg by mouth daily.)    telmisartan (MICARDIS) 40 MG tablet TAKE ONE TABLET BY MOUTH DAILY    thiamine 100 MG tablet Take  1 tablet (100 mg total) by mouth daily.    triamcinolone cream (KENALOG) 0.5 % Apply 1 application topically 3 (three) times daily.    warfarin (COUMADIN) 3 MG tablet TAKE ONE TABLET BY MOUTH ONCE DAILY OR USE AS DIRECTED by clinic    No facility-administered encounter medications on file as of 03/16/2020.    Current Diagnosis: Patient Active Problem List   Diagnosis Date Noted   Thiamine deficiency 12/30/2018   Vitamin B12 deficiency anemia due to intrinsic factor deficiency 12/20/2018   CHF (congestive heart failure), NYHA class I, chronic, combined ([place]) 12/19/2018   Coronary artery disease involving native coronary artery of native heart with angina pectoris ([place]) 12/19/2018   Obesity with body mass index (BMI) of 30.0 to 39.9 01/15/2018   Chronic idiopathic constipation 12/27/2017   Atrial fibrillation ([place]) 10/30/2017   Type 2 diabetes mellitus ([place]) 10/30/2017   Eczema 08/07/2017   Insomnia secondary to chronic pain 08/04/2016   Visit for screening mammogram 04/14/2016   LV dysfunction    Routine general medical examination at a health care facility 05/03/2013   DM (diabetes mellitus), type 2 with peripheral vascular complications ([place]) 37/85/8850   PVC's/nonsustained VT 10/24/2012   H/O: CVA (cerebrovascular accident) 02/09/2012   Hypothyroidism 05/27/2011   Fibromyalgia 10/08/2010   Long term current use of anticoagulant 05/05/2010   Low back pain, non-specific 05/12/2008   Cognitive dysfunction associated with depression 01/29/2007   Osteoarthritis 11/22/2006   Hyperlipidemia associated with type 2 diabetes mellitus ([place]) 09/13/2006   Coronary atherosclerosis 09/13/2006   Paroxysmal atrial fibrillation ([place]) 09/13/2006   GERD 09/13/2006   Hypertension associated with diabetes ([place]) 08/30/2006    Goals Addressed   None     Follow-Up:  Coordination of Enhanced Pharmacy Services   Reviewed chart for medication changes ahead of  medication coordination call.  No OVs, Consults, or hospital visits since last care coordination call/Pharmacist visit.  No medication changes indicated   BP Readings from Last 3 Encounters:  11/19/19 104/66  10/16/19 112/72  10/10/19 120/70    Lab Results  Component Value Date   HGBA1C 8.3 (H) 10/10/2019     Patient obtains medications through [facility]  90 Days   Last adherence delivery included:  Warfarin 3 mg 1 tab daily as directed Metformin 500 mg take two tab by mouth twice daily with food  Patient declined spironolactone, mag oxide, atorvastatin, telmisartan, metoprolol, potassium Cl, one touch test strips,gabapentin,  last month due to PRN use/additional supply on hand. Explanation of abundance on hand (ie #30 due to overlapping fills or previous adherence issues etc)  Patient is due for next adherence delivery on: 03/31/2020. Called patient and reviewed medications and coordinated delivery.  This delivery to include: Gabapentin 300 mg 1 cap by mouth three times daily breakfast, lunch, and  dinner Magnesium oxide 400 mg 1 tab by mouth twice daily with breakfast and evening meals Atorvastatin 40 mg 1 tab daily Telmisartan 40 mg 1 tab daily Metoprolol succinate ER 100 mg 1 tab twice daily breakfast and bedtime Potassium cl er 20 meq 1 tab twice daily breakfast and bedtime Furosemide 20 mg 1 tab daily breakfast Metformin 500 mg 1 tabs by mouth twice daily with food One touch Ultra Blue test strips use to check blood sugar twice daily   Patient needs refills for Furosemide prescribed by Dr. Ronnald Ramp  Confirmed delivery date of 03/31/2020, advised patient that pharmacy will contact them the morning of delivery.   Wendy Poet, [place] [phone]

## 2020-03-16 NOTE — Telephone Encounter (Signed)
Noted  

## 2020-03-17 ENCOUNTER — Telehealth: Payer: Self-pay | Admitting: Pharmacist

## 2020-03-17 NOTE — Progress Notes (Signed)
Chronic Care Management Pharmacy Assistant   Name: Tawsha Terrero  MRN: [id number] DOB: 03-25-1945  Reason for Encounter: Medication Question / PAP   PCP : [name], MD  Allergies:   Allergies  Allergen Reactions  . Metformin And Related Other (See Comments)    Must take XR form only, cannot tolerate Regular release metformin  . Cleocin [Clindamycin Hcl] Diarrhea  . Codeine Itching  . Doxycycline Hyclate Diarrhea  . Macrolides And Ketolides Diarrhea  . Morphine Hives  . Pentazocine Lactate Itching and Nausea And Vomiting    (GENERIC- Talwin)  . Vibramycin [Doxycycline Calcium] Diarrhea and Rash  . Clindamycin Diarrhea  . Definity [Perflutren Lipid Microsphere] Other (See Comments)    Patient complained of warm sensation in chest x few seconds duration when injected Definity.No other symptoms  . Doxycycline Diarrhea  . Metformin Other (See Comments)  . Pentazocine Nausea Only  . Perflutren Other (See Comments)  . Perflutren Lipid Microspheres Other (See Comments)  . Sulfa Antibiotics Diarrhea and Rash  . Sulfonamide Derivatives Rash    Medications: Outpatient Encounter Medications as of 03/17/2020  Medication Sig Note  . ALPRAZolam (XANAX) 1 MG tablet Take 1 tablet (1 mg total) by mouth 3 (three) times daily as needed for anxiety.   . Ascorbic Acid (VITAMIN C) 1000 MG tablet Take 1,000 mg by mouth daily.   Marland Kitchen atorvastatin (LIPITOR) 40 MG tablet TAKE ONE TABLET BY MOUTH DAILY   . Blood Glucose Monitoring Suppl (ONE TOUCH ULTRA 2) w/Device KIT Use as advised   . Cholecalciferol (VITAMIN D3) 50 MCG (2000 UT) capsule Take 2,000 Units by mouth daily.   . cyclobenzaprine (FLEXERIL) 10 MG tablet Take 10 mg by mouth 3 (three) times daily as needed for muscle spasms.   . empagliflozin (JARDIANCE) 25 MG TABS tablet Take 25 mg by mouth daily.   . furosemide (LASIX) 20 MG tablet Take 1 tablet (20 mg total) by mouth daily.   Marland Kitchen gabapentin (NEURONTIN) 300 MG capsule TAKE  ONE CAPSULE BY MOUTH THREE TIMES DAILY   . glucose blood (COOL BLOOD GLUCOSE TEST STRIPS) test strip Use to test blood sugar twice daily. DX: E11.9   . insulin glargine, 2 Unit Dial, (TOUJEO MAX SOLOSTAR) 300 UNIT/ML Solostar Pen Inject 50 Units into the skin daily.   . Insulin Pen Needle (NOVOFINE) 32G X 6 MM MISC 1 Act by Does not apply route daily.   . Lancets (ONETOUCH ULTRASOFT) lancets Use as instructed   . magnesium oxide (MAG-OX) 400 MG tablet TAKE ONE TABLET BY MOUTH BY MOUTH TWICE DAILY WITH BREAKFAST AND EVENING MEAL]   . metFORMIN (GLUCOPHAGE-XR) 500 MG 24 hr tablet TAKE TWO TABLETS (1,$RemoveBefore'000mg'[id number]$  total) BY MOUTH TWICE DAILY WITH A MEAL   . metoprolol succinate (TOPROL-XL) 100 MG 24 hr tablet Take 1 tablet (100 mg total) by mouth 2 (two) times daily. Take with or immediately following a meal.   . Multiple Vitamin (MULTIVITAMIN) tablet Take 1 tablet by mouth daily.   . nitroGLYCERIN (NITROSTAT) 0.4 MG SL tablet Place 1 tablet (0.4 mg total) under the tongue every 5 (five) minutes as needed for chest pain (x 3 doses). Reported on 05/08/2015   . ondansetron (ZOFRAN-ODT) 4 MG disintegrating tablet Take 1 tablet (4 mg total) by mouth every 8 (eight) hours as needed for nausea or vomiting.   Marland Kitchen PERCOCET 10-325 MG tablet Take 1 tablet by mouth 5 (five) times daily as needed.   . potassium chloride SA (KLOR-CON)  20 MEQ tablet Take 1 tablet (20 mEq total) by mouth 2 (two) times daily.   . Semaglutide, 1 MG/DOSE, (OZEMPIC, 1 MG/DOSE,) 4 MG/3ML SOPN Inject 1 mg into the skin once a week. 01/03/2020: [facility] PAP  . spironolactone (ALDACTONE) 25 MG tablet Take 1 tablet (25 mg total) by mouth 2 (two) times daily. (Patient taking differently: Take 25 mg by mouth daily.)   . telmisartan (MICARDIS) 40 MG tablet TAKE ONE TABLET BY MOUTH DAILY   . thiamine 100 MG tablet Take 1 tablet (100 mg total) by mouth daily.   Marland Kitchen triamcinolone cream (KENALOG) 0.5 % Apply 1 application topically 3 (three) times daily.    Marland Kitchen warfarin (COUMADIN) 3 MG tablet TAKE ONE TABLET BY MOUTH ONCE DAILY OR USE AS DIRECTED by clinic    No facility-administered encounter medications on file as of 03/17/2020.    Current Diagnosis: Patient Active Problem List   Diagnosis Date Noted  . Thiamine deficiency 12/30/2018  . Vitamin B12 deficiency anemia due to intrinsic factor deficiency 12/20/2018  . CHF (congestive heart failure), NYHA class I, chronic, combined ([place]) 12/19/2018  . Coronary artery disease involving native coronary artery of native heart with angina pectoris ([place]) 12/19/2018  . Obesity with body mass index (BMI) of 30.0 to 39.9 01/15/2018  . Chronic idiopathic constipation 12/27/2017  . Atrial fibrillation ([place]) 10/30/2017  . Type 2 diabetes mellitus ([place]) 10/30/2017  . Eczema 08/07/2017  . Insomnia secondary to chronic pain 08/04/2016  . Visit for screening mammogram 04/14/2016  . LV dysfunction   . Routine general medical examination at a health care facility 05/03/2013  . DM (diabetes mellitus), type 2 with peripheral vascular complications ([place]) 72/53/6644  . PVC's/nonsustained VT 10/24/2012  . H/O: CVA (cerebrovascular accident) 02/09/2012  . Hypothyroidism 05/27/2011  . Fibromyalgia 10/08/2010  . Long term current use of anticoagulant 05/05/2010  . Low back pain, non-specific 05/12/2008  . Cognitive dysfunction associated with depression 01/29/2007  . Osteoarthritis 11/22/2006  . Hyperlipidemia associated with type 2 diabetes mellitus ([place]) 09/13/2006  . Coronary atherosclerosis 09/13/2006  . Paroxysmal atrial fibrillation ([place]) 09/13/2006  . GERD 09/13/2006  . Hypertension associated with diabetes ([place]) 08/30/2006    Goals Addressed   None     Follow-Up:  Pharmacist Review    The patient called asking about Jardaince sample she also wanted to know about re-enrollment into the PAP program  For her Jardanice. I was able to speak with clinical pharmacist [name] she informed me that she  does have some samples at Dr. Ronnald Ramp office she is able to come by any day after 3pm.   Called the patient back she was informed that she or her husband will be able to go to the offivce to pick up her medication samples and to drop off a copy of her income as well.   Rosendo Gros, [facility]  Practice Team Manager/ CPA (Clinical Pharmacist Assistant) [phone]

## 2020-03-19 ENCOUNTER — Other Ambulatory Visit: Payer: Self-pay | Admitting: Cardiovascular Disease

## 2020-03-19 DIAGNOSIS — I480 Paroxysmal atrial fibrillation: Secondary | ICD-10-CM

## 2020-03-19 MED ORDER — POTASSIUM CHLORIDE CRYS ER 20 MEQ PO TBCR: 20.0000 meq | EXTENDED_RELEASE_TABLET | Freq: Two times a day (BID) | ORAL | 2 refills | Status: DC

## 2020-03-19 MED ORDER — METOPROLOL SUCCINATE ER 100 MG PO TB24: 100.0000 mg | ORAL_TABLET | Freq: Two times a day (BID) | ORAL | 2 refills | Status: DC

## 2020-03-23 ENCOUNTER — Other Ambulatory Visit: Payer: Self-pay | Admitting: *Deleted

## 2020-03-23 NOTE — Patient Outreach (Signed)
[facility]) Care Management  03/23/2020  Illana Nolting 1945-08-08 [id number]   CSW spoke with pt who is prepared for her virtual/initial counseling appointment.  Pt also is prepared for her PCP visit later this week in person at Dr Ronnald Ramp' office.   Pt shared with CSW she was able to view her upcomign visit with Dr Kathleene Hazel on [place] as well.   CSW offered support and encouragement to pt.  CSW will touch base on 03/27/2020 for follow up and further assessment of needs.  Eduard Clos, MSW, [place] Worker  [place] [phone]

## 2020-03-24 ENCOUNTER — Ambulatory Visit (INDEPENDENT_AMBULATORY_CARE_PROVIDER_SITE_OTHER): Payer: Medicare Other | Admitting: Clinical

## 2020-03-24 ENCOUNTER — Other Ambulatory Visit: Payer: Self-pay

## 2020-03-24 DIAGNOSIS — F4320 Adjustment disorder, unspecified: Secondary | ICD-10-CM
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
## 2020-03-26 ENCOUNTER — Ambulatory Visit: Payer: Medicare Other | Admitting: Internal Medicine

## 2020-03-26 ENCOUNTER — Other Ambulatory Visit: Payer: Self-pay

## 2020-03-26 ENCOUNTER — Encounter: Payer: Self-pay | Admitting: Internal Medicine

## 2020-03-26 ENCOUNTER — Ambulatory Visit (INDEPENDENT_AMBULATORY_CARE_PROVIDER_SITE_OTHER): Payer: Medicare Other | Admitting: Internal Medicine

## 2020-03-26 VITALS — BP 138/70 | HR 98 | Temp 98.0°F | Ht 61.0 in | Wt 212.0 lb

## 2020-03-26 DIAGNOSIS — E1151 Type 2 diabetes mellitus with diabetic peripheral angiopathy without gangrene: Secondary | ICD-10-CM

## 2020-03-26 DIAGNOSIS — Z7689 Persons encountering health services in other specified circumstances: Secondary | ICD-10-CM | POA: Diagnosis not present

## 2020-03-26 DIAGNOSIS — I25119 Atherosclerotic heart disease of native coronary artery with unspecified angina pectoris: Secondary | ICD-10-CM | POA: Diagnosis not present

## 2020-03-26 LAB — POCT GLYCOSYLATED HEMOGLOBIN (HGB A1C): Hemoglobin A1C: 8 % — AB (ref 4.0–5.6)

## 2020-03-26 MED ORDER — OZEMPIC (1 MG/DOSE) 2 MG/1.5ML ~~LOC~~ SOPN: 2.0000 mg | PEN_INJECTOR | SUBCUTANEOUS | 1 refills | Status: DC

## 2020-03-26 NOTE — Patient Instructions (Signed)
Type 2 Diabetes Mellitus, Diagnosis, Adult Type 2 diabetes (type 2 diabetes mellitus) is a long-term, or chronic, disease. In type 2 diabetes, one or both of these problems may be present:  The pancreas does not make enough of a hormone called insulin.  Cells in the body do not respond properly to insulin that the body makes (insulin resistance). Normally, insulin allows blood sugar (glucose) to enter cells in the body. The cells use glucose for energy. Insulin resistance or lack of insulin causes excess glucose to build up in the blood instead of going into cells. This causes high blood glucose (hyperglycemia).  What are the causes? The exact cause of type 2 diabetes is not known. What increases the risk? The following factors may make you more likely to develop this condition:  Having a family member with type 2 diabetes.  Being overweight or obese.  Being inactive (sedentary).  Having been diagnosed with insulin resistance.  Having a history of prediabetes, diabetes when you were pregnant (gestational diabetes), or polycystic ovary syndrome (PCOS). What are the signs or symptoms? In the early stage of this condition, you may not have symptoms. Symptoms develop slowly and may include:  Increased thirst or hunger.  Increased urination.  Unexplained weight loss.  Tiredness (fatigue) or weakness.  Vision changes, such as blurry vision.  Dark patches on the skin. How is this diagnosed? This condition is diagnosed based on your symptoms, your medical history, a physical exam, and your blood glucose level. Your blood glucose may be checked with one or more of the following blood tests:  A fasting blood glucose (FBG) test. You will not be allowed to eat (you will fast) for 8 hours or longer before a blood sample is taken.  A random blood glucose test. This test checks blood glucose at any time of day regardless of when you ate.  An A1C (hemoglobin A1C) blood test. This test  provides information about blood glucose levels over the previous 2-3 months.  An oral glucose tolerance test (OGTT). This test measures your blood glucose at two times: ? After fasting. This is your baseline blood glucose level. ? Two hours after drinking a beverage that contains glucose. You may be diagnosed with type 2 diabetes if:  Your fasting blood glucose level is 126 mg/dL (7.0 mmol/L) or higher.  Your random blood glucose level is 200 mg/dL (11.1 mmol/L) or higher.  Your A1C level is 6.5% or higher.  Your oral glucose tolerance test result is higher than 200 mg/dL (11.1 mmol/L). These blood tests may be repeated to confirm your diagnosis.   How is this treated? Your treatment may be managed by a specialist called an endocrinologist. Type 2 diabetes may be treated by following instructions from your health care provider about:  Making dietary and lifestyle changes. These may include: ? Following a personalized nutrition plan that is developed by a registered dietitian. ? Exercising regularly. ? Finding ways to manage stress.  Checking your blood glucose level as often as told.  Taking diabetes medicines or insulin daily. This helps to keep your blood glucose levels in the healthy range.  Taking medicines to help prevent complications from diabetes. Medicines may include: ? Aspirin. ? Medicine to lower cholesterol. ? Medicine to control blood pressure. Your health care provider will set treatment goals for you. Your goals will be based on your age, other medical conditions you have, and how you respond to diabetes treatment. Generally, the goal of treatment is to maintain the   following blood glucose levels:  Before meals: 80-130 mg/dL (4.4-7.2 mmol/L).  After meals: below 180 mg/dL (10 mmol/L).  A1C level: less than 7%. Follow these instructions at home: Questions to ask your health care provider Consider asking the following questions:  Should I meet with a certified  diabetes care and education specialist?  What diabetes medicines do I need, and when should I take them?  What equipment will I need to manage my diabetes at home?  How often do I need to check my blood glucose?  Where can I find a support group for people with diabetes?  What number can I call if I have questions?  When is my next appointment? General instructions  Take over-the-counter and prescription medicines only as told by your health care provider.  Keep all follow-up visits as told by your health care provider. This is important. Where to find more information  American Diabetes Association (ADA): www.diabetes.org  American Association of Diabetes Care and Education Specialists (ADCES): www.diabeteseducator.org  International Diabetes Federation (IDF): www.idf.org Contact a health care provider if:  Your blood glucose is at or above 240 mg/dL (13.3 mmol/L) for 2 days in a row.  You have been sick or have had a fever for 2 days or longer, and you are not getting better.  You have any of the following problems for more than 6 hours: ? You cannot eat or drink. ? You have nausea and vomiting. ? You have diarrhea. Get help right away if:  You have severe hypoglycemia. This means your blood glucose is lower than 54 mg/dL (3.0 mmol/L).  You become confused or you have trouble thinking clearly.  You have difficulty breathing.  You have moderate or large ketone levels in your urine. These symptoms may represent a serious problem that is an emergency. Do not wait to see if the symptoms will go away. Get medical help right away. Call your local emergency services (911 in the [place] Do not drive yourself to the hospital. Summary  Type 2 diabetes (type 2 diabetes mellitus) is a long-term, or chronic, disease. In type 2 diabetes, the pancreas does not make enough of a hormone called insulin, or cells in the body do not respond properly to insulin that the body makes (insulin  resistance).  This condition is treated by making dietary and lifestyle changes and taking diabetes medicines or insulin.  Your health care provider will set treatment goals for you. Your goals will be based on your age, other medical conditions you have, and how you respond to diabetes treatment.  Keep all follow-up visits as told by your health care provider. This is important. This information is not intended to replace advice given to you by your health care provider. Make sure you discuss any questions you have with your health care provider. Document Revised: 09/18/2019 Document Reviewed: 09/18/2019 Elsevier Patient Education  2021 [employer]  

## 2020-03-26 NOTE — Progress Notes (Signed)
Subjective:  Patient ID: Kristen [name], female    DOB: [birth year]  Age: 75 y.o. MRN: [id number]  CC: Diabetes  This visit occurred during the SARS-CoV-2 public health emergency.  Safety protocols were in place, including screening questions prior to the visit, additional usage of staff PPE, and extensive cleaning of exam room while observing appropriate contact time as indicated for disinfecting solutions.    HPI Analyah [name] presents for f/up - She does not exercise because she has chronic knee pain.  She is frustrated that she has gained weight.  She tells me her blood sugars have been relatively well controlled.  Her compliance with her medications is pretty good but not perfect.  She denies polys.  She has poor dentition and would like to be referred to an oral surgeon.  Outpatient Medications Prior to Visit  Medication Sig Dispense Refill  . ALPRAZolam (XANAX) 1 MG tablet Take 1 tablet (1 mg total) by mouth 3 (three) times daily as needed for anxiety. 90 tablet 5  . Ascorbic Acid (VITAMIN C) 1000 MG tablet Take 1,000 mg by mouth daily.    Marland Kitchen atorvastatin (LIPITOR) 40 MG tablet TAKE ONE TABLET BY MOUTH DAILY 90 tablet 1  . Blood Glucose Monitoring Suppl (ONE TOUCH ULTRA 2) w/Device KIT Use as advised 1 each 0  . Cholecalciferol (VITAMIN D3) 50 MCG (2000 UT) capsule Take 2,000 Units by mouth daily.    . cyclobenzaprine (FLEXERIL) 10 MG tablet Take 10 mg by mouth 3 (three) times daily as needed for muscle spasms.    . empagliflozin (JARDIANCE) 25 MG TABS tablet Take 25 mg by mouth daily. 90 tablet 3  . furosemide (LASIX) 20 MG tablet Take 1 tablet (20 mg total) by mouth daily. 90 tablet 1  . gabapentin (NEURONTIN) 300 MG capsule TAKE ONE CAPSULE BY MOUTH THREE TIMES DAILY 270 capsule 0  . glucose blood (COOL BLOOD GLUCOSE TEST STRIPS) test strip Use to test blood sugar twice daily. DX: E11.9 100 each 12  . insulin glargine, 2 Unit Dial, (TOUJEO MAX SOLOSTAR) 300 UNIT/ML  Solostar Pen Inject 50 Units into the skin daily. 15 mL 5  . Insulin Pen Needle (NOVOFINE) 32G X 6 MM MISC 1 Act by Does not apply route daily. 100 each 3  . Lancets (ONETOUCH ULTRASOFT) lancets Use as instructed 100 each 12  . magnesium oxide (MAG-OX) 400 MG tablet TAKE ONE TABLET BY MOUTH BY MOUTH TWICE DAILY WITH BREAKFAST AND EVENING MEAL] 180 tablet 2  . metFORMIN (GLUCOPHAGE-XR) 500 MG 24 hr tablet TAKE TWO TABLETS (1,071m total) BY MOUTH TWICE DAILY WITH A MEAL 360 tablet 0  . metoprolol succinate (TOPROL-XL) 100 MG 24 hr tablet Take 1 tablet (100 mg total) by mouth 2 (two) times daily. Take with or immediately following a meal. 180 tablet 2  . Multiple Vitamin (MULTIVITAMIN) tablet Take 1 tablet by mouth daily.    . nitroGLYCERIN (NITROSTAT) 0.4 MG SL tablet Place 1 tablet (0.4 mg total) under the tongue every 5 (five) minutes as needed for chest pain (x 3 doses). Reported on 05/08/2015 35 tablet 2  . ondansetron (ZOFRAN-ODT) 4 MG disintegrating tablet Take 1 tablet (4 mg total) by mouth every 8 (eight) hours as needed for nausea or vomiting. 60 tablet 2  . PERCOCET 10-325 MG tablet Take 1 tablet by mouth 5 (five) times daily as needed.    . potassium chloride SA (KLOR-CON) 20 MEQ tablet Take 1 tablet (20 mEq total) by mouth  2 (two) times daily. 180 tablet 2  . spironolactone (ALDACTONE) 25 MG tablet Take 1 tablet (25 mg total) by mouth 2 (two) times daily. (Patient taking differently: Take 25 mg by mouth daily.) 180 tablet 1  . telmisartan (MICARDIS) 40 MG tablet TAKE ONE TABLET BY MOUTH DAILY 90 tablet 1  . thiamine 100 MG tablet Take 1 tablet (100 mg total) by mouth daily. 90 tablet 1  . triamcinolone cream (KENALOG) 0.5 % Apply 1 application topically 3 (three) times daily. 60 g 3  . warfarin (COUMADIN) 3 MG tablet TAKE ONE TABLET BY MOUTH ONCE DAILY OR USE AS DIRECTED by clinic 60 tablet 3  . Semaglutide, 1 MG/DOSE, (OZEMPIC, 1 MG/DOSE,) 4 MG/3ML SOPN Inject 1 mg into the skin once a  week.     No facility-administered medications prior to visit.    ROS Review of Systems  Constitutional: Positive for unexpected weight change (wt gain). Negative for appetite change, chills, diaphoresis and fatigue.  HENT: Negative.   Eyes: Negative.   Respiratory: Negative for cough, chest tightness, shortness of breath and wheezing.   Cardiovascular: Negative for chest pain, palpitations and leg swelling.  Gastrointestinal: Negative for abdominal pain, constipation, diarrhea and nausea.  Endocrine: Negative for polydipsia, polyphagia and polyuria.  Genitourinary: Negative.  Negative for difficulty urinating.  Musculoskeletal: Positive for arthralgias. Negative for myalgias.  Skin: Negative.  Negative for color change, pallor and rash.  Neurological: Negative.  Negative for dizziness, weakness, light-headedness and headaches.  Hematological: Negative for adenopathy. Does not bruise/bleed easily.  Psychiatric/Behavioral: Negative.     Objective:  BP 138/70 (BP Location: Left Arm, Patient Position: Sitting, Cuff Size: Large)   Pulse 98   Temp 98 F (36.7 C) (Oral)   Ht _0  (1.549 m)   Wt 212 lb (96.2 kg)   SpO2 97%   BMI 40.06 kg/m   BP Readings from Last 3 Encounters:  03/26/20 138/70  11/19/19 104/66  10/16/19 112/72    Wt Readings from Last 3 Encounters:  03/26/20 212 lb (96.2 kg)  11/19/19 210 lb (95.3 kg)  10/16/19 211 lb (95.7 kg)    Physical Exam Vitals reviewed.  HENT:     Nose: Nose normal.     Mouth/Throat:     Mouth: Mucous membranes are moist.  Eyes:     General: No scleral icterus.    Conjunctiva/sclera: Conjunctivae normal.  Cardiovascular:     Rate and Rhythm: Normal rate and regular rhythm.     Heart sounds: No murmur heard.   Pulmonary:     Effort: Pulmonary effort is normal.     Breath sounds: No stridor. No wheezing, rhonchi or rales.  Abdominal:     General: Abdomen is protuberant. Bowel sounds are normal. There is no distension.      Palpations: Abdomen is soft. There is no hepatomegaly, splenomegaly or mass.     Tenderness: There is no abdominal tenderness.  Musculoskeletal:        General: Normal range of motion.     Cervical back: Neck supple.     Right lower leg: No edema.     Left lower leg: No edema.  Skin:    General: Skin is warm and dry.  Neurological:     General: No focal deficit present.  Psychiatric:        Mood and Affect: Mood normal.        Behavior: Behavior normal.     Lab Results  Component Value Date  WBC 6.0 10/10/2019   HGB 12.2 10/10/2019   HCT 39.8 10/10/2019   PLT 308 10/10/2019   GLUCOSE 122 (H) 10/16/2019   CHOL 126 04/11/2019   TRIG 123.0 04/11/2019   HDL 36.70 (L) 04/11/2019   LDLDIRECT 116.0 11/28/2008   LDLCALC 65 04/11/2019   ALT 13 04/11/2019   AST 12 04/11/2019   NA 139 10/16/2019   K 4.3 10/16/2019   CL 103 10/16/2019   CREATININE 0.90 10/16/2019   BUN 18 10/16/2019   CO2 21 10/16/2019   TSH 1.55 10/10/2019   INR 2.0 03/03/2020   HGBA1C 8.0 (A) 03/26/2020   MICROALBUR <0.7 04/11/2019    MM 3D SCREEN BREAST BILATERAL  Result Date: 12/27/2019 CLINICAL DATA:  Screening. EXAM: DIGITAL SCREENING BILATERAL MAMMOGRAM WITH TOMO AND CAD COMPARISON:  Previous exam(s). ACR Breast Density Category b: There are scattered areas of fibroglandular density. FINDINGS: There are no findings suspicious for malignancy. Images were processed with CAD. IMPRESSION: No mammographic evidence of malignancy. A result letter of this screening mammogram will be mailed directly to the patient. RECOMMENDATION: Screening mammogram in one year. (Code:SM-B-01Y) BI-RADS CATEGORY  1: Negative. Electronically Signed   By: Nolon Nations M.D.   On: 12/27/2019 13:16    Assessment & Plan:   Rickesha was seen today for diabetes.  Diagnoses and all orders for this visit:  DM (diabetes mellitus), type 2 with peripheral vascular complications ([place])- Her A1c is better at 8.0% but is still too  high.  I will double the dose of semaglutide and she will be more compliant with her oral glycemic agents. -     POCT glycosylated hemoglobin (Hb A1C) -     Semaglutide, 1 MG/DOSE, (OZEMPIC, 1 MG/DOSE,) 2 MG/1.5ML SOPN; Inject 2 mg into the skin once a week.  Coronary artery disease involving native coronary artery of native heart with angina pectoris ([place])- I recommended a higher dose of semaglutide for cardiovascular benefit. -     Semaglutide, 1 MG/DOSE, (OZEMPIC, 1 MG/DOSE,) 2 MG/1.5ML SOPN; Inject 2 mg into the skin once a week.  Poor dentition requiring referral to dentistry -     Ambulatory referral to Oral Maxillofacial Surgery   I have discontinued Kristen Dakin Dick "Pat"'s Ozempic (1 MG/DOSE). I am also having her start on Ozempic (1 MG/DOSE). Additionally, I am having her maintain her ONE TOUCH ULTRA 2, triamcinolone cream, Jardiance, ondansetron, Percocet, multivitamin, vitamin C, Vitamin D3, thiamine, spironolactone, NovoFine, furosemide, onetouch ultrasoft, Cool Blood Glucose Test Strips, magnesium oxide, Toujeo Max SoloStar, ALPRAZolam, telmisartan, atorvastatin, gabapentin, metFORMIN, warfarin, cyclobenzaprine, nitroGLYCERIN, metoprolol succinate, and potassium chloride SA.  Meds ordered this encounter  Medications  . Semaglutide, 1 MG/DOSE, (OZEMPIC, 1 MG/DOSE,) 2 MG/1.5ML SOPN    Sig: Inject 2 mg into the skin once a week.    Dispense:  18 mL    Refill:  1     Follow-up: Return in about 3 months (around 06/24/2020).  Scarlette Calico, MD

## 2020-03-27 ENCOUNTER — Other Ambulatory Visit: Payer: Self-pay | Admitting: *Deleted

## 2020-03-27 ENCOUNTER — Encounter: Payer: Self-pay | Admitting: Internal Medicine

## 2020-03-27 NOTE — Patient Outreach (Signed)
Care Management Clinical Social Work Note  03/27/2020 Name: Kristen [name] MRN: [id number] DOB: 05/02/1945  Kristen [name] is a 75 y.o. year old female who is a primary care patient of [name], MD.  The Care Management team was consulted for assistance with chronic disease management and coordination needs.  Engaged with patient by telephone for follow up visit   Assessment:  Pt reports she was able to have a virtual counseling session with "Ms Kristen Gula" and it went well. She is awaiting a call for her follow up appointment to be scheduled. Pt reports things at home are "fine" and no recent or current concerns or needs. Advanced Directives Status: Not addressed in this encounter.    Allergies  Allergen Reactions  . Metformin And Related Other (See Comments)    Must take XR form only, cannot tolerate Regular release metformin  . Cleocin [Clindamycin Hcl] Diarrhea  . Codeine Itching  . Doxycycline Hyclate Diarrhea  . Macrolides And Ketolides Diarrhea  . Morphine Hives  . Pentazocine Lactate Itching and Nausea And Vomiting    (GENERIC- [place])  . Vibramycin [Doxycycline Calcium] Diarrhea and Rash  . Clindamycin Diarrhea  . Definity [Perflutren Lipid Microsphere] Other (See Comments)    Patient complained of warm sensation in chest x few seconds duration when injected Definity.No other symptoms  . Doxycycline Diarrhea  . Metformin Other (See Comments)  . Pentazocine Nausea Only  . Perflutren Other (See Comments)  . Perflutren Lipid Microspheres Other (See Comments)  . Sulfa Antibiotics Diarrhea and Rash  . Sulfonamide Derivatives Rash    Outpatient Encounter Medications as of 03/27/2020  Medication Sig  . ALPRAZolam (XANAX) 1 MG tablet Take 1 tablet (1 mg total) by mouth 3 (three) times daily as needed for anxiety.  . Ascorbic Acid (VITAMIN C) 1000 MG tablet Take 1,000 mg by mouth daily.  Marland Kitchen atorvastatin (LIPITOR) 40 MG tablet TAKE ONE TABLET BY MOUTH DAILY  .  Blood Glucose Monitoring Suppl (ONE TOUCH ULTRA 2) w/Device KIT Use as advised  . Cholecalciferol (VITAMIN D3) 50 MCG (2000 UT) capsule Take 2,000 Units by mouth daily.  . cyclobenzaprine (FLEXERIL) 10 MG tablet Take 10 mg by mouth 3 (three) times daily as needed for muscle spasms.  . empagliflozin (JARDIANCE) 25 MG TABS tablet Take 25 mg by mouth daily.  . furosemide (LASIX) 20 MG tablet Take 1 tablet (20 mg total) by mouth daily.  Marland Kitchen gabapentin (NEURONTIN) 300 MG capsule TAKE ONE CAPSULE BY MOUTH THREE TIMES DAILY  . glucose blood (COOL BLOOD GLUCOSE TEST STRIPS) test strip Use to test blood sugar twice daily. DX: E11.9  . insulin glargine, 2 Unit Dial, (TOUJEO MAX SOLOSTAR) 300 UNIT/ML Solostar Pen Inject 50 Units into the skin daily.  . Insulin Pen Needle (NOVOFINE) 32G X 6 MM MISC 1 Act by Does not apply route daily.  . Lancets (ONETOUCH ULTRASOFT) lancets Use as instructed  . magnesium oxide (MAG-OX) 400 MG tablet TAKE ONE TABLET BY MOUTH BY MOUTH TWICE DAILY WITH BREAKFAST AND EVENING MEAL]  . metFORMIN (GLUCOPHAGE-XR) 500 MG 24 hr tablet TAKE TWO TABLETS (1,$RemoveBefore'000mg'[id number]$  total) BY MOUTH TWICE DAILY WITH A MEAL  . metoprolol succinate (TOPROL-XL) 100 MG 24 hr tablet Take 1 tablet (100 mg total) by mouth 2 (two) times daily. Take with or immediately following a meal.  . Multiple Vitamin (MULTIVITAMIN) tablet Take 1 tablet by mouth daily.  . nitroGLYCERIN (NITROSTAT) 0.4 MG SL tablet Place 1 tablet (0.4 mg total) under the  tongue every 5 (five) minutes as needed for chest pain (x 3 doses). Reported on 05/08/2015  . ondansetron (ZOFRAN-ODT) 4 MG disintegrating tablet Take 1 tablet (4 mg total) by mouth every 8 (eight) hours as needed for nausea or vomiting.  Marland Kitchen PERCOCET 10-325 MG tablet Take 1 tablet by mouth 5 (five) times daily as needed.  . potassium chloride SA (KLOR-CON) 20 MEQ tablet Take 1 tablet (20 mEq total) by mouth 2 (two) times daily.  . Semaglutide, 1 MG/DOSE, (OZEMPIC, 1 MG/DOSE,) 2 MG/1.5ML  SOPN Inject 2 mg into the skin once a week.  . spironolactone (ALDACTONE) 25 MG tablet Take 1 tablet (25 mg total) by mouth 2 (two) times daily. (Patient taking differently: Take 25 mg by mouth daily.)  . telmisartan (MICARDIS) 40 MG tablet TAKE ONE TABLET BY MOUTH DAILY  . thiamine 100 MG tablet Take 1 tablet (100 mg total) by mouth daily.  Marland Kitchen triamcinolone cream (KENALOG) 0.5 % Apply 1 application topically 3 (three) times daily.  Marland Kitchen warfarin (COUMADIN) 3 MG tablet TAKE ONE TABLET BY MOUTH ONCE DAILY OR USE AS DIRECTED by clinic   No facility-administered encounter medications on file as of 03/27/2020.    Patient Active Problem List   Diagnosis Date Noted  . Poor dentition requiring referral to dentistry 03/26/2020  . Thiamine deficiency 12/30/2018  . Vitamin B12 deficiency anemia due to intrinsic factor deficiency 12/20/2018  . CHF (congestive heart failure), NYHA class I, chronic, combined ([place]) 12/19/2018  . Coronary artery disease involving native coronary artery of native heart with angina pectoris ([place]) 12/19/2018  . Obesity with body mass index (BMI) of 30.0 to 39.9 01/15/2018  . Chronic idiopathic constipation 12/27/2017  . Atrial fibrillation ([place]) 10/30/2017  . Type 2 diabetes mellitus ([place]) 10/30/2017  . Eczema 08/07/2017  . Insomnia secondary to chronic pain 08/04/2016  . Visit for screening mammogram 04/14/2016  . LV dysfunction   . Routine general medical examination at a health care facility 05/03/2013  . DM (diabetes mellitus), type 2 with peripheral vascular complications ([place]) 81/82/9937  . PVC's/nonsustained VT 10/24/2012  . H/O: CVA (cerebrovascular accident) 02/09/2012  . Hypothyroidism 05/27/2011  . Fibromyalgia 10/08/2010  . Long term current use of anticoagulant 05/05/2010  . Low back pain, non-specific 05/12/2008  . Cognitive dysfunction associated with depression 01/29/2007  . Osteoarthritis 11/22/2006  . Hyperlipidemia associated with type 2 diabetes  mellitus ([place]) 09/13/2006  . Coronary atherosclerosis 09/13/2006  . Paroxysmal atrial fibrillation ([place]) 09/13/2006  . GERD 09/13/2006  . Hypertension associated with diabetes ([place]) 08/30/2006    Conditions to be addressed/monitored: Anxiety and Depression; Family and relationship dysfunction and Social Isolation     Follow Up Plan: SW will follow up with patient by phone over the next 2-3 weeks  Eduard Clos, MSW, [place] Worker  [place] [phone]

## 2020-03-30 ENCOUNTER — Other Ambulatory Visit: Payer: Self-pay | Admitting: Internal Medicine

## 2020-03-30 DIAGNOSIS — I152 Hypertension secondary to endocrine disorders: Secondary | ICD-10-CM

## 2020-03-30 MED ORDER — FUROSEMIDE 20 MG PO TABS
20.0000 mg | ORAL_TABLET | Freq: Every morning | ORAL | 1 refills | Status: DC
Start: 2020-03-30 — End: 2020-11-30

## 2020-03-31 ENCOUNTER — Other Ambulatory Visit: Payer: Self-pay | Admitting: Internal Medicine

## 2020-03-31 DIAGNOSIS — E1151 Type 2 diabetes mellitus with diabetic peripheral angiopathy without gangrene: Secondary | ICD-10-CM

## 2020-03-31 MED ORDER — METFORMIN HCL ER 500 MG PO TB24: ORAL_TABLET | ORAL | 0 refills | Status: DC

## 2020-03-31 NOTE — Addendum Note (Signed)
Addended by: Hinda Kehr on: 03/31/2020 01:45 PM   Modules accepted: Orders

## 2020-03-31 NOTE — Telephone Encounter (Signed)
Received message from [facility] requesting refill for metformin ER 500 mg - 90 day supply.  Patient will be out of medication tomorrow.

## 2020-04-01 ENCOUNTER — Ambulatory Visit: Payer: Medicare Other | Admitting: Internal Medicine

## 2020-04-06 ENCOUNTER — Encounter: Payer: Self-pay | Admitting: Internal Medicine

## 2020-04-07 ENCOUNTER — Ambulatory Visit: Payer: Medicare Other | Admitting: Pharmacist

## 2020-04-07 ENCOUNTER — Other Ambulatory Visit: Payer: Self-pay

## 2020-04-07 DIAGNOSIS — I5042 Chronic combined systolic (congestive) and diastolic (congestive) heart failure: Secondary | ICD-10-CM

## 2020-04-07 DIAGNOSIS — E1151 Type 2 diabetes mellitus with diabetic peripheral angiopathy without gangrene: Secondary | ICD-10-CM

## 2020-04-07 DIAGNOSIS — E1159 Type 2 diabetes mellitus with other circulatory complications: Secondary | ICD-10-CM

## 2020-04-07 DIAGNOSIS — E1169 Type 2 diabetes mellitus with other specified complication: Secondary | ICD-10-CM

## 2020-04-07 DIAGNOSIS — I25119 Atherosclerotic heart disease of native coronary artery with unspecified angina pectoris: Secondary | ICD-10-CM

## 2020-04-07 DIAGNOSIS — I480 Paroxysmal atrial fibrillation: Secondary | ICD-10-CM

## 2020-04-07 NOTE — Chronic Care Management (AMB) (Signed)
Chronic Care Management Pharmacy Note  04/07/2020 Name:  Sharonne Ricketts MRN:  [id number] DOB:  [birth year]  Subjective: Kristen [name] is an 75 y.o. year old female who is a primary patient of [name], MD.  The CCM team was consulted for assistance with disease management and care coordination needs.    Engaged with patient by telephone for follow up visit in response to provider referral for pharmacy case management and/or care coordination services.   Consent to Services:  The patient was given the following information about Chronic Care Management services today, agreed to services, and gave verbal consent: 1. CCM service includes personalized support from designated clinical staff supervised by the primary care provider, including individualized plan of care and coordination with other care providers 2. 24/7 contact phone numbers for assistance for urgent and routine care needs. 3. Service will only be billed when office clinical staff spend 20 minutes or more in a month to coordinate care. 4. Only one practitioner may furnish and bill the service in a calendar month. 5.The patient may stop CCM services at any time (effective at the end of the month) by phone call to the office staff. 6. The patient will be responsible for cost sharing (co-pay) of up to 20% of the service fee (after annual deductible is met). Patient agreed to services and consent obtained.  Patient Care Team: [name], MD as PCP - General (Internal Medicine) Sherren Mocha, MD as PCP - Cardiology (Cardiology) Deboraha Sprang, MD as Consulting Physician (Cardiology) Charlton Haws, [facility] as Pharmacist (Pharmacist) Shon Hough, MD as Consulting Physician (Ophthalmology) Deirdre Peer, LCSW as [place] Management [place], Eppie Gibson, RN as [place] Management  Office Visits: 10/16/19 Dr Ronnald Ramp OV: bicarb now normal, no evidence of ketones.   10/10/19 Dr Ronnald Ramp OV: rx'd Freestyle Libre CGM. Bicarb is low, advised to RTC to check for ketonuria.  05/08/19 Mickel Baas FNP: allergic reaction to COVID vaccine f/u.  05/06/19 Pt message: requesting 30 day supply of Toujeo d/t cost 04/11/19 Dr Ronnald Ramp OV: wt gain, poor med compliance. Rec'd to stop glipizide and be more compliant with Victoza, SGLT-2 and insulin. Follows with cardiology for CHF.  Consult Visit:  12/11/19 Dr Casimiro Needle (psychiatry): stable, no med changes.  11/19/19 Dr Burt Knack (cardiology): f/u for CAD, PAF, HTN, HLD, No med changes  03/12/19 Dr Casimiro Needle (psychiatry): very stable, no med changes.  Objective:  Lab Results  Component Value Date   CREATININE 0.90 10/16/2019   BUN 18 10/16/2019   GFR 78.64 04/11/2019   GFRNONAA 64 10/16/2019   GFRAA 75 10/16/2019   NA 139 10/16/2019   K 4.3 10/16/2019   CALCIUM 10.2 10/16/2019   CO2 21 10/16/2019    Lab Results  Component Value Date/Time   HGBA1C 8.0 (A) 03/26/2020 02:32 PM   HGBA1C 8.3 (H) 10/10/2019 03:46 PM   HGBA1C 9.0 (A) 04/11/2019 02:39 PM   HGBA1C 8.4 (H) 08/07/2017 02:52 PM   GFR 78.64 04/11/2019 02:55 PM   GFR 65.38 12/19/2018 05:02 PM   MICROALBUR <0.7 04/11/2019 02:55 PM   MICROALBUR <0.7 12/28/2017 03:51 PM    Last diabetic Eye exam:  Lab Results  Component Value Date/Time   HMDIABEYEEXA No Retinopathy 03/21/2019 12:00 AM    Last diabetic Foot exam:  Lab Results  Component Value Date/Time   HMDIABFOOTEX normal 04/02/2014 12:00 AM     Lab Results  Component Value Date   CHOL 126 04/11/2019  HDL 36.70 (L) 04/11/2019   LDLCALC 65 04/11/2019   LDLDIRECT 116.0 11/28/2008   TRIG 123.0 04/11/2019   CHOLHDL 3 04/11/2019    Hepatic Function Latest Ref Rng & Units 04/11/2019 12/19/2018 12/28/2017  Total Protein 6.0 - 8.3 g/dL 8.0 7.8 7.8  Albumin 3.5 - 5.2 g/dL 4.4 4.9 4.4  AST 0 - 37 U/L $Remo'12 15 8  'XMlWr$ ALT 0 - 35 U/L $Remo'13 12 10  'mqnxn$ Alk Phosphatase 39 - 117 U/L 53 47 62  Total Bilirubin 0.2 - 1.2 mg/dL 0.4 1.5(H) 0.8   Bilirubin, Direct 0.0 - 0.3 mg/dL 0.1 0.3 -    Lab Results  Component Value Date/Time   TSH 1.55 10/10/2019 03:46 PM   TSH 1.75 04/11/2019 02:55 PM   FREET4 0.73 09/27/2013 01:28 PM   FREET4 0.68 05/07/2012 09:44 AM    CBC Latest Ref Rng & Units 10/10/2019 04/11/2019 12/19/2018  WBC 3.8 - 10.8 Thousand/uL 6.0 6.5 6.7  Hemoglobin 11.7 - 15.5 g/dL 12.2 13.0 11.5(L)  Hematocrit 35.0 - 45.0 % 39.8 41.3 34.1(L)  Platelets 140 - 400 Thousand/uL 308 398.0 292.0   Lab Results  Component Value Date/Time   INR 2.0 03/03/2020 12:00 AM   INR 2.5 01/21/2020 12:00 AM   INR 3.1 (H) 03/16/2016 11:48 AM   INR 2.21 02/05/2016 03:27 PM   INR 1.8 05/11/2010 01:39 PM   INR 2.1 04/12/2010 01:22 PM   No results found for: VD25OH  Clinical ASCVD: Yes  The ASCVD Risk score Mikey Bussing DC Jr., et al., 2013) failed to calculate for the following reasons:   The valid total cholesterol range is 130 to 320 mg/dL    CHA2DS2-VASc Score = 8  The patient's score is based upon: CHF History: Yes HTN History: Yes Diabetes History: Yes Stroke History: Yes Vascular Disease History: Yes  Social History   Tobacco Use  Smoking Status Former Smoker  . Quit date: 03/07/2001  . Years since quitting: 19.0  Smokeless Tobacco Never Used    BP Readings from Last 3 Encounters:  03/26/20 138/70  11/19/19 104/66  10/16/19 112/72   Pulse Readings from Last 3 Encounters:  03/26/20 98  11/19/19 95  10/16/19 78   Wt Readings from Last 3 Encounters:  03/26/20 212 lb (96.2 kg)  11/19/19 210 lb (95.3 kg)  10/16/19 211 lb (95.7 kg)    Assessment/Interventions: Review of patient past medical history, allergies, medications, health status, including review of consultants reports, laboratory and other test data, was performed as part of comprehensive evaluation and provision of chronic care management services.   SDOH:  (Social Determinants of Health) assessments and interventions performed:    CCM Care  Plan  Allergies  Allergen Reactions  . Metformin And Related Other (See Comments)    Must take XR form only, cannot tolerate Regular release metformin  . Cleocin [Clindamycin Hcl] Diarrhea  . Codeine Itching  . Doxycycline Hyclate Diarrhea  . Macrolides And Ketolides Diarrhea  . Morphine Hives  . Pentazocine Lactate Itching and Nausea And Vomiting    (GENERIC- [place])  . Vibramycin [Doxycycline Calcium] Diarrhea and Rash  . Clindamycin Diarrhea  . Definity [Perflutren Lipid Microsphere] Other (See Comments)    Patient complained of warm sensation in chest x few seconds duration when injected Definity.No other symptoms  . Doxycycline Diarrhea  . Metformin Other (See Comments)  . Pentazocine Nausea Only  . Perflutren Other (See Comments)  . Perflutren Lipid Microspheres Other (See Comments)  . Sulfa Antibiotics Diarrhea and Rash  .  Sulfonamide Derivatives Rash    Medications Reviewed Today    Reviewed by Kathyrn Sheriff, [facility] (Pharmacist) on 04/07/20 at 1712  Med List Status: <None>  Medication Order Taking? Sig Documenting Provider Last Dose Status Informant  ALPRAZolam (XANAX) 1 MG tablet [id number] Yes Take 1 tablet (1 mg total) by mouth 3 (three) times daily as needed for anxiety. Archer Asa, MD Taking Active   Ascorbic Acid (VITAMIN C) 1000 MG tablet [id number] Yes Take 1,000 mg by mouth daily. [provider] Taking Active   atorvastatin (LIPITOR) 40 MG tablet [id number] Yes TAKE ONE TABLET BY MOUTH DAILY Etta Grandchild, MD Taking Active   Blood Glucose Monitoring Suppl (ONE TOUCH ULTRA 2) w/Device KIT [id number] Yes Use as advised Carlus Pavlov, MD Taking Active   Cholecalciferol (VITAMIN D3) 50 MCG (2000 UT) capsule [id number] Yes Take 2,000 Units by mouth daily. [provider] Taking Active   cyclobenzaprine (FLEXERIL) 10 MG tablet [id number] Yes Take 10 mg by mouth 3 (three) times daily as needed for muscle spasms. [provider] Taking  Active Self  empagliflozin (JARDIANCE) 25 MG TABS tablet [id number] Yes Take 25 mg by mouth daily. Etta Grandchild, MD Taking Active   furosemide (LASIX) 20 MG tablet [id number] Yes Take 1 tablet (20 mg total) by mouth every morning. Etta Grandchild, MD Taking Active   gabapentin (NEURONTIN) 300 MG capsule [id number] Yes TAKE ONE CAPSULE BY MOUTH THREE TIMES DAILY Etta Grandchild, MD Taking Active   glucose blood (COOL BLOOD GLUCOSE TEST STRIPS) test strip [id number] Yes Use to test blood sugar twice daily. DX: E11.9 Etta Grandchild, MD Taking Active   insulin glargine, 2 Unit Dial, (TOUJEO MAX SOLOSTAR) 300 UNIT/ML Solostar Pen [id number] Yes Inject 50 Units into the skin daily. Etta Grandchild, MD Taking Active   Insulin Pen Needle (NOVOFINE) 32G X 6 MM MISC [id number] Yes 1 Act by Does not apply route daily. Etta Grandchild, MD Taking Active   Lancets [facility] ULTRASOFT) lancets [id number] Yes Use as instructed Etta Grandchild, MD Taking Active   magnesium oxide (MAG-OX) 400 MG tablet [id number] Yes TAKE ONE TABLET BY MOUTH BY MOUTH TWICE DAILY WITH BREAKFAST AND EVENING MEAL] Tonny Bollman, MD Taking Active   metFORMIN (GLUCOPHAGE-XR) 500 MG 24 hr tablet [id number] Yes TAKE TWO TABLETS BY MOUTH TWICE DAILY WITH FOOD Etta Grandchild, MD Taking Active   metoprolol succinate (TOPROL-XL) 100 MG 24 hr tablet [id number] Yes Take 1 tablet (100 mg total) by mouth 2 (two) times daily. Take with or immediately following a meal. Tonny Bollman, MD Taking Active   Multiple Vitamin (MULTIVITAMIN) tablet [id number] Yes Take 1 tablet by mouth daily. [provider] Taking Active   nitroGLYCERIN (NITROSTAT) 0.4 MG SL tablet [id number] Yes Place 1 tablet (0.4 mg total) under the tongue every 5 (five) minutes as needed for chest pain (x 3 doses). Reported on 05/08/2015 Tonny Bollman, MD Taking Active   ondansetron (ZOFRAN-ODT) 4 MG disintegrating tablet [id number] Yes Take 1 tablet (4 mg total) by mouth every 8  (eight) hours as needed for nausea or vomiting. Etta Grandchild, MD Taking Active   PERCOCET 10-325 MG tablet [id number] Yes Take 1 tablet by mouth 5 (five) times daily as needed. [provider] Taking Active   potassium chloride SA (KLOR-CON) 20 MEQ tablet [id number] Yes Take 1 tablet (20 mEq total) by mouth 2 (two) times daily. Tonny Bollman, MD Taking Active   Semaglutide, 1 MG/DOSE, (OZEMPIC, 1 MG/DOSE,)  2 MG/1.5ML SOPN [id number] Yes Inject 2 mg into the skin once a week.  Patient taking differently: Inject 1 mg into the skin once a week.   [name], MD Taking Active   spironolactone (ALDACTONE) 25 MG tablet [id number] Yes Take 1 tablet (25 mg total) by mouth 2 (two) times daily.  Patient taking differently: Take 25 mg by mouth daily.   [name], MD Taking Active   telmisartan (MICARDIS) 40 MG tablet [id number] Yes TAKE ONE TABLET BY MOUTH DAILY [name], MD Taking Active   thiamine 100 MG tablet [id number] Yes Take 1 tablet (100 mg total) by mouth daily. [name], MD Taking Active   triamcinolone cream (KENALOG) 0.5 % [id number] Yes Apply 1 application topically 3 (three) times daily. [name], MD Taking Active   warfarin (COUMADIN) 3 MG tablet [id number] Yes TAKE ONE TABLET BY MOUTH ONCE DAILY OR USE AS DIRECTED by clinic [name], MD Taking Active           Patient Active Problem List   Diagnosis Date Noted  . Poor dentition requiring referral to dentistry 03/26/2020  . Thiamine deficiency 12/30/2018  . Vitamin B12 deficiency anemia due to intrinsic factor deficiency 12/20/2018  . CHF (congestive heart failure), NYHA class I, chronic, combined ([place]) 12/19/2018  . Coronary artery disease involving native coronary artery of native heart with angina pectoris ([place]) 12/19/2018  . Obesity with body mass index (BMI) of 30.0 to 39.9 01/15/2018  . Chronic idiopathic constipation 12/27/2017  . Atrial fibrillation ([place]) 10/30/2017  . Type 2  diabetes mellitus ([place]) 10/30/2017  . Eczema 08/07/2017  . Insomnia secondary to chronic pain 08/04/2016  . Visit for screening mammogram 04/14/2016  . LV dysfunction   . Routine general medical examination at a health care facility 05/03/2013  . DM (diabetes mellitus), type 2 with peripheral vascular complications ([place]) 54/62/7035  . PVC's/nonsustained VT 10/24/2012  . H/O: CVA (cerebrovascular accident) 02/09/2012  . Hypothyroidism 05/27/2011  . Fibromyalgia 10/08/2010  . Long term current use of anticoagulant 05/05/2010  . Low back pain, non-specific 05/12/2008  . Cognitive dysfunction associated with depression 01/29/2007  . Osteoarthritis 11/22/2006  . Hyperlipidemia associated with type 2 diabetes mellitus ([place]) 09/13/2006  . Coronary atherosclerosis 09/13/2006  . Paroxysmal atrial fibrillation ([place]) 09/13/2006  . GERD 09/13/2006  . Hypertension associated with diabetes ([place]) 08/30/2006    Immunization History  Administered Date(s) Administered  . Fluad Quad(high Dose 65+) 11/16/2018, 12/10/2019  . Influenza Split 12/14/2010, 12/12/2011  . Influenza Whole 01/05/2006, 01/29/2007, 12/26/2007, 11/28/2008, 12/04/2009  . Influenza, High Dose Seasonal PF 11/12/2014, 01/26/2016, 11/25/2016, 12/27/2017  . Influenza,inj,Quad PF,6+ Mos 11/12/2012, 04/08/2014  . Moderna Sars-Covid-2 Vaccination 04/20/2019, 06/03/2019  . Pneumococcal Conjugate-13 08/09/2013  . Pneumococcal Polysaccharide-23 12/04/2009, 12/14/2010, 10/12/2016  . Td 03/07/2005  . Tdap 04/14/2016    Conditions to be addressed/monitored:  Atrial Fibrillation, CHF, CAD, HTN, HLD and DMII     Patient Care Plan: CCM Pharmacy Care Plan    Problem Identified: DM, HTN, HLD, CAD, CHF   Priority: High    Goal: Patient-Specific Goal   Start Date: 04/07/2020  Expected End Date: 07/05/2020  This Visit's Progress: On track  Priority: High  Note:   Current Barriers:  . Unable to independently afford treatment  regimen . Unable to independently monitor therapeutic efficacy . Unable to achieve control of Diabetes   Pharmacist Clinical Goal(s):  Marland Kitchen Over the next 90 days, patient will verbalize ability to afford  treatment regimen . achieve adherence to monitoring guidelines and medication adherence to achieve therapeutic efficacy . achieve control of diabetes as evidenced by improvement in A1c through collaboration with PharmD and provider.   Interventions: . 1:1 collaboration with Etta Grandchild, MD regarding development and update of comprehensive plan of care as evidenced by provider attestation and co-signature . Inter-disciplinary care team collaboration (see longitudinal plan of care) . Comprehensive medication review performed; medication list updated in electronic medical record  Hypertension (BP goal <130/80) -controlled -Current treatment: o metoprolol succinate 100 mg twice daily  o telmisartan 40 mg daily,  o spironolactone 25 mg daily,  o furosemide 20 mg daily,  o potassium chloride 20 meq twice daily o Nitroglycerin 0.4 mg as needed -Current home readings: "good" -Denies hypotensive/hypertensive symptoms -Educated on BP goals and benefits of medications for prevention of heart attack, stroke and kidney damage; Daily salt intake goal < 2300 mg; Exercise goal of 150 minutes per week; Importance of home blood pressure monitoring; -Counseled to monitor BP at home twice a week, document, and provide log at future appointments -Counseled on diet and exercise extensively Recommended to continue current medication  Hyperlipidemia: (LDL goal < 70) -controlled -Current treatment: . Atorvastatin 40 mg daily -Educated on Cholesterol goals;  Benefits of statin for ASCVD risk reduction; Importance of limiting foods high in cholesterol; -Recommended to continue current medication  Diabetes (A1c goal <8%) -uncontrolled - Ozempic was recently increased from 1 to 2 mg, however the 2 mg  pen is not yet available and pt is not willing to inject 1 mg 2x for 1 dose. -Current medications: o Jardiance 25 mg daily (via [employer]) o metformin ER 1000 mg twice daily o Toujeo Max 50 units daily o Ozempic 1 mg weekly (via [employer]) -Current home glucose readings . fasting glucose: 140-160 -Denies hypoglycemic/hyperglycemic symptoms -Educated onA1c and blood sugar goals; Complications of diabetes including kidney damage, retinal damage, and cardiovascular disease; Exercise goal of 150 minutes per week; Benefits of weight loss; Prevention and management of hypoglycemic episodes; Benefits of routine self-monitoring of blood sugar; -Counseled to check feet daily and get yearly eye exams -Counseled on diet and exercise extensively Recommended to continue current medication. Once 2 mg Ozempic pen is available, will increase dose  Atrial Fibrillation (Goal: prevent stroke and major bleeding) -controlled  -CHADSVASC: 8 -Current treatment: . Rate control: Metoprolol succinate 100 mg twice a day . Anticoagulation: Warfarin 3 mg as directed -Counseled on increased risk of stroke due to Afib and benefits of anticoagulation for stroke prevention; importance of adherence to anticoagulant exactly as prescribed; bleeding risk associated with warfarin and importance of self-monitoring for signs/symptoms of bleeding; avoidance of NSAIDs due to increased bleeding risk with anticoagulants; importance of regular laboratory monitoring; -Recommended to continue current medication  Heart Failure (Goal: control symptoms and prevent exacerbations) controlled Type: Combined Systolic and Diastolic -NYHA Class: II (slight limitation of activity) -Ejection fraction: 50-55% (Date: 09/02/2014) -Current treatment: see hypertension treatment -Educated on Benefits of medications for managing symptoms and prolonging life Importance of weighing daily; if you gain more than 3 pounds in one day or 5 pounds in  one week, contact cardiologist or PCP Proper diuretic administration and potassium supplementation Importance of blood pressure control -Recommended to continue current medication  Patient Goals/Self-Care Activities . Over the next 90 days, patient will:  - take medications as prescribed focus on medication adherence by pill box check glucose daily, document, and provide at future appointments check blood pressure  daily, document, and provide at future appointments  Follow Up Plan: Telephone follow up appointment with care management team member scheduled for: 3 months     Medication Assistance: Application for Ozempic, Jardiance  medication assistance program. in process.  Anticipated assistance start date 04/21/20.  See plan of care for additional detail.  Patient's preferred pharmacy is:  [facility] - [place], [place] - [address] [place] [address] Phone: [phone] Fax: [phone]  Uses pill box? Yes Pt endorses 100% compliance  We discussed: Reviewed patient's [facility] medication and Epic medication profile assuring there are no discrepancies or gaps in therapy. Confirmed all fill dates appropriate and verified with patient that there is a sufficient quantity of all prescribed medications at home. Informed patient to call me any time if needing medications before scheduled deliveries.   Plan: Utilize [facility] pharmacy for medication synchronization, packaging and delivery    Follow Up:  Patient agrees to Care Plan and Follow-up.  Plan: Telephone follow up appointment with care management team member scheduled for:  3 months  Charlene Brooke, PharmD, [facility] Clinical Pharmacist [facility] [phone]

## 2020-04-07 NOTE — Patient Instructions (Signed)
Visit Information  Phone number for Pharmacist: [phone]  Goals Addressed   None      Patient Care Plan: CCM Pharmacy Care Plan    Problem Identified: DM, HTN, HLD, CAD, CHF   Priority: High    Goal: Patient-Specific Goal   Start Date: 04/07/2020  Expected End Date: 07/05/2020  This Visit's Progress: On track  Priority: High  Note:   Current Barriers:  . Unable to independently afford treatment regimen . Unable to independently monitor therapeutic efficacy . Unable to achieve control of Diabetes   Pharmacist Clinical Goal(s):  Marland Kitchen Over the next 90 days, patient will verbalize ability to afford treatment regimen . achieve adherence to monitoring guidelines and medication adherence to achieve therapeutic efficacy . achieve control of diabetes as evidenced by improvement in A1c through collaboration with PharmD and provider.   Interventions: . 1:1 collaboration with [name], MD regarding development and update of comprehensive plan of care as evidenced by provider attestation and co-signature . Inter-disciplinary care team collaboration (see longitudinal plan of care) . Comprehensive medication review performed; medication list updated in electronic medical record  Hypertension (BP goal <130/80) -controlled -Current treatment: o metoprolol succinate 100 mg twice daily  o telmisartan 40 mg daily,  o spironolactone 25 mg daily,  o furosemide 20 mg daily,  o potassium chloride 20 meq twice daily o Nitroglycerin 0.4 mg as needed -Current home readings: "good" -Denies hypotensive/hypertensive symptoms -Educated on BP goals and benefits of medications for prevention of heart attack, stroke and kidney damage; Daily salt intake goal < 2300 mg; Exercise goal of 150 minutes per week; Importance of home blood pressure monitoring; -Counseled to monitor BP at home twice a week, document, and provide log at future appointments -Counseled on diet and exercise  extensively Recommended to continue current medication  Hyperlipidemia: (LDL goal < 70) -controlled -Current treatment: . Atorvastatin 40 mg daily -Educated on Cholesterol goals;  Benefits of statin for ASCVD risk reduction; Importance of limiting foods high in cholesterol; -Recommended to continue current medication  Diabetes (A1c goal <8%) -uncontrolled - Ozempic was recently increased from 1 to 2 mg, however the 2 mg pen is not yet available and pt is not willing to inject 1 mg 2x for 1 dose. -Current medications: o Jardiance 25 mg daily (via [employer]) o metformin ER 1000 mg twice daily o Toujeo Max 50 units daily o Ozempic 1 mg weekly (via [employer]) -Current home glucose readings . fasting glucose: 140-160 -Denies hypoglycemic/hyperglycemic symptoms -Educated onA1c and blood sugar goals; Complications of diabetes including kidney damage, retinal damage, and cardiovascular disease; Exercise goal of 150 minutes per week; Benefits of weight loss; Prevention and management of hypoglycemic episodes; Benefits of routine self-monitoring of blood sugar; -Counseled to check feet daily and get yearly eye exams -Counseled on diet and exercise extensively Recommended to continue current medication. Once 2 mg Ozempic pen is available, will increase dose  Atrial Fibrillation (Goal: prevent stroke and major bleeding) -controlled  -CHADSVASC: 8 -Current treatment: . Rate control: Metoprolol succinate 100 mg twice a day . Anticoagulation: Warfarin 3 mg as directed -Counseled on increased risk of stroke due to Afib and benefits of anticoagulation for stroke prevention; importance of adherence to anticoagulant exactly as prescribed; bleeding risk associated with warfarin and importance of self-monitoring for signs/symptoms of bleeding; avoidance of NSAIDs due to increased bleeding risk with anticoagulants; importance of regular laboratory monitoring; -Recommended to continue current  medication  Heart Failure (Goal: control symptoms and prevent exacerbations)  controlled Type: Combined Systolic and Diastolic -NYHA Class: II (slight limitation of activity) -Ejection fraction: 50-55% (Date: 09/02/2014) -Current treatment: see hypertension treatment -Educated on Benefits of medications for managing symptoms and prolonging life Importance of weighing daily; if you gain more than 3 pounds in one day or 5 pounds in one week, contact cardiologist or PCP Proper diuretic administration and potassium supplementation Importance of blood pressure control -Recommended to continue current medication  Patient Goals/Self-Care Activities . Over the next 90 days, patient will:  - take medications as prescribed focus on medication adherence by pill box check glucose daily, document, and provide at future appointments check blood pressure daily, document, and provide at future appointments  Follow Up Plan: Telephone follow up appointment with care management team member scheduled for: 3 months     The patient verbalized understanding of instructions, educational materials, and care plan provided today and declined offer to receive copy of patient instructions, educational materials, and care plan.  Telephone follow up appointment with pharmacy team member scheduled for: 3 months  Charlene Brooke, PharmD, [facility] Clinical Pharmacist [facility] at [facility] [phone]

## 2020-04-08 ENCOUNTER — Other Ambulatory Visit: Payer: Self-pay | Admitting: *Deleted

## 2020-04-08 ENCOUNTER — Encounter: Payer: Self-pay | Admitting: Internal Medicine

## 2020-04-09 ENCOUNTER — Ambulatory Visit (INDEPENDENT_AMBULATORY_CARE_PROVIDER_SITE_OTHER): Payer: Medicare Other | Admitting: *Deleted

## 2020-04-09 DIAGNOSIS — M150 Primary generalized (osteo)arthritis: Secondary | ICD-10-CM | POA: Diagnosis not present

## 2020-04-09 DIAGNOSIS — G894 Chronic pain syndrome: Secondary | ICD-10-CM | POA: Diagnosis not present

## 2020-04-09 DIAGNOSIS — Z79891 Long term (current) use of opiate analgesic: Secondary | ICD-10-CM | POA: Diagnosis not present

## 2020-04-09 DIAGNOSIS — M65312 Trigger thumb, left thumb: Secondary | ICD-10-CM | POA: Diagnosis not present

## 2020-04-09 DIAGNOSIS — G8929 Other chronic pain: Secondary | ICD-10-CM

## 2020-04-09 DIAGNOSIS — F32A Depression, unspecified: Secondary | ICD-10-CM

## 2020-04-09 DIAGNOSIS — F09 Unspecified mental disorder due to known physiological condition: Secondary | ICD-10-CM

## 2020-04-09 NOTE — Patient Instructions (Addendum)
Visit Information  Nice to talk to you and glad you had an initial session with Ms Evangeline Gula.  PATIENT GOALS: Goals Addressed            This Visit's Progress   . Work on managing my worry and fear       Timeframe:  Long-Range Goal Priority:  High Start Date:    04/09/2020                         Expected End Date:  07/07/2020              Follow Up Date 04/10/2020   - continue personal counseling with Ms. Darren/ Behavioral Health Therapist - practice relaxation or meditation daily - talk about feelings with a friend, family or spiritual advisor - practice positive thinking and self-talk   Why is this important?    When you are stressed, down or upset, your body reacts too.   For example, your blood pressure may get higher; you may have a headache or stomachache.   When your emotions get the best of you, your body's ability to fight off cold and flu gets weak.   These steps will help you manage your emotions.       The patient verbalized understanding of instructions, educational materials, and care plan provided today and declined offer to receive copy of patient instructions, educational materials, and care plan.  Telephone follow up appointment with care management team member scheduled for: 04/10/2020  Ludwig Clarks, LCSW

## 2020-04-09 NOTE — Chronic Care Management (AMB) (Signed)
Chronic Care Management    Clinical Social Work Note  04/09/2020 Name: Kristen [name] MRN: [id number] DOB: 06-28-1945  Kristen [name] is a 75 y.o. year old female who is a primary care patient of Kristen Lima, MD. The CCM team was consulted to assist the patient with chronic disease management and/or care coordination needs related to: Mental Health Counseling and Resources.  Engaged with patient by telephone for initial visit in response to provider referral for social work chronic care management and care coordination services.  Consent to Services:  The patient was given information about Chronic Care Management services, agreed to services, and gave verbal consent prior to initiation of services.  Please see initial visit note for detailed documentation.  Patient agreed to services and consent obtained.  Assessment: Review of patient past medical history, allergies, medications, and health status, including review of relevant consultants reports was performed today as part of a comprehensive evaluation and provision of chronic care management and care coordination services.    SDOH (Social Determinants of Health) assessments and interventions performed:  SDOH Interventions   Flowsheet Row Most Recent Value  SDOH Interventions   Depression Interventions/Treatment  Counseling, Referral to Psychiatry       Advanced Directives Status: Not addressed in this encounter. CCM Care Plan  Allergies  Allergen Reactions  . Metformin And Related Other (See Comments)    Must take XR form only, cannot tolerate Regular release metformin  . Cleocin [Clindamycin Hcl] Diarrhea  . Codeine Itching  . Doxycycline Hyclate Diarrhea  . Macrolides And Ketolides Diarrhea  . Morphine Hives  . Pentazocine Lactate Itching and Nausea And Vomiting    (GENERIC- [place])  . Vibramycin [Doxycycline Calcium] Diarrhea and Rash  . Clindamycin Diarrhea  . Definity [Perflutren Lipid Microsphere] Other  (See Comments)    Patient complained of warm sensation in chest x few seconds duration when injected Definity.No other symptoms  . Doxycycline Diarrhea  . Metformin Other (See Comments)  . Pentazocine Nausea Only  . Perflutren Other (See Comments)  . Perflutren Lipid Microspheres Other (See Comments)  . Sulfa Antibiotics Diarrhea and Rash  . Sulfonamide Derivatives Rash    Outpatient Encounter Medications as of 04/09/2020  Medication Sig  . ALPRAZolam (XANAX) 1 MG tablet Take 1 tablet (1 mg total) by mouth 3 (three) times daily as needed for anxiety.  . Ascorbic Acid (VITAMIN C) 1000 MG tablet Take 1,000 mg by mouth daily.  Marland Kitchen atorvastatin (LIPITOR) 40 MG tablet TAKE ONE TABLET BY MOUTH DAILY  . Blood Glucose Monitoring Suppl (ONE TOUCH ULTRA 2) w/Device KIT Use as advised  . Cholecalciferol (VITAMIN D3) 50 MCG (2000 UT) capsule Take 2,000 Units by mouth daily.  . cyclobenzaprine (FLEXERIL) 10 MG tablet Take 10 mg by mouth 3 (three) times daily as needed for muscle spasms.  . empagliflozin (JARDIANCE) 25 MG TABS tablet Take 25 mg by mouth daily.  . furosemide (LASIX) 20 MG tablet Take 1 tablet (20 mg total) by mouth every morning.  . gabapentin (NEURONTIN) 300 MG capsule TAKE ONE CAPSULE BY MOUTH THREE TIMES DAILY  . glucose blood (COOL BLOOD GLUCOSE TEST STRIPS) test strip Use to test blood sugar twice daily. DX: E11.9  . insulin glargine, 2 Unit Dial, (TOUJEO MAX SOLOSTAR) 300 UNIT/ML Solostar Pen Inject 50 Units into the skin daily.  . Insulin Pen Needle (NOVOFINE) 32G X 6 MM MISC 1 Act by Does not apply route daily.  . Lancets (ONETOUCH ULTRASOFT) lancets Use as instructed  .  magnesium oxide (MAG-OX) 400 MG tablet TAKE ONE TABLET BY MOUTH BY MOUTH TWICE DAILY WITH BREAKFAST AND EVENING MEAL]  . metFORMIN (GLUCOPHAGE-XR) 500 MG 24 hr tablet TAKE TWO TABLETS BY MOUTH TWICE DAILY WITH FOOD  . metoprolol succinate (TOPROL-XL) 100 MG 24 hr tablet Take 1 tablet (100 mg total) by mouth 2 (two)  times daily. Take with or immediately following a meal.  . Multiple Vitamin (MULTIVITAMIN) tablet Take 1 tablet by mouth daily.  . nitroGLYCERIN (NITROSTAT) 0.4 MG SL tablet Place 1 tablet (0.4 mg total) under the tongue every 5 (five) minutes as needed for chest pain (x 3 doses). Reported on 05/08/2015  . ondansetron (ZOFRAN-ODT) 4 MG disintegrating tablet Take 1 tablet (4 mg total) by mouth every 8 (eight) hours as needed for nausea or vomiting.  Marland Kitchen PERCOCET 10-325 MG tablet Take 1 tablet by mouth 5 (five) times daily as needed.  . potassium chloride SA (KLOR-CON) 20 MEQ tablet Take 1 tablet (20 mEq total) by mouth 2 (two) times daily.  . Semaglutide, 1 MG/DOSE, (OZEMPIC, 1 MG/DOSE,) 2 MG/1.5ML SOPN Inject 2 mg into the skin once a week. (Patient taking differently: Inject 1 mg into the skin once a week.)  . spironolactone (ALDACTONE) 25 MG tablet Take 1 tablet (25 mg total) by mouth 2 (two) times daily. (Patient taking differently: Take 25 mg by mouth daily.)  . telmisartan (MICARDIS) 40 MG tablet TAKE ONE TABLET BY MOUTH DAILY  . thiamine 100 MG tablet Take 1 tablet (100 mg total) by mouth daily.  Marland Kitchen triamcinolone cream (KENALOG) 0.5 % Apply 1 application topically 3 (three) times daily.  Marland Kitchen warfarin (COUMADIN) 3 MG tablet TAKE ONE TABLET BY MOUTH ONCE DAILY OR USE AS DIRECTED by clinic   No facility-administered encounter medications on file as of 04/09/2020.    Patient Active Problem List   Diagnosis Date Noted  . Poor dentition requiring referral to dentistry 03/26/2020  . Thiamine deficiency 12/30/2018  . Vitamin B12 deficiency anemia due to intrinsic factor deficiency 12/20/2018  . CHF (congestive heart failure), NYHA class I, chronic, combined ([place]) 12/19/2018  . Coronary artery disease involving native coronary artery of native heart with angina pectoris ([place]) 12/19/2018  . Obesity with body mass index (BMI) of 30.0 to 39.9 01/15/2018  . Chronic idiopathic constipation 12/27/2017  .  Atrial fibrillation ([place]) 10/30/2017  . Type 2 diabetes mellitus ([place]) 10/30/2017  . Eczema 08/07/2017  . Insomnia secondary to chronic pain 08/04/2016  . Visit for screening mammogram 04/14/2016  . LV dysfunction   . Routine general medical examination at a health care facility 05/03/2013  . DM (diabetes mellitus), type 2 with peripheral vascular complications ([place]) 62/95/2841  . PVC's/nonsustained VT 10/24/2012  . H/O: CVA (cerebrovascular accident) 02/09/2012  . Hypothyroidism 05/27/2011  . Fibromyalgia 10/08/2010  . Long term current use of anticoagulant 05/05/2010  . Low back pain, non-specific 05/12/2008  . Cognitive dysfunction associated with depression 01/29/2007  . Osteoarthritis 11/22/2006  . Hyperlipidemia associated with type 2 diabetes mellitus ([place]) 09/13/2006  . Coronary atherosclerosis 09/13/2006  . Paroxysmal atrial fibrillation ([place]) 09/13/2006  . GERD 09/13/2006  . Hypertension associated with diabetes ([place]) 08/30/2006    Conditions to be addressed/monitored: Anxiety and Depression; Mental Health Concerns   Care Plan : LCSW Plan of Care  Updates made by Deirdre Peer, LCSW since 04/09/2020 12:00 AM    Problem: Lacks strategy for management of anxiety around sleeping and fear of dying.   Priority: High  Long-Range Goal: Establish and Maintain plan for anxiety management   Start Date: 04/09/2020  Expected End Date: 07/08/2020  This Visit's Progress: On track  Priority: High  Note:   Current Barriers:  . Chronic Mental Health needs related to anxiety/PTSD - has participated in an initial visit with [facility] clinician and requesting follow up.  . Limited social support, Mental Health Concerns , and Family and relationship dysfunction . Suicidal Ideation/Homicidal Ideation: No Clinical Social Work Goal(s):  Marland Kitchen Over the next 90 days, patient will work with SW monthly by telephone to reduce or manage symptoms related to anxiety Interventions: . Patient interviewed  and appropriate assessments performed: PHQ 2 and PHQ 9 . Discussed plans with patient for ongoing care management follow up and provided patient with direct contact information for care management team . Referred patient to [facility]  (mental health provider) for long term follow up and therapy/counseling . Solution-Focused Strategies, Mindfulness or Relaxation Training, Emotional/Supportive Counseling, Problem Solving, and Referred to counselor/psychotherapist Patient Coping Strengths:  . Hopefulness Patient Self Care Deficits:  . Lacks social connections Patient Goals:   - continue personal counseling with Ms. Darren - Psychologist, forensic or meditation daily - talk about feelings with a friend, family or spiritual advisor - practice positive thinking and self-talk   Follow Up Plan: Appointment scheduled for SW follow up with client by phone on:  04/10/2020      Follow Up Plan: Appointment scheduled for SW follow up with client by phone on: 04/10/2020      Ludwig Clarks, LCSW

## 2020-04-14 ENCOUNTER — Other Ambulatory Visit: Payer: Self-pay | Admitting: General Practice

## 2020-04-14 ENCOUNTER — Other Ambulatory Visit: Payer: Self-pay

## 2020-04-14 ENCOUNTER — Other Ambulatory Visit: Payer: Self-pay | Admitting: Internal Medicine

## 2020-04-14 ENCOUNTER — Telehealth: Payer: Self-pay | Admitting: Pharmacist

## 2020-04-14 ENCOUNTER — Encounter: Payer: Self-pay | Admitting: Internal Medicine

## 2020-04-14 ENCOUNTER — Ambulatory Visit (INDEPENDENT_AMBULATORY_CARE_PROVIDER_SITE_OTHER): Payer: Medicare Other | Admitting: General Practice

## 2020-04-14 ENCOUNTER — Ambulatory Visit: Payer: Self-pay | Admitting: *Deleted

## 2020-04-14 DIAGNOSIS — Z7901 Long term (current) use of anticoagulants: Secondary | ICD-10-CM | POA: Diagnosis not present

## 2020-04-14 DIAGNOSIS — I480 Paroxysmal atrial fibrillation: Secondary | ICD-10-CM | POA: Diagnosis not present

## 2020-04-14 DIAGNOSIS — I2510 Atherosclerotic heart disease of native coronary artery without angina pectoris: Secondary | ICD-10-CM

## 2020-04-14 DIAGNOSIS — E1151 Type 2 diabetes mellitus with diabetic peripheral angiopathy without gangrene: Secondary | ICD-10-CM

## 2020-04-14 DIAGNOSIS — E538 Deficiency of other specified B group vitamins: Secondary | ICD-10-CM

## 2020-04-14 LAB — POCT INR: INR: 2.8 (ref 2.0–3.0)

## 2020-04-14 MED ORDER — CYANOCOBALAMIN 1000 MCG/ML IJ SOLN
1000.0000 ug | Freq: Once | INTRAMUSCULAR | Status: AC
Start: 2020-04-14 — End: 2020-04-14
  Administered 2020-04-14: 1000 ug via INTRAMUSCULAR

## 2020-04-14 MED ORDER — EMPAGLIFLOZIN 25 MG PO TABS
25.0000 mg | ORAL_TABLET | Freq: Every day | ORAL | 1 refills | Status: DC
Start: 2020-04-14 — End: 2020-08-04

## 2020-04-14 NOTE — Chronic Care Management (AMB) (Signed)
Chronic Care Management    Clinical Social Work Note  04/14/2020 Name: Kristen [name] MRN: [id number] DOB: [birth year]  Kristen [name] [name] is a 75 y.o. year old female who is a primary care patient of [name], MD. The CCM team was consulted to assist the patient with chronic disease management and/or care coordination needs related to: Mental Health Counseling and Resources and Grief Counseling.   Engaged with patient by telephone for follow up visit in response to provider referral for social work chronic care management and care coordination services.   Consent to Services:  The patient was given information about Chronic Care Management services, agreed to services, and gave verbal consent prior to initiation of services.  Please see initial visit note for detailed documentation.   Patient agreed to services and consent obtained.   Assessment: Review of patient past medical history, allergies, medications, and health status, including review of relevant consultants reports was performed today as part of a comprehensive evaluation and provision of chronic care management and care coordination services.     SDOH (Social Determinants of Health) assessments and interventions performed:     Advanced Directives Status: Not addressed in this encounter.  CCM Care Plan  Allergies  Allergen Reactions   Metformin And Related Other (See Comments)    Must take XR form only, cannot tolerate Regular release metformin   Cleocin [Clindamycin Hcl] Diarrhea   Codeine Itching   Doxycycline Hyclate Diarrhea   Macrolides And Ketolides Diarrhea   Morphine Hives   Pentazocine Lactate Itching and Nausea And Vomiting    (GENERIC- Talwin)   Vibramycin [Doxycycline Calcium] Diarrhea and Rash   Clindamycin Diarrhea   Definity [Perflutren Lipid Microsphere] Other (See Comments)    Patient complained of warm sensation in chest x few seconds duration when injected Definity.No other symptoms    Doxycycline Diarrhea   Metformin Other (See Comments)   Pentazocine Nausea Only   Perflutren Other (See Comments)   Perflutren Lipid Microspheres Other (See Comments)   Sulfa Antibiotics Diarrhea and Rash   Sulfonamide Derivatives Rash    Outpatient Encounter Medications as of 04/14/2020  Medication Sig   ALPRAZolam (XANAX) 1 MG tablet Take 1 tablet (1 mg total) by mouth 3 (three) times daily as needed for anxiety.   Ascorbic Acid (VITAMIN C) 1000 MG tablet Take 1,000 mg by mouth daily.   atorvastatin (LIPITOR) 40 MG tablet TAKE ONE TABLET BY MOUTH DAILY   Blood Glucose Monitoring Suppl (ONE TOUCH ULTRA 2) w/Device KIT Use as advised   Cholecalciferol (VITAMIN D3) 50 MCG (2000 UT) capsule Take 2,000 Units by mouth daily.   cyclobenzaprine (FLEXERIL) 10 MG tablet Take 10 mg by mouth 3 (three) times daily as needed for muscle spasms.   empagliflozin (JARDIANCE) 25 MG TABS tablet Take 1 tablet (25 mg total) by mouth daily.   furosemide (LASIX) 20 MG tablet Take 1 tablet (20 mg total) by mouth every morning.   gabapentin (NEURONTIN) 300 MG capsule TAKE ONE CAPSULE BY MOUTH THREE TIMES DAILY   glucose blood (COOL BLOOD GLUCOSE TEST STRIPS) test strip Use to test blood sugar twice daily. DX: E11.9   insulin glargine, 2 Unit Dial, (TOUJEO MAX SOLOSTAR) 300 UNIT/ML Solostar Pen Inject 50 Units into the skin daily.   Insulin Pen Needle (NOVOFINE) 32G X 6 MM MISC 1 Act by Does not apply route daily.   Lancets (ONETOUCH ULTRASOFT) lancets Use as instructed   magnesium oxide (MAG-OX) 400 MG tablet TAKE ONE TABLET  BY MOUTH BY MOUTH TWICE DAILY WITH BREAKFAST AND EVENING MEAL]   metFORMIN (GLUCOPHAGE-XR) 500 MG 24 hr tablet TAKE TWO TABLETS BY MOUTH TWICE DAILY WITH FOOD   metoprolol succinate (TOPROL-XL) 100 MG 24 hr tablet Take 1 tablet (100 mg total) by mouth 2 (two) times daily. Take with or immediately following a meal.   Multiple Vitamin (MULTIVITAMIN) tablet Take 1 tablet by mouth daily.    nitroGLYCERIN (NITROSTAT) 0.4 MG SL tablet Place 1 tablet (0.4 mg total) under the tongue every 5 (five) minutes as needed for chest pain (x 3 doses). Reported on 05/08/2015   ondansetron (ZOFRAN-ODT) 4 MG disintegrating tablet Take 1 tablet (4 mg total) by mouth every 8 (eight) hours as needed for nausea or vomiting.   PERCOCET 10-325 MG tablet Take 1 tablet by mouth 5 (five) times daily as needed.   potassium chloride SA (KLOR-CON) 20 MEQ tablet Take 1 tablet (20 mEq total) by mouth 2 (two) times daily.   Semaglutide, 1 MG/DOSE, (OZEMPIC, 1 MG/DOSE,) 2 MG/1.5ML SOPN Inject 2 mg into the skin once a week. (Patient taking differently: Inject 1 mg into the skin once a week.)   spironolactone (ALDACTONE) 25 MG tablet Take 1 tablet (25 mg total) by mouth 2 (two) times daily. (Patient taking differently: Take 25 mg by mouth daily.)   telmisartan (MICARDIS) 40 MG tablet TAKE ONE TABLET BY MOUTH DAILY   thiamine 100 MG tablet Take 1 tablet (100 mg total) by mouth daily.   triamcinolone cream (KENALOG) 0.5 % Apply 1 application topically 3 (three) times daily.   warfarin (COUMADIN) 3 MG tablet TAKE ONE TABLET BY MOUTH ONCE DAILY OR USE AS DIRECTED by clinic   No facility-administered encounter medications on file as of 04/14/2020.    Patient Active Problem List   Diagnosis Date Noted   Poor dentition requiring referral to dentistry 03/26/2020   Thiamine deficiency 12/30/2018   Vitamin B12 deficiency anemia due to intrinsic factor deficiency 12/20/2018   CHF (congestive heart failure), NYHA class I, chronic, combined (HCC) 12/19/2018   Coronary artery disease involving native coronary artery of native heart with angina pectoris (HCC) 12/19/2018   Obesity with body mass index (BMI) of 30.0 to 39.9 01/15/2018   Chronic idiopathic constipation 12/27/2017   Atrial fibrillation (HCC) 10/30/2017   Type 2 diabetes mellitus (HCC) 10/30/2017   Eczema 08/07/2017   Insomnia secondary to chronic pain 08/04/2016    Visit for screening mammogram 04/14/2016   LV dysfunction    Routine general medical examination at a health care facility 05/03/2013   DM (diabetes mellitus), type 2 with peripheral vascular complications (HCC) 12/01/2012   PVC's/nonsustained VT 10/24/2012   H/O: CVA (cerebrovascular accident) 02/09/2012   Hypothyroidism 05/27/2011   Fibromyalgia 10/08/2010   Long term current use of anticoagulant 05/05/2010   Low back pain, non-specific 05/12/2008   Cognitive dysfunction associated with depression 01/29/2007   Osteoarthritis 11/22/2006   Hyperlipidemia associated with type 2 diabetes mellitus (HCC) 09/13/2006   Coronary atherosclerosis 09/13/2006   Paroxysmal atrial fibrillation (HCC) 09/13/2006   GERD 09/13/2006   Hypertension associated with diabetes (HCC) 08/30/2006    Conditions to be addressed/monitored: Anxiety and Depression; Mental Health Concerns   Care Plan : LCSW Plan of Care  Updates made by Buck Mam, LCSW since 04/14/2020 12:00 AM     Problem: Lacks strategy for management of anxiety around sleeping and fear of dying.   Priority: High     Long-Range Goal: Establish and Maintain plan  for anxiety management   Start Date: 04/09/2020  Expected End Date: 07/08/2020  Recent Progress: On track  Priority: High  Note:   Current Barriers:  Chronic Mental Health needs related to anxiety/PTSD - has participated in an initial visit with [facility] clinician and requesting follow up.  Limited social support, Mental Health Concerns , and Family and relationship dysfunction Suicidal Ideation/Homicidal Ideation: No Clinical Social Work Goal(s):  Over the next 90 days, patient will work with SW monthly by telephone to reduce or manage symptoms related to anxiety Interventions: Discussed patient's desire to not pursue scheduling more appointments with recent therapist ,however need for further assessment and and treatment of symptoms Discussed plans with patient for ongoing care  management follow up and provided patient with direct contact information for care management team Solution-Focused Strategies, Mindfulness or Relaxation Training, Emotional/Supportive Counseling, Problem Solving, and Referred to counselor/psychotherapist Patient Coping Strengths:  Hopefulness Patient Self Care Deficits:  Lacks social connections Patient Goals:   - continue personal counseling with Ms. Darren - Psychologist, forensic or meditation daily - talk about feelings with a friend, family or spiritual advisor - practice positive thinking and self-talk   Follow Up Plan: Appointment scheduled for SW follow up with client by phone on:  04/20/2020 at 2pm.      Follow Up Plan: SW will follow up with patient by phone over the next 04/20/2020 at 2pm and Appointment scheduled for SW follow up with client by phone on:       Ludwig Clarks, LCSW

## 2020-04-14 NOTE — Patient Instructions (Addendum)
Pre visit review using our clinic review tool, if applicable. No additional management support is needed unless otherwise documented below in the visit note.  Continue to take 1 tablet daily except 2 tablets on Mon and Fridays. Re-check in 6 weeks.

## 2020-04-14 NOTE — Progress Notes (Signed)
Chronic Care Management Pharmacy Assistant   Name: Raiana Pharris  MRN: [id number] DOB: 12/18/1945  Reason for Encounter: Chart Review   PCP : [name], MD  Allergies:   Allergies  Allergen Reactions   Metformin And Related Other (See Comments)    Must take XR form only, cannot tolerate Regular release metformin   Cleocin [Clindamycin Hcl] Diarrhea   Codeine Itching   Doxycycline Hyclate Diarrhea   Macrolides And Ketolides Diarrhea   Morphine Hives   Pentazocine Lactate Itching and Nausea And Vomiting    (GENERIC- [place]win)   Vibramycin [Doxycycline Calcium] Diarrhea and Rash   Clindamycin Diarrhea   Definity [Perflutren Lipid Microsphere] Other (See Comments)    Patient complained of warm sensation in chest x few seconds duration when injected Definity.No other symptoms   Doxycycline Diarrhea   Metformin Other (See Comments)   Pentazocine Nausea Only   Perflutren Other (See Comments)   Perflutren Lipid Microspheres Other (See Comments)   Sulfa Antibiotics Diarrhea and Rash   Sulfonamide Derivatives Rash    Medications: Outpatient Encounter Medications as of 04/14/2020  Medication Sig   ALPRAZolam (XANAX) 1 MG tablet Take 1 tablet (1 mg total) by mouth 3 (three) times daily as needed for anxiety.   Ascorbic Acid (VITAMIN C) 1000 MG tablet Take 1,000 mg by mouth daily.   atorvastatin (LIPITOR) 40 MG tablet TAKE ONE TABLET BY MOUTH DAILY   Blood Glucose Monitoring Suppl (ONE TOUCH ULTRA 2) w/Device KIT Use as advised   Cholecalciferol (VITAMIN D3) 50 MCG (2000 UT) capsule Take 2,000 Units by mouth daily.   cyclobenzaprine (FLEXERIL) 10 MG tablet Take 10 mg by mouth 3 (three) times daily as needed for muscle spasms.   empagliflozin (JARDIANCE) 25 MG TABS tablet Take 1 tablet (25 mg total) by mouth daily.   furosemide (LASIX) 20 MG tablet Take 1 tablet (20 mg total) by mouth every morning.   gabapentin (NEURONTIN) 300 MG capsule TAKE  ONE CAPSULE BY MOUTH THREE TIMES DAILY   glucose blood (COOL BLOOD GLUCOSE TEST STRIPS) test strip Use to test blood sugar twice daily. DX: E11.9   insulin glargine, 2 Unit Dial, (TOUJEO MAX SOLOSTAR) 300 UNIT/ML Solostar Pen Inject 50 Units into the skin daily.   Insulin Pen Needle (NOVOFINE) 32G X 6 MM MISC 1 Act by Does not apply route daily.   Lancets (ONETOUCH ULTRASOFT) lancets Use as instructed   magnesium oxide (MAG-OX) 400 MG tablet TAKE ONE TABLET BY MOUTH BY MOUTH TWICE DAILY WITH BREAKFAST AND EVENING MEAL]   metFORMIN (GLUCOPHAGE-XR) 500 MG 24 hr tablet TAKE TWO TABLETS BY MOUTH TWICE DAILY WITH FOOD   metoprolol succinate (TOPROL-XL) 100 MG 24 hr tablet Take 1 tablet (100 mg total) by mouth 2 (two) times daily. Take with or immediately following a meal.   Multiple Vitamin (MULTIVITAMIN) tablet Take 1 tablet by mouth daily.   nitroGLYCERIN (NITROSTAT) 0.4 MG SL tablet Place 1 tablet (0.4 mg total) under the tongue every 5 (five) minutes as needed for chest pain (x 3 doses). Reported on 05/08/2015   ondansetron (ZOFRAN-ODT) 4 MG disintegrating tablet Take 1 tablet (4 mg total) by mouth every 8 (eight) hours as needed for nausea or vomiting.   PERCOCET 10-325 MG tablet Take 1 tablet by mouth 5 (five) times daily as needed.   potassium chloride SA (KLOR-CON) 20 MEQ tablet Take 1 tablet (20 mEq total) by mouth 2 (two) times daily.   Semaglutide, 1 MG/DOSE, (OZEMPIC, 1  MG/DOSE,) 2 MG/1.5ML SOPN Inject 2 mg into the skin once a week. (Patient taking differently: Inject 1 mg into the skin once a week.)   spironolactone (ALDACTONE) 25 MG tablet Take 1 tablet (25 mg total) by mouth 2 (two) times daily. (Patient taking differently: Take 25 mg by mouth daily.)   telmisartan (MICARDIS) 40 MG tablet TAKE ONE TABLET BY MOUTH DAILY   thiamine 100 MG tablet Take 1 tablet (100 mg total) by mouth daily.   triamcinolone cream (KENALOG) 0.5 % Apply 1 application topically 3 (three) times  daily.   warfarin (COUMADIN) 3 MG tablet TAKE ONE TABLET BY MOUTH ONCE DAILY OR USE AS DIRECTED by clinic   No facility-administered encounter medications on file as of 04/14/2020.    Current Diagnosis: Patient Active Problem List   Diagnosis Date Noted   Poor dentition requiring referral to dentistry 03/26/2020   Thiamine deficiency 12/30/2018   Vitamin B12 deficiency anemia due to intrinsic factor deficiency 12/20/2018   CHF (congestive heart failure), NYHA class I, chronic, combined ([place]) 12/19/2018   Coronary artery disease involving native coronary artery of native heart with angina pectoris ([place]) 12/19/2018   Obesity with body mass index (BMI) of 30.0 to 39.9 01/15/2018   Chronic idiopathic constipation 12/27/2017   Atrial fibrillation ([place]) 10/30/2017   Type 2 diabetes mellitus ([place]) 10/30/2017   Eczema 08/07/2017   Insomnia secondary to chronic pain 08/04/2016   Visit for screening mammogram 04/14/2016   LV dysfunction    Routine general medical examination at a health care facility 05/03/2013   DM (diabetes mellitus), type 2 with peripheral vascular complications ([place]) 63/78/5885   PVC's/nonsustained VT 10/24/2012   H/O: CVA (cerebrovascular accident) 02/09/2012   Hypothyroidism 05/27/2011   Fibromyalgia 10/08/2010   Long term current use of anticoagulant 05/05/2010   Low back pain, non-specific 05/12/2008   Cognitive dysfunction associated with depression 01/29/2007   Osteoarthritis 11/22/2006   Hyperlipidemia associated with type 2 diabetes mellitus ([place]) 09/13/2006   Coronary atherosclerosis 09/13/2006   Paroxysmal atrial fibrillation ([place]) 09/13/2006   GERD 09/13/2006   Hypertension associated with diabetes ([place]) 08/30/2006    Goals Addressed   None     Follow-Up:  Pharmacist Review    Ms. Dick called the clinical pharmacist [name] today to ask about and update on her Jardiance  Patient assistant form. She stated that she an  lindsey discuss that all she needed to do was to bring in proof of income and that [name] will take care of faxing in the patient assistant form. She states that she has not heard anything from the manufacture and called to check on the status of her form. The patient states that she is completely out of the medication today and would like samples if they are available. The patient stated also if she could not get sample she would like to know the cost of a 30 ds from the pharmacy. I let the patient know that I would pass along this information to [place].    Wendy Poet, [place] [phone]

## 2020-04-14 NOTE — Progress Notes (Signed)
I have reviewed and agree.

## 2020-04-15 ENCOUNTER — Telehealth: Payer: Self-pay | Admitting: Pharmacist

## 2020-04-15 NOTE — Progress Notes (Signed)
Kristen [name] called back today for samples that she had requested for Jardiance. Patient stated yesterday that she was completely out of Jardiance. Spoke with the patient today to let her know that the clinical pharmacist [name] aside 4 wks of samples for patient to pick up.   Wendy Poet, [place] [phone]

## 2020-04-15 NOTE — Telephone Encounter (Signed)
Will provide Jardiance samples x 4 weeks.  Placed in front office cabinet.

## 2020-04-16 ENCOUNTER — Other Ambulatory Visit: Payer: Self-pay | Admitting: *Deleted

## 2020-04-17 ENCOUNTER — Ambulatory Visit: Payer: Self-pay | Admitting: *Deleted

## 2020-04-20 ENCOUNTER — Ambulatory Visit: Payer: Medicare Other

## 2020-04-20 ENCOUNTER — Telehealth: Payer: Medicare Other

## 2020-04-20 ENCOUNTER — Telehealth: Payer: Self-pay | Admitting: Pharmacist

## 2020-04-20 DIAGNOSIS — G4701 Insomnia due to medical condition: Secondary | ICD-10-CM

## 2020-04-20 DIAGNOSIS — F09 Unspecified mental disorder due to known physiological condition: Secondary | ICD-10-CM

## 2020-04-20 DIAGNOSIS — M797 Fibromyalgia: Secondary | ICD-10-CM

## 2020-04-20 DIAGNOSIS — G8929 Other chronic pain: Secondary | ICD-10-CM

## 2020-04-20 DIAGNOSIS — F32A Depression, unspecified: Secondary | ICD-10-CM

## 2020-04-20 DIAGNOSIS — Z8673 Personal history of transient ischemic attack (TIA), and cerebral infarction without residual deficits: Secondary | ICD-10-CM

## 2020-04-20 NOTE — Progress Notes (Signed)
Chronic Care Management Pharmacy Assistant   Name: Dessirae Scarola  MRN: [id number] DOB: 1945-12-22  Reason for Encounter: Medication Review   PCP : [name], MD  Allergies:   Allergies  Allergen Reactions  . Metformin And Related Other (See Comments)    Must take XR form only, cannot tolerate Regular release metformin  . Cleocin [Clindamycin Hcl] Diarrhea  . Codeine Itching  . Doxycycline Hyclate Diarrhea  . Macrolides And Ketolides Diarrhea  . Morphine Hives  . Pentazocine Lactate Itching and Nausea And Vomiting    (GENERIC- [place]win)  . Vibramycin [Doxycycline Calcium] Diarrhea and Rash  . Clindamycin Diarrhea  . Definity [Perflutren Lipid Microsphere] Other (See Comments)    Patient complained of warm sensation in chest x few seconds duration when injected Definity.No other symptoms  . Doxycycline Diarrhea  . Metformin Other (See Comments)  . Pentazocine Nausea Only  . Perflutren Other (See Comments)  . Perflutren Lipid Microspheres Other (See Comments)  . Sulfa Antibiotics Diarrhea and Rash  . Sulfonamide Derivatives Rash    Medications: Outpatient Encounter Medications as of 04/20/2020  Medication Sig  . ALPRAZolam (XANAX) 1 MG tablet Take 1 tablet (1 mg total) by mouth 3 (three) times daily as needed for anxiety.  . Ascorbic Acid (VITAMIN C) 1000 MG tablet Take 1,000 mg by mouth daily.  Marland Kitchen atorvastatin (LIPITOR) 40 MG tablet TAKE ONE TABLET BY MOUTH DAILY  . Blood Glucose Monitoring Suppl (ONE TOUCH ULTRA 2) w/Device KIT Use as advised  . Cholecalciferol (VITAMIN D3) 50 MCG (2000 UT) capsule Take 2,000 Units by mouth daily.  . cyclobenzaprine (FLEXERIL) 10 MG tablet Take 10 mg by mouth 3 (three) times daily as needed for muscle spasms.  . empagliflozin (JARDIANCE) 25 MG TABS tablet Take 1 tablet (25 mg total) by mouth daily.  . furosemide (LASIX) 20 MG tablet Take 1 tablet (20 mg total) by mouth every morning.  . gabapentin (NEURONTIN) 300 MG capsule  TAKE ONE CAPSULE BY MOUTH THREE TIMES DAILY  . glucose blood (COOL BLOOD GLUCOSE TEST STRIPS) test strip Use to test blood sugar twice daily. DX: E11.9  . insulin glargine, 2 Unit Dial, (TOUJEO MAX SOLOSTAR) 300 UNIT/ML Solostar Pen Inject 50 Units into the skin daily.  . Insulin Pen Needle (NOVOFINE) 32G X 6 MM MISC 1 Act by Does not apply route daily.  . Lancets (ONETOUCH ULTRASOFT) lancets Use as instructed  . magnesium oxide (MAG-OX) 400 MG tablet TAKE ONE TABLET BY MOUTH BY MOUTH TWICE DAILY WITH BREAKFAST AND EVENING MEAL]  . metFORMIN (GLUCOPHAGE-XR) 500 MG 24 hr tablet TAKE TWO TABLETS BY MOUTH TWICE DAILY WITH FOOD  . metoprolol succinate (TOPROL-XL) 100 MG 24 hr tablet Take 1 tablet (100 mg total) by mouth 2 (two) times daily. Take with or immediately following a meal.  . Multiple Vitamin (MULTIVITAMIN) tablet Take 1 tablet by mouth daily.  . nitroGLYCERIN (NITROSTAT) 0.4 MG SL tablet Place 1 tablet (0.4 mg total) under the tongue every 5 (five) minutes as needed for chest pain (x 3 doses). Reported on 05/08/2015  . ondansetron (ZOFRAN-ODT) 4 MG disintegrating tablet Take 1 tablet (4 mg total) by mouth every 8 (eight) hours as needed for nausea or vomiting.  Marland Kitchen PERCOCET 10-325 MG tablet Take 1 tablet by mouth 5 (five) times daily as needed.  . potassium chloride SA (KLOR-CON) 20 MEQ tablet Take 1 tablet (20 mEq total) by mouth 2 (two) times daily.  . Semaglutide, 1 MG/DOSE, (OZEMPIC, 1  MG/DOSE,) 2 MG/1.5ML SOPN Inject 2 mg into the skin once a week. (Patient taking differently: Inject 1 mg into the skin once a week.)  . spironolactone (ALDACTONE) 25 MG tablet Take 1 tablet (25 mg total) by mouth 2 (two) times daily. (Patient taking differently: Take 25 mg by mouth daily.)  . telmisartan (MICARDIS) 40 MG tablet TAKE ONE TABLET BY MOUTH DAILY  . thiamine 100 MG tablet Take 1 tablet (100 mg total) by mouth daily.  Marland Kitchen triamcinolone cream (KENALOG) 0.5 % Apply 1 application topically 3 (three)  times daily.  Marland Kitchen warfarin (COUMADIN) 3 MG tablet TAKE ONE TABLET BY MOUTH ONCE DAILY OR USE AS DIRECTED by clinic   No facility-administered encounter medications on file as of 04/20/2020.    Current Diagnosis: Patient Active Problem List   Diagnosis Date Noted  . Poor dentition requiring referral to dentistry 03/26/2020  . Thiamine deficiency 12/30/2018  . Vitamin B12 deficiency anemia due to intrinsic factor deficiency 12/20/2018  . CHF (congestive heart failure), NYHA class I, chronic, combined ([place]) 12/19/2018  . Coronary artery disease involving native coronary artery of native heart with angina pectoris ([place]) 12/19/2018  . Obesity with body mass index (BMI) of 30.0 to 39.9 01/15/2018  . Chronic idiopathic constipation 12/27/2017  . Atrial fibrillation ([place]) 10/30/2017  . Type 2 diabetes mellitus ([place]) 10/30/2017  . Eczema 08/07/2017  . Insomnia secondary to chronic pain 08/04/2016  . Visit for screening mammogram 04/14/2016  . LV dysfunction   . Routine general medical examination at a health care facility 05/03/2013  . DM (diabetes mellitus), type 2 with peripheral vascular complications ([place]) 75/88/3254  . PVC's/nonsustained VT 10/24/2012  . H/O: CVA (cerebrovascular accident) 02/09/2012  . Hypothyroidism 05/27/2011  . Fibromyalgia 10/08/2010  . Long term current use of anticoagulant 05/05/2010  . Low back pain, non-specific 05/12/2008  . Cognitive dysfunction associated with depression 01/29/2007  . Osteoarthritis 11/22/2006  . Hyperlipidemia associated with type 2 diabetes mellitus ([place]) 09/13/2006  . Coronary atherosclerosis 09/13/2006  . Paroxysmal atrial fibrillation ([place]) 09/13/2006  . GERD 09/13/2006  . Hypertension associated with diabetes ([place]) 08/30/2006    Reviewed chart for medication changes ahead of medication coordination call.  No OVs, Consults, or hospital visits since last care coordination call/Pharmacist visit. (If appropriate, list visit date,  provider name)  No medication changes indicated OR if recent visit, treatment plan here.  BP Readings from Last 3 Encounters:  03/26/20 138/70  11/19/19 104/66  10/16/19 112/72    Lab Results  Component Value Date   HGBA1C 8.0 (A) 03/26/2020     Patient obtains medications through Vials  90 Days    Patient is due for next adherence delivery on: 04-26-20. Called patient and reviewed medications. [facility] because she never received her furosemide 20 mg Tab and they confirmed that they would be delivering this afternoon. Patient stated that was the only medication she was needing at this time and is up to date with all other medications until April .   Georgiana Shore ,[place] Pharmacist Assistant [phone]   Follow-Up:  Pharmacist Review

## 2020-04-24 ENCOUNTER — Other Ambulatory Visit: Payer: Self-pay

## 2020-04-24 NOTE — Chronic Care Management (AMB) (Signed)
Chronic Care Management    Clinical Social Work Note  04/24/2020 Name: Kristen [name] MRN: [id number] DOB: 1945-03-27  Kristen [name] Barbarann [name] is a 75 y.o. year old female who is a primary care patient of [name], MD. The CCM team was consulted to assist the patient with chronic disease management and/or care coordination needs related to: Mental Health Counseling and Resources and Grief Counseling.   Engaged with patient by telephone for follow up visit in response to provider referral for social work chronic care management and care coordination services.   Consent to Services:  The patient was given information about Chronic Care Management services, agreed to services, and gave verbal consent prior to initiation of services.  Please see initial visit note for detailed documentation.   Patient agreed to services and consent obtained.   Assessment: Review of patient past medical history, allergies, medications, and health status, including review of relevant consultants reports was performed today as part of a comprehensive evaluation and provision of chronic care management and care coordination services.     SDOH (Social Determinants of Health) assessments and interventions performed:    Advanced Directives Status: Not addressed in this encounter.  CCM Care Plan  Allergies  Allergen Reactions  . Metformin And Related Other (See Comments)    Must take XR form only, cannot tolerate Regular release metformin  . Cleocin [Clindamycin Hcl] Diarrhea  . Codeine Itching  . Doxycycline Hyclate Diarrhea  . Macrolides And Ketolides Diarrhea  . Morphine Hives  . Pentazocine Lactate Itching and Nausea And Vomiting    (GENERIC- [place]win)  . Vibramycin [Doxycycline Calcium] Diarrhea and Rash  . Clindamycin Diarrhea  . Definity [Perflutren Lipid Microsphere] Other (See Comments)    Patient complained of warm sensation in chest x few seconds duration when injected Definity.No other  symptoms  . Doxycycline Diarrhea  . Metformin Other (See Comments)  . Pentazocine Nausea Only  . Perflutren Other (See Comments)  . Perflutren Lipid Microspheres Other (See Comments)  . Sulfa Antibiotics Diarrhea and Rash  . Sulfonamide Derivatives Rash    Outpatient Encounter Medications as of 04/14/2020  Medication Sig  . ALPRAZolam (XANAX) 1 MG tablet Take 1 tablet (1 mg total) by mouth 3 (three) times daily as needed for anxiety.  . Ascorbic Acid (VITAMIN C) 1000 MG tablet Take 1,000 mg by mouth daily.  Marland Kitchen atorvastatin (LIPITOR) 40 MG tablet TAKE ONE TABLET BY MOUTH DAILY  . Blood Glucose Monitoring Suppl (ONE TOUCH ULTRA 2) w/Device KIT Use as advised  . Cholecalciferol (VITAMIN D3) 50 MCG (2000 UT) capsule Take 2,000 Units by mouth daily.  . cyclobenzaprine (FLEXERIL) 10 MG tablet Take 10 mg by mouth 3 (three) times daily as needed for muscle spasms.  . empagliflozin (JARDIANCE) 25 MG TABS tablet Take 1 tablet (25 mg total) by mouth daily.  . furosemide (LASIX) 20 MG tablet Take 1 tablet (20 mg total) by mouth every morning.  . gabapentin (NEURONTIN) 300 MG capsule TAKE ONE CAPSULE BY MOUTH THREE TIMES DAILY  . glucose blood (COOL BLOOD GLUCOSE TEST STRIPS) test strip Use to test blood sugar twice daily. DX: E11.9  . insulin glargine, 2 Unit Dial, (TOUJEO MAX SOLOSTAR) 300 UNIT/ML Solostar Pen Inject 50 Units into the skin daily.  . Insulin Pen Needle (NOVOFINE) 32G X 6 MM MISC 1 Act by Does not apply route daily.  . Lancets (ONETOUCH ULTRASOFT) lancets Use as instructed  . magnesium oxide (MAG-OX) 400 MG tablet TAKE ONE TABLET BY  MOUTH BY MOUTH TWICE DAILY WITH BREAKFAST AND EVENING MEAL]  . metFORMIN (GLUCOPHAGE-XR) 500 MG 24 hr tablet TAKE TWO TABLETS BY MOUTH TWICE DAILY WITH FOOD  . metoprolol succinate (TOPROL-XL) 100 MG 24 hr tablet Take 1 tablet (100 mg total) by mouth 2 (two) times daily. Take with or immediately following a meal.  . Multiple Vitamin (MULTIVITAMIN) tablet Take  1 tablet by mouth daily.  . nitroGLYCERIN (NITROSTAT) 0.4 MG SL tablet Place 1 tablet (0.4 mg total) under the tongue every 5 (five) minutes as needed for chest pain (x 3 doses). Reported on 05/08/2015  . ondansetron (ZOFRAN-ODT) 4 MG disintegrating tablet Take 1 tablet (4 mg total) by mouth every 8 (eight) hours as needed for nausea or vomiting.  Marland Kitchen PERCOCET 10-325 MG tablet Take 1 tablet by mouth 5 (five) times daily as needed.  . potassium chloride SA (KLOR-CON) 20 MEQ tablet Take 1 tablet (20 mEq total) by mouth 2 (two) times daily.  . Semaglutide, 1 MG/DOSE, (OZEMPIC, 1 MG/DOSE,) 2 MG/1.5ML SOPN Inject 2 mg into the skin once a week. (Patient taking differently: Inject 1 mg into the skin once a week.)  . spironolactone (ALDACTONE) 25 MG tablet Take 1 tablet (25 mg total) by mouth 2 (two) times daily. (Patient taking differently: Take 25 mg by mouth daily.)  . telmisartan (MICARDIS) 40 MG tablet TAKE ONE TABLET BY MOUTH DAILY  . thiamine 100 MG tablet Take 1 tablet (100 mg total) by mouth daily.  Marland Kitchen triamcinolone cream (KENALOG) 0.5 % Apply 1 application topically 3 (three) times daily.  Marland Kitchen warfarin (COUMADIN) 3 MG tablet TAKE ONE TABLET BY MOUTH ONCE DAILY OR USE AS DIRECTED by clinic   No facility-administered encounter medications on file as of 04/14/2020.    Patient Active Problem List   Diagnosis Date Noted  . Poor dentition requiring referral to dentistry 03/26/2020  . Thiamine deficiency 12/30/2018  . Vitamin B12 deficiency anemia due to intrinsic factor deficiency 12/20/2018  . CHF (congestive heart failure), NYHA class I, chronic, combined (HCC) 12/19/2018  . Coronary artery disease involving native coronary artery of native heart with angina pectoris (HCC) 12/19/2018  . Obesity with body mass index (BMI) of 30.0 to 39.9 01/15/2018  . Chronic idiopathic constipation 12/27/2017  . Atrial fibrillation (HCC) 10/30/2017  . Type 2 diabetes mellitus (HCC) 10/30/2017  . Eczema 08/07/2017  .  Insomnia secondary to chronic pain 08/04/2016  . Visit for screening mammogram 04/14/2016  . LV dysfunction   . Routine general medical examination at a health care facility 05/03/2013  . DM (diabetes mellitus), type 2 with peripheral vascular complications (HCC) 12/01/2012  . PVC's/nonsustained VT 10/24/2012  . H/O: CVA (cerebrovascular accident) 02/09/2012  . Hypothyroidism 05/27/2011  . Fibromyalgia 10/08/2010  . Long term current use of anticoagulant 05/05/2010  . Low back pain, non-specific 05/12/2008  . Cognitive dysfunction associated with depression 01/29/2007  . Osteoarthritis 11/22/2006  . Hyperlipidemia associated with type 2 diabetes mellitus (HCC) 09/13/2006  . Coronary atherosclerosis 09/13/2006  . Paroxysmal atrial fibrillation (HCC) 09/13/2006  . GERD 09/13/2006  . Hypertension associated with diabetes (HCC) 08/30/2006    Conditions to be addressed/monitored: Anxiety and Depression; Limited social support and Mental Health Concerns   Care Plan : LCSW Plan of Care  Updates made by Buck Mam, LCSW since 04/24/2020 12:00 AM    Problem: Lacks strategy for management of anxiety around sleeping and fear of dying.   Priority: High    Long-Range Goal: Establish and Maintain  plan for anxiety management   Start Date: 04/09/2020  Expected End Date: 07/08/2020  This Visit's Progress: On track  Recent Progress: On track  Priority: High  Note:   Current Barriers:  . Chronic Mental Health needs related to anxiety/PTSD - has participated in an initial visit with [facility] clinician and was not set up for follow up. Pt has decided to pause on re-scheduling and/or connecting with new mental health therapist, . Limited social support, Mental Health Concerns , and Family and relationship dysfunction . Suicidal Ideation/Homicidal Ideation: No Clinical Social Work Goal(s):  Marland Kitchen Over the next 90 days, patient will work with SW monthly by telephone to reduce or manage symptoms related to  anxiety Interventions: . Discussed patient's desire to not pursue scheduling more appointments with recent therapist ,however need for further assessment and and treatment of symptoms . Discussed plans with patient for ongoing care management follow up and provided patient with direct contact information for care management team . Solution-Focused Strategies, Emotional/Supportive Counseling, Psychoeducation and/or Health Education, and Problem Solving Patient Coping Strengths:  . Hopefulness . Appointment with Dr Casimiro Needle this  week Patient Self Care Deficits:  . Lacks social connections Patient Goals:   -follow up with Dr Casimiro Needle - practice relaxation or meditation daily - talk about feelings with a friend, family or spiritual advisor - practice positive thinking and self-talk   Follow Up Plan: Appointment scheduled for SW follow up with client by phone on:  05/05/2020 at 2pm.      Follow Up Plan: Appointment scheduled for SW follow up with client by phone on: 05/05/2020      Eduard Clos MSW, [place] Licensed Clinical Social Worker [place] [phone]

## 2020-04-24 NOTE — Chronic Care Management (AMB) (Signed)
Chronic Care Management    Clinical Social Work Note  04/24/2020 Name: Kristen [name] MRN: [id number] DOB: 1945-11-09  Kristen [name] [name] is a 75 y.o. year old female who is a primary care patient of [name], MD. The CCM team was consulted to assist the patient with chronic disease management and/or care coordination needs related to: Mental Health Counseling and Resources.   Engaged with patient by telephone for follow up visit in response to provider referral for social work chronic care management and care coordination services.   Consent to Services:  The patient was given information about Chronic Care Management services, agreed to services, and gave verbal consent prior to initiation of services.  Please see initial visit note for detailed documentation.   Patient agreed to services and consent obtained.   Assessment: Review of patient past medical history, allergies, medications, and health status, including review of relevant consultants reports was performed today as part of a comprehensive evaluation and provision of chronic care management and care coordination services.     SDOH (Social Determinants of Health) assessments and interventions performed:    Advanced Directives Status: Not addressed in this encounter.  CCM Care Plan  Allergies  Allergen Reactions  . Metformin And Related Other (See Comments)    Must take XR form only, cannot tolerate Regular release metformin  . Cleocin [Clindamycin Hcl] Diarrhea  . Codeine Itching  . Doxycycline Hyclate Diarrhea  . Macrolides And Ketolides Diarrhea  . Morphine Hives  . Pentazocine Lactate Itching and Nausea And Vomiting    (GENERIC- [place])  . Vibramycin [Doxycycline Calcium] Diarrhea and Rash  . Clindamycin Diarrhea  . Definity [Perflutren Lipid Microsphere] Other (See Comments)    Patient complained of warm sensation in chest x few seconds duration when injected Definity.No other symptoms  .  Doxycycline Diarrhea  . Metformin Other (See Comments)  . Pentazocine Nausea Only  . Perflutren Other (See Comments)  . Perflutren Lipid Microspheres Other (See Comments)  . Sulfa Antibiotics Diarrhea and Rash  . Sulfonamide Derivatives Rash    Outpatient Encounter Medications as of 04/20/2020  Medication Sig  . ALPRAZolam (XANAX) 1 MG tablet Take 1 tablet (1 mg total) by mouth 3 (three) times daily as needed for anxiety.  . Ascorbic Acid (VITAMIN C) 1000 MG tablet Take 1,000 mg by mouth daily.  Marland Kitchen atorvastatin (LIPITOR) 40 MG tablet TAKE ONE TABLET BY MOUTH DAILY  . Blood Glucose Monitoring Suppl (ONE TOUCH ULTRA 2) w/Device KIT Use as advised  . Cholecalciferol (VITAMIN D3) 50 MCG (2000 UT) capsule Take 2,000 Units by mouth daily.  . cyclobenzaprine (FLEXERIL) 10 MG tablet Take 10 mg by mouth 3 (three) times daily as needed for muscle spasms.  . empagliflozin (JARDIANCE) 25 MG TABS tablet Take 1 tablet (25 mg total) by mouth daily.  . furosemide (LASIX) 20 MG tablet Take 1 tablet (20 mg total) by mouth every morning.  . gabapentin (NEURONTIN) 300 MG capsule TAKE ONE CAPSULE BY MOUTH THREE TIMES DAILY  . glucose blood (COOL BLOOD GLUCOSE TEST STRIPS) test strip Use to test blood sugar twice daily. DX: E11.9  . insulin glargine, 2 Unit Dial, (TOUJEO MAX SOLOSTAR) 300 UNIT/ML Solostar Pen Inject 50 Units into the skin daily.  . Insulin Pen Needle (NOVOFINE) 32G X 6 MM MISC 1 Act by Does not apply route daily.  . Lancets (ONETOUCH ULTRASOFT) lancets Use as instructed  . magnesium oxide (MAG-OX) 400 MG tablet TAKE ONE TABLET BY MOUTH BY MOUTH  TWICE DAILY WITH BREAKFAST AND EVENING MEAL]  . metFORMIN (GLUCOPHAGE-XR) 500 MG 24 hr tablet TAKE TWO TABLETS BY MOUTH TWICE DAILY WITH FOOD  . metoprolol succinate (TOPROL-XL) 100 MG 24 hr tablet Take 1 tablet (100 mg total) by mouth 2 (two) times daily. Take with or immediately following a meal.  . Multiple Vitamin (MULTIVITAMIN) tablet Take 1 tablet by  mouth daily.  . nitroGLYCERIN (NITROSTAT) 0.4 MG SL tablet Place 1 tablet (0.4 mg total) under the tongue every 5 (five) minutes as needed for chest pain (x 3 doses). Reported on 05/08/2015  . ondansetron (ZOFRAN-ODT) 4 MG disintegrating tablet Take 1 tablet (4 mg total) by mouth every 8 (eight) hours as needed for nausea or vomiting.  Marland Kitchen PERCOCET 10-325 MG tablet Take 1 tablet by mouth 5 (five) times daily as needed.  . potassium chloride SA (KLOR-CON) 20 MEQ tablet Take 1 tablet (20 mEq total) by mouth 2 (two) times daily.  . Semaglutide, 1 MG/DOSE, (OZEMPIC, 1 MG/DOSE,) 2 MG/1.5ML SOPN Inject 2 mg into the skin once a week. (Patient taking differently: Inject 1 mg into the skin once a week.)  . spironolactone (ALDACTONE) 25 MG tablet Take 1 tablet (25 mg total) by mouth 2 (two) times daily. (Patient taking differently: Take 25 mg by mouth daily.)  . telmisartan (MICARDIS) 40 MG tablet TAKE ONE TABLET BY MOUTH DAILY  . thiamine 100 MG tablet Take 1 tablet (100 mg total) by mouth daily.  Marland Kitchen triamcinolone cream (KENALOG) 0.5 % Apply 1 application topically 3 (three) times daily.  Marland Kitchen warfarin (COUMADIN) 3 MG tablet TAKE ONE TABLET BY MOUTH ONCE DAILY OR USE AS DIRECTED by clinic   No facility-administered encounter medications on file as of 04/20/2020.    Patient Active Problem List   Diagnosis Date Noted  . Poor dentition requiring referral to dentistry 03/26/2020  . Thiamine deficiency 12/30/2018  . Vitamin B12 deficiency anemia due to intrinsic factor deficiency 12/20/2018  . CHF (congestive heart failure), NYHA class I, chronic, combined ([place]) 12/19/2018  . Coronary artery disease involving native coronary artery of native heart with angina pectoris ([place]) 12/19/2018  . Obesity with body mass index (BMI) of 30.0 to 39.9 01/15/2018  . Chronic idiopathic constipation 12/27/2017  . Atrial fibrillation ([place]) 10/30/2017  . Type 2 diabetes mellitus ([place]) 10/30/2017  . Eczema 08/07/2017  . Insomnia  secondary to chronic pain 08/04/2016  . Visit for screening mammogram 04/14/2016  . LV dysfunction   . Routine general medical examination at a health care facility 05/03/2013  . DM (diabetes mellitus), type 2 with peripheral vascular complications ([place]) 03/55/9741  . PVC's/nonsustained VT 10/24/2012  . H/O: CVA (cerebrovascular accident) 02/09/2012  . Hypothyroidism 05/27/2011  . Fibromyalgia 10/08/2010  . Long term current use of anticoagulant 05/05/2010  . Low back pain, non-specific 05/12/2008  . Cognitive dysfunction associated with depression 01/29/2007  . Osteoarthritis 11/22/2006  . Hyperlipidemia associated with type 2 diabetes mellitus ([place]) 09/13/2006  . Coronary atherosclerosis 09/13/2006  . Paroxysmal atrial fibrillation ([place]) 09/13/2006  . GERD 09/13/2006  . Hypertension associated with diabetes ([place]) 08/30/2006    Conditions to be addressed/monitored: Depression; Mental Health Concerns   Care Plan : LCSW Plan of Care  Updates made by Deirdre Peer, LCSW since 04/24/2020 12:00 AM    Problem: Lacks strategy for management of anxiety around sleeping and fear of dying.   Priority: High    Long-Range Goal: Establish and Maintain plan for anxiety management   Start Date: 04/09/2020  Expected End Date: 07/08/2020  This Visit's Progress: On track  Recent Progress: On track  Priority: High  Note:   Current Barriers:  . Chronic Mental Health needs related to anxiety/PTSD - has participated in an initial visit with [facility] clinician and was not set up for follow up. Pt has decided to pause and does not plan on re-scheduling and/or connecting with new mental health therapist at this time. . Limited social support, Mental Health Concerns , and Family and relationship dysfunction . Suicidal Ideation/Homicidal Ideation: No Clinical Social Work Goal(s):  Marland Kitchen Over the next 90 days, patient will work with SW monthly by telephone to reduce or manage symptoms related to  anxiety Interventions: . Discussed patient's desire to not pursue scheduling more appointments with recent therapist ,however need for further assessment and and treatment of symptoms . Discussed plans with patient for ongoing care management follow up and provided patient with direct contact information for care management team . Solution-Focused Strategies, Emotional/Supportive Counseling, Psychoeducation and/or Health Education, and Problem Solving Patient Coping Strengths:  . Hopefulness . Appointment with Dr Casimiro Needle this  week Patient Self Care Deficits:  . Lacks social connections Patient Goals:   -follow up with Dr Casimiro Needle - practice relaxation or meditation daily - talk about feelings with a friend, family or spiritual advisor - practice positive thinking and self-talk   Follow Up Plan: Appointment scheduled for SW follow up with client by phone on:  05/05/2020 at 2pm.      Follow Up Plan: Appointment scheduled for SW follow up with client by phone on: 05/05/2020      Eduard Clos MSW, [place] Licensed Clinical Social Worker [place] [phone]

## 2020-04-24 NOTE — Patient Instructions (Signed)
Visit Information  The patient verbalized understanding of instructions, educational materials, and care plan provided today and declined offer to receive copy of patient instructions, educational materials, and care plan.   Telephone follow up appointment with care management team member scheduled for:05/05/2020  Eduard Clos MSW, LCSW Licensed Clinical Social Worker [place] [phone]

## 2020-04-29 ENCOUNTER — Other Ambulatory Visit: Payer: Self-pay

## 2020-04-29 ENCOUNTER — Telehealth (INDEPENDENT_AMBULATORY_CARE_PROVIDER_SITE_OTHER): Payer: Medicare Other | Admitting: Psychiatry

## 2020-04-29 DIAGNOSIS — F4323 Adjustment disorder with mixed anxiety and depressed mood: Secondary | ICD-10-CM

## 2020-04-29 MED ORDER — ALPRAZOLAM 1 MG PO TABS: 1.0000 mg | ORAL_TABLET | Freq: Three times a day (TID) | ORAL | 5 refills | Status: DC | PRN

## 2020-04-29 NOTE — Progress Notes (Signed)
She is only fair. None Patient ID: Kristen [name], female   DOB: [birth year], 75 y.o.   MRN: [id number] [facility] MD Progress Note  04/29/2020 4:05 PM Kristen [name]  MRN:  [id number] Subjective:  Scared Principal Problem: Adjustment disorder with an anxious mood state  Today the patient is only doing fairly well.  Apparently tried healthcare is involved with her and attempted to get the patient a therapist in our setting.  Apparently did not work out.  Patient does have some issues with her husband.  Her husband who has PTSD can be angry and irritable.  This causes a lot of marital discord.  The patient denies being persistently depressed.  She is sleeping and eating fairly well.  She has chronic shoulder pain is unable to take nonsteroid anti-inflammatory drugs because she is on a blood thinner.  She is had a CVA in the past.  Dr. Nicholaus Bloom prescribes opiates for her.  My patient takes Xanax 1 mg 3 times daily and does very well.  Her anxiety level is well controlled.  He has been on this agent for years.  The patient has no evidence of psychosis.  She drinks no alcohol and does not use any drugs.  She is very engageable and friendly and lucid. Past Medical History:  Diagnosis Date   Arthritis    Blood transfusion without reported diagnosis    Bursitis    CAD (coronary artery disease)    a. s/p CABG 2004.b. stable cath 2014 demonstrating stable CAD and continued patency of her LIMA graft.   Chronic anticoagulation    on coumadin   DDD (degenerative disc disease), lumbar    Depression    Diabetes mellitus    Diastolic dysfunction    per echo in October 2012 with EF 50 to 55%   Fibromyalgia    GERD (gastroesophageal reflux disease) 10/23/2003   Headache(784.0)    Hyperlipidemia    Hypertension    Hypokalemia    LBBB (left bundle branch block)    Lumbar back pain    LV dysfunction    a. EF 45% by cath 2014. b. EF 50-55% by technically difficult echo in  08/2014.   Lymphadenitis    Morbid obesity ([name])    a. Sleep study negative for significant OSA in 11/2014.   PAF (paroxysmal atrial fibrillation) ([place])    Stroke ([place]) 2004   affected speech per pt    Past Surgical History:  Procedure Laterality Date   ABDOMINAL HYSTERECTOMY     ANGIOPLASTY  laminectomy   CHOLECYSTECTOMY     CORONARY ARTERY BYPASS GRAFT     LIMA to LAD    KNEE ARTHROSCOPY     LEFT HEART CATHETERIZATION WITH CORONARY/GRAFT ANGIOGRAM  12/07/2012   Procedure: LEFT HEART CATHETERIZATION WITH Beatrix Fetters;  Surgeon: Blane Ohara, MD;  Location: [facility] CATH LAB;  Service: Cardiovascular;;   LUMBAR LAMINECTOMY     x3   SPHINCTEROTOMY     TONSILLECTOMY     Family History:  Family History  Problem Relation Age of Onset   Heart attack Father    Heart disease Father    Stroke Mother    Kidney disease Other    Stroke Other    Arthritis Other    Hypertension Other    Diabetes Other    Colon cancer Paternal Uncle    Depression Sister    Anxiety disorder Maternal Aunt    Family Psychiatric  History:  Social History:  Social History  Substance and Sexual Activity  Alcohol Use No     Social History   Substance and Sexual Activity  Drug Use No    Social History   Socioeconomic History   Marital status: Married    Spouse name: Not on file   Number of children: Not on file   Years of education: Not on file   Highest education level: Not on file  Occupational History   Not on file  Tobacco Use   Smoking status: Former Smoker    Quit date: 03/07/2001    Years since quitting: 19.1   Smokeless tobacco: Never Used  Scientific laboratory technician Use: Never used  Substance and Sexual Activity   Alcohol use: No   Drug use: No   Sexual activity: Not Currently  Other Topics Concern   Not on file  Social History Narrative   Lives locally, has help available if needed.   Social Determinants of Health   Financial  Resource Strain: High Risk   Difficulty of Paying Living Expenses: Hard  Food Insecurity: No Food Insecurity   Worried About Charity fundraiser in the Last Year: Never true   Ran Out of Food in the Last Year: Never true  Transportation Needs: No Transportation Needs   Lack of Transportation (Medical): No   Lack of Transportation (Non-Medical): No  Physical Activity: Inactive   Days of Exercise per Week: 0 days   Minutes of Exercise per Session: 0 min  Stress: Stress Concern Present   Feeling of Stress : To some extent  Social Connections: Moderately Isolated   Frequency of Communication with Friends and Family: More than three times a week   Frequency of Social Gatherings with Friends and Family: Never   Attends Religious Services: Never   Marine scientist or Organizations: No   Attends Music therapist: Never   Marital Status: Married   Additional Social History:                         Sleep: Fair  Appetite:  Fair  Current Medications: Current Outpatient Medications  Medication Sig Dispense Refill   ALPRAZolam (XANAX) 1 MG tablet Take 1 tablet (1 mg total) by mouth 3 (three) times daily as needed for anxiety. 90 tablet 5   Ascorbic Acid (VITAMIN C) 1000 MG tablet Take 1,000 mg by mouth daily.     atorvastatin (LIPITOR) 40 MG tablet TAKE ONE TABLET BY MOUTH DAILY 90 tablet 1   Blood Glucose Monitoring Suppl (ONE TOUCH ULTRA 2) w/Device KIT Use as advised 1 each 0   Cholecalciferol (VITAMIN D3) 50 MCG (2000 UT) capsule Take 2,000 Units by mouth daily.     cyclobenzaprine (FLEXERIL) 10 MG tablet Take 10 mg by mouth 3 (three) times daily as needed for muscle spasms.     empagliflozin (JARDIANCE) 25 MG TABS tablet Take 1 tablet (25 mg total) by mouth daily. 90 tablet 1   furosemide (LASIX) 20 MG tablet Take 1 tablet (20 mg total) by mouth every morning. 90 tablet 1   gabapentin (NEURONTIN) 300 MG capsule TAKE ONE CAPSULE BY  MOUTH THREE TIMES DAILY 270 capsule 0   glucose blood (COOL BLOOD GLUCOSE TEST STRIPS) test strip Use to test blood sugar twice daily. DX: E11.9 100 each 12   insulin glargine, 2 Unit Dial, (TOUJEO MAX SOLOSTAR) 300 UNIT/ML Solostar Pen Inject 50 Units into the skin daily. 15 mL 5   Insulin  Pen Needle (NOVOFINE) 32G X 6 MM MISC 1 Act by Does not apply route daily. 100 each 3   Lancets (ONETOUCH ULTRASOFT) lancets Use as instructed 100 each 12   magnesium oxide (MAG-OX) 400 MG tablet TAKE ONE TABLET BY MOUTH BY MOUTH TWICE DAILY WITH BREAKFAST AND EVENING MEAL] 180 tablet 2   metFORMIN (GLUCOPHAGE-XR) 500 MG 24 hr tablet TAKE TWO TABLETS BY MOUTH TWICE DAILY WITH FOOD 260 tablet 1   metoprolol succinate (TOPROL-XL) 100 MG 24 hr tablet Take 1 tablet (100 mg total) by mouth 2 (two) times daily. Take with or immediately following a meal. 180 tablet 2   Multiple Vitamin (MULTIVITAMIN) tablet Take 1 tablet by mouth daily.     nitroGLYCERIN (NITROSTAT) 0.4 MG SL tablet Place 1 tablet (0.4 mg total) under the tongue every 5 (five) minutes as needed for chest pain (x 3 doses). Reported on 05/08/2015 35 tablet 2   ondansetron (ZOFRAN-ODT) 4 MG disintegrating tablet Take 1 tablet (4 mg total) by mouth every 8 (eight) hours as needed for nausea or vomiting. 60 tablet 2   PERCOCET 10-325 MG tablet Take 1 tablet by mouth 5 (five) times daily as needed.     potassium chloride SA (KLOR-CON) 20 MEQ tablet Take 1 tablet (20 mEq total) by mouth 2 (two) times daily. 180 tablet 2   Semaglutide, 1 MG/DOSE, (OZEMPIC, 1 MG/DOSE,) 2 MG/1.5ML SOPN Inject 2 mg into the skin once a week. (Patient taking differently: Inject 1 mg into the skin once a week.) 18 mL 1   spironolactone (ALDACTONE) 25 MG tablet Take 1 tablet (25 mg total) by mouth 2 (two) times daily. (Patient taking differently: Take 25 mg by mouth daily.) 180 tablet 1   telmisartan (MICARDIS) 40 MG tablet TAKE ONE TABLET BY MOUTH DAILY 90 tablet 1    thiamine 100 MG tablet Take 1 tablet (100 mg total) by mouth daily. 90 tablet 1   triamcinolone cream (KENALOG) 0.5 % Apply 1 application topically 3 (three) times daily. 60 g 3   warfarin (COUMADIN) 3 MG tablet TAKE ONE TABLET BY MOUTH ONCE DAILY OR USE AS DIRECTED by clinic 60 tablet 3   No current facility-administered medications for this visit.    Lab Results:  No results found. However, due to the size of the patient record, not all encounters were searched. Please check Results Review for a complete set of results.  Physical Findings: AIMS:  , ,  ,  ,    CIWA:    COWS:     Musculoskeletal: Strength & Muscle Tone: within normal limits Gait & Station: normal Patient leans: Right  Psychiatric Specialty Exam: ROS  There were no vitals taken for this visit.There is no height or weight on file to calculate BMI.  General Appearance: NA  Eye Contact::  Good  Speech:  Clear and Coherent  Volume:  Normal  Mood:  NA  Affect:  Congruent  Thought Process:  Coherent  Orientation:  Full (Time, Place, and Person)  Thought Content:  WDL  Suicidal Thoughts:  No  Homicidal Thoughts:  No  Memory:  NA  Judgement:  Good  Insight:  Good  Psychomotor Activity:  Normal  Concentration:  Good  Recall:  Good  Fund of Knowledge:Fair  Language: Good  Akathisia:  No  Handed:  Left  AIMS (if indicated):     Assets:  Desire for Improvement  ADL's:  Intact  Cognition: WNL    This patient's first problem is an  adjustment disorder with an anxious mood state.  Milus Banister related a lot to her pain condition in her shoulder is now also related to her husband and difficulties with him.  Her interventions will be to continue the Xanax 1 mg 3 times daily we will also make an effort to get the patient in to see a therapist in our office.  This patient will be seen again in 2 to 3 months.

## 2020-05-01 ENCOUNTER — Ambulatory Visit: Payer: Medicare Other | Admitting: *Deleted

## 2020-05-01 DIAGNOSIS — E1151 Type 2 diabetes mellitus with diabetic peripheral angiopathy without gangrene: Secondary | ICD-10-CM

## 2020-05-01 DIAGNOSIS — F32A Depression, unspecified: Secondary | ICD-10-CM

## 2020-05-01 DIAGNOSIS — F09 Unspecified mental disorder due to known physiological condition: Secondary | ICD-10-CM

## 2020-05-01 DIAGNOSIS — I5042 Chronic combined systolic (congestive) and diastolic (congestive) heart failure: Secondary | ICD-10-CM

## 2020-05-01 NOTE — Telephone Encounter (Addendum)
Received message from [facility] that Jardiance patient assistance is approved through 03/06/21.  [employer] requested clarification regarding Ozempic dose. Faxed paperwork on 04/29/20.

## 2020-05-01 NOTE — Chronic Care Management (AMB) (Signed)
Chronic Care Management   CCM RN Visit Note  05/01/2020 Name: Kristen [name] MRN: [id number] DOB: 1946/02/28  Subjective: Kristen [name] is a 75 y.o. year old female who is a primary care patient of Etta Grandchild, MD. The care management team was consulted for assistance with disease management and care coordination needs.    Engaged with patient by telephone for initial visit in response to provider referral for case management and/or care coordination services.   Consent to Services:  The patient was given information about Chronic Care Management services, agreed to services, and gave verbal consent prior to initiation of services.  Please see initial visit note for detailed documentation.   Patient agreed to services and verbal consent obtained.   Assessment: Review of patient past medical history, allergies, medications, health status, including review of consultants reports, laboratory and other test data, was performed as part of comprehensive evaluation and provision of chronic care management services.   SDOH (Social Determinants of Health) assessments and interventions performed:  SDOH Interventions   Flowsheet Row Most Recent Value  SDOH Interventions   Food Insecurity Interventions Intervention Not Indicated  Financial Strain Interventions Other (Comment)  [CCM Pharmacist active for PAP]  Transportation Interventions Intervention Not Indicated      CCM Care Plan  Allergies  Allergen Reactions  . Metformin And Related Other (See Comments)    Must take XR form only, cannot tolerate Regular release metformin  . Cleocin [Clindamycin Hcl] Diarrhea  . Codeine Itching  . Doxycycline Hyclate Diarrhea  . Macrolides And Ketolides Diarrhea  . Morphine Hives  . Pentazocine Lactate Itching and Nausea And Vomiting    (GENERIC- [place])  . Vibramycin [Doxycycline Calcium] Diarrhea and Rash  . Clindamycin Diarrhea  . Definity [Perflutren Lipid Microsphere] Other  (See Comments)    Patient complained of warm sensation in chest x few seconds duration when injected Definity.No other symptoms  . Doxycycline Diarrhea  . Metformin Other (See Comments)  . Pentazocine Nausea Only  . Perflutren Other (See Comments)  . Perflutren Lipid Microspheres Other (See Comments)  . Sulfa Antibiotics Diarrhea and Rash  . Sulfonamide Derivatives Rash    Outpatient Encounter Medications as of 05/01/2020  Medication Sig Note  . empagliflozin (JARDIANCE) 25 MG TABS tablet Take 1 tablet (25 mg total) by mouth daily. 05/01/2020: 05/01/20: Patient reports she is about to run out; awaiting follow up from PAP; message sent to [facility]  . insulin glargine, 2 Unit Dial, (TOUJEO MAX SOLOSTAR) 300 UNIT/ML Solostar Pen Inject 50 Units into the skin daily.   . metFORMIN (GLUCOPHAGE-XR) 500 MG 24 hr tablet TAKE TWO TABLETS BY MOUTH TWICE DAILY WITH FOOD   . ALPRAZolam (XANAX) 1 MG tablet Take 1 tablet (1 mg total) by mouth 3 (three) times daily as needed for anxiety.   . Ascorbic Acid (VITAMIN C) 1000 MG tablet Take 1,000 mg by mouth daily.   Marland Kitchen atorvastatin (LIPITOR) 40 MG tablet TAKE ONE TABLET BY MOUTH DAILY   . Blood Glucose Monitoring Suppl (ONE TOUCH ULTRA 2) w/Device KIT Use as advised   . Cholecalciferol (VITAMIN D3) 50 MCG (2000 UT) capsule Take 2,000 Units by mouth daily.   . cyclobenzaprine (FLEXERIL) 10 MG tablet Take 10 mg by mouth 3 (three) times daily as needed for muscle spasms.   . furosemide (LASIX) 20 MG tablet Take 1 tablet (20 mg total) by mouth every morning.   . gabapentin (NEURONTIN) 300 MG capsule TAKE ONE CAPSULE BY MOUTH  THREE TIMES DAILY   . glucose blood (COOL BLOOD GLUCOSE TEST STRIPS) test strip Use to test blood sugar twice daily. DX: E11.9   . Insulin Pen Needle (NOVOFINE) 32G X 6 MM MISC 1 Act by Does not apply route daily.   . Lancets (ONETOUCH ULTRASOFT) lancets Use as instructed   . magnesium oxide (MAG-OX) 400 MG tablet TAKE ONE TABLET  BY MOUTH BY MOUTH TWICE DAILY WITH BREAKFAST AND EVENING MEAL]   . metoprolol succinate (TOPROL-XL) 100 MG 24 hr tablet Take 1 tablet (100 mg total) by mouth 2 (two) times daily. Take with or immediately following a meal.   . Multiple Vitamin (MULTIVITAMIN) tablet Take 1 tablet by mouth daily.   . nitroGLYCERIN (NITROSTAT) 0.4 MG SL tablet Place 1 tablet (0.4 mg total) under the tongue every 5 (five) minutes as needed for chest pain (x 3 doses). Reported on 05/08/2015   . ondansetron (ZOFRAN-ODT) 4 MG disintegrating tablet Take 1 tablet (4 mg total) by mouth every 8 (eight) hours as needed for nausea or vomiting.   Marland Kitchen PERCOCET 10-325 MG tablet Take 1 tablet by mouth 5 (five) times daily as needed.   . potassium chloride SA (KLOR-CON) 20 MEQ tablet Take 1 tablet (20 mEq total) by mouth 2 (two) times daily.   . Semaglutide, 1 MG/DOSE, (OZEMPIC, 1 MG/DOSE,) 2 MG/1.5ML SOPN Inject 2 mg into the skin once a week. (Patient not taking: Reported on 05/01/2020) 05/01/2020: 05/01/20: patient reports does not have; waiting to hear from PAP- message sent to [facility] Pharmacist  . spironolactone (ALDACTONE) 25 MG tablet Take 1 tablet (25 mg total) by mouth 2 (two) times daily. (Patient taking differently: Take 25 mg by mouth daily.)   . telmisartan (MICARDIS) 40 MG tablet TAKE ONE TABLET BY MOUTH DAILY   . thiamine 100 MG tablet Take 1 tablet (100 mg total) by mouth daily.   Marland Kitchen triamcinolone cream (KENALOG) 0.5 % Apply 1 application topically 3 (three) times daily.   Marland Kitchen warfarin (COUMADIN) 3 MG tablet TAKE ONE TABLET BY MOUTH ONCE DAILY OR USE AS DIRECTED by clinic    No facility-administered encounter medications on file as of 05/01/2020.    Patient Active Problem List   Diagnosis Date Noted  . Poor dentition requiring referral to dentistry 03/26/2020  . Thiamine deficiency 12/30/2018  . Vitamin B12 deficiency anemia due to intrinsic factor deficiency 12/20/2018  . CHF (congestive heart failure), NYHA class I,  chronic, combined ([place]) 12/19/2018  . Coronary artery disease involving native coronary artery of native heart with angina pectoris ([place]) 12/19/2018  . Obesity with body mass index (BMI) of 30.0 to 39.9 01/15/2018  . Chronic idiopathic constipation 12/27/2017  . Atrial fibrillation ([place]) 10/30/2017  . Type 2 diabetes mellitus ([place]) 10/30/2017  . Eczema 08/07/2017  . Insomnia secondary to chronic pain 08/04/2016  . Visit for screening mammogram 04/14/2016  . LV dysfunction   . Routine general medical examination at a health care facility 05/03/2013  . DM (diabetes mellitus), type 2 with peripheral vascular complications ([place]) 41/66/0630  . PVC's/nonsustained VT 10/24/2012  . H/O: CVA (cerebrovascular accident) 02/09/2012  . Hypothyroidism 05/27/2011  . Fibromyalgia 10/08/2010  . Long term current use of anticoagulant 05/05/2010  . Low back pain, non-specific 05/12/2008  . Cognitive dysfunction associated with depression 01/29/2007  . Osteoarthritis 11/22/2006  . Hyperlipidemia associated with type 2 diabetes mellitus ([place]) 09/13/2006  . Coronary atherosclerosis 09/13/2006  . Paroxysmal atrial fibrillation ([place]) 09/13/2006  . GERD 09/13/2006  .  Hypertension associated with diabetes ([facility]) 08/30/2006    Conditions to be addressed/monitored:CHF and DMII  Care Plan : Diabetes Type 2 (Adult)  Updates made by Michaela Corner, RN since 05/01/2020 12:00 AM    Problem: Disease Progression (Diabetes, Type 2)     Problem: Glycemic Management (Diabetes, Type 2)   Priority: High    Long-Range Goal: Glycemic Management Optimized   Start Date: 05/01/2020  Expected End Date: 10/29/2020  This Visit's Progress: Not on track  Priority: High  Note:   Objective:  Lab Results  Component Value Date   HGBA1C 8.0 (A) 03/26/2020 .   Lab Results  Component Value Date   CREATININE 0.90 10/16/2019   CREATININE 0.86 10/10/2019   CREATININE 0.86 04/11/2019 .   Marland Kitchen No results found for: EGFR Current  Barriers:  Marland Kitchen Knowledge Deficits related to basic Diabetes pathophysiology, self care/management, and appropriate diet for self-management of DM . Difficulty obtaining or cannot afford medications- confirmed CCM Pharmacist Mardella Layman currently active for PAP and medication optimization . Unable to independently verbalize appropriate choices for DM soft diet  . Does not adhere to provider recommendations re: following low carb/ sugar diet Case Manager Clinical Goal(s):  Over the next 6 months, patient will demonstrate improved adherence to prescribed treatment plan for diabetes self care/management as evidenced by:  . daily monitoring and recording of CBG   . adherence to ADA/ carb modified diet  . contacting provider for new or worsened symptoms or questions Interventions:  . Collaboration with Etta Grandchild, MD regarding development and update of comprehensive plan of care as evidenced by provider attestation and co-signature . Inter-disciplinary care team collaboration (see longitudinal plan of care) . Provided verbal and printed education to patient about basic DM diet . Reviewed medications related to DM with patient and discussed importance of medication adherence- confirmed CCM Pharmacist Mardella Layman currently active for PAP and medication optimization; placed message to [place] informing that patient reports still has not received follow up on PAP; assured patient that Mardella Layman is continuing to work on PAP for stated DM medications: Ozempic and Jardiance . Discussed plans with patient for ongoing care management follow up and provided patient with direct contact information for care management team . Advised patient, providing education and rationale, to continue checking blood sugars at home 1-2 times per day and to begin recording on paper for ongoing review; calling care providers for questions/ concerns.   . Review of patient status, including review of consultants reports, relevant laboratory  and other test results, and medications completed. Patient Goals: . Continue checking blood sugar twice daily and begin writing down these values from home monitoring . Check blood sugar if I feel it is too high or too low . Obtain a dedicated notebook or piece of paper to write down blood sugars so we can review these during our future phone calls  . Take the blood sugar log to all doctor visits- this will help the doctor make decisions about your treatment plan  . Contact your care providers if you have any questions or concerns about your blood sugar values at home or you medications . Read over the attached information about choices of food that are good to eat when you have diabetes, and begin making small changes to your soft diet to decrease you sugar and carbohydrate intake at home: we will talk about this during future phone calls Self-Care Activities . UNABLE to independently verbalize appropriate dietary choices for DM in setting of poor  dentition/ soft diet . Attends all scheduled provider appointments Follow Up Plan:  . Telephone follow up appointment with care management team member scheduled for: 05/25/20 at 3:45 pm . The patient has been provided with contact information for the care management team and has been advised to call with any health related questions or concerns.     Care Plan : Heart Failure (Adult)  Updates made by Knox Royalty, RN since 05/01/2020 12:00 AM    Problem: Symptom Exacerbation (Heart Failure)   Priority: Medium    Long-Range Goal: Symptom Exacerbation Prevented or Minimized   Start Date: 05/01/2020  Expected End Date: 10/29/2020  This Visit's Progress: On track  Priority: Medium  Note:   Current Barriers:  Marland Kitchen Knowledge deficit related to basic heart failure pathophysiology and self care management- needs ongoing reinforcement . Unable to independently verbalize comprehensive signs/ symptoms of HF exacerbation Case Manager Clinical Goal(s):  Over  the next 6 months patient will: . verbalize understanding of symptoms associated with Heart Failure and follow Action Plan and when to call doctor . Continue monitoring/ recording daily weight daily (notifying MD of 3 lb weight gain over night or 5 lb in a week) Interventions:  . Collaboration with [name], MD regarding development and update of comprehensive plan of care as evidenced by provider attestation and co-signature . Inter-disciplinary care team collaboration (see longitudinal plan of care) . Basic overview and discussion of pathophysiology of Heart Failure reviewed  . Provided verbal education on low sodium diet . Assessed need for readable accurate scales in home- patient has scales at home . Discussed importance of daily weight and advised patient to continue monitoring and recording daily weights Patient Goals/Self-Care Activities . Continue to weigh myself daily and track weight in diary or on a dedicated calendar: we will review your weights during our future phone calls . Call cardiologist/ care providers if I gain more than 2 pounds in one day or 5 pounds in one week . Use salt in moderation . Watch for swelling in feet, ankles and legs every day  Follow Up Plan:  . Telephone follow up appointment with care management team member scheduled for: 05/25/20 at 3:45 pm . The patient has been provided with contact information for the care management team and has been advised to call with any health related questions or concerns.       Plan:  Telephone follow up appointment with care management team member scheduled for:  05/25/20   The patient has been provided with contact information for the care management team and has been advised to call with any health related questions or concerns.   [name], RN, BSN, [facility] RN Care Coordination- [place] [phone]

## 2020-05-01 NOTE — Patient Instructions (Addendum)
Visit Information  It was nice talking with you today, Kristen [name]; please read over the information below and be thinking of ways you can incorporate some new foods that are recommended for people that have Diabetes into your soft diet.  We will talk about this and review your blood sugars and weights when I call you on May 25, 2020, at 3:45 pm.  Please be listening for my call and have your blood sugars and weights with you for [place] to review.  Take care!  [name], RN, BSN, [place] [phone]- direct [phone]- mobile   PATIENT GOALS: Goals Addressed            This Visit's Progress   . Monitor and Manage My Blood Sugar-Diabetes Type 2   Not on track    Timeframe:  Long-Range Goal Priority:  High Start Date:       05/01/20                      Expected End Date:   10/29/20                    Follow Up Date 05/25/20 at 3:45 pm   . Continue checking blood sugar twice daily and begin writing down these values from home monitoring . Check blood sugar if I feel it is too high or too low . Obtain a dedicated notebook or piece of paper to write down blood sugars so we can review these during our future phone calls  . Take the blood sugar log to all doctor visits- this will help the doctor make decisions about your treatment plan  . Contact your care providers if you have any questions or concerns about your blood sugar values at home or you medications . Read over the attached information about choices of food that are good to eat when you have diabetes, and begin making small changes to your soft diet to decrease you sugar and carbohydrate intake at home: we will talk about this during future phone calls   Why is this important?    Checking your blood sugar at home helps to keep it from getting very high or very low.   Writing the results in a diary or log helps the doctor know how to care for you.   Your blood  sugar log should have the time, date and the results.   Also, write down the amount of insulin or other medicine that you take.   Other information, like what you ate, exercise done and how you were feeling, will also be helpful.         . Track and Manage Fluids and Swelling-Heart Failure   On track    Timeframe:  Long-Range Goal Priority:  Medium Start Date:        05/01/20                     Expected End Date:   10/29/20                    Follow Up Date 05/25/20  . Continue to weigh myself daily and track weight in diary or on a dedicated calendar: we will review your weights during our future phone calls . Call cardiologist/ care providers if I gain more than 2 pounds in one day or 5 pounds in one week . Use salt in moderation .  Watch for swelling in feet, ankles and legs every day   Why is this important?    It is important to check your weight daily and watch how much salt and liquids you have.   It will help you to manage your heart failure.    Notes:        Diabetes Mellitus and Nutrition, Adult When you have diabetes, or diabetes mellitus, it is very important to have healthy eating habits because your blood sugar (glucose) levels are greatly affected by what you eat and drink. Eating healthy foods in the right amounts, at about the same times every day, can help you:  Control your blood glucose.  Lower your risk of heart disease.  Improve your blood pressure.  Reach or maintain a healthy weight. What can affect my meal plan? Every person with diabetes is different, and each person has different needs for a meal plan. Your health care provider may recommend that you work with a dietitian to make a meal plan that is best for you. Your meal plan may vary depending on factors such as:  The calories you need.  The medicines you take.  Your weight.  Your blood glucose, blood pressure, and cholesterol levels.  Your activity level.  Other health conditions you  have, such as heart or kidney disease. How do carbohydrates affect me? Carbohydrates, also called carbs, affect your blood glucose level more than any other type of food. Eating carbs naturally raises the amount of glucose in your blood. Carb counting is a method for keeping track of how many carbs you eat. Counting carbs is important to keep your blood glucose at a healthy level, especially if you use insulin or take certain oral diabetes medicines. It is important to know how many carbs you can safely have in each meal. This is different for every person. Your dietitian can help you calculate how many carbs you should have at each meal and for each snack. How does alcohol affect me? Alcohol can cause a sudden decrease in blood glucose (hypoglycemia), especially if you use insulin or take certain oral diabetes medicines. Hypoglycemia can be a life-threatening condition. Symptoms of hypoglycemia, such as sleepiness, dizziness, and confusion, are similar to symptoms of having too much alcohol.  Do not drink alcohol if: ? Your health care provider tells you not to drink. ? You are pregnant, may be pregnant, or are planning to become pregnant.  If you drink alcohol: ? Do not drink on an empty stomach. ? Limit how much you use to:  0-1 drink a day for women.  0-2 drinks a day for men. ? Be aware of how much alcohol is in your drink. In the [place] one drink equals one 12 oz bottle of beer (355 mL), one 5 oz glass of wine (148 mL), or one 1 oz glass of hard liquor (44 mL). ? Keep yourself hydrated with water, diet soda, or unsweetened iced tea.  Keep in mind that regular soda, juice, and other mixers may contain a lot of sugar and must be counted as carbs. What are tips for following this plan? Reading food labels  Start by checking the serving size on the "Nutrition Facts" label of packaged foods and drinks. The amount of calories, carbs, fats, and other nutrients listed on the label is based on  one serving of the item. Many items contain more than one serving per package.  Check the total grams (g) of carbs in one serving. You can  calculate the number of servings of carbs in one serving by dividing the total carbs by 15. For example, if a food has 30 g of total carbs per serving, it would be equal to 2 servings of carbs.  Check the number of grams (g) of saturated fats and trans fats in one serving. Choose foods that have a low amount or none of these fats.  Check the number of milligrams (mg) of salt (sodium) in one serving. Most people should limit total sodium intake to less than 2,300 mg per day.  Always check the nutrition information of foods labeled as "low-fat" or "nonfat." These foods may be higher in added sugar or refined carbs and should be avoided.  Talk to your dietitian to identify your daily goals for nutrients listed on the label. Shopping  Avoid buying canned, pre-made, or processed foods. These foods tend to be high in fat, sodium, and added sugar.  Shop around the outside edge of the grocery store. This is where you will most often find fresh fruits and vegetables, bulk grains, fresh meats, and fresh dairy. Cooking  Use low-heat cooking methods, such as baking, instead of high-heat cooking methods like deep frying.  Cook using healthy oils, such as olive, canola, or sunflower oil.  Avoid cooking with butter, cream, or high-fat meats. Meal planning  Eat meals and snacks regularly, preferably at the same times every day. Avoid going long periods of time without eating.  Eat foods that are high in fiber, such as fresh fruits, vegetables, beans, and whole grains. Talk with your dietitian about how many servings of carbs you can eat at each meal.  Eat 4-6 oz (112-168 g) of lean protein each day, such as lean meat, chicken, fish, eggs, or tofu. One ounce (oz) of lean protein is equal to: ? 1 oz (28 g) of meat, chicken, or fish. ? 1 egg. ?  cup (62 g) of  tofu.  Eat some foods each day that contain healthy fats, such as avocado, nuts, seeds, and fish.   What foods should I eat? Fruits Berries. Apples. Oranges. Peaches. Apricots. Plums. Grapes. Mango. Papaya. Pomegranate. Kiwi. Cherries. Vegetables Lettuce. Spinach. Leafy greens, including kale, chard, collard greens, and mustard greens. Beets. Cauliflower. Cabbage. Broccoli. Carrots. Green beans. Tomatoes. Peppers. Onions. Cucumbers. Brussels sprouts. Grains Whole grains, such as whole-wheat or whole-grain bread, crackers, tortillas, cereal, and pasta. Unsweetened oatmeal. Quinoa. Brown or wild rice. Meats and other proteins Seafood. Poultry without skin. Lean cuts of poultry and beef. Tofu. Nuts. Seeds. Dairy Low-fat or fat-free dairy products such as milk, yogurt, and cheese. The items listed above may not be a complete list of foods and beverages you can eat. Contact a dietitian for more information. What foods should I avoid? Fruits Fruits canned with syrup. Vegetables Canned vegetables. Frozen vegetables with butter or cream sauce. Grains Refined white flour and flour products such as bread, pasta, snack foods, and cereals. Avoid all processed foods. Meats and other proteins Fatty cuts of meat. Poultry with skin. Breaded or fried meats. Processed meat. Avoid saturated fats. Dairy Full-fat yogurt, cheese, or milk. Beverages Sweetened drinks, such as soda or iced tea. The items listed above may not be a complete list of foods and beverages you should avoid. Contact a dietitian for more information. Questions to ask a health care provider  Do I need to meet with a diabetes educator?  Do I need to meet with a dietitian?  What number can I call if I have  questions?  When are the best times to check my blood glucose? Where to find more information:  American Diabetes Association: diabetes.org  Academy of Nutrition and Dietetics: www.eatright.[employer] of  Diabetes and Digestive and Kidney Diseases: [employer]  Association of Diabetes Care and Education Specialists: www.diabeteseducator.org Summary  It is important to have healthy eating habits because your blood sugar (glucose) levels are greatly affected by what you eat and drink.  A healthy meal plan will help you control your blood glucose and maintain a healthy lifestyle.  Your health care provider may recommend that you work with a dietitian to make a meal plan that is best for you.  Keep in mind that carbohydrates (carbs) and alcohol have immediate effects on your blood glucose levels. It is important to count carbs and to use alcohol carefully. This information is not intended to replace advice given to you by your health care provider. Make sure you discuss any questions you have with your health care provider. Document Revised: 01/29/2019 Document Reviewed: 01/29/2019 Elsevier Patient Education  2021 [place].   Patient verbalizes understanding of instructions provided today and agrees to view in [place].    Telephone follow up appointment with care management team member scheduled for: 05/25/20 at 3:45 pm  The patient has been provided with contact information for the care management team and has been advised to call with any health related questions or concerns.   [name], RN, BSN, [facility] RN Care Coordination- [place] [phone]

## 2020-05-04 DIAGNOSIS — Z79891 Long term (current) use of opiate analgesic: Secondary | ICD-10-CM | POA: Diagnosis not present

## 2020-05-04 DIAGNOSIS — G894 Chronic pain syndrome: Secondary | ICD-10-CM | POA: Diagnosis not present

## 2020-05-04 DIAGNOSIS — M150 Primary generalized (osteo)arthritis: Secondary | ICD-10-CM | POA: Diagnosis not present

## 2020-05-04 DIAGNOSIS — M65312 Trigger thumb, left thumb: Secondary | ICD-10-CM | POA: Diagnosis not present

## 2020-05-04 NOTE — Progress Notes (Signed)
Spoke with patient this morning to let her know that [facility] cares approved her Kristen [name] but still waiting on approval from [employer].   [place] [phone]

## 2020-05-05 ENCOUNTER — Ambulatory Visit (INDEPENDENT_AMBULATORY_CARE_PROVIDER_SITE_OTHER): Payer: Medicare Other | Admitting: *Deleted

## 2020-05-05 DIAGNOSIS — G4701 Insomnia due to medical condition: Secondary | ICD-10-CM

## 2020-05-05 DIAGNOSIS — Z8673 Personal history of transient ischemic attack (TIA), and cerebral infarction without residual deficits: Secondary | ICD-10-CM

## 2020-05-05 DIAGNOSIS — F32A Depression, unspecified: Secondary | ICD-10-CM

## 2020-05-05 NOTE — Chronic Care Management (AMB) (Signed)
Chronic Care Management    Clinical Social Work Note  05/05/2020 Name: Kristen [name] MRN: [id number] DOB: 09-13-1945  Kristen [name] is a 75 y.o. year old female who is a primary care patient of Kristen Lima, MD. The CCM team was consulted to assist the patient with chronic disease management and/or care coordination needs related to: Mental Health Counseling and Resources.  Engaged with patient by telephone for follow up visit in response to provider referral for social work chronic care management and care coordination services.  Consent to Services:  The patient was given information about Chronic Care Management services, agreed to services, and gave verbal consent prior to initiation of services.  Please see initial visit note for detailed documentation.  Patient agreed to services and consent obtained.  Assessment: Review of patient past medical history, allergies, medications, and health status, including review of relevant consultants reports was performed today as part of a comprehensive evaluation and provision of chronic care management and care coordination services.    SDOH (Social Determinants of Health) assessments and interventions performed:   Advanced Directives Status: Not addressed in this encounter.  CCM Care Plan  Allergies  Allergen Reactions  . Metformin And Related Other (See Comments)    Must take XR form only, cannot tolerate Regular release metformin  . Cleocin [Clindamycin Hcl] Diarrhea  . Codeine Itching  . Doxycycline Hyclate Diarrhea  . Macrolides And Ketolides Diarrhea  . Morphine Hives  . Pentazocine Lactate Itching and Nausea And Vomiting    (GENERIC- [place])  . Vibramycin [Doxycycline Calcium] Diarrhea and Rash  . Clindamycin Diarrhea  . Definity [Perflutren Lipid Microsphere] Other (See Comments)    Patient complained of warm sensation in chest x few seconds duration when injected Definity.No other symptoms  . Doxycycline Diarrhea   . Metformin Other (See Comments)  . Pentazocine Nausea Only  . Perflutren Other (See Comments)  . Perflutren Lipid Microspheres Other (See Comments)  . Sulfa Antibiotics Diarrhea and Rash  . Sulfonamide Derivatives Rash    Outpatient Encounter Medications as of 05/05/2020  Medication Sig Note  . ALPRAZolam (XANAX) 1 MG tablet Take 1 tablet (1 mg total) by mouth 3 (three) times daily as needed for anxiety.   . Ascorbic Acid (VITAMIN C) 1000 MG tablet Take 1,000 mg by mouth daily.   Marland Kitchen atorvastatin (LIPITOR) 40 MG tablet TAKE ONE TABLET BY MOUTH DAILY   . Blood Glucose Monitoring Suppl (ONE TOUCH ULTRA 2) w/Device KIT Use as advised   . Cholecalciferol (VITAMIN D3) 50 MCG (2000 UT) capsule Take 2,000 Units by mouth daily.   . cyclobenzaprine (FLEXERIL) 10 MG tablet Take 10 mg by mouth 3 (three) times daily as needed for muscle spasms.   . empagliflozin (JARDIANCE) 25 MG TABS tablet Take 1 tablet (25 mg total) by mouth daily. 05/01/2020: 05/01/20: Patient reports she is about to run out; awaiting follow up from PAP; message sent to [facility]  . furosemide (LASIX) 20 MG tablet Take 1 tablet (20 mg total) by mouth every morning.   . gabapentin (NEURONTIN) 300 MG capsule TAKE ONE CAPSULE BY MOUTH THREE TIMES DAILY   . glucose blood (COOL BLOOD GLUCOSE TEST STRIPS) test strip Use to test blood sugar twice daily. DX: E11.9   . insulin glargine, 2 Unit Dial, (TOUJEO MAX SOLOSTAR) 300 UNIT/ML Solostar Pen Inject 50 Units into the skin daily.   . Insulin Pen Needle (NOVOFINE) 32G X 6 MM MISC 1 Act by Does not apply  route daily.   . Lancets (ONETOUCH ULTRASOFT) lancets Use as instructed   . magnesium oxide (MAG-OX) 400 MG tablet TAKE ONE TABLET BY MOUTH BY MOUTH TWICE DAILY WITH BREAKFAST AND EVENING MEAL]   . metFORMIN (GLUCOPHAGE-XR) 500 MG 24 hr tablet TAKE TWO TABLETS BY MOUTH TWICE DAILY WITH FOOD   . metoprolol succinate (TOPROL-XL) 100 MG 24 hr tablet Take 1 tablet (100 mg total) by  mouth 2 (two) times daily. Take with or immediately following a meal.   . Multiple Vitamin (MULTIVITAMIN) tablet Take 1 tablet by mouth daily.   . nitroGLYCERIN (NITROSTAT) 0.4 MG SL tablet Place 1 tablet (0.4 mg total) under the tongue every 5 (five) minutes as needed for chest pain (x 3 doses). Reported on 05/08/2015   . ondansetron (ZOFRAN-ODT) 4 MG disintegrating tablet Take 1 tablet (4 mg total) by mouth every 8 (eight) hours as needed for nausea or vomiting.   Marland Kitchen PERCOCET 10-325 MG tablet Take 1 tablet by mouth 5 (five) times daily as needed.   . potassium chloride SA (KLOR-CON) 20 MEQ tablet Take 1 tablet (20 mEq total) by mouth 2 (two) times daily.   . Semaglutide, 1 MG/DOSE, (OZEMPIC, 1 MG/DOSE,) 2 MG/1.5ML SOPN Inject 2 mg into the skin once a week. (Patient not taking: Reported on 05/01/2020) 05/01/2020: 05/01/20: patient reports does not have; waiting to hear from PAP- message sent to [facility] Pharmacist  . spironolactone (ALDACTONE) 25 MG tablet Take 1 tablet (25 mg total) by mouth 2 (two) times daily. (Patient taking differently: Take 25 mg by mouth daily.)   . telmisartan (MICARDIS) 40 MG tablet TAKE ONE TABLET BY MOUTH DAILY   . thiamine 100 MG tablet Take 1 tablet (100 mg total) by mouth daily.   Marland Kitchen triamcinolone cream (KENALOG) 0.5 % Apply 1 application topically 3 (three) times daily.   Marland Kitchen warfarin (COUMADIN) 3 MG tablet TAKE ONE TABLET BY MOUTH ONCE DAILY OR USE AS DIRECTED by clinic    No facility-administered encounter medications on file as of 05/05/2020.    Patient Active Problem List   Diagnosis Date Noted  . Poor dentition requiring referral to dentistry 03/26/2020  . Thiamine deficiency 12/30/2018  . Vitamin B12 deficiency anemia due to intrinsic factor deficiency 12/20/2018  . CHF (congestive heart failure), NYHA class I, chronic, combined ([place]) 12/19/2018  . Coronary artery disease involving native coronary artery of native heart with angina pectoris ([place]) 12/19/2018  .  Obesity with body mass index (BMI) of 30.0 to 39.9 01/15/2018  . Chronic idiopathic constipation 12/27/2017  . Atrial fibrillation ([place]) 10/30/2017  . Type 2 diabetes mellitus ([place]) 10/30/2017  . Eczema 08/07/2017  . Insomnia secondary to chronic pain 08/04/2016  . Visit for screening mammogram 04/14/2016  . LV dysfunction   . Routine general medical examination at a health care facility 05/03/2013  . DM (diabetes mellitus), type 2 with peripheral vascular complications ([place]) 66/44/0347  . PVC's/nonsustained VT 10/24/2012  . H/O: CVA (cerebrovascular accident) 02/09/2012  . Hypothyroidism 05/27/2011  . Fibromyalgia 10/08/2010  . Long term current use of anticoagulant 05/05/2010  . Low back pain, non-specific 05/12/2008  . Cognitive dysfunction associated with depression 01/29/2007  . Osteoarthritis 11/22/2006  . Hyperlipidemia associated with type 2 diabetes mellitus ([place]) 09/13/2006  . Coronary atherosclerosis 09/13/2006  . Paroxysmal atrial fibrillation ([place]) 09/13/2006  . GERD 09/13/2006  . Hypertension associated with diabetes ([place]) 08/30/2006    Conditions to be addressed/monitored: Anxiety and Depression; Mental Health Concerns   Care  Plan : LCSW Plan of Care  Updates made by Deirdre Peer, LCSW since 05/05/2020 12:00 AM    Problem: Lacks strategy for management of anxiety around sleeping and fear of dying.   Priority: High    Long-Range Goal: Establish and Maintain plan for anxiety management   Start Date: 04/09/2020  Expected End Date: 09/03/2020  This Visit's Progress: On track  Recent Progress: On track  Priority: High  Note:    Follow Up Plan: Appointment scheduled for SW follow up with client by phone on: 06/03/2020 Eduard Clos MSW, [place] Licensed Clinical Social Worker [place] [phone]

## 2020-05-05 NOTE — Patient Instructions (Signed)
Visit Information  Goals Addressed            This Visit's Progress   . Work on managing my worry and fear       Timeframe:  Long-Range Goal Priority:  High Start Date:    04/09/2020                         Expected End Date:  07/07/2020              Follow Up Date 06/03/2020   - speak with Dr Casimiro Needle about recent thoughts/feelings and consider connecting with a new therapist/counselor - practice relaxation or meditation daily - reach out to CSW and/or Dr Casimiro Needle if needs arise and want to pursue counseling appointment before scheduled visit with Dr Casimiro Needle 07/28/2020   Why is this important?    When you are stressed, down or upset, your body reacts too.   For example, your blood pressure may get higher; you may have a headache or stomachache.   When your emotions get the best of you, your body's ability to fight off cold and flu gets weak.   These steps will help you manage your emotions.    Patient verbalizes understanding of instructions provided today and agrees to view in [place].   Telephone follow up appointment with care management team member scheduled for:06/03/2020  Eduard Clos MSW, LCSW Licensed Clinical Social Worker [place] [phone]

## 2020-05-06 ENCOUNTER — Telehealth: Payer: Self-pay | Admitting: Pharmacist

## 2020-05-06 NOTE — Progress Notes (Signed)
A call was made to [facility] to check on the status of [name]'s patient assistance application. Spoke with representative Caesar Bookman) and told her that a fax was sent twice on 04/22/20 and again on 04/29/20. She states that the application had some missing information, ss#, proof of income, and the dose needs to be changed.   Wendy Poet, [place] [phone]   Time spent:49

## 2020-05-08 NOTE — Telephone Encounter (Signed)
Re-faxed [employer] (Ozempic) application as requested.  Ensure that packet included SSN, proof of income, and dosing clarification.  Will follow up in 3-5 business days.

## 2020-05-11 ENCOUNTER — Telehealth: Payer: Medicare Other

## 2020-05-12 ENCOUNTER — Telehealth: Payer: Self-pay | Admitting: Pharmacist

## 2020-05-12 NOTE — Progress Notes (Signed)
Chronic Care Management Pharmacy Assistant   Name: Lafern Brinkley  MRN: [id number] DOB: 1946-02-12   Reason for Encounter: PAP   Medications: Outpatient Encounter Medications as of 05/12/2020  Medication Sig Note  . ALPRAZolam (XANAX) 1 MG tablet Take 1 tablet (1 mg total) by mouth 3 (three) times daily as needed for anxiety.   . Ascorbic Acid (VITAMIN C) 1000 MG tablet Take 1,000 mg by mouth daily.   Marland Kitchen atorvastatin (LIPITOR) 40 MG tablet TAKE ONE TABLET BY MOUTH DAILY   . Blood Glucose Monitoring Suppl (ONE TOUCH ULTRA 2) w/Device KIT Use as advised   . Cholecalciferol (VITAMIN D3) 50 MCG (2000 UT) capsule Take 2,000 Units by mouth daily.   . cyclobenzaprine (FLEXERIL) 10 MG tablet Take 10 mg by mouth 3 (three) times daily as needed for muscle spasms.   . empagliflozin (JARDIANCE) 25 MG TABS tablet Take 1 tablet (25 mg total) by mouth daily. 05/01/2020: 05/01/20: Patient reports she is about to run out; awaiting follow up from PAP; message sent to [facility]  . furosemide (LASIX) 20 MG tablet Take 1 tablet (20 mg total) by mouth every morning.   . gabapentin (NEURONTIN) 300 MG capsule TAKE ONE CAPSULE BY MOUTH THREE TIMES DAILY   . glucose blood (COOL BLOOD GLUCOSE TEST STRIPS) test strip Use to test blood sugar twice daily. DX: E11.9   . insulin glargine, 2 Unit Dial, (TOUJEO MAX SOLOSTAR) 300 UNIT/ML Solostar Pen Inject 50 Units into the skin daily.   . Insulin Pen Needle (NOVOFINE) 32G X 6 MM MISC 1 Act by Does not apply route daily.   . Lancets (ONETOUCH ULTRASOFT) lancets Use as instructed   . magnesium oxide (MAG-OX) 400 MG tablet TAKE ONE TABLET BY MOUTH BY MOUTH TWICE DAILY WITH BREAKFAST AND EVENING MEAL]   . metFORMIN (GLUCOPHAGE-XR) 500 MG 24 hr tablet TAKE TWO TABLETS BY MOUTH TWICE DAILY WITH FOOD   . metoprolol succinate (TOPROL-XL) 100 MG 24 hr tablet Take 1 tablet (100 mg total) by mouth 2 (two) times daily. Take with or immediately following a meal.   .  Multiple Vitamin (MULTIVITAMIN) tablet Take 1 tablet by mouth daily.   . nitroGLYCERIN (NITROSTAT) 0.4 MG SL tablet Place 1 tablet (0.4 mg total) under the tongue every 5 (five) minutes as needed for chest pain (x 3 doses). Reported on 05/08/2015   . ondansetron (ZOFRAN-ODT) 4 MG disintegrating tablet Take 1 tablet (4 mg total) by mouth every 8 (eight) hours as needed for nausea or vomiting.   Marland Kitchen PERCOCET 10-325 MG tablet Take 1 tablet by mouth 5 (five) times daily as needed.   . potassium chloride SA (KLOR-CON) 20 MEQ tablet Take 1 tablet (20 mEq total) by mouth 2 (two) times daily.   . Semaglutide, 1 MG/DOSE, (OZEMPIC, 1 MG/DOSE,) 2 MG/1.5ML SOPN Inject 2 mg into the skin once a week. (Patient not taking: Reported on 05/01/2020) 05/01/2020: 05/01/20: patient reports does not have; waiting to hear from PAP- message sent to [facility] Pharmacist  . spironolactone (ALDACTONE) 25 MG tablet Take 1 tablet (25 mg total) by mouth 2 (two) times daily. (Patient taking differently: Take 25 mg by mouth daily.)   . telmisartan (MICARDIS) 40 MG tablet TAKE ONE TABLET BY MOUTH DAILY   . thiamine 100 MG tablet Take 1 tablet (100 mg total) by mouth daily.   Marland Kitchen triamcinolone cream (KENALOG) 0.5 % Apply 1 application topically 3 (three) times daily.   Marland Kitchen warfarin (COUMADIN)  3 MG tablet TAKE ONE TABLET BY MOUTH ONCE DAILY OR USE AS DIRECTED by clinic    No facility-administered encounter medications on file as of 05/12/2020.    A call was made to [facility] to check on the status of Ms. Dick's patient assistance application for Ozempic. I spoke with the representative (Cassandra) who stated that the [place] number was missing and also the needs to be for 120 day not 90. The representative verified the license number and process the patient application. The application has been approved through 03/06/21.    Wendy Poet, [place] [phone]   Time spent:30

## 2020-05-13 ENCOUNTER — Ambulatory Visit: Payer: Medicare Other | Admitting: Cardiovascular Disease

## 2020-05-13 ENCOUNTER — Other Ambulatory Visit: Payer: Self-pay

## 2020-05-13 ENCOUNTER — Encounter: Payer: Self-pay | Admitting: Cardiovascular Disease

## 2020-05-13 VITALS — BP 110/72 | HR 96 | Ht 61.0 in | Wt 208.4 lb

## 2020-05-13 DIAGNOSIS — I480 Paroxysmal atrial fibrillation: Secondary | ICD-10-CM

## 2020-05-13 DIAGNOSIS — I25119 Atherosclerotic heart disease of native coronary artery with unspecified angina pectoris: Secondary | ICD-10-CM | POA: Diagnosis not present

## 2020-05-13 DIAGNOSIS — I10 Essential (primary) hypertension: Secondary | ICD-10-CM | POA: Diagnosis not present

## 2020-05-13 DIAGNOSIS — E782 Mixed hyperlipidemia: Secondary | ICD-10-CM | POA: Diagnosis not present

## 2020-05-13 NOTE — Progress Notes (Signed)
Cardiology Office Note:    Date:  05/15/2020   ID:  [name], DOB [birth year], MRN [id number]  PCP:  [name], [place]  Cardiologist:  Sherren Mocha, MD  Advanced Practice Provider:  No care team member to display Electrophysiologist:  None    { Click here to update then REFRESH NOTE - MD (PCP) or APP (Team Member)  Change PCP Type for MD, Specialty for APP is either Cardiology or Clinical Cardiac Electrophysiology  :[id number]}   Referring MD: [name], MD   Chief Complaint  Patient presents with  . Coronary Artery Disease    History of Present Illness:    Kristen [name] is a 75 y.o. female with a hx of hx of coronary artery disease status post CABG in 2004. Other medical problems include paroxysmal atrial fibrillationon warfarin, history of stroke, obesity, diabetes, hypertension, and left bundle branch block.  The patient is here alone today.  She has been doing well of late.  She did have another episode of chest burning and shortness of breath when walking in the cold weather.  Her symptoms were significant and caused her to stop and rest.  Symptoms resolved after a few minutes of rest.  She has had no recurrence.  She had a similar episode last winter but no interval symptoms.  She otherwise has been doing fairly well with no recent heart palpitations, lightheadedness, leg swelling, orthopnea, or PND.  Past Medical History:  Diagnosis Date  . Arthritis   . Blood transfusion without reported diagnosis   . Bursitis   . CAD (coronary artery disease)    a. s/p CABG 2004.b. stable cath 2014 demonstrating stable CAD and continued patency of her LIMA graft.  . Chronic anticoagulation    on coumadin  . DDD (degenerative disc disease), lumbar   . Depression   . Diabetes mellitus   . Diastolic dysfunction    per echo in October 2012 with EF 50 to 55%  . Fibromyalgia   . GERD (gastroesophageal reflux disease)  10/23/2003  . Headache(784.0)   . Hyperlipidemia   . Hypertension   . Hypokalemia   . LBBB (left bundle branch block)   . Lumbar back pain   . LV dysfunction    a. EF 45% by cath 2014. b. EF 50-55% by technically difficult echo in 08/2014.  Marland Kitchen Lymphadenitis   . Morbid obesity ([place])    a. Sleep study negative for significant OSA in 11/2014.  Marland Kitchen PAF (paroxysmal atrial fibrillation) ([place])   . Stroke [facility]) 2004   affected speech per pt    Past Surgical History:  Procedure Laterality Date  . ABDOMINAL HYSTERECTOMY    . ANGIOPLASTY  laminectomy  . CHOLECYSTECTOMY    . CORONARY ARTERY BYPASS GRAFT     LIMA to LAD   . KNEE ARTHROSCOPY    . LEFT HEART CATHETERIZATION WITH CORONARY/GRAFT ANGIOGRAM  12/07/2012   Procedure: LEFT HEART CATHETERIZATION WITH Beatrix Fetters;  Surgeon: Blane Ohara, MD;  Location: [facility] CATH LAB;  Service: Cardiovascular;;  . LUMBAR LAMINECTOMY     x3  . SPHINCTEROTOMY    . TONSILLECTOMY      Current Medications: Current Meds  Medication Sig  . ALPRAZolam (XANAX) 1 MG tablet Take 1 tablet (1 mg total) by mouth 3 (three) times daily as needed for anxiety.  . Ascorbic Acid (VITAMIN C) 1000 MG tablet Take 1,000 mg by mouth daily.  Marland Kitchen atorvastatin (LIPITOR)  40 MG tablet TAKE ONE TABLET BY MOUTH DAILY  . Blood Glucose Monitoring Suppl (ONE TOUCH ULTRA 2) w/Device KIT Use as advised  . Cholecalciferol (VITAMIN D3) 50 MCG (2000 UT) capsule Take 2,000 Units by mouth daily.  . cyclobenzaprine (FLEXERIL) 10 MG tablet Take 10 mg by mouth 3 (three) times daily as needed for muscle spasms.  . empagliflozin (JARDIANCE) 25 MG TABS tablet Take 1 tablet (25 mg total) by mouth daily.  . furosemide (LASIX) 20 MG tablet Take 1 tablet (20 mg total) by mouth every morning.  . gabapentin (NEURONTIN) 300 MG capsule TAKE ONE CAPSULE BY MOUTH THREE TIMES DAILY  . glucose blood (COOL BLOOD GLUCOSE TEST STRIPS) test strip Use to test blood sugar twice daily. DX: E11.9  .  insulin glargine, 2 Unit Dial, (TOUJEO MAX SOLOSTAR) 300 UNIT/ML Solostar Pen Inject 50 Units into the skin daily.  . Insulin Pen Needle (NOVOFINE) 32G X 6 MM MISC 1 Act by Does not apply route daily.  . Lancets (ONETOUCH ULTRASOFT) lancets Use as instructed  . magnesium oxide (MAG-OX) 400 MG tablet TAKE ONE TABLET BY MOUTH BY MOUTH TWICE DAILY WITH BREAKFAST AND EVENING MEAL]  . metFORMIN (GLUCOPHAGE-XR) 500 MG 24 hr tablet TAKE TWO TABLETS BY MOUTH TWICE DAILY WITH FOOD  . metoprolol succinate (TOPROL-XL) 100 MG 24 hr tablet Take 1 tablet (100 mg total) by mouth 2 (two) times daily. Take with or immediately following a meal.  . Multiple Vitamin (MULTIVITAMIN) tablet Take 1 tablet by mouth daily.  . nitroGLYCERIN (NITROSTAT) 0.4 MG SL tablet Place 1 tablet (0.4 mg total) under the tongue every 5 (five) minutes as needed for chest pain (x 3 doses). Reported on 05/08/2015  . ondansetron (ZOFRAN-ODT) 4 MG disintegrating tablet Take 1 tablet (4 mg total) by mouth every 8 (eight) hours as needed for nausea or vomiting.  Marland Kitchen PERCOCET 10-325 MG tablet Take 1 tablet by mouth 5 (five) times daily as needed.  . potassium chloride SA (KLOR-CON) 20 MEQ tablet Take 1 tablet (20 mEq total) by mouth 2 (two) times daily.  . Semaglutide, 1 MG/DOSE, (OZEMPIC, 1 MG/DOSE,) 2 MG/1.5ML SOPN Inject 2 mg into the skin once a week.  . spironolactone (ALDACTONE) 25 MG tablet Take 1 tablet (25 mg total) by mouth 2 (two) times daily.  Marland Kitchen telmisartan (MICARDIS) 40 MG tablet TAKE ONE TABLET BY MOUTH DAILY  . thiamine 100 MG tablet Take 1 tablet (100 mg total) by mouth daily.  Marland Kitchen triamcinolone cream (KENALOG) 0.5 % Apply 1 application topically 3 (three) times daily.  Marland Kitchen warfarin (COUMADIN) 3 MG tablet TAKE ONE TABLET BY MOUTH ONCE DAILY OR USE AS DIRECTED by clinic     Allergies:   Metformin and related, Cleocin [clindamycin hcl], Codeine, Doxycycline hyclate, Macrolides and ketolides, Morphine, Pentazocine lactate, Vibramycin  [doxycycline calcium], Clindamycin, Definity [perflutren lipid microsphere], Doxycycline, Metformin, Pentazocine, Perflutren, Perflutren lipid microspheres, Sulfa antibiotics, and Sulfonamide derivatives   Social History   Socioeconomic History  . Marital status: Married    Spouse name: Not on file  . Number of children: Not on file  . Years of education: Not on file  . Highest education level: Not on file  Occupational History  . Not on file  Tobacco Use  . Smoking status: Former Smoker    Quit date: 03/07/2001    Years since quitting: 19.2  . Smokeless tobacco: Never Used  Vaping Use  . Vaping Use: Never used  Substance and Sexual Activity  . Alcohol  use: No  . Drug use: No  . Sexual activity: Not Currently  Other Topics Concern  . Not on file  Social History Narrative   Lives locally, has help available if needed.   Social Determinants of Health   Financial Resource Strain: High Risk  . Difficulty of Paying Living Expenses: Hard  Food Insecurity: No Food Insecurity  . Worried About Programme researcher, broadcasting/film/video in the Last Year: Never true  . Ran Out of Food in the Last Year: Never true  Transportation Needs: No Transportation Needs  . Lack of Transportation (Medical): No  . Lack of Transportation (Non-Medical): No  Physical Activity: Inactive  . Days of Exercise per Week: 0 days  . Minutes of Exercise per Session: 0 min  Stress: Stress Concern Present  . Feeling of Stress : To some extent  Social Connections: Moderately Isolated  . Frequency of Communication with Friends and Family: More than three times a week  . Frequency of Social Gatherings with Friends and Family: Never  . Attends Religious Services: Never  . Active Member of Clubs or Organizations: No  . Attends Banker Meetings: Never  . Marital Status: Married     Family History: The patient's family history includes Anxiety disorder in her maternal aunt; Arthritis in an other family member; Colon  cancer in her paternal uncle; Depression in her sister; Diabetes in an other family member; Heart attack in her father; Heart disease in her father; Hypertension in an other family member; Kidney disease in an other family member; Stroke in her mother and another family member.  ROS:   Please see the history of present illness.    BL knee pain and leg weakness. All other systems reviewed and are negative.  EKGs/Labs/Other Studies Reviewed:    EKG:  EKG is not ordered today.  Recent Labs: 10/10/2019: Hemoglobin 12.2; Platelets 308; TSH 1.55 10/16/2019: BUN 18; Creat 0.90; Potassium 4.3; Sodium 139  Recent Lipid Panel    Component Value Date/Time   CHOL 126 04/11/2019 1455   CHOL 108 12/26/2017 0758   TRIG 123.0 04/11/2019 1455   HDL 36.70 (L) 04/11/2019 1455   HDL 38 (L) 12/26/2017 0758   CHOLHDL 3 04/11/2019 1455   VLDL 24.6 04/11/2019 1455   LDLCALC 65 04/11/2019 1455   LDLCALC 48 12/26/2017 0758   LDLDIRECT 116.0 11/28/2008 1122     Risk Assessment/Calculations:    CHA2DS2-VASc Score = 8  This indicates a 10.8% annual risk of stroke. The patient's score is based upon: CHF History: Yes HTN History: Yes Diabetes History: Yes Stroke History: Yes Vascular Disease History: Yes      Physical Exam:    VS:  BP 110/72   Pulse 96   Ht 5\' 1"  (1.549 m)   Wt 208 lb 6.4 oz (94.5 kg)   SpO2 97%   BMI 39.38 kg/m     Wt Readings from Last 3 Encounters:  05/13/20 208 lb 6.4 oz (94.5 kg)  03/26/20 212 lb (96.2 kg)  11/19/19 210 lb (95.3 kg)     GEN:  Well nourished, well developed in no acute distress HEENT: Normal NECK: No JVD; No carotid bruits LYMPHATICS: No lymphadenopathy CARDIAC: RRR, no murmurs, rubs, gallops RESPIRATORY:  Clear to auscultation without rales, wheezing or rhonchi  ABDOMEN: Soft, non-tender, non-distended MUSCULOSKELETAL:  No edema; No deformity  SKIN: Warm and dry NEUROLOGIC:  Alert and oriented x 3 PSYCHIATRIC:  Normal affect   ASSESSMENT:     1. Coronary  artery disease involving native coronary artery of native heart with angina pectoris ([place])   2. PAF (paroxysmal atrial fibrillation) ([place])   3. Essential hypertension   4. Mixed hyperlipidemia    PLAN:    In order of problems listed above:  1. Currently stable on atorvastatin, warfarin, metoprolol succinate.  Another episode of angina and cool weather noted.  There has been a year since her last episode.  She will keep an eye on this and understands to contact us if any progressive symptoms. 2. Stable, maintaining sinus rhythm, anticoagulated with warfarin. 3. Continues to demonstrate excellent control of her blood pressure on a combination of telmisartan, metoprolol succinate, and spironolactone. 4. Lipids are at goal on atorvastatin 40 mg daily.  LDL cholesterol is less than 70 mg/dL.        Medication Adjustments/Labs and Tests Ordered: Current medicines are reviewed at length with the patient today.  Concerns regarding medicines are outlined above.  No orders of the defined types were placed in this encounter.  No orders of the defined types were placed in this encounter.   Patient Instructions  Medication Instructions:  Your provider recommends that you continue on your current medications as directed. Please refer to the Current Medication list given to you today.   *If you need a refill on your cardiac medications before your next appointment, please call your pharmacy*  Follow-Up: At [facility], you and your health needs are our priority.  As part of our continuing mission to provide you with exceptional heart care, we have created designated Provider Care Teams.  These Care Teams include your primary Cardiologist (physician) and Advanced Practice Providers (APPs -  Physician Assistants and Nurse Practitioners) who all work together to provide you with the care you need, when you need it. Your next appointment:   8 month(s) The format for your next  appointment:   In Person Provider:   You may see Sherren Mocha, MD or one of the following Advanced Practice Providers on your designated Care Team:    Richardson Dopp, PA-C  Robbie Lis, [place]      Signed, Sherren Mocha, MD  05/15/2020 5:53 AM    [place]

## 2020-05-13 NOTE — Patient Instructions (Signed)
Medication Instructions:  Your provider recommends that you continue on your current medications as directed. Please refer to the Current Medication list given to you today.   *If you need a refill on your cardiac medications before your next appointment, please call your pharmacy*  Follow-Up: At [facility], you and your health needs are our priority.  As part of our continuing mission to provide you with exceptional heart care, we have created designated Provider Care Teams.  These Care Teams include your primary Cardiologist (physician) and Advanced Practice Providers (APPs -  Physician Assistants and Nurse Practitioners) who all work together to provide you with the care you need, when you need it. Your next appointment:   8 month(s) The format for your next appointment:   In Person Provider:   You may see Sherren Mocha, MD or one of the following Advanced Practice Providers on your designated Care Team:    Richardson Dopp, PA-C  [place], [place]

## 2020-05-15 ENCOUNTER — Encounter: Payer: Self-pay | Admitting: Cardiovascular Disease

## 2020-05-18 ENCOUNTER — Telehealth: Payer: Self-pay | Admitting: Pharmacist

## 2020-05-18 NOTE — Progress Notes (Signed)
  Chronic Care Management Pharmacy Assistant   Name: Kristen [name]  MRN: [id number] DOB: 10/07/1945   Reason for Encounter: Medication Review   Medications: Outpatient Encounter Medications as of 05/18/2020  Medication Sig  . ALPRAZolam (XANAX) 1 MG tablet Take 1 tablet (1 mg total) by mouth 3 (three) times daily as needed for anxiety.  . Ascorbic Acid (VITAMIN C) 1000 MG tablet Take 1,000 mg by mouth daily.  . atorvastatin (LIPITOR) 40 MG tablet TAKE ONE TABLET BY MOUTH DAILY  . Blood Glucose Monitoring Suppl (ONE TOUCH ULTRA 2) w/Device KIT Use as advised  . Cholecalciferol (VITAMIN D3) 50 MCG (2000 UT) capsule Take 2,000 Units by mouth daily.  . cyclobenzaprine (FLEXERIL) 10 MG tablet Take 10 mg by mouth 3 (three) times daily as needed for muscle spasms.  . empagliflozin (JARDIANCE) 25 MG TABS tablet Take 1 tablet (25 mg total) by mouth daily.  . furosemide (LASIX) 20 MG tablet Take 1 tablet (20 mg total) by mouth every morning.  . gabapentin (NEURONTIN) 300 MG capsule TAKE ONE CAPSULE BY MOUTH THREE TIMES DAILY  . glucose blood (COOL BLOOD GLUCOSE TEST STRIPS) test strip Use to test blood sugar twice daily. DX: E11.9  . insulin glargine, 2 Unit Dial, (TOUJEO MAX SOLOSTAR) 300 UNIT/ML Solostar Pen Inject 50 Units into the skin daily.  . Insulin Pen Needle (NOVOFINE) 32G X 6 MM MISC 1 Act by Does not apply route daily.  . Lancets (ONETOUCH ULTRASOFT) lancets Use as instructed  . magnesium oxide (MAG-OX) 400 MG tablet TAKE ONE TABLET BY MOUTH BY MOUTH TWICE DAILY WITH BREAKFAST AND EVENING MEAL]  . metFORMIN (GLUCOPHAGE-XR) 500 MG 24 hr tablet TAKE TWO TABLETS BY MOUTH TWICE DAILY WITH FOOD  . metoprolol succinate (TOPROL-XL) 100 MG 24 hr tablet Take 1 tablet (100 mg total) by mouth 2 (two) times daily. Take with or immediately following a meal.  . Multiple Vitamin (MULTIVITAMIN) tablet Take 1 tablet by mouth daily.  . nitroGLYCERIN (NITROSTAT) 0.4 MG SL tablet Place 1 tablet  (0.4 mg total) under the tongue every 5 (five) minutes as needed for chest pain (x 3 doses). Reported on 05/08/2015  . ondansetron (ZOFRAN-ODT) 4 MG disintegrating tablet Take 1 tablet (4 mg total) by mouth every 8 (eight) hours as needed for nausea or vomiting.  . PERCOCET 10-325 MG tablet Take 1 tablet by mouth 5 (five) times daily as needed.  . potassium chloride SA (KLOR-CON) 20 MEQ tablet Take 1 tablet (20 mEq total) by mouth 2 (two) times daily.  . Semaglutide, 1 MG/DOSE, (OZEMPIC, 1 MG/DOSE,) 2 MG/1.5ML SOPN Inject 2 mg into the skin once a week.  . spironolactone (ALDACTONE) 25 MG tablet Take 1 tablet (25 mg total) by mouth 2 (two) times daily.  . telmisartan (MICARDIS) 40 MG tablet TAKE ONE TABLET BY MOUTH DAILY  . thiamine 100 MG tablet Take 1 tablet (100 mg total) by mouth daily.  . triamcinolone cream (KENALOG) 0.5 % Apply 1 application topically 3 (three) times daily.  . warfarin (COUMADIN) 3 MG tablet TAKE ONE TABLET BY MOUTH ONCE DAILY OR USE AS DIRECTED by clinic   No facility-administered encounter medications on file as of 05/18/2020.    Reviewed chart for medication changes ahead of medication coordination call.  No OVs, Consults, or hospital visits since last care coordination call/Pharmacist visit. (If appropriate, list visit date, provider name)  No medication changes indicated OR if recent visit, treatment plan here.  BP Readings from Last 3   Encounters:  05/13/20 110/72  03/26/20 138/70  11/19/19 104/66    Lab Results  Component Value Date   HGBA1C 8.0 (A) 03/26/2020     Patient obtains medications through Vials  90 Days   Last adherence delivery included:  Percocet  10 mg - 325 mg Take 1 tab 5 times daily as needed for pain Warfarin 3 mg Take one tab by mouth as directed by clinic   Patient is due for next adherence delivery on: 05-27-20 Called patient and reviewed medications and coordinated delivery.  This delivery to include: Alprazolam 1 mg  Take  three tabs daily for anxiety   Confirmed delivery date of 05-27-20, advised patient that pharmacy will contact them the morning of delivery.  Patient denied medications:  Jardiance 25 mg Take one tab daily - Patient receives medications through patient assistance. One touch ultra blue test strips  Jomayra Heredia ,CMA Clinical Pharmacist Assistant [phone]   Time spent : 35 Minutes  

## 2020-05-25 ENCOUNTER — Ambulatory Visit: Payer: Medicare Other | Admitting: *Deleted

## 2020-05-25 DIAGNOSIS — E1159 Type 2 diabetes mellitus with other circulatory complications: Secondary | ICD-10-CM | POA: Diagnosis not present

## 2020-05-25 DIAGNOSIS — F32A Depression, unspecified: Secondary | ICD-10-CM | POA: Diagnosis not present

## 2020-05-25 DIAGNOSIS — E1151 Type 2 diabetes mellitus with diabetic peripheral angiopathy without gangrene: Secondary | ICD-10-CM | POA: Diagnosis not present

## 2020-05-25 DIAGNOSIS — I152 Hypertension secondary to endocrine disorders: Secondary | ICD-10-CM | POA: Diagnosis not present

## 2020-05-25 DIAGNOSIS — I5042 Chronic combined systolic (congestive) and diastolic (congestive) heart failure: Secondary | ICD-10-CM | POA: Diagnosis not present

## 2020-05-25 NOTE — Patient Instructions (Addendum)
Visit Information  Fraser Din, it was nice talking with you today.   Please read over the attached information, and start making changes to your diet to incorporate low-carb choices into your soft diet   As you know, I have asked the Care Guide to contact you to discuss financial resources available to you, including low-cost dental provider options- please listen out for the call form the care guide  I encourage you to schedule a diabetic eye exam promptly so you do not get behind with your diabetic maintenance care   I look forward to talking to you again for an update on Wednesday June 24, 2020 at 3:30 pm- please be listening out for my call that day.  I will call as close to 3:30 pm as possible; I look forward to hearing about your progress.  We will review your blood sugars and weights at home and talk about dietary strategies to help you stay on track with your diabetes management   Please don't hesitate to contact me if I can be of assistance to you before our next scheduled appointment.   [name], RN, BSN, [facility] RN Care Coordination- [place] [phone]: direct office [phone]: mobile    PATIENT GOALS: Goals Addressed            This Visit's Progress   . Monitor and Manage My Blood Sugar-Diabetes Type 2   On track    Timeframe:  Long-Range Goal Priority:  High Start Date:       05/01/20                      Expected End Date:   10/29/20                    Follow Up Date 06/24/20 at 3:30 pm   . Continue checking blood sugar twice daily and begin writing down these values from home monitoring: check first thing in the morning before you eat and again 2 hours after a large meal . Check blood sugar if I feel it is too high or too low . Obtain a dedicated notebook or piece of paper to write down blood sugars so we can review these during our future phone calls  . Take the blood sugar log to all doctor visits- this will help the doctor  make decisions about your treatment plan and medications . If you have any questions about the status of your medication assistance programs, please reach out to the pharmacist that is helping you with this . I have asked a care guide to contact you about financial resources, including affordable dental providers . Contact your care providers if you have any questions or concerns about your blood sugar values at home or you medications . Read over the attached information about choices of food that are good to eat when you have diabetes, and begin making small changes to your soft diet to decrease you sugar and carbohydrate intake at home: we will talk about this during future phone calls   Why is this important?    Checking your blood sugar at home helps to keep it from getting very high or very low.   Writing the results in a diary or log helps the doctor know how to care for you.   Your blood sugar log should have the time, date and the results.   Also, write down the amount of insulin or other medicine  that you take.   Other information, like what you ate, exercise done and how you were feeling, will also be helpful.         . Track and Manage Fluids and Swelling-Heart Failure   On track    Timeframe:  Long-Range Goal Priority:  Medium Start Date:        05/01/20                     Expected End Date:   10/29/20                    Follow Up Date 06/24/20 at 3:30 pm  . Continue to weigh myself daily and track weight in diary or on a dedicated calendar: we will review your weights during our future phone calls . Call cardiologist/ care providers if I gain more than 2 pounds in one day or 5 pounds in one week . Use salt in moderation . Watch for swelling in feet, ankles and legs every day . I am glad that you had a good visit with your cardiologist on May 13, 2020- please keep your cardiologist updated if you develop concerns or have changes in your clinical status . Today, you  reported weights at home consistently at 208   Why is this important?    It is important to check your weight daily and watch how much salt and liquids you have.   It will help you to manage your heart failure.    Notes:        Diabetes Mellitus and Nutrition, Adult When you have diabetes, or diabetes mellitus, it is very important to have healthy eating habits because your blood sugar (glucose) levels are greatly affected by what you eat and drink. Eating healthy foods in the right amounts, at about the same times every day, can help you:  Control your blood glucose.  Lower your risk of heart disease.  Improve your blood pressure.  Reach or maintain a healthy weight. What can affect my meal plan? Every person with diabetes is different, and each person has different needs for a meal plan. Your health care provider may recommend that you work with a dietitian to make a meal plan that is best for you. Your meal plan may vary depending on factors such as:  The calories you need.  The medicines you take.  Your weight.  Your blood glucose, blood pressure, and cholesterol levels.  Your activity level.  Other health conditions you have, such as heart or kidney disease. How do carbohydrates affect me? Carbohydrates, also called carbs, affect your blood glucose level more than any other type of food. Eating carbs naturally raises the amount of glucose in your blood. Carb counting is a method for keeping track of how many carbs you eat. Counting carbs is important to keep your blood glucose at a healthy level, especially if you use insulin or take certain oral diabetes medicines. It is important to know how many carbs you can safely have in each meal. This is different for every person. Your dietitian can help you calculate how many carbs you should have at each meal and for each snack. How does alcohol affect me? Alcohol can cause a sudden decrease in blood glucose (hypoglycemia),  especially if you use insulin or take certain oral diabetes medicines. Hypoglycemia can be a life-threatening condition. Symptoms of hypoglycemia, such as sleepiness, dizziness, and confusion, are similar to symptoms of having too much alcohol.  Do not drink alcohol if: ? Your health care provider tells you not to drink. ? You are pregnant, may be pregnant, or are planning to become pregnant.  If you drink alcohol: ? Do not drink on an empty stomach. ? Limit how much you use to:  0-1 drink a day for women.  0-2 drinks a day for men. ? Be aware of how much alcohol is in your drink. In the [place] one drink equals one 12 oz bottle of beer (355 mL), one 5 oz glass of wine (148 mL), or one 1 oz glass of hard liquor (44 mL). ? Keep yourself hydrated with water, diet soda, or unsweetened iced tea.  Keep in mind that regular soda, juice, and other mixers may contain a lot of sugar and must be counted as carbs. What are tips for following this plan? Reading food labels  Start by checking the serving size on the "Nutrition Facts" label of packaged foods and drinks. The amount of calories, carbs, fats, and other nutrients listed on the label is based on one serving of the item. Many items contain more than one serving per package.  Check the total grams (g) of carbs in one serving. You can calculate the number of servings of carbs in one serving by dividing the total carbs by 15. For example, if a food has 30 g of total carbs per serving, it would be equal to 2 servings of carbs.  Check the number of grams (g) of saturated fats and trans fats in one serving. Choose foods that have a low amount or none of these fats.  Check the number of milligrams (mg) of salt (sodium) in one serving. Most people should limit total sodium intake to less than 2,300 mg per day.  Always check the nutrition information of foods labeled as "low-fat" or "nonfat." These foods may be higher in added sugar or refined carbs  and should be avoided.  Talk to your dietitian to identify your daily goals for nutrients listed on the label. Shopping  Avoid buying canned, pre-made, or processed foods. These foods tend to be high in fat, sodium, and added sugar.  Shop around the outside edge of the grocery store. This is where you will most often find fresh fruits and vegetables, bulk grains, fresh meats, and fresh dairy. Cooking  Use low-heat cooking methods, such as baking, instead of high-heat cooking methods like deep frying.  Cook using healthy oils, such as olive, canola, or sunflower oil.  Avoid cooking with butter, cream, or high-fat meats. Meal planning  Eat meals and snacks regularly, preferably at the same times every day. Avoid going long periods of time without eating.  Eat foods that are high in fiber, such as fresh fruits, vegetables, beans, and whole grains. Talk with your dietitian about how many servings of carbs you can eat at each meal.  Eat 4-6 oz (112-168 g) of lean protein each day, such as lean meat, chicken, fish, eggs, or tofu. One ounce (oz) of lean protein is equal to: ? 1 oz (28 g) of meat, chicken, or fish. ? 1 egg. ?  cup (62 g) of tofu.  Eat some foods each day that contain healthy fats, such as avocado, nuts, seeds, and fish.   What foods should I eat? Fruits Berries. Apples. Oranges. Peaches. Apricots. Plums. Grapes. Mango. Papaya. Pomegranate. Kiwi. Cherries. Vegetables Lettuce. Spinach. Leafy greens, including kale, chard, collard greens, and mustard greens. Beets. Cauliflower. Cabbage. Broccoli. Carrots. Green beans. Tomatoes.  Peppers. Onions. Cucumbers. Brussels sprouts. Grains Whole grains, such as whole-wheat or whole-grain bread, crackers, tortillas, cereal, and pasta. Unsweetened oatmeal. Quinoa. Brown or wild rice. Meats and other proteins Seafood. Poultry without skin. Lean cuts of poultry and beef. Tofu. Nuts. Seeds. Dairy Low-fat or fat-free dairy products such  as milk, yogurt, and cheese. The items listed above may not be a complete list of foods and beverages you can eat. Contact a dietitian for more information. What foods should I avoid? Fruits Fruits canned with syrup. Vegetables Canned vegetables. Frozen vegetables with butter or cream sauce. Grains Refined white flour and flour products such as bread, pasta, snack foods, and cereals. Avoid all processed foods. Meats and other proteins Fatty cuts of meat. Poultry with skin. Breaded or fried meats. Processed meat. Avoid saturated fats. Dairy Full-fat yogurt, cheese, or milk. Beverages Sweetened drinks, such as soda or iced tea. The items listed above may not be a complete list of foods and beverages you should avoid. Contact a dietitian for more information. Questions to ask a health care provider  Do I need to meet with a diabetes educator?  Do I need to meet with a dietitian?  What number can I call if I have questions?  When are the best times to check my blood glucose? Where to find more information:  American Diabetes Association: diabetes.org  Academy of Nutrition and Dietetics: www.eatright.[employer] of Diabetes and Digestive and Kidney Diseases: [employer]  Association of Diabetes Care and Education Specialists: www.diabeteseducator.org Summary  It is important to have healthy eating habits because your blood sugar (glucose) levels are greatly affected by what you eat and drink.  A healthy meal plan will help you control your blood glucose and maintain a healthy lifestyle.  Your health care provider may recommend that you work with a dietitian to make a meal plan that is best for you.  Keep in mind that carbohydrates (carbs) and alcohol have immediate effects on your blood glucose levels. It is important to count carbs and to use alcohol carefully. This information is not intended to replace advice given to you by your health care provider. Make  sure you discuss any questions you have with your health care provider. Document Revised: 01/29/2019 Document Reviewed: 01/29/2019 Elsevier Patient Education  2021 [employer].  The patient verbalized understanding of instructions, educational materials, and care plan provided today and agreed to receive a mailed copy of patient instructions, educational materials, and care plan-- she reports she was unable to locate my last note when looking at her [facility] account: I have updated today's AVS with information from last outreach and mailed patient additional educational material for diabetes diet as well: -- Basic Carb counting chart -- Low carb Snacks print out   Telephone follow up appointment with care management team member scheduled for:  The patient has been provided with contact information for the care management team and has been advised to call with any health related questions or concerns.   [name], RN, BSN, [facility] RN Care Coordination- [place] [phone]: direct office [phone]: mobile

## 2020-05-25 NOTE — Chronic Care Management (AMB) (Signed)
Chronic Care Management   CCM RN Visit Note  05/25/2020 Name: Kristen [name] MRN: [id number] DOB: [birth year]  Subjective: Kristen [name] is a 75 y.o. year old female who is a primary care patient of [name], MD. The care management team was consulted for assistance with disease management and care coordination needs.    Engaged with patient by telephone for follow up visit in response to provider referral for case management and/or care coordination services.   Consent to Services:  The patient was given information about Chronic Care Management services, agreed to services, and gave verbal consent prior to initiation of services.  Please see initial visit note for detailed documentation.   Patient agreed to services and verbal consent obtained.   Assessment: Review of patient past medical history, allergies, medications, health status, including review of consultants reports, laboratory and other test data, was performed as part of comprehensive evaluation and provision of chronic care management services.   SDOH (Social Determinants of Health) assessments and interventions performed:  SDOH Interventions   Flowsheet Row Most Recent Value  SDOH Interventions   Financial Strain Interventions Other (Comment)  [Care Guide referral placed today for financial resources]     CCM Care Plan  Allergies  Allergen Reactions  . Metformin And Related Other (See Comments)    Must take XR form only, cannot tolerate Regular release metformin  . Cleocin [Clindamycin Hcl] Diarrhea  . Codeine Itching  . Doxycycline Hyclate Diarrhea  . Macrolides And Ketolides Diarrhea  . Morphine Hives  . Pentazocine Lactate Itching and Nausea And Vomiting    (GENERIC- [place])  . Vibramycin [Doxycycline Calcium] Diarrhea and Rash  . Clindamycin Diarrhea  . Definity [Perflutren Lipid Microsphere] Other (See Comments)    Patient complained of warm sensation in chest x few seconds duration when  injected Definity.No other symptoms  . Doxycycline Diarrhea  . Metformin Other (See Comments)  . Pentazocine Nausea Only  . Perflutren Other (See Comments)  . Perflutren Lipid Microspheres Other (See Comments)  . Sulfa Antibiotics Diarrhea and Rash  . Sulfonamide Derivatives Rash    Outpatient Encounter Medications as of 05/25/2020  Medication Sig  . ALPRAZolam (XANAX) 1 MG tablet Take 1 tablet (1 mg total) by mouth 3 (three) times daily as needed for anxiety.  . Ascorbic Acid (VITAMIN C) 1000 MG tablet Take 1,000 mg by mouth daily.  Marland Kitchen atorvastatin (LIPITOR) 40 MG tablet TAKE ONE TABLET BY MOUTH DAILY  . Blood Glucose Monitoring Suppl (ONE TOUCH ULTRA 2) w/Device KIT Use as advised  . Cholecalciferol (VITAMIN D3) 50 MCG (2000 UT) capsule Take 2,000 Units by mouth daily.  . cyclobenzaprine (FLEXERIL) 10 MG tablet Take 10 mg by mouth 3 (three) times daily as needed for muscle spasms.  . empagliflozin (JARDIANCE) 25 MG TABS tablet Take 1 tablet (25 mg total) by mouth daily.  . furosemide (LASIX) 20 MG tablet Take 1 tablet (20 mg total) by mouth every morning.  . gabapentin (NEURONTIN) 300 MG capsule TAKE ONE CAPSULE BY MOUTH THREE TIMES DAILY  . glucose blood (COOL BLOOD GLUCOSE TEST STRIPS) test strip Use to test blood sugar twice daily. DX: E11.9  . insulin glargine, 2 Unit Dial, (TOUJEO MAX SOLOSTAR) 300 UNIT/ML Solostar Pen Inject 50 Units into the skin daily.  . Insulin Pen Needle (NOVOFINE) 32G X 6 MM MISC 1 Act by Does not apply route daily.  . Lancets (ONETOUCH ULTRASOFT) lancets Use as instructed  . magnesium oxide (MAG-OX) 400 MG tablet  TAKE ONE TABLET BY MOUTH BY MOUTH TWICE DAILY WITH BREAKFAST AND EVENING MEAL]  . metFORMIN (GLUCOPHAGE-XR) 500 MG 24 hr tablet TAKE TWO TABLETS BY MOUTH TWICE DAILY WITH FOOD  . metoprolol succinate (TOPROL-XL) 100 MG 24 hr tablet Take 1 tablet (100 mg total) by mouth 2 (two) times daily. Take with or immediately following a meal.  . Multiple  Vitamin (MULTIVITAMIN) tablet Take 1 tablet by mouth daily.  . nitroGLYCERIN (NITROSTAT) 0.4 MG SL tablet Place 1 tablet (0.4 mg total) under the tongue every 5 (five) minutes as needed for chest pain (x 3 doses). Reported on 05/08/2015  . ondansetron (ZOFRAN-ODT) 4 MG disintegrating tablet Take 1 tablet (4 mg total) by mouth every 8 (eight) hours as needed for nausea or vomiting.  Marland Kitchen PERCOCET 10-325 MG tablet Take 1 tablet by mouth 5 (five) times daily as needed.  . potassium chloride SA (KLOR-CON) 20 MEQ tablet Take 1 tablet (20 mEq total) by mouth 2 (two) times daily.  . Semaglutide, 1 MG/DOSE, (OZEMPIC, 1 MG/DOSE,) 2 MG/1.5ML SOPN Inject 2 mg into the skin once a week.  . spironolactone (ALDACTONE) 25 MG tablet Take 1 tablet (25 mg total) by mouth 2 (two) times daily.  Marland Kitchen telmisartan (MICARDIS) 40 MG tablet TAKE ONE TABLET BY MOUTH DAILY  . thiamine 100 MG tablet Take 1 tablet (100 mg total) by mouth daily.  Marland Kitchen triamcinolone cream (KENALOG) 0.5 % Apply 1 application topically 3 (three) times daily.  Marland Kitchen warfarin (COUMADIN) 3 MG tablet TAKE ONE TABLET BY MOUTH ONCE DAILY OR USE AS DIRECTED by clinic   No facility-administered encounter medications on file as of 05/25/2020.    Patient Active Problem List   Diagnosis Date Noted  . Poor dentition requiring referral to dentistry 03/26/2020  . Thiamine deficiency 12/30/2018  . Vitamin B12 deficiency anemia due to intrinsic factor deficiency 12/20/2018  . CHF (congestive heart failure), NYHA class I, chronic, combined ([place]) 12/19/2018  . Coronary artery disease involving native coronary artery of native heart with angina pectoris ([place]) 12/19/2018  . Obesity with body mass index (BMI) of 30.0 to 39.9 01/15/2018  . Chronic idiopathic constipation 12/27/2017  . Atrial fibrillation ([place]) 10/30/2017  . Type 2 diabetes mellitus ([place]) 10/30/2017  . Eczema 08/07/2017  . Insomnia secondary to chronic pain 08/04/2016  . Visit for screening mammogram  04/14/2016  . LV dysfunction   . Routine general medical examination at a health care facility 05/03/2013  . DM (diabetes mellitus), type 2 with peripheral vascular complications ([place]) 02/40/9735  . PVC's/nonsustained VT 10/24/2012  . H/O: CVA (cerebrovascular accident) 02/09/2012  . Hypothyroidism 05/27/2011  . Fibromyalgia 10/08/2010  . Long term current use of anticoagulant 05/05/2010  . Low back pain, non-specific 05/12/2008  . Cognitive dysfunction associated with depression 01/29/2007  . Osteoarthritis 11/22/2006  . Hyperlipidemia associated with type 2 diabetes mellitus ([place]) 09/13/2006  . Coronary atherosclerosis 09/13/2006  . Paroxysmal atrial fibrillation ([place]) 09/13/2006  . GERD 09/13/2006  . Hypertension associated with diabetes ([place]) 08/30/2006    Conditions to be addressed/monitored:CHF and DMII  Care Plan : Diabetes Type 2 (Adult)  Updates made by Knox Royalty, RN since 05/25/2020 12:00 AM    Problem: Glycemic Management (Diabetes, Type 2)   Priority: High    Long-Range Goal: Glycemic Management Optimized   Start Date: 05/01/2020  Expected End Date: 10/29/2020  This Visit's Progress: On track  Recent Progress: Not on track  Priority: High  Note:   Objective:  Lab Results  Component Value Date   HGBA1C 8.0 (A) 03/26/2020 .   Lab Results  Component Value Date   CREATININE 0.90 10/16/2019   CREATININE 0.86 10/10/2019   CREATININE 0.86 04/11/2019 .   Marland Kitchen No results found for: EGFR Current Barriers:  Marland Kitchen Knowledge Deficits related to basic Diabetes pathophysiology, self care/management, and appropriate diet for self-management of DM . Difficulty obtaining or cannot afford medications- confirmed CCM Pharmacist [name] currently active for PAP and medication optimization . Unable to independently verbalize appropriate choices for DM soft diet  . Does not adhere to provider recommendations re: following low carb/ sugar diet Case Manager Clinical Goal(s):  Over  the next 6 months, patient will demonstrate improved adherence to prescribed treatment plan for diabetes self care/management as evidenced by:  . daily monitoring and recording of CBG- on track . adherence to ADA/ carb modified diet- on track, continues to require education/ reinforcement  . contacting provider for new or worsened symptoms or questions- denies today Interventions:  . Review of patient status, including review of consultants reports, relevant laboratory and other test results, and medications completed. . Collaboration with [name], MD regarding development and update of comprehensive plan of care as evidenced by provider attestation and co-signature . Inter-disciplinary care team collaboration (see longitudinal plan of care) . Discussed current clinical condition and confirmed no current clinical concerns . Provided ongoing/ reinforcement verbal and printed education around basic DM diet: discussed specific dietary strategies patient might consider given her reports of poor dentition, requiring soft diet . Placed Care Guide referral to contact patient to provide financial resources, including resources for dental providers; patient has no dental insurance  . Reviewed medication assistance programs; confirmed patient has remained in touch with CCM Pharmacist [name] and has contact information for CCM Pharmacy teams- patient awaiting delivery of Ozempic . Advised patient, providing education and rationale, to continue checking blood sugars at home 1-2 times per day and to begin recording on paper for ongoing review; calling care providers for questions/ concerns.  . Reviewed this week's blood sugars with patient: reports husband threw out paper with additionally recorded values; confirmed she has started using a journal- reports fasting blood sugar today of 160 and post-prandial values this week between 180-200 . Encouraged patient to schedule eye exam for 2022; confirmed that  she continues to monitor feet each day and is able to verbalize importance of daily foot monitoring in setting of DM . Placed today's and 05/01/20 AVS in mail to patient, along with additional printed educational material re: diet in DM, as patient reports she is unable to find in My-Chart  . Discussed plans with patient for ongoing care management follow up and provided patient with direct contact information for care management team Patient Goals: . Continue checking blood sugar twice daily and begin writing down these values from home monitoring: check first thing in the morning before you eat and again 2 hours after a large meal . Check blood sugar if I feel it is too high or too low . Obtain a dedicated notebook or piece of paper to write down blood sugars so we can review these during our future phone calls  . Take the blood sugar log to all doctor visits- this will help the doctor make decisions about your treatment plan and medications . If you have any questions about the status of your medication assistance programs, please reach out to the pharmacist that is helping you with this . I have asked a care  guide to contact you about financial resources, including affordable dental providers . Contact your care providers if you have any questions or concerns about your blood sugar values at home or you medications . Read over the attached information about choices of food that are good to eat when you have diabetes, and begin making small changes to your soft diet to decrease you sugar and carbohydrate intake at home: we will talk about this during future phone calls Self-Care Activities . UNABLE to independently verbalize appropriate dietary choices for DM in setting of poor dentition/ soft diet: patient will continue to require reinforcement . Attends all scheduled provider appointments Follow Up Plan:  . Telephone follow up appointment with care management team member scheduled for: 06/24/20 at  3:30 pm . The patient has been provided with contact information for the care management team and has been advised to call with any health related questions or concerns.     Care Plan : Heart Failure (Adult)  Updates made by Michaela Corner, RN since 05/25/2020 12:00 AM    Problem: Symptom Exacerbation (Heart Failure)   Priority: Medium    Long-Range Goal: Symptom Exacerbation Prevented or Minimized   Start Date: 05/01/2020  Expected End Date: 10/29/2020  This Visit's Progress: On track  Recent Progress: On track  Priority: Medium  Note:   Current Barriers:  Marland Kitchen Knowledge deficit related to basic heart failure pathophysiology and self care management- needs ongoing reinforcement . Unable to independently verbalize comprehensive signs/ symptoms of HF exacerbation Case Manager Clinical Goal(s):  Over the next 6 months patient will: . verbalize understanding of symptoms associated with Heart Failure and follow Action Plan and when to call doctor- on track . Continue monitoring/ recording daily weight daily (notifying MD of 3 lb weight gain over night or 5 lb in a week)- on track Interventions:  . Collaboration with Etta Grandchild, MD regarding development and update of comprehensive plan of care as evidenced by provider attestation and co-signature . Inter-disciplinary care team collaboration (see longitudinal plan of care) . Basic overview and discussion of pathophysiology of Heart Failure reviewed/ reinforced . Reviewed recent cardiology appointment/ office visit with patient: confirmed she has no questions . Reinforced previously provided education on low sodium diet- confirmed that patient continues to follow low sodium diet . Reviewed recent daily weights at home with patient- she reports weights at home consistently "208 lbs" . Reinforced basic action plan for weight gain in setting of CHF with patient: she reports ongoing need for reinforcement/ reminding Patient Goals/Self-Care  Activities . Continue to weigh myself daily and track weight in diary or on a dedicated calendar: we will review your weights during our future phone calls . Call cardiologist/ care providers if I gain more than 2 pounds in one day or 5 pounds in one week . Use salt in moderation . Watch for swelling in feet, ankles and legs every day . I am glad that you had a good visit with your cardiologist on May 13, 2020- please keep your cardiologist updated if you develop concerns or have changes in your clinical status . Today, you reported weights at home consistently at 208  Follow Up Plan:  . Telephone follow up appointment with care management team member scheduled for: 06/24/20 at 3:30 pm . The patient has been provided with contact information for the care management team and has been advised to call with any health related questions or concerns.       Plan: Telephone follow  up appointment with care management team member scheduled for:  06/24/20 at 3:30 pm  The patient has been provided with contact information for the care management team and has been advised to call with any health related questions or concerns.   [name], RN, BSN, [facility] RN Care Coordination- [place] [phone]: direct office [phone]: mobile

## 2020-05-26 ENCOUNTER — Other Ambulatory Visit: Payer: Self-pay

## 2020-05-26 ENCOUNTER — Ambulatory Visit (INDEPENDENT_AMBULATORY_CARE_PROVIDER_SITE_OTHER): Payer: Medicare Other | Admitting: General Practice

## 2020-05-26 DIAGNOSIS — E538 Deficiency of other specified B group vitamins: Secondary | ICD-10-CM | POA: Diagnosis not present

## 2020-05-26 DIAGNOSIS — I480 Paroxysmal atrial fibrillation: Secondary | ICD-10-CM

## 2020-05-26 DIAGNOSIS — Z7901 Long term (current) use of anticoagulants: Secondary | ICD-10-CM

## 2020-05-26 LAB — POCT INR: INR: 3 (ref 2.0–3.0)

## 2020-05-26 MED ORDER — CYANOCOBALAMIN 1000 MCG/ML IJ SOLN
1000.0000 ug | Freq: Once | INTRAMUSCULAR | Status: AC
  Administered 2020-05-26: 1000 ug via INTRAMUSCULAR

## 2020-05-26 NOTE — Patient Instructions (Addendum)
Pre visit review using our clinic review tool, if applicable. No additional management support is needed unless otherwise documented below in the visit note.  Change dosage and take 1 tablet daily except 2 tablets on Mon and 1 1/2 on Fridays. Re-check in 4 weeks.

## 2020-05-26 NOTE — Progress Notes (Signed)
I have reviewed and agree.

## 2020-05-28 ENCOUNTER — Telehealth: Payer: Self-pay

## 2020-05-28 NOTE — Telephone Encounter (Signed)
Pt has been informed that medication was here and ready for pick up. Located in the nurse room fridge.

## 2020-06-01 DIAGNOSIS — M150 Primary generalized (osteo)arthritis: Secondary | ICD-10-CM | POA: Diagnosis not present

## 2020-06-01 DIAGNOSIS — G894 Chronic pain syndrome: Secondary | ICD-10-CM | POA: Diagnosis not present

## 2020-06-01 DIAGNOSIS — Z79891 Long term (current) use of opiate analgesic: Secondary | ICD-10-CM | POA: Diagnosis not present

## 2020-06-01 DIAGNOSIS — M65312 Trigger thumb, left thumb: Secondary | ICD-10-CM | POA: Diagnosis not present

## 2020-06-03 ENCOUNTER — Ambulatory Visit: Payer: Medicare Other | Admitting: *Deleted

## 2020-06-03 DIAGNOSIS — M797 Fibromyalgia: Secondary | ICD-10-CM

## 2020-06-03 DIAGNOSIS — F32A Depression, unspecified: Secondary | ICD-10-CM | POA: Diagnosis not present

## 2020-06-03 DIAGNOSIS — I152 Hypertension secondary to endocrine disorders: Secondary | ICD-10-CM

## 2020-06-03 DIAGNOSIS — E1151 Type 2 diabetes mellitus with diabetic peripheral angiopathy without gangrene: Secondary | ICD-10-CM | POA: Diagnosis not present

## 2020-06-03 DIAGNOSIS — I5042 Chronic combined systolic (congestive) and diastolic (congestive) heart failure: Secondary | ICD-10-CM | POA: Diagnosis not present

## 2020-06-03 DIAGNOSIS — E1159 Type 2 diabetes mellitus with other circulatory complications: Secondary | ICD-10-CM

## 2020-06-04 NOTE — Patient Instructions (Signed)
Visit Information  PATIENT GOALS: Goals Addressed            This Visit's Progress   . Work on managing my worry and fear       Timeframe:  Long-Range Goal Priority:  High Start Date:    04/09/2020                         Expected End Date:  07/07/2020              Follow Up Date 07/01/2020   - Continue with visits with  Dr Casimiro Needle and further discuss with him about resuming therapy or not with a new therapist/counselor - practice relaxation or meditation daily -continue with positive self talk, "don't sweat the small stuff", and allowing yourself to be more aware of your emotions and actions that come - reach out to CSW and/or Dr Casimiro Needle if needs arise and want to pursue counseling appointment before scheduled visit with Dr Casimiro Needle     Why is this important?    When you are stressed, down or upset, your body reacts too.   For example, your blood pressure may get higher; you may have a headache or stomachache.   When your emotions get the best of you, your body's ability to fight off cold and flu gets weak.   These steps will help you manage your emotions.     Notes:        The patient verbalized understanding of instructions, educational materials, and care plan provided today and declined offer to receive copy of patient instructions, educational materials, and care plan.   Telephone follow up appointment with care management team member scheduled for:07/01/2020 [place] MSW, LCSW Licensed Clinical Social Worker [place] [phone]

## 2020-06-04 NOTE — Chronic Care Management (AMB) (Signed)
Chronic Care Management    Clinical Social Work Note  06/04/2020 Name: Mackinze Criado MRN: [id number] DOB: Mar 26, 1945  [name] who is a primary care patient of [name], MD. The CCM team was consulted to assist the patient with chronic disease management and/or care coordination needs related to: [employer] , Mental Health Counseling and Resources and Grief Counseling.  Engaged with patient by telephone for follow up visit in response to provider referral for social work chronic care management and care coordination services.  Consent to Services:  The patient was given information about Chronic Care Management services, agreed to services, and gave verbal consent prior to initiation of services.  Please see initial visit note for detailed documentation.  Patient agreed to services and consent obtained.  Assessment: Review of patient past medical history, allergies, medications, and health status, including review of relevant consultants reports was performed today as part of a comprehensive evaluation and provision of chronic care management and care coordination services.    SDOH (Social Determinants of Health) assessments and interventions performed:  SDOH Interventions   Flowsheet Row Most Recent Value  SDOH Interventions   Depression Interventions/Treatment  Currently on Treatment       Advanced Directives Status: Not addressed in this encounter.  CCM Care Plan  Allergies  Allergen Reactions  . Metformin And Related Other (See Comments)    Must take XR form only, cannot tolerate Regular release metformin  . Cleocin [Clindamycin Hcl] Diarrhea  . Codeine Itching  . Doxycycline Hyclate Diarrhea  . Macrolides And Ketolides Diarrhea  . Morphine Hives  . Pentazocine Lactate Itching and Nausea And Vomiting    (GENERIC- [place])  . Vibramycin [Doxycycline Calcium] Diarrhea and Rash  . Clindamycin Diarrhea  . Definity  [Perflutren Lipid Microsphere] Other (See Comments)    Patient complained of warm sensation in chest x few seconds duration when injected Definity.No other symptoms  . Doxycycline Diarrhea  . Metformin Other (See Comments)  . Pentazocine Nausea Only  . Perflutren Other (See Comments)  . Perflutren Lipid Microspheres Other (See Comments)  . Sulfa Antibiotics Diarrhea and Rash  . Sulfonamide Derivatives Rash    Outpatient Encounter Medications as of 06/03/2020  Medication Sig  . ALPRAZolam (XANAX) 1 MG tablet Take 1 tablet (1 mg total) by mouth 3 (three) times daily as needed for anxiety.  . Ascorbic Acid (VITAMIN C) 1000 MG tablet Take 1,000 mg by mouth daily.  Marland Kitchen atorvastatin (LIPITOR) 40 MG tablet TAKE ONE TABLET BY MOUTH DAILY  . Blood Glucose Monitoring Suppl (ONE TOUCH ULTRA 2) w/Device KIT Use as advised  . Cholecalciferol (VITAMIN D3) 50 MCG (2000 UT) capsule Take 2,000 Units by mouth daily.  . cyclobenzaprine (FLEXERIL) 10 MG tablet Take 10 mg by mouth 3 (three) times daily as needed for muscle spasms.  . empagliflozin (JARDIANCE) 25 MG TABS tablet Take 1 tablet (25 mg total) by mouth daily.  . furosemide (LASIX) 20 MG tablet Take 1 tablet (20 mg total) by mouth every morning.  . gabapentin (NEURONTIN) 300 MG capsule TAKE ONE CAPSULE BY MOUTH THREE TIMES DAILY  . glucose blood (COOL BLOOD GLUCOSE TEST STRIPS) test strip Use to test blood sugar twice daily. DX: E11.9  . insulin glargine, 2 Unit Dial, (TOUJEO MAX SOLOSTAR) 300 UNIT/ML Solostar Pen Inject 50 Units into the skin daily.  . Insulin Pen Needle (NOVOFINE) 32G X 6 MM MISC 1 Act by Does not apply route  daily.  . Lancets (ONETOUCH ULTRASOFT) lancets Use as instructed  . magnesium oxide (MAG-OX) 400 MG tablet TAKE ONE TABLET BY MOUTH BY MOUTH TWICE DAILY WITH BREAKFAST AND EVENING MEAL]  . metFORMIN (GLUCOPHAGE-XR) 500 MG 24 hr tablet TAKE TWO TABLETS BY MOUTH TWICE DAILY WITH FOOD  . metoprolol succinate (TOPROL-XL) 100 MG 24  hr tablet Take 1 tablet (100 mg total) by mouth 2 (two) times daily. Take with or immediately following a meal.  . Multiple Vitamin (MULTIVITAMIN) tablet Take 1 tablet by mouth daily.  . nitroGLYCERIN (NITROSTAT) 0.4 MG SL tablet Place 1 tablet (0.4 mg total) under the tongue every 5 (five) minutes as needed for chest pain (x 3 doses). Reported on 05/08/2015  . ondansetron (ZOFRAN-ODT) 4 MG disintegrating tablet Take 1 tablet (4 mg total) by mouth every 8 (eight) hours as needed for nausea or vomiting.  Marland Kitchen PERCOCET 10-325 MG tablet Take 1 tablet by mouth 5 (five) times daily as needed.  . potassium chloride SA (KLOR-CON) 20 MEQ tablet Take 1 tablet (20 mEq total) by mouth 2 (two) times daily.  . Semaglutide, 1 MG/DOSE, (OZEMPIC, 1 MG/DOSE,) 2 MG/1.5ML SOPN Inject 2 mg into the skin once a week.  . spironolactone (ALDACTONE) 25 MG tablet Take 1 tablet (25 mg total) by mouth 2 (two) times daily.  Marland Kitchen telmisartan (MICARDIS) 40 MG tablet TAKE ONE TABLET BY MOUTH DAILY  . thiamine 100 MG tablet Take 1 tablet (100 mg total) by mouth daily.  Marland Kitchen triamcinolone cream (KENALOG) 0.5 % Apply 1 application topically 3 (three) times daily.  Marland Kitchen warfarin (COUMADIN) 3 MG tablet TAKE ONE TABLET BY MOUTH ONCE DAILY OR USE AS DIRECTED by clinic   No facility-administered encounter medications on file as of 06/03/2020.    Patient Active Problem List   Diagnosis Date Noted  . Poor dentition requiring referral to dentistry 03/26/2020  . Thiamine deficiency 12/30/2018  . Vitamin B12 deficiency anemia due to intrinsic factor deficiency 12/20/2018  . CHF (congestive heart failure), NYHA class I, chronic, combined ([place]) 12/19/2018  . Coronary artery disease involving native coronary artery of native heart with angina pectoris ([place]) 12/19/2018  . Obesity with body mass index (BMI) of 30.0 to 39.9 01/15/2018  . Chronic idiopathic constipation 12/27/2017  . Atrial fibrillation ([place]) 10/30/2017  . Type 2 diabetes mellitus ([place])  10/30/2017  . Eczema 08/07/2017  . Insomnia secondary to chronic pain 08/04/2016  . Visit for screening mammogram 04/14/2016  . LV dysfunction   . Routine general medical examination at a health care facility 05/03/2013  . DM (diabetes mellitus), type 2 with peripheral vascular complications ([place]) 65/68/1275  . PVC's/nonsustained VT 10/24/2012  . H/O: CVA (cerebrovascular accident) 02/09/2012  . Hypothyroidism 05/27/2011  . Fibromyalgia 10/08/2010  . Long term current use of anticoagulant 05/05/2010  . Low back pain, non-specific 05/12/2008  . Cognitive dysfunction associated with depression 01/29/2007  . Osteoarthritis 11/22/2006  . Hyperlipidemia associated with type 2 diabetes mellitus ([place]) 09/13/2006  . Coronary atherosclerosis 09/13/2006  . Paroxysmal atrial fibrillation ([place]) 09/13/2006  . GERD 09/13/2006  . Hypertension associated with diabetes ([place]) 08/30/2006    Conditions to be addressed/monitored: HTN, Anxiety and Depression; Limited social support and Mental Health Concerns   Care Plan : LCSW Plan of Care  Updates made by Deirdre Peer, LCSW since 06/04/2020 12:00 AM    Problem: Lacks strategy for management of anxiety around sleeping and fear of dying.   Priority: High    Long-Range Goal: Establish  and Maintain plan for anxiety management   Start Date: 04/09/2020  Expected End Date: 09/03/2020  This Visit's Progress: On track  Recent Progress: On track  Priority: High  Note:   Current Barriers:  . Pt awaiting a face to face appointment with Dr Casimiro Needle to further discuss potential outpatient counselor/therapists that he will recommend and arrange.  . Pt does not feel she needs therapy/counseling; stating, "I have been going through a lot of this for 52 years" . Pt acknowledges grief from the loss of her grandson- appears to be coping in a normal/healthy manner . Chronic Mental Health needs related to anxiety/PTSD - has participated in an initial visit with [facility]  clinician and was not set up for follow up. Pt has decided to pause and does not plan on re-scheduling and/or connecting with new mental health therapist at this time. . Limited social support, Mental Health Concerns , and Family and relationship dysfunction . Suicidal Ideation/Homicidal Ideation: No . Pt shares she is a bit overwhelmed by the pile of mail she has to go through and concerns for disposing of the personal documents-  Clinical Social Work Goal(s):  Marland Kitchen Over the next 30 days pt will begin to go through the stack of mail and organize a pile of papers that need to be shredded for privacy/confidentiality and those that can just be recycled or trashed.  . Pt will continue to focus on positive self talk and thought patterns, not over-reacting, and seeking ways to better cope with uncomfortable feelings as they arise . Over the next 90 days, patient will work with SW monthly by telephone to reduce or manage symptoms related to anxiety Interventions: . Pt assessed by phone and reports she is doing "ok". Pt shared how she had just dropped and shattered a glass jar in kitchen and how she managed  and did not "get out of whack".  CSW validated pt's ability to cope in a way of accepting the accident and proceeding with the actions of cleaning it up.  Pt was able to laugh about it and voiced a sense of pride in her handling of the situation. Pt also shared with CSW that she feels her depression is better (PHQ9 score is a 7 today; down from 9 previously).  She is planning to further discuss whether to seek counseling/therapy with a new therapist when she sees Dr Casimiro Needle next month. Pt feels she does not need this; as well, she declines Grief counseling as she has done this in the past and feels she is managing her grief/loss in a healthy way.  . CSW shared with pt about a possible opportunity for her to participate in a 'free-shed' day where she can take her mail/papers that need to be shredded for privacy  purposes and she plans to begin going through the pile(s) and organizing a box for such a shredding event.  Marland Kitchen PHQ9 Depression Score= 7 (mild)  . Motivational Interviewing . Solution-Focused Strategies . Mindfulness or Relaxation Training . Brief CBT . Grief Counseling . Emotional/Supportive Counseling . Psychoeducation and/or Health Education . Problem Solving Patient Coping Strengths:  . Spirituality . Hopefulness . Self Advocate . Able to Communicate Effectively . Rapport and trust with her Psychiatrist and PCP Patient Self Care Deficits:  . Lacks social connections Patient Goals:   Continue with visits with  Dr Casimiro Needle and further discuss with him about resuming therapy or not with a new therapist/counselor - practice relaxation or meditation daily -continue with positive self talk, "  don't sweat the small stuff", and allowing yourself to be more aware of your emotions and actions that come - reach out to CSW and/or Dr Casimiro Needle if needs arise and want to pursue counseling appointment before scheduled visit with Dr Casimiro Needle   Follow Up Plan: Appointment scheduled for SW follow up with client by phone on:  07/01/2020 at 2pm.      Follow Up Plan: Appointment scheduled for SW follow up with client by phone on:  07/01/2020      Eduard Clos MSW, [place] Licensed Clinical Social Worker [place] [phone]

## 2020-06-05 ENCOUNTER — Telehealth: Payer: Self-pay | Admitting: Internal Medicine

## 2020-06-05 NOTE — Telephone Encounter (Signed)
   Telephone encounter was:  Unsuccessful.  06/05/2020 Name: Kristen [name] MRN: [id number] DOB: 05-18-1945  Unsuccessful outbound call made today to assist with:  Financial Difficulties related to dental needs  Outreach Attempt:  1st Attempt  A HIPAA compliant voice message was left requesting a return call.  Instructed patient to call back at [phone].  [place], Care Management Phone: [phone] Email: [email or web address]

## 2020-06-08 ENCOUNTER — Telehealth: Payer: Self-pay | Admitting: Internal Medicine

## 2020-06-08 NOTE — Telephone Encounter (Signed)
   Telephone encounter was:  Successful.  06/08/2020 Name: [place] Giammona MRN: [id number] DOB: [birth year]  [name] who is a primary care patient of [name], MD . The community resource team was consulted for assistance with patient needing resources for dental care.   Care guide performed the following interventions: Patient provided with information about care guide support team and interviewed to confirm resource needs Discussed resources to assist with dental resources. I will email her a list of dental clinics that can give her little to no cost dentristy. She agreed for me to email these to her. .  Follow Up Plan:  Care guide will follow up with patient by phone over the next week to confirm that she received the email with the dental resources.   [place], Care Management Phone: [phone] Email: [email or web address]

## 2020-06-18 ENCOUNTER — Telehealth: Payer: Self-pay | Admitting: Pharmacist

## 2020-06-18 ENCOUNTER — Other Ambulatory Visit: Payer: Self-pay | Admitting: Cardiovascular Disease

## 2020-06-18 ENCOUNTER — Other Ambulatory Visit: Payer: Self-pay | Admitting: Internal Medicine

## 2020-06-18 DIAGNOSIS — I2510 Atherosclerotic heart disease of native coronary artery without angina pectoris: Secondary | ICD-10-CM

## 2020-06-18 DIAGNOSIS — E1151 Type 2 diabetes mellitus with diabetic peripheral angiopathy without gangrene: Secondary | ICD-10-CM

## 2020-06-18 DIAGNOSIS — E1169 Type 2 diabetes mellitus with other specified complication: Secondary | ICD-10-CM

## 2020-06-18 DIAGNOSIS — E785 Hyperlipidemia, unspecified: Secondary | ICD-10-CM

## 2020-06-18 DIAGNOSIS — E1159 Type 2 diabetes mellitus with other circulatory complications: Secondary | ICD-10-CM

## 2020-06-18 NOTE — Progress Notes (Signed)
Chronic Care Management Pharmacy Assistant   Name: Kristen [name]  MRN: [id number] DOB: Aug 01, 1945   Reason for Encounter: Medication Coordination Call    Recent office visits:  None ID  Recent consult visits:  None ID  Hospital visits:  None in previous 6 months  Medications: Outpatient Encounter Medications as of 06/18/2020  Medication Sig  . ALPRAZolam (XANAX) 1 MG tablet Take 1 tablet (1 mg total) by mouth 3 (three) times daily as needed for anxiety.  . Ascorbic Acid (VITAMIN C) 1000 MG tablet Take 1,000 mg by mouth daily.  Marland Kitchen atorvastatin (LIPITOR) 40 MG tablet TAKE ONE TABLET BY MOUTH ONCE DAILY  . Blood Glucose Monitoring Suppl (ONE TOUCH ULTRA 2) w/Device KIT Use as advised  . Cholecalciferol (VITAMIN D3) 50 MCG (2000 UT) capsule Take 2,000 Units by mouth daily.  . cyclobenzaprine (FLEXERIL) 10 MG tablet Take 10 mg by mouth 3 (three) times daily as needed for muscle spasms.  . empagliflozin (JARDIANCE) 25 MG TABS tablet Take 1 tablet (25 mg total) by mouth daily.  . furosemide (LASIX) 20 MG tablet Take 1 tablet (20 mg total) by mouth every morning.  . gabapentin (NEURONTIN) 300 MG capsule TAKE ONE CAPSULE BY MOUTH THREE TIMES DAILY  . glucose blood (COOL BLOOD GLUCOSE TEST STRIPS) test strip Use to test blood sugar twice daily. DX: E11.9  . insulin glargine, 2 Unit Dial, (TOUJEO MAX SOLOSTAR) 300 UNIT/ML Solostar Pen Inject 50 Units into the skin daily.  . Insulin Pen Needle (NOVOFINE) 32G X 6 MM MISC 1 Act by Does not apply route daily.  . Lancets (ONETOUCH ULTRASOFT) lancets Use as instructed  . magnesium oxide (MAG-OX) 400 MG tablet TAKE ONE TABLET BY MOUTH BY MOUTH TWICE DAILY WITH BREAKFAST AND EVENING MEAL]  . metFORMIN (GLUCOPHAGE-XR) 500 MG 24 hr tablet TAKE TWO TABLETS BY MOUTH TWICE DAILY WITH FOOD  . metoprolol succinate (TOPROL-XL) 100 MG 24 hr tablet Take 1 tablet (100 mg total) by mouth 2 (two) times daily. Take with or immediately following a meal.   . Multiple Vitamin (MULTIVITAMIN) tablet Take 1 tablet by mouth daily.  . nitroGLYCERIN (NITROSTAT) 0.4 MG SL tablet Place 1 tablet (0.4 mg total) under the tongue every 5 (five) minutes as needed for chest pain (x 3 doses). Reported on 05/08/2015  . ondansetron (ZOFRAN-ODT) 4 MG disintegrating tablet Take 1 tablet (4 mg total) by mouth every 8 (eight) hours as needed for nausea or vomiting.  Marland Kitchen PERCOCET 10-325 MG tablet Take 1 tablet by mouth 5 (five) times daily as needed.  . potassium chloride SA (KLOR-CON) 20 MEQ tablet Take 1 tablet (20 mEq total) by mouth 2 (two) times daily.  . Semaglutide, 1 MG/DOSE, (OZEMPIC, 1 MG/DOSE,) 2 MG/1.5ML SOPN Inject 2 mg into the skin once a week.  . spironolactone (ALDACTONE) 25 MG tablet Take 1 tablet (25 mg total) by mouth 2 (two) times daily.  Marland Kitchen telmisartan (MICARDIS) 40 MG tablet TAKE ONE TABLET BY MOUTH ONCE DAILY  . thiamine 100 MG tablet Take 1 tablet (100 mg total) by mouth daily.  Marland Kitchen triamcinolone cream (KENALOG) 0.5 % Apply 1 application topically 3 (three) times daily.  Marland Kitchen warfarin (COUMADIN) 3 MG tablet TAKE ONE TABLET BY MOUTH ONCE DAILY OR USE AS DIRECTED by clinic   No facility-administered encounter medications on file as of 06/18/2020.   Reviewed chart for medication changes ahead of medication coordination call.  No OVs, Consults, or hospital visits since last care coordination call/Pharmacist  visit. (If appropriate, list visit date, provider name)  No medication changes indicated OR if recent visit, treatment plan here.  BP Readings from Last 3 Encounters:  05/13/20 110/72  03/26/20 138/70  11/19/19 104/66    Lab Results  Component Value Date   HGBA1C 8.0 (A) 03/26/2020     Patient obtains medications through Vials  30 Days   Last adherence delivery included: (medication name and frequency) Alprazolam 1 mg  Take three tabs daily for anxiety  Patient is due for next adherence delivery on: 06/25/20. Called patient and reviewed  medications and coordinated delivery.  This delivery to include: Alprazolam 1 mg take three tabs daily for anxiety Spironolactone 25 mg take 1 tab daily  Patient will need a short fill of cyclobenzaprine prior to adherence delivery. (To align with sync date or if PRN med)   Patient declined the following medications: percocet has appt with Dr. Elta Guadeloupe on 07/08/20 will ask for refill,metoprolol 88 tabs left, furosemide 3 month supply left, telmisartan 35 tabs left, atorvastatin 38 tabs left, potassium 42 tabs left, mag oxide 66 tabs left.   Confirmed delivery date of 06/25/20, advised patient that pharmacy will contact them the morning of delivery.  [place] Pharmacist Assistant [phone]  Time spent:74

## 2020-06-23 ENCOUNTER — Other Ambulatory Visit: Payer: Self-pay | Admitting: Internal Medicine

## 2020-06-23 ENCOUNTER — Ambulatory Visit (INDEPENDENT_AMBULATORY_CARE_PROVIDER_SITE_OTHER): Payer: Medicare Other | Admitting: Internal Medicine

## 2020-06-23 ENCOUNTER — Other Ambulatory Visit: Payer: Self-pay

## 2020-06-23 DIAGNOSIS — Z7901 Long term (current) use of anticoagulants: Secondary | ICD-10-CM

## 2020-06-23 DIAGNOSIS — E538 Deficiency of other specified B group vitamins: Secondary | ICD-10-CM

## 2020-06-23 DIAGNOSIS — I480 Paroxysmal atrial fibrillation: Secondary | ICD-10-CM

## 2020-06-23 LAB — POCT INR: INR: 2.3 (ref 2.0–3.0)

## 2020-06-23 MED ORDER — CYANOCOBALAMIN 1000 MCG/ML IJ SOLN
1000.0000 ug | Freq: Once | INTRAMUSCULAR | Status: AC
  Administered 2020-06-23: 1000 ug via INTRAMUSCULAR

## 2020-06-23 NOTE — Patient Instructions (Addendum)
Pre visit review using our clinic review tool, if applicable. No additional management support is needed unless otherwise documented below in the visit note.  Continue to take 1 tablet daily except 2 tablets on Mon and 1 1/2 on Fridays. Re-check in 4 weeks.

## 2020-06-23 NOTE — Progress Notes (Signed)
I have reviewed and agree.

## 2020-06-24 ENCOUNTER — Telehealth: Payer: Medicare Other

## 2020-06-24 ENCOUNTER — Telehealth: Payer: Self-pay | Admitting: *Deleted

## 2020-06-24 NOTE — Telephone Encounter (Signed)
  Chronic Care Management   Outreach Note  06/24/2020 Name: Calleen Alvis MRN: [id number] DOB: Oct 21, 1945  Referred by: [name], MD Reason for referral : Chronic Care Management (DM; CHF- Unsuccessful outreach attempt)  An unsuccessful telephone outreach was attempted today for previously scheduled appointment. The patient was referred to the case management team for assistance with care management and care coordination.    A HIPAA compliant phone message was left for the patient providing contact information and requesting a return call.   Follow Up Plan:    The patient has been provided with contact information for the care management team and has been advised to call with any health related questions or concerns.   The care management team will reach out to the patient to re-schedule today's missed appointment; message sent to care guide requesting call to patient to re-schedule   [name], RN, BSN, [place] [phone]: direct office [phone]: mobile

## 2020-06-25 ENCOUNTER — Ambulatory Visit (INDEPENDENT_AMBULATORY_CARE_PROVIDER_SITE_OTHER): Payer: Medicare Other | Admitting: *Deleted

## 2020-06-25 DIAGNOSIS — E1151 Type 2 diabetes mellitus with diabetic peripheral angiopathy without gangrene: Secondary | ICD-10-CM | POA: Diagnosis not present

## 2020-06-25 DIAGNOSIS — Z794 Long term (current) use of insulin: Secondary | ICD-10-CM | POA: Diagnosis not present

## 2020-06-25 DIAGNOSIS — F32A Depression, unspecified: Secondary | ICD-10-CM | POA: Diagnosis not present

## 2020-06-25 DIAGNOSIS — I5042 Chronic combined systolic (congestive) and diastolic (congestive) heart failure: Secondary | ICD-10-CM

## 2020-06-25 DIAGNOSIS — E1149 Type 2 diabetes mellitus with other diabetic neurological complication: Secondary | ICD-10-CM | POA: Diagnosis not present

## 2020-06-25 NOTE — Patient Instructions (Signed)
Visit Information  Kristen [name], it was nice talking with you today   Please read over the attached information, and we will discuss during future phone calls    I look forward to talking to you again for an update on Wednesday September 16, 2020 at 2:30 pm- please be listening out for my call that day.  I will call as close to 2:30 pm as possible; I look forward to hearing about your progress.   Please don't hesitate to contact me if I can be of assistance to you before our next scheduled appointment.   Kristen Rack, RN, BSN, [facility] RN Care Coordination- [place] [phone]: direct office [phone]: mobile     PATIENT GOALS: Goals Addressed            This Visit's Progress   . Monitor and Manage My Blood Sugar-Diabetes Type 2   On track    Timeframe:  Long-Range Goal Priority:  High Start Date:       05/01/20                      Expected End Date:   10/29/20                    Follow Up Date 09/16/20 at 2:30 pm   . Continue checking blood sugar twice daily and begin writing down these values from home monitoring: check first thing in the morning before you eat and again 2 hours after a large meal . Check blood sugar if I feel it is too high or too low . Obtain a dedicated notebook or piece of paper to write down blood sugars so we can review these during our future phone calls  . Take the blood sugar log to all doctor visits- this will help the doctor make decisions about your treatment plan and medications . I am glad to hear that you have received your medication assistance program for Ozempic, please stay in touch with the pharmacist that is helping you with this Kristen [name] and her team) . I am glad you spoke with the care guide about financial resources, including affordable dental providers-- please continue your efforts to make appointments with these providers as soon as you are able; once you get your dental work completed, it should help you  have better toleration of your diet and better control over your diabetes . Don't forget to schedule your next appointment with Dr. Ronnald Ramp for an updated A1-C; your next A1-C should be updated in June 2022 . Contact your care providers if you have any questions or concerns about your blood sugar values at home or you medications . Frequently review the information you were sent about choices of food that are good to eat when you have diabetes, continue your efforts to begin making small changes to your soft diet to decrease you sugar and carbohydrate intake at home: we will talk about this during our future phone calls . Make an appointment with your podiatry provider as we discussed . Make an appointment for your annual eye exam   Why is this important?    Checking your blood sugar at home helps to keep it from getting very high or very low.   Writing the results in a diary or log helps the doctor know how to care for you.   Your blood sugar log should have the time, date and the results.   Also, write down  the amount of insulin or other medicine that you take.   Other information, like what you ate, exercise done and how you were feeling, will also be helpful.         . Track and Manage Fluids and Swelling-Heart Failure   On track    Timeframe:  Long-Range Goal Priority:  Medium Start Date:        05/01/20                     Expected End Date:   10/29/20                    Follow Up Date 09/16/20 at 2:30 pm  . Continue to weigh myself daily and track weight in diary or on a dedicated calendar: we will review your weights during our future phone calls . You reported today general weight ranges between 208-210: this is right in the range you reported last month: keep up the great work keeping your daily weights in range . Call cardiologist/ care providers if I gain more than 2 pounds in one day or 5 pounds in one week . Use salt in moderation . Watch for swelling in feet, ankles and  legs every day . Please keep your cardiologist updated if you develop concerns or have changes in your clinical status . Today, you reported weights at home consistently between 208- 210 lbs   Why is this important?    It is important to check your weight daily and watch how much salt and liquids you have.   It will help you to manage your heart failure.          Heart Failure Action Plan A heart failure action plan helps you understand what to do when you have symptoms of heart failure. Your action plan is a color-coded plan that lists the symptoms to watch for and indicates what actions to take.  If you have symptoms in the red zone, you need medical care right away.  If you have symptoms in the yellow zone, you are having problems.  If you have symptoms in the green zone, you are doing well. Follow the plan that was created by you and your health care provider. Review your plan each time you visit your health care provider. Red zone These signs and symptoms mean you should get medical help right away:  You have trouble breathing when resting.  You have a dry cough that is getting worse.  You have swelling or pain in your legs or abdomen that is getting worse.  You suddenly gain more than 2-3 lb (0.9-1.4 kg) in 24 hours, or more than 5 lb (2.3 kg) in a week. This amount may be more or less depending on your condition.  You have trouble staying awake or you feel confused.  You have chest pain.  You do not have an appetite.  You pass out.  You have worsening sadness or depression. If you have any of these symptoms, call your local emergency services (911 in the [place] right away. Do not drive yourself to the hospital.   Yellow zone These signs and symptoms mean your condition may be getting worse and you should make some changes:  You have trouble breathing when you are active, or you need to sleep with your head raised on extra pillows to help you breathe.  You have  swelling in your legs or abdomen.  You gain 2-3 lb (0.9-1.4 kg) in  24 hours, or 5 lb (2.3 kg) in a week. This amount may be more or less depending on your condition.  You get tired easily.  You have trouble sleeping.  You have a dry cough. If you have any of these symptoms:  Contact your health care provider within the next day.  Your health care provider may adjust your medicines.   Green zone These signs mean you are doing well and can continue what you are doing:  You do not have shortness of breath.  You have very little swelling or no new swelling.  Your weight is stable (no gain or loss).  You have a normal activity level.  You do not have chest pain or any other new symptoms.   Follow these instructions at home:  Take over-the-counter and prescription medicines only as told by your health care provider.  Weigh yourself daily. Your target weight is __________ lb (__________ kg). ? Call your health care provider if you gain more than __________ lb (__________ kg) in 24 hours, or more than __________ lb (__________ kg) in a week. ? Health care provider name: _____________________________________________________ ? Health care provider phone number: _____________________________________________________  Eat a heart-healthy diet. Work with a diet and nutrition specialist (dietitian) to create an eating plan that is best for you.  Keep all follow-up visits. This is important. Where to find more information  American Heart Association: www.heart.org Summary  A heart failure action plan helps you understand what to do when you have symptoms of heart failure.  Follow the action plan that was created by you and your health care provider.  Get help right away if you have any symptoms in the red zone. This information is not intended to replace advice given to you by your health care provider. Make sure you discuss any questions you have with your health care  provider. Document Revised: 10/07/2019 Document Reviewed: 10/07/2019 Elsevier Patient Education  2021 [employer].    The patient verbalized understanding of instructions, educational materials, and care plan provided today and agreed to receive a mailed copy of patient instructions, educational materials, and care plan.   Telephone follow up appointment with care management team member scheduled for: Wednesday September 16, 2020 at 2:30 PM  The patient has been provided with contact information for the care management team and has been advised to call with any health related questions or concerns.   Kristen Rack, RN, BSN, [facility] RN Care Coordination- [place] [phone]: direct office [phone]: mobile

## 2020-06-25 NOTE — Chronic Care Management (AMB) (Signed)
Chronic Care Management   CCM RN Visit Note  06/25/2020 Name: Kristen [name] MRN: [id number] DOB: March 02, 1946  Subjective: Kristen [name] is a 75 y.o. year old female who is a primary care patient of [name], MD. The care management team was consulted for assistance with disease management and care coordination needs.    Engaged with patient by telephone for follow up visit in response to provider referral for case management and/or care coordination services.   Consent to Services:  The patient was given information about Chronic Care Management services, agreed to services, and gave verbal consent prior to initiation of services.  Please see initial visit note for detailed documentation.  Patient agreed to services and verbal consent obtained.   Assessment: Review of patient past medical history, allergies, medications, health status, including review of consultants reports, laboratory and other test data, was performed as part of comprehensive evaluation and provision of chronic care management services.     CCM Care Plan  Allergies  Allergen Reactions  . Metformin And Related Other (See Comments)    Must take XR form only, cannot tolerate Regular release metformin  . Cleocin [Clindamycin Hcl] Diarrhea  . Codeine Itching  . Doxycycline Hyclate Diarrhea  . Macrolides And Ketolides Diarrhea  . Morphine Hives  . Pentazocine Lactate Itching and Nausea And Vomiting    (GENERIC- [place])  . Vibramycin [Doxycycline Calcium] Diarrhea and Rash  . Clindamycin Diarrhea  . Definity [Perflutren Lipid Microsphere] Other (See Comments)    Patient complained of warm sensation in chest x few seconds duration when injected Definity.No other symptoms  . Doxycycline Diarrhea  . Metformin Other (See Comments)  . Pentazocine Nausea Only  . Perflutren Other (See Comments)  . Perflutren Lipid Microspheres Other (See Comments)  . Sulfa Antibiotics Diarrhea and Rash  . Sulfonamide  Derivatives Rash    Outpatient Encounter Medications as of 06/25/2020  Medication Sig  . ALPRAZolam (XANAX) 1 MG tablet Take 1 tablet (1 mg total) by mouth 3 (three) times daily as needed for anxiety.  . Ascorbic Acid (VITAMIN C) 1000 MG tablet Take 1,000 mg by mouth daily.  Marland Kitchen atorvastatin (LIPITOR) 40 MG tablet TAKE ONE TABLET BY MOUTH ONCE DAILY  . Blood Glucose Monitoring Suppl (ONE TOUCH ULTRA 2) w/Device KIT Use as advised  . Cholecalciferol (VITAMIN D3) 50 MCG (2000 UT) capsule Take 2,000 Units by mouth daily.  . cyclobenzaprine (FLEXERIL) 10 MG tablet Take 10 mg by mouth 3 (three) times daily as needed for muscle spasms.  . empagliflozin (JARDIANCE) 25 MG TABS tablet Take 1 tablet (25 mg total) by mouth daily.  . furosemide (LASIX) 20 MG tablet Take 1 tablet (20 mg total) by mouth every morning.  . gabapentin (NEURONTIN) 300 MG capsule TAKE ONE CAPSULE BY MOUTH THREE TIMES DAILY  . glucose blood (COOL BLOOD GLUCOSE TEST STRIPS) test strip Use to test blood sugar twice daily. DX: E11.9  . insulin glargine, 2 Unit Dial, (TOUJEO MAX SOLOSTAR) 300 UNIT/ML Solostar Pen Inject 50 Units into the skin daily.  . Insulin Pen Needle (NOVOFINE) 32G X 6 MM MISC 1 Act by Does not apply route daily.  . Lancets (ONETOUCH ULTRASOFT) lancets Use as instructed  . magnesium oxide (MAG-OX) 400 MG tablet Take 1 tablet (400 mg total) by mouth 2 (two) times daily. WITH BREAKFAST AND EVENING MEALS.  . metFORMIN (GLUCOPHAGE-XR) 500 MG 24 hr tablet TAKE TWO TABLETS BY MOUTH TWICE DAILY WITH FOOD  . metoprolol succinate (TOPROL-XL)  100 MG 24 hr tablet Take 1 tablet (100 mg total) by mouth 2 (two) times daily. Take with or immediately following a meal.  . Multiple Vitamin (MULTIVITAMIN) tablet Take 1 tablet by mouth daily.  . nitroGLYCERIN (NITROSTAT) 0.4 MG SL tablet Place 1 tablet (0.4 mg total) under the tongue every 5 (five) minutes as needed for chest pain (x 3 doses). Reported on 05/08/2015  . ondansetron  (ZOFRAN-ODT) 4 MG disintegrating tablet Take 1 tablet (4 mg total) by mouth every 8 (eight) hours as needed for nausea or vomiting.  Marland Kitchen PERCOCET 10-325 MG tablet Take 1 tablet by mouth 5 (five) times daily as needed.  . potassium chloride SA (KLOR-CON) 20 MEQ tablet Take 1 tablet (20 mEq total) by mouth 2 (two) times daily.  . Semaglutide, 1 MG/DOSE, (OZEMPIC, 1 MG/DOSE,) 2 MG/1.5ML SOPN Inject 2 mg into the skin once a week.  . spironolactone (ALDACTONE) 25 MG tablet TAKE ONE TABLET BY MOUTH TWICE DAILY  . telmisartan (MICARDIS) 40 MG tablet TAKE ONE TABLET BY MOUTH ONCE DAILY  . thiamine 100 MG tablet Take 1 tablet (100 mg total) by mouth daily.  Marland Kitchen triamcinolone cream (KENALOG) 0.5 % Apply 1 application topically 3 (three) times daily.  Marland Kitchen warfarin (COUMADIN) 3 MG tablet TAKE ONE TABLET BY MOUTH ONCE DAILY OR USE AS DIRECTED by clinic   No facility-administered encounter medications on file as of 06/25/2020.    Patient Active Problem List   Diagnosis Date Noted  . Poor dentition requiring referral to dentistry 03/26/2020  . Thiamine deficiency 12/30/2018  . Vitamin B12 deficiency anemia due to intrinsic factor deficiency 12/20/2018  . CHF (congestive heart failure), NYHA class I, chronic, combined ([place]) 12/19/2018  . Coronary artery disease involving native coronary artery of native heart with angina pectoris ([place]) 12/19/2018  . Obesity with body mass index (BMI) of 30.0 to 39.9 01/15/2018  . Chronic idiopathic constipation 12/27/2017  . Atrial fibrillation ([place]) 10/30/2017  . Type 2 diabetes mellitus ([place]) 10/30/2017  . Eczema 08/07/2017  . Insomnia secondary to chronic pain 08/04/2016  . Visit for screening mammogram 04/14/2016  . LV dysfunction   . Routine general medical examination at a health care facility 05/03/2013  . DM (diabetes mellitus), type 2 with peripheral vascular complications ([place]) 78/58/8502  . PVC's/nonsustained VT 10/24/2012  . H/O: CVA (cerebrovascular accident)  02/09/2012  . Hypothyroidism 05/27/2011  . Fibromyalgia 10/08/2010  . Long term current use of anticoagulant 05/05/2010  . Low back pain, non-specific 05/12/2008  . Cognitive dysfunction associated with depression 01/29/2007  . Osteoarthritis 11/22/2006  . Hyperlipidemia associated with type 2 diabetes mellitus ([place]) 09/13/2006  . Coronary atherosclerosis 09/13/2006  . Paroxysmal atrial fibrillation ([place]) 09/13/2006  . GERD 09/13/2006  . Hypertension associated with diabetes ([place]) 08/30/2006    Conditions to be addressed/monitored:CHF and DMII  Care Plan : Diabetes Type 2 (Adult)  Updates made by Knox Royalty, RN since 06/25/2020 12:00 AM    Problem: Glycemic Management (Diabetes, Type 2)   Priority: High    Long-Range Goal: Glycemic Management Optimized   Start Date: 05/01/2020  Expected End Date: 10/29/2020  This Visit's Progress: On track  Recent Progress: On track  Priority: High  Note:   Objective:  Lab Results  Component Value Date   HGBA1C 8.0 (A) 03/26/2020 .   Lab Results  Component Value Date   CREATININE 0.90 10/16/2019   CREATININE 0.86 10/10/2019   CREATININE 0.86 04/11/2019 .   Marland Kitchen No results found for:  EGFR Current Barriers:  Marland Kitchen Knowledge Deficits related to basic Diabetes pathophysiology, self care/management, and appropriate diet for self-management of DM . Difficulty obtaining or cannot afford medications- confirmed CCM Pharmacist [name] currently active for PAP and medication optimization . Unable to independently verbalize appropriate choices for DM soft diet  . Does not adhere to provider recommendations re: following low carb/ sugar diet Case Manager Clinical Goal(s):  Over the next 6 months, patient will demonstrate improved adherence to prescribed treatment plan for diabetes self care/management as evidenced by:  . daily monitoring and recording of CBG- on track . adherence to ADA/ carb modified diet- on track, continues to require education/  reinforcement  . contacting provider for new or worsened symptoms or questions- denies today Interventions:  . Review of patient status, including review of consultants reports, relevant laboratory and other test results, and medications completed. . Collaboration with [name], MD regarding development and update of comprehensive plan of care as evidenced by provider attestation and co-signature . Inter-disciplinary care team collaboration (see longitudinal plan of care) . Chart reviewed including relevant office notes, upcoming scheduled appointments, and lab results . Discussed current clinical condition and confirmed no current clinical concerns . Confirmed patient received Ozempic from patient assistance program- positive reinforcement provided . Discussed with patient her report that she has intermittently had episodes of forgetting whether or not she took her medications: she reports unable to use pill box due to it not holding all of her medications in the individual slots; discussed possibility of obtaining new pill box with larger slots- she will look to see if she can find; encouraged her to discuss this issue with CCM Pharmacist [name] during scheduled call with [name] on 07/06/20- she said she will try to remember: I will message [name] as well as an Pharmacist, hospital . Reviewed recent blood sugars with patient: reports fasting today of "117" with 2-hours post-prandial at "121" however she reports ongoing episodes of high blood sugars > 200 during post-prandial periods; states these blood sugars are generally ranging between 140-240; she is not clear in verbalizing her fasting blood sugar trends at home; she is however monitoring and recording blood sugars at home . Confirmed she spoke with care guide about dental/ financial resources: she has slowly started efforts to have dental work completed- positive reinforcement provided . Reminded patient that next A1-C level due in June 2022- encouraged  her to schedule PCP appointment for updated A1-C; she is also interested in receiving BMET for kidney function: historical values of previous kidney function ranges discussed with patient and I re-assured her that her previous ranges have been WNL . Discussed ongoing challenges of following diabetic-appropriate diet, given ongoing dental issues- encouraged patient to continue her efforts and to frequently re-review previously provided education around diet choices, need to gradually try adding new food options from list provided to her, and need to promptly address dental issues . Discussed ongoing need for annual eye and foot exams: encouraged patient to schedule these appointments soon, she is agreeable . Reviewed upcoming provider appointments with patient and confirmed that patient has plans to attend all as scheduled: CCM CSW call 07/01/20 and CCM Pharmacist call 07/07/20 . Discussed plans with patient for ongoing care management follow up and provided patient with direct contact information for care management team Patient Goals: . Continue checking blood sugar twice daily and begin writing down these values from home monitoring: check first thing in the morning before you eat and again 2 hours after a  large meal . Check blood sugar if I feel it is too high or too low . Obtain a dedicated notebook or piece of paper to write down blood sugars so we can review these during our future phone calls  . Take the blood sugar log to all doctor visits- this will help the doctor make decisions about your treatment plan and medications . I am glad to hear that you have received your medication assistance program for Ozempic, please stay in touch with the pharmacist that is helping you with this [name] and her team) . I am glad you spoke with the care guide about financial resources, including affordable dental providers-- please continue your efforts to make appointments with these providers as soon as you are  able; once you get your dental work completed, it should help you have better toleration of your diet and better control over your diabetes . Don't forget to schedule your next appointment with Dr. Ronnald Ramp for an updated A1-C; your next A1-C should be updated in June 2022 . Contact your care providers if you have any questions or concerns about your blood sugar values at home or you medications . Frequently review the information you were sent about choices of food that are good to eat when you have diabetes, continue your efforts to begin making small changes to your soft diet to decrease you sugar and carbohydrate intake at home: we will talk about this during our future phone calls . Make an appointment with your podiatry provider as we discussed . Make an appointment for your annual eye exam Self-Care Activities . UNABLE to independently verbalize appropriate dietary choices for DM in setting of poor dentition/ soft diet: patient will continue to require reinforcement . Attends all scheduled provider appointments Follow Up Plan:  . Telephone follow up appointment with care management team member scheduled for: Wednesday September 16, 2020 at 2:30 pm . The patient has been provided with contact information for the care management team and has been advised to call with any health related questions or concerns.     Care Plan : Heart Failure (Adult)  Updates made by Knox Royalty, RN since 06/25/2020 12:00 AM    Problem: Symptom Exacerbation (Heart Failure)   Priority: Medium    Long-Range Goal: Symptom Exacerbation Prevented or Minimized   Start Date: 05/01/2020  Expected End Date: 10/29/2020  This Visit's Progress: On track  Recent Progress: On track  Priority: Medium  Note:   Current Barriers:  Marland Kitchen Knowledge deficit related to basic heart failure pathophysiology and self care management- needs ongoing reinforcement . Unable to independently verbalize comprehensive signs/ symptoms of HF  exacerbation Case Manager Clinical Goal(s):  Over the next 6 months patient will: Marland Kitchen Verbalize understanding of symptoms associated with Heart Failure and follow Action Plan and when to call doctor- on track . Continue monitoring/ recording daily weight daily (notifying MD of 3 lb weight gain over night or 5 lb in a week)- on track Interventions:  . Collaboration with [name], MD regarding development and update of comprehensive plan of care as evidenced by provider attestation and co-signature . Inter-disciplinary care team collaboration (see longitudinal plan of care) . Chart reviewed including relevant office notes, upcoming scheduled appointments, and lab results . Discussed current  clinical condition with patient and confirmed no current clinical concerns; confirmed patient continues to monitor/ record daily weights at home . Reviewed recent weights at home with patient: she reports consistent ranges between 208-210 lbs, consistent  with previously reported ranges . Confirmed she continues to follow low-salt diet- positive reinforcement provided . Briefly reviewed signs/ symptoms CHF zones along with corresponding action plan: patient will need ongoing reinforcement of this conceptual plan of care . Reinforced basic action plan for weight gain in setting of CHF with patient: she reports ongoing need for reinforcement/ reminding, however today is able to readily verbalize Patient Goals/Self-Care Activities . Continue to weigh myself daily and track weight in diary or on a dedicated calendar: we will review your weights during our future phone calls . You reported today general weight ranges between 208-210: this is right in the range you reported last month: keep up the great work keeping your daily weights in range . Call cardiologist/ care providers if I gain more than 2 pounds in one day or 5 pounds in one week . Use salt in moderation . Watch for swelling in feet, ankles and legs  every day . Please keep your cardiologist updated if you develop concerns or have changes in your clinical status . Today, you reported weights at home consistently between 208- 210 lbs Follow Up Plan:  . Telephone follow up appointment with care management team member scheduled for: Wednesday September 16, 2020 at 2:30 pm . The patient has been provided with contact information for the care management team and has been advised to call with any health related questions or concerns.      Plan:  Telephone follow up appointment with care management team member scheduled for:  Wednesday September 16, 2020 at 2:30 pm  The patient has been provided with contact information for the care management team and has been advised to call with any health related questions or concerns.   [name], RN, BSN, [facility] RN Care Coordination- [place] [phone]: direct office [phone]: mobile

## 2020-06-29 ENCOUNTER — Telehealth: Payer: Self-pay | Admitting: Pharmacist

## 2020-06-29 NOTE — Progress Notes (Signed)
Chronic Care Management Pharmacy Assistant   Name: Kristen [name]  MRN: [id number] DOB: [birth year]   Reason for Encounter: Medication Coordination Call    Recent office visits:  None ID  Recent consult visits:  None ID  Hospital visits:  None in previous 6 months  Medications: Outpatient Encounter Medications as of 06/29/2020  Medication Sig  . ALPRAZolam (XANAX) 1 MG tablet Take 1 tablet (1 mg total) by mouth 3 (three) times daily as needed for anxiety.  . Ascorbic Acid (VITAMIN C) 1000 MG tablet Take 1,000 mg by mouth daily.  Marland Kitchen atorvastatin (LIPITOR) 40 MG tablet TAKE ONE TABLET BY MOUTH ONCE DAILY  . Blood Glucose Monitoring Suppl (ONE TOUCH ULTRA 2) w/Device KIT Use as advised  . Cholecalciferol (VITAMIN D3) 50 MCG (2000 UT) capsule Take 2,000 Units by mouth daily.  . cyclobenzaprine (FLEXERIL) 10 MG tablet Take 10 mg by mouth 3 (three) times daily as needed for muscle spasms.  . empagliflozin (JARDIANCE) 25 MG TABS tablet Take 1 tablet (25 mg total) by mouth daily.  . furosemide (LASIX) 20 MG tablet Take 1 tablet (20 mg total) by mouth every morning.  . gabapentin (NEURONTIN) 300 MG capsule TAKE ONE CAPSULE BY MOUTH THREE TIMES DAILY  . glucose blood (COOL BLOOD GLUCOSE TEST STRIPS) test strip Use to test blood sugar twice daily. DX: E11.9  . insulin glargine, 2 Unit Dial, (TOUJEO MAX SOLOSTAR) 300 UNIT/ML Solostar Pen Inject 50 Units into the skin daily.  . Insulin Pen Needle (NOVOFINE) 32G X 6 MM MISC 1 Act by Does not apply route daily.  . Lancets (ONETOUCH ULTRASOFT) lancets Use as instructed  . magnesium oxide (MAG-OX) 400 MG tablet Take 1 tablet (400 mg total) by mouth 2 (two) times daily. WITH BREAKFAST AND EVENING MEALS.  . metFORMIN (GLUCOPHAGE-XR) 500 MG 24 hr tablet TAKE TWO TABLETS BY MOUTH TWICE DAILY WITH FOOD  . metoprolol succinate (TOPROL-XL) 100 MG 24 hr tablet Take 1 tablet (100 mg total) by mouth 2 (two) times daily. Take with or immediately  following a meal.  . Multiple Vitamin (MULTIVITAMIN) tablet Take 1 tablet by mouth daily.  . nitroGLYCERIN (NITROSTAT) 0.4 MG SL tablet Place 1 tablet (0.4 mg total) under the tongue every 5 (five) minutes as needed for chest pain (x 3 doses). Reported on 05/08/2015  . ondansetron (ZOFRAN-ODT) 4 MG disintegrating tablet Take 1 tablet (4 mg total) by mouth every 8 (eight) hours as needed for nausea or vomiting.  Marland Kitchen PERCOCET 10-325 MG tablet Take 1 tablet by mouth 5 (five) times daily as needed.  . potassium chloride SA (KLOR-CON) 20 MEQ tablet Take 1 tablet (20 mEq total) by mouth 2 (two) times daily.  . Semaglutide, 1 MG/DOSE, (OZEMPIC, 1 MG/DOSE,) 2 MG/1.5ML SOPN Inject 2 mg into the skin once a week.  . spironolactone (ALDACTONE) 25 MG tablet TAKE ONE TABLET BY MOUTH TWICE DAILY  . telmisartan (MICARDIS) 40 MG tablet TAKE ONE TABLET BY MOUTH ONCE DAILY  . thiamine 100 MG tablet Take 1 tablet (100 mg total) by mouth daily.  Marland Kitchen triamcinolone cream (KENALOG) 0.5 % Apply 1 application topically 3 (three) times daily.  Marland Kitchen warfarin (COUMADIN) 3 MG tablet TAKE ONE TABLET BY MOUTH ONCE DAILY OR USE AS DIRECTED by clinic   No facility-administered encounter medications on file as of 06/29/2020.    Received call from patient regarding medication management via [facility].  Patient requested an acute fill for Warfarin to be delivered: 06/29/20 Pharmacy  needs refills? No  Confirmed delivery date of 06/29/20, advised patient that pharmacy will contact them the morning of delivery.   [place] Pharmacist Assistant [phone]  Time spent:35

## 2020-06-30 DIAGNOSIS — Z79891 Long term (current) use of opiate analgesic: Secondary | ICD-10-CM | POA: Diagnosis not present

## 2020-06-30 DIAGNOSIS — M65312 Trigger thumb, left thumb: Secondary | ICD-10-CM | POA: Diagnosis not present

## 2020-06-30 DIAGNOSIS — M150 Primary generalized (osteo)arthritis: Secondary | ICD-10-CM | POA: Diagnosis not present

## 2020-06-30 DIAGNOSIS — G894 Chronic pain syndrome: Secondary | ICD-10-CM | POA: Diagnosis not present

## 2020-07-01 ENCOUNTER — Ambulatory Visit: Payer: Medicare Other | Admitting: *Deleted

## 2020-07-01 ENCOUNTER — Other Ambulatory Visit: Payer: Self-pay | Admitting: Internal Medicine

## 2020-07-01 DIAGNOSIS — M797 Fibromyalgia: Secondary | ICD-10-CM

## 2020-07-01 DIAGNOSIS — Z7901 Long term (current) use of anticoagulants: Secondary | ICD-10-CM

## 2020-07-01 DIAGNOSIS — F32A Depression, unspecified: Secondary | ICD-10-CM

## 2020-07-01 DIAGNOSIS — Z794 Long term (current) use of insulin: Secondary | ICD-10-CM | POA: Diagnosis not present

## 2020-07-01 DIAGNOSIS — E1149 Type 2 diabetes mellitus with other diabetic neurological complication: Secondary | ICD-10-CM

## 2020-07-01 DIAGNOSIS — I5042 Chronic combined systolic (congestive) and diastolic (congestive) heart failure: Secondary | ICD-10-CM

## 2020-07-01 DIAGNOSIS — E1151 Type 2 diabetes mellitus with diabetic peripheral angiopathy without gangrene: Secondary | ICD-10-CM | POA: Diagnosis not present

## 2020-07-02 NOTE — Chronic Care Management (AMB) (Signed)
Chronic Care Management    Clinical Social Work Note  07/02/2020 Name: Kristen [name] MRN: [id number] DOB: 1945/05/07  Kristen [name] [name] is a 75 y.o. year old female who is a primary care patient of [name], MD. The CCM team was consulted to assist the patient with chronic disease management and/or care coordination needs related to: Mental Health Counseling and Resources.  Engaged with patient by telephone for follow up visit in response to provider referral for social work chronic care management and care coordination services.  Consent to Services:  The patient was given information about Chronic Care Management services, agreed to services, and gave verbal consent prior to initiation of services.  Please see initial visit note for detailed documentation.  Patient agreed to services and consent obtained.  Assessment: Review of patient past medical history, allergies, medications, and health status, including review of relevant consultants reports was performed today as part of a comprehensive evaluation and provision of chronic care management and care coordination services.    SDOH (Social Determinants of Health) assessments and interventions performed:   Advanced Directives Status: Not addressed in this encounter.  CCM Care Plan  Allergies  Allergen Reactions  . Metformin And Related Other (See Comments)    Must take XR form only, cannot tolerate Regular release metformin  . Cleocin [Clindamycin Hcl] Diarrhea  . Codeine Itching  . Doxycycline Hyclate Diarrhea  . Macrolides And Ketolides Diarrhea  . Morphine Hives  . Pentazocine Lactate Itching and Nausea And Vomiting    (GENERIC- [place])  . Vibramycin [Doxycycline Calcium] Diarrhea and Rash  . Clindamycin Diarrhea  . Definity [Perflutren Lipid Microsphere] Other (See Comments)    Patient complained of warm sensation in chest x few seconds duration when injected Definity.No other symptoms  . Doxycycline Diarrhea   . Metformin Other (See Comments)  . Pentazocine Nausea Only  . Perflutren Other (See Comments)  . Perflutren Lipid Microspheres Other (See Comments)  . Sulfa Antibiotics Diarrhea and Rash  . Sulfonamide Derivatives Rash    Outpatient Encounter Medications as of 07/01/2020  Medication Sig  . ALPRAZolam (XANAX) 1 MG tablet Take 1 tablet (1 mg total) by mouth 3 (three) times daily as needed for anxiety.  . Ascorbic Acid (VITAMIN C) 1000 MG tablet Take 1,000 mg by mouth daily.  Marland Kitchen atorvastatin (LIPITOR) 40 MG tablet TAKE ONE TABLET BY MOUTH ONCE DAILY  . Blood Glucose Monitoring Suppl (ONE TOUCH ULTRA 2) w/Device KIT Use as advised  . Cholecalciferol (VITAMIN D3) 50 MCG (2000 UT) capsule Take 2,000 Units by mouth daily.  . cyclobenzaprine (FLEXERIL) 10 MG tablet Take 10 mg by mouth 3 (three) times daily as needed for muscle spasms.  . empagliflozin (JARDIANCE) 25 MG TABS tablet Take 1 tablet (25 mg total) by mouth daily.  . furosemide (LASIX) 20 MG tablet Take 1 tablet (20 mg total) by mouth every morning.  . gabapentin (NEURONTIN) 300 MG capsule TAKE ONE CAPSULE BY MOUTH THREE TIMES DAILY  . glucose blood (COOL BLOOD GLUCOSE TEST STRIPS) test strip Use to test blood sugar twice daily. DX: E11.9  . insulin glargine, 2 Unit Dial, (TOUJEO MAX SOLOSTAR) 300 UNIT/ML Solostar Pen Inject 50 Units into the skin daily.  . Insulin Pen Needle (NOVOFINE) 32G X 6 MM MISC 1 Act by Does not apply route daily.  . Lancets (ONETOUCH ULTRASOFT) lancets Use as instructed  . magnesium oxide (MAG-OX) 400 MG tablet Take 1 tablet (400 mg total) by mouth 2 (two) times daily.  WITH BREAKFAST AND EVENING MEALS.  . metFORMIN (GLUCOPHAGE-XR) 500 MG 24 hr tablet TAKE TWO TABLETS BY MOUTH TWICE DAILY WITH FOOD  . metoprolol succinate (TOPROL-XL) 100 MG 24 hr tablet Take 1 tablet (100 mg total) by mouth 2 (two) times daily. Take with or immediately following a meal.  . Multiple Vitamin (MULTIVITAMIN) tablet Take 1 tablet by  mouth daily.  . nitroGLYCERIN (NITROSTAT) 0.4 MG SL tablet Place 1 tablet (0.4 mg total) under the tongue every 5 (five) minutes as needed for chest pain (x 3 doses). Reported on 05/08/2015  . ondansetron (ZOFRAN-ODT) 4 MG disintegrating tablet Take 1 tablet (4 mg total) by mouth every 8 (eight) hours as needed for nausea or vomiting.  Marland Kitchen PERCOCET 10-325 MG tablet Take 1 tablet by mouth 5 (five) times daily as needed.  . potassium chloride SA (KLOR-CON) 20 MEQ tablet Take 1 tablet (20 mEq total) by mouth 2 (two) times daily.  . Semaglutide, 1 MG/DOSE, (OZEMPIC, 1 MG/DOSE,) 2 MG/1.5ML SOPN Inject 2 mg into the skin once a week.  . spironolactone (ALDACTONE) 25 MG tablet TAKE ONE TABLET BY MOUTH TWICE DAILY  . telmisartan (MICARDIS) 40 MG tablet TAKE ONE TABLET BY MOUTH ONCE DAILY  . thiamine 100 MG tablet Take 1 tablet (100 mg total) by mouth daily.  Marland Kitchen triamcinolone cream (KENALOG) 0.5 % Apply 1 application topically 3 (three) times daily.  Marland Kitchen warfarin (COUMADIN) 3 MG tablet TAKE ONE TABLET BY MOUTH AS DIRECTED by clinic   No facility-administered encounter medications on file as of 07/01/2020.    Patient Active Problem List   Diagnosis Date Noted  . Poor dentition requiring referral to dentistry 03/26/2020  . Thiamine deficiency 12/30/2018  . Vitamin B12 deficiency anemia due to intrinsic factor deficiency 12/20/2018  . CHF (congestive heart failure), NYHA class I, chronic, combined ([place]) 12/19/2018  . Coronary artery disease involving native coronary artery of native heart with angina pectoris ([place]) 12/19/2018  . Obesity with body mass index (BMI) of 30.0 to 39.9 01/15/2018  . Chronic idiopathic constipation 12/27/2017  . Atrial fibrillation ([place]) 10/30/2017  . Type 2 diabetes mellitus ([place]) 10/30/2017  . Eczema 08/07/2017  . Insomnia secondary to chronic pain 08/04/2016  . Visit for screening mammogram 04/14/2016  . LV dysfunction   . Routine general medical examination at a health care  facility 05/03/2013  . DM (diabetes mellitus), type 2 with peripheral vascular complications ([place]) 80/16/5537  . PVC's/nonsustained VT 10/24/2012  . H/O: CVA (cerebrovascular accident) 02/09/2012  . Hypothyroidism 05/27/2011  . Fibromyalgia 10/08/2010  . Long term current use of anticoagulant 05/05/2010  . Low back pain, non-specific 05/12/2008  . Cognitive dysfunction associated with depression 01/29/2007  . Osteoarthritis 11/22/2006  . Hyperlipidemia associated with type 2 diabetes mellitus ([place]) 09/13/2006  . Coronary atherosclerosis 09/13/2006  . Paroxysmal atrial fibrillation ([place]) 09/13/2006  . GERD 09/13/2006  . Hypertension associated with diabetes ([place]) 08/30/2006    Conditions to be addressed/monitored: Anxiety and Depression; Mental Health Concerns   Care Plan : LCSW Plan of Care  Updates made by Deirdre Peer, LCSW since 07/02/2020 12:00 AM    Problem: Lacks strategy for management of anxiety around sleeping and fear of dying.   Priority: High    Long-Range Goal: Establish and Maintain plan for anxiety management   Start Date: 04/09/2020  Expected End Date: 09/03/2020  This Visit's Progress: On track  Recent Progress: On track  Priority: High  Note:   Current Barriers:  . Pt does  not feel she needs therapy/counseling; stating, "I have been going through a lot of this for 52 years" . Pt acknowledges grief from the loss of her grandson- appears to be coping in a normal/healthy manner . Chronic Mental Health needs related to anxiety/PTSD - has participated in an initial visit with [facility] clinician and was not set up for follow up. Pt has decided to pause and does not plan on re-scheduling and/or connecting with new mental health therapist at this time. . Limited social support, Mental Health Concerns , and Family and relationship dysfunction . Suicidal Ideation/Homicidal Ideation: No . Pt shares she is a bit overwhelmed by the pile of mail she has to go through and  concerns for disposing of the personal documents-she continues to work on preparing them for shredding/trash Clinical Social Work Goal(s):  Marland Kitchen Over the next 30 days pt will begin to go through the stack of mail and organize a pile of papers that need to be shredded for privacy/confidentiality and those that can just be recycled or trashed.  . Pt will continue to focus on positive self talk and thought patterns, not over-reacting, and seeking ways to better cope with uncomfortable feelings as they arise . Over the next 90 days, patient will work with SW monthly by telephone to reduce or manage symptoms related to anxiety Interventions: . Pt assessed by phone and reports she is doing "good". Pt voices that she does not feel she needs to pursue counseling. "I think I am managing pretty well".  Pt feels her depression is better and denies the need for counseling; as well as declines Grief counseling at this time.  CSW validated pt and her choice and her right to make this decision. CSW talked with pt about her insight to her self and her needs and encouraged her to continue to assess and discuss with Dr Casimiro Needle further as she wishes..  . CSW shared with pt about a possible opportunity for her to participate in a 'free-shed' day where she can take her mail/papers that need to be shredded for privacy purposes and she plans to begin going through the pile(s) and organizing a box for such a shredding event.  . Motivational Interviewing . Solution-Focused Strategies . Mindfulness or Relaxation Training . Brief CBT . Grief Counseling . Emotional/Supportive Counseling . Psychoeducation and/or Health Education . Problem Solving Patient Coping Strengths:  . Spirituality . Hopefulness . Self Advocate . Able to Communicate Effectively . Rapport and trust with her Psychiatrist and PCP Patient Self Care Deficits:  . Lacks social connections Patient Goals:   - Continue with visits with  Dr Casimiro Needle and further  discuss with him about resuming therapy if you feel needed. - practice relaxation or meditation  -continue with positive self talk, "don't sweat the small stuff", and allowing yourself to be more aware of your emotions and actions that come - reach out to CSW and/or Dr Casimiro Needle if needs arise and want to pursue counseling appointment before scheduled visit with Dr Casimiro Needle  Follow Up Plan: Appointment scheduled for SW follow up with client by phone on:  07/31/2020 at 2pm.      Follow Up Plan: Appointment scheduled for SW follow up with client by phone on: 07/31/2020      Eduard Clos MSW, [place] Licensed Clinical Social Worker [place] [phone]

## 2020-07-02 NOTE — Patient Instructions (Signed)
Visit Information  PATIENT GOALS: Goals Addressed            This Visit's Progress   . Work on managing my worry and fear       Timeframe:  Long-Range Goal Priority:  High Start Date:    04/09/2020                         Expected End Date:  09/03/2020              Follow Up Date 07/31/2020   - Continue with visits with  Dr Casimiro Needle and further discuss with him about resuming therapy if you feel needed. - practice relaxation or meditation  -continue with positive self talk, "don't sweat the small stuff", and allowing yourself to be more aware of your emotions and actions that come - reach out to CSW and/or Dr Casimiro Needle if needs arise and want to pursue counseling appointment before scheduled visit with Dr Casimiro Needle      Why is this important?    When you are stressed, down or upset, your body reacts too.   For example, your blood pressure may get higher; you may have a headache or stomachache.   When your emotions get the best of you, your body's ability to fight off cold and flu gets weak.   These steps will help you manage your emotions.     Notes:        Patient verbalizes understanding of instructions provided today and agrees to view in [place].   Face to Face appointment with care management team member scheduled for: 07/31/2020  Eduard Clos MSW, LCSW Licensed Clinical Social Worker [place] [phone]

## 2020-07-06 ENCOUNTER — Ambulatory Visit (INDEPENDENT_AMBULATORY_CARE_PROVIDER_SITE_OTHER): Payer: Medicare Other | Admitting: Pharmacist

## 2020-07-06 ENCOUNTER — Other Ambulatory Visit: Payer: Self-pay

## 2020-07-06 DIAGNOSIS — E1151 Type 2 diabetes mellitus with diabetic peripheral angiopathy without gangrene: Secondary | ICD-10-CM | POA: Diagnosis not present

## 2020-07-06 DIAGNOSIS — E1159 Type 2 diabetes mellitus with other circulatory complications: Secondary | ICD-10-CM | POA: Diagnosis not present

## 2020-07-06 DIAGNOSIS — I152 Hypertension secondary to endocrine disorders: Secondary | ICD-10-CM

## 2020-07-06 DIAGNOSIS — F32A Depression, unspecified: Secondary | ICD-10-CM | POA: Diagnosis not present

## 2020-07-06 DIAGNOSIS — I480 Paroxysmal atrial fibrillation: Secondary | ICD-10-CM | POA: Diagnosis not present

## 2020-07-06 DIAGNOSIS — E785 Hyperlipidemia, unspecified: Secondary | ICD-10-CM | POA: Diagnosis not present

## 2020-07-06 DIAGNOSIS — I5042 Chronic combined systolic (congestive) and diastolic (congestive) heart failure: Secondary | ICD-10-CM | POA: Diagnosis not present

## 2020-07-06 DIAGNOSIS — I25119 Atherosclerotic heart disease of native coronary artery with unspecified angina pectoris: Secondary | ICD-10-CM | POA: Diagnosis not present

## 2020-07-06 DIAGNOSIS — E1169 Type 2 diabetes mellitus with other specified complication: Secondary | ICD-10-CM

## 2020-07-06 MED ORDER — SPIRONOLACTONE 25 MG PO TABS: 25.0000 mg | ORAL_TABLET | Freq: Every day | ORAL | 0 refills | Status: DC

## 2020-07-06 NOTE — Patient Instructions (Signed)
Visit Information  Phone number for Pharmacist: [phone]  Goals Addressed            This Visit's Progress   . Manage My Medicine       Timeframe:  Long-Range Goal Priority:  High Start Date:      07/06/20                       Expected End Date:   01/06/21                    Follow Up Date 11/03/20   - call for medicine refill 2 or 3 days before it runs out - call if I am sick and can't take my medicine - keep a list of all the medicines I take; vitamins and herbals too  -Utilize [facility] for medication synchronization, packaging and delivery   Why is this important?   . These steps will help you keep on track with your medicines.   Notes:        Patient verbalizes understanding of instructions provided today and agrees to view in [place].  Telephone follow up appointment with pharmacy team member scheduled for: 3 months  Charlene Brooke, PharmD, [place], CPP Clinical Pharmacist [facility] [phone]

## 2020-07-06 NOTE — Progress Notes (Signed)
Chronic Care Management Pharmacy Note  07/06/2020 Name:  Kristen [name] MRN:  [id number] DOB:  [birth year]  Subjective: Kristen [name] is an 75 y.o. year old female who is a primary patient of [name], MD.  The CCM team was consulted for assistance with disease management and care coordination needs.    Engaged with patient by telephone for follow up visit in response to provider referral for pharmacy case management and/or care coordination services.   Consent to Services:  The patient was given information about Chronic Care Management services, agreed to services, and gave verbal consent prior to initiation of services.  Please see initial visit note for detailed documentation.   Patient Care Team: [name], MD as PCP - General (Internal Medicine) Sherren Mocha, MD as PCP - Cardiology (Cardiology) Deboraha Sprang, MD as Consulting Physician (Cardiology) Charlton Haws, [facility] as Pharmacist (Pharmacist) Shon Hough, MD as Consulting Physician (Ophthalmology) Deirdre Peer, LCSW as Social Worker (Licensed Clinical Social Worker) Knox Royalty, RN as Case Manager  Recent office visits: 03/26/20 Dr Ronnald Ramp OV: chronic f/u, A1c improved 8.0% but not at goal. Increased Ozempic to 2 mg  Recent consult visits: 05/13/20 Dr Burt Knack (cardiology): f/u CAD, Afib. Stable, no med changes.  Hospital visits: None in previous 6 months  Objective:  Lab Results  Component Value Date   CREATININE 0.90 10/16/2019   BUN 18 10/16/2019   GFR 78.64 04/11/2019   GFRNONAA 64 10/16/2019   GFRAA 75 10/16/2019   NA 139 10/16/2019   K 4.3 10/16/2019   CALCIUM 10.2 10/16/2019   CO2 21 10/16/2019   GLUCOSE 122 (H) 10/16/2019    Lab Results  Component Value Date/Time   HGBA1C 8.0 (A) 03/26/2020 02:32 PM   HGBA1C 8.3 (H) 10/10/2019 03:46 PM   HGBA1C 9.0 (A) 04/11/2019 02:39 PM   HGBA1C 8.4 (H) 08/07/2017 02:52 PM   GFR 78.64 04/11/2019 02:55 PM   GFR 65.38  12/19/2018 05:02 PM   MICROALBUR <0.7 04/11/2019 02:55 PM   MICROALBUR <0.7 12/28/2017 03:51 PM    Last diabetic Eye exam:  Lab Results  Component Value Date/Time   HMDIABEYEEXA No Retinopathy 03/21/2019 12:00 AM    Last diabetic Foot exam:  Lab Results  Component Value Date/Time   HMDIABFOOTEX normal 04/02/2014 12:00 AM     Lab Results  Component Value Date   CHOL 126 04/11/2019   HDL 36.70 (L) 04/11/2019   LDLCALC 65 04/11/2019   LDLDIRECT 116.0 11/28/2008   TRIG 123.0 04/11/2019   CHOLHDL 3 04/11/2019    Hepatic Function Latest Ref Rng & Units 04/11/2019 12/19/2018 12/28/2017  Total Protein 6.0 - 8.3 g/dL 8.0 7.8 7.8  Albumin 3.5 - 5.2 g/dL 4.4 4.9 4.4  AST 0 - 37 U/L $Remo'12 15 8  'sDvIc$ ALT 0 - 35 U/L $Remo'13 12 10  'tsvpq$ Alk Phosphatase 39 - 117 U/L 53 47 62  Total Bilirubin 0.2 - 1.2 mg/dL 0.4 1.5(H) 0.8  Bilirubin, Direct 0.0 - 0.3 mg/dL 0.1 0.3 -    Lab Results  Component Value Date/Time   TSH 1.55 10/10/2019 03:46 PM   TSH 1.75 04/11/2019 02:55 PM   FREET4 0.73 09/27/2013 01:28 PM   FREET4 0.68 05/07/2012 09:44 AM    CBC Latest Ref Rng & Units 10/10/2019 04/11/2019 12/19/2018  WBC 3.8 - 10.8 Thousand/uL 6.0 6.5 6.7  Hemoglobin 11.7 - 15.5 g/dL 12.2 13.0 11.5(L)  Hematocrit 35.0 - 45.0 % 39.8 41.3 34.1(L)  Platelets 140 -  400 Thousand/uL 308 398.0 292.0    No results found for: VD25OH  Clinical ASCVD: Yes  The ASCVD Risk score Mikey Bussing DC Jr., et al., 2013) failed to calculate for the following reasons:   The valid total cholesterol range is 130 to 320 mg/dL    Depression screen [facility] 2/9 06/03/2020 04/09/2020 02/26/2020  Decreased Interest 2 1 0  Down, Depressed, Hopeless 0 1 0  PHQ - 2 Score 2 2 0  Altered sleeping 2 2 -  Tired, decreased energy - 1 -  Change in appetite 1 1 -  Feeling bad or failure about yourself  0 1 -  Trouble concentrating 1 1 -  Moving slowly or fidgety/restless 1 1 -  Suicidal thoughts 0 0 -  PHQ-9 Score 7 9 -  Difficult doing work/chores Not  difficult at all Somewhat difficult -  Some recent data might be hidden    Social History   Tobacco Use  Smoking Status Former Smoker  . Quit date: 03/07/2001  . Years since quitting: 19.3  Smokeless Tobacco Never Used   BP Readings from Last 3 Encounters:  05/13/20 110/72  03/26/20 138/70  11/19/19 104/66   Pulse Readings from Last 3 Encounters:  05/13/20 96  03/26/20 98  11/19/19 95   Wt Readings from Last 3 Encounters:  05/13/20 208 lb 6.4 oz (94.5 kg)  03/26/20 212 lb (96.2 kg)  11/19/19 210 lb (95.3 kg)   BMI Readings from Last 3 Encounters:  05/13/20 39.38 kg/m  03/26/20 40.06 kg/m  11/19/19 39.68 kg/m    Assessment/Interventions: Review of patient past medical history, allergies, medications, health status, including review of consultants reports, laboratory and other test data, was performed as part of comprehensive evaluation and provision of chronic care management services.   SDOH:  (Social Determinants of Health) assessments and interventions performed: Yes  SDOH Screenings   Alcohol Screen: Low Risk   . Last Alcohol Screening Score (AUDIT): 0  Depression (PHQ2-9): Medium Risk  . PHQ-2 Score: 7  Financial Resource Strain: High Risk  . Difficulty of Paying Living Expenses: Hard  Food Insecurity: No Food Insecurity  . Worried About Charity fundraiser in the Last Year: Never true  . Ran Out of Food in the Last Year: Never true  Housing: Low Risk   . Last Housing Risk Score: 0  Physical Activity: Inactive  . Days of Exercise per Week: 0 days  . Minutes of Exercise per Session: 0 min  Social Connections: Moderately Isolated  . Frequency of Communication with Friends and Family: More than three times a week  . Frequency of Social Gatherings with Friends and Family: Never  . Attends Religious Services: Never  . Active Member of Clubs or Organizations: No  . Attends Archivist Meetings: Never  . Marital Status: Married  Stress: Stress Concern  Present  . Feeling of Stress : To some extent  Tobacco Use: Medium Risk  . Smoking Tobacco Use: Former Smoker  . Smokeless Tobacco Use: Never Used  Transportation Needs: No Transportation Needs  . Lack of Transportation (Medical): No  . Lack of Transportation (Non-Medical): No    CCM Care Plan  Allergies  Allergen Reactions  . Metformin And Related Other (See Comments)    Must take XR form only, cannot tolerate Regular release metformin  . Cleocin [Clindamycin Hcl] Diarrhea  . Codeine Itching  . Doxycycline Hyclate Diarrhea  . Macrolides And Ketolides Diarrhea  . Morphine Hives  . Pentazocine Lactate Itching  and Nausea And Vomiting    (GENERIC- [place])  . Vibramycin [Doxycycline Calcium] Diarrhea and Rash  . Clindamycin Diarrhea  . Definity [Perflutren Lipid Microsphere] Other (See Comments)    Patient complained of warm sensation in chest x few seconds duration when injected Definity.No other symptoms  . Doxycycline Diarrhea  . Metformin Other (See Comments)  . Pentazocine Nausea Only  . Perflutren Other (See Comments)  . Perflutren Lipid Microspheres Other (See Comments)  . Sulfa Antibiotics Diarrhea and Rash  . Sulfonamide Derivatives Rash    Medications Reviewed Today    Reviewed by Knox Royalty, RN (Registered Nurse) on 06/25/20 at 1432  Med List Status: <None>  Medication Order Taking? Sig Documenting Provider Last Dose Status Informant  ALPRAZolam (XANAX) 1 MG tablet [id number] No Take 1 tablet (1 mg total) by mouth 3 (three) times daily as needed for anxiety. Norma Fredrickson, MD Taking Active   Ascorbic Acid (VITAMIN C) 1000 MG tablet [id number] No Take 1,000 mg by mouth daily. [provider] Taking Active   atorvastatin (LIPITOR) 40 MG tablet [id number]  TAKE ONE TABLET BY MOUTH ONCE DAILY [name], MD  Active   Blood Glucose Monitoring Suppl (ONE TOUCH ULTRA 2) w/Device KIT [id number] No Use as advised Philemon Kingdom, MD Taking Active    Cholecalciferol (VITAMIN D3) 50 MCG (2000 UT) capsule [id number] No Take 2,000 Units by mouth daily. [provider] Taking Active   cyclobenzaprine (FLEXERIL) 10 MG tablet [id number] No Take 10 mg by mouth 3 (three) times daily as needed for muscle spasms. [provider] Taking Active Self  empagliflozin (JARDIANCE) 25 MG TABS tablet [id number] No Take 1 tablet (25 mg total) by mouth daily. [name], MD Taking Active            Med Note Lanna Poche R   Wed May 13, 2020  3:07 PM)    furosemide (LASIX) 20 MG tablet [id number] No Take 1 tablet (20 mg total) by mouth every morning. [name], MD Taking Active   gabapentin (NEURONTIN) 300 MG capsule [id number] No TAKE ONE CAPSULE BY MOUTH THREE TIMES DAILY [name], MD Taking Active   glucose blood (COOL BLOOD GLUCOSE TEST STRIPS) test strip [id number] No Use to test blood sugar twice daily. DX: E11.9 [name], MD Taking Active   insulin glargine, 2 Unit Dial, (TOUJEO MAX SOLOSTAR) 300 UNIT/ML Solostar Pen [id number] No Inject 50 Units into the skin daily. [name], MD Taking Active   Insulin Pen Needle (NOVOFINE) 32G X 6 MM MISC [id number] No 1 Act by Does not apply route daily. [name], MD Taking Active   Lancets [facility] ULTRASOFT) lancets [id number] No Use as instructed [name], MD Taking Active   magnesium oxide (MAG-OX) 400 MG tablet [id number]  Take 1 tablet (400 mg total) by mouth 2 (two) times daily. WITH BREAKFAST AND EVENING MEALS. Sherren Mocha, MD  Active   metFORMIN (GLUCOPHAGE-XR) 500 MG 24 hr tablet [id number] No TAKE TWO TABLETS BY MOUTH TWICE DAILY WITH FOOD [name], MD Taking Active   metoprolol succinate (TOPROL-XL) 100 MG 24 hr tablet [id number] No Take 1 tablet (100 mg total) by mouth 2 (two) times daily. Take with or immediately following a meal. Sherren Mocha, MD Taking Active   Multiple Vitamin (MULTIVITAMIN) tablet [id number] No Take 1 tablet by mouth  daily. [provider] Taking Active   nitroGLYCERIN (NITROSTAT) 0.4 MG SL tablet [id number] No Place 1  tablet (0.4 mg total) under the tongue every 5 (five) minutes as needed for chest pain (x 3 doses). Reported on 05/08/2015 Sherren Mocha, MD Taking Active   ondansetron (ZOFRAN-ODT) 4 MG disintegrating tablet [id number] No Take 1 tablet (4 mg total) by mouth every 8 (eight) hours as needed for nausea or vomiting. [name], MD Taking Active   PERCOCET 10-325 MG tablet [id number] No Take 1 tablet by mouth 5 (five) times daily as needed. [provider] Taking Active   potassium chloride SA (KLOR-CON) 20 MEQ tablet [id number] No Take 1 tablet (20 mEq total) by mouth 2 (two) times daily. Sherren Mocha, MD Taking Active   Semaglutide, 1 MG/DOSE, (OZEMPIC, 1 MG/DOSE,) 2 MG/1.5ML SOPN [id number] No Inject 2 mg into the skin once a week. [name], MD Taking Active            Med Note Jackalyn Lombard May 13, 2020  3:08 PM)    spironolactone (ALDACTONE) 25 MG tablet [id number]  TAKE ONE TABLET BY MOUTH TWICE DAILY [name], MD  Active   telmisartan (MICARDIS) 40 MG tablet [id number]  TAKE ONE TABLET BY MOUTH ONCE DAILY [name], MD  Active   thiamine 100 MG tablet [id number] No Take 1 tablet (100 mg total) by mouth daily. [name], MD Taking Active   triamcinolone cream (KENALOG) 0.5 % [id number] No Apply 1 application topically 3 (three) times daily. [name], MD Taking Active   warfarin (COUMADIN) 3 MG tablet [id number] No TAKE ONE TABLET BY MOUTH ONCE DAILY OR USE AS DIRECTED by clinic [name], MD Taking Active           Patient Active Problem List   Diagnosis Date Noted  . Poor dentition requiring referral to dentistry 03/26/2020  . Thiamine deficiency 12/30/2018  . Vitamin B12 deficiency anemia due to intrinsic factor deficiency 12/20/2018  . CHF (congestive heart failure), NYHA class I, chronic, combined ([place]) 12/19/2018  .  Coronary artery disease involving native coronary artery of native heart with angina pectoris ([place]) 12/19/2018  . Obesity with body mass index (BMI) of 30.0 to 39.9 01/15/2018  . Chronic idiopathic constipation 12/27/2017  . Atrial fibrillation ([place]) 10/30/2017  . Type 2 diabetes mellitus ([place]) 10/30/2017  . Eczema 08/07/2017  . Insomnia secondary to chronic pain 08/04/2016  . Visit for screening mammogram 04/14/2016  . LV dysfunction   . Routine general medical examination at a health care facility 05/03/2013  . DM (diabetes mellitus), type 2 with peripheral vascular complications ([place]) 30/16/0109  . PVC's/nonsustained VT 10/24/2012  . H/O: CVA (cerebrovascular accident) 02/09/2012  . Hypothyroidism 05/27/2011  . Fibromyalgia 10/08/2010  . Long term current use of anticoagulant 05/05/2010  . Low back pain, non-specific 05/12/2008  . Cognitive dysfunction associated with depression 01/29/2007  . Osteoarthritis 11/22/2006  . Hyperlipidemia associated with type 2 diabetes mellitus ([place]) 09/13/2006  . Coronary atherosclerosis 09/13/2006  . Paroxysmal atrial fibrillation ([place]) 09/13/2006  . GERD 09/13/2006  . Hypertension associated with diabetes ([place]) 08/30/2006    Immunization History  Administered Date(s) Administered  . Fluad Quad(high Dose 65+) 11/16/2018, 12/10/2019  . Influenza Split 12/14/2010, 12/12/2011  . Influenza Whole 01/05/2006, 01/29/2007, 12/26/2007, 11/28/2008, 12/04/2009  . Influenza, High Dose Seasonal PF 11/12/2014, 01/26/2016, 11/25/2016, 12/27/2017  . Influenza,inj,Quad PF,6+ Mos 11/12/2012, 04/08/2014  . Moderna Sars-Covid-2 Vaccination 04/20/2019, 06/03/2019  . Pneumococcal Conjugate-13 08/09/2013  . Pneumococcal Polysaccharide-23 12/04/2009, 12/14/2010, 10/12/2016  . Td 03/07/2005  .  Tdap 04/14/2016    Conditions to be addressed/monitored:  Hypertension, Hyperlipidemia, Diabetes, Atrial Fibrillation, Heart Failure and Coronary Artery Disease  Care Plan  : [place]  Updates made by Charlton Haws, [place] since 07/06/2020 12:00 AM    Problem: DM, HTN, HLD, CAD, CHF   Priority: High    Long-Range Goal: Disease management   Start Date: 04/07/2020  Expected End Date: 07/05/2020  This Visit's Progress: On track  Recent Progress: On track  Priority: High  Note:   Current Barriers:  . Unable to independently afford treatment regimen . Unable to independently monitor therapeutic efficacy . Unable to achieve control of Diabetes   Pharmacist Clinical Goal(s):  Marland Kitchen Patient will verbalize ability to afford treatment regimen . achieve adherence to monitoring guidelines and medication adherence to achieve therapeutic efficacy . achieve control of diabetes as evidenced by improvement in A1c through collaboration with PharmD and provider.   Interventions: . 1:1 collaboration with [name], MD regarding development and update of comprehensive plan of care as evidenced by provider attestation and co-signature . Inter-disciplinary care team collaboration (see longitudinal plan of care) . Comprehensive medication review performed; medication list updated in electronic medical record  Hypertension (BP goal <130/80) -Controlled - pt reports taking 1 dose of spironolactone per day instead of prescribed BID dosing due to frequent urination with BID dosing; her BP is well controlled this way and she denies excess swelling -Current treatment: o Metoprolol succinate 100 mg twice daily  o telmisartan 40 mg daily o spironolactone 25 mg daily o furosemide 20 mg daily o potassium chloride 20 meq twice daily o Nitroglycerin 0.4 mg as needed -Current home readings: 117/76 -Denies hypotensive/hypertensive symptoms -Counseled to monitor BP at home twice a week, document, and provide log at future appointments Plan: continue same medications; may continue spironolactone once daily  Hyperlipidemia: (LDL goal < 70) -Controlled - LDL is at goal;  pt endorses compliance and denies side effects -Current treatment: . Atorvastatin 40 mg daily -Educated on Cholesterol goals;  Benefits of statin for ASCVD risk reduction; Importance of limiting foods high in cholesterol; -Recommended to continue current medication  Diabetes (A1c goal <8%) -Uncontrolled - Ozempic was recently increased from 1 to 2 mg, however the 2 mg pen is not yet available and pt is not willing to inject 1 mg 2x for 1 dose. -Current medications: o Jardiance 25 mg daily (via [facility]) o metformin ER 1000 mg twice daily o Toujeo Max 50 units daily o Ozempic 1 mg weekly (via [facility]) -Current home glucose readings . fasting glucose: 140-160 -Denies hypoglycemic/hyperglycemic symptoms -Educated onA1c and blood sugar goals; Prevention and management of hypoglycemic episodes; Plan: Increase Ozempic to 2 mg weekly (rx sent to [facility])  Atrial Fibrillation (Goal: prevent stroke and major bleeding) -Controlled - pt reports occasional HR to 110s but reverts back to baseline 90s quickly -CHADSVASC: 8 -Current treatment: . Rate control: Metoprolol succinate 100 mg twice a day . Anticoagulation: Warfarin 3 mg as directed -Counseled on increased risk of stroke due to Afib and benefits of anticoagulation for stroke prevention; importance of adherence to anticoagulant exactly as prescribed; -Recommended to continue current medication  Heart Failure (Goal: control symptoms and prevent exacerbations) -Controlled - pt reports compliance with medications and denies excess swelling -Type: Combined Systolic and Diastolic -NYHA Class: II (slight limitation of activity) -Ejection fraction: 50-55% (Date: 09/02/2014) -Current treatment: see hypertension treatment (above) -Educated on Benefits of medications for managing symptoms and prolonging  life [place] diuretic administration and potassium supplementation Importance of blood pressure control -Recommended to continue current  medication  Patient Goals/Self-Care Activities . Patient will:  - take medications as prescribed focus on medication adherence by pill box check glucose daily, document, and provide at future appointments check blood pressure daily, document, and provide at future appointments  Follow Up Plan: Telephone follow up appointment with care management team member scheduled for: 3 months     Medication Assistance:  [facility] ([place]) [place] Clinical cytogeneticist)  Patient's preferred pharmacy is:  [facility] - [place], [place] - [address] [place] [address] Phone: [phone] Fax: [phone]  Uses pill box? Yes Pt endorses 100% compliance  We discussed: Current pharmacy is preferred with insurance plan and patient is satisfied with pharmacy services Patient decided to: Utilize [facility] pharmacy for medication synchronization, packaging and delivery  Care Plan and Follow Up Patient Decision:  Patient agrees to Care Plan and Follow-up.  Plan: Telephone follow up appointment with care management team member scheduled for:  3 months  Charlene Brooke, PharmD, [place], CPP Clinical Pharmacist [facility] [phone]

## 2020-07-08 ENCOUNTER — Telehealth: Payer: Self-pay | Admitting: Pharmacist

## 2020-07-08 NOTE — Progress Notes (Signed)
    Chronic Care Management Pharmacy Assistant   Name: Kristen [name]  MRN: [id number] DOB: [birth year]   Made a call to the patient this morning who left a message yesterday saying that she was having some diarrhea after she had taken Ozempic. Patient states that this was not the first time it happened another time as well. She states that she read that one of the side effects to Ozempic was diarrhea, and she was calling to inquire if there was something she could do about it. The patient stated that today she is not having any diarrhea and that it could have been caused from something else, but if it happens again she will call Dr. Ronnald Ramp.   [name] Clinical Pharmacist Assistant [phone]  Time spent:13

## 2020-07-20 ENCOUNTER — Telehealth: Payer: Self-pay | Admitting: Pharmacist

## 2020-07-20 DIAGNOSIS — I2510 Atherosclerotic heart disease of native coronary artery without angina pectoris: Secondary | ICD-10-CM

## 2020-07-20 DIAGNOSIS — E1151 Type 2 diabetes mellitus with diabetic peripheral angiopathy without gangrene: Secondary | ICD-10-CM

## 2020-07-20 NOTE — Progress Notes (Signed)
    Chronic Care Management Pharmacy Assistant   Name: Darrah Dredge  MRN: [id number] DOB: Jun 03, 1945  Patient called this afternoon stating that she will be out of her Jardiance by June and it states on her prescription bottle that she has no more refills. Patient states that she gets refills from [facility]. I called [facility] and spoke with Estill Bamberg a representative who stated that the prescribing Doctor needs to fax [phone]) a new prescription to [facility] where it will be processed and sent to the pharmacy.   [place] Pharmacist Assistant [phone]  Time spent:33

## 2020-07-28 ENCOUNTER — Telehealth (HOSPITAL_COMMUNITY): Payer: Medicare Other | Admitting: Psychiatry

## 2020-07-28 DIAGNOSIS — M65312 Trigger thumb, left thumb: Secondary | ICD-10-CM | POA: Diagnosis not present

## 2020-07-28 DIAGNOSIS — Z79891 Long term (current) use of opiate analgesic: Secondary | ICD-10-CM | POA: Diagnosis not present

## 2020-07-28 DIAGNOSIS — M150 Primary generalized (osteo)arthritis: Secondary | ICD-10-CM | POA: Diagnosis not present

## 2020-07-28 DIAGNOSIS — G894 Chronic pain syndrome: Secondary | ICD-10-CM | POA: Diagnosis not present

## 2020-07-29 ENCOUNTER — Telehealth: Payer: Self-pay | Admitting: Pharmacist

## 2020-07-29 NOTE — Progress Notes (Signed)
Chronic Care Management Pharmacy Assistant   Name: Kristen [name]  MRN: [id number] DOB: October 01, 1945   Reason for Encounter: Medication Review    Recent office visits:  None ID  Recent consult visits:  None ID  Hospital visits:  None in previous 6 months  Medications: Outpatient Encounter Medications as of 07/29/2020  Medication Sig  . ALPRAZolam (XANAX) 1 MG tablet Take 1 tablet (1 mg total) by mouth 3 (three) times daily as needed for anxiety.  . Ascorbic Acid (VITAMIN C) 1000 MG tablet Take 1,000 mg by mouth daily.  Marland Kitchen atorvastatin (LIPITOR) 40 MG tablet TAKE ONE TABLET BY MOUTH ONCE DAILY  . Blood Glucose Monitoring Suppl (ONE TOUCH ULTRA 2) w/Device KIT Use as advised  . Cholecalciferol (VITAMIN D3) 50 MCG (2000 UT) capsule Take 2,000 Units by mouth daily.  . cyclobenzaprine (FLEXERIL) 10 MG tablet Take 10 mg by mouth 3 (three) times daily as needed for muscle spasms.  . empagliflozin (JARDIANCE) 25 MG TABS tablet Take 1 tablet (25 mg total) by mouth daily.  . furosemide (LASIX) 20 MG tablet Take 1 tablet (20 mg total) by mouth every morning.  . gabapentin (NEURONTIN) 300 MG capsule TAKE ONE CAPSULE BY MOUTH THREE TIMES DAILY  . glucose blood (COOL BLOOD GLUCOSE TEST STRIPS) test strip Use to test blood sugar twice daily. DX: E11.9  . insulin glargine, 2 Unit Dial, (TOUJEO MAX SOLOSTAR) 300 UNIT/ML Solostar Pen Inject 50 Units into the skin daily.  . Insulin Pen Needle (NOVOFINE) 32G X 6 MM MISC 1 Act by Does not apply route daily.  . Lancets (ONETOUCH ULTRASOFT) lancets Use as instructed  . magnesium oxide (MAG-OX) 400 MG tablet Take 1 tablet (400 mg total) by mouth 2 (two) times daily. WITH BREAKFAST AND EVENING MEALS.  . metFORMIN (GLUCOPHAGE-XR) 500 MG 24 hr tablet TAKE TWO TABLETS BY MOUTH TWICE DAILY WITH FOOD  . metoprolol succinate (TOPROL-XL) 100 MG 24 hr tablet Take 1 tablet (100 mg total) by mouth 2 (two) times daily. Take with or immediately following a  meal.  . Multiple Vitamin (MULTIVITAMIN) tablet Take 1 tablet by mouth daily.  . nitroGLYCERIN (NITROSTAT) 0.4 MG SL tablet Place 1 tablet (0.4 mg total) under the tongue every 5 (five) minutes as needed for chest pain (x 3 doses). Reported on 05/08/2015  . ondansetron (ZOFRAN-ODT) 4 MG disintegrating tablet Take 1 tablet (4 mg total) by mouth every 8 (eight) hours as needed for nausea or vomiting.  Marland Kitchen PERCOCET 10-325 MG tablet Take 1 tablet by mouth 5 (five) times daily as needed.  . potassium chloride SA (KLOR-CON) 20 MEQ tablet Take 1 tablet (20 mEq total) by mouth 2 (two) times daily.  . Semaglutide, 1 MG/DOSE, (OZEMPIC, 1 MG/DOSE,) 2 MG/1.5ML SOPN Inject 2 mg into the skin once a week.  . spironolactone (ALDACTONE) 25 MG tablet Take 1 tablet (25 mg total) by mouth daily.  Marland Kitchen telmisartan (MICARDIS) 40 MG tablet TAKE ONE TABLET BY MOUTH ONCE DAILY  . thiamine 100 MG tablet Take 1 tablet (100 mg total) by mouth daily.  Marland Kitchen triamcinolone cream (KENALOG) 0.5 % Apply 1 application topically 3 (three) times daily.  Marland Kitchen warfarin (COUMADIN) 3 MG tablet TAKE ONE TABLET BY MOUTH AS DIRECTED by clinic   No facility-administered encounter medications on file as of 07/29/2020.     Received call from patient regarding medication management via [facility].  Patient requested an acute fill for Telmisartan to be delivered: 07/30/2020 Pharmacy needs refills?  No  Confirmed delivery date of 07/30/2020, advised patient that pharmacy will contact them the morning of delivery.  [place] Pharmacist Assistant [phone]  Time spent:20

## 2020-07-31 ENCOUNTER — Ambulatory Visit: Payer: Medicare Other | Admitting: *Deleted

## 2020-07-31 DIAGNOSIS — E785 Hyperlipidemia, unspecified: Secondary | ICD-10-CM

## 2020-07-31 DIAGNOSIS — I25119 Atherosclerotic heart disease of native coronary artery with unspecified angina pectoris: Secondary | ICD-10-CM | POA: Diagnosis not present

## 2020-07-31 DIAGNOSIS — E1159 Type 2 diabetes mellitus with other circulatory complications: Secondary | ICD-10-CM

## 2020-07-31 DIAGNOSIS — E1151 Type 2 diabetes mellitus with diabetic peripheral angiopathy without gangrene: Secondary | ICD-10-CM

## 2020-07-31 DIAGNOSIS — E1169 Type 2 diabetes mellitus with other specified complication: Secondary | ICD-10-CM

## 2020-07-31 DIAGNOSIS — I480 Paroxysmal atrial fibrillation: Secondary | ICD-10-CM

## 2020-07-31 DIAGNOSIS — F32A Depression, unspecified: Secondary | ICD-10-CM | POA: Diagnosis not present

## 2020-07-31 DIAGNOSIS — I152 Hypertension secondary to endocrine disorders: Secondary | ICD-10-CM

## 2020-07-31 DIAGNOSIS — I5042 Chronic combined systolic (congestive) and diastolic (congestive) heart failure: Secondary | ICD-10-CM

## 2020-07-31 DIAGNOSIS — G4701 Insomnia due to medical condition: Secondary | ICD-10-CM

## 2020-07-31 DIAGNOSIS — G8929 Other chronic pain: Secondary | ICD-10-CM

## 2020-08-04 ENCOUNTER — Other Ambulatory Visit: Payer: Self-pay

## 2020-08-04 ENCOUNTER — Ambulatory Visit (INDEPENDENT_AMBULATORY_CARE_PROVIDER_SITE_OTHER): Payer: Medicare Other | Admitting: General Practice

## 2020-08-04 DIAGNOSIS — Z7901 Long term (current) use of anticoagulants: Secondary | ICD-10-CM

## 2020-08-04 DIAGNOSIS — I480 Paroxysmal atrial fibrillation: Secondary | ICD-10-CM

## 2020-08-04 DIAGNOSIS — E538 Deficiency of other specified B group vitamins: Secondary | ICD-10-CM

## 2020-08-04 LAB — POCT INR: INR: 4.1 — AB (ref 2.0–3.0)

## 2020-08-04 MED ORDER — CYANOCOBALAMIN 1000 MCG/ML IJ SOLN
1000.0000 ug | Freq: Once | INTRAMUSCULAR | Status: AC
  Administered 2020-08-04: 1000 ug via INTRAMUSCULAR

## 2020-08-04 MED ORDER — EMPAGLIFLOZIN 25 MG PO TABS: 25.0000 mg | ORAL_TABLET | Freq: Every day | ORAL | 1 refills | Status: DC

## 2020-08-04 NOTE — Addendum Note (Signed)
Addended by: Charlton Haws on: 08/04/2020 02:52 PM   Modules accepted: Orders

## 2020-08-04 NOTE — Progress Notes (Signed)
I have reviewed and agree.

## 2020-08-04 NOTE — Patient Instructions (Addendum)
Pre visit review using our clinic review tool, if applicable. No additional management support is needed unless otherwise documented below in the visit note.  Skip dosage today and tomorrow (5/31 and 6/1) and then continue to take 1 tablet daily except 2 tablets on Mon and 1 1/2 on Fridays. Re-check in 3 weeks.

## 2020-08-05 ENCOUNTER — Telehealth: Payer: Self-pay

## 2020-08-05 ENCOUNTER — Telehealth: Payer: Self-pay | Admitting: Internal Medicine

## 2020-08-05 ENCOUNTER — Telehealth: Payer: Self-pay | Admitting: Pharmacist

## 2020-08-05 NOTE — Progress Notes (Addendum)
Chronic Care Management Pharmacy Assistant   Name: Kyani Simkin  MRN: [id number] DOB: 11-May-1945   Medications: Outpatient Encounter Medications as of 08/05/2020  Medication Sig   ALPRAZolam (XANAX) 1 MG tablet Take 1 tablet (1 mg total) by mouth 3 (three) times daily as needed for anxiety.   Ascorbic Acid (VITAMIN C) 1000 MG tablet Take 1,000 mg by mouth daily.   atorvastatin (LIPITOR) 40 MG tablet TAKE ONE TABLET BY MOUTH ONCE DAILY   Blood Glucose Monitoring Suppl (ONE TOUCH ULTRA 2) w/Device KIT Use as advised   Cholecalciferol (VITAMIN D3) 50 MCG (2000 UT) capsule Take 2,000 Units by mouth daily.   cyclobenzaprine (FLEXERIL) 10 MG tablet Take 10 mg by mouth 3 (three) times daily as needed for muscle spasms.   empagliflozin (JARDIANCE) 25 MG TABS tablet Take 1 tablet (25 mg total) by mouth daily. Via [facility] CARES pt assistance   furosemide (LASIX) 20 MG tablet Take 1 tablet (20 mg total) by mouth every morning.   gabapentin (NEURONTIN) 300 MG capsule TAKE ONE CAPSULE BY MOUTH THREE TIMES DAILY   glucose blood (COOL BLOOD GLUCOSE TEST STRIPS) test strip Use to test blood sugar twice daily. DX: E11.9   insulin glargine, 2 Unit Dial, (TOUJEO MAX SOLOSTAR) 300 UNIT/ML Solostar Pen Inject 50 Units into the skin daily.   Insulin Pen Needle (NOVOFINE) 32G X 6 MM MISC 1 Act by Does not apply route daily.   Lancets (ONETOUCH ULTRASOFT) lancets Use as instructed   magnesium oxide (MAG-OX) 400 MG tablet Take 1 tablet (400 mg total) by mouth 2 (two) times daily. WITH BREAKFAST AND EVENING MEALS.   metFORMIN (GLUCOPHAGE-XR) 500 MG 24 hr tablet TAKE TWO TABLETS BY MOUTH TWICE DAILY WITH FOOD   metoprolol succinate (TOPROL-XL) 100 MG 24 hr tablet Take 1 tablet (100 mg total) by mouth 2 (two) times daily. Take with or immediately following a meal.   Multiple Vitamin (MULTIVITAMIN) tablet Take 1 tablet by mouth daily.   nitroGLYCERIN (NITROSTAT) 0.4 MG SL tablet Place 1 tablet (0.4 mg total)  under the tongue every 5 (five) minutes as needed for chest pain (x 3 doses). Reported on 05/08/2015   ondansetron (ZOFRAN-ODT) 4 MG disintegrating tablet Take 1 tablet (4 mg total) by mouth every 8 (eight) hours as needed for nausea or vomiting.   PERCOCET 10-325 MG tablet Take 1 tablet by mouth 5 (five) times daily as needed.   potassium chloride SA (KLOR-CON) 20 MEQ tablet Take 1 tablet (20 mEq total) by mouth 2 (two) times daily.   Semaglutide, 1 MG/DOSE, (OZEMPIC, 1 MG/DOSE,) 2 MG/1.5ML SOPN Inject 2 mg into the skin once a week.   spironolactone (ALDACTONE) 25 MG tablet Take 1 tablet (25 mg total) by mouth daily.   telmisartan (MICARDIS) 40 MG tablet TAKE ONE TABLET BY MOUTH ONCE DAILY   thiamine 100 MG tablet Take 1 tablet (100 mg total) by mouth daily.   triamcinolone cream (KENALOG) 0.5 % Apply 1 application topically 3 (three) times daily.   warfarin (COUMADIN) 3 MG tablet TAKE ONE TABLET BY MOUTH AS DIRECTED by clinic   No facility-administered encounter medications on file as of 08/05/2020.    Pharmacist Review  A call was made to [name] per clinical pharmacist Mardella Layman for patient to call and make appointment with Dr. Yetta Barre. Patient states that she will call Dr. Yetta Barre office today and make that appointment. Patient also wanted to know if her [place] Pepper had been sent in to the  manufacture. She states  She will be running out soon and if she runs out before she get a delivery she would like to have samples if available.  [place] Pharmacist Assistant [phone]

## 2020-08-05 NOTE — Telephone Encounter (Signed)
Patient has been notified that Ozempic is ready for pick up.

## 2020-08-05 NOTE — Telephone Encounter (Signed)
Patient said that [facility] told her that she is needing a new prescription for empagliflozin (JARDIANCE) 25 MG TABS tablet. She said that [facility] would be faxing something over. Please advise

## 2020-08-05 NOTE — Telephone Encounter (Signed)
Scheduled the patient for 6 month visit with Dr. Burt Knack in October 2022. She was grateful for call and agrees with plan.

## 2020-08-06 ENCOUNTER — Other Ambulatory Visit: Payer: Self-pay | Admitting: Internal Medicine

## 2020-08-06 NOTE — Patient Instructions (Signed)
Visit Information  PATIENT GOALS: Goals Addressed            This Visit's Progress   . Work on managing my worry and fear       Timeframe:  Long-Range Goal Priority:  High Start Date:    04/09/2020                         Expected End Date:  09/03/2020              Follow Up Date 08/31/2020   - Continue with visits with  Dr Casimiro Needle and further discuss with him about resuming therapy if you feel needed. - practice relaxation or meditation  -continue with positive self talk, "don't sweat the small stuff", and allowing yourself to be more aware of your emotions and actions that come - reach out to CSW and/or Dr Casimiro Needle if needs arise and want to pursue counseling appointment before scheduled visit with Dr Casimiro Needle      Why is this important?    When you are stressed, down or upset, your body reacts too.   For example, your blood pressure may get higher; you may have a headache or stomachache.   When your emotions get the best of you, your body's ability to fight off cold and flu gets weak.   These steps will help you manage your emotions.     Notes:        The patient verbalized understanding of instructions, educational materials, and care plan provided today and declined offer to receive copy of patient instructions, educational materials, and care plan.   Telephone follow up appointment with care management team member scheduled for:08/31/2020  Eduard Clos MSW, LCSW Licensed Clinical Social Worker [place] [phone]

## 2020-08-06 NOTE — Chronic Care Management (AMB) (Signed)
Chronic Care Management    Clinical Social Work Note  08/06/2020 Name: Kristen [name] MRN: [id number] DOB: 20-Mar-1945  Kristen [name] is a 75 y.o. year old female who is a primary care patient of Kristen Lima, MD. The CCM team was consulted to assist the patient with chronic disease management and/or care coordination needs related to: [employer]  and [place] and [employer].   Engaged with patient by telephone for follow up visit in response to provider referral for social work chronic care management and care coordination services.   Consent to Services:  The patient was given information about Chronic Care Management services, agreed to services, and gave verbal consent prior to initiation of services.  Please see initial visit note for detailed documentation.   Patient agreed to services and consent obtained.   Assessment: Review of patient past medical history, allergies, medications, and health status, including review of relevant consultants reports was performed today as part of a comprehensive evaluation and provision of chronic care management and care coordination services.     SDOH (Social Determinants of Health) assessments and interventions performed:    Advanced Directives Status: Not addressed in this encounter.  CCM Care Plan  Allergies  Allergen Reactions  . Metformin And Related Other (See Comments)    Must take XR form only, cannot tolerate Regular release metformin  . Cleocin [Clindamycin Hcl] Diarrhea  . Codeine Itching  . Doxycycline Hyclate Diarrhea  . Macrolides And Ketolides Diarrhea  . Morphine Hives  . Pentazocine Lactate Itching and Nausea And Vomiting    (GENERIC- [place])  . Vibramycin [Doxycycline Calcium] Diarrhea and Rash  . Clindamycin Diarrhea  . Definity [Perflutren Lipid Microsphere] Other (See Comments)    Patient complained of warm sensation in chest x few seconds duration when injected Definity.No  other symptoms  . Doxycycline Diarrhea  . Metformin Other (See Comments)  . Pentazocine Nausea Only  . Perflutren Other (See Comments)  . Perflutren Lipid Microspheres Other (See Comments)  . Sulfa Antibiotics Diarrhea and Rash  . Sulfonamide Derivatives Rash    Outpatient Encounter Medications as of 07/31/2020  Medication Sig  . ALPRAZolam (XANAX) 1 MG tablet Take 1 tablet (1 mg total) by mouth 3 (three) times daily as needed for anxiety.  . Ascorbic Acid (VITAMIN C) 1000 MG tablet Take 1,000 mg by mouth daily.  Marland Kitchen atorvastatin (LIPITOR) 40 MG tablet TAKE ONE TABLET BY MOUTH ONCE DAILY  . Blood Glucose Monitoring Suppl (ONE TOUCH ULTRA 2) w/Device KIT Use as advised  . Cholecalciferol (VITAMIN D3) 50 MCG (2000 UT) capsule Take 2,000 Units by mouth daily.  . cyclobenzaprine (FLEXERIL) 10 MG tablet Take 10 mg by mouth 3 (three) times daily as needed for muscle spasms.  . furosemide (LASIX) 20 MG tablet Take 1 tablet (20 mg total) by mouth every morning.  . gabapentin (NEURONTIN) 300 MG capsule TAKE ONE CAPSULE BY MOUTH THREE TIMES DAILY  . glucose blood (COOL BLOOD GLUCOSE TEST STRIPS) test strip Use to test blood sugar twice daily. DX: E11.9  . insulin glargine, 2 Unit Dial, (TOUJEO MAX SOLOSTAR) 300 UNIT/ML Solostar Pen Inject 50 Units into the skin daily.  . Insulin Pen Needle (NOVOFINE) 32G X 6 MM MISC 1 Act by Does not apply route daily.  . Lancets (ONETOUCH ULTRASOFT) lancets Use as instructed  . magnesium oxide (MAG-OX) 400 MG tablet Take 1 tablet (400 mg total) by mouth 2 (two) times daily. WITH BREAKFAST AND EVENING MEALS.  Marland Kitchen  metFORMIN (GLUCOPHAGE-XR) 500 MG 24 hr tablet TAKE TWO TABLETS BY MOUTH TWICE DAILY WITH FOOD  . metoprolol succinate (TOPROL-XL) 100 MG 24 hr tablet Take 1 tablet (100 mg total) by mouth 2 (two) times daily. Take with or immediately following a meal.  . Multiple Vitamin (MULTIVITAMIN) tablet Take 1 tablet by mouth daily.  . nitroGLYCERIN (NITROSTAT) 0.4 MG SL  tablet Place 1 tablet (0.4 mg total) under the tongue every 5 (five) minutes as needed for chest pain (x 3 doses). Reported on 05/08/2015  . ondansetron (ZOFRAN-ODT) 4 MG disintegrating tablet Take 1 tablet (4 mg total) by mouth every 8 (eight) hours as needed for nausea or vomiting.  . PERCOCET 10-325 MG tablet Take 1 tablet by mouth 5 (five) times daily as needed.  . potassium chloride SA (KLOR-CON) 20 MEQ tablet Take 1 tablet (20 mEq total) by mouth 2 (two) times daily.  . Semaglutide, 1 MG/DOSE, (OZEMPIC, 1 MG/DOSE,) 2 MG/1.5ML SOPN Inject 2 mg into the skin once a week.  . spironolactone (ALDACTONE) 25 MG tablet Take 1 tablet (25 mg total) by mouth daily.  . telmisartan (MICARDIS) 40 MG tablet TAKE ONE TABLET BY MOUTH ONCE DAILY  . thiamine 100 MG tablet Take 1 tablet (100 mg total) by mouth daily.  . triamcinolone cream (KENALOG) 0.5 % Apply 1 application topically 3 (three) times daily.  . warfarin (COUMADIN) 3 MG tablet TAKE ONE TABLET BY MOUTH AS DIRECTED by clinic  . [DISCONTINUED] empagliflozin (JARDIANCE) 25 MG TABS tablet Take 1 tablet (25 mg total) by mouth daily.   No facility-administered encounter medications on file as of 07/31/2020.    Patient Active Problem List   Diagnosis Date Noted  . Poor dentition requiring referral to dentistry 03/26/2020  . Thiamine deficiency 12/30/2018  . Vitamin B12 deficiency anemia due to intrinsic factor deficiency 12/20/2018  . CHF (congestive heart failure), NYHA class I, chronic, combined (HCC) 12/19/2018  . Coronary artery disease involving native coronary artery of native heart with angina pectoris (HCC) 12/19/2018  . Obesity with body mass index (BMI) of 30.0 to 39.9 01/15/2018  . Chronic idiopathic constipation 12/27/2017  . Atrial fibrillation (HCC) 10/30/2017  . Type 2 diabetes mellitus (HCC) 10/30/2017  . Eczema 08/07/2017  . Insomnia secondary to chronic pain 08/04/2016  . Visit for screening mammogram 04/14/2016  . LV dysfunction    . Routine general medical examination at a health care facility 05/03/2013  . DM (diabetes mellitus), type 2 with peripheral vascular complications (HCC) 12/01/2012  . PVC's/nonsustained VT 10/24/2012  . H/O: CVA (cerebrovascular accident) 02/09/2012  . Hypothyroidism 05/27/2011  . Fibromyalgia 10/08/2010  . Long term current use of anticoagulant 05/05/2010  . Low back pain, non-specific 05/12/2008  . Cognitive dysfunction associated with depression 01/29/2007  . Osteoarthritis 11/22/2006  . Hyperlipidemia associated with type 2 diabetes mellitus (HCC) 09/13/2006  . Coronary atherosclerosis 09/13/2006  . Paroxysmal atrial fibrillation (HCC) 09/13/2006  . GERD 09/13/2006  . Hypertension associated with diabetes (HCC) 08/30/2006    Conditions to be addressed/monitored: Anxiety and Depression; Mental Health Concerns  and Family and relationship dysfunction  Care Plan : LCSW Plan of Care  Updates made by ,  P, LCSW since 08/06/2020 12:00 AM    Problem: Lacks strategy for management of anxiety around sleeping and fear of dying.   Priority: High    Long-Range Goal: Establish and Maintain plan for anxiety management   Start Date: 04/09/2020  Expected End Date: 09/03/2020  This Visit's Progress: On track    Recent Progress: On track  Priority: High  Note:   Current Barriers:  . Pt does not feel she needs therapy/counseling; stating, "I have been going through a lot of this for 52 years" . Pt acknowledges grief from the loss of her grandson- appears to be coping in a normal/healthy manner . Chronic Mental Health needs related to anxiety/PTSD - has participated in an initial visit with [facility] clinician and was not set up for follow up. Pt has decided to pause and does not plan on re-scheduling and/or connecting with new mental health therapist at this time. . Limited social support, Mental Health Concerns , and Family and relationship dysfunction . Suicidal Ideation/Homicidal  Ideation: No . Pt shares she is a bit overwhelmed by the pile of mail she has to go through and concerns for disposing of the personal documents-she continues to work on preparing them for shredding/trash Clinical Social Work Goal(s):  Marland Kitchen Over the next 30 days pt will begin to go through the stack of mail and organize a pile of papers that need to be shredded for privacy/confidentiality and those that can just be recycled or trashed.  . Pt will continue to focus on positive self talk and thought patterns, not over-reacting, and seeking ways to better cope with uncomfortable feelings as they arise . Over the next 90 days, patient will work with SW monthly by telephone to reduce or manage symptoms related to anxiety Interventions: Pt shared with CSW that things are going well- she admits to occasional "disagreements" at home with her spouse and has learned to "disconnect", avoid and remove herself from his presence when this occurs- to reduce the likelihood of it happening or erupting to cause her acute stress/anxiety.  She does share she continues to have thoughts about death and "dead people"; likely related to her grief and losses.  She continues to feel she does not want or need counseling and knows how to seek this if she decides she does.    Pt requests CSW call to check in again in 4-6 weeks.  . Motivational Interviewing . Solution-Focused Strategies . Mindfulness or Relaxation Training . Brief CBT . Grief Counseling . Emotional/Supportive Counseling . Psychoeducation and/or Health Education . Problem Solving Patient Coping Strengths:  . Spirituality . Hopefulness . Self Advocate . Able to Communicate Effectively . Rapport and trust with her Psychiatrist and PCP Patient Self Care Deficits:  . Lacks social connections Patient Goals:   - Continue with visits with  Dr Casimiro Needle and further discuss with him about resuming therapy if you feel needed. - practice relaxation or meditation   -continue with positive self talk, "don't sweat the small stuff", and allowing yourself to be more aware of your emotions and actions that come - reach out to CSW and/or Dr Casimiro Needle if needs arise and want to pursue counseling appointment before scheduled visit with Dr Casimiro Needle  Follow Up Plan: Appointment scheduled for SW follow up with client by phone on:  08/31/2020 at 2pm.      Follow Up Plan: Appointment scheduled for SW follow up with client by phone on: 08/31/2020      Eduard Clos MSW, [place] Licensed Clinical Social Worker [place] [phone]

## 2020-08-10 ENCOUNTER — Encounter: Payer: Self-pay | Admitting: Internal Medicine

## 2020-08-10 NOTE — Telephone Encounter (Signed)
Jardiance rx was faxed to [facility] on 08/04/20

## 2020-08-10 NOTE — Progress Notes (Signed)
Kristen [name] called today wanting to see if Dr. Ronnald Ramp had any samples of Jardiance. Patient states that she only has 4 tabs left. Messaged [name] the clinical pharmacist who stated that [name] does not come in her dose. Patient asked if the prescription could be faxed back to [facility] who stated that they have not received a fax for a new prescription. Clinical pharmacist to refax prescription.  [place] Pharmacist Assistant [phone]  Time spent:19

## 2020-08-18 ENCOUNTER — Encounter: Payer: Self-pay | Admitting: Internal Medicine

## 2020-08-18 ENCOUNTER — Telehealth (INDEPENDENT_AMBULATORY_CARE_PROVIDER_SITE_OTHER): Payer: Medicare Other | Admitting: Psychiatry

## 2020-08-18 ENCOUNTER — Other Ambulatory Visit: Payer: Self-pay

## 2020-08-18 DIAGNOSIS — F4323 Adjustment disorder with mixed anxiety and depressed mood: Secondary | ICD-10-CM | POA: Diagnosis not present

## 2020-08-18 MED ORDER — ALPRAZOLAM 1 MG PO TABS: 1.0000 mg | ORAL_TABLET | Freq: Three times a day (TID) | ORAL | 5 refills | Status: DC | PRN

## 2020-08-18 NOTE — Progress Notes (Signed)
She is only fair. None Patient ID: Kristen [name], female   DOB: [birth year], 75 y.o.   MRN: [id number] [facility] MD Progress Note  08/18/2020 3:50 PM Kristen [name]  MRN:  [id number] Subjective:  Scared Principal Problem: Adjustment disorder with an anxious mood state  Today the patient is stable.  Her anxiety is reasonably well controlled.  She is given up to the idea that she is going to have to live with her husband over these years and that even though at times she can be irritable she is except that she has tolerated.  The patient is actually functioning fairly well.  She does not drive her husband does all the driving.  Generally the patient is sleeping and eating well.  She has had no real falls.  She is positive and optimistic.  She is very consistent about her medicines.  She drinks no alcohol and uses no drugs.  She has a chronic anxiety disorder that is well controlled with Xanax.  She is not oversedated.  She is functioning actually pretty well. Past Medical History:  Diagnosis Date   Arthritis    Blood transfusion without reported diagnosis    Bursitis    CAD (coronary artery disease)    a. s/p CABG 2004.b. stable cath 2014 demonstrating stable CAD and continued patency of her LIMA graft.   Chronic anticoagulation    on coumadin   DDD (degenerative disc disease), lumbar    Depression    Diabetes mellitus    Diastolic dysfunction    per echo in October 2012 with EF 50 to 55%   Fibromyalgia    GERD (gastroesophageal reflux disease) 10/23/2003   Headache(784.0)    Hyperlipidemia    Hypertension    Hypokalemia    LBBB (left bundle branch block)    Lumbar back pain    LV dysfunction    a. EF 45% by cath 2014. b. EF 50-55% by technically difficult echo in 08/2014.   Lymphadenitis    Morbid obesity (HCC)    a. Sleep study negative for significant OSA in 11/2014.   PAF (paroxysmal atrial fibrillation) (HCC)    Stroke (HCC) 2004   affected speech per pt    Past  Surgical History:  Procedure Laterality Date   ABDOMINAL HYSTERECTOMY     ANGIOPLASTY  laminectomy   CHOLECYSTECTOMY     CORONARY ARTERY BYPASS GRAFT     LIMA to LAD    KNEE ARTHROSCOPY     LEFT HEART CATHETERIZATION WITH CORONARY/GRAFT ANGIOGRAM  12/07/2012   Procedure: LEFT HEART CATHETERIZATION WITH Isabel Caprice;  Surgeon: Micheline Chapman, MD;  Location: [facility] CATH LAB;  Service: Cardiovascular;;   LUMBAR LAMINECTOMY     x3   SPHINCTEROTOMY     TONSILLECTOMY     Family History:  Family History  Problem Relation Age of Onset   Heart attack Father    Heart disease Father    Stroke Mother    Kidney disease Other    Stroke Other    Arthritis Other    Hypertension Other    Diabetes Other    Colon cancer Paternal Uncle    Depression Sister    Anxiety disorder Maternal Aunt    Family Psychiatric  History:  Social History:  Social History   Substance and Sexual Activity  Alcohol Use No     Social History   Substance and Sexual Activity  Drug Use No    Social History   Socioeconomic History  Marital status: Married    Spouse name: Not on file   Number of children: Not on file   Years of education: Not on file   Highest education level: Not on file  Occupational History   Not on file  Tobacco Use   Smoking status: Former    Pack years: 0.00    Types: Cigarettes    Quit date: 03/07/2001    Years since quitting: 19.4   Smokeless tobacco: Never  Vaping Use   Vaping Use: Never used  Substance and Sexual Activity   Alcohol use: No   Drug use: No   Sexual activity: Not Currently  Other Topics Concern   Not on file  Social History Narrative   Lives locally, has help available if needed.   Social Determinants of Health   Financial Resource Strain: High Risk   Difficulty of Paying Living Expenses: Hard  Food Insecurity: No Food Insecurity   Worried About Charity fundraiser in the Last Year: Never true   Ran Out of Food in the Last Year: Never true   Transportation Needs: No Transportation Needs   Lack of Transportation (Medical): No   Lack of Transportation (Non-Medical): No  Physical Activity: Inactive   Days of Exercise per Week: 0 days   Minutes of Exercise per Session: 0 min  Stress: Stress Concern Present   Feeling of Stress : To some extent  Social Connections: Moderately Isolated   Frequency of Communication with Friends and Family: More than three times a week   Frequency of Social Gatherings with Friends and Family: Never   Attends Religious Services: Never   Marine scientist or Organizations: No   Attends Music therapist: Never   Marital Status: Married   Additional Social History:                         Sleep: Fair  Appetite:  Fair  Current Medications: Current Outpatient Medications  Medication Sig Dispense Refill   ALPRAZolam (XANAX) 1 MG tablet Take 1 tablet (1 mg total) by mouth 3 (three) times daily as needed for anxiety. 90 tablet 5   Ascorbic Acid (VITAMIN C) 1000 MG tablet Take 1,000 mg by mouth daily.     atorvastatin (LIPITOR) 40 MG tablet TAKE ONE TABLET BY MOUTH ONCE DAILY 90 tablet 1   Blood Glucose Monitoring Suppl (ONE TOUCH ULTRA 2) w/Device KIT Use as advised 1 each 0   Cholecalciferol (VITAMIN D3) 50 MCG (2000 UT) capsule Take 2,000 Units by mouth daily.     cyclobenzaprine (FLEXERIL) 10 MG tablet Take 10 mg by mouth 3 (three) times daily as needed for muscle spasms.     empagliflozin (JARDIANCE) 25 MG TABS tablet Take 1 tablet (25 mg total) by mouth daily. Via BI CARES pt assistance 90 tablet 1   furosemide (LASIX) 20 MG tablet Take 1 tablet (20 mg total) by mouth every morning. 90 tablet 1   gabapentin (NEURONTIN) 300 MG capsule TAKE ONE CAPSULE BY MOUTH THREE TIMES DAILY 270 capsule 0   glucose blood (COOL BLOOD GLUCOSE TEST STRIPS) test strip Use to test blood sugar twice daily. DX: E11.9 100 each 12   insulin glargine, 2 Unit Dial, (TOUJEO MAX SOLOSTAR) 300  UNIT/ML Solostar Pen Inject 50 Units into the skin daily. 15 mL 5   Insulin Pen Needle (NOVOFINE) 32G X 6 MM MISC 1 Act by Does not apply route daily. 100 each 3  Lancets (ONETOUCH ULTRASOFT) lancets Use as instructed 100 each 12   magnesium oxide (MAG-OX) 400 MG tablet Take 1 tablet (400 mg total) by mouth 2 (two) times daily. WITH BREAKFAST AND EVENING MEALS. 180 tablet 3   metFORMIN (GLUCOPHAGE-XR) 500 MG 24 hr tablet TAKE TWO TABLETS BY MOUTH TWICE DAILY WITH FOOD 260 tablet 1   metoprolol succinate (TOPROL-XL) 100 MG 24 hr tablet Take 1 tablet (100 mg total) by mouth 2 (two) times daily. Take with or immediately following a meal. 180 tablet 2   Multiple Vitamin (MULTIVITAMIN) tablet Take 1 tablet by mouth daily.     nitroGLYCERIN (NITROSTAT) 0.4 MG SL tablet Place 1 tablet (0.4 mg total) under the tongue every 5 (five) minutes as needed for chest pain (x 3 doses). Reported on 05/08/2015 35 tablet 2   ondansetron (ZOFRAN-ODT) 4 MG disintegrating tablet Take 1 tablet (4 mg total) by mouth every 8 (eight) hours as needed for nausea or vomiting. 60 tablet 2   PERCOCET 10-325 MG tablet Take 1 tablet by mouth 5 (five) times daily as needed.     potassium chloride SA (KLOR-CON) 20 MEQ tablet Take 1 tablet (20 mEq total) by mouth 2 (two) times daily. 180 tablet 2   Semaglutide, 1 MG/DOSE, (OZEMPIC, 1 MG/DOSE,) 2 MG/1.5ML SOPN Inject 2 mg into the skin once a week. 18 mL 1   spironolactone (ALDACTONE) 25 MG tablet Take 1 tablet (25 mg total) by mouth daily. 90 tablet 0   telmisartan (MICARDIS) 40 MG tablet TAKE ONE TABLET BY MOUTH ONCE DAILY 90 tablet 1   thiamine 100 MG tablet Take 1 tablet (100 mg total) by mouth daily. 90 tablet 1   triamcinolone cream (KENALOG) 0.5 % Apply 1 application topically 3 (three) times daily. 60 g 3   warfarin (COUMADIN) 3 MG tablet TAKE ONE TABLET BY MOUTH AS DIRECTED by clinic 60 tablet 2   No current facility-administered medications for this visit.    Lab Results:   No results found. However, due to the size of the patient record, not all encounters were searched. Please check Results Review for a complete set of results.  Physical Findings: AIMS:  , ,  ,  ,    CIWA:    COWS:     Musculoskeletal: Strength & Muscle Tone: within normal limits Gait & Station: normal Patient leans: Right  Psychiatric Specialty Exam: ROS  There were no vitals taken for this visit.There is no height or weight on file to calculate BMI.  General Appearance: NA  Eye Contact::  Good  Speech:  Clear and Coherent  Volume:  Normal  Mood:  NA  Affect:  Congruent  Thought Process:  Coherent  Orientation:  Full (Time, Place, and Person)  Thought Content:  WDL  Suicidal Thoughts:  No  Homicidal Thoughts:  No  Memory:  NA  Judgement:  Good  Insight:  Good  Psychomotor Activity:  Normal  Concentration:  Good  Recall:  Good  Fund of Knowledge:Fair  Language: Good  Akathisia:  No  Handed:  Left  AIMS (if indicated):     Assets:  Desire for Improvement  ADL's:  Intact  Cognition: WNL   This patient is diagnosis of an adjustment disorder with an anxious mood state.  She is well controlled on a fixed dose of Xanax.  She uses exactly as prescribed.  She drinks no alcohol and uses no drugs.  She will return to see me in 5 months.

## 2020-08-19 ENCOUNTER — Other Ambulatory Visit: Payer: Self-pay | Admitting: Internal Medicine

## 2020-08-19 DIAGNOSIS — E1151 Type 2 diabetes mellitus with diabetic peripheral angiopathy without gangrene: Secondary | ICD-10-CM

## 2020-08-19 DIAGNOSIS — M797 Fibromyalgia: Secondary | ICD-10-CM

## 2020-08-19 MED ORDER — GABAPENTIN 300 MG PO CAPS: 1.0000 | ORAL_CAPSULE | Freq: Three times a day (TID) | ORAL | 0 refills | Status: DC

## 2020-08-20 ENCOUNTER — Telehealth: Payer: Self-pay | Admitting: Pharmacist

## 2020-08-20 NOTE — Chronic Care Management (AMB) (Signed)
Chronic Care Management Pharmacy Assistant   Name: Kristen [name]  MRN: [id number] DOB: 01-Oct-1945   Reason for Encounter: Medication Review   Recent office visits:  None ID  Recent consult visits:  08/18/20 Dr. Norma Fredrickson, video visit  Hospital visits:  None in previous 6 months  Medications: Outpatient Encounter Medications as of 08/20/2020  Medication Sig   ALPRAZolam (XANAX) 1 MG tablet Take 1 tablet (1 mg total) by mouth 3 (three) times daily as needed for anxiety.   Ascorbic Acid (VITAMIN C) 1000 MG tablet Take 1,000 mg by mouth daily.   atorvastatin (LIPITOR) 40 MG tablet TAKE ONE TABLET BY MOUTH ONCE DAILY   Blood Glucose Monitoring Suppl (ONE TOUCH ULTRA 2) w/Device KIT Use as advised   Cholecalciferol (VITAMIN D3) 50 MCG (2000 UT) capsule Take 2,000 Units by mouth daily.   cyclobenzaprine (FLEXERIL) 10 MG tablet Take 10 mg by mouth 3 (three) times daily as needed for muscle spasms.   empagliflozin (JARDIANCE) 25 MG TABS tablet Take 1 tablet (25 mg total) by mouth daily. Via BI CARES pt assistance   furosemide (LASIX) 20 MG tablet Take 1 tablet (20 mg total) by mouth every morning.   gabapentin (NEURONTIN) 300 MG capsule Take 1 capsule (300 mg total) by mouth 3 (three) times daily.   glucose blood (COOL BLOOD GLUCOSE TEST STRIPS) test strip Use to test blood sugar twice daily. DX: E11.9   insulin glargine, 2 Unit Dial, (TOUJEO MAX SOLOSTAR) 300 UNIT/ML Solostar Pen Inject 50 Units into the skin daily.   Insulin Pen Needle (NOVOFINE) 32G X 6 MM MISC 1 Act by Does not apply route daily.   Lancets (ONETOUCH ULTRASOFT) lancets Use as instructed   magnesium oxide (MAG-OX) 400 MG tablet Take 1 tablet (400 mg total) by mouth 2 (two) times daily. WITH BREAKFAST AND EVENING MEALS.   metFORMIN (GLUCOPHAGE-XR) 500 MG 24 hr tablet TAKE TWO TABLETS BY MOUTH TWICE DAILY WITH FOOD   metoprolol succinate (TOPROL-XL) 100 MG 24 hr tablet Take 1 tablet (100 mg total) by mouth 2  (two) times daily. Take with or immediately following a meal.   Multiple Vitamin (MULTIVITAMIN) tablet Take 1 tablet by mouth daily.   nitroGLYCERIN (NITROSTAT) 0.4 MG SL tablet Place 1 tablet (0.4 mg total) under the tongue every 5 (five) minutes as needed for chest pain (x 3 doses). Reported on 05/08/2015   ondansetron (ZOFRAN-ODT) 4 MG disintegrating tablet Take 1 tablet (4 mg total) by mouth every 8 (eight) hours as needed for nausea or vomiting.   PERCOCET 10-325 MG tablet Take 1 tablet by mouth 5 (five) times daily as needed.   potassium chloride SA (KLOR-CON) 20 MEQ tablet Take 1 tablet (20 mEq total) by mouth 2 (two) times daily.   Semaglutide, 1 MG/DOSE, (OZEMPIC, 1 MG/DOSE,) 2 MG/1.5ML SOPN Inject 2 mg into the skin once a week.   spironolactone (ALDACTONE) 25 MG tablet Take 1 tablet (25 mg total) by mouth daily.   telmisartan (MICARDIS) 40 MG tablet TAKE ONE TABLET BY MOUTH ONCE DAILY   thiamine 100 MG tablet Take 1 tablet (100 mg total) by mouth daily.   triamcinolone cream (KENALOG) 0.5 % Apply 1 application topically 3 (three) times daily.   warfarin (COUMADIN) 3 MG tablet TAKE ONE TABLET BY MOUTH AS DIRECTED by clinic   No facility-administered encounter medications on file as of 08/20/2020.   Pharmacist Review Received call from patient regarding medication management via [facility].  Patient requested  an acute fill for Gabapentin to be delivered: 08/21/20 Pharmacy needs refills? No, refill sent in yesterday from Dr. Ronnald Ramp  Confirmed delivery date of 08/21/20, advised patient that pharmacy will contact them the morning of delivery.  Star Rating Drugs: Atorvastatin 07/27/20 52 ds Telmisartan 07/30/20 90 ds  [place] Pharmacist Assistant [phone]   Time spent:15

## 2020-08-25 ENCOUNTER — Ambulatory Visit (INDEPENDENT_AMBULATORY_CARE_PROVIDER_SITE_OTHER): Payer: Medicare Other | Admitting: General Practice

## 2020-08-25 ENCOUNTER — Other Ambulatory Visit: Payer: Self-pay

## 2020-08-25 DIAGNOSIS — Z7901 Long term (current) use of anticoagulants: Secondary | ICD-10-CM | POA: Diagnosis not present

## 2020-08-25 DIAGNOSIS — I480 Paroxysmal atrial fibrillation: Secondary | ICD-10-CM

## 2020-08-25 DIAGNOSIS — E538 Deficiency of other specified B group vitamins: Secondary | ICD-10-CM

## 2020-08-25 LAB — POCT INR: INR: 3 (ref 2.0–3.0)

## 2020-08-25 MED ORDER — CYANOCOBALAMIN 1000 MCG/ML IJ SOLN
1000.0000 ug | Freq: Once | INTRAMUSCULAR | Status: AC
Start: 2020-08-25 — End: 2020-08-25
  Administered 2020-08-25: 1000 ug via INTRAMUSCULAR

## 2020-08-25 NOTE — Patient Instructions (Addendum)
Pre visit review using our clinic review tool, if applicable. No additional management support is needed unless otherwise documented below in the visit note.  Continue to take 1 tablet daily except 2 tablets on Mon and 1 1/2 on Fridays. Re-check in 4 weeks.

## 2020-08-25 NOTE — Progress Notes (Signed)
I have reviewed and agree.

## 2020-08-26 ENCOUNTER — Ambulatory Visit: Payer: Medicare Other | Admitting: Internal Medicine

## 2020-08-26 DIAGNOSIS — M65312 Trigger thumb, left thumb: Secondary | ICD-10-CM | POA: Diagnosis not present

## 2020-08-26 DIAGNOSIS — G894 Chronic pain syndrome: Secondary | ICD-10-CM | POA: Diagnosis not present

## 2020-08-26 DIAGNOSIS — Z79891 Long term (current) use of opiate analgesic: Secondary | ICD-10-CM | POA: Diagnosis not present

## 2020-08-26 DIAGNOSIS — M150 Primary generalized (osteo)arthritis: Secondary | ICD-10-CM | POA: Diagnosis not present

## 2020-08-31 ENCOUNTER — Telehealth: Payer: Medicare Other

## 2020-08-31 ENCOUNTER — Telehealth: Payer: Self-pay

## 2020-08-31 NOTE — Telephone Encounter (Signed)
Pt has been informed that patient assistance meds are ready for pick up. Will have husband come by to get them.  Located in upstairs unused lab fridge.

## 2020-09-03 ENCOUNTER — Ambulatory Visit (INDEPENDENT_AMBULATORY_CARE_PROVIDER_SITE_OTHER): Payer: Medicare Other | Admitting: *Deleted

## 2020-09-03 DIAGNOSIS — F32A Depression, unspecified: Secondary | ICD-10-CM

## 2020-09-03 DIAGNOSIS — E1151 Type 2 diabetes mellitus with diabetic peripheral angiopathy without gangrene: Secondary | ICD-10-CM

## 2020-09-03 NOTE — Progress Notes (Signed)
Chronic Care Management Pharmacy Assistant   Name: Kristen [name]  MRN: [id number] DOB: 1946/02/29   Reason for Encounter: Chart Review    Medications: Outpatient Encounter Medications as of 08/20/2020  Medication Sig   ALPRAZolam (XANAX) 1 MG tablet Take 1 tablet (1 mg total) by mouth 3 (three) times daily as needed for anxiety.   Ascorbic Acid (VITAMIN C) 1000 MG tablet Take 1,000 mg by mouth daily.   atorvastatin (LIPITOR) 40 MG tablet TAKE ONE TABLET BY MOUTH ONCE DAILY   Blood Glucose Monitoring Suppl (ONE TOUCH ULTRA 2) w/Device KIT Use as advised   Cholecalciferol (VITAMIN D3) 50 MCG (2000 UT) capsule Take 2,000 Units by mouth daily.   cyclobenzaprine (FLEXERIL) 10 MG tablet Take 10 mg by mouth 3 (three) times daily as needed for muscle spasms.   empagliflozin (JARDIANCE) 25 MG TABS tablet Take 1 tablet (25 mg total) by mouth daily. Via BI CARES pt assistance   furosemide (LASIX) 20 MG tablet Take 1 tablet (20 mg total) by mouth every morning.   gabapentin (NEURONTIN) 300 MG capsule Take 1 capsule (300 mg total) by mouth 3 (three) times daily.   glucose blood (COOL BLOOD GLUCOSE TEST STRIPS) test strip Use to test blood sugar twice daily. DX: E11.9   insulin glargine, 2 Unit Dial, (TOUJEO MAX SOLOSTAR) 300 UNIT/ML Solostar Pen Inject 50 Units into the skin daily.   Insulin Pen Needle (NOVOFINE) 32G X 6 MM MISC 1 Act by Does not apply route daily.   Lancets (ONETOUCH ULTRASOFT) lancets Use as instructed   magnesium oxide (MAG-OX) 400 MG tablet Take 1 tablet (400 mg total) by mouth 2 (two) times daily. WITH BREAKFAST AND EVENING MEALS.   metFORMIN (GLUCOPHAGE-XR) 500 MG 24 hr tablet TAKE TWO TABLETS BY MOUTH TWICE DAILY WITH FOOD   metoprolol succinate (TOPROL-XL) 100 MG 24 hr tablet Take 1 tablet (100 mg total) by mouth 2 (two) times daily. Take with or immediately following a meal.   Multiple Vitamin (MULTIVITAMIN) tablet Take 1 tablet by mouth daily.   nitroGLYCERIN  (NITROSTAT) 0.4 MG SL tablet Place 1 tablet (0.4 mg total) under the tongue every 5 (five) minutes as needed for chest pain (x 3 doses). Reported on 05/08/2015   ondansetron (ZOFRAN-ODT) 4 MG disintegrating tablet Take 1 tablet (4 mg total) by mouth every 8 (eight) hours as needed for nausea or vomiting.   PERCOCET 10-325 MG tablet Take 1 tablet by mouth 5 (five) times daily as needed.   potassium chloride SA (KLOR-CON) 20 MEQ tablet Take 1 tablet (20 mEq total) by mouth 2 (two) times daily.   Semaglutide, 1 MG/DOSE, (OZEMPIC, 1 MG/DOSE,) 2 MG/1.5ML SOPN Inject 2 mg into the skin once a week.   spironolactone (ALDACTONE) 25 MG tablet Take 1 tablet (25 mg total) by mouth daily.   telmisartan (MICARDIS) 40 MG tablet TAKE ONE TABLET BY MOUTH ONCE DAILY   thiamine 100 MG tablet Take 1 tablet (100 mg total) by mouth daily.   triamcinolone cream (KENALOG) 0.5 % Apply 1 application topically 3 (three) times daily.   warfarin (COUMADIN) 3 MG tablet TAKE ONE TABLET BY MOUTH AS DIRECTED by clinic   No facility-administered encounter medications on file as of 08/20/2020.   Pharmacist Review  Reviewed chart for medication changes and adherence.  No OVs, Consults, or hospital visits since last care coordination call / Pharmacist visit. No medication changes indicated  No gaps in adherence identified. Patient has follow up scheduled with  pharmacy team. No further action required.   [place] Pharmacist Assistant [phone]   Time spent:6

## 2020-09-04 NOTE — Patient Instructions (Signed)
Visit Information  PATIENT GOALS:  Goals Addressed             This Visit's Progress    Work on managing my worry and fear       Timeframe:  Long-Range Goal Priority:  High Start Date:    04/09/2020                         Expected End Date:  09/22/2020              Follow Up Date    - Continue with visits with  Dr Casimiro Needle and further discuss with him about resuming therapy if you feel needed. - practice relaxation or meditation  -continue with positive self talk, "don't sweat the small stuff", and allowing yourself to be more aware of your emotions and actions that come - reach out to CSW and/or Dr Casimiro Needle if needs arise and want to pursue counseling appointment before scheduled visit with Dr Casimiro Needle      Why is this important?   When you are stressed, down or upset, your body reacts too.  For example, your blood pressure may get higher; you may have a headache or stomachache.  When your emotions get the best of you, your body's ability to fight off cold and flu gets weak.  These steps will help you manage your emotions.     Notes:          The patient verbalized understanding of instructions, educational materials, and care plan provided today and declined offer to receive copy of patient instructions, educational materials, and care plan.   Telephone follow up appointment with care management team member scheduled for:09/22/20  Eduard Clos MSW, LCSW Licensed Clinical Social Worker [place] [phone]

## 2020-09-04 NOTE — Chronic Care Management (AMB) (Signed)
Chronic Care Management    Clinical Social Work Note  09/04/2020 Name: Kristen [name] MRN: [id number] DOB: 1945/03/14  Kristen [name] Kristen [name] is a 75 y.o. year old female who is a primary care patient of Kristen Lima, MD. The CCM team was consulted to assist the patient with chronic disease management and/or care coordination needs related to: Mental Health Counseling and Resources and Grief Counseling.   Engaged with patient by telephone for follow up visit in response to provider referral for social work chronic care management and care coordination services.   Consent to Services:  The patient was given information about Chronic Care Management services, agreed to services, and gave verbal consent prior to initiation of services.  Please see initial visit note for detailed documentation.   Patient agreed to services and consent obtained.   Assessment: Review of patient past medical history, allergies, medications, and health status, including review of relevant consultants reports was performed today as part of a comprehensive evaluation and provision of chronic care management and care coordination services.     SDOH (Social Determinants of Health) assessments and interventions performed:    Advanced Directives Status: Not addressed in this encounter.  CCM Care Plan  Allergies  Allergen Reactions   Metformin And Related Other (See Comments)    Must take XR form only, cannot tolerate Regular release metformin   Cleocin [Clindamycin Hcl] Diarrhea   Codeine Itching   Doxycycline Hyclate Diarrhea   Macrolides And Ketolides Diarrhea   Morphine Hives   Pentazocine Lactate Itching and Nausea And Vomiting    (GENERIC- [place])   Vibramycin [Doxycycline Calcium] Diarrhea and Rash   Clindamycin Diarrhea   Definity [Perflutren Lipid Microsphere] Other (See Comments)    Patient complained of warm sensation in chest x few seconds duration when injected Definity.No other symptoms    Doxycycline Diarrhea   Metformin Other (See Comments)   Pentazocine Nausea Only   Perflutren Other (See Comments)   Perflutren Lipid Microspheres Other (See Comments)   Sulfa Antibiotics Diarrhea and Rash   Sulfonamide Derivatives Rash    Outpatient Encounter Medications as of 09/03/2020  Medication Sig   ALPRAZolam (XANAX) 1 MG tablet Take 1 tablet (1 mg total) by mouth 3 (three) times daily as needed for anxiety.   Ascorbic Acid (VITAMIN C) 1000 MG tablet Take 1,000 mg by mouth daily.   atorvastatin (LIPITOR) 40 MG tablet TAKE ONE TABLET BY MOUTH ONCE DAILY   Blood Glucose Monitoring Suppl (ONE TOUCH ULTRA 2) w/Device KIT Use as advised   Cholecalciferol (VITAMIN D3) 50 MCG (2000 UT) capsule Take 2,000 Units by mouth daily.   cyclobenzaprine (FLEXERIL) 10 MG tablet Take 10 mg by mouth 3 (three) times daily as needed for muscle spasms.   empagliflozin (JARDIANCE) 25 MG TABS tablet Take 1 tablet (25 mg total) by mouth daily. Via BI CARES pt assistance   furosemide (LASIX) 20 MG tablet Take 1 tablet (20 mg total) by mouth every morning.   gabapentin (NEURONTIN) 300 MG capsule Take 1 capsule (300 mg total) by mouth 3 (three) times daily.   glucose blood (COOL BLOOD GLUCOSE TEST STRIPS) test strip Use to test blood sugar twice daily. DX: E11.9   insulin glargine, 2 Unit Dial, (TOUJEO MAX SOLOSTAR) 300 UNIT/ML Solostar Pen Inject 50 Units into the skin daily.   Insulin Pen Needle (NOVOFINE) 32G X 6 MM MISC 1 Act by Does not apply route daily.   Lancets (ONETOUCH ULTRASOFT) lancets Use as instructed  magnesium oxide (MAG-OX) 400 MG tablet Take 1 tablet (400 mg total) by mouth 2 (two) times daily. WITH BREAKFAST AND EVENING MEALS.   metFORMIN (GLUCOPHAGE-XR) 500 MG 24 hr tablet TAKE TWO TABLETS BY MOUTH TWICE DAILY WITH FOOD   metoprolol succinate (TOPROL-XL) 100 MG 24 hr tablet Take 1 tablet (100 mg total) by mouth 2 (two) times daily. Take with or immediately following a meal.   Multiple  Vitamin (MULTIVITAMIN) tablet Take 1 tablet by mouth daily.   nitroGLYCERIN (NITROSTAT) 0.4 MG SL tablet Place 1 tablet (0.4 mg total) under the tongue every 5 (five) minutes as needed for chest pain (x 3 doses). Reported on 05/08/2015   ondansetron (ZOFRAN-ODT) 4 MG disintegrating tablet Take 1 tablet (4 mg total) by mouth every 8 (eight) hours as needed for nausea or vomiting.   PERCOCET 10-325 MG tablet Take 1 tablet by mouth 5 (five) times daily as needed.   potassium chloride SA (KLOR-CON) 20 MEQ tablet Take 1 tablet (20 mEq total) by mouth 2 (two) times daily.   Semaglutide, 1 MG/DOSE, (OZEMPIC, 1 MG/DOSE,) 2 MG/1.5ML SOPN Inject 2 mg into the skin once a week.   spironolactone (ALDACTONE) 25 MG tablet Take 1 tablet (25 mg total) by mouth daily.   telmisartan (MICARDIS) 40 MG tablet TAKE ONE TABLET BY MOUTH ONCE DAILY   thiamine 100 MG tablet Take 1 tablet (100 mg total) by mouth daily.   triamcinolone cream (KENALOG) 0.5 % Apply 1 application topically 3 (three) times daily.   warfarin (COUMADIN) 3 MG tablet TAKE ONE TABLET BY MOUTH AS DIRECTED by clinic   No facility-administered encounter medications on file as of 09/03/2020.    Patient Active Problem List   Diagnosis Date Noted   Poor dentition requiring referral to dentistry 03/26/2020   Thiamine deficiency 12/30/2018   Vitamin B12 deficiency anemia due to intrinsic factor deficiency 12/20/2018   CHF (congestive heart failure), NYHA class I, chronic, combined ([place]) 12/19/2018   Coronary artery disease involving native coronary artery of native heart with angina pectoris ([place]) 12/19/2018   Obesity with body mass index (BMI) of 30.0 to 39.9 01/15/2018   Chronic idiopathic constipation 12/27/2017   Atrial fibrillation ([place]) 10/30/2017   Type 2 diabetes mellitus ([place]) 10/30/2017   Eczema 08/07/2017   Insomnia secondary to chronic pain 08/04/2016   Visit for screening mammogram 04/14/2016   LV dysfunction    Routine general medical  examination at a health care facility 05/03/2013   DM (diabetes mellitus), type 2 with peripheral vascular complications ([place]) 72/11/4707   PVC's/nonsustained VT 10/24/2012   H/O: CVA (cerebrovascular accident) 02/09/2012   Hypothyroidism 05/27/2011   Fibromyalgia 10/08/2010   Long term current use of anticoagulant 05/05/2010   Low back pain, non-specific 05/12/2008   Cognitive dysfunction associated with depression 01/29/2007   Osteoarthritis 11/22/2006   Hyperlipidemia associated with type 2 diabetes mellitus ([place]) 09/13/2006   Coronary atherosclerosis 09/13/2006   Paroxysmal atrial fibrillation ([place]) 09/13/2006   GERD 09/13/2006   Hypertension associated with diabetes ([place]) 08/30/2006    Conditions to be addressed/monitored: DMII, Anxiety, and Depression; Mental Health Concerns   Care Plan : LCSW Plan of Care  Updates made by Deirdre Peer, LCSW since 09/04/2020 12:00 AM     Problem: Lacks strategy for management of anxiety around sleeping and fear of dying.   Priority: High     Long-Range Goal: Establish and Maintain plan for anxiety management   Start Date: 04/09/2020  Expected End Date: 10/03/2020  This Visit's Progress: On track  Recent Progress: On track  Priority: High  Note:   Current Barriers:  Pt does not feel she needs therapy/counseling; stating, "I have been going through a lot of this for 52 years"  Pt acknowledges grief from the loss of her grandson- appears to be coping in a normal/healthy manner Chronic Mental Health needs related to anxiety/PTSD - has participated in an initial visit with [facility] clinician and was not set up for follow up. Pt has decided to pause and does not plan on re-scheduling and/or connecting with new mental health therapist at this time. Limited social support, Mental Health Concerns , and Family and relationship dysfunction Suicidal Ideation/Homicidal Ideation: No Pt shares she is a bit overwhelmed by the pile of mail she has to go  through and concerns for disposing of the personal documents-she continues to work on preparing them for shredding/trash Clinical Social Work Goal(s):  Over the next 30 days pt will begin to go through [employer] of mail and organize a pile of papers that need to be shredded for privacy/confidentiality and those that can just be recycled or trashed.  Pt will continue to focus on positive self talk and thought patterns, not over-reacting, and seeking ways to better cope with uncomfortable feelings as they arise Over the next 90 days, patient will work with SW monthly by telephone to reduce or manage symptoms related to anxiety Interventions: Pt shared with CSW that the family has had an unexpected death and she and her husband and making plans to go to the funeral in [place]. "We are going to take the train because my husband won't fly". Pt expressed some frustration and concerns related to the train and her pain issues.  Pt is wanting to talk to her PCP about seeing a Nutritionist and weight loss.  CSW validated pt; "He won't be happy with my A1C".  CSW supported pt and her awareness and wanting to seek change. Pt will see PCP 7/18 and plans to discuss.  She continues to feel she does not want or need counseling and knows how to seek this if she decides she does.     Solution-Focused Strategies, Mindfulness or Psychologist, educational, Active listening / Reflection utilized , Emotional Supportive Provided, Problem Solving [facility] , Psychoeducation for mental health needs , Motivational Interviewing, and Brief CBT  Patient Coping Strengths:  Spirituality Hopefulness Self Advocate Able to Communicate Effectively [place] and trust with her Psychiatrist and PCP Patient Self Care Deficits:  Lacks social connections Patient Goals:   - Continue with visits with  Dr Casimiro Needle and further discuss with him about resuming therapy if you feel needed. - practice relaxation or meditation  -continue with positive self  talk, "don't sweat the small stuff", and allowing yourself to be more aware of your emotions and actions that come - reach out to CSW and/or Dr Casimiro Needle if needs arise and want to pursue counseling appointment before scheduled visit with Dr Casimiro Needle  Follow Up Plan: Appointment scheduled for SW follow up with client by phone on:  09/22/2020       Follow Up Plan: Appointment scheduled for SW follow up with client by phone on: 09/22/20      Eduard Clos MSW, [place] Licensed Clinical Social Worker [place] [phone]

## 2020-09-09 ENCOUNTER — Ambulatory Visit: Payer: Medicare Other | Admitting: Internal Medicine

## 2020-09-10 ENCOUNTER — Telehealth: Payer: Self-pay | Admitting: Pharmacist

## 2020-09-10 NOTE — Progress Notes (Signed)
Chronic Care Management Pharmacy Assistant   Name: Kristen [name]  MRN: [id number] DOB: December 09, 1945   Reason for Encounter: Medication Review    Medications: Outpatient Encounter Medications as of 09/10/2020  Medication Sig   ALPRAZolam (XANAX) 1 MG tablet Take 1 tablet (1 mg total) by mouth 3 (three) times daily as needed for anxiety.   Ascorbic Acid (VITAMIN C) 1000 MG tablet Take 1,000 mg by mouth daily.   atorvastatin (LIPITOR) 40 MG tablet TAKE ONE TABLET BY MOUTH ONCE DAILY   Blood Glucose Monitoring Suppl (ONE TOUCH ULTRA 2) w/Device KIT Use as advised   Cholecalciferol (VITAMIN D3) 50 MCG (2000 UT) capsule Take 2,000 Units by mouth daily.   cyclobenzaprine (FLEXERIL) 10 MG tablet Take 10 mg by mouth 3 (three) times daily as needed for muscle spasms.   empagliflozin (JARDIANCE) 25 MG TABS tablet Take 1 tablet (25 mg total) by mouth daily. Via BI CARES pt assistance   furosemide (LASIX) 20 MG tablet Take 1 tablet (20 mg total) by mouth every morning.   gabapentin (NEURONTIN) 300 MG capsule Take 1 capsule (300 mg total) by mouth 3 (three) times daily.   glucose blood (COOL BLOOD GLUCOSE TEST STRIPS) test strip Use to test blood sugar twice daily. DX: E11.9   insulin glargine, 2 Unit Dial, (TOUJEO MAX SOLOSTAR) 300 UNIT/ML Solostar Pen Inject 50 Units into the skin daily.   Insulin Pen Needle (NOVOFINE) 32G X 6 MM MISC 1 Act by Does not apply route daily.   Lancets (ONETOUCH ULTRASOFT) lancets Use as instructed   magnesium oxide (MAG-OX) 400 MG tablet Take 1 tablet (400 mg total) by mouth 2 (two) times daily. WITH BREAKFAST AND EVENING MEALS.   metFORMIN (GLUCOPHAGE-XR) 500 MG 24 hr tablet TAKE TWO TABLETS BY MOUTH TWICE DAILY WITH FOOD   metoprolol succinate (TOPROL-XL) 100 MG 24 hr tablet Take 1 tablet (100 mg total) by mouth 2 (two) times daily. Take with or immediately following a meal.   Multiple Vitamin (MULTIVITAMIN) tablet Take 1 tablet by mouth daily.   nitroGLYCERIN  (NITROSTAT) 0.4 MG SL tablet Place 1 tablet (0.4 mg total) under the tongue every 5 (five) minutes as needed for chest pain (x 3 doses). Reported on 05/08/2015   ondansetron (ZOFRAN-ODT) 4 MG disintegrating tablet Take 1 tablet (4 mg total) by mouth every 8 (eight) hours as needed for nausea or vomiting.   PERCOCET 10-325 MG tablet Take 1 tablet by mouth 5 (five) times daily as needed.   potassium chloride SA (KLOR-CON) 20 MEQ tablet Take 1 tablet (20 mEq total) by mouth 2 (two) times daily.   Semaglutide, 1 MG/DOSE, (OZEMPIC, 1 MG/DOSE,) 2 MG/1.5ML SOPN Inject 2 mg into the skin once a week.   spironolactone (ALDACTONE) 25 MG tablet Take 1 tablet (25 mg total) by mouth daily.   telmisartan (MICARDIS) 40 MG tablet TAKE ONE TABLET BY MOUTH ONCE DAILY   thiamine 100 MG tablet Take 1 tablet (100 mg total) by mouth daily.   triamcinolone cream (KENALOG) 0.5 % Apply 1 application topically 3 (three) times daily.   warfarin (COUMADIN) 3 MG tablet TAKE ONE TABLET BY MOUTH AS DIRECTED by clinic   No facility-administered encounter medications on file as of 09/10/2020.   Pharmacist Review  Reviewed chart for medication changes ahead of medication coordination call.  No OVs, Consults, or hospital visits since last care coordination call/Pharmacist visit. (If appropriate, list visit date, provider name)  No medication changes indicated OR if recent  visit, treatment plan here.  BP Readings from Last 3 Encounters:  05/13/20 110/72  03/26/20 138/70  11/19/19 104/66    Lab Results  Component Value Date   HGBA1C 8.0 (A) 03/26/2020     Patient obtains medications through Vials  90 Days   Last adherence delivery included:  Telmisartan 40 mg 1 tab daily 07/30/20 Mag Oxide 400 mg 1 tab twice daily 07/27/20 Metoprolol succinate 100 mg 1 tab twice daily 07/27/20 Atorvastatin 40 mg 1 tab daily 07/27/20 Potassium CL ER 20 MEq 1 tab twice daily 07/08/20 Metformin Er 500 mg 2 tabs twice daily 07/08/20  Patient is  due for next adherence delivery on: 09/14/20. Called patient and reviewed medications and coordinated delivery.  This delivery to include: Mag Oxide 400 mg 1 tab twice daily 07/27/20 Metoprolol succinate 100 mg 1 tab twice daily 07/27/20 Atorvastatin 40 mg 1 tab daily 07/27/20 Potassium CL ER 20 MEq 1 tab twice daily 07/08/20 Metformin Er 500 mg 2 tabs twice daily 07/08/20 Furosemide 20 mg 1 tab daily Gabapentin 300 mg prn   Patient declined the following medications telmisartan, warfarin and spironolactone, patient states she has enough on hand  Patient needs refills for None ID.  Confirmed delivery date of 09/14/20, advised patient that pharmacy will contact them the morning of delivery.   [place] Pharmacist Assistant [phone]   Time spent:43

## 2020-09-16 ENCOUNTER — Ambulatory Visit (INDEPENDENT_AMBULATORY_CARE_PROVIDER_SITE_OTHER): Payer: Medicare Other | Admitting: *Deleted

## 2020-09-16 DIAGNOSIS — F32A Depression, unspecified: Secondary | ICD-10-CM

## 2020-09-16 DIAGNOSIS — E1151 Type 2 diabetes mellitus with diabetic peripheral angiopathy without gangrene: Secondary | ICD-10-CM

## 2020-09-16 DIAGNOSIS — I5042 Chronic combined systolic (congestive) and diastolic (congestive) heart failure: Secondary | ICD-10-CM

## 2020-09-16 DIAGNOSIS — F419 Anxiety disorder, unspecified: Secondary | ICD-10-CM | POA: Diagnosis not present

## 2020-09-16 NOTE — Chronic Care Management (AMB) (Signed)
Chronic Care Management   CCM RN Visit Note  09/16/2020 Name: Kristen [name] MRN: [id number] DOB: [birth year]  Subjective: Kristen [name] is a 75 y.o. year old female who is a primary care patient of [name], MD. The care management team was consulted for assistance with disease management and care coordination needs.    Engaged with patient by telephone for follow up visit in response to provider referral for case management and/or care coordination services.   Consent to Services:  The patient was given information about Chronic Care Management services, agreed to services, and gave verbal consent prior to initiation of services.  Please see initial visit note for detailed documentation.  Patient agreed to services and verbal consent obtained.   Assessment: Review of patient past medical history, allergies, medications, health status, including review of consultants reports, laboratory and other test data, was performed as part of comprehensive evaluation and provision of chronic care management services.   CCM Care Plan  Allergies  Allergen Reactions   Metformin And Related Other (See Comments)    Must take XR form only, cannot tolerate Regular release metformin   Cleocin [Clindamycin Hcl] Diarrhea   Codeine Itching   Doxycycline Hyclate Diarrhea   Macrolides And Ketolides Diarrhea   Morphine Hives   Pentazocine Lactate Itching and Nausea And Vomiting    (GENERIC- [place])   Vibramycin [Doxycycline Calcium] Diarrhea and Rash   Clindamycin Diarrhea   Definity [Perflutren Lipid Microsphere] Other (See Comments)    Patient complained of warm sensation in chest x few seconds duration when injected Definity.No other symptoms   Doxycycline Diarrhea   Metformin Other (See Comments)   Pentazocine Nausea Only   Perflutren Other (See Comments)   Perflutren Lipid Microspheres Other (See Comments)   Sulfa Antibiotics Diarrhea and Rash   Sulfonamide Derivatives Rash     Outpatient Encounter Medications as of 09/16/2020  Medication Sig   ALPRAZolam (XANAX) 1 MG tablet Take 1 tablet (1 mg total) by mouth 3 (three) times daily as needed for anxiety.   Ascorbic Acid (VITAMIN C) 1000 MG tablet Take 1,000 mg by mouth daily.   atorvastatin (LIPITOR) 40 MG tablet TAKE ONE TABLET BY MOUTH ONCE DAILY   Blood Glucose Monitoring Suppl (ONE TOUCH ULTRA 2) w/Device KIT Use as advised   Cholecalciferol (VITAMIN D3) 50 MCG (2000 UT) capsule Take 2,000 Units by mouth daily.   cyclobenzaprine (FLEXERIL) 10 MG tablet Take 10 mg by mouth 3 (three) times daily as needed for muscle spasms.   empagliflozin (JARDIANCE) 25 MG TABS tablet Take 1 tablet (25 mg total) by mouth daily. Via BI CARES pt assistance   furosemide (LASIX) 20 MG tablet Take 1 tablet (20 mg total) by mouth every morning.   gabapentin (NEURONTIN) 300 MG capsule Take 1 capsule (300 mg total) by mouth 3 (three) times daily.   glucose blood (COOL BLOOD GLUCOSE TEST STRIPS) test strip Use to test blood sugar twice daily. DX: E11.9   insulin glargine, 2 Unit Dial, (TOUJEO MAX SOLOSTAR) 300 UNIT/ML Solostar Pen Inject 50 Units into the skin daily.   Insulin Pen Needle (NOVOFINE) 32G X 6 MM MISC 1 Act by Does not apply route daily.   Lancets (ONETOUCH ULTRASOFT) lancets Use as instructed   magnesium oxide (MAG-OX) 400 MG tablet Take 1 tablet (400 mg total) by mouth 2 (two) times daily. WITH BREAKFAST AND EVENING MEALS.   metFORMIN (GLUCOPHAGE-XR) 500 MG 24 hr tablet TAKE TWO TABLETS BY MOUTH TWICE DAILY  WITH FOOD   metoprolol succinate (TOPROL-XL) 100 MG 24 hr tablet Take 1 tablet (100 mg total) by mouth 2 (two) times daily. Take with or immediately following a meal.   Multiple Vitamin (MULTIVITAMIN) tablet Take 1 tablet by mouth daily.   nitroGLYCERIN (NITROSTAT) 0.4 MG SL tablet Place 1 tablet (0.4 mg total) under the tongue every 5 (five) minutes as needed for chest pain (x 3 doses). Reported on 05/08/2015    ondansetron (ZOFRAN-ODT) 4 MG disintegrating tablet Take 1 tablet (4 mg total) by mouth every 8 (eight) hours as needed for nausea or vomiting.   PERCOCET 10-325 MG tablet Take 1 tablet by mouth 5 (five) times daily as needed.   potassium chloride SA (KLOR-CON) 20 MEQ tablet Take 1 tablet (20 mEq total) by mouth 2 (two) times daily.   Semaglutide, 1 MG/DOSE, (OZEMPIC, 1 MG/DOSE,) 2 MG/1.5ML SOPN Inject 2 mg into the skin once a week.   spironolactone (ALDACTONE) 25 MG tablet Take 1 tablet (25 mg total) by mouth daily.   telmisartan (MICARDIS) 40 MG tablet TAKE ONE TABLET BY MOUTH ONCE DAILY   thiamine 100 MG tablet Take 1 tablet (100 mg total) by mouth daily.   triamcinolone cream (KENALOG) 0.5 % Apply 1 application topically 3 (three) times daily.   warfarin (COUMADIN) 3 MG tablet TAKE ONE TABLET BY MOUTH AS DIRECTED by clinic   No facility-administered encounter medications on file as of 09/16/2020.    Patient Active Problem List   Diagnosis Date Noted   Poor dentition requiring referral to dentistry 03/26/2020   Thiamine deficiency 12/30/2018   Vitamin B12 deficiency anemia due to intrinsic factor deficiency 12/20/2018   CHF (congestive heart failure), NYHA class I, chronic, combined ([place]) 12/19/2018   Coronary artery disease involving native coronary artery of native heart with angina pectoris ([place]) 12/19/2018   Obesity with body mass index (BMI) of 30.0 to 39.9 01/15/2018   Chronic idiopathic constipation 12/27/2017   Atrial fibrillation ([place]) 10/30/2017   Type 2 diabetes mellitus ([place]) 10/30/2017   Eczema 08/07/2017   Insomnia secondary to chronic pain 08/04/2016   Visit for screening mammogram 04/14/2016   LV dysfunction    Routine general medical examination at a health care facility 05/03/2013   DM (diabetes mellitus), type 2 with peripheral vascular complications ([place]) 37/16/9678   PVC's/nonsustained VT 10/24/2012   H/O: CVA (cerebrovascular accident) 02/09/2012    Hypothyroidism 05/27/2011   Fibromyalgia 10/08/2010   Long term current use of anticoagulant 05/05/2010   Low back pain, non-specific 05/12/2008   Cognitive dysfunction associated with depression 01/29/2007   Osteoarthritis 11/22/2006   Hyperlipidemia associated with type 2 diabetes mellitus ([place]) 09/13/2006   Coronary atherosclerosis 09/13/2006   Paroxysmal atrial fibrillation ([place]) 09/13/2006   GERD 09/13/2006   Hypertension associated with diabetes ([place]) 08/30/2006    Conditions to be addressed/monitored:  CHF and DMII  Care Plan : Diabetes Type 2 (Adult)  Updates made by Knox Royalty, RN since 09/16/2020 12:00 AM     Problem: Glycemic Management (Diabetes, Type 2)   Priority: High     Long-Range Goal: Glycemic Management Optimized   Start Date: 05/01/2020  Expected End Date: 10/29/2020  This Visit's Progress: On track  Recent Progress: On track  Priority: High  Note:   Objective:  Lab Results  Component Value Date   HGBA1C 8.0 (A) 03/26/2020   Lab Results  Component Value Date   CREATININE 0.90 10/16/2019   CREATININE 0.86 10/10/2019   CREATININE 0.86 04/11/2019  No results found for: EGFR Current Barriers:  Knowledge Deficits related to basic Diabetes pathophysiology, self care/management, and appropriate diet for self-management of DM Difficulty obtaining or cannot afford medications- confirmed CCM Pharmacist [name] currently active for PAP and medication optimization Unable to independently verbalize appropriate choices for DM soft diet  Does not adhere to provider recommendations re: following low carb/ sugar diet Case Manager Clinical Goal(s):  Over the next 6 months, patient will demonstrate improved adherence to prescribed treatment plan for diabetes self care/management as evidenced by:  daily monitoring and recording of CBG- on track adherence to ADA/ carb modified diet- on track, continues to require education/ reinforcement  contacting provider for  new or worsened symptoms or questions- denies today Interventions:  Review of patient status, including review of consultants reports, relevant laboratory and other test results, and medications completed. Collaboration with [name], MD regarding development and update of comprehensive plan of care as evidenced by provider attestation and co-signature Inter-disciplinary care team collaboration (see longitudinal plan of care) Chart reviewed including relevant office notes, upcoming scheduled appointments, and lab results Discussed current clinical condition and confirmed no current clinical concerns- patient reports she has just returned from out of town trip and she thinks I am the CCM CSW- she begins telling me that she believes her husband is suffering from PTSD and has "occasionally lashed out" at her; despite my introducing myself at the outset of the call, patient appears very confused about the different CCM team members involved in her care: I clarified the CCM program and provided her all of the names on her care management team; she is concerned that she missed a phone call from one of the CCM team members and will be charged for this missed visit: I explained that each telephone appointment is considered an appointment, and that to my knowledge, she will not be charged for any missed CCM care management appointment.  I encouraged patient to discuss her concerns with her husband's "lashing out" with Marcie Bal at time of CCM CSW next scheduled phone call- clarified with patient that the appointment is scheduled for September 22, 2020 and will be by telephone.  I confirmed that the patient does not feel that she is in immediate danger due to husband's "lashing out;" she assures me she is not, and adds that she continues her regular psychiatric appointments with Dr. Casimiro Needle as well-- she was encouraged to maintain these care provider relationships Patient reports she is currently at "a store" sitting in  the car, awaiting her husband to return from shopping; she reports she does not have her blood sugars with her for review, however, declines making another appointment when she will be her near her recorded blood sugar values; she reports that her general fasting ranges are "always between 110-124" and that her "after-eating" blood sugars are generally between 200-250; confirms she has not made much progress in her dietary habits, due to poor dentition and having to eat a soft diet: she was again encouraged to begin trying options we have previously discussed Reinforced previously provided education around diabetic-appropriate diet, given ongoing dental issues- encouraged patient to continue her efforts and to frequently re-review previously provided education around diet choices, need to gradually try adding new food options from list provided to her, and need to promptly address dental issues; she declines need for additional educational material today, states she "will wait" to see what her updated A1-C level is, which she expects to be completed on 09/21/20 during PCP  office visit Confirms no current medication concerns today- encouraged her to remain in contact with CCM Pharmacist [name] Discussed upcoming appointment with Dr. Ronnald Ramp, scheduled September 21, 2020- she verbalizes plans to attend as scheduled Given patient is not at home and is unable to review blood sugars, we scheduled next phone call in one month to review PCP office visit, possible updated A1-C, and blood sugars from home Discussed/ reminded patient of ongoing need for annual eye and foot exams: encouraged patient to schedule these appointments soon, she is agreeable Reviewed upcoming provider appointments with patient and confirmed that patient has plans to attend all as scheduled: PCP office visit 09/21/20; CCM CSW call 09/22/20; Coumadin clinic 09/29/20; and CCM Pharmacist call 10/05/20 Discussed plans with patient for ongoing care management  follow up and provided patient with direct contact information for care management team Patient Goals: Continue checking blood sugar twice daily and begin writing down these values from home monitoring: check first thing in the morning before you eat and again 2 hours after a large meal Check blood sugar if I feel it is too high or too low Obtain a dedicated notebook or piece of paper to write down blood sugars so we can review these during our future phone calls  Take the blood sugar log to all doctor visits- this will help the doctor make decisions about your treatment plan and medications Please stay in  touch with the Pharmacist [name]) and the Social Worker Marcie Bal) at Dr. Stevenson Clinch office that are calling you regularly- along with myself, we are all a part of care management team Your next appointment with Dr. Ronnald Ramp is scheduled for September 21, 2020 Contact your care providers if you have any questions or concerns about your blood sugar values at home or you medications Frequently review the information you were sent about choices of food that are good to eat when you have diabetes, continue your efforts to begin making small changes to your soft diet to decrease you sugar and carbohydrate intake at home: we will talk about this during our future phone calls Make an appointment with your podiatry provider as we discussed Make an appointment for your annual eye exam Self-Care Activities UNABLE to independently verbalize appropriate dietary choices for DM in setting of poor dentition/ soft diet: patient will continue to require reinforcement Attends all scheduled provider appointments Follow Up Plan:  Telephone follow up appointment with care management team member scheduled for: Tuesday October 20, 2020 at 2:00 pm The patient has been provided with contact information for the care management team and has been advised to call with any health related questions or concerns.      Care Plan : Heart  Failure (Adult)  Updates made by Knox Royalty, RN since 09/16/2020 12:00 AM     Problem: Symptom Exacerbation (Heart Failure)   Priority: Medium     Long-Range Goal: Symptom Exacerbation Prevented or Minimized   Start Date: 05/01/2020  Expected End Date: 10/29/2020  This Visit's Progress: On track  Recent Progress: On track  Priority: Medium  Note:   Current Barriers:  Knowledge deficit related to basic heart failure pathophysiology and self care management- needs ongoing reinforcement Unable to independently verbalize comprehensive signs/ symptoms of HF exacerbation Case Manager Clinical Goal(s):  Over the next 6 months patient will: Verbalize understanding of symptoms associated with Heart Failure and follow Action Plan and when to call doctor- on track Continue monitoring/ recording daily weight daily (notifying MD of 3 lb weight  gain over night or 5 lb in a week)- on track Interventions:  Collaboration with [name], MD regarding development and update of comprehensive plan of care as evidenced by provider attestation and co-signature Inter-disciplinary care team collaboration (see longitudinal plan of care) Chart reviewed including relevant office notes, upcoming scheduled appointments, and lab results Discussed current  clinical condition with patient and confirmed no current clinical concerns; confirmed patient continues to monitor/ record daily weights at home; denies shortness of breath, LE swelling outside of baseline Reviewed recent weights at home with patient: she reports consistent ranges between 206-208 lbs, consistent with previously reported ranges, and actually slightly lower than last reported weight in April Confirmed she continues to follow low-salt diet- positive reinforcement provided Briefly reviewed signs/ symptoms CHF zones along with corresponding action plan: patient will need ongoing reinforcement of this conceptual action plan  Reinforced basic  action plan for weight gain in setting of CHF with patient: she reports ongoing need for reinforcement/ reminding Patient Goals/Self-Care Activities Continue to weigh myself daily and track weight in diary or on a dedicated calendar: we will review your weights during our future phone calls You reported today general weight ranges between 206-208 lbs: this is right in the range you reported last month, and is actually a little bit less than what you reported back in April: keep up the great work keeping your daily weights in range Call cardiologist/ care providers if I gain more than 2 pounds in one day or 5 pounds in one week Use salt in moderation Watch for swelling in feet, ankles and legs every day Please keep your cardiologist updated if you develop concerns or have changes in your clinical status Today, you reported weights at home consistently between 206-208 lbs Follow Up Plan:  Telephone follow up appointment with care management team member scheduled for: Tuesday, October 20, 2020 at 2:00 pm The patient has been provided with contact information for the care management team and has been advised to call with any health related questions or concerns.      Plan: Telephone follow up appointment with care management team member scheduled for:  Tuesday, October 20, 2020 at 2:00 pm The patient has been provided with contact information for the care management team and has been advised to call with any health related questions or concerns  [name], RN, BSN, [place] [phone]: direct office [phone]: mobile

## 2020-09-16 NOTE — Patient Instructions (Signed)
Visit Information  Kristen [name], it was nice talking with you today.   Please read over the attached information, and keep up the great work continuing to monitor and record your daily blood sugars and weights at home: we will review these during each of our future phone call appointments   Please stay in touch with the Care Management team that is involved in your care: the Social Worker Marcie Bal) and the Pharmacist [name])   I look forward to talking to you again for an update on Tuesday, October 20, 2020 at 2:00 pm- please be listening out for my call that day.  I will call as close to 2:00 pm as possible; I look forward to hearing about your progress.   Please don't hesitate to contact me if I can be of assistance to you before our next scheduled appointment.   [name], RN, BSN, [facility] RN Care Coordination- [place] [phone]: direct office [phone]: mobile    PATIENT GOALS:  Goals Addressed             This Visit's Progress    Monitor and Manage My Blood Sugar-Diabetes Type 2   On track    Timeframe:  Long-Range Goal Priority:  High Start Date:       05/01/20                      Expected End Date:   10/29/20                    Follow Up Date 10/20/20 at 2:00 pm   Continue checking blood sugar twice daily and begin writing down these values from home monitoring: check first thing in the morning before you eat and again 2 hours after a large meal Check blood sugar if I feel it is too high or too low Obtain a dedicated notebook or piece of paper to write down blood sugars so we can review these during our future phone calls  Take the blood sugar log to all doctor visits- this will help the doctor make decisions about your treatment plan and medications Please stay in  touch with the Pharmacist [name]) and the Social Worker Marcie Bal) at Dr. Stevenson Clinch office that are calling you regularly- along with myself, we are all a part of care management  team Your next appointment with Dr. Ronnald Ramp is scheduled for September 21, 2020 Contact your care providers if you have any questions or concerns about your blood sugar values at home or you medications Frequently review the information you were sent about choices of food that are good to eat when you have diabetes, continue your efforts to begin making small changes to your soft diet to decrease you sugar and carbohydrate intake at home: we will talk about this during our future phone calls Make an appointment with your podiatry provider as we discussed Make an appointment for your annual eye exam   Why is this important?   Checking your blood sugar at home helps to keep it from getting very high or very low.  Writing the results in a diary or log helps the doctor know how to care for you.  Your blood sugar log should have the time, date and the results.  Also, write down the amount of insulin or other medicine that you take.  Other information, like what you ate, exercise done and how you were feeling, will also be helpful.  Track and Manage Fluids and Swelling-Heart Failure   On track    Timeframe:  Long-Range Goal Priority:  Medium Start Date:        05/01/20                     Expected End Date:   10/29/20                    Follow Up Date 10/20/20 at 2:00 pm  Continue to weigh myself daily and track weight in diary or on a dedicated calendar: we will review your weights during our future phone calls You reported today general weight ranges between 206-208 lbs: this is right in the range you reported last month, and is actually a little bit less than what you reported back in April: keep up the great work keeping your daily weights in range Call cardiologist/ care providers if I gain more than 2 pounds in one day or 5 pounds in one week Use salt in moderation Watch for swelling in feet, ankles and legs every day Please keep your cardiologist updated if you develop concerns or have  changes in your clinical status Today, you reported weights at home consistently between 206-208 lbs   Why is this important?   It is important to check your weight daily and watch how much salt and liquids you have.  It will help you to manage your heart failure.            Patient verbalizes understanding of instructions provided today and agrees to view in [place].  Telephone follow up appointment with care management team member scheduled for:  Tuesday, October 20, 2020 at 2:00 pm The patient has been provided with contact information for the care management team and has been advised to call with any health related questions or concerns.   [name], RN, BSN, [facility] RN Care Coordination- [place] [phone]: direct office [phone]: mobile

## 2020-09-21 ENCOUNTER — Encounter: Payer: Self-pay | Admitting: Internal Medicine

## 2020-09-21 ENCOUNTER — Ambulatory Visit (INDEPENDENT_AMBULATORY_CARE_PROVIDER_SITE_OTHER): Payer: Medicare Other | Admitting: Internal Medicine

## 2020-09-21 ENCOUNTER — Other Ambulatory Visit: Payer: Self-pay

## 2020-09-21 VITALS — BP 132/74 | HR 86 | Temp 98.1°F | Ht 61.0 in | Wt 201.0 lb

## 2020-09-21 DIAGNOSIS — I480 Paroxysmal atrial fibrillation: Secondary | ICD-10-CM | POA: Diagnosis not present

## 2020-09-21 DIAGNOSIS — E519 Thiamine deficiency, unspecified: Secondary | ICD-10-CM

## 2020-09-21 DIAGNOSIS — E1151 Type 2 diabetes mellitus with diabetic peripheral angiopathy without gangrene: Secondary | ICD-10-CM | POA: Diagnosis not present

## 2020-09-21 DIAGNOSIS — D510 Vitamin B12 deficiency anemia due to intrinsic factor deficiency: Secondary | ICD-10-CM

## 2020-09-21 DIAGNOSIS — I152 Hypertension secondary to endocrine disorders: Secondary | ICD-10-CM

## 2020-09-21 DIAGNOSIS — E1169 Type 2 diabetes mellitus with other specified complication: Secondary | ICD-10-CM | POA: Diagnosis not present

## 2020-09-21 DIAGNOSIS — E039 Hypothyroidism, unspecified: Secondary | ICD-10-CM | POA: Diagnosis not present

## 2020-09-21 DIAGNOSIS — E1159 Type 2 diabetes mellitus with other circulatory complications: Secondary | ICD-10-CM

## 2020-09-21 DIAGNOSIS — Z0000 Encounter for general adult medical examination without abnormal findings: Secondary | ICD-10-CM

## 2020-09-21 DIAGNOSIS — E785 Hyperlipidemia, unspecified: Secondary | ICD-10-CM | POA: Diagnosis not present

## 2020-09-21 DIAGNOSIS — Z23 Encounter for immunization: Secondary | ICD-10-CM

## 2020-09-21 LAB — BASIC METABOLIC PANEL
BUN: 19 mg/dL (ref 6–23)
CO2: 22 mEq/L (ref 19–32)
Calcium: 10.1 mg/dL (ref 8.4–10.5)
Chloride: 103 mEq/L (ref 96–112)
Creatinine, Ser: 0.83 mg/dL (ref 0.40–1.20)
GFR: 70.27 mL/min (ref >60.00)
Glucose, Bld: 131 mg/dL — ABNORMAL HIGH (ref 70–99)
Potassium: 4.3 mEq/L (ref 3.5–5.1)
Sodium: 137 mEq/L (ref 135–145)

## 2020-09-21 LAB — URINALYSIS, ROUTINE W REFLEX MICROSCOPIC
Bilirubin Urine: NEGATIVE
Ketones, ur: NEGATIVE
Nitrite: NEGATIVE
Specific Gravity, Urine: 1.02 (ref 1.000–1.030)
Total Protein, Urine: NEGATIVE
Urine Glucose: >=1000 — AB
Urobilinogen, UA: 0.2 (ref 0.0–1.0)
pH: 5.5 (ref 5.0–8.0)

## 2020-09-21 LAB — CBC WITH DIFFERENTIAL/PLATELET
Basophils Absolute: 0 10*3/uL (ref 0.0–0.1)
Basophils Relative: 0.5 % (ref 0.0–3.0)
Eosinophils Absolute: 0.1 10*3/uL (ref 0.0–0.7)
Eosinophils Relative: 1 % (ref 0.0–5.0)
HCT: 39.4 % (ref 36.0–46.0)
Hemoglobin: 12.6 g/dL (ref 12.0–15.0)
Lymphocytes Relative: 39.3 % (ref 12.0–46.0)
Lymphs Abs: 2.4 10*3/uL (ref 0.7–4.0)
MCHC: 32 g/dL (ref 30.0–36.0)
MCV: 83.7 fl (ref 78.0–100.0)
Monocytes Absolute: 0.6 10*3/uL (ref 0.1–1.0)
Monocytes Relative: 9.5 % (ref 3.0–12.0)
Neutro Abs: 3 10*3/uL (ref 1.4–7.7)
Neutrophils Relative %: 49.7 % (ref 43.0–77.0)
Platelets: 238 10*3/uL (ref 150.0–400.0)
RBC: 4.7 Mil/uL (ref 3.87–5.11)
RDW: 18.2 % — ABNORMAL HIGH (ref 11.5–15.5)
WBC: 6 10*3/uL (ref 4.0–10.5)

## 2020-09-21 LAB — LIPID PANEL
Cholesterol: 98 mg/dL (ref 0–200)
HDL: 34.6 mg/dL — ABNORMAL LOW (ref >39.00)
LDL Cholesterol: 39 mg/dL (ref 0–99)
NonHDL: 63.81
Total CHOL/HDL Ratio: 3
Triglycerides: 126 mg/dL (ref 0.0–149.0)
VLDL: 25.2 mg/dL (ref 0.0–40.0)

## 2020-09-21 LAB — MICROALBUMIN / CREATININE URINE RATIO
Creatinine,U: 64.4 mg/dL
Microalb Creat Ratio: 1.1 mg/g (ref 0.0–30.0)
Microalb, Ur: <0.7 mg/dL (ref 0.0–1.9)

## 2020-09-21 LAB — FOLATE: Folate: 12.2 ng/mL (ref >5.9)

## 2020-09-21 LAB — HEPATIC FUNCTION PANEL
ALT: 14 U/L (ref 0–35)
AST: 18 U/L (ref 0–37)
Albumin: 4.5 g/dL (ref 3.5–5.2)
Alkaline Phosphatase: 50 U/L (ref 39–117)
Bilirubin, Direct: 0.1 mg/dL (ref 0.0–0.3)
Total Bilirubin: 0.5 mg/dL (ref 0.2–1.2)
Total Protein: 8 g/dL (ref 6.0–8.3)

## 2020-09-21 LAB — TSH: TSH: 2.26 u[IU]/mL (ref 0.35–5.50)

## 2020-09-21 LAB — HEMOGLOBIN A1C: Hgb A1c MFr Bld: 8.6 % — ABNORMAL HIGH (ref 4.6–6.5)

## 2020-09-21 MED ORDER — SHINGRIX 50 MCG/0.5ML IM SUSR: 0.5000 mL | Freq: Once | INTRAMUSCULAR | 1 refills | Status: AC

## 2020-09-21 MED ORDER — CYANOCOBALAMIN 1000 MCG/ML IJ SOLN
1000.0000 ug | Freq: Once | INTRAMUSCULAR | Status: AC
  Administered 2020-09-21: 1000 ug via INTRAMUSCULAR

## 2020-09-21 MED ORDER — OZEMPIC (2 MG/DOSE) 8 MG/3ML ~~LOC~~ SOPN: 2.0000 mg | PEN_INJECTOR | SUBCUTANEOUS | 1 refills | Status: DC

## 2020-09-21 NOTE — Patient Instructions (Signed)
Health Maintenance, Female Adopting a healthy lifestyle and getting preventive care are important in promoting health and wellness. Ask your health care provider about: The right schedule for you to have regular tests and exams. Things you can do on your own to prevent diseases and keep yourself healthy. What should I know about diet, weight, and exercise? Eat a healthy diet  Eat a diet that includes plenty of vegetables, fruits, low-fat dairy products, and lean protein. Do not eat a lot of foods that are high in solid fats, added sugars, or sodium.  Maintain a healthy weight Body mass index (BMI) is used to identify weight problems. It estimates body fat based on height and weight. Your health care provider can help determineyour BMI and help you achieve or maintain a healthy weight. Get regular exercise Get regular exercise. This is one of the most important things you can do for your health. Most adults should: Exercise for at least 150 minutes each week. The exercise should increase your heart rate and make you sweat (moderate-intensity exercise). Do strengthening exercises at least twice a week. This is in addition to the moderate-intensity exercise. Spend less time sitting. Even light physical activity can be beneficial. Watch cholesterol and blood lipids Have your blood tested for lipids and cholesterol at 73 years of age, then havethis test every 5 years. Have your cholesterol levels checked more often if: Your lipid or cholesterol levels are high. You are older than 73 years of age. You are at high risk for heart disease. What should I know about cancer screening? Depending on your health history and family history, you may need to have cancer screening at various ages. This may include screening for: Breast cancer. Cervical cancer. Colorectal cancer. Skin cancer. Lung cancer. What should I know about heart disease, diabetes, and high blood pressure? Blood pressure and heart  disease High blood pressure causes heart disease and increases the risk of stroke. This is more likely to develop in people who have high blood pressure readings, are of African descent, or are overweight. Have your blood pressure checked: Every 3-5 years if you are 18-39 years of age. Every year if you are 40 years old or older. Diabetes Have regular diabetes screenings. This checks your fasting blood sugar level. Have the screening done: Once every three years after age 40 if you are at a normal weight and have a low risk for diabetes. More often and at a younger age if you are overweight or have a high risk for diabetes. What should I know about preventing infection? Hepatitis B If you have a higher risk for hepatitis B, you should be screened for this virus. Talk with your health care provider to find out if you are at risk forhepatitis B infection. Hepatitis C Testing is recommended for: Everyone born from 1945 through 1965. Anyone with known risk factors for hepatitis C. Sexually transmitted infections (STIs) Get screened for STIs, including gonorrhea and chlamydia, if: You are sexually active and are younger than 73 years of age. You are older than 73 years of age and your health care provider tells you that you are at risk for this type of infection. Your sexual activity has changed since you were last screened, and you are at increased risk for chlamydia or gonorrhea. Ask your health care provider if you are at risk. Ask your health care provider about whether you are at high risk for HIV. Your health care provider may recommend a prescription medicine to help   prevent HIV infection. If you choose to take medicine to prevent HIV, you should first get tested for HIV. You should then be tested every 3 months for as long as you are taking the medicine. Pregnancy If you are about to stop having your period (premenopausal) and you may become pregnant, seek counseling before you get  pregnant. Take 400 to 800 micrograms (mcg) of folic acid every day if you become pregnant. Ask for birth control (contraception) if you want to prevent pregnancy. Osteoporosis and menopause Osteoporosis is a disease in which the bones lose minerals and strength with aging. This can result in bone fractures. If you are 65 years old or older, or if you are at risk for osteoporosis and fractures, ask your health care provider if you should: Be screened for bone loss. Take a calcium or vitamin D supplement to lower your risk of fractures. Be given hormone replacement therapy (HRT) to treat symptoms of menopause. Follow these instructions at home: Lifestyle Do not use any products that contain nicotine or tobacco, such as cigarettes, e-cigarettes, and chewing tobacco. If you need help quitting, ask your health care provider. Do not use street drugs. Do not share needles. Ask your health care provider for help if you need support or information about quitting drugs. Alcohol use Do not drink alcohol if: Your health care provider tells you not to drink. You are pregnant, may be pregnant, or are planning to become pregnant. If you drink alcohol: Limit how much you use to 0-1 drink a day. Limit intake if you are breastfeeding. Be aware of how much alcohol is in your drink. In the [place] one drink equals one 12 oz bottle of beer (355 mL), one 5 oz glass of wine (148 mL), or one 1 oz glass of hard liquor (44 mL). General instructions Schedule regular health, dental, and eye exams. Stay current with your vaccines. Tell your health care provider if: You often feel depressed. You have ever been abused or do not feel safe at home. Summary Adopting a healthy lifestyle and getting preventive care are important in promoting health and wellness. Follow your health care provider's instructions about healthy diet, exercising, and getting tested or screened for diseases. Follow your health care provider's  instructions on monitoring your cholesterol and blood pressure. This information is not intended to replace advice given to you by your health care provider. Make sure you discuss any questions you have with your healthcare provider. Document Revised: 02/14/2018 Document Reviewed: 02/14/2018 Elsevier Patient Education  2022 [employer] Inc[employer]  

## 2020-09-21 NOTE — Progress Notes (Signed)
Subjective:  Patient ID: Kristen [name], female    DOB: 1945-05-22  Age: 75 y.o. MRN: [id number]  CC: Annual Exam, Atrial Fibrillation, Hypertension, Hyperlipidemia, and Diabetes  This visit occurred during the SARS-CoV-2 public health emergency.  Safety protocols were in place, including screening questions prior to the visit, additional usage of staff PPE, and extensive cleaning of exam room while observing appropriate contact time as indicated for disinfecting solutions.    HPI Kristen [name] presents for a CPX and f/up -  She would like to get better control of her blood sugars but she cannot exercise because of musculoskeletal pain.  She denies polys, chest pain, shortness of breath, diaphoresis, edema, palpitations, or near syncope.  Outpatient Medications Prior to Visit  Medication Sig Dispense Refill   ALPRAZolam (XANAX) 1 MG tablet Take 1 tablet (1 mg total) by mouth 3 (three) times daily as needed for anxiety. 90 tablet 5   Ascorbic Acid (VITAMIN C) 1000 MG tablet Take 1,000 mg by mouth daily.     atorvastatin (LIPITOR) 40 MG tablet TAKE ONE TABLET BY MOUTH ONCE DAILY 90 tablet 1   Blood Glucose Monitoring Suppl (ONE TOUCH ULTRA 2) w/Device KIT Use as advised 1 each 0   Cholecalciferol (VITAMIN D3) 50 MCG (2000 UT) capsule Take 2,000 Units by mouth daily.     cyclobenzaprine (FLEXERIL) 10 MG tablet Take 10 mg by mouth 3 (three) times daily as needed for muscle spasms.     empagliflozin (JARDIANCE) 25 MG TABS tablet Take 1 tablet (25 mg total) by mouth daily. Via BI CARES pt assistance 90 tablet 1   furosemide (LASIX) 20 MG tablet Take 1 tablet (20 mg total) by mouth every morning. 90 tablet 1   gabapentin (NEURONTIN) 300 MG capsule Take 1 capsule (300 mg total) by mouth 3 (three) times daily. 270 capsule 0   glucose blood (COOL BLOOD GLUCOSE TEST STRIPS) test strip Use to test blood sugar twice daily. DX: E11.9 100 each 12   insulin glargine, 2 Unit Dial, (TOUJEO MAX  SOLOSTAR) 300 UNIT/ML Solostar Pen Inject 50 Units into the skin daily. 15 mL 5   Insulin Pen Needle (NOVOFINE) 32G X 6 MM MISC 1 Act by Does not apply route daily. 100 each 3   Lancets (ONETOUCH ULTRASOFT) lancets Use as instructed 100 each 12   magnesium oxide (MAG-OX) 400 MG tablet Take 1 tablet (400 mg total) by mouth 2 (two) times daily. WITH BREAKFAST AND EVENING MEALS. 180 tablet 3   metFORMIN (GLUCOPHAGE-XR) 500 MG 24 hr tablet TAKE TWO TABLETS BY MOUTH TWICE DAILY WITH FOOD 260 tablet 1   metoprolol succinate (TOPROL-XL) 100 MG 24 hr tablet Take 1 tablet (100 mg total) by mouth 2 (two) times daily. Take with or immediately following a meal. 180 tablet 2   Multiple Vitamin (MULTIVITAMIN) tablet Take 1 tablet by mouth daily.     nitroGLYCERIN (NITROSTAT) 0.4 MG SL tablet Place 1 tablet (0.4 mg total) under the tongue every 5 (five) minutes as needed for chest pain (x 3 doses). Reported on 05/08/2015 35 tablet 2   PERCOCET 10-325 MG tablet Take 1 tablet by mouth 5 (five) times daily as needed.     potassium chloride SA (KLOR-CON) 20 MEQ tablet Take 1 tablet (20 mEq total) by mouth 2 (two) times daily. 180 tablet 2   spironolactone (ALDACTONE) 25 MG tablet Take 1 tablet (25 mg total) by mouth daily. 90 tablet 0   telmisartan (MICARDIS) 40 MG  tablet TAKE ONE TABLET BY MOUTH ONCE DAILY 90 tablet 1   thiamine 100 MG tablet Take 1 tablet (100 mg total) by mouth daily. 90 tablet 1   warfarin (COUMADIN) 3 MG tablet TAKE ONE TABLET BY MOUTH AS DIRECTED by clinic 60 tablet 2   ondansetron (ZOFRAN-ODT) 4 MG disintegrating tablet Take 1 tablet (4 mg total) by mouth every 8 (eight) hours as needed for nausea or vomiting. 60 tablet 2   Semaglutide, 1 MG/DOSE, (OZEMPIC, 1 MG/DOSE,) 2 MG/1.5ML SOPN Inject 2 mg into the skin once a week. 18 mL 1   triamcinolone cream (KENALOG) 0.5 % Apply 1 application topically 3 (three) times daily. 60 g 3   No facility-administered medications prior to visit.     ROS Review of Systems  Constitutional:  Negative for diaphoresis and fatigue.  HENT: Negative.    Eyes:  Negative for visual disturbance.  Respiratory:  Negative for cough, chest tightness, shortness of breath and wheezing.   Cardiovascular:  Negative for chest pain, palpitations and leg swelling.  Gastrointestinal:  Negative for abdominal pain, blood in stool, constipation, diarrhea, nausea and vomiting.  Endocrine: Negative.  Negative for polydipsia, polyphagia and polyuria.  Genitourinary: Negative.  Negative for difficulty urinating.  Musculoskeletal:  Positive for arthralgias. Negative for back pain and myalgias.  Skin: Negative.   Neurological: Negative.  Negative for dizziness, weakness, light-headedness, numbness and headaches.  Hematological:  Negative for adenopathy. Does not bruise/bleed easily.  Psychiatric/Behavioral: Negative.     Objective:  BP 132/74 (BP Location: Left Arm, Patient Position: Sitting, Cuff Size: Large)   Pulse 86   Temp 98.1 F (36.7 C) (Oral)   Ht 5\' 1"  (1.549 m)   Wt 201 lb (91.2 kg)   SpO2 96%   BMI 37.98 kg/m   BP Readings from Last 3 Encounters:  09/21/20 132/74  05/13/20 110/72  03/26/20 138/70    Wt Readings from Last 3 Encounters:  09/21/20 201 lb (91.2 kg)  05/13/20 208 lb 6.4 oz (94.5 kg)  03/26/20 212 lb (96.2 kg)    Physical Exam Vitals reviewed.  Constitutional:      Appearance: She is obese. She is not ill-appearing.  HENT:     Nose: Nose normal.     Mouth/Throat:     Mouth: Mucous membranes are moist.  Eyes:     General: No scleral icterus.    Conjunctiva/sclera: Conjunctivae normal.  Cardiovascular:     Rate and Rhythm: Normal rate and regular rhythm.     Heart sounds: No murmur heard. Pulmonary:     Effort: Pulmonary effort is normal.     Breath sounds: No stridor. No wheezing, rhonchi or rales.  Abdominal:     General: Abdomen is protuberant. Bowel sounds are normal. There is no distension.      Palpations: Abdomen is soft. There is no hepatomegaly, splenomegaly or mass.     Tenderness: There is no abdominal tenderness.  Musculoskeletal:        General: Normal range of motion.     Cervical back: Neck supple.     Right lower leg: No edema.     Left lower leg: No edema.  Lymphadenopathy:     Cervical: No cervical adenopathy.  Skin:    General: Skin is warm and dry.  Neurological:     General: No focal deficit present.     Mental Status: She is alert.  Psychiatric:        Mood and Affect: Mood normal.  Behavior: Behavior normal.    Lab Results  Component Value Date   WBC 6.0 09/21/2020   HGB 12.6 09/21/2020   HCT 39.4 09/21/2020   PLT 238.0 09/21/2020   GLUCOSE 131 (H) 09/21/2020   CHOL 98 09/21/2020   TRIG 126.0 09/21/2020   HDL 34.60 (L) 09/21/2020   LDLDIRECT 116.0 11/28/2008   LDLCALC 39 09/21/2020   ALT 14 09/21/2020   AST 18 09/21/2020   NA 137 09/21/2020   K 4.3 09/21/2020   CL 103 09/21/2020   CREATININE 0.83 09/21/2020   BUN 19 09/21/2020   CO2 22 09/21/2020   TSH 2.26 09/21/2020   INR 3.0 08/25/2020   HGBA1C 8.6 (H) 09/21/2020   MICROALBUR <0.7 09/21/2020    MM 3D SCREEN BREAST BILATERAL  Result Date: 12/27/2019 CLINICAL DATA:  Screening. EXAM: DIGITAL SCREENING BILATERAL MAMMOGRAM WITH TOMO AND CAD COMPARISON:  Previous exam(s). ACR Breast Density Category b: There are scattered areas of fibroglandular density. FINDINGS: There are no findings suspicious for malignancy. Images were processed with CAD. IMPRESSION: No mammographic evidence of malignancy. A result letter of this screening mammogram will be mailed directly to the patient. RECOMMENDATION: Screening mammogram in one year. (Code:SM-B-01Y) BI-RADS CATEGORY  1: Negative. Electronically Signed   By: Nolon Nations M.D.   On: 12/27/2019 13:16    Assessment & Plan:   Kristen [name] was seen today for annual exam, atrial fibrillation, hypertension, hyperlipidemia and diabetes.  Diagnoses  and all orders for this visit:  DM (diabetes mellitus), type 2 with peripheral vascular complications ([place])- Her A1c is at 8.6%.  Her blood sugars are not adequately well controlled.  Will increase the dose of semaglutide. -     Basic metabolic panel; Future -     Hemoglobin A1c; Future -     Microalbumin / creatinine urine ratio; Future -     HM Diabetes Foot Exam -     Microalbumin / creatinine urine ratio -     Hemoglobin A1c -     Basic metabolic panel -     Discontinue: Semaglutide, 2 MG/DOSE, (OZEMPIC, 2 MG/DOSE,) 8 MG/3ML SOPN; Inject 2 mg into the skin once a week. -     Semaglutide, 2 MG/DOSE, (OZEMPIC, 2 MG/DOSE,) 8 MG/3ML SOPN; Inject 2 mg into the skin once a week. -     Continuous Blood Gluc Sensor (FREESTYLE LIBRE 2 SENSOR) MISC; 1 Act by Does not apply route daily. -     Continuous Blood Gluc Receiver (FREESTYLE LIBRE 2 READER) DEVI; 1 Act by Does not apply route daily.  Acquired hypothyroidism- Her TSH is in the normal range.  She will remain on the current dose of levothyroxine. -     TSH; Future -     TSH  Routine general medical examination at a health care facility-exam completed, labs reviewed, vaccines reviewed and updated, cancer screenings are up-to-date, patient education was given.  Thiamine deficiency -     CBC with Differential/Platelet; Future -     CBC with Differential/Platelet  Vitamin B12 deficiency anemia due to intrinsic factor deficiency- Her H&H and folate levels are normal. -     CBC with Differential/Platelet; Future -     Folate; Future -     Folate -     CBC with Differential/Platelet -     cyanocobalamin ((VITAMIN B-12)) injection 1,000 mcg  Need for shingles vaccine -     Zoster Vaccine Adjuvanted [facility]) injection; Inject 0.5 mLs into the muscle once for 1  dose.  PAF (paroxysmal atrial fibrillation) ([place])- She is maintaining sinus rhythm.  Hyperlipidemia associated with type 2 diabetes mellitus ([place])- LDL goal achieved. Doing well on  the statin  -     Lipid panel; Future -     Hepatic function panel; Future -     Hepatic function panel -     Lipid panel  Hypertension associated with diabetes ([place])- Her BP is adequately well controlled. -     Basic metabolic panel; Future -     Urinalysis, Routine w reflex microscopic; Future -     Urinalysis, Routine w reflex microscopic -     Basic metabolic panel  I have discontinued Kristen [name] "Pat"'s triamcinolone cream, ondansetron, and Ozempic (1 MG/DOSE). I am also having her start on Shingrix, FreeStyle [employer], and [employer] Reader. Additionally, I am having her maintain her ONE TOUCH ULTRA 2, Percocet, multivitamin, vitamin C, Vitamin D3, thiamine, NovoFine, onetouch ultrasoft, Cool Blood Glucose Test Strips, Toujeo Max SoloStar, cyclobenzaprine, nitroGLYCERIN, metoprolol succinate, potassium chloride SA, furosemide, metFORMIN, atorvastatin, magnesium oxide, telmisartan, warfarin, spironolactone, empagliflozin, ALPRAZolam, gabapentin, and Ozempic (2 MG/DOSE). We administered cyanocobalamin.  Meds ordered this encounter  Medications   Zoster Vaccine Adjuvanted [facility]) injection    Sig: Inject 0.5 mLs into the muscle once for 1 dose.    Dispense:  0.5 mL    Refill:  1   cyanocobalamin ((VITAMIN B-12)) injection 1,000 mcg   DISCONTD: Semaglutide, 2 MG/DOSE, (OZEMPIC, 2 MG/DOSE,) 8 MG/3ML SOPN    Sig: Inject 2 mg into the skin once a week.    Dispense:  9 mL    Refill:  1   Semaglutide, 2 MG/DOSE, (OZEMPIC, 2 MG/DOSE,) 8 MG/3ML SOPN    Sig: Inject 2 mg into the skin once a week.    Dispense:  9 mL    Refill:  1   Continuous Blood Gluc Sensor (FREESTYLE LIBRE 2 SENSOR) MISC    Sig: 1 Act by Does not apply route daily.    Dispense:  2 each    Refill:  5   Continuous Blood Gluc Receiver (FREESTYLE LIBRE 2 READER) DEVI    Sig: 1 Act by Does not apply route daily.    Dispense:  2 each    Refill:  5     Follow-up: Return in about 6 months  (around 03/24/2021).  Scarlette Calico, MD

## 2020-09-22 ENCOUNTER — Ambulatory Visit: Payer: Medicare Other | Admitting: *Deleted

## 2020-09-22 ENCOUNTER — Ambulatory Visit: Payer: Medicare Other

## 2020-09-22 DIAGNOSIS — F32A Depression, unspecified: Secondary | ICD-10-CM

## 2020-09-22 DIAGNOSIS — F419 Anxiety disorder, unspecified: Secondary | ICD-10-CM

## 2020-09-22 DIAGNOSIS — M797 Fibromyalgia: Secondary | ICD-10-CM

## 2020-09-22 DIAGNOSIS — M150 Primary generalized (osteo)arthritis: Secondary | ICD-10-CM | POA: Diagnosis not present

## 2020-09-22 DIAGNOSIS — E1151 Type 2 diabetes mellitus with diabetic peripheral angiopathy without gangrene: Secondary | ICD-10-CM

## 2020-09-22 DIAGNOSIS — M47816 Spondylosis without myelopathy or radiculopathy, lumbar region: Secondary | ICD-10-CM | POA: Diagnosis not present

## 2020-09-22 DIAGNOSIS — G4701 Insomnia due to medical condition: Secondary | ICD-10-CM

## 2020-09-22 DIAGNOSIS — Z79891 Long term (current) use of opiate analgesic: Secondary | ICD-10-CM | POA: Diagnosis not present

## 2020-09-22 DIAGNOSIS — G894 Chronic pain syndrome: Secondary | ICD-10-CM | POA: Diagnosis not present

## 2020-09-22 MED ORDER — FREESTYLE LIBRE 2 SENSOR MISC: 1.0000 | Freq: Every day | 5 refills | Status: DC

## 2020-09-22 MED ORDER — FREESTYLE LIBRE 2 READER DEVI: 1.0000 | Freq: Every day | 5 refills | Status: DC

## 2020-09-22 NOTE — Chronic Care Management (AMB) (Signed)
Chronic Care Management    Clinical Social Work Note  09/22/2020 Name: Kristen [name] MRN: [id number] DOB: 1945/08/04  Kristen [name] is a 75 y.o. year old female who is a primary care patient of [name], MD. The CCM team was consulted to assist the patient with chronic disease management and/or care coordination needs related to: Mental Health Counseling and Resources.   Engaged with patient by telephone for follow up visit in response to provider referral for social work chronic care management and care coordination services.   Consent to Services:  The patient was given information about Chronic Care Management services, agreed to services, and gave verbal consent prior to initiation of services.  Please see initial visit note for detailed documentation.   Patient agreed to services and consent obtained.   Assessment: Review of patient past medical history, allergies, medications, and health status, including review of relevant consultants reports was performed today as part of a comprehensive evaluation and provision of chronic care management and care coordination services.     SDOH (Social Determinants of Health) assessments and interventions performed:    Advanced Directives Status: Not addressed in this encounter.  CCM Care Plan  Allergies  Allergen Reactions   Metformin And Related Other (See Comments)    Must take XR form only, cannot tolerate Regular release metformin   Cleocin [Clindamycin Hcl] Diarrhea   Codeine Itching   Doxycycline Hyclate Diarrhea   Macrolides And Ketolides Diarrhea   Morphine Hives   Pentazocine Lactate Itching and Nausea And Vomiting    (GENERIC- [place])   Vibramycin [Doxycycline Calcium] Diarrhea and Rash   Clindamycin Diarrhea   Definity [Perflutren Lipid Microsphere] Other (See Comments)    Patient complained of warm sensation in chest x few seconds duration when injected Definity.No other symptoms   Doxycycline Diarrhea    Metformin Other (See Comments)   Pentazocine Nausea Only   Perflutren Other (See Comments)   Perflutren Lipid Microspheres Other (See Comments)   Sulfa Antibiotics Diarrhea and Rash   Sulfonamide Derivatives Rash    Outpatient Encounter Medications as of 09/22/2020  Medication Sig   ALPRAZolam (XANAX) 1 MG tablet Take 1 tablet (1 mg total) by mouth 3 (three) times daily as needed for anxiety.   Ascorbic Acid (VITAMIN C) 1000 MG tablet Take 1,000 mg by mouth daily.   atorvastatin (LIPITOR) 40 MG tablet TAKE ONE TABLET BY MOUTH ONCE DAILY   Blood Glucose Monitoring Suppl (ONE TOUCH ULTRA 2) w/Device KIT Use as advised   Cholecalciferol (VITAMIN D3) 50 MCG (2000 UT) capsule Take 2,000 Units by mouth daily.   Continuous Blood Gluc Receiver (FREESTYLE LIBRE 2 READER) DEVI 1 Act by Does not apply route daily.   Continuous Blood Gluc Sensor (FREESTYLE LIBRE 2 SENSOR) MISC 1 Act by Does not apply route daily.   cyclobenzaprine (FLEXERIL) 10 MG tablet Take 10 mg by mouth 3 (three) times daily as needed for muscle spasms.   empagliflozin (JARDIANCE) 25 MG TABS tablet Take 1 tablet (25 mg total) by mouth daily. Via BI CARES pt assistance   furosemide (LASIX) 20 MG tablet Take 1 tablet (20 mg total) by mouth every morning.   gabapentin (NEURONTIN) 300 MG capsule Take 1 capsule (300 mg total) by mouth 3 (three) times daily.   glucose blood (COOL BLOOD GLUCOSE TEST STRIPS) test strip Use to test blood sugar twice daily. DX: E11.9   insulin glargine, 2 Unit Dial, (TOUJEO MAX SOLOSTAR) 300 UNIT/ML Solostar Pen Inject 50  Units into the skin daily.   Insulin Pen Needle (NOVOFINE) 32G X 6 MM MISC 1 Act by Does not apply route daily.   Lancets (ONETOUCH ULTRASOFT) lancets Use as instructed   magnesium oxide (MAG-OX) 400 MG tablet Take 1 tablet (400 mg total) by mouth 2 (two) times daily. WITH BREAKFAST AND EVENING MEALS.   metFORMIN (GLUCOPHAGE-XR) 500 MG 24 hr tablet TAKE TWO TABLETS BY MOUTH TWICE DAILY  WITH FOOD   metoprolol succinate (TOPROL-XL) 100 MG 24 hr tablet Take 1 tablet (100 mg total) by mouth 2 (two) times daily. Take with or immediately following a meal.   Multiple Vitamin (MULTIVITAMIN) tablet Take 1 tablet by mouth daily.   nitroGLYCERIN (NITROSTAT) 0.4 MG SL tablet Place 1 tablet (0.4 mg total) under the tongue every 5 (five) minutes as needed for chest pain (x 3 doses). Reported on 05/08/2015   PERCOCET 10-325 MG tablet Take 1 tablet by mouth 5 (five) times daily as needed.   potassium chloride SA (KLOR-CON) 20 MEQ tablet Take 1 tablet (20 mEq total) by mouth 2 (two) times daily.   Semaglutide, 2 MG/DOSE, (OZEMPIC, 2 MG/DOSE,) 8 MG/3ML SOPN Inject 2 mg into the skin once a week.   spironolactone (ALDACTONE) 25 MG tablet Take 1 tablet (25 mg total) by mouth daily.   telmisartan (MICARDIS) 40 MG tablet TAKE ONE TABLET BY MOUTH ONCE DAILY   thiamine 100 MG tablet Take 1 tablet (100 mg total) by mouth daily.   warfarin (COUMADIN) 3 MG tablet TAKE ONE TABLET BY MOUTH AS DIRECTED by clinic   No facility-administered encounter medications on file as of 09/22/2020.    Patient Active Problem List   Diagnosis Date Noted   PAF (paroxysmal atrial fibrillation) (HCC)    Poor dentition requiring referral to dentistry 03/26/2020   Thiamine deficiency 12/30/2018   Vitamin B12 deficiency anemia due to intrinsic factor deficiency 12/20/2018   CHF (congestive heart failure), NYHA class I, chronic, combined (HCC) 12/19/2018   Coronary artery disease involving native coronary artery of native heart with angina pectoris (HCC) 12/19/2018   Obesity with body mass index (BMI) of 30.0 to 39.9 01/15/2018   Chronic idiopathic constipation 12/27/2017   Type 2 diabetes mellitus (HCC) 10/30/2017   Eczema 08/07/2017   Insomnia secondary to chronic pain 08/04/2016   Visit for screening mammogram 04/14/2016   LV dysfunction    Routine general medical examination at a health care facility 05/03/2013   DM  (diabetes mellitus), type 2 with peripheral vascular complications (HCC) 12/01/2012   PVC's/nonsustained VT 10/24/2012   H/O: CVA (cerebrovascular accident) 02/09/2012   Hypothyroidism 05/27/2011   Fibromyalgia 10/08/2010   Long term current use of anticoagulant 05/05/2010   Low back pain, non-specific 05/12/2008   Cognitive dysfunction associated with depression 01/29/2007   Osteoarthritis 11/22/2006   Hyperlipidemia associated with type 2 diabetes mellitus (HCC) 09/13/2006   Coronary atherosclerosis 09/13/2006   GERD 09/13/2006   Hypertension associated with diabetes (HCC) 08/30/2006    Conditions to be addressed/monitored: Anxiety; Mental Health Concerns   Care Plan : LCSW Plan of Care  Updates made by Buck Mam, LCSW since 09/22/2020 12:00 AM     Problem: Lacks strategy for management of anxiety around sleeping and fear of dying.   Priority: High     Long-Range Goal: Establish and Maintain plan for anxiety management   Start Date: 04/09/2020  Expected End Date: 11/03/2020  This Visit's Progress: On track  Recent Progress: On track  Priority: High  Note:   Current Barriers:  Pt continues to have worry/fear and anxiety. Reports "afraid to go to sleep and die".  CSW encouraged pt to consider counseling support and she is now agreeable to referral being made- she will check with her insurance for copay/cost.  Pt acknowledges grief from the loss of her grandson- appears to be coping in a normal/healthy manner Chronic Mental Health needs related to anxiety/PTSD - has participated in an initial visit with [facility] clinician and was not set up for follow up. Pt has decided to pause and does not plan on re-scheduling and/or connecting with new mental health therapist at this time. Limited social support, Mental Health Concerns , and Family and relationship dysfunction Suicidal Ideation/Homicidal Ideation: No Pt shares she is a bit overwhelmed by the pile of mail she has to go through  and concerns for disposing of the personal documents-she continues to work on preparing them for shredding/trash Clinical Social Work Goal(s):  Over the next 30 days pt will begin to go through [employer] of mail and organize a pile of papers that need to be shredded for privacy/confidentiality and those that can just be recycled or trashed.  Pt will continue to focus on positive self talk and thought patterns, not over-reacting, and seeking ways to better cope with uncomfortable feelings as they arise Over the next 90 days, patient will work with SW monthly by telephone to reduce or manage symptoms related to anxiety Interventions: Pt recently shared with CSW that the family had an unexpected death and she and her husband traveled by train to [place]- she has returned and has some physical ailments from the train ride and voices continued frustration with things in her life.  She is now agreeing to referral being made for counseling support.  Solution-Focused Strategies, Mindfulness or Psychologist, educational, Active listening / Reflection utilized , Emotional Supportive Provided, Problem Solving /Task Center , Psychoeducation for mental health needs , Motivational Interviewing, and Brief CBT  Patient Coping Strengths:  Spirituality Hopefulness Self Advocate Able to Communicate Effectively [place] and trust with her Psychiatrist and PCP Patient Self Care Deficits:  Lacks social connections Patient Goals:  -expect phonecall from [place] to schedule counseling visit - Continue with visits with  Dr Casimiro Needle and further discuss with him about resuming therapy if you feel needed. - practice relaxation or meditation  -continue with positive self talk, "don't sweat the small stuff", and allowing yourself to be more aware of your emotions and actions that come - reach out to CSW and/or Dr Casimiro Needle if needs arise and want to pursue counseling appointment before scheduled visit with Dr Casimiro Needle  Follow Up  Plan: Appointment scheduled for SW follow up with client by phone on:  10/23/2020       Follow Up Plan:  Referral made today to [facility] for followup counseling support. CSW will call pt 10/23/20      Eduard Clos MSW, LCSW Licensed Clinical Social Worker [place] [phone]

## 2020-09-22 NOTE — Patient Instructions (Signed)
Visit Information  PATIENT GOALS:  Goals Addressed             This Visit's Progress    Work on managing my worry and fear       Timeframe:  Long-Range Goal Priority:  High Start Date:    04/09/2020                         Expected End Date:  11/03/2020              Follow Up Date 10/23/20   --expect phonecall from [facility] to schedule counseling visit - Continue with visits with  Dr Casimiro Needle and further discuss with him about resuming therapy if you feel needed. - practice relaxation or meditation  -continue with positive self talk, "don't sweat the small stuff", and allowing yourself to be more aware of your emotions and actions that come - reach out to CSW and/or Dr Casimiro Needle if needs arise and want to pursue counseling appointment before scheduled visit with Dr Casimiro Needle      Why is this important?   When you are stressed, down or upset, your body reacts too.  For example, your blood pressure may get higher; you may have a headache or stomachache.  When your emotions get the best of you, your body's ability to fight off cold and flu gets weak.  These steps will help you manage your emotions.     Notes:         The patient verbalized understanding of instructions, educational materials, and care plan provided today and declined offer to receive copy of patient instructions, educational materials, and care plan.   Telephone follow up appointment with care management team member scheduled for:10/23/20  Azya Barbero MSW, LCSW Licensed Clinical Social Worker [place] [phone]

## 2020-09-23 ENCOUNTER — Encounter: Payer: Self-pay | Admitting: Internal Medicine

## 2020-09-25 ENCOUNTER — Telehealth: Payer: Self-pay | Admitting: Internal Medicine

## 2020-09-25 ENCOUNTER — Telehealth: Payer: Self-pay

## 2020-09-25 DIAGNOSIS — E1151 Type 2 diabetes mellitus with diabetic peripheral angiopathy without gangrene: Secondary | ICD-10-CM

## 2020-09-25 NOTE — Telephone Encounter (Signed)
   Cover [employer] calling for prior auth for  Semaglutide, 2 MG/DOSE, (OZEMPIC, 2 MG/DOSE,) 8 MG/3ML SOPN  Phone [phone] Ref key #[id number]

## 2020-09-25 NOTE — Telephone Encounter (Signed)
pt is requesting the One Touch Ultra Blood Glucose Monitor kit with supplies as she can not afford the [facility] and does not want this device. Please send into pharmacy on file asap!  **Sent to MA

## 2020-09-25 NOTE — Telephone Encounter (Signed)
PA has been sent to plan. 

## 2020-09-28 ENCOUNTER — Other Ambulatory Visit: Payer: Self-pay | Admitting: Internal Medicine

## 2020-09-28 DIAGNOSIS — E1151 Type 2 diabetes mellitus with diabetic peripheral angiopathy without gangrene: Secondary | ICD-10-CM

## 2020-09-28 MED ORDER — COOL BLOOD GLUCOSE TEST STRIPS VI STRP: ORAL_STRIP | 1 refills | Status: DC

## 2020-09-28 MED ORDER — ONETOUCH ULTRA 2 W/DEVICE KIT: PACK | 2 refills | Status: DC

## 2020-09-28 NOTE — Addendum Note (Signed)
Addended by: Charlton Haws on: 09/28/2020 04:55 PM   Modules accepted: Orders

## 2020-09-29 ENCOUNTER — Other Ambulatory Visit: Payer: Self-pay

## 2020-09-29 ENCOUNTER — Ambulatory Visit (INDEPENDENT_AMBULATORY_CARE_PROVIDER_SITE_OTHER): Payer: Medicare Other | Admitting: General Practice

## 2020-09-29 DIAGNOSIS — Z7901 Long term (current) use of anticoagulants: Secondary | ICD-10-CM | POA: Diagnosis not present

## 2020-09-29 LAB — POCT INR: INR: 2.6 (ref 2.0–3.0)

## 2020-09-29 NOTE — Patient Instructions (Addendum)
Pre visit review using our clinic review tool, if applicable. No additional management support is needed unless otherwise documented below in the visit note.  Continue to take 1 tablet daily except 2 tablets on Mon and 1 1/2 on Fridays. Re-check in 6 weeks.

## 2020-09-30 NOTE — Progress Notes (Signed)
I have reviewed and agree.

## 2020-10-02 ENCOUNTER — Telehealth: Payer: Self-pay

## 2020-10-02 NOTE — Telephone Encounter (Signed)
There is no need to reschedule - not sure where this phone call came from. Please let her know I am planning to call her Monday as scheduled.

## 2020-10-02 NOTE — Telephone Encounter (Signed)
pt has stated she does not want to r/s her apptmnt for Monday with you as she has gotten a phne call stating she had to r/s. Pt states she needs to speak urgently about her meds.

## 2020-10-05 ENCOUNTER — Ambulatory Visit (INDEPENDENT_AMBULATORY_CARE_PROVIDER_SITE_OTHER): Payer: Medicare Other | Admitting: Pharmacist

## 2020-10-05 ENCOUNTER — Other Ambulatory Visit: Payer: Self-pay

## 2020-10-05 DIAGNOSIS — E1169 Type 2 diabetes mellitus with other specified complication: Secondary | ICD-10-CM

## 2020-10-05 DIAGNOSIS — I5042 Chronic combined systolic (congestive) and diastolic (congestive) heart failure: Secondary | ICD-10-CM

## 2020-10-05 DIAGNOSIS — E1149 Type 2 diabetes mellitus with other diabetic neurological complication: Secondary | ICD-10-CM

## 2020-10-05 DIAGNOSIS — E785 Hyperlipidemia, unspecified: Secondary | ICD-10-CM

## 2020-10-05 DIAGNOSIS — Z794 Long term (current) use of insulin: Secondary | ICD-10-CM

## 2020-10-05 DIAGNOSIS — I480 Paroxysmal atrial fibrillation: Secondary | ICD-10-CM

## 2020-10-05 DIAGNOSIS — I25119 Atherosclerotic heart disease of native coronary artery with unspecified angina pectoris: Secondary | ICD-10-CM

## 2020-10-05 NOTE — Progress Notes (Signed)
Chronic Care Management Pharmacy Note  10/06/2020 Name:  Kristen [name] MRN:  [id number] DOB:  [birth year]  Summary: -Pt started Ozempic 2 mg 2 weeks ago -Pt reports dental issues make it difficult to chew solid food, so she eats a lot of soft/pureed food that tends to be higher in carbs. She reports dental work to fix the issue is too expensive   Recommendations/Changes made from today's visit: -Advised pt to discuss financial issues with CCM SW and/or Care guide -Counseled on carb counting - advised <50 g carb per meal and <15g per snack   Subjective: Kristen [name] is an 75 y.o. year old female who is a primary patient of [name], MD.  The CCM team was consulted for assistance with disease management and care coordination needs.    Engaged with patient by telephone for follow up visit in response to provider referral for pharmacy case management and/or care coordination services.   Consent to Services:  The patient was given information about Chronic Care Management services, agreed to services, and gave verbal consent prior to initiation of services.  Please see initial visit note for detailed documentation.   Patient Care Team: [name], MD as PCP - General (Internal Medicine) Sherren Mocha, MD as PCP - Cardiology (Cardiology) Deboraha Sprang, MD as Consulting Physician (Cardiology) Charlton Haws, [facility] as Pharmacist (Pharmacist) Shon Hough, MD as Consulting Physician (Ophthalmology) Deirdre Peer, LCSW as Social Worker (Licensed Clinical Social Worker) Knox Royalty, RN as Case Manager  Recent office visits: 09/21/20 Dr Ronnald Ramp OV: chronic f/u; A1c 8.6. Increase Ozempic to 2 mg.  Recent consult visits: 08/18/20 Dr Casimiro Needle VV: no changes  Hospital visits: None in previous 6 months   Objective:  Lab Results  Component Value Date   CREATININE 0.83 09/21/2020   BUN 19 09/21/2020   GFR 70.27 09/21/2020   GFRNONAA 64  10/16/2019   GFRAA 75 10/16/2019   NA 137 09/21/2020   K 4.3 09/21/2020   CALCIUM 10.1 09/21/2020   CO2 22 09/21/2020   GLUCOSE 131 (H) 09/21/2020    Lab Results  Component Value Date/Time   HGBA1C 8.6 (H) 09/21/2020 02:25 PM   HGBA1C 8.0 (A) 03/26/2020 02:32 PM   HGBA1C 8.3 (H) 10/10/2019 03:46 PM   GFR 70.27 09/21/2020 02:25 PM   GFR 78.64 04/11/2019 02:55 PM   MICROALBUR <0.7 09/21/2020 02:25 PM   MICROALBUR <0.7 04/11/2019 02:55 PM    Last diabetic Eye exam:  Lab Results  Component Value Date/Time   HMDIABEYEEXA No Retinopathy 03/21/2019 12:00 AM    Last diabetic Foot exam:  Lab Results  Component Value Date/Time   HMDIABFOOTEX normal 04/02/2014 12:00 AM     Lab Results  Component Value Date   CHOL 98 09/21/2020   HDL 34.60 (L) 09/21/2020   LDLCALC 39 09/21/2020   LDLDIRECT 116.0 11/28/2008   TRIG 126.0 09/21/2020   CHOLHDL 3 09/21/2020    Hepatic Function Latest Ref Rng & Units 09/21/2020 04/11/2019 12/19/2018  Total Protein 6.0 - 8.3 g/dL 8.0 8.0 7.8  Albumin 3.5 - 5.2 g/dL 4.5 4.4 4.9  AST 0 - 37 U/L $Remo'18 12 15  'yRGfD$ ALT 0 - 35 U/L $Remo'14 13 12  'VeSeW$ Alk Phosphatase 39 - 117 U/L 50 53 47  Total Bilirubin 0.2 - 1.2 mg/dL 0.5 0.4 1.5(H)  Bilirubin, Direct 0.0 - 0.3 mg/dL 0.1 0.1 0.3    Lab Results  Component Value Date/Time   TSH 2.26 09/21/2020 02:25  PM   TSH 1.55 10/10/2019 03:46 PM   FREET4 0.73 09/27/2013 01:28 PM   FREET4 0.68 05/07/2012 09:44 AM    CBC Latest Ref Rng & Units 09/21/2020 10/10/2019 04/11/2019  WBC 4.0 - 10.5 K/uL 6.0 6.0 6.5  Hemoglobin 12.0 - 15.0 g/dL 12.6 12.2 13.0  Hematocrit 36.0 - 46.0 % 39.4 39.8 41.3  Platelets 150.0 - 400.0 K/uL 238.0 308 398.0    No results found for: VD25OH  Clinical ASCVD: Yes  The ASCVD Risk score Mikey Bussing DC Jr., et al., 2013) failed to calculate for the following reasons:   The valid total cholesterol range is 130 to 320 mg/dL    Depression screen [facility] 2/9 06/03/2020 04/09/2020 02/26/2020  Decreased Interest 2 1 0   Down, Depressed, Hopeless 0 1 0  PHQ - 2 Score 2 2 0  Altered sleeping 2 2 -  Tired, decreased energy - 1 -  Change in appetite 1 1 -  Feeling bad or failure about yourself  0 1 -  Trouble concentrating 1 1 -  Moving slowly or fidgety/restless 1 1 -  Suicidal thoughts 0 0 -  PHQ-9 Score 7 9 -  Difficult doing work/chores Not difficult at all Somewhat difficult -  Some recent data might be hidden     Social History   Tobacco Use  Smoking Status Former   Types: Cigarettes   Quit date: 03/07/2001   Years since quitting: 19.5  Smokeless Tobacco Never   BP Readings from Last 3 Encounters:  09/21/20 132/74  05/13/20 110/72  03/26/20 138/70   Pulse Readings from Last 3 Encounters:  09/21/20 86  05/13/20 96  03/26/20 98   Wt Readings from Last 3 Encounters:  09/21/20 201 lb (91.2 kg)  05/13/20 208 lb 6.4 oz (94.5 kg)  03/26/20 212 lb (96.2 kg)   BMI Readings from Last 3 Encounters:  09/21/20 37.98 kg/m  05/13/20 39.38 kg/m  03/26/20 40.06 kg/m    Assessment/Interventions: Review of patient past medical history, allergies, medications, health status, including review of consultants reports, laboratory and other test data, was performed as part of comprehensive evaluation and provision of chronic care management services.   SDOH:  (Social Determinants of Health) assessments and interventions performed: Yes  SDOH Screenings   Alcohol Screen: Low Risk    Last Alcohol Screening Score (AUDIT): 0  Depression (PHQ2-9): Medium Risk   PHQ-2 Score: 7  Financial Resource Strain: High Risk   Difficulty of Paying Living Expenses: Hard  Food Insecurity: No Food Insecurity   Worried About Charity fundraiser in the Last Year: Never true   Ran Out of Food in the Last Year: Never true  Housing: Low Risk    Last Housing Risk Score: 0  Physical Activity: Inactive   Days of Exercise per Week: 0 days   Minutes of Exercise per Session: 0 min  Social Connections: Moderately  Isolated   Frequency of Communication with Friends and Family: More than three times a week   Frequency of Social Gatherings with Friends and Family: Never   Attends Religious Services: Never   Marine scientist or Organizations: No   Attends Archivist Meetings: Never   Marital Status: Married  Stress: Stress Concern Present   Feeling of Stress : To some extent  Tobacco Use: Medium Risk   Smoking Tobacco Use: Former   Smokeless Tobacco Use: Never  Transportation Needs: No Data processing manager (Medical): No   Lack  of Transportation (Non-Medical): No    CCM Care Plan  Allergies  Allergen Reactions   Metformin And Related Other (See Comments)    Must take XR form only, cannot tolerate Regular release metformin   Cleocin [Clindamycin Hcl] Diarrhea   Codeine Itching   Doxycycline Hyclate Diarrhea   Macrolides And Ketolides Diarrhea   Morphine Hives   Pentazocine Lactate Itching and Nausea And Vomiting    (GENERIC- [place])   Vibramycin [Doxycycline Calcium] Diarrhea and Rash   Clindamycin Diarrhea   Definity [Perflutren Lipid Microsphere] Other (See Comments)    Patient complained of warm sensation in chest x few seconds duration when injected Definity.No other symptoms   Doxycycline Diarrhea   Metformin Other (See Comments)   Pentazocine Nausea Only   Perflutren Other (See Comments)   Perflutren Lipid Microspheres Other (See Comments)   Sulfa Antibiotics Diarrhea and Rash   Sulfonamide Derivatives Rash    Medications Reviewed Today     Reviewed by Charlton Haws, [facility] (Pharmacist) on 10/06/20 at [place] List Status: <None>   Medication Order Taking? Sig Documenting Provider Last Dose Status Informant  ALPRAZolam (XANAX) 1 MG tablet [id number] Yes Take 1 tablet (1 mg total) by mouth 3 (three) times daily as needed for anxiety. Norma Fredrickson, MD Taking Active   Ascorbic Acid (VITAMIN C) 1000 MG tablet [id number] Yes Take 1,000  mg by mouth daily. [provider] Taking Active   atorvastatin (LIPITOR) 40 MG tablet [id number] Yes TAKE ONE TABLET BY MOUTH ONCE DAILY [name], MD Taking Active   Blood Glucose Monitoring Suppl (ONE TOUCH ULTRA 2) w/Device KIT [id number] Yes Use TID [name], MD Taking Active   Cholecalciferol (VITAMIN D3) 50 MCG (2000 UT) capsule [id number] Yes Take 2,000 Units by mouth daily. [provider] Taking Active   cyclobenzaprine (FLEXERIL) 10 MG tablet [id number] Yes Take 10 mg by mouth 3 (three) times daily as needed for muscle spasms. [provider] Taking Active Self  empagliflozin (JARDIANCE) 25 MG TABS tablet [id number] Yes Take 1 tablet (25 mg total) by mouth daily. Via BI CARES pt assistance [name], MD Taking Active   furosemide (LASIX) 20 MG tablet [id number] Yes Take 1 tablet (20 mg total) by mouth every morning. [name], MD Taking Active   gabapentin (NEURONTIN) 300 MG capsule [id number] Yes Take 1 capsule (300 mg total) by mouth 3 (three) times daily. [name], MD Taking Active   glucose blood (COOL BLOOD GLUCOSE TEST STRIPS) test strip [id number] Yes Use to test blood sugar TID. DX: E11.9 [name], MD Taking Active   insulin glargine, 2 Unit Dial, (TOUJEO MAX SOLOSTAR) 300 UNIT/ML Solostar Pen [id number] Yes Inject 50 Units into the skin daily. [name], MD Taking Active   Insulin Pen Needle (NOVOFINE) 32G X 6 MM MISC [id number] Yes 1 Act by Does not apply route daily. [name], MD Taking Active   Lancets [facility] ULTRASOFT) lancets [id number] Yes Use as instructed [name], MD Taking Active   magnesium oxide (MAG-OX) 400 MG tablet [id number] Yes Take 1 tablet (400 mg total) by mouth 2 (two) times daily. WITH BREAKFAST AND EVENING MEALS. Sherren Mocha, MD Taking Active   metFORMIN (GLUCOPHAGE-XR) 500 MG 24 hr tablet [id number] Yes TAKE TWO TABLETS BY MOUTH TWICE DAILY WITH FOOD [name], MD Taking  Active   metoprolol succinate (TOPROL-XL) 100 MG 24 hr tablet [id number] Yes Take 1 tablet (100 mg total)  by mouth 2 (two) times daily. Take with or immediately following a meal. Sherren Mocha, MD Taking Active   Multiple Vitamin (MULTIVITAMIN) tablet [id number] Yes Take 1 tablet by mouth daily. [provider] Taking Active   nitroGLYCERIN (NITROSTAT) 0.4 MG SL tablet [id number] Yes Place 1 tablet (0.4 mg total) under the tongue every 5 (five) minutes as needed for chest pain (x 3 doses). Reported on 05/08/2015 Sherren Mocha, MD Taking Active   PERCOCET 10-325 MG tablet [id number] Yes Take 1 tablet by mouth 5 (five) times daily as needed. [provider] Taking Active   potassium chloride SA (KLOR-CON) 20 MEQ tablet [id number] Yes Take 1 tablet (20 mEq total) by mouth 2 (two) times daily. Sherren Mocha, MD Taking Active   Semaglutide, 2 MG/DOSE, (OZEMPIC, 2 MG/DOSE,) 8 MG/3ML SOPN [id number] Yes Inject 2 mg into the skin once a week. [name], MD Taking Active   spironolactone (ALDACTONE) 25 MG tablet [id number] Yes Take 1 tablet (25 mg total) by mouth daily. [name], MD Taking Active   telmisartan (MICARDIS) 40 MG tablet [id number] Yes TAKE ONE TABLET BY MOUTH ONCE DAILY [name], MD Taking Active   thiamine 100 MG tablet [id number] Yes Take 1 tablet (100 mg total) by mouth daily. [name], MD Taking Active   warfarin (COUMADIN) 3 MG tablet [id number] Yes TAKE ONE TABLET BY MOUTH AS DIRECTED by clinic [name], MD Taking Active             Patient Active Problem List   Diagnosis Date Noted   PAF (paroxysmal atrial fibrillation) ([place])    Poor dentition requiring referral to dentistry 03/26/2020   Thiamine deficiency 12/30/2018   Vitamin B12 deficiency anemia due to intrinsic factor deficiency 12/20/2018   CHF (congestive heart failure), NYHA class I, chronic, combined ([place]) 12/19/2018   Coronary artery disease involving native coronary artery  of native heart with angina pectoris ([place]) 12/19/2018   Obesity with body mass index (BMI) of 30.0 to 39.9 01/15/2018   Chronic idiopathic constipation 12/27/2017   Type 2 diabetes mellitus ([place]) 10/30/2017   Eczema 08/07/2017   Insomnia secondary to chronic pain 08/04/2016   Visit for screening mammogram 04/14/2016   LV dysfunction    Routine general medical examination at a health care facility 05/03/2013   DM (diabetes mellitus), type 2 with peripheral vascular complications ([place]) 54/65/6812   PVC's/nonsustained VT 10/24/2012   H/O: CVA (cerebrovascular accident) 02/09/2012   Hypothyroidism 05/27/2011   Fibromyalgia 10/08/2010   Long term current use of anticoagulant 05/05/2010   Low back pain, non-specific 05/12/2008   Cognitive dysfunction associated with depression 01/29/2007   Osteoarthritis 11/22/2006   Hyperlipidemia associated with type 2 diabetes mellitus ([place]) 09/13/2006   Coronary atherosclerosis 09/13/2006   GERD 09/13/2006   Hypertension associated with diabetes ([place]) 08/30/2006    Immunization History  Administered Date(s) Administered   Fluad Quad(high Dose 65+) 11/16/2018, 12/10/2019   Influenza Split 12/14/2010, 12/12/2011   Influenza Whole 01/05/2006, 01/29/2007, 12/26/2007, 11/28/2008, 12/04/2009   Influenza, High Dose Seasonal PF 11/12/2014, 01/26/2016, 11/25/2016, 12/27/2017   Influenza,inj,Quad PF,6+ Mos 11/12/2012, 04/08/2014   Moderna Sars-Covid-2 Vaccination 04/20/2019, 06/03/2019   Pneumococcal Conjugate-13 08/09/2013   Pneumococcal Polysaccharide-23 12/04/2009, 12/14/2010, 10/12/2016   Td 03/07/2005   Tdap 04/14/2016    Conditions to be addressed/monitored:  Hypertension, Hyperlipidemia, Diabetes, Atrial Fibrillation, Heart Failure, and Coronary Artery Disease  Care Plan : [place]  Updates made by Charlton Haws, [place] since  10/06/2020 12:00 AM     Problem: DM, HTN, HLD, CAD, CHF   Priority: High     Long-Range Goal:  Disease management   Start Date: 04/07/2020  Expected End Date: 04/07/2021  This Visit's Progress: On track  Recent Progress: On track  Priority: High  Note:   Current Barriers:  Unable to independently afford treatment regimen Unable to independently monitor therapeutic efficacy Unable to achieve control of Diabetes   Pharmacist Clinical Goal(s):  Patient will verbalize ability to afford treatment regimen achieve adherence to monitoring guidelines and medication adherence to achieve therapeutic efficacy achieve control of diabetes as evidenced by improvement in A1c through collaboration with PharmD and provider.   Interventions: 1:1 collaboration with [name], MD regarding development and update of comprehensive plan of care as evidenced by provider attestation and co-signature Inter-disciplinary care team collaboration (see longitudinal plan of care) Comprehensive medication review performed; medication list updated in electronic medical record  Hypertension / Heart Failure (BP goal <130/80) -Controlled - BP is at goal; pt endorses compliance and denies issues -Current home readings: 117/76 -HF Type: Combined Systolic and Diastolic -NYHA Class: II (slight limitation of activity) -Ejection fraction: 50-55% (Date: 09/02/2014) -Current treatment: Metoprolol succinate 100 mg twice daily  Telmisartan 40 mg daily Spironolactone 25 mg daily Furosemide 20 mg daily Potassium chloride 20 meq twice daily Nitroglycerin 0.4 mg as needed -Denies hypotensive/hypertensive symptoms -Counseled to monitor BP at home twice a week, document, and provide log at future appointments -Recommend to continue current medication  Hyperlipidemia: (LDL goal < 70) -Controlled - LDL is at goal; pt endorses compliance and denies side effects -Current treatment: Atorvastatin 40 mg daily -Educated on Cholesterol goals -Recommended to continue current medication  Diabetes (A1c goal <8%) -Uncontrolled  - pt reports she has dental issues and it hard for her to chew solid food; she eats a lot of mashed potatoes, soft foods that are higher in carbs because of this; she reports she cannot afford the dental work that would fix the issue right now -Pt has started taking Ozempic 2 mg 2 weeks ago, denies issues -Current medications: Jardiance 25 mg daily (via [employer]) Metformin ER 1000 mg twice daily Toujeo Max 50 units daily Ozempic 2 mg weekly (via [employer]) -Current home glucose readings fasting glucose: 140-160 -Denies hypoglycemic/hyperglycemic symptoms -Educated on A1c and blood sugar goals; Prevention and management of hypoglycemic episodes; carb counting -Assessed pt finances - she gets Jardiance, Ozempic through PAP and reports Toujeo is affordable at $35 per month; advised her to discuss financial issues regarding dental work with CCM SW or care guide -Advised to limit carbs to <50g per meal and <15g per snack -Recommend to continue current medication  Atrial Fibrillation (Goal: prevent stroke and major bleeding) -Controlled - pt reports occasional HR to 110s but reverts back to baseline 90s quickly -CHADSVASC: 8 -Current treatment: Rate control: Metoprolol succinate 100 mg twice a day Anticoagulation: Warfarin 3 mg as directed -Counseled on increased risk of stroke due to Afib and benefits of anticoagulation for stroke prevention; importance of adherence to anticoagulant exactly as prescribed; -Recommended to continue current medication  Patient Goals/Self-Care Activities Patient will:  - take medications as prescribed -focus on medication adherence by pill box -check glucose daily, document, and provide at future appointments -check blood pressure daily, document, and provide at future appointments -Limit carbs to <50 g per meal and <15 g per snack -Discuss financial burden regarding dental work with CCM SW  Medication Assistance:  [employer] - approved  through 02/03/21 Jardiance - [employer] - approved through 03/06/21   Compliance/Adherence/Medication fill history: Care Gaps: Zoster  Covid booster (due 11/03/19) Eye exam (03/20/20)  Star-Rating Drugs: Telmisartan - LF 07/30/20 x 90 ds Metformin - LF 09/11/20 x 90 ds Atorvastatin - LF 09/11/20 x 90 ds  Patient's preferred pharmacy is:  [facility] - [place], [place] - [address] [place] [address] Phone: [phone] Fax: [phone]  Uses pill box? Yes Pt endorses 100% compliance  We discussed: Reviewed patient's [facility] medication and Epic medication profile assuring there are no discrepancies or gaps in therapy. Confirmed all fill dates appropriate and verified with patient that there is a sufficient quantity of all prescribed medications at home. Informed patient to call me any time if needing medications before scheduled deliveries.   Patient decided to: Utilize [facility] pharmacy for medication synchronization, packaging and delivery  Care Plan and Follow Up Patient Decision:  Patient agrees to Care Plan and Follow-up.  Plan: Telephone follow up appointment with care management team member scheduled for:  3 months  Charlene Brooke, PharmD, [place], CPP Clinical Pharmacist [facility] at [facility] [phone]

## 2020-10-06 NOTE — Patient Instructions (Signed)
Visit Information  Phone number for Pharmacist: [phone]   Goals Addressed             This Visit's Progress    Manage My Medicine       Timeframe:  Long-Range Goal Priority:  High Start Date:      07/06/20                       Expected End Date:   01/06/21                    Follow Up Date Nov 2022   - call for medicine refill 2 or 3 days before it runs out - call if I am sick and can't take my medicine - keep a list of all the medicines I take; vitamins and herbals too  -check glucose daily, document, and provide at future appointments -check blood pressure daily, document, and provide at future appointments -Limit carbs to <50 g per meal and <15 g per snack -Discuss financial burden regarding dental work with CCM SW    Why is this important?   These steps will help you keep on track with your medicines.   Notes:         Care Plan : [place]  Updates made by Charlton Haws, RPH since 10/06/2020 12:00 AM     Problem: DM, HTN, HLD, CAD, CHF   Priority: High     Long-Range Goal: Disease management   Start Date: 04/07/2020  Expected End Date: 04/07/2021  This Visit's Progress: On track  Recent Progress: On track  Priority: High  Note:   Current Barriers:  Unable to independently afford treatment regimen Unable to independently monitor therapeutic efficacy Unable to achieve control of Diabetes   Pharmacist Clinical Goal(s):  Patient will verbalize ability to afford treatment regimen achieve adherence to monitoring guidelines and medication adherence to achieve therapeutic efficacy achieve control of diabetes as evidenced by improvement in A1c through collaboration with PharmD and provider.   Interventions: 1:1 collaboration with [name], MD regarding development and update of comprehensive plan of care as evidenced by provider attestation and co-signature Inter-disciplinary care team collaboration (see longitudinal plan of  care) Comprehensive medication review performed; medication list updated in electronic medical record  Hypertension / Heart Failure (BP goal <130/80) -Controlled - BP is at goal; pt endorses compliance and denies issues -Current home readings: 117/76 -HF Type: Combined Systolic and Diastolic -NYHA Class: II (slight limitation of activity) -Ejection fraction: 50-55% (Date: 09/02/2014) -Current treatment: Metoprolol succinate 100 mg twice daily  Telmisartan 40 mg daily Spironolactone 25 mg daily Furosemide 20 mg daily Potassium chloride 20 meq twice daily Nitroglycerin 0.4 mg as needed -Denies hypotensive/hypertensive symptoms -Counseled to monitor BP at home twice a week, document, and provide log at future appointments -Recommend to continue current medication  Hyperlipidemia: (LDL goal < 70) -Controlled - LDL is at goal; pt endorses compliance and denies side effects -Current treatment: Atorvastatin 40 mg daily -Educated on Cholesterol goals -Recommended to continue current medication  Diabetes (A1c goal <8%) -Uncontrolled - pt reports she has dental issues and it hard for her to chew solid food; she eats a lot of mashed potatoes, soft foods that are higher in carbs because of this; she reports she cannot afford the dental work that would fix the issue right now -Pt has started taking Ozempic 2 mg 2 weeks ago, denies issues -Current medications: Jardiance 25 mg  daily (via [employer]) Metformin ER 1000 mg twice daily Toujeo Max 50 units daily Ozempic 2 mg weekly (via [employer]) -Current home glucose readings fasting glucose: 140-160 -Denies hypoglycemic/hyperglycemic symptoms -Educated on A1c and blood sugar goals; Prevention and management of hypoglycemic episodes; carb counting -Assessed pt finances - she gets Jardiance, Ozempic through PAP and reports Toujeo is affordable at $35 per month; advised her to discuss financial issues regarding dental work with CCM SW or care  guide -Advised to limit carbs to <50g per meal and <15g per snack -Recommend to continue current medication  Atrial Fibrillation (Goal: prevent stroke and major bleeding) -Controlled - pt reports occasional HR to 110s but reverts back to baseline 90s quickly -CHADSVASC: 8 -Current treatment: Rate control: Metoprolol succinate 100 mg twice a day Anticoagulation: Warfarin 3 mg as directed -Counseled on increased risk of stroke due to Afib and benefits of anticoagulation for stroke prevention; importance of adherence to anticoagulant exactly as prescribed; -Recommended to continue current medication  Patient Goals/Self-Care Activities Patient will:  - take medications as prescribed -focus on medication adherence by pill box -check glucose daily, document, and provide at future appointments -check blood pressure daily, document, and provide at future appointments -Limit carbs to <50 g per meal and <15 g per snack -Discuss financial burden regarding dental work with CCM SW       Patient verbalizes understanding of instructions provided today and agrees to view in [place].  Telephone follow up appointment with pharmacy team member scheduled for: 3 months  Charlene Brooke, PharmD, [place], CPP Clinical Pharmacist [place] [facility] at [facility] [phone]

## 2020-10-14 ENCOUNTER — Other Ambulatory Visit (HOSPITAL_COMMUNITY): Payer: Self-pay | Admitting: Psychiatry

## 2020-10-20 ENCOUNTER — Telehealth: Payer: Medicare Other

## 2020-10-20 DIAGNOSIS — M47816 Spondylosis without myelopathy or radiculopathy, lumbar region: Secondary | ICD-10-CM | POA: Diagnosis not present

## 2020-10-20 DIAGNOSIS — G894 Chronic pain syndrome: Secondary | ICD-10-CM | POA: Diagnosis not present

## 2020-10-20 DIAGNOSIS — Z79891 Long term (current) use of opiate analgesic: Secondary | ICD-10-CM | POA: Diagnosis not present

## 2020-10-20 DIAGNOSIS — M150 Primary generalized (osteo)arthritis: Secondary | ICD-10-CM | POA: Diagnosis not present

## 2020-10-23 ENCOUNTER — Other Ambulatory Visit: Payer: Self-pay

## 2020-10-23 ENCOUNTER — Ambulatory Visit: Payer: Medicare Other | Admitting: *Deleted

## 2020-10-23 DIAGNOSIS — E1149 Type 2 diabetes mellitus with other diabetic neurological complication: Secondary | ICD-10-CM

## 2020-10-23 DIAGNOSIS — F32A Depression, unspecified: Secondary | ICD-10-CM

## 2020-10-23 DIAGNOSIS — F419 Anxiety disorder, unspecified: Secondary | ICD-10-CM

## 2020-10-24 NOTE — Patient Instructions (Signed)
Visit Information  PATIENT GOALS:  Goals Addressed   None     The patient verbalized understanding of instructions, educational materials, and care plan provided today and declined offer to receive copy of patient instructions, educational materials, and care plan.   Telephone follow up appointment with care management team member scheduled for:2-3 weeks Eduard Clos MSW, LCSW Licensed Clinical Social Worker [place] [phone]

## 2020-10-24 NOTE — Chronic Care Management (AMB) (Signed)
Chronic Care Management    Clinical Social Work Note  10/24/2020 Name: Kristen [name] MRN: [id number] DOB: [birth year]  Kristen [name] is a 75 y.o. year old female who is a primary care patient of Kristen Lima, MD. The CCM team was consulted to assist the patient with chronic disease management and/or care coordination needs related to: [employer] , Mental Health Counseling and Resources, and Grief Counseling.   Engaged with patient by telephone for follow up visit in response to provider referral for social work chronic care management and care coordination services.   Consent to Services:  The patient was given information about Chronic Care Management services, agreed to services, and gave verbal consent prior to initiation of services.  Please see initial visit note for detailed documentation.   Patient agreed to services and consent obtained.   Assessment: Review of patient past medical history, allergies, medications, and health status, including review of relevant consultants reports was performed today as part of a comprehensive evaluation and provision of chronic care management and care coordination services.     SDOH (Social Determinants of Health) assessments and interventions performed:    Advanced Directives Status: Not addressed in this encounter.  CCM Care Plan  Allergies  Allergen Reactions   Metformin And Related Other (See Comments)    Must take XR form only, cannot tolerate Regular release metformin   Cleocin [Clindamycin Hcl] Diarrhea   Codeine Itching   Doxycycline Hyclate Diarrhea   Macrolides And Ketolides Diarrhea   Morphine Hives   Pentazocine Lactate Itching and Nausea And Vomiting    (GENERIC- [place])   Vibramycin [Doxycycline Calcium] Diarrhea and Rash   Clindamycin Diarrhea   Definity [Perflutren Lipid Microsphere] Other (See Comments)    Patient complained of warm sensation in chest x few seconds duration when injected  Definity.No other symptoms   Doxycycline Diarrhea   Metformin Other (See Comments)   Pentazocine Nausea Only   Perflutren Other (See Comments)   Perflutren Lipid Microspheres Other (See Comments)   Sulfa Antibiotics Diarrhea and Rash   Sulfonamide Derivatives Rash    Outpatient Encounter Medications as of 10/23/2020  Medication Sig   ALPRAZolam (XANAX) 1 MG tablet Take 1 tablet (1 mg total) by mouth 3 (three) times daily as needed for anxiety.   Ascorbic Acid (VITAMIN C) 1000 MG tablet Take 1,000 mg by mouth daily.   atorvastatin (LIPITOR) 40 MG tablet TAKE ONE TABLET BY MOUTH ONCE DAILY   Blood Glucose Monitoring Suppl (ONE TOUCH ULTRA 2) w/Device KIT Use TID   Cholecalciferol (VITAMIN D3) 50 MCG (2000 UT) capsule Take 2,000 Units by mouth daily.   cyclobenzaprine (FLEXERIL) 10 MG tablet Take 10 mg by mouth 3 (three) times daily as needed for muscle spasms.   empagliflozin (JARDIANCE) 25 MG TABS tablet Take 1 tablet (25 mg total) by mouth daily. Via BI CARES pt assistance   furosemide (LASIX) 20 MG tablet Take 1 tablet (20 mg total) by mouth every morning.   gabapentin (NEURONTIN) 300 MG capsule Take 1 capsule (300 mg total) by mouth 3 (three) times daily.   glucose blood (COOL BLOOD GLUCOSE TEST STRIPS) test strip Use to test blood sugar TID. DX: E11.9   insulin glargine, 2 Unit Dial, (TOUJEO MAX SOLOSTAR) 300 UNIT/ML Solostar Pen Inject 50 Units into the skin daily.   Insulin Pen Needle (NOVOFINE) 32G X 6 MM MISC 1 Act by Does not apply route daily.   Lancets (ONETOUCH ULTRASOFT) lancets Use as instructed  magnesium oxide (MAG-OX) 400 MG tablet Take 1 tablet (400 mg total) by mouth 2 (two) times daily. WITH BREAKFAST AND EVENING MEALS.   metFORMIN (GLUCOPHAGE-XR) 500 MG 24 hr tablet TAKE TWO TABLETS BY MOUTH TWICE DAILY WITH FOOD   metoprolol succinate (TOPROL-XL) 100 MG 24 hr tablet Take 1 tablet (100 mg total) by mouth 2 (two) times daily. Take with or immediately following a meal.    Multiple Vitamin (MULTIVITAMIN) tablet Take 1 tablet by mouth daily.   nitroGLYCERIN (NITROSTAT) 0.4 MG SL tablet Place 1 tablet (0.4 mg total) under the tongue every 5 (five) minutes as needed for chest pain (x 3 doses). Reported on 05/08/2015   PERCOCET 10-325 MG tablet Take 1 tablet by mouth 5 (five) times daily as needed.   potassium chloride SA (KLOR-CON) 20 MEQ tablet Take 1 tablet (20 mEq total) by mouth 2 (two) times daily.   Semaglutide, 2 MG/DOSE, (OZEMPIC, 2 MG/DOSE,) 8 MG/3ML SOPN Inject 2 mg into the skin once a week.   spironolactone (ALDACTONE) 25 MG tablet Take 1 tablet (25 mg total) by mouth daily.   telmisartan (MICARDIS) 40 MG tablet TAKE ONE TABLET BY MOUTH ONCE DAILY   thiamine 100 MG tablet Take 1 tablet (100 mg total) by mouth daily.   warfarin (COUMADIN) 3 MG tablet TAKE ONE TABLET BY MOUTH AS DIRECTED by clinic   No facility-administered encounter medications on file as of 10/23/2020.    Patient Active Problem List   Diagnosis Date Noted   PAF (paroxysmal atrial fibrillation) ([place])    Poor dentition requiring referral to dentistry 03/26/2020   Thiamine deficiency 12/30/2018   Vitamin B12 deficiency anemia due to intrinsic factor deficiency 12/20/2018   CHF (congestive heart failure), NYHA class I, chronic, combined ([place]) 12/19/2018   Coronary artery disease involving native coronary artery of native heart with angina pectoris ([place]) 12/19/2018   Obesity with body mass index (BMI) of 30.0 to 39.9 01/15/2018   Chronic idiopathic constipation 12/27/2017   Type 2 diabetes mellitus ([place]) 10/30/2017   Eczema 08/07/2017   Insomnia secondary to chronic pain 08/04/2016   Visit for screening mammogram 04/14/2016   LV dysfunction    Routine general medical examination at a health care facility 05/03/2013   DM (diabetes mellitus), type 2 with peripheral vascular complications ([place]) 32/35/5732   PVC's/nonsustained VT 10/24/2012   H/O: CVA (cerebrovascular accident) 02/09/2012    Hypothyroidism 05/27/2011   Fibromyalgia 10/08/2010   Long term current use of anticoagulant 05/05/2010   Low back pain, non-specific 05/12/2008   Cognitive dysfunction associated with depression 01/29/2007   Osteoarthritis 11/22/2006   Hyperlipidemia associated with type 2 diabetes mellitus ([place]) 09/13/2006   Coronary atherosclerosis 09/13/2006   GERD 09/13/2006   Hypertension associated with diabetes ([place]) 08/30/2006    Conditions to be addressed/monitored: Anxiety and Depression; Mental Health Concerns   Care Plan : LCSW Plan of Care  Updates made by Deirdre Peer, LCSW since 10/24/2020 12:00 AM     Problem: Lacks strategy for management of anxiety around sleeping and fear of dying.   Priority: High     Long-Range Goal: Establish and Maintain plan for anxiety management   Start Date: 04/09/2020  Expected End Date: 11/03/2020  This Visit's Progress: On track  Recent Progress: On track  Priority: High  Note:   Current Barriers:  Pt continues to have worry/fear and anxiety. Reports "afraid to go to sleep and die".  CSW encouraged pt to consider counseling support and she is now  agreeable to referral being made- she will check with her insurance for copay/cost.  Pt acknowledges grief from the loss of her grandson- appears to be coping in a normal/healthy manner Chronic Mental Health needs related to anxiety/PTSD - has participated in an initial visit with [facility] clinician and was not set up for follow up. Pt has decided to pause and does not plan on re-scheduling and/or connecting with new mental health therapist at this time. Limited social support, Mental Health Concerns , and Family and relationship dysfunction Suicidal Ideation/Homicidal Ideation: No Pt shares she is a bit overwhelmed by the pile of mail she has to go through and concerns for disposing of the personal documents-she continues to work on preparing them for shredding/trash Clinical Social Work Goal(s):  Over the  next 30 days pt will establish with counseling options.  Pt will continue to focus on positive self talk and thought patterns, not over-reacting, and seeking ways to better cope with uncomfortable feelings as they arise Over the next 90 days, patient will work with SW monthly by telephone to reduce or manage symptoms related to anxiety Interventions: Pt shared with pt she was contacted to schedule the counseling but declined due to concern about the copay- "they wanted me to every other week and I can't afford that. CSW suggested to pt that she consider once monthly and we can contact her Insurance provider to determine what the copay will be- she reports a $30 copay for specialist and thus is afraid it will be that much or more.  Pt continues to struggle with anxiety, fear of dying and is reminded this is where a counselor could work with her to "unwrap" these emotions.    Solution-Focused Strategies, Mindfulness or Psychologist, educational, Active listening / Reflection utilized , Emotional Supportive Provided, Problem Solving /Task Center , Psychoeducation for mental health needs , Motivational Interviewing, and Brief CBT  Patient Coping Strengths:  Spirituality Hopefulness Self Advocate Able to Communicate Effectively [place] and trust with her Psychiatrist and PCP Patient Self Care Deficits:  Lacks social connections Patient Goals:  -reconsider the counseling and maybe once monthly counseling visits - Continue with visits with  Dr Casimiro Needle and further discuss with him about resuming therapy if you feel needed. - practice relaxation or meditation  -continue with positive self talk, "don't sweat the small stuff", and allowing yourself to be more aware of your emotions and actions that come - reach out to CSW and/or Dr Casimiro Needle if needs arise and want to pursue counseling appointment before scheduled visit with Dr Casimiro Needle  Follow Up Plan: Appointment scheduled for SW follow up with client by phone  on:  2-3 weeks      Follow Up Plan: SW will follow up with patient by phone over the next 2-3 weeks      Eduard Clos MSW, [place] Licensed Clinical Social Worker [place] [phone]

## 2020-10-26 ENCOUNTER — Other Ambulatory Visit: Payer: Self-pay | Admitting: *Deleted

## 2020-10-29 ENCOUNTER — Other Ambulatory Visit: Payer: Self-pay | Admitting: Internal Medicine

## 2020-10-29 DIAGNOSIS — E1149 Type 2 diabetes mellitus with other diabetic neurological complication: Secondary | ICD-10-CM

## 2020-10-29 DIAGNOSIS — Z794 Long term (current) use of insulin: Secondary | ICD-10-CM

## 2020-10-29 DIAGNOSIS — M797 Fibromyalgia: Secondary | ICD-10-CM

## 2020-10-29 DIAGNOSIS — E1151 Type 2 diabetes mellitus with diabetic peripheral angiopathy without gangrene: Secondary | ICD-10-CM

## 2020-11-03 ENCOUNTER — Telehealth: Payer: Self-pay | Admitting: Pharmacist

## 2020-11-03 DIAGNOSIS — I5042 Chronic combined systolic (congestive) and diastolic (congestive) heart failure: Secondary | ICD-10-CM

## 2020-11-03 NOTE — Progress Notes (Signed)
Chronic Care Management Pharmacy Assistant   Name: Kristen [name]  MRN: [id number] DOB: 1945-11-18   Reason for Encounter: Medication Review    Medications: Outpatient Encounter Medications as of 11/03/2020  Medication Sig   ALPRAZolam (XANAX) 1 MG tablet Take 1 tablet (1 mg total) by mouth 3 (three) times daily as needed for anxiety.   Ascorbic Acid (VITAMIN C) 1000 MG tablet Take 1,000 mg by mouth daily.   atorvastatin (LIPITOR) 40 MG tablet TAKE ONE TABLET BY MOUTH ONCE DAILY   Blood Glucose Monitoring Suppl (ONE TOUCH ULTRA 2) w/Device KIT Use TID   Cholecalciferol (VITAMIN D3) 50 MCG (2000 UT) capsule Take 2,000 Units by mouth daily.   cyclobenzaprine (FLEXERIL) 10 MG tablet Take 10 mg by mouth 3 (three) times daily as needed for muscle spasms.   empagliflozin (JARDIANCE) 25 MG TABS tablet Take 1 tablet (25 mg total) by mouth daily. Via BI CARES pt assistance   furosemide (LASIX) 20 MG tablet Take 1 tablet (20 mg total) by mouth every morning.   gabapentin (NEURONTIN) 300 MG capsule TAKE ONE CAPSULE BY MOUTH three times daily   glucose blood (COOL BLOOD GLUCOSE TEST STRIPS) test strip Use to test blood sugar TID. DX: E11.9   Insulin Pen Needle (NOVOFINE) 32G X 6 MM MISC 1 Act by Does not apply route daily.   Lancets (ONETOUCH ULTRASOFT) lancets Use as instructed   magnesium oxide (MAG-OX) 400 MG tablet Take 1 tablet (400 mg total) by mouth 2 (two) times daily. WITH BREAKFAST AND EVENING MEALS.   metFORMIN (GLUCOPHAGE-XR) 500 MG 24 hr tablet TAKE TWO TABLETS BY MOUTH TWICE DAILY WITH FOOD   metoprolol succinate (TOPROL-XL) 100 MG 24 hr tablet Take 1 tablet (100 mg total) by mouth 2 (two) times daily. Take with or immediately following a meal.   Multiple Vitamin (MULTIVITAMIN) tablet Take 1 tablet by mouth daily.   nitroGLYCERIN (NITROSTAT) 0.4 MG SL tablet Place 1 tablet (0.4 mg total) under the tongue every 5 (five) minutes as needed for chest pain (x 3 doses). Reported on  05/08/2015   PERCOCET 10-325 MG tablet Take 1 tablet by mouth 5 (five) times daily as needed.   potassium chloride SA (KLOR-CON) 20 MEQ tablet Take 1 tablet (20 mEq total) by mouth 2 (two) times daily.   Semaglutide, 2 MG/DOSE, (OZEMPIC, 2 MG/DOSE,) 8 MG/3ML SOPN Inject 2 mg into the skin once a week.   spironolactone (ALDACTONE) 25 MG tablet Take 1 tablet (25 mg total) by mouth daily.   telmisartan (MICARDIS) 40 MG tablet TAKE ONE TABLET BY MOUTH ONCE DAILY   thiamine 100 MG tablet Take 1 tablet (100 mg total) by mouth daily.   TOUJEO MAX SOLOSTAR 300 UNIT/ML Solostar Pen INJECT 50 UNITS SUBCUTANEOUSLY DAILY   warfarin (COUMADIN) 3 MG tablet TAKE ONE TABLET BY MOUTH AS DIRECTED by clinic   No facility-administered encounter medications on file as of 11/03/2020.   Reviewed chart for medication changes ahead of medication coordination call.  No OVs, Consults, or hospital visits since last care coordination call/Pharmacist visit.  No medication changes indicated  BP Readings from Last 3 Encounters:  09/21/20 132/74  05/13/20 110/72  03/26/20 138/70    Lab Results  Component Value Date   HGBA1C 8.6 (H) 09/21/2020     Patient obtains medications through Vials  90 Days   Last adherence delivery included:  Mag Oxide 400 mg 1 tab twice daily 07/27/20 Metoprolol succinate 100 mg 1 tab twice daily  07/27/20 Atorvastatin 40 mg 1 tab daily 07/27/20 Potassium CL ER 20 MEq 1 tab twice daily 07/08/20 Metformin Er 500 mg 2 tabs twice daily 07/08/20 Furosemide 20 mg 1 tab daily Gabapentin 300 mg prn  Patient is due for next adherence delivery on: 11/09/20.  Called patient and reviewed medications and coordinated delivery. This delivery to include: Telmisartan 40 mg 1 tab daily 07/30/20 Toujeo Max 300 units inject 50 units day Cyclobenzaprine 10 mg 1 tab 3 times a day Warfarin 3 mg 2 tabs on Monday, 1 1/2 tab on Friday and all other days 1 tab Spironolactone 25 mg 1 tab twice a day    Patient needs  refills for Toujeo max.  Confirmed delivery date of 11/09/20, advised patient that pharmacy will contact them the morning of delivery.   [place] Pharmacist Assistant [phone]   Time spent:40

## 2020-11-04 DIAGNOSIS — E785 Hyperlipidemia, unspecified: Secondary | ICD-10-CM | POA: Diagnosis not present

## 2020-11-04 DIAGNOSIS — F419 Anxiety disorder, unspecified: Secondary | ICD-10-CM

## 2020-11-04 DIAGNOSIS — E1149 Type 2 diabetes mellitus with other diabetic neurological complication: Secondary | ICD-10-CM

## 2020-11-04 DIAGNOSIS — F32A Depression, unspecified: Secondary | ICD-10-CM | POA: Diagnosis not present

## 2020-11-04 DIAGNOSIS — Z794 Long term (current) use of insulin: Secondary | ICD-10-CM | POA: Diagnosis not present

## 2020-11-04 DIAGNOSIS — I25119 Atherosclerotic heart disease of native coronary artery with unspecified angina pectoris: Secondary | ICD-10-CM

## 2020-11-04 DIAGNOSIS — E1169 Type 2 diabetes mellitus with other specified complication: Secondary | ICD-10-CM

## 2020-11-04 DIAGNOSIS — I480 Paroxysmal atrial fibrillation: Secondary | ICD-10-CM

## 2020-11-04 DIAGNOSIS — I5042 Chronic combined systolic (congestive) and diastolic (congestive) heart failure: Secondary | ICD-10-CM | POA: Diagnosis not present

## 2020-11-04 MED ORDER — SPIRONOLACTONE 25 MG PO TABS: 25.0000 mg | ORAL_TABLET | Freq: Every day | ORAL | 0 refills | Status: DC

## 2020-11-04 NOTE — Addendum Note (Signed)
Addended by: Charlton Haws on: 11/04/2020 01:36 PM   Modules accepted: Orders

## 2020-11-05 ENCOUNTER — Telehealth: Payer: Self-pay | Admitting: *Deleted

## 2020-11-05 ENCOUNTER — Telehealth: Payer: Medicare Other

## 2020-11-05 ENCOUNTER — Other Ambulatory Visit: Payer: Self-pay | Admitting: *Deleted

## 2020-11-05 ENCOUNTER — Encounter: Payer: Self-pay | Admitting: *Deleted

## 2020-11-05 DIAGNOSIS — F339 Major depressive disorder, recurrent, unspecified: Secondary | ICD-10-CM

## 2020-11-05 NOTE — Telephone Encounter (Signed)
  Chronic Care Management   Follow Up Note   11/05/2020 Name: Kristen [name] MRN: [id number] DOB: 1945-08-03   Referred by: [name], MD Reason for referral : Chronic Care Management (CCM RN CM Follow Outreach- unsuccessful second attempt)  A second unsuccessful telephone outreach was attempted today. The patient was referred to the case management team for assistance with care management and care coordination.   Follow Up Plan:  A HIPPA compliant phone message was left for the patient providing contact information and requesting a return call Will place request with scheduling care guide to contact patient to re-schedule today's missed CCM RN follow up telephone appointment   [name], RN, BSN, [place] [phone]: direct office [phone]: mobile

## 2020-11-06 ENCOUNTER — Other Ambulatory Visit: Payer: Self-pay | Admitting: Internal Medicine

## 2020-11-06 DIAGNOSIS — E1151 Type 2 diabetes mellitus with diabetic peripheral angiopathy without gangrene: Secondary | ICD-10-CM

## 2020-11-06 DIAGNOSIS — Z7901 Long term (current) use of anticoagulants: Secondary | ICD-10-CM

## 2020-11-06 DIAGNOSIS — E1159 Type 2 diabetes mellitus with other circulatory complications: Secondary | ICD-10-CM

## 2020-11-06 DIAGNOSIS — I2510 Atherosclerotic heart disease of native coronary artery without angina pectoris: Secondary | ICD-10-CM

## 2020-11-10 ENCOUNTER — Ambulatory Visit: Payer: Medicare Other

## 2020-11-10 DIAGNOSIS — H2513 Age-related nuclear cataract, bilateral: Secondary | ICD-10-CM | POA: Diagnosis not present

## 2020-11-10 DIAGNOSIS — E119 Type 2 diabetes mellitus without complications: Secondary | ICD-10-CM | POA: Diagnosis not present

## 2020-11-10 DIAGNOSIS — H52203 Unspecified astigmatism, bilateral: Secondary | ICD-10-CM | POA: Diagnosis not present

## 2020-11-12 ENCOUNTER — Other Ambulatory Visit: Payer: Self-pay

## 2020-11-12 ENCOUNTER — Ambulatory Visit (INDEPENDENT_AMBULATORY_CARE_PROVIDER_SITE_OTHER): Payer: Medicare Other

## 2020-11-12 DIAGNOSIS — Z7901 Long term (current) use of anticoagulants: Secondary | ICD-10-CM | POA: Diagnosis not present

## 2020-11-12 DIAGNOSIS — D510 Vitamin B12 deficiency anemia due to intrinsic factor deficiency: Secondary | ICD-10-CM | POA: Diagnosis not present

## 2020-11-12 LAB — POCT INR: INR: 3.5 — AB (ref 2.0–3.0)

## 2020-11-12 MED ORDER — CYANOCOBALAMIN 1000 MCG/ML IJ SOLN
1000.0000 ug | Freq: Once | INTRAMUSCULAR | Status: AC
  Administered 2020-11-12: 1000 ug via INTRAMUSCULAR

## 2020-11-12 NOTE — Progress Notes (Signed)
Patient ID: Kristen [name], female   DOB: 12-26-1945, 75 y.o.   MRN: [id number]  Medical screening examination/treatment/procedure(s) were performed by non-physician practitioner and as supervising physician I was immediately available for consultation/collaboration.  I agree with above. Cathlean Cower, MD

## 2020-11-12 NOTE — Progress Notes (Signed)
Pt requested B12 inj.  Per orders of Dr. Jeneen Rinks in Dr. Adah Salvage absence, injection of B12 in L deltoid given by Randall An. Patient tolerated injection well.

## 2020-11-12 NOTE — Patient Instructions (Addendum)
Pre visit review using our clinic review tool, if applicable. No additional management support is needed unless otherwise documented below in the visit note.  Hold dose today and then continue to take 1 tablet daily except 2 tablets on Mon and 1 1/2 on Fridays. Re-check in 4 weeks.

## 2020-11-17 DIAGNOSIS — M47816 Spondylosis without myelopathy or radiculopathy, lumbar region: Secondary | ICD-10-CM | POA: Diagnosis not present

## 2020-11-17 DIAGNOSIS — M150 Primary generalized (osteo)arthritis: Secondary | ICD-10-CM | POA: Diagnosis not present

## 2020-11-17 DIAGNOSIS — Z79891 Long term (current) use of opiate analgesic: Secondary | ICD-10-CM | POA: Diagnosis not present

## 2020-11-17 DIAGNOSIS — G894 Chronic pain syndrome: Secondary | ICD-10-CM | POA: Diagnosis not present

## 2020-11-19 LAB — HM DIABETES EYE EXAM

## 2020-11-20 ENCOUNTER — Ambulatory Visit (INDEPENDENT_AMBULATORY_CARE_PROVIDER_SITE_OTHER): Payer: Medicare Other | Admitting: *Deleted

## 2020-11-20 DIAGNOSIS — F32A Depression, unspecified: Secondary | ICD-10-CM

## 2020-11-20 DIAGNOSIS — F419 Anxiety disorder, unspecified: Secondary | ICD-10-CM

## 2020-11-20 DIAGNOSIS — Z794 Long term (current) use of insulin: Secondary | ICD-10-CM

## 2020-11-22 NOTE — Patient Instructions (Signed)
Visit Information  PATIENT GOALS:  Goals Addressed             This Visit's Progress    Work on managing my worry and fear       Timeframe:  Long-Range Goal Priority:  High Start Date:    04/09/2020                         Expected End Date:  01/19/2021              Follow Up Date 12/10/20   --call [facility] to inquire about their counseling services - Continue with visits with  Dr Casimiro Needle and further discuss with him about resuming therapy if you feel needed. - practice relaxation or meditation  -continue with positive self talk, "don't sweat the small stuff", and allowing yourself to be more aware of your emotions and actions that come - reach out to CSW and/or Dr Casimiro Needle if needs arise and want to pursue counseling appointment before scheduled visit with Dr Casimiro Needle      Why is this important?   When you are stressed, down or upset, your body reacts too.  For example, your blood pressure may get higher; you may have a headache or stomachache.  When your emotions get the best of you, your body's ability to fight off cold and flu gets weak.  These steps will help you manage your emotions.     Notes:         The patient verbalized understanding of instructions, educational materials, and care plan provided today and declined offer to receive copy of patient instructions, educational materials, and care plan.   Telephone follow up appointment with care management team member scheduled for:12/10/20 Eduard Clos MSW, LCSW Licensed Clinical Social Worker [place] [phone]

## 2020-11-22 NOTE — Chronic Care Management (AMB) (Signed)
Chronic Care Management    Clinical Social Work Note  11/22/2020 Name: Kristen [name] MRN: [id number] DOB: 09-Jan-1946  [name] who is a primary care patient of [name], MD. The CCM team was consulted to assist the patient with chronic disease management and/or care coordination needs related to: Mental Health Counseling and Resources.   Engaged with patient by telephone for follow up visit in response to provider referral for social work chronic care management and care coordination services.   Consent to Services:  The patient was given information about Chronic Care Management services, agreed to services, and gave verbal consent prior to initiation of services.  Please see initial visit note for detailed documentation.   Patient agreed to services and consent obtained.   Assessment: Review of patient past medical history, allergies, medications, and health status, including review of relevant consultants reports was performed today as part of a comprehensive evaluation and provision of chronic care management and care coordination services.     SDOH (Social Determinants of Health) assessments and interventions performed:    Advanced Directives Status: Not addressed in this encounter.  CCM Care Plan  Allergies  Allergen Reactions   Metformin And Related Other (See Comments)    Must take XR form only, cannot tolerate Regular release metformin   Cleocin [Clindamycin Hcl] Diarrhea   Codeine Itching   Doxycycline Hyclate Diarrhea   Macrolides And Ketolides Diarrhea   Morphine Hives   Pentazocine Lactate Itching and Nausea And Vomiting    (GENERIC- [place])   Vibramycin [Doxycycline Calcium] Diarrhea and Rash   Clindamycin Diarrhea   Definity [Perflutren Lipid Microsphere] Other (See Comments)    Patient complained of warm sensation in chest x few seconds duration when injected Definity.No other symptoms   Doxycycline Diarrhea    Metformin Other (See Comments)   Pentazocine Nausea Only   Perflutren Other (See Comments)   Perflutren Lipid Microspheres Other (See Comments)   Sulfa Antibiotics Diarrhea and Rash   Sulfonamide Derivatives Rash    Outpatient Encounter Medications as of 11/20/2020  Medication Sig   ALPRAZolam (XANAX) 1 MG tablet Take 1 tablet (1 mg total) by mouth 3 (three) times daily as needed for anxiety.   Ascorbic Acid (VITAMIN C) 1000 MG tablet Take 1,000 mg by mouth daily.   atorvastatin (LIPITOR) 40 MG tablet TAKE ONE TABLET BY MOUTH ONCE DAILY   Blood Glucose Monitoring Suppl (ONE TOUCH ULTRA 2) w/Device KIT Use TID   Cholecalciferol (VITAMIN D3) 50 MCG (2000 UT) capsule Take 2,000 Units by mouth daily.   cyclobenzaprine (FLEXERIL) 10 MG tablet Take 10 mg by mouth 3 (three) times daily as needed for muscle spasms.   empagliflozin (JARDIANCE) 25 MG TABS tablet Take 1 tablet (25 mg total) by mouth daily. Via BI CARES pt assistance   furosemide (LASIX) 20 MG tablet Take 1 tablet (20 mg total) by mouth every morning.   gabapentin (NEURONTIN) 300 MG capsule TAKE ONE CAPSULE BY MOUTH three times daily   glucose blood (COOL BLOOD GLUCOSE TEST STRIPS) test strip Use to test blood sugar TID. DX: E11.9   Insulin Pen Needle (NOVOFINE) 32G X 6 MM MISC 1 Act by Does not apply route daily.   Lancets (ONETOUCH ULTRASOFT) lancets Use as instructed   magnesium oxide (MAG-OX) 400 MG tablet Take 1 tablet (400 mg total) by mouth 2 (two) times daily. WITH BREAKFAST AND EVENING MEALS.   metFORMIN (GLUCOPHAGE-XR) 500 MG 24  hr tablet TAKE TWO TABLETS BY MOUTH TWICE DAILY WITH FOOD   metoprolol succinate (TOPROL-XL) 100 MG 24 hr tablet Take 1 tablet (100 mg total) by mouth 2 (two) times daily. Take with or immediately following a meal.   Multiple Vitamin (MULTIVITAMIN) tablet Take 1 tablet by mouth daily.   nitroGLYCERIN (NITROSTAT) 0.4 MG SL tablet Place 1 tablet (0.4 mg total) under the tongue every 5 (five) minutes  as needed for chest pain (x 3 doses). Reported on 05/08/2015   PERCOCET 10-325 MG tablet Take 1 tablet by mouth 5 (five) times daily as needed.   potassium chloride SA (KLOR-CON) 20 MEQ tablet Take 1 tablet (20 mEq total) by mouth 2 (two) times daily.   Semaglutide, 2 MG/DOSE, (OZEMPIC, 2 MG/DOSE,) 8 MG/3ML SOPN Inject 2 mg into the skin once a week.   spironolactone (ALDACTONE) 25 MG tablet Take 1 tablet (25 mg total) by mouth daily.   telmisartan (MICARDIS) 40 MG tablet TAKE ONE TABLET BY MOUTH daily   thiamine 100 MG tablet Take 1 tablet (100 mg total) by mouth daily.   TOUJEO MAX SOLOSTAR 300 UNIT/ML Solostar Pen INJECT 50 UNITS SUBCUTANEOUSLY DAILY   warfarin (COUMADIN) 3 MG tablet TAKE ONE TABLET BY MOUTH AS DIRECTED by clinic   No facility-administered encounter medications on file as of 11/20/2020.    Patient Active Problem List   Diagnosis Date Noted   PAF (paroxysmal atrial fibrillation) ([place])    Poor dentition requiring referral to dentistry 03/26/2020   Thiamine deficiency 12/30/2018   Vitamin B12 deficiency anemia due to intrinsic factor deficiency 12/20/2018   CHF (congestive heart failure), NYHA class I, chronic, combined ([place]) 12/19/2018   Coronary artery disease involving native coronary artery of native heart with angina pectoris ([place]) 12/19/2018   Obesity with body mass index (BMI) of 30.0 to 39.9 01/15/2018   Chronic idiopathic constipation 12/27/2017   Type 2 diabetes mellitus ([place]) 10/30/2017   Eczema 08/07/2017   Insomnia secondary to chronic pain 08/04/2016   Visit for screening mammogram 04/14/2016   LV dysfunction    Routine general medical examination at a health care facility 05/03/2013   DM (diabetes mellitus), type 2 with peripheral vascular complications ([place]) 93/26/7124   PVC's/nonsustained VT 10/24/2012   H/O: CVA (cerebrovascular accident) 02/09/2012   Hypothyroidism 05/27/2011   Fibromyalgia 10/08/2010   Long term current use of anticoagulant  05/05/2010   Low back pain, non-specific 05/12/2008   Cognitive dysfunction associated with depression 01/29/2007   Osteoarthritis 11/22/2006   Hyperlipidemia associated with type 2 diabetes mellitus ([place]) 09/13/2006   Coronary atherosclerosis 09/13/2006   GERD 09/13/2006   Hypertension associated with diabetes ([place]) 08/30/2006    Conditions to be addressed/monitored: Depression; Mental Health Concerns   Care Plan : LCSW Plan of Care  Updates made by Deirdre Peer, LCSW since 11/22/2020 12:00 AM     Problem: Lacks strategy for management of anxiety around sleeping and fear of dying.   Priority: High     Long-Range Goal: Establish and Maintain plan for anxiety management   Start Date: 04/09/2020  Expected End Date: 01/19/2021  This Visit's Progress: On track  Recent Progress: On track  Priority: High  Note:   Current Barriers:  Pt continues to have worry/fear and anxiety. Reports "afraid to go to sleep and die".  CSW encouraged pt to consider counseling support and she is now agreeable to referral being made- she will check with her insurance for copay/cost.  Pt acknowledges grief from the  loss of her grandson- appears to be coping in a normal/healthy manner Chronic Mental Health needs related to anxiety/PTSD - has participated in an initial visit with [facility] clinician and was not set up for follow up. Pt has decided to pause and does not plan on re-scheduling and/or connecting with new mental health therapist at this time. Limited social support, Mental Health Concerns , and Family and relationship dysfunction Suicidal Ideation/Homicidal Ideation: No Pt shares she is a bit overwhelmed by the pile of mail she has to go through and concerns for disposing of the personal documents-she continues to work on preparing them for shredding/trash Clinical Social Work Goal(s):  Over the next 30 days pt will establish with counseling options.  Pt will continue to focus on positive self talk and  thought patterns, not over-reacting, and seeking ways to better cope with uncomfortable feelings as they arise Over the next 90 days, patient will work with SW monthly by telephone to reduce or manage symptoms related to anxiety Interventions: CSW shared with pt today by phone information about the [facility] where she may want to consider going for counseling. CSW updated pt on the sliding scale fee option they offer. Pt plans to contact them and inquire further.   Pt shared with pt she was contacted to schedule the counseling but declined due to concern about the copay- "they wanted me to every other week and I can't afford that. CSW suggested to pt that she consider once monthly and we can contact her Insurance provider to determine what the copay will be- she reports a $30 copay for specialist and thus is afraid it will be that much or more.  Pt continues to struggle with anxiety, fear of dying and is reminded this is where a counselor could work with her to "unwrap" these emotions.    Solution-Focused Strategies, Mindfulness or Psychologist, educational, Active listening / Reflection utilized , Emotional Supportive Provided, Problem Solving /Task Center , Psychoeducation for mental health needs , Motivational Interviewing, and Brief CBT  Patient Coping Strengths:  Spirituality Hopefulness Self Advocate Able to Communicate Effectively [place] and trust with her Psychiatrist and PCP Patient Self Care Deficits:  Lacks social connections Patient Goals:  -reconsider the counseling and maybe once monthly counseling visits - Continue with visits with  Dr Casimiro Needle and further discuss with him about resuming therapy if you feel needed. - practice relaxation or meditation  -continue with positive self talk, "don't sweat the small stuff", and allowing yourself to be more aware of your emotions and actions that come - reach out to CSW and/or Dr Casimiro Needle if needs arise and want to pursue counseling  appointment before scheduled visit with Dr Casimiro Needle  Follow Up Plan: Appointment scheduled for SW follow up with client by phone on:  10/ 6/22      Follow Up Plan: Appointment scheduled for SW follow up with client by phone on: 12/10/20      Eduard Clos MSW, [place] Licensed Clinical Social Worker [place] [phone]

## 2020-11-25 ENCOUNTER — Telehealth: Payer: Self-pay | Admitting: Pharmacist

## 2020-11-25 NOTE — Progress Notes (Addendum)
Chronic Care Management Pharmacy Assistant   Name: Kristen [name]  MRN: [id number] DOB: 1945/09/19   Reason for Encounter: Disease State   Conditions to be addressed/monitored: DMII   Recent office visits:  None ID  Recent consult visits:  None iD  Hospital visits:  None in previous 6 months  Medications: Outpatient Encounter Medications as of 11/25/2020  Medication Sig   ALPRAZolam (XANAX) 1 MG tablet Take 1 tablet (1 mg total) by mouth 3 (three) times daily as needed for anxiety.   Ascorbic Acid (VITAMIN C) 1000 MG tablet Take 1,000 mg by mouth daily.   atorvastatin (LIPITOR) 40 MG tablet TAKE ONE TABLET BY MOUTH ONCE DAILY   Blood Glucose Monitoring Suppl (ONE TOUCH ULTRA 2) w/Device KIT Use TID   Cholecalciferol (VITAMIN D3) 50 MCG (2000 UT) capsule Take 2,000 Units by mouth daily.   cyclobenzaprine (FLEXERIL) 10 MG tablet Take 10 mg by mouth 3 (three) times daily as needed for muscle spasms.   empagliflozin (JARDIANCE) 25 MG TABS tablet Take 1 tablet (25 mg total) by mouth daily. Via BI CARES pt assistance   furosemide (LASIX) 20 MG tablet Take 1 tablet (20 mg total) by mouth every morning.   gabapentin (NEURONTIN) 300 MG capsule TAKE ONE CAPSULE BY MOUTH three times daily   glucose blood (COOL BLOOD GLUCOSE TEST STRIPS) test strip Use to test blood sugar TID. DX: E11.9   Insulin Pen Needle (NOVOFINE) 32G X 6 MM MISC 1 Act by Does not apply route daily.   Lancets (ONETOUCH ULTRASOFT) lancets Use as instructed   magnesium oxide (MAG-OX) 400 MG tablet Take 1 tablet (400 mg total) by mouth 2 (two) times daily. WITH BREAKFAST AND EVENING MEALS.   metFORMIN (GLUCOPHAGE-XR) 500 MG 24 hr tablet TAKE TWO TABLETS BY MOUTH TWICE DAILY WITH FOOD   metoprolol succinate (TOPROL-XL) 100 MG 24 hr tablet Take 1 tablet (100 mg total) by mouth 2 (two) times daily. Take with or immediately following a meal.   Multiple Vitamin (MULTIVITAMIN) tablet Take 1 tablet by mouth daily.    nitroGLYCERIN (NITROSTAT) 0.4 MG SL tablet Place 1 tablet (0.4 mg total) under the tongue every 5 (five) minutes as needed for chest pain (x 3 doses). Reported on 05/08/2015   PERCOCET 10-325 MG tablet Take 1 tablet by mouth 5 (five) times daily as needed.   potassium chloride SA (KLOR-CON) 20 MEQ tablet Take 1 tablet (20 mEq total) by mouth 2 (two) times daily.   Semaglutide, 2 MG/DOSE, (OZEMPIC, 2 MG/DOSE,) 8 MG/3ML SOPN Inject 2 mg into the skin once a week.   spironolactone (ALDACTONE) 25 MG tablet Take 1 tablet (25 mg total) by mouth daily.   telmisartan (MICARDIS) 40 MG tablet TAKE ONE TABLET BY MOUTH daily   thiamine 100 MG tablet Take 1 tablet (100 mg total) by mouth daily.   TOUJEO MAX SOLOSTAR 300 UNIT/ML Solostar Pen INJECT 50 UNITS SUBCUTANEOUSLY DAILY   warfarin (COUMADIN) 3 MG tablet TAKE ONE TABLET BY MOUTH AS DIRECTED by clinic   No facility-administered encounter medications on file as of 11/25/2020.     Recent Relevant Labs: Lab Results  Component Value Date/Time   HGBA1C 8.6 (H) 09/21/2020 02:25 PM   HGBA1C 8.0 (A) 03/26/2020 02:32 PM   HGBA1C 8.3 (H) 10/10/2019 03:46 PM   MICROALBUR <0.7 09/21/2020 02:25 PM   MICROALBUR <0.7 04/11/2019 02:55 PM    Kidney Function Lab Results  Component Value Date/Time   CREATININE 0.83 09/21/2020 02:25 PM  CREATININE 0.90 10/16/2019 03:03 PM   CREATININE 0.86 10/10/2019 03:46 PM   GFR 70.27 09/21/2020 02:25 PM   DYJWLKHV 74 10/16/2019 03:03 PM   GFRAA 75 10/16/2019 03:03 PM     Contacted patient on 11/25/20 to discuss diabetes disease state.   Current antihyperglycemic regimen:  Toujeo 50 units daily Ozempic 2 mg weekly Jardiance 25 mg daily Metformin 500 mg 2 tab twice daily   Patient verbally confirms she is taking the above medications as directed. Yes  What diet changes have been made to improve diabetes control? Patient states that she has not made any new changes in her diet  but she does try and watch what she is  eating   What recent interventions/DTPs have been made to improve glycemic control:  None ID  Have there been any recent hospitalizations or ED visits since last visit with CPP? No  Patient reports hypoglycemic reading sometime over 255, states that she doe not have any symptoms shakiness, sweating or vision changes  Patient denies hyperglycemic symptoms, including none  How often are you checking your blood sugar? 3-4 times daily  What are your blood sugars ranging? Patient states that her blood sugar vary not sure of a range Fasting: states sometimes in the am blood sugars are around 130s After meals: Sometimes in the afternoon may be around 222 Bedtime: Sometimes in the evening between 180-255  During the week, how often does your blood glucose drop below 70? Never  Are you checking your feet daily/regularly? Yes  Adherence Review: Is the patient currently on a STATIN medication? Yes Is the patient currently on ACE/ARB medication? Yes Does the patient have >5 day gap between last estimated fill dates? No  Care Gaps: Annual wellness visit in last year? Yes Most Recent BP reading:132/74 09/21/20  If Diabetic: Most recent A1C reading:8.6 09/21/20 Last eye exam / retinopathy screening:03/21/19 Last diabetic foot exam:09/21/20  Star Rating Drugs:  Medication:  Last Fill: Day Supply   Cardiology appointment on 12/11/20 Dr. Caryl Comes [facility] Clinical Pharmacist Assistant [phone]   Time spent:43

## 2020-11-30 ENCOUNTER — Other Ambulatory Visit: Payer: Self-pay | Admitting: Internal Medicine

## 2020-11-30 ENCOUNTER — Other Ambulatory Visit: Payer: Self-pay | Admitting: Cardiovascular Disease

## 2020-11-30 DIAGNOSIS — E1151 Type 2 diabetes mellitus with diabetic peripheral angiopathy without gangrene: Secondary | ICD-10-CM

## 2020-11-30 DIAGNOSIS — I480 Paroxysmal atrial fibrillation: Secondary | ICD-10-CM

## 2020-11-30 DIAGNOSIS — I152 Hypertension secondary to endocrine disorders: Secondary | ICD-10-CM

## 2020-11-30 DIAGNOSIS — E1169 Type 2 diabetes mellitus with other specified complication: Secondary | ICD-10-CM

## 2020-11-30 DIAGNOSIS — E1159 Type 2 diabetes mellitus with other circulatory complications: Secondary | ICD-10-CM

## 2020-12-02 ENCOUNTER — Telehealth: Payer: Self-pay | Admitting: Pharmacist

## 2020-12-02 DIAGNOSIS — E1151 Type 2 diabetes mellitus with diabetic peripheral angiopathy without gangrene: Secondary | ICD-10-CM

## 2020-12-02 DIAGNOSIS — I2510 Atherosclerotic heart disease of native coronary artery without angina pectoris: Secondary | ICD-10-CM

## 2020-12-02 NOTE — Progress Notes (Signed)
Chronic Care Management Pharmacy Assistant   Name: Alesha Jaffee  MRN: [id number] DOB: 1945/11/15  Reason for Encounter: Medication Review   Medications: Outpatient Encounter Medications as of 12/02/2020  Medication Sig   ALPRAZolam (XANAX) 1 MG tablet Take 1 tablet (1 mg total) by mouth 3 (three) times daily as needed for anxiety.   Ascorbic Acid (VITAMIN C) 1000 MG tablet Take 1,000 mg by mouth daily.   atorvastatin (LIPITOR) 40 MG tablet TAKE ONE TABLET BY MOUTH DAILY   Blood Glucose Monitoring Suppl (ONE TOUCH ULTRA 2) w/Device KIT Use TID   Cholecalciferol (VITAMIN D3) 50 MCG (2000 UT) capsule Take 2,000 Units by mouth daily.   cyclobenzaprine (FLEXERIL) 10 MG tablet Take 10 mg by mouth 3 (three) times daily as needed for muscle spasms.   empagliflozin (JARDIANCE) 25 MG TABS tablet Take 1 tablet (25 mg total) by mouth daily. Via BI CARES pt assistance   furosemide (LASIX) 20 MG tablet TAKE ONE TABLET BY MOUTH EVERY MORNING   gabapentin (NEURONTIN) 300 MG capsule TAKE ONE CAPSULE BY MOUTH three times daily   glucose blood (COOL BLOOD GLUCOSE TEST STRIPS) test strip Use to test blood sugar TID. DX: E11.9   Insulin Pen Needle (NOVOFINE) 32G X 6 MM MISC 1 Act by Does not apply route daily.   Lancets (ONETOUCH ULTRASOFT) lancets Use as instructed   magnesium oxide (MAG-OX) 400 MG tablet Take 1 tablet (400 mg total) by mouth 2 (two) times daily. WITH BREAKFAST AND EVENING MEALS.   metFORMIN (GLUCOPHAGE-XR) 500 MG 24 hr tablet TAKE TWO TABLETS BY MOUTH TWICE DAILY WITH food   metoprolol succinate (TOPROL-XL) 100 MG 24 hr tablet TAKE ONE TABLET BY MOUTH TWICE DAILY WITH OR immediately following A meal   Multiple Vitamin (MULTIVITAMIN) tablet Take 1 tablet by mouth daily.   nitroGLYCERIN (NITROSTAT) 0.4 MG SL tablet Place 1 tablet (0.4 mg total) under the tongue every 5 (five) minutes as needed for chest pain (x 3 doses). Reported on 05/08/2015   PERCOCET 10-325 MG tablet Take 1  tablet by mouth 5 (five) times daily as needed.   potassium chloride SA (KLOR-CON) 20 MEQ tablet TAKE ONE TABLET BY MOUTH TWICE DAILY   Semaglutide, 2 MG/DOSE, (OZEMPIC, 2 MG/DOSE,) 8 MG/3ML SOPN Inject 2 mg into the skin once a week.   spironolactone (ALDACTONE) 25 MG tablet Take 1 tablet (25 mg total) by mouth daily.   telmisartan (MICARDIS) 40 MG tablet TAKE ONE TABLET BY MOUTH daily   thiamine 100 MG tablet Take 1 tablet (100 mg total) by mouth daily.   TOUJEO MAX SOLOSTAR 300 UNIT/ML Solostar Pen INJECT 50 UNITS SUBCUTANEOUSLY DAILY   warfarin (COUMADIN) 3 MG tablet TAKE ONE TABLET BY MOUTH AS DIRECTED by clinic   No facility-administered encounter medications on file as of 12/02/2020.   Reviewed chart for medication changes ahead of medication coordination call.  No OVs, Consults, or hospital visits since last care coordination call/Pharmacist visit. (If appropriate, list visit date, provider name)  No medication changes indicated OR if recent visit, treatment plan here.  BP Readings from Last 3 Encounters:  09/21/20 132/74  05/13/20 110/72  03/26/20 138/70    Lab Results  Component Value Date   HGBA1C 8.6 (H) 09/21/2020     Patient obtains medications through Vials  90 Days   Last adherence delivery included:  Telmisartan 40 mg 1 tab daily 07/30/20 Toujeo Max 300 units inject 50 units day Cyclobenzaprine 10 mg 1 tab 3  times a day Warfarin 3 mg 2 tabs on Monday, 1 1/2 tab on Friday and all other days 1 tab Spironolactone 25 mg 1 tab twice a day  Patient is due for next adherence delivery on: 12/14/20.  Called patient and reviewed medications and coordinated delivery. This delivery to include: Mag Oxide 400 mg 1 tab twice daily  Metoprolol succinate 100 mg 1 tab twice daily  Atorvastatin 40 mg 1 tab daily 07/27/20 Potassium CL ER 20 MEq 1 tab twice daily  Metformin Er 500 mg 2 tabs twice daily  Furosemide 20 mg 1 tab daily   Patient needs refills for none  noted.  Confirmed delivery date of 12/14/20, advised patient that pharmacy will contact them the morning of delivery.   [place] Pharmacist Assistant [phone]   Time spent: 68

## 2020-12-02 NOTE — Progress Notes (Signed)
    Chronic Care Management Pharmacy Assistant   Name: Kristen [name]  MRN: [id number] DOB: 1945/05/18   Reason for Encounter: Medication Review     Medications: Outpatient Encounter Medications as of 12/02/2020  Medication Sig   ALPRAZolam (XANAX) 1 MG tablet Take 1 tablet (1 mg total) by mouth 3 (three) times daily as needed for anxiety.   Ascorbic Acid (VITAMIN C) 1000 MG tablet Take 1,000 mg by mouth daily.   atorvastatin (LIPITOR) 40 MG tablet TAKE ONE TABLET BY MOUTH DAILY   Blood Glucose Monitoring Suppl (ONE TOUCH ULTRA 2) w/Device KIT Use TID   Cholecalciferol (VITAMIN D3) 50 MCG (2000 UT) capsule Take 2,000 Units by mouth daily.   cyclobenzaprine (FLEXERIL) 10 MG tablet Take 10 mg by mouth 3 (three) times daily as needed for muscle spasms.   empagliflozin (JARDIANCE) 25 MG TABS tablet Take 1 tablet (25 mg total) by mouth daily. Via BI CARES pt assistance   furosemide (LASIX) 20 MG tablet TAKE ONE TABLET BY MOUTH EVERY MORNING   gabapentin (NEURONTIN) 300 MG capsule TAKE ONE CAPSULE BY MOUTH three times daily   glucose blood (COOL BLOOD GLUCOSE TEST STRIPS) test strip Use to test blood sugar TID. DX: E11.9   Insulin Pen Needle (NOVOFINE) 32G X 6 MM MISC 1 Act by Does not apply route daily.   Lancets (ONETOUCH ULTRASOFT) lancets Use as instructed   magnesium oxide (MAG-OX) 400 MG tablet Take 1 tablet (400 mg total) by mouth 2 (two) times daily. WITH BREAKFAST AND EVENING MEALS.   metFORMIN (GLUCOPHAGE-XR) 500 MG 24 hr tablet TAKE TWO TABLETS BY MOUTH TWICE DAILY WITH food   metoprolol succinate (TOPROL-XL) 100 MG 24 hr tablet TAKE ONE TABLET BY MOUTH TWICE DAILY WITH OR immediately following A meal   Multiple Vitamin (MULTIVITAMIN) tablet Take 1 tablet by mouth daily.   nitroGLYCERIN (NITROSTAT) 0.4 MG SL tablet Place 1 tablet (0.4 mg total) under the tongue every 5 (five) minutes as needed for chest pain (x 3 doses). Reported on 05/08/2015   PERCOCET 10-325 MG tablet Take 1  tablet by mouth 5 (five) times daily as needed.   potassium chloride SA (KLOR-CON) 20 MEQ tablet TAKE ONE TABLET BY MOUTH TWICE DAILY   Semaglutide, 2 MG/DOSE, (OZEMPIC, 2 MG/DOSE,) 8 MG/3ML SOPN Inject 2 mg into the skin once a week.   spironolactone (ALDACTONE) 25 MG tablet Take 1 tablet (25 mg total) by mouth daily.   telmisartan (MICARDIS) 40 MG tablet TAKE ONE TABLET BY MOUTH daily   thiamine 100 MG tablet Take 1 tablet (100 mg total) by mouth daily.   TOUJEO MAX SOLOSTAR 300 UNIT/ML Solostar Pen INJECT 50 UNITS SUBCUTANEOUSLY DAILY   warfarin (COUMADIN) 3 MG tablet TAKE ONE TABLET BY MOUTH AS DIRECTED by clinic   No facility-administered encounter medications on file as of 12/02/2020.   Received call from patient regarding medication management via [facility].  Patient requested an acute fill for alprazolam and percocet to be delivered: 12/04/20 Pharmacy needs refills? No  Confirmed delivery date of 12/04/20, advised patient that pharmacy will contact them the morning of delivery.  [place] Pharmacist Assistant [phone]   Time spent:40

## 2020-12-03 MED ORDER — EMPAGLIFLOZIN 25 MG PO TABS: 25.0000 mg | ORAL_TABLET | Freq: Every day | ORAL | 1 refills | Status: DC

## 2020-12-03 NOTE — Addendum Note (Signed)
Addended by: Charlton Haws on: 12/03/2020 09:13 AM   Modules accepted: Orders

## 2020-12-03 NOTE — Telephone Encounter (Signed)
Received fax from [facility] that they need a new prescription for Jardiance 25 mg.  Printed Rx and Faxed to [facility]

## 2020-12-04 DIAGNOSIS — F419 Anxiety disorder, unspecified: Secondary | ICD-10-CM | POA: Diagnosis not present

## 2020-12-04 DIAGNOSIS — F32A Depression, unspecified: Secondary | ICD-10-CM

## 2020-12-04 DIAGNOSIS — E1149 Type 2 diabetes mellitus with other diabetic neurological complication: Secondary | ICD-10-CM

## 2020-12-04 DIAGNOSIS — Z794 Long term (current) use of insulin: Secondary | ICD-10-CM

## 2020-12-07 ENCOUNTER — Encounter: Payer: Self-pay | Admitting: *Deleted

## 2020-12-07 ENCOUNTER — Telehealth: Payer: Medicare Other

## 2020-12-07 ENCOUNTER — Telehealth: Payer: Self-pay | Admitting: *Deleted

## 2020-12-07 NOTE — Telephone Encounter (Signed)
  Chronic Care Management   Follow Up Note   12/07/2020 Name: Kristen [name] MRN: [id number] DOB: [birth year]  Referred by: [name], MD Reason for referral : Chronic Care Management (CCM RN CM Follow Up Telephone Visit; DMII; CHF, unsuccessful outreach)  An unsuccessful telephone outreach was attempted today. The patient was referred to the case management team for assistance with care management and care coordination.   Follow Up Plan:  A HIPPA compliant phone message was left for the patient providing contact information and requesting a return call- patient did not return my call by end of business day Will place request with scheduling care guide to contact patient to re-schedule today's missed CCM RN follow up telephone appointment  [name], RN, BSN, [place] [phone]: direct office [phone]: mobile

## 2020-12-08 ENCOUNTER — Telehealth: Payer: Self-pay | Admitting: *Deleted

## 2020-12-08 ENCOUNTER — Telehealth: Payer: Self-pay | Admitting: Pharmacist

## 2020-12-08 NOTE — Telephone Encounter (Signed)
Patient came to pick up Ozempic 1 mg pens from pt assistance today. Pt is supposed to be on 2 mg pens. She reports she recently received the shipment of 2 mg pens, she has 4 pens remaining still. It is not clear why we received a shipment of 1 mg pens - no refill order was sent from [place], it may have been automatically refilled from [facility]' prior prescriptions.  Pt returned the 1 mg pens to [place]. Will contact [employer] to make sure 1 mg dose is discontinued, and pt will be continuing 2 mg dose.  Invoice info: Order #: [id number] Shipment # [id number] Customer # [phone] PO Number: [id number] PO date: 11/20/20 Ship date: 12/05/20 Received: 12/08/20

## 2020-12-08 NOTE — Chronic Care Management (AMB) (Signed)
  Care Management   Note  12/08/2020 Name: Anaija Wissink MRN: [id number] DOB: June 11, 1945  [name] who is a primary care patient of [name], MD and is actively engaged with the care management team. I reached out to [name] by phone today to assist with re-scheduling a follow up visit with the RN Case Manager  Follow up plan: Telephone appointment with care management team member scheduled for:12/15/20  [place] Management  Direct Dial: [phone]

## 2020-12-10 ENCOUNTER — Other Ambulatory Visit: Payer: Self-pay

## 2020-12-10 ENCOUNTER — Ambulatory Visit (INDEPENDENT_AMBULATORY_CARE_PROVIDER_SITE_OTHER): Payer: Medicare Other

## 2020-12-10 ENCOUNTER — Telehealth: Payer: Medicare Other

## 2020-12-10 DIAGNOSIS — D510 Vitamin B12 deficiency anemia due to intrinsic factor deficiency: Secondary | ICD-10-CM

## 2020-12-10 DIAGNOSIS — Z23 Encounter for immunization: Secondary | ICD-10-CM | POA: Diagnosis not present

## 2020-12-10 DIAGNOSIS — Z7901 Long term (current) use of anticoagulants: Secondary | ICD-10-CM

## 2020-12-10 LAB — POCT INR: INR: 4.5 — AB (ref 2.0–3.0)

## 2020-12-10 MED ORDER — CYANOCOBALAMIN 1000 MCG/ML IJ SOLN
1000.0000 ug | Freq: Once | INTRAMUSCULAR | Status: AC
  Administered 2020-12-10: 1000 ug via INTRAMUSCULAR

## 2020-12-10 NOTE — Patient Instructions (Addendum)
Pre visit review using our clinic review tool, if applicable. No additional management support is needed unless otherwise documented below in the visit note.  Hold dose today and take 1/2 tablet tomorrow and then change weekly dose to take 1 tablet daily. Re-check in 2 weeks.

## 2020-12-11 ENCOUNTER — Ambulatory Visit: Payer: Medicare Other | Admitting: Cardiovascular Disease

## 2020-12-11 ENCOUNTER — Telehealth: Payer: Medicare Other

## 2020-12-11 NOTE — Progress Notes (Signed)
I have reviewed and agree.

## 2020-12-15 ENCOUNTER — Telehealth: Payer: Medicare Other

## 2020-12-16 ENCOUNTER — Telehealth: Payer: Self-pay | Admitting: Pharmacist

## 2020-12-16 DIAGNOSIS — M47816 Spondylosis without myelopathy or radiculopathy, lumbar region: Secondary | ICD-10-CM | POA: Diagnosis not present

## 2020-12-16 DIAGNOSIS — G894 Chronic pain syndrome: Secondary | ICD-10-CM | POA: Diagnosis not present

## 2020-12-16 DIAGNOSIS — Z79891 Long term (current) use of opiate analgesic: Secondary | ICD-10-CM | POA: Diagnosis not present

## 2020-12-16 DIAGNOSIS — M150 Primary generalized (osteo)arthritis: Secondary | ICD-10-CM | POA: Diagnosis not present

## 2020-12-16 NOTE — Progress Notes (Signed)
    Chronic Care Management Pharmacy Assistant   Name: Laurajean Hosek  MRN: [id number] DOB: 08-Jun-1945  Made a call to patient to reschedule appointment with clinical pharmacist Charlene Brooke from 01/06/21 to 01/11/21 @ 3:15 pm. Patient agreed with new appointment.  [place] Pharmacist Assistant [phone]

## 2020-12-22 ENCOUNTER — Other Ambulatory Visit: Payer: Self-pay

## 2020-12-22 ENCOUNTER — Telehealth (HOSPITAL_BASED_OUTPATIENT_CLINIC_OR_DEPARTMENT_OTHER): Payer: Medicare Other | Admitting: Psychiatry

## 2020-12-22 DIAGNOSIS — F4323 Adjustment disorder with mixed anxiety and depressed mood: Secondary | ICD-10-CM

## 2020-12-22 MED ORDER — ALPRAZOLAM 1 MG PO TABS: ORAL_TABLET | ORAL | 2 refills | Status: DC

## 2020-12-22 NOTE — Progress Notes (Signed)
She is only fair. None Patient ID: Kristen [name], female   DOB: 12/25/1945, 75 y.o.   MRN: [id number] [facility] MD Progress Note  12/22/2020 3:30 PM Kristen [name]  MRN:  [id number] Subjective:  Scared Principal Problem: Adjustment disorder with an anxious mood state  Today the patient is doing fair.  Her anxiety is reasonably well controlled.  She has ongoing issues with her husband.  There is really no new stresses other than the patient's health seems to be failing.  She is a lot of problems with her knees.  I think she is having problems with walking.  She has not really fallen but she is having a hard time walking without assistance.  The patient does speak of how her language seems to be different.  She is having a harder time finding words.  Today we educated her about as you get older you need less of your benzodiazepine dose.  She is been receiving this dose of Xanax from me for many many years and has done extremely well.  However at this time we will go ahead and slowly start reducing it.  She was on 3 mg a day and we will reduce it to 1 mg in the morning and 1 mg at 3 in the afternoon and a half a milligram at night.  We will speak again in 2-1/2 months.  Her anxiety actually very well controlled.  She denies daily depression.  She drinks no alcohol uses no drugs.  She is on multiple medications for diabetes and other medical conditions.  She is obese.  Overall she is functioning fairly well with assistance.  She is frightful about coming to see me by having to walk down a long hallway to get to me.  I shared with her that for the next period of time I will be glad to see her through the phone. Past Medical History:  Diagnosis Date   Arthritis    Blood transfusion without reported diagnosis    Bursitis    CAD (coronary artery disease)    a. s/p CABG 2004.b. stable cath 2014 demonstrating stable CAD and continued patency of her LIMA graft.   Chronic anticoagulation    on coumadin    DDD (degenerative disc disease), lumbar    Depression    Diabetes mellitus    Diastolic dysfunction    per echo in October 2012 with EF 50 to 55%   Fibromyalgia    GERD (gastroesophageal reflux disease) 10/23/2003   Headache(784.0)    Hyperlipidemia    Hypertension    Hypokalemia    LBBB (left bundle branch block)    Lumbar back pain    LV dysfunction    a. EF 45% by cath 2014. b. EF 50-55% by technically difficult echo in 08/2014.   Lymphadenitis    Morbid obesity ([place])    a. Sleep study negative for significant OSA in 11/2014.   PAF (paroxysmal atrial fibrillation) ([place])    Stroke ([place]) 2004   affected speech per pt    Past Surgical History:  Procedure Laterality Date   ABDOMINAL HYSTERECTOMY     ANGIOPLASTY  laminectomy   CHOLECYSTECTOMY     CORONARY ARTERY BYPASS GRAFT     LIMA to LAD    KNEE ARTHROSCOPY     LEFT HEART CATHETERIZATION WITH CORONARY/GRAFT ANGIOGRAM  12/07/2012   Procedure: LEFT HEART CATHETERIZATION WITH Beatrix Fetters;  Surgeon: Blane Ohara, MD;  Location: [facility] C[facility] LAB;  Service: Cardiovascular;;  LUMBAR LAMINECTOMY     x3   SPHINCTEROTOMY     TONSILLECTOMY     Family History:  Family History  Problem Relation Age of Onset   Heart attack Father    Heart disease Father    Stroke Mother    Kidney disease Other    Stroke Other    Arthritis Other    Hypertension Other    Diabetes Other    Colon cancer Paternal Uncle    Depression Sister    Anxiety disorder Maternal Aunt    Family Psychiatric  History:  Social History:  Social History   Substance and Sexual Activity  Alcohol Use No     Social History   Substance and Sexual Activity  Drug Use No    Social History   Socioeconomic History   Marital status: Married    Spouse name: Not on file   Number of children: Not on file   Years of education: Not on file   Highest education level: Not on file  Occupational History   Not on file  Tobacco Use   Smoking status:  Former    Types: Cigarettes    Quit date: 03/07/2001    Years since quitting: 19.8   Smokeless tobacco: Never  Vaping Use   Vaping Use: Never used  Substance and Sexual Activity   Alcohol use: No   Drug use: No   Sexual activity: Not Currently  Other Topics Concern   Not on file  Social History Narrative   Lives locally, has help available if needed.   Social Determinants of Health   Financial Resource Strain: High Risk   Difficulty of Paying Living Expenses: Hard  Food Insecurity: No Food Insecurity   Worried About Charity fundraiser in the Last Year: Never true   Ran Out of Food in the Last Year: Never true  Transportation Needs: No Transportation Needs   Lack of Transportation (Medical): No   Lack of Transportation (Non-Medical): No  Physical Activity: Not on file  Stress: Stress Concern Present   Feeling of Stress : To some extent  Social Connections: Not on file   Additional Social History:                         Sleep: Fair  Appetite:  Fair  Current Medications: Current Outpatient Medications  Medication Sig Dispense Refill   ALPRAZolam (XANAX) 1 MG tablet 1  qam   1   q3;00 PM  half  qhs 75 tablet 2   Ascorbic Acid (VITAMIN C) 1000 MG tablet Take 1,000 mg by mouth daily.     atorvastatin (LIPITOR) 40 MG tablet TAKE ONE TABLET BY MOUTH DAILY 90 tablet 1   Blood Glucose Monitoring Suppl (ONE TOUCH ULTRA 2) w/Device KIT Use TID 1 kit 2   Cholecalciferol (VITAMIN D3) 50 MCG (2000 UT) capsule Take 2,000 Units by mouth daily.     cyclobenzaprine (FLEXERIL) 10 MG tablet Take 10 mg by mouth 3 (three) times daily as needed for muscle spasms.     empagliflozin (JARDIANCE) 25 MG TABS tablet Take 1 tablet (25 mg total) by mouth daily. Via [employer] CARES pt assistance 90 tablet 1   furosemide (LASIX) 20 MG tablet TAKE ONE TABLET BY MOUTH EVERY MORNING 90 tablet 1   gabapentin (NEURONTIN) 300 MG capsule TAKE ONE CAPSULE BY MOUTH three times daily 270 capsule 1   glucose  blood (COOL BLOOD GLUCOSE TEST STRIPS) test strip  Use to test blood sugar TID. DX: E11.9 300 each 1   Insulin Pen Needle (NOVOFINE) 32G X 6 MM MISC 1 Act by Does not apply route daily. 100 each 3   Lancets (ONETOUCH ULTRASOFT) lancets Use as instructed 100 each 12   magnesium oxide (MAG-OX) 400 MG tablet Take 1 tablet (400 mg total) by mouth 2 (two) times daily. WITH BREAKFAST AND EVENING MEALS. 180 tablet 3   metFORMIN (GLUCOPHAGE-XR) 500 MG 24 hr tablet TAKE TWO TABLETS BY MOUTH TWICE DAILY WITH food 260 tablet 0   metoprolol succinate (TOPROL-XL) 100 MG 24 hr tablet TAKE ONE TABLET BY MOUTH TWICE DAILY WITH OR immediately following A meal 180 tablet 1   Multiple Vitamin (MULTIVITAMIN) tablet Take 1 tablet by mouth daily.     nitroGLYCERIN (NITROSTAT) 0.4 MG SL tablet Place 1 tablet (0.4 mg total) under the tongue every 5 (five) minutes as needed for chest pain (x 3 doses). Reported on 05/08/2015 35 tablet 2   PERCOCET 10-325 MG tablet Take 1 tablet by mouth 5 (five) times daily as needed.     potassium chloride SA (KLOR-CON) 20 MEQ tablet TAKE ONE TABLET BY MOUTH TWICE DAILY 180 tablet 1   Semaglutide, 2 MG/DOSE, (OZEMPIC, 2 MG/DOSE,) 8 MG/3ML SOPN Inject 2 mg into the skin once a week. 9 mL 1   spironolactone (ALDACTONE) 25 MG tablet Take 1 tablet (25 mg total) by mouth daily. 90 tablet 0   telmisartan (MICARDIS) 40 MG tablet TAKE ONE TABLET BY MOUTH daily 90 tablet 1   thiamine 100 MG tablet Take 1 tablet (100 mg total) by mouth daily. 90 tablet 1   TOUJEO MAX SOLOSTAR 300 UNIT/ML Solostar Pen INJECT 50 UNITS SUBCUTANEOUSLY DAILY 15 mL 5   warfarin (COUMADIN) 3 MG tablet TAKE ONE TABLET BY MOUTH AS DIRECTED by clinic 60 tablet 2   No current facility-administered medications for this visit.    Lab Results:  No results found. However, due to the size of the patient record, not all encounters were searched. Please check Results Review for a complete set of results.  Physical Findings: AIMS:   , ,  ,  ,    CIWA:    COWS:     Musculoskeletal: Strength & Muscle Tone: within normal limits Gait & Station: normal Patient leans: Right  Psychiatric Specialty Exam: ROS  There were no vitals taken for this visit.There is no height or weight on file to calculate BMI.  General Appearance: NA  Eye Contact::  Good  Speech:  Clear and Coherent  Volume:  Normal  Mood:  NA  Affect:  Congruent  Thought Process:  Coherent  Orientation:  Full (Time, Place, and Person)  Thought Content:  WDL  Suicidal Thoughts:  No  Homicidal Thoughts:  No  Memory:  NA  Judgement:  Good  Insight:  Good  Psychomotor Activity:  Normal  Concentration:  Good  Recall:  Good  Fund of Knowledge:Fair  Language: Good  Akathisia:  No  Handed:  Left  AIMS (if indicated):     Assets:  Desire for Improvement  ADL's:  Intact  Cognition: WNL    This patient's diagnosis is that of an adjustment disorder with an anxious mood state.  She also has will be described as atypical anxiety.  I believe she has been on benzodiazepines for well over a decade.  At this time we will begin the process of slowly reducing it.  She go to 1 mg in the  morning and in the afternoon and a half a milligram at night.  She will return to see me in 2-1/2 months.  At that time we will likely reduce it to taking 1 in the morning and a half midday and a half at night.  We will go slow.  The patient does not seem to be resistant to this at this time.

## 2020-12-24 ENCOUNTER — Ambulatory Visit (INDEPENDENT_AMBULATORY_CARE_PROVIDER_SITE_OTHER): Payer: Medicare Other

## 2020-12-24 ENCOUNTER — Other Ambulatory Visit: Payer: Self-pay

## 2020-12-24 DIAGNOSIS — Z7901 Long term (current) use of anticoagulants: Secondary | ICD-10-CM | POA: Diagnosis not present

## 2020-12-24 LAB — POCT INR: INR: 3.3 — AB (ref 2.0–3.0)

## 2020-12-24 NOTE — Progress Notes (Signed)
Medical screening examination/treatment/procedure(s) were performed by non-physician practitioner and as supervising physician I was immediately available for consultation/collaboration. I agree with above. Shatina Streets, MD   

## 2020-12-24 NOTE — Patient Instructions (Addendum)
Pre visit review using our clinic review tool, if applicable. No additional management support is needed unless otherwise documented below in the visit note.  Hold dose today and then change weekly dose to take 1 tablet daily except take 1/2 tablet on Wednesdays. Recheck in 3 weeks.  

## 2020-12-25 ENCOUNTER — Telehealth: Payer: Medicare Other

## 2020-12-25 ENCOUNTER — Ambulatory Visit (INDEPENDENT_AMBULATORY_CARE_PROVIDER_SITE_OTHER): Payer: Medicare Other | Admitting: *Deleted

## 2020-12-25 DIAGNOSIS — I5042 Chronic combined systolic (congestive) and diastolic (congestive) heart failure: Secondary | ICD-10-CM

## 2020-12-25 DIAGNOSIS — F32A Depression, unspecified: Secondary | ICD-10-CM

## 2020-12-25 DIAGNOSIS — E1149 Type 2 diabetes mellitus with other diabetic neurological complication: Secondary | ICD-10-CM

## 2020-12-28 NOTE — Patient Instructions (Signed)
Visit Information  PATIENT GOALS:  Goals Addressed   None     The patient verbalized understanding of instructions, educational materials, and care plan provided today and declined offer to receive copy of patient instructions, educational materials, and care plan.   Telephone follow up appointment with care management team member scheduled for:02/04/21  Eduard Clos MSW, LCSW Licensed Clinical Social Worker [place] [phone]

## 2020-12-28 NOTE — Chronic Care Management (AMB) (Signed)
Chronic Care Management    Clinical Social Work Note  12/28/2020 Name: Kristen [name] MRN: [id number] DOB: Dec 24, 1945  Kristen [name] is a 75 y.o. year old female who is a primary care patient of [name], MD. The CCM team was consulted to assist the patient with chronic disease management and/or care coordination needs related to: Mental Health Counseling and Resources.   Engaged with patient by telephone for follow up visit in response to provider referral for social work chronic care management and care coordination services.   Consent to Services:  The patient was given information about Chronic Care Management services, agreed to services, and gave verbal consent prior to initiation of services.  Please see initial visit note for detailed documentation.   Patient agreed to services and consent obtained.   Assessment: Review of patient past medical history, allergies, medications, and health status, including review of relevant consultants reports was performed today as part of a comprehensive evaluation and provision of chronic care management and care coordination services.     SDOH (Social Determinants of Health) assessments and interventions performed:    Advanced Directives Status: Not addressed in this encounter.  CCM Care Plan  Allergies  Allergen Reactions   Metformin And Related Other (See Comments)    Must take XR form only, cannot tolerate Regular release metformin   Cleocin [Clindamycin Hcl] Diarrhea   Codeine Itching   Doxycycline Hyclate Diarrhea   Macrolides And Ketolides Diarrhea   Morphine Hives   Pentazocine Lactate Itching and Nausea And Vomiting    (GENERIC- [place])   Vibramycin [Doxycycline Calcium] Diarrhea and Rash   Clindamycin Diarrhea   Definity [Perflutren Lipid Microsphere] Other (See Comments)    Patient complained of warm sensation in chest x few seconds duration when injected Definity.No other symptoms   Doxycycline  Diarrhea   Metformin Other (See Comments)   Pentazocine Nausea Only   Perflutren Other (See Comments)   Perflutren Lipid Microspheres Other (See Comments)   Sulfa Antibiotics Diarrhea and Rash   Sulfonamide Derivatives Rash    Outpatient Encounter Medications as of 12/25/2020  Medication Sig   ALPRAZolam (XANAX) 1 MG tablet 1  qam   1   q3;00 PM  half  qhs   Ascorbic Acid (VITAMIN C) 1000 MG tablet Take 1,000 mg by mouth daily.   atorvastatin (LIPITOR) 40 MG tablet TAKE ONE TABLET BY MOUTH DAILY   Blood Glucose Monitoring Suppl (ONE TOUCH ULTRA 2) w/Device KIT Use TID   Cholecalciferol (VITAMIN D3) 50 MCG (2000 UT) capsule Take 2,000 Units by mouth daily.   cyclobenzaprine (FLEXERIL) 10 MG tablet Take 10 mg by mouth 3 (three) times daily as needed for muscle spasms.   empagliflozin (JARDIANCE) 25 MG TABS tablet Take 1 tablet (25 mg total) by mouth daily. Via BI CARES pt assistance   furosemide (LASIX) 20 MG tablet TAKE ONE TABLET BY MOUTH EVERY MORNING   gabapentin (NEURONTIN) 300 MG capsule TAKE ONE CAPSULE BY MOUTH three times daily   glucose blood (COOL BLOOD GLUCOSE TEST STRIPS) test strip Use to test blood sugar TID. DX: E11.9   Insulin Pen Needle (NOVOFINE) 32G X 6 MM MISC 1 Act by Does not apply route daily.   Lancets (ONETOUCH ULTRASOFT) lancets Use as instructed   magnesium oxide (MAG-OX) 400 MG tablet Take 1 tablet (400 mg total) by mouth 2 (two) times daily. WITH BREAKFAST AND EVENING MEALS.   metFORMIN (GLUCOPHAGE-XR) 500 MG 24 hr tablet TAKE TWO TABLETS BY  MOUTH TWICE DAILY WITH food   metoprolol succinate (TOPROL-XL) 100 MG 24 hr tablet TAKE ONE TABLET BY MOUTH TWICE DAILY WITH OR immediately following A meal   Multiple Vitamin (MULTIVITAMIN) tablet Take 1 tablet by mouth daily.   nitroGLYCERIN (NITROSTAT) 0.4 MG SL tablet Place 1 tablet (0.4 mg total) under the tongue every 5 (five) minutes as needed for chest pain (x 3 doses). Reported on 05/08/2015   PERCOCET 10-325 MG  tablet Take 1 tablet by mouth 5 (five) times daily as needed.   potassium chloride SA (KLOR-CON) 20 MEQ tablet TAKE ONE TABLET BY MOUTH TWICE DAILY   Semaglutide, 2 MG/DOSE, (OZEMPIC, 2 MG/DOSE,) 8 MG/3ML SOPN Inject 2 mg into the skin once a week.   spironolactone (ALDACTONE) 25 MG tablet Take 1 tablet (25 mg total) by mouth daily.   telmisartan (MICARDIS) 40 MG tablet TAKE ONE TABLET BY MOUTH daily   thiamine 100 MG tablet Take 1 tablet (100 mg total) by mouth daily.   TOUJEO MAX SOLOSTAR 300 UNIT/ML Solostar Pen INJECT 50 UNITS SUBCUTANEOUSLY DAILY   warfarin (COUMADIN) 3 MG tablet TAKE ONE TABLET BY MOUTH AS DIRECTED by clinic   No facility-administered encounter medications on file as of 12/25/2020.    Patient Active Problem List   Diagnosis Date Noted   PAF (paroxysmal atrial fibrillation) ([place])    Poor dentition requiring referral to dentistry 03/26/2020   Thiamine deficiency 12/30/2018   Vitamin B12 deficiency anemia due to intrinsic factor deficiency 12/20/2018   CHF (congestive heart failure), NYHA class I, chronic, combined ([place]) 12/19/2018   Coronary artery disease involving native coronary artery of native heart with angina pectoris ([place]) 12/19/2018   Obesity with body mass index (BMI) of 30.0 to 39.9 01/15/2018   Chronic idiopathic constipation 12/27/2017   Type 2 diabetes mellitus ([place]) 10/30/2017   Eczema 08/07/2017   Insomnia secondary to chronic pain 08/04/2016   Visit for screening mammogram 04/14/2016   LV dysfunction    Routine general medical examination at a health care facility 05/03/2013   DM (diabetes mellitus), type 2 with peripheral vascular complications ([place]) 85/46/2703   PVC's/nonsustained VT 10/24/2012   H/O: CVA (cerebrovascular accident) 02/09/2012   Hypothyroidism 05/27/2011   Fibromyalgia 10/08/2010   Long term current use of anticoagulant 05/05/2010   Low back pain, non-specific 05/12/2008   Cognitive dysfunction associated with depression  01/29/2007   Osteoarthritis 11/22/2006   Hyperlipidemia associated with type 2 diabetes mellitus ([place]) 09/13/2006   Coronary atherosclerosis 09/13/2006   GERD 09/13/2006   Hypertension associated with diabetes ([place]) 08/30/2006    Conditions to be addressed/monitored: Depression; Mental Health Concerns   Care Plan : LCSW Plan of Care  Updates made by Deirdre Peer, LCSW since 12/28/2020 12:00 AM     Problem: Lacks strategy for management of anxiety around sleeping and fear of dying.   Priority: High     Long-Range Goal: Establish and Maintain plan for anxiety management   Start Date: 04/09/2020  Expected End Date: 03/06/2021  This Visit's Progress: On track  Recent Progress: On track  Priority: High  Note:   Current Barriers:  Pt continues to have worry/fear and anxiety. Reports "afraid to go to sleep and die".  CSW encouraged pt to consider counseling support and she is now agreeable to referral being made- she will check with her insurance for copay/cost.  Pt acknowledges grief from the loss of her grandson- appears to be coping in a normal/healthy manner Chronic Mental Health needs related  to anxiety/PTSD - has participated in an initial visit with [facility] clinician and was not set up for follow up. Pt has decided to pause and does not plan on re-scheduling and/or connecting with new mental health therapist at this time. Limited social support, Mental Health Concerns , and Family and relationship dysfunction Suicidal Ideation/Homicidal Ideation: No Pt shares she is a bit overwhelmed by the pile of mail she has to go through and concerns for disposing of the personal documents-she continues to work on preparing them for shredding/trash Clinical Social Work Goal(s):  Over the next 30 days pt will establish with counseling options.  Pt will continue to focus on positive self talk and thought patterns, not over-reacting, and seeking ways to better cope with uncomfortable feelings as  they arise Over the next 90 days, patient will work with SW monthly by telephone to reduce or manage symptoms related to anxiety Interventions: CSW shared with pt she has not heard back from [facility] where she wants to consider for counseling. CSW will attempt to reach out to them also.  Pt has a birthday on [birth year]- birthday wishes offered to pt; she anticipates her sons doing something with her.  Pt shares she had a virtual visit with her Psychiatrist- she is contemplating options for herself in regards to housing,etc. CSW offered encouragement and empowered her to focus on what makes her happy and feeling most safe.   Pt shared with pt she was contacted to schedule the counseling but declined due to concern about the copay- "they wanted me to every other week and I can't afford that. CSW suggested to pt that she consider once monthly and we can contact her Insurance provider to determine what the copay will be- she reports a $30 copay for specialist and thus is afraid it will be that much or more.  Pt continues to struggle with anxiety, fear of dying and is reminded this is where a counselor could work with her to "unwrap" these emotions.    Solution-Focused Strategies, Mindfulness or Psychologist, educational, Active listening / Reflection utilized , Emotional Supportive Provided, Problem Solving /Task Center , Psychoeducation for mental health needs , Motivational Interviewing, and Brief CBT  Patient Coping Strengths:  Spirituality Hopefulness Self Advocate Able to Communicate Effectively [place] and trust with her Psychiatrist and PCP Patient Self Care Deficits:  Lacks social connections Patient Goals:  -reconsider the counseling and maybe once monthly counseling visits - Continue with visits with  Dr Casimiro Needle and further discuss with him about resuming therapy if you feel needed. - practice relaxation or meditation  -continue with positive self talk, "don't sweat the small stuff",  and allowing yourself to be more aware of your emotions and actions that come - reach out to CSW and/or Dr Casimiro Needle if needs arise and want to pursue counseling appointment before scheduled visit with Dr Casimiro Needle  Follow Up Plan: Appointment scheduled for SW follow up with client by phone on:  02/04/21      Follow Up Plan: Appointment scheduled for SW follow up with client by phone on: 02/04/21      Eduard Clos MSW, [place] Licensed Clinical Social Worker [place] [phone]

## 2020-12-31 ENCOUNTER — Telehealth: Payer: Medicare Other

## 2021-01-04 DIAGNOSIS — I5042 Chronic combined systolic (congestive) and diastolic (congestive) heart failure: Secondary | ICD-10-CM | POA: Diagnosis not present

## 2021-01-04 DIAGNOSIS — F419 Anxiety disorder, unspecified: Secondary | ICD-10-CM

## 2021-01-04 DIAGNOSIS — Z794 Long term (current) use of insulin: Secondary | ICD-10-CM | POA: Diagnosis not present

## 2021-01-04 DIAGNOSIS — E1149 Type 2 diabetes mellitus with other diabetic neurological complication: Secondary | ICD-10-CM | POA: Diagnosis not present

## 2021-01-04 DIAGNOSIS — F32A Depression, unspecified: Secondary | ICD-10-CM

## 2021-01-06 ENCOUNTER — Telehealth: Payer: Medicare Other

## 2021-01-07 ENCOUNTER — Ambulatory Visit (INDEPENDENT_AMBULATORY_CARE_PROVIDER_SITE_OTHER): Payer: Medicare Other | Admitting: *Deleted

## 2021-01-07 DIAGNOSIS — I5042 Chronic combined systolic (congestive) and diastolic (congestive) heart failure: Secondary | ICD-10-CM

## 2021-01-07 DIAGNOSIS — Z794 Long term (current) use of insulin: Secondary | ICD-10-CM

## 2021-01-07 DIAGNOSIS — E1149 Type 2 diabetes mellitus with other diabetic neurological complication: Secondary | ICD-10-CM

## 2021-01-07 NOTE — Chronic Care Management (AMB) (Signed)
Chronic Care Management   CCM RN Visit Note  01/07/2021 Name: Kristen [name] MRN: [id number] DOB: [birth year]  Subjective: Kristen [name] is a 75 y.o. year old female who is a primary care patient of [name], MD. The care management team was consulted for assistance with disease management and care coordination needs.    Engaged with patient by telephone for follow up visit in response to provider referral for case management and/or care coordination services.   Consent to Services:  The patient was given information about Chronic Care Management services, agreed to services, and gave verbal consent prior to initiation of services.  Please see initial visit note for detailed documentation.  Patient agreed to services and verbal consent obtained.   Assessment: Review of patient past medical history, allergies, medications, health status, including review of consultants reports, laboratory and other test data, was performed as part of comprehensive evaluation and provision of chronic care management services.   SDOH (Social Determinants of Health) assessments and interventions performed:  SDOH Interventions    Flowsheet Row Most Recent Value  SDOH Interventions   Food Insecurity Interventions Intervention Not Indicated  Financial Strain Interventions Other (Comment)  [Unable to afford dental care- [employer] Care Guide referral placed for same]  Transportation Interventions Intervention Not Indicated  [Husband continues to provide transportation]  Depression Interventions/Treatment  Currently on Treatment  [Patient followed regularly by psychiatric provider- reports she believes her depression is "under control, " last [facility] provider visit: 12/25/20]      CCM Care Plan Allergies  Allergen Reactions   Metformin And Related Other (See Comments)    Must take XR form only, cannot tolerate Regular release metformin   Cleocin [Clindamycin Hcl] Diarrhea   Codeine  Itching   Doxycycline Hyclate Diarrhea   Macrolides And Ketolides Diarrhea   Morphine Hives   Pentazocine Lactate Itching and Nausea And Vomiting    (GENERIC- [place])   Vibramycin [Doxycycline Calcium] Diarrhea and Rash   Clindamycin Diarrhea   Definity [Perflutren Lipid Microsphere] Other (See Comments)    Patient complained of warm sensation in chest x few seconds duration when injected Definity.No other symptoms   Doxycycline Diarrhea   Metformin Other (See Comments)   Pentazocine Nausea Only   Perflutren Other (See Comments)   Perflutren Lipid Microspheres Other (See Comments)   Sulfa Antibiotics Diarrhea and Rash   Sulfonamide Derivatives Rash   Outpatient Encounter Medications as of 01/07/2021  Medication Sig   ALPRAZolam (XANAX) 1 MG tablet 1  qam   1   q3;00 PM  half  qhs   Ascorbic Acid (VITAMIN C) 1000 MG tablet Take 1,000 mg by mouth daily.   atorvastatin (LIPITOR) 40 MG tablet TAKE ONE TABLET BY MOUTH DAILY   Blood Glucose Monitoring Suppl (ONE TOUCH ULTRA 2) w/Device KIT Use TID   Cholecalciferol (VITAMIN D3) 50 MCG (2000 UT) capsule Take 2,000 Units by mouth daily.   cyclobenzaprine (FLEXERIL) 10 MG tablet Take 10 mg by mouth 3 (three) times daily as needed for muscle spasms.   empagliflozin (JARDIANCE) 25 MG TABS tablet Take 1 tablet (25 mg total) by mouth daily. Via [employer] CARES pt assistance   furosemide (LASIX) 20 MG tablet TAKE ONE TABLET BY MOUTH EVERY MORNING   gabapentin (NEURONTIN) 300 MG capsule TAKE ONE CAPSULE BY MOUTH three times daily   glucose blood (COOL BLOOD GLUCOSE TEST STRIPS) test strip Use to test blood sugar TID. DX: E11.9   Insulin Pen Needle (NOVOFINE) 32G X  6 MM MISC 1 Act by Does not apply route daily.   Lancets (ONETOUCH ULTRASOFT) lancets Use as instructed   magnesium oxide (MAG-OX) 400 MG tablet Take 1 tablet (400 mg total) by mouth 2 (two) times daily. WITH BREAKFAST AND EVENING MEALS.   metFORMIN (GLUCOPHAGE-XR) 500 MG 24 hr tablet TAKE TWO  TABLETS BY MOUTH TWICE DAILY WITH food   metoprolol succinate (TOPROL-XL) 100 MG 24 hr tablet TAKE ONE TABLET BY MOUTH TWICE DAILY WITH OR immediately following A meal   Multiple Vitamin (MULTIVITAMIN) tablet Take 1 tablet by mouth daily.   nitroGLYCERIN (NITROSTAT) 0.4 MG SL tablet Place 1 tablet (0.4 mg total) under the tongue every 5 (five) minutes as needed for chest pain (x 3 doses). Reported on 05/08/2015   PERCOCET 10-325 MG tablet Take 1 tablet by mouth 5 (five) times daily as needed.   potassium chloride SA (KLOR-CON) 20 MEQ tablet TAKE ONE TABLET BY MOUTH TWICE DAILY   Semaglutide, 2 MG/DOSE, (OZEMPIC, 2 MG/DOSE,) 8 MG/3ML SOPN Inject 2 mg into the skin once a week.   spironolactone (ALDACTONE) 25 MG tablet Take 1 tablet (25 mg total) by mouth daily.   telmisartan (MICARDIS) 40 MG tablet TAKE ONE TABLET BY MOUTH daily   thiamine 100 MG tablet Take 1 tablet (100 mg total) by mouth daily.   TOUJEO MAX SOLOSTAR 300 UNIT/ML Solostar Pen INJECT 50 UNITS SUBCUTANEOUSLY DAILY   warfarin (COUMADIN) 3 MG tablet TAKE ONE TABLET BY MOUTH AS DIRECTED by clinic   No facility-administered encounter medications on file as of 01/07/2021.   Patient Active Problem List   Diagnosis Date Noted   PAF (paroxysmal atrial fibrillation) ([place])    Poor dentition requiring referral to dentistry 03/26/2020   Thiamine deficiency 12/30/2018   Vitamin B12 deficiency anemia due to intrinsic factor deficiency 12/20/2018   CHF (congestive heart failure), NYHA class I, chronic, combined ([place]) 12/19/2018   Coronary artery disease involving native coronary artery of native heart with angina pectoris ([place]) 12/19/2018   Obesity with body mass index (BMI) of 30.0 to 39.9 01/15/2018   Chronic idiopathic constipation 12/27/2017   Type 2 diabetes mellitus ([place]) 10/30/2017   Eczema 08/07/2017   Insomnia secondary to chronic pain 08/04/2016   Visit for screening mammogram 04/14/2016   LV dysfunction    Routine general  medical examination at a health care facility 05/03/2013   DM (diabetes mellitus), type 2 with peripheral vascular complications ([place]) 68/34/1962   PVC's/nonsustained VT 10/24/2012   H/O: CVA (cerebrovascular accident) 02/09/2012   Hypothyroidism 05/27/2011   Fibromyalgia 10/08/2010   Long term current use of anticoagulant 05/05/2010   Low back pain, non-specific 05/12/2008   Cognitive dysfunction associated with depression 01/29/2007   Osteoarthritis 11/22/2006   Hyperlipidemia associated with type 2 diabetes mellitus ([place]) 09/13/2006   Coronary atherosclerosis 09/13/2006   GERD 09/13/2006   Hypertension associated with diabetes ([place]) 08/30/2006   Conditions to be addressed/monitored:  CHF and DMII  Care Plan : Diabetes Type 2 (Adult)  Updates made by Knox Royalty, RN since 01/07/2021 12:00 AM     Problem: Glycemic Management (Diabetes, Type 2)   Priority: High     Long-Range Goal: Glycemic Management Optimized   Start Date: 05/01/2020  Expected End Date: 10/29/2020  Recent Progress: On track  Priority: High  Note:   Objective:  Lab Results  Component Value Date   HGBA1C 8.0 (A) 03/26/2020   Lab Results  Component Value Date   CREATININE 0.90 10/16/2019  CREATININE 0.86 10/10/2019   CREATININE 0.86 04/11/2019   No results found for: EGFR Current Barriers:  Knowledge Deficits related to basic Diabetes pathophysiology, self care/management, and appropriate diet for self-management of DM Difficulty obtaining or cannot afford medications- confirmed CCM Pharmacist [name] currently active for PAP and medication optimization Unable to independently verbalize appropriate choices for DM soft diet  Does not adhere to provider recommendations re: following low carb/ sugar diet Case Manager Clinical Goal(s):  Over the next 6 months, patient will demonstrate improved adherence to prescribed treatment plan for diabetes self care/management as evidenced by:  daily monitoring and  recording of CBG- on track adherence to ADA/ carb modified diet- on track, continues to require education/ reinforcement  contacting provider for new or worsened symptoms or questions- denies today  01/07/21- Care plan progression: care plan converted to new format, this care plan completed due to duplication of goals; goals re-established and extended today    Care Plan : Heart Failure (Adult)  Updates made by Knox Royalty, RN since 01/07/2021 12:00 AM     Problem: Symptom Exacerbation (Heart Failure)   Priority: Medium     Long-Range Goal: Symptom Exacerbation Prevented or Minimized   Start Date: 05/01/2020  Expected End Date: 10/29/2020  Recent Progress: On track  Priority: Medium  Note:   Current Barriers:  Knowledge deficit related to basic heart failure pathophysiology and self care management- needs ongoing reinforcement Unable to independently verbalize comprehensive signs/ symptoms of HF exacerbation Case Manager Clinical Goal(s):  Over the next 6 months patient will: Verbalize understanding of symptoms associated with Heart Failure and follow Action Plan and when to call doctor- on track Continue monitoring/ recording daily weight daily (notifying MD of 3 lb weight gain over night or 5 lb in a week)- on track  01/07/21- Care plan progression: care plan converted to new format, this care plan completed due to duplication of goals; goals re-established and extended today    Care Plan : [place] of Care  Updates made by Knox Royalty, RN since 01/07/2021 12:00 AM     Problem: Chronic Disease Management Needs   Priority: High     Long-Range Goal: Development of plan of care for long term chronic disease management   Start Date: 01/07/2021  Expected End Date: 01/07/2022  This Visit's Progress: On track  Priority: High  Note:   Current Barriers:  Chronic Disease Management support and education needs related to CHF and DMII Unable to independently  verbalize appropriate choices for DM soft diet- requires ongoing reinforcement / support Does not adhere to provider recommendations re: following low carb/ sugar diet Ongoing dental issues- unable to afford dental care: community resource care guide referral placed for same Chronic pain: followed regularly by pain management provider  RNCM Clinical Goal(s):  Patient will demonstrate Ongoing health management independence CHF; DMII  through collaboration with RN Care manager, provider, and care team.   01/07/21-- care plan conversion with re-establishment and extension patient goals  Interventions: 1:1 collaboration with primary care provider regarding development and update of comprehensive plan of care as evidenced by provider attestation and co-signature Inter-disciplinary care team collaboration (see longitudinal plan of care) Evaluation of current treatment plan related to  self management and patient's adherence to plan as established by provider  Diabetes Interventions: Assessed patient's understanding of A1c goal: <7% Provided education to patient about basic DM disease process; Counseled on importance of regular laboratory monitoring as prescribed; Discussed plans with patient for  ongoing care management follow up and provided patient with direct contact information for care management team; Reviewed scheduled/upcoming provider appointments including: 01/11/21- CCM pharmacy; 01/13/21- pain management provider; 01/14/21- coumadin clinic; 02/04/21- CCM CSW; Referral made to community resources care guide team for assistance with resources for dental care: patient reports can not afford; this prevents her from being able to eat healthy; reports "only have 6 teeth and they are all loose"; Screening for signs and symptoms of depression related to chronic disease state;  Assessed social determinant of health barriers;  Reviewed last PCP office visit with patient 09/21/20- acknowledged that  her A1-C has increased from 8.0 (03/26/20) to 8.6 (09/21/20)- she continues to report she is unable to follow carbohydrate modified, low sugar diet because of her dental issues; confirms today that she did not follow up on the resource for dental care that she previously had-- [place] referral placed Confirmed patient continues monitoring/ recording blood sugars at home, however, she reports she has stopped "checking it as much every day;" she is not clear as to how many times she is actually checking blood sugars at home-- encouraged patient to check/ record at least twice daily: fasting and 2-hours after a meal: reinforced previously provided education around best timing to monitor blood sugars Reviewed with patient recent blood sugars at home: she has no specific values for me today, but reports general fasting ranges between 110-140; post- prandial ranges between 180-200 Reinforced previously provided education around dietary choices, portion control in setting of DMII-- patient requires ongoing reinforcement/ support; continues to report she "knows she eats too many sweets" Confirmed patient has increased weekly Ozempic to 2 mg/ week; reports currently using Toujeo insulin at "48 units"  Lab Results  Component Value Date   HGBA1C 8.6 (H) 09/21/2020  Heart Failure Interventions: Basic overview and discussion of pathophysiology of Heart Failure reviewed; Reviewed Heart Failure Action Plan in depth and provided written copy; Discussed importance of daily weight and advised patient to weigh and record daily; Reviewed role of diuretics in prevention of fluid overload and management of heart failure; Confirmed patient obtained flu vaccine for 2022-23 flu/ winter season Reviewed with patient recent weights at home: she reports weight ranges consistently between 196-198 lbs: positive reinforcement provided: this reports represents weight loss from weights reported in July of  206-208 lbs Reinforced previously provided education around signs/ symptoms yellow CHF zone-- she requires ongoing reinforcement and support; she denies signs/ symptoms today Confirmed patient has pain management provider office visit 01/13/21-- she reports ongoing chronic pain in back Confirmed no recent falls- positive reinforcement provided; confirmed continues consistently using cane  Patient Goals/Self-Care Activities: As evidenced by review of EHR and patient reporting during CCM RN CM outreach, Patient Fraser Din will: Self administer medications as prescribed Attend all scheduled provider appointments Call pharmacy for medication refills Call provider office for new concerns or questions Continue checking blood sugar twice daily and write down these values from home monitoring: check first thing in the morning before you eat and again 2 hours after a large meal Frequently review the information you have been sent about choices of food that are good to eat when you have diabetes, continue your efforts to begin making small changes to your soft diet to decrease you sugar and carbohydrate intake at home Try to follow a heart healthy, low salt, low carbohydrate and low sugar diet Continue to weigh daily and track weight on paper You reported today general weight ranges between 196-198  lbs Call cardiologist/ care providers if I gain more than 2 pounds in one day or 5 pounds in one week Watch for swelling in feet, ankles and legs every day  Follow Up Plan:   Telephone follow up appointment with care management team member scheduled for:  Tuesday, February 16, 2021 at 3:30 pm The patient has been provided with contact information for the care management team and has been advised to call with any health related questions or concerns      Plan: Telephone follow up appointment with care management team member scheduled for:  Tuesday, February 16, 2021 at 3:30 pm The patient has been provided with  contact information for the care management team and has been advised to call with any health related questions or concerns  [name], RN, BSN, [place] [phone]: direct office [phone]: mobile

## 2021-01-07 NOTE — Patient Instructions (Signed)
Visit Information  Kristen [name], it was nice talking with you today.    I look forward to talking to you again for a telephone update on Tuesday, February 16, 2021 at 3:30 pm- please be listening out for my call that day.  I will call as close to 3:30 pm as possible.  As we discussed today, I have asked the [facility] contact you by phone to review possible resources for dental care- please listen for a call form the care guide team   If you need to cancel or re-schedule our telephone visit, please call [phone] and one of our care guides will be happy to assist you.   I look forward to hearing about your progress.   Please don't hesitate to contact me if I can be of assistance to you before our next scheduled telephone appointment.   [name], RN, BSN, [facility] RN Care Coordination- [place] [phone]: direct office [phone]: mobile   Patient self care Activities: Patient Kristen [name] will: Self administer medications as prescribed Attend all scheduled provider appointments Call pharmacy for medication refills Call provider office for new concerns or questions Continue checking blood sugar twice daily and write down these values from home monitoring: check first thing in the morning before you eat and again 2 hours after a large meal Frequently review the information you have been sent about choices of food that are good to eat when you have diabetes, continue your efforts to begin making small changes to your soft diet to decrease you sugar and carbohydrate intake at home Try to follow a heart healthy, low salt, low carbohydrate and low sugar diet Continue to weigh daily and track weight on paper You reported today general weight ranges between 196-198 lbs Call cardiologist/ care providers if I gain more than 2 pounds in one day or 5 pounds in one week Watch for swelling in feet, ankles and legs every day  Diabetes Mellitus and  Standards of [place] with and managing diabetes (diabetes mellitus) can be complicated. Your diabetes treatment may be managed by a team of health care providers, including: A physician who specializes in diabetes (endocrinologist). You might also have visits with a nurse practitioner or physician assistant. Nurses. A registered dietitian. A certified diabetes care and education specialist. An exercise specialist. A pharmacist. An eye doctor. A foot specialist (podiatrist). A dental care provider. A primary care provider. A mental health care provider. How to manage your diabetes You can do many things to successfully manage your diabetes. Your health care providers will follow guidelines to help you get the best quality of care. Here are general guidelines for your diabetes management plan. Your health care providers may give you more specific instructions. Physical exams When you are diagnosed with diabetes, and each year after that, your health care provider will ask about your medical and family history. You will have a physical exam, which may include: Measuring your height, weight, and body mass index (BMI). Checking your blood pressure. This will be done at every routine medical visit. Your target blood pressure may vary depending on your medical conditions, your age, and other factors. A thyroid exam. A skin exam. Screening for nerve damage (peripheral neuropathy). This may include checking the pulse in your legs and feet and the level of sensation in your hands and feet. A foot exam to inspect the structure and skin of your feet, including checking for cuts, bruises, redness, blisters,  sores, or other problems. Screening for blood vessel (vascular) problems. This may include checking the pulse in your legs and feet and checking your temperature. Blood tests Depending on your treatment plan and your personal needs, you may have the following tests: Hemoglobin A1C  (HbA1C). This test provides information about blood sugar (glucose) control over the previous 2-3 months. It is used to adjust your treatment plan, if needed. This test will be done: At least 2 times a year, if you are meeting your treatment goals. 4 times a year, if you are not meeting your treatment goals or if your goals have changed. Lipid testing, including total cholesterol, LDL and HDL cholesterol, and triglyceride levels. The goal for LDL is less than 100 mg/dL (5.5 mmol/L). If you are at high risk for complications, the goal is less than 70 mg/dL (3.9 mmol/L). The goal for HDL is 40 mg/dL (2.2 mmol/L) or higher for men, and 50 mg/dL (2.8 mmol/L) or higher for women. An HDL cholesterol of 60 mg/dL (3.3 mmol/L) or higher gives some protection against heart disease. The goal for triglycerides is less than 150 mg/dL (8.3 mmol/L). Liver function tests. Kidney function tests. Thyroid function tests.  Dental and eye exams  Visit your dentist two times a year. If you have type 1 diabetes, your health care provider may recommend an eye exam within 5 years after you are diagnosed, and then once a year after your first exam. For children with type 1 diabetes, the health care provider may recommend an eye exam when your child is age 12 or older and has had diabetes for 3-5 years. After the first exam, your child should get an eye exam once a year. If you have type 2 diabetes, your health care provider may recommend an eye exam as soon as you are diagnosed, and then every 1-2 years after your first exam. Immunizations A yearly flu (influenza) vaccine is recommended annually for everyone 6 months or older. This is especially important if you have diabetes. The pneumonia (pneumococcal) vaccine is recommended for everyone 2 years or older who has diabetes. If you are age 72 or older, you may get the pneumonia vaccine as a series of two separate shots. The hepatitis B vaccine is recommended for adults  shortly after being diagnosed with diabetes. Adults and children with diabetes should receive all other vaccines according to age-specific recommendations from the Centers for Disease Control and Prevention (CDC). Mental and emotional health Screening for symptoms of eating disorders, anxiety, and depression is recommended at the time of diagnosis and after as needed. If your screening shows that you have symptoms, you may need more evaluation. You may work with a mental health care provider. Follow these instructions at home: Treatment plan You will monitor your blood glucose levels and may give yourself insulin. Your treatment plan will be reviewed at every medical visit. You and your health care provider will discuss: How you are taking your medicines, including insulin. Any side effects you have. Your blood glucose level target goals. How often you monitor your blood glucose level. Lifestyle habits, such as activity level and tobacco, alcohol, and substance use. Education Your health care provider will assess how well you are monitoring your blood glucose levels and whether you are taking your insulin and medicines correctly. He or she may refer you to: A certified diabetes care and education specialist to manage your diabetes throughout your life, starting at diagnosis. A registered dietitian who can create and review your personal  nutrition plan. An exercise specialist who can discuss your activity level and exercise plan. General instructions Take over-the-counter and prescription medicines only as told by your health care provider. Keep all follow-up visits. This is important. Where to find support There are many diabetes support networks, including: American Diabetes Association (ADA): diabetes.org Defeat Diabetes Foundation: defeatdiabetes.org Where to find more information American Diabetes Association (ADA): www.diabetes.org Association of Diabetes Care & Education Specialists  (ADCES): diabeteseducator.org International Diabetes Federation (IDF): https://www.munoz-bell.org/ Summary Managing diabetes (diabetes mellitus) can be complicated. Your diabetes treatment may be managed by a team of health care providers. Your health care providers follow guidelines to help you get the best quality care. You should have physical exams, blood tests, blood pressure monitoring, immunizations, and screening tests regularly. Stay updated on how to manage your diabetes. Your health care providers may also give you more specific instructions based on your individual health. This information is not intended to replace advice given to you by your health care provider. Make sure you discuss any questions you have with your health care provider. Document Revised: 08/29/2019 Document Reviewed: 08/29/2019 Elsevier Patient Education  2022 [place].   Patient verbalizes understanding of instructions provided today and agrees to view in MyChart  Telephone follow up appointment with care management team member scheduled for:  Tuesday, February 16, 2021 at 3:30 pm The patient has been provided with contact information for the care management team and has been advised to call with any health related questions or concerns  [name], RN, BSN, [facility] [phone]: direct office [phone]: mobile

## 2021-01-11 ENCOUNTER — Other Ambulatory Visit: Payer: Self-pay

## 2021-01-11 ENCOUNTER — Ambulatory Visit: Payer: Medicare Other | Admitting: Pharmacist

## 2021-01-11 DIAGNOSIS — E1149 Type 2 diabetes mellitus with other diabetic neurological complication: Secondary | ICD-10-CM

## 2021-01-11 DIAGNOSIS — I25119 Atherosclerotic heart disease of native coronary artery with unspecified angina pectoris: Secondary | ICD-10-CM

## 2021-01-11 DIAGNOSIS — I152 Hypertension secondary to endocrine disorders: Secondary | ICD-10-CM

## 2021-01-11 DIAGNOSIS — I5042 Chronic combined systolic (congestive) and diastolic (congestive) heart failure: Secondary | ICD-10-CM

## 2021-01-11 DIAGNOSIS — E1169 Type 2 diabetes mellitus with other specified complication: Secondary | ICD-10-CM

## 2021-01-11 DIAGNOSIS — Z794 Long term (current) use of insulin: Secondary | ICD-10-CM

## 2021-01-11 DIAGNOSIS — E1159 Type 2 diabetes mellitus with other circulatory complications: Secondary | ICD-10-CM

## 2021-01-11 DIAGNOSIS — I480 Paroxysmal atrial fibrillation: Secondary | ICD-10-CM

## 2021-01-11 NOTE — Progress Notes (Signed)
Chronic Care Management Pharmacy Note  01/14/2021 Name:  Sanvika Cuttino MRN:  [id number] DOB:  March 11, 1945  Summary: -Pt reports she forgets insulin dose fairly often due to taking it at night separate from other meds  Recommendations/Changes made from today's visit: -Advised to move Toujeo to morning with other meds to improve compliance   Subjective: Lutisha Knoche is an 75 y.o. year old female who is a primary patient of [name], MD.  The CCM team was consulted for assistance with disease management and care coordination needs.    Engaged with patient by telephone for follow up visit in response to provider referral for pharmacy case management and/or care coordination services.   Consent to Services:  The patient was given information about Chronic Care Management services, agreed to services, and gave verbal consent prior to initiation of services.  Please see initial visit note for detailed documentation.   Patient Care Team: [name], MD as PCP - General (Internal Medicine) Sherren Mocha, MD as PCP - Cardiology (Cardiology) Deboraha Sprang, MD as Consulting Physician (Cardiology) Charlton Haws, [facility] as Pharmacist (Pharmacist) Shon Hough, MD as Consulting Physician (Ophthalmology) Deirdre Peer, LCSW as Social Worker (Licensed Clinical Social Worker) Knox Royalty, RN as Case Manager  Recent office visits: 09/21/20 Dr Ronnald Ramp OV: chronic f/u; A1c 8.6. Increase Ozempic to 2 mg. F/U 6 months.  Recent consult visits: 12/22/20 Dr Sheran Spine (psych): start reducing xanax - 1 mg AM, 1 mg 3pm, 0.5 mg HS. F/U 2.5 months.  08/18/20 Dr Casimiro Needle VV: no changes  Hospital visits: None in previous 6 months   Objective:  Lab Results  Component Value Date   CREATININE 0.83 09/21/2020   BUN 19 09/21/2020   GFR 70.27 09/21/2020   GFRNONAA 64 10/16/2019   GFRAA 75 10/16/2019   NA 137 09/21/2020   K 4.3 09/21/2020   CALCIUM 10.1  09/21/2020   CO2 22 09/21/2020   GLUCOSE 131 (H) 09/21/2020    Lab Results  Component Value Date/Time   HGBA1C 8.6 (H) 09/21/2020 02:25 PM   HGBA1C 8.0 (A) 03/26/2020 02:32 PM   HGBA1C 8.3 (H) 10/10/2019 03:46 PM   GFR 70.27 09/21/2020 02:25 PM   GFR 78.64 04/11/2019 02:55 PM   MICROALBUR <0.7 09/21/2020 02:25 PM   MICROALBUR <0.7 04/11/2019 02:55 PM    Last diabetic Eye exam:  Lab Results  Component Value Date/Time   HMDIABEYEEXA No Retinopathy 03/21/2019 12:00 AM    Last diabetic Foot exam:  Lab Results  Component Value Date/Time   HMDIABFOOTEX normal 04/02/2014 12:00 AM     Lab Results  Component Value Date   CHOL 98 09/21/2020   HDL 34.60 (L) 09/21/2020   LDLCALC 39 09/21/2020   LDLDIRECT 116.0 11/28/2008   TRIG 126.0 09/21/2020   CHOLHDL 3 09/21/2020    Hepatic Function Latest Ref Rng & Units 09/21/2020 04/11/2019 12/19/2018  Total Protein 6.0 - 8.3 g/dL 8.0 8.0 7.8  Albumin 3.5 - 5.2 g/dL 4.5 4.4 4.9  AST 0 - 37 U/L $Remo'18 12 15  'svsvH$ ALT 0 - 35 U/L $Remo'14 13 12  'hkmDp$ Alk Phosphatase 39 - 117 U/L 50 53 47  Total Bilirubin 0.2 - 1.2 mg/dL 0.5 0.4 1.5(H)  Bilirubin, Direct 0.0 - 0.3 mg/dL 0.1 0.1 0.3    Lab Results  Component Value Date/Time   TSH 2.26 09/21/2020 02:25 PM   TSH 1.55 10/10/2019 03:46 PM   FREET4 0.73 09/27/2013 01:28 PM   FREET4 0.68  05/07/2012 09:44 AM    CBC Latest Ref Rng & Units 09/21/2020 10/10/2019 04/11/2019  WBC 4.0 - 10.5 K/uL 6.0 6.0 6.5  Hemoglobin 12.0 - 15.0 g/dL 12.6 12.2 13.0  Hematocrit 36.0 - 46.0 % 39.4 39.8 41.3  Platelets 150.0 - 400.0 K/uL 238.0 308 398.0    No results found for: VD25OH  Clinical ASCVD: Yes  The ASCVD Risk score (Arnett DK, et al., 2019) failed to calculate for the following reasons:   The valid total cholesterol range is 130 to 320 mg/dL    Depression screen [facility] 2/9 01/07/2021 06/03/2020 04/09/2020  Decreased Interest 1 2 1   Down, Depressed, Hopeless 0 0 1  PHQ - 2 Score 1 2 2   Altered sleeping - 2 2  Tired,  decreased energy - - 1  Change in appetite - 1 1  Feeling bad or failure about yourself  - 0 1  Trouble concentrating - 1 1  Moving slowly or fidgety/restless - 1 1  Suicidal thoughts - 0 0  PHQ-9 Score - 7 9  Difficult doing work/chores - Not difficult at all Somewhat difficult  Some recent data might be hidden     Social History   Tobacco Use  Smoking Status Former   Types: Cigarettes   Quit date: 03/07/2001   Years since quitting: 19.8  Smokeless Tobacco Never   BP Readings from Last 3 Encounters:  09/21/20 132/74  05/13/20 110/72  03/26/20 138/70   Pulse Readings from Last 3 Encounters:  09/21/20 86  05/13/20 96  03/26/20 98   Wt Readings from Last 3 Encounters:  09/21/20 201 lb (91.2 kg)  05/13/20 208 lb 6.4 oz (94.5 kg)  03/26/20 212 lb (96.2 kg)   BMI Readings from Last 3 Encounters:  09/21/20 37.98 kg/m  05/13/20 39.38 kg/m  03/26/20 40.06 kg/m    Assessment/Interventions: Review of patient past medical history, allergies, medications, health status, including review of consultants reports, laboratory and other test data, was performed as part of comprehensive evaluation and provision of chronic care management services.   SDOH:  (Social Determinants of Health) assessments and interventions performed: Yes  SDOH Screenings   Alcohol Screen: Not on file  Depression (PHQ2-9): Low Risk    PHQ-2 Score: 1  Financial Resource Strain: High Risk   Difficulty of Paying Living Expenses: Hard  Food Insecurity: No Food Insecurity   Worried About Charity fundraiser in the Last Year: Never true   Ran Out of Food in the Last Year: Never true  Housing: Not on file  Physical Activity: Not on file  Social Connections: Not on file  Stress: Stress Concern Present   Feeling of Stress : To some extent  Tobacco Use: Medium Risk   Smoking Tobacco Use: Former   Smokeless Tobacco Use: Never   Passive Exposure: Not on file  Transportation Needs: No Transportation Needs    Lack of Transportation (Medical): No   Lack of Transportation (Non-Medical): No    CCM Care Plan  Allergies  Allergen Reactions   Metformin And Related Other (See Comments)    Must take XR form only, cannot tolerate Regular release metformin   Cleocin [Clindamycin Hcl] Diarrhea   Codeine Itching   Doxycycline Hyclate Diarrhea   Macrolides And Ketolides Diarrhea   Morphine Hives   Pentazocine Lactate Itching and Nausea And Vomiting    (GENERIC- [place])   Vibramycin [Doxycycline Calcium] Diarrhea and Rash   Clindamycin Diarrhea   Definity [Perflutren Lipid Microsphere] Other (See Comments)  Patient complained of warm sensation in chest x few seconds duration when injected Definity.No other symptoms   Doxycycline Diarrhea   Metformin Other (See Comments)   Pentazocine Nausea Only   Perflutren Other (See Comments)   Perflutren Lipid Microspheres Other (See Comments)   Sulfa Antibiotics Diarrhea and Rash   Sulfonamide Derivatives Rash    Medications Reviewed Today     Reviewed by Charlton Haws, [facility] (Pharmacist) on 01/13/21 at [place] List Status: <None>   Medication Order Taking? Sig Documenting Provider Last Dose Status Informant  ALPRAZolam (XANAX) 1 MG tablet [id number] Yes 1  qam   1   q3;00 PM  half  qhs Plovsky, Berneta Sages, MD Taking Active   Ascorbic Acid (VITAMIN C) 1000 MG tablet [id number] Yes Take 1,000 mg by mouth daily. [provider] Taking Active   atorvastatin (LIPITOR) 40 MG tablet [id number] Yes TAKE ONE TABLET BY MOUTH DAILY [name], MD Taking Active   Blood Glucose Monitoring Suppl (ONE TOUCH ULTRA 2) w/Device KIT [id number] Yes Use TID [name], MD Taking Active   Cholecalciferol (VITAMIN D3) 50 MCG (2000 UT) capsule [id number] Yes Take 2,000 Units by mouth daily. [provider] Taking Active   cyclobenzaprine (FLEXERIL) 10 MG tablet [id number] Yes Take 10 mg by mouth 3 (three) times daily as needed for muscle spasms.  [provider] Taking Active Self  empagliflozin (JARDIANCE) 25 MG TABS tablet [id number] Yes Take 1 tablet (25 mg total) by mouth daily. Via [employer] pt assistance [name], MD Taking Active   furosemide (LASIX) 20 MG tablet [id number] Yes TAKE ONE TABLET BY MOUTH EVERY MORNING [name], MD Taking Active   gabapentin (NEURONTIN) 300 MG capsule [id number] Yes TAKE ONE CAPSULE BY MOUTH three times daily [name], MD Taking Active   glucose blood (COOL BLOOD GLUCOSE TEST STRIPS) test strip [id number] Yes Use to test blood sugar TID. DX: E11.9 [name], MD Taking Active   Insulin Pen Needle (NOVOFINE) 32G X 6 MM MISC [id number] Yes 1 Act by Does not apply route daily. [name], MD Taking Active   Lancets [facility] ULTRASOFT) lancets [id number] Yes Use as instructed [name], MD Taking Active   magnesium oxide (MAG-OX) 400 MG tablet [id number] Yes Take 1 tablet (400 mg total) by mouth 2 (two) times daily. WITH BREAKFAST AND EVENING MEALS. Sherren Mocha, MD Taking Active   metFORMIN (GLUCOPHAGE-XR) 500 MG 24 hr tablet [id number] Yes TAKE TWO TABLETS BY MOUTH TWICE DAILY WITH food [name], MD Taking Active   metoprolol succinate (TOPROL-XL) 100 MG 24 hr tablet [id number] Yes TAKE ONE TABLET BY MOUTH TWICE DAILY WITH OR immediately following A meal Sherren Mocha, MD Taking Active   Multiple Vitamin (MULTIVITAMIN) tablet [id number] Yes Take 1 tablet by mouth daily. [provider] Taking Active   nitroGLYCERIN (NITROSTAT) 0.4 MG SL tablet [id number] Yes Place 1 tablet (0.4 mg total) under the tongue every 5 (five) minutes as needed for chest pain (x 3 doses). Reported on 05/08/2015 Sherren Mocha, MD Taking Active   PERCOCET 10-325 MG tablet [id number] Yes Take 1 tablet by mouth 5 (five) times daily as needed. [provider] Taking Active   potassium chloride SA (KLOR-CON) 20 MEQ tablet [id number] Yes TAKE ONE TABLET BY MOUTH TWICE DAILY  Sherren Mocha, MD Taking Active   Semaglutide, 2 MG/DOSE, (OZEMPIC, 2 MG/DOSE,) 8 MG/3ML SOPN [id number] Yes Inject 2 mg into the skin once a  week. [name], MD Taking Active   spironolactone (ALDACTONE) 25 MG tablet [id number] Yes Take 1 tablet (25 mg total) by mouth daily. [name], MD Taking Active   telmisartan (MICARDIS) 40 MG tablet [id number] Yes TAKE ONE TABLET BY MOUTH daily [name], MD Taking Active   thiamine 100 MG tablet [id number] Yes Take 1 tablet (100 mg total) by mouth daily. [name], MD Taking Active   TOUJEO MAX SOLOSTAR 300 UNIT/ML Solostar Pen [id number] Yes INJECT 80 UNITS SUBCUTANEOUSLY DAILY [name], MD Taking Active   warfarin (COUMADIN) 3 MG tablet [id number] Yes TAKE ONE TABLET BY MOUTH AS DIRECTED by clinic [name], MD Taking Active             Patient Active Problem List   Diagnosis Date Noted   PAF (paroxysmal atrial fibrillation) ([place])    Poor dentition requiring referral to dentistry 03/26/2020   Thiamine deficiency 12/30/2018   Vitamin B12 deficiency anemia due to intrinsic factor deficiency 12/20/2018   CHF (congestive heart failure), NYHA class I, chronic, combined ([place]) 12/19/2018   Coronary artery disease involving native coronary artery of native heart with angina pectoris ([place]) 12/19/2018   Obesity with body mass index (BMI) of 30.0 to 39.9 01/15/2018   Chronic idiopathic constipation 12/27/2017   Type 2 diabetes mellitus ([place]) 10/30/2017   Eczema 08/07/2017   Insomnia secondary to chronic pain 08/04/2016   Visit for screening mammogram 04/14/2016   LV dysfunction    Routine general medical examination at a health care facility 05/03/2013   DM (diabetes mellitus), type 2 with peripheral vascular complications ([place]) 97/74/1423   PVC's/nonsustained VT 10/24/2012   H/O: CVA (cerebrovascular accident) 02/09/2012   Hypothyroidism 05/27/2011   Fibromyalgia 10/08/2010   Long term current use of anticoagulant  05/05/2010   Low back pain, non-specific 05/12/2008   Cognitive dysfunction associated with depression 01/29/2007   Osteoarthritis 11/22/2006   Hyperlipidemia associated with type 2 diabetes mellitus ([place]) 09/13/2006   Coronary atherosclerosis 09/13/2006   GERD 09/13/2006   Hypertension associated with diabetes ([place]) 08/30/2006    Immunization History  Administered Date(s) Administered   Fluad Quad(high Dose 65+) 11/16/2018, 12/10/2019, 12/10/2020   Influenza Split 12/14/2010, 12/12/2011   Influenza Whole 01/05/2006, 01/29/2007, 12/26/2007, 11/28/2008, 12/04/2009   Influenza, High Dose Seasonal PF 11/12/2014, 01/26/2016, 11/25/2016, 12/27/2017   Influenza,inj,Quad PF,6+ Mos 11/12/2012, 04/08/2014   Moderna Sars-Covid-2 Vaccination 04/20/2019, 06/03/2019   Pneumococcal Conjugate-13 08/09/2013   Pneumococcal Polysaccharide-23 12/04/2009, 12/14/2010, 10/12/2016   Td 03/07/2005   Tdap 04/14/2016    Conditions to be addressed/monitored:  Hypertension, Hyperlipidemia, Diabetes, Atrial Fibrillation, Heart Failure, and Coronary Artery Disease  Care Plan : [place]  Updates made by Charlton Haws, [employer] since 01/14/2021 12:00 AM     Problem: DM, HTN, HLD, CAD, CHF   Priority: High     Long-Range Goal: Disease management   Start Date: 04/07/2020  Expected End Date: 04/07/2021  This Visit's Progress: Not on track  Recent Progress: On track  Priority: High  Note:   Current Barriers:  Unable to independently afford treatment regimen Unable to independently monitor therapeutic efficacy Unable to achieve control of Diabetes   Pharmacist Clinical Goal(s):  Patient will verbalize ability to afford treatment regimen achieve adherence to monitoring guidelines and medication adherence to achieve therapeutic efficacy achieve control of diabetes as evidenced by improvement in A1c through collaboration with PharmD and provider.   Interventions: 1:1 collaboration with  [name],  MD regarding development and update of comprehensive plan of care as evidenced by provider attestation and co-signature Inter-disciplinary care team collaboration (see longitudinal plan of care) Comprehensive medication review performed; medication list updated in electronic medical record  Hypertension / Heart Failure (BP goal <130/80) -Controlled - BP is at goal; pt endorses compliance and denies issues -Current home readings: 117/76 -HF Type: Combined Systolic and Diastolic -NYHA Class: II (slight limitation of activity) -Ejection fraction: 50-55% (Date: 09/02/2014) -Current treatment: Metoprolol succinate 100 mg twice daily  Telmisartan 40 mg daily Spironolactone 25 mg daily Furosemide 20 mg daily Potassium chloride 20 meq twice daily Nitroglycerin 0.4 mg as needed -Denies hypotensive/hypertensive symptoms -Counseled to monitor BP at home twice a week -Recommend to continue current medication  Hyperlipidemia: (LDL goal < 70) -Controlled - LDL is at goal; pt endorses compliance and denies side effects -Current treatment: Atorvastatin 40 mg daily -Educated on Cholesterol goals -Recommended to continue current medication  Diabetes (A1c goal <8%) -Uncontrolled - pt reports she forgets her insulin dose fairly often since she takes it at bedtime separate from other meds -Current home glucose readings fasting glucose: 140-160 -Current medications: Jardiance 25 mg daily (via [employer]) Metformin ER 1000 mg twice daily Toujeo Max 50 units daily HS Ozempic 2 mg weekly (via [employer]) -Denies hypoglycemic/hyperglycemic symptoms -Educated on A1c and blood sugar goals; Prevention and management of hypoglycemic episodes; carb counting -Advised to limit carbs to <50g per meal and <15g per snack -Recommend to move Toujeo to morning with other meds  Atrial Fibrillation (Goal: prevent stroke and major bleeding) -Controlled - pt reports occasional HR to 110s but reverts  back to baseline 90s quickly -CHADSVASC: 8 -Current treatment: Rate control: Metoprolol succinate 100 mg twice a day Anticoagulation: Warfarin 3 mg as directed -Counseled on increased risk of stroke due to Afib and benefits of anticoagulation for stroke prevention; importance of adherence to anticoagulant exactly as prescribed; -Recommended to continue current medication  Patient Goals/Self-Care Activities Patient will:  - take medications as prescribed -focus on medication adherence by pill box -check glucose daily -check blood pressure daily -Limit carbs to <50 g per meal and <15 g per snack -Move Toujeo to morning      Medication Assistance:  [employer] - approved through 02/03/21 Jardiance - [employer] - approved through 03/06/21  Compliance/Adherence/Medication fill history: Eye exam (03/20/20)  Star-Rating Drugs: Telmisartan - LF 11/11/20 x 90 ds Metformin - LF 12/07/20 x 90 ds Atorvastatin - LF 12/07/20 x 90 ds  Patient's preferred pharmacy is:  [facility] - [place], [place] - [address] [place] [address] Phone: [phone] Fax: [phone]  Uses pill box? Yes Pt endorses 100% compliance  We discussed: Reviewed patient's [facility] medication and Epic medication profile assuring there are no discrepancies or gaps in therapy. Confirmed all fill dates appropriate and verified with patient that there is a sufficient quantity of all prescribed medications at home. Informed patient to call me any time if needing medications before scheduled deliveries.  -Pt requested refill for Potassium, delivery 01/25/21. Coordinated with pharmacy to schedule refill.  Patient decided to: Utilize [facility] pharmacy for medication synchronization, packaging and delivery  Care Plan and Follow Up Patient Decision:  Patient agrees to Care Plan and Follow-up.  Plan: Telephone follow up appointment with care management  team member scheduled for:  3 months  Charlene Brooke, PharmD, BCACP, CPP Clinical Pharmacist Practitioner [facility] at [facility]  [address] [phone]

## 2021-01-13 DIAGNOSIS — M47816 Spondylosis without myelopathy or radiculopathy, lumbar region: Secondary | ICD-10-CM | POA: Diagnosis not present

## 2021-01-13 DIAGNOSIS — G894 Chronic pain syndrome: Secondary | ICD-10-CM | POA: Diagnosis not present

## 2021-01-13 DIAGNOSIS — M150 Primary generalized (osteo)arthritis: Secondary | ICD-10-CM | POA: Diagnosis not present

## 2021-01-13 DIAGNOSIS — Z79891 Long term (current) use of opiate analgesic: Secondary | ICD-10-CM | POA: Diagnosis not present

## 2021-01-14 ENCOUNTER — Ambulatory Visit: Payer: Medicare Other

## 2021-01-14 NOTE — Patient Instructions (Addendum)
Visit Information  Phone number for Pharmacist: [phone]   Goals Addressed             This Visit's Progress    Manage My Medicine       Timeframe:  Long-Range Goal Priority:  High Start Date:      07/06/20                       Expected End Date:   01/11/22                 Follow Up Date Feb 2023   - call for medicine refill 2 or 3 days before it runs out - call if I am sick and can't take my medicine - keep a list of all the medicines I take; vitamins and herbals too  -check glucose daily, document, and provide at future appointments -check blood pressure daily, document, and provide at future appointments -Limit carbs to <50 g per meal and <15 g per snack -Move Toujeo to AM to help with compliance    Why is this important?   These steps will help you keep on track with your medicines.   Notes:         Care Plan : [place]  Updates made by Charlton Haws, RPH since 01/14/2021 12:00 AM     Problem: DM, HTN, HLD, CAD, CHF   Priority: High     Long-Range Goal: Disease management   Start Date: 04/07/2020  Expected End Date: 04/07/2021  This Visit's Progress: Not on track  Recent Progress: On track  Priority: High  Note:   Current Barriers:  Unable to independently afford treatment regimen Unable to independently monitor therapeutic efficacy Unable to achieve control of Diabetes   Pharmacist Clinical Goal(s):  Patient will verbalize ability to afford treatment regimen achieve adherence to monitoring guidelines and medication adherence to achieve therapeutic efficacy achieve control of diabetes as evidenced by improvement in A1c through collaboration with PharmD and provider.   Interventions: 1:1 collaboration with [name], MD regarding development and update of comprehensive plan of care as evidenced by provider attestation and co-signature Inter-disciplinary care team collaboration (see longitudinal plan of care) Comprehensive  medication review performed; medication list updated in electronic medical record  Hypertension / Heart Failure (BP goal <130/80) -Controlled - BP is at goal; pt endorses compliance and denies issues -Current home readings: 117/76 -HF Type: Combined Systolic and Diastolic -NYHA Class: II (slight limitation of activity) -Ejection fraction: 50-55% (Date: 09/02/2014) -Current treatment: Metoprolol succinate 100 mg twice daily  Telmisartan 40 mg daily Spironolactone 25 mg daily Furosemide 20 mg daily Potassium chloride 20 meq twice daily Nitroglycerin 0.4 mg as needed -Denies hypotensive/hypertensive symptoms -Counseled to monitor BP at home twice a week -Recommend to continue current medication  Hyperlipidemia: (LDL goal < 70) -Controlled - LDL is at goal; pt endorses compliance and denies side effects -Current treatment: Atorvastatin 40 mg daily -Educated on Cholesterol goals -Recommended to continue current medication  Diabetes (A1c goal <8%) -Uncontrolled - pt reports she forgets her insulin dose fairly often since she takes it at bedtime separate from other meds -Current home glucose readings fasting glucose: 140-160 -Current medications: Jardiance 25 mg daily (via [employer]) Metformin ER 1000 mg twice daily Toujeo Max 50 units daily HS Ozempic 2 mg weekly (via [employer]) -Denies hypoglycemic/hyperglycemic symptoms -Educated on A1c and blood sugar goals; Prevention and management of hypoglycemic episodes; carb counting -Advised to  limit carbs to <50g per meal and <15g per snack -Recommend to move Toujeo to morning with other meds  Atrial Fibrillation (Goal: prevent stroke and major bleeding) -Controlled - pt reports occasional HR to 110s but reverts back to baseline 90s quickly -CHADSVASC: 8 -Current treatment: Rate control: Metoprolol succinate 100 mg twice a day Anticoagulation: Warfarin 3 mg as directed -Counseled on increased risk of stroke due to Afib and benefits  of anticoagulation for stroke prevention; importance of adherence to anticoagulant exactly as prescribed; -Recommended to continue current medication  Patient Goals/Self-Care Activities Patient will:  - take medications as prescribed -focus on medication adherence by pill box -check glucose daily -check blood pressure daily -Limit carbs to <50 g per meal and <15 g per snack -Move Toujeo to morning      Patient verbalizes understanding of instructions provided today and agrees to view in [place].  Telephone follow up appointment with pharmacy team member scheduled for: 3 months  Charlene Brooke, PharmD, Para [place], CPP Clinical Pharmacist Practitioner [facility] at [facility] [phone]

## 2021-01-19 ENCOUNTER — Other Ambulatory Visit: Payer: Self-pay

## 2021-01-19 ENCOUNTER — Ambulatory Visit: Payer: Medicare Other

## 2021-01-19 ENCOUNTER — Ambulatory Visit (INDEPENDENT_AMBULATORY_CARE_PROVIDER_SITE_OTHER): Payer: Medicare Other

## 2021-01-19 DIAGNOSIS — Z7901 Long term (current) use of anticoagulants: Secondary | ICD-10-CM | POA: Diagnosis not present

## 2021-01-19 DIAGNOSIS — D510 Vitamin B12 deficiency anemia due to intrinsic factor deficiency: Secondary | ICD-10-CM | POA: Diagnosis not present

## 2021-01-19 LAB — POCT INR: INR: 1.8 — AB (ref 2.0–3.0)

## 2021-01-19 MED ORDER — CYANOCOBALAMIN 1000 MCG/ML IJ SOLN
1000.0000 ug | Freq: Once | INTRAMUSCULAR | Status: AC
  Administered 2021-01-19: 1000 ug via INTRAMUSCULAR

## 2021-01-19 NOTE — Patient Instructions (Addendum)
Pre visit review using our clinic review tool, if applicable. No additional management support is needed unless otherwise documented below in the visit note.  Take 1 1/2 tablets today and then continue 1 tablet daily except take 1/2 tablet on Wednesdays. Re-check in 4 weeks.

## 2021-01-19 NOTE — Progress Notes (Signed)
Take 1 1/2 tablets today and then continue 1 tablet daily except take 1/2 tablet on Wednesdays. Re-check in 4 weeks.   Per orders of Dr. Ronnald Ramp, injection of B12 given in L deltoid by Randall An. Patient tolerated injection well.

## 2021-01-20 ENCOUNTER — Telehealth: Payer: Self-pay

## 2021-01-20 NOTE — Progress Notes (Signed)
    Chronic Care Management Pharmacy Assistant   Name: Kristen [name]  MRN: [id number] DOB: March 30, 1945  Patient is currently enrolled in [place] patient assistance program for the medication Rybelsus until date: 03/06/21 Patient would like to re-enroll in patient assistance for the next calendar year.  Renewal forms were completed on behalf of the patient. Patient has requested to receive forms in the mail to complete. Forms will be printed and mailed to patient.Left message to return call  [place] Pharmacist Assistant [phone]

## 2021-01-25 ENCOUNTER — Other Ambulatory Visit: Payer: Self-pay | Admitting: Internal Medicine

## 2021-01-25 ENCOUNTER — Ambulatory Visit (INDEPENDENT_AMBULATORY_CARE_PROVIDER_SITE_OTHER): Payer: Medicare Other

## 2021-01-25 DIAGNOSIS — Z1231 Encounter for screening mammogram for malignant neoplasm of breast: Secondary | ICD-10-CM | POA: Diagnosis not present

## 2021-01-25 DIAGNOSIS — Z0000 Encounter for general adult medical examination without abnormal findings: Secondary | ICD-10-CM | POA: Diagnosis not present

## 2021-01-25 DIAGNOSIS — E1151 Type 2 diabetes mellitus with diabetic peripheral angiopathy without gangrene: Secondary | ICD-10-CM

## 2021-01-25 NOTE — Patient Instructions (Signed)
[name] , Thank you for taking time to come for your Medicare Wellness Visit. I appreciate your ongoing commitment to your health goals. Please review the following plan we discussed and let me know if I can assist you in the future.   Screening recommendations/referrals: Colonoscopy: 03/20/2012  due 2024 Mammogram: referral 01/25/2021 Bone Density: 11/22/2016 Recommended yearly ophthalmology/optometry visit for glaucoma screening and checkup Recommended yearly dental visit for hygiene and checkup  Vaccinations: Influenza vaccine: completed  Pneumococcal vaccine: completed  Tdap vaccine: 04/14/2016 Shingles vaccine: will consider     Advanced directives: none   Conditions/risks identified: none   Next appointment: none    Preventive Care 73 Years and Older, Female Preventive care refers to lifestyle choices and visits with your health care provider that can promote health and wellness. What does preventive care include? A yearly physical exam. This is also called an annual well check. Dental exams once or twice a year. Routine eye exams. Ask your health care provider how often you should have your eyes checked. Personal lifestyle choices, including: Daily care of your teeth and gums. Regular physical activity. Eating a healthy diet. Avoiding tobacco and drug use. Limiting alcohol use. Practicing safe sex. Taking low-dose aspirin every day. Taking vitamin and mineral supplements as recommended by your health care provider. What happens during an annual well check? The services and screenings done by your health care provider during your annual well check will depend on your age, overall health, lifestyle risk factors, and family history of disease. Counseling  Your health care provider may ask you questions about your: Alcohol use. Tobacco use. Drug use. Emotional well-being. Home and relationship well-being. Sexual activity. Eating habits. History of  falls. Memory and ability to understand (cognition). Work and work Statistician. Reproductive health. Screening  You may have the following tests or measurements: Height, weight, and BMI. Blood pressure. Lipid and cholesterol levels. These may be checked every 5 years, or more frequently if you are over 36 years old. Skin check. Lung cancer screening. You may have this screening every year starting at age 64 if you have a 30-pack-year history of smoking and currently smoke or have quit within the past 15 years. Fecal occult blood test (FOBT) of the stool. You may have this test every year starting at age 47. Flexible sigmoidoscopy or colonoscopy. You may have a sigmoidoscopy every 5 years or a colonoscopy every 10 years starting at age 45. Hepatitis C blood test. Hepatitis B blood test. Sexually transmitted disease (STD) testing. Diabetes screening. This is done by checking your blood sugar (glucose) after you have not eaten for a while (fasting). You may have this done every 1-3 years. Bone density scan. This is done to screen for osteoporosis. You may have this done starting at age 74. Mammogram. This may be done every 1-2 years. Talk to your health care provider about how often you should have regular mammograms. Talk with your health care provider about your test results, treatment options, and if necessary, the need for more tests. Vaccines  Your health care provider may recommend certain vaccines, such as: Influenza vaccine. This is recommended every year. Tetanus, diphtheria, and acellular pertussis (Tdap, Td) vaccine. You may need a Td booster every 10 years. Zoster vaccine. You may need this after age 51. Pneumococcal 13-valent conjugate (PCV13) vaccine. One dose is recommended after age 22. Pneumococcal polysaccharide (PPSV23) vaccine. One dose is recommended after age 5. Talk to your health care provider about which screenings and vaccines  you need and how often you need  them. This information is not intended to replace advice given to you by your health care provider. Make sure you discuss any questions you have with your health care provider. Document Released: 03/20/2015 Document Revised: 11/11/2015 Document Reviewed: 12/23/2014 Elsevier Interactive Patient Education  2017 [place] Prevention in the Home Falls can cause injuries. They can happen to people of all ages. There are many things you can do to make your home safe and to help prevent falls. What can I do on the outside of my home? Regularly fix the edges of walkways and driveways and fix any cracks. Remove anything that might make you trip as you walk through a door, such as a raised step or threshold. Trim any bushes or trees on the path to your home. Use bright outdoor lighting. Clear any walking paths of anything that might make someone trip, such as rocks or tools. Regularly check to see if handrails are loose or broken. Make sure that both sides of any steps have handrails. Any raised decks and porches should have guardrails on the edges. Have any leaves, snow, or ice cleared regularly. Use sand or salt on walking paths during winter. Clean up any spills in your garage right away. This includes oil or grease spills. What can I do in the bathroom? Use night lights. Install grab bars by the toilet and in the tub and shower. Do not use towel bars as grab bars. Use non-skid mats or decals in the tub or shower. If you need to sit down in the shower, use a plastic, non-slip stool. Keep the floor dry. Clean up any water that spills on the floor as soon as it happens. Remove soap buildup in the tub or shower regularly. Attach bath mats securely with double-sided non-slip rug tape. Do not have throw rugs and other things on the floor that can make you trip. What can I do in the bedroom? Use night lights. Make sure that you have a light by your bed that is easy to reach. Do not use  any sheets or blankets that are too big for your bed. They should not hang down onto the floor. Have a firm chair that has side arms. You can use this for support while you get dressed. Do not have throw rugs and other things on the floor that can make you trip. What can I do in the kitchen? Clean up any spills right away. Avoid walking on wet floors. Keep items that you use a lot in easy-to-reach places. If you need to reach something above you, use a strong step stool that has a grab bar. Keep electrical cords out of the way. Do not use floor polish or wax that makes floors slippery. If you must use wax, use non-skid floor wax. Do not have throw rugs and other things on the floor that can make you trip. What can I do with my stairs? Do not leave any items on the stairs. Make sure that there are handrails on both sides of the stairs and use them. Fix handrails that are broken or loose. Make sure that handrails are as long as the stairways. Check any carpeting to make sure that it is firmly attached to the stairs. Fix any carpet that is loose or worn. Avoid having throw rugs at the top or bottom of the stairs. If you do have throw rugs, attach them to the floor with carpet tape. Make sure  that you have a light switch at the top of the stairs and the bottom of the stairs. If you do not have them, ask someone to add them for you. What else can I do to help prevent falls? Wear shoes that: Do not have high heels. Have rubber bottoms. Are comfortable and fit you well. Are closed at the toe. Do not wear sandals. If you use a stepladder: Make sure that it is fully opened. Do not climb a closed stepladder. Make sure that both sides of the stepladder are locked into place. Ask someone to hold it for you, if possible. Clearly mark and make sure that you can see: Any grab bars or handrails. First and last steps. Where the edge of each step is. Use tools that help you move around (mobility aids)  if they are needed. These include: Canes. Walkers. Scooters. Crutches. Turn on the lights when you go into a dark area. Replace any light bulbs as soon as they burn out. Set up your furniture so you have a clear path. Avoid moving your furniture around. If any of your floors are uneven, fix them. If there are any pets around you, be aware of where they are. Review your medicines with your doctor. Some medicines can make you feel dizzy. This can increase your chance of falling. Ask your doctor what other things that you can do to help prevent falls. This information is not intended to replace advice given to you by your health care provider. Make sure you discuss any questions you have with your health care provider. Document Released: 12/18/2008 Document Revised: 07/30/2015 Document Reviewed: 03/28/2014 Elsevier Interactive Patient Education  2017 [employer].

## 2021-01-25 NOTE — Progress Notes (Signed)
Subjective:   Kristen [name] is a 75 y.o. female who presents for Medicare Annual (Subsequent) preventive examination.   I connected with Kristen [name] today by telephone and verified that I am speaking with the correct person using two identifiers. Location patient: home Location provider: work Persons participating in the virtual visit: patient, provider.   I discussed the limitations, risks, security and privacy concerns of performing an evaluation and management service by telephone and the availability of in person appointments. I also discussed with the patient that there may be a patient responsible charge related to this service. The patient expressed understanding and verbally consented to this telephonic visit.    Interactive audio and video telecommunications were attempted between this provider and patient, however failed, due to patient having technical difficulties OR patient did not have access to video capability.  We continued and completed visit with audio only.    Review of Systems     Cardiac Risk Factors include: advanced age (>18men, >47 women);dyslipidemia;diabetes mellitus;hypertension     Objective:    Today's Vitals   There is no height or weight on file to calculate BMI.  Advanced Directives 01/25/2021 11/13/2019 06/28/2019 02/16/2018 01/02/2018 10/12/2016 02/05/2016  Does Patient Have a Medical Advance Directive? No No No No No No No  Would patient like information on creating a medical advance directive? No - Patient declined Yes (MAU/Ambulatory/Procedural Areas - Information given) No - Patient declined Yes (MAU/Ambulatory/Procedural Areas - Information given) No - Patient declined Yes (ED - Information included in AVS) Yes (Inpatient - patient requests chaplain consult to create a medical advance directive)  Pre-existing out of facility DNR order (yellow form or pink MOST form) - - - - - - -  Some encounter information is confidential and restricted.  Go to Review Flowsheets activity to see all data.    Current Medications (verified) Outpatient Encounter Medications as of 01/25/2021  Medication Sig   ALPRAZolam (XANAX) 1 MG tablet 1  qam   1   q3;00 PM  half  qhs   Ascorbic Acid (VITAMIN C) 1000 MG tablet Take 1,000 mg by mouth daily.   atorvastatin (LIPITOR) 40 MG tablet TAKE ONE TABLET BY MOUTH DAILY   Blood Glucose Monitoring Suppl (ONE TOUCH ULTRA 2) w/Device KIT Use TID   Cholecalciferol (VITAMIN D3) 50 MCG (2000 UT) capsule Take 2,000 Units by mouth daily.   cyclobenzaprine (FLEXERIL) 10 MG tablet Take 10 mg by mouth 3 (three) times daily as needed for muscle spasms.   empagliflozin (JARDIANCE) 25 MG TABS tablet Take 1 tablet (25 mg total) by mouth daily. Via BI CARES pt assistance   furosemide (LASIX) 20 MG tablet TAKE ONE TABLET BY MOUTH EVERY MORNING   gabapentin (NEURONTIN) 300 MG capsule TAKE ONE CAPSULE BY MOUTH three times daily   glucose blood (COOL BLOOD GLUCOSE TEST STRIPS) test strip Use to test blood sugar TID. DX: E11.9   Lancets (ONETOUCH ULTRASOFT) lancets Use as instructed   magnesium oxide (MAG-OX) 400 MG tablet Take 1 tablet (400 mg total) by mouth 2 (two) times daily. WITH BREAKFAST AND EVENING MEALS.   metFORMIN (GLUCOPHAGE-XR) 500 MG 24 hr tablet TAKE TWO TABLETS BY MOUTH TWICE DAILY WITH food   metoprolol succinate (TOPROL-XL) 100 MG 24 hr tablet TAKE ONE TABLET BY MOUTH TWICE DAILY WITH OR immediately following A meal   Multiple Vitamin (MULTIVITAMIN) tablet Take 1 tablet by mouth daily.   nitroGLYCERIN (NITROSTAT) 0.4 MG SL tablet Place 1 tablet (0.4 mg  total) under the tongue every 5 (five) minutes as needed for chest pain (x 3 doses). Reported on 05/08/2015   NOVOFINE PEN NEEDLE 32G X 6 MM MISC USE with insulin AS DIRECTED   PERCOCET 10-325 MG tablet Take 1 tablet by mouth 5 (five) times daily as needed.   potassium chloride SA (KLOR-CON) 20 MEQ tablet TAKE ONE TABLET BY MOUTH TWICE DAILY   Semaglutide, 2  MG/DOSE, (OZEMPIC, 2 MG/DOSE,) 8 MG/3ML SOPN Inject 2 mg into the skin once a week.   spironolactone (ALDACTONE) 25 MG tablet Take 1 tablet (25 mg total) by mouth daily.   telmisartan (MICARDIS) 40 MG tablet TAKE ONE TABLET BY MOUTH daily   thiamine 100 MG tablet Take 1 tablet (100 mg total) by mouth daily.   TOUJEO MAX SOLOSTAR 300 UNIT/ML Solostar Pen INJECT 50 UNITS SUBCUTANEOUSLY DAILY   warfarin (COUMADIN) 3 MG tablet TAKE ONE TABLET BY MOUTH AS DIRECTED by clinic   No facility-administered encounter medications on file as of 01/25/2021.    Allergies (verified) Metformin and related, Cleocin [clindamycin hcl], Codeine, Doxycycline hyclate, Macrolides and ketolides, Morphine, Pentazocine lactate, Vibramycin [doxycycline calcium], Clindamycin, Definity [perflutren lipid microsphere], Doxycycline, Metformin, Pentazocine, Perflutren, Perflutren lipid microspheres, Sulfa antibiotics, and Sulfonamide derivatives   History: Past Medical History:  Diagnosis Date   Arthritis    Blood transfusion without reported diagnosis    Bursitis    CAD (coronary artery disease)    a. s/p CABG 2004.b. stable cath 2014 demonstrating stable CAD and continued patency of her LIMA graft.   Chronic anticoagulation    on coumadin   DDD (degenerative disc disease), lumbar    Depression    Diabetes mellitus    Diastolic dysfunction    per echo in October 2012 with EF 50 to 55%   Fibromyalgia    GERD (gastroesophageal reflux disease) 10/23/2003   Headache(784.0)    Hyperlipidemia    Hypertension    Hypokalemia    LBBB (left bundle branch block)    Lumbar back pain    LV dysfunction    a. EF 45% by cath 2014. b. EF 50-55% by technically difficult echo in 08/2014.   Lymphadenitis    Morbid obesity ([place])    a. Sleep study negative for significant OSA in 11/2014.   PAF (paroxysmal atrial fibrillation) ([place])    Stroke ([place]) 2004   affected speech per pt   Past Surgical History:  Procedure Laterality Date    ABDOMINAL HYSTERECTOMY     ANGIOPLASTY  laminectomy   CHOLECYSTECTOMY     CORONARY ARTERY BYPASS GRAFT     LIMA to LAD    KNEE ARTHROSCOPY     LEFT HEART CATHETERIZATION WITH CORONARY/GRAFT ANGIOGRAM  12/07/2012   Procedure: LEFT HEART CATHETERIZATION WITH Beatrix Fetters;  Surgeon: Blane Ohara, MD;  [facility] C[facility];  Service: Cardiovascular;;   LUMBAR LAMINECTOMY     x3   SPHINCTEROTOMY     TONSILLECTOMY     Family History  Problem Relation Age of Onset   Heart attack Father    Heart disease Father    Stroke Mother    Kidney disease Other    Stroke Other    Arthritis Other    Hypertension Other    Diabetes Other    Colon cancer Paternal Uncle    Depression Sister    Anxiety disorder Maternal Aunt    Social History   Socioeconomic History   Marital status: Married    Spouse name: Not on  file   Number of children: Not on file   Years of education: Not on file   Highest education level: Not on file  Occupational History   Not on file  Tobacco Use   Smoking status: Former    Types: Cigarettes    Quit date: 03/07/2001    Years since quitting: 19.9   Smokeless tobacco: Never  Vaping Use   Vaping Use: Never used  Substance and Sexual Activity   Alcohol use: No   Drug use: No   Sexual activity: Not Currently  Other Topics Concern   Not on file  Social History Narrative   Lives locally, has help available if needed.   Social Determinants of Health   Financial Resource Strain: Medium Risk   Difficulty of Paying Living Expenses: Somewhat hard  Food Insecurity: No Food Insecurity   Worried About Charity fundraiser in the Last Year: Never true   Ran Out of Food in the Last Year: Never true  Transportation Needs: No Transportation Needs   Lack of Transportation (Medical): No   Lack of Transportation (Non-Medical): No  Physical Activity: Inactive   Days of Exercise per Week: 0 days   Minutes of Exercise per Session: 0 min  Stress: Stress  Concern Present   Feeling of Stress : To some extent  Social Connections: Moderately Isolated   Frequency of Communication with Friends and Family: Twice a week   Frequency of Social Gatherings with Friends and Family: Twice a week   Attends Religious Services: Never   Printmaker: No   Attends Music therapist: Never   Marital Status: Married    Tobacco Counseling Counseling given: Not Answered   Clinical Intake:  Pre-visit preparation completed: Yes  Pain : No/denies pain     Nutritional Risks: None Diabetes: Yes CBG done?: No Did pt. bring in CBG monitor from home?: No  How often do you need to have someone help you when you read instructions, pamphlets, or other written materials from your doctor or pharmacy?: 1 - Never What is the last grade level you completed in school?: High School  Diabetic?yes Nutrition Risk Assessment:  Has the patient had any N/V/D within the last 2 months?  No  Does the patient have any non-healing wounds?  No  Has the patient had any unintentional weight loss or weight gain?  No   Diabetes:  Is the patient diabetic?  Yes  If diabetic, was a CBG obtained today?  No  Did the patient bring in their glucometer from home?  No  How often do you monitor your CBG's? 1-2 x day .   Financial Strains and Diabetes Management:  Are you having any financial strains with the device, your supplies or your medication? Yes .  Does the patient want to be seen by Chronic Care Management for management of their diabetes?  No  Would the patient like to be referred to a Nutritionist or for Diabetic Management?  No   Diabetic Exams:  Diabetic Eye Exam: Completed 11/2020 Diabetic Foot Exam: Overdue, Pt has been advised about the importance in completing this exam. Pt is scheduled for diabetic foot exam on next office visit .   Interpreter Needed?: No  Information entered by :: [place] [facility] Daily  Living In your present state of health, do you have any difficulty performing the following activities: 01/25/2021  Hearing? N  Vision? N  Difficulty concentrating or making decisions? N  Walking or climbing stairs? N  Dressing or bathing? N  Doing errands, shopping? N  Preparing Food and eating ? N  Using the Toilet? N  In the past six months, have you accidently leaked urine? N  Do you have problems with loss of bowel control? N  Managing your Medications? N  Managing your Finances? N  Housekeeping or managing your Housekeeping? N  Some recent data might be hidden    Patient Care Team: Etta Grandchild, MD as PCP - General (Internal Medicine) Tonny Bollman, MD as PCP - Cardiology (Cardiology) Duke Salvia, MD as Consulting Physician (Cardiology) Kathyrn Sheriff, [facility] as Pharmacist (Pharmacist) Mckinley Jewel, MD as Consulting Physician (Ophthalmology) Buck Mam, LCSW as Social Worker (Licensed Clinical Social Worker) Berneta Levins, Shela Leff, RN as Case Manager  Indicate any recent Medical Services you may have received from other than [facility] providers in the past year (date may be approximate).     Assessment:   This is a routine wellness examination for Kristen [name].  Hearing/Vision screen Vision Screening - Comments:: Annual eye exams   Dietary issues and exercise activities discussed: Current Exercise Habits: The patient does not participate in regular exercise at present, Exercise limited by: orthopedic condition(s)   Goals Addressed   None    Depression Screen PHQ 2/9 Scores 01/25/2021 01/25/2021 01/07/2021 06/03/2020 04/09/2020 02/26/2020 11/13/2019  PHQ - 2 Score 0 0 1 2 2  0 0  PHQ- 9 Score - - - 7 9 - -    Fall Risk Fall Risk  01/25/2021 01/07/2021 09/21/2020 05/01/2020 02/26/2020  Falls in the past year? 0 0 0 0 0  Number falls in past yr: 0 0 1 0 0  Injury with Fall? 0 0 0 0 0  Comment uses cane N/A- patient denies falls - N/A- no falls reported -  Risk  for fall due to : - Medication side effect;Impaired balance/gait Impaired balance/gait Medication side effect Impaired balance/gait;Impaired mobility;Medication side effect  Follow up Falls evaluation completed Falls prevention discussed - Falls prevention discussed Falls evaluation completed;Education provided;Falls prevention discussed  Comment - uses cane - - -    FALL RISK PREVENTION PERTAINING TO THE HOME:  Any stairs in or around the home? No  If so, are there any without handrails? No  Home free of loose throw rugs in walkways, pet beds, electrical cords, etc? Yes  Adequate lighting in your home to reduce risk of falls? Yes   ASSISTIVE DEVICES UTILIZED TO PREVENT FALLS:  Life alert? No  Use of a cane, walker or w/c? Yes  Grab bars in the bathroom? No  Shower chair or bench in shower? Yes  Elevated toilet seat or a handicapped toilet? No    Cognitive Function:    Normal cognitive status assessed by direct observation by this Nurse Health Advisor. No abnormalities found.      Immunizations Immunization History  Administered Date(s) Administered   Fluad Quad(high Dose 65+) 11/16/2018, 12/10/2019, 12/10/2020   Influenza Split 12/14/2010, 12/12/2011   Influenza Whole 01/05/2006, 01/29/2007, 12/26/2007, 11/28/2008, 12/04/2009   Influenza, High Dose Seasonal PF 11/12/2014, 01/26/2016, 11/25/2016, 12/27/2017   Influenza,inj,Quad PF,6+ Mos 11/12/2012, 04/08/2014   Moderna Sars-Covid-2 Vaccination 04/20/2019, 06/03/2019   Pneumococcal Conjugate-13 08/09/2013   Pneumococcal Polysaccharide-23 12/04/2009, 12/14/2010, 10/12/2016   Td 03/07/2005   Tdap 04/14/2016    TDAP status: Up to date  Flu Vaccine status: Up to date  Pneumococcal vaccine status: Up to date  Covid-19 vaccine status: Completed vaccines  Qualifies for  Shingles Vaccine? Yes   Zostavax completed No   Shingrix Completed?: No.    Education has been provided regarding the importance of this vaccine. Patient  has been advised to call insurance company to determine out of pocket expense if they have not yet received this vaccine. Advised may also receive vaccine at local pharmacy or Health Dept. Verbalized acceptance and understanding.  Screening Tests Health Maintenance  Topic Date Due   Zoster Vaccines- Shingrix (1 of 2) Never done   COVID-19 Vaccine (3 - Booster for Moderna series) 07/29/2019   OPHTHALMOLOGY EXAM  03/20/2020   MAMMOGRAM  12/26/2020   HEMOGLOBIN A1C  03/24/2021   FOOT EXAM  09/21/2021   COLONOSCOPY (Pts 45-14yrs Insurance coverage will need to be confirmed)  03/20/2022   TETANUS/TDAP  04/14/2026   Pneumonia Vaccine 31+ Years old  Completed   INFLUENZA VACCINE  Completed   DEXA SCAN  Completed   Hepatitis C Screening  Completed   HPV VACCINES  Aged Out    Health Maintenance  Health Maintenance Due  Topic Date Due   Zoster Vaccines- Shingrix (1 of 2) Never done   COVID-19 Vaccine (3 - Booster for Moderna series) 07/29/2019   OPHTHALMOLOGY EXAM  03/20/2020   MAMMOGRAM  12/26/2020    Colorectal cancer screening: Type of screening: Colonoscopy. Completed 03/20/2012. Repeat every 10 years  Mammogram status: Completed 01/25/2021. Repeat every year  Bone Density status: Completed 11/22/2016. Results reflect: Bone density results: OSTEOPENIA. Repeat every 5 years.  Lung Cancer Screening: (Low Dose CT Chest recommended if Age 14-80 years, 30 pack-year currently smoking OR have quit w/in 15years.) does not qualify.   Lung Cancer Screening Referral: n/a  Additional Screening:  Hepatitis C Screening: does not qualify;   Vision Screening: Recommended annual ophthalmology exams for early detection of glaucoma and other disorders of the eye. Is the patient up to date with their annual eye exam?  Yes  Who is the provider or what is the name of the office in which the patient attends annual eye exams? [facility] If pt is not established with a provider, would they like  to be referred to a provider to establish care? No .   Dental Screening: Recommended annual dental exams for proper oral hygiene  Community Resource Referral / Chronic Care Management: CRR required this visit?  No   CCM required this visit?  No      Plan:     I have personally reviewed and noted the following in the patient's chart:   Medical and social history Use of alcohol, tobacco or illicit drugs  Current medications and supplements including opioid prescriptions.  Functional ability and status Nutritional status Physical activity Advanced directives List of other physicians Hospitalizations, surgeries, and ER visits in previous 12 months Vitals Screenings to include cognitive, depression, and falls Referrals and appointments  In addition, I have reviewed and discussed with patient certain preventive protocols, quality metrics, and best practice recommendations. A written personalized care plan for preventive services as well as general preventive health recommendations were provided to patient.     Randel Pigg, LPN   [id number]   Nurse Notes: none

## 2021-01-27 ENCOUNTER — Ambulatory Visit: Payer: Medicare Other | Admitting: *Deleted

## 2021-01-27 DIAGNOSIS — I152 Hypertension secondary to endocrine disorders: Secondary | ICD-10-CM

## 2021-01-27 DIAGNOSIS — I5042 Chronic combined systolic (congestive) and diastolic (congestive) heart failure: Secondary | ICD-10-CM

## 2021-01-27 DIAGNOSIS — F32A Depression, unspecified: Secondary | ICD-10-CM

## 2021-01-27 DIAGNOSIS — E1149 Type 2 diabetes mellitus with other diabetic neurological complication: Secondary | ICD-10-CM

## 2021-01-27 DIAGNOSIS — G8929 Other chronic pain: Secondary | ICD-10-CM

## 2021-01-29 NOTE — Chronic Care Management (AMB) (Signed)
Chronic Care Management    Clinical Social Work Note  01/29/2021 Name: Kristen [name] MRN: [id number] DOB: Dec 14, 1945  Kristen [name] is a 75 y.o. year old female who is a primary care patient of [name], MD. The CCM team was consulted to assist the patient with chronic disease management and/or care coordination needs related to: Food Insecurity and Scientist, physiological Education.   Engaged with patient by telephone for follow up visit in response to provider referral for social work chronic care management and care coordination services.   Consent to Services:  The patient was given information about Chronic Care Management services, agreed to services, and gave verbal consent prior to initiation of services.  Please see initial visit note for detailed documentation.   Patient agreed to services and consent obtained.   Assessment: Review of patient past medical history, allergies, medications, and health status, including review of relevant consultants reports was performed today as part of a comprehensive evaluation and provision of chronic care management and care coordination services.     SDOH (Social Determinants of Health) assessments and interventions performed:    Advanced Directives Status: Not addressed in this encounter.  CCM Care Plan  Allergies  Allergen Reactions   Metformin And Related Other (See Comments)    Must take XR form only, cannot tolerate Regular release metformin   Cleocin [Clindamycin Hcl] Diarrhea   Codeine Itching   Doxycycline Hyclate Diarrhea   Macrolides And Ketolides Diarrhea   Morphine Hives   Pentazocine Lactate Itching and Nausea And Vomiting    (GENERIC- [place])   Vibramycin [Doxycycline Calcium] Diarrhea and Rash   Clindamycin Diarrhea   Definity [Perflutren Lipid Microsphere] Other (See Comments)    Patient complained of warm sensation in chest x few seconds duration when injected Definity.No other symptoms    Doxycycline Diarrhea   Metformin Other (See Comments)   Pentazocine Nausea Only   Perflutren Other (See Comments)   Perflutren Lipid Microspheres Other (See Comments)   Sulfa Antibiotics Diarrhea and Rash   Sulfonamide Derivatives Rash    Outpatient Encounter Medications as of 01/27/2021  Medication Sig   ALPRAZolam (XANAX) 1 MG tablet 1  qam   1   q3;00 PM  half  qhs   Ascorbic Acid (VITAMIN C) 1000 MG tablet Take 1,000 mg by mouth daily.   atorvastatin (LIPITOR) 40 MG tablet TAKE ONE TABLET BY MOUTH DAILY   Blood Glucose Monitoring Suppl (ONE TOUCH ULTRA 2) w/Device KIT Use TID   Cholecalciferol (VITAMIN D3) 50 MCG (2000 UT) capsule Take 2,000 Units by mouth daily.   cyclobenzaprine (FLEXERIL) 10 MG tablet Take 10 mg by mouth 3 (three) times daily as needed for muscle spasms.   empagliflozin (JARDIANCE) 25 MG TABS tablet Take 1 tablet (25 mg total) by mouth daily. Via BI CARES pt assistance   furosemide (LASIX) 20 MG tablet TAKE ONE TABLET BY MOUTH EVERY MORNING   gabapentin (NEURONTIN) 300 MG capsule TAKE ONE CAPSULE BY MOUTH three times daily   glucose blood (COOL BLOOD GLUCOSE TEST STRIPS) test strip Use to test blood sugar TID. DX: E11.9   Lancets (ONETOUCH ULTRASOFT) lancets Use as instructed   magnesium oxide (MAG-OX) 400 MG tablet Take 1 tablet (400 mg total) by mouth 2 (two) times daily. WITH BREAKFAST AND EVENING MEALS.   metFORMIN (GLUCOPHAGE-XR) 500 MG 24 hr tablet TAKE TWO TABLETS BY MOUTH TWICE DAILY WITH food   metoprolol succinate (TOPROL-XL) 100 MG 24 hr tablet TAKE ONE TABLET  BY MOUTH TWICE DAILY WITH OR immediately following A meal   Multiple Vitamin (MULTIVITAMIN) tablet Take 1 tablet by mouth daily.   nitroGLYCERIN (NITROSTAT) 0.4 MG SL tablet Place 1 tablet (0.4 mg total) under the tongue every 5 (five) minutes as needed for chest pain (x 3 doses). Reported on 05/08/2015   NOVOFINE PEN NEEDLE 32G X 6 MM MISC USE with insulin AS DIRECTED   PERCOCET 10-325 MG tablet  Take 1 tablet by mouth 5 (five) times daily as needed.   potassium chloride SA (KLOR-CON) 20 MEQ tablet TAKE ONE TABLET BY MOUTH TWICE DAILY   Semaglutide, 2 MG/DOSE, (OZEMPIC, 2 MG/DOSE,) 8 MG/3ML SOPN Inject 2 mg into the skin once a week.   spironolactone (ALDACTONE) 25 MG tablet Take 1 tablet (25 mg total) by mouth daily.   telmisartan (MICARDIS) 40 MG tablet TAKE ONE TABLET BY MOUTH daily   thiamine 100 MG tablet Take 1 tablet (100 mg total) by mouth daily.   TOUJEO MAX SOLOSTAR 300 UNIT/ML Solostar Pen INJECT 50 UNITS SUBCUTANEOUSLY DAILY   warfarin (COUMADIN) 3 MG tablet TAKE ONE TABLET BY MOUTH AS DIRECTED by clinic   No facility-administered encounter medications on file as of 01/27/2021.    Patient Active Problem List   Diagnosis Date Noted   PAF (paroxysmal atrial fibrillation) ([place])    Poor dentition requiring referral to dentistry 03/26/2020   Thiamine deficiency 12/30/2018   Vitamin B12 deficiency anemia due to intrinsic factor deficiency 12/20/2018   CHF (congestive heart failure), NYHA class I, chronic, combined ([place]) 12/19/2018   Coronary artery disease involving native coronary artery of native heart with angina pectoris ([place]) 12/19/2018   Obesity with body mass index (BMI) of 30.0 to 39.9 01/15/2018   Chronic idiopathic constipation 12/27/2017   Type 2 diabetes mellitus ([place]) 10/30/2017   Eczema 08/07/2017   Insomnia secondary to chronic pain 08/04/2016   Visit for screening mammogram 04/14/2016   LV dysfunction    Routine general medical examination at a health care facility 05/03/2013   DM (diabetes mellitus), type 2 with peripheral vascular complications ([place]) 56/38/7564   PVC's/nonsustained VT 10/24/2012   H/O: CVA (cerebrovascular accident) 02/09/2012   Hypothyroidism 05/27/2011   Fibromyalgia 10/08/2010   Long term current use of anticoagulant 05/05/2010   Low back pain, non-specific 05/12/2008   Cognitive dysfunction associated with depression 01/29/2007    Osteoarthritis 11/22/2006   Hyperlipidemia associated with type 2 diabetes mellitus ([place]) 09/13/2006   Coronary atherosclerosis 09/13/2006   GERD 09/13/2006   Hypertension associated with diabetes ([place]) 08/30/2006    Conditions to be addressed/monitored: Anxiety and Depression; Mental Health Concerns   Care Plan : LCSW Plan of Care  Updates made by Deirdre Peer, LCSW since 01/29/2021 12:00 AM     Problem: Lacks strategy for management of anxiety around sleeping and fear of dying.   Priority: High     Long-Range Goal: Establish and Maintain plan for anxiety management   Start Date: 04/09/2020  Expected End Date: 03/06/2021  This Visit's Progress: On track  Recent Progress: On track  Priority: High  Note:   Current Barriers:  Pt continues to have worry/fear and anxiety. Reports "afraid to go to sleep and die".  CSW encouraged pt to consider counseling support and she is now agreeable to referral being made- she will check with her insurance for copay/cost.  Pt acknowledges grief from the loss of her grandson- appears to be coping in a normal/healthy manner Chronic Mental Health needs related to  anxiety/PTSD - has participated in an initial visit with [facility] clinician and was not set up for follow up. Pt has decided to pause and does not plan on re-scheduling and/or connecting with new mental health therapist at this time. Limited social support, Mental Health Concerns , and Family and relationship dysfunction Suicidal Ideation/Homicidal Ideation: No Pt shares she is a bit overwhelmed by the pile of mail she has to go through and concerns for disposing of the personal documents-she continues to work on preparing them for shredding/trash Clinical Social Work Goal(s):  Over the next 30 days pt will establish with counseling options.  Pt will continue to focus on positive self talk and thought patterns, not over-reacting, and seeking ways to better cope with uncomfortable feelings as  they arise Over the next 90 days, patient will work with SW monthly by telephone to reduce or manage symptoms related to anxiety Interventions Pt reports Dr Casimiro Needle reduced her Xanax from 26m to 28mrecently- she has not noticed any significant issues with this.  She does admit to some GI issues and CSW suggests she discuss with her PCP, Pharmacist and/or GI Provider.  She voiced concerns about "all the changes with staff" at PCP office. Offered her support and reassurance the Providers and team are still looking after her needs and to not hesitate to call if needed.    Solution-Focused Strategies, Mindfulness or RePsychologist, educationalActive listening / Reflection utilized , Emotional Supportive Provided, Problem Solving /Task Center , Psychoeducation for mental health needs , Motivational Interviewing, and Brief CBT  Patient Coping Strengths:  Spirituality Hopefulness Self Advocate Able to Communicate Effectively [place] and trust with her Psychiatrist and PCP Patient Self Care Deficits:  Lacks social connections Patient Goals:  -reconsider the counseling and maybe once monthly counseling visits - Continue with visits with  Dr PlCasimiro Needlend further discuss with him about resuming therapy if you feel needed. - practice relaxation or meditation  -continue with positive self talk, "don't sweat the small stuff", and allowing yourself to be more aware of your emotions and actions that come - reach out to CSW and/or Dr PlCasimiro Needlef needs arise and want to pursue counseling appointment before scheduled visit with Dr PlCasimiro NeedleFollow Up Plan: Appointment scheduled for SW follow up with client by phone on:  02/23/21      Follow Up Plan: Appointment scheduled for SW follow up with client by phone on: 02/23/21      JaEduard ClosSW, LCDaytonicensed Clinical Social Worker [phone]

## 2021-01-29 NOTE — Patient Instructions (Signed)
Visit Information  Thank you for taking time to visit with me today. Please don't hesitate to contact me if I can be of assistance to you before our next scheduled telephone appointment.  Following are the goals we discussed today:  (Copy and paste patient goals from clinical care plan here)  Our next appointment is by telephone on 02/23/21 at 3:30  Please call the care guide team at [phone] if you need to cancel or reschedule your appointment.   Please call the Suicide and Crisis Lifeline: 988 call the [place] National Suicide Prevention Lifeline: 206-731-4977 or TTY: (650)643-2028 TTY (949)243-7284) to talk to a trained counselor call 1-800-273-TALK (toll free, 24 hour hotline) go to [facility] [address] [place] [phone]) call 911 if you are experiencing a Mental Health or [place] or need someone to talk to.  Patient verbalizes understanding of instructions provided today and agrees to view in [place].   Eduard Clos MSW, LCSW Licensed Clinical Social Worker [facility] [place] [phone]

## 2021-02-02 ENCOUNTER — Telehealth: Payer: Self-pay

## 2021-02-02 DIAGNOSIS — Z7901 Long term (current) use of anticoagulants: Secondary | ICD-10-CM

## 2021-02-02 MED ORDER — WARFARIN SODIUM 3 MG PO TABS: ORAL_TABLET | ORAL | 2 refills | Status: DC

## 2021-02-02 NOTE — Telephone Encounter (Signed)
Ozempic here Pt informed Clinical lab fridge.

## 2021-02-02 NOTE — Telephone Encounter (Signed)
Pt called requesting refill of warfarin to be sent to [facility].  Pt compliant with warfarin management and seeing PCP. Sent in refill

## 2021-02-03 DIAGNOSIS — I25119 Atherosclerotic heart disease of native coronary artery with unspecified angina pectoris: Secondary | ICD-10-CM

## 2021-02-03 DIAGNOSIS — I480 Paroxysmal atrial fibrillation: Secondary | ICD-10-CM

## 2021-02-03 DIAGNOSIS — F32A Depression, unspecified: Secondary | ICD-10-CM | POA: Diagnosis not present

## 2021-02-03 DIAGNOSIS — F419 Anxiety disorder, unspecified: Secondary | ICD-10-CM

## 2021-02-03 DIAGNOSIS — E785 Hyperlipidemia, unspecified: Secondary | ICD-10-CM

## 2021-02-03 DIAGNOSIS — Z794 Long term (current) use of insulin: Secondary | ICD-10-CM

## 2021-02-03 DIAGNOSIS — E1169 Type 2 diabetes mellitus with other specified complication: Secondary | ICD-10-CM

## 2021-02-03 DIAGNOSIS — E1159 Type 2 diabetes mellitus with other circulatory complications: Secondary | ICD-10-CM

## 2021-02-03 DIAGNOSIS — E1149 Type 2 diabetes mellitus with other diabetic neurological complication: Secondary | ICD-10-CM | POA: Diagnosis not present

## 2021-02-03 DIAGNOSIS — I152 Hypertension secondary to endocrine disorders: Secondary | ICD-10-CM

## 2021-02-03 DIAGNOSIS — I5042 Chronic combined systolic (congestive) and diastolic (congestive) heart failure: Secondary | ICD-10-CM

## 2021-02-04 ENCOUNTER — Telehealth: Payer: Medicare Other

## 2021-02-04 ENCOUNTER — Telehealth: Payer: Self-pay | Admitting: *Deleted

## 2021-02-04 NOTE — Telephone Encounter (Signed)
   Telephone encounter was:  Successful.  02/04/2021 Name: Kristen [name] MRN: [id number] DOB: 08-04-1945  Kristen [name] is a 75 y.o. year old female who is a primary care patient of Ronnald Ramp, Arvid Right, MD . The community resource team was consulted for assistance with Patient needing dental care and advised will email a list of clinics in the area also advised to check for new edicare advantage plans that might provide more dental coverage   Care guide performed the following interventions: Patient provided with information about care guide support team and interviewed to confirm resource needs Follow up call placed to community resources to determine status of patients referral.  Follow Up Plan:  No further follow up planned at this time. The patient has been provided with needed resources.  [place], Care Management  [phone] [address] , [place] [address] Email : [email or web address]

## 2021-02-09 DIAGNOSIS — M150 Primary generalized (osteo)arthritis: Secondary | ICD-10-CM | POA: Diagnosis not present

## 2021-02-09 DIAGNOSIS — Z79891 Long term (current) use of opiate analgesic: Secondary | ICD-10-CM | POA: Diagnosis not present

## 2021-02-09 DIAGNOSIS — M47816 Spondylosis without myelopathy or radiculopathy, lumbar region: Secondary | ICD-10-CM | POA: Diagnosis not present

## 2021-02-09 DIAGNOSIS — G894 Chronic pain syndrome: Secondary | ICD-10-CM | POA: Diagnosis not present

## 2021-02-10 ENCOUNTER — Other Ambulatory Visit: Payer: Self-pay | Admitting: Internal Medicine

## 2021-02-10 DIAGNOSIS — I5042 Chronic combined systolic (congestive) and diastolic (congestive) heart failure: Secondary | ICD-10-CM

## 2021-02-16 ENCOUNTER — Ambulatory Visit (INDEPENDENT_AMBULATORY_CARE_PROVIDER_SITE_OTHER): Payer: Medicare Other

## 2021-02-16 ENCOUNTER — Other Ambulatory Visit: Payer: Self-pay

## 2021-02-16 ENCOUNTER — Telehealth: Payer: Medicare Other

## 2021-02-16 DIAGNOSIS — Z7901 Long term (current) use of anticoagulants: Secondary | ICD-10-CM | POA: Diagnosis not present

## 2021-02-16 LAB — POCT INR: INR: 1.6 — AB (ref 2.0–3.0)

## 2021-02-16 NOTE — Progress Notes (Addendum)
Increase dose today to take 1 1/2 tablets and increase dose tomorrow to take 1 1/2 tablet and then change weekly dose to take 1 tablet daily except take 1 1/2 tablets on Mondays and Thursday.  Re-check in 2 weeks.

## 2021-02-16 NOTE — Patient Instructions (Addendum)
Pre visit review using our clinic review tool, if applicable. No additional management support is needed unless otherwise documented below in the visit note.  Increase dose today to take 1 1/2 tablets and increase dose tomorrow to take 1 1/2 tablet and then change weekly dose to take 1 tablet daily except take 1 1/2 tablets on Mondays and Thursday.  Re-check in 2 weeks.

## 2021-02-17 ENCOUNTER — Ambulatory Visit (INDEPENDENT_AMBULATORY_CARE_PROVIDER_SITE_OTHER): Payer: Medicare Other | Admitting: *Deleted

## 2021-02-17 DIAGNOSIS — I5042 Chronic combined systolic (congestive) and diastolic (congestive) heart failure: Secondary | ICD-10-CM

## 2021-02-17 DIAGNOSIS — E1149 Type 2 diabetes mellitus with other diabetic neurological complication: Secondary | ICD-10-CM

## 2021-02-18 NOTE — Chronic Care Management (AMB) (Signed)
Chronic Care Management   CCM RN Visit Note  02/18/2021 Name: Kristen [name] MRN: [id number] DOB: [birth year]  Subjective: Kristen [name] is a 75 y.o. year old female who is a primary care patient of [name], MD. The care management team was consulted for assistance with disease management and care coordination needs.    Engaged with patient by telephone for follow up visit in response to provider referral for case management and/or care coordination services.   Consent to Services:  The patient was given information about Chronic Care Management services, agreed to services, and gave verbal consent prior to initiation of services.  Please see initial visit note for detailed documentation.  Patient agreed to services and verbal consent obtained.   Assessment: Review of patient past medical history, allergies, medications, health status, including review of consultants reports, laboratory and other test data, was performed as part of comprehensive evaluation and provision of chronic care management services.   CCM Care Plan Allergies  Allergen Reactions   Metformin And Related Other (See Comments)    Must take XR form only, cannot tolerate Regular release metformin   Cleocin [Clindamycin Hcl] Diarrhea   Codeine Itching   Doxycycline Hyclate Diarrhea   Macrolides And Ketolides Diarrhea   Morphine Hives   Pentazocine Lactate Itching and Nausea And Vomiting    (GENERIC- [place])   Vibramycin [Doxycycline Calcium] Diarrhea and Rash   Clindamycin Diarrhea   Definity [Perflutren Lipid Microsphere] Other (See Comments)    Patient complained of warm sensation in chest x few seconds duration when injected Definity.No other symptoms   Doxycycline Diarrhea   Metformin Other (See Comments)   Pentazocine Nausea Only   Perflutren Other (See Comments)   Perflutren Lipid Microspheres Other (See Comments)   Sulfa Antibiotics Diarrhea and Rash   Sulfonamide Derivatives Rash    Outpatient Encounter Medications as of 02/17/2021  Medication Sig   ALPRAZolam (XANAX) 1 MG tablet 1  qam   1   q3;00 PM  half  qhs   Ascorbic Acid (VITAMIN C) 1000 MG tablet Take 1,000 mg by mouth daily.   atorvastatin (LIPITOR) 40 MG tablet TAKE ONE TABLET BY MOUTH DAILY   Blood Glucose Monitoring Suppl (ONE TOUCH ULTRA 2) w/Device KIT Use TID   Cholecalciferol (VITAMIN D3) 50 MCG (2000 UT) capsule Take 2,000 Units by mouth daily.   cyclobenzaprine (FLEXERIL) 10 MG tablet Take 10 mg by mouth 3 (three) times daily as needed for muscle spasms.   empagliflozin (JARDIANCE) 25 MG TABS tablet Take 1 tablet (25 mg total) by mouth daily. Via BI CARES pt assistance   furosemide (LASIX) 20 MG tablet TAKE ONE TABLET BY MOUTH EVERY MORNING   gabapentin (NEURONTIN) 300 MG capsule TAKE ONE CAPSULE BY MOUTH three times daily   glucose blood (COOL BLOOD GLUCOSE TEST STRIPS) test strip Use to test blood sugar TID. DX: E11.9   Lancets (ONETOUCH ULTRASOFT) lancets Use as instructed   magnesium oxide (MAG-OX) 400 MG tablet Take 1 tablet (400 mg total) by mouth 2 (two) times daily. WITH BREAKFAST AND EVENING MEALS.   metFORMIN (GLUCOPHAGE-XR) 500 MG 24 hr tablet TAKE TWO TABLETS BY MOUTH TWICE DAILY WITH food   metoprolol succinate (TOPROL-XL) 100 MG 24 hr tablet TAKE ONE TABLET BY MOUTH TWICE DAILY WITH OR immediately following A meal   Multiple Vitamin (MULTIVITAMIN) tablet Take 1 tablet by mouth daily.   nitroGLYCERIN (NITROSTAT) 0.4 MG SL tablet Place 1 tablet (0.4 mg total) under the tongue  every 5 (five) minutes as needed for chest pain (x 3 doses). Reported on 05/08/2015   NOVOFINE PEN NEEDLE 32G X 6 MM MISC USE with insulin AS DIRECTED   PERCOCET 10-325 MG tablet Take 1 tablet by mouth 5 (five) times daily as needed.   potassium chloride SA (KLOR-CON) 20 MEQ tablet TAKE ONE TABLET BY MOUTH TWICE DAILY   Semaglutide, 2 MG/DOSE, (OZEMPIC, 2 MG/DOSE,) 8 MG/3ML SOPN Inject 2 mg into the skin once a week.    spironolactone (ALDACTONE) 25 MG tablet TAKE ONE TABLET BY MOUTH ONCE DAILY   telmisartan (MICARDIS) 40 MG tablet TAKE ONE TABLET BY MOUTH daily   thiamine 100 MG tablet Take 1 tablet (100 mg total) by mouth daily.   TOUJEO MAX SOLOSTAR 300 UNIT/ML Solostar Pen INJECT 50 UNITS SUBCUTANEOUSLY DAILY   warfarin (COUMADIN) 3 MG tablet TAKE 1 TABLET DAILY BY MOUTH EXCEPT TAKE 1/2 TABLET ON WEDNESDAYS OR AS DIRECTED BY ANTICOAGULATION CLINIC   No facility-administered encounter medications on file as of 02/17/2021.   Patient Active Problem List   Diagnosis Date Noted   PAF (paroxysmal atrial fibrillation) ([place])    Poor dentition requiring referral to dentistry 03/26/2020   Thiamine deficiency 12/30/2018   Vitamin B12 deficiency anemia due to intrinsic factor deficiency 12/20/2018   CHF (congestive heart failure), NYHA class I, chronic, combined ([place]) 12/19/2018   Coronary artery disease involving native coronary artery of native heart with angina pectoris ([place]) 12/19/2018   Obesity with body mass index (BMI) of 30.0 to 39.9 01/15/2018   Chronic idiopathic constipation 12/27/2017   Type 2 diabetes mellitus ([place]) 10/30/2017   Eczema 08/07/2017   Insomnia secondary to chronic pain 08/04/2016   Visit for screening mammogram 04/14/2016   LV dysfunction    Routine general medical examination at a health care facility 05/03/2013   DM (diabetes mellitus), type 2 with peripheral vascular complications ([place]) 49/17/9150   PVC's/nonsustained VT 10/24/2012   H/O: CVA (cerebrovascular accident) 02/09/2012   Hypothyroidism 05/27/2011   Fibromyalgia 10/08/2010   Long term current use of anticoagulant 05/05/2010   Low back pain, non-specific 05/12/2008   Cognitive dysfunction associated with depression 01/29/2007   Osteoarthritis 11/22/2006   Hyperlipidemia associated with type 2 diabetes mellitus ([place]) 09/13/2006   Coronary atherosclerosis 09/13/2006   GERD 09/13/2006   Hypertension associated with  diabetes ([place]) 08/30/2006   Conditions to be addressed/monitored:  CHF and DMII  Care Plan : RN Care Manager Plan of Care  Updates made by Knox Royalty, RN since 02/18/2021 12:00 AM     Problem: Chronic Disease Management Needs   Priority: High     Long-Range Goal: Development of plan of care for long term chronic disease management   Start Date: 01/07/2021  Expected End Date: 01/07/2022  Recent Progress: On track  Priority: High  Note:   Current Barriers:  Chronic Disease Management support and education needs related to CHF and DMII Unable to independently verbalize appropriate choices for DM soft diet- requires ongoing reinforcement / support Does not adhere to provider recommendations re: following low carb/ sugar diet Ongoing dental issues- unable to afford dental care: community resource care guide referral placed for same Chronic pain: followed regularly by pain management provider  RNCM Clinical Goal(s):  Patient will demonstrate Ongoing health management independence CHF; DMII  through collaboration with RN Care manager, provider, and care team.   Interventions: 1:1 collaboration with primary care provider regarding development and update of comprehensive plan of care as evidenced by  provider attestation and co-signature Inter-disciplinary care team collaboration (see longitudinal plan of care) Evaluation of current treatment plan related to  self management and patient's adherence to plan as established by provider  Diabetes Interventions: GOAL status: ON TRACK; Long Term goal  Counseled on importance of regular laboratory monitoring as prescribed Discussed plans with patient for ongoing care management follow up and provided patient with direct contact information for care management team Reviewed scheduled/upcoming provider appointments including: 02/23/21: CCM CSW; 03/04/21- coumadin clinic/ follow up; 03/17/21- PCP; 04/08/21- [facility]; 04/30/21- cardiology  provider;  Discussed ongoing challenges following carbohydrate modified, low sugar diet because of her dental issues; confirms today that she spoke with [facility] about resources available for affordable dental care: reports she has not received the e-mail list of resources: re-provides e-mail address; will ask [facility] to re-send via e-mail; encouraged patient to follow up on resources once she receives e-mail Confirmed patient continues monitoring/ recording blood sugars at home, reports "overall okay" fasting blood sugars with ranges between 90- 140, post-prandial's between 180-210 Confirmed no recent low blood sugars at home Reinforced previously provided education around dietary choices, portion control in setting of DMII-- patient requires ongoing reinforcement/ support; continues to report she "knows she eats too many sweets" Confirmed continues independently taking weekly Ozempic to 2 mg/ week; reports currently using Toujeo insulin at "48 units"  Lab Results  Component Value Date   HGBA1C 8.6 (H) 09/21/2020  Heart Failure Interventions:  GOAL status: ON TRACK; Long Term Goal Basic overview and discussion of pathophysiology of Heart Failure reviewed Reviewed Heart Failure Action Plan in depth and provided written copy Discussed importance of daily weight and advised patient to weigh and record daily Reviewed role of diuretics in prevention of fluid overload and management of heart failure; Discussed the importance of keeping all appointments with provider Reviewed with patient recent weights at home: she reports weight ranges consistently between 190-192 lbs: positive reinforcement provided: this reports represents weight loss from previously reported weights  Reinforced previously provided education around signs/ symptoms yellow CHF zone-- she requires ongoing reinforcement and support; she denies signs/ symptoms today, states "breathing is  okay;" denies lower extremity swelling outside of normal/ baseline Discussed recent visit to coumadin clinic: values were "really off;" patient speculates this is due to her consumption of greens recently; we discussed options for ACT, including possibility of switching from coumadin to Eliquis: she is not interested in switching at this time, due to cost constraints with Eliquis Rx: patient verbalizes good understanding of current coumadin dosing post- visit coumadin clinic visit yesterday  Patient Goals/Self-Care Activities: As evidenced by review of EHR and patient reporting during CCM RN CM outreach,  Patient Fraser Din will: Take medications as prescribed Attend all scheduled provider appointments Call pharmacy for medication refills Call provider office for new concerns or questions Continue checking blood sugar twice daily and write down these values from home monitoring: check first thing in the morning before you eat and again 2 hours after a large meal Frequently review the information you have been sent about choices of food that are good to eat when you have diabetes, continue your efforts to begin making small changes to your soft diet to decrease you sugar and carbohydrate intake at home Try to follow a heart healthy, low salt, low carbohydrate and low sugar diet Continue to weigh daily and track weight on paper You reported today general weight ranges between 190-192 lbs Call cardiologist/ care  providers if I gain more than 2 pounds in one day or 5 pounds in one week Watch for swelling in feet, ankles and legs every day   Once you receive the provided resources for dental providers, please act to contact the resource providers        Follow Up Plan:   Telephone follow up appointment with care management team member scheduled for:  Monday, March 29, 2021 at 2:30 pm The patient has been provided with contact information for the care management team and has been advised to call with any  health related questions or concerns      Plan: The patient has been provided with contact information for the care management team and has been advised to call with any health related questions or concerns   [name], RN, BSN, [facility] RN Care Coordination- [place] [phone]: direct office

## 2021-02-18 NOTE — Patient Instructions (Signed)
Visit Information Kristen [name], thank you for taking time to talk with me today. Please don't hesitate to contact me if I can be of assistance to you before our next scheduled telephone appointment.  Below are the goals we discussed today:   Patient Self-Care Activities: Patient Kristen [name] will: Take medications as prescribed Attend all scheduled provider appointments Call pharmacy for medication refills Call provider office for new concerns or questions Continue checking blood sugar twice daily and write down these values from home monitoring: check first thing in the morning before you eat and again 2 hours after a large meal Frequently review the information you have been sent about choices of food that are good to eat when you have diabetes, continue your efforts to begin making small changes to your soft diet to decrease you sugar and carbohydrate intake at home Try to follow a heart healthy, low salt, low carbohydrate and low sugar diet Continue to weigh daily and track weight on paper You reported today general weight ranges between 190-192 lbs Call cardiologist/ care providers if I gain more than 2 pounds in one day or 5 pounds in one week Watch for swelling in feet, ankles and legs every day   Once you receive the provided resources for dental providers, please act to contact the resource providers        Our next scheduled telephone follow up visit/ appointment with care management team member is scheduled on:  Monday, March 29, 2021 at 2:30 pm  If you need to cancel or re-schedule our visit, please call [phone] and our care guide team will be happy to assist you.   I look forward to hearing about your progress.   [name], RN, BSN, [place] [phone]: direct office  If you are experiencing a Mental Health or [place] or need someone to talk to, please  call the Suicide and Crisis Lifeline:  988 call the [place] National Suicide Prevention Lifeline: 863-106-9667 or TTY: 647-076-7589 TTY (307)626-9882) to talk to a trained counselor call 1-800-273-TALK (toll free, 24 hour hotline) go to [facility] [address] [place] [id number]) call 911   Patient verbalizes understanding of instructions provided today and agrees to view in [place] With Diabetes Diabetes (type 1 diabetes mellitus or type 2 diabetes mellitus) is a condition in which the body does not have enough of a hormone called insulin, or the body does not respond properly to insulin. Normally, insulin allows sugars (glucose) to enter cells in the body. With diabetes, extra glucose builds up in the blood instead of going into cells. This results in high blood glucose (hyperglycemia). How to manage lifestyle changes Managing diabetes includes medical treatments as well as lifestyle changes. If diabetes is not managed well, serious physical and emotional complications can occur. Taking good care of yourself means that you are responsible for: Monitoring glucose regularly. Eating a healthy diet. Exercising regularly. Meeting with health care providers. Taking medicines as directed. Most people feel some stress about managing their diabetes. When this stress becomes too much, it is known as diabetes-related distress. This is very common. Living with diabetes can place you at risk for diabetes distress, depression, or anxiety. These disorders can make diabetes more difficult to manage. How to recognize stress You may have diabetes distress if you: Avoid or ignore your daily diabetes care. This includes glucose testing, following a meal plan, and taking medications. Feel  overwhelmed by your daily diabetes care. Experience emotional reactions such as anger, sadness, or fear related to your daily diabetes care. Feel fear or shame about not doing everything perfectly that you have  been told to do. Emotional distress Symptoms of diabetes distress include: Anger about having a diagnosis of diabetes. Fear or frustration about your diagnosis and the changes you need to make to manage the condition. Being overly worried about the care that you need or the cost of the care that you need. Feeling like you caused your condition by doing something wrong. Fear about unpredictable fluctuations in your blood glucose, like low or high blood glucose. Feeling judged by your health care providers. Feeling very alone with the disease. Depression Having diabetes means that you are at a higher risk for depression. Your health care provider may test (screen) you for symptoms of depression. It is important to recognize symptoms and to start treatment for depression soon after it is diagnosed. The following are some symptoms of depression: Loss of interest in things that you used to enjoy. Feeling depressed much or most of the time. A change in appetite. Trouble getting to sleep or staying asleep. Feeling tired most of the day. Feeling nervous and anxious. Feeling guilty and worrying that you are a burden to others. Having thoughts of hurting yourself or feeling that you want to die. If you have any of these symptoms, more days than not, for 2 weeks or longer, you may have depression. This would be a good time to contact your health care provider. Follow these instructions at home: Managing diabetes distress The following are some ways to manage emotional distress: Learn as much as you can about diabetes and its treatment. Take one step at a time to improve your management. Meet with a certified diabetes care and education specialist. Take a class to learn how to manage your condition. Consider working with a counselor or therapist. Keep a journal of your thoughts and concerns. Accept that some things are out of your control. Talk with other people who have diabetes. It can help to  talk about the distress that you feel. Find ways to manage stress that work for you. These may include art or music therapy, exercise, meditation, and hobbies. Seek support from spiritual leaders, family, and friends.  General instructions Do your best to follow your diabetes management plan. If you are struggling to follow your plan, talk with a certified diabetes care and education specialist, or with someone else who has diabetes. They may have ideas that will help. Forgive yourself for not being perfect. Almost everyone struggles with the tasks of diabetes. Keep all follow-up visits. This is important. Where to find support Search for information and support from the American Diabetes Association: www.diabetes.org Find a certified diabetes education and care specialist. Make an appointment through the Association of Diabetes Care & Education Specialists: www.diabeteseducator.org Contact a health care provider if: You believe your diabetes is getting out of control. You are concerned you may be depressed. You think your medications are not helping control your diabetes. You are feeling overwhelmed with your diabetes. Get help right away if: You have thoughts about hurting yourself or others. If you ever feel like you may hurt yourself or others, or have thoughts about taking your own life, get help right away. You can go to your nearest emergency department or call: Your local emergency services (911 in the [place] A suicide crisis helpline, such as the [facility] at  903-498-0618 or 988 in the [place] This is open 24 hours a day. Summary Diabetes (type 1 diabetes mellitus or type 2 diabetes mellitus) is a condition in which the body does not have enough of a hormone called insulin, or the body does not respond properly to insulin. Living with diabetes puts you at risk for medical and emotional issues, such as diabetes distress, depression, and anxiety. Recognizing  the symptoms of diabetes distress and depression may help you avoid problems with your diabetes control. If you experience symptoms, it is important to discuss this with your health care provider, certified diabetes care and education specialist, or therapist. It is important to start treatment for diabetes distress and depression soon after diagnosis. Ask your health care provider to recommend a therapist who understands both depression and diabetes. This information is not intended to replace advice given to you by your health care provider. Make sure you discuss any questions you have with your health care provider. Document Revised: 09/16/2020 Document Reviewed: 07/04/2019 Elsevier Patient Education  [place].

## 2021-02-23 ENCOUNTER — Ambulatory Visit: Payer: Medicare Other | Admitting: *Deleted

## 2021-02-23 ENCOUNTER — Telehealth (HOSPITAL_BASED_OUTPATIENT_CLINIC_OR_DEPARTMENT_OTHER): Payer: Medicare Other | Admitting: Psychiatry

## 2021-02-23 ENCOUNTER — Other Ambulatory Visit: Payer: Self-pay

## 2021-02-23 DIAGNOSIS — I5042 Chronic combined systolic (congestive) and diastolic (congestive) heart failure: Secondary | ICD-10-CM

## 2021-02-23 DIAGNOSIS — F4323 Adjustment disorder with mixed anxiety and depressed mood: Secondary | ICD-10-CM | POA: Diagnosis not present

## 2021-02-23 DIAGNOSIS — Z794 Long term (current) use of insulin: Secondary | ICD-10-CM

## 2021-02-23 DIAGNOSIS — F419 Anxiety disorder, unspecified: Secondary | ICD-10-CM

## 2021-02-23 MED ORDER — ALPRAZOLAM 1 MG PO TABS: ORAL_TABLET | ORAL | 2 refills | Status: DC

## 2021-02-23 NOTE — Patient Instructions (Signed)
Visit Information ° °Thank you for taking time to visit with me today. Please don't hesitate to contact me if I can be of assistance to you. ° ° °Please call the care guide team at [phone] if you need to cancel or reschedule your appointment.  ° °If you are experiencing a Mental Health or Behavioral Health Crisis or need someone to talk to, please call the Suicide and Crisis Lifeline: 988 °call the [place] National Suicide Prevention Lifeline: 1-800-273-8255 or TTY: 1-800-799-4 TTY (1-800-799-4889) to talk to a trained counselor °call 1-800-273-TALK (toll free, 24 hour hotline) °go to [facility] [address], [place] ([phone]) °call 911  ° °The patient verbalized understanding of instructions, educational materials, and care plan provided today and declined offer to receive copy of patient instructions, educational materials, and care plan.  °Alexia Dinger MSW, LCSW °Licensed Clinical Social Worker °[facility] °[phone]  °

## 2021-02-23 NOTE — Progress Notes (Signed)
She is only fair. None Patient ID: Kristen [name], female   DOB: 12-06-1945, 75 y.o.   MRN: [id number] [facility] MD Progress Note  02/23/2021 2:22 PM Nalayah Hitt  MRN:  [id number] Subjective:  Scared Principal Problem: Adjustment disorder with an anxious mood state  Today the patient is at her baseline.  She has not noticed any changes since we reduced her Xanax to taking 1 mg in the morning 1 midday and a half at night.  The patient has chronic problems with sleep.  She has had multiple sleep studies.  She ends up going off to sleep at about 1 in the morning and wakes up at about 3 or 4.  She takes no naps during the day.  She says she feels tired a lot.  The patient has significant physical problems.  She is very impacted left leg that is extremely weak and is clearly improved peptic problem.  The patient's mood is somewhat stable.  She has 2 sons 1 divorce and 1 marriage.  She has 4 grandsons.  All of them are doing pretty well.  The patient seems to be slower in her language.  She receives oxycodone from Dr. Hardin Negus 10 mg 3 or 4 a day.  The patient has diabetes with a hemoglobin A1c she says at around 8.  The patient describes chronic pain in her legs and her feet limit her ability to walk.  She is very uncomfortable with the idea of coming into the office to see me.  Virtual Visit via Telephone Note  I connected with Kristen [name] on 02/23/21 at  1:30 PM EST by telephone and verified that I am speaking with the correct person using two identifiers.  Location: Patient: home Provider: office   I discussed the limitations, risks, security and privacy concerns of performing an evaluation and management service by telephone and the availability of in person appointments. I also discussed with the patient that there may be a patient responsible charge related to this service. The patient expressed understanding and agreed to proceed.     I discussed the assessment and treatment  plan with the patient. The patient was provided an opportunity to ask questions and all were answered. The patient agreed with the plan and demonstrated an understanding of the instructions.   The patient was advised to call back or seek an in-person evaluation if the symptoms worsen or if the condition fails to improve as anticipated.  I provided 30 minutes of non-face-to-face time during this encounter.   Jerral Ralph, MD  Past Medical History:  Diagnosis Date   Arthritis    Blood transfusion without reported diagnosis    Bursitis    CAD (coronary artery disease)    a. s/p CABG 2004.b. stable cath 2014 demonstrating stable CAD and continued patency of her LIMA graft.   Chronic anticoagulation    on coumadin   DDD (degenerative disc disease), lumbar    Depression    Diabetes mellitus    Diastolic dysfunction    per echo in October 2012 with EF 50 to 55%   Fibromyalgia    GERD (gastroesophageal reflux disease) 10/23/2003   Headache(784.0)    Hyperlipidemia    Hypertension    Hypokalemia    LBBB (left bundle branch block)    Lumbar back pain    LV dysfunction    a. EF 45% by cath 2014. b. EF 50-55% by technically difficult echo in 08/2014.   Lymphadenitis  Morbid obesity ([place])    a. Sleep study negative for significant OSA in 11/2014.   PAF (paroxysmal atrial fibrillation) ([place])    Stroke ([place]) 2004   affected speech per pt    Past Surgical History:  Procedure Laterality Date   ABDOMINAL HYSTERECTOMY     ANGIOPLASTY  laminectomy   CHOLECYSTECTOMY     CORONARY ARTERY BYPASS GRAFT     LIMA to LAD    KNEE ARTHROSCOPY     LEFT HEART CATHETERIZATION WITH CORONARY/GRAFT ANGIOGRAM  12/07/2012   Procedure: LEFT HEART CATHETERIZATION WITH Beatrix Fetters;  Surgeon: Blane Ohara, MD;  Location: [facility] CATH LAB;  Service: Cardiovascular;;   LUMBAR LAMINECTOMY     x3   SPHINCTEROTOMY     TONSILLECTOMY     Family History:  Family History  Problem Relation Age of  Onset   Heart attack Father    Heart disease Father    Stroke Mother    Kidney disease Other    Stroke Other    Arthritis Other    Hypertension Other    Diabetes Other    Colon cancer Paternal Uncle    Depression Sister    Anxiety disorder Maternal Aunt    Family Psychiatric  History:  Social History:  Social History   Substance and Sexual Activity  Alcohol Use No     Social History   Substance and Sexual Activity  Drug Use No    Social History   Socioeconomic History   Marital status: Married    Spouse name: Not on file   Number of children: Not on file   Years of education: Not on file   Highest education level: Not on file  Occupational History   Not on file  Tobacco Use   Smoking status: Former    Types: Cigarettes    Quit date: 03/07/2001    Years since quitting: 19.9   Smokeless tobacco: Never  Vaping Use   Vaping Use: Never used  Substance and Sexual Activity   Alcohol use: No   Drug use: No   Sexual activity: Not Currently  Other Topics Concern   Not on file  Social History Narrative   Lives locally, has help available if needed.   Social Determinants of Health   Financial Resource Strain: Medium Risk   Difficulty of Paying Living Expenses: Somewhat hard  Food Insecurity: No Food Insecurity   Worried About Charity fundraiser in the Last Year: Never true   Ran Out of Food in the Last Year: Never true  Transportation Needs: No Transportation Needs   Lack of Transportation (Medical): No   Lack of Transportation (Non-Medical): No  Physical Activity: Inactive   Days of Exercise per Week: 0 days   Minutes of Exercise per Session: 0 min  Stress: Stress Concern Present   Feeling of Stress : To some extent  Social Connections: Moderately Isolated   Frequency of Communication with Friends and Family: Twice a week   Frequency of Social Gatherings with Friends and Family: Twice a week   Attends Religious Services: Never   Marine scientist or  Organizations: No   Attends Music therapist: Never   Marital Status: Married   Additional Social History:                         Sleep: Fair  Appetite:  Fair  Current Medications: Current Outpatient Medications  Medication Sig Dispense Refill  ALPRAZolam (XANAX) 1 MG tablet 1  qam  half  midday  half qhs 60 tablet 2   Ascorbic Acid (VITAMIN C) 1000 MG tablet Take 1,000 mg by mouth daily.     atorvastatin (LIPITOR) 40 MG tablet TAKE ONE TABLET BY MOUTH DAILY 90 tablet 1   Blood Glucose Monitoring Suppl (ONE TOUCH ULTRA 2) w/Device KIT Use TID 1 kit 2   Cholecalciferol (VITAMIN D3) 50 MCG (2000 UT) capsule Take 2,000 Units by mouth daily.     cyclobenzaprine (FLEXERIL) 10 MG tablet Take 10 mg by mouth 3 (three) times daily as needed for muscle spasms.     empagliflozin (JARDIANCE) 25 MG TABS tablet Take 1 tablet (25 mg total) by mouth daily. Via BI CARES pt assistance 90 tablet 1   furosemide (LASIX) 20 MG tablet TAKE ONE TABLET BY MOUTH EVERY MORNING 90 tablet 1   gabapentin (NEURONTIN) 300 MG capsule TAKE ONE CAPSULE BY MOUTH three times daily 270 capsule 1   glucose blood (COOL BLOOD GLUCOSE TEST STRIPS) test strip Use to test blood sugar TID. DX: E11.9 300 each 1   Lancets (ONETOUCH ULTRASOFT) lancets Use as instructed 100 each 12   magnesium oxide (MAG-OX) 400 MG tablet Take 1 tablet (400 mg total) by mouth 2 (two) times daily. WITH BREAKFAST AND EVENING MEALS. 180 tablet 3   metFORMIN (GLUCOPHAGE-XR) 500 MG 24 hr tablet TAKE TWO TABLETS BY MOUTH TWICE DAILY WITH food 260 tablet 0   metoprolol succinate (TOPROL-XL) 100 MG 24 hr tablet TAKE ONE TABLET BY MOUTH TWICE DAILY WITH OR immediately following A meal 180 tablet 1   Multiple Vitamin (MULTIVITAMIN) tablet Take 1 tablet by mouth daily.     nitroGLYCERIN (NITROSTAT) 0.4 MG SL tablet Place 1 tablet (0.4 mg total) under the tongue every 5 (five) minutes as needed for chest pain (x 3 doses). Reported on  05/08/2015 35 tablet 2   NOVOFINE PEN NEEDLE 32G X 6 MM MISC USE with insulin AS DIRECTED 100 each 3   PERCOCET 10-325 MG tablet Take 1 tablet by mouth 5 (five) times daily as needed.     potassium chloride SA (KLOR-CON) 20 MEQ tablet TAKE ONE TABLET BY MOUTH TWICE DAILY 180 tablet 1   Semaglutide, 2 MG/DOSE, (OZEMPIC, 2 MG/DOSE,) 8 MG/3ML SOPN Inject 2 mg into the skin once a week. 9 mL 1   spironolactone (ALDACTONE) 25 MG tablet TAKE ONE TABLET BY MOUTH ONCE DAILY 90 tablet 0   telmisartan (MICARDIS) 40 MG tablet TAKE ONE TABLET BY MOUTH daily 90 tablet 1   thiamine 100 MG tablet Take 1 tablet (100 mg total) by mouth daily. 90 tablet 1   TOUJEO MAX SOLOSTAR 300 UNIT/ML Solostar Pen INJECT 50 UNITS SUBCUTANEOUSLY DAILY 15 mL 5   warfarin (COUMADIN) 3 MG tablet TAKE 1 TABLET DAILY BY MOUTH EXCEPT TAKE 1/2 TABLET ON WEDNESDAYS OR AS DIRECTED BY ANTICOAGULATION CLINIC 90 tablet 2   No current facility-administered medications for this visit.    Lab Results:  No results found. However, due to the size of the patient record, not all encounters were searched. Please check Results Review for a complete set of results.  Physical Findings: AIMS:  , ,  ,  ,    CIWA:    COWS:     Musculoskeletal: Strength & Muscle Tone: within normal limits Gait & Station: normal Patient leans: Right  Psychiatric Specialty Exam: ROS  There were no vitals taken for this visit.There is no height  or weight on file to calculate BMI.  General Appearance: NA  Eye Contact::  Good  Speech:  Clear and Coherent  Volume:  Normal  Mood:  NA  Affect:  Congruent  Thought Process:  Coherent  Orientation:  Full (Time, Place, and Person)  Thought Content:  WDL  Suicidal Thoughts:  No  Homicidal Thoughts:  No  Memory:  NA  Judgement:  Good  Insight:  Good  Psychomotor Activity:  Normal  Concentration:  Good  Recall:  Good  Fund of Knowledge:Fair  Language: Good  Akathisia:  No  Handed:  Left  AIMS (if  indicated):     Assets:  Desire for Improvement  ADL's:  Intact  Cognition: WNL   At this time the patient's diagnosis is generalized anxiety disorder that is pretty much in remission.  At this time we will continue reducing her Xanax.  She will now take 1 mg pill 1 in the morning and half midday and a half at night.  We speak again in the next 2 to 3 months we will reduce it to a half a pill 3 times daily.  We will then make efforts to reduce that even further.  My goal is in the next 6 months is to have her off of Xanax.  I perfectly willing to give her something very mild for sleep.  This thinking things like melatonin and even Belsomra.  The patient is functioning fairly well.  But physically she is quite limited.

## 2021-02-23 NOTE — Chronic Care Management (AMB) (Signed)
Chronic Care Management    Clinical Social Work Note  02/23/2021 Name: Martisha Toulouse MRN: [id number] DOB: [birth year]  Kiyara Bouffard is a 75 y.o. year old female who is a primary care patient of [name], MD. The CCM team was consulted to assist the patient with chronic disease management and/or care coordination needs related to: [employer] , Mental Health Counseling and Resources, and Grief Counseling.   Engaged with patient by telephone for follow up visit in response to provider referral for social work chronic care management and care coordination services.   Consent to Services:  The patient was given information about Chronic Care Management services, agreed to services, and gave verbal consent prior to initiation of services.  Please see initial visit note for detailed documentation.   Patient agreed to services and consent obtained.   Assessment: Review of patient past medical history, allergies, medications, and health status, including review of relevant consultants reports was performed today as part of a comprehensive evaluation and provision of chronic care management and care coordination services.     SDOH (Social Determinants of Health) assessments and interventions performed:    Advanced Directives Status: Not addressed in this encounter.  CCM Care Plan  Allergies  Allergen Reactions   Metformin And Related Other (See Comments)    Must take XR form only, cannot tolerate Regular release metformin   Cleocin [Clindamycin Hcl] Diarrhea   Codeine Itching   Doxycycline Hyclate Diarrhea   Macrolides And Ketolides Diarrhea   Morphine Hives   Pentazocine Lactate Itching and Nausea And Vomiting    (GENERIC- [place])   Vibramycin [Doxycycline Calcium] Diarrhea and Rash   Clindamycin Diarrhea   Definity [Perflutren Lipid Microsphere] Other (See Comments)    Patient complained of warm sensation in chest x few seconds duration when injected  Definity.No other symptoms   Doxycycline Diarrhea   Metformin Other (See Comments)   Pentazocine Nausea Only   Perflutren Other (See Comments)   Perflutren Lipid Microspheres Other (See Comments)   Sulfa Antibiotics Diarrhea and Rash   Sulfonamide Derivatives Rash    Outpatient Encounter Medications as of 02/23/2021  Medication Sig   ALPRAZolam (XANAX) 1 MG tablet 1  qam   1   q3;00 PM  half  qhs   Ascorbic Acid (VITAMIN C) 1000 MG tablet Take 1,000 mg by mouth daily.   atorvastatin (LIPITOR) 40 MG tablet TAKE ONE TABLET BY MOUTH DAILY   Blood Glucose Monitoring Suppl (ONE TOUCH ULTRA 2) w/Device KIT Use TID   Cholecalciferol (VITAMIN D3) 50 MCG (2000 UT) capsule Take 2,000 Units by mouth daily.   cyclobenzaprine (FLEXERIL) 10 MG tablet Take 10 mg by mouth 3 (three) times daily as needed for muscle spasms.   empagliflozin (JARDIANCE) 25 MG TABS tablet Take 1 tablet (25 mg total) by mouth daily. Via BI CARES pt assistance   furosemide (LASIX) 20 MG tablet TAKE ONE TABLET BY MOUTH EVERY MORNING   gabapentin (NEURONTIN) 300 MG capsule TAKE ONE CAPSULE BY MOUTH three times daily   glucose blood (COOL BLOOD GLUCOSE TEST STRIPS) test strip Use to test blood sugar TID. DX: E11.9   Lancets (ONETOUCH ULTRASOFT) lancets Use as instructed   magnesium oxide (MAG-OX) 400 MG tablet Take 1 tablet (400 mg total) by mouth 2 (two) times daily. WITH BREAKFAST AND EVENING MEALS.   metFORMIN (GLUCOPHAGE-XR) 500 MG 24 hr tablet TAKE TWO TABLETS BY MOUTH TWICE DAILY WITH food   metoprolol succinate (TOPROL-XL) 100 MG  24 hr tablet TAKE ONE TABLET BY MOUTH TWICE DAILY WITH OR immediately following A meal   Multiple Vitamin (MULTIVITAMIN) tablet Take 1 tablet by mouth daily.   nitroGLYCERIN (NITROSTAT) 0.4 MG SL tablet Place 1 tablet (0.4 mg total) under the tongue every 5 (five) minutes as needed for chest pain (x 3 doses). Reported on 05/08/2015   NOVOFINE PEN NEEDLE 32G X 6 MM MISC USE with insulin AS DIRECTED    PERCOCET 10-325 MG tablet Take 1 tablet by mouth 5 (five) times daily as needed.   potassium chloride SA (KLOR-CON) 20 MEQ tablet TAKE ONE TABLET BY MOUTH TWICE DAILY   Semaglutide, 2 MG/DOSE, (OZEMPIC, 2 MG/DOSE,) 8 MG/3ML SOPN Inject 2 mg into the skin once a week.   spironolactone (ALDACTONE) 25 MG tablet TAKE ONE TABLET BY MOUTH ONCE DAILY   telmisartan (MICARDIS) 40 MG tablet TAKE ONE TABLET BY MOUTH daily   thiamine 100 MG tablet Take 1 tablet (100 mg total) by mouth daily.   TOUJEO MAX SOLOSTAR 300 UNIT/ML Solostar Pen INJECT 50 UNITS SUBCUTANEOUSLY DAILY   warfarin (COUMADIN) 3 MG tablet TAKE 1 TABLET DAILY BY MOUTH EXCEPT TAKE 1/2 TABLET ON WEDNESDAYS OR AS DIRECTED BY ANTICOAGULATION CLINIC   No facility-administered encounter medications on file as of 02/23/2021.    Patient Active Problem List   Diagnosis Date Noted   PAF (paroxysmal atrial fibrillation) ([place])    Poor dentition requiring referral to dentistry 03/26/2020   Thiamine deficiency 12/30/2018   Vitamin B12 deficiency anemia due to intrinsic factor deficiency 12/20/2018   CHF (congestive heart failure), NYHA class I, chronic, combined ([place]) 12/19/2018   Coronary artery disease involving native coronary artery of native heart with angina pectoris ([place]) 12/19/2018   Obesity with body mass index (BMI) of 30.0 to 39.9 01/15/2018   Chronic idiopathic constipation 12/27/2017   Type 2 diabetes mellitus ([place]) 10/30/2017   Eczema 08/07/2017   Insomnia secondary to chronic pain 08/04/2016   Visit for screening mammogram 04/14/2016   LV dysfunction    Routine general medical examination at a health care facility 05/03/2013   DM (diabetes mellitus), type 2 with peripheral vascular complications ([place]) 63/14/9702   PVC's/nonsustained VT 10/24/2012   H/O: CVA (cerebrovascular accident) 02/09/2012   Hypothyroidism 05/27/2011   Fibromyalgia 10/08/2010   Long term current use of anticoagulant 05/05/2010   Low back pain,  non-specific 05/12/2008   Cognitive dysfunction associated with depression 01/29/2007   Osteoarthritis 11/22/2006   Hyperlipidemia associated with type 2 diabetes mellitus ([place]) 09/13/2006   Coronary atherosclerosis 09/13/2006   GERD 09/13/2006   Hypertension associated with diabetes ([place]) 08/30/2006    Conditions to be addressed/monitored: Depression; Mental Health Concerns   Care Plan : LCSW Plan of Care  Updates made by Deirdre Peer, LCSW since 02/23/2021 12:00 AM     Problem: Lacks strategy for management of anxiety around sleeping and fear of dying.   Priority: High     Long-Range Goal: Establish and Maintain plan for anxiety management Completed 02/23/2021  Start Date: 04/09/2020  Expected End Date: 03/06/2021  This Visit's Progress: On track  Recent Progress: On track  Priority: High  Note:   Current Barriers:  Pt continues to have worry/fear and anxiety. Reports "afraid to go to sleep and die".  CSW encouraged pt to consider counseling support and she is now agreeable to referral being made- she will check with her insurance for copay/cost.  Pt acknowledges grief from the loss of her grandson- appears to  be coping in a normal/healthy manner Chronic Mental Health needs related to anxiety/PTSD - has participated in an initial visit with [facility] clinician and was not set up for follow up. Pt has decided to pause and does not plan on re-scheduling and/or connecting with new mental health therapist at this time. Limited social support, Mental Health Concerns , and Family and relationship dysfunction Suicidal Ideation/Homicidal Ideation: No Pt shares she is a bit overwhelmed by the pile of mail she has to go through and concerns for disposing of the   Interventions 02/23/21- Pt reports she is having a virtual appointment today with Dr Casimiro Needle. She has not noticed any issues since he reduced her Xanax from 10m to 265mrecently.  She is in positive place with accepting of things  including family dynamics and assured her she has the tools to stay grounded, aware and able to manage.  CSW discussed plans to sign off at this time. Pt agrees and acknowledges her growth and self value.   Offered her support and reassurance the Providers and team are still looking after her needs and to not hesitate to call if needed.    Solution-Focused Strategies, Mindfulness or RePsychologist, educationalActive listening / Reflection utilized , Emotional Supportive Provided, Problem Solving /Task Center , Psychoeducation for mental health needs , Motivational Interviewing, and Brief CBT  Patient Coping Strengths:  Spirituality Hopefulness Self Advocate Able to Communicate Effectively [place] and trust with her Psychiatrist and PCP Patient Self Care Deficits:  Lacks social connections Patient Goals:  -reconsider the counseling and maybe once monthly counseling visits - Continue with visits with  Dr PlCasimiro Needlend further discuss with him about resuming therapy if you feel needed. - practice relaxation or meditation  -continue with positive self talk, "don't sweat the small stuff", and allowing yourself to be more aware of your emotions and actions that come - reach out to CSW and/or Dr PlCasimiro Needlef needs arise and want to pursue counseling appointment before scheduled visit with Dr PlCasimiro Needle      Follow Up Plan: Client will reach out if needs arise.      JaEduard ClosSW, LCSW Licensed Clinical Social Worker LBLos [facility] Center [phone]

## 2021-03-02 ENCOUNTER — Telehealth: Payer: Self-pay

## 2021-03-02 DIAGNOSIS — Z7901 Long term (current) use of anticoagulants: Secondary | ICD-10-CM

## 2021-03-02 MED ORDER — WARFARIN SODIUM 3 MG PO TABS
ORAL_TABLET | ORAL | 2 refills | Status: DC
Start: 2021-03-02 — End: 2021-06-01

## 2021-03-02 NOTE — Telephone Encounter (Signed)
Pt called requesting refill of warfarin.  Pt is compliant with coumadin management and PCP apts.  Sent in refill.

## 2021-03-04 ENCOUNTER — Other Ambulatory Visit: Payer: Self-pay | Admitting: Internal Medicine

## 2021-03-04 ENCOUNTER — Telehealth: Payer: Self-pay

## 2021-03-04 ENCOUNTER — Ambulatory Visit: Payer: Medicare Other

## 2021-03-04 DIAGNOSIS — I5042 Chronic combined systolic (congestive) and diastolic (congestive) heart failure: Secondary | ICD-10-CM

## 2021-03-04 NOTE — Telephone Encounter (Signed)
Pt called reporting she had a fall about 1 hour ago and landed on her knee and side and needs to RS her coumadin clinic apt today at 3. She reports pain but no other symptoms. Pt denies hitting head. Pt reports she thinks she stepped on the inside of her pant leg. She reports she has pain medication which has helped her knee pain in the past. She reports she needs surgery on both knees.  Advised pt to report any swelling or abnormal bruising to the office. Advised pt of ER precautions. Apt was RS for next week. Pt verbalized understanding.

## 2021-03-06 DIAGNOSIS — Z794 Long term (current) use of insulin: Secondary | ICD-10-CM

## 2021-03-06 DIAGNOSIS — F32A Depression, unspecified: Secondary | ICD-10-CM

## 2021-03-06 DIAGNOSIS — I5042 Chronic combined systolic (congestive) and diastolic (congestive) heart failure: Secondary | ICD-10-CM | POA: Diagnosis not present

## 2021-03-06 DIAGNOSIS — F419 Anxiety disorder, unspecified: Secondary | ICD-10-CM

## 2021-03-06 DIAGNOSIS — E1149 Type 2 diabetes mellitus with other diabetic neurological complication: Secondary | ICD-10-CM

## 2021-03-10 DIAGNOSIS — Z79891 Long term (current) use of opiate analgesic: Secondary | ICD-10-CM | POA: Diagnosis not present

## 2021-03-10 DIAGNOSIS — M47816 Spondylosis without myelopathy or radiculopathy, lumbar region: Secondary | ICD-10-CM | POA: Diagnosis not present

## 2021-03-10 DIAGNOSIS — M150 Primary generalized (osteo)arthritis: Secondary | ICD-10-CM | POA: Diagnosis not present

## 2021-03-10 DIAGNOSIS — G894 Chronic pain syndrome: Secondary | ICD-10-CM | POA: Diagnosis not present

## 2021-03-11 ENCOUNTER — Other Ambulatory Visit: Payer: Self-pay

## 2021-03-11 ENCOUNTER — Ambulatory Visit (INDEPENDENT_AMBULATORY_CARE_PROVIDER_SITE_OTHER): Payer: Medicare Other

## 2021-03-11 DIAGNOSIS — D510 Vitamin B12 deficiency anemia due to intrinsic factor deficiency: Secondary | ICD-10-CM | POA: Diagnosis not present

## 2021-03-11 DIAGNOSIS — Z7901 Long term (current) use of anticoagulants: Secondary | ICD-10-CM | POA: Diagnosis not present

## 2021-03-11 LAB — POCT INR: INR: 1.4 — AB (ref 2.0–3.0)

## 2021-03-11 MED ORDER — CYANOCOBALAMIN 1000 MCG/ML IJ SOLN
1000.0000 ug | Freq: Once | INTRAMUSCULAR | Status: AC
  Administered 2021-03-11: 1000 ug via INTRAMUSCULAR

## 2021-03-11 NOTE — Progress Notes (Signed)
Increase dose today to take 1 1/2 tablets and increase dose tomorrow to take 1 1/2 tablet and then change weekly dose to take 1 tablet daily except take 1 1/2 tablets on Mondays and Thursday.  Re-check in 2 weeks.   Per orders of Dr. Ronnald Ramp, injection of B12 in L. Deltoid given by Randall An. Patient tolerated injection well.

## 2021-03-11 NOTE — Patient Instructions (Addendum)
Pre visit review using our clinic review tool, if applicable. No additional management support is needed unless otherwise documented below in the visit note.  Increase dose today to take 1 1/2 tablets and increase dose tomorrow to take 1 1/2 tablet and then change weekly dose to take 1 tablet daily except take 1 1/2 tablets on Mondays and Thursday.  Re-check in 2 weeks.

## 2021-03-17 ENCOUNTER — Encounter: Payer: Self-pay | Admitting: Internal Medicine

## 2021-03-17 ENCOUNTER — Other Ambulatory Visit: Payer: Self-pay

## 2021-03-17 ENCOUNTER — Ambulatory Visit (INDEPENDENT_AMBULATORY_CARE_PROVIDER_SITE_OTHER): Payer: Medicare Other | Admitting: Internal Medicine

## 2021-03-17 VITALS — BP 126/74 | HR 92 | Temp 98.3°F | Ht 61.0 in | Wt 191.0 lb

## 2021-03-17 DIAGNOSIS — E039 Hypothyroidism, unspecified: Secondary | ICD-10-CM

## 2021-03-17 DIAGNOSIS — Z1231 Encounter for screening mammogram for malignant neoplasm of breast: Secondary | ICD-10-CM

## 2021-03-17 DIAGNOSIS — I152 Hypertension secondary to endocrine disorders: Secondary | ICD-10-CM | POA: Diagnosis not present

## 2021-03-17 DIAGNOSIS — E1159 Type 2 diabetes mellitus with other circulatory complications: Secondary | ICD-10-CM | POA: Diagnosis not present

## 2021-03-17 DIAGNOSIS — I5042 Chronic combined systolic (congestive) and diastolic (congestive) heart failure: Secondary | ICD-10-CM

## 2021-03-17 DIAGNOSIS — E1151 Type 2 diabetes mellitus with diabetic peripheral angiopathy without gangrene: Secondary | ICD-10-CM

## 2021-03-17 DIAGNOSIS — E785 Hyperlipidemia, unspecified: Secondary | ICD-10-CM

## 2021-03-17 DIAGNOSIS — N1831 Chronic kidney disease, stage 3a: Secondary | ICD-10-CM | POA: Diagnosis not present

## 2021-03-17 DIAGNOSIS — E1169 Type 2 diabetes mellitus with other specified complication: Secondary | ICD-10-CM

## 2021-03-17 DIAGNOSIS — I2510 Atherosclerotic heart disease of native coronary artery without angina pectoris: Secondary | ICD-10-CM

## 2021-03-17 LAB — BASIC METABOLIC PANEL
BUN: 19 mg/dL (ref 6–23)
CO2: 22 mEq/L (ref 19–32)
Calcium: 10.2 mg/dL (ref 8.4–10.5)
Chloride: 104 mEq/L (ref 96–112)
Creatinine, Ser: 0.96 mg/dL (ref 0.40–1.20)
GFR: 58.81 mL/min — ABNORMAL LOW (ref >60.00)
Glucose, Bld: 137 mg/dL — ABNORMAL HIGH (ref 70–99)
Potassium: 4.3 mEq/L (ref 3.5–5.1)
Sodium: 138 mEq/L (ref 135–145)

## 2021-03-17 LAB — TSH: TSH: 3.99 u[IU]/mL (ref 0.35–5.50)

## 2021-03-17 LAB — HEMOGLOBIN A1C: Hgb A1c MFr Bld: 8.2 % — ABNORMAL HIGH (ref 4.6–6.5)

## 2021-03-17 MED ORDER — JARDIANCE 25 MG PO TABS: 25.0000 mg | ORAL_TABLET | Freq: Every day | ORAL | 1 refills | Status: DC

## 2021-03-17 NOTE — Patient Instructions (Signed)
Type 2 Diabetes Mellitus, Diagnosis, Adult ?Type 2 diabetes (type 2 diabetes mellitus) is a long-term, or chronic, disease. In type 2 diabetes, one or both of these problems may be present: ?The pancreas does not make enough of a hormone called insulin. ?Cells in the body do not respond properly to the insulin that the body makes (insulin resistance). ?Normally, insulin allows blood sugar (glucose) to enter cells in the body. The cells use glucose for energy. Insulin resistance or lack of insulin causes excess glucose to build up in the blood instead of going into cells. This causes high blood glucose (hyperglycemia).  ?What are the causes? ?The exact cause of type 2 diabetes is not known. ?What increases the risk? ?The following factors may make you more likely to develop this condition: ?Having a family member with type 2 diabetes. ?Being overweight or obese. ?Being inactive (sedentary). ?Having been diagnosed with insulin resistance. ?Having a history of prediabetes, diabetes when you were pregnant (gestational diabetes), or polycystic ovary syndrome (PCOS). ?What are the signs or symptoms? ?In the early stage of this condition, you may not have symptoms. Symptoms develop slowly and may include: ?Increased thirst or hunger. ?Increased urination. ?Unexplained weight loss. ?Tiredness (fatigue) or weakness. ?Vision changes, such as blurry vision. ?Dark patches on the skin. ?How is this diagnosed? ?This condition is diagnosed based on your symptoms, your medical history, a physical exam, and your blood glucose level. Your blood glucose may be checked with one or more of the following blood tests: ?A fasting blood glucose (FBG) test. You will not be allowed to eat (you will fast) for 8 hours or longer before a blood sample is taken. ?A random blood glucose test. This test checks blood glucose at any time of day regardless of when you ate. ?An A1C (hemoglobin A1C) blood test. This test provides information about blood  glucose levels over the previous 2-3 months. ?An oral glucose tolerance test (OGTT). This test measures your blood glucose at two times: ?After fasting. This is your baseline blood glucose level. ?Two hours after drinking a beverage that contains glucose. ?You may be diagnosed with type 2 diabetes if: ?Your fasting blood glucose level is 126 mg/dL (7.0 mmol/L) or higher. ?Your random blood glucose level is 200 mg/dL (11.1 mmol/L) or higher. ?Your A1C level is 6.5% or higher. ?Your oral glucose tolerance test result is higher than 200 mg/dL (11.1 mmol/L). ?These blood tests may be repeated to confirm your diagnosis. ?How is this treated? ?Your treatment may be managed by a specialist called an endocrinologist. Type 2 diabetes may be treated by following instructions from your health care provider about: ?Making dietary and lifestyle changes. These may include: ?Following a personalized nutrition plan that is developed by a registered dietitian. ?Exercising regularly. ?Finding ways to manage stress. ?Checking your blood glucose level as often as told. ?Taking diabetes medicines or insulin daily. This helps to keep your blood glucose levels in the healthy range. ?Taking medicines to help prevent complications from diabetes. Medicines may include: ?Aspirin. ?Medicine to lower cholesterol. ?Medicine to control blood pressure. ?Your health care provider will set treatment goals for you. Your goals will be based on your age, other medical conditions you have, and how you respond to diabetes treatment. Generally, the goal of treatment is to maintain the following blood glucose levels: ?Before meals: 80-130 mg/dL (4.4-7.2 mmol/L). ?After meals: below 180 mg/dL (10 mmol/L). ?A1C level: less than 7%. ?Follow these instructions at home: ?Questions to ask your health care provider ?  Consider asking the following questions: ?Should I meet with a certified diabetes care and education specialist? ?What diabetes medicines do I need,  and when should I take them? ?What equipment will I need to manage my diabetes at home? ?How often do I need to check my blood glucose? ?Where can I find a support group for people with diabetes? ?What number can I call if I have questions? ?When is my next appointment? ?General instructions ?Take over-the-counter and prescription medicines only as told by your health care provider. ?Keep all follow-up visits. This is important. ?Where to find more information ?For help and guidance and for more information about diabetes, please visit: ?American Diabetes Association (ADA): www.diabetes.org ?American Association of Diabetes Care and Education Specialists (ADCES): www.diabeteseducator.org ?International Diabetes Federation (IDF): www.idf.org ?Contact a health care provider if: ?Your blood glucose is at or above 240 mg/dL (13.3 mmol/L) for 2 days in a row. ?You have been sick or have had a fever for 2 days or longer, and you are not getting better. ?You have any of the following problems for more than 6 hours: ?You cannot eat or drink. ?You have nausea and vomiting. ?You have diarrhea. ?Get help right away if: ?You have severe hypoglycemia. This means your blood glucose is lower than 54 mg/dL (3.0 mmol/L). ?You become confused or you have trouble thinking clearly. ?You have difficulty breathing. ?You have moderate or large ketone levels in your urine. ?These symptoms may represent a serious problem that is an emergency. Do not wait to see if the symptoms will go away. Get medical help right away. Call your local emergency services (911 in the [place] Do not drive yourself to the hospital. ?Summary ?Type 2 diabetes mellitus is a long-term, or chronic, disease. In type 2 diabetes, the pancreas does not make enough of a hormone called insulin, or cells in the body do not respond properly to insulin that the body makes. ?This condition is treated by making dietary and lifestyle changes and taking diabetes medicines or  insulin. ?Your health care provider will set treatment goals for you. Your goals will be based on your age, other medical conditions you have, and how you respond to diabetes treatment. ?Keep all follow-up visits. This is important. ?This information is not intended to replace advice given to you by your health care provider. Make sure you discuss any questions you have with your health care provider. ?Document Revised: 05/18/2020 Document Reviewed: 05/18/2020 ?Elsevier Patient Education ? 2022 Elsevier Inc. ? ?

## 2021-03-17 NOTE — Progress Notes (Signed)
Subjective:  Patient ID: Kristen [name], female    DOB: 1945/04/06  Age: 76 y.o. MRN: [id number]  CC: Hypertension, Congestive Heart Failure, Diabetes, Annual Exam, and Hypothyroidism  This visit occurred during the SARS-CoV-2 public health emergency.  Safety protocols were in place, including screening questions prior to the visit, additional usage of staff PPE, and extensive cleaning of exam room while observing appropriate contact time as indicated for disinfecting solutions.    HPI Kristen [name] presents for f/up -   She has her baseline level of shortness of breath.  She walks and does not experience chest pain, exertional shortness of breath, diaphoresis, dizziness, lightheadedness, or edema.  Outpatient Medications Prior to Visit  Medication Sig Dispense Refill   ALPRAZolam (XANAX) 1 MG tablet 1  qam  half  midday  half qhs 60 tablet 2   Ascorbic Acid (VITAMIN C) 1000 MG tablet Take 1,000 mg by mouth daily.     atorvastatin (LIPITOR) 40 MG tablet TAKE ONE TABLET BY MOUTH DAILY 90 tablet 1   Blood Glucose Monitoring Suppl (ONE TOUCH ULTRA 2) w/Device KIT Use TID 1 kit 2   Cholecalciferol (VITAMIN D3) 50 MCG (2000 UT) capsule Take 2,000 Units by mouth daily.     cyclobenzaprine (FLEXERIL) 10 MG tablet Take 10 mg by mouth 3 (three) times daily as needed for muscle spasms.     furosemide (LASIX) 20 MG tablet TAKE ONE TABLET BY MOUTH EVERY MORNING 90 tablet 1   gabapentin (NEURONTIN) 300 MG capsule TAKE ONE CAPSULE BY MOUTH three times daily 270 capsule 1   glucose blood (COOL BLOOD GLUCOSE TEST STRIPS) test strip Use to test blood sugar TID. DX: E11.9 300 each 1   Lancets (ONETOUCH ULTRASOFT) lancets Use as instructed 100 each 12   magnesium oxide (MAG-OX) 400 MG tablet Take 1 tablet (400 mg total) by mouth 2 (two) times daily. WITH BREAKFAST AND EVENING MEALS. 180 tablet 3   metoprolol succinate (TOPROL-XL) 100 MG 24 hr tablet TAKE ONE TABLET BY MOUTH TWICE DAILY WITH OR  immediately following A meal 180 tablet 1   Multiple Vitamin (MULTIVITAMIN) tablet Take 1 tablet by mouth daily.     nitroGLYCERIN (NITROSTAT) 0.4 MG SL tablet Place 1 tablet (0.4 mg total) under the tongue every 5 (five) minutes as needed for chest pain (x 3 doses). Reported on 05/08/2015 35 tablet 2   NOVOFINE PEN NEEDLE 32G X 6 MM MISC USE with insulin AS DIRECTED 100 each 3   PERCOCET 10-325 MG tablet Take 1 tablet by mouth 5 (five) times daily as needed.     potassium chloride SA (KLOR-CON) 20 MEQ tablet TAKE ONE TABLET BY MOUTH TWICE DAILY 180 tablet 1   spironolactone (ALDACTONE) 25 MG tablet TAKE ONE TABLET BY MOUTH ONCE DAILY 90 tablet 0   telmisartan (MICARDIS) 40 MG tablet TAKE ONE TABLET BY MOUTH daily 90 tablet 1   thiamine 100 MG tablet Take 1 tablet (100 mg total) by mouth daily. 90 tablet 1   TOUJEO MAX SOLOSTAR 300 UNIT/ML Solostar Pen INJECT 50 UNITS SUBCUTANEOUSLY DAILY 15 mL 5   warfarin (COUMADIN) 3 MG tablet TAKE 1 TABLET DAILY BY MOUTH EXCEPT TAKE  1 AND 1/2 TABLETS ON MONDAYS AND THURSDAYS OR AS DIRECTED BY ANTICOAGULATION CLINIC 100 tablet 2   empagliflozin (JARDIANCE) 25 MG TABS tablet Take 1 tablet (25 mg total) by mouth daily. Via [employer] CARES pt assistance 90 tablet 1   metFORMIN (GLUCOPHAGE-XR) 500 MG 24 hr  tablet TAKE TWO TABLETS BY MOUTH TWICE DAILY WITH food 260 tablet 0   Semaglutide, 2 MG/DOSE, (OZEMPIC, 2 MG/DOSE,) 8 MG/3ML SOPN Inject 2 mg into the skin once a week. 9 mL 1   No facility-administered medications prior to visit.    ROS Review of Systems  Eyes:  Negative for visual disturbance.  Respiratory:  Positive for shortness of breath. Negative for cough, chest tightness and wheezing.   Cardiovascular:  Negative for chest pain, palpitations and leg swelling.  Gastrointestinal:  Negative for abdominal pain, constipation, diarrhea, nausea and vomiting.  Genitourinary: Negative.  Negative for difficulty urinating.  Musculoskeletal: Negative.  Negative for  arthralgias and myalgias.  Skin: Negative.  Negative for color change and pallor.  Neurological: Negative.  Negative for dizziness and weakness.  Hematological:  Negative for adenopathy. Does not bruise/bleed easily.  Psychiatric/Behavioral:  Negative for decreased concentration, dysphoric mood and sleep disturbance. The patient is nervous/anxious.    Objective:  BP 126/74 (BP Location: Right Arm, Patient Position: Sitting, Cuff Size: Large)    Pulse 92    Temp 98.3 F (36.8 C) (Oral)    Ht $R'5\' 1"'mA$  (1.549 m)    Wt 191 lb (86.6 kg)    SpO2 96%    BMI 36.09 kg/m   BP Readings from Last 3 Encounters:  03/17/21 126/74  09/21/20 132/74  05/13/20 110/72    Wt Readings from Last 3 Encounters:  03/17/21 191 lb (86.6 kg)  09/21/20 201 lb (91.2 kg)  05/13/20 208 lb 6.4 oz (94.5 kg)    Physical Exam Vitals reviewed.  Constitutional:      Appearance: She is obese.  HENT:     Nose: Nose normal.     Mouth/Throat:     Mouth: Mucous membranes are moist.  Eyes:     General: No scleral icterus.    Conjunctiva/sclera: Conjunctivae normal.  Cardiovascular:     Rate and Rhythm: Normal rate and regular rhythm.     Heart sounds: No murmur heard. Pulmonary:     Effort: Pulmonary effort is normal.     Breath sounds: No stridor. No wheezing, rhonchi or rales.  Abdominal:     General: Abdomen is protuberant. Bowel sounds are normal. There is no distension.     Palpations: Abdomen is soft. There is no hepatomegaly, splenomegaly or mass.     Tenderness: There is no abdominal tenderness.  Musculoskeletal:        General: Normal range of motion.     Cervical back: Neck supple.     Right lower leg: No edema.     Left lower leg: No edema.  Lymphadenopathy:     Cervical: No cervical adenopathy.  Skin:    General: Skin is warm and dry.  Neurological:     General: No focal deficit present.     Mental Status: She is alert.  Psychiatric:        Mood and Affect: Mood normal.        Behavior:  Behavior normal.    Lab Results  Component Value Date   WBC 6.0 09/21/2020   HGB 12.6 09/21/2020   HCT 39.4 09/21/2020   PLT 238.0 09/21/2020   GLUCOSE 137 (H) 03/17/2021   CHOL 98 09/21/2020   TRIG 126.0 09/21/2020   HDL 34.60 (L) 09/21/2020   LDLDIRECT 116.0 11/28/2008   LDLCALC 39 09/21/2020   ALT 14 09/21/2020   AST 18 09/21/2020   NA 138 03/17/2021   K 4.3 03/17/2021   CL  104 03/17/2021   CREATININE 0.96 03/17/2021   BUN 19 03/17/2021   CO2 22 03/17/2021   TSH 3.99 03/17/2021   INR 1.4 (A) 03/11/2021   HGBA1C 8.2 (H) 03/17/2021   MICROALBUR <0.7 09/21/2020    MM 3D SCREEN BREAST BILATERAL  Result Date: 12/27/2019 CLINICAL DATA:  Screening. EXAM: DIGITAL SCREENING BILATERAL MAMMOGRAM WITH TOMO AND CAD COMPARISON:  Previous exam(s). ACR Breast Density Category b: There are scattered areas of fibroglandular density. FINDINGS: There are no findings suspicious for malignancy. Images were processed with CAD. IMPRESSION: No mammographic evidence of malignancy. A result letter of this screening mammogram will be mailed directly to the patient. RECOMMENDATION: Screening mammogram in one year. (Code:[id number]) BI-RADS CATEGORY  1: Negative. Electronically Signed   By: Nolon Nations M.D.   On: 12/27/2019 13:16    Assessment & Plan:   Laysa was seen today for hypertension, congestive heart failure, diabetes, annual exam and hypothyroidism.  Diagnoses and all orders for this visit:  DM (diabetes mellitus), type 2 with peripheral vascular complications ([place])- Her A1c is a bit too high at 8.2%.  Will continue the combination of metformin, an SGLT2 inhibitor, and a GLP-1 agonist.  She will also work on her lifestyle modifications. -     Hemoglobin A1c; Future -     Hemoglobin A1c -     JARDIANCE 25 MG TABS tablet; Take 1 tablet (25 mg total) by mouth daily. Via [employer] CARES pt assistance -     Semaglutide, 2 MG/DOSE, (OZEMPIC, 2 MG/DOSE,) 8 MG/3ML SOPN; Inject 2 mg into the skin  once a week. -     metFORMIN (GLUCOPHAGE-XR) 500 MG 24 hr tablet; Take 3 tablets (1,500 mg total) by mouth daily with breakfast.  Acquired hypothyroidism- Her TSH is in the normal range.  She will stay on the current dosage of T4. -     TSH; Future -     TSH  Hypertension associated with diabetes ([place])- Her blood pressure is adequately well controlled. -     Basic metabolic panel; Future -     TSH; Future -     TSH -     Basic metabolic panel  CHF (congestive heart failure), NYHA class I, chronic, combined ([place])- She has a normal volume status. -     JARDIANCE 25 MG TABS tablet; Take 1 tablet (25 mg total) by mouth daily. Via [employer] CARES pt assistance  Hyperlipidemia associated with type 2 diabetes mellitus ([place])  Visit for screening mammogram  Atherosclerosis of native coronary artery of native heart without angina pectoris  Stage 3a chronic kidney disease ([place])- Her renal function is stable.   I have changed Kristen [name] "Pat"'s empagliflozin to [place]. I have also changed her metFORMIN. I am also having her maintain her Percocet, multivitamin, vitamin C, Vitamin D3, thiamine, onetouch ultrasoft, cyclobenzaprine, nitroGLYCERIN, magnesium oxide, ONE TOUCH ULTRA 2, Cool Blood Glucose Test Strips, Toujeo Max SoloStar, gabapentin, telmisartan, metoprolol succinate, atorvastatin, furosemide, potassium chloride SA, Novofine Pen Needle, ALPRAZolam, warfarin, spironolactone, and Ozempic (2 MG/DOSE).  Meds ordered this encounter  Medications   JARDIANCE 25 MG TABS tablet    Sig: Take 1 tablet (25 mg total) by mouth daily. Via [employer] CARES pt assistance    Dispense:  90 tablet    Refill:  1   Semaglutide, 2 MG/DOSE, (OZEMPIC, 2 MG/DOSE,) 8 MG/3ML SOPN    Sig: Inject 2 mg into the skin once a week.    Dispense:  9 mL  Refill:  1   metFORMIN (GLUCOPHAGE-XR) 500 MG 24 hr tablet    Sig: Take 3 tablets (1,500 mg total) by mouth daily with breakfast.    Dispense:  270 tablet     Refill:  1    This prescription was filled on 09/11/2020. Any refills authorized will be placed on file.     Follow-up: Return in about 6 months (around 09/14/2021).  Scarlette Calico, MD

## 2021-03-18 MED ORDER — OZEMPIC (2 MG/DOSE) 8 MG/3ML ~~LOC~~ SOPN: 2.0000 mg | PEN_INJECTOR | SUBCUTANEOUS | 1 refills | Status: DC

## 2021-03-18 MED ORDER — METFORMIN HCL ER 500 MG PO TB24: 1500.0000 mg | ORAL_TABLET | Freq: Every day | ORAL | 1 refills | Status: DC

## 2021-03-25 ENCOUNTER — Other Ambulatory Visit: Payer: Self-pay

## 2021-03-25 ENCOUNTER — Ambulatory Visit (INDEPENDENT_AMBULATORY_CARE_PROVIDER_SITE_OTHER): Payer: Medicare Other

## 2021-03-25 DIAGNOSIS — Z7901 Long term (current) use of anticoagulants: Secondary | ICD-10-CM

## 2021-03-25 LAB — POCT INR: INR: 3.1 — AB (ref 2.0–3.0)

## 2021-03-25 NOTE — Progress Notes (Signed)
Only take 1 tablet today and then continue 1 1/2 tabletsdaily except take 1 tablets on Mondays and Thursday.  Re-check in 4 weeks.

## 2021-03-25 NOTE — Patient Instructions (Addendum)
Pre visit review using our clinic review tool, if applicable. No additional management support is needed unless otherwise documented below in the visit note.  Only take 1 tablet today and then continue 1 1/2 tabletsdaily except take 1 tablets on Mondays and Thursday.  Re-check in 4 weeks.

## 2021-03-29 ENCOUNTER — Ambulatory Visit (INDEPENDENT_AMBULATORY_CARE_PROVIDER_SITE_OTHER): Payer: Medicare Other | Admitting: *Deleted

## 2021-03-29 DIAGNOSIS — I5042 Chronic combined systolic (congestive) and diastolic (congestive) heart failure: Secondary | ICD-10-CM

## 2021-03-29 DIAGNOSIS — I480 Paroxysmal atrial fibrillation: Secondary | ICD-10-CM

## 2021-03-29 DIAGNOSIS — E1151 Type 2 diabetes mellitus with diabetic peripheral angiopathy without gangrene: Secondary | ICD-10-CM

## 2021-03-29 NOTE — Patient Instructions (Signed)
Visit Information  Kristen [name], thank you for taking time to talk with me today. Please don't hesitate to contact me if I can be of assistance to you before our next scheduled telephone appointment.  Below are the goals we discussed today:  Patient Self-Care Activities: Patient Kristen [name] will: Take medications as prescribed Attend all scheduled provider appointments Call pharmacy for medication refills Call provider office for new concerns or questions Continue checking blood sugar twice daily and write down these values from home monitoring: check first thing in the morning before you eat and again 2 hours after a large meal Frequently review the information you have been sent about choices of food that are good to eat when you have diabetes, continue your efforts to begin making small changes to your soft diet to decrease you sugar and carbohydrate intake at home Try to follow a heart healthy, low salt, low carbohydrate and low sugar diet Continue to weigh daily and track weight on paper You reported today general weight ranges between 190-192 lbs Call cardiologist/ care providers if I gain more than 2 pounds in one day or 5 pounds in one week Watch for swelling in feet, ankles and legs every day   Keep up the great work preventing falls!  Our next scheduled telephone follow up visit/ appointment with care management team member is scheduled on:  Thursday, May 06, 2021 at 3:00 pm  If you need to cancel or re-schedule our visit, please call [phone] and our care guide team will be happy to assist you.   I look forward to hearing about your progress.   [name], RN, BSN, [place] [phone]: direct office  If you are experiencing a Mental Health or [place] or need someone to talk to, please  call the Suicide and Crisis Lifeline: 988 call the [place] National Suicide Prevention Lifeline: 812-004-8537 or TTY:  432-737-3414 TTY 339-522-8373) to talk to a trained counselor call 1-800-273-TALK (toll free, 24 hour hotline) go to [facility] [address] [place] [phone]) call 911   Patient verbalizes understanding of instructions and care plan provided today and agrees to view in [place]. Active MyChart status confirmed with patient  Diabetic Nephropathy Diabetic nephropathy is kidney disease that is caused by diabetes (diabetes mellitus). Kidneys are organs that filter and clean blood and get rid of body waste products and extra fluid. Diabetes can cause gradual kidney damage over many years. Diabetic nephropathy that continues to get worse can lead to kidney failure. What are the causes? This condition is caused by kidney damage from diabetes that is not well controlled with treatment. Having high blood sugar (glucose) for a long time because of diabetes can damage blood vessels in the kidneys and cause them to thicken and become scarred. Those changes prevent the kidneys from functioning normally. What increases the risk? This condition is more likely to develop in people with diabetes who: Have had diabetes for many years. Have high blood pressure. Have high blood glucose levels over a long period of time. Have a family history of kidney disease. Have a history of tobacco use. Have certain genes that are passed from parent to child (inherited). What are the signs or symptoms? This condition may not cause symptoms at first. If you do have symptoms, they may include: Swelling of your hands, feet, or ankles. Weakness. Poor appetite. Nausea. Confusion. Tiredness (fatigue). Trouble sleeping. Dry, itchy skin. If  nephropathy leads to kidney failure, symptoms may include: Vomiting. Shortness of breath. Jerky movements that you cannot control (seizure). Coma. How is this diagnosed? It is important to diagnose this condition before symptoms develop.  You may be screened for diabetic nephropathy at a routine health care visit. Screening tests may include: Urine tests. These may be done every year. Urine collection over a 24-hour period to measure kidney function. Blood tests to measure blood glucose levels and kidney function. Regular blood pressure monitoring. If your health care provider suspects diabetic nephropathy, he or she may: Review your medical history and symptoms. Do a physical exam. Do an ultrasound of your kidneys. Perform a procedure to take a sample of kidney tissue for testing (biopsy). How is this treated? The goal of treatment is to prevent or slow down any damage to your kidneys by managing your diabetes. To do this, it is important to control: Your blood pressure. Your target blood pressure may vary depending on your medical conditions, your age, and other factors. To help control blood pressure, you may be prescribed medicines to lower your blood pressure (ACE inhibitors) or to help your body get rid of excess fluid (diuretics). Your A1c (hemoglobin A1c) level. Generally, the goal of treatment is to maintain an A1c level of less than 7%. Your blood glucose level. Your blood lipids. If you have high cholesterol, you may need to take lipid-lowering drugs, such as statins. Other treatments may include: Medicines, including insulin injections. Lifestyle changes, such as losing weight, quitting smoking, or making changes to your diet. If your disease progresses to end-stage kidney failure, treatment may include: Dialysis. This is a procedure to filter your blood with a machine. Kidney transplant. Follow these instructions at home: Eating and drinking Eat healthy foods, and eat healthy snacks between meals. Follow instructions from your health care provider about eating and drinking restrictions. Limit your sodium (salt), protein, or fluid intake as directed. If you drink alcohol: Limit how much you use to: 0-1 drink  a day for nonpregnant women. 0-2 drinks a day for men. Be aware of how much alcohol is in your drink. In the [place] one drink equals one 12 oz bottle of beer (355 mL), one 5 oz glass of wine (148 mL), or one 1 oz glass of hard liquor (44 mL). Lifestyle Maintain a healthy weight. Work with your health care provider to lose weight, if needed. Do not use any products that contain nicotine or tobacco, such as cigarettes, e-cigarettes, and chewing tobacco. If you need help quitting, ask your health care provider. Be physically active every day. Ask your health care provider what type of exercise is best for you. Work with your health care provider to manage your blood pressure. General instructions   Follow your diabetes management plan as directed. Check your blood glucose levels as directed by your health care provider. Keep your blood glucose in your target range as directed by your health care provider. Have your A1c level checked two or more times a year, or as often as told by your health care provider. Measure your blood pressure regularly at home, as told by your health care provider. Take over-the-counter and prescription medicines only as told by your health care provider. These include insulin and supplements. Keep all follow-up visits and routine visits as told by your health care provider. This is important. Make sure you get screening tests as directed. Where to find more information American Diabetes Association: www.diabetes.org Contact a health care provider if:  You have trouble keeping your blood glucose in your goal range. Your blood glucose level is higher than 240 mg/dL (13.3 mmol/L) for 2 days in a row. You have swelling in your hands, ankles, or feet. You feel weak, tired, or dizzy. You have sudden muscle tightening (spasms). You have nausea or vomiting. You feel tired all the time. Get help right away if: You are very sleepy. You faint. You have: A seizure. Severe,  painful muscle spasms. Shortness of breath. Chest pain. Summary Diabetic nephropathy is kidney disease that is caused by diabetes (diabetes mellitus). Keep your blood sugar (glucose) in your target range as directed by your health care provider. Work with your health care provider to manage your blood pressure. Keep all follow-up visits and routine visits as told by your health care provider. This is important. Make sure you get screening tests as directed. This information is not intended to replace advice given to you by your health care provider. Make sure you discuss any questions you have with your health care provider. Document Revised: 08/06/2018 Document Reviewed: 08/06/2018 Elsevier Patient Education  [place].

## 2021-03-29 NOTE — Chronic Care Management (AMB) (Signed)
Chronic Care Management   CCM Kristen [name] Visit Note  03/29/2021 Name: Kristen [name] MRN: [id number] DOB: 1945/04/24  Subjective: Kristen [name] is a 76 y.o. year old female who is a primary care patient of [name], MD. The care management team was consulted for assistance with disease management and care coordination needs.    Engaged with patient by telephone for follow up visit in response to provider referral for case management and/or care coordination services.   Consent to Services:  The patient was given information about Chronic Care Management services, agreed to services, and gave verbal consent prior to initiation of services.  Please see initial visit note for detailed documentation.  Patient agreed to services and verbal consent obtained.   Assessment: Review of patient past medical history, allergies, medications, health status, including review of consultants reports, laboratory and other test data, was performed as part of comprehensive evaluation and provision of chronic care management services.  CCM Care Plan Allergies  Allergen Reactions   Metformin And Related Other (See Comments)    Must take XR form only, cannot tolerate Regular release metformin   Cleocin [Clindamycin Hcl] Diarrhea   Codeine Itching   Doxycycline Hyclate Diarrhea   Macrolides And Ketolides Diarrhea   Morphine Hives   Pentazocine Lactate Itching and Nausea And Vomiting    (GENERIC- [place])   Vibramycin [Doxycycline Calcium] Diarrhea and Rash   Clindamycin Diarrhea   Definity [Perflutren Lipid Microsphere] Other (See Comments)    Patient complained of warm sensation in chest x few seconds duration when injected Definity.No other symptoms   Doxycycline Diarrhea   Metformin Other (See Comments)   Pentazocine Nausea Only   Perflutren Other (See Comments)   Perflutren Lipid Microspheres Other (See Comments)   Sulfa Antibiotics Diarrhea and Rash   Sulfonamide Derivatives Rash    Outpatient Encounter Medications as of 03/29/2021  Medication Sig   ALPRAZolam (XANAX) 1 MG tablet 1  qam  half  midday  half qhs   Ascorbic Acid (VITAMIN C) 1000 MG tablet Take 1,000 mg by mouth daily.   atorvastatin (LIPITOR) 40 MG tablet TAKE ONE TABLET BY MOUTH DAILY   Blood Glucose Monitoring Suppl (ONE TOUCH ULTRA 2) w/Device KIT Use TID   Cholecalciferol (VITAMIN D3) 50 MCG (2000 UT) capsule Take 2,000 Units by mouth daily.   cyclobenzaprine (FLEXERIL) 10 MG tablet Take 10 mg by mouth 3 (three) times daily as needed for muscle spasms.   furosemide (LASIX) 20 MG tablet TAKE ONE TABLET BY MOUTH EVERY MORNING   gabapentin (NEURONTIN) 300 MG capsule TAKE ONE CAPSULE BY MOUTH three times daily   glucose blood (COOL BLOOD GLUCOSE TEST STRIPS) test strip Use to test blood sugar TID. DX: E11.9   JARDIANCE 25 MG TABS tablet Take 1 tablet (25 mg total) by mouth daily. Via BI CARES pt assistance   Lancets (ONETOUCH ULTRASOFT) lancets Use as instructed   magnesium oxide (MAG-OX) 400 MG tablet Take 1 tablet (400 mg total) by mouth 2 (two) times daily. WITH BREAKFAST AND EVENING MEALS.   metFORMIN (GLUCOPHAGE-XR) 500 MG 24 hr tablet Take 3 tablets (1,500 mg total) by mouth daily with breakfast.   metoprolol succinate (TOPROL-XL) 100 MG 24 hr tablet TAKE ONE TABLET BY MOUTH TWICE DAILY WITH OR immediately following A meal   Multiple Vitamin (MULTIVITAMIN) tablet Take 1 tablet by mouth daily.   nitroGLYCERIN (NITROSTAT) 0.4 MG SL tablet Place 1 tablet (0.4 mg total) under the tongue every 5 (five) minutes  as needed for chest pain (x 3 doses). Reported on 05/08/2015   NOVOFINE PEN NEEDLE 32G X 6 MM MISC USE with insulin AS DIRECTED   PERCOCET 10-325 MG tablet Take 1 tablet by mouth 5 (five) times daily as needed.   potassium chloride SA (KLOR-CON) 20 MEQ tablet TAKE ONE TABLET BY MOUTH TWICE DAILY   Semaglutide, 2 MG/DOSE, (OZEMPIC, 2 MG/DOSE,) 8 MG/3ML SOPN Inject 2 mg into the skin once a week.    spironolactone (ALDACTONE) 25 MG tablet TAKE ONE TABLET BY MOUTH ONCE DAILY   telmisartan (MICARDIS) 40 MG tablet TAKE ONE TABLET BY MOUTH daily   thiamine 100 MG tablet Take 1 tablet (100 mg total) by mouth daily.   TOUJEO MAX SOLOSTAR 300 UNIT/ML Solostar Pen INJECT 50 UNITS SUBCUTANEOUSLY DAILY   warfarin (COUMADIN) 3 MG tablet TAKE 1 TABLET DAILY BY MOUTH EXCEPT TAKE  1 AND 1/2 TABLETS ON MONDAYS AND THURSDAYS OR AS DIRECTED BY ANTICOAGULATION CLINIC   No facility-administered encounter medications on file as of 03/29/2021.   Patient Active Problem List   Diagnosis Date Noted   Stage 3a chronic kidney disease ([place]) 03/17/2021   PAF (paroxysmal atrial fibrillation) ([place])    Thiamine deficiency 12/30/2018   Vitamin B12 deficiency anemia due to intrinsic factor deficiency 12/20/2018   CHF (congestive heart failure), NYHA class I, chronic, combined ([place]) 12/19/2018   Coronary artery disease involving native coronary artery of native heart with angina pectoris ([place]) 12/19/2018   Obesity with body mass index (BMI) of 30.0 to 39.9 01/15/2018   Chronic idiopathic constipation 12/27/2017   Type 2 diabetes mellitus ([place]) 10/30/2017   Eczema 08/07/2017   Insomnia secondary to chronic pain 08/04/2016   Visit for screening mammogram 04/14/2016   LV dysfunction    Routine general medical examination at a health care facility 05/03/2013   DM (diabetes mellitus), type 2 with peripheral vascular complications ([place]) 46/80/3212   PVC's/nonsustained VT 10/24/2012   H/O: CVA (cerebrovascular accident) 02/09/2012   Hypothyroidism 05/27/2011   Fibromyalgia 10/08/2010   Long term current use of anticoagulant 05/05/2010   Low back pain, non-specific 05/12/2008   Cognitive dysfunction associated with depression 01/29/2007   Osteoarthritis 11/22/2006   Hyperlipidemia associated with type 2 diabetes mellitus ([place]) 09/13/2006   Coronary atherosclerosis 09/13/2006   GERD 09/13/2006   Hypertension associated  with diabetes ([place]) 08/30/2006   Conditions to be addressed/monitored:  CHF and DMII  Care Plan : Kristen [name] Care Manager Plan of Care  Updates made by Kristen Royalty, Kristen [name] since 03/29/2021 12:00 AM     Problem: Chronic Disease Management Needs   Priority: High     Long-Range Goal: Ongoing adherence to established plan of care for long term chronic disease management   Start Date: 01/07/2021  Expected End Date: 01/07/2022  Recent Progress: On track  Priority: High  Note:   Current Barriers:  Chronic Disease Management support and education needs related to CHF and DMII Unable to independently verbalize appropriate choices for DM soft diet- requires ongoing reinforcement / support Does not adhere to provider recommendations re: following low carb/ sugar diet Ongoing dental issues- unable to afford dental care: community resource care guide referral placed for same Chronic pain: followed regularly by pain management provider Mental health history: currently active with clinical psychiatry provider  RNCM Clinical Goal(s):  Patient will demonstrate Ongoing health management independence CHF; DMII  through collaboration with Kristen [name] Care manager, provider, and care team.   Interventions: 1:1 collaboration with primary care provider  regarding development and update of comprehensive plan of care as evidenced by provider attestation and co-signature Inter-disciplinary care team collaboration (see longitudinal plan of care) Evaluation of current treatment plan related to  self management and patient's adherence to plan as established by provider  Diabetes Interventions: GOAL status: 03/29/21: ON TRACK; Long Term goal  Provided education to patient about basic DM disease process Counseled on importance of regular laboratory monitoring as prescribed Discussed plans with patient for ongoing care management follow up and provided patient with direct contact information for care management team Reviewed  scheduled/upcoming provider appointments including: [place]; 04/30/21- cardiology provider;  Reviewed recent PCP office visit 03/17/21 with patient: confirmed for patient that Dr. Ronnald Ramp recommended decreasing metformin FROM 1000 mg BID to 1500 mg QD: patient reports she was unaware of this recommendation; I read notes to her verbatim from Dr. Ronnald Ramp' documentation: she is eventually agreeable to making this change Reviewed recent kidney function labs and discussed rationale for change in metformin dosing: she is scheduled to talk to [place] team 04/28/21- encouraged her to discuss medications as they relate to kidney function with [facility]CM pharmacy; she reports she will do Discussed CKD/ kidney function complications in setting of DMII Reviewed recent (new) A1-C result 03/17/21-- 8.2, this is slightly improved over last value of 8.6 (09/21/21) Confirmed patient continues taking Ozempic weekly, jardiance, and Toujeo insulin QD-- reports currently using 48 U Toujeo QD Discussed ongoing challenges following carbohydrate modified, low sugar diet because of her dental issues; however, she continues to "do best I can" to follow diabetic/ heart healthy diet; reports she has started eating smaller more frequent meals, which has been helping  Lab Results  Component Value Date   HGBA1C 8.2 (H) 03/17/2021  Heart Failure Interventions:  GOAL status: 03/29/21: ON TRACK; Long Term Goal Discussed importance of daily weight and advised patient to weigh and record daily Reviewed role of diuretics in prevention of fluid overload and management of heart failure; Discussed the importance of keeping all appointments with provider Reviewed with patient recent weights at home: she reports weight ranges consistently between 190-192 lbs: positive reinforcement provided  Reinforced previously provided education around signs/ symptoms yellow CHF zone-- she requires ongoing reinforcement and support; she denies signs/ symptoms  today, states "breathing is okay;" denies lower extremity swelling outside of normal/ baseline; tells me that her pulse oximetry readings at home "are always above 90, usually between 93-96%" Confirmed patient is aware of/ with plans to attend upcoming scheduled cardiology provider office visit 04/30/21  Patient Goals/Self-Care Activities: As evidenced by review of EHR and patient reporting during CCM Kristen [name] CM outreach,  Patient Fraser Din will: Take medications as prescribed Attend all scheduled provider appointments Call pharmacy for medication refills Call provider office for new concerns or questions Continue checking blood sugar twice daily and write down these values from home monitoring: check first thing in the morning before you eat and again 2 hours after a large meal Frequently review the information you have been sent about choices of food that are good to eat when you have diabetes, continue your efforts to begin making small changes to your soft diet to decrease you sugar and carbohydrate intake at home Try to follow a heart healthy, low salt, low carbohydrate and low sugar diet Continue to weigh daily and track weight on paper You reported today general weight ranges between 190-192 lbs Call cardiologist/ care providers if I gain more than 2 pounds in one day or 5 pounds in one  week Watch for swelling in feet, ankles and legs every day   Keep up the great work preventing falls!  Follow Up Plan:   Telephone follow up appointment with care management team member scheduled for:  Thursday, May 06, 2021 at 3:00 pm The patient has been provided with contact information for the care management team and has been advised to call with any health related questions or concerns      Plan: The patient has been provided with contact information for the care management team and has been advised to call with any health related questions or concerns  Kristen Rack, Kristen [name], BSN, [place] [phone]: direct office

## 2021-04-06 DIAGNOSIS — I480 Paroxysmal atrial fibrillation: Secondary | ICD-10-CM

## 2021-04-06 DIAGNOSIS — E1151 Type 2 diabetes mellitus with diabetic peripheral angiopathy without gangrene: Secondary | ICD-10-CM

## 2021-04-06 DIAGNOSIS — I5042 Chronic combined systolic (congestive) and diastolic (congestive) heart failure: Secondary | ICD-10-CM

## 2021-04-08 ENCOUNTER — Telehealth: Payer: Medicare Other

## 2021-04-22 ENCOUNTER — Other Ambulatory Visit: Payer: Self-pay

## 2021-04-22 ENCOUNTER — Ambulatory Visit (INDEPENDENT_AMBULATORY_CARE_PROVIDER_SITE_OTHER): Payer: Medicare Other

## 2021-04-22 DIAGNOSIS — Z7901 Long term (current) use of anticoagulants: Secondary | ICD-10-CM | POA: Diagnosis not present

## 2021-04-22 LAB — POCT INR: INR: 3.3 — AB (ref 2.0–3.0)

## 2021-04-22 NOTE — Patient Instructions (Addendum)
Pre visit review using our clinic review tool, if applicable. No additional management support is needed unless otherwise documented below in the visit note.  Only take 1/2 tablet today and then change weekly dose to take 1 tablet daily except take 1 1/2 tablets on Mondays, Wednesday, and Fridays. Re-check in 2 weeks.

## 2021-04-22 NOTE — Progress Notes (Addendum)
Only take 1/2 tablet today and then change weekly dose to take 1 tablet daily except take 1 1/2 tablets on Mondays, Wednesday, and Fridays. Re-check in 2 weeks.

## 2021-04-22 NOTE — Progress Notes (Signed)
Patient ID: Kristen [name], female   DOB: 1945-06-23, 76 y.o.   MRN: [id number]  Medical screening examination/treatment/procedure(s) were performed by non-physician practitioner and as supervising physician I was immediately available for consultation/collaboration.  I agree with above. Cathlean Cower, MD

## 2021-04-28 ENCOUNTER — Telehealth: Payer: Medicare Other

## 2021-04-30 ENCOUNTER — Encounter: Payer: Self-pay | Admitting: Cardiovascular Disease

## 2021-04-30 ENCOUNTER — Ambulatory Visit: Payer: Medicare Other | Admitting: Cardiovascular Disease

## 2021-04-30 ENCOUNTER — Other Ambulatory Visit: Payer: Self-pay

## 2021-04-30 VITALS — BP 110/74 | HR 92 | Ht 61.0 in | Wt 186.6 lb

## 2021-04-30 DIAGNOSIS — E782 Mixed hyperlipidemia: Secondary | ICD-10-CM | POA: Diagnosis not present

## 2021-04-30 DIAGNOSIS — R0602 Shortness of breath: Secondary | ICD-10-CM

## 2021-04-30 DIAGNOSIS — I2510 Atherosclerotic heart disease of native coronary artery without angina pectoris: Secondary | ICD-10-CM | POA: Diagnosis not present

## 2021-04-30 DIAGNOSIS — I480 Paroxysmal atrial fibrillation: Secondary | ICD-10-CM

## 2021-04-30 NOTE — Patient Instructions (Signed)
Medication Instructions:  ?Your physician recommends that you continue on your current medications as directed. Please refer to the Current Medication list given to you today. ? ?*If you need a refill on your cardiac medications before your next appointment, please call your pharmacy* ? ? ?Lab Work: ?NONE ?If you have labs (blood work) drawn today and your tests are completely normal, you will receive your results only by: ?MyChart Message (if you have MyChart) OR ?A paper copy in the mail ?If you have any lab test that is abnormal or we need to change your treatment, we will call you to review the results. ? ? ?Testing/Procedures: ?ECHO ?Your physician has requested that you have an echocardiogram. Echocardiography is a painless test that uses sound waves to create images of your heart. It provides your doctor with information about the size and shape of your heart and how well your heart?s chambers and valves are working. This procedure takes approximately one hour. There are no restrictions for this procedure. ? ? ? ?Follow-Up: ?At [facility], you and your health needs are our priority.  As part of our continuing mission to provide you with exceptional heart care, we have created designated Provider Care Teams.  These Care Teams include your primary Cardiologist (physician) and Advanced Practice Providers (APPs -  Physician Assistants and Nurse Practitioners) who all work together to provide you with the care you need, when you need it. ? ? ?Your next appointment:   ?6 month(s) ? ?The format for your next appointment:   ?In Person ? ?Provider:   ?Michael Cooper, MD   ? ?  ?

## 2021-04-30 NOTE — Progress Notes (Signed)
Cardiology Office Note:    Date:  04/30/2021   ID:  Kristen, [name] 08-16-1945, MRN [id number]  PCP:  Kristen Lima, MD   [facility] Providers Cardiologist:  Kristen Mocha, MD     Referring MD: Kristen Lima, MD   Chief Complaint  Patient presents with   Coronary Artery Disease    History of Present Illness:    Kristen [name] is a 76 y.o. female with a hx of coronary artery disease status post CABG in 2004.  Other medical problems include paroxysmal atrial fibrillation on warfarin, history of stroke, obesity, diabetes, hypertension, and left bundle branch block.  The patient is here alone today.  She notes some shortness of breath with activity.  Otherwise no recent symptoms of chest pain or pressure, orthopnea, PND, leg swelling.  She feels her heart racing sometimes.  No lightheadedness or syncope.  No other complaints.  She has lost weight since her last visit.  Reports poor appetite.  Otherwise feels well with no fevers, chills, or constitutional symptoms.  Past Medical History:  Diagnosis Date   Arthritis    Blood transfusion without reported diagnosis    Bursitis    CAD (coronary artery disease)    a. s/p CABG 2004.b. stable cath 2014 demonstrating stable CAD and continued patency of her [name] graft.   Chronic anticoagulation    on coumadin   DDD (degenerative disc disease), lumbar    Depression    Diabetes mellitus    Diastolic dysfunction    per echo in October 2012 with EF 50 to 55%   Fibromyalgia    GERD (gastroesophageal reflux disease) 10/23/2003   Headache(784.0)    Hyperlipidemia    Hypertension    Hypokalemia    LBBB (left bundle branch block)    Lumbar back pain    LV dysfunction    a. EF 45% by cath 2014. b. EF 50-55% by technically difficult echo in 08/2014.   Lymphadenitis    Morbid obesity ([place])    a. Sleep study negative for significant OSA in 11/2014.   PAF (paroxysmal atrial fibrillation) ([place])    Stroke ([place]) 2004    affected speech per pt    Past Surgical History:  Procedure Laterality Date   ABDOMINAL HYSTERECTOMY     ANGIOPLASTY  laminectomy   CHOLECYSTECTOMY     CORONARY ARTERY BYPASS GRAFT     [name] to LAD    KNEE ARTHROSCOPY     LEFT HEART CATHETERIZATION WITH CORONARY/GRAFT ANGIOGRAM  12/07/2012   Procedure: LEFT HEART CATHETERIZATION WITH Kristen [name];  Surgeon: Kristen Ohara, MD;  Location: [facility] CATH LAB;  Service: Cardiovascular;;   LUMBAR LAMINECTOMY     x3   SPHINCTEROTOMY     TONSILLECTOMY      Current Medications: Current Meds  Medication Sig   ALPRAZolam (XANAX) 1 MG tablet 1  qam  half  midday  half qhs   Ascorbic Acid (VITAMIN C) 1000 MG tablet Take 1,000 mg by mouth daily.   atorvastatin (LIPITOR) 40 MG tablet TAKE ONE TABLET BY MOUTH DAILY   Blood Glucose Monitoring Suppl (ONE TOUCH ULTRA 2) w/Device KIT Use TID   Cholecalciferol (VITAMIN D3) 50 MCG (2000 UT) capsule Take 2,000 Units by mouth daily.   cyclobenzaprine (FLEXERIL) 10 MG tablet Take 10 mg by mouth 3 (three) times daily as needed for muscle spasms.   furosemide (LASIX) 20 MG tablet TAKE ONE TABLET BY MOUTH EVERY MORNING   gabapentin (NEURONTIN)  300 MG capsule TAKE ONE CAPSULE BY MOUTH three times daily   glucose blood (COOL BLOOD GLUCOSE TEST STRIPS) test strip Use to test blood sugar TID. DX: E11.9   JARDIANCE 25 MG TABS tablet Take 1 tablet (25 mg total) by mouth daily. Via [employer]ES pt assistance   Lancets (ONETOUCH ULTRASOFT) lancets Use as instructed   magnesium oxide (MAG-OX) 400 MG tablet Take 1 tablet (400 mg total) by mouth 2 (two) times daily. WITH BREAKFAST AND EVENING MEALS.   metFORMIN (GLUCOPHAGE-XR) 500 MG 24 hr tablet Take 3 tablets (1,500 mg total) by mouth daily with breakfast.   metoprolol succinate (TOPROL-XL) 100 MG 24 hr tablet TAKE ONE TABLET BY MOUTH TWICE DAILY WITH OR immediately following A meal   Multiple Vitamin (MULTIVITAMIN) tablet Take 1 tablet by mouth daily.    nitroGLYCERIN (NITROSTAT) 0.4 MG SL tablet Place 1 tablet (0.4 mg total) under the tongue every 5 (five) minutes as needed for chest pain (x 3 doses). Reported on 05/08/2015   NOVOFINE PEN NEEDLE 32G X 6 MM MISC USE with insulin AS DIRECTED   PERCOCET 10-325 MG tablet Take 1 tablet by mouth 5 (five) times daily as needed.   potassium chloride SA (KLOR-CON) 20 MEQ tablet TAKE ONE TABLET BY MOUTH TWICE DAILY   Semaglutide, 2 MG/DOSE, (OZEMPIC, 2 MG/DOSE,) 8 MG/3ML SOPN Inject 2 mg into the skin once a week.   spironolactone (ALDACTONE) 25 MG tablet TAKE ONE TABLET BY MOUTH ONCE DAILY   telmisartan (MICARDIS) 40 MG tablet TAKE ONE TABLET BY MOUTH daily   thiamine 100 MG tablet Take 1 tablet (100 mg total) by mouth daily.   TOUJEO MAX SOLOSTAR 300 UNIT/ML Solostar Pen INJECT 50 UNITS SUBCUTANEOUSLY DAILY   warfarin (COUMADIN) 3 MG tablet TAKE 1 TABLET DAILY BY MOUTH EXCEPT TAKE  1 AND 1/2 TABLETS ON MONDAYS AND THURSDAYS OR AS DIRECTED BY ANTICOAGULATION CLINIC     Allergies:   Metformin and related, Cleocin [clindamycin hcl], Codeine, Doxycycline hyclate, Macrolides and ketolides, Morphine, Pentazocine lactate, Vibramycin [doxycycline calcium], Clindamycin, Definity [perflutren lipid microsphere], Doxycycline, Metformin, Pentazocine, Perflutren, Perflutren lipid microspheres, Sulfa antibiotics, and Sulfonamide derivatives   Social History   Socioeconomic History   Marital status: Married    Spouse name: Not on file   Number of children: Not on file   Years of education: Not on file   Highest education level: Not on file  Occupational History   Not on file  Tobacco Use   Smoking status: Former    Types: Cigarettes    Quit date: 03/07/2001    Years since quitting: 20.1   Smokeless tobacco: Never  Vaping Use   Vaping Use: Never used  Substance and Sexual Activity   Alcohol use: No   Drug use: No   Sexual activity: Not Currently  Other Topics Concern   Not on file  Social History Narrative    Lives locally, has help available if needed.   Social Determinants of Health   Financial Resource Strain: Medium Risk   Difficulty of Paying Living Expenses: Somewhat hard  Food Insecurity: No Food Insecurity   Worried About Charity fundraiser in the Last Year: Never true   Ran Out of Food in the Last Year: Never true  Transportation Needs: No Transportation Needs   Lack of Transportation (Medical): No   Lack of Transportation (Non-Medical): No  Physical Activity: Inactive   Days of Exercise per Week: 0 days   Minutes of Exercise per Session:  0 min  Stress: Not on file  Social Connections: Moderately Isolated   Frequency of Communication with Friends and Family: Twice a week   Frequency of Social Gatherings with Friends and Family: Twice a week   Attends Religious Services: Never   Marine scientist or Organizations: No   Attends Music therapist: Never   Marital Status: Married     Family History: The patient's family history includes Anxiety disorder in her maternal aunt; Arthritis in an other family member; Colon cancer in her paternal uncle; Depression in her sister; Diabetes in an other family member; Heart attack in her father; Heart disease in her father; Hypertension in an other family member; Kidney disease in an other family member; Stroke in her mother and another family member.  ROS:   Please see the history of present illness.    All other systems reviewed and are negative.  EKGs/Labs/Other Studies Reviewed:    EKG:  EKG is ordered today.  The ekg ordered today demonstrates normal sinus rhythm 92 bpm, left bundle branch block, no significant change from previous tracings.  Recent Labs: 09/21/2020: ALT 14; Hemoglobin 12.6; Platelets 238.0 03/17/2021: BUN 19; Creatinine, Ser 0.96; Potassium 4.3; Sodium 138; TSH 3.99  Recent Lipid Panel    Component Value Date/Time   CHOL 98 09/21/2020 1425   CHOL 108 12/26/2017 0758   TRIG 126.0 09/21/2020  1425   HDL 34.60 (L) 09/21/2020 1425   HDL 38 (L) 12/26/2017 0758   CHOLHDL 3 09/21/2020 1425   VLDL 25.2 09/21/2020 1425   LDLCALC 39 09/21/2020 1425   LDLCALC 48 12/26/2017 0758   LDLDIRECT 116.0 11/28/2008 1122     Risk Assessment/Calculations:    CHA2DS2-VASc Score = 5   This indicates a 7.2% annual risk of stroke. The patient's score is based upon: CHF History: 0 HTN History: 1 Diabetes History: 1 Stroke History: 0 Vascular Disease History: 1 Age Score: 1 Gender Score: 1          Physical Exam:    VS:  BP 110/74    Pulse 92    Ht 5' 1" (1.549 m)    Wt 186 lb 9.6 oz (84.6 kg)    SpO2 95%    BMI 35.26 kg/m     Wt Readings from Last 3 Encounters:  04/30/21 186 lb 9.6 oz (84.6 kg)  03/17/21 191 lb (86.6 kg)  09/21/20 201 lb (91.2 kg)     GEN:  Well nourished, well developed in no acute distress HEENT: Normal NECK: No JVD; No carotid bruits LYMPHATICS: No lymphadenopathy CARDIAC: RRR, no murmurs, rubs, gallops RESPIRATORY:  Clear to auscultation without rales, wheezing or rhonchi  ABDOMEN: Soft, non-tender, non-distended MUSCULOSKELETAL:  No edema; No deformity  SKIN: Warm and dry NEUROLOGIC:  Alert and oriented x 3 PSYCHIATRIC:  Normal affect   ASSESSMENT:    1. Coronary artery disease involving native coronary artery of native heart without angina pectoris   2. Shortness of breath   3. PAF (paroxysmal atrial fibrillation) ([place])   4. Mixed hyperlipidemia    PLAN:    In order of problems listed above:  The patient is stable with no symptoms of angina.  She is not on antiplatelet therapy because of chronic warfarin use.  Treated with metoprolol succinate. Recommend 2D echocardiogram.  Last echo in 2016 showed low normal LVEF of 50 to 55%.  Should be reassessed. Anticoagulated with warfarin with no bleeding problems.  Also on metoprolol succinate.  Appears to  be maintaining sinus rhythm on all recent office visits. Treated with atorvastatin.  Most recent  labs with a cholesterol of 98, HDL 34, LDL 39.  Transaminases normal and ALT of 14, normal           Medication Adjustments/Labs and Tests Ordered: Current medicines are reviewed at length with the patient today.  Concerns regarding medicines are outlined above.  Orders Placed This Encounter  Procedures   EKG 12-Lead   ECHOCARDIOGRAM COMPLETE   No orders of the defined types were placed in this encounter.   Patient Instructions  Medication Instructions:  Your physician recommends that you continue on your current medications as directed. Please refer to the Current Medication list given to you today.  *If you need a refill on your cardiac medications before your next appointment, please call your pharmacy*   Lab Work: NONE If you have labs (blood work) drawn today and your tests are completely normal, you will receive your results only by: [place] (if you have MyChart) OR A paper copy in the mail If you have any lab test that is abnormal or we need to change your treatment, we will call you to review the results.   Testing/Procedures: ECHO Your physician has requested that you have an echocardiogram. Echocardiography is a painless test that uses sound waves to create images of your heart. It provides your doctor with information about the size and shape of your heart and how well your hearts chambers and valves are working. This procedure takes approximately one hour. There are no restrictions for this procedure.    Follow-Up: At [facility], you and your health needs are our priority.  As part of our continuing mission to provide you with exceptional heart care, we have created designated Provider Care Teams.  These Care Teams include your primary Cardiologist (physician) and Advanced Practice Providers (APPs -  Physician Assistants and Nurse Practitioners) who all work together to provide you with the care you need, when you need it.  Your next appointment:   6  month(s)  The format for your next appointment:   In Person  Provider:   Sherren Mocha, MD         Signed, Kristen Mocha, MD  04/30/2021 5:27 PM    [place]

## 2021-05-05 DIAGNOSIS — G894 Chronic pain syndrome: Secondary | ICD-10-CM | POA: Diagnosis not present

## 2021-05-05 DIAGNOSIS — M47816 Spondylosis without myelopathy or radiculopathy, lumbar region: Secondary | ICD-10-CM | POA: Diagnosis not present

## 2021-05-05 DIAGNOSIS — M150 Primary generalized (osteo)arthritis: Secondary | ICD-10-CM | POA: Diagnosis not present

## 2021-05-05 DIAGNOSIS — Z79891 Long term (current) use of opiate analgesic: Secondary | ICD-10-CM | POA: Diagnosis not present

## 2021-05-06 ENCOUNTER — Ambulatory Visit (INDEPENDENT_AMBULATORY_CARE_PROVIDER_SITE_OTHER): Payer: Medicare Other | Admitting: *Deleted

## 2021-05-06 DIAGNOSIS — E1151 Type 2 diabetes mellitus with diabetic peripheral angiopathy without gangrene: Secondary | ICD-10-CM

## 2021-05-06 DIAGNOSIS — I5042 Chronic combined systolic (congestive) and diastolic (congestive) heart failure: Secondary | ICD-10-CM

## 2021-05-06 NOTE — Patient Instructions (Signed)
Visit Information ? ?Kristen [name], thank you for taking time to talk with me today. Please don't hesitate to contact me if I can be of assistance to you before our next scheduled telephone appointment ? ?Below are the goals we discussed today:  ?Patient Self-Care Activities: ?Patient Kristen [name] will: ?Take medications as prescribed ?Attend all scheduled provider appointments ?Call pharmacy for medication refills ?Call provider office for new concerns or questions ?Try to locate and start using your blood sugar meter so you can keep track of your progress at home ?Once you locate your blood sugar meter after your recent move, check blood sugar twice daily and write down these values from home monitoring: check first thing in the morning before you eat and again 2 hours after a large meal ?The goal for your fasting blood sugar (first thing in the morning, before eating) is: between 100-120 ?The goal for your 2-hours after-eating blood sugar is: between 160-180 ?Frequently review the information you have been sent about choices of food that are good to eat when you have diabetes, continue your efforts to begin making small changes to your soft diet to decrease you sugar and carbohydrate intake at home ?Try to follow a heart healthy, low salt, low carbohydrate and low sugar diet ?Continue to weigh daily and track weight on paper ?You reported today general weight ranges between 184-186 lbs ?Call cardiologist/ care providers if I gain more than 2 pounds in one day or 5 pounds in one week ?Watch for swelling in feet, ankles and legs every day   ?Keep up the great work preventing falls; continue to use your cane as needed ? ?Our next scheduled telephone follow up visit/ appointment is scheduled on:   Tuesday, Jul 13, 2021 at 3:00 pm- This is a PHONE CALL appointment ? ?If you need to cancel or re-schedule our visit, please call [phone] and our care guide team will be happy to assist you. ?  ?I look forward to hearing about your  progress. ?  ?[name], RN, BSN, CCRN Alumnus ?[place] ?[phone]: direct office ? ?If you are experiencing a Mental Health or [place] or need someone to talk to, please  ?call the Suicide and Crisis Lifeline: 988 ?call the [place] National Suicide Prevention Lifeline: 414-697-1422 or TTY: (845)085-1255 TTY 253-593-4875) to talk to a trained counselor ?call 1-800-273-TALK (toll free, 24 hour hotline) ?go to [facility] [address], [place] [phone]) ?call 911  ? ?Patient verbalizes understanding of instructions and care plan provided today and agrees to view in [place]. Active MyChart status confirmed with patient ? ?Echocardiogram ?An echocardiogram is a test that uses sound waves (ultrasound) to produce images of the heart. ?Images from an echocardiogram can provide important information about: ?Heart size and shape. ?The size and thickness and movement of your heart's walls. ?Heart muscle function and strength. ?Heart valve function or if you have stenosis. Stenosis is when the heart valves are too narrow. ?If blood is flowing backward through the heart valves (regurgitation). ?A tumor or infectious growth around the heart valves. ?Areas of heart muscle that are not working well because of poor blood flow or injury from a heart attack. ?Aneurysm detection. An aneurysm is a weak or damaged part of an artery wall. The wall bulges out from the normal force of blood pumping through the body. ?Tell a health care provider about: ?Any allergies you have. ?All medicines you are taking,  including vitamins, herbs, eye drops, creams, and over-the-counter medicines. ?Any blood disorders you have. ?Any surgeries you have had. ?Any medical conditions you have. ?Whether you are pregnant or may be pregnant. ?What are the risks? ?Generally, this is a safe test. However, problems may occur, including an  allergic reaction to dye (contrast) that may be used during the test. ?What happens before the test? ?No specific preparation is needed. You may eat and drink normally. ?What happens during the test? ? ?You will take off your clothes from the waist up and put on a hospital gown. ?Electrodes or electrocardiogram (ECG)patches may be placed on your chest. The electrodes or patches are then connected to a device that monitors your heart rate and rhythm. ?You will lie down on a table for an ultrasound exam. A gel will be applied to your chest to help sound waves pass through your skin. ?A handheld device, called a transducer, will be pressed against your chest and moved over your heart. The transducer produces sound waves that travel to your heart and bounce back (or "echo" back) to the transducer. These sound waves will be captured in real-time and changed into images of your heart that can be viewed on a video monitor. The images will be recorded on a computer and reviewed by your health care provider. ?You may be asked to change positions or hold your breath for a short time. This makes it easier to get different views or better views of your heart. ?In some cases, you may receive contrast through an IV in one of your veins. This can improve the quality of the pictures from your heart. ?The procedure may vary among health care providers and hospitals. ?What can I expect after the test? ?You may return to your normal, everyday life, including diet, activities, and medicines, unless your health care provider tells you not to do that. ?Follow these instructions at home: ?It is up to you to get the results of your test. Ask your health care provider, or the department that is doing the test, when your results will be ready. ?Keep all follow-up visits. This is important. ?Summary ?An echocardiogram is a test that uses sound waves (ultrasound) to produce images of the heart. ?Images from an echocardiogram can provide  important information about the size and shape of your heart, heart muscle function, heart valve function, and other possible heart problems. ?You do not need to do anything to prepare before this test. You may eat and drink normally. ?After the echocardiogram is completed, you may return to your normal, everyday life, unless your health care provider tells you not to do that. ?This information is not intended to replace advice given to you by your health care provider. Make sure you discuss any questions you have with your health care provider. ?Document Revised: 11/04/2020 Document Reviewed: 10/15/2019 ?Elsevier Patient Education ? 2022 [employer]. ?  ? ? ?

## 2021-05-06 NOTE — Chronic Care Management (AMB) (Signed)
Chronic Care Management   CCM RN Visit Note  05/06/2021 Name: Kristen [name] MRN: [id number] DOB: 16-Apr-1945  Subjective: Kristen [name] is a 76 y.o. year old female who is a primary care patient of [name], MD. The care management team was consulted for assistance with disease management and care coordination needs.    Engaged with patient by telephone for follow up visit in response to provider referral for case management and/or care coordination services.   Consent to Services:  The patient was given information about Chronic Care Management services, agreed to services, and gave verbal consent prior to initiation of services.  Please see initial visit note for detailed documentation.  Patient agreed to services and verbal consent obtained.   Assessment: Review of patient past medical history, allergies, medications, health status, including review of consultants reports, laboratory and other test data, was performed as part of comprehensive evaluation and provision of chronic care management services.   SDOH (Social Determinants of Health) assessments and interventions performed:  SDOH Interventions    Flowsheet Row Most Recent Value  SDOH Interventions   Food Insecurity Interventions Intervention Not Indicated  [Denies food insecurity]  Housing Interventions Intervention Not Indicated  [Reports recently moved into new apartment,  is on first level,  continues to reside with husband]  Transportation Interventions Intervention Not Indicated  [Reports husband provides transportation]      CCM Care Plan  Allergies  Allergen Reactions   Metformin And Related Other (See Comments)    Must take XR form only, cannot tolerate Regular release metformin   Cleocin [Clindamycin Hcl] Diarrhea   Codeine Itching   Doxycycline Hyclate Diarrhea   Macrolides And Ketolides Diarrhea   Morphine Hives   Pentazocine Lactate Itching and Nausea And Vomiting    (GENERIC- [place])    Vibramycin [Doxycycline Calcium] Diarrhea and Rash   Clindamycin Diarrhea   Definity [Perflutren Lipid Microsphere] Other (See Comments)    Patient complained of warm sensation in chest x few seconds duration when injected Definity.No other symptoms   Doxycycline Diarrhea   Metformin Other (See Comments)   Pentazocine Nausea Only   Perflutren Other (See Comments)   Perflutren Lipid Microspheres Other (See Comments)   Sulfa Antibiotics Diarrhea and Rash   Sulfonamide Derivatives Rash   Outpatient Encounter Medications as of 05/06/2021  Medication Sig   ALPRAZolam (XANAX) 1 MG tablet 1  qam  half  midday  half qhs   Ascorbic Acid (VITAMIN C) 1000 MG tablet Take 1,000 mg by mouth daily.   atorvastatin (LIPITOR) 40 MG tablet TAKE ONE TABLET BY MOUTH DAILY   Blood Glucose Monitoring Suppl (ONE TOUCH ULTRA 2) w/Device KIT Use TID   Cholecalciferol (VITAMIN D3) 50 MCG (2000 UT) capsule Take 2,000 Units by mouth daily.   cyclobenzaprine (FLEXERIL) 10 MG tablet Take 10 mg by mouth 3 (three) times daily as needed for muscle spasms.   furosemide (LASIX) 20 MG tablet TAKE ONE TABLET BY MOUTH EVERY MORNING   gabapentin (NEURONTIN) 300 MG capsule TAKE ONE CAPSULE BY MOUTH three times daily   glucose blood (COOL BLOOD GLUCOSE TEST STRIPS) test strip Use to test blood sugar TID. DX: E11.9   JARDIANCE 25 MG TABS tablet Take 1 tablet (25 mg total) by mouth daily. Via BI CARES pt assistance   Lancets (ONETOUCH ULTRASOFT) lancets Use as instructed   magnesium oxide (MAG-OX) 400 MG tablet Take 1 tablet (400 mg total) by mouth 2 (two) times daily. WITH BREAKFAST AND EVENING  MEALS.   metFORMIN (GLUCOPHAGE-XR) 500 MG 24 hr tablet Take 3 tablets (1,500 mg total) by mouth daily with breakfast.   metoprolol succinate (TOPROL-XL) 100 MG 24 hr tablet TAKE ONE TABLET BY MOUTH TWICE DAILY WITH OR immediately following A meal   Multiple Vitamin (MULTIVITAMIN) tablet Take 1 tablet by mouth daily.   nitroGLYCERIN  (NITROSTAT) 0.4 MG SL tablet Place 1 tablet (0.4 mg total) under the tongue every 5 (five) minutes as needed for chest pain (x 3 doses). Reported on 05/08/2015   NOVOFINE PEN NEEDLE 32G X 6 MM MISC USE with insulin AS DIRECTED   PERCOCET 10-325 MG tablet Take 1 tablet by mouth 5 (five) times daily as needed.   potassium chloride SA (KLOR-CON) 20 MEQ tablet TAKE ONE TABLET BY MOUTH TWICE DAILY   Semaglutide, 2 MG/DOSE, (OZEMPIC, 2 MG/DOSE,) 8 MG/3ML SOPN Inject 2 mg into the skin once a week.   spironolactone (ALDACTONE) 25 MG tablet TAKE ONE TABLET BY MOUTH ONCE DAILY   telmisartan (MICARDIS) 40 MG tablet TAKE ONE TABLET BY MOUTH daily   thiamine 100 MG tablet Take 1 tablet (100 mg total) by mouth daily.   TOUJEO MAX SOLOSTAR 300 UNIT/ML Solostar Pen INJECT 50 UNITS SUBCUTANEOUSLY DAILY   warfarin (COUMADIN) 3 MG tablet TAKE 1 TABLET DAILY BY MOUTH EXCEPT TAKE  1 AND 1/2 TABLETS ON MONDAYS AND THURSDAYS OR AS DIRECTED BY ANTICOAGULATION CLINIC   No facility-administered encounter medications on file as of 05/06/2021.   Patient Active Problem List   Diagnosis Date Noted   Stage 3a chronic kidney disease ([place]) 03/17/2021   PAF (paroxysmal atrial fibrillation) ([place])    Thiamine deficiency 12/30/2018   Vitamin B12 deficiency anemia due to intrinsic factor deficiency 12/20/2018   CHF (congestive heart failure), NYHA class I, chronic, combined ([place]) 12/19/2018   Coronary artery disease involving native coronary artery of native heart with angina pectoris ([place]) 12/19/2018   Obesity with body mass index (BMI) of 30.0 to 39.9 01/15/2018   Chronic idiopathic constipation 12/27/2017   Type 2 diabetes mellitus ([place]) 10/30/2017   Eczema 08/07/2017   Insomnia secondary to chronic pain 08/04/2016   Visit for screening mammogram 04/14/2016   LV dysfunction    Routine general medical examination at a health care facility 05/03/2013   DM (diabetes mellitus), type 2 with peripheral vascular complications ([place])  15/72/6203   PVC's/nonsustained VT 10/24/2012   H/O: CVA (cerebrovascular accident) 02/09/2012   Hypothyroidism 05/27/2011   Fibromyalgia 10/08/2010   Long term current use of anticoagulant 05/05/2010   Low back pain, non-specific 05/12/2008   Cognitive dysfunction associated with depression 01/29/2007   Osteoarthritis 11/22/2006   Hyperlipidemia associated with type 2 diabetes mellitus ([place]) 09/13/2006   Coronary atherosclerosis 09/13/2006   GERD 09/13/2006   Hypertension associated with diabetes ([place]) 08/30/2006   Conditions to be addressed/monitored:  CHF and DMII  Care Plan : RN Care Manager Plan of Care  Updates made by Knox Royalty, RN since 05/06/2021 12:00 AM     Problem: Chronic Disease Management Needs   Priority: High     Long-Range Goal: Ongoing adherence to established plan of care for long term chronic disease management   Start Date: 01/07/2021  Expected End Date: 01/07/2022  Recent Progress: On track  Priority: High  Note:   Current Barriers:  Chronic Disease Management support and education needs related to CHF and DMII Unable to independently verbalize appropriate choices for DM soft diet- requires ongoing reinforcement / support Does not  consistently adhere to provider recommendations re: following low carb/ sugar diet Ongoing dental issues- unable to afford dental care: community resource care guide referral placed for same Chronic pain: followed regularly by pain management provider Mental health history: currently active with clinical psychiatry provider  RNCM Clinical Goal(s):  Patient will demonstrate Ongoing health management independence CHF; DMII  through collaboration with RN Care manager, provider, and care team.   Interventions: 1:1 collaboration with primary care provider regarding development and update of comprehensive plan of care as evidenced by provider attestation and co-signature Inter-disciplinary care team collaboration (see  longitudinal plan of care) Evaluation of current treatment plan related to  self management and patient's adherence to plan as established by provider 05/06/21: SDOH updated: no new/ unmet needs identified Falls assessment updated: reports one recent fall "about a month or two ago;" without injury; continues to use cane as needed; previously provided education around fall risks/ prevention reinforced Pain assessment updated: continues to report ongoing chronic back pain; established with pain management clinic provider ("Dr. Hardin Negus"); reports last appointment with pain management provider "yesterday;" denies quantification of pain level today; reports ongoing good pain management with currently prescribed medications by pain management clinic Reviewed recent cardiology provider office visit 04/30/21- verbalizes good understanding of post-office visit instructions Reviewed upcoming scheduled provider appointments: 05/13/21- ECHOcardiogram; 07/02/21- CCM pharmacy team; 11/01/21- cardiology provider; reminded patient that PCP would like for her to schedule follow up appointment 6 months from  last office visit (03/17/21)-- reminded patient this appointment will be due in mid- July: she reports she will schedule accordingly  Diabetes Interventions: GOAL status: 05/05/21: ON TRACK; Long Term goal  Provided education to patient about basic DM disease process Counseled on importance of regular laboratory monitoring as prescribed Discussed plans with patient for ongoing care management follow up and provided patient with direct contact information for care management team Patient tells me she has recently moved and her glucometer has been packed in a box; she has been unable to monitor blood sugars: encouraged her to try to locate so she can resume monitoring at home and stay on track Reinforced most recent progress with A1-C result from 03/17/21-- 8.2, this is slightly improved over last value of 8.6 (09/21/21)-  discussed best way to stay on track in between A1-C updates is home monitoring: she has made progress and will benefit from resumption of blood sugar monitoring at home Confirmed patient continues taking Ozempic weekly, jardiance, and Toujeo insulin QD-- reports currently using 48 U Toujeo QD Discussed ongoing challenges following carbohydrate modified, low sugar diet because of her dental issues; however, she continues to "do best I can" to follow diabetic/ heart healthy diet; reports she has started eating smaller more frequent meals, which has been helping-- in fact, she has also noted weight loss from previously reported weights in 190-192 lbs range to 186-188 lbs range: positive reinforcement provided with encouragement to continue efforts  Lab Results  Component Value Date   HGBA1C 8.2 (H) 03/17/2021  Heart Failure Interventions:  GOAL status: 05/05/21: ON TRACK; Long Term Goal Discussed importance of daily weight and advised patient to weigh and record daily Discussed the importance of keeping all appointments with provider Reviewed with patient recent weights at home: she reports weight ranges consistently between 186-188 lbs: positive reinforcement provided  Reinforced previously provided education around signs/ symptoms yellow CHF zone-- she requires ongoing reinforcement and support; she denies signs/ symptoms today, states "breathing is okay;" denies lower extremity swelling outside of normal/ baseline; again  tells me that her pulse oximetry readings at home "are always above 90, usually between 93-96%" Confirmed patient is aware of/ with plans to attend upcoming scheduled ECHOcardiogram: we discussed purpose of this test; she verbalizes good understanding of same and denies questions  Patient Goals/Self-Care Activities: As evidenced by review of EHR and patient reporting during CCM RN CM outreach,  Patient Fraser Din will: Take medications as prescribed Attend all scheduled provider  appointments Call pharmacy for medication refills Call provider office for new concerns or questions Try to locate and start using your blood sugar meter so you can keep track of your progress at home Once you locate your blood sugar meter after your recent move, check blood sugar twice daily and write down these values from home monitoring: check first thing in the morning before you eat and again 2 hours after a large meal The goal for your fasting blood sugar (first thing in the morning, before eating) is: between 100-120 The goal for your 2-hours after-eating blood sugar is: between 160-180 Frequently review the information you have been sent about choices of food that are good to eat when you have diabetes, continue your efforts to begin making small changes to your soft diet to decrease you sugar and carbohydrate intake at home Try to follow a heart healthy, low salt, low carbohydrate and low sugar diet Continue to weigh daily and track weight on paper You reported today general weight ranges between 184-186 lbs Call cardiologist/ care providers if I gain more than 2 pounds in one day or 5 pounds in one week Watch for swelling in feet, ankles and legs every day   Keep up the great work preventing falls; continue to use your cane as needed  Follow Up Plan:   Telephone follow up appointment with care management team member scheduled for:  Tuesday, Jul 13, 2021 at 3:00 pm The patient has been provided with contact information for the care management team and has been advised to call with any health related questions or concerns      Plan: The patient has been provided with contact information for the care management team and has been advised to call with any health related questions or concerns  [name], RN, BSN, [facility] RN Care Coordination- [place] [phone]: direct office

## 2021-05-07 ENCOUNTER — Other Ambulatory Visit: Payer: Self-pay

## 2021-05-07 ENCOUNTER — Ambulatory Visit (INDEPENDENT_AMBULATORY_CARE_PROVIDER_SITE_OTHER): Payer: Medicare Other

## 2021-05-07 DIAGNOSIS — D510 Vitamin B12 deficiency anemia due to intrinsic factor deficiency: Secondary | ICD-10-CM

## 2021-05-07 DIAGNOSIS — Z7901 Long term (current) use of anticoagulants: Secondary | ICD-10-CM | POA: Diagnosis not present

## 2021-05-07 LAB — POCT INR: INR: 2.9 (ref 2.0–3.0)

## 2021-05-07 MED ORDER — CYANOCOBALAMIN 1000 MCG/ML IJ SOLN
1000.0000 ug | Freq: Once | INTRAMUSCULAR | Status: AC
  Administered 2021-05-07: 1000 ug via INTRAMUSCULAR

## 2021-05-07 NOTE — Progress Notes (Addendum)
Continue 1 tablet daily except take 1 1/2 tablets on Mondays, Wednesday, and Fridays. Re-check in 3 weeks.  ? ?Per orders of Dr. Sharlet Salina, injection of B12 in L deltoid per pt request given by Randall An. ?Patient tolerated injection well. ? ?

## 2021-05-07 NOTE — Progress Notes (Signed)
Medical treatment/procedure(s) were performed by non-physician practitioner and as supervising physician I was immediately available for consultation/collaboration. I agree with above. Ewald Beg A Jazmyn Offner, MD  

## 2021-05-07 NOTE — Patient Instructions (Addendum)
Pre visit review using our clinic review tool, if applicable. No additional management support is needed unless otherwise documented below in the visit note. ? ?Continue 1 tablet daily except take 1 1/2 tablets on Mondays, Wednesday, and Fridays. Re-check in 3 weeks.  ?

## 2021-05-13 ENCOUNTER — Other Ambulatory Visit: Payer: Self-pay | Admitting: Internal Medicine

## 2021-05-13 ENCOUNTER — Other Ambulatory Visit: Payer: Self-pay

## 2021-05-13 ENCOUNTER — Ambulatory Visit (HOSPITAL_COMMUNITY): Payer: Medicare Other | Attending: Internal Medicine

## 2021-05-13 DIAGNOSIS — R0602 Shortness of breath: Secondary | ICD-10-CM | POA: Insufficient documentation

## 2021-05-13 DIAGNOSIS — E1151 Type 2 diabetes mellitus with diabetic peripheral angiopathy without gangrene: Secondary | ICD-10-CM

## 2021-05-13 DIAGNOSIS — I2510 Atherosclerotic heart disease of native coronary artery without angina pectoris: Secondary | ICD-10-CM

## 2021-05-13 DIAGNOSIS — E1159 Type 2 diabetes mellitus with other circulatory complications: Secondary | ICD-10-CM

## 2021-05-13 DIAGNOSIS — I152 Hypertension secondary to endocrine disorders: Secondary | ICD-10-CM

## 2021-05-13 LAB — ECHOCARDIOGRAM COMPLETE
Area-P 1/2: 6.54 cm2
S' Lateral: 2.9 cm

## 2021-05-13 MED ORDER — PERFLUTREN LIPID MICROSPHERE
1.0000 mL | INTRAVENOUS | Status: AC | PRN
  Administered 2021-05-13: 2 mL via INTRAVENOUS

## 2021-05-18 ENCOUNTER — Encounter: Payer: Self-pay | Admitting: Cardiovascular Disease

## 2021-05-22 ENCOUNTER — Other Ambulatory Visit: Payer: Self-pay | Admitting: Cardiovascular Disease

## 2021-05-23 ENCOUNTER — Other Ambulatory Visit: Payer: Self-pay | Admitting: Internal Medicine

## 2021-05-23 ENCOUNTER — Other Ambulatory Visit (HOSPITAL_COMMUNITY): Payer: Self-pay | Admitting: Psychiatry

## 2021-05-23 DIAGNOSIS — I5042 Chronic combined systolic (congestive) and diastolic (congestive) heart failure: Secondary | ICD-10-CM

## 2021-05-24 ENCOUNTER — Ambulatory Visit (INDEPENDENT_AMBULATORY_CARE_PROVIDER_SITE_OTHER): Payer: Medicare Other

## 2021-05-24 ENCOUNTER — Telehealth: Payer: Self-pay

## 2021-05-24 DIAGNOSIS — I2510 Atherosclerotic heart disease of native coronary artery without angina pectoris: Secondary | ICD-10-CM

## 2021-05-24 DIAGNOSIS — I10 Essential (primary) hypertension: Secondary | ICD-10-CM

## 2021-05-24 DIAGNOSIS — E782 Mixed hyperlipidemia: Secondary | ICD-10-CM

## 2021-05-24 DIAGNOSIS — I480 Paroxysmal atrial fibrillation: Secondary | ICD-10-CM

## 2021-05-24 DIAGNOSIS — Z79899 Other long term (current) drug therapy: Secondary | ICD-10-CM

## 2021-05-24 MED ORDER — ENTRESTO 24-26 MG PO TABS: 1.0000 | ORAL_TABLET | Freq: Two times a day (BID) | ORAL | 11 refills | Status: DC

## 2021-05-24 NOTE — Telephone Encounter (Signed)
Spoke with patient and reviewed above recommendations. Pt understands use of Zio, expects call from Pharm D clinic for scheduling appt and will pick up medication from pharmacy-understands to discontinue Telmisartan. Med removed from drug list at this time. ?

## 2021-05-24 NOTE — Telephone Encounter (Signed)
-----   Message from Sherren Mocha, MD sent at 05/21/2021  5:16 PM EDT ----- ?LV function has worsened.  LV dysfunction is in a global pattern which suggests nonischemic cardiomyopathy.  Patient on high-dose metoprolol succinate, telmisartan, and spironolactone.  Recommend that she stop telmisartan.  Start Entresto 24/26 mg twice daily.  Please arrange 14-day ZIO monitor to make sure that she is not having atrial fibrillation as a cause of worsening LV dysfunction.  Please arrange follow-up in the Pharm.D. clinic for medication titration in a few weeks.  Thank you ?

## 2021-05-24 NOTE — Progress Notes (Unsigned)
Enrolled for [employer] to mail a ZIO XT long term holter monitor to the patients address on file.  

## 2021-05-26 ENCOUNTER — Ambulatory Visit (HOSPITAL_COMMUNITY): Payer: Medicare Other | Admitting: Psychiatry

## 2021-05-28 ENCOUNTER — Other Ambulatory Visit: Payer: Self-pay

## 2021-05-28 ENCOUNTER — Ambulatory Visit (INDEPENDENT_AMBULATORY_CARE_PROVIDER_SITE_OTHER): Payer: Medicare Other

## 2021-05-28 DIAGNOSIS — Z7901 Long term (current) use of anticoagulants: Secondary | ICD-10-CM

## 2021-05-28 LAB — POCT INR: INR: 3.7 — AB (ref 2.0–3.0)

## 2021-05-28 NOTE — Patient Instructions (Addendum)
Pre visit review using our clinic review tool, if applicable. No additional management support is needed unless otherwise documented below in the visit note.  Hold dose today and then change weekly dose to take 1 tablet daily except take 1 1/2 tablets on Mondays. Recheck in 2 weeks. 

## 2021-05-28 NOTE — Progress Notes (Signed)
Hold dose today and then change weekly dose to take 1 tablet daily except take 1 1/2 tablets on Mondays. Re-check in 2 weeks.  ?

## 2021-05-29 DIAGNOSIS — I480 Paroxysmal atrial fibrillation: Secondary | ICD-10-CM | POA: Diagnosis not present

## 2021-05-29 DIAGNOSIS — I2510 Atherosclerotic heart disease of native coronary artery without angina pectoris: Secondary | ICD-10-CM | POA: Diagnosis not present

## 2021-05-29 DIAGNOSIS — I10 Essential (primary) hypertension: Secondary | ICD-10-CM

## 2021-05-29 DIAGNOSIS — E782 Mixed hyperlipidemia: Secondary | ICD-10-CM | POA: Diagnosis not present

## 2021-06-01 ENCOUNTER — Ambulatory Visit: Payer: Medicare Other | Admitting: *Deleted

## 2021-06-01 ENCOUNTER — Other Ambulatory Visit: Payer: Self-pay | Admitting: Internal Medicine

## 2021-06-01 ENCOUNTER — Telehealth: Payer: Self-pay | Admitting: Internal Medicine

## 2021-06-01 DIAGNOSIS — Z7901 Long term (current) use of anticoagulants: Secondary | ICD-10-CM

## 2021-06-01 DIAGNOSIS — I5042 Chronic combined systolic (congestive) and diastolic (congestive) heart failure: Secondary | ICD-10-CM

## 2021-06-01 DIAGNOSIS — E1151 Type 2 diabetes mellitus with diabetic peripheral angiopathy without gangrene: Secondary | ICD-10-CM

## 2021-06-01 MED ORDER — WARFARIN SODIUM 3 MG PO TABS: ORAL_TABLET | ORAL | 1 refills | Status: DC

## 2021-06-01 MED ORDER — FREESTYLE LIBRE 2 SENSOR MISC: 1.0000 | Freq: Every day | 5 refills | Status: DC

## 2021-06-01 MED ORDER — FREESTYLE LIBRE 2 READER DEVI: 1.0000 | Freq: Every day | 5 refills | Status: DC

## 2021-06-01 NOTE — Telephone Encounter (Signed)
Brought to manager's attention concerning bed bugs. ?

## 2021-06-01 NOTE — Telephone Encounter (Signed)
Pt requesting a cb regarding her coumadin dosage  ?

## 2021-06-01 NOTE — Telephone Encounter (Signed)
LVM

## 2021-06-01 NOTE — Patient Instructions (Signed)
Visit Information ? ?Kristen [name], thank you for taking time to talk with me today. Please don't hesitate to contact me if I can be of assistance to you before our next scheduled telephone appointment ? ?As we discussed today, I have asked the [facility] team to contact you by phone to provide resources for dental services in your area- please listen out for a call from the Care Guide Team: remember, once you receive the resources you have requested, it will be your responsibility to follow up to obtain any services from the resources that are provided  ? ?I have also asked Dr. Ronnald Ramp to re-order a blood sugar meter for you; I asked him to order this through your [facility] ? ?Below are the goals we discussed today:  ?Patient Self-Care Activities: ?Patient Kristen [name] will: ?Take medications as prescribed ?Attend all scheduled provider appointments ?Call pharmacy for medication refills ?Call provider office for new concerns or questions ?Once you obtain a new blood sugar meter after, check blood sugar twice daily and write down these values from home monitoring: check first thing in the morning before you eat and again 2 hours after a large meal ?The goal for your fasting blood sugar (first thing in the morning, before eating) is: between 100-120 ?The goal for your 2-hours after-eating blood sugar is: between 160-180 ?Frequently review the information you have been sent about choices of food that are good to eat when you have diabetes, continue your efforts to begin making small changes to your soft diet to decrease you sugar and carbohydrate intake at home ?Try to follow a heart healthy, low salt, low carbohydrate and low sugar diet ?Continue to weigh daily and track weight on paper ?You reported today general weight ranges between 184-186 lbs when we spoke on May 06, 2021 ?Call cardiologist/ care providers if I gain more than 2 pounds in one day or 5 pounds in one week ?Watch for swelling in feet, ankles  and legs every day   ?Keep up the great work preventing falls; continue to use your cane as needed ? ?Our next scheduled telephone follow up visit/ appointment is scheduled on:  Tuesday, Jul 13, 2021 at 3:00 pm- This is a PHONE CALL appointment ? ?If you need to cancel or re-schedule our visit, please call [phone] and our care guide team will be happy to assist you. ?  ?I look forward to hearing about your progress. ?  ?[name], RN, BSN, CCRN Alumnus ?[place] ?[phone]: direct office ? ?If you are experiencing a Mental Health or [place] or need someone to talk to, please  ?call the Suicide and Crisis Lifeline: 988 ?call the [place] National Suicide Prevention Lifeline: 603-306-8565 or TTY: (305)191-9687 TTY 709-169-7472) to talk to a trained counselor ?call 1-800-273-TALK (toll free, 24 hour hotline) ?go to [facility] [address], [place] [phone]) ?call 911  ? ?Patient verbalizes understanding of instructions and care plan provided today and agrees to view in [place]. Active MyChart status confirmed with patient   ? ?

## 2021-06-01 NOTE — Chronic Care Management (AMB) (Signed)
?Chronic Care Management  ? ?CCM RN Visit Note ? ?06/01/2021 ?Name: Kristen [name] MRN: [id number] DOB: 1945/08/21 ? ?Subjective: ?Kristen [name] is a 76 y.o. year old female who is a primary care patient of [name], MD. The care management team was consulted for assistance with disease management and care coordination needs.   ? ?Engaged with patient by telephone for  acute/ unscheduled outreach  in response to provider referral for case management and/or care coordination services.  ? ?Consent to Services:  ?The patient was given information about Chronic Care Management services, agreed to services, and gave verbal consent prior to initiation of services.  Please see initial visit note for detailed documentation.  ?Patient agreed to services and verbal consent obtained.  ? ?Assessment: Review of patient past medical history, allergies, medications, health status, including review of consultants reports, laboratory and other test data, was performed as part of comprehensive evaluation and provision of chronic care management services.  ?CCM Care Plan ? ?Allergies  ?Allergen Reactions  ? Metformin And Related Other (See Comments)  ?  Must take XR form only, cannot tolerate Regular release metformin  ? Cleocin [Clindamycin Hcl] Diarrhea  ? Codeine Itching  ? Doxycycline Hyclate Diarrhea  ? Macrolides And Ketolides Diarrhea  ? Morphine Hives  ? Pentazocine Lactate Itching and Nausea And Vomiting  ?  (GENERIC- [place])  ? Vibramycin [Doxycycline Calcium] Diarrhea and Rash  ? Clindamycin Diarrhea  ? Definity [Perflutren Lipid Microsphere] Other (See Comments)  ?  Patient complained of warm sensation in chest x few seconds duration when injected Definity.No other symptoms  ? Doxycycline Diarrhea  ? Metformin Other (See Comments)  ? Pentazocine Nausea Only  ? Perflutren Other (See Comments)  ? Perflutren Lipid Microspheres Other (See Comments)  ? Sulfa Antibiotics Diarrhea and Rash  ? Sulfonamide  Derivatives Rash  ? ?Outpatient Encounter Medications as of 06/01/2021  ?Medication Sig  ? ALPRAZolam (XANAX) 1 MG tablet 1  qam  half  midday  half qhs  ? Ascorbic Acid (VITAMIN C) 1000 MG tablet Take 1,000 mg by mouth daily.  ? atorvastatin (LIPITOR) 40 MG tablet TAKE ONE TABLET BY MOUTH DAILY  ? Blood Glucose Monitoring Suppl (ONE TOUCH ULTRA 2) w/Device KIT Use TID  ? Cholecalciferol (VITAMIN D3) 50 MCG (2000 UT) capsule Take 2,000 Units by mouth daily.  ? cyclobenzaprine (FLEXERIL) 10 MG tablet Take 10 mg by mouth 3 (three) times daily as needed for muscle spasms.  ? furosemide (LASIX) 20 MG tablet TAKE ONE TABLET BY MOUTH EVERY MORNING  ? gabapentin (NEURONTIN) 300 MG capsule TAKE ONE CAPSULE BY MOUTH three times daily  ? glucose blood (COOL BLOOD GLUCOSE TEST STRIPS) test strip Use to test blood sugar TID. DX: E11.9  ? JARDIANCE 25 MG TABS tablet Take 1 tablet (25 mg total) by mouth daily. Via BI CARES pt assistance  ? Lancets (ONETOUCH ULTRASOFT) lancets Use as instructed  ? magnesium oxide (MAG-OX) 400 (240 Mg) MG tablet Take 1 tablet (400 mg total) by mouth 2 (two) times daily. Take in the morning and evening.  ? metFORMIN (GLUCOPHAGE-XR) 500 MG 24 hr tablet Take 3 tablets (1,500 mg total) by mouth daily with breakfast.  ? metoprolol succinate (TOPROL-XL) 100 MG 24 hr tablet TAKE ONE TABLET BY MOUTH TWICE DAILY WITH OR immediately following A meal  ? Multiple Vitamin (MULTIVITAMIN) tablet Take 1 tablet by mouth daily.  ? nitroGLYCERIN (NITROSTAT) 0.4 MG SL tablet Place 1 tablet (0.4 mg total) under  the tongue every 5 (five) minutes as needed for chest pain (x 3 doses). Reported on 05/08/2015  ? NOVOFINE PEN NEEDLE 32G X 6 MM MISC USE with insulin AS DIRECTED  ? PERCOCET 10-325 MG tablet Take 1 tablet by mouth 5 (five) times daily as needed.  ? potassium chloride SA (KLOR-CON) 20 MEQ tablet TAKE ONE TABLET BY MOUTH TWICE DAILY  ? sacubitril-valsartan (ENTRESTO) 24-26 MG Take 1 tablet by mouth 2 (two) times  daily.  ? Semaglutide, 2 MG/DOSE, (OZEMPIC, 2 MG/DOSE,) 8 MG/3ML SOPN Inject 2 mg into the skin once a week.  ? spironolactone (ALDACTONE) 25 MG tablet TAKE ONE TABLET BY MOUTH ONCE DAILY  ? thiamine 100 MG tablet Take 1 tablet (100 mg total) by mouth daily.  ? TOUJEO MAX SOLOSTAR 300 UNIT/ML Solostar Pen INJECT 50 UNITS SUBCUTANEOUSLY DAILY  ? warfarin (COUMADIN) 3 MG tablet TAKE 1 TABLET DAILY BY MOUTH EXCEPT TAKE  1 AND 1/2 TABLETS ON MONDAYS AND THURSDAYS OR AS DIRECTED BY ANTICOAGULATION CLINIC  ? ?No facility-administered encounter medications on file as of 06/01/2021.  ? ?Patient Active Problem List  ? Diagnosis Date Noted  ? Stage 3a chronic kidney disease ([place]) 03/17/2021  ? PAF (paroxysmal atrial fibrillation) ([place])   ? Thiamine deficiency 12/30/2018  ? Vitamin B12 deficiency anemia due to intrinsic factor deficiency 12/20/2018  ? CHF (congestive heart failure), NYHA class I, chronic, combined ([place]) 12/19/2018  ? Coronary artery disease involving native coronary artery of native heart with angina pectoris ([place]) 12/19/2018  ? Obesity with body mass index (BMI) of 30.0 to 39.9 01/15/2018  ? Chronic idiopathic constipation 12/27/2017  ? Type 2 diabetes mellitus ([place]) 10/30/2017  ? Eczema 08/07/2017  ? Insomnia secondary to chronic pain 08/04/2016  ? Visit for screening mammogram 04/14/2016  ? LV dysfunction   ? Routine general medical examination at a health care facility 05/03/2013  ? DM (diabetes mellitus), type 2 with peripheral vascular complications ([place]) 83/66/2947  ? PVC's/nonsustained VT 10/24/2012  ? H/O: CVA (cerebrovascular accident) 02/09/2012  ? Hypothyroidism 05/27/2011  ? Fibromyalgia 10/08/2010  ? Long term current use of anticoagulant 05/05/2010  ? Low back pain, non-specific 05/12/2008  ? Cognitive dysfunction associated with depression 01/29/2007  ? Osteoarthritis 11/22/2006  ? Hyperlipidemia associated with type 2 diabetes mellitus ([place]) 09/13/2006  ? Coronary atherosclerosis 09/13/2006  ?  GERD 09/13/2006  ? Hypertension associated with diabetes ([place]) 08/30/2006  ? ?Conditions to be addressed/monitored:  CHF and DMII ? ?Care Plan : RN Care Manager Plan of Care  ?Updates made by Knox Royalty, RN since 06/01/2021 12:00 AM  ?  ? ?Problem: Chronic Disease Management Needs   ?Priority: High  ?  ? ?Long-Range Goal: Ongoing adherence to established plan of care for long term chronic disease management   ?Start Date: 01/07/2021  ?Expected End Date: 01/07/2022  ?Recent Progress: On track  ?Priority: High  ?Note:   ?Current Barriers:  ?Chronic Disease Management support and education needs related to CHF and DMII ?Unable to independently verbalize appropriate choices for DM soft diet- requires ongoing reinforcement / support ?Does not consistently adhere to provider recommendations re: following low carb/ sugar diet ?Ongoing dental issues- unable to afford dental care: community resource care guide referral placed for same ?06/01/21: second referral placed with [facility] Team- patient has recently moved and has misplaced previously provided list of resources ?Chronic pain: followed regularly by pain management provider ?Mental health history: currently active with clinical psychiatry provider ? ?RNCM Clinical  Goal(s):  ?Patient will demonstrate Ongoing health management independence CHF; DMII  through collaboration with RN Care manager, provider, and care team.  ? ?Interventions: ?1:1 collaboration with primary care provider regarding development and update of comprehensive plan of care as evidenced by provider attestation and co-signature ?Inter-disciplinary care team collaboration (see longitudinal plan of care) ?Evaluation of current treatment plan related to  self management and patient's adherence to plan as established by provider ? ?06/01/21 Acute/ Unscheduled Outreach: returned patient's non-urgent voicemail, received yesterday, requesting call back ?Patient reports: ?Needs new  resources for dental work: states she needs to get this dental work completed as soon as possible; Corporate investment banker were previously provided by [employer]-- however, patient has since moved an

## 2021-06-01 NOTE — Telephone Encounter (Addendum)
Pt reports she is out of warfarin and [facility] told her she could not have a refill until tomorrow.  ?Sent in refill of warfarin to [facility], per pt request. ? ?Pt also reported she has found out the new apartment she moved into has bed bugs. Pt is worried about coming into the clinic and carrying these in the clinic. Advised this nurse can come out to the car to check her INR. Pt appreciative and verbalized understanding.  ?

## 2021-06-02 ENCOUNTER — Encounter: Payer: Self-pay | Admitting: *Deleted

## 2021-06-02 ENCOUNTER — Telehealth: Payer: Self-pay

## 2021-06-02 ENCOUNTER — Other Ambulatory Visit: Payer: Self-pay | Admitting: Internal Medicine

## 2021-06-02 ENCOUNTER — Ambulatory Visit: Payer: Medicare Other | Admitting: *Deleted

## 2021-06-02 DIAGNOSIS — E1151 Type 2 diabetes mellitus with diabetic peripheral angiopathy without gangrene: Secondary | ICD-10-CM

## 2021-06-02 DIAGNOSIS — I5042 Chronic combined systolic (congestive) and diastolic (congestive) heart failure: Secondary | ICD-10-CM

## 2021-06-02 DIAGNOSIS — F411 Generalized anxiety disorder: Secondary | ICD-10-CM

## 2021-06-02 MED ORDER — ALPRAZOLAM 1 MG PO TABS: ORAL_TABLET | ORAL | 0 refills | Status: DC

## 2021-06-02 MED ORDER — ONETOUCH ULTRA 2 W/DEVICE KIT: 1.0000 | PACK | Freq: Three times a day (TID) | 2 refills | Status: DC

## 2021-06-02 MED ORDER — COOL BLOOD GLUCOSE TEST STRIPS VI STRP: 1.0000 | ORAL_STRIP | Freq: Three times a day (TID) | 1 refills | Status: DC

## 2021-06-02 NOTE — Chronic Care Management (AMB) (Signed)
?Chronic Care Management  ? ?CCM RN Visit Note ? ?06/02/2021 ?Name: Kristen [name] MRN: [id number] DOB: November 07, 1945 ? ?Subjective: ?Kristen [name] is a 76 y.o. year old female who is a primary care patient of [name], MD. The care management team was consulted for assistance with disease management and care coordination needs.   ? ?Engaged with patient by telephone for  acute/ unscheduled outreach  in response to provider referral for case management and/or care coordination services.  ? ?Consent to Services:  ?The patient was given information about Chronic Care Management services, agreed to services, and gave verbal consent prior to initiation of services.  Please see initial visit note for detailed documentation.  ?Patient agreed to services and verbal consent obtained.  ? ?Assessment: Review of patient past medical history, allergies, medications, health status, including review of consultants reports, laboratory and other test data, was performed as part of comprehensive evaluation and provision of chronic care management services.  ?CCM Care Plan ? ?Allergies  ?Allergen Reactions  ? Metformin And Related Other (See Comments)  ?  Must take XR form only, cannot tolerate Regular release metformin  ? Cleocin [Clindamycin Hcl] Diarrhea  ? Codeine Itching  ? Doxycycline Hyclate Diarrhea  ? Macrolides And Ketolides Diarrhea  ? Morphine Hives  ? Pentazocine Lactate Itching and Nausea And Vomiting  ?  (GENERIC- [place])  ? Vibramycin [Doxycycline Calcium] Diarrhea and Rash  ? Clindamycin Diarrhea  ? Definity [Perflutren Lipid Microsphere] Other (See Comments)  ?  Patient complained of warm sensation in chest x few seconds duration when injected Definity.No other symptoms  ? Doxycycline Diarrhea  ? Metformin Other (See Comments)  ? Pentazocine Nausea Only  ? Perflutren Other (See Comments)  ? Perflutren Lipid Microspheres Other (See Comments)  ? Sulfa Antibiotics Diarrhea and Rash  ? Sulfonamide  Derivatives Rash  ? ?Outpatient Encounter Medications as of 06/02/2021  ?Medication Sig  ? ALPRAZolam (XANAX) 1 MG tablet 1  qam  half  midday  half qhs  ? Ascorbic Acid (VITAMIN C) 1000 MG tablet Take 1,000 mg by mouth daily.  ? atorvastatin (LIPITOR) 40 MG tablet TAKE ONE TABLET BY MOUTH DAILY  ? Blood Glucose Monitoring Suppl (ONE TOUCH ULTRA 2) w/Device KIT Inject 1 Act into the skin 3 (three) times daily. Use TID  ? Cholecalciferol (VITAMIN D3) 50 MCG (2000 UT) capsule Take 2,000 Units by mouth daily.  ? Continuous Blood Gluc Receiver (FREESTYLE LIBRE 2 READER) DEVI 1 Act by Does not apply route daily.  ? Continuous Blood Gluc Sensor (FREESTYLE LIBRE 2 SENSOR) MISC 1 Act by Does not apply route daily.  ? cyclobenzaprine (FLEXERIL) 10 MG tablet Take 10 mg by mouth 3 (three) times daily as needed for muscle spasms.  ? furosemide (LASIX) 20 MG tablet TAKE ONE TABLET BY MOUTH EVERY MORNING  ? gabapentin (NEURONTIN) 300 MG capsule TAKE ONE CAPSULE BY MOUTH three times daily  ? glucose blood (COOL BLOOD GLUCOSE TEST STRIPS) test strip 1 each by Other route 3 (three) times daily. Use to test blood sugar TID. DX: E11.9  ? JARDIANCE 25 MG TABS tablet Take 1 tablet (25 mg total) by mouth daily. Via BI CARES pt assistance  ? Lancets (ONETOUCH ULTRASOFT) lancets Use as instructed  ? magnesium oxide (MAG-OX) 400 (240 Mg) MG tablet Take 1 tablet (400 mg total) by mouth 2 (two) times daily. Take in the morning and evening.  ? metFORMIN (GLUCOPHAGE-XR) 500 MG 24 hr tablet Take 3 tablets (1,500  mg total) by mouth daily with breakfast.  ? metoprolol succinate (TOPROL-XL) 100 MG 24 hr tablet TAKE ONE TABLET BY MOUTH TWICE DAILY WITH OR immediately following A meal  ? Multiple Vitamin (MULTIVITAMIN) tablet Take 1 tablet by mouth daily.  ? nitroGLYCERIN (NITROSTAT) 0.4 MG SL tablet Place 1 tablet (0.4 mg total) under the tongue every 5 (five) minutes as needed for chest pain (x 3 doses). Reported on 05/08/2015  ? NOVOFINE PEN NEEDLE  32G X 6 MM MISC USE with insulin AS DIRECTED  ? PERCOCET 10-325 MG tablet Take 1 tablet by mouth 5 (five) times daily as needed.  ? potassium chloride SA (KLOR-CON) 20 MEQ tablet TAKE ONE TABLET BY MOUTH TWICE DAILY  ? sacubitril-valsartan (ENTRESTO) 24-26 MG Take 1 tablet by mouth 2 (two) times daily.  ? Semaglutide, 2 MG/DOSE, (OZEMPIC, 2 MG/DOSE,) 8 MG/3ML SOPN Inject 2 mg into the skin once a week.  ? spironolactone (ALDACTONE) 25 MG tablet TAKE ONE TABLET BY MOUTH ONCE DAILY  ? thiamine 100 MG tablet Take 1 tablet (100 mg total) by mouth daily.  ? TOUJEO MAX SOLOSTAR 300 UNIT/ML Solostar Pen INJECT 50 UNITS SUBCUTANEOUSLY DAILY  ? warfarin (COUMADIN) 3 MG tablet TAKE 1 TABLET DAILY BY MOUTH EXCEPT TAKE  1 AND 1/2 TABLETS ON MONDAYS OR AS DIRECTED BY ANTICOAGULATION CLINIC  ? ?No facility-administered encounter medications on file as of 06/02/2021.  ? ?Patient Active Problem List  ? Diagnosis Date Noted  ? Stage 3a chronic kidney disease ([place]) 03/17/2021  ? PAF (paroxysmal atrial fibrillation) ([place])   ? Thiamine deficiency 12/30/2018  ? Vitamin B12 deficiency anemia due to intrinsic factor deficiency 12/20/2018  ? CHF (congestive heart failure), NYHA class I, chronic, combined ([place]) 12/19/2018  ? Coronary artery disease involving native coronary artery of native heart with angina pectoris ([place]) 12/19/2018  ? Obesity with body mass index (BMI) of 30.0 to 39.9 01/15/2018  ? Chronic idiopathic constipation 12/27/2017  ? Type 2 diabetes mellitus ([place]) 10/30/2017  ? Eczema 08/07/2017  ? Insomnia secondary to chronic pain 08/04/2016  ? Visit for screening mammogram 04/14/2016  ? LV dysfunction   ? Routine general medical examination at a health care facility 05/03/2013  ? DM (diabetes mellitus), type 2 with peripheral vascular complications ([place]) 95/32/0233  ? PVC's/nonsustained VT 10/24/2012  ? H/O: CVA (cerebrovascular accident) 02/09/2012  ? Hypothyroidism 05/27/2011  ? Fibromyalgia 10/08/2010  ? Long term current  use of anticoagulant 05/05/2010  ? Low back pain, non-specific 05/12/2008  ? Cognitive dysfunction associated with depression 01/29/2007  ? Osteoarthritis 11/22/2006  ? Hyperlipidemia associated with type 2 diabetes mellitus ([place]) 09/13/2006  ? Coronary atherosclerosis 09/13/2006  ? GERD 09/13/2006  ? Hypertension associated with diabetes ([place]) 08/30/2006  ? ?Conditions to be addressed/monitored:  CHF and DMII ? ?Care Plan : RN Care Manager Plan of Care  ?Updates made by Knox Royalty, RN since 06/02/2021 12:00 AM  ?  ? ?Problem: Chronic Disease Management Needs   ?Priority: High  ?  ? ?Long-Range Goal: Ongoing adherence to established plan of care for long term chronic disease management   ?Start Date: 01/07/2021  ?Expected End Date: 01/07/2022  ?Recent Progress: On track  ?Priority: High  ?Note:   ?Current Barriers:  ?Chronic Disease Management support and education needs related to CHF and DMII ?Unable to independently verbalize appropriate choices for DM soft diet- requires ongoing reinforcement / support ?Does not consistently adhere to provider recommendations re: following low carb/ sugar diet ?Ongoing dental issues-  unable to afford dental care: community resource care guide referral placed for same ?06/01/21: second referral placed with [place] Team- patient has recently moved and has misplaced previously provided list of resources ?Chronic pain: followed regularly by pain management provider ?Mental health history: currently active with clinical psychiatry provider ? ?RNCM Clinical Goal(s):  ?Patient will demonstrate Ongoing health management independence CHF; DMII  through collaboration with RN Care manager, provider, and care team.  ? ?Interventions: ?1:1 collaboration with primary care provider regarding development and update of comprehensive plan of care as evidenced by provider attestation and co-signature ?Inter-disciplinary care team collaboration (see longitudinal plan of  care) ?Evaluation of current treatment plan related to  self management and patient's adherence to plan as established by provider ? ?06/02/21: Acute incoming call with voice message requesting call back received f

## 2021-06-02 NOTE — Telephone Encounter (Signed)
Pt states that she needs a one touch monitor. Pt contact Pharmacy to return the ?Continuous Blood Gluc Receiver (FREESTYLE LIBRE 2 READER) DEVI ?Continuous Blood Gluc Sensor (FREESTYLE LIBRE 2 SENSOR) MISC ? ?Pt is also requesting a refill for  ?ALPRAZolam (XANAX) 1 MG tablet ? ?Pharmacy: ?[facility] - [place], [place] - [address] ? ?LOV 03/17/21 ?ROV 09/28/21 ?

## 2021-06-02 NOTE — Telephone Encounter (Signed)
? ?  Telephone encounter was:  Successful.  ?06/02/2021 ?Name: Kristen [name] MRN: [id number] DOB: Feb 06, 1946 ? ?Kristen [name] is a 76 y.o. year old female who is a primary care patient of [name], MD . The community resource team was consulted for assistance with  dental ? ?Care guide performed the following interventions: Patient provided with information about care guide support team and interviewed to confirm resource needs. Patient misplaced dental resources sent by other Care Guide I will remail and email resources to her and follow up ? ?Follow Up Plan:  Care guide will follow up with patient by phone over the next day ? ? ? ?Larena Sox ?Care Guide, Embedded Care Coordination ?, Care Management  ?[phone] ?[address], [place], [place] [address] ?Phone: [phone] ?Email: [email or web address] ? ?  ?

## 2021-06-03 ENCOUNTER — Telehealth: Payer: Self-pay

## 2021-06-03 DIAGNOSIS — Z79891 Long term (current) use of opiate analgesic: Secondary | ICD-10-CM | POA: Diagnosis not present

## 2021-06-03 DIAGNOSIS — M47816 Spondylosis without myelopathy or radiculopathy, lumbar region: Secondary | ICD-10-CM | POA: Diagnosis not present

## 2021-06-03 DIAGNOSIS — M150 Primary generalized (osteo)arthritis: Secondary | ICD-10-CM | POA: Diagnosis not present

## 2021-06-03 DIAGNOSIS — G894 Chronic pain syndrome: Secondary | ICD-10-CM | POA: Diagnosis not present

## 2021-06-03 NOTE — Telephone Encounter (Signed)
? ?  Letters   ? ? ?Larena Sox ?Care Guide, Embedded Care Coordination ?[place], Care Management  ?[phone] ?[address], [place], [place] [address] ?Phone: [phone] ?Email: [email or web address] ? ?  ?

## 2021-06-03 NOTE — Telephone Encounter (Signed)
? ?  Telephone encounter was:  Unsuccessful.  06/03/2021 ?Name: Kristen [name] MRN: [id number] DOB: [birth year] ? ?Unsuccessful outbound call made today to assist with:   dental ? ?Outreach Attempt:  2nd Attempt ? ?A HIPAA compliant voice message was left requesting a return call.  Instructed patient to call back at earliest convenience.. ? ? ? ?Larena Sox ?Care Guide, Embedded Care Coordination ?[place], Care Management  ?[phone] ?[address], [place], [place] [address] ?Phone: [phone] ?Email: [email or web address] ? ?  ?

## 2021-06-04 DIAGNOSIS — I5042 Chronic combined systolic (congestive) and diastolic (congestive) heart failure: Secondary | ICD-10-CM

## 2021-06-04 DIAGNOSIS — E1151 Type 2 diabetes mellitus with diabetic peripheral angiopathy without gangrene: Secondary | ICD-10-CM | POA: Diagnosis not present

## 2021-06-08 ENCOUNTER — Ambulatory Visit: Payer: Medicare Other

## 2021-06-08 NOTE — Telephone Encounter (Signed)
Pt called to RS her coumadin clinic apt today to next week because they are treating for the bed bugs and she does not feel safe coming in this week. She also reports she thinks she is having allergies and is going to send a msg to PCP concerning that. Advised if anything else is needed to contact the coumadin clinic. Pt verbalized understanding and was appreciative.  ?RS apt for 06/15/21, one week.  ?

## 2021-06-10 ENCOUNTER — Encounter: Payer: Self-pay | Admitting: Internal Medicine

## 2021-06-14 ENCOUNTER — Encounter: Payer: Self-pay | Admitting: Internal Medicine

## 2021-06-15 ENCOUNTER — Encounter: Payer: Self-pay | Admitting: Internal Medicine

## 2021-06-15 ENCOUNTER — Ambulatory Visit (INDEPENDENT_AMBULATORY_CARE_PROVIDER_SITE_OTHER): Payer: Medicare Other | Admitting: Internal Medicine

## 2021-06-15 ENCOUNTER — Ambulatory Visit (INDEPENDENT_AMBULATORY_CARE_PROVIDER_SITE_OTHER): Payer: Medicare Other

## 2021-06-15 VITALS — BP 118/76 | HR 94 | Temp 98.3°F | Ht 61.0 in | Wt 179.0 lb

## 2021-06-15 DIAGNOSIS — F411 Generalized anxiety disorder: Secondary | ICD-10-CM | POA: Diagnosis not present

## 2021-06-15 DIAGNOSIS — Z7901 Long term (current) use of anticoagulants: Secondary | ICD-10-CM

## 2021-06-15 DIAGNOSIS — D510 Vitamin B12 deficiency anemia due to intrinsic factor deficiency: Secondary | ICD-10-CM

## 2021-06-15 DIAGNOSIS — F333 Major depressive disorder, recurrent, severe with psychotic symptoms: Secondary | ICD-10-CM | POA: Diagnosis not present

## 2021-06-15 LAB — POCT INR: INR: 2.4 (ref 2.0–3.0)

## 2021-06-15 MED ORDER — QUETIAPINE FUMARATE 50 MG PO TABS: 50.0000 mg | ORAL_TABLET | Freq: Every day | ORAL | 0 refills | Status: DC

## 2021-06-15 MED ORDER — VILAZODONE HCL 10 MG PO TABS: 10.0000 mg | ORAL_TABLET | Freq: Every day | ORAL | 0 refills | Status: DC

## 2021-06-15 MED ORDER — CYANOCOBALAMIN 1000 MCG/ML IJ SOLN
1000.0000 ug | Freq: Once | INTRAMUSCULAR | Status: AC
  Administered 2021-06-15: 1000 ug via INTRAMUSCULAR
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
## 2021-06-15 NOTE — Telephone Encounter (Signed)
Pt called today to report she has an apt today at 4pm with PCP to evaluate nausea. She reports they are making special accomodation for her to come into the office.  ?Will check INR at time of her apt with PCP. ?

## 2021-06-15 NOTE — Patient Instructions (Addendum)
Pre visit review using our clinic review tool, if applicable. No additional management support is needed unless otherwise documented below in the visit note. ? ?Continue 1 tablet daily except take 1 1/2 tablets on Mondays. Re-check in 3 weeks.  ?

## 2021-06-15 NOTE — Patient Instructions (Signed)
Major Depressive Disorder, Adult °Major depressive disorder (MDD) is a mental health condition. It may also be called clinical depression or unipolar depression. MDD causes symptoms of sadness, hopelessness, and loss of interest in things. These symptoms last most of the day, almost every day, for 2 weeks. MDD can also cause physical symptoms. It can interfere with relationships and with everyday activities, such as work, school, and activities that are usually pleasant. °MDD may be mild, moderate, or severe. It may be single-episode MDD, which happens once, or recurrent MDD, which may occur multiple times. °What are the causes? °The exact cause of this condition is not known. MDD is most likely caused by a combination of things, which may include: °Your personality traits. °Learned or conditioned behaviors or thoughts or feelings that reinforce negativity. °Any alcohol or substance misuse. °Long-term (chronic) physical or mental health illness. °Going through a traumatic experience or major life changes. °What increases the risk? °The following factors may make someone more likely to develop MDD: °A family history of depression. °Being a woman. °Troubled family relationships. °Abnormally low levels of certain brain chemicals. °Traumatic or painful events in childhood, especially abuse or loss of a parent. °A lot of stress from life experiences, such as poor living conditions or discrimination. °Chronic physical illness or other mental health disorders. °What are the signs or symptoms? °The main symptoms of MDD usually include: °Constant depressed or irritable mood. °A loss of interest in things and activities. °Other symptoms include: °Sleeping or eating too much or too little. °Unexplained weight gain or weight loss. °Tiredness or low energy. °Being agitated, restless, or weak. °Feeling hopeless, worthless, or guilty. °Trouble thinking clearly or making decisions. °Thoughts of suicide or thoughts of harming  others. °Isolating oneself or avoiding other people or activities. °Trouble completing tasks, work, or any normal obligations. °Severe symptoms of this condition may include: °Psychotic depression.This may include false beliefs, or delusions. It may also include seeing, hearing, tasting, smelling, or feeling things that are not real (hallucinations). °Chronic depression or persistent depressive disorder. This is low-level depression that lasts for at least 2 years. °Melancholic depression, or feeling extremely sad and hopeless. °Catatonic depression, which includes trouble speaking and trouble moving. °How is this diagnosed? °This condition may be diagnosed based on: °Your symptoms. °Your medical and mental health history. You may be asked questions about your lifestyle, including any drug and alcohol use. °A physical exam. °Blood tests to rule out other conditions. °MDD is confirmed if you have the following symptoms most of the day, nearly every day, in a 2-week period: °Either a depressed mood or loss of interest. °At least four other MDD symptoms. °How is this treated? °This condition is usually treated by mental health professionals, such as psychologists, psychiatrists, and clinical social workers. You may need more than one type of treatment. Treatment may include: °Psychotherapy, also called talk therapy or counseling. Types of psychotherapy include: °Cognitive behavioral therapy (CBT). This teaches you to recognize unhealthy feelings, thoughts, and behaviors, and replace them with positive thoughts and actions. °Interpersonal therapy (IPT). This helps you to improve the way you communicate with others or relate to them. °Family therapy. This treatment includes members of your family. °Medicines to treat anxiety and depression. These medicines help to balance the brain chemicals that affect your emotions. °Lifestyle changes. You may be asked to: °Limit alcohol use and avoid drug use. °Get regular  exercise. °Get plenty of sleep. °Make healthy eating choices. °Spend more time outdoors. °Brain stimulation. This may   be done if symptoms are very severe and other treatments have not worked. Examples of this treatment are electroconvulsive therapy and transcranial magnetic stimulation. °Follow these instructions at home: °Activity °Exercise regularly and spend time outdoors. °Find activities that you enjoy doing, and make time to do them. °Find healthy ways to manage stress, such as: °Meditation or deep breathing. °Spending time in nature. °Journaling. °Return to your normal activities as told by your health care provider. Ask your health care provider what activities are safe for you. °Alcohol and drug use °If you drink alcohol: °Limit how much you use to: °0-1 drink a day for women who are not pregnant. °0-2 drinks a day for men. °Be aware of how much alcohol is in your drink. In the [place] one drink equals one 12 oz bottle of beer (355 mL), one 5 oz glass of wine (148 mL), or one 1½ oz glass of hard liquor (44 mL). °Discuss your alcohol use with your health care provider. Alcohol can affect any antidepressant medicines you are taking. °Discuss any drug use with your health care provider. °General instructions ° °Take over-the-counter and prescription medicines only as told by your health care provider. °Eat a healthy diet and get plenty of sleep. °Consider joining a support group. Your health care provider may be able to recommend one. °Keep all follow-up visits as told by your health care provider. This is important. °Where to find more information °National Alliance on Mental Illness: www.nami.org °[place] National Institute of Mental Health: www.nimh.nih.gov °Contact a health care provider if: °Your symptoms get worse. °You develop new symptoms. °Get help right away if: °You self-harm. °You have serious thoughts about hurting yourself or others. °You hallucinate. °If you ever feel like you may hurt yourself or  others, or have thoughts about taking your own life, get help right away. Go to your nearest emergency department or: °Call your local emergency services (911 in the [place] °Call a suicide crisis helpline, such as the National Suicide Prevention Lifeline at 1-800-273-8255 or 988 in the [place] This is open 24 hours a day in the [place] °Text the Crisis Text Line at 741741 (in the [place] °Summary °Major depressive disorder (MDD) is a mental health condition. MDD causes symptoms of sadness, hopelessness, and loss of interest in things. These symptoms last most of the day, almost every day, for 2 weeks. °The symptoms of MDD can interfere with relationships and with everyday activities. °Treatments and support are available for people who develop MDD. You may need more than one type of treatment. °Get help right away if you have serious thoughts about hurting yourself or others. °This information is not intended to replace advice given to you by your health care provider. Make sure you discuss any questions you have with your health care provider. °Document Revised: 09/16/2020 Document Reviewed: 02/02/2019 °Elsevier Patient Education © 2022 [employer]vier Inc. ° °

## 2021-06-15 NOTE — Progress Notes (Addendum)
Continue 1 tablet daily except take 1 1/2 tablets on Mondays. Re-check in 3 weeks, due to pt's diet.  ? ?Pt requested B12 injection today and is due for injection. ?Per orders of Dr. Ronnald Ramp, injection of B12 in L deltoid given by Randall An. ?Patient tolerated injection well. ? ?

## 2021-06-16 ENCOUNTER — Telehealth: Payer: Self-pay | Admitting: *Deleted

## 2021-06-16 ENCOUNTER — Encounter: Payer: Self-pay | Admitting: Internal Medicine

## 2021-06-16 NOTE — Chronic Care Management (AMB) (Signed)
?  Chronic Care Management  ? ?Note ? ?06/16/2021 ?Name: Kristen [name] MRN: [id number] DOB: June 25, 1945 ? ?Kristen [name] is a 76 y.o. year old female who is a primary care patient of Ronnald Ramp Arvid Right, MD. Kristen [name] is currently enrolled in care management services. An additional referral for Licensed Clinical SW was placed.  ? ?Follow up plan: ?Telephone appointment with care management team member scheduled for: 06/21/2021 ? ?Veena Sturgess, CCMA ?Care Guide, Embedded Care Coordination ?[facility]  ?Direct Dial: [phone] ? ? ?

## 2021-06-17 ENCOUNTER — Encounter: Payer: Self-pay | Admitting: Internal Medicine

## 2021-06-17 ENCOUNTER — Ambulatory Visit: Payer: Medicare Other

## 2021-06-18 ENCOUNTER — Telehealth: Payer: Self-pay

## 2021-06-18 NOTE — Telephone Encounter (Signed)
Patient assistance applications for entresto, jardiance, and ozempic mailed to patient as requested ? ?Tomasa Blase, PharmD ?Clinical Pharmacist, [place]  ? ?

## 2021-06-19 ENCOUNTER — Other Ambulatory Visit: Payer: Self-pay | Admitting: Internal Medicine

## 2021-06-19 DIAGNOSIS — F333 Major depressive disorder, recurrent, severe with psychotic symptoms: Secondary | ICD-10-CM

## 2021-06-20 ENCOUNTER — Other Ambulatory Visit: Payer: Self-pay | Admitting: Internal Medicine

## 2021-06-20 DIAGNOSIS — F333 Major depressive disorder, recurrent, severe with psychotic symptoms: Secondary | ICD-10-CM

## 2021-06-20 MED ORDER — VILAZODONE HCL 20 MG PO TABS: 1.0000 | ORAL_TABLET | Freq: Every day | ORAL | 0 refills | Status: DC

## 2021-06-21 ENCOUNTER — Ambulatory Visit (INDEPENDENT_AMBULATORY_CARE_PROVIDER_SITE_OTHER): Payer: Medicare Other | Admitting: Licensed Clinical Social Worker

## 2021-06-21 ENCOUNTER — Other Ambulatory Visit: Payer: Self-pay | Admitting: Internal Medicine

## 2021-06-21 ENCOUNTER — Telehealth: Payer: Self-pay | Admitting: Internal Medicine

## 2021-06-21 DIAGNOSIS — F411 Generalized anxiety disorder: Secondary | ICD-10-CM

## 2021-06-21 DIAGNOSIS — F333 Major depressive disorder, recurrent, severe with psychotic symptoms: Secondary | ICD-10-CM

## 2021-06-21 DIAGNOSIS — A09 Infectious gastroenteritis and colitis, unspecified: Secondary | ICD-10-CM | POA: Insufficient documentation

## 2021-06-21 DIAGNOSIS — I480 Paroxysmal atrial fibrillation: Secondary | ICD-10-CM | POA: Diagnosis not present

## 2021-06-21 DIAGNOSIS — I2510 Atherosclerotic heart disease of native coronary artery without angina pectoris: Secondary | ICD-10-CM | POA: Diagnosis not present

## 2021-06-21 DIAGNOSIS — I10 Essential (primary) hypertension: Secondary | ICD-10-CM | POA: Diagnosis not present

## 2021-06-21 NOTE — Chronic Care Management (AMB) (Addendum)
?Chronic Care Management  ? Clinical Social Work Note ? ?06/21/2021 ?Name: Kristen [name] MRN: [id number] DOB: 1945/08/24 ? ?Kristen [name] is a 76 y.o. year old female who is a primary care patient of [name], MD. The CCM team was consulted to assist the patient with chronic disease management and/or care coordination needs related to: Mental Health Counseling and Resources.  ? ?Engaged with patient by telephone for initial visit in response to provider referral for social work chronic care management and care coordination services.  ? ?Consent to Services:  ?The patient was given information about Chronic Care Management services, agreed to services, and gave verbal consent prior to initiation of services.  Please see initial visit note for detailed documentation.  ? ?Patient agreed to services and consent obtained.  ? ?Summary: Assessed patient's previous and current treatment, coping skills, support system and barriers to care. Received contact information from [facility] for [facility] but unable to connect with the phone number provider.   She is currently experiencing symptoms of  anxiety which seems to be exacerbated by her current living situation. Unable to complete full assessment of all needs will continue at next encounter..  See Care Plan below for interventions and patient self-care actives. ?Update: [facility] unable to take patient at this time. Provided information on availably at [facility]. Information shared with patient. ? ?Recommendation: Patient may benefit from, and is in agreement for LCSW to make referral for counseling to [facility].  ? ?Follow up Plan: Patient would like continued follow-up from CCM LCSW.  per patient's request will follow up in 30 days.  Will call office if needed prior to next encounter. ?  ?Assessment: Review of patient past medical history, allergies, medications, and health status, including review of  relevant consultants reports was performed today as part of a comprehensive evaluation and provision of chronic care management and care coordination services.    ? ?SDOH (Social Determinants of Health) assessments and interventions performed:  ?SDOH Interventions   ? ?Flowsheet Row Most Recent Value  ?SDOH Interventions   ?Stress Interventions Provide Counseling  ? ?  ?  ? ?Advanced Directives Status: Not addressed in this encounter. ? ?CCM Care Plan ?Conditions to be addressed/monitored: Anxiety and Depression;  ? ?Care Plan : LCSW Plan of Care  ?Updates made by [name], LCSW since 06/21/2021 12:00 AM  ?  ? ?Problem: Coping Skills   ?  ? ?Goal: Coping Skills Enhanced   ?Start Date: 06/21/2021  ?This Visit's Progress: On track  ?Priority: High  ?Note:   ?Current Barriers:  ?Disease Management support and education needs related to Depression: anxiety ?Family and relationship dysfunction ? ?CSW Clinical Goal(s):  ?Patient  will demonstrate a reduction in symptoms related to :symptom reduction  through collaboration with Clinical Social Worker, provider, and care team.  ? ?Interventions: ?1:1 collaboration with primary care provider regarding development and update of comprehensive plan of care as evidenced by provider attestation and co-signature ?Inter-disciplinary care team collaboration (see longitudinal plan of care) ?Evaluation of current treatment plan related to  self management and patient's adherence to plan as established by provider ? ?Mental Health:  (Status: New goal.) ?Evaluation of current treatment plan related to Depression: anxiety ?Solution-Focused Strategies employed:  ?Active listening / Reflection utilized  ?Problem Solving /Task Center strategies reviewed ?Reviewed mental health medications and discussed importance of compliance: reports no missed doses of Seroquel and ViBryd ?Participation in counseling encouraged  ?Discussed referral for psychiatry:  options discussed ? ?Task &  activities to accomplish goals: ?Call [facility] [phone] to follow up on medication evaluation for psychiatry services ?Continue with compliance of taking medication  ?I have placed a referral with [facility] [phone]  they will contact you.   ? ? ?  ? Casimer Lanius, LCSW ?Licensed Clinical Social Worker [facility]  ?[place]  ?[phone]  ? ? ? ?

## 2021-06-21 NOTE — Patient Instructions (Addendum)
Visit Information  ? ?Thank you for taking time to visit with me today. Please don't hesitate to contact me if I can be of assistance to you before our next scheduled telephone appointment. ? ?Following are the goals we discussed today: Coping Skills and managing stress ? ?Our next appointment is by telephone on May 17th at 3:00 ? ?Please call the care guide team at [phone] if you need to cancel or reschedule your appointment.  ? ?If you are experiencing a Mental Health or [place] or need someone to talk to, please call 1-800-273-TALK (toll free, 24 hour hotline) ?go to [facility] [address], [place] [phone])  ? ?Following is a copy of your full care plan:  ?Care Plan : [place]  ?Updates made by [name], LCSW since 06/21/2021 12:00 AM  ?  ? ?Problem: Coping Skills   ?  ? ?Goal: Coping Skills Enhanced   ?Start Date: 06/21/2021  ?This Visit's Progress: On track  ?Priority: High  ?Note:   ?Current Barriers:  ?Disease Management support and education needs related to Depression: anxiety ? ?CSW Clinical Goal(s):  ?Patient  will demonstrate a reduction in symptoms related to :symptom reduction  through collaboration with Clinical Social Worker, provider, and care team.  ? ?Interventions: ?1:1 collaboration with primary care provider regarding development and update of comprehensive plan of care as evidenced by provider attestation and co-signature ?Inter-disciplinary care team collaboration (see longitudinal plan of care) ?Evaluation of current treatment plan related to  self management and patient's adherence to plan as established by provider ? ?Mental Health:  (Status: New goal.) ?Evaluation of current treatment plan related to Depression: anxiety ?Solution-Focused Strategies employed:  ?Active listening / Reflection utilized  ?Problem Solving /Task Center strategies reviewed ?Reviewed mental health medications and discussed  importance of compliance: reports no missed doses of Seroquel and ViBryd ?Participation in counseling encouraged  ?Discussed referral for psychiatry: options discussed ? ?Task & activities to accomplish goals: ?Call [facility] [phone] to follow up on medication evaluation for psychiatry services ?Continue with compliance of taking medication  ?I have placed a referral with [facility] [phone] however they do not have availably at this time with a provider at this time. They recommended [facility]. ?Please call [facility] [phone] they provide both counseling and medication evaluation ?Another option is [facility] [phone]  they are also located on [facility]   ?  ? ? ?Consent to CCM Services: ?Ms. Andera Cranmer was given information about Chronic Care Management services including:  ?CCM service includes personalized support from designated clinical staff supervised by her physician, including individualized plan of care and coordination with other care providers ?24/7 contact phone numbers for assistance for urgent and routine care needs. ?Service will only be billed when office clinical staff spend 20 minutes or more in a month to coordinate care. ?Only one practitioner may furnish and bill the service in a calendar month. ?The patient may stop CCM services at any time (effective at the end of the month) by phone call to the office staff. ?The patient will be responsible for cost sharing (co-pay) of up to 20% of the service fee (after annual deductible is met). ? ?Patient agreed to services and verbal consent obtained.  ? ?Patient verbalizes understanding of instructions and care plan provided today and agrees to view in [place]. Active MyChart status confirmed with patient.   ? ?Casimer Lanius, LCSW ?Licensed Clinical Social  Worker [facility]  ?[place]  ?[phone]  ? ? ?  ?

## 2021-06-21 NOTE — Telephone Encounter (Signed)
Pt checking response of mychart mssg left on 4-13 ? ?Pt states her stool is still watery, pt requesting a cb ?

## 2021-06-22 ENCOUNTER — Encounter: Payer: Self-pay | Admitting: Internal Medicine

## 2021-06-22 NOTE — Telephone Encounter (Signed)
Pt has been informed and will come by today to do the stool sample.  ?

## 2021-06-23 ENCOUNTER — Telehealth: Payer: Self-pay

## 2021-06-23 ENCOUNTER — Encounter: Payer: Self-pay | Admitting: Internal Medicine

## 2021-06-23 ENCOUNTER — Other Ambulatory Visit: Payer: Self-pay | Admitting: Cardiovascular Disease

## 2021-06-23 DIAGNOSIS — I2510 Atherosclerotic heart disease of native coronary artery without angina pectoris: Secondary | ICD-10-CM

## 2021-06-23 DIAGNOSIS — I25119 Atherosclerotic heart disease of native coronary artery with unspecified angina pectoris: Secondary | ICD-10-CM

## 2021-06-23 IMAGING — MG DIGITAL SCREENING BILAT W/ TOMO W/ CAD
8 series · 8 of 24 positions shown · non-contrast
Comparison: Previous exam(s).

CLINICAL DATA: Screening.

EXAM:
DIGITAL SCREENING BILATERAL MAMMOGRAM WITH TOMO AND CAD

[R MLO synth-2D]
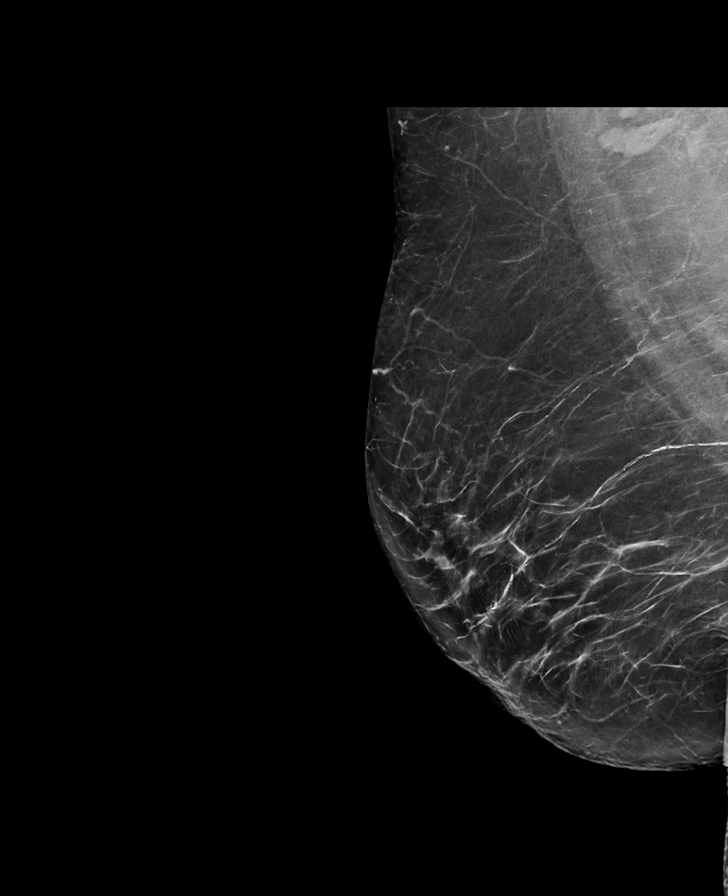

[R CC synth-2D]
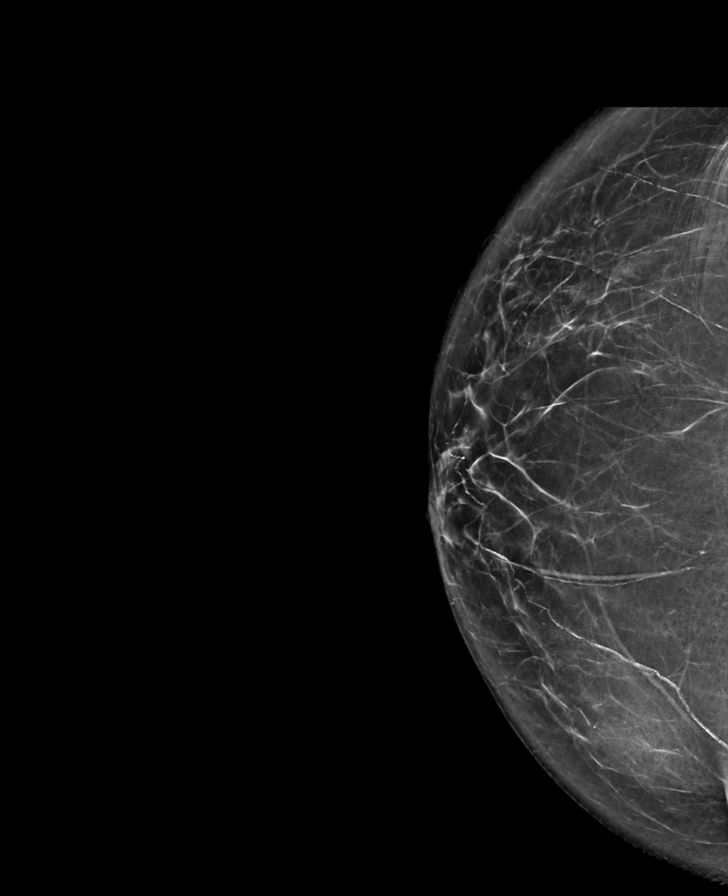

[L CC synth-2D]
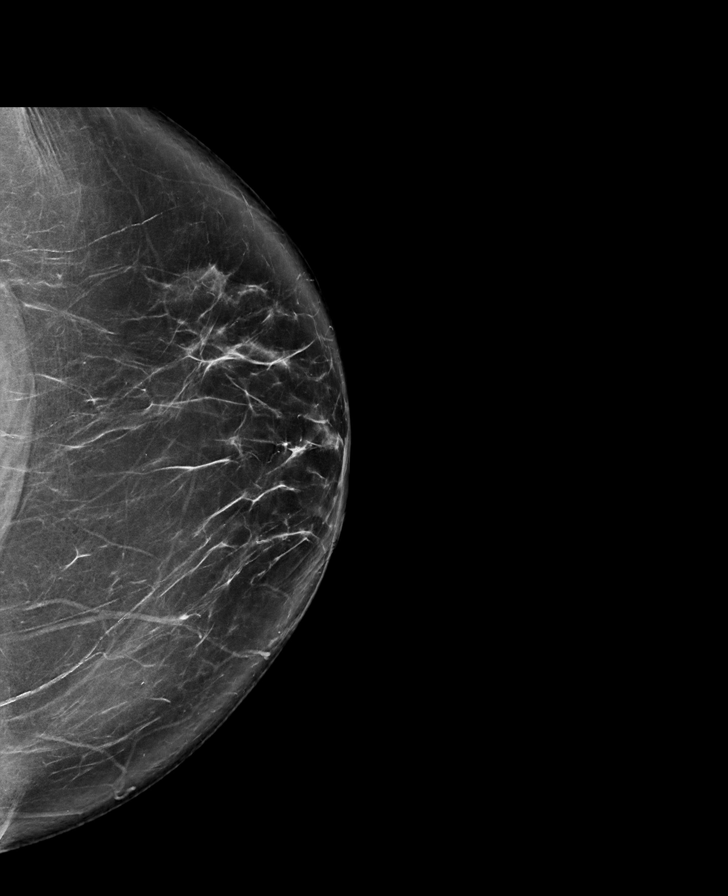

[L MLO synth-2D]
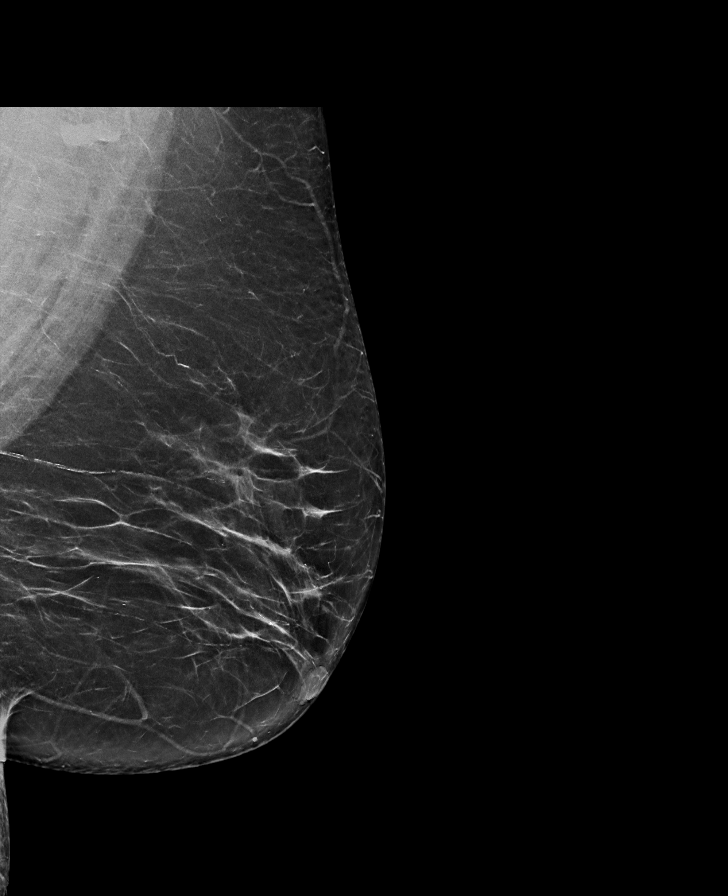

[R CC tomo · tomo slice 33/66.0]
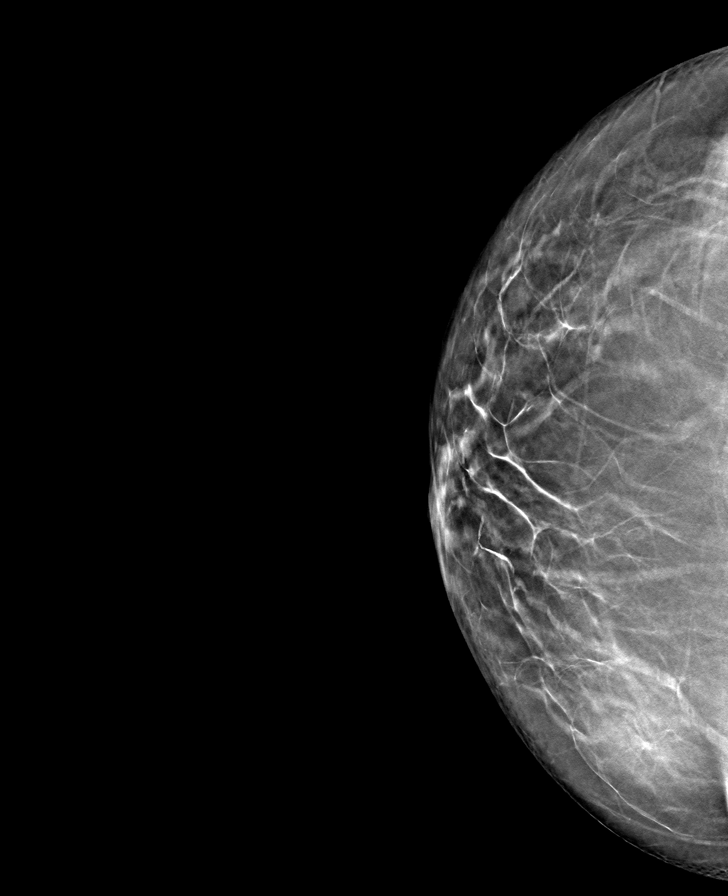

[R MLO tomo · tomo slice 37/74.0]
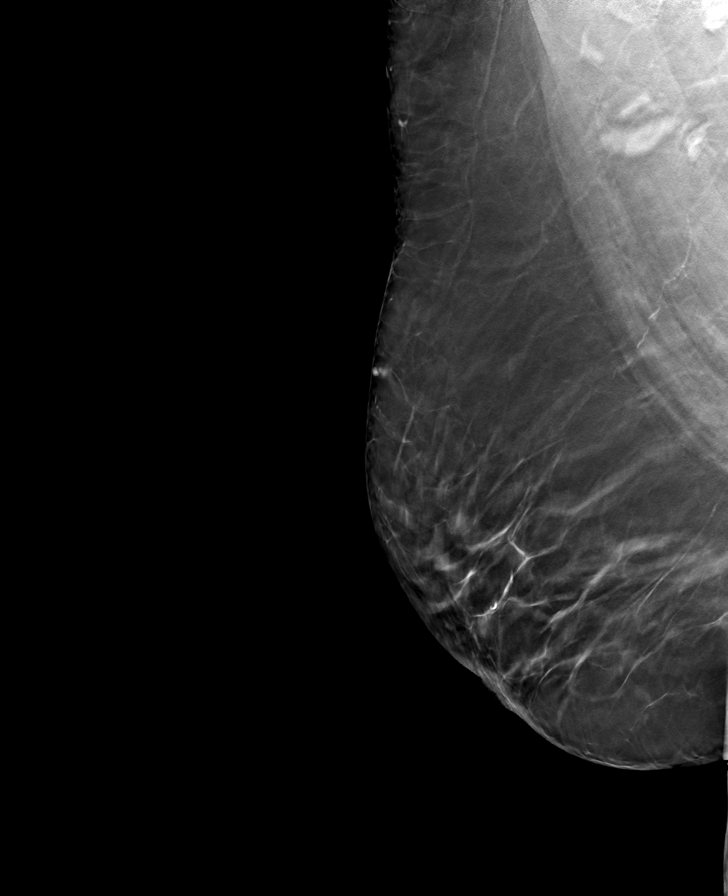

[L MLO tomo · tomo slice 38/75.0]
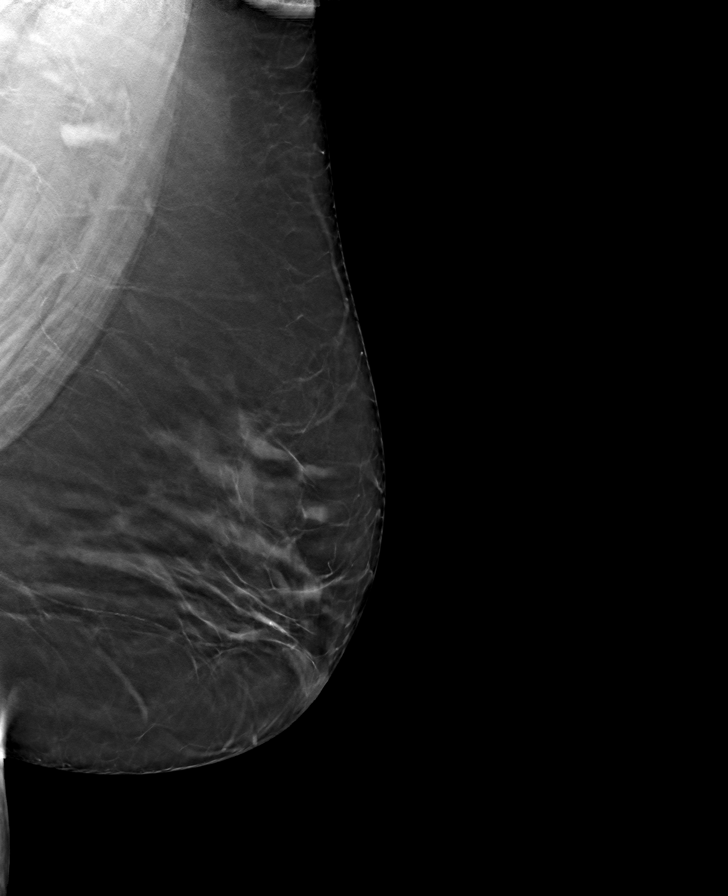

[L CC tomo · tomo slice 43/84.0]
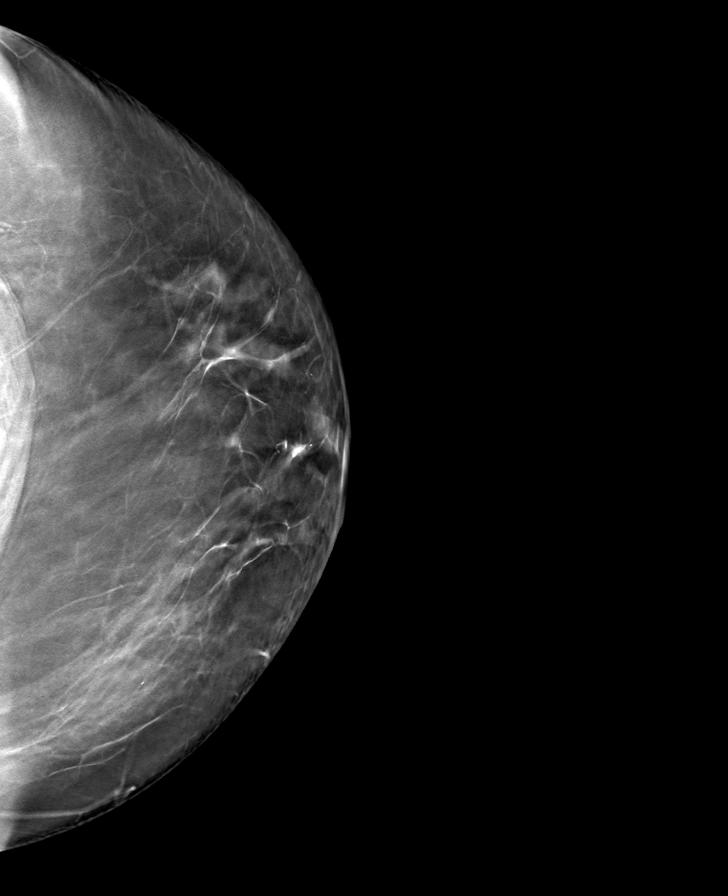

[8 of 24 positions shown; findings below may reference images not displayed]

ACR Breast Density Category b: There are scattered areas of
fibroglandular density.
FINDINGS: There are no findings suspicious for malignancy. Images were
processed with CAD.
IMPRESSION: No mammographic evidence of malignancy. A result letter of this
screening mammogram will be mailed directly to the patient.

RECOMMENDATION:
Screening mammogram in one year. (Code:[id number])

BI-RADS CATEGORY  1: Negative.

## 2021-06-23 MED ORDER — ENTRESTO 24-26 MG PO TABS: 1.0000 | ORAL_TABLET | Freq: Two times a day (BID) | ORAL | 3 refills | Status: DC

## 2021-06-23 NOTE — Telephone Encounter (Signed)
**Note De-Identified  Obfuscation** A completed providers page of a [employer] pt asst application for Delene Loll was faxed to [place] from Deeann Dowse, [facility] from [facility] with a request to have Dr Burt Knack sign and date it as well as a printed Entresto 24-26 mg prescription for #180 with 3 refills. ? ?I have emailed all to Dr York Cerise nurse so she can print the [employer], have Dr Burt Knack sign it and the application and so she can fax back to Deeann Dowse, [facility] at the fax number written on the cover letter included. ? ?

## 2021-06-23 NOTE — Progress Notes (Signed)
Per Jeani Hawking [facility], pt is getting pt assistance through the drug company and needs a printed rx for #180 with 3 refills. Rx printed and faxed with application at this time. ?

## 2021-06-27 ENCOUNTER — Ambulatory Visit (HOSPITAL_COMMUNITY)
Admission: EM | Admit: 2021-06-27 | Discharge: 2021-06-27 | Disposition: A | Discharge status: Home / Self Care | Payer: Medicare Other | Attending: Physician Assistant | Admitting: Physician Assistant

## 2021-06-27 ENCOUNTER — Encounter (HOSPITAL_COMMUNITY): Payer: Self-pay | Admitting: *Deleted

## 2021-06-27 DIAGNOSIS — R195 Other fecal abnormalities: Secondary | ICD-10-CM | POA: Diagnosis not present

## 2021-06-27 DIAGNOSIS — R42 Dizziness and giddiness: Secondary | ICD-10-CM

## 2021-06-27 DIAGNOSIS — N3001 Acute cystitis with hematuria: Secondary | ICD-10-CM

## 2021-06-27 LAB — POCT URINALYSIS DIPSTICK, ED / UC
Bilirubin Urine: NEGATIVE
Glucose, UA: >=1000 mg/dL — AB
Ketones, ur: NEGATIVE mg/dL
Nitrite: POSITIVE — AB
Protein, ur: NEGATIVE mg/dL
Specific Gravity, Urine: 1.01 (ref 1.005–1.030)
Urobilinogen, UA: 0.2 mg/dL (ref 0.0–1.0)
pH: 5 (ref 5.0–8.0)

## 2021-06-27 LAB — CBG MONITORING, ED: Glucose-Capillary: 181 mg/dL — ABNORMAL HIGH (ref 70–99)

## 2021-06-27 MED ORDER — MECLIZINE HCL 25 MG PO TABS: 25.0000 mg | ORAL_TABLET | Freq: Three times a day (TID) | ORAL | 0 refills | Status: DC | PRN

## 2021-06-27 MED ORDER — CEPHALEXIN 500 MG PO CAPS
500.0000 mg | ORAL_CAPSULE | Freq: Four times a day (QID) | ORAL | 0 refills | Status: DC
Start: 2021-06-27 — End: 2021-07-07

## 2021-06-27 NOTE — ED Triage Notes (Signed)
Reports dizziness onset last night. ?Pt states the residence she moved into is being treated for bed bugs; states her spouse sprayed a treatment in the house, and she is also taking quetiapine -- her PCP office told pt the med may be causing the dizziness, as it's a listed side effect. ?Describes room spinning "when I lay on my back" or with any position changes. States no dizziness if she sits still. ?Denies any falls or head injuries. ?

## 2021-06-27 NOTE — Discharge Instructions (Addendum)
I believe that you have vertigo.  Please take meclizine up to 3 times a day as needed.  Urine was abnormal indicating a urinary tract infection.  This could be contributing to your symptoms.  Start Keflex as prescribed.  We will send this off for culture and contact you if we need to arrange any additional treatment.  It is possible that your medication is contributing to the symptoms but you have been on it for several weeks without problem.  If your symptoms not improve quickly with medication I would contact the prescriber and discuss if you need to change or alter this medication.  Make sure you are resting and drinking plenty of fluid.  Eat small frequent meals.  If your symptoms are not improving please contact ENT to schedule an appointment as you may benefit from vestibular physical therapy.  If you have any worsening symptoms including headache, nausea, vomiting, vision change, persistent/worsening dizziness, chest pain, shortness of breath you need to go to the emergency room immediately. ? ?Please follow-up with your PCP about your stool studies as we discussed. ?

## 2021-06-27 NOTE — ED Provider Notes (Signed)
?[facility] ? ? ? ?CSN: [id number] ?Arrival date & time: 06/27/21  1402 ? ? ?  ? ?History   ?Chief Complaint ?Chief Complaint  ?Patient presents with  ? Dizziness  ?  Being treated for bed bugs  ? ? ?HPI ?Kristen [name] is a 76 y.o. female.  ? ?Patient presents today with a 12-hour history of dizziness.  She describes this as a room spinning sensation that occurred when she first laid down at night.  It is worse whenever she moves her head and lasted for 30 seconds to a minute before resolving without intervention.  She denies episodes of similar symptoms in the past.  She denies any headache, nausea, vomiting, dysarthria, ataxia, visual change.  She does report that she was started on Seroquel on 06/15/2021 but denies additional medication changes since that time.  She denies any head injury.  Denies any recent illness.  She reports that symptoms are improved now that she is standing up and sitting up throughout the day but she is concerned that it may recur when she does lay down at night.  She does report that her husband sprayed for bedbugs that they have been dealing with an infestation prior to symptom onset but that they aired out the room and read the label to ensure it was nontoxic to humans. ? ?In addition, patient reports that after several days of constipation she had a loose bowel movement.  She is concerned that there is something in her stool.  She is in the process of arranging stool cultures with her PCP but has not yet been able to provide a sample.  She does report over the past several months she has lost weight.  She reports occasional abdominal pain but denies any significant symptoms today. ? ? ?Past Medical History:  ?Diagnosis Date  ? Arthritis   ? Blood transfusion without reported diagnosis   ? Bursitis   ? CAD (coronary artery disease)   ? a. s/p CABG 2004.b. stable cath 2014 demonstrating stable CAD and continued patency of her LIMA graft.  ? Chronic anticoagulation   ? on  coumadin  ? DDD (degenerative disc disease), lumbar   ? Depression   ? Diabetes mellitus   ? Diastolic dysfunction   ? per echo in October 2012 with EF 50 to 55%  ? Fibromyalgia   ? GERD (gastroesophageal reflux disease) 10/23/2003  ? Headache(784.0)   ? Hyperlipidemia   ? Hypertension   ? Hypokalemia   ? LBBB (left bundle branch block)   ? Lumbar back pain   ? LV dysfunction   ? a. EF 45% by cath 2014. b. EF 50-55% by technically difficult echo in 08/2014.  ? Lymphadenitis   ? Morbid obesity ([place])   ? a. Sleep study negative for significant OSA in 11/2014.  ? PAF (paroxysmal atrial fibrillation) ([place])   ? Stroke [facility]) 2004  ? affected speech per pt  ? ? ?Patient Active Problem List  ? Diagnosis Date Noted  ? Diarrhea of infectious origin 06/21/2021  ? Severe episode of recurrent major depressive disorder, with psychotic features ([place]) 06/15/2021  ? GAD (generalized anxiety disorder) 06/02/2021  ? Stage 3a chronic kidney disease ([place]) 03/17/2021  ? PAF (paroxysmal atrial fibrillation) ([place])   ? Thiamine deficiency 12/30/2018  ? Vitamin B12 deficiency anemia due to intrinsic factor deficiency 12/20/2018  ? CHF (congestive heart failure), NYHA class I, chronic, combined ([place]) 12/19/2018  ? Coronary artery disease involving native coronary artery of  native heart with angina pectoris ([place]) 12/19/2018  ? Obesity with body mass index (BMI) of 30.0 to 39.9 01/15/2018  ? Chronic idiopathic constipation 12/27/2017  ? Type 2 diabetes mellitus ([place]) 10/30/2017  ? Eczema 08/07/2017  ? Insomnia secondary to chronic pain 08/04/2016  ? Visit for screening mammogram 04/14/2016  ? LV dysfunction   ? Routine general medical examination at a health care facility 05/03/2013  ? DM (diabetes mellitus), type 2 with peripheral vascular complications ([place]) 80/99/8338  ? PVC's/nonsustained VT 10/24/2012  ? H/O: CVA (cerebrovascular accident) 02/09/2012  ? Hypothyroidism 05/27/2011  ? Fibromyalgia 10/08/2010  ? Long term current use of  anticoagulant 05/05/2010  ? Low back pain, non-specific 05/12/2008  ? Cognitive dysfunction associated with depression 01/29/2007  ? Osteoarthritis 11/22/2006  ? Hyperlipidemia associated with type 2 diabetes mellitus ([place]) 09/13/2006  ? Coronary atherosclerosis 09/13/2006  ? GERD 09/13/2006  ? Hypertension associated with diabetes ([place]) 08/30/2006  ? ? ?Past Surgical History:  ?Procedure Laterality Date  ? ABDOMINAL HYSTERECTOMY    ? ANGIOPLASTY  laminectomy  ? CHOLECYSTECTOMY    ? CORONARY ARTERY BYPASS GRAFT    ? LIMA to LAD   ? KNEE ARTHROSCOPY    ? LEFT HEART CATHETERIZATION WITH CORONARY/GRAFT ANGIOGRAM  12/07/2012  ? Procedure: LEFT HEART CATHETERIZATION WITH Beatrix Fetters;  Surgeon: Blane Ohara, MD;  Location: [facility] CATH LAB;  Service: Cardiovascular;;  ? LUMBAR LAMINECTOMY    ? x3  ? SPHINCTEROTOMY    ? TONSILLECTOMY    ? ? ?OB History   ?No obstetric history on file. ?  ? ? ? ?Home Medications   ? ?Prior to Admission medications   ?Medication Sig Start Date End Date Taking? Authorizing Provider  ?ALPRAZolam (XANAX) 1 MG tablet 1  qam  half  midday  half qhs 06/02/21  Yes [name], MD  ?atorvastatin (LIPITOR) 40 MG tablet TAKE ONE TABLET BY MOUTH DAILY 11/30/20  Yes [name], MD  ?cephALEXin (KEFLEX) 500 MG capsule Take 1 capsule (500 mg total) by mouth 4 (four) times daily. 06/27/21  Yes Tedric Leeth, Junie Panning K, PA-C  ?cyclobenzaprine (FLEXERIL) 10 MG tablet Take 10 mg by mouth 3 (three) times daily as needed for muscle spasms.   Yes [provider]  ?furosemide (LASIX) 20 MG tablet TAKE ONE TABLET BY MOUTH EVERY MORNING 11/30/20  Yes [name], MD  ?gabapentin (NEURONTIN) 300 MG capsule TAKE ONE CAPSULE BY MOUTH three times daily 10/29/20  Yes [name], MD  ?JARDIANCE 25 MG TABS tablet Take 1 tablet (25 mg total) by mouth daily. Via [employer] pt assistance 03/17/21  Yes [name], MD  ?magnesium oxide (MAG-OX) 400 (240 Mg) MG tablet Take 1 tablet (400 mg total) by  mouth 2 (two) times daily. Take in the morning and evening. 05/24/21  Yes Sherren Mocha, MD  ?meclizine (ANTIVERT) 25 MG tablet Take 1 tablet (25 mg total) by mouth 3 (three) times daily as needed for dizziness. 06/27/21  Yes Easten Maceachern, Derry Skill, PA-C  ?metFORMIN (GLUCOPHAGE-XR) 500 MG 24 hr tablet Take 3 tablets (1,500 mg total) by mouth daily with breakfast. 03/18/21  Yes [name], MD  ?PERCOCET 10-325 MG tablet Take 1 tablet by mouth 5 (five) times daily as needed. 03/13/19  Yes [provider]  ?potassium chloride SA (KLOR-CON) 20 MEQ tablet TAKE ONE TABLET BY MOUTH TWICE DAILY 11/30/20  Yes Sherren Mocha, MD  ?QUEtiapine (SEROQUEL) 50 MG tablet Take 1 tablet (50 mg total)  by mouth at bedtime. 06/15/21  Yes [name], MD  ?sacubitril-valsartan (ENTRESTO) 24-26 MG Take 1 tablet by mouth 2 (two) times daily. 06/23/21  Yes Sherren Mocha, MD  ?telmisartan (MICARDIS) 40 MG tablet Take 40 mg by mouth daily.   Yes [provider]  ?warfarin (COUMADIN) 3 MG tablet TAKE 1 TABLET DAILY BY MOUTH EXCEPT TAKE  1 AND 1/2 TABLETS ON MONDAYS OR AS DIRECTED BY ANTICOAGULATION CLINIC 06/01/21  Yes [name], MD  ?Ascorbic Acid (VITAMIN C) 1000 MG tablet Take 1,000 mg by mouth daily.    [provider]  ?Blood Glucose Monitoring Suppl (ONE TOUCH ULTRA 2) w/Device KIT Inject 1 Act into the skin 3 (three) times daily. Use TID 06/02/21   [name], MD  ?Cholecalciferol (VITAMIN D3) 50 MCG (2000 UT) capsule Take 2,000 Units by mouth daily.    [provider]  ?Continuous Blood Gluc Receiver (FREESTYLE LIBRE 2 READER) DEVI 1 Act by Does not apply route daily. 06/01/21   [name], MD  ?Continuous Blood Gluc Sensor (FREESTYLE LIBRE 2 SENSOR) MISC 1 Act by Does not apply route daily. 06/01/21   [name], MD  ?glucose blood (COOL BLOOD GLUCOSE TEST STRIPS) test strip 1 each by Other route 3 (three) times daily. Use to test blood sugar TID. DX: E11.9 06/02/21   [name],  MD  ?Lancets [facility] ULTRASOFT) lancets Use as instructed 05/18/19   [name], MD  ?metoprolol succinate (TOPROL-XL) 100 MG 24 hr tablet TAKE ONE TABLET BY MOUTH TWICE DAILY WITH OR immediately followin

## 2021-06-28 ENCOUNTER — Telehealth: Payer: Self-pay | Admitting: Cardiovascular Disease

## 2021-06-28 ENCOUNTER — Encounter: Payer: Self-pay | Admitting: Cardiovascular Disease

## 2021-06-28 ENCOUNTER — Telehealth: Payer: Self-pay | Admitting: Internal Medicine

## 2021-06-28 NOTE — Telephone Encounter (Signed)
Returned call to patient to inform her that her PharmD visit could be changed to a telephone or virtual visit IF she has a BP cuff at home, but she would need a BMET prior to this appt. Pt has coumadin clinic appt at different office on [address] scheduled for 5/2, so offered to make her lab appt for that day since she would already be commuting. Asked her to call back or send MyChart message to let us know what she'd like to do. Then will route back to Pharm D. ?

## 2021-06-28 NOTE — Telephone Encounter (Signed)
error 

## 2021-06-28 NOTE — Telephone Encounter (Signed)
Patient was changed to [facility] in the beginning of march. No BMP done since. This should be checked. We could do virtual apt if pt has a blood pressure cuff, but she would still need labs done before we could adjust medications. ?Did they do away with [facility] transportation? ?

## 2021-06-28 NOTE — Telephone Encounter (Signed)
Returned call to patient who states that she has been having transportation issues and rescheduled her PharmD appt, but was told she still needed to come in for blood work. Pt is now asking if this appt can be done over the phone. Will forward message to PharmD clinic to review (pt being seen for medication management of Entresto). ?

## 2021-06-28 NOTE — Telephone Encounter (Signed)
Patient calling with question on rather she need blood work done or not. Please advise  ?

## 2021-06-28 NOTE — Telephone Encounter (Signed)
Connected to [facility] 4.23.2023. ? ?Caller states she is having episodes of dizziness or swimmy headed when she is laying down. She is not having any other symptoms. She is okay if laying ?on her left side, but if laying on back or right side has this issue. She states that this only occurs when laying down, feels ok when standing up. BP last night 133/70s (patient unable to recall exact number); hx of ?atrial fibrillation and has had open heart surgery. States that she took her pulse and it seems regular. States that she has not been eating much, thinks that she may not have been drinking enough.  ? ? ?Advised to see HCP within 4 hours. Pt admitted to hospital.  ?

## 2021-06-29 ENCOUNTER — Ambulatory Visit: Payer: Medicare Other | Admitting: Licensed Clinical Social Worker

## 2021-06-29 ENCOUNTER — Telehealth: Payer: Self-pay

## 2021-06-29 DIAGNOSIS — F333 Major depressive disorder, recurrent, severe with psychotic symptoms: Secondary | ICD-10-CM

## 2021-06-29 DIAGNOSIS — F411 Generalized anxiety disorder: Secondary | ICD-10-CM

## 2021-06-29 LAB — URINE CULTURE

## 2021-06-29 NOTE — Patient Instructions (Addendum)
Visit Information ? ?Thank you for taking time to visit with me today. Please don't hesitate to contact me if I can be of assistance to you before our next scheduled telephone appointment. ? ?Following are the goals we discussed today: Coping Skills ? ?Task & activities to accomplish goals: ?Call one of the following to schedule your appointment ?[facility]  [address] [place],  [place] ?[place]  [address] [phone]  ?      [phone]   ?Continue with compliance of taking medication  ? ?Our next appointment is by telephone on May 17th at 3:00 ? ?Please call the care guide team at [phone] if you need to cancel or reschedule your appointment.  ? ?If you are experiencing a Mental Health or [place] or need someone to talk to, please call the Suicide and Crisis Lifeline: 988 ?call the [place] National Suicide Prevention Lifeline: 314-432-5440 or TTY: 832-452-8776 TTY 985-039-8008) to talk to a trained counselor ?call 1-800-273-TALK (toll free, 24 hour hotline) ?go to [facility] [address] [place] [phone])  ? ?Patient verbalizes understanding of instructions and care plan provided today and agrees to view in [place]. Active MyChart status confirmed with patient.   ? ?Casimer Lanius, LCSW ?Licensed Clinical Social Worker [facility]  ?[place]  ?[phone]  ?

## 2021-06-29 NOTE — Chronic Care Management (AMB) (Signed)
?Chronic Care Management  ? Clinical Social Work Note ? ?06/29/2021 ?Name: Kristen [name] MRN: [id number] DOB: 1946-01-24 ? ?Kristen [name] is a 76 y.o. year old female who is a primary care patient of Kristen Lima, MD. The CCM team was consulted to assist the patient with chronic disease management and/or care coordination needs related to: Mental Health Counseling and Resources.  ? ?Engaged with patient by telephone for follow up visit in response to provider referral for social work chronic care management and care coordination services.  ? ?Consent to Services:  ?The patient was given information about Chronic Care Management services, agreed to services, and gave verbal consent prior to initiation of services.  Please see initial visit note for detailed documentation.  ? ?Patient agreed to services and consent obtained.  ? ?Summary:  Patient and LCSW having difficulty locating a provider that will take insurance. Located two new options and discussed with patient today .  See Care Plan below for interventions and patient self-care actives. ? ?Recommendation: Patient may benefit from, and is in agreement to call both options to see which will work best for her.  ? ?Follow up Plan: Patient would like continued follow-up from CCM LCSW.  per patient's request will follow up in 3 weeks.  Will call office if needed prior to next encounter. ?  ?Assessment: Review of patient past medical history, allergies, medications, and health status, including review of relevant consultants reports was performed today as part of a comprehensive evaluation and provision of chronic care management and care coordination services.    ? ?SDOH (Social Determinants of Health) assessments and interventions performed:   ? ?Advanced Directives Status: Not addressed in this encounter. ? ?CCM Care Plan ?Conditions to be addressed/monitored: Anxiety and Depression;  ? ?Care Plan : LCSW Plan of Care  ?Updates made by Kristen Cane, LCSW since 06/29/2021 12:00 AM  ?  ? ?Problem: Coping Skills   ?  ? ?Goal: Coping Skills Enhanced   ?Start Date: 06/21/2021  ?This Visit's Progress: On track  ?Recent Progress: On track  ?Priority: High  ?Note:   ?Current Barriers:  ?Disease Management support and education needs related to Depression: anxiety ?Family and relationship dysfunction ? ?CSW Clinical Goal(s):  ?Patient  will demonstrate a reduction in symptoms related to :symptom reduction  through collaboration with Clinical Social Worker, provider, and care team.  ? ?Interventions: ?1:1 collaboration with primary care provider regarding development and update of comprehensive plan of care as evidenced by provider attestation and co-signature ?Inter-disciplinary care team collaboration (see longitudinal plan of care) ?Evaluation of current treatment plan related to  self management and patient's adherence to plan as established by provider ? ?Mental Health:  (Status: Goal on Track (progressing): YES.) ?Evaluation of current treatment plan related to Depression: anxiety ?Solution-Focused Strategies employed:  ?Active listening / Reflection utilized  ?Problem Solving /Task Center strategies reviewed ?Reviewed mental health medications and discussed importance of compliance: reports no missed doses of Seroquel and ViBryd ?Discussed referral for psychiatry: options discussed ?Collaborated with and called several agencies to locate provider for patient ? ?Task & activities to accomplish goals: ?Call one of the following to schedule your appointment ?[facility]  [address] [place],  [place] ?[place]  [address], [address] [phone]  ?      [phone]   ?Continue with compliance of taking medication  ? ?  ? Kristen Lanius, LCSW ?Licensed Clinical Social Worker [name]  ?[facility]  Kristen [name]  ?[phone]  ? ? ?

## 2021-06-29 NOTE — Telephone Encounter (Signed)
Pt reports bruise on upper arm and wants to know if it is caused by a bed bug bite. Pt denies any scratching due to the bite and no injury. Advised pt a bruise cannot be assessed over the phone but she can come into the office for an INR check. Pt was tearful that she is having difficulty with her husband and she is worried about infecting someone with bed bugs. Advised pt she could stay in the car and have her INR checked. Pt did not want to do that at this time.  ?Pt also reports she was in the ER on 4/23 and she just received the urine culture results from that visit and it recommended that another specimen be collected due to multiple species present. Pt was prescribed keflex which can enhance warfarin effect and increase bleeding risk. Pt was advised of this risk. Pt reports the clinic scheduled her for an apt with PCP for 5/2 but she only has 1 or 2 days of abx left and is not sure what she should do because she does not want to come inside the clinic. ? ?Advised pt if she came in for INR check she may also be able to leave a urine sample if PCP feels this is necessary. Pt was again upset that she is worried to infect anyone in the clinic with bed bugs.  ?Pt reported that PCP told her at her last apt that she may need to go to an inpt mental health facility for her concern over the bed bugs, but she does not think she needs to do that. She said if she thought she was that bad she would let her PCP know. Pt denies wanting to harm herself.  ?Pt had another call come in from another clinic, cardiology, and said she needed to go. Advised pt if she needs anything to call back. Advised if any further bruising or she developed any bleeding to contact the office or go to the ER. Pt denies any bleeding and verbalized understanding.  ?Pt reports the company that treated her home for bed bugs will not come back to verify the bugs are gone. She said she called them several times today because they were supposed be out 1  week ago, but they will not return her calls.  ?

## 2021-06-30 ENCOUNTER — Telehealth: Payer: Self-pay

## 2021-06-30 NOTE — Progress Notes (Signed)
? ? ?  Chronic Care Management ?Pharmacy Assistant  ? ?Name: Kristen [name]  MRN: [id number] DOB: [birth year] ? ? ?Contacted [employer]  to follow up on patient assistance application for [employer]. Per representative at [employer] application is processing.  Need stand along prescription faxed to pharmacy. Representative stated that a fax was sent out yesterday requesting prescription for Entresto. ? ?[name] ?Clinical Pharmacist Assistant ?[phone]  ? ? ?

## 2021-06-30 NOTE — Telephone Encounter (Signed)
Pt is calling stating that she has lost the pw that was given to her. Pt is requesting a call back. ?

## 2021-06-30 NOTE — Telephone Encounter (Signed)
Left message, new ozempic PAP placed in mail for patient  ? ?Patient to return call with any other issues or concerns ? ?Tomasa Blase, PharmD ?Clinical Pharmacist, [place]  ? ?

## 2021-06-30 NOTE — Telephone Encounter (Signed)
Entresto prescription faxed to [employer] as requested  ? ?Tomasa Blase, PharmD ?Clinical Pharmacist, [place]  ? ?

## 2021-07-01 ENCOUNTER — Ambulatory Visit: Payer: Medicare Other

## 2021-07-02 ENCOUNTER — Ambulatory Visit: Payer: Medicare Other

## 2021-07-02 DIAGNOSIS — I152 Hypertension secondary to endocrine disorders: Secondary | ICD-10-CM

## 2021-07-02 DIAGNOSIS — I480 Paroxysmal atrial fibrillation: Secondary | ICD-10-CM

## 2021-07-02 DIAGNOSIS — F411 Generalized anxiety disorder: Secondary | ICD-10-CM

## 2021-07-02 DIAGNOSIS — E1151 Type 2 diabetes mellitus with diabetic peripheral angiopathy without gangrene: Secondary | ICD-10-CM

## 2021-07-02 DIAGNOSIS — E1169 Type 2 diabetes mellitus with other specified complication: Secondary | ICD-10-CM

## 2021-07-02 NOTE — Progress Notes (Signed)
? ?Chronic Care Management ?Pharmacy Note ? ?07/02/2021 ?Name:  Kristen [name] MRN:  [id number] DOB:  [birth year] ? ?Summary: ?-Patient reports that since most recent urgent care visit - has stopped quetiapine and viibryd as it was thought it could be contributing to her dizziness  ?-Patient reports compliance to all of her other medications - has been doing well at remembering to take her insulin every day ?-BG averaging ~130 in the AM, ~180 after meals, and then back to in the 130's in the PM and hs  ?-Notes that BP is averaging typically ~120-130's/60-70's  - HR typically in the 90's  ?-Awaiting determination on patient assistance for entresto, jardiance, and ozempic (patient to bring in application with her next week when she is in to see PCP) ? ?Recommendations/Changes made from today's visit: ?-Recommending for patient to follow up with PCP as scheduled - is planning to discuss viibryd and quetiapine - pt ultimately would like to reduce alprazolam usage if she is able to  ?-Patient to continue monitoring BP and BG at home, to reach out should either become uncontrolled  ?-Will update patient when more is known about status of patient assistance applications  ? ? ? ?Subjective: ?Kristen [name] is an 76 y.o. year old female who is a primary patient of [name], MD.  The CCM team was consulted for assistance with disease management and care coordination needs.   ? ?Engaged with patient by telephone for follow up visit in response to provider referral for pharmacy case management and/or care coordination services.  ? ?Consent to Services:  ?The patient was given information about Chronic Care Management services, agreed to services, and gave verbal consent prior to initiation of services.  Please see initial visit note for detailed documentation.  ? ?Patient Care Team: ?[name], MD as PCP - General (Internal Medicine) ?Sherren Mocha, MD as PCP - Cardiology (Cardiology) ?Deboraha Sprang,  MD as Consulting Physician (Cardiology) ?Shon Hough, MD as Consulting Physician (Ophthalmology) ?Knox Royalty, RN as Case Manager ?Melissa Noon, [place] as Referring Physician (Optometry) ?Tomasa Blase, [facility] as Pharmacist (Pharmacist) ?[name], LCSW as Education officer, museum (Licensed Holiday representative) ? ?Recent office visits: ?06/15/2021 - Dr. Ronnald Ramp - started on quetiapine and vilazodone, referred to [place]  ?03/17/2021 - Dr. Ronnald Ramp - no medication changes - f/u in 6 months  ? ?Recent consult visits: ?04/30/2021 - Dr. Burt Knack  - Cardiology - stable, no changes to medications - f/u in 6 months  ? ?Hospital visits: ?06/27/2021 - Urgent Care - dizziness / constipation - UTI noted - cephalexin and meclizine rx'd  ? ? ?Objective: ? ?Lab Results  ?Component Value Date  ? CREATININE 0.96 03/17/2021  ? BUN 19 03/17/2021  ? GFR 58.81 (L) 03/17/2021  ? GFRNONAA 64 10/16/2019  ? GFRAA 75 10/16/2019  ? NA 138 03/17/2021  ? K 4.3 03/17/2021  ? CALCIUM 10.2 03/17/2021  ? CO2 22 03/17/2021  ? GLUCOSE 137 (H) 03/17/2021  ? ? ?Lab Results  ?Component Value Date/Time  ? HGBA1C 8.2 (H) 03/17/2021 01:51 PM  ? HGBA1C 8.6 (H) 09/21/2020 02:25 PM  ? GFR 58.81 (L) 03/17/2021 01:51 PM  ? GFR 70.27 09/21/2020 02:25 PM  ? MICROALBUR <0.7 09/21/2020 02:25 PM  ? MICROALBUR <0.7 04/11/2019 02:55 PM  ?  ?Last diabetic Eye exam:  ?Lab Results  ?Component Value Date/Time  ? HMDIABEYEEXA No Retinopathy 11/19/2020 12:00 AM  ?  ?Last diabetic Foot exam:  ?Lab Results  ?  Component Value Date/Time  ? HMDIABFOOTEX normal 04/02/2014 12:00 AM  ?  ? ?Lab Results  ?Component Value Date  ? CHOL 98 09/21/2020  ? HDL 34.60 (L) 09/21/2020  ? LDLCALC 39 09/21/2020  ? LDLDIRECT 116.0 11/28/2008  ? TRIG 126.0 09/21/2020  ? CHOLHDL 3 09/21/2020  ? ? ? ?  Latest Ref Rng & Units 09/21/2020  ?  2:25 PM 04/11/2019  ?  2:55 PM 12/19/2018  ?  5:02 PM  ?Hepatic Function  ?Total Protein 6.0 - 8.3 g/dL 8.0   8.0   7.8    ?Albumin 3.5 - 5.2 g/dL 4.5    4.4   4.9    ?AST 0 - 37 U/L $Remo'18   12   15    'axIyB$ ?ALT 0 - 35 U/L $Remo'14   13   12    'jTSvT$ ?Alk Phosphatase 39 - 117 U/L 50   53   47    ?Total Bilirubin 0.2 - 1.2 mg/dL 0.5   0.4   1.5    ?Bilirubin, Direct 0.0 - 0.3 mg/dL 0.1   0.1   0.3    ? ? ?Lab Results  ?Component Value Date/Time  ? TSH 3.99 03/17/2021 01:51 PM  ? TSH 2.26 09/21/2020 02:25 PM  ? FREET4 0.73 09/27/2013 01:28 PM  ? FREET4 0.68 05/07/2012 09:44 AM  ? ? ? ?  Latest Ref Rng & Units 09/21/2020  ?  2:25 PM 10/10/2019  ?  3:46 PM 04/11/2019  ?  2:55 PM  ?CBC  ?WBC 4.0 - 10.5 K/uL 6.0   6.0   6.5    ?Hemoglobin 12.0 - 15.0 g/dL 12.6   12.2   13.0    ?Hematocrit 36.0 - 46.0 % 39.4   39.8   41.3    ?Platelets 150.0 - 400.0 K/uL 238.0   308   398.0    ? ? ?No results found for: VD25OH ? ?Clinical ASCVD: Yes  ?The ASCVD Risk score (Arnett DK, et al., 2019) failed to calculate for the following reasons: ?  The valid total cholesterol range is 130 to 320 mg/dL   ? ? ?  01/25/2021  ?  3:30 PM 01/25/2021  ?  3:25 PM 01/07/2021  ?  3:30 PM  ?Depression screen PHQ 2/9  ?Decreased Interest 0 0 1  ?Down, Depressed, Hopeless 0 0 0  ?PHQ - 2 Score 0 0 1  ?  ? ?Social History  ? ?Tobacco Use  ?Smoking Status Former  ? Types: Cigarettes  ? Quit date: 03/07/2001  ? Years since quitting: 20.3  ?Smokeless Tobacco Never  ? ?BP Readings from Last 3 Encounters:  ?06/27/21 124/82  ?06/15/21 118/76  ?04/30/21 110/74  ? ?Pulse Readings from Last 3 Encounters:  ?06/27/21 89  ?06/15/21 94  ?04/30/21 92  ? ?Wt Readings from Last 3 Encounters:  ?06/15/21 179 lb (81.2 kg)  ?04/30/21 186 lb 9.6 oz (84.6 kg)  ?03/17/21 191 lb (86.6 kg)  ? ?BMI Readings from Last 3 Encounters:  ?06/15/21 33.82 kg/m?  ?04/30/21 35.26 kg/m?  ?03/17/21 36.09 kg/m?  ? ? ?Assessment/Interventions: Review of patient past medical history, allergies, medications, health status, including review of consultants reports, laboratory and other test data, was performed as part of comprehensive evaluation and provision of chronic care  management services.  ? ?SDOH:  (Social Determinants of Health) assessments and interventions performed: Yes ? ?SDOH Screenings  ? ?Alcohol Screen: Low Risk   ? Last Alcohol Screening Score (AUDIT): 0  ?Depression (PHQ2-9):  Low Risk   ? PHQ-2 Score: 0  ?Financial Resource Strain: Medium Risk  ? Difficulty of Paying Living Expenses: Somewhat hard  ?Food Insecurity: No Food Insecurity  ? Worried About Charity fundraiser in the Last Year: Never true  ? Ran Out of Food in the Last Year: Never true  ?Housing: Low Risk   ? Last Housing Risk Score: 0  ?Physical Activity: Inactive  ? Days of Exercise per Week: 0 days  ? Minutes of Exercise per Session: 0 min  ?Social Connections: Moderately Isolated  ? Frequency of Communication with Friends and Family: Twice a week  ? Frequency of Social Gatherings with Friends and Family: Twice a week  ? Attends Religious Services: Never  ? Active Member of Clubs or Organizations: No  ? Attends Archivist Meetings: Never  ? Marital Status: Married  ?Stress: Stress Concern Present  ? Feeling of Stress : Rather much  ?Tobacco Use: Medium Risk  ? Smoking Tobacco Use: Former  ? Smokeless Tobacco Use: Never  ? Passive Exposure: Not on file  ?Transportation Needs: No Transportation Needs  ? Lack of Transportation (Medical): No  ? Lack of Transportation (Non-Medical): No  ? ? ?CCM Care Plan ? ?Allergies  ?Allergen Reactions  ? Metformin And Related Other (See Comments)  ?  Must take XR form only, cannot tolerate Regular release metformin  ? Cleocin [Clindamycin Hcl] Diarrhea  ? Codeine Itching  ? Doxycycline Hyclate Diarrhea  ? Macrolides And Ketolides Diarrhea  ? Morphine Hives  ? Pentazocine Lactate Itching and Nausea And Vomiting  ?  (GENERIC- [place])  ? Vibramycin [Doxycycline Calcium] Diarrhea and Rash  ? Clindamycin Diarrhea  ? Definity [Perflutren Lipid Microsphere] Other (See Comments)  ?  Patient complained of warm sensation in chest x few seconds duration when injected  Definity.No other symptoms  ? Doxycycline Diarrhea  ? Metformin Other (See Comments)  ? Pentazocine Nausea Only  ? Perflutren Other (See Comments)  ? Perflutren Lipid Microspheres Other (See Comments)  ?

## 2021-07-02 NOTE — Patient Instructions (Signed)
Visit Information ? ?Following are the goals we discussed today:  ? ?Manage My Medicine  ? ?Timeframe:  Long-Range Goal ?Priority:  High ?Start Date:      07/06/20                       ?Expected End Date:   07/02/2021             ? ?Follow Up Date 10/2021 ?  ?- call for medicine refill 2 or 3 days before it runs out ?- call if I am sick and can't take my medicine ?- keep a list of all the medicines I take; vitamins and herbals too  ?-check glucose daily, document, and provide at future appointments ?-check blood pressure daily, document, and provide at future appointments ?-Limit carbs to <50 g per meal and <15 g per snack ?  ?Why is this important?   ?These steps will help you keep on track with your medicines. ? ?Plan: Telephone follow up appointment with care management team member scheduled for:  4 months ?The patient has been provided with contact information for the care management team and has been advised to call with any health related questions or concerns.  ? ?Tomasa Blase, PharmD ?Clinical Pharmacist, [place]  ? ?Please call the care guide team at [phone] if you need to cancel or reschedule your appointment.  ? ?Patient verbalizes understanding of instructions and care plan provided today and agrees to view in [place]. Active MyChart status confirmed with patient.   ? ?

## 2021-07-03 ENCOUNTER — Encounter: Payer: Self-pay | Admitting: Internal Medicine

## 2021-07-04 DIAGNOSIS — E1159 Type 2 diabetes mellitus with other circulatory complications: Secondary | ICD-10-CM | POA: Diagnosis not present

## 2021-07-04 DIAGNOSIS — I110 Hypertensive heart disease with heart failure: Secondary | ICD-10-CM

## 2021-07-04 DIAGNOSIS — E785 Hyperlipidemia, unspecified: Secondary | ICD-10-CM | POA: Diagnosis not present

## 2021-07-04 DIAGNOSIS — I5040 Unspecified combined systolic (congestive) and diastolic (congestive) heart failure: Secondary | ICD-10-CM | POA: Diagnosis not present

## 2021-07-04 DIAGNOSIS — Z7984 Long term (current) use of oral hypoglycemic drugs: Secondary | ICD-10-CM

## 2021-07-04 DIAGNOSIS — I4891 Unspecified atrial fibrillation: Secondary | ICD-10-CM

## 2021-07-04 DIAGNOSIS — Z794 Long term (current) use of insulin: Secondary | ICD-10-CM | POA: Diagnosis not present

## 2021-07-04 DIAGNOSIS — F32A Depression, unspecified: Secondary | ICD-10-CM

## 2021-07-05 ENCOUNTER — Emergency Department (HOSPITAL_COMMUNITY): Payer: Medicare Other

## 2021-07-05 ENCOUNTER — Emergency Department (HOSPITAL_COMMUNITY)
Admission: EM | Admit: 2021-07-05 | Discharge: 2021-07-05 | Disposition: A | Discharge status: Home / Self Care | Payer: Medicare Other | Attending: Emergency Medicine | Admitting: Emergency Medicine

## 2021-07-05 DIAGNOSIS — Z7984 Long term (current) use of oral hypoglycemic drugs: Secondary | ICD-10-CM | POA: Diagnosis not present

## 2021-07-05 DIAGNOSIS — R109 Unspecified abdominal pain: Secondary | ICD-10-CM | POA: Diagnosis not present

## 2021-07-05 DIAGNOSIS — R1030 Lower abdominal pain, unspecified: Secondary | ICD-10-CM | POA: Diagnosis not present

## 2021-07-05 DIAGNOSIS — Z981 Arthrodesis status: Secondary | ICD-10-CM | POA: Diagnosis not present

## 2021-07-05 DIAGNOSIS — Q433 Congenital malformations of intestinal fixation: Secondary | ICD-10-CM | POA: Diagnosis not present

## 2021-07-05 DIAGNOSIS — M545 Low back pain, unspecified: Secondary | ICD-10-CM | POA: Insufficient documentation

## 2021-07-05 DIAGNOSIS — M549 Dorsalgia, unspecified: Secondary | ICD-10-CM | POA: Diagnosis present

## 2021-07-05 DIAGNOSIS — R079 Chest pain, unspecified: Secondary | ICD-10-CM | POA: Diagnosis not present

## 2021-07-05 DIAGNOSIS — Z79899 Other long term (current) drug therapy: Secondary | ICD-10-CM | POA: Diagnosis not present

## 2021-07-05 DIAGNOSIS — I700 Atherosclerosis of aorta: Secondary | ICD-10-CM | POA: Diagnosis not present

## 2021-07-05 LAB — BASIC METABOLIC PANEL
Anion gap: 10 (ref 5–15)
BUN: 17 mg/dL (ref 8–23)
CO2: 18 mmol/L — ABNORMAL LOW (ref 22–32)
Calcium: 9.7 mg/dL (ref 8.9–10.3)
Chloride: 110 mmol/L (ref 98–111)
Creatinine, Ser: 0.93 mg/dL (ref 0.44–1.00)
GFR, Estimated: >60 mL/min (ref >60)
Glucose, Bld: 175 mg/dL — ABNORMAL HIGH (ref 70–99)
Potassium: 4.8 mmol/L (ref 3.5–5.1)
Sodium: 138 mmol/L (ref 135–145)

## 2021-07-05 LAB — CBC
HCT: 41.8 % (ref 36.0–46.0)
Hemoglobin: 12.8 g/dL (ref 12.0–15.0)
MCH: 26.9 pg (ref 26.0–34.0)
MCHC: 30.6 g/dL (ref 30.0–36.0)
MCV: 87.8 fL (ref 80.0–100.0)
Platelets: 402 10*3/uL — ABNORMAL HIGH (ref 150–400)
RBC: 4.76 MIL/uL (ref 3.87–5.11)
RDW: 17.8 % — ABNORMAL HIGH (ref 11.5–15.5)
WBC: 6.5 10*3/uL (ref 4.0–10.5)
nRBC: 0 % (ref 0.0–0.2)

## 2021-07-05 LAB — TROPONIN I (HIGH SENSITIVITY)
Troponin I (High Sensitivity): 4 ng/L (ref <18)
Troponin I (High Sensitivity): 3 ng/L (ref <18)

## 2021-07-05 LAB — URINALYSIS, ROUTINE W REFLEX MICROSCOPIC
Bacteria, UA: NONE SEEN
Bilirubin Urine: NEGATIVE
Glucose, UA: >=500 mg/dL — AB
Hgb urine dipstick: NEGATIVE
Ketones, ur: NEGATIVE mg/dL
Leukocytes,Ua: NEGATIVE
Nitrite: NEGATIVE
Protein, ur: NEGATIVE mg/dL
Specific Gravity, Urine: 1.021 (ref 1.005–1.030)
pH: 5 (ref 5.0–8.0)

## 2021-07-05 LAB — LIPASE, BLOOD: Lipase: 29 U/L (ref 11–51)

## 2021-07-05 NOTE — ED Provider Triage Note (Signed)
Emergency Medicine Provider Triage Evaluation Note ? ?Kristen [name] , a 75 y.o. female  was evaluated in triage.  Pt complains of lower abdominal pain, back pain, shortness of breath and anxiety for the last week.  Patient states that she has a history of open heart surgery.  Patient reports that she recently wore a Holter monitor but is unsure what the results were.  Patient sees Dr. Burt Knack of cardiology.  Patient states that she had a "twinge" of chest pain earlier however that has resolved.  Patient reports that she was seen in urgent care 1 week ago for these complaints and diagnosed with UTI and given antibiotics.  The patient denies any relief of symptoms with antibiotics.  Patient denies any dysuria over the last week. ? ?Review of Systems  ?Positive:  ?Negative:  ? ?Physical Exam  ?BP 124/73 (BP Location: Left Arm)   Pulse 88   Temp 98.6 ?F (37 ?C) (Oral)   Resp 18   SpO2 97%  ?Gen:   Awake, no distress   ?Resp:  Normal effort  ?MSK:   Moves extremities without difficulty  ?Other:   ? ?Medical Decision Making  ?Medically screening exam initiated at 1:24 PM.  Appropriate orders placed.  Kristen [name] was informed that the remainder of the evaluation will be completed by another provider, this initial triage assessment does not replace that evaluation, and the importance of remaining in the ED until their evaluation is complete. ? ? ?  ?Azucena Cecil, PA-C ?07/05/21 1325 ? ?

## 2021-07-05 NOTE — Discharge Instructions (Addendum)
You have been seen and discharged from the emergency department.  Your blood work, urine and CT scan were normal. Follow-up with your primary provider for further evaluation and further care. Take home medications as prescribed. If you have any worsening symptoms or further concerns for your health please return to an emergency department for further evaluation. ?

## 2021-07-05 NOTE — ED Provider Notes (Signed)
?[place] ?Provider Note ? ? ?CSN: [id number] ?Arrival date & time: 07/05/21  1225 ? ?  ? ?History ? ?Chief Complaint  ?Patient presents with  ? Abdominal Pain  ? Back Pain  ? ? ?Kristen [name] is a 76 y.o. female. ? ?HPI ? ?76 year old female presents emergency department with lower abdominal/back/flank pain for the past week.  She recently was diagnosed with a UTI and given antibiotics that she finished yesterday.  She states since then she is having flank and suprapubic ongoing pain.  She has also been having intermittent episodes of chest pain that she describes as a twinge in her left upper chest that is intermittent, self resolved, sporadic, not exertional.  No associated shortness of breath. She denies any fever, nausea/vomiting, chest pain, shortness of breath. ? ?Home Medications ?Prior to Admission medications   ?Medication Sig Start Date End Date Taking? Authorizing Provider  ?ALPRAZolam (XANAX) 1 MG tablet 1  qam  half  midday  half qhs 06/02/21   [name], MD  ?Ascorbic Acid (VITAMIN C) 1000 MG tablet Take 1,000 mg by mouth daily. ?Patient not taking: Reported on 07/02/2021    [provider]  ?atorvastatin (LIPITOR) 40 MG tablet TAKE ONE TABLET BY MOUTH DAILY 11/30/20   [name], MD  ?Blood Glucose Monitoring Suppl (ONE TOUCH ULTRA 2) w/Device KIT Inject 1 Act into the skin 3 (three) times daily. Use TID 06/02/21   [name], MD  ?cephALEXin (KEFLEX) 500 MG capsule Take 1 capsule (500 mg total) by mouth 4 (four) times daily. 06/27/21   Raspet, Derry Skill, PA-C  ?Cholecalciferol (VITAMIN D3) 50 MCG (2000 UT) capsule Take 2,000 Units by mouth daily. ?Patient not taking: Reported on 07/02/2021    [provider]  ?Continuous Blood Gluc Receiver (FREESTYLE LIBRE 2 READER) DEVI 1 Act by Does not apply route daily. 06/01/21   [name], MD  ?Continuous Blood Gluc Sensor (FREESTYLE LIBRE 2 SENSOR) MISC 1 Act by Does not apply route  daily. 06/01/21   [name], MD  ?cyclobenzaprine (FLEXERIL) 10 MG tablet Take 10 mg by mouth 3 (three) times daily as needed for muscle spasms.    [provider]  ?furosemide (LASIX) 20 MG tablet TAKE ONE TABLET BY MOUTH EVERY MORNING 11/30/20   [name], MD  ?gabapentin (NEURONTIN) 300 MG capsule TAKE ONE CAPSULE BY MOUTH three times daily 10/29/20   [name], MD  ?glucose blood (COOL BLOOD GLUCOSE TEST STRIPS) test strip 1 each by Other route 3 (three) times daily. Use to test blood sugar TID. DX: E11.9 06/02/21   [name], MD  ?JARDIANCE 25 MG TABS tablet Take 1 tablet (25 mg total) by mouth daily. Via [employer] pt assistance 03/17/21   [name], MD  ?Lancets [facility] ULTRASOFT) lancets Use as instructed 05/18/19   [name], MD  ?magnesium oxide (MAG-OX) 400 (240 Mg) MG tablet Take 1 tablet (400 mg total) by mouth 2 (two) times daily. Take in the morning and evening. 05/24/21   Sherren Mocha, MD  ?meclizine (ANTIVERT) 25 MG tablet Take 1 tablet (25 mg total) by mouth 3 (three) times daily as needed for dizziness. 06/27/21   Raspet, Derry Skill, PA-C  ?metFORMIN (GLUCOPHAGE-XR) 500 MG 24 hr tablet Take 3 tablets (1,500 mg total) by mouth daily with breakfast. 03/18/21   [name], MD  ?metoprolol succinate (TOPROL-XL) 100 MG 24 hr tablet TAKE  ONE TABLET BY MOUTH TWICE DAILY WITH OR immediately following A meal 11/30/20   Sherren Mocha, MD  ?nitroGLYCERIN (NITROSTAT) 0.4 MG SL tablet Place 1 tablet (0.4 mg total) under the tongue every 5 (five) minutes as needed for chest pain (x 3 doses). Reported on 05/08/2015 03/11/20   Sherren Mocha, MD  ?NOVOFINE PEN NEEDLE 32G X 6 MM MISC USE with insulin AS DIRECTED 01/25/21   [name], MD  ?PERCOCET 10-325 MG tablet Take 1 tablet by mouth 5 (five) times daily as needed. 03/13/19   [provider]  ?potassium chloride SA (KLOR-CON) 20 MEQ tablet TAKE ONE TABLET BY MOUTH TWICE DAILY 11/30/20   Sherren Mocha, MD   ?QUEtiapine (SEROQUEL) 50 MG tablet Take 1 tablet (50 mg total) by mouth at bedtime. ?Patient not taking: Reported on 07/02/2021 06/15/21   [name], MD  ?sacubitril-valsartan (ENTRESTO) 24-26 MG Take 1 tablet by mouth 2 (two) times daily. 06/23/21   Sherren Mocha, MD  ?Semaglutide, 2 MG/DOSE, (OZEMPIC, 2 MG/DOSE,) 8 MG/3ML SOPN Inject 2 mg into the skin once a week. ?Patient not taking: Reported on 07/02/2021 03/18/21   [name], MD  ?spironolactone (ALDACTONE) 25 MG tablet TAKE ONE TABLET BY MOUTH ONCE DAILY 05/23/21   [name], MD  ?thiamine 100 MG tablet Take 1 tablet (100 mg total) by mouth daily. 04/15/19   [name], MD  ?Nelva Nay MAX SOLOSTAR 300 UNIT/ML Solostar Pen INJECT 50 UNITS SUBCUTANEOUSLY DAILY 10/29/20   [name], MD  ?Vilazodone HCl 20 MG TABS Take 1 tablet (20 mg total) by mouth daily. ?Patient not taking: Reported on 07/02/2021 06/20/21   [name], MD  ?warfarin (COUMADIN) 3 MG tablet TAKE 1 TABLET DAILY BY MOUTH EXCEPT TAKE  1 AND 1/2 TABLETS ON MONDAYS OR AS DIRECTED BY ANTICOAGULATION CLINIC 06/01/21   [name], MD  ?   ? ?Allergies    ?Metformin and related, Cleocin [clindamycin hcl], Codeine, Doxycycline hyclate, Macrolides and ketolides, Morphine, Pentazocine lactate, Vibramycin [doxycycline calcium], Clindamycin, Definity [perflutren lipid microsphere], Doxycycline, Metformin, Pentazocine, Perflutren, Perflutren lipid microspheres, Sulfa antibiotics, and Sulfonamide derivatives   ? ?Review of Systems   ?Review of Systems  ?Constitutional:  Negative for chills and fever.  ?Respiratory:  Negative for chest tightness and shortness of breath.   ?Cardiovascular:  Positive for chest pain. Negative for palpitations and leg swelling.  ?Gastrointestinal:  Negative for abdominal pain, diarrhea and vomiting.  ?Genitourinary:  Positive for flank pain and frequency. Negative for vaginal bleeding, vaginal discharge and vaginal pain.  ?Musculoskeletal:  Negative for  back pain.  ?Skin:  Negative for rash.  ?Neurological:  Negative for headaches.  ? ?Physical Exam ?Updated Vital Signs ?BP 123/74 (BP Location: Left Arm)   Pulse 88   Temp 98.2 ?F (36.8 ?C) (Oral)   Resp 16   SpO2 99%  ?Physical Exam ?Vitals and nursing note reviewed.  ?Constitutional:   ?   General: She is not in acute distress. ?   Appearance: Normal appearance. She is not diaphoretic.  ?HENT:  ?   Head: Normocephalic.  ?   Mouth/Throat:  ?   Mouth: Mucous membranes are moist.  ?Cardiovascular:  ?   Rate and Rhythm: Normal rate.  ?Pulmonary:  ?   Effort: Pulmonary effort is normal. No respiratory distress.  ?Abdominal:  ?   General: There is no distension.  ?   Palpations: Abdomen is soft.  ?   Tenderness: There is no abdominal  tenderness. There is no guarding or rebound.  ?Skin: ?   General: Skin is warm.  ?Neurological:  ?   Mental Status: She is alert and oriented to person, place, and time. Mental status is at baseline.  ?Psychiatric:     ?   Mood and Affect: Mood normal.  ? ? ?ED Results / Procedures / Treatments   ?Labs ?(all labs ordered are listed, but only abnormal results are displayed) ?Labs Reviewed  ?BASIC METABOLIC PANEL - Abnormal; Notable for the following components:  ?    Result Value  ? CO2 18 (*)   ? Glucose, Bld 175 (*)   ? All other components within normal limits  ?CBC - Abnormal; Notable for the following components:  ? RDW 17.8 (*)   ? Platelets 402 (*)   ? All other components within normal limits  ?URINALYSIS, ROUTINE W REFLEX MICROSCOPIC - Abnormal; Notable for the following components:  ? Color, Urine STRAW (*)   ? Glucose, UA >=500 (*)   ? All other components within normal limits  ?LIPASE, BLOOD  ?TROPONIN I (HIGH SENSITIVITY)  ?TROPONIN I (HIGH SENSITIVITY)  ? ? ?EKG ?None ? ?Radiology ?DG Chest 1 View ? ?Result Date: 07/05/2021 ?CLINICAL DATA:  Chest pain. EXAM: CHEST  1 VIEW COMPARISON:  02/05/2016 FINDINGS: The cardiac silhouette, mediastinal and hilar contours are normal. Mild  tortuosity and calcification of the thoracic aorta. Stable surgical changes with median sternotomy wires. Streaky left basilar scarring changes. No infiltrates, edema or effusions. No pulmonary lesions. The b

## 2021-07-05 NOTE — ED Notes (Signed)
Patient transported to CT 

## 2021-07-05 NOTE — ED Triage Notes (Signed)
Pt. Stated, I have lower abdominal with back pain. I just finished today the medicine for a UTI. ?

## 2021-07-06 ENCOUNTER — Ambulatory Visit (INDEPENDENT_AMBULATORY_CARE_PROVIDER_SITE_OTHER): Payer: Medicare Other

## 2021-07-06 ENCOUNTER — Telehealth: Payer: Self-pay | Admitting: Cardiovascular Disease

## 2021-07-06 DIAGNOSIS — Z7901 Long term (current) use of anticoagulants: Secondary | ICD-10-CM

## 2021-07-06 LAB — POCT INR: INR: 2.2 (ref 2.0–3.0)

## 2021-07-06 NOTE — Progress Notes (Signed)
Pt had been prescribed keflex for UTI and just finished the abx one day ago. ? ?Continue 1 tablet daily except take 1 1/2 tablets on Mondays. Re-check in 3 weeks.  ?

## 2021-07-06 NOTE — Telephone Encounter (Signed)
ED note from 07/05/21 by Lavenia Atlas, D.O. ? ?Cardiac work-up is negative, very low suspicion for PE, very low suspicion for dissection.  Chest x-ray is unremarkable.  She is currently pain-free and symptom-free.  No findings of any emergent condition at this visit.  She is well-appearing, ambulating to the bathroom without difficulty.  Plan for outpatient follow-up.  Patient at this time appears safe and stable for discharge and close outpatient follow up. Discharge plan and strict return to ED precautions discussed, patient verbalizes understanding and agreement. ?  ? ? ? ?I will FYI Dr.Cooper ?

## 2021-07-06 NOTE — Patient Instructions (Addendum)
Pre visit review using our clinic review tool, if applicable. No additional management support is needed unless otherwise documented below in the visit note. ? ?Continue 1 tablet daily except take 1 1/2 tablets on Mondays. Re-check in 3 weeks.  ?

## 2021-07-06 NOTE — Telephone Encounter (Signed)
STAT if patient feels like he/she is going to faint  ? ?Are you dizzy now? no ? ?Do you feel faint or have you passed out? no ? ?Do you have any other symptoms? Patient just feels "swimmy headed" ? ?Have you checked your HR and BP (record if available)?  ? 123/74 (BP Location: Left Arm)   Pulse 88- taken yesterday at the ED  ? ? ?Patient was told by the ED she had vertigo. She wanted to make sure nothing was wrong with her heart  ?

## 2021-07-07 ENCOUNTER — Telehealth: Payer: Self-pay

## 2021-07-07 ENCOUNTER — Ambulatory Visit (INDEPENDENT_AMBULATORY_CARE_PROVIDER_SITE_OTHER): Payer: Medicare Other | Admitting: Internal Medicine

## 2021-07-07 ENCOUNTER — Encounter: Payer: Self-pay | Admitting: Internal Medicine

## 2021-07-07 VITALS — BP 122/78 | HR 88 | Temp 97.8°F | Resp 16 | Ht 61.0 in | Wt 182.0 lb

## 2021-07-07 DIAGNOSIS — N1831 Chronic kidney disease, stage 3a: Secondary | ICD-10-CM

## 2021-07-07 DIAGNOSIS — E785 Hyperlipidemia, unspecified: Secondary | ICD-10-CM

## 2021-07-07 DIAGNOSIS — Z794 Long term (current) use of insulin: Secondary | ICD-10-CM | POA: Diagnosis not present

## 2021-07-07 DIAGNOSIS — E1169 Type 2 diabetes mellitus with other specified complication: Secondary | ICD-10-CM

## 2021-07-07 DIAGNOSIS — E039 Hypothyroidism, unspecified: Secondary | ICD-10-CM

## 2021-07-07 DIAGNOSIS — E519 Thiamine deficiency, unspecified: Secondary | ICD-10-CM

## 2021-07-07 DIAGNOSIS — R29898 Other symptoms and signs involving the musculoskeletal system: Secondary | ICD-10-CM | POA: Diagnosis not present

## 2021-07-07 DIAGNOSIS — E1151 Type 2 diabetes mellitus with diabetic peripheral angiopathy without gangrene: Secondary | ICD-10-CM

## 2021-07-07 DIAGNOSIS — R42 Dizziness and giddiness: Secondary | ICD-10-CM

## 2021-07-07 DIAGNOSIS — D510 Vitamin B12 deficiency anemia due to intrinsic factor deficiency: Secondary | ICD-10-CM | POA: Diagnosis not present

## 2021-07-07 DIAGNOSIS — F411 Generalized anxiety disorder: Secondary | ICD-10-CM

## 2021-07-07 DIAGNOSIS — E119 Type 2 diabetes mellitus without complications: Secondary | ICD-10-CM

## 2021-07-07 DIAGNOSIS — E1149 Type 2 diabetes mellitus with other diabetic neurological complication: Secondary | ICD-10-CM | POA: Diagnosis not present

## 2021-07-07 DIAGNOSIS — F22 Delusional disorders: Secondary | ICD-10-CM

## 2021-07-07 LAB — CBC WITH DIFFERENTIAL/PLATELET
Basophils Absolute: 0 10*3/uL (ref 0.0–0.1)
Basophils Relative: 0.4 % (ref 0.0–3.0)
Eosinophils Absolute: 0 10*3/uL (ref 0.0–0.7)
Eosinophils Relative: 0.7 % (ref 0.0–5.0)
HCT: 39.9 % (ref 36.0–46.0)
Hemoglobin: 12.8 g/dL (ref 12.0–15.0)
Lymphocytes Relative: 39.7 % (ref 12.0–46.0)
Lymphs Abs: 1.8 10*3/uL (ref 0.7–4.0)
MCHC: 32.1 g/dL (ref 30.0–36.0)
MCV: 84.3 fl (ref 78.0–100.0)
Monocytes Absolute: 0.4 10*3/uL (ref 0.1–1.0)
Monocytes Relative: 10 % (ref 3.0–12.0)
Neutro Abs: 2.2 10*3/uL (ref 1.4–7.7)
Neutrophils Relative %: 49.2 % (ref 43.0–77.0)
Platelets: 375 10*3/uL (ref 150.0–400.0)
RBC: 4.74 Mil/uL (ref 3.87–5.11)
RDW: 18.5 % — ABNORMAL HIGH (ref 11.5–15.5)
WBC: 4.4 10*3/uL (ref 4.0–10.5)

## 2021-07-07 LAB — BASIC METABOLIC PANEL
BUN: 22 mg/dL (ref 6–23)
CO2: 22 mEq/L (ref 19–32)
Calcium: 10.3 mg/dL (ref 8.4–10.5)
Chloride: 102 mEq/L (ref 96–112)
Creatinine, Ser: 0.91 mg/dL (ref 0.40–1.20)
GFR: 62.58 mL/min (ref >60.00)
Glucose, Bld: 191 mg/dL — ABNORMAL HIGH (ref 70–99)
Potassium: 3.7 mEq/L (ref 3.5–5.1)
Sodium: 136 mEq/L (ref 135–145)

## 2021-07-07 LAB — VITAMIN B12: Vitamin B-12: 432 pg/mL (ref 211–911)

## 2021-07-07 LAB — FOLATE: Folate: 8.3 ng/mL (ref >5.9)

## 2021-07-07 LAB — HEMOGLOBIN A1C: Hgb A1c MFr Bld: 8.1 % — ABNORMAL HIGH (ref 4.6–6.5)

## 2021-07-07 NOTE — Progress Notes (Signed)
? ?Subjective:  ?Patient ID: Kristen [name], female    DOB: 02/29/1946  Age: 76 y.o. MRN: [id number] ? ?CC: Diabetes ? ? ?HPI ?Kristen [name] presents for f/up - ? ?She has stopped taking quetiapine and vilazodone because she thinks they cause dizziness, lightheadedness, and fatigue.  She continues to complain of bugs and is picking at her skin today.  She tells me she has an appointment soon with psychiatry.  She said she hates the apartment she lives in and wants to move somewhere else.  She has chronic, unchanged shortness of breath. ? ?Outpatient Medications Prior to Visit  ?Medication Sig Dispense Refill  ? ALPRAZolam (XANAX) 1 MG tablet 1  qam  half  midday  half qhs 180 tablet 0  ? Blood Glucose Monitoring Suppl (ONE TOUCH ULTRA 2) w/Device KIT Inject 1 Act into the skin 3 (three) times daily. Use TID 1 kit 2  ? Cholecalciferol (VITAMIN D3) 50 MCG (2000 UT) capsule Take 2,000 Units by mouth daily.    ? Continuous Blood Gluc Receiver (FREESTYLE LIBRE 2 READER) DEVI 1 Act by Does not apply route daily. 2 each 5  ? Continuous Blood Gluc Sensor (FREESTYLE LIBRE 2 SENSOR) MISC 1 Act by Does not apply route daily. 2 each 5  ? furosemide (LASIX) 20 MG tablet TAKE ONE TABLET BY MOUTH EVERY MORNING 90 tablet 1  ? gabapentin (NEURONTIN) 300 MG capsule TAKE ONE CAPSULE BY MOUTH three times daily 270 capsule 1  ? glucose blood (COOL BLOOD GLUCOSE TEST STRIPS) test strip 1 each by Other route 3 (three) times daily. Use to test blood sugar TID. DX: E11.9 300 each 1  ? JARDIANCE 25 MG TABS tablet Take 1 tablet (25 mg total) by mouth daily. Via [employer] [facility] pt assistance 90 tablet 1  ? Lancets (ONETOUCH ULTRASOFT) lancets Use as instructed 100 each 12  ? magnesium oxide (MAG-OX) 400 (240 Mg) MG tablet Take 1 tablet (400 mg total) by mouth 2 (two) times daily. Take in the morning and evening. 180 tablet 3  ? metFORMIN (GLUCOPHAGE-XR) 500 MG 24 hr tablet Take 3 tablets (1,500 mg total) by mouth daily with breakfast.  270 tablet 1  ? metoprolol succinate (TOPROL-XL) 100 MG 24 hr tablet TAKE ONE TABLET BY MOUTH TWICE DAILY WITH OR immediately following A meal 180 tablet 1  ? nitroGLYCERIN (NITROSTAT) 0.4 MG SL tablet Place 1 tablet (0.4 mg total) under the tongue every 5 (five) minutes as needed for chest pain (x 3 doses). Reported on 05/08/2015 35 tablet 2  ? NOVOFINE PEN NEEDLE 32G X 6 MM MISC USE with insulin AS DIRECTED 100 each 3  ? potassium chloride SA (KLOR-CON) 20 MEQ tablet TAKE ONE TABLET BY MOUTH TWICE DAILY 180 tablet 1  ? sacubitril-valsartan (ENTRESTO) 24-26 MG Take 1 tablet by mouth 2 (two) times daily. 180 tablet 3  ? Semaglutide, 2 MG/DOSE, (OZEMPIC, 2 MG/DOSE,) 8 MG/3ML SOPN Inject 2 mg into the skin once a week. 9 mL 1  ? spironolactone (ALDACTONE) 25 MG tablet TAKE ONE TABLET BY MOUTH ONCE DAILY 90 tablet 1  ? thiamine 100 MG tablet Take 1 tablet (100 mg total) by mouth daily. 90 tablet 1  ? warfarin (COUMADIN) 3 MG tablet TAKE 1 TABLET DAILY BY MOUTH EXCEPT TAKE  1 AND 1/2 TABLETS ON MONDAYS OR AS DIRECTED BY ANTICOAGULATION CLINIC 100 tablet 1  ? Ascorbic Acid (VITAMIN C) 1000 MG tablet Take 1,000 mg by mouth daily.    ?  atorvastatin (LIPITOR) 40 MG tablet TAKE ONE TABLET BY MOUTH DAILY 90 tablet 1  ? cephALEXin (KEFLEX) 500 MG capsule Take 1 capsule (500 mg total) by mouth 4 (four) times daily. 20 capsule 0  ? cyclobenzaprine (FLEXERIL) 10 MG tablet Take 10 mg by mouth 3 (three) times daily as needed for muscle spasms.    ? meclizine (ANTIVERT) 25 MG tablet Take 1 tablet (25 mg total) by mouth 3 (three) times daily as needed for dizziness. 15 tablet 0  ? PERCOCET 10-325 MG tablet Take 1 tablet by mouth 5 (five) times daily as needed.    ? QUEtiapine (SEROQUEL) 50 MG tablet Take 1 tablet (50 mg total) by mouth at bedtime. 90 tablet 0  ? TOUJEO MAX SOLOSTAR 300 UNIT/ML Solostar Pen INJECT 50 UNITS SUBCUTANEOUSLY DAILY 15 mL 5  ? Vilazodone HCl 20 MG TABS Take 1 tablet (20 mg total) by mouth daily. 90 tablet 0   ? ?No facility-administered medications prior to visit.  ? ? ?ROS ?Review of Systems  ?Constitutional:  Positive for fatigue. Negative for appetite change, chills, diaphoresis, fever and unexpected weight change.  ?HENT: Negative.    ?Respiratory:  Positive for shortness of breath. Negative for cough, chest tightness and wheezing.   ?Cardiovascular:  Negative for chest pain, palpitations and leg swelling.  ?Gastrointestinal:  Negative for abdominal pain, constipation, diarrhea, nausea and vomiting.  ?Genitourinary:  Negative for difficulty urinating.  ?Musculoskeletal: Negative.  Negative for myalgias.  ?Skin: Negative.   ?Neurological:  Positive for dizziness, weakness and light-headedness. Negative for syncope, speech difficulty and headaches.  ?     ++ vertigo  ?Hematological:  Negative for adenopathy. Does not bruise/bleed easily.  ?Psychiatric/Behavioral:  Positive for behavioral problems, confusion, dysphoric mood and sleep disturbance. Negative for agitation, decreased concentration, hallucinations and suicidal ideas. The patient is not nervous/anxious.   ? ?Objective:  ?BP 122/78 (BP Location: Left Arm, Patient Position: Sitting, Cuff Size: Large)   Pulse 88   Temp 97.8 ?F (36.6 ?C) (Oral)   Resp 16   Ht _0  (1.549 m)   SpO2 95%   BMI 33.82 kg/m?  ? ?BP Readings from Last 3 Encounters:  ?07/07/21 122/78  ?07/05/21 123/74  ?06/27/21 124/82  ? ? ?Wt Readings from Last 3 Encounters:  ?06/15/21 179 lb (81.2 kg)  ?04/30/21 186 lb 9.6 oz (84.6 kg)  ?03/17/21 191 lb (86.6 kg)  ? ? ?Physical Exam ?Vitals reviewed.  ?Constitutional:   ?   General: She is not in acute distress. ?   Appearance: She is ill-appearing (in a wheelchair). She is not toxic-appearing or diaphoretic.  ?HENT:  ?   Nose: Nose normal.  ?   Mouth/Throat:  ?   Mouth: Mucous membranes are moist.  ?Eyes:  ?   General: No scleral icterus. ?   Conjunctiva/sclera: Conjunctivae normal.  ?Cardiovascular:  ?   Rate and Rhythm: Normal rate and  regular rhythm.  ?   Heart sounds: No murmur heard. ?Pulmonary:  ?   Effort: Pulmonary effort is normal.  ?   Breath sounds: No stridor. No wheezing, rhonchi or rales.  ?Abdominal:  ?   General: Abdomen is flat.  ?Musculoskeletal:     ?   General: Normal range of motion.  ?   Cervical back: Neck supple.  ?   Right lower leg: No edema.  ?   Left lower leg: No edema.  ?Lymphadenopathy:  ?   Cervical: No cervical adenopathy.  ?Skin: ?   General:  Skin is warm and dry.  ?Neurological:  ?   Mental Status: She is alert.  ?   Cranial Nerves: No cranial nerve deficit.  ?   Sensory: No sensory deficit.  ?   Motor: Weakness present.  ?   Coordination: Coordination abnormal.  ?   Gait: Gait abnormal.  ?   Deep Tendon Reflexes: Reflexes normal.  ?Psychiatric:     ?   Attention and Perception: She is inattentive. She perceives visual hallucinations.     ?   Mood and Affect: Mood is anxious and depressed. Affect is not angry.     ?   Speech: She is communicative. Speech is delayed and tangential. Speech is not rapid and pressured.     ?   Behavior: Behavior is slowed and withdrawn. Behavior is not agitated, aggressive or hyperactive.     ?   Thought Content: Thought content is paranoid and delusional. Thought content does not include homicidal or suicidal ideation.     ?   Cognition and Memory: Cognition is impaired. Memory is impaired.     ?   Judgment: Judgment normal. Judgment is not impulsive.  ? ? ?Lab Results  ?Component Value Date  ? WBC 4.4 07/07/2021  ? HGB 12.8 07/07/2021  ? HCT 39.9 07/07/2021  ? PLT 375.0 07/07/2021  ? GLUCOSE 191 (H) 07/07/2021  ? CHOL 98 09/21/2020  ? TRIG 126.0 09/21/2020  ? HDL 34.60 (L) 09/21/2020  ? LDLDIRECT 116.0 11/28/2008  ? LDLCALC 39 09/21/2020  ? ALT 14 09/21/2020  ? AST 18 09/21/2020  ? NA 136 07/07/2021  ? K 3.7 07/07/2021  ? CL 102 07/07/2021  ? CREATININE 0.91 07/07/2021  ? BUN 22 07/07/2021  ? CO2 22 07/07/2021  ? TSH 3.99 03/17/2021  ? INR 2.2 07/06/2021  ? HGBA1C 8.1 (H) 07/07/2021   ? MICROALBUR <0.7 09/21/2020  ? ? ?DG Chest 1 View ? ?Result Date: 07/05/2021 ?CLINICAL DATA:  Chest pain. EXAM: CHEST  1 VIEW COMPARISON:  02/05/2016 FINDINGS: The cardiac silhouette, mediastinal and hilar contour

## 2021-07-07 NOTE — Patient Instructions (Signed)
Psychosis Psychosis, also called thought disturbance, refers to a severe loss of contact with reality. People having a psychotic episode (acute episode of psychosis) are not able to think clearly, and their emotions and responses do not match with what is actually happening. They may be very upset, have chaotic behavior, or be very quiet and withdrawn. People having a psychotic episode may have false beliefs about what is happening or who they are (delusions). They may see, hear, taste, smell, or feel things that are not present (hallucinations). What are the causes? This condition may be caused by: Very serious mental health or psychiatric conditions, such as schizophrenia, bipolar disorder, or major depression. Use of drugs such as hallucinogens or alcohol. Medical conditions such as delirium or neurological disorders. What are the signs or symptoms? Symptoms of this condition include: Delusions, such as: Feeling a lot of fear or suspicion (paranoia). Believing something that is odd, unrealistic, or false, such as believing that you are someone else. Hallucinations, such as: Hearing or seeing things, smelling odors, experiencing tastes, or feeling bodily sensations that do not exist. Command hallucinations that direct you to do something that could be dangerous. Disorganized thinking, such as thoughts that jump from one idea to another in a way that does not make sense. Disorganized speech, such as saying things that do not make sense, echoing others, or using words based on their sounds rather than their meanings. Inappropriate behavior, such as talking to yourself, showing a clear increase or decrease in activity, or intruding on unfamiliar people. How is this diagnosed? This condition is diagnosed based on an assessment by a health care provider. The health care provider may ask questions about: Your thoughts, feelings, and behavior. Any medical conditions you have. Any use of alcohol or  drugs. One or more of the following may also be done: A physical exam. Blood tests. Brain imaging, such as a CT scan or MRI. A brain wave study (electroencephalogram, or EEG). The health care provider may refer you to a mental health professional for further tests. How is this treated? Treatment for this condition may depend on the cause of the psychosis. Treatment may include one or more of the following: Supportive care and monitoring in the emergency room or hospital. You may need to stay in the hospital if you are a danger to yourself or others. Taking antipsychotic medicines to reduce symptoms and to balance chemicals in the brain. Treating an underlying medical condition. Stopping or reducing drugs that are causing psychotic episodes. Therapy and other supportive programs, such as: Ongoing treatment and care from a mental health professional. Individual or family therapy. Training to learn new skills to cope with the psychosis and prevent further episodes. Follow these instructions at home: Take over-the-counter and prescription medicines only as told by your health care provider. Consult a health care provider before taking over-the-counter medicines, herbs, or supplements. Surround yourself with people who care about you and can help manage your condition. Keep stress under control. Stress may trigger psychosis and make symptoms worse. Maintain a healthy lifestyle. This includes: Eating a healthy diet. Getting enough sleep. Exercising regularly. Avoiding alcohol, nicotine, and recreational drugs. Keep all follow-up visits. This is important. Contact a health care provider if: Medicines do not seem to be helping. You or others notice that: You continue to see, smell, or feel things that are not there. You hear voices telling you to do things. You feel extremely fearful and suspicious that someone or something will harm you. You   feel unable to leave your house. You have  trouble taking care of yourself. You have side effects of medicines, such as: Changes in sleep patterns. Dizziness. Weight gain. Restlessness. Movement changes. Shaking that you cannot control (tremors). Get help right away if: You have serious side effects of medicine, such as: Swelling of the face, lips, tongue, or throat. Fever, confusion, muscle spasms, or seizures. You have serious thoughts about harming yourself or hurting others. Get help right away if you feel like you may hurt yourself or others, or have thoughts about taking your own life. Go to your nearest emergency room or: Call 911. Call the National Suicide Prevention Lifeline at 1-800-273-8255 or 988. This is open 24 hours a day. Text the Crisis Text Line at 741741. These symptoms may be an emergency. Get help right away. Call 911. Do not wait to see if the symptoms will go away. Do not drive yourself to the hospital. Summary Psychosis refers to a severe loss of contact with reality. People having a psychotic episode are not able to think clearly, and they may have delusions or hallucinations. The health care provider may refer you to a mental health professional for further tests. Treatment may include therapy and other supportive programs such as individual or family therapy. Contact a health care provider if medicines do not seem to be helping. This information is not intended to replace advice given to you by your health care provider. Make sure you discuss any questions you have with your health care provider. Document Revised: 01/17/2021 Document Reviewed: 01/17/2021 Elsevier Patient Education  2023 Else[employer] Inc.  

## 2021-07-07 NOTE — Progress Notes (Signed)
? ? ?  Chronic Care Management ?Pharmacy Assistant  ? ?Name: Kristen [name]  MRN: [id number] DOB: [birth year] ? ? ?Contacted [employer]  to follow up on patient assistance application for [employer]. Per representative at [employer] states patient has been approved starting 06/29/21. Patient needs to call [employer] to set up time and date for shipment. ? ? ?Contacted [employer] [facility]  to follow up on patient assistance application for [employer]. Per representative at [facility] cares states patient has been approved starting 07/01/21 through 03/06/22. Shipment was sent out 07/01/21 to patient address and should receive on 07/08/21.    ? ?Left message for patient to call [employer] to set up shipment date. ? ?[name] ?Clinical Pharmacist Assistant ?[phone]   ?

## 2021-07-08 ENCOUNTER — Telehealth: Payer: Self-pay | Admitting: Internal Medicine

## 2021-07-08 ENCOUNTER — Encounter: Payer: Self-pay | Admitting: Internal Medicine

## 2021-07-08 MED ORDER — MECLIZINE HCL 25 MG PO TABS: 25.0000 mg | ORAL_TABLET | Freq: Three times a day (TID) | ORAL | 0 refills | Status: DC | PRN

## 2021-07-08 NOTE — Telephone Encounter (Signed)
Sherren Mocha, MD  You; Bernita Raisin, RN 2 hours ago (10:59 AM)  ? ?Reviewed ER data and looks reassuring from cardiac perspective. Troponins, EKG, and CXR all within expected limits. thx  ?  ? ?Called and spoke with patient to let her know Dr Antionette Char above comments. Pt states that she appreciates call. She states she knows that she needs to eat and drink more than she is currently, and she will work on that. Also says that someone is bringing some Meclizine to her today to help with her Vertigo symptoms. Encouraged pt to call us if she needs [place]. ?

## 2021-07-08 NOTE — Telephone Encounter (Signed)
Reviewed ER data and looks reassuring from cardiac perspective. Troponins, EKG, and CXR all within expected limits. thx ?

## 2021-07-08 NOTE — Telephone Encounter (Signed)
Pt reports vertigo.  ? ?1.Medication Requested: ?meclizine (ANTIVERT) 25 MG tablet ?2. Pharmacy (Name, [place]): ?[id number] - [address], [address] [place] Phone:  [phone]  ?Fax:  [phone]  ?  ? ?3. On Med List: yes ? ?4. Last Visit with PCP: ? ?5. Next visit date with PCP: ? ? ?Agent: Please be advised that RX refills may take up to 3 business days. We ask that you follow-up with your pharmacy.  ?

## 2021-07-09 ENCOUNTER — Telehealth: Payer: Self-pay | Admitting: Internal Medicine

## 2021-07-09 MED ORDER — TOUJEO MAX SOLOSTAR 300 UNIT/ML ~~LOC~~ SOPN: 30.0000 [IU] | PEN_INJECTOR | Freq: Every day | SUBCUTANEOUS | 1 refills | Status: DC

## 2021-07-09 MED ORDER — ATORVASTATIN CALCIUM 40 MG PO TABS
40.0000 mg | ORAL_TABLET | Freq: Every day | ORAL | 1 refills | Status: DC
Start: 2021-07-09 — End: 2022-02-06

## 2021-07-09 NOTE — Telephone Encounter (Signed)
Patient needs to discuss medication interactions ?

## 2021-07-09 NOTE — Telephone Encounter (Signed)
Patient needs to talk about medication interactions ?

## 2021-07-09 NOTE — Telephone Encounter (Signed)
Spoke with patient  ? ?Reports that she has been dealing with vertigo since last month had a question about meclizine she was using / if spironolactone could be causing her vertigo ? ?Reviewed with patient that spironolactone could cause dizziness if it was causing her to be dehydrated - reviewed most recent labs, appears last BMP WNL aside from glucose, do not believe spironolactone is contributing to her dizziness, advised for her to continue all medications at this time  ? ?Patient to call back with any issues or concerns  ? ?Tomasa Blase, PharmD ?Clinical Pharmacist, [place]  ? ?

## 2021-07-12 ENCOUNTER — Ambulatory Visit: Payer: Medicare Other | Admitting: Internal Medicine

## 2021-07-13 ENCOUNTER — Other Ambulatory Visit: Payer: Self-pay

## 2021-07-13 ENCOUNTER — Other Ambulatory Visit: Payer: Self-pay | Admitting: Internal Medicine

## 2021-07-13 ENCOUNTER — Ambulatory Visit (INDEPENDENT_AMBULATORY_CARE_PROVIDER_SITE_OTHER): Payer: Medicare Other | Admitting: *Deleted

## 2021-07-13 ENCOUNTER — Telehealth: Payer: Self-pay

## 2021-07-13 DIAGNOSIS — I2510 Atherosclerotic heart disease of native coronary artery without angina pectoris: Secondary | ICD-10-CM

## 2021-07-13 DIAGNOSIS — E519 Thiamine deficiency, unspecified: Secondary | ICD-10-CM

## 2021-07-13 DIAGNOSIS — E1149 Type 2 diabetes mellitus with other diabetic neurological complication: Secondary | ICD-10-CM

## 2021-07-13 DIAGNOSIS — I25119 Atherosclerotic heart disease of native coronary artery with unspecified angina pectoris: Secondary | ICD-10-CM

## 2021-07-13 DIAGNOSIS — I5042 Chronic combined systolic (congestive) and diastolic (congestive) heart failure: Secondary | ICD-10-CM

## 2021-07-13 LAB — URINE DRUGS OF ABUSE SCREEN W ALC, ROUTINE (REF LAB)
Amphetamines, Urine: NEGATIVE ng/mL
Barbiturate Quant, Ur: NEGATIVE ng/mL
Cannabinoid Quant, Ur: NEGATIVE ng/mL
Cocaine (Metab.): NEGATIVE ng/mL
Ethanol, Urine: NEGATIVE %
Methadone Screen, Urine: NEGATIVE ng/mL
Opiate Quant, Ur: NEGATIVE ng/mL
PCP Quant, Ur: NEGATIVE ng/mL
Propoxyphene: NEGATIVE ng/mL

## 2021-07-13 LAB — VITAMIN B1: Vitamin B1 (Thiamine): <6 nmol/L — ABNORMAL LOW (ref 8–30)

## 2021-07-13 LAB — DRUG PROFILE 799031: BENZODIAZEPINES: NEGATIVE

## 2021-07-13 MED ORDER — ENTRESTO 24-26 MG PO TABS: 1.0000 | ORAL_TABLET | Freq: Two times a day (BID) | ORAL | 3 refills | Status: DC

## 2021-07-13 MED ORDER — THIAMINE HCL 100 MG PO TABS
100.0000 mg | ORAL_TABLET | Freq: Every day | ORAL | 1 refills | Status: DC
Start: 2021-07-13 — End: 2021-12-25

## 2021-07-13 NOTE — Patient Instructions (Signed)
Visit Information ? ?Kristen [name], thank you for taking time to talk with me today. Please don't hesitate to contact me if I can be of assistance to you before our next scheduled telephone appointment ? ?Below are the goals we discussed today:  ?Patient Self-Care Activities: ?Patient Kristen [name] will: ?Take medications as prescribed ?Attend all scheduled provider appointments ?Call pharmacy for medication refills ?Call provider office for new concerns or questions ?Continue to check your blood sugar twice daily and write down these values from home monitoring: check first thing in the morning before you eat and again 2 hours after a large meal ?The goal for your fasting blood sugar (first thing in the morning, before eating) is: between 100-120 ?The goal for your 2-hours after-eating blood sugar is: between 160-180 ?Frequently review the information you have been sent about choices of food that are good to eat when you have diabetes, continue your efforts to begin making small changes to your soft diet to decrease your sugar and carbohydrate intake at home-- it is important that you eat the right foods regularly for optimal blood sugar control ?Try to follow a heart healthy, low salt, low carbohydrate and low sugar diet ?Continue to weigh daily and track weight on paper ?You reported today general weight ranges between 175-180 lbs ?Call cardiologist/ care providers if I gain more than 3 pounds overnight or 5 pounds in one week ?Watch for swelling in feet, ankles and legs every day  ?Stay in touch with the Clinical Social Worker and the Pharmacist at Dr. Ronnald Ramp' office  ?Keep up the great work preventing falls; continue to use your cane as needed ? ?Our next scheduled telephone follow up visit/ appointment is scheduled on: Wednesday, October 13, 2021 at 3:00 pm- This is a PHONE CALL appointment ? ?If you need to cancel or re-schedule our visit, please call [phone] and our care guide team will be happy to assist you. ?  ?I look  forward to hearing about your progress. ?  ?[name], RN, BSN, CCRN Alumnus ?[place] ?[phone]: direct office ? ?If you are experiencing a Mental Health or [place] or need someone to talk to, please  ?call the Suicide and Crisis Lifeline: 988 ?call the [place] National Suicide Prevention Lifeline: 9318061337 or TTY: 720-820-9751 TTY 754-860-7356) to talk to a trained counselor ?call 1-800-273-TALK (toll free, 24 hour hotline) ?go to [facility] [address], [place] [phone]) ?call 911  ? ?Patient verbalizes understanding of instructions and care plan provided today and agrees to view in [place]. Active MyChart status confirmed with patient ? ?Preventing Diabetes Mellitus Complications ?You can help to prevent or slow down problems that are caused by diabetes (diabetes mellitus). Following your diabetes plan and taking care of yourself can reduce your risk of serious or life-threatening complications. ?What actions can I take to prevent diabetes complications? ?Diabetes management ? ?Follow instructions from your health care providers about managing your diabetes. Your diabetes may be managed by a team of health care providers who can teach you how to care for yourself and can answer questions that you have. ?Educate yourself about your condition so you can make healthy choices about eating and physical activity. ?Know your target range for your blood sugar (glucose), and check your blood glucose level as often as told. Your health care provider will help you decide how often to check your blood glucose level depending on your treatment goals and  how well you are meeting them. ?Ask your health care provider if you should take low-dose aspirin daily and what dose is recommended for you. Taking low-dose aspirin daily is recommended to help prevent cardiovascular disease. ?Controlling your  blood pressure and cholesterol ?Your personal target blood pressure is determined based on: ?Your age. ?Your medicines. ?How long you have had diabetes. ?Any other medical conditions you have. ?To control your blood pressure: ?Follow instructions from your health care provider about meal planning, exercise, and medicines. ?Make sure your health care provider checks your blood pressure at every medical visit. ?Monitor your blood pressure at home as told by your health care provider. ?To control your cholesterol: ?Follow instructions from your health care provider about meal planning, exercise, and medicines. ?Have your cholesterol checked at least once a year. ?You may be prescribed medicine to lower cholesterol (statin). If you are not taking a statin, ask your health care provider if you should be. ?Controlling your cholesterol may: ?Help prevent heart disease and stroke. These are the most common health problems for people with diabetes. ?Improve your blood flow. ? ?Medical appointments and vaccines ?Schedule and keep yearly physical exams and eye exams. Your health care provider will tell you how often you need medical visits depending on your diabetes management plan. Keep all follow-up visits as told. This is important so possible problems can be identified early and complications can be avoided or treated. ?Every visit with your health care provider should include measuring your: ?Weight. ?Blood pressure. ?Blood glucose control. ?Your A1C (hemoglobin A1C) level should be checked: ?At least 2 times a year, if you are meeting your treatment goals. ?4 times a year, if you are not meeting treatment goals or if your treatment goals have changed. ?Your blood lipids (lipid profile) should be checked yearly. You should also be checked yearly for protein in your urine (urine microalbumin). ?If you have type 1 diabetes, get an eye exam 3-5 years after you are diagnosed, and then once a year after your first exam. ?If  you have type 2 diabetes, get an eye exam as soon as you are diagnosed, and then once a year after your first exam. ?It is also important to keep your vaccines current. It is recommended that you receive: ?A flu (influenza) vaccine every year. ?A pneumonia (pneumococcal) vaccine and a hepatitis B vaccine. If you are age 3 or older, you may get the pneumonia vaccine as a series of two separate shots. ?Ask your health care provider which other vaccines may be recommended. ?Lifestyle ?Do not use any products that contain nicotine or tobacco, such as cigarettes, e-cigarettes, and chewing tobacco. If you need help quitting, ask your health care provider. By avoiding nicotine and tobacco: ?You will lower your risk for heart attack, stroke, nerve disease, and kidney disease. ?Your cholesterol and blood pressure may improve. ?Your blood circulation will improve. ?If you drink alcohol: ?Limit how much you use to: ?0-1 drink a day for women who are not pregnant. ?0-2 drinks a day for men. ?Be aware of how much alcohol is in your drink. In the [place] one drink equals one 12 oz bottle of beer (355 mL), one 5 oz glass of wine (148 mL), or one 11?2 oz glass of hard liquor (44 mL). ?Taking care of your feet ?Diabetes may cause you to have poor blood circulation to your legs and feet. Because of this, taking care of your feet is very important. Diabetes can cause: ?The skin on the feet  to get thinner, break more easily, and heal more slowly. ?Nerve damage in your legs and feet, which results in decreased feeling. You may not notice minor injuries that could lead to serious problems. ?To avoid foot problems: ?Check your skin and feet every day for cuts, bruises, redness, blisters, or sores. ?Schedule a foot exam with your health care provider once every year. This exam includes: ?Inspecting the structure and skin of your feet. ?Checking the pulses and sensation in your feet. ?Make sure that your health care provider performs a  visual foot exam at every medical visit. ? ?Taking care of your teeth ?People with poorly controlled diabetes are more likely to have gum (periodontal) disease. Diabetes can make periodontal diseases harder to contr

## 2021-07-13 NOTE — Chronic Care Management (AMB) (Signed)
?Chronic Care Management  ? ?CCM RN Visit Note ? ?07/13/2021 ?Name: Kristen [name] MRN: [id number] DOB: [birth year] ? ?Subjective: ?Kristen [name] is a 76 y.o. year old female who is a primary care patient of [name], MD. The care management team was consulted for assistance with disease management and care coordination needs.   ? ?Engaged with patient by telephone for follow up visit in response to provider referral for case management and/or care coordination services.  ? ?Consent to Services:  ?The patient was given information about Chronic Care Management services, agreed to services, and gave verbal consent prior to initiation of services.  Please see initial visit note for detailed documentation.  ?Patient agreed to services and verbal consent obtained.  ? ?Assessment: Review of patient past medical history, allergies, medications, health status, including review of consultants reports, laboratory and other test data, was performed as part of comprehensive evaluation and provision of chronic care management services.  ?CCM Care Plan ? ?Allergies  ?Allergen Reactions  ? Metformin And Related Other (See Comments)  ?  Must take XR form only, cannot tolerate Regular release metformin  ? Cleocin [Clindamycin Hcl] Diarrhea  ? Codeine Itching  ? Doxycycline Hyclate Diarrhea  ? Macrolides And Ketolides Diarrhea  ? Morphine Hives  ? Pentazocine Lactate Itching and Nausea And Vomiting  ?  (GENERIC- [place])  ? Vibramycin [Doxycycline Calcium] Diarrhea and Rash  ? Clindamycin Diarrhea  ? Definity [Perflutren Lipid Microsphere] Other (See Comments)  ?  Patient complained of warm sensation in chest x few seconds duration when injected Definity.No other symptoms  ? Doxycycline Diarrhea  ? Metformin Other (See Comments)  ? Pentazocine Nausea Only  ? Perflutren Other (See Comments)  ? Perflutren Lipid Microspheres Other (See Comments)  ? Sulfa Antibiotics Diarrhea and Rash  ? Sulfonamide Derivatives Rash   ? ?Outpatient Encounter Medications as of 07/13/2021  ?Medication Sig  ? ALPRAZolam (XANAX) 1 MG tablet 1  qam  half  midday  half qhs  ? atorvastatin (LIPITOR) 40 MG tablet Take 1 tablet (40 mg total) by mouth daily.  ? Blood Glucose Monitoring Suppl (ONE TOUCH ULTRA 2) w/Device KIT Inject 1 Act into the skin 3 (three) times daily. Use TID  ? Cholecalciferol (VITAMIN D3) 50 MCG (2000 UT) capsule Take 2,000 Units by mouth daily.  ? Continuous Blood Gluc Receiver (FREESTYLE LIBRE 2 READER) DEVI 1 Act by Does not apply route daily.  ? Continuous Blood Gluc Sensor (FREESTYLE LIBRE 2 SENSOR) MISC 1 Act by Does not apply route daily.  ? furosemide (LASIX) 20 MG tablet TAKE ONE TABLET BY MOUTH EVERY MORNING  ? gabapentin (NEURONTIN) 300 MG capsule TAKE ONE CAPSULE BY MOUTH three times daily  ? glucose blood (COOL BLOOD GLUCOSE TEST STRIPS) test strip 1 each by Other route 3 (three) times daily. Use to test blood sugar TID. DX: E11.9  ? insulin glargine, 2 Unit Dial, (TOUJEO MAX SOLOSTAR) 300 UNIT/ML Solostar Pen Inject 30 Units into the skin daily.  ? JARDIANCE 25 MG TABS tablet Take 1 tablet (25 mg total) by mouth daily. Via BI CARES pt assistance  ? Lancets (ONETOUCH ULTRASOFT) lancets Use as instructed  ? magnesium oxide (MAG-OX) 400 (240 Mg) MG tablet Take 1 tablet (400 mg total) by mouth 2 (two) times daily. Take in the morning and evening.  ? meclizine (ANTIVERT) 25 MG tablet Take 1 tablet (25 mg total) by mouth 3 (three) times daily as needed for dizziness.  ? metFORMIN (GLUCOPHAGE-XR)  500 MG 24 hr tablet Take 3 tablets (1,500 mg total) by mouth daily with breakfast.  ? metoprolol succinate (TOPROL-XL) 100 MG 24 hr tablet TAKE ONE TABLET BY MOUTH TWICE DAILY WITH OR immediately following A meal  ? nitroGLYCERIN (NITROSTAT) 0.4 MG SL tablet Place 1 tablet (0.4 mg total) under the tongue every 5 (five) minutes as needed for chest pain (x 3 doses). Reported on 05/08/2015  ? NOVOFINE PEN NEEDLE 32G X 6 MM MISC USE with  insulin AS DIRECTED  ? potassium chloride SA (KLOR-CON) 20 MEQ tablet TAKE ONE TABLET BY MOUTH TWICE DAILY  ? sacubitril-valsartan (ENTRESTO) 24-26 MG Take 1 tablet by mouth 2 (two) times daily.  ? Semaglutide, 2 MG/DOSE, (OZEMPIC, 2 MG/DOSE,) 8 MG/3ML SOPN Inject 2 mg into the skin once a week.  ? spironolactone (ALDACTONE) 25 MG tablet TAKE ONE TABLET BY MOUTH ONCE DAILY  ? warfarin (COUMADIN) 3 MG tablet TAKE 1 TABLET DAILY BY MOUTH EXCEPT TAKE  1 AND 1/2 TABLETS ON MONDAYS OR AS DIRECTED BY ANTICOAGULATION CLINIC  ? [DISCONTINUED] sacubitril-valsartan (ENTRESTO) 24-26 MG Take 1 tablet by mouth 2 (two) times daily.  ? [DISCONTINUED] thiamine 100 MG tablet Take 1 tablet (100 mg total) by mouth daily.  ? ?No facility-administered encounter medications on file as of 07/13/2021.  ? ?Patient Active Problem List  ? Diagnosis Date Noted  ? Weakness of extremity 07/07/2021  ? Delusions of parasitosis ([place]) 07/07/2021  ? Severe episode of recurrent major depressive disorder, with psychotic features ([place]) 06/15/2021  ? GAD (generalized anxiety disorder) 06/02/2021  ? Stage 3a chronic kidney disease ([place]) 03/17/2021  ? PAF (paroxysmal atrial fibrillation) ([place])   ? Thiamine deficiency 12/30/2018  ? Vitamin B12 deficiency anemia due to intrinsic factor deficiency 12/20/2018  ? CHF (congestive heart failure), NYHA class I, chronic, combined ([place]) 12/19/2018  ? Coronary artery disease involving native coronary artery of native heart with angina pectoris ([place]) 12/19/2018  ? Obesity with body mass index (BMI) of 30.0 to 39.9 01/15/2018  ? Chronic idiopathic constipation 12/27/2017  ? Type 2 diabetes mellitus ([place]) 10/30/2017  ? Eczema 08/07/2017  ? Insomnia secondary to chronic pain 08/04/2016  ? Visit for screening mammogram 04/14/2016  ? LV dysfunction   ? Routine general medical examination at a health care facility 05/03/2013  ? DM (diabetes mellitus), type 2 with peripheral vascular complications ([place]) 40/34/7425  ?  PVC's/nonsustained VT 10/24/2012  ? H/O: CVA (cerebrovascular accident) 02/09/2012  ? Hypothyroidism 05/27/2011  ? Fibromyalgia 10/08/2010  ? Long term current use of anticoagulant 05/05/2010  ? Low back pain, non-specific 05/12/2008  ? Cognitive dysfunction associated with depression 01/29/2007  ? Osteoarthritis 11/22/2006  ? Hyperlipidemia associated with type 2 diabetes mellitus ([place]) 09/13/2006  ? Coronary atherosclerosis 09/13/2006  ? GERD 09/13/2006  ? Hypertension associated with diabetes ([place]) 08/30/2006  ? ?Conditions to be addressed/monitored:  CHF and DMII ? ?Care Plan : RN Care Manager Plan of Care  ?Updates made by Knox Royalty, RN since 07/13/2021 12:00 AM  ?  ? ?Problem: Chronic Disease Management Needs   ?Priority: High  ?  ? ?Long-Range Goal: Ongoing adherence to established plan of care for long term chronic disease management   ?Start Date: 01/07/2021  ?Expected End Date: 01/07/2022  ?Recent Progress: On track  ?Priority: High  ?Note:   ?Current Barriers:  ?Chronic Disease Management support and education needs related to CHF and DMII ?Unable to independently verbalize appropriate choices for DM soft diet- requires ongoing reinforcement /  support ?Does not consistently adhere to provider recommendations re: following low carb/ sugar diet ?Ongoing dental issues- unable to afford dental care: community resource care guide referral placed for same ?06/01/21: second referral placed with  [place] [facility]- patient has recently moved and has misplaced previously provided list of resources ?07/13/21: verified by review of EHR that patient was contacted by [place] and was provided dental resources as requested ?Chronic pain: followed regularly by pain management provider ?Mental health history: currently active with clinical psychiatry provider ?07/13/21- patient tells me she is no longer active with psychiatric provider; has been working with CCM CSW on counseling  services- tells me she has the form for [facility] counseling services and will be placing in mail tomorrow: encouraged her ongoing engagement with CCM CSW for mental health needs ? ?RNCM Clinical Goal(s):  ?Patient will demo

## 2021-07-13 NOTE — Telephone Encounter (Signed)
**Note De-Identified Ashawnti Tangen Obfuscation** Letter received Agastya Meister fax from [employer] pt [facility] stating that they have approved the pt for Entresto asst until 03/06/2022. ?They also requested that a written and faxed or a e-scribed Entresto RX be sent to [facility] in [place] so they can ship the pts Entresto to her. ? ?I did e-scribed a Entresto 24-26 mg RX for #180 with 3 refills to [facility] by [facility] in [place] as requested with a note to pharmacist "For Patient ID: [id number]". ?

## 2021-07-13 NOTE — Telephone Encounter (Signed)
Received message patient had questions about her medication  ? ?Left message for patient to return call to further discuss  ? ?Tomasa Blase, PharmD ?Clinical Pharmacist, [place]  ? ?

## 2021-07-15 NOTE — Telephone Encounter (Signed)
Pt has questions about meclizine (ANTIVERT) 25 MG tablet. Should the medication make her more dizzy. I reached out to [facility] since that who she asked to speak with and I advised her of his response: ?no it should help with her dizziness ...it may be that is isnt effective for her? if she is continuing to have worse issues with vertigo she should follow up with her PCP. ? ?Pt replied ok and disconnected the call. ? ? ? ? ? ? ?

## 2021-07-21 ENCOUNTER — Ambulatory Visit (INDEPENDENT_AMBULATORY_CARE_PROVIDER_SITE_OTHER): Payer: Medicare Other | Admitting: Pharmacist

## 2021-07-21 ENCOUNTER — Emergency Department (HOSPITAL_COMMUNITY)
Admission: EM | Admit: 2021-07-21 | Discharge: 2021-07-22 | Disposition: A | Discharge status: Home / Self Care | Payer: Medicare Other | Source: Home / Self Care | Attending: Emergency Medicine | Admitting: Emergency Medicine

## 2021-07-21 ENCOUNTER — Encounter (HOSPITAL_COMMUNITY): Payer: Self-pay

## 2021-07-21 ENCOUNTER — Other Ambulatory Visit: Payer: Self-pay

## 2021-07-21 ENCOUNTER — Telehealth: Payer: Medicare Other

## 2021-07-21 ENCOUNTER — Encounter: Payer: Self-pay | Admitting: Pharmacist

## 2021-07-21 VITALS — BP 124/63 | HR 94

## 2021-07-21 DIAGNOSIS — Z794 Long term (current) use of insulin: Secondary | ICD-10-CM | POA: Diagnosis not present

## 2021-07-21 DIAGNOSIS — E782 Mixed hyperlipidemia: Secondary | ICD-10-CM | POA: Diagnosis not present

## 2021-07-21 DIAGNOSIS — E119 Type 2 diabetes mellitus without complications: Secondary | ICD-10-CM | POA: Insufficient documentation

## 2021-07-21 DIAGNOSIS — K626 Ulcer of anus and rectum: Secondary | ICD-10-CM | POA: Insufficient documentation

## 2021-07-21 DIAGNOSIS — E669 Obesity, unspecified: Secondary | ICD-10-CM | POA: Diagnosis not present

## 2021-07-21 DIAGNOSIS — Z79899 Other long term (current) drug therapy: Secondary | ICD-10-CM | POA: Diagnosis not present

## 2021-07-21 DIAGNOSIS — R770 Abnormality of albumin: Secondary | ICD-10-CM | POA: Diagnosis not present

## 2021-07-21 DIAGNOSIS — I5042 Chronic combined systolic (congestive) and diastolic (congestive) heart failure: Secondary | ICD-10-CM

## 2021-07-21 DIAGNOSIS — Z951 Presence of aortocoronary bypass graft: Secondary | ICD-10-CM | POA: Insufficient documentation

## 2021-07-21 DIAGNOSIS — I110 Hypertensive heart disease with heart failure: Secondary | ICD-10-CM | POA: Insufficient documentation

## 2021-07-21 DIAGNOSIS — R1030 Lower abdominal pain, unspecified: Secondary | ICD-10-CM | POA: Insufficient documentation

## 2021-07-21 DIAGNOSIS — K5641 Fecal impaction: Secondary | ICD-10-CM | POA: Insufficient documentation

## 2021-07-21 DIAGNOSIS — I2510 Atherosclerotic heart disease of native coronary artery without angina pectoris: Secondary | ICD-10-CM | POA: Insufficient documentation

## 2021-07-21 DIAGNOSIS — E1169 Type 2 diabetes mellitus with other specified complication: Secondary | ICD-10-CM | POA: Insufficient documentation

## 2021-07-21 DIAGNOSIS — K5900 Constipation, unspecified: Secondary | ICD-10-CM | POA: Diagnosis not present

## 2021-07-21 DIAGNOSIS — Z6839 Body mass index (BMI) 39.0-39.9, adult: Secondary | ICD-10-CM | POA: Diagnosis not present

## 2021-07-21 DIAGNOSIS — I152 Hypertension secondary to endocrine disorders: Secondary | ICD-10-CM

## 2021-07-21 DIAGNOSIS — Z8673 Personal history of transient ischemic attack (TIA), and cerebral infarction without residual deficits: Secondary | ICD-10-CM | POA: Diagnosis not present

## 2021-07-21 DIAGNOSIS — Z7984 Long term (current) use of oral hypoglycemic drugs: Secondary | ICD-10-CM | POA: Diagnosis not present

## 2021-07-21 DIAGNOSIS — E876 Hypokalemia: Secondary | ICD-10-CM | POA: Insufficient documentation

## 2021-07-21 DIAGNOSIS — I480 Paroxysmal atrial fibrillation: Secondary | ICD-10-CM | POA: Insufficient documentation

## 2021-07-21 DIAGNOSIS — E1159 Type 2 diabetes mellitus with other circulatory complications: Secondary | ICD-10-CM

## 2021-07-21 DIAGNOSIS — I10 Essential (primary) hypertension: Secondary | ICD-10-CM | POA: Insufficient documentation

## 2021-07-21 DIAGNOSIS — I25110 Atherosclerotic heart disease of native coronary artery with unstable angina pectoris: Secondary | ICD-10-CM | POA: Insufficient documentation

## 2021-07-21 DIAGNOSIS — M79671 Pain in right foot: Secondary | ICD-10-CM | POA: Insufficient documentation

## 2021-07-21 DIAGNOSIS — Z743 Need for continuous supervision: Secondary | ICD-10-CM | POA: Diagnosis not present

## 2021-07-21 DIAGNOSIS — Z87891 Personal history of nicotine dependence: Secondary | ICD-10-CM | POA: Insufficient documentation

## 2021-07-21 DIAGNOSIS — R309 Painful micturition, unspecified: Secondary | ICD-10-CM | POA: Insufficient documentation

## 2021-07-21 DIAGNOSIS — R109 Unspecified abdominal pain: Secondary | ICD-10-CM | POA: Diagnosis not present

## 2021-07-21 DIAGNOSIS — Z7901 Long term (current) use of anticoagulants: Secondary | ICD-10-CM | POA: Diagnosis not present

## 2021-07-21 DIAGNOSIS — I700 Atherosclerosis of aorta: Secondary | ICD-10-CM | POA: Diagnosis not present

## 2021-07-21 DIAGNOSIS — R17 Unspecified jaundice: Secondary | ICD-10-CM | POA: Diagnosis not present

## 2021-07-21 LAB — COMPREHENSIVE METABOLIC PANEL
ALT: 18 U/L (ref 0–44)
AST: 20 U/L (ref 15–41)
Albumin: 3.6 g/dL (ref 3.5–5.0)
Alkaline Phosphatase: 53 U/L (ref 38–126)
Anion gap: 8 (ref 5–15)
BUN: 13 mg/dL (ref 8–23)
CO2: 19 mmol/L — ABNORMAL LOW (ref 22–32)
Calcium: 9.8 mg/dL (ref 8.9–10.3)
Chloride: 111 mmol/L (ref 98–111)
Creatinine, Ser: 0.99 mg/dL (ref 0.44–1.00)
GFR, Estimated: >60 mL/min (ref >60)
Glucose, Bld: 188 mg/dL — ABNORMAL HIGH (ref 70–99)
Potassium: 3.2 mmol/L — ABNORMAL LOW (ref 3.5–5.1)
Sodium: 138 mmol/L (ref 135–145)
Total Bilirubin: 1 mg/dL (ref 0.3–1.2)
Total Protein: 7.2 g/dL (ref 6.5–8.1)

## 2021-07-21 LAB — CBC
HCT: 41.9 % (ref 36.0–46.0)
Hemoglobin: 13.2 g/dL (ref 12.0–15.0)
MCH: 27.6 pg (ref 26.0–34.0)
MCHC: 31.5 g/dL (ref 30.0–36.0)
MCV: 87.5 fL (ref 80.0–100.0)
Platelets: 374 10*3/uL (ref 150–400)
RBC: 4.79 MIL/uL (ref 3.87–5.11)
RDW: 17.2 % — ABNORMAL HIGH (ref 11.5–15.5)
WBC: 8.3 10*3/uL (ref 4.0–10.5)
nRBC: 0 % (ref 0.0–0.2)

## 2021-07-21 LAB — LIPASE, BLOOD: Lipase: 23 U/L (ref 11–51)

## 2021-07-21 LAB — PROTIME-INR
INR: 2.1 — ABNORMAL HIGH (ref 0.8–1.2)
Prothrombin Time: 23 seconds — ABNORMAL HIGH (ref 11.4–15.2)

## 2021-07-21 MED ORDER — ENTRESTO 49-51 MG PO TABS: 1.0000 | ORAL_TABLET | Freq: Two times a day (BID) | ORAL | 3 refills | Status: DC

## 2021-07-21 NOTE — Progress Notes (Signed)
Patient ID: Kristen [name]                 DOB: 1945/03/30                      MRN: [id number]     HPI: Kristen [name] is a 76 y.o. female referred by Dr. Burt Knack to pharmacy clinic for HF medication management. PMH is significant for coronary artery disease status post CABG in 2004.  Other medical problems include paroxysmal atrial fibrillation on warfarin, history of stroke, obesity, diabetes, hypertension, and left bundle branch block. Most recent LVEF showed worsening of EF to 25-30% on 05/13/21. Her telmisartan was stopped and she was started on Entresto 24/26mg  BID. She also wore a Zio patch to rule out worsening afib as the cause of her EF decline. Monitor showed no afib. Repeat BMP stable.  Today's visit was done over the phone. Patient at home with her husband. She tells me about her move and bed bugs. She states the "residual from the bed bugs seem to be going away." She states that she can't speak open to me with her husband around. These things have already been addressed by Dr. Ronnald Ramp.  When asked about SOB patient states she gets SOB with exertion. No SOB at rest. Denies dizziness/lightheadedness expect for the 2 weeks of vertigo issues. She states that she has had more energy since starting Entresto and wants to know if that's normal. States her appetite has been poor. Can complete ADL. Blood pressure typically 120-140/70-80 at home per pt. Has been in the 120's/70's at Dr. Ronnald Ramp office. Found to have thiamine deficiency. Being supplemented.  Current CHF meds: Entresto 24/26mg  twice a day, spironolactone 25mg  daily, Jardiance 25mg  daily, metoprolol succinate 100mg  twice a day, furosemide 20mg  daily Previously tried:  BP goal: <130/80  Family History: The patient's family history includes Anxiety disorder in her maternal aunt; Arthritis in an other family member; Colon cancer in her paternal uncle; Depression in her sister; Diabetes in an other family member; Heart attack in  her father; Heart disease in her father; Hypertension in an other family member; Kidney disease in an other family member; Stroke in her mother and another family member.  Social History: no tobacco or alcohol use  Diet: not addressed  Exercise: limited by back pain  Home BP readings: 120-140/70-80 HR upper 80's-90's  Wt Readings from Last 3 Encounters:  07/07/21 182 lb (82.6 kg)  06/15/21 179 lb (81.2 kg)  04/30/21 186 lb 9.6 oz (84.6 kg)   BP Readings from Last 3 Encounters:  07/07/21 122/78  07/05/21 123/74  06/27/21 124/82   Pulse Readings from Last 3 Encounters:  07/07/21 88  07/05/21 88  06/27/21 89    Renal function: CrCl cannot be calculated (Unknown ideal weight.).  Past Medical History:  Diagnosis Date   Arthritis    Blood transfusion without reported diagnosis    Bursitis    CAD (coronary artery disease)    a. s/p CABG 2004.b. stable cath 2014 demonstrating stable CAD and continued patency of her LIMA graft.   Chronic anticoagulation    on coumadin   DDD (degenerative disc disease), lumbar    Depression    Diabetes mellitus    Diastolic dysfunction    per echo in October 2012 with EF 50 to 55%   Fibromyalgia    GERD (gastroesophageal reflux disease) 10/23/2003   Headache(784.0)    Hyperlipidemia    Hypertension  Hypokalemia    LBBB (left bundle branch block)    Lumbar back pain    LV dysfunction    a. EF 45% by cath 2014. b. EF 50-55% by technically difficult echo in 08/2014.   Lymphadenitis    Morbid obesity ([place])    a. Sleep study negative for significant OSA in 11/2014.   PAF (paroxysmal atrial fibrillation) ([place])    Stroke [facility]) 2004   affected speech per pt    Current Outpatient Medications on File Prior to Visit  Medication Sig Dispense Refill   ALPRAZolam (XANAX) 1 MG tablet 1  qam  half  midday  half qhs 180 tablet 0   atorvastatin (LIPITOR) 40 MG tablet Take 1 tablet (40 mg total) by mouth daily. 90 tablet 1   Blood Glucose  Monitoring Suppl (ONE TOUCH ULTRA 2) w/Device KIT Inject 1 Act into the skin 3 (three) times daily. Use TID 1 kit 2   Cholecalciferol (VITAMIN D3) 50 MCG (2000 UT) capsule Take 2,000 Units by mouth daily.     Continuous Blood Gluc Receiver (FREESTYLE LIBRE 2 READER) DEVI 1 Act by Does not apply route daily. 2 each 5   Continuous Blood Gluc Sensor (FREESTYLE LIBRE 2 SENSOR) MISC 1 Act by Does not apply route daily. 2 each 5   furosemide (LASIX) 20 MG tablet TAKE ONE TABLET BY MOUTH EVERY MORNING 90 tablet 1   gabapentin (NEURONTIN) 300 MG capsule TAKE ONE CAPSULE BY MOUTH three times daily 270 capsule 1   glucose blood (COOL BLOOD GLUCOSE TEST STRIPS) test strip 1 each by Other route 3 (three) times daily. Use to test blood sugar TID. DX: E11.9 300 each 1   insulin glargine, 2 Unit Dial, (TOUJEO MAX SOLOSTAR) 300 UNIT/ML Solostar Pen Inject 30 Units into the skin daily. 9 mL 1   JARDIANCE 25 MG TABS tablet Take 1 tablet (25 mg total) by mouth daily. Via BI CARES pt assistance 90 tablet 1   Lancets (ONETOUCH ULTRASOFT) lancets Use as instructed 100 each 12   magnesium oxide (MAG-OX) 400 (240 Mg) MG tablet Take 1 tablet (400 mg total) by mouth 2 (two) times daily. Take in the morning and evening. 180 tablet 3   meclizine (ANTIVERT) 25 MG tablet Take 1 tablet (25 mg total) by mouth 3 (three) times daily as needed for dizziness. 270 tablet 0   metFORMIN (GLUCOPHAGE-XR) 500 MG 24 hr tablet Take 3 tablets (1,500 mg total) by mouth daily with breakfast. 270 tablet 1   metoprolol succinate (TOPROL-XL) 100 MG 24 hr tablet TAKE ONE TABLET BY MOUTH TWICE DAILY WITH OR immediately following A meal 180 tablet 1   nitroGLYCERIN (NITROSTAT) 0.4 MG SL tablet Place 1 tablet (0.4 mg total) under the tongue every 5 (five) minutes as needed for chest pain (x 3 doses). Reported on 05/08/2015 35 tablet 2   NOVOFINE PEN NEEDLE 32G X 6 MM MISC USE with insulin AS DIRECTED 100 each 3   potassium chloride SA (KLOR-CON) 20 MEQ  tablet TAKE ONE TABLET BY MOUTH TWICE DAILY 180 tablet 1   sacubitril-valsartan (ENTRESTO) 24-26 MG Take 1 tablet by mouth 2 (two) times daily. 180 tablet 3   Semaglutide, 2 MG/DOSE, (OZEMPIC, 2 MG/DOSE,) 8 MG/3ML SOPN Inject 2 mg into the skin once a week. 9 mL 1   spironolactone (ALDACTONE) 25 MG tablet TAKE ONE TABLET BY MOUTH ONCE DAILY 90 tablet 1   thiamine 100 MG tablet Take 1 tablet (100 mg total) by mouth  daily. 90 tablet 1   warfarin (COUMADIN) 3 MG tablet TAKE 1 TABLET DAILY BY MOUTH EXCEPT TAKE  1 AND 1/2 TABLETS ON MONDAYS OR AS DIRECTED BY ANTICOAGULATION CLINIC 100 tablet 1   No current facility-administered medications on file prior to visit.    Allergies  Allergen Reactions   Metformin And Related Other (See Comments)    Must take XR form only, cannot tolerate Regular release metformin   Cleocin [Clindamycin Hcl] Diarrhea   Codeine Itching   Doxycycline Hyclate Diarrhea   Macrolides And Ketolides Diarrhea   Morphine Hives   Pentazocine Lactate Itching and Nausea And Vomiting    (GENERIC- [place])   Vibramycin [Doxycycline Calcium] Diarrhea and Rash   Clindamycin Diarrhea   Definity [Perflutren Lipid Microsphere] Other (See Comments)    Patient complained of warm sensation in chest x few seconds duration when injected Definity.No other symptoms   Doxycycline Diarrhea   Metformin Other (See Comments)   Pentazocine Nausea Only   Perflutren Other (See Comments)   Perflutren Lipid Microspheres Other (See Comments)   Sulfa Antibiotics Diarrhea and Rash   Sulfonamide Derivatives Rash     Assessment/Plan:  1. CHF - Patient blood pressure is at goal of <130/80 but has room to titrate Entresto. Will increase Entresto to 49/51mg  twice a day. Patient aware to take 2 of the 24/26mg  tablets twice a day until she runs out. I have sent an rx to [facility] for the 49/51mg  tablets. She is aware to take only 1 of the 49/51mg  tablets twice a day once she runs out of 24/26mg .  Decrease furosemide to 10mg  daily. Continue spironolactone 25mg  daily, Jardiance 25mg  daily, metoprolol succinate 100mg  twice a day. Follow up 6/8 in clinic. Will need BMP.  Thank you,  Ramond Dial, Pharm.D, BCPS, CPP [facility]  [address] [place], [place] [address]  Phone: [phone]; Fax: [phone]

## 2021-07-21 NOTE — ED Triage Notes (Addendum)
PER EMS: pt reports lower abd pain and tenderness that started 5 days ago and is getting worse. Denies n/v. Also reports anal and vaginal pain and multiple loose stools.  ?BP-124/74, HR- 104, RR-18, 98% RA, CBG-212 ?
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
## 2021-07-21 NOTE — ED Notes (Signed)
Dr Bero at bedside.  

## 2021-07-22 ENCOUNTER — Encounter (HOSPITAL_COMMUNITY): Payer: Self-pay | Admitting: Internal Medicine

## 2021-07-22 ENCOUNTER — Other Ambulatory Visit: Payer: Self-pay

## 2021-07-22 ENCOUNTER — Emergency Department (HOSPITAL_COMMUNITY): Payer: Medicare Other

## 2021-07-22 ENCOUNTER — Telehealth: Payer: Self-pay | Admitting: Internal Medicine

## 2021-07-22 ENCOUNTER — Observation Stay (HOSPITAL_COMMUNITY)
Admission: EM | Admit: 2021-07-22 | Discharge: 2021-07-27 | Disposition: A | Discharge status: Home Health | Payer: Medicare Other | Attending: Internal Medicine | Admitting: Internal Medicine

## 2021-07-22 DIAGNOSIS — R7989 Other specified abnormal findings of blood chemistry: Secondary | ICD-10-CM

## 2021-07-22 DIAGNOSIS — K5900 Constipation, unspecified: Secondary | ICD-10-CM | POA: Diagnosis not present

## 2021-07-22 DIAGNOSIS — E1169 Type 2 diabetes mellitus with other specified complication: Secondary | ICD-10-CM

## 2021-07-22 DIAGNOSIS — I480 Paroxysmal atrial fibrillation: Secondary | ICD-10-CM | POA: Diagnosis not present

## 2021-07-22 DIAGNOSIS — Z79899 Other long term (current) drug therapy: Secondary | ICD-10-CM | POA: Diagnosis not present

## 2021-07-22 DIAGNOSIS — Z7901 Long term (current) use of anticoagulants: Secondary | ICD-10-CM | POA: Diagnosis not present

## 2021-07-22 DIAGNOSIS — E782 Mixed hyperlipidemia: Secondary | ICD-10-CM | POA: Diagnosis not present

## 2021-07-22 DIAGNOSIS — K219 Gastro-esophageal reflux disease without esophagitis: Secondary | ICD-10-CM

## 2021-07-22 DIAGNOSIS — I5042 Chronic combined systolic (congestive) and diastolic (congestive) heart failure: Secondary | ICD-10-CM

## 2021-07-22 DIAGNOSIS — I110 Hypertensive heart disease with heart failure: Secondary | ICD-10-CM | POA: Diagnosis not present

## 2021-07-22 DIAGNOSIS — Z951 Presence of aortocoronary bypass graft: Secondary | ICD-10-CM | POA: Diagnosis not present

## 2021-07-22 DIAGNOSIS — E669 Obesity, unspecified: Secondary | ICD-10-CM | POA: Diagnosis present

## 2021-07-22 DIAGNOSIS — K626 Ulcer of anus and rectum: Secondary | ICD-10-CM | POA: Diagnosis not present

## 2021-07-22 DIAGNOSIS — K5641 Fecal impaction: Secondary | ICD-10-CM | POA: Diagnosis not present

## 2021-07-22 DIAGNOSIS — R300 Dysuria: Secondary | ICD-10-CM

## 2021-07-22 DIAGNOSIS — I25119 Atherosclerotic heart disease of native coronary artery with unspecified angina pectoris: Secondary | ICD-10-CM

## 2021-07-22 DIAGNOSIS — R42 Dizziness and giddiness: Secondary | ICD-10-CM

## 2021-07-22 DIAGNOSIS — Z743 Need for continuous supervision: Secondary | ICD-10-CM | POA: Diagnosis not present

## 2021-07-22 DIAGNOSIS — E876 Hypokalemia: Secondary | ICD-10-CM

## 2021-07-22 DIAGNOSIS — K5289 Other specified noninfective gastroenteritis and colitis: Secondary | ICD-10-CM | POA: Diagnosis not present

## 2021-07-22 DIAGNOSIS — E8809 Other disorders of plasma-protein metabolism, not elsewhere classified: Secondary | ICD-10-CM

## 2021-07-22 DIAGNOSIS — M79671 Pain in right foot: Secondary | ICD-10-CM

## 2021-07-22 DIAGNOSIS — Z7984 Long term (current) use of oral hypoglycemic drugs: Secondary | ICD-10-CM | POA: Diagnosis not present

## 2021-07-22 DIAGNOSIS — E119 Type 2 diabetes mellitus without complications: Secondary | ICD-10-CM

## 2021-07-22 DIAGNOSIS — R5383 Other fatigue: Secondary | ICD-10-CM | POA: Diagnosis not present

## 2021-07-22 DIAGNOSIS — Z87891 Personal history of nicotine dependence: Secondary | ICD-10-CM | POA: Diagnosis not present

## 2021-07-22 DIAGNOSIS — Z794 Long term (current) use of insulin: Secondary | ICD-10-CM

## 2021-07-22 DIAGNOSIS — Z8673 Personal history of transient ischemic attack (TIA), and cerebral infarction without residual deficits: Secondary | ICD-10-CM | POA: Diagnosis not present

## 2021-07-22 DIAGNOSIS — R109 Unspecified abdominal pain: Secondary | ICD-10-CM | POA: Diagnosis not present

## 2021-07-22 DIAGNOSIS — I700 Atherosclerosis of aorta: Secondary | ICD-10-CM | POA: Diagnosis not present

## 2021-07-22 LAB — URINALYSIS, ROUTINE W REFLEX MICROSCOPIC
Bilirubin Urine: NEGATIVE
Glucose, UA: >=500 mg/dL — AB
Ketones, ur: 5 mg/dL — AB
Leukocytes,Ua: NEGATIVE
Nitrite: NEGATIVE
Protein, ur: 30 mg/dL — AB
Specific Gravity, Urine: 1.031 — ABNORMAL HIGH (ref 1.005–1.030)
pH: 5 (ref 5.0–8.0)

## 2021-07-22 LAB — CBC
HCT: 39.4 % (ref 36.0–46.0)
Hemoglobin: 12.5 g/dL (ref 12.0–15.0)
MCH: 27.2 pg (ref 26.0–34.0)
MCHC: 31.7 g/dL (ref 30.0–36.0)
MCV: 85.7 fL (ref 80.0–100.0)
Platelets: 388 10*3/uL (ref 150–400)
RBC: 4.6 MIL/uL (ref 3.87–5.11)
RDW: 17.5 % — ABNORMAL HIGH (ref 11.5–15.5)
WBC: 8.4 10*3/uL (ref 4.0–10.5)
nRBC: 0 % (ref 0.0–0.2)

## 2021-07-22 LAB — BASIC METABOLIC PANEL
Anion gap: 9 (ref 5–15)
BUN: 13 mg/dL (ref 8–23)
CO2: 21 mmol/L — ABNORMAL LOW (ref 22–32)
Calcium: 9.5 mg/dL (ref 8.9–10.3)
Chloride: 111 mmol/L (ref 98–111)
Creatinine, Ser: 0.92 mg/dL (ref 0.44–1.00)
GFR, Estimated: >60 mL/min (ref >60)
Glucose, Bld: 149 mg/dL — ABNORMAL HIGH (ref 70–99)
Potassium: 4 mmol/L (ref 3.5–5.1)
Sodium: 141 mmol/L (ref 135–145)

## 2021-07-22 LAB — GLUCOSE, CAPILLARY: Glucose-Capillary: 164 mg/dL — ABNORMAL HIGH (ref 70–99)

## 2021-07-22 MED ORDER — SENNOSIDES-DOCUSATE SODIUM 8.6-50 MG PO TABS
2.0000 | ORAL_TABLET | Freq: Every day | ORAL | Status: DC
  Administered 2021-07-22 – 2021-07-23 (×2): 2 via ORAL
  Filled 2021-07-22 (×2): qty 2

## 2021-07-22 MED ORDER — OXYCODONE-ACETAMINOPHEN 10-325 MG PO TABS: 1.0000 | ORAL_TABLET | ORAL | Status: DC | PRN

## 2021-07-22 MED ORDER — WARFARIN SODIUM 3 MG PO TABS: 3.0000 mg | ORAL_TABLET | Freq: Every day | ORAL | Status: DC

## 2021-07-22 MED ORDER — OXYCODONE-ACETAMINOPHEN 5-325 MG PO TABS
1.0000 | ORAL_TABLET | ORAL | Status: DC | PRN
  Administered 2021-07-27: 1 via ORAL
  Filled 2021-07-22 (×2): qty 1

## 2021-07-22 MED ORDER — PANTOPRAZOLE SODIUM 40 MG PO TBEC
40.0000 mg | DELAYED_RELEASE_TABLET | Freq: Every day | ORAL | Status: DC
  Administered 2021-07-22 – 2021-07-27 (×6): 40 mg via ORAL
  Filled 2021-07-22 (×6): qty 1

## 2021-07-22 MED ORDER — SORBITOL 70 % SOLN
300.0000 mL | TOPICAL_OIL | Freq: Once | ORAL | Status: AC
  Administered 2021-07-22: 300 mL via RECTAL
  Filled 2021-07-22: qty 90

## 2021-07-22 MED ORDER — METOPROLOL SUCCINATE ER 100 MG PO TB24
100.0000 mg | ORAL_TABLET | Freq: Two times a day (BID) | ORAL | Status: DC
  Administered 2021-07-22 – 2021-07-27 (×10): 100 mg via ORAL
  Filled 2021-07-22 (×10): qty 1

## 2021-07-22 MED ORDER — MINERAL OIL RE ENEM
1.0000 | ENEMA | Freq: Once | RECTAL | Status: AC
  Administered 2021-07-22: 1 via RECTAL
  Filled 2021-07-22 (×2): qty 1

## 2021-07-22 MED ORDER — LIDOCAINE-EPINEPHRINE-TETRACAINE (LET) TOPICAL GEL
3.0000 mL | Freq: Once | TOPICAL | Status: AC
  Administered 2021-07-22: 3 mL via TOPICAL
  Filled 2021-07-22: qty 3

## 2021-07-22 MED ORDER — WARFARIN - PHARMACIST DOSING INPATIENT: Freq: Every day | Status: DC

## 2021-07-22 MED ORDER — ALPRAZOLAM 0.25 MG PO TABS
0.5000 mg | ORAL_TABLET | Freq: Two times a day (BID) | ORAL | Status: DC
  Administered 2021-07-22 – 2021-07-27 (×10): 0.5 mg via ORAL
  Filled 2021-07-22 (×10): qty 2

## 2021-07-22 MED ORDER — SODIUM CHLORIDE 0.9% FLUSH
3.0000 mL | Freq: Two times a day (BID) | INTRAVENOUS | Status: DC
  Administered 2021-07-22 – 2021-07-27 (×9): 3 mL via INTRAVENOUS

## 2021-07-22 MED ORDER — ACETAMINOPHEN 650 MG RE SUPP: 650.0000 mg | Freq: Four times a day (QID) | RECTAL | Status: DC | PRN

## 2021-07-22 MED ORDER — ONDANSETRON HCL 4 MG PO TABS: 4.0000 mg | ORAL_TABLET | Freq: Four times a day (QID) | ORAL | Status: DC | PRN

## 2021-07-22 MED ORDER — SENNOSIDES-DOCUSATE SODIUM 8.6-50 MG PO TABS: 1.0000 | ORAL_TABLET | Freq: Every day | ORAL | 2 refills | Status: DC

## 2021-07-22 MED ORDER — OXYCODONE HCL 5 MG PO TABS
5.0000 mg | ORAL_TABLET | ORAL | Status: DC | PRN
Start: 2021-07-22 — End: 2021-07-27
  Administered 2021-07-22 – 2021-07-27 (×3): 5 mg via ORAL
  Filled 2021-07-22 (×3): qty 1

## 2021-07-22 MED ORDER — SACUBITRIL-VALSARTAN 49-51 MG PO TABS
2.0000 | ORAL_TABLET | Freq: Two times a day (BID) | ORAL | Status: DC
  Administered 2021-07-22: 2 via ORAL
  Filled 2021-07-22 (×3): qty 2

## 2021-07-22 MED ORDER — INSULIN GLARGINE-YFGN 100 UNIT/ML ~~LOC~~ SOLN
24.0000 [IU] | Freq: Every day | SUBCUTANEOUS | Status: DC
  Administered 2021-07-22 – 2021-07-26 (×5): 24 [IU] via SUBCUTANEOUS
  Filled 2021-07-22 (×7): qty 0.24

## 2021-07-22 MED ORDER — FUROSEMIDE 20 MG PO TABS
10.0000 mg | ORAL_TABLET | Freq: Every morning | ORAL | Status: DC
  Administered 2021-07-23 – 2021-07-26 (×4): 10 mg via ORAL
  Filled 2021-07-22 (×4): qty 1

## 2021-07-22 MED ORDER — ACETAMINOPHEN 325 MG PO TABS: 650.0000 mg | ORAL_TABLET | Freq: Four times a day (QID) | ORAL | Status: DC | PRN

## 2021-07-22 MED ORDER — ATORVASTATIN CALCIUM 40 MG PO TABS
40.0000 mg | ORAL_TABLET | Freq: Every day | ORAL | Status: DC
  Administered 2021-07-22 – 2021-07-26 (×5): 40 mg via ORAL
  Filled 2021-07-22 (×6): qty 1

## 2021-07-22 MED ORDER — NITROGLYCERIN 0.4 MG SL SUBL: 0.4000 mg | SUBLINGUAL_TABLET | SUBLINGUAL | Status: DC | PRN

## 2021-07-22 MED ORDER — ALPRAZOLAM 0.25 MG PO TABS
1.0000 mg | ORAL_TABLET | Freq: Every day | ORAL | Status: DC
  Administered 2021-07-23 – 2021-07-27 (×5): 1 mg via ORAL
  Filled 2021-07-22 (×5): qty 4

## 2021-07-22 MED ORDER — WARFARIN SODIUM 5 MG PO TABS
5.0000 mg | ORAL_TABLET | Freq: Once | ORAL | Status: AC
  Administered 2021-07-22: 5 mg via ORAL
  Filled 2021-07-22: qty 1

## 2021-07-22 MED ORDER — SPIRONOLACTONE 25 MG PO TABS
25.0000 mg | ORAL_TABLET | Freq: Every day | ORAL | Status: DC
  Administered 2021-07-23 – 2021-07-27 (×5): 25 mg via ORAL
  Filled 2021-07-22 (×5): qty 1

## 2021-07-22 MED ORDER — THIAMINE HCL 100 MG PO TABS
100.0000 mg | ORAL_TABLET | Freq: Every day | ORAL | Status: DC
  Administered 2021-07-23 – 2021-07-27 (×5): 100 mg via ORAL
  Filled 2021-07-22 (×5): qty 1

## 2021-07-22 MED ORDER — GABAPENTIN 300 MG PO CAPS
300.0000 mg | ORAL_CAPSULE | Freq: Three times a day (TID) | ORAL | Status: DC
  Administered 2021-07-22 – 2021-07-27 (×15): 300 mg via ORAL
  Filled 2021-07-22 (×15): qty 1

## 2021-07-22 MED ORDER — INSULIN GLARGINE (2 UNIT DIAL) 300 UNIT/ML ~~LOC~~ SOPN: 30.0000 [IU] | PEN_INJECTOR | Freq: Every day | SUBCUTANEOUS | Status: DC

## 2021-07-22 MED ORDER — IOHEXOL 300 MG/ML  SOLN
100.0000 mL | Freq: Once | INTRAMUSCULAR | Status: AC | PRN
  Administered 2021-07-22: 100 mL via INTRAVENOUS

## 2021-07-22 MED ORDER — INSULIN ASPART 100 UNIT/ML IJ SOLN
0.0000 [IU] | Freq: Three times a day (TID) | INTRAMUSCULAR | Status: DC
  Administered 2021-07-22 – 2021-07-23 (×2): 3 [IU] via SUBCUTANEOUS
  Administered 2021-07-23: 2 [IU] via SUBCUTANEOUS
  Administered 2021-07-24: 3 [IU] via SUBCUTANEOUS
  Administered 2021-07-24: 2 [IU] via SUBCUTANEOUS
  Administered 2021-07-24 – 2021-07-25 (×2): 3 [IU] via SUBCUTANEOUS
  Administered 2021-07-25: 2 [IU] via SUBCUTANEOUS
  Administered 2021-07-25 – 2021-07-26 (×5): 3 [IU] via SUBCUTANEOUS
  Administered 2021-07-27: 2 [IU] via SUBCUTANEOUS

## 2021-07-22 MED ORDER — ALPRAZOLAM 0.25 MG PO TABS: 0.5000 mg | ORAL_TABLET | ORAL | Status: DC

## 2021-07-22 MED ORDER — ONDANSETRON HCL 4 MG/2ML IJ SOLN: 4.0000 mg | Freq: Four times a day (QID) | INTRAMUSCULAR | Status: DC | PRN

## 2021-07-22 MED ORDER — POLYETHYLENE GLYCOL 3350 17 G PO PACK
17.0000 g | PACK | Freq: Two times a day (BID) | ORAL | Status: DC
  Administered 2021-07-22 – 2021-07-27 (×10): 17 g via ORAL
  Filled 2021-07-22 (×11): qty 1

## 2021-07-22 NOTE — Assessment & Plan Note (Addendum)
   Patient presenting with substantial abdominal pain and several weeks of constipation  Likely primarily due to ongoing opiate use with noncompliance with bowel regimen use possibly exacerbated by relative constipation due to ongoing diuretics  Patient is already undergone 2 attempts at fecal disimpaction without significant improvement; having liquidy bowel movements but could be concern for overflow diarrhea  Was given a mineral oil enema with MiraLAX and Senna-Docusate overnight.   Encouraging patient to minimize opiate use is much as possible, patient is on a substantial regimen of oxycodone.  Once impaction has resolved, patient would likely benefit from initiation of Relistor or Amitiza If not improving further will consider Gastroenterology Evaluation  -Repeat KUB this afternoon showed "Bowel gas pattern is nonspecific. Small amount of stool is seen in the she did not get the. There is no demonstrable fecal impaction in the rectum. Surgical clips are seen in the right upper quadrant. No abnormal masses or calcifications are seen in the abdomen. Kidneys are partly obscured by bowel contents. There is contrast in the urinary bladder from previous CT. Phleboliths are seen in the pelvis." -Patient had a bowel movement yesterday however she states that she is having normal bowel movements and is concerned about constipation having lower abdominal pain.  We will give her milk of molasses enema given that smog enema is on backorder -She will need adequate bowel regimen for discharge -We will continue laxatives with stool softeners as well as enema today; she did not get small because it is on backorder and milk and molasses was not given in yesterday given that she did not take it in the evening so will be given today -She improved with the use of an enema and laxatives.  Now she is having runny stools and will cut back and discharge her with once a day MiraLAX and senna docusate nightly.  She is  stable from this standpoint and can follow-up with her PCP for further evaluation -PT OT recommending home health and she will be sent home with a rolling walker and DME 3 and 1 bedside commode

## 2021-07-22 NOTE — ED Notes (Signed)
Pt verbalized understanding of d/c instructions, prescription and follow up care. Pt wheeled out of ED via wheelchair.

## 2021-07-22 NOTE — ED Provider Notes (Signed)
[facility] EMERGENCY DEPARTMENT Provider Note   CSN: [id number] Arrival date & time: 07/22/21  1337     History  Chief Complaint  Patient presents with   Constipation    Kristen [name] is a 76 y.o. female with medical history of hypertension, diabetes, CAD.  Patient presents to ED with ongoing rectal pain and constipation.  Patient seen last night in this department, was disimpacted and sent home with stool softeners.  Patient returns today with continued pain in rectum.  Patient states that since being discharged last night, she has urinated on herself 3 times, states that she does have burning with urination.  The patient denies any trauma to her lower back.  Patient states she has low back pain however this is chronic.  Patient also endorsing some lower abdominal pain that has been present for some time now.  The patient denies any nausea, vomiting, fevers, chest pain or shortness of breath.  Patient also raises the concern of bedbugs or parasites in her stool however cannot elaborate.   Constipation Associated symptoms: abdominal pain, back pain and dysuria   Associated symptoms: no fever, no nausea and no vomiting       Home Medications Prior to Admission medications   Medication Sig Start Date End Date Taking? Authorizing Provider  ALPRAZolam Duanne Moron) 1 MG tablet 1  qam  half  midday  half qhs 06/02/21   [name], MD  atorvastatin (LIPITOR) 40 MG tablet Take 1 tablet (40 mg total) by mouth daily. 07/09/21   [name], MD  Blood Glucose Monitoring Suppl (ONE TOUCH ULTRA 2) w/Device KIT Inject 1 Act into the skin 3 (three) times daily. Use TID 06/02/21   [name], MD  Cholecalciferol (VITAMIN D3) 50 MCG (2000 UT) capsule Take 2,000 Units by mouth daily.    [provider]  Continuous Blood Gluc Receiver (FREESTYLE LIBRE 2 READER) DEVI 1 Act by Does not apply route daily. 06/01/21   [name], MD  Continuous Blood Gluc Sensor  (FREESTYLE LIBRE 2 SENSOR) MISC 1 Act by Does not apply route daily. 06/01/21   [name], MD  furosemide (LASIX) 20 MG tablet Take 0.5 tablets (10 mg total) by mouth every morning. 07/21/21   Sherren Mocha, MD  gabapentin (NEURONTIN) 300 MG capsule TAKE ONE CAPSULE BY MOUTH three times daily 10/29/20   [name], MD  glucose blood (COOL BLOOD GLUCOSE TEST STRIPS) test strip 1 each by Other route 3 (three) times daily. Use to test blood sugar TID. DX: E11.9 06/02/21   [name], MD  insulin glargine, 2 Unit Dial, (TOUJEO MAX SOLOSTAR) 300 UNIT/ML Solostar Pen Inject 30 Units into the skin daily. 07/09/21   [name], MD  JARDIANCE 25 MG TABS tablet Take 1 tablet (25 mg total) by mouth daily. Via BI CARES pt assistance 03/17/21   [name], MD  Lancets [facility] ULTRASOFT) lancets Use as instructed 05/18/19   [name], MD  magnesium oxide (MAG-OX) 400 (240 Mg) MG tablet Take 1 tablet (400 mg total) by mouth 2 (two) times daily. Take in the morning and evening. 05/24/21   Sherren Mocha, MD  meclizine (ANTIVERT) 25 MG tablet Take 1 tablet (25 mg total) by mouth 3 (three) times daily as needed for dizziness. 07/08/21   [name], MD  metFORMIN (GLUCOPHAGE-XR) 500 MG 24 hr tablet Take 3 tablets (1,500 mg total) by mouth daily with breakfast. 03/18/21  [name], MD  metoprolol succinate (TOPROL-XL) 100 MG 24 hr tablet TAKE ONE TABLET BY MOUTH TWICE DAILY WITH OR immediately following A meal 11/30/20   Sherren Mocha, MD  nitroGLYCERIN (NITROSTAT) 0.4 MG SL tablet Place 1 tablet (0.4 mg total) under the tongue every 5 (five) minutes as needed for chest pain (x 3 doses). Reported on 05/08/2015 03/11/20   Sherren Mocha, MD  NOVOFINE PEN NEEDLE 32G X 6 MM MISC USE with insulin AS DIRECTED 01/25/21   [name], MD  potassium chloride SA (KLOR-CON) 20 MEQ tablet TAKE ONE TABLET BY MOUTH TWICE DAILY 11/30/20   Sherren Mocha, MD  sacubitril-valsartan (ENTRESTO) 49-51  MG Take 1 tablet by mouth 2 (two) times daily. 07/21/21   Sherren Mocha, MD  Semaglutide, 2 MG/DOSE, (OZEMPIC, 2 MG/DOSE,) 8 MG/3ML SOPN Inject 2 mg into the skin once a week. 03/18/21   [name], MD  senna-docusate (SENOKOT-S) 8.6-50 MG tablet Take 1 tablet by mouth daily. 07/22/21   Maudie Flakes, MD  spironolactone (ALDACTONE) 25 MG tablet TAKE ONE TABLET BY MOUTH ONCE DAILY 05/23/21   [name], MD  thiamine 100 MG tablet Take 1 tablet (100 mg total) by mouth daily. 07/13/21   [name], MD  warfarin (COUMADIN) 3 MG tablet TAKE 1 TABLET DAILY BY MOUTH EXCEPT TAKE  1 AND 1/2 TABLETS ON MONDAYS OR AS DIRECTED BY ANTICOAGULATION CLINIC 06/01/21   [name], MD      Allergies    Metformin and related, Cleocin [clindamycin hcl], Codeine, Doxycycline hyclate, Macrolides and ketolides, Morphine, Pentazocine lactate, Vibramycin [doxycycline calcium], Clindamycin, Definity [perflutren lipid microsphere], Doxycycline, Metformin, Pentazocine, Perflutren, Perflutren lipid microspheres, Sulfa antibiotics, and Sulfonamide derivatives    Review of Systems   Review of Systems  Constitutional:  Negative for chills and fever.  Respiratory:  Negative for shortness of breath.   Cardiovascular:  Negative for chest pain.  Gastrointestinal:  Positive for abdominal pain and constipation. Negative for nausea and vomiting.  Genitourinary:  Positive for dysuria.  Musculoskeletal:  Positive for back pain.  All other systems reviewed and are negative.  Physical Exam Updated Vital Signs BP 120/61   Pulse 92   Temp 98.9 F (37.2 C) (Oral)   Resp (!) 21   Ht _0  (1.549 m)   Wt 79.4 kg   SpO2 98%   BMI 33.07 kg/m  Physical Exam Vitals and nursing note reviewed. Exam conducted with a chaperone present.  Constitutional:      General: She is not in acute distress.    Appearance: Normal appearance. She is not ill-appearing, toxic-appearing or diaphoretic.  HENT:     Head: Normocephalic  and atraumatic.     Nose: Nose normal. No congestion.     Mouth/Throat:     Mouth: Mucous membranes are moist.     Pharynx: Oropharynx is clear.  Eyes:     Extraocular Movements: Extraocular movements intact.     Conjunctiva/sclera: Conjunctivae normal.     Pupils: Pupils are equal, round, and reactive to light.  Cardiovascular:     Rate and Rhythm: Normal rate and regular rhythm.  Pulmonary:     Effort: Pulmonary effort is normal.     Breath sounds: Normal breath sounds. No wheezing.  Abdominal:     General: Abdomen is flat. Bowel sounds are normal.     Palpations: Abdomen is soft.     Tenderness: There is abdominal tenderness.  Genitourinary:    Exam position:  Knee-chest position.     Rectum: Tenderness present.     Comments: Patient obviously impacted on rectal exam Musculoskeletal:     Cervical back: Normal range of motion and neck supple.  Neurological:     Mental Status: She is alert.    ED Results / Procedures / Treatments   Labs (all labs ordered are listed, but only abnormal results are displayed) Labs Reviewed  CBC - Abnormal; Notable for the following components:      Result Value   RDW 17.5 (*)    All other components within normal limits  URINE CULTURE  BASIC METABOLIC PANEL    EKG None  Radiology No results found.  Procedures Procedures    Medications Ordered in ED Medications  sorbitol, milk of mag, mineral oil, glycerin (SMOG) enema (has no administration in time range)  lidocaine-EPINEPHrine-tetracaine (LET) topical gel (3 mLs Topical Given 07/22/21 1549)    ED Course/ Medical Decision Making/ A&P                           Medical Decision Making Amount and/or Complexity of Data Reviewed Labs: ordered. Radiology: ordered.   76 year old female presents ED for evaluation.  Please see HPI for further details.  On examination, the patient is clearly uncomfortable.  She is alert and orient x3.  She is afebrile, nontachycardic.  She is not  hypoxic on room air with good lung sounds bilaterally.  The patient has diffuse, nonlocalized lower abdominal pain.  Patient has obvious impaction on rectal examination.  On chart view, it appears that this patient was here last night for the same complaint of constipation and had disimpaction performed.  This patient most likely has reinfected herself.  Patient will be worked up utilizing the following labs and imaging studies interpreted by me personally: - CBC unremarkable - BMP pending - Urine culture pending - CT abdomen pelvis pending  Patient provided with lidocaine epinephrine tetracaine gel on the rectum for pain relief.  Patient will receive smog enema.  At end my shift, the patient's work-up was not yet complete.  The patient will be signed out to resident Sondra Come, MD for further management and disposition.  Dr. Royce Macadamia will disposition the patient accordingly.  All pertinent lab values, physical exam findings have been discussed with Dr. Royce Macadamia prior to shift handoff.   Final Clinical Impression(s) / ED Diagnoses Final diagnoses:  Constipation, unspecified constipation type    Rx / DC Orders ED Discharge Orders     None         Lawana Chambers 07/22/21 1601    Dorie Rank, MD 07/23/21 [phone]

## 2021-07-22 NOTE — Assessment & Plan Note (Signed)
See assessment and plan above 

## 2021-07-22 NOTE — Assessment & Plan Note (Addendum)
.   Continuing home regimen of lipid lowering therapy with Atorvastatin 40 mg po qHS

## 2021-07-22 NOTE — Assessment & Plan Note (Addendum)
.   No clinical evidence of cardiogenic volume overload . Continue home regimen of diuretics including Furosemide 10 mg po Daily but will go back up to 20 mg p.o. daily given my discussion with the patient and Spironolactone 25 mg po daily and Sacubitril-Valsartan 49-51 1 tab po BID  . -Follow-up with Dr. Burt Knack and have diuretics adjusted if necessary in the outpatient setting . -Chest x-ray today showed no active disease

## 2021-07-22 NOTE — Assessment & Plan Note (Addendum)
   Due to tenuous oral intake placing patient on a somewhat reduced regimen of basal insulin therapy  C/w Accu-Cheks before every meal and nightly with sliding scale insulin  Diabetic diet initiated  Hemoglobin A1c ordered and is now 7.8  CBG's ranging from 152-192

## 2021-07-22 NOTE — ED Provider Notes (Signed)
  Physical Exam  BP 120/75   Pulse 97   Temp 98.5 F (36.9 C) (Oral)   Resp 18   Ht '5\' 1"'$  (1.549 m)   Wt 79.4 kg   SpO2 99%   BMI 33.07 kg/m   Physical Exam  Procedures  Procedures  ED Course / MDM    Medical Decision Making Problems Addressed: Constipation, unspecified constipation type: complicated acute illness or injury with systemic symptoms  Amount and/or Complexity of Data Reviewed Labs: ordered. Decision-making details documented in ED Course. Radiology: ordered and independent interpretation performed. Decision-making details documented in ED Course.  Risk Prescription drug management. Decision regarding hospitalization.   76 year old female with a history of obesity, DM, HLD, HTN, fibromyalgia, hypothyroidism, prior CVA, CHF, chronic kidney pathic constipation, CAD, atrial fibrillation on warfarin, CKD who presented to the ED with rectal pain.  Patient was evaluated in the ED last night for rectal pain and dysuria.  She was disimpacted last night and sent home on Colace but returns today with increasing rectal pain.  She was again disimpacted in the ED and a soapsuds enema has been ordered.  She does endorse continued dysuria.  Labs are pending.  CT abdomen/pelvis with contrast also pending.  CBC without leukocytosis and with a normal hemoglobin of 12.5.  BMP notable for a mildly low bicarb at 21.  CT abdomen/pelvis concerning for stercoral colitis.  On reevaluation, patient continues to endorse pain and is diffusely tender over her lower abdomen.  Given the findings on her CT as well as her continued pain, I have recommended admission for further management.  Hospitalist service was contacted for admission and the patient was admitted to their service in stable condition.       Sondra Come, MD 07/22/21 2107    Lajean Saver, MD 07/22/21 [phone]

## 2021-07-22 NOTE — ED Triage Notes (Signed)
Pt arrived via [facility] from home with c/c of Constipation. Per EMS pt has had pain ion rectum, felt need to go to have a bowel movement but wasn't able to have a stool. Pt did say she was had pain in rectum.  Recently d/c with oxy and was disimpacted last visit (5/17). Hx of heart surgery, stroke.   142/96, CBG 180, 100HR, 98% RA

## 2021-07-22 NOTE — Assessment & Plan Note (Addendum)
.   Patient is currently chest pain free . Continue Monitoring patient on telemetry . Continue home regimen of lipid lowering therapy and AV nodal blocking therapy

## 2021-07-22 NOTE — Progress Notes (Signed)
ANTICOAGULATION CONSULT NOTE - Initial Consult  Pharmacy Consult for warfarin Indication: atrial fibrillation  Allergies  Allergen Reactions   Metformin And Related Other (See Comments)    Must take XR form only, cannot tolerate Regular release metformin   Cleocin [Clindamycin Hcl] Diarrhea   Codeine Itching   Doxycycline Hyclate Diarrhea   Macrolides And Ketolides Diarrhea   Morphine Hives   Pentazocine Lactate Itching and Nausea And Vomiting    (GENERIC- [place])   Vibramycin [Doxycycline Calcium] Diarrhea and Rash   Clindamycin Diarrhea   Definity [Perflutren Lipid Microsphere] Other (See Comments)    Patient complained of warm sensation in chest x few seconds duration when injected Definity.No other symptoms   Doxycycline Diarrhea   Metformin Other (See Comments)   Pentazocine Nausea Only   Perflutren Other (See Comments)   Perflutren Lipid Microspheres Other (See Comments)   Sulfa Antibiotics Diarrhea and Rash   Sulfonamide Derivatives Rash    Patient Measurements: Height: '5\' 1"'$  (154.9 cm) Weight: 79.4 kg (175 lb) IBW/kg (Calculated) : 47.8 Heparin Dosing Weight: NA  Vital Signs: Temp: 98.5 F (36.9 C) (05/18 2020) Temp Source: Oral (05/18 2020) BP: 120/75 (05/18 2015) Pulse Rate: 97 (05/18 2015)  Labs: Recent Labs    07/21/21 2254 07/22/21 1513  HGB 13.2 12.5  HCT 41.9 39.4  PLT 374 388  LABPROT 23.0*  --   INR 2.1*  --   CREATININE 0.99 0.92    Estimated Creatinine Clearance: 51.9 mL/min (by C-G formula based on SCr of 0.92 mg/dL).   Medical History: Past Medical History:  Diagnosis Date   Arthritis    Blood transfusion without reported diagnosis    Bursitis    CAD (coronary artery disease)    a. s/p CABG 2004.b. stable cath 2014 demonstrating stable CAD and continued patency of her LIMA graft.   Chronic anticoagulation    on coumadin   DDD (degenerative disc disease), lumbar    Depression    Diabetes mellitus    Diastolic dysfunction     per echo in October 2012 with EF 50 to 55%   Fibromyalgia    GERD (gastroesophageal reflux disease) 10/23/2003   Headache(784.0)    Hyperlipidemia    Hypertension    Hypokalemia    LBBB (left bundle branch block)    Lumbar back pain    LV dysfunction    a. EF 45% by cath 2014. b. EF 50-55% by technically difficult echo in 08/2014.   Lymphadenitis    Morbid obesity ([place])    a. Sleep study negative for significant OSA in 11/2014.   PAF (paroxysmal atrial fibrillation) ([place])    Stroke ([place]) 2004   affected speech per pt    Medications:  Scheduled:   ALPRAZolam  0.5 mg Oral BID   [START ON 07/23/2021] ALPRAZolam  1 mg Oral Q0600   atorvastatin  40 mg Oral QHS   [START ON 07/23/2021] furosemide  10 mg Oral q morning   gabapentin  300 mg Oral TID   insulin aspart  0-15 Units Subcutaneous TID AC & HS   insulin glargine-yfgn  24 Units Subcutaneous QHS   metoprolol succinate  100 mg Oral BID   mineral oil  1 enema Rectal Once   pantoprazole  40 mg Oral Daily   polyethylene glycol  17 g Oral BID   sacubitril-valsartan  2 tablet Oral BID   senna-docusate  2 tablet Oral QHS   sodium chloride flush  3 mL Intravenous Q12H   [START  ON 07/23/2021] spironolactone  25 mg Oral Daily   [START ON 07/23/2021] thiamine  100 mg Oral Daily   warfarin  3-4.5 mg Oral QHS    Assessment: 76 yo F here for rectal pain and constipation. On warfarin PTA. PTA regimen 4.5 mg Monday and 3 mg on all other days.   INR 2.1 on 5/17.   Per med rec last dose warfarin 5/16 (missed dose 5/17, was here in the ED and not given here).   Goal of Therapy:  INR 2-3 Monitor platelets by anticoagulation protocol: Yes   Plan:  Warfarin 5 mg tonight (higher dose given no warfarin givne 5/17, INR was also at the lower end of goal on 5/16) INR daily   Anderson Malta A Edgel Degnan 07/22/2021,9:01 PM

## 2021-07-22 NOTE — Assessment & Plan Note (Addendum)
   Currently rate controlled.  Continue home regimen of anticoagulation with Coumadin with Pharmacy   Continue home regimen of rate controlling agent with metoprolol twice daily  Continue Monitoring on Telemetry while hospitalized and can follow-up in outpatient setting with her primary cardiologist Dr. Burt Knack

## 2021-07-22 NOTE — Assessment & Plan Note (Addendum)
.   Continuing home regimen of daily PPI therapy Pantoprazole 40 mg po daily.

## 2021-07-22 NOTE — Telephone Encounter (Signed)
PT calls in regards to a follow up from the [place] had visited the hospital due to abdominal pain, issues with stool. While there she was diagnosed with impacted stool. PT is now experiencing stool drainage, states it is hard to clean herself, and she would just like to know the next plan of action or suggestions going forward.   CB: [phone]

## 2021-07-22 NOTE — Discharge Instructions (Addendum)
You were evaluated in the Emergency Department and after careful evaluation, we did not find any emergent condition requiring admission or further testing in the hospital.  Your exam/testing today is overall reassuring.  Symptoms seem to be due to a fecal impaction.  We disimpacted you here in the emergency department.  Recommend daily use of the senna medication prescribed to help you have regular bowel movements and avoid this problem in the future.  Please return to the Emergency Department if you experience any worsening of your condition.   Thank you for allowing [place] to be a part of your care.

## 2021-07-22 NOTE — H&P (Signed)
History and Physical    Patient: Kristen [name] MRN: [id number] DOA: 07/22/2021  Date of Service: the patient was seen and examined on 07/22/2021  Patient coming from: Home via EMS  Chief Complaint:  Chief Complaint  Patient presents with   Constipation    HPI:   76 year old female with past medical history of coronary artery disease status post CABG, paroxysmal atrial fibrillation, hyperlipidemia, insulin-dependent diabetes mellitus type 2, systolic and diastolic congestive heart failure (Echo 05/2021 EF 25-30% with G1DD), gastroesophageal reflux disease, remote history of prior stroke and left bundle branch block who presents to [facility] emergency department via EMS with complaints of constipation and rectal pain.  Patient explains that for the past several weeks she has had been experiencing increasingly worsening constipation.  Patient states that when she does move her bowels her bowel movements are very small.  As the patient's symptoms have worsened patient is developed progressively worsening lower abdominal and rectal pain.  Patient describes his pain as sore in quality, worse with movement and quite severe with attempts at moving her bowels.  Patient denies any associated fevers, sick contacts, recent travel.  Patient symptoms continue to worsen and she eventually presented to [facility] emergency department the evening of 5/17.  At that time patient underwent fecal disimpaction by the emergency department provider and was discharged home on oral senna and docusate.  In the following 24 hours patient continued to have worsening lower abdominal pain that became more and more severe.  This came associated with low back pain and a sensation of dysuria with bouts of urinary incontinence.  Due to persisting symptoms the patient presented again to [facility] emergency permit via EMS for evaluation.  Upon evaluation in the emergency department CT imaging  of the abdomen and pelvis were performed revealing no evidence of perforation but identifying distention of the rectum with rectal wall thickening and enhancement consistent with stercoral colitis.  A repeat attempt at disimpaction was performed followed by administration of a smog enema.  Due to minimal improvement with these interventions and ongoing substantial symptoms ER providers requesting hospitalist group evaluate patient for admission to the hospital.  Review of Systems: Review of Systems  Gastrointestinal:  Positive for abdominal pain and constipation.    Past Medical History:  Diagnosis Date   Arthritis    Blood transfusion without reported diagnosis    Bursitis    CAD (coronary artery disease)    a. s/p CABG 2004.b. stable cath 2014 demonstrating stable CAD and continued patency of her LIMA graft.   Chronic anticoagulation    on coumadin   DDD (degenerative disc disease), lumbar    Depression    Diabetes mellitus    Diastolic dysfunction    per echo in October 2012 with EF 50 to 55%   Fibromyalgia    GERD (gastroesophageal reflux disease) 10/23/2003   Headache(784.0)    Hyperlipidemia    Hypertension    Hypokalemia    LBBB (left bundle branch block)    Lumbar back pain    LV dysfunction    a. EF 45% by cath 2014. b. EF 50-55% by technically difficult echo in 08/2014.   Lymphadenitis    Morbid obesity ([place])    a. Sleep study negative for significant OSA in 11/2014.   PAF (paroxysmal atrial fibrillation) ([place])    Stroke [facility]) 2004   affected speech per pt    Past Surgical History:  Procedure Laterality Date   ABDOMINAL HYSTERECTOMY  ANGIOPLASTY  laminectomy   CHOLECYSTECTOMY     CORONARY ARTERY BYPASS GRAFT     LIMA to LAD    KNEE ARTHROSCOPY     LEFT HEART CATHETERIZATION WITH CORONARY/GRAFT ANGIOGRAM  12/07/2012   Procedure: LEFT HEART CATHETERIZATION WITH Beatrix Fetters;  Surgeon: Blane Ohara, MD;  Location: [facility] CATH LAB;  Service:  Cardiovascular;;   LUMBAR LAMINECTOMY     x3   SPHINCTEROTOMY     TONSILLECTOMY      Social History:  reports that she quit smoking about 20 years ago. Her smoking use included cigarettes. She has been exposed to tobacco smoke. She has never used smokeless tobacco. She reports that she does not drink alcohol and does not use drugs.  Allergies  Allergen Reactions   Metformin And Related Other (See Comments)    Must take XR form only, cannot tolerate Regular release metformin   Cleocin [Clindamycin Hcl] Diarrhea   Codeine Itching   Doxycycline Hyclate Diarrhea   Macrolides And Ketolides Diarrhea   Morphine Hives   Pentazocine Lactate Itching and Nausea And Vomiting    (GENERIC- [place])   Vibramycin [Doxycycline Calcium] Diarrhea and Rash   Clindamycin Diarrhea   Definity [Perflutren Lipid Microsphere] Other (See Comments)    Patient complained of warm sensation in chest x few seconds duration when injected Definity.No other symptoms   Doxycycline Diarrhea   Metformin Other (See Comments)   Pentazocine Nausea Only   Perflutren Other (See Comments)   Perflutren Lipid Microspheres Other (See Comments)   Sulfa Antibiotics Diarrhea and Rash   Sulfonamide Derivatives Rash    Family History  Problem Relation Age of Onset   Heart attack Father    Heart disease Father    Stroke Mother    Kidney disease Other    Stroke Other    Arthritis Other    Hypertension Other    Diabetes Other    Colon cancer Paternal Uncle    Depression Sister    Anxiety disorder Maternal Aunt     Prior to Admission medications   Medication Sig Start Date End Date Taking? Authorizing Provider  ALPRAZolam (XANAX) 1 MG tablet 1  qam  half  midday  half qhs Patient taking differently: Take 0.5-1 mg by mouth See admin instructions. Take 1 tablet in the morning, 1/2 tablet in the afternoon, and 1/2 tablet at night 06/02/21  Yes [name], MD  atorvastatin (LIPITOR) 40 MG tablet Take 1 tablet (40 mg  total) by mouth daily. Patient taking differently: Take 40 mg by mouth at bedtime. 07/09/21  Yes [name], MD  furosemide (LASIX) 20 MG tablet Take 0.5 tablets (10 mg total) by mouth every morning. 07/21/21  Yes Sherren Mocha, MD  gabapentin (NEURONTIN) 300 MG capsule TAKE ONE CAPSULE BY MOUTH three times daily Patient taking differently: Take 300 mg by mouth 3 (three) times daily. 10/29/20  Yes [name], MD  insulin glargine, 2 Unit Dial, (TOUJEO MAX SOLOSTAR) 300 UNIT/ML Solostar Pen Inject 30 Units into the skin daily. Patient taking differently: Inject 50 Units into the skin at bedtime. 07/09/21  Yes [name], MD  JARDIANCE 25 MG TABS tablet Take 1 tablet (25 mg total) by mouth daily. Via BI CARES pt assistance 03/17/21  Yes [name], MD  magnesium oxide (MAG-OX) 400 (240 Mg) MG tablet Take 1 tablet (400 mg total) by mouth 2 (two) times daily. Take in the morning and evening. 05/24/21  Yes Sherren Mocha, MD  meclizine (ANTIVERT) 25 MG tablet Take 1 tablet (25 mg total) by mouth 3 (three) times daily as needed for dizziness. 07/08/21  Yes Etta Grandchild, MD  metFORMIN (GLUCOPHAGE-XR) 500 MG 24 hr tablet Take 3 tablets (1,500 mg total) by mouth daily with breakfast. 03/18/21  Yes Etta Grandchild, MD  metoprolol succinate (TOPROL-XL) 100 MG 24 hr tablet TAKE ONE TABLET BY MOUTH TWICE DAILY WITH OR immediately following A meal Patient taking differently: Take 100 mg by mouth in the morning and at bedtime. 11/30/20  Yes Tonny Bollman, MD  nitroGLYCERIN (NITROSTAT) 0.4 MG SL tablet Place 1 tablet (0.4 mg total) under the tongue every 5 (five) minutes as needed for chest pain (x 3 doses). Reported on 05/08/2015 03/11/20  Yes Tonny Bollman, MD  oxyCODONE-acetaminophen (PERCOCET) 10-325 MG tablet Take 1 tablet by mouth every 4 (four) hours as needed for pain.   Yes [provider]  potassium chloride SA (KLOR-CON) 20 MEQ tablet TAKE ONE TABLET BY MOUTH TWICE DAILY Patient  taking differently: Take 20 mEq by mouth 2 (two) times daily. 11/30/20  Yes Tonny Bollman, MD  sacubitril-valsartan (ENTRESTO) 49-51 MG Take 1 tablet by mouth 2 (two) times daily. Patient taking differently: Take 2 tablets by mouth 2 (two) times daily. 07/21/21  Yes Tonny Bollman, MD  Semaglutide, 2 MG/DOSE, (OZEMPIC, 2 MG/DOSE,) 8 MG/3ML SOPN Inject 2 mg into the skin once a week. Patient taking differently: Inject 2 mg into the skin every Wednesday. 03/18/21  Yes Etta Grandchild, MD  spironolactone (ALDACTONE) 25 MG tablet TAKE ONE TABLET BY MOUTH ONCE DAILY Patient taking differently: Take 25 mg by mouth daily. 05/23/21  Yes Etta Grandchild, MD  thiamine 100 MG tablet Take 1 tablet (100 mg total) by mouth daily. 07/13/21  Yes Etta Grandchild, MD  Blood Glucose Monitoring Suppl (ONE TOUCH ULTRA 2) w/Device KIT Inject 1 Act into the skin 3 (three) times daily. Use TID 06/02/21   Etta Grandchild, MD  Cholecalciferol (VITAMIN D3) 50 MCG (2000 UT) capsule Take 2,000 Units by mouth daily. Patient not taking: Reported on 07/22/2021    [provider]  Continuous Blood Gluc Receiver (FREESTYLE LIBRE 2 READER) DEVI 1 Act by Does not apply route daily. 06/01/21   Etta Grandchild, MD  Continuous Blood Gluc Sensor (FREESTYLE LIBRE 2 SENSOR) MISC 1 Act by Does not apply route daily. 06/01/21   Etta Grandchild, MD  glucose blood (COOL BLOOD GLUCOSE TEST STRIPS) test strip 1 each by Other route 3 (three) times daily. Use to test blood sugar TID. DX: E11.9 06/02/21   Etta Grandchild, MD  Lancets [facility] ULTRASOFT) lancets Use as instructed 05/18/19   Etta Grandchild, MD  NOVOFINE PEN NEEDLE 32G X 6 MM MISC USE with insulin AS DIRECTED 01/25/21   Etta Grandchild, MD  senna-docusate (SENOKOT-S) 8.6-50 MG tablet Take 1 tablet by mouth daily. Patient not taking: Reported on 07/22/2021 07/22/21   Sabas Sous, MD  warfarin (COUMADIN) 3 MG tablet TAKE 1 TABLET DAILY BY MOUTH EXCEPT TAKE  1 AND 1/2 TABLETS ON  MONDAYS OR AS DIRECTED BY ANTICOAGULATION CLINIC Patient taking differently: Take 3-4.5 mg by mouth at bedtime. Take 1 tablet (3 mg) on Tue - Sun. Then take 1 and 1/2 tablet (4.5 mg) on Mon 06/01/21   Etta Grandchild, MD    Physical Exam:  Vitals:   07/22/21 1945 07/22/21 2000 07/22/21 2015 07/22/21 2020  BP: 132/72 126/83 120/75  Pulse: (!) 103 100 97   Resp: 17 (!) 27 18   Temp:    98.5 F (36.9 C)  TempSrc:    Oral  SpO2: 100% 100% 99%   Weight:      Height:        Constitutional: Awake alert and oriented x3, no associated distress.   Skin: no rashes, no lesions, poor skin turgor noted. Eyes: Pupils are equally reactive to light.  No evidence of scleral icterus or conjunctival pallor.  ENMT: Moist mucous membranes noted.  Posterior pharynx clear of any exudate or lesions.   Neck: normal, supple, no masses, no thyromegaly.  No evidence of jugular venous distension.   Respiratory: clear to auscultation bilaterally, no wheezing, no crackles. Normal respiratory effort. No accessory muscle use.  Cardiovascular: Regular rate and rhythm, no murmurs / rubs / gallops. No extremity edema. 2+ pedal pulses. No carotid bruits.  Chest:   Nontender without crepitus or deformity.   Back:   Nontender without crepitus or deformity. Abdomen: Generalized abdominal tenderness, abdomen is soft however.   No evidence of intra-abdominal masses.  Positive bowel sounds noted in all quadrants.   Musculoskeletal: No joint deformity upper and lower extremities. Good ROM, no contractures. Normal muscle tone.  Neurologic: CN 2-12 grossly intact. Sensation intact.  Patient moving all 4 extremities spontaneously.  Patient is following all commands.  Patient is responsive to verbal stimuli.   Psychiatric: Patient exhibits normal mood with odd affect.  Patient seems to possess insight as to their current situation.    Data Reviewed:  I have personally reviewed and interpreted labs, imaging.  Significant  findings are:  Potassium 4.0  Bicarbonate 21, glucose 149 INR 2.1, PT 23 Urinalysis revealing significant glucosuria with specific gravity of 1.031  EKG: Personally reviewed.  Rhythm is NSR with heart rate of 98BPM.  Intraventricular conduction delay.  No dynamic ST segment changes appreciated.   Assessment and Plan: * Fecal impaction in rectum [facility]) Patient presenting with substantial abdominal pain and several weeks of constipation Likely primarily due to ongoing opiate use with noncompliance with bowel regimen use possibly exacerbated by relative constipation due to ongoing diuretics Patient is already undergone 2 attempts at fecal disimpaction without significant improvement Placing patient on a mineral oil enema with MiraLAX and senna overnight. Encouraging patient to minimize opiate use is much as possible, patient is on a substantial regimen of oxycodone. Once impaction has resolved, patient would likely benefit from initiation of Relistor or Amitiza  Stercoral colitis See assessment and plan above  Chronic combined systolic and diastolic CHF (congestive heart failure) (HCC) No clinical evidence of cardiogenic volume overload Continue home regimen of diuretics including Lasix and spironolactone   Paroxysmal atrial fibrillation (HCC) Currently rate controlled. Continue home regimen of anticoagulation with Coumadin Continue home regimen of rate controlling agent with metoprolol twice daily Monitoring on telemetry   Coronary artery disease involving native coronary artery of native heart with angina pectoris [facility]) Patient is currently chest pain free Monitoring patient on telemetry Continue home regimen of lipid lowering therapy and AV nodal blocking therapy   Type 2 diabetes mellitus without complication, with long-term current use of insulin (HCC) Due to tenuous oral intake placing patient on a somewhat reduced regimen of basal insulin therapy Accu-Cheks before every  meal and nightly with sliding scale insulin Diabetic diet initiated Hemoglobin A1c ordered  Mixed diabetic hyperlipidemia associated with type 2 diabetes mellitus ([place]) Continuing home regimen of lipid lowering therapy.   GERD without  esophagitis Continuing home regimen of daily PPI therapy.        Code Status:  DNR  code status decision has been confirmed with: patient Family Communication: deferred   Consults: None  Severity of Illness:  The appropriate patient status for this patient is OBSERVATION. Observation status is judged to be reasonable and necessary in order to provide the required intensity of service to ensure the patient's safety. The patient's presenting symptoms, physical exam findings, and initial radiographic and laboratory data in the context of their medical condition is felt to place them at decreased risk for further clinical deterioration. Furthermore, it is anticipated that the patient will be medically stable for discharge from the hospital within 2 midnights of admission.   Author:  Vernelle Emerald MD  07/22/2021 9:02 PM

## 2021-07-23 DIAGNOSIS — E876 Hypokalemia: Secondary | ICD-10-CM

## 2021-07-23 DIAGNOSIS — E669 Obesity, unspecified: Secondary | ICD-10-CM

## 2021-07-23 DIAGNOSIS — R300 Dysuria: Secondary | ICD-10-CM

## 2021-07-23 DIAGNOSIS — I480 Paroxysmal atrial fibrillation: Secondary | ICD-10-CM | POA: Diagnosis not present

## 2021-07-23 DIAGNOSIS — E1169 Type 2 diabetes mellitus with other specified complication: Secondary | ICD-10-CM | POA: Diagnosis not present

## 2021-07-23 DIAGNOSIS — K5641 Fecal impaction: Secondary | ICD-10-CM | POA: Diagnosis not present

## 2021-07-23 DIAGNOSIS — R42 Dizziness and giddiness: Secondary | ICD-10-CM

## 2021-07-23 DIAGNOSIS — K5289 Other specified noninfective gastroenteritis and colitis: Secondary | ICD-10-CM | POA: Diagnosis not present

## 2021-07-23 DIAGNOSIS — I25119 Atherosclerotic heart disease of native coronary artery with unspecified angina pectoris: Secondary | ICD-10-CM | POA: Diagnosis not present

## 2021-07-23 DIAGNOSIS — E782 Mixed hyperlipidemia: Secondary | ICD-10-CM | POA: Diagnosis not present

## 2021-07-23 DIAGNOSIS — K219 Gastro-esophageal reflux disease without esophagitis: Secondary | ICD-10-CM | POA: Diagnosis not present

## 2021-07-23 DIAGNOSIS — I5042 Chronic combined systolic (congestive) and diastolic (congestive) heart failure: Secondary | ICD-10-CM | POA: Diagnosis not present

## 2021-07-23 LAB — COMPREHENSIVE METABOLIC PANEL
ALT: 17 U/L (ref 0–44)
AST: 16 U/L (ref 15–41)
Albumin: 3.2 g/dL — ABNORMAL LOW (ref 3.5–5.0)
Alkaline Phosphatase: 49 U/L (ref 38–126)
Anion gap: 10 (ref 5–15)
BUN: 14 mg/dL (ref 8–23)
CO2: 21 mmol/L — ABNORMAL LOW (ref 22–32)
Calcium: 9.2 mg/dL (ref 8.9–10.3)
Chloride: 111 mmol/L (ref 98–111)
Creatinine, Ser: 0.86 mg/dL (ref 0.44–1.00)
GFR, Estimated: >60 mL/min (ref >60)
Glucose, Bld: 117 mg/dL — ABNORMAL HIGH (ref 70–99)
Potassium: 3.4 mmol/L — ABNORMAL LOW (ref 3.5–5.1)
Sodium: 142 mmol/L (ref 135–145)
Total Bilirubin: 1.5 mg/dL — ABNORMAL HIGH (ref 0.3–1.2)
Total Protein: 6.8 g/dL (ref 6.5–8.1)

## 2021-07-23 LAB — CBC WITH DIFFERENTIAL/PLATELET
Abs Immature Granulocytes: 0.02 10*3/uL (ref 0.00–0.07)
Basophils Absolute: 0.1 10*3/uL (ref 0.0–0.1)
Basophils Relative: 1 %
Eosinophils Absolute: 0 10*3/uL (ref 0.0–0.5)
Eosinophils Relative: 0 %
HCT: 38.6 % (ref 36.0–46.0)
Hemoglobin: 12.2 g/dL (ref 12.0–15.0)
Immature Granulocytes: 0 %
Lymphocytes Relative: 22 %
Lymphs Abs: 2.3 10*3/uL (ref 0.7–4.0)
MCH: 26.9 pg (ref 26.0–34.0)
MCHC: 31.6 g/dL (ref 30.0–36.0)
MCV: 85 fL (ref 80.0–100.0)
Monocytes Absolute: 0.9 10*3/uL (ref 0.1–1.0)
Monocytes Relative: 9 %
Neutro Abs: 6.9 10*3/uL (ref 1.7–7.7)
Neutrophils Relative %: 68 %
Platelets: 370 10*3/uL (ref 150–400)
RBC: 4.54 MIL/uL (ref 3.87–5.11)
RDW: 17.6 % — ABNORMAL HIGH (ref 11.5–15.5)
WBC: 10.2 10*3/uL (ref 4.0–10.5)
nRBC: 0 % (ref 0.0–0.2)

## 2021-07-23 LAB — MAGNESIUM: Magnesium: 2.3 mg/dL (ref 1.7–2.4)

## 2021-07-23 LAB — PROTIME-INR
INR: 2.1 — ABNORMAL HIGH (ref 0.8–1.2)
Prothrombin Time: 23.2 seconds — ABNORMAL HIGH (ref 11.4–15.2)

## 2021-07-23 LAB — GLUCOSE, CAPILLARY
Glucose-Capillary: 102 mg/dL — ABNORMAL HIGH (ref 70–99)
Glucose-Capillary: 128 mg/dL — ABNORMAL HIGH (ref 70–99)
Glucose-Capillary: 166 mg/dL — ABNORMAL HIGH (ref 70–99)
Glucose-Capillary: 99 mg/dL (ref 70–99)

## 2021-07-23 LAB — HEMOGLOBIN A1C
Hgb A1c MFr Bld: 7.8 % — ABNORMAL HIGH (ref 4.8–5.6)
Mean Plasma Glucose: 177.16 mg/dL

## 2021-07-23 MED ORDER — PHENAZOPYRIDINE HCL 100 MG PO TABS
100.0000 mg | ORAL_TABLET | Freq: Three times a day (TID) | ORAL | Status: DC
  Administered 2021-07-23 – 2021-07-27 (×11): 100 mg via ORAL
  Filled 2021-07-23 (×12): qty 1

## 2021-07-23 MED ORDER — WARFARIN SODIUM 3 MG PO TABS
3.0000 mg | ORAL_TABLET | Freq: Once | ORAL | Status: AC
  Administered 2021-07-23: 3 mg via ORAL
  Filled 2021-07-23: qty 1

## 2021-07-23 MED ORDER — MUSCLE RUB 10-15 % EX CREA: TOPICAL_CREAM | CUTANEOUS | Status: DC | PRN

## 2021-07-23 MED ORDER — SACUBITRIL-VALSARTAN 49-51 MG PO TABS
1.0000 | ORAL_TABLET | Freq: Two times a day (BID) | ORAL | Status: DC
  Administered 2021-07-23 – 2021-07-27 (×9): 1 via ORAL
  Filled 2021-07-23 (×10): qty 1

## 2021-07-23 MED ORDER — MECLIZINE HCL 25 MG PO TABS
25.0000 mg | ORAL_TABLET | Freq: Three times a day (TID) | ORAL | Status: DC | PRN
  Administered 2021-07-24 – 2021-07-26 (×5): 25 mg via ORAL
  Filled 2021-07-23 (×8): qty 1

## 2021-07-23 NOTE — Progress Notes (Signed)
ANTICOAGULATION CONSULT NOTE   Pharmacy Consult for warfarin Indication: atrial fibrillation  Allergies  Allergen Reactions   Metformin And Related Other (See Comments)    Must take XR form only, cannot tolerate Regular release metformin   Cleocin [Clindamycin Hcl] Diarrhea   Codeine Itching   Doxycycline Hyclate Diarrhea   Macrolides And Ketolides Diarrhea   Morphine Hives   Pentazocine Lactate Itching and Nausea And Vomiting    (GENERIC- [place])   Vibramycin [Doxycycline Calcium] Diarrhea and Rash   Clindamycin Diarrhea   Definity [Perflutren Lipid Microsphere] Other (See Comments)    Patient complained of warm sensation in chest x few seconds duration when injected Definity.No other symptoms   Doxycycline Diarrhea   Metformin Other (See Comments)   Pentazocine Nausea Only   Perflutren Other (See Comments)   Perflutren Lipid Microspheres Other (See Comments)   Sulfa Antibiotics Diarrhea and Rash   Sulfonamide Derivatives Rash    Patient Measurements: Height: '5\' 1"'$  (154.9 cm) Weight: 79.4 kg (175 lb) IBW/kg (Calculated) : 47.8  Vital Signs: Temp: 98.3 F (36.8 C) (05/19 0511) Temp Source: Oral (05/18 2020) BP: 111/73 (05/19 0511) Pulse Rate: 89 (05/19 0511)  Labs: Recent Labs    07/21/21 2254 07/22/21 1513 07/23/21 0235  HGB 13.2 12.5 12.2  HCT 41.9 39.4 38.6  PLT 374 388 370  LABPROT 23.0*  --  23.2*  INR 2.1*  --  2.1*  CREATININE 0.99 0.92 0.86     Estimated Creatinine Clearance: 55.6 mL/min (by C-G formula based on SCr of 0.86 mg/dL).   Medical History: Past Medical History:  Diagnosis Date   Arthritis    Blood transfusion without reported diagnosis    Bursitis    CAD (coronary artery disease)    a. s/p CABG 2004.b. stable cath 2014 demonstrating stable CAD and continued patency of her LIMA graft.   Chronic anticoagulation    on coumadin   DDD (degenerative disc disease), lumbar    Depression    Diabetes mellitus    Diastolic dysfunction     per echo in October 2012 with EF 50 to 55%   Fibromyalgia    GERD (gastroesophageal reflux disease) 10/23/2003   Headache(784.0)    Hyperlipidemia    Hypertension    Hypokalemia    LBBB (left bundle branch block)    Lumbar back pain    LV dysfunction    a. EF 45% by cath 2014. b. EF 50-55% by technically difficult echo in 08/2014.   Lymphadenitis    Morbid obesity ([place])    a. Sleep study negative for significant OSA in 11/2014.   PAF (paroxysmal atrial fibrillation) ([place])    Stroke [facility]) 2004   affected speech per pt    Assessment: 76 yo F here for rectal pain and constipation. On warfarin PTA for afib. PTA regimen 4.5 mg Monday and 3 mg on all other days. Admission INR 2.1.   INR 2.1 - therapeutic  Goal of Therapy:  INR 2-3 Monitor platelets by anticoagulation protocol: Yes   Plan:  Warfarin 3 mg tonight INR daily   Sherlon Handing, PharmD, BCPS Please see amion for complete clinical pharmacist phone list 07/23/2021,7:58 AM

## 2021-07-23 NOTE — Assessment & Plan Note (Addendum)
-  Patient complains of Dizziness -C/w Home Meclizine but she continues to be significantly dizzy may consider head imaging but she thinks that the meclizine helps -vertigo stable and can be followed in the outpatient setting with her neurologist and PCP

## 2021-07-23 NOTE — Hospital Course (Addendum)
HPI per Dr. Inda Merlin on 07/22/21 76 year old female with past medical history of coronary artery disease status post CABG, paroxysmal atrial fibrillation, hyperlipidemia, insulin-dependent diabetes mellitus type 2, systolic and diastolic congestive heart failure (Echo 05/2021 EF 25-30% with G1DD), gastroesophageal reflux disease, remote history of prior stroke and left bundle branch block who presents to [facility] emergency department via EMS with complaints of constipation and rectal pain.   Patient explains that for the past several weeks she has had been experiencing increasingly worsening constipation.  Patient states that when she does move her bowels her bowel movements are very small.  As the patient's symptoms have worsened patient is developed progressively worsening lower abdominal and rectal pain.  Patient describes his pain as sore in quality, worse with movement and quite severe with attempts at moving her bowels.  Patient denies any associated fevers, sick contacts, recent travel.   Patient symptoms continue to worsen and she eventually presented to [facility] emergency department the evening of 5/17.  At that time patient underwent fecal disimpaction by the emergency department provider and was discharged home on oral senna and docusate.   In the following 24 hours patient continued to have worsening lower abdominal pain that became more and more severe.  This came associated with low back pain and a sensation of dysuria with bouts of urinary incontinence.  Due to persisting symptoms the patient presented again to [facility] emergency permit via EMS for evaluation.   Upon evaluation in the emergency department CT imaging of the abdomen and pelvis were performed revealing no evidence of perforation but identifying distention of the rectum with rectal wall thickening and enhancement consistent with stercoral colitis.  A repeat attempt at disimpaction was  performed followed by administration of a smog enema.  Due to minimal improvement with these interventions and ongoing substantial symptoms ER providers requesting hospitalist group evaluate patient for admission to the hospital.  **Interim History  She was having 2 liquidy bowel movements now and still complaining of some abdominal discomfort.  Now her bowel movements have stopped and she is concerned about constipation again given difficulty going again so we will give her another enema.  A smog enema was ordered however pharmacy informing that they are on backorder so we will give her milk of molasses enema this was ordered yesterday to be given but was not given last night this patient refuses will be given today.  Her main concern is now burning discomfort in her urine.  Urinalysis was obtained and urine culture still pending and showed multiple species so we will recollect. Will obtain PT/OT to evaluate and treat and recommending home health again  She continues to be somewhat dizzy and we will continue her on meclizine but if she continues to be further dizzy will further work-up with imaging.  She is also complaining about some mild dyspnea and right foot pain so we will get chest x-rays of both.  Chest x-ray showed no active disease foot x-ray showed "Mild hallux valgus deformity with associated mild first MTP joint osteoarthritis."  Dyspnea is improved and she is feeling better.  Having looser bowel movements and stable for discharge.  She will follow-up with her PCP and home health is being arranged.  She will have PT OT to follow at discharge and she will need to follow-up with her regular doctor and cardiologist within the outpatient setting in 1 to 2 weeks.

## 2021-07-23 NOTE — Assessment & Plan Note (Addendum)
-   Seen urinalysis initially hazy appearance with greater than 500 glucose, moderate hemoglobin, 5 ketones, negative leukocytes, negative nitrites and rare bacteria with 6-10 RBCs per high-power field; urine culture showing multiple species so will recollect  -Recollection has been ordered but not obtained yet

## 2021-07-23 NOTE — Progress Notes (Signed)
PROGRESS NOTE    Kristen [name]  VPX:[id number] DOB: 1946/02/08 DOA: 07/22/2021 PCP: [name], MD   Brief Narrative:  HPI per Dr. Inda Merlin on 07/22/21 76 year old female with past medical history of coronary artery disease status post CABG, paroxysmal atrial fibrillation, hyperlipidemia, insulin-dependent diabetes mellitus type 2, systolic and diastolic congestive heart failure (Echo 05/2021 EF 25-30% with G1DD), gastroesophageal reflux disease, remote history of prior stroke and left bundle branch block who presents to [facility] emergency department via EMS with complaints of constipation and rectal pain.   Patient explains that for the past several weeks she has had been experiencing increasingly worsening constipation.  Patient states that when she does move her bowels her bowel movements are very small.  As the patient's symptoms have worsened patient is developed progressively worsening lower abdominal and rectal pain.  Patient describes his pain as sore in quality, worse with movement and quite severe with attempts at moving her bowels.  Patient denies any associated fevers, sick contacts, recent travel.   Patient symptoms continue to worsen and she eventually presented to [facility] emergency department the evening of 5/17.  At that time patient underwent fecal disimpaction by the emergency department provider and was discharged home on oral senna and docusate.   In the following 24 hours patient continued to have worsening lower abdominal pain that became more and more severe.  This came associated with low back pain and a sensation of dysuria with bouts of urinary incontinence.  Due to persisting symptoms the patient presented again to [facility] emergency permit via EMS for evaluation.   Upon evaluation in the emergency department CT imaging of the abdomen and pelvis were performed revealing no evidence of perforation but identifying distention  of the rectum with rectal wall thickening and enhancement consistent with stercoral colitis.  A repeat attempt at disimpaction was performed followed by administration of a smog enema.  Due to minimal improvement with these interventions and ongoing substantial symptoms ER providers requesting hospitalist group evaluate patient for admission to the hospital.  **Interim History  Is having 2 liquidy bowel movements now and still complaining of some abdominal discomfort.  Her main concern is now burning discomfort in her urine.  Urinalysis was obtained and urine culture still pending. Will obtain PT/OT to evaluate and treat.     Assessment and Plan: * Fecal impaction in rectum [facility]) Patient presenting with substantial abdominal pain and several weeks of constipation Likely primarily due to ongoing opiate use with noncompliance with bowel regimen use possibly exacerbated by relative constipation due to ongoing diuretics Patient is already undergone 2 attempts at fecal disimpaction without significant improvement Was given a mineral oil enema with MiraLAX and Senna-Docusate overnight. Encouraging patient to minimize opiate use is much as possible, patient is on a substantial regimen of oxycodone. Once impaction has resolved, patient would likely benefit from initiation of Relistor or Amitiza If not improving further will consider Gastroenterology Evaluation   Stercoral colitis See assessment and plan above  Chronic combined systolic and diastolic CHF (congestive heart failure) (HCC) No clinical evidence of cardiogenic volume overload Continue home regimen of diuretics including Furosemide 10 mg po Daily and Spironolactone 25 mg po daily and Sacubitril-Valsartan 49-51 1 tab po BID    Paroxysmal atrial fibrillation (HCC) Currently rate controlled. Continue home regimen of anticoagulation with Coumadin with Pharmacy  Continue home regimen of rate controlling agent with metoprolol twice  daily Continue Monitoring on Telemetry   Coronary  artery disease involving native coronary artery of native heart with angina pectoris [facility]) Patient is currently chest pain free Continue Monitoring patient on telemetry Continue home regimen of lipid lowering therapy and AV nodal blocking therapy   Type 2 diabetes mellitus without complication, with long-term current use of insulin (HCC) Due to tenuous oral intake placing patient on a somewhat reduced regimen of basal insulin therapy C/w Accu-Cheks before every meal and nightly with sliding scale insulin Diabetic diet initiated Hemoglobin A1c ordered and is now 7.8 CBG's ranging from 102-166  Mixed diabetic hyperlipidemia associated with type 2 diabetes mellitus ([place]) Continuing home regimen of lipid lowering therapy with Atorvastatin 40 mg po qHS   GERD without esophagitis Continuing home regimen of daily PPI therapy Pantoprazole 40 mg po daily.   Vertigo -Patient complains of Dizziness -C/w Home Meclizine  Burning with urination - Seen urinalysis initially hazy appearance with greater than 500 glucose, moderate hemoglobin, 5 ketones, negative leukocytes, negative nitrites and rare bacteria with 6-10 RBCs per high-power field; urine culture still pending  Hyperbilirubinemia -Patient's T Bili is now 1.5 -Continue to Monitor and Replete as Necessary -Repeat CMP in the AM   Hypokalemia -Patient's K+ was 3.4 -Replete with po KCl 40 mEQ BID x2 -Continue to Monitor and Replete as Necessary -Repeat CMP in the AM   Obesity with body mass index (BMI) of 21.1 to 94.1 -Complicates overall prognosis and care -Estimated body mass index is 33.07 kg/m as calculated from the following:   Height as of this encounter: '5\' 1"'$  (1.549 m).   Weight as of this encounter: 79.4 kg.  -Weight Loss and Dietary Counseling given   DVT prophylaxis: Anticoagulated with Warfarin    Code Status: DNR Family Communication: No family present at  bedside   Disposition Plan:  Level of care: Telemetry Medical Status is: Observation The patient will require care spanning > 2 midnights and should be moved to inpatient because: Needs PT/OT to evaluate and improvement with Bowels   Consultants:  None  Procedures:  None  Antimicrobials:  Anti-infectives (From admission, onward)    None       Subjective: Seen and examined at bedside and was having some abdominal discomfort still and states that she is having some burning and discomfort with her urination.  Also complaining about some dizziness.  No nausea or vomiting.  Denies any other concerns or complaints at this time.  Objective: Vitals:   07/22/21 2117 07/22/21 2243 07/23/21 0511 07/23/21 0932  BP: 116/66 123/70 111/73 120/63  Pulse: 99 99 89 83  Resp: '19 18 19 16  '$ Temp: 98.3 F (36.8 C) 99 F (37.2 C) 98.3 F (36.8 C) 98.5 F (36.9 C)  TempSrc:      SpO2: 100% 100% 99% 95%  Weight:      Height:        Intake/Output Summary (Last 24 hours) at 07/23/2021 1732 Last data filed at 07/23/2021 1300 Gross per 24 hour  Intake 480 ml  Output 254 ml  Net 226 ml   Filed Weights   07/22/21 1338  Weight: 79.4 kg   Examination: Physical Exam:  Constitutional: WN/WD obese AAF in NAD Respiratory: Diminished to auscultation bilaterally with coarse breath sounds, no wheezing, rales, rhonchi or crackles. Normal respiratory effort and patient is not tachypenic. No accessory muscle use. Unlabored breathing  Cardiovascular: RRR, no murmurs / rubs / gallops. S1 and S2 auscultated. Trace edema  Abdomen: Soft, non-tender, Distended 2/2 body habitus. Bowel sounds positive.  GU:  Deferred. Musculoskeletal: No clubbing / cyanosis of digits/nails.  Neurologic: CN 2-12 grossly intact with no focal deficits. Psychiatric: Normal judgment and insight. Alert and oriented x 3. Normal mood and appropriate affect.   Data Reviewed: I have personally reviewed following labs and imaging  studies  CBC: Recent Labs  Lab 07/21/21 2254 07/22/21 1513 07/23/21 0235  WBC 8.3 8.4 10.2  NEUTROABS  --   --  6.9  HGB 13.2 12.5 12.2  HCT 41.9 39.4 38.6  MCV 87.5 85.7 85.0  PLT 374 388 573   Basic Metabolic Panel: Recent Labs  Lab 07/21/21 2254 07/22/21 1513 07/23/21 0235  NA 138 141 142  K 3.2* 4.0 3.4*  CL 111 111 111  CO2 19* 21* 21*  GLUCOSE 188* 149* 117*  BUN '13 13 14  '$ CREATININE 0.99 0.92 0.86  CALCIUM 9.8 9.5 9.2  MG  --   --  2.3   GFR: Estimated Creatinine Clearance: 55.6 mL/min (by C-G formula based on SCr of 0.86 mg/dL). Liver Function Tests: Recent Labs  Lab 07/21/21 2254 07/23/21 0235  AST 20 16  ALT 18 17  ALKPHOS 53 49  BILITOT 1.0 1.5*  PROT 7.2 6.8  ALBUMIN 3.6 3.2*   Recent Labs  Lab 07/21/21 2254  LIPASE 23   No results for input(s): AMMONIA in the last 168 hours. Coagulation Profile: Recent Labs  Lab 07/21/21 2254 07/23/21 0235  INR 2.1* 2.1*   Cardiac Enzymes: No results for input(s): CKTOTAL, CKMB, CKMBINDEX, TROPONINI in the last 168 hours. BNP (last 3 results) No results for input(s): PROBNP in the last 8760 hours. HbA1C: Recent Labs    07/23/21 0235  HGBA1C 7.8*   CBG: Recent Labs  Lab 07/22/21 2155 07/23/21 0928 07/23/21 1237 07/23/21 1700  GLUCAP 164* 102* 128* 166*   Lipid Profile: No results for input(s): CHOL, HDL, LDLCALC, TRIG, CHOLHDL, LDLDIRECT in the last 72 hours. Thyroid Function Tests: No results for input(s): TSH, T4TOTAL, FREET4, T3FREE, THYROIDAB in the last 72 hours. Anemia Panel: No results for input(s): VITAMINB12, FOLATE, FERRITIN, TIBC, IRON, RETICCTPCT in the last 72 hours. Sepsis Labs: No results for input(s): PROCALCITON, LATICACIDVEN in the last 168 hours.  No results found for this or any previous visit (from the past 240 hour(s)).   Radiology Studies: CT ABDOMEN PELVIS W CONTRAST  Result Date: 07/22/2021 CLINICAL DATA:  Acute abdominal pain, neutropenic, concern for  proctitis. EXAM: CT ABDOMEN AND PELVIS WITH CONTRAST TECHNIQUE: Multidetector CT imaging of the abdomen and pelvis was performed using the standard protocol following bolus administration of intravenous contrast. RADIATION DOSE REDUCTION: This exam was performed according to the departmental dose-optimization program which includes automated exposure control, adjustment of the mA and/or kV according to patient size and/or use of iterative reconstruction technique. CONTRAST:  168m OMNIPAQUE IOHEXOL 300 MG/ML  SOLN COMPARISON:  CT abdomen pelvis dated 07/05/2021. FINDINGS: Lower chest: There is mild atelectasis/scarring in the lingula and left lower lobe. Hepatobiliary: No focal liver abnormality is seen. Status post cholecystectomy. No biliary dilatation. Pancreas: Unremarkable. No pancreatic ductal dilatation or surrounding inflammatory changes. Spleen: Normal in size without focal abnormality. Adrenals/Urinary Tract: No suspicious finding in either adrenal gland. A benign 13 mm cyst in the left kidney is noted. No further imaging follow-up is recommended. Otherwise, the kidneys are normal, without renal calculi, focal lesion, or hydronephrosis. Bladder is unremarkable. Stomach/Bowel: The stomach appears normal. The rectum is distended with feces in there is associated rectal wall thickening/enhancement, most consistent with stercoral colitis.  The appendix is normal. No evidence of bowel perforation. There is no bowel obstruction. Vascular/Lymphatic: Aortic atherosclerosis. No enlarged abdominal or pelvic lymph nodes. Reproductive: Status post hysterectomy. No adnexal masses. Other: No abdominal wall hernia or abnormality. No abdominopelvic ascites. Musculoskeletal: Degenerative changes are seen in the spine. IMPRESSION: Findings most consistent with stercoral colitis. Electronically Signed   By: Zerita Boers M.D.   On: 07/22/2021 18:20    Scheduled Meds:  ALPRAZolam  0.5 mg Oral BID   ALPRAZolam  1 mg Oral  Q0600   atorvastatin  40 mg Oral QHS   furosemide  10 mg Oral q morning   gabapentin  300 mg Oral TID   insulin aspart  0-15 Units Subcutaneous TID AC & HS   insulin glargine-yfgn  24 Units Subcutaneous QHS   metoprolol succinate  100 mg Oral BID   pantoprazole  40 mg Oral Daily   phenazopyridine  100 mg Oral TID WC   polyethylene glycol  17 g Oral BID   sacubitril-valsartan  1 tablet Oral BID   senna-docusate  2 tablet Oral QHS   sodium chloride flush  3 mL Intravenous Q12H   spironolactone  25 mg Oral Daily   thiamine  100 mg Oral Daily   Warfarin - Pharmacist Dosing Inpatient   Does not apply q1600   Continuous Infusions:   LOS: 0 days   Raiford Noble, DO [facility] Available via Epic secure chat 7am-7pm After these hours, please refer to coverage provider listed on [email or web address] 07/23/2021, 5:32 PM

## 2021-07-23 NOTE — Assessment & Plan Note (Signed)
-  Complicates overall prognosis and care -Estimated body mass index is 33.07 kg/m as calculated from the following:   Height as of this encounter: '5\' 1"'$  (1.549 m).   Weight as of this encounter: 79.4 kg.  -Weight Loss and Dietary Counseling given

## 2021-07-23 NOTE — Assessment & Plan Note (Addendum)
-  Patient's K+ is now improved at 4.4 -Continue to Monitor and Replete as Necessary -Repeat CMP in the AM

## 2021-07-23 NOTE — Care Management Obs Status (Signed)
[place] NOTIFICATION   Patient Details  Name: Kristen [name] MRN: [id number] Date of Birth: Oct 09, 1945   Medicare Observation Status Notification Given:  Yes    Marilu Favre, RN 07/23/2021, 1:58 PM

## 2021-07-23 NOTE — Assessment & Plan Note (Addendum)
-  Patient's T Bili had peaked at 1.5 and is now 0.6 -Continue to Monitor and Replete as Necessary -Repeat CMP in the AM

## 2021-07-23 NOTE — TOC Initial Note (Signed)
Transition of Care [facility]) - Initial/Assessment Note    Patient Details  Name: Kristen [name] MRN: [id number] Date of Birth: Jul 05, 1945  Transition of Care [facility]) CM/SW Contact:    Marilu Favre, RN Phone Number: 07/23/2021, 1:56 PM  Clinical Narrative:                 Spoke with patient at bedside . Confirmed face sheet information. Patient from home with husband . Active with PCP Scarlette Calico .   Has prescription coverage. Can afford medications .Has transportation to appointments;   Expected Discharge Plan: Home/Self Care Barriers to Discharge: Continued Medical Work up   Patient Goals and CMS Choice Patient states their goals for this hospitalization and ongoing recovery are:: to return to home CMS Medicare.gov Compare Post Acute Care list provided to:: Patient    Expected Discharge Plan and Services Expected Discharge Plan: Home/Self Care     Post Acute Care Choice: NA Living arrangements for the past 2 months: Apartment                 DME Arranged: N/A DME Agency: NA       HH Arranged: NA          Prior Living Arrangements/Services Living arrangements for the past 2 months: Apartment Lives with:: Spouse Patient language and need for interpreter reviewed:: Yes Do you feel safe going back to the place where you live?: Yes      Need for Family Participation in Patient Care: Yes (Comment) Care giver support system in place?: Yes (comment) Current home services: DME Criminal Activity/Legal Involvement Pertinent to Current Situation/Hospitalization: No - Comment as needed  Activities of Daily Living      Permission Sought/Granted   Permission granted to share information with : No              Emotional Assessment Appearance:: Appears stated age Attitude/Demeanor/Rapport: Engaged Affect (typically observed): Accepting Orientation: : Oriented to Self, Oriented to Place, Oriented to  Time, Oriented to Situation Alcohol / Substance Use: Not  Applicable Psych Involvement: No (comment)  Admission diagnosis:  Rectal pain [K62.89] Constipation, unspecified constipation type [K59.00] Patient Active Problem List   Diagnosis Date Noted   Stercoral colitis 07/22/2021   Fecal impaction in rectum ([place]) 07/22/2021   Weakness of extremity 07/07/2021   Delusions of parasitosis ([place]) 07/07/2021   Severe episode of recurrent major depressive disorder, with psychotic features ([place]) 06/15/2021   GAD (generalized anxiety disorder) 06/02/2021   Stage 3a chronic kidney disease ([place]) 03/17/2021   PAF (paroxysmal atrial fibrillation) (HCC)    Thiamine deficiency 12/30/2018   Vitamin B12 deficiency anemia due to intrinsic factor deficiency 12/20/2018   Chronic combined systolic and diastolic CHF (congestive heart failure) ([place]) 12/19/2018   Coronary artery disease involving native coronary artery of native heart with angina pectoris ([place]) 12/19/2018   Obesity with body mass index (BMI) of 30.0 to 39.9 01/15/2018   Chronic idiopathic constipation 12/27/2017   Type 2 diabetes mellitus without complication, with long-term current use of insulin ([place]) 10/30/2017   Eczema 08/07/2017   Insomnia secondary to chronic pain 08/04/2016   Visit for screening mammogram 04/14/2016   LV dysfunction    Routine general medical examination at a health care facility 05/03/2013   DM (diabetes mellitus), type 2 with peripheral vascular complications ([place]) 15/17/6160   PVC's/nonsustained VT 10/24/2012   H/O: CVA (cerebrovascular accident) 02/09/2012   Hypothyroidism 05/27/2011   Fibromyalgia 10/08/2010   Long term current use of  anticoagulant 05/05/2010   Low back pain, non-specific 05/12/2008   Cognitive dysfunction associated with depression 01/29/2007   Osteoarthritis 11/22/2006   Mixed diabetic hyperlipidemia associated with type 2 diabetes mellitus ([place]) 09/13/2006   Coronary atherosclerosis 09/13/2006   Paroxysmal atrial fibrillation ([place]) 09/13/2006    GERD without esophagitis 09/13/2006   Hypertension associated with diabetes ([place]) 08/30/2006   PCP:  [name], MD Pharmacy:   [facility] - [place], [place] - [address] [place] [address] Phone: [phone] Fax: [phone]  [facility] [place], [address] [place] [address] Phone: [phone] Fax: [phone]  [facility], [place] - [address] [place] [address] Phone: [phone] Fax: [phone]     Social Determinants of Health ([place]) Interventions    Readmission Risk Interventions     View : No data to display.

## 2021-07-24 ENCOUNTER — Observation Stay (HOSPITAL_COMMUNITY): Payer: Medicare Other

## 2021-07-24 DIAGNOSIS — K5641 Fecal impaction: Secondary | ICD-10-CM | POA: Diagnosis not present

## 2021-07-24 DIAGNOSIS — K5900 Constipation, unspecified: Secondary | ICD-10-CM | POA: Diagnosis not present

## 2021-07-24 DIAGNOSIS — I5042 Chronic combined systolic (congestive) and diastolic (congestive) heart failure: Secondary | ICD-10-CM | POA: Diagnosis not present

## 2021-07-24 DIAGNOSIS — R300 Dysuria: Secondary | ICD-10-CM | POA: Diagnosis not present

## 2021-07-24 DIAGNOSIS — I25119 Atherosclerotic heart disease of native coronary artery with unspecified angina pectoris: Secondary | ICD-10-CM | POA: Diagnosis not present

## 2021-07-24 LAB — URINE CULTURE

## 2021-07-24 LAB — MAGNESIUM: Magnesium: 2.1 mg/dL (ref 1.7–2.4)

## 2021-07-24 LAB — GLUCOSE, CAPILLARY
Glucose-Capillary: 108 mg/dL — ABNORMAL HIGH (ref 70–99)
Glucose-Capillary: 155 mg/dL — ABNORMAL HIGH (ref 70–99)
Glucose-Capillary: 130 mg/dL — ABNORMAL HIGH (ref 70–99)
Glucose-Capillary: 175 mg/dL — ABNORMAL HIGH (ref 70–99)

## 2021-07-24 LAB — COMPREHENSIVE METABOLIC PANEL
ALT: 55 U/L — ABNORMAL HIGH (ref 0–44)
AST: 79 U/L — ABNORMAL HIGH (ref 15–41)
Albumin: 3.1 g/dL — ABNORMAL LOW (ref 3.5–5.0)
Alkaline Phosphatase: 43 U/L (ref 38–126)
Anion gap: 9 (ref 5–15)
BUN: 16 mg/dL (ref 8–23)
CO2: 23 mmol/L (ref 22–32)
Calcium: 9.1 mg/dL (ref 8.9–10.3)
Chloride: 107 mmol/L (ref 98–111)
Creatinine, Ser: 0.92 mg/dL (ref 0.44–1.00)
GFR, Estimated: >60 mL/min (ref >60)
Glucose, Bld: 137 mg/dL — ABNORMAL HIGH (ref 70–99)
Potassium: 3 mmol/L — ABNORMAL LOW (ref 3.5–5.1)
Sodium: 139 mmol/L (ref 135–145)
Total Bilirubin: 0.8 mg/dL (ref 0.3–1.2)
Total Protein: 6.6 g/dL (ref 6.5–8.1)

## 2021-07-24 LAB — CBC WITH DIFFERENTIAL/PLATELET
Abs Immature Granulocytes: 0.01 10*3/uL (ref 0.00–0.07)
Basophils Absolute: 0.1 10*3/uL (ref 0.0–0.1)
Basophils Relative: 1 %
Eosinophils Absolute: 0.2 10*3/uL (ref 0.0–0.5)
Eosinophils Relative: 2 %
HCT: 37.7 % (ref 36.0–46.0)
Hemoglobin: 12.4 g/dL (ref 12.0–15.0)
Immature Granulocytes: 0 %
Lymphocytes Relative: 41 %
Lymphs Abs: 2.9 10*3/uL (ref 0.7–4.0)
MCH: 27.6 pg (ref 26.0–34.0)
MCHC: 32.9 g/dL (ref 30.0–36.0)
MCV: 84 fL (ref 80.0–100.0)
Monocytes Absolute: 0.8 10*3/uL (ref 0.1–1.0)
Monocytes Relative: 11 %
Neutro Abs: 3.2 10*3/uL (ref 1.7–7.7)
Neutrophils Relative %: 45 %
Platelets: 335 10*3/uL (ref 150–400)
RBC: 4.49 MIL/uL (ref 3.87–5.11)
RDW: 17.3 % — ABNORMAL HIGH (ref 11.5–15.5)
WBC: 7.1 10*3/uL (ref 4.0–10.5)
nRBC: 0 % (ref 0.0–0.2)

## 2021-07-24 LAB — PROTIME-INR
INR: 2.3 — ABNORMAL HIGH (ref 0.8–1.2)
Prothrombin Time: 25.1 seconds — ABNORMAL HIGH (ref 11.4–15.2)

## 2021-07-24 LAB — PHOSPHORUS: Phosphorus: 3.7 mg/dL (ref 2.5–4.6)

## 2021-07-24 MED ORDER — BISACODYL 10 MG RE SUPP: 10.0000 mg | Freq: Every day | RECTAL | Status: DC | PRN

## 2021-07-24 MED ORDER — SENNOSIDES-DOCUSATE SODIUM 8.6-50 MG PO TABS
1.0000 | ORAL_TABLET | Freq: Two times a day (BID) | ORAL | Status: DC
  Administered 2021-07-24 – 2021-07-27 (×6): 1 via ORAL
  Filled 2021-07-24 (×6): qty 1

## 2021-07-24 MED ORDER — FLEET ENEMA 7-19 GM/118ML RE ENEM: 1.0000 | ENEMA | Freq: Once | RECTAL | Status: DC

## 2021-07-24 MED ORDER — POTASSIUM CHLORIDE CRYS ER 20 MEQ PO TBCR
40.0000 meq | EXTENDED_RELEASE_TABLET | Freq: Two times a day (BID) | ORAL | Status: DC
  Administered 2021-07-24 – 2021-07-27 (×7): 40 meq via ORAL
  Filled 2021-07-24 (×7): qty 2

## 2021-07-24 MED ORDER — WARFARIN SODIUM 3 MG PO TABS
3.0000 mg | ORAL_TABLET | Freq: Once | ORAL | Status: AC
  Administered 2021-07-24: 3 mg via ORAL
  Filled 2021-07-24: qty 1

## 2021-07-24 MED ORDER — DICLOFENAC SODIUM 1 % EX GEL
2.0000 g | Freq: Four times a day (QID) | CUTANEOUS | Status: DC
  Administered 2021-07-24 – 2021-07-27 (×12): 2 g via TOPICAL
  Filled 2021-07-24: qty 100

## 2021-07-24 NOTE — Progress Notes (Signed)
Completed bladder scan and the range was from 27-121 cc.

## 2021-07-24 NOTE — Evaluation (Signed)
Physical Therapy Evaluation Patient Details Name: Kristen [name] MRN: [id number] DOB: [birth year] Today's Date: 07/24/2021  History of Present Illness  76 y.o. female presents to [facility] hospital on 07/22/2021 with complaints of constipation and rectal pain. CT abdomen reveals distention of the rectum with rectal wall thickening and enhancement consistent with stercoral colitis. PMH includes CAD s/p CABG, PAF, HLD, DMII, CHF, GERD, CVA.  Clinical Impression  Pt presents to PT with deficits in functional mobility, gait, balance, endurance, power. Pt reports being limited by pain as well as fatigue, citing reduced appetite and PO intake over the last week. Pt is able to ambulate for limited household distances at this time. PT provides education on the need for frequent mobilization in an effort to resolve constipation and to improve endurance. PT recommends discharge home with HHPT and a BSC pending continued improvement in activity tolerance.     Recommendations for follow up therapy are one component of a multi-disciplinary discharge planning process, led by the attending physician.  Recommendations may be updated based on patient status, additional functional criteria and insurance authorization.  Follow Up Recommendations Home health PT    Assistance Recommended at Discharge Intermittent Supervision/Assistance  Patient can return home with the following  A little help with walking and/or transfers;A little help with bathing/dressing/bathroom;Assistance with cooking/housework;Assist for transportation;Help with stairs or ramp for entrance    Equipment Recommendations BSC/3in1  Recommendations for Other Services       Functional Status Assessment Patient has had a recent decline in their functional status and demonstrates the ability to make significant improvements in function in a reasonable and predictable amount of time.     Precautions / Restrictions Precautions Precautions:  Fall Restrictions Weight Bearing Restrictions: No      Mobility  Bed Mobility Overal bed mobility: Needs Assistance Bed Mobility: Supine to Sit     Supine to sit: Supervision     General bed mobility comments: increased time    Transfers Overall transfer level: Needs assistance Equipment used: Rolling walker (2 wheels), 1 person hand held assist Transfers: Sit to/from Stand Sit to Stand: Min assist, Min guard           General transfer comment: minA with hand hold, minG with support of walker for 2 trials    Ambulation/Gait Ambulation/Gait assistance: Min guard Gait Distance (Feet): 15 Feet (x 2 trials) Assistive device: Rolling walker (2 wheels) Gait Pattern/deviations: Step-to pattern Gait velocity: reduced Gait velocity interpretation: <1.31 ft/sec, indicative of household ambulator   General Gait Details: pt with slowed step-to gait, reporting pain in R foot with ambulation  Stairs            Wheelchair Mobility    Modified Rankin (Stroke Patients Only)       Balance Overall balance assessment: Needs assistance Sitting-balance support: No upper extremity supported, Feet supported Sitting balance-Leahy Scale: Good Sitting balance - Comments: pt bending to don sock at edge of bed   Standing balance support: Bilateral upper extremity supported, Reliant on assistive device for balance Standing balance-Leahy Scale: Poor                               Pertinent Vitals/Pain Pain Assessment Pain Assessment: 0-10 Pain Score: 5  Pain Location: bladder Pain Descriptors / Indicators: Burning Pain Intervention(s): Limited activity within patient's tolerance, Monitored during session    Home Living Family/patient expects to be discharged to:: Private residence Living Arrangements: Spouse/significant other  Available Help at Discharge: Family;Available 24 hours/day Type of Home: Apartment Home Access: Level entry       Home Layout: One  level Home Equipment: Cane - single point;Rollator (4 wheels);Shower seat      Prior Function Prior Level of Function : Independent/Modified Independent             Mobility Comments: PRN use of cane, especially for longer distances       Hand Dominance        Extremity/Trunk Assessment   Upper Extremity Assessment Upper Extremity Assessment: Generalized weakness    Lower Extremity Assessment Lower Extremity Assessment: Generalized weakness    Cervical / Trunk Assessment Cervical / Trunk Assessment: Kyphotic  Communication   Communication: No difficulties  Cognition Arousal/Alertness: Awake/alert Behavior During Therapy: WFL for tasks assessed/performed Overall Cognitive Status: Within Functional Limits for tasks assessed                                          General Comments General comments (skin integrity, edema, etc.): VSS on RA    Exercises     Assessment/Plan    PT Assessment Patient needs continued PT services  PT Problem List Decreased strength;Decreased balance;Decreased activity tolerance;Decreased mobility;Pain       PT Treatment Interventions DME instruction;Gait training;Functional mobility training;Therapeutic activities;Therapeutic exercise;Neuromuscular re-education;Balance training;Patient/family education    PT Goals (Current goals can be found in the Care Plan section)  Acute Rehab PT Goals Patient Stated Goal: to reduce pain and improve strength, return to independence PT Goal Formulation: With patient/family Time For Goal Achievement: 08/07/21 Potential to Achieve Goals: Good    Frequency Min 3X/week     Co-evaluation               AM-PAC PT "6 Clicks" Mobility  Outcome Measure Help needed turning from your back to your side while in a flat bed without using bedrails?: A Little Help needed moving from lying on your back to sitting on the side of a flat bed without using bedrails?: A Little Help  needed moving to and from a bed to a chair (including a wheelchair)?: A Little Help needed standing up from a chair using your arms (e.g., wheelchair or bedside chair)?: A Little Help needed to walk in hospital room?: Total Help needed climbing 3-5 steps with a railing? : Total 6 Click Score: 14    End of Session   Activity Tolerance: Patient limited by fatigue;Patient limited by pain Patient left: in bed;with call bell/phone within reach;with bed alarm set;with family/visitor present Nurse Communication: Mobility status PT Visit Diagnosis: Other abnormalities of gait and mobility (R26.89);Muscle weakness (generalized) (M62.81);Pain Pain - part of body:  (rectum, bladder, RLE)    Time: 2458-0998 PT Time Calculation (min) (ACUTE ONLY): 36 min   Charges:   PT Evaluation $PT Eval Low Complexity: 1 Low          Zenaida Niece, PT, DPT Acute Rehabilitation Pager: [phone] Office [phone]   Zenaida Niece 07/24/2021, 4:59 PM

## 2021-07-24 NOTE — Progress Notes (Signed)
PROGRESS NOTE    Kristen [name]  ZHG:[id number] DOB: [birth year] DOA: 07/22/2021 PCP: [name], MD   Brief Narrative:  HPI per Dr. Inda Merlin on 07/22/21 76 year old female with past medical history of coronary artery disease status post CABG, paroxysmal atrial fibrillation, hyperlipidemia, insulin-dependent diabetes mellitus type 2, systolic and diastolic congestive heart failure (Echo 05/2021 EF 25-30% with G1DD), gastroesophageal reflux disease, remote history of prior stroke and left bundle branch block who presents to [facility] emergency department via EMS with complaints of constipation and rectal pain.   Patient explains that for the past several weeks she has had been experiencing increasingly worsening constipation.  Patient states that when she does move her bowels her bowel movements are very small.  As the patient's symptoms have worsened patient is developed progressively worsening lower abdominal and rectal pain.  Patient describes his pain as sore in quality, worse with movement and quite severe with attempts at moving her bowels.  Patient denies any associated fevers, sick contacts, recent travel.   Patient symptoms continue to worsen and she eventually presented to [facility] emergency department the evening of 5/17.  At that time patient underwent fecal disimpaction by the emergency department provider and was discharged home on oral senna and docusate.   In the following 24 hours patient continued to have worsening lower abdominal pain that became more and more severe.  This came associated with low back pain and a sensation of dysuria with bouts of urinary incontinence.  Due to persisting symptoms the patient presented again to [facility] emergency permit via EMS for evaluation.   Upon evaluation in the emergency department CT imaging of the abdomen and pelvis were performed revealing no evidence of perforation but identifying distention  of the rectum with rectal wall thickening and enhancement consistent with stercoral colitis.  A repeat attempt at disimpaction was performed followed by administration of a smog enema.  Due to minimal improvement with these interventions and ongoing substantial symptoms ER providers requesting hospitalist group evaluate patient for admission to the hospital.  **Interim History  Is having 2 liquidy bowel movements now and still complaining of some abdominal discomfort.  Her main concern is now burning discomfort in her urine.  Urinalysis was obtained and urine culture still pending. Will obtain PT/OT to evaluate and treat.    Assessment and Plan: * Fecal impaction in rectum [facility]) Patient presenting with substantial abdominal pain and several weeks of constipation Likely primarily due to ongoing opiate use with noncompliance with bowel regimen use possibly exacerbated by relative constipation due to ongoing diuretics Patient is already undergone 2 attempts at fecal disimpaction without significant improvement Was given a mineral oil enema with MiraLAX and Senna-Docusate overnight. Now will get a Fleet Enema  Encouraging patient to minimize opiate use is much as possible, patient is on a substantial regimen of oxycodone. Once impaction has resolved, patient would likely benefit from initiation of Relistor or Amitiza If not improving further will consider Gastroenterology Evaluation  -Repeat KUB this afternoon showed "Bowel gas pattern is nonspecific. Small amount of stool is seen in the colon. There is no demonstrable fecal impaction in the rectum. Surgical clips are seen in the right upper quadrant. No abnormal masses or calcifications are seen in the abdomen. Kidneys are partly obscured by bowel contents. There is contrast in the urinary bladder from previous CT. Phleboliths are seen in the pelvis."  Stercoral colitis See assessment and plan above  Chronic combined systolic and  diastolic CHF  (congestive heart failure) (HCC) No clinical evidence of cardiogenic volume overload Continue home regimen of diuretics including Furosemide 10 mg po Daily and Spironolactone 25 mg po daily and Sacubitril-Valsartan 49-51 1 tab po BID    Paroxysmal atrial fibrillation (HCC) Currently rate controlled. Continue home regimen of anticoagulation with Coumadin with Pharmacy  Continue home regimen of rate controlling agent with metoprolol twice daily Continue Monitoring on Telemetry   Coronary artery disease involving native coronary artery of native heart with angina pectoris [facility]) Patient is currently chest pain free Continue Monitoring patient on telemetry Continue home regimen of lipid lowering therapy and AV nodal blocking therapy   Type 2 diabetes mellitus without complication, with long-term current use of insulin (HCC) Due to tenuous oral intake placing patient on a somewhat reduced regimen of basal insulin therapy C/w Accu-Cheks before every meal and nightly with sliding scale insulin Diabetic diet initiated Hemoglobin A1c ordered and is now 7.8 CBG's ranging from 99-155  Mixed diabetic hyperlipidemia associated with type 2 diabetes mellitus ([place]) Continuing home regimen of lipid lowering therapy with Atorvastatin 40 mg po qHS   GERD without esophagitis Continuing home regimen of daily PPI therapy Pantoprazole 40 mg po daily.   Vertigo -Patient complains of Dizziness -C/w Home Meclizine  Burning with urination - Seen urinalysis initially hazy appearance with greater than 500 glucose, moderate hemoglobin, 5 ketones, negative leukocytes, negative nitrites and rare bacteria with 6-10 RBCs per high-power field; urine culture showing multiple species so will recollect   Hyperbilirubinemia -Patient's T Bili is now 1.5 -> 0.8 -Continue to Monitor and Replete as Necessary -Repeat CMP in the AM   Hypokalemia -Patient's K+ is now 3.0 -Replete with po KCl 40 mEQ BID x2 -Continue  to Monitor and Replete as Necessary -Repeat CMP in the AM   Obesity with body mass index (BMI) of 25.4 to 27.0 -Complicates overall prognosis and care -Estimated body mass index is 33.07 kg/m as calculated from the following:   Height as of this encounter: '5\' 1"'$  (1.549 m).   Weight as of this encounter: 79.4 kg.  -Weight Loss and Dietary Counseling given   DVT prophylaxis: Anticoagulated with Coumadin    Code Status: DNR Family Communication: No family present at bedside   Disposition Plan:  Level of care: Telemetry Medical Status is: Observation The patient will require care spanning > 2 midnights and should be moved to inpatient because: Needs PT/OT to evaluate   Consultants:  None  Procedures:  None  Antimicrobials:  Anti-infectives (From admission, onward)    None       Subjective: Seen and examined at bedside and she was still complaining of some suprapubic pain and lower abdominal pain.  Denies any nausea or vomiting.  Still does not feel as good and he still feels a little dizzy.  No other concerns or complaints at this time.  Objective: Vitals:   07/23/21 2001 07/24/21 0500 07/24/21 0903 07/24/21 1414  BP: 112/71 115/65 118/69 120/65  Pulse: 79 80 83 85  Resp: '18 16 16 18  '$ Temp: 98.4 F (36.9 C) 98.2 F (36.8 C) 97.8 F (36.6 C) 98.3 F (36.8 C)  TempSrc: Oral Oral Oral   SpO2: 99% 93% 95% 100%  Weight:      Height:        Intake/Output Summary (Last 24 hours) at 07/24/2021 1925 Last data filed at 07/24/2021 1413 Gross per 24 hour  Intake 160 ml  Output 510 ml  Net -350 ml  Filed Weights   07/22/21 1338  Weight: 79.4 kg   Examination: Physical Exam:  Constitutional: WN/WD obese AAF in NAD appears a little uncomfortable  Respiratory: Diminished to auscultation bilaterally with coarse breath sounds, no wheezing, rales, rhonchi or crackles. Normal respiratory effort and patient is not tachypenic. No accessory muscle use. Unlabored breathing   Cardiovascular: RRR, no murmurs / rubs / gallops. S1 and S2 auscultated. Trace edema  Abdomen: Soft, non-tender, non-distended. Bowel sounds positive.  GU: Deferred..  Neurologic: CN 2-12 grossly intact with no focal deficits.  Romberg sign and cerebellar reflexes not assessed.  Psychiatric: Normal judgment and insight. Alert and oriented x 3. Normal mood and appropriate affect.   Data Reviewed: I have personally reviewed following labs and imaging studies  CBC: Recent Labs  Lab 07/21/21 2254 07/22/21 1513 07/23/21 0235 07/24/21 0352  WBC 8.3 8.4 10.2 7.1  NEUTROABS  --   --  6.9 3.2  HGB 13.2 12.5 12.2 12.4  HCT 41.9 39.4 38.6 37.7  MCV 87.5 85.7 85.0 84.0  PLT 374 388 370 993   Basic Metabolic Panel: Recent Labs  Lab 07/21/21 2254 07/22/21 1513 07/23/21 0235 07/24/21 0352  NA 138 141 142 139  K 3.2* 4.0 3.4* 3.0*  CL 111 111 111 107  CO2 19* 21* 21* 23  GLUCOSE 188* 149* 117* 137*  BUN '13 13 14 16  '$ CREATININE 0.99 0.92 0.86 0.92  CALCIUM 9.8 9.5 9.2 9.1  MG  --   --  2.3 2.1  PHOS  --   --   --  3.7   GFR: Estimated Creatinine Clearance: 51.9 mL/min (by C-G formula based on SCr of 0.92 mg/dL). Liver Function Tests: Recent Labs  Lab 07/21/21 2254 07/23/21 0235 07/24/21 0352  AST 20 16 79*  ALT 18 17 55*  ALKPHOS 53 49 43  BILITOT 1.0 1.5* 0.8  PROT 7.2 6.8 6.6  ALBUMIN 3.6 3.2* 3.1*   Recent Labs  Lab 07/21/21 2254  LIPASE 23   No results for input(s): AMMONIA in the last 168 hours. Coagulation Profile: Recent Labs  Lab 07/21/21 2254 07/23/21 0235 07/24/21 0352  INR 2.1* 2.1* 2.3*   Cardiac Enzymes: No results for input(s): CKTOTAL, CKMB, CKMBINDEX, TROPONINI in the last 168 hours. BNP (last 3 results) No results for input(s): PROBNP in the last 8760 hours. HbA1C: Recent Labs    07/23/21 0235  HGBA1C 7.8*   CBG: Recent Labs  Lab 07/23/21 1700 07/23/21 1943 07/24/21 0902 07/24/21 1140 07/24/21 1730  GLUCAP 166* 99 108* 155* 130*    Lipid Profile: No results for input(s): CHOL, HDL, LDLCALC, TRIG, CHOLHDL, LDLDIRECT in the last 72 hours. Thyroid Function Tests: No results for input(s): TSH, T4TOTAL, FREET4, T3FREE, THYROIDAB in the last 72 hours. Anemia Panel: No results for input(s): VITAMINB12, FOLATE, FERRITIN, TIBC, IRON, RETICCTPCT in the last 72 hours. Sepsis Labs: No results for input(s): PROCALCITON, LATICACIDVEN in the last 168 hours.  Recent Results (from the past 240 hour(s))  Urine Culture     Status: Abnormal   Collection Time: 07/22/21  2:38 PM   Specimen: Urine, Clean Catch  Result Value Ref Range Status   Specimen Description URINE, CLEAN CATCH  Final   Special Requests   Final    NONE Performed at [facility], [address], [place], [place] [address]    Culture MULTIPLE SPECIES PRESENT, SUGGEST RECOLLECTION (A)  Final   Report Status 07/24/2021 FINAL  Final    Radiology Studies: DG  Abd 1 View  Result Date: 07/24/2021 CLINICAL DATA:  Abdominal distention, constipation EXAM: ABDOMEN - 1 VIEW COMPARISON:  CT done on 07/22/2021 FINDINGS: Bowel gas pattern is nonspecific. Small amount of stool is seen in the colon. There is no demonstrable fecal impaction in the rectum. Surgical clips are seen in the right upper quadrant. No abnormal masses or calcifications are seen in the abdomen. Kidneys are partly obscured by bowel contents. There is contrast in the urinary bladder from previous CT. Phleboliths are seen in the pelvis. IMPRESSION: Nonspecific bowel gas pattern. Small stool burden in the colon. There is no demonstrable fecal impaction in the rectum. Electronically Signed   By: Elmer Picker M.D.   On: 07/24/2021 15:05    Scheduled Meds:  ALPRAZolam  0.5 mg Oral BID   ALPRAZolam  1 mg Oral Q0600   atorvastatin  40 mg Oral QHS   diclofenac Sodium  2 g Topical QID   furosemide  10 mg Oral q morning   gabapentin  300 mg Oral TID   insulin aspart  0-15 Units Subcutaneous TID AC & HS    insulin glargine-yfgn  24 Units Subcutaneous QHS   metoprolol succinate  100 mg Oral BID   pantoprazole  40 mg Oral Daily   phenazopyridine  100 mg Oral TID WC   polyethylene glycol  17 g Oral BID   potassium chloride  40 mEq Oral BID   sacubitril-valsartan  1 tablet Oral BID   senna-docusate  1 tablet Oral BID   sodium chloride flush  3 mL Intravenous Q12H   spironolactone  25 mg Oral Daily   thiamine  100 mg Oral Daily   Warfarin - Pharmacist Dosing Inpatient   Does not apply q1600   Continuous Infusions:   LOS: 0 days   Raiford Noble, DO [facility] Available via Epic secure chat 7am-7pm After these hours, please refer to coverage provider listed on [email or web address] 07/24/2021, 7:25 PM

## 2021-07-24 NOTE — Progress Notes (Signed)
ANTICOAGULATION CONSULT NOTE   Pharmacy Consult for warfarin Indication: atrial fibrillation  Allergies  Allergen Reactions   Metformin And Related Other (See Comments)    Must take XR form only, cannot tolerate Regular release metformin   Cleocin [Clindamycin Hcl] Diarrhea   Codeine Itching   Doxycycline Hyclate Diarrhea   Macrolides And Ketolides Diarrhea   Morphine Hives   Pentazocine Lactate Itching and Nausea And Vomiting    (GENERIC- [place])   Vibramycin [Doxycycline Calcium] Diarrhea and Rash   Clindamycin Diarrhea   Definity [Perflutren Lipid Microsphere] Other (See Comments)    Patient complained of warm sensation in chest x few seconds duration when injected Definity.No other symptoms   Doxycycline Diarrhea   Metformin Other (See Comments)   Pentazocine Nausea Only   Perflutren Other (See Comments)   Perflutren Lipid Microspheres Other (See Comments)   Sulfa Antibiotics Diarrhea and Rash   Sulfonamide Derivatives Rash    Patient Measurements: Height: '5\' 1"'$  (154.9 cm) Weight: 79.4 kg (175 lb) IBW/kg (Calculated) : 47.8  Vital Signs: Temp: 98.2 F (36.8 C) (05/20 0500) Temp Source: Oral (05/20 0500) BP: 115/65 (05/20 0500) Pulse Rate: 80 (05/20 0500)  Labs: Recent Labs    07/21/21 2254 07/22/21 1513 07/23/21 0235 07/24/21 0352  HGB 13.2 12.5 12.2 12.4  HCT 41.9 39.4 38.6 37.7  PLT 374 388 370 335  LABPROT 23.0*  --  23.2* 25.1*  INR 2.1*  --  2.1* 2.3*  CREATININE 0.99 0.92 0.86 0.92     Estimated Creatinine Clearance: 51.9 mL/min (by C-G formula based on SCr of 0.92 mg/dL).   Medical History: Past Medical History:  Diagnosis Date   Arthritis    Blood transfusion without reported diagnosis    Bursitis    CAD (coronary artery disease)    a. s/p CABG 2004.b. stable cath 2014 demonstrating stable CAD and continued patency of her LIMA graft.   Chronic anticoagulation    on coumadin   DDD (degenerative disc disease), lumbar    Depression     Diabetes mellitus    Diastolic dysfunction    per echo in October 2012 with EF 50 to 55%   Fibromyalgia    GERD (gastroesophageal reflux disease) 10/23/2003   Headache(784.0)    Hyperlipidemia    Hypertension    Hypokalemia    LBBB (left bundle branch block)    Lumbar back pain    LV dysfunction    a. EF 45% by cath 2014. b. EF 50-55% by technically difficult echo in 08/2014.   Lymphadenitis    Morbid obesity ([place])    a. Sleep study negative for significant OSA in 11/2014.   PAF (paroxysmal atrial fibrillation) ([place])    Stroke [facility]) 2004   affected speech per pt    Assessment: 76 yo F here for rectal pain and constipation. On warfarin PTA for afib. PTA regimen 4.5 mg Monday and 3 mg on all other days. Admission INR 2.1.   INR 2.3 - therapeutic  Goal of Therapy:  INR 2-3 Monitor platelets by anticoagulation protocol: Yes   Plan:  Warfarin 3 mg tonight INR daily   Lestine Box, PharmD PGY2 Infectious Diseases Pharmacy Resident   Please check [email or web address] for unit-specific pharmacy phone numbers

## 2021-07-25 DIAGNOSIS — I25119 Atherosclerotic heart disease of native coronary artery with unspecified angina pectoris: Secondary | ICD-10-CM | POA: Diagnosis not present

## 2021-07-25 DIAGNOSIS — R300 Dysuria: Secondary | ICD-10-CM | POA: Diagnosis not present

## 2021-07-25 DIAGNOSIS — E8809 Other disorders of plasma-protein metabolism, not elsewhere classified: Secondary | ICD-10-CM

## 2021-07-25 DIAGNOSIS — I5042 Chronic combined systolic (congestive) and diastolic (congestive) heart failure: Secondary | ICD-10-CM | POA: Diagnosis not present

## 2021-07-25 DIAGNOSIS — R7989 Other specified abnormal findings of blood chemistry: Secondary | ICD-10-CM

## 2021-07-25 DIAGNOSIS — K5641 Fecal impaction: Secondary | ICD-10-CM | POA: Diagnosis not present

## 2021-07-25 LAB — CBC WITH DIFFERENTIAL/PLATELET
Abs Immature Granulocytes: 0.01 10*3/uL (ref 0.00–0.07)
Basophils Absolute: 0 10*3/uL (ref 0.0–0.1)
Basophils Relative: 1 %
Eosinophils Absolute: 0.1 10*3/uL (ref 0.0–0.5)
Eosinophils Relative: 2 %
HCT: 38.4 % (ref 36.0–46.0)
Hemoglobin: 12.3 g/dL (ref 12.0–15.0)
Immature Granulocytes: 0 %
Lymphocytes Relative: 41 %
Lymphs Abs: 2.2 10*3/uL (ref 0.7–4.0)
MCH: 27.5 pg (ref 26.0–34.0)
MCHC: 32 g/dL (ref 30.0–36.0)
MCV: 85.9 fL (ref 80.0–100.0)
Monocytes Absolute: 0.7 10*3/uL (ref 0.1–1.0)
Monocytes Relative: 13 %
Neutro Abs: 2.3 10*3/uL (ref 1.7–7.7)
Neutrophils Relative %: 43 %
Platelets: 299 10*3/uL (ref 150–400)
RBC: 4.47 MIL/uL (ref 3.87–5.11)
RDW: 17.3 % — ABNORMAL HIGH (ref 11.5–15.5)
WBC: 5.4 10*3/uL (ref 4.0–10.5)
nRBC: 0 % (ref 0.0–0.2)

## 2021-07-25 LAB — COMPREHENSIVE METABOLIC PANEL
ALT: 57 U/L — ABNORMAL HIGH (ref 0–44)
AST: 51 U/L — ABNORMAL HIGH (ref 15–41)
Albumin: 3 g/dL — ABNORMAL LOW (ref 3.5–5.0)
Alkaline Phosphatase: 49 U/L (ref 38–126)
Anion gap: 7 (ref 5–15)
BUN: 15 mg/dL (ref 8–23)
CO2: 24 mmol/L (ref 22–32)
Calcium: 9.2 mg/dL (ref 8.9–10.3)
Chloride: 108 mmol/L (ref 98–111)
Creatinine, Ser: 0.92 mg/dL (ref 0.44–1.00)
GFR, Estimated: >60 mL/min (ref >60)
Glucose, Bld: 107 mg/dL — ABNORMAL HIGH (ref 70–99)
Potassium: 4.1 mmol/L (ref 3.5–5.1)
Sodium: 139 mmol/L (ref 135–145)
Total Bilirubin: 1 mg/dL (ref 0.3–1.2)
Total Protein: 6.5 g/dL (ref 6.5–8.1)

## 2021-07-25 LAB — GLUCOSE, CAPILLARY
Glucose-Capillary: 129 mg/dL — ABNORMAL HIGH (ref 70–99)
Glucose-Capillary: 173 mg/dL — ABNORMAL HIGH (ref 70–99)
Glucose-Capillary: 159 mg/dL — ABNORMAL HIGH (ref 70–99)
Glucose-Capillary: 192 mg/dL — ABNORMAL HIGH (ref 70–99)

## 2021-07-25 LAB — MAGNESIUM: Magnesium: 1.9 mg/dL (ref 1.7–2.4)

## 2021-07-25 LAB — PHOSPHORUS: Phosphorus: 4 mg/dL (ref 2.5–4.6)

## 2021-07-25 LAB — PROTIME-INR
INR: 2.2 — ABNORMAL HIGH (ref 0.8–1.2)
Prothrombin Time: 24.5 seconds — ABNORMAL HIGH (ref 11.4–15.2)

## 2021-07-25 MED ORDER — MILK AND MOLASSES ENEMA
1.0000 | Freq: Once | RECTAL | Status: DC
  Filled 2021-07-25: qty 240

## 2021-07-25 MED ORDER — SORBITOL 70 % SOLN: 960.0000 mL | TOPICAL_OIL | Freq: Once | ORAL | Status: DC

## 2021-07-25 MED ORDER — WARFARIN SODIUM 3 MG PO TABS
3.0000 mg | ORAL_TABLET | Freq: Once | ORAL | Status: AC
  Administered 2021-07-25: 3 mg via ORAL
  Filled 2021-07-25: qty 1

## 2021-07-25 MED ORDER — OXYBUTYNIN CHLORIDE 5 MG PO TABS: 5.0000 mg | ORAL_TABLET | Freq: Three times a day (TID) | ORAL | Status: DC | PRN

## 2021-07-25 NOTE — Progress Notes (Signed)
ANTICOAGULATION CONSULT NOTE   Pharmacy Consult for warfarin Indication: atrial fibrillation  Allergies  Allergen Reactions   Metformin And Related Other (See Comments)    Must take XR form only, cannot tolerate Regular release metformin   Cleocin [Clindamycin Hcl] Diarrhea   Codeine Itching   Doxycycline Hyclate Diarrhea   Macrolides And Ketolides Diarrhea   Morphine Hives   Pentazocine Lactate Itching and Nausea And Vomiting    (GENERIC- [place])   Vibramycin [Doxycycline Calcium] Diarrhea and Rash   Clindamycin Diarrhea   Definity [Perflutren Lipid Microsphere] Other (See Comments)    Patient complained of warm sensation in chest x few seconds duration when injected Definity.No other symptoms   Doxycycline Diarrhea   Metformin Other (See Comments)   Pentazocine Nausea Only   Perflutren Other (See Comments)   Perflutren Lipid Microspheres Other (See Comments)   Sulfa Antibiotics Diarrhea and Rash   Sulfonamide Derivatives Rash    Patient Measurements: Height: '5\' 1"'$  (154.9 cm) Weight: 80.1 kg (176 lb 9.4 oz) IBW/kg (Calculated) : 47.8  Vital Signs: Temp: 97.7 F (36.5 C) (05/21 0431) Temp Source: Oral (05/21 0431) BP: 111/69 (05/21 0431) Pulse Rate: 83 (05/21 0431)  Labs: Recent Labs    07/23/21 0235 07/24/21 0352 07/25/21 0619  HGB 12.2 12.4 12.3  HCT 38.6 37.7 38.4  PLT 370 335 299  LABPROT 23.2* 25.1* 24.5*  INR 2.1* 2.3* 2.2*  CREATININE 0.86 0.92 0.92     Estimated Creatinine Clearance: 52.2 mL/min (by C-G formula based on SCr of 0.92 mg/dL).   Medical History: Past Medical History:  Diagnosis Date   Arthritis    Blood transfusion without reported diagnosis    Bursitis    CAD (coronary artery disease)    a. s/p CABG 2004.b. stable cath 2014 demonstrating stable CAD and continued patency of her LIMA graft.   Chronic anticoagulation    on coumadin   DDD (degenerative disc disease), lumbar    Depression    Diabetes mellitus    Diastolic  dysfunction    per echo in October 2012 with EF 50 to 55%   Fibromyalgia    GERD (gastroesophageal reflux disease) 10/23/2003   Headache(784.0)    Hyperlipidemia    Hypertension    Hypokalemia    LBBB (left bundle branch block)    Lumbar back pain    LV dysfunction    a. EF 45% by cath 2014. b. EF 50-55% by technically difficult echo in 08/2014.   Lymphadenitis    Morbid obesity ([place])    a. Sleep study negative for significant OSA in 11/2014.   PAF (paroxysmal atrial fibrillation) ([place])    Stroke [facility]) 2004   affected speech per pt    Assessment: 76 yo F here for rectal pain and constipation. On warfarin PTA for afib. PTA regimen 4.5 mg Monday and 3 mg on all other days. Admission INR 2.1.   INR 2.2 - therapeutic, CBC stable  Goal of Therapy:  INR 2-3 Monitor platelets by anticoagulation protocol: Yes   Plan:  Warfarin 3 mg tonight INR daily   Lestine Box, PharmD PGY2 Infectious Diseases Pharmacy Resident   Please check [email or web address] for unit-specific pharmacy phone numbers

## 2021-07-25 NOTE — Assessment & Plan Note (Addendum)
-   LFTs are slightly elevated yesterday and AST trended up to 79 and ALT went to 55; now AST is now resolved at 24 and ALT is now 38 -Continue to monitor and trend if necessary will obtain right upper quadrant ultrasound as well as an acute hepatitis panel -Repeat CMP within 1 week

## 2021-07-25 NOTE — Evaluation (Signed)
Occupational Therapy Evaluation Patient Details Name: Kristen [name] MRN: [id number] DOB: 11-08-1945 Today's Date: 07/25/2021   History of Present Illness 76 y.o. female presents to [facility] hospital on 07/22/2021 with complaints of constipation and rectal pain. CT abdomen reveals distention of the rectum with rectal wall thickening and enhancement consistent with stercoral colitis. PMH includes CAD s/p CABG, PAF, HLD, DMII, CHF, GERD, CVA.   Clinical Impression   PTA, pt lives with spouse, typically Modified Independent with ADLs and mobility with intermittent cane use. Pt presents now with deficits in strength, endurance, cognition and dynamic standing balance. On entry, pt noted with soiled linens due to purewick malfunction. Pt able to stand for clean-up > 7 min with improved stability/confidence when using RW for support during ADLs. Overall, pt requires Setup for UB ADL, Min A for LB ADL and min guard for basic transfers. Based on functional abilities, encouraged pt to use [facility] and/or mobilize to bathroom with staff assist and avoid using purewick. Educated that BSC/toilet use may naturally improve ability to have BM rather than using bedpan. Anticipate pt to progress with continued OOB activities to return home with [place] follow-up and family assist at DC.      Recommendations for follow up therapy are one component of a multi-disciplinary discharge planning process, led by the attending physician.  Recommendations may be updated based on patient status, additional functional criteria and insurance authorization.   Follow Up Recommendations  Home health OT    Assistance Recommended at Discharge Intermittent Supervision/Assistance  Patient can return home with the following A little help with bathing/dressing/bathroom;Assistance with cooking/housework    Functional Status Assessment  Patient has had a recent decline in their functional status and demonstrates the ability to make significant  improvements in function in a reasonable and predictable amount of time.  Equipment Recommendations  None recommended by OT    Recommendations for Other Services       Precautions / Restrictions Precautions Precautions: Fall Restrictions Weight Bearing Restrictions: No      Mobility Bed Mobility Overal bed mobility: Needs Assistance Bed Mobility: Supine to Sit, Sit to Supine     Supine to sit: Supervision, HOB elevated Sit to supine: Supervision   General bed mobility comments: increased time    Transfers Overall transfer level: Needs assistance Equipment used: Rolling walker (2 wheels), None Transfers: Sit to/from Stand Sit to Stand: Min guard           General transfer comment: standing without device though reaching for bedrail, etc. appeared more confident/steady when RW placed in front of pt for standing ADLs      Balance Overall balance assessment: Needs assistance Sitting-balance support: No upper extremity supported, Feet supported Sitting balance-Leahy Scale: Good     Standing balance support: Bilateral upper extremity supported, Reliant on assistive device for balance Standing balance-Leahy Scale: Poor                             ADL either performed or assessed with clinical judgement   ADL Overall ADL's : Needs assistance/impaired Eating/Feeding: Independent;Sitting   Grooming: Set up;Sitting   Upper Body Bathing: Set up;Sitting   Lower Body Bathing: Minimal assistance;Sit to/from stand Lower Body Bathing Details (indicate cue type and reason): able to stand > 7 min for LB bathing of peri region, minor cues for posture. likely will need assist for reaching B feet Upper Body Dressing : Set up;Sitting   Lower Body Dressing:  Minimal assistance;Sit to/from stand   Toilet Transfer: Min guard;Rolling walker (2 wheels)   Toileting- Clothing Manipulation and Hygiene: Minimal assistance;Sit to/from stand;Sitting/lateral lean          General ADL Comments: Weakness noted due to decreased OOB activities, decreased PO intake and GI issues     Vision Baseline Vision/History: 0 No visual deficits Ability to See in Adequate Light: 0 Adequate Patient Visual Report: No change from baseline Vision Assessment?: No apparent visual deficits     Perception     Praxis      Pertinent Vitals/Pain Pain Assessment Pain Assessment: Faces Faces Pain Scale: Hurts a little bit Pain Location: stomach Pain Descriptors / Indicators: Grimacing, Guarding Pain Intervention(s): Monitored during session     Hand Dominance Right   Extremity/Trunk Assessment Upper Extremity Assessment Upper Extremity Assessment: Generalized weakness   Lower Extremity Assessment Lower Extremity Assessment: Defer to PT evaluation   Cervical / Trunk Assessment Cervical / Trunk Assessment: Kyphotic   Communication Communication Communication: No difficulties   Cognition Arousal/Alertness: Awake/alert Behavior During Therapy: Flat affect Overall Cognitive Status: No family/caregiver present to determine baseline cognitive functioning                                 General Comments: Pleasant, flat affect and benefits from cues for problem solving. Pt with some paranoid behaviors noted, often seeing a small speck of lint, etc and thinking it was a bed bug. Pt thought this when a speck noted on washcloth after peri care - thought she had injested bedbugs     General Comments  on 2 L O2, reports they placed this because she was SOB overnight. SpO2 100% on 2 L O2, denies SOB with ADLs - RN aware    Exercises     Shoulder Instructions      Home Living Family/patient expects to be discharged to:: Private residence Living Arrangements: Spouse/significant other Available Help at Discharge: Family;Available 24 hours/day Type of Home: Apartment Home Access: Level entry     Home Layout: One level     Bathroom Shower/Tub: Arts administrator: Handicapped height     Home Equipment: Cane - single point;Rollator (4 wheels);Shower seat          Prior Functioning/Environment Prior Level of Function : Independent/Modified Independent             Mobility Comments: PRN use of cane, especially for longer distances ADLs Comments: Able to complete ADLs without assist        OT Problem List: Decreased strength;Decreased activity tolerance;Impaired balance (sitting and/or standing);Decreased safety awareness;Decreased cognition;Decreased knowledge of use of DME or AE;Pain      OT Treatment/Interventions: Self-care/ADL training;Therapeutic exercise;Energy conservation;DME and/or AE instruction;Therapeutic activities;Patient/family education;Balance training    OT Goals(Current goals can be found in the care plan section) Acute Rehab OT Goals Patient Stated Goal: resolve bedbug issues at home OT Goal Formulation: With patient Time For Goal Achievement: 08/08/21 Potential to Achieve Goals: Good  OT Frequency: Min 2X/week    Co-evaluation              AM-PAC OT "6 Clicks" Daily Activity     Outcome Measure Help from another person eating meals?: None Help from another person taking care of personal grooming?: A Little Help from another person toileting, which includes using toliet, bedpan, or urinal?: A Little Help from another person bathing (including washing, rinsing, drying)?:  A Little Help from another person to put on and taking off regular upper body clothing?: A Little Help from another person to put on and taking off regular lower body clothing?: A Little 6 Click Score: 19   End of Session Equipment Utilized During Treatment: Rolling walker (2 wheels);Oxygen Nurse Communication: Mobility status;Other (comment) (BSC and bathroom use instead of purewick)  Activity Tolerance: Patient tolerated treatment well Patient left: in bed;with call bell/phone within reach;with bed alarm  set  OT Visit Diagnosis: Unsteadiness on feet (R26.81);Other abnormalities of gait and mobility (R26.89);Muscle weakness (generalized) (M62.81)                Time: 5790-3833 OT Time Calculation (min): 29 min Charges:  OT General Charges $OT Visit: 1 Visit OT Evaluation $OT Eval Low Complexity: 1 Low OT Treatments $Self Care/Home Management : 8-22 mins  Malachy Chamber, OTR/L Acute Rehab Services Office: [phone]   Layla Maw 07/25/2021, 12:04 PM

## 2021-07-25 NOTE — Assessment & Plan Note (Addendum)
-  Patient's Albumin level is now gone from 3.1 -> 3.0 -> 2.9 -Continue to monitor and trend and repeat CMP in a.m.

## 2021-07-25 NOTE — Progress Notes (Signed)
PROGRESS NOTE    Kristen [name]  WPY:[id number] DOB: 09-16-1945 DOA: 07/22/2021 PCP: [name], MD   Brief Narrative:  HPI per Dr. Inda Merlin on 07/22/21 76 year old female with past medical history of coronary artery disease status post CABG, paroxysmal atrial fibrillation, hyperlipidemia, insulin-dependent diabetes mellitus type 2, systolic and diastolic congestive heart failure (Echo 05/2021 EF 25-30% with G1DD), gastroesophageal reflux disease, remote history of prior stroke and left bundle branch block who presents to [facility] emergency department via EMS with complaints of constipation and rectal pain.   Patient explains that for the past several weeks she has had been experiencing increasingly worsening constipation.  Patient states that when she does move her bowels her bowel movements are very small.  As the patient's symptoms have worsened patient is developed progressively worsening lower abdominal and rectal pain.  Patient describes his pain as sore in quality, worse with movement and quite severe with attempts at moving her bowels.  Patient denies any associated fevers, sick contacts, recent travel.   Patient symptoms continue to worsen and she eventually presented to [facility] emergency department the evening of 5/17.  At that time patient underwent fecal disimpaction by the emergency department provider and was discharged home on oral senna and docusate.   In the following 24 hours patient continued to have worsening lower abdominal pain that became more and more severe.  This came associated with low back pain and a sensation of dysuria with bouts of urinary incontinence.  Due to persisting symptoms the patient presented again to [facility] emergency permit via EMS for evaluation.   Upon evaluation in the emergency department CT imaging of the abdomen and pelvis were performed revealing no evidence of perforation but identifying distention  of the rectum with rectal wall thickening and enhancement consistent with stercoral colitis.  A repeat attempt at disimpaction was performed followed by administration of a smog enema.  Due to minimal improvement with these interventions and ongoing substantial symptoms ER providers requesting hospitalist group evaluate patient for admission to the hospital.  **Interim History  She was having 2 liquidy bowel movements now and still complaining of some abdominal discomfort.  Now her bowel movements have stopped and she is concerned about constipation again given difficulty going again so we will give her another enema.  A smog enema was ordered however pharmacy informing that they are on backorder so we will give her milk of molasses enema.  Her main concern is now burning discomfort in her urine.  Urinalysis was obtained and urine culture still pending and showed multiple species so we will recollect. Will obtain PT/OT to evaluate and treat and recommending home health.  She continues to be somewhat dizzy and we will continue her on meclizine but if she continues to be further dizzy will further work-up with imaging    Assessment and Plan: * Fecal impaction in rectum [facility]) Patient presenting with substantial abdominal pain and several weeks of constipation Likely primarily due to ongoing opiate use with noncompliance with bowel regimen use possibly exacerbated by relative constipation due to ongoing diuretics Patient is already undergone 2 attempts at fecal disimpaction without significant improvement; having liquidy bowel movements but could be concern for overflow diarrhea Was given a mineral oil enema with MiraLAX and Senna-Docusate overnight.  Encouraging patient to minimize opiate use is much as possible, patient is on a substantial regimen of oxycodone. Once impaction has resolved, patient would likely benefit from initiation of Relistor or  Amitiza If not improving further will consider  Gastroenterology Evaluation  -Repeat KUB this afternoon showed "Bowel gas pattern is nonspecific. Small amount of stool is seen in the colon. There is no demonstrable fecal impaction in the rectum. Surgical clips are seen in the right upper quadrant. No abnormal masses or calcifications are seen in the abdomen. Kidneys are partly obscured by bowel contents. There is contrast in the urinary bladder from previous CT. Phleboliths are seen in the pelvis." -Patient had a bowel movement yesterday however she states that she is having normal bowel movements and is concerned about constipation having lower abdominal pain.  We will give her milk of molasses enema given that smog enema is on backorder -She will need adequate bowel regimen for discharge -We will continue laxatives with stool softeners as well as enema today  Stercoral colitis See assessment and plan above  Chronic combined systolic and diastolic CHF (congestive heart failure) (HCC) No clinical evidence of cardiogenic volume overload Continue home regimen of diuretics including Furosemide 10 mg po Daily and Spironolactone 25 mg po daily and Sacubitril-Valsartan 49-51 1 tab po BID    Paroxysmal atrial fibrillation (HCC) Currently rate controlled. Continue home regimen of anticoagulation with Coumadin with Pharmacy  Continue home regimen of rate controlling agent with metoprolol twice daily Continue Monitoring on Telemetry   Coronary artery disease involving native coronary artery of native heart with angina pectoris [facility]) Patient is currently chest pain free Continue Monitoring patient on telemetry Continue home regimen of lipid lowering therapy and AV nodal blocking therapy   Type 2 diabetes mellitus without complication, with long-term current use of insulin (HCC) Due to tenuous oral intake placing patient on a somewhat reduced regimen of basal insulin therapy C/w Accu-Cheks before every meal and nightly with sliding scale  insulin Diabetic diet initiated Hemoglobin A1c ordered and is now 7.8 CBG's ranging from 129-175  Mixed diabetic hyperlipidemia associated with type 2 diabetes mellitus ([place]) Continuing home regimen of lipid lowering therapy with Atorvastatin 40 mg po qHS   GERD without esophagitis Continuing home regimen of daily PPI therapy Pantoprazole 40 mg po daily.   Hypoalbuminemia -Patient's Albumin level is now gone from 3.1 -> 3.0 -Continue to monitor and trend and repeat CMP in a.m.  Abnormal LFTs - LFTs are slightly elevated yesterday and AST trended up to 79 and ALT went to 55; now AST is 51 and ALT is 57 -Continue to monitor and trend if necessary will obtain right upper quadrant ultrasound as well as an acute hepatitis panel -Repeat CMP in a.m.  Vertigo -Patient complains of Dizziness -C/w Home Meclizine but she continues to be significantly dizzy may consider head imaging  Burning with urination - Seen urinalysis initially hazy appearance with greater than 500 glucose, moderate hemoglobin, 5 ketones, negative leukocytes, negative nitrites and rare bacteria with 6-10 RBCs per high-power field; urine culture showing multiple species so will recollect  -Recollection has been ordered but not obtained  Hyperbilirubinemia -Patient's T Bili is now 1.5 -> 0.8 and is 1.0 today -Continue to Monitor and Replete as Necessary -Repeat CMP in the AM   Hypokalemia -Patient's K+ is now 3.0 yesterday and today is 4.1 -Replete with po KCl 40 mEQ BID x2 yesterday -Continue to Monitor and Replete as Necessary -Repeat CMP in the AM   Obesity with body mass index (BMI) of 53.9 to 76.7 -Complicates overall prognosis and care -Estimated body mass index is 33.07 kg/m as calculated from the following:   Height as  of this encounter: '5\' 1"'$  (1.549 m).   Weight as of this encounter: 79.4 kg.  -Weight Loss and Dietary Counseling given   DVT prophylaxis:   Anticoagulated with Coumadin    Code  Status: DNR Family Communication: Discussed with sister at bedside  Disposition Plan:  Level of care: Telemetry Medical Status is: Observation The patient will require care spanning > 2 midnights and should be moved to inpatient because: Continues to be constipated and will try another enema today.  PT OT recommending home health and anticipating discharge in next 24 to 48 hours if she is improved  Consultants:  None  Procedures:  None  Antimicrobials:  Anti-infectives (From admission, onward)    None       Subjective: Seen and examined at bedside and was extremely dizzy and was asking for meclizine.  Also states that she has not had a bowel movement in in about 24 hours and has pressure in her bowels and feels like she is constipated again.  Also complaining about pain over the bladder.  Felt little nauseous.  No other concerns or complaints at this time.  PT OT recommending home health when she is medically stable for discharge.  Objective: Vitals:   07/25/21 0431 07/25/21 0830 07/25/21 1258 07/25/21 1612  BP: 111/69 125/79 (!) 105/58 108/89  Pulse: 83 91 87 86  Resp: '16 16 16 16  '$ Temp: 97.7 F (36.5 C) 98.5 F (36.9 C)  98.1 F (36.7 C)  TempSrc: Oral     SpO2: 100% 100% 100% 100%  Weight: 80.1 kg     Height:        Intake/Output Summary (Last 24 hours) at 07/25/2021 1838 Last data filed at 07/25/2021 4782 Gross per 24 hour  Intake 400 ml  Output 400 ml  Net 0 ml   Filed Weights   07/22/21 1338 07/25/21 0431  Weight: 79.4 kg 80.1 kg   Examination: Physical Exam:  Constitutional: WN/WD obese African-American female in mild distress appears uncomfortable and dizzy Respiratory: Diminished to auscultation bilaterally, no wheezing, rales, rhonchi or crackles. Normal respiratory effort and patient is not tachypenic. No accessory muscle use.  Unlabored breathing Cardiovascular: RRR, no murmurs / rubs / gallops. S1 and S2 auscultated.  Mild lower extremity  edema Abdomen: Soft, tender to palpate especially in the lower abdomen, distended secondary body habitus. Bowel sounds positive.  GU: Deferred. Musculoskeletal: No clubbing / cyanosis of digits/nails. No joint deformity upper and lower extremities. Neurologic: CN 2-12 grossly intact with no focal deficits. Romberg sign and cerebellar reflexes not assessed.  Psychiatric: Normal judgment and insight. Alert and oriented x 3. Normal mood and appropriate affect.   Data Reviewed: I have personally reviewed following labs and imaging studies  CBC: Recent Labs  Lab 07/21/21 2254 07/22/21 1513 07/23/21 0235 07/24/21 0352 07/25/21 0619  WBC 8.3 8.4 10.2 7.1 5.4  NEUTROABS  --   --  6.9 3.2 2.3  HGB 13.2 12.5 12.2 12.4 12.3  HCT 41.9 39.4 38.6 37.7 38.4  MCV 87.5 85.7 85.0 84.0 85.9  PLT 374 388 370 335 956   Basic Metabolic Panel: Recent Labs  Lab 07/21/21 2254 07/22/21 1513 07/23/21 0235 07/24/21 0352 07/25/21 0619  NA 138 141 142 139 139  K 3.2* 4.0 3.4* 3.0* 4.1  CL 111 111 111 107 108  CO2 19* 21* 21* 23 24  GLUCOSE 188* 149* 117* 137* 107*  BUN '13 13 14 16 15  '$ CREATININE 0.99 0.92 0.86 0.92 0.92  CALCIUM  9.8 9.5 9.2 9.1 9.2  MG  --   --  2.3 2.1 1.9  PHOS  --   --   --  3.7 4.0   GFR: Estimated Creatinine Clearance: 52.2 mL/min (by C-G formula based on SCr of 0.92 mg/dL). Liver Function Tests: Recent Labs  Lab 07/21/21 2254 07/23/21 0235 07/24/21 0352 07/25/21 0619  AST 20 16 79* 51*  ALT 18 17 55* 57*  ALKPHOS 53 49 43 49  BILITOT 1.0 1.5* 0.8 1.0  PROT 7.2 6.8 6.6 6.5  ALBUMIN 3.6 3.2* 3.1* 3.0*   Recent Labs  Lab 07/21/21 2254  LIPASE 23   No results for input(s): AMMONIA in the last 168 hours. Coagulation Profile: Recent Labs  Lab 07/21/21 2254 07/23/21 0235 07/24/21 0352 07/25/21 0619  INR 2.1* 2.1* 2.3* 2.2*   Cardiac Enzymes: No results for input(s): CKTOTAL, CKMB, CKMBINDEX, TROPONINI in the last 168 hours. BNP (last 3 results) No  results for input(s): PROBNP in the last 8760 hours. HbA1C: Recent Labs    07/23/21 0235  HGBA1C 7.8*   CBG: Recent Labs  Lab 07/24/21 1730 07/24/21 2046 07/25/21 0849 07/25/21 1254 07/25/21 1610  GLUCAP 130* 175* 129* 173* 159*   Lipid Profile: No results for input(s): CHOL, HDL, LDLCALC, TRIG, CHOLHDL, LDLDIRECT in the last 72 hours. Thyroid Function Tests: No results for input(s): TSH, T4TOTAL, FREET4, T3FREE, THYROIDAB in the last 72 hours. Anemia Panel: No results for input(s): VITAMINB12, FOLATE, FERRITIN, TIBC, IRON, RETICCTPCT in the last 72 hours. Sepsis Labs: No results for input(s): PROCALCITON, LATICACIDVEN in the last 168 hours.  Recent Results (from the past 240 hour(s))  Urine Culture     Status: Abnormal   Collection Time: 07/22/21  2:38 PM   Specimen: Urine, Clean Catch  Result Value Ref Range Status   Specimen Description URINE, CLEAN CATCH  Final   Special Requests   Final    NONE Performed at [facility], [address], [place], [place] [address]    Culture MULTIPLE SPECIES PRESENT, SUGGEST RECOLLECTION (A)  Final   Report Status 07/24/2021 FINAL  Final     Radiology Studies: DG Abd 1 View  Result Date: 07/24/2021 CLINICAL DATA:  Abdominal distention, constipation EXAM: ABDOMEN - 1 VIEW COMPARISON:  CT done on 07/22/2021 FINDINGS: Bowel gas pattern is nonspecific. Small amount of stool is seen in the colon. There is no demonstrable fecal impaction in the rectum. Surgical clips are seen in the right upper quadrant. No abnormal masses or calcifications are seen in the abdomen. Kidneys are partly obscured by bowel contents. There is contrast in the urinary bladder from previous CT. Phleboliths are seen in the pelvis. IMPRESSION: Nonspecific bowel gas pattern. Small stool burden in the colon. There is no demonstrable fecal impaction in the rectum. Electronically Signed   By: Elmer Picker M.D.   On: 07/24/2021 15:05    Scheduled Meds:   ALPRAZolam  0.5 mg Oral BID   ALPRAZolam  1 mg Oral Q0600   atorvastatin  40 mg Oral QHS   diclofenac Sodium  2 g Topical QID   furosemide  10 mg Oral q morning   gabapentin  300 mg Oral TID   insulin aspart  0-15 Units Subcutaneous TID AC & HS   insulin glargine-yfgn  24 Units Subcutaneous QHS   metoprolol succinate  100 mg Oral BID   milk and molasses  1 enema Rectal Once   pantoprazole  40 mg Oral Daily   phenazopyridine  100 mg Oral TID WC   polyethylene glycol  17 g Oral BID   potassium chloride  40 mEq Oral BID   sacubitril-valsartan  1 tablet Oral BID   senna-docusate  1 tablet Oral BID   sodium chloride flush  3 mL Intravenous Q12H   spironolactone  25 mg Oral Daily   thiamine  100 mg Oral Daily   Warfarin - Pharmacist Dosing Inpatient   Does not apply q1600   Continuous Infusions:   LOS: 0 days   Raiford Noble, DO [facility] Available via Epic secure chat 7am-7pm After these hours, please refer to coverage provider listed on [email or web address] 07/25/2021, 6:38 PM

## 2021-07-26 ENCOUNTER — Observation Stay (HOSPITAL_COMMUNITY): Payer: Medicare Other

## 2021-07-26 DIAGNOSIS — R531 Weakness: Secondary | ICD-10-CM | POA: Diagnosis not present

## 2021-07-26 DIAGNOSIS — J9811 Atelectasis: Secondary | ICD-10-CM | POA: Diagnosis not present

## 2021-07-26 DIAGNOSIS — K5641 Fecal impaction: Secondary | ICD-10-CM | POA: Diagnosis not present

## 2021-07-26 DIAGNOSIS — I25119 Atherosclerotic heart disease of native coronary artery with unspecified angina pectoris: Secondary | ICD-10-CM | POA: Diagnosis not present

## 2021-07-26 DIAGNOSIS — M79671 Pain in right foot: Secondary | ICD-10-CM

## 2021-07-26 DIAGNOSIS — R300 Dysuria: Secondary | ICD-10-CM | POA: Diagnosis not present

## 2021-07-26 DIAGNOSIS — I5042 Chronic combined systolic (congestive) and diastolic (congestive) heart failure: Secondary | ICD-10-CM | POA: Diagnosis not present

## 2021-07-26 DIAGNOSIS — R0602 Shortness of breath: Secondary | ICD-10-CM | POA: Diagnosis not present

## 2021-07-26 LAB — CBC WITH DIFFERENTIAL/PLATELET
Abs Immature Granulocytes: 0.01 10*3/uL (ref 0.00–0.07)
Basophils Absolute: 0 10*3/uL (ref 0.0–0.1)
Basophils Relative: 1 %
Eosinophils Absolute: 0.1 10*3/uL (ref 0.0–0.5)
Eosinophils Relative: 2 %
HCT: 38.1 % (ref 36.0–46.0)
Hemoglobin: 12 g/dL (ref 12.0–15.0)
Immature Granulocytes: 0 %
Lymphocytes Relative: 48 %
Lymphs Abs: 2.8 10*3/uL (ref 0.7–4.0)
MCH: 27 pg (ref 26.0–34.0)
MCHC: 31.5 g/dL (ref 30.0–36.0)
MCV: 85.8 fL (ref 80.0–100.0)
Monocytes Absolute: 0.7 10*3/uL (ref 0.1–1.0)
Monocytes Relative: 12 %
Neutro Abs: 2.1 10*3/uL (ref 1.7–7.7)
Neutrophils Relative %: 37 %
Platelets: 321 10*3/uL (ref 150–400)
RBC: 4.44 MIL/uL (ref 3.87–5.11)
RDW: 17.6 % — ABNORMAL HIGH (ref 11.5–15.5)
WBC: 5.6 10*3/uL (ref 4.0–10.5)
nRBC: 0 % (ref 0.0–0.2)

## 2021-07-26 LAB — COMPREHENSIVE METABOLIC PANEL
ALT: 50 U/L — ABNORMAL HIGH (ref 0–44)
AST: 35 U/L (ref 15–41)
Albumin: 2.9 g/dL — ABNORMAL LOW (ref 3.5–5.0)
Alkaline Phosphatase: 48 U/L (ref 38–126)
Anion gap: 8 (ref 5–15)
BUN: 9 mg/dL (ref 8–23)
CO2: 20 mmol/L — ABNORMAL LOW (ref 22–32)
Calcium: 9.3 mg/dL (ref 8.9–10.3)
Chloride: 108 mmol/L (ref 98–111)
Creatinine, Ser: 0.68 mg/dL (ref 0.44–1.00)
GFR, Estimated: >60 mL/min (ref >60)
Glucose, Bld: 133 mg/dL — ABNORMAL HIGH (ref 70–99)
Potassium: 3.8 mmol/L (ref 3.5–5.1)
Sodium: 136 mmol/L (ref 135–145)
Total Bilirubin: 0.6 mg/dL (ref 0.3–1.2)
Total Protein: 6.7 g/dL (ref 6.5–8.1)

## 2021-07-26 LAB — GLUCOSE, CAPILLARY
Glucose-Capillary: 152 mg/dL — ABNORMAL HIGH (ref 70–99)
Glucose-Capillary: 187 mg/dL — ABNORMAL HIGH (ref 70–99)
Glucose-Capillary: 153 mg/dL — ABNORMAL HIGH (ref 70–99)
Glucose-Capillary: 174 mg/dL — ABNORMAL HIGH (ref 70–99)

## 2021-07-26 LAB — PROTIME-INR
INR: 2.2 — ABNORMAL HIGH (ref 0.8–1.2)
Prothrombin Time: 23.8 seconds — ABNORMAL HIGH (ref 11.4–15.2)

## 2021-07-26 LAB — PHOSPHORUS: Phosphorus: 3 mg/dL (ref 2.5–4.6)

## 2021-07-26 LAB — MAGNESIUM: Magnesium: 1.9 mg/dL (ref 1.7–2.4)

## 2021-07-26 MED ORDER — WARFARIN SODIUM 4 MG PO TABS
4.5000 mg | ORAL_TABLET | Freq: Once | ORAL | Status: AC
  Administered 2021-07-26: 4.5 mg via ORAL
  Filled 2021-07-26: qty 1

## 2021-07-26 MED ORDER — FUROSEMIDE 20 MG PO TABS
20.0000 mg | ORAL_TABLET | Freq: Every morning | ORAL | Status: DC
  Administered 2021-07-26 – 2021-07-27 (×2): 20 mg via ORAL
  Filled 2021-07-26 (×2): qty 1

## 2021-07-26 NOTE — TOC Progression Note (Addendum)
Transition of Care [facility]) - Progression Note    Patient Details  Name: Kristen [name] MRN: [id number] Date of Birth: [birth year]  Transition of [facility] CM/SW Contact  Jacalyn Lefevre Edson Snowball, RN Phone Number: 07/26/2021, 10:26 AM  Clinical Narrative:    Patient from home with husband and has two sons.   See previous note.   PT recommending home health PT and 3 in 1 , patient in agreement .    Patient requesting walker. PAtient has rollator at home but it has been sitting outside and she prefers rolling walking. Explain will need PT recommendation. Secure chatted PT. Patient voiced understanding.   No preference in [facility] agency.   Anderson Malta with [facility] reviewing referral. Await determination    Anderson Malta accepted referral, asking for orders for [facility] and HHPT, and face to face. Secure chatted MD   PT recommending walker and 3 in1 , both ordered through [place]   Expected Discharge Plan: Home/Self Care Barriers to Discharge: Continued Medical Work up  Expected Discharge Plan and Services Expected Discharge Plan: Home/Self Care     Post Acute Care Choice: NA Living arrangements for the past 2 months: Apartment                 DME Arranged: N/A DME Agency: NA       HH Arranged: NA           Social Determinants of Health (SDOH) Interventions    Readmission Risk Interventions     View : No data to display.

## 2021-07-26 NOTE — Progress Notes (Signed)
Pt refused milk and molasses enema, says she would rather get it during the day instead of night. Educated pt and notified on call MD.

## 2021-07-26 NOTE — Progress Notes (Signed)
PROGRESS NOTE    Kristen [name]  NGE:[id number] DOB: 1945/03/29 DOA: 07/22/2021 PCP: [name], MD   Brief Narrative:  HPI per Dr. Inda Merlin on 07/22/21 76 year old female with past medical history of coronary artery disease status post CABG, paroxysmal atrial fibrillation, hyperlipidemia, insulin-dependent diabetes mellitus type 2, systolic and diastolic congestive heart failure (Echo 05/2021 EF 25-30% with G1DD), gastroesophageal reflux disease, remote history of prior stroke and left bundle branch block who presents to [facility] emergency department via EMS with complaints of constipation and rectal pain.   Patient explains that for the past several weeks she has had been experiencing increasingly worsening constipation.  Patient states that when she does move her bowels her bowel movements are very small.  As the patient's symptoms have worsened patient is developed progressively worsening lower abdominal and rectal pain.  Patient describes his pain as sore in quality, worse with movement and quite severe with attempts at moving her bowels.  Patient denies any associated fevers, sick contacts, recent travel.   Patient symptoms continue to worsen and she eventually presented to [facility] emergency department the evening of 5/17.  At that time patient underwent fecal disimpaction by the emergency department provider and was discharged home on oral senna and docusate.   In the following 24 hours patient continued to have worsening lower abdominal pain that became more and more severe.  This came associated with low back pain and a sensation of dysuria with bouts of urinary incontinence.  Due to persisting symptoms the patient presented again to [facility] emergency permit via EMS for evaluation.   Upon evaluation in the emergency department CT imaging of the abdomen and pelvis were performed revealing no evidence of perforation but identifying distention  of the rectum with rectal wall thickening and enhancement consistent with stercoral colitis.  A repeat attempt at disimpaction was performed followed by administration of a smog enema.  Due to minimal improvement with these interventions and ongoing substantial symptoms ER providers requesting hospitalist group evaluate patient for admission to the hospital.  **Interim History  She was having 2 liquidy bowel movements now and still complaining of some abdominal discomfort.  Now her bowel movements have stopped and she is concerned about constipation again given difficulty going again so we will give her another enema.  A smog enema was ordered however pharmacy informing that they are on backorder so we will give her milk of molasses enema this was ordered yesterday to be given but was not given last night this patient refuses will be given today.  Her main concern is now burning discomfort in her urine.  Urinalysis was obtained and urine culture still pending and showed multiple species so we will recollect. Will obtain PT/OT to evaluate and treat and recommending home health again  She continues to be somewhat dizzy and we will continue her on meclizine but if she continues to be further dizzy will further work-up with imaging.  She is also complaining about some mild dyspnea and right foot pain so we will get chest x-rays of both.  Chest x-ray showed no active disease foot x-ray showed "Mild hallux valgus deformity with associated mild first MTP joint osteoarthritis."   Assessment and Plan: * Fecal impaction in rectum [facility]) Patient presenting with substantial abdominal pain and several weeks of constipation Likely primarily due to ongoing opiate use with noncompliance with bowel regimen use possibly exacerbated by relative constipation due to ongoing diuretics Patient is already undergone  2 attempts at fecal disimpaction without significant improvement; having liquidy bowel movements but could be  concern for overflow diarrhea Was given a mineral oil enema with MiraLAX and Senna-Docusate overnight.  Encouraging patient to minimize opiate use is much as possible, patient is on a substantial regimen of oxycodone. Once impaction has resolved, patient would likely benefit from initiation of Relistor or Amitiza If not improving further will consider Gastroenterology Evaluation  -Repeat KUB this afternoon showed "Bowel gas pattern is nonspecific. Small amount of stool is seen in the colon. There is no demonstrable fecal impaction in the rectum. Surgical clips are seen in the right upper quadrant. No abnormal masses or calcifications are seen in the abdomen. Kidneys are partly obscured by bowel contents. There is contrast in the urinary bladder from previous CT. Phleboliths are seen in the pelvis." -Patient had a bowel movement yesterday however she states that she is having normal bowel movements and is concerned about constipation having lower abdominal pain.  We will give her milk of molasses enema given that smog enema is on backorder -She will need adequate bowel regimen for discharge -We will continue laxatives with stool softeners as well as enema today  Stercoral colitis See assessment and plan above  Chronic combined systolic and diastolic CHF (congestive heart failure) (HCC) No clinical evidence of cardiogenic volume overload Continue home regimen of diuretics including Furosemide 10 mg po Daily and Spironolactone 25 mg po daily and Sacubitril-Valsartan 49-51 1 tab po BID    Paroxysmal atrial fibrillation (HCC) Currently rate controlled. Continue home regimen of anticoagulation with Coumadin with Pharmacy  Continue home regimen of rate controlling agent with metoprolol twice daily Continue Monitoring on Telemetry   Coronary artery disease involving native coronary artery of native heart with angina pectoris [facility]) Patient is currently chest pain free Continue Monitoring patient on  telemetry Continue home regimen of lipid lowering therapy and AV nodal blocking therapy   Type 2 diabetes mellitus without complication, with long-term current use of insulin (HCC) Due to tenuous oral intake placing patient on a somewhat reduced regimen of basal insulin therapy C/w Accu-Cheks before every meal and nightly with sliding scale insulin Diabetic diet initiated Hemoglobin A1c ordered and is now 7.8 CBG's ranging from 129-175  Mixed diabetic hyperlipidemia associated with type 2 diabetes mellitus ([place]) Continuing home regimen of lipid lowering therapy with Atorvastatin 40 mg po qHS   GERD without esophagitis Continuing home regimen of daily PPI therapy Pantoprazole 40 mg po daily.   Hypoalbuminemia -Patient's Albumin level is now gone from 3.1 -> 3.0 -Continue to monitor and trend and repeat CMP in a.m.  Abnormal LFTs - LFTs are slightly elevated yesterday and AST trended up to 79 and ALT went to 55; now AST is 51 and ALT is 57 -Continue to monitor and trend if necessary will obtain right upper quadrant ultrasound as well as an acute hepatitis panel -Repeat CMP in a.m.  Vertigo -Patient complains of Dizziness -C/w Home Meclizine but she continues to be significantly dizzy may consider head imaging  Burning with urination - Seen urinalysis initially hazy appearance with greater than 500 glucose, moderate hemoglobin, 5 ketones, negative leukocytes, negative nitrites and rare bacteria with 6-10 RBCs per high-power field; urine culture showing multiple species so will recollect  -Recollection has been ordered but not obtained  Hyperbilirubinemia -Patient's T Bili is now 1.5 -> 0.8 and is 1.0 today -Continue to Monitor and Replete as Necessary -Repeat CMP in the AM   Hypokalemia -Patient's K+  is now 3.0 yesterday and today is 4.1 -Replete with po KCl 40 mEQ BID x2 yesterday -Continue to Monitor and Replete as Necessary -Repeat CMP in the AM   Obesity with body mass  index (BMI) of 02.7 to 25.3 -Complicates overall prognosis and care -Estimated body mass index is 33.07 kg/m as calculated from the following:   Height as of this encounter: '5\' 1"'$  (1.549 m).   Weight as of this encounter: 79.4 kg.  -Weight Loss and Dietary Counseling given   DVT prophylaxis: SCDs, Anticoagulated with Coumadin    Code Status: DNR Family Communication: No family present at bedside  Disposition Plan:  Level of care: Telemetry Medical Status is: Observation The patient will require care spanning > 2 midnights and should be moved to inpatient because: Still constipated and did not have a bowel movement so will given the Milk and Molasses Enema today  Consultants:  None  Procedures:  None  Antimicrobials:  Anti-infectives (From admission, onward)    None       Subjective: Seen And examined at bedside and was worried about her weight going up. Appears to have not had a bowel movement again and refused enema yesterday given that is going to be given in the evening.  Agreeable to take it this morning.  No nausea or vomiting. Complaining of some Right Foot Pain. No other concerns or complaints at this time.   Objective: Vitals:   07/26/21 0844 07/26/21 0845 07/26/21 1730 07/26/21 1731  BP:  (!) 107/59  113/60  Pulse:  84  85  Resp:  18  18  Temp: 97.6 F (36.4 C)  (!) 97.5 F (36.4 C)   TempSrc: Oral  Oral   SpO2:  96%  97%  Weight:      Height:        Intake/Output Summary (Last 24 hours) at 07/26/2021 1819 Last data filed at 07/26/2021 1700 Gross per 24 hour  Intake 180 ml  Output 1100 ml  Net -920 ml   Filed Weights   07/22/21 1338 07/25/21 0431 07/26/21 0500  Weight: 79.4 kg 80.1 kg 81.6 kg   Examination: Physical Exam:  Constitutional: WN/WD obese older appearing than stated age AAF in NAD appears a little anxious Respiratory: Diminished to auscultation bilaterally, no wheezing, rales, rhonchi or crackles. Normal respiratory effort and  patient is not tachypenic. No accessory muscle use. Unlabored breathing  Cardiovascular: RRR, no murmurs / rubs / gallops. S1 and S2 auscultated. No appreciable LE edema Abdomen: Soft, non-tender, Distended 2/2 body habitus. Bowel sounds positive.  GU: Deferred. Musculoskeletal: No clubbing / cyanosis of digits/nails. No joint deformity upper and lower extremities.  Neurologic: CN 2-12 grossly intact with no focal deficits. Romberg sign and cerebellar reflexes not assessed.  Psychiatric: Normal judgment and insight. Alert and oriented x 3. Normal mood and appropriate affect.   Data Reviewed: I have personally reviewed following labs and imaging studies  CBC: Recent Labs  Lab 07/22/21 1513 07/23/21 0235 07/24/21 0352 07/25/21 0619 07/26/21 0028  WBC 8.4 10.2 7.1 5.4 5.6  NEUTROABS  --  6.9 3.2 2.3 2.1  HGB 12.5 12.2 12.4 12.3 12.0  HCT 39.4 38.6 37.7 38.4 38.1  MCV 85.7 85.0 84.0 85.9 85.8  PLT 388 370 335 299 664   Basic Metabolic Panel: Recent Labs  Lab 07/22/21 1513 07/23/21 0235 07/24/21 0352 07/25/21 0619 07/26/21 0028  NA 141 142 139 139 136  K 4.0 3.4* 3.0* 4.1 3.8  CL 111 111 107 108  108  CO2 21* 21* 23 24 20*  GLUCOSE 149* 117* 137* 107* 133*  BUN '13 14 16 15 9  '$ CREATININE 0.92 0.86 0.92 0.92 0.68  CALCIUM 9.5 9.2 9.1 9.2 9.3  MG  --  2.3 2.1 1.9 1.9  PHOS  --   --  3.7 4.0 3.0   GFR: Estimated Creatinine Clearance: 60.6 mL/min (by C-G formula based on SCr of 0.68 mg/dL). Liver Function Tests: Recent Labs  Lab 07/21/21 2254 07/23/21 0235 07/24/21 0352 07/25/21 0619 07/26/21 0028  AST 20 16 79* 51* 35  ALT 18 17 55* 57* 50*  ALKPHOS 53 49 43 49 48  BILITOT 1.0 1.5* 0.8 1.0 0.6  PROT 7.2 6.8 6.6 6.5 6.7  ALBUMIN 3.6 3.2* 3.1* 3.0* 2.9*   Recent Labs  Lab 07/21/21 2254  LIPASE 23   No results for input(s): AMMONIA in the last 168 hours. Coagulation Profile: Recent Labs  Lab 07/21/21 2254 07/23/21 0235 07/24/21 0352 07/25/21 0619  07/26/21 0028  INR 2.1* 2.1* 2.3* 2.2* 2.2*   Cardiac Enzymes: No results for input(s): CKTOTAL, CKMB, CKMBINDEX, TROPONINI in the last 168 hours. BNP (last 3 results) No results for input(s): PROBNP in the last 8760 hours. HbA1C: No results for input(s): HGBA1C in the last 72 hours. CBG: Recent Labs  Lab 07/25/21 1610 07/25/21 2117 07/26/21 0840 07/26/21 1215 07/26/21 1727  GLUCAP 159* 192* 152* 187* 153*   Lipid Profile: No results for input(s): CHOL, HDL, LDLCALC, TRIG, CHOLHDL, LDLDIRECT in the last 72 hours. Thyroid Function Tests: No results for input(s): TSH, T4TOTAL, FREET4, T3FREE, THYROIDAB in the last 72 hours. Anemia Panel: No results for input(s): VITAMINB12, FOLATE, FERRITIN, TIBC, IRON, RETICCTPCT in the last 72 hours. Sepsis Labs: No results for input(s): PROCALCITON, LATICACIDVEN in the last 168 hours.  Recent Results (from the past 240 hour(s))  Urine Culture     Status: Abnormal   Collection Time: 07/22/21  2:38 PM   Specimen: Urine, Clean Catch  Result Value Ref Range Status   Specimen Description URINE, CLEAN CATCH  Final   Special Requests   Final    NONE Performed at [facility], [address] [place], [place] [address]    Culture MULTIPLE SPECIES PRESENT, SUGGEST RECOLLECTION (A)  Final   Report Status 07/24/2021 FINAL  Final   \ Radiology Studies: DG CHEST PORT 1 VIEW  Result Date: 07/26/2021 CLINICAL DATA:  Shortness of breath. EXAM: PORTABLE CHEST 1 VIEW COMPARISON:  Chest x-ray dated Jul 05, 2021. FINDINGS: The patient is rotated to the left. The heart size and mediastinal contours are within normal limits. Prior CABG. Normal pulmonary vascularity. Low lung volumes with minimal bibasilar atelectasis superimposed on chronic mild lingular scarring. No focal consolidation, pleural effusion, or pneumothorax. No acute osseous abnormality. IMPRESSION: 1. No active disease. Electronically Signed   By: Titus Dubin M.D.   On: 07/26/2021  11:48   DG Foot 2 Views Right  Result Date: 07/26/2021 CLINICAL DATA:  Right foot pain. EXAM: RIGHT FOOT - 2 VIEW COMPARISON:  None Available. FINDINGS: No acute fracture or dislocation. Mild hallux valgus deformity with associated mild first MTP joint osteoarthritis. Mild dorsal navicular degenerative spurring. Joint spaces are preserved. Bone mineralization is normal. Large plantar enthesophyte. Soft tissues are unremarkable. IMPRESSION: 1. Mild hallux valgus deformity with associated mild first MTP joint osteoarthritis. Electronically Signed   By: Titus Dubin M.D.   On: 07/26/2021 11:50     Scheduled Meds:  ALPRAZolam  0.5  mg Oral BID   ALPRAZolam  1 mg Oral Q0600   atorvastatin  40 mg Oral QHS   diclofenac Sodium  2 g Topical QID   furosemide  20 mg Oral q morning   gabapentin  300 mg Oral TID   insulin aspart  0-15 Units Subcutaneous TID AC & HS   insulin glargine-yfgn  24 Units Subcutaneous QHS   metoprolol succinate  100 mg Oral BID   milk and molasses  1 enema Rectal Once   pantoprazole  40 mg Oral Daily   phenazopyridine  100 mg Oral TID WC   polyethylene glycol  17 g Oral BID   potassium chloride  40 mEq Oral BID   sacubitril-valsartan  1 tablet Oral BID   senna-docusate  1 tablet Oral BID   sodium chloride flush  3 mL Intravenous Q12H   spironolactone  25 mg Oral Daily   thiamine  100 mg Oral Daily   Warfarin - Pharmacist Dosing Inpatient   Does not apply q1600   Continuous Infusions:   LOS: 0 days   Raiford Noble, DO [facility] Available via Epic secure chat 7am-7pm After these hours, please refer to coverage provider listed on [email or web address] 07/26/2021, 6:19 PM

## 2021-07-26 NOTE — Assessment & Plan Note (Signed)
-  Checked DG Foot and showed "Mild hallux valgus deformity with associated mild first MTP joint osteoarthritis." -Pain is improved and will need outpatient follow-up with PCP

## 2021-07-26 NOTE — Progress Notes (Signed)
ANTICOAGULATION CONSULT NOTE   Pharmacy Consult for warfarin Indication: atrial fibrillation  Allergies  Allergen Reactions   Metformin And Related Other (See Comments)    Must take XR form only, cannot tolerate Regular release metformin   Cleocin [Clindamycin Hcl] Diarrhea   Codeine Itching   Doxycycline Hyclate Diarrhea   Macrolides And Ketolides Diarrhea   Morphine Hives   Pentazocine Lactate Itching and Nausea And Vomiting    (GENERIC- [place])   Vibramycin [Doxycycline Calcium] Diarrhea and Rash   Clindamycin Diarrhea   Definity [Perflutren Lipid Microsphere] Other (See Comments)    Patient complained of warm sensation in chest x few seconds duration when injected Definity.No other symptoms   Doxycycline Diarrhea   Metformin Other (See Comments)   Pentazocine Nausea Only   Perflutren Other (See Comments)   Perflutren Lipid Microspheres Other (See Comments)   Sulfa Antibiotics Diarrhea and Rash   Sulfonamide Derivatives Rash    Patient Measurements: Height: '5\' 1"'$  (154.9 cm) Weight: 81.6 kg (179 lb 14.3 oz) IBW/kg (Calculated) : 47.8  Vital Signs: Temp: 97.6 F (36.4 C) (05/22 0844) Temp Source: Oral (05/22 0844) BP: 107/59 (05/22 0845) Pulse Rate: 84 (05/22 0845)  Labs: Recent Labs    07/24/21 0352 07/25/21 0619 07/26/21 0028  HGB 12.4 12.3 12.0  HCT 37.7 38.4 38.1  PLT 335 299 321  LABPROT 25.1* 24.5* 23.8*  INR 2.3* 2.2* 2.2*  CREATININE 0.92 0.92 0.68     Estimated Creatinine Clearance: 60.6 mL/min (by C-G formula based on SCr of 0.68 mg/dL).   Medical History: Past Medical History:  Diagnosis Date   Arthritis    Blood transfusion without reported diagnosis    Bursitis    CAD (coronary artery disease)    a. s/p CABG 2004.b. stable cath 2014 demonstrating stable CAD and continued patency of her LIMA graft.   Chronic anticoagulation    on coumadin   DDD (degenerative disc disease), lumbar    Depression    Diabetes mellitus    Diastolic  dysfunction    per echo in October 2012 with EF 50 to 55%   Fibromyalgia    GERD (gastroesophageal reflux disease) 10/23/2003   Headache(784.0)    Hyperlipidemia    Hypertension    Hypokalemia    LBBB (left bundle branch block)    Lumbar back pain    LV dysfunction    a. EF 45% by cath 2014. b. EF 50-55% by technically difficult echo in 08/2014.   Lymphadenitis    Morbid obesity ([place])    a. Sleep study negative for significant OSA in 11/2014.   PAF (paroxysmal atrial fibrillation) ([place])    Stroke [facility]) 2004   affected speech per pt    Assessment: 76 yo F here for rectal pain and constipation. On warfarin PTA for afib. PTA regimen 4.5 mg Monday and 3 mg on all other days. Admission INR 2.1.   INR 2.2 - therapeutic, CBC stable  Goal of Therapy:  INR 2-3 Monitor platelets by anticoagulation protocol: Yes   Plan:  Warfarin 4.5 mg tonight INR daily   Barth Kirks, PharmD, BCCCP Clinical Pharmacist  Please check AMION for all [place] numbers  07/26/2021 10:36 AM

## 2021-07-26 NOTE — Progress Notes (Signed)
Physical Therapy Treatment Patient Details Name: Kristen [name] MRN: [id number] DOB: 08-27-1945 Today's Date: 07/26/2021   History of Present Illness 76 y.o. female presents to [facility] hospital on 07/22/2021 with complaints of constipation and rectal pain. CT abdomen reveals distention of the rectum with rectal wall thickening and enhancement consistent with stercoral colitis. PMH includes CAD s/p CABG, PAF, HLD, DMII, CHF, GERD, CVA.    PT Comments    Patient received in bed, she is agreeable to PT session. Some paranoia regarding crumbs in bed, thinking they are bugs. She reports dizziness, R foot pain and bottom sore. She is mod independent with bed mobility, min guard for transfers and ambulation in room 45 feet with RW. She will continue to benefit from skilled PT while here to improve strength and functional independence.       Recommendations for follow up therapy are one component of a multi-disciplinary discharge planning process, led by the attending physician.  Recommendations may be updated based on patient status, additional functional criteria and insurance authorization.  Follow Up Recommendations  Home health PT     Assistance Recommended at Discharge Intermittent Supervision/Assistance  Patient can return home with the following A little help with walking and/or transfers;A little help with bathing/dressing/bathroom;Assistance with cooking/housework;Assist for transportation;Help with stairs or ramp for entrance   Equipment Recommendations  Rolling walker (2 wheels);BSC/3in1    Recommendations for Other Services       Precautions / Restrictions Precautions Precautions: Fall Restrictions Weight Bearing Restrictions: No     Mobility  Bed Mobility Overal bed mobility: Modified Independent Bed Mobility: Supine to Sit     Supine to sit: Modified independent (Device/Increase time)     General bed mobility comments: increased time but no assist needed     Transfers Overall transfer level: Needs assistance Equipment used: Rolling walker (2 wheels) Transfers: Sit to/from Stand Sit to Stand: Min guard           General transfer comment: Cues for hand placement needed    Ambulation/Gait Ambulation/Gait assistance: Min guard Gait Distance (Feet): 45 Feet Assistive device: Rolling walker (2 wheels) Gait Pattern/deviations: Step-to pattern, Decreased step length - right, Decreased step length - left, Decreased stride length Gait velocity: reduced     General Gait Details: slow cadence. R foot pain with ambulation ( being xrayed currently). Weak and required one seated rest during ambulation.   Stairs             Wheelchair Mobility    Modified Rankin (Stroke Patients Only)       Balance Overall balance assessment: Needs assistance Sitting-balance support: Feet supported Sitting balance-Leahy Scale: Good     Standing balance support: Bilateral upper extremity supported, During functional activity, Reliant on assistive device for balance Standing balance-Leahy Scale: Fair Standing balance comment: no lob, min guard for safety                            Cognition Arousal/Alertness: Awake/alert Behavior During Therapy: Flat affect Overall Cognitive Status: No family/caregiver present to determine baseline cognitive functioning                                 General Comments: Pleasant, flat affect and benefits from cues for problem solving. Pt with some paranoid behaviors noted, often seeing a small speck of lint, etc and thinking it was a bed bug. Reports she  thought she had bugs in her stool. Slow processing and response time.        Exercises      General Comments        Pertinent Vitals/Pain Pain Assessment Pain Assessment: Faces Faces Pain Scale: Hurts little more Pain Location: R foot Pain Descriptors / Indicators: Sore Pain Intervention(s): Monitored during session     Home Living                          Prior Function            PT Goals (current goals can now be found in the care plan section) Acute Rehab PT Goals Patient Stated Goal: to reduce pain and improve strength, return to independence PT Goal Formulation: With patient Time For Goal Achievement: 08/07/21 Potential to Achieve Goals: Good Progress towards PT goals: Progressing toward goals    Frequency    Min 3X/week      PT Plan Current plan remains appropriate    Co-evaluation              AM-PAC PT "6 Clicks" Mobility   Outcome Measure  Help needed turning from your back to your side while in a flat bed without using bedrails?: A Little Help needed moving from lying on your back to sitting on the side of a flat bed without using bedrails?: A Little Help needed moving to and from a bed to a chair (including a wheelchair)?: A Little Help needed standing up from a chair using your arms (e.g., wheelchair or bedside chair)?: A Little Help needed to walk in hospital room?: A Little Help needed climbing 3-5 steps with a railing? : A Lot 6 Click Score: 17    End of Session   Activity Tolerance: Patient tolerated treatment well;Patient limited by fatigue Patient left: in chair;with call bell/phone within reach Nurse Communication: Mobility status;Other (comment) (pure wick not on) PT Visit Diagnosis: Other abnormalities of gait and mobility (R26.89);Muscle weakness (generalized) (M62.81);Pain Pain - Right/Left: Right Pain - part of body: Ankle and joints of foot     Time: 9476-5465 PT Time Calculation (min) (ACUTE ONLY): 36 min  Charges:  $Gait Training: 23-37 mins                     Alanys Godino, PT, GCS 07/26/21,11:29 AM

## 2021-07-27 ENCOUNTER — Ambulatory Visit: Payer: Medicare Other

## 2021-07-27 ENCOUNTER — Telehealth: Payer: Medicare Other

## 2021-07-27 DIAGNOSIS — R7989 Other specified abnormal findings of blood chemistry: Secondary | ICD-10-CM | POA: Diagnosis not present

## 2021-07-27 DIAGNOSIS — E8809 Other disorders of plasma-protein metabolism, not elsewhere classified: Secondary | ICD-10-CM

## 2021-07-27 DIAGNOSIS — K5641 Fecal impaction: Secondary | ICD-10-CM | POA: Diagnosis not present

## 2021-07-27 DIAGNOSIS — K219 Gastro-esophageal reflux disease without esophagitis: Secondary | ICD-10-CM | POA: Diagnosis not present

## 2021-07-27 DIAGNOSIS — R300 Dysuria: Secondary | ICD-10-CM | POA: Diagnosis not present

## 2021-07-27 DIAGNOSIS — I25119 Atherosclerotic heart disease of native coronary artery with unspecified angina pectoris: Secondary | ICD-10-CM | POA: Diagnosis not present

## 2021-07-27 DIAGNOSIS — I5042 Chronic combined systolic (congestive) and diastolic (congestive) heart failure: Secondary | ICD-10-CM | POA: Diagnosis not present

## 2021-07-27 LAB — CBC WITH DIFFERENTIAL/PLATELET
Abs Immature Granulocytes: 0.01 10*3/uL (ref 0.00–0.07)
Basophils Absolute: 0.1 10*3/uL (ref 0.0–0.1)
Basophils Relative: 1 %
Eosinophils Absolute: 0.1 10*3/uL (ref 0.0–0.5)
Eosinophils Relative: 2 %
HCT: 39.1 % (ref 36.0–46.0)
Hemoglobin: 12 g/dL (ref 12.0–15.0)
Immature Granulocytes: 0 %
Lymphocytes Relative: 57 %
Lymphs Abs: 2.8 10*3/uL (ref 0.7–4.0)
MCH: 26.6 pg (ref 26.0–34.0)
MCHC: 30.7 g/dL (ref 30.0–36.0)
MCV: 86.7 fL (ref 80.0–100.0)
Monocytes Absolute: 0.6 10*3/uL (ref 0.1–1.0)
Monocytes Relative: 12 %
Neutro Abs: 1.4 10*3/uL — ABNORMAL LOW (ref 1.7–7.7)
Neutrophils Relative %: 28 %
Platelets: 349 10*3/uL (ref 150–400)
RBC: 4.51 MIL/uL (ref 3.87–5.11)
RDW: 17.5 % — ABNORMAL HIGH (ref 11.5–15.5)
WBC: 5 10*3/uL (ref 4.0–10.5)
nRBC: 0 % (ref 0.0–0.2)

## 2021-07-27 LAB — COMPREHENSIVE METABOLIC PANEL
ALT: 38 U/L (ref 0–44)
AST: 24 U/L (ref 15–41)
Albumin: 2.9 g/dL — ABNORMAL LOW (ref 3.5–5.0)
Alkaline Phosphatase: 48 U/L (ref 38–126)
Anion gap: 4 — ABNORMAL LOW (ref 5–15)
BUN: 7 mg/dL — ABNORMAL LOW (ref 8–23)
CO2: 23 mmol/L (ref 22–32)
Calcium: 9.2 mg/dL (ref 8.9–10.3)
Chloride: 109 mmol/L (ref 98–111)
Creatinine, Ser: 0.82 mg/dL (ref 0.44–1.00)
GFR, Estimated: >60 mL/min (ref >60)
Glucose, Bld: 92 mg/dL (ref 70–99)
Potassium: 4.4 mmol/L (ref 3.5–5.1)
Sodium: 136 mmol/L (ref 135–145)
Total Bilirubin: 0.3 mg/dL (ref 0.3–1.2)
Total Protein: 6.6 g/dL (ref 6.5–8.1)

## 2021-07-27 LAB — MAGNESIUM: Magnesium: 1.8 mg/dL (ref 1.7–2.4)

## 2021-07-27 LAB — PROTIME-INR
INR: 2.5 — ABNORMAL HIGH (ref 0.8–1.2)
Prothrombin Time: 26.7 seconds — ABNORMAL HIGH (ref 11.4–15.2)

## 2021-07-27 LAB — GLUCOSE, CAPILLARY
Glucose-Capillary: 120 mg/dL — ABNORMAL HIGH (ref 70–99)
Glucose-Capillary: 132 mg/dL — ABNORMAL HIGH (ref 70–99)

## 2021-07-27 LAB — PHOSPHORUS: Phosphorus: 3.3 mg/dL (ref 2.5–4.6)

## 2021-07-27 MED ORDER — POLYETHYLENE GLYCOL 3350 17 G PO PACK
17.0000 g | PACK | Freq: Every day | ORAL | 0 refills | Status: DC
Start: 2021-07-27 — End: 2023-03-15

## 2021-07-27 MED ORDER — MAGNESIUM SULFATE 2 GM/50ML IV SOLN
2.0000 g | Freq: Once | INTRAVENOUS | Status: AC
  Administered 2021-07-27: 2 g via INTRAVENOUS
  Filled 2021-07-27: qty 50

## 2021-07-27 MED ORDER — ONDANSETRON HCL 4 MG PO TABS: 4.0000 mg | ORAL_TABLET | Freq: Four times a day (QID) | ORAL | 0 refills | Status: DC | PRN

## 2021-07-27 MED ORDER — DICLOFENAC SODIUM 1 % EX GEL: 2.0000 g | Freq: Four times a day (QID) | CUTANEOUS | 0 refills | Status: AC

## 2021-07-27 MED ORDER — OXYBUTYNIN CHLORIDE 5 MG PO TABS: 5.0000 mg | ORAL_TABLET | Freq: Three times a day (TID) | ORAL | 0 refills | Status: DC | PRN

## 2021-07-27 MED ORDER — WARFARIN SODIUM 3 MG PO TABS
3.0000 mg | ORAL_TABLET | Freq: Once | ORAL | Status: AC
Start: 2021-07-27 — End: 2021-07-27
  Administered 2021-07-27: 3 mg via ORAL
  Filled 2021-07-27: qty 1

## 2021-07-27 MED ORDER — PANTOPRAZOLE SODIUM 40 MG PO TBEC
40.0000 mg | DELAYED_RELEASE_TABLET | Freq: Every day | ORAL | 0 refills | Status: DC
Start: 2021-07-28 — End: 2021-11-10

## 2021-07-27 MED ORDER — FUROSEMIDE 20 MG PO TABS
20.0000 mg | ORAL_TABLET | Freq: Every morning | ORAL | Status: DC
Start: 2021-07-28 — End: 2021-08-06

## 2021-07-27 NOTE — Progress Notes (Signed)
Occupational Therapy Treatment Patient Details Name: Kristen [name] MRN: [id number] DOB: [birth year] Today's Date: 07/27/2021   History of present illness 76 y.o. female presents to [facility] hospital on 07/22/2021 with complaints of constipation and rectal pain. CT abdomen reveals distention of the rectum with rectal wall thickening and enhancement consistent with stercoral colitis. PMH includes CAD s/p CABG, PAF, HLD, DMII, CHF, GERD, CVA.   OT comments  Pt making steady progress towards OT goals this session. Pt greeted seated in recliner emotional about going home as pt feels like she isn't ready to DC home and worried about the bed bug issue at home, noted that pt also perseverated on this topic during previous sessions therefore unsure about the validity of this concern. Reviewed ADL routine at home with recommendation provided on wear depends at home d/t incontinence issues. Pt demonstrated functional ADL transfers with RW at min guard level. Pt continues to present with decreased activity tolerance, impaired balance, and generalized deconditioning. Set pt up with BUE HEP for home to facilitate improved BUE strength and endurance and improve overall activity tolerance. Pt completed therex as indicated below with level 2 theraband. Pt would continue to benefit from skilled occupational therapy while admitted and after d/c to address the below listed limitations in order to improve overall functional mobility and facilitate independence with BADL participation. DC plan remains appropriate, will follow acutely per POC.      Recommendations for follow up therapy are one component of a multi-disciplinary discharge planning process, led by the attending physician.  Recommendations may be updated based on patient status, additional functional criteria and insurance authorization.    Follow Up Recommendations  Home health OT    Assistance Recommended at Discharge Intermittent Supervision/Assistance   Patient can return home with the following  A little help with bathing/dressing/bathroom;Assistance with cooking/housework   Equipment Recommendations  None recommended by OT    Recommendations for Other Services      Precautions / Restrictions Precautions Precautions: Fall Restrictions Weight Bearing Restrictions: No       Mobility Bed Mobility               General bed mobility comments: sitting in recliner    Transfers Overall transfer level: Needs assistance   Transfers: Sit to/from Stand Sit to Stand: Min guard           General transfer comment: Cues for hand placement needed; min guard for safety     Balance Overall balance assessment: Needs assistance Sitting-balance support: Feet supported Sitting balance-Leahy Scale: Good     Standing balance support: Bilateral upper extremity supported, During functional activity (preferred to lean on sink during grooming tasks d/t fatigue) Standing balance-Leahy Scale: Poor                             ADL either performed or assessed with clinical judgement   ADL Overall ADL's : Needs assistance/impaired     Grooming: Wash/dry hands;Standing;Min guard       Lower Body Bathing: Min guard;Sit to/from stand Lower Body Bathing Details (indicate cue type and reason): simulated via pericare with wipes         Toilet Transfer: Min guard;Ambulation;Rolling walker (2 wheels)   Toileting- Clothing Manipulation and Hygiene: Min guard;Sit to/from stand       Functional mobility during ADLs: Min guard;Rolling walker (2 wheels) General ADL Comments: emphasis on activity tolerance, BADL reeducation,and  dynamic balance during ADLs  Extremity/Trunk Assessment Upper Extremity Assessment Upper Extremity Assessment: Generalized weakness   Lower Extremity Assessment Lower Extremity Assessment: Defer to PT evaluation   Cervical / Trunk Assessment Cervical / Trunk Assessment: Kyphotic     Vision Baseline Vision/History: 0 No visual deficits Ability to See in Adequate Light: 0 Adequate Patient Visual Report: No change from baseline Vision Assessment?: No apparent visual deficits Additional Comments: reports she needs glasses but no insight into specific deficits   Perception Perception Perception: Within Functional Limits   Praxis Praxis Praxis: Intact    Cognition Arousal/Alertness: Awake/alert Behavior During Therapy: Anxious (liable) Overall Cognitive Status: No family/caregiver present to determine baseline cognitive functioning                                 General Comments: pt very anxious and tearful throughout session feeling like she is not ready to DC home, pt perseverating on bed bugs at home and in her hospital bed however oriented pt to "bugs" in bed only being crumbs.        Exercises General Exercises - Upper Extremity Shoulder Flexion: Strengthening, Both, 10 reps, Seated, Theraband Theraband Level (Shoulder Flexion): Level 2 (Red) Shoulder Horizontal ABduction: Strengthening, Both, 10 reps, Seated, Theraband Theraband Level (Shoulder Horizontal Abduction): Level 2 (Red) Shoulder Horizontal ADduction: Strengthening, Both, 10 reps, Seated, Theraband Theraband Level (Shoulder Horizontal Adduction): Level 2 (Red) Elbow Flexion: Strengthening, 10 reps, Seated, Theraband Theraband Level (Elbow Flexion): Level 2 (Red) Elbow Extension: Strengthening, 10 reps, Seated, Theraband Theraband Level (Elbow Extension): Level 2 (Red) Other Exercises Other Exercises: alternating punches from sitting in recliner with level 2 theraband x10 reps    Shoulder Instructions       General Comments HR 96 bpm post activity, did provide pt witih HEP for home. provided emotional support and therepeautic use of self as pt very emotional reproting having dreams that her CHF worsened.    Pertinent Vitals/ Pain       Pain Assessment Pain Assessment:  Faces Faces Pain Scale: Hurts little more Pain Location: generalized Pain Descriptors / Indicators: Grimacing Pain Intervention(s): Limited activity within patient's tolerance, Monitored during session, Repositioned  Home Living                                          Prior Functioning/Environment              Frequency  Min 2X/week        Progress Toward Goals  OT Goals(current goals can now be found in the care plan section)  Progress towards OT goals: Progressing toward goals  Acute Rehab OT Goals Patient Stated Goal: resolve bedbug issues at home OT Goal Formulation: With patient Time For Goal Achievement: 08/08/21 Potential to Achieve Goals: [place] Discharge plan remains appropriate;Frequency remains appropriate    Co-evaluation                 AM-PAC OT "6 Clicks" Daily Activity     Outcome Measure   Help from another person eating meals?: None Help from another person taking care of personal grooming?: A Little Help from another person toileting, which includes using toliet, bedpan, or urinal?: A Little Help from another person bathing (including washing, rinsing, drying)?: A Little Help from another person to put on and taking off regular upper body clothing?: A  Little Help from another person to put on and taking off regular lower body clothing?: A Little 6 Click Score: 19    End of Session Equipment Utilized During Treatment: Gait belt;Rolling walker (2 wheels)  OT Visit Diagnosis: Unsteadiness on feet (R26.81);Other abnormalities of gait and mobility (R26.89);Muscle weakness (generalized) (M62.81)   Activity Tolerance Patient tolerated treatment well   Patient Left in chair;with call bell/phone within reach   Nurse Communication          Time: 1324-4010 OT Time Calculation (min): 36 min  Charges: OT General Charges $OT Visit: 1 Visit OT Treatments $Self Care/Home Management : 23-37 mins  Harley Alto.,  COTA/L Acute Rehabilitation Services [phone]   Precious Haws 07/27/2021, 3:05 PM

## 2021-07-27 NOTE — Progress Notes (Signed)
Discharge instructions provided to patient, patient verbalizes understanding. Medications and appointments reviewed. Belongings collected, Iv removed. Patient discharged

## 2021-07-27 NOTE — TOC Progression Note (Signed)
Transition of Care [facility]) - Progression Note    Patient Details  Name: Kristen [name] MRN: [id number] Date of Birth: 1946-02-03  Transition of [facility]) CM/SW Contact  Jacalyn Lefevre Edson Snowball, RN Phone Number: 07/27/2021, 12:03 PM  Clinical Narrative:     Spoke to patient and two visitors who identified themselves as family .   Discussed personal care workers are private pay ( patient does not have medicaid) .   Discussed PT recommendations for HHPT and walker and 3 in 1 . DME at bedside.    Anise Salvo with [facility] , HHPT can start tomorrow. Anderson Malta asked for [facility] aide order they will submit to insurance for approval. Patient and family aware even if insurance approves aide may only be once a week for a hour.     Expected Discharge Plan: Home/Self Care Barriers to Discharge: Continued Medical Work up  Expected Discharge Plan and Services Expected Discharge Plan: Home/Self Care     Post Acute Care Choice: NA Living arrangements for the past 2 months: Apartment                 DME Arranged: N/A DME Agency: NA       HH Arranged: NA           Social Determinants of Health (SDOH) Interventions    Readmission Risk Interventions     View : No data to display.

## 2021-07-27 NOTE — Progress Notes (Signed)
Mobility Specialist Progress Note:   07/27/21 1230  Mobility  Activity Ambulated with assistance to bathroom  Level of Assistance Minimal assist, patient does 75% or more  Assistive Device Front wheel walker  Distance Ambulated (ft) 30 ft  Activity Response Tolerated fair  $Mobility charge 1 Mobility   Pt eager for mobility session. Pt was tearful upon arrival, states she doesn't feel ready to d/c. Pt requiring up to modA to stand, suspect self-limiting. To BR to void, pt left in chair with all needs met.   Nelta Numbers Acute Rehab Secure Chat or Office Phone: [phone]

## 2021-07-27 NOTE — Discharge Summary (Signed)
Physician Discharge Summary   Patient: Kristen [name] MRN: [id number] DOB: [birth year]  Admit date:     07/22/2021  Discharge date: 07/27/21  Discharge Physician: Raiford Noble, DO   PCP: [name], MD   Recommendations at discharge:   Follow-up with PCP within 1 to 2 weeks and repeat CBC, CMP, mag, Phos within 1 week Follow-up with cardiology Dr. Burt Knack within 1 to 2 weeks  Discharge Diagnoses: Principal Problem:   Fecal impaction in rectum [name]) Active Problems:   Stercoral colitis   Chronic combined systolic and diastolic CHF (congestive heart failure) (HCC)   Paroxysmal atrial fibrillation ([place])   Coronary artery disease involving native coronary artery of native heart with angina pectoris ([place])   Type 2 diabetes mellitus without complication, with long-term current use of insulin ([name])   Mixed diabetic hyperlipidemia associated with type 2 diabetes mellitus (HCC)   GERD without esophagitis   Obesity with body mass index (BMI) of 30.0 to 39.9   Hypokalemia   Hyperbilirubinemia   Burning with urination   Vertigo   Abnormal LFTs   Hypoalbuminemia   Right foot pain  Resolved Problems:   * No resolved [name] problems. Kristen [name] Course: HPI per Dr. Inda Merlin on 07/22/21 76 year old female with past medical history of coronary artery disease status post CABG, paroxysmal atrial fibrillation, hyperlipidemia, insulin-dependent diabetes mellitus type 2, systolic and diastolic congestive heart failure (Echo 05/2021 EF 25-30% with G1DD), gastroesophageal reflux disease, remote history of prior stroke and left bundle branch block who presents to [facility] emergency department via EMS with complaints of constipation and rectal pain.   Patient explains that for the past several weeks she has had been experiencing increasingly worsening constipation.  Patient states that when she does move her bowels her bowel movements are very small.  As the patient's  symptoms have worsened patient is developed progressively worsening lower abdominal and rectal pain.  Patient describes his pain as sore in quality, worse with movement and quite severe with attempts at moving her bowels.  Patient denies any associated fevers, sick contacts, recent travel.   Patient symptoms continue to worsen and she eventually presented to [name] emergency department the evening of 5/17.  At that time patient underwent fecal disimpaction by the emergency department provider and was discharged home on oral senna and docusate.   In the following 24 hours patient continued to have worsening lower abdominal pain that became more and more severe.  This came associated with low back pain and a sensation of dysuria with bouts of urinary incontinence.  Due to persisting symptoms the patient presented again to [name] emergency permit via EMS for evaluation.   Upon evaluation in the emergency department CT imaging of the abdomen and pelvis were performed revealing no evidence of perforation but identifying distention of the rectum with rectal wall thickening and enhancement consistent with stercoral colitis.  A repeat attempt at disimpaction was performed followed by administration of a smog enema.  Due to minimal improvement with these interventions and ongoing substantial symptoms ER providers requesting hospitalist group evaluate patient for admission to the [name].  **Interim History  She was having 2 liquidy bowel movements now and still complaining of some abdominal discomfort.  Now her bowel movements have stopped and she is concerned about constipation again given difficulty going again so we will give her another enema.  A smog enema was ordered however pharmacy informing that they are on backorder so we  will give her milk of molasses enema this was ordered yesterday to be given but was not given last night this patient refuses will be given today.  Her main  concern is now burning discomfort in her urine.  Urinalysis was obtained and urine culture still pending and showed multiple species so we will recollect. Will obtain PT/OT to evaluate and treat and recommending home health again  She continues to be somewhat dizzy and we will continue her on meclizine but if she continues to be further dizzy will further work-up with imaging.  She is also complaining about some mild dyspnea and right foot pain so we will get chest x-rays of both.  Chest x-ray showed no active disease foot x-ray showed "Mild hallux valgus deformity with associated mild first MTP joint osteoarthritis."  Dyspnea is improved and she is feeling better.  Having looser bowel movements and stable for discharge.  She will follow-up with her PCP and home health is being arranged.  She will have PT OT to follow at discharge and she will need to follow-up with her regular doctor and cardiologist within the outpatient setting in 1 to 2 weeks.  Assessment and Plan: * Fecal impaction in rectum [name]) Patient presenting with substantial abdominal pain and several weeks of constipation Likely primarily due to ongoing opiate use with noncompliance with bowel regimen use possibly exacerbated by relative constipation due to ongoing diuretics Patient is already undergone 2 attempts at fecal disimpaction without significant improvement; having liquidy bowel movements but could be concern for overflow diarrhea Was given a mineral oil enema with MiraLAX and Senna-Docusate overnight.  Encouraging patient to minimize opiate use is much as possible, patient is on a substantial regimen of oxycodone. Once impaction has resolved, patient would likely benefit from initiation of Relistor or Amitiza If not improving further will consider Gastroenterology Evaluation  -Repeat KUB this afternoon showed "Bowel gas pattern is nonspecific. Small amount of stool is seen in the she did not get the. There is no demonstrable  fecal impaction in the rectum. Surgical clips are seen in the right upper quadrant. No abnormal masses or calcifications are seen in the abdomen. Kidneys are partly obscured by bowel contents. There is contrast in the urinary bladder from previous CT. Phleboliths are seen in the pelvis." -Patient had a bowel movement yesterday however she states that she is having normal bowel movements and is concerned about constipation having lower abdominal pain.  We will give her milk of molasses enema given that smog enema is on backorder -She will need adequate bowel regimen for discharge -We will continue laxatives with stool softeners as well as enema today; she did not get small because it is on backorder and milk and molasses was not given in yesterday given that she did not take it in the evening so will be given today -She improved with the use of an enema and laxatives.  Now she is having runny stools and will cut back and discharge her with once a day MiraLAX and senna docusate nightly.  She is stable from this standpoint and can follow-up with her PCP for further evaluation -PT OT recommending home health and she will be sent home with a rolling walker and DME 3 and 1 bedside commode  Stercoral colitis See assessment and plan above  Chronic combined systolic and diastolic CHF (congestive heart failure) (HCC) No clinical evidence of cardiogenic volume overload Continue home regimen of diuretics including Furosemide 10 mg po Daily but will go back  up to 20 mg p.o. daily given my discussion with the patient and Spironolactone 25 mg po daily and Sacubitril-Valsartan 49-51 1 tab po BID  -Follow-up with Dr. Burt Knack and have diuretics adjusted if necessary in the outpatient setting -Chest x-ray today showed no active disease   Paroxysmal atrial fibrillation (HCC) Currently rate controlled. Continue home regimen of anticoagulation with Coumadin with Pharmacy  Continue home regimen of rate controlling agent  with metoprolol twice daily Continue Monitoring on Telemetry while hospitalized and can follow-up in outpatient setting with her primary cardiologist Dr. Burt Knack   Coronary artery disease involving native coronary artery of native heart with angina pectoris [name]) Patient is currently chest pain free Continue Monitoring patient on telemetry Continue home regimen of lipid lowering therapy and AV nodal blocking therapy   Type 2 diabetes mellitus without complication, with long-term current use of insulin ([place]) Due to tenuous oral intake placing patient on a somewhat reduced regimen of basal insulin therapy C/w Accu-Cheks before every meal and nightly with sliding scale insulin Diabetic diet initiated Hemoglobin A1c ordered and is now 7.8 CBG's ranging from 120-187  Mixed diabetic hyperlipidemia associated with type 2 diabetes mellitus ([place] [place]) Continuing home regimen of lipid lowering therapy with Atorvastatin 40 mg po qHS   GERD without esophagitis Continuing home regimen of daily PPI therapy Pantoprazole 40 mg po daily.   Right foot pain -Checked DG Foot and showed "Mild hallux valgus deformity with associated mild first MTP joint osteoarthritis." -Pain is improved and will need outpatient follow-up with PCP  Hypoalbuminemia -Patient's Albumin level is now gone from 3.1 -> 3.0 -> 2.9 x2 -Continue to monitor and trend and repeat CMP in a.m.  Abnormal LFTs - LFTs are slightly elevated yesterday and AST trended up to 79 and ALT went to 55; now AST is now resolved at 24 and ALT is now 38 -Continue to monitor and trend if necessary will obtain right upper quadrant ultrasound as well as an acute hepatitis panel -Repeat CMP within 1 week  Vertigo -Patient complains of Dizziness -C/w Home Meclizine but she continues to be significantly dizzy may consider head imaging but she thinks that the meclizine helps -vertigo stable and can be followed in the outpatient setting with her neurologist  and PCP  Burning with urination - Seen urinalysis initially hazy appearance with greater than 500 glucose, moderate hemoglobin, 5 ketones, negative leukocytes, negative nitrites and rare bacteria with 6-10 RBCs per high-power field; urine culture showing multiple species so will recollect  -Initiated Pyridium but now stopped; now also given oxybutynin for bladder spasms -Recollection has been ordered but not obtained yet; follow-up in outpatient setting  Hyperbilirubinemia -Patient's T Bili had peaked at 1.5 and is now 0.3 at the time of discharge -Continue to Monitor and Replete as Necessary -Repeat CMP in the AM   Hypokalemia -Patient's K+ is now improved at 4.4 -Continue to Monitor and Replete as Necessary -Repeat CMP in the AM   Obesity with body mass index (BMI) of 54.5 to 62.5 -Complicates overall prognosis and care -Estimated body mass index is 33.07 kg/m as calculated from the following:   Height as of this encounter: _0  (1.549 m).   Weight as of this encounter: 79.4 kg.  -Weight Loss and Dietary Counseling given   Pain control - [place] Controlled Substance Reporting System database was reviewed. and patient was instructed, not to drive, operate heavy machinery, perform activities at heights, swimming or participation in water activities or provide baby-sitting services while  on Pain, Sleep and Anxiety Medications; until their outpatient Physician has advised to do so again. Also recommended to not to take more than prescribed Pain, Sleep and Anxiety Medications.   Consultants: None  Procedures performed: CT scan and KUB  Disposition: Home health Diet recommendation:  Discharge Diet Orders (From admission, onward)     Start     Ordered   07/27/21 0000  Diet - low sodium heart healthy        07/27/21 1235           Cardiac and Carb modified diet DISCHARGE MEDICATION: Allergies as of 07/27/2021       Reactions   Metformin And Related Other (See  Comments)   Must take XR form only, cannot tolerate Regular release metformin   Cleocin [clindamycin Hcl] Diarrhea   Codeine Itching   Doxycycline Hyclate Diarrhea   Macrolides And Ketolides Diarrhea   Morphine Hives   Pentazocine Lactate Itching, Nausea And Vomiting   (GENERIC- [place])   Vibramycin [doxycycline Calcium] Diarrhea, Rash   Clindamycin Diarrhea   Definity [perflutren Lipid Microsphere] Other (See Comments)   Patient complained of warm sensation in chest x few seconds duration when injected Definity.No other symptoms   Doxycycline Diarrhea   Metformin Other (See Comments)   Pentazocine Nausea Only   Perflutren Other (See Comments)   Perflutren Lipid Microspheres Other (See Comments)   Sulfa Antibiotics Diarrhea, Rash   Sulfonamide Derivatives Rash        Medication List     TAKE these medications    ALPRAZolam 1 MG tablet Commonly known as: XANAX 1  qam  half  midday  half qhs What changed:  how much to take how to take this when to take this additional instructions   atorvastatin 40 MG tablet Commonly known as: LIPITOR Take 1 tablet (40 mg total) by mouth daily. What changed: when to take this   Cool Blood Glucose Test Strips test strip Generic drug: glucose blood 1 each by Other route 3 (three) times daily. Use to test blood sugar TID. DX: E11.9   diclofenac Sodium 1 % Gel Commonly known as: VOLTAREN Apply 2 g topically 4 (four) times daily.   Entresto 49-51 MG Generic drug: sacubitril-valsartan Take 1 tablet by mouth 2 (two) times daily. What changed:  how much to take additional instructions Notes to patient: Next dose is tonight 07/27/21   FreeStyle Libre 2 Reader [place] 1 Act by Does not apply route daily.   FreeStyle Libre 2 Sensor Misc 1 Act by Does not apply route daily.   furosemide 20 MG tablet Commonly known as: LASIX Take 1 tablet (20 mg total) by mouth every morning. Start taking on: Jul 28, 2021 What changed: how much to  take   gabapentin 300 MG capsule Commonly known as: NEURONTIN TAKE ONE CAPSULE BY MOUTH three times daily   Jardiance 25 MG Tabs tablet Generic drug: empagliflozin Take 1 tablet (25 mg total) by mouth daily. Via [employer] pt assistance   magnesium oxide 400 (240 Mg) MG tablet Commonly known as: MAG-OX Take 1 tablet (400 mg total) by mouth 2 (two) times daily. Take in the morning and evening.   meclizine 25 MG tablet Commonly known as: ANTIVERT Take 1 tablet (25 mg total) by mouth 3 (three) times daily as needed for dizziness.   metFORMIN 500 MG 24 hr tablet Commonly known as: GLUCOPHAGE-XR Take 3 tablets (1,500 mg total) by mouth daily with breakfast.   metoprolol succinate 100  MG 24 hr tablet Commonly known as: TOPROL-XL TAKE ONE TABLET BY MOUTH TWICE DAILY WITH OR immediately following A meal What changed: See the new instructions.   nitroGLYCERIN 0.4 MG SL tablet Commonly known as: NITROSTAT Place 1 tablet (0.4 mg total) under the tongue every 5 (five) minutes as needed for chest pain (x 3 doses). Reported on 05/08/2015   Novofine Pen Needle 32G X 6 MM Misc Generic drug: Insulin Pen Needle USE with insulin AS DIRECTED   ondansetron 4 MG tablet Commonly known as: ZOFRAN Take 1 tablet (4 mg total) by mouth every 6 (six) hours as needed for nausea.   ONE TOUCH ULTRA 2 w/Device Kit Inject 1 Act into the skin 3 (three) times daily. Use TID   onetouch ultrasoft lancets Use as instructed   oxybutynin 5 MG tablet Commonly known as: DITROPAN Take 1 tablet (5 mg total) by mouth every 8 (eight) hours as needed for bladder spasms.   oxyCODONE-acetaminophen 10-325 MG tablet Commonly known as: PERCOCET Take 1 tablet by mouth every 4 (four) hours as needed for pain.   Ozempic (2 MG/DOSE) 8 MG/3ML Sopn Generic drug: Semaglutide (2 MG/DOSE) Inject 2 mg into the skin once a week. What changed: when to take this   pantoprazole 40 MG tablet Commonly known as: PROTONIX Take 1  tablet (40 mg total) by mouth daily. Start taking on: Jul 28, 2021   polyethylene glycol 17 g packet Commonly known as: MIRALAX / GLYCOLAX Take 17 g by mouth daily.   potassium chloride SA 20 MEQ tablet Commonly known as: KLOR-CON M TAKE ONE TABLET BY MOUTH TWICE DAILY   senna-docusate 8.6-50 MG tablet Commonly known as: Senokot-S Take 1 tablet by mouth daily.   spironolactone 25 MG tablet Commonly known as: ALDACTONE TAKE ONE TABLET BY MOUTH ONCE DAILY   thiamine 100 MG tablet Take 1 tablet (100 mg total) by mouth daily.   Toujeo Max SoloStar 300 UNIT/ML Solostar Pen Generic drug: insulin glargine (2 Unit Dial) Inject 30 Units into the skin daily. What changed:  how much to take when to take this   warfarin 3 MG tablet Commonly known as: COUMADIN Take as directed. If you are unsure how to take this medication, talk to your nurse or doctor. Original instructions: TAKE 1 TABLET DAILY BY MOUTH EXCEPT TAKE  1 AND 1/2 TABLETS ON MONDAYS OR AS DIRECTED BY ANTICOAGULATION CLINIC What changed:  how much to take how to take this when to take this additional instructions               Durable Medical Equipment  (From admission, onward)           Start     Ordered   07/26/21 1335  For home use only DME Walker rolling  Once       Question Answer Comment  Walker: With [place] Wheels   Patient needs a walker to treat with the following condition Weakness      07/26/21 1335   07/26/21 1029  For home use only DME 3 n 1  Once        07/26/21 [facility] [place] Follow up.   Specialty: Home Health Services Contact information: [address] [place] [address] [phone]                Discharge Exam: Danley Danker  Weights   07/25/21 0431 07/26/21 0500 07/27/21 0244  Weight: 80.1 kg 81.6 kg 80.8 kg   Vitals:   07/27/21 0732 07/27/21 1018  BP: 97/69 128/71  Pulse: 90 76  Resp:  17   Temp: 98.5 F (36.9 C)   SpO2: (!) 17%    Examination: Physical Exam:  Constitutional: WN/WD obese AAF who is anxious but appears comfortable  Respiratory: Mildly diminsihed to auscultation bilaterally, no wheezing, rales, rhonchi or crackles. Normal respiratory effort and patient is not tachypenic. No accessory muscle use. Unlabored breathing  Cardiovascular: RRR, no murmurs / rubs / gallops. S1 and S2 auscultated. No extremity edema.  Abdomen: Soft, mildly-tender, Distended 2/2 body habitus. Bowel sounds positive.  GU: Deferred. Musculoskeletal: No clubbing / cyanosis of digits/nails. No joint deformity upper and lower extremities.  Neurologic: CN 2-12 grossly intact with no focal deficits. Psychiatric: Normal judgment and insight. Alert and oriented x 3. Anxious mood  Condition at discharge: stable  The results of significant diagnostics from this hospitalization (including imaging, microbiology, ancillary and laboratory) are listed below for reference.   Imaging Studies: DG Chest 1 View  Result Date: 07/05/2021 CLINICAL DATA:  Chest pain. EXAM: CHEST  1 VIEW COMPARISON:  02/05/2016 FINDINGS: The cardiac silhouette, mediastinal and hilar contours are normal. Mild tortuosity and calcification of the thoracic aorta. Stable surgical changes with median sternotomy wires. Streaky left basilar scarring changes. No infiltrates, edema or effusions. No pulmonary lesions. The bony thorax is intact. IMPRESSION: No acute cardiopulmonary findings. Electronically Signed   By: Marijo Sanes M.D.   On: 07/05/2021 13:56   DG Abd 1 View  Result Date: 07/24/2021 CLINICAL DATA:  Abdominal distention, constipation EXAM: ABDOMEN - 1 VIEW COMPARISON:  CT done on 07/22/2021 FINDINGS: Bowel gas pattern is nonspecific. Small amount of stool is seen in the colon. There is no demonstrable fecal impaction in the rectum. Surgical clips are seen in the right upper quadrant. No abnormal masses or calcifications  are seen in the abdomen. Kidneys are partly obscured by bowel contents. There is contrast in the urinary bladder from previous CT. Phleboliths are seen in the pelvis. IMPRESSION: Nonspecific bowel gas pattern. Small stool burden in the colon. There is no demonstrable fecal impaction in the rectum. Electronically Signed   By: Elmer Picker M.D.   On: 07/24/2021 15:05   CT ABDOMEN PELVIS W CONTRAST  Result Date: 07/22/2021 CLINICAL DATA:  Acute abdominal pain, neutropenic, concern for proctitis. EXAM: CT ABDOMEN AND PELVIS WITH CONTRAST TECHNIQUE: Multidetector CT imaging of the abdomen and pelvis was performed using the standard protocol following bolus administration of intravenous contrast. RADIATION DOSE REDUCTION: This exam was performed according to the departmental dose-optimization program which includes automated exposure control, adjustment of the mA and/or kV according to patient size and/or use of iterative reconstruction technique. CONTRAST:  124m OMNIPAQUE IOHEXOL 300 MG/ML  SOLN COMPARISON:  CT abdomen pelvis dated 07/05/2021. FINDINGS: Lower chest: There is mild atelectasis/scarring in the lingula and left lower lobe. Hepatobiliary: No focal liver abnormality is seen. Status post cholecystectomy. No biliary dilatation. Pancreas: Unremarkable. No pancreatic ductal dilatation or surrounding inflammatory changes. Spleen: Normal in size without focal abnormality. Adrenals/Urinary Tract: No suspicious finding in either adrenal gland. A benign 13 mm cyst in the left kidney is noted. No further imaging follow-up is recommended. Otherwise, the kidneys are normal, without renal calculi, focal lesion, or hydronephrosis. Bladder is unremarkable. Stomach/Bowel: The stomach appears normal. The rectum is distended with feces in there is associated rectal  wall thickening/enhancement, most consistent with stercoral colitis. The appendix is normal. No evidence of bowel perforation. There is no bowel  obstruction. Vascular/Lymphatic: Aortic atherosclerosis. No enlarged abdominal or pelvic lymph nodes. Reproductive: Status post hysterectomy. No adnexal masses. Other: No abdominal wall hernia or abnormality. No abdominopelvic ascites. Musculoskeletal: Degenerative changes are seen in the spine. IMPRESSION: Findings most consistent with stercoral colitis. Electronically Signed   By: Zerita Boers M.D.   On: 07/22/2021 18:20   DG CHEST PORT 1 VIEW  Result Date: 07/26/2021 CLINICAL DATA:  Shortness of breath. EXAM: PORTABLE CHEST 1 VIEW COMPARISON:  Chest x-ray dated Jul 05, 2021. FINDINGS: The patient is rotated to the left. The heart size and mediastinal contours are within normal limits. Prior CABG. Normal pulmonary vascularity. Low lung volumes with minimal bibasilar atelectasis superimposed on chronic mild lingular scarring. No focal consolidation, pleural effusion, or pneumothorax. No acute osseous abnormality. IMPRESSION: 1. No active disease. Electronically Signed   By: Titus Dubin M.D.   On: 07/26/2021 11:48   DG Foot 2 Views Right  Result Date: 07/26/2021 CLINICAL DATA:  Right foot pain. EXAM: RIGHT FOOT - 2 VIEW COMPARISON:  None Available. FINDINGS: No acute fracture or dislocation. Mild hallux valgus deformity with associated mild first MTP joint osteoarthritis. Mild dorsal navicular degenerative spurring. Joint spaces are preserved. Bone mineralization is normal. Large plantar enthesophyte. Soft tissues are unremarkable. IMPRESSION: 1. Mild hallux valgus deformity with associated mild first MTP joint osteoarthritis. Electronically Signed   By: Titus Dubin M.D.   On: 07/26/2021 11:50   CT Renal Stone Study  Result Date: 07/05/2021 CLINICAL DATA:  Flank pain, kidney stone suspected EXAM: CT ABDOMEN AND PELVIS WITHOUT CONTRAST TECHNIQUE: Multidetector CT imaging of the abdomen and pelvis was performed following the standard protocol without IV contrast. RADIATION DOSE REDUCTION: This exam  was performed according to the departmental dose-optimization program which includes automated exposure control, adjustment of the mA and/or kV according to patient size and/or use of iterative reconstruction technique. COMPARISON:  09/21/2005 FINDINGS: Lower chest: Included lung bases are clear.  Heart size is normal. Hepatobiliary: No focal liver abnormality is seen. Status post cholecystectomy. No biliary dilatation. Pancreas: Unremarkable. No pancreatic ductal dilatation or surrounding inflammatory changes. Spleen: Normal in size without focal abnormality. Adrenals/Urinary Tract: Subcentimeter right adrenal gland nodule stable since 2007, benign. Unremarkable left adrenal gland. Unremarkable kidneys. No solid renal lesion, stone, or hydronephrosis. No ureteral calculi identified. Urinary bladder within normal limits for the degree of distension. Stomach/Bowel: Stomach is within normal limits. Appendix appears normal. No evidence of bowel wall thickening, distention, or inflammatory changes. Colon is redundant. Vascular/Lymphatic: Scattered aortoiliac atherosclerotic calcifications without aneurysm. No abdominopelvic lymphadenopathy. Reproductive: Status post hysterectomy. No adnexal masses. Other: No free fluid. No abdominopelvic fluid collection. No pneumoperitoneum. No abdominal wall hernia. Musculoskeletal: No acute or significant osseous findings. Prior L4-5 interbody fusion. IMPRESSION: No acute abdominopelvic findings. Specifically, no evidence of obstructive uropathy. Aortic Atherosclerosis (ICD10-I70.0). Electronically Signed   By: Davina Poke D.O.   On: 07/05/2021 18:16    Microbiology: Results for orders placed or performed during the [name] encounter of 07/22/21  Urine Culture     Status: Abnormal   Collection Time: 07/22/21  2:38 PM   Specimen: Urine, Clean Catch  Result Value Ref Range Status   Specimen Description URINE, CLEAN CATCH  Final   Special Requests   Final     NONE Performed at [name], [place] [address], [place], [place] [address]  Culture MULTIPLE SPECIES PRESENT, SUGGEST RECOLLECTION (A)  Final   Report Status 07/24/2021 FINAL  Final   *Note: Due to a large number of results and/or encounters for the requested time period, some results have not been displayed. A complete set of results can be found in Results Review.   Labs: CBC: Recent Labs  Lab 07/23/21 0235 07/24/21 0352 07/25/21 0619 07/26/21 0028 07/27/21 0258  WBC 10.2 7.1 5.4 5.6 5.0  NEUTROABS 6.9 3.2 2.3 2.1 1.4*  HGB 12.2 12.4 12.3 12.0 12.0  HCT 38.6 37.7 38.4 38.1 39.1  MCV 85.0 84.0 85.9 85.8 86.7  PLT 370 335 299 321 809   Basic Metabolic Panel: Recent Labs  Lab 07/23/21 0235 07/24/21 0352 07/25/21 0619 07/26/21 0028 07/27/21 0258  NA 142 139 139 136 136  K 3.4* 3.0* 4.1 3.8 4.4  CL 111 107 108 108 109  CO2 21* 23 24 20* 23  GLUCOSE 117* 137* 107* 133* 92  BUN _0 7*  CREATININE 0.86 0.92 0.92 0.68 0.82  CALCIUM 9.2 9.1 9.2 9.3 9.2  MG 2.3 2.1 1.9 1.9 1.8  PHOS  --  3.7 4.0 3.0 3.3   Liver Function Tests: Recent Labs  Lab 07/23/21 0235 07/24/21 0352 07/25/21 0619 07/26/21 0028 07/27/21 0258  AST 16 79* 51* 35 24  ALT 17 55* 57* 50* 38  ALKPHOS 49 43 49 48 48  BILITOT 1.5* 0.8 1.0 0.6 0.3  PROT 6.8 6.6 6.5 6.7 6.6  ALBUMIN 3.2* 3.1* 3.0* 2.9* 2.9*   CBG: Recent Labs  Lab 07/26/21 1215 07/26/21 1727 07/26/21 2019 07/27/21 0727 07/27/21 1210  GLUCAP 187* 153* 174* 120* 132*    Discharge time spent: greater than 30 minutes.  Signed: Raiford Noble, DO [facility] 07/27/2021

## 2021-07-27 NOTE — Progress Notes (Signed)
ANTICOAGULATION CONSULT NOTE   Pharmacy Consult for warfarin Indication: atrial fibrillation  Allergies  Allergen Reactions   Metformin And Related Other (See Comments)    Must take XR form only, cannot tolerate Regular release metformin   Cleocin [Clindamycin Hcl] Diarrhea   Codeine Itching   Doxycycline Hyclate Diarrhea   Macrolides And Ketolides Diarrhea   Morphine Hives   Pentazocine Lactate Itching and Nausea And Vomiting    (GENERIC- [place])   Vibramycin [Doxycycline Calcium] Diarrhea and Rash   Clindamycin Diarrhea   Definity [Perflutren Lipid Microsphere] Other (See Comments)    Patient complained of warm sensation in chest x few seconds duration when injected Definity.No other symptoms   Doxycycline Diarrhea   Metformin Other (See Comments)   Pentazocine Nausea Only   Perflutren Other (See Comments)   Perflutren Lipid Microspheres Other (See Comments)   Sulfa Antibiotics Diarrhea and Rash   Sulfonamide Derivatives Rash    Patient Measurements: Height: '5\' 1"'$  (154.9 cm) Weight: 80.8 kg (178 lb 2.1 oz) IBW/kg (Calculated) : 47.8  Vital Signs: Temp: 98.5 F (36.9 C) (05/23 0732) Temp Source: Oral (05/23 0732) BP: 97/69 (05/23 0732) Pulse Rate: 90 (05/23 0732)  Labs: Recent Labs    07/25/21 0619 07/26/21 0028 07/27/21 0258  HGB 12.3 12.0 12.0  HCT 38.4 38.1 39.1  PLT 299 321 349  LABPROT 24.5* 23.8* 26.7*  INR 2.2* 2.2* 2.5*  CREATININE 0.92 0.68 0.82     Estimated Creatinine Clearance: 58.8 mL/min (by C-G formula based on SCr of 0.82 mg/dL).   Medical History: Past Medical History:  Diagnosis Date   Arthritis    Blood transfusion without reported diagnosis    Bursitis    CAD (coronary artery disease)    a. s/p CABG 2004.b. stable cath 2014 demonstrating stable CAD and continued patency of her LIMA graft.   Chronic anticoagulation    on coumadin   DDD (degenerative disc disease), lumbar    Depression    Diabetes mellitus    Diastolic  dysfunction    per echo in October 2012 with EF 50 to 55%   Fibromyalgia    GERD (gastroesophageal reflux disease) 10/23/2003   Headache(784.0)    Hyperlipidemia    Hypertension    Hypokalemia    LBBB (left bundle branch block)    Lumbar back pain    LV dysfunction    a. EF 45% by cath 2014. b. EF 50-55% by technically difficult echo in 08/2014.   Lymphadenitis    Morbid obesity ([place])    a. Sleep study negative for significant OSA in 11/2014.   PAF (paroxysmal atrial fibrillation) ([place])    Stroke [facility] 2004   affected speech per pt    Assessment: 76 yo F here for rectal pain and constipation. On warfarin PTA for afib. PTA regimen 4.5 mg Monday and 3 mg on all other days. Admission INR 2.1.   INR 2.5 - therapeutic, CBC stable. No bleeding noted.  Goal of Therapy:  INR 2-3 Monitor platelets by anticoagulation protocol: Yes   Plan:  Warfarin 3 mg PO tonight INR daily  Monitor for s/sx of bleeding   Thank you for involving pharmacy in this patient's care.  Renold Genta, PharmD, BCPS Clinical Pharmacist Clinical phone for 07/27/2021 until 3p is [phone] 07/27/2021 8:54 AM  **Pharmacist phone directory can be found on [email or web address] listed under [place]**

## 2021-07-29 ENCOUNTER — Telehealth: Payer: Self-pay | Admitting: Cardiovascular Disease

## 2021-07-29 ENCOUNTER — Other Ambulatory Visit: Payer: Medicare Other

## 2021-07-29 NOTE — Telephone Encounter (Signed)
Ok to use benadryl cream

## 2021-07-29 NOTE — Telephone Encounter (Signed)
Patient notified

## 2021-07-29 NOTE — Telephone Encounter (Signed)
Pt c/o medication issue:  1. Name of Medication: Benadryl cream  2. How are you currently taking this medication (dosage and times per day)?   3. Are you having a reaction (difficulty breathing--STAT)?   4. What is your medication issue? She wants to know if it safe to use.

## 2021-07-30 ENCOUNTER — Telehealth: Payer: Self-pay | Admitting: Internal Medicine

## 2021-07-30 NOTE — Telephone Encounter (Signed)
Patient was hospitalized for fecal impaction and was given senokot and miralax. She was d/c on 5/23 and the first two days had Bms but nothing today - also notes decrease in appetite. Would like a call back to see if there is anything she can do to get her bowels moving again. Call back number is [phone]

## 2021-07-30 NOTE — Telephone Encounter (Signed)
Spoke with pt and she was calling in to ask about a med for her constipation. Pt states she was dc from the hospital with senokot and miralax. Pt is not taking medication as instructed by ED instructions. Pt was very rude to [place] and so Caryl Pina has asked I speak with pt due her stating she needs someone who has medical license. I ask the pt more then once what is it she is needing from Dr. Ronnald Ramp. I asked her is she wanting the same medication given to her when she was Dc'd or something else? Pt states I am NOT licensed to tell her what to do or take. I reinstated to the pt I am not telling her what to take I am asking for clarity what it is she would like to be rx'd. She did not know.  **Pt was in the ED twice last week for constipation.  ** This Marijean Heath, RMA

## 2021-08-04 DIAGNOSIS — I509 Heart failure, unspecified: Secondary | ICD-10-CM | POA: Diagnosis not present

## 2021-08-04 DIAGNOSIS — Z7984 Long term (current) use of oral hypoglycemic drugs: Secondary | ICD-10-CM | POA: Diagnosis not present

## 2021-08-04 DIAGNOSIS — Z87891 Personal history of nicotine dependence: Secondary | ICD-10-CM | POA: Diagnosis not present

## 2021-08-04 DIAGNOSIS — E1159 Type 2 diabetes mellitus with other circulatory complications: Secondary | ICD-10-CM

## 2021-08-04 DIAGNOSIS — Z794 Long term (current) use of insulin: Secondary | ICD-10-CM | POA: Diagnosis not present

## 2021-08-05 ENCOUNTER — Ambulatory Visit (INDEPENDENT_AMBULATORY_CARE_PROVIDER_SITE_OTHER): Payer: Medicare Other | Admitting: *Deleted

## 2021-08-05 ENCOUNTER — Telehealth: Payer: Self-pay

## 2021-08-05 DIAGNOSIS — I5042 Chronic combined systolic (congestive) and diastolic (congestive) heart failure: Secondary | ICD-10-CM

## 2021-08-05 DIAGNOSIS — E1149 Type 2 diabetes mellitus with other diabetic neurological complication: Secondary | ICD-10-CM

## 2021-08-05 NOTE — Chronic Care Management (AMB) (Signed)
Chronic Care Management   CCM RN Visit Note  08/05/2021 Name: Kristen [name] MRN: [id number] DOB: 1945/05/24  Subjective: Kristen [name] is a 76 y.o. year old female who is a primary care patient of [name], MD. The care management team was consulted for assistance with disease management and care coordination needs.    Engaged with patient by telephone for follow up visit in response to provider referral for case management and/or care coordination services.   Consent to Services:  The patient was given information about Chronic Care Management services, agreed to services, and gave verbal consent prior to initiation of services.  Please see initial visit note for detailed documentation.  Patient agreed to services and verbal consent obtained.   Assessment: Review of patient past medical history, allergies, medications, health status, including review of consultants reports, laboratory and other test data, was performed as part of comprehensive evaluation and provision of chronic care management services.  CCM Care Plan  Allergies  Allergen Reactions   Metformin And Related Other (See Comments)    Must take XR form only, cannot tolerate Regular release metformin   Cleocin [Clindamycin Hcl] Diarrhea   Codeine Itching   Doxycycline Hyclate Diarrhea   Macrolides And Ketolides Diarrhea   Morphine Hives   Pentazocine Lactate Itching and Nausea And Vomiting    (GENERIC- [place])   Vibramycin [Doxycycline Calcium] Diarrhea and Rash   Clindamycin Diarrhea   Definity [Perflutren Lipid Microsphere] Other (See Comments)    Patient complained of warm sensation in chest x few seconds duration when injected Definity.No other symptoms   Doxycycline Diarrhea   Metformin Other (See Comments)   Pentazocine Nausea Only   Perflutren Other (See Comments)   Perflutren Lipid Microspheres Other (See Comments)   Sulfa Antibiotics Diarrhea and Rash   Sulfonamide Derivatives Rash    Outpatient Encounter Medications as of 08/05/2021  Medication Sig Note   ALPRAZolam (XANAX) 1 MG tablet 1  qam  half  midday  half qhs (Patient taking differently: Take 0.5-1 mg by mouth See admin instructions. Take 1 tablet in the morning, 1/2 tablet in the afternoon, and 1/2 tablet at night)    atorvastatin (LIPITOR) 40 MG tablet Take 1 tablet (40 mg total) by mouth daily. (Patient taking differently: Take 40 mg by mouth at bedtime.)    Blood Glucose Monitoring Suppl (ONE TOUCH ULTRA 2) w/Device KIT Inject 1 Act into the skin 3 (three) times daily. Use TID    Continuous Blood Gluc Receiver (FREESTYLE LIBRE 2 READER) DEVI 1 Act by Does not apply route daily.    Continuous Blood Gluc Sensor (FREESTYLE LIBRE 2 SENSOR) MISC 1 Act by Does not apply route daily.    diclofenac Sodium (VOLTAREN) 1 % GEL Apply 2 g topically 4 (four) times daily.    furosemide (LASIX) 20 MG tablet Take 1 tablet (20 mg total) by mouth every morning.    gabapentin (NEURONTIN) 300 MG capsule TAKE ONE CAPSULE BY MOUTH three times daily (Patient taking differently: Take 300 mg by mouth 3 (three) times daily.)    glucose blood (COOL BLOOD GLUCOSE TEST STRIPS) test strip 1 each by Other route 3 (three) times daily. Use to test blood sugar TID. DX: E11.9    insulin glargine, 2 Unit Dial, (TOUJEO MAX SOLOSTAR) 300 UNIT/ML Solostar Pen Inject 30 Units into the skin daily. (Patient taking differently: Inject 50 Units into the skin at bedtime.)    JARDIANCE 25 MG TABS tablet Take 1 tablet (25  mg total) by mouth daily. Via BI CARES pt assistance    Lancets (ONETOUCH ULTRASOFT) lancets Use as instructed    magnesium oxide (MAG-OX) 400 (240 Mg) MG tablet Take 1 tablet (400 mg total) by mouth 2 (two) times daily. Take in the morning and evening.    meclizine (ANTIVERT) 25 MG tablet Take 1 tablet (25 mg total) by mouth 3 (three) times daily as needed for dizziness.    metFORMIN (GLUCOPHAGE-XR) 500 MG 24 hr tablet Take 3 tablets (1,500 mg  total) by mouth daily with breakfast.    metoprolol succinate (TOPROL-XL) 100 MG 24 hr tablet TAKE ONE TABLET BY MOUTH TWICE DAILY WITH OR immediately following A meal (Patient taking differently: Take 100 mg by mouth in the morning and at bedtime.)    nitroGLYCERIN (NITROSTAT) 0.4 MG SL tablet Place 1 tablet (0.4 mg total) under the tongue every 5 (five) minutes as needed for chest pain (x 3 doses). Reported on 05/08/2015    NOVOFINE PEN NEEDLE 32G X 6 MM MISC USE with insulin AS DIRECTED    ondansetron (ZOFRAN) 4 MG tablet Take 1 tablet (4 mg total) by mouth every 6 (six) hours as needed for nausea.    oxybutynin (DITROPAN) 5 MG tablet Take 1 tablet (5 mg total) by mouth every 8 (eight) hours as needed for bladder spasms.    oxyCODONE-acetaminophen (PERCOCET) 10-325 MG tablet Take 1 tablet by mouth every 4 (four) hours as needed for pain.    pantoprazole (PROTONIX) 40 MG tablet Take 1 tablet (40 mg total) by mouth daily.    polyethylene glycol (MIRALAX / GLYCOLAX) 17 g packet Take 17 g by mouth daily.    potassium chloride SA (KLOR-CON) 20 MEQ tablet TAKE ONE TABLET BY MOUTH TWICE DAILY (Patient taking differently: Take 20 mEq by mouth 2 (two) times daily.)    sacubitril-valsartan (ENTRESTO) 49-51 MG Take 1 tablet by mouth 2 (two) times daily. (Patient taking differently: Take 2 tablets by mouth 2 (two) times daily. Pt currently taking 2 of the Entresto 24/26 tabs BID until they run out then changing to Entresto 49/51 1 tab BID)    Semaglutide, 2 MG/DOSE, (OZEMPIC, 2 MG/DOSE,) 8 MG/3ML SOPN Inject 2 mg into the skin once a week. (Patient taking differently: Inject 2 mg into the skin every Wednesday.)    senna-docusate (SENOKOT-S) 8.6-50 MG tablet Take 1 tablet by mouth daily. (Patient not taking: Reported on 07/22/2021) 07/22/2021: Pt didn't know she was supposed to be on this   spironolactone (ALDACTONE) 25 MG tablet TAKE ONE TABLET BY MOUTH ONCE DAILY (Patient taking differently: Take 25 mg by mouth  daily.)    thiamine 100 MG tablet Take 1 tablet (100 mg total) by mouth daily.    warfarin (COUMADIN) 3 MG tablet TAKE 1 TABLET DAILY BY MOUTH EXCEPT TAKE  1 AND 1/2 TABLETS ON MONDAYS OR AS DIRECTED BY ANTICOAGULATION CLINIC (Patient taking differently: Take 3-4.5 mg by mouth at bedtime. Take 1 tablet (3 mg) on Tue - Sun. Then take 1 and 1/2 tablet (4.5 mg) on Mon)    No facility-administered encounter medications on file as of 08/05/2021.   Patient Active Problem List   Diagnosis Date Noted   Right foot pain 07/26/2021   Abnormal LFTs 07/25/2021   Hypoalbuminemia 07/25/2021   Hypokalemia 07/23/2021   Hyperbilirubinemia 07/23/2021   Burning with urination 07/23/2021   Vertigo 07/23/2021   Stercoral colitis 07/22/2021   Fecal impaction in rectum ([place]) 07/22/2021   Weakness of extremity  07/07/2021   Delusions of parasitosis ([place]) 07/07/2021   Severe episode of recurrent major depressive disorder, with psychotic features ([place]) 06/15/2021   GAD (generalized anxiety disorder) 06/02/2021   Stage 3a chronic kidney disease ([place]) 03/17/2021   PAF (paroxysmal atrial fibrillation) ([facility])    Thiamine deficiency 12/30/2018   Vitamin B12 deficiency anemia due to intrinsic factor deficiency 12/20/2018   Chronic combined systolic and diastolic CHF (congestive heart failure) ([place]) 12/19/2018   Coronary artery disease involving native coronary artery of native heart with angina pectoris ([place]) 12/19/2018   Obesity with body mass index (BMI) of 30.0 to 39.9 01/15/2018   Chronic idiopathic constipation 12/27/2017   Type 2 diabetes mellitus without complication, with long-term current use of insulin ([place]) 10/30/2017   Eczema 08/07/2017   Insomnia secondary to chronic pain 08/04/2016   Visit for screening mammogram 04/14/2016   LV dysfunction    Routine general medical examination at a health care facility 05/03/2013   DM (diabetes mellitus), type 2 with peripheral vascular complications ([place]) 32/54/9826    PVC's/nonsustained VT 10/24/2012   H/O: CVA (cerebrovascular accident) 02/09/2012   Hypothyroidism 05/27/2011   Fibromyalgia 10/08/2010   Long term current use of anticoagulant 05/05/2010   Low back pain, non-specific 05/12/2008   Cognitive dysfunction associated with depression 01/29/2007   Osteoarthritis 11/22/2006   Mixed diabetic hyperlipidemia associated with type 2 diabetes mellitus ([place]) 09/13/2006   Coronary atherosclerosis 09/13/2006   Paroxysmal atrial fibrillation ([place]) 09/13/2006   GERD without esophagitis 09/13/2006   Hypertension associated with diabetes ([place]) 08/30/2006   Conditions to be addressed/monitored:  CHF and DMII  Care Plan : RN Care Manager Plan of Care  Updates made by Knox Royalty, RN since 08/05/2021 12:00 AM     Problem: Chronic Disease Management Needs   Priority: High     Long-Range Goal: Ongoing adherence to established plan of care for long term chronic disease management   Start Date: 01/07/2021  Expected End Date: 01/07/2022  Recent Progress: On track  Priority: High  Note:   Current Barriers:  Chronic Disease Management support and education needs related to CHF and DMII Unable to independently verbalize appropriate choices for DM soft diet- requires ongoing reinforcement / support Does not consistently adhere to provider recommendations re: following low carb/ sugar diet Ongoing dental issues- unable to afford dental care: community resource care guide referral placed for same 06/01/21: second referral placed with [facility] Team- patient has recently moved and has misplaced previously provided list of resources 07/13/21: verified by review of EHR that patient was contacted by [facility] and was provided dental resources as requested Chronic pain: followed regularly by pain management provider Recent hospital admission May 17-23, 2023 for fecal impaction: discharged from hospital to self care with home  health services through [facility] 08/05/21: Patient reports she cancelled home health visits due to ongoing episodes of loose stools at home; states she does not believe she needs home health services; states husband taking care of her post-hospital discharge Mental health history: previously active with clinical psychiatry provider 07/13/21- patient tells me she is no longer active with psychiatric provider; has been working with CCM CSW on counseling services- tells me she has the form for [facility] counseling services and will be placing in mail tomorrow: encouraged her ongoing engagement with CCM CSW for mental health needs  RNCM Clinical Goal(s):  Patient will demonstrate Ongoing health management independence CHF; DMII  through collaboration with RN Care manager, provider, and  care team.   Interventions: 1:1 collaboration with primary care provider regarding development and update of comprehensive plan of care as evidenced by provider attestation and co-signature Inter-disciplinary care team collaboration (see longitudinal plan of care) Evaluation of current treatment plan related to  self management and patient's adherence to plan as established by provider Review of patient status, including review of consultants reports, relevant laboratory and other test results, and medications completed Medications discussed:  reports continues to independently self-manage and denies current concerns/ issues/ questions around medications; endorses adherence to taking all medications as prescribed Reviewed recent hospital admission; patient verbalized good understanding of post-hospital discharge instructions and denies post-hospital discharge clinical concerns Encouraged patient to schedule sooner appointment with PCP if she develops clinical concerns post-recent hospital discharge; she reports she will consider/ do as indicated; reports "things going pretty good" since hospital discharge Reviewed upcoming  scheduled provider appointments: 08/12/21- cardiology pharmacy team; 08/13/21- MRI- brain; 08/17/21- CCM CSW; 09/28/21- PCP; 11/01/21- cardiology provider; 11/04/21- CCM pharmacy; patient confirms is aware of all and has plans to attend as scheduled Discussed plans with patient for ongoing care management follow up and provided patient with direct contact information for care management team     Diabetes Interventions: GOAL status: 08/05/21: ON TRACK; Long Term goal  Counseled on importance of regular laboratory monitoring as prescribed Confirmed patient continues monitoring and recording blood sugars at home post-hospital discharge- encouraged her ongoing home monitoring Reports general fasting ranges consistently between "120-130" Reports general post-prandial ranges consistently "in the 180's;" reports one isolated post-prandial value of "210" Reinforced previously provided education around DMII-appropriate diet; continues to report "I do best I can" to follow diabetic/ heart healthy diet;  positive reinforcement provided with encouragement to continue efforts  Lab Results  Component Value Date   HGBA1C 7.8 (H) 07/23/2021  Heart Failure Interventions:  GOAL status: 08/05/21: ON TRACK; Long Term Goal Discussed importance of daily weight and advised patient to weigh and record daily Discussed the importance of keeping all appointments with provider Reviewed with patient recent weights at home: she reports weight ranges consistently between 172-173 lbs at home, post-recent hospital discharge; weight today reported as "173 lbs": positive reinforcement provided for her ongoing adherence to daily weight monitoring at home and reinforced rationale for same Reinforced previously provided education around signs/ symptoms yellow CHF zone along with corresponding action plan-- she requires ongoing reinforcement and support; she denies signs/ symptoms today, states "breathing is okay;" denies lower extremity swelling  outside of normal/ baseline  Patient Goals/Self-Care Activities: As evidenced by review of EHR and patient reporting during CCM RN CM outreach,  Patient [place] Din will: Take medications as prescribed Attend all scheduled provider appointments Call pharmacy for medication refills Call provider office for new concerns or questions Continue to check your blood sugar twice daily and write down these values from home monitoring: check first thing in the morning before you eat and again 2 hours after a large meal The goal for your fasting blood sugar (first thing in the morning, before eating) is: between 100-120 The goal for your 2-hours after-eating blood sugar is: between 160-180 Frequently review the information you have been sent about choices of food that are good to eat when you have diabetes, continue your efforts to begin making small changes to your soft diet to decrease your sugar and carbohydrate intake at home-- it is important that you eat the right foods regularly for optimal blood sugar control Try to follow a heart healthy, low salt, low  carbohydrate and low sugar diet Continue to weigh daily and track weight on paper You reported today general weight ranges between 172-174 lbs Call cardiologist/ care providers if I gain more than 3 pounds overnight or 5 pounds in one week Watch for swelling in feet, ankles and legs every day  Stay in touch with the Clinical Social Worker and the Pharmacist at Dr. Ronnald Ramp' office  Keep up the great work preventing falls; continue to use your cane as needed  Follow Up Plan:   Telephone follow up appointment with care management team member scheduled for:  Wednesday, October 13, 2021 at 3:00 pm The patient has been provided with contact information for the care management team and has been advised to call with any health related questions or concerns      Plan: Telephone follow up appointment with care management team member scheduled for:  October 13, 2021  at 3:00 pm The patient has been provided with contact information for the care management team and has been advised to call with any health related questions or concerns   [name], RN, BSN, [place] [phone]: direct office

## 2021-08-05 NOTE — Patient Instructions (Signed)
Visit Information  Kristen [name], thank you for taking time to talk with me today. Please don't hesitate to contact me if I can be of assistance to you before our next scheduled telephone appointment  Below are the goals we discussed today:  Patient Self-Care Activities: Patient Kristen [name] will: Take medications as prescribed Attend all scheduled provider appointments Call pharmacy for medication refills Call provider office for new concerns or questions Continue to check your blood sugar twice daily and write down these values from home monitoring: check first thing in the morning before you eat and again 2 hours after a large meal The goal for your fasting blood sugar (first thing in the morning, before eating) is: between 100-120 The goal for your 2-hours after-eating blood sugar is: between 160-180 Frequently review the information you have been sent about choices of food that are good to eat when you have diabetes, continue your efforts to begin making small changes to your soft diet to decrease your sugar and carbohydrate intake at home-- it is important that you eat the right foods regularly for optimal blood sugar control Try to follow a heart healthy, low salt, low carbohydrate and low sugar diet Continue to weigh daily and track weight on paper You reported today general weight ranges between 172-174 lbs Call cardiologist/ care providers if I gain more than 3 pounds overnight or 5 pounds in one week Watch for swelling in feet, ankles and legs every day  Stay in touch with the Clinical Social Worker and the Pharmacist at Dr. Ronnald Ramp' office  Keep up the great work preventing falls; continue to use your cane as needed  Our next scheduled telephone follow up visit/ appointment is scheduled on:  Wednesday, October 13, 2021 at 3:00 pm- This is a PHONE CALL appointment  If you need to cancel or re-schedule our visit, please call [phone] and our care guide team will be happy to assist you.   I look  forward to hearing about your progress.   [name], RN, BSN, [place] [phone]: direct office  If you are experiencing a Mental Health or Home or need someone to talk to, please  call the Suicide and Crisis Lifeline: 988 call the [place] National Suicide Prevention Lifeline: (267)265-4686 or TTY: 531-521-4490 TTY 743-882-7404) to talk to a trained counselor call 1-800-273-TALK (toll free, 24 hour hotline) go to [facility] [address], [place] [phone]) call 911   Patient verbalizes understanding of instructions and care plan provided today and agrees to view in [place]. Active MyChart status and patient understanding of how to access instructions and care plan via MyChart confirmed with patient.     Magnetic Resonance Imaging Magnetic resonance imaging (MRI) is a painless test that takes pictures of the inside of your body. This test uses a strong magnet. It does not use X-rays. An MRI can show more details about a medical problem than other tests. Tell a health care provider about: Any allergies you have. All medicines you are taking. This includes vitamins, herbs, eye drops, creams, and over-the-counter medicines. Any surgeries you have had. Any medical problems you have. Any metal you may have in your body. This includes: Any new joint, such as a man-made (artificial) knee or hip. Any implanted devices, such as a pacemaker. An ear implant with metal (cochlear implant). An artificial heart valve. An object in your eye that has metal. Metal splinters. Pieces  of a bullet. A port for insulin or chemotherapy. Any tattoos you have. If you have a birth control implant, such as an IUD. Whether you are pregnant, may be pregnant, or are breastfeeding. Any fear of small spaces (claustrophobia). You may be given a medicine to help you relax. What are  the risks? Generally, this is a safe test. However, problems may occur. These include: If you have metal in your body near the area being tested, it may be hard to get clear pictures. If you are pregnant, you should avoid MRI tests during the first three months of pregnancy. If you are breastfeeding and dye will be used during your test, you may need to stop until the dye leaves your body. If dye is used, there is a risk of an allergic reaction to the dye. You can take medicines to prevent this reaction or to treat it if you have symptoms. If dye is used, it can cause damage to your kidneys. Drinking plenty of water before and after the test can help prevent this. What happens before the test? You will be asked to take off all metal. This includes: Your watch, jewelry, and other metal things. Hearing aids. Dentures. An underwire bra. Makeup. Braces and dental fillings normally are not a problem. If you are breastfeeding, ask your doctor if you need to pump before your test. You may need to stop breastfeeding for a time if dye will be used. What happens during the test?  You may be given earplugs or headphones to listen to music. The MRI machine can be noisy. You will lie down on a table. If dye will be used, an IV tube will be placed into one of your veins. Dye will be given through your IV tube. The table will slide into a tunnel. The tunnel has magnets inside of it. When you are inside the tunnel, you will still be able to talk to your doctor. The tunnel will scan your body and make images. You will be asked to lie very still. Your doctor will tell you when you can move. When all images are taken, the table will slide out of the tunnel. The test can take 30 minutes to over an hour. The test may vary among doctors and hospitals. What can I expect after test? If you were given a medicine to help you relax, you may be monitored until you leave the hospital or clinic. This includes checking  your blood pressure, heart rate, breathing rate, and blood oxygen level. If dye was used: It will leave your body through your pee (urine). This takes about a day. You may be told to drink plenty of fluids. This helps your body get rid of the dye. Do not breastfeed your child until your doctor says that this is safe. Follow these instructions at home: You may go back to your normal activities right away, or as told by your doctor. It is up to you to get your test results. Ask how to get your results when they are ready. Keep all follow-up visits. Summary Magnetic resonance imaging (MRI) is a painless test that takes pictures of the inside of your body. Dye may be used to get MRI pictures that are even more clear. Before your MRI, be sure to tell your doctor about any metal you may have in your body. Talk with your doctor about what your test results mean. This information is not intended to replace advice given to you by your health care  provider. Make sure you discuss any questions you have with your health care provider. Document Revised: 11/04/2020 Document Reviewed: 06/26/2019 Elsevier Patient Education  [place].

## 2021-08-05 NOTE — Telephone Encounter (Signed)
Pt LVM she needed to RS her coumadin clinic apt.  Contacted pt who reports she is feeling much better. Her INRs in the hospital were all in range.   Pt requested to be RS for 6/13 due to having so many apts for next week already.  Scheduled pt for 6/13 and advised if any changes or other needs to contact the coumadin clinic. Pt verbalized understanding and was appreciative.

## 2021-08-06 MED ORDER — FUROSEMIDE 20 MG PO TABS: 20.0000 mg | ORAL_TABLET | Freq: Every day | ORAL | 3 refills | Status: DC

## 2021-08-06 NOTE — Telephone Encounter (Signed)
Patient states that when she was in the hospital and they were giving her the 10 mg of furosemide and she gained 4 lb overnight. We will increase furosemide back to '20mg'$  daily.

## 2021-08-07 ENCOUNTER — Other Ambulatory Visit: Payer: Self-pay | Admitting: Internal Medicine

## 2021-08-07 DIAGNOSIS — E1151 Type 2 diabetes mellitus with diabetic peripheral angiopathy without gangrene: Secondary | ICD-10-CM

## 2021-08-07 DIAGNOSIS — M797 Fibromyalgia: Secondary | ICD-10-CM

## 2021-08-09 ENCOUNTER — Other Ambulatory Visit: Payer: Self-pay | Admitting: Cardiovascular Disease

## 2021-08-12 ENCOUNTER — Ambulatory Visit: Payer: Medicare Other | Admitting: Pharmacist

## 2021-08-12 VITALS — BP 122/76 | HR 73 | Wt 174.2 lb

## 2021-08-12 DIAGNOSIS — I5042 Chronic combined systolic (congestive) and diastolic (congestive) heart failure: Secondary | ICD-10-CM | POA: Diagnosis not present

## 2021-08-12 DIAGNOSIS — I2510 Atherosclerotic heart disease of native coronary artery without angina pectoris: Secondary | ICD-10-CM | POA: Diagnosis not present

## 2021-08-12 MED ORDER — NITROGLYCERIN 0.4 MG SL SUBL: 0.4000 mg | SUBLINGUAL_TABLET | SUBLINGUAL | 2 refills | Status: AC | PRN

## 2021-08-12 MED ORDER — ENTRESTO 97-103 MG PO TABS
1.0000 | ORAL_TABLET | Freq: Two times a day (BID) | ORAL | 3 refills | Status: DC
Start: 2021-08-12 — End: 2022-05-11

## 2021-08-12 NOTE — Patient Instructions (Signed)
If using the 24/26 mg Entresto take 4 tablets twice daily until the new strength of Entresto arrives. We will update your patient assistance to the Entresto 97/103 mg 1 pill twice daily.   Nitroglycerin has been refilled at [facility].  Continue other medications as prescribed. Continue taking blood pressure heart rate pressure at home. Please reach out to the clinic if you start to feel dizzy.  Follow up in clinic on 08/24/21 for blood pressure check and labs.

## 2021-08-12 NOTE — Progress Notes (Signed)
Patient ID: Kristen [name]                 DOB: 1946/02/13                      MRN: [id number]     HPI: Kristen [name] is a 76 y.o. female referred by Dr. Burt Knack to pharmacy clinic for HF medication management. PMH is significant for CAD s/p CABG in 2004.  Other medical problems include PAF on warfarin, stroke, obesity, DM, HTN, and LBBB. Most recent LVEF showed worsening of EF to 25-30% on 05/13/21. Her telmisartan was stopped and she was started on Entresto 24/26mg  BID. She also wore a Zio patch to rule out worsening afib as the cause of her EF decline. Monitor showed no afib. Repeat BMP stable. Pt was last seen by PharmD on 07/21/21 where Kristen [name] was increased to 49/51 mg twice daily for clinic BP of 124/63. Lasix was also decreased to 10 mg daily. Since that visit, patient has been hospitalized for constipation and fecal impaction. During the hospitalization, pt gained 4Ib overnight, so Lasix was increased back to 20 mg daily. Most recent labs inpatient were within normal limits after Entresto dose increase.  Today she returns to pharmacy clinic for further medication titration. Pt reports feeling unwell due to her recent GI symptoms. She denies significant heart failure symptoms including dizziness, lightheadedness, and fatigue. She denies palpitations and reported occasional chest pain associated with lying down that she believes to be GERD. Feels shortness of breath when walking longer distances or doing more involved tasks around the house. Pt denies any large increases in weight or fluid. Pt reports that when volume overloaded, she carries fluid around her sides rather than her ankles. Activity level is minimal due to weakness,  but she is able to complete all ADLs with use of a cane. She checks her weight at home, this morning it was 171.2 lbs with no shoes/clothes. Weight is near baseline and patient is euvolemic on exam. Appetite has been low, which is likely associated with Ozempic.    Pt reported taking Lasix 20 mg daily and  2 of the 24/26 tablets until the 49/51 mg tablets arrived as instructed. Pt is tolerating both changes well. Pt reported monitoring BP at home with numbers consistently within normal limits. BP in clinic today was within normal limits. Pt reported occasionally not taking spironolactone if she was not eating, leading to a few missed doses. Advised pt to take spironolactone regardless for CV benefit. Pt asked for new nitroglycerin prescription as her previous had expired. Denies using nitroglycerin.  Current CHF meds: Entresto 49/51mg  twice a day, spironolactone 25mg  daily, Jardiance 25mg  daily, metoprolol succinate 100mg  twice a day, furosemide 20mg  daily  BP goal: <130/44mmHg  Family History: The patient's family history includes Anxiety disorder in her maternal aunt; Arthritis in an other family member; Colon cancer in her paternal uncle; Depression in her sister; Diabetes in an other family member; Heart attack in her father; Heart disease in her father; Hypertension in an other family member; Kidney disease in an other family member; Stroke in her mother and another family member.  Social History: no tobacco or alcohol use  Diet: not discussed  Exercise: limited by back pain  Home BP readings: BP at home ranges from 120-130/70s the majority of the time, however checks infequently   Wt Readings from Last 3 Encounters:  08/12/21 174 lb 3.2 oz (79 kg)  07/27/21 178 lb  2.1 oz (80.8 kg)  07/21/21 182 lb (82.6 kg)   BP Readings from Last 3 Encounters:  08/12/21 122/76  07/27/21 128/71  07/22/21 133/76   Pulse Readings from Last 3 Encounters:  08/12/21 73  07/27/21 76  07/22/21 92    Renal function: Estimated Creatinine Clearance: 58.2 mL/min (by C-G formula based on SCr of 0.82 mg/dL).  Past Medical History:  Diagnosis Date   Arthritis    Blood transfusion without reported diagnosis    Bursitis    CAD (coronary artery disease)    a.  s/p CABG 2004.b. stable cath 2014 demonstrating stable CAD and continued patency of her LIMA graft.   Chronic anticoagulation    on coumadin   DDD (degenerative disc disease), lumbar    Depression    Diabetes mellitus    Diastolic dysfunction    per echo in October 2012 with EF 50 to 55%   Fibromyalgia    GERD (gastroesophageal reflux disease) 10/23/2003   Headache(784.0)    Hyperlipidemia    Hypertension    Hypokalemia    LBBB (left bundle branch block)    Lumbar back pain    LV dysfunction    a. EF 45% by cath 2014. b. EF 50-55% by technically difficult echo in 08/2014.   Lymphadenitis    Morbid obesity (Kristen [name])    a. Sleep study negative for significant OSA in 11/2014.   PAF (paroxysmal atrial fibrillation) ([place])    Stroke ([place]) 2004   affected speech per pt    Current Outpatient Medications on File Prior to Visit  Medication Sig Dispense Refill   ALPRAZolam (XANAX) 1 MG tablet 1  qam  half  midday  half qhs (Patient taking differently: Take 0.5-1 mg by mouth See admin instructions. Take 1 tablet in the morning, 1/2 tablet in the afternoon, and 1/2 tablet at night) 180 tablet 0   atorvastatin (LIPITOR) 40 MG tablet Take 1 tablet (40 mg total) by mouth daily. (Patient taking differently: Take 40 mg by mouth at bedtime.) 90 tablet 1   Blood Glucose Monitoring Suppl (ONE TOUCH ULTRA 2) w/Device KIT Inject 1 Act into the skin 3 (three) times daily. Use TID 1 kit 2   Continuous Blood Gluc Receiver (FREESTYLE LIBRE 2 READER) DEVI 1 Act by Does not apply route daily. 2 each 5   Continuous Blood Gluc Sensor (FREESTYLE LIBRE 2 SENSOR) MISC 1 Act by Does not apply route daily. 2 each 5   diclofenac Sodium (VOLTAREN) 1 % GEL Apply 2 g topically 4 (four) times daily. 100 g 0   furosemide (LASIX) 20 MG tablet Take 1 tablet (20 mg total) by mouth daily. 60 tablet 3   gabapentin (NEURONTIN) 300 MG capsule Take ONE capsule by MOUTH three times daily 270 capsule 1   glucose blood (COOL BLOOD GLUCOSE  TEST STRIPS) test strip 1 each by Other route 3 (three) times daily. Use to test blood sugar TID. DX: E11.9 300 each 1   insulin glargine, 2 Unit Dial, (TOUJEO MAX SOLOSTAR) 300 UNIT/ML Solostar Pen Inject 30 Units into the skin daily. (Patient taking differently: Inject 50 Units into the skin at bedtime.) 9 mL 1   JARDIANCE 25 MG TABS tablet Take 1 tablet (25 mg total) by mouth daily. Via BI CARES pt assistance 90 tablet 1   Lancets (ONETOUCH ULTRASOFT) lancets Use as instructed 100 each 12   magnesium oxide (MAG-OX) 400 (240 Mg) MG tablet Take 1 tablet (400 mg total) by mouth 2 (  two) times daily. Take in the morning and evening. 180 tablet 3   meclizine (ANTIVERT) 25 MG tablet Take 1 tablet (25 mg total) by mouth 3 (three) times daily as needed for dizziness. 270 tablet 0   metFORMIN (GLUCOPHAGE-XR) 500 MG 24 hr tablet Take 3 tablets (1,500 mg total) by mouth daily with breakfast. 270 tablet 1   metoprolol succinate (TOPROL-XL) 100 MG 24 hr tablet TAKE ONE TABLET BY MOUTH TWICE DAILY WITH OR immediately following A meal (Patient taking differently: Take 100 mg by mouth in the morning and at bedtime.) 180 tablet 1   NOVOFINE PEN NEEDLE 32G X 6 MM MISC USE with insulin AS DIRECTED 100 each 3   ondansetron (ZOFRAN) 4 MG tablet Take 1 tablet (4 mg total) by mouth every 6 (six) hours as needed for nausea. 20 tablet 0   oxybutynin (DITROPAN) 5 MG tablet Take 1 tablet (5 mg total) by mouth every 8 (eight) hours as needed for bladder spasms. 30 tablet 0   oxyCODONE-acetaminophen (PERCOCET) 10-325 MG tablet Take 1 tablet by mouth every 4 (four) hours as needed for pain.     pantoprazole (PROTONIX) 40 MG tablet Take 1 tablet (40 mg total) by mouth daily. 30 tablet 0   polyethylene glycol (MIRALAX / GLYCOLAX) 17 g packet Take 17 g by mouth daily. 14 each 0   potassium chloride SA (KLOR-CON M) 20 MEQ tablet TAKE ONE TABLET BY MOUTH EVERY MORNING and TAKE ONE TABLET BY MOUTH EVERYDAY AT BEDTIME 180 tablet 2    Semaglutide, 2 MG/DOSE, (OZEMPIC, 2 MG/DOSE,) 8 MG/3ML SOPN Inject 2 mg into the skin once a week. (Patient taking differently: Inject 2 mg into the skin every Wednesday.) 9 mL 1   senna-docusate (SENOKOT-S) 8.6-50 MG tablet Take 1 tablet by mouth daily. (Patient not taking: Reported on 07/22/2021) 30 tablet 2   spironolactone (ALDACTONE) 25 MG tablet TAKE ONE TABLET BY MOUTH ONCE DAILY (Patient taking differently: Take 25 mg by mouth daily.) 90 tablet 1   thiamine 100 MG tablet Take 1 tablet (100 mg total) by mouth daily. 90 tablet 1   warfarin (COUMADIN) 3 MG tablet TAKE 1 TABLET DAILY BY MOUTH EXCEPT TAKE  1 AND 1/2 TABLETS ON MONDAYS OR AS DIRECTED BY ANTICOAGULATION CLINIC (Patient taking differently: Take 3-4.5 mg by mouth at bedtime. Take 1 tablet (3 mg) on Tue - Sun. Then take 1 and 1/2 tablet (4.5 mg) on Mon) 100 tablet 1   No current facility-administered medications on file prior to visit.    Allergies  Allergen Reactions   Metformin And Related Other (See Comments)    Must take XR form only, cannot tolerate Regular release metformin   Cleocin [Clindamycin Hcl] Diarrhea   Codeine Itching   Doxycycline Hyclate Diarrhea   Macrolides And Ketolides Diarrhea   Morphine Hives   Pentazocine Lactate Itching and Nausea And Vomiting    (GENERIC- [place])   Vibramycin [Doxycycline Calcium] Diarrhea and Rash   Clindamycin Diarrhea   Definity [Perflutren Lipid Microsphere] Other (See Comments)    Patient complained of warm sensation in chest x few seconds duration when injected Definity.No other symptoms   Doxycycline Diarrhea   Metformin Other (See Comments)   Pentazocine Nausea Only   Perflutren Other (See Comments)   Perflutren Lipid Microspheres Other (See Comments)   Sulfa Antibiotics Diarrhea and Rash   Sulfonamide Derivatives Rash     Assessment/Plan:  1. CHF -  Pt's heart failure symptoms seem overall well controlled on  current regimen. Blood pressure is at goal of <130/80.  Will increase Entresto to 97/103 mg tablet twice daily for additional CV benefit. New prescription was sent to the patient assistance program. In the meantime, pt was instructed to take 4 of the 24/26 mg tablets twice daily and transition to 1 of the 97/103 mg tablets when the new supply arrives.  Continue spironolactone 25mg  daily, Jardiance 25mg  daily, metoprolol succinate 100mg  twice a day and furosemide 20mg  daily. Nitroglycerin refill was sent at pt request. Pt will return on 08/24/21 to evaluate BMET after Entresto dose increase and tolerability after dose increase. Pt was encouraged to reach out with any questions or concerns.  Patient seen with Reatha Harps, PharmD  Megan E. Supple, PharmD, BCACP, [place] [address] [place], [place] [address] Phone: [phone]; Fax: [phone] 08/12/2021 4:36 PM

## 2021-08-13 ENCOUNTER — Ambulatory Visit
Admission: RE | Admit: 2021-08-13 | Discharge: 2021-08-13 | Disposition: A | Discharge status: Home / Self Care | Payer: Medicare Other | Source: Ambulatory Visit | Attending: Internal Medicine | Admitting: Internal Medicine

## 2021-08-13 DIAGNOSIS — I6782 Cerebral ischemia: Secondary | ICD-10-CM | POA: Diagnosis not present

## 2021-08-13 DIAGNOSIS — I639 Cerebral infarction, unspecified: Secondary | ICD-10-CM | POA: Diagnosis not present

## 2021-08-13 DIAGNOSIS — G319 Degenerative disease of nervous system, unspecified: Secondary | ICD-10-CM | POA: Diagnosis not present

## 2021-08-13 DIAGNOSIS — R29898 Other symptoms and signs involving the musculoskeletal system: Secondary | ICD-10-CM

## 2021-08-13 DIAGNOSIS — F22 Delusional disorders: Secondary | ICD-10-CM

## 2021-08-16 ENCOUNTER — Telehealth: Payer: Self-pay | Admitting: Pharmacist

## 2021-08-16 DIAGNOSIS — M150 Primary generalized (osteo)arthritis: Secondary | ICD-10-CM | POA: Diagnosis not present

## 2021-08-16 DIAGNOSIS — Z79891 Long term (current) use of opiate analgesic: Secondary | ICD-10-CM | POA: Diagnosis not present

## 2021-08-16 DIAGNOSIS — M47816 Spondylosis without myelopathy or radiculopathy, lumbar region: Secondary | ICD-10-CM | POA: Diagnosis not present

## 2021-08-16 DIAGNOSIS — G894 Chronic pain syndrome: Secondary | ICD-10-CM | POA: Diagnosis not present

## 2021-08-16 NOTE — Telephone Encounter (Signed)
Called Entresto PAP who agreed to send Kristen [name] today that will arrive on Friday. Pt amde aware and re-educated to take only 1 tablet twice daily when the starting the 97/103 mg tablets.

## 2021-08-17 ENCOUNTER — Other Ambulatory Visit: Payer: Self-pay | Admitting: Cardiovascular Disease

## 2021-08-17 ENCOUNTER — Encounter: Payer: Self-pay | Admitting: Licensed Clinical Social Worker

## 2021-08-17 ENCOUNTER — Ambulatory Visit: Payer: Medicare Other | Admitting: Licensed Clinical Social Worker

## 2021-08-17 ENCOUNTER — Ambulatory Visit (INDEPENDENT_AMBULATORY_CARE_PROVIDER_SITE_OTHER): Payer: Medicare Other

## 2021-08-17 DIAGNOSIS — D510 Vitamin B12 deficiency anemia due to intrinsic factor deficiency: Secondary | ICD-10-CM | POA: Diagnosis not present

## 2021-08-17 DIAGNOSIS — F333 Major depressive disorder, recurrent, severe with psychotic symptoms: Secondary | ICD-10-CM

## 2021-08-17 DIAGNOSIS — Z7901 Long term (current) use of anticoagulants: Secondary | ICD-10-CM | POA: Diagnosis not present

## 2021-08-17 DIAGNOSIS — I480 Paroxysmal atrial fibrillation: Secondary | ICD-10-CM

## 2021-08-17 DIAGNOSIS — F411 Generalized anxiety disorder: Secondary | ICD-10-CM

## 2021-08-17 DIAGNOSIS — Z7189 Other specified counseling: Secondary | ICD-10-CM

## 2021-08-17 DIAGNOSIS — G894 Chronic pain syndrome: Secondary | ICD-10-CM | POA: Diagnosis not present

## 2021-08-17 DIAGNOSIS — Z79891 Long term (current) use of opiate analgesic: Secondary | ICD-10-CM | POA: Diagnosis not present

## 2021-08-17 LAB — POCT INR: INR: 1.3 — AB (ref 2.0–3.0)

## 2021-08-17 MED ORDER — CYANOCOBALAMIN 1000 MCG/ML IJ SOLN
1000.0000 ug | Freq: Once | INTRAMUSCULAR | Status: AC
  Administered 2021-08-17: 1000 ug via INTRAMUSCULAR

## 2021-08-17 NOTE — Patient Instructions (Addendum)
Pre visit review using our clinic review tool, if applicable. No additional management support is needed unless otherwise documented below in the visit note.  Increase dose today to take 1 1/2 tablets and increase dose tomorrow to take 1 1/2 tablets and then change weekly dosing to take 1 tablet daily except take 1 1/2 tablets on Mondays, Wednesdays, and Fridays. Recheck in 1 week.

## 2021-08-17 NOTE — Chronic Care Management (AMB) (Signed)
Chronic Care Management   Clinical Social Work Note  08/17/2021 Name: Kristen [name] MRN: [id number] DOB: [birth year]  Kristen [name] is a 76 y.o. year old female who is a primary care patient of Kristen Lima, MD. The CCM team was consulted to assist the patient with chronic disease management and/or care coordination needs related to:  psychotherapy .   Engaged with patient by telephone for follow up visit in response to provider referral for social work chronic care management and care coordination services.   Consent to Services:  The patient was given information about Chronic Care Management services, agreed to services, and gave verbal consent prior to initiation of services.  Please see initial visit note for detailed documentation.   Patient agreed to services and consent obtained.   Summary: Assessed patient's current treatment, progress, coping skills, support system and barriers to care.  She is making progress with managing her mental health, has completed intake with [facility].  Has next appointment June 14th . Reports Dr. Ronnald Ramp is filling her mental health medications and she does not need to connect with psychiatry.  See Care Plan below for interventions and patient self-care actives.  Follow up Plan:  All care plan goals have been met.  LCSW sill disconnect from care team after patient's office visit with Dr. Ronnald Ramp on 09/01/21.  Patient has been informed to contact the office if new needs arise.   Assessment: Review of patient past medical history, allergies, medications, and health status, including review of relevant consultants reports was performed today as part of a comprehensive evaluation and provision of chronic care management and care coordination services.     SDOH (Social Determinants of Health) assessments and interventions performed:    Advanced Directives Status: See Care Plan for related entries.  CCM Care Plan Conditions  to be addressed/monitored: Anxiety and Depression;   Care Plan : LCSW Plan of Care  Updates made by [name], LCSW since 08/17/2021 12:00 AM     Problem: Coping Skills      Goal: Coping Skills Enhanced Completed 08/17/2021  Start Date: 06/21/2021  This Visit's Progress: On track  Recent Progress: On track  Priority: High  Note:   Current Barriers:  Disease Management support and education needs related to Depression: anxiety  CSW Clinical Goal(s):  Patient  will demonstrate a reduction in symptoms related to :symptom reduction  through collaboration with Clinical Education officer, museum, provider, and care team.   Interventions: 1:1 collaboration with primary care provider regarding development and update of comprehensive plan of care as evidenced by provider attestation and co-signature Inter-disciplinary care team collaboration (see longitudinal plan of care) Evaluation of current treatment plan related to  self management and patient's adherence to plan as established by provider  Mental Health:  (Status: Goal Met. Patient declined further engagement on this goal.)  Evaluation of current treatment plan related to Depression: anxiety Active listening / Reflection utilized  Problem [place] strategies reviewed Discussed referral for psychiatry: no longer interested PCP is prescribing medication    Advance Directive (Status: New goal. Patient declined further engagement on this goal.) A voluntary discussion about advanced care planning including importance of advanced directives, healthcare proxy and living will was discussed., EMMI educational information on Advance Directives sent via e-mail , and Advance directive packet mailed     Task & activities to accomplish goals: Keep appointments with [facility]  [address] [phone]  [phone]   Continue with compliance of taking medication       Casimer Lanius, LCSW Licensed  Clinical Social Worker [facility]  [place]  [phone]

## 2021-08-17 NOTE — Patient Instructions (Addendum)
Visit Information  Thank you for taking time to visit with me today. Please don't hesitate to contact me if I can be of assistance to you before our next scheduled telephone appointment.  Following are the goals we discussed today:  Task & activities to accomplish goals: Keep appointments with [facility]  [address], [address] [phone]   Continue with compliance of taking medication        Casimer Lanius, LCSW Licensed Clinical Social Worker [facility]  [place]  [phone]     No follow up scheduled, per our conversation you do not require continued follow up I will disconnect from your care team after your office visit with Dr. Ronnald Ramp, please call the office if additional needs are identified   Please call the care guide team at [phone] if you need to cancel or reschedule your appointment.   If you are experiencing a Mental Health or [place] or need someone to talk to, please call the Suicide and Crisis Lifeline: 988 call 1-800-273-TALK (toll free, 24 hour hotline) go to [facility] [phone])   The patient verbalized understanding of instructions, educational materials, and care plan provided today and agreed to receive a mailed copy of patient instructions, educational materials, and care plan.

## 2021-08-17 NOTE — Progress Notes (Signed)
Per orders of Dr. Ronnald Ramp, injection of B12 given in L. Deltoid per pt request by Randall An. Patient tolerated injection well.

## 2021-08-17 NOTE — Progress Notes (Signed)
Increase dose today to take 1 1/2 tablets and increase dose tomorrow to take 1 1/2 tablets and then change weekly dosing to take 1 tablet daily except take 1 1/2 tablets on Mondays, Wednesdays, and Fridays. Recheck in 1 week.

## 2021-08-19 ENCOUNTER — Encounter: Payer: Self-pay | Admitting: Internal Medicine

## 2021-08-19 ENCOUNTER — Telehealth: Payer: Self-pay | Admitting: Internal Medicine

## 2021-08-19 NOTE — Telephone Encounter (Signed)
Requesting call back regarding Ozempic medication.   No further information given.

## 2021-08-20 ENCOUNTER — Other Ambulatory Visit: Payer: Self-pay | Admitting: Internal Medicine

## 2021-08-20 DIAGNOSIS — Z7901 Long term (current) use of anticoagulants: Secondary | ICD-10-CM

## 2021-08-20 NOTE — Telephone Encounter (Signed)
Pt requesting PAP app be completed for Ozempic.  I have printed out app and mailed to pt per her request.   I have filled out what was necessary from the office with the exception of PCP signature. I have also highlighted portions that would needs pt's attention.

## 2021-08-22 ENCOUNTER — Other Ambulatory Visit: Payer: Self-pay | Admitting: Internal Medicine

## 2021-08-22 DIAGNOSIS — F411 Generalized anxiety disorder: Secondary | ICD-10-CM

## 2021-08-24 ENCOUNTER — Ambulatory Visit: Payer: Medicare Other | Admitting: Pharmacist

## 2021-08-24 VITALS — BP 100/68 | HR 84 | Wt 170.0 lb

## 2021-08-24 DIAGNOSIS — I5042 Chronic combined systolic (congestive) and diastolic (congestive) heart failure: Secondary | ICD-10-CM | POA: Diagnosis not present

## 2021-08-24 NOTE — Progress Notes (Unsigned)
Patient ID: Kristen [name]                 DOB: 1945-10-28                      MRN: [id number]     HPI: Kristen [name] is a 76 y.o. female referred by Dr. Burt Knack to pharmacy clinic for HF medication management. PMH is significant for CAD s/p CABG in 2004. Other medical problems include PAF on warfarin, stroke, obesity, DM, HTN, and LBBB. Most recent LVEF showed worsening of EF to 25-30% on 05/13/21. Her telmisartan was stopped and she was started on Entresto 24/26mg  BID. She also wore a Zio patch to rule out worsening afib as the cause of her EF decline. Monitor showed no afib. Repeat BMP stable. Pt was seen by PharmD on 07/21/21 where Delene Loll was increased to 49/51 mg twice daily with clinic BP of 124/63 and Lasix was decreased to 10 mg daily. After that visit, patient was hospitalized for constipation and fecal impaction. During the hospitalization, pt gained 4Ib overnight, so Lasix was increased back to 20 mg daily. Most recent labs inpatient were within normal limits after Entresto dose increase. Pt was last seen by PharmD on 08/12/21 where Delene Loll was increased to 97/103 mg twice daily. After that appointment, patient underwent MRI of the brain showing no acute abnormalities, but a small chronic cortically-based infarct.  Today she returns to pharmacy clinic for evaluation after her recent increase in Entresto to 97/103. Pt reports receiving the new strength of Entresto in the mail and has swapped to taking a single tablet of the Entresto 97/103 mg twice daily. Pt also reports taking all medications as prescribed and no longer omits doses of spironolactone when skipping breakfast. Pt reports a recent fall about three days ago. Pt landed on her shoulder and denies any persisting pain or hitting her head. Pt denied dizziness or fatigue surrounding the fall, stating that gait instability from her CVA as the cause. Pt also denies any new fatigue, dizziness, palpitations, chest pain, swelling,  shortness of breath, or headache. Pt had questions about a tablet found in her stool and was educated that ghost tablet is typical of metformin XR. Pt continues to monitor home BP which has been well controlled in the 110s/70s.  Current CHF meds: Entresto 97/103mg  twice a day, spironolactone 25mg  daily, Jardiance 25mg  daily, metoprolol succinate 100mg  twice a day, furosemide 20mg  daily  BP goal: <130/51mmHg  Family History: The patient's family history includes Anxiety disorder in her maternal aunt; Arthritis in an other family member; Colon cancer in her paternal uncle; Depression in her sister; Diabetes in an other family member; Heart attack in her father; Heart disease in her father; Hypertension in an other family member; Kidney disease in an other family member; Stroke in her mother and another family member.  Social History: no tobacco or alcohol use  Diet: not discussed  Exercise: limited by back pain  Home BP readings: 110s/70s consistently. HR in 80s   Wt Readings from Last 3 Encounters:  08/12/21 174 lb 3.2 oz (79 kg)  07/27/21 178 lb 2.1 oz (80.8 kg)  07/21/21 182 lb (82.6 kg)   BP Readings from Last 3 Encounters:  08/12/21 122/76  07/27/21 128/71  07/22/21 133/76   Pulse Readings from Last 3 Encounters:  08/12/21 73  07/27/21 76  07/22/21 92    Renal function: CrCl cannot be calculated (Patient's most recent lab result is older  than the maximum 21 days allowed.).  Past Medical History:  Diagnosis Date   Arthritis    Blood transfusion without reported diagnosis    Bursitis    CAD (coronary artery disease)    a. s/p CABG 2004.b. stable cath 2014 demonstrating stable CAD and continued patency of her LIMA graft.   Chronic anticoagulation    on coumadin   DDD (degenerative disc disease), lumbar    Depression    Diabetes mellitus    Diastolic dysfunction    per echo in October 2012 with EF 50 to 55%   Fibromyalgia    GERD (gastroesophageal reflux disease)  10/23/2003   Headache(784.0)    Hyperlipidemia    Hypertension    Hypokalemia    LBBB (left bundle branch block)    Lumbar back pain    LV dysfunction    a. EF 45% by cath 2014. b. EF 50-55% by technically difficult echo in 08/2014.   Lymphadenitis    Morbid obesity ([place])    a. Sleep study negative for significant OSA in 11/2014.   PAF (paroxysmal atrial fibrillation) ([place])    Stroke [facility]) 2004   affected speech per pt    Current Outpatient Medications on File Prior to Visit  Medication Sig Dispense Refill   ALPRAZolam (XANAX) 1 MG tablet TAKE ONE TABLET BY MOUTH EVERY MORNING, TAKE 1/2 TABLET midday, AND TAKE 1/2 TABLET AT BEDTIME 180 tablet 0   atorvastatin (LIPITOR) 40 MG tablet Take 1 tablet (40 mg total) by mouth daily. (Patient taking differently: Take 40 mg by mouth at bedtime.) 90 tablet 1   Blood Glucose Monitoring Suppl (ONE TOUCH ULTRA 2) w/Device KIT Inject 1 Act into the skin 3 (three) times daily. Use TID 1 kit 2   Continuous Blood Gluc Receiver (FREESTYLE LIBRE 2 READER) DEVI 1 Act by Does not apply route daily. 2 each 5   Continuous Blood Gluc Sensor (FREESTYLE LIBRE 2 SENSOR) MISC 1 Act by Does not apply route daily. 2 each 5   diclofenac Sodium (VOLTAREN) 1 % GEL Apply 2 g topically 4 (four) times daily. 100 g 0   furosemide (LASIX) 20 MG tablet Take 1 tablet (20 mg total) by mouth daily. 60 tablet 3   gabapentin (NEURONTIN) 300 MG capsule Take ONE capsule by MOUTH three times daily 270 capsule 1   glucose blood (COOL BLOOD GLUCOSE TEST STRIPS) test strip 1 each by Other route 3 (three) times daily. Use to test blood sugar TID. DX: E11.9 300 each 1   insulin glargine, 2 Unit Dial, (TOUJEO MAX SOLOSTAR) 300 UNIT/ML Solostar Pen Inject 30 Units into the skin daily. (Patient taking differently: Inject 50 Units into the skin at bedtime.) 9 mL 1   JARDIANCE 25 MG TABS tablet Take 1 tablet (25 mg total) by mouth daily. Via BI CARES pt assistance 90 tablet 1   Lancets (ONETOUCH  ULTRASOFT) lancets Use as instructed 100 each 12   magnesium oxide (MAG-OX) 400 (240 Mg) MG tablet Take 1 tablet (400 mg total) by mouth 2 (two) times daily. Take in the morning and evening. 180 tablet 3   meclizine (ANTIVERT) 25 MG tablet Take 1 tablet (25 mg total) by mouth 3 (three) times daily as needed for dizziness. 270 tablet 0   metFORMIN (GLUCOPHAGE-XR) 500 MG 24 hr tablet Take 3 tablets (1,500 mg total) by mouth daily with breakfast. 270 tablet 1   metoprolol succinate (TOPROL-XL) 100 MG 24 hr tablet TAKE ONE TABLET BY MOUTH EVERY  MORNING and TAKE ONE TABLET BY MOUTH EVERYDAY AT BEDTIME 180 tablet 2   nitroGLYCERIN (NITROSTAT) 0.4 MG SL tablet Place 1 tablet (0.4 mg total) under the tongue every 5 (five) minutes as needed for chest pain (x 3 doses). Reported on 05/08/2015 35 tablet 2   NOVOFINE PEN NEEDLE 32G X 6 MM MISC USE with insulin AS DIRECTED 100 each 3   ondansetron (ZOFRAN) 4 MG tablet Take 1 tablet (4 mg total) by mouth every 6 (six) hours as needed for nausea. 20 tablet 0   oxybutynin (DITROPAN) 5 MG tablet Take 1 tablet (5 mg total) by mouth every 8 (eight) hours as needed for bladder spasms. 30 tablet 0   oxyCODONE-acetaminophen (PERCOCET) 10-325 MG tablet Take 1 tablet by mouth every 4 (four) hours as needed for pain.     pantoprazole (PROTONIX) 40 MG tablet Take 1 tablet (40 mg total) by mouth daily. 30 tablet 0   polyethylene glycol (MIRALAX / GLYCOLAX) 17 g packet Take 17 g by mouth daily. 14 each 0   potassium chloride SA (KLOR-CON M) 20 MEQ tablet TAKE ONE TABLET BY MOUTH EVERY MORNING and TAKE ONE TABLET BY MOUTH EVERYDAY AT BEDTIME 180 tablet 2   sacubitril-valsartan (ENTRESTO) 97-103 MG Take 1 tablet by mouth 2 (two) times daily. 180 tablet 3   Semaglutide, 2 MG/DOSE, (OZEMPIC, 2 MG/DOSE,) 8 MG/3ML SOPN Inject 2 mg into the skin once a week. (Patient taking differently: Inject 2 mg into the skin every Wednesday.) 9 mL 1   senna-docusate (SENOKOT-S) 8.6-50 MG tablet Take 1  tablet by mouth daily. (Patient not taking: Reported on 07/22/2021) 30 tablet 2   spironolactone (ALDACTONE) 25 MG tablet TAKE ONE TABLET BY MOUTH ONCE DAILY (Patient taking differently: Take 25 mg by mouth daily.) 90 tablet 1   thiamine 100 MG tablet Take 1 tablet (100 mg total) by mouth daily. 90 tablet 1   warfarin (COUMADIN) 3 MG tablet TAKE ONE TABLET BY MOUTH DAILY EXCEPT ON MONDAY AND THURSDAY TAKE 1 AND 1/2 TABLETS OR AS DIRECTED by coumadin clinic 100 tablet 2   No current facility-administered medications on file prior to visit.    Allergies  Allergen Reactions   Metformin And Related Other (See Comments)    Must take XR form only, cannot tolerate Regular release metformin   Cleocin [Clindamycin Hcl] Diarrhea   Codeine Itching   Doxycycline Hyclate Diarrhea   Macrolides And Ketolides Diarrhea   Morphine Hives   Pentazocine Lactate Itching and Nausea And Vomiting    (GENERIC- [place])   Vibramycin [Doxycycline Calcium] Diarrhea and Rash   Clindamycin Diarrhea   Definity [Perflutren Lipid Microsphere] Other (See Comments)    Patient complained of warm sensation in chest x few seconds duration when injected Definity.No other symptoms   Doxycycline Diarrhea   Metformin Other (See Comments)   Pentazocine Nausea Only   Perflutren Other (See Comments)   Perflutren Lipid Microspheres Other (See Comments)   Sulfa Antibiotics Diarrhea and Rash   Sulfonamide Derivatives Rash     Assessment/Plan:  1. CHF -  Pt's heart failure symptoms seem well controlled on current regimen.  Blood pressure is 100/68 at goal of <130/80 and pt is currently experiencing no symptoms of hypotension. Weight is near baseline. Pt home HR ranging from 80-90 is above goal, but given the cost of [place], will not start at this time. Continue Entresto 97/103 mg BID, spironolactone 25mg  daily, Jardiance 25mg  daily, metoprolol succinate 100mg  BID, and furosemide 20mg  daily.  Pt voiced concern for wasting the  previous supply of 24/26 and was informed that it would be fine to take 4 of the 24/26 mg tablets twice daily until gone before transitioning to one 97/103 mg tablet twice daily if she prefers not to waste tablets. BMET will be drawn today and will call pt when results are received. Pt was encouraged to reach out with any questions or concerns. F/u with PharmD as needed.  Patient seen with Reatha Harps, PharmD  Megan E. Supple, PharmD, BCACP, [place] [address] [place], [place] [address] Phone: [phone]; Fax: [phone] 08/24/2021 2:03 PM

## 2021-08-24 NOTE — Patient Instructions (Addendum)
Continue current medications as prescribed. Follow up with primary care provider and cardiologist as scheduled.

## 2021-08-25 ENCOUNTER — Ambulatory Visit: Payer: Medicare Other | Admitting: *Deleted

## 2021-08-25 ENCOUNTER — Telehealth: Payer: Self-pay | Admitting: Pharmacist

## 2021-08-25 DIAGNOSIS — I5042 Chronic combined systolic (congestive) and diastolic (congestive) heart failure: Secondary | ICD-10-CM

## 2021-08-25 DIAGNOSIS — Z794 Long term (current) use of insulin: Secondary | ICD-10-CM

## 2021-08-25 LAB — BASIC METABOLIC PANEL
BUN/Creatinine Ratio: 18 (ref 12–28)
BUN: 17 mg/dL (ref 8–27)
CO2: 18 mmol/L — ABNORMAL LOW (ref 20–29)
Calcium: 10.4 mg/dL — ABNORMAL HIGH (ref 8.7–10.3)
Chloride: 106 mmol/L (ref 96–106)
Creatinine, Ser: 0.92 mg/dL (ref 0.57–1.00)
Glucose: 137 mg/dL — ABNORMAL HIGH (ref 70–99)
Potassium: 4.1 mmol/L (ref 3.5–5.2)
Sodium: 141 mmol/L (ref 134–144)
eGFR: 66 mL/min/{1.73_m2} (ref >59)

## 2021-08-25 NOTE — Telephone Encounter (Signed)
Agree. Kristen [name]

## 2021-08-25 NOTE — Telephone Encounter (Signed)
Pt called and informed of lab results. Creatine is at baseline and electrolytes are within normal limits. Pt instructed to continue current medications as outlined in 08/17/21 office visit note. Pt was agreeable and had no questions at this time.

## 2021-08-25 NOTE — Chronic Care Management (AMB) (Signed)
Chronic Care Management   CCM RN Visit Note  08/25/2021 Name: Kristen [name] MRN: [id number] DOB: 1946-02-22  Subjective: Kristen [name] is a 75 y.o. year old female who is a primary care patient of [name], MD. The care management team was consulted for assistance with disease management and care coordination needs.    Engaged with patient by telephone for  acute/ unscheduled outreach/ case closure  in response to provider referral for case management and/or care coordination services.   Consent to Services:  The patient was given information about Chronic Care Management services, agreed to services, and gave verbal consent prior to initiation of services.  Please see initial visit note for detailed documentation.  Patient agreed to services and verbal consent obtained.   Assessment: Review of patient past medical history, allergies, medications, health status, including review of consultants reports, laboratory and other test data, was performed as part of comprehensive evaluation and provision of chronic care management services.  CCM Care Plan  Allergies  Allergen Reactions   Metformin And Related Other (See Comments)    Must take XR form only, cannot tolerate Regular release metformin   Cleocin [Clindamycin Hcl] Diarrhea   Codeine Itching   Doxycycline Hyclate Diarrhea   Macrolides And Ketolides Diarrhea   Morphine Hives   Pentazocine Lactate Itching and Nausea And Vomiting    (GENERIC- [place])   Vibramycin [Doxycycline Calcium] Diarrhea and Rash   Clindamycin Diarrhea   Definity [Perflutren Lipid Microsphere] Other (See Comments)    Patient complained of warm sensation in chest x few seconds duration when injected Definity.No other symptoms   Doxycycline Diarrhea   Metformin Other (See Comments)   Pentazocine Nausea Only   Perflutren Other (See Comments)   Perflutren Lipid Microspheres Other (See Comments)   Sulfa Antibiotics Diarrhea and Rash    Sulfonamide Derivatives Rash   Outpatient Encounter Medications as of 08/25/2021  Medication Sig Note   ALPRAZolam (XANAX) 1 MG tablet TAKE ONE TABLET BY MOUTH EVERY MORNING, TAKE 1/2 TABLET midday, AND TAKE 1/2 TABLET AT BEDTIME    atorvastatin (LIPITOR) 40 MG tablet Take 1 tablet (40 mg total) by mouth daily. (Patient taking differently: Take 40 mg by mouth at bedtime.)    Blood Glucose Monitoring Suppl (ONE TOUCH ULTRA 2) w/Device KIT Inject 1 Act into the skin 3 (three) times daily. Use TID    Continuous Blood Gluc Receiver (FREESTYLE LIBRE 2 READER) DEVI 1 Act by Does not apply route daily.    Continuous Blood Gluc Sensor (FREESTYLE LIBRE 2 SENSOR) MISC 1 Act by Does not apply route daily.    diclofenac Sodium (VOLTAREN) 1 % GEL Apply 2 g topically 4 (four) times daily.    furosemide (LASIX) 20 MG tablet Take 1 tablet (20 mg total) by mouth daily.    gabapentin (NEURONTIN) 300 MG capsule Take ONE capsule by MOUTH three times daily    glucose blood (COOL BLOOD GLUCOSE TEST STRIPS) test strip 1 each by Other route 3 (three) times daily. Use to test blood sugar TID. DX: E11.9    insulin glargine, 2 Unit Dial, (TOUJEO MAX SOLOSTAR) 300 UNIT/ML Solostar Pen Inject 30 Units into the skin daily. (Patient taking differently: Inject 50 Units into the skin at bedtime.)    JARDIANCE 25 MG TABS tablet Take 1 tablet (25 mg total) by mouth daily. Via BI CARES pt assistance    Lancets (ONETOUCH ULTRASOFT) lancets Use as instructed    magnesium oxide (MAG-OX) 400 (240 Mg)  MG tablet Take 1 tablet (400 mg total) by mouth 2 (two) times daily. Take in the morning and evening.    meclizine (ANTIVERT) 25 MG tablet Take 1 tablet (25 mg total) by mouth 3 (three) times daily as needed for dizziness.    metFORMIN (GLUCOPHAGE-XR) 500 MG 24 hr tablet Take 3 tablets (1,500 mg total) by mouth daily with breakfast.    metoprolol succinate (TOPROL-XL) 100 MG 24 hr tablet TAKE ONE TABLET BY MOUTH EVERY MORNING and TAKE ONE  TABLET BY MOUTH EVERYDAY AT BEDTIME    nitroGLYCERIN (NITROSTAT) 0.4 MG SL tablet Place 1 tablet (0.4 mg total) under the tongue every 5 (five) minutes as needed for chest pain (x 3 doses). Reported on 05/08/2015    NOVOFINE PEN NEEDLE 32G X 6 MM MISC USE with insulin AS DIRECTED    ondansetron (ZOFRAN) 4 MG tablet Take 1 tablet (4 mg total) by mouth every 6 (six) hours as needed for nausea.    oxybutynin (DITROPAN) 5 MG tablet Take 1 tablet (5 mg total) by mouth every 8 (eight) hours as needed for bladder spasms.    oxyCODONE-acetaminophen (PERCOCET) 10-325 MG tablet Take 1 tablet by mouth every 4 (four) hours as needed for pain.    pantoprazole (PROTONIX) 40 MG tablet Take 1 tablet (40 mg total) by mouth daily.    polyethylene glycol (MIRALAX / GLYCOLAX) 17 g packet Take 17 g by mouth daily.    potassium chloride SA (KLOR-CON M) 20 MEQ tablet TAKE ONE TABLET BY MOUTH EVERY MORNING and TAKE ONE TABLET BY MOUTH EVERYDAY AT BEDTIME    sacubitril-valsartan (ENTRESTO) 97-103 MG Take 1 tablet by mouth 2 (two) times daily.    Semaglutide, 2 MG/DOSE, (OZEMPIC, 2 MG/DOSE,) 8 MG/3ML SOPN Inject 2 mg into the skin once a week. (Patient taking differently: Inject 2 mg into the skin every Wednesday.)    senna-docusate (SENOKOT-S) 8.6-50 MG tablet Take 1 tablet by mouth daily. (Patient not taking: Reported on 07/22/2021) 07/22/2021: Pt didn't know she was supposed to be on this   spironolactone (ALDACTONE) 25 MG tablet TAKE ONE TABLET BY MOUTH ONCE DAILY (Patient taking differently: Take 25 mg by mouth daily.)    thiamine 100 MG tablet Take 1 tablet (100 mg total) by mouth daily.    warfarin (COUMADIN) 3 MG tablet TAKE ONE TABLET BY MOUTH DAILY EXCEPT ON MONDAY AND THURSDAY TAKE 1 AND 1/2 TABLETS OR AS DIRECTED by coumadin clinic    No facility-administered encounter medications on file as of 08/25/2021.   Patient Active Problem List   Diagnosis Date Noted   Right foot pain 07/26/2021   Abnormal LFTs 07/25/2021    Hypoalbuminemia 07/25/2021   Hypokalemia 07/23/2021   Hyperbilirubinemia 07/23/2021   Burning with urination 07/23/2021   Vertigo 07/23/2021   Stercoral colitis 07/22/2021   Fecal impaction in rectum ([place]) 07/22/2021   Weakness of extremity 07/07/2021   Delusions of parasitosis ([place]) 07/07/2021   Severe episode of recurrent major depressive disorder, with psychotic features ([place]) 06/15/2021   GAD (generalized anxiety disorder) 06/02/2021   Stage 3a chronic kidney disease ([place]) 03/17/2021   PAF (paroxysmal atrial fibrillation) ([place])    Thiamine deficiency 12/30/2018   Vitamin B12 deficiency anemia due to intrinsic factor deficiency 12/20/2018   Chronic combined systolic and diastolic CHF (congestive heart failure) ([place]) 12/19/2018   Coronary artery disease involving native coronary artery of native heart with angina pectoris ([place]) 12/19/2018   Obesity with body mass index (BMI) of 30.0 to  39.9 01/15/2018   Chronic idiopathic constipation 12/27/2017   Type 2 diabetes mellitus without complication, with long-term current use of insulin ([place]) 10/30/2017   Eczema 08/07/2017   Insomnia secondary to chronic pain 08/04/2016   Visit for screening mammogram 04/14/2016   LV dysfunction    Routine general medical examination at a health care facility 05/03/2013   DM (diabetes mellitus), type 2 with peripheral vascular complications ([place]) 00/93/8182   PVC's/nonsustained VT 10/24/2012   H/O: CVA (cerebrovascular accident) 02/09/2012   Hypothyroidism 05/27/2011   Fibromyalgia 10/08/2010   Long term current use of anticoagulant 05/05/2010   Low back pain, non-specific 05/12/2008   Cognitive dysfunction associated with depression 01/29/2007   Osteoarthritis 11/22/2006   Mixed diabetic hyperlipidemia associated with type 2 diabetes mellitus ([place]) 09/13/2006   Coronary atherosclerosis 09/13/2006   Paroxysmal atrial fibrillation ([place]) 09/13/2006   GERD without esophagitis 09/13/2006    Hypertension associated with diabetes ([place]) 08/30/2006   Conditions to be addressed/monitored:  CHF and DMII  Care Plan : RN Care Manager Plan of Care  Updates made by Knox Royalty, RN since 08/25/2021 12:00 AM     Problem: Chronic Disease Management Needs   Priority: High     Long-Range Goal: Ongoing adherence to established plan of care for long term chronic disease management   Start Date: 01/07/2021  Expected End Date: 01/07/2022  Recent Progress: On track  Priority: High  Note:   Current Barriers:  Chronic Disease Management support and education needs related to CHF and DMII Unable to independently verbalize appropriate choices for DM soft diet- requires ongoing reinforcement / support Does not consistently adhere to provider recommendations re: following low carb/ sugar diet Ongoing dental issues- unable to afford dental care: community resource care guide referral placed for same 06/01/21: second referral placed with [facility] Team- patient has recently moved and has misplaced previously provided list of resources 07/13/21: verified by review of EHR that patient was contacted by [facility] and was provided dental resources as requested Chronic pain: followed regularly by pain management provider Recent hospital admission May 17-23, 2023 for fecal impaction: discharged from hospital to self care with home health services through [facility] 08/05/21: Patient reports she cancelled home health visits due to ongoing episodes of loose stools at home; states she does not believe she needs home health services; states husband taking care of her post-hospital discharge Mental health history: previously active with clinical psychiatry provider 07/13/21- patient tells me she is no longer active with psychiatric provider; has been working with CCM CSW on counseling services- tells me she has the form for [facility] counseling services and will be placing in mail  tomorrow: encouraged her ongoing engagement with CCM CSW for mental health needs 08/25/21- confirmed patient remains active with psychiatric counseling provider: [place]):  Patient will demonstrate Ongoing health management independence CHF; DMII  through collaboration with RN Care manager, provider, and care team.   Interventions: 1:1 collaboration with primary care provider regarding development and update of comprehensive plan of care as evidenced by provider attestation and co-signature Inter-disciplinary care team collaboration (see longitudinal plan of care) Evaluation of current treatment plan related to  self management and patient's adherence to plan as established by provider Review of patient status, including review of consultants reports, relevant laboratory and other test results, and medications completed 08/25/21: acute/ unscheduled outreach; patient left voice message yesterday afternoon, requesting call-back; returned patient's call as requested Patient reports she  needs the phone number to her counseling group: this was provided to patient She is requesting resources for "affordable senior apartments;" and tells me that she is considering moving out of her current apartment, into apartment by herself; states that she and her husband have been having marital difficulty and she would like to know what her options are for moving into an apartment by herself; she is not sure she is going to actually move, but tells me she would like this information in case she decides to in the future: [place] referral placed Encouraged patient to follow up regularly with her psychiatric providers/ counselors about her marital issues: she confirms she is established with them and has regular follow up Confirmed no clinical changes or concerns since our last outreach on 08/05/21: acknowledged with patient that she has made good progress in meeting her  previously established care management goals; she is agreeable to case closure today and verbalizes understanding to contact her care providers as/ if needed in the future; case closure accordingly  Diabetes Interventions: GOAL status: 08/25/21: Goal met; Long Term goal  Counseled on importance of regular laboratory monitoring as prescribed Confirmed patient continues monitoring and recording blood sugars at home post-hospital discharge- encouraged her ongoing home monitoring Reinforced previously provided education around DMII-appropriate diet; continues to report "I do best I can" to follow diabetic/ heart healthy diet;  positive reinforcement provided with encouragement to continue efforts  Lab Results  Component Value Date   HGBA1C 7.8 (H) 07/23/2021  Heart Failure Interventions:  GOAL status: 08/25/21: Goal met; Long Term Goal Discussed importance of daily weight and advised patient to weigh and record daily Discussed the importance of keeping all appointments with provider Reviewed with patient recent weights at home: she reports weight ranges consistently between 172-173 lbs at home: positive reinforcement provided for her ongoing adherence to daily weight monitoring at home and reinforced rationale for same Reinforced previously provided education around signs/ symptoms yellow CHF zone along with corresponding action plan; she denies signs/ symptoms today, states "breathing is okay;" denies lower extremity swelling outside of normal/ baseline; encouraged her ongoing daily self assessments and encouraged to call care providers promptly for any new concerns     Plan: No further follow up required: patient has met her previously established care management goals; CCM RN CM case closure today  [name], RN, BSN, [place] [phone]: direct office

## 2021-08-26 ENCOUNTER — Telehealth: Payer: Self-pay

## 2021-08-26 NOTE — Telephone Encounter (Signed)
   Telephone encounter was:  Successful.  08/26/2021 Name: Kristen [name] MRN: [id number] DOB: 1945-10-05  Kristen [name] is a 76 y.o. year old female who is a primary care patient of [name], MD . The community resource team was consulted for assistance with  senior living  Care guide performed the following interventions: Patient provided with information about care guide support team and interviewed to confirm resource needs.Patient fell as I was on the phone with the patient and her husband asked if i could call back. I will follow up later today or tomorrow  Follow Up Plan:  Care guide will follow up with patient by phone over the next day    [place], Care Management  [phone] [address], [place], [place] [address] Phone: [phone] Email: [email or web address]

## 2021-08-27 ENCOUNTER — Telehealth: Payer: Self-pay

## 2021-08-27 ENCOUNTER — Ambulatory Visit (INDEPENDENT_AMBULATORY_CARE_PROVIDER_SITE_OTHER): Payer: Medicare Other

## 2021-08-27 DIAGNOSIS — Z7901 Long term (current) use of anticoagulants: Secondary | ICD-10-CM

## 2021-08-27 LAB — POCT INR: INR: 1.7 — AB (ref 2.0–3.0)

## 2021-08-30 ENCOUNTER — Ambulatory Visit: Payer: Self-pay | Admitting: Licensed Clinical Social Worker

## 2021-08-30 ENCOUNTER — Telehealth: Payer: Self-pay

## 2021-08-30 DIAGNOSIS — E1159 Type 2 diabetes mellitus with other circulatory complications: Secondary | ICD-10-CM

## 2021-08-30 DIAGNOSIS — Z7689 Persons encountering health services in other specified circumstances: Secondary | ICD-10-CM

## 2021-08-30 DIAGNOSIS — E119 Type 2 diabetes mellitus without complications: Secondary | ICD-10-CM
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
## 2021-08-30 NOTE — Telephone Encounter (Signed)
  Mailing Resources    [facility] Guide, Embedded Care Coordination [facility], Care Management  [phone] [address] [place], [place], [place] [address] Phone: [phone] Email: [email or web address]

## 2021-09-01 ENCOUNTER — Ambulatory Visit (INDEPENDENT_AMBULATORY_CARE_PROVIDER_SITE_OTHER): Payer: Medicare Other | Admitting: Internal Medicine

## 2021-09-01 ENCOUNTER — Encounter: Payer: Self-pay | Admitting: Internal Medicine

## 2021-09-01 VITALS — BP 116/72 | HR 84 | Temp 98.1°F | Ht 61.0 in | Wt 173.0 lb

## 2021-09-01 DIAGNOSIS — I5042 Chronic combined systolic (congestive) and diastolic (congestive) heart failure: Secondary | ICD-10-CM

## 2021-09-01 DIAGNOSIS — E039 Hypothyroidism, unspecified: Secondary | ICD-10-CM | POA: Diagnosis not present

## 2021-09-01 DIAGNOSIS — E1151 Type 2 diabetes mellitus with diabetic peripheral angiopathy without gangrene: Secondary | ICD-10-CM

## 2021-09-01 DIAGNOSIS — F333 Major depressive disorder, recurrent, severe with psychotic symptoms: Secondary | ICD-10-CM | POA: Diagnosis not present

## 2021-09-01 DIAGNOSIS — F22 Delusional disorders: Secondary | ICD-10-CM

## 2021-09-01 MED ORDER — ARIPIPRAZOLE 2 MG PO TABS: 2.0000 mg | ORAL_TABLET | Freq: Every day | ORAL | 0 refills | Status: DC

## 2021-09-01 NOTE — Patient Instructions (Signed)
Major Depressive Disorder, Adult Major depressive disorder (MDD) is a mental health condition. It may also be called clinical depression or unipolar depression. MDD causes symptoms of sadness, hopelessness, and loss of interest in things. These symptoms last most of the day, almost every day, for 2 weeks. MDD can also cause physical symptoms. It can interfere with relationships and with everyday activities, such as work, school, and activities that are usually pleasant. MDD may be mild, moderate, or severe. It may be single-episode MDD, which happens once, or recurrent MDD, which may occur multiple times. What are the causes? The exact cause of this condition is not known. MDD is most likely caused by a combination of things, which may include: Your personality traits. Learned or conditioned behaviors or thoughts or feelings that reinforce negativity. Any alcohol or substance misuse. Long-term (chronic) physical or mental health illness. Going through a traumatic experience or major life changes. What increases the risk? The following factors may make someone more likely to develop MDD: A family history of depression. Being a woman. Troubled family relationships. Abnormally low levels of certain brain chemicals. Traumatic or painful events in childhood, especially abuse or loss of a parent. A lot of stress from life experiences, such as poor living conditions or discrimination. Chronic physical illness or other mental health disorders. What are the signs or symptoms? The main symptoms of MDD usually include: Constant depressed or irritable mood. A loss of interest in things and activities. Other symptoms include: Sleeping or eating too much or too little. Unexplained weight gain or weight loss. Tiredness or low energy. Being agitated, restless, or weak. Feeling hopeless, worthless, or guilty. Trouble thinking clearly or making decisions. Thoughts of suicide or thoughts of harming  others. Isolating oneself or avoiding other people or activities. Trouble completing tasks, work, or any normal obligations. Severe symptoms of this condition may include: Psychotic depression.This may include false beliefs, or delusions. It may also include seeing, hearing, tasting, smelling, or feeling things that are not real (hallucinations). Chronic depression or persistent depressive disorder. This is low-level depression that lasts for at least 2 years. Melancholic depression, or feeling extremely sad and hopeless. Catatonic depression, which includes trouble speaking and trouble moving. How is this diagnosed? This condition may be diagnosed based on: Your symptoms. Your medical and mental health history. You may be asked questions about your lifestyle, including any drug and alcohol use. A physical exam. Blood tests to rule out other conditions. MDD is confirmed if you have the following symptoms most of the day, nearly every day, in a 2-week period: Either a depressed mood or loss of interest. At least four other MDD symptoms. How is this treated? This condition is usually treated by mental health professionals, such as psychologists, psychiatrists, and clinical social workers. You may need more than one type of treatment. Treatment may include: Psychotherapy, also called talk therapy or counseling. Types of psychotherapy include: Cognitive behavioral therapy (CBT). This teaches you to recognize unhealthy feelings, thoughts, and behaviors, and replace them with positive thoughts and actions. Interpersonal therapy (IPT). This helps you to improve the way you communicate with others or relate to them. Family therapy. This treatment includes members of your family. Medicines to treat anxiety and depression. These medicines help to balance the brain chemicals that affect your emotions. Lifestyle changes. You may be asked to: Limit alcohol use and avoid drug use. Get regular  exercise. Get plenty of sleep. Make healthy eating choices. Spend more time outdoors. Brain stimulation. This may   be done if symptoms are very severe and other treatments have not worked. Examples of this treatment are electroconvulsive therapy and transcranial magnetic stimulation. Follow these instructions at home: Activity Exercise regularly and spend time outdoors. Find activities that you enjoy doing, and make time to do them. Find healthy ways to manage stress, such as: Meditation or deep breathing. Spending time in nature. Journaling. Return to your normal activities as told by your health care provider. Ask your health care provider what activities are safe for you. Alcohol and drug use If you drink alcohol: Limit how much you use to: 0-1 drink a day for women who are not pregnant. 0-2 drinks a day for men. Be aware of how much alcohol is in your drink. In the [place]., one drink equals one 12 oz bottle of beer (355 mL), one 5 oz glass of wine (148 mL), or one 1 oz glass of hard liquor (44 mL). Discuss your alcohol use with your health care provider. Alcohol can affect any antidepressant medicines you are taking. Discuss any drug use with your health care provider. General instructions  Take over-the-counter and prescription medicines only as told by your health care provider. Eat a healthy diet and get plenty of sleep. Consider joining a support group. Your health care provider may be able to recommend one. Keep all follow-up visits as told by your health care provider. This is important. Where to find more information National Alliance on Mental Illness: www.nami.org U.[place]. National Institute of Mental Health: www.nimh.nih.gov Contact a health care provider if: Your symptoms get worse. You develop new symptoms. Get help right away if: You self-harm. You have serious thoughts about hurting yourself or others. You hallucinate. If you ever feel like you may hurt yourself or  others, or have thoughts about taking your own life, get help right away. Go to your nearest emergency department or: Call your local emergency services (911 in the [place] Call a suicide crisis helpline, such as the National Suicide Prevention Lifeline at 1-800-273-8255 or 988 in the [place] This is open 24 hours a day in the [place] Text the Crisis Text Line at 741741 (in the [place].). Summary Major depressive disorder (MDD) is a mental health condition. MDD causes symptoms of sadness, hopelessness, and loss of interest in things. These symptoms last most of the day, almost every day, for 2 weeks. The symptoms of MDD can interfere with relationships and with everyday activities. Treatments and support are available for people who develop MDD. You may need more than one type of treatment. Get help right away if you have serious thoughts about hurting yourself or others. This information is not intended to replace advice given to you by your health care provider. Make sure you discuss any questions you have with your health care provider. Document Revised: 09/16/2020 Document Reviewed: 02/02/2019 Elsevier Patient Education  2023 [employer]vier Inc.  

## 2021-09-01 NOTE — Progress Notes (Signed)
Subjective:  Patient ID: Kristen [name], female    DOB: 03/05/1946  Age: 76 y.o. MRN: [id number]  CC: Depression, Diabetes, Hypothyroidism, and Congestive Heart Failure   HPI Kristen [name] presents for f/up - She continues to have delusions of parasitosis.  She points to an area on her right lower anterior leg where there is no evidence of parasite infestation or rash.  She wants a new mattress and a new home.  She is not taking vilazodone because it caused side effects.  Outpatient Medications Prior to Visit  Medication Sig Dispense Refill   ALPRAZolam (XANAX) 1 MG tablet TAKE ONE TABLET BY MOUTH EVERY MORNING, TAKE 1/2 TABLET midday, AND TAKE 1/2 TABLET AT BEDTIME 180 tablet 0   atorvastatin (LIPITOR) 40 MG tablet Take 1 tablet (40 mg total) by mouth daily. (Patient taking differently: Take 40 mg by mouth at bedtime.) 90 tablet 1   Blood Glucose Monitoring Suppl (ONE TOUCH ULTRA 2) w/Device KIT Inject 1 Act into the skin 3 (three) times daily. Use TID 1 kit 2   Continuous Blood Gluc Receiver (FREESTYLE LIBRE 2 READER) DEVI 1 Act by Does not apply route daily. 2 each 5   Continuous Blood Gluc Sensor (FREESTYLE LIBRE 2 SENSOR) MISC 1 Act by Does not apply route daily. 2 each 5   diclofenac Sodium (VOLTAREN) 1 % GEL Apply 2 g topically 4 (four) times daily. 100 g 0   furosemide (LASIX) 20 MG tablet Take 1 tablet (20 mg total) by mouth daily. 60 tablet 3   gabapentin (NEURONTIN) 300 MG capsule Take ONE capsule by MOUTH three times daily 270 capsule 1   glucose blood (COOL BLOOD GLUCOSE TEST STRIPS) test strip 1 each by Other route 3 (three) times daily. Use to test blood sugar TID. DX: E11.9 300 each 1   insulin glargine, 2 Unit Dial, (TOUJEO MAX SOLOSTAR) 300 UNIT/ML Solostar Pen Inject 30 Units into the skin daily. (Patient taking differently: Inject 50 Units into the skin at bedtime.) 9 mL 1   Lancets (ONETOUCH ULTRASOFT) lancets Use as instructed 100 each 12   magnesium oxide  (MAG-OX) 400 (240 Mg) MG tablet Take 1 tablet (400 mg total) by mouth 2 (two) times daily. Take in the morning and evening. 180 tablet 3   meclizine (ANTIVERT) 25 MG tablet Take 1 tablet (25 mg total) by mouth 3 (three) times daily as needed for dizziness. 270 tablet 0   metoprolol succinate (TOPROL-XL) 100 MG 24 hr tablet TAKE ONE TABLET BY MOUTH EVERY MORNING and TAKE ONE TABLET BY MOUTH EVERYDAY AT BEDTIME 180 tablet 2   nitroGLYCERIN (NITROSTAT) 0.4 MG SL tablet Place 1 tablet (0.4 mg total) under the tongue every 5 (five) minutes as needed for chest pain (x 3 doses). Reported on 05/08/2015 35 tablet 2   NOVOFINE PEN NEEDLE 32G X 6 MM MISC USE with insulin AS DIRECTED 100 each 3   ondansetron (ZOFRAN) 4 MG tablet Take 1 tablet (4 mg total) by mouth every 6 (six) hours as needed for nausea. 20 tablet 0   oxybutynin (DITROPAN) 5 MG tablet Take 1 tablet (5 mg total) by mouth every 8 (eight) hours as needed for bladder spasms. 30 tablet 0   oxyCODONE-acetaminophen (PERCOCET) 10-325 MG tablet Take 1 tablet by mouth every 4 (four) hours as needed for pain.     pantoprazole (PROTONIX) 40 MG tablet Take 1 tablet (40 mg total) by mouth daily. 30 tablet 0   polyethylene glycol (MIRALAX /  GLYCOLAX) 17 g packet Take 17 g by mouth daily. 14 each 0   potassium chloride SA (KLOR-CON M) 20 MEQ tablet TAKE ONE TABLET BY MOUTH EVERY MORNING and TAKE ONE TABLET BY MOUTH EVERYDAY AT BEDTIME 180 tablet 2   sacubitril-valsartan (ENTRESTO) 97-103 MG Take 1 tablet by mouth 2 (two) times daily. 180 tablet 3   Semaglutide, 2 MG/DOSE, (OZEMPIC, 2 MG/DOSE,) 8 MG/3ML SOPN Inject 2 mg into the skin once a week. (Patient taking differently: Inject 2 mg into the skin every Wednesday.) 9 mL 1   senna-docusate (SENOKOT-S) 8.6-50 MG tablet Take 1 tablet by mouth daily. 30 tablet 2   spironolactone (ALDACTONE) 25 MG tablet TAKE ONE TABLET BY MOUTH ONCE DAILY (Patient taking differently: Take 25 mg by mouth daily.) 90 tablet 1    thiamine 100 MG tablet Take 1 tablet (100 mg total) by mouth daily. 90 tablet 1   warfarin (COUMADIN) 3 MG tablet TAKE ONE TABLET BY MOUTH DAILY EXCEPT ON MONDAY AND THURSDAY TAKE 1 AND 1/2 TABLETS OR AS DIRECTED by coumadin clinic 100 tablet 2   JARDIANCE 25 MG TABS tablet Take 1 tablet (25 mg total) by mouth daily. Via BI CARES pt assistance 90 tablet 1   metFORMIN (GLUCOPHAGE-XR) 500 MG 24 hr tablet Take 3 tablets (1,500 mg total) by mouth daily with breakfast. 270 tablet 1   No facility-administered medications prior to visit.    ROS Review of Systems  Constitutional: Negative.  Negative for chills, fatigue and fever.  HENT: Negative.    Eyes: Negative.   Respiratory:  Negative for cough, chest tightness and wheezing.   Cardiovascular:  Negative for chest pain, palpitations and leg swelling.  Gastrointestinal:  Negative for abdominal pain and diarrhea.  Endocrine: Negative.   Genitourinary: Negative.  Negative for difficulty urinating.  Musculoskeletal: Negative.   Skin: Negative.  Negative for color change and rash.  Neurological:  Negative for dizziness, weakness and light-headedness.  Hematological:  Negative for adenopathy. Does not bruise/bleed easily.  Psychiatric/Behavioral:  Positive for decreased concentration, dysphoric mood and sleep disturbance. Negative for confusion and suicidal ideas. The patient is not nervous/anxious.     Objective:  BP 116/72 (BP Location: Right Arm, Patient Position: Sitting, Cuff Size: Large)   Pulse 84   Temp 98.1 F (36.7 C) (Oral)   Ht $R'5\' 1"'fI$  (1.549 m)   Wt 173 lb (78.5 kg)   SpO2 98%   BMI 32.69 kg/m   BP Readings from Last 3 Encounters:  09/01/21 116/72  08/24/21 100/68  08/12/21 122/76    Wt Readings from Last 3 Encounters:  09/01/21 173 lb (78.5 kg)  08/24/21 170 lb (77.1 kg)  08/12/21 174 lb 3.2 oz (79 kg)    Physical Exam Vitals reviewed.  HENT:     Nose: Nose normal.     Mouth/Throat:     Mouth: Mucous membranes  are moist.  Eyes:     General: No scleral icterus.    Conjunctiva/sclera: Conjunctivae normal.  Cardiovascular:     Rate and Rhythm: Normal rate and regular rhythm.     Heart sounds: No murmur heard. Pulmonary:     Effort: Pulmonary effort is normal.     Breath sounds: No stridor. No wheezing, rhonchi or rales.  Abdominal:     General: Abdomen is flat.     Palpations: There is no mass.     Tenderness: There is no abdominal tenderness. There is no guarding.     Hernia: No hernia is  present.  Musculoskeletal:     Cervical back: Neck supple.  Lymphadenopathy:     Cervical: No cervical adenopathy.  Skin:    General: Skin is warm and dry.  Neurological:     General: No focal deficit present.     Mental Status: She is alert. Mental status is at baseline.  Psychiatric:        Attention and Perception: Perception normal. She is inattentive.        Mood and Affect: Mood is depressed. Mood is not anxious or elated. Affect is flat.        Speech: Speech is delayed. Speech is not tangential.        Behavior: Behavior is slowed and withdrawn. Behavior is cooperative.        Thought Content: Thought content normal. Thought content is not paranoid or delusional. Thought content does not include homicidal or suicidal ideation.     Lab Results  Component Value Date   WBC 5.0 07/27/2021   HGB 12.0 07/27/2021   HCT 39.1 07/27/2021   PLT 349 07/27/2021   GLUCOSE 137 (H) 08/24/2021   CHOL 98 09/21/2020   TRIG 126.0 09/21/2020   HDL 34.60 (L) 09/21/2020   LDLDIRECT 116.0 11/28/2008   LDLCALC 39 09/21/2020   ALT 38 07/27/2021   AST 24 07/27/2021   NA 141 08/24/2021   K 4.1 08/24/2021   CL 106 08/24/2021   CREATININE 0.92 08/24/2021   BUN 17 08/24/2021   CO2 18 (L) 08/24/2021   TSH 3.99 03/17/2021   INR 1.7 (A) 08/27/2021   HGBA1C 7.8 (H) 07/23/2021   MICROALBUR <0.7 09/21/2020    MR Brain Wo Contrast  Result Date: 08/16/2021 CLINICAL DATA:  Provided history: Weakness of  extremity. Delusions of parasitosis. Ataxia, nontraumatic, stroke excluded. Additional history provided by scanning technologist: Vertigo, difficulty walking and speaking for 1 month, bilateral leg weakness. EXAM: MRI HEAD WITHOUT CONTRAST TECHNIQUE: Multiplanar, multiecho pulse sequences of the brain and surrounding structures were obtained without intravenous contrast. COMPARISON:  Brain MRI 04/24/2017 (images available, report unavailable). FINDINGS: Brain: Mild generalized cerebral atrophy. Redemonstrated small chronic cortically based infarct with associated chronic blood products in the left superior temporal gyrus. Multifocal T2 FLAIR hyperintense signal abnormality within the cerebral white matter, nonspecific but compatible with mild chronic small vessel ischemic disease. Partially empty sella turcica. There is no acute infarct. No evidence of an intracranial mass. No extra-axial fluid collection. No midline shift. Vascular: Maintained flow voids within the proximal large arterial vessels. Skull and upper cervical spine: No focal suspicious marrow lesion. Sinuses/Orbits: No mass or acute finding within the imaged orbits. No significant paranasal sinus disease. IMPRESSION: No evidence of acute intracranial abnormality. Redemonstrated small chronic cortically-based infarct with associated chronic blood products in the left superior temporal gyrus. Mild chronic small vessel ischemic changes within the cerebral white matter, progressed from the prior brain MRI of 04/24/2017. Mild generalized cerebral atrophy. Electronically Signed   By: Jackey Loge D.O.   On: 08/16/2021 07:45    Assessment & Plan:   Adryanna was seen today for depression, diabetes, hypothyroidism and congestive heart failure.  Diagnoses and all orders for this visit:  Severe episode of recurrent major depressive disorder, with psychotic features (HCC) -     ARIPiprazole (ABILIFY) 2 MG tablet; Take 1 tablet (2 mg total) by mouth  daily.  Delusions of parasitosis (HCC) -     ARIPiprazole (ABILIFY) 2 MG tablet; Take 1 tablet (2 mg total) by mouth daily.  Acquired hypothyroidism- I will monitor her TSH level. -     TSH; Future  DM (diabetes mellitus), type 2 with peripheral vascular complications ([place])- Her recent A1c shows adequate control of the blood sugar. -     JARDIANCE 25 MG TABS tablet; Take 1 tablet (25 mg total) by mouth daily. Via BI CARES pt assistance -     metFORMIN (GLUCOPHAGE-XR) 500 MG 24 hr tablet; Take 3 tablets (1,500 mg total) by mouth daily with breakfast.  CHF (congestive heart failure), NYHA class I, chronic, combined ([place])- She has a normal volume status. -     JARDIANCE 25 MG TABS tablet; Take 1 tablet (25 mg total) by mouth daily. Via BI CARES pt assistance   I am having Kristen [name] "Fraser Din" start on ARIPiprazole. I am also having her maintain her onetouch ultrasoft, Novofine Pen Needle, Ozempic (2 MG/DOSE), magnesium oxide, spironolactone, FreeStyle Libre 2 Sensor, [employer] 2 Reader, ONE TOUCH ULTRA 2, Cool Blood Glucose Test Strips, meclizine, atorvastatin, Toujeo Max SoloStar, thiamine, senna-docusate, oxyCODONE-acetaminophen, polyethylene glycol, pantoprazole, oxybutynin, ondansetron, diclofenac Sodium, furosemide, gabapentin, potassium chloride SA, Entresto, nitroGLYCERIN, metoprolol succinate, warfarin, ALPRAZolam, Jardiance, and metFORMIN.  Meds ordered this encounter  Medications   ARIPiprazole (ABILIFY) 2 MG tablet    Sig: Take 1 tablet (2 mg total) by mouth daily.    Dispense:  30 tablet    Refill:  0   JARDIANCE 25 MG TABS tablet    Sig: Take 1 tablet (25 mg total) by mouth daily. Via BI CARES pt assistance    Dispense:  90 tablet    Refill:  1   metFORMIN (GLUCOPHAGE-XR) 500 MG 24 hr tablet    Sig: Take 3 tablets (1,500 mg total) by mouth daily with breakfast.    Dispense:  270 tablet    Refill:  1    This prescription was filled on 09/11/2020. Any refills  authorized will be placed on file.     Follow-up: Return in about 3 months (around 12/02/2021).  Scarlette Calico, MD

## 2021-09-03 DIAGNOSIS — Z794 Long term (current) use of insulin: Secondary | ICD-10-CM

## 2021-09-03 DIAGNOSIS — E1159 Type 2 diabetes mellitus with other circulatory complications: Secondary | ICD-10-CM | POA: Diagnosis not present

## 2021-09-03 DIAGNOSIS — I509 Heart failure, unspecified: Secondary | ICD-10-CM

## 2021-09-03 DIAGNOSIS — Z7984 Long term (current) use of oral hypoglycemic drugs: Secondary | ICD-10-CM

## 2021-09-04 ENCOUNTER — Encounter: Payer: Self-pay | Admitting: Internal Medicine

## 2021-09-04 MED ORDER — METFORMIN HCL ER 500 MG PO TB24: 1500.0000 mg | ORAL_TABLET | Freq: Every day | ORAL | 1 refills | Status: DC

## 2021-09-04 MED ORDER — JARDIANCE 25 MG PO TABS: 25.0000 mg | ORAL_TABLET | Freq: Every day | ORAL | 1 refills | Status: DC

## 2021-09-08 ENCOUNTER — Other Ambulatory Visit (INDEPENDENT_AMBULATORY_CARE_PROVIDER_SITE_OTHER): Payer: Medicare Other

## 2021-09-08 DIAGNOSIS — E039 Hypothyroidism, unspecified: Secondary | ICD-10-CM | POA: Diagnosis not present

## 2021-09-08 LAB — TSH: TSH: 2.67 u[IU]/mL (ref 0.35–5.50)

## 2021-09-10 ENCOUNTER — Telehealth: Payer: Self-pay

## 2021-09-10 ENCOUNTER — Ambulatory Visit (INDEPENDENT_AMBULATORY_CARE_PROVIDER_SITE_OTHER): Payer: Medicare Other

## 2021-09-10 ENCOUNTER — Other Ambulatory Visit: Payer: Self-pay | Admitting: Internal Medicine

## 2021-09-10 DIAGNOSIS — Z7901 Long term (current) use of anticoagulants: Secondary | ICD-10-CM | POA: Diagnosis not present

## 2021-09-10 DIAGNOSIS — F333 Major depressive disorder, recurrent, severe with psychotic symptoms: Secondary | ICD-10-CM

## 2021-09-10 LAB — POCT INR: INR: 2.8 (ref 2.0–3.0)

## 2021-09-10 NOTE — Patient Instructions (Addendum)
Pre visit review using our clinic review tool, if applicable. No additional management support is needed unless otherwise documented below in the visit note.  Continue 1 1/2 tablet daily except take 1 tablet on Sundays, Tuesdays and Thursdays. Recheck in 4 week.

## 2021-09-10 NOTE — Telephone Encounter (Signed)
   Telephone encounter was:  Successful.  09/10/2021 Name: Kristen [name] MRN: [id number] DOB: Oct 14, 1945  Kristen [name] is a 76 y.o. year old female who is a primary care patient of [name], MD . The community resource team was consulted for assistance with  housing  Care guide performed the following interventions: Patient provided with information about care guide support team and interviewed to confirm resource needs.Patient called and we talked about the resources I mailed her fo Housing. Patient will reach back out if needed  Follow Up Plan:  No further follow up planned at this time. The patient has been provided with needed resources.    [facility], Care Management  [phone] [address], [place], [place] [address] Phone: [phone] Email: [email or web address]

## 2021-09-10 NOTE — Progress Notes (Signed)
Continue 1 1/2 tablet daily except take 1 tablet on Sundays, Tuesdays and Thursdays. Recheck in 4 week.

## 2021-09-10 NOTE — Telephone Encounter (Signed)
Pt in coumadin clinic today and reports she sent a msg regarding paperwork for Ozempic pt assistance but has not heard back from the office. Printed pt assistance paperwork from online from [employer]. Provided pt with the first 3 pages of the application which she will complete and return with verification of income.   Placing last 2 pages of application on PCP's CMA, Shir[place], desk. These pages need to be completed by PCP.

## 2021-09-13 DIAGNOSIS — Z79891 Long term (current) use of opiate analgesic: Secondary | ICD-10-CM | POA: Diagnosis not present

## 2021-09-13 DIAGNOSIS — G894 Chronic pain syndrome: Secondary | ICD-10-CM | POA: Diagnosis not present

## 2021-09-13 DIAGNOSIS — M47816 Spondylosis without myelopathy or radiculopathy, lumbar region: Secondary | ICD-10-CM | POA: Diagnosis not present

## 2021-09-13 DIAGNOSIS — M150 Primary generalized (osteo)arthritis: Secondary | ICD-10-CM | POA: Diagnosis not present

## 2021-09-13 NOTE — Telephone Encounter (Signed)
Forms has been completed and signed by PCP.   Will fax after receiving pt portion/income verification.

## 2021-09-14 NOTE — Telephone Encounter (Signed)
Pt has brought back patient portion. Forms have been faxed to [employer].

## 2021-09-15 ENCOUNTER — Other Ambulatory Visit: Payer: Self-pay | Admitting: Internal Medicine

## 2021-09-15 DIAGNOSIS — F333 Major depressive disorder, recurrent, severe with psychotic symptoms: Secondary | ICD-10-CM

## 2021-09-21 ENCOUNTER — Encounter: Payer: Self-pay | Admitting: Internal Medicine

## 2021-09-23 ENCOUNTER — Telehealth: Payer: Self-pay | Admitting: Internal Medicine

## 2021-09-23 NOTE — Telephone Encounter (Signed)
Pt called requesting a callback from [employer]. She stated her form for patient assistance is confusing. And she also stated she has not heard anything from [employer].   Please advise.  CB: [phone]

## 2021-09-24 NOTE — Telephone Encounter (Signed)
I have reiterated to the pt hat no signatures were missing. I stated that I would follow up with [employer] for an update.   I have spoken with a representative with [employer]. Pt put incorrect date on a line that needed to be verified before application could be reviewed.   The Patient Assistance has been approved until 03/06/22 and currently pending shipment within the next 10-14 business days.   Pt has been informed and expressed understanding.

## 2021-09-28 ENCOUNTER — Ambulatory Visit: Payer: Medicare Other | Admitting: Internal Medicine

## 2021-09-29 ENCOUNTER — Telehealth: Payer: Self-pay | Admitting: Internal Medicine

## 2021-09-29 NOTE — Telephone Encounter (Signed)
Called in to report she is taking Ozempic and can't eat, after a couple bites, she is full. She states she noticed on news about Ozempic and how it will not empty stomach and that's why she is not hungry.   Also states she has not had a bm x 3 days and is "scared."

## 2021-09-30 NOTE — Telephone Encounter (Signed)
Pt has been informed and expressed understanding. She would like to know if the medication will be switched to something else. Please advise.

## 2021-10-07 ENCOUNTER — Other Ambulatory Visit: Payer: Self-pay | Admitting: Internal Medicine

## 2021-10-07 DIAGNOSIS — F333 Major depressive disorder, recurrent, severe with psychotic symptoms: Secondary | ICD-10-CM

## 2021-10-07 DIAGNOSIS — F22 Delusional disorders: Secondary | ICD-10-CM

## 2021-10-07 DIAGNOSIS — F411 Generalized anxiety disorder: Secondary | ICD-10-CM

## 2021-10-08 ENCOUNTER — Ambulatory Visit: Payer: Medicare Other

## 2021-10-11 ENCOUNTER — Ambulatory Visit (INDEPENDENT_AMBULATORY_CARE_PROVIDER_SITE_OTHER): Payer: Medicare Other

## 2021-10-11 DIAGNOSIS — Z7901 Long term (current) use of anticoagulants: Secondary | ICD-10-CM

## 2021-10-11 DIAGNOSIS — D510 Vitamin B12 deficiency anemia due to intrinsic factor deficiency: Secondary | ICD-10-CM

## 2021-10-11 LAB — POCT INR: INR: 3.1 — AB (ref 2.0–3.0)

## 2021-10-11 MED ORDER — CYANOCOBALAMIN 1000 MCG/ML IJ SOLN
1000.0000 ug | Freq: Once | INTRAMUSCULAR | Status: AC
  Administered 2021-10-11: 1000 ug via INTRAMUSCULAR

## 2021-10-11 NOTE — Progress Notes (Addendum)
Reduce dose today to take 1/2 tablet and then continue 1 1/2 tablet daily except take 1 tablet on Sundays, Tuesdays and Thursdays. Recheck in 3 week.   Per orders of Dr. Elease Hashimoto in Dr. Adah Salvage absence, injection of B12 given in L deltoid given by Randall An. Patient tolerated injection well.

## 2021-10-11 NOTE — Patient Instructions (Addendum)
Pre visit review using our clinic review tool, if applicable. No additional management support is needed unless otherwise documented below in the visit note.  Reduce dose today to take 1/2 tablet and then continue 1 1/2 tablet daily except take 1 tablet on Sundays, Tuesdays and Thursdays. Recheck in 3 week.

## 2021-10-11 NOTE — Telephone Encounter (Signed)
Pt was in today at [facility] for INR check and reported she did stop taking Ozempic but has not regained her appetite. She has not heard yet if it will be replaced with a different medication. Advised pt a msg would be sen to PCP to inquire if another medication will be prescribed to replace Ozempic. Pt has f/u apt with PCP on 9/12

## 2021-10-12 DIAGNOSIS — M47816 Spondylosis without myelopathy or radiculopathy, lumbar region: Secondary | ICD-10-CM | POA: Diagnosis not present

## 2021-10-12 DIAGNOSIS — M150 Primary generalized (osteo)arthritis: Secondary | ICD-10-CM | POA: Diagnosis not present

## 2021-10-12 DIAGNOSIS — G894 Chronic pain syndrome: Secondary | ICD-10-CM | POA: Diagnosis not present

## 2021-10-12 DIAGNOSIS — Z79891 Long term (current) use of opiate analgesic: Secondary | ICD-10-CM | POA: Diagnosis not present

## 2021-10-13 ENCOUNTER — Telehealth: Payer: Medicare Other

## 2021-10-13 ENCOUNTER — Telehealth: Payer: Self-pay

## 2021-10-13 NOTE — Telephone Encounter (Signed)
Talked to pt about ozempic coming to pick it up, pt stated that she has been losing weight and does not need it, want a different replacement fr it. Talked tot Dr. Ronnald Ramp about pt request, feedback was that she can't have it because of a previous reaction she had with and it and will not have replacement for it since pt stated she loss weight without it.    Called Patient Assistance Program to relay message, LVM for them to call back

## 2021-10-14 ENCOUNTER — Telehealth: Payer: Self-pay | Admitting: Cardiovascular Disease

## 2021-10-14 ENCOUNTER — Telehealth: Payer: Self-pay | Admitting: *Deleted

## 2021-10-14 NOTE — Telephone Encounter (Signed)
Pt has appt 10/15/21

## 2021-10-14 NOTE — Telephone Encounter (Signed)
Pt c/o medication issue:  1. Name of Medication:   furosemide (LASIX) 20 MG tablet  spironolactone (ALDACTONE) 25 MG tablet  2. How are you currently taking this medication (dosage and times per day)?  As prescribed  3. Are you having a reaction (difficulty breathing--STAT)?   No  4. What is your medication issue?    Pt c/o swelling: STAT is pt has developed SOB within 24 hours  If swelling, where is the swelling located?   In both legs  How much weight have you gained and in what time span? No  Have you gained 3 pounds in a day or 5 pounds in a week?   4lbs in last 2 days  Do you have a log of your daily weights (if so, list)?   No  Are you currently taking a fluid pill? Yes  Are you currently SOB? A little  Have you traveled recently?   No  Patient stated she has been sleeping sitting up for the last two nights.  Patient is concerned about what she should be doing differently to control the swelling in her legs.

## 2021-10-14 NOTE — Telephone Encounter (Signed)
   Name: Shanya Ferriss  DOB: [birth year]  MRN: [id number]  Primary Cardiologist: Sherren Mocha, MD  Chart reviewed as part of pre-operative protocol coverage. Because of Thea Holshouser Dick's past medical history and time since last visit, she will require a follow-up in-office visit in order to better assess preoperative cardiovascular risk.  Patient called in today in separate phone note with worsening swelling and sleeping sitting up -> had been routed to triage. Needs in person OV to assess acute problems as well as pre-op eval and clearance to hold anticoagulation.  Pre-op covering staff: - Please schedule appointment and call patient to inform them. If patient already had an upcoming appointment within acceptable timeframe, please add "pre-op clearance" to the appointment notes so provider is aware. - Please contact requesting surgeon's office via preferred method (i.e, phone, fax) to inform them of need for appointment prior to surgery.  This message will also be routed to pharmacy pool for input on holding blood thinner as requested below so that this information is available to the clearing provider at time of patient's appointment.   Charlie Pitter, PA-C  10/14/2021, 1:31 PM

## 2021-10-14 NOTE — Telephone Encounter (Addendum)
FYI for OV 8/11 -> also pending preop. Prelim review showed no SBE needs but can be finalized at visit tmrw.

## 2021-10-14 NOTE — Telephone Encounter (Signed)
Called patient about her message. Patient complaining of BLE pitting edema that started yesterday. Patient stated her legs feel tight and hard. Patient stated she had gain 4 pounds in the last two days, but patient has been off ozempic for 2 weeks. Patient stated she does not have an appetite and she is not eating a lot.  Patient has a history of low EF. She is on Entresto 97/103 mg BID, Furosemide 20 mg daily, and aldactone 25 mg daily. Made patient an appointment with DOD, Dr. Irish Lack tomorrow. Will forward to Dr. Burt Knack as well. Encouraged patient to keep her feet elevated and keep a low salt diet.

## 2021-10-14 NOTE — Telephone Encounter (Signed)
   Pre-operative Risk Assessment    Patient Name: Kristen [name]  DOB: 1946/03/06 MRN: [id number]     Request for Surgical Clearance    Procedure:  Dental Extraction - Amount of Teeth to be Pulled:  5 TEETH EXTRACTED  Date of Surgery:  Clearance TBD                                 Surgeon:  DR. Leone Haven, DDS Surgeon's Group or Practice Name:  [facility] Phone number:  [phone] Fax number:  [phone] ATTN: ROBIN BRANT   Type of Clearance Requested:   - Medical  - Pharmacy:  Hold Warfarin (Coumadin)     Type of Anesthesia:  Local  WITH EPI/LIDOCAINE/SEPTOCAINE   Additional requests/questions:    Jiles Prows   10/14/2021, 1:06 PM

## 2021-10-14 NOTE — Telephone Encounter (Signed)
Patient with diagnosis of afib on warfarin for anticoagulation.    Procedure: 5 dental extractions Date of procedure: TBD  CHA2DS2-VASc Score = 8  This indicates a 10.8% annual risk of stroke. The patient's score is based upon: CHF History: 1 HTN History: 1 Diabetes History: 1 Stroke History: 2 Vascular Disease History: 1 Age Score: 1 Gender Score: 1  CrCl 25m/min using adjusted body weight due to obesity Platelet count 349K  Patient does not require pre-op antibiotics for dental procedure.  Per office protocol, patient can hold warfarin for 5 days prior to procedure. Patient will need bridging with Lovenox around procedure. This should be coordinated by PCP office who manages pt's anticoagulation.  **This guidance is not considered finalized until pre-operative APP has relayed final recommendations.**

## 2021-10-15 ENCOUNTER — Ambulatory Visit: Payer: Medicare Other | Admitting: Interventional Cardiology

## 2021-10-20 NOTE — Telephone Encounter (Signed)
Doesn't look like she came for DOD visit. Has appt scheduled with me 8/28

## 2021-10-21 NOTE — Telephone Encounter (Signed)
Pt is scheduled to see Dr. Burt Knack, 11/01/21, clearance will be addressed at that time.

## 2021-10-26 ENCOUNTER — Telehealth: Payer: Self-pay

## 2021-10-26 NOTE — Telephone Encounter (Signed)
Pt LVM last night reporting she fell the night before and has a bruise/bump on her lower left arm. Pt reports her head also bouncing off of the carpeted floor. Pt denies losing consciousness or any head injury. Pt denies any other injuries. Pt is concerned that the bump on her arm could be a blood clot. Advised pt she would need to be assessed in office by a provider to determine if it was or not. Pt said she thinks the size of the bump has gone down and she thinks at this time she will just keep an eye on it and if anything worsens or she develops any new symptoms she will contact office or go to ER. Pt requested her coumadin clinic apt be moved up from next week to this Friday. RS apt for Friday. Advised if anything changes contact office or go to ER. Pt verbalized understanding.

## 2021-10-27 ENCOUNTER — Telehealth: Payer: Self-pay

## 2021-10-27 ENCOUNTER — Telehealth: Payer: Self-pay | Admitting: Cardiovascular Disease

## 2021-10-27 NOTE — Telephone Encounter (Signed)
Pt c/o Shortness Of Breath: STAT if SOB developed within the last 24 hours or pt is noticeably SOB on the phone  1. Are you currently SOB (can you hear that pt is SOB on the phone)? yes  2. How long have you been experiencing SOB? A month  3. Are you SOB when sitting or when up moving around? Moving around  4. Are you currently experiencing any other symptoms? Swelling in feet and ankles

## 2021-10-27 NOTE — Telephone Encounter (Signed)
Returned call to patient. She states that she is able to get socks and shoes on, but her feet are noticeably larger than usual. Denies wearing compression socks, educated on benefit of them. Advised that she weigh herself daily as fluid will be an issue for her going forward since she has CHF. Asked her to remove her sock at time of call, she states there's no indention in her ankle or foot. She understands to elevate her feet throughout the day today and tonight. She will call us if fluid is worse tomorrow, or doesn't subside. States that her SOB episode happened this morning and she is easily panicked and this anxiety may have caused her to feel worse. States she has not felt this way since then, but also has not gotten up to walk around. Advised that she attempt to resume normal activity and if she has anymore episodes of this to call us and we would get her scheduled to see MD.

## 2021-10-27 NOTE — Telephone Encounter (Signed)
Pt called in to report had an episode of increased SOB last night/ early this morning while walking from chair in room to door.  Had to sit down to catch breath. This was a 1 time occurrence.  Has SOB but not as bad as this event.  Also, reports bilateral ankle and feet swelling.  Does not weigh self daily.  Advised pt that should start to determine if retaining fluid.  Wt 8/22- 167 lbs.  BP from 5 days ago 104/64 HR in the 90's.  Reports takes furosemide, spironolactone, entresto, metoprolol and warfarin as ordered. Advised will route to Primary RN to follow up.

## 2021-10-29 ENCOUNTER — Ambulatory Visit (INDEPENDENT_AMBULATORY_CARE_PROVIDER_SITE_OTHER): Payer: Medicare Other

## 2021-10-29 DIAGNOSIS — Z7901 Long term (current) use of anticoagulants: Secondary | ICD-10-CM

## 2021-10-29 DIAGNOSIS — D510 Vitamin B12 deficiency anemia due to intrinsic factor deficiency: Secondary | ICD-10-CM | POA: Diagnosis not present

## 2021-10-29 LAB — POCT INR: INR: 4 — AB (ref 2.0–3.0)

## 2021-10-29 MED ORDER — CYANOCOBALAMIN 1000 MCG/ML IJ SOLN
1000.0000 ug | Freq: Once | INTRAMUSCULAR | Status: AC
  Administered 2021-10-29: 1000 ug via INTRAMUSCULAR

## 2021-10-29 NOTE — Progress Notes (Signed)
Pt has lost all appetite and is only eating sparingly. Pt also had a fall 5 days ago and has a bruise on R forearm.   Hold dose today and then change weekly dose to take 1 tablet daily except take 1 1/2 tablets on Mondays. Recheck in 2 weeks. Pt requested B12 injection. Pt is due for injection. Administered B12 injection in L deltoid per pt request. Pt tolerated well.

## 2021-10-29 NOTE — Patient Instructions (Addendum)
Pre visit review using our clinic review tool, if applicable. No additional management support is needed unless otherwise documented below in the visit note.  Hold dose today and then change weekly dose to take 1 tablet daily except take 1 1/2 tablets on Mondays. Recheck in 2 weeks.

## 2021-11-01 ENCOUNTER — Ambulatory Visit: Payer: Medicare Other | Attending: Cardiovascular Disease | Admitting: Cardiovascular Disease

## 2021-11-01 ENCOUNTER — Encounter: Payer: Self-pay | Admitting: Cardiovascular Disease

## 2021-11-01 VITALS — BP 106/70 | HR 86 | Ht 61.0 in | Wt 171.2 lb

## 2021-11-01 DIAGNOSIS — I5022 Chronic systolic (congestive) heart failure: Secondary | ICD-10-CM

## 2021-11-01 DIAGNOSIS — I2510 Atherosclerotic heart disease of native coronary artery without angina pectoris: Secondary | ICD-10-CM | POA: Diagnosis not present

## 2021-11-01 DIAGNOSIS — I480 Paroxysmal atrial fibrillation: Secondary | ICD-10-CM

## 2021-11-01 NOTE — Patient Instructions (Signed)
Medication Instructions:  Your physician recommends that you continue on your current medications as directed. Please refer to the Current Medication list given to you today.  *If you need a refill on your cardiac medications before your next appointment, please call your pharmacy*   Lab Work: CBC, CMET, BNP today If you have labs (blood work) drawn today and your tests are completely normal, you will receive your results only by: [place] (if you have MyChart) OR A paper copy in the mail If you have any lab test that is abnormal or we need to change your treatment, we will call you to review the results.   Testing/Procedures: ECHO Your physician has requested that you have an echocardiogram. Echocardiography is a painless test that uses sound waves to create images of your heart. It provides your doctor with information about the size and shape of your heart and how well your heart's chambers and valves are working. This procedure takes approximately one hour. There are no restrictions for this procedure.  Follow-Up: At [facility], you and your health needs are our priority.  As part of our continuing mission to provide you with exceptional heart care, we have created designated Provider Care Teams.  These Care Teams include your primary Cardiologist (physician) and Advanced Practice Providers (APPs -  Physician Assistants and Nurse Practitioners) who all work together to provide you with the care you need, when you need it.  Your next appointment:   3 month(s)  The format for your next appointment:   In Person  Provider:   Sherren Mocha, MD       Important Information About Sugar

## 2021-11-01 NOTE — Progress Notes (Unsigned)
Cardiology Office Note:    Date:  11/01/2021   ID:  Kristen [name], Kristen [name] 10/23/1945, MRN [id number]  PCP:  [name], MD   [place] Providers Cardiologist:  Sherren Mocha, MD     Referring MD: [name], MD   Chief Complaint  Patient presents with   Fatigue    History of Present Illness:    Kristen [name] is a 76 y.o. female with a hx of coronary artery disease status post CABG in 2004.  Other medical problems include paroxysmal atrial fibrillation on warfarin, history of stroke, obesity, diabetes, hypertension, and left bundle branch block.  At the time of her last visit in February 2023, an echocardiogram was ordered to reassess LV function in the setting of chronic left bundle branch block.  Her baseline LVEF from 2016 was in the range of 50 to 55%.  Her updated echo demonstrated severe global LV dysfunction with LVEF 25 to 30%.  Her medications were adjusted and she was started on GDMT for heart failure.  She was referred to the Pharm.D. clinic and now is on an excellent regimen that includes Jardiance, furosemide, metoprolol succinate, Entresto, and spironolactone.  The patient is here alone today for follow-up.  She has developed more problems with leg swelling.  She denies orthopnea or PND.  She has no chest pain or pressure.  She has had a few episodes of shortness of breath but no major problems with her breathing of late.  She feels very fatigued and this has been a progressive issue for her.  She has lost weight and has complained of poor appetite.  Past Medical History:  Diagnosis Date   Arthritis    Blood transfusion without reported diagnosis    Bursitis    CAD (coronary artery disease)    a. s/p CABG 2004.b. stable cath 2014 demonstrating stable CAD and continued patency of her LIMA graft.   Chronic anticoagulation    on coumadin   DDD (degenerative disc disease), lumbar    Depression    Diabetes mellitus    Diastolic dysfunction     per echo in October 2012 with EF 50 to 55%   Fibromyalgia    GERD (gastroesophageal reflux disease) 10/23/2003   Headache(784.0)    Hyperlipidemia    Hypertension    Hypokalemia    LBBB (left bundle branch block)    Lumbar back pain    LV dysfunction    a. EF 45% by cath 2014. b. EF 50-55% by technically difficult echo in 08/2014.   Lymphadenitis    Morbid obesity ([place])    a. Sleep study negative for significant OSA in 11/2014.   PAF (paroxysmal atrial fibrillation) ([place])    Stroke ([place]) 2004   affected speech per pt    Past Surgical History:  Procedure Laterality Date   ABDOMINAL HYSTERECTOMY     ANGIOPLASTY  laminectomy   CHOLECYSTECTOMY     CORONARY ARTERY BYPASS GRAFT     LIMA to LAD    KNEE ARTHROSCOPY     LEFT HEART CATHETERIZATION WITH CORONARY/GRAFT ANGIOGRAM  12/07/2012   Procedure: LEFT HEART CATHETERIZATION WITH Beatrix Fetters;  Surgeon: Blane Ohara, MD;  Location: [facility] CATH LAB;  Service: Cardiovascular;;   LUMBAR LAMINECTOMY     x3   SPHINCTEROTOMY     TONSILLECTOMY      Current Medications: Current Meds  Medication Sig   ALPRAZolam (XANAX) 1 MG tablet TAKE ONE TABLET BY MOUTH EVERY MORNING, TAKE  1/2 TABLET midday, AND TAKE 1/2 TABLET AT BEDTIME   atorvastatin (LIPITOR) 40 MG tablet Take 1 tablet (40 mg total) by mouth daily. (Patient taking differently: Take 40 mg by mouth at bedtime.)   Blood Glucose Monitoring Suppl (ONE TOUCH ULTRA 2) w/Device KIT Inject 1 Act into the skin 3 (three) times daily. Use TID   diclofenac Sodium (VOLTAREN) 1 % GEL Apply 2 g topically 4 (four) times daily.   furosemide (LASIX) 20 MG tablet Take 1 tablet (20 mg total) by mouth daily.   gabapentin (NEURONTIN) 300 MG capsule Take ONE capsule by MOUTH three times daily   glucose blood (COOL BLOOD GLUCOSE TEST STRIPS) test strip 1 each by Other route 3 (three) times daily. Use to test blood sugar TID. DX: E11.9   insulin glargine, 2 Unit Dial, (TOUJEO MAX SOLOSTAR) 300  UNIT/ML Solostar Pen Inject 30 Units into the skin daily. (Patient taking differently: Inject 50 Units into the skin at bedtime.)   JARDIANCE 25 MG TABS tablet Take 1 tablet (25 mg total) by mouth daily. Via BI CARES pt assistance   Lancets (ONETOUCH ULTRASOFT) lancets Use as instructed   magnesium oxide (MAG-OX) 400 (240 Mg) MG tablet Take 1 tablet (400 mg total) by mouth 2 (two) times daily. Take in the morning and evening.   meclizine (ANTIVERT) 25 MG tablet Take 1 tablet (25 mg total) by mouth 3 (three) times daily as needed for dizziness.   metFORMIN (GLUCOPHAGE-XR) 500 MG 24 hr tablet Take 3 tablets (1,500 mg total) by mouth daily with breakfast.   metoprolol succinate (TOPROL-XL) 100 MG 24 hr tablet TAKE ONE TABLET BY MOUTH EVERY MORNING and TAKE ONE TABLET BY MOUTH EVERYDAY AT BEDTIME   nitroGLYCERIN (NITROSTAT) 0.4 MG SL tablet Place 1 tablet (0.4 mg total) under the tongue every 5 (five) minutes as needed for chest pain (x 3 doses). Reported on 05/08/2015   NOVOFINE PEN NEEDLE 32G X 6 MM MISC USE with insulin AS DIRECTED   oxyCODONE-acetaminophen (PERCOCET) 10-325 MG tablet Take 1 tablet by mouth every 4 (four) hours as needed for pain.   polyethylene glycol (MIRALAX / GLYCOLAX) 17 g packet Take 17 g by mouth daily.   potassium chloride SA (KLOR-CON M) 20 MEQ tablet TAKE ONE TABLET BY MOUTH EVERY MORNING and TAKE ONE TABLET BY MOUTH EVERYDAY AT BEDTIME   sacubitril-valsartan (ENTRESTO) 97-103 MG Take 1 tablet by mouth 2 (two) times daily.   spironolactone (ALDACTONE) 25 MG tablet TAKE ONE TABLET BY MOUTH ONCE DAILY (Patient taking differently: Take 25 mg by mouth daily.)   thiamine 100 MG tablet Take 1 tablet (100 mg total) by mouth daily.   warfarin (COUMADIN) 3 MG tablet TAKE ONE TABLET BY MOUTH DAILY EXCEPT ON MONDAY AND THURSDAY TAKE 1 AND 1/2 TABLETS OR AS DIRECTED by coumadin clinic     Allergies:   Metformin and related, Cleocin [clindamycin hcl], Codeine, Doxycycline hyclate,  Macrolides and ketolides, Morphine, Pentazocine lactate, Vibramycin [doxycycline calcium], Clindamycin, Definity [perflutren lipid microsphere], Doxycycline, Metformin, Pentazocine, Perflutren, Perflutren lipid microspheres, Sulfa antibiotics, and Sulfonamide derivatives   Social History   Socioeconomic History   Marital status: Married    Spouse name: Not on file   Number of children: Not on file   Years of education: Not on file   Highest education level: Not on file  Occupational History   Not on file  Tobacco Use   Smoking status: Former    Types: Cigarettes    Quit date: 03/07/2001  Years since quitting: 20.6    Passive exposure: Current   Smokeless tobacco: Never  Vaping Use   Vaping Use: Never used  Substance and Sexual Activity   Alcohol use: No   Drug use: No   Sexual activity: Not Currently    Partners: Male  Other Topics Concern   Not on file  Social History Narrative   Lives locally, has help available if needed.   Social Determinants of Health   Financial Resource Strain: Medium Risk (01/25/2021)   Overall Financial Resource Strain (CARDIA)    Difficulty of Paying Living Expenses: Somewhat hard  Food Insecurity: No Food Insecurity (05/06/2021)   Hunger Vital Sign    Worried About Running Out of Food in the Last Year: Never true    Ran Out of Food in the Last Year: Never true  Transportation Needs: No Transportation Needs (05/06/2021)   PRAPARE - Hydrologist (Medical): No    Lack of Transportation (Non-Medical): No  Physical Activity: Inactive (01/25/2021)   Exercise Vital Sign    Days of Exercise per Week: 0 days    Minutes of Exercise per Session: 0 min  Stress: Stress Concern Present (06/21/2021)   [place]    Feeling of Stress : Rather much  Social Connections: Moderately Isolated (01/25/2021)   Social Connection and Isolation Panel [NHANES]    Frequency of  Communication with Friends and Family: Twice a week    Frequency of Social Gatherings with Friends and Family: Twice a week    Attends Religious Services: Never    Marine scientist or Organizations: No    Attends Music therapist: Never    Marital Status: Married     Family History: The patient's family history includes Anxiety disorder in her maternal aunt; Arthritis in an other family member; Colon cancer in her paternal uncle; Depression in her sister; Diabetes in an other family member; Heart attack in her father; Heart disease in her father; Hypertension in an other family member; Kidney disease in an other family member; Stroke in her mother and another family member.  ROS:   Please see the history of present illness.    All other systems reviewed and are negative.  EKGs/Labs/Other Studies Reviewed:    The following studies were reviewed today: Echo 05/13/21:  1. Left ventricular ejection fraction, by estimation, is 25 to 30%. The  left ventricle has severely decreased function. The left ventricle  demonstrates global hypokinesis. Left ventricular diastolic parameters are  consistent with Grade I diastolic  dysfunction (impaired relaxation). Elevated left ventricular end-diastolic  pressure.   2. Right ventricular systolic function is normal. The right ventricular  size is normal.   3. The mitral valve is normal in structure. Trivial mitral valve  regurgitation. No evidence of mitral stenosis.   4. The aortic valve is normal in structure. Aortic valve regurgitation is  not visualized. Aortic valve sclerosis/calcification is present, without  any evidence of aortic stenosis.   5. The inferior vena cava is normal in size with greater than 50%  respiratory variability, suggesting right atrial pressure of 3 mmHg.   Event monitor 06/22/2021: SUMMARY: Basic rhythm is normal sinus with an average HR of 86 bpm. There is a short supraventricular run of 9 beats,  otherwise rare ectopics. No AFib or bradycardic events. No sustained arrhythmias.   EKG:  EKG today shows normal sinus rhythm 86 bpm, rightward axis, nonspecific IVCD,  ST and T wave abnormality consider inferolateral ischemia but likely related to conduction delay.  Recent Labs: 07/27/2021: ALT 38; Hemoglobin 12.0; Magnesium 1.8; Platelets 349 08/24/2021: BUN 17; Creatinine, Ser 0.92; Potassium 4.1; Sodium 141 09/08/2021: TSH 2.67  Recent Lipid Panel    Component Value Date/Time   CHOL 98 09/21/2020 1425   CHOL 108 12/26/2017 0758   TRIG 126.0 09/21/2020 1425   HDL 34.60 (L) 09/21/2020 1425   HDL 38 (L) 12/26/2017 0758   CHOLHDL 3 09/21/2020 1425   VLDL 25.2 09/21/2020 1425   LDLCALC 39 09/21/2020 1425   LDLCALC 48 12/26/2017 0758   LDLDIRECT 116.0 11/28/2008 1122     Risk Assessment/Calculations:    CHA2DS2-VASc Score = 8  {Confirm score is correct.  If not, click here to update score.  REFRESH note.  :1} This indicates a 10.8% annual risk of stroke. The patient's score is based upon: CHF History: 1 HTN History: 1 Diabetes History: 1 Stroke History: 2 Vascular Disease History: 1 Age Score: 1 Gender Score: 1   {This patient has a significant risk of stroke if diagnosed with atrial fibrillation.  Please consider VKA or DOAC agent for anticoagulation if the bleeding risk is acceptable.   You can also use the SmartPhrase .[email or web address] for documentation.   :[id number]}            Physical Exam:    VS:  BP 106/70   Pulse 86   Ht $R'5\' 1"'IP$  (1.549 m)   Wt 171 lb 3.2 oz (77.7 kg)   SpO2 97%   BMI 32.35 kg/m     Wt Readings from Last 3 Encounters:  11/01/21 171 lb 3.2 oz (77.7 kg)  09/01/21 173 lb (78.5 kg)  08/24/21 170 lb (77.1 kg)     GEN:  Well nourished, well developed in no acute distress HEENT: Normal NECK: No JVD; No carotid bruits LYMPHATICS: No lymphadenopathy CARDIAC: RRR, no murmurs, rubs, gallops RESPIRATORY:  Clear to auscultation without rales, wheezing  or rhonchi  ABDOMEN: Soft, non-tender, non-distended MUSCULOSKELETAL: 1+ pretibial/ankle edema bilaterally; No deformity  SKIN: Warm and dry NEUROLOGIC:  Alert and oriented x 3 PSYCHIATRIC:  Normal affect   ASSESSMENT:    1. Chronic systolic heart failure ([place])   2. Coronary artery disease involving native coronary artery of native heart without angina pectoris   3. PAF (paroxysmal atrial fibrillation) ([facility])    PLAN:    In order of problems listed above:  The patient has progressive symptoms of fatigue and lower extremity edema.  Her exam, other than leg edema, is fairly benign.  She reports NYHA functional class II-III symptoms at present.  She is on an excellent medical regimen as outlined above.  Now with 6 months of good medical therapy, I have recommended a repeat echo study.  We will also check a metabolic panel, CBC, and BNP.  Further recommendations based on the results of her echocardiogram.  If she continues to have severe LV dysfunction, I will plan to proceed with a right and left heart catheterization with graft angiography.  Based on the left bundle branch block and global pattern of LV dysfunction, I suspect her worsening LV dysfunction is not related to progressive CAD but this will need to be ruled out if she has not improved with medical therapy.  Also might refer to the advanced heart failure clinic if there is no improvement in her LV function. Remains stable on current regimen which includes atorvastatin 40 mg daily, high-dose metoprolol succinate, and  chronic anticoagulation. No atrial fib was seen on her recent monitor.  She tolerates anticoagulation with warfarin and remains on a beta-blocker as above.           Medication Adjustments/Labs and Tests Ordered: Current medicines are reviewed at length with the patient today.  Concerns regarding medicines are outlined above.  Orders Placed This Encounter  Procedures   CBC   Comprehensive metabolic panel   Pro b  natriuretic peptide (BNP)   EKG 12-Lead   ECHOCARDIOGRAM COMPLETE   No orders of the defined types were placed in this encounter.   Patient Instructions  Medication Instructions:  Your physician recommends that you continue on your current medications as directed. Please refer to the Current Medication list given to you today.  *If you need a refill on your cardiac medications before your next appointment, please call your pharmacy*   Lab Work: CBC, CMET, BNP today If you have labs (blood work) drawn today and your tests are completely normal, you will receive your results only by: [place] (if you have MyChart) OR A paper copy in the mail If you have any lab test that is abnormal or we need to change your treatment, we will call you to review the results.   Testing/Procedures: ECHO Your physician has requested that you have an echocardiogram. Echocardiography is a painless test that uses sound waves to create images of your heart. It provides your doctor with information about the size and shape of your heart and how well your heart's chambers and valves are working. This procedure takes approximately one hour. There are no restrictions for this procedure.  Follow-Up: At [facility], you and your health needs are our priority.  As part of our continuing mission to provide you with exceptional heart care, we have created designated Provider Care Teams.  These Care Teams include your primary Cardiologist (physician) and Advanced Practice Providers (APPs -  Physician Assistants and Nurse Practitioners) who all work together to provide you with the care you need, when you need it.  Your next appointment:   3 month(s)  The format for your next appointment:   In Person  Provider:   Sherren Mocha, MD       Important Information About Sugar         Signed, Sherren Mocha, MD  11/01/2021 2:50 PM    [place]

## 2021-11-01 NOTE — H&P (View-Only) (Signed)
Cardiology Office Note:    Date:  11/01/2021   ID:  Kristen [name], Kristen [name] 04-Nov-1945, MRN [id number]  PCP:  [name], MD   [facility] Cardiologist:  Sherren Mocha, MD     Referring MD: [name], MD   Chief Complaint  Patient presents with   Fatigue    History of Present Illness:    Kristen [name] is a 76 y.o. female with a hx of coronary artery disease status post CABG in 2004.  Other medical problems include paroxysmal atrial fibrillation on warfarin, history of stroke, obesity, diabetes, hypertension, and left bundle branch block.  At the time of her last visit in February 2023, an echocardiogram was ordered to reassess LV function in the setting of chronic left bundle branch block.  Her baseline LVEF from 2016 was in the range of 50 to 55%.  Her updated echo demonstrated severe global LV dysfunction with LVEF 25 to 30%.  Her medications were adjusted and she was started on GDMT for heart failure.  She was referred to the Pharm.D. clinic and now is on an excellent regimen that includes Jardiance, furosemide, metoprolol succinate, Entresto, and spironolactone.  The patient is here alone today for follow-up.  She has developed more problems with leg swelling.  She denies orthopnea or PND.  She has no chest pain or pressure.  She has had a few episodes of shortness of breath but no major problems with her breathing of late.  She feels very fatigued and this has been a progressive issue for her.  She has lost weight and has complained of poor appetite.  Past Medical History:  Diagnosis Date   Arthritis    Blood transfusion without reported diagnosis    Bursitis    CAD (coronary artery disease)    a. s/p CABG 2004.b. stable cath 2014 demonstrating stable CAD and continued patency of her LIMA graft.   Chronic anticoagulation    on coumadin   DDD (degenerative disc disease), lumbar    Depression    Diabetes mellitus    Diastolic dysfunction     per echo in October 2012 with EF 50 to 55%   Fibromyalgia    GERD (gastroesophageal reflux disease) 10/23/2003   Headache(784.0)    Hyperlipidemia    Hypertension    Hypokalemia    LBBB (left bundle branch block)    Lumbar back pain    LV dysfunction    a. EF 45% by cath 2014. b. EF 50-55% by technically difficult echo in 08/2014.   Lymphadenitis    Morbid obesity ([place])    a. Sleep study negative for significant OSA in 11/2014.   PAF (paroxysmal atrial fibrillation) ([place])    Stroke ([place]) 2004   affected speech per pt    Past Surgical History:  Procedure Laterality Date   ABDOMINAL HYSTERECTOMY     ANGIOPLASTY  laminectomy   CHOLECYSTECTOMY     CORONARY ARTERY BYPASS GRAFT     LIMA to LAD    KNEE ARTHROSCOPY     LEFT HEART CATHETERIZATION WITH CORONARY/GRAFT ANGIOGRAM  12/07/2012   Procedure: LEFT HEART CATHETERIZATION WITH Beatrix Fetters;  Surgeon: Blane Ohara, MD;  Location: [facility] CATH LAB;  Service: Cardiovascular;;   LUMBAR LAMINECTOMY     x3   SPHINCTEROTOMY     TONSILLECTOMY      Current Medications: Current Meds  Medication Sig   ALPRAZolam (XANAX) 1 MG tablet TAKE ONE TABLET BY MOUTH EVERY MORNING, TAKE  1/2 TABLET midday, AND TAKE 1/2 TABLET AT BEDTIME   atorvastatin (LIPITOR) 40 MG tablet Take 1 tablet (40 mg total) by mouth daily. (Patient taking differently: Take 40 mg by mouth at bedtime.)   Blood Glucose Monitoring Suppl (ONE TOUCH ULTRA 2) w/Device KIT Inject 1 Act into the skin 3 (three) times daily. Use TID   diclofenac Sodium (VOLTAREN) 1 % GEL Apply 2 g topically 4 (four) times daily.   furosemide (LASIX) 20 MG tablet Take 1 tablet (20 mg total) by mouth daily.   gabapentin (NEURONTIN) 300 MG capsule Take ONE capsule by MOUTH three times daily   glucose blood (COOL BLOOD GLUCOSE TEST STRIPS) test strip 1 each by Other route 3 (three) times daily. Use to test blood sugar TID. DX: E11.9   insulin glargine, 2 Unit Dial, (TOUJEO MAX SOLOSTAR) 300  UNIT/ML Solostar Pen Inject 30 Units into the skin daily. (Patient taking differently: Inject 50 Units into the skin at bedtime.)   JARDIANCE 25 MG TABS tablet Take 1 tablet (25 mg total) by mouth daily. Via [employer] pt assistance   Lancets (ONETOUCH ULTRASOFT) lancets Use as instructed   magnesium oxide (MAG-OX) 400 (240 Mg) MG tablet Take 1 tablet (400 mg total) by mouth 2 (two) times daily. Take in the morning and evening.   meclizine (ANTIVERT) 25 MG tablet Take 1 tablet (25 mg total) by mouth 3 (three) times daily as needed for dizziness.   metFORMIN (GLUCOPHAGE-XR) 500 MG 24 hr tablet Take 3 tablets (1,500 mg total) by mouth daily with breakfast.   metoprolol succinate (TOPROL-XL) 100 MG 24 hr tablet TAKE ONE TABLET BY MOUTH EVERY MORNING and TAKE ONE TABLET BY MOUTH EVERYDAY AT BEDTIME   nitroGLYCERIN (NITROSTAT) 0.4 MG SL tablet Place 1 tablet (0.4 mg total) under the tongue every 5 (five) minutes as needed for chest pain (x 3 doses). Reported on 05/08/2015   NOVOFINE PEN NEEDLE 32G X 6 MM MISC USE with insulin AS DIRECTED   oxyCODONE-acetaminophen (PERCOCET) 10-325 MG tablet Take 1 tablet by mouth every 4 (four) hours as needed for pain.   polyethylene glycol (MIRALAX / GLYCOLAX) 17 g packet Take 17 g by mouth daily.   potassium chloride SA (KLOR-CON M) 20 MEQ tablet TAKE ONE TABLET BY MOUTH EVERY MORNING and TAKE ONE TABLET BY MOUTH EVERYDAY AT BEDTIME   sacubitril-valsartan (ENTRESTO) 97-103 MG Take 1 tablet by mouth 2 (two) times daily.   spironolactone (ALDACTONE) 25 MG tablet TAKE ONE TABLET BY MOUTH ONCE DAILY (Patient taking differently: Take 25 mg by mouth daily.)   thiamine 100 MG tablet Take 1 tablet (100 mg total) by mouth daily.   warfarin (COUMADIN) 3 MG tablet TAKE ONE TABLET BY MOUTH DAILY EXCEPT ON MONDAY AND THURSDAY TAKE 1 AND 1/2 TABLETS OR AS DIRECTED by coumadin clinic     Allergies:   Metformin and related, Cleocin [clindamycin hcl], Codeine, Doxycycline hyclate,  Macrolides and ketolides, Morphine, Pentazocine lactate, Vibramycin [doxycycline calcium], Clindamycin, Definity [perflutren lipid microsphere], Doxycycline, Metformin, Pentazocine, Perflutren, Perflutren lipid microspheres, Sulfa antibiotics, and Sulfonamide derivatives   Social History   Socioeconomic History   Marital status: Married    Spouse name: Not on file   Number of children: Not on file   Years of education: Not on file   Highest education level: Not on file  Occupational History   Not on file  Tobacco Use   Smoking status: Former    Types: Cigarettes    Quit date: 03/07/2001  Years since quitting: 20.6    Passive exposure: Current   Smokeless tobacco: Never  Vaping Use   Vaping Use: Never used  Substance and Sexual Activity   Alcohol use: No   Drug use: No   Sexual activity: Not Currently    Partners: Male  Other Topics Concern   Not on file  Social History Narrative   Lives locally, has help available if needed.   Social Determinants of Health   Financial Resource Strain: Medium Risk (01/25/2021)   Overall Financial Resource Strain (CARDIA)    Difficulty of Paying Living Expenses: Somewhat hard  Food Insecurity: No Food Insecurity (05/06/2021)   Hunger Vital Sign    Worried About Running Out of Food in the Last Year: Never true    Ran Out of Food in the Last Year: Never true  Transportation Needs: No Transportation Needs (05/06/2021)   PRAPARE - Hydrologist (Medical): No    Lack of Transportation (Non-Medical): No  Physical Activity: Inactive (01/25/2021)   Exercise Vital Sign    Days of Exercise per Week: 0 days    Minutes of Exercise per Session: 0 min  Stress: Stress Concern Present (06/21/2021)   [place]    Feeling of Stress : Rather much  Social Connections: Moderately Isolated (01/25/2021)   Social Connection and Isolation Panel [NHANES]    Frequency of  Communication with Friends and Family: Twice a week    Frequency of Social Gatherings with Friends and Family: Twice a week    Attends Religious Services: Never    Marine scientist or Organizations: No    Attends Music therapist: Never    Marital Status: Married     Family History: The patient's family history includes Anxiety disorder in her maternal aunt; Arthritis in an other family member; Colon cancer in her paternal uncle; Depression in her sister; Diabetes in an other family member; Heart attack in her father; Heart disease in her father; Hypertension in an other family member; Kidney disease in an other family member; Stroke in her mother and another family member.  ROS:   Please see the history of present illness.    All other systems reviewed and are negative.  EKGs/Labs/Other Studies Reviewed:    The following studies were reviewed today: Echo 05/13/21:  1. Left ventricular ejection fraction, by estimation, is 25 to 30%. The  left ventricle has severely decreased function. The left ventricle  demonstrates global hypokinesis. Left ventricular diastolic parameters are  consistent with Grade I diastolic  dysfunction (impaired relaxation). Elevated left ventricular end-diastolic  pressure.   2. Right ventricular systolic function is normal. The right ventricular  size is normal.   3. The mitral valve is normal in structure. Trivial mitral valve  regurgitation. No evidence of mitral stenosis.   4. The aortic valve is normal in structure. Aortic valve regurgitation is  not visualized. Aortic valve sclerosis/calcification is present, without  any evidence of aortic stenosis.   5. The inferior vena cava is normal in size with greater than 50%  respiratory variability, suggesting right atrial pressure of 3 mmHg.   Event monitor 06/22/2021: SUMMARY: Basic rhythm is normal sinus with an average HR of 86 bpm. There is a short supraventricular run of 9 beats,  otherwise rare ectopics. No AFib or bradycardic events. No sustained arrhythmias.   EKG:  EKG today shows normal sinus rhythm 86 bpm, rightward axis, nonspecific IVCD,  ST and T wave abnormality consider inferolateral ischemia but likely related to conduction delay.  Recent Labs: 07/27/2021: ALT 38; Hemoglobin 12.0; Magnesium 1.8; Platelets 349 08/24/2021: BUN 17; Creatinine, Ser 0.92; Potassium 4.1; Sodium 141 09/08/2021: TSH 2.67  Recent Lipid Panel    Component Value Date/Time   CHOL 98 09/21/2020 1425   CHOL 108 12/26/2017 0758   TRIG 126.0 09/21/2020 1425   HDL 34.60 (L) 09/21/2020 1425   HDL 38 (L) 12/26/2017 0758   CHOLHDL 3 09/21/2020 1425   VLDL 25.2 09/21/2020 1425   LDLCALC 39 09/21/2020 1425   LDLCALC 48 12/26/2017 0758   LDLDIRECT 116.0 11/28/2008 1122     Risk Assessment/Calculations:    CHA2DS2-VASc Score = 8   This indicates a 10.8% annual risk of stroke. The patient's score is based upon: CHF History: 1 HTN History: 1 Diabetes History: 1 Stroke History: 2 Vascular Disease History: 1 Age Score: 1 Gender Score: 1               Physical Exam:    VS:  BP 106/70   Pulse 86   Ht $R'5\' 1"'fY$  (1.549 m)   Wt 171 lb 3.2 oz (77.7 kg)   SpO2 97%   BMI 32.35 kg/m     Wt Readings from Last 3 Encounters:  11/01/21 171 lb 3.2 oz (77.7 kg)  09/01/21 173 lb (78.5 kg)  08/24/21 170 lb (77.1 kg)     GEN:  Well nourished, well developed in no acute distress HEENT: Normal NECK: No JVD; No carotid bruits LYMPHATICS: No lymphadenopathy CARDIAC: RRR, no murmurs, rubs, gallops RESPIRATORY:  Clear to auscultation without rales, wheezing or rhonchi  ABDOMEN: Soft, non-tender, non-distended MUSCULOSKELETAL: 1+ pretibial/ankle edema bilaterally; No deformity  SKIN: Warm and dry NEUROLOGIC:  Alert and oriented x 3 PSYCHIATRIC:  Normal affect   ASSESSMENT:    1. Chronic systolic heart failure ([place])   2. Coronary artery disease involving native coronary artery of native  heart without angina pectoris   3. PAF (paroxysmal atrial fibrillation) ([facility])    PLAN:    In order of problems listed above:  The patient has progressive symptoms of fatigue and lower extremity edema.  Her exam, other than leg edema, is fairly benign.  She reports NYHA functional class II-III symptoms at present.  She is on an excellent medical regimen as outlined above.  Now with 6 months of good medical therapy, I have recommended a repeat echo study.  We will also check a metabolic panel, CBC, and BNP.  Further recommendations based on the results of her echocardiogram.  If she continues to have severe LV dysfunction, I will plan to proceed with a right and left heart catheterization with graft angiography.  Based on the left bundle branch block and global pattern of LV dysfunction, I suspect her worsening LV dysfunction is not related to progressive CAD but this will need to be ruled out if she has not improved with medical therapy.  Also might refer to the advanced heart failure clinic if there is no improvement in her LV function. Remains stable on current regimen which includes atorvastatin 40 mg daily, high-dose metoprolol succinate, and chronic anticoagulation. No atrial fib was seen on her recent monitor.  She tolerates anticoagulation with warfarin and remains on a beta-blocker as above.           Medication Adjustments/Labs and Tests Ordered: Current medicines are reviewed at length with the patient today.  Concerns regarding medicines are outlined above.  Orders Placed This Encounter  Procedures   CBC   Comprehensive metabolic panel   Pro b natriuretic peptide (BNP)   EKG 12-Lead   ECHOCARDIOGRAM COMPLETE   No orders of the defined types were placed in this encounter.   Patient Instructions  Medication Instructions:  Your physician recommends that you continue on your current medications as directed. Please refer to the Current Medication list given to you today.  *If  you need a refill on your cardiac medications before your next appointment, please call your pharmacy*   Lab Work: CBC, CMET, BNP today If you have labs (blood work) drawn today and your tests are completely normal, you will receive your results only by: [facility] (if you have MyChart) OR A paper copy in the mail If you have any lab test that is abnormal or we need to change your treatment, we will call you to review the results.   Testing/Procedures: ECHO Your physician has requested that you have an echocardiogram. Echocardiography is a painless test that uses sound waves to create images of your heart. It provides your doctor with information about the size and shape of your heart and how well your heart's chambers and valves are working. This procedure takes approximately one hour. There are no restrictions for this procedure.  Follow-Up: At [facility], you and your health needs are our priority.  As part of our continuing mission to provide you with exceptional heart care, we have created designated Provider Care Teams.  These Care Teams include your primary Cardiologist (physician) and Advanced Practice Providers (APPs -  Physician Assistants and Nurse Practitioners) who all work together to provide you with the care you need, when you need it.  Your next appointment:   3 month(s)  The format for your next appointment:   In Person  Provider:   Sherren Mocha, MD       Important Information About Sugar         Signed, Sherren Mocha, MD  11/01/2021 2:50 PM    [place]

## 2021-11-02 ENCOUNTER — Ambulatory Visit: Payer: Medicare Other

## 2021-11-02 ENCOUNTER — Encounter: Payer: Self-pay | Admitting: Cardiovascular Disease

## 2021-11-02 LAB — COMPREHENSIVE METABOLIC PANEL
ALT: 9 IU/L (ref 0–32)
AST: 13 IU/L (ref 0–40)
Albumin/Globulin Ratio: 1.4 (ref 1.2–2.2)
Albumin: 4.2 g/dL (ref 3.8–4.8)
Alkaline Phosphatase: 65 IU/L (ref 44–121)
BUN/Creatinine Ratio: 12 (ref 12–28)
BUN: 10 mg/dL (ref 8–27)
Bilirubin Total: 0.6 mg/dL (ref 0.0–1.2)
CO2: 17 mmol/L — ABNORMAL LOW (ref 20–29)
Calcium: 10.2 mg/dL (ref 8.7–10.3)
Chloride: 109 mmol/L — ABNORMAL HIGH (ref 96–106)
Creatinine, Ser: 0.86 mg/dL (ref 0.57–1.00)
Globulin, Total: 3 g/dL (ref 1.5–4.5)
Glucose: 163 mg/dL — ABNORMAL HIGH (ref 70–99)
Potassium: 4.2 mmol/L (ref 3.5–5.2)
Sodium: 145 mmol/L — ABNORMAL HIGH (ref 134–144)
Total Protein: 7.2 g/dL (ref 6.0–8.5)
eGFR: 71 mL/min/{1.73_m2} (ref >59)

## 2021-11-02 LAB — CBC
Hematocrit: 38.1 % (ref 34.0–46.6)
Hemoglobin: 12.3 g/dL (ref 11.1–15.9)
MCH: 27 pg (ref 26.6–33.0)
MCHC: 32.3 g/dL (ref 31.5–35.7)
MCV: 84 fL (ref 79–97)
Platelets: 309 10*3/uL (ref 150–450)
RBC: 4.55 x10E6/uL (ref 3.77–5.28)
RDW: 16.1 % — ABNORMAL HIGH (ref 11.7–15.4)
WBC: 5.7 10*3/uL (ref 3.4–10.8)

## 2021-11-02 LAB — PRO B NATRIURETIC PEPTIDE: NT-Pro BNP: 138 pg/mL (ref 0–301)

## 2021-11-03 ENCOUNTER — Telehealth: Payer: Self-pay

## 2021-11-03 ENCOUNTER — Ambulatory Visit (HOSPITAL_COMMUNITY): Payer: Medicare Other | Attending: Cardiology

## 2021-11-03 DIAGNOSIS — I5022 Chronic systolic (congestive) heart failure: Secondary | ICD-10-CM | POA: Diagnosis not present

## 2021-11-03 LAB — ECHOCARDIOGRAM COMPLETE
Area-P 1/2: 4.21 cm2
S' Lateral: 2.6 cm

## 2021-11-03 NOTE — Telephone Encounter (Signed)
LVM informing pt that ozempic patient assistance is ready for pick up.

## 2021-11-04 ENCOUNTER — Telehealth: Payer: Medicare Other

## 2021-11-05 NOTE — Telephone Encounter (Signed)
NOTE NOT NEEDED ?

## 2021-11-08 ENCOUNTER — Other Ambulatory Visit: Payer: Self-pay | Admitting: Internal Medicine

## 2021-11-08 DIAGNOSIS — I5042 Chronic combined systolic (congestive) and diastolic (congestive) heart failure: Secondary | ICD-10-CM

## 2021-11-09 ENCOUNTER — Other Ambulatory Visit: Payer: Self-pay | Admitting: Internal Medicine

## 2021-11-09 MED ORDER — PHENYLEPHRINE 80 MCG/ML (10ML) SYRINGE FOR IV PUSH (FOR BLOOD PRESSURE SUPPORT)
PREFILLED_SYRINGE | INTRAVENOUS | Status: AC
  Filled 2021-11-09: qty 10

## 2021-11-09 MED ORDER — EPHEDRINE 5 MG/ML INJ
INTRAVENOUS | Status: AC
  Filled 2021-11-09: qty 5

## 2021-11-09 NOTE — Telephone Encounter (Signed)
Patients husband stopped by and picked up

## 2021-11-10 ENCOUNTER — Other Ambulatory Visit: Payer: Self-pay | Admitting: Cardiovascular Disease

## 2021-11-10 DIAGNOSIS — Z79891 Long term (current) use of opiate analgesic: Secondary | ICD-10-CM | POA: Diagnosis not present

## 2021-11-10 DIAGNOSIS — G894 Chronic pain syndrome: Secondary | ICD-10-CM | POA: Diagnosis not present

## 2021-11-10 DIAGNOSIS — M47816 Spondylosis without myelopathy or radiculopathy, lumbar region: Secondary | ICD-10-CM | POA: Diagnosis not present

## 2021-11-10 DIAGNOSIS — M150 Primary generalized (osteo)arthritis: Secondary | ICD-10-CM | POA: Diagnosis not present

## 2021-11-10 NOTE — Progress Notes (Signed)
Updating MAR to reflect pt reported not taking certain meds on 7/71/16, but not removed from list.

## 2021-11-11 ENCOUNTER — Ambulatory Visit (INDEPENDENT_AMBULATORY_CARE_PROVIDER_SITE_OTHER): Payer: Medicare Other | Admitting: Family Medicine

## 2021-11-11 ENCOUNTER — Encounter: Payer: Self-pay | Admitting: Family Medicine

## 2021-11-11 ENCOUNTER — Telehealth: Payer: Self-pay

## 2021-11-11 VITALS — BP 116/70 | HR 73 | Temp 97.8°F | Ht 61.0 in

## 2021-11-11 DIAGNOSIS — L89152 Pressure ulcer of sacral region, stage 2: Secondary | ICD-10-CM | POA: Diagnosis not present

## 2021-11-11 MED ORDER — ZINC OXIDE 40 % EX OINT: 1.0000 | TOPICAL_OINTMENT | CUTANEOUS | 0 refills | Status: DC | PRN
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
## 2021-11-11 NOTE — Patient Instructions (Addendum)
You may use the Desitin ointment I prescribed to [facility] or get some sort of "Butt Paste" over the counter.   I also recommend getting a pressure cushion and avoid sitting long periods.   You will hear from the wound care center to schedule a visit.   If you develop fever, chills, nausea or vomiting or see any signs of pus or infection then you should be seen right away.    Pressure Injury  A pressure injury is damage to the skin and underlying tissue that results from pressure being applied to an area of the body. It often affects people who must spend a long time in a bed or chair because of a medical condition. Pressure injuries usually occur: Over bony parts of the body, such as the tailbone, shoulders, elbows, hips, heels, spine, ankles, and back of the head. Under medical devices that make contact with the body, such as respiratory equipment, stockings, tubes, and splints. Pressure injuries start as reddened areas on the skin and can lead to pain and an open wound. What are the causes? This condition is caused by frequent or constant pressure to an area of the body. Decreased blood flow to the skin can eventually cause the skin tissue to die and break down, causing a wound. What increases the risk? You are more likely to develop this condition if you: Are in the hospital or an extended care facility. Are bedridden or in a wheelchair. Have an injury or disease that keeps you from: Moving normally. Feeling pain or pressure. Have a condition that: Makes you sleepy or less alert. Causes poor blood flow. Need to wear a medical device. Have poor control of your bladder or bowel functions (incontinence). Have poor nutrition (malnutrition). If you are at risk for pressure injuries, your health care provider may recommend certain types of mattresses, mattress covers, pillows, cushions, or boots to help prevent them. These may include products filled with air, foam, gel, or sand. What  are the signs or symptoms? Symptoms of this condition depend on the severity of the injury. Symptoms may include: Red or dark areas of the skin. Pain, warmth, or a change of skin texture. Blisters. An open wound. How is this diagnosed? This condition is diagnosed with a medical history and physical exam. You may also have tests, such as: Blood tests. Imaging tests. Blood flow tests. Your pressure injury will be staged based on its severity. Staging is based on: The depth of the tissue injury, including whether there is exposure of muscle, bone, or tendon. The cause of the pressure injury. How is this treated? This condition may be treated by: Relieving or redistributing pressure on your skin. This includes: Frequently changing your position. Avoiding positions that caused the wound or that can make the wound worse. Using specific bed mattresses, chair cushions, or protective boots. Moving medical devices from an area of pressure, or placing padding between the skin and the device. Using foams, creams, or powders to prevent rubbing (friction) on the skin. Keeping your skin clean and dry. This may include using a skin cleanser or skin barrier as told by your health care provider. Cleaning your injury and removing any dead tissue from the wound (debridement). Placing a bandage (dressing) over your injury. Using medicines for pain or to prevent or treat infection. Surgery may be needed if other treatments are not working or if your injury is very deep. Follow these instructions at home: Wound care Follow instructions from your health care provider  about how to take care of your wound. Make sure you: Wash your hands with soap and water before and after you change your bandage (dressing). If soap and water are not available, use hand sanitizer. Change your dressing as told by your health care provider.  Check your wound every day for signs of infection. Have a caregiver do this for you if  you are not able. Check for: Redness, swelling, or increased pain. More fluid or blood. Warmth. Pus or a bad smell. Skin care Keep your skin clean and dry. Gently pat your skin dry. Do not rub or massage your skin. You or a caregiver should check your skin every day for any changes in color or any new blisters or sores (ulcers). Medicines Take over-the-counter and prescription medicines only as told by your health care provider. If you were prescribed an antibiotic medicine, take or apply it as told by your health care provider. Do not stop using the antibiotic even if your condition improves. Reducing and redistributing pressure Do not lie or sit in one position for a long time. Move or change position every 1-2 hours, or as told by your health care provider. Use pillows or cushions to reduce pressure. Ask your health care provider to recommend cushions or pads for you. General instructions  Eat a healthy diet that includes lots of protein. Drink enough fluid to keep your urine pale yellow. Be as active as you can every day. Ask your health care provider to suggest safe exercises or activities. Do not abuse drugs or alcohol. Do not use any products that contain nicotine or tobacco, such as cigarettes, e-cigarettes, and chewing tobacco. If you need help quitting, ask your health care provider. Keep all follow-up visits as told by your health care provider. This is important. Contact a health care provider if: You have: A fever or chills. Pain that is not helped by medicine. Any changes in skin color. New blisters or sores. Pus or a bad smell coming from your wound. Redness, swelling, or pain around your wound. More fluid or blood coming from your wound. Your wound does not improve after 1-2 weeks of treatment. Summary A pressure injury is damage to the skin and underlying tissue that results from pressure being applied to an area of the body. Do not lie or sit in one position for a  long time. Your health care provider may advise you to move or change position every 1-2 hours. Follow instructions from your health care provider about how to take care of your wound. Keep all follow-up visits as told by your health care provider. This is important. This information is not intended to replace advice given to you by your health care provider. Make sure you discuss any questions you have with your health care provider. Document Revised: 12/17/2020 Document Reviewed: 12/17/2020 Elsevier Patient Education  [place].

## 2021-11-11 NOTE — Telephone Encounter (Signed)
Called and spoke with patient to let her know that PCP manages coumadin/INR. Sent results to PCP office and asked for their help with lovenox bridge. Additionally, called and left message on their coumadin clinic line w/ Randall An, RN

## 2021-11-11 NOTE — Telephone Encounter (Signed)
Will work on lovenox bridge schedule per cardiology request for heat cath scheduled for 9/18. Pt has coumadin clinic apt on 9/12 where she will receive instructions for lovenox and warfarin.

## 2021-11-11 NOTE — Telephone Encounter (Signed)
-----   Message from Leonidas Romberg, RN sent at 11/10/2021 10:27 AM EDT ----- Regarding: RE: Lovenox bridge Good Morning Judson Roch!  It looks like this pt's Warfarin/INR is managed by her PCP (Dr Ronnald Ramp) and she sees Randall An, RN for dosing instructions. They should be able to help pt with Lovenox bridging  Lisette Abu, RN   ----- Message ----- From: Ma Hillock Sent: 11/10/2021  10:18 AM EDT To: [facility] Subject: Lovenox bridge                                 Pt's cath is scheduled for Monday, September 18 at 10:30 w/ Dr Burt Knack. Can you guys assist on her lovenox bridging? ----- Message ----- From: Sherren Mocha, MD Sent: 11/09/2021   3:03 PM EDT To: Donnalee Curry K  BNP/labs normal. However, LVEF remains severely reduced. Pt on an excellent medical regimen. She is symptomatic as per recent OV. Recommend R/L heart catheterization for further assessment of progressive CAD and evaluation of cardiac hemodynamics.   Spoke with the patient at length over the phone. She is agreeable to cath. We had detailed discussion on the risks, benefits, and alternatives. She been through multiple cardiac caths in the past, last in 2014 via left radial approach. I have reviewed the risks, indications, and alternatives to cardiac catheterization, possible angioplasty, and stenting with the patient. Risks include but are not limited to bleeding, infection, vascular injury, stroke, myocardial infection, arrhythmia, kidney injury, radiation-related injury in the case of prolonged fluoroscopy use, emergency cardiac surgery, and death. The patient understands the risks of serious complication is 1-2 in 8502 with diagnostic cardiac cath and 1-2% or less with angioplasty/stenting.    Plan left radial/brachial approach. Recommend hold warfarin 4 days before procedure with lovenox bridge if [facility] able to arrange. thanks

## 2021-11-12 NOTE — Telephone Encounter (Signed)
Contacted pt and advised of need for lovenox bridge for heart cath. Pt agrees and will receive instructions on 9/12 at coumadin clinic apt.

## 2021-11-15 NOTE — Telephone Encounter (Addendum)
Procedure: heart cath scheduled for 9/18 Wt: 77.7 kg CrCL:  69.05 mL/min  Lovenox, 120 mg, QD, need 10 syringes  9/13: Take last dose of coumadin 9/14: No coumadin, no Lovenox 9/15: No coumadin, Lovenox once in AM 9/16: No coumadin, Lovenox once in AM 9/17: No coumadin, Lovenox in AM (before 7 AM)  9/18: SURGERY; NO COUMADIN, NO LOVENOX  9/19: Take 1 1/2 tablets (4.5 mg), Lovenox once in AM 9/20: Take 1 1/2 tablets (4.5 mg), Lovenox once in AM 9/21: Take 1 1/2 tablets (4.5 mg), Lovenox once in AM 9/22: Take 1 1/2 tablets (4.5 mg), Lovenox once in AM 9/23: Take 1 tablet (3 mg), Lovenox once in AM 9/24: Take 1 tablet (3 mg), Lovenox once in AM 9/25: Take 1 1/2 tablets (4.5 mg), Lovenox once in AM  9/26: Recheck INR, no coumadin, no lovenox until after INR check.

## 2021-11-16 ENCOUNTER — Encounter: Payer: Self-pay | Admitting: Internal Medicine

## 2021-11-16 ENCOUNTER — Ambulatory Visit (INDEPENDENT_AMBULATORY_CARE_PROVIDER_SITE_OTHER): Payer: Medicare Other

## 2021-11-16 ENCOUNTER — Ambulatory Visit (INDEPENDENT_AMBULATORY_CARE_PROVIDER_SITE_OTHER): Payer: Medicare Other | Admitting: Internal Medicine

## 2021-11-16 VITALS — BP 108/68 | HR 72 | Temp 97.7°F | Ht 61.0 in | Wt 171.4 lb

## 2021-11-16 DIAGNOSIS — N1831 Chronic kidney disease, stage 3a: Secondary | ICD-10-CM | POA: Diagnosis not present

## 2021-11-16 DIAGNOSIS — R350 Frequency of micturition: Secondary | ICD-10-CM | POA: Diagnosis not present

## 2021-11-16 DIAGNOSIS — K5904 Chronic idiopathic constipation: Secondary | ICD-10-CM | POA: Diagnosis not present

## 2021-11-16 DIAGNOSIS — Z794 Long term (current) use of insulin: Secondary | ICD-10-CM | POA: Diagnosis not present

## 2021-11-16 DIAGNOSIS — E1169 Type 2 diabetes mellitus with other specified complication: Secondary | ICD-10-CM | POA: Diagnosis not present

## 2021-11-16 DIAGNOSIS — E785 Hyperlipidemia, unspecified: Secondary | ICD-10-CM

## 2021-11-16 DIAGNOSIS — Z7901 Long term (current) use of anticoagulants: Secondary | ICD-10-CM

## 2021-11-16 DIAGNOSIS — L89152 Pressure ulcer of sacral region, stage 2: Secondary | ICD-10-CM

## 2021-11-16 DIAGNOSIS — Z23 Encounter for immunization: Secondary | ICD-10-CM

## 2021-11-16 DIAGNOSIS — Z0001 Encounter for general adult medical examination with abnormal findings: Secondary | ICD-10-CM | POA: Diagnosis not present

## 2021-11-16 DIAGNOSIS — E1151 Type 2 diabetes mellitus with diabetic peripheral angiopathy without gangrene: Secondary | ICD-10-CM

## 2021-11-16 DIAGNOSIS — E119 Type 2 diabetes mellitus without complications: Secondary | ICD-10-CM

## 2021-11-16 DIAGNOSIS — Z1231 Encounter for screening mammogram for malignant neoplasm of breast: Secondary | ICD-10-CM

## 2021-11-16 LAB — LIPID PANEL
Cholesterol: 105 mg/dL (ref 0–200)
HDL: 41 mg/dL (ref >39.00)
LDL Cholesterol: 46 mg/dL (ref 0–99)
NonHDL: 64.02
Total CHOL/HDL Ratio: 3
Triglycerides: 90 mg/dL (ref 0.0–149.0)
VLDL: 18 mg/dL (ref 0.0–40.0)

## 2021-11-16 LAB — POCT INR: INR: 2.7 (ref 2.0–3.0)

## 2021-11-16 LAB — MICROALBUMIN / CREATININE URINE RATIO
Creatinine,U: 75.4 mg/dL
Microalb Creat Ratio: 0.9 mg/g (ref 0.0–30.0)
Microalb, Ur: <0.7 mg/dL (ref 0.0–1.9)

## 2021-11-16 MED ORDER — WARFARIN SODIUM 3 MG PO TABS: ORAL_TABLET | ORAL | 1 refills | Status: DC

## 2021-11-16 MED ORDER — ENOXAPARIN SODIUM 120 MG/0.8ML IJ SOSY: 120.0000 mg | PREFILLED_SYRINGE | INTRAMUSCULAR | 0 refills | Status: DC

## 2021-11-16 MED ORDER — TRULANCE 3 MG PO TABS: 1.0000 | ORAL_TABLET | Freq: Every day | ORAL | 1 refills | Status: DC

## 2021-11-16 NOTE — Progress Notes (Signed)
Lovenox, 120 mg, QD, need 10 syringes and warfarin refill sent to pharmacy.  Continue 1 tablet daily except take 1 1/2 tablets on Mondays until you start the dosing schedule below.    9/13: Take last dose of coumadin 9/14: No coumadin, no Lovenox 9/15: No coumadin, Lovenox once in AM 9/16: No coumadin, Lovenox once in AM 9/17: No coumadin, Lovenox in AM (before 7 AM)   9/18: SURGERY; NO COUMADIN, NO LOVENOX   9/19: Take 1 1/2 tablets (4.5 mg), Lovenox once in AM 9/20: Take 1 1/2 tablets (4.5 mg), Lovenox once in AM 9/21: Take 1 1/2 tablets (4.5 mg), Lovenox once in AM 9/22: Take 1 1/2 tablets (4.5 mg), Lovenox once in AM 9/23: Take 1 tablet (3 mg), Lovenox once in AM 9/24: Take 1 tablet (3 mg), Lovenox once in AM 9/25: Take 1 1/2 tablets (4.5 mg), Lovenox once in AM   9/26: Recheck INR, no coumadin, no lovenox until after INR check.

## 2021-11-16 NOTE — Patient Instructions (Signed)

## 2021-11-16 NOTE — Progress Notes (Signed)
Subjective:  Patient ID: Kristen [name], female    DOB: 1945-03-15  Age: 76 y.o. MRN: [id number]  CC: Annual Exam, Hypertension, Hyperlipidemia, Diabetes, and Hypothyroidism   HPI Nikisha Fleece presents for a CPX and f/up -   She complains that she has developed some skin breakdown on her sacrum.  She is not tolerating the GLP-1 agonist due to GI side effects.  She continues to complain of constipation and has not gotten much symptom relief with MiraLAX.   Outpatient Medications Prior to Visit  Medication Sig Dispense Refill   ALPRAZolam (XANAX) 1 MG tablet TAKE ONE TABLET BY MOUTH EVERY MORNING, TAKE 1/2 TABLET midday, AND TAKE 1/2 TABLET AT BEDTIME (Patient taking differently: Take 0.5-1 mg by mouth See admin instructions. TAKE 1 mg  BY MOUTH EVERY MORNING, TAKE 0.5 mg  midday, AND TAKE 1 mg AT BEDTIME) 180 tablet 0   atorvastatin (LIPITOR) 40 MG tablet Take 1 tablet (40 mg total) by mouth daily. (Patient taking differently: Take 40 mg by mouth at bedtime.) 90 tablet 1   Blood Glucose Monitoring Suppl (ONE TOUCH ULTRA 2) w/Device KIT Inject 1 Act into the skin 3 (three) times daily. Use TID 1 kit 2   diclofenac Sodium (VOLTAREN) 1 % GEL Apply 2 g topically 4 (four) times daily. (Patient taking differently: Apply 2 g topically daily as needed (Knees).) 100 g 0   furosemide (LASIX) 20 MG tablet Take 1 tablet (20 mg total) by mouth daily. 60 tablet 3   gabapentin (NEURONTIN) 300 MG capsule Take ONE capsule by MOUTH three times daily 270 capsule 1   glucose blood (COOL BLOOD GLUCOSE TEST STRIPS) test strip 1 each by Other route 3 (three) times daily. Use to test blood sugar TID. DX: E11.9 300 each 1   JARDIANCE 25 MG TABS tablet Take 1 tablet (25 mg total) by mouth daily. Via BI CARES pt assistance 90 tablet 1   Lancets (ONETOUCH ULTRASOFT) lancets Use as instructed 100 each 12   liver oil-zinc oxide (DESITIN) 40 % ointment Apply 1 Application topically as needed for irritation.  (Patient taking differently: Apply 1 Application topically daily as needed for irritation. Butt Paste) 56.7 g 0   magnesium oxide (MAG-OX) 400 (240 Mg) MG tablet Take 1 tablet (400 mg total) by mouth 2 (two) times daily. Take in the morning and evening. 180 tablet 3   meclizine (ANTIVERT) 25 MG tablet Take 1 tablet (25 mg total) by mouth 3 (three) times daily as needed for dizziness. 270 tablet 0   metFORMIN (GLUCOPHAGE-XR) 500 MG 24 hr tablet Take 3 tablets (1,500 mg total) by mouth daily with breakfast. 270 tablet 1   metoprolol succinate (TOPROL-XL) 100 MG 24 hr tablet TAKE ONE TABLET BY MOUTH EVERY MORNING and TAKE ONE TABLET BY MOUTH EVERYDAY AT BEDTIME 180 tablet 2   nitroGLYCERIN (NITROSTAT) 0.4 MG SL tablet Place 1 tablet (0.4 mg total) under the tongue every 5 (five) minutes as needed for chest pain (x 3 doses). Reported on 05/08/2015 35 tablet 2   NOVOFINE PEN NEEDLE 32G X 6 MM MISC USE with insulin AS DIRECTED 100 each 3   oxyCODONE-acetaminophen (PERCOCET) 10-325 MG tablet Take 1 tablet by mouth every 4 (four) hours as needed for pain.     polyethylene glycol (MIRALAX / GLYCOLAX) 17 g packet Take 17 g by mouth daily. 14 each 0   potassium chloride SA (KLOR-CON M) 20 MEQ tablet TAKE ONE TABLET BY MOUTH EVERY MORNING and  TAKE ONE TABLET BY MOUTH EVERYDAY AT BEDTIME 180 tablet 2   sacubitril-valsartan (ENTRESTO) 97-103 MG Take 1 tablet by mouth 2 (two) times daily. 180 tablet 3   spironolactone (ALDACTONE) 25 MG tablet TAKE ONE TABLET BY MOUTH ONCE DAILY 90 tablet 1   thiamine 100 MG tablet Take 1 tablet (100 mg total) by mouth daily. 90 tablet 1   insulin glargine, 2 Unit Dial, (TOUJEO MAX SOLOSTAR) 300 UNIT/ML Solostar Pen Inject 30 Units into the skin daily. (Patient taking differently: Inject 30 Units into the skin at bedtime.) 9 mL 1   Semaglutide, 2 MG/DOSE, (OZEMPIC, 2 MG/DOSE,) 8 MG/3ML SOPN Inject 2 mg into the skin once a week.     warfarin (COUMADIN) 3 MG tablet TAKE ONE TABLET BY  MOUTH DAILY EXCEPT ON MONDAY AND THURSDAY TAKE 1 AND 1/2 TABLETS OR AS DIRECTED by coumadin clinic (Patient taking differently: Take 3-4.5 mg by mouth See admin instructions. Take 4.5 mg on Monday all the other days take 3 mg at bedtime AS DIRECTED by coumadin clinic) 100 tablet 2   No facility-administered medications prior to visit.    ROS Review of Systems  Constitutional:  Positive for fatigue. Negative for diaphoresis.  HENT: Negative.    Eyes: Negative.   Respiratory:  Negative for cough, chest tightness, shortness of breath and wheezing.   Cardiovascular:  Negative for chest pain, palpitations and leg swelling.  Gastrointestinal:  Positive for constipation. Negative for abdominal pain, diarrhea, nausea and vomiting.  Endocrine: Positive for polyuria.  Genitourinary:  Positive for frequency. Negative for difficulty urinating, dysuria and urgency.  Musculoskeletal: Negative.   Skin: Negative.   Neurological: Negative.  Negative for dizziness and weakness.  Hematological:  Negative for adenopathy. Does not bruise/bleed easily.  Psychiatric/Behavioral: Negative.      Objective:  BP 108/68   Pulse 72   Temp 97.7 F (36.5 C) (Oral)   Ht 5\' 1"  (1.549 m)   Wt 171 lb 6 oz (77.7 kg)   SpO2 93%   BMI 32.38 kg/m   BP Readings from Last 3 Encounters:  11/16/21 108/68  11/11/21 116/70  11/01/21 106/70    Wt Readings from Last 3 Encounters:  11/16/21 171 lb 6 oz (77.7 kg)  11/01/21 171 lb 3.2 oz (77.7 kg)  09/01/21 173 lb (78.5 kg)    Physical Exam Vitals reviewed.  Constitutional:      Appearance: She is not ill-appearing.  HENT:     Nose: Nose normal.     Mouth/Throat:     Mouth: Mucous membranes are moist.  Eyes:     General: No scleral icterus.    Conjunctiva/sclera: Conjunctivae normal.  Cardiovascular:     Rate and Rhythm: Normal rate and regular rhythm.     Heart sounds: No murmur heard. Pulmonary:     Effort: Pulmonary effort is normal.     Breath sounds:  No stridor. No wheezing, rhonchi or rales.  Abdominal:     General: Abdomen is flat.     Palpations: There is no mass.     Tenderness: There is no abdominal tenderness. There is no guarding.     Hernia: No hernia is present.  Musculoskeletal:        General: Normal range of motion.     Cervical back: Neck supple.     Right lower leg: No edema.     Left lower leg: No edema.     Comments: Stage II breakdown on the sacrum.  Lymphadenopathy:  Cervical: No cervical adenopathy.  Skin:    General: Skin is warm and dry.     Findings: No rash.  Neurological:     General: No focal deficit present.     Mental Status: She is alert.  Psychiatric:        Mood and Affect: Mood normal.        Behavior: Behavior normal.     Lab Results  Component Value Date   WBC 5.7 11/01/2021   HGB 12.3 11/01/2021   HCT 38.1 11/01/2021   PLT 309 11/01/2021   GLUCOSE 163 (H) 11/01/2021   CHOL 105 11/16/2021   TRIG 90.0 11/16/2021   HDL 41.00 11/16/2021   LDLDIRECT 116.0 11/28/2008   LDLCALC 46 11/16/2021   ALT 9 11/01/2021   AST 13 11/01/2021   NA 145 (H) 11/01/2021   K 4.2 11/01/2021   CL 109 (H) 11/01/2021   CREATININE 0.86 11/01/2021   BUN 10 11/01/2021   CO2 17 (L) 11/01/2021   TSH 2.67 09/08/2021   INR 2.7 11/16/2021   HGBA1C 8.4 (H) 11/16/2021   MICROALBUR <0.7 11/16/2021    MR Brain Wo Contrast  Result Date: 08/16/2021 CLINICAL DATA:  Provided history: Weakness of extremity. Delusions of parasitosis. Ataxia, nontraumatic, stroke excluded. Additional history provided by scanning technologist: Vertigo, difficulty walking and speaking for 1 month, bilateral leg weakness. EXAM: MRI HEAD WITHOUT CONTRAST TECHNIQUE: Multiplanar, multiecho pulse sequences of the brain and surrounding structures were obtained without intravenous contrast. COMPARISON:  Brain MRI 04/24/2017 (images available, report unavailable). FINDINGS: Brain: Mild generalized cerebral atrophy. Redemonstrated small chronic  cortically based infarct with associated chronic blood products in the left superior temporal gyrus. Multifocal T2 FLAIR hyperintense signal abnormality within the cerebral white matter, nonspecific but compatible with mild chronic small vessel ischemic disease. Partially empty sella turcica. There is no acute infarct. No evidence of an intracranial mass. No extra-axial fluid collection. No midline shift. Vascular: Maintained flow voids within the proximal large arterial vessels. Skull and upper cervical spine: No focal suspicious marrow lesion. Sinuses/Orbits: No mass or acute finding within the imaged orbits. No significant paranasal sinus disease. IMPRESSION: No evidence of acute intracranial abnormality. Redemonstrated small chronic cortically-based infarct with associated chronic blood products in the left superior temporal gyrus. Mild chronic small vessel ischemic changes within the cerebral white matter, progressed from the prior brain MRI of 04/24/2017. Mild generalized cerebral atrophy. Electronically Signed   By: Kellie Simmering D.O.   On: 08/16/2021 07:45    Assessment & Plan:   Dayelin was seen today for annual exam, hypertension, hyperlipidemia, diabetes and hypothyroidism.  Diagnoses and all orders for this visit:  Stage 3a chronic kidney disease (HCC)-her renal function is stable. -     Urinalysis, Routine w reflex microscopic; Future -     Microalbumin / creatinine urine ratio; Future -     Microalbumin / creatinine urine ratio -     Urinalysis, Routine w reflex microscopic  Type 2 diabetes mellitus without complication, with long-term current use of insulin (HCC)-her A1c is up to 8.4%.  Will increase the dose of the basal insulin. -     Hemoglobin A1c; Future -     Microalbumin / creatinine urine ratio; Future -     Microalbumin / creatinine urine ratio -     Hemoglobin A1c  Flu vaccine need -     Flu Vaccine QUAD High Dose(Fluad)  Pressure injury of sacral region, stage 2  (HCC) -  Ambulatory referral to [place]  Encounter for general adult medical examination with abnormal findings-exam completed, labs reviewed, vaccines reviewed and updated, cancer screenings addressed, patient education was given.  DM (diabetes mellitus), type 2 with peripheral vascular complications (HCC) -     Microalbumin / creatinine urine ratio; Future -     Microalbumin / creatinine urine ratio  Chronic idiopathic constipation -     Plecanatide (TRULANCE) 3 MG TABS; Take 1 tablet by mouth daily.  Urine frequency-urine culture is negative. -     CULTURE, URINE COMPREHENSIVE; Future -     CULTURE, URINE COMPREHENSIVE  Hyperlipidemia associated with type 2 diabetes mellitus ([place])- LDL goal achieved. Doing well on the statin  -     Lipid panel; Future -     Lipid panel  Insulin-requiring or dependent type II diabetes mellitus (HCC) -     insulin glargine, 2 Unit Dial, (TOUJEO MAX SOLOSTAR) 300 UNIT/ML Solostar Pen; Inject 40 Units into the skin daily. -     Continuous Blood Gluc Receiver ([place]) [place]; 1 Act by Does not apply route daily. -     Continuous Blood Gluc Sensor (DEXCOM G7 SENSOR) MISC; 1 Act by Does not apply route daily. -     HM Diabetes Foot Exam  Visit for screening mammogram -     MM DIGITAL SCREENING BILATERAL; Future  Other orders -     Pneumococcal polysaccharide vaccine 23-valent greater than or equal to 2yo subcutaneous/IM   I have discontinued [name] Dick "Pat"'s Ozempic (2 MG/DOSE). I have also changed her [employer]. Additionally, I am having her start on Trulance, Dexcom G7 Receiver, and Dexcom G7 Sensor. Lastly, I am having her maintain her onetouch ultrasoft, Novofine Pen Needle, magnesium oxide, ONE TOUCH ULTRA 2, Cool Blood Glucose Test Strips, meclizine, atorvastatin, thiamine, oxyCODONE-acetaminophen, polyethylene glycol, diclofenac Sodium, furosemide, gabapentin, potassium chloride SA, Entresto, nitroGLYCERIN,  metoprolol succinate, Jardiance, metFORMIN, ALPRAZolam, spironolactone, and liver oil-zinc oxide.  Meds ordered this encounter  Medications   Plecanatide (TRULANCE) 3 MG TABS    Sig: Take 1 tablet by mouth daily.    Dispense:  90 tablet    Refill:  1   insulin glargine, 2 Unit Dial, (TOUJEO MAX SOLOSTAR) 300 UNIT/ML Solostar Pen    Sig: Inject 40 Units into the skin daily.    Dispense:  12 mL    Refill:  1   Continuous Blood Gluc Receiver (DEXCOM G7 RECEIVER) DEVI    Sig: 1 Act by Does not apply route daily.    Dispense:  9 each    Refill:  1   Continuous Blood Gluc Sensor (DEXCOM G7 SENSOR) MISC    Sig: 1 Act by Does not apply route daily.    Dispense:  9 each    Refill:  1     Follow-up: Return in about 6 months (around 05/17/2022).  Scarlette Calico, MD

## 2021-11-16 NOTE — Patient Instructions (Addendum)
Pre visit review using our clinic review tool, if applicable. No additional management support is needed unless otherwise documented below in the visit note.    Continue 1 tablet daily except take 1 1/2 tablets on Mondays until you start the dosing schedule below.   9/13: Take last dose of coumadin 9/14: No coumadin, no Lovenox 9/15: No coumadin, Lovenox once in AM 9/16: No coumadin, Lovenox once in AM 9/17: No coumadin, Lovenox in AM (before 7 AM)   9/18: SURGERY; NO COUMADIN, NO LOVENOX   9/19: Take 1 1/2 tablets (4.5 mg), Lovenox once in AM 9/20: Take 1 1/2 tablets (4.5 mg), Lovenox once in AM 9/21: Take 1 1/2 tablets (4.5 mg), Lovenox once in AM 9/22: Take 1 1/2 tablets (4.5 mg), Lovenox once in AM 9/23: Take 1 tablet (3 mg), Lovenox once in AM 9/24: Take 1 tablet (3 mg), Lovenox once in AM 9/25: Take 1 1/2 tablets (4.5 mg), Lovenox once in AM   9/26: Recheck INR, no coumadin, no lovenox until after INR check.

## 2021-11-17 LAB — URINALYSIS, ROUTINE W REFLEX MICROSCOPIC
Bilirubin Urine: NEGATIVE
Hgb urine dipstick: NEGATIVE
Ketones, ur: NEGATIVE
Leukocytes,Ua: NEGATIVE
Nitrite: NEGATIVE
Specific Gravity, Urine: 1.015 (ref 1.000–1.030)
Total Protein, Urine: NEGATIVE
Urine Glucose: >=1000 — AB
Urobilinogen, UA: 1 (ref 0.0–1.0)
pH: 5.5 (ref 5.0–8.0)

## 2021-11-17 LAB — HEMOGLOBIN A1C: Hgb A1c MFr Bld: 8.4 % — ABNORMAL HIGH (ref 4.6–6.5)

## 2021-11-18 ENCOUNTER — Telehealth: Payer: Self-pay | Admitting: *Deleted

## 2021-11-18 LAB — CULTURE, URINE COMPREHENSIVE

## 2021-11-18 NOTE — Telephone Encounter (Signed)
Cardiac Catheterization scheduled at [facility] for: Monday November 22, 2021 10:30 AM Arrival time and place: [place] Entrance A at: 8:30 AM  Nothing to eat after midnight prior to procedure, clear liquids until 5 AM day of procedure.  Medication instructions: -Hold:  Coumadin/Lovenox bridge-patient has instructions-see  11/16/21 Anti-Coag note  Lasix/Spironolactone/KCl-AM of procedure  Jardiance-AM of procedure  Insulin-1/2 usual dose HS prior to procedure  Metformin-day of procedure and 48 hours post procedure  Ozempic-weekly on Wednesdays-last dose 11/17/21. -Except hold medications usual morning medications can be taken with sips of water including aspirin 81 mg.  Confirmed patient has responsible adult to drive home post procedure and be with patient first 24 hours after arriving home.  Patient reports no new symptoms concerning for COVID-19 in the past 10 days.  Reviewed procedure instructions with patient.

## 2021-11-21 ENCOUNTER — Encounter: Payer: Self-pay | Admitting: Internal Medicine

## 2021-11-21 DIAGNOSIS — E119 Type 2 diabetes mellitus without complications: Secondary | ICD-10-CM | POA: Insufficient documentation

## 2021-11-21 DIAGNOSIS — E1169 Type 2 diabetes mellitus with other specified complication: Secondary | ICD-10-CM | POA: Insufficient documentation

## 2021-11-21 MED ORDER — DEXCOM G7 SENSOR MISC: 1.0000 | Freq: Every day | 1 refills | Status: DC

## 2021-11-21 MED ORDER — TOUJEO MAX SOLOSTAR 300 UNIT/ML ~~LOC~~ SOPN: 40.0000 [IU] | PEN_INJECTOR | Freq: Every day | SUBCUTANEOUS | 1 refills | Status: DC

## 2021-11-21 MED ORDER — DEXCOM G7 RECEIVER DEVI: 1.0000 | Freq: Every day | 1 refills | Status: DC

## 2021-11-22 ENCOUNTER — Ambulatory Visit (HOSPITAL_COMMUNITY)
Admission: RE | Disposition: A | Discharge status: Home / Self Care | Payer: Medicare Other | Source: Home / Self Care | Attending: Cardiovascular Disease

## 2021-11-22 ENCOUNTER — Other Ambulatory Visit: Payer: Self-pay

## 2021-11-22 ENCOUNTER — Ambulatory Visit (HOSPITAL_COMMUNITY)
Admission: RE | Admit: 2021-11-22 | Discharge: 2021-11-22 | Disposition: A | Discharge status: Home / Self Care | Payer: Medicare Other | Attending: Cardiovascular Disease | Admitting: Cardiovascular Disease

## 2021-11-22 DIAGNOSIS — Z8673 Personal history of transient ischemic attack (TIA), and cerebral infarction without residual deficits: Secondary | ICD-10-CM | POA: Diagnosis not present

## 2021-11-22 DIAGNOSIS — R5383 Other fatigue: Secondary | ICD-10-CM | POA: Insufficient documentation

## 2021-11-22 DIAGNOSIS — I5042 Chronic combined systolic (congestive) and diastolic (congestive) heart failure: Secondary | ICD-10-CM

## 2021-11-22 DIAGNOSIS — I480 Paroxysmal atrial fibrillation: Secondary | ICD-10-CM | POA: Diagnosis not present

## 2021-11-22 DIAGNOSIS — I2510 Atherosclerotic heart disease of native coronary artery without angina pectoris: Secondary | ICD-10-CM | POA: Insufficient documentation

## 2021-11-22 DIAGNOSIS — I110 Hypertensive heart disease with heart failure: Secondary | ICD-10-CM | POA: Insufficient documentation

## 2021-11-22 DIAGNOSIS — I5022 Chronic systolic (congestive) heart failure: Secondary | ICD-10-CM | POA: Insufficient documentation

## 2021-11-22 DIAGNOSIS — Z79899 Other long term (current) drug therapy: Secondary | ICD-10-CM | POA: Diagnosis not present

## 2021-11-22 DIAGNOSIS — I447 Left bundle-branch block, unspecified: Secondary | ICD-10-CM | POA: Insufficient documentation

## 2021-11-22 DIAGNOSIS — Z87891 Personal history of nicotine dependence: Secondary | ICD-10-CM | POA: Insufficient documentation

## 2021-11-22 DIAGNOSIS — Z951 Presence of aortocoronary bypass graft: Secondary | ICD-10-CM | POA: Diagnosis not present

## 2021-11-22 DIAGNOSIS — Z7901 Long term (current) use of anticoagulants: Secondary | ICD-10-CM | POA: Diagnosis not present

## 2021-11-22 DIAGNOSIS — E119 Type 2 diabetes mellitus without complications: Secondary | ICD-10-CM | POA: Diagnosis not present

## 2021-11-22 DIAGNOSIS — I25119 Atherosclerotic heart disease of native coronary artery with unspecified angina pectoris: Secondary | ICD-10-CM

## 2021-11-22 HISTORY — PX: RIGHT/LEFT HEART CATH AND CORONARY/GRAFT ANGIOGRAPHY: CATH118267

## 2021-11-22 LAB — POCT I-STAT EG7
Acid-base deficit: 4 mmol/L — ABNORMAL HIGH (ref 0.0–2.0)
Bicarbonate: 21.2 mmol/L (ref 20.0–28.0)
Calcium, Ion: 1.23 mmol/L (ref 1.15–1.40)
HCT: 36 % (ref 36.0–46.0)
Hemoglobin: 12.2 g/dL (ref 12.0–15.0)
O2 Saturation: 75 %
Potassium: 3.6 mmol/L (ref 3.5–5.1)
Sodium: 142 mmol/L (ref 135–145)
TCO2: 22 mmol/L (ref 22–32)
pCO2, Ven: 37.3 mmHg — ABNORMAL LOW (ref 44–60)
pH, Ven: 7.363 (ref 7.25–7.43)
pO2, Ven: 41 mmHg (ref 32–45)

## 2021-11-22 LAB — POCT I-STAT 7, (LYTES, BLD GAS, ICA,H+H)
Acid-base deficit: 4 mmol/L — ABNORMAL HIGH (ref 0.0–2.0)
Bicarbonate: 20.1 mmol/L (ref 20.0–28.0)
Calcium, Ion: 1.25 mmol/L (ref 1.15–1.40)
HCT: 36 % (ref 36.0–46.0)
Hemoglobin: 12.2 g/dL (ref 12.0–15.0)
O2 Saturation: 94 %
Potassium: 3.6 mmol/L (ref 3.5–5.1)
Sodium: 142 mmol/L (ref 135–145)
TCO2: 21 mmol/L — ABNORMAL LOW (ref 22–32)
pCO2 arterial: 34.5 mmHg (ref 32–48)
pH, Arterial: 7.374 (ref 7.35–7.45)
pO2, Arterial: 70 mmHg — ABNORMAL LOW (ref 83–108)

## 2021-11-22 LAB — GLUCOSE, CAPILLARY: Glucose-Capillary: 140 mg/dL — ABNORMAL HIGH (ref 70–99)

## 2021-11-22 LAB — PROTIME-INR
INR: 1.1 (ref 0.8–1.2)
Prothrombin Time: 14.5 seconds (ref 11.4–15.2)

## 2021-11-22 SURGERY — RIGHT/LEFT HEART CATH AND CORONARY/GRAFT ANGIOGRAPHY
Anesthesia: LOCAL

## 2021-11-22 MED ORDER — LABETALOL HCL 5 MG/ML IV SOLN: 10.0000 mg | INTRAVENOUS | Status: DC | PRN

## 2021-11-22 MED ORDER — ACETAMINOPHEN 325 MG PO TABS
650.0000 mg | ORAL_TABLET | ORAL | Status: DC | PRN
  Administered 2021-11-22: 650 mg via ORAL
  Filled 2021-11-22: qty 2

## 2021-11-22 MED ORDER — ONDANSETRON HCL 4 MG/2ML IJ SOLN: 4.0000 mg | Freq: Four times a day (QID) | INTRAMUSCULAR | Status: DC | PRN

## 2021-11-22 MED ORDER — HEPARIN SODIUM (PORCINE) 1000 UNIT/ML IJ SOLN
INTRAMUSCULAR | Status: AC
  Filled 2021-11-22: qty 10

## 2021-11-22 MED ORDER — MIDAZOLAM HCL 2 MG/2ML IJ SOLN
INTRAMUSCULAR | Status: AC
  Filled 2021-11-22: qty 2

## 2021-11-22 MED ORDER — HEPARIN (PORCINE) IN NACL 1000-0.9 UT/500ML-% IV SOLN
INTRAVENOUS | Status: AC
  Filled 2021-11-22: qty 1000

## 2021-11-22 MED ORDER — SODIUM CHLORIDE 0.9% FLUSH: 3.0000 mL | Freq: Two times a day (BID) | INTRAVENOUS | Status: DC

## 2021-11-22 MED ORDER — LIDOCAINE HCL (PF) 1 % IJ SOLN
INTRAMUSCULAR | Status: DC | PRN
  Administered 2021-11-22 (×2): 2 mL
  Administered 2021-11-22: 10 mL

## 2021-11-22 MED ORDER — ASPIRIN 81 MG PO CHEW: 81.0000 mg | CHEWABLE_TABLET | ORAL | Status: DC

## 2021-11-22 MED ORDER — HEPARIN (PORCINE) IN NACL 1000-0.9 UT/500ML-% IV SOLN
INTRAVENOUS | Status: DC | PRN
  Administered 2021-11-22 (×2): 500 mL

## 2021-11-22 MED ORDER — FENTANYL CITRATE (PF) 100 MCG/2ML IJ SOLN
INTRAMUSCULAR | Status: DC | PRN
  Administered 2021-11-22 (×2): 25 ug via INTRAVENOUS

## 2021-11-22 MED ORDER — SODIUM CHLORIDE 0.9 % IV SOLN: INTRAVENOUS | Status: DC

## 2021-11-22 MED ORDER — FENTANYL CITRATE (PF) 100 MCG/2ML IJ SOLN
INTRAMUSCULAR | Status: AC
  Filled 2021-11-22: qty 2

## 2021-11-22 MED ORDER — SODIUM CHLORIDE 0.9 % IV SOLN: 250.0000 mL | INTRAVENOUS | Status: DC | PRN

## 2021-11-22 MED ORDER — LIDOCAINE HCL (PF) 1 % IJ SOLN
INTRAMUSCULAR | Status: AC
  Filled 2021-11-22: qty 30

## 2021-11-22 MED ORDER — MIDAZOLAM HCL 2 MG/2ML IJ SOLN
INTRAMUSCULAR | Status: DC | PRN
  Administered 2021-11-22 (×2): 1 mg via INTRAVENOUS

## 2021-11-22 MED ORDER — HYDRALAZINE HCL 20 MG/ML IJ SOLN: 10.0000 mg | INTRAMUSCULAR | Status: DC | PRN

## 2021-11-22 MED ORDER — IOHEXOL 350 MG/ML SOLN
INTRAVENOUS | Status: DC | PRN
  Administered 2021-11-22: 35 mL

## 2021-11-22 MED ORDER — SODIUM CHLORIDE 0.9% FLUSH: 3.0000 mL | INTRAVENOUS | Status: DC | PRN

## 2021-11-22 MED ORDER — SODIUM CHLORIDE 0.9 % WEIGHT BASED INFUSION: 1.0000 mL/kg/h | INTRAVENOUS | Status: DC

## 2021-11-22 MED ORDER — VERAPAMIL HCL 2.5 MG/ML IV SOLN
INTRAVENOUS | Status: AC
  Filled 2021-11-22: qty 2

## 2021-11-22 SURGICAL SUPPLY — 16 items
CATH 5FR JL3.5 JR4 ANG PIG MP (CATHETERS) IMPLANT
CATH BALLN WEDGE 5F 110CM (CATHETERS) IMPLANT
CLOSURE PERCLOSE PROSTYLE (VASCULAR PRODUCTS) IMPLANT
GLIDESHEATH SLEND SS 6F .021 (SHEATH) IMPLANT
GUIDEWIRE INQWIRE 1.5J.035X260 (WIRE) IMPLANT
INQWIRE 1.5J .035X260CM (WIRE) ×1
KIT HEART LEFT (KITS) ×2 IMPLANT
KIT MICROPUNCTURE NIT STIFF (SHEATH) IMPLANT
PACK CARDIAC CATHETERIZATION (CUSTOM PROCEDURE TRAY) ×2 IMPLANT
SHEATH GLIDE SLENDER 4/5FR (SHEATH) IMPLANT
SHEATH PINNACLE 5F 10CM (SHEATH) IMPLANT
SHEATH PROBE COVER 6X72 (BAG) IMPLANT
SYR MEDRAD MARK 7 150ML (SYRINGE) ×2 IMPLANT
TRANSDUCER W/STOPCOCK (MISCELLANEOUS) ×2 IMPLANT
TUBING CIL FLEX 10 FLL-RA (TUBING) ×2 IMPLANT
WIRE EMERALD 3MM-J .035X150CM (WIRE) IMPLANT

## 2021-11-22 NOTE — Interval H&P Note (Signed)
History and Physical Interval Note:  11/22/2021 11:21 AM  Kristen [name]  has presented today for surgery, with the diagnosis of progressive CAD.  The various methods of treatment have been discussed with the patient and family. After consideration of risks, benefits and other options for treatment, the patient has consented to  Procedure(s): RIGHT/LEFT HEART CATH AND CORONARY/GRAFT ANGIOGRAPHY (N/A) as a surgical intervention.  The patient's history has been reviewed, patient examined, no change in status, stable for surgery.  I have reviewed the patient's chart and labs.  Questions were answered to the patient's satisfaction.     Sherren Mocha

## 2021-11-23 ENCOUNTER — Encounter (HOSPITAL_COMMUNITY): Payer: Self-pay | Admitting: Cardiovascular Disease

## 2021-11-23 ENCOUNTER — Encounter (HOSPITAL_BASED_OUTPATIENT_CLINIC_OR_DEPARTMENT_OTHER): Payer: Medicare Other | Admitting: General Surgery

## 2021-11-23 ENCOUNTER — Telehealth: Payer: Self-pay

## 2021-11-23 MED FILL — Verapamil HCl IV Soln 2.5 MG/ML: INTRAVENOUS | Qty: 2 | Status: AC

## 2021-11-23 MED FILL — Heparin Sodium (Porcine) Inj 1000 Unit/ML: INTRAMUSCULAR | Qty: 10 | Status: AC

## 2021-11-23 NOTE — Telephone Encounter (Signed)
Pt called wanting to know if she should still take warfarin in the evening as she did before her lovenox bridge she is currently on for the heart cath she had yesterday. Advised pt she should continue taking warfarin in the evening. Pt verbalized understanding.

## 2021-11-30 ENCOUNTER — Ambulatory Visit (INDEPENDENT_AMBULATORY_CARE_PROVIDER_SITE_OTHER): Payer: Medicare Other | Admitting: Internal Medicine

## 2021-11-30 ENCOUNTER — Ambulatory Visit (INDEPENDENT_AMBULATORY_CARE_PROVIDER_SITE_OTHER): Payer: Medicare Other

## 2021-11-30 ENCOUNTER — Encounter: Payer: Self-pay | Admitting: Internal Medicine

## 2021-11-30 VITALS — BP 118/76 | HR 82 | Temp 97.9°F | Ht 61.0 in | Wt 162.0 lb

## 2021-11-30 DIAGNOSIS — F411 Generalized anxiety disorder: Secondary | ICD-10-CM | POA: Diagnosis not present

## 2021-11-30 DIAGNOSIS — Z794 Long term (current) use of insulin: Secondary | ICD-10-CM

## 2021-11-30 DIAGNOSIS — Z1211 Encounter for screening for malignant neoplasm of colon: Secondary | ICD-10-CM

## 2021-11-30 DIAGNOSIS — Z7901 Long term (current) use of anticoagulants: Secondary | ICD-10-CM

## 2021-11-30 DIAGNOSIS — E119 Type 2 diabetes mellitus without complications: Secondary | ICD-10-CM | POA: Diagnosis not present

## 2021-11-30 DIAGNOSIS — Z1231 Encounter for screening mammogram for malignant neoplasm of breast: Secondary | ICD-10-CM

## 2021-11-30 DIAGNOSIS — D510 Vitamin B12 deficiency anemia due to intrinsic factor deficiency: Secondary | ICD-10-CM | POA: Diagnosis not present

## 2021-11-30 LAB — POCT INR: INR: 1.2 — AB (ref 2.0–3.0)

## 2021-11-30 MED ORDER — TIRZEPATIDE 2.5 MG/0.5ML ~~LOC~~ SOAJ: 2.5000 mg | SUBCUTANEOUS | 0 refills | Status: DC

## 2021-11-30 MED ORDER — CYANOCOBALAMIN 1000 MCG/ML IJ SOLN
1000.0000 ug | Freq: Once | INTRAMUSCULAR | Status: AC
  Administered 2021-11-30: 1000 ug via INTRAMUSCULAR

## 2021-11-30 MED ORDER — ALPRAZOLAM 1 MG PO TABS: 1.0000 mg | ORAL_TABLET | Freq: Three times a day (TID) | ORAL | 0 refills | Status: DC | PRN

## 2021-11-30 NOTE — Patient Instructions (Signed)
                                                                    [email or web address]                       

## 2021-11-30 NOTE — Progress Notes (Signed)
First visit after Lovenox bridge for cardiac cath. INR today 1.2.   Increase dose today to 1 1/2 tablets and increase dose tomorrow to 1 1/2 tablets.   Then increase dose to take 1 tablet daily except take 1 1/2 tablets on Mondays, Wednesdays, and Fridays.   Vitamin B 12 1,000 mcg/1 mL administered today per pt's request in left deltoid. Pt tolerated well.

## 2021-11-30 NOTE — Patient Instructions (Signed)
Increase dose today to 1 1/2 tablets and increase dose tomorrow to 1 1/2 tablets.   Then increase dose to take 1 tablet daily except take 1 1/2 tablets on Mondays, Wednesdays, and Fridays.

## 2021-11-30 NOTE — Progress Notes (Signed)
Subjective:  Patient ID: Kristen [name], female    DOB: 1946/02/23  Age: 76 y.o. MRN: [id number]  CC: Diabetes   HPI Kristen [name] presents for f/up -  She complains of weight loss, anxiety, and polys.  She feels anxious and wants to increase the dose of Xanax.  Outpatient Medications Prior to Visit  Medication Sig Dispense Refill   atorvastatin (LIPITOR) 40 MG tablet Take 1 tablet (40 mg total) by mouth daily. (Patient taking differently: Take 40 mg by mouth at bedtime.) 90 tablet 1   Blood Glucose Monitoring Suppl (ONE TOUCH ULTRA 2) w/Device KIT Inject 1 Act into the skin 3 (three) times daily. Use TID 1 kit 2   Continuous Blood Gluc Receiver (DEXCOM G7 RECEIVER) DEVI 1 Act by Does not apply route daily. 9 each 1   Continuous Blood Gluc Sensor (DEXCOM G7 SENSOR) MISC 1 Act by Does not apply route daily. 9 each 1   diclofenac Sodium (VOLTAREN) 1 % GEL Apply 2 g topically 4 (four) times daily. (Patient taking differently: Apply 2 g topically daily as needed (Knees).) 100 g 0   enoxaparin (LOVENOX) 120 MG/0.8ML injection Inject 0.8 mLs (120 mg total) into the skin daily. PATIENT GIVEN INSTRUCTIONS IN ANTICOAGULATION CLINIC 8 mL 0   furosemide (LASIX) 20 MG tablet Take 1 tablet (20 mg total) by mouth daily. 60 tablet 3   gabapentin (NEURONTIN) 300 MG capsule Take ONE capsule by MOUTH three times daily 270 capsule 1   glucose blood (COOL BLOOD GLUCOSE TEST STRIPS) test strip 1 each by Other route 3 (three) times daily. Use to test blood sugar TID. DX: E11.9 300 each 1   insulin glargine, 2 Unit Dial, (TOUJEO MAX SOLOSTAR) 300 UNIT/ML Solostar Pen Inject 40 Units into the skin daily. 12 mL 1   JARDIANCE 25 MG TABS tablet Take 1 tablet (25 mg total) by mouth daily. Via BI CARES pt assistance 90 tablet 1   Lancets (ONETOUCH ULTRASOFT) lancets Use as instructed 100 each 12   liver oil-zinc oxide (DESITIN) 40 % ointment Apply 1 Application topically as needed for irritation.  (Patient taking differently: Apply 1 Application topically daily as needed for irritation. Butt Paste) 56.7 g 0   magnesium oxide (MAG-OX) 400 (240 Mg) MG tablet Take 1 tablet (400 mg total) by mouth 2 (two) times daily. Take in the morning and evening. 180 tablet 3   meclizine (ANTIVERT) 25 MG tablet Take 1 tablet (25 mg total) by mouth 3 (three) times daily as needed for dizziness. 270 tablet 0   metFORMIN (GLUCOPHAGE-XR) 500 MG 24 hr tablet Take 3 tablets (1,500 mg total) by mouth daily with breakfast. 270 tablet 1   metoprolol succinate (TOPROL-XL) 100 MG 24 hr tablet TAKE ONE TABLET BY MOUTH EVERY MORNING and TAKE ONE TABLET BY MOUTH EVERYDAY AT BEDTIME 180 tablet 2   nitroGLYCERIN (NITROSTAT) 0.4 MG SL tablet Place 1 tablet (0.4 mg total) under the tongue every 5 (five) minutes as needed for chest pain (x 3 doses). Reported on 05/08/2015 35 tablet 2   NOVOFINE PEN NEEDLE 32G X 6 MM MISC USE with insulin AS DIRECTED 100 each 3   oxyCODONE-acetaminophen (PERCOCET) 10-325 MG tablet Take 1 tablet by mouth every 4 (four) hours as needed for pain.     Plecanatide (TRULANCE) 3 MG TABS Take 1 tablet by mouth daily. 90 tablet 1   polyethylene glycol (MIRALAX / GLYCOLAX) 17 g packet Take 17 g by mouth daily. 14 each  0   potassium chloride SA (KLOR-CON M) 20 MEQ tablet TAKE ONE TABLET BY MOUTH EVERY MORNING and TAKE ONE TABLET BY MOUTH EVERYDAY AT BEDTIME 180 tablet 2   sacubitril-valsartan (ENTRESTO) 97-103 MG Take 1 tablet by mouth 2 (two) times daily. 180 tablet 3   spironolactone (ALDACTONE) 25 MG tablet TAKE ONE TABLET BY MOUTH ONCE DAILY 90 tablet 1   thiamine 100 MG tablet Take 1 tablet (100 mg total) by mouth daily. 90 tablet 1   warfarin (COUMADIN) 3 MG tablet TAKE ONE TABLET BY MOUTH DAILY EXCEPT TAKE 1 1/2 TABLETS ON MONDAYS OR AS DIRECTED BY ANTICOAGULATION CLINIC. 110 tablet 1   ALPRAZolam (XANAX) 1 MG tablet TAKE ONE TABLET BY MOUTH EVERY MORNING, TAKE 1/2 TABLET midday, AND TAKE 1/2 TABLET AT  BEDTIME (Patient taking differently: Take 0.5-1 mg by mouth See admin instructions. TAKE 1 mg  BY MOUTH EVERY MORNING, TAKE 0.5 mg  midday, AND TAKE 1 mg AT BEDTIME) 180 tablet 0   No facility-administered medications prior to visit.    ROS Review of Systems  Constitutional:  Positive for fatigue and unexpected weight change. Negative for appetite change and diaphoresis.  HENT: Negative.    Eyes: Negative.   Respiratory:  Negative for cough, chest tightness, shortness of breath and wheezing.   Cardiovascular:  Negative for chest pain, palpitations and leg swelling.  Gastrointestinal:  Negative for abdominal pain, constipation, diarrhea and nausea.  Endocrine: Positive for polydipsia and polyuria. Negative for polyphagia.  Genitourinary: Negative.  Negative for difficulty urinating.  Musculoskeletal: Negative.  Negative for arthralgias and myalgias.  Skin: Negative.   Neurological:  Negative for dizziness and weakness.  Hematological:  Negative for adenopathy.  Psychiatric/Behavioral:  Negative for self-injury.     Objective:  BP 118/76 (BP Location: Left Arm, Patient Position: Sitting, Cuff Size: Large)   Pulse 82   Temp 97.9 F (36.6 C) (Oral)   Ht $R'5\' 1"'pa$  (1.549 m)   Wt 162 lb (73.5 kg)   SpO2 96%   BMI 30.61 kg/m   BP Readings from Last 3 Encounters:  11/30/21 118/76  11/22/21 (!) 151/79  11/16/21 108/68    Wt Readings from Last 3 Encounters:  11/30/21 162 lb (73.5 kg)  11/22/21 171 lb (77.6 kg)  11/16/21 171 lb 6 oz (77.7 kg)    Physical Exam Vitals reviewed.  Constitutional:      Appearance: She is not ill-appearing.  Eyes:     General: No scleral icterus.    Conjunctiva/sclera: Conjunctivae normal.  Cardiovascular:     Rate and Rhythm: Normal rate and regular rhythm.     Heart sounds: No murmur heard. Pulmonary:     Effort: Pulmonary effort is normal.     Breath sounds: No stridor. No wheezing, rhonchi or rales.  Abdominal:     General: Abdomen is  protuberant. Bowel sounds are normal. There is no distension.     Palpations: Abdomen is soft. There is no hepatomegaly, splenomegaly or mass.     Tenderness: There is no abdominal tenderness.  Musculoskeletal:        General: Normal range of motion.     Cervical back: Neck supple.     Right lower leg: No edema.     Left lower leg: No edema.  Lymphadenopathy:     Cervical: No cervical adenopathy.  Skin:    General: Skin is warm and dry.  Neurological:     General: No focal deficit present.     Mental Status:  She is alert.  Psychiatric:        Mood and Affect: Mood normal.        Behavior: Behavior normal.     Lab Results  Component Value Date   WBC 5.7 11/01/2021   HGB 12.2 11/22/2021   HGB 12.2 11/22/2021   HCT 36.0 11/22/2021   HCT 36.0 11/22/2021   PLT 309 11/01/2021   GLUCOSE 163 (H) 11/01/2021   CHOL 105 11/16/2021   TRIG 90.0 11/16/2021   HDL 41.00 11/16/2021   LDLDIRECT 116.0 11/28/2008   LDLCALC 46 11/16/2021   ALT 9 11/01/2021   AST 13 11/01/2021   NA 142 11/22/2021   NA 142 11/22/2021   K 3.6 11/22/2021   K 3.6 11/22/2021   CL 109 (H) 11/01/2021   CREATININE 0.86 11/01/2021   BUN 10 11/01/2021   CO2 17 (L) 11/01/2021   TSH 2.67 09/08/2021   INR 1.2 (A) 11/30/2021   HGBA1C 8.4 (H) 11/16/2021   MICROALBUR <0.7 11/16/2021    CARDIAC CATHETERIZATION  Result Date: 11/22/2021 1.  Patent coronary arteries with mild nonobstructive plaquing of the LAD, wide patency of the left main, circumflex, and RCA. 2.  Patent but atretic LIMA graft due to competitive flow through an LAD that has no high-grade stenosis 3.  Low normal LV and RV filling pressures, preserved cardiac output, and no evidence of pulmonary hypertension Recommend medical therapy for nonobstructive CAD and chronic systolic heart failure with LV dysfunction.  The patient's filling pressures are all in the low normal range and she appears to be very well compensated at present.    Assessment & Plan:    Kristen [name] was seen today for diabetes.  Diagnoses and all orders for this visit:  Type 2 diabetes mellitus without complication, with long-term current use of insulin ([place])- Her blood sugar is not adequately well controlled.  She did not tolerate a GLP-1 agonist so will try a GLP/GIP agonist. -     Discontinue: tirzepatide (MOUNJARO) 2.5 MG/0.5ML Pen; Inject 2.5 mg into the skin once a week. -     Ambulatory referral to Ophthalmology  GAD (generalized anxiety disorder)- Dose increased. -     ALPRAZolam (XANAX) 1 MG tablet; Take 1 tablet (1 mg total) by mouth 3 (three) times daily as needed.  Screen for colon cancer -     Ambulatory referral to Gastroenterology  Visit for screening mammogram   I have discontinued [name] Dick "Pat"'s tirzepatide. I have also changed her ALPRAZolam. Additionally, I am having her maintain her onetouch ultrasoft, Novofine Pen Needle, magnesium oxide, ONE TOUCH ULTRA 2, Cool Blood Glucose Test Strips, meclizine, atorvastatin, thiamine, oxyCODONE-acetaminophen, polyethylene glycol, diclofenac Sodium, furosemide, gabapentin, potassium chloride SA, Entresto, nitroGLYCERIN, metoprolol succinate, Jardiance, metFORMIN, spironolactone, liver oil-zinc oxide, enoxaparin, warfarin, Trulance, Touje[employer] Max [employer], [employer], and [employer].  Meds ordered this encounter  Medications   DISCONTD: tirzepatide (MOUNJARO) 2.5 MG/0.5ML Pen    Sig: Inject 2.5 mg into the skin once a week.    Dispense:  2 mL    Refill:  0   ALPRAZolam (XANAX) 1 MG tablet    Sig: Take 1 tablet (1 mg total) by mouth 3 (three) times daily as needed.    Dispense:  270 tablet    Refill:  0     Follow-up: Return in about 3 months (around 03/01/2022).  Scarlette Calico, MD

## 2021-12-02 ENCOUNTER — Encounter (HOSPITAL_BASED_OUTPATIENT_CLINIC_OR_DEPARTMENT_OTHER): Payer: Medicare Other | Attending: General Surgery | Admitting: General Surgery

## 2021-12-02 ENCOUNTER — Telehealth: Payer: Self-pay | Admitting: Cardiovascular Disease

## 2021-12-02 DIAGNOSIS — I130 Hypertensive heart and chronic kidney disease with heart failure and stage 1 through stage 4 chronic kidney disease, or unspecified chronic kidney disease: Secondary | ICD-10-CM | POA: Insufficient documentation

## 2021-12-02 DIAGNOSIS — M797 Fibromyalgia: Secondary | ICD-10-CM | POA: Insufficient documentation

## 2021-12-02 DIAGNOSIS — I4891 Unspecified atrial fibrillation: Secondary | ICD-10-CM | POA: Diagnosis not present

## 2021-12-02 DIAGNOSIS — M109 Gout, unspecified: Secondary | ICD-10-CM | POA: Insufficient documentation

## 2021-12-02 DIAGNOSIS — E11622 Type 2 diabetes mellitus with other skin ulcer: Secondary | ICD-10-CM | POA: Diagnosis not present

## 2021-12-02 DIAGNOSIS — N1831 Chronic kidney disease, stage 3a: Secondary | ICD-10-CM | POA: Insufficient documentation

## 2021-12-02 DIAGNOSIS — I25119 Atherosclerotic heart disease of native coronary artery with unspecified angina pectoris: Secondary | ICD-10-CM | POA: Diagnosis not present

## 2021-12-02 DIAGNOSIS — I5042 Chronic combined systolic (congestive) and diastolic (congestive) heart failure: Secondary | ICD-10-CM | POA: Diagnosis not present

## 2021-12-02 DIAGNOSIS — E1140 Type 2 diabetes mellitus with diabetic neuropathy, unspecified: Secondary | ICD-10-CM | POA: Insufficient documentation

## 2021-12-02 DIAGNOSIS — L89152 Pressure ulcer of sacral region, stage 2: Secondary | ICD-10-CM | POA: Diagnosis not present

## 2021-12-02 DIAGNOSIS — E1122 Type 2 diabetes mellitus with diabetic chronic kidney disease: Secondary | ICD-10-CM | POA: Insufficient documentation

## 2021-12-02 NOTE — Progress Notes (Addendum)
Kristen [name], Kristen [name] ([id number]) Visit Report for 12/02/2021 Chief Complaint Document Details Patient Name: Date of Service: [place], [place] Kristen [name] 12/02/2021 1:30 PM Medical Record Number: [id number] Patient Account Number: [id number] Date of Birth/Sex: Treating RN: 03-Aug-1945 (76 y.o. F) Primary Care Provider: Scarlette Calico Other Clinician: Referring Provider: Treating Provider/Extender: Olene Floss Weeks in Treatment: 0 Information Obtained from: Patient Chief Complaint Patient presents to the wound care center due with non-wound condition(s)--had sacral pressure ulcer, but is healed Electronic Signature(s) Signed: 12/02/2021 2:31:37 PM By: Fredirick Maudlin MD FACS Entered By: Fredirick Maudlin on 12/02/2021 14:31:37 -------------------------------------------------------------------------------- HPI Details Patient Name: Date of Service: [place], [place] Kristen [name] 12/02/2021 1:30 PM Medical Record Number: [id number] Patient Account Number: [id number] Date of Birth/Sex: Treating RN: 04/16/1945 (76 y.o. F) Primary Care Provider: Scarlette Calico Other Clinician: Referring Provider: Treating Provider/Extender: Olene Floss Weeks in Treatment: 0 History of Present Illness HPI Description: CONSULTATION ONLY 12/02/2021 This is a 76 year old woman with poorly controlled type 2 diabetes (last hemoglobin A1c was 8.4), congestive heart failure, hypertension, coronary artery disease, and stage III kidney disease. She reports that in August, she developed a wound on her sacrum. The electronic medical record indicates that it was a stage II. She says that she has been sleeping in her recliner and as a result, was likely sleeping with much of her weight in this location. A provider at her primary care doctor's office recommended that she apply Gerhard's Butt paste to the site. On examination today, the wound is completely healed. Electronic  Signature(s) Signed: 12/02/2021 2:35:07 PM By: Fredirick Maudlin MD FACS Entered By: Fredirick Maudlin on 12/02/2021 14:35:07 -------------------------------------------------------------------------------- Physical Exam Details Patient Name: Date of Service: [place], [place] Kristen [name] 12/02/2021 1:30 PM Medical Record Number: [id number] Patient Account Number: [id number] Date of Birth/Sex: Treating RN: Jan 21, 1946 (76 y.o. F) Primary Care Provider: Scarlette Calico Other Clinician: Referring Provider: Treating Provider/Extender: Hester Mates, Vickie Weeks in Treatment: 0 Constitutional . . . . No acute distress. Respiratory Normal work of breathing on room air.. Notes 11/24/2021: On physical examination, there is a small linear strip of eschar just to the left of her natal cleft. This was removed and the wound is completely healed underneath. Electronic Signature(s) Signed: 12/02/2021 2:35:54 PM By: Fredirick Maudlin MD FACS Entered By: Fredirick Maudlin on 12/02/2021 14:35:54 -------------------------------------------------------------------------------- Physician Orders Details Patient Name: Date of Service: [place], [place] Kristen [name] 12/02/2021 1:30 PM Medical Record Number: [id number] Patient Account Number: [id number] Date of Birth/Sex: Treating RN: 08-30-1945 (76 y.o. Iver Nestle, Jamie Primary Care Provider: Scarlette Calico Other Clinician: Referring Provider: Treating Provider/Extender: Rudell Cobb in Treatment: 0 Verbal / Phone Orders: No Diagnosis Coding ICD-10 Coding Code Description E11.65 Type 2 diabetes mellitus with hyperglycemia I25.119 Atherosclerotic heart disease of native coronary artery with unspecified angina pectoris I50.42 Chronic combined systolic (congestive) and diastolic (congestive) heart failure N18.31 Chronic kidney disease, stage 3a Z87.2 Personal history of diseases of the skin and subcutaneous tissue Discharge From [facility]  Services Discharge from [place]! Electronic Signature(s) Signed: 12/02/2021 2:36:11 PM By: Fredirick Maudlin MD FACS Entered By: Fredirick Maudlin on 12/02/2021 14:36:10 -------------------------------------------------------------------------------- Problem List Details Patient Name: Date of Service: [place], [place] Kristen [name] 12/02/2021 1:30 PM Medical Record Number: [id number] Patient Account Number: [id number] Date of Birth/Sex: Treating RN: 13-Jun-1945 (76 y.o. F) Primary Care Provider: Scarlette Calico Other Clinician: Referring Provider: Treating Provider/Extender: Hester Mates, Vickie Weeks in  Treatment: 0 Active Problems ICD-10 Encounter Code Description Active Date MDM Diagnosis E11.65 Type 2 diabetes mellitus with hyperglycemia 12/02/2021 No Yes I25.119 Atherosclerotic heart disease of native coronary artery with unspecified angina 12/02/2021 No Yes pectoris I50.42 Chronic combined systolic (congestive) and diastolic (congestive) heart failure 12/02/2021 No Yes N18.31 Chronic kidney disease, stage 3a 12/02/2021 No Yes Z87.2 Personal history of diseases of the skin and subcutaneous tissue 12/02/2021 No Yes Inactive Problems Resolved Problems Electronic Signature(s) Signed: 12/02/2021 2:31:06 PM By: Fredirick Maudlin MD FACS Previous Signature: 12/02/2021 1:43:42 PM Version By: Fredirick Maudlin MD FACS Entered By: Fredirick Maudlin on 12/02/2021 14:31:06 -------------------------------------------------------------------------------- Progress Note Details Patient Name: Date of Service: [place], [place] Kristen [name] 12/02/2021 1:30 PM Medical Record Number: [id number] Patient Account Number: [id number] Date of Birth/Sex: Treating RN: [birth year] (76 y.o. F) Primary Care Provider: Scarlette Calico Other Clinician: Referring Provider: Treating Provider/Extender: Rudell Cobb in Treatment: 0 Subjective Chief  Complaint Information obtained from Patient Patient presents to the wound care center due with non-wound condition(s)--had sacral pressure ulcer, but is healed History of Present Illness (HPI) CONSULTATION ONLY 12/02/2021 This is a 76 year old woman with poorly controlled type 2 diabetes (last hemoglobin A1c was 8.4), congestive heart failure, hypertension, coronary artery disease, and stage III kidney disease. She reports that in August, she developed a wound on her sacrum. The electronic medical record indicates that it was a stage II. She says that she has been sleeping in her recliner and as a result, was likely sleeping with much of her weight in this location. A provider at her primary care doctor's office recommended that she apply Gerhard's Butt paste to the site. On examination today, the wound is completely healed. Patient History Information obtained from Patient. Allergies clindamycin (Severity: Moderate, Reaction: diarrhea), codeine (Severity: Moderate, Reaction: rash), doxycycline (Severity: Moderate, Reaction: diarrhea), morphine (Severity: Severe, Reaction: hives), pentazocine (Severity: Moderate, Reaction: itching, N/V), Vibramycin (Severity: Moderate, Reaction: diarrhea), clindamycin (Severity: Moderate), metformin (Severity: Moderate, Reaction: diarrhea), pentazocine (Severity: Mild, Reaction: nausea only), sulfa antibiotics (Severity: Moderate), Sulfa (Sulfonamide Antibiotics) (Severity: Moderate, Reaction: rash) Family History Cancer - Paternal Grandparents, Diabetes - Mother, Heart Disease - Father, Hypertension - Mother, Kidney Disease - Mother, Stroke - Mother. Social History Former smoker - quit 2003, Marital Status - Married, Alcohol Use - Never, Drug Use - No History, Caffeine Use - Moderate. Medical History Eyes Denies history of Cataracts, Glaucoma, Optic Neuritis Respiratory Denies history of Aspiration, Asthma, Chronic Obstructive Pulmonary Disease (COPD),  Pneumothorax, Sleep Apnea, Tuberculosis Cardiovascular Patient has history of Congestive Heart Failure, Coronary Artery Disease, Hypertension, Peripheral Venous Disease Endocrine Patient has history of Type II Diabetes Musculoskeletal Patient has history of Gout Neurologic Patient has history of Neuropathy Psychiatric Denies history of Anorexia/bulimia, Confinement Anxiety Patient is treated with Insulin, Oral Agents. Blood sugar is tested. Hospitalization/Surgery History - Right/ Left heart cath and coronary graft angiography- 2023. - Left heart catherterization with coronary graft angiogram 2014. - ABD hysterectomy. - Angioplasty. - Cholecystectomy. - Knee arthtroplasty. - Lumber laminectomy. - Sphincterotomy. - T onsillectomy. Medical A Surgical History Notes nd Cardiovascular AFIB Musculoskeletal fibromyalgia Psychiatric depression Review of Systems (ROS) Constitutional Symptoms (General Health) Denies complaints or symptoms of Fatigue, Fever, Chills, Marked Weight Change. Eyes Denies complaints or symptoms of Dry Eyes, Vision Changes, Glasses / Contacts. Cardiovascular Denies complaints or symptoms of Chest pain. Gastrointestinal Denies complaints or symptoms of Frequent diarrhea, Nausea, Vomiting. Integumentary (Skin) sacrum Musculoskeletal Denies complaints or symptoms of Muscle Pain, Muscle Weakness. Psychiatric Complains  or has symptoms of Claustrophobia. Objective Constitutional No acute distress. Vitals Time Taken: 1:30 PM, Height: 61 in, Source: Stated, Weight: 162 lbs, Source: Stated, BMI: 30.6, Temperature: 98.4 F, Pulse: 71 bpm, Respiratory Rate: 18 breaths/min, Blood Pressure: 113/73 mmHg, Capillary Blood Glucose: 132 mg/dl. Respiratory Normal work of breathing on room air.. General Notes: 11/24/2021: On physical examination, there is a small linear strip of eschar just to the left of her natal cleft. This was removed and the wound is completely healed  underneath. Integumentary (Hair, Skin) Wound #1 status is Open. Original cause of wound was Pressure Injury. The date acquired was: 10/20/2021. The wound is located on the Left Sacrum. The wound measures 0cm length x 0cm width x 0cm depth; 0cm^2 area and 0cm^3 volume. There is Fat Layer (Subcutaneous Tissue) exposed. There is no tunneling or undermining noted. There is a none present amount of drainage noted. There is large (67-100%) granulation within the wound bed. There is a small (1-33%) amount of necrotic tissue within the wound bed. Assessment Active Problems ICD-10 Type 2 diabetes mellitus with hyperglycemia Atherosclerotic heart disease of native coronary artery with unspecified angina pectoris Chronic combined systolic (congestive) and diastolic (congestive) heart failure Chronic kidney disease, stage 3a Personal history of diseases of the skin and subcutaneous tissue Plan [facility]charge [facility] Services: Discharge from [place]! 12/02/2021: This is a 76 year old patient who had a stage II sacral pressure ulcer, according to the electronic medical record. It has healed and she does not have any need for wound care services today. We did discuss the etiology of her wound and adaptations she can make to her sleeping patterns and positioning to try and prevent future episodes. She may follow-up as needed. Electronic Signature(s) Signed: 12/02/2021 2:37:06 PM By: Fredirick Maudlin MD FACS Entered By: Fredirick Maudlin on 12/02/2021 14:37:06 -------------------------------------------------------------------------------- HxROS Details Patient Name: Date of Service: [place], [place] Kristen [name] 12/02/2021 1:30 PM Medical Record Number: [id number] Patient Account Number: [id number] Date of Birth/Sex: Treating RN: Jul 22, 1945 (76 y.o. [name] Primary Care Provider: Scarlette Calico Other Clinician: Referring Provider: Treating Provider/Extender: Olene Floss Weeks in Treatment: 0 Information Obtained From Patient Constitutional Symptoms (General Health) Complaints and Symptoms: Negative for: Fatigue; Fever; Chills; Marked Weight Change Eyes Complaints and Symptoms: Negative for: Dry Eyes; Vision Changes; Glasses / Contacts Medical History: Negative for: Cataracts; Glaucoma; Optic Neuritis Cardiovascular Complaints and Symptoms: Negative for: Chest pain Medical History: Positive for: Congestive Heart Failure; Coronary Artery Disease; Hypertension; Peripheral Venous Disease Past Medical History Notes: AFIB Gastrointestinal Complaints and Symptoms: Negative for: Frequent diarrhea; Nausea; Vomiting Musculoskeletal Complaints and Symptoms: Negative for: Muscle Pain; Muscle Weakness Medical History: Positive for: Gout Past Medical History Notes: fibromyalgia Psychiatric Complaints and Symptoms: Positive for: Claustrophobia Medical History: Negative for: Anorexia/bulimia; Confinement Anxiety Past Medical History Notes: depression Respiratory Medical History: Negative for: Aspiration; Asthma; Chronic Obstructive Pulmonary Disease (COPD); Pneumothorax; Sleep Apnea; Tuberculosis Endocrine Medical History: Positive for: Type II Diabetes Time with diabetes: 2004 Treated with: Insulin, Oral agents Blood sugar tested every day: Yes Tested : 2 Integumentary (Skin) Complaints and Symptoms: Review of System Notes: sacrum Neurologic Medical History: Positive for: Neuropathy Immunizations Pneumococcal Vaccine: Received Pneumococcal Vaccination: Yes Received Pneumococcal Vaccination On or After 60th Birthday: Yes Implantable Devices No devices added Hospitalization / Surgery History Type of Hospitalization/Surgery Right/ Left heart cath and coronary graft angiography- 2023 Left heart catherterization with coronary graft angiogram 2014 ABD hysterectomy Angioplasty Cholecystectomy Knee  arthtroplasty Lumber laminectomy  Sphincterotomy Tonsillectomy Family and Social History Cancer: Yes - Paternal Grandparents; Diabetes: Yes - Mother; Heart Disease: Yes - Father; Hypertension: Yes - Mother; Kidney Disease: Yes - Mother; Stroke: Yes - Mother; Former smoker - quit 2003; Marital Status - Married; Alcohol Use: Never; Drug Use: No History; Caffeine Use: Moderate; Financial Concerns: No; Food, Clothing or Shelter Needs: No; Support System Lacking: No; Transportation Concerns: No Physician Affirmation I have reviewed and agree with the above information. Electronic Signature(s) Signed: 12/02/2021 2:53:26 PM By: Fredirick Maudlin MD FACS Signed: 12/03/2021 4:38:59 PM By: Blanche East RN Entered By: Blanche East on 12/02/2021 13:52:42 -------------------------------------------------------------------------------- [place] Details Patient Name: Date of Service: [place], [place] Kristen [name] 12/02/2021 Medical Record Number: [id number] Patient Account Number: [id number] Date of Birth/Sex: Treating RN: June 23, 1945 (76 y.o. Iver Nestle, Jamie Primary Care Provider: Scarlette Calico Other Clinician: Referring Provider: Treating Provider/Extender: Olene Floss Weeks in Treatment: 0 Diagnosis Coding ICD-10 Codes Code Description E11.65 Type 2 diabetes mellitus with hyperglycemia I25.119 Atherosclerotic heart disease of native coronary artery with unspecified angina pectoris I50.42 Chronic combined systolic (congestive) and diastolic (congestive) heart failure N18.31 Chronic kidney disease, stage 3a Z87.2 Personal history of diseases of the skin and subcutaneous tissue Facility Procedures CPT4 Code: [id number] Description: WOUND CARE VISIT-LEV 3 NEW PT Modifier: 25 Quantity: 1 Physician Procedures : CPT4 Code Description Modifier [id number] WC PHYS LEVEL 3 NEW PT ICD-10 Diagnosis Description Z87.2 Personal history of diseases of the skin and subcutaneous tissue E11.65 Type  2 diabetes mellitus with hyperglycemia I50.42 Chronic combined systolic  (congestive) and diastolic (congestive) heart failure N18.31 Chronic kidney disease, stage 3a Quantity: 1 Electronic Signature(s) Signed: 12/03/2021 7:35:42 AM By: Fredirick Maudlin MD FACS Signed: 12/03/2021 4:38:59 PM By: Blanche East RN Previous Signature: 12/02/2021 2:37:23 PM Version By: Fredirick Maudlin MD FACS Entered By: Blanche East on 12/02/2021 16:03:29

## 2021-12-02 NOTE — Telephone Encounter (Signed)
Patient states she recently had a cath done and her incision site is healed, but the top of her thigh and lower back hurts. She says the lower back could just be her decubitus ulcer in her back. She says she wants to make sure she does not have a clot somewhere. She says she did want to stay for 24 hrs after her procedure due to the pain, but they refused. She says she was just given tylenol and it did not help. She says the pain did ease when she got home and she does take pain medication for her arthritis too. She says she has an appointment with the wound center at 1:00 pm so if she cannot be reached before this, to call her after 4:00 pm.

## 2021-12-03 ENCOUNTER — Other Ambulatory Visit: Payer: Self-pay | Admitting: Internal Medicine

## 2021-12-03 NOTE — Progress Notes (Signed)
Kristen [name] ([id number]) Visit Report for 12/02/2021 Allergy List Details Patient Name: Date of Service: [place], [name] Kristen [name] 12/02/2021 1:30 PM Medical Record Number: [id number] Patient Account Number: [id number] Date of Birth/Sex: Treating RN: [birth year] (76 y.o. Iver Nestle, Jamie Primary Care Zackariah Vanderpol: Scarlette Calico Other Clinician: Referring Hava Massingale: Treating Carine [name]/Extender: Hester Mates, Vickie Weeks in Treatment: 0 Allergies Active Allergies clindamycin Reaction: diarrhea Severity: Moderate codeine Reaction: rash Severity: Moderate doxycycline Reaction: diarrhea Severity: Moderate morphine Reaction: hives Severity: Severe pentazocine Reaction: itching, N/V Severity: Moderate Vibramycin Reaction: diarrhea Severity: Moderate clindamycin Severity: Moderate metformin Reaction: diarrhea Severity: Moderate pentazocine Reaction: nausea only Severity: Mild sulfa antibiotics Severity: Moderate Type: Medication Sulfa (Sulfonamide Antibiotics) Reaction: rash Severity: Moderate Allergy Notes Electronic Signature(s) Signed: 12/03/2021 4:38:59 PM By: Blanche East RN Entered By: Blanche East on 12/02/2021 13:41:24 -------------------------------------------------------------------------------- Arrival Information Details Patient Name: Date of Service: MCA DO Kristen Most, [place] Kristen [name] 12/02/2021 1:30 PM Medical Record Number: [id number] Patient Account Number: [id number] Date of Birth/Sex: Treating RN: 06-Mar-1946 (76 y.o. Kristen [name] Primary Care Diamante Truszkowski: Scarlette Calico Other Clinician: Referring Shikha Bibb: Treating Itai Barbian/Extender: Rudell Cobb in Treatment: 0 Visit Information Patient Arrived: Lyndel Pleasure Time: 13:29 Accompanied By: self Transfer Assistance: None Patient Identification Verified: Yes Secondary Verification Process Completed: Yes Patient Requires Transmission-Based Precautions: No Patient Has  Alerts: No Electronic Signature(s) Signed: 12/03/2021 4:38:59 PM By: Blanche East RN Entered By: Blanche East on 12/02/2021 13:29:46 -------------------------------------------------------------------------------- Clinic Level of Care Assessment Details Patient Name: Date of Service: MCA DO Kristen [name], [place] 12/02/2021 1:30 PM Medical Record Number: [id number] Patient Account Number: [id number] Date of Birth/Sex: Treating RN: Sep 29, 1945 (76 y.o. Kristen [name] Primary Care Jayziah Bankhead: Scarlette Calico Other Clinician: Referring Temeca Somma: Treating Desa Rech/Extender: Olene Floss Weeks in Treatment: 0 Clinic Level of Care Assessment Items TOOL 1 Quantity Score X- 1 0 Use when EandM and Procedure is performed on INITIAL visit ASSESSMENTS - Nursing Assessment / Reassessment X- 1 20 General Physical Exam (combine w/ comprehensive assessment (listed just below) when performed on new pt. evals) X- 1 25 Comprehensive Assessment (HX, ROS, Risk Assessments, Wounds Hx, etc.) ASSESSMENTS - Wound and Skin Assessment / Reassessment X- 1 10 Dermatologic / Skin Assessment (not related to wound area) ASSESSMENTS - Ostomy and/or Continence Assessment and Care '[]'$  - 0 Incontinence Assessment and Management '[]'$  - 0 Ostomy Care Assessment and Management (repouching, etc.) PROCESS - Coordination of Care X - Simple Patient / Family Education for ongoing care 1 15 '[]'$  - 0 Complex (extensive) Patient / Family Education for ongoing care X- 1 10 Staff obtains Programmer, systems, Records, T Results / Process Orders est X- 1 10 Staff telephones HHA, Nursing Homes / Clarify orders / etc X- 1 10 Routine Transfer to another Facility (non-emergent condition) '[]'$  - 0 Routine Hospital Admission (non-emergent condition) '[]'$  - 0 New Admissions / Biomedical engineer / Ordering NPWT Apligraf, etc. , '[]'$  - 0 Emergency Hospital Admission (emergent condition) PROCESS - Special Needs '[]'$  - 0 Pediatric /  Minor Patient Management '[]'$  - 0 Isolation Patient Management '[]'$  - 0 Hearing / Language / Visual special needs '[]'$  - 0 Assessment of Community assistance (transportation, D/C planning, etc.) '[]'$  - 0 Additional assistance / Altered mentation '[]'$  - 0 Support Surface(s) Assessment (bed, cushion, seat, etc.) INTERVENTIONS - Miscellaneous '[]'$  - 0 External ear exam '[]'$  - 0 Patient Transfer (multiple staff / Civil Service fast streamer / Similar devices) '[]'$  - 0 Simple Staple / Suture removal (25 or less) '[]'$  -  0 Complex Staple / Suture removal (26 or more) '[]'$  - 0 Hypo/Hyperglycemic Management (do not check if billed separately) '[]'$  - 0 Ankle / Brachial Index (ABI) - do not check if billed separately Has the patient been seen at the hospital within the last three years: Yes Total Score: 100 Level Of Care: New/Established - Level 3 Electronic Signature(s) Signed: 12/03/2021 4:38:59 PM By: Blanche East RN Entered By: Blanche East on 12/02/2021 16:03:06 -------------------------------------------------------------------------------- Encounter Discharge Information Details Patient Name: Date of Service: [place], [place] 12/02/2021 1:30 PM Medical Record Number: [id number] Patient Account Number: [id number] Date of Birth/Sex: Treating RN: [birth year] (76 y.o. Kristen [name] Primary Care Latrell Potempa: Scarlette Calico Other Clinician: Referring Kristen [name]: Treating Kristen [name]/Extender: Rudell Cobb in Treatment: 0 Encounter Discharge Information Items Discharge Condition: Stable Ambulatory Status: Cane Discharge Destination: Home Transportation: Private Auto Accompanied By: self Schedule Follow-up Appointment: Yes Clinical Summary of Care: Electronic Signature(s) Signed: 12/03/2021 4:38:59 PM By: Blanche East RN Entered By: Blanche East on 12/02/2021 14:28:57 -------------------------------------------------------------------------------- Lower Extremity Assessment  Details Patient Name: Date of Service: [place], [place] 12/02/2021 1:30 PM Medical Record Number: [id number] Patient Account Number: [id number] Date of Birth/Sex: Treating RN: 12/08/1945 (76 y.o. Kristen [name] Primary Care Dontee Jaso: Scarlette Calico Other Clinician: Referring Jennika Ringgold: Treating Israella Hubert/Extender: Olene Floss Weeks in Treatment: 0 Electronic Signature(s) Signed: 12/03/2021 4:38:59 PM By: Blanche East RN Entered By: Blanche East on 12/02/2021 13:56:59 -------------------------------------------------------------------------------- Multi Wound Chart Details Patient Name: Date of Service: [place], [place] Kristen [name] 12/02/2021 1:30 PM Medical Record Number: [id number] Patient Account Number: [id number] Date of Birth/Sex: Treating RN: Feb 01, 1946 (76 y.o. F) Primary Care Kendrah Lovern: Scarlette Calico Other Clinician: Referring Jacci Ruberg: Treating Emberlynn Riggan/Extender: Hester Mates, Vickie Weeks in Treatment: 0 Vital Signs Height(in): 61 Capillary Blood Glucose(mg/dl): 132 Weight(lbs): 162 Pulse(bpm): 71 Body Mass Index(BMI): 30.6 Blood Pressure(mmHg): 113/73 Temperature(F): 98.4 Respiratory Rate(breaths/min): 18 Photos: [N/A:N/A] Left Sacrum N/A N/A Wound Location: Pressure Injury N/A N/A Wounding Event: Atypical N/A N/A Primary Etiology: Congestive Heart Failure, Coronary N/A N/A Comorbid History: Artery Disease, Hypertension, Peripheral Venous Disease, Type II Diabetes, Gout, Neuropathy 10/20/2021 N/A N/A Date Acquired: 0 N/A N/A Weeks of Treatment: Open N/A N/A Wound Status: No N/A N/A Wound Recurrence: 0x0x0 N/A N/A Measurements L x W x D (cm) 0 N/A N/A A (cm) : rea 0 N/A N/A Volume (cm) : Full Thickness Without Exposed N/A N/A Classification: Support Structures None Present N/A N/A Exudate Amount: Large (67-100%) N/A N/A Granulation Amount: Small (1-33%) N/A N/A Necrotic Amount: Fat Layer  (Subcutaneous Tissue): Yes N/A N/A Exposed Structures: Fascia: No Tendon: No Muscle: No Joint: No Treatment Notes Wound #1 (Sacrum) Wound Laterality: Left Cleanser Peri-Wound Care Topical Primary Dressing Secondary Dressing Secured With Compression Wrap Compression Stockings Add-Ons Electronic Signature(s) Signed: 12/02/2021 2:31:13 PM By: Fredirick Maudlin MD FACS Entered By: Fredirick Maudlin on 12/02/2021 14:31:13 -------------------------------------------------------------------------------- Multi-Disciplinary Care Plan Details Patient Name: Date of Service: MCA DO Kristen Most, PA Kristen [name] 12/02/2021 1:30 PM Medical Record Number: [id number] Patient Account Number: [id number] Date of Birth/Sex: Treating RN: 21-Apr-1945 (76 y.o. Kristen [name] Primary Care Amulya Quintin: Scarlette Calico Other Clinician: Referring Neyda Durango: Treating Reymundo Winship/Extender: Olene Floss Weeks in Treatment: 0 Active Inactive Nutrition Nursing Diagnoses: Impaired glucose control: actual or potential Goals: Patient/caregiver will maintain therapeutic glucose control Date Initiated: 12/02/2021 Target Resolution Date: 12/30/2021 Goal Status: Active Interventions: Provide education on elevated blood sugars and impact on wound healing Treatment Activities: Dietary management  education, guidance and counseling : 12/02/2021 Notes: Orientation to the Wound Care Program Nursing Diagnoses: Knowledge deficit related to the wound healing center program Goals: Patient/caregiver will verbalize understanding of the [place] Program Date Initiated: 12/02/2021 Target Resolution Date: 12/09/2021 Goal Status: Active Interventions: Provide education on orientation to the wound center Notes: Electronic Signature(s) Signed: 12/03/2021 4:38:59 PM By: Blanche East RN Entered By: Blanche East on 12/02/2021  14:21:47 -------------------------------------------------------------------------------- Pain Assessment Details Patient Name: Date of Service: MCA DO Kristen [name], [place] 12/02/2021 1:30 PM Medical Record Number: [id number] Patient Account Number: [id number] Date of Birth/Sex: Treating RN: [birth year] (76 y.o. Kristen [name] Primary Care Theordore Cisnero: Scarlette Calico Other Clinician: Referring Shanna Strength: Treating Akili Corsetti/Extender: Olene Floss Weeks in Treatment: 0 Active Problems Location of Pain Severity and Description of Pain Patient Has Paino Yes Site Locations Pain Location: Generalized Pain Rate the pain. Current Pain Level: 6 Character of Pain Describe the Pain: Aching Pain Management and Medication Current Pain Management: Electronic Signature(s) Signed: 12/03/2021 4:38:59 PM By: Blanche East RN Entered By: Blanche East on 12/02/2021 14:09:59 -------------------------------------------------------------------------------- Patient/Caregiver Education Details Patient Name: Date of Service: MCA DO Kristen Most, [place] Kristen [name] 9/28/2023andnbsp1:30 PM Medical Record Number: [id number] Patient Account Number: [id number] Date of Birth/Gender: Treating RN: Jul 12, 1945 (76 y.o. Kristen [name] Primary Care Physician: Scarlette Calico Other Clinician: Referring Physician: Treating Physician/Extender: Rudell Cobb in Treatment: 0 Education Assessment Education Provided To: Patient Education Topics Provided Elevated Blood Sugar/ Impact on Healing: Methods: Explain/Verbal Responses: Reinforcements needed, State content correctly [place]: o Methods: Explain/Verbal Responses: Reinforcements needed, State content correctly Electronic Signature(s) Signed: 12/03/2021 4:38:59 PM By: Blanche East RN Entered By: Blanche East on 12/02/2021  14:22:07 -------------------------------------------------------------------------------- Wound Assessment Details Patient Name: Date of Service: MCA DO Kristen [name], [place] 12/02/2021 1:30 PM Medical Record Number: [id number] Patient Account Number: [id number] Date of Birth/Sex: Treating RN: 07-Mar-1946 (76 y.o. Iver Nestle, Jamie Primary Care Briea Mcenery: Scarlette Calico Other Clinician: Referring Caoimhe Damron: Treating Wolfe Camarena/Extender: Olene Floss Weeks in Treatment: 0 Wound Status Wound Number: 1 Primary Atypical Etiology: Wound Location: Left Sacrum Wound Open Wounding Event: Pressure Injury Status: Date Acquired: 10/20/2021 Comorbid Congestive Heart Failure, Coronary Artery Disease, Hypertension, Weeks Of Treatment: 0 History: Peripheral Venous Disease, Type II Diabetes, Gout, Neuropathy Clustered Wound: No Photos Wound Measurements Length: (cm) Width: (cm) Depth: (cm) Area: (cm) Volume: (cm) 0 % Reduction in Area: 0 % Reduction in Volume: 0 Tunneling: No 0 Undermining: No 0 Wound Description Classification: Full Thickness Without Exposed Support Structures Exudate Amount: None Present Foul Odor After Cleansing: No Slough/Fibrino No Wound Bed Granulation Amount: Large (67-100%) Exposed Structure Necrotic Amount: Small (1-33%) Fascia Exposed: No Fat Layer (Subcutaneous Tissue) Exposed: Yes Tendon Exposed: No Muscle Exposed: No Joint Exposed: No Electronic Signature(s) Signed: 12/03/2021 4:38:59 PM By: Blanche East RN Entered By: Blanche East on 12/02/2021 14:29:32 -------------------------------------------------------------------------------- [place] Details Patient Name: Date of Service: MCA DO Kristen [name], [place] 12/02/2021 1:30 PM Medical Record Number: [id number] Patient Account Number: [id number] Date of Birth/Sex: Treating RN: September 26, 1945 (76 y.o. Iver Nestle, Jamie Primary Care Earla Charlie: Scarlette Calico Other Clinician: Referring  Cniyah Sproull: Treating Sausha Raymond/Extender: Olene Floss Weeks in Treatment: 0 Vital Signs Time Taken: 13:30 Temperature (F): 98.4 Height (in): 61 Pulse (bpm): 71 Source: Stated Respiratory Rate (breaths/min): 18 Weight (lbs): 162 Blood Pressure (mmHg): 113/73 Source: Stated Capillary Blood Glucose (mg/dl): 132 Body Mass Index (BMI): 30.6 Reference Range: 80 - 120 mg / dl  Electronic Signature(s) Signed: 12/03/2021 4:38:59 PM By: Blanche East RN Entered By: Blanche East on 12/02/2021 13:36:14

## 2021-12-03 NOTE — Progress Notes (Signed)
Kristen [name], Kristen [name] ([id number]) Visit Report for 12/02/2021 Abuse Risk Screen Details Patient Name: Date of Service: Kristen [name], [name] 12/02/2021 1:30 PM Medical Record Number: [id number] Patient Account Number: [id number] Date of Birth/Sex: Treating RN: 1946/02/23 (76 y.o. [name] Primary Care Katora Fini: Scarlette Calico Other Clinician: Referring Laya Letendre: Treating Asir Bingley/Extender: Olene Floss Weeks in Treatment: 0 Abuse Risk Screen Items Answer ABUSE RISK SCREEN: Has anyone close to you tried to hurt or harm you recentlyo No Do you feel uncomfortable with anyone in your familyo No Has anyone forced you do things that you didnt want to doo No Electronic Signature(s) Signed: 12/03/2021 4:38:59 PM By: Blanche East RN Entered By: Blanche East on 12/02/2021 13:53:39 -------------------------------------------------------------------------------- Activities of Daily Living Details Patient Name: Date of Service: Kristen [name], Kristen [name] 12/02/2021 1:30 PM Medical Record Number: [id number] Patient Account Number: [id number] Date of Birth/Sex: Treating RN: October 16, 1945 (76 y.o. [name] Primary Care Isatou Agredano: Scarlette Calico Other Clinician: Referring Towanda Hornstein: Treating Noel Henandez/Extender: Olene Floss Weeks in Treatment: 0 Activities of Daily Living Items Answer Activities of Daily Living (Please select one for each item) Drive Automobile Completely Able T Medications ake Completely Able Use T elephone Completely Able Care for Appearance Completely Able Use T oilet Completely Able Bath / Shower Completely Able Dress Self Completely Able Feed Self Completely Able Walk Need Assistance Get In / Out Bed Completely Able Housework Completely Able Prepare Meals Completely [place] for Self Completely Able Electronic Signature(s) Signed: 12/03/2021 4:38:59 PM By: Blanche East RN Entered By:  Blanche East on 12/02/2021 13:54:18 -------------------------------------------------------------------------------- Education Screening Details Patient Name: Date of Service: MCA DO [name], [name] 12/02/2021 1:30 PM Medical Record Number: [id number] Patient Account Number: [id number] Date of Birth/Sex: Treating RN: 11-06-1945 (76 y.o. [name] Primary Care Adrianah Prophete: Scarlette Calico Other Clinician: Referring Sharlotte Baka: Treating Chord Takahashi/Extender: Rudell Cobb in Treatment: 0 Learning Preferences/Education Level/Primary Language Highest Education Level: College or Above Preferred Language: English Cognitive Barrier Language Barrier: No Translator Needed: No Memory Deficit: No Emotional Barrier: No Cultural/Religious Beliefs Affecting Medical Care: No Physical Barrier Impaired Vision: Yes Impaired Hearing: No Decreased Hand dexterity: No Knowledge/Comprehension Knowledge Level: Medium Comprehension Level: Medium Ability to understand written instructions: Medium Ability to understand verbal instructions: Medium Motivation Anxiety Level: Calm Cooperation: Cooperative Education Importance: Acknowledges Need Interest in Health Problems: Asks Questions Perception: Coherent Willingness to Engage in Self-Management High Activities: Readiness to Engage in Self-Management High Activities: Electronic Signature(s) Signed: 12/03/2021 4:38:59 PM By: Blanche East RN Entered By: Blanche East on 12/02/2021 13:54:47 -------------------------------------------------------------------------------- Fall Risk Assessment Details Patient Name: Date of Service: MCA DO [name], [place] 12/02/2021 1:30 PM Medical Record Number: [id number] Patient Account Number: [id number] Date of Birth/Sex: Treating RN: 1945/09/15 (76 y.o. Iver Nestle, Jamie Primary Care Darius Lundberg: Scarlette Calico Other Clinician: Referring Conley Delisle: Treating Kaylan Yates/Extender: Olene Floss Weeks in Treatment: 0 Fall Risk Assessment Items Have you had 2 or more falls in the last 12 monthso 0 Yes Have you had any fall that resulted in injury in the last 12 monthso 0 No FALLS RISK SCREEN History of falling - immediate or within 3 months 25 Yes Secondary diagnosis (Do you have 2 or more medical diagnoseso) 0 No Ambulatory aid None/bed rest/wheelchair/nurse 0 No Crutches/cane/walker 15 Yes Furniture 0 No Intravenous therapy Access/Saline/Heparin Lock 0 No Gait/Transferring Normal/ bed rest/ wheelchair 0 No Weak (short steps with or without shuffle, stooped  but able to lift head while walking, may seek 10 Yes support from furniture) Impaired (short steps with shuffle, may have difficulty arising from chair, head down, impaired 0 No balance) Mental Status Oriented to own ability 0 Yes Electronic Signature(s) Signed: 12/03/2021 4:38:59 PM By: Blanche East RN Entered By: Blanche East on 12/02/2021 13:55:18 -------------------------------------------------------------------------------- Foot Assessment Details Patient Name: Date of Service: [place], [name] 12/02/2021 1:30 PM Medical Record Number: [id number] Patient Account Number: [id number] Date of Birth/Sex: Treating RN: [birth year] (76 y.o. [name] Primary Care Wyolene Weimann: Scarlette Calico Other Clinician: Referring Judyann Casasola: Treating Nochum Fenter/Extender: Olene Floss Weeks in Treatment: 0 Foot Assessment Items Site Locations + = Sensation present, - = Sensation absent, C = Callus, U = Ulcer R = Redness, W = Warmth, M = Maceration, PU = Pre-ulcerative lesion F = Fissure, S = Swelling, D = Dryness Assessment Right: Left: Other Deformity: No No Prior Foot Ulcer: No No Prior Amputation: No No Charcot Joint: No No Ambulatory Status: Ambulatory With Help Assistance Device: Walker Gait: Steady Electronic Signature(s) Signed: 12/03/2021 4:38:59 PM By: Blanche East RN Entered By: Blanche East on 12/02/2021 13:56:54 -------------------------------------------------------------------------------- Nutrition Risk Screening Details Patient Name: Date of Service: MCA DO [name], Kristen [name] 12/02/2021 1:30 PM Medical Record Number: [id number] Patient Account Number: [id number] Date of Birth/Sex: Treating RN: June 03, 1945 (76 y.o. Iver Nestle, [place] Primary Care Caytlyn Evers: Scarlette Calico Other Clinician: Referring Maryan Sivak: Treating Kassi Esteve/Extender: Hester Mates, Vickie Weeks in Treatment: 0 Height (in): 61 Weight (lbs): 162 Body Mass Index (BMI): 30.6 Nutrition Risk Screening Items Score Screening NUTRITION RISK SCREEN: I have an illness or condition that made me change the kind and/or amount of food I eat 2 Yes I eat fewer than two meals per day 0 No I eat few fruits and vegetables, or milk products 0 No I have three or more drinks of beer, liquor or wine almost every day 0 No I have tooth or mouth problems that make it hard for me to eat 2 Yes I don't always have enough money to buy the food I need 0 No I eat alone most of the time 0 No I take three or more different prescribed or over-the-counter drugs a day 1 Yes Without wanting to, I have lost or gained 10 pounds in the last six months 0 No I am not always physically able to shop, cook and/or feed myself 0 No Nutrition Protocols Good Risk Protocol Provide education on elevated blood Moderate Risk Protocol 0 sugars and impact on wound healing, as applicable High Risk Proctocol Risk Level: Moderate Risk Score: 5 Electronic Signature(s) Signed: 12/03/2021 4:38:59 PM By: Blanche East RN Entered By: Blanche East on 12/02/2021 13:56:42

## 2021-12-03 NOTE — Telephone Encounter (Signed)
Returned call to patient who states that her R thigh is not swollen, hurting, or excessively bruised, or hard to touch. Site is "well and doing fine." She still condones that her R leg is hurting from thigh to foot. Still able to wiggle toes and move leg appropriately. She has a "bad knee" that needs to be replaced, and condones a mechanical fall this past Wednesday 12/01/21, where she landed on her floor (carpeted) on that leg/hip. States that the pain has been constant since day of cath, rates pain 7/10. Says she took an oxycodone to help with the pain, which she typically takes for chronic pain/arthritis. She was overall concerned that she may have developed a clot. Assured her that she was bridged with Lovenox and takes Warfarin (which she has completed without missed doses) and has no signs of clotting (such as localized warmth, redness, stiffness, or increased pain). She is assured by this and states she will call Dr Alvan Dame, her orthopedic, because she feels its related to this.

## 2021-12-07 ENCOUNTER — Ambulatory Visit (INDEPENDENT_AMBULATORY_CARE_PROVIDER_SITE_OTHER): Payer: Medicare Other

## 2021-12-07 DIAGNOSIS — Z7901 Long term (current) use of anticoagulants: Secondary | ICD-10-CM

## 2021-12-07 LAB — POCT INR: INR: 2 (ref 2.0–3.0)

## 2021-12-07 NOTE — Progress Notes (Signed)
Continue to take 1 tablet daily except take 1 1/2 tablets on Mondays, Wednesdays, and Fridays. Recheck in 3 weeks.

## 2021-12-07 NOTE — Patient Instructions (Signed)
Continue to take 1 tablet daily except take 1 1/2 tablets on Mondays, Wednesdays, and Fridays. Recheck in 3 weeks.

## 2021-12-08 DIAGNOSIS — M47816 Spondylosis without myelopathy or radiculopathy, lumbar region: Secondary | ICD-10-CM | POA: Diagnosis not present

## 2021-12-08 DIAGNOSIS — G894 Chronic pain syndrome: Secondary | ICD-10-CM | POA: Diagnosis not present

## 2021-12-08 DIAGNOSIS — Z79891 Long term (current) use of opiate analgesic: Secondary | ICD-10-CM | POA: Diagnosis not present

## 2021-12-08 DIAGNOSIS — M150 Primary generalized (osteo)arthritis: Secondary | ICD-10-CM | POA: Diagnosis not present

## 2021-12-15 ENCOUNTER — Ambulatory Visit (HOSPITAL_BASED_OUTPATIENT_CLINIC_OR_DEPARTMENT_OTHER): Payer: Medicare Other | Admitting: General Surgery

## 2021-12-15 ENCOUNTER — Telehealth: Payer: Self-pay

## 2021-12-15 NOTE — Telephone Encounter (Signed)
Patient is calling in stating she is wanting to check up on the status of [facility] assistance.

## 2021-12-23 DIAGNOSIS — M5136 Other intervertebral disc degeneration, lumbar region: Secondary | ICD-10-CM | POA: Diagnosis not present

## 2021-12-23 DIAGNOSIS — M5451 Vertebrogenic low back pain: Secondary | ICD-10-CM | POA: Diagnosis not present

## 2021-12-25 ENCOUNTER — Other Ambulatory Visit: Payer: Self-pay | Admitting: Internal Medicine

## 2021-12-25 DIAGNOSIS — E519 Thiamine deficiency, unspecified: Secondary | ICD-10-CM

## 2021-12-28 NOTE — Telephone Encounter (Signed)
Patient is still waiting to hear from [place] about the status of her mounjaro - Please advise.

## 2021-12-31 ENCOUNTER — Other Ambulatory Visit: Payer: Self-pay | Admitting: Internal Medicine

## 2021-12-31 ENCOUNTER — Ambulatory Visit: Payer: Medicare Other

## 2021-12-31 DIAGNOSIS — E1151 Type 2 diabetes mellitus with diabetic peripheral angiopathy without gangrene: Secondary | ICD-10-CM

## 2022-01-04 DIAGNOSIS — M47816 Spondylosis without myelopathy or radiculopathy, lumbar region: Secondary | ICD-10-CM | POA: Diagnosis not present

## 2022-01-04 DIAGNOSIS — Z79891 Long term (current) use of opiate analgesic: Secondary | ICD-10-CM | POA: Diagnosis not present

## 2022-01-04 DIAGNOSIS — G894 Chronic pain syndrome: Secondary | ICD-10-CM | POA: Diagnosis not present

## 2022-01-04 DIAGNOSIS — M150 Primary generalized (osteo)arthritis: Secondary | ICD-10-CM | POA: Diagnosis not present

## 2022-01-06 NOTE — Telephone Encounter (Signed)
Patient requested a call back from [place] at [phone]

## 2022-01-06 NOTE — Telephone Encounter (Signed)
Pt upset no response from provider or medical assistant staff.  Pt called says she has 2 months worth of OZEMPIC at home. Do you want pt to take it?   Mounjaro was $282.55, she cannot afford.  Pt also asking about Trulicity, she says she would get that free due to her income.  Pt says she will have to go through application process for both Ozempic and for Trulicity, but she would be eligible for patient assistance.  Pt phone [phone]

## 2022-01-06 NOTE — Telephone Encounter (Signed)
Called pt, LVM to discuss.  

## 2022-01-06 NOTE — Telephone Encounter (Signed)
Pt has been informed and would like to know what is her other options are.

## 2022-01-07 ENCOUNTER — Ambulatory Visit (INDEPENDENT_AMBULATORY_CARE_PROVIDER_SITE_OTHER): Payer: Medicare Other

## 2022-01-07 DIAGNOSIS — D510 Vitamin B12 deficiency anemia due to intrinsic factor deficiency: Secondary | ICD-10-CM | POA: Diagnosis not present

## 2022-01-07 DIAGNOSIS — Z7901 Long term (current) use of anticoagulants: Secondary | ICD-10-CM | POA: Diagnosis not present

## 2022-01-07 LAB — POCT INR: INR: 2.4 (ref 2.0–3.0)

## 2022-01-07 MED ORDER — CYANOCOBALAMIN 1000 MCG/ML IJ SOLN
1000.0000 ug | Freq: Once | INTRAMUSCULAR | Status: AC
  Administered 2022-01-07: 1000 ug via INTRAMUSCULAR

## 2022-01-07 NOTE — Patient Instructions (Signed)
Continue to take 1 tablet daily except take 1 1/2 tablets on Mondays, Wednesdays, and Fridays. Recheck on December 5 at 2:30 pm.

## 2022-01-07 NOTE — Progress Notes (Signed)
Continue to take 1 tablet daily except take 1 1/2 tablets on Mondays, Wednesdays, and Fridays. Recheck in 4 weeks.  Vitamin B 12 1,000 mcg also administered this visit in pt's left deltoid. Injection was due, last B 12 lab was in May 2023. Pt tolerated well.

## 2022-01-12 NOTE — Telephone Encounter (Signed)
Called pt, LVM.   

## 2022-01-18 ENCOUNTER — Telehealth: Payer: Self-pay | Admitting: Internal Medicine

## 2022-01-18 NOTE — Telephone Encounter (Signed)
No answer unable to leave a message for patient to call back and schedule Medicare Annual Wellness Visit (AWV).   Please offer to do virtually or by telephone.   Last AWV:01/25/2021   Please schedule at anytime with [facility] -Nurse 2  schedule   45 minute appointent  If any questions, please contact me at [phone]

## 2022-01-18 NOTE — Telephone Encounter (Signed)
Pt called to say during the donut hole, she cannot afford Mounjaro. What else can she take or how can she obtain of financial assistance.  Also, pt asking for someone to show her how to use DEXCOM. Pt says It is the first time it has been called in for her.  Pt phone [phone]

## 2022-01-25 NOTE — Progress Notes (Signed)
Subjective:   Kristen [name] is a 76 y.o. female who presents for Medicare Annual (Subsequent) preventive examination. I connected with  [name] on 01/26/22 by a audio enabled telemedicine application and verified that I am speaking with the correct person using two identifiers.  Patient Location: Home  Provider Location: Home Office  I discussed the limitations of evaluation and management by telemedicine. The patient expressed understanding and agreed to proceed.  Review of Systems    Deferred to PCP Cardiac Risk Factors include: advanced age (>2mn, >>16women);diabetes mellitus;dyslipidemia;hypertension;sedentary lifestyle;obesity (BMI >30kg/m2)     Objective:    There were no vitals filed for this visit. There is no height or weight on file to calculate BMI.     01/26/2022    2:37 PM 11/22/2021    9:34 AM 07/22/2021    1:40 PM 07/21/2021   10:51 PM 07/05/2021    1:13 PM 01/25/2021    3:28 PM 11/13/2019    3:20 PM  Advanced Directives  Does Patient Have a Medical Advance Directive? No Yes _0   Type of ACorporate treasurerof APoquosonLiving will       Does patient want to make changes to medical advance directive?  No - Patient declined       Copy of HBelle Gladein Chart?  No - copy requested       Would patient like information on creating a medical advance directive? No - Patient declined   No - Patient declined  No - Patient declined Yes (MAU/Ambulatory/Procedural Areas - Information given)    Current Medications (verified) Outpatient Encounter Medications as of 01/26/2022  Medication Sig   ALPRAZolam (XANAX) 1 MG tablet Take 1 tablet (1 mg total) by mouth 3 (three) times daily as needed.   atorvastatin (LIPITOR) 40 MG tablet Take 1 tablet (40 mg total) by mouth daily. (Patient taking differently: Take 40 mg by mouth at bedtime.)   Blood Glucose Monitoring Suppl (ONE TOUCH ULTRA 2) w/Device KIT Inject 1 Act  into the skin 3 (three) times daily. Use TID   Continuous Blood Gluc Receiver (DEXCOM G7 RECEIVER) DEVI 1 Act by Does not apply route daily.   Continuous Blood Gluc Sensor (DEXCOM G7 SENSOR) MISC 1 Act by Does not apply route daily.   diclofenac Sodium (VOLTAREN) 1 % GEL Apply 2 g topically 4 (four) times daily. (Patient taking differently: Apply 2 g topically daily as needed (Knees).)   furosemide (LASIX) 20 MG tablet Take 1 tablet (20 mg total) by mouth daily.   gabapentin (NEURONTIN) 300 MG capsule Take ONE capsule by MOUTH three times daily   glucose blood (COOL BLOOD GLUCOSE TEST STRIPS) test strip 1 each by Other route 3 (three) times daily. Use to test blood sugar TID. DX: E11.9   insulin glargine, 2 Unit Dial, (TOUJEO MAX SOLOSTAR) 300 UNIT/ML Solostar Pen Inject 40 Units into the skin daily.   JARDIANCE 25 MG TABS tablet Take 1 tablet (25 mg total) by mouth daily. Via BI CARES pt assistance   Lancets (ONETOUCH ULTRASOFT) lancets Use as instructed   liver oil-zinc oxide (DESITIN) 40 % ointment Apply 1 Application topically as needed for irritation. (Patient taking differently: Apply 1 Application topically daily as needed for irritation. Butt Paste)   magnesium oxide (MAG-OX) 400 (240 Mg) MG tablet Take 1 tablet (400 mg total) by mouth 2 (two) times daily. Take in the morning and evening.   meclizine (ANTIVERT)  25 MG tablet Take 1 tablet (25 mg total) by mouth 3 (three) times daily as needed for dizziness.   metFORMIN (GLUCOPHAGE-XR) 500 MG 24 hr tablet TAKE 3 TABLETS(1500 MG) BY MOUTH DAILY WITH BREAKFAST   metoprolol succinate (TOPROL-XL) 100 MG 24 hr tablet TAKE ONE TABLET BY MOUTH EVERY MORNING and TAKE ONE TABLET BY MOUTH EVERYDAY AT BEDTIME   nitroGLYCERIN (NITROSTAT) 0.4 MG SL tablet Place 1 tablet (0.4 mg total) under the tongue every 5 (five) minutes as needed for chest pain (x 3 doses). Reported on 05/08/2015   NOVOFINE PEN NEEDLE 32G X 6 MM MISC USE with insulin AS DIRECTED    oxyCODONE-acetaminophen (PERCOCET) 10-325 MG tablet Take 1 tablet by mouth every 4 (four) hours as needed for pain.   polyethylene glycol (MIRALAX / GLYCOLAX) 17 g packet Take 17 g by mouth daily.   potassium chloride SA (KLOR-CON M) 20 MEQ tablet TAKE ONE TABLET BY MOUTH EVERY MORNING and TAKE ONE TABLET BY MOUTH EVERYDAY AT BEDTIME   sacubitril-valsartan (ENTRESTO) 97-103 MG Take 1 tablet by mouth 2 (two) times daily.   spironolactone (ALDACTONE) 25 MG tablet TAKE ONE TABLET BY MOUTH ONCE DAILY   thiamine (VITAMIN B1) 100 MG tablet TAKE ONE TABLET BY MOUTH ONCE DAILY   warfarin (COUMADIN) 3 MG tablet TAKE ONE TABLET BY MOUTH DAILY EXCEPT TAKE 1 1/2 TABLETS ON MONDAYS OR AS DIRECTED BY ANTICOAGULATION CLINIC.   enoxaparin (LOVENOX) 120 MG/0.8ML injection Inject 0.8 mLs (120 mg total) into the skin daily. PATIENT GIVEN INSTRUCTIONS IN ANTICOAGULATION CLINIC (Patient not taking: Reported on 01/26/2022)   Plecanatide (TRULANCE) 3 MG TABS Take 1 tablet by mouth daily. (Patient not taking: Reported on 01/26/2022)   No facility-administered encounter medications on file as of 01/26/2022.    Allergies (verified) Metformin and related, Cleocin [clindamycin hcl], Codeine, Doxycycline hyclate, Macrolides and ketolides, Morphine, Pentazocine lactate, Vibramycin [doxycycline calcium], Clindamycin, Definity [perflutren lipid microsphere], Doxycycline, Metformin, Pentazocine, Sulfa antibiotics, and Sulfonamide derivatives   History: Past Medical History:  Diagnosis Date   Arthritis    Blood transfusion without reported diagnosis    Bursitis    CAD (coronary artery disease)    a. s/p CABG 2004.b. stable cath 2014 demonstrating stable CAD and continued patency of her LIMA graft.   Chronic anticoagulation    on coumadin   DDD (degenerative disc disease), lumbar    Depression    Diabetes mellitus    Diastolic dysfunction    per echo in October 2012 with EF 50 to 55%   Fibromyalgia    GERD  (gastroesophageal reflux disease) 10/23/2003   Headache(784.0)    Hyperlipidemia    Hypertension    Hypokalemia    LBBB (left bundle branch block)    Lumbar back pain    LV dysfunction    a. EF 45% by cath 2014. b. EF 50-55% by technically difficult echo in 08/2014.   Lymphadenitis    Morbid obesity ([place])    a. Sleep study negative for significant OSA in 11/2014.   PAF (paroxysmal atrial fibrillation) ([place])    Stroke ([place]) 2004   affected speech per pt   Past Surgical History:  Procedure Laterality Date   ABDOMINAL HYSTERECTOMY     ANGIOPLASTY  laminectomy   CHOLECYSTECTOMY     CORONARY ARTERY BYPASS GRAFT     LIMA to LAD    KNEE ARTHROSCOPY     LEFT HEART CATHETERIZATION WITH CORONARY/GRAFT ANGIOGRAM  12/07/2012   Procedure: LEFT HEART CATHETERIZATION WITH CORONARY/GRAFT ANGIOGRAM;  Surgeon: Blane Ohara, MD;  Location[facility] CATH LAB;  Service: Cardiovascular;;   LUMBAR LAMINECTOMY     x3   RIGHT/LEFT HEART CATH AND CORONARY/GRAFT ANGIOGRAPHY N/A 11/22/2021   Procedure: RIGHT/LEFT HEART CATH AND CORONARY/GRAFT ANGIOGRAPHY;  Surgeon: Sherren Mocha, MD;  Location: [place] CV LAB;  Service: Cardiovascular;  Laterality: N/A;   SPHINCTEROTOMY     TONSILLECTOMY     Family History  Problem Relation Age of Onset   Heart attack Father    Heart disease Father    Stroke Mother    Kidney disease Other    Stroke Other    Arthritis Other    Hypertension Other    Diabetes Other    Colon cancer Paternal Uncle    Depression Sister    Anxiety disorder Maternal Aunt    Social History   Socioeconomic History   Marital status: Married    Spouse name: Not on file   Number of children: Not on file   Years of education: Not on file   Highest education level: Not on file  Occupational History   Not on file  Tobacco Use   Smoking status: Former    Types: Cigarettes    Quit date: 03/07/2001    Years since quitting: 20.9    Passive exposure: Current   Smokeless tobacco: Never   Vaping Use   Vaping Use: Never used  Substance and Sexual Activity   Alcohol use: No   Drug use: No   Sexual activity: Not Currently    Partners: Male  Other Topics Concern   Not on file  Social History Narrative   Lives locally, has help available if needed.   Social Determinants of Health   Financial Resource Strain: Low Risk  (01/26/2022)   Overall Financial Resource Strain (CARDIA)    Difficulty of Paying Living Expenses: Not very hard  Food Insecurity: No Food Insecurity (01/26/2022)   Hunger Vital Sign    Worried About Running Out of Food in the Last Year: Never true    Ran Out of Food in the Last Year: Never true  Transportation Needs: No Transportation Needs (01/26/2022)   PRAPARE - Hydrologist (Medical): No    Lack of Transportation (Non-Medical): No  Physical Activity: Inactive (01/26/2022)   Exercise Vital Sign    Days of Exercise per Week: 0 days    Minutes of Exercise per Session: 0 min  Stress: No Stress Concern Present (01/26/2022)   [place]    Feeling of Stress : Only a little  Social Connections: Moderately Isolated (01/26/2022)   Social Connection and Isolation Panel [NHANES]    Frequency of Communication with Friends and Family: Three times a week    Frequency of Social Gatherings with Friends and Family: Twice a week    Attends Religious Services: Never    Marine scientist or Organizations: No    Attends Music therapist: Never    Marital Status: Married    Tobacco Counseling Counseling given: Not Answered   Clinical Intake:  Pre-visit preparation completed: Yes  Pain : No/denies pain     Nutritional Status: BMI > 30  Obese Nutritional Risks: None Diabetes: Yes CBG done?: No (phone visit) Did pt. bring in CBG monitor from home?: No (phone visit)  How often do you need to have someone help you when you read instructions,  pamphlets, or other written materials from your doctor  or pharmacy?: 1 - Never  Diabetic?Yes Nutrition Risk Assessment:  Has the patient had any N/V/D within the last 2 months?  No  Does the patient have any non-healing wounds?  No  Has the patient had any unintentional weight loss or weight gain?  No   Diabetes:  Is the patient diabetic?  Yes  If diabetic, was a CBG obtained today?  No , phone visit Did the patient bring in their glucometer from home?  No , phone visit How often do you monitor your CBG's? Occasionally.   Financial Strains and Diabetes Management:  Are you having any financial strains with the device, your supplies or your medication? Yes ,  nurse will notify PCP that patient cannot afford Toujeo.  Discussed with patient that she has an upcoming appointment with Pharmacy 02/04/22 and for her to discuss. Patient verbalized understanding.  Does the patient want to be seen by Chronic Care Management for management of their diabetes?  No  Would the patient like to be referred to a Nutritionist or for Diabetic Management?  No   Diabetic Exams:  Diabetic Eye Exam: Overdue for diabetic eye exam. Pt has been advised about the importance in completing this exam. Patient advised to call and schedule an eye exam. Patient states she has an upcoming eye exam appointment Diabetic Foot Exam: Completed 11/21/21   Interpreter Needed?: No  Information entered by :: Emelia Loron RN   Activities of Daily Living    01/26/2022    3:00 PM  In your present state of health, do you have any difficulty performing the following activities:  Hearing? 0  Vision? 0  Difficulty concentrating or making decisions? 0  Walking or climbing stairs? 1  Comment reports she using cane or walker as needed  Dressing or bathing? 0  Doing errands, shopping? 1  Comment spouse [place] and eating ? Y  Comment spouse assist  Using the Toilet? N  In the past six months, have you accidently  leaked urine? N  Do you have problems with loss of bowel control? N  Managing your Medications? N  Managing your Finances? N  Housekeeping or managing your Housekeeping? Y  Comment spouse assist    Patient Care Team: [name], MD as PCP - General (Internal Medicine) Sherren Mocha, MD as PCP - Cardiology (Cardiology) Deboraha Sprang, MD as Consulting Physician (Cardiology) Shon Hough, MD as Consulting Physician (Ophthalmology) Melissa Noon, [place] as Referring Physician (Optometry) Szabat, Darnelle Maffucci, [facility] (Inactive) as Pharmacist (Pharmacist)  Indicate any recent Medical Services you may have received from other than [facility] providers in the past year (date may be approximate).     Assessment:   This is a routine wellness examination for Shaneka.  Hearing/Vision screen No results found.  Dietary issues and exercise activities discussed: Current Exercise Habits: The patient does not participate in regular exercise at present, Exercise limited by: orthopedic condition(s) (pain)   Goals Addressed             This Visit's Progress    Patient Stated       I want to start to eat healthier.       Depression Screen    01/26/2022    2:38 PM 11/16/2021    1:59 PM 09/01/2021    1:24 PM 01/25/2021    3:30 PM 01/25/2021    3:25 PM 01/07/2021    3:30 PM 06/03/2020    2:12 PM  PHQ 2/9 Scores  PHQ -  2 Score 1 0 4 0 0 1 2  PHQ- 9 Score   15    7    Fall Risk    01/26/2022    2:38 PM 11/16/2021    1:59 PM 05/06/2021    3:00 PM 01/25/2021    3:29 PM 01/07/2021    3:30 PM  [place] in the past year? _0 0 0  Comment   reports one fall without injury "a couple of months ago;" continues to use cane as needed    Number falls in past yr: 1 1 0 0 0  Injury with Fall? 0 0 0 0 0  Comment    uses cane N/A- patient denies falls  Risk for fall due to : History of fall(s);Impaired balance/gait;Impaired mobility;Medication side effect Impaired balance/gait Impaired  balance/gait;History of fall(s);Medication side effect;Other (Comment)  Medication side effect;Impaired balance/gait  Risk for fall due to: Comment   chronic pain    Follow up Falls evaluation completed Falls evaluation completed Falls prevention discussed Falls evaluation completed Falls prevention discussed  Comment     uses cane    FALL RISK PREVENTION PERTAINING TO THE HOME:  Any stairs in or around the home? Yes  If so, are there any without handrails? Yes  Home free of loose throw rugs in walkways, pet beds, electrical cords, etc? Yes  Adequate lighting in your home to reduce risk of falls? Yes   ASSISTIVE DEVICES UTILIZED TO PREVENT FALLS:  Life alert? No  Use of a cane, walker or w/c? Yes  Grab bars in the bathroom? No  Shower chair or bench in shower? Yes  Elevated toilet seat or a handicapped toilet? Yes   Cognitive Function:        01/26/2022    2:41 PM  6CIT Screen  What Year? 0 points  What month? 0 points  What time? 0 points  Count back from 20 0 points  Months in reverse 0 points  Repeat phrase 0 points  Total Score 0 points    Immunizations Immunization History  Administered Date(s) Administered   Fluad Quad(high Dose 65+) 11/16/2018, 12/10/2019, 12/10/2020, 11/16/2021   Influenza Split 12/14/2010, 12/12/2011   Influenza Whole 01/05/2006, 01/29/2007, 12/26/2007, 11/28/2008, 12/04/2009   Influenza, High Dose Seasonal PF 11/12/2014, 01/26/2016, 11/25/2016, 12/27/2017   Influenza,inj,Quad PF,6+ Mos 11/12/2012, 04/08/2014   Moderna Sars-Covid-2 Vaccination 04/20/2019, 06/03/2019   Pneumococcal Conjugate-13 08/09/2013   Pneumococcal Polysaccharide-23 12/04/2009, 12/14/2010, 10/12/2016, 11/16/2021   Td 03/07/2005   Tdap 04/14/2016    Flu Vaccine status: Up to date  Pneumococcal vaccine status: Up to date  Covid-19 vaccine status: Information provided on how to obtain vaccines.   Qualifies for Shingles Vaccine? Yes   Zostavax completed No    Shingrix Completed?: No.    Education has been provided regarding the importance of this vaccine. Patient has been advised to call insurance company to determine out of pocket expense if they have not yet received this vaccine. Advised may also receive vaccine at local pharmacy or Health Dept. Verbalized acceptance and understanding.  Screening Tests Health Maintenance  Topic Date Due   MAMMOGRAM  12/26/2020   OPHTHALMOLOGY EXAM  11/19/2021   COLONOSCOPY (Pts 45-68yr Insurance coverage will need to be confirmed)  03/20/2022   HEMOGLOBIN A1C  05/17/2022   Diabetic kidney evaluation - GFR measurement  11/02/2022   Diabetic kidney evaluation - Urine ACR  11/17/2022   FOOT EXAM  11/22/2022   Medicare Annual Wellness (AWV)  01/27/2023   Pneumonia Vaccine 7+ Years old  Completed   INFLUENZA VACCINE  Completed   DEXA SCAN  Completed   Hepatitis C Screening  Completed   HPV VACCINES  Aged Out   COVID-19 Vaccine  Discontinued   Zoster Vaccines- Shingrix  Discontinued    Health Maintenance  Health Maintenance Due  Topic Date Due   MAMMOGRAM  12/26/2020   OPHTHALMOLOGY EXAM  11/19/2021    Colorectal cancer screening: Type of screening: Colonoscopy. Completed 03/20/12. Repeat every 10 years  Mammogram Status: Patient states she will call to make an appointment she is aware that PCP placed a referral previously  Bone Density status: Completed 11/24/16. Results reflect: Bone density results: NORMAL. Repeat every once years.  Lung Cancer Screening: (Low Dose CT Chest recommended if Age 79-80 years, 30 pack-year currently smoking OR have quit w/in 15years.) does not qualify.   Additional Screening:  Hepatitis C Screening: does qualify; Completed 09/01/15  Vision Screening: Recommended annual ophthalmology exams for early detection of glaucoma and other disorders of the eye. Is the patient up to date with their annual eye exam?  No  Who is the provider or what is the name of the office in  which the patient attends annual eye exams? [facility] If pt is not established with a provider, would they like to be referred to a provider to establish care? No . Patient states she has an upcoming eye exam appointment with Dr. Satira Sark.  Dental Screening: Recommended annual dental exams for proper oral hygiene  Community Resource Referral / Chronic Care Management: CRR required this visit?  No   CCM required this visit?  No      Plan:     I have personally reviewed and noted the following in the patient's chart:   Medical and social history Use of alcohol, tobacco or illicit drugs  Current medications and supplements including opioid prescriptions. Patient is currently taking opioid prescriptions. Information provided to patient regarding non-opioid alternatives. Patient advised to discuss non-opioid treatment plan with their provider. Functional ability and status Nutritional status Physical activity Advanced directives List of other physicians Hospitalizations, surgeries, and ER visits in previous 12 months Vitals Screenings to include cognitive, depression, and falls Referrals and appointments  In addition, I have reviewed and discussed with patient certain preventive protocols, quality metrics, and best practice recommendations. A written personalized care plan for preventive services as well as general preventive health recommendations were provided to patient.     Michiel Cowboy, RN   01/26/2022   Nurse Notes:  Ms. Christon Gallaway , Thank you for taking time to come for your Medicare Wellness Visit. I appreciate your ongoing commitment to your health goals. Please review the following plan we discussed and let me know if I can assist you in the future.   These are the goals we discussed:  Goals      Manage My Medicine     Timeframe:  Long-Range Goal Priority:  High Start Date:      07/06/20                       Expected End Date:   07/02/2021               Follow Up Date 10/2021   - call for medicine refill 2 or 3 days before it runs out - call if I am sick and can't take my medicine - keep a list of all the medicines  I take; vitamins and herbals too  -check glucose daily, document, and provide at future appointments -check blood pressure daily, document, and provide at future appointments -Limit carbs to <50 g per meal and <15 g per snack   Why is this important?   These steps will help you keep on track with your medicines.   Notes:      Patient Stated     I want to start to eat healthier.         This is a list of the screening recommended for you and due dates:  Health Maintenance  Topic Date Due   Mammogram  12/26/2020   Eye exam for diabetics  11/19/2021   Colon Cancer Screening  03/20/2022   Hemoglobin A1C  05/17/2022   Yearly kidney function blood test for diabetes  11/02/2022   Yearly kidney health urinalysis for diabetes  11/17/2022   Complete foot exam   11/22/2022   Medicare Annual Wellness Visit  01/27/2023   Pneumonia Vaccine  Completed   Flu Shot  Completed   DEXA scan (bone density measurement)  Completed   Hepatitis C Screening: USPSTF Recommendation to screen - Ages 12-79 yo.  Completed   HPV Vaccine  Aged Out   COVID-19 Vaccine  Discontinued   Zoster (Shingles) Vaccine  Discontinued

## 2022-01-25 NOTE — Patient Instructions (Signed)

## 2022-01-26 ENCOUNTER — Ambulatory Visit (INDEPENDENT_AMBULATORY_CARE_PROVIDER_SITE_OTHER): Payer: Medicare Other | Admitting: *Deleted

## 2022-01-26 DIAGNOSIS — Z0000 Encounter for general adult medical examination without abnormal findings: Secondary | ICD-10-CM

## 2022-02-02 DIAGNOSIS — Z79891 Long term (current) use of opiate analgesic: Secondary | ICD-10-CM | POA: Diagnosis not present

## 2022-02-02 DIAGNOSIS — M150 Primary generalized (osteo)arthritis: Secondary | ICD-10-CM | POA: Diagnosis not present

## 2022-02-02 DIAGNOSIS — G894 Chronic pain syndrome: Secondary | ICD-10-CM | POA: Diagnosis not present

## 2022-02-02 DIAGNOSIS — M47816 Spondylosis without myelopathy or radiculopathy, lumbar region: Secondary | ICD-10-CM | POA: Diagnosis not present

## 2022-02-04 ENCOUNTER — Telehealth: Payer: Self-pay | Admitting: Cardiovascular Disease

## 2022-02-04 ENCOUNTER — Telehealth: Payer: Medicare Other

## 2022-02-04 NOTE — Telephone Encounter (Signed)
Patient states she received an email from the company about patient assistance forms for [facility].  She states it told her she has 30 days to enroll in the patient assistance foundation.  She would like for some to give her a call back about this.

## 2022-02-06 ENCOUNTER — Other Ambulatory Visit: Payer: Self-pay | Admitting: Internal Medicine

## 2022-02-06 ENCOUNTER — Other Ambulatory Visit: Payer: Self-pay | Admitting: Cardiovascular Disease

## 2022-02-06 DIAGNOSIS — E1169 Type 2 diabetes mellitus with other specified complication: Secondary | ICD-10-CM

## 2022-02-06 DIAGNOSIS — E1151 Type 2 diabetes mellitus with diabetic peripheral angiopathy without gangrene: Secondary | ICD-10-CM

## 2022-02-06 DIAGNOSIS — M797 Fibromyalgia: Secondary | ICD-10-CM

## 2022-02-07 ENCOUNTER — Other Ambulatory Visit: Payer: Self-pay | Admitting: Internal Medicine

## 2022-02-07 ENCOUNTER — Telehealth: Payer: Self-pay | Admitting: Internal Medicine

## 2022-02-07 DIAGNOSIS — F411 Generalized anxiety disorder: Secondary | ICD-10-CM

## 2022-02-07 MED ORDER — ALPRAZOLAM 1 MG PO TABS: 1.0000 mg | ORAL_TABLET | Freq: Three times a day (TID) | ORAL | 0 refills | Status: DC | PRN

## 2022-02-07 NOTE — Telephone Encounter (Signed)
**Note De-Identified  Obfuscation** The pt had questions about the providers page of her 3532 NPAF application for Entresto. I advised her that once she has completed her application and obtained required documents per NPAF to bring all to Dr Antionette Char office to drop off in the front office and that we will take care of the providers page of her application and we will fax all to NPAF.  She thanked me for calling her back to discuss.

## 2022-02-07 NOTE — Telephone Encounter (Signed)
Caller & Relationship to patient: PT  Call back number: [phone]  Date of last office visit: 11/30/2021  Date of next office visit: 02/22/2022  Medication(s) to be refilled:  ALPRAZolam Duanne Moron) 1 MG tablet      Preferred Pharmacy:   [facility] - [place], [place] - [place] [address]   FYI PT seems to have not been keeping up with this RX properly. She was under the assumption that not enough medication had been dispensed to cover new dosage (going from twice a day to three times a day). But as far as I can see Dr.Jones did set this for 270 for the three months so I'm not sure.

## 2022-02-07 NOTE — Telephone Encounter (Signed)
I spoke with PT today regarding these issues. I had checked another thread of telephone notes and informed PT of Dr.Jones' last note. PT was wanting to make sure that he was referring to the insulin glargine, 2 Unit Dial, (TOUJEO MAX SOLOSTAR) 300 UNIT/ML Solostar Pen when he stated "Once daily insulin injection".  She is wanting to make sure she stays on regulating her A1C and is hoping that this will be enough to help do so.  CB for PT: [phone]

## 2022-02-08 ENCOUNTER — Ambulatory Visit (INDEPENDENT_AMBULATORY_CARE_PROVIDER_SITE_OTHER): Payer: Medicare Other

## 2022-02-08 DIAGNOSIS — Z7901 Long term (current) use of anticoagulants: Secondary | ICD-10-CM | POA: Diagnosis not present

## 2022-02-08 DIAGNOSIS — D510 Vitamin B12 deficiency anemia due to intrinsic factor deficiency: Secondary | ICD-10-CM | POA: Diagnosis not present

## 2022-02-08 LAB — POCT INR: INR: 2.8 (ref 2.0–3.0)

## 2022-02-08 MED ORDER — CYANOCOBALAMIN 1000 MCG/ML IJ SOLN
1000.0000 ug | Freq: Once | INTRAMUSCULAR | Status: AC
  Administered 2022-02-08: 1000 ug via INTRAMUSCULAR

## 2022-02-08 NOTE — Progress Notes (Signed)
Continue to take 1 tablet daily except take 1 1/2 tablets on Mondays, Wednesdays, and Fridays. Recheck in 5 weeks.  Vitamin B 12 1,000 mcg also administered this visit in pt's left deltoid. Injection was due, last B 12 lab was in May 2023. Pt tolerated well.

## 2022-02-08 NOTE — Patient Instructions (Addendum)
Pre visit review using our clinic review tool, if applicable. No additional management support is needed unless otherwise documented below in the visit note.  Continue to take 1 tablet daily except take 1 1/2 tablets on Mondays, Wednesdays, and Fridays. Recheck in 5 weeks.

## 2022-02-09 ENCOUNTER — Other Ambulatory Visit: Payer: Medicare Other | Admitting: Pharmacist

## 2022-02-09 NOTE — Chronic Care Management (AMB) (Addendum)
02/09/2022 Name: Kristen [name] MRN: [id number] DOB: 1945/07/07  Chief Complaint  Patient presents with   Medication Management    Kristen [name] is a 76 y.o. year old female who presented for a telephone visit.   They were referred to the pharmacist by their Case Management Team  for assistance in managing medication access.   Subjective:  Care Team: Primary Care Provider: [name], MD ; Next Scheduled Visit: 02/22/2022 Cardiologist: Dr. Burt Knack; Next Scheduled Visit: 02/16/2022  Medication Access/Adherence  Current Pharmacy:  [facility] - [place], [place] - [address] [place] [address] Phone: [phone] Fax: [phone]  [facility], [place] [address] [place] [address] Phone: [phone] Fax: [phone]  [facility] [place], [place] [address] [place] [address] Phone: [phone] Fax: [phone]  [facility] (New Address) - [place], [place] AT Previously: Kristen [name], [place] [place] [address] Phone: [phone] Fax: [phone]   Patient reports affordability concerns with their medications: Yes , uses Jardiance PAP Patient reports access/transportation concerns to their pharmacy: No , uses delivery services  Patient reports adherence concerns with their medications:  No    Diabetes: Current Medications: Toujeo 40 units daily, metformin 1500 mg QAM, Jardiance 25 mg daily Medications used in the past: Ozempic (DC'd due to weight loss), Mounjaro (could not afford, ~$280) Statin use: atorvastatin 40 mg daily  BG readings: Has used CGM in the past. Has prescription for Dexcom but would like help placing the sensor at her  follow up visit. BG in the evenings run 200s. Fasting 120-140s.   Dietary habits: patient states she only has a few teeth which requires her to eat soft food. She is trying to get dentures. Also notes that having to eat soft foods has been effecting her INR. INR yesterday was at goal (2.8).  Heart Failure: Volume status & Diuretics: Lasix 20 mg daily. Denies SOB and s/sx of fluid overload. She does get occasional LEE but this usually resolves after rest.  Vasodilators: Entresto  97/103 mg BID  Beta-Blocker: Metoprolol succinate 100 mg BID MRA: spironolactone 25 mg daily Cardiometabolic: Jardiance 25 mg daily (gets through PAP)  Medication Management:  Current adherence strategy: does not use pill organizer. Gets confused when used pill. Keeps all her medicines together. Takes morning medications at 6 AM and evening medications 8 PM.   Patient reports Good adherence to medications  Patient reports the following barriers to adherence: none  Health Maintenance  Health Maintenance Due  Topic Date Due   MAMMOGRAM  12/26/2020   OPHTHALMOLOGY EXAM  11/19/2021     Objective: Lab Results  Component Value Date   HGBA1C 8.4 (H) 11/16/2021    Lab Results  Component Value Date   CREATININE 0.86 11/01/2021   BUN 10 11/01/2021   NA 142 11/22/2021   NA 142 11/22/2021   K 3.6 11/22/2021   K 3.6 11/22/2021   CL 109 (H) 11/01/2021   CO2 17 (L) 11/01/2021    Lab Results  Component Value Date   CHOL 105 11/16/2021   HDL 41.00 11/16/2021   LDLCALC 46 11/16/2021   LDLDIRECT 116.0 11/28/2008   TRIG 90.0 11/16/2021   CHOLHDL 3  11/16/2021    Medications Reviewed Today     Reviewed by Pauletta Browns, [facility] (Pharmacist) on 02/09/22 at 1442  Med List Status: <None>   Medication Order Taking? Sig Documenting Provider Last Dose Status Informant  ALPRAZolam (XANAX) 1 MG tablet [id number] Yes Take 1 tablet (1 mg total) by mouth 3 (three) times daily as needed. [name], MD Taking  Active   atorvastatin (LIPITOR) 40 MG tablet [id number] Yes TAKE ONE TABLET BY MOUTH ONCE DAILY [name], MD Taking Active   Blood Glucose Monitoring Suppl (ONE TOUCH ULTRA 2) w/Device KIT [id number]  Inject 1 Act into the skin 3 (three) times daily. Use TID [name], MD  Active Self  Continuous Blood Gluc Receiver ([place]) [place] [id number] Yes 1 Act by Does not apply route daily. [name], MD Taking Active   Continuous Blood Gluc Sensor (DEXCOM G7 SENSOR) [place] [id number] No 1 Act by Does not apply route daily.  Patient not taking: Reported on 02/09/2022   [name], MD Not Taking Active   diclofenac Sodium (VOLTAREN) 1 % GEL [id number] Yes Apply 2 g topically 4 (four) times daily.  Patient taking differently: Apply 2 g topically daily as needed (Knees).   Raiford Noble [place], DO Taking Active Self  furosemide (LASIX) 20 MG tablet [id number] Yes Take 1 tablet (20 mg total) by mouth daily. Sherren Mocha, MD Taking Active Self  gabapentin (NEURONTIN) 300 MG capsule [id number] Yes TAKE ONE CAPSULE BY MOUTH THREE TIMES DAILY [name], MD Taking Active   glucose blood (COOL BLOOD GLUCOSE TEST STRIPS) test strip [id number]  1 each by Other route 3 (three) times daily. Use to test blood sugar TID. DX: E11.9 [name], MD  Active Self  insulin glargine, 2 Unit Dial, (TOUJEO MAX SOLOSTAR) 300 UNIT/ML Solostar Pen [id number] Yes Inject 40 Units into the skin daily. [name], MD Taking Active   JARDIANCE 25 MG TABS tablet [id number] Yes Take 1 tablet (25 mg total) by mouth daily. Via [employer] pt assistance [name], MD Taking Active Self  Lancets [facility] ULTRASOFT) lancets [id number]  Use as instructed [name], MD  Active Self  liver oil-zinc oxide (DESITIN) 40 % ointment [id number]  Apply 1 Application topically as needed for irritation.  Patient taking differently: Apply 1 Application topically daily as needed for irritation. Butt Paste    [place], Vickie L, NP-C  Active Self  magnesium oxide (MAG-OX) 400 (240 Mg) MG tablet [id number] Yes Take 1 tablet (400 mg total) by mouth 2 (two) times daily. Take in the morning and evening. Sherren Mocha, MD Taking Active Self  meclizine (ANTIVERT) 25 MG tablet [id number] Yes Take 1 tablet (25 mg total) by mouth 3 (three) times daily as needed for dizziness. [name], MD Taking Active Self  metFORMIN (GLUCOPHAGE-XR) 500 MG 24 hr tablet [id number] Yes TAKE 3 TABLETS(1500 MG) BY MOUTH DAILY WITH BREAKFAST [name], MD Taking Active   metoprolol succinate (TOPROL-XL) 100 MG 24 hr tablet [id number] Yes TAKE ONE TABLET BY MOUTH EVERY MORNING and TAKE ONE TABLET BY MOUTH EVERYDAY AT BEDTIME Sherren Mocha, MD Taking Active Self  nitroGLYCERIN (NITROSTAT) 0.4 MG SL tablet [id number]  Place 1 tablet (0.4 mg total) under the tongue every 5 (five) minutes as needed for chest pain (x 3 doses). Reported on 05/08/2015 Sherren Mocha, MD  Active Self  NOVOFINE PEN NEEDLE 32G X 6 MM [place] [id number]  USE with  insulin AS DIRECTED [name], MD  Active Self  oxyCODONE-acetaminophen (PERCOCET) 10-325 MG tablet [id number] Yes Take 1 tablet by mouth every 4 (four) hours as needed for pain. [provider] Taking Active Self  Plecanatide (TRULANCE) 3 MG TABS [id number]  Take 1 tablet by mouth daily.  Patient not taking: Reported on 01/26/2022   [name], MD  Active   polyethylene glycol (MIRALAX / GLYCOLAX) 17 g packet [id number] Yes Take 17 g by mouth daily. Raiford Noble [place], DO Taking Active Self  potassium chloride SA (KLOR-CON M) 20 MEQ tablet [id number] Yes TAKE ONE TABLET BY MOUTH EVERY MORNING and TAKE ONE TABLET BY MOUTH EVERYDAY AT BEDTIME Sherren Mocha, MD Taking Active   sacubitril-valsartan [facility]) 97-103 MG [id number] Yes Take 1 tablet by mouth 2 (two) times daily. Sherren Mocha, MD Taking Active Self  spironolactone (ALDACTONE) 25 MG tablet [id number] Yes TAKE ONE TABLET  BY MOUTH ONCE DAILY [name], MD Taking Active Self  thiamine (VITAMIN B1) 100 MG tablet [id number] Yes TAKE ONE TABLET BY MOUTH ONCE DAILY [name], MD Taking Active   warfarin (COUMADIN) 3 MG tablet [id number] Yes TAKE ONE TABLET BY MOUTH DAILY EXCEPT TAKE 1 1/2 TABLETS ON MONDAYS OR AS DIRECTED BY ANTICOAGULATION CLINIC. [name], MD Taking Active             Assessment/Plan:   Diabetes: - Currently uncontrolled per patient report. She would like to restart Ozempic. Encouraged patient discuss with PCP at follow up in a few weeks. She previously received Ozempic through patient assistance and could not afford Mounjaro.  - Recommend to check glucose daily. - Recommended patient bring Dexcom to follow up visit to be placed.   Heart failure with reduced ejection fraction: - EF 30-35% on 11/03/2021 - Patient states she has Jardiance PAP re-enrollment paperwork to bring to her cardiologist appointment in a few days.  - Recommend to continue current therapy. No s/sx of volume overload. Patient says she has been doing well.   Follow Up Plan:  Pharmacist: PRN PCP visit: 02/22/2022 Cardiology: 02/16/2022  Joseph Art, Pharm.D. PGY-2 Ambulatory Care Pharmacy Resident 02/09/2022 3:12 PM

## 2022-02-10 ENCOUNTER — Encounter: Payer: Self-pay | Admitting: Internal Medicine

## 2022-02-10 DIAGNOSIS — B354 Tinea corporis: Secondary | ICD-10-CM | POA: Diagnosis not present

## 2022-02-10 DIAGNOSIS — R1030 Lower abdominal pain, unspecified: Secondary | ICD-10-CM | POA: Diagnosis not present

## 2022-02-14 ENCOUNTER — Other Ambulatory Visit: Payer: Self-pay | Admitting: Internal Medicine

## 2022-02-14 DIAGNOSIS — F411 Generalized anxiety disorder: Secondary | ICD-10-CM

## 2022-02-14 DIAGNOSIS — L308 Other specified dermatitis: Secondary | ICD-10-CM | POA: Diagnosis not present

## 2022-02-14 DIAGNOSIS — L738 Other specified follicular disorders: Secondary | ICD-10-CM | POA: Diagnosis not present

## 2022-02-14 DIAGNOSIS — L821 Other seborrheic keratosis: Secondary | ICD-10-CM | POA: Diagnosis not present

## 2022-02-14 DIAGNOSIS — D2261 Melanocytic nevi of right upper limb, including shoulder: Secondary | ICD-10-CM | POA: Diagnosis not present

## 2022-02-16 ENCOUNTER — Encounter: Payer: Self-pay | Admitting: Cardiovascular Disease

## 2022-02-16 ENCOUNTER — Ambulatory Visit: Payer: Medicare Other | Attending: Cardiovascular Disease | Admitting: Cardiovascular Disease

## 2022-02-16 ENCOUNTER — Telehealth: Payer: Self-pay | Admitting: Pharmacist

## 2022-02-16 VITALS — BP 124/70 | HR 66 | Ht 61.0 in | Wt 174.4 lb

## 2022-02-16 DIAGNOSIS — E782 Mixed hyperlipidemia: Secondary | ICD-10-CM | POA: Diagnosis not present

## 2022-02-16 DIAGNOSIS — I480 Paroxysmal atrial fibrillation: Secondary | ICD-10-CM

## 2022-02-16 DIAGNOSIS — I2510 Atherosclerotic heart disease of native coronary artery without angina pectoris: Secondary | ICD-10-CM | POA: Diagnosis not present

## 2022-02-16 DIAGNOSIS — I5042 Chronic combined systolic (congestive) and diastolic (congestive) heart failure: Secondary | ICD-10-CM | POA: Diagnosis not present

## 2022-02-16 NOTE — Patient Instructions (Signed)
Medication Instructions:  Your physician recommends that you continue on your current medications as directed. Please refer to the Current Medication list given to you today.  *If you need a refill on your cardiac medications before your next appointment, please call your pharmacy*   Lab Work: NONE If you have labs (blood work) drawn today and your tests are completely normal, you will receive your results only by: [facility] (if you have MyChart) OR A paper copy in the mail If you have any lab test that is abnormal or we need to change your treatment, we will call you to review the results.  Testing/Procedures: ECHO (in 3 months, prior to appt) Your physician has requested that you have an echocardiogram. Echocardiography is a painless test that uses sound waves to create images of your heart. It provides your doctor with information about the size and shape of your heart and how well your heart's chambers and valves are working. This procedure takes approximately one hour. There are no restrictions for this procedure. Please do NOT wear cologne, perfume, aftershave, or lotions (deodorant is allowed). Please arrive 15 minutes prior to your appointment time.  Follow-Up: At [facility], you and your health needs are our priority.  As part of our continuing mission to provide you with exceptional heart care, we have created designated Provider Care Teams.  These Care Teams include your primary Cardiologist (physician) and Advanced Practice Providers (APPs -  Physician Assistants and Nurse Practitioners) who all work together to provide you with the care you need, when you need it.  Your next appointment:   3 month(s)  The format for your next appointment:   In Person  Provider:   Sherren Mocha, MD       Important Information About Sugar

## 2022-02-16 NOTE — Progress Notes (Signed)
Cardiology Office Note:    Date:  02/18/2022   ID:  Kristen [name], DOB 1945-12-20, MRN [id number]  PCP:  [name], MD   [facility] Providers Cardiologist:  Sherren Mocha, MD     Referring MD: [name], MD   Chief Complaint  Patient presents with   Congestive Heart Failure    History of Present Illness:    Kristen [name] is a 76 y.o. female with a hx of coronary artery disease status post CABG in 2004.  Other medical problems include paroxysmal atrial fibrillation on warfarin, history of stroke, obesity, diabetes, hypertension, and left bundle branch block.  In February 2023, an echocardiogram was ordered to reassess LV function in the setting of chronic left bundle branch block.  Her baseline LVEF from 2016 was in the range of 50 to 55%.  Her updated echo demonstrated severe global LV dysfunction with LVEF 25 to 30%.  Her medications were adjusted and she was started on GDMT for heart failure.  She was referred to the Pharm.D. clinic and now is on an excellent regimen that includes Jardiance, furosemide, metoprolol succinate, Entresto, and spironolactone.  She underwent right left heart catheterization demonstrating patent coronary arteries without high-grade obstruction, normal LV and RV filling pressures, and preserved cardiac output.  A follow-up echocardiogram in August 2023 showed modest improvement in LV function with LVEF up to 30 to 35%.  She presents today for follow-up evaluation.  She is having some problems with her living situation.  Otherwise doing okay from a medical perspective.  Reports no changes in her medications.  Has some fatigue and exertional dyspnea which are largely unchanged.  No orthopnea, PND, or leg swelling.  No chest pain or pressure.  Compliant with her medications.  Past Medical History:  Diagnosis Date   Arthritis    Blood transfusion without reported diagnosis    Bursitis    CAD (coronary artery disease)    a. s/p  CABG 2004.b. stable cath 2014 demonstrating stable CAD and continued patency of her LIMA graft.   Chronic anticoagulation    on coumadin   DDD (degenerative disc disease), lumbar    Depression    Diabetes mellitus    Diastolic dysfunction    per echo in October 2012 with EF 50 to 55%   Fibromyalgia    GERD (gastroesophageal reflux disease) 10/23/2003   Headache(784.0)    Hyperlipidemia    Hypertension    Hypokalemia    LBBB (left bundle branch block)    Lumbar back pain    LV dysfunction    a. EF 45% by cath 2014. b. EF 50-55% by technically difficult echo in 08/2014.   Lymphadenitis    Morbid obesity ([place])    a. Sleep study negative for significant OSA in 11/2014.   PAF (paroxysmal atrial fibrillation) ([place])    Stroke ([place]) 2004   affected speech per pt    Past Surgical History:  Procedure Laterality Date   ABDOMINAL HYSTERECTOMY     ANGIOPLASTY  laminectomy   CHOLECYSTECTOMY     CORONARY ARTERY BYPASS GRAFT     LIMA to LAD    KNEE ARTHROSCOPY     LEFT HEART CATHETERIZATION WITH CORONARY/GRAFT ANGIOGRAM  12/07/2012   Procedure: LEFT HEART CATHETERIZATION WITH Beatrix Fetters;  Surgeon: Blane Ohara, MD;  [facility] LAB;  Service: Cardiovascular;;   LUMBAR LAMINECTOMY     x3   RIGHT/LEFT HEART CATH AND CORONARY/GRAFT ANGIOGRAPHY N/A 11/22/2021  Procedure: RIGHT/LEFT HEART CATH AND CORONARY/GRAFT ANGIOGRAPHY;  Surgeon: Sherren Mocha, MD;  Location: [facility] CV LAB;  Service: Cardiovascular;  Laterality: N/A;   SPHINCTEROTOMY     TONSILLECTOMY      Current Medications: Current Meds  Medication Sig   ALPRAZolam (XANAX) 1 MG tablet TAKE ONE TABLET BY MOUTH EVERY MORNING, TAKE 1/2 TABLET midday, AND TAKE 1/2 TABLET AT BEDTIME   atorvastatin (LIPITOR) 40 MG tablet TAKE ONE TABLET BY MOUTH ONCE DAILY   Blood Glucose Monitoring Suppl (ONE TOUCH ULTRA 2) w/Device KIT Inject 1 Act into the skin 3 (three) times daily. Use TID   Continuous Blood Gluc  Receiver (DEXCOM G7 RECEIVER) DEVI 1 Act by Does not apply route daily.   Continuous Blood Gluc Sensor (DEXCOM G7 SENSOR) MISC 1 Act by Does not apply route daily.   diclofenac Sodium (VOLTAREN) 1 % GEL Apply 2 g topically 4 (four) times daily. (Patient taking differently: Apply 2 g topically daily as needed (Knees).)   furosemide (LASIX) 20 MG tablet Take 1 tablet (20 mg total) by mouth daily.   gabapentin (NEURONTIN) 300 MG capsule TAKE ONE CAPSULE BY MOUTH THREE TIMES DAILY   glucose blood (COOL BLOOD GLUCOSE TEST STRIPS) test strip 1 each by Other route 3 (three) times daily. Use to test blood sugar TID. DX: E11.9   insulin glargine, 2 Unit Dial, (TOUJEO MAX SOLOSTAR) 300 UNIT/ML Solostar Pen Inject 40 Units into the skin daily.   JARDIANCE 25 MG TABS tablet Take 1 tablet (25 mg total) by mouth daily. Via [employer] pt assistance   Lancets (ONETOUCH ULTRASOFT) lancets Use as instructed   magnesium oxide (MAG-OX) 400 (240 Mg) MG tablet Take 1 tablet (400 mg total) by mouth 2 (two) times daily. Take in the morning and evening.   meclizine (ANTIVERT) 25 MG tablet Take 1 tablet (25 mg total) by mouth 3 (three) times daily as needed for dizziness.   metFORMIN (GLUCOPHAGE-XR) 500 MG 24 hr tablet TAKE 3 TABLETS(1500 MG) BY MOUTH DAILY WITH BREAKFAST   metoprolol succinate (TOPROL-XL) 100 MG 24 hr tablet TAKE ONE TABLET BY MOUTH EVERY MORNING and TAKE ONE TABLET BY MOUTH EVERYDAY AT BEDTIME   nitroGLYCERIN (NITROSTAT) 0.4 MG SL tablet Place 1 tablet (0.4 mg total) under the tongue every 5 (five) minutes as needed for chest pain (x 3 doses). Reported on 05/08/2015   NOVOFINE PEN NEEDLE 32G X 6 MM MISC USE with insulin AS DIRECTED   oxyCODONE-acetaminophen (PERCOCET) 10-325 MG tablet Take 1 tablet by mouth every 4 (four) hours as needed for pain.   polyethylene glycol (MIRALAX / GLYCOLAX) 17 g packet Take 17 g by mouth daily.   potassium chloride SA (KLOR-CON M) 20 MEQ tablet TAKE ONE TABLET BY MOUTH EVERY  MORNING and TAKE ONE TABLET BY MOUTH EVERYDAY AT BEDTIME   sacubitril-valsartan (ENTRESTO) 97-103 MG Take 1 tablet by mouth 2 (two) times daily.   spironolactone (ALDACTONE) 25 MG tablet TAKE ONE TABLET BY MOUTH ONCE DAILY   thiamine (VITAMIN B1) 100 MG tablet TAKE ONE TABLET BY MOUTH ONCE DAILY   warfarin (COUMADIN) 3 MG tablet TAKE ONE TABLET BY MOUTH DAILY EXCEPT TAKE 1 1/2 TABLETS ON MONDAYS OR AS DIRECTED BY ANTICOAGULATION CLINIC.   [DISCONTINUED] liver oil-zinc oxide (DESITIN) 40 % ointment Apply 1 Application topically as needed for irritation. (Patient taking differently: Apply 1 Application topically daily as needed for irritation. Butt Paste)   [DISCONTINUED] Plecanatide (TRULANCE) 3 MG TABS Take 1 tablet by mouth  daily.     Allergies:   Metformin and related, Cleocin [clindamycin hcl], Codeine, Doxycycline hyclate, Macrolides and ketolides, Morphine, Pentazocine lactate, Vibramycin [doxycycline calcium], Clindamycin, Definity [perflutren lipid microsphere], Doxycycline, Metformin, Pentazocine, Sulfa antibiotics, and Sulfonamide derivatives   Social History   Socioeconomic History   Marital status: Married    Spouse name: Not on file   Number of children: Not on file   Years of education: Not on file   Highest education level: Not on file  Occupational History   Not on file  Tobacco Use   Smoking status: Former    Types: Cigarettes    Quit date: 03/07/2001    Years since quitting: 20.9    Passive exposure: Current   Smokeless tobacco: Never  Vaping Use   Vaping Use: Never used  Substance and Sexual Activity   Alcohol use: No   Drug use: No   Sexual activity: Not Currently    Partners: Male  Other Topics Concern   Not on file  Social History Narrative   Lives locally, has help available if needed.   Social Determinants of Health   Financial Resource Strain: Low Risk  (01/26/2022)   Overall Financial Resource Strain (CARDIA)    Difficulty of Paying Living Expenses:  Not very hard  Food Insecurity: No Food Insecurity (01/26/2022)   Hunger Vital Sign    Worried About Running Out of Food in the Last Year: Never true    Ran Out of Food in the Last Year: Never true  Transportation Needs: No Transportation Needs (01/26/2022)   PRAPARE - Hydrologist (Medical): No    Lack of Transportation (Non-Medical): No  Physical Activity: Inactive (01/26/2022)   Exercise Vital Sign    Days of Exercise per Week: 0 days    Minutes of Exercise per Session: 0 min  Stress: No Stress Concern Present (01/26/2022)   [place]    Feeling of Stress : Only a little  Social Connections: Moderately Isolated (01/26/2022)   Social Connection and Isolation Panel [NHANES]    Frequency of Communication with Friends and Family: Three times a week    Frequency of Social Gatherings with Friends and Family: Twice a week    Attends Religious Services: Never    Marine scientist or Organizations: No    Attends Music therapist: Never    Marital Status: Married     Family History: The patient's family history includes Anxiety disorder in her maternal aunt; Arthritis in an other family member; Colon cancer in her paternal uncle; Depression in her sister; Diabetes in an other family member; Heart attack in her father; Heart disease in her father; Hypertension in an other family member; Kidney disease in an other family member; Stroke in her mother and another family member.  ROS:   Please see the history of present illness.    All other systems reviewed and are negative.  EKGs/Labs/Other Studies Reviewed:    The following studies were reviewed today: Cardiac Cath 11/22/2021: 1.  Patent coronary arteries with mild nonobstructive plaquing of the LAD, wide patency of the left main, circumflex, and RCA. 2.  Patent but atretic LIMA graft due to competitive flow through an LAD that  has no high-grade stenosis 3.  Low normal LV and RV filling pressures, preserved cardiac output, and no evidence of pulmonary hypertension   Recommend medical therapy for nonobstructive CAD and chronic systolic heart failure with  LV dysfunction.  The patient's filling pressures are all in the low normal range and she appears to be very well compensated at present.  Echo 11/03/2021: 1. Left ventricular ejection fraction, by estimation, is 30 to 35%. The  left ventricle has moderately decreased function. The left ventricle  demonstrates regional wall motion abnormalities with severe septal  hypokinesis and septal-lateral dyssynchrony  consistent with LBBB. Left ventricular diastolic parameters are consistent  with Grade I diastolic dysfunction (impaired relaxation).   2. Peak RV-RA gradient 32 mmHg. The IVC was not visualized. Right  ventricular systolic function was not well visualized. The right  ventricular size is not well visualized.   3. The mitral valve is normal in structure. No evidence of mitral valve  regurgitation. No evidence of mitral stenosis.   4. The aortic valve is tricuspid. Aortic valve regurgitation is not  visualized. No aortic stenosis is present.   5. Technically difficult study with poor acoustic windows.    EKG:  EKG is not ordered today.    Recent Labs: 07/27/2021: Magnesium 1.8 09/08/2021: TSH 2.67 11/01/2021: ALT 9; BUN 10; Creatinine, Ser 0.86; NT-Pro BNP 138; Platelets 309 11/22/2021: Hemoglobin 12.2; Hemoglobin 12.2; Potassium 3.6; Potassium 3.6; Sodium 142; Sodium 142  Recent Lipid Panel    Component Value Date/Time   CHOL 105 11/16/2021 1454   CHOL 108 12/26/2017 0758   TRIG 90.0 11/16/2021 1454   HDL 41.00 11/16/2021 1454   HDL 38 (L) 12/26/2017 0758   CHOLHDL 3 11/16/2021 1454   VLDL 18.0 11/16/2021 1454   LDLCALC 46 11/16/2021 1454   LDLCALC 48 12/26/2017 0758   LDLDIRECT 116.0 11/28/2008 1122     Risk Assessment/Calculations:     CHA2DS2-VASc Score = 8   This indicates a 10.8% annual risk of stroke. The patient's score is based upon: CHF History: 1 HTN History: 1 Diabetes History: 1 Stroke History: 2 Vascular Disease History: 1 Age Score: 1 Gender Score: 1            Physical Exam:    VS:  BP 124/70   Pulse 66   Ht 5' 1" (1.549 m)   Wt 174 lb 6.4 oz (79.1 kg)   SpO2 97%   BMI 32.95 kg/m     Wt Readings from Last 3 Encounters:  02/16/22 174 lb 6.4 oz (79.1 kg)  11/30/21 162 lb (73.5 kg)  11/22/21 171 lb (77.6 kg)     GEN:  Well nourished, well developed in no acute distress HEENT: Normal NECK: No JVD; No carotid bruits LYMPHATICS: No lymphadenopathy CARDIAC: RRR, no murmurs, rubs, gallops RESPIRATORY:  Clear to auscultation without rales, wheezing or rhonchi  ABDOMEN: Soft, non-tender, non-distended MUSCULOSKELETAL:  No edema; No deformity  SKIN: Warm and dry NEUROLOGIC:  Alert and oriented x 3 PSYCHIATRIC:  Normal affect   ASSESSMENT:    1. Chronic combined systolic and diastolic CHF (congestive heart failure) ([place])   2. PAF (paroxysmal atrial fibrillation) ([facility])   3. Atherosclerosis of native coronary artery of native heart without angina pectoris   4. Mixed hyperlipidemia    PLAN:    In order of problems listed above:  The patient is clinically stable on a regimen of Jardiance, metoprolol succinate, Entresto, and spironolactone.  Her LVEF has improved from a range of 20 to 25% up to 30 to 35% after about 5 months of medical therapy.  Her cardiac catheterization studies reviewed and demonstrated normal hemodynamics and patency of her coronary arteries.  I am going to continue  her current medications, repeat an echocardiogram in 3 months.  And arrange an APP follow-up visit after the echo is completed.  If her LVEF remains less than 35%, she should be referred for consideration of CRT-D.  Discussed this at length with her today.  Hopefully she will continue to show improvement on  medical therapy. Tolerating warfarin long-term.  She asked me about having 3 teeth pulled.  Advised her that this could be done on warfarin if the dentist is willing or even with a very short interruption of warfarin for just 48 hours.  If she needs a full warfarin hold, she should be bridged with Lovenox because of her history of stroke.  Hopefully this can be done without interruption in her anticoagulation. Recent cardiac catheterization reviewed.  Continue atorvastatin, beta-blockade, and risk reduction measures. Treated with high intensity statin drug.  Last LDL cholesterol was 46.           Medication Adjustments/Labs and Tests Ordered: Current medicines are reviewed at length with the patient today.  Concerns regarding medicines are outlined above.  Orders Placed This Encounter  Procedures   ECHOCARDIOGRAM COMPLETE   No orders of the defined types were placed in this encounter.   Patient Instructions  Medication Instructions:  Your physician recommends that you continue on your current medications as directed. Please refer to the Current Medication list given to you today.  *If you need a refill on your cardiac medications before your next appointment, please call your pharmacy*   Lab Work: NONE If you have labs (blood work) drawn today and your tests are completely normal, you will receive your results only by: [place] (if you have MyChart) OR A paper copy in the mail If you have any lab test that is abnormal or we need to change your treatment, we will call you to review the results.  Testing/Procedures: ECHO (in 3 months, prior to appt) Your physician has requested that you have an echocardiogram. Echocardiography is a painless test that uses sound waves to create images of your heart. It provides your doctor with information about the size and shape of your heart and how well your heart's chambers and valves are working. This procedure takes approximately one  hour. There are no restrictions for this procedure. Please do NOT wear cologne, perfume, aftershave, or lotions (deodorant is allowed). Please arrive 15 minutes prior to your appointment time.  Follow-Up: At [facility], you and your health needs are our priority.  As part of our continuing mission to provide you with exceptional heart care, we have created designated Provider Care Teams.  These Care Teams include your primary Cardiologist (physician) and Advanced Practice Providers (APPs -  Physician Assistants and Nurse Practitioners) who all work together to provide you with the care you need, when you need it.  Your next appointment:   3 month(s)  The format for your next appointment:   In Person  Provider:   Sherren Mocha, MD       Important Information About Sugar         Signed, Sherren Mocha, MD  02/18/2022 6:34 AM    [place]

## 2022-02-16 NOTE — Telephone Encounter (Signed)
Pt dropped off Entresto pt assistance application today. This has been faxed to [employer].

## 2022-02-17 NOTE — Telephone Encounter (Signed)
Received fax from [employer] with Delene Loll pt assistance approval through 03/07/23. Pt is aware and was very appreciative for the call.

## 2022-02-18 ENCOUNTER — Encounter: Payer: Self-pay | Admitting: Cardiovascular Disease

## 2022-02-22 ENCOUNTER — Ambulatory Visit (INDEPENDENT_AMBULATORY_CARE_PROVIDER_SITE_OTHER): Payer: Medicare Other | Admitting: Internal Medicine

## 2022-02-22 ENCOUNTER — Other Ambulatory Visit: Payer: Self-pay | Admitting: Internal Medicine

## 2022-02-22 ENCOUNTER — Encounter: Payer: Self-pay | Admitting: Internal Medicine

## 2022-02-22 VITALS — BP 126/68 | HR 78 | Temp 98.2°F | Resp 16 | Ht 61.0 in | Wt 175.2 lb

## 2022-02-22 DIAGNOSIS — E1159 Type 2 diabetes mellitus with other circulatory complications: Secondary | ICD-10-CM | POA: Diagnosis not present

## 2022-02-22 DIAGNOSIS — E039 Hypothyroidism, unspecified: Secondary | ICD-10-CM | POA: Diagnosis not present

## 2022-02-22 DIAGNOSIS — Z794 Long term (current) use of insulin: Secondary | ICD-10-CM

## 2022-02-22 DIAGNOSIS — E119 Type 2 diabetes mellitus without complications: Secondary | ICD-10-CM | POA: Diagnosis not present

## 2022-02-22 DIAGNOSIS — I152 Hypertension secondary to endocrine disorders: Secondary | ICD-10-CM | POA: Diagnosis not present

## 2022-02-22 DIAGNOSIS — E1151 Type 2 diabetes mellitus with diabetic peripheral angiopathy without gangrene: Secondary | ICD-10-CM

## 2022-02-22 LAB — BASIC METABOLIC PANEL
BUN: 22 mg/dL (ref 6–23)
CO2: 26 mEq/L (ref 19–32)
Calcium: 10.1 mg/dL (ref 8.4–10.5)
Chloride: 105 mEq/L (ref 96–112)
Creatinine, Ser: 0.86 mg/dL (ref 0.40–1.20)
GFR: 66.67 mL/min (ref >60.00)
Glucose, Bld: 126 mg/dL — ABNORMAL HIGH (ref 70–99)
Potassium: 4.3 mEq/L (ref 3.5–5.1)
Sodium: 140 mEq/L (ref 135–145)

## 2022-02-22 LAB — TSH: TSH: 2.25 u[IU]/mL (ref 0.35–5.50)

## 2022-02-22 MED ORDER — RYBELSUS 3 MG PO TABS: 3.0000 mg | ORAL_TABLET | Freq: Every day | ORAL | 0 refills | Status: DC

## 2022-02-22 NOTE — Progress Notes (Signed)
Subjective:  Patient ID: Kristen [name], female    DOB: 22-Dec-1945  Age: 76 y.o. MRN: [id number]  CC: Hypertension and Diabetes   HPI Kristen [name] presents for  f/up -  She is concerned that her blood sugars are not well-controlled.  She wants me to examine her feet.  She denies chest pain, shortness of breath, diaphoresis, edema, or polys.  Outpatient Medications Prior to Visit  Medication Sig Dispense Refill   ALPRAZolam (XANAX) 1 MG tablet TAKE ONE TABLET BY MOUTH EVERY MORNING, TAKE 1/2 TABLET midday, AND TAKE 1/2 TABLET AT BEDTIME 180 tablet 0   amoxicillin-clavulanate (AUGMENTIN) 875-125 MG tablet SMARTSIG:1 Tablet(s) By Mouth Every 12 Hours     atorvastatin (LIPITOR) 40 MG tablet TAKE ONE TABLET BY MOUTH ONCE DAILY 90 tablet 1   Blood Glucose Monitoring Suppl (ONE TOUCH ULTRA 2) w/Device KIT Inject 1 Act into the skin 3 (three) times daily. Use TID 1 kit 2   COMFORT EZ PEN NEEDLES 31G X 8 MM MISC USE WITH insulin AS DIRECTED 100 each 3   Continuous Blood Gluc Receiver (DEXCOM G7 RECEIVER) DEVI 1 Act by Does not apply route daily. 9 each 1   Continuous Blood Gluc Sensor (DEXCOM G7 SENSOR) MISC 1 Act by Does not apply route daily. 9 each 1   cyclobenzaprine (FLEXERIL) 10 MG tablet Take 10 mg by mouth 3 (three) times daily as needed.     diclofenac Sodium (VOLTAREN) 1 % GEL Apply 2 g topically 4 (four) times daily. (Patient taking differently: Apply 2 g topically daily as needed (Knees).) 100 g 0   furosemide (LASIX) 20 MG tablet Take 1 tablet (20 mg total) by mouth daily. 60 tablet 3   gabapentin (NEURONTIN) 300 MG capsule TAKE ONE CAPSULE BY MOUTH THREE TIMES DAILY 270 capsule 1   glucose blood (COOL BLOOD GLUCOSE TEST STRIPS) test strip 1 each by Other route 3 (three) times daily. Use to test blood sugar TID. DX: E11.9 300 each 1   insulin glargine, 2 Unit Dial, (TOUJEO MAX SOLOSTAR) 300 UNIT/ML Solostar Pen Inject 40 Units into the skin daily. 12 mL 1    JARDIANCE 25 MG TABS tablet Take 1 tablet (25 mg total) by mouth daily. Via BI CARES pt assistance 90 tablet 1   Lancets (ONETOUCH ULTRASOFT) lancets Use as instructed 100 each 12   magnesium oxide (MAG-OX) 400 (240 Mg) MG tablet Take 1 tablet (400 mg total) by mouth 2 (two) times daily. Take in the morning and evening. 180 tablet 3   meclizine (ANTIVERT) 25 MG tablet Take 1 tablet (25 mg total) by mouth 3 (three) times daily as needed for dizziness. 270 tablet 0   metFORMIN (GLUCOPHAGE-XR) 500 MG 24 hr tablet TAKE 3 TABLETS(1500 MG) BY MOUTH DAILY WITH BREAKFAST 270 tablet 1   metoprolol succinate (TOPROL-XL) 100 MG 24 hr tablet TAKE ONE TABLET BY MOUTH EVERY MORNING and TAKE ONE TABLET BY MOUTH EVERYDAY AT BEDTIME 180 tablet 2   nitroGLYCERIN (NITROSTAT) 0.4 MG SL tablet Place 1 tablet (0.4 mg total) under the tongue every 5 (five) minutes as needed for chest pain (x 3 doses). Reported on 05/08/2015 35 tablet 2   oxyCODONE-acetaminophen (PERCOCET) 10-325 MG tablet Take 1 tablet by mouth every 4 (four) hours as needed for pain.     polyethylene glycol (MIRALAX / GLYCOLAX) 17 g packet Take 17 g by mouth daily. 14 each 0   potassium chloride SA (KLOR-CON M) 20 MEQ  tablet TAKE ONE TABLET BY MOUTH EVERY MORNING and TAKE ONE TABLET BY MOUTH EVERYDAY AT BEDTIME 180 tablet 2   sacubitril-valsartan (ENTRESTO) 97-103 MG Take 1 tablet by mouth 2 (two) times daily. 180 tablet 3   spironolactone (ALDACTONE) 25 MG tablet TAKE ONE TABLET BY MOUTH ONCE DAILY 90 tablet 1   thiamine (VITAMIN B1) 100 MG tablet TAKE ONE TABLET BY MOUTH ONCE DAILY 90 tablet 1   warfarin (COUMADIN) 3 MG tablet TAKE ONE TABLET BY MOUTH DAILY EXCEPT TAKE 1 1/2 TABLETS ON MONDAYS OR AS DIRECTED BY ANTICOAGULATION CLINIC. 110 tablet 1   No facility-administered medications prior to visit.    ROS Review of Systems  Constitutional:  Positive for unexpected weight change (wt gain). Negative for diaphoresis and fatigue.  HENT: Negative.     Eyes: Negative.   Respiratory:  Negative for cough, chest tightness, shortness of breath and wheezing.   Cardiovascular:  Negative for chest pain, palpitations and leg swelling.  Gastrointestinal:  Negative for abdominal pain, diarrhea, nausea and vomiting.  Endocrine: Negative.   Genitourinary: Negative.  Negative for difficulty urinating.  Musculoskeletal:  Positive for back pain. Negative for arthralgias and myalgias.  Skin: Negative.   Neurological: Negative.  Negative for dizziness and weakness.  Hematological:  Negative for adenopathy. Does not bruise/bleed easily.  Psychiatric/Behavioral: Negative.      Objective:  BP 118/64 (BP Location: Left Arm, Patient Position: Sitting, Cuff Size: Normal)   Pulse 78   Temp 98.2 F (36.8 C) (Oral)   Ht _0  (1.549 m)   Wt 175 lb 3.2 oz (79.5 kg)   SpO2 96%   BMI 33.10 kg/m   BP Readings from Last 3 Encounters:  02/22/22 118/64  02/16/22 124/70  11/30/21 118/76    Wt Readings from Last 3 Encounters:  02/22/22 175 lb 3.2 oz (79.5 kg)  02/16/22 174 lb 6.4 oz (79.1 kg)  11/30/21 162 lb (73.5 kg)    Physical Exam Vitals reviewed.  Constitutional:      Appearance: She is obese. She is not ill-appearing.  HENT:     Mouth/Throat:     Mouth: Mucous membranes are moist.  Eyes:     General: No scleral icterus.    Conjunctiva/sclera: Conjunctivae normal.  Cardiovascular:     Rate and Rhythm: Normal rate and regular rhythm.     Heart sounds: No murmur heard.    No gallop.  Pulmonary:     Effort: Pulmonary effort is normal.     Breath sounds: No stridor. No wheezing, rhonchi or rales.  Abdominal:     General: Abdomen is protuberant. There is no distension.     Palpations: There is no mass.     Tenderness: There is no abdominal tenderness. There is no guarding.     Hernia: No hernia is present.  Musculoskeletal:        General: Normal range of motion.     Cervical back: Neck supple.     Right lower leg: No edema.     Left  lower leg: No edema.  Lymphadenopathy:     Cervical: No cervical adenopathy.  Skin:    General: Skin is warm and dry.  Neurological:     General: No focal deficit present.     Mental Status: She is alert.  Psychiatric:        Mood and Affect: Mood normal.        Behavior: Behavior normal.     Lab Results  Component Value Date  WBC 5.7 11/01/2021   HGB 12.2 11/22/2021   HGB 12.2 11/22/2021   HCT 36.0 11/22/2021   HCT 36.0 11/22/2021   PLT 309 11/01/2021   GLUCOSE 126 (H) 02/22/2022   CHOL 105 11/16/2021   TRIG 90.0 11/16/2021   HDL 41.00 11/16/2021   LDLDIRECT 116.0 11/28/2008   LDLCALC 46 11/16/2021   ALT 9 11/01/2021   AST 13 11/01/2021   NA 140 02/22/2022   K 4.3 02/22/2022   CL 105 02/22/2022   CREATININE 0.86 02/22/2022   BUN 22 02/22/2022   CO2 26 02/22/2022   TSH 2.25 02/22/2022   INR 2.8 02/08/2022   HGBA1C 8.8 (H) 02/22/2022   MICROALBUR <0.7 11/16/2021    CARDIAC CATHETERIZATION  Result Date: 11/22/2021 1.  Patent coronary arteries with mild nonobstructive plaquing of the LAD, wide patency of the left main, circumflex, and RCA. 2.  Patent but atretic LIMA graft due to competitive flow through an LAD that has no high-grade stenosis 3.  Low normal LV and RV filling pressures, preserved cardiac output, and no evidence of pulmonary hypertension Recommend medical therapy for nonobstructive CAD and chronic systolic heart failure with LV dysfunction.  The patient's filling pressures are all in the low normal range and she appears to be very well compensated at present.    Assessment & Plan:   Trenna was seen today for hypertension and diabetes.  Diagnoses and all orders for this visit:  Acquired hypothyroidism- She is euthyroid. -     TSH; Future -     TSH  Insulin-requiring or dependent type II diabetes mellitus ([place]) -     Hemoglobin A1c; Future -     Basic metabolic panel; Future -     Basic metabolic panel -     Hemoglobin A1c  Hypertension  associated with diabetes ([place])- Her blood pressure is very well-controlled. -     Basic metabolic panel; Future -     Basic metabolic panel  Type 2 diabetes mellitus without complication, with long-term current use of insulin ([place])- Her A1c is up to 8.8%.  Will add a GLP-1 agonist to her current regimen. -     Semaglutide (RYBELSUS) 3 MG TABS; Take 3 mg by mouth daily.   I am having [name] Dick "Fraser Din" start on Rybelsus. I am also having her maintain her onetouch ultrasoft, magnesium oxide, ONE TOUCH ULTRA 2, Cool Blood Glucose Test Strips, meclizine, oxyCODONE-acetaminophen, polyethylene glycol, diclofenac Sodium, furosemide, Entresto, nitroGLYCERIN, metoprolol succinate, Jardiance, spironolactone, warfarin, Toujeo Max SoloStar, Dexcom G7 Receiver, Dexcom G7 Sensor, thiamine, metFORMIN, potassium chloride SA, gabapentin, atorvastatin, ALPRAZolam, Comfort EZ Pen Needles, amoxicillin-clavulanate, and cyclobenzaprine.  Meds ordered this encounter  Medications   Semaglutide (RYBELSUS) 3 MG TABS    Sig: Take 3 mg by mouth daily.    Dispense:  30 tablet    Refill:  0     Follow-up: Return in about 6 months (around 08/24/2022).  Scarlette Calico, MD

## 2022-02-22 NOTE — Patient Instructions (Signed)

## 2022-02-23 LAB — HEMOGLOBIN A1C: Hgb A1c MFr Bld: 8.8 % — ABNORMAL HIGH (ref 4.6–6.5)

## 2022-03-01 DIAGNOSIS — Z79891 Long term (current) use of opiate analgesic: Secondary | ICD-10-CM | POA: Diagnosis not present

## 2022-03-01 DIAGNOSIS — M47816 Spondylosis without myelopathy or radiculopathy, lumbar region: Secondary | ICD-10-CM | POA: Diagnosis not present

## 2022-03-01 DIAGNOSIS — M150 Primary generalized (osteo)arthritis: Secondary | ICD-10-CM | POA: Diagnosis not present

## 2022-03-01 DIAGNOSIS — G894 Chronic pain syndrome: Secondary | ICD-10-CM | POA: Diagnosis not present

## 2022-03-02 ENCOUNTER — Ambulatory Visit: Payer: Medicare Other | Admitting: Podiatry

## 2022-03-02 DIAGNOSIS — M792 Neuralgia and neuritis, unspecified: Secondary | ICD-10-CM | POA: Diagnosis not present

## 2022-03-02 DIAGNOSIS — Z794 Long term (current) use of insulin: Secondary | ICD-10-CM

## 2022-03-02 DIAGNOSIS — E119 Type 2 diabetes mellitus without complications: Secondary | ICD-10-CM | POA: Diagnosis not present

## 2022-03-02 MED ORDER — PREGABALIN 100 MG PO CAPS: 100.0000 mg | ORAL_CAPSULE | Freq: Two times a day (BID) | ORAL | 0 refills | Status: DC

## 2022-03-02 NOTE — Progress Notes (Signed)
Subjective:  Patient ID: Kristen [name], female    DOB: 08-27-1945,  MRN: [id number]  Chief Complaint  Patient presents with   Diabetes    Pt stated that her feet feel strange     76 y.o. female presents with the above complaint.  Patient is experiencing neuropathic pain to both feet.  Patient states that it feels like there is something crawling on her foot.  She has a strange feeling.  She wanted to get it evaluated.  She is a diabetic with last A1c of 8.8.  She has not seen anyone as prior to seeing me denies any other acute complaints.  Would like to discuss treatment options she is on gabapentin   Review of Systems: Negative except as noted in the HPI. Denies N/V/F/Ch.  Past Medical History:  Diagnosis Date   Arthritis    Blood transfusion without reported diagnosis    Bursitis    CAD (coronary artery disease)    a. s/p CABG 2004.b. stable cath 2014 demonstrating stable CAD and continued patency of her LIMA graft.   Chronic anticoagulation    on coumadin   DDD (degenerative disc disease), lumbar    Depression    Diabetes mellitus    Diastolic dysfunction    per echo in October 2012 with EF 50 to 55%   Fibromyalgia    GERD (gastroesophageal reflux disease) 10/23/2003   Headache(784.0)    Hyperlipidemia    Hypertension    Hypokalemia    LBBB (left bundle branch block)    Lumbar back pain    LV dysfunction    a. EF 45% by cath 2014. b. EF 50-55% by technically difficult echo in 08/2014.   Lymphadenitis    Morbid obesity ([place])    a. Sleep study negative for significant OSA in 11/2014.   PAF (paroxysmal atrial fibrillation) ([place])    Stroke ([place]) 2004   affected speech per pt    Current Outpatient Medications:    pregabalin (LYRICA) 100 MG capsule, Take 1 capsule (100 mg total) by mouth 2 (two) times daily., Disp: 60 capsule, Rfl: 0   ALPRAZolam (XANAX) 1 MG tablet, TAKE ONE TABLET BY MOUTH EVERY MORNING, TAKE 1/2 TABLET midday, AND TAKE 1/2 TABLET AT BEDTIME,  Disp: 180 tablet, Rfl: 0   amoxicillin-clavulanate (AUGMENTIN) 875-125 MG tablet, SMARTSIG:1 Tablet(s) By Mouth Every 12 Hours, Disp: , Rfl:    atorvastatin (LIPITOR) 40 MG tablet, TAKE ONE TABLET BY MOUTH ONCE DAILY, Disp: 90 tablet, Rfl: 1   Blood Glucose Monitoring Suppl (ONE TOUCH ULTRA 2) w/Device KIT, Inject 1 Act into the skin 3 (three) times daily. Use TID, Disp: 1 kit, Rfl: 2   COMFORT EZ PEN NEEDLES 31G X 8 MM MISC, USE WITH insulin AS DIRECTED, Disp: 100 each, Rfl: 3   Continuous Blood Gluc Receiver ([place]) DEVI, 1 Act by Does not apply route daily., Disp: 9 each, Rfl: 1   Continuous Blood Gluc Sensor (DEXCOM G7 SENSOR) MISC, 1 Act by Does not apply route daily., Disp: 9 each, Rfl: 1   cyclobenzaprine (FLEXERIL) 10 MG tablet, Take 10 mg by mouth 3 (three) times daily as needed., Disp: , Rfl:    diclofenac Sodium (VOLTAREN) 1 % GEL, Apply 2 g topically 4 (four) times daily. (Patient taking differently: Apply 2 g topically daily as needed (Knees).), Disp: 100 g, Rfl: 0   furosemide (LASIX) 20 MG tablet, Take 1 tablet (20 mg total) by mouth daily., Disp: 60 tablet, Rfl: 3  gabapentin (NEURONTIN) 300 MG capsule, TAKE ONE CAPSULE BY MOUTH THREE TIMES DAILY, Disp: 270 capsule, Rfl: 1   glucose blood (COOL BLOOD GLUCOSE TEST STRIPS) test strip, 1 each by Other route 3 (three) times daily. Use to test blood sugar TID. DX: E11.9, Disp: 300 each, Rfl: 1   insulin glargine, 2 Unit Dial, (TOUJEO MAX SOLOSTAR) 300 UNIT/ML Solostar Pen, Inject 40 Units into the skin daily., Disp: 12 mL, Rfl: 1   JARDIANCE 25 MG TABS tablet, Take 1 tablet (25 mg total) by mouth daily. Via BI CARES pt assistance, Disp: 90 tablet, Rfl: 1   Lancets (ONETOUCH ULTRASOFT) lancets, Use as instructed, Disp: 100 each, Rfl: 12   magnesium oxide (MAG-OX) 400 (240 Mg) MG tablet, Take 1 tablet (400 mg total) by mouth 2 (two) times daily. Take in the morning and evening., Disp: 180 tablet, Rfl: 3   meclizine (ANTIVERT)  25 MG tablet, Take 1 tablet (25 mg total) by mouth 3 (three) times daily as needed for dizziness., Disp: 270 tablet, Rfl: 0   metFORMIN (GLUCOPHAGE-XR) 500 MG 24 hr tablet, TAKE 3 TABLETS(1500 MG) BY MOUTH DAILY WITH BREAKFAST, Disp: 270 tablet, Rfl: 1   metoprolol succinate (TOPROL-XL) 100 MG 24 hr tablet, TAKE ONE TABLET BY MOUTH EVERY MORNING and TAKE ONE TABLET BY MOUTH EVERYDAY AT BEDTIME, Disp: 180 tablet, Rfl: 2   nitroGLYCERIN (NITROSTAT) 0.4 MG SL tablet, Place 1 tablet (0.4 mg total) under the tongue every 5 (five) minutes as needed for chest pain (x 3 doses). Reported on 05/08/2015, Disp: 35 tablet, Rfl: 2   oxyCODONE-acetaminophen (PERCOCET) 10-325 MG tablet, Take 1 tablet by mouth every 4 (four) hours as needed for pain., Disp: , Rfl:    polyethylene glycol (MIRALAX / GLYCOLAX) 17 g packet, Take 17 g by mouth daily., Disp: 14 each, Rfl: 0   potassium chloride SA (KLOR-CON M) 20 MEQ tablet, TAKE ONE TABLET BY MOUTH EVERY MORNING and TAKE ONE TABLET BY MOUTH EVERYDAY AT BEDTIME, Disp: 180 tablet, Rfl: 2   sacubitril-valsartan (ENTRESTO) 97-103 MG, Take 1 tablet by mouth 2 (two) times daily., Disp: 180 tablet, Rfl: 3   Semaglutide (RYBELSUS) 3 MG TABS, Take 3 mg by mouth daily., Disp: 30 tablet, Rfl: 0   spironolactone (ALDACTONE) 25 MG tablet, TAKE ONE TABLET BY MOUTH ONCE DAILY, Disp: 90 tablet, Rfl: 1   thiamine (VITAMIN B1) 100 MG tablet, TAKE ONE TABLET BY MOUTH ONCE DAILY, Disp: 90 tablet, Rfl: 1   warfarin (COUMADIN) 3 MG tablet, TAKE ONE TABLET BY MOUTH DAILY EXCEPT TAKE 1 1/2 TABLETS ON MONDAYS OR AS DIRECTED BY ANTICOAGULATION CLINIC., Disp: 110 tablet, Rfl: 1  Social History   Tobacco Use  Smoking Status Former   Types: Cigarettes   Quit date: 03/07/2001   Years since quitting: 21.0   Passive exposure: Current  Smokeless Tobacco Never    Allergies  Allergen Reactions   Metformin And Related Other (See Comments)    Must take XR form only, cannot tolerate Regular release  metformin   Cleocin [Clindamycin Hcl] Diarrhea   Codeine Itching   Doxycycline Hyclate Diarrhea   Macrolides And Ketolides Diarrhea   Morphine Hives   Pentazocine Lactate Itching and Nausea And Vomiting    (GENERIC- [place])   Vibramycin [Doxycycline Calcium] Diarrhea and Rash   Clindamycin Diarrhea   Definity [Perflutren Lipid Microsphere] Other (See Comments)    Patient complained of warm sensation in chest x few seconds duration when injected Definity.No other symptoms   Doxycycline  Diarrhea   Metformin Diarrhea    Must take long acting can not take regular   Pentazocine Nausea Only   Sulfa Antibiotics Diarrhea and Rash   Sulfonamide Derivatives Rash   Objective:  There were no vitals filed for this visit. There is no height or weight on file to calculate BMI. Constitutional Well developed. Well nourished.  Vascular Dorsalis pedis pulses palpable bilaterally. Posterior tibial pulses palpable bilaterally. Capillary refill normal to all digits.  No cyanosis or clubbing noted. Pedal hair growth normal.  Neurologic Normal speech. Oriented to person, place, and time. Decreased ensation to light touch grossly present bilaterally.  Protective sensation is intact.  However there is subjective neuropathic pain that patient is experiencing  Dermatologic Thickened elongated dystrophic toenails x 10.  Orthopedic: Normal joint ROM without pain or crepitus bilaterally. No visible deformities. No bony tenderness.   Radiographs: None Assessment:   1. Neuropathic pain   2. Type 2 diabetes mellitus without complication, with long-term current use of insulin ([place])    Plan:  Patient was evaluated and treated and all questions answered.  Neuropathic pain secondary to diabetes -All questions and concerns were discussed with the patient in extensive detail.  It seems like the gabapentin may not be helping the patient I believe she will benefit from Lyrica.  Lyrica was sent to the pharmacy.   I have asked her that she can take it up to 2 times a day if needed.  She states understanding  No follow-ups on file.

## 2022-03-02 NOTE — Addendum Note (Signed)
Addended by: Boneta Lucks on: 03/02/2022 02:55 PM   Modules accepted: Level of Service

## 2022-03-15 ENCOUNTER — Telehealth: Payer: Self-pay

## 2022-03-15 ENCOUNTER — Ambulatory Visit: Payer: Medicare Other

## 2022-03-15 NOTE — Telephone Encounter (Signed)
Pt called to [facility] coumadin clinic apt due to weather. Pt willing to go to [place] office. She will be out of town the rest of the week. [facility]/15 [facility].   Pt also reported she will have a tooth extraction tomorrow but the dentist is not recommending any hold on warfarin. Advised if any problems with bleeding after extraction to contact dentist and coumadin clinic and to go to ER if needed. Pt verbalized understanding.

## 2022-03-21 ENCOUNTER — Ambulatory Visit (INDEPENDENT_AMBULATORY_CARE_PROVIDER_SITE_OTHER): Payer: Medicare Other

## 2022-03-21 DIAGNOSIS — D510 Vitamin B12 deficiency anemia due to intrinsic factor deficiency: Secondary | ICD-10-CM

## 2022-03-21 DIAGNOSIS — Z7901 Long term (current) use of anticoagulants: Secondary | ICD-10-CM | POA: Diagnosis not present

## 2022-03-21 LAB — POCT INR: INR: 2.2 (ref 2.0–3.0)

## 2022-03-21 MED ORDER — CYANOCOBALAMIN 1000 MCG/ML IJ SOLN
1000.0000 ug | Freq: Once | INTRAMUSCULAR | Status: AC
  Administered 2022-03-21: 1000 ug via INTRAMUSCULAR

## 2022-03-21 NOTE — Progress Notes (Signed)
Continue to take 1 tablet daily except take 1 1/2 tablets on Mondays, Wednesdays, and Fridays. Recheck in 6 weeks.  Vitamin B 12 1,000 mcg also administered this visit in pt's left deltoid. Injection was due, last B 12 lab was in May 2023. Pt tolerated well.

## 2022-03-21 NOTE — Patient Instructions (Addendum)
Pre visit review using our clinic review tool, if applicable. No additional management support is needed unless otherwise documented below in the visit note.  Continue to take 1 tablet daily except take 1 1/2 tablets on Mondays, Wednesdays, and Fridays. Recheck in 6 weeks.

## 2022-03-24 ENCOUNTER — Ambulatory Visit: Payer: Medicare Other | Admitting: Internal Medicine

## 2022-03-28 DIAGNOSIS — L650 Telogen effluvium: Secondary | ICD-10-CM | POA: Diagnosis not present

## 2022-03-28 DIAGNOSIS — L821 Other seborrheic keratosis: Secondary | ICD-10-CM | POA: Diagnosis not present

## 2022-03-29 ENCOUNTER — Telehealth: Payer: Self-pay | Admitting: Home Health

## 2022-03-29 ENCOUNTER — Ambulatory Visit: Payer: Medicare Other | Admitting: Internal Medicine

## 2022-03-29 NOTE — Telephone Encounter (Signed)
Patient called after hour line today reporting she had lightheadedness for 10 minutes  about 45 minutes ago, BP 185/73, AP 81, she re-checked her VS later at 457pm 129/69, AP was 70. She is very slow with speech, forgetful, have difficulty with speech since her stroke. She states she took xanax, it helped calm her down. She states she did not have any chest pain, SOB. Patient feels well now. Advised the patient to trend BP, monitor for symptoms, call back if BP trend seems elevated persistently, go to ER if having persistent SOB, chest pain, dizziness, syncope.

## 2022-04-01 ENCOUNTER — Telehealth: Payer: Self-pay | Admitting: Internal Medicine

## 2022-04-01 DIAGNOSIS — Z794 Long term (current) use of insulin: Secondary | ICD-10-CM

## 2022-04-01 MED ORDER — RYBELSUS 3 MG PO TABS: 3.0000 mg | ORAL_TABLET | Freq: Every day | ORAL | 1 refills | Status: DC

## 2022-04-01 NOTE — Telephone Encounter (Signed)
Patient called and stated she was out of the medication below. She said she sent a request through [facility] a few days ago.    MEDICATION:Semaglutide (RYBELSUS) 3 MG TABS   PHARMACY:[facility] - [place], [place] - [address]  [address], [place] [address]   Comments:   **Let patient know to contact pharmacy at the end of the day to make sure medication is ready. **  ** Please notify patient to allow 48-72 hours to process**  **Encourage patient to contact the pharmacy for refills or they can request refills through [facility]**

## 2022-04-01 NOTE — Telephone Encounter (Signed)
Per pt's chart no MyChart was received.   Rx refill has been sent to her pharmacy.

## 2022-04-04 DIAGNOSIS — M150 Primary generalized (osteo)arthritis: Secondary | ICD-10-CM | POA: Diagnosis not present

## 2022-04-04 DIAGNOSIS — M47816 Spondylosis without myelopathy or radiculopathy, lumbar region: Secondary | ICD-10-CM | POA: Diagnosis not present

## 2022-04-04 DIAGNOSIS — G894 Chronic pain syndrome: Secondary | ICD-10-CM | POA: Diagnosis not present

## 2022-04-04 DIAGNOSIS — Z79891 Long term (current) use of opiate analgesic: Secondary | ICD-10-CM | POA: Diagnosis not present

## 2022-04-06 ENCOUNTER — Other Ambulatory Visit: Payer: Self-pay | Admitting: Cardiovascular Disease

## 2022-04-06 ENCOUNTER — Other Ambulatory Visit: Payer: Self-pay | Admitting: Internal Medicine

## 2022-04-06 DIAGNOSIS — E1169 Type 2 diabetes mellitus with other specified complication: Secondary | ICD-10-CM

## 2022-04-06 DIAGNOSIS — I480 Paroxysmal atrial fibrillation: Secondary | ICD-10-CM

## 2022-04-06 DIAGNOSIS — I5042 Chronic combined systolic (congestive) and diastolic (congestive) heart failure: Secondary | ICD-10-CM

## 2022-04-25 ENCOUNTER — Other Ambulatory Visit: Payer: Self-pay | Admitting: Internal Medicine

## 2022-04-25 DIAGNOSIS — I5042 Chronic combined systolic (congestive) and diastolic (congestive) heart failure: Secondary | ICD-10-CM

## 2022-04-25 DIAGNOSIS — E1151 Type 2 diabetes mellitus with diabetic peripheral angiopathy without gangrene: Secondary | ICD-10-CM

## 2022-04-28 ENCOUNTER — Other Ambulatory Visit: Payer: Self-pay | Admitting: Internal Medicine

## 2022-04-28 DIAGNOSIS — F411 Generalized anxiety disorder: Secondary | ICD-10-CM

## 2022-04-28 NOTE — Telephone Encounter (Signed)
MD out of the office until Monday. Pls advise.Marland KitchenJohny Chess

## 2022-05-02 ENCOUNTER — Encounter: Payer: Self-pay | Admitting: Internal Medicine

## 2022-05-02 ENCOUNTER — Ambulatory Visit: Payer: Medicare Other | Admitting: Internal Medicine

## 2022-05-02 VITALS — BP 116/68 | HR 80 | Ht 60.0 in | Wt 179.2 lb

## 2022-05-02 DIAGNOSIS — K5900 Constipation, unspecified: Secondary | ICD-10-CM

## 2022-05-02 NOTE — Progress Notes (Signed)
HISTORY OF PRESENT ILLNESS:  Kristen [name] is a 77 y.o. female with multiple significant medical problems including coronary artery disease status post coronary artery bypass grafting, congestive heart failure with significant reduction in ejection fraction, atrial fibrillation on chronic anticoagulation, prior stroke, diabetes, hypertension, hyperlipidemia, obesity, and degenerative joint disease.  She presents today regarding constipation and concerns that she might have "bugs in her stool".  I last saw the patient January 2014 when she underwent routine screening colonoscopy.  The examination was normal (2 diminutive hyperplastic polyps removed).  She complained of irregular bowel habits at that time.  Metamucil was recommended.  Patient tells me that she has chronic constipation for many years.  She takes MiraLAX every other day or every third day, which works.  She tells me that she occasionally looks into the toilet and wonders if there is hair in her stool or possibly bugs.  She did have bedbugs.  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  She does have occasional gas.  She describes worsening congestion since she has had her teeth pulled.  Does not have dentures yet.  No weight loss.  No melena.  No hematochezia.  No family history of colon cancer.  CBC from August 2023 shows normal hemoglobin 12.3.  Abdominal films from May 2023 to evaluate abdominal distention and constipation revealed nonspecific bowel gas pattern.  CT scan of the abdomen and pelvis, also May 2023, revealed distended rectum with feces.  She is status post cholecystectomy and hysterectomy.  REVIEW OF SYSTEMS:  All non-GI ROS negative unless otherwise stated in the HPI except for sinus trouble, anxiety, arthritis, back pain, depression, fatigue, heart rhythm change, itching, shortness of breath, sleeping problems, urinary frequency, urinary leakage  Past Medical History:   Diagnosis Date   Arthritis    Blood transfusion without reported diagnosis    Bursitis    CAD (coronary artery disease)    a. s/p CABG 2004.b. stable cath 2014 demonstrating stable CAD and continued patency of her LIMA graft.   Chronic anticoagulation    on coumadin   DDD (degenerative disc disease), lumbar    Depression    Diabetes mellitus    Diastolic dysfunction    per echo in October 2012 with EF 50 to 55%   Fibromyalgia    GERD (gastroesophageal reflux disease) 10/23/2003   Headache(784.0)    Hyperlipidemia    Hypertension    Hypokalemia    LBBB (left bundle branch block)    Lumbar back pain    LV dysfunction    a. EF 45% by cath 2014. b. EF 50-55% by technically difficult echo in 08/2014.   Lymphadenitis    Morbid obesity ([place])    a. Sleep study negative for significant OSA in 11/2014.   PAF (paroxysmal atrial fibrillation) ([place])    Stroke ([place]) 2004   affected speech per pt    Past Surgical History:  Procedure Laterality Date   ABDOMINAL HYSTERECTOMY     ANGIOPLASTY  laminectomy   CHOLECYSTECTOMY     CORONARY ARTERY BYPASS GRAFT     LIMA to LAD    KNEE ARTHROSCOPY     LEFT HEART CATHETERIZATION WITH CORONARY/GRAFT ANGIOGRAM  12/07/2012   Procedure: LEFT HEART CATHETERIZATION WITH Beatrix Fetters;  Surgeon: Blane Ohara, MD;  Location: [facility] CATH LAB;  Service: Cardiovascular;;   LUMBAR LAMINECTOMY     x3   RIGHT/LEFT HEART CATH AND CORONARY/GRAFT ANGIOGRAPHY N/A 11/22/2021   Procedure: RIGHT/LEFT  HEART CATH AND CORONARY/GRAFT ANGIOGRAPHY;  Surgeon: Sherren Mocha, MD;  Location: [facility] CV LAB;  Service: Cardiovascular;  Laterality: N/A;   SPHINCTEROTOMY     TONSILLECTOMY      Social History [name]  reports that she quit smoking about 21 years ago. Her smoking use included cigarettes. She has been exposed to tobacco smoke. She has never used smokeless tobacco. She reports that she does not drink alcohol and does not use  drugs.  family history includes Anxiety disorder in her maternal aunt; Arthritis in an other family member; Colon cancer in her paternal uncle; Depression in her sister; Diabetes in an other family member; Heart attack in her father; Heart disease in her father; Hypertension in an other family member; Kidney disease in an other family member; Stroke in her mother and another family member.  Allergies  Allergen Reactions   Metformin And Related Other (See Comments)    Must take XR form only, cannot tolerate Regular release metformin   Cleocin [Clindamycin Hcl] Diarrhea   Codeine Itching   Doxycycline Hyclate Diarrhea   Macrolides And Ketolides Diarrhea   Morphine Hives   Pentazocine Lactate Itching and Nausea And Vomiting    (GENERIC- [place])   Vibramycin [Doxycycline Calcium] Diarrhea and Rash   Clindamycin Diarrhea   Definity [Perflutren Lipid Microsphere] Other (See Comments)    Patient complained of warm sensation in chest x few seconds duration when injected Definity.No other symptoms   Doxycycline Diarrhea   Metformin Diarrhea    Must take long acting can not take regular   Pentazocine Nausea Only   Sulfa Antibiotics Diarrhea and Rash   Sulfonamide Derivatives Rash       PHYSICAL EXAMINATION: Vital signs: Ht 5' (1.524 m) Comment: height measured without shoes  Wt 179 lb 4 oz (81.3 kg)   BMI 35.01 kg/m   Constitutional: Chronically ill-appearing female, no acute distress.  Moves slowly with cane Psychiatric: alert and oriented x3, cooperative Eyes: extraocular movements intact, anicteric, conjunctiva pink Mouth: oral pharynx moist, no lesions Neck: supple no lymphadenopathy Cardiovascular: heart regular rate and rhythm, no murmur Lungs: clear to auscultation bilaterally Abdomen: soft, obese, nontender, nondistended, no obvious ascites, no peritoneal signs, normal bowel sounds, no organomegaly Rectal: Omitted Extremities: no clubbing or cyanosis.  Trace lower extremity  edema bilaterally Skin: no lesions on visible extremities Neuro: No focal deficits.  Cranial nerves intact  ASSESSMENT:  1.  Chronic constipation 2.  Concerns over bugs in her stool 3.  Normal colonoscopy 2014 4.  Multiple significant medical problems and advanced comorbidities   PLAN:  1.  Continue to use MiraLAX with what ever frequency achieve desired effect.  We reviewed this together today. 2.  This patient is not a candidate for colonoscopy given her age and comorbidities 3.  Resume general medical care with PCP 4.  Reassurance regarding bugs in her stool A total time of 45 minutes was spent during see the patient, reviewing the myriad of data, obtaining comprehensive history, performing medically appropriate physical examination, counseling and educating the patient regarding the above listed issues, and documenting clinical information in the health record

## 2022-05-02 NOTE — Patient Instructions (Signed)
_______________________________________________________  If your blood pressure at your visit was 140/90 or greater, please contact your primary care physician to follow up on this.  _______________________________________________________  If you are age 75 or older, your body mass index should be between 23-30. Your Body mass index is 35.01 kg/m. If this is out of the aforementioned range listed, please consider follow up with your Primary Care Provider.  If you are age 75 or younger, your body mass index should be between 19-25. Your Body mass index is 35.01 kg/m. If this is out of the aformentioned range listed, please consider follow up with your Primary Care Provider.   ________________________________________________________  The [facility] providers would like to encourage you to use [facility] to communicate with providers for non-urgent requests or questions.  Due to long hold times on the telephone, sending your provider a message by [facility] may be a faster and more efficient way to get a response.  Please allow 48 business hours for a response.  Please remember that this is for non-urgent requests.  _______________________________________________________  Continue Miralax and Follow up as needed.

## 2022-05-03 DIAGNOSIS — Z79891 Long term (current) use of opiate analgesic: Secondary | ICD-10-CM | POA: Diagnosis not present

## 2022-05-03 DIAGNOSIS — G894 Chronic pain syndrome: Secondary | ICD-10-CM | POA: Diagnosis not present

## 2022-05-03 DIAGNOSIS — M150 Primary generalized (osteo)arthritis: Secondary | ICD-10-CM | POA: Diagnosis not present

## 2022-05-03 DIAGNOSIS — M47816 Spondylosis without myelopathy or radiculopathy, lumbar region: Secondary | ICD-10-CM | POA: Diagnosis not present

## 2022-05-06 ENCOUNTER — Telehealth: Payer: Self-pay

## 2022-05-06 ENCOUNTER — Ambulatory Visit (INDEPENDENT_AMBULATORY_CARE_PROVIDER_SITE_OTHER): Payer: Medicare Other

## 2022-05-06 DIAGNOSIS — D510 Vitamin B12 deficiency anemia due to intrinsic factor deficiency: Secondary | ICD-10-CM | POA: Diagnosis not present

## 2022-05-06 DIAGNOSIS — Z7901 Long term (current) use of anticoagulants: Secondary | ICD-10-CM

## 2022-05-06 LAB — POCT INR: INR: 4.5 — AB (ref 2.0–3.0)

## 2022-05-06 MED ORDER — CYANOCOBALAMIN 1000 MCG/ML IJ SOLN
1000.0000 ug | Freq: Once | INTRAMUSCULAR | Status: AC
  Administered 2022-05-06: 1000 ug via INTRAMUSCULAR

## 2022-05-06 NOTE — Telephone Encounter (Signed)
Pt in office today for INR check and reports she needs financial assistance with Rybelsus. She is already receiving manufacturer assistance for other medication and thinks she will qualify. Advised pt a msg would be sent to PCP and his CMA to request they start paperwork for financial assistance. Pt appreciative and voiced she will bring her financial information by next week.

## 2022-05-06 NOTE — Patient Instructions (Addendum)
Pre visit review using our clinic review tool, if applicable. No additional management support is needed unless otherwise documented below in the visit note.  Hold dose today and hold dose tomorrow and then change weekly dose to take 1 tablet daily. Recheck in 2 weeks.

## 2022-05-06 NOTE — Progress Notes (Signed)
Hold dose today and hold dose tomorrow and then change weekly dose to take 1 tablet daily. Recheck in 2 weeks.  Vitamin B 12 1,000 mcg also administered this visit in pt's left deltoid. Injection was due, last B 12 lab was in May 2023. Pt tolerated well.

## 2022-05-09 ENCOUNTER — Other Ambulatory Visit (HOSPITAL_COMMUNITY): Payer: Self-pay

## 2022-05-11 ENCOUNTER — Telehealth: Payer: Self-pay | Admitting: Cardiovascular Disease

## 2022-05-11 ENCOUNTER — Other Ambulatory Visit: Payer: Self-pay

## 2022-05-11 MED ORDER — ENTRESTO 97-103 MG PO TABS: 1.0000 | ORAL_TABLET | Freq: Two times a day (BID) | ORAL | 3 refills | Status: DC

## 2022-05-11 NOTE — Telephone Encounter (Signed)
*  STAT* If patient is at the pharmacy, call can be transferred to refill team.   1. Which medications need to be refilled? (please list name of each medication and dose if known)  sacubitril-valsartan (ENTRESTO) 97-103 MG  2. Which pharmacy/location (including street and city if local pharmacy) is medication to be sent to? [employer] [facility]  3. Do they need a 30 day or 90 day supply?   Patient states [employer] is requesting a prescription in order to dispense the medication.

## 2022-05-11 NOTE — Telephone Encounter (Signed)
**Note De-Identified  Obfuscation** I called [facility] and was advised by Denton Ar that the pt has been approved for Entresto assistance until 03/07/2023 and that all they need is a new Entresto 97-103 mg RX and that e-scribing it to them is preferred.  I offered to complete a providers page of a [facility] application, have Dr Copper sign, date it, and fax it to them but Denton Ar stated that e-scribing is all they need.  Denton Ar did advise me that the pt would need to contact them to order her next refill.  The pt is advised that I have e-scribed her Entresto RX to [facility] (pharmacy for [facility]) for #180 with 3 refills and that per Brianna at [employer] she will need to contact them for her next refill.  She verbalized understanding and thanked me for my assistance.

## 2022-05-16 ENCOUNTER — Other Ambulatory Visit (HOSPITAL_COMMUNITY): Payer: Self-pay

## 2022-05-18 ENCOUNTER — Ambulatory Visit (HOSPITAL_COMMUNITY): Payer: Medicare Other | Attending: Cardiovascular Disease

## 2022-05-18 DIAGNOSIS — I480 Paroxysmal atrial fibrillation: Secondary | ICD-10-CM

## 2022-05-18 DIAGNOSIS — E782 Mixed hyperlipidemia: Secondary | ICD-10-CM | POA: Diagnosis not present

## 2022-05-18 DIAGNOSIS — I5042 Chronic combined systolic (congestive) and diastolic (congestive) heart failure: Secondary | ICD-10-CM

## 2022-05-18 DIAGNOSIS — I2510 Atherosclerotic heart disease of native coronary artery without angina pectoris: Secondary | ICD-10-CM | POA: Diagnosis not present

## 2022-05-18 LAB — ECHOCARDIOGRAM COMPLETE
Area-P 1/2: 4.77 cm2
S' Lateral: 2.3 cm

## 2022-05-20 ENCOUNTER — Ambulatory Visit (INDEPENDENT_AMBULATORY_CARE_PROVIDER_SITE_OTHER): Payer: Medicare Other

## 2022-05-20 DIAGNOSIS — Z7901 Long term (current) use of anticoagulants: Secondary | ICD-10-CM

## 2022-05-20 LAB — POCT INR: INR: 1.9 — AB (ref 2.0–3.0)

## 2022-05-20 NOTE — Patient Instructions (Addendum)
Pre visit review using our clinic review tool, if applicable. No additional management support is needed unless otherwise documented below in the visit note.  Increase dose today to take 1 1/2 tablets and the continue 1 tablet daily. Recheck in 3 weeks.

## 2022-05-20 NOTE — Progress Notes (Signed)
Increase dose today to take 1 1/2 tablets and the continue 1 tablet daily. Recheck in 3 weeks.

## 2022-05-20 NOTE — Telephone Encounter (Signed)
Pt brought finished paperwork for financial assistance for Rybelsus. Pt also brought income statements for herself and husband. Made copies of this information and gave pt copies and original income statements back.  Gave paperwork to [employer], PCP's CMA, to process as needed.

## 2022-05-23 DIAGNOSIS — N390 Urinary tract infection, site not specified: Secondary | ICD-10-CM | POA: Diagnosis not present

## 2022-05-23 DIAGNOSIS — R300 Dysuria: Secondary | ICD-10-CM | POA: Diagnosis not present

## 2022-05-23 NOTE — Progress Notes (Signed)
Office Visit    Patient Name: Kristen [name] Date of Encounter: 05/25/2022  Primary Care Provider:  [name], MD Primary Cardiologist:  Sherren Mocha, MD Primary Electrophysiologist: None  Chief Complaint    Kristen [name] is a 77 y.o. female with PMH of CAD s/p CABG in 2004, PAF (on warfarin), CVA, obesity, HTN, LBBB, chronic combined CHF, IDDM type II, HLD who presents today for 70-month follow-up.  Past Medical History    Past Medical History:  Diagnosis Date   Arthritis    Blood transfusion without reported diagnosis    Bursitis    CAD (coronary artery disease)    a. s/p CABG 2004.b. stable cath 2014 demonstrating stable CAD and continued patency of her LIMA graft.   Chronic anticoagulation    on coumadin   DDD (degenerative disc disease), lumbar    Depression    Diabetes mellitus    Diastolic dysfunction    per echo in October 2012 with EF 50 to 55%   Fibromyalgia    GERD (gastroesophageal reflux disease) 10/23/2003   Headache(784.0)    Hyperlipidemia    Hypertension    Hypokalemia    LBBB (left bundle branch block)    Lumbar back pain    LV dysfunction    a. EF 45% by cath 2014. b. EF 50-55% by technically difficult echo in 08/2014.   Lymphadenitis    Morbid obesity ([place])    a. Sleep study negative for significant OSA in 11/2014.   PAF (paroxysmal atrial fibrillation) ([place])    Stroke ([place]) 2004   affected speech per pt   Past Surgical History:  Procedure Laterality Date   ABDOMINAL HYSTERECTOMY     ANGIOPLASTY  laminectomy   CHOLECYSTECTOMY     CORONARY ARTERY BYPASS GRAFT     LIMA to LAD    KNEE ARTHROSCOPY     LEFT HEART CATHETERIZATION WITH CORONARY/GRAFT ANGIOGRAM  12/07/2012   Procedure: LEFT HEART CATHETERIZATION WITH Beatrix Fetters;  Surgeon: Blane Ohara, MD;  Location: [facility] CATH LAB;  Service: Cardiovascular;;   LUMBAR LAMINECTOMY     x3   RIGHT/LEFT HEART CATH AND CORONARY/GRAFT ANGIOGRAPHY N/A 11/22/2021    Procedure: RIGHT/LEFT HEART CATH AND CORONARY/GRAFT ANGIOGRAPHY;  Surgeon: Sherren Mocha, MD;  Location: [facility] CV LAB;  Service: Cardiovascular;  Laterality: N/A;   SPHINCTEROTOMY     TONSILLECTOMY      Allergies  Allergies  Allergen Reactions   Metformin And Related Other (See Comments)    Must take XR form only, cannot tolerate Regular release metformin   Cleocin [Clindamycin Hcl] Diarrhea   Codeine Itching   Doxycycline Hyclate Diarrhea   Macrolides And Ketolides Diarrhea   Morphine Hives   Pentazocine Lactate Itching and Nausea And Vomiting    (GENERIC- [place])   Vibramycin [Doxycycline Calcium] Diarrhea and Rash   Clindamycin Diarrhea   Definity [Perflutren Lipid Microsphere] Other (See Comments)    Patient complained of warm sensation in chest x few seconds duration when injected Definity.No other symptoms   Doxycycline Diarrhea   Metformin Diarrhea    Must take long acting can not take regular   Pentazocine Nausea Only   Sulfa Antibiotics Diarrhea and Rash   Sulfonamide Derivatives Rash    History of Present Illness    Kristen [name]  is a 77 year old female with the above mention past medical history who presents today for 73-month follow-up of coronary artery disease and HFrEF.  She has been followed  by Dr. Burt Knack since 2021 for management of coronary artery disease.  In 04/2021 she underwent a echocardiogram to evaluate LV function which demonstrated severe global LV dysfunction with EF of 25-30% down from 50-55% in 2016.  GDMT was titrated and she was referred to Pharm.D. for further titration.  She underwent left and right heart catheterizations to evaluate for decreased LV function. Her heart showed normal coronaries and heart pressures.  She was last seen 02/2022 with plan to refer to EP if LV was still decreased after 54-month check.  She had 2D echo rechecked 05/2022 that showed normal EF of 50-55% and patient was advised to continue current medical  therapy.  Kristen [name] presents today for 86-month follow-up alone.  Since last being seen in the office patient reports she has been doing well with no new cardiac complaints.  She is tolerating current medication regimen and reports no adverse side effects.  Her blood pressure today is well-controlled at 128/72 and heart rate was 84 bpm.  She is euvolemic on exam today however weight is increased slightly up 4 to 5 pounds since previous visit.  She is currently putting her Lasix on hold due to a UTI that she has been treated for.  I advised her to monitor her weight during this time and to abstain from excess salt and fluid.  She is needing bilateral knee replacement and also a shoulder replacement due to arthritis.  During visit we reviewed the results of her echocardiogram and also discussed the pathophysiology of CHF and LV dysfunction.  Patient denies chest pain, palpitations, dyspnea, PND, orthopnea, nausea, vomiting, dizziness, syncope, edema, weight gain, or early satiety.  Home Medications    Current Outpatient Medications  Medication Sig Dispense Refill   ALPRAZolam (XANAX) 1 MG tablet TAKE ONE TABLET BY MOUTH THREE TIMES DAILY AS NEEDED 270 tablet 0   amoxicillin-clavulanate (AUGMENTIN) 875-125 MG tablet Take 1 tablet by mouth 2 (two) times daily.     atorvastatin (LIPITOR) 40 MG tablet TAKE ONE TABLET BY MOUTH ONCE DAILY 90 tablet 1   COMFORT EZ PEN NEEDLES 31G X 8 MM MISC USE WITH insulin AS DIRECTED 100 each 3   Continuous Blood Gluc Receiver (DEXCOM G7 RECEIVER) DEVI 1 Act by Does not apply route daily. 9 each 1   Continuous Blood Gluc Sensor (DEXCOM G7 SENSOR) MISC 1 Act by Does not apply route daily. 9 each 1   cyclobenzaprine (FLEXERIL) 10 MG tablet Take 10 mg by mouth 3 (three) times daily as needed.     diclofenac Sodium (VOLTAREN) 1 % GEL Apply 2 g topically 4 (four) times daily. (Patient taking differently: Apply 2 g topically 4 (four) times daily. As needed) 100 g 0   gabapentin  (NEURONTIN) 300 MG capsule TAKE ONE CAPSULE BY MOUTH THREE TIMES DAILY 270 capsule 1   insulin glargine, 2 Unit Dial, (TOUJEO MAX SOLOSTAR) 300 UNIT/ML Solostar Pen Inject 40 Units into the skin daily. 12 mL 1   JARDIANCE 25 MG TABS tablet TAKE ONE TABLET BY MOUTH ONCE DAILY 90 tablet 1   magnesium oxide (MAG-OX) 400 MG tablet TAKE ONE TABLET BY MOUTH TWICE DAILY 180 tablet 3   metFORMIN (GLUCOPHAGE-XR) 500 MG 24 hr tablet TAKE 3 TABLETS(1500 MG) BY MOUTH DAILY WITH BREAKFAST 270 tablet 1   metoprolol succinate (TOPROL-XL) 100 MG 24 hr tablet TAKE ONE TABLET BY MOUTH EVERY MORNING and TAKE ONE TABLET BY MOUTH EVERYDAY AT BEDTIME 180 tablet 3   nitroGLYCERIN (NITROSTAT) 0.4 MG  SL tablet Place 1 tablet (0.4 mg total) under the tongue every 5 (five) minutes as needed for chest pain (x 3 doses). Reported on 05/08/2015 35 tablet 2   oxyCODONE-acetaminophen (PERCOCET) 10-325 MG tablet Take 1 tablet by mouth every 4 (four) hours as needed for pain.     polyethylene glycol (MIRALAX / GLYCOLAX) 17 g packet Take 17 g by mouth daily. (Patient taking differently: Take 17 g by mouth as needed.) 14 each 0   potassium chloride SA (KLOR-CON M) 20 MEQ tablet TAKE ONE TABLET BY MOUTH EVERY MORNING and TAKE ONE TABLET BY MOUTH EVERYDAY AT BEDTIME 180 tablet 2   sacubitril-valsartan (ENTRESTO) 97-103 MG Take 1 tablet by mouth 2 (two) times daily. 180 tablet 3   Semaglutide (RYBELSUS) 3 MG TABS Take 1 tablet (3 mg total) by mouth daily. 90 tablet 1   spironolactone (ALDACTONE) 25 MG tablet TAKE ONE TABLET BY MOUTH ONCE DAILY 90 tablet 0   thiamine (VITAMIN B1) 100 MG tablet TAKE ONE TABLET BY MOUTH ONCE DAILY 90 tablet 1   warfarin (COUMADIN) 3 MG tablet TAKE ONE TABLET BY MOUTH DAILY EXCEPT TAKE 1 1/2 TABLETS ON MONDAYS OR AS DIRECTED BY ANTICOAGULATION CLINIC. 110 tablet 1   furosemide (LASIX) 20 MG tablet TAKE ONE TABLET BY MOUTH ONCE DAILY (Patient not taking: Reported on 05/25/2022) 90 tablet 3   meclizine (ANTIVERT)  25 MG tablet Take 1 tablet (25 mg total) by mouth 3 (three) times daily as needed for dizziness. (Patient not taking: Reported on 05/25/2022) 270 tablet 0   pregabalin (LYRICA) 100 MG capsule Take 1 capsule (100 mg total) by mouth 2 (two) times daily. (Patient not taking: Reported on 05/02/2022) 60 capsule 0   No current facility-administered medications for this visit.     Review of Systems  Please see the history of present illness.    (+) Left shoulder pain (+) Bilateral knee pain  All other systems reviewed and are otherwise negative except as noted above.  Physical Exam    Wt Readings from Last 3 Encounters:  05/25/22 180 lb 12.8 oz (82 kg)  05/02/22 179 lb 4 oz (81.3 kg)  02/22/22 175 lb 3.2 oz (79.5 kg)   VS: Vitals:   05/25/22 1501  BP: 128/72  Pulse: 84  SpO2: 96%  ,Body mass index is 35.31 kg/m.  Constitutional:      Appearance: Healthy appearance. Not in distress.  Neck:     Vascular: JVD normal.  Pulmonary:     Effort: Pulmonary effort is normal.     Breath sounds: No wheezing. No rales. Diminished in the bases Cardiovascular:     Normal rate. Regular rhythm. Normal S1. Normal S2.      Murmurs: There is no murmur.  Edema:    Peripheral edema absent.  Abdominal:     Palpations: Abdomen is soft non tender. There is no hepatomegaly.  Skin:    General: Skin is warm and dry.  Neurological:     General: No focal deficit present.     Mental Status: Alert and oriented to person, place and time.     Cranial Nerves: Cranial nerves are intact.  EKG/LABS/ Recent Cardiac Studies    ECG personally reviewed by me today -none completed today  Lab Results  Component Value Date   WBC 5.7 11/01/2021   HGB 12.2 11/22/2021   HGB 12.2 11/22/2021   HCT 36.0 11/22/2021   HCT 36.0 11/22/2021   MCV 84 11/01/2021   PLT 309  11/01/2021   Lab Results  Component Value Date   CREATININE 0.86 02/22/2022   BUN 22 02/22/2022   NA 140 02/22/2022   K 4.3 02/22/2022   CL 105  02/22/2022   CO2 26 02/22/2022   Lab Results  Component Value Date   ALT 9 11/01/2021   AST 13 11/01/2021   ALKPHOS 65 11/01/2021   BILITOT 0.6 11/01/2021   Lab Results  Component Value Date   CHOL 105 11/16/2021   HDL 41.00 11/16/2021   LDLCALC 46 11/16/2021   LDLDIRECT 116.0 11/28/2008   TRIG 90.0 11/16/2021   CHOLHDL 3 11/16/2021    Lab Results  Component Value Date   HGBA1C 8.8 (H) 02/22/2022    Cardiac Studies & Procedures   CARDIAC CATHETERIZATION  CARDIAC CATHETERIZATION 11/22/2021  Narrative 1.  Patent coronary arteries with mild nonobstructive plaquing of the LAD, wide patency of the left main, circumflex, and RCA. 2.  Patent but atretic LIMA graft due to competitive flow through an LAD that has no high-grade stenosis 3.  Low normal LV and RV filling pressures, preserved cardiac output, and no evidence of pulmonary hypertension  Recommend medical therapy for nonobstructive CAD and chronic systolic heart failure with LV dysfunction.  The patient's filling pressures are all in the low normal range and she appears to be very well compensated at present.  Findings Coronary Findings Diagnostic  Dominance: Right  Left Main Vessel is angiographically normal.  Left Anterior Descending Prox LAD to Mid LAD lesion is 40% stenosed. The LAD is patent to the apex.  The vessel has mild nonobstructive disease present.  The first diagonal is a large caliber vessel with no obstruction.  The proximal to mid LAD has 30 to 40% stenosis.  There is some competitive filling seen in the apical portion of the LAD.  Left Circumflex The vessel exhibits minimal luminal irregularities. The circumflex is patent with no obstructive disease.  The vessel supplies 2 OM branches with no obstructive disease.  Right Coronary Artery The vessel exhibits minimal luminal irregularities. The RCA is widely patent with minimal irregularity  LIMA LIMA Graft To Mid LAD LIMA graft was visualized by  angiography and is normal in caliber.  The graft exhibits no disease. The graft is atretic.  The LIMA is patent but atretic because of native vessel flow without obstruction  Intervention  No interventions have been documented.     ECHOCARDIOGRAM  ECHOCARDIOGRAM COMPLETE 05/18/2022  Narrative ECHOCARDIOGRAM REPORT    Patient Name:   Kristen [name] Date of Exam: 05/18/2022 Medical Rec #:  [id number]            Height:       60.0 in Accession #:    [id number]           Weight:       179.2 lb Date of Birth:  05-Jan-1946           BSA:          1.782 m Patient Age:    77 years             BP:           124/70 mmHg Patient Gender: F                    HR:           79 bpm. Exam Location:  [address]  Procedure: 2D Echo, Cardiac Doppler and Color Doppler  Indications:  I50.42 Chronic combined systolic (congestive) and diastolic (congestive) heart failure  History:        Patient has prior history of Echocardiogram examinations, most recent 11/03/2021. CAD, Prior CABG, Arrythmias:Atrial Fibrillation, LBBB and PVC, Signs/Symptoms:Fatigue; Risk Factors:Hypertension, Diabetes, Dyslipidemia and Former Smoker. NSVT. Fibromyalgia.  Sonographer:    Diamond Nickel RCS Referring Phys: [phone] MICHAEL COOPER   Sonographer Comments: Did not administer Definity due to prior allergic reaction. IMPRESSIONS   1. Left ventricular ejection fraction, by estimation, is 50 to 55%. Left ventricular ejection fraction by PLAX is 52 %. The left ventricle has low normal function. The left ventricle demonstrates regional wall motion abnormalities (see scoring diagram/findings for description). Left ventricular diastolic parameters are consistent with Grade I diastolic dysfunction (impaired relaxation). 2. Right ventricular systolic function is normal. The right ventricular size is normal. There is normal pulmonary artery systolic pressure. 3. The mitral valve is normal in structure. Trivial mitral  valve regurgitation. No evidence of mitral stenosis. 4. The aortic valve is tricuspid. Aortic valve regurgitation is trivial. No aortic stenosis is present. 5. The inferior vena cava is normal in size with greater than 50% respiratory variability, suggesting right atrial pressure of 3 mmHg.  FINDINGS Left Ventricle: Left ventricular ejection fraction, by estimation, is 50 to 55%. Left ventricular ejection fraction by PLAX is 52 %. The left ventricle has low normal function. The left ventricle demonstrates regional wall motion abnormalities. The left ventricular internal cavity size was normal in size. There is no left ventricular hypertrophy. Left ventricular diastolic parameters are consistent with Grade I diastolic dysfunction (impaired relaxation). Indeterminate filling pressures.   LV Wall Scoring: The entire septum is hypokinetic. The entire anterior wall, entire lateral wall, entire inferior wall, and apex are normal.  Right Ventricle: The right ventricular size is normal. No increase in right ventricular wall thickness. Right ventricular systolic function is normal. There is normal pulmonary artery systolic pressure. The tricuspid regurgitant velocity is 2.47 m/s, and with an assumed right atrial pressure of 3 mmHg, the estimated right ventricular systolic pressure is [id number].  Left Atrium: Left atrial size was normal in size.  Right Atrium: Right atrial size was normal in size.  Pericardium: There is no evidence of pericardial effusion.  Mitral Valve: The mitral valve is normal in structure. Trivial mitral valve regurgitation. No evidence of mitral valve stenosis.  Tricuspid Valve: The tricuspid valve is normal in structure. Tricuspid valve regurgitation is mild . No evidence of tricuspid stenosis.  Aortic Valve: The aortic valve is tricuspid. Aortic valve regurgitation is trivial. No aortic stenosis is present.  Pulmonic Valve: The pulmonic valve was normal in structure.  Pulmonic valve regurgitation is not visualized. No evidence of pulmonic stenosis.  Aorta: The aortic root is normal in size and structure.  Venous: The inferior vena cava is normal in size with greater than 50% respiratory variability, suggesting right atrial pressure of 3 mmHg.  IAS/Shunts: No atrial level shunt detected by color flow Doppler.   LEFT VENTRICLE PLAX 2D LV EF:         Left            Diastology ventricular     LV e' medial:    5.66 cm/s ejection        LV E/e' medial:  10.2 fraction by     LV e' lateral:   7.62 cm/s PLAX is 52      LV E/e' lateral: 7.6 %. LVIDd:         3.10  cm LVIDs:         2.30 cm LV PW:         0.90 cm LV IVS:        0.70 cm LVOT diam:     2.20 cm LV SV:         48 LV SV Index:   27 LVOT Area:     3.80 cm   RIGHT VENTRICLE RV S prime:     7.40 cm/s TAPSE (M-mode): 0.8 cm RVSP:           27.4 mmHg  LEFT ATRIUM             Index        RIGHT ATRIUM LA diam:        3.60 cm 2.02 cm/m   RA Pressure: 3.00 mmHg LA Vol (A2C):   23.6 ml 13.25 ml/m LA Vol (A4C):   17.2 ml 9.65 ml/m LA Biplane Vol: 20.1 ml 11.28 ml/m AORTIC VALVE LVOT Vmax:   67.20 cm/s LVOT Vmean:  44.700 cm/s LVOT VTI:    0.127 m  AORTA Ao Root diam: 3.20 cm Ao Asc diam:  3.00 cm  MITRAL VALVE               TRICUSPID VALVE MV Area (PHT): 4.77 cm    TR Peak grad:   24.4 mmHg MV Decel Time: 159 msec    TR Vmax:        247.00 cm/s MV E velocity: 57.60 cm/s  Estimated RAP:  3.00 mmHg MV A velocity: 85.40 cm/s  RVSP:           27.4 mmHg MV E/A ratio:  0.67 SHUNTS Systemic VTI:  0.13 m Systemic Diam: 2.20 cm  Skeet Latch MD Electronically signed by Skeet Latch MD Signature Date/Time: 05/18/2022/10:47:03 PM    Final    MONITORS  LONG TERM MONITOR (3-14 DAYS) 07/05/2021  Narrative Patch Wear Time:  14 days and 0 hours (2023-03-25T15:42:32-0400 to 2023-04-08T15:42:36-0400)  Patient had a min HR of 61 bpm, max HR of 156 bpm, and avg HR of 86 bpm.  Predominant underlying rhythm was Sinus Rhythm. Bundle Branch Block/IVCD was present. 1 run of Supraventricular Tachycardia occurred lasting 9 beats with a max rate of 156 bpm (avg 144 bpm). Isolated SVEs were rare (<1.0%), SVE Couplets were rare (<1.0%), and SVE Triplets were rare (<1.0%). Isolated VEs were rare (<1.0%), and no VE Couplets or VE Triplets were present.  SUMMARY: Basic rhythm is normal sinus with an average HR of 86 bpm. There is a short supraventricular run of 9 beats, otherwise rare ectopics. No AFib or bradycardic events. No sustained arrhythmias.           Assessment & Plan    1.  Chronic combined CHF: -2D echo completed in 04/2021 EF of 25-30% down from 50-55% in 2016.  GDMT was titrated and most recent 2D echo shows normal EF of 50-55% -Today patient is euvolemic on examination and reports no increased episodes of shortness of breath.  She is currently holding her Lasix due to being treated for a UTI. -Continue current GDMT with Jardiance 25 mg daily, Toprol-XL 100 mg daily, Entresto 97/103 mg twice daily, Aldactone 25 mg daily and Lasix as needed 20 mg -Low sodium diet, fluid restriction <2L, and daily weights encouraged. Educated to contact our office for weight gain of 2 lbs overnight or 5 lbs in one week.   2.  Coronary artery disease: -s/p CABG in 2004 with  right and left heart cath completed 11/2021 showing normal coronaries and heart pressures. -Today patient reports no chest pain or dizziness since previous visit. -Continue GDMT with Lipitor 40 mg daily, metoprolol 100 mg daily  3.  Paroxysmal atrial fibrillation: -Patient is currently rate controlled with Toprol-XL 100 mg and she reports no episodes of palpitations or tachycardia since previous visit. -Continue Coumadin 3 mg per Coumadin clinic -CHA2DS2-VASc Score = 8 [CHF History: 1, HTN History: 1, Diabetes History: 1, Stroke History: 2, Vascular Disease History: 1, Age Score: 1, Gender Score: 1].  Therefore, the  patient's annual risk of stroke is 10.8 %.      4.  Essential hypertension: -Patient's most recent blood pressure is well-controlled today at 128/72 -Continue current antihypertensive regimen as noted above  5.  CKD stage III: -Patient's last creatinine was 0.86 -Patient is currently not followed by the nephrologist. -Blood pressure is well-controlled       Disposition: Follow-up with Sherren Mocha, MD or APP in 6 months    Medication Adjustments/Labs and Tests Ordered: Current medicines are reviewed at length with the patient today.  Concerns regarding medicines are outlined above.   Signed, Mable Fill, Marissa Nestle, NP 05/25/2022, 3:34 PM [facility]  Note:  This document was prepared using Dragon voice recognition software and may include unintentional dictation errors.

## 2022-05-25 ENCOUNTER — Ambulatory Visit: Payer: Medicare Other | Admitting: Nurse Practitioner

## 2022-05-25 ENCOUNTER — Encounter: Payer: Self-pay | Admitting: Nurse Practitioner

## 2022-05-25 ENCOUNTER — Ambulatory Visit: Payer: Medicare Other | Attending: Nurse Practitioner | Admitting: Nurse Practitioner

## 2022-05-25 VITALS — BP 128/72 | HR 84 | Ht 60.0 in | Wt 180.8 lb

## 2022-05-25 DIAGNOSIS — I25119 Atherosclerotic heart disease of native coronary artery with unspecified angina pectoris: Secondary | ICD-10-CM | POA: Diagnosis not present

## 2022-05-25 DIAGNOSIS — E119 Type 2 diabetes mellitus without complications: Secondary | ICD-10-CM

## 2022-05-25 DIAGNOSIS — N1831 Chronic kidney disease, stage 3a: Secondary | ICD-10-CM | POA: Diagnosis not present

## 2022-05-25 DIAGNOSIS — I480 Paroxysmal atrial fibrillation: Secondary | ICD-10-CM

## 2022-05-25 DIAGNOSIS — I5042 Chronic combined systolic (congestive) and diastolic (congestive) heart failure: Secondary | ICD-10-CM

## 2022-05-25 DIAGNOSIS — Z794 Long term (current) use of insulin: Secondary | ICD-10-CM

## 2022-05-25 NOTE — Patient Instructions (Signed)
Medication Instructions:  Your physician recommends that you continue on your current medications as directed. Please refer to the Current Medication list given to you today. *If you need a refill on your cardiac medications before your next appointment, please call your pharmacy*   Lab Work: None ordered If you have labs (blood work) drawn today and your tests are completely normal, you will receive your results only by: [place] (if you have MyChart) OR A paper copy in the mail If you have any lab test that is abnormal or we need to change your treatment, we will call you to review the results.   Testing/Procedures: None ordered   Follow-Up: At [facility], you and your health needs are our priority.  As part of our continuing mission to provide you with exceptional heart care, we have created designated Provider Care Teams.  These Care Teams include your primary Cardiologist (physician) and Advanced Practice Providers (APPs -  Physician Assistants and Nurse Practitioners) who all work together to provide you with the care you need, when you need it.  We recommend signing up for the patient portal called "MyChart".  Sign up information is provided on this After Visit Summary.  MyChart is used to connect with patients for Virtual Visits (Telemedicine).  Patients are able to view lab/test results, encounter notes, upcoming appointments, etc.  Non-urgent messages can be sent to your provider as well.   To learn more about what you can do with MyChart, go to [email or web address].    Your next appointment:   6 month(s)  Provider:   Sherren Mocha, MD  or Ambrose Pancoast, NP   Other Instructions

## 2022-05-30 ENCOUNTER — Telehealth: Payer: Self-pay | Admitting: Internal Medicine

## 2022-05-30 NOTE — Telephone Encounter (Signed)
Prescription Request  05/30/2022  LOV: 02/22/2022  What is the name of the medication or equipment? gabapentin (NEURONTIN) 300 MG capsule  Pt was taking  pregabalin (LYRICA) 100 MG capsule  but it was making her itch. Have you contacted your pharmacy to request a refill? No   Which pharmacy would you like this sent to?     [facility] - [place], [place] - [address] [place] [address] Phone: [phone] Fax: [phone]   Please advise at Mobile [phone] (mobile)

## 2022-05-31 ENCOUNTER — Other Ambulatory Visit: Payer: Self-pay | Admitting: Internal Medicine

## 2022-05-31 DIAGNOSIS — M797 Fibromyalgia: Secondary | ICD-10-CM

## 2022-05-31 DIAGNOSIS — E1151 Type 2 diabetes mellitus with diabetic peripheral angiopathy without gangrene: Secondary | ICD-10-CM

## 2022-05-31 NOTE — Telephone Encounter (Addendum)
Forms have been faxed again to the [facility] prior auth team.

## 2022-05-31 NOTE — Telephone Encounter (Signed)
Pt reports she has contacted assistance program for Rybelsus, concerning the financial assistance paperwork she left with the office on 3/15. She reports the company has not received the paperwork yet from the office. Advised pt a msg would be sent to PCP and CMA for f/u on assistance paperwork.advised if she does not hear from the office in the next week or the company to contact PCP office. Pt verbalized understanding.

## 2022-06-01 DIAGNOSIS — M150 Primary generalized (osteo)arthritis: Secondary | ICD-10-CM | POA: Diagnosis not present

## 2022-06-01 DIAGNOSIS — M47816 Spondylosis without myelopathy or radiculopathy, lumbar region: Secondary | ICD-10-CM | POA: Diagnosis not present

## 2022-06-01 DIAGNOSIS — Z79891 Long term (current) use of opiate analgesic: Secondary | ICD-10-CM | POA: Diagnosis not present

## 2022-06-01 DIAGNOSIS — G894 Chronic pain syndrome: Secondary | ICD-10-CM | POA: Diagnosis not present

## 2022-06-02 ENCOUNTER — Other Ambulatory Visit (HOSPITAL_COMMUNITY): Payer: Self-pay

## 2022-06-02 NOTE — Telephone Encounter (Signed)
Pt portion received, online application has been submitted to [employer], please allow up to 10 business days for processing.

## 2022-06-03 ENCOUNTER — Other Ambulatory Visit (HOSPITAL_COMMUNITY): Payer: Self-pay

## 2022-06-03 NOTE — Telephone Encounter (Signed)
Pt has been approved for pt assistance through [employer] for Rybelsus 3mg  until 03/07/2023. A 30 day supply will be shipped to provider's office in 10-14 business days.   Patient ID: Kristen [name]:[id number]

## 2022-06-07 ENCOUNTER — Telehealth: Payer: Self-pay

## 2022-06-07 ENCOUNTER — Ambulatory Visit (INDEPENDENT_AMBULATORY_CARE_PROVIDER_SITE_OTHER): Payer: Medicare Other

## 2022-06-07 DIAGNOSIS — Z7901 Long term (current) use of anticoagulants: Secondary | ICD-10-CM | POA: Diagnosis not present

## 2022-06-07 DIAGNOSIS — D510 Vitamin B12 deficiency anemia due to intrinsic factor deficiency: Secondary | ICD-10-CM

## 2022-06-07 LAB — POCT INR: INR: 2 (ref 2.0–3.0)

## 2022-06-07 MED ORDER — CYANOCOBALAMIN 1000 MCG/ML IJ SOLN
1000.0000 ug | Freq: Once | INTRAMUSCULAR | Status: AC
  Administered 2022-06-07: 1000 ug via INTRAMUSCULAR

## 2022-06-07 NOTE — Telephone Encounter (Signed)
Pt called for update on Rybels[employer] assistance. Advised pt she has been approved to 12/31//24 and it should be shipped to office and allow 10-14 days processing. Pt verbalized understanding.

## 2022-06-07 NOTE — Addendum Note (Signed)
Addended by: Randall An A on: 06/07/2022 04:30 PM   Modules accepted: Orders

## 2022-06-07 NOTE — Telephone Encounter (Signed)
Pt in coumadin clinic today and reported she needs to reapply for financial assistance for Jardiance. She is not sure what she needs to do. Printed pt assistance paperwork from [facility] and provided pt with sheets she will need to complete. Advised a msg would be sent to CMA for assistance with provider's part of application. Advised if any problems to contact the clinic. Pt appreciative and verbalized understanding.   Gave paperwork to [employer], PCP's CMA, for PCP to complete.

## 2022-06-07 NOTE — Patient Instructions (Signed)
Pre visit review using our clinic review tool, if applicable. No additional management support is needed unless otherwise documented below in the visit note. 

## 2022-06-07 NOTE — Progress Notes (Signed)
Continue 1 1/2 tablets and the continue 1 tablet daily. Recheck in 4 weeks.  Pt also due for B12 injection. Administered B12 injection in L deltoid. Pt tolerated well.

## 2022-06-07 NOTE — Telephone Encounter (Signed)
From sent to the Rx Patient assistance team.

## 2022-06-09 ENCOUNTER — Emergency Department (HOSPITAL_COMMUNITY)
Admission: EM | Admit: 2022-06-09 | Discharge: 2022-06-09 | Disposition: A | Discharge status: Home / Self Care | Payer: Medicare Other | Attending: Emergency Medicine | Admitting: Emergency Medicine

## 2022-06-09 ENCOUNTER — Emergency Department (HOSPITAL_COMMUNITY): Payer: Medicare Other

## 2022-06-09 ENCOUNTER — Encounter (HOSPITAL_COMMUNITY): Payer: Self-pay

## 2022-06-09 ENCOUNTER — Other Ambulatory Visit: Payer: Self-pay

## 2022-06-09 DIAGNOSIS — M8588 Other specified disorders of bone density and structure, other site: Secondary | ICD-10-CM | POA: Diagnosis not present

## 2022-06-09 DIAGNOSIS — Z794 Long term (current) use of insulin: Secondary | ICD-10-CM | POA: Insufficient documentation

## 2022-06-09 DIAGNOSIS — R29898 Other symptoms and signs involving the musculoskeletal system: Secondary | ICD-10-CM | POA: Diagnosis not present

## 2022-06-09 DIAGNOSIS — Z951 Presence of aortocoronary bypass graft: Secondary | ICD-10-CM | POA: Diagnosis not present

## 2022-06-09 DIAGNOSIS — M25551 Pain in right hip: Secondary | ICD-10-CM | POA: Diagnosis not present

## 2022-06-09 DIAGNOSIS — Z7984 Long term (current) use of oral hypoglycemic drugs: Secondary | ICD-10-CM | POA: Insufficient documentation

## 2022-06-09 DIAGNOSIS — R531 Weakness: Secondary | ICD-10-CM | POA: Diagnosis not present

## 2022-06-09 DIAGNOSIS — Z7722 Contact with and (suspected) exposure to environmental tobacco smoke (acute) (chronic): Secondary | ICD-10-CM | POA: Diagnosis not present

## 2022-06-09 DIAGNOSIS — Z743 Need for continuous supervision: Secondary | ICD-10-CM | POA: Diagnosis not present

## 2022-06-09 DIAGNOSIS — I10 Essential (primary) hypertension: Secondary | ICD-10-CM | POA: Insufficient documentation

## 2022-06-09 DIAGNOSIS — E119 Type 2 diabetes mellitus without complications: Secondary | ICD-10-CM | POA: Insufficient documentation

## 2022-06-09 DIAGNOSIS — I469 Cardiac arrest, cause unspecified: Secondary | ICD-10-CM | POA: Diagnosis not present

## 2022-06-09 DIAGNOSIS — I2510 Atherosclerotic heart disease of native coronary artery without angina pectoris: Secondary | ICD-10-CM | POA: Diagnosis not present

## 2022-06-09 DIAGNOSIS — Z7901 Long term (current) use of anticoagulants: Secondary | ICD-10-CM | POA: Diagnosis not present

## 2022-06-09 DIAGNOSIS — Z79899 Other long term (current) drug therapy: Secondary | ICD-10-CM | POA: Insufficient documentation

## 2022-06-09 DIAGNOSIS — N390 Urinary tract infection, site not specified: Secondary | ICD-10-CM | POA: Diagnosis not present

## 2022-06-09 DIAGNOSIS — W19XXXA Unspecified fall, initial encounter: Secondary | ICD-10-CM | POA: Diagnosis not present

## 2022-06-09 DIAGNOSIS — Z8673 Personal history of transient ischemic attack (TIA), and cerebral infarction without residual deficits: Secondary | ICD-10-CM | POA: Diagnosis not present

## 2022-06-09 LAB — BASIC METABOLIC PANEL
Anion gap: 14 (ref 5–15)
BUN: 24 mg/dL — ABNORMAL HIGH (ref 8–23)
CO2: 18 mmol/L — ABNORMAL LOW (ref 22–32)
Calcium: 9.4 mg/dL (ref 8.9–10.3)
Chloride: 103 mmol/L (ref 98–111)
Creatinine, Ser: 0.83 mg/dL (ref 0.44–1.00)
GFR, Estimated: >60 mL/min (ref >60)
Glucose, Bld: 169 mg/dL — ABNORMAL HIGH (ref 70–99)
Potassium: 4.5 mmol/L (ref 3.5–5.1)
Sodium: 135 mmol/L (ref 135–145)

## 2022-06-09 LAB — CBC
HCT: 38.5 % (ref 36.0–46.0)
Hemoglobin: 12.2 g/dL (ref 12.0–15.0)
MCH: 27.1 pg (ref 26.0–34.0)
MCHC: 31.7 g/dL (ref 30.0–36.0)
MCV: 85.4 fL (ref 80.0–100.0)
Platelets: 285 10*3/uL (ref 150–400)
RBC: 4.51 MIL/uL (ref 3.87–5.11)
RDW: 17.6 % — ABNORMAL HIGH (ref 11.5–15.5)
WBC: 5.1 10*3/uL (ref 4.0–10.5)
nRBC: 0 % (ref 0.0–0.2)

## 2022-06-09 LAB — URINALYSIS, ROUTINE W REFLEX MICROSCOPIC
Bilirubin Urine: NEGATIVE
Glucose, UA: 50 mg/dL — AB
Hgb urine dipstick: NEGATIVE
Ketones, ur: NEGATIVE mg/dL
Nitrite: NEGATIVE
Protein, ur: NEGATIVE mg/dL
Specific Gravity, Urine: 1.017 (ref 1.005–1.030)
pH: 5 (ref 5.0–8.0)

## 2022-06-09 LAB — TROPONIN I (HIGH SENSITIVITY)
Troponin I (High Sensitivity): 4 ng/L (ref <18)
Troponin I (High Sensitivity): 5 ng/L (ref <18)

## 2022-06-09 MED ORDER — CEFDINIR 300 MG PO CAPS: 300.0000 mg | ORAL_CAPSULE | Freq: Two times a day (BID) | ORAL | 0 refills | Status: AC

## 2022-06-09 MED ORDER — CEFDINIR 300 MG PO CAPS
300.0000 mg | ORAL_CAPSULE | Freq: Once | ORAL | Status: AC
  Administered 2022-06-09: 300 mg via ORAL
  Filled 2022-06-09: qty 1

## 2022-06-09 NOTE — ED Notes (Signed)
Pt assisted to RR to attempt urine sample and unable to provide one.

## 2022-06-09 NOTE — ED Provider Triage Note (Signed)
Emergency Medicine Provider Triage Evaluation Note  Marizza Funari , a 77 y.o. female  was evaluated in triage.  Pt complains of weakness x 3 days. She notes that she unfortunately witnessed her husband have a cardiac arrest prior to the onset of her symptoms. No meds tried at home. Denies chest pain, shortness of breath, abdominal pain, nausea, vomiting, urinary symptoms. Notes that she was being treated for an UTI.   Review of Systems  Positive Negative:   Physical Exam  BP 118/66   Pulse 84   Temp 98 F (36.7 C) (Oral)   Resp 16   Ht 5' (1.524 m)   Wt 81.6 kg   SpO2 96%   BMI 35.15 kg/m  Gen:   Awake, no distress   Resp:  Normal effort  MSK:   Moves extremities without difficulty  Other:  Negative pronator drift. Strength and sensation intact to BUE and BLE. Grip strength 5/5 bilaterally.  Medical Decision Making  Medically screening exam initiated at 1:51 PM.  Appropriate orders placed.  [name] was informed that the remainder of the evaluation will be completed by another provider, this initial triage assessment does not replace that evaluation, and the importance of remaining in the ED until their evaluation is complete.  Work-up initiated.    Avid Guillette A, PA-C 06/09/22 1355

## 2022-06-09 NOTE — ED Provider Notes (Signed)
[place] EMERGENCY DEPARTMENT AT [facility] Provider Note  CSN: [id number] Arrival date & time: 06/09/22 1334  Chief Complaint(s) Weakness  HPI Kristen [name] is a 77 y.o. female history of fibromyalgia, hypertension, hyperlipidemia, obesity, prior stroke, paroxysmal atrial fibrillation on anticoagulation, diabetes presenting with weakness.  She reports weakness for the past 3 days.  Reports that she feels weak all over.  She denies any fevers.  She reports she had a fall and was unable to get up.  She reports some low back pain after her fall.  Does not think she hit her head.  She does report some urinary frequency and was recently treated for UTI.  No nausea or vomiting.  No chest or abdominal pain.  No numbness or tingling.  No focal weakness.  She does note that her husband had a cardiac arrest 3 days ago which she witnessed and she has been feeling a lot of stress about this.   Past Medical History Past Medical History:  Diagnosis Date   Arthritis    Blood transfusion without reported diagnosis    Bursitis    CAD (coronary artery disease)    a. s/p CABG 2004.b. stable cath 2014 demonstrating stable CAD and continued patency of her LIMA graft.   Chronic anticoagulation    on coumadin   DDD (degenerative disc disease), lumbar    Depression    Diabetes mellitus    Diastolic dysfunction    per echo in October 2012 with EF 50 to 55%   Fibromyalgia    GERD (gastroesophageal reflux disease) 10/23/2003   Headache(784.0)    Hyperlipidemia    Hypertension    Hypokalemia    LBBB (left bundle branch block)    Lumbar back pain    LV dysfunction    a. EF 45% by cath 2014. b. EF 50-55% by technically difficult echo in 08/2014.   Lymphadenitis    Morbid obesity    a. Sleep study negative for significant OSA in 11/2014.   PAF (paroxysmal atrial fibrillation)    Stroke 2004   affected speech per pt   Patient Active Problem List   Diagnosis Date Noted    Insulin-requiring or dependent type II diabetes mellitus 11/21/2021   Hyperlipidemia associated with type 2 diabetes mellitus 11/21/2021   Pressure injury of sacral region, stage 2 11/16/2021   Flu vaccine need 11/16/2021   Encounter for general adult medical examination with abnormal findings 11/16/2021   Urine frequency 11/16/2021   Hyperbilirubinemia 07/23/2021   Vertigo 07/23/2021   Delusions of parasitosis 07/07/2021   Severe episode of recurrent major depressive disorder, with psychotic features 06/15/2021   GAD (generalized anxiety disorder) 06/02/2021   Stage 3a chronic kidney disease 03/17/2021   PAF (paroxysmal atrial fibrillation)    Thiamine deficiency 12/30/2018   Vitamin B12 deficiency anemia due to intrinsic factor deficiency 12/20/2018   Chronic combined systolic and diastolic CHF (congestive heart failure) 12/19/2018   Coronary artery disease involving native coronary artery of native heart with angina pectoris 12/19/2018   Obesity with body mass index (BMI) of 30.0 to 39.9 01/15/2018   Chronic idiopathic constipation 12/27/2017   Type 2 diabetes mellitus without complication, with long-term current use of insulin 10/30/2017   Eczema 08/07/2017   Insomnia secondary to chronic pain 08/04/2016   Visit for screening mammogram 04/14/2016   LV dysfunction    DM (diabetes mellitus), type 2 with peripheral vascular complications 12/01/2012   PVC's/nonsustained VT 10/24/2012   H/O: CVA (cerebrovascular accident)  02/09/2012   Hypothyroidism 05/27/2011   Fibromyalgia 10/08/2010   Long term current use of anticoagulant 05/05/2010   Low back pain, non-specific 05/12/2008   Cognitive dysfunction associated with depression 01/29/2007   Osteoarthritis 11/22/2006   Mixed diabetic hyperlipidemia associated with type 2 diabetes mellitus 09/13/2006   Coronary atherosclerosis 09/13/2006   Paroxysmal atrial fibrillation 09/13/2006   GERD without esophagitis 09/13/2006   Hypertension  associated with diabetes (HCC) 08/30/2006   Home Medication(s) Prior to Admission medications   Medication Sig Start Date End Date Taking? Authorizing Provider  cefdinir (OMNICEF) 300 MG capsule Take 1 capsule (300 mg total) by mouth 2 (two) times daily for 7 days. 06/09/22 06/16/22 Yes Lonell Grandchild, MD  ALPRAZolam Prudy Feeler) 1 MG tablet TAKE ONE TABLET BY MOUTH THREE TIMES DAILY AS NEEDED 04/28/22   Pincus Sanes, MD  atorvastatin (LIPITOR) 40 MG tablet TAKE ONE TABLET BY MOUTH ONCE DAILY 04/06/22   Etta Grandchild, MD  COMFORT EZ PEN NEEDLES 31G X 8 MM MISC USE WITH insulin AS DIRECTED 02/22/22   Etta Grandchild, MD  Continuous Blood Gluc Receiver (DEXCOM G7 RECEIVER) DEVI 1 Act by Does not apply route daily. 11/21/21   Etta Grandchild, MD  Continuous Blood Gluc Sensor (DEXCOM G7 SENSOR) MISC 1 Act by Does not apply route daily. 11/21/21   Etta Grandchild, MD  cyclobenzaprine (FLEXERIL) 10 MG tablet Take 10 mg by mouth 3 (three) times daily as needed. 02/18/22   [provider]  diclofenac Sodium (VOLTAREN) 1 % GEL Apply 2 g topically 4 (four) times daily. Patient taking differently: Apply 2 g topically 4 (four) times daily. As needed 07/27/21   Marguerita Merles Latif, DO  furosemide (LASIX) 20 MG tablet TAKE ONE TABLET BY MOUTH ONCE DAILY Patient not taking: Reported on 05/25/2022 04/06/22   Tonny Bollman, MD  gabapentin (NEURONTIN) 300 MG capsule TAKE ONE CAPSULE BY MOUTH THREE TIMES DAILY 05/31/22   Etta Grandchild, MD  insulin glargine, 2 Unit Dial, (TOUJEO MAX SOLOSTAR) 300 UNIT/ML Solostar Pen Inject 40 Units into the skin daily. 11/21/21   Etta Grandchild, MD  JARDIANCE 25 MG TABS tablet TAKE ONE TABLET BY MOUTH ONCE DAILY 04/25/22   Etta Grandchild, MD  magnesium oxide (MAG-OX) 400 MG tablet TAKE ONE TABLET BY MOUTH TWICE DAILY 04/06/22   Tonny Bollman, MD  meclizine (ANTIVERT) 25 MG tablet Take 1 tablet (25 mg total) by mouth 3 (three) times daily as needed for dizziness. Patient not  taking: Reported on 05/25/2022 07/08/21   Etta Grandchild, MD  metFORMIN (GLUCOPHAGE-XR) 500 MG 24 hr tablet TAKE 3 TABLETS(1500 MG) BY MOUTH DAILY WITH BREAKFAST 12/31/21   Etta Grandchild, MD  metoprolol succinate (TOPROL-XL) 100 MG 24 hr tablet TAKE ONE TABLET BY MOUTH EVERY MORNING and TAKE ONE TABLET BY MOUTH EVERYDAY AT BEDTIME 04/06/22   Tonny Bollman, MD  nitroGLYCERIN (NITROSTAT) 0.4 MG SL tablet Place 1 tablet (0.4 mg total) under the tongue every 5 (five) minutes as needed for chest pain (x 3 doses). Reported on 05/08/2015 08/12/21   Tonny Bollman, MD  oxyCODONE-acetaminophen (PERCOCET) 10-325 MG tablet Take 1 tablet by mouth every 4 (four) hours as needed for pain.    [provider]  polyethylene glycol (MIRALAX / GLYCOLAX) 17 g packet Take 17 g by mouth daily. Patient taking differently: Take 17 g by mouth as needed. 07/27/21   Marguerita Merles Latif, DO  potassium chloride SA (KLOR-CON M) 20 MEQ tablet  TAKE ONE TABLET BY MOUTH EVERY MORNING and TAKE ONE TABLET BY MOUTH EVERYDAY AT BEDTIME 02/07/22   Tonny Bollman, MD  sacubitril-valsartan (ENTRESTO) 97-103 MG Take 1 tablet by mouth 2 (two) times daily. 05/11/22   Tonny Bollman, MD  Semaglutide (RYBELSUS) 3 MG TABS Take 1 tablet (3 mg total) by mouth daily. 04/01/22   Etta Grandchild, MD  spironolactone (ALDACTONE) 25 MG tablet TAKE ONE TABLET BY MOUTH ONCE DAILY 04/06/22   Etta Grandchild, MD  thiamine (VITAMIN B1) 100 MG tablet TAKE ONE TABLET BY MOUTH ONCE DAILY 12/25/21   Etta Grandchild, MD  warfarin (COUMADIN) 3 MG tablet TAKE ONE TABLET BY MOUTH DAILY EXCEPT TAKE 1 1/2 TABLETS ON MONDAYS OR AS DIRECTED BY ANTICOAGULATION CLINIC. 11/16/21   Etta Grandchild, MD                                                                                                                                    Past Surgical History Past Surgical History:  Procedure Laterality Date   ABDOMINAL HYSTERECTOMY     ANGIOPLASTY  laminectomy    CHOLECYSTECTOMY     CORONARY ARTERY BYPASS GRAFT     LIMA to LAD    KNEE ARTHROSCOPY     LEFT HEART CATHETERIZATION WITH CORONARY/GRAFT ANGIOGRAM  12/07/2012   Procedure: LEFT HEART CATHETERIZATION WITH Isabel Caprice;  Surgeon: Micheline Chapman, MD;  Location: [facility] CATH LAB;  Service: Cardiovascular;;   LUMBAR LAMINECTOMY     x3   RIGHT/LEFT HEART CATH AND CORONARY/GRAFT ANGIOGRAPHY N/A 11/22/2021   Procedure: RIGHT/LEFT HEART CATH AND CORONARY/GRAFT ANGIOGRAPHY;  Surgeon: Tonny Bollman, MD;  Location: [facility] INVASIVE CV LAB;  Service: Cardiovascular;  Laterality: N/A;   SPHINCTEROTOMY     TONSILLECTOMY     Family History Family History  Problem Relation Age of Onset   Stroke Mother    Hypertension Mother    Arthritis Mother    Heart attack Father    Heart disease Father    Depression Sister    Heart attack Sister    Kidney cancer Sister    Hypertension Brother    Diabetes Brother    Arthritis Brother    Anxiety disorder Maternal Aunt    Diabetes Maternal Aunt        x 2   Colon cancer Paternal Uncle    Hypertension Son    Obesity Son     Social History Social History   Tobacco Use   Smoking status: Former    Types: Cigarettes    Quit date: 03/07/2001    Years since quitting: 21.2    Passive exposure: Current   Smokeless tobacco: Never  Vaping Use   Vaping Use: Never used  Substance Use Topics   Alcohol use: No   Drug use: No   Allergies Metformin and related, Cleocin [clindamycin hcl], Codeine, Doxycycline hyclate, Macrolides and ketolides, Morphine, Pentazocine lactate, Vibramycin [doxycycline  calcium], Clindamycin, Definity [perflutren lipid microsphere], Doxycycline, Metformin, Pentazocine, Sulfa antibiotics, and Sulfonamide derivatives  Review of Systems Review of Systems  All other systems reviewed and are negative.   Physical Exam Vital Signs  I have reviewed the triage vital signs BP 108/81   Pulse 75   Temp (!) 97.5 F (36.4 C)   Resp 20    Ht 5' (1.524 m)   Wt 81.6 kg   SpO2 98%   BMI 35.15 kg/m  Physical Exam Vitals and nursing note reviewed.  Constitutional:      General: She is not in acute distress.    Appearance: She is well-developed.  HENT:     Head: Normocephalic and atraumatic.     Mouth/Throat:     Mouth: Mucous membranes are moist.  Eyes:     Pupils: Pupils are equal, round, and reactive to light.  Cardiovascular:     Rate and Rhythm: Normal rate and regular rhythm.     Heart sounds: No murmur heard. Pulmonary:     Effort: Pulmonary effort is normal. No respiratory distress.     Breath sounds: Normal breath sounds.  Abdominal:     General: Abdomen is flat.     Palpations: Abdomen is soft.     Tenderness: There is no abdominal tenderness.  Musculoskeletal:        General: No tenderness.     Right lower leg: No edema.     Left lower leg: No edema.     Comments: No focal injury or deformity to the bilateral upper or lower extremities, with intact range of motion and no focal tenderness.  No midline C, T spine tenderness.  Mild lumbar spine midline and paraspinal tenderness.  Skin:    General: Skin is warm and dry.  Neurological:     General: No focal deficit present.     Mental Status: She is alert and oriented to person, place, and time. Mental status is at baseline.     Comments: Cranial nerves II through XII intact, strength 5 out of 5 in the bilateral upper and lower extremities, no sensory deficit to light touch, no dysmetria on finger-nose-finger testing, ambulatory  Psychiatric:        Mood and Affect: Mood normal.        Behavior: Behavior normal.     ED Results and Treatments Labs (all labs ordered are listed, but only abnormal results are displayed) Labs Reviewed  BASIC METABOLIC PANEL - Abnormal; Notable for the following components:      Result Value   CO2 18 (*)    Glucose, Bld 169 (*)    BUN 24 (*)    All other components within normal limits  CBC - Abnormal; Notable for the  following components:   RDW 17.6 (*)    All other components within normal limits  URINALYSIS, ROUTINE W REFLEX MICROSCOPIC - Abnormal; Notable for the following components:   Glucose, UA 50 (*)    Leukocytes,Ua LARGE (*)    Bacteria, UA RARE (*)    All other components within normal limits  TROPONIN I (HIGH SENSITIVITY)  TROPONIN I (HIGH SENSITIVITY)  Radiology No results found.  Pertinent labs & imaging results that were available during my care of the patient were reviewed by me and considered in my medical decision making (see MDM for details).  Medications Ordered in ED Medications  cefdinir (OMNICEF) capsule 300 mg (300 mg Oral Given 06/09/22 2324)                                                                                                                                     Procedures Procedures  (including critical care time)  Medical Decision Making / ED Course   MDM:  77 year old female presents to the emergency department with generalized weakness.  Patient overall well-appearing, vitals reassuring.  Exam overall nonfocal aside from some lower lumbar tenderness.  Her weakness could be in the setting of emotional trauma, could also represent symptoms of urinary infection.  Laboratory testing is overall reassuring without toxic or metabolic abnormalities to explain her weakness.  CT head negative for intracranial process.  CT lumbar spine obtained given fall and low back pain negative for acute fracture.  Chest x-ray without signs of pneumonia.  No evidence of anemia.  Patient able to ambulate in the emergency department.  Discussed findings with the patient and family.  They feel comfortable with discharge home and prescription for antibiotics for suspected urinary infection. Will discharge patient to home. All questions answered. Patient comfortable  with plan of discharge. Return precautions discussed with patient and specified on the after visit summary.       Additional history obtained: -Additional history obtained from family -External records from outside source obtained and reviewed including: Chart review including previous notes, labs, imaging, consultation notes including cardiology note 05/25/22   Lab Tests: -I ordered, reviewed, and interpreted labs.   The pertinent results include:   Labs Reviewed  BASIC METABOLIC PANEL - Abnormal; Notable for the following components:      Result Value   CO2 18 (*)    Glucose, Bld 169 (*)    BUN 24 (*)    All other components within normal limits  CBC - Abnormal; Notable for the following components:   RDW 17.6 (*)    All other components within normal limits  URINALYSIS, ROUTINE W REFLEX MICROSCOPIC - Abnormal; Notable for the following components:   Glucose, UA 50 (*)    Leukocytes,Ua LARGE (*)    Bacteria, UA RARE (*)    All other components within normal limits  TROPONIN I (HIGH SENSITIVITY)  TROPONIN I (HIGH SENSITIVITY)    Notable for signs of UTI  EKG   EKG Interpretation  Date/Time:  Thursday June 09 2022 13:41:37 EDT Ventricular Rate:  83 PR Interval:  154 QRS Duration: 138 QT Interval:  394 QTC Calculation: 462 R Axis:   -87 Text Interpretation: Normal sinus rhythm Left axis deviation Non-specific intra-ventricular conduction block Minimal voltage criteria for LVH, may be normal variant ( Cornell product )  Possible Anterolateral infarct , age undetermined Abnormal ECG When compared with ECG of 22-Jul-2021 22:00, PREVIOUS ECG IS PRESENT No significant change since last tracing Confirmed by Lorre Nick ([id number]) on 06/10/2022 1:08:23 PM         Imaging Studies ordered: I ordered imaging studies including CXR, CT head, CT lumbar On my interpretation imaging demonstrates no acute process I independently visualized and interpreted imaging. I agree with the  radiologist interpretation   Medicines ordered and prescription drug management: Meds ordered this encounter  Medications   cefdinir (OMNICEF) capsule 300 mg   cefdinir (OMNICEF) 300 MG capsule    Sig: Take 1 capsule (300 mg total) by mouth 2 (two) times daily for 7 days.    Dispense:  14 capsule    Refill:  0    -I have reviewed the patients home medicines and have made adjustments as needed   Social Determinants of Health:  Diagnosis or treatment significantly limited by social determinants of health: obesity   Reevaluation: After the interventions noted above, I reevaluated the patient and found that their symptoms have improved  Co morbidities that complicate the patient evaluation  Past Medical History:  Diagnosis Date   Arthritis    Blood transfusion without reported diagnosis    Bursitis    CAD (coronary artery disease)    a. s/p CABG 2004.b. stable cath 2014 demonstrating stable CAD and continued patency of her LIMA graft.   Chronic anticoagulation    on coumadin   DDD (degenerative disc disease), lumbar    Depression    Diabetes mellitus    Diastolic dysfunction    per echo in October 2012 with EF 50 to 55%   Fibromyalgia    GERD (gastroesophageal reflux disease) 10/23/2003   Headache(784.0)    Hyperlipidemia    Hypertension    Hypokalemia    LBBB (left bundle branch block)    Lumbar back pain    LV dysfunction    a. EF 45% by cath 2014. b. EF 50-55% by technically difficult echo in 08/2014.   Lymphadenitis    Morbid obesity    a. Sleep study negative for significant OSA in 11/2014.   PAF (paroxysmal atrial fibrillation)    Stroke 2004   affected speech per pt      Dispostion: Disposition decision including need for hospitalization was considered, and patient discharged from emergency department.    Final Clinical Impression(s) / ED Diagnoses Final diagnoses:  Weakness  Urinary tract infection without hematuria, site unspecified     This  chart was dictated using voice recognition software.  Despite best efforts to proofread,  errors can occur which can change the documentation meaning.    Lonell Grandchild, MD 06/10/22 [phone]

## 2022-06-09 NOTE — ED Triage Notes (Addendum)
Reports weakness in bilateral legs x 3 days.  Reports she fell and was unable to get her self up which is not normal.  Patient reports she had extractions and was on her way to the dentist patient reports a lot of emotional stress since husband went into cardiac arrest 3 days ago. Reports just finished a course of antibiotics for UTI

## 2022-06-09 NOTE — Discharge Instructions (Addendum)
We evaluated you for your weakness.  Your testing showed signs of a urinary infection.  We got x-rays of your chest and hip and CT scans of your head and your low back.  We did not see any dangerous problems on your x-rays.  Your laboratory tests otherwise were reassuring.  Please follow-up closely with your primary doctor.  Please return to the emergency department if you develop any numbness or tingling, difficulty walking, incontinence, fevers, vomiting, abdominal pain, chest pain, or any other new symptoms.

## 2022-06-15 ENCOUNTER — Telehealth: Payer: Self-pay

## 2022-06-15 NOTE — Telephone Encounter (Signed)
     Patient  visit on 4/4  at [facility]    Have you been able to follow up with your primary care physician? no  The patient was or was not able to obtain any needed medicine or equipment. Yes   Are there diet recommendations that you are having difficulty following? Yes   Patient expresses understanding of discharge instructions and education provided has no other needs at this time.  yes     Lenard Forth [facility], [facility] [phone] [address] [place], [place], [place] [address] Phone: [phone] Email: [email or web address]

## 2022-06-29 ENCOUNTER — Telehealth: Payer: Self-pay

## 2022-06-29 DIAGNOSIS — Z7901 Long term (current) use of anticoagulants: Secondary | ICD-10-CM

## 2022-06-29 NOTE — Telephone Encounter (Signed)
Pt has been scheduled for 4/29 @ 9.20

## 2022-06-29 NOTE — Telephone Encounter (Signed)
Pt has stated she very constipated and is wanting to know what can she do or if Dr. Yetta Barre can send her something safe to take to help her use the bathroom as her lower abdomen is cramping and she can't go.   Pt can be reached at [phone]

## 2022-06-30 NOTE — Telephone Encounter (Signed)
Pts husband passed away on 06/16/22  Pt called reporting she will need to change the time of her coumadin clinic apt on 4/30 due to another apt. Advised pt a lab INR could be drawn at her PCP apt on 4/29 so she would not have to come to the clinic two different days. Pt would appreciate it if lab INR could be drawn on 4/29 PCP apt. Advised this nurse will f/u on 4/29 when result is received. Pt verbalized understanding and was very appreciative.   Placed order for lab INR.

## 2022-06-30 NOTE — Addendum Note (Signed)
Addended by: Sherrie George A on: 06/30/2022 12:18 PM   Modules accepted: Orders

## 2022-07-04 ENCOUNTER — Ambulatory Visit: Payer: Self-pay

## 2022-07-04 ENCOUNTER — Ambulatory Visit (INDEPENDENT_AMBULATORY_CARE_PROVIDER_SITE_OTHER): Payer: Medicare Other | Admitting: Internal Medicine

## 2022-07-04 ENCOUNTER — Ambulatory Visit (INDEPENDENT_AMBULATORY_CARE_PROVIDER_SITE_OTHER): Payer: Medicare Other

## 2022-07-04 ENCOUNTER — Encounter: Payer: Self-pay | Admitting: Internal Medicine

## 2022-07-04 VITALS — BP 124/76 | HR 79 | Temp 97.7°F | Ht 60.0 in | Wt 174.0 lb

## 2022-07-04 DIAGNOSIS — E039 Hypothyroidism, unspecified: Secondary | ICD-10-CM

## 2022-07-04 DIAGNOSIS — N1831 Chronic kidney disease, stage 3a: Secondary | ICD-10-CM | POA: Diagnosis not present

## 2022-07-04 DIAGNOSIS — R10813 Right lower quadrant abdominal tenderness: Secondary | ICD-10-CM

## 2022-07-04 DIAGNOSIS — Z7901 Long term (current) use of anticoagulants: Secondary | ICD-10-CM | POA: Diagnosis not present

## 2022-07-04 DIAGNOSIS — K5904 Chronic idiopathic constipation: Secondary | ICD-10-CM

## 2022-07-04 DIAGNOSIS — E119 Type 2 diabetes mellitus without complications: Secondary | ICD-10-CM | POA: Diagnosis not present

## 2022-07-04 DIAGNOSIS — Z794 Long term (current) use of insulin: Secondary | ICD-10-CM

## 2022-07-04 DIAGNOSIS — E1151 Type 2 diabetes mellitus with diabetic peripheral angiopathy without gangrene: Secondary | ICD-10-CM

## 2022-07-04 DIAGNOSIS — R350 Frequency of micturition: Secondary | ICD-10-CM

## 2022-07-04 DIAGNOSIS — K5900 Constipation, unspecified: Secondary | ICD-10-CM | POA: Diagnosis not present

## 2022-07-04 LAB — PROTIME-INR
INR: 1.7 ratio — ABNORMAL HIGH (ref 0.8–1.0)
Prothrombin Time: 18.1 s — ABNORMAL HIGH (ref 9.6–13.1)

## 2022-07-04 LAB — URINALYSIS, ROUTINE W REFLEX MICROSCOPIC
Bilirubin Urine: NEGATIVE
Hgb urine dipstick: NEGATIVE
Ketones, ur: NEGATIVE
Leukocytes,Ua: NEGATIVE
Nitrite: NEGATIVE
RBC / HPF: NONE SEEN (ref 0–?)
Specific Gravity, Urine: 1.02 (ref 1.000–1.030)
Total Protein, Urine: NEGATIVE
Urine Glucose: >=1000 — AB
Urobilinogen, UA: 0.2 (ref 0.0–1.0)
pH: 5.5 (ref 5.0–8.0)

## 2022-07-04 LAB — TSH: TSH: 2.42 u[IU]/mL (ref 0.35–5.50)

## 2022-07-04 LAB — HEMOGLOBIN A1C: Hgb A1c MFr Bld: 8.7 % — ABNORMAL HIGH (ref 4.6–6.5)

## 2022-07-04 MED ORDER — MOTEGRITY 2 MG PO TABS
1.0000 | ORAL_TABLET | Freq: Every day | ORAL | 1 refills | Status: DC
Start: 2022-07-04 — End: 2022-10-20

## 2022-07-04 NOTE — Patient Instructions (Addendum)
Pre visit review using our clinic review tool, if applicable. No additional management support is needed unless otherwise documented below in the visit note.  Increase dose today to take 1 1/2 tablets and then continue 1 tablet daily. Recheck in 2 weeks.

## 2022-07-04 NOTE — Progress Notes (Signed)
Pt had PCP OV today and lab INR was drawn. Increase dose today to take 1 1/2 tablets and then continue 1 tablet daily. Recheck in 2 weeks.  Contacted pt by phone and advised of dosing and made next coumadin clinic apt. Pt verbalized understanding.

## 2022-07-04 NOTE — Patient Instructions (Signed)

## 2022-07-04 NOTE — Progress Notes (Unsigned)
Subjective:  Patient ID: Kristen [name], female    DOB: [birth year]  Age: 77 y.o. MRN: [id number]  CC: Abdominal Pain   HPI Kristen [name] presents for f/up ---  She complains that she has not had a BM in 3 days and now has abd pain. She has tried several OTC remedies with no success. She denies N/V/diarrhea/melena/BRBPR.  Outpatient Medications Prior to Visit  Medication Sig Dispense Refill   ALPRAZolam (XANAX) 1 MG tablet TAKE ONE TABLET BY MOUTH THREE TIMES DAILY AS NEEDED 270 tablet 0   atorvastatin (LIPITOR) 40 MG tablet TAKE ONE TABLET BY MOUTH ONCE DAILY 90 tablet 1   COMFORT EZ PEN NEEDLES 31G X 8 MM MISC USE WITH insulin AS DIRECTED 100 each 3   Continuous Blood Gluc Receiver (DEXCOM G7 RECEIVER) DEVI 1 Act by Does not apply route daily. 9 each 1   Continuous Blood Gluc Sensor (DEXCOM G7 SENSOR) MISC 1 Act by Does not apply route daily. 9 each 1   cyclobenzaprine (FLEXERIL) 10 MG tablet Take 10 mg by mouth 3 (three) times daily as needed.     diclofenac Sodium (VOLTAREN) 1 % GEL Apply 2 g topically 4 (four) times daily. (Patient taking differently: Apply 2 g topically 4 (four) times daily. As needed) 100 g 0   furosemide (LASIX) 20 MG tablet TAKE ONE TABLET BY MOUTH ONCE DAILY 90 tablet 3   gabapentin (NEURONTIN) 300 MG capsule TAKE ONE CAPSULE BY MOUTH THREE TIMES DAILY 270 capsule 1   insulin glargine, 2 Unit Dial, (TOUJEO MAX SOLOSTAR) 300 UNIT/ML Solostar Pen Inject 40 Units into the skin daily. 12 mL 1   JARDIANCE 25 MG TABS tablet TAKE ONE TABLET BY MOUTH ONCE DAILY 90 tablet 1   magnesium oxide (MAG-OX) 400 MG tablet TAKE ONE TABLET BY MOUTH TWICE DAILY 180 tablet 3   meclizine (ANTIVERT) 25 MG tablet Take 1 tablet (25 mg total) by mouth 3 (three) times daily as needed for dizziness. 270 tablet 0   metFORMIN (GLUCOPHAGE-XR) 500 MG 24 hr tablet TAKE 3 TABLETS(1500 MG) BY MOUTH DAILY WITH BREAKFAST 270 tablet 1   metoprolol succinate (TOPROL-XL) 100 MG 24 hr  tablet TAKE ONE TABLET BY MOUTH EVERY MORNING and TAKE ONE TABLET BY MOUTH EVERYDAY AT BEDTIME 180 tablet 3   nitroGLYCERIN (NITROSTAT) 0.4 MG SL tablet Place 1 tablet (0.4 mg total) under the tongue every 5 (five) minutes as needed for chest pain (x 3 doses). Reported on 05/08/2015 35 tablet 2   oxyCODONE-acetaminophen (PERCOCET) 10-325 MG tablet Take 1 tablet by mouth every 4 (four) hours as needed for pain.     polyethylene glycol (MIRALAX / GLYCOLAX) 17 g packet Take 17 g by mouth daily. (Patient taking differently: Take 17 g by mouth as needed.) 14 each 0   potassium chloride SA (KLOR-CON M) 20 MEQ tablet TAKE ONE TABLET BY MOUTH EVERY MORNING and TAKE ONE TABLET BY MOUTH EVERYDAY AT BEDTIME 180 tablet 2   sacubitril-valsartan (ENTRESTO) 97-103 MG Take 1 tablet by mouth 2 (two) times daily. 180 tablet 3   Semaglutide (RYBELSUS) 3 MG TABS Take 1 tablet (3 mg total) by mouth daily. 90 tablet 1   spironolactone (ALDACTONE) 25 MG tablet TAKE ONE TABLET BY MOUTH ONCE DAILY 90 tablet 0   thiamine (VITAMIN B1) 100 MG tablet TAKE ONE TABLET BY MOUTH ONCE DAILY 90 tablet 1   warfarin (COUMADIN) 3 MG tablet TAKE ONE TABLET BY MOUTH DAILY EXCEPT TAKE 1 1/2  TABLETS ON MONDAYS OR AS DIRECTED BY ANTICOAGULATION CLINIC. 110 tablet 1   No facility-administered medications prior to visit.    ROS Review of Systems  Constitutional:  Positive for fatigue. Negative for appetite change, chills, diaphoresis and unexpected weight change.  HENT: Negative.    Eyes: Negative.   Respiratory:  Negative for cough, chest tightness, shortness of breath and wheezing.   Gastrointestinal:  Positive for abdominal pain and constipation. Negative for abdominal distention, blood in stool, diarrhea, nausea and vomiting.  Endocrine: Positive for polyuria.  Genitourinary:  Positive for frequency. Negative for decreased urine volume, difficulty urinating, dysuria, flank pain, hematuria and urgency.  Musculoskeletal: Negative.    Skin: Negative.   Neurological: Negative.  Negative for dizziness and weakness.  Hematological:  Negative for adenopathy. Does not bruise/bleed easily.  Psychiatric/Behavioral:  Positive for dysphoric mood and sleep disturbance. Negative for behavioral problems, confusion, decreased concentration, hallucinations and suicidal ideas. The patient is nervous/anxious. The patient is not hyperactive.     Objective:  BP 124/76 (BP Location: Right Arm, Patient Position: Sitting, Cuff Size: Large)   Pulse 79   Temp 97.7 F (36.5 C) (Oral)   Ht 5' (1.524 m)   Wt 174 lb (78.9 kg)   SpO2 95%   BMI 33.98 kg/m   BP Readings from Last 3 Encounters:  07/04/22 124/76  06/09/22 108/81  05/25/22 128/72    Wt Readings from Last 3 Encounters:  07/04/22 174 lb (78.9 kg)  06/09/22 180 lb (81.6 kg)  05/25/22 180 lb 12.8 oz (82 kg)    Physical Exam Vitals reviewed. Exam conducted with a chaperone present (Shirron).  Constitutional:      Appearance: She is not ill-appearing.  HENT:     Nose: Nose normal.     Mouth/Throat:     Mouth: Mucous membranes are moist.  Eyes:     General: No scleral icterus.    Conjunctiva/sclera: Conjunctivae normal.  Cardiovascular:     Rate and Rhythm: Normal rate and regular rhythm.     Heart sounds: No murmur heard.    No gallop.  Pulmonary:     Effort: Pulmonary effort is normal.     Breath sounds: No stridor. No wheezing, rhonchi or rales.  Abdominal:     General: Abdomen is flat. Bowel sounds are absent.     Palpations: There is no hepatomegaly, splenomegaly or mass.     Tenderness: There is abdominal tenderness in the right lower quadrant. There is no guarding.  Genitourinary:    Rectum: Guaiac result negative. No mass, tenderness, anal fissure, external hemorrhoid or internal hemorrhoid. Normal anal tone.     Comments: Stool in proximal rectum but no impaction Musculoskeletal:        General: Normal range of motion.     Cervical back: Neck supple.      Right lower leg: No edema.     Left lower leg: No edema.  Lymphadenopathy:     Cervical: No cervical adenopathy.  Skin:    General: Skin is warm and dry.  Neurological:     General: No focal deficit present.     Mental Status: She is alert. Mental status is at baseline.  Psychiatric:        Attention and Perception: Attention and perception normal.        Mood and Affect: Mood is depressed. Mood is not anxious. Affect is flat and tearful.        Speech: Speech is delayed. Speech is not tangential.  Behavior: Behavior normal.        Thought Content: Thought content is not paranoid or delusional. Thought content does not include homicidal or suicidal ideation. Thought content does not include homicidal or suicidal plan.        Cognition and Memory: Cognition normal.        Judgment: Judgment normal.     Lab Results  Component Value Date   WBC 5.1 06/09/2022   HGB 12.2 06/09/2022   HCT 38.5 06/09/2022   PLT 285 06/09/2022   GLUCOSE 169 (H) 06/09/2022   CHOL 105 11/16/2021   TRIG 90.0 11/16/2021   HDL 41.00 11/16/2021   LDLDIRECT 116.0 11/28/2008   LDLCALC 46 11/16/2021   ALT 9 11/01/2021   AST 13 11/01/2021   NA 135 06/09/2022   K 4.5 06/09/2022   CL 103 06/09/2022   CREATININE 0.83 06/09/2022   BUN 24 (H) 06/09/2022   CO2 18 (L) 06/09/2022   TSH 2.42 07/04/2022   INR 1.7 (H) 07/04/2022   HGBA1C 8.7 (H) 07/04/2022   MICROALBUR <0.7 11/16/2021    CT Lumbar Spine Wo Contrast  Result Date: 06/09/2022 CLINICAL DATA:  Recent fall, bilateral leg weakness. EXAM: CT LUMBAR SPINE WITHOUT CONTRAST TECHNIQUE: Multidetector CT imaging of the lumbar spine was performed without intravenous contrast administration. Multiplanar CT image reconstructions were also generated. RADIATION DOSE REDUCTION: This exam was performed according to the departmental dose-optimization program which includes automated exposure control, adjustment of the mA and/or kV according to patient size  and/or use of iterative reconstruction technique. COMPARISON:  CT abdomen and pelvis 07/22/2021 FINDINGS: Segmentation: 5 lumbar type vertebrae. Alignment: No new alignment abnormality. There is a minimal broad-based lower thoracic lumbar levorotary scoliosis apex T11-12, and chronic grade 1 degenerative anterolisthesis at L3-4 without further listhesis. Vertebrae: The vertebra are normal heights. There is chronic osteopenia and diffuse marrow heterogeneity, which was present previously and unchanged. No fracture is evident. There is moderate spondylosis throughout the lower thoracic and lumbar region, without bulky osteophytes. At L4-5, there are 2 interbody metallic cylinder grafts and again noted mature chronic solid interbody fusion with fusion of the posterior elements as well. There are no destructive bone lesions. Both SI joints are patent with spurring change, and vacuum phenomenon on the left. Paraspinal and other soft tissues: Negative apart from patchy aortoiliac atherosclerosis and partial fatty atrophy of the dorsal paraspinal musculature. Old cholecystectomy. No paraspinal mass or fluid collections. Disc levels: T8-9 through T11-12: The discs are collapsed and bidirectional osteophytes are present at both levels but do not significantly narrow the thecal sac. Due to facet hypertrophy, there is severe right and moderate left foraminal stenosis at all of these levels. T12-L1: The disc is normal height. There is a posterior annular calcification but no herniation or stenosis. Asymmetric right facet hypertrophy moderately narrows the right foramen. L1-2: Mild disc space loss and vacuum disc phenomenon. There is a partially calcified central disc protrusion and dorsal epidural fat causing AP stenosis of the thecal sac to 7 mm with mild encroachment on the subarticular zones along with mild facet spurring. The foramina are patent. L2-3: Degenerative disc collapse with vacuum phenomenon and bidirectional  osteophytes. A rim calcified chronic central disc protrusion, with dorsal ligamentous and facet hypertrophy in combination causes moderate 5.5 mm thecal sac AP stenosis with mild-to-moderate encroachment on the subarticular zones. Due to facet joint spurring there is moderate to severe left foraminal stenosis. L3-4: There is mild disc space loss and small endplate spurs. Advanced  facet hypertrophy with dorsal ligamentous hypertrophy and ossification. Chronic broad-based posterior disc bulge in combination with these factors causes moderate 5.5 mm AP thecal sac stenosis and partial effacement of the subarticular zones. There is moderate left and mild right foraminal stenosis. L4-5: Mature fusion.  Spinal canal and foramina are clear. L5-S1: There is moderate disc space loss, concentric disc osteophyte complex lateralizing to the left laterally where there is attempted bridging of osteophytes. Posteriorly there is no significant nerve root encroachment or spinal canal narrowing. There is advanced left and moderate right facet hypertrophy with severe left, moderate to severe right foraminal stenosis. IMPRESSION: 1. Osteopenia and degenerative change without evidence of fractures. 2. Chronic grade 1 degenerative anterolisthesis at L3-4 without further listhesis. Slight scoliosis. 3. Chronic osteopenia and diffuse marrow heterogeneity, unchanged. 4. Multilevel degenerative disc and facet changes with moderate spinal canal stenosis at L2-3 and L3-4, and milder spinal stenosis at L1-2. 5. Multilevel foraminal stenosis as above. 6. Chronic mature fusion L4-5. 7. Aortic atherosclerosis. Aortic Atherosclerosis (ICD10-I70.0). Electronically Signed   By: Almira Bar M.D.   On: 06/09/2022 22:57   DG Hip Unilat With Pelvis 2-3 Views Right  Result Date: 06/09/2022 CLINICAL DATA:  Hip pain fall EXAM: DG HIP (WITH OR WITHOUT PELVIS) 2-3V RIGHT COMPARISON:  CT 07/22/2021 FINDINGS: Hardware in the lumbar spine. SI joints are non  widened. Pubic symphysis and rami appear intact. No fracture or dislocation. IMPRESSION: No acute osseous abnormality Electronically Signed   By: Jasmine Pang M.D.   On: 06/09/2022 21:43   CT Head Wo Contrast  Result Date: 06/09/2022 CLINICAL DATA:  Weakness EXAM: CT HEAD WITHOUT CONTRAST TECHNIQUE: Contiguous axial images were obtained from the base of the skull through the vertex without intravenous contrast. RADIATION DOSE REDUCTION: This exam was performed according to the departmental dose-optimization program which includes automated exposure control, adjustment of the mA and/or kV according to patient size and/or use of iterative reconstruction technique. COMPARISON:  MRI Head 08/13/21 FINDINGS: Brain: No evidence of acute infarction, hemorrhage, hydrocephalus, extra-axial collection or mass lesion/mass effect. Partially empty sella. Sequela of moderate chronic microvascular ischemic change. Vascular: No hyperdense vessel or unexpected calcification. Skull: Normal. Negative for fracture or focal lesion. Sinuses/Orbits: No middle ear or mastoid effusion. Paranasal sinuses are clear. Orbits are unremarkable. Other: None IMPRESSION: No acute intracranial abnormality. Electronically Signed   By: Lorenza Cambridge M.D.   On: 06/09/2022 15:27   DG Chest 2 View  Result Date: 06/09/2022 CLINICAL DATA:  Fall and weakness. EXAM: CHEST - 2 VIEW COMPARISON:  Chest x-ray dated Jul 26, 2021. FINDINGS: The heart size and mediastinal contours are within normal limits. Prior CABG. Normal pulmonary vascularity. Continued low lung volumes with similar chronic mild linear scarring. No focal consolidation, pleural effusion, or pneumothorax. No acute osseous abnormality. IMPRESSION: 1. No acute cardiopulmonary disease. Electronically Signed   By: Obie Dredge M.D.   On: 06/09/2022 15:23   DG ABD ACUTE 2+V W 1V CHEST  Result Date: 07/04/2022 CLINICAL DATA:  Right lower quadrant tenderness to palpation. Constipation for 3  weeks. EXAM: DG ABDOMEN ACUTE WITH 1 VIEW CHEST COMPARISON:  CT 07/22/2021 FINDINGS: No dilated loops of large or small bowel. There is moderate stool burden identified within the colon and rectum with mild gaseous distension of the splenic and hepatic flexures. No air fluid levels identified. No signs of pneumoperitoneum. Heart size and mediastinal contours are unremarkable. Previous median sternotomy and CABG procedure. IMPRESSION: 1. Nonobstructive bowel gas pattern. 2. Moderate stool  burden within the colon and rectum. Electronically Signed   By: Signa Kell M.D.   On: 07/04/2022 10:24      Assessment & Plan:   DM (diabetes mellitus), type 2 with peripheral vascular complications (HCC) -     Hemoglobin A1c; Future  Acquired hypothyroidism- She is euthyroid -     TSH; Future  Insulin-requiring or dependent type II diabetes mellitus (HCC)  Long term current use of anticoagulant -     Protime-INR; Future  Stage 3a chronic kidney disease (HCC) -     Urinalysis, Routine w reflex microscopic; Future  Urine frequency -     Urinalysis, Routine w reflex microscopic; Future -     CULTURE, URINE COMPREHENSIVE; Future  Right lower quadrant abdominal tenderness without rebound tenderness- Exam and plain films are reassuring. -     DG ABD ACUTE 2+V W 1V CHEST; Future -     Urinalysis, Routine w reflex microscopic; Future  Chronic idiopathic constipation -     Motegrity; Take 1 tablet (2 mg total) by mouth daily.  Dispense: 90 tablet; Refill: 1     Follow-up: Return in about 4 months (around 11/03/2022).  Sanda Linger, MD

## 2022-07-05 ENCOUNTER — Telehealth: Payer: Self-pay | Admitting: Cardiovascular Disease

## 2022-07-05 ENCOUNTER — Ambulatory Visit: Payer: Medicare Other

## 2022-07-05 DIAGNOSIS — G894 Chronic pain syndrome: Secondary | ICD-10-CM | POA: Diagnosis not present

## 2022-07-05 DIAGNOSIS — M47816 Spondylosis without myelopathy or radiculopathy, lumbar region: Secondary | ICD-10-CM | POA: Diagnosis not present

## 2022-07-05 DIAGNOSIS — Z79891 Long term (current) use of opiate analgesic: Secondary | ICD-10-CM | POA: Diagnosis not present

## 2022-07-05 DIAGNOSIS — M150 Primary generalized (osteo)arthritis: Secondary | ICD-10-CM | POA: Diagnosis not present

## 2022-07-05 NOTE — Telephone Encounter (Signed)
Spoke with the patient and advised her that she is fine to take ducolax.

## 2022-07-05 NOTE — Telephone Encounter (Signed)
Yes this is just fine to take.

## 2022-07-05 NOTE — Telephone Encounter (Signed)
Pt c/o medication issue:  1. Name of Medication:   Dulcolax  2. How are you currently taking this medication (dosage and times per day)?   3. Are you having a reaction (difficulty breathing--STAT)?   4. What is your medication issue?   Patient wants to know if this medication is safe for her to take due to her heart condition.  Patient states her stomach feels tight and bloated and wants to know an alternate medication if this medication is not advised.

## 2022-07-06 ENCOUNTER — Encounter: Payer: Self-pay | Admitting: Internal Medicine

## 2022-07-06 LAB — CULTURE, URINE COMPREHENSIVE

## 2022-07-12 ENCOUNTER — Other Ambulatory Visit: Payer: Self-pay | Admitting: Internal Medicine

## 2022-07-12 DIAGNOSIS — J069 Acute upper respiratory infection, unspecified: Secondary | ICD-10-CM | POA: Diagnosis not present

## 2022-07-12 DIAGNOSIS — E1151 Type 2 diabetes mellitus with diabetic peripheral angiopathy without gangrene: Secondary | ICD-10-CM

## 2022-07-12 DIAGNOSIS — I5042 Chronic combined systolic (congestive) and diastolic (congestive) heart failure: Secondary | ICD-10-CM

## 2022-07-12 DIAGNOSIS — E119 Type 2 diabetes mellitus without complications: Secondary | ICD-10-CM

## 2022-07-12 MED ORDER — SPIRONOLACTONE 25 MG PO TABS: 25.0000 mg | ORAL_TABLET | Freq: Every day | ORAL | 1 refills | Status: DC

## 2022-07-12 MED ORDER — RYBELSUS 7 MG PO TABS
7.0000 mg | ORAL_TABLET | Freq: Every day | ORAL | 1 refills | Status: DC
Start: 2022-07-12 — End: 2024-01-25

## 2022-07-13 ENCOUNTER — Ambulatory Visit: Payer: Medicare Other | Admitting: Family Medicine

## 2022-07-15 ENCOUNTER — Ambulatory Visit (INDEPENDENT_AMBULATORY_CARE_PROVIDER_SITE_OTHER): Payer: Medicare Other

## 2022-07-15 ENCOUNTER — Telehealth: Payer: Self-pay | Admitting: Internal Medicine

## 2022-07-15 DIAGNOSIS — Z7901 Long term (current) use of anticoagulants: Secondary | ICD-10-CM

## 2022-07-15 LAB — POCT INR: INR: 1.8 — AB (ref 2.0–3.0)

## 2022-07-15 NOTE — Patient Instructions (Addendum)
Pre visit review using our clinic review tool, if applicable. No additional management support is needed unless otherwise documented below in the visit note.  Increase dose today to take 1 1/2 tablets and then change weekly dose to take 1 tablet daily except take 1 1/2 tablets on Wednesdays.  Re-check in 2 weeks. 

## 2022-07-15 NOTE — Progress Notes (Addendum)
Increase dose today to take 1 1/2 tablets and then change weekly dose to take 1 tablet daily except take 1 1/2 tablets on Wednesdays. Recheck in 2 weeks.  Pt requested financial assistance paperwork for Jardiance. Printed from [employer]. Pt will complete and return to clinic.  Pt also requested samples of Jardiance. Gave pt 2 boxes, 25mg , Jardiance samples. Recorded in book by CMA, Shirron.

## 2022-07-15 NOTE — Telephone Encounter (Signed)
Patient dropped off document  paper work from the [place] , to be filled out by provider. Patient requested to send it via Call Patient to pick up within 7-days. Document is located in providers tray at front office.Please advise at Mobile [phone] (mobile)

## 2022-07-15 NOTE — Telephone Encounter (Signed)
I have placed forms on PCP desk for review.

## 2022-07-19 ENCOUNTER — Emergency Department (HOSPITAL_BASED_OUTPATIENT_CLINIC_OR_DEPARTMENT_OTHER): Payer: Medicare Other

## 2022-07-19 ENCOUNTER — Other Ambulatory Visit: Payer: Self-pay

## 2022-07-19 ENCOUNTER — Emergency Department (HOSPITAL_BASED_OUTPATIENT_CLINIC_OR_DEPARTMENT_OTHER)
Admission: EM | Admit: 2022-07-19 | Discharge: 2022-07-19 | Disposition: A | Discharge status: Home / Self Care | Payer: Medicare Other | Attending: Emergency Medicine | Admitting: Emergency Medicine

## 2022-07-19 ENCOUNTER — Other Ambulatory Visit (HOSPITAL_BASED_OUTPATIENT_CLINIC_OR_DEPARTMENT_OTHER): Payer: Self-pay

## 2022-07-19 ENCOUNTER — Encounter (HOSPITAL_BASED_OUTPATIENT_CLINIC_OR_DEPARTMENT_OTHER): Payer: Self-pay

## 2022-07-19 DIAGNOSIS — E119 Type 2 diabetes mellitus without complications: Secondary | ICD-10-CM | POA: Diagnosis not present

## 2022-07-19 DIAGNOSIS — Z7984 Long term (current) use of oral hypoglycemic drugs: Secondary | ICD-10-CM | POA: Insufficient documentation

## 2022-07-19 DIAGNOSIS — J069 Acute upper respiratory infection, unspecified: Secondary | ICD-10-CM | POA: Diagnosis not present

## 2022-07-19 DIAGNOSIS — I2510 Atherosclerotic heart disease of native coronary artery without angina pectoris: Secondary | ICD-10-CM | POA: Diagnosis not present

## 2022-07-19 DIAGNOSIS — R051 Acute cough: Secondary | ICD-10-CM | POA: Diagnosis not present

## 2022-07-19 DIAGNOSIS — E039 Hypothyroidism, unspecified: Secondary | ICD-10-CM | POA: Diagnosis not present

## 2022-07-19 DIAGNOSIS — I110 Hypertensive heart disease with heart failure: Secondary | ICD-10-CM | POA: Insufficient documentation

## 2022-07-19 DIAGNOSIS — Z7901 Long term (current) use of anticoagulants: Secondary | ICD-10-CM | POA: Insufficient documentation

## 2022-07-19 DIAGNOSIS — R059 Cough, unspecified: Secondary | ICD-10-CM | POA: Diagnosis not present

## 2022-07-19 DIAGNOSIS — I509 Heart failure, unspecified: Secondary | ICD-10-CM | POA: Insufficient documentation

## 2022-07-19 DIAGNOSIS — Z20822 Contact with and (suspected) exposure to covid-19: Secondary | ICD-10-CM | POA: Diagnosis not present

## 2022-07-19 DIAGNOSIS — Z79899 Other long term (current) drug therapy: Secondary | ICD-10-CM | POA: Diagnosis not present

## 2022-07-19 DIAGNOSIS — Z794 Long term (current) use of insulin: Secondary | ICD-10-CM | POA: Insufficient documentation

## 2022-07-19 LAB — BASIC METABOLIC PANEL
Anion gap: 9 (ref 5–15)
BUN: 13 mg/dL (ref 8–23)
CO2: 25 mmol/L (ref 22–32)
Calcium: 9.5 mg/dL (ref 8.9–10.3)
Chloride: 106 mmol/L (ref 98–111)
Creatinine, Ser: 0.72 mg/dL (ref 0.44–1.00)
GFR, Estimated: >60 mL/min (ref >60)
Glucose, Bld: 169 mg/dL — ABNORMAL HIGH (ref 70–99)
Potassium: 4.2 mmol/L (ref 3.5–5.1)
Sodium: 140 mmol/L (ref 135–145)

## 2022-07-19 LAB — CBC WITH DIFFERENTIAL/PLATELET
Abs Immature Granulocytes: 0.01 10*3/uL (ref 0.00–0.07)
Basophils Absolute: 0 10*3/uL (ref 0.0–0.1)
Basophils Relative: 1 %
Eosinophils Absolute: 0.1 10*3/uL (ref 0.0–0.5)
Eosinophils Relative: 1 %
HCT: 37.5 % (ref 36.0–46.0)
Hemoglobin: 11.7 g/dL — ABNORMAL LOW (ref 12.0–15.0)
Immature Granulocytes: 0 %
Lymphocytes Relative: 59 %
Lymphs Abs: 2.1 10*3/uL (ref 0.7–4.0)
MCH: 27.3 pg (ref 26.0–34.0)
MCHC: 31.2 g/dL (ref 30.0–36.0)
MCV: 87.4 fL (ref 80.0–100.0)
Monocytes Absolute: 0.4 10*3/uL (ref 0.1–1.0)
Monocytes Relative: 10 %
Neutro Abs: 1 10*3/uL — ABNORMAL LOW (ref 1.7–7.7)
Neutrophils Relative %: 29 %
Platelets: 245 10*3/uL (ref 150–400)
RBC: 4.29 MIL/uL (ref 3.87–5.11)
RDW: 17.5 % — ABNORMAL HIGH (ref 11.5–15.5)
WBC: 3.6 10*3/uL — ABNORMAL LOW (ref 4.0–10.5)
nRBC: 0 % (ref 0.0–0.2)

## 2022-07-19 LAB — BRAIN NATRIURETIC PEPTIDE: B Natriuretic Peptide: 26.9 pg/mL (ref 0.0–100.0)

## 2022-07-19 LAB — SARS CORONAVIRUS 2 BY RT PCR: SARS Coronavirus 2 by RT PCR: NEGATIVE

## 2022-07-19 MED ORDER — BENZONATATE 100 MG PO CAPS: 100.0000 mg | ORAL_CAPSULE | Freq: Three times a day (TID) | ORAL | 0 refills | Status: DC

## 2022-07-19 NOTE — ED Triage Notes (Signed)
Pt c/o productive cough w clear sputum, "feeling bad for over 2wks." Reports recent death of spouse June 17, 2022  Hx chf, denies known swelling, additional symptoms.

## 2022-07-19 NOTE — ED Provider Notes (Signed)
[place] EMERGENCY DEPARTMENT AT [facility] Provider Note   CSN: [id number] Arrival date & time: 07/19/22  1914     History  Chief Complaint  Patient presents with   URI    Kristen [name] is a 77 y.o. female history of diabetes, CHF, CVA currently on Coumadin, CAD, hypertension, atrial fibrillation, hypothyroidism presented with 2 weeks of nonproductive cough.  Patient states that her son is also sick at home with the same symptoms.  Patient states that she has not tried any medications but has been using lemon tea which seems to help.  Patient states she does know if she has any seasonal allergies and does not take antihistamines.  Patient states she is able to eat and drink without issue.  Patient also notes that she is currently grieving her husband who passed away last month after going into cardiac arrest and has not been able to see a therapist yet.  Patient denies chest pain, shortness of breath, abdominal pain, nausea/vomiting, feet/hand swelling, fevers, headache, vision changes, recent travel/hospitalization/surgery, hemoptysis, personal history of cancer  Home Medications Prior to Admission medications   Medication Sig Start Date End Date Taking? Authorizing Provider  benzonatate (TESSALON) 100 MG capsule Take 1 capsule (100 mg total) by mouth every 8 (eight) hours. 07/19/22  Yes Megann Easterwood, Beverly Gust, PA-C  ALPRAZolam Prudy Feeler) 1 MG tablet TAKE ONE TABLET BY MOUTH THREE TIMES DAILY AS NEEDED 04/28/22   Pincus Sanes, MD  atorvastatin (LIPITOR) 40 MG tablet TAKE ONE TABLET BY MOUTH ONCE DAILY 04/06/22   Etta Grandchild, MD  COMFORT EZ PEN NEEDLES 31G X 8 MM MISC USE WITH insulin AS DIRECTED 02/22/22   Etta Grandchild, MD  Continuous Blood Gluc Receiver (DEXCOM G7 RECEIVER) DEVI 1 Act by Does not apply route daily. 11/21/21   Etta Grandchild, MD  Continuous Blood Gluc Sensor (DEXCOM G7 SENSOR) MISC 1 Act by Does not apply route daily. 11/21/21   Etta Grandchild, MD   cyclobenzaprine (FLEXERIL) 10 MG tablet Take 10 mg by mouth 3 (three) times daily as needed. 02/18/22   [provider]  diclofenac Sodium (VOLTAREN) 1 % GEL Apply 2 g topically 4 (four) times daily. Patient taking differently: Apply 2 g topically 4 (four) times daily. As needed 07/27/21   Marguerita Merles Latif, DO  furosemide (LASIX) 20 MG tablet TAKE ONE TABLET BY MOUTH ONCE DAILY 04/06/22   Tonny Bollman, MD  gabapentin (NEURONTIN) 300 MG capsule TAKE ONE CAPSULE BY MOUTH THREE TIMES DAILY 05/31/22   Etta Grandchild, MD  insulin glargine, 2 Unit Dial, (TOUJEO MAX SOLOSTAR) 300 UNIT/ML Solostar Pen Inject 40 Units into the skin daily. 11/21/21   Etta Grandchild, MD  JARDIANCE 25 MG TABS tablet TAKE ONE TABLET BY MOUTH ONCE DAILY 04/25/22   Etta Grandchild, MD  magnesium oxide (MAG-OX) 400 MG tablet TAKE ONE TABLET BY MOUTH TWICE DAILY 04/06/22   Tonny Bollman, MD  meclizine (ANTIVERT) 25 MG tablet Take 1 tablet (25 mg total) by mouth 3 (three) times daily as needed for dizziness. 07/08/21   Etta Grandchild, MD  metFORMIN (GLUCOPHAGE-XR) 500 MG 24 hr tablet TAKE 3 TABLETS(1500 MG) BY MOUTH DAILY WITH BREAKFAST 12/31/21   Etta Grandchild, MD  metoprolol succinate (TOPROL-XL) 100 MG 24 hr tablet TAKE ONE TABLET BY MOUTH EVERY MORNING and TAKE ONE TABLET BY MOUTH EVERYDAY AT BEDTIME 04/06/22   Tonny Bollman, MD  nitroGLYCERIN (NITROSTAT) 0.4 MG SL tablet Place 1  tablet (0.4 mg total) under the tongue every 5 (five) minutes as needed for chest pain (x 3 doses). Reported on 05/08/2015 08/12/21   Tonny Bollman, MD  oxyCODONE-acetaminophen (PERCOCET) 10-325 MG tablet Take 1 tablet by mouth every 4 (four) hours as needed for pain.    [provider]  polyethylene glycol (MIRALAX / GLYCOLAX) 17 g packet Take 17 g by mouth daily. Patient taking differently: Take 17 g by mouth as needed. 07/27/21   Marguerita Merles Latif, DO  potassium chloride SA (KLOR-CON M) 20 MEQ tablet TAKE ONE TABLET BY MOUTH  EVERY MORNING and TAKE ONE TABLET BY MOUTH EVERYDAY AT BEDTIME 02/07/22   Tonny Bollman, MD  Prucalopride Succinate (MOTEGRITY) 2 MG TABS Take 1 tablet (2 mg total) by mouth daily. 07/04/22   Etta Grandchild, MD  sacubitril-valsartan (ENTRESTO) 97-103 MG Take 1 tablet by mouth 2 (two) times daily. 05/11/22   Tonny Bollman, MD  Semaglutide (RYBELSUS) 7 MG TABS Take 1 tablet (7 mg total) by mouth daily. 07/12/22   Etta Grandchild, MD  spironolactone (ALDACTONE) 25 MG tablet Take 1 tablet (25 mg total) by mouth daily. 07/12/22   Etta Grandchild, MD  thiamine (VITAMIN B1) 100 MG tablet TAKE ONE TABLET BY MOUTH ONCE DAILY 12/25/21   Etta Grandchild, MD  warfarin (COUMADIN) 3 MG tablet TAKE ONE TABLET BY MOUTH DAILY EXCEPT TAKE 1 1/2 TABLETS ON MONDAYS OR AS DIRECTED BY ANTICOAGULATION CLINIC. 11/16/21   Etta Grandchild, MD      Allergies    Metformin and related, Cleocin [clindamycin hcl], Codeine, Doxycycline hyclate, Macrolides and ketolides, Morphine, Pentazocine lactate, Vibramycin [doxycycline calcium], Clindamycin, Definity [perflutren lipid microsphere], Doxycycline, Metformin, Pentazocine, Sulfa antibiotics, and Sulfonamide derivatives    Review of Systems   Review of Systems  Physical Exam Updated Vital Signs BP 117/76   Pulse 93   Temp 98.2 F (36.8 C)   Resp 17   SpO2 97%  Physical Exam Vitals reviewed.  Constitutional:      General: She is not in acute distress. HENT:     Head: Normocephalic and atraumatic.     Right Ear: Tympanic membrane, ear canal and external ear normal.     Left Ear: Tympanic membrane, ear canal and external ear normal.     Nose: Nose normal.     Mouth/Throat:     Mouth: Mucous membranes are moist.     Pharynx: No oropharyngeal exudate or posterior oropharyngeal erythema.  Eyes:     Extraocular Movements: Extraocular movements intact.     Conjunctiva/sclera: Conjunctivae normal.     Pupils: Pupils are equal, round, and reactive to light.  Cardiovascular:      Rate and Rhythm: Normal rate and regular rhythm.     Pulses: Normal pulses.     Heart sounds: Normal heart sounds.     Comments: 2+ bilateral radial/dorsalis pedis pulses with regular rate Pulmonary:     Effort: Pulmonary effort is normal. No respiratory distress.     Breath sounds: Normal breath sounds.  Abdominal:     Palpations: Abdomen is soft.     Tenderness: There is no abdominal tenderness. There is no guarding or rebound.  Musculoskeletal:        General: Normal range of motion.     Cervical back: Normal range of motion and neck supple.     Right lower leg: No edema.     Left lower leg: No edema.     Comments: 5 out of  5 bilateral grip/leg extension strength  Skin:    General: Skin is warm and dry.     Capillary Refill: Capillary refill takes less than 2 seconds.  Neurological:     General: No focal deficit present.     Mental Status: She is alert and oriented to person, place, and time.     Comments: Sensation intact in all 4 limbs  Psychiatric:        Mood and Affect: Mood normal.     ED Results / Procedures / Treatments   Labs (all labs ordered are listed, but only abnormal results are displayed) Labs Reviewed  BASIC METABOLIC PANEL - Abnormal; Notable for the following components:      Result Value   Glucose, Bld 169 (*)    All other components within normal limits  CBC WITH DIFFERENTIAL/PLATELET - Abnormal; Notable for the following components:   WBC 3.6 (*)    Hemoglobin 11.7 (*)    RDW 17.5 (*)    Neutro Abs 1.0 (*)    All other components within normal limits  SARS CORONAVIRUS 2 BY RT PCR  BRAIN NATRIURETIC PEPTIDE    EKG None  Radiology DG Chest Port 1 View  Result Date: 07/19/2022 CLINICAL DATA:  Cough, history of CHF. EXAM: PORTABLE CHEST 1 VIEW COMPARISON:  Chest radiograph 07/04/2022. FINDINGS: Clear lungs. Stable cardiac and mediastinal contours. No pleural effusion or pneumothorax. Visualized bones and upper abdomen are unremarkable.  IMPRESSION: No evidence of acute cardiopulmonary disease. Electronically Signed   By: Orvan Falconer M.D.   On: 07/19/2022 11:26    Procedures Procedures    Medications Ordered in ED Medications - No data to display  ED Course/ Medical Decision Making/ A&P                             Medical Decision Making  Erinn Schorer 77 y.o. presented today for URI like symptoms. Working DDx that I considered at this time includes, but not limited to, viral illness, seasonal allergies, pneumonia, meningitis, electrolyte abnormalities, anemia.  R/o DDx: Pneumonia, meningitis, electrolyte abnormalities, anemia: These diagnoses are inconsistent with patient's history, physical exam, lab/imaging  Review of prior external notes: 06/09/2022 ED  Unique Tests and My Interpretation:  COVID: Negative BNP: Unremarkable [id number] Chest x-ray: No acute cardiopulmonary changes  Discussion with Independent Historian: None  Discussion of Management of Tests: None  Risk: Medium: prescription drug management  Risk Stratification Score: None  Staffed with Rhunette Croft, MD  Plan: Patient presented for nonproductive cough.  Patient was not in any acute distress and had stable vitals.  Patient states that she does have a son at home with the same symptoms.  Patient's physical exam was unremarkable.  Due to patient's past medical history of heart failure a BNP was ordered along with basic blood labs.  I highly suspect this is viral in nature versus seasonal allergies as patient does not take any antihistamines.  Labs and chest x-ray were all reassuring and I spoke to the patient about these findings and patient stated that she was comfortable being discharged and taking Claritin and Zyrtec in the morning and following with her primary care provider.  I spoke to the patient about speaking with her primary care provider in regards to possibly being referred to a therapist due to losing  her husband last month to cardiac arrest.  Patient was given return precautions.patient stable for discharge at this time.  Patient  verbalized understanding of plan.         Final Clinical Impression(s) / ED Diagnoses Final diagnoses:  Acute cough    Rx / DC Orders ED Discharge Orders          Ordered    benzonatate (TESSALON) 100 MG capsule  Every 8 hours        07/19/22 1211              Netta Corrigan, PA-C 07/19/22 1215    Derwood Kaplan, MD 07/20/22 1108

## 2022-07-19 NOTE — Discharge Instructions (Signed)
Please follow-up with your primary care provider today regarding recent ER visit and symptoms.  Today your labs and imaging are all reassuring.  Please take Claritin and Zyrtec in the morning as this may be related to seasonal allergies.  Also please discuss with your primary care provider if they can refer you to a therapist as well.  If symptoms worsen please return to ER.

## 2022-07-21 ENCOUNTER — Telehealth: Payer: Self-pay | Admitting: Cardiovascular Disease

## 2022-07-21 NOTE — Telephone Encounter (Signed)
Pt states she has a virus that has caused a cough. She states she also lost her husband last month and still grieving. She states she is concerned how much stress her heart has been in and it's hard for her to breath. Pt starts to cry, she states this is the most difficult thing she has had to go through. She would like to speak to nurse or Dr. Excell Seltzer if possible. Please advise.

## 2022-07-21 NOTE — Telephone Encounter (Signed)
Returned call to patient who states after much discussion, that she's having palpitations and dealing with a cold and states "I just feel like I'm going to die." She's very stressed after the loss of her husband. She wanted [place] to clarify that she can take claritin that her PCP recommended-advised that she can. Also told her to contact PCP for recommendation on mood/anti-depressant drug or therapy to help her during this difficult time. She agrees that she needs to.

## 2022-07-25 ENCOUNTER — Other Ambulatory Visit: Payer: Self-pay | Admitting: Internal Medicine

## 2022-07-25 DIAGNOSIS — E1151 Type 2 diabetes mellitus with diabetic peripheral angiopathy without gangrene: Secondary | ICD-10-CM

## 2022-07-26 ENCOUNTER — Ambulatory Visit (INDEPENDENT_AMBULATORY_CARE_PROVIDER_SITE_OTHER): Payer: Medicare Other

## 2022-07-26 DIAGNOSIS — Z7901 Long term (current) use of anticoagulants: Secondary | ICD-10-CM | POA: Diagnosis not present

## 2022-07-26 LAB — POCT INR: INR: 2.3 (ref 2.0–3.0)

## 2022-07-26 NOTE — Patient Instructions (Addendum)
Pre visit review using our clinic review tool, if applicable. No additional management support is needed unless otherwise documented below in the visit note.  Continue 1 tablet daily except take 1 1/2 tablets on Wednesdays.  Re-check in 4 weeks. 

## 2022-07-26 NOTE — Progress Notes (Signed)
Continue 1 tablet daily except take 1 1/2 tablets on Wednesdays.  Re-check in 4 weeks. 

## 2022-07-27 ENCOUNTER — Telehealth: Payer: Self-pay

## 2022-07-27 NOTE — Telephone Encounter (Signed)
Pt was in coumadin clinic yesterday and returned paperwork for financial assistance for [place]. She was returning it so the PCP could complete his portion of the paperwork and fax it to [facility]. A copy of all the paperwork was made and given to pt. Paperwork was brought to [employer], PCP CMA, to give to provider.

## 2022-07-27 NOTE — Telephone Encounter (Signed)
Forms completed and given to PCP to review and sign.  

## 2022-07-27 NOTE — Telephone Encounter (Signed)
Forms have been signed and faxed back to [facility].

## 2022-07-28 ENCOUNTER — Other Ambulatory Visit: Payer: Self-pay | Admitting: Internal Medicine

## 2022-07-28 DIAGNOSIS — F411 Generalized anxiety disorder: Secondary | ICD-10-CM

## 2022-08-03 DIAGNOSIS — M47816 Spondylosis without myelopathy or radiculopathy, lumbar region: Secondary | ICD-10-CM | POA: Diagnosis not present

## 2022-08-03 DIAGNOSIS — M150 Primary generalized (osteo)arthritis: Secondary | ICD-10-CM | POA: Diagnosis not present

## 2022-08-03 DIAGNOSIS — Z79891 Long term (current) use of opiate analgesic: Secondary | ICD-10-CM | POA: Diagnosis not present

## 2022-08-03 DIAGNOSIS — G894 Chronic pain syndrome: Secondary | ICD-10-CM | POA: Diagnosis not present

## 2022-08-05 NOTE — Telephone Encounter (Signed)
Pt coming in for OV on 08/09/22 @1 :20pm. Will fill out during her visit

## 2022-08-09 ENCOUNTER — Ambulatory Visit (INDEPENDENT_AMBULATORY_CARE_PROVIDER_SITE_OTHER): Payer: Medicare Other | Admitting: Internal Medicine

## 2022-08-09 ENCOUNTER — Encounter: Payer: Self-pay | Admitting: Internal Medicine

## 2022-08-09 ENCOUNTER — Ambulatory Visit: Payer: Medicare Other | Admitting: Internal Medicine

## 2022-08-09 DIAGNOSIS — I5042 Chronic combined systolic (congestive) and diastolic (congestive) heart failure: Secondary | ICD-10-CM

## 2022-08-09 DIAGNOSIS — E119 Type 2 diabetes mellitus without complications: Secondary | ICD-10-CM

## 2022-08-09 DIAGNOSIS — E1151 Type 2 diabetes mellitus with diabetic peripheral angiopathy without gangrene: Secondary | ICD-10-CM | POA: Diagnosis not present

## 2022-08-09 DIAGNOSIS — E785 Hyperlipidemia, unspecified: Secondary | ICD-10-CM | POA: Diagnosis not present

## 2022-08-09 DIAGNOSIS — Z794 Long term (current) use of insulin: Secondary | ICD-10-CM

## 2022-08-09 DIAGNOSIS — E1169 Type 2 diabetes mellitus with other specified complication: Secondary | ICD-10-CM | POA: Diagnosis not present

## 2022-08-09 NOTE — Progress Notes (Unsigned)
Subjective:  Patient ID: Kristen [name], female    DOB: October 25, 1945  Age: 77 y.o. MRN: [id number]  CC: No chief complaint on file.   HPI Kristen [name] presents for ***  Outpatient Medications Prior to Visit  Medication Sig Dispense Refill   ALPRAZolam (XANAX) 1 MG tablet TAKE ONE TABLET BY MOUTH THREE TIMES DAILY AS NEEDED 270 tablet 0   atorvastatin (LIPITOR) 40 MG tablet TAKE ONE TABLET BY MOUTH ONCE DAILY 90 tablet 1   benzonatate (TESSALON) 100 MG capsule Take 1 capsule (100 mg total) by mouth every 8 (eight) hours. 21 capsule 0   COMFORT EZ PEN NEEDLES 31G X 8 MM MISC USE WITH insulin AS DIRECTED 100 each 3   Continuous Blood Gluc Receiver (DEXCOM G7 RECEIVER) DEVI 1 Act by Does not apply route daily. 9 each 1   Continuous Blood Gluc Sensor (DEXCOM G7 SENSOR) MISC 1 Act by Does not apply route daily. 9 each 1   cyclobenzaprine (FLEXERIL) 10 MG tablet Take 10 mg by mouth 3 (three) times daily as needed.     diclofenac Sodium (VOLTAREN) 1 % GEL Apply 2 g topically 4 (four) times daily. (Patient taking differently: Apply 2 g topically 4 (four) times daily. As needed) 100 g 0   furosemide (LASIX) 20 MG tablet TAKE ONE TABLET BY MOUTH ONCE DAILY 90 tablet 3   gabapentin (NEURONTIN) 300 MG capsule TAKE ONE CAPSULE BY MOUTH THREE TIMES DAILY 270 capsule 1   insulin glargine, 2 Unit Dial, (TOUJEO MAX SOLOSTAR) 300 UNIT/ML Solostar Pen Inject 40 Units into the skin daily. 12 mL 1   JARDIANCE 25 MG TABS tablet TAKE ONE TABLET BY MOUTH ONCE DAILY 90 tablet 1   magnesium oxide (MAG-OX) 400 MG tablet TAKE ONE TABLET BY MOUTH TWICE DAILY 180 tablet 3   meclizine (ANTIVERT) 25 MG tablet Take 1 tablet (25 mg total) by mouth 3 (three) times daily as needed for dizziness. 270 tablet 0   metFORMIN (GLUCOPHAGE-XR) 500 MG 24 hr tablet TAKE THREE TABLETS BY MOUTH EVERY MORNING with breakfast 270 tablet 0   metoprolol succinate (TOPROL-XL) 100 MG 24 hr tablet TAKE ONE TABLET BY MOUTH EVERY  MORNING and TAKE ONE TABLET BY MOUTH EVERYDAY AT BEDTIME 180 tablet 3   nitroGLYCERIN (NITROSTAT) 0.4 MG SL tablet Place 1 tablet (0.4 mg total) under the tongue every 5 (five) minutes as needed for chest pain (x 3 doses). Reported on 05/08/2015 35 tablet 2   oxyCODONE-acetaminophen (PERCOCET) 10-325 MG tablet Take 1 tablet by mouth every 4 (four) hours as needed for pain.     polyethylene glycol (MIRALAX / GLYCOLAX) 17 g packet Take 17 g by mouth daily. (Patient taking differently: Take 17 g by mouth as needed.) 14 each 0   potassium chloride SA (KLOR-CON M) 20 MEQ tablet TAKE ONE TABLET BY MOUTH EVERY MORNING and TAKE ONE TABLET BY MOUTH EVERYDAY AT BEDTIME 180 tablet 2   Prucalopride Succinate (MOTEGRITY) 2 MG TABS Take 1 tablet (2 mg total) by mouth daily. 90 tablet 1   sacubitril-valsartan (ENTRESTO) 97-103 MG Take 1 tablet by mouth 2 (two) times daily. 180 tablet 3   Semaglutide (RYBELSUS) 7 MG TABS Take 1 tablet (7 mg total) by mouth daily. 90 tablet 1   spironolactone (ALDACTONE) 25 MG tablet Take 1 tablet (25 mg total) by mouth daily. 90 tablet 1   thiamine (VITAMIN B1) 100 MG tablet TAKE ONE TABLET BY MOUTH ONCE DAILY 90 tablet 1  warfarin (COUMADIN) 3 MG tablet TAKE ONE TABLET BY MOUTH DAILY EXCEPT TAKE 1 1/2 TABLETS ON MONDAYS OR AS DIRECTED BY ANTICOAGULATION CLINIC. 110 tablet 1   No facility-administered medications prior to visit.    ROS Review of Systems  Objective:  Ht 5' (1.524 m)   BMI 33.98 kg/m   BP Readings from Last 3 Encounters:  07/19/22 117/76  07/04/22 124/76  06/09/22 108/81    Wt Readings from Last 3 Encounters:  07/04/22 174 lb (78.9 kg)  06/09/22 180 lb (81.6 kg)  05/25/22 180 lb 12.8 oz (82 kg)    Physical Exam  Lab Results  Component Value Date   WBC 3.6 (L) 07/19/2022   HGB 11.7 (L) 07/19/2022   HCT 37.5 07/19/2022   PLT 245 07/19/2022   GLUCOSE 169 (H) 07/19/2022   CHOL 105 11/16/2021   TRIG 90.0 11/16/2021   HDL 41.00 11/16/2021    LDLDIRECT 116.0 11/28/2008   LDLCALC 46 11/16/2021   ALT 9 11/01/2021   AST 13 11/01/2021   NA 140 07/19/2022   K 4.2 07/19/2022   CL 106 07/19/2022   CREATININE 0.72 07/19/2022   BUN 13 07/19/2022   CO2 25 07/19/2022   TSH 2.42 07/04/2022   INR 2.3 07/26/2022   HGBA1C 8.7 (H) 07/04/2022   MICROALBUR <0.7 11/16/2021    DG Chest Port 1 View  Result Date: 07/19/2022 CLINICAL DATA:  Cough, history of CHF. EXAM: PORTABLE CHEST 1 VIEW COMPARISON:  Chest radiograph 07/04/2022. FINDINGS: Clear lungs. Stable cardiac and mediastinal contours. No pleural effusion or pneumothorax. Visualized bones and upper abdomen are unremarkable. IMPRESSION: No evidence of acute cardiopulmonary disease. Electronically Signed   By: Orvan Falconer M.D.   On: 07/19/2022 11:26    Assessment & Plan:  There are no diagnoses linked to this encounter.   Follow-up: No follow-ups on file.  Sanda Linger, MD

## 2022-08-10 MED ORDER — ATORVASTATIN CALCIUM 40 MG PO TABS
40.0000 mg | ORAL_TABLET | Freq: Every day | ORAL | 1 refills | Status: DC
Start: 2022-08-10 — End: 2023-01-27

## 2022-08-10 MED ORDER — JARDIANCE 25 MG PO TABS: 25.0000 mg | ORAL_TABLET | Freq: Every day | ORAL | 0 refills | Status: DC

## 2022-08-10 MED ORDER — TOUJEO MAX SOLOSTAR 300 UNIT/ML ~~LOC~~ SOPN
40.0000 [IU] | PEN_INJECTOR | Freq: Every day | SUBCUTANEOUS | 0 refills | Status: DC
Start: 2022-08-10 — End: 2022-10-20

## 2022-08-16 DIAGNOSIS — E119 Type 2 diabetes mellitus without complications: Secondary | ICD-10-CM | POA: Diagnosis not present

## 2022-08-16 DIAGNOSIS — H43813 Vitreous degeneration, bilateral: Secondary | ICD-10-CM | POA: Diagnosis not present

## 2022-08-16 DIAGNOSIS — H2513 Age-related nuclear cataract, bilateral: Secondary | ICD-10-CM | POA: Diagnosis not present

## 2022-08-16 DIAGNOSIS — H25013 Cortical age-related cataract, bilateral: Secondary | ICD-10-CM | POA: Diagnosis not present

## 2022-08-16 DIAGNOSIS — H52203 Unspecified astigmatism, bilateral: Secondary | ICD-10-CM | POA: Diagnosis not present

## 2022-08-16 DIAGNOSIS — H524 Presbyopia: Secondary | ICD-10-CM | POA: Diagnosis not present

## 2022-08-16 LAB — HM DIABETES EYE EXAM

## 2022-08-18 ENCOUNTER — Telehealth: Payer: Self-pay

## 2022-08-18 NOTE — Telephone Encounter (Signed)
Rec'd 1 box of Rybelsus at [facility]. Order placed in mail room to be sent over to [facility] at [facility] to Dr. Yetta Barre (via inner-office).   Refill form will be completed and updated to make sure next order is sent to correct office.

## 2022-08-19 NOTE — Telephone Encounter (Signed)
Medication has been received and we have called the pt for pick up.

## 2022-08-20 ENCOUNTER — Other Ambulatory Visit: Payer: Self-pay | Admitting: Internal Medicine

## 2022-08-20 DIAGNOSIS — E1151 Type 2 diabetes mellitus with diabetic peripheral angiopathy without gangrene: Secondary | ICD-10-CM

## 2022-08-23 ENCOUNTER — Telehealth: Payer: Self-pay | Admitting: Cardiovascular Disease

## 2022-08-23 ENCOUNTER — Ambulatory Visit: Payer: Medicare Other

## 2022-08-23 NOTE — Telephone Encounter (Signed)
  Pt c/o medication issue:  1. Name of Medication:   metoprolol succinate (TOPROL-XL) 100 MG 24 hr tablet    2. How are you currently taking this medication (dosage and times per day)? TAKE ONE TABLET BY MOUTH EVERY MORNING and TAKE ONE TABLET BY MOUTH EVERYDAY AT BEDTIME   3. Are you having a reaction (difficulty breathing--STAT)? No   4. What is your medication issue? Pt is calling, she said, she felt lightheaded this morning. Her HR 85 and o2 level is at 97%. She took a half a pill of xanax this morning and can't remember if she took ay of her heart meds this morning. She wants to know if she can take at least half a pill if her metoprolol

## 2022-08-24 NOTE — Telephone Encounter (Signed)
Returned call to patient who states she didn't take the morning dose of Metoprolol since she couldn't remember, but did take last night's dose. She did not have any repeat episodes of lightheadedness throughout the day yesterday and none upon waking this morning. She has no concerns at this point. Advised her to call back and monitor vitals if she feels anything like this again.

## 2022-08-26 ENCOUNTER — Ambulatory Visit (INDEPENDENT_AMBULATORY_CARE_PROVIDER_SITE_OTHER): Payer: Medicare Other

## 2022-08-26 DIAGNOSIS — Z7901 Long term (current) use of anticoagulants: Secondary | ICD-10-CM | POA: Diagnosis not present

## 2022-08-26 LAB — POCT INR: INR: 1.6 — AB (ref 2.0–3.0)

## 2022-08-26 NOTE — Patient Instructions (Addendum)
Pre visit review using our clinic review tool, if applicable. No additional management support is needed unless otherwise documented below in the visit note.  Increase dose today to take 1 1/2 tablets and increase dose tomorrow to take 1 1/2 tablets and then continue 1 tablet daily except take 1 1/2 tablets on Wednesdays. Recheck in 3 weeks.

## 2022-08-26 NOTE — Progress Notes (Signed)
Pt reports appetite has not been good but she has been eating vitamin K containing foods.  Increase dose today to take 1 1/2 tablets and increase dose tomorrow to take 1 1/2 tablets and then continue 1 tablet daily except take 1 1/2 tablets on Wednesdays. Recheck in 3 weeks.

## 2022-08-30 ENCOUNTER — Telehealth: Payer: Self-pay | Admitting: Internal Medicine

## 2022-08-30 NOTE — Telephone Encounter (Signed)
Kristen [name] from medication assistance called and said she received the fax sent over. However, the fax is too dark to read. She would like to know if it can be re-faxed. Best callback is [phone].

## 2022-08-30 NOTE — Telephone Encounter (Signed)
Fax has been sent again

## 2022-08-31 DIAGNOSIS — Z79891 Long term (current) use of opiate analgesic: Secondary | ICD-10-CM | POA: Diagnosis not present

## 2022-08-31 DIAGNOSIS — G894 Chronic pain syndrome: Secondary | ICD-10-CM | POA: Diagnosis not present

## 2022-08-31 DIAGNOSIS — M150 Primary generalized (osteo)arthritis: Secondary | ICD-10-CM | POA: Diagnosis not present

## 2022-08-31 DIAGNOSIS — M25511 Pain in right shoulder: Secondary | ICD-10-CM | POA: Diagnosis not present

## 2022-09-16 ENCOUNTER — Ambulatory Visit: Payer: Medicare Other

## 2022-09-16 DIAGNOSIS — Z7901 Long term (current) use of anticoagulants: Secondary | ICD-10-CM | POA: Diagnosis not present

## 2022-09-16 DIAGNOSIS — D510 Vitamin B12 deficiency anemia due to intrinsic factor deficiency: Secondary | ICD-10-CM | POA: Diagnosis not present

## 2022-09-16 LAB — POCT INR: INR: 2.3 (ref 2.0–3.0)

## 2022-09-16 MED ORDER — CYANOCOBALAMIN 1000 MCG/ML IJ SOLN
1000.0000 ug | Freq: Once | INTRAMUSCULAR | Status: AC
Start: 2022-09-16 — End: 2022-09-16
  Administered 2022-09-16: 1000 ug via INTRAMUSCULAR

## 2022-09-16 NOTE — Progress Notes (Signed)
Continue 1 tablet daily except take 1 1/2 tablets on Wednesdays. Recheck in 5 weeks.  Per orders of Jiles Prows, NP in Dr. Yetta Barre absence, injection of B12 given  in L deltoid by Sherrie George, RN.  Pt also due for B12 injection. Administered B12 injection in L deltoid. Pt tolerated well.

## 2022-09-16 NOTE — Patient Instructions (Addendum)
Pre visit review using our clinic review tool, if applicable. No additional management support is needed unless otherwise documented below in the visit note.  Continue 1 tablet daily except take 1 1/2 tablets on Wednesdays. Recheck in 5 weeks.

## 2022-09-20 NOTE — Telephone Encounter (Signed)
Faxed clear copy to office for signature 08/31/22 and 09/20/22.

## 2022-09-26 ENCOUNTER — Telehealth: Payer: Self-pay | Admitting: Internal Medicine

## 2022-09-26 NOTE — Telephone Encounter (Signed)
Pt wanted to know if she can get samples for Semaglutide (RYBELSUS) 7 MG TABS.

## 2022-09-29 ENCOUNTER — Other Ambulatory Visit: Payer: Self-pay | Admitting: Internal Medicine

## 2022-09-29 DIAGNOSIS — G894 Chronic pain syndrome: Secondary | ICD-10-CM | POA: Diagnosis not present

## 2022-09-29 DIAGNOSIS — M25511 Pain in right shoulder: Secondary | ICD-10-CM | POA: Diagnosis not present

## 2022-09-29 DIAGNOSIS — Z79891 Long term (current) use of opiate analgesic: Secondary | ICD-10-CM | POA: Diagnosis not present

## 2022-09-29 DIAGNOSIS — M150 Primary generalized (osteo)arthritis: Secondary | ICD-10-CM | POA: Diagnosis not present

## 2022-09-29 DIAGNOSIS — E1151 Type 2 diabetes mellitus with diabetic peripheral angiopathy without gangrene: Secondary | ICD-10-CM

## 2022-09-29 NOTE — Telephone Encounter (Signed)
Patient told pharmacy that it should not be the extended release - please resend regular metformin

## 2022-10-02 ENCOUNTER — Other Ambulatory Visit: Payer: Self-pay | Admitting: Internal Medicine

## 2022-10-02 ENCOUNTER — Other Ambulatory Visit: Payer: Self-pay | Admitting: Cardiovascular Disease

## 2022-10-02 DIAGNOSIS — E1151 Type 2 diabetes mellitus with diabetic peripheral angiopathy without gangrene: Secondary | ICD-10-CM

## 2022-10-02 DIAGNOSIS — M797 Fibromyalgia: Secondary | ICD-10-CM

## 2022-10-05 ENCOUNTER — Encounter (INDEPENDENT_AMBULATORY_CARE_PROVIDER_SITE_OTHER): Payer: Self-pay

## 2022-10-10 ENCOUNTER — Telehealth: Payer: Self-pay

## 2022-10-10 DIAGNOSIS — Z7901 Long term (current) use of anticoagulants: Secondary | ICD-10-CM

## 2022-10-10 MED ORDER — WARFARIN SODIUM 3 MG PO TABS
ORAL_TABLET | ORAL | 1 refills | Status: DC
Start: 2022-10-10 — End: 2023-01-12

## 2022-10-10 NOTE — Telephone Encounter (Signed)
Pt LVM yesterday reporting she will be out of warfarin today. She requested a call to [facility] to let them know she needs it delivered today.   Pt is compliant with warfarin management and PCP apts.  Sent in refill of warfarin to requested pharmacy.    LVM for [facility] that pt needs refilled and delivered today.   Contacted pt and advised script was sent and VM was left for pharmacy. Advised if any problems to contact the coumadin clinic. Pt verbalized understanding.

## 2022-10-17 ENCOUNTER — Telehealth: Payer: Self-pay | Admitting: Internal Medicine

## 2022-10-17 ENCOUNTER — Ambulatory Visit: Payer: Medicare Other | Admitting: Cardiovascular Disease

## 2022-10-17 NOTE — Telephone Encounter (Signed)
Pt called stating she having issue with urinating and she think she is taking too many medication can a nurse reach out to pt and speak with her about this situation.

## 2022-10-17 NOTE — Telephone Encounter (Signed)
Pt has been scheduled for 8/15 @ 2.20pm to discuss

## 2022-10-20 ENCOUNTER — Encounter: Payer: Self-pay | Admitting: Internal Medicine

## 2022-10-20 ENCOUNTER — Ambulatory Visit: Payer: Medicare Other | Admitting: Internal Medicine

## 2022-10-20 VITALS — BP 120/78 | HR 82 | Temp 97.8°F | Ht 60.0 in | Wt 179.0 lb

## 2022-10-20 DIAGNOSIS — E1159 Type 2 diabetes mellitus with other circulatory complications: Secondary | ICD-10-CM

## 2022-10-20 DIAGNOSIS — B9689 Other specified bacterial agents as the cause of diseases classified elsewhere: Secondary | ICD-10-CM | POA: Diagnosis not present

## 2022-10-20 DIAGNOSIS — Z794 Long term (current) use of insulin: Secondary | ICD-10-CM | POA: Diagnosis not present

## 2022-10-20 DIAGNOSIS — E039 Hypothyroidism, unspecified: Secondary | ICD-10-CM

## 2022-10-20 DIAGNOSIS — R350 Frequency of micturition: Secondary | ICD-10-CM

## 2022-10-20 DIAGNOSIS — D510 Vitamin B12 deficiency anemia due to intrinsic factor deficiency: Secondary | ICD-10-CM

## 2022-10-20 DIAGNOSIS — N390 Urinary tract infection, site not specified: Secondary | ICD-10-CM

## 2022-10-20 DIAGNOSIS — N1831 Chronic kidney disease, stage 3a: Secondary | ICD-10-CM

## 2022-10-20 DIAGNOSIS — E1151 Type 2 diabetes mellitus with diabetic peripheral angiopathy without gangrene: Secondary | ICD-10-CM

## 2022-10-20 DIAGNOSIS — I152 Hypertension secondary to endocrine disorders: Secondary | ICD-10-CM | POA: Diagnosis not present

## 2022-10-20 DIAGNOSIS — E119 Type 2 diabetes mellitus without complications: Secondary | ICD-10-CM

## 2022-10-20 LAB — MICROALBUMIN / CREATININE URINE RATIO
Creatinine,U: 42.5 mg/dL
Microalb Creat Ratio: 1.6 mg/g (ref 0.0–30.0)
Microalb, Ur: <0.7 mg/dL (ref 0.0–1.9)

## 2022-10-20 LAB — CBC WITH DIFFERENTIAL/PLATELET
Basophils Absolute: 0 10*3/uL (ref 0.0–0.1)
Basophils Relative: 0.6 % (ref 0.0–3.0)
Eosinophils Absolute: 0 10*3/uL (ref 0.0–0.7)
Eosinophils Relative: 1 % (ref 0.0–5.0)
HCT: 40.2 % (ref 36.0–46.0)
Hemoglobin: 12.6 g/dL (ref 12.0–15.0)
Lymphocytes Relative: 43.1 % (ref 12.0–46.0)
Lymphs Abs: 2.2 10*3/uL (ref 0.7–4.0)
MCHC: 31.3 g/dL (ref 30.0–36.0)
MCV: 85.1 fl (ref 78.0–100.0)
Monocytes Absolute: 0.4 10*3/uL (ref 0.1–1.0)
Monocytes Relative: 8.3 % (ref 3.0–12.0)
Neutro Abs: 2.3 10*3/uL (ref 1.4–7.7)
Neutrophils Relative %: 47 % (ref 43.0–77.0)
Platelets: 236 10*3/uL (ref 150.0–400.0)
RBC: 4.72 Mil/uL (ref 3.87–5.11)
RDW: 16.7 % — ABNORMAL HIGH (ref 11.5–15.5)
WBC: 5 10*3/uL (ref 4.0–10.5)

## 2022-10-20 LAB — BASIC METABOLIC PANEL
BUN: 13 mg/dL (ref 6–23)
CO2: 22 mEq/L (ref 19–32)
Calcium: 9.9 mg/dL (ref 8.4–10.5)
Chloride: 102 mEq/L (ref 96–112)
Creatinine, Ser: 0.76 mg/dL (ref 0.40–1.20)
GFR: 76.98 mL/min (ref >60.00)
Glucose, Bld: 219 mg/dL — ABNORMAL HIGH (ref 70–99)
Potassium: 4.2 mEq/L (ref 3.5–5.1)
Sodium: 135 mEq/L (ref 135–145)

## 2022-10-20 LAB — URINALYSIS, ROUTINE W REFLEX MICROSCOPIC
Bilirubin Urine: NEGATIVE
Hgb urine dipstick: NEGATIVE
Ketones, ur: NEGATIVE
Leukocytes,Ua: NEGATIVE
Nitrite: NEGATIVE
RBC / HPF: NONE SEEN (ref 0–?)
Specific Gravity, Urine: 1.01 (ref 1.000–1.030)
Total Protein, Urine: NEGATIVE
Urine Glucose: >=1000 — AB
Urobilinogen, UA: 0.2 (ref 0.0–1.0)
pH: 6 (ref 5.0–8.0)

## 2022-10-20 LAB — HEMOGLOBIN A1C: Hgb A1c MFr Bld: 9.8 % — ABNORMAL HIGH (ref 4.6–6.5)

## 2022-10-20 LAB — TSH: TSH: 1.32 u[IU]/mL (ref 0.35–5.50)

## 2022-10-20 LAB — FOLATE: Folate: 17.3 ng/mL (ref >5.9)

## 2022-10-20 MED ORDER — DEXCOM G7 RECEIVER DEVI
1.0000 | Freq: Every day | 1 refills | Status: DC
Start: 2022-10-20 — End: 2023-03-15

## 2022-10-20 MED ORDER — DEXCOM G7 SENSOR MISC: 1.0000 | Freq: Every day | 1 refills | Status: DC

## 2022-10-20 MED ORDER — CYANOCOBALAMIN 1000 MCG/ML IJ SOLN
1000.0000 ug | Freq: Once | INTRAMUSCULAR | Status: AC
Start: 2022-10-20 — End: 2022-10-20
  Administered 2022-10-20: 1000 ug via INTRAMUSCULAR

## 2022-10-20 MED ORDER — TOUJEO MAX SOLOSTAR 300 UNIT/ML ~~LOC~~ SOPN
50.0000 [IU] | PEN_INJECTOR | Freq: Every day | SUBCUTANEOUS | 0 refills | Status: DC
Start: 2022-10-20 — End: 2023-03-15

## 2022-10-20 NOTE — Progress Notes (Unsigned)
Subjective:  Patient ID: Kristen [name], female    DOB: August 26, 1945  Age: 77 y.o. MRN: [id number]  CC: Diabetes   HPI Kristen [name] presents for f/up -----  Discussed the use of AI scribe software for clinical note transcription with the patient, who gave verbal consent to proceed.  History of Present Illness   The patient, with a history of heart failure and neuropathy, presents with a new onset of urinary incontinence for the past week. They report a sudden loss of control over urination, particularly when standing up to go to the bathroom. The patient denies any associated pain or symptoms suggestive of a urinary tract infection.  In addition, the patient experiences vertigo when turning their head to the right while sleeping. This symptom has been ongoing for an unspecified duration.  The patient also reports symptoms of neuropathy, describing a sensation of discomfort in their toes, as if their socks are 'ragged inside.' This discomfort does not appear to affect their ability to walk or use their feet, which is primarily limited by knee issues.  The patient also mentions concerns about living independently due to difficulties with mobility and self-care, particularly cleaning their back and fear of falling. They have previously fallen twice, requiring assistance to get up.  The patient denies any recent chest pain, shortness of breath, or irregular heartbeats. They also deny any new troubles with numbness, weakness, tingling, or slurred speech. However, they report difficulty with reading, despite a recent eye examination that did not reveal any significant issues.       Outpatient Medications Prior to Visit  Medication Sig Dispense Refill   ALPRAZolam (XANAX) 1 MG tablet TAKE ONE TABLET BY MOUTH THREE TIMES DAILY AS NEEDED 270 tablet 0   atorvastatin (LIPITOR) 40 MG tablet Take 1 tablet (40 mg total) by mouth daily. 90 tablet 1   COMFORT EZ PEN NEEDLES 31G X 8 MM MISC  USE WITH insulin AS DIRECTED 100 each 3   cyclobenzaprine (FLEXERIL) 10 MG tablet Take 10 mg by mouth 3 (three) times daily as needed.     diclofenac Sodium (VOLTAREN) 1 % GEL Apply 2 g topically 4 (four) times daily. (Patient taking differently: Apply 2 g topically 4 (four) times daily. As needed) 100 g 0   furosemide (LASIX) 20 MG tablet TAKE ONE TABLET BY MOUTH ONCE DAILY 90 tablet 3   gabapentin (NEURONTIN) 300 MG capsule TAKE ONE CAPSULE BY MOUTH THREE TIMES DAILY 270 capsule 1   JARDIANCE 25 MG TABS tablet Take 1 tablet (25 mg total) by mouth daily. 90 tablet 0   magnesium oxide (MAG-OX) 400 MG tablet TAKE ONE TABLET BY MOUTH TWICE DAILY 180 tablet 3   metFORMIN (GLUCOPHAGE-XR) 500 MG 24 hr tablet TAKE THREE TABLETS BY MOUTH EVERY MORNING with BREAKFAST 270 tablet 0   metoprolol succinate (TOPROL-XL) 100 MG 24 hr tablet TAKE ONE TABLET BY MOUTH EVERY MORNING and TAKE ONE TABLET BY MOUTH EVERYDAY AT BEDTIME 180 tablet 3   nitroGLYCERIN (NITROSTAT) 0.4 MG SL tablet Place 1 tablet (0.4 mg total) under the tongue every 5 (five) minutes as needed for chest pain (x 3 doses). Reported on 05/08/2015 35 tablet 2   oxyCODONE-acetaminophen (PERCOCET) 10-325 MG tablet Take 1 tablet by mouth every 4 (four) hours as needed for pain.     polyethylene glycol (MIRALAX / GLYCOLAX) 17 g packet Take 17 g by mouth daily. (Patient taking differently: Take 17 g by mouth as needed.) 14 each 0  potassium chloride SA (KLOR-CON M) 20 MEQ tablet TAKE ONE TABLET BY MOUTH EVERY MORNING and TAKE ONE TABLET BY MOUTH EVERYDAY AT BEDTIME 180 tablet 2   sacubitril-valsartan (ENTRESTO) 97-103 MG Take 1 tablet by mouth 2 (two) times daily. 180 tablet 3   Semaglutide (RYBELSUS) 7 MG TABS Take 1 tablet (7 mg total) by mouth daily. 90 tablet 1   spironolactone (ALDACTONE) 25 MG tablet Take 1 tablet (25 mg total) by mouth daily. 90 tablet 1   thiamine (VITAMIN B1) 100 MG tablet TAKE ONE TABLET BY MOUTH ONCE DAILY 90 tablet 1    warfarin (COUMADIN) 3 MG tablet TAKE ONE TABLET BY MOUTH DAILY EXCEPT TAKE 1 1/2 TABLETS ON WEDNESDAYS OR AS DIRECTED BY ANTICOAGULATION CLINIC. 110 tablet 1   Continuous Blood Gluc Receiver (DEXCOM G7 RECEIVER) DEVI 1 Act by Does not apply route daily. 9 each 1   Continuous Blood Gluc Sensor (DEXCOM G7 SENSOR) MISC 1 Act by Does not apply route daily. 9 each 1   insulin glargine, 2 Unit Dial, (TOUJEO MAX SOLOSTAR) 300 UNIT/ML Solostar Pen Inject 40 Units into the skin daily. 12 mL 0   meclizine (ANTIVERT) 25 MG tablet Take 1 tablet (25 mg total) by mouth 3 (three) times daily as needed for dizziness. 270 tablet 0   Prucalopride Succinate (MOTEGRITY) 2 MG TABS Take 1 tablet (2 mg total) by mouth daily. 90 tablet 1   No facility-administered medications prior to visit.    ROS Review of Systems  Objective:  BP 120/78 (BP Location: Left Arm, Patient Position: Sitting, Cuff Size: Large)   Pulse 82   Temp 97.8 F (36.6 C) (Oral)   Ht 5' (1.524 m)   Wt 179 lb (81.2 kg)   SpO2 95%   BMI 34.96 kg/m   BP Readings from Last 3 Encounters:  10/20/22 120/78  08/09/22 118/78  07/19/22 117/76    Wt Readings from Last 3 Encounters:  10/20/22 179 lb (81.2 kg)  08/09/22 176 lb (79.8 kg)  07/04/22 174 lb (78.9 kg)    Physical Exam  Lab Results  Component Value Date   WBC 5.0 10/20/2022   HGB 12.6 10/20/2022   HCT 40.2 10/20/2022   PLT 236.0 10/20/2022   GLUCOSE 219 (H) 10/20/2022   CHOL 105 11/16/2021   TRIG 90.0 11/16/2021   HDL 41.00 11/16/2021   LDLDIRECT 116.0 11/28/2008   LDLCALC 46 11/16/2021   ALT 9 11/01/2021   AST 13 11/01/2021   NA 135 10/20/2022   K 4.2 10/20/2022   CL 102 10/20/2022   CREATININE 0.76 10/20/2022   BUN 13 10/20/2022   CO2 22 10/20/2022   TSH 1.32 10/20/2022   INR 2.3 09/16/2022   HGBA1C 9.8 (H) 10/20/2022   MICROALBUR <0.7 10/20/2022    DG Chest Port 1 View  Result Date: 07/19/2022 CLINICAL DATA:  Cough, history of CHF. EXAM: PORTABLE CHEST 1  VIEW COMPARISON:  Chest radiograph 07/04/2022. FINDINGS: Clear lungs. Stable cardiac and mediastinal contours. No pleural effusion or pneumothorax. Visualized bones and upper abdomen are unremarkable. IMPRESSION: No evidence of acute cardiopulmonary disease. Electronically Signed   By: Orvan Falconer M.D.   On: 07/19/2022 11:26    Assessment & Plan:  Acquired hypothyroidism -     TSH; Future  Vitamin B12 deficiency anemia due to intrinsic factor deficiency -     CBC with Differential/Platelet; Future -     Folate; Future -     Cyanocobalamin  Insulin-requiring or dependent type II  diabetes mellitus (HCC) -     Microalbumin / creatinine urine ratio; Future -     Hemoglobin A1c; Future -     Basic metabolic panel; Future -     Dexcom G7 Receiver; 1 Act by Does not apply route daily.  Dispense: 9 each; Refill: 1 -     Dexcom G7 Sensor; 1 Act by Does not apply route daily.  Dispense: 9 each; Refill: 1 -     Toujeo Max SoloStar; Inject 50 Units into the skin daily.  Dispense: 15 mL; Refill: 0  Hypertension associated with diabetes (HCC) -     Urinalysis, Routine w reflex microscopic; Future -     TSH; Future -     Basic metabolic panel; Future  DM (diabetes mellitus), type 2 with peripheral vascular complications (HCC)  Stage 3a chronic kidney disease (HCC)  Urine frequency -     CULTURE, URINE COMPREHENSIVE; Future     Follow-up: No follow-ups on file.  Sanda Linger, MD

## 2022-10-21 ENCOUNTER — Ambulatory Visit: Payer: Medicare Other

## 2022-10-21 ENCOUNTER — Telehealth: Payer: Self-pay

## 2022-10-21 NOTE — Telephone Encounter (Signed)
Pt called to RS her coumadin clinic apt because she reports she is in pain. RS apt for next week. Advised if any changes to contact the coumadin clinic. Pt verbalized understanding.

## 2022-10-22 LAB — CULTURE, URINE COMPREHENSIVE

## 2022-10-23 ENCOUNTER — Other Ambulatory Visit: Payer: Self-pay | Admitting: Internal Medicine

## 2022-10-23 DIAGNOSIS — B9689 Other specified bacterial agents as the cause of diseases classified elsewhere: Secondary | ICD-10-CM | POA: Insufficient documentation

## 2022-10-23 DIAGNOSIS — N390 Urinary tract infection, site not specified: Secondary | ICD-10-CM | POA: Insufficient documentation

## 2022-10-23 MED ORDER — CIPROFLOXACIN HCL 250 MG PO TABS
250.0000 mg | ORAL_TABLET | Freq: Two times a day (BID) | ORAL | 0 refills | Status: DC
Start: 2022-10-23 — End: 2022-10-23

## 2022-10-25 ENCOUNTER — Ambulatory Visit (INDEPENDENT_AMBULATORY_CARE_PROVIDER_SITE_OTHER): Payer: Medicare Other

## 2022-10-25 DIAGNOSIS — Z7901 Long term (current) use of anticoagulants: Secondary | ICD-10-CM | POA: Diagnosis not present

## 2022-10-25 LAB — POCT INR: INR: 3.2 — AB (ref 2.0–3.0)

## 2022-10-25 NOTE — Patient Instructions (Addendum)
Pre visit review using our clinic review tool, if applicable. No additional management support is needed unless otherwise documented below in the visit note.  Hold dose today and then continue 1 tablet daily except take 1 1/2 tablets on Wednesdays. Recheck in 3 weeks.

## 2022-10-25 NOTE — Progress Notes (Signed)
Pt reports she has not been eating vitamin K rich foods as she normally does. Eat some greens today. Hold dose today and then continue 1 tablet daily except take 1 1/2 tablets on Wednesdays. Recheck in 3 weeks.  Last B12 injection given at OV on 8/15.

## 2022-10-28 ENCOUNTER — Other Ambulatory Visit: Payer: Self-pay | Admitting: Internal Medicine

## 2022-10-28 DIAGNOSIS — E1151 Type 2 diabetes mellitus with diabetic peripheral angiopathy without gangrene: Secondary | ICD-10-CM

## 2022-10-28 DIAGNOSIS — I5042 Chronic combined systolic (congestive) and diastolic (congestive) heart failure: Secondary | ICD-10-CM

## 2022-10-31 DIAGNOSIS — G894 Chronic pain syndrome: Secondary | ICD-10-CM | POA: Diagnosis not present

## 2022-10-31 DIAGNOSIS — M150 Primary generalized (osteo)arthritis: Secondary | ICD-10-CM | POA: Diagnosis not present

## 2022-10-31 DIAGNOSIS — Z79891 Long term (current) use of opiate analgesic: Secondary | ICD-10-CM | POA: Diagnosis not present

## 2022-10-31 DIAGNOSIS — M25511 Pain in right shoulder: Secondary | ICD-10-CM | POA: Diagnosis not present

## 2022-11-04 ENCOUNTER — Telehealth: Payer: Self-pay | Admitting: Internal Medicine

## 2022-11-04 DIAGNOSIS — F411 Generalized anxiety disorder: Secondary | ICD-10-CM

## 2022-11-04 MED ORDER — ALPRAZOLAM 1 MG PO TABS: 1.0000 mg | ORAL_TABLET | Freq: Three times a day (TID) | ORAL | 0 refills | Status: DC | PRN

## 2022-11-04 NOTE — Telephone Encounter (Signed)
Prescription Request  11/04/2022  LOV: 10/20/2022  What is the name of the medication or equipment?  ALPRAZolam Prudy Feeler) 1 MG tablet   Have you contacted your pharmacy to request a refill? Yes   Which pharmacy would you like this sent to?  [facility] - [address], [place] - [address] [place] [address] Phone: [phone] Fax: [phone]    Patient notified that their request is being sent to the clinical staff for review and that they should receive a response within 2 business days.   Please advise at Mobile [phone] (mobile)

## 2022-11-04 NOTE — Telephone Encounter (Signed)
Done erx 

## 2022-11-07 ENCOUNTER — Other Ambulatory Visit: Payer: Self-pay | Admitting: Internal Medicine

## 2022-11-07 DIAGNOSIS — E1151 Type 2 diabetes mellitus with diabetic peripheral angiopathy without gangrene: Secondary | ICD-10-CM

## 2022-11-15 ENCOUNTER — Ambulatory Visit (INDEPENDENT_AMBULATORY_CARE_PROVIDER_SITE_OTHER): Payer: Medicare Other

## 2022-11-15 DIAGNOSIS — Z7901 Long term (current) use of anticoagulants: Secondary | ICD-10-CM | POA: Diagnosis not present

## 2022-11-15 LAB — POCT INR: INR: 2.4 (ref 2.0–3.0)

## 2022-11-15 NOTE — Patient Instructions (Addendum)
Pre visit review using our clinic review tool, if applicable. No additional management support is needed unless otherwise documented below in the visit note.  Continue 1 tablet daily except take 1 1/2 tablets on Wednesdays. Recheck in 3 weeks.

## 2022-11-15 NOTE — Progress Notes (Signed)
Continue 1 tablet daily except take 1 1/2 tablets on Wednesdays. Recheck in 3 weeks.  Pt due for B12 after 9/15. Last B12 injection given at OV on 8/15.

## 2022-11-28 ENCOUNTER — Other Ambulatory Visit (HOSPITAL_COMMUNITY): Payer: Self-pay

## 2022-11-28 ENCOUNTER — Ambulatory Visit: Payer: Medicare Other | Admitting: Nurse Practitioner

## 2022-11-28 DIAGNOSIS — Z79891 Long term (current) use of opiate analgesic: Secondary | ICD-10-CM | POA: Diagnosis not present

## 2022-11-28 DIAGNOSIS — G894 Chronic pain syndrome: Secondary | ICD-10-CM | POA: Diagnosis not present

## 2022-11-28 DIAGNOSIS — M25511 Pain in right shoulder: Secondary | ICD-10-CM | POA: Diagnosis not present

## 2022-11-28 DIAGNOSIS — M150 Primary generalized (osteo)arthritis: Secondary | ICD-10-CM | POA: Diagnosis not present

## 2022-11-28 MED ORDER — OXYCODONE-ACETAMINOPHEN 10-325 MG PO TABS
1.0000 | ORAL_TABLET | Freq: Every day | ORAL | 0 refills | Status: DC | PRN
  Filled 2022-11-28 – 2022-12-05 (×2): qty 150, 30d supply, fill #0

## 2022-11-29 NOTE — Progress Notes (Deleted)
Cardiology Office Note    Patient Name: Kristen [name] Date of Encounter: 11/29/2022  Primary Care Provider:  Etta Grandchild, MD Primary Cardiologist:  Tonny Bollman, MD Primary Electrophysiologist: None   Past Medical History    Past Medical History:  Diagnosis Date   Arthritis    Blood transfusion without reported diagnosis    Bursitis    CAD (coronary artery disease)    a. s/p CABG 2004.b. stable cath 2014 demonstrating stable CAD and continued patency of her LIMA graft.   Chronic anticoagulation    on coumadin   DDD (degenerative disc disease), lumbar    Depression    Diabetes mellitus    Diastolic dysfunction    per echo in October 2012 with EF 50 to 55%   Fibromyalgia    GERD (gastroesophageal reflux disease) 10/23/2003   Headache(784.0)    Hyperlipidemia    Hypertension    Hypokalemia    LBBB (left bundle branch block)    Lumbar back pain    LV dysfunction    a. EF 45% by cath 2014. b. EF 50-55% by technically difficult echo in 08/2014.   Lymphadenitis    Morbid obesity (HCC)    a. Sleep study negative for significant OSA in 11/2014.   PAF (paroxysmal atrial fibrillation) (HCC)    Stroke [facility]) 2004   affected speech per pt    History of Present Illness   Kristen [name] is a 77 y.o. female with PMH of CAD s/p CABG in 2004, PAF (on warfarin), CVA, obesity, HTN, LBBB, chronic combined CHF, IDDM type II, HLD who presents today for 61-month follow-up  [name] has been followed by Dr. Excell Seltzer since 2021 for management of coronary artery disease.  She completed a TTE in 2023 showing reduced EF of 25-30% and was referred to Pharm.D. for GDMT and titration of CHF medication.  R/LHC that showed normal coronaries and EF normalized to 50-55% following GDMT.  She was last seen on 05/2022 and BP was well-controlled however weight was increased 4 to 5 pounds.  She was suffering a UTI and had held Lasix.  She denied any shortness of breath on exertion and was advised  to restart Lasix as needed once UTI resolves.  She was seen recently for UTI by her PCP and was seen in the ED in May for URI that resolved.  During today's visit the patient reports*** .  Patient denies chest pain, palpitations, dyspnea, PND, orthopnea, nausea, vomiting, dizziness, syncope, edema, weight gain, or early satiety.  ***Notes: -Last ischemic evaluation: -Last echo: -Interim ED visits: Review of Systems  Please see the history of present illness.    All other systems reviewed and are otherwise negative except as noted above.  Physical Exam    Wt Readings from Last 3 Encounters:  10/20/22 179 lb (81.2 kg)  08/09/22 176 lb (79.8 kg)  07/04/22 174 lb (78.9 kg)   AO:ZHYQM were no vitals filed for this visit.,There is no height or weight on file to calculate BMI. GEN: Well nourished, well developed in no acute distress Neck: No JVD; No carotid bruits Pulmonary: Clear to auscultation without rales, wheezing or rhonchi  Cardiovascular: Normal rate. Regular rhythm. Normal S1. Normal S2.   Murmurs: There is no murmur.  ABDOMEN: Soft, non-tender, non-distended EXTREMITIES:  No edema; No deformity   EKG/LABS/ Recent Cardiac Studies   ECG personally reviewed by me today - ***  Risk Assessment/Calculations:   {Does this patient have ATRIAL FIBRILLATION?:[phone]}  Lab Results  Component Value Date   WBC 5.0 10/20/2022   HGB 12.6 10/20/2022   HCT 40.2 10/20/2022   MCV 85.1 10/20/2022   PLT 236.0 10/20/2022   Lab Results  Component Value Date   CREATININE 0.76 10/20/2022   BUN 13 10/20/2022   NA 135 10/20/2022   K 4.2 10/20/2022   CL 102 10/20/2022   CO2 22 10/20/2022   Lab Results  Component Value Date   CHOL 105 11/16/2021   HDL 41.00 11/16/2021   LDLCALC 46 11/16/2021   LDLDIRECT 116.0 11/28/2008   TRIG 90.0 11/16/2021   CHOLHDL 3 11/16/2021    Lab Results  Component Value Date   HGBA1C 9.8 (H) 10/20/2022   Assessment & Plan    1.Chronic  combined CHF: -2D echo completed in 04/2021 EF of 25-30% down from 50-55% in 2016.  GDMT was titrated and most recent 2D echo shows normal EF of 50-55% -Today patient reports***  2.Paroxysmal atrial fibrillation: -Patient is currently  3.Coronary artery disease: -s/p CABG in 2004 with right and left heart cath completed 11/2021 showing normal coronaries and heart pressures. -Today patient reports ***  4.Essential hypertension: -Patient's most recent blood pressure is  5. CKD stage III: -Patient's last creatinine was 0.86  6.  DM type II: -Patient's last hemoglobin A1c was***     Disposition: Follow-up with Tonny Bollman, MD or APP in *** months {Are you ordering a CV Procedure (e.g. stress test, cath, DCCV, TEE, etc)?   Press F2        :[id number]}   Signed, Napoleon Form, Leodis Rains, NP 11/29/2022, 11:01 AM [facility]

## 2022-11-30 ENCOUNTER — Other Ambulatory Visit: Payer: Self-pay | Admitting: Internal Medicine

## 2022-11-30 ENCOUNTER — Ambulatory Visit: Payer: Medicare Other | Admitting: Nurse Practitioner

## 2022-11-30 ENCOUNTER — Other Ambulatory Visit (HOSPITAL_COMMUNITY): Payer: Self-pay

## 2022-11-30 ENCOUNTER — Telehealth: Payer: Self-pay | Admitting: Internal Medicine

## 2022-11-30 DIAGNOSIS — I480 Paroxysmal atrial fibrillation: Secondary | ICD-10-CM

## 2022-11-30 DIAGNOSIS — E119 Type 2 diabetes mellitus without complications: Secondary | ICD-10-CM

## 2022-11-30 DIAGNOSIS — I5042 Chronic combined systolic (congestive) and diastolic (congestive) heart failure: Secondary | ICD-10-CM

## 2022-11-30 DIAGNOSIS — N1831 Chronic kidney disease, stage 3a: Secondary | ICD-10-CM

## 2022-11-30 DIAGNOSIS — I25119 Atherosclerotic heart disease of native coronary artery with unspecified angina pectoris: Secondary | ICD-10-CM

## 2022-11-30 DIAGNOSIS — F411 Generalized anxiety disorder: Secondary | ICD-10-CM

## 2022-11-30 DIAGNOSIS — E1159 Type 2 diabetes mellitus with other circulatory complications: Secondary | ICD-10-CM

## 2022-11-30 MED ORDER — ALPRAZOLAM 1 MG PO TABS
1.0000 mg | ORAL_TABLET | Freq: Three times a day (TID) | ORAL | 0 refills | Status: DC | PRN
Start: 2022-11-30 — End: 2023-03-03
  Filled 2022-11-30 – 2022-12-03 (×2): qty 270, 90d supply, fill #0

## 2022-11-30 NOTE — Telephone Encounter (Signed)
Pharmacy updated.

## 2022-11-30 NOTE — Telephone Encounter (Signed)
Prescription Request  11/30/2022  LOV: 10/20/2022  What is the name of the medication or equipment? Alprazolam  Have you contacted your pharmacy to request a refill? Yes   Which pharmacy would you like this sent to?  [place] [facility] [address] [place] [address] Phone: [phone] Fax: [phone]    Patient notified that their request is being sent to the clinical staff for review and that they should receive a response within 2 business days.   Please advise at Mobile [phone] (mobile)

## 2022-11-30 NOTE — Telephone Encounter (Signed)
Patient would like to change her pharmacy to the [facility]. She said she no longer wants to use [facility] or [facility].

## 2022-12-03 ENCOUNTER — Other Ambulatory Visit (HOSPITAL_COMMUNITY): Payer: Self-pay

## 2022-12-05 ENCOUNTER — Other Ambulatory Visit (HOSPITAL_COMMUNITY): Payer: Self-pay

## 2022-12-05 ENCOUNTER — Other Ambulatory Visit: Payer: Self-pay

## 2022-12-06 ENCOUNTER — Other Ambulatory Visit (HOSPITAL_COMMUNITY): Payer: Self-pay

## 2022-12-06 MED ORDER — FUROSEMIDE 20 MG PO TABS
20.0000 mg | ORAL_TABLET | Freq: Every day | ORAL | 0 refills | Status: DC
  Filled 2022-12-06: qty 90, 90d supply, fill #0

## 2022-12-06 NOTE — Telephone Encounter (Signed)
Patient would like future refills for all medications to be sent to [facility]. She said they only received one medication.

## 2022-12-06 NOTE — Addendum Note (Signed)
Addended by: Darryll Capers on: 12/06/2022 10:51 AM   Modules accepted: Orders

## 2022-12-06 NOTE — Telephone Encounter (Signed)
Pt stated that she needed a refill of Furosemide sent to [facility].   Rx sent.

## 2022-12-09 ENCOUNTER — Ambulatory Visit: Payer: Medicare Other

## 2022-12-09 ENCOUNTER — Other Ambulatory Visit: Payer: Self-pay | Admitting: Internal Medicine

## 2022-12-09 DIAGNOSIS — Z1211 Encounter for screening for malignant neoplasm of colon: Secondary | ICD-10-CM

## 2022-12-09 DIAGNOSIS — Z1212 Encounter for screening for malignant neoplasm of rectum: Secondary | ICD-10-CM

## 2022-12-12 ENCOUNTER — Ambulatory Visit: Payer: Medicare Other | Admitting: Internal Medicine

## 2022-12-16 ENCOUNTER — Ambulatory Visit: Payer: Medicare Other

## 2022-12-16 DIAGNOSIS — Z23 Encounter for immunization: Secondary | ICD-10-CM | POA: Diagnosis not present

## 2022-12-16 DIAGNOSIS — D510 Vitamin B12 deficiency anemia due to intrinsic factor deficiency: Secondary | ICD-10-CM

## 2022-12-16 DIAGNOSIS — Z7901 Long term (current) use of anticoagulants: Secondary | ICD-10-CM | POA: Diagnosis not present

## 2022-12-16 LAB — POCT INR: INR: 2.3 (ref 2.0–3.0)

## 2022-12-16 MED ORDER — CYANOCOBALAMIN 1000 MCG/ML IJ SOLN
1000.0000 ug | Freq: Once | INTRAMUSCULAR | Status: AC
Start: 2022-12-16 — End: 2022-12-16
  Administered 2022-12-16: 1000 ug via INTRAMUSCULAR

## 2022-12-16 NOTE — Patient Instructions (Addendum)
Pre visit review using our clinic review tool, if applicable. No additional management support is needed unless otherwise documented below in the visit note.  Continue 1 tablet daily except take 1 1/2 tablets on Wednesdays. Recheck in 5 weeks.

## 2022-12-16 NOTE — Progress Notes (Signed)
Continue 1 tablet daily except take 1 1/2 tablets on Wednesdays. Recheck in 5 weeks.  Pt requested B12 injection. Last injection given on 8/15. Ok for injection per PCP. Administered B12 injection. Pt tolerated well.   Pt also requested high dose flu vaccine. Administered vaccine. Pt tolerated well.

## 2022-12-26 ENCOUNTER — Other Ambulatory Visit (HOSPITAL_COMMUNITY): Payer: Self-pay

## 2022-12-26 DIAGNOSIS — G894 Chronic pain syndrome: Secondary | ICD-10-CM | POA: Diagnosis not present

## 2022-12-26 DIAGNOSIS — M150 Primary generalized (osteo)arthritis: Secondary | ICD-10-CM | POA: Diagnosis not present

## 2022-12-26 DIAGNOSIS — M25511 Pain in right shoulder: Secondary | ICD-10-CM | POA: Diagnosis not present

## 2022-12-26 DIAGNOSIS — Z79891 Long term (current) use of opiate analgesic: Secondary | ICD-10-CM | POA: Diagnosis not present

## 2022-12-26 MED ORDER — OXYCODONE-ACETAMINOPHEN 10-325 MG PO TABS
1.0000 | ORAL_TABLET | Freq: Every day | ORAL | 0 refills | Status: DC | PRN
  Filled 2023-01-03: qty 150, 30d supply, fill #0

## 2022-12-27 ENCOUNTER — Other Ambulatory Visit (HOSPITAL_COMMUNITY): Payer: Self-pay

## 2023-01-02 ENCOUNTER — Other Ambulatory Visit (HOSPITAL_COMMUNITY): Payer: Self-pay

## 2023-01-03 ENCOUNTER — Other Ambulatory Visit: Payer: Self-pay

## 2023-01-03 ENCOUNTER — Other Ambulatory Visit (HOSPITAL_COMMUNITY): Payer: Self-pay

## 2023-01-12 ENCOUNTER — Other Ambulatory Visit: Payer: Self-pay

## 2023-01-12 ENCOUNTER — Encounter: Payer: Self-pay | Admitting: Obstetrics and Gynecology

## 2023-01-12 ENCOUNTER — Telehealth: Payer: Self-pay | Admitting: Internal Medicine

## 2023-01-12 ENCOUNTER — Ambulatory Visit: Payer: Medicare Other | Admitting: Obstetrics and Gynecology

## 2023-01-12 ENCOUNTER — Other Ambulatory Visit (HOSPITAL_COMMUNITY): Payer: Self-pay

## 2023-01-12 VITALS — BP 112/73 | HR 87 | Ht 60.98 in | Wt 171.8 lb

## 2023-01-12 DIAGNOSIS — N3281 Overactive bladder: Secondary | ICD-10-CM | POA: Diagnosis not present

## 2023-01-12 DIAGNOSIS — K5904 Chronic idiopathic constipation: Secondary | ICD-10-CM

## 2023-01-12 DIAGNOSIS — N952 Postmenopausal atrophic vaginitis: Secondary | ICD-10-CM

## 2023-01-12 DIAGNOSIS — R350 Frequency of micturition: Secondary | ICD-10-CM | POA: Diagnosis not present

## 2023-01-12 DIAGNOSIS — N816 Rectocele: Secondary | ICD-10-CM | POA: Diagnosis not present

## 2023-01-12 DIAGNOSIS — Z7901 Long term (current) use of anticoagulants: Secondary | ICD-10-CM

## 2023-01-12 LAB — POCT URINALYSIS DIPSTICK
Bilirubin, UA: NEGATIVE
Blood, UA: NEGATIVE
Glucose, UA: POSITIVE — AB
Ketones, UA: NEGATIVE
Leukocytes, UA: NEGATIVE
Nitrite, UA: NEGATIVE
Protein, UA: NEGATIVE
Spec Grav, UA: 1.015 (ref 1.010–1.025)
Urobilinogen, UA: 0.2 U/dL — NL
pH, UA: 6 (ref 5.0–8.0)

## 2023-01-12 MED ORDER — WARFARIN SODIUM 3 MG PO TABS
ORAL_TABLET | ORAL | 0 refills | Status: DC
  Filled 2023-01-12: qty 110, 90d supply, fill #0

## 2023-01-12 MED ORDER — VIBEGRON 75 MG PO TABS: 75.0000 mg | ORAL_TABLET | Freq: Every day | ORAL | Status: DC

## 2023-01-12 MED ORDER — FUROSEMIDE 20 MG PO TABS
20.0000 mg | ORAL_TABLET | Freq: Every day | ORAL | 0 refills | Status: DC
  Filled 2023-01-12: qty 90, 90d supply, fill #0

## 2023-01-12 MED ORDER — ESTRADIOL 0.1 MG/GM VA CREA
0.5000 g | TOPICAL_CREAM | VAGINAL | 11 refills | Status: DC
  Filled 2023-01-12: qty 42.5, 90d supply, fill #0
  Filled 2023-04-10: qty 42.5, 84d supply, fill #1
  Filled 2023-04-11 – 2023-07-03 (×2): qty 42.5, 90d supply, fill #1

## 2023-01-12 NOTE — Patient Instructions (Addendum)
You have posterior vaginal wall prolapse. We can treat this with a pessary. This is a device you would put into the vagina and we can take it out every 3-4 months to clean it.  I have sent in a prescription for vaginal estrogen. I want you to use this every night for the next 2 weeks.   For your bladder leakage I want you to try Gemtesa 75mg  daily. I have given you 2 weeks of samples so we can see if this is helpful for you.

## 2023-01-12 NOTE — Telephone Encounter (Signed)
Next OV is 02/20/2023.   Prescription Request  01/12/2023  LOV: 10/20/2022  What is the name of the medication or equipment?  warfarin (COUMADIN) 3 MG tablet furosemide (LASIX) 20 MG tablet  Have you contacted your pharmacy to request a refill? Yes   Which pharmacy would you like this sent to?  [place] [facility] [address] [place] [address] Phone: [phone] Fax: [phone]    Patient notified that their request is being sent to the clinical staff for review and that they should receive a response within 2 business days.   Please advise at Mobile [phone] (mobile)

## 2023-01-12 NOTE — Progress Notes (Addendum)
[facility]necology New Patient Evaluation and Consultation  Referring Provider: Etta Grandchild, MD PCP: Etta Grandchild, MD Date of Service: 01/12/2023  SUBJECTIVE Chief Complaint: New Patient (Initial Visit) Kristen [name] is a 77 y.o. female is here for incontinence.)  History of Present Illness: Kristen [name] is a 77 y.o. Black or African-American and White or Caucasian female seen in consultation for evaluation of incontinence   Review of records significant for:  Hospitalization for fecal impaction Prior stroke  Urinary Symptoms: Leaks urine with going from sitting to standing, with a full bladder, with movement to the bathroom, and with urgency Leaks 2-5 time(s) per days.  Pad use: 2 pads per day.   Patient is bothered by UI symptoms.  Day time voids 8.  Nocturia: 3 times per night to void. Voiding dysfunction:  does not empty bladder well.  Patient does not use a catheter to empty bladder.  When urinating, patient feels dribbling after finishing Drinks: 48-60oz Water, occasional coke per day  UTIs: 2 UTI's in the last year.   Reports history of blood in urine Susceptibility data from last 90 days. Collected Specimen Info Organism AMOXICILLIN/CLAVULANATE AMPICILLIN AMPICILLIN/SULBACTAM CEFAZOLIN CEFEPIME Ceftazidime CEFTRIAXONE Ciprofloxacin Gentamicin Susc lslt Imipenem LEVOFLOXACIN  10/20/22 Blood Klebsiella pneumoniae  S  R  S  NR  S  S  S  S  S  S  S   Collected Specimen Info Organism Nitrofurantoin Susc lslt Piperacillin + Tazobactam Tobramycin Susc lslt Trimethoprim/Sulfa  10/20/22 Blood Klebsiella pneumoniae  R  S  S  S    Pelvic Organ Prolapse Symptoms:                  Patient Admits to a feeling of a bulge the vaginal area. It has been present for 1 years.  Patient Admits to seeing a bulge.  This bulge is bothersome.  Bowel Symptom: Bowel movements: 2 time(s) per week Stool consistency: hard or solid Straining: yes.  Splinting: yes.   Incomplete evacuation: yes.  Patient Denies accidental bowel leakage / fecal incontinence Bowel regimen: fiber Last colonoscopy: [facility]          Discontinued - Colonoscopy  Discontinued      Frequency changed to Never automatically (Topic No Longer Applies)   03/20/2012  Done - [facility]   Only the first 1 history entries have been loaded, but more history exists.            Sexual Function Sexually active: no.  Sexual orientation: Straight Pain with sex: No  Pelvic Pain Denies pelvic pain   Past Medical History:  Past Medical History:  Diagnosis Date   Arthritis    Blood transfusion without reported diagnosis    Bursitis    CAD (coronary artery disease)    a. s/p CABG 2004.b. stable cath 2014 demonstrating stable CAD and continued patency of her LIMA graft.   Chronic anticoagulation    on coumadin   DDD (degenerative disc disease), lumbar    Depression    Diabetes mellitus    Diastolic dysfunction    per echo in October 2012 with EF 50 to 55%   Fibromyalgia    GERD (gastroesophageal reflux disease) 10/23/2003   Headache(784.0)    Hyperlipidemia    Hypertension    Hypokalemia    LBBB (left bundle branch block)    Lumbar back pain    LV dysfunction    a. EF 45% by cath 2014. b. EF 50-55% by technically difficult  echo in 08/2014.   Lymphadenitis    Morbid obesity (HCC)    a. Sleep study negative for significant OSA in 11/2014.   PAF (paroxysmal atrial fibrillation) (HCC)    Stroke [facility]) 2004   affected speech per pt     Past Surgical History:   Past Surgical History:  Procedure Laterality Date   ABDOMINAL HYSTERECTOMY     ANGIOPLASTY  laminectomy   CHOLECYSTECTOMY     CORONARY ARTERY BYPASS GRAFT     LIMA to LAD    KNEE ARTHROSCOPY     LEFT HEART CATHETERIZATION WITH CORONARY/GRAFT ANGIOGRAM  12/07/2012   Procedure: LEFT HEART CATHETERIZATION WITH Isabel Caprice;  Surgeon: Micheline Chapman, MD;  Location: [facility] CATH LAB;  Service:  Cardiovascular;;   LUMBAR LAMINECTOMY     x3   RIGHT/LEFT HEART CATH AND CORONARY/GRAFT ANGIOGRAPHY N/A 11/22/2021   Procedure: RIGHT/LEFT HEART CATH AND CORONARY/GRAFT ANGIOGRAPHY;  Surgeon: Tonny Bollman, MD;  Location: [facility] INVASIVE CV LAB;  Service: Cardiovascular;  Laterality: N/A;   SPHINCTEROTOMY     TONSILLECTOMY       Past OB/GYN History: G3 P2 G2P2002 Vaginal deliveries: 3,  Forceps/ Vacuum deliveries: Yes with her deceased child, Cesarean section: 0 Menopausal: Yes Contraception: N/a. Last pap smear was Hysterectomy   Medications: Patient has a current medication list which includes the following prescription(s): alprazolam, atorvastatin, comfort ez pen needles, dexcom g7 receiver, dexcom g7 sensor, cyanocobalamin, cyclobenzaprine, diclofenac sodium, estradiol, furosemide, gabapentin, jardiance, magnesium oxide, metformin, metoprolol succinate, nitroglycerin, oxycodone-acetaminophen, oxycodone-acetaminophen, oxycodone-acetaminophen, polyethylene glycol, potassium chloride sa, entresto, rybelsus, spironolactone, vibegron, warfarin, and toujeo max solostar.   Allergies: Patient is allergic to metformin and related, cleocin [clindamycin hcl], codeine, doxycycline hyclate, macrolides and ketolides, morphine, pentazocine lactate, vibramycin [doxycycline calcium], clindamycin, definity [perflutren lipid microsphere], doxycycline, metformin, pentazocine, sulfa antibiotics, and sulfonamide derivatives.   Social History:  Social History   Tobacco Use   Smoking status: Former    Current packs/day: 0.00    Types: Cigarettes    Quit date: 03/07/2001    Years since quitting: 21.8    Passive exposure: Current   Smokeless tobacco: Never  Vaping Use   Vaping status: Never Used  Substance Use Topics   Alcohol use: No   Drug use: Yes    Types: Oxycodone    Relationship status: widowed Patient lives with independent living.   Patient is not employed. Regular exercise: No History of  abuse: No  Family History:   Family History  Problem Relation Age of Onset   Stroke Mother    Hypertension Mother    Arthritis Mother    Heart attack Father    Heart disease Father    Depression Sister    Heart attack Sister    Kidney cancer Sister    Hypertension Brother    Diabetes Brother    Arthritis Brother    Anxiety disorder Maternal Aunt    Diabetes Maternal Aunt        x 2   Colon cancer Paternal Uncle    Hypertension Son    Obesity Son      Review of Systems: Review of Systems  Constitutional:  Positive for malaise/fatigue. Negative for fever.  Respiratory:  Negative for cough, shortness of breath and wheezing.   Cardiovascular:  Positive for chest pain and leg swelling.  Gastrointestinal:  Positive for constipation. Negative for abdominal pain, blood in stool and diarrhea.  Genitourinary:  Positive for dysuria, frequency and urgency.  Skin:  Positive for rash.  Neurological:  Positive for weakness. Negative for dizziness and headaches.  Endo/Heme/Allergies:  Bruises/bleeds easily.  Psychiatric/Behavioral:  Positive for depression. The patient is nervous/anxious.      OBJECTIVE Physical Exam: Vitals:   01/12/23 1407  BP: 112/73  Pulse: 87  Weight: 171 lb 12.8 oz (77.9 kg)  Height: 5' 0.98" (1.549 m)    Physical Exam Vitals reviewed. Exam conducted with a chaperone present.  Constitutional:      Appearance: Normal appearance.  Pulmonary:     Effort: Pulmonary effort is normal.  Abdominal:     General: Abdomen is flat.  Skin:    General: Skin is warm and dry.     Findings: Rash (From the vagina down to the rectal area.) present.  Neurological:     Mental Status: She is alert and oriented to person, place, and time.  Psychiatric:        Attention and Perception: She perceives visual hallucinations.        Mood and Affect: Mood is anxious. Affect is tearful.        Behavior: Behavior is agitated.        Thought Content: Thought content is  paranoid.      GU / Detailed Urogynecologic Evaluation:  Pelvic Exam: Normal external female genitalia; Bartholin's and Skene's glands normal in appearance; urethral meatus normal in appearance, no urethral masses or discharge.   CST: negative  s/p hysterectomy: Speculum exam reveals normal vaginal mucosa with  atrophy and normal vaginal cuff.  Adnexa normal adnexa.    With apex supported, anterior compartment defect was present  Pelvic floor strength I/V  Pelvic floor musculature: Right levator tender, Right obturator tender, Left levator tender, Left obturator tender  POP-Q:   POP-Q  -2                                            Aa   -2                                           Ba  -6.5                                              C   4                                            Gh  3.5                                            Pb  9                                            tvl   -1  Ap  -1                                            Bp                                                 D      Rectal Exam:  Normal external exam  Post-Void Residual (PVR) by Bladder Scan: In order to evaluate bladder emptying, we discussed obtaining a postvoid residual and patient agreed to this procedure.  Procedure: The ultrasound unit was placed on the patient's abdomen in the suprapubic region after the patient had voided.    Post Void Residual - 01/12/23 1426       Post Void Residual   Post Void Residual 11 mL              Laboratory Results: Lab Results  Component Value Date   COLORU yellow 01/12/2023   CLARITYU slightly cloudy 01/12/2023   GLUCOSEUR Positive (A) 01/12/2023   BILIRUBINUR negative 01/12/2023   KETONESU negative 01/12/2023   SPECGRAV 1.015 01/12/2023   RBCUR negative 01/12/2023   PHUR 6.0 01/12/2023   PROTEINUR Negative 01/12/2023   UROBILINOGEN 0.2 01/12/2023   LEUKOCYTESUR Negative  01/12/2023    Lab Results  Component Value Date   CREATININE 0.76 10/20/2022   CREATININE 0.72 07/19/2022   CREATININE 0.83 06/09/2022    Lab Results  Component Value Date   HGBA1C 9.8 (H) 10/20/2022    Lab Results  Component Value Date   HGB 12.6 10/20/2022     ASSESSMENT AND PLAN [name] is a 77 y.o. with:  1. Urinary frequency   2. OAB (overactive bladder)   3. Posterior vaginal wall prolapse   4. Chronic idiopathic constipation   5. Vaginal atrophy    We discussed the symptoms of overactive bladder (OAB), which include urinary urgency, urinary frequency, nocturia, with or without urge incontinence.  While we do not know the exact etiology of OAB, several treatment options exist. We discussed management including behavioral therapy (decreasing bladder irritants, urge suppression strategies, timed voids, bladder retraining), physical therapy, medication; for refractory cases posterior tibial nerve stimulation, sacral neuromodulation, and intravesical botulinum toxin injection. For anticholinergic medications, we discussed the potential side effects of anticholinergics including dry eyes, dry mouth, constipation, cognitive impairment and urinary retention. For Beta-3 agonist medication, we discussed the potential side effect of elevated blood pressure which is more likely to occur in individuals with uncontrolled hypertension. Samples of Gemtesa given for x2 weeks. Patient to try this to see if it helps her OAB and urinary frequency.  Patient has stage 1 posterior vaginal wall prolapse. She reports she sometimes has to splint for bowel movements and has been hospitalized for being impacted. We discussed doing a vaginal pessary to which she is open to this. She is not a good surgical candidate with her prior stroke and uncontrolled diabetes.  Patient has vaginal atrophy on exam. She would benefit from estrogen cream. Patient to use a blueberry sized amount into the vagina. She  may use this nightly for 2 weeks and then twice weekly after. We discussed using her finger instead of using the applicator.   Of note, patient encouraged me to call  her son and let him know what we had discussed today.  She reported she would like me to tell her son Ethelene Browns details of what occurred.  I was on the phone with Ethelene Browns for approximately 25 minutes.  He reports she has had significant stressors related to seeing "bugs" around her residence, even when they are not bugs present.  She reports she would believe there were bugs in her bed, around her furniture, and in her clothing.  He states that she got rid of all of her clothing, bedding, and her bed and purchase new ones which she could not at the time afford.  He also reports that she frequently orders out on [employer] and does not make sugar conscious choices which is one of the reasons for her elevated A1c recently.  He states that there are some depression/anxiety issues that have also not been addressed and cause her to feel overwhelmed and frustrated easily.  I did discuss with Ethelene Browns that patient's issues that were addressed today are all quality-of-life issues and that none of them are life-threatening.  He reported understanding.  Patient to follow up in 2-3 weeks for pessary fitting after she has used the cream nightly for two weeks.     Selmer Dominion, NP

## 2023-01-12 NOTE — Telephone Encounter (Signed)
 Medication refill sent to Dr. Yetta Barre

## 2023-01-13 ENCOUNTER — Other Ambulatory Visit (HOSPITAL_COMMUNITY): Payer: Self-pay

## 2023-01-17 ENCOUNTER — Ambulatory Visit: Payer: Medicare Other

## 2023-01-19 NOTE — Telephone Encounter (Signed)
Pt LVM she needs refill of warfarin. Refill was sent by PCP on 11/7 to pt's pharmacy of choice.

## 2023-01-20 ENCOUNTER — Ambulatory Visit (INDEPENDENT_AMBULATORY_CARE_PROVIDER_SITE_OTHER): Payer: Medicare Other

## 2023-01-20 ENCOUNTER — Other Ambulatory Visit (HOSPITAL_COMMUNITY): Payer: Self-pay

## 2023-01-20 DIAGNOSIS — D510 Vitamin B12 deficiency anemia due to intrinsic factor deficiency: Secondary | ICD-10-CM | POA: Diagnosis not present

## 2023-01-20 DIAGNOSIS — Z7901 Long term (current) use of anticoagulants: Secondary | ICD-10-CM | POA: Diagnosis not present

## 2023-01-20 LAB — POCT INR: INR: 2.5 (ref 2.0–3.0)

## 2023-01-20 MED ORDER — CYANOCOBALAMIN 1000 MCG/ML IJ SOLN
1000.0000 ug | Freq: Once | INTRAMUSCULAR | Status: AC
  Administered 2023-01-20: 1000 ug via INTRAMUSCULAR

## 2023-01-20 NOTE — Progress Notes (Signed)
Pt recently prescribed vibegron and estradiol cream. Continue 1 tablet daily except take 1 1/2 tablets on Wednesdays. Recheck in 5 weeks. Pt requested B12 injection. Last injection given on 12/16/22. Ok for injection  per PCP. Administered B12 injection. Pt tolerated well.

## 2023-01-20 NOTE — Patient Instructions (Addendum)
Pre visit review using our clinic review tool, if applicable. No additional management support is needed unless otherwise documented below in the visit note.  Continue 1 tablet daily except take 1 1/2 tablets on Wednesdays. Recheck in 5 weeks.

## 2023-01-23 ENCOUNTER — Other Ambulatory Visit (HOSPITAL_COMMUNITY): Payer: Self-pay

## 2023-01-23 DIAGNOSIS — Z79891 Long term (current) use of opiate analgesic: Secondary | ICD-10-CM | POA: Diagnosis not present

## 2023-01-23 DIAGNOSIS — G894 Chronic pain syndrome: Secondary | ICD-10-CM | POA: Diagnosis not present

## 2023-01-23 DIAGNOSIS — M150 Primary generalized (osteo)arthritis: Secondary | ICD-10-CM | POA: Diagnosis not present

## 2023-01-23 DIAGNOSIS — M25511 Pain in right shoulder: Secondary | ICD-10-CM | POA: Diagnosis not present

## 2023-01-23 MED ORDER — OXYCODONE-ACETAMINOPHEN 10-325 MG PO TABS
1.0000 | ORAL_TABLET | Freq: Every day | ORAL | 0 refills | Status: DC
  Filled 2023-02-01: qty 150, 30d supply, fill #0

## 2023-01-25 ENCOUNTER — Other Ambulatory Visit (HOSPITAL_COMMUNITY): Payer: Self-pay

## 2023-01-25 ENCOUNTER — Other Ambulatory Visit: Payer: Self-pay | Admitting: Internal Medicine

## 2023-01-25 MED ORDER — POTASSIUM CHLORIDE CRYS ER 20 MEQ PO TBCR
20.0000 meq | EXTENDED_RELEASE_TABLET | Freq: Two times a day (BID) | ORAL | 0 refills | Status: DC
  Filled 2023-01-25 – 2023-01-27 (×2): qty 180, 90d supply, fill #0

## 2023-01-27 ENCOUNTER — Other Ambulatory Visit: Payer: Self-pay

## 2023-01-27 ENCOUNTER — Other Ambulatory Visit (HOSPITAL_COMMUNITY): Payer: Self-pay

## 2023-01-27 ENCOUNTER — Other Ambulatory Visit: Payer: Self-pay | Admitting: Internal Medicine

## 2023-01-27 DIAGNOSIS — E1169 Type 2 diabetes mellitus with other specified complication: Secondary | ICD-10-CM

## 2023-01-27 MED ORDER — ATORVASTATIN CALCIUM 40 MG PO TABS
40.0000 mg | ORAL_TABLET | Freq: Every day | ORAL | 0 refills | Status: DC
Start: 2023-01-27 — End: 2023-04-20
  Filled 2023-01-27 (×2): qty 90, 90d supply, fill #0

## 2023-01-30 ENCOUNTER — Ambulatory Visit: Payer: Medicare Other | Admitting: Obstetrics and Gynecology

## 2023-01-30 ENCOUNTER — Encounter: Payer: Self-pay | Admitting: Obstetrics and Gynecology

## 2023-01-30 ENCOUNTER — Other Ambulatory Visit (HOSPITAL_COMMUNITY): Payer: Self-pay

## 2023-01-30 VITALS — BP 101/55 | HR 68

## 2023-01-30 DIAGNOSIS — N812 Incomplete uterovaginal prolapse: Secondary | ICD-10-CM

## 2023-01-30 DIAGNOSIS — R350 Frequency of micturition: Secondary | ICD-10-CM

## 2023-01-30 DIAGNOSIS — N816 Rectocele: Secondary | ICD-10-CM

## 2023-01-30 DIAGNOSIS — N3281 Overactive bladder: Secondary | ICD-10-CM

## 2023-01-30 MED ORDER — VIBEGRON 75 MG PO TABS
75.0000 mg | ORAL_TABLET | Freq: Every day | ORAL | 5 refills | Status: DC
  Filled 2023-01-30 – 2023-01-31 (×2): qty 30, 30d supply, fill #0
  Filled 2023-03-07 – 2023-03-14 (×3): qty 30, 30d supply, fill #1

## 2023-01-30 NOTE — Progress Notes (Signed)
[facility]   Subjective:     Chief Complaint: Follow-up Kristen [name] is a 77 y.o. female here to discuss pessary options. )  History of Present Illness: Kristen [name] is a 77 y.o. female with stage II pelvic organ prolapse who presents today for a pessary fitting.    Past Medical History: Patient  has a past medical history of Arthritis, Blood transfusion without reported diagnosis, Bursitis, CAD (coronary artery disease), Chronic anticoagulation, DDD (degenerative disc disease), lumbar, Depression, Diabetes mellitus, Diastolic dysfunction, Fibromyalgia, GERD (gastroesophageal reflux disease) (10/23/2003), Headache(784.0), Hyperlipidemia, Hypertension, Hypokalemia, LBBB (left bundle branch block), Lumbar back pain, LV dysfunction, Lymphadenitis, Morbid obesity (HCC), PAF (paroxysmal atrial fibrillation) (HCC), and Stroke (HCC) (2004).   Past Surgical History: She  has a past surgical history that includes Angioplasty (laminectomy); Lumbar laminectomy; Coronary artery bypass graft; Abdominal hysterectomy; Cholecystectomy; Tonsillectomy; Knee arthroscopy; Sphincterotomy; left heart catheterization with coronary/graft angiogram (12/07/2012); and RIGHT/LEFT HEART CATH AND CORONARY/GRAFT ANGIOGRAPHY (N/A, 11/22/2021).   Medications: She has a current medication list which includes the following prescription(s): alprazolam, atorvastatin, comfort ez pen needles, dexcom g7 receiver, dexcom g7 sensor, cyanocobalamin, cyclobenzaprine, diclofenac sodium, estradiol, furosemide, gabapentin, toujeo max solostar, jardiance, magnesium oxide, metformin, metoprolol succinate, nitroglycerin, oxycodone-acetaminophen, oxycodone-acetaminophen, oxycodone-acetaminophen, [START ON 02/01/2023] oxycodone-acetaminophen, polyethylene glycol, potassium chloride sa, entresto, rybelsus, spironolactone, vibegron, and warfarin.   Allergies: Patient is allergic to metformin and related, cleocin  [clindamycin hcl], codeine, doxycycline hyclate, macrolides and ketolides, morphine, pentazocine lactate, vibramycin [doxycycline calcium], clindamycin, definity [perflutren lipid microsphere], doxycycline, metformin, pentazocine, sulfa antibiotics, and sulfonamide derivatives.   Social History: Patient  reports that she quit smoking about 21 years ago. Her smoking use included cigarettes. She has been exposed to tobacco smoke. She has never used smokeless tobacco. She reports current drug use. Drug: Oxycodone. She reports that she does not drink alcohol.      Objective:    BP (!) 101/55   Pulse 68  Gen: No apparent distress, A&O x 3. Pelvic Exam: Normal external female genitalia; Bartholin's and Skene's glands normal in appearance; urethral meatus normal in appearance, no urethral masses or discharge.   Attempted a #2 Donut which was too girthy for patient.  Attempted a #3 Incontinence dish which she found the knob too uncomfortable at the opening.   A size #3 cup pessary (Lot [id number]) was fitted. It was comfortable, stayed in place with valsalva and was an appropriate size on examination, with one finger fitting between the pessary and the vaginal walls. She was able to sit as if she were going to urinate without pessary expulsion.   Assessment/Plan:    Assessment: [name] is a 77 y.o. with stage II pelvic organ prolapse who presents for a pessary fitting. Plan: She was fitted with a #3 cup pessary. She will keep the pessary in place until next visit. She will use estrogen.   Follow-up in 3-4 weeks for a pessary check or sooner as needed.  All questions were answered.    Selmer Dominion, NP

## 2023-01-30 NOTE — Patient Instructions (Addendum)
R your bowel movements I suggest getting a stool of some kind for the bathroom so you can have your knees higher than your belly button. You can use this nightly.   Many people lose pubic hair as you age. I did not see any sign of bugs or bugs that look like hair.   Continue to use estrogen twice a week  Use the desitin around the rectal area.

## 2023-01-31 ENCOUNTER — Other Ambulatory Visit (HOSPITAL_BASED_OUTPATIENT_CLINIC_OR_DEPARTMENT_OTHER): Payer: Self-pay

## 2023-01-31 ENCOUNTER — Other Ambulatory Visit: Payer: Self-pay | Admitting: Internal Medicine

## 2023-01-31 ENCOUNTER — Other Ambulatory Visit (HOSPITAL_COMMUNITY): Payer: Self-pay

## 2023-01-31 DIAGNOSIS — I480 Paroxysmal atrial fibrillation: Secondary | ICD-10-CM

## 2023-01-31 MED ORDER — METOPROLOL SUCCINATE ER 100 MG PO TB24
100.0000 mg | ORAL_TABLET | Freq: Two times a day (BID) | ORAL | 0 refills | Status: DC
  Filled 2023-01-31: qty 180, 90d supply, fill #0

## 2023-02-01 ENCOUNTER — Other Ambulatory Visit (HOSPITAL_COMMUNITY): Payer: Self-pay

## 2023-02-08 ENCOUNTER — Telehealth: Payer: Self-pay

## 2023-02-08 ENCOUNTER — Ambulatory Visit: Payer: Medicare Other

## 2023-02-08 VITALS — Ht 60.98 in | Wt 172.0 lb

## 2023-02-08 DIAGNOSIS — Z0000 Encounter for general adult medical examination without abnormal findings: Secondary | ICD-10-CM

## 2023-02-08 NOTE — Patient Instructions (Signed)
Ms. Rosamond Deveaux , Thank you for taking time to come for your Medicare Wellness Visit. I appreciate your ongoing commitment to your health goals. Please review the following plan we discussed and let me know if I can assist you in the future.   Referrals/Orders/Follow-Ups/Clinician Recommendations: This nurse will contact Dr. Huel Coventry office to get updated eye exam report.  This is a list of the screening recommended for you and due dates:  Health Maintenance  Topic Date Due   Cologuard (Stool DNA test)  Never done   Mammogram  12/26/2020   Eye exam for diabetics  11/19/2021   Complete foot exam   11/22/2022   Hemoglobin A1C  04/22/2023   Yearly kidney function blood test for diabetes  10/20/2023   Yearly kidney health urinalysis for diabetes  10/20/2023   Medicare Annual Wellness Visit  02/08/2024   DTaP/Tdap/Td vaccine (3 - Td or Tdap) 04/14/2026   Pneumonia Vaccine  Completed   Flu Shot  Completed   DEXA scan (bone density measurement)  Completed   Hepatitis C Screening  Completed   HPV Vaccine  Aged Out   Colon Cancer Screening  Discontinued   COVID-19 Vaccine  Discontinued   Zoster (Shingles) Vaccine  Discontinued    Advanced directives: (Declined) Advance directive discussed with you today. Even though you declined this today, please call our office should you change your mind, and we can give you the proper paperwork for you to fill out.  Next Medicare Annual Wellness Visit scheduled for next year: No

## 2023-02-08 NOTE — Progress Notes (Signed)
Subjective:   Kristen [name] is a 77 y.o. female who presents for Medicare Annual (Subsequent) preventive examination.  Visit Complete: Virtual I connected with  [name] on 02/08/23 by a audio enabled telemedicine application and verified that I am speaking with the correct person using two identifiers.  Patient Location: Home  Provider Location: Office/Clinic  I discussed the limitations of evaluation and management by telemedicine. The patient expressed understanding and agreed to proceed.  Vital Signs: Because this visit was a virtual/telehealth visit, some criteria may be missing or patient reported. Any vitals not documented were not able to be obtained and vitals that have been documented are patient reported.  Cardiac Risk Factors include: advanced age (>91men, >42 women);dyslipidemia;hypertension;obesity (BMI >30kg/m2);sedentary lifestyle;family history of premature cardiovascular disease     Objective:    Today's Vitals   02/08/23 1433 02/08/23 1437  Weight: 172 lb (78 kg)   Height: 5' 0.98" (1.549 m)   PainSc: 7  7    Body mass index is 32.52 kg/m.     02/08/2023    2:41 PM 06/09/2022    1:39 PM 01/26/2022    2:37 PM 11/22/2021    9:34 AM 07/22/2021    1:40 PM 07/21/2021   10:51 PM 07/05/2021    1:13 PM  Advanced Directives  Does Patient Have a Medical Advance Directive? No No No Yes No No No  Type of Theme park manager;Living will     Does patient want to make changes to medical advance directive?    No - Patient declined     Copy of Healthcare Power of Attorney in Chart?    No - copy requested     Would patient like information on creating a medical advance directive? No - Patient declined No - Patient declined No - Patient declined   No - Patient declined     Current Medications (verified) Outpatient Encounter Medications as of 02/08/2023  Medication Sig   ALPRAZolam (XANAX) 1 MG tablet Take 1 tablet (1 mg total)  by mouth 3 (three) times daily as needed.   atorvastatin (LIPITOR) 40 MG tablet Take 1 tablet (40 mg total) by mouth daily.   COMFORT EZ PEN NEEDLES 31G X 8 MM MISC USE WITH insulin AS DIRECTED   Continuous Glucose Receiver (DEXCOM G7 RECEIVER) DEVI 1 Act by Does not apply route daily.   Continuous Glucose Sensor (DEXCOM G7 SENSOR) MISC 1 Act by Does not apply route daily.   Cyanocobalamin (B-12 COMPLIANCE INJECTION IJ) Inject as directed.   cyclobenzaprine (FLEXERIL) 10 MG tablet Take 10 mg by mouth 3 (three) times daily as needed.   diclofenac Sodium (VOLTAREN) 1 % GEL Apply 2 g topically 4 (four) times daily. (Patient taking differently: Apply 2 g topically 4 (four) times daily. As needed)   estradiol (ESTRACE) 0.1 MG/GM vaginal cream Place 0.5 g vaginally 2 (two) times a week. Place 0.5g nightly for two weeks then twice a week after   furosemide (LASIX) 20 MG tablet Take 1 tablet (20 mg total) by mouth daily.   gabapentin (NEURONTIN) 300 MG capsule TAKE ONE CAPSULE BY MOUTH THREE TIMES DAILY   insulin glargine, 2 Unit Dial, (TOUJEO MAX SOLOSTAR) 300 UNIT/ML Solostar Pen Inject 50 Units into the skin daily. (Patient not taking: Reported on 01/12/2023)   JARDIANCE 25 MG TABS tablet Take 1 tablet (25 mg total) by mouth daily.   magnesium oxide (MAG-OX) 400 MG tablet TAKE ONE TABLET  BY MOUTH TWICE DAILY   metFORMIN (GLUCOPHAGE-XR) 500 MG 24 hr tablet TAKE (3) TABLETS BY MOUTH EVERY MORNING WITH BREAKFAST   metoprolol succinate (TOPROL-XL) 100 MG 24 hr tablet Take 1 tablet (100 mg total) by mouth in the morning and at bedtime.   nitroGLYCERIN (NITROSTAT) 0.4 MG SL tablet Place 1 tablet (0.4 mg total) under the tongue every 5 (five) minutes as needed for chest pain (x 3 doses). Reported on 05/08/2015   oxyCODONE-acetaminophen (PERCOCET) 10-325 MG tablet Take 1 tablet by mouth every 4 (four) hours as needed for pain.   oxyCODONE-acetaminophen (PERCOCET) 10-325 MG tablet Take 1 tablet by mouth 5 (five)  times daily as needed for pain   oxyCODONE-acetaminophen (PERCOCET) 10-325 MG tablet Take 1 tablet by mouth 5 (five) times daily as needed for pain   oxyCODONE-acetaminophen (PERCOCET) 10-325 MG tablet Take 1 tablet by mouth 5 (five) times daily as needed for pain   polyethylene glycol (MIRALAX / GLYCOLAX) 17 g packet Take 17 g by mouth daily. (Patient taking differently: Take 17 g by mouth as needed.)   potassium chloride SA (KLOR-CON M) 20 MEQ tablet Take 1 tablet (20 mEq total) by mouth in the morning and at bedtime.   sacubitril-valsartan (ENTRESTO) 97-103 MG Take 1 tablet by mouth 2 (two) times daily.   Semaglutide (RYBELSUS) 7 MG TABS Take 1 tablet (7 mg total) by mouth daily.   spironolactone (ALDACTONE) 25 MG tablet TAKE 1 TABLET BY MOUTH ONCE DAILY *REFILL REQUEST*   Vibegron 75 MG TABS Take 1 tablet (75 mg total) by mouth daily.   warfarin (COUMADIN) 3 MG tablet TAKE ONE TABLET BY MOUTH DAILY EXCEPT TAKE 1 1/2 TABLETS ON WEDNESDAYS OR AS DIRECTED BY ANTICOAGULATION CLINIC.   No facility-administered encounter medications on file as of 02/08/2023.    Allergies (verified) Metformin and related, Cleocin [clindamycin hcl], Codeine, Doxycycline hyclate, Macrolides and ketolides, Morphine, Pentazocine lactate, Vibramycin [doxycycline calcium], Clindamycin, Definity [perflutren lipid microsphere], Doxycycline, Metformin, Pentazocine, Sulfa antibiotics, and Sulfonamide derivatives   History: Past Medical History:  Diagnosis Date   Arthritis    Blood transfusion without reported diagnosis    Bursitis    CAD (coronary artery disease)    a. s/p CABG 2004.b. stable cath 2014 demonstrating stable CAD and continued patency of her LIMA graft.   Chronic anticoagulation    on coumadin   DDD (degenerative disc disease), lumbar    Depression    Diabetes mellitus    Diastolic dysfunction    per echo in October 2012 with EF 50 to 55%   Fibromyalgia    GERD (gastroesophageal reflux disease)  10/23/2003   Headache(784.0)    Hyperlipidemia    Hypertension    Hypokalemia    LBBB (left bundle branch block)    Lumbar back pain    LV dysfunction    a. EF 45% by cath 2014. b. EF 50-55% by technically difficult echo in 08/2014.   Lymphadenitis    Morbid obesity (HCC)    a. Sleep study negative for significant OSA in 11/2014.   PAF (paroxysmal atrial fibrillation) (HCC)    Stroke (HCC) 2004   affected speech per pt   Past Surgical History:  Procedure Laterality Date   ABDOMINAL HYSTERECTOMY     ANGIOPLASTY  laminectomy   CHOLECYSTECTOMY     CORONARY ARTERY BYPASS GRAFT     LIMA to LAD    KNEE ARTHROSCOPY     LEFT HEART CATHETERIZATION WITH CORONARY/GRAFT ANGIOGRAM  12/07/2012   Procedure:  LEFT HEART CATHETERIZATION WITH Isabel Caprice;  Surgeon: Micheline Chapman, MD;  Location: [facility] CATH LAB;  Service: Cardiovascular;;   LUMBAR LAMINECTOMY     x3   RIGHT/LEFT HEART CATH AND CORONARY/GRAFT ANGIOGRAPHY N/A 11/22/2021   Procedure: RIGHT/LEFT HEART CATH AND CORONARY/GRAFT ANGIOGRAPHY;  Surgeon: Tonny Bollman, MD;  Location: [facility] INVASIVE CV LAB;  Service: Cardiovascular;  Laterality: N/A;   SPHINCTEROTOMY     TONSILLECTOMY     Family History  Problem Relation Age of Onset   Stroke Mother    Hypertension Mother    Arthritis Mother    Heart attack Father    Heart disease Father    Depression Sister    Heart attack Sister    Kidney cancer Sister    Hypertension Brother    Diabetes Brother    Arthritis Brother    Anxiety disorder Maternal Aunt    Diabetes Maternal Aunt        x 2   Colon cancer Paternal Uncle    Hypertension Son    Obesity Son    Social History   Socioeconomic History   Marital status: Widowed    Spouse name: Not on file   Number of children: 2   Years of education: Not on file   Highest education level: Not on file  Occupational History   Occupation: retired  Tobacco Use   Smoking status: Former    Current packs/day: 0.00    Types:  Cigarettes    Quit date: 03/07/2001    Years since quitting: 21.9    Passive exposure: Current   Smokeless tobacco: Never  Vaping Use   Vaping status: Never Used  Substance and Sexual Activity   Alcohol use: No   Drug use: Yes    Types: Oxycodone   Sexual activity: Not Currently    Partners: Male  Other Topics Concern   Not on file  Social History Narrative   Lives locally, has help available if needed.   Social Determinants of Health   Financial Resource Strain: Low Risk  (02/08/2023)   Overall Financial Resource Strain (CARDIA)    Difficulty of Paying Living Expenses: Not very hard  Food Insecurity: No Food Insecurity (02/08/2023)   Hunger Vital Sign    Worried About Running Out of Food in the Last Year: Never true    Ran Out of Food in the Last Year: Never true  Transportation Needs: No Transportation Needs (02/08/2023)   PRAPARE - Administrator, Civil Service (Medical): No    Lack of Transportation (Non-Medical): No  Physical Activity: Inactive (02/08/2023)   Exercise Vital Sign    Days of Exercise per Week: 0 days    Minutes of Exercise per Session: 0 min  Stress: No Stress Concern Present (02/08/2023)   [employer] of Occupational Health - Occupational Stress Questionnaire    Feeling of Stress : Only a little  Social Connections: Socially Isolated (02/08/2023)   Social Connection and Isolation Panel [NHANES]    Frequency of Communication with Friends and Family: Three times a week    Frequency of Social Gatherings with Friends and Family: Twice a week    Attends Religious Services: Never    Database administrator or Organizations: No    Attends Banker Meetings: Never    Marital Status: Widowed    Tobacco Counseling Counseling given: Not Answered   Clinical Intake:  Pre-visit preparation completed: Yes  Pain : 0-10 Pain Score: 7  Pain Type: Chronic  pain Pain Location: Generalized Pain Relieving Factors: pain medication  Pain  Relieving Factors: pain medication  BMI - recorded: 32.52 Nutritional Status: BMI > 30  Obese Nutritional Risks: None Diabetes: Yes CBG done?: No Did pt. bring in CBG monitor from home?: No  How often do you need to have someone help you when you read instructions, pamphlets, or other written materials from your doctor or pharmacy?: 1 - Never What is the last grade level you completed in school?: HSG  Interpreter Needed?: No  Information entered by :: Susie Cassette, LPN.   Activities of Daily Living    02/08/2023    2:47 PM  In your present state of health, do you have any difficulty performing the following activities:  Hearing? 0  Vision? 0  Difficulty concentrating or making decisions? 0  Walking or climbing stairs? 1  Dressing or bathing? 0  Doing errands, shopping? 0  Preparing Food and eating ? N  Using the Toilet? N  In the past six months, have you accidently leaked urine? Y  Do you have problems with loss of bowel control? N  Managing your Medications? N  Managing your Finances? N  Housekeeping or managing your Housekeeping? N    Patient Care Team: Etta Grandchild, MD as PCP - General (Internal Medicine) Tonny Bollman, MD as PCP - Cardiology (Cardiology) Duke Salvia, MD as Consulting Physician (Cardiology) Mckinley Jewel, MD as Consulting Physician (Ophthalmology) Manning Charity, OD as Referring Physician (Optometry) Szabat, Vinnie Level, [facility] (Inactive) as Pharmacist (Pharmacist)  Indicate any recent Medical Services you may have received from other than [facility] providers in the past year (date may be approximate).     Assessment:   This is a routine wellness examination for Kristen [name].  Hearing/Vision screen Hearing Screening - Comments:: Denies hearing difficulties. No hearing aids.  Vision Screening - Comments:: Wears rx glasses - not up to date with routine eye exams with Janet Berlin, MD.    Goals Addressed   None   Depression Screen     02/08/2023    2:47 PM 10/20/2022    2:35 PM 02/22/2022    1:55 PM 01/26/2022    2:38 PM 11/16/2021    1:59 PM 09/01/2021    1:24 PM 01/25/2021    3:30 PM  PHQ 2/9 Scores  PHQ - 2 Score 1  0 1 0 4 0  PHQ- 9 Score 2  11   15    Exception Documentation  Patient refusal         Fall Risk    02/08/2023    3:04 PM 02/22/2022    1:54 PM 01/26/2022    2:38 PM 11/16/2021    1:59 PM 05/06/2021    3:00 PM  Fall Risk   Falls in the past year? 0 1 1 1 1   Comment     reports one fall without injury "a couple of months ago;" continues to use cane as needed  Number falls in past yr: 0 0 1 1 0  Injury with Fall? 0 0 0 0 0  Risk for fall due to : No Fall Risks No Fall Risks History of fall(s);Impaired balance/gait;Impaired mobility;Medication side effect Impaired balance/gait Impaired balance/gait;History of fall(s);Medication side effect;Other (Comment)  Risk for fall due to: Comment     chronic pain  Follow up Falls prevention discussed Falls evaluation completed Falls evaluation completed Falls evaluation completed Falls prevention discussed    MEDICARE RISK AT HOME: Medicare Risk at Home Any stairs in or  around the home?: Yes If so, are there any without handrails?: Yes Home free of loose throw rugs in walkways, pet beds, electrical cords, etc?: Yes Adequate lighting in your home to reduce risk of falls?: Yes Life alert?: Yes Use of a cane, walker or w/c?: Yes Grab bars in the bathroom?: No Shower chair or bench in shower?: Yes Elevated toilet seat or a handicapped toilet?: Yes  TIMED UP AND GO:  Was the test performed?  No    Cognitive Function:    02/08/2023    2:46 PM  MMSE - Mini Mental State Exam  Not completed: Unable to complete        02/08/2023    2:46 PM 01/26/2022    2:41 PM  6CIT Screen  What Year? 0 points 0 points  What month? 0 points 0 points  What time? 0 points 0 points  Count back from 20 0 points 0 points  Months in reverse 0 points 0 points  Repeat phrase  0 points 0 points  Total Score 0 points 0 points    Immunizations Immunization History  Administered Date(s) Administered   Fluad Quad(high Dose 65+) 11/16/2018, 12/10/2019, 12/10/2020, 11/16/2021   Fluad Trivalent(High Dose 65+) 12/16/2022   Influenza Split 12/14/2010, 12/12/2011   Influenza Whole 01/05/2006, 01/29/2007, 12/26/2007, 11/28/2008, 12/04/2009   Influenza, High Dose Seasonal PF 11/12/2014, 01/26/2016, 11/25/2016, 12/27/2017   Influenza,inj,Quad PF,6+ Mos 11/12/2012, 04/08/2014   Moderna Covid-19 Vaccine Bivalent Booster 11yrs & up 01/13/2022   Moderna Sars-Covid-2 Vaccination 04/20/2019, 06/03/2019, 01/07/2020, 09/25/2020, 12/18/2020   Pneumococcal Conjugate-13 08/09/2013   Pneumococcal Polysaccharide-23 12/04/2009, 12/14/2010, 10/12/2016, 11/16/2021   Respiratory Syncytial Virus Vaccine,Recomb Aduvanted(Arexvy) 02/24/2022   Td 03/07/2005   Tdap 04/14/2016    TDAP status: Up to date  Flu Vaccine status: Up to date  Pneumococcal vaccine status: Up to date  Covid-19 vaccine status: Completed vaccines  Qualifies for Shingles Vaccine? Yes   Zostavax completed No   Shingrix Completed?: No.    Education has been provided regarding the importance of this vaccine. Patient has been advised to call insurance company to determine out of pocket expense if they have not yet received this vaccine. Advised may also receive vaccine at local pharmacy or Health Dept. Verbalized acceptance and understanding.  Screening Tests Health Maintenance  Topic Date Due   Fecal DNA (Cologuard)  Never done   MAMMOGRAM  12/26/2020   OPHTHALMOLOGY EXAM  11/19/2021   FOOT EXAM  11/22/2022   HEMOGLOBIN A1C  04/22/2023   Diabetic kidney evaluation - eGFR measurement  10/20/2023   Diabetic kidney evaluation - Urine ACR  10/20/2023   Medicare Annual Wellness (AWV)  02/08/2024   DTaP/Tdap/Td (3 - Td or Tdap) 04/14/2026   Pneumonia Vaccine 66+ Years old  Completed   INFLUENZA VACCINE  Completed    DEXA SCAN  Completed   Hepatitis C Screening  Completed   HPV VACCINES  Aged Out   Colonoscopy  Discontinued   COVID-19 Vaccine  Discontinued   Zoster Vaccines- Shingrix  Discontinued    Health Maintenance  Health Maintenance Due  Topic Date Due   Fecal DNA (Cologuard)  Never done   MAMMOGRAM  12/26/2020   OPHTHALMOLOGY EXAM  11/19/2021   FOOT EXAM  11/22/2022    Colorectal cancer screening: No longer required.   Mammogram status: No longer required due to age.  Bone Density status: Completed 11/24/2016. Results reflect: Bone density results: NORMAL. Repeat every 5-10 years.  Lung Cancer Screening: (Low Dose CT Chest recommended  if Age 75-80 years, 20 pack-year currently smoking OR have quit w/in 15years.) does not qualify.   Lung Cancer Screening Referral: no  Additional Screening:  Hepatitis C Screening: does qualify; Completed 09/01/2015  Vision Screening: Recommended annual ophthalmology exams for early detection of glaucoma and other disorders of the eye. Is the patient up to date with their annual eye exam?  Yes  Who is the provider or what is the name of the office in which the patient attends annual eye exams? Janet Berlin, MD. If pt is not established with a provider, would they like to be referred to a provider to establish care? No .   Dental Screening: Recommended annual dental exams for proper oral hygiene  Diabetic Foot Exam: Diabetic Foot Exam: Completed 11/21/2021-overdue  Community Resource Referral / Chronic Care Management: CRR required this visit?  No   CCM required this visit?  No     Plan:     I have personally reviewed and noted the following in the patient's chart:   Medical and social history Use of alcohol, tobacco or illicit drugs  Current medications and supplements including opioid prescriptions. Patient is currently taking opioid prescriptions. Information provided to patient regarding non-opioid alternatives. Patient advised to  discuss non-opioid treatment plan with their provider. Functional ability and status Nutritional status Physical activity Advanced directives List of other physicians Hospitalizations, surgeries, and ER visits in previous 12 months Vitals Screenings to include cognitive, depression, and falls Referrals and appointments  In addition, I have reviewed and discussed with patient certain preventive protocols, quality metrics, and best practice recommendations. A written personalized care plan for preventive services as well as general preventive health recommendations were provided to patient.     Mickeal Needy, LPN   16/03/958   After Visit Summary: (Mail) Due to this being a telephonic visit, the after visit summary with patients personalized plan was offered to patient via mail   Nurse Notes: Will get eye report from Dr. Burgess Estelle.

## 2023-02-08 NOTE — Telephone Encounter (Signed)
[facility] to request copy of recent eye exam.  Laquasha Groome N. Viviann Broyles, LPN. [facility] AWV Team Direct Dial: [phone]

## 2023-02-15 ENCOUNTER — Ambulatory Visit: Payer: Medicare Other | Attending: Cardiovascular Disease | Admitting: Emergency Medicine

## 2023-02-15 ENCOUNTER — Encounter: Payer: Self-pay | Admitting: Emergency Medicine

## 2023-02-15 VITALS — BP 110/76 | HR 86 | Resp 16 | Ht 60.0 in | Wt 171.2 lb

## 2023-02-15 DIAGNOSIS — E785 Hyperlipidemia, unspecified: Secondary | ICD-10-CM | POA: Diagnosis not present

## 2023-02-15 DIAGNOSIS — I480 Paroxysmal atrial fibrillation: Secondary | ICD-10-CM

## 2023-02-15 DIAGNOSIS — I10 Essential (primary) hypertension: Secondary | ICD-10-CM | POA: Diagnosis not present

## 2023-02-15 DIAGNOSIS — I5032 Chronic diastolic (congestive) heart failure: Secondary | ICD-10-CM | POA: Diagnosis not present

## 2023-02-15 DIAGNOSIS — I25119 Atherosclerotic heart disease of native coronary artery with unspecified angina pectoris: Secondary | ICD-10-CM | POA: Diagnosis not present

## 2023-02-15 DIAGNOSIS — I428 Other cardiomyopathies: Secondary | ICD-10-CM

## 2023-02-15 DIAGNOSIS — F4321 Adjustment disorder with depressed mood: Secondary | ICD-10-CM

## 2023-02-15 DIAGNOSIS — F419 Anxiety disorder, unspecified: Secondary | ICD-10-CM

## 2023-02-15 NOTE — Progress Notes (Signed)
Cardiology Office Note:    Date:  02/15/2023  ID:  Kristen [name], DOB 04-05-1945, MRN [id number] PCP: Etta Grandchild, MD  [facility] Providers Cardiologist:  Tonny Bollman, MD       Patient Profile:      Kristen [name] is a 77 year old female with PMH of coronary disease s/p CABG in 2004, paroxysmal atrial fibrillation on warfarin, stroke, obesity, diabetes, hypertension, LBBB, chronic HFrEF/NICM with improved EF, fibromyalgia, panic/anxiety.   She had prior CABG in 2004. In February 2023 echo demonstrated decline in EF to 25-30%, previously normal.  GDMT was titrated.  In September 2023 she underwent right/left heart catheterization demonstrating patent coronary arteries without high-grade obstruction, normal LV and RV filling pressures, preserved cardiac output.  Last echo March 2024 EF 50-55%, grade 1 DD, trivial MR.  Monitor April 2023 showed NSR, average 86 bpm, rare PACs/PVCs, 1 SVT (9 beats).  She was previously deferred DOAC and lieu of warfarin due to cost.      History of Present Illness:  Discussed the use of AI scribe software for clinical note transcription with the patient, who gave verbal consent to proceed.  Kristen [name] is a 77 y.o. female who returns for 27-month follow-up for CAD, HFpEF, PAF.  Today, patient is doing well from a cardiovascular standpoint.  She has also been dealing with significant emotional stress due to the recent loss of her husband. She reports increased anxiety and feelings of loneliness. She has been reliving the traumatic event of her husband's passing and is struggling with her grief. She has not sought professional help for her emotional distress but has been considering it.  She notes some shortness of breath when she wakes up which she has related to her anxiety/panic.  She has not experienced any exertional angina or DOE.  She has not had weight gain or leg swelling.  She notes that she has not had much exercise as of  recently and she has not had much of an appetite as of recent. In addition, the patient reports symptoms of neuropathy in her feet, likely related to her diabetes. She is currently on gabapentin for this. She also mentions a significant weight loss in the past due to Ozempic, but her weight has been stable recently.  She denies any lightheadedness, dizziness, syncope, near syncope, orthopnea, PND.     Review of Systems  Constitutional: Negative for weight gain and weight loss.  Cardiovascular:  Negative for chest pain, claudication, cyanosis, dyspnea on exertion, irregular heartbeat, leg swelling, near-syncope, orthopnea, palpitations, paroxysmal nocturnal dyspnea and syncope.  Respiratory:  Positive for shortness of breath. Negative for cough and hemoptysis.   Gastrointestinal:  Negative for abdominal pain, hematochezia and melena.  Genitourinary:  Negative for hematuria.  Neurological:  Negative for dizziness, headaches and light-headedness.  Psychiatric/Behavioral:  Positive for depression.      See HPI    Studies Reviewed:       Echocardiogram 05/18/2022 1. Left ventricular ejection fraction, by estimation, is 50 to 55%. Left  ventricular ejection fraction by PLAX is 52 %. The left ventricle has low  normal function. The left ventricle demonstrates regional wall motion  abnormalities (see scoring  diagram/findings for description). Left ventricular diastolic parameters  are consistent with Grade I diastolic dysfunction (impaired relaxation).   2. Right ventricular systolic function is normal. The right ventricular  size is normal. There is normal pulmonary artery systolic pressure.   3. The mitral valve is normal in  structure. Trivial mitral valve  regurgitation. No evidence of mitral stenosis.   4. The aortic valve is tricuspid. Aortic valve regurgitation is trivial.  No aortic stenosis is present.   5. The inferior vena cava is normal in size with greater than 50%  respiratory  variability, suggesting right atrial pressure of 3 mmHg.  Right/Left Heart Catheterization 11/22/2021 1.  Patent coronary arteries with mild nonobstructive plaquing of the LAD, wide patency of the left main, circumflex, and RCA. 2.  Patent but atretic LIMA graft due to competitive flow through an LAD that has no high-grade stenosis 3.  Low normal LV and RV filling pressures, preserved cardiac output, and no evidence of pulmonary hypertension Recommend medical therapy for nonobstructive CAD and chronic systolic heart failure with LV dysfunction.  The patient's filling pressures are all in the low normal range and she appears to be very well compensated at present. Diagnostic Dominance: Right  Risk Assessment/Calculations:    CHA2DS2-VASc Score = 9   This indicates a 12.2% annual risk of stroke. The patient's score is based upon: CHF History: 1 HTN History: 1 Diabetes History: 1 Stroke History: 2 Vascular Disease History: 1 Age Score: 2 Gender Score: 1            Physical Exam:   VS:  BP 110/76 (BP Location: Right Arm, Patient Position: Sitting, Cuff Size: Normal)   Pulse 86   Resp 16   Ht 5' (1.524 m)   Wt 171 lb 3.2 oz (77.7 kg)   SpO2 95%   BMI 33.44 kg/m    Wt Readings from Last 3 Encounters:  02/15/23 171 lb 3.2 oz (77.7 kg)  02/08/23 172 lb (78 kg)  01/12/23 171 lb 12.8 oz (77.9 kg)    Constitutional:      Appearance: Normal and healthy appearance.  HENT:     Head: Normocephalic.  Neck:     Vascular: JVD normal.  Pulmonary:     Effort: Pulmonary effort is normal.     Breath sounds: Normal breath sounds.  Chest:     Chest wall: Not tender to palpatation.  Cardiovascular:     PMI at left midclavicular line. Normal rate. Regular rhythm. Normal S1. Normal S2.      Murmurs: There is no murmur.     No gallop.  No click. No rub.  Pulses:    Intact distal pulses.  Edema:    Peripheral edema absent.  Musculoskeletal: Normal range of motion.     Cervical back:  Normal range of motion and neck supple. Skin:    General: Skin is warm and dry.  Neurological:     General: No focal deficit present.     Mental Status: Alert, oriented to person, place, and time and oriented to person, place and time.  Psychiatric:        Attention and Perception: Attention and perception normal.        Mood and Affect: Mood normal.        Behavior: Behavior is cooperative.        Thought Content: Thought content normal.        Assessment and Plan:  Paroxysmal atrial fibrillation -CHA2DS2-VASc Score = 9 [CHF History: 1, HTN History: 1, Diabetes History: 1, Stroke History: 2, Vascular Disease History: 1, Age Score: 2, Gender Score: 1].  Therefore, the patient's annual risk of stroke is 12.2 %.     -She is clinically in NSR today, rate controlled, and denies any recent tachycardia, palpitations, and bleeding  concerns -Continue Coumadin per Coumadin clinic (INR 2.5 on 01/20/23).   Coronary artery disease / HLD -S/p CABG in 2004.  R/L heart cath completed 11/2021 showed patent coronaries and patent LIMA graft -She is not having chest pain or exertional angina -Stable with no anginal symptoms, no indication for ischemic evaluation  -LDL 46, TG 90 on 10/24/2021.  Plan for direct LDL today -Continue atorvastatin 40 mg once daily, metoprolol-XL 100 mg, nitroglycerin as needed.  Not on ASA due to [facility] -Focus on heart healthy dieting including increasing fiber, vegetables, fruit diet  Chronic HFrEF/NICM -February 2023 echo demonstrated decline in EF to 25-30%, previously normal, GDMT titrated and Echo March 2024 EF 50-55%, grade 1 DD -Today she is euvolemic and well compensated.  She has no complaints of SOB/DOE -Encouraged <2G sodium diet,<2L fluid restriction and daily weights -Continue GDMT Jardiance 25 mg, Lasix 20 mg, metoprolol-XL 100 mg, Entresto 97-23 mg twice daily, spironolactone 25 mg  Hypertension -BP today 110/76.  Well-controlled -Continue current  antihypertensive regimen -Encouraged 150 minutes of moderate intensity aerobic exercise weekly  Anxiety / grieving  -Reports significant anxiety and grief following the recent loss of her husband. Experiencing intrusive thoughts and reliving the event. -Recommend seeking therapy or counseling to help manage grief and anxiety.  -No SI/HI. She will follow with PCP            Dispo:  Return in about 6 months (around 08/16/2023).  Signed, Denyce Robert, NP

## 2023-02-15 NOTE — Patient Instructions (Signed)
Medication Instructions:   Your physician recommends that you continue on your current medications as directed. Please refer to the Current Medication list given to you today.  *If you need a refill on your cardiac medications before your next appointment, please call your pharmacy*   Lab Work:     PLEASE GO DOWN STAIRS FIRST FLOOR  [address] :  CMET  AND DIRECT LDL   If you have labs (blood work) drawn today and your tests are completely normal, you will receive your results only by: MyChart Message (if you have MyChart) OR A paper copy in the mail If you have any lab test that is abnormal or we need to change your treatment, we will call you to review the results.   Testing/Procedures: NONE ORDERED  TODAY    Follow-Up: At [facility], you and your health needs are our priority.  As part of our continuing mission to provide you with exceptional heart care, we have created designated Provider Care Teams.  These Care Teams include your primary Cardiologist (physician) and Advanced Practice Providers (APPs -  Physician Assistants and Nurse Practitioners) who all work together to provide you with the care you need, when you need it.  We recommend signing up for the patient portal called "MyChart".  Sign up information is provided on this After Visit Summary.  MyChart is used to connect with patients for Virtual Visits (Telemedicine).  Patients are able to view lab/test results, encounter notes, upcoming appointments, etc.  Non-urgent messages can be sent to your provider as well.   To learn more about what you can do with MyChart, go to [email or web address].    Your next appointment:   6 month(s)  Provider:   Tonny Bollman, MD  or Ronie Spies, PA-C, Robin Searing, NP, Eligha Bridegroom, NP, or Tereso Newcomer, PA-C       Other Instructions

## 2023-02-16 LAB — COMPREHENSIVE METABOLIC PANEL
ALT: 10 [IU]/L (ref 0–32)
AST: 18 [IU]/L (ref 0–40)
Albumin: 4.5 g/dL (ref 3.8–4.8)
Alkaline Phosphatase: 77 [IU]/L (ref 44–121)
BUN/Creatinine Ratio: 14 (ref 12–28)
BUN: 13 mg/dL (ref 8–27)
Bilirubin Total: 0.7 mg/dL (ref 0.0–1.2)
CO2: 20 mmol/L (ref 20–29)
Calcium: 10.4 mg/dL — ABNORMAL HIGH (ref 8.7–10.3)
Chloride: 103 mmol/L (ref 96–106)
Creatinine, Ser: 0.96 mg/dL (ref 0.57–1.00)
Globulin, Total: 3.4 g/dL (ref 1.5–4.5)
Glucose: 186 mg/dL — ABNORMAL HIGH (ref 70–99)
Potassium: 4.9 mmol/L (ref 3.5–5.2)
Sodium: 142 mmol/L (ref 134–144)
Total Protein: 7.9 g/dL (ref 6.0–8.5)
eGFR: 62 mL/min/{1.73_m2} (ref >59)

## 2023-02-16 LAB — LDL CHOLESTEROL, DIRECT: LDL Direct: 68 mg/dL (ref 0–99)

## 2023-02-20 ENCOUNTER — Ambulatory Visit: Payer: Medicare Other | Admitting: Internal Medicine

## 2023-02-20 DIAGNOSIS — M25511 Pain in right shoulder: Secondary | ICD-10-CM | POA: Diagnosis not present

## 2023-02-20 DIAGNOSIS — Z79891 Long term (current) use of opiate analgesic: Secondary | ICD-10-CM | POA: Diagnosis not present

## 2023-02-20 DIAGNOSIS — M150 Primary generalized (osteo)arthritis: Secondary | ICD-10-CM | POA: Diagnosis not present

## 2023-02-20 DIAGNOSIS — G894 Chronic pain syndrome: Secondary | ICD-10-CM | POA: Diagnosis not present

## 2023-02-21 ENCOUNTER — Telehealth: Payer: Self-pay

## 2023-02-21 ENCOUNTER — Other Ambulatory Visit (HOSPITAL_COMMUNITY): Payer: Self-pay

## 2023-02-21 MED ORDER — OXYCODONE-ACETAMINOPHEN 10-325 MG PO TABS
1.0000 | ORAL_TABLET | Freq: Every day | ORAL | 0 refills | Status: DC | PRN
  Filled 2023-03-04: qty 150, 30d supply, fill #0

## 2023-02-21 NOTE — Telephone Encounter (Addendum)
Came across application on med assist fax. Notification was not received when application was faxed.  PAP: Application for [name] has been submitted to PAP Companies: [employer], via fax. If patient requests an update in the meantime, please refer them to [employer] at [phone].

## 2023-02-24 ENCOUNTER — Other Ambulatory Visit (HOSPITAL_COMMUNITY): Payer: Self-pay

## 2023-02-24 ENCOUNTER — Ambulatory Visit (INDEPENDENT_AMBULATORY_CARE_PROVIDER_SITE_OTHER): Payer: Medicare Other

## 2023-02-24 DIAGNOSIS — D510 Vitamin B12 deficiency anemia due to intrinsic factor deficiency: Secondary | ICD-10-CM | POA: Diagnosis not present

## 2023-02-24 DIAGNOSIS — Z7901 Long term (current) use of anticoagulants: Secondary | ICD-10-CM

## 2023-02-24 LAB — POCT INR: INR: 3.5 — AB (ref 2.0–3.0)

## 2023-02-24 MED ORDER — CYANOCOBALAMIN 1000 MCG/ML IJ SOLN
1000.0000 ug | Freq: Once | INTRAMUSCULAR | Status: AC
  Administered 2023-02-24: 1000 ug via INTRAMUSCULAR

## 2023-02-24 NOTE — Progress Notes (Addendum)
Pt has not been eating many greens. She reports she does not have an appetite much anymore. Pt requested BP check due to her having dizzy/lightheadedness. Pt reports this occurs when she is lying in bed on her right side. No dizziness during change of position. BP today is 128/78 and HR is 81. Pt reported she has not been eating and thinks this symptoms could also be caused by low glucose. Gave pt some hard candy and she reports she was feeling better by the time she left. Advised if this symptoms continues to make apt with PCP to assess. Pt verbalized understanding.   Hold dose today and then change weekly dose to take 1 tablet daily. Recheck in 2 weeks.  Pt requested B12 injection. Last injection given on 12/16/22. Ok for injection  per PCP. Administered B12 injection. Pt tolerated well.

## 2023-02-24 NOTE — Patient Instructions (Addendum)
Pre visit review using our clinic review tool, if applicable. No additional management support is needed unless otherwise documented below in the visit note.  Hold dose today and then change weekly dose to take 1 tablet daily.  Re-check in 2 weeks.

## 2023-02-24 NOTE — Telephone Encounter (Signed)
PAP: Patient application pending due to [employer] IS REQUESTING COPY OF PT'S MOST RECENT TAX RETURN OR PROOF OF INCOME, MAILING LETTER TO PT

## 2023-03-02 ENCOUNTER — Other Ambulatory Visit (HOSPITAL_COMMUNITY): Payer: Self-pay

## 2023-03-02 ENCOUNTER — Ambulatory Visit: Payer: Medicare Other | Admitting: Obstetrics and Gynecology

## 2023-03-03 ENCOUNTER — Other Ambulatory Visit (HOSPITAL_COMMUNITY): Payer: Self-pay

## 2023-03-03 ENCOUNTER — Other Ambulatory Visit: Payer: Self-pay | Admitting: Internal Medicine

## 2023-03-03 DIAGNOSIS — F411 Generalized anxiety disorder: Secondary | ICD-10-CM

## 2023-03-04 ENCOUNTER — Other Ambulatory Visit (HOSPITAL_COMMUNITY): Payer: Self-pay

## 2023-03-06 ENCOUNTER — Other Ambulatory Visit: Payer: Self-pay

## 2023-03-07 ENCOUNTER — Other Ambulatory Visit (HOSPITAL_COMMUNITY): Payer: Self-pay

## 2023-03-09 ENCOUNTER — Telehealth: Payer: Self-pay

## 2023-03-09 ENCOUNTER — Other Ambulatory Visit: Payer: Self-pay | Admitting: Internal Medicine

## 2023-03-09 ENCOUNTER — Other Ambulatory Visit (HOSPITAL_BASED_OUTPATIENT_CLINIC_OR_DEPARTMENT_OTHER): Payer: Self-pay

## 2023-03-09 ENCOUNTER — Other Ambulatory Visit (HOSPITAL_COMMUNITY): Payer: Self-pay

## 2023-03-09 ENCOUNTER — Other Ambulatory Visit: Payer: Self-pay

## 2023-03-09 MED ORDER — ALPRAZOLAM 1 MG PO TABS
1.0000 mg | ORAL_TABLET | Freq: Three times a day (TID) | ORAL | 0 refills | Status: DC | PRN
Start: 2023-03-09 — End: 2023-04-10
  Filled 2023-03-09: qty 270, 90d supply, fill #0

## 2023-03-09 MED ORDER — MAGNESIUM OXIDE 400 MG PO TABS
1.0000 | ORAL_TABLET | Freq: Two times a day (BID) | ORAL | 0 refills | Status: DC
  Filled 2023-03-09: qty 180, 90d supply, fill #0

## 2023-03-09 NOTE — Telephone Encounter (Signed)
 Copied from CRM [id number]. Topic: Clinical - Medication Refill >> Mar 09, 2023  9:28 AM Robinson DEL wrote: Most Recent Primary Care Visit:  Provider: ETHYL KIRSCH  Department: [facility]  Visit Type: COUMADIN  CLINIC  Date: 02/24/2023  Medication: magnesium  oxide (MAG-OX) 400 MG tablet   Has the patient contacted their pharmacy? Yes, pharmacy states they sent over request but agent only seen request for the ALPRAZolam  (XANAX ) 1 MG tablet (Agent: If no, request that the patient contact the pharmacy for the refill. If patient does not wish to contact the pharmacy document the reason why and proceed with request.) (Agent: If yes, when and what did the pharmacy advise?)  Is this the correct pharmacy for this prescription? Yes If no, delete pharmacy and type the correct one.  This is the patient's preferred pharmacy:  [facility] - [facility] [address] [place] [address] Phone: [phone] Fax: [phone]   Has the prescription been filled recently? No  Is the patient out of the medication? Yes  Has the patient been seen for an appointment in the last year OR does the patient have an upcoming appointment?   Can we respond through MyChart?   Agent: Please be advised that Rx refills may take up to 3 business days. We ask that you follow-up with your pharmacy.

## 2023-03-09 NOTE — Telephone Encounter (Signed)
 Copied from CRM [id number]. Topic: Clinical - Medication Refill >> Mar 09, 2023  9:28 AM Robinson DEL wrote: Most Recent Primary Care Visit:  Provider: ETHYL KIRSCH  Department: [facility]  Visit Type: COUMADIN  CLINIC  Date: 02/24/2023  Medication: magnesium  oxide (MAG-OX) 400 MG tablet   Has the patient contacted their pharmacy? Yes, pharmacy states they sent over request but agent only seen request for the ALPRAZolam  (XANAX ) 1 MG tablet (Agent: If no, request that the patient contact the pharmacy for the refill. If patient does not wish to contact the pharmacy document the reason why and proceed with request.) (Agent: If yes, when and what did the pharmacy advise?)  Is this the correct pharmacy for this prescription? Yes If no, delete pharmacy and type the correct one.  This is the patient's preferred pharmacy:  [facility] - [facility] [address] [place] [address] Phone: [phone] Fax: [phone]   Has the prescription been filled recently? No  Is the patient out of the medication? Yes  Has the patient been seen for an appointment in the last year OR does the patient have an upcoming appointment? Yes  Can we respond through MyChart? Yes  Agent: Please be advised that Rx refills may take up to 3 business days. We ask that you follow-up with your pharmacy.

## 2023-03-10 ENCOUNTER — Other Ambulatory Visit (HOSPITAL_COMMUNITY): Payer: Self-pay

## 2023-03-10 ENCOUNTER — Ambulatory Visit (INDEPENDENT_AMBULATORY_CARE_PROVIDER_SITE_OTHER): Payer: Medicare Other

## 2023-03-10 ENCOUNTER — Other Ambulatory Visit: Payer: Self-pay

## 2023-03-10 DIAGNOSIS — Z7901 Long term (current) use of anticoagulants: Secondary | ICD-10-CM | POA: Diagnosis not present

## 2023-03-10 LAB — POCT INR: INR: 1.8 — AB (ref 2.0–3.0)

## 2023-03-10 NOTE — Telephone Encounter (Signed)
 Medication refill has been handled by other means of communication

## 2023-03-10 NOTE — Progress Notes (Signed)
 Pt reports eating a lot of greens for the new year celebration. She also reports she did not change her weekly dose to take 1 tablet daily. She continued 1 1/2 tablets on Wednesday. Updated calendar to reflect this.   Increase dose today to take 1 1/2 tablets and then continue 1 tablet daily except take 1 1/2 tablets on Wednesday. Recheck INR at appointment with Dr. Joshua with a lab draw. I will contact you the day after with dosing instructions.   Sent msg to PCP requesting lab INR for at apt on 1/20.

## 2023-03-10 NOTE — Patient Instructions (Addendum)
 Pre visit review using our clinic review tool, if applicable. No additional management support is needed unless otherwise documented below in the visit note.  Increase dose today to take 1 1/2 tablets and then continue 1 tablet daily except take 1 1/2 tablets on Wednesday. Recheck INR at appointment with Dr. Joshua with a lab draw. I will contact you the day after with dosing instructions.

## 2023-03-13 ENCOUNTER — Inpatient Hospital Stay (HOSPITAL_COMMUNITY)
Admission: EM | Admit: 2023-03-13 | Discharge: 2023-03-15 | DRG: 388 | Disposition: A | Discharge status: Home / Self Care | Payer: Medicare Other | Attending: Internal Medicine | Admitting: Internal Medicine

## 2023-03-13 ENCOUNTER — Emergency Department (HOSPITAL_COMMUNITY): Payer: Medicare Other

## 2023-03-13 DIAGNOSIS — Z8051 Family history of malignant neoplasm of kidney: Secondary | ICD-10-CM

## 2023-03-13 DIAGNOSIS — R6511 Systemic inflammatory response syndrome (SIRS) of non-infectious origin with acute organ dysfunction: Secondary | ICD-10-CM | POA: Diagnosis not present

## 2023-03-13 DIAGNOSIS — I5042 Chronic combined systolic (congestive) and diastolic (congestive) heart failure: Secondary | ICD-10-CM | POA: Diagnosis not present

## 2023-03-13 DIAGNOSIS — Z881 Allergy status to other antibiotic agents status: Secondary | ICD-10-CM

## 2023-03-13 DIAGNOSIS — E872 Acidosis, unspecified: Secondary | ICD-10-CM | POA: Diagnosis not present

## 2023-03-13 DIAGNOSIS — I429 Cardiomyopathy, unspecified: Secondary | ICD-10-CM | POA: Diagnosis not present

## 2023-03-13 DIAGNOSIS — N281 Cyst of kidney, acquired: Secondary | ICD-10-CM | POA: Diagnosis not present

## 2023-03-13 DIAGNOSIS — Z833 Family history of diabetes mellitus: Secondary | ICD-10-CM

## 2023-03-13 DIAGNOSIS — Z87891 Personal history of nicotine dependence: Secondary | ICD-10-CM

## 2023-03-13 DIAGNOSIS — A419 Sepsis, unspecified organism: Secondary | ICD-10-CM | POA: Diagnosis not present

## 2023-03-13 DIAGNOSIS — I2510 Atherosclerotic heart disease of native coronary artery without angina pectoris: Secondary | ICD-10-CM | POA: Diagnosis present

## 2023-03-13 DIAGNOSIS — M797 Fibromyalgia: Secondary | ICD-10-CM | POA: Diagnosis present

## 2023-03-13 DIAGNOSIS — Z8261 Family history of arthritis: Secondary | ICD-10-CM

## 2023-03-13 DIAGNOSIS — R739 Hyperglycemia, unspecified: Secondary | ICD-10-CM | POA: Diagnosis not present

## 2023-03-13 DIAGNOSIS — Z951 Presence of aortocoronary bypass graft: Secondary | ICD-10-CM | POA: Diagnosis not present

## 2023-03-13 DIAGNOSIS — E785 Hyperlipidemia, unspecified: Secondary | ICD-10-CM | POA: Diagnosis not present

## 2023-03-13 DIAGNOSIS — K5641 Fecal impaction: Principal | ICD-10-CM | POA: Diagnosis present

## 2023-03-13 DIAGNOSIS — Z79899 Other long term (current) drug therapy: Secondary | ICD-10-CM

## 2023-03-13 DIAGNOSIS — E860 Dehydration: Secondary | ICD-10-CM | POA: Diagnosis not present

## 2023-03-13 DIAGNOSIS — Z823 Family history of stroke: Secondary | ICD-10-CM

## 2023-03-13 DIAGNOSIS — K5900 Constipation, unspecified: Secondary | ICD-10-CM | POA: Diagnosis not present

## 2023-03-13 DIAGNOSIS — Z0389 Encounter for observation for other suspected diseases and conditions ruled out: Secondary | ICD-10-CM | POA: Diagnosis not present

## 2023-03-13 DIAGNOSIS — I110 Hypertensive heart disease with heart failure: Secondary | ICD-10-CM | POA: Diagnosis not present

## 2023-03-13 DIAGNOSIS — Z885 Allergy status to narcotic agent status: Secondary | ICD-10-CM

## 2023-03-13 DIAGNOSIS — Z79891 Long term (current) use of opiate analgesic: Secondary | ICD-10-CM

## 2023-03-13 DIAGNOSIS — Z888 Allergy status to other drugs, medicaments and biological substances status: Secondary | ICD-10-CM | POA: Diagnosis not present

## 2023-03-13 DIAGNOSIS — R109 Unspecified abdominal pain: Secondary | ICD-10-CM | POA: Diagnosis not present

## 2023-03-13 DIAGNOSIS — E119 Type 2 diabetes mellitus without complications: Secondary | ICD-10-CM | POA: Diagnosis not present

## 2023-03-13 DIAGNOSIS — Z8673 Personal history of transient ischemic attack (TIA), and cerebral infarction without residual deficits: Secondary | ICD-10-CM | POA: Diagnosis not present

## 2023-03-13 DIAGNOSIS — Z8249 Family history of ischemic heart disease and other diseases of the circulatory system: Secondary | ICD-10-CM | POA: Diagnosis not present

## 2023-03-13 DIAGNOSIS — K219 Gastro-esophageal reflux disease without esophagitis: Secondary | ICD-10-CM | POA: Diagnosis not present

## 2023-03-13 DIAGNOSIS — I480 Paroxysmal atrial fibrillation: Secondary | ICD-10-CM | POA: Diagnosis present

## 2023-03-13 DIAGNOSIS — M549 Dorsalgia, unspecified: Secondary | ICD-10-CM | POA: Diagnosis present

## 2023-03-13 DIAGNOSIS — I447 Left bundle-branch block, unspecified: Secondary | ICD-10-CM | POA: Diagnosis not present

## 2023-03-13 DIAGNOSIS — R6889 Other general symptoms and signs: Secondary | ICD-10-CM | POA: Diagnosis not present

## 2023-03-13 DIAGNOSIS — Z66 Do not resuscitate: Secondary | ICD-10-CM | POA: Diagnosis present

## 2023-03-13 DIAGNOSIS — Z818 Family history of other mental and behavioral disorders: Secondary | ICD-10-CM

## 2023-03-13 DIAGNOSIS — G894 Chronic pain syndrome: Secondary | ICD-10-CM | POA: Diagnosis present

## 2023-03-13 DIAGNOSIS — Z882 Allergy status to sulfonamides status: Secondary | ICD-10-CM

## 2023-03-13 DIAGNOSIS — Z7984 Long term (current) use of oral hypoglycemic drugs: Secondary | ICD-10-CM | POA: Diagnosis not present

## 2023-03-13 DIAGNOSIS — Z7901 Long term (current) use of anticoagulants: Secondary | ICD-10-CM

## 2023-03-13 DIAGNOSIS — Z800 Family history of malignant neoplasm of digestive organs: Secondary | ICD-10-CM

## 2023-03-13 DIAGNOSIS — Z79818 Long term (current) use of other agents affecting estrogen receptors and estrogen levels: Secondary | ICD-10-CM

## 2023-03-13 DIAGNOSIS — K5903 Drug induced constipation: Secondary | ICD-10-CM | POA: Diagnosis not present

## 2023-03-13 LAB — COMPREHENSIVE METABOLIC PANEL
ALT: 15 U/L (ref 0–44)
AST: 24 U/L (ref 15–41)
Albumin: 4.4 g/dL (ref 3.5–5.0)
Alkaline Phosphatase: 66 U/L (ref 38–126)
Anion gap: 17 — ABNORMAL HIGH (ref 5–15)
BUN: 19 mg/dL (ref 8–23)
CO2: 17 mmol/L — ABNORMAL LOW (ref 22–32)
Calcium: 9.6 mg/dL (ref 8.9–10.3)
Chloride: 97 mmol/L — ABNORMAL LOW (ref 98–111)
Creatinine, Ser: 0.97 mg/dL (ref 0.44–1.00)
GFR, Estimated: >60 mL/min (ref >60)
Glucose, Bld: 219 mg/dL — ABNORMAL HIGH (ref 70–99)
Potassium: 3.5 mmol/L (ref 3.5–5.1)
Sodium: 131 mmol/L — ABNORMAL LOW (ref 135–145)
Total Bilirubin: 1.2 mg/dL (ref 0.0–1.2)
Total Protein: 8.6 g/dL — ABNORMAL HIGH (ref 6.5–8.1)

## 2023-03-13 LAB — CBC
HCT: 46.8 % — ABNORMAL HIGH (ref 36.0–46.0)
Hemoglobin: 14.8 g/dL (ref 12.0–15.0)
MCH: 28.5 pg (ref 26.0–34.0)
MCHC: 31.6 g/dL (ref 30.0–36.0)
MCV: 90.2 fL (ref 80.0–100.0)
Platelets: 321 10*3/uL (ref 150–400)
RBC: 5.19 MIL/uL — ABNORMAL HIGH (ref 3.87–5.11)
RDW: 17.1 % — ABNORMAL HIGH (ref 11.5–15.5)
WBC: 15.4 10*3/uL — ABNORMAL HIGH (ref 4.0–10.5)
nRBC: 0 % (ref 0.0–0.2)

## 2023-03-13 LAB — I-STAT CG4 LACTIC ACID, ED: Lactic Acid, Venous: 3.3 mmol/L (ref 0.5–1.9)

## 2023-03-13 LAB — LIPASE, BLOOD: Lipase: 22 U/L (ref 11–51)

## 2023-03-13 MED ORDER — LACTATED RINGERS IV SOLN: INTRAVENOUS | Status: DC

## 2023-03-13 MED ORDER — ONDANSETRON HCL 4 MG/2ML IJ SOLN
4.0000 mg | Freq: Once | INTRAMUSCULAR | Status: AC
  Administered 2023-03-14: 4 mg via INTRAVENOUS
  Filled 2023-03-13: qty 2

## 2023-03-13 MED ORDER — HYDROMORPHONE HCL 1 MG/ML IJ SOLN
0.5000 mg | Freq: Once | INTRAMUSCULAR | Status: AC
  Administered 2023-03-14: 0.5 mg via INTRAVENOUS
  Filled 2023-03-13: qty 1

## 2023-03-13 MED ORDER — LACTATED RINGERS IV BOLUS (SEPSIS)
1000.0000 mL | Freq: Once | INTRAVENOUS | Status: AC
  Administered 2023-03-14: 1000 mL via INTRAVENOUS

## 2023-03-13 MED ORDER — SODIUM CHLORIDE 0.9 % IV SOLN
2.0000 g | Freq: Once | INTRAVENOUS | Status: AC
  Administered 2023-03-14: 2 g via INTRAVENOUS
  Filled 2023-03-13: qty 20

## 2023-03-13 MED ORDER — METRONIDAZOLE 500 MG/100ML IV SOLN
500.0000 mg | Freq: Once | INTRAVENOUS | Status: AC
  Administered 2023-03-14: 500 mg via INTRAVENOUS
  Filled 2023-03-13: qty 100

## 2023-03-13 MED ORDER — LACTATED RINGERS IV BOLUS (SEPSIS)
500.0000 mL | Freq: Once | INTRAVENOUS | Status: AC
  Administered 2023-03-14: 500 mL via INTRAVENOUS

## 2023-03-13 NOTE — ED Triage Notes (Signed)
 Pt from [facility], for constipation x 4-5 days , supposed to be taking mirlax but forgets. Today started having rectal and vaginal pain. Hx of vaginal prolapse per pt with a pessy. Pt c/o abdomen generalized tenderness but no pain. Pt reports hx of admission for constipation. CBG 288 per EMS, hx DM 2

## 2023-03-13 NOTE — ED Notes (Signed)
Unsuccessful IV attempt x 3 

## 2023-03-13 NOTE — Sepsis Progress Note (Addendum)
 Elink monitoring for the code sepsis protocol.   0205: Notified bedside nurse of need to draw 2nd lactic acid.   33: Notified provider of need to order 3rd lactic acid.

## 2023-03-13 NOTE — ED Notes (Signed)
 [place] PIV placed.

## 2023-03-14 ENCOUNTER — Other Ambulatory Visit (HOSPITAL_COMMUNITY): Payer: Self-pay

## 2023-03-14 ENCOUNTER — Emergency Department (HOSPITAL_COMMUNITY): Payer: Medicare Other

## 2023-03-14 DIAGNOSIS — I2510 Atherosclerotic heart disease of native coronary artery without angina pectoris: Secondary | ICD-10-CM | POA: Diagnosis present

## 2023-03-14 DIAGNOSIS — M549 Dorsalgia, unspecified: Secondary | ICD-10-CM | POA: Diagnosis present

## 2023-03-14 DIAGNOSIS — G894 Chronic pain syndrome: Secondary | ICD-10-CM | POA: Diagnosis present

## 2023-03-14 DIAGNOSIS — Z951 Presence of aortocoronary bypass graft: Secondary | ICD-10-CM | POA: Diagnosis not present

## 2023-03-14 DIAGNOSIS — I429 Cardiomyopathy, unspecified: Secondary | ICD-10-CM | POA: Diagnosis present

## 2023-03-14 DIAGNOSIS — N281 Cyst of kidney, acquired: Secondary | ICD-10-CM | POA: Diagnosis not present

## 2023-03-14 DIAGNOSIS — K5900 Constipation, unspecified: Secondary | ICD-10-CM | POA: Diagnosis present

## 2023-03-14 DIAGNOSIS — E872 Acidosis, unspecified: Secondary | ICD-10-CM | POA: Diagnosis present

## 2023-03-14 DIAGNOSIS — Z8673 Personal history of transient ischemic attack (TIA), and cerebral infarction without residual deficits: Secondary | ICD-10-CM | POA: Diagnosis not present

## 2023-03-14 DIAGNOSIS — K5903 Drug induced constipation: Secondary | ICD-10-CM | POA: Diagnosis not present

## 2023-03-14 DIAGNOSIS — I5042 Chronic combined systolic (congestive) and diastolic (congestive) heart failure: Secondary | ICD-10-CM | POA: Diagnosis present

## 2023-03-14 DIAGNOSIS — Z7984 Long term (current) use of oral hypoglycemic drugs: Secondary | ICD-10-CM | POA: Diagnosis not present

## 2023-03-14 DIAGNOSIS — I447 Left bundle-branch block, unspecified: Secondary | ICD-10-CM | POA: Diagnosis present

## 2023-03-14 DIAGNOSIS — K5641 Fecal impaction: Secondary | ICD-10-CM | POA: Diagnosis present

## 2023-03-14 DIAGNOSIS — E119 Type 2 diabetes mellitus without complications: Secondary | ICD-10-CM | POA: Diagnosis present

## 2023-03-14 DIAGNOSIS — M797 Fibromyalgia: Secondary | ICD-10-CM | POA: Diagnosis present

## 2023-03-14 DIAGNOSIS — E785 Hyperlipidemia, unspecified: Secondary | ICD-10-CM | POA: Diagnosis present

## 2023-03-14 DIAGNOSIS — Z8249 Family history of ischemic heart disease and other diseases of the circulatory system: Secondary | ICD-10-CM | POA: Diagnosis not present

## 2023-03-14 DIAGNOSIS — Z7901 Long term (current) use of anticoagulants: Secondary | ICD-10-CM | POA: Diagnosis not present

## 2023-03-14 DIAGNOSIS — I110 Hypertensive heart disease with heart failure: Secondary | ICD-10-CM | POA: Diagnosis present

## 2023-03-14 DIAGNOSIS — R109 Unspecified abdominal pain: Secondary | ICD-10-CM | POA: Diagnosis not present

## 2023-03-14 DIAGNOSIS — Z888 Allergy status to other drugs, medicaments and biological substances status: Secondary | ICD-10-CM | POA: Diagnosis not present

## 2023-03-14 DIAGNOSIS — Z66 Do not resuscitate: Secondary | ICD-10-CM | POA: Diagnosis present

## 2023-03-14 DIAGNOSIS — E860 Dehydration: Secondary | ICD-10-CM | POA: Diagnosis present

## 2023-03-14 DIAGNOSIS — K219 Gastro-esophageal reflux disease without esophagitis: Secondary | ICD-10-CM | POA: Diagnosis present

## 2023-03-14 DIAGNOSIS — I480 Paroxysmal atrial fibrillation: Secondary | ICD-10-CM | POA: Diagnosis present

## 2023-03-14 DIAGNOSIS — Z87891 Personal history of nicotine dependence: Secondary | ICD-10-CM | POA: Diagnosis not present

## 2023-03-14 DIAGNOSIS — R6511 Systemic inflammatory response syndrome (SIRS) of non-infectious origin with acute organ dysfunction: Secondary | ICD-10-CM | POA: Diagnosis present

## 2023-03-14 LAB — URINALYSIS, ROUTINE W REFLEX MICROSCOPIC
Bilirubin Urine: NEGATIVE
Glucose, UA: >=500 mg/dL — AB
Hgb urine dipstick: NEGATIVE
Ketones, ur: NEGATIVE mg/dL
Leukocytes,Ua: NEGATIVE
Nitrite: NEGATIVE
Protein, ur: NEGATIVE mg/dL
Specific Gravity, Urine: 1.015 (ref 1.005–1.030)
pH: 5 (ref 5.0–8.0)

## 2023-03-14 LAB — BLOOD GAS, VENOUS
Acid-Base Excess: 0.6 mmol/L (ref 0.0–2.0)
Bicarbonate: 26.6 mmol/L (ref 20.0–28.0)
O2 Saturation: 24.5 %
Patient temperature: 37
pCO2, Ven: 47 mm[Hg] (ref 44–60)
pH, Ven: 7.36 (ref 7.25–7.43)
pO2, Ven: <31 mm[Hg] — CL (ref 32–45)

## 2023-03-14 LAB — CBC WITH DIFFERENTIAL/PLATELET
Abs Immature Granulocytes: 0.04 10*3/uL (ref 0.00–0.07)
Basophils Absolute: 0 10*3/uL (ref 0.0–0.1)
Basophils Relative: 0 %
Eosinophils Absolute: 0 10*3/uL (ref 0.0–0.5)
Eosinophils Relative: 0 %
HCT: 38.6 % (ref 36.0–46.0)
Hemoglobin: 12.5 g/dL (ref 12.0–15.0)
Immature Granulocytes: 0 %
Lymphocytes Relative: 17 %
Lymphs Abs: 2.1 10*3/uL (ref 0.7–4.0)
MCH: 28.8 pg (ref 26.0–34.0)
MCHC: 32.4 g/dL (ref 30.0–36.0)
MCV: 88.9 fL (ref 80.0–100.0)
Monocytes Absolute: 1.1 10*3/uL — ABNORMAL HIGH (ref 0.1–1.0)
Monocytes Relative: 9 %
Neutro Abs: 9.1 10*3/uL — ABNORMAL HIGH (ref 1.7–7.7)
Neutrophils Relative %: 74 %
Platelets: 287 10*3/uL (ref 150–400)
RBC: 4.34 MIL/uL (ref 3.87–5.11)
RDW: 17.4 % — ABNORMAL HIGH (ref 11.5–15.5)
WBC: 12.3 10*3/uL — ABNORMAL HIGH (ref 4.0–10.5)
nRBC: 0 % (ref 0.0–0.2)

## 2023-03-14 LAB — CBG MONITORING, ED
Glucose-Capillary: 148 mg/dL — ABNORMAL HIGH (ref 70–99)
Glucose-Capillary: 148 mg/dL — ABNORMAL HIGH (ref 70–99)
Glucose-Capillary: 168 mg/dL — ABNORMAL HIGH (ref 70–99)
Glucose-Capillary: 143 mg/dL — ABNORMAL HIGH (ref 70–99)

## 2023-03-14 LAB — BASIC METABOLIC PANEL
Anion gap: 13 (ref 5–15)
BUN: 19 mg/dL (ref 8–23)
CO2: 21 mmol/L — ABNORMAL LOW (ref 22–32)
Calcium: 8.8 mg/dL — ABNORMAL LOW (ref 8.9–10.3)
Chloride: 101 mmol/L (ref 98–111)
Creatinine, Ser: 0.91 mg/dL (ref 0.44–1.00)
GFR, Estimated: >60 mL/min (ref >60)
Glucose, Bld: 169 mg/dL — ABNORMAL HIGH (ref 70–99)
Potassium: 3.8 mmol/L (ref 3.5–5.1)
Sodium: 135 mmol/L (ref 135–145)

## 2023-03-14 LAB — I-STAT CG4 LACTIC ACID, ED
Lactic Acid, Venous: 4.8 mmol/L (ref 0.5–1.9)
Lactic Acid, Venous: 3.3 mmol/L (ref 0.5–1.9)

## 2023-03-14 LAB — PROTIME-INR
INR: 2.5 — ABNORMAL HIGH (ref 0.8–1.2)
Prothrombin Time: 26.8 s — ABNORMAL HIGH (ref 11.4–15.2)

## 2023-03-14 LAB — LACTIC ACID, PLASMA: Lactic Acid, Venous: 2.3 mmol/L (ref 0.5–1.9)

## 2023-03-14 LAB — BRAIN NATRIURETIC PEPTIDE: B Natriuretic Peptide: 73.6 pg/mL (ref 0.0–100.0)

## 2023-03-14 LAB — BETA-HYDROXYBUTYRIC ACID: Beta-Hydroxybutyric Acid: 0.46 mmol/L — ABNORMAL HIGH (ref 0.05–0.27)

## 2023-03-14 LAB — PROCALCITONIN: Procalcitonin: 0.14 ng/mL

## 2023-03-14 LAB — APTT: aPTT: 35 s (ref 24–36)

## 2023-03-14 MED ORDER — WARFARIN SODIUM 1 MG PO TABS
1.5000 mg | ORAL_TABLET | Freq: Once | ORAL | Status: AC
  Administered 2023-03-14: 1.5 mg via ORAL
  Filled 2023-03-14 (×2): qty 1

## 2023-03-14 MED ORDER — SENNOSIDES-DOCUSATE SODIUM 8.6-50 MG PO TABS
1.0000 | ORAL_TABLET | Freq: Two times a day (BID) | ORAL | Status: DC
  Administered 2023-03-14 – 2023-03-15 (×3): 1 via ORAL
  Filled 2023-03-14 (×3): qty 1

## 2023-03-14 MED ORDER — POLYETHYLENE GLYCOL 3350 17 G PO PACK
17.0000 g | PACK | Freq: Two times a day (BID) | ORAL | Status: DC
  Administered 2023-03-14 – 2023-03-15 (×3): 17 g via ORAL
  Filled 2023-03-14 (×3): qty 1

## 2023-03-14 MED ORDER — INSULIN ASPART 100 UNIT/ML IJ SOLN
0.0000 [IU] | INTRAMUSCULAR | Status: DC
  Administered 2023-03-14 (×2): 1 [IU] via SUBCUTANEOUS
  Administered 2023-03-14: 2 [IU] via SUBCUTANEOUS
  Administered 2023-03-14: 1 [IU] via SUBCUTANEOUS
  Filled 2023-03-14: qty 0.09

## 2023-03-14 MED ORDER — SMOG ENEMA
960.0000 mL | Freq: Once | RECTAL | Status: DC
  Filled 2023-03-14: qty 960

## 2023-03-14 MED ORDER — OXYCODONE-ACETAMINOPHEN 10-325 MG PO TABS: 1.0000 | ORAL_TABLET | Freq: Three times a day (TID) | ORAL | Status: DC | PRN

## 2023-03-14 MED ORDER — METOPROLOL SUCCINATE ER 50 MG PO TB24
100.0000 mg | ORAL_TABLET | Freq: Two times a day (BID) | ORAL | Status: DC
  Administered 2023-03-14 – 2023-03-15 (×3): 100 mg via ORAL
  Filled 2023-03-14 (×3): qty 2

## 2023-03-14 MED ORDER — ATORVASTATIN CALCIUM 40 MG PO TABS
40.0000 mg | ORAL_TABLET | Freq: Every day | ORAL | Status: DC
  Administered 2023-03-14 – 2023-03-15 (×2): 40 mg via ORAL
  Filled 2023-03-14 (×2): qty 1

## 2023-03-14 MED ORDER — OXYCODONE HCL 5 MG PO TABS: 5.0000 mg | ORAL_TABLET | Freq: Three times a day (TID) | ORAL | Status: DC | PRN

## 2023-03-14 MED ORDER — OXYCODONE-ACETAMINOPHEN 5-325 MG PO TABS: 1.0000 | ORAL_TABLET | Freq: Three times a day (TID) | ORAL | Status: DC | PRN

## 2023-03-14 MED ORDER — OXYCODONE-ACETAMINOPHEN 10-325 MG PO TABS: 1.0000 | ORAL_TABLET | Freq: Every day | ORAL | 0 refills | Status: DC

## 2023-03-14 MED ORDER — WARFARIN - PHARMACIST DOSING INPATIENT: Freq: Every day | Status: DC

## 2023-03-14 MED ORDER — EMPAGLIFLOZIN 25 MG PO TABS: 25.0000 mg | ORAL_TABLET | Freq: Every day | ORAL | Status: DC

## 2023-03-14 MED ORDER — BISACODYL 10 MG RE SUPP
10.0000 mg | Freq: Once | RECTAL | Status: DC
  Filled 2023-03-14: qty 1

## 2023-03-14 MED ORDER — IOHEXOL 300 MG/ML  SOLN
100.0000 mL | Freq: Once | INTRAMUSCULAR | Status: AC | PRN
  Administered 2023-03-14: 100 mL via INTRAVENOUS

## 2023-03-14 MED ORDER — GABAPENTIN 300 MG PO CAPS
300.0000 mg | ORAL_CAPSULE | Freq: Three times a day (TID) | ORAL | Status: DC
  Administered 2023-03-14 – 2023-03-15 (×2): 300 mg via ORAL
  Filled 2023-03-14 (×2): qty 1

## 2023-03-14 MED ORDER — SODIUM CHLORIDE 0.9 % IV SOLN
2.0000 g | INTRAVENOUS | Status: DC
  Administered 2023-03-14: 2 g via INTRAVENOUS
  Filled 2023-03-14: qty 20

## 2023-03-14 MED ORDER — LACTATED RINGERS IV SOLN: INTRAVENOUS | Status: AC

## 2023-03-14 MED ORDER — ALPRAZOLAM 0.5 MG PO TABS
1.0000 mg | ORAL_TABLET | Freq: Three times a day (TID) | ORAL | Status: DC | PRN
  Administered 2023-03-14 – 2023-03-15 (×2): 1 mg via ORAL
  Filled 2023-03-14 (×2): qty 2

## 2023-03-14 NOTE — ED Notes (Signed)
 Dinner tray provided for pt

## 2023-03-14 NOTE — ED Notes (Signed)
 CBG 168.

## 2023-03-14 NOTE — Progress Notes (Signed)
 PHARMACY - ANTICOAGULATION CONSULT NOTE  Pharmacy Consult for Warfarin Indication: atrial fibrillation  Allergies  Allergen Reactions   Metformin  And Related Other (See Comments)    Must take XR form only, cannot tolerate Regular release metformin    Cleocin  [Clindamycin  Hcl] Diarrhea   Codeine Itching   Doxycycline Hyclate Diarrhea   Macrolides And Ketolides Diarrhea   Morphine Hives   Pentazocine Lactate Itching and Nausea And Vomiting    (GENERIC- Talwin)   Vibramycin [Doxycycline Calcium ] Diarrhea and Rash   Clindamycin  Diarrhea   Definity  [Perflutren  Lipid Microsphere] Other (See Comments)    Patient complained of warm sensation in chest x few seconds duration when injected Definity .No other symptoms   Doxycycline Diarrhea   Metformin  Diarrhea    Must take long acting can not take regular   Pentazocine Nausea Only   Sulfa Antibiotics Diarrhea and Rash   Sulfonamide Derivatives Rash    Patient Measurements: Height: 5' (152.4 cm) Weight: 77.6 kg (171 lb) IBW/kg (Calculated) : 45.5 Heparin  Dosing Weight:   Vital Signs: Temp: 98.2 F (36.8 C) (01/07 0607) Temp Source: Oral (01/07 0607) BP: 128/67 (01/07 0607) Pulse Rate: 100 (01/07 0607)  Labs: Recent Labs    03/13/23 2011 03/14/23 0508  HGB 14.8  --   HCT 46.8*  --   PLT 321  --   APTT  --  35  LABPROT  --  26.8*  INR  --  2.5*  CREATININE 0.97 0.91    Estimated Creatinine Clearance: 49.2 mL/min (by C-G formula based on SCr of 0.91 mg/dL).   Medical History: Past Medical History:  Diagnosis Date   Arthritis    Blood transfusion without reported diagnosis    Bursitis    CAD (coronary artery disease)    a. s/p CABG 2004.b. stable cath 2014 demonstrating stable CAD and continued patency of her LIMA graft.   Chronic anticoagulation    on coumadin    DDD (degenerative disc disease), lumbar    Depression    Diabetes mellitus    Diastolic dysfunction    per echo in October 2012 with EF 50 to 55%    Fibromyalgia    GERD (gastroesophageal reflux disease) 10/23/2003   Headache(784.0)    Hyperlipidemia    Hypertension    Hypokalemia    LBBB (left bundle branch block)    Lumbar back pain    LV dysfunction    a. EF 45% by cath 2014. b. EF 50-55% by technically difficult echo in 08/2014.   Lymphadenitis    Morbid obesity (HCC)    a. Sleep study negative for significant OSA in 11/2014.   PAF (paroxysmal atrial fibrillation) (HCC)    Stroke (HCC) 2004   affected speech per pt    Medications:  Scheduled:   bisacodyl   10 mg Rectal Once   insulin  aspart  0-9 Units Subcutaneous Q4H   polyethylene glycol  17 g Oral BID   senna-docusate  1 tablet Oral BID   SMOG  960 mL Rectal Once   Infusions:   lactated ringers  125 mL/hr at 03/14/23 9171   Prior to admission warfarin:  3mg  daily except 4.5 mg on Wednesdays.  Warfarin clinic visit with INR 1.8 on 03/10/23.  Last dose prior to admission not yet recorded, med history pending.    Assessment: 72 yoF presented to ED on 1/6 with abdominal pain thought to be due to constipation.  Pharmacy is consulted to dose Warfarin for Afib.    INR 2.5, therapeutic CBC: Hgb and  Plt WNL No s/s bleeding or complications noted.   Diet: clear liquids only Drug-drug interactions:  Ceftriaxone  and metronidazole  given x1 dose on 1/7.    Goal of Therapy:  INR 2-3 Monitor platelets by anticoagulation protocol: Yes   Plan:  Warfarin 1.5 mg PO at 16:00 today.  Daily PT/INR. Monitor for signs and symptoms of bleeding.   Kristen [name] PharmD, BCPS [facility] main pharmacy [phone] 03/14/2023 8:37 AM

## 2023-03-14 NOTE — ED Notes (Signed)
 Emptied urine of approx 500 ml urine with small stool in pan.

## 2023-03-14 NOTE — ED Provider Notes (Signed)
   Accepted handoff at shift change from [facility]. Please see prior provider note for more detail.   Briefly: Patient is 78 y.o. abdominal pain thought to be due to constipation. She reports that she is supposed take MiraLAX  but frequently forgets to take it. She states that she is on chronic pain medication which is caused constipation in the past. She denies vomiting but does endorse some mild nausea. She reports having to be admitted in the past due to constipation. Patient denies chest pain, shortness of breath, fever. Past medical history significant type II DM, cognitive dysfunction associate with depression, hypertension, GERD, chronic narcotic usage, anticoagulation with warfarin.  DDX: concern for  sepsis,*colitis, enteritis, sepsis of unknown origin, constipation, others   Plan:  -right after I received handoff for this patient around 6:45AM, Dr. Alfornia called and was able to talk to Ubaldo RIGGERS. They agreed to admit patient. Dr. Donnamarie admitting provider.   Hoy Nidia FALCON, [place] 03/14/23 9193    Griselda Norris, MD 03/16/23 1041

## 2023-03-14 NOTE — H&P (Signed)
 History and Physical    Patient: Kristen [name] FMW:[id number] DOB: Sep 06, 1945 DOA: 03/13/2023 DOS: the patient was seen and examined on 03/14/2023 PCP: Joshua Debby CROME, MD  Patient coming from: [facility]  Chief Complaint:  Chief Complaint  Patient presents with   Constipation   HPI: Kristen [name] is a 78 y.o. female with medical history significant of CAD s/p CABG, paroxysmal A-fib on chronic anticoagulation with Coumadin , degenerative disc disease, diabetes mellitus type 2, fibromyalgia, HTN, HLD, history of CVA, presents to the ED complaining of generalized abdominal pain, with significant constipation for the past several days.  Of note, in May 2023, patient was admitted for constipation requiring multiple disimpactions with laxatives.  Patient does take chronic opioids for her fibromyalgia/chronic back pain.  Patient denies any nausea/vomiting, chest pain, fever/chills, dysuria, cough, shortness of breath.  In the ED, patient noted to be hemodynamically stable except for some mild tachycardia, labs showed leukocytosis WBC 15.4, lactic acid 3.3--4.8, INR 2.5 (on warfarin for [facility]), all other labs WNL.  Chest x-ray are unremarkable.  UA/UC pending collection, BC x 2 pending.  CT abdomen/pelvis showed large stool burden in the rectum and throughout the colon suggesting fecal impaction and constipation.  No evidence of stercoral colitis.  Patient was given IV fluids and started on IV antibiotics empirically.  [facility] hospitalist consulted for admission for further management.    Review of Systems: As mentioned in the history of present illness. All other systems reviewed and are negative.     Past Medical History:  Diagnosis Date   Arthritis    Blood transfusion without reported diagnosis    Bursitis    CAD (coronary artery disease)    a. s/p CABG 2004.b. stable cath 2014 demonstrating stable CAD and continued patency of her LIMA graft.   Chronic anticoagulation    on  coumadin    DDD (degenerative disc disease), lumbar    Depression    Diabetes mellitus    Diastolic dysfunction    per echo in October 2012 with EF 50 to 55%   Fibromyalgia    GERD (gastroesophageal reflux disease) 10/23/2003   Headache(784.0)    Hyperlipidemia    Hypertension    Hypokalemia    LBBB (left bundle branch block)    Lumbar back pain    LV dysfunction    a. EF 45% by cath 2014. b. EF 50-55% by technically difficult echo in 08/2014.   Lymphadenitis    Morbid obesity (HCC)    a. Sleep study negative for significant OSA in 11/2014.   PAF (paroxysmal atrial fibrillation) (HCC)    Stroke (HCC) 2004   affected speech per pt   Past Surgical History:  Procedure Laterality Date   ABDOMINAL HYSTERECTOMY     ANGIOPLASTY  laminectomy   CHOLECYSTECTOMY     CORONARY ARTERY BYPASS GRAFT     LIMA to LAD    KNEE ARTHROSCOPY     LEFT HEART CATHETERIZATION WITH CORONARY/GRAFT ANGIOGRAM  12/07/2012   Procedure: LEFT HEART CATHETERIZATION WITH EL BILE;  Surgeon: Ozell JONETTA Fell, MD;  Location: [facility] CATH LAB;  Service: Cardiovascular;;   LUMBAR LAMINECTOMY     x3   RIGHT/LEFT HEART CATH AND CORONARY/GRAFT ANGIOGRAPHY N/A 11/22/2021   Procedure: RIGHT/LEFT HEART CATH AND CORONARY/GRAFT ANGIOGRAPHY;  Surgeon: Fell Ozell, MD;  Location: [facility] INVASIVE CV LAB;  Service: Cardiovascular;  Laterality: N/A;   SPHINCTEROTOMY     TONSILLECTOMY     Social History:  reports that she quit smoking about  22 years ago. Her smoking use included cigarettes. She has been exposed to tobacco smoke. She has never used smokeless tobacco. She reports current drug use. Drug: Oxycodone . She reports that she does not drink alcohol.  Allergies  Allergen Reactions   Metformin  And Related Other (See Comments)    Must take XR form only, cannot tolerate Regular release metformin    Cleocin  [Clindamycin  Hcl] Diarrhea   Codeine Itching   Doxycycline Hyclate Diarrhea   Macrolides And Ketolides  Diarrhea   Morphine Hives   Pentazocine Lactate Itching and Nausea And Vomiting    (GENERIC- Talwin)   Vibramycin [Doxycycline Calcium ] Diarrhea and Rash   Clindamycin  Diarrhea   Definity  [Perflutren  Lipid Microsphere] Other (See Comments)    Patient complained of warm sensation in chest x few seconds duration when injected Definity .No other symptoms   Doxycycline Diarrhea   Metformin  Diarrhea    Must take long acting can not take regular   Pentazocine Nausea Only   Sulfa Antibiotics Diarrhea and Rash   Sulfonamide Derivatives Rash    Family History  Problem Relation Age of Onset   Stroke Mother    Hypertension Mother    Arthritis Mother    Heart attack Father    Heart disease Father    Depression Sister    Heart attack Sister    Kidney cancer Sister    Hypertension Brother    Diabetes Brother    Arthritis Brother    Anxiety disorder Maternal Aunt    Diabetes Maternal Aunt        x 2   Colon cancer Paternal Uncle    Hypertension Son    Obesity Son     Prior to Admission medications   Medication Sig Start Date End Date Taking? Authorizing Provider  ALPRAZolam  (XANAX ) 1 MG tablet Take 1 tablet (1 mg total) by mouth 3 (three) times daily as needed. 03/09/23   Joshua Debby CROME, MD  atorvastatin  (LIPITOR) 40 MG tablet Take 1 tablet (40 mg total) by mouth daily. 01/27/23   Joshua Debby CROME, MD  COMFORT EZ PEN NEEDLES 31G X 8 MM MISC USE WITH insulin  AS DIRECTED 02/22/22   Joshua Debby CROME, MD  Continuous Glucose Receiver (DEXCOM G7 RECEIVER) DEVI 1 Act by Does not apply route daily. 10/20/22   Joshua Debby CROME, MD  Continuous Glucose Sensor (DEXCOM G7 SENSOR) MISC 1 Act by Does not apply route daily. 10/20/22   Joshua Debby CROME, MD  Cyanocobalamin  (B-12 COMPLIANCE INJECTION IJ) Inject as directed.    [provider]  cyclobenzaprine  (FLEXERIL ) 10 MG tablet Take 10 mg by mouth 3 (three) times daily as needed. 02/18/22   [provider]  diclofenac  Sodium (VOLTAREN ) 1 %  GEL Apply 2 g topically 4 (four) times daily. Patient taking differently: Apply 2 g topically 4 (four) times daily. As needed 07/27/21   Sherrill Cable Latif, DO  estradiol  (ESTRACE ) 0.1 MG/GM vaginal cream Place 0.5 g vaginally 2 (two) times a week. Place 0.5g nightly for two weeks then twice a week after 01/12/23   Zuleta, Kaitlin G, NP  furosemide  (LASIX ) 20 MG tablet Take 1 tablet (20 mg total) by mouth daily. 01/12/23   Joshua Debby CROME, MD  gabapentin  (NEURONTIN ) 300 MG capsule TAKE ONE CAPSULE BY MOUTH THREE TIMES DAILY 10/02/22   Joshua Debby CROME, MD  insulin  glargine, 2 Unit Dial , (TOUJEO  MAX SOLOSTAR) 300 UNIT/ML Solostar Pen Inject 50 Units into the skin daily. 10/20/22   Joshua Debby CROME, MD  JARDIANCE  25 MG TABS tablet Take 1 tablet (25 mg total) by mouth daily. 08/10/22   Joshua Debby CROME, MD  magnesium  oxide (MAG-OX) 400 MG tablet Take 1 tablet (400 mg total) by mouth 2 (two) times daily. 03/09/23   Joshua Debby CROME, MD  metFORMIN  (GLUCOPHAGE -XR) 500 MG 24 hr tablet TAKE (3) TABLETS BY MOUTH EVERY MORNING WITH BREAKFAST 11/07/22   Joshua Debby CROME, MD  metoprolol  succinate (TOPROL -XL) 100 MG 24 hr tablet Take 1 tablet (100 mg total) by mouth in the morning and at bedtime. 01/31/23   Joshua Debby CROME, MD  nitroGLYCERIN  (NITROSTAT ) 0.4 MG SL tablet Place 1 tablet (0.4 mg total) under the tongue every 5 (five) minutes as needed for chest pain (x 3 doses). Reported on 05/08/2015 08/12/21   Wonda Sharper, MD  oxyCODONE -acetaminophen  (PERCOCET) 10-325 MG tablet Take 1 tablet by mouth 5 (five) times daily as needed for pain 02/01/23     oxyCODONE -acetaminophen  (PERCOCET) 10-325 MG tablet Take 1 tablet by mouth 5 (five) times daily as needed for pain. 02/20/23     oxyCODONE -acetaminophen  (PERCOCET) 10-325 MG tablet Take 1 tablet by mouth 5 (five) times daily as needed for pain. 04/03/23     polyethylene glycol (MIRALAX  / GLYCOLAX ) 17 g packet Take 17 g by mouth daily. Patient taking differently: Take 17 g by mouth  as needed. 07/27/21   Sheikh, Alejandro Latif, DO  potassium chloride  SA (KLOR-CON  M) 20 MEQ tablet Take 1 tablet (20 mEq total) by mouth in the morning and at bedtime. 01/25/23   Joshua Debby CROME, MD  sacubitril -valsartan  (ENTRESTO ) 97-103 MG Take 1 tablet by mouth 2 (two) times daily. 05/11/22   Wonda Sharper, MD  Semaglutide  (RYBELSUS ) 7 MG TABS Take 1 tablet (7 mg total) by mouth daily. 07/12/22   Joshua Debby CROME, MD  spironolactone  (ALDACTONE ) 25 MG tablet TAKE 1 TABLET BY MOUTH ONCE DAILY *REFILL REQUEST* 10/28/22   Joshua Debby CROME, MD  Vibegron  75 MG TABS Take 1 tablet (75 mg total) by mouth daily. 01/30/23   Zuleta, Kaitlin G, NP  warfarin (COUMADIN ) 3 MG tablet TAKE ONE TABLET BY MOUTH DAILY EXCEPT TAKE 1 1/2 TABLETS ON WEDNESDAYS OR AS DIRECTED BY ANTICOAGULATION CLINIC. 01/12/23   Joshua Debby CROME, MD    Physical Exam: Vitals:   03/14/23 0607 03/14/23 0900 03/14/23 1050 03/14/23 1154  BP: 128/67 125/79 122/66 (!) 118/44  Pulse: 100 (!) 103 (!) 103 95  Resp: 18   18  Temp: 98.2 F (36.8 C)   98.2 F (36.8 C)  TempSrc: Oral   Oral  SpO2: 94% 97% 96% 94%  Weight:      Height:       General: NAD  Cardiovascular: S1, S2 present Respiratory: CTAB Abdomen: Soft, nontender, nondistended, bowel sounds present Musculoskeletal: No bilateral pedal edema noted Skin: Normal Psychiatry: Normal mood     Data Reviewed: Data reviewed as noted in my H&P   Assessment and Plan:  Constipation/fecal impaction CT abdomen/pelvis showed large stool burden in the rectum and throughout the colon suggesting fecal impaction and constipation.  No evidence of stercoral colitis. Strict bowel regimen- RN order for disimpaction, enemas, stool softeners, laxatives Clear liquid diet, IV fluid Minimize opioids  Lactic acidosis ??  Concern for infection/sepsis Noted to be afebrile, with tachycardia, leukocytosis (may be reactive) Lactic acidosis may indicate dehydration, rule out infection, will trend  3.3->4.8->3.3 UA/UC pending collection, BC x 2 pending Chest x-ray unremarkable CT abdomen/pelvis showing fecal impaction Continue IV fluids  Continue empiric ceftriaxone  for now, discontinue pending workup  Diabetes mellitus type 2 Last A1c 9.8 on 10/20/2022, repeat pending CBGs well-controlled SSI's, Accu-Cheks, hypoglycemic protocol, holding home glargine for now PTA Jardiance , metformin   Chronic systolic and diastolic HF/cardiomyopathy CAD s/p CABG Currently chest pain-free BNP pending Appears clinically dry Last echo done on 05/2022 showed EF of 50 to 55%, regional wall motion abnormality, grade 1 diastolic dysfunction Due to soft BP, hold home Entresto , Aldactone , Lasix , restart pending BP/volume status Continue metoprolol   Hypertension BP soft Hold home Entresto , Aldactone , Lasix  for now Continue metoprolol   Paroxysmal A-fib Noted some mild tachycardia, EKG pending Continue metoprolol  Warfarin per pharmacy  Chronic pain syndrome Fibromyalgia Takes Percocet 10mg  5 times daily as needed for pain, Flexeril  prn, gabapentin , Xanax  prn Reduced Percocet dose to avoid withdrawal, continue gabapentin , Xanax  as needed    Advance Care Planning: DNR  Consults: None  Family Communication: None at bedside  Severity of Illness: The appropriate patient status for this patient is OBSERVATION. Observation status is judged to be reasonable and necessary in order to provide the required intensity of service to ensure the patient's safety. The patient's presenting symptoms, physical exam findings, and initial radiographic and laboratory data in the context of their medical condition is felt to place them at decreased risk for further clinical deterioration. Furthermore, it is anticipated that the patient will be medically stable for discharge from the hospital within 2 midnights of admission.   Author: Lebron JINNY Cage, MD 03/14/2023 1:20 PM  For on call review [email or web address].

## 2023-03-14 NOTE — ED Notes (Signed)
 Pt has been able to transfer to [facility], family at bedside.

## 2023-03-14 NOTE — ED Provider Notes (Signed)
 [place] EMERGENCY DEPARTMENT AT [facility] Provider Note   CSN: [id number] Arrival date & time: 03/13/23  1857     History  Chief Complaint  Patient presents with   Constipation    Kristen [name] is a 78 y.o. female.  Patient presents to the emergency department from [facility] due to abdominal pain thought to be due to constipation.  She reports that she is supposed take MiraLAX  but frequently forgets to take it.  She states that she is on chronic pain medication which is caused constipation in the past.  She denies vomiting but does endorse some mild nausea.  She reports having to be admitted in the past due to constipation.  Patient denies chest pain, shortness of breath, fever.  Past medical history significant type II DM, cognitive dysfunction associate with depression, hypertension, GERD, chronic narcotic usage, anticoagulation with warfarin.   Constipation      Home Medications Prior to Admission medications   Medication Sig Start Date End Date Taking? Authorizing Provider  ALPRAZolam  (XANAX ) 1 MG tablet Take 1 tablet (1 mg total) by mouth 3 (three) times daily as needed. 03/09/23   Joshua Debby CROME, MD  atorvastatin  (LIPITOR) 40 MG tablet Take 1 tablet (40 mg total) by mouth daily. 01/27/23   Joshua Debby CROME, MD  COMFORT EZ PEN NEEDLES 31G X 8 MM MISC USE WITH insulin  AS DIRECTED 02/22/22   Joshua Debby CROME, MD  Continuous Glucose Receiver (DEXCOM G7 RECEIVER) DEVI 1 Act by Does not apply route daily. 10/20/22   Joshua Debby CROME, MD  Continuous Glucose Sensor (DEXCOM G7 SENSOR) MISC 1 Act by Does not apply route daily. 10/20/22   Joshua Debby CROME, MD  Cyanocobalamin  (B-12 COMPLIANCE INJECTION IJ) Inject as directed.    [provider]  cyclobenzaprine  (FLEXERIL ) 10 MG tablet Take 10 mg by mouth 3 (three) times daily as needed. 02/18/22   [provider]  diclofenac  Sodium (VOLTAREN ) 1 % GEL Apply 2 g topically 4 (four) times daily. Patient  taking differently: Apply 2 g topically 4 (four) times daily. As needed 07/27/21   Sherrill Cable Latif, DO  estradiol  (ESTRACE ) 0.1 MG/GM vaginal cream Place 0.5 g vaginally 2 (two) times a week. Place 0.5g nightly for two weeks then twice a week after 01/12/23   Zuleta, Kaitlin G, NP  furosemide  (LASIX ) 20 MG tablet Take 1 tablet (20 mg total) by mouth daily. 01/12/23   Joshua Debby CROME, MD  gabapentin  (NEURONTIN ) 300 MG capsule TAKE ONE CAPSULE BY MOUTH THREE TIMES DAILY 10/02/22   Joshua Debby CROME, MD  insulin  glargine, 2 Unit Dial , (TOUJEO  MAX SOLOSTAR) 300 UNIT/ML Solostar Pen Inject 50 Units into the skin daily. 10/20/22   Joshua Debby CROME, MD  JARDIANCE  25 MG TABS tablet Take 1 tablet (25 mg total) by mouth daily. 08/10/22   Joshua Debby CROME, MD  magnesium  oxide (MAG-OX) 400 MG tablet Take 1 tablet (400 mg total) by mouth 2 (two) times daily. 03/09/23   Joshua Debby CROME, MD  metFORMIN  (GLUCOPHAGE -XR) 500 MG 24 hr tablet TAKE (3) TABLETS BY MOUTH EVERY MORNING WITH BREAKFAST 11/07/22   Joshua Debby CROME, MD  metoprolol  succinate (TOPROL -XL) 100 MG 24 hr tablet Take 1 tablet (100 mg total) by mouth in the morning and at bedtime. 01/31/23   Joshua Debby CROME, MD  nitroGLYCERIN  (NITROSTAT ) 0.4 MG SL tablet Place 1 tablet (0.4 mg total) under the tongue every 5 (five) minutes as needed for chest pain (x  3 doses). Reported on 05/08/2015 08/12/21   Wonda Sharper, MD  oxyCODONE -acetaminophen  (PERCOCET) 10-325 MG tablet Take 1 tablet by mouth 5 (five) times daily as needed for pain 02/01/23     oxyCODONE -acetaminophen  (PERCOCET) 10-325 MG tablet Take 1 tablet by mouth 5 (five) times daily as needed for pain. 02/20/23     polyethylene glycol (MIRALAX  / GLYCOLAX ) 17 g packet Take 17 g by mouth daily. Patient taking differently: Take 17 g by mouth as needed. 07/27/21   Sherrill Cable Latif, DO  potassium chloride  SA (KLOR-CON  M) 20 MEQ tablet Take 1 tablet (20 mEq total) by mouth in the morning and at bedtime. 01/25/23   Joshua Debby CROME, MD  sacubitril -valsartan  (ENTRESTO ) 97-103 MG Take 1 tablet by mouth 2 (two) times daily. 05/11/22   Wonda Sharper, MD  Semaglutide  (RYBELSUS ) 7 MG TABS Take 1 tablet (7 mg total) by mouth daily. 07/12/22   Joshua Debby CROME, MD  spironolactone  (ALDACTONE ) 25 MG tablet TAKE 1 TABLET BY MOUTH ONCE DAILY *REFILL REQUEST* 10/28/22   Joshua Debby CROME, MD  Vibegron  75 MG TABS Take 1 tablet (75 mg total) by mouth daily. 01/30/23   Zuleta, Kaitlin G, NP  warfarin (COUMADIN ) 3 MG tablet TAKE ONE TABLET BY MOUTH DAILY EXCEPT TAKE 1 1/2 TABLETS ON WEDNESDAYS OR AS DIRECTED BY ANTICOAGULATION CLINIC. 01/12/23   Joshua Debby CROME, MD      Allergies    Metformin  and related, Cleocin  [clindamycin  hcl], Codeine, Doxycycline hyclate, Macrolides and ketolides, Morphine, Pentazocine lactate, Vibramycin [doxycycline calcium ], Clindamycin , Definity  [perflutren  lipid microsphere], Doxycycline, Metformin , Pentazocine, Sulfa antibiotics, and Sulfonamide derivatives    Review of Systems   Review of Systems  Gastrointestinal:  Positive for constipation.    Physical Exam Updated Vital Signs BP 108/61 (BP Location: Right Arm)   Pulse (!) 101   Temp 98.7 F (37.1 C) (Oral)   Resp 16   Ht 5' (1.524 m)   Wt 77.6 kg   SpO2 93%   BMI 33.40 kg/m  Physical Exam Vitals and nursing note reviewed.  Constitutional:      Appearance: She is well-developed. She is obese.  HENT:     Head: Normocephalic and atraumatic.  Eyes:     Conjunctiva/sclera: Conjunctivae normal.  Cardiovascular:     Rate and Rhythm: Regular rhythm. Tachycardia present.  Pulmonary:     Effort: Pulmonary effort is normal. No respiratory distress.     Breath sounds: Normal breath sounds.  Abdominal:     Palpations: Abdomen is soft.     Tenderness: There is abdominal tenderness (Generalized).  Musculoskeletal:        General: No swelling.     Cervical back: Neck supple.  Skin:    General: Skin is warm and dry.     Capillary Refill:  Capillary refill takes less than 2 seconds.  Neurological:     Mental Status: She is alert.  Psychiatric:        Mood and Affect: Mood normal.     ED Results / Procedures / Treatments   Labs (all labs ordered are listed, but only abnormal results are displayed) Labs Reviewed  COMPREHENSIVE METABOLIC PANEL - Abnormal; Notable for the following components:      Result Value   Sodium 131 (*)    Chloride 97 (*)    CO2 17 (*)    Glucose, Bld 219 (*)    Total Protein 8.6 (*)    Anion gap 17 (*)    All other components  within normal limits  CBC - Abnormal; Notable for the following components:   WBC 15.4 (*)    RBC 5.19 (*)    HCT 46.8 (*)    RDW 17.1 (*)    All other components within normal limits  BLOOD GAS, VENOUS - Abnormal; Notable for the following components:   pO2, Ven <31 (*)    All other components within normal limits  I-STAT CG4 LACTIC ACID, ED - Abnormal; Notable for the following components:   Lactic Acid, Venous 3.3 (*)    All other components within normal limits  I-STAT CG4 LACTIC ACID, ED - Abnormal; Notable for the following components:   Lactic Acid, Venous 4.8 (*)    All other components within normal limits  CULTURE, BLOOD (ROUTINE X 2)  CULTURE, BLOOD (ROUTINE X 2)  LIPASE, BLOOD  URINALYSIS, ROUTINE W REFLEX MICROSCOPIC  BETA-HYDROXYBUTYRIC ACID  PROTIME-INR  BASIC METABOLIC PANEL  APTT    EKG None  Radiology CT ABDOMEN PELVIS W CONTRAST Result Date: 03/14/2023 CLINICAL DATA:  Abdominal pain. Severe constipation. Concern for stercoral colitis EXAM: CT ABDOMEN AND PELVIS WITH CONTRAST TECHNIQUE: Multidetector CT imaging of the abdomen and pelvis was performed using the standard protocol following bolus administration of intravenous contrast. RADIATION DOSE REDUCTION: This exam was performed according to the departmental dose-optimization program which includes automated exposure control, adjustment of the mA and/or kV according to patient size and/or  use of iterative reconstruction technique. CONTRAST:  OMNIPAQUE  IOHEXOL  300 MG/ML  SOLN COMPARISON:  07/22/2021 FINDINGS: Lower chest: No acute abnormality Hepatobiliary: No focal liver abnormality is seen. Status post cholecystectomy. No biliary dilatation. Pancreas: No focal abnormality or ductal dilatation. Spleen: No focal abnormality.  Normal size. Adrenals/Urinary Tract: 1.4 cm cyst in the midpole of the left kidney. No follow-up imaging recommended. No stones or hydronephrosis. Adrenal glands and urinary bladder unremarkable. Stomach/Bowel: Large stool burden in the rectum suggesting fecal impaction. No evidence of stercoral colitis. Large stool burden throughout the remainder of the colon suggesting constipation. Stomach and small bowel decompressed, unremarkable. Vascular/Lymphatic: Aortic atherosclerosis. No evidence of aneurysm or adenopathy. Reproductive: Prior hysterectomy. No adnexal masses. Structure in the vagina, presumably pessary. Other: No free fluid or free air. Musculoskeletal: No acute bony abnormality. IMPRESSION: Large stool burden in the rectum and throughout the colon suggesting fecal impaction and constipation. No evidence of stercoral colitis. Aortic atherosclerosis. Electronically Signed   By: Franky Crease M.D.   On: 03/14/2023 01:15   DG Chest 2 View Result Date: 03/13/2023 CLINICAL DATA:  Query sepsis, constipation EXAM: CHEST - 2 VIEW COMPARISON:  07/19/2022 FINDINGS: Stable cardiomediastinal silhouette. Sternotomy and CABG. Left basilar atelectasis or scarring. Otherwise no focal consolidation, pleural effusion, or pneumothorax. No displaced rib fractures. Elevated right hemidiaphragm containing gaseous distention of the colon at the hepatic flexure. IMPRESSION: No active cardiopulmonary disease. Electronically Signed   By: Norman Gatlin M.D.   On: 03/13/2023 23:54    Procedures .Critical Care  Performed by: Logan Ubaldo NOVAK, PA-C Authorized by: Logan Ubaldo NOVAK,  PA-C   Critical care provider statement:    Critical care time (minutes):  30   Critical care was necessary to treat or prevent imminent or life-threatening deterioration of the following conditions:  Sepsis   Critical care was time spent personally by me on the following activities:  Development of treatment plan with patient or surrogate, discussions with consultants, evaluation of patient's response to treatment, examination of patient, ordering and review of laboratory studies, ordering and review  of radiographic studies, ordering and performing treatments and interventions, pulse oximetry, re-evaluation of patient's condition and review of old charts   Care discussed with: admitting provider       Medications Ordered in ED Medications  lactated ringers  infusion ( Intravenous New Bag/Given 03/14/23 0506)  HYDROmorphone  (DILAUDID ) injection 0.5 mg (0.5 mg Intravenous Given 03/14/23 0021)  ondansetron  (ZOFRAN ) injection 4 mg (4 mg Intravenous Given 03/14/23 0019)  cefTRIAXone  (ROCEPHIN ) 2 g in sodium chloride  0.9 % 100 mL IVPB (0 g Intravenous Stopped 03/14/23 0454)  metroNIDAZOLE  (FLAGYL ) IVPB 500 mg (0 mg Intravenous Stopped 03/14/23 0455)  lactated ringers  bolus 1,000 mL (0 mLs Intravenous Stopped 03/14/23 0455)    And  lactated ringers  bolus 1,000 mL (0 mLs Intravenous Stopped 03/14/23 0455)    And  lactated ringers  bolus 500 mL (0 mLs Intravenous Stopped 03/14/23 0455)  iohexol  (OMNIPAQUE ) 300 MG/ML solution 100 mL (100 mLs Intravenous Contrast Given 03/14/23 0026)    ED Course/ Medical Decision Making/ A&P Clinical Course as of 03/14/23 0532  Mon Mar 13, 2023  2330 Code sepsis activated [LM]    Clinical Course User Index [LM] Logan Ubaldo KATHEE DEVONNA                                 Medical Decision Making Amount and/or Complexity of Data Reviewed Labs: ordered. Radiology: ordered.  Risk Prescription drug management. Decision regarding hospitalization.   This patient presents to the  ED for concern of constipation, criteria meeting sepsis criteria, this involves an extensive number of treatment options, and is a complaint that carries with it a high risk of complications and morbidity.  The differential diagnosis includes sepsis,*colitis, enteritis, sepsis of unknown origin, constipation, others   Co morbidities that complicate the patient evaluation  Chronic pain, warfarin usage, chronic constipation   Additional history obtained:  Additional history obtained from son at bedside External records from outside source obtained and reviewed including discharge summary from May 2023 when patient was admitted due to constipation, patient required multiple disimpactions, mineral oil enema, had recommendations to minimize opiate use and was supposed to follow bowel regimen   Lab Tests:  I Ordered, and personally interpreted labs.  The pertinent results include: Initial lactic acid 3.3, repeat 4.8; leukocytosis with white count of 15,400, grossly unremarkable venous blood gas   Imaging Studies ordered:  I ordered imaging studies including chest x-ray and CT abdomen pelvis with contrast I independently visualized and interpreted imaging which showed no active disease on chest x-ray, CT abdomen pelvis shows: Large stool burden in the rectum and throughout the colon suggesting  fecal impaction and constipation. No evidence of stercoral colitis.    Aortic atherosclerosis.   I agree with the radiologist interpretation   Consultations Obtained:  I requested consultation with the hospitalist but due to patient volume did not hear back during my shift  Problem List / ED Course / Critical interventions / Medication management   I ordered medication including Flagyl , Rocephin  for antibiotic coverage, lactated Ringer 's bolus for fluid resuscitation, Dilaudid  for pain Reevaluation of the patient after these medicines showed that the patient improved I have reviewed the  patients home medicines and have made adjustments as needed   Test / Admission - Considered:  Patient with worsening lactic acid and apparent sepsis of unknown etiology.  Patient reports constipation but no wounds identified, no stercoral colitis. Dr.Rees performed digital pelvic exam which the patient tolerated  well, no discharge noted.  Patient started on antibiotics and given fluid bolus.  Patient needs admission for further evaluation and management.   Patient care transferred to [facility], PA-C.  Planning admission once hospitalist is consulted         Final Clinical Impression(s) / ED Diagnoses Final diagnoses:  Sepsis, due to unspecified organism, unspecified whether acute organ dysfunction present [facility])  Constipation, unspecified constipation type    Rx / DC Orders ED Discharge Orders     None         Logan Ubaldo KATHEE DEVONNA 03/14/23 0640    Griselda Norris, MD 03/16/23 1041

## 2023-03-15 DIAGNOSIS — K5903 Drug induced constipation: Secondary | ICD-10-CM | POA: Diagnosis not present

## 2023-03-15 LAB — CBC
HCT: 34.8 % — ABNORMAL LOW (ref 36.0–46.0)
Hemoglobin: 11.4 g/dL — ABNORMAL LOW (ref 12.0–15.0)
MCH: 29.2 pg (ref 26.0–34.0)
MCHC: 32.8 g/dL (ref 30.0–36.0)
MCV: 89 fL (ref 80.0–100.0)
Platelets: 227 10*3/uL (ref 150–400)
RBC: 3.91 MIL/uL (ref 3.87–5.11)
RDW: 17.7 % — ABNORMAL HIGH (ref 11.5–15.5)
WBC: 6.8 10*3/uL (ref 4.0–10.5)
nRBC: 0 % (ref 0.0–0.2)

## 2023-03-15 LAB — PROCALCITONIN: Procalcitonin: <0.1 ng/mL

## 2023-03-15 LAB — BASIC METABOLIC PANEL
Anion gap: 10 (ref 5–15)
BUN: 14 mg/dL (ref 8–23)
CO2: 22 mmol/L (ref 22–32)
Calcium: 8.4 mg/dL — ABNORMAL LOW (ref 8.9–10.3)
Chloride: 102 mmol/L (ref 98–111)
Creatinine, Ser: 0.59 mg/dL (ref 0.44–1.00)
GFR, Estimated: >60 mL/min (ref >60)
Glucose, Bld: 99 mg/dL (ref 70–99)
Potassium: 3.2 mmol/L — ABNORMAL LOW (ref 3.5–5.1)
Sodium: 134 mmol/L — ABNORMAL LOW (ref 135–145)

## 2023-03-15 LAB — CBG MONITORING, ED
Glucose-Capillary: 102 mg/dL — ABNORMAL HIGH (ref 70–99)
Glucose-Capillary: 107 mg/dL — ABNORMAL HIGH (ref 70–99)
Glucose-Capillary: 116 mg/dL — ABNORMAL HIGH (ref 70–99)
Glucose-Capillary: 96 mg/dL (ref 70–99)

## 2023-03-15 LAB — LACTIC ACID, PLASMA: Lactic Acid, Venous: 1 mmol/L (ref 0.5–1.9)

## 2023-03-15 MED ORDER — ALBUTEROL SULFATE (2.5 MG/3ML) 0.083% IN NEBU: 2.5000 mg | INHALATION_SOLUTION | RESPIRATORY_TRACT | Status: DC | PRN

## 2023-03-15 MED ORDER — ONDANSETRON HCL 4 MG/2ML IJ SOLN: 4.0000 mg | Freq: Four times a day (QID) | INTRAMUSCULAR | Status: DC | PRN

## 2023-03-15 MED ORDER — ACETAMINOPHEN 650 MG RE SUPP
650.0000 mg | Freq: Four times a day (QID) | RECTAL | Status: DC | PRN
Start: 2023-03-15 — End: 2023-03-15

## 2023-03-15 MED ORDER — ONDANSETRON HCL 4 MG PO TABS: 4.0000 mg | ORAL_TABLET | Freq: Four times a day (QID) | ORAL | Status: DC | PRN

## 2023-03-15 MED ORDER — LACTATED RINGERS IV SOLN: INTRAVENOUS | Status: AC

## 2023-03-15 MED ORDER — ACETAMINOPHEN 325 MG PO TABS: 650.0000 mg | ORAL_TABLET | Freq: Four times a day (QID) | ORAL | Status: DC | PRN

## 2023-03-15 NOTE — Discharge Summary (Signed)
 Physician Discharge Summary  Kristen [name] FMW:[id number] DOB: December 01, 1945 DOA: 03/13/2023  PCP: Joshua Debby CROME, MD  Admit date: 03/13/2023 Discharge date: 03/15/2023  Admitted From: Home Disposition: Home  Recommendations for Outpatient Follow-up:  Follow up with PCP in 1-2 weeks  Home Health: None Equipment/Devices: None  Discharge Condition: Stable CODE STATUS: DNR Diet recommendation: Low-salt low-fat low-carb diet  Brief/Interim Summary: Kristen [name] is a 78 y.o. female with medical history significant of CAD s/p CABG, paroxysmal A-fib on chronic anticoagulation with Coumadin , degenerative disc disease, diabetes mellitus type 2, fibromyalgia, HTN, HLD, history of CVA, presents to the ED complaining of generalized abdominal pain, with significant constipation for the past several days.  Patient admitted with notable constipation confirmed on imaging, after increased bowel prep IV fluids and supportive care patient multiple bowel movements overnight with essentially resolved symptoms and otherwise stable and agreeable for discharge home.  Patient's lab abnormalities in the setting of poor p.o. intake and profound constipation appear to be resolving.  No issues tolerating p.o. today and otherwise stable and agreeable for discharge home.  Given negative procalcitonin, resolved lactic acidosis and otherwise unremarkable labs antibiotics were discontinued as infection is less likely than previously considered at intake.  Follow-up with PCP in the next 1 to 2 weeks, discussed need for increased bowel regimen at home as well as to maintain appropriate fluid intake to avoid further constipation.   Discharge Diagnoses:  Principal Problem:   Constipation    Discharge Instructions   Allergies as of 03/15/2023       Reactions   Metformin  And Related Diarrhea, Other (See Comments)   Must take XR form only, cannot tolerate Regular release metformin    Cleocin  [clindamycin  Hcl]  Diarrhea   Codeine Itching   Macrolides And Ketolides Diarrhea   Morphine Hives   Pentazocine Lactate Itching, Nausea And Vomiting   (GENERIC- Talwin)   Vibramycin [doxycycline Calcium ] Diarrhea, Rash   Definity  [perflutren  Lipid Microsphere] Other (See Comments)   Patient complained of warm sensation in chest x few seconds duration when injected Definity .No other symptoms   Doxycycline Diarrhea   Pentazocine Nausea Only   Sulfa Antibiotics Diarrhea, Rash        Medication List     TAKE these medications    ALPRAZolam  1 MG tablet Commonly known as: XANAX  Take 1 tablet (1 mg total) by mouth 3 (three) times daily as needed. What changed: when to take this   atorvastatin  40 MG tablet Commonly known as: LIPITOR Take 1 tablet (40 mg total) by mouth daily. What changed: when to take this   Comfort EZ Pen Needles 31G X 8 MM Misc Generic drug: Insulin  Pen Needle USE WITH insulin  AS DIRECTED   cyclobenzaprine  10 MG tablet Commonly known as: FLEXERIL  Take 10 mg by mouth 3 (three) times daily as needed for muscle spasms.   diclofenac  Sodium 1 % Gel Commonly known as: VOLTAREN  Apply 2 g topically 4 (four) times daily. What changed:  when to take this reasons to take this   Entresto  97-103 MG Generic drug: sacubitril -valsartan  Take 1 tablet by mouth 2 (two) times daily.   estradiol  0.1 MG/GM vaginal cream Commonly known as: ESTRACE  Place 0.5 g vaginally 2 (two) times a week. Place 0.5g nightly for two weeks then twice a week after What changed: additional instructions   furosemide  20 MG tablet Commonly known as: LASIX  Take 1 tablet (20 mg total) by mouth daily.   gabapentin  300 MG capsule Commonly known as: NEURONTIN   TAKE ONE CAPSULE BY MOUTH THREE TIMES DAILY What changed:  when to take this additional instructions   Jardiance  25 MG Tabs tablet Generic drug: empagliflozin  Take 1 tablet (25 mg total) by mouth daily.   magnesium  oxide 400 MG tablet Commonly  known as: MAG-OX Take 1 tablet (400 mg total) by mouth 2 (two) times daily.   METAMUCIL PO Take 1 capsule by mouth daily.   metFORMIN  500 MG 24 hr tablet Commonly known as: GLUCOPHAGE -XR TAKE (3) TABLETS BY MOUTH EVERY MORNING WITH BREAKFAST What changed: See the new instructions.   metoprolol  succinate 100 MG 24 hr tablet Commonly known as: TOPROL -XL Take 1 tablet (100 mg total) by mouth in the morning and at bedtime.   nitroGLYCERIN  0.4 MG SL tablet Commonly known as: NITROSTAT  Place 1 tablet (0.4 mg total) under the tongue every 5 (five) minutes as needed for chest pain (x 3 doses). Reported on 05/08/2015   oxyCODONE -acetaminophen  10-325 MG tablet Commonly known as: Percocet Take 1 tablet by mouth 5 (five) times daily as needed for pain. What changed:  when to take this additional instructions Another medication with the same name was removed. Continue taking this medication, and follow the directions you see here.   potassium chloride  SA 20 MEQ tablet Commonly known as: KLOR-CON  M Take 1 tablet (20 mEq total) by mouth in the morning and at bedtime.   Rybelsus  7 MG Tabs Generic drug: Semaglutide  Take 1 tablet (7 mg total) by mouth daily.   spironolactone  25 MG tablet Commonly known as: ALDACTONE  TAKE 1 TABLET BY MOUTH ONCE DAILY *REFILL REQUEST* What changed: See the new instructions.   Vitamin Deficiency System-B12 1000 MCG/ML Kit Generic drug: Cyanocobalamin  Inject 1,000 mcg as directed every 30 (thirty) days.   warfarin 3 MG tablet Commonly known as: COUMADIN  Take as directed. If you are unsure how to take this medication, talk to your nurse or doctor. Original instructions: TAKE ONE TABLET BY MOUTH DAILY EXCEPT TAKE 1 1/2 TABLETS ON WEDNESDAYS OR AS DIRECTED BY ANTICOAGULATION CLINIC. What changed:  how much to take how to take this when to take this additional instructions        Allergies  Allergen Reactions   Metformin  And Related Diarrhea and Other  (See Comments)    Must take XR form only, cannot tolerate Regular release metformin    Cleocin  [Clindamycin  Hcl] Diarrhea   Codeine Itching   Macrolides And Ketolides Diarrhea   Morphine Hives   Pentazocine Lactate Itching and Nausea And Vomiting    (GENERIC- Talwin)   Vibramycin [Doxycycline Calcium ] Diarrhea and Rash   Definity  [Perflutren  Lipid Microsphere] Other (See Comments)    Patient complained of warm sensation in chest x few seconds duration when injected Definity .No other symptoms   Doxycycline Diarrhea   Pentazocine Nausea Only   Sulfa Antibiotics Diarrhea and Rash    Consultations: None Procedures/Studies: CT ABDOMEN PELVIS W CONTRAST Result Date: 03/14/2023 CLINICAL DATA:  Abdominal pain. Severe constipation. Concern for stercoral colitis EXAM: CT ABDOMEN AND PELVIS WITH CONTRAST TECHNIQUE: Multidetector CT imaging of the abdomen and pelvis was performed using the standard protocol following bolus administration of intravenous contrast. RADIATION DOSE REDUCTION: This exam was performed according to the departmental dose-optimization program which includes automated exposure control, adjustment of the mA and/or kV according to patient size and/or use of iterative reconstruction technique. CONTRAST:  OMNIPAQUE  IOHEXOL  300 MG/ML  SOLN COMPARISON:  07/22/2021 FINDINGS: Lower chest: No acute abnormality Hepatobiliary: No focal liver abnormality is seen. Status  post cholecystectomy. No biliary dilatation. Pancreas: No focal abnormality or ductal dilatation. Spleen: No focal abnormality.  Normal size. Adrenals/Urinary Tract: 1.4 cm cyst in the midpole of the left kidney. No follow-up imaging recommended. No stones or hydronephrosis. Adrenal glands and urinary bladder unremarkable. Stomach/Bowel: Large stool burden in the rectum suggesting fecal impaction. No evidence of stercoral colitis. Large stool burden throughout the remainder of the colon suggesting constipation. Stomach and  small bowel decompressed, unremarkable. Vascular/Lymphatic: Aortic atherosclerosis. No evidence of aneurysm or adenopathy. Reproductive: Prior hysterectomy. No adnexal masses. Structure in the vagina, presumably pessary. Other: No free fluid or free air. Musculoskeletal: No acute bony abnormality. IMPRESSION: Large stool burden in the rectum and throughout the colon suggesting fecal impaction and constipation. No evidence of stercoral colitis. Aortic atherosclerosis. Electronically Signed   By: Franky Crease M.D.   On: 03/14/2023 01:15   DG Chest 2 View Result Date: 03/13/2023 CLINICAL DATA:  Query sepsis, constipation EXAM: CHEST - 2 VIEW COMPARISON:  07/19/2022 FINDINGS: Stable cardiomediastinal silhouette. Sternotomy and CABG. Left basilar atelectasis or scarring. Otherwise no focal consolidation, pleural effusion, or pneumothorax. No displaced rib fractures. Elevated right hemidiaphragm containing gaseous distention of the colon at the hepatic flexure. IMPRESSION: No active cardiopulmonary disease. Electronically Signed   By: Norman Gatlin M.D.   On: 03/13/2023 23:54     Subjective: No acute issues or events overnight multiple bowel movements noted, denies nausea vomiting diarrhea headache fevers chills or chest pain   Discharge Exam: Vitals:   03/15/23 1103 03/15/23 1200  BP: 117/74 129/74  Pulse: 98   Resp: 12 17  Temp: 98.4 F (36.9 C)   SpO2: 90%    Vitals:   03/15/23 0723 03/15/23 0800 03/15/23 1103 03/15/23 1200  BP: 130/76 125/72 117/74 129/74  Pulse: 96 97 98   Resp: (!) 22 19 12 17   Temp: 98.8 F (37.1 C)  98.4 F (36.9 C)   TempSrc:   Oral   SpO2: 96% 95% 90%   Weight:      Height:        General: Pt is alert, awake, not in acute distress Cardiovascular: RRR, S1/S2 +, no rubs, no gallops Respiratory: CTA bilaterally, no wheezing, no rhonchi Abdominal: Soft, NT, ND, bowel sounds + Extremities: no edema, no cyanosis    The results of significant diagnostics from  this hospitalization (including imaging, microbiology, ancillary and laboratory) are listed below for reference.     Microbiology: Recent Results (from the past 240 hours)  Culture, blood (routine x 2)     Status: None (Preliminary result)   Collection Time: 03/14/23  1:50 AM   Specimen: BLOOD RIGHT HAND  Result Value Ref Range Status   Specimen Description   Final    BLOOD RIGHT HAND Performed at [facility], [address], [place], [place] [address]    Special Requests   Final    BOTTLES DRAWN AEROBIC AND ANAEROBIC Blood Culture adequate volume Performed at [facility], [address], [place], [place] [address]    Culture   Final    NO GROWTH 1 DAY Performed at [facility], [address], [place], [place] [address]    Report Status PENDING  Incomplete  Culture, blood (routine x 2)     Status: None (Preliminary result)   Collection Time: 03/14/23  2:14 AM   Specimen: BLOOD LEFT HAND  Result Value Ref Range Status   Specimen Description   Final  BLOOD LEFT HAND Performed at [facility], [address], [place], [place] [address]    Special Requests   Final    BOTTLES DRAWN AEROBIC AND ANAEROBIC Blood Culture results may not be optimal due to an inadequate volume of blood received in culture bottles Performed at [facility], [address] [place], [place] [address]    Culture   Final    NO GROWTH 1 DAY Performed at [facility], [address], [place], [place] [address]    Report Status PENDING  Incomplete  Urine Culture (for pregnant, neutropenic or urologic patients or patients with an indwelling urinary catheter)     Status: None (Preliminary result)   Collection Time: 03/14/23  4:55 PM   Specimen: Urine, Clean Catch  Result Value Ref Range Status   Specimen Description   Final    URINE, CLEAN CATCH Performed at [facility], [address] [place], [place] [address]     Special Requests   Final    NONE Performed at [facility], [address] [place], [place] [address]    Culture   Final    CULTURE REINCUBATED FOR BETTER GROWTH Performed at [facility], [address], [place], [place] [address]    Report Status PENDING  Incomplete     Labs: BNP (last 3 results) Recent Labs    07/19/22 1054 03/14/23 1502  BNP 26.9 73.6   Basic Metabolic Panel: Recent Labs  Lab 03/13/23 2011 03/14/23 0508 03/15/23 0546  NA 131* 135 134*  K 3.5 3.8 3.2*  CL 97* 101 102  CO2 17* 21* 22  GLUCOSE 219* 169* 99  BUN 19 19 14   CREATININE 0.97 0.91 0.59  CALCIUM  9.6 8.8* 8.4*   Liver Function Tests: Recent Labs  Lab 03/13/23 2011  AST 24  ALT 15  ALKPHOS 66  BILITOT 1.2  PROT 8.6*  ALBUMIN 4.4   Recent Labs  Lab 03/13/23 2011  LIPASE 22   No results for input(s): AMMONIA in the last 168 hours. CBC: Recent Labs  Lab 03/13/23 2011 03/14/23 1449 03/15/23 0546  WBC 15.4* 12.3* 6.8  NEUTROABS  --  9.1*  --   HGB 14.8 12.5 11.4*  HCT 46.8* 38.6 34.8*  MCV 90.2 88.9 89.0  PLT 321 287 227   Cardiac Enzymes: No results for input(s): CKTOTAL, CKMB, CKMBINDEX, TROPONINI in the last 168 hours. BNP: Invalid input(s): POCBNP CBG: Recent Labs  Lab 03/14/23 2013 03/15/23 0007 03/15/23 0357 03/15/23 0739 03/15/23 1200  GLUCAP 143* 116* 107* 96 102*   D-Dimer No results for input(s): DDIMER in the last 72 hours. Hgb A1c No results for input(s): HGBA1C in the last 72 hours. Lipid Profile No results for input(s): CHOL, HDL, LDLCALC, TRIG, CHOLHDL, LDLDIRECT in the last 72 hours. Thyroid  function studies No results for input(s): TSH, T4TOTAL, T3FREE, THYROIDAB in the last 72 hours.  Invalid input(s): FREET3 Anemia work up No results for input(s): VITAMINB12, FOLATE, FERRITIN, TIBC, IRON, RETICCTPCT in the last 72 hours. Urinalysis    Component Value Date/Time    COLORURINE STRAW (A) 03/14/2023 1655   APPEARANCEUR CLEAR 03/14/2023 1655   LABSPEC 1.015 03/14/2023 1655   PHURINE 5.0 03/14/2023 1655   GLUCOSEU >=500 (A) 03/14/2023 1655   GLUCOSEU >=1000 (A) 10/20/2022 1557   HGBUR NEGATIVE 03/14/2023 1655   HGBUR negative 04/23/2008 0959   BILIRUBINUR NEGATIVE 03/14/2023 1655   BILIRUBINUR negative 01/12/2023 1404  KETONESUR NEGATIVE 03/14/2023 1655   PROTEINUR NEGATIVE 03/14/2023 1655   UROBILINOGEN 0.2 01/12/2023 1404   UROBILINOGEN 0.2 10/20/2022 1557   NITRITE NEGATIVE 03/14/2023 1655   LEUKOCYTESUR NEGATIVE 03/14/2023 1655   Sepsis Labs Recent Labs  Lab 03/13/23 2011 03/14/23 1449 03/15/23 0546  WBC 15.4* 12.3* 6.8   Microbiology Recent Results (from the past 240 hours)  Culture, blood (routine x 2)     Status: None (Preliminary result)   Collection Time: 03/14/23  1:50 AM   Specimen: BLOOD RIGHT HAND  Result Value Ref Range Status   Specimen Description   Final    BLOOD RIGHT HAND Performed at [facility], [address], [place], [place] [address]    Special Requests   Final    BOTTLES DRAWN AEROBIC AND ANAEROBIC Blood Culture adequate volume Performed at [facility], [address], [place], [place] [address]    Culture   Final    NO GROWTH 1 DAY Performed at [facility], [address], [place], [place] [address]    Report Status PENDING  Incomplete  Culture, blood (routine x 2)     Status: None (Preliminary result)   Collection Time: 03/14/23  2:14 AM   Specimen: BLOOD LEFT HAND  Result Value Ref Range Status   Specimen Description   Final    BLOOD LEFT HAND Performed at [facility], [address], [place], [place] [address]    Special Requests   Final    BOTTLES DRAWN AEROBIC AND ANAEROBIC Blood Culture results may not be optimal due to an inadequate volume of blood received in culture bottles Performed at [facility], [address],  [place], [place] [address]    Culture   Final    NO GROWTH 1 DAY Performed at [facility], [address] [place], [place] [address]    Report Status PENDING  Incomplete  Urine Culture (for pregnant, neutropenic or urologic patients or patients with an indwelling urinary catheter)     Status: None (Preliminary result)   Collection Time: 03/14/23  4:55 PM   Specimen: Urine, Clean Catch  Result Value Ref Range Status   Specimen Description   Final    URINE, CLEAN CATCH Performed at [facility], [address] [place], [place] [address]    Special Requests   Final    NONE Performed at [facility], [address], [place], [place] [address]    Culture   Final    CULTURE REINCUBATED FOR BETTER GROWTH Performed at [facility], [address], [place], [place] [address]    Report Status PENDING  Incomplete     Time coordinating discharge: Over 30 minutes  SIGNED:   Elsie JAYSON Montclair, DO [facility] 03/15/2023, 3:31 PM Pager   If 7PM-7AM, please contact night-coverage [email or web address]

## 2023-03-16 ENCOUNTER — Ambulatory Visit: Payer: Self-pay | Admitting: Internal Medicine

## 2023-03-16 ENCOUNTER — Ambulatory Visit (INDEPENDENT_AMBULATORY_CARE_PROVIDER_SITE_OTHER): Payer: Medicare Other | Admitting: Family Medicine

## 2023-03-16 ENCOUNTER — Telehealth: Payer: Self-pay

## 2023-03-16 ENCOUNTER — Ambulatory Visit: Payer: Medicare Other | Admitting: Obstetrics and Gynecology

## 2023-03-16 ENCOUNTER — Encounter (HOSPITAL_BASED_OUTPATIENT_CLINIC_OR_DEPARTMENT_OTHER): Payer: Self-pay | Admitting: Family Medicine

## 2023-03-16 VITALS — BP 107/71 | HR 83 | Ht 60.0 in | Wt 172.4 lb

## 2023-03-16 DIAGNOSIS — R197 Diarrhea, unspecified: Secondary | ICD-10-CM | POA: Diagnosis not present

## 2023-03-16 LAB — HEMOGLOBIN A1C
Hgb A1c MFr Bld: 9.9 % — ABNORMAL HIGH (ref 4.8–5.6)
Mean Plasma Glucose: 237 mg/dL

## 2023-03-16 LAB — URINE CULTURE

## 2023-03-16 NOTE — Progress Notes (Signed)
   Acute Office Visit  Subjective:     Patient ID: Kristen [name], female    DOB: 07/15/1945, 78 y.o.   MRN: [id number]  Chief Complaint  Patient presents with   Diarrhea    Started yesterday about 2 hours after hospital discharge. Did not feel the urge to poop, was unable to control bowels    Kristen [name] is a 78 year old female patient who presents today for an acute visit with complaint of diarrhea. Symptoms have been present since yesterday. She was admitted to the hospital from 1/6 to 1/8 for sepsis of unknown cause and diarrhea.  She had one good one while in the hospital LBM 1/8 Her normal is about 2-3 days  When she had these diarrhea episodes at home, she had no warning.  Associated symptoms include:  Pertinent negatives: fever/body aches, abdominal pain, N/V Treatments tried include: MiraLax , sennoside-docusate Treatment effective: yes  Yesterday she did drink 2 Ensures   ROS: see HPI    Objective:    BP 107/71   Pulse 83   Ht 5' (1.524 m)   Wt 172 lb 6.4 oz (78.2 kg)   SpO2 99%   BMI 33.67 kg/m   Physical Exam Vitals reviewed.  Constitutional:      Appearance: Normal appearance.  Cardiovascular:     Rate and Rhythm: Normal rate and regular rhythm.     Pulses: Normal pulses.     Heart sounds: Normal heart sounds.  Pulmonary:     Effort: Pulmonary effort is normal.     Breath sounds: Normal breath sounds.  Neurological:     Mental Status: She is alert.  Psychiatric:        Mood and Affect: Mood normal.        Behavior: Behavior normal.    Assessment & Plan:   1. Diarrhea, unspecified type (Primary) Kristen [name] is a 78 year old female patient who presents today for an acute visit with complaint of diarrhea. She was recently hospitalized for sepsis of unknown origin. Review of chart shows abnormal UA and appears that patient receive IV abx in the hospital. Vital signs reviewed. Normotensive with normal HR. Patient presents today  with slightly elevated blood pressure. Patient in no acute distress and is well-appearing. Cardiovascular exam with heart regular rate and rhythm. Normal heart sounds, no murmurs present. Lungs clear to auscultation bilaterally. Auscultation of abdomen with normal bowel sounds. No tenderness to palpation of abdomen. Discussed diarrhea likely due to stool softeners and laxatives given in hospital. Discussed medication regimen to prevent constipation and avoid diarrhea. Advised her to schedule hospital follow-up with PCP.    Evalene Arts, FNP

## 2023-03-16 NOTE — Transitions of Care (Post Inpatient/ED Visit) (Signed)
   03/16/2023  Name: Kristen [name] MRN: [id number] DOB: [birth year]  Today's TOC FU Call Status: Today's TOC FU Call Status:: Unsuccessful Call (1st Attempt) Unsuccessful Call (1st Attempt) Date: 03/16/23  Attempted to reach the patient regarding the most recent Inpatient/ED visit.  Follow Up Plan: Additional outreach attempts will be made to reach the patient to complete the Transitions of Care (Post Inpatient/ED visit) call.   Alan Ee, RN, BSN, CEN [employer]- Transition of Care Team.  [facility] [phone]

## 2023-03-16 NOTE — Patient Instructions (Addendum)
 Continue to take Metamucil  Recommend taking MiraLax after having a decent bowel movement  Schedule hospital follow-up visit

## 2023-03-16 NOTE — Telephone Encounter (Signed)
 Copied from CRM [id number]. Topic: Clinical - Pink Word Triage >> Mar 16, 2023 10:55 AM Burnard DEL wrote: Reason for Triage: patient was discharged from hospital after severe constipation,since then she has been having horrible diarrhea that she's not able to control.She was told to take miralax  when discharged from the hospital ,however she is wondering if she should stop taking it because of the diarrhea that she is having.  Chief Complaint: Diarrhea Symptoms: Diarrhea Frequency: A day Pertinent Negatives: Patient denies vomiting and abdominal pain Disposition: [] ED /[] Urgent Care (no appt availability in office) / [x] Appointment(In office/virtual)/ []  [facility]/ [] Home Care/ [] Refused Recommended Disposition /[]  Mobile Bus/ []  Follow-up with PCP Additional Notes: Patient called in to report a new onset of diarrhea since returning from the hospital. Patient was seen in the hospital on 1/7 for a bowel obstruction. Patient was discharged yesterday morning and started experiencing moderate diarrhea at 3am. Patient reported that diarrhea is completely watery and is uncontrolled. Patient reported that she tried to use incontinence pads to contain her bowel movements, but the bowel movements are large and leak onto the floor. Patient denied vomiting and abdominal pain. Patient reported that she has been able to drink fluids, but is having a hard time eating solids. This RN advised that patient be seen in office today. No availability at current PCP office. This RN scheduled her an appointment today at [facility]. This RN advised patient to call back if symptoms worsen before then. This RN advised patient to continue fluid intake and to try to introduce bland foods. Patient complied.   Reason for Disposition  [1] MODERATE diarrhea (e.g., 4-6 times / day more than normal) AND [2] age > 70 years  Answer Assessment - Initial Assessment Questions 1. DIARRHEA SEVERITY: How bad is the  diarrhea? How many more stools have you had in the past 24 hours than normal?    - NO DIARRHEA (SCALE 0)   - MILD (SCALE 1-3): Few loose or mushy BMs; increase of 1-3 stools over normal daily number of stools; mild increase in ostomy output.   -  MODERATE (SCALE 4-7): Increase of 4-6 stools daily over normal; moderate increase in ostomy output.   -  SEVERE (SCALE 8-10; OR WORST POSSIBLE): Increase of 7 or more stools daily over normal; moderate increase in ostomy output; incontinence.     Patient reports having diarrhea 3 times since 3am  2. ONSET: When did the diarrhea begin?      3am  3. BM CONSISTENCY: How loose or watery is the diarrhea?      Patient states her bowel movements started with small stools, but are completely watery at this time, patient also states that bowel movements are large in amount and have leaked onto the floor  4. VOMITING: Are you also vomiting? If Yes, ask: How many times in the past 24 hours?      Denies  5. ABDOMEN PAIN: Are you having any abdomen pain? If Yes, ask: What does it feel like? (e.g., crampy, dull, intermittent, constant)      Denies  6. ABDOMEN PAIN SEVERITY: If present, ask: How bad is the pain?  (e.g., Scale 1-10; mild, moderate, or severe)   - MILD (1-3): doesn't interfere with normal activities, abdomen soft and not tender to touch    - MODERATE (4-7): interferes with normal activities or awakens from sleep, abdomen tender to touch    - SEVERE (8-10): excruciating pain, doubled over, unable to do any  normal activities       Denies  7. ORAL INTAKE: If vomiting, Have you been able to drink liquids? How much liquids have you had in the past 24 hours?     Patient states she is able to drink apple juice, ginger ale, and Ensure  8. HYDRATION: Any signs of dehydration? (e.g., dry mouth [not just dry lips], too weak to stand, dizziness, new weight loss) When did you last urinate?     Denies dizziness and weakness    11.  OTHER SYMPTOMS: Do you have any other symptoms? (e.g., fever, blood in stool)       Patient reports vaginal and rectal pain yesterday  Protocols used: [facility]

## 2023-03-17 ENCOUNTER — Telehealth: Payer: Self-pay | Admitting: *Deleted

## 2023-03-17 ENCOUNTER — Ambulatory Visit: Payer: Self-pay | Admitting: Internal Medicine

## 2023-03-17 NOTE — Transitions of Care (Post Inpatient/ED Visit) (Signed)
 03/17/2023  Name: Kristen [name] MRN: [id number] DOB: 06/11/1945  Today's TOC FU Call Status: Today's TOC FU Call Status:: Successful TOC FU Call Completed TOC FU Call Complete Date: 03/17/23 Patient's Name and Date of Birth confirmed.  Transition Care Management Follow-up Telephone Call Date of Discharge: 03/15/23 Discharge Facility: [facility]) Type of Discharge: Inpatient Admission Primary Inpatient Discharge Diagnosis:: ED boarder- admission for abdominal pain/ constipation How have you been since you were released from the hospital?: Better (I am doing better; I am going to the bathroom regularly now.  I am going to see Dr. Joshua on Monday, things are better now than they were when I first came home) Any questions or concerns?: No  Items Reviewed: Did you receive and understand the discharge instructions provided?: Yes (thoroughly reviewed with patient who verbalizes good understanding of same) Medications obtained,verified, and reconciled?: Yes (Medications Reviewed) (Full medication reconciliation/ review completed; no concerns or discrepancies identified; confirmed patient obtained/ is taking all newly Rx'd medications as instructed; self-manages medications and denies questions/ concerns around medications today) Any new allergies since your discharge?: No Dietary orders reviewed?: Yes Type of Diet Ordered:: Heart Healthy Low salt Do you have support at home?: Yes People in Home: alone Name of Support/Comfort Primary Source: Reports independent in self-care activities; supportive son assists as/ if needed/ indicated; resides in [facility] and has assistance there- all ammenties included per patient report  Medications Reviewed Today: Medications Reviewed Today     Reviewed by Rejoice Heatwole M, RN (Registered Nurse) on 03/17/23 at 1459  Med List Status: <None>   Medication Order Taking? Sig Documenting Provider Last Dose Status Informant  ALPRAZolam  (XANAX ) 1 MG  tablet [id number] Yes Take 1 tablet (1 mg total) by mouth 3 (three) times daily as needed.  Patient taking differently: Take 1 mg by mouth 3 (three) times daily.   Joshua Debby CROME, MD Taking Active Self  atorvastatin  (LIPITOR) 40 MG tablet [id number] Yes Take 1 tablet (40 mg total) by mouth daily.  Patient taking differently: Take 40 mg by mouth at bedtime.   Joshua Debby CROME, MD Taking Active Self  COMFORT EZ PEN NEEDLES 31G X 8 MM MISC [id number] No USE WITH insulin  AS DIRECTED  Patient not taking: Reported on 03/17/2023   Joshua Debby CROME, MD Not Taking Active            Med Note (Ameir Faria M   Fri Mar 17, 2023  2:41 PM) 03/17/23: reports during [facility] call- does not have/ use/ not taking insulin   Cyanocobalamin  (VITAMIN DEFICIENCY SYSTEM-B12) 1000 MCG/ML KIT [id number] Yes Inject 1,000 mcg as directed every 30 (thirty) days. [provider] Taking Active Self  cyclobenzaprine  (FLEXERIL ) 10 MG tablet [id number] Yes Take 10 mg by mouth 3 (three) times daily as needed for muscle spasms. [provider] Taking Active Self  diclofenac  Sodium (VOLTAREN ) 1 % GEL [id number] Yes Apply 2 g topically 4 (four) times daily.  Patient taking differently: Apply 2 g topically 4 (four) times daily as needed (for pain).   Sherrill Cable [place], [place] Taking Active Self  estradiol  (ESTRACE ) 0.1 MG/GM vaginal cream [id number] Yes Place 0.5 g vaginally 2 (two) times a week. Place 0.5g nightly for two weeks then twice a week after  Patient taking differently: Place 0.5 g vaginally 2 (two) times a week.   Zuleta, Kaitlin G, NP Taking Active Self  furosemide  (LASIX ) 20 MG tablet [id number] Yes Take 1 tablet (20 mg total) by mouth daily. Joshua Debby  L, MD Taking Active Self  gabapentin  (NEURONTIN ) 300 MG capsule [id number] Yes TAKE ONE CAPSULE BY MOUTH THREE TIMES DAILY  Patient taking differently: Take 300 mg by mouth See admin instructions. Take 300 mg by mouth two to three times a day   Joshua Debby CROME, MD  Taking Active Self  JARDIANCE  25 MG TABS tablet [id number] Yes Take 1 tablet (25 mg total) by mouth daily. Joshua Debby CROME, MD Taking Active Self  magnesium  oxide (MAG-OX) 400 MG tablet [id number] Yes Take 1 tablet (400 mg total) by mouth 2 (two) times daily. Joshua Debby CROME, MD Taking Active Self  metFORMIN  (GLUCOPHAGE -XR) 500 MG 24 hr tablet [id number] Yes TAKE (3) TABLETS BY MOUTH EVERY MORNING WITH BREAKFAST  Patient taking differently: Take 1,500 mg by mouth daily with breakfast.   Joshua Debby CROME, MD Taking Active Self  metoprolol  succinate (TOPROL -XL) 100 MG 24 hr tablet [id number] Yes Take 1 tablet (100 mg total) by mouth in the morning and at bedtime. Joshua Debby CROME, MD Taking Active Self           Med Note MARISA, NATHANEL SAILOR   Tue Mar 14, 2023  6:36 PM) Last received HERE- at [facility]  nitroGLYCERIN  (NITROSTAT ) 0.4 MG SL tablet [id number] Yes Place 1 tablet (0.4 mg total) under the tongue every 5 (five) minutes as needed for chest pain (x 3 doses). Reported on 05/08/2015 Wonda Sharper, MD Taking Active Self           Med Note CARMIN HANDING   Wed May 25, 2022  2:57 PM)    oxyCODONE -acetaminophen  (PERCOCET) 10-325 MG tablet [id number] Yes Take 1 tablet by mouth 5 (five) times daily as needed for pain.  Patient taking differently: Take 1 tablet by mouth See admin instructions. Take 1 tablet by mouth four to five times a day    Taking Active Self  potassium chloride  SA (KLOR-CON  M) 20 MEQ tablet [id number] Yes Take 1 tablet (20 mEq total) by mouth in the morning and at bedtime. Joshua Debby CROME, MD Taking Active Self  Psyllium (METAMUCIL PO) [id number] Yes Take 1 capsule by mouth daily. [provider] Taking Active Self           Med Note MARISA, NATHANEL SAILOR   Tue Mar 14, 2023  6:15 PM) The patient states she often forgets to take this  sacubitril -valsartan  (ENTRESTO ) 97-103 MG [id number] Yes Take 1 tablet by mouth 2 (two) times daily. Wonda Sharper, MD Taking Active Self           Med Note MARISA,  NATHANEL SAILOR   Tue Mar 14, 2023  6:51 PM) I called Cover My Meds. They said this WAS last filled on 01/26/2023 for a 90-day's supply (and mailed).  Semaglutide  (RYBELSUS ) 7 MG TABS [id number] Yes Take 1 tablet (7 mg total) by mouth daily. Joshua Debby CROME, MD Taking Active Self  spironolactone  (ALDACTONE ) 25 MG tablet [id number] Yes TAKE 1 TABLET BY MOUTH ONCE DAILY *REFILL REQUEST*  Patient taking differently: Take 25 mg by mouth daily.   Joshua Debby CROME, MD Taking Active Self  warfarin (COUMADIN ) 3 MG tablet [id number] Yes TAKE ONE TABLET BY MOUTH DAILY EXCEPT TAKE 1 1/2 TABLETS ON WEDNESDAYS OR AS DIRECTED BY ANTICOAGULATION CLINIC.  Patient taking differently: Take 3-4.5 mg by mouth See admin instructions. Take 3 mg by mouth at bedtime on Sun/Mon/Tues/Thurs/Fri/Sat and 4.5 mg on Wednesdays   Joshua Debby CROME, MD Taking Active Self  Med Note MARISA, KATHY N   Tue Mar 14, 2023  6:36 PM) Last received HERE- at [facility] and Equipment/Supplies: Were Home Health Services Ordered?: No Any new equipment or medical supplies ordered?: No  Functional Questionnaire: Do you need assistance with bathing/showering or dressing?: No Do you need assistance with meal preparation?: No Do you need assistance with eating?: No Do you have difficulty maintaining continence: No Do you need assistance with getting out of bed/getting out of a chair/moving?: No Do you have difficulty managing or taking your medications?: No  Follow up appointments reviewed: PCP Follow-up appointment confirmed?: Yes Date of PCP follow-up appointment?: 03/17/23 Follow-up Provider: PCP Specialist Hospital Follow-up appointment confirmed?: NA Do you need transportation to your follow-up appointment?: No Do you understand care options if your condition(s) worsen?: Yes-patient verbalized understanding  SDOH Interventions Today    Flowsheet Row Most Recent Value  SDOH Interventions   Food Insecurity  Interventions Intervention Not Indicated  Housing Interventions Intervention Not Indicated  Transportation Interventions Intervention Not Indicated  [reports family or friends provide transportation]  Utilities Interventions Intervention Not Indicated      Interventions Today    Flowsheet Row Most Recent Value  Chronic Disease   Chronic disease during today's visit Other  [constipation]  General Interventions   General Interventions Discussed/Reviewed General Interventions Discussed, Durable Medical Equipment (DME), Doctor Visits  Doctor Visits Discussed/Reviewed Doctor Visits Discussed, PCP  Durable Medical Equipment (DME) Vannie lean uses assistive devices on regular basis, at baseline -- cahne/ walker]  PCP/Specialist Visits Compliance with follow-up visit  Education Interventions   Education Provided Provided Education  Provided Verbal Education On Other  [management of chronic constipation in setting of chronic opioid therapy]  Nutrition Interventions   Nutrition Discussed/Reviewed Nutrition Discussed  Pharmacy Interventions   Pharmacy Dicussed/Reviewed Pharmacy Topics Discussed  [Full medication review with updating medication list in EHR per patient report]  Safety Interventions   Safety Discussed/Reviewed Safety Discussed, Fall Risk      TOC Interventions Today    Flowsheet Row Most Recent Value  TOC Interventions   TOC Interventions Discussed/Reviewed TOC Interventions Discussed      Patient declines need for ongoing/ further care management/ coordination outreach; declines enrollment in 30-day TOC program   Beatris Blinda Lawrence, RN, BSN, Media Planner  Transitions of Care  [facility] Population [facility]  [place] [phone]: direct office

## 2023-03-17 NOTE — Telephone Encounter (Signed)
 Copied from CRM [id number]. Topic: Clinical - Red Word Triage >> Mar 17, 2023  9:15 AM Leila BROCKS wrote: Red Word that prompted transfer to Nurse Triage: Patient [phone] states discharge from [facility] 03/13/23, saw Dr. Evalene Arts 03/16/23 and was advised to have hospital follow up with Dr. Debby Molt the next available is 04/25/23. Patient was dx with unspecified sepsis in the hospital. Patient c/o dropping things, weak, bowel issues not diarrhea, veritgo, losing words, small appetite, doesn't feel like she'll make it to next week to wait to address her health issues. Scheduled an appointment with Dr. Geofm 03/21/23.  Chief Complaint: Weakness Symptoms: Weakness, no appetite, feels cold Frequency: Ongoing Pertinent Negatives: Patient denies fever, pain, and diarrhea Disposition: [] ED /[] Urgent Care (no appt availability in office) / [] Appointment(In office/virtual)/ []  [facility]/ [x] Home Care/ [] Refused Recommended Disposition /[]  Mobile Bus/ []  Follow-up with PCP Additional Notes: Patient called in to report that she feels generally weak/fatigued. Patient was hospitalized on Monday for fecal impaction and released on Wednesday. Discharge diagnosis was unspecified sepsis. Patient denied fever and pain at this time. Patient was seen yesterday in office for diarrhea. Patient also denied diarrhea at this time. Patient stated she feels weak and has been dropping things. Patient also stated that she does not have an appetite. This RN attempted to redirect patient to questions at hand, but patient unable to stay on topic. Upon further assessment, patient reports she is feeling better since her hospital stay and is wanting to see her PCP sooner. Patient was originally scheduled for a HFU on 1/14.This RN rescheduled patient for HFU on 1/13 with PCP. This RN advised home care until [facility] appointment. Patient complied.   Reason for Disposition  [1] Condition / symptoms BETTER  (improving) AND [2] caller has additional questions triager can answer  Answer Assessment - Initial Assessment Questions 1. MAIN CONCERN OR SYMPTOM:  What is your main concern right now? What question do you have? What's the main symptom you're worried about? (e.g., breathing difficulty, ankle swelling, weight gain.)     Patient is concerned about general weakness  2. ONSET: When did the impaction start?     Saturday  3. BETTER-SAME-WORSE: Are you getting better, staying the same, or getting worse compared to the day you were discharged?     Patient states she feels better being at home than at hospital  4. HOSPITALIZATION: How long were you hospitalized? (e.g., days)     Monday-Wednesday  5. DISCHARGE DIAGNOSIS:  What problem or disease were you hospitalized for?     Unspecified sepsis  6. DISCHARGE DATE: What date were you discharged from the hospital?     Wednesday  8. DISCHARGE APPOINTMENT: Have you scheduled a follow-up discharge appointment with your doctor?     Yes, already scheduled for 1/14  9. DISCHARGE MEDICINES: Did the doctor (or NP/PA) who discharged you order any new medicines for you to use? If yes, have you filled the prescription and started taking the medicine?      Patient denies  10. PAIN: Is there any pain? If Yes, ask: How bad is it?  (Scale 0-10; or mild, moderate, severe)   - NONE (0): no pain   - MILD (1-3): doesn't interfere with normal activities   - MODERATE (4-7): interferes with normal activities (e.g., work or school) or awakens from sleep   - SEVERE (8-10): excruciating pain, unable to do any normal activities       Patient denies  pain  11. FEVER: Do you have a fever? If Yes, ask: What is it, how was it measured  and when did it start?       Patient denies fever  12. OTHER SYMPTOMS: Do you have any other symptoms?       Patient states she feels cold, weakness/dropping things, no appetite  Protocols used:  Post-Hospitalization Follow-up Call-A-[facility]

## 2023-03-19 LAB — CULTURE, BLOOD (ROUTINE X 2)
Culture: NO GROWTH
Culture: NO GROWTH
Special Requests: ADEQUATE

## 2023-03-20 ENCOUNTER — Ambulatory Visit (INDEPENDENT_AMBULATORY_CARE_PROVIDER_SITE_OTHER): Payer: Medicare Other | Admitting: Internal Medicine

## 2023-03-20 ENCOUNTER — Encounter: Payer: Self-pay | Admitting: Internal Medicine

## 2023-03-20 VITALS — BP 116/82 | HR 85 | Temp 97.5°F | Resp 16 | Ht 60.0 in | Wt 167.0 lb

## 2023-03-20 DIAGNOSIS — K5904 Chronic idiopathic constipation: Secondary | ICD-10-CM

## 2023-03-20 DIAGNOSIS — N1831 Chronic kidney disease, stage 3a: Secondary | ICD-10-CM

## 2023-03-20 DIAGNOSIS — Z0001 Encounter for general adult medical examination with abnormal findings: Secondary | ICD-10-CM | POA: Diagnosis not present

## 2023-03-20 DIAGNOSIS — E039 Hypothyroidism, unspecified: Secondary | ICD-10-CM

## 2023-03-20 DIAGNOSIS — Z7901 Long term (current) use of anticoagulants: Secondary | ICD-10-CM

## 2023-03-20 DIAGNOSIS — E1151 Type 2 diabetes mellitus with diabetic peripheral angiopathy without gangrene: Secondary | ICD-10-CM

## 2023-03-20 DIAGNOSIS — K5903 Drug induced constipation: Secondary | ICD-10-CM | POA: Diagnosis not present

## 2023-03-20 DIAGNOSIS — E1169 Type 2 diabetes mellitus with other specified complication: Secondary | ICD-10-CM

## 2023-03-20 DIAGNOSIS — D539 Nutritional anemia, unspecified: Secondary | ICD-10-CM | POA: Diagnosis not present

## 2023-03-20 DIAGNOSIS — E785 Hyperlipidemia, unspecified: Secondary | ICD-10-CM | POA: Diagnosis not present

## 2023-03-20 DIAGNOSIS — Z794 Long term (current) use of insulin: Secondary | ICD-10-CM | POA: Diagnosis not present

## 2023-03-20 DIAGNOSIS — M797 Fibromyalgia: Secondary | ICD-10-CM | POA: Diagnosis not present

## 2023-03-20 DIAGNOSIS — E119 Type 2 diabetes mellitus without complications: Secondary | ICD-10-CM

## 2023-03-20 LAB — LIPID PANEL
Cholesterol: 113 mg/dL (ref 0–200)
HDL: 40 mg/dL (ref >39.00)
LDL Cholesterol: 51 mg/dL (ref 0–99)
NonHDL: 73.18
Total CHOL/HDL Ratio: 3
Triglycerides: 113 mg/dL (ref 0.0–149.0)
VLDL: 22.6 mg/dL (ref 0.0–40.0)

## 2023-03-20 LAB — CBC WITH DIFFERENTIAL/PLATELET
Basophils Absolute: 0 10*3/uL (ref 0.0–0.1)
Basophils Relative: 1 % (ref 0.0–3.0)
Eosinophils Absolute: 0.1 10*3/uL (ref 0.0–0.7)
Eosinophils Relative: 1.2 % (ref 0.0–5.0)
HCT: 44.2 % (ref 36.0–46.0)
Hemoglobin: 14.5 g/dL (ref 12.0–15.0)
Lymphocytes Relative: 49.5 % — ABNORMAL HIGH (ref 12.0–46.0)
Lymphs Abs: 2.4 10*3/uL (ref 0.7–4.0)
MCHC: 32.8 g/dL (ref 30.0–36.0)
MCV: 89.4 fL (ref 78.0–100.0)
Monocytes Absolute: 0.5 10*3/uL (ref 0.1–1.0)
Monocytes Relative: 9.9 % (ref 3.0–12.0)
Neutro Abs: 1.9 10*3/uL (ref 1.4–7.7)
Neutrophils Relative %: 38.4 % — ABNORMAL LOW (ref 43.0–77.0)
Platelets: 343 10*3/uL (ref 150.0–400.0)
RBC: 4.94 Mil/uL (ref 3.87–5.11)
RDW: 18.6 % — ABNORMAL HIGH (ref 11.5–15.5)
WBC: 4.9 10*3/uL (ref 4.0–10.5)

## 2023-03-20 LAB — PROTIME-INR
INR: 1.9 {ratio} — ABNORMAL HIGH (ref 0.8–1.0)
Prothrombin Time: 19.6 s — ABNORMAL HIGH (ref 9.6–13.1)

## 2023-03-20 LAB — VITAMIN B12: Vitamin B-12: 584 pg/mL (ref 211–911)

## 2023-03-20 LAB — IBC + FERRITIN
Ferritin: 22.3 ng/mL (ref 10.0–291.0)
Iron: 43 ug/dL (ref 42–145)
Saturation Ratios: 10.8 % — ABNORMAL LOW (ref 20.0–50.0)
TIBC: 399 ug/dL (ref 250.0–450.0)
Transferrin: 285 mg/dL (ref 212.0–360.0)

## 2023-03-20 LAB — TSH: TSH: 2.99 u[IU]/mL (ref 0.35–5.50)

## 2023-03-20 LAB — MAGNESIUM: Magnesium: 2.1 mg/dL (ref 1.5–2.5)

## 2023-03-20 LAB — FOLATE: Folate: 15.3 ng/mL (ref >5.9)

## 2023-03-20 NOTE — Patient Instructions (Signed)

## 2023-03-20 NOTE — Progress Notes (Signed)
 Subjective:  Patient ID: Kristen [name], female    DOB: February 28, 1946  Age: 78 y.o. MRN: [id number]  CC: Annual Exam, Diabetes, Hyperlipidemia, and Anemia   HPI Kristen [name] presents for a CPX and f/up -----  Discussed the use of AI scribe software for clinical note transcription with the patient, who gave verbal consent to proceed.  History of Present Illness   The patient presents with generalized weakness and reports a recent change in bowel habits, with the last bowel movement being small in quantity. She denies any associated pain but mentions occasional discomfort in the vaginal area. She also reports a recent episode of chills.  The patient expresses dissatisfaction with a recent hospital stay at [facility], citing negative interactions with the staff and a perceived lack of care.  In addition to the above, the patient reports numbness in her feet. She is on a regimen of B12 injections, the last of which was administered during a previous visit with a healthcare provider named Clotilda. The exact timeline of these injections is unclear from the conversation.  The patient also mentions taking oxycodone  for pain management. She reports pain in her toe, but the specifics of this symptom are not elaborated upon. The patient's pulses were assessed and found to be satisfactory.       Outpatient Medications Prior to Visit  Medication Sig Dispense Refill   ALPRAZolam  (XANAX ) 1 MG tablet Take 1 tablet (1 mg total) by mouth 3 (three) times daily as needed. (Patient taking differently: Take 1 mg by mouth 3 (three) times daily.) 270 tablet 0   atorvastatin  (LIPITOR) 40 MG tablet Take 1 tablet (40 mg total) by mouth daily. (Patient taking differently: Take 40 mg by mouth at bedtime.) 90 tablet 0   Cyanocobalamin  (VITAMIN DEFICIENCY SYSTEM-B12) 1000 MCG/ML KIT Inject 1,000 mcg as directed every 30 (thirty) days.     cyclobenzaprine  (FLEXERIL ) 10 MG tablet Take 10 mg by mouth 3 (three)  times daily as needed for muscle spasms.     diclofenac  Sodium (VOLTAREN ) 1 % GEL Apply 2 g topically 4 (four) times daily. (Patient taking differently: Apply 2 g topically 4 (four) times daily as needed (for pain).) 100 g 0   estradiol  (ESTRACE ) 0.1 MG/GM vaginal cream Place 0.5 g vaginally 2 (two) times a week. Place 0.5g nightly for two weeks then twice a week after (Patient taking differently: Place 0.5 g vaginally 2 (two) times a week.) 42.5 g 11   furosemide  (LASIX ) 20 MG tablet Take 1 tablet (20 mg total) by mouth daily. 90 tablet 0   JARDIANCE  25 MG TABS tablet Take 1 tablet (25 mg total) by mouth daily. 90 tablet 0   magnesium  oxide (MAG-OX) 400 MG tablet Take 1 tablet (400 mg total) by mouth 2 (two) times daily. 180 tablet 0   metoprolol  succinate (TOPROL -XL) 100 MG 24 hr tablet Take 1 tablet (100 mg total) by mouth in the morning and at bedtime. 180 tablet 0   nitroGLYCERIN  (NITROSTAT ) 0.4 MG SL tablet Place 1 tablet (0.4 mg total) under the tongue every 5 (five) minutes as needed for chest pain (x 3 doses). Reported on 05/08/2015 35 tablet 2   oxyCODONE -acetaminophen  (PERCOCET) 10-325 MG tablet Take 1 tablet by mouth 5 (five) times daily as needed for pain. (Patient taking differently: Take 1 tablet by mouth See admin instructions. Take 1 tablet by mouth four to five times a day) 150 tablet 0   potassium chloride  SA (KLOR-CON  M) 20 MEQ  tablet Take 1 tablet (20 mEq total) by mouth in the morning and at bedtime. 180 tablet 0   Psyllium (METAMUCIL PO) Take 1 capsule by mouth daily.     sacubitril -valsartan  (ENTRESTO ) 97-103 MG Take 1 tablet by mouth 2 (two) times daily. 180 tablet 3   Semaglutide  (RYBELSUS ) 7 MG TABS Take 1 tablet (7 mg total) by mouth daily. 90 tablet 1   spironolactone  (ALDACTONE ) 25 MG tablet TAKE 1 TABLET BY MOUTH ONCE DAILY *REFILL REQUEST* (Patient taking differently: Take 25 mg by mouth daily.) 90 tablet 0   warfarin (COUMADIN ) 3 MG tablet TAKE ONE TABLET BY MOUTH DAILY  EXCEPT TAKE 1 1/2 TABLETS ON WEDNESDAYS OR AS DIRECTED BY ANTICOAGULATION CLINIC. (Patient taking differently: Take 3-4.5 mg by mouth See admin instructions. Take 3 mg by mouth at bedtime on Sun/Mon/Tues/Thurs/Fri/Sat and 4.5 mg on Wednesdays) 110 tablet 0   gabapentin  (NEURONTIN ) 300 MG capsule TAKE ONE CAPSULE BY MOUTH THREE TIMES DAILY (Patient taking differently: Take 300 mg by mouth See admin instructions. Take 300 mg by mouth two to three times a day) 270 capsule 1   metFORMIN  (GLUCOPHAGE -XR) 500 MG 24 hr tablet TAKE (3) TABLETS BY MOUTH EVERY MORNING WITH BREAKFAST (Patient taking differently: Take 1,500 mg by mouth daily with breakfast.) 90 tablet 0   COMFORT EZ PEN NEEDLES 31G X 8 MM MISC USE WITH insulin  AS DIRECTED (Patient not taking: Reported on 03/20/2023) 100 each 3   No facility-administered medications prior to visit.    ROS Review of Systems  Constitutional: Negative.  Negative for appetite change, diaphoresis and fatigue.  HENT: Negative.    Eyes: Negative.   Respiratory:  Positive for shortness of breath (DOE). Negative for cough, chest tightness and wheezing.   Cardiovascular:  Negative for chest pain, palpitations and leg swelling.  Gastrointestinal:  Positive for constipation and diarrhea. Negative for abdominal pain, blood in stool, nausea and vomiting.  Genitourinary: Negative.  Negative for difficulty urinating and dysuria.  Musculoskeletal:  Positive for gait problem and myalgias. Negative for arthralgias and neck pain.  Skin: Negative.   Neurological:  Positive for weakness. Negative for dizziness and light-headedness.  Hematological:  Negative for adenopathy. Does not bruise/bleed easily.  Psychiatric/Behavioral:  Positive for confusion, decreased concentration and dysphoric mood. Negative for sleep disturbance and suicidal ideas. The patient is nervous/anxious.     Objective:  BP 116/82 (BP Location: Left Arm, Patient Position: Sitting, Cuff Size: Normal)    Pulse 85   Temp (!) 97.5 F (36.4 C) (Oral)   Resp 16   Ht 5' (1.524 m)   Wt 167 lb (75.8 kg)   SpO2 96%   BMI 32.61 kg/m   BP Readings from Last 3 Encounters:  03/20/23 116/82  03/16/23 107/71  03/15/23 129/74    Wt Readings from Last 3 Encounters:  03/20/23 167 lb (75.8 kg)  03/16/23 172 lb 6.4 oz (78.2 kg)  03/14/23 171 lb (77.6 kg)    Physical Exam Vitals reviewed.  Constitutional:      General: She is not in acute distress.    Appearance: She is not toxic-appearing or diaphoretic.  HENT:     Nose: Nose normal.     Mouth/Throat:     Mouth: Mucous membranes are moist.  Eyes:     General: No scleral icterus.    Conjunctiva/sclera: Conjunctivae normal.  Cardiovascular:     Rate and Rhythm: Normal rate.     Heart sounds: No murmur heard. Pulmonary:  Effort: Pulmonary effort is normal.     Breath sounds: No stridor. No wheezing, rhonchi or rales.  Abdominal:     General: Abdomen is protuberant. Bowel sounds are normal. There is no distension.     Palpations: There is no hepatomegaly, splenomegaly or mass.     Tenderness: There is no abdominal tenderness. There is no guarding.     Hernia: No hernia is present.  Musculoskeletal:        General: Normal range of motion.     Cervical back: Neck supple.     Right lower leg: No edema.     Left lower leg: No edema.  Skin:    General: Skin is warm and dry.  Neurological:     General: No focal deficit present.     Mental Status: She is alert. Mental status is at baseline.  Psychiatric:        Mood and Affect: Mood normal.        Behavior: Behavior normal.     Lab Results  Component Value Date   WBC 4.9 03/20/2023   HGB 14.5 03/20/2023   HCT 44.2 03/20/2023   PLT 343.0 03/20/2023   GLUCOSE 99 03/15/2023   CHOL 113 03/20/2023   TRIG 113.0 03/20/2023   HDL 40.00 03/20/2023   LDLDIRECT 68 02/15/2023   LDLCALC 51 03/20/2023   ALT 15 03/13/2023   AST 24 03/13/2023   NA 134 (L) 03/15/2023   K 3.2 (L)  03/15/2023   CL 102 03/15/2023   CREATININE 0.59 03/15/2023   BUN 14 03/15/2023   CO2 22 03/15/2023   TSH 2.99 03/20/2023   INR 1.9 (H) 03/20/2023   HGBA1C 9.9 (H) 03/15/2023   MICROALBUR <0.7 10/20/2022       CT ABDOMEN PELVIS W CONTRAST Result Date: 03/14/2023 CLINICAL DATA:  Abdominal pain. Severe constipation. Concern for stercoral colitis EXAM: CT ABDOMEN AND PELVIS WITH CONTRAST TECHNIQUE: Multidetector CT imaging of the abdomen and pelvis was performed using the standard protocol following bolus administration of intravenous contrast. RADIATION DOSE REDUCTION: This exam was performed according to the departmental dose-optimization program which includes automated exposure control, adjustment of the mA and/or kV according to patient size and/or use of iterative reconstruction technique. CONTRAST:  OMNIPAQUE  IOHEXOL  300 MG/ML  SOLN COMPARISON:  07/22/2021 FINDINGS: Lower chest: No acute abnormality Hepatobiliary: No focal liver abnormality is seen. Status post cholecystectomy. No biliary dilatation. Pancreas: No focal abnormality or ductal dilatation. Spleen: No focal abnormality.  Normal size. Adrenals/Urinary Tract: 1.4 cm cyst in the midpole of the left kidney. No follow-up imaging recommended. No stones or hydronephrosis. Adrenal glands and urinary bladder unremarkable. Stomach/Bowel: Large stool burden in the rectum suggesting fecal impaction. No evidence of stercoral colitis. Large stool burden throughout the remainder of the colon suggesting constipation. Stomach and small bowel decompressed, unremarkable. Vascular/Lymphatic: Aortic atherosclerosis. No evidence of aneurysm or adenopathy. Reproductive: Prior hysterectomy. No adnexal masses. Structure in the vagina, presumably pessary. Other: No free fluid or free air. Musculoskeletal: No acute bony abnormality. IMPRESSION: Large stool burden in the rectum and throughout the colon suggesting fecal impaction and constipation. No evidence  of stercoral colitis. Aortic atherosclerosis. Electronically Signed   By: Franky Crease M.D.   On: 03/14/2023 01:15   DG Chest 2 View Result Date: 03/13/2023 CLINICAL DATA:  Query sepsis, constipation EXAM: CHEST - 2 VIEW COMPARISON:  07/19/2022 FINDINGS: Stable cardiomediastinal silhouette. Sternotomy and CABG. Left basilar atelectasis or scarring. Otherwise no focal consolidation, pleural effusion, or pneumothorax.  No displaced rib fractures. Elevated right hemidiaphragm containing gaseous distention of the colon at the hepatic flexure. IMPRESSION: No active cardiopulmonary disease. Electronically Signed   By: Norman Gatlin M.D.   On: 03/13/2023 23:54    Assessment & Plan:  Stage 3a chronic kidney disease (HCC)  Drug-induced constipation -     Magnesium ; Future -     TSH; Future  Acquired hypothyroidism- She is euthyroid. -     TSH; Future  Encounter for general adult medical examination with abnormal findings- Exam completed, labs reviewed, vaccines reviewed, no cancer screenings indicated, pt ed material was given.   Insulin -requiring or dependent type II diabetes mellitus (HCC) -     HM Diabetes Foot Exam -     metFORMIN  HCl ER; Take 3 tablets (1,500 mg total) by mouth daily with breakfast. TAKE (3) TABLETS BY MOUTH EVERY MORNING WITH BREAKFAST  Dispense: 270 tablet; Refill: 0 -     AMB Referral [facility]BCI Care Management  Deficiency anemia- H/H are normal now. -     IBC + Ferritin; Future -     Vitamin B1; Future -     Zinc ; Future -     Folate; Future -     CBC with Differential/Platelet; Future -     Vitamin B12; Future -     Reticulocytes; Future  Hyperlipidemia associated with type 2 diabetes mellitus (HCC) -     Lipid panel; Future  Long term current use of anticoagulant -     Protime-INR; Future  Fibromyalgia -     Gabapentin ; Take 1 capsule (300 mg total) by mouth 3 (three) times daily.  Dispense: 270 capsule; Refill: 1  DM (diabetes mellitus), type 2 with peripheral  vascular complications (HCC) -     Gabapentin ; Take 1 capsule (300 mg total) by mouth 3 (three) times daily.  Dispense: 270 capsule; Refill: 1  Chronic idiopathic constipation     Follow-up: Return in about 3 months (around 06/18/2023).  Debby Molt, MD

## 2023-03-21 ENCOUNTER — Inpatient Hospital Stay: Payer: Medicare Other | Admitting: Internal Medicine

## 2023-03-21 ENCOUNTER — Telehealth: Payer: Self-pay

## 2023-03-21 ENCOUNTER — Ambulatory Visit (INDEPENDENT_AMBULATORY_CARE_PROVIDER_SITE_OTHER): Payer: Medicare Other

## 2023-03-21 ENCOUNTER — Other Ambulatory Visit (HOSPITAL_COMMUNITY): Payer: Self-pay

## 2023-03-21 ENCOUNTER — Other Ambulatory Visit: Payer: Self-pay | Admitting: Internal Medicine

## 2023-03-21 DIAGNOSIS — Z7901 Long term (current) use of anticoagulants: Secondary | ICD-10-CM | POA: Diagnosis not present

## 2023-03-21 DIAGNOSIS — E1151 Type 2 diabetes mellitus with diabetic peripheral angiopathy without gangrene: Secondary | ICD-10-CM

## 2023-03-21 DIAGNOSIS — M797 Fibromyalgia: Secondary | ICD-10-CM

## 2023-03-21 MED ORDER — GABAPENTIN 300 MG PO CAPS
300.0000 mg | ORAL_CAPSULE | Freq: Three times a day (TID) | ORAL | 1 refills | Status: DC
  Filled 2023-03-21: qty 270, 90d supply, fill #0
  Filled 2023-07-03: qty 270, 90d supply, fill #1

## 2023-03-21 NOTE — Telephone Encounter (Signed)
 Copied from CRM [id number]. Topic: Clinical - Medication Question >> Mar 21, 2023 11:29 AM Joanell B wrote: Reason for CRM: Pt stated that she would like to receive a call back from [facility] regarding some concerns about her coumadin  levels and would like to know if she needs to stop of continue taking her warfarin (COUMADIN ) 3 MG tablet

## 2023-03-21 NOTE — Progress Notes (Signed)
 Pt admitted to hospital from 1/6 to 1/8 for sepsis and constipation.  Increase dose today to take 1 1/2 tablets and then change weekly dose to take 1 tablet daily except take 1 1/2 tablets on Sunday and Wednesday. Recheck INR  in 2 weeks.

## 2023-03-21 NOTE — Patient Instructions (Addendum)
 Pre visit review using our clinic review tool, if applicable. No additional management support is needed unless otherwise documented below in the visit note.  Increase dose today to take 1 1/2 tablets and then change weekly dose to take 1 tablet daily except take 1 1/2 tablets on Sunday and Wednesday. Recheck INR  in 2 weeks.

## 2023-03-21 NOTE — Telephone Encounter (Signed)
 Contacted pt and advised. Documentation in anticoagulation encounter.

## 2023-03-22 ENCOUNTER — Other Ambulatory Visit (HOSPITAL_COMMUNITY): Payer: Self-pay

## 2023-03-22 ENCOUNTER — Ambulatory Visit: Payer: Medicare Other | Admitting: Podiatry

## 2023-03-23 ENCOUNTER — Telehealth: Payer: Self-pay

## 2023-03-23 NOTE — Progress Notes (Signed)
Care Guide Pharmacy Note  03/23/2023 Name: Jynelle Depaola MRN: [id number] DOB: 1945/06/23  Referred By: Etta Grandchild, MD Reason for referral: Care Coordination ([facility] Diabetes. )   Kayliann Hrncir is a 78 y.o. year old female who is a primary care patient of Etta Grandchild, MD.  [name] was referred to the pharmacist for assistance related to: DMII  Successful contact was made with the patient to discuss pharmacy services including being ready for the pharmacist to call at least 5 minutes before the scheduled appointment time and to have medication bottles and any blood pressure readings ready for review. The patient agreed to meet with the pharmacist via telephone visit on (date/time). 03/29/23 at 10:30 a.m.  [facility]  [facility], [facility] Health Care Management Assistant Direct Dial: [phone]  Fax: [phone]

## 2023-03-24 ENCOUNTER — Other Ambulatory Visit: Payer: Self-pay

## 2023-03-24 ENCOUNTER — Telehealth: Payer: Self-pay | Admitting: Internal Medicine

## 2023-03-24 ENCOUNTER — Other Ambulatory Visit (HOSPITAL_COMMUNITY): Payer: Self-pay

## 2023-03-24 LAB — EXTRA SPECIMEN

## 2023-03-24 LAB — ZINC: Zinc: 74 ug/dL (ref 60–130)

## 2023-03-24 LAB — VITAMIN B1: Vitamin B1 (Thiamine): <6 nmol/L — ABNORMAL LOW (ref 8–30)

## 2023-03-24 LAB — RETICULOCYTES

## 2023-03-24 MED ORDER — METFORMIN HCL ER 500 MG PO TB24
1500.0000 mg | ORAL_TABLET | Freq: Every day | ORAL | 0 refills | Status: DC
Start: 2023-03-24 — End: 2023-04-03
  Filled 2023-03-24: qty 270, 90d supply, fill #0

## 2023-03-24 NOTE — Telephone Encounter (Signed)
Please advise next steps

## 2023-03-24 NOTE — Telephone Encounter (Signed)
Copied from CRM [id number]. Topic: Clinical - Lab/Test Results >> Mar 24, 2023  9:29 AM Elizebeth Brooking wrote: Reason for CRM: [facility]  Unable to do test because specimen received was frozen  ref [id number] C  [id number]

## 2023-03-25 ENCOUNTER — Other Ambulatory Visit: Payer: Self-pay | Admitting: Internal Medicine

## 2023-03-25 ENCOUNTER — Other Ambulatory Visit (HOSPITAL_COMMUNITY): Payer: Self-pay

## 2023-03-25 DIAGNOSIS — E519 Thiamine deficiency, unspecified: Secondary | ICD-10-CM

## 2023-03-25 MED ORDER — VITAMIN B-1 50 MG PO TABS
50.0000 mg | ORAL_TABLET | Freq: Every day | ORAL | 1 refills | Status: DC
  Filled 2023-03-25: qty 100, 100d supply, fill #0
  Filled 2023-07-02: qty 100, 100d supply, fill #1

## 2023-03-25 NOTE — Progress Notes (Signed)
Ask her to start taking 50 mg of Vit B1 once a day.

## 2023-03-27 ENCOUNTER — Ambulatory Visit: Payer: Medicare Other | Admitting: Internal Medicine

## 2023-03-27 ENCOUNTER — Other Ambulatory Visit: Payer: Self-pay

## 2023-03-27 ENCOUNTER — Other Ambulatory Visit (HOSPITAL_COMMUNITY): Payer: Self-pay

## 2023-03-27 ENCOUNTER — Encounter: Payer: Self-pay | Admitting: Internal Medicine

## 2023-03-27 ENCOUNTER — Telehealth: Payer: Self-pay

## 2023-03-27 NOTE — Telephone Encounter (Signed)
PAP: Application for Rybelsus has been submitted to [employer], online   PLEASE BE ADVISE I WILL BE FAXING PROVIDER PAGES TO PROVIDER OFFICE OF Kristen [name] FOR REVIEWING AND PROCESSING OF PROVIDER PAGES.

## 2023-03-28 ENCOUNTER — Other Ambulatory Visit: Payer: Self-pay

## 2023-03-28 ENCOUNTER — Other Ambulatory Visit (HOSPITAL_COMMUNITY): Payer: Self-pay

## 2023-03-28 ENCOUNTER — Other Ambulatory Visit: Payer: Self-pay | Admitting: Obstetrics and Gynecology

## 2023-03-28 DIAGNOSIS — N3281 Overactive bladder: Secondary | ICD-10-CM

## 2023-03-28 DIAGNOSIS — R350 Frequency of micturition: Secondary | ICD-10-CM

## 2023-03-28 NOTE — Progress Notes (Signed)
Pharmacy Medication Assistance Program Note    03/28/2023  Patient ID: Kristen [name], female   DOB: 04-27-1945, 78 y.o.   MRN: [id number]     03/28/2023  [facility] [facility]  Man[employer]urer [employer] [facility] Rybelsus  Dose of Rybelsus 7MG   Type of Radiographer, therapeutic Assistance  Date Application Sent to Patient 03/27/2023  Application Items Requested Application  Date Application Sent to Prescriber 03/27/2023  Name of Prescriber Sanda Linger  Method Application Sent to [employer]

## 2023-03-28 NOTE — Telephone Encounter (Signed)
RECEIVED PT INCOME FOR 2025 AND SUBMITTED TO COMPANY FOR PROCESSING FOR NOVO NORDISK(RYBELSUS)  PLEASE BE ADVISED

## 2023-03-29 ENCOUNTER — Encounter: Payer: Self-pay | Admitting: Pharmacist

## 2023-03-29 ENCOUNTER — Telehealth: Payer: Self-pay

## 2023-03-29 ENCOUNTER — Other Ambulatory Visit (INDEPENDENT_AMBULATORY_CARE_PROVIDER_SITE_OTHER): Payer: Medicare Other | Admitting: Pharmacist

## 2023-03-29 ENCOUNTER — Ambulatory Visit: Payer: Medicare Other | Admitting: Podiatry

## 2023-03-29 ENCOUNTER — Other Ambulatory Visit (HOSPITAL_BASED_OUTPATIENT_CLINIC_OR_DEPARTMENT_OTHER): Payer: Self-pay

## 2023-03-29 ENCOUNTER — Other Ambulatory Visit: Payer: Self-pay

## 2023-03-29 DIAGNOSIS — E119 Type 2 diabetes mellitus without complications: Secondary | ICD-10-CM

## 2023-03-29 DIAGNOSIS — E1151 Type 2 diabetes mellitus with diabetic peripheral angiopathy without gangrene: Secondary | ICD-10-CM

## 2023-03-29 DIAGNOSIS — Z7984 Long term (current) use of oral hypoglycemic drugs: Secondary | ICD-10-CM

## 2023-03-29 MED ORDER — GEMTESA 75 MG PO TABS
75.0000 mg | ORAL_TABLET | Freq: Every day | ORAL | 5 refills | Status: DC
  Filled 2023-03-29 – 2023-04-11 (×3): qty 30, 30d supply, fill #0

## 2023-03-29 NOTE — Telephone Encounter (Signed)
PAM WARD REACH OUT REGARDING GRANTS OR PAP  GEMTESA   NO PAP AVAILABLE

## 2023-03-29 NOTE — Telephone Encounter (Signed)
PROVIDER PAGES RECEIVED AND I HAVE SENT for Rybelsus has been submitted to [employer], via fax.  PLEASE BE ADVISED

## 2023-03-29 NOTE — Progress Notes (Signed)
04/03/2023 Name: Kristen [name] MRN: [id number] DOB: [birth year]  Chief Complaint  Patient presents with   Diabetes   Medication Management   [place] Metric Diabetes    Kristen [name] is a 78 y.o. year old female who presented for a telephone visit.   They were referred to the pharmacist by their PCP for assistance in managing diabetes.   Subjective:  Care Team: Primary Care Provider: Etta Grandchild, MD ; Next Scheduled Visit: 06/19/23   Medication Access/Adherence  Current Pharmacy:  [facility] [address] [place] [address] Phone: [phone] Fax: [phone]   Patient reports affordability concerns with their medications: Yes  Patient reports access/transportation concerns to their pharmacy: No  Patient reports adherence concerns with their medications:  Yes    Pt has been out of Rybelsus x2 days. She did PAP re-enrollment, waiting on shipment  Pt also has gotten Jardiance and Entresto through PAP but has not received information regarding re-enrollment.  Diabetes:  Current medications: Rybelsus 7 mg daily (currently out), Jardiance 25 mg daily, metformin XR 500 mg 4 tablets daily Medications tried in the past: glipizide, Novolog, Levemir, Toujeo, Victoza, Ozempic  Used to have CGM but had been unable to get it since insurance would not approve it. She is unsure why she was taken off of Toujeo or Ozempic - not they just stopped sending refills?   Current meal patterns: Notes she does tend to order out more now. Recently got dentures and has had difficulty adjusting to eating with them. Her husband passed away a year ago and this has affected her ability to know how to cook at home for just her.  Current physical activity: limited due to arthritis/pain  Current medication access support: Jardiance and Rybelsus PAP  Constipation: Currently taking miralax 1 capful daily Pt reports no BM since Saturday or  sunday  Objective:  Lab Results  Component Value Date   HGBA1C 9.9 (H) 03/15/2023    Lab Results  Component Value Date   CREATININE 0.59 03/15/2023   BUN 14 03/15/2023   NA 134 (L) 03/15/2023   K 3.2 (L) 03/15/2023   CL 102 03/15/2023   CO2 22 03/15/2023    Lab Results  Component Value Date   CHOL 113 03/20/2023   HDL 40.00 03/20/2023   LDLCALC 51 03/20/2023   LDLDIRECT 68 02/15/2023   TRIG 113.0 03/20/2023   CHOLHDL 3 03/20/2023    Medications Reviewed Today     Reviewed by Bonita Quin, RPH (Pharmacist) on 04/03/23 at 1556  Med List Status: <None>   Medication Order Taking? Sig Documenting Provider Last Dose Status Informant  ALPRAZolam (XANAX) 1 MG tablet [id number]  Take 1 tablet (1 mg total) by mouth 3 (three) times daily as needed.  Patient taking differently: Take 1 mg by mouth 3 (three) times daily.   Etta Grandchild, MD  Active Self  atorvastatin (LIPITOR) 40 MG tablet [id number]  Take 1 tablet (40 mg total) by mouth daily.  Patient taking differently: Take 40 mg by mouth at bedtime.   Etta Grandchild, MD  Active Self  Blood Glucose Monitoring Suppl (BLOOD GLUCOSE MONITOR SYSTEM) w/Device KIT [id number] Yes Use 1 in the morning, at noon, and at bedtime. Etta Grandchild, MD  Active   COMFORT EZ PEN NEEDLES 31G X 8 MM MISC [id number]  USE WITH insulin AS DIRECTED  Patient not taking: Reported on 03/20/2023   Etta Grandchild, MD  Active            Med Note Michaela Corner   Fri Mar 17, 2023  2:41 PM) 03/17/23: reports during [facility] call- does not have/ use/ not taking insulin  Cyanocobalamin (VITAMIN DEFICIENCY SYSTEM-B12) 1000 MCG/ML KIT [id number]  Inject 1,000 mcg as directed every 30 (thirty) days. [provider]  Active Self  cyclobenzaprine (FLEXERIL) 10 MG tablet [id number]  Take 10 mg by mouth 3 (three) times daily as needed for muscle spasms. [provider]  Active Self  diclofenac Sodium (VOLTAREN) 1 % GEL [id number]  Apply 2 g  topically 4 (four) times daily.  Patient taking differently: Apply 2 g topically 4 (four) times daily as needed (for pain).   Sheikh, Omair [place], [place]  Active Self  estradiol (ESTRACE) 0.1 MG/GM vaginal cream [id number]  Place 0.5 g vaginally 2 (two) times a week. Place 0.5g nightly for two weeks then twice a week after  Patient taking differently: Place 0.5 g vaginally 2 (two) times a week.   Selmer Dominion, NP  Active Self  furosemide (LASIX) 20 MG tablet [id number]  Take 1 tablet (20 mg total) by mouth daily. Etta Grandchild, MD  Active Self  gabapentin (NEURONTIN) 300 MG capsule [id number]  Take 1 capsule (300 mg total) by mouth 3 (three) times daily. Etta Grandchild, MD  Active   Glucose Blood (BLOOD GLUCOSE TEST STRIPS) STRP [id number] Yes Use 1 strip to check blood sugar in the morning, at noon, and at bedtime. Etta Grandchild, MD  Active   JARDIANCE 25 MG TABS tablet [id number] Yes Take 1 tablet (25 mg total) by mouth daily. Etta Grandchild, MD Taking Active Self  Lancet Device MISC [id number] Yes 1 each by Does not apply route in the morning, at noon, and at bedtime. May substitute to any manufacturer covered by patient's insurance. Etta Grandchild, MD  Active   Lancets MISC [id number] Yes Use 1 in the morning, at noon, and at bedtime. Etta Grandchild, MD  Active   magnesium oxide (MAG-OX) 400 MG tablet [id number] Yes Take 1 tablet (400 mg total) by mouth 2 (two) times daily. Etta Grandchild, MD Taking Active Self  metFORMIN (GLUCOPHAGE-XR) 500 MG 24 hr tablet [id number] Yes Take 3 tablets (1,500 mg total) by mouth daily with breakfast.  Patient taking differently: Take 2,000 mg by mouth daily with breakfast.   Etta Grandchild, MD Taking Active   metoprolol succinate (TOPROL-XL) 100 MG 24 hr tablet [id number] Yes Take 1 tablet (100 mg total) by mouth in the morning and at bedtime. Etta Grandchild, MD Taking Active Self           Med Note Antony Madura, Arn Medal   Tue Mar 14, 2023  6:36 PM) Last received  HERE- at [facility]  nitroGLYCERIN (NITROSTAT) 0.4 MG SL tablet [id number]  Place 1 tablet (0.4 mg total) under the tongue every 5 (five) minutes as needed for chest pain (x 3 doses). Reported on 05/08/2015 Tonny Bollman, MD  Active Self           Med Note Daphine Deutscher, LINDA   Wed May 25, 2022  2:57 PM)    oxyCODONE-acetaminophen (PERCOCET) 10-325 MG tablet [id number]  Take 1 tablet by mouth 5 (five) times daily as needed for pain.  Patient taking differently: Take 1 tablet by mouth See admin instructions. Take 1 tablet by mouth four to five times a day     Active Self  polyethylene glycol (  MIRALAX MIX-IN PAX) 17 g packet [id number] Yes Take 17 g by mouth daily. [provider] Taking Active   potassium chloride SA (KLOR-CON M) 20 MEQ tablet [id number]  Take 1 tablet (20 mEq total) by mouth in the morning and at bedtime. Etta Grandchild, MD  Active Self  Psyllium (METAMUCIL PO) [id number]  Take 1 capsule by mouth daily. [provider]  Active Self           Med Note Antony Madura, Arn Medal   Tue Mar 14, 2023  6:15 PM) The patient states she often forgets to take this  sacubitril-valsartan (ENTRESTO) 97-103 MG [id number] Yes Take 1 tablet by mouth 2 (two) times daily. Tonny Bollman, MD Taking Active Self           Med Note Antony Madura, Arn Medal   Tue Mar 14, 2023  6:51 PM) I called Cover My Meds. They said this WAS last filled on 01/26/2023 for a 90-day's supply (and mailed).  Semaglutide (RYBELSUS) 7 MG TABS [id number] No Take 1 tablet (7 mg total) by mouth daily.  Patient not taking: Reported on 03/29/2023   Etta Grandchild, MD Not Taking Active Self           Med Note Camila Li, Lauralynn Loeb R   Wed Mar 29, 2023 10:47 AM) Currently out - waiting on shipment from PAP  spironolactone (ALDACTONE) 25 MG tablet [id number]  TAKE 1 TABLET BY MOUTH ONCE DAILY *REFILL REQUEST*  Patient taking differently: Take 25 mg by mouth daily.   Etta Grandchild, MD  Active Self  thiamine (VITAMIN B-1) 50 MG tablet [id number]  Take 1  tablet (50 mg total) by mouth daily. Etta Grandchild, MD  Active   Vibegron [facility]) 75 MG TABS [id number]  Take 1 tablet (75 mg total) by mouth daily. Selmer Dominion, NP  Active   warfarin (COUMADIN) 3 MG tablet [id number]  TAKE ONE TABLET BY MOUTH DAILY EXCEPT TAKE 1 1/2 TABLETS ON WEDNESDAYS OR AS DIRECTED BY ANTICOAGULATION CLINIC.  Patient taking differently: Take 3-4.5 mg by mouth See admin instructions. Take 3 mg by mouth at bedtime on Sun/Mon/Tues/Thurs/Fri/Sat and 4.5 mg on Wednesdays   Etta Grandchild, MD  Active Self           Med Note Antony Madura, Arn Medal   Tue Mar 14, 2023  6:36 PM) Last received HERE- at [facility]              Assessment/Plan:   Diabetes: - Currently uncontrolled, A1c goal <8% - Reviewed long term cardiovascular and renal outcomes of uncontrolled blood sugar - Reviewed goal A1c, goal fasting, and goal 2 hour post prandial glucose - Reviewed dietary modifications including increasing protein/fiber, balanced meals/snacks - Reviewed lifestyle modifications including: increased movement as able - Recommend to continue current regimen. Consider changing Rybelsus to Ozempic. May need to add back basal insulin. Will sort out PAP first. - Recommend to check glucose daily - will send Rx for glucometer - Awaiting Rybelsus shipment. Will complete Jardiance PAP.  Constipation: - Currently uncontrolled. Recommended increasing Miralax to 1.5 capful per day and add docusate daily to take everyday, not PRN.  Med Access: Will also complete Entresto PAP, pt to sign when she comes to clinic 1/28 for Coumadin clinic.  Follow Up Plan: 2/10  Arbutus Leas, PharmD, BCPS, CPP Clinical Pharmacist Practitioner [place] [facility] at [facility] [phone]

## 2023-03-29 NOTE — Telephone Encounter (Signed)
Kristen [name] is closed and no other grants available, can we look into PAP options please?

## 2023-03-30 ENCOUNTER — Other Ambulatory Visit: Payer: Self-pay

## 2023-03-30 ENCOUNTER — Other Ambulatory Visit (HOSPITAL_BASED_OUTPATIENT_CLINIC_OR_DEPARTMENT_OTHER): Payer: Self-pay

## 2023-03-30 MED ORDER — LANCET DEVICE MISC
1.0000 | Freq: Three times a day (TID) | 0 refills | Status: AC
  Filled 2023-03-30 – 2023-04-10 (×2): qty 1, 30d supply, fill #0

## 2023-03-30 MED ORDER — BLOOD GLUCOSE TEST VI STRP
1.0000 | ORAL_STRIP | Freq: Three times a day (TID) | 0 refills | Status: DC
  Filled 2023-03-30: qty 100, 34d supply, fill #0

## 2023-03-30 MED ORDER — BLOOD GLUCOSE MONITOR SYSTEM W/DEVICE KIT
1.0000 | PACK | Freq: Three times a day (TID) | 0 refills | Status: AC
  Filled 2023-03-30: qty 1, 30d supply, fill #0

## 2023-03-30 MED ORDER — LANCETS MISC
1.0000 | Freq: Three times a day (TID) | 0 refills | Status: DC
  Filled 2023-03-30: qty 100, 30d supply, fill #0

## 2023-03-31 ENCOUNTER — Other Ambulatory Visit (HOSPITAL_COMMUNITY): Payer: Self-pay

## 2023-03-31 ENCOUNTER — Ambulatory Visit: Payer: Self-pay | Admitting: Internal Medicine

## 2023-03-31 ENCOUNTER — Other Ambulatory Visit: Payer: Self-pay

## 2023-03-31 NOTE — Telephone Encounter (Signed)
Chief Complaint: Constipation Symptoms: no bowel movement since Saturday. Patient endorses passing gas Frequency: Since Saturday Pertinent Negatives: Patient denies abdominal pain, nausea.vomiting Disposition: [] ED /[] Urgent Care (no appt availability in office) / [] Appointment(In office/virtual)/ []  [place] [facility]/ [x] Home Care/ [] Refused Recommended Disposition /[] [place] [facility]/ []  Follow-up with PCP Additional Notes: patient with complicated history c/o no bowel movement since Saturday. Patient is on regular narcotics and has been taking Miralax. Patient endorses that she has had a small appetite and not eating much. Patient is also dealing with feelings of sadness since the death of her husband. Patient lives in an independent living community-states she feels better when she is with other people but sadness comes on when she is lonely. Patient denies SI or HI-states she has just been dealing with this for awhile. Refused appointment at this time. Per protocol for constipation, recommendation is home care at this time. OTC medication suggestions along with dietary suggestions given to patient. Patient verbalized understanding of plan and all questions answered.   Copied from CRM [id number]. Topic: Clinical - Red Word Triage >> Mar 31, 2023 12:10 PM Almira Coaster wrote: Red Word that prompted transfer to Nurse Triage: Patient has not been able to pass a bowel movement for a week. She is also feeling with slight weakness. Reason for Disposition  Over-The-Counter (OTC) medicines for constipation, questions about  Answer Assessment - Initial Assessment Questions 1. STOOL PATTERN OR FREQUENCY: "How often do you have a bowel movement (BM)?"  (Normal range: 3 times a day to every 3 days)  "When was your last BM?"       Last bowel movement was Saturday.  2. STRAINING: "Do you have to strain to have a BM?"      Yes 3. RECTAL PAIN: "Does your rectum hurt when the stool comes out?" If Yes,  ask: "Do you have hemorrhoids? How bad is the pain?"  (Scale 1-10; or mild, moderate, severe)     No 4. STOOL COMPOSITION: "Are the stools hard?"      No 5. BLOOD ON STOOLS: "Has there been any blood on the toilet tissue or on the surface of the BM?" If Yes, ask: "When was the last time?"     No 6. CHRONIC CONSTIPATION: "Is this a new problem for you?"  If No, ask: "How long have you had this problem?" (days, weeks, months)      yes 7. CHANGES IN DIET OR HYDRATION: "Have there been any recent changes in your diet?" "How much fluids are you drinking on a daily basis?"  "How much have you had to drink today?"     Not eating a lot but is drinking  8. MEDICINES: "Have you been taking any new medicines?" "Are you taking any narcotic pain medicines?" (e.g., Dilaudid, morphine, Percocet, Vicodin)      9. LAXATIVES: "Have you been using any stool softeners, laxatives, or enemas?"  If Yes, ask "What, how often, and when was the last time?"     Miralax 10. ACTIVITY:  "How much walking do you do every day?"  "Has your activity level decreased in the past week?"        Not walking a lot because of weakness 11. CAUSE: "What do you think is causing the constipation?"        Not sure 12. OTHER SYMPTOMS: "Do you have any other symptoms?" (e.g., abdomen pain, bloating, fever, vomiting)       No 13. MEDICAL HISTORY: "Do you have a  history of hemorrhoids, rectal fissures, or rectal surgery or rectal abscess?"         No  Protocols used: Constipation-A-AH

## 2023-04-03 ENCOUNTER — Other Ambulatory Visit: Payer: Self-pay

## 2023-04-03 ENCOUNTER — Other Ambulatory Visit (HOSPITAL_COMMUNITY): Payer: Self-pay

## 2023-04-03 MED ORDER — METFORMIN HCL ER 500 MG PO TB24
2000.0000 mg | ORAL_TABLET | Freq: Every day | ORAL | 2 refills | Status: AC
  Filled 2023-04-03 – 2023-07-03 (×3): qty 120, 30d supply, fill #0
  Filled 2024-02-21: qty 120, 30d supply, fill #1
  Filled 2024-03-18: qty 120, 30d supply, fill #2

## 2023-04-03 NOTE — Patient Instructions (Addendum)
It was a pleasure speaking with you today!  We are working on getting your medications in from the patient assistance programs.  Start checking blood sugars at home at least once daily when you receive your new blood sugar monitor.  Feel free to call with any questions or concerns!  Arbutus Leas, PharmD, BCPS, CPP Clinical Pharmacist Practitioner [place] [facility] at [facility] [phone]

## 2023-04-04 ENCOUNTER — Ambulatory Visit (INDEPENDENT_AMBULATORY_CARE_PROVIDER_SITE_OTHER): Payer: Medicare Other

## 2023-04-04 DIAGNOSIS — Z7901 Long term (current) use of anticoagulants: Secondary | ICD-10-CM

## 2023-04-04 DIAGNOSIS — D510 Vitamin B12 deficiency anemia due to intrinsic factor deficiency: Secondary | ICD-10-CM

## 2023-04-04 LAB — POCT INR: INR: 2.3 (ref 2.0–3.0)

## 2023-04-04 MED ORDER — CYANOCOBALAMIN 1000 MCG/ML IJ SOLN
1000.0000 ug | Freq: Once | INTRAMUSCULAR | Status: AC
  Administered 2023-04-04: 1000 ug via INTRAMUSCULAR

## 2023-04-04 NOTE — Patient Instructions (Addendum)
Pre visit review using our clinic review tool, if applicable. No additional management support is needed unless otherwise documented below in the visit note.  Continue 1 tablet daily except take 1 1/2 tablets on Sunday and Wednesday. Recheck INR  in 5 weeks.

## 2023-04-04 NOTE — Progress Notes (Signed)
Continue 1 tablet daily except take 1 1/2 tablets on Sunday and Wednesday. Recheck INR  in 5 weeks. Pt requested B12 injection. Last injection given on 02/24/23. Ok for injection  per PCP. Administered B12 injection. Pt tolerated well.

## 2023-04-05 ENCOUNTER — Other Ambulatory Visit: Payer: Self-pay

## 2023-04-05 ENCOUNTER — Other Ambulatory Visit: Payer: Self-pay | Admitting: Internal Medicine

## 2023-04-05 ENCOUNTER — Other Ambulatory Visit (HOSPITAL_COMMUNITY): Payer: Self-pay

## 2023-04-05 DIAGNOSIS — Z7901 Long term (current) use of anticoagulants: Secondary | ICD-10-CM

## 2023-04-05 MED ORDER — FUROSEMIDE 20 MG PO TABS
20.0000 mg | ORAL_TABLET | Freq: Every day | ORAL | 0 refills | Status: DC
  Filled 2023-04-05: qty 90, 90d supply, fill #0

## 2023-04-05 MED ORDER — WARFARIN SODIUM 3 MG PO TABS
3.0000 mg | ORAL_TABLET | Freq: Every day | ORAL | 0 refills | Status: DC
  Filled 2023-04-05: qty 110, 90d supply, fill #0

## 2023-04-06 ENCOUNTER — Encounter: Payer: Self-pay | Admitting: Internal Medicine

## 2023-04-06 ENCOUNTER — Other Ambulatory Visit (HOSPITAL_COMMUNITY): Payer: Self-pay

## 2023-04-07 ENCOUNTER — Other Ambulatory Visit (HOSPITAL_COMMUNITY): Payer: Self-pay

## 2023-04-07 MED ORDER — OXYCODONE-ACETAMINOPHEN 7.5-325 MG PO TABS
1.0000 | ORAL_TABLET | Freq: Every day | ORAL | 0 refills | Status: DC
  Filled 2023-04-07 (×2): qty 150, 30d supply, fill #0

## 2023-04-08 ENCOUNTER — Other Ambulatory Visit (HOSPITAL_COMMUNITY): Payer: Self-pay

## 2023-04-10 ENCOUNTER — Other Ambulatory Visit (HOSPITAL_COMMUNITY): Payer: Self-pay

## 2023-04-10 ENCOUNTER — Other Ambulatory Visit: Payer: Self-pay

## 2023-04-10 ENCOUNTER — Other Ambulatory Visit: Payer: Self-pay | Admitting: Internal Medicine

## 2023-04-10 DIAGNOSIS — F411 Generalized anxiety disorder: Secondary | ICD-10-CM

## 2023-04-10 MED ORDER — ALPRAZOLAM 1 MG PO TABS
1.0000 mg | ORAL_TABLET | Freq: Three times a day (TID) | ORAL | 0 refills | Status: DC | PRN
  Filled 2023-04-10 – 2023-06-06 (×2): qty 270, 90d supply, fill #0

## 2023-04-11 ENCOUNTER — Other Ambulatory Visit (HOSPITAL_COMMUNITY): Payer: Self-pay

## 2023-04-11 ENCOUNTER — Other Ambulatory Visit: Payer: Self-pay

## 2023-04-13 ENCOUNTER — Telehealth: Payer: Self-pay | Admitting: Pharmacy Technician

## 2023-04-13 NOTE — Telephone Encounter (Signed)
    Scan in media as well

## 2023-04-17 ENCOUNTER — Other Ambulatory Visit (INDEPENDENT_AMBULATORY_CARE_PROVIDER_SITE_OTHER): Payer: Medicare Other | Admitting: Pharmacist

## 2023-04-17 DIAGNOSIS — E1151 Type 2 diabetes mellitus with diabetic peripheral angiopathy without gangrene: Secondary | ICD-10-CM

## 2023-04-17 DIAGNOSIS — Z794 Long term (current) use of insulin: Secondary | ICD-10-CM

## 2023-04-17 DIAGNOSIS — E119 Type 2 diabetes mellitus without complications: Secondary | ICD-10-CM

## 2023-04-17 MED ORDER — TRESIBA FLEXTOUCH 100 UNIT/ML ~~LOC~~ SOPN
14.0000 [IU] | PEN_INJECTOR | Freq: Every day | SUBCUTANEOUS | 1 refills | Status: DC
  Filled 2023-04-17: qty 3, 21d supply, fill #0
  Filled 2023-05-04: qty 3, 21d supply, fill #1

## 2023-04-17 MED ORDER — COMFORT EZ PEN NEEDLES 31G X 8 MM MISC
3 refills | Status: AC
  Filled 2023-04-17: qty 100, 100d supply, fill #0
  Filled 2023-11-18: qty 100, 100d supply, fill #1

## 2023-04-17 NOTE — Progress Notes (Signed)
   04/18/2023 Name: Kristen [name] MRN: [id number] DOB: [birth year]  Chief Complaint  Patient presents with   Diabetes   Medication Management    Kristen [name] is a 78 y.o. year old female who presented for a telephone visit.   They were referred to the pharmacist by their PCP for assistance in managing diabetes.   Subjective:  Care Team: Primary Care Provider: Etta Grandchild, MD ; Next Scheduled Visit: 06/19/23   Medication Access/Adherence  [facility]:  [facility] [address] [place] [address] Phone: [phone] Fax: [phone]   Patient reports affordability concerns with their medications: Yes  Patient reports access/transportation concerns to their pharmacy: No  Patient reports adherence concerns with their medications:  Yes    Pt has been out of Rybelsus. She did PAP re-enrollment, waiting on shipment  Pt also has gotten Jardiance and Entresto through PAP but has not received yet this year.  Diabetes:  Current medications: Rybelsus 7 mg daily (currently out), Jardiance 25 mg daily, metformin XR 500 mg 4 tablets daily Medications tried in the past: glipizide, Novolog, Levemir, Toujeo, Victoza, Ozempic  Used to have CGM but had been unable to get it since insurance would not approve it. She is unsure why she was taken off of Toujeo or Ozempic - not sure if they just stopped sending refills?  Pt reports BG AM 130s, evenings 180s  Current meal patterns: Notes she does tend to order out more now. Recently got dentures and has had difficulty adjusting to eating with them. Her husband passed away a year ago and this has affected her ability to know how to cook at home for just her.  Current physical activity: limited due to arthritis/pain  Current medication access support: Jardiance and Rybelsus PAP  Constipation: Currently taking miralax 1 capful daily Pt reports no BM since Saturday or  sunday  Objective:  Lab Results  Component Value Date   HGBA1C 9.9 (H) 03/15/2023    Lab Results  Component Value Date   CREATININE 0.59 03/15/2023   BUN 14 03/15/2023   NA 134 (L) 03/15/2023   K 3.2 (L) 03/15/2023   CL 102 03/15/2023   CO2 22 03/15/2023    Lab Results  Component Value Date   CHOL 113 03/20/2023   HDL 40.00 03/20/2023   LDLCALC 51 03/20/2023   LDLDIRECT 68 02/15/2023   TRIG 113.0 03/20/2023   CHOLHDL 3 03/20/2023    Medications Reviewed Today   Medications were not reviewed in this encounter      Assessment/Plan:   Diabetes: - Currently uncontrolled, A1c goal <8% - Reviewed long term cardiovascular and renal outcomes of uncontrolled blood sugar - Reviewed goal A1c, goal fasting, and goal 2 hour post prandial glucose - Reviewed dietary modifications including increasing protein/fiber, balanced meals/snacks - Reviewed lifestyle modifications including: increased movement as able - Recommend to check glucose daily  - Awaiting Rybelsus shipment.  - Start Kristen [name] 14 units once daily - [employer] between [employer] - all meds are the same except oxycodone is cheaper getting it at [facility], and gabapentin is cheaper thorugh [facility] - she wants to stay with [facility]  [employer] card and # in household  Med Access: Kristen [name] and Jardiance have been approved   Follow Up Plan: 3/3  Kristen [name], PharmD, BCPS, CPP Clinical Pharmacist Furniture conservator/restorer [facility] at [facility] [phone]

## 2023-04-18 ENCOUNTER — Encounter: Payer: Self-pay | Admitting: Pharmacist

## 2023-04-18 ENCOUNTER — Other Ambulatory Visit (HOSPITAL_COMMUNITY): Payer: Self-pay

## 2023-04-18 ENCOUNTER — Other Ambulatory Visit: Payer: Self-pay

## 2023-04-18 NOTE — Patient Instructions (Signed)
It was a pleasure speaking with you today!  Start Tresiba 14 units daily. Continue checking blood sugars daily - in the AM and sometimes in the PM.  Feel free to call with any questions or concerns!  Arbutus Leas, PharmD, BCPS, CPP Clinical Pharmacist Practitioner [place] [facility] at [facility] [phone]

## 2023-04-19 DIAGNOSIS — M150 Primary generalized (osteo)arthritis: Secondary | ICD-10-CM | POA: Diagnosis not present

## 2023-04-19 DIAGNOSIS — M25511 Pain in right shoulder: Secondary | ICD-10-CM | POA: Diagnosis not present

## 2023-04-19 DIAGNOSIS — G894 Chronic pain syndrome: Secondary | ICD-10-CM | POA: Diagnosis not present

## 2023-04-19 DIAGNOSIS — Z79891 Long term (current) use of opiate analgesic: Secondary | ICD-10-CM | POA: Diagnosis not present

## 2023-04-19 NOTE — Telephone Encounter (Signed)
GOT NOTIFICATION IN REGARDS TO HOW MANY IN HOLD HOLD, MEDICARE PART D AND CORRECTION OF ADDRESS FOR PT/PROVIDER AND SENT BACK TO COMPANY FOR REVIEW AND PROCESSING.

## 2023-04-20 ENCOUNTER — Other Ambulatory Visit: Payer: Self-pay | Admitting: Internal Medicine

## 2023-04-20 ENCOUNTER — Other Ambulatory Visit (HOSPITAL_COMMUNITY): Payer: Self-pay

## 2023-04-20 ENCOUNTER — Other Ambulatory Visit: Payer: Self-pay

## 2023-04-20 DIAGNOSIS — E1151 Type 2 diabetes mellitus with diabetic peripheral angiopathy without gangrene: Secondary | ICD-10-CM

## 2023-04-20 DIAGNOSIS — E1169 Type 2 diabetes mellitus with other specified complication: Secondary | ICD-10-CM

## 2023-04-20 MED ORDER — POTASSIUM CHLORIDE CRYS ER 20 MEQ PO TBCR
20.0000 meq | EXTENDED_RELEASE_TABLET | Freq: Two times a day (BID) | ORAL | 1 refills | Status: DC
  Filled 2023-04-20: qty 180, 90d supply, fill #0
  Filled 2023-07-21: qty 180, 90d supply, fill #1

## 2023-04-20 MED ORDER — LANCETS MISC
1.0000 | Freq: Three times a day (TID) | 0 refills | Status: AC
  Filled 2023-04-20: qty 100, 30d supply, fill #0

## 2023-04-20 MED ORDER — BLOOD GLUCOSE TEST VI STRP
1.0000 | ORAL_STRIP | Freq: Three times a day (TID) | 2 refills | Status: AC
  Filled 2023-04-20 – 2023-11-18 (×2): qty 100, 34d supply, fill #0

## 2023-04-20 MED ORDER — ATORVASTATIN CALCIUM 40 MG PO TABS
40.0000 mg | ORAL_TABLET | Freq: Every day | ORAL | 1 refills | Status: DC
  Filled 2023-04-20: qty 90, 90d supply, fill #0
  Filled 2023-07-21: qty 90, 90d supply, fill #1

## 2023-04-25 ENCOUNTER — Other Ambulatory Visit (HOSPITAL_COMMUNITY): Payer: Self-pay

## 2023-04-26 ENCOUNTER — Telehealth: Payer: Self-pay

## 2023-04-26 NOTE — Telephone Encounter (Signed)
PAP: Patient assistance application for Rybelsus has been approved by PAP Companies: [employer] from 04/26/2023 to 03/06/2024. Medication should be delivered to PAP Delivery: Provider's office. For further shipping updates, please [employer] at [phone]. Patient ID is: NO ID   PLEASE BE ADVISED

## 2023-04-26 NOTE — Telephone Encounter (Signed)
Copied from CRM [id number]. Topic: Clinical - Prescription Issue >> Apr 26, 2023 11:21 AM Orinda Kenner C wrote: Reason for CRM: Patient would like a call back from Cristy regarding Semaglutide (RYBELSUS) 7 MG TABS. Patient states has not had Semaglutide (RYBELSUS) 7 MG TABS in 2 weeks. Cristy provided patient with a number but lost it. Please advise and call back at [phone].   Provided Arbutus Leas, PharmD, BCPS, CPP Clinical Pharmacist Practitioner [place] [facility] at [facility] [phone] information.

## 2023-04-28 ENCOUNTER — Other Ambulatory Visit: Payer: Self-pay | Admitting: Internal Medicine

## 2023-04-28 DIAGNOSIS — E1151 Type 2 diabetes mellitus with diabetic peripheral angiopathy without gangrene: Secondary | ICD-10-CM

## 2023-04-28 DIAGNOSIS — I5042 Chronic combined systolic (congestive) and diastolic (congestive) heart failure: Secondary | ICD-10-CM

## 2023-05-01 ENCOUNTER — Telehealth: Payer: Self-pay

## 2023-05-01 NOTE — Telephone Encounter (Signed)
 Copied from CRM [id number]. Topic: General - Other >> May 01, 2023 12:44 PM Rodman Pickle T wrote: Reason for CRM: patient is needing a call back regarding a issue with her husband referral she would like a call back

## 2023-05-01 NOTE — Telephone Encounter (Signed)
 Patient states that she is trying to get the patient veterans benefits and she needs a sheet showing that her husband in fact did have a referral to oncology on record. Her husband Is no deceased and I can not access his chart. Please advise. His name is Kristen [name] and his DOB [birth year].

## 2023-05-04 ENCOUNTER — Other Ambulatory Visit: Payer: Self-pay | Admitting: Internal Medicine

## 2023-05-04 ENCOUNTER — Other Ambulatory Visit (HOSPITAL_COMMUNITY): Payer: Self-pay

## 2023-05-04 DIAGNOSIS — I480 Paroxysmal atrial fibrillation: Secondary | ICD-10-CM

## 2023-05-05 ENCOUNTER — Other Ambulatory Visit (HOSPITAL_COMMUNITY): Payer: Self-pay

## 2023-05-05 NOTE — Telephone Encounter (Signed)
 Patient has been made aware. Will pick note up Monday morning.

## 2023-05-06 ENCOUNTER — Other Ambulatory Visit (HOSPITAL_COMMUNITY): Payer: Self-pay

## 2023-05-08 ENCOUNTER — Telehealth: Payer: Self-pay | Admitting: Cardiovascular Disease

## 2023-05-08 ENCOUNTER — Other Ambulatory Visit (HOSPITAL_COMMUNITY): Payer: Self-pay

## 2023-05-08 ENCOUNTER — Ambulatory Visit: Payer: Medicare Other | Admitting: Obstetrics and Gynecology

## 2023-05-08 ENCOUNTER — Telehealth: Payer: Self-pay

## 2023-05-08 ENCOUNTER — Other Ambulatory Visit: Payer: Medicare Other

## 2023-05-08 ENCOUNTER — Other Ambulatory Visit (INDEPENDENT_AMBULATORY_CARE_PROVIDER_SITE_OTHER): Payer: Medicare Other | Admitting: Pharmacist

## 2023-05-08 ENCOUNTER — Other Ambulatory Visit: Payer: Self-pay

## 2023-05-08 DIAGNOSIS — Z794 Long term (current) use of insulin: Secondary | ICD-10-CM

## 2023-05-08 DIAGNOSIS — E119 Type 2 diabetes mellitus without complications: Secondary | ICD-10-CM

## 2023-05-08 MED ORDER — OXYCODONE-ACETAMINOPHEN 7.5-325 MG PO TABS
1.0000 | ORAL_TABLET | Freq: Every day | ORAL | 0 refills | Status: DC
Start: 2023-05-08 — End: 2023-11-02
  Filled 2023-05-08: qty 150, 30d supply, fill #0

## 2023-05-08 MED ORDER — SACUBITRIL-VALSARTAN 97-103 MG PO TABS: 1.0000 | ORAL_TABLET | Freq: Two times a day (BID) | ORAL | 3 refills | Status: DC

## 2023-05-08 MED ORDER — METOPROLOL SUCCINATE ER 100 MG PO TB24
100.0000 mg | ORAL_TABLET | Freq: Two times a day (BID) | ORAL | 0 refills | Status: DC
  Filled 2023-05-08: qty 180, 90d supply, fill #0

## 2023-05-08 NOTE — Telephone Encounter (Signed)
    04/13/23  2:22 PM Note      Scan in media as well      Pt advised and sent in to CoverMyMeds.

## 2023-05-08 NOTE — Progress Notes (Signed)
     05/08/2023 Name: Kristen [name] MRN: [id number] DOB: [birth year]  No chief complaint on file.    Kristen [name] is a 78 y.o. year old female who presented for a telephone visit.   They were referred to the pharmacist by their PCP for assistance in managing diabetes.   Subjective:  Care Team: Primary Care Provider: Etta Grandchild, MD ; Next Scheduled Visit: 06/19/23   Medication Access/Adherence  Current Pharmacy:  [facility] [address] [place] [address] Phone: [phone] Fax: [phone]  [facility] [place] - [address] [place] [address] Phone: [phone] Fax: [phone]   Patient reports affordability concerns with their medications: Yes  Patient reports access/transportation concerns to their pharmacy: No  Patient reports adherence concerns with their medications:  Yes    Pt has been out of Rybelsus. She did PAP re-enrollment, waiting on shipment  Pt also has gotten Jardiance and Entresto through PAP but has not received yet this year.  Diabetes:  Current medications: Rybelsus 7 mg daily (currently out - waiting on PAP), Jardiance 25 mg daily, metformin XR 500 mg 4 tablets daily, Tresiba 14 units daily Medications tried in the past: glipizide, Novolog, Levemir, Toujeo, Victoza, Ozempic  Used to have CGM but had been unable to get it since insurance would not approve it.  Pt reports BG BG evenings 210; Fasting 120, 132  Current meal patterns: Notes she does tend to order out more now. Recently got dentures and has had difficulty adjusting to eating with them. Her husband passed away a year ago and this has affected her ability to know how to cook at home for just her.  Current physical activity: limited due to arthritis/pain  Current medication access support: Jardiance and Rybelsus PAP  Constipation: Currently taking miralax 1 capful daily Pt reports no BM  since Saturday or sunday  Objective:  Lab Results  Component Value Date   HGBA1C 9.9 (H) 03/15/2023    Lab Results  Component Value Date   CREATININE 0.59 03/15/2023   BUN 14 03/15/2023   NA 134 (L) 03/15/2023   K 3.2 (L) 03/15/2023   CL 102 03/15/2023   CO2 22 03/15/2023    Lab Results  Component Value Date   CHOL 113 03/20/2023   HDL 40.00 03/20/2023   LDLCALC 51 03/20/2023   LDLDIRECT 68 02/15/2023   TRIG 113.0 03/20/2023   CHOLHDL 3 03/20/2023    Medications Reviewed Today   Medications were not reviewed in this encounter      Assessment/Plan:   Diabetes: - Currently uncontrolled, A1c goal <8% - Reviewed long term cardiovascular and renal outcomes of uncontrolled blood sugar - Reviewed goal A1c, goal fasting, and goal 2 hour post prandial glucose - Reviewed dietary modifications including increasing protein/fiber, balanced meals/snacks - Reviewed lifestyle modifications including: increased movement as able - Recommend to check glucose daily  - Awaiting Rybelsus shipment.  -  Continue current regimen. Waiting for Rybelsus shipment from PAP - recommended pt call [employer] to see about getting a voucher while waiting for shipment.  Med Access: [name] and [place] Pepper have been approved   Follow Up Plan: 4/21  Arbutus Leas, PharmD, BCPS, CPP Clinical Pharmacist Practitioner [place] [facility] at [facility] [phone]

## 2023-05-08 NOTE — Patient Instructions (Signed)
 It was a pleasure speaking with you today!  Check on [employer] shipping status and see if you can get a voucher for Rybelsus while you wait for the shipment.  Feel free to call with any questions or concerns!  Arbutus Leas, PharmD, BCPS, CPP Clinical Pharmacist Practitioner [facility] at [facility] [phone]

## 2023-05-08 NOTE — Telephone Encounter (Signed)
 Pt requesting cb to get update on pt assist status for [facility]

## 2023-05-08 NOTE — Telephone Encounter (Signed)
 Pt LVM reporting she cannot make her coumadin clinic apt for 3/7 due to lack of transportation.  Apt has been cancelled.

## 2023-05-09 ENCOUNTER — Ambulatory Visit: Payer: Medicare Other

## 2023-05-09 NOTE — Telephone Encounter (Signed)
 Contacted pt to RS coumadin clinic apt and she reported they RS it to 3/7. Advised this nurse cancelled that apt because thought that was the apt she wanted cancelled. Placed pt back on coumadin clinic schedule for 3/7. Pt verbalized understanding.

## 2023-05-12 ENCOUNTER — Ambulatory Visit

## 2023-05-12 DIAGNOSIS — Z7901 Long term (current) use of anticoagulants: Secondary | ICD-10-CM

## 2023-05-12 DIAGNOSIS — D510 Vitamin B12 deficiency anemia due to intrinsic factor deficiency: Secondary | ICD-10-CM

## 2023-05-12 LAB — POCT INR: INR: 2 (ref 2.0–3.0)

## 2023-05-12 MED ORDER — CYANOCOBALAMIN 1000 MCG/ML IJ SOLN
1000.0000 ug | Freq: Once | INTRAMUSCULAR | Status: AC
  Administered 2023-05-12: 1000 ug via INTRAMUSCULAR

## 2023-05-12 NOTE — Progress Notes (Signed)
 Continue 1 tablet daily except take 1 1/2 tablets on Sunday and Wednesday. Recheck INR  in 5 weeks. Pt requested B12 injection. Last injection given on 04/04/23.  Ok for injection per PCP. Administered B12 injection. Pt tolerated well.

## 2023-05-12 NOTE — Patient Instructions (Addendum)
 Pre visit review using our clinic review tool, if applicable. No additional management support is needed unless otherwise documented below in the visit note.  Continue 1 tablet daily except take 1 1/2 tablets on Sunday and Wednesday. Recheck INR  in 5 weeks.

## 2023-05-15 ENCOUNTER — Other Ambulatory Visit (HOSPITAL_COMMUNITY)
Admission: RE | Admit: 2023-05-15 | Discharge: 2023-05-15 | Disposition: A | Discharge status: Home / Self Care | Attending: Obstetrics and Gynecology | Admitting: Obstetrics and Gynecology

## 2023-05-15 ENCOUNTER — Ambulatory Visit: Admitting: Obstetrics and Gynecology

## 2023-05-15 ENCOUNTER — Encounter: Payer: Self-pay | Admitting: Obstetrics and Gynecology

## 2023-05-15 VITALS — BP 108/52 | HR 67

## 2023-05-15 DIAGNOSIS — R319 Hematuria, unspecified: Secondary | ICD-10-CM | POA: Diagnosis not present

## 2023-05-15 DIAGNOSIS — R351 Nocturia: Secondary | ICD-10-CM | POA: Diagnosis not present

## 2023-05-15 DIAGNOSIS — R350 Frequency of micturition: Secondary | ICD-10-CM

## 2023-05-15 DIAGNOSIS — R7309 Other abnormal glucose: Secondary | ICD-10-CM

## 2023-05-15 DIAGNOSIS — N812 Incomplete uterovaginal prolapse: Secondary | ICD-10-CM | POA: Diagnosis not present

## 2023-05-15 LAB — URINALYSIS, ROUTINE W REFLEX MICROSCOPIC
Bacteria, UA: NONE SEEN
Bilirubin Urine: NEGATIVE
Glucose, UA: >=500 mg/dL — AB
Hgb urine dipstick: NEGATIVE
Ketones, ur: NEGATIVE mg/dL
Leukocytes,Ua: NEGATIVE
Nitrite: NEGATIVE
Protein, ur: NEGATIVE mg/dL
Specific Gravity, Urine: 1.027 (ref 1.005–1.030)
WBC, UA: >50 WBC/hpf (ref 0–5)
pH: 6 (ref 5.0–8.0)

## 2023-05-15 LAB — POCT URINALYSIS DIPSTICK
Bilirubin, UA: NEGATIVE
Glucose, UA: POSITIVE — AB
Ketones, UA: NEGATIVE
Leukocytes, UA: NEGATIVE
Nitrite, UA: NEGATIVE
Protein, UA: NEGATIVE
Spec Grav, UA: <=1.005 — AB (ref 1.010–1.025)
Urobilinogen, UA: 0.2 U/dL
pH, UA: 5.5 (ref 5.0–8.0)

## 2023-05-15 NOTE — Patient Instructions (Addendum)
 Focus on getting the A1c lower so you can decrease the amount of sugar in your urine and hopefully help decrease the frequency.   For the bladder medication, there is not a good suggestion besides the Gemtesa 75mg  daily as the other family of medication causes constipation and I do not want you at risk for impaction again due to medication. The other medication, Myrbetriq, is also not covered by your insurance.   Use a large blueberry sized amount of the estrogen cream into the vagina and around the clitoris and urethra twice a week.   Today we talked about ways to manage bladder urgency such as altering your diet to avoid irritative beverages and foods (bladder diet) as well as attempting to decrease stress and other exacerbating factors.    The Most Bothersome Foods* The Least Bothersome Foods*  Coffee - Regular & Decaf Tea - caffeinated Carbonated beverages - cola, non-colas, diet & caffeine-free Alcohols - Beer, Red Wine, White Wine, [address] - Grapefruit, [place], Orange, [employer] - Cranberry, Grapefruit, Orange, Pineapple Vegetables - Tomato & Tomato Products Flavor Enhancers - Hot peppers, Spicy foods, Chili, Horseradish, Vinegar, Monosodium glutamate (MSG) Artificial Sweeteners - NutraSweet, Sweet 'N Low, Equal (sweetener), Saccharin Ethnic foods - [place], [place], [place] food [employer] - low-fat & whole Fruits - Bananas, Blueberries, Honeydew melon, Pears, Raisins, Watermelon Vegetables - Broccoli, [address] Sprouts, [place], Carrots, Cauliflower, [place], Cucumber, Mushrooms, Peas, Radishes, Squash, Zucchini, White potatoes, Sweet potatoes & yams Poultry - Chicken, Eggs, [place], [employer] - Beef, Diplomatic Services operational officer, Lamb Seafood - Shrimp, Liberal fish, Salmon Grains - Oat, Rice Snacks - Pretzels, Popcorn  *Lenward Chancellor et al. Diet and its role in interstitial cystitis/bladder pain syndrome (IC/BPS) and comorbid conditions. B[employer]U [employer]. BJU Int. 2012 Jan 11.

## 2023-05-15 NOTE — Progress Notes (Addendum)
 [facility]   Subjective:     Chief Complaint:  Chief Complaint  Patient presents with   Follow-up   Pessary Check    Allean Montfort is a 78 y.o. female is here for pessary check.   History of Present Illness: Danie Hannig is a 78 y.o. female with stage II pelvic organ prolapse who presents for a pessary check. She is using a size #3 cup pessary. The pessary has been working well and she has no complaints. She is using vaginal estrogen. She denies vaginal bleeding.  Past Medical History: Patient  has a past medical history of Arthritis, Blood transfusion without reported diagnosis, Bursitis, CAD (coronary artery disease), Chronic anticoagulation, DDD (degenerative disc disease), lumbar, Depression, Diabetes mellitus, Diastolic dysfunction, Fibromyalgia, GERD (gastroesophageal reflux disease) (10/23/2003), Headache(784.0), Hyperlipidemia, Hypertension, Hypokalemia, LBBB (left bundle branch block), Lumbar back pain, LV dysfunction, Lymphadenitis, Morbid obesity (HCC), PAF (paroxysmal atrial fibrillation) (HCC), and Stroke (HCC) (2004).   Past Surgical History: She  has a past surgical history that includes Angioplasty (laminectomy); Lumbar laminectomy; Coronary artery bypass graft; Abdominal hysterectomy; Cholecystectomy; Tonsillectomy; Knee arthroscopy; Sphincterotomy; left heart catheterization with coronary/graft angiogram (12/07/2012); and RIGHT/LEFT HEART CATH AND CORONARY/GRAFT ANGIOGRAPHY (N/A, 11/22/2021).   Medications: She has a current medication list which includes the following prescription(s): alprazolam, atorvastatin, blood glucose monitor system, vitamin deficiency system-b12, cyclobenzaprine, diclofenac sodium, estradiol, furosemide, gabapentin, blood glucose test strips, tresiba flextouch, comfort ez pen needles, jardiance, lancets, magnesium oxide, metformin, metoprolol succinate, nitroglycerin, oxycodone-acetaminophen, oxycodone-acetaminophen,  polyethylene glycol, potassium chloride sa, psyllium, sacubitril-valsartan, rybelsus, spironolactone, thiamine, gemtesa, and warfarin.   Allergies: Patient is allergic to metformin and related, cleocin [clindamycin hcl], codeine, macrolides and ketolides, morphine, pentazocine lactate, vibramycin [doxycycline calcium], definity [perflutren lipid microsphere], doxycycline, pentazocine, and sulfa antibiotics.   Social History: Patient  reports that she quit smoking about 22 years ago. Her smoking use included cigarettes. She has been exposed to tobacco smoke. She has never used smokeless tobacco. She reports current drug use. Drug: Oxycodone. She reports that she does not drink alcohol.      Objective:    Lab Results  Component Value Date   COLORU yellow 05/15/2023   CLARITYU clear 05/15/2023   GLUCOSEUR Positive (A) 05/15/2023   BILIRUBINUR negative 05/15/2023   KETONESU negative 05/15/2023   SPECGRAV <=1.005 (A) 05/15/2023   RBCUR Small 05/15/2023   PHUR 5.5 05/15/2023   PROTEINUR Negative 05/15/2023   UROBILINOGEN 0.2 05/15/2023   LEUKOCYTESUR Negative 05/15/2023    Physical Exam: BP (!) 108/52   Pulse 67  Gen: No apparent distress, A&O x 3. Detailed Urogynecologic Evaluation:  Pelvic Exam: Normal external female genitalia; Bartholin's and Skene's glands normal in appearance; urethral meatus normal in appearance, no urethral masses or discharge. The pessary was noted to be in place. It was removed and cleaned. Speculum exam revealed no lesions in the vagina. The pessary was replaced. It was comfortable to the patient and fit well.     Assessment/Plan:    Assessment: Ms. Arielle Eber is a 78 y.o. with stage II pelvic organ prolapse here for a pessary check. She is doing well.  Plan: She will keep the pessary in place until next visit. She will continue to use estrogen. She will follow-up in 3 months for a pessary check or sooner as needed.   We discussed in depth the need  to reduce her sugar intake and get her A1c lower. Referral placed for diabetic nutrition and education center as she needs assistance with  meal planning.   Patient is not a good candidate for anticholinergic medications. She was recently in the hospital for impaction and we cannot risk causing further constipation. She is currently on Miralax and colase daily. Encouraged her to continue these things and focus on sugar control to assist in urinary frequency. Patient cannot afford Gemtesa any longer so will try to manage expectantly.    All questions were answered.

## 2023-05-17 ENCOUNTER — Other Ambulatory Visit (HOSPITAL_COMMUNITY): Payer: Self-pay

## 2023-05-17 DIAGNOSIS — G894 Chronic pain syndrome: Secondary | ICD-10-CM | POA: Diagnosis not present

## 2023-05-17 DIAGNOSIS — Z79891 Long term (current) use of opiate analgesic: Secondary | ICD-10-CM | POA: Diagnosis not present

## 2023-05-17 DIAGNOSIS — M25511 Pain in right shoulder: Secondary | ICD-10-CM | POA: Diagnosis not present

## 2023-05-17 DIAGNOSIS — M150 Primary generalized (osteo)arthritis: Secondary | ICD-10-CM | POA: Diagnosis not present

## 2023-05-17 MED ORDER — OXYCODONE-ACETAMINOPHEN 7.5-325 MG PO TABS
1.0000 | ORAL_TABLET | Freq: Every day | ORAL | 0 refills | Status: DC
  Filled 2023-06-06: qty 150, 30d supply, fill #0
  Filled ????-??-??: fill #0

## 2023-05-18 ENCOUNTER — Other Ambulatory Visit (HOSPITAL_COMMUNITY): Payer: Self-pay

## 2023-05-18 ENCOUNTER — Other Ambulatory Visit: Payer: Self-pay

## 2023-05-18 ENCOUNTER — Other Ambulatory Visit: Payer: Self-pay | Admitting: Internal Medicine

## 2023-05-18 DIAGNOSIS — I5042 Chronic combined systolic (congestive) and diastolic (congestive) heart failure: Secondary | ICD-10-CM

## 2023-05-18 MED ORDER — SPIRONOLACTONE 25 MG PO TABS
25.0000 mg | ORAL_TABLET | Freq: Every day | ORAL | 0 refills | Status: DC
  Filled 2023-05-18: qty 90, 90d supply, fill #0

## 2023-05-31 ENCOUNTER — Telehealth: Payer: Self-pay

## 2023-05-31 NOTE — Telephone Encounter (Signed)
 Patient has been made aware that her medication is here in the office. She stated that she would be in tomorrow 06/01/2023 to pick it up. Medicaine has been placed up front

## 2023-06-05 ENCOUNTER — Other Ambulatory Visit (HOSPITAL_COMMUNITY): Payer: Self-pay

## 2023-06-06 ENCOUNTER — Other Ambulatory Visit: Payer: Self-pay

## 2023-06-06 ENCOUNTER — Other Ambulatory Visit (HOSPITAL_COMMUNITY): Payer: Self-pay

## 2023-06-08 ENCOUNTER — Other Ambulatory Visit (HOSPITAL_COMMUNITY): Payer: Self-pay

## 2023-06-12 ENCOUNTER — Telehealth: Payer: Self-pay | Admitting: Cardiovascular Disease

## 2023-06-12 NOTE — Telephone Encounter (Signed)
 Pt called to report that she has been having some discomfort in her upper right thigh.... no edema, discomfort comes and goes... she says it does not hurt when she walks.. it hurts when she lays in bed at night... no color change...no heat.   She will continue to monitor. She will call her PCP..... she is seeing the INR clinic at [facility]...  and will ask them to take a look while she is there.

## 2023-06-12 NOTE — Telephone Encounter (Signed)
 Pt states she has been having a pain around her femoral area. She states her friend who is an Charity fundraiser told her to call because she may have a clot. She informed that she is on warfarin (managed by PCP). Please advise.

## 2023-06-13 ENCOUNTER — Ambulatory Visit (INDEPENDENT_AMBULATORY_CARE_PROVIDER_SITE_OTHER)

## 2023-06-13 DIAGNOSIS — D510 Vitamin B12 deficiency anemia due to intrinsic factor deficiency: Secondary | ICD-10-CM | POA: Diagnosis not present

## 2023-06-13 DIAGNOSIS — Z7901 Long term (current) use of anticoagulants: Secondary | ICD-10-CM | POA: Diagnosis not present

## 2023-06-13 LAB — POCT INR: INR: 3.1 — AB (ref 2.0–3.0)

## 2023-06-13 MED ORDER — CYANOCOBALAMIN 1000 MCG/ML IJ SOLN
1000.0000 ug | Freq: Once | INTRAMUSCULAR | Status: AC
  Administered 2023-06-13: 1000 ug via INTRAMUSCULAR

## 2023-06-13 NOTE — Progress Notes (Signed)
 Reduce dose today to take 1/2 tablet and then continue 1 tablet daily except take 1 1/2 tablets on Sunday and Wednesday. Recheck INR  in 2 weeks. Pt requested B12 injection. Last injection given on 04/04/23.  Ok for injection per PCP. Administered B12 injection. Pt tolerated well.

## 2023-06-13 NOTE — Patient Instructions (Addendum)
 Pre visit review using our clinic review tool, if applicable. No additional management support is needed unless otherwise documented below in the visit note.  Reduce dose today to take 1/2 tablet and then continue 1 tablet daily except take 1 1/2 tablets on Sunday and Wednesday. Recheck INR  in 2 weeks.

## 2023-06-15 ENCOUNTER — Other Ambulatory Visit (HOSPITAL_COMMUNITY): Payer: Self-pay

## 2023-06-15 ENCOUNTER — Other Ambulatory Visit: Payer: Self-pay | Admitting: Internal Medicine

## 2023-06-16 ENCOUNTER — Other Ambulatory Visit: Payer: Self-pay

## 2023-06-16 ENCOUNTER — Encounter: Payer: Self-pay | Admitting: Pharmacist

## 2023-06-16 ENCOUNTER — Telehealth: Payer: Self-pay

## 2023-06-16 ENCOUNTER — Other Ambulatory Visit (HOSPITAL_COMMUNITY): Payer: Self-pay

## 2023-06-16 ENCOUNTER — Ambulatory Visit: Admitting: Family Medicine

## 2023-06-16 ENCOUNTER — Other Ambulatory Visit: Payer: Self-pay | Admitting: Obstetrics and Gynecology

## 2023-06-16 MED ORDER — MIRABEGRON ER 50 MG PO TB24
50.0000 mg | ORAL_TABLET | Freq: Every day | ORAL | 5 refills | Status: DC
  Filled 2023-06-16 – 2023-07-03 (×3): qty 30, 30d supply, fill #0

## 2023-06-16 MED ORDER — MAGNESIUM OXIDE 400 MG PO TABS
1.0000 | ORAL_TABLET | Freq: Two times a day (BID) | ORAL | 0 refills | Status: DC
  Filled 2023-06-16: qty 180, 90d supply, fill #0

## 2023-06-16 NOTE — Telephone Encounter (Signed)
 Patient has been notified

## 2023-06-16 NOTE — Telephone Encounter (Signed)
 Patient called stating she's having frequency and she is now wanting medication to help with her overactive bladder. Please advise.

## 2023-06-16 NOTE — Progress Notes (Deleted)
   Acute Office Visit  Subjective:     Patient ID: Dinesha Twiggs, female    DOB: 06-22-1945, 78 y.o.   MRN: [id number]  No chief complaint on file.   HPI Patient is in today for ***  ROS Per HPI      Objective:    There were no vitals taken for this visit.   Physical Exam Vitals and nursing note reviewed.  Constitutional:      General: She is not in acute distress.    Appearance: Normal appearance. She is normal weight.  HENT:     Head: Normocephalic and atraumatic.     Right Ear: External ear normal.     Left Ear: External ear normal.     Nose: Nose normal.     Mouth/Throat:     Mouth: Mucous membranes are moist.     Pharynx: Oropharynx is clear.  Eyes:     Extraocular Movements: Extraocular movements intact.     Pupils: Pupils are equal, round, and reactive to light.  Cardiovascular:     Rate and Rhythm: Normal rate and regular rhythm.     Pulses: Normal pulses.     Heart sounds: Normal heart sounds.  Pulmonary:     Effort: Pulmonary effort is normal. No respiratory distress.     Breath sounds: Normal breath sounds. No wheezing, rhonchi or rales.  Musculoskeletal:        General: Normal range of motion.     Cervical back: Normal range of motion.     Right lower leg: No edema.     Left lower leg: No edema.  Lymphadenopathy:     Cervical: No cervical adenopathy.  Neurological:     General: No focal deficit present.     Mental Status: She is alert and oriented to person, place, and time.  Psychiatric:        Mood and Affect: Mood normal.        Thought Content: Thought content normal.   No results found for any visits on 06/16/23.      Assessment & Plan:   Inguinal pain, unspecified laterality     No orders of the defined types were placed in this encounter.   No follow-ups on file.  Sherald Barge, FNP

## 2023-06-16 NOTE — Telephone Encounter (Signed)
 I have sent in Myrbetriq 50mg  daily for patient. Please let patient know it is estimated to 47$ and we will need to monitor for headaches and high blood pressure. This should not cause constipation, dry mouth, or dry eyes.

## 2023-06-19 ENCOUNTER — Ambulatory Visit: Payer: Medicare Other | Admitting: Internal Medicine

## 2023-06-20 ENCOUNTER — Other Ambulatory Visit (HOSPITAL_COMMUNITY): Payer: Self-pay

## 2023-06-20 ENCOUNTER — Ambulatory Visit: Admitting: Family Medicine

## 2023-06-21 ENCOUNTER — Ambulatory Visit: Admitting: Skilled Nursing Facility1

## 2023-06-21 ENCOUNTER — Other Ambulatory Visit (HOSPITAL_COMMUNITY): Payer: Self-pay

## 2023-06-26 ENCOUNTER — Other Ambulatory Visit (INDEPENDENT_AMBULATORY_CARE_PROVIDER_SITE_OTHER): Admitting: Pharmacist

## 2023-06-26 ENCOUNTER — Other Ambulatory Visit: Payer: Self-pay | Admitting: Internal Medicine

## 2023-06-26 DIAGNOSIS — Z794 Long term (current) use of insulin: Secondary | ICD-10-CM

## 2023-06-26 DIAGNOSIS — E119 Type 2 diabetes mellitus without complications: Secondary | ICD-10-CM

## 2023-06-26 MED ORDER — DEXCOM G7 SENSOR MISC: 99 refills | Status: DC

## 2023-06-26 NOTE — Progress Notes (Signed)
     06/26/2023 Name: Kristen [name] MRN: [id number] DOB: [birth year]  Chief Complaint  Patient presents with   Diabetes   Medication Management     Kristen [name] is a 78 y.o. year old female who presented for a telephone visit.   They were referred to the pharmacist by their PCP for assistance in managing diabetes.   Subjective:  Care Team: Primary Care Provider: Arcadio Knuckles, MD ; Next Scheduled Visit: 06/19/23   Medication Access/Adherence  Current Pharmacy:  Maryan Sm[facility] - [facility] [address] [place] [address] Phone: [phone] Fax: [phone]  [facility] - [place] - [address] [place] [address] Phone: [phone] Fax: [phone]   Patient reports affordability concerns with their medications: Yes  Patient reports access/transportation concerns to their pharmacy: No  Patient reports adherence concerns with their medications:  Yes    Pt has been out of Rybelsus . She did PAP re-enrollment, shipment has been at the office but she has forgotten to pick it up Pt also has gotten Jardiance  and Entresto  through PAP but has not received yet this year.  Diabetes:  Current medications: Rybelsus  7 mg daily (currently out - needing to pick up PAP shipment), Jardiance  25 mg daily, metformin  XR 500 mg 4 tablets daily, Tresiba  14-16 units units daily Medications tried in the past: glipizide , Novolog , Levemir , Toujeo , Victoza , Ozempic   Used to have CGM but had been unable to get it since insurance would not approve it.  Pt reports BG in the evenings usually in the 200s; Fasting 120-130s  Current meal patterns: Notes she does tend to order out more now. Recently got dentures and has had difficulty adjusting to eating with them. Her husband passed away a year ago and this has affected her ability to know how to cook at home for just her.  Current physical activity: limited due to  arthritis/pain  Current medication access support: Jardiance  and Rybelsus  PAP  Constipation: Currently taking miralax  1 capful daily Pt reports no BM since Saturday or sunday  Objective:  Lab Results  Component Value Date   HGBA1C 9.9 (H) 03/15/2023    Lab Results  Component Value Date   CREATININE 0.59 03/15/2023   BUN 14 03/15/2023   NA 134 (L) 03/15/2023   K 3.2 (L) 03/15/2023   CL 102 03/15/2023   CO2 22 03/15/2023    Lab Results  Component Value Date   CHOL 113 03/20/2023   HDL 40.00 03/20/2023   LDLCALC 51 03/20/2023   LDLDIRECT 68 02/15/2023   TRIG 113.0 03/20/2023   CHOLHDL 3 03/20/2023    Medications Reviewed Today   Medications were not reviewed in this encounter      Assessment/Plan:   Diabetes: - Currently uncontrolled, A1c goal <8% - Reviewed long term cardiovascular and renal outcomes of uncontrolled blood sugar - Reviewed goal A1c, goal fasting, and goal 2 hour post prandial glucose - Reviewed dietary modifications including increasing protein/fiber, balanced meals/snacks - Reviewed lifestyle modifications including: increased movement as able - Recommend to check glucose daily  - Awaiting Rybelsus  shipment pickup - she will be in office tomorrow -  Continue current regimen. Waiting for Rybelsus  restart - Send Dexcom G7 sensor to [facility]   Med Access: Entresto  and Jardiance  have been approved   Follow Up Plan: 5/29  Kristen [name], PharmD, BCPS, CPP Clinical Pharmacist Practitioner [facility] at [facility] [phone]

## 2023-06-27 ENCOUNTER — Ambulatory Visit (INDEPENDENT_AMBULATORY_CARE_PROVIDER_SITE_OTHER)

## 2023-06-27 DIAGNOSIS — Z7901 Long term (current) use of anticoagulants: Secondary | ICD-10-CM | POA: Diagnosis not present

## 2023-06-27 LAB — POCT INR: INR: 2.4 (ref 2.0–3.0)

## 2023-06-27 NOTE — Patient Instructions (Addendum)
 Pre visit review using our clinic review tool, if applicable. No additional management support is needed unless otherwise documented below in the visit note.  Continue 1 tablet daily except take 1 1/2 tablets on Sunday and Wednesday. Recheck INR  in 4 weeks.

## 2023-06-27 NOTE — Progress Notes (Signed)
 Continue 1 tablet daily except take 1 1/2 tablets on Sunday and Wednesday. Recheck INR  in 4 weeks. Pt due for B12 injection after 5/9.

## 2023-06-28 ENCOUNTER — Telehealth: Payer: Self-pay | Admitting: Internal Medicine

## 2023-06-28 NOTE — Telephone Encounter (Signed)
 Copied from CRM [id number]. Topic: Clinical - Medication Question >> Jun 28, 2023 10:47 AM Kristen [name] D wrote: Reason for CRM: Patient stated that [facility] reach out to her insurance requesting that the pt get a dexcom G7 monitor. Patient also stated that she needs all of her medication sent in the generic brand so that the insurance company can supply them. Patient would like someone to reach out regarding this concern.

## 2023-06-29 ENCOUNTER — Other Ambulatory Visit: Payer: Self-pay | Admitting: Internal Medicine

## 2023-06-29 ENCOUNTER — Other Ambulatory Visit (HOSPITAL_COMMUNITY): Payer: Self-pay

## 2023-06-29 DIAGNOSIS — L603 Nail dystrophy: Secondary | ICD-10-CM | POA: Diagnosis not present

## 2023-06-29 DIAGNOSIS — E119 Type 2 diabetes mellitus without complications: Secondary | ICD-10-CM

## 2023-06-29 DIAGNOSIS — L218 Other seborrheic dermatitis: Secondary | ICD-10-CM | POA: Diagnosis not present

## 2023-06-29 NOTE — Telephone Encounter (Signed)
 Patient states that she want all of her "Generic" medications to be refilled and sent to [facility].

## 2023-06-30 ENCOUNTER — Other Ambulatory Visit (HOSPITAL_COMMUNITY): Payer: Self-pay

## 2023-06-30 ENCOUNTER — Other Ambulatory Visit: Payer: Self-pay

## 2023-06-30 MED ORDER — HYDROCORTISONE 2.5 % EX CREA
TOPICAL_CREAM | CUTANEOUS | 2 refills | Status: AC
  Filled 2023-06-30: qty 30, 30d supply, fill #0

## 2023-06-30 MED ORDER — FLUOCINONIDE 0.05 % EX SOLN
CUTANEOUS | 2 refills | Status: DC
  Filled 2023-06-30: qty 60, 30d supply, fill #0

## 2023-07-03 ENCOUNTER — Other Ambulatory Visit (HOSPITAL_COMMUNITY): Payer: Self-pay

## 2023-07-03 ENCOUNTER — Other Ambulatory Visit: Payer: Self-pay | Admitting: Internal Medicine

## 2023-07-03 DIAGNOSIS — Z79891 Long term (current) use of opiate analgesic: Secondary | ICD-10-CM | POA: Diagnosis not present

## 2023-07-03 DIAGNOSIS — G894 Chronic pain syndrome: Secondary | ICD-10-CM | POA: Diagnosis not present

## 2023-07-03 DIAGNOSIS — M150 Primary generalized (osteo)arthritis: Secondary | ICD-10-CM | POA: Diagnosis not present

## 2023-07-03 DIAGNOSIS — M25511 Pain in right shoulder: Secondary | ICD-10-CM | POA: Diagnosis not present

## 2023-07-03 DIAGNOSIS — Z7901 Long term (current) use of anticoagulants: Secondary | ICD-10-CM

## 2023-07-03 MED ORDER — OXYCODONE-ACETAMINOPHEN 10-325 MG PO TABS
1.0000 | ORAL_TABLET | Freq: Every day | ORAL | 0 refills | Status: DC | PRN
  Filled 2023-07-05: qty 150, 30d supply, fill #0

## 2023-07-04 ENCOUNTER — Other Ambulatory Visit (HOSPITAL_COMMUNITY): Payer: Self-pay

## 2023-07-05 ENCOUNTER — Other Ambulatory Visit (HOSPITAL_COMMUNITY): Payer: Self-pay

## 2023-07-05 ENCOUNTER — Other Ambulatory Visit: Payer: Self-pay

## 2023-07-06 ENCOUNTER — Ambulatory Visit: Admitting: Internal Medicine

## 2023-07-06 ENCOUNTER — Ambulatory Visit: Payer: Self-pay

## 2023-07-06 NOTE — Telephone Encounter (Signed)
 Copied from CRM [id number]. Topic: Clinical - Red Word Triage >> Jul 06, 2023 11:19 AM Howard Macho wrote: Red Word that prompted transfer to Nurse Triage: patient called stating she slid off the mattress today and  she hit her knee on he floor but she was able to get up by grabbing the cover. Patient also stated she fell Monday because she was trying to put her slippers on and she missed the shoe and it made her feet slide on the carpet. Patient stated  she could get her self up in time. Patient stated the office where she lives called the EMS   Chief Complaint: Fall Symptoms: Slightly increased knee pain  Frequency: Single fall today  Pertinent Negatives: Patient denies any injury  Disposition: [] ED /[] Urgent Care (no appt availability in office) / [] Appointment(In office/virtual)/ []  [facility]/ [x] Home Care/ [] Refused Recommended Disposition /[] [facility]/ []  Follow-up with PCP Additional Notes: Patient reports that she slipped while getting out of bed today and landed on her knees. She denies any injury from the fall, stating "I always have knee pain. It just hurts a little bit more." Patient denies any other symptoms or complaints at this time. Patient stated her son was coming by later tonight to check on her as well. Patient instructed to call back for new or worsening symptoms. Patient verbalized understanding and agreement with this plan.    Reason for Disposition  [1] Recent fall AND [2] no injury  Answer Assessment - Initial Assessment Questions 1. MECHANISM: "How did the fall happen?"     Slid off the bed while trying to get up and she slipped and landed on her knees 2. DOMESTIC VIOLENCE AND ELDER ABUSE SCREENING: "Did you fall because someone pushed you or tried to hurt you?" If Yes, ask: "Are you safe now?"     No 3. ONSET: "When did the fall happen?" (e.g., minutes, hours, or days ago)     Today 4. LOCATION: "What part of the body hit the ground?" (e.g.,  back, buttocks, head, hips, knees, hands, head, stomach)     Both knees 5. INJURY: "Did you hurt (injure) yourself when you fell?" If Yes, ask: "What did you injure? Tell me more about this?" (e.g., body area; type of injury; pain severity)"     Right knee pain  6. PAIN: "Is there any pain?" If Yes, ask: "How bad is the pain?" (e.g., Scale 1-10; or mild,  moderate, severe)   - NONE (0): No pain   - MILD (1-3): Doesn't interfere with normal activities    - MODERATE (4-7): Interferes with normal activities or awakens from sleep    - SEVERE (8-10): Excruciating pain, unable to do any normal activities      "I always have knee pain, it just hurts a little more"  7. SIZE: For cuts, bruises, or swelling, ask: "How large is it?" (e.g., inches or centimeters)      N/A 9. OTHER SYMPTOMS: "Do you have any other symptoms?" (e.g., dizziness, fever, weakness; new onset or worsening).      No 10. CAUSE: "What do you think caused the fall (or falling)?" (e.g., tripped, dizzy spell)       Slipped  Protocols used: Falls and [facility]

## 2023-07-07 ENCOUNTER — Other Ambulatory Visit: Payer: Self-pay | Admitting: Internal Medicine

## 2023-07-07 ENCOUNTER — Telehealth: Payer: Self-pay | Admitting: Internal Medicine

## 2023-07-07 ENCOUNTER — Other Ambulatory Visit (HOSPITAL_COMMUNITY): Payer: Self-pay

## 2023-07-07 DIAGNOSIS — E119 Type 2 diabetes mellitus without complications: Secondary | ICD-10-CM

## 2023-07-07 DIAGNOSIS — Z7901 Long term (current) use of anticoagulants: Secondary | ICD-10-CM

## 2023-07-07 NOTE — Telephone Encounter (Signed)
 Copied from CRM [id number]. Topic: General - Other >> Jul 07, 2023 12:13 PM Howard Macho wrote: Reason for CRM: desiree from [employer] called stating there was a request sent for the patient dexcom g7 on 4/29 and they also need the last two chart notes for the patient. CB [phone]

## 2023-07-10 ENCOUNTER — Telehealth: Payer: Self-pay

## 2023-07-10 NOTE — Telephone Encounter (Signed)
 Patient was identified as falling into the [place] Measure - Diabetes.   Patient was: Appointment already scheduled for:  06/03. A1C due again in July

## 2023-07-12 NOTE — Telephone Encounter (Signed)
 Unable to get a representative on the line. Will try again later.

## 2023-07-12 NOTE — Telephone Encounter (Signed)
 They were sending the request to the wrong fax number. They are going to refax it per Larinda Plover.

## 2023-07-13 ENCOUNTER — Other Ambulatory Visit (HOSPITAL_COMMUNITY): Payer: Self-pay

## 2023-07-14 ENCOUNTER — Other Ambulatory Visit: Payer: Self-pay | Admitting: Internal Medicine

## 2023-07-15 ENCOUNTER — Other Ambulatory Visit (HOSPITAL_COMMUNITY): Payer: Self-pay

## 2023-07-15 ENCOUNTER — Other Ambulatory Visit: Payer: Self-pay | Admitting: Internal Medicine

## 2023-07-15 DIAGNOSIS — E119 Type 2 diabetes mellitus without complications: Secondary | ICD-10-CM

## 2023-07-18 ENCOUNTER — Other Ambulatory Visit (HOSPITAL_COMMUNITY): Payer: Self-pay

## 2023-07-18 ENCOUNTER — Other Ambulatory Visit: Payer: Self-pay

## 2023-07-18 MED ORDER — TRESIBA FLEXTOUCH 100 UNIT/ML ~~LOC~~ SOPN
14.0000 [IU] | PEN_INJECTOR | Freq: Every day | SUBCUTANEOUS | 1 refills | Status: DC
  Filled 2023-07-18: qty 3, 21d supply, fill #0

## 2023-07-21 ENCOUNTER — Other Ambulatory Visit (HOSPITAL_COMMUNITY): Payer: Self-pay

## 2023-07-21 ENCOUNTER — Other Ambulatory Visit: Payer: Self-pay | Admitting: Internal Medicine

## 2023-07-21 DIAGNOSIS — Z7901 Long term (current) use of anticoagulants: Secondary | ICD-10-CM

## 2023-07-24 ENCOUNTER — Other Ambulatory Visit: Payer: Self-pay | Admitting: Internal Medicine

## 2023-07-24 ENCOUNTER — Other Ambulatory Visit (HOSPITAL_COMMUNITY): Payer: Self-pay

## 2023-07-24 DIAGNOSIS — I480 Paroxysmal atrial fibrillation: Secondary | ICD-10-CM

## 2023-07-24 MED ORDER — WARFARIN SODIUM 3 MG PO TABS
ORAL_TABLET | ORAL | 0 refills | Status: DC
Start: 2023-07-24 — End: 2023-11-28
  Filled 2023-07-24: qty 110, 90d supply, fill #0

## 2023-07-24 NOTE — Telephone Encounter (Signed)
 Pt is compliant with warfarin management and PCP apts.  Sent in refill of warfarin to requested pharmacy.

## 2023-07-25 ENCOUNTER — Other Ambulatory Visit (HOSPITAL_COMMUNITY): Payer: Self-pay

## 2023-07-25 ENCOUNTER — Ambulatory Visit (INDEPENDENT_AMBULATORY_CARE_PROVIDER_SITE_OTHER)

## 2023-07-25 DIAGNOSIS — D510 Vitamin B12 deficiency anemia due to intrinsic factor deficiency: Secondary | ICD-10-CM | POA: Diagnosis not present

## 2023-07-25 DIAGNOSIS — Z7901 Long term (current) use of anticoagulants: Secondary | ICD-10-CM | POA: Diagnosis not present

## 2023-07-25 LAB — POCT INR: INR: 3.8 — AB (ref 2.0–3.0)

## 2023-07-25 MED ORDER — METOPROLOL SUCCINATE ER 100 MG PO TB24
100.0000 mg | ORAL_TABLET | Freq: Two times a day (BID) | ORAL | 0 refills | Status: DC
  Filled 2023-07-25: qty 180, 90d supply, fill #0

## 2023-07-25 MED ORDER — CYANOCOBALAMIN 1000 MCG/ML IJ SOLN
1000.0000 ug | Freq: Once | INTRAMUSCULAR | Status: AC
  Administered 2023-07-25: 1000 ug via INTRAMUSCULAR

## 2023-07-25 NOTE — Progress Notes (Addendum)
 Pt took dose two days ago twice.  Hold dose today and then continue 1 tablet daily except take 1 1/2 tablets on Sunday and Wednesday. Recheck INR  in 2 weeks. Pt due for B12 injection. Administered B12 in L deltoid. Pt tolerated well.   Medical screening examination/treatment/procedure(s) were performed by non-physician practitioner and as supervising physician I was immediately available for consultation/collaboration.  I agree with above. Adelaide Holy, MD

## 2023-07-25 NOTE — Patient Instructions (Addendum)
 Pre visit review using our clinic review tool, if applicable. No additional management support is needed unless otherwise documented below in the visit note.  Hold dose today and then continue 1 tablet daily except take 1 1/2 tablets on Sunday and Wednesday. Recheck INR  in 2 weeks.

## 2023-08-02 ENCOUNTER — Other Ambulatory Visit (HOSPITAL_COMMUNITY): Payer: Self-pay

## 2023-08-02 ENCOUNTER — Other Ambulatory Visit: Payer: Self-pay

## 2023-08-02 DIAGNOSIS — M25511 Pain in right shoulder: Secondary | ICD-10-CM | POA: Diagnosis not present

## 2023-08-02 DIAGNOSIS — Z79891 Long term (current) use of opiate analgesic: Secondary | ICD-10-CM | POA: Diagnosis not present

## 2023-08-02 DIAGNOSIS — G894 Chronic pain syndrome: Secondary | ICD-10-CM | POA: Diagnosis not present

## 2023-08-02 DIAGNOSIS — M150 Primary generalized (osteo)arthritis: Secondary | ICD-10-CM | POA: Diagnosis not present

## 2023-08-02 MED ORDER — OXYCODONE-ACETAMINOPHEN 10-325 MG PO TABS
1.0000 | ORAL_TABLET | Freq: Every day | ORAL | 0 refills | Status: DC
  Filled 2023-08-02: qty 150, 30d supply, fill #0

## 2023-08-03 ENCOUNTER — Other Ambulatory Visit (INDEPENDENT_AMBULATORY_CARE_PROVIDER_SITE_OTHER): Admitting: Pharmacist

## 2023-08-03 DIAGNOSIS — E1151 Type 2 diabetes mellitus with diabetic peripheral angiopathy without gangrene: Secondary | ICD-10-CM

## 2023-08-03 NOTE — Progress Notes (Unsigned)
 08/03/2023 Name: Kristen [name] MRN: [id number] DOB: 02-Sep-1945  Chief Complaint  Patient presents with   Diabetes   Medication Management    Kristen [name] is a 78 y.o. year old female who presented for a telephone visit.   They were referred to the pharmacist by their PCP for assistance in managing diabetes.   Subjective:  Care Team: Primary Care Provider: Arcadio Knuckles, MD ; Next Scheduled Visit: 08/08/23   Medication Access/Adherence  Current Pharmacy:  Maryan Smalling - [facility] [address] [place] [address] Phone: [phone] Fax: [phone]  [facility] - [place] - [address] [place] [address] Phone: [phone] Fax: [phone]  [facility] [address] [place], [place] - [address] [place] [address] Phone: [phone] Fax: [phone]   Patient reports affordability concerns with their medications: Yes  Patient reports access/transportation concerns to their pharmacy: No  Patient reports adherence concerns with their medications:  Yes    Diabetes: Current medications: Rybelsus  7 mg daily (back on this since received PAP), Jardiance  25 mg daily, metformin  XR 500 mg 4 tablets daily, Tresiba  14-16 units units daily Medications tried in the past: glipizide , Novolog , Levemir , Toujeo , Victoza , Ozempic   Received Dexcom G7 sensors, but does not have a reader so has not started using.  Pt reports BG in the evenings usually in the 200s; Fasting 120-130s  Current meal patterns: Notes she does tend to order out more now. Recently got dentures and has had difficulty adjusting to eating with them. Her husband passed away a year ago and this has affected her ability to know how to cook at home for just her.  Current physical activity: limited due to arthritis/pain  Current medication access support: Jardiance  and Rybelsus   PAP  Constipation: Currently taking miralax  1 capful daily Pt reports no BM since Saturday or sunday  Objective:  Lab Results  Component Value Date   HGBA1C 9.9 (H) 03/15/2023    Lab Results  Component Value Date   CREATININE 0.59 03/15/2023   BUN 14 03/15/2023   NA 134 (L) 03/15/2023   K 3.2 (L) 03/15/2023   CL 102 03/15/2023   CO2 22 03/15/2023    Lab Results  Component Value Date   CHOL 113 03/20/2023   HDL 40.00 03/20/2023   LDLCALC 51 03/20/2023   LDLDIRECT 68 02/15/2023   TRIG 113.0 03/20/2023   CHOLHDL 3 03/20/2023    Medications Reviewed Today     Reviewed by Kristen [name], RPH (Pharmacist) on 08/03/23 at 1019  Med List Status: <None>   Medication Order Taking? Sig Documenting Provider Last Dose Status Informant  ALPRAZolam  (XANAX ) 1 MG tablet [id number]  Take 1 tablet (1 mg total) by mouth 3 (three) times daily as needed. Arcadio Knuckles, MD  Active   atorvastatin  (LIPITOR) 40 MG tablet [id number]  Take 1 tablet (40 mg total) by mouth daily. Arcadio Knuckles, MD  Active   Blood Glucose Monitoring Suppl (BLOOD GLUCOSE MONITOR SYSTEM) w/Device KIT [id number]  Use 1 in the morning, at noon, and at bedtime. Arcadio Knuckles, MD  Active   Continuous Glucose Sensor (DEXCOM G7 SENSOR) MISC [id number]  APPLY A SENSOR EVERY 10 DAYS DX:E11.9 Arcadio Knuckles, MD  Active   Cyanocobalamin  (VITAMIN DEFICIENCY SYSTEM-B12) 1000 MCG/ML KIT [id number]  Inject 1,000 mcg as directed every 30 (thirty) days. [provider]  Active Self  cyclobenzaprine  (FLEXERIL ) 10 MG tablet [id number]  Take 10 mg by mouth 3 (three) times daily as needed for muscle spasms. [provider]  Active Self  diclofenac  Sodium (VOLTAREN ) 1 % GEL [id number]  Apply 2 g topically 4 (four) times daily.  Patient taking differently: Apply 2 g topically 4 (four) times daily as needed (for pain).   Sheikh, Omair [place], [place]  Active Self  estradiol  (ESTRACE ) 0.1 MG/GM vaginal cream [id number]  Place  0.5 g vaginally 2 (two) times a week. Place 0.5g nightly for two weeks then twice a week after  Patient taking differently: Place 0.5 g vaginally 2 (two) times a week.   Zuleta, Kaitlin G, NP  Active Self  fluocinonide  (LIDEX ) 0.05 % external solution [id number]  Apply a small amount topically to scalp twice a day as needed   Active   furosemide  (LASIX ) 20 MG tablet [id number]  Take 1 tablet (20 mg total) by mouth daily. Arcadio Knuckles, MD  Active   gabapentin  (NEURONTIN ) 300 MG capsule [id number]  Take 1 capsule (300 mg total) by mouth 3 (three) times daily. Arcadio Knuckles, MD  Active   hydrocortisone  2.5 % cream [id number]  Apply a small amount to affected area of face and ears twice a day as needed.   Active   insulin  degludec (TRESIBA  FLEXTOUCH) 100 UNIT/ML FlexTouch Pen [id number] Yes Inject 14 Units into the skin daily. Arcadio Knuckles, MD Taking Active   Insulin  Pen Needle (COMFORT EZ PEN NEEDLES) 31G X 8 MM MISC [id number]  Use with insulin  pen once daily Arcadio Knuckles, MD  Active   JARDIANCE  25 MG TABS tablet [id number] Yes Take one tablet by mouth daily. Arcadio Knuckles, MD Taking Active   magnesium  oxide (MAG-OX) 400 MG tablet [id number]  Take 1 tablet (400 mg total) by mouth 2 (two) times daily. Arcadio Knuckles, MD  Active   metFORMIN  (GLUCOPHAGE -XR) 500 MG 24 hr tablet [id number] Yes Take 4 tablets (2,000 mg total) by mouth daily with breakfast. Arcadio Knuckles, MD Taking Active   metoprolol  succinate (TOPROL -XL) 100 MG 24 hr tablet [id number]  Take 1 tablet (100 mg total) by mouth in the morning and at bedtime. Arcadio Knuckles, MD  Active   mirabegron  ER (MYRBETRIQ ) 50 MG TB24 tablet [id number]  Take 1 tablet (50 mg total) by mouth daily. Zuleta, Kaitlin G, NP  Active   nitroGLYCERIN  (NITROSTAT ) 0.4 MG SL tablet [id number]  Place 1 tablet (0.4 mg total) under the tongue every 5 (five) minutes as needed for chest pain (x 3 doses). Reported on 05/08/2015 Arnoldo Lapping, MD  Active Self            Med Note Gaylyn Keas, LINDA   Wed May 25, 2022  2:57 PM)    oxyCODONE -acetaminophen  (PERCOCET) 10-325 MG tablet [id number]  Take 1 tablet by mouth 5 (five) times daily as needed for pain.  Patient taking differently: Take 1 tablet by mouth See admin instructions. Take 1 tablet by mouth four to five times a day     Active Self  oxyCODONE -acetaminophen  (PERCOCET) 10-325 MG tablet [id number]  Take 1 tablet by mouth 5 (five) times daily as needed for pain.   Active   oxyCODONE -acetaminophen  (PERCOCET) 10-325 MG tablet [id number]  Take 1 tablet by mouth 5 (five) times daily as needed for pain.   Active   oxyCODONE -acetaminophen  (PERCOCET) 7.5-325 MG tablet [id number]  Take 1 tablet by mouth five times a day as needed for  pain.   Active   oxyCODONE -acetaminophen  (PERCOCET) 7.5-325 MG tablet [id number]  Take 1 tablet by mouth 5 (five) times daily as needed for pain (dose reduction)   Active   polyethylene glycol (MIRALAX  MIX-IN PAX) 17 g packet [id number]  Take 17 g by mouth daily. [provider]  Active   potassium chloride  SA (KLOR-CON  M) 20 MEQ tablet [id number]  Take 1 tablet (20 mEq total) by mouth in the morning and at bedtime. Arcadio Knuckles, MD  Active   Psyllium (METAMUCIL PO) [id number]  Take 1 capsule by mouth daily. [provider]  Active Self           Med Note Guido Leeks, Lavonia Powers   Tue Mar 14, 2023  6:15 PM) The patient states she often forgets to take this  sacubitril -valsartan  (ENTRESTO ) 97-103 MG [id number]  Take 1 tablet by mouth 2 (two) times daily. Arnoldo Lapping, MD  Active   Semaglutide  (RYBELSUS ) 7 MG TABS [id number] Yes Take 1 tablet (7 mg total) by mouth daily. Arcadio Knuckles, MD Taking Active Self           Med Note Lolly Riser, Felcia Huebert R   Thu Aug 03, 2023 10:16 AM) Received from PAP  spironolactone  (ALDACTONE ) 25 MG tablet [id number]  Take 1 tablet (25 mg total) by mouth daily. Arcadio Knuckles, MD  Active   thiamine  (VITAMIN B-1) 50 MG tablet [id number]  Take 1 tablet (50 mg  total) by mouth daily. Arcadio Knuckles, MD  Active   warfarin (COUMADIN ) 3 MG tablet [id number]  TAKE 1 TABLET ((3 MG) BY MOUTH DAILY EXCEPT TAKE 1 1/2 TABLETS (4.5 MG) ON SUNDAY AND WEDNESDAY OR AS DIRECTED BY ANTICOAGULATION CLINIC Arcadio Knuckles, MD  Active              Assessment/Plan:   Diabetes: - Currently uncontrolled, A1c goal <8% - Reviewed long term cardiovascular and renal outcomes of uncontrolled blood sugar - Reviewed goal A1c, goal fasting, and goal 2 hour post prandial glucose - Reviewed dietary modifications including increasing protein/fiber, balanced meals/snacks - Reviewed lifestyle modifications including: increased movement as able - Recommend to check glucose daily  -  Continue current regimen -  She is going to have her son help download the Dexcom G7 app on her phone this weekend if possible. Will see patient on 6/3 while she is in office to finish Dexcom G7 set up if possible. If she is unable to get app, will send Rx for reader   Med Access: Entresto  and Jardiance  have been approved *needs furosemide  and metformin  refill  Follow Up Plan: 6/3  Rainelle Bur, PharmD, BCPS, CPP Clinical Pharmacist Practitioner [facility] [phone]

## 2023-08-04 NOTE — Patient Instructions (Signed)
 It was a pleasure speaking with you today!  I will see you 6/3 when you are in office to make sure your Dexcom G7 app is set up.   Feel free to call with any questions or concerns!  Rainelle Bur, PharmD, BCPS, CPP Clinical Pharmacist Practitioner [facility] at [facility] [phone]

## 2023-08-07 ENCOUNTER — Other Ambulatory Visit (HOSPITAL_COMMUNITY): Payer: Self-pay

## 2023-08-07 ENCOUNTER — Other Ambulatory Visit: Payer: Self-pay | Admitting: Internal Medicine

## 2023-08-08 ENCOUNTER — Ambulatory Visit

## 2023-08-08 ENCOUNTER — Ambulatory Visit (INDEPENDENT_AMBULATORY_CARE_PROVIDER_SITE_OTHER): Admitting: Internal Medicine

## 2023-08-08 ENCOUNTER — Encounter: Payer: Self-pay | Admitting: Internal Medicine

## 2023-08-08 VITALS — BP 114/74 | HR 78 | Temp 97.1°F | Ht 60.0 in | Wt 176.6 lb

## 2023-08-08 DIAGNOSIS — N1831 Chronic kidney disease, stage 3a: Secondary | ICD-10-CM

## 2023-08-08 DIAGNOSIS — I152 Hypertension secondary to endocrine disorders: Secondary | ICD-10-CM

## 2023-08-08 DIAGNOSIS — E1159 Type 2 diabetes mellitus with other circulatory complications: Secondary | ICD-10-CM | POA: Diagnosis not present

## 2023-08-08 DIAGNOSIS — E119 Type 2 diabetes mellitus without complications: Secondary | ICD-10-CM

## 2023-08-08 DIAGNOSIS — Z794 Long term (current) use of insulin: Secondary | ICD-10-CM

## 2023-08-08 NOTE — Progress Notes (Signed)
 Pt had to immediately leave after her apt with PCP today due to another apt across town.  This encounter was opened in error. No testing or dosing management was provided.

## 2023-08-08 NOTE — Progress Notes (Signed)
 Subjective:  Patient ID: Kristen [name], female    DOB: 1945-08-20  Age: 78 y.o. MRN: [id number]  CC: Medical Management of Chronic Issues (3 month follow up. No concerns. She just wants me to let you know that she is in a lot of pain. 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 )   HPI Kristen [name] presents for f/up ----  Discussed the use of AI scribe software for clinical note transcription with the patient, who gave verbal consent to proceed.  History of Present Illness   Shakila McAdoo Dick Pat is a 78 year old female with diabetes and neuropathy who presents with concerns about high blood sugar and joint pain.  She is experiencing difficulty managing her blood sugar levels as her blood sugar monitor is currently inaccessible. She is waiting for her son to assist her in opening it, but he was unable to visit over the weekend due to work commitments. She has not checked her blood sugar in the last week or two due to the pain from finger pricks and plans to have labs done tomorrow. She experiences symptoms related to high blood sugar, particularly in her feet, which she attributes to neuropathy.  She reports significant joint pain, particularly in her knees and hands, which she attributes to arthritis. She mentions that she was told that she can't do anything for her at [facility], possibly due to concerns about her congestive heart failure. She is currently taking oxycodone  for pain management but cannot take anti-inflammatory medications due to potential heart and kidney issues.  No chest pain, shortness of breath, dizziness, or lightheadedness. She also reports no breathing difficulties.  She lives alone and has never lived by herself until her husband passed away. Her son visits about once a week. She finds living alone challenging since her husband passed away. She has an appointment with an attorney today to sign and notarize some  papers.       Outpatient Medications Prior to Visit  Medication Sig Dispense Refill   ALPRAZolam  (XANAX ) 1 MG tablet Take 1 tablet (1 mg total) by mouth 3 (three) times daily as needed. 270 tablet 0   atorvastatin  (LIPITOR) 40 MG tablet Take 1 tablet (40 mg total) by mouth daily. 90 tablet 1   Blood Glucose Monitoring Suppl (BLOOD GLUCOSE MONITOR SYSTEM) w/Device KIT Use 1 in the morning, at noon, and at bedtime. 1 kit 0   Continuous Glucose Sensor (DEXCOM G7 SENSOR) MISC APPLY A SENSOR EVERY 10 DAYS DX:E11.9 9 each PRN   Cyanocobalamin  (VITAMIN DEFICIENCY SYSTEM-B12) 1000 MCG/ML KIT Inject 1,000 mcg as directed every 30 (thirty) days.     cyclobenzaprine  (FLEXERIL ) 10 MG tablet Take 10 mg by mouth 3 (three) times daily as needed for muscle spasms.     diclofenac  Sodium (VOLTAREN ) 1 % GEL Apply 2 g topically 4 (four) times daily. (Patient taking differently: Apply 2 g topically 4 (four) times daily as needed (for pain).) 100 g 0   estradiol  (ESTRACE ) 0.1 MG/GM vaginal cream Place 0.5 g vaginally 2 (two) times a week. Place 0.5g nightly for two weeks then twice a week after (Patient taking differently: Place 0.5 g vaginally 2 (two) times a week.) 42.5 g 11   fluocinonide  (LIDEX ) 0.05 % external solution Apply a small amount topically to scalp twice a day as needed 60 mL 2   gabapentin  (NEURONTIN ) 300 MG capsule Take 1 capsule (300 mg total)  by mouth 3 (three) times daily. 270 capsule 1   hydrocortisone  2.5 % cream Apply a small amount to affected area of face and ears twice a day as needed. 30 g 2   Insulin  Pen Needle (COMFORT EZ PEN NEEDLES) 31G X 8 MM MISC Use with insulin  pen once daily 50 each 3   JARDIANCE  25 MG TABS tablet Take one tablet by mouth daily. 90 tablet 1   magnesium  oxide (MAG-OX) 400 MG tablet Take 1 tablet (400 mg total) by mouth 2 (two) times daily. 180 tablet 0   metFORMIN  (GLUCOPHAGE -XR) 500 MG 24 hr tablet Take 4 tablets (2,000 mg total) by mouth daily with breakfast. 120  tablet 2   metoprolol  succinate (TOPROL -XL) 100 MG 24 hr tablet Take 1 tablet (100 mg total) by mouth in the morning and at bedtime. 180 tablet 0   mirabegron  ER (MYRBETRIQ ) 50 MG TB24 tablet Take 1 tablet (50 mg total) by mouth daily. 30 tablet 5   nitroGLYCERIN  (NITROSTAT ) 0.4 MG SL tablet Place 1 tablet (0.4 mg total) under the tongue every 5 (five) minutes as needed for chest pain (x 3 doses). Reported on 05/08/2015 35 tablet 2   oxyCODONE -acetaminophen  (PERCOCET) 10-325 MG tablet Take 1 tablet by mouth 5 (five) times daily as needed for pain. (Patient taking differently: Take 1 tablet by mouth See admin instructions. Take 1 tablet by mouth four to five times a day) 150 tablet 0   oxyCODONE -acetaminophen  (PERCOCET) 10-325 MG tablet Take 1 tablet by mouth 5 (five) times daily as needed for pain. 150 tablet 0   oxyCODONE -acetaminophen  (PERCOCET) 10-325 MG tablet Take 1 tablet by mouth 5 (five) times daily as needed for pain. 150 tablet 0   oxyCODONE -acetaminophen  (PERCOCET) 7.5-325 MG tablet Take 1 tablet by mouth five times a day as needed for pain. 150 tablet 0   oxyCODONE -acetaminophen  (PERCOCET) 7.5-325 MG tablet Take 1 tablet by mouth 5 (five) times daily as needed for pain (dose reduction) 150 tablet 0   polyethylene glycol (MIRALAX  MIX-IN PAX) 17 g packet Take 17 g by mouth daily.     potassium chloride  SA (KLOR-CON  M) 20 MEQ tablet Take 1 tablet (20 mEq total) by mouth in the morning and at bedtime. 180 tablet 1   Psyllium (METAMUCIL PO) Take 1 capsule by mouth daily.     sacubitril -valsartan  (ENTRESTO ) 97-103 MG Take 1 tablet by mouth 2 (two) times daily. 180 tablet 3   Semaglutide  (RYBELSUS ) 7 MG TABS Take 1 tablet (7 mg total) by mouth daily. 90 tablet 1   spironolactone  (ALDACTONE ) 25 MG tablet Take 1 tablet (25 mg total) by mouth daily. 90 tablet 0   thiamine  (VITAMIN B-1) 50 MG tablet Take 1 tablet (50 mg total) by mouth daily. 100 tablet 1   warfarin (COUMADIN ) 3 MG tablet TAKE 1  TABLET ((3 MG) BY MOUTH DAILY EXCEPT TAKE 1 1/2 TABLETS (4.5 MG) ON SUNDAY AND WEDNESDAY OR AS DIRECTED BY ANTICOAGULATION CLINIC 110 tablet 0   furosemide  (LASIX ) 20 MG tablet Take 1 tablet (20 mg total) by mouth daily. 90 tablet 0   insulin  degludec (TRESIBA  FLEXTOUCH) 100 UNIT/ML FlexTouch Pen Inject 14 Units into the skin daily. 3 mL 1   No facility-administered medications prior to visit.    ROS Review of Systems  Constitutional: Negative.  Negative for appetite change, chills, diaphoresis and fatigue.  HENT: Negative.    Respiratory: Negative.  Negative for cough, shortness of breath and wheezing.   Cardiovascular:  Negative  for chest pain, palpitations and leg swelling.  Gastrointestinal: Negative.  Negative for abdominal pain, constipation, diarrhea, nausea and vomiting.  Genitourinary: Negative.  Negative for difficulty urinating.  Musculoskeletal:  Positive for arthralgias. Negative for back pain and myalgias.  Skin: Negative.   Neurological: Negative.  Negative for dizziness.  Hematological:  Negative for adenopathy. Does not bruise/bleed easily.  Psychiatric/Behavioral:  Positive for confusion, decreased concentration and dysphoric mood. Negative for self-injury and sleep disturbance. The patient is nervous/anxious.     Objective:  BP 114/74 (BP Location: Left Arm, Patient Position: Sitting, Cuff Size: Normal)   Pulse 78   Temp (!) 97.1 F (36.2 C) (Temporal)   Ht 5' (1.524 m)   Wt 176 lb 9.6 oz (80.1 kg)   SpO2 99%   BMI 34.49 kg/m   BP Readings from Last 3 Encounters:  08/08/23 114/74  05/15/23 (!) 108/52  03/20/23 116/82    Wt Readings from Last 3 Encounters:  08/08/23 176 lb 9.6 oz (80.1 kg)  03/20/23 167 lb (75.8 kg)  03/16/23 172 lb 6.4 oz (78.2 kg)    Physical Exam Vitals reviewed.  Constitutional:      Appearance: Normal appearance.  HENT:     Mouth/Throat:     Mouth: Mucous membranes are moist.  Eyes:     General: No scleral icterus.     Conjunctiva/sclera: Conjunctivae normal.  Cardiovascular:     Rate and Rhythm: Normal rate and regular rhythm.     Heart sounds: No murmur heard.    No friction rub. No gallop.  Pulmonary:     Effort: Pulmonary effort is normal.     Breath sounds: No stridor. No wheezing, rhonchi or rales.  Abdominal:     General: Abdomen is flat.     Palpations: There is no mass.     Tenderness: There is no abdominal tenderness. There is no guarding.     Hernia: No hernia is present.  Musculoskeletal:        General: Normal range of motion.     Cervical back: Neck supple.     Right lower leg: No edema.     Left lower leg: No edema.  Lymphadenopathy:     Cervical: No cervical adenopathy.  Skin:    General: Skin is warm and dry.  Neurological:     General: No focal deficit present.     Mental Status: She is alert. Mental status is at baseline.  Psychiatric:        Mood and Affect: Mood normal.     Lab Results  Component Value Date   WBC 4.9 08/15/2023   HGB 13.5 08/15/2023   HCT 41.4 08/15/2023   PLT 301.0 08/15/2023   GLUCOSE 337 (H) 08/15/2023   CHOL 113 03/20/2023   TRIG 113.0 03/20/2023   HDL 40.00 03/20/2023   LDLDIRECT 68 02/15/2023   LDLCALC 51 03/20/2023   ALT 15 03/13/2023   AST 24 03/13/2023   NA 136 08/15/2023   K 4.0 08/15/2023   CL 99 08/15/2023   CREATININE 0.85 08/15/2023   BUN 12 08/15/2023   CO2 30 08/15/2023   TSH 2.99 03/20/2023   INR 3.8 (A) 07/25/2023   HGBA1C 12.5 (H) 08/15/2023   MICROALBUR <0.7 10/20/2022    No results found.  Assessment & Plan:   Stage 3a chronic kidney disease (HCC)- Will monitor her renal function. -     Basic metabolic panel with GFR; Future -     Microalbumin / creatinine urine ratio;  Future -     Urinalysis, Routine w reflex microscopic; Future  Hypertension associated with diabetes (HCC)- Her BP is well controlled. -     Basic metabolic panel with GFR; Future -     CBC with Differential/Platelet; Future -      Urinalysis, Routine w reflex microscopic; Future  Insulin -requiring or dependent type II diabetes mellitus (HCC)- Will monitor her A1C. -     Basic metabolic panel with GFR; Future -     Urinalysis, Routine w reflex microscopic; Future -     Hemoglobin A1c; Future     Follow-up: No follow-ups on file.  Sandra Crouch, MD

## 2023-08-08 NOTE — Patient Instructions (Signed)
Pre visit review using our clinic review tool, if applicable. No additional management support is needed unless otherwise documented below in the visit note. 

## 2023-08-09 ENCOUNTER — Other Ambulatory Visit: Payer: Self-pay

## 2023-08-09 ENCOUNTER — Other Ambulatory Visit (HOSPITAL_COMMUNITY): Payer: Self-pay

## 2023-08-09 ENCOUNTER — Telehealth: Payer: Self-pay

## 2023-08-09 DIAGNOSIS — Z7901 Long term (current) use of anticoagulants: Secondary | ICD-10-CM

## 2023-08-09 MED ORDER — FUROSEMIDE 20 MG PO TABS
20.0000 mg | ORAL_TABLET | Freq: Every day | ORAL | 0 refills | Status: DC
  Filled 2023-08-09: qty 90, 90d supply, fill #0

## 2023-08-09 NOTE — Telephone Encounter (Signed)
 Pt missed coumadin  clinic apt yesterday.  Contacted pt to RS. She requested to have a lab INR drawn tomorrow when she comes to [facility] for her PCP labs.  She will go in the afternoon. Advised the result would not be back until the day after. Advised her to remind the lab that she also needs an INR drawn. Advised this nurse will f/u with her for warfarin dosing when the result is received.   Placed order for lab INR

## 2023-08-11 NOTE — Telephone Encounter (Signed)
 Does not appear pt had labs drawn yesterday. [facility] lab is not open today. If labs are needed today pt will need to go to [facility] lab.  LVM with this information.

## 2023-08-11 NOTE — Telephone Encounter (Signed)
 Pt LVM she was not feeling well today, having a lot of pain, and will not be able to make it to the lab today. She reported she will go on Monday, 6/9.

## 2023-08-14 ENCOUNTER — Ambulatory Visit: Payer: Medicare Other | Admitting: Cardiovascular Disease

## 2023-08-14 NOTE — Telephone Encounter (Signed)
 Pt reports she is having a lot of knee and shoulder pain and cannot make a lab apt today due to the pain. She reports right now she cannot stand/walk even with the walker due to the pain. She reports all she can do is bgo to the restroom. She was not aware she had a cardiology apt today. She reports she will call then and cancel.   Made lab apt at [facility] for tomorrow per pt request. Advised this nurse will f/u with warfarin dosing instructions the day after tomorrow when the result is returned. Pt verbalized understanding.

## 2023-08-15 ENCOUNTER — Other Ambulatory Visit (INDEPENDENT_AMBULATORY_CARE_PROVIDER_SITE_OTHER)

## 2023-08-15 ENCOUNTER — Other Ambulatory Visit: Payer: Self-pay

## 2023-08-15 DIAGNOSIS — E119 Type 2 diabetes mellitus without complications: Secondary | ICD-10-CM | POA: Diagnosis not present

## 2023-08-15 DIAGNOSIS — Z7901 Long term (current) use of anticoagulants: Secondary | ICD-10-CM

## 2023-08-15 DIAGNOSIS — N1831 Chronic kidney disease, stage 3a: Secondary | ICD-10-CM

## 2023-08-15 DIAGNOSIS — I152 Hypertension secondary to endocrine disorders: Secondary | ICD-10-CM

## 2023-08-15 DIAGNOSIS — Z794 Long term (current) use of insulin: Secondary | ICD-10-CM | POA: Diagnosis not present

## 2023-08-15 DIAGNOSIS — E1159 Type 2 diabetes mellitus with other circulatory complications: Secondary | ICD-10-CM | POA: Diagnosis not present

## 2023-08-15 DIAGNOSIS — I480 Paroxysmal atrial fibrillation: Secondary | ICD-10-CM

## 2023-08-15 LAB — BASIC METABOLIC PANEL WITH GFR
BUN: 12 mg/dL (ref 6–23)
CO2: 30 meq/L (ref 19–32)
Calcium: 9.9 mg/dL (ref 8.4–10.5)
Chloride: 99 meq/L (ref 96–112)
Creatinine, Ser: 0.85 mg/dL (ref 0.40–1.20)
GFR: 66.92 mL/min (ref >60.00)
Glucose, Bld: 337 mg/dL — ABNORMAL HIGH (ref 70–99)
Potassium: 4 meq/L (ref 3.5–5.1)
Sodium: 136 meq/L (ref 135–145)

## 2023-08-15 LAB — CBC WITH DIFFERENTIAL/PLATELET
Basophils Absolute: 0 10*3/uL (ref 0.0–0.1)
Basophils Relative: 0.6 % (ref 0.0–3.0)
Eosinophils Absolute: 0.1 10*3/uL (ref 0.0–0.7)
Eosinophils Relative: 1.1 % (ref 0.0–5.0)
HCT: 41.4 % (ref 36.0–46.0)
Hemoglobin: 13.5 g/dL (ref 12.0–15.0)
Lymphocytes Relative: 41.6 % (ref 12.0–46.0)
Lymphs Abs: 2 10*3/uL (ref 0.7–4.0)
MCHC: 32.7 g/dL (ref 30.0–36.0)
MCV: 89.4 fl (ref 78.0–100.0)
Monocytes Absolute: 0.4 10*3/uL (ref 0.1–1.0)
Monocytes Relative: 8.8 % (ref 3.0–12.0)
Neutro Abs: 2.3 10*3/uL (ref 1.4–7.7)
Neutrophils Relative %: 47.9 % (ref 43.0–77.0)
Platelets: 301 10*3/uL (ref 150.0–400.0)
RBC: 4.63 Mil/uL (ref 3.87–5.11)
RDW: 15.6 % — ABNORMAL HIGH (ref 11.5–15.5)
WBC: 4.9 10*3/uL (ref 4.0–10.5)

## 2023-08-15 LAB — HEMOGLOBIN A1C: Hgb A1c MFr Bld: 12.5 % — ABNORMAL HIGH (ref 4.6–6.5)

## 2023-08-16 ENCOUNTER — Other Ambulatory Visit: Payer: Self-pay

## 2023-08-16 ENCOUNTER — Other Ambulatory Visit (HOSPITAL_COMMUNITY): Payer: Self-pay

## 2023-08-16 ENCOUNTER — Ambulatory Visit: Payer: Self-pay | Admitting: Internal Medicine

## 2023-08-16 ENCOUNTER — Ambulatory Visit: Admitting: Obstetrics and Gynecology

## 2023-08-16 ENCOUNTER — Telehealth: Payer: Self-pay | Admitting: Internal Medicine

## 2023-08-16 ENCOUNTER — Other Ambulatory Visit: Payer: Self-pay | Admitting: Internal Medicine

## 2023-08-16 ENCOUNTER — Encounter: Payer: Self-pay | Admitting: Obstetrics and Gynecology

## 2023-08-16 VITALS — BP 107/73 | HR 79

## 2023-08-16 DIAGNOSIS — I480 Paroxysmal atrial fibrillation: Secondary | ICD-10-CM

## 2023-08-16 DIAGNOSIS — R350 Frequency of micturition: Secondary | ICD-10-CM | POA: Diagnosis not present

## 2023-08-16 DIAGNOSIS — T8389XA Other specified complication of genitourinary prosthetic devices, implants and grafts, initial encounter: Secondary | ICD-10-CM

## 2023-08-16 DIAGNOSIS — Z7901 Long term (current) use of anticoagulants: Secondary | ICD-10-CM | POA: Diagnosis not present

## 2023-08-16 DIAGNOSIS — N3281 Overactive bladder: Secondary | ICD-10-CM

## 2023-08-16 DIAGNOSIS — N898 Other specified noninflammatory disorders of vagina: Secondary | ICD-10-CM

## 2023-08-16 MED ORDER — ESTRADIOL 0.1 MG/GM VA CREA
0.5000 g | TOPICAL_CREAM | VAGINAL | 5 refills | Status: DC
  Filled 2023-08-16: qty 42.5, 90d supply, fill #0
  Filled 2023-08-17 (×2): qty 42.5, 298d supply, fill #0

## 2023-08-16 MED ORDER — TRESIBA FLEXTOUCH 100 UNIT/ML ~~LOC~~ SOPN
30.0000 [IU] | PEN_INJECTOR | Freq: Every day | SUBCUTANEOUS | 0 refills | Status: DC
Start: 2023-08-16 — End: 2024-01-06
  Filled 2023-08-16: qty 9, 30d supply, fill #0
  Filled 2023-09-25: qty 9, 30d supply, fill #1
  Filled 2023-11-18: qty 9, 30d supply, fill #2

## 2023-08-16 MED ORDER — MIRABEGRON ER 50 MG PO TB24
50.0000 mg | ORAL_TABLET | Freq: Every day | ORAL | 5 refills | Status: DC
  Filled 2023-08-16: qty 30, 30d supply, fill #0

## 2023-08-16 NOTE — Telephone Encounter (Signed)
 Noted.  Dr.Jones, FYI

## 2023-08-16 NOTE — Telephone Encounter (Signed)
 Copied from CRM [id number]. Topic: General - Other >> Aug 15, 2023  3:55 PM Adrionna Y wrote: Reason for CRM: Patient is calling to let Dr Rochelle Chu know she wasn't able  to  do an urinalysis  today at her lab appointment.   She stated she will do it at her urology appointment tomorrow and get the results from them

## 2023-08-16 NOTE — Progress Notes (Signed)
 [facility] Return Visit  SUBJECTIVE  History of Present Illness: Kristen [name] is a 78 y.o. female seen in follow-up for OAB and pessary use. Plan at last visit was to continue estrogen cream x2 weekly, continue Gemtesa  75 mg daily and continue pessary.   Patient has noticed some irritation with the pessary and small amounts of discharge. She states she has not been using estrogen cream x2 weeks.    Patient was unable to afford Gemtesa  75mg  daily, so we sent in Myrbetriq  50mg  daily and she reports she never picked it up.  Past Medical History: Patient  has a past medical history of Arthritis, Blood transfusion without reported diagnosis, Bursitis, CAD (coronary artery disease), Chronic anticoagulation, DDD (degenerative disc disease), lumbar, Depression, Diabetes mellitus, Diastolic dysfunction, Fibromyalgia, GERD (gastroesophageal reflux disease) (10/23/2003), Headache(784.0), Hyperlipidemia, Hypertension, Hypokalemia, LBBB (left bundle branch block), Lumbar back pain, LV dysfunction, Lymphadenitis, Morbid obesity (HCC), PAF (paroxysmal atrial fibrillation) (HCC), and Stroke (HCC) (2004).   Past Surgical History: She  has a past surgical history that includes Angioplasty (laminectomy); Lumbar laminectomy; Coronary artery bypass graft; Abdominal hysterectomy; Cholecystectomy; Tonsillectomy; Knee arthroscopy; Sphincterotomy; left heart catheterization with coronary/graft angiogram (12/07/2012); and RIGHT/LEFT HEART CATH AND CORONARY/GRAFT ANGIOGRAPHY (N/A, 11/22/2021).   Medications: She has a current medication list which includes the following prescription(s): alprazolam , atorvastatin , blood glucose monitor system, dexcom g7 sensor, vitamin deficiency system-b12, cyclobenzaprine , diclofenac  sodium, fluocinonide , furosemide , gabapentin , hydrocortisone , tresiba  flextouch, comfort ez pen needles, jardiance , magnesium  oxide, metformin , metoprolol  succinate, nitroglycerin ,  oxycodone -acetaminophen , oxycodone -acetaminophen , oxycodone -acetaminophen , oxycodone -acetaminophen , oxycodone -acetaminophen , polyethylene glycol, potassium chloride  sa, psyllium, sacubitril -valsartan , rybelsus , spironolactone , thiamine , warfarin, [START ON 08/17/2023] estradiol , and mirabegron  er.   Allergies: Patient is allergic to metformin  and related, cleocin  [clindamycin  hcl], codeine, macrolides and ketolides, morphine, pentazocine lactate, vibramycin [doxycycline calcium ], definity  [perflutren  lipid microsphere], doxycycline, pentazocine, and sulfa antibiotics.   Social History: Patient  reports that she quit smoking about 22 years ago. Her smoking use included cigarettes. She has been exposed to tobacco smoke. She has never used smokeless tobacco. She reports current drug use. Drug: Oxycodone . She reports that she does not drink alcohol.     OBJECTIVE     Physical Exam: Vitals:   08/16/23 1531  BP: 107/73  Pulse: 79   Gen: No apparent distress, A&O x 3.  Detailed Urogynecologic Evaluation:  Speculum exam reveals some vaginal discharge that is blood tinged. Pessary removed and left out. Small area of excoriation noted at apex.   ASSESSMENT AND PLAN    Kristen [name] is a 78 y.o. with:  1. Urinary frequency   2. OAB (overactive bladder)   3. Vaginal irritation from pessary [facility])    As patient never started the Myrbetriq , we will see about starting Myrbetriq  50mg  daily for her OAB symptoms and nocturia.  Due to patient being previously hospitalized for constipation, I will not prescribe any of the medications in the anticholinergic family. If Gemtesa  and Myrbetriq  are not options due to cost, we will consider other alternatives.  Patient's pessary has been left out to allow for tissue healing. Patient to use estrogen cream x2 weekly to promote tissue support and we will follow up in 1 month to consider re-insertion.   Patient to follow up in 1 month or sooner if needed.    Kristen Serfass G Tymeir Weathington, NP

## 2023-08-16 NOTE — Patient Instructions (Signed)
 Please start Myrbetriq  50mg  daily.  Re-start estrogen use twice a week.   We can re-insert the pessary and discuss next steps if the medication is not working in about 4 weeks.

## 2023-08-16 NOTE — Addendum Note (Signed)
 Addended by: Arcadio Knuckles on: 08/16/2023 08:05 AM   Modules accepted: Orders

## 2023-08-17 ENCOUNTER — Ambulatory Visit (INDEPENDENT_AMBULATORY_CARE_PROVIDER_SITE_OTHER): Payer: Self-pay

## 2023-08-17 ENCOUNTER — Other Ambulatory Visit: Payer: Self-pay

## 2023-08-17 ENCOUNTER — Encounter: Payer: Self-pay | Admitting: Pharmacist

## 2023-08-17 ENCOUNTER — Other Ambulatory Visit (HOSPITAL_COMMUNITY): Payer: Self-pay

## 2023-08-17 ENCOUNTER — Encounter: Payer: Self-pay | Admitting: Obstetrics and Gynecology

## 2023-08-17 DIAGNOSIS — Z7901 Long term (current) use of anticoagulants: Secondary | ICD-10-CM | POA: Diagnosis not present

## 2023-08-17 LAB — PROTIME-INR
INR: 4.8 — ABNORMAL HIGH
Prothrombin Time: 45.5 s — ABNORMAL HIGH (ref 9.0–11.5)

## 2023-08-17 NOTE — Progress Notes (Addendum)
 Pt had lab INR drawn yesterday. Result received this morning. Pt reports she has been having a lot of knee pain. Pt denies any other changes. Pt reports she has been having a lot of knee pain.  Hold dose today and and hold dose tomorrow and then change weekly dose to take 1 tablet daily. Recheck in 2 weeks.  Contacted pt by phone and advised of dosing and recheck date. Pt readback instructions and wrote them down. Pt verbalized understanding.   Advised pt of lab results and PCP msg also. Pt reports she has been drinking  a lot of Coke. She reports she is going to stop buying them. Advised if any further questions to contact office. Pt reported she had a call from [facility] concerning lab results. Advised pt this nurse would let office know she was given lab results. Pt verbalized understanding.

## 2023-08-17 NOTE — Patient Instructions (Addendum)
 Pre visit review using our clinic review tool, if applicable. No additional management support is needed unless otherwise documented below in the visit note.  Hold dose today and and hold dose tomorrow and then change weekly dose to take 1 tablet daily. Recheck in 2 weeks.

## 2023-08-28 ENCOUNTER — Other Ambulatory Visit: Payer: Self-pay | Admitting: Internal Medicine

## 2023-08-28 ENCOUNTER — Other Ambulatory Visit (HOSPITAL_COMMUNITY): Payer: Self-pay

## 2023-08-28 DIAGNOSIS — I5042 Chronic combined systolic (congestive) and diastolic (congestive) heart failure: Secondary | ICD-10-CM

## 2023-08-29 ENCOUNTER — Ambulatory Visit (INDEPENDENT_AMBULATORY_CARE_PROVIDER_SITE_OTHER)

## 2023-08-29 DIAGNOSIS — D510 Vitamin B12 deficiency anemia due to intrinsic factor deficiency: Secondary | ICD-10-CM

## 2023-08-29 DIAGNOSIS — Z7901 Long term (current) use of anticoagulants: Secondary | ICD-10-CM

## 2023-08-29 LAB — POCT INR: INR: 2.7 (ref 2.0–3.0)

## 2023-08-29 MED ORDER — CYANOCOBALAMIN 1000 MCG/ML IJ SOLN
1000.0000 ug | Freq: Once | INTRAMUSCULAR | Status: AC
  Administered 2023-08-29: 1000 ug via INTRAMUSCULAR

## 2023-08-29 NOTE — Progress Notes (Signed)
 Continue 1 tablet daily. Recheck in 5 weeks.   Pt is due for B12 injection. Per orders of Dr. Joshua, injection of B12 given by Clotilda Public. Patient tolerated injection well.

## 2023-08-29 NOTE — Patient Instructions (Addendum)
 Pre visit review using our clinic review tool, if applicable. No additional management support is needed unless otherwise documented below in the visit note.  Continue 1 tablet daily. Recheck in 5 weeks.

## 2023-08-30 ENCOUNTER — Other Ambulatory Visit (HOSPITAL_COMMUNITY): Payer: Self-pay

## 2023-08-30 ENCOUNTER — Other Ambulatory Visit: Payer: Self-pay

## 2023-08-30 MED ORDER — SPIRONOLACTONE 25 MG PO TABS
25.0000 mg | ORAL_TABLET | Freq: Every day | ORAL | 0 refills | Status: DC
Start: 2023-08-30 — End: 2023-12-05
  Filled 2023-08-30: qty 90, 90d supply, fill #0

## 2023-08-31 ENCOUNTER — Other Ambulatory Visit (HOSPITAL_COMMUNITY): Payer: Self-pay

## 2023-08-31 ENCOUNTER — Encounter (HOSPITAL_COMMUNITY): Payer: Self-pay

## 2023-08-31 DIAGNOSIS — M150 Primary generalized (osteo)arthritis: Secondary | ICD-10-CM | POA: Diagnosis not present

## 2023-08-31 DIAGNOSIS — Z79891 Long term (current) use of opiate analgesic: Secondary | ICD-10-CM | POA: Diagnosis not present

## 2023-08-31 DIAGNOSIS — M47816 Spondylosis without myelopathy or radiculopathy, lumbar region: Secondary | ICD-10-CM | POA: Diagnosis not present

## 2023-08-31 DIAGNOSIS — G894 Chronic pain syndrome: Secondary | ICD-10-CM | POA: Diagnosis not present

## 2023-08-31 MED ORDER — OXYCODONE-ACETAMINOPHEN 10-325 MG PO TABS
1.0000 | ORAL_TABLET | Freq: Every day | ORAL | 0 refills | Status: DC | PRN
  Filled 2023-08-31: qty 150, 30d supply, fill #0

## 2023-09-01 ENCOUNTER — Other Ambulatory Visit: Payer: Self-pay

## 2023-09-05 ENCOUNTER — Other Ambulatory Visit: Payer: Self-pay | Admitting: Internal Medicine

## 2023-09-05 ENCOUNTER — Other Ambulatory Visit (HOSPITAL_COMMUNITY): Payer: Self-pay

## 2023-09-05 DIAGNOSIS — F411 Generalized anxiety disorder: Secondary | ICD-10-CM

## 2023-09-06 ENCOUNTER — Other Ambulatory Visit (HOSPITAL_COMMUNITY): Payer: Self-pay

## 2023-09-06 MED ORDER — ALPRAZOLAM 1 MG PO TABS
1.0000 mg | ORAL_TABLET | Freq: Three times a day (TID) | ORAL | 0 refills | Status: DC | PRN
  Filled 2023-09-06: qty 270, 90d supply, fill #0

## 2023-09-07 ENCOUNTER — Telehealth: Payer: Self-pay

## 2023-09-07 NOTE — Telephone Encounter (Signed)
 Patient has been made aware that her medication is in the office and ready for pickup. She gave a verbal understanding.

## 2023-09-12 ENCOUNTER — Ambulatory Visit: Admitting: Internal Medicine

## 2023-09-13 ENCOUNTER — Telehealth: Payer: Self-pay | Admitting: Pharmacy Technician

## 2023-09-13 NOTE — Progress Notes (Signed)
 09/13/2023 Name: Kristen [name] MRN: [id number] DOB: 1945-11-24  Patient is appearing on a report for [employer] [facility] and last engaged with the clinical pharmacist to discuss diabetes on 08/03/2023. Contacted patient today to discuss diabetes management and completed medication review.   Diabetes Plan from last clinical pharmacist appointment:  Diabetes: - Currently uncontrolled, A1c goal <8% - Reviewed long term cardiovascular and renal outcomes of uncontrolled blood sugar - Reviewed goal A1c, goal fasting, and goal 2 hour post prandial glucose - Reviewed dietary modifications including increasing protein/fiber, balanced meals/snacks - Reviewed lifestyle modifications including: increased movement as able - Recommend to check glucose daily  -  Continue current regimen -  She is going to have her son help download the Dexcom G7 app on her phone this weekend if possible. Will see patient on 6/3 while she is in office to finish Dexcom G7 set up if possible. If she is unable to get app, will send Rx for reader Med Access: Entresto  and Jardiance  have been approved *needs furosemide  and metformin  refill Follow Up Plan: 6/3(copy/paste from last note)   Medication Adherence Barriers Identified:  Patient made recommended medication changes per plan: Yes Patient informs She takes Rybelsus  7mg  daily and picks up from office when they inform her it has arrived as she gets thru PAP. She also informs she takes Jardiance  25mg  daily. She also gets thru [facility]AP and has had some delivery issues but thinks that has been resolved now. Patient informs she takes Metformin  as well but per Dr Annemarie, last filled on 07/03/23 for 30 days supply but patient informs she does have this medication on hand. Patient informs she takes Tresiba  30 units daily. This is NOT thru PAP program (will inquire with PharmD as she is approved with [employer] for Rybelsus  and Tresiba  is also thru [employer]). Per Dr Annemarie,  30 days supply last filled on 08/16/2023.   Access issues with any new medication or testing device: Yes Has not been able to get Dexcom G7 set up on her phone. Unclear is she has the actual reader. She informs she thought she did but it was never established what kind of reader. Patient requested an in person meeting with PharmD to get this set up. In basket message sent to Care Guide and PharmD for appointment request per patient. Was unable to meet with PharmD at last PCP vist on 08/08/23 as she had another appointment to get to and was running late.  Patient is checking blood sugars as prescribed: Yes She informs she is checking her blood sugar and her blood sugar ranges between 120-150 depending on what she eats.She expresses a desire to be able to eat salads for lunch and dinner with protein as she was able to do this previously and asw results. However, this is complicated by Warfarin use. She has an appointment with RD to discuss diet as it relates to Diabetes, heart failure and warfarin usage.  Patient expresses a desire to come off of insulin . She informs she feels if diet were under better control along with the medications and being able to track blood sugar with Dexcom, this would be feasible.  Patient informs Rybelsus  tastes bad when she swallows it and it sometimes makes her stomach hurt. She informs she takes 30 minutes prior to eating and drinking. Will send message to PharmD for any tips or tricks to make administration more tolerable.  Medication Adherence Barriers Addressed/Actions Taken:  Reviewed medication changes per plan from  last clinical pharmacist note Medication Access for Dexcom G7 set up Will discuss medication access concerns with pharmacist Collaborated with Care Guide to schedule an in person visit with PharmD Contacted pharmacy regarding new prescriptions Educated patient to contact pharmacy or PAP regarding  prescriptions Patient Assistance Program approved for  Rybelsus  and Jardiance  Reviewed instructions for monitoring blood sugars at home and reminded patient to keep a written log to review with pharmacist Reminded patient of date/time of upcoming clinical pharmacist follow up and any upcoming PCP/specialists visits. Patient denies transportation barriers to the appointment. No patient informs she has no transportation at the moment but is working on this. Patients preferred pharmacy is listed as [facility] which delivers.  Next clinical pharmacist appointment is scheduled for: TBD request sent to Care Guide for an in person meeting with PharmD as soon as availability allows.  Manases Etchison, CPhT [facility] Office: [phone] Email: [email or web address]

## 2023-09-18 DIAGNOSIS — M6281 Muscle weakness (generalized): Secondary | ICD-10-CM | POA: Diagnosis not present

## 2023-09-21 ENCOUNTER — Other Ambulatory Visit (HOSPITAL_COMMUNITY): Payer: Self-pay

## 2023-09-21 ENCOUNTER — Other Ambulatory Visit: Payer: Self-pay | Admitting: Internal Medicine

## 2023-09-25 ENCOUNTER — Telehealth: Payer: Self-pay | Admitting: Internal Medicine

## 2023-09-25 ENCOUNTER — Other Ambulatory Visit: Payer: Self-pay | Admitting: Internal Medicine

## 2023-09-25 ENCOUNTER — Ambulatory Visit

## 2023-09-25 ENCOUNTER — Other Ambulatory Visit (HOSPITAL_COMMUNITY): Payer: Self-pay

## 2023-09-25 DIAGNOSIS — E1151 Type 2 diabetes mellitus with diabetic peripheral angiopathy without gangrene: Secondary | ICD-10-CM

## 2023-09-25 DIAGNOSIS — M797 Fibromyalgia: Secondary | ICD-10-CM

## 2023-09-25 NOTE — Telephone Encounter (Signed)
 Copied from CRM [id number]. Topic: Appointments - Scheduling Inquiry for Clinic >> Sep 25, 2023  9:12 AM Avram MATSU wrote: Reason for CRM: patient had to cancel her appt and would like to set up a new one with the pharmacist

## 2023-09-26 ENCOUNTER — Other Ambulatory Visit (HOSPITAL_COMMUNITY): Payer: Self-pay

## 2023-09-26 ENCOUNTER — Other Ambulatory Visit: Payer: Self-pay

## 2023-09-26 ENCOUNTER — Ambulatory Visit: Admitting: Skilled Nursing Facility1

## 2023-09-26 DIAGNOSIS — Z79891 Long term (current) use of opiate analgesic: Secondary | ICD-10-CM | POA: Diagnosis not present

## 2023-09-26 DIAGNOSIS — G894 Chronic pain syndrome: Secondary | ICD-10-CM | POA: Diagnosis not present

## 2023-09-26 DIAGNOSIS — M47816 Spondylosis without myelopathy or radiculopathy, lumbar region: Secondary | ICD-10-CM | POA: Diagnosis not present

## 2023-09-26 DIAGNOSIS — M150 Primary generalized (osteo)arthritis: Secondary | ICD-10-CM | POA: Diagnosis not present

## 2023-09-26 MED ORDER — OXYCODONE-ACETAMINOPHEN 10-325 MG PO TABS
1.0000 | ORAL_TABLET | Freq: Every day | ORAL | 0 refills | Status: DC | PRN
  Filled 2023-10-03: qty 150, 30d supply, fill #0

## 2023-09-26 MED ORDER — GABAPENTIN 300 MG PO CAPS
300.0000 mg | ORAL_CAPSULE | Freq: Three times a day (TID) | ORAL | 1 refills | Status: AC
  Filled 2023-09-26 – 2023-10-02 (×2): qty 270, 90d supply, fill #0
  Filled 2024-03-18: qty 270, 90d supply, fill #1

## 2023-09-26 MED ORDER — MAGNESIUM OXIDE 400 MG PO TABS
1.0000 | ORAL_TABLET | Freq: Two times a day (BID) | ORAL | 0 refills | Status: DC
  Filled 2023-09-26: qty 180, 90d supply, fill #0

## 2023-09-27 ENCOUNTER — Ambulatory Visit: Admitting: Obstetrics and Gynecology

## 2023-09-27 ENCOUNTER — Encounter: Payer: Self-pay | Admitting: Obstetrics and Gynecology

## 2023-09-27 VITALS — BP 130/73 | HR 79

## 2023-09-27 DIAGNOSIS — N952 Postmenopausal atrophic vaginitis: Secondary | ICD-10-CM

## 2023-09-27 DIAGNOSIS — K5904 Chronic idiopathic constipation: Secondary | ICD-10-CM

## 2023-09-27 DIAGNOSIS — N3281 Overactive bladder: Secondary | ICD-10-CM

## 2023-09-27 DIAGNOSIS — N816 Rectocele: Secondary | ICD-10-CM | POA: Diagnosis not present

## 2023-09-27 MED ORDER — ESTRADIOL 0.1 MG/GM VA CREA: 0.5000 g | TOPICAL_CREAM | VAGINAL | 5 refills | Status: AC

## 2023-09-27 NOTE — Progress Notes (Signed)
 [facility] Return Visit  SUBJECTIVE  History of Present Illness: Kristen [name] is a 78 y.o. female seen in follow-up for Posterior prolapse, vaginal atrophy, and overactive bladder. Plan at last visit was started vaginal estrogen cream twice weekly for vaginal atrophy and follow-up pressure irritation.  Patient was also to start Myrbetriq  50 mg daily for her overactive bladder.  Patient denies any recent bleeding and states she has had no bleeding since about a day after her previous pessary was removed.  Patient today is tearful and depressed in nature.  She reports she has been feeling very depressed lately.  She states that her bills continue to rise and she has not received support from the [place] for survivorship insurance.  She states that she could not afford the estrogen cream and that she could not afford the Myrbetriq .  Patient also reports that she almost bought a medication on Facebook that cured diabetes and was almost scanned into spending over $200 which she found distressing.  Patient also reports that she has concerns for bugs in her stool.  She sometimes has fecal leakage that she feels looks like they are bugs in it.   Past Medical History: Patient  has a past medical history of Arthritis, Blood transfusion without reported diagnosis, Bursitis, CAD (coronary artery disease), Chronic anticoagulation, DDD (degenerative disc disease), lumbar, Depression, Diabetes mellitus, Diastolic dysfunction, Fibromyalgia, GERD (gastroesophageal reflux disease) (10/23/2003), Headache(784.0), Hyperlipidemia, Hypertension, Hypokalemia, LBBB (left bundle branch block), Lumbar back pain, LV dysfunction, Lymphadenitis, Morbid obesity (HCC), PAF (paroxysmal atrial fibrillation) (HCC), and Stroke (HCC) (2004).   Past Surgical History: She  has a past surgical history that includes Angioplasty (laminectomy); Lumbar laminectomy; Coronary artery bypass graft; Abdominal hysterectomy;  Cholecystectomy; Tonsillectomy; Knee arthroscopy; Sphincterotomy; left heart catheterization with coronary/graft angiogram (12/07/2012); and RIGHT/LEFT HEART CATH AND CORONARY/GRAFT ANGIOGRAPHY (N/A, 11/22/2021).   Medications: She has a current medication list which includes the following prescription(s): alprazolam , atorvastatin , blood glucose monitor system, dexcom g7 sensor, vitamin deficiency system-b12, cyclobenzaprine , diclofenac  sodium, fluocinonide , furosemide , gabapentin , hydrocortisone , tresiba  flextouch, comfort ez pen needles, jardiance , magnesium  oxide, metformin , metoprolol  succinate, nitroglycerin , oxycodone -acetaminophen , oxycodone -acetaminophen , oxycodone -acetaminophen , oxycodone -acetaminophen , [START ON 09/29/2023] oxycodone -acetaminophen , oxycodone -acetaminophen , oxycodone -acetaminophen , polyethylene glycol, potassium chloride  sa, psyllium, sacubitril -valsartan , rybelsus , spironolactone , thiamine , warfarin, and [START ON 09/28/2023] estradiol .   Allergies: Patient is allergic to metformin  and related, cleocin  [clindamycin  hcl], codeine, macrolides and ketolides, morphine, pentazocine lactate, vibramycin [doxycycline calcium ], definity  [perflutren  lipid microsphere], doxycycline, pentazocine, and sulfa antibiotics.   Social History: Patient  reports that she quit smoking about 22 years ago. Her smoking use included cigarettes. She has been exposed to tobacco smoke. She has never used smokeless tobacco. She reports current drug use. Drug: Oxycodone . She reports that she does not drink alcohol.     OBJECTIVE     Physical Exam: Vitals:   09/27/23 1531  BP: 130/73  Pulse: 79   Gen: No apparent distress, A&O x 3.  Detailed Urogynecologic Evaluation:  Deferred.    ASSESSMENT AND PLAN    Kristen [name] is a 78 y.o. with:  1. Vaginal atrophy   2. Chronic idiopathic constipation   3. Posterior vaginal wall prolapse   4. OAB (overactive bladder)    [facility] that patient can afford this.  Due to patient's fecal incontinence she does need some support for her vaginal tissues to decrease risk of UTIs especially in relation to her diabetes. Encourage patient to consider getting a  squatty potty.  She is already taking MiraLAX  and Colace daily for bowel movements due to her severe constipation.  She also tries not to take her narcotic pain medicine unless she absolutely needs it due to the increased risk of constipation.  We discussed that constipation can worsen posterior vaginal wall prolapse.  The POP Q tool was utilized to show her her vaginal defects. After reviewing her posterior vaginal prolapse we discussed not putting a pessary back in.  We discussed that this may not worsen over time especially if she can keep her constipation under control.  We discussed splinting.  She has been doing manual disimpaction when she needs to. As patient cannot afford Gemtesa  or Myrbetriq , I suggested pumpkin seed extract.  I will not prescribe her anticholinergic medications that can worsen her memory and increase her constipation to which she is understanding.  I do not feel comfortable recommending sacral neuromodulation or bladder Botox at this point.  I do not think the patient could safely self catheterize.  I encouraged patient to call if she is having concerns regarding her prolapse, UTI symptoms, or overactive bladder that is worsening.  Will plan for patient to follow-up in 6 months or sooner if needed.  After visit was done patient called me back into the room to look at the seat where she had been sitting and showed me a spot on the pad that she was concerned was a bug.  It appeared to be skin.  Nothing was moving and there was no sign of any activity from the spot.  We discussed that it is highly unlikely that she has bugs in her stool.  Kristen Silos G Lucus Lambertson, NP

## 2023-09-27 NOTE — Progress Notes (Unsigned)
 Cardiology Office Note:    Date:  09/28/2023   ID:  Kristen [name], DOB [birth year], MRN [id number]  PCP:  Joshua Debby CROME, MD   [facility] Providers Cardiologist:  Ozell Fell, MD     Referring MD: Joshua Debby CROME, MD   Chief Complaint  Patient presents with   Follow-up    CHF    History of Present Illness:    Kristen [name] is a 79 y.o. female with a hx of CAD s/p CABG in 2004, paroxysmal atrial fibrillation on Coumadin , CVA, obesity, diabetes, hypertension, LBBB, chronic systolic heart failure secondary to nonischemic cardiomyopathy with improved EF, fibromyalgia, and anxiety.  Echocardiogram in February 2023 demonstrated reduced EF 25-30%, previously normal.  GDMT was titrated.  She underwent repeat right and left heart catheterization September 2023 that showed patent coronary arteries without high-grade obstruction, patent LIMA-LAD.  Repeat echocardiogram 05/2022 showed improved LVEF-55%.  She remains on GDMT to include 25 mg Jardiance , 20 mg Lasix  daily, 100 mg Toprol  twice daily, 97-103 mg Entresto  twice daily, 25 mg spironolactone .   She is anticoagulated with Coumadin  due to cost.  She presents today for routine cardiology follow-up.  She has been feeling unwell for the last 2-3 days.  She describes seeing bugs in her apartment and bed and tells me she is seeing bugs in her stool. Records reviewed and similar complaint yesterday to urogyn, concerning stool was negative for bugs. She questions if this bug infestation is related to her skin inflammation, has seen dermatology. She wonders if medication side effects are contributory.   Long discussion regarding medications.  I think she is doing well from a cardiac standpoint. Thankfully, the exterminator is coming to her apartment twice monthly.    Past Medical History:  Diagnosis Date   Arthritis    Blood transfusion without reported diagnosis    Bursitis    CAD (coronary artery disease)    a.  s/p CABG 2004.b. stable cath 2014 demonstrating stable CAD and continued patency of her LIMA graft.   Chronic anticoagulation    on coumadin    DDD (degenerative disc disease), lumbar    Depression    Diabetes mellitus    Diastolic dysfunction    per echo in October 2012 with EF 50 to 55%   Fibromyalgia    GERD (gastroesophageal reflux disease) 10/23/2003   Headache(784.0)    Hyperlipidemia    Hypertension    Hypokalemia    LBBB (left bundle branch block)    Lumbar back pain    LV dysfunction    a. EF 45% by cath 2014. b. EF 50-55% by technically difficult echo in 08/2014.   Lymphadenitis    Morbid obesity (HCC)    a. Sleep study negative for significant OSA in 11/2014.   PAF (paroxysmal atrial fibrillation) (HCC)    Stroke (HCC) 2004   affected speech per pt    Past Surgical History:  Procedure Laterality Date   ABDOMINAL HYSTERECTOMY     ANGIOPLASTY  laminectomy   CHOLECYSTECTOMY     CORONARY ARTERY BYPASS GRAFT     LIMA to LAD    KNEE ARTHROSCOPY     LEFT HEART CATHETERIZATION WITH CORONARY/GRAFT ANGIOGRAM  12/07/2012   Procedure: LEFT HEART CATHETERIZATION WITH EL BILE;  Surgeon: Ozell JONETTA Fell, MD;  Location: [facility] CATH LAB;  Service: Cardiovascular;;   LUMBAR LAMINECTOMY     x3   RIGHT/LEFT HEART CATH AND CORONARY/GRAFT ANGIOGRAPHY N/A 11/22/2021   Procedure: RIGHT/LEFT HEART CATH AND  CORONARY/GRAFT ANGIOGRAPHY;  Surgeon: Wonda Sharper, MD;  Location: [facility] INVASIVE CV LAB;  Service: Cardiovascular;  Laterality: N/A;   SPHINCTEROTOMY     TONSILLECTOMY      Current Medications: Current Meds  Medication Sig   ALPRAZolam  (XANAX ) 1 MG tablet Take 1 tablet (1 mg total) by mouth 3 (three) times daily as needed.   atorvastatin  (LIPITOR) 40 MG tablet Take 1 tablet (40 mg total) by mouth daily.   Blood Glucose Monitoring Suppl (BLOOD GLUCOSE MONITOR SYSTEM) w/Device KIT Use 1 in the morning, at noon, and at bedtime.   Continuous Glucose Sensor (DEXCOM G7  SENSOR) MISC APPLY A SENSOR EVERY 10 DAYS DX:E11.9   Cyanocobalamin  (VITAMIN DEFICIENCY SYSTEM-B12) 1000 MCG/ML KIT Inject 1,000 mcg as directed every 30 (thirty) days.   cyclobenzaprine  (FLEXERIL ) 10 MG tablet Take 10 mg by mouth 3 (three) times daily as needed for muscle spasms.   diclofenac  Sodium (VOLTAREN ) 1 % GEL Apply 2 g topically 4 (four) times daily. (Patient taking differently: Apply 2 g topically 4 (four) times daily as needed (for pain).)   estradiol  (ESTRACE ) 0.1 MG/GM vaginal cream Place 0.5 g vaginally 2 (two) times a week.   fluocinonide  (LIDEX ) 0.05 % external solution Apply a small amount topically to scalp twice a day as needed   furosemide  (LASIX ) 20 MG tablet Take 1 tablet (20 mg total) by mouth daily.   gabapentin  (NEURONTIN ) 300 MG capsule Take 1 capsule (300 mg total) by mouth 3 (three) times daily.   hydrocortisone  2.5 % cream Apply a small amount to affected area of face and ears twice a day as needed.   insulin  degludec (TRESIBA  FLEXTOUCH) 100 UNIT/ML FlexTouch Pen Inject 30 Units into the skin daily.   Insulin  Pen Needle (COMFORT EZ PEN NEEDLES) 31G X 8 MM MISC Use with insulin  pen once daily   JARDIANCE  25 MG TABS tablet Take one tablet by mouth daily.   magnesium  oxide (MAG-OX) 400 MG tablet Take 1 tablet (400 mg total) by mouth 2 (two) times daily.   metFORMIN  (GLUCOPHAGE -XR) 500 MG 24 hr tablet Take 4 tablets (2,000 mg total) by mouth daily with breakfast.   metoprolol  succinate (TOPROL -XL) 100 MG 24 hr tablet Take 1 tablet (100 mg total) by mouth in the morning and at bedtime.   nitroGLYCERIN  (NITROSTAT ) 0.4 MG SL tablet Place 1 tablet (0.4 mg total) under the tongue every 5 (five) minutes as needed for chest pain (x 3 doses). Reported on 05/08/2015   oxyCODONE -acetaminophen  (PERCOCET) 10-325 MG tablet Take 1 tablet by mouth 5 (five) times daily as needed for pain.   polyethylene glycol (MIRALAX  MIX-IN PAX) 17 g packet Take 17 g by mouth daily.   potassium chloride   SA (KLOR-CON  M) 20 MEQ tablet Take 1 tablet (20 mEq total) by mouth in the morning and at bedtime.   Psyllium (METAMUCIL PO) Take 1 capsule by mouth daily.   sacubitril -valsartan  (ENTRESTO ) 97-103 MG Take 1 tablet by mouth 2 (two) times daily.   Semaglutide  (RYBELSUS ) 7 MG TABS Take 1 tablet (7 mg total) by mouth daily.   spironolactone  (ALDACTONE ) 25 MG tablet Take 1 tablet (25 mg total) by mouth daily.   thiamine  (VITAMIN B-1) 50 MG tablet Take 1 tablet (50 mg total) by mouth daily.   warfarin (COUMADIN ) 3 MG tablet TAKE 1 TABLET ((3 MG) BY MOUTH DAILY EXCEPT TAKE 1 1/2 TABLETS (4.5 MG) ON SUNDAY AND WEDNESDAY OR AS DIRECTED BY ANTICOAGULATION CLINIC     Allergies:  Metformin  and related, Cleocin  [clindamycin  hcl], Codeine, Macrolides and ketolides, Morphine, Pentazocine lactate, Vibramycin [doxycycline calcium ], Definity  [perflutren  lipid microsphere], Doxycycline, Pentazocine, and Sulfa antibiotics   Social History   Socioeconomic History   Marital status: Widowed    Spouse name: Not on file   Number of children: 2   Years of education: Not on file   Highest education level: Not on file  Occupational History   Occupation: retired  Tobacco Use   Smoking status: Former    Current packs/day: 0.00    Types: Cigarettes    Quit date: 03/07/2001    Years since quitting: 22.5    Passive exposure: Current   Smokeless tobacco: Never  Vaping Use   Vaping status: Never Used  Substance and Sexual Activity   Alcohol use: No   Drug use: Yes    Types: Oxycodone    Sexual activity: Not Currently    Partners: Male  Other Topics Concern   Not on file  Social History Narrative   Lives locally, has help available if needed.   Social Drivers of Corporate investment banker Strain: Low Risk  (02/08/2023)   Overall Financial Resource Strain (CARDIA)    Difficulty of Paying Living Expenses: Not very hard  Food Insecurity: No Food Insecurity (03/17/2023)   Hunger Vital Sign    Worried About  Running Out of Food in the Last Year: Never true    Ran Out of Food in the Last Year: Never true  Transportation Needs: No Transportation Needs (03/17/2023)   PRAPARE - Administrator, Civil Service (Medical): No    Lack of Transportation (Non-Medical): No  Physical Activity: Inactive (02/08/2023)   Exercise Vital Sign    Days of Exercise per Week: 0 days    Minutes of Exercise per Session: 0 min  Stress: No Stress Concern Present (02/08/2023)   [employer] of Occupational Health - Occupational Stress Questionnaire    Feeling of Stress : Only a little  Social Connections: Socially Isolated (02/08/2023)   Social Connection and Isolation Panel    Frequency of Communication with Friends and Family: Three times a week    Frequency of Social Gatherings with Friends and Family: Twice a week    Attends Religious Services: Never    Database administrator or Organizations: No    Attends Banker Meetings: Never    Marital Status: Widowed     Family History: The patient's family history includes Anxiety disorder in her maternal aunt; Arthritis in her brother and mother; Colon cancer in her paternal uncle; Depression in her sister; Diabetes in her brother and maternal aunt; Heart attack in her father and sister; Heart disease in her father; Hypertension in her brother, mother, and son; Kidney cancer in her sister; Obesity in her son; Stroke in her mother. There is no history of Bladder Cancer or Uterine cancer.  ROS:   Please see the history of present illness.     All other systems reviewed and are negative.  EKGs/Labs/Other Studies Reviewed:    The following studies were reviewed today:       Recent Labs: 03/13/2023: ALT 15 03/14/2023: B Natriuretic Peptide 73.6 03/20/2023: Magnesium  2.1; TSH 2.99 08/15/2023: BUN 12; Creatinine, Ser 0.85; Hemoglobin 13.5; Platelets 301.0; Potassium 4.0; Sodium 136  Recent Lipid Panel    Component Value Date/Time   CHOL 113  03/20/2023 1345   CHOL 108 12/26/2017 0758   TRIG 113.0 03/20/2023 1345   HDL 40.00 03/20/2023 1345  HDL 38 (L) 12/26/2017 0758   CHOLHDL 3 03/20/2023 1345   VLDL 22.6 03/20/2023 1345   LDLCALC 51 03/20/2023 1345   LDLCALC 48 12/26/2017 0758   LDLDIRECT 68 02/15/2023 1639   LDLDIRECT 116.0 11/28/2008 1122     Risk Assessment/Calculations:                Physical Exam:    VS:  BP 128/76   Pulse 88   Ht 5' (1.524 m)   Wt 170 lb (77.1 kg)   SpO2 97%   BMI 33.20 kg/m     Wt Readings from Last 3 Encounters:  09/28/23 170 lb (77.1 kg)  08/08/23 176 lb 9.6 oz (80.1 kg)  03/20/23 167 lb (75.8 kg)     GEN:  Well nourished, well developed in no acute distress HEENT: Normal NECK: No JVD; No carotid bruits LYMPHATICS: No lymphadenopathy CARDIAC: RRR, no murmurs, rubs, gallops RESPIRATORY:  Clear to auscultation without rales, wheezing or rhonchi  ABDOMEN: Soft, non-tender, non-distended MUSCULOSKELETAL:  No edema; No deformity  SKIN: Warm and dry NEUROLOGIC:  Alert and oriented x 3 PSYCHIATRIC:  Normal affect   ASSESSMENT:    1. Coronary artery disease involving native coronary artery of native heart with angina pectoris (HCC)   2. Hx of CABG   3. PAF (paroxysmal atrial fibrillation) (HCC)   4. Chronic anticoagulation   5. Chronic combined systolic and diastolic CHF (congestive heart failure) (HCC)   6. Mixed diabetic hyperlipidemia associated with type 2 diabetes mellitus (HCC)    PLAN:    In order of problems listed above:  CAD s/p CABG x 1 with LIMA-LAD in 2004 - Last heart catheterization 2023 for reduced EF showed patent coronary arteries and patent LIMA-LAD - No aspirin  given Coumadin  -- no chest pain -- continue BB, 40 mg lipitor   Paroxysmal atrial fibrillation Chronic anticoagulation - Doing well on 100 mg Toprol  twice daily - Anticoagulated with Coumadin  for cost   Chronic systolic heart failure with improved LVEF felt nonischemic  cardiomyopathy - Last echocardiogram in 2023 with LVEF improved from 30-35% to 50-55% -- appears euvolemic today, no medication changes   DM with hyperglycemia - A1c was 12.5% - following with Dr. Joshua - may need to ultimately take her off jardiance    Chronic benzodiazepine use/dependence - has been on xanax  since the 1990s, currently on 1 mg nightly    Follow up in 6 months.          Medication Adjustments/Labs and Tests Ordered: Current medicines are reviewed at length with the patient today.  Concerns regarding medicines are outlined above.  No orders of the defined types were placed in this encounter.  No orders of the defined types were placed in this encounter.   Patient Instructions  Medication Instructions:  Your physician recommends that you continue on your current medications as directed. Please refer to the Current Medication list given to you today.  *If you need a refill on your cardiac medications before your next appointment, please call your pharmacy*  Lab Work: NONE If you have labs (blood work) drawn today and your tests are completely normal, you will receive your results only by: MyChart Message (if you have MyChart) OR A paper copy in the mail If you have any lab test that is abnormal or we need to change your treatment, we will call you to review the results.  Testing/Procedures: NONE  Follow-Up: At [facility], you and your health needs are our priority.  As part of our continuing mission to provide you with exceptional heart care, our providers are all part of one team.  This team includes your primary Cardiologist (physician) and Advanced Practice Providers or APPs (Physician Assistants and Nurse Practitioners) who all work together to provide you with the care you need, when you need it.  Your next appointment:   6 month(s)  Provider:   Ozell Fell, MD OR APP  We recommend signing up for the patient portal called MyChart.   Sign up information is provided on this After Visit Summary.  MyChart is used to connect with patients for Virtual Visits (Telemedicine).  Patients are able to view lab/test results, encounter notes, upcoming appointments, etc.  Non-urgent messages can be sent to your provider as well.   To learn more about what you can do with MyChart, go to [email or web address].          Signed, Jon Nat Hails, [place]  09/28/2023 3:30 PM     [facility]

## 2023-09-27 NOTE — Patient Instructions (Signed)
 Please try to start the estrogen cream from the [facility]. They will send you an email  Please consider doing pumpkin seed extract and doing a stool for your bowel movements. I will send you some links.

## 2023-09-28 ENCOUNTER — Encounter: Payer: Self-pay | Admitting: Physician Assistant

## 2023-09-28 ENCOUNTER — Ambulatory Visit: Attending: Physician Assistant | Admitting: Physician Assistant

## 2023-09-28 VITALS — BP 128/76 | HR 88 | Ht 60.0 in | Wt 170.0 lb

## 2023-09-28 DIAGNOSIS — E1169 Type 2 diabetes mellitus with other specified complication: Secondary | ICD-10-CM | POA: Diagnosis not present

## 2023-09-28 DIAGNOSIS — Z951 Presence of aortocoronary bypass graft: Secondary | ICD-10-CM | POA: Diagnosis not present

## 2023-09-28 DIAGNOSIS — Z7901 Long term (current) use of anticoagulants: Secondary | ICD-10-CM

## 2023-09-28 DIAGNOSIS — I25119 Atherosclerotic heart disease of native coronary artery with unspecified angina pectoris: Secondary | ICD-10-CM

## 2023-09-28 DIAGNOSIS — E782 Mixed hyperlipidemia: Secondary | ICD-10-CM | POA: Diagnosis not present

## 2023-09-28 DIAGNOSIS — I480 Paroxysmal atrial fibrillation: Secondary | ICD-10-CM | POA: Diagnosis not present

## 2023-09-28 DIAGNOSIS — I5042 Chronic combined systolic (congestive) and diastolic (congestive) heart failure: Secondary | ICD-10-CM | POA: Diagnosis not present

## 2023-09-28 NOTE — Patient Instructions (Signed)
 Medication Instructions:  Your physician recommends that you continue on your current medications as directed. Please refer to the Current Medication list given to you today.  *If you need a refill on your cardiac medications before your next appointment, please call your pharmacy*  Lab Work: NONE If you have labs (blood work) drawn today and your tests are completely normal, you will receive your results only by: MyChart Message (if you have MyChart) OR A paper copy in the mail If you have any lab test that is abnormal or we need to change your treatment, we will call you to review the results.  Testing/Procedures: NONE  Follow-Up: At [facility], you and your health needs are our priority.  As part of our continuing mission to provide you with exceptional heart care, our providers are all part of one team.  This team includes your primary Cardiologist (physician) and Advanced Practice Providers or APPs (Physician Assistants and Nurse Practitioners) who all work together to provide you with the care you need, when you need it.  Your next appointment:   6 month(s)  Provider:   Ozell Fell, MD OR APP  We recommend signing up for the patient portal called MyChart.  Sign up information is provided on this After Visit Summary.  MyChart is used to connect with patients for Virtual Visits (Telemedicine).  Patients are able to view lab/test results, encounter notes, upcoming appointments, etc.  Non-urgent messages can be sent to your provider as well.   To learn more about what you can do with MyChart, go to [email or web address].

## 2023-10-02 ENCOUNTER — Other Ambulatory Visit: Payer: Self-pay

## 2023-10-03 ENCOUNTER — Other Ambulatory Visit (HOSPITAL_COMMUNITY): Payer: Self-pay

## 2023-10-03 ENCOUNTER — Ambulatory Visit

## 2023-10-03 ENCOUNTER — Other Ambulatory Visit: Payer: Self-pay

## 2023-10-03 ENCOUNTER — Telehealth: Payer: Self-pay

## 2023-10-03 NOTE — Telephone Encounter (Signed)
 Pt reports she cannot make her coumadin  clinic apt today due to urinary incontinence. She requested to RS to 8/1. RS apt per pt request.

## 2023-10-06 ENCOUNTER — Ambulatory Visit

## 2023-10-06 DIAGNOSIS — D510 Vitamin B12 deficiency anemia due to intrinsic factor deficiency: Secondary | ICD-10-CM | POA: Diagnosis not present

## 2023-10-06 DIAGNOSIS — Z7901 Long term (current) use of anticoagulants: Secondary | ICD-10-CM | POA: Diagnosis not present

## 2023-10-06 LAB — POCT INR: INR: 1.7 — AB (ref 2.0–3.0)

## 2023-10-06 MED ORDER — CYANOCOBALAMIN 1000 MCG/ML IJ SOLN
1000.0000 ug | Freq: Once | INTRAMUSCULAR | Status: AC
  Administered 2023-10-06: 1000 ug via INTRAMUSCULAR

## 2023-10-06 NOTE — Patient Instructions (Addendum)
Pre visit review using our clinic review tool, if applicable. No additional management support is needed unless otherwise documented below in the visit note.  Increase dose today to take 1 1/2 tablets and then continue 1 tablet daily. Recheck in 2 weeks.

## 2023-10-06 NOTE — Progress Notes (Addendum)
 Increase dose today to take 1 1/2 tablets  and then continue 1 tablet daily. Recheck in 2 weeks.   Pt is due for B12 injection. Per orders of Dr. Garald, in absence of Dr. Joshua, injection of B12 given by Clotilda Public. Patient tolerated injection well.  Medical screening examination/treatment/procedure(s) were performed by non-physician practitioner and as supervising physician I was immediately available for consultation/collaboration.  I agree with above. Karlynn Garald, MD

## 2023-10-09 DIAGNOSIS — M6281 Muscle weakness (generalized): Secondary | ICD-10-CM | POA: Diagnosis not present

## 2023-10-12 ENCOUNTER — Ambulatory Visit: Admitting: Skilled Nursing Facility1

## 2023-10-16 ENCOUNTER — Other Ambulatory Visit: Payer: Self-pay | Admitting: Internal Medicine

## 2023-10-16 ENCOUNTER — Other Ambulatory Visit (HOSPITAL_COMMUNITY): Payer: Self-pay

## 2023-10-16 DIAGNOSIS — E519 Thiamine deficiency, unspecified: Secondary | ICD-10-CM

## 2023-10-17 ENCOUNTER — Other Ambulatory Visit (HOSPITAL_COMMUNITY): Payer: Self-pay

## 2023-10-17 ENCOUNTER — Other Ambulatory Visit: Payer: Self-pay

## 2023-10-17 MED ORDER — VITAMIN B-1 50 MG PO TABS
50.0000 mg | ORAL_TABLET | Freq: Every day | ORAL | 0 refills | Status: DC
Start: 2023-10-17 — End: 2024-01-19
  Filled 2023-10-17: qty 90, 90d supply, fill #0

## 2023-10-19 DIAGNOSIS — H52203 Unspecified astigmatism, bilateral: Secondary | ICD-10-CM | POA: Diagnosis not present

## 2023-10-19 DIAGNOSIS — H43812 Vitreous degeneration, left eye: Secondary | ICD-10-CM | POA: Diagnosis not present

## 2023-10-19 DIAGNOSIS — H2513 Age-related nuclear cataract, bilateral: Secondary | ICD-10-CM | POA: Diagnosis not present

## 2023-10-19 DIAGNOSIS — H04123 Dry eye syndrome of bilateral lacrimal glands: Secondary | ICD-10-CM | POA: Diagnosis not present

## 2023-10-19 DIAGNOSIS — E119 Type 2 diabetes mellitus without complications: Secondary | ICD-10-CM | POA: Diagnosis not present

## 2023-10-19 DIAGNOSIS — H524 Presbyopia: Secondary | ICD-10-CM | POA: Diagnosis not present

## 2023-10-19 DIAGNOSIS — H25013 Cortical age-related cataract, bilateral: Secondary | ICD-10-CM | POA: Diagnosis not present

## 2023-10-19 LAB — HM DIABETES EYE EXAM

## 2023-10-20 ENCOUNTER — Ambulatory Visit (INDEPENDENT_AMBULATORY_CARE_PROVIDER_SITE_OTHER)

## 2023-10-20 DIAGNOSIS — Z7901 Long term (current) use of anticoagulants: Secondary | ICD-10-CM | POA: Diagnosis not present

## 2023-10-20 LAB — POCT INR: INR: 2.1 (ref 2.0–3.0)

## 2023-10-20 NOTE — Progress Notes (Signed)
 Continue 1 tablet daily. Recheck in 5 weeks.   Pt is due for B12 injection after 9/1.

## 2023-10-20 NOTE — Patient Instructions (Addendum)
 Pre visit review using our clinic review tool, if applicable. No additional management support is needed unless otherwise documented below in the visit note.  Continue 1 tablet daily. Recheck in 5 weeks.

## 2023-10-24 ENCOUNTER — Other Ambulatory Visit (HOSPITAL_COMMUNITY): Payer: Self-pay

## 2023-10-24 DIAGNOSIS — Z79891 Long term (current) use of opiate analgesic: Secondary | ICD-10-CM | POA: Diagnosis not present

## 2023-10-24 DIAGNOSIS — G894 Chronic pain syndrome: Secondary | ICD-10-CM | POA: Diagnosis not present

## 2023-10-24 DIAGNOSIS — M150 Primary generalized (osteo)arthritis: Secondary | ICD-10-CM | POA: Diagnosis not present

## 2023-10-24 DIAGNOSIS — M47816 Spondylosis without myelopathy or radiculopathy, lumbar region: Secondary | ICD-10-CM | POA: Diagnosis not present

## 2023-10-24 MED ORDER — OXYCODONE-ACETAMINOPHEN 10-325 MG PO TABS
1.0000 | ORAL_TABLET | Freq: Every day | ORAL | 0 refills | Status: DC | PRN
  Filled 2023-11-01: qty 150, 30d supply, fill #0

## 2023-10-27 ENCOUNTER — Other Ambulatory Visit (HOSPITAL_COMMUNITY): Payer: Self-pay

## 2023-10-27 ENCOUNTER — Telehealth: Payer: Self-pay

## 2023-10-27 ENCOUNTER — Other Ambulatory Visit: Payer: Self-pay | Admitting: Internal Medicine

## 2023-10-27 DIAGNOSIS — E1169 Type 2 diabetes mellitus with other specified complication: Secondary | ICD-10-CM

## 2023-10-27 NOTE — Telephone Encounter (Signed)
 Copied from CRM [id number]. Topic: Clinical - Medication Question >> Oct 27, 2023  9:02 AM Mia F wrote: Reason for CRM: Pt says she heard on the news that the G6 and G7 sensor metor are having problems. They are not getting accurent readings. She would like to stick with what she currently uses and cancel appt with the pharmasist. She would like a call to dicuss this sensor a little more because she will be afraid to use it. Please advise

## 2023-10-30 ENCOUNTER — Ambulatory Visit: Payer: Self-pay | Admitting: Internal Medicine

## 2023-10-30 ENCOUNTER — Ambulatory Visit: Payer: Self-pay

## 2023-10-30 ENCOUNTER — Ambulatory Visit

## 2023-10-30 ENCOUNTER — Other Ambulatory Visit (HOSPITAL_COMMUNITY): Payer: Self-pay

## 2023-10-30 ENCOUNTER — Other Ambulatory Visit: Payer: Self-pay

## 2023-10-30 MED ORDER — ATORVASTATIN CALCIUM 40 MG PO TABS
40.0000 mg | ORAL_TABLET | Freq: Every day | ORAL | 1 refills | Status: AC
  Filled 2023-10-30: qty 90, 90d supply, fill #0
  Filled 2024-01-22: qty 90, 90d supply, fill #1

## 2023-10-30 NOTE — Telephone Encounter (Signed)
  FYI Only or Action Required?: FYI only for provider.  Patient was last seen in primary care on 08/08/2023 by Joshua Debby CROME, MD.  Called Nurse Triage reporting Abdominal Pain and Hemorrhoids.  Symptoms began chronic.  Interventions attempted: OTC medications: miralax , colace.  Symptoms are: unchanged.  Triage Disposition: See PCP When Office is Open (Within 3 Days)  Patient/caregiver understands and will follow disposition?: Yes  Copied from CRM #[id number]. Topic: Clinical - Red Word Triage >> Oct 30, 2023 11:19 AM Mesmerise C wrote: [facility] that prompted transfer to Nurse Triage: Patient called GI doctor and states can't get her seen until 9/5 still experiencing pain and wanting to see a provider before then Reason for Disposition  Unable to have a bowel movement (BM) without laxative or enema  Answer Assessment - Initial Assessment Questions Unable to get in with GI specialist before September  1. STOOL PATTERN OR FREQUENCY: How often do you have a bowel movement (BM)?  (Normal range: 3 times a day to every 3 days)  When was your last BM?       Chronic constipation 2. STRAINING: Do you have to strain to have a BM?      yes 3. ONSET: When did the constipation begin?     childhood 4. RECTAL PAIN: Does your rectum hurt when the stool comes out? If Yes, ask: Do you have hemorrhoids? How bad is the pain?  (Scale 1-10; or mild, moderate, severe)     Rectal pain and itching present 5. BM COMPOSITION: Are the stools hard?      Yes, hard stools 6. BLOOD ON STOOLS: Has there been any blood on the toilet tissue or on the surface of the BM? If Yes, ask: When was the last time?     denies 7. CHRONIC CONSTIPATION: Is this a new problem for you?  If No, ask: How long have you had this problem? (days, weeks, months)      lifetime 8. CHANGES IN DIET OR HYDRATION: Have there been any recent changes in your diet? How much fluids are you drinking on a daily basis?  How  much have you had to drink today?     Decreased appetite 9. MEDICINES: Have you been taking any new medicines? Are you taking any narcotic pain medicines? (e.g., Dilaudid , morphine, Percocet, Vicodin)     Chronic pain patient on oxycodone  10. LAXATIVES: Have you been using any stool softeners, laxatives, or enemas?  If Yes, ask What are you using, how often, and when was the last time?       Has used miralax  and colace 11. ACTIVITY:  How much walking do you do every day?  Has your activity level decreased in the past week?         12. CAUSE: What do you think is causing the constipation?        Chronic constipation 13. MEDICAL HISTORY: Do you have a history of hemorrhoids, rectal fissures, rectal surgery, or rectal abscess?         Has history of surgery for hemorroids and anal sphincter surgery  14. OTHER SYMPTOMS: Do you have any other symptoms? (e.g., abdomen pain, bloating, fever, vomiting)       Denies abdominal pain 15. PREGNANCY: Is there any chance you are pregnant? When was your last menstrual period?       N/A  Protocols used: Constipation-A-AH

## 2023-10-30 NOTE — Telephone Encounter (Signed)
 FYI Only or Action Required?: FYI only for provider.  Patient was last seen in primary care on 08/08/2023 by Joshua Debby CROME, MD.  Called Nurse Triage reporting Constipation.  Symptoms began several days ago.  Interventions attempted: OTC medications: Colace, Miralax .  Symptoms are: unchanged.  Triage Disposition: Home Care  Patient/caregiver understands and will follow disposition?: Yes                     Copied from CRM #[id number]. Topic: Clinical - Red Word Triage >> Oct 30, 2023 10:34 AM Kristen [name] wrote: Red Word that prompted transfer to Nurse Triage: Patient is having issues with rectum she is in severe pain. She also has been having been having constipation going on she tried Miralax  as well still nothing. This has been going on since Saturday. Reason for Disposition  MILD constipation  Answer Assessment - Initial Assessment Questions Pt states she has vaginal prolapse. Pt states she has been being seen by Mallie at [facility]. Pt meant to call her first. This RN got the below information but pt states she is going to call her women's doctor to get an appt as she is who usually handles this issue. This RN told pt to call back with any further questions.  Itching in rectum  No bm in 3 days  Pt states she has been taking colace, Miralax   Pt states its not unusual for her to have this problem  8/18- pt states she had a really bad constipation where her stool was so big that she had to put on gloves and dig it out until it passed  Pt states she has not eaten yet today and needs to go eat.    STOOL PATTERN OR FREQUENCY: How often do you have a bowel movement (BM)?  (Normal range: 3 times a day to every 3 days)  When was your last BM?       Problems since little per pt; sometimes 4 days  RECTAL PAIN: Does your rectum hurt when the stool comes out? If Yes, ask: Do you have hemorrhoids? How bad is the pain?  (Scale 1-10; or mild, moderate,  severe)     Yes, a little  Protocols used: Constipation-A-AH

## 2023-10-31 ENCOUNTER — Other Ambulatory Visit: Payer: Self-pay | Admitting: Internal Medicine

## 2023-10-31 ENCOUNTER — Other Ambulatory Visit (HOSPITAL_COMMUNITY): Payer: Self-pay

## 2023-10-31 ENCOUNTER — Ambulatory Visit: Admitting: Internal Medicine

## 2023-10-31 DIAGNOSIS — Z7901 Long term (current) use of anticoagulants: Secondary | ICD-10-CM

## 2023-10-31 DIAGNOSIS — I480 Paroxysmal atrial fibrillation: Secondary | ICD-10-CM

## 2023-11-01 ENCOUNTER — Other Ambulatory Visit: Payer: Self-pay

## 2023-11-01 ENCOUNTER — Other Ambulatory Visit (HOSPITAL_COMMUNITY): Payer: Self-pay

## 2023-11-01 NOTE — Telephone Encounter (Signed)
 Can you call her and discuss this?

## 2023-11-02 ENCOUNTER — Encounter: Payer: Self-pay | Admitting: Family Medicine

## 2023-11-02 ENCOUNTER — Other Ambulatory Visit: Payer: Self-pay

## 2023-11-02 ENCOUNTER — Ambulatory Visit (INDEPENDENT_AMBULATORY_CARE_PROVIDER_SITE_OTHER): Admitting: Family Medicine

## 2023-11-02 ENCOUNTER — Other Ambulatory Visit (HOSPITAL_COMMUNITY): Payer: Self-pay

## 2023-11-02 VITALS — BP 112/80 | Temp 98.1°F | Ht 60.0 in

## 2023-11-02 DIAGNOSIS — R630 Anorexia: Secondary | ICD-10-CM | POA: Diagnosis not present

## 2023-11-02 DIAGNOSIS — R159 Full incontinence of feces: Secondary | ICD-10-CM

## 2023-11-02 DIAGNOSIS — E559 Vitamin D deficiency, unspecified: Secondary | ICD-10-CM

## 2023-11-02 DIAGNOSIS — K6289 Other specified diseases of anus and rectum: Secondary | ICD-10-CM

## 2023-11-02 DIAGNOSIS — N8110 Cystocele, unspecified: Secondary | ICD-10-CM

## 2023-11-02 DIAGNOSIS — K5909 Other constipation: Secondary | ICD-10-CM

## 2023-11-02 DIAGNOSIS — F411 Generalized anxiety disorder: Secondary | ICD-10-CM

## 2023-11-02 DIAGNOSIS — R32 Unspecified urinary incontinence: Secondary | ICD-10-CM

## 2023-11-02 DIAGNOSIS — D510 Vitamin B12 deficiency anemia due to intrinsic factor deficiency: Secondary | ICD-10-CM

## 2023-11-02 DIAGNOSIS — R110 Nausea: Secondary | ICD-10-CM | POA: Insufficient documentation

## 2023-11-02 DIAGNOSIS — F333 Major depressive disorder, recurrent, severe with psychotic symptoms: Secondary | ICD-10-CM

## 2023-11-02 DIAGNOSIS — F22 Delusional disorders: Secondary | ICD-10-CM

## 2023-11-02 MED ORDER — FUROSEMIDE 20 MG PO TABS
20.0000 mg | ORAL_TABLET | Freq: Every day | ORAL | 0 refills | Status: DC
  Filled 2023-11-02: qty 90, 90d supply, fill #0

## 2023-11-02 MED ORDER — METOPROLOL SUCCINATE ER 100 MG PO TB24
100.0000 mg | ORAL_TABLET | Freq: Two times a day (BID) | ORAL | 0 refills | Status: DC
  Filled 2023-11-02: qty 180, 90d supply, fill #0

## 2023-11-02 MED ORDER — CLOTRIMAZOLE-BETAMETHASONE 1-0.05 % EX CREA
1.0000 | TOPICAL_CREAM | Freq: Two times a day (BID) | CUTANEOUS | 0 refills | Status: AC
  Filled 2023-11-02 – 2023-11-03 (×2): qty 30, 15d supply, fill #0

## 2023-11-02 MED ORDER — POTASSIUM CHLORIDE CRYS ER 20 MEQ PO TBCR
20.0000 meq | EXTENDED_RELEASE_TABLET | Freq: Two times a day (BID) | ORAL | 0 refills | Status: DC
  Filled 2023-11-02: qty 180, 90d supply, fill #0

## 2023-11-02 NOTE — Patient Instructions (Signed)
 Sleep mask to try to help with sleep  I have sent in lotrisone  cream to help with your bottom  I have sent in a referral to a therapist. If you have not heard from them over the next week, give their office a call to get scheduled.   Follow-up with me for new or worsening symptoms.

## 2023-11-02 NOTE — Progress Notes (Signed)
 Acute Office Visit  Subjective:     Patient ID: Kristen [name], female    DOB: 1945-03-18, 78 y.o.   MRN: [id number]  No chief complaint on file.   HPI  Discussed the use of AI scribe software for clinical note transcription with the patient, who gave verbal consent to proceed.  History of Present Illness Kristen [name] Dick Pat is a 78 year old female who presents with concerns about rectal irritation and perceived bug infestation.  Rectal irritation and pruritus - Rectal irritation and itching due to liquid stool seepage associated with vaginal prolapse - Previously used topical cream for relief, but it has expired and cost is a barrier to obtaining more  Urinary urgency and incontinence - Urinary urgency and leakage upon standing - Pessary was removed due to irritation  Perceived parasitosis - Persistent sensation of bugs crawling on skin, especially at night - Apartment is sprayed biweekly, but continues to perceive presence of bugs including silverfish and carpet beetles - Significant distress due to perceived infestation, impacting sleep and appetite  Constipation and associated pain - Chronic constipation has worsened - Severe pain during bowel movements earlier this month - Hospitalized twice for constipation-related issues  Sleep disturbance and appetite loss - Appetite has significantly decreased, attributed to depression - Sleep is affected by depression and fear of bugs - Keeps lights and television on at night due to fear of bugs, further disrupting sleep  Long-term benzodiazepine use - Taking Xanax  for over thirty years, initially prescribed for high blood pressure - Concerned about effects of discontinuing Xanax      ROS Per HPI      Objective:    BP 112/80 (BP Location: Left Arm, Patient Position: Sitting)   Temp 98.1 F (36.7 C) (Temporal)   Ht 5' (1.524 m)   BMI 33.20 kg/m    Physical Exam Vitals and nursing note reviewed.   Constitutional:      General: She is not in acute distress.    Appearance: Normal appearance. She is normal weight.  HENT:     Head: Normocephalic and atraumatic.     Right Ear: External ear normal.     Left Ear: External ear normal.     Nose: Nose normal.     Mouth/Throat:     Mouth: Mucous membranes are moist.     Pharynx: Oropharynx is clear.  Eyes:     Extraocular Movements: Extraocular movements intact.     Pupils: Pupils are equal, round, and reactive to light.  Cardiovascular:     Rate and Rhythm: Normal rate and regular rhythm.     Pulses: Normal pulses.     Heart sounds: Normal heart sounds.  Pulmonary:     Effort: Pulmonary effort is normal. No respiratory distress.     Breath sounds: Normal breath sounds. No wheezing, rhonchi or rales.  Musculoskeletal:        General: Normal range of motion.     Cervical back: Normal range of motion.     Right lower leg: No edema.     Left lower leg: No edema.  Lymphadenopathy:     Cervical: No cervical adenopathy.  Neurological:     General: No focal deficit present.     Mental Status: She is alert and oriented to person, place, and time.  Psychiatric:        Mood and Affect: Mood is depressed. Affect is tearful.        Speech: Speech normal.  Behavior: Behavior is not agitated, aggressive, withdrawn, hyperactive or combative. Behavior is cooperative.        Thought Content: Thought content normal. Thought content is not paranoid. Thought content does not include homicidal or suicidal ideation. Thought content does not include homicidal or suicidal plan.     No results found for any visits on 11/02/23.      Assessment & Plan:   Assessment and Plan Assessment & Plan Rectal irritation and pruritus Likely due to fungal infection exacerbated by fecal seepage from vaginal prolapse. Previous antifungal treatment expired. - Prescribe antifungal and steroid cream for external use.  Vaginal prolapse with fecal and  urinary incontinence Surgical intervention not advised due to heart failure. Previous pessary use led to irritation and was discontinued. Financial constraints limit access to certain medications. Awaiting delivery of pumpkin seed extract for urinary symptoms. - Continue non-surgical management. - Consider pumpkin seed extract for urinary symptoms, pending delivery.  Chronic constipation Recent severe episode required hospitalization. Symptoms include difficulty passing stool and significant discomfort.  Major depressive disorder, recurrent, severe with psychotic features, GAD  Contributing to decreased appetite and insomnia. Prefers therapy over medication. -Intense and irrational fear of bugs crawling on her at home - Send urgent referral for therapy, with preference for a female therapist.  Anorexia (loss of appetite) Potentially related to depression and anxiety. Reports minimal food intake despite availability of nutritious meals.  Insomnia Possibly related to anxiety about pests and environmental factors. Concerns about discontinuing Xanax  due to long-term use. - Suggest trial of a sleep mask to improve sleep quality while allowing lights to remain on.     Orders Placed This Encounter  Procedures   Ambulatory referral to Psychology    Referral Priority:   Urgent    Referral Type:   Psychiatric    Referral Reason:   Specialty Services Required    Requested Specialty:   Psychology    Number of Visits Requested:   1     Meds ordered this encounter  Medications   clotrimazole -betamethasone  (LOTRISONE ) cream    Sig: Apply 1 Application topically 2 (two) times daily.    Dispense:  30 g    Refill:  0    Return for Therapist as scheduled.  Kristen LITTIE Ku, FNP

## 2023-11-03 ENCOUNTER — Other Ambulatory Visit (HOSPITAL_COMMUNITY): Payer: Self-pay

## 2023-11-03 ENCOUNTER — Other Ambulatory Visit: Payer: Self-pay

## 2023-11-03 ENCOUNTER — Other Ambulatory Visit: Payer: Self-pay | Admitting: Internal Medicine

## 2023-11-03 DIAGNOSIS — L309 Dermatitis, unspecified: Secondary | ICD-10-CM

## 2023-11-07 ENCOUNTER — Other Ambulatory Visit: Payer: Self-pay | Admitting: Family Medicine

## 2023-11-07 ENCOUNTER — Telehealth: Payer: Self-pay | Admitting: Cardiovascular Disease

## 2023-11-07 DIAGNOSIS — F411 Generalized anxiety disorder: Secondary | ICD-10-CM

## 2023-11-07 DIAGNOSIS — F333 Major depressive disorder, recurrent, severe with psychotic symptoms: Secondary | ICD-10-CM

## 2023-11-07 NOTE — Telephone Encounter (Signed)
 Patient reports her urologist would like for her to take Pumpkin seed extract and she would like to make sure this is OK with her current medications and heart conditions. She specifically pointed out wanting to make sure this would be OK with Entresto  and metoprolol .  Patient also states she purchased pumpkin seed oil 3000 mg. She is not sure if this is the same as pumpkin seed extract.  Will forward to pharmacy team to review.

## 2023-11-07 NOTE — Telephone Encounter (Signed)
 Urologist told pt to start talking Pumpkin Seed Extract/Oil Gel Caps 3000 mg. Pt wants to make sure it is ok to take with her other medications and her heart conditions. Please advise

## 2023-11-08 NOTE — Telephone Encounter (Signed)
 Spoke with patient and shared Pharm D's response. Patient verbalized understanding and expressed appreciation for call.

## 2023-11-10 ENCOUNTER — Other Ambulatory Visit (HOSPITAL_COMMUNITY): Payer: Self-pay

## 2023-11-10 ENCOUNTER — Ambulatory Visit: Admitting: Physician Assistant

## 2023-11-15 DIAGNOSIS — K6289 Other specified diseases of anus and rectum: Secondary | ICD-10-CM | POA: Insufficient documentation

## 2023-11-15 DIAGNOSIS — N8110 Cystocele, unspecified: Secondary | ICD-10-CM | POA: Insufficient documentation

## 2023-11-15 DIAGNOSIS — R159 Full incontinence of feces: Secondary | ICD-10-CM | POA: Insufficient documentation

## 2023-11-15 DIAGNOSIS — R630 Anorexia: Secondary | ICD-10-CM | POA: Insufficient documentation

## 2023-11-15 DIAGNOSIS — K5909 Other constipation: Secondary | ICD-10-CM | POA: Insufficient documentation

## 2023-11-18 ENCOUNTER — Other Ambulatory Visit (HOSPITAL_COMMUNITY): Payer: Self-pay

## 2023-11-20 ENCOUNTER — Other Ambulatory Visit (HOSPITAL_COMMUNITY): Payer: Self-pay

## 2023-11-20 ENCOUNTER — Other Ambulatory Visit: Payer: Self-pay

## 2023-11-21 ENCOUNTER — Other Ambulatory Visit (HOSPITAL_COMMUNITY): Payer: Self-pay

## 2023-11-21 DIAGNOSIS — M150 Primary generalized (osteo)arthritis: Secondary | ICD-10-CM | POA: Diagnosis not present

## 2023-11-21 DIAGNOSIS — Z79891 Long term (current) use of opiate analgesic: Secondary | ICD-10-CM | POA: Diagnosis not present

## 2023-11-21 DIAGNOSIS — G894 Chronic pain syndrome: Secondary | ICD-10-CM | POA: Diagnosis not present

## 2023-11-21 DIAGNOSIS — M47816 Spondylosis without myelopathy or radiculopathy, lumbar region: Secondary | ICD-10-CM | POA: Diagnosis not present

## 2023-11-21 MED ORDER — CYCLOBENZAPRINE HCL 10 MG PO TABS
10.0000 mg | ORAL_TABLET | Freq: Three times a day (TID) | ORAL | 1 refills | Status: AC | PRN
  Filled 2023-11-21: qty 90, 30d supply, fill #0

## 2023-11-21 MED ORDER — GABAPENTIN 300 MG PO CAPS
300.0000 mg | ORAL_CAPSULE | Freq: Every day | ORAL | 3 refills | Status: DC
  Filled 2023-11-21: qty 30, 30d supply, fill #0

## 2023-11-21 MED ORDER — OXYCODONE-ACETAMINOPHEN 10-325 MG PO TABS
1.0000 | ORAL_TABLET | Freq: Every day | ORAL | 0 refills | Status: DC | PRN
  Filled 2023-11-30: qty 150, 30d supply, fill #0

## 2023-11-22 ENCOUNTER — Other Ambulatory Visit (HOSPITAL_COMMUNITY): Payer: Self-pay

## 2023-11-22 ENCOUNTER — Other Ambulatory Visit: Payer: Self-pay

## 2023-11-24 ENCOUNTER — Ambulatory Visit (INDEPENDENT_AMBULATORY_CARE_PROVIDER_SITE_OTHER)

## 2023-11-24 DIAGNOSIS — D510 Vitamin B12 deficiency anemia due to intrinsic factor deficiency: Secondary | ICD-10-CM | POA: Diagnosis not present

## 2023-11-24 DIAGNOSIS — Z7901 Long term (current) use of anticoagulants: Secondary | ICD-10-CM

## 2023-11-24 LAB — POCT INR: INR: 1.7 — AB (ref 2.0–3.0)

## 2023-11-24 MED ORDER — CYANOCOBALAMIN 1000 MCG/ML IJ SOLN
1000.0000 ug | Freq: Once | INTRAMUSCULAR | Status: AC
  Administered 2023-11-24: 1000 ug via INTRAMUSCULAR

## 2023-11-24 NOTE — Progress Notes (Signed)
 Increase dose today to take 1 1/2 tablets and then continue 1 tablet daily. Recheck in 2 weeks.  Per orders of Lauraine Pereyra, NP, in Dr. Sheryll absence , injection of B12 in L deltoid  given by Clotilda Public. Patient tolerated injection well.

## 2023-11-24 NOTE — Patient Instructions (Addendum)
Pre visit review using our clinic review tool, if applicable. No additional management support is needed unless otherwise documented below in the visit note.  Increase dose today to take 1 1/2 tablets and then continue 1 tablet daily. Recheck in 2 weeks.

## 2023-11-28 ENCOUNTER — Telehealth: Payer: Self-pay

## 2023-11-28 ENCOUNTER — Other Ambulatory Visit (HOSPITAL_COMMUNITY): Payer: Self-pay

## 2023-11-28 DIAGNOSIS — Z7901 Long term (current) use of anticoagulants: Secondary | ICD-10-CM

## 2023-11-28 MED ORDER — WARFARIN SODIUM 3 MG PO TABS
ORAL_TABLET | ORAL | 1 refills | Status: AC
  Filled 2023-11-28: qty 105, 90d supply, fill #0
  Filled 2024-03-14: qty 105, 90d supply, fill #1

## 2023-11-28 NOTE — Telephone Encounter (Signed)
 Pt requests a refill of warfarin.   Pt is compliant with warfarin management and PCP apts.  Sent in refill of warfarin to requested pharmacy.

## 2023-11-29 ENCOUNTER — Ambulatory Visit: Admitting: Podiatry

## 2023-11-30 ENCOUNTER — Other Ambulatory Visit: Payer: Self-pay

## 2023-12-01 ENCOUNTER — Other Ambulatory Visit: Payer: Self-pay | Admitting: Internal Medicine

## 2023-12-01 ENCOUNTER — Other Ambulatory Visit (HOSPITAL_COMMUNITY): Payer: Self-pay

## 2023-12-01 DIAGNOSIS — I5042 Chronic combined systolic (congestive) and diastolic (congestive) heart failure: Secondary | ICD-10-CM

## 2023-12-05 ENCOUNTER — Other Ambulatory Visit (HOSPITAL_COMMUNITY): Payer: Self-pay

## 2023-12-05 ENCOUNTER — Other Ambulatory Visit: Payer: Self-pay

## 2023-12-05 ENCOUNTER — Ambulatory Visit: Payer: Self-pay

## 2023-12-05 ENCOUNTER — Other Ambulatory Visit: Payer: Self-pay | Admitting: Internal Medicine

## 2023-12-05 DIAGNOSIS — I5042 Chronic combined systolic (congestive) and diastolic (congestive) heart failure: Secondary | ICD-10-CM

## 2023-12-05 MED ORDER — SPIRONOLACTONE 25 MG PO TABS
25.0000 mg | ORAL_TABLET | Freq: Every day | ORAL | 0 refills | Status: AC
  Filled 2023-12-05: qty 90, 90d supply, fill #0

## 2023-12-05 NOTE — Telephone Encounter (Signed)
 FYI Only or Action Required?: FYI only for provider. - no triage, existing appt. On schedule  Patient was last seen in primary care on 11/02/2023 by Alvia Corean CROME, FNP.  Called Nurse Triage reporting Advice Only.  Triage Disposition: Information or Advice Only Call  Patient/caregiver understands and will follow disposition?: Yes  Copied from CRM #[id number]. Topic: General - Other >> Dec 05, 2023  1:31 PM Berneda FALCON wrote: Reason for CRM: Patient returning NT phone call to [facility]. Called NT line  Copied from CRM [id number]. Topic: Clinical - Red Word Triage >> Dec 05, 2023 11:44 AM Mia F wrote: Red Word that prompted transfer to Nurse Triage: Pt called to schedule a 3-4 month follow up. While asking decision tree questions patient mentioned having pain all over her body., When trying to ask more questions about the pain pt then started mentioning being depressed and having a sever fear of bugs. Patient mentioned how she lost her husband, live alone, only want to see her children and does not connect with others where she live. I could not tell if patient was having a mental crisis or venting. Before I could get any more information about the pain pt said she was experiencing, pt stated I was trying to diagnose her and think I am her doctor when all she want to do is schedule an appt. I informed pt I was allowing her to talk before offering an appt. I scheduled pt as she asked and then pt said she has never been spoke to this way before and she wishes to never speak to me again and hung up. I did not get to get more information about pt pain and could not tell if she should speak with NT for her depression. Please advise Reason for Disposition  General information question, no triage required and triager able to answer question  Answer Assessment - Initial Assessment Questions Patient was sent to Nurse triage for report of symptoms.   Upon talking to this RN, patient reported that she does  not need triage stating that she has chronic issues that are being managed.   She further states that she will go to the ED for any health concerns or contact her mental health provider for any mental health concerns.  Protocols used: Information Only Call - No Triage-A-AH

## 2023-12-05 NOTE — Telephone Encounter (Signed)
 1st attempt to contact patient, no answer, LVMTCB  Copied from CRM [id number]. Topic: Clinical - Red Word Triage >> Dec 05, 2023 11:44 AM Mia F wrote: Red Word that prompted transfer to Nurse Triage: Pt called to schedule a 3-4 month follow up. While asking decision tree questions patient mentioned having pain all over her body., When trying to ask more questions about the pain pt then started mentioning being depressed and having a sever fear of bugs. Patient mentioned how she lost her husband, live alone, only want to see her children and does not connect with others where she live. I could not tell if patient was having a mental crisis or venting. Before I could get any more information about the pain pt said she was experiencing, pt stated I was trying to diagnose her and think I am her doctor when all she want to do is schedule an appt. I informed pt I was allowing her to talk before offering an appt. I scheduled pt as she asked and then pt said she has never been spoke to this way before and she wishes to never speak to me again and hung up. I did not get to get more information about pt pain and could not tell if she should speak with NT for her depression. Please advise

## 2023-12-06 ENCOUNTER — Other Ambulatory Visit: Payer: Self-pay | Admitting: Internal Medicine

## 2023-12-06 ENCOUNTER — Other Ambulatory Visit (HOSPITAL_COMMUNITY): Payer: Self-pay

## 2023-12-06 DIAGNOSIS — F411 Generalized anxiety disorder: Secondary | ICD-10-CM

## 2023-12-07 NOTE — Telephone Encounter (Signed)
 Just an FYI. Patient has been scheduled to see you.

## 2023-12-08 ENCOUNTER — Encounter: Payer: Self-pay | Admitting: Pharmacist

## 2023-12-08 ENCOUNTER — Ambulatory Visit

## 2023-12-08 DIAGNOSIS — Z7901 Long term (current) use of anticoagulants: Secondary | ICD-10-CM | POA: Diagnosis not present

## 2023-12-08 LAB — POCT INR: INR: 1.6 — AB (ref 2.0–3.0)

## 2023-12-08 NOTE — Progress Notes (Signed)
 Pt reports incorporating more vegetables into her diet. Advised to keep these consistent. Pt verbalized understanding.  Increase dose today and tomorrow to take 1 1/2 tablets and then change weekly to take 1 tablet daily expect take 1 1/2 tablets on Wednesday. Recheck in 2 weeks.  Pt due for B12 after 10/19. May get at PCP apt on 9/23.

## 2023-12-08 NOTE — Patient Instructions (Addendum)
 Pre visit review using our clinic review tool, if applicable. No additional management support is needed unless otherwise documented below in the visit note.  Increase dose today and tomorrow to take 1 1/2 tablets and then change weekly to take 1 tablet daily expect take 1 1/2 tablets on Wednesday. Recheck in 2 weeks.

## 2023-12-09 ENCOUNTER — Other Ambulatory Visit (HOSPITAL_COMMUNITY): Payer: Self-pay

## 2023-12-10 MED ORDER — ALPRAZOLAM 1 MG PO TABS
1.0000 mg | ORAL_TABLET | Freq: Three times a day (TID) | ORAL | 0 refills | Status: DC | PRN
  Filled 2023-12-10: qty 270, 90d supply, fill #0

## 2023-12-11 ENCOUNTER — Other Ambulatory Visit: Payer: Self-pay

## 2023-12-11 ENCOUNTER — Other Ambulatory Visit (HOSPITAL_COMMUNITY): Payer: Self-pay

## 2023-12-15 ENCOUNTER — Telehealth: Payer: Self-pay | Admitting: Pharmacy Technician

## 2023-12-15 ENCOUNTER — Other Ambulatory Visit (HOSPITAL_COMMUNITY): Payer: Self-pay

## 2023-12-15 ENCOUNTER — Telehealth: Payer: Self-pay | Admitting: Cardiovascular Disease

## 2023-12-15 NOTE — Telephone Encounter (Signed)
 Pt c/o medication issue:  1. Name of Medication: sacubitril -valsartan  (ENTRESTO ) 97-103 MG   2. How are you currently taking this medication (dosage and times per day)?   Take 1 tablet by mouth 2 (two) times daily.    3. Are you having a reaction (difficulty breathing--STAT)? No   4. What is your medication issue? Pt calling to make office aware she will no longer be eligible for assistance for Entresto  beginning January 26. She would like to know if there is any other assistance that could help her stay on this medication. Please advise.

## 2023-12-15 NOTE — Telephone Encounter (Signed)
 Patient Advocate Encounter   The patient was approved for a [employer] grant that will help cover the cost of Entresto  BRAND Total amount awarded, 7500.00.  Effective: 11/15/23 - 11/13/24   APW:[id number] ERW:[id number] Hmnle:[id number] PI:[id number]  [employer] ID: [id number]   Pharmacy provided with approval and processing information. Patient informed via mychart

## 2023-12-18 ENCOUNTER — Encounter (HOSPITAL_COMMUNITY): Payer: Self-pay

## 2023-12-18 ENCOUNTER — Other Ambulatory Visit (HOSPITAL_COMMUNITY): Payer: Self-pay

## 2023-12-18 MED ORDER — SACUBITRIL-VALSARTAN 97-103 MG PO TABS
1.0000 | ORAL_TABLET | Freq: Two times a day (BID) | ORAL | 3 refills | Status: AC
  Filled 2023-12-18 – 2024-02-24 (×2): qty 60, 30d supply, fill #0
  Filled 2024-03-30 – 2024-04-01 (×2): qty 60, 30d supply, fill #1

## 2023-12-18 NOTE — Telephone Encounter (Signed)
 Refill for Entresto  has been sent pharmacy with DAW1 in the pharmacy notes.

## 2023-12-18 NOTE — Telephone Encounter (Signed)
 Spoke with pt and let her know she has been approved for a grant to help with the Entresto  in 2026. Pt states she will not need a new rx until January because she has enough until then. Will send to our pharmacy team to see if we should go ahead and send rx in with this information or if we need to wait.

## 2023-12-18 NOTE — Addendum Note (Signed)
 Addended by: GORDON RONAL SQUIBB on: 12/18/2023 03:17 PM   Modules accepted: Orders

## 2023-12-20 ENCOUNTER — Telehealth: Payer: Self-pay

## 2023-12-20 ENCOUNTER — Other Ambulatory Visit (HOSPITAL_COMMUNITY): Payer: Self-pay

## 2023-12-20 DIAGNOSIS — Z79891 Long term (current) use of opiate analgesic: Secondary | ICD-10-CM | POA: Diagnosis not present

## 2023-12-20 DIAGNOSIS — M47816 Spondylosis without myelopathy or radiculopathy, lumbar region: Secondary | ICD-10-CM | POA: Diagnosis not present

## 2023-12-20 DIAGNOSIS — G894 Chronic pain syndrome: Secondary | ICD-10-CM | POA: Diagnosis not present

## 2023-12-20 DIAGNOSIS — M150 Primary generalized (osteo)arthritis: Secondary | ICD-10-CM | POA: Diagnosis not present

## 2023-12-20 MED ORDER — OXYCODONE-ACETAMINOPHEN 10-325 MG PO TABS
1.0000 | ORAL_TABLET | Freq: Every day | ORAL | 0 refills | Status: DC | PRN
  Filled 2023-12-28: qty 150, 30d supply, fill #0
  Filled ????-??-??: fill #0

## 2023-12-20 NOTE — Telephone Encounter (Signed)
 Unable to reach patient. Left a detailed message that her medication has arrived to the office and she can come pick it up at her earliest convenience. I have placed her medication in the cabinet above my desk in the middle.

## 2023-12-21 ENCOUNTER — Ambulatory Visit: Admitting: Licensed Clinical Social Worker

## 2023-12-21 ENCOUNTER — Other Ambulatory Visit: Payer: Self-pay | Admitting: Internal Medicine

## 2023-12-21 ENCOUNTER — Other Ambulatory Visit (HOSPITAL_COMMUNITY): Payer: Self-pay

## 2023-12-22 ENCOUNTER — Ambulatory Visit

## 2023-12-22 ENCOUNTER — Other Ambulatory Visit (HOSPITAL_COMMUNITY): Payer: Self-pay

## 2023-12-22 DIAGNOSIS — Z23 Encounter for immunization: Secondary | ICD-10-CM | POA: Diagnosis not present

## 2023-12-22 DIAGNOSIS — Z7901 Long term (current) use of anticoagulants: Secondary | ICD-10-CM | POA: Diagnosis not present

## 2023-12-22 LAB — POCT INR: INR: 2.2 (ref 2.0–3.0)

## 2023-12-22 MED ORDER — MAGNESIUM OXIDE 400 MG PO TABS
1.0000 | ORAL_TABLET | Freq: Two times a day (BID) | ORAL | 0 refills | Status: DC
  Filled 2023-12-22: qty 180, 90d supply, fill #0

## 2023-12-22 NOTE — Progress Notes (Signed)
 Continue 1 tablet daily expect take 1 1/2 tablets on Wednesday. Recheck in 4 weeks.  Pt requested high dose flu vaccine. Administered vaccine. Pt tolerated well.

## 2023-12-22 NOTE — Patient Instructions (Addendum)
 Pre visit review using our clinic review tool, if applicable. No additional management support is needed unless otherwise documented below in the visit note.  Continue 1 tablet daily expect take 1 1/2 tablets on Wednesday. Recheck in 4 weeks.

## 2023-12-25 ENCOUNTER — Other Ambulatory Visit: Payer: Self-pay

## 2023-12-26 ENCOUNTER — Other Ambulatory Visit (HOSPITAL_COMMUNITY): Payer: Self-pay

## 2023-12-26 ENCOUNTER — Other Ambulatory Visit: Payer: Self-pay

## 2023-12-26 DIAGNOSIS — L218 Other seborrheic dermatitis: Secondary | ICD-10-CM | POA: Diagnosis not present

## 2023-12-26 MED ORDER — BETAMETHASONE DIPROPIONATE 0.05 % EX LOTN
TOPICAL_LOTION | CUTANEOUS | 2 refills | Status: AC
  Filled 2023-12-26: qty 60, 30d supply, fill #0

## 2023-12-26 MED ORDER — HYDROCORTISONE 2.5 % EX CREA
TOPICAL_CREAM | CUTANEOUS | 2 refills | Status: AC
  Filled 2023-12-26: qty 30, 30d supply, fill #0

## 2023-12-27 ENCOUNTER — Other Ambulatory Visit (HOSPITAL_COMMUNITY): Payer: Self-pay

## 2023-12-28 ENCOUNTER — Ambulatory Visit: Admitting: Internal Medicine

## 2023-12-28 ENCOUNTER — Other Ambulatory Visit: Payer: Self-pay

## 2023-12-28 ENCOUNTER — Other Ambulatory Visit (HOSPITAL_COMMUNITY): Payer: Self-pay

## 2024-01-02 ENCOUNTER — Telehealth: Payer: Self-pay

## 2024-01-02 NOTE — Telephone Encounter (Signed)
 Gave pt a call pt is coming up due for re-enrollment on Bicares Jardiance  spoke with pt ask to mail out pap and will mail back once she get proof of income from [employer], pt explain she is no longer taking Rubelsus will go over again with provider.pt said she was having side effects in the mornings.

## 2024-01-03 ENCOUNTER — Ambulatory Visit: Admitting: Podiatry

## 2024-01-06 ENCOUNTER — Other Ambulatory Visit: Payer: Self-pay | Admitting: Internal Medicine

## 2024-01-06 ENCOUNTER — Other Ambulatory Visit (HOSPITAL_COMMUNITY): Payer: Self-pay

## 2024-01-06 DIAGNOSIS — E119 Type 2 diabetes mellitus without complications: Secondary | ICD-10-CM

## 2024-01-08 ENCOUNTER — Other Ambulatory Visit (HOSPITAL_COMMUNITY): Payer: Self-pay

## 2024-01-08 ENCOUNTER — Ambulatory Visit: Admitting: Physician Assistant

## 2024-01-08 MED ORDER — TRESIBA FLEXTOUCH 100 UNIT/ML ~~LOC~~ SOPN
30.0000 [IU] | PEN_INJECTOR | Freq: Every day | SUBCUTANEOUS | 0 refills | Status: AC
  Filled 2024-01-08: qty 9, 30d supply, fill #0
  Filled 2024-03-20 (×3): qty 9, 30d supply, fill #1

## 2024-01-16 ENCOUNTER — Ambulatory Visit

## 2024-01-16 ENCOUNTER — Telehealth: Payer: Self-pay

## 2024-01-16 NOTE — Telephone Encounter (Signed)
 Pt reports she cannot make her coumadin  clinic apt. She is requesting to have a lab draw at her PCP apt on 11/17 at 2:40. Advised a msg will be sent to PCP requesting a lab INR be drawn. Advised pt the result would not be back that day so this nurse will contact her on 11/18. Pt verbalized understanding.   Forwarding note to PCP requesting lab INR on 11/17.

## 2024-01-17 ENCOUNTER — Ambulatory Visit: Admitting: Podiatry

## 2024-01-17 ENCOUNTER — Ambulatory Visit: Admitting: Licensed Clinical Social Worker

## 2024-01-18 ENCOUNTER — Other Ambulatory Visit (HOSPITAL_COMMUNITY): Payer: Self-pay

## 2024-01-18 MED ORDER — OXYCODONE-ACETAMINOPHEN 10-325 MG PO TABS
1.0000 | ORAL_TABLET | Freq: Every day | ORAL | 0 refills | Status: AC | PRN
  Filled 2024-01-26: qty 150, 32d supply, fill #0
  Filled 2024-01-27: qty 150, 30d supply, fill #0
  Filled ????-??-??: fill #0

## 2024-01-19 ENCOUNTER — Other Ambulatory Visit: Payer: Self-pay | Admitting: Internal Medicine

## 2024-01-19 ENCOUNTER — Other Ambulatory Visit (HOSPITAL_COMMUNITY): Payer: Self-pay

## 2024-01-19 DIAGNOSIS — I480 Paroxysmal atrial fibrillation: Secondary | ICD-10-CM

## 2024-01-19 DIAGNOSIS — E519 Thiamine deficiency, unspecified: Secondary | ICD-10-CM

## 2024-01-22 ENCOUNTER — Other Ambulatory Visit: Payer: Self-pay | Admitting: Internal Medicine

## 2024-01-22 ENCOUNTER — Other Ambulatory Visit (HOSPITAL_COMMUNITY): Payer: Self-pay

## 2024-01-22 ENCOUNTER — Encounter: Payer: Self-pay | Admitting: Internal Medicine

## 2024-01-22 ENCOUNTER — Ambulatory Visit (INDEPENDENT_AMBULATORY_CARE_PROVIDER_SITE_OTHER): Admitting: Internal Medicine

## 2024-01-22 VITALS — BP 126/78 | HR 62 | Temp 98.1°F | Resp 16 | Ht 60.0 in | Wt 176.2 lb

## 2024-01-22 DIAGNOSIS — I5042 Chronic combined systolic (congestive) and diastolic (congestive) heart failure: Secondary | ICD-10-CM | POA: Diagnosis not present

## 2024-01-22 DIAGNOSIS — E1169 Type 2 diabetes mellitus with other specified complication: Secondary | ICD-10-CM | POA: Diagnosis not present

## 2024-01-22 DIAGNOSIS — Z794 Long term (current) use of insulin: Secondary | ICD-10-CM

## 2024-01-22 DIAGNOSIS — E039 Hypothyroidism, unspecified: Secondary | ICD-10-CM

## 2024-01-22 DIAGNOSIS — E785 Hyperlipidemia, unspecified: Secondary | ICD-10-CM

## 2024-01-22 DIAGNOSIS — E119 Type 2 diabetes mellitus without complications: Secondary | ICD-10-CM

## 2024-01-22 DIAGNOSIS — I25119 Atherosclerotic heart disease of native coronary artery with unspecified angina pectoris: Secondary | ICD-10-CM | POA: Diagnosis not present

## 2024-01-22 DIAGNOSIS — I480 Paroxysmal atrial fibrillation: Secondary | ICD-10-CM

## 2024-01-22 DIAGNOSIS — D510 Vitamin B12 deficiency anemia due to intrinsic factor deficiency: Secondary | ICD-10-CM | POA: Diagnosis not present

## 2024-01-22 DIAGNOSIS — Z7901 Long term (current) use of anticoagulants: Secondary | ICD-10-CM | POA: Diagnosis not present

## 2024-01-22 LAB — BRAIN NATRIURETIC PEPTIDE: Pro B Natriuretic peptide (BNP): 22 pg/mL (ref 0.0–100.0)

## 2024-01-22 LAB — TROPONIN I (HIGH SENSITIVITY): High Sens Troponin I: 3 ng/L (ref 2–17)

## 2024-01-22 NOTE — Progress Notes (Signed)
 Subjective:  Patient ID: Kristen [name], female    DOB: 1945-12-23  Age: 77 y.o. MRN: [id number]  CC: Atrial Fibrillation, Congestive Heart Failure, and Diabetes   HPI Kristen [name] presents for f/up ---  Discussed the use of AI scribe software for clinical note transcription with the patient, who gave verbal consent to proceed.  History of Present Illness Kristen [name] is a 78 year old female with heart failure who presents with worsening shortness of breath and nocturnal dyspnea.  She has experienced shortness of breath for many years, which has worsened over the past two weeks, particularly at night. She describes waking up gasping for breath. She is currently taking Entresto  for her heart condition, which was prescribed due to a previously low ejection fraction that has since improved. No chest pain, but she notes shoulder discomfort. She also reports a sensation of throat soreness, which she attributes to sleeping with her mouth open.  No fluid retention in her legs or feet. However, she mentions numbness in her feet, which she associates with neuropathy. She also reports feeling weak and having difficulty getting out of bed.  She has a history of constipation, which she manages with Colace and Miralax . She attributes some bowel movement difficulties to a vaginal prolapse.   Outpatient Medications Prior to Visit  Medication Sig Dispense Refill   ALPRAZolam  (XANAX ) 1 MG tablet Take 1 tablet (1 mg total) by mouth 3 (three) times daily as needed. 270 tablet 0   atorvastatin  (LIPITOR) 40 MG tablet Take 1 tablet (40 mg total) by mouth daily. 90 tablet 1   betamethasone  dipropionate 0.05 % lotion Apply to scalp once to twice a day as needed. 60 mL 2   Blood Glucose Monitoring Suppl (BLOOD GLUCOSE MONITOR SYSTEM) w/Device KIT Use 1 in the morning, at noon, and at bedtime. 1 kit 0   clotrimazole -betamethasone  (LOTRISONE ) cream Apply 1 Application topically 2  (two) times daily. 30 g 0   Continuous Glucose Sensor (DEXCOM G7 SENSOR) MISC APPLY A SENSOR EVERY 10 DAYS DX:E11.9 9 each PRN   Cyanocobalamin  (VITAMIN DEFICIENCY SYSTEM-B12) 1000 MCG/ML KIT Inject 1,000 mcg as directed every 30 (thirty) days.     cyclobenzaprine  (FLEXERIL ) 10 MG tablet Take 1 tablet (10 mg total) by mouth 3 (three) times daily as needed. 90 tablet 1   diclofenac  Sodium (VOLTAREN ) 1 % GEL Apply 2 g topically 4 (four) times daily. (Patient taking differently: Apply 2 g topically 4 (four) times daily as needed (for pain).) 100 g 0   estradiol  (ESTRACE ) 0.1 MG/GM vaginal cream Place 0.5 g vaginally 2 (two) times a week. 42.5 g 5   furosemide  (LASIX ) 20 MG tablet Take 1 tablet (20 mg total) by mouth daily. 90 tablet 0   gabapentin  (NEURONTIN ) 300 MG capsule Take 1 capsule (300 mg total) by mouth 3 (three) times daily. 270 capsule 1   hydrocortisone  2.5 % cream Apply a small amount to affected area of face and ears twice a day as needed. 30 g 2   hydrocortisone  2.5 % cream Apply to affected area face and ears twice a day as needed 30 g 2   insulin  degludec (TRESIBA  FLEXTOUCH) 100 UNIT/ML FlexTouch Pen Inject 30 Units into the skin daily. 27 mL 0   Insulin  Pen Needle (COMFORT EZ PEN NEEDLES) 31G X 8 MM MISC Use with insulin  pen once daily 50 each 3   JARDIANCE  25 MG TABS tablet Take one tablet by mouth  daily. 90 tablet 1   magnesium  oxide (MAG-OX) 400 MG tablet Take 1 tablet (400 mg total) by mouth 2 (two) times daily. 180 tablet 0   metFORMIN  (GLUCOPHAGE -XR) 500 MG 24 hr tablet Take 4 tablets (2,000 mg total) by mouth daily with breakfast. 120 tablet 2   nitroGLYCERIN  (NITROSTAT ) 0.4 MG SL tablet Place 1 tablet (0.4 mg total) under the tongue every 5 (five) minutes as needed for chest pain (x 3 doses). Reported on 05/08/2015 35 tablet 2   oxyCODONE -acetaminophen  (PERCOCET) 10-325 MG tablet Take 1 tablet by mouth 5 (five) times daily as needed for pain. 150 tablet 0   polyethylene glycol  (MIRALAX  MIX-IN PAX) 17 g packet Take 17 g by mouth daily.     Psyllium (METAMUCIL PO) Take 1 capsule by mouth daily.     sacubitril -valsartan  (ENTRESTO ) 97-103 MG Take 1 tablet by mouth 2 (two) times daily. 180 tablet 3   spironolactone  (ALDACTONE ) 25 MG tablet Take 1 tablet (25 mg total) by mouth daily. 90 tablet 0   warfarin (COUMADIN ) 3 MG tablet TAKE 1 TABLET ((3 MG) BY MOUTH DAILY OR AS DIRECTED BY ANTICOAGULATION CLINIC 105 tablet 1   cyclobenzaprine  (FLEXERIL ) 10 MG tablet Take 10 mg by mouth 3 (three) times daily as needed for muscle spasms.     metoprolol  succinate (TOPROL -XL) 100 MG 24 hr tablet Take 1 tablet (100 mg total) by mouth in the morning and at bedtime. 180 tablet 0   oxyCODONE -acetaminophen  (PERCOCET) 10-325 MG tablet Take 1 tablet by mouth 5 (five) times daily as needed for pain. 150 tablet 0   oxyCODONE -acetaminophen  (PERCOCET) 10-325 MG tablet Take 1 tablet by mouth 5 (five) times daily as needed for pain. 150 tablet 0   oxyCODONE -acetaminophen  (PERCOCET) 10-325 MG tablet Take 1 tablet by mouth 5 (five) times daily as needed for pain. 150 tablet 0   potassium chloride  SA (KLOR-CON  M) 20 MEQ tablet Take 1 tablet (20 mEq total) by mouth in the morning and at bedtime. 180 tablet 0   Semaglutide  (RYBELSUS ) 7 MG TABS Take 1 tablet (7 mg total) by mouth daily. 90 tablet 1   thiamine  (VITAMIN B-1) 50 MG tablet Take 1 tablet (50 mg total) by mouth daily. 90 tablet 0   No facility-administered medications prior to visit.    ROS Review of Systems  Constitutional:  Negative for appetite change, chills, diaphoresis, fatigue, fever and unexpected weight change.  HENT:  Negative for congestion, facial swelling, postnasal drip, rhinorrhea, sinus pressure, sinus pain, sore throat, trouble swallowing and voice change.   Eyes: Negative.   Respiratory:  Positive for shortness of breath. Negative for cough, chest tightness and wheezing.   Cardiovascular:  Negative for chest pain,  palpitations and leg swelling.  Gastrointestinal:  Positive for constipation. Negative for abdominal pain, diarrhea, nausea and rectal pain.  Endocrine: Negative.   Genitourinary: Negative.  Negative for difficulty urinating, dysuria and hematuria.  Musculoskeletal:  Positive for arthralgias. Negative for joint swelling and myalgias.  Skin: Negative.  Negative for color change.  Neurological: Negative.  Negative for dizziness, weakness and light-headedness.  Hematological:  Negative for adenopathy. Does not bruise/bleed easily.  Psychiatric/Behavioral:  Positive for confusion, decreased concentration and dysphoric mood. Negative for agitation, behavioral problems, hallucinations, sleep disturbance and suicidal ideas. The patient is nervous/anxious. The patient is not hyperactive.     Objective:  BP 126/78 (BP Location: Left Arm, Patient Position: Sitting, Cuff Size: Normal)   Pulse 62   Temp 98.1 F (36.7  C) (Oral)   Resp 16   Ht 5' (1.524 m)   Wt 176 lb 3.2 oz (79.9 kg)   SpO2 98%   BMI 34.41 kg/m   BP Readings from Last 3 Encounters:  01/22/24 126/78  11/02/23 112/80  09/28/23 128/76    Wt Readings from Last 3 Encounters:  01/22/24 176 lb 3.2 oz (79.9 kg)  09/28/23 170 lb (77.1 kg)  08/08/23 176 lb 9.6 oz (80.1 kg)    Physical Exam Vitals reviewed.  Constitutional:      General: She is not in acute distress.    Appearance: She is not toxic-appearing or diaphoretic.  HENT:     Nose: Nose normal.     Mouth/Throat:     Mouth: Mucous membranes are moist.  Eyes:     General: No scleral icterus.    Conjunctiva/sclera: Conjunctivae normal.  Cardiovascular:     Rate and Rhythm: Normal rate and regular rhythm.     Heart sounds: No murmur heard.    No friction rub. No gallop.     Comments: EKG--- NSR, 63 bpm LAD LBBB Unchanged  Pulmonary:     Effort: Pulmonary effort is normal.     Breath sounds: No stridor. No wheezing, rhonchi or rales.  Abdominal:     General:  Abdomen is flat. Bowel sounds are normal. There is no distension.     Palpations: There is no hepatomegaly, splenomegaly or mass.     Tenderness: There is no abdominal tenderness. There is no guarding.     Hernia: No hernia is present.  Musculoskeletal:        General: Normal range of motion.     Cervical back: Neck supple.     Right lower leg: No edema.     Left lower leg: No edema.  Skin:    General: Skin is warm and dry.  Neurological:     General: No focal deficit present.     Mental Status: She is alert. Mental status is at baseline.  Psychiatric:        Attention and Perception: She is inattentive.        Mood and Affect: Mood is anxious. Affect is flat.        Speech: Speech is delayed and tangential.        Behavior: Behavior normal. Behavior is cooperative.        Thought Content: Thought content is delusional. Thought content is not paranoid. Thought content does not include homicidal or suicidal ideation. Thought content does not include homicidal or suicidal plan.        Cognition and Memory: Cognition is impaired. Memory is impaired.        Judgment: Judgment normal.     Lab Results  Component Value Date   WBC 4.7 01/22/2024   HGB 13.7 01/22/2024   HCT 40.9 01/22/2024   PLT 330.0 01/22/2024   GLUCOSE 132 (H) 01/22/2024   CHOL 139 01/22/2024   TRIG 108.0 01/22/2024   HDL 41.60 01/22/2024   LDLDIRECT 68 02/15/2023   LDLCALC 76 01/22/2024   ALT 10 01/22/2024   AST 14 01/22/2024   NA 139 01/22/2024   K 4.4 01/22/2024   CL 102 01/22/2024   CREATININE 0.90 01/22/2024   BUN 17 01/22/2024   CO2 22 01/22/2024   TSH 2.31 01/22/2024   INR 3.1 (H) 01/24/2024   HGBA1C 10.4 (H) 01/22/2024   MICROALBUR <0.7 01/22/2024    No results found.  Assessment & Plan:  CHF (congestive  heart failure), NYHA class I, chronic, combined (HCC) -     EKG 12-Lead -     Troponin I (High Sensitivity); Future -     Brain natriuretic peptide; Future -     Ambulatory referral to  Cardiology  Acquired hypothyroidism- She is euthyroid. -     TSH; Future  PAF (paroxysmal atrial fibrillation) (HCC)- She has good R/R control. -     EKG 12-Lead  Chronic combined systolic and diastolic CHF (congestive heart failure) (HCC)- No signs of fluid overload. -     Ambulatory referral to Cardiology  Coronary artery disease involving native coronary artery of native heart with angina pectoris -     Lipid panel; Future -     Troponin I (High Sensitivity); Future -     Brain natriuretic peptide; Future -     Ambulatory referral to Cardiology  Vitamin B12 deficiency anemia due to intrinsic factor deficiency -     CBC with Differential/Platelet; Future -     Folate; Future -     Vitamin B12; Future  Insulin -requiring or dependent type II diabetes mellitus (HCC)- Her A1C is down to 10.4%. -     Basic metabolic panel with GFR; Future -     Urinalysis, Routine w reflex microscopic; Future -     Microalbumin / creatinine urine ratio; Future  Hyperlipidemia associated with type 2 diabetes mellitus (HCC) -     Lipid panel; Future -     TSH; Future -     Hepatic function panel; Future -     Hemoglobin A1c; Future  Long term current use of anticoagulant -     Protime-INR; Future     Follow-up: Return in about 3 months (around 04/23/2024).  Debby Molt, MD

## 2024-01-22 NOTE — Patient Instructions (Signed)

## 2024-01-23 ENCOUNTER — Telehealth: Payer: Self-pay

## 2024-01-23 ENCOUNTER — Other Ambulatory Visit: Payer: Self-pay

## 2024-01-23 ENCOUNTER — Other Ambulatory Visit (HOSPITAL_COMMUNITY): Payer: Self-pay

## 2024-01-23 LAB — LIPID PANEL
Cholesterol: 139 mg/dL (ref 0–200)
HDL: 41.6 mg/dL (ref >39.00)
LDL Cholesterol: 76 mg/dL (ref 0–99)
NonHDL: 97.21
Total CHOL/HDL Ratio: 3
Triglycerides: 108 mg/dL (ref 0.0–149.0)
VLDL: 21.6 mg/dL (ref 0.0–40.0)

## 2024-01-23 LAB — BASIC METABOLIC PANEL WITH GFR
BUN: 17 mg/dL (ref 6–23)
CO2: 22 meq/L (ref 19–32)
Calcium: 9.7 mg/dL (ref 8.4–10.5)
Chloride: 102 meq/L (ref 96–112)
Creatinine, Ser: 0.9 mg/dL (ref 0.40–1.20)
GFR: 62.29 mL/min (ref >60.00)
Glucose, Bld: 132 mg/dL — ABNORMAL HIGH (ref 70–99)
Potassium: 4.4 meq/L (ref 3.5–5.1)
Sodium: 139 meq/L (ref 135–145)

## 2024-01-23 LAB — VITAMIN B12: Vitamin B-12: 319 pg/mL (ref 211–911)

## 2024-01-23 LAB — TSH: TSH: 2.31 u[IU]/mL (ref 0.35–5.50)

## 2024-01-23 LAB — FOLATE: Folate: 13.5 ng/mL (ref >5.9)

## 2024-01-23 LAB — HEPATIC FUNCTION PANEL
ALT: 10 U/L (ref 0–35)
AST: 14 U/L (ref 0–37)
Albumin: 4.2 g/dL (ref 3.5–5.2)
Alkaline Phosphatase: 65 U/L (ref 39–117)
Bilirubin, Direct: 0.1 mg/dL (ref 0.0–0.3)
Total Bilirubin: 0.4 mg/dL (ref 0.2–1.2)
Total Protein: 8 g/dL (ref 6.0–8.3)

## 2024-01-23 MED ORDER — POTASSIUM CHLORIDE CRYS ER 20 MEQ PO TBCR
20.0000 meq | EXTENDED_RELEASE_TABLET | Freq: Two times a day (BID) | ORAL | 0 refills | Status: AC
  Filled 2024-01-23: qty 180, 90d supply, fill #0

## 2024-01-23 NOTE — Telephone Encounter (Signed)
 Pt returned call and reports she will have to try to get transportation for today or tomorrow. She is unable to get in contact with her ride today so it will most likely be tomorrow.

## 2024-01-23 NOTE — Telephone Encounter (Signed)
 Pt had apt with PCP yesterday and requested to have lab INR drawn with other labs she would have. Msg was sent to PCP at time of request and PCP added lab INR order with the other orders yesterday.  Review of chart this morning revealed all labs were drawn except the lab INR. Contacted [facility] this morning and Tammy reported the INR was not drawn and she tried to contact the pt after she had already left to return to the lab but did not get a response.   LVM for pt to return call to coumadin  clinic.

## 2024-01-24 ENCOUNTER — Other Ambulatory Visit: Payer: Self-pay | Admitting: Internal Medicine

## 2024-01-24 ENCOUNTER — Ambulatory Visit: Payer: Self-pay

## 2024-01-24 ENCOUNTER — Other Ambulatory Visit: Payer: Self-pay

## 2024-01-24 ENCOUNTER — Other Ambulatory Visit (HOSPITAL_COMMUNITY): Payer: Self-pay

## 2024-01-24 ENCOUNTER — Ambulatory Visit

## 2024-01-24 DIAGNOSIS — Z7901 Long term (current) use of anticoagulants: Secondary | ICD-10-CM

## 2024-01-24 LAB — CBC WITH DIFFERENTIAL/PLATELET
Basophils Absolute: 0 K/uL (ref 0.0–0.1)
Basophils Relative: 0.8 % (ref 0.0–3.0)
Eosinophils Absolute: 0 K/uL (ref 0.0–0.7)
Eosinophils Relative: 0.8 % (ref 0.0–5.0)
HCT: 40.9 % (ref 36.0–46.0)
Hemoglobin: 13.7 g/dL (ref 12.0–15.0)
Lymphocytes Relative: 49.4 % — ABNORMAL HIGH (ref 12.0–46.0)
Lymphs Abs: 2.3 K/uL (ref 0.7–4.0)
MCHC: 33.5 g/dL (ref 30.0–36.0)
MCV: 88.7 fl (ref 78.0–100.0)
Monocytes Absolute: 0.5 K/uL (ref 0.1–1.0)
Monocytes Relative: 10.3 % (ref 3.0–12.0)
Neutro Abs: 1.8 K/uL (ref 1.4–7.7)
Neutrophils Relative %: 38.7 % — ABNORMAL LOW (ref 43.0–77.0)
Platelets: 330 K/uL (ref 150.0–400.0)
RBC: 4.61 Mil/uL (ref 3.87–5.11)
RDW: 15.9 % — ABNORMAL HIGH (ref 11.5–15.5)
WBC: 4.7 K/uL (ref 4.0–10.5)

## 2024-01-24 LAB — URINALYSIS, ROUTINE W REFLEX MICROSCOPIC
Bilirubin Urine: NEGATIVE
Ketones, ur: NEGATIVE
Leukocytes,Ua: NEGATIVE
Nitrite: POSITIVE — AB
RBC / HPF: NONE SEEN (ref 0–?)
Specific Gravity, Urine: <=1.005 — AB (ref 1.000–1.030)
Total Protein, Urine: NEGATIVE
Urine Glucose: >=1000 — AB
Urobilinogen, UA: 0.2 (ref 0.0–1.0)
pH: 5.5 (ref 5.0–8.0)

## 2024-01-24 LAB — MICROALBUMIN / CREATININE URINE RATIO
Creatinine,U: 46.9 mg/dL
Microalb Creat Ratio: UNDETERMINED mg/g (ref 0.0–30.0)
Microalb, Ur: <0.7 mg/dL

## 2024-01-24 LAB — PROTIME-INR
INR: 3.1 ratio — ABNORMAL HIGH (ref 0.8–1.0)
Prothrombin Time: 30.8 s — ABNORMAL HIGH (ref 9.6–13.1)

## 2024-01-24 MED ORDER — METOPROLOL SUCCINATE ER 100 MG PO TB24
100.0000 mg | ORAL_TABLET | Freq: Two times a day (BID) | ORAL | 0 refills | Status: AC
  Filled 2024-01-24: qty 180, 90d supply, fill #0

## 2024-01-24 MED ORDER — VITAMIN B-1 50 MG PO TABS
50.0000 mg | ORAL_TABLET | Freq: Every day | ORAL | 0 refills | Status: AC
  Filled 2024-01-24: qty 90, 90d supply, fill #0

## 2024-01-24 NOTE — Progress Notes (Signed)
 Pt had lab draw today. Reduce dose today to take 1 tablet and then continue 1 tablet daily expect take 1 1/2 tablets on Wednesday. Recheck in 3 weeks.  Contacted pt and advised of dosing and scheduled next coumadin  clinic apt. Pt verbalized understanding.

## 2024-01-24 NOTE — Patient Instructions (Addendum)
 Pre visit review using our clinic review tool, if applicable. No additional management support is needed unless otherwise documented below in the visit note.  Reduce dose today to take 1 tablet and then continue 1 tablet daily expect take 1 1/2 tablets on Wednesday. Recheck in 3 weeks.

## 2024-01-25 LAB — HEMOGLOBIN A1C: Hgb A1c MFr Bld: 10.4 % — ABNORMAL HIGH (ref 4.6–6.5)

## 2024-01-26 ENCOUNTER — Other Ambulatory Visit (HOSPITAL_COMMUNITY): Payer: Self-pay

## 2024-01-26 ENCOUNTER — Ambulatory Visit: Admitting: Podiatry

## 2024-01-27 ENCOUNTER — Other Ambulatory Visit (HOSPITAL_COMMUNITY): Payer: Self-pay

## 2024-01-29 ENCOUNTER — Other Ambulatory Visit: Payer: Self-pay | Admitting: Internal Medicine

## 2024-01-29 ENCOUNTER — Ambulatory Visit: Payer: Self-pay

## 2024-01-29 ENCOUNTER — Other Ambulatory Visit (HOSPITAL_COMMUNITY): Payer: Self-pay

## 2024-01-29 ENCOUNTER — Other Ambulatory Visit: Payer: Self-pay

## 2024-01-29 ENCOUNTER — Telehealth: Payer: Self-pay

## 2024-01-29 DIAGNOSIS — E1151 Type 2 diabetes mellitus with diabetic peripheral angiopathy without gangrene: Secondary | ICD-10-CM

## 2024-01-29 DIAGNOSIS — N3000 Acute cystitis without hematuria: Secondary | ICD-10-CM | POA: Insufficient documentation

## 2024-01-29 DIAGNOSIS — I5042 Chronic combined systolic (congestive) and diastolic (congestive) heart failure: Secondary | ICD-10-CM

## 2024-01-29 MED ORDER — NITROFURANTOIN MONOHYD MACRO 100 MG PO CAPS
100.0000 mg | ORAL_CAPSULE | Freq: Two times a day (BID) | ORAL | 0 refills | Status: AC
  Filled 2024-01-29: qty 10, 5d supply, fill #0

## 2024-01-29 NOTE — Telephone Encounter (Signed)
Patient is having uti symptoms. 

## 2024-01-29 NOTE — Telephone Encounter (Signed)
 FYI Only or Action Required?: Action required by provider: Pt is requesting an antibiotic for the UTI that provider asked her about. She is now having painful urination sometimes..  In addition pt ha stopped taking rybelsus  d/t nausea. Pt would like a different medication prescribed  Patient was last seen in primary care on 01/22/2024 by Joshua Debby CROME, MD.  Called Nurse Triage reporting Dysuria.  Symptoms began unsure.  Interventions attempted: Nothing.  Symptoms are: unchanged.  Triage Disposition: See Physician Within 24 Hours  Patient/caregiver understands and will follow disposition?: No                        Copied from CRM #[id number]. Topic: Clinical - Red Word Triage >> Jan 29, 2024 12:11 PM Jasmin G wrote: Red Word that prompted transfer to Nurse Triage: Possible UTI that is becoming really painful, pt describes it as a stinging pain. Reason for Disposition  Age > 50 years  Answer Assessment - Initial Assessment Questions 1. SEVERITY: How bad is the pain?  (e.g., Scale 1-10; mild, moderate, or severe)     moderate  3. PATTERN: Is pain present every time you urinate or just sometimes?      Just sometimes 7. CAUSE: What do you think is causing the painful urination?  (e.g., UTI, scratch, Herpes sore)     uti 8. OTHER SYMPTOMS: Do you have any other symptoms? (e.g., blood in urine, flank pain, genital sores, urgency, vaginal discharge)     Pt has stopped taking Rybelsus   Protocols used: Urination Pain - Female-A-AH

## 2024-01-29 NOTE — Telephone Encounter (Signed)
 Copied from CRM #[id number]. Topic: Clinical - Red Word Triage >> Jan 29, 2024 12:11 PM Jasmin G wrote: Red Word that prompted transfer to Nurse Triage: Possible UTI that is becoming really painful, pt describes it as a stinging pain. >> Jan 29, 2024  1:15 PM Rosina BIRCH wrote: Patient called stating she just talked to the triage nurse and she sent a summary to her mychart and it stated she need to see her pcp within 24 hours. Patient does not have transportation to come in for an appointment. Patient stated she is handicap and the lady that takes her places is out of town. Patient stated she wanted her provider to suggest a medication and she does not understand why the provider would send her a diagnosis without giving her something to take for the UTI. Patient stated on Monday the doctor ordered a urine but she did not do the urine until Wednesday. [id number]

## 2024-01-30 ENCOUNTER — Other Ambulatory Visit (HOSPITAL_COMMUNITY): Payer: Self-pay

## 2024-01-30 ENCOUNTER — Other Ambulatory Visit: Payer: Self-pay

## 2024-01-30 ENCOUNTER — Encounter: Payer: Self-pay | Admitting: Pharmacist

## 2024-01-31 ENCOUNTER — Other Ambulatory Visit (HOSPITAL_COMMUNITY): Payer: Self-pay

## 2024-01-31 MED ORDER — FUROSEMIDE 20 MG PO TABS
20.0000 mg | ORAL_TABLET | Freq: Every day | ORAL | 0 refills | Status: AC
  Filled 2024-01-31: qty 90, 90d supply, fill #0

## 2024-02-05 ENCOUNTER — Ambulatory Visit: Payer: Self-pay

## 2024-02-05 ENCOUNTER — Ambulatory Visit

## 2024-02-05 NOTE — Telephone Encounter (Signed)
  FYI Only or Action Required?: Action required by provider: update on patient condition.  Patient was last seen in primary care on 01/22/2024 by Joshua Debby CROME, MD.  Called Nurse Triage reporting Dental Pain.  .  Triage Disposition: Information or Advice Only Call  Patient/caregiver understands and will follow disposition?: Yes  Copied from CRM #[id number]. Topic: Clinical - Red Word Triage >> Feb 05, 2024 11:17 AM Zane F wrote: [facility] that prompted transfer to Nurse Triage:   Concern: Patient is experiencing a lot of pain in her gums  Symptoms:  Pain Swollen at the bottom of her mouth  When did the symptoms start?: About a week ago ( last Wed 01/31/2024)  What have you done to aid in the concern ? Have you taken anything to assist with the matter?: No  Patient has an appointment with Dentist on December 3rd, 2025 to adjust her dentures.  Wanted to let you know I will be transferring you to further discuss your concern. Please be advised the nurse can assist with scheduling. Reason for Disposition  [1] Follow-up call to recent contact AND [2] information only call, no triage required  Answer Assessment - Initial Assessment Questions 1. REASON FOR CALL: What is the main reason for your call? or How can I best help you?     Patient calling with mouth pain to cancel AWV for today due to not feeling like talking because of pain.  Appointment scheduled with dentist for 02/07/2024 for issues.  Declined further triaage 2. SYMPTOMS : Do you have any symptoms?      Mouth/gum pain 3. OTHER QUESTIONS: Do you have any other questions?     N/a  Protocols used: Information Only Call - No Triage-A-AH

## 2024-02-06 NOTE — Telephone Encounter (Signed)
 Called pt in regards to dysuria, pt states she is seeing urogynecology in regards to this and they are handling. No need for further action per pt

## 2024-02-06 NOTE — Telephone Encounter (Signed)
 Called pt and states she is going to dentist to address, no need for further action.

## 2024-02-12 ENCOUNTER — Ambulatory Visit: Admitting: Licensed Clinical Social Worker

## 2024-02-12 NOTE — Telephone Encounter (Signed)
 Received pt pap [facility] (Jardiance ) along proof of income, waiting on provider portion.

## 2024-02-13 ENCOUNTER — Ambulatory Visit

## 2024-02-13 ENCOUNTER — Telehealth: Payer: Self-pay

## 2024-02-13 NOTE — Telephone Encounter (Signed)
 Pt LVM she will need to RS her coumadin  clinic apt today due to transportation difficulties and not feeling well.  LVM for pt to return call to RS.  Apt cancelled for today.

## 2024-02-13 NOTE — Telephone Encounter (Signed)
 Pt returned call and RS apt for 12/12. Pt reported she has hx of vaginal prolapse and it is bothering her a lot the last few days. She is incontinent with this and this distresses her. She will try to get a sooner apt with urogyn. Advised if any other changes to contact the coumadin  clinic. Pt verbalized understanding.

## 2024-02-14 ENCOUNTER — Telehealth: Payer: Self-pay

## 2024-02-14 NOTE — Telephone Encounter (Signed)
 Copied from CRM #[id number]. Topic: Clinical - Medical Advice >> Feb 14, 2024 11:15 AM Rea ORN wrote: Reason for CRM: Pt calling to follow up on what the replacement was supposed to be since she quit taking Rybelsus . Pt advised that she called and spoke to [facility] about this last month but hasn't heard anything back regarding it.   Please call back [phone]

## 2024-02-15 ENCOUNTER — Other Ambulatory Visit (HOSPITAL_COMMUNITY): Payer: Self-pay

## 2024-02-15 MED ORDER — OXYCODONE-ACETAMINOPHEN 10-325 MG PO TABS
1.0000 | ORAL_TABLET | Freq: Every day | ORAL | 0 refills | Status: AC | PRN
  Filled ????-??-?? (×2): fill #0

## 2024-02-16 ENCOUNTER — Other Ambulatory Visit (HOSPITAL_COMMUNITY): Payer: Self-pay

## 2024-02-16 ENCOUNTER — Ambulatory Visit

## 2024-02-16 NOTE — Telephone Encounter (Signed)
 Pt reports she is still having incontinence and cannot make her coumadin  clinic apt today either. RS for next week. Advised if any changes to contact the coumadin  clinic. Pt verbalized understanding.

## 2024-02-16 NOTE — Telephone Encounter (Signed)
 I have spoke with the patient about her medications from what I see in the she states that she will like to speak with Dr. Joshua appointment has been scheduled.

## 2024-02-20 ENCOUNTER — Ambulatory Visit

## 2024-02-20 ENCOUNTER — Other Ambulatory Visit (HOSPITAL_COMMUNITY): Payer: Self-pay

## 2024-02-20 DIAGNOSIS — Z7901 Long term (current) use of anticoagulants: Secondary | ICD-10-CM

## 2024-02-20 LAB — POCT INR: INR: 3.6 — AB (ref 2.0–3.0)

## 2024-02-20 NOTE — Telephone Encounter (Signed)
 Pt called back and asked for me to send a message to ask if Jaz, cma, could relay the information about this medication to Chrissy the pharmacist.

## 2024-02-20 NOTE — Patient Instructions (Addendum)
 Pre visit review using our clinic review tool, if applicable. No additional management support is needed unless otherwise documented below in the visit note.  Hold warfarin today and then change weekly dose to take 1 tablet daily. Recheck in 3 weeks.

## 2024-02-20 NOTE — Progress Notes (Signed)
 Indication: Pafib, stroke Hold warfarin today and then change weekly dose to take 1 tablet daily. Recheck in 3 weeks.

## 2024-02-21 ENCOUNTER — Other Ambulatory Visit: Payer: Self-pay | Admitting: Internal Medicine

## 2024-02-21 ENCOUNTER — Other Ambulatory Visit (HOSPITAL_COMMUNITY): Payer: Self-pay

## 2024-02-21 ENCOUNTER — Ambulatory Visit: Admitting: Podiatry

## 2024-02-21 DIAGNOSIS — I5042 Chronic combined systolic (congestive) and diastolic (congestive) heart failure: Secondary | ICD-10-CM

## 2024-02-21 NOTE — Telephone Encounter (Signed)
 This patient wants me to send this to you. Patient quit taking Rybelsus  and she wants to know what replacement medication she's supposed to be taking. I went over her medication with her and she told me what I was telling her was wrong. Then she asked for an appointment to discuss this with Dr. Joshua I have scheduled that for her also but she wants this brought to your attention and wants you to reach out to her.

## 2024-02-23 ENCOUNTER — Other Ambulatory Visit: Payer: Self-pay

## 2024-02-23 ENCOUNTER — Other Ambulatory Visit (HOSPITAL_COMMUNITY): Payer: Self-pay

## 2024-02-23 ENCOUNTER — Ambulatory Visit: Admitting: Physician Assistant

## 2024-02-23 MED ORDER — TRULICITY 0.75 MG/0.5ML ~~LOC~~ SOAJ: 0.7500 mg | SUBCUTANEOUS | Status: AC

## 2024-02-23 MED ORDER — SPIRONOLACTONE 25 MG PO TABS
25.0000 mg | ORAL_TABLET | Freq: Every day | ORAL | 0 refills | Status: AC
  Filled 2024-02-23: qty 90, 90d supply, fill #0

## 2024-02-23 NOTE — Telephone Encounter (Signed)
 Noted

## 2024-02-23 NOTE — Progress Notes (Signed)
" °  ° °  02/23/2024 Name: Kristen [name] MRN: [id number] DOB: 1945-07-26  Chief Complaint  Patient presents with   Medication Problem    Kristen [name] is a 78 y.o. year old female who presented for a telephone visit.   They were referred to the pharmacist by their PCP for assistance in managing diabetes.   Subjective:  Care Team: Primary Care Provider: Joshua Debby CROME, MD ; Next Scheduled Visit: 03/20/24   Medication Access/Adherence  Current Pharmacy:  [facility] - [facility] [address] [place] [address] Phone: [phone] Fax: [phone]  [facility] - [place], [place] - [address] [place] [address] Phone: [phone] Fax: [phone]  [facility] [address] [place], [place] - [address] [place] [address] Phone: [phone] Fax: [phone]  [facility] - [place], [place] - [address] [place] [address] Phone: [phone] Fax: [phone]   Patient reports affordability concerns with their medications: Yes  Patient reports access/transportation concerns to their pharmacy: No  Patient reports adherence concerns with their medications:  Yes    Diabetes: Current medications: Jardiance  25 mg daily, metformin  XR 500 mg 4 tablets daily, Tresiba  30 units units daily Medications tried in the past: glipizide , Novolog , Levemir , Toujeo , Victoza , Ozempic , Rybelsus  (nausea)  She stopped Rybelsus  due to nausea. She is asking about an alternative to replace the Rybelsus .  She was going to try Dexcom G7 sensor but ended up not wanting to use it.  Current medication access support: Jardiance  PAP  Constipation: Currently taking miralax  1 capful daily Pt reports no BM since Saturday or sunday  Objective:  Lab Results  Component Value Date   HGBA1C 10.4 (H) 01/22/2024    Lab  Results  Component Value Date   CREATININE 0.90 01/22/2024   BUN 17 01/22/2024   NA 139 01/22/2024   K 4.4 01/22/2024   CL 102 01/22/2024   CO2 22 01/22/2024    Lab Results  Component Value Date   CHOL 139 01/22/2024   HDL 41.60 01/22/2024   LDLCALC 76 01/22/2024   LDLDIRECT 68 02/15/2023   TRIG 108.0 01/22/2024   CHOLHDL 3 01/22/2024    Medications Reviewed Today   Medications were not reviewed in this encounter      Assessment/Plan:   Diabetes: - Currently uncontrolled, A1c goal <8% - Continue metformin , Jardiance , and Tresiba . - Options are limited for medications. Can try Trulicity since it is the only GLP1 with PAP at this time. Pt is unable to afford copay for Mounjaro  or Ozempic . If unable to tolerate Trulicity, only other option would be prandial insulin . - Will apply for PAP for Tresiba  and Trulicity. Pt requests these to be mailed to her due to transportation difficulties.  - Jardiance  PAP already in process by [facility].  Follow Up Plan: awaiting PAP completion  Darrelyn Drum, PharmD, BCPS, CPP Clinical Pharmacist Practitioner [facility] [phone]    "

## 2024-02-23 NOTE — Addendum Note (Signed)
 Addended by: MERCEDA FILE R on: 02/23/2024 10:46 AM   Modules accepted: Orders

## 2024-02-24 ENCOUNTER — Other Ambulatory Visit (HOSPITAL_COMMUNITY): Payer: Self-pay

## 2024-02-26 ENCOUNTER — Other Ambulatory Visit: Payer: Self-pay

## 2024-02-27 ENCOUNTER — Other Ambulatory Visit (HOSPITAL_COMMUNITY): Payer: Self-pay

## 2024-02-27 ENCOUNTER — Other Ambulatory Visit: Payer: Self-pay

## 2024-02-27 NOTE — Telephone Encounter (Signed)
 Faxing provider portion, have not received provider portion.

## 2024-03-05 ENCOUNTER — Other Ambulatory Visit (HOSPITAL_COMMUNITY): Payer: Self-pay

## 2024-03-06 ENCOUNTER — Other Ambulatory Visit (HOSPITAL_COMMUNITY): Payer: Self-pay

## 2024-03-06 ENCOUNTER — Ambulatory Visit: Admitting: Podiatry

## 2024-03-06 NOTE — Telephone Encounter (Signed)
 Received provider portion [facility] (Jardiance ) faxed to [facility] along pt portion,proof of income and Ins card.

## 2024-03-09 ENCOUNTER — Other Ambulatory Visit: Payer: Self-pay | Admitting: Internal Medicine

## 2024-03-09 ENCOUNTER — Other Ambulatory Visit (HOSPITAL_COMMUNITY): Payer: Self-pay

## 2024-03-09 DIAGNOSIS — F411 Generalized anxiety disorder: Secondary | ICD-10-CM

## 2024-03-11 ENCOUNTER — Other Ambulatory Visit (HOSPITAL_COMMUNITY): Payer: Self-pay

## 2024-03-11 MED ORDER — ALPRAZOLAM 1 MG PO TABS
1.0000 mg | ORAL_TABLET | Freq: Three times a day (TID) | ORAL | 0 refills | Status: AC | PRN
  Filled 2024-03-11: qty 270, 90d supply, fill #0

## 2024-03-12 ENCOUNTER — Ambulatory Visit: Admitting: Licensed Clinical Social Worker

## 2024-03-14 ENCOUNTER — Other Ambulatory Visit: Payer: Self-pay

## 2024-03-15 ENCOUNTER — Telehealth: Payer: Self-pay

## 2024-03-15 ENCOUNTER — Ambulatory Visit

## 2024-03-15 DIAGNOSIS — Z7901 Long term (current) use of anticoagulants: Secondary | ICD-10-CM

## 2024-03-15 NOTE — Telephone Encounter (Signed)
 Pt reports she will not be able to make her coumadin  clinic apt today due to knee and shoulder pain and difficulty using public transporation. Pt has an apt with PCP Wednesday, 1/14, of next week. Will send msg to PCP requesting a lab INR draw that day so pt can save a trip to the office. Advised pt to make sure she reminds provider and the lab that she needs a lab INR draw. Pt verbalized understanding.  Made note on PCP schedule need for lab INR.

## 2024-03-18 ENCOUNTER — Other Ambulatory Visit: Payer: Self-pay

## 2024-03-18 ENCOUNTER — Encounter: Payer: Self-pay | Admitting: *Deleted

## 2024-03-18 ENCOUNTER — Other Ambulatory Visit (HOSPITAL_COMMUNITY): Payer: Self-pay

## 2024-03-18 ENCOUNTER — Telehealth: Payer: Self-pay | Admitting: Internal Medicine

## 2024-03-18 DIAGNOSIS — Z7901 Long term (current) use of anticoagulants: Secondary | ICD-10-CM

## 2024-03-18 MED ORDER — OXYCODONE-ACETAMINOPHEN 10-325 MG PO TABS
1.0000 | ORAL_TABLET | Freq: Every day | ORAL | 0 refills | Status: AC
  Filled 2024-03-28: qty 150, 30d supply, fill #0

## 2024-03-18 MED ORDER — CYCLOBENZAPRINE HCL 5 MG PO TABS
5.0000 mg | ORAL_TABLET | Freq: Three times a day (TID) | ORAL | 1 refills | Status: AC | PRN
  Filled 2024-03-18: qty 90, 30d supply, fill #0

## 2024-03-18 NOTE — Telephone Encounter (Unsigned)
 Pt LVM she will be near clinic today and is requesting a lab order so she can go to the lab today for INR lab draw. She prefers to do this today instead of her apt with PCP on 1/14.   Placed INR lab order

## 2024-03-18 NOTE — Telephone Encounter (Signed)
 Patient dropped off document Patient Assistance Application, to be filled out by provider. Patient requested to send it back via Call Patient to pick up within 7-days. Document is located in providers tray at front office.Please advise at Mobile [phone] (mobile)

## 2024-03-19 ENCOUNTER — Other Ambulatory Visit (HOSPITAL_COMMUNITY): Payer: Self-pay

## 2024-03-19 ENCOUNTER — Other Ambulatory Visit: Payer: Self-pay

## 2024-03-19 NOTE — Telephone Encounter (Signed)
 No result has been received from lab INR order placed yesterday.  Contacted pt and she reports she was at the clinic yesterday to drop some paperwork off but didn't hear back about the lab order so she wasn't sure if it was authorized.  Advised order was placed yesterday. She will try to come to lab today. Advised this nurse will f/u with her once result is received. Pt verbalized understanding.

## 2024-03-20 ENCOUNTER — Other Ambulatory Visit (HOSPITAL_COMMUNITY): Payer: Self-pay

## 2024-03-20 ENCOUNTER — Telehealth: Payer: Self-pay

## 2024-03-20 ENCOUNTER — Ambulatory Visit: Admitting: Internal Medicine

## 2024-03-20 ENCOUNTER — Other Ambulatory Visit: Payer: Self-pay | Admitting: Internal Medicine

## 2024-03-20 MED ORDER — MAGNESIUM OXIDE 400 MG PO TABS
1.0000 | ORAL_TABLET | Freq: Two times a day (BID) | ORAL | 0 refills | Status: AC
  Filled 2024-03-20: qty 180, 90d supply, fill #0

## 2024-03-20 NOTE — Telephone Encounter (Signed)
 Contacted pt who reports she has transportation for the lab for tomorrow. She reports she will go to the [facility] lab about 2 pm tomorrow. Advised if result is not returned by tomorrow afternoon this nurse will f/u with dosing instructions the following day when result is received. Pt verbalized understanding.

## 2024-03-20 NOTE — Telephone Encounter (Signed)
 Received filled PAP Kristen [name] Trulicity  from [facility] Cristy has submitted to [name] waiting on company decision.

## 2024-03-21 ENCOUNTER — Ambulatory Visit: Admitting: Obstetrics and Gynecology

## 2024-03-21 ENCOUNTER — Encounter: Payer: Self-pay | Admitting: Obstetrics and Gynecology

## 2024-03-21 ENCOUNTER — Other Ambulatory Visit: Payer: Self-pay

## 2024-03-21 VITALS — BP 128/74 | HR 66

## 2024-03-21 DIAGNOSIS — N952 Postmenopausal atrophic vaginitis: Secondary | ICD-10-CM | POA: Diagnosis not present

## 2024-03-21 DIAGNOSIS — R350 Frequency of micturition: Secondary | ICD-10-CM | POA: Diagnosis not present

## 2024-03-21 LAB — POCT URINALYSIS DIP (CLINITEK)
Bilirubin, UA: NEGATIVE
Blood, UA: NEGATIVE
Glucose, UA: 500 mg/dL — AB
Ketones, POC UA: NEGATIVE mg/dL
Leukocytes, UA: NEGATIVE
Nitrite, UA: NEGATIVE
POC PROTEIN,UA: NEGATIVE
Spec Grav, UA: 1.01
Urobilinogen, UA: 0.2 U/dL
pH, UA: 5.5

## 2024-03-21 MED ORDER — ESTRADIOL 0.01 % VA CREA
0.5000 g | TOPICAL_CREAM | VAGINAL | 11 refills | Status: AC
  Filled 2024-03-21: qty 42.5, 90d supply, fill #0

## 2024-03-21 NOTE — Telephone Encounter (Signed)
 Copied from CRM [id number]. Topic: Clinical - Medical Advice >> Mar 21, 2024 11:00 AM Tiffini S wrote: Reason for CRM:  Lela with [employer] Patient Assistance program [phone] called about a application that needs to be completed by the pcp for page 9 and check box on page 8 for the medication approval for Tresiba   A copy of the application will be faxed to the office today

## 2024-03-21 NOTE — Progress Notes (Signed)
 [facility] Return Visit  SUBJECTIVE  History of Present Illness: Kristen [name] is a 79 y.o. female seen in follow-up for recent UTI and concern for pubic hair shedding. Patient has concerns related to seeing her shedding pubic hair and states she feels like they are too long and weird looking. She has photos on her phone of the pubic hair in question being on her pad and in the toilet.   Patient also reports she had a UTI, but her most recent A1c was also elevated (10.4). The urine was not cultured and was treated with antibiotics.   Patient has not been doing estrogen cream due to cost.   Patient also is unable to afford Gemtesa , which was one of the few options for incontinence recommended due to her other medications and age.     Past Medical History: Patient  has a past medical history of Arthritis, Blood transfusion without reported diagnosis, Bursitis, CAD (coronary artery disease), Chronic anticoagulation, DDD (degenerative disc disease), lumbar, Depression, Diabetes mellitus, Diastolic dysfunction, Fibromyalgia, GERD (gastroesophageal reflux disease) (10/23/2003), Headache(784.0), Hyperlipidemia, Hypertension, Hypokalemia, LBBB (left bundle branch block), Lumbar back pain, LV dysfunction, Lymphadenitis, Morbid obesity (HCC), PAF (paroxysmal atrial fibrillation) (HCC), and Stroke (HCC) (2004).   Past Surgical History: She  has a past surgical history that includes Angioplasty (laminectomy); Lumbar laminectomy; Coronary artery bypass graft; Abdominal hysterectomy; Cholecystectomy; Tonsillectomy; Knee arthroscopy; Sphincterotomy; left heart catheterization with coronary/graft angiogram (12/07/2012); and RIGHT/LEFT HEART CATH AND CORONARY/GRAFT ANGIOGRAPHY (N/A, 11/22/2021).   Medications: She has a current medication list which includes the following prescription(s): alprazolam , atorvastatin , betamethasone  dipropionate, blood glucose monitor system,  clotrimazole -betamethasone , dexcom g7 sensor, vitamin deficiency system-b12, cyclobenzaprine , cyclobenzaprine , diclofenac  sodium, trulicity , [START ON 03/25/2024] estradiol , estradiol , furosemide , gabapentin , hydrocortisone , hydrocortisone , tresiba  flextouch, comfort ez pen needles, jardiance , magnesium  oxide, metformin , metoprolol  succinate, nitroglycerin , oxycodone -acetaminophen , oxycodone -acetaminophen , oxycodone -acetaminophen , polyethylene glycol, potassium chloride  sa, psyllium, sacubitril -valsartan , spironolactone , thiamine , and warfarin.   Allergies: Patient is allergic to metformin  and related, cleocin  [clindamycin  hcl], codeine, macrolides and ketolides, morphine, pentazocine lactate, vibramycin [doxycycline calcium ], definity  [perflutren  lipid microsphere], doxycycline, pentazocine, and sulfa antibiotics.   Social History: Patient  reports that she quit smoking about 23 years ago. Her smoking use included cigarettes. She has been exposed to tobacco smoke. She has never used smokeless tobacco. She reports current drug use. Drug: Oxycodone . She reports that she does not drink alcohol.     OBJECTIVE    Lab Results  Component Value Date   COLORU yellow 03/21/2024   CLARITYU clear 03/21/2024   GLUCOSEUR =500 (A) 03/21/2024   BILIRUBINUR negative 03/21/2024   KETONESU negative 05/15/2023   SPECGRAV 1.010 03/21/2024   RBCUR negative 03/21/2024   PHUR 5.5 03/21/2024   PROTEINUR Negative 05/15/2023   PROTEINUR NEGATIVE 05/15/2023   UROBILINOGEN 0.2 03/21/2024   LEUKOCYTESUR Negative 03/21/2024    Physical Exam: Vitals:   03/21/24 1504  BP: 128/74  Pulse: 66   Gen: No apparent distress, A&O x 3.  Detailed Urogynecologic Evaluation:  External vaginal exam normal. Patient has discoloration around the bilateral skin of the anus.    ASSESSMENT AND PLAN    Kristen [name] is a 79 y.o. with:  1. Vaginal atrophy   2. Urinary frequency    Patient reported she can afford the cream  if sent to [facility] pharmacy. This has been sent. I instructed her to use the estrogen cream nightly for two weeks and then twice a week for maintenance.  I encouraged patient to let the office know  if she is eligible to use the [facility] pharmacy, as we could get more coverage for her medications if she was eligible.  Discussed with patient at length that her pubic hairs appeared normal. Her urine today is also clear for signs of infection. Patient reassured that the pubic hairs shedding were also a normal sign of aging.  Encouraged patient to work on blood sugar control to also prevent UTI.   Patient to follow up as needed.    Kristen Rabadi G Joliana Claflin, NP

## 2024-03-21 NOTE — Patient Instructions (Addendum)
 You want to use lotion that has hyaluronic acid which helps penetrate the skin better and keep the moisture locked in.     You do not want to use harsh soaps, especially around the vagina.   Please use estrogen cream twice a week inside the vagina to prevent UTI.   Please let me know if you get approval for [facility] pharmacy. They may cover the medication for your overactive bladder.

## 2024-03-22 ENCOUNTER — Ambulatory Visit

## 2024-03-22 DIAGNOSIS — Z7901 Long term (current) use of anticoagulants: Secondary | ICD-10-CM | POA: Diagnosis not present

## 2024-03-22 DIAGNOSIS — D510 Vitamin B12 deficiency anemia due to intrinsic factor deficiency: Secondary | ICD-10-CM | POA: Diagnosis not present

## 2024-03-22 LAB — POCT INR: INR: 2.2 (ref 2.0–3.0)

## 2024-03-22 MED ORDER — CYANOCOBALAMIN 1000 MCG/ML IJ SOLN
1000.0000 ug | Freq: Once | INTRAMUSCULAR | Status: AC
  Administered 2024-03-22: 1000 ug via INTRAMUSCULAR

## 2024-03-22 NOTE — Patient Instructions (Addendum)
Pre visit review using our clinic review tool, if applicable. No additional management support is needed unless otherwise documented below in the visit note.  Continue 1 tablet daily . Recheck in 4 weeks.  

## 2024-03-22 NOTE — Progress Notes (Signed)
 Indication: Pafib, stroke Continue 1 tablet daily. Recheck in 4 weeks. Per orders of Corean Ku, NP, in Dr. Joshua' absence, injection of B12 given by in L deltoid Clotilda Public. Patient tolerated injection well.

## 2024-03-26 ENCOUNTER — Telehealth: Payer: Self-pay

## 2024-03-26 NOTE — Telephone Encounter (Signed)
 Call [employer] to follow up on pt's application spoke with representative explain provider portion pg#8 was not clear need to re-submit with formulation if it is a vial or flex pen and need to add qty to the application. And just submit pg 8.

## 2024-03-27 ENCOUNTER — Ambulatory Visit: Admitting: Podiatry

## 2024-03-28 ENCOUNTER — Other Ambulatory Visit: Payer: Self-pay

## 2024-03-28 ENCOUNTER — Ambulatory Visit: Admitting: Licensed Clinical Social Worker

## 2024-03-28 ENCOUNTER — Other Ambulatory Visit (HOSPITAL_COMMUNITY): Payer: Self-pay

## 2024-03-29 ENCOUNTER — Other Ambulatory Visit (HOSPITAL_COMMUNITY): Payer: Self-pay

## 2024-03-29 ENCOUNTER — Ambulatory Visit: Admitting: Cardiovascular Disease

## 2024-03-31 ENCOUNTER — Other Ambulatory Visit: Payer: Self-pay

## 2024-03-31 ENCOUNTER — Other Ambulatory Visit (HOSPITAL_COMMUNITY): Payer: Self-pay

## 2024-04-01 ENCOUNTER — Other Ambulatory Visit (HOSPITAL_COMMUNITY): Payer: Self-pay

## 2024-04-02 ENCOUNTER — Ambulatory Visit: Admitting: Physician Assistant

## 2024-04-02 NOTE — Progress Notes (Unsigned)
 "     Kristen Console, PA-C [address] [place], [place]  [address] Phone: [phone]   Primary Care Physician: Joshua Debby CROME, MD  Primary Gastroenterologist:  Kristen Console, PA-C / Norleen Kiang, MD   Chief Complaint: Follow-up constipation       HPI:   Discussed the use of AI scribe software for clinical note transcription with the patient, who gave verbal consent to proceed.  Established patient of Dr. Kiang, returns for follow-up of constipation.  She last saw Dr. Kiang 04/2022 for follow-up.  Was taking MiraLAX  with benefit.      History of Present Illness      Current Outpatient Medications  Medication Sig Dispense Refill   ALPRAZolam  (XANAX ) 1 MG tablet Take 1 tablet (1 mg total) by mouth 3 (three) times daily as needed. 270 tablet 0   atorvastatin  (LIPITOR) 40 MG tablet Take 1 tablet (40 mg total) by mouth daily. 90 tablet 1   betamethasone  dipropionate 0.05 % lotion Apply to scalp once to twice a day as needed. 60 mL 2   Blood Glucose Monitoring Suppl (BLOOD GLUCOSE MONITOR SYSTEM) w/Device KIT Use 1 in the morning, at noon, and at bedtime. 1 kit 0   clotrimazole -betamethasone  (LOTRISONE ) cream Apply 1 Application topically 2 (two) times daily. 30 g 0   Continuous Glucose Sensor (DEXCOM G7 SENSOR) MISC APPLY A SENSOR EVERY 10 DAYS DX:E11.9 9 each PRN   Cyanocobalamin  (VITAMIN DEFICIENCY SYSTEM-B12) 1000 MCG/ML KIT Inject 1,000 mcg as directed every 30 (thirty) days.     cyclobenzaprine  (FLEXERIL ) 10 MG tablet Take 1 tablet (10 mg total) by mouth 3 (three) times daily as needed. 90 tablet 1   cyclobenzaprine  (FLEXERIL ) 5 MG tablet Take 1 tablet (5 mg total) by mouth 3 (three) times daily as needed. 90 tablet 1   diclofenac  Sodium (VOLTAREN ) 1 % GEL Apply 2 g topically 4 (four) times daily. (Patient taking differently: Apply 2 g topically 4 (four) times daily as needed (for pain).) 100 g 0   Dulaglutide  (TRULICITY ) 0.75 MG/0.5ML SOAJ Inject 0.75 mg into the skin once  a week. APPLYING FOR PAP     estradiol  (ESTRACE ) 0.01 % CREA vaginal cream Place 0.5 gram vaginally every night for two weeks then twice a week thereafter 42.5 g 11   estradiol  (ESTRACE ) 0.1 MG/GM vaginal cream Place 0.5 g vaginally 2 (two) times a week. 42.5 g 5   furosemide  (LASIX ) 20 MG tablet Take 1 tablet (20 mg total) by mouth daily. 90 tablet 0   gabapentin  (NEURONTIN ) 300 MG capsule Take 1 capsule (300 mg total) by mouth 3 (three) times daily. 270 capsule 1   hydrocortisone  2.5 % cream Apply a small amount to affected area of face and ears twice a day as needed. 30 g 2   hydrocortisone  2.5 % cream Apply to affected area face and ears twice a day as needed 30 g 2   insulin  degludec (TRESIBA  FLEXTOUCH) 100 UNIT/ML FlexTouch Pen Inject 30 Units into the skin daily. 27 mL 0   Insulin  Pen Needle (COMFORT EZ PEN NEEDLES) 31G X 8 MM MISC Use with insulin  pen once daily 50 each 3   JARDIANCE  25 MG TABS tablet TAKE ONE TABLET BY MOUTH DAILY 90 tablet 2   magnesium  oxide (MAG-OX) 400 MG tablet Take 1 tablet (400 mg total) by mouth 2 (two) times daily. 180 tablet 0   metFORMIN  (GLUCOPHAGE -XR) 500 MG 24 hr tablet Take 4 tablets (2,000 mg total) by  mouth daily with breakfast. 120 tablet 2   metoprolol  succinate (TOPROL -XL) 100 MG 24 hr tablet Take 1 tablet (100 mg total) by mouth in the morning and at bedtime. 180 tablet 0   nitroGLYCERIN  (NITROSTAT ) 0.4 MG SL tablet Place 1 tablet (0.4 mg total) under the tongue every 5 (five) minutes as needed for chest pain (x 3 doses). Reported on 05/08/2015 35 tablet 2   oxyCODONE -acetaminophen  (PERCOCET) 10-325 MG tablet Take 1 tablet by mouth 5 (five) times daily as needed for pain. 150 tablet 0   oxyCODONE -acetaminophen  (PERCOCET) 10-325 MG tablet Take 1 tablet by mouth 5 (five) times daily as needed for pain. 150 tablet 0   oxyCODONE -acetaminophen  (PERCOCET) 10-325 MG tablet Take 1 tablet by mouth 5 (five) times daily as needed for pain. 150 tablet 0    polyethylene glycol (MIRALAX  MIX-IN PAX) 17 g packet Take 17 g by mouth daily.     potassium chloride  SA (KLOR-CON  M) 20 MEQ tablet Take 1 tablet (20 mEq total) by mouth in the morning and at bedtime. 180 tablet 0   Psyllium (METAMUCIL PO) Take 1 capsule by mouth daily.     sacubitril -valsartan  (ENTRESTO ) 97-103 MG Take 1 tablet by mouth 2 (two) times daily. 180 tablet 3   spironolactone  (ALDACTONE ) 25 MG tablet Take 1 tablet (25 mg total) by mouth daily. 90 tablet 0   thiamine  (VITAMIN B-1) 50 MG tablet Take 1 tablet (50 mg total) by mouth daily. 90 tablet 0   warfarin (COUMADIN ) 3 MG tablet TAKE 1 TABLET ((3 MG) BY MOUTH DAILY OR AS DIRECTED BY ANTICOAGULATION CLINIC 105 tablet 1   No current facility-administered medications for this visit.    Allergies as of 04/02/2024 - Review Complete 03/21/2024  Allergen Reaction Noted   Metformin  and related Diarrhea and Other (See Comments) 12/04/2012   Cleocin  [clindamycin  hcl] Diarrhea 08/23/2011   Codeine Itching    Macrolides and ketolides Diarrhea 12/04/2012   Morphine Hives    Pentazocine lactate Itching and Nausea And Vomiting    Vibramycin [doxycycline calcium ] Diarrhea and Rash 08/23/2011   Definity  [perflutren  lipid microsphere] Other (See Comments) 09/02/2014   Doxycycline Diarrhea 11/19/2019   Pentazocine Nausea Only 11/19/2019   Sulfa antibiotics Diarrhea and Rash 11/19/2019    Past Medical History:  Diagnosis Date   Arthritis    Blood transfusion without reported diagnosis    Bursitis    CAD (coronary artery disease)    a. s/p CABG 2004.b. stable cath 2014 demonstrating stable CAD and continued patency of her LIMA graft.   Chronic anticoagulation    on coumadin    DDD (degenerative disc disease), lumbar    Depression    Diabetes mellitus    Diastolic dysfunction    per echo in October 2012 with EF 50 to 55%   Fibromyalgia    GERD (gastroesophageal reflux disease) 10/23/2003   Headache(784.0)    Hyperlipidemia     Hypertension    Hypokalemia    LBBB (left bundle branch block)    Lumbar back pain    LV dysfunction    a. EF 45% by cath 2014. b. EF 50-55% by technically difficult echo in 08/2014.   Lymphadenitis    Morbid obesity (HCC)    a. Sleep study negative for significant OSA in 11/2014.   PAF (paroxysmal atrial fibrillation) (HCC)    Stroke [facility]) 2004   affected speech per pt    Past Surgical History:  Procedure Laterality Date   ABDOMINAL HYSTERECTOMY  ANGIOPLASTY  laminectomy   CHOLECYSTECTOMY     CORONARY ARTERY BYPASS GRAFT     LIMA to LAD    KNEE ARTHROSCOPY     LEFT HEART CATHETERIZATION WITH CORONARY/GRAFT ANGIOGRAM  12/07/2012   Procedure: LEFT HEART CATHETERIZATION WITH EL BILE;  Surgeon: Ozell JONETTA Fell, MD;  Location: [facility] CATH LAB;  Service: Cardiovascular;;   LUMBAR LAMINECTOMY     x3   RIGHT/LEFT HEART CATH AND CORONARY/GRAFT ANGIOGRAPHY N/A 11/22/2021   Procedure: RIGHT/LEFT HEART CATH AND CORONARY/GRAFT ANGIOGRAPHY;  Surgeon: Fell Ozell, MD;  Location[facility] INVASIVE CV LAB;  Service: Cardiovascular;  Laterality: N/A;   SPHINCTEROTOMY     TONSILLECTOMY      Review of Systems:    All systems reviewed and negative except where noted in HPI.    Physical Exam:  There were no vitals taken for this visit. No LMP recorded. Patient has had a hysterectomy.  General: Well-nourished, well-developed in no acute distress.  Lungs: Clear to auscultation bilaterally. Non-labored. Heart: Regular rate and rhythm, no murmurs rubs or gallops.  Abdomen: Bowel sounds are normal; Abdomen is Soft; No hepatosplenomegaly, masses or hernias;  No Abdominal Tenderness; No guarding or rebound tenderness. Neuro: Alert and oriented x 3.  Grossly intact.  Psych: Alert and cooperative, normal mood and affect.   Imaging Studies: No results found.  Labs: CBC    Component Value Date/Time   WBC 4.7 01/22/2024 1606   RBC 4.61 01/22/2024 1606   HGB 13.7 01/22/2024 1606    HGB 12.3 11/01/2021 1506   HCT 40.9 01/22/2024 1606   HCT 38.1 11/01/2021 1506   PLT 330.0 01/22/2024 1606   PLT 309 11/01/2021 1506   MCV 88.7 01/22/2024 1606   MCV 84 11/01/2021 1506   MCH 29.2 03/15/2023 0546   MCHC 33.5 01/22/2024 1606   RDW 15.9 (H) 01/22/2024 1606   RDW 16.1 (H) 11/01/2021 1506   LYMPHSABS 2.3 01/22/2024 1606   MONOABS 0.5 01/22/2024 1606   EOSABS 0.0 01/22/2024 1606   BASOSABS 0.0 01/22/2024 1606    CMP     Component Value Date/Time   NA 139 01/22/2024 1606   NA 142 02/15/2023 1639   K 4.4 01/22/2024 1606   CL 102 01/22/2024 1606   CO2 22 01/22/2024 1606   GLUCOSE 132 (H) 01/22/2024 1606   BUN 17 01/22/2024 1606   BUN 13 02/15/2023 1639   CREATININE 0.90 01/22/2024 1606   CREATININE 0.90 10/16/2019 1503   CALCIUM  9.7 01/22/2024 1606   PROT 8.0 01/22/2024 1606   PROT 7.9 02/15/2023 1639   ALBUMIN 4.2 01/22/2024 1606   ALBUMIN 4.5 02/15/2023 1639   AST 14 01/22/2024 1606   ALT 10 01/22/2024 1606   ALKPHOS 65 01/22/2024 1606   BILITOT 0.4 01/22/2024 1606   BILITOT 0.7 02/15/2023 1639   GFRNONAA >60 03/15/2023 0546   GFRNONAA 64 10/16/2019 1503   GFRAA 75 10/16/2019 1503       Assessment and Plan:   Kristen [name] is a 78 y.o. y/o female ***  Assessment and Plan Assessment & Plan       Kristen Console, PA-C  Follow up ***   "

## 2024-04-19 ENCOUNTER — Ambulatory Visit

## 2024-05-02 ENCOUNTER — Ambulatory Visit: Admitting: Emergency Medicine

## 2024-05-07 ENCOUNTER — Ambulatory Visit: Admitting: Physician Assistant
